# Patient Record
Sex: Male | Born: 1935
Health system: Southern US, Community
[De-identification: ages and names within clinical notes are randomized; demographics above are authoritative.]

## PROBLEM LIST (undated history)

## (undated) DIAGNOSIS — Z950 Presence of cardiac pacemaker: Secondary | ICD-10-CM

## (undated) DIAGNOSIS — N281 Cyst of kidney, acquired: Secondary | ICD-10-CM

## (undated) DIAGNOSIS — N401 Enlarged prostate with lower urinary tract symptoms: Secondary | ICD-10-CM

## (undated) DIAGNOSIS — I2510 Atherosclerotic heart disease of native coronary artery without angina pectoris: Secondary | ICD-10-CM

## (undated) DIAGNOSIS — H9190 Unspecified hearing loss, unspecified ear: Secondary | ICD-10-CM

## (undated) DIAGNOSIS — E119 Type 2 diabetes mellitus without complications: Secondary | ICD-10-CM

## (undated) DIAGNOSIS — R197 Diarrhea, unspecified: Secondary | ICD-10-CM

## (undated) DIAGNOSIS — N179 Acute kidney failure, unspecified: Secondary | ICD-10-CM

## (undated) DIAGNOSIS — K5792 Diverticulitis of intestine, part unspecified, without perforation or abscess without bleeding: Secondary | ICD-10-CM

## (undated) DIAGNOSIS — K219 Gastro-esophageal reflux disease without esophagitis: Secondary | ICD-10-CM

## (undated) DIAGNOSIS — R011 Cardiac murmur, unspecified: Secondary | ICD-10-CM

## (undated) DIAGNOSIS — M1990 Unspecified osteoarthritis, unspecified site: Secondary | ICD-10-CM

## (undated) DIAGNOSIS — D649 Anemia, unspecified: Secondary | ICD-10-CM

## (undated) DIAGNOSIS — E7800 Pure hypercholesterolemia, unspecified: Secondary | ICD-10-CM

## (undated) DIAGNOSIS — M65332 Trigger finger, left middle finger: Secondary | ICD-10-CM

## (undated) DIAGNOSIS — E669 Obesity, unspecified: Secondary | ICD-10-CM

## (undated) DIAGNOSIS — M79603 Pain in arm, unspecified: Secondary | ICD-10-CM

## (undated) DIAGNOSIS — Z973 Presence of spectacles and contact lenses: Secondary | ICD-10-CM

## (undated) DIAGNOSIS — N184 Chronic kidney disease, stage 4 (severe): Secondary | ICD-10-CM

## (undated) DIAGNOSIS — K3532 Acute appendicitis with perforation and localized peritonitis, without abscess: Secondary | ICD-10-CM

## (undated) DIAGNOSIS — I517 Cardiomegaly: Secondary | ICD-10-CM

## (undated) DIAGNOSIS — E1142 Type 2 diabetes mellitus with diabetic polyneuropathy: Secondary | ICD-10-CM

## (undated) DIAGNOSIS — Z95810 Presence of automatic (implantable) cardiac defibrillator: Secondary | ICD-10-CM

## (undated) DIAGNOSIS — M109 Gout, unspecified: Secondary | ICD-10-CM

## (undated) DIAGNOSIS — I442 Atrioventricular block, complete: Secondary | ICD-10-CM

## (undated) DIAGNOSIS — I219 Acute myocardial infarction, unspecified: Secondary | ICD-10-CM

## (undated) DIAGNOSIS — G4730 Sleep apnea, unspecified: Secondary | ICD-10-CM

## (undated) DIAGNOSIS — R040 Epistaxis: Secondary | ICD-10-CM

## (undated) DIAGNOSIS — Z974 Presence of external hearing-aid: Secondary | ICD-10-CM

## (undated) DIAGNOSIS — G459 Transient cerebral ischemic attack, unspecified: Secondary | ICD-10-CM

## (undated) DIAGNOSIS — F32A Depression, unspecified: Secondary | ICD-10-CM

## (undated) DIAGNOSIS — F329 Major depressive disorder, single episode, unspecified: Secondary | ICD-10-CM

## (undated) DIAGNOSIS — J449 Chronic obstructive pulmonary disease, unspecified: Secondary | ICD-10-CM

## (undated) DIAGNOSIS — I255 Ischemic cardiomyopathy: Secondary | ICD-10-CM

## (undated) DIAGNOSIS — Z8719 Personal history of other diseases of the digestive system: Secondary | ICD-10-CM

## (undated) DIAGNOSIS — R0609 Other forms of dyspnea: Secondary | ICD-10-CM

## (undated) DIAGNOSIS — K8020 Calculus of gallbladder without cholecystitis without obstruction: Secondary | ICD-10-CM

## (undated) DIAGNOSIS — G510 Bell's palsy: Secondary | ICD-10-CM

## (undated) DIAGNOSIS — F419 Anxiety disorder, unspecified: Secondary | ICD-10-CM

## (undated) DIAGNOSIS — I5022 Chronic systolic (congestive) heart failure: Secondary | ICD-10-CM

## (undated) DIAGNOSIS — K08109 Complete loss of teeth, unspecified cause, unspecified class: Secondary | ICD-10-CM

## (undated) DIAGNOSIS — I209 Angina pectoris, unspecified: Secondary | ICD-10-CM

## (undated) DIAGNOSIS — R351 Nocturia: Secondary | ICD-10-CM

## (undated) DIAGNOSIS — G629 Polyneuropathy, unspecified: Secondary | ICD-10-CM

## (undated) DIAGNOSIS — Z8619 Personal history of other infectious and parasitic diseases: Secondary | ICD-10-CM

## (undated) DIAGNOSIS — I10 Essential (primary) hypertension: Secondary | ICD-10-CM

## (undated) HISTORY — DX: Major depressive disorder, single episode, unspecified: F32.9

## (undated) HISTORY — DX: Obesity, unspecified: E66.9

## (undated) HISTORY — DX: Sleep apnea, unspecified: G47.30

## (undated) HISTORY — DX: Presence of cardiac pacemaker: Z95.0

## (undated) HISTORY — PX: UPPER GI ENDOSCOPY: SHX6162

## (undated) HISTORY — DX: Essential (primary) hypertension: I10

## (undated) HISTORY — PX: APPENDECTOMY: SHX54

## (undated) HISTORY — DX: Chronic obstructive pulmonary disease, unspecified: J44.9

## (undated) HISTORY — DX: Atrioventricular block, complete: I44.2

## (undated) HISTORY — PX: DENTAL SURGERY: SHX609

## (undated) HISTORY — PX: TONSILLECTOMY: SUR1361

## (undated) HISTORY — DX: Pure hypercholesterolemia, unspecified: E78.00

## (undated) HISTORY — DX: Bell's palsy: G51.0

## (undated) HISTORY — PX: CATARACT EXTRACTION W/ INTRAOCULAR LENS  IMPLANT, BILATERAL: SHX1307

## (undated) HISTORY — PX: COLONOSCOPY: SHX174

## (undated) HISTORY — DX: Atherosclerotic heart disease of native coronary artery without angina pectoris: I25.10

## (undated) HISTORY — DX: Depression, unspecified: F32.A

---

## 1975-07-14 HISTORY — PX: HIATAL HERNIA REPAIR: SHX195

## 1993-07-13 HISTORY — PX: CARDIAC CATHETERIZATION: SHX172

## 2005-10-15 ENCOUNTER — Ambulatory Visit (HOSPITAL_COMMUNITY): Admission: RE | Admit: 2005-10-15 | Discharge: 2005-10-15 | Payer: Self-pay | Admitting: Family Medicine

## 2008-07-13 HISTORY — PX: INSERT / REPLACE / REMOVE PACEMAKER: SUR710

## 2008-07-23 ENCOUNTER — Inpatient Hospital Stay (HOSPITAL_COMMUNITY): Admission: EM | Admit: 2008-07-23 | Discharge: 2008-07-25 | Payer: Self-pay | Admitting: Emergency Medicine

## 2008-09-17 ENCOUNTER — Ambulatory Visit (HOSPITAL_COMMUNITY): Admission: RE | Admit: 2008-09-17 | Discharge: 2008-09-17 | Payer: Self-pay | Admitting: Cardiology

## 2008-09-17 ENCOUNTER — Encounter: Admission: RE | Admit: 2008-09-17 | Discharge: 2008-09-17 | Payer: Self-pay | Admitting: Cardiology

## 2008-09-24 ENCOUNTER — Ambulatory Visit: Admission: RE | Admit: 2008-09-24 | Discharge: 2008-09-24 | Payer: Self-pay | Admitting: Cardiology

## 2008-09-24 LAB — PULMONARY FUNCTION TEST

## 2010-04-05 ENCOUNTER — Encounter: Payer: Self-pay | Admitting: Internal Medicine

## 2010-04-06 ENCOUNTER — Ambulatory Visit: Payer: Self-pay | Admitting: Family Medicine

## 2010-04-06 ENCOUNTER — Inpatient Hospital Stay (HOSPITAL_COMMUNITY): Admission: EM | Admit: 2010-04-06 | Discharge: 2010-04-08 | Payer: Self-pay | Admitting: Emergency Medicine

## 2010-04-06 ENCOUNTER — Ambulatory Visit: Payer: Self-pay | Admitting: Cardiovascular Disease

## 2010-04-07 ENCOUNTER — Ambulatory Visit: Payer: Self-pay | Admitting: Vascular Surgery

## 2010-04-07 ENCOUNTER — Encounter: Payer: Self-pay | Admitting: Family Medicine

## 2010-05-06 ENCOUNTER — Encounter (INDEPENDENT_AMBULATORY_CARE_PROVIDER_SITE_OTHER): Payer: Self-pay | Admitting: *Deleted

## 2010-05-15 ENCOUNTER — Telehealth (INDEPENDENT_AMBULATORY_CARE_PROVIDER_SITE_OTHER): Payer: Self-pay | Admitting: *Deleted

## 2010-05-26 ENCOUNTER — Encounter
Admission: RE | Admit: 2010-05-26 | Discharge: 2010-05-26 | Payer: Self-pay | Source: Home / Self Care | Attending: Internal Medicine | Admitting: Internal Medicine

## 2010-06-02 ENCOUNTER — Encounter (INDEPENDENT_AMBULATORY_CARE_PROVIDER_SITE_OTHER): Payer: Self-pay | Admitting: *Deleted

## 2010-06-06 ENCOUNTER — Telehealth (INDEPENDENT_AMBULATORY_CARE_PROVIDER_SITE_OTHER): Payer: Self-pay | Admitting: *Deleted

## 2010-07-23 ENCOUNTER — Encounter (INDEPENDENT_AMBULATORY_CARE_PROVIDER_SITE_OTHER): Payer: Self-pay | Admitting: *Deleted

## 2010-07-29 ENCOUNTER — Encounter: Payer: Self-pay | Admitting: Internal Medicine

## 2010-08-03 ENCOUNTER — Encounter: Payer: Self-pay | Admitting: Family Medicine

## 2010-08-03 ENCOUNTER — Encounter: Payer: Self-pay | Admitting: Internal Medicine

## 2010-08-03 ENCOUNTER — Encounter: Payer: Self-pay | Admitting: Cardiology

## 2010-08-12 NOTE — Progress Notes (Signed)
  Phone Note Call from Patient   Caller: Patient Summary of Call: last message wrong pt, he does not live in [place], this pt doesn't want to leave dr [name] lela expalined that's not the case that it's only for the device clinic, still didn't understand so she transferred him to dr jordan's office so they could explain, and hoepfully pt or dr Doug Sou office will call to schedule Initial call taken by: Lorenda Hatchet,  May 15, 2010 3:20 PM

## 2010-08-12 NOTE — Letter (Signed)
Summary: Appointment - Missed  [facility], [place]  [address] [place]   [place], [place] [address]   Phone: [phone]  Fax: [phone]     May 06, 2010 MRN: [id number]   CALAN OTANEZ [address] [place], [place]  [address]   Dear Mr. KUREK,  [facility] records indicate you missed your appointment on 04-21-10  with Dr.  Lovena Le.                                    It is very important that we reach you to reschedule this appointment. We look forward to participating in your health care needs. Please contact us at the number listed above at your earliest convenience to reschedule this appointment.     Sincerely,    Public relations account executive

## 2010-08-12 NOTE — Progress Notes (Signed)
  Phone Note Call from Patient   Caller: Patient Summary of Call: PT CALLED IN RESPONSE TO THE LETTER RE MISSED APPT FOR [facility], HE WANTS TO CALL HIS INSURANCE CO AND FIND OUT EXACTLY HOW MUCH HE WILL OWE, THEN CALL TO SCHEDULE Initial call taken by: Lorenda Hatchet,  June 06, 2010 2:58 PM

## 2010-08-12 NOTE — Miscellaneous (Signed)
Summary: Device preload  Clinical Lists Changes  Observations: Added new observation of PPM INDICATN: CHB (04/05/2010 13:25) Added new observation of MAGNET RTE: BOL 85 ERI 65 (04/05/2010 13:25) Added new observation of PPMLEADSTAT2: active (04/05/2010 13:25) Added new observation of PPMLEADSER2: [id number] (04/05/2010 13:25) Added new observation of PPMLEADMOD2: 5076  (04/05/2010 13:25) Added new observation of PPMLEADDOI2: 07/24/2008  (04/05/2010 13:25) Added new observation of PPMLEADLOC2: RV  (04/05/2010 13:25) Added new observation of PPMLEADSTAT1: active  (04/05/2010 13:25) Added new observation of PPMLEADSER1: [id number]  (04/05/2010 13:25) Added new observation of PPMLEADMOD1: 5076  (04/05/2010 13:25) Added new observation of PPMLEADDOI1: 07/24/2008  (04/05/2010 13:25) Added new observation of PPMLEADLOC1: RA  (04/05/2010 13:25) Added new observation of PPM IMP MD: Sinda Du  (04/05/2010 13:25) Added new observation of PPM DOI: 07/24/2008  (04/05/2010 13:25) Added new observation of PPM SERL#: [id number]  (04/05/2010 13:25) Added new observation of PPM MODL#: [id number]  (04/05/2010 13:25) Added new observation of PACEMAKERMFG: Medtronic  (04/05/2010 13:25) Added new observation of PPM REFER MD: Geanie Cooley  (04/05/2010 13:25) Added new observation of PACEMAKER MD: Virl Axe, MD  (04/05/2010 13:25)      PPM Specifications Following MD:  Virl Axe, MD     Referring MD:  Geanie Cooley PPM Vendor:  Medtronic     PPM Model Number:  [id number]     PPM Serial Number:  [id number] PPM DOI:  07/24/2008     PPM Implanting MD:  Sinda Du  Lead 1    Location: RA     DOI: 07/24/2008     Model #: [id number]     Serial #: [id number]     Status: active Lead 2    Location: RV     DOI: 07/24/2008     Model #: [id number]     Serial #: [id number]     Status: active  Magnet Response Rate:  BOL 85 ERI 65  Indications:  CHB

## 2010-08-12 NOTE — Letter (Signed)
Summary: Appointment - Missed  [facility], [place]  [address] [place]   [place], [place] [address]   Phone: [phone]  Fax: [phone]     June 02, 2010 MRN: [id number]   Rodney Farley [address] [place], [place]  [address]   Dear Mr. LALLO,  [facility] records indicate you missed your appointment on 04-21-10  with the [facility]. It is very important that we reach you to reschedule this appointment. We look forward to participating in your health care needs, as we are treating Dr. Doug Sou device patients here at our office now, they will not be doing them at his office any longer. Please contact us at the number listed above at your earliest convenience to reschedule this appointment.     Sincerely,    Public relations account executive

## 2010-08-12 NOTE — Progress Notes (Signed)
  Phone Note Call from Patient   Caller: Patient Summary of Call: pt called back re missed appt on 10-10 for device clinic appt, pt has moved to [facility] Initial call taken by: Lorenda Hatchet,  May 15, 2010 3:11 PM

## 2010-08-14 NOTE — Letter (Signed)
Summary: Device-Delinquent Check  [facility], [place] [address] [place]   [place], [place] [address]   Phone: [phone]  Fax: [phone]     July 23, 2010 MRN: [id number]   JAIVEN SANDUSKY [address] [place], [place]  [address]   Dear Mr. PIZARRO,  According to our records, you have not had your implanted device checked in the recommended period of time.  We are unable to determine appropriate device function without checking your device on a regular basis.  Please call our office to schedule an appointment, with Dr Caryl Comes,  as soon as possible.  If you are having your device checked by another physician, please call us so that we may update our records.  Thank you,  Gasper Sells, EMT  July 23, 2010 4:27 PM  [facility]  certified

## 2010-08-14 NOTE — Cardiovascular Report (Signed)
Summary: Certified Letter Signed - Patient (No Appt. No Response)  Certified Letter Signed - Patient (No Appt. No Response)   Imported By: Ranell Patrick 08/08/2010 12:19:35  _____________________________________________________________________  External Attachment:    Type:   Image     Comment:   External Document

## 2010-09-25 LAB — COMPREHENSIVE METABOLIC PANEL
AST: 29 U/L (ref 0–37)
Creatinine, Ser: 1.35 mg/dL (ref 0.4–1.5)
Sodium: 134 mEq/L — ABNORMAL LOW (ref 135–145)
Total Bilirubin: 0.5 mg/dL (ref 0.3–1.2)

## 2010-09-25 LAB — GLUCOSE, CAPILLARY
Glucose-Capillary: 192 mg/dL — ABNORMAL HIGH (ref 70–99)
Glucose-Capillary: 257 mg/dL — ABNORMAL HIGH (ref 70–99)
Glucose-Capillary: 326 mg/dL — ABNORMAL HIGH (ref 70–99)
Glucose-Capillary: 414 mg/dL — ABNORMAL HIGH (ref 70–99)

## 2010-09-25 LAB — DIFFERENTIAL
Basophils Relative: 1 % (ref 0–1)
Eosinophils Absolute: 0.2 10*3/uL (ref 0.0–0.7)
Eosinophils Relative: 2 % (ref 0–5)
Lymphocytes Relative: 33 % (ref 12–46)
Lymphs Abs: 2.3 10*3/uL (ref 0.7–4.0)
Monocytes Relative: 8 % (ref 3–12)
Neutrophils Relative %: 56 % (ref 43–77)

## 2010-09-25 LAB — URINALYSIS, ROUTINE W REFLEX MICROSCOPIC
Glucose, UA: >1000 mg/dL — AB
Leukocytes, UA: NEGATIVE
Protein, ur: 100 mg/dL — AB
Specific Gravity, Urine: 1.025 (ref 1.005–1.030)
Urobilinogen, UA: 0.2 mg/dL (ref 0.0–1.0)

## 2010-09-25 LAB — APTT: aPTT: 28 seconds (ref 24–37)

## 2010-09-25 LAB — BASIC METABOLIC PANEL
CO2: 24 mEq/L (ref 19–32)
Calcium: 9.5 mg/dL (ref 8.4–10.5)
GFR calc Af Amer: 59 mL/min — ABNORMAL LOW (ref >60)
Potassium: 4.9 mEq/L (ref 3.5–5.1)

## 2010-09-25 LAB — LIPID PANEL
HDL: 44 mg/dL (ref >39)
Total CHOL/HDL Ratio: 5.2 RATIO
Triglycerides: 452 mg/dL — ABNORMAL HIGH (ref <150)
VLDL: UNDETERMINED mg/dL (ref 0–40)

## 2010-09-25 LAB — CBC
HCT: 39.8 % (ref 39.0–52.0)
MCH: 31.3 pg (ref 26.0–34.0)
MCHC: 34.7 g/dL (ref 30.0–36.0)
MCV: 89.4 fL (ref 78.0–100.0)
MCV: 90.2 fL (ref 78.0–100.0)
Platelets: 206 10*3/uL (ref 150–400)
Platelets: 215 10*3/uL (ref 150–400)
RBC: 4.41 MIL/uL (ref 4.22–5.81)
RDW: 12.8 % (ref 11.5–15.5)

## 2010-09-25 LAB — CARDIAC PANEL(CRET KIN+CKTOT+MB+TROPI)
CK, MB: 3.1 ng/mL (ref 0.3–4.0)
Total CK: 125 U/L (ref 7–232)

## 2010-09-25 LAB — CK TOTAL AND CKMB (NOT AT ARMC)
Relative Index: 3.2 — ABNORMAL HIGH (ref 0.0–2.5)
Total CK: 130 U/L (ref 7–232)

## 2010-09-25 LAB — HEMOGLOBIN A1C: Hgb A1c MFr Bld: 12.5 % — ABNORMAL HIGH (ref <5.7)

## 2010-09-25 LAB — URINE MICROSCOPIC-ADD ON

## 2010-10-27 LAB — COMPREHENSIVE METABOLIC PANEL
ALT: 60 U/L — ABNORMAL HIGH (ref 0–53)
Alkaline Phosphatase: 40 U/L (ref 39–117)
BUN: 33 mg/dL — ABNORMAL HIGH (ref 6–23)
CO2: 25 mEq/L (ref 19–32)
Chloride: 105 mEq/L (ref 96–112)
GFR calc non Af Amer: 38 mL/min — ABNORMAL LOW (ref >60)
Glucose, Bld: 305 mg/dL — ABNORMAL HIGH (ref 70–99)
Potassium: 5.1 mEq/L (ref 3.5–5.1)
Total Bilirubin: 0.8 mg/dL (ref 0.3–1.2)
Total Protein: 6.3 g/dL (ref 6.0–8.3)

## 2010-10-27 LAB — BASIC METABOLIC PANEL
CO2: 23 mEq/L (ref 19–32)
CO2: 27 mEq/L (ref 19–32)
Chloride: 102 mEq/L (ref 96–112)
Chloride: 104 mEq/L (ref 96–112)
Creatinine, Ser: 1.94 mg/dL — ABNORMAL HIGH (ref 0.4–1.5)
GFR calc Af Amer: 41 mL/min — ABNORMAL LOW (ref >60)
GFR calc Af Amer: 57 mL/min — ABNORMAL LOW (ref >60)
Potassium: 3.8 mEq/L (ref 3.5–5.1)
Sodium: 135 mEq/L (ref 135–145)
Sodium: 136 mEq/L (ref 135–145)

## 2010-10-27 LAB — HEMOGLOBIN A1C
Hgb A1c MFr Bld: 7.3 % — ABNORMAL HIGH (ref 4.6–6.1)
Mean Plasma Glucose: 163 mg/dL

## 2010-10-27 LAB — BASIC METABOLIC PANEL WITH GFR
BUN: 33 mg/dL — ABNORMAL HIGH (ref 6–23)
Calcium: 9.3 mg/dL (ref 8.4–10.5)
GFR calc non Af Amer: 34 mL/min — ABNORMAL LOW (ref >60)
Glucose, Bld: 388 mg/dL — ABNORMAL HIGH (ref 70–99)
Potassium: 5 meq/L (ref 3.5–5.1)

## 2010-10-27 LAB — COMPREHENSIVE METABOLIC PANEL WITH GFR
AST: 53 U/L — ABNORMAL HIGH (ref 0–37)
Albumin: 3.7 g/dL (ref 3.5–5.2)
Calcium: 9.5 mg/dL (ref 8.4–10.5)
Creatinine, Ser: 1.78 mg/dL — ABNORMAL HIGH (ref 0.4–1.5)
GFR calc Af Amer: 45 mL/min — ABNORMAL LOW (ref >60)
Sodium: 137 meq/L (ref 135–145)

## 2010-10-27 LAB — POCT I-STAT, CHEM 8
BUN: 37 mg/dL — ABNORMAL HIGH (ref 6–23)
Calcium, Ion: 1.24 mmol/L (ref 1.12–1.32)
Chloride: 104 meq/L (ref 96–112)
Creatinine, Ser: 1.9 mg/dL — ABNORMAL HIGH (ref 0.4–1.5)
Glucose, Bld: 374 mg/dL — ABNORMAL HIGH (ref 70–99)
HCT: 42 % (ref 39.0–52.0)
Hemoglobin: 14.3 g/dL (ref 13.0–17.0)
Potassium: 5.1 meq/L (ref 3.5–5.1)
Sodium: 136 meq/L (ref 135–145)
TCO2: 22 mmol/L (ref 0–100)

## 2010-10-27 LAB — DIFFERENTIAL
Basophils Absolute: 0.1 10*3/uL (ref 0.0–0.1)
Basophils Relative: 1 % (ref 0–1)
Eosinophils Absolute: 0.5 K/uL (ref 0.0–0.7)
Eosinophils Relative: 6 % — ABNORMAL HIGH (ref 0–5)
Lymphocytes Relative: 39 % (ref 12–46)
Lymphs Abs: 3.5 K/uL (ref 0.7–4.0)
Monocytes Absolute: 0.7 10*3/uL (ref 0.1–1.0)
Monocytes Relative: 8 % (ref 3–12)
Neutro Abs: 4.1 10*3/uL (ref 1.7–7.7)
Neutrophils Relative %: 47 % (ref 43–77)

## 2010-10-27 LAB — CBC
HCT: 40.7 % (ref 39.0–52.0)
Hemoglobin: 13.6 g/dL (ref 13.0–17.0)
MCHC: 33.3 g/dL (ref 30.0–36.0)
MCV: 95.9 fL (ref 78.0–100.0)
Platelets: 190 K/uL (ref 150–400)
RBC: 4.25 MIL/uL (ref 4.22–5.81)
RDW: 13.5 % (ref 11.5–15.5)
WBC: 8.8 10*3/uL (ref 4.0–10.5)

## 2010-10-27 LAB — GLUCOSE, CAPILLARY
Glucose-Capillary: 117 mg/dL — ABNORMAL HIGH (ref 70–99)
Glucose-Capillary: 129 mg/dL — ABNORMAL HIGH (ref 70–99)
Glucose-Capillary: 137 mg/dL — ABNORMAL HIGH (ref 70–99)
Glucose-Capillary: 153 mg/dL — ABNORMAL HIGH (ref 70–99)
Glucose-Capillary: 154 mg/dL — ABNORMAL HIGH (ref 70–99)
Glucose-Capillary: 162 mg/dL — ABNORMAL HIGH (ref 70–99)
Glucose-Capillary: 208 mg/dL — ABNORMAL HIGH (ref 70–99)
Glucose-Capillary: 285 mg/dL — ABNORMAL HIGH (ref 70–99)

## 2010-10-27 LAB — TSH: TSH: 1.364 u[IU]/mL (ref 0.350–4.500)

## 2010-10-27 LAB — CK TOTAL AND CKMB (NOT AT ARMC)
CK, MB: 3.7 ng/mL (ref 0.3–4.0)
Relative Index: 2.8 — ABNORMAL HIGH (ref 0.0–2.5)
Total CK: 130 U/L (ref 7–232)

## 2010-10-27 LAB — MAGNESIUM: Magnesium: 1.7 mg/dL (ref 1.5–2.5)

## 2010-10-27 LAB — PROTIME-INR
INR: 1 (ref 0.00–1.49)
Prothrombin Time: 13.4 seconds (ref 11.6–15.2)

## 2010-10-27 LAB — TROPONIN I: Troponin I: 0.02 ng/mL (ref 0.00–0.06)

## 2010-10-27 LAB — LIPID PANEL
Triglycerides: 435 mg/dL — ABNORMAL HIGH (ref <150)
VLDL: UNDETERMINED mg/dL (ref 0–40)

## 2010-10-27 LAB — CARDIAC PANEL(CRET KIN+CKTOT+MB+TROPI)
CK, MB: 6.3 ng/mL — ABNORMAL HIGH (ref 0.3–4.0)
Total CK: 182 U/L (ref 7–232)
Troponin I: 0.01 ng/mL (ref 0.00–0.06)

## 2010-10-27 LAB — APTT: aPTT: 27 seconds (ref 24–37)

## 2010-11-13 ENCOUNTER — Encounter: Payer: Self-pay | Admitting: *Deleted

## 2010-11-25 NOTE — Cardiovascular Report (Signed)
NAME:  Rodney Farley, SWENSON NO.:  [id number]   MEDICAL RECORD NO.:  [id number]          PATIENT TYPE:  INP   LOCATION:  2807                         FACILITY:  [facility]   PHYSICIAN:  Ercil M. [name], M.D.  DATE OF BIRTH:  March 14, 1932   DATE OF PROCEDURE:  07/23/2008  DATE OF DISCHARGE:                            CARDIAC CATHETERIZATION   INDICATIONS FOR PROCEDURE:  A 77 year old white male presented with  syncope and complete heart block with a rate of 20 beats per minute.  Access is via the right femoral vein using the standard Seldinger  technique equipment, 6-French venous sheath, and 5-French balloon-tipped  temporary pacemaker wire.   MEDICATIONS:  Local anesthesia with 1% Xylocaine.   PROCEDURE NOTE:  After the patient was sterilely prepped and draped, his  right groin was anesthetized with lidocaine.  We introduced a femoral  venous sheath.  Under fluoroscopic guidance, the temporary pacing wire  was directed into the right ventricular apex.  This demonstrated good  capture with down to 2 mA.  The patient was left with a rate of 70 beats  per minute and output of 6 mA on his temporary pacemaker.  He tolerated  procedure well without complications.   INTERPRETATION:  Successful insertion of temporary transvenous  pacemaker.           ______________________________  Alize M. [name], M.D.     PMJ/MEDQ  D:  07/23/2008  T:  07/24/2008  Job:  [id number]

## 2010-11-25 NOTE — H&P (Signed)
NAME:  Rodney Farley, Rodney Farley NO.:  [id number]   MEDICAL RECORD NO.:  [id number]          PATIENT TYPE:  INP   LOCATION:  2908                         FACILITY:  [facility]   PHYSICIAN:  Linden M. [name], M.D.  DATE OF BIRTH:  [birth year]   DATE OF ADMISSION:  07/23/2008  DATE OF DISCHARGE:                              HISTORY & PHYSICAL   HISTORY OF PRESENT ILLNESS:  Rodney Farley is a 77 year old white male  who has a known history of coronary disease.  He apparently suffered two  myocardial infarctions in 1995 and was treated with angioplasty of one  vessel.  His last cardiac evaluation and followup was in May 2006.  He  does have a history of left bundle-branch block.  Approximately 10 days  ago, he had an episode where he felt like he was going to black count.  He felt extremely weak.  He just went to bed and his wife noted that he  was confused and disoriented.  He did not seek medical attention at that  time.  Today, the patient developed recurrent episodes of syncope.  The  patient's wife reports he blacked out several times.  She states he  turned blue gray on several occasions.  In between, he was very weak and  was down on his hands and knees, retching with nausea and vomiting.  With this retching, he repeatedly hit his head on a door that was  directly in front of him.  He denied any chest pain or shortness of  breath.  On arrival in the emergency department, the patient was found  to be in complete heart block with a rate of 20 beats per minute.  He  was given IV epinephrine and atropine without any benefit.  He is  mentally awake and alert.   PAST MEDICAL HISTORY:  1. Status post myocardial infarction in 1995, treated with      angioplasty.  He had an adenosine Cardiolite study in May 2006      which showed anterior septal and apical infarct without evidence of      ischemia.  His ejection fraction was 43%.  2. Hypertension.  3. Hypercholesterolemia.  4.  History of left bundle-branch block.  5. History of hiatal hernia repair.  6. History of tonsillitis.   ALLERGIES:  He has been intolerant of SIMVASTATIN and ZETIA.  He is  currently on pravastatin and tolerating this well.   His current medications include;  1. Pravastatin 80 mg per day.  2. Atenolol 100 mg daily.  3. Terazosin 2 mg daily.  4. Lisinopril 40 mg per day.  5. Metformin 850 mg b.i.d.   SOCIAL HISTORY:  The patient quit smoking in December 2008.  He had a 50-  pack-year prior to that.  He drinks occasional alcohol.  He is married  and has one son.   FAMILY HISTORY:  Father died with leukemia.  Mother died with stroke.  Two brothers died with myocardial infarction.  One sister died with  stroke.   REVIEW OF SYSTEMS:  The patient denies any headache or visual changes.  He  is currently without nausea or vomiting.  Denies any change in bowel  or bladder habits recently.  Denies any claudication, edema, orthopnea,  or PND.  He has had no chest pain.  All other systems are reviewed and  are negative.   PHYSICAL EXAMINATION:  GENERAL:  Elderly white male who appears anxious.  VITAL SIGNS:  His blood pressure is 96/50, His pulse is 20, respirations  are 20.  He is afebrile.  HEENT:  He has a small laceration in mid forehead.  His nose is bruised.  His oropharynx is edentulous and clear.  NECK:  Supple without JVD, adenopathy, thyromegaly, or bruits.  LUNGS:  Clear to auscultation and percussion.  CARDIAC:  A very slow pulse.  There are no audible murmur or rub.  ABDOMEN:  Soft and nontender without mass or bruits.  EXTREMITIES:  Cool.  Pulses are 2+ and very slow, but easily palpable.  NEUROLOGIC:  He is alert and oriented x4.  He has no focal findings.  His cranial nerves are intact.   LABORATORY DATA:  ECG shows normal sinus rhythm with a third-degree AV  block and a slow ventricular response with right bundle-branch block  pattern.   IMPRESSION:  1. Third-degree  atrioventricular block with syncope.  2. Coronary artery disease with remote anterior septal myocardial      infarction.  3. History of left bundle-branch block.  4. Diabetes mellitus type 2.  5. Hypertension.  6. Hypercholesterolemia.   PLAN:  We will proceed with emergent placement of transvenous pacemaker.  The patient is not responding to medications or to his old pacer.  We  will admit for rule out myocardial infarction.  The patient will need  permanent pacemaker implant when stable.  We will place him on sliding  scale insulin.  We will continue his aspirin and continue his  antihypertensive therapy and pravastatin with the exception of his  atenolol.  Once stabilize, we will reassess his LV function with  echocardiogram.           ______________________________  [name], M.D.     PMJ/MEDQ  D:  07/23/2008  T:  07/24/2008  Job:  [id number]   cc:   Linton Ham. [name], M.D.

## 2010-11-25 NOTE — Discharge Summary (Signed)
NAME:  Rodney Farley, Rodney Farley NO.:  [id number]   MEDICAL RECORD NO.:  [id number]          PATIENT TYPE:  INP   LOCATION:  2908                         FACILITY:  [facility]   PHYSICIAN:  Stanislaus M. [name], M.D.  DATE OF BIRTH:  Dec 12, 1931   DATE OF ADMISSION:  07/23/2008  DATE OF DISCHARGE:  07/25/2008                               DISCHARGE SUMMARY   HISTORY OF PRESENT ILLNESS:  Mr. Bencosme is a 77 year old white male  with known history of coronary artery disease.  He had acute myocardial  infarction in 1995, was treated with angioplasty.  His last Cardiolite  study in May 2006 showed anterior septal and apical infarct with an  ejection fraction of 43%.  He has a history of hypertension,  hypercholesterolemia, and left bundle-branch block.  He also has  diabetes mellitus type 2.  The patient 10 days prior to admission had an  episode where he felt extremely weak and like he was going to pass out.  His wife noted that he was confused and disoriented.  He did not seek  medical attention at that time.  On the day of admission, the patient  developed recurrent syncopal episodes associated with nausea, vomiting,  retching, extreme weakness, and the patient during some of these  retching episodes hit his head against the door in front of him.  He  denied any chest pain or shortness of breath.  When he presented to the  emergency department, he was found to be in complete heart block with a  ventricular escape rhythm at a rate of 20 beats per minute.  He was  hypotensive.  He did not respond to atropine or IV epinephrine.   For details of his past medical history, social history, family history,  and physical exam, please see admission history and physical.   LABORATORY DATA:  Initial chest x-ray showed cardiomegaly with no acute  findings.  His white count was 8800, hemoglobin 13.6, hematocrit 40.7,  platelets 190,000.  Sodium is 136, potassium 5.0, chloride 104, CO2 of  23, BUN  33, creatinine 1.94, and glucose of 388.  His coags were normal.  AST was elevated at 53, ALT of 60.  Alkaline phosphatase, total bili,  and albumin were normal.  Serial cardiac enzymes were negative for  myocardial infarction.  Total cholesterol is 240, triglycerides 435, HDL  was 37, could not calculate LDL due to the elevated triglycerides,  calcium levels normal at 9.3, magnesium 1.7.  A1c was 7.3%.  TSH was  1.364.  ECG showed normal sinus rhythm with complete heart block and a  slow ventricular escape rhythm with a right bundle-branch block pattern.   HOSPITAL COURSE:  The patient was taken emergently to the cardiac  catheterization laboratory where he had a temporary pacemaker inserted  via the right femoral vein.  This resulted in stabilization of his blood  pressure.  His symptoms resolved.  He had improvement in his renal  indices with improved perfusion with a BUN of 30 and creatinine of 1.46.  His glycemic control improved with sliding scale insulin.  His metformin  was held  during his hospital stay.  His atenolol was held initially.  Subsequently, he exhibited no symptoms with headache, visual changes, or  any other symptoms of significant head confusion.  He did have an  abrasion on his forehead.  There was no evidence of spinal injury.  The  patient had no neck pain.  On July 24, 2008, the patient underwent  placement of a DDDR pacemaker by Dr. Angelena Sole.  This was a Medtronic  Adapter L pacemaker, serial number [id number].  The patient tolerated  the procedure very well.  He had no complications at his pacer site.  He  was treated with antibiotics for 24 hours.  His followup chest x-ray the  next morning showed good pacemaker location with no pneumothorax and  lungs were clear.  The patient was subsequently resumed on his prior  medications.  It was recommended that he have followup with his primary  physician in a couple of weeks to reassess his lipid control and   diabetes control.  He was discharged home in stable condition on July 25, 2008.   DISCHARGE DIAGNOSES:  1. Complete heart block, symptomatic with no recurrent syncope.  2. Coronary disease with remote anterior septal myocardial infarction.  3. Diabetes mellitus, type 2.  4. Hypertension.  5. Dyslipidemia.  6. History of left bundle-branch block.   DISCHARGE MEDICATIONS:  1. Pravastatin 80 mg per day.  2. Atenolol 100 mg daily.  3. Terazosin 2 mg daily.  4. Lisinopril 40 mg per day.  5. Metformin 850 mg b.i.d.  6. Aspirin 81 mg per day.  7. He was given Percocet 5/325 mg every 6 hours p.r.n. pain.   The patient was given routine post pacemaker instructions.  He is not to  drive for 1 week.  He was given instructions on his arm activity and  wound care.  His discharge status is improved.           ______________________________  Jaret M. [name], M.D.     PMJ/MEDQ  D:  07/25/2008  T:  07/25/2008  Job:  [id number]   cc:   Linton Ham. [name], M.D.

## 2011-01-22 ENCOUNTER — Encounter: Payer: Self-pay | Admitting: *Deleted

## 2011-02-11 ENCOUNTER — Ambulatory Visit: Payer: Self-pay | Admitting: Cardiology

## 2011-04-15 ENCOUNTER — Encounter (INDEPENDENT_AMBULATORY_CARE_PROVIDER_SITE_OTHER): Payer: PRIVATE HEALTH INSURANCE | Admitting: *Deleted

## 2011-04-15 ENCOUNTER — Other Ambulatory Visit: Payer: Self-pay | Admitting: Internal Medicine

## 2011-04-15 ENCOUNTER — Encounter: Payer: Self-pay | Admitting: Internal Medicine

## 2011-04-15 DIAGNOSIS — I442 Atrioventricular block, complete: Secondary | ICD-10-CM

## 2011-04-15 LAB — PACEMAKER DEVICE OBSERVATION
AL AMPLITUDE: 4 mv
ATRIAL PACING PM: 95
BAMS-0001: 175 {beats}/min
BATTERY VOLTAGE: 2.8 V
RV LEAD IMPEDENCE PM: 620 Ohm
RV LEAD THRESHOLD: 0.75 V
VENTRICULAR PACING PM: 99

## 2011-09-18 ENCOUNTER — Encounter: Payer: Self-pay | Admitting: Internal Medicine

## 2011-10-25 ENCOUNTER — Other Ambulatory Visit: Payer: Self-pay | Admitting: Internal Medicine

## 2011-11-03 ENCOUNTER — Ambulatory Visit (INDEPENDENT_AMBULATORY_CARE_PROVIDER_SITE_OTHER): Payer: PRIVATE HEALTH INSURANCE | Admitting: Internal Medicine

## 2011-11-03 ENCOUNTER — Encounter: Payer: Self-pay | Admitting: Internal Medicine

## 2011-11-03 VITALS — BP 162/83 | HR 66 | Ht 67.0 in | Wt 229.4 lb

## 2011-11-03 DIAGNOSIS — I442 Atrioventricular block, complete: Secondary | ICD-10-CM

## 2011-11-03 DIAGNOSIS — I2589 Other forms of chronic ischemic heart disease: Secondary | ICD-10-CM

## 2011-11-03 DIAGNOSIS — E669 Obesity, unspecified: Secondary | ICD-10-CM | POA: Insufficient documentation

## 2011-11-03 DIAGNOSIS — Z950 Presence of cardiac pacemaker: Secondary | ICD-10-CM

## 2011-11-03 DIAGNOSIS — G4730 Sleep apnea, unspecified: Secondary | ICD-10-CM

## 2011-11-03 DIAGNOSIS — I255 Ischemic cardiomyopathy: Secondary | ICD-10-CM

## 2011-11-03 LAB — PACEMAKER DEVICE OBSERVATION
AL AMPLITUDE: 2 mv
AL THRESHOLD: 1.125 V
BAMS-0001: 175 {beats}/min
RV LEAD AMPLITUDE: 11.2 mv
RV LEAD IMPEDENCE PM: 603 Ohm
RV LEAD THRESHOLD: 0.75 V

## 2011-11-03 MED ORDER — LOSARTAN POTASSIUM-HCTZ 100-25 MG PO TABS: 1.0000 | ORAL_TABLET | Freq: Every day | ORAL | Status: DC

## 2011-11-03 NOTE — Assessment & Plan Note (Signed)
Concerned aobut DOE but no angina will arrange f/u with Dr Shirlee More and defer to him assessment of coronaary anatomy

## 2011-11-03 NOTE — Assessment & Plan Note (Signed)
Reviewed the importance of weight loss and relation to DM and sleep apnea  Also will arrange for a sleep study

## 2011-11-03 NOTE — Progress Notes (Signed)
  HPI  Rodney Farley is a 78 y.o. male Is seen to establish pacemaker followup. He has a history of complete heart block and underwent pacemaker implantation in 2010.  He also has a history of ischemic heart disease with prior MI. Myoview 2002 showed ejection fraction of 49%. There is evidence of a prior MI but no ischemia.  He also has a history of hypertension and hyperlipidemia.  He denies chest pain and peripheral edema. He does snore. His wife says that he becomes apneic. He has modest exercise intolerance with DOE  His blood pressures in the 150 range or so.   Past Medical History  Diagnosis Date  . COPD (chronic obstructive pulmonary disease)     Severe  . Hypercholesterolemia   . Depression   . Renal insufficiency   . Coronary artery disease   . Hypertension   . Atrioventricular block, complete     Past Surgical History  Procedure Date  . Pacemaker insertion     Complete heart block status post DDD with good function  . Tonsillectomy   . Insert / replace / remove pacemaker     Current Outpatient Prescriptions  Medication Sig Dispense Refill  . aspirin 81 MG tablet Take 81 mg by mouth daily.      . ATENOLOL PO Take by mouth daily.      . fish oil-omega-3 fatty acids 1000 MG capsule Take 1 g by mouth daily.      . insulin aspart (NOVOLOG) 100 UNIT/ML injection Inject 20 Units into the skin daily.      [name] LORazepam (ATIVAN PO) Take 1 mg by mouth as needed.      [name] losartan (COZAAR) 100 MG tablet TAKE 1 TABLET DAILY  30 tablet  1  . metFORMIN (GLUCOPHAGE) 1000 MG tablet Take 1,000 mg by mouth 2 (two) times daily with a meal.      . Multiple Vitamin (MULTIVITAMIN) capsule Take 1 capsule by mouth daily.      . sertraline (ZOLOFT) 25 MG tablet Take 25 mg by mouth daily.      . simvastatin (ZOCOR) 40 MG tablet Take 40 mg by mouth every evening.        No Known Allergies  Review of Systems negative except from HPI and PMH  Physical Exam BP 162/83  Pulse 66  Ht  5\' 7"  (1.702 m)  Wt 229 lb 6.4 oz (104.055 kg)  BMI 35.93 kg/m2 Well developed and well nourished in no acute distress HENT normal E scleral and icterus clear Neck Supple JVP flat; carotids brisk and full Clear to ausculation Regular rate and rhythm, no murmurs gallops or rub Soft with active bowel sounds No clubbing cyanosis none Edema Alert and oriented, grossly normal motor and sensory function Skin Warm and Dry  ECG demonstrates AV pacing at 70

## 2011-11-03 NOTE — Assessment & Plan Note (Signed)
[name] device was interrogated.  The information was reviewed. No changes were made in the programming.    

## 2011-11-03 NOTE — Patient Instructions (Signed)
Your physician has recommended you make the following change in your medication:  1) Stop Cozaar. 2) Start Hyzaar 100/25 mg once daily  Your physician has recommended that you have a sleep study. This test records several body functions during sleep, including: brain activity, eye movement, oxygen and carbon dioxide blood levels, heart rate and rhythm, breathing rate and rhythm, the flow of air through your mouth and nose, snoring, body muscle movements, and chest and belly movement.  Your physician recommends that you schedule a follow-up appointment in: 3 months with Dr. [name].  Remote monitoring is used to monitor your Pacemaker of ICD from home. This monitoring reduces the number of office visits required to check your device to one time per year. It allows [place] to keep an eye on the functioning of your device to ensure it is working properly. You are scheduled for a device check from home on 02/04/12. You may send your transmission at any time that day. If you have a wireless device, the transmission will be sent automatically. After your physician reviews your transmission, you will receive a postcard with your next transmission date.  Your physician wants you to follow-up in: 1 year with Dr. Caryl Comes. You will receive a reminder letter in the mail two months in advance. If you don't receive a letter, please call our office to schedule the follow-up appointment.

## 2011-11-03 NOTE — Assessment & Plan Note (Signed)
Stable post pacing with sinus node dysfuntion and primarily atrial pacing

## 2011-11-04 ENCOUNTER — Other Ambulatory Visit: Payer: Self-pay | Admitting: Internal Medicine

## 2011-11-18 ENCOUNTER — Ambulatory Visit (HOSPITAL_BASED_OUTPATIENT_CLINIC_OR_DEPARTMENT_OTHER): Payer: PRIVATE HEALTH INSURANCE | Attending: Internal Medicine | Admitting: Radiology

## 2011-11-18 VITALS — Ht 67.0 in | Wt 228.0 lb

## 2011-11-18 DIAGNOSIS — R259 Unspecified abnormal involuntary movements: Secondary | ICD-10-CM | POA: Insufficient documentation

## 2011-11-18 DIAGNOSIS — G4733 Obstructive sleep apnea (adult) (pediatric): Secondary | ICD-10-CM

## 2011-11-18 DIAGNOSIS — G4730 Sleep apnea, unspecified: Secondary | ICD-10-CM

## 2011-12-07 DIAGNOSIS — R259 Unspecified abnormal involuntary movements: Secondary | ICD-10-CM

## 2011-12-07 DIAGNOSIS — G4733 Obstructive sleep apnea (adult) (pediatric): Secondary | ICD-10-CM

## 2011-12-07 NOTE — Procedures (Signed)
NAME:  Rodney Farley, CASTNER NO.:  [id number]  MEDICAL RECORD NO.:  [id number]          PATIENT TYPE:  OUT  LOCATION:  SLEEP CENTER                 FACILITY:  [facility]  PHYSICIAN:  Kathee Delton, MD,FCCPDATE OF BIRTH:  09[birth year]16[birth year]1933  DATE OF STUDY:  11/18/2011                           NOCTURNAL POLYSOMNOGRAM  REFERRING PHYSICIAN:  Deboraha Sprang, MD, [facility]  REFERRING PHYSICIAN:  Deboraha Sprang, MD, [facility]  INDICATION FOR STUDY:  Hypersomnia with sleep apnea.  EPWORTH SLEEPINESS SCORE:  13.  SLEEP ARCHITECTURE:  The patient had a total sleep time of 293 minutes with no slow-wave sleep or REM noted.  Sleep onset latency was normal at 5 minutes and sleep efficiency was 89% during the diagnostic portion of the study and 65% during the titration portion of the study.  RESPIRATORY DATA:  The patient underwent a split night protocol where he was found to have 58 obstructive events in the first 127 minutes of sleep.  This gave him an apnea/hypopnea index of 28 events per hour. The events occurred primarily in the supine position and there was loud snoring noted throughout.  By protocol, the patient was fitted with a medium ResMed Quattro fullface mask, and CPAP titration was initiated. At a final pressure of 11 cm of water, the patient appeared to have good control of his events, however, at the very end of the study he turned supine and began to have significant breakthrough events.  OXYGEN DATA:  There was O2 desaturation as low as 85% with the patient's obstructive events.  CARDIAC DATA:  No clinically significant arrhythmias were noted, although the patient did have rare PVCs.  MOVEMENT/PARASOMNIA:  The patient was found to have 176 leg jerks with 6 per hour, resulting in arousal or awakening.  There were no abnormal behaviors noted.  IMPRESSION/RECOMMENDATION: 1. Split night study reveals moderate obstructive sleep apnea/hypopnea     syndrome with an AHI of 28  events per hour and O2 desaturation as     low as 85% during the diagnostic portion of the study.  The patient     was then fitted with a medium ResMed Quattro fullface mask, and     titrated to a final pressure of 11 cm of water, however, there was     significant breakthrough at the very end of the study when the     patient turned supine.  He will obviously need pressure     optimization as an outpatient.  He should also be encouraged to     work aggressively on weight loss. 2. Rare premature ventricular contraction noted, but no clinically     significant arrhythmias were noted. 3. Large numbers of periodic limb movements with significant sleep     disruption.  It is unclear how much of this is     related to the patient's sleep-disordered breathing, or whether he     may also have a primary movement disorder of sleep.  Clinical     correlation is suggested.     Kathee Delton, MD,FCCP Diplomate, [place] Board of Sleep Medicine    [facility]/MEDQ  D:  12/07/2011 13:57:43  T:  12/07/2011 20:12:58  Job:  [id number] 

## 2011-12-15 ENCOUNTER — Telehealth: Payer: Self-pay | Admitting: *Deleted

## 2011-12-15 NOTE — Telephone Encounter (Signed)
Due:  Wed Dec 09, 2011 12:00 AM      Rodney Farley     MRN:  [id number]  DOB:  31-Jan-1932     Pt Home:  [phone]                 Message     Could you please arrange for pt to see Dr Denver Faster ----- Message ----- From: Kathee Delton, MD Sent: 12/09/2011 10:02 AM To: Deboraha Sprang, MD  [name], sorry for the delay on this pt's sleep study. It must have gotten lost in the shuffle somewhere and just popped into my box the end of last week. Had AHI 28/hr, and titrated on cpap to 11cm with breakthru events noted at end of study. Let me know if I can assist. Thanks.                The patient has an appointment with Dr. Gwenette Greet on 01/05/12.

## 2011-12-16 ENCOUNTER — Encounter: Payer: Self-pay | Admitting: *Deleted

## 2011-12-16 NOTE — Telephone Encounter (Signed)
Opened in error

## 2011-12-31 ENCOUNTER — Encounter (HOSPITAL_BASED_OUTPATIENT_CLINIC_OR_DEPARTMENT_OTHER): Payer: Self-pay | Admitting: *Deleted

## 2011-12-31 ENCOUNTER — Emergency Department (HOSPITAL_BASED_OUTPATIENT_CLINIC_OR_DEPARTMENT_OTHER)
Admission: EM | Admit: 2011-12-31 | Discharge: 2011-12-31 | Disposition: A | Discharge status: Home / Self Care | Payer: PRIVATE HEALTH INSURANCE | Attending: Emergency Medicine | Admitting: Emergency Medicine

## 2011-12-31 ENCOUNTER — Telehealth: Payer: Self-pay

## 2011-12-31 DIAGNOSIS — I10 Essential (primary) hypertension: Secondary | ICD-10-CM | POA: Insufficient documentation

## 2011-12-31 DIAGNOSIS — E119 Type 2 diabetes mellitus without complications: Secondary | ICD-10-CM | POA: Insufficient documentation

## 2011-12-31 DIAGNOSIS — E7800 Pure hypercholesterolemia, unspecified: Secondary | ICD-10-CM | POA: Insufficient documentation

## 2011-12-31 DIAGNOSIS — Z950 Presence of cardiac pacemaker: Secondary | ICD-10-CM | POA: Insufficient documentation

## 2011-12-31 DIAGNOSIS — J4489 Other specified chronic obstructive pulmonary disease: Secondary | ICD-10-CM | POA: Insufficient documentation

## 2011-12-31 DIAGNOSIS — E669 Obesity, unspecified: Secondary | ICD-10-CM | POA: Insufficient documentation

## 2011-12-31 DIAGNOSIS — Z794 Long term (current) use of insulin: Secondary | ICD-10-CM | POA: Insufficient documentation

## 2011-12-31 DIAGNOSIS — G4730 Sleep apnea, unspecified: Secondary | ICD-10-CM | POA: Insufficient documentation

## 2011-12-31 DIAGNOSIS — R739 Hyperglycemia, unspecified: Secondary | ICD-10-CM

## 2011-12-31 DIAGNOSIS — F329 Major depressive disorder, single episode, unspecified: Secondary | ICD-10-CM | POA: Insufficient documentation

## 2011-12-31 DIAGNOSIS — J449 Chronic obstructive pulmonary disease, unspecified: Secondary | ICD-10-CM | POA: Insufficient documentation

## 2011-12-31 DIAGNOSIS — I2510 Atherosclerotic heart disease of native coronary artery without angina pectoris: Secondary | ICD-10-CM | POA: Insufficient documentation

## 2011-12-31 DIAGNOSIS — Z87891 Personal history of nicotine dependence: Secondary | ICD-10-CM | POA: Insufficient documentation

## 2011-12-31 DIAGNOSIS — N289 Disorder of kidney and ureter, unspecified: Secondary | ICD-10-CM | POA: Insufficient documentation

## 2011-12-31 DIAGNOSIS — F3289 Other specified depressive episodes: Secondary | ICD-10-CM | POA: Insufficient documentation

## 2011-12-31 LAB — DIFFERENTIAL
Basophils Absolute: 0 10*3/uL (ref 0.0–0.1)
Eosinophils Relative: 4 % (ref 0–5)
Lymphocytes Relative: 31 % (ref 12–46)
Monocytes Absolute: 0.9 10*3/uL (ref 0.1–1.0)
Monocytes Relative: 11 % (ref 3–12)

## 2011-12-31 LAB — COMPREHENSIVE METABOLIC PANEL
AST: 23 U/L (ref 0–37)
BUN: 37 mg/dL — ABNORMAL HIGH (ref 6–23)
CO2: 25 mEq/L (ref 19–32)
Calcium: 9.7 mg/dL (ref 8.4–10.5)
Creatinine, Ser: 1.9 mg/dL — ABNORMAL HIGH (ref 0.50–1.35)
GFR calc Af Amer: 38 mL/min — ABNORMAL LOW (ref >90)
GFR calc non Af Amer: 32 mL/min — ABNORMAL LOW (ref >90)

## 2011-12-31 LAB — URINALYSIS, ROUTINE W REFLEX MICROSCOPIC
Hgb urine dipstick: NEGATIVE
Leukocytes, UA: NEGATIVE
Nitrite: NEGATIVE
Specific Gravity, Urine: 1.015 (ref 1.005–1.030)
Urobilinogen, UA: 0.2 mg/dL (ref 0.0–1.0)

## 2011-12-31 LAB — CBC
HCT: 39 % (ref 39.0–52.0)
MCHC: 35.6 g/dL (ref 30.0–36.0)
MCV: 88.8 fL (ref 78.0–100.0)
RDW: 12.9 % (ref 11.5–15.5)

## 2011-12-31 NOTE — Telephone Encounter (Signed)
I spoke to patients wife again, he is refusing to go to be evaluated. She was advised to call 911 for an ambulance, she will also call a neighbor to help her.

## 2011-12-31 NOTE — Telephone Encounter (Signed)
WIFE STATES PT IS SLEEPING A LOT AND DOES NOT THINK HE IS USING HIS INSULIN PROPERLY, HE IS NOT ACTING RIGHT AT ALL AND IGNORING ALL DUTIES OR SLEEPING    ADVISED WIFE TO TAKE PT TO ER BEING WE CLOSE SOON STATES SHE CANNOT DO THAT DUE TO HER HAVING SURGERY RECENTLY AND HE WILL NOT COMPLY    PLEASE CALL WIFE

## 2011-12-31 NOTE — Discharge Instructions (Signed)
Diabetes, Frequently Asked Questions WHAT IS DIABETES? Most of the food we eat is turned into glucose (sugar). Our bodies use it for energy. The pancreas makes a hormone called insulin. It helps glucose get into the cells of our bodies. When you have diabetes, your body either does not make enough insulin or cannot use its own insulin as well as it should. This causes sugars to build up in your blood. WHAT ARE THE SYMPTOMS OF DIABETES?  Frequent urination.   Excessive thirst.   Unexplained weight loss.   Extreme hunger.   Blurred vision.   Tingling or numbness in hands or feet.   Feeling very tired much of the time.   Dry, itchy skin.   Sores that are slow to heal.   Yeast infections.  WHAT ARE THE TYPES OF DIABETES? Type 1 Diabetes   About 10% of affected people have this type.   Usually occurs before the age of 86.   Usually occurs in thin to normal weight people.  Type 2 Diabetes  About 90% of affected people have this type.   Usually occurs after the age of 74.   Usually occurs in overweight people.   More likely to have:   A family history of diabetes.   A history of diabetes during pregnancy (gestational diabetes).   High blood pressure.   High cholesterol and triglycerides.  Gestational Diabetes  Occurs in about 4% of pregnancies.   Usually goes away after the baby is born.   More likely to occur in women with:   Family history of diabetes.   Previous gestational diabetes.   Obese.   Over 46 years old.  WHAT IS PRE-DIABETES? Pre-diabetes means your blood glucose is higher than normal, but lower than the diabetes range. It also means you are at risk of getting type 2 diabetes and heart disease. If you are told you have pre-diabetes, have your blood glucose checked again in 1 to 2 years. WHAT IS THE TREATMENT FOR DIABETES? Treatment is aimed at keeping blood glucose near normal levels at all times. Learning how to manage this yourself is  important in treating diabetes. Depending on the type of diabetes you have, your treatment will include one or more of the following:  Monitoring your blood glucose.   Meal planning.   Exercise.   Oral medicine (pills) or insulin.  CAN DIABETES BE PREVENTED? With type 1 diabetes, prevention is more difficult, because the triggers that cause it are not yet known. With type 2 diabetes, prevention is more likely, with lifestyle changes:  Maintain a healthy weight.   Eat healthy.   Exercise.  IS THERE A CURE FOR DIABETES? No, there is no cure for diabetes. There is a lot of research going on that is looking for a cure, and progress is being made. Diabetes can be treated and controlled. People with diabetes can manage their diabetes and lead normal, active lives. SHOULD I BE TESTED FOR DIABETES? If you are at least 76 years old, you should be tested for diabetes. You should be tested again every 3 years. If you are 31 or older and overweight, you may want to get tested more often. If you are younger than 79, overweight, and have one or more of the following risk factors, you should be tested:  Family history of diabetes.   Inactive lifestyle.   High blood pressure.  WHAT ARE SOME OTHER SOURCES FOR INFORMATION ON DIABETES? The following organizations may help in your search for  more information on diabetes: National Diabetes Education Program (NDEP) Internet: BloggerBowl.es American Diabetes Association Internet: http://www.diabetes.org  Juvenile Diabetes Foundation International Internet: AffordableSalon.es Document Released: 07/02/2003 Document Revised: 06/18/2011 Document Reviewed: 04/26/2009 [facility] Patient Information 2012 [place].Hyperglycemia Hyperglycemia occurs when the glucose (sugar) in your blood is too high. Hyperglycemia can happen for many reasons, but it most often happens to people who do not know they have diabetes or are not managing  their diabetes properly.  CAUSES  Whether you have diabetes or not, there are other causes of hyperglycemia. Hyperglycemia can occur when you have diabetes, but it can also occur in other situations that you might not be as aware of, such as: Diabetes  If you have diabetes and are having problems controlling your blood glucose, hyperglycemia could occur because of some of the following reasons:   Not following your meal plan.   Not taking your diabetes medications or not taking it properly.   Exercising less or doing less activity than you normally do.   Being sick.  Pre-diabetes  This cannot be ignored. Before people develop Type 2 diabetes, they almost always have "pre-diabetes." This is when your blood glucose levels are higher than normal, but not yet high enough to be diagnosed as diabetes. Research has shown that some long-term damage to the body, especially the heart and circulatory system, may already be occurring during pre-diabetes. If you take action to manage your blood glucose when you have pre-diabetes, you may delay or prevent Type 2 diabetes from developing.  Stress  If you have diabetes, you may be "diet" controlled or on oral medications or insulin to control your diabetes. However, you may find that your blood glucose is higher than usual in the hospital whether you have diabetes or not. This is often referred to as "stress hyperglycemia." Stress can elevate your blood glucose. This happens because of hormones put out by the body during times of stress. If stress has been the cause of your high blood glucose, it can be followed regularly by your caregiver. That way he/she can make sure your hyperglycemia does not continue to get worse or progress to diabetes.  Steroids  Steroids are medications that act on the infection fighting system (immune system) to block inflammation or infection. One side effect can be a rise in blood glucose. Most people can produce enough extra  insulin to allow for this rise, but for those who cannot, steroids make blood glucose levels go even higher. It is not unusual for steroid treatments to "uncover" diabetes that is developing. It is not always possible to determine if the hyperglycemia will go away after the steroids are stopped. A special blood test called an A1c is sometimes done to determine if your blood glucose was elevated before the steroids were started.  SYMPTOMS  Thirsty.   Frequent urination.   Dry mouth.   Blurred vision.   Tired or fatigue.   Weakness.   Sleepy.   Tingling in feet or leg.  DIAGNOSIS  Diagnosis is made by monitoring blood glucose in one or all of the following ways:  A1c test. This is a chemical found in your blood.   Fingerstick blood glucose monitoring.   Laboratory results.  TREATMENT  First, knowing the cause of the hyperglycemia is important before the hyperglycemia can be treated. Treatment may include, but is not be limited to:  Education.   Change or adjustment in medications.   Change or adjustment in meal plan.   Treatment for  an illness, infection, etc.   More frequent blood glucose monitoring.   Change in exercise plan.   Decreasing or stopping steroids.   Lifestyle changes.  HOME CARE INSTRUCTIONS   Test your blood glucose as directed.   Exercise regularly. Your caregiver will give you instructions about exercise. Pre-diabetes or diabetes which comes on with stress is helped by exercising.   Eat wholesome, balanced meals. Eat often and at regular, fixed times. Your caregiver or nutritionist will give you a meal plan to guide your sugar intake.   Being at an ideal weight is important. If needed, losing as little as 10 to 15 pounds may help improve blood glucose levels.  SEEK MEDICAL CARE IF:   You have questions about medicine, activity, or diet.   You continue to have symptoms (problems such as increased thirst, urination, or weight gain).  SEEK  IMMEDIATE MEDICAL CARE IF:   You are vomiting or have diarrhea.   Your breath smells fruity.   You are breathing faster or slower.   You are very sleepy or incoherent.   You have numbness, tingling, or pain in your feet or hands.   You have chest pain.   Your symptoms get worse even though you have been following your caregiver's orders.   If you have any other questions or concerns.  Document Released: 12/23/2000 Document Revised: 06/18/2011 Document Reviewed: 02/18/2009 [facility] Patient Information 2012 [place].

## 2011-12-31 NOTE — Telephone Encounter (Signed)
I spoke to patients wife and she indicates she does not want him to have to go to ER due to financial reasons, I parked her call and spoke to [place], I need to know what his glucose level is. Wife indicated it was 60, then came down to 220. If it is 400 he needs to go to ER, if it is 200 and he is not confused he can come in here in the am, if he is still confused at all he needs to go to ER, I left mssg for his wife to call me back Naveya Ellerman

## 2011-12-31 NOTE — ED Notes (Signed)
Lethargic over several months with numbness in his left arm. States he took his blood sugar and it was 409. He has cut his own Insulin down due to high cost of medication.

## 2011-12-31 NOTE — ED Provider Notes (Addendum)
History     CSN: [id number]  Arrival date & time 12/31/11  2043   First MD Initiated Contact with Patient 12/31/11 2055      Chief Complaint  Patient presents with  . Hyperglycemia    (Consider location/radiation/quality/duration/timing/severity/associated sxs/prior treatment) Patient is a 78 y.o. male presenting with weakness. The history is provided by the patient.  Weakness Primary symptoms do not include headaches, syncope, loss of consciousness, visual change, fever, nausea or vomiting. Primary symptoms comment: generalized fatigue Episode onset: worsening over the last few months. The symptoms are worsening. The neurological symptoms are diffuse. Context: has been using less insulin than he should and not checking blood sugars to save money.  Additional symptoms do not include weakness, leg pain, loss of balance or taste disturbance. Associated symptoms comments: fatigue. Medical issues also include diabetes and hypertension.    Past Medical History  Diagnosis Date  . COPD (chronic obstructive pulmonary disease)     Severe  . Hypercholesterolemia   . Depression   . Renal insufficiency   . Coronary artery disease   . Hypertension   . Atrioventricular block, complete   . Pacemaker     medtronic  . Obesity   . Sleep apnea   . Diabetes mellitus     Past Surgical History  Procedure Date  . Pacemaker insertion     Complete heart block status post DDD with good function  . Tonsillectomy   . Insert / replace / remove pacemaker     No family history on file.  History  Substance Use Topics  . Smoking status: Former Research scientist (life sciences)  . Smokeless tobacco: Not on file  . Alcohol Use: Yes     [place].      Review of Systems  Constitutional: Positive for fatigue. Negative for fever.  Respiratory: Positive for shortness of breath. Negative for cough.   Cardiovascular: Negative for chest pain, leg swelling and syncope.  Gastrointestinal: Negative for nausea, vomiting, abdominal  pain and diarrhea.  Genitourinary: Positive for urgency and frequency. Negative for dysuria and flank pain.  Neurological: Negative for loss of consciousness, weakness, headaches and loss of balance.  All other systems reviewed and are negative.    Allergies  Review of patient's allergies indicates no known allergies.  Home Medications   Current Outpatient Rx  Name Route Sig Dispense Refill  . ASPIRIN 81 MG PO TABS Oral Take 81 mg by mouth daily.    . ATENOLOL-CHLORTHALIDONE 100-25 MG PO TABS Oral Take 1 tablet by mouth daily.    [name] GLUCAGON EMERGENCY IJ Injection Inject 1 Syringe as directed once.    [name] LORATADINE 10 MG PO TABS Oral Take 10 mg by mouth daily.    [name] LOSARTAN POTASSIUM 100 MG PO TABS Oral Take 100 mg by mouth daily.    [name] METFORMIN HCL 1000 MG PO TABS Oral Take 1,000 mg by mouth 2 (two) times daily with a meal.    . TRIPLE ANTIBIOTIC [phone] EX OINT Topical Apply 1 application topically 2 (two) times daily as needed. For irritation    . OMEPRAZOLE 40 MG PO CPDR Oral Take 40 mg by mouth every evening.    [name] RANITIDINE HCL 300 MG PO TABS Oral Take 300 mg by mouth daily.    . SERTRALINE HCL 25 MG PO TABS Oral Take 25 mg by mouth daily.    [name] SIMVASTATIN 40 MG PO TABS Oral Take 40 mg by mouth every evening.    [name] TERAZOSIN HCL 1 MG  PO CAPS Oral Take 1 mg by mouth every evening.    . INSULIN ASPART 100 UNIT/ML [place] SOLN Subcutaneous Inject 20 Units into the skin daily.      BP 172/73  Pulse 67  Temp 98.1 F (36.7 C) (Oral)  Resp 22  SpO2 98%  Physical Exam  Nursing note and vitals reviewed. Constitutional: He is oriented to person, place, and time. He appears well-developed and well-nourished. No distress.  HENT:  Head: Normocephalic and atraumatic.  Mouth/Throat: Oropharynx is clear and moist.  Eyes: Conjunctivae and EOM are normal. Pupils are equal, round, and reactive to light.  Neck: Normal range of motion. Neck supple.  Cardiovascular: Normal rate, regular rhythm  and intact distal pulses.   No murmur heard. Pulmonary/Chest: Effort normal and breath sounds normal. No respiratory distress. He has no wheezes. He has no rales.  Abdominal: Soft. He exhibits no distension. There is no tenderness. There is no rebound and no guarding.  Musculoskeletal: Normal range of motion. He exhibits no edema and no tenderness.  Neurological: He is alert and oriented to person, place, and time.  Skin: Skin is warm and dry. No rash noted. No erythema.  Psychiatric: He has a normal mood and affect. His behavior is normal.    ED Course  Procedures (including critical care time)  Labs Reviewed  GLUCOSE, CAPILLARY - Abnormal; Notable for the following:    Glucose-Capillary 228 (*)     All other components within normal limits  COMPREHENSIVE METABOLIC PANEL - Abnormal; Notable for the following:    Glucose, Bld 237 (*)     BUN 37 (*)     Creatinine, Ser 1.90 (*)     Total Bilirubin 0.2 (*)     GFR calc non Af Amer 32 (*)     GFR calc Af Amer 38 (*)     All other components within normal limits  URINALYSIS, ROUTINE W REFLEX MICROSCOPIC - Abnormal; Notable for the following:    Glucose, UA 100 (*)     All other components within normal limits  CBC  DIFFERENTIAL   No results found.   1. Hyperglycemia       MDM   Recent states that he is felt fatigued and very tired for the last several months. He also states that he's been using less insulin and stopped checking his blood sugar because he was trying to save cost. He denies any pain, fever or cough. He states he's always short of breath due to COPD but that is unchanged. He recently also did a sleep study but has not had the results back. Today before coming patient's blood sugar was over 400 he took 12 units of insulin before coming in on arrival here his blood sugar was 288. Patient has no focal findings on exam and is awake and alert and answering questions and giving medical history appropriately. Feel most  likely his symptoms are due to a combination of sleep apnea and hyperglycemia. However due to his history of renal insufficiency we'll check a CBC and CMP and UA to evaluate for underlying causes such as renal failure, UTI or electrolyte abnormalities causing his symptoms.  10:21 PM Creatinine stable at 1.9. He ranges between 1.5 x 1.9 and the rest of labs are within normal limits. Discussed with patient starting back on his normal insulin regime and following up with Dr. [name] tomorrow.      Blanchie Dessert, MD 12/31/11 [id number]  Blanchie Dessert, MD 12/31/11 2225

## 2011-12-31 NOTE — Telephone Encounter (Signed)
I spoke to patients wife again, she is concerned about his hearing and his cognitive level, I advised her to take him to the Emergency Room tonight. Rodney Farley

## 2011-12-31 NOTE — ED Notes (Signed)
accu check 229.

## 2011-12-31 NOTE — ED Notes (Signed)
MD at bedside. 

## 2012-01-05 ENCOUNTER — Institutional Professional Consult (permissible substitution): Payer: PRIVATE HEALTH INSURANCE | Admitting: Pulmonary Disease

## 2012-01-21 ENCOUNTER — Ambulatory Visit: Payer: PRIVATE HEALTH INSURANCE | Admitting: Cardiology

## 2012-02-01 ENCOUNTER — Other Ambulatory Visit: Payer: Self-pay | Admitting: Physician Assistant

## 2012-02-04 ENCOUNTER — Encounter: Payer: PRIVATE HEALTH INSURANCE | Admitting: *Deleted

## 2012-02-09 ENCOUNTER — Encounter: Payer: Self-pay | Admitting: *Deleted

## 2012-02-15 ENCOUNTER — Telehealth: Payer: Self-pay | Admitting: Cardiology

## 2012-02-15 NOTE — Telephone Encounter (Signed)
New msg Pt got letter about missing transmission and he wants to talk to you about this. Please call

## 2012-02-15 NOTE — Telephone Encounter (Signed)
LMOM in regards to transmission/kwb

## 2012-02-18 ENCOUNTER — Ambulatory Visit (INDEPENDENT_AMBULATORY_CARE_PROVIDER_SITE_OTHER): Payer: PRIVATE HEALTH INSURANCE | Admitting: Cardiology

## 2012-02-18 ENCOUNTER — Encounter: Payer: Self-pay | Admitting: Cardiology

## 2012-02-18 VITALS — Ht 67.0 in | Wt 232.4 lb

## 2012-02-18 DIAGNOSIS — Z950 Presence of cardiac pacemaker: Secondary | ICD-10-CM

## 2012-02-18 DIAGNOSIS — I442 Atrioventricular block, complete: Secondary | ICD-10-CM

## 2012-02-18 LAB — PACEMAKER DEVICE OBSERVATION
AL AMPLITUDE: 2.8 mv
AL IMPEDENCE PM: 483 Ohm
AL THRESHOLD: 1 V
RV LEAD AMPLITUDE: 15.68 mv
RV LEAD IMPEDENCE PM: 617 Ohm
RV LEAD THRESHOLD: 1 V

## 2012-02-18 NOTE — Progress Notes (Signed)
Device check only. See PaceArt report.

## 2012-02-24 NOTE — Telephone Encounter (Signed)
LMOM for pt to return call. Calls restricted from work #. Used Cellphone to return call.

## 2012-02-29 ENCOUNTER — Other Ambulatory Visit: Payer: Self-pay | Admitting: Physician Assistant

## 2012-03-09 ENCOUNTER — Encounter: Payer: Self-pay | Admitting: Internal Medicine

## 2012-03-13 ENCOUNTER — Other Ambulatory Visit: Payer: Self-pay | Admitting: Family Medicine

## 2012-03-24 ENCOUNTER — Other Ambulatory Visit: Payer: Self-pay | Admitting: Family Medicine

## 2012-04-16 ENCOUNTER — Other Ambulatory Visit: Payer: Self-pay | Admitting: Family Medicine

## 2012-04-17 ENCOUNTER — Other Ambulatory Visit: Payer: Self-pay | Admitting: Family Medicine

## 2012-04-18 ENCOUNTER — Other Ambulatory Visit: Payer: Self-pay | Admitting: Radiology

## 2012-04-18 NOTE — Telephone Encounter (Signed)
Chart already pulled to nurses station with faxed request

## 2012-04-20 ENCOUNTER — Encounter: Payer: Self-pay | Admitting: Cardiology

## 2012-04-25 ENCOUNTER — Other Ambulatory Visit: Payer: Self-pay | Admitting: Family Medicine

## 2012-04-26 ENCOUNTER — Other Ambulatory Visit: Payer: Self-pay | Admitting: Family Medicine

## 2012-04-28 NOTE — Telephone Encounter (Signed)
Patients chart is at the nurses station in the pa pool pile.  [facility] [id number]

## 2012-04-28 NOTE — Telephone Encounter (Signed)
Please pull paper chart.  

## 2012-05-24 ENCOUNTER — Other Ambulatory Visit: Payer: Self-pay | Admitting: Physician Assistant

## 2012-05-25 ENCOUNTER — Other Ambulatory Visit: Payer: Self-pay | Admitting: Physician Assistant

## 2012-05-25 NOTE — Telephone Encounter (Signed)
Chart pulled to PA pool at nurses station [phone]

## 2012-05-26 ENCOUNTER — Other Ambulatory Visit: Payer: Self-pay | Admitting: *Deleted

## 2012-06-02 ENCOUNTER — Other Ambulatory Visit: Payer: Self-pay | Admitting: Physician Assistant

## 2012-06-10 ENCOUNTER — Other Ambulatory Visit: Payer: Self-pay | Admitting: Family Medicine

## 2012-06-21 ENCOUNTER — Other Ambulatory Visit: Payer: Self-pay | Admitting: Physician Assistant

## 2012-06-27 ENCOUNTER — Other Ambulatory Visit: Payer: Self-pay | Admitting: Physician Assistant

## 2012-06-27 NOTE — Telephone Encounter (Signed)
Needs office visit. Final notice.

## 2012-06-30 ENCOUNTER — Other Ambulatory Visit: Payer: Self-pay | Admitting: Family Medicine

## 2012-07-01 NOTE — Telephone Encounter (Signed)
Please pull paper chart.  

## 2012-07-01 NOTE — Telephone Encounter (Signed)
Chart pulled at nurses station pa pool [phone]

## 2012-07-05 NOTE — Telephone Encounter (Signed)
Chart pulled to [place] pool [id number]

## 2012-07-05 NOTE — Telephone Encounter (Signed)
Needs office visit for further refills

## 2012-07-20 ENCOUNTER — Other Ambulatory Visit: Payer: Self-pay | Admitting: Physician Assistant

## 2012-07-20 NOTE — Telephone Encounter (Signed)
Chart pulled to PA pool at nurses station [phone]

## 2012-07-26 ENCOUNTER — Other Ambulatory Visit: Payer: Self-pay | Admitting: Physician Assistant

## 2012-08-05 ENCOUNTER — Ambulatory Visit (INDEPENDENT_AMBULATORY_CARE_PROVIDER_SITE_OTHER): Payer: PRIVATE HEALTH INSURANCE | Admitting: Internal Medicine

## 2012-08-05 VITALS — BP 112/71 | HR 99 | Temp 98.1°F | Resp 16 | Ht 68.0 in | Wt 234.2 lb

## 2012-08-05 DIAGNOSIS — L309 Dermatitis, unspecified: Secondary | ICD-10-CM

## 2012-08-05 DIAGNOSIS — E1165 Type 2 diabetes mellitus with hyperglycemia: Secondary | ICD-10-CM | POA: Insufficient documentation

## 2012-08-05 DIAGNOSIS — F329 Major depressive disorder, single episode, unspecified: Secondary | ICD-10-CM

## 2012-08-05 DIAGNOSIS — R739 Hyperglycemia, unspecified: Secondary | ICD-10-CM

## 2012-08-05 DIAGNOSIS — N289 Disorder of kidney and ureter, unspecified: Secondary | ICD-10-CM

## 2012-08-05 DIAGNOSIS — F331 Major depressive disorder, recurrent, moderate: Secondary | ICD-10-CM | POA: Insufficient documentation

## 2012-08-05 DIAGNOSIS — E1122 Type 2 diabetes mellitus with diabetic chronic kidney disease: Secondary | ICD-10-CM | POA: Insufficient documentation

## 2012-08-05 DIAGNOSIS — E785 Hyperlipidemia, unspecified: Secondary | ICD-10-CM

## 2012-08-05 DIAGNOSIS — F3342 Major depressive disorder, recurrent, in full remission: Secondary | ICD-10-CM | POA: Insufficient documentation

## 2012-08-05 DIAGNOSIS — R7309 Other abnormal glucose: Secondary | ICD-10-CM

## 2012-08-05 DIAGNOSIS — IMO0002 Reserved for concepts with insufficient information to code with codable children: Secondary | ICD-10-CM | POA: Insufficient documentation

## 2012-08-05 DIAGNOSIS — K219 Gastro-esophageal reflux disease without esophagitis: Secondary | ICD-10-CM

## 2012-08-05 DIAGNOSIS — E119 Type 2 diabetes mellitus without complications: Secondary | ICD-10-CM | POA: Insufficient documentation

## 2012-08-05 DIAGNOSIS — G4730 Sleep apnea, unspecified: Secondary | ICD-10-CM | POA: Insufficient documentation

## 2012-08-05 DIAGNOSIS — E669 Obesity, unspecified: Secondary | ICD-10-CM

## 2012-08-05 DIAGNOSIS — N400 Enlarged prostate without lower urinary tract symptoms: Secondary | ICD-10-CM

## 2012-08-05 DIAGNOSIS — I10 Essential (primary) hypertension: Secondary | ICD-10-CM | POA: Insufficient documentation

## 2012-08-05 DIAGNOSIS — F32A Depression, unspecified: Secondary | ICD-10-CM | POA: Insufficient documentation

## 2012-08-05 LAB — POCT CBC
Hemoglobin: 14.2 g/dL (ref 14.1–18.1)
MID (cbc): 0.6 (ref 0–0.9)
MPV: 9.8 fL (ref 0–99.8)
POC Granulocyte: 5.3 (ref 2–6.9)
POC MID %: 7.4 %M (ref 0–12)
Platelet Count, POC: 264 10*3/uL (ref 142–424)
RBC: 4.72 M/uL (ref 4.69–6.13)

## 2012-08-05 LAB — POCT GLYCOSYLATED HEMOGLOBIN (HGB A1C): Hemoglobin A1C: 9.1

## 2012-08-05 MED ORDER — RANITIDINE HCL 300 MG PO TABS: 150.0000 mg | ORAL_TABLET | Freq: Two times a day (BID) | ORAL | Status: DC

## 2012-08-05 MED ORDER — SIMVASTATIN 40 MG PO TABS: 40.0000 mg | ORAL_TABLET | Freq: Every day | ORAL | Status: DC

## 2012-08-05 MED ORDER — LOSARTAN POTASSIUM 100 MG PO TABS: 100.0000 mg | ORAL_TABLET | Freq: Every day | ORAL | Status: DC

## 2012-08-05 MED ORDER — ATENOLOL-CHLORTHALIDONE 100-25 MG PO TABS: 1.0000 | ORAL_TABLET | Freq: Every day | ORAL | Status: DC

## 2012-08-05 MED ORDER — TERAZOSIN HCL 1 MG PO CAPS: 1.0000 mg | ORAL_CAPSULE | Freq: Every day | ORAL | Status: DC

## 2012-08-05 MED ORDER — FLUOCINONIDE-E 0.05 % EX CREA: TOPICAL_CREAM | Freq: Two times a day (BID) | CUTANEOUS | Status: DC

## 2012-08-05 MED ORDER — METFORMIN HCL 1000 MG PO TABS: 1000.0000 mg | ORAL_TABLET | Freq: Two times a day (BID) | ORAL | Status: DC

## 2012-08-05 MED ORDER — OMEPRAZOLE 40 MG PO CPDR: 40.0000 mg | DELAYED_RELEASE_CAPSULE | Freq: Every day | ORAL | Status: DC

## 2012-08-05 MED ORDER — INSULIN NPH (HUMAN) (ISOPHANE) 100 UNIT/ML ~~LOC~~ SUSP: 25.0000 [IU] | Freq: Every day | SUBCUTANEOUS | Status: DC

## 2012-08-05 NOTE — Progress Notes (Signed)
Subjective:    Patient ID: Rodney Farley, male    DOB: 12/31/1931, 79 y.o.   MRN: [id number]  HPI here for followup for 1st time in 2013 despite emergency room visit and summer 2013 hyperglycemia when he was asked to followup with [place] the next day. Patient Active Problem List  Diagnosis  . Electromechanical dissociation (EMD) with heart block  . Ischemic cardiomyopathy  . Obesity-he has gained 5 pounds   . Pacemaker-cardiac problems have been stable since he got his pacemaker 2010  . Depression-Dr. love has evaluated his complaints of memory loss over the past year and feels that depression is a significant feature and may be the total cause. He has recently increased his Zoloft to 50 mg and feels better.   . Sleep apnea-sleep study by Dr. Gwenette Greet last spring was very positive and include restless leg movements   . Diabetes type 2, uncontrolled  . HTN (hypertension)-home blood pressures have been good      Patient is in today to refill medications. Patient just returned from [place] on vacation. Blood sugar has been on the higher side. Pt brought in readings from home admitting they are not taken at a consistent time. He is not sure why his sugar is elevated. He denies any big changes to his diet. He continues with Metformin and NovulinN 20u daily. Eats only 2 meals around 10:30 and 6 PM. He admits that he is not paying attention to achieving great control. Outside blood sugars range from 180-280 in the morning fasting overpassed 3 weeks although was better than that in December.  Review of Systems No complaints of fatigue or night sweats No chest pain or palpitations No dyspnea on exertion No edema No GI complaints No GU complaints No memory loss/no neurological complaints Continues with intermittent foot eczema which does respond to treatment    Objective:   Physical Exam BP 112/71  Pulse 99  Temp 98.1 F (36.7 C) (Oral)  Resp 16  Ht 5\' 8"  (1.727 m)  Wt 234 lb 3.2 oz (106.232  kg)  BMI 35.61 kg/m2  SpO2 96% HEENT clear Heart regular without murmur/no carotid bruits  Lungs clear Extremities with trace edema/good peripheral pulses/patch of eczema on the dorsum of the left foot Neurological intact/sensation intact lower extremities       Assessment & Plan:   1. Hyperglycemia  POCT CBC, POCT glycosylated hemoglobin (Hb A1C), Comprehensive metabolic panel, Lipid panel  2. Depression    3. Sleep apnea    4. Obesity    5. Diabetes type 2, uncontrolled  metFORMIN (GLUCOPHAGE) 1000 MG tablet, simvastatin (ZOCOR) 40 MG tablet, insulin NPH (NOVOLIN N) 100 UNIT/ML injection  6. HTN (hypertension)  losartan (COZAAR) 100 MG tablet, atenolol-chlorthalidone (TENORETIC) 100-25 MG per tablet  7. Eczema  fluocinonide-emollient (LIDEX-E) 0.05 % cream  8. GERD (gastroesophageal reflux disease)  ranitidine (ZANTAC) 300 MG tablet, omeprazole (PRILOSEC) 40 MG capsule  9. BPH (benign prostatic hyperplasia)  terazosin (HYTRIN) 1 MG capsule  10. Hyperlipidemia  simvastatin (ZOCOR) 40 MG tablet  11. Mild renal insufficiency Results for orders placed in visit on 08/05/12  POCT CBC      Component Value Range   WBC 8.7  4.6 - 10.2 K/uL   Lymph, poc 2.8  0.6 - 3.4   POC LYMPH PERCENT 31.7  10 - 50 %L   MID (cbc) 0.6  0 - 0.9   POC MID % 7.4  0 - 12 %M   POC Granulocyte 5.3  2 -  6.9   Granulocyte percent 60.9  37 - 80 %G   RBC 4.72  4.69 - 6.13 M/uL   Hemoglobin 14.2  14.1 - 18.1 g/dL   HCT, POC 44.4  43.5 - 53.7 %   MCV 94.0  80 - 97 fL   MCH, POC 30.1  27 - 31.2 pg   MCHC 32.0  31.8 - 35.4 g/dL   RDW, POC 13.6     Platelet Count, POC 264  142 - 424 K/uL   MPV 9.8  0 - 99.8 fL  POCT GLYCOSYLATED HEMOGLOBIN (HGB A1C)      Component Value Range   Hemoglobin A1C 9.1    COMPREHENSIVE METABOLIC PANEL      Component Value Range   Sodium 137  135 - 145 mEq/L   Potassium 5.1  3.5 - 5.3 mEq/L   Chloride 104  96 - 112 mEq/L   CO2 25  19 - 32 mEq/L   Glucose, Bld 241 (*) 70 - 99  mg/dL   BUN 48 (*) 6 - 23 mg/dL   Creat 1.75 (*) 0.50 - 1.35 mg/dL   Total Bilirubin 0.4  0.3 - 1.2 mg/dL   Alkaline Phosphatase 62  39 - 117 U/L   AST 17  0 - 37 U/L   ALT 23  0 - 53 U/L   Total Protein 7.0  6.0 - 8.3 g/dL   Albumin 4.4  3.5 - 5.2 g/dL   Calcium 9.6  8.4 - 10.5 mg/dL  LIPID PANEL      Component Value Range   Cholesterol 220 (*) 0 - 200 mg/dL   Triglycerides 430 (*) <150 mg/dL   HDL 37 (*) >39 mg/dL   Total CHOL/HDL Ratio 5.9     VLDL NOT CALC  0 - 40 mg/dL   LDL Cholesterol       Meds ordered this encounter  Medications  . losartan (COZAAR) 100 MG tablet    Sig: Take 1 tablet (100 mg total) by mouth daily.    Dispense:  90 tablet    Refill:  3  . terazosin (HYTRIN) 1 MG capsule    Sig: Take 1 capsule (1 mg total) by mouth at bedtime.    Dispense:  90 capsule    Refill:  3  . atenolol-chlorthalidone (TENORETIC) 100-25 MG per tablet    Sig: Take 1 tablet by mouth daily.    Dispense:  90 tablet    Refill:  3    Needs ov  . metFORMIN (GLUCOPHAGE) 1000 MG tablet    Sig: Take 1 tablet (1,000 mg total) by mouth 2 (two) times daily with a meal.    Dispense:  180 tablet    Refill:  0    Needs office visit. Final notice.  . ranitidine (ZANTAC) 300 MG tablet    Sig: Take 0.5 tablets (150 mg total) by mouth 2 (two) times daily.    Dispense:  180 tablet    Refill:  3    Needs ov  . simvastatin (ZOCOR) 40 MG tablet    Sig: Take 1 tablet (40 mg total) by mouth at bedtime.    Dispense:  90 tablet    Refill:  3  . insulin NPH (NOVOLIN N) 100 UNIT/ML injection    Sig: Inject 25 Units into the skin daily before breakfast.    Dispense:  10 mL    Refill:  11    Needs office visit. Final notice.  [name] omeprazole (PRILOSEC) 40 MG  capsule    Sig: Take 1 capsule (40 mg total) by mouth daily.    Dispense:  90 capsule    Refill:  3  . fluocinonide-emollient (LIDEX-E) 0.05 % cream    Sig: Apply topically 2 (two) times daily. To lesion on foot    Dispense:  30 g     Refill:  2   It is time to for him to try seriously to control his diabetes/lose weight/improve his lipid profile

## 2012-08-06 LAB — COMPREHENSIVE METABOLIC PANEL
ALT: 23 U/L (ref 0–53)
AST: 17 U/L (ref 0–37)
Albumin: 4.4 g/dL (ref 3.5–5.2)
Alkaline Phosphatase: 62 U/L (ref 39–117)
BUN: 48 mg/dL — ABNORMAL HIGH (ref 6–23)
Calcium: 9.6 mg/dL (ref 8.4–10.5)
Chloride: 104 mEq/L (ref 96–112)
Creat: 1.75 mg/dL — ABNORMAL HIGH (ref 0.50–1.35)
Potassium: 5.1 mEq/L (ref 3.5–5.3)

## 2012-08-06 LAB — LIPID PANEL
HDL: 37 mg/dL — ABNORMAL LOW (ref >39)
Total CHOL/HDL Ratio: 5.9 Ratio

## 2012-08-07 ENCOUNTER — Encounter: Payer: Self-pay | Admitting: Internal Medicine

## 2012-08-07 DIAGNOSIS — K219 Gastro-esophageal reflux disease without esophagitis: Secondary | ICD-10-CM | POA: Insufficient documentation

## 2012-08-07 DIAGNOSIS — N289 Disorder of kidney and ureter, unspecified: Secondary | ICD-10-CM | POA: Insufficient documentation

## 2012-08-21 ENCOUNTER — Other Ambulatory Visit: Payer: Self-pay | Admitting: Physician Assistant

## 2012-09-01 ENCOUNTER — Other Ambulatory Visit: Payer: Self-pay | Admitting: Internal Medicine

## 2012-09-12 ENCOUNTER — Other Ambulatory Visit: Payer: Self-pay | Admitting: Internal Medicine

## 2012-09-14 ENCOUNTER — Encounter: Payer: Self-pay | Admitting: Cardiology

## 2012-09-14 ENCOUNTER — Telehealth: Payer: Self-pay | Admitting: Internal Medicine

## 2012-09-14 NOTE — Telephone Encounter (Signed)
Message copied by [name] on Wed Sep 14, 2012 10:49 AM ------      Message from: [name]      Created: Tue Sep 13, 2012 10:24 PM       call ------

## 2012-09-26 ENCOUNTER — Telehealth: Payer: Self-pay

## 2012-09-26 NOTE — Telephone Encounter (Signed)
Request from pharmacy for Lantus Rx to replace higher priced Novolin. LMOM for pt to call to clarify what is wanted.

## 2012-09-28 ENCOUNTER — Encounter: Payer: Self-pay | Admitting: *Deleted

## 2012-09-29 ENCOUNTER — Other Ambulatory Visit: Payer: Self-pay | Admitting: Internal Medicine

## 2012-09-29 NOTE — Telephone Encounter (Signed)
Lantus cheaper than novolin, please advise on dosage and send to [facility].

## 2012-09-30 MED ORDER — INSULIN GLARGINE 100 UNIT/ML ~~LOC~~ SOLN: 20.0000 [IU] | Freq: Every day | SUBCUTANEOUS | Status: DC

## 2012-09-30 NOTE — Telephone Encounter (Signed)
Since we are changing from NPH to Lantus we can change convert unit-to-unit and continue to give once daily. Please have patient pay close attention to his blood sugars over the next several days. Medication sent in. Have patient stop the NPH.

## 2012-09-30 NOTE — Telephone Encounter (Signed)
Patients number disconnected have advised pharmacy.

## 2012-10-03 ENCOUNTER — Other Ambulatory Visit: Payer: Self-pay

## 2012-10-03 MED ORDER — INSULIN PEN NEEDLE 31G X 5 MM MISC: Status: DC

## 2012-11-26 ENCOUNTER — Other Ambulatory Visit: Payer: Self-pay | Admitting: Physician Assistant

## 2012-12-02 ENCOUNTER — Other Ambulatory Visit: Payer: Self-pay | Admitting: Physician Assistant

## 2013-02-01 ENCOUNTER — Encounter: Payer: Self-pay | Admitting: *Deleted

## 2013-02-08 ENCOUNTER — Other Ambulatory Visit: Payer: Self-pay | Admitting: Internal Medicine

## 2013-04-17 ENCOUNTER — Other Ambulatory Visit: Payer: Self-pay | Admitting: Physician Assistant

## 2013-04-18 ENCOUNTER — Ambulatory Visit (INDEPENDENT_AMBULATORY_CARE_PROVIDER_SITE_OTHER): Payer: No Typology Code available for payment source | Admitting: Internal Medicine

## 2013-04-18 ENCOUNTER — Encounter: Payer: Self-pay | Admitting: Internal Medicine

## 2013-04-18 VITALS — BP 138/82 | HR 84 | Temp 98.3°F | Resp 20 | Ht 67.0 in | Wt 227.4 lb

## 2013-04-18 DIAGNOSIS — E1140 Type 2 diabetes mellitus with diabetic neuropathy, unspecified: Secondary | ICD-10-CM

## 2013-04-18 DIAGNOSIS — E1165 Type 2 diabetes mellitus with hyperglycemia: Secondary | ICD-10-CM

## 2013-04-18 DIAGNOSIS — F329 Major depressive disorder, single episode, unspecified: Secondary | ICD-10-CM

## 2013-04-18 DIAGNOSIS — E1149 Type 2 diabetes mellitus with other diabetic neurological complication: Secondary | ICD-10-CM

## 2013-04-18 MED ORDER — METFORMIN HCL 1000 MG PO TABS: 1000.0000 mg | ORAL_TABLET | Freq: Two times a day (BID) | ORAL | Status: DC

## 2013-04-18 MED ORDER — SERTRALINE HCL 100 MG PO TABS: 100.0000 mg | ORAL_TABLET | Freq: Every day | ORAL | Status: DC

## 2013-04-18 NOTE — Progress Notes (Addendum)
Subjective:    Patient ID: Rodney Farley, male    DOB: March 07, 1932, 79 y.o.   MRN: [id number]  HPIsent over by optometrist today because bs record= all over 300 and eyes too variable to adjust Pt currently has uncontrolled diabetes. After appointment with Optometrists, they referred pt to go to physician to figure out the causing factor to his swollen eye ball. Pt could not find test strips to measure his blood sugar until last 2 weeks $$$.. Pt is currently using a Lantus pen with 20 units. Pt takes 20 units twice a day and currently has 1 & 1/2 pen left. Pt cannot afford to continue with Lantus due to its price. Yesterday pt ate chicken noodle soup and a plum and sugar was very high, wonders why cause no other food.  Pt is also complaining of a subjective numbing sensation at the bottom of both of his toes.6 months. No lesions.  Pt's prescription for sertraline is currently expired(DR Love). Pt states his physician increased his dosage from 50 mg to 100 mg and believes it is working well for him. $+stress-[name] lost her job. Savings eroding rapidly.   Past Medical History  Diagnosis Date  . COPD (chronic obstructive pulmonary disease)     Severe  . Hypercholesterolemia   . Depression   . Renal insufficiency   . Coronary artery disease   . Hypertension   . Atrioventricular block, complete   . Pacemaker     medtronic  . Obesity   . Sleep apnea   . Diabetes mellitus    No Known Allergies  Current Outpatient Prescriptions on File Prior to Visit  Medication Sig Dispense Refill  . aspirin 81 MG tablet Take 81 mg by mouth daily.      [name] atenolol-chlorthalidone (TENORETIC) 100-25 MG per tablet Take 1 tablet by mouth daily.  90 tablet  3  . fluocinonide-emollient (LIDEX-E) 0.05 % cream Apply topically 2 (two) times daily. To lesion on foot  30 g  2  . Glucagon, rDNA, (GLUCAGON EMERGENCY IJ) Inject 1 Syringe as directed once.      . insulin glargine (LANTUS SOLOSTAR) 100 UNIT/ML injection  Inject 0.2 mLs (20 Units total) into the skin at bedtime.  5 pen  11  . Insulin Pen Needle (B-D UF III MINI PEN NEEDLES) 31G X 5 MM MISC Use to test blood sugar 3 times daily. Dx code: 250.02  100 each  11  . losartan (COZAAR) 100 MG tablet TAKE 1 TABLET DAILY  30 tablet  3  . losartan (COZAAR) 100 MG tablet Take 1 tablet (100 mg total) by mouth daily.  90 tablet  3  . metFORMIN (GLUCOPHAGE) 1000 MG tablet TAKE 1 TABLET (1,000 MG TOTAL) BY MOUTH 2 (TWO) TIMES DAILY WITH A MEAL.---- NEEDS OFFICE VISIT  60 tablet  0  . metFORMIN (GLUCOPHAGE) 1000 MG tablet Take 1 tablet (1,000 mg total) by mouth 2 (two) times daily with a meal. PATIENT NEEDS OFFICE VISIT FOR ADDITIONAL REFILLS - 2nd NOTICE  30 tablet  0  . omeprazole (PRILOSEC) 40 MG capsule Take 1 capsule (40 mg total) by mouth daily.  90 capsule  3  . ranitidine (ZANTAC) 300 MG tablet Take 0.5 tablets (150 mg total) by mouth 2 (two) times daily.  180 tablet  3  . simvastatin (ZOCOR) 40 MG tablet Take 1 tablet (40 mg total) by mouth at bedtime.  90 tablet  3  . terazosin (HYTRIN) 1 MG capsule Take 1 capsule (1 mg  total) by mouth at bedtime.  90 capsule  3  . loratadine (CLARITIN) 10 MG tablet Take 10 mg by mouth daily.      [name] neomycin-bacitracin-polymyxin (NEOSPORIN) [phone] ointment Apply 1 application topically 2 (two) times daily as needed. For irritation               Review of Systems  Constitutional: Negative for activity change, appetite change, fatigue and unexpected weight change.  Eyes: Positive for visual disturbance.  Musculoskeletal: Positive for gait problem (Sometimes unsteady). Negative for joint swelling.  Neurological: Positive for numbness (at the bottom of feet).  Psychiatric/Behavioral:       Very stressed by finances, wife lost job.    BP 138/82  Pulse 84  Temp(Src) 98.3 F (36.8 C) (Oral)  Resp 20  Ht 5\' 7"  (1.702 m)  Wt 227 lb 6.4 oz (103.148 kg)  BMI 35.61 kg/m2  SpO2 96%  Objective:   Physical Exam   Constitutional: He is oriented to person, place, and time. He appears well-developed. No distress.  Eyes: Conjunctivae and EOM are normal. Pupils are equal, round, and reactive to light.  Cardiovascular: Normal rate and regular rhythm.   Pulmonary/Chest: Effort normal.  Musculoskeletal: Normal range of motion. He exhibits no edema.  Normal ROM of ankle and feet.  Neurological: He is alert and oriented to person, place, and time. He displays normal reflexes. No cranial nerve deficit.  lost of sensation to light touch on the bottom of both feet.  Skin: Skin is warm and dry. No rash noted.  Psychiatric: He has a normal mood and affect. His behavior is normal. Judgment and thought content normal.      Assessment & Plan:  Diabetes type 2, uncontrolled  Depression  Diabetic neuropathy, type II diabetes mellitus---new onset    Meds ordered this encounter  Medications  . sertraline (ZOLOFT) 100 MG tablet    Sig: Take 1 tablet (100 mg total) by mouth daily.    Dispense:  90 tablet    Refill:  3  to start to control glucose more closely and to try to lose weight. If cannot continue lantus, he will call. May call for other meds as needed Discussed indigent cloinic but he's not ready

## 2013-04-25 ENCOUNTER — Encounter: Payer: Self-pay | Admitting: Internal Medicine

## 2013-04-25 ENCOUNTER — Telehealth: Payer: Self-pay | Admitting: Internal Medicine

## 2013-04-25 NOTE — Telephone Encounter (Signed)
04-25-13 sent pt past due letter last check device was 02-2012, klein was 10-2011, needs klein or brooke/mt

## 2013-05-12 ENCOUNTER — Other Ambulatory Visit: Payer: Self-pay | Admitting: Internal Medicine

## 2013-05-12 NOTE — Progress Notes (Signed)
lantus too expensive Will try novolin 70/30  1 vial 28$ at [facility]  He will need novolin bid Start with 25 units before bkfast and 25 units before supper Check BS before giving insulin and don't give any if below 130 Continue to record BS levels and send [place] results again in 7-10 days at this dose  He'll need syringes and needle?? Disp novolin 70/30 vial=73ml---he'll need 2 vials with 11 refills called in where he chooses

## 2013-05-15 ENCOUNTER — Telehealth: Payer: Self-pay | Admitting: Radiology

## 2013-05-15 MED ORDER — INSULIN NPH ISOPHANE & REGULAR (70-30) 100 UNIT/ML ~~LOC~~ SUSP: 25.0000 [IU] | Freq: Two times a day (BID) | SUBCUTANEOUS | Status: DC

## 2013-05-15 MED ORDER — NEEDLES & SYRINGES MISC: Status: DC

## 2013-05-15 NOTE — Progress Notes (Signed)
lantus too expensive Will try novolin 70/30  1 vial 28$ at [facility]  He will need novolin bid Start with 25 units before bkfast and 25 units before supper Check BS before giving insulin and don't give any if below 130 Continue to record BS levels and send [place] results again in 7-10 days at this dose  He'll need syringes and needle?? Disp novolin 70/30 vial=21ml---he'll need 2 vials with 11 refills called in where he chooses

## 2013-05-15 NOTE — Telephone Encounter (Signed)
Advised him of this, he will drop off the blood glucose readings for me next week

## 2013-05-15 NOTE — Telephone Encounter (Signed)
lantus too expensive Will try novolin 70/30  1 vial 28$ at [facility]  He will need novolin bid Start with 25 units before bkfast and 25 units before supper Check BS before giving insulin and don't give any if below 130 Continue to record BS levels and send [place] results again in 7-10 days at this dose  He'll need syringes and needle?? Disp novolin 70/30 vial=49ml---he'll need 2 vials with 11 refills called in where he chooses

## 2013-05-15 NOTE — Telephone Encounter (Signed)
Sent in the Novolin, left message for him to call me back so I can advise.

## 2013-06-05 ENCOUNTER — Other Ambulatory Visit: Payer: Self-pay | Admitting: Internal Medicine

## 2013-06-06 NOTE — Telephone Encounter (Signed)
Dr. [name],  Please advise.

## 2013-08-16 ENCOUNTER — Telehealth: Payer: Self-pay

## 2013-08-16 DIAGNOSIS — I10 Essential (primary) hypertension: Secondary | ICD-10-CM

## 2013-08-16 MED ORDER — ATENOLOL-CHLORTHALIDONE 100-25 MG PO TABS: 1.0000 | ORAL_TABLET | Freq: Every day | ORAL | Status: DC

## 2013-08-16 MED ORDER — LOSARTAN POTASSIUM 100 MG PO TABS
ORAL_TABLET | ORAL | Status: DC
Start: 2013-08-16 — End: 2013-08-28

## 2013-08-16 NOTE — Telephone Encounter (Signed)
Pharm sent req for new Rxs for atenolol/chlorathal, and losartin. Pt is due for f/up on HTN and on DM. Will give 1 mos RF.

## 2013-08-19 ENCOUNTER — Other Ambulatory Visit: Payer: Self-pay | Admitting: Internal Medicine

## 2013-08-25 ENCOUNTER — Other Ambulatory Visit: Payer: Self-pay | Admitting: Internal Medicine

## 2013-08-27 ENCOUNTER — Ambulatory Visit (INDEPENDENT_AMBULATORY_CARE_PROVIDER_SITE_OTHER): Payer: 59 | Admitting: Internal Medicine

## 2013-08-27 VITALS — BP 154/82 | HR 81 | Temp 98.1°F | Resp 18 | Ht 67.0 in | Wt 234.0 lb

## 2013-08-27 DIAGNOSIS — K219 Gastro-esophageal reflux disease without esophagitis: Secondary | ICD-10-CM

## 2013-08-27 DIAGNOSIS — IMO0001 Reserved for inherently not codable concepts without codable children: Secondary | ICD-10-CM

## 2013-08-27 DIAGNOSIS — E1165 Type 2 diabetes mellitus with hyperglycemia: Secondary | ICD-10-CM

## 2013-08-27 DIAGNOSIS — N400 Enlarged prostate without lower urinary tract symptoms: Secondary | ICD-10-CM

## 2013-08-27 DIAGNOSIS — I255 Ischemic cardiomyopathy: Secondary | ICD-10-CM

## 2013-08-27 DIAGNOSIS — I10 Essential (primary) hypertension: Secondary | ICD-10-CM

## 2013-08-27 DIAGNOSIS — F32A Depression, unspecified: Secondary | ICD-10-CM

## 2013-08-27 DIAGNOSIS — Z23 Encounter for immunization: Secondary | ICD-10-CM

## 2013-08-27 DIAGNOSIS — F329 Major depressive disorder, single episode, unspecified: Secondary | ICD-10-CM

## 2013-08-27 DIAGNOSIS — IMO0002 Reserved for concepts with insufficient information to code with codable children: Secondary | ICD-10-CM

## 2013-08-27 DIAGNOSIS — E669 Obesity, unspecified: Secondary | ICD-10-CM

## 2013-08-27 DIAGNOSIS — E785 Hyperlipidemia, unspecified: Secondary | ICD-10-CM

## 2013-08-27 DIAGNOSIS — N289 Disorder of kidney and ureter, unspecified: Secondary | ICD-10-CM

## 2013-08-27 LAB — LIPID PANEL
CHOLESTEROL: 240 mg/dL — AB (ref 0–200)
HDL: 38 mg/dL — AB (ref >39)
Total CHOL/HDL Ratio: 6.3 Ratio
Triglycerides: 549 mg/dL — ABNORMAL HIGH (ref <150)

## 2013-08-27 LAB — COMPREHENSIVE METABOLIC PANEL
ALBUMIN: 4 g/dL (ref 3.5–5.2)
ALK PHOS: 56 U/L (ref 39–117)
ALT: 25 U/L (ref 0–53)
AST: 17 U/L (ref 0–37)
BUN: 40 mg/dL — AB (ref 6–23)
CALCIUM: 9.3 mg/dL (ref 8.4–10.5)
CHLORIDE: 102 meq/L (ref 96–112)
CO2: 22 meq/L (ref 19–32)
Creat: 1.58 mg/dL — ABNORMAL HIGH (ref 0.50–1.35)
GLUCOSE: 355 mg/dL — AB (ref 70–99)
POTASSIUM: 5 meq/L (ref 3.5–5.3)
Sodium: 134 mEq/L — ABNORMAL LOW (ref 135–145)
Total Bilirubin: 0.3 mg/dL (ref 0.2–1.2)
Total Protein: 6.4 g/dL (ref 6.0–8.3)

## 2013-08-27 LAB — POCT CBC
GRANULOCYTE PERCENT: 64.3 % (ref 37–80)
HEMATOCRIT: 44.9 % (ref 43.5–53.7)
Hemoglobin: 14.1 g/dL (ref 14.1–18.1)
LYMPH, POC: 2.5 (ref 0.6–3.4)
MCH, POC: 29.8 pg (ref 27–31.2)
MCHC: 31.4 g/dL — AB (ref 31.8–35.4)
MCV: 95 fL (ref 80–97)
MID (cbc): 0.6 (ref 0–0.9)
MPV: 9.7 fL (ref 0–99.8)
POC Granulocyte: 5.6 (ref 2–6.9)
POC LYMPH %: 28.8 % (ref 10–50)
POC MID %: 6.9 %M (ref 0–12)
Platelet Count, POC: 223 10*3/uL (ref 142–424)
RBC: 4.73 M/uL (ref 4.69–6.13)
RDW, POC: 14.9 %
WBC: 8.7 10*3/uL (ref 4.6–10.2)

## 2013-08-27 LAB — AMYLASE: Amylase: 48 U/L (ref 0–105)

## 2013-08-27 LAB — POCT GLYCOSYLATED HEMOGLOBIN (HGB A1C): HEMOGLOBIN A1C: 7

## 2013-08-27 NOTE — Progress Notes (Signed)
Subjective:    Patient ID: Rodney Farley, male    DOB: 1932/02/19, 80 y.o.   MRN: [id number]  HPI Chief Complaint  Patient presents with  . Diabetes    follow up   This chart was scribed for Rodney Lin, MD by Thea Alken, ED Scribe. This patient was seen in room 3 and the patient's care was started at 9:33 AM.  HPI Comments: Rodney Farley is a 80 y.o. male who presents to the [facility] for a follow up for DM and medication. Pt blood levels are in good condition. Pt is requesting to change his medication back to 55 mg of simvastatin.  Pt is concerned about the condition of his pancreas and asking if it is necessary to keep taking metformin.   Pt reports that he has less sensation on the bottom of his feet and had tingling at times.  He brings an extensive record of his blood sugars and is showing better control than at any time in recent history   Pt has not received a flu shot this year.    Patient Active Problem List   Diagnosis Date Noted  . GERD (gastroesophageal reflux disease) 08/07/2012  . Renal insufficiency 08/07/2012  . Depression---- He states that Sertraline medication has been fine  08/05/2012  . Sleep apnea 08/05/2012  . Diabetes type 2, uncontrolled 08/05/2012  . HTN (hypertension) 08/05/2012  . Ischemic cardiomyopathy 11/03/2011  . Electromechanical dissociation (EMD) with heart block----remains asymptomatic    . Obesity   . Pacemaker    He is excited that his wife has been employed by [employer] and he is male able to have insurance No Known Allergies Prior to Admission medications   Medication Sig Start Date End Date Taking? Authorizing Provider  aspirin 81 MG tablet Take 81 mg by mouth daily.   Yes Historical Provider, MD  atenolol-chlorthalidone (TENORETIC) 100-25 MG per tablet Take 1 tablet by mouth daily. PATIENT NEEDS OFFICE VISIT FOR ADDITIONAL REFILLS 08/16/13  Yes [name], MD  fluocinonide-emollient (LIDEX-E) 0.05 %  cream Apply topically 2 (two) times daily. To lesion on foot 08/05/12  Yes [name], MD  Glucagon, rDNA, (GLUCAGON EMERGENCY IJ) Inject 1 Syringe as directed once.   Yes Historical Provider, MD  Insulin Pen Needle (B-D UF III MINI PEN NEEDLES) 31G X 5 MM MISC Use to test blood sugar 3 times daily. Dx code: 250.02 10/03/12  Yes [name], MD  loratadine (CLARITIN) 10 MG tablet Take 10 mg by mouth daily.   Yes Historical Provider, MD  losartan (COZAAR) 100 MG tablet Take 1 tablet (100 mg total) by mouth daily. 08/05/12  Yes [name], MD  losartan (COZAAR) 100 MG tablet TAKE 1 TABLET DAILY. PATIENT NEEDS OFFICE VISIT FOR ADDITIONAL REFILLS 08/16/13  Yes [name], MD  metFORMIN (GLUCOPHAGE) 1000 MG tablet Take 1 tablet (1,000 mg total) by mouth 2 (two) times daily with a meal. 04/18/13  Yes [name], MD  Needles & Syringes MISC Insulin syringes and needles for injection of insulin bid 05/15/13  Yes [name], MD  neomycin-bacitracin-polymyxin (NEOSPORIN) [phone] ointment Apply 1 application topically 2 (two) times daily as needed. For irritation   Yes Historical Provider, MD  NOVOLIN 70/30 (70-30) 100 UNIT/ML injection INJECT 25 UNITS INTO THE SKIN 2 (TWO) TIMES DAILY WITH A MEAL. 06/05/13  Yes [name], MD  omeprazole (PRILOSEC) 40 MG capsule Take 1 capsule (40 mg total) by  mouth daily. 08/05/12  Yes [name], MD  ranitidine (ZANTAC) 300 MG tablet Take 0.5 tablets (150 mg total) by mouth 2 (two) times daily. 08/05/12  Yes [name], MD  sertraline (ZOLOFT) 100 MG tablet Take 1 tablet (100 mg total) by mouth daily. 04/18/13  Yes [name], MD  simvastatin (ZOCOR) 40 MG tablet Take 1 tablet (40 mg total) by mouth at bedtime. 08/05/12  Yes [name], MD  terazosin (HYTRIN) 1 MG capsule Take 1 capsule (1 mg total) by mouth at bedtime. 08/05/12  Yes [name], MD     Review of Systems No fever chills  or night sweats Mild weight loss intended No vision changes-has been able to see ophthalmologist and received new glasses Reflexes not active while he is on medication No chest pain or palpitations No shortness of breath although he is sedentary and has poor physical capacity No diarrhea or constipation     Objective:   Physical Exam  Nursing note and vitals reviewed. Constitutional: He is oriented to person, place, and time. He appears well-developed and well-nourished. No distress.  HENT:  Head: Normocephalic and atraumatic.  Mouth/Throat: Oropharynx is clear and moist.  Eyes: Conjunctivae and EOM are normal. Pupils are equal, round, and reactive to light.  Neck: Neck supple. No thyromegaly present.  Cardiovascular: Normal rate, regular rhythm, normal heart sounds and intact distal pulses.   No murmur heard. Pulmonary/Chest: Effort normal and breath sounds normal. No respiratory distress.  Musculoskeletal: Normal range of motion. He exhibits no edema and no tenderness.  Lymphadenopathy:    He has no cervical adenopathy.  Neurological: He is alert and oriented to person, place, and time.  Monofilament testing reveals loss under the big toe with sensation remaining over the heel and lateral plantar surface of the foot  Skin: Skin is warm and dry.  Psychiatric: He has a normal mood and affect. His behavior is normal.   Filed Vitals:   08/27/13 1006  BP: 154/82  Pulse: 81  Temp: 98.1 F (36.7 C)  Resp: 18    Results for orders placed in visit on 08/27/13  COMPREHENSIVE METABOLIC PANEL      Result Value Ref Range   Sodium 134 (*) 135 - 145 mEq/L   Potassium 5.0  3.5 - 5.3 mEq/L   Chloride 102  96 - 112 mEq/L   CO2 22  19 - 32 mEq/L   Glucose, Bld 355 (*) 70 - 99 mg/dL   BUN 40 (*) 6 - 23 mg/dL   Creat 1.58 (*) 0.50 - 1.35 mg/dL   Total Bilirubin 0.3  0.2 - 1.2 mg/dL   Alkaline Phosphatase 56  39 - 117 U/L   AST 17  0 - 37 U/L   ALT 25  0 - 53 U/L   Total Protein 6.4   6.0 - 8.3 g/dL   Albumin 4.0  3.5 - 5.2 g/dL   Calcium 9.3  8.4 - 10.5 mg/dL  LIPID PANEL      Result Value Ref Range   Cholesterol 240 (*) 0 - 200 mg/dL   Triglycerides 549 (*) <150 mg/dL   HDL 38 (*) >39 mg/dL   Total CHOL/HDL Ratio 6.3     VLDL NOT CALC  0 - 40 mg/dL   LDL Cholesterol NOT CALC  0 - 99 mg/dL  AMYLASE      Result Value Ref Range   Amylase 48  0 - 105 U/L  POCT CBC  Result Value Ref Range   WBC 8.7  4.6 - 10.2 K/uL   Lymph, poc 2.5  0.6 - 3.4   POC LYMPH PERCENT 28.8  10 - 50 %L   MID (cbc) 0.6  0 - 0.9   POC MID % 6.9  0 - 12 %M   POC Granulocyte 5.6  2 - 6.9   Granulocyte percent 64.3  37 - 80 %G   RBC 4.73  4.69 - 6.13 M/uL   Hemoglobin 14.1  14.1 - 18.1 g/dL   HCT, POC 44.9  43.5 - 53.7 %   MCV 95.0  80 - 97 fL   MCH, POC 29.8  27 - 31.2 pg   MCHC 31.4 (*) 31.8 - 35.4 g/dL   RDW, POC 14.9     Platelet Count, POC 223  142 - 424 K/uL   MPV 9.7  0 - 99.8 fL  POCT GLYCOSYLATED HEMOGLOBIN (HGB A1C)      Result Value Ref Range   Hemoglobin A1C 7.0     based on his home blood sugars this value seems unlikely    Assessment & Plan:  I have completed the patient encounter in its entirety as documented by the scribe, with editing by me where necessary. Robert P. [name], M.D. Diabetes type 2, uncontrolled--- with early neuropathy  Improving/he wishes to change to Lantus now that he is insured/we'll continue metformin for now  Add b12 Other and unspecified hyperlipidemia -will need medication restart with lipitor 40(was on zocor 40)  Unspecified essential hypertension -continue home blood pressures to see if additional meds will be needed  Flu vaccine need - Plan: Flu Vaccine QUAD 36+ mos IM  Depression--continue Zoloft  GERD (gastroesophageal reflux disease)-continue omeprazole  Ischemic cardiomyopathy with third degree block and pacemaker-to follow up with cardiology  Obesity--continue efforts at weight loss  Renal insufficiency--we will  decrease metformin as Lantus improves control  Sleep issues-? The need to perform sleep studies  Meds ordered this encounter  Medications  . losartan (COZAAR) 100 MG tablet    Sig: Take 1 tablet (100 mg total) by mouth daily.    Dispense:  90 tablet    Refill:  3  . terazosin (HYTRIN) 1 MG capsule    Sig: Take 1 capsule (1 mg total) by mouth at bedtime.    Dispense:  90 capsule    Refill:  3  . atenolol-chlorthalidone (TENORETIC) 100-25 MG per tablet    Sig: Take 1 tablet by mouth daily.    Dispense:  90 tablet    Refill:  3  . metFORMIN (GLUCOPHAGE) 1000 MG tablet    Sig: Take 1 tablet (1,000 mg total) by mouth 2 (two) times daily with a meal.    Dispense:  180 tablet    Refill:  3    May dispense as 3 month supply if desired  . omeprazole (PRILOSEC) 40 MG capsule    Sig: TAKE 1 CAPSULE (40 MG TOTAL) BY MOUTH DAILY.    Dispense:  90 capsule    Refill:  3  . sertraline (ZOLOFT) 100 MG tablet    Sig: Take 1 tablet (100 mg total) by mouth daily.    Dispense:  90 tablet    Refill:  3  . Insulin Glargine (LANTUS SOLOSTAR) 100 UNIT/ML Solostar Pen    Sig: Inject 55 Units into the skin daily.    Dispense:  5 pen    Refill:  PRN  . atorvastatin (LIPITOR) 40 MG tablet  Sig: Take 1 tablet (40 mg total) by mouth daily.    Dispense:  90 tablet    Refill:  3   We will advance Lantus as indicated depending on his glucose checks

## 2013-08-28 MED ORDER — OMEPRAZOLE 40 MG PO CPDR: DELAYED_RELEASE_CAPSULE | ORAL | Status: DC

## 2013-08-28 MED ORDER — METFORMIN HCL 1000 MG PO TABS: 1000.0000 mg | ORAL_TABLET | Freq: Two times a day (BID) | ORAL | Status: DC

## 2013-08-28 MED ORDER — LOSARTAN POTASSIUM 100 MG PO TABS: 100.0000 mg | ORAL_TABLET | Freq: Every day | ORAL | Status: DC

## 2013-08-28 MED ORDER — ATORVASTATIN CALCIUM 40 MG PO TABS: 40.0000 mg | ORAL_TABLET | Freq: Every day | ORAL | Status: DC

## 2013-08-28 MED ORDER — INSULIN GLARGINE 100 UNIT/ML SOLOSTAR PEN: 55.0000 [IU] | PEN_INJECTOR | Freq: Every day | SUBCUTANEOUS | Status: DC

## 2013-08-28 MED ORDER — SERTRALINE HCL 100 MG PO TABS
100.0000 mg | ORAL_TABLET | Freq: Every day | ORAL | Status: DC
Start: 2013-08-28 — End: 2014-12-11

## 2013-08-28 MED ORDER — ATENOLOL-CHLORTHALIDONE 100-25 MG PO TABS: 1.0000 | ORAL_TABLET | Freq: Every day | ORAL | Status: DC

## 2013-08-28 MED ORDER — TERAZOSIN HCL 1 MG PO CAPS: 1.0000 mg | ORAL_CAPSULE | Freq: Every day | ORAL | Status: DC

## 2013-08-30 ENCOUNTER — Telehealth: Payer: Self-pay | Admitting: Internal Medicine

## 2013-08-30 ENCOUNTER — Encounter: Payer: Self-pay | Admitting: Internal Medicine

## 2013-08-30 ENCOUNTER — Encounter: Payer: Self-pay | Admitting: Cardiology

## 2013-08-30 ENCOUNTER — Ambulatory Visit (INDEPENDENT_AMBULATORY_CARE_PROVIDER_SITE_OTHER): Payer: 59 | Admitting: Cardiology

## 2013-08-30 VITALS — BP 142/79 | HR 93 | Ht 67.0 in | Wt 230.1 lb

## 2013-08-30 DIAGNOSIS — I2510 Atherosclerotic heart disease of native coronary artery without angina pectoris: Secondary | ICD-10-CM

## 2013-08-30 DIAGNOSIS — I442 Atrioventricular block, complete: Secondary | ICD-10-CM

## 2013-08-30 DIAGNOSIS — Z950 Presence of cardiac pacemaker: Secondary | ICD-10-CM

## 2013-08-30 NOTE — Patient Instructions (Signed)
Your physician wants you to follow-up in: 6 months with Dr. Klein. You will receive a reminder letter in the mail two months in advance. If you don't receive a letter, please call our office to schedule the follow-up appointment.  Your physician recommends that you continue on your current medications as directed. Please refer to the Current Medication list given to you today.  

## 2013-08-30 NOTE — Telephone Encounter (Signed)
Called and spoke w/ patients wife informed her to have the patient call back and schedule a 3 mo fu in may  w/[place]

## 2013-08-31 NOTE — Progress Notes (Signed)
ELECTROPHYSIOLOGY OFFICE NOTE   Patient ID: Rodney Farley MRN: [id number], DOB/AGE: 07/22/1931   Date of Visit: 08/30/2013  Primary Physician: [name], MD Primary Cardiologist: [name], MD Primary EP: Caryl Comes, MD Reason for Visit: EP/device follow-up  History of Present Illness  Rodney Farley is a 80 y.o. male with complete heart block s/p PPM implant in 2010, CAD, ischemic CM, EF 35-40% by echo in 2011, EF 49% by adenosine Myovew in 2010, OSA, COPD and DM who presents today for routine electrophysiology followup. He was last seen by Dr. Caryl Comes in April 2013.   Since last being seen in our clinic, he reports he is doing well and has no complaints. He has chronic DOE but states this is not worsening from baseline. He denies chest pain. He denies palpitations, dizziness, near syncope or syncope. He denies LE swelling, orthopnea, PND or recent weight gain. He is compliant and tolerating medications without difficulty.  Past Medical History Past Medical History  Diagnosis Date  . COPD (chronic obstructive pulmonary disease)     Severe  . Hypercholesterolemia   . Depression   . Renal insufficiency   . Coronary artery disease   . Hypertension   . Atrioventricular block, complete   . Pacemaker     medtronic  . Obesity   . Sleep apnea   . Diabetes mellitus     Past Surgical History Past Surgical History  Procedure Laterality Date  . Pacemaker insertion      Complete heart block status post DDD with good function  . Tonsillectomy    . Insert / replace / remove pacemaker    . Bladder removal      Allergies/Intolerances No Known Allergies  Current Home Medications Current Outpatient Prescriptions  Medication Sig Dispense Refill  . aspirin 81 MG tablet Take 81 mg by mouth daily.      [name] atenolol-chlorthalidone (TENORETIC) 100-25 MG per tablet Take 1 tablet by mouth daily.  90 tablet  3  . insulin NPH-regular Human (NOVOLIN 70/30) (70-30) 100 UNIT/ML injection Inject 25 Units  into the skin 2 (two) times daily with a meal.      . Insulin Pen Needle (B-D UF III MINI PEN NEEDLES) 31G X 5 MM MISC Use to test blood sugar 3 times daily. Dx code: 250.02  100 each  11  . losartan (COZAAR) 100 MG tablet Take 1 tablet (100 mg total) by mouth daily.  90 tablet  3  . metFORMIN (GLUCOPHAGE) 1000 MG tablet Take 1 tablet (1,000 mg total) by mouth 2 (two) times daily with a meal.  180 tablet  3  . Needles & Syringes MISC Insulin syringes and needles for injection of insulin bid  100 each  2  . omeprazole (PRILOSEC) 40 MG capsule TAKE 1 CAPSULE (40 MG TOTAL) BY MOUTH DAILY.  90 capsule  3  . ranitidine (ZANTAC) 300 MG tablet Take 300 mg by mouth at bedtime.      . sertraline (ZOLOFT) 100 MG tablet Take 1 tablet (100 mg total) by mouth daily.  90 tablet  3  . simvastatin (ZOCOR) 40 MG tablet Take 40 mg by mouth daily.      [name] terazosin (HYTRIN) 1 MG capsule Take 1 capsule (1 mg total) by mouth at bedtime.  90 capsule  3   No current facility-administered medications for this visit.    Social History History   Social History  . Marital Status: Married    Spouse Name: N/A    Number of  Children: N/A  . Years of Education: N/A   Occupational History  . Not on file.   Social History Main Topics  . Smoking status: Former Research scientist (life sciences)  . Smokeless tobacco: Not on file  . Alcohol Use: Yes     Comment: Occa.  . Drug Use: No  . Sexual Activity: Not on file   Other Topics Concern  . Not on file   Social History Narrative  . No narrative on file     Review of Systems General: No chills, fever, night sweats or weight changes Cardiovascular: No chest pain, dyspnea on exertion, edema, orthopnea, palpitations, paroxysmal nocturnal dyspnea Dermatological: No rash, lesions or masses Respiratory: No cough, dyspnea Urologic: No hematuria, dysuria Abdominal: No nausea, vomiting, diarrhea, bright red blood per rectum, melena, or hematemesis Neurologic: No visual changes, weakness,  changes in mental status All other systems reviewed and are otherwise negative except as noted above.  Physical Exam Vitals: Blood pressure 142/79, pulse 93, height 5\' 7"  (1.702 m), weight 230 lb 1.9 oz (104.382 kg).  General: Well developed, well appearing 80 y.o. male in no acute distress. HEENT: Normocephalic, atraumatic. EOMs intact. Sclera nonicteric. Oropharynx clear.  Neck: Supple. No JVD. Lungs: Respirations regular and unlabored, CTA bilaterally. No wheezes, rales or rhonchi. Heart: RRR. S1, S2 present. No murmurs, rub, S3 or S4. Abdomen: Soft, non-distended.  Extremities: No clubbing, cyanosis or edema. PT/Radials 2+ and equal bilaterally. Psych: Normal affect. Neuro: Alert and oriented X 3. Moves all extremities spontaneously. Skin: PPM implant site intact and well healed.   Diagnostics Device interrogation today - Normal device function. Thresholds, sensing, impedances consistent with previous measurements. Device programmed to maximize longevity. 2 mode switch episodes, both less than 1 minute in duration, no EGMs. No high ventricular rates noted. Device programmed at appropriate safety margins. Histogram distribution appropriate for patient activity level. Device programmed to optimize intrinsic conduction. Estimated longevity 8.5 years.   Assessment and Plan  1. Complete heart block s/p PPM implant Normal device function  No programming changes made Continue routine remote PPM follow-up every 3 months Return for follow-up with Dr. Caryl Comes in one year  2. CAD No anginal symptoms Continue medical therapy and routine follow-up with Dr. [name]  Signed, Marnell Mcdaniel, PA-C 08/31/2013, 11:20 AM

## 2013-09-05 ENCOUNTER — Encounter: Payer: Self-pay | Admitting: Internal Medicine

## 2013-09-06 LAB — MDC_IDC_ENUM_SESS_TYPE_INCLINIC
Battery Impedance: 277 Ohm
Brady Statistic AP VP Percent: 27 %
Brady Statistic AP VS Percent: 70 %
Brady Statistic AS VS Percent: 2 %
Date Time Interrogation Session: 20150218214500
Lead Channel Impedance Value: 575 Ohm
Lead Channel Pacing Threshold Amplitude: 1 V
Lead Channel Pacing Threshold Pulse Width: 0.4 ms
Lead Channel Pacing Threshold Pulse Width: 0.4 ms
Lead Channel Setting Pacing Amplitude: 2.5 V
Lead Channel Setting Pacing Pulse Width: 0.4 ms
Lead Channel Setting Sensing Sensitivity: 2.8 mV
MDC IDC MSMT BATTERY REMAINING LONGEVITY: 104 mo
MDC IDC MSMT BATTERY VOLTAGE: 2.79 V
MDC IDC MSMT LEADCHNL RA IMPEDANCE VALUE: 458 Ohm
MDC IDC MSMT LEADCHNL RA SENSING INTR AMPL: 4 mV
MDC IDC MSMT LEADCHNL RV PACING THRESHOLD AMPLITUDE: 0.75 V
MDC IDC SET LEADCHNL RA PACING AMPLITUDE: 2 V
MDC IDC STAT BRADY AS VP PERCENT: 1 %

## 2013-09-27 ENCOUNTER — Other Ambulatory Visit: Payer: Self-pay | Admitting: Internal Medicine

## 2014-01-06 ENCOUNTER — Other Ambulatory Visit: Payer: Self-pay | Admitting: Internal Medicine

## 2014-01-06 DIAGNOSIS — E1165 Type 2 diabetes mellitus with hyperglycemia: Secondary | ICD-10-CM

## 2014-01-06 DIAGNOSIS — N289 Disorder of kidney and ureter, unspecified: Secondary | ICD-10-CM

## 2014-01-06 DIAGNOSIS — IMO0002 Reserved for concepts with insufficient information to code with codable children: Secondary | ICD-10-CM

## 2014-01-06 DIAGNOSIS — I10 Essential (primary) hypertension: Secondary | ICD-10-CM

## 2014-01-06 NOTE — Progress Notes (Signed)
He provided home monitoring Blood pressure ranged max of 165/95 and a minimum of 100/65 - are equally distributed with 50%  above the recommended range Blood sugars am and pm are not yet acceptable range in the 110-250 in the morning and 120-250 in the evening Assuming that he is on 55 units of Lantus as prescribed February at his last visit, he should increase this by 5 units nightly Please call to verify so I can rewrite his prescription  We will wait to increase his blood pressure medication-he should continue to work on this by weight loss

## 2014-01-07 NOTE — Progress Notes (Signed)
Left message on machine to call back  

## 2014-01-08 ENCOUNTER — Telehealth: Payer: Self-pay | Admitting: Family Medicine

## 2014-01-08 NOTE — Telephone Encounter (Signed)
LMOM for pt to cb °

## 2014-01-09 MED ORDER — INSULIN GLARGINE 100 UNIT/ML SOLOSTAR PEN: 60.0000 [IU] | PEN_INJECTOR | Freq: Every day | SUBCUTANEOUS | Status: DC

## 2014-01-09 NOTE — Addendum Note (Signed)
Addended by: [name] on: 01/09/2014 10:39 AM   Modules accepted: Orders

## 2014-01-09 NOTE — Progress Notes (Signed)
Spoke to Raylene (pt wife on [employer])  Advised to increase Lantus to 60 units nightly. She states he will start 60 units.  Please send in new script for this increase.

## 2014-01-09 NOTE — Progress Notes (Signed)
Meds ordered this encounter  Medications  . Insulin Glargine (LANTUS SOLOSTAR) 100 UNIT/ML Solostar Pen    Sig: Inject 60 Units into the skin daily at 10 pm.    Dispense:  5 pen    Refill:  PRN   Send sugars in 2-3 weeks

## 2014-03-06 ENCOUNTER — Ambulatory Visit (INDEPENDENT_AMBULATORY_CARE_PROVIDER_SITE_OTHER): Payer: 59 | Admitting: Internal Medicine

## 2014-03-06 DIAGNOSIS — Z950 Presence of cardiac pacemaker: Secondary | ICD-10-CM

## 2014-03-06 DIAGNOSIS — I442 Atrioventricular block, complete: Secondary | ICD-10-CM

## 2014-03-07 ENCOUNTER — Encounter: Payer: Self-pay | Admitting: Internal Medicine

## 2014-03-13 NOTE — Progress Notes (Signed)
No sow

## 2014-04-26 ENCOUNTER — Other Ambulatory Visit: Payer: Self-pay | Admitting: Internal Medicine

## 2014-04-30 ENCOUNTER — Encounter: Payer: Self-pay | Admitting: Internal Medicine

## 2014-06-16 ENCOUNTER — Encounter: Payer: 59 | Attending: Internal Medicine

## 2014-06-16 VITALS — Ht 67.0 in | Wt 233.3 lb

## 2014-06-16 DIAGNOSIS — E1165 Type 2 diabetes mellitus with hyperglycemia: Secondary | ICD-10-CM | POA: Insufficient documentation

## 2014-06-16 DIAGNOSIS — Z794 Long term (current) use of insulin: Secondary | ICD-10-CM | POA: Diagnosis not present

## 2014-06-16 DIAGNOSIS — Z713 Dietary counseling and surveillance: Secondary | ICD-10-CM | POA: Diagnosis not present

## 2014-06-16 DIAGNOSIS — IMO0002 Reserved for concepts with insufficient information to code with codable children: Secondary | ICD-10-CM

## 2014-06-18 NOTE — Progress Notes (Signed)
Patient was seen on 06/16/14 for the complete diabetes self-management series at the [facility]. This is a part of the [facility] [employer].  Handouts given during class include:  Living Well with Diabetes book  Carb Counting and Meal Planning book  Meal Plan Card  Carbohydrate guide  Meal planning worksheet  Low Sodium Flavoring Tips  The diabetes portion plate  Low Carbohydrate Snack Suggestions  A1c to eAG Conversion Chart  Diabetes Medications  Stress Management  Diabetes Recommended Care Schedule  Diabetes Success Plan  Core Class Satisfaction Survey  The following learning objectives were met by the patient during this course:  Describe diabetes  State some common risk factors for diabetes  Defines the role of glucose and insulin  Identifies type of diabetes and pathophysiology  Describe the relationship between diabetes and cardiovascular risk  State the members of the Healthcare Team  States the rationale for glucose monitoring  State when to test glucose  State their individual Target Range  State the importance of logging glucose readings  Describe how to interpret glucose readings  Identifies A1C target  Explain the correlation between A1c and eAG values  State symptoms and treatment of high blood glucose  State symptoms and treatment of low blood glucose  Explain proper technique for glucose testing  Identifies proper sharps disposal  Describe the role of different macronutrients on glucose  Explain how carbohydrates affect blood glucose  State what foods contain the most carbohydrates  Demonstrate carbohydrate counting  Demonstrate how to read Nutrition Facts food label  Describe effects of various fats on heart health  Describe the importance of good nutrition for health and healthy eating strategies  Describe techniques for managing your shopping, cooking and meal planning  List  strategies to follow meal plan when dining out  Describe the effects of alcohol on glucose and how to use it safely . State the amount of activity recommended for healthy living . Describe activities suitable for individual needs . Identify ways to regularly incorporate activity into daily life . Identify barriers to activity and ways to over come these barriers  Identify diabetes medications being personally used and their primary action for lowering glucose and possible side effects . Describe role of stress on blood glucose and develop strategies to address psychosocial issues . Identify diabetes complications and ways to prevent them  Explain how to manage diabetes during illness . Evaluate success in meeting personal goal . Establish 2-3 goals that they will plan to diligently work on until they return for the  57-monthfollow-up visit  Goals:   I will try to count my carb choices at most meals and snacks  I will be active 60 minutes or more 2 times a week  I will take my diabetes medications as scheduled  I will test my glucose at least 4 times a day, 7 days a week  I will look at patterns in my record book at least 4 days a month  To help manage stress I will walk at least 2 times a week  Your patient has identified these potential barriers to change:  Motivation Finances  Your patient has identified their diabetes self-care support plan as  Family Support   Plan: Follow up with [facility]

## 2014-07-31 ENCOUNTER — Ambulatory Visit (INDEPENDENT_AMBULATORY_CARE_PROVIDER_SITE_OTHER): Payer: 59 | Admitting: *Deleted

## 2014-07-31 DIAGNOSIS — Z950 Presence of cardiac pacemaker: Secondary | ICD-10-CM | POA: Diagnosis not present

## 2014-07-31 DIAGNOSIS — I442 Atrioventricular block, complete: Secondary | ICD-10-CM

## 2014-07-31 LAB — MDC_IDC_ENUM_SESS_TYPE_INCLINIC
Battery Impedance: 422 Ohm
Battery Remaining Longevity: 82 mo
Battery Voltage: 2.79 V
Brady Statistic AP VP Percent: 93 %
Brady Statistic AS VP Percent: 7 %
Date Time Interrogation Session: 20160119172652
Lead Channel Impedance Value: 593 Ohm
Lead Channel Pacing Threshold Amplitude: 0.75 V
Lead Channel Pacing Threshold Amplitude: 1 V
Lead Channel Setting Pacing Amplitude: 2 V
Lead Channel Setting Pacing Amplitude: 2.5 V
Lead Channel Setting Pacing Pulse Width: 0.4 ms
Lead Channel Setting Sensing Sensitivity: 2.8 mV
MDC IDC MSMT LEADCHNL RA IMPEDANCE VALUE: 450 Ohm
MDC IDC MSMT LEADCHNL RA PACING THRESHOLD PULSEWIDTH: 0.4 ms
MDC IDC MSMT LEADCHNL RA SENSING INTR AMPL: 2.8 mV
MDC IDC MSMT LEADCHNL RV PACING THRESHOLD PULSEWIDTH: 0.4 ms
MDC IDC STAT BRADY AP VS PERCENT: 0 %
MDC IDC STAT BRADY AS VS PERCENT: 0 %

## 2014-07-31 NOTE — Patient Instructions (Signed)
You are overdue to see Dr. [name].    Please return to see Dr. Caryl Comes in three months.

## 2014-07-31 NOTE — Progress Notes (Signed)
Pacemaker check in clinic. Normal device function. Thresholds, sensing, impedances consistent with previous measurements. Device programmed to maximize longevity. 1 mode switch--- <0.1%. No high ventricular rates noted. Device programmed at appropriate safety margins. Histogram distribution shows secondary peak---changed activity threshold from med/low to med/high. Device programmed to optimize intrinsic conduction. Estimated longevity 73yrs. ROV w/ Dr. Caryl Comes 11/06/14.

## 2014-08-13 ENCOUNTER — Encounter: Payer: Self-pay | Admitting: Internal Medicine

## 2014-09-11 ENCOUNTER — Telehealth: Payer: Self-pay

## 2014-09-11 MED ORDER — GLUCOSE BLOOD VI STRP: ORAL_STRIP | Status: DC

## 2014-09-11 NOTE — Telephone Encounter (Signed)
PATIENT'S HUSBAND (RAYLENE) CALLED TO LET DR. [name] KNOW THAT HER HUSBAND NEEDS TO HAVE GENERIC TRUE RESULTS BLOOD TEST STRIPS CALLED INTO THEIR PHARMACY. HE GOT 10 FREE SAMPLES IN HIS BOX AND HE HAS AN APPT. TO SEE DR. DOOLITTLE ON WED., BUT HE WILL RUN OUT BEFORE THEN. BEST PHONE [phone] (RAYLENE [name])  PHARMACY CHOICE IS  OUTPATIENT PHARMACY.  [place]

## 2014-09-11 NOTE — Telephone Encounter (Signed)
Rx sent/Pts wife notified 

## 2014-09-13 ENCOUNTER — Ambulatory Visit (INDEPENDENT_AMBULATORY_CARE_PROVIDER_SITE_OTHER): Payer: 59 | Admitting: Cardiology

## 2014-09-13 ENCOUNTER — Encounter: Payer: Self-pay | Admitting: Cardiology

## 2014-09-13 VITALS — BP 160/78 | HR 72 | Ht 67.0 in | Wt 230.9 lb

## 2014-09-13 DIAGNOSIS — I25708 Atherosclerosis of coronary artery bypass graft(s), unspecified, with other forms of angina pectoris: Secondary | ICD-10-CM | POA: Insufficient documentation

## 2014-09-13 DIAGNOSIS — N289 Disorder of kidney and ureter, unspecified: Secondary | ICD-10-CM

## 2014-09-13 DIAGNOSIS — E1165 Type 2 diabetes mellitus with hyperglycemia: Secondary | ICD-10-CM

## 2014-09-13 DIAGNOSIS — G4730 Sleep apnea, unspecified: Secondary | ICD-10-CM

## 2014-09-13 DIAGNOSIS — I25119 Atherosclerotic heart disease of native coronary artery with unspecified angina pectoris: Secondary | ICD-10-CM

## 2014-09-13 DIAGNOSIS — R0609 Other forms of dyspnea: Secondary | ICD-10-CM

## 2014-09-13 DIAGNOSIS — I255 Ischemic cardiomyopathy: Secondary | ICD-10-CM

## 2014-09-13 DIAGNOSIS — I2510 Atherosclerotic heart disease of native coronary artery without angina pectoris: Secondary | ICD-10-CM | POA: Insufficient documentation

## 2014-09-13 DIAGNOSIS — IMO0002 Reserved for concepts with insufficient information to code with codable children: Secondary | ICD-10-CM

## 2014-09-13 DIAGNOSIS — I10 Essential (primary) hypertension: Secondary | ICD-10-CM

## 2014-09-13 DIAGNOSIS — Z950 Presence of cardiac pacemaker: Secondary | ICD-10-CM

## 2014-09-13 NOTE — Patient Instructions (Signed)
We will schedule you for an Echocardiogram and pulmonary function testing. I will see you following these studies.

## 2014-09-13 NOTE — Progress Notes (Signed)
[name] Date of Birth: 17-Apr-1932 Medical Record [id number]  History of Present Illness: Mr. Haxton is seen to reestablish cardiac care. He is a pleasant 81 yo WM with extensive cardiac history. He is s/p MI x 2 in 1994 and 1995 while in [place]. He may have had angioplasty at that time but really cannot recall. He had an Adenosine Myoview in 2010 that showed an anteroseptal and apical scar without ischemia. EF 49%. Echo at that time showed septal and apical akinesis with EF 40-45%. He had complete heart block in 2010 and had a pacemaker placed at that time. He was lost to follow up subsequently.  On presentation today he complains of sever SOB with any activity. It is worse in the cold weather. He has to stop several times walking up his driveway. He feels his chest pounding and has some chest discomfort but no true pain. These symptoms have progressed over the past year and are very limiting for him. No cough, PND, orthopnea, or edema. Sometimes running a humidifier with menthol helps. No wheezing. He does have a history of poorly controlled DM, CKD stage 3, HTN, and hyperlipidemia. He has a remote history of tobacco use and has a history of COPD and moderate sleep apnea.     Medication List       This list is accurate as of: 09/13/14  5:24 PM.  Always use your most recent med list.               aspirin 81 MG tablet  Take 81 mg by mouth daily.     atenolol-chlorthalidone 100-25 MG per tablet  Commonly known as:  TENORETIC  Take 1 tablet by mouth daily.     atorvastatin 40 MG tablet  Commonly known as:  LIPITOR  Take 40 mg by mouth daily.     Insulin Glargine 100 UNIT/ML Solostar Pen  Commonly known as:  LANTUS SOLOSTAR  Inject 60 Units into the skin daily at 10 pm.     Insulin Pen Needle 31G X 5 MM Misc  Commonly known as:  B-D UF III MINI PEN NEEDLES  Use to test blood sugar 3 times daily. Dx code: 250.02     losartan 100 MG tablet  Commonly known as:  COZAAR    Take 1 tablet (100 mg total) by mouth daily.     metFORMIN 1000 MG tablet  Commonly known as:  GLUCOPHAGE  Take 1 tablet (1,000 mg total) by mouth 2 (two) times daily with a meal.     Needles & Syringes Misc  Insulin syringes and needles for injection of insulin bid     omeprazole 40 MG capsule  Commonly known as:  PRILOSEC  TAKE 1 CAPSULE (40 MG TOTAL) BY MOUTH DAILY.     ranitidine 300 MG tablet  Commonly known as:  ZANTAC  TAKE 0.5 TABLETS (150 MG TOTAL) BY MOUTH 2 (TWO) TIMES DAILY.     sertraline 100 MG tablet  Commonly known as:  ZOLOFT  Take 1 tablet (100 mg total) by mouth daily.     vitamin B-12 1000 MCG tablet  Commonly known as:  CYANOCOBALAMIN  Take 1,000 mcg by mouth daily.        No Known Allergies  Past Medical History  Diagnosis Date  . COPD (chronic obstructive pulmonary disease)     Severe  . Hypercholesterolemia   . Depression   . Renal insufficiency   . Coronary artery disease   . Hypertension   .  Atrioventricular block, complete   . Pacemaker     medtronic  . Obesity   . Sleep apnea   . Diabetes mellitus   . Old MI (myocardial infarction)   . Dyspnea on exertion 09/13/2014    Past Surgical History  Procedure Laterality Date  . Pacemaker insertion      Complete heart block status post DDD with good function  . Tonsillectomy    . Insert / replace / remove pacemaker    . Bladder removal    . Cholecystectomy      History   Social History  . Marital Status: Married    Spouse Name: N/A  . Number of Children: 1  . Years of Education: N/A   Social History Main Topics  . Smoking status: Former Research scientist (life sciences)  . Smokeless tobacco: Not on file  . Alcohol Use: Yes     Comment: Occa.  . Drug Use: No  . Sexual Activity: Not on file   Other Topics Concern  . None   Social History Narrative    History reviewed. No pertinent family history.  Review of Systems: The review of systems is positive for difficulty with urination at times.  sometimes with straining his legs go weak. He does have peripheral neuropathy related to DM.  All other systems were reviewed and are negative.  Physical Exam: BP 160/78 mmHg  Pulse 72  Ht 5\' 7"  (1.702 m)  Wt 230 lb 14.4 oz (104.736 kg)  BMI 36.16 kg/m2 Filed Weights   09/13/14 1431  Weight: 230 lb 14.4 oz (104.736 kg)  GENERAL:  Well appearing, obese WM in NAD.  HEENT:  PERRL, EOMI, sclera are clear. Oropharynx is clear. NECK:  No jugular venous distention, carotid upstroke brisk and symmetric, no bruits, no thyromegaly or adenopathy LUNGS:  Clear to auscultation bilaterally CHEST:  Unremarkable HEART:  RRR,  PMI not displaced or sustained,S1 and S2 within normal limits, no S3, no S4: no clicks, no rubs, no murmurs ABD:  Soft, nontender. BS +, no masses or bruits. No hepatomegaly, no splenomegaly. Old midline surgical incision.  EXT:  2 + pulses throughout, no edema, no cyanosis no clubbing SKIN:  Warm and dry.  No rashes NEURO:  Alert and oriented x 3. Cranial nerves II through XII intact. PSYCH:  Cognitively intact    LABORATORY DATA: Ecg today shows AV sequential pacing. Rate 72. I have personally reviewed and interpreted this study..  Assessment / Plan: 1. Severe dyspnea on exertion. This may be related to COPD or coronary ischemia. No clinical evidence of CHF by EF was mildly reduced in the past. He does have a history of moderate sleep apnea. Will arrange for an Echo and PFTs. I am not sure that stress testing will be very revealing at this point. I have requested his old records to review. If above studies are unrevealing we may consider a right and left heart cath with coronary angiography. He is interested in more aggressive evaluation if we can potentially find something that is treatable to make his quality of life better.   2. Complete heart block. S/p DDD pacer.  3. CAD with remote MI x 2.   4. Ischemic cardiomyopathy with history of mild LV dysfunction  5. DM with  peripheral neuropathy.  6. CKD stage 3  7. HTN   8. Hyperlpidemia.

## 2014-09-19 ENCOUNTER — Encounter: Payer: Self-pay | Admitting: Internal Medicine

## 2014-09-19 ENCOUNTER — Other Ambulatory Visit: Payer: Self-pay | Admitting: Internal Medicine

## 2014-09-19 ENCOUNTER — Encounter: Payer: Self-pay | Admitting: Cardiology

## 2014-09-19 ENCOUNTER — Ambulatory Visit (INDEPENDENT_AMBULATORY_CARE_PROVIDER_SITE_OTHER): Payer: 59 | Admitting: Internal Medicine

## 2014-09-19 VITALS — BP 186/83 | HR 68 | Temp 97.9°F | Resp 16 | Ht 67.5 in | Wt 230.0 lb

## 2014-09-19 DIAGNOSIS — R350 Frequency of micturition: Secondary | ICD-10-CM

## 2014-09-19 DIAGNOSIS — N400 Enlarged prostate without lower urinary tract symptoms: Secondary | ICD-10-CM | POA: Diagnosis not present

## 2014-09-19 DIAGNOSIS — R39198 Other difficulties with micturition: Secondary | ICD-10-CM

## 2014-09-19 DIAGNOSIS — F329 Major depressive disorder, single episode, unspecified: Secondary | ICD-10-CM

## 2014-09-19 DIAGNOSIS — E669 Obesity, unspecified: Secondary | ICD-10-CM

## 2014-09-19 DIAGNOSIS — N289 Disorder of kidney and ureter, unspecified: Secondary | ICD-10-CM

## 2014-09-19 DIAGNOSIS — E1165 Type 2 diabetes mellitus with hyperglycemia: Secondary | ICD-10-CM

## 2014-09-19 DIAGNOSIS — R3919 Other difficulties with micturition: Secondary | ICD-10-CM

## 2014-09-19 DIAGNOSIS — Z1211 Encounter for screening for malignant neoplasm of colon: Secondary | ICD-10-CM

## 2014-09-19 DIAGNOSIS — Z23 Encounter for immunization: Secondary | ICD-10-CM | POA: Diagnosis not present

## 2014-09-19 DIAGNOSIS — I10 Essential (primary) hypertension: Secondary | ICD-10-CM

## 2014-09-19 DIAGNOSIS — R0609 Other forms of dyspnea: Secondary | ICD-10-CM | POA: Diagnosis not present

## 2014-09-19 DIAGNOSIS — G4730 Sleep apnea, unspecified: Secondary | ICD-10-CM

## 2014-09-19 DIAGNOSIS — I255 Ischemic cardiomyopathy: Secondary | ICD-10-CM | POA: Diagnosis not present

## 2014-09-19 DIAGNOSIS — IMO0002 Reserved for concepts with insufficient information to code with codable children: Secondary | ICD-10-CM

## 2014-09-19 DIAGNOSIS — M25562 Pain in left knee: Secondary | ICD-10-CM

## 2014-09-19 DIAGNOSIS — F32A Depression, unspecified: Secondary | ICD-10-CM

## 2014-09-19 LAB — CBC WITH DIFFERENTIAL/PLATELET
Basophils Absolute: 0 10*3/uL (ref 0.0–0.1)
Basophils Relative: 0 % (ref 0–1)
Eosinophils Absolute: 0.2 10*3/uL (ref 0.0–0.7)
Eosinophils Relative: 3 % (ref 0–5)
HCT: 40.8 % (ref 39.0–52.0)
HEMOGLOBIN: 14 g/dL (ref 13.0–17.0)
Lymphocytes Relative: 28 % (ref 12–46)
Lymphs Abs: 2.2 10*3/uL (ref 0.7–4.0)
MCH: 30.5 pg (ref 26.0–34.0)
MCHC: 34.3 g/dL (ref 30.0–36.0)
MCV: 88.9 fL (ref 78.0–100.0)
MPV: 10.5 fL (ref 8.6–12.4)
Monocytes Absolute: 0.6 10*3/uL (ref 0.1–1.0)
Monocytes Relative: 8 % (ref 3–12)
Neutro Abs: 4.8 10*3/uL (ref 1.7–7.7)
Neutrophils Relative %: 61 % (ref 43–77)
Platelets: 241 10*3/uL (ref 150–400)
RBC: 4.59 MIL/uL (ref 4.22–5.81)
RDW: 14.4 % (ref 11.5–15.5)
WBC: 7.8 10*3/uL (ref 4.0–10.5)

## 2014-09-19 LAB — COMPREHENSIVE METABOLIC PANEL
ALBUMIN: 4 g/dL (ref 3.5–5.2)
ALT: 21 U/L (ref 0–53)
AST: 16 U/L (ref 0–37)
Alkaline Phosphatase: 53 U/L (ref 39–117)
BILIRUBIN TOTAL: 0.5 mg/dL (ref 0.2–1.2)
BUN: 48 mg/dL — AB (ref 6–23)
CO2: 21 mEq/L (ref 19–32)
Calcium: 9.6 mg/dL (ref 8.4–10.5)
Chloride: 104 mEq/L (ref 96–112)
Creat: 1.77 mg/dL — ABNORMAL HIGH (ref 0.50–1.35)
Glucose, Bld: 129 mg/dL — ABNORMAL HIGH (ref 70–99)
Potassium: 4.7 mEq/L (ref 3.5–5.3)
Sodium: 138 mEq/L (ref 135–145)
Total Protein: 6.6 g/dL (ref 6.0–8.3)

## 2014-09-19 LAB — LIPID PANEL
CHOL/HDL RATIO: 5.2 ratio
CHOLESTEROL: 215 mg/dL — AB (ref 0–200)
HDL: 41 mg/dL (ref >=40)
LDL Cholesterol: 125 mg/dL — ABNORMAL HIGH (ref 0–99)
Triglycerides: 247 mg/dL — ABNORMAL HIGH (ref <150)
VLDL: 49 mg/dL — AB (ref 0–40)

## 2014-09-19 LAB — POCT UA - MICROSCOPIC ONLY
Bacteria, U Microscopic: NEGATIVE
Casts, Ur, LPF, POC: NEGATIVE
Crystals, Ur, HPF, POC: NEGATIVE
Epithelial cells, urine per micros: NEGATIVE
MUCUS UA: NEGATIVE
RBC, urine, microscopic: NEGATIVE
WBC, Ur, HPF, POC: NEGATIVE
YEAST UA: NEGATIVE

## 2014-09-19 LAB — POCT URINALYSIS DIPSTICK
GLUCOSE UA: NEGATIVE
KETONES UA: NEGATIVE
LEUKOCYTES UA: NEGATIVE
Nitrite, UA: NEGATIVE
Protein, UA: NEGATIVE
Spec Grav, UA: 1.02
Urobilinogen, UA: 0.2
pH, UA: 6.5

## 2014-09-19 LAB — POCT GLYCOSYLATED HEMOGLOBIN (HGB A1C): Hemoglobin A1C: 7.2

## 2014-09-19 LAB — TSH: TSH: 1.202 u[IU]/mL (ref 0.350–4.500)

## 2014-09-19 MED ORDER — TAMSULOSIN HCL 0.4 MG PO CAPS: 0.4000 mg | ORAL_CAPSULE | Freq: Every day | ORAL | Status: DC

## 2014-09-19 MED ORDER — ZOSTER VACCINE LIVE 19400 UNT/0.65ML ~~LOC~~ SOLR: 0.6500 mL | Freq: Once | SUBCUTANEOUS | Status: DC

## 2014-09-19 NOTE — Patient Instructions (Signed)
Pneumococcal Conjugate Vaccine: What You Need to Know Your doctor recommends that you, or your child, get a dose of PCV13 today. 1. Why get vaccinated? Pneumococcal conjugate vaccine (called PCV13 or Prevnar 13) is recommended to protect infants and toddlers, and some older children and adults with certain health conditions, from pneumococcal disease. Pneumococcal disease is caused by infection with Streptococcus pneumoniae bacteria. These bacteria can spread from person to person through close contact. Pneumococcal disease can lead to severe health problems, including pneumonia, blood infections, and meningitis. Meningitis is an infection of the covering of the brain. Pneumococcal meningitis is fairly rare (less than 1 case per 100,000 people each year), but it leads to other health problems, including deafness and brain damage. In children, it is fatal in about 1 case out of 10. Children younger than two are at higher risk for serious disease than older children. People with certain medical conditions, people over age 66, and cigarette smokers are also at higher risk. Before vaccine, pneumococcal infections caused many problems each year in the [place] in children younger than 5, including:  more than 700 cases of meningitis,  13,000 blood infections,  about 5 million ear infections, and  about 200 deaths. About 4,000 adults still die each year because of pneumococcal infections. Pneumococcal infections can be hard to treat because some strains are resistant to antibiotics. This makes prevention through vaccination even more important. 2. PCV13 vaccine There are more than 90 types of pneumococcal bacteria. PCV13 protects against 13 of them. These 13 strains cause most severe infections in children and about half of infections in adults.  PCV13 is routinely given to children at 2, 4, 6, and 2-73 months of age. Children in this age range are at greatest risk for serious diseases caused  by pneumococcal infection. PCV13 vaccine may also be recommended for some older children or adults. Your doctor can give you details. A second type of pneumococcal vaccine, called PPSV23, may also be given to some children and adults, including anyone over age 9. There is a separate Vaccine Information Statement for this vaccine. 3. Precautions  Anyone who has ever had a life-threatening allergic reaction to a dose of this vaccine, to an earlier pneumococcal vaccine called PCV7 (or Prevnar), or to any vaccine containing diphtheria toxoid (for example, DTaP), should not get PCV13. Anyone with a severe allergy to any component of PCV13 should not get the vaccine. Tell your doctor if the person being vaccinated has any severe allergies. If the person scheduled for vaccination is sick, your doctor might decide to reschedule the shot on another day. Your doctor can give you more information about any of these precautions. 4. What are the risks of PCV13 vaccine?  With any medicine, including vaccines, there is a chance of side effects. These are usually mild and go away on their own, but serious reactions are also possible. Reported problems associated with PCV13 vary by dose and age, but generally:  About half of children became drowsy after the shot, had a temporary loss of appetite, or had redness or tenderness where the shot was given.  About 1 out of 3 had swelling where the shot was given.  About 1 out of 3 had a mild fever, and about 1 in 20 had a higher fever (over 102.18F).  Up to about 8 out of 10 became fussy or irritable. Adults receiving the vaccine have reported redness, pain, and swelling where the shot was given. Mild fever, fatigue, headache, chills, or  muscle pain have also been reported. Life-threatening allergic reactions from any vaccine are very rare. 5. What if there is a serious reaction? What should I look for?  Look for anything that concerns you, such as signs of a  severe allergic reaction, very high fever, or behavior changes. Signs of a severe allergic reaction can include hives, swelling of the face and throat, difficulty breathing, a fast heartbeat, dizziness, and weakness. These would start a few minutes to a few hours after the vaccination. What should I do?  If you think it is a severe allergic reaction or other emergency that can't wait, call 9-1-1 or get the person to the nearest hospital. Otherwise, call your doctor.  Afterward, the reaction should be reported to the Vaccine Adverse Event Reporting System (VAERS). Your doctor might file this report, or you can do it yourself through the VAERS web site at www.vaers.SamedayNews.es, or by calling 716-044-1562. VAERS is only for reporting reactions. They do not give medical advice. 6. The National Vaccine Injury Compensation Program The [employer] Vaccine Injury Compensation Program (VICP) is a federal program that was created to compensate people who may have been injured by certain vaccines. Persons who believe they may have been injured by a vaccine can learn about the program and about filing a claim by calling (212)498-2018 or visiting the [place] website at GoldCloset.com.ee. 7. How can I learn more?  Ask your doctor.  Call your local or state health department.  Contact the Centers for Disease Control and Prevention (CDC):  Call 856-479-8491 (1-800-CDC-INFO) or  Visit CDC's website at http://hunter.com/ CDC PCV13 Vaccine VIS (Interim) (09/09/11) Document Released: 04/26/2006 Document Revised: 11/13/2013 Document Reviewed: 08/18/2013 [facility] Patient Information 2015 [place], [place]. This information is not intended to replace advice given to you by your health care provider. Make sure you discuss any questions you have with your health care provider.  Tdap Vaccine (Tetanus, Diphtheria, Pertussis): What You Need to Know 1. Why get vaccinated? Tetanus, diphtheria and pertussis  can be very serious diseases, even for adolescents and adults. Tdap vaccine can protect [place] from these diseases. TETANUS (Lockjaw) causes painful muscle tightening and stiffness, usually all over the body.  It can lead to tightening of muscles in the head and neck so you can't open your mouth, swallow, or sometimes even breathe. Tetanus kills about 1 out of 5 people who are infected. DIPHTHERIA can cause a thick coating to form in the back of the throat.  It can lead to breathing problems, paralysis, heart failure, and death. PERTUSSIS (Whooping Cough) causes severe coughing spells, which can cause difficulty breathing, vomiting and disturbed sleep.  It can also lead to weight loss, incontinence, and rib fractures. Up to 2 in 100 adolescents and 5 in 100 adults with pertussis are hospitalized or have complications, which could include pneumonia or death. These diseases are caused by bacteria. Diphtheria and pertussis are spread from person to person through coughing or sneezing. Tetanus enters the body through cuts, scratches, or wounds. Before vaccines, the [place] saw as many as 200,000 cases a year of diphtheria and pertussis, and hundreds of cases of tetanus. Since vaccination began, tetanus and diphtheria have dropped by about 99% and pertussis by about 80%. 2. Tdap vaccine Tdap vaccine can protect adolescents and adults from tetanus, diphtheria, and pertussis. One dose of Tdap is routinely given at age 37 or 50. People who did not get Tdap at that age should get it as soon as possible. Tdap is especially important  for health care professionals and anyone having close contact with a baby younger than 12 months. Pregnant women should get a dose of Tdap during every pregnancy, to protect the newborn from pertussis. Infants are most at risk for severe, life-threatening complications from pertussis. A similar vaccine, called Td, protects from tetanus and diphtheria, but not pertussis. A Td  booster should be given every 10 years. Tdap may be given as one of these boosters if you have not already gotten a dose. Tdap may also be given after a severe cut or burn to prevent tetanus infection. Your doctor can give you more information. Tdap may safely be given at the same time as other vaccines. 3. Some people should not get this vaccine  If you ever had a life-threatening allergic reaction after a dose of any tetanus, diphtheria, or pertussis containing vaccine, OR if you have a severe allergy to any part of this vaccine, you should not get Tdap. Tell your doctor if you have any severe allergies.  If you had a coma, or long or multiple seizures within 7 days after a childhood dose of DTP or DTaP, you should not get Tdap, unless a cause other than the vaccine was found. You can still get Td.  Talk to your doctor if you:  have epilepsy or another nervous system problem,  had severe pain or swelling after any vaccine containing diphtheria, tetanus or pertussis,  ever had Guillain-Barr Syndrome (GBS),  aren't feeling well on the day the shot is scheduled. 4. Risks of a vaccine reaction With any medicine, including vaccines, there is a chance of side effects. These are usually mild and go away on their own, but serious reactions are also possible. Brief fainting spells can follow a vaccination, leading to injuries from falling. Sitting or lying down for about 15 minutes can help prevent these. Tell your doctor if you feel dizzy or light-headed, or have vision changes or ringing in the ears. Mild problems following Tdap (Did not interfere with activities)  Pain where the shot was given (about 3 in 4 adolescents or 2 in 3 adults)  Redness or swelling where the shot was given (about 1 person in 5)  Mild fever of at least 100.25F (up to about 1 in 25 adolescents or 1 in 100 adults)  Headache (about 3 or 4 people in 10)  Tiredness (about 1 person in 3 or 4)  Nausea, vomiting,  diarrhea, stomach ache (up to 1 in 4 adolescents or 1 in 10 adults)  Chills, body aches, sore joints, rash, swollen glands (uncommon) Moderate problems following Tdap (Interfered with activities, but did not require medical attention)  Pain where the shot was given (about 1 in 5 adolescents or 1 in 100 adults)  Redness or swelling where the shot was given (up to about 1 in 16 adolescents or 1 in 25 adults)  Fever over 102F (about 1 in 100 adolescents or 1 in 250 adults)  Headache (about 3 in 20 adolescents or 1 in 10 adults)  Nausea, vomiting, diarrhea, stomach ache (up to 1 or 3 people in 100)  Swelling of the entire arm where the shot was given (up to about 3 in 100). Severe problems following Tdap (Unable to perform usual activities; required medical attention)  Swelling, severe pain, bleeding and redness in the arm where the shot was given (rare). A severe allergic reaction could occur after any vaccine (estimated less than 1 in a million doses). 5. What if there is a  serious reaction? What should I look for?  Look for anything that concerns you, such as signs of a severe allergic reaction, very high fever, or behavior changes. Signs of a severe allergic reaction can include hives, swelling of the face and throat, difficulty breathing, a fast heartbeat, dizziness, and weakness. These would start a few minutes to a few hours after the vaccination. What should I do?  If you think it is a severe allergic reaction or other emergency that can't wait, call 9-1-1 or get the person to the nearest hospital. Otherwise, call your doctor.  Afterward, the reaction should be reported to the "Vaccine Adverse Event Reporting System" (VAERS). Your doctor might file this report, or you can do it yourself through the VAERS web site at www.vaers.SamedayNews.es, or by calling 6160945760. VAERS is only for reporting reactions. They do not give medical advice.  6. The National Vaccine Injury Compensation  Program The [employer] Vaccine Injury Compensation Program (VICP) is a federal program that was created to compensate people who may have been injured by certain vaccines. Persons who believe they may have been injured by a vaccine can learn about the program and about filing a claim by calling 3472664633 or visiting the [place] website at GoldCloset.com.ee. 7. How can I learn more?  Ask your doctor.  Call your local or state health department.  Contact the Centers for Disease Control and Prevention (CDC):  Call 947-720-1453 or visit CDC's website at http://hunter.com/. CDC Tdap Vaccine VIS (11/19/11) Document Released: 12/29/2011 Document Revised: 11/13/2013 Document Reviewed: 10/11/2013 [employer] Patient Information 2015 [place], [place]. This information is not intended to replace advice given to you by your health care provider. Make sure you discuss any questions you have with your health care provider.  visInfluenza Vaccine (Flu Vaccine, Inactivated or Recombinant) 2014-2015: What You Need to Know 1. Why get vaccinated? Influenza ("flu") is a contagious disease that spreads around the [place] every winter, usually between October and May. Flu is caused by influenza viruses, and is spread mainly by coughing, sneezing, and close contact. Anyone can get flu, but the risk of getting flu is highest among children. Symptoms come on suddenly and may last several days. They can include:  fever/chills  sore throat  muscle aches  fatigue  cough  headache  runny or stuffy nose Flu can make some people much sicker than others. These people include young children, people 21 and older, pregnant women, and people with certain health conditions-such as heart, lung or kidney disease, nervous system disorders, or a weakened immune system. Flu vaccination is especially important for these people, and anyone in close contact with them. Flu can also lead to pneumonia, and  make existing medical conditions worse. It can cause diarrhea and seizures in children. Each year thousands of people in the [place] die from flu, and many more are hospitalized. Flu vaccine is the best protection against flu and its complications. Flu vaccine also helps prevent spreading flu from person to person. 2. Inactivated and recombinant flu vaccines You are getting an injectable flu vaccine, which is either an "inactivated" or "recombinant" vaccine. These vaccines do not contain any live influenza virus. They are given by injection with a needle, and often called the "flu shot."  A different live, attenuated (weakened) influenza vaccine is sprayed into the nostrils. This vaccine is described in a separate Vaccine Information Statement. Flu vaccination is recommended every year. Some children 6 months through 47 years of age might need two doses during one  year. Flu viruses are always changing. Each year's flu vaccine is made to protect against 3 or 4 viruses that are likely to cause disease that year. Flu vaccine cannot prevent all cases of flu, but it is the best defense against the disease.  It takes about 2 weeks for protection to develop after the vaccination, and protection lasts several months to a year. Some illnesses that are not caused by influenza virus are often mistaken for flu. Flu vaccine will not prevent these illnesses. It can only prevent influenza. Some inactivated flu vaccine contains a very small amount of a mercury-based preservative called thimerosal. Studies have shown that thimerosal in vaccines is not harmful, but flu vaccines that do not contain a preservative are available. 3. Some people should not get this vaccine Tell the person who gives you the vaccine:  If you have any severe, life-threatening allergies. If you ever had a life-threatening allergic reaction after a dose of flu vaccine, or have a severe allergy to any part of this vaccine, including (for  example) an allergy to gelatin, antibiotics, or eggs, you may be advised not to get vaccinated. Most, but not all, types of flu vaccine contain a small amount of egg protein.  If you ever had Guillain-Barr Syndrome (a severe paralyzing illness, also called GBS). Some people with a history of GBS should not get this vaccine. This should be discussed with your doctor.  If you are not feeling well. It is usually okay to get flu vaccine when you have a mild illness, but you might be advised to wait until you feel better. You should come back when you are better. 4. Risks of a vaccine reaction With a vaccine, like any medicine, there is a chance of side effects. These are usually mild and go away on their own. Problems that could happen after any vaccine:  Brief fainting spells can happen after any medical procedure, including vaccination. Sitting or lying down for about 15 minutes can help prevent fainting, and injuries caused by a fall. Tell your doctor if you feel dizzy, or have vision changes or ringing in the ears.  Severe shoulder pain and reduced range of motion in the arm where a shot was given can happen, very rarely, after a vaccination.  Severe allergic reactions from a vaccine are very rare, estimated at less than 1 in a million doses. If one were to occur, it would usually be within a few minutes to a few hours after the vaccination. Mild problems following inactivated flu vaccine:  soreness, redness, or swelling where the shot was given  hoarseness  sore, red or itchy eyes  cough  fever  aches  headache  itching  fatigue If these problems occur, they usually begin soon after the shot and last 1 or 2 days. Moderate problems following inactivated flu vaccine:  Young children who get inactivated flu vaccine and pneumococcal vaccine (PCV13) at the same time may be at increased risk for seizures caused by fever. Ask your doctor for more information. Tell your doctor if a  child who is getting flu vaccine has ever had a seizure. Inactivated flu vaccine does not contain live flu virus, so you cannot get the flu from this vaccine. As with any medicine, there is a very remote chance of a vaccine causing a serious injury or death. The safety of vaccines is always being monitored. For more information, visit: http://www.aguilar.org/ 5. What if there is a serious reaction? What should I look for?  Look for anything that concerns you, such as signs of a severe allergic reaction, very high fever, or behavior changes. Signs of a severe allergic reaction can include hives, swelling of the face and throat, difficulty breathing, a fast heartbeat, dizziness, and weakness. These would start a few minutes to a few hours after the vaccination. What should I do?  If you think it is a severe allergic reaction or other emergency that can't wait, call 9-1-1 and get the person to the nearest hospital. Otherwise, call your doctor.  Afterward, the reaction should be reported to the Vaccine Adverse Event Reporting System (VAERS). Your doctor should file this report, or you can do it yourself through the VAERS web site at www.vaers.SamedayNews.es, or by calling 817-368-9949. VAERS does not give medical advice. 6. The National Vaccine Injury Compensation Program The [employer] Vaccine Injury Compensation Program (VICP) is a federal program that was created to compensate people who may have been injured by certain vaccines. Persons who believe they may have been injured by a vaccine can learn about the program and about filing a claim by calling 386-815-1399 or visiting the [place] website at GoldCloset.com.ee. There is a time limit to file a claim for compensation. 7. How can I learn more?  Ask your health care provider.  Call your local or state health department.  Contact the Centers for Disease Control and Prevention (CDC):  Call 251-015-1652 (1-800-CDC-INFO)  or  Visit CDC's website at https://gibson.com/ CDC Vaccine Information Statement (Interim) Inactivated Influenza Vaccine (02/28/2013) Document Released: 04/23/2006 Document Revised: 11/13/2013 Document Reviewed: 06/16/2013 [facility] Patient Information 2015 [place]. This information is not intended to replace advice given to you by your health care provider. Make sure you discuss any questions you have with your health care provider.

## 2014-09-19 NOTE — Progress Notes (Signed)
Subjective:    Patient ID: Rodney Farley, male    DOB: March 26, 1932, 81 y.o.   MRN: [id number]  HPI Patient presents for DM f/u, with a new knee pain, and difficulty urinating in the early morning.   Patient Active Problem List   Diagnosis Date Noted  . Dyspnea on exertion 09/13/2014  . CAD (coronary artery disease), native coronary artery 09/13/2014  . GERD (gastroesophageal reflux disease) 08/07/2012  . Renal insufficiency 08/07/2012  . Depression 08/05/2012  . Sleep apnea 08/05/2012  . Diabetes type 2, uncontrolled 08/05/2012  . HTN (hypertension) 08/05/2012  . Ischemic cardiomyopathy 11/03/2011  . Electromechanical dissociation (EMD) with heart block   . Obesity   . Pacemaker    with his multiple problems follow-up has been a problem partially due to his depression. He has recently become interested once again in his health after intervention at home with a care coordinator and now the nutritionist.  States that he has been seeing his nutritionist and following her diet plan for him. Home glucose average is 132 and is checking daily. Has not been able to be as active and no longer walking dog because has dyspnea on exertion with walking to end of driveway. DOE is being evaluated by cardiologist and has Echo and PFT schedule within the next week because of his dyspnea on exertion which seems out of portion to his deconditioning.   Has had left knee pain for 2 weeks that usually occurs when standing up. Pain is 7/10 sharp pain that has radiated to thigh and calf. Has baseline tingling and numbness in feet, but is not feeling that in legs. Denies edema or joint swelling. No trauma or falls.   Has noticed gradual change in urination. Has incomplete emptying and generally returns to the bathroom up to 5x in the morning, has to push hard to get started, stream starts strong, but ends in dribbles. Denies hematuria or dysuria. Last prostate exam in the 1980s was normal. Has never seen  urology.  Health Maintenance: Colonoscopy referral made. Shingles vaccine ordered. Eye exam not UTD. Tdap, Pneumonia, and Flu vaccine given today.  NKDA.   Review of Systems  Constitutional: Positive for activity change. Negative for fever and appetite change.  HENT: Negative.   Respiratory: Positive for shortness of breath (with exertion). Negative for cough and wheezing.   Cardiovascular: Negative for chest pain, palpitations and leg swelling.  Gastrointestinal: Positive for diarrhea (baseline daily since metformin initiation). Negative for nausea, vomiting, abdominal pain, constipation and blood in stool.  Genitourinary: Positive for urgency, frequency, decreased urine volume and difficulty urinating. Negative for dysuria, hematuria, flank pain and enuresis.  Musculoskeletal: Positive for arthralgias. Negative for myalgias, back pain, joint swelling and gait problem.  Neurological: Positive for weakness and numbness. Negative for dizziness and headaches.       Objective:   Physical Exam  Constitutional: He is oriented to person, place, and time. He appears well-developed and well-nourished. No distress.  Blood pressure 186/83, pulse 68, temperature 97.9 F (36.6 C), resp. rate 16, height 5' 7.5" (1.715 m), weight 230 lb (104.327 kg), SpO2 96 %.  HENT:  Head: Normocephalic and atraumatic.  Right Ear: External ear normal.  Left Ear: External ear normal.  Nose: Nose normal.  Mouth/Throat: Oropharynx is clear and moist. No oropharyngeal exudate.  Eyes: Conjunctivae are normal. Pupils are equal, round, and reactive to light. Right eye exhibits no discharge. Left eye exhibits no discharge. No scleral icterus.  Neck: Normal range of  motion. Neck supple. No thyromegaly present.  Cardiovascular: Normal rate, regular rhythm, normal heart sounds and intact distal pulses.  Exam reveals no gallop and no friction rub.   No murmur heard. Pulmonary/Chest: Effort normal. No respiratory distress.  He has no wheezes. He has no rales.  Abdominal: Soft. Bowel sounds are normal.  Limited to body habitus  Musculoskeletal: He exhibits no edema or tenderness.  The left knee is not swollen. There is tenderness in the popliteal fossa without mass. Ligaments are stable to stress or. Patella baja ballots freely drawer is stable. There are no masses in the calf or posterior thigh.  Lymphadenopathy:    He has no cervical adenopathy.  Neurological: He is alert and oriented to person, place, and time. Coordination normal.  Skin: Skin is warm and dry. No rash noted. He is not diaphoretic. No erythema. No pallor.   Diabetic Foot Exam - Simple   Simple Foot Form  Diabetic Foot exam was performed with the following findings:  Yes 09/19/2014  1:43 PM  Visual Inspection  Sensation Testing  Pulse Check  Comments     Results for orders placed or performed in visit on 09/19/14  POCT urinalysis dipstick  Result Value Ref Range   Color, UA lt. yellow    Clarity, UA clear    Glucose, UA neg    Bilirubin, UA meg    Ketones, UA neg    Spec Grav, UA 1.020    Blood, UA trace-intace    pH, UA 6.5    Protein, UA neg    Urobilinogen, UA 0.2    Nitrite, UA neg    Leukocytes, UA Negative   POCT UA - Microscopic Only  Result Value Ref Range   WBC, Ur, HPF, POC neg    RBC, urine, microscopic neg    Bacteria, U Microscopic neg    Mucus, UA neg    Epithelial cells, urine per micros neg    Crystals, Ur, HPF, POC neg    Casts, Ur, LPF, POC neg    Yeast, UA neg    Wt Readings from Last 3 Encounters:  09/19/14 230 lb (104.327 kg)  09/13/14 230 lb 14.4 oz (104.736 kg)  06/18/14 233 lb 4.8 oz (105.824 kg)      Assessment & Plan:  1. Need for zoster vaccination - zoster vaccine live, PF, (ZOSTAVAX) 96295 UNT/0.65ML injection; Inject 19,400 Units into the skin once.  Dispense: 1 each; Refill: 0  2. Screening for colon cancer - Ambulatory referral to Gastroenterology  3. Obesity Following with  nutritionist.  4. Depression 5. Sleep apnea 6. Diabetes type 2, uncontrolled 7. Renal insufficiency Continue medication as prescribed. - POCT glycosylated hemoglobin (Hb A1C) - CBC with Differential/Platelet - Comprehensive metabolic panel - Lipid panel - TSH - Microalbumin, urine  8. Essential hypertension Elevated in office. Will take medication when gets home. Continue medication as prescribed.  9. Dyspnea on exertion 10. Ischemic cardiomyopathy Follow-up pending. Upcoming echo and PFTs.  11. Urinary frequency 12. BPH (benign prostatic hyperplasia) 13. Decreased urine stream - POCT urinalysis dipstick - POCT UA - Microscopic Only - PSA - tamsulosin (FLOMAX) 0.4 MG CAPS capsule; Take 1 capsule (0.4 mg total) by mouth daily.  Dispense: 30 capsule; Refill: 3  14. Need for influenza vaccination - Flu Vaccine QUAD 36+ mos IM  15. Need for prophylactic vaccination with combined diphtheria-tetanus-pertussis (DTP) vaccine - Tdap vaccine greater than or equal to 7yo IM  16. Need for prophylactic vaccination against Streptococcus pneumoniae (  pneumococcus) - Pneumococcal conjugate vaccine 13-valent IM  17. Knee pain, acute, left - AMB referral to orthopedics     Alveta Heimlich PA-C  [facility] 09/19/2014 3:24 PM I have participated in the care of this patient with the Advanced Practice Provider, taken my own history from the patient, performed on patient exam, and agree with Diagnosis and Plan as documented. Robert P. [name], M.D.

## 2014-09-20 ENCOUNTER — Ambulatory Visit (HOSPITAL_COMMUNITY)
Admission: RE | Admit: 2014-09-20 | Discharge: 2014-09-20 | Disposition: A | Discharge status: Home / Self Care | Payer: 59 | Source: Ambulatory Visit | Attending: Cardiology | Admitting: Cardiology

## 2014-09-20 ENCOUNTER — Encounter: Payer: Self-pay | Admitting: Internal Medicine

## 2014-09-20 ENCOUNTER — Other Ambulatory Visit: Payer: Self-pay | Admitting: Internal Medicine

## 2014-09-20 DIAGNOSIS — N289 Disorder of kidney and ureter, unspecified: Secondary | ICD-10-CM | POA: Diagnosis not present

## 2014-09-20 DIAGNOSIS — R0609 Other forms of dyspnea: Secondary | ICD-10-CM | POA: Diagnosis not present

## 2014-09-20 DIAGNOSIS — I255 Ischemic cardiomyopathy: Secondary | ICD-10-CM

## 2014-09-20 DIAGNOSIS — I10 Essential (primary) hypertension: Secondary | ICD-10-CM | POA: Insufficient documentation

## 2014-09-20 DIAGNOSIS — IMO0002 Reserved for concepts with insufficient information to code with codable children: Secondary | ICD-10-CM

## 2014-09-20 DIAGNOSIS — Z950 Presence of cardiac pacemaker: Secondary | ICD-10-CM | POA: Diagnosis not present

## 2014-09-20 DIAGNOSIS — I25119 Atherosclerotic heart disease of native coronary artery with unspecified angina pectoris: Secondary | ICD-10-CM | POA: Diagnosis present

## 2014-09-20 DIAGNOSIS — G4730 Sleep apnea, unspecified: Secondary | ICD-10-CM | POA: Diagnosis not present

## 2014-09-20 DIAGNOSIS — E1165 Type 2 diabetes mellitus with hyperglycemia: Secondary | ICD-10-CM | POA: Diagnosis not present

## 2014-09-20 LAB — PSA: PSA: 0.55 ng/mL (ref <=4.00)

## 2014-09-20 LAB — MICROALBUMIN, URINE: MICROALB UR: 68.3 mg/dL — AB (ref <2.0)

## 2014-09-20 NOTE — Progress Notes (Signed)
2D Echo Performed 09/20/2014    Marygrace Drought, RCS

## 2014-09-25 ENCOUNTER — Ambulatory Visit (HOSPITAL_COMMUNITY)
Admission: RE | Admit: 2014-09-25 | Discharge: 2014-09-25 | Disposition: A | Discharge status: Home / Self Care | Payer: 59 | Source: Ambulatory Visit | Attending: Cardiology | Admitting: Cardiology

## 2014-09-25 DIAGNOSIS — IMO0002 Reserved for concepts with insufficient information to code with codable children: Secondary | ICD-10-CM

## 2014-09-25 DIAGNOSIS — I25119 Atherosclerotic heart disease of native coronary artery with unspecified angina pectoris: Secondary | ICD-10-CM | POA: Diagnosis not present

## 2014-09-25 DIAGNOSIS — I10 Essential (primary) hypertension: Secondary | ICD-10-CM | POA: Diagnosis not present

## 2014-09-25 DIAGNOSIS — R0609 Other forms of dyspnea: Secondary | ICD-10-CM

## 2014-09-25 DIAGNOSIS — E1165 Type 2 diabetes mellitus with hyperglycemia: Secondary | ICD-10-CM | POA: Diagnosis not present

## 2014-09-25 DIAGNOSIS — Z950 Presence of cardiac pacemaker: Secondary | ICD-10-CM

## 2014-09-25 DIAGNOSIS — G4730 Sleep apnea, unspecified: Secondary | ICD-10-CM | POA: Diagnosis not present

## 2014-09-25 DIAGNOSIS — I255 Ischemic cardiomyopathy: Secondary | ICD-10-CM

## 2014-09-25 DIAGNOSIS — N289 Disorder of kidney and ureter, unspecified: Secondary | ICD-10-CM

## 2014-09-25 LAB — PULMONARY FUNCTION TEST
DL/VA % pred: 107 %
DL/VA: 4.74 ml/min/mmHg/L
DLCO unc % pred: 86 %
DLCO unc: 24.56 ml/min/mmHg
FEF 25-75 POST: 2.87 L/s
FEF 25-75 Pre: 1.83 L/sec
FEF2575-%Change-Post: 57 %
FEF2575-%PRED-POST: 171 %
FEF2575-%Pred-Pre: 109 %
FEV1-%CHANGE-POST: 10 %
FEV1-%PRED-POST: 97 %
FEV1-%Pred-Pre: 88 %
FEV1-POST: 2.41 L
FEV1-Pre: 2.18 L
FEV1FVC-%CHANGE-POST: 2 %
FEV1FVC-%Pred-Pre: 109 %
FEV6-%Change-Post: 8 %
FEV6-%Pred-Post: 92 %
FEV6-%Pred-Pre: 85 %
FEV6-POST: 3.02 L
FEV6-Pre: 2.78 L
FEV6FVC-%Change-Post: 0 %
FEV6FVC-%PRED-PRE: 106 %
FEV6FVC-%Pred-Post: 107 %
FVC-%CHANGE-POST: 7 %
FVC-%PRED-PRE: 79 %
FVC-%Pred-Post: 86 %
FVC-Post: 3.02 L
FVC-Pre: 2.8 L
PRE FEV1/FVC RATIO: 78 %
PRE FEV6/FVC RATIO: 99 %
Post FEV1/FVC ratio: 80 %
Post FEV6/FVC ratio: 100 %
RV % pred: 114 %
RV: 2.89 L
TLC % pred: 91 %
TLC: 5.88 L

## 2014-09-25 MED ORDER — ALBUTEROL SULFATE (2.5 MG/3ML) 0.083% IN NEBU
2.5000 mg | INHALATION_SOLUTION | Freq: Once | RESPIRATORY_TRACT | Status: AC
  Administered 2014-09-25: 2.5 mg via RESPIRATORY_TRACT

## 2014-09-28 ENCOUNTER — Telehealth: Payer: Self-pay

## 2014-09-28 ENCOUNTER — Ambulatory Visit (INDEPENDENT_AMBULATORY_CARE_PROVIDER_SITE_OTHER): Payer: 59 | Admitting: Cardiology

## 2014-09-28 ENCOUNTER — Encounter: Payer: Self-pay | Admitting: Cardiology

## 2014-09-28 VITALS — BP 140/86 | HR 82 | Ht 67.0 in | Wt 233.6 lb

## 2014-09-28 DIAGNOSIS — I255 Ischemic cardiomyopathy: Secondary | ICD-10-CM

## 2014-09-28 DIAGNOSIS — I25119 Atherosclerotic heart disease of native coronary artery with unspecified angina pectoris: Secondary | ICD-10-CM

## 2014-09-28 DIAGNOSIS — E1165 Type 2 diabetes mellitus with hyperglycemia: Secondary | ICD-10-CM

## 2014-09-28 DIAGNOSIS — IMO0002 Reserved for concepts with insufficient information to code with codable children: Secondary | ICD-10-CM

## 2014-09-28 DIAGNOSIS — N289 Disorder of kidney and ureter, unspecified: Secondary | ICD-10-CM

## 2014-09-28 DIAGNOSIS — I10 Essential (primary) hypertension: Secondary | ICD-10-CM

## 2014-09-28 NOTE — Telephone Encounter (Signed)
Spoke to patient right and left cardiac cath scheduled 10/17/14 at 8:30 am.To be admitted 10/16/14 for hydration.Spoke to [facility] in bed management hospital will call patient with a time to come in on 10/16/14.Patient to have pre cath lab work and cxr on 10/16/14.

## 2014-09-28 NOTE — Progress Notes (Signed)
Rodney Farley Date of Birth: 08-23-1931 Medical Record [id number]  History of Present Illness: Rodney Farley is seen for follow up of dyspnea. He is a pleasant 81 yo WM with extensive cardiac history. He is s/p MI x 2 in 1994 and 1995 while in [place]. He may have had angioplasty at that time but really cannot recall. He had an Adenosine Myoview in 2010 that showed an anteroseptal and apical scar without ischemia. EF 49%. Echo at that time showed septal and apical akinesis with EF 40-45%. He had complete heart block in 2010 and had a pacemaker placed at that time. He was lost to follow up until recently when he presented with increased dyspnea on exertion.    He has to stop several times walking up his driveway. He feels his chest pounding and has some chest discomfort but no true pain. These symptoms have progressed over the past year and are very limiting for him. No cough, PND, orthopnea, or edema. Sometimes running a humidifier with menthol helps. No wheezing. He does have a history of poorly controlled DM, CKD stage 3, HTN, and hyperlipidemia. He has a remote history of tobacco use and has a history of COPD and moderate sleep apnea.  Evaluation with Echo noted below. EF down to 35-40%. PFTs looked remarkably good.     Medication List       This list is accurate as of: 09/28/14 10:20 AM.  Always use your most recent med list.               aspirin 81 MG tablet  Take 81 mg by mouth daily.     atenolol-chlorthalidone 100-25 MG per tablet  Commonly known as:  TENORETIC  TAKE 1 TABLET BY MOUTH ONCE DAILY     atorvastatin 40 MG tablet  Commonly known as:  LIPITOR  Take 40 mg by mouth daily.     Insulin Glargine 100 UNIT/ML Solostar Pen  Commonly known as:  LANTUS SOLOSTAR  Inject 60 Units into the skin daily at 10 pm.     Insulin Pen Needle 31G X 5 MM Misc  Commonly known as:  B-D UF III MINI PEN NEEDLES  Use to test blood sugar 3 times daily. Dx code: 250.02     losartan  100 MG tablet  Commonly known as:  COZAAR  TAKE 1 TABLET BY MOUTH ONCE DAILY     metFORMIN 1000 MG tablet  Commonly known as:  GLUCOPHAGE  Take 1 tablet (1,000 mg total) by mouth 2 (two) times daily with a meal.     omeprazole 40 MG capsule  Commonly known as:  PRILOSEC  TAKE 1 CAPSULE (40 MG TOTAL) BY MOUTH DAILY.     ranitidine 300 MG tablet  Commonly known as:  ZANTAC  TAKE 0.5 TABLETS (150 MG TOTAL) BY MOUTH 2 (TWO) TIMES DAILY.     sertraline 100 MG tablet  Commonly known as:  ZOLOFT  Take 1 tablet (100 mg total) by mouth daily.     tamsulosin 0.4 MG Caps capsule  Commonly known as:  FLOMAX  Take 1 capsule (0.4 mg total) by mouth daily.     terazosin 1 MG capsule  Commonly known as:  HYTRIN  Take 1 mg by mouth at bedtime.     vitamin B-12 1000 MCG tablet  Commonly known as:  CYANOCOBALAMIN  Take 1,000 mcg by mouth daily.     zoster vaccine live (PF) 19400 UNT/0.65ML injection  Commonly known as:  ZOSTAVAX  Inject 19,400 Units into the skin once.        No Known Allergies  Past Medical History  Diagnosis Date  . COPD (chronic obstructive pulmonary disease)     Severe  . Hypercholesterolemia   . Depression   . Renal insufficiency   . Coronary artery disease   . Hypertension   . Atrioventricular block, complete   . Pacemaker     medtronic  . Obesity   . Sleep apnea   . Diabetes mellitus   . Old MI (myocardial infarction)   . Dyspnea on exertion 09/13/2014    Past Surgical History  Procedure Laterality Date  . Pacemaker insertion      Complete heart block status post DDD with good function  . Tonsillectomy    . Insert / replace / remove pacemaker    . Bladder removal    . Cholecystectomy      History   Social History  . Marital Status: Married    Spouse Name: N/A  . Number of Children: 1  . Years of Education: N/A   Social History Main Topics  . Smoking status: Former Research scientist (life sciences)  . Smokeless tobacco: Not on file  . Alcohol Use: Yes      Comment: Occa.  . Drug Use: No  . Sexual Activity: Not on file   Other Topics Concern  . None   Social History Narrative    History reviewed. No pertinent family history.  Review of Systems: The review of systems is positive for difficulty with urination at times. sometimes with straining his legs go weak. He does have peripheral neuropathy related to DM.  He has recent left leg pain and is scheduled for orthopedic evaluation with X rays and MRI. All other systems were reviewed and are negative.  Physical Exam: BP 140/86 mmHg  Pulse 82  Ht 5\' 7"  (1.702 m)  Wt 233 lb 9.6 oz (105.96 kg)  BMI 36.58 kg/m2 Filed Weights   09/28/14 0801  Weight: 233 lb 9.6 oz (105.96 kg)  GENERAL:  Well appearing, obese WM in NAD.  HEENT:  PERRL, EOMI, sclera are clear. Oropharynx is clear. NECK:  No jugular venous distention, carotid upstroke brisk and symmetric, no bruits, no thyromegaly or adenopathy LUNGS:  Clear to auscultation bilaterally CHEST:  Unremarkable HEART:  RRR,  PMI not displaced or sustained,S1 and S2 within normal limits, no S3, no S4: no clicks, no rubs, no murmurs ABD:  Soft, nontender. BS +, no masses or bruits. No hepatomegaly, no splenomegaly. Old midline surgical incision.  EXT:  2 + pulses throughout, no edema, no cyanosis no clubbing SKIN:  Warm and dry.  No rashes NEURO:  Alert and oriented x 3. Cranial nerves II through XII intact. PSYCH:  Cognitively intact    LABORATORY DATA: Transthoracic Echocardiography  Patient:  Rodney Farley, Rodney Farley MR #:    [id number] Study Date: 09/20/2014 Gender:   M Age:    30 Height:   170.2 cm Weight:   104.3 kg BSA:    2.26 m^2 Pt. Status: Room:  ATTENDING  Issak [name], M.D. ORDERING   Landy [name], M.D. REFERRING  Keng [name], M.D. REFERRING  [name] P SONOGRAPHER Marygrace Drought, RCS PERFORMING  [facility],  Outpatient  cc:  ------------------------------------------------------------------- LV EF: 35% -  40%  ------------------------------------------------------------------- Indications:   414.01 Coronary atherosclerosis - native artery.  ------------------------------------------------------------------- History:  PMH: Renal Insufficiency, Sleep Apnea, Coronary artery disease. Risk factors: Hypertension. Diabetes mellitus.  ------------------------------------------------------------------- Study Conclusions  - Left  ventricle: The cavity size was mildly dilated. Wall thickness was normal. Systolic function was moderately reduced. The estimated ejection fraction was in the range of 35% to 40%. Mid anteroseptal, apical septal and true apex akinesis. Mid inferoseptal and apical lateral hypokinesis. Doppler parameters are consistent with abnormal left ventricular relaxation (grade 1 diastolic dysfunction). - Aortic valve: There was no stenosis. - Mitral valve: Mildly calcified annulus. There was mild regurgitation. - Left atrium: The atrium was mildly dilated. - Right ventricle: The cavity size was normal. Pacer wire or catheter noted in right ventricle. Systolic function was normal. - Tricuspid valve: Peak RV-RA gradient (S): 21 mm Hg. - Pulmonary arteries: PA peak pressure: 24 mm Hg (S). - Inferior vena cava: The vessel was normal in size. The respirophasic diameter changes were in the normal range (= 50%), consistent with normal central venous pressure.  Impressions:  - Mildly dilated LV with moderate systolic dysfunction, EF 123456. Wall motion abnormalities as noted above. Normal RV size and systolic function. Mild MR.  Transthoracic echocardiography. M-mode, complete 2D, spectral Doppler, and color Doppler. Birthdate: Patient birthdate: 27-May-1932. Age: Patient is 81 yr old. Sex: Gender: male. BMI: 36 kg/m^2. Blood pressure:    141/76 Patient status: Outpatient. Study date: Study date: 09/20/2014. Study time: 11:14 AM. Location: Echo laboratory.  -------------------------------------------------------------------  ------------------------------------------------------------------- Left ventricle: The cavity size was mildly dilated. Wall thickness was normal. Systolic function was moderately reduced. The estimated ejection fraction was in the range of 35% to 40%. Mid anteroseptal, apical septal and true apex akinesis. Mid inferoseptal and apical lateral hypokinesis. Doppler parameters are consistent with abnormal left ventricular relaxation (grade 1 diastolic dysfunction).  ------------------------------------------------------------------- Aortic valve:  Trileaflet; mildly calcified leaflets. Doppler: There was no stenosis.  There was no regurgitation.  ------------------------------------------------------------------- Aorta: Aortic root: The aortic root was normal in size. Ascending aorta: The ascending aorta was normal in size.  ------------------------------------------------------------------- Mitral valve:  Mildly calcified annulus. Doppler:  There was no evidence for stenosis.  There was mild regurgitation.  Peak gradient (D): 2 mm Hg.  ------------------------------------------------------------------- Left atrium: The atrium was mildly dilated.  ------------------------------------------------------------------- Right ventricle: The cavity size was normal. Pacer wire or catheter noted in right ventricle. Systolic function was normal.  ------------------------------------------------------------------- Pulmonic valve:  Structurally normal valve.  Cusp separation was normal. Doppler: Transvalvular velocity was within the normal range. There was no regurgitation.  ------------------------------------------------------------------- Right atrium: The atrium was normal in  size.  ------------------------------------------------------------------- Pericardium: There was no pericardial effusion.  ------------------------------------------------------------------- Systemic veins: Inferior vena cava: The vessel was normal in size. The respirophasic diameter changes were in the normal range (= 50%), consistent with normal central venous pressure. Diameter: 16 mm.  ------------------------------------------------------------------- Measurements  IVC                     Value    Reference ID                     16  mm   ---------  Left ventricle               Value    Reference LV ID, ED, PLAX chordal       (L)   42.4 mm   43 - 52 LV fx shortening, PLAX chordal   (L)   16  %   >=29 LV PW thickness, ED             11.6 mm   --------- IVS/LV PW ratio, ED  0.91     <=1.3 Stroke volume, 2D              78  ml   --------- Stroke volume/bsa, 2D            34  ml/m^2 --------- LV ejection fraction, 1-p A4C        37  %   --------- LV e&', lateral               7.68 cm/s  --------- LV E/e&', lateral              9.32     --------- LV e&', medial                5.48 cm/s  --------- LV E/e&', medial               13.07    --------- LV e&', average               6.58 cm/s  --------- LV E/e&', average              10.88    ---------  Ventricular septum             Value    Reference IVS thickness, ED              10.5 mm   ---------  LVOT                    Value    Reference LVOT ID, S                 21  mm   --------- LVOT area                  3.46 cm^2  --------- LVOT  peak velocity, S            97  cm/s  --------- LVOT mean velocity, S            67.2 cm/s  --------- LVOT VTI, S                 22.6 cm   --------- LVOT peak gradient, S            4   mm Hg ---------  Aorta                    Value    Reference Aortic root ID, ED             35  mm   ---------  Left atrium                 Value    Reference LA ID, A-P, ES               42  mm   --------- LA ID/bsa, A-P               1.86 cm/m^2 <=2.2 LA volume, S                88  ml   --------- LA volume/bsa, S              38.9 ml/m^2 --------- LA volume, ES, 1-p A4C           86  ml   --------- LA volume/bsa, ES, 1-p A4C         38  ml/m^2 --------- LA volume, ES, 1-p A2C  85  ml   --------- LA volume/bsa, ES, 1-p A2C         37.6 ml/m^2 ---------  Mitral valve                Value    Reference Mitral E-wave peak velocity         71.6 cm/s  --------- Mitral A-wave peak velocity         109  cm/s  --------- Mitral deceleration time      (H)   282  ms   150 - 230 Mitral peak gradient, D           2   mm Hg --------- Mitral E/A ratio, peak           0.66     ---------  Pulmonary arteries             Value    Reference PA pressure, S, DP             24  mm Hg <=30  Tricuspid valve               Value    Reference Tricuspid regurg peak velocity       227  cm/s  --------- Tricuspid peak RV-RA gradient        21  mm Hg --------- Tricuspid maximal regurg velocity,     227  cm/s  --------- PISA  Systemic veins               Value     Reference Estimated CVP                3   mm Hg ---------  Right ventricle               Value    Reference RV s&', lateral, S              13.9 cm/s  ---------  Legend: (L) and (H) mark values outside specified reference range.  ------------------------------------------------------------------- Prepared and Electronically Authenticated by  Loralie Champagne, M.D. 2016-03-10T17:46:32  Assessment / Plan: 1. Severe dyspnea on exertion. This may be related to  coronary ischemia. No clinical evidence of CHF but EF is now moderate to severely reduced. He does have a history of moderate sleep apnea. PFTs looked good.  I have recommended a right and left heart cath to evaluate whether this is more ischemia or CHF related. With CKD will need overnight hydration and holding ARB and HCTZ for cath. If he needs PCI this may need to be staged depending on complexity. The procedure and risks were reviewed including but not limited to death, myocardial infarction, stroke, arrythmias, bleeding, transfusion, emergency surgery, dye allergy, or renal dysfunction. The patient voices understanding and is agreeable to proceed..  2. Complete heart block. S/p DDD pacer.  3. CAD with remote MI x 2.   4. Ischemic cardiomyopathy with history of mild LV dysfunction  5. DM with peripheral neuropathy.  6. CKD stage 3  7. HTN   8. Hyperlpidemia.

## 2014-10-02 ENCOUNTER — Other Ambulatory Visit: Payer: Self-pay | Admitting: Internal Medicine

## 2014-10-08 ENCOUNTER — Other Ambulatory Visit: Payer: Self-pay | Admitting: Family Medicine

## 2014-10-11 ENCOUNTER — Ambulatory Visit: Payer: 59

## 2014-10-16 ENCOUNTER — Other Ambulatory Visit: Payer: Self-pay | Admitting: Internal Medicine

## 2014-10-16 ENCOUNTER — Encounter (HOSPITAL_COMMUNITY): Payer: Self-pay | Admitting: General Practice

## 2014-10-16 ENCOUNTER — Observation Stay (HOSPITAL_COMMUNITY)
Admission: AD | Admit: 2014-10-16 | Discharge: 2014-10-17 | Disposition: A | Discharge status: Home / Self Care | Payer: 59 | Source: Ambulatory Visit | Attending: Cardiology | Admitting: Cardiology

## 2014-10-16 DIAGNOSIS — I129 Hypertensive chronic kidney disease with stage 1 through stage 4 chronic kidney disease, or unspecified chronic kidney disease: Principal | ICD-10-CM | POA: Insufficient documentation

## 2014-10-16 DIAGNOSIS — Z950 Presence of cardiac pacemaker: Secondary | ICD-10-CM | POA: Insufficient documentation

## 2014-10-16 DIAGNOSIS — G4730 Sleep apnea, unspecified: Secondary | ICD-10-CM | POA: Diagnosis not present

## 2014-10-16 DIAGNOSIS — E119 Type 2 diabetes mellitus without complications: Secondary | ICD-10-CM | POA: Diagnosis not present

## 2014-10-16 DIAGNOSIS — I200 Unstable angina: Secondary | ICD-10-CM | POA: Diagnosis not present

## 2014-10-16 DIAGNOSIS — F329 Major depressive disorder, single episode, unspecified: Secondary | ICD-10-CM | POA: Insufficient documentation

## 2014-10-16 DIAGNOSIS — I252 Old myocardial infarction: Secondary | ICD-10-CM | POA: Insufficient documentation

## 2014-10-16 DIAGNOSIS — I25708 Atherosclerosis of coronary artery bypass graft(s), unspecified, with other forms of angina pectoris: Secondary | ICD-10-CM | POA: Diagnosis present

## 2014-10-16 DIAGNOSIS — I255 Ischemic cardiomyopathy: Secondary | ICD-10-CM | POA: Diagnosis present

## 2014-10-16 DIAGNOSIS — E780 Pure hypercholesterolemia: Secondary | ICD-10-CM | POA: Diagnosis not present

## 2014-10-16 DIAGNOSIS — I25119 Atherosclerotic heart disease of native coronary artery with unspecified angina pectoris: Secondary | ICD-10-CM | POA: Diagnosis present

## 2014-10-16 DIAGNOSIS — I442 Atrioventricular block, complete: Secondary | ICD-10-CM | POA: Insufficient documentation

## 2014-10-16 DIAGNOSIS — J449 Chronic obstructive pulmonary disease, unspecified: Secondary | ICD-10-CM | POA: Insufficient documentation

## 2014-10-16 DIAGNOSIS — I209 Angina pectoris, unspecified: Secondary | ICD-10-CM | POA: Diagnosis present

## 2014-10-16 DIAGNOSIS — N184 Chronic kidney disease, stage 4 (severe): Secondary | ICD-10-CM

## 2014-10-16 DIAGNOSIS — K219 Gastro-esophageal reflux disease without esophagitis: Secondary | ICD-10-CM | POA: Insufficient documentation

## 2014-10-16 DIAGNOSIS — I2510 Atherosclerotic heart disease of native coronary artery without angina pectoris: Secondary | ICD-10-CM | POA: Insufficient documentation

## 2014-10-16 DIAGNOSIS — Z87891 Personal history of nicotine dependence: Secondary | ICD-10-CM | POA: Insufficient documentation

## 2014-10-16 DIAGNOSIS — N189 Chronic kidney disease, unspecified: Secondary | ICD-10-CM | POA: Diagnosis not present

## 2014-10-16 DIAGNOSIS — E669 Obesity, unspecified: Secondary | ICD-10-CM | POA: Insufficient documentation

## 2014-10-16 DIAGNOSIS — R0609 Other forms of dyspnea: Secondary | ICD-10-CM | POA: Diagnosis not present

## 2014-10-16 DIAGNOSIS — R0600 Dyspnea, unspecified: Secondary | ICD-10-CM | POA: Diagnosis present

## 2014-10-16 DIAGNOSIS — Z9981 Dependence on supplemental oxygen: Secondary | ICD-10-CM | POA: Insufficient documentation

## 2014-10-16 DIAGNOSIS — E1122 Type 2 diabetes mellitus with diabetic chronic kidney disease: Secondary | ICD-10-CM | POA: Diagnosis present

## 2014-10-16 DIAGNOSIS — N179 Acute kidney failure, unspecified: Secondary | ICD-10-CM | POA: Diagnosis not present

## 2014-10-16 DIAGNOSIS — Z992 Dependence on renal dialysis: Secondary | ICD-10-CM | POA: Diagnosis present

## 2014-10-16 HISTORY — DX: Angina pectoris, unspecified: I20.9

## 2014-10-16 HISTORY — DX: Polyneuropathy, unspecified: G62.9

## 2014-10-16 HISTORY — DX: Gastro-esophageal reflux disease without esophagitis: K21.9

## 2014-10-16 HISTORY — DX: Transient cerebral ischemic attack, unspecified: G45.9

## 2014-10-16 LAB — COMPREHENSIVE METABOLIC PANEL
ALBUMIN: 3.8 g/dL (ref 3.5–5.2)
ALK PHOS: 54 U/L (ref 39–117)
ALT: 29 U/L (ref 0–53)
AST: 27 U/L (ref 0–37)
Anion gap: 12 (ref 5–15)
BUN: 47 mg/dL — ABNORMAL HIGH (ref 6–23)
CO2: 21 mmol/L (ref 19–32)
Calcium: 9 mg/dL (ref 8.4–10.5)
Chloride: 104 mmol/L (ref 96–112)
Creatinine, Ser: 2.44 mg/dL — ABNORMAL HIGH (ref 0.50–1.35)
GFR calc Af Amer: 27 mL/min — ABNORMAL LOW (ref >90)
GFR calc non Af Amer: 23 mL/min — ABNORMAL LOW (ref >90)
Glucose, Bld: 122 mg/dL — ABNORMAL HIGH (ref 70–99)
POTASSIUM: 5.2 mmol/L — AB (ref 3.5–5.1)
Sodium: 137 mmol/L (ref 135–145)
TOTAL PROTEIN: 6.4 g/dL (ref 6.0–8.3)
Total Bilirubin: 0.8 mg/dL (ref 0.3–1.2)

## 2014-10-16 LAB — CBC WITH DIFFERENTIAL/PLATELET
BASOS ABS: 0 10*3/uL (ref 0.0–0.1)
BASOS PCT: 0 % (ref 0–1)
Eosinophils Absolute: 0.3 10*3/uL (ref 0.0–0.7)
Eosinophils Relative: 4 % (ref 0–5)
HCT: 38.2 % — ABNORMAL LOW (ref 39.0–52.0)
Hemoglobin: 12.9 g/dL — ABNORMAL LOW (ref 13.0–17.0)
LYMPHS PCT: 26 % (ref 12–46)
Lymphs Abs: 2 10*3/uL (ref 0.7–4.0)
MCH: 30.8 pg (ref 26.0–34.0)
MCHC: 33.8 g/dL (ref 30.0–36.0)
MCV: 91.2 fL (ref 78.0–100.0)
Monocytes Absolute: 0.7 10*3/uL (ref 0.1–1.0)
Monocytes Relative: 9 % (ref 3–12)
NEUTROS ABS: 4.7 10*3/uL (ref 1.7–7.7)
NEUTROS PCT: 61 % (ref 43–77)
PLATELETS: 193 10*3/uL (ref 150–400)
RBC: 4.19 MIL/uL — ABNORMAL LOW (ref 4.22–5.81)
RDW: 14.1 % (ref 11.5–15.5)
WBC: 7.7 10*3/uL (ref 4.0–10.5)

## 2014-10-16 LAB — GLUCOSE, CAPILLARY
Glucose-Capillary: 133 mg/dL — ABNORMAL HIGH (ref 70–99)
Glucose-Capillary: 109 mg/dL — ABNORMAL HIGH (ref 70–99)
Glucose-Capillary: 117 mg/dL — ABNORMAL HIGH (ref 70–99)

## 2014-10-16 LAB — APTT: APTT: 31 s (ref 24–37)

## 2014-10-16 LAB — PROTIME-INR
INR: 0.94 (ref 0.00–1.49)
PROTHROMBIN TIME: 12.7 s (ref 11.6–15.2)

## 2014-10-16 MED ORDER — TERAZOSIN HCL 1 MG PO CAPS
1.0000 mg | ORAL_CAPSULE | Freq: Every day | ORAL | Status: DC
  Administered 2014-10-16: 1 mg via ORAL
  Filled 2014-10-16 (×2): qty 1

## 2014-10-16 MED ORDER — INSULIN ASPART 100 UNIT/ML ~~LOC~~ SOLN: 0.0000 [IU] | Freq: Three times a day (TID) | SUBCUTANEOUS | Status: DC

## 2014-10-16 MED ORDER — ACETAMINOPHEN 325 MG PO TABS: 650.0000 mg | ORAL_TABLET | ORAL | Status: DC | PRN

## 2014-10-16 MED ORDER — ATENOLOL 25 MG PO TABS: 25.0000 mg | ORAL_TABLET | Freq: Every day | ORAL | Status: DC

## 2014-10-16 MED ORDER — ASPIRIN EC 81 MG PO TBEC
81.0000 mg | DELAYED_RELEASE_TABLET | Freq: Every day | ORAL | Status: DC
  Administered 2014-10-17: 81 mg via ORAL
  Filled 2014-10-16: qty 1

## 2014-10-16 MED ORDER — TERAZOSIN HCL 1 MG PO CAPS
1.0000 mg | ORAL_CAPSULE | Freq: Every day | ORAL | Status: DC
  Filled 2014-10-16: qty 1

## 2014-10-16 MED ORDER — TAMSULOSIN HCL 0.4 MG PO CAPS
0.4000 mg | ORAL_CAPSULE | Freq: Every day | ORAL | Status: DC
  Administered 2014-10-16: 0.4 mg via ORAL
  Filled 2014-10-16: qty 1

## 2014-10-16 MED ORDER — ATORVASTATIN CALCIUM 40 MG PO TABS: 40.0000 mg | ORAL_TABLET | Freq: Every day | ORAL | Status: DC

## 2014-10-16 MED ORDER — ONDANSETRON HCL 4 MG/2ML IJ SOLN: 4.0000 mg | Freq: Four times a day (QID) | INTRAMUSCULAR | Status: DC | PRN

## 2014-10-16 MED ORDER — TAMSULOSIN HCL 0.4 MG PO CAPS: 0.4000 mg | ORAL_CAPSULE | Freq: Every day | ORAL | Status: DC

## 2014-10-16 MED ORDER — NITROGLYCERIN 0.4 MG SL SUBL: 0.4000 mg | SUBLINGUAL_TABLET | SUBLINGUAL | Status: DC | PRN

## 2014-10-16 MED ORDER — ATORVASTATIN CALCIUM 40 MG PO TABS
40.0000 mg | ORAL_TABLET | Freq: Every day | ORAL | Status: DC
  Administered 2014-10-16: 40 mg via ORAL
  Filled 2014-10-16: qty 1

## 2014-10-16 MED ORDER — FAMOTIDINE 20 MG PO TABS
20.0000 mg | ORAL_TABLET | Freq: Every day | ORAL | Status: DC
  Filled 2014-10-16: qty 1

## 2014-10-16 MED ORDER — INSULIN GLARGINE 100 UNIT/ML ~~LOC~~ SOLN
60.0000 [IU] | Freq: Every day | SUBCUTANEOUS | Status: DC
  Administered 2014-10-16: 60 [IU] via SUBCUTANEOUS
  Filled 2014-10-16 (×2): qty 0.6

## 2014-10-16 MED ORDER — PANTOPRAZOLE SODIUM 40 MG PO TBEC: 40.0000 mg | DELAYED_RELEASE_TABLET | Freq: Every day | ORAL | Status: DC

## 2014-10-16 MED ORDER — SODIUM CHLORIDE 0.9 % IV SOLN
INTRAVENOUS | Status: DC
  Administered 2014-10-16 – 2014-10-17 (×2): via INTRAVENOUS

## 2014-10-16 MED ORDER — ALPRAZOLAM 0.25 MG PO TABS
0.2500 mg | ORAL_TABLET | Freq: Three times a day (TID) | ORAL | Status: DC | PRN
  Administered 2014-10-16 (×2): 0.25 mg via ORAL
  Filled 2014-10-16 (×2): qty 1

## 2014-10-16 MED ORDER — HEPARIN SODIUM (PORCINE) 5000 UNIT/ML IJ SOLN
5000.0000 [IU] | Freq: Three times a day (TID) | INTRAMUSCULAR | Status: DC
  Administered 2014-10-16 – 2014-10-17 (×3): 5000 [IU] via SUBCUTANEOUS
  Filled 2014-10-16 (×3): qty 1

## 2014-10-16 MED ORDER — SERTRALINE HCL 100 MG PO TABS
100.0000 mg | ORAL_TABLET | Freq: Every day | ORAL | Status: DC
  Administered 2014-10-17: 100 mg via ORAL
  Filled 2014-10-16: qty 1

## 2014-10-16 MED ORDER — PANTOPRAZOLE SODIUM 40 MG PO TBEC
40.0000 mg | DELAYED_RELEASE_TABLET | Freq: Every day | ORAL | Status: DC
  Administered 2014-10-16: 40 mg via ORAL
  Filled 2014-10-16: qty 1

## 2014-10-16 NOTE — Consult Note (Signed)
Rodney Farley is a pleasant 81 year old gentleman we were asked to see for acute rise in creatinine prior to a elective scheduled heart catheterization. Creatinine today was 2.72m/dl.  This compares with a creatinine of 1.746mdl on 09/19/14, and 1.90 on 12/31/11.  He reports chronic soft stools but no significant change or vomiting.  He takes a diuretic and ARB.  He has urgency, frequency and nocturia improved with flomax.  He has a longstanding history of hypertension usually poorly controlled. He also has a history of diabetes that has been suboptimally controlled.  There is no NSAID exposure. Urine microalbumin was positive, but recent UA neg for protein. He is [place] 2 [place] and had a pacemaker placed at that time. He  recently presented with increased dyspnea on exertion progressive over a long period of time..HMarland Kitchen has to stop several times walking up his driveway putting his trash away. He feels some chest discomfort like a headache. These symptoms have progressed over the past year. He has a remote history of tobacco use and has a history of COPD and moderate sleep apnea. Evaluation with recent  Echo EF 35-40%. PFTs done recently were wnl.  Past Medical History  Diagnosis Date  . COPD (chronic obstructive pulmonary disease)     Severe  . Hypercholesterolemia   . Depression   . Renal insufficiency   . Coronary artery disease   . Hypertension   . Atrioventricular block, complete   . Pacemaker     medtronic  . Obesity   . Sleep apnea   . Diabetes mellitus   . Old MI (myocardial infarction)   . Dyspnea on exertion 09/13/2014  . Anginal pain   . TIA (transient ischemic attack)     HISTORY OF   . Neuropathy     IN LOWER EXTREMITIES  . GERD (gastroesophageal reflux disease)    Past Surgical History  Procedure Laterality Date  . Pacemaker insertion      Complete heart block status post DDD with good function  .  Tonsillectomy    . Insert / replace / remove pacemaker    . Cholecystectomy    . Tonsillectomy     Social History:  reports that he quit smoking about 7 years ago. He has never used smokeless tobacco. He reports that he drinks alcohol. He reports that he does not use illicit drugs. Allergies:  Allergies  Allergen Reactions  . Bee Venom Anaphylaxis  . Zocor [Simvastatin] Nausea Only and Other (See Comments)    Headache with brand name only.  Can take the generic.   Medications:  Prior to Admission:  Prescriptions prior to admission  Medication Sig Dispense Refill Last Dose  . aspirin EC 81 MG tablet Take 81 mg by mouth daily.   10/16/2014 at Unknown time  . atenolol-chlorthalidone (TENORETIC) 100-25 MG per tablet TAKE 1 TABLET BY MOUTH ONCE DAILY 90 tablet 1 10/16/2014 at Unknown time  . atorvastatin (LIPITOR) 40 MG tablet Take 40 mg by mouth daily at 6 PM.    10/15/2014 at Unknown time  . Insulin Glargine (LANTUS SOLOSTAR) 100 UNIT/ML Solostar Pen Inject 60 Units into the skin daily at 10 pm. 5 pen PRN 10/15/2014 at Unknown time  . losartan (COZAAR) 100 MG tablet TAKE 1 TABLET BY MOUTH ONCE DAILY 90 tablet 1 10/16/2014 at Unknown time  . metFORMIN (GLUCOPHAGE) 1000 MG tablet Take 1 tablet (1,000 mg total) by  mouth 2 (two) times daily with a meal. 180 tablet 3 10/16/2014 at Unknown time  . Naphazoline-Pheniramine (EYE ALLERGY RELIEF OP) Apply 1 applicator to eye 4 (four) times daily.   10/15/2014 at Unknown time  . omeprazole (PRILOSEC) 40 MG capsule TAKE 1 CAPSULE BY MOUTH ONCE DAILY 90 capsule 0 10/15/2014 at Unknown time  . pramoxine-mineral oil-zinc (TUCKS) 1-12.5 % rectal ointment Place 1 application rectally every 2 (two) hours as needed for itching, irritation or hemorrhoids.   Past Week at Unknown time  . ranitidine (ZANTAC) 300 MG tablet TAKE 0.5 TABLETS (150 MG TOTAL) BY MOUTH 2 (TWO) TIMES DAILY. 90 tablet 2 10/16/2014 at Unknown time  . sertraline (ZOLOFT) 100 MG tablet Take 1 tablet (100 mg  total) by mouth daily. 90 tablet 3 10/16/2014 at Unknown time  . tamsulosin (FLOMAX) 0.4 MG CAPS capsule Take 1 capsule (0.4 mg total) by mouth daily. (Patient taking differently: Take 0.4 mg by mouth daily after supper. ) 30 capsule 3 10/15/2014 at Unknown time  . vitamin B-12 (CYANOCOBALAMIN) 1000 MCG tablet Take 1,000 mcg by mouth daily.   10/16/2014 at Unknown time  . Insulin Pen Needle (B-D UF III MINI PEN NEEDLES) 31G X 5 MM MISC Use to test blood sugar 3 times daily. Dx code: 250.02 100 each 11 Taking  . ranitidine (ZANTAC) 300 MG tablet TAKE 1/2 TABLET BY MOUTH 2 TIMES A DAY (Patient not taking: Reported on 10/16/2014) 90 tablet 0 Not Taking at Unknown time  . zoster vaccine live, PF, (ZOSTAVAX) 78588 UNT/0.65ML injection Inject 19,400 Units into the skin once. 1 each 0 Taking   Scheduled: . aspirin EC  81 mg Oral Daily  . atenolol  25 mg Oral Daily  . atorvastatin  40 mg Oral Daily  . famotidine  20 mg Oral Daily  . heparin  5,000 Units Subcutaneous 3 times per day  . insulin aspart  0-15 Units Subcutaneous TID WC  . insulin glargine  60 Units Subcutaneous Q2200  . pantoprazole  40 mg Oral Daily  . sertraline  100 mg Oral Daily  . tamsulosin  0.4 mg Oral Daily  . terazosin  1 mg Oral Q2000   ROS: as per HPI, occ leg cramps, no leg swelling  Blood pressure 137/66, pulse 67, temperature 98.2 F (36.8 C), temperature source Oral, resp. rate 18, height '5\' 7"'  (1.702 m), weight 102.558 kg (226 lb 1.6 oz), SpO2 96 %.  General appearance: alert, cooperative and british accent Head: Normocephalic, without obvious abnormality, atraumatic Eyes: negative Ears: normal TM's and external ear canals both ears Nose: Nares normal. Septum midline. Mucosa normal. No drainage or sinus tenderness. Throat: lips, mucosa, and tongue normal; teeth and gums normal Resp: clear to auscultation bilaterally Chest wall: no tenderness Cardio: regular rate and rhythm, S1, S2 normal, no murmur, click, rub or  gallop GI: soft, non-tender; bowel sounds normal; no masses,  no organomegaly Extremities: extremities normal, atraumatic, no cyanosis or edema Skin: Skin color, texture, turgor normal. No rashes or lesions Neurologic: appropriate  Results for orders placed or performed during the hospital encounter of 10/16/14 (from the past 48 hour(s))  Glucose, capillary     Status: Abnormal   Collection Time: 10/16/14 12:00 PM  Result Value Ref Range   Glucose-Capillary 133 (H) 70 - 99 mg/dL  Comprehensive metabolic panel     Status: Abnormal   Collection Time: 10/16/14  1:55 PM  Result Value Ref Range   Sodium 137 135 - 145 mmol/L  Potassium 5.2 (H) 3.5 - 5.1 mmol/L    Comment: HEMOLYZED SPECIMEN, RESULTS MAY BE AFFECTED   Chloride 104 96 - 112 mmol/L   CO2 21 19 - 32 mmol/L   Glucose, Bld 122 (H) 70 - 99 mg/dL   BUN 47 (H) 6 - 23 mg/dL   Creatinine, Ser 2.44 (H) 0.50 - 1.35 mg/dL   Calcium 9.0 8.4 - 10.5 mg/dL   Total Protein 6.4 6.0 - 8.3 g/dL   Albumin 3.8 3.5 - 5.2 g/dL   AST 27 0 - 37 U/L   ALT 29 0 - 53 U/L   Alkaline Phosphatase 54 39 - 117 U/L   Total Bilirubin 0.8 0.3 - 1.2 mg/dL   GFR calc non Af Amer 23 (L) >90 mL/min   GFR calc Af Amer 27 (L) >90 mL/min    Comment: (NOTE) The eGFR has been calculated using the CKD EPI equation. This calculation has not been validated in all clinical situations. eGFR's persistently <90 mL/min signify possible Chronic Kidney Disease.    Anion gap 12 5 - 15  Protime-INR     Status: None   Collection Time: 10/16/14  1:55 PM  Result Value Ref Range   Prothrombin Time 12.7 11.6 - 15.2 seconds   INR 0.94 0.00 - 1.49  APTT     Status: None   Collection Time: 10/16/14  1:55 PM  Result Value Ref Range   aPTT 31 24 - 37 seconds  CBC WITH DIFFERENTIAL     Status: Abnormal   Collection Time: 10/16/14  1:55 PM  Result Value Ref Range   WBC 7.7 4.0 - 10.5 K/uL   RBC 4.19 (L) 4.22 - 5.81 MIL/uL   Hemoglobin 12.9 (L) 13.0 - 17.0 g/dL   HCT 38.2  (L) 39.0 - 52.0 %   MCV 91.2 78.0 - 100.0 fL   MCH 30.8 26.0 - 34.0 pg   MCHC 33.8 30.0 - 36.0 g/dL   RDW 14.1 11.5 - 15.5 %   Platelets 193 150 - 400 K/uL   Neutrophils Relative % 61 43 - 77 %   Neutro Abs 4.7 1.7 - 7.7 K/uL   Lymphocytes Relative 26 12 - 46 %   Lymphs Abs 2.0 0.7 - 4.0 K/uL   Monocytes Relative 9 3 - 12 %   Monocytes Absolute 0.7 0.1 - 1.0 K/uL   Eosinophils Relative 4 0 - 5 %   Eosinophils Absolute 0.3 0.0 - 0.7 K/uL   Basophils Relative 0 0 - 1 %   Basophils Absolute 0.0 0.0 - 0.1 K/uL  Glucose, capillary     Status: Abnormal   Collection Time: 10/16/14  5:37 PM  Result Value Ref Range   Glucose-Capillary 109 (H) 70 - 99 mg/dL  Glucose, capillary     Status: Abnormal   Collection Time: 10/16/14  9:37 PM  Result Value Ref Range   Glucose-Capillary 117 (H) 70 - 99 mg/dL   Comment 1 Notify RN    No results found.  Assessment:  1 AKI, of uncertain cause ( hemodynamic, ARB, diuretic, BOO) 2 CKD stage 3, prob nephrosclerosis  3 Dyspnea, CAD cause to be ruled out Plan: 1 Hold on heart cath until AKI resolves 2 UA, renal ultrasound, SPEP 3 Agree with holding ARB and diuretic for now 4 Would not do coronary angio until AKI resolves.  Joseeduardo Brix C 10/16/2014, 10:00 PM

## 2014-10-16 NOTE — H&P (Signed)
[name] Date of Birth: 1931-12-07 Medical Record [id number]  History of Present Illness: Rodney Farley is seen for follow up of dyspnea. He is a pleasant 81 yo WM with extensive cardiac history. He is s/p MI x 2 in 1994 and 1995 while in [place]. He may have had angioplasty at that time but really cannot recall. He had an Adenosine Myoview in 2010 that showed an anteroseptal and apical scar without ischemia. EF 49%. Echo at that time showed septal and apical akinesis with EF 40-45%. He had complete heart block in 2010 and had a pacemaker placed at that time. He was lost to follow up until recently when he presented with increased dyspnea on exertion.  He has to stop several times walking up his driveway. He feels his chest pounding and has some chest discomfort but no true pain. These symptoms have progressed over the past year and are very limiting for him. No cough, PND, orthopnea, or edema. Sometimes running a humidifier with menthol helps. No wheezing. He does have a history of poorly controlled DM, CKD stage 3, HTN, and hyperlipidemia. He has a remote history of tobacco use and has a history of COPD and moderate sleep apnea.  Evaluation with Echo noted below. EF down to 35-40%. PFTs looked remarkably good.     Medication List       This list is accurate as of: 09/28/14 10:20 AM. Always use your most recent med list.              aspirin 81 MG tablet  Take 81 mg by mouth daily.     atenolol-chlorthalidone 100-25 MG per tablet  Commonly known as: TENORETIC  TAKE 1 TABLET BY MOUTH ONCE DAILY     atorvastatin 40 MG tablet  Commonly known as: LIPITOR  Take 40 mg by mouth daily.     Insulin Glargine 100 UNIT/ML Solostar Pen  Commonly known as: LANTUS SOLOSTAR  Inject 60 Units into the skin daily at 10 pm.     Insulin Pen Needle 31G X 5 MM Misc  Commonly known as: B-D UF III MINI PEN NEEDLES  Use to test blood sugar 3 times  daily. Dx code: 250.02     losartan 100 MG tablet  Commonly known as: COZAAR  TAKE 1 TABLET BY MOUTH ONCE DAILY     metFORMIN 1000 MG tablet  Commonly known as: GLUCOPHAGE  Take 1 tablet (1,000 mg total) by mouth 2 (two) times daily with a meal.     omeprazole 40 MG capsule  Commonly known as: PRILOSEC  TAKE 1 CAPSULE (40 MG TOTAL) BY MOUTH DAILY.     ranitidine 300 MG tablet  Commonly known as: ZANTAC  TAKE 0.5 TABLETS (150 MG TOTAL) BY MOUTH 2 (TWO) TIMES DAILY.     sertraline 100 MG tablet  Commonly known as: ZOLOFT  Take 1 tablet (100 mg total) by mouth daily.     tamsulosin 0.4 MG Caps capsule  Commonly known as: FLOMAX  Take 1 capsule (0.4 mg total) by mouth daily.     terazosin 1 MG capsule  Commonly known as: HYTRIN  Take 1 mg by mouth at bedtime.     vitamin B-12 1000 MCG tablet  Commonly known as: CYANOCOBALAMIN  Take 1,000 mcg by mouth daily.     zoster vaccine live (PF) 19400 UNT/0.65ML injection  Commonly known as: ZOSTAVAX  Inject 19,400 Units into the skin once.        No Known  Allergies  Past Medical History  Diagnosis Date  . COPD (chronic obstructive pulmonary disease)     Severe  . Hypercholesterolemia   . Depression   . Renal insufficiency   . Coronary artery disease   . Hypertension   . Atrioventricular block, complete   . Pacemaker     medtronic  . Obesity   . Sleep apnea   . Diabetes mellitus   . Old MI (myocardial infarction)   . Dyspnea on exertion 09/13/2014    Past Surgical History  Procedure Laterality Date  . Pacemaker insertion      Complete heart block status post DDD with good function  . Tonsillectomy    . Insert / replace / remove pacemaker    . Bladder removal    . Cholecystectomy      History   Social History  . Marital Status: Married    Spouse Name: N/A  .  Number of Children: 1  . Years of Education: N/A   Social History Main Topics  . Smoking status: Former Research scientist (life sciences)  . Smokeless tobacco: Not on file  . Alcohol Use: Yes     Comment: Occa.  . Drug Use: No  . Sexual Activity: Not on file   Other Topics Concern  . None   Social History Narrative    History reviewed. No pertinent family history.  Review of Systems: The review of systems is positive for difficulty with urination at times. sometimes with straining his legs go weak. He does have peripheral neuropathy related to DM. He has recent left leg pain and is scheduled for orthopedic evaluation with X rays and MRI. All other systems were reviewed and are negative.  Physical Exam: BP 140/86 mmHg  Pulse 82  Ht 5\' 7"  (1.702 m)  Wt 233 lb 9.6 oz (105.96 kg)  BMI 36.58 kg/m2 Filed Weights   09/28/14 0801  Weight: 233 lb 9.6 oz (105.96 kg)  GENERAL: Well appearing, obese WM in NAD.  HEENT: PERRL, EOMI, sclera are clear. Oropharynx is clear. NECK: No jugular venous distention, carotid upstroke brisk and symmetric, no bruits, no thyromegaly or adenopathy LUNGS: Clear to auscultation bilaterally CHEST: Unremarkable HEART: RRR, PMI not displaced or sustained,S1 and S2 within normal limits, no S3, no S4: no clicks, no rubs, no murmurs ABD: Soft, nontender. BS +, no masses or bruits. No hepatomegaly, no splenomegaly. Old midline surgical incision.  EXT: 2 + pulses throughout, no edema, no cyanosis no clubbing SKIN: Warm and dry. No rashes NEURO: Alert and oriented x 3. Cranial nerves II through XII intact. PSYCH: Cognitively intact    LABORATORY DATA: Transthoracic Echocardiography  Patient:  Rodney Farley, Rodney Farley MR #:    [id number] Study Date: 09/20/2014 Gender:   M Age:    40 Height:   170.2 cm Weight:   104.3 kg BSA:    2.26 m^2 Pt. Status: Room:  ATTENDING  Gatlyn Farley, M.D. ORDERING   Jadien  Farley, M.D. REFERRING  Barrett Farley, M.D. REFERRING  [name] P SONOGRAPHER Marygrace Drought, RCS PERFORMING  [facility], Outpatient  cc:  ------------------------------------------------------------------- LV EF: 35% -  40%  ------------------------------------------------------------------- Indications:   414.01 Coronary atherosclerosis - native artery.  ------------------------------------------------------------------- History:  PMH: Renal Insufficiency, Sleep Apnea, Coronary artery disease. Risk factors: Hypertension. Diabetes mellitus.  ------------------------------------------------------------------- Study Conclusions  - Left ventricle: The cavity size was mildly dilated. Wall thickness was normal. Systolic function was moderately reduced. The estimated ejection fraction was in the range of 35% to 40%. Mid anteroseptal,  apical septal and true apex akinesis. Mid inferoseptal and apical lateral hypokinesis. Doppler parameters are consistent with abnormal left ventricular relaxation (grade 1 diastolic dysfunction). - Aortic valve: There was no stenosis. - Mitral valve: Mildly calcified annulus. There was mild regurgitation. - Left atrium: The atrium was mildly dilated. - Right ventricle: The cavity size was normal. Pacer wire or catheter noted in right ventricle. Systolic function was normal. - Tricuspid valve: Peak RV-RA gradient (S): 21 mm Hg. - Pulmonary arteries: PA peak pressure: 24 mm Hg (S). - Inferior vena cava: The vessel was normal in size. The respirophasic diameter changes were in the normal range (= 50%), consistent with normal central venous pressure.  Impressions:  - Mildly dilated LV with moderate systolic dysfunction, EF 123456. Wall motion abnormalities as noted above. Normal RV size and systolic function. Mild MR.  Transthoracic echocardiography. M-mode, complete 2D, spectral Doppler, and color  Doppler. Birthdate: Patient birthdate: [birth year]. Age: Patient is 81 yr old. Sex: Gender: male. BMI: 36 kg/m^2. Blood pressure:   141/76 Patient status: Outpatient. Study date: Study date: 09/20/2014. Study time: 11:14 AM. Location: Echo laboratory.  -------------------------------------------------------------------  ------------------------------------------------------------------- Left ventricle: The cavity size was mildly dilated. Wall thickness was normal. Systolic function was moderately reduced. The estimated ejection fraction was in the range of 35% to 40%. Mid anteroseptal, apical septal and true apex akinesis. Mid inferoseptal and apical lateral hypokinesis. Doppler parameters are consistent with abnormal left ventricular relaxation (grade 1 diastolic dysfunction).  ------------------------------------------------------------------- Aortic valve:  Trileaflet; mildly calcified leaflets. Doppler: There was no stenosis.  There was no regurgitation.  ------------------------------------------------------------------- Aorta: Aortic root: The aortic root was normal in size. Ascending aorta: The ascending aorta was normal in size.  ------------------------------------------------------------------- Mitral valve:  Mildly calcified annulus. Doppler:  There was no evidence for stenosis.  There was mild regurgitation.  Peak gradient (D): 2 mm Hg.  ------------------------------------------------------------------- Left atrium: The atrium was mildly dilated.  ------------------------------------------------------------------- Right ventricle: The cavity size was normal. Pacer wire or catheter noted in right ventricle. Systolic function was normal.  ------------------------------------------------------------------- Pulmonic valve:  Structurally normal valve.  Cusp separation was normal. Doppler: Transvalvular velocity was within the  normal range. There was no regurgitation.  ------------------------------------------------------------------- Right atrium: The atrium was normal in size.  ------------------------------------------------------------------- Pericardium: There was no pericardial effusion.  ------------------------------------------------------------------- Systemic veins: Inferior vena cava: The vessel was normal in size. The respirophasic diameter changes were in the normal range (= 50%), consistent with normal central venous pressure. Diameter: 16 mm.  ------------------------------------------------------------------- Measurements  IVC                     Value    Reference ID                     16  mm   ---------  Left ventricle               Value    Reference LV ID, ED, PLAX chordal       (L)   42.4 mm   43 - 52 LV fx shortening, PLAX chordal   (L)   16  %   >=29 LV PW thickness, ED             11.6 mm   --------- IVS/LV PW ratio, ED             0.91     <=1.3 Stroke volume, 2D  78  ml   --------- Stroke volume/bsa, 2D            34  ml/m^2 --------- LV ejection fraction, 1-p A4C        37  %   --------- LV e&', lateral               7.68 cm/s  --------- LV E/e&', lateral              9.32     --------- LV e&', medial                5.48 cm/s  --------- LV E/e&', medial               13.07    --------- LV e&', average               6.58 cm/s  --------- LV E/e&', average              10.88    ---------  Ventricular septum             Value    Reference IVS thickness, ED              10.5 mm   ---------  LVOT                    Value     Reference LVOT ID, S                 21  mm   --------- LVOT area                  3.46 cm^2  --------- LVOT peak velocity, S            97  cm/s  --------- LVOT mean velocity, S            67.2 cm/s  --------- LVOT VTI, S                 22.6 cm   --------- LVOT peak gradient, S            4   mm Hg ---------  Aorta                    Value    Reference Aortic root ID, ED             35  mm   ---------  Left atrium                 Value    Reference LA ID, A-P, ES               42  mm   --------- LA ID/bsa, A-P               1.86 cm/m^2 <=2.2 LA volume, S                88  ml   --------- LA volume/bsa, S              38.9 ml/m^2 --------- LA volume, ES, 1-p A4C           86  ml   --------- LA volume/bsa, ES, 1-p A4C         38  ml/m^2 --------- LA volume, ES, 1-p A2C           85  ml   --------- LA volume/bsa, ES, 1-p A2C  37.6 ml/m^2 ---------  Mitral valve                Value    Reference Mitral E-wave peak velocity         71.6 cm/s  --------- Mitral A-wave peak velocity         109  cm/s  --------- Mitral deceleration time      (H)   282  ms   150 - 230 Mitral peak gradient, D           2   mm Hg --------- Mitral E/A ratio, peak           0.66     ---------  Pulmonary arteries             Value    Reference PA pressure, S, DP             24  mm Hg <=30  Tricuspid valve               Value    Reference Tricuspid regurg peak velocity       227  cm/s  --------- Tricuspid peak RV-RA gradient        21  mm Hg  --------- Tricuspid maximal regurg velocity,     227  cm/s  --------- PISA  Systemic veins               Value    Reference Estimated CVP                3   mm Hg ---------  Right ventricle               Value    Reference RV s&', lateral, S              13.9 cm/s  ---------  Legend: (L) and (H) mark values outside specified reference range.  ------------------------------------------------------------------- Prepared and Electronically Authenticated by  Loralie Champagne, M.D. 2016-03-10T17:46:32  Assessment / Plan: 1. Severe dyspnea on exertion. This may be related to coronary ischemia. No clinical evidence of CHF but EF is now moderate to severely reduced. He does have a history of moderate sleep apnea. PFTs looked good. I have recommended a right and left heart cath to evaluate whether this is more ischemia or CHF related. With CKD will need overnight hydration and holding ARB and HCTZ for cath. If he needs PCI this may need to be staged depending on complexity. The procedure and risks were reviewed including but not limited to death, myocardial infarction, stroke, arrythmias, bleeding, transfusion, emergency surgery, dye allergy, or renal dysfunction. The patient voices understanding and is agreeable to proceed..  2. Complete heart block. S/p DDD pacer.  3. CAD with remote MI x 2.   4. Ischemic cardiomyopathy with history of mild LV dysfunction  5. DM with peripheral neuropathy.  6. CKD stage 3. Creatinine was 1.58 one year ago. 1.77 3 weeks ago. Now 2.44. Recent microalbumin 68. Suspect this is related to his diabetes. Will ask nephrology to assess today. Will proceed with hydration as above and recheck BMET in am. May need to cancel contrast procedure depending on Renal input.   7. HTN   8. Hyperlpidemia.      Rodney [name] MD, [facility]  10/16/2014 5:21 PM

## 2014-10-17 ENCOUNTER — Observation Stay (HOSPITAL_COMMUNITY): Payer: 59

## 2014-10-17 DIAGNOSIS — I25119 Atherosclerotic heart disease of native coronary artery with unspecified angina pectoris: Secondary | ICD-10-CM

## 2014-10-17 DIAGNOSIS — R0609 Other forms of dyspnea: Secondary | ICD-10-CM | POA: Diagnosis not present

## 2014-10-17 DIAGNOSIS — N184 Chronic kidney disease, stage 4 (severe): Secondary | ICD-10-CM

## 2014-10-17 DIAGNOSIS — I255 Ischemic cardiomyopathy: Secondary | ICD-10-CM | POA: Diagnosis not present

## 2014-10-17 DIAGNOSIS — N179 Acute kidney failure, unspecified: Secondary | ICD-10-CM

## 2014-10-17 DIAGNOSIS — I129 Hypertensive chronic kidney disease with stage 1 through stage 4 chronic kidney disease, or unspecified chronic kidney disease: Secondary | ICD-10-CM | POA: Diagnosis not present

## 2014-10-17 DIAGNOSIS — Z950 Presence of cardiac pacemaker: Secondary | ICD-10-CM

## 2014-10-17 LAB — BASIC METABOLIC PANEL
ANION GAP: 10 (ref 5–15)
BUN: 41 mg/dL — ABNORMAL HIGH (ref 6–23)
CHLORIDE: 107 mmol/L (ref 96–112)
CO2: 23 mmol/L (ref 19–32)
CREATININE: 2.1 mg/dL — AB (ref 0.50–1.35)
Calcium: 8.8 mg/dL (ref 8.4–10.5)
GFR calc non Af Amer: 28 mL/min — ABNORMAL LOW (ref >90)
GFR, EST AFRICAN AMERICAN: 33 mL/min — AB (ref >90)
Glucose, Bld: 88 mg/dL (ref 70–99)
Potassium: 4.1 mmol/L (ref 3.5–5.1)
SODIUM: 140 mmol/L (ref 135–145)

## 2014-10-17 LAB — GLUCOSE, CAPILLARY
Glucose-Capillary: 75 mg/dL (ref 70–99)
Glucose-Capillary: 115 mg/dL — ABNORMAL HIGH (ref 70–99)

## 2014-10-17 MED ORDER — CARVEDILOL 12.5 MG PO TABS: 12.5000 mg | ORAL_TABLET | Freq: Two times a day (BID) | ORAL | Status: DC

## 2014-10-17 MED ORDER — CARVEDILOL 12.5 MG PO TABS
12.5000 mg | ORAL_TABLET | Freq: Two times a day (BID) | ORAL | Status: DC
  Administered 2014-10-17: 12.5 mg via ORAL
  Filled 2014-10-17: qty 1

## 2014-10-17 MED ORDER — NITROGLYCERIN 0.4 MG SL SUBL: 0.4000 mg | SUBLINGUAL_TABLET | SUBLINGUAL | Status: DC | PRN

## 2014-10-17 NOTE — Discharge Summary (Signed)
Discharge Summary   Patient ID: Rodney Farley,  MRN: [id number], DOB/AGE: August 18, 1931 81 y.o.  Admit date: 10/16/2014 Discharge date: 10/17/2014  Primary Care Provider: [name] Primary Cardiologist: P. [name], MD  Nephrology Consultant: A. Florene Glen, MD  Discharge Diagnoses Principal Problem:   Unstable angina  **Admitted for hydration prior to cath - cancelled 2/2 elevated creatinine. Active Problems:   Dyspnea on exertion   CAD (coronary artery disease), native coronary artery   Acute renal failure superimposed on stage 4 chronic kidney disease   Ischemic cardiomyopathy   Diabetes type 2, uncontrolled   Pacemaker   Allergies Allergies  Allergen Reactions  . Bee Venom Anaphylaxis  . Zocor [Simvastatin] Nausea Only and Other (See Comments)    Headache with brand name only.  Can take the generic.    Procedures  Renal ultrasound 10/17/2014  IMPRESSION: Normal size kidneys without hydronephrosis.   Bilateral renal cysts. 3.2 cm mass over the upper pole right kidney likely a complicated/ hemorrhagic cyst although indeterminate. If renal function permits, pre and post-contrast CT or MRI would be helpful for further characterization. Otherwise, follow-up ultrasound in 3 months is recommended. _____________   History of Present Illness  81 year old male with a prior history of coronary artery disease status post myocardial infarction in [place] in 1994 and again in 1995. He also has a history of complete heart block and is status post permanent pacemaker placement. Over the past year, he has been experiencing progressive dyspnea on exertion and chest discomfort. Recent echocardiogram showed an EF of 35-40%, which was a new finding. He was seen by Dr. [name] and decision was made to pursue diagnostic catheterization. As he has known stage III chronic kidney disease, a plan was made to have him admitted a day before catheterization for hydration.  Hospital  Course  Patient presented to [facility] on 10/16/2014 for precatheterization hydration. Labs were performed and revealed a BUN of 47 with a creatinine of 2.44. This was up from 1.77 on March 9. ARB, atenolol, diuretic, and metformin therapy were held. Given new rise in creatinine, nephrology was consulted and recommended cancellation of planned catheterization until renal function stabilizes. Urinalysis and SPEP were ordered. He also underwent renal ultrasound which showed normal size kidneys without hydronephrosis. He was also incidentally noted to have bilateral renal cysts and a 3.2 cm mass over the upper pole of the right kidney with recommendation for pre-and postcontrast CT or MRI if renal function permitted. If not, follow-up ultrasound in 3 months. Results of and discussed with patient. In the setting of acute kidney injury, he is not currently candidate for contrast. He will require follow-up imaging in 3 months. He will be discharged home today in good condition with plan for cardiology and nephrology follow-up within the next few weeks.  Discharge Vitals Blood pressure 143/68, pulse 64, temperature 98 F (36.7 C), temperature source Oral, resp. rate 18, height 5\' 7"  (1.702 m), weight 225 lb 4.8 oz (102.195 kg), SpO2 94 %.  Filed Weights   10/16/14 1213 10/17/14 0541  Weight: 226 lb 1.6 oz (102.558 kg) 225 lb 4.8 oz (102.195 kg)    Labs  CBC  Recent Labs  10/16/14 1355  WBC 7.7  NEUTROABS 4.7  HGB 12.9*  HCT 38.2*  MCV 91.2  PLT 0000000   Basic Metabolic Panel  Recent Labs  10/16/14 1355 10/17/14 0340  NA 137 140  K 5.2* 4.1  CL 104 107  CO2 21 23  GLUCOSE 122* 88  BUN 47* 41*  CREATININE 2.44* 2.10*  CALCIUM 9.0 8.8   Liver Function Tests  Recent Labs  10/16/14 1355  AST 27  ALT 29  ALKPHOS 54  BILITOT 0.8  PROT 6.4  ALBUMIN 3.8   Disposition  Pt is being discharged home today in good condition.  Follow-up Plans & Appointments  Follow-up Information     Follow up with Jaegar [name], MD.   Specialty:  Cardiology   Why:  2-3 wks - we will arrange and contact you.   Contact information:   [place] [id number] [phone]       Follow up with Estanislado Emms, MD.   Specialty:  Nephrology   Why:  Office will contact you for follow-up.   Contact information:   [place] [id number] [phone]       Discharge Medications    Medication List    STOP taking these medications        atenolol-chlorthalidone 100-25 MG per tablet  Commonly known as:  TENORETIC     losartan 100 MG tablet  Commonly known as:  COZAAR     metFORMIN 1000 MG tablet  Commonly known as:  GLUCOPHAGE      TAKE these medications        aspirin EC 81 MG tablet  Take 81 mg by mouth daily.     atorvastatin 40 MG tablet  Commonly known as:  LIPITOR  Take 40 mg by mouth daily at 6 PM.     carvedilol 12.5 MG tablet  Commonly known as:  COREG  Take 1 tablet (12.5 mg total) by mouth 2 (two) times daily with a meal.     EYE ALLERGY RELIEF OP  Apply 1 applicator to eye 4 (four) times daily.     Insulin Glargine 100 UNIT/ML Solostar Pen  Commonly known as:  LANTUS SOLOSTAR  Inject 60 Units into the skin daily at 10 pm.     Insulin Pen Needle 31G X 5 MM Misc  Commonly known as:  B-D UF III MINI PEN NEEDLES  Use to test blood sugar 3 times daily. Dx code: 250.02     nitroGLYCERIN 0.4 MG SL tablet  Commonly known as:  NITROSTAT  Place 1 tablet (0.4 mg total) under the tongue every 5 (five) minutes x 3 doses as needed for chest pain.     omeprazole 40 MG capsule  Commonly known as:  PRILOSEC  TAKE 1 CAPSULE BY MOUTH ONCE DAILY     pramoxine-mineral oil-zinc 1-12.5 % rectal ointment  Commonly known as:  TUCKS  Place 1 application rectally every 2 (two) hours as needed for itching, irritation or hemorrhoids.     ranitidine 300 MG tablet  Commonly known as:  ZANTAC  TAKE 0.5  TABLETS (150 MG TOTAL) BY MOUTH 2 (TWO) TIMES DAILY.     sertraline 100 MG tablet  Commonly known as:  ZOLOFT  Take 1 tablet (100 mg total) by mouth daily.     tamsulosin 0.4 MG Caps capsule  Commonly known as:  FLOMAX  Take 1 capsule (0.4 mg total) by mouth daily.     terazosin 1 MG capsule  Commonly known as:  HYTRIN  TAKE 1 CAPSULE BY MOUTH DAILY AT BEDTIME     vitamin B-12 1000 MCG tablet  Commonly known  as:  CYANOCOBALAMIN  Take 1,000 mcg by mouth daily.       Outstanding Labs/Studies  SPEP  Duration of Discharge Encounter   Greater than 30 minutes including physician time.  Signed, Murray Hodgkins NP 10/17/2014, 8:58 AM   Patient seen and examined and history reviewed. Agree with above findings and plan. See earlier rounding note.   Thurston [name], [place] 10/17/2014 1:01 PM

## 2014-10-17 NOTE — Discharge Instructions (Signed)
***  PLEASE REMEMBER TO BRING ALL OF YOUR MEDICATIONS TO EACH OF YOUR FOLLOW-UP OFFICE VISITS.  

## 2014-10-17 NOTE — Progress Notes (Signed)
TELEMETRY: Reviewed telemetry pt in AV paced rhythm: Filed Vitals:   10/16/14 1213 10/16/14 1958 10/17/14 0541  BP: 154/67 137/66 143/68  Pulse: 64 67 64  Temp: 97.7 F (36.5 C) 98.2 F (36.8 C) 98 F (36.7 C)  TempSrc: Oral Oral Oral  Resp: 18 18 18   Height: 5\' 7"  (1.702 m)    Weight: 226 lb 1.6 oz (102.558 kg)  225 lb 4.8 oz (102.195 kg)  SpO2:  96% 94%    Intake/Output Summary (Last 24 hours) at 10/17/14 0729 Last data filed at 10/17/14 0543  Gross per 24 hour  Intake   1187 ml  Output   1458 ml  Net   -271 ml   Filed Weights   10/16/14 1213 10/17/14 0541  Weight: 226 lb 1.6 oz (102.558 kg) 225 lb 4.8 oz (102.195 kg)    Subjective Feels well. No chest pain or dyspnea.  [name] aspirin EC  81 mg Oral Daily  . atenolol  25 mg Oral Daily  . atorvastatin  40 mg Oral Daily  . famotidine  20 mg Oral Daily  . heparin  5,000 Units Subcutaneous 3 times per day  . insulin aspart  0-15 Units Subcutaneous TID WC  . insulin glargine  60 Units Subcutaneous Q2200  . pantoprazole  40 mg Oral Daily  . sertraline  100 mg Oral Daily  . tamsulosin  0.4 mg Oral Daily  . terazosin  1 mg Oral Q2000   . sodium chloride 100 mL/hr at 10/17/14 0554    LABS: Basic Metabolic Panel:  Recent Labs  10/16/14 1355 10/17/14 0340  NA 137 140  K 5.2* 4.1  CL 104 107  CO2 21 23  GLUCOSE 122* 88  BUN 47* 41*  CREATININE 2.44* 2.10*  CALCIUM 9.0 8.8   Liver Function Tests:  Recent Labs  10/16/14 1355  AST 27  ALT 29  ALKPHOS 54  BILITOT 0.8  PROT 6.4  ALBUMIN 3.8   No results for input(s): LIPASE, AMYLASE in the last 72 hours. CBC:  Recent Labs  10/16/14 1355  WBC 7.7  NEUTROABS 4.7  HGB 12.9*  HCT 38.2*  MCV 91.2  PLT 193   Cardiac Enzymes: No results for input(s): CKTOTAL, CKMB, CKMBINDEX, TROPONINI in the last 72 hours. BNP: No results for input(s): PROBNP in the last 72 hours. D-Dimer: No results for input(s): DDIMER in the last 72 hours. Hemoglobin A1C: No  results for input(s): HGBA1C in the last 72 hours. Fasting Lipid Panel: No results for input(s): CHOL, HDL, LDLCALC, TRIG, CHOLHDL, LDLDIRECT in the last 72 hours. Thyroid Function Tests: No results for input(s): TSH, T4TOTAL, T3FREE, THYROIDAB in the last 72 hours.  Invalid input(s): FREET3   Radiology/Studies:  No results found.  PHYSICAL EXAM General: Well developed, obese, in no acute distress. Head: Normocephalic, atraumatic, sclera non-icteric, oropharynx is clear Neck: Negative for carotid bruits. JVD not elevated. No adenopathy Lungs: Clear bilaterally to auscultation without wheezes, rales, or rhonchi. Breathing is unlabored. Heart: RRR S1 S2 without murmurs, rubs, or gallops.  Abdomen: Soft, non-tender, non-distended with normoactive bowel sounds. No hepatomegaly. No rebound/guarding. No obvious abdominal masses. Msk:  Strength and tone appears normal for age. Extremities: No clubbing, cyanosis or edema.  Distal pedal pulses are 2+ and equal bilaterally. Neuro: Alert and oriented X 3. Moves all extremities spontaneously. Psych:  Responds to questions appropriately with a normal affect.  ASSESSMENT AND PLAN: 1. Acute on chronic renal failure stage 3-4. Appreciate renal input. Creatinine  improved today post hydration but still not at baseline. Work up with UA, SPEP, and Renal ultrasound. Continue to hold diuretics, ARB. Would like to avoid contrast studies for now until AKI resolved and renal function is at baseline. Since his cardiac status is not unstable will plan on discharge today with follow up as an outpatient. [facility] can be rescheduled at a later date. 2. Progressive dyspnea on exertion of uncertain cause.  3. CAD with remote MI x 2 4. CHB s/p pacemaker with dual chamber pacing. 5. Ischemic cardiomyopathy with EF 35-40%. Need to hold diuretics and ARB due to AKI. Will switch atenolol to carvedilol. 6. 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 HTN 8. HL. On  statin.   Present on Admission:  . Unstable angina . CAD (coronary artery disease), native coronary artery . Dyspnea on exertion . Diabetes type 2, uncontrolled . Pacemaker . Ischemic cardiomyopathy . Acute renal failure superimposed on stage 4 chronic kidney disease  Signed, Rodney Farley, [place] 10/17/2014 7:29 AM

## 2014-10-18 ENCOUNTER — Other Ambulatory Visit: Payer: Self-pay

## 2014-10-18 ENCOUNTER — Ambulatory Visit: Payer: 59

## 2014-10-18 LAB — PROTEIN ELECTROPHORESIS, SERUM
A/G Ratio: 1.3 (ref 0.7–2.0)
ALPHA-1-GLOBULIN: 0.2 g/dL (ref 0.1–0.4)
Albumin ELP: 3.3 g/dL (ref 3.2–5.6)
Alpha-2-Globulin: 0.9 g/dL (ref 0.4–1.2)
BETA GLOBULIN: 0.8 g/dL (ref 0.6–1.3)
GAMMA GLOBULIN: 0.7 g/dL (ref 0.5–1.6)
Globulin, Total: 2.6 g/dL (ref 2.0–4.5)
Total Protein ELP: 5.9 g/dL — ABNORMAL LOW (ref 6.0–8.5)

## 2014-10-18 SURGERY — LEFT AND RIGHT HEART CATHETERIZATION WITH CORONARY ANGIOGRAM
Anesthesia: LOCAL

## 2014-10-18 NOTE — Patient Outreach (Signed)
[facility]) Care Management  10/18/2014  JSEAN STOCKHAM 01-04-1932 [id number]  Transition of care call to check on members status after hospitalization for acute renal failure. Member states he is doing well and his wife is taking care of him. Completed transition of care form Plan to call member in one week for follow up. Parish Garter RN, [facility] Care Management [phone]

## 2014-10-25 ENCOUNTER — Other Ambulatory Visit: Payer: Self-pay

## 2014-10-25 NOTE — Patient Outreach (Signed)
[facility]) Care Management  10/25/2014  Rodney Farley [birth year] [id number] Phone call to follow up from [facility]) Care Management  10/25/2014  Rodney Farley 1931/09/14 [id number]  Phone call to check on members status from  hospitalization for acute renal failure. Member states that he is to go to have labs drawn next week and he will see Dr.Powell on 11/05/14.  States that his blood sugars had been good until he ate a soup with pasta and his sugar went up to over 300.  States he also ate some top ramen last night that made his sugar go up to 190 this AM Instructed to avoid pastas and top ramen since it is high in CHO and sodium.  Instructed to keep appts for labs and with Dr.Powell and [place] to call member in one week for follow up. Shraga Garter RN, [facility] Care Management [phone]

## 2014-11-02 ENCOUNTER — Other Ambulatory Visit: Payer: Self-pay

## 2014-11-02 NOTE — Patient Outreach (Signed)
[facility]) Care Management  11/02/2014  LENA SANTILLI Nov 04, 1931 [id number]  Phone call to follow up with member.  No answer and message left. Andray Garter RN, [facility]  [facility] Care Management [phone]

## 2014-11-06 ENCOUNTER — Encounter: Payer: Self-pay | Admitting: Internal Medicine

## 2014-11-06 ENCOUNTER — Ambulatory Visit (INDEPENDENT_AMBULATORY_CARE_PROVIDER_SITE_OTHER): Payer: 59 | Admitting: Internal Medicine

## 2014-11-06 ENCOUNTER — Ambulatory Visit: Payer: 59 | Admitting: Cardiology

## 2014-11-06 VITALS — BP 132/66 | HR 63 | Ht 67.0 in | Wt 227.4 lb

## 2014-11-06 DIAGNOSIS — I442 Atrioventricular block, complete: Secondary | ICD-10-CM | POA: Diagnosis not present

## 2014-11-06 DIAGNOSIS — Z45018 Encounter for adjustment and management of other part of cardiac pacemaker: Secondary | ICD-10-CM | POA: Diagnosis not present

## 2014-11-06 LAB — MDC_IDC_ENUM_SESS_TYPE_INCLINIC
Battery Impedance: 447 Ohm
Brady Statistic AS VP Percent: 12.5 %
Brady Statistic AS VS Percent: 0.1 % — CL
Lead Channel Impedance Value: 438 Ohm
Lead Channel Impedance Value: 566 Ohm
Lead Channel Pacing Threshold Amplitude: 0.75 V
Lead Channel Sensing Intrinsic Amplitude: 2 mV
Lead Channel Setting Pacing Amplitude: 2 V
Lead Channel Setting Pacing Pulse Width: 0.4 ms
Lead Channel Setting Sensing Sensitivity: 2.8 mV
MDC IDC MSMT BATTERY VOLTAGE: 2.79 V
MDC IDC MSMT LEADCHNL RA PACING THRESHOLD PULSEWIDTH: 0.4 ms
MDC IDC MSMT LEADCHNL RV PACING THRESHOLD AMPLITUDE: 0.75 V
MDC IDC MSMT LEADCHNL RV PACING THRESHOLD PULSEWIDTH: 0.4 ms
MDC IDC SET LEADCHNL RV PACING AMPLITUDE: 2.5 V
MDC IDC STAT BRADY AP VP PERCENT: 87.2 %
MDC IDC STAT BRADY AP VS PERCENT: 0.1 %

## 2014-11-06 NOTE — Patient Instructions (Signed)
Medication Instructions:  Your physician recommends that you continue on your current medications as directed. Please refer to the Current Medication list given to you today.  Labwork: None  Testing/Procedures: None  Follow-Up: Your physician recommends that you schedule a follow-up appointment in: 3 months with device clinic  Your physician wants you to follow-up in: 1 year with Dr. Caryl Comes.  You will receive a reminder letter in the mail two months in advance. If you don't receive a letter, please call our office to schedule the follow-up appointment.  Any Other Special Instructions Will Be Listed Below (If Applicable).

## 2014-11-06 NOTE — Progress Notes (Signed)
Patient Care Team: [name], MD as PCP - General (Family Medicine) Dimitri Ped, RN as Registered Nurse   HPI  Rodney Farley is a 81 y.o. male Seen in follow-up for a pacemaker implanted in 2010 elsewhere. We saw him to establish in 2013. He has a history of complete heart block.     He has history of ischemic heart disease with prior MI. Myoview 2002 demonstrated ejection fraction of 49% with prior MI but no ischemia. Echocardiogram 3/16 ejection fraction was 35-40% He was seen in March because of increasing dyspnea on exertion; catheterization was anticipated but the radial by worsening renal insufficiency.  Past Medical History  Diagnosis Date  . COPD (chronic obstructive pulmonary disease)     Severe  . Hypercholesterolemia   . Depression   . Renal insufficiency   . Coronary artery disease   . Hypertension   . Atrioventricular block, complete   . Pacemaker     medtronic  . Obesity   . Sleep apnea   . Diabetes mellitus   . Old MI (myocardial infarction)   . Dyspnea on exertion 09/13/2014  . Anginal pain   . TIA (transient ischemic attack)     HISTORY OF   . Neuropathy     IN LOWER EXTREMITIES  . GERD (gastroesophageal reflux disease)     Past Surgical History  Procedure Laterality Date  . Pacemaker insertion      Complete heart block status post DDD with good function  . Tonsillectomy    . Insert / replace / remove pacemaker    . Cholecystectomy    . Tonsillectomy      Current Outpatient Prescriptions  Medication Sig Dispense Refill  . aspirin EC 81 MG tablet Take 81 mg by mouth daily.    [name] atorvastatin (LIPITOR) 40 MG tablet Take 40 mg by mouth daily at 6 PM.     . carvedilol (COREG) 12.5 MG tablet Take 1 tablet (12.5 mg total) by mouth 2 (two) times daily with a meal. 60 tablet 6  . Insulin Glargine (LANTUS SOLOSTAR) 100 UNIT/ML Solostar Pen Inject 60 Units into the skin daily at 10 pm. 5 pen PRN  . Insulin Pen Needle (B-D UF  III MINI PEN NEEDLES) 31G X 5 MM MISC Use to test blood sugar 3 times daily. Dx code: 250.02 100 each 11  . nitroGLYCERIN (NITROSTAT) 0.4 MG SL tablet Place 1 tablet (0.4 mg total) under the tongue every 5 (five) minutes x 3 doses as needed for chest pain. 25 tablet 3  . omeprazole (PRILOSEC) 40 MG capsule TAKE 1 CAPSULE BY MOUTH ONCE DAILY 90 capsule 0  . ranitidine (ZANTAC) 300 MG tablet TAKE 0.5 TABLETS (150 MG TOTAL) BY MOUTH 2 (TWO) TIMES DAILY. 90 tablet 2  . sertraline (ZOLOFT) 100 MG tablet Take 1 tablet (100 mg total) by mouth daily. 90 tablet 3  . tamsulosin (FLOMAX) 0.4 MG CAPS capsule Take 1 capsule (0.4 mg total) by mouth daily. (Patient taking differently: Take 0.4 mg by mouth daily after supper. ) 30 capsule 3  . terazosin (HYTRIN) 1 MG capsule TAKE 1 CAPSULE BY MOUTH DAILY AT BEDTIME 90 capsule 1  . vitamin B-12 (CYANOCOBALAMIN) 1000 MCG tablet Take 1,000 mcg by mouth daily.     No current facility-administered medications for this visit.    Allergies  Allergen Reactions  . Bee Venom Anaphylaxis  . Zocor [Simvastatin] Nausea Only and Other (See Comments)  Headache with brand name only.  Can take the generic.    Review of Systems negative except from HPI and PMH  Physical Exam BP 132/66 mmHg  Pulse 63  Ht 5\' 7"  (1.702 m)  Wt 227 lb 6.4 oz (103.148 kg)  BMI 35.61 kg/m2 Well developed and well nourished in no acute distress HENT normal E scleral and icterus clear Neck Supple JVP flat; carotids brisk and full Clear to ausculation  Regular rate and rhythm, no murmurs gallops or rub Soft with active bowel sounds No clubbing cyanosis  Edema Alert and oriented, grossly normal motor and sensory function Skin Warm and Dry     Assessment and  Plan Complete heart block  Ischemic heart disease with prior MI  Pacemaker  Medtronic [name] device was interrogated.  The information was reviewed. No changes were made in the programming.      Hyperlipidemia  Renal insufficiency  Depression  We spent a long time discussing the physiology of dyspnea on exertion related to ischemic heart disease and RV apical pacing and the potential for dyssynchrony. At the end of this discussion he was in tears. He told me about the time after his heart attack that he was almost agoraphobic and unable to undertake the cares of life. He notes that he currently is staying inside 3-5 days a week. Previously has been treated with sertraline. Xanax that was given to him at the time of his catheterization was very mollifying  I called and spoke with his primary care physician Dr. [name]. He will arrange for repeat office evaluation to see if there is staying secondary done to address the anxiety/depression. I also raised with the patient the possibility that, not withstanding his age, that counseling might be of benefit.  At this juncture there is nothing to be done regarding his pacemaker. In the event that his ejection fraction falls, she might be a candidate for CRT upgrade.   We spent more than 50% of our >45 min visit in face to face counseling regarding the above

## 2014-11-12 ENCOUNTER — Ambulatory Visit: Payer: 59 | Admitting: Physician Assistant

## 2014-11-12 ENCOUNTER — Encounter: Payer: Self-pay | Admitting: Internal Medicine

## 2014-11-13 ENCOUNTER — Ambulatory Visit: Payer: Self-pay

## 2014-11-15 ENCOUNTER — Other Ambulatory Visit: Payer: Self-pay

## 2014-11-15 NOTE — Patient Outreach (Signed)
[place] [facility]) Care Management  11/15/2014  LOKI PRYDE 1932-06-05 [id number]  Phone call to follow up from members missed appointment on 11/13/14 and to  schedule appointment.  No answer and message left.  Plan to send appointment no show letter.  Deontre Garter RN, [facility] Care Management Coordinator-Link to [place] Management [phone]

## 2014-11-16 ENCOUNTER — Ambulatory Visit (INDEPENDENT_AMBULATORY_CARE_PROVIDER_SITE_OTHER): Payer: 59 | Admitting: Cardiology

## 2014-11-16 ENCOUNTER — Encounter: Payer: Self-pay | Admitting: Cardiology

## 2014-11-16 VITALS — BP 162/86 | HR 87 | Ht 67.0 in | Wt 231.6 lb

## 2014-11-16 DIAGNOSIS — I442 Atrioventricular block, complete: Secondary | ICD-10-CM

## 2014-11-16 DIAGNOSIS — N179 Acute kidney failure, unspecified: Secondary | ICD-10-CM | POA: Diagnosis not present

## 2014-11-16 DIAGNOSIS — I255 Ischemic cardiomyopathy: Secondary | ICD-10-CM

## 2014-11-16 DIAGNOSIS — I25119 Atherosclerotic heart disease of native coronary artery with unspecified angina pectoris: Secondary | ICD-10-CM

## 2014-11-16 DIAGNOSIS — N184 Chronic kidney disease, stage 4 (severe): Secondary | ICD-10-CM

## 2014-11-16 DIAGNOSIS — I10 Essential (primary) hypertension: Secondary | ICD-10-CM

## 2014-11-16 DIAGNOSIS — Z950 Presence of cardiac pacemaker: Secondary | ICD-10-CM

## 2014-11-16 NOTE — Patient Instructions (Signed)
Continue your current therapy  We will proceed with your cardiac evaluation when cleared by the Renal docs.

## 2014-11-16 NOTE — Progress Notes (Signed)
Rodney Farley Date of Birth: [birth year] Medical Record [id number]  History of Present Illness: Rodney Farley is seen for follow up of CAD. He is a pleasant 80 yo WM with extensive cardiac history. He is s/p MI x 2 in 1994 and 1995 while in [place]. He may have had angioplasty at that time but really cannot recall. He had an Adenosine Myoview in 2010 that showed an anteroseptal and apical scar without ischemia. EF 49%. Echo at that time showed septal and apical akinesis with EF 40-45%. He had complete heart block in 2010 and had a pacemaker placed at that time. He was lost to follow up until recently when he presented with increased dyspnea on exertion.     These symptoms have progressed over the past year and are very limiting for him. No cough, PND, orthopnea, or edema. Sometimes running a humidifier with menthol helps. No wheezing. He does have a history of poorly controlled DM, CKD stage 3, HTN, and hyperlipidemia. He has a remote history of tobacco use and has a history of COPD and moderate sleep apnea.  Evaluation with Echo noted below. EF down to 35-40%. PFTs looked remarkably good.   He was scheduled for a right and left heart cath recently and admitted for overnight hydration. Creatinine had increased from 1.77 to 2.44 and procedure was cancelled. Seen by Dr. Florene Glen with Nephrology. Atenolol HCT and losartan were stopped. So was metformin. On follow up today symptoms are the same. He did awake Tuesday with sudden gasping for breath and sweating. After nebulizer with menthol symptoms improved and it hasn't happened again. He is scheduled to see Renal in one week. Saw Dr. Caryl Comes last week and pacemaker function was OK. He discussed possible resynchronization therapy.    Medication List       This list is accurate as of: 11/16/14  2:09 PM.  Always use your most recent med list.               aspirin EC 81 MG tablet  Take 81 mg by mouth daily.     atorvastatin 40 MG tablet  Commonly  known as:  LIPITOR  Take 40 mg by mouth daily at 6 PM.     carvedilol 12.5 MG tablet  Commonly known as:  COREG  Take 1 tablet (12.5 mg total) by mouth 2 (two) times daily with a meal.     Insulin Glargine 100 UNIT/ML Solostar Pen  Commonly known as:  LANTUS SOLOSTAR  Inject 60 Units into the skin daily at 10 pm.     Insulin Pen Needle 31G X 5 MM Misc  Commonly known as:  B-D UF III MINI PEN NEEDLES  Use to test blood sugar 3 times daily. Dx code: 250.02     nitroGLYCERIN 0.4 MG SL tablet  Commonly known as:  NITROSTAT  Place 1 tablet (0.4 mg total) under the tongue every 5 (five) minutes x 3 doses as needed for chest pain.     omeprazole 40 MG capsule  Commonly known as:  PRILOSEC  TAKE 1 CAPSULE BY MOUTH ONCE DAILY     ranitidine 300 MG tablet  Commonly known as:  ZANTAC  TAKE 0.5 TABLETS (150 MG TOTAL) BY MOUTH 2 (TWO) TIMES DAILY.     sertraline 100 MG tablet  Commonly known as:  ZOLOFT  Take 1 tablet (100 mg total) by mouth daily.     tamsulosin 0.4 MG Caps capsule  Commonly known as:  FLOMAX  Take  1 capsule (0.4 mg total) by mouth daily.     terazosin 1 MG capsule  Commonly known as:  HYTRIN  TAKE 1 CAPSULE BY MOUTH DAILY AT BEDTIME     vitamin B-12 1000 MCG tablet  Commonly known as:  CYANOCOBALAMIN  Take 1,000 mcg by mouth daily.        Allergies  Allergen Reactions  . Bee Venom Anaphylaxis  . Zocor [Simvastatin] Nausea Only and Other (See Comments)    Headache with brand name only.  Can take the generic.    Past Medical History  Diagnosis Date  . COPD (chronic obstructive pulmonary disease)     Severe  . Hypercholesterolemia   . Depression   . Renal insufficiency   . Coronary artery disease   . Hypertension   . Atrioventricular block, complete   . Pacemaker     medtronic  . Obesity   . Sleep apnea   . Diabetes mellitus   . Old MI (myocardial infarction)   . Dyspnea on exertion 09/13/2014  . Anginal pain   . TIA (transient ischemic attack)      HISTORY OF   . Neuropathy     IN LOWER EXTREMITIES  . GERD (gastroesophageal reflux disease)     Past Surgical History  Procedure Laterality Date  . Pacemaker insertion      Complete heart block status post DDD with good function  . Tonsillectomy    . Insert / replace / remove pacemaker    . Cholecystectomy    . Tonsillectomy      History   Social History  . Marital Status: Married    Spouse Name: N/A  . Number of Children: 1  . Years of Education: N/A   Social History Main Topics  . Smoking status: Former Smoker    Quit date: 07/18/2007  . Smokeless tobacco: Never Used  . Alcohol Use: Yes     Comment: Occa.  . Drug Use: No  . Sexual Activity: Not on file   Other Topics Concern  . None   Social History Narrative    Family History  Problem Relation Age of Onset  . Stroke Mother   . Leukemia Father   . Stroke Sister   . Heart attack Brother     Review of Systems: The review of systems is positive for difficulty with urination - this has improved recently. All other systems were reviewed and are negative.  Physical Exam: BP 162/86 mmHg  Pulse 87  Ht 5\' 7"  (1.702 m)  Wt 231 lb 9.6 oz (105.053 kg)  BMI 36.27 kg/m2 Filed Weights   11/16/14 1322  Weight: 231 lb 9.6 oz (105.053 kg)  GENERAL:  Well appearing, obese WM in NAD.  HEENT:  PERRL, EOMI, sclera are clear. Oropharynx is clear. NECK:  No jugular venous distention, carotid upstroke brisk and symmetric, no bruits, no thyromegaly or adenopathy LUNGS:  Clear to auscultation bilaterally CHEST:  Unremarkable HEART:  RRR,  PMI not displaced or sustained,S1 and S2 within normal limits, no S3, no S4: no clicks, no rubs, no murmurs ABD:  Soft, nontender. BS +, no masses or bruits. No hepatomegaly, no splenomegaly. Old midline surgical incision.  EXT:  2 + pulses throughout, no edema, no cyanosis no clubbing SKIN:  Warm and dry.  No rashes NEURO:  Alert and oriented x 3. Cranial nerves II through XII  intact. PSYCH:  Cognitively intact    LABORATORY DATA: Transthoracic Echocardiography  Patient:  Rodney Farley, Rodney Farley MR #:  [id number] Study Date: 09/20/2014 Gender:   M Age:    84 Height:   170.2 cm Weight:   104.3 kg BSA:    2.26 m^2 Pt. Status: Room:  ATTENDING  Jamear [name], M.D. ORDERING   Dewey [name], M.D. REFERRING  Jaiquan [name], M.D. REFERRING  [name] P SONOGRAPHER Marygrace Drought, RCS PERFORMING  [facility]  cc:  ------------------------------------------------------------------- LV EF: 35% -  40%  ------------------------------------------------------------------- Indications:   414.01 Coronary atherosclerosis - native artery.  ------------------------------------------------------------------- History:  PMH: Renal Insufficiency, Sleep Apnea, Coronary artery disease. Risk factors: Hypertension. Diabetes mellitus.  ------------------------------------------------------------------- Study Conclusions  - Left ventricle: The cavity size was mildly dilated. Wall thickness was normal. Systolic function was moderately reduced. The estimated ejection fraction was in the range of 35% to 40%. Mid anteroseptal, apical septal and true apex akinesis. Mid inferoseptal and apical lateral hypokinesis. Doppler parameters are consistent with abnormal left ventricular relaxation (grade 1 diastolic dysfunction). - Aortic valve: There was no stenosis. - Mitral valve: Mildly calcified annulus. There was mild regurgitation. - Left atrium: The atrium was mildly dilated. - Right ventricle: The cavity size was normal. Pacer wire or catheter noted in right ventricle. Systolic function was normal. - Tricuspid valve: Peak RV-RA gradient (S): 21 mm Hg. - Pulmonary arteries: PA peak pressure: 24 mm Hg (S). - Inferior vena cava: The vessel was normal in size. The respirophasic diameter changes were in the  normal range (= 50%), consistent with normal central venous pressure.  Impressions:  - Mildly dilated LV with moderate systolic dysfunction, EF 123456. Wall motion abnormalities as noted above. Normal RV size and systolic function. Mild MR.  Transthoracic echocardiography. M-mode, complete 2D, spectral Doppler, and color Doppler. Birthdate: Patient birthdate: May 14, 1932. Age: Patient is 81 yr old. Sex: Gender: male. BMI: 36 kg/m^2. Blood pressure:   141/76 Patient status: Outpatient. Study date: Study date: 09/20/2014. Study time: 11:14 AM. Location: Echo laboratory.  -------------------------------------------------------------------  ------------------------------------------------------------------- Left ventricle: The cavity size was mildly dilated. Wall thickness was normal. Systolic function was moderately reduced. The estimated ejection fraction was in the range of 35% to 40%. Mid anteroseptal, apical septal and true apex akinesis. Mid inferoseptal and apical lateral hypokinesis. Doppler parameters are consistent with abnormal left ventricular relaxation (grade 1 diastolic dysfunction).  ------------------------------------------------------------------- Aortic valve:  Trileaflet; mildly calcified leaflets. Doppler: There was no stenosis.  There was no regurgitation.  ------------------------------------------------------------------- Aorta: Aortic root: The aortic root was normal in size. Ascending aorta: The ascending aorta was normal in size.  ------------------------------------------------------------------- Mitral valve:  Mildly calcified annulus. Doppler:  There was no evidence for stenosis.  There was mild regurgitation.  Peak gradient (D): 2 mm Hg.  ------------------------------------------------------------------- Left atrium: The atrium was mildly  dilated.  ------------------------------------------------------------------- Right ventricle: The cavity size was normal. Pacer wire or catheter noted in right ventricle. Systolic function was normal.  ------------------------------------------------------------------- Pulmonic valve:  Structurally normal valve.  Cusp separation was normal. Doppler: Transvalvular velocity was within the normal range. There was no regurgitation.  ------------------------------------------------------------------- Right atrium: The atrium was normal in size.  ------------------------------------------------------------------- Pericardium: There was no pericardial effusion.  ------------------------------------------------------------------- Systemic veins: Inferior vena cava: The vessel was normal in size. The respirophasic diameter changes were in the normal range (= 50%), consistent with normal central venous pressure. Diameter: 16 mm.  ------------------------------------------------------------------- Measurements  IVC                     Value    Reference ID  16  mm   ---------  Left ventricle               Value    Reference LV ID, ED, PLAX chordal       (L)   42.4 mm   43 - 52 LV fx shortening, PLAX chordal   (L)   16  %   >=29 LV PW thickness, ED             11.6 mm   --------- IVS/LV PW ratio, ED             0.91     <=1.3 Stroke volume, 2D              78  ml   --------- Stroke volume/bsa, 2D            34  ml/m^2 --------- LV ejection fraction, 1-p A4C        37  %   --------- LV e&', lateral               7.68 cm/s  --------- LV E/e&', lateral              9.32     --------- LV e&', medial                5.48 cm/s  --------- LV  E/e&', medial               13.07    --------- LV e&', average               6.58 cm/s  --------- LV E/e&', average              10.88    ---------  Ventricular septum             Value    Reference IVS thickness, ED              10.5 mm   ---------  LVOT                    Value    Reference LVOT ID, S                 21  mm   --------- LVOT area                  3.46 cm^2  --------- LVOT peak velocity, S            97  cm/s  --------- LVOT mean velocity, S            67.2 cm/s  --------- LVOT VTI, S                 22.6 cm   --------- LVOT peak gradient, S            4   mm Hg ---------  Aorta                    Value    Reference Aortic root ID, ED             35  mm   ---------  Left atrium                 Value    Reference LA ID, A-P, ES               42  mm   --------- LA ID/bsa, A-P  1.86 cm/m^2 <=2.2 LA volume, S                88  ml   --------- LA volume/bsa, S              38.9 ml/m^2 --------- LA volume, ES, 1-p A4C           86  ml   --------- LA volume/bsa, ES, 1-p A4C         38  ml/m^2 --------- LA volume, ES, 1-p A2C           85  ml   --------- LA volume/bsa, ES, 1-p A2C         37.6 ml/m^2 ---------  Mitral valve                Value    Reference Mitral E-wave peak velocity         71.6 cm/s  --------- Mitral A-wave peak velocity         109  cm/s  --------- Mitral deceleration time      (H)   282  ms   150 - 230 Mitral peak gradient, D           2   mm Hg --------- Mitral  E/A ratio, peak           0.66     ---------  Pulmonary arteries             Value    Reference PA pressure, S, DP             24  mm Hg <=30  Tricuspid valve               Value    Reference Tricuspid regurg peak velocity       227  cm/s  --------- Tricuspid peak RV-RA gradient        21  mm Hg --------- Tricuspid maximal regurg velocity,     227  cm/s  --------- PISA  Systemic veins               Value    Reference Estimated CVP                3   mm Hg ---------  Right ventricle               Value    Reference RV s&', lateral, S              13.9 cm/s  ---------  Legend: (L) and (H) mark values outside specified reference range.  ------------------------------------------------------------------- Prepared and Electronically Authenticated by  Loralie Champagne, M.D. 2016-03-10T17:46:32  Assessment / Plan: 1. Severe dyspnea on exertion. This may be related to  coronary ischemia. No clinical evidence of CHF but EF is now moderate to severely reduced. He does have a history of moderate sleep apnea. PFTs looked good.  I would still like to pursue  a right and left heart cath to evaluate whether this is more ischemia or CHF related. Need to await evaluation and clearance from Nephrology. With CKD would need overnight hydration prior to  cath. If he needs PCI this may need to be staged depending on complexity.   2. Complete heart block. S/p DDD pacer.  3. CAD with remote MI x 2.   4. Ischemic cardiomyopathy with history of mild LV dysfunction  5. DM with peripheral neuropathy.  6. CKD stage 3-4. Follow up lab work with Renal next week.  7. HTN   8. Hyperlpidemia.      Rodney Farley Date of Birth: [birth year] Medical Record [id number]  History of Present Illness: Rodney Farley is seen for follow  up of dyspnea. He is a pleasant 81 yo WM with extensive cardiac history. He is s/p MI x 2 in 1994 and 1995 while in [place]. He may have had angioplasty at that time but really cannot recall. He had an Adenosine Myoview in 2010 that showed an anteroseptal and apical scar without ischemia. EF 49%. Echo at that time showed septal and apical akinesis with EF 40-45%. He had complete heart block in 2010 and had a pacemaker placed at that time. He was lost to follow up until recently when he presented with increased dyspnea on exertion.    He has to stop several times walking up his driveway. He feels his chest pounding and has some chest discomfort but no true pain. These symptoms have progressed over the past year and are very limiting for him. No cough, PND, orthopnea, or edema. Sometimes running a humidifier with menthol helps. No wheezing. He does have a history of poorly controlled DM, CKD stage 3, HTN, and hyperlipidemia. He has a remote history of tobacco use and has a history of COPD and moderate sleep apnea.  Evaluation with Echo noted below. EF down to 35-40%. PFTs looked remarkably good.     Medication List       This list is accurate as of: 11/16/14  2:09 PM.  Always use your most recent med list.               aspirin EC 81 MG tablet  Take 81 mg by mouth daily.     atorvastatin 40 MG tablet  Commonly known as:  LIPITOR  Take 40 mg by mouth daily at 6 PM.     carvedilol 12.5 MG tablet  Commonly known as:  COREG  Take 1 tablet (12.5 mg total) by mouth 2 (two) times daily with a meal.     Insulin Glargine 100 UNIT/ML Solostar Pen  Commonly known as:  LANTUS SOLOSTAR  Inject 60 Units into the skin daily at 10 pm.     Insulin Pen Needle 31G X 5 MM Misc  Commonly known as:  B-D UF III MINI PEN NEEDLES  Use to test blood sugar 3 times daily. Dx code: 250.02     nitroGLYCERIN 0.4 MG SL tablet  Commonly known as:  NITROSTAT  Place 1 tablet (0.4 mg total) under the tongue every 5  (five) minutes x 3 doses as needed for chest pain.     omeprazole 40 MG capsule  Commonly known as:  PRILOSEC  TAKE 1 CAPSULE BY MOUTH ONCE DAILY     ranitidine 300 MG tablet  Commonly known as:  ZANTAC  TAKE 0.5 TABLETS (150 MG TOTAL) BY MOUTH 2 (TWO) TIMES DAILY.     sertraline 100 MG tablet  Commonly known as:  ZOLOFT  Take 1 tablet (100 mg total) by mouth daily.     tamsulosin 0.4 MG Caps capsule  Commonly known as:  FLOMAX  Take 1 capsule (0.4 mg total) by mouth daily.     terazosin 1 MG capsule  Commonly known as:  HYTRIN  TAKE 1 CAPSULE BY MOUTH DAILY AT BEDTIME     vitamin B-12 1000 MCG tablet  Commonly known as:  CYANOCOBALAMIN  Take 1,000 mcg by mouth daily.        Allergies  Allergen  Reactions  . Bee Venom Anaphylaxis  . Zocor [Simvastatin] Nausea Only and Other (See Comments)    Headache with brand name only.  Can take the generic.    Past Medical History  Diagnosis Date  . COPD (chronic obstructive pulmonary disease)     Severe  . Hypercholesterolemia   . Depression   . Renal insufficiency   . Coronary artery disease   . Hypertension   . Atrioventricular block, complete   . Pacemaker     medtronic  . Obesity   . Sleep apnea   . Diabetes mellitus   . Old MI (myocardial infarction)   . Dyspnea on exertion 09/13/2014  . Anginal pain   . TIA (transient ischemic attack)     HISTORY OF   . Neuropathy     IN LOWER EXTREMITIES  . GERD (gastroesophageal reflux disease)     Past Surgical History  Procedure Laterality Date  . Pacemaker insertion      Complete heart block status post DDD with good function  . Tonsillectomy    . Insert / replace / remove pacemaker    . Cholecystectomy    . Tonsillectomy      History   Social History  . Marital Status: Married    Spouse Name: N/A  . Number of Children: 1  . Years of Education: N/A   Social History Main Topics  . Smoking status: Former Smoker    Quit date: 07/18/2007  . Smokeless tobacco:  Never Used  . Alcohol Use: Yes     Comment: Occa.  . Drug Use: No  . Sexual Activity: Not on file   Other Topics Concern  . None   Social History Narrative    Family History  Problem Relation Age of Onset  . Stroke Mother   . Leukemia Father   . Stroke Sister   . Heart attack Brother     Review of Systems: The review of systems is positive for difficulty with urination at times. sometimes with straining his legs go weak. He does have peripheral neuropathy related to DM.  He has recent left leg pain and is scheduled for orthopedic evaluation with X rays and MRI. All other systems were reviewed and are negative.  Physical Exam: BP 162/86 mmHg  Pulse 87  Ht 5\' 7"  (1.702 m)  Wt 231 lb 9.6 oz (105.053 kg)  BMI 36.27 kg/m2 Filed Weights   11/16/14 1322  Weight: 231 lb 9.6 oz (105.053 kg)  GENERAL:  Well appearing, obese WM in NAD.  HEENT:  PERRL, EOMI, sclera are clear. Oropharynx is clear. NECK:  No jugular venous distention, carotid upstroke brisk and symmetric, no bruits, no thyromegaly or adenopathy LUNGS:  Clear to auscultation bilaterally CHEST:  Unremarkable HEART:  RRR,  PMI not displaced or sustained,S1 and S2 within normal limits, no S3, no S4: no clicks, no rubs, no murmurs ABD:  Soft, nontender. BS +, no masses or bruits. No hepatomegaly, no splenomegaly. Old midline surgical incision.  EXT:  2 + pulses throughout, no edema, no cyanosis no clubbing SKIN:  Warm and dry.  No rashes NEURO:  Alert and oriented x 3. Cranial nerves II through XII intact. PSYCH:  Cognitively intact    LABORATORY DATA: Transthoracic Echocardiography  Patient:  Rodney Farley, Rodney Farley MR #:    [id number] Study Date: 09/20/2014 Gender:   M Age:    58 Height:   170.2 cm Weight:   104.3 kg BSA:    2.26 m^2 Pt.  Status: Room:  ATTENDING  Jadden [name], M.D. ORDERING   Aundrea [name], M.D. REFERRING  Rondo [name], M.D. REFERRING  [name]  P SONOGRAPHER Marygrace Drought, RCS PERFORMING  [facility]  cc:  ------------------------------------------------------------------- LV EF: 35% -  40%  ------------------------------------------------------------------- Indications:   414.01 Coronary atherosclerosis - native artery.  ------------------------------------------------------------------- History:  PMH: Renal Insufficiency, Sleep Apnea, Coronary artery disease. Risk factors: Hypertension. Diabetes mellitus.  ------------------------------------------------------------------- Study Conclusions  - Left ventricle: The cavity size was mildly dilated. Wall thickness was normal. Systolic function was moderately reduced. The estimated ejection fraction was in the range of 35% to 40%. Mid anteroseptal, apical septal and true apex akinesis. Mid inferoseptal and apical lateral hypokinesis. Doppler parameters are consistent with abnormal left ventricular relaxation (grade 1 diastolic dysfunction). - Aortic valve: There was no stenosis. - Mitral valve: Mildly calcified annulus. There was mild regurgitation. - Left atrium: The atrium was mildly dilated. - Right ventricle: The cavity size was normal. Pacer wire or catheter noted in right ventricle. Systolic function was normal. - Tricuspid valve: Peak RV-RA gradient (S): 21 mm Hg. - Pulmonary arteries: PA peak pressure: 24 mm Hg (S). - Inferior vena cava: The vessel was normal in size. The respirophasic diameter changes were in the normal range (= 50%), consistent with normal central venous pressure.  Impressions:  - Mildly dilated LV with moderate systolic dysfunction, EF 123456. Wall motion abnormalities as noted above. Normal RV size and systolic function. Mild MR.  Transthoracic echocardiography. M-mode, complete 2D, spectral Doppler, and color Doppler. Birthdate: Patient birthdate: 03-09-1932. Age: Patient is 81 yr old.  Sex: Gender: male. BMI: 36 kg/m^2. Blood pressure:   141/76 Patient status: Outpatient. Study date: Study date: 09/20/2014. Study time: 11:14 AM. Location: Echo laboratory.  -------------------------------------------------------------------  ------------------------------------------------------------------- Left ventricle: The cavity size was mildly dilated. Wall thickness was normal. Systolic function was moderately reduced. The estimated ejection fraction was in the range of 35% to 40%. Mid anteroseptal, apical septal and true apex akinesis. Mid inferoseptal and apical lateral hypokinesis. Doppler parameters are consistent with abnormal left ventricular relaxation (grade 1 diastolic dysfunction).  ------------------------------------------------------------------- Aortic valve:  Trileaflet; mildly calcified leaflets. Doppler: There was no stenosis.  There was no regurgitation.  ------------------------------------------------------------------- Aorta: Aortic root: The aortic root was normal in size. Ascending aorta: The ascending aorta was normal in size.  ------------------------------------------------------------------- Mitral valve:  Mildly calcified annulus. Doppler:  There was no evidence for stenosis.  There was mild regurgitation.  Peak gradient (D): 2 mm Hg.  ------------------------------------------------------------------- Left atrium: The atrium was mildly dilated.  ------------------------------------------------------------------- Right ventricle: The cavity size was normal. Pacer wire or catheter noted in right ventricle. Systolic function was normal.  ------------------------------------------------------------------- Pulmonic valve:  Structurally normal valve.  Cusp separation was normal. Doppler: Transvalvular velocity was within the normal range. There was no  regurgitation.  ------------------------------------------------------------------- Right atrium: The atrium was normal in size.  ------------------------------------------------------------------- Pericardium: There was no pericardial effusion.  ------------------------------------------------------------------- Systemic veins: Inferior vena cava: The vessel was normal in size. The respirophasic diameter changes were in the normal range (= 50%), consistent with normal central venous pressure. Diameter: 16 mm.  ------------------------------------------------------------------- Measurements  IVC                     Value    Reference ID                     16  mm   ---------  Left ventricle  Value    Reference LV ID, ED, PLAX chordal       (L)   42.4 mm   43 - 52 LV fx shortening, PLAX chordal   (L)   16  %   >=29 LV PW thickness, ED             11.6 mm   --------- IVS/LV PW ratio, ED             0.91     <=1.3 Stroke volume, 2D              78  ml   --------- Stroke volume/bsa, 2D            34  ml/m^2 --------- LV ejection fraction, 1-p A4C        37  %   --------- LV e&', lateral               7.68 cm/s  --------- LV E/e&', lateral              9.32     --------- LV e&', medial                5.48 cm/s  --------- LV E/e&', medial               13.07    --------- LV e&', average               6.58 cm/s  --------- LV E/e&', average              10.88    ---------  Ventricular septum             Value    Reference IVS thickness, ED              10.5 mm   ---------  LVOT                    Value    Reference LVOT ID, S                  21  mm   --------- LVOT area                  3.46 cm^2  --------- LVOT peak velocity, S            97  cm/s  --------- LVOT mean velocity, S            67.2 cm/s  --------- LVOT VTI, S                 22.6 cm   --------- LVOT peak gradient, S            4   mm Hg ---------  Aorta                    Value    Reference Aortic root ID, ED             35  mm   ---------  Left atrium                 Value    Reference LA ID, A-P, ES               42  mm   --------- LA ID/bsa, A-P               1.86 cm/m^2 <=2.2 LA volume, S  88  ml   --------- LA volume/bsa, S              38.9 ml/m^2 --------- LA volume, ES, 1-p A4C           86  ml   --------- LA volume/bsa, ES, 1-p A4C         38  ml/m^2 --------- LA volume, ES, 1-p A2C           85  ml   --------- LA volume/bsa, ES, 1-p A2C         37.6 ml/m^2 ---------  Mitral valve                Value    Reference Mitral E-wave peak velocity         71.6 cm/s  --------- Mitral A-wave peak velocity         109  cm/s  --------- Mitral deceleration time      (H)   282  ms   150 - 230 Mitral peak gradient, D           2   mm Hg --------- Mitral E/A ratio, peak           0.66     ---------  Pulmonary arteries             Value    Reference PA pressure, S, DP             24  mm Hg <=30  Tricuspid valve               Value    Reference Tricuspid regurg peak velocity       227  cm/s  --------- Tricuspid peak RV-RA gradient        21  mm Hg --------- Tricuspid maximal regurg  velocity,     227  cm/s  --------- PISA  Systemic veins               Value    Reference Estimated CVP                3   mm Hg ---------  Right ventricle               Value    Reference RV s&', lateral, S              13.9 cm/s  ---------  Legend: (L) and (H) mark values outside specified reference range.  ------------------------------------------------------------------- Prepared and Electronically Authenticated by  Loralie Champagne, M.D. 2016-03-10T17:46:32  Assessment / Plan: 1. Severe dyspnea on exertion. This may be related to  coronary ischemia. No clinical evidence of CHF but EF is now moderate to severely reduced. He does have a history of moderate sleep apnea. PFTs looked good.  I have recommended a right and left heart cath to evaluate whether this is more ischemia or CHF related. With CKD will need overnight hydration and holding ARB and HCTZ for cath. If he needs PCI this may need to be staged depending on complexity. The procedure and risks were reviewed including but not limited to death, myocardial infarction, stroke, arrythmias, bleeding, transfusion, emergency surgery, dye allergy, or renal dysfunction. The patient voices understanding and is agreeable to proceed..  2. Complete heart block. S/p DDD pacer.  3. CAD with remote MI x 2.   4. Ischemic cardiomyopathy with history of mild LV dysfunction  5. DM with peripheral neuropathy.  6. CKD stage 3  7. HTN  8. Hyperlpidemia.

## 2014-11-23 ENCOUNTER — Telehealth: Payer: Self-pay | Admitting: Cardiology

## 2014-11-23 NOTE — Telephone Encounter (Signed)
Received records from [facility] for appointment with Dr [name] on 12/25/14.  Records given to [facility] (medical records) for Dr Doug Sou schedule on 12/25/14. lp

## 2014-11-26 ENCOUNTER — Other Ambulatory Visit: Payer: Self-pay | Admitting: Internal Medicine

## 2014-12-03 ENCOUNTER — Telehealth: Payer: Self-pay

## 2014-12-03 NOTE — Telephone Encounter (Signed)
Patient called no answer.Left message on personal voice mail to call me back.Dr.Jordan received office note from Dr.Powell.Advised ok to schedule a cardiac cath.

## 2014-12-03 NOTE — Telephone Encounter (Signed)
Received call from patient.Dr.Jordan received office note from Dr.Powell.Ok to schedule cath.Cardiac cath scheduled Fri 12/07/14.Dr.Jordan advised to admit the day before for hydration.Hospital will call 12/06/14 to be admitted.Will have pre cath lab work on admission.

## 2014-12-05 ENCOUNTER — Telehealth: Payer: Self-pay

## 2014-12-05 ENCOUNTER — Telehealth: Payer: Self-pay | Admitting: Cardiology

## 2014-12-05 NOTE — Telephone Encounter (Signed)
[place]

## 2014-12-05 NOTE — Telephone Encounter (Signed)
[facility] lab called, patient had presented for [employer]. He has preadmission tomorrow (5/26) for cath scheduled on 5/27 - I don't see orders standing for labwork in office - informed caller of this. Note per note and patient report that the patient was cleared by nephrology for procedure.  I checked Dr. [name] and Cheryls' baskets - do not see any encounters or notes for recommended labwork.  Informed them that typically labwork will be ordered/handled on admission date by extender staff. Understanding verbalized by patient.

## 2014-12-05 NOTE — Telephone Encounter (Signed)
SPOKE WITH [facility] AFTER TALKING  WITH CHARMAINE SHE EXPLAINED PROCEDURE DID NOT NEED T HAVE PRECERT PATIENT HAS [facility]

## 2014-12-06 ENCOUNTER — Ambulatory Visit (HOSPITAL_COMMUNITY): Admission: RE | Admit: 2014-12-06 | Payer: 59 | Source: Ambulatory Visit | Admitting: Cardiology

## 2014-12-07 ENCOUNTER — Encounter (HOSPITAL_COMMUNITY): Admission: RE | Payer: Self-pay | Source: Ambulatory Visit

## 2014-12-07 SURGERY — LEFT HEART CATH AND CORONARY ANGIOGRAPHY

## 2014-12-11 ENCOUNTER — Other Ambulatory Visit: Payer: Self-pay | Admitting: Internal Medicine

## 2014-12-21 ENCOUNTER — Ambulatory Visit: Payer: 59 | Admitting: Cardiology

## 2014-12-25 ENCOUNTER — Ambulatory Visit: Payer: 59 | Admitting: Cardiology

## 2015-01-02 ENCOUNTER — Encounter: Payer: Self-pay | Admitting: Internal Medicine

## 2015-01-02 ENCOUNTER — Ambulatory Visit (INDEPENDENT_AMBULATORY_CARE_PROVIDER_SITE_OTHER): Payer: 59 | Admitting: Internal Medicine

## 2015-01-02 DIAGNOSIS — R0609 Other forms of dyspnea: Secondary | ICD-10-CM | POA: Diagnosis not present

## 2015-01-02 DIAGNOSIS — F329 Major depressive disorder, single episode, unspecified: Secondary | ICD-10-CM | POA: Diagnosis not present

## 2015-01-02 DIAGNOSIS — E1165 Type 2 diabetes mellitus with hyperglycemia: Secondary | ICD-10-CM | POA: Diagnosis not present

## 2015-01-02 DIAGNOSIS — F32A Depression, unspecified: Secondary | ICD-10-CM

## 2015-01-02 DIAGNOSIS — IMO0002 Reserved for concepts with insufficient information to code with codable children: Secondary | ICD-10-CM

## 2015-01-02 DIAGNOSIS — N289 Disorder of kidney and ureter, unspecified: Secondary | ICD-10-CM

## 2015-01-02 DIAGNOSIS — E669 Obesity, unspecified: Secondary | ICD-10-CM

## 2015-01-02 LAB — BASIC METABOLIC PANEL
BUN: 25 mg/dL — ABNORMAL HIGH (ref 6–23)
CHLORIDE: 101 meq/L (ref 96–112)
CO2: 25 mEq/L (ref 19–32)
Calcium: 9.1 mg/dL (ref 8.4–10.5)
Creat: 1.79 mg/dL — ABNORMAL HIGH (ref 0.50–1.35)
Glucose, Bld: 247 mg/dL — ABNORMAL HIGH (ref 70–99)
POTASSIUM: 4.7 meq/L (ref 3.5–5.3)
Sodium: 136 mEq/L (ref 135–145)

## 2015-01-02 LAB — HEMOGLOBIN A1C
Hgb A1c MFr Bld: 7.5 % — ABNORMAL HIGH (ref <5.7)
Mean Plasma Glucose: 169 mg/dL — ABNORMAL HIGH (ref <117)

## 2015-01-02 MED ORDER — SERTRALINE HCL 100 MG PO TABS: 150.0000 mg | ORAL_TABLET | Freq: Every day | ORAL | Status: DC

## 2015-01-02 NOTE — Patient Instructions (Addendum)
For someone to talk to about your depression- [facility] [phone] Jackelyn [id number] [phone]  Try over the counter artificial tears (information below) If you continue to have dry, itchy eyes, you can try over the counter antihistamine eye drops like Opcon, Naphcon or generic equivalents.   Artificial Tears eye solution What is this medicine? ARTIFICIAL TEARS (ahr tuh FISH uhl teerz) eye solution soothes irritation and discomfort caused by dry eyes. This medicine may be used for other purposes; ask your health care provider or pharmacist if you have questions. COMMON BRAND NAME(S): Akwa Tears, Akwa Tears Renewed, Artificial Tears, Bion Tears, Blink Tears, Clear eyes, Clear eyes Outdoor Dry Eye Protection, FreshKote, GenTeal Mild, GenTeal Moderate, GenTeal PF, Gonak, Goniosoft, Hypo Tears, Isopto Tears, LiquiTears, Lubricating Plus, Moisture Eyes, Moisture Eyes Preservative Free, Natural Balance Tears, Nature's Tears, Puralube Tears, Refresh, Refresh Celluvisc, Refresh Contacts Comfort, Refresh Endura, Refresh Optive, Refresh Optive Sensitive, Refresh Plus, Refresh Tears, Retaine CMC, Systane Balance, Teargen, Tears Naturale Forte, Tears Naturale II, Tears Renewed, TheraTears, Visine Advanced, Visine Pure Tears, Visine Tears, Visine Tired Eye Relief, Viva What should I tell my health care provider before I take this medicine? -change in vision -eye infection or trauma -wear contact lenses -an unusual or allergic reaction to artificial tears, other medicines, foods, dyes, or preservatives -pregnant or trying to get pregnant -breast-feeding How should I use this medicine? This medicine is only for use in the eye. Do not take by mouth. Follow the directions on the label. Wash hands before and after use. Tilt the head back slightly and pull down the lower eyelid with your index finger to form a pouch. Try not to touch the tip of the dropper to your eye, fingertips, or any other surface.  Squeeze the prescribed number of drops (usually one or two drops) into the pouch. Close the eye gently for a few moments to allow the drops to be in contact with the eye. Use your medicine at regular intervals. Do not use your medicine more often than directed. Talk to your pediatrician regarding the use of this medicine in children. While this medicine may be used in children as young as 6 years for selected conditions, precautions do apply. Overdosage: If you think you have taken too much of this medicine contact a poison control center or emergency room at once. NOTE: This medicine is only for you. Do not share this medicine with others. What if I miss a dose? If you miss a dose, use it as soon as you can. If it is almost time for your next dose, use only that dose. Do not use double or extra doses. What may interact with this medicine? Interactions are not expected. If you are using other eye drops with this medicine, separate the application of the different eye drops by roughly 5 minutes. This ensures that the eye drops do not interfere with each other. If you are using both eye drops and an eye ointment, use the eye drops 10 minutes before the eye ointment so that the eye ointment does not interfere with the action of the drops. This list may not describe all possible interactions. Give your health care provider a list of all the medicines, herbs, non-prescription drugs, or dietary supplements you use. Also tell them if you smoke, drink alcohol, or use illegal drugs. Some items may interact with your medicine. What should I watch for while using this medicine? If you experience eye pain, changes in vision, continued redness or irritation of the  eye, or if your eye condition gets worse or lasts longer than 72 hours, discontinue use and consult your health care professional. To avoid contamination of this product, do not touch the tip of the container to any surface. Do not share this medicine with  others. If the product changes color or becomes cloudy, do not use. If you wear contact lenses, you should remove them before putting the drops in your eyes. Wait at least 15 minutes after putting the drops in your eyes before putting your contact lenses back in. What side effects may I notice from receiving this medicine? Side effects that you should report to your doctor or health care professional as soon as possible: -allergic reactions like skin rash, itching or hives, swelling of the face, lips, or tongue -change in vision -eye irritation or redness that gets worse or lasts more than 72 hours -eye pain Side effects that usually do not require medical attention (report to your doctor or health care professional if they continue or are bothersome): -temporary stinging or blurred vision when applying the eye drops This list may not describe all possible side effects. Call your doctor for medical advice about side effects. You may report side effects to FDA at 1-800-FDA-1088. Where should I keep my medicine? Keep out of the reach of children. Store at room temperature between 15 and 30 degrees C (59 and 86 degrees F). Do not freeze. Throw away any unused medicine after the expiration date. Once the product is opened, most experts recommend discarding the product after 30 days. NOTE: This sheet is a summary. It may not cover all possible information. If you have questions about this medicine, talk to your doctor, pharmacist, or health care provider.  2015, Elsevier/Gold Standard. (2007-12-30 14:24:03)

## 2015-01-02 NOTE — Progress Notes (Signed)
Subjective:    Patient ID: Rodney Farley, male    DOB: Dec 08, 1931, 81 y.o.   MRN: [id number]  HPI This is a very pleasant 81 yo male who is accompanied by his wife. The patient presents today for follow up CAD, DM2, depression.  He reports feeling a little better in the last month since starting carvedilol.  Blood sugars are running 170-240s. Feels more agitated when his blood sugar is elevated. He takes Lantus 60 units qpm.  He does not sleep well. He has difficulty falling asleep and staying asleep. He has some neuropathy in his feet which awakens him at night. His wife works nights and he has gotten on a nocturnal schedule.   Has used sl NTGx 4 since discharged from hospital in last 2 months. He had chest pain with radiation into left arm x 2. Relieved with two nitroglycerin each episode.   PHQ-9 score 19. He finds it difficult to find pleasure in things and has little to look forward to. He has some intermittent anxiety related to his chronic illnesses. He is interested in talking to someone regarding his depression/anxiety.   Past Medical History  Diagnosis Date  . COPD (chronic obstructive pulmonary disease)     Severe  . Hypercholesterolemia   . Depression   . Renal insufficiency   . Coronary artery disease   . Hypertension   . Atrioventricular block, complete   . Pacemaker     medtronic  . Obesity   . Sleep apnea   . Diabetes mellitus   . Old MI (myocardial infarction)   . Dyspnea on exertion 09/13/2014  . Anginal pain   . TIA (transient ischemic attack)     HISTORY OF   . Neuropathy     IN LOWER EXTREMITIES  . GERD (gastroesophageal reflux disease)    Past Surgical History  Procedure Laterality Date  . Pacemaker insertion      Complete heart block status post DDD with good function  . Tonsillectomy    . Insert / replace / remove pacemaker    . Cholecystectomy    . Tonsillectomy     Family History  Problem Relation Age of Onset  . Stroke Mother   .  Leukemia Father   . Stroke Sister   . Heart attack Brother    History  Substance Use Topics  . Smoking status: Former Smoker    Quit date: 07/18/2007  . Smokeless tobacco: Never Used  . Alcohol Use: Yes     Comment: Occa.    Review of Systems Swelling in feet for about 2 months, chest pain x 2, occasional wheezing at night, SOB with exertion.     Objective:   Physical Exam  Constitutional: He is oriented to person, place, and time. He appears well-developed and well-nourished. No distress.  Obese.  HENT:  Head: Normocephalic and atraumatic.  Right Ear: External ear normal.  Left Ear: External ear normal.  Mouth/Throat: Oropharynx is clear and moist.  Eyes: Conjunctivae are normal.  Neck: Normal range of motion. Neck supple.  Cardiovascular: Normal rate, regular rhythm, normal heart sounds and intact distal pulses.  Exam reveals no gallop and no friction rub.   No murmur heard. Pulmonary/Chest: Effort normal and breath sounds normal.  Musculoskeletal: Normal range of motion. Edema: 1+ pretibial edema.  Lymphadenopathy:    He has no cervical adenopathy.  Neurological: He is alert and oriented to person, place, and time.  Skin: Skin is warm and dry. He is not  diaphoretic.  Psychiatric: He has a normal mood and affect. His behavior is normal. Judgment and thought content normal.  Vitals reviewed.  BP 108/63 mmHg  Pulse 68  Temp(Src) 98.5 F (36.9 C) (Oral)  Resp 16  Ht 5\' 7"  (1.702 m)  Wt 225 lb 12.8 oz (102.422 kg)  BMI 35.36 kg/m2  SpO2 95%     Assessment & Plan:  Patient interviewed and examined by Dr. [name] 1. Depression - will try increased dose of zoloft and encouraged patient to seek counseling. Provided two resources for patient. - sertraline (ZOLOFT) 100 MG tablet; Take 1.5 tablets (150 mg total) by mouth daily.  Dispense: 135 tablet; Refill: 1 - Senior resources for activities?  2. Diabetes type 2, uncontrolled - Hemoglobin A1c - Will base treatment  on results  3. Renal insufficiency - Basic metabolic panel - continued follow up with renal  4. Dyspnea on exertion - Appears stable - Basic Metabolic Panel  5. Other important problems include hyperlipidemia/obstructive sleep apnea/coronary artery disease with pacer/COPD   - follow up in 3 months  [name], FNP-BC  [facility] [place] [facility]  01/02/2015 9:51 PM I have participated in the care of this patient with the Advanced Practice Provider and agree with Diagnosis and Plan as documented. Robert P. [name], M.D.  adden 6/24 labs Results for orders placed or performed in visit on [id number]  Basic metabolic panel  Result Value Ref Range   Sodium 136 135 - 145 mEq/L   Potassium 4.7 3.5 - 5.3 mEq/L   Chloride 101 96 - 112 mEq/L   CO2 25 19 - 32 mEq/L   Glucose, Bld 247 (H) 70 - 99 mg/dL   BUN 25 (H) 6 - 23 mg/dL   Creat 1.79 (H) 0.50 - 1.35 mg/dL   Calcium 9.1 8.4 - 10.5 mg/dL  Hemoglobin A1c  Result Value Ref Range   Hgb A1c MFr Bld 7.5 (H) <5.7 %   Mean Plasma Glucose 169 (H) <117 mg/dL

## 2015-01-09 ENCOUNTER — Encounter: Payer: Self-pay | Admitting: Internal Medicine

## 2015-01-29 ENCOUNTER — Other Ambulatory Visit: Payer: Self-pay | Admitting: Physician Assistant

## 2015-02-05 ENCOUNTER — Ambulatory Visit (INDEPENDENT_AMBULATORY_CARE_PROVIDER_SITE_OTHER): Payer: 59 | Admitting: *Deleted

## 2015-02-05 DIAGNOSIS — Z950 Presence of cardiac pacemaker: Secondary | ICD-10-CM

## 2015-02-05 DIAGNOSIS — I442 Atrioventricular block, complete: Secondary | ICD-10-CM | POA: Diagnosis not present

## 2015-02-06 ENCOUNTER — Other Ambulatory Visit: Payer: Self-pay

## 2015-02-06 NOTE — Telephone Encounter (Signed)
Debbie, you saw pt last month for a check up but don't see this med recently discussed. Can we RF?

## 2015-02-08 MED ORDER — OMEPRAZOLE 40 MG PO CPDR: 40.0000 mg | DELAYED_RELEASE_CAPSULE | Freq: Every day | ORAL | Status: DC

## 2015-02-09 ENCOUNTER — Encounter: Payer: Self-pay | Admitting: Internal Medicine

## 2015-02-19 LAB — CUP PACEART REMOTE DEVICE CHECK
Battery Voltage: 2.79 V
Brady Statistic AP VP Percent: 70 %
Brady Statistic AS VS Percent: 0 %
Lead Channel Pacing Threshold Amplitude: 0.625 V
Lead Channel Pacing Threshold Amplitude: 1.125 V
Lead Channel Pacing Threshold Pulse Width: 0.4 ms
Lead Channel Sensing Intrinsic Amplitude: 2.8 mV
Lead Channel Setting Pacing Amplitude: 2.25 V
Lead Channel Setting Pacing Amplitude: 2.5 V
Lead Channel Setting Pacing Pulse Width: 0.4 ms
Lead Channel Setting Sensing Sensitivity: 2.8 mV
MDC IDC MSMT BATTERY IMPEDANCE: 545 Ohm
MDC IDC MSMT BATTERY REMAINING LONGEVITY: 74 mo
MDC IDC MSMT LEADCHNL RA IMPEDANCE VALUE: 444 Ohm
MDC IDC MSMT LEADCHNL RV IMPEDANCE VALUE: 564 Ohm
MDC IDC MSMT LEADCHNL RV PACING THRESHOLD PULSEWIDTH: 0.4 ms
MDC IDC SESS DTM: 20160726125447
MDC IDC STAT BRADY AP VS PERCENT: 0 %
MDC IDC STAT BRADY AS VP PERCENT: 30 %

## 2015-02-27 ENCOUNTER — Ambulatory Visit (INDEPENDENT_AMBULATORY_CARE_PROVIDER_SITE_OTHER): Payer: 59 | Admitting: Emergency Medicine

## 2015-02-27 VITALS — BP 132/68 | HR 71 | Temp 97.1°F | Resp 18 | Ht 68.0 in | Wt 235.0 lb

## 2015-02-27 DIAGNOSIS — E1142 Type 2 diabetes mellitus with diabetic polyneuropathy: Secondary | ICD-10-CM

## 2015-02-27 DIAGNOSIS — R609 Edema, unspecified: Secondary | ICD-10-CM | POA: Diagnosis not present

## 2015-02-27 LAB — POCT URINALYSIS DIPSTICK
Bilirubin, UA: NEGATIVE
GLUCOSE UA: NEGATIVE
KETONES UA: NEGATIVE
Leukocytes, UA: NEGATIVE
Nitrite, UA: NEGATIVE
Spec Grav, UA: 1.025
Urobilinogen, UA: 0.2
pH, UA: 5

## 2015-02-27 LAB — COMPREHENSIVE METABOLIC PANEL
ALK PHOS: 60 U/L (ref 40–115)
ALT: 17 U/L (ref 9–46)
AST: 15 U/L (ref 10–35)
Albumin: 3.7 g/dL (ref 3.6–5.1)
BILIRUBIN TOTAL: 0.4 mg/dL (ref 0.2–1.2)
BUN: 36 mg/dL — AB (ref 7–25)
CO2: 27 mmol/L (ref 20–31)
CREATININE: 1.82 mg/dL — AB (ref 0.70–1.11)
Calcium: 9.6 mg/dL (ref 8.6–10.3)
Chloride: 104 mmol/L (ref 98–110)
GLUCOSE: 172 mg/dL — AB (ref 65–99)
Potassium: 4.9 mmol/L (ref 3.5–5.3)
SODIUM: 140 mmol/L (ref 135–146)
TOTAL PROTEIN: 6.4 g/dL (ref 6.1–8.1)

## 2015-02-27 LAB — POCT UA - MICROSCOPIC ONLY
CRYSTALS, UR, HPF, POC: NEGATIVE
Casts, Ur, LPF, POC: NEGATIVE
Mucus, UA: NEGATIVE
Yeast, UA: NEGATIVE

## 2015-02-27 LAB — CBC
HCT: 39.7 % (ref 39.0–52.0)
Hemoglobin: 13.8 g/dL (ref 13.0–17.0)
MCH: 30.7 pg (ref 26.0–34.0)
MCHC: 34.8 g/dL (ref 30.0–36.0)
MCV: 88.2 fL (ref 78.0–100.0)
MPV: 10.3 fL (ref 8.6–12.4)
Platelets: 253 10*3/uL (ref 150–400)
RBC: 4.5 MIL/uL (ref 4.22–5.81)
RDW: 15 % (ref 11.5–15.5)
WBC: 9.5 10*3/uL (ref 4.0–10.5)

## 2015-02-27 LAB — GLUCOSE, POCT (MANUAL RESULT ENTRY): POC GLUCOSE: 167 mg/dL — AB (ref 70–99)

## 2015-02-27 LAB — POCT GLYCOSYLATED HEMOGLOBIN (HGB A1C): HEMOGLOBIN A1C: 7.8

## 2015-02-27 MED ORDER — FUROSEMIDE 40 MG PO TABS: 40.0000 mg | ORAL_TABLET | Freq: Every day | ORAL | Status: DC

## 2015-02-27 NOTE — Progress Notes (Signed)
Subjective:  Patient ID: Rodney Farley, male    DOB: 1932-02-13  Age: 81 y.o. MRN: [id number]  CC: Edema; Foot Pain; and red sores on both feet   HPI Rodney Farley presents   With a number of chronic medical problems.  Since his June office visit with Dr. [name] is developed marked peripheral edema of the lower extremities to the knee. He now is complaining of pain and itching of his feet because of the swelling presumably. Although he has diabetes and its and is noted in the past to have some neuropathy. He has no history of injury or overuse. He denies any chest pain tightness heaviness pressure. He is not complaining of any symptoms referable to his history of depression.  In fact he is rather jovial and animated. He denies any orthopnea or paroxysmal nocturnal dyspnea does have some nocturia. He has a history of renal insufficiency did seem to be resolving over several episodes of lab work.  History Natrell has a past medical history of COPD (chronic obstructive pulmonary disease); Hypercholesterolemia; Depression; Renal insufficiency; Coronary artery disease; Hypertension; Atrioventricular block, complete; Pacemaker; Obesity; Sleep apnea; Diabetes mellitus; Old MI (myocardial infarction); Dyspnea on exertion (09/13/2014); Anginal pain; TIA (transient ischemic attack); Neuropathy; and GERD (gastroesophageal reflux disease).   He has past surgical history that includes Pacemaker insertion; Tonsillectomy; Insert / replace / remove pacemaker; Cholecystectomy; and Tonsillectomy.   His  family history includes Heart attack in his brother; Leukemia in his father; Stroke in his mother and sister.  He   reports that he quit smoking about 7 years ago. He has never used smokeless tobacco. He reports that he drinks alcohol. He reports that he does not use illicit drugs.  Outpatient Prescriptions Prior to Visit  Medication Sig Dispense Refill  . aspirin EC 81 MG tablet Take 81 mg by mouth  daily.    [name] atorvastatin (LIPITOR) 40 MG tablet TAKE 1 TABLET BY MOUTH ONCE DAILY 90 tablet 0  . carvedilol (COREG) 12.5 MG tablet Take 1 tablet (12.5 mg total) by mouth 2 (two) times daily with a meal. 60 tablet 6  . Insulin Glargine (LANTUS SOLOSTAR) 100 UNIT/ML Solostar Pen Inject 60 Units into the skin daily at 10 pm. 5 pen PRN  . Insulin Pen Needle (B-D UF III MINI PEN NEEDLES) 31G X 5 MM MISC Use to test blood sugar 3 times daily. Dx code: 250.02 100 each 11  . nitroGLYCERIN (NITROSTAT) 0.4 MG SL tablet Place 1 tablet (0.4 mg total) under the tongue every 5 (five) minutes x 3 doses as needed for chest pain. 25 tablet 3  . omeprazole (PRILOSEC) 40 MG capsule Take 1 capsule (40 mg total) by mouth daily. 90 capsule 1  . sertraline (ZOLOFT) 100 MG tablet Take 1.5 tablets (150 mg total) by mouth daily. 135 tablet 1  . tamsulosin (FLOMAX) 0.4 MG CAPS capsule TAKE 1 CAPSULE BY MOUTH DAILY. 30 capsule PRN  . terazosin (HYTRIN) 1 MG capsule TAKE 1 CAPSULE BY MOUTH DAILY AT BEDTIME 90 capsule 1  . vitamin B-12 (CYANOCOBALAMIN) 1000 MCG tablet Take 1,000 mcg by mouth daily.    . ranitidine (ZANTAC) 300 MG tablet TAKE 0.5 TABLETS (150 MG TOTAL) BY MOUTH 2 (TWO) TIMES DAILY. (Patient not taking: Reported on 02/27/2015) 90 tablet 2  . tetrahydrozoline-zinc (VISINE-AC) 0.05-0.25 % ophthalmic solution Place 2 drops into both eyes as needed (dry eyes).     No facility-administered medications prior to visit.    Social History  Social History  . Marital Status: Married    Spouse Name: N/A  . Number of Children: 1  . Years of Education: N/A   Social History Main Topics  . Smoking status: Former Smoker    Quit date: 07/18/2007  . Smokeless tobacco: Never Used  . Alcohol Use: Yes     Comment: Occa.  . Drug Use: No  . Sexual Activity: Not Asked   Other Topics Concern  . None   Social History Narrative     Review of Systems  Constitutional: Negative for fever, chills and appetite change.    HENT: Negative for congestion, ear pain, postnasal drip, sinus pressure and sore throat.   Eyes: Negative for pain and redness.  Respiratory: Positive for shortness of breath. Negative for cough and wheezing.   Cardiovascular: Positive for leg swelling.  Gastrointestinal: Negative for nausea, vomiting, abdominal pain, diarrhea, constipation and blood in stool.  Endocrine: Negative for polyuria.  Genitourinary: Negative for dysuria, urgency, frequency and flank pain.  Musculoskeletal: Negative for gait problem.  Skin: Negative for rash.  Neurological: Positive for numbness. Negative for weakness and headaches.  Psychiatric/Behavioral: Negative for confusion and decreased concentration. The patient is not nervous/anxious.     Objective:  BP 132/68 mmHg  Pulse 71  Temp(Src) 97.1 F (36.2 C) (Oral)  Resp 18  Ht 5\' 8"  (1.727 m)  Wt 235 lb (106.595 kg)  BMI 35.74 kg/m2  SpO2 97%  Physical Exam  Constitutional: He is oriented to person, place, and time. He appears well-developed and well-nourished. No distress.  HENT:  Head: Normocephalic and atraumatic.  Right Ear: External ear normal.  Left Ear: External ear normal.  Nose: Nose normal.  Eyes: Conjunctivae and EOM are normal. Pupils are equal, round, and reactive to light. No scleral icterus.  Neck: Normal range of motion. Neck supple. No tracheal deviation present.  Cardiovascular: Normal rate, regular rhythm and normal heart sounds.    +3 edema to the knee. Bilaterally  Pulmonary/Chest: Effort normal. No respiratory distress. He has no wheezes. He has no rales.  Abdominal: He exhibits no mass. There is no tenderness. There is no rebound and no guarding.  Musculoskeletal: He exhibits no edema.       Right shoulder: He exhibits swelling.  Lymphadenopathy:    He has no cervical adenopathy.  Neurological: He is alert and oriented to person, place, and time. Coordination normal.  Skin: Skin is warm and dry. No rash noted.   Psychiatric: He has a normal mood and affect. His behavior is normal.      Assessment & Plan:   Nakhi was seen today for edema, foot pain and red sores on both feet.  Diagnoses and all orders for this visit:  Edema -     Comprehensive metabolic panel -     CBC -     POCT glycosylated hemoglobin (Hb A1C) -     POCT glucose (manual entry) -     EKG 12-Lead -     Brain natriuretic peptide -     POCT UA - Microscopic Only -     POCT urinalysis dipstick  Diabetic polyneuropathy associated with type 2 diabetes mellitus -     Comprehensive metabolic panel -     CBC -     POCT glycosylated hemoglobin (Hb A1C) -     POCT glucose (manual entry)  Other orders -     furosemide (LASIX) 40 MG tablet; Take 1 tablet (40 mg total) by mouth daily.  I am having Mr. Encarnacion start on furosemide. I am also having him maintain his Insulin Pen Needle, ranitidine, Insulin Glargine, vitamin B-12, terazosin, aspirin EC, carvedilol, nitroGLYCERIN, atorvastatin, tetrahydrozoline-zinc, sertraline, tamsulosin, and omeprazole.  Meds ordered this encounter  Medications  . furosemide (LASIX) 40 MG tablet    Sig: Take 1 tablet (40 mg total) by mouth daily.    Dispense:  30 tablet    Refill:  3     he'll follow-up with me in one week  Appropriate red flag conditions were discussed with the patient as well as actions that should be taken.  Patient expressed his understanding.  Follow-up: Return in about 1 week (around 03/06/2015).  Roselee Culver, MD   Results for orders placed or performed in visit on 02/27/15  POCT glycosylated hemoglobin (Hb A1C)  Result Value Ref Range   Hemoglobin A1C 7.8   POCT glucose (manual entry)  Result Value Ref Range   POC Glucose 167 (A) 70 - 99 mg/dl  POCT UA - Microscopic Only  Result Value Ref Range   WBC, Ur, HPF, POC 0-2    RBC, urine, microscopic 0-1    Bacteria, U Microscopic trace    Mucus, UA neg    Epithelial cells, urine per micros 0-1     Crystals, Ur, HPF, POC neg    Casts, Ur, LPF, POC neg    Yeast, UA neg   POCT urinalysis dipstick  Result Value Ref Range   Color, UA yellow    Clarity, UA clear    Glucose, UA neg    Bilirubin, UA neg    Ketones, UA neg    Spec Grav, UA 1.025    Blood, UA trace-lysed    pH, UA 5.0    Protein, UA >=300    Urobilinogen, UA 0.2    Nitrite, UA neg    Leukocytes, UA Negative Negative

## 2015-02-27 NOTE — Patient Instructions (Signed)

## 2015-02-28 LAB — BRAIN NATRIURETIC PEPTIDE: Brain Natriuretic Peptide: 82.5 pg/mL (ref 0.0–100.0)

## 2015-03-04 ENCOUNTER — Encounter: Payer: Self-pay | Admitting: Cardiology

## 2015-03-04 ENCOUNTER — Ambulatory Visit (INDEPENDENT_AMBULATORY_CARE_PROVIDER_SITE_OTHER): Payer: 59 | Admitting: Cardiology

## 2015-03-04 VITALS — BP 150/80 | HR 72 | Ht 67.0 in | Wt 233.0 lb

## 2015-03-04 DIAGNOSIS — N186 End stage renal disease: Secondary | ICD-10-CM | POA: Insufficient documentation

## 2015-03-04 DIAGNOSIS — I442 Atrioventricular block, complete: Secondary | ICD-10-CM

## 2015-03-04 DIAGNOSIS — I10 Essential (primary) hypertension: Secondary | ICD-10-CM

## 2015-03-04 DIAGNOSIS — N184 Chronic kidney disease, stage 4 (severe): Secondary | ICD-10-CM

## 2015-03-04 DIAGNOSIS — I25119 Atherosclerotic heart disease of native coronary artery with unspecified angina pectoris: Secondary | ICD-10-CM | POA: Diagnosis not present

## 2015-03-04 NOTE — Patient Instructions (Signed)
Continue your current therapy   I will see you in 3 months. 

## 2015-03-04 NOTE — Progress Notes (Signed)
Rodney Farley Date of Birth: [birth year] Medical Record [id number]  History of Present Illness: Rodney Farley is seen for follow up of dyspnea. He is a pleasant 81 yo WM with extensive cardiac history. He is s/p MI x 2 in 1994 and 1995 while in [place]. He may have had angioplasty at that time but really cannot recall. He had an Adenosine Myoview in 2010 that showed an anteroseptal and apical scar without ischemia. EF 49%. Echo at that time showed septal and apical akinesis with EF 40-45%. He had complete heart block in 2010 and had a pacemaker placed at that time.  More recently he presented this spring with symptoms of worsening dyspnea. He has to stop several times walking up his driveway. He feels his chest pounding and has some chest discomfort but no true pain. These symptoms have progressed over the past year and are very limiting for him. No cough, PND, orthopnea, or edema. Sometimes running a humidifier with menthol helps. No wheezing. He does have a history of poorly controlled DM, CKD stage 4, HTN, and hyperlipidemia. He has a remote history of tobacco use and has a history of COPD and moderate sleep apnea.  Evaluation with Echo noted below. EF down to 35-40%. PFTs looked remarkably good. We initially planned to do a right and left heart cath and he was admitted for overnight hydration. His creatinine however increased to 2.44 and his procedure was cancelled. He was evaluated by renal. Creatinine has since returned to baseline of 1.8. Seen last week by Urgent care and complained of swelling in his feet. Lasix 40 mg daily added with improvement.     Medication List       This list is accurate as of: 03/04/15 11:58 AM.  Always use your most recent med list.               aspirin EC 81 MG tablet  Take 81 mg by mouth daily.     atorvastatin 40 MG tablet  Commonly known as:  LIPITOR  TAKE 1 TABLET BY MOUTH ONCE DAILY     carvedilol 12.5 MG tablet  Commonly known as:  COREG    Take 1 tablet (12.5 mg total) by mouth 2 (two) times daily with a meal.     furosemide 40 MG tablet  Commonly known as:  LASIX  Take 1 tablet (40 mg total) by mouth daily.     Insulin Glargine 100 UNIT/ML Solostar Pen  Commonly known as:  LANTUS SOLOSTAR  Inject 60 Units into the skin daily at 10 pm.     Insulin Pen Needle 31G X 5 MM Misc  Commonly known as:  B-D UF III MINI PEN NEEDLES  Use to test blood sugar 3 times daily. Dx code: 250.02     nitroGLYCERIN 0.4 MG SL tablet  Commonly known as:  NITROSTAT  Place 1 tablet (0.4 mg total) under the tongue every 5 (five) minutes x 3 doses as needed for chest pain.     omeprazole 40 MG capsule  Commonly known as:  PRILOSEC  Take 1 capsule (40 mg total) by mouth daily.     ranitidine 300 MG tablet  Commonly known as:  ZANTAC  TAKE 0.5 TABLETS (150 MG TOTAL) BY MOUTH 2 (TWO) TIMES DAILY.     sertraline 100 MG tablet  Commonly known as:  ZOLOFT  Take 1.5 tablets (150 mg total) by mouth daily.     tamsulosin 0.4 MG Caps capsule  Commonly known  as:  FLOMAX  TAKE 1 CAPSULE BY MOUTH DAILY.     terazosin 1 MG capsule  Commonly known as:  HYTRIN  TAKE 1 CAPSULE BY MOUTH DAILY AT BEDTIME     tetrahydrozoline-zinc 0.05-0.25 % ophthalmic solution  Commonly known as:  VISINE-AC  Place 2 drops into both eyes as needed (dry eyes).     vitamin B-12 1000 MCG tablet  Commonly known as:  CYANOCOBALAMIN  Take 1,000 mcg by mouth daily.        Allergies  Allergen Reactions  . Bee Venom Anaphylaxis  . Zocor [Simvastatin] Nausea Only and Other (See Comments)    Headache with brand name only.  Can take the generic.    Past Medical History  Diagnosis Date  . COPD (chronic obstructive pulmonary disease)     Severe  . Hypercholesterolemia   . Depression   . Renal insufficiency   . Coronary artery disease   . Hypertension   . Atrioventricular block, complete   . Pacemaker     medtronic  . Obesity   . Sleep apnea   . Diabetes  mellitus   . Old MI (myocardial infarction)   . Dyspnea on exertion 09/13/2014  . Anginal pain   . TIA (transient ischemic attack)     HISTORY OF   . Neuropathy     IN LOWER EXTREMITIES  . GERD (gastroesophageal reflux disease)     Past Surgical History  Procedure Laterality Date  . Pacemaker insertion      Complete heart block status post DDD with good function  . Tonsillectomy    . Insert / replace / remove pacemaker    . Cholecystectomy    . Tonsillectomy      Social History   Social History  . Marital Status: Married    Spouse Name: N/A  . Number of Children: 1  . Years of Education: N/A   Social History Main Topics  . Smoking status: Former Smoker    Quit date: 07/18/2007  . Smokeless tobacco: Never Used  . Alcohol Use: Yes     Comment: Occa.  . Drug Use: No  . Sexual Activity: Not Asked   Other Topics Concern  . None   Social History Narrative    Family History  Problem Relation Age of Onset  . Stroke Mother   . Leukemia Father   . Stroke Sister   . Heart attack Brother     Review of Systems: The review of systems is positive for peripheral neuropathy related to DM.  All other systems were reviewed and are negative.  Physical Exam: BP 150/80 mmHg  Pulse 72  Ht 5\' 7"  (1.702 m)  Wt 105.688 kg (233 lb)  BMI 36.48 kg/m2 Filed Weights   03/04/15 1127  Weight: 105.688 kg (233 lb)  GENERAL:  Well appearing, obese WM in NAD.  HEENT:  PERRL, EOMI, sclera are clear. Oropharynx is clear. NECK:  No jugular venous distention, carotid upstroke brisk and symmetric, no bruits, no thyromegaly or adenopathy LUNGS:  Clear to auscultation bilaterally CHEST:  Unremarkable HEART:  RRR,  PMI not displaced or sustained,S1 and S2 within normal limits, no S3, no S4: no clicks, no rubs, no murmurs ABD:  Soft, nontender. BS +, no masses or bruits. No hepatomegaly, no splenomegaly. Old midline surgical incision.  EXT:  2 + pulses throughout, 1+ edema, no cyanosis no  clubbing SKIN:  Warm and dry.  No rashes NEURO:  Alert and oriented x 3. Cranial nerves  II through XII intact. PSYCH:  Cognitively intact    LABORATORY DATA: Transthoracic Echocardiography  Patient:  Rodney Farley, Rodney Farley MR #:    [id number] Study Date: 09/20/2014 Gender:   M Age:    59 Height:   170.2 cm Weight:   104.3 kg BSA:    2.26 m^2 Pt. Status: Room:  ATTENDING  Doyle [name], M.D. ORDERING   Jo [name], M.D. REFERRING  Daschel [name], M.D. REFERRING  [name] P SONOGRAPHER Marygrace Drought, RCS PERFORMING  [facility], Outpatient  cc:  ------------------------------------------------------------------- LV EF: 35% -  40%  ------------------------------------------------------------------- Indications:   414.01 Coronary atherosclerosis - native artery.  ------------------------------------------------------------------- History:  PMH: Renal Insufficiency, Sleep Apnea, Coronary artery disease. Risk factors: Hypertension. Diabetes mellitus.  ------------------------------------------------------------------- Study Conclusions  - Left ventricle: The cavity size was mildly dilated. Wall thickness was normal. Systolic function was moderately reduced. The estimated ejection fraction was in the range of 35% to 40%. Mid anteroseptal, apical septal and true apex akinesis. Mid inferoseptal and apical lateral hypokinesis. Doppler parameters are consistent with abnormal left ventricular relaxation (grade 1 diastolic dysfunction). - Aortic valve: There was no stenosis. - Mitral valve: Mildly calcified annulus. There was mild regurgitation. - Left atrium: The atrium was mildly dilated. - Right ventricle: The cavity size was normal. Pacer wire or catheter noted in right ventricle. Systolic function was normal. - Tricuspid valve: Peak RV-RA gradient (S): 21 mm Hg. - Pulmonary arteries: PA peak pressure: 24 mm Hg  (S). - Inferior vena cava: The vessel was normal in size. The respirophasic diameter changes were in the normal range (= 50%), consistent with normal central venous pressure.  Impressions:  - Mildly dilated LV with moderate systolic dysfunction, EF [id number]. Wall motion abnormalities as noted above. Normal RV size and systolic function. Mild MR.  Transthoracic echocardiography. M-mode, complete 2D, spectral Doppler, and color Doppler. Birthdate: Patient birthdate: 12/09/1931. Age: Patient is 81 yr old. Sex: Gender: male. BMI: 36 kg/m^2. Blood pressure:   141/76 Patient status: Outpatient. Study date: Study date: 09/20/2014. Study time: 11:14 AM. Location: Echo laboratory.  -------------------------------------------------------------------  ------------------------------------------------------------------- Left ventricle: The cavity size was mildly dilated. Wall thickness was normal. Systolic function was moderately reduced. The estimated ejection fraction was in the range of 35% to 40%. Mid anteroseptal, apical septal and true apex akinesis. Mid inferoseptal and apical lateral hypokinesis. Doppler parameters are consistent with abnormal left ventricular relaxation (grade 1 diastolic dysfunction).  ------------------------------------------------------------------- Aortic valve:  Trileaflet; mildly calcified leaflets. Doppler: There was no stenosis.  There was no regurgitation.  ------------------------------------------------------------------- Aorta: Aortic root: The aortic root was normal in size. Ascending aorta: The ascending aorta was normal in size.  ------------------------------------------------------------------- Mitral valve:  Mildly calcified annulus. Doppler:  There was no evidence for stenosis.  There was mild regurgitation.  Peak gradient (D): 2 mm Hg.  ------------------------------------------------------------------- Left  atrium: The atrium was mildly dilated.  ------------------------------------------------------------------- Right ventricle: The cavity size was normal. Pacer wire or catheter noted in right ventricle. Systolic function was normal.  ------------------------------------------------------------------- Pulmonic valve:  Structurally normal valve.  Cusp separation was normal. Doppler: Transvalvular velocity was within the normal range. There was no regurgitation.  ------------------------------------------------------------------- Right atrium: The atrium was normal in size.  ------------------------------------------------------------------- Pericardium: There was no pericardial effusion.  ------------------------------------------------------------------- Systemic veins: Inferior vena cava: The vessel was normal in size. The respirophasic diameter changes were in the normal range (= 50%), consistent with normal central venous pressure. Diameter: 16 mm.  ------------------------------------------------------------------- Measurements  IVC  Value    Reference ID                     16  mm   ---------  Left ventricle               Value    Reference LV ID, ED, PLAX chordal       (L)   42.4 mm   43 - 52 LV fx shortening, PLAX chordal   (L)   16  %   >=29 LV PW thickness, ED             11.6 mm   --------- IVS/LV PW ratio, ED             0.91     <=1.3 Stroke volume, 2D              78  ml   --------- Stroke volume/bsa, 2D            34  ml/m^2 --------- LV ejection fraction, 1-p A4C        37  %   --------- LV e&', lateral               7.68 cm/s  --------- LV E/e&', lateral              9.32     --------- LV e&', medial                 5.48 cm/s  --------- LV E/e&', medial               13.07    --------- LV e&', average               6.58 cm/s  --------- LV E/e&', average              10.88    ---------  Ventricular septum             Value    Reference IVS thickness, ED              10.5 mm   ---------  LVOT                    Value    Reference LVOT ID, S                 21  mm   --------- LVOT area                  3.46 cm^2  --------- LVOT peak velocity, S            97  cm/s  --------- LVOT mean velocity, S            67.2 cm/s  --------- LVOT VTI, S                 22.6 cm   --------- LVOT peak gradient, S            4   mm Hg ---------  Aorta                    Value    Reference Aortic root ID, ED             35  mm   ---------  Left atrium                 Value    Reference LA ID, A-P, ES  42  mm   --------- LA ID/bsa, A-P               1.86 cm/m^2 <=2.2 LA volume, S                88  ml   --------- LA volume/bsa, S              38.9 ml/m^2 --------- LA volume, ES, 1-p A4C           86  ml   --------- LA volume/bsa, ES, 1-p A4C         38  ml/m^2 --------- LA volume, ES, 1-p A2C           85  ml   --------- LA volume/bsa, ES, 1-p A2C         37.6 ml/m^2 ---------  Mitral valve                Value    Reference Mitral E-wave peak velocity         71.6 cm/s  --------- Mitral A-wave peak velocity         109  cm/s  --------- Mitral deceleration time      (H)   282  ms   150 - 230 Mitral peak gradient, D           2    mm Hg --------- Mitral E/A ratio, peak           0.66     ---------  Pulmonary arteries             Value    Reference PA pressure, S, DP             24  mm Hg <=30  Tricuspid valve               Value    Reference Tricuspid regurg peak velocity       227  cm/s  --------- Tricuspid peak RV-RA gradient        21  mm Hg --------- Tricuspid maximal regurg velocity,     227  cm/s  --------- PISA  Systemic veins               Value    Reference Estimated CVP                3   mm Hg ---------  Right ventricle               Value    Reference RV s&', lateral, S              13.9 cm/s  ---------  Legend: (L) and (H) mark values outside specified reference range.  ------------------------------------------------------------------- Prepared and Electronically Authenticated by  Loralie Champagne, M.D. 2016-03-10T17:46:32  Lab Results  Component Value Date   WBC 9.5 02/27/2015   HGB 13.8 02/27/2015   HCT 39.7 02/27/2015   PLT 253 02/27/2015   GLUCOSE 172* 02/27/2015   CHOL 215* 09/19/2014   TRIG 247* 09/19/2014   HDL 41 09/19/2014   LDLCALC 125* 09/19/2014   ALT 17 02/27/2015   AST 15 02/27/2015   NA 140 02/27/2015   K 4.9 02/27/2015   CL 104 02/27/2015   CREATININE 1.82* 02/27/2015   BUN 36* 02/27/2015   CO2 27 02/27/2015   TSH 1.202 09/19/2014   PSA 0.55 09/19/2014   INR 0.94 10/16/2014   HGBA1C 7.8 02/27/2015   MICROALBUR 68.3* 09/19/2014    Assessment /  Plan: 1. Dyspnea on exertion. This may be related to  coronary ischemia.  EF is now moderate to severely reduced. He does have a history of moderate sleep apnea. PFTs looked good.  I still feel that right and left heart cath is indicated to evaluate whether this is more ischemia or CHF related. With CKD he would need overnight hydration and  holding ARB and diuretics for cath. If he needs PCI this may need to be staged depending on complexity.  At this point he has some pressing family issues to attend to. His older sister needs his assistance in [place]. He is planning on travelling there soon. I think his situation is stable and we can await his cardiac evaluation until the family issues have been dealt with. I will plan on follow up in 3 months.  2. Complete heart block. S/p DDD pacer. Depending on Cardiac cath results may consider upgrade to CRT.   3. CAD with remote MI x 2.   4. Ischemic cardiomyopathy with history of mild LV dysfunction  5. DM with peripheral neuropathy.  6. CKD stage 3  7. HTN   8. Hyperlpidemia.

## 2015-03-07 ENCOUNTER — Ambulatory Visit (INDEPENDENT_AMBULATORY_CARE_PROVIDER_SITE_OTHER): Payer: 59 | Admitting: Emergency Medicine

## 2015-03-07 VITALS — BP 116/62 | HR 80 | Temp 98.6°F | Resp 18 | Ht 65.75 in | Wt 233.6 lb

## 2015-03-07 DIAGNOSIS — E1142 Type 2 diabetes mellitus with diabetic polyneuropathy: Secondary | ICD-10-CM | POA: Diagnosis not present

## 2015-03-07 DIAGNOSIS — R609 Edema, unspecified: Secondary | ICD-10-CM

## 2015-03-07 NOTE — Progress Notes (Signed)
Subjective:  Patient ID: Rodney Farley, male    DOB: [birth year]  Age: 81 y.o. MRN: [id number]  CC: Follow-up   HPI Rodney Farley presents  for repeat evaluation of his leg swelling. He was seen last on 02/27/2015 with peripheral edema. He had 3+ pitting edema to knees. He was placed on Lasix 40 and has improved but still rather swollen. He has exertional shortness of breath and is unable to walk distances even on level without stopping for breath. This interferes with his ability to go shopping.[name] Has no chest pain tightness heaviness or pressure and is followed by his cardiologist Dr. [name]  History Rodney Farley has a past medical history of COPD (chronic obstructive pulmonary disease); Hypercholesterolemia; Depression; Renal insufficiency; Coronary artery disease; Hypertension; Atrioventricular block, complete; Pacemaker; Obesity; Sleep apnea; Diabetes mellitus; Old MI (myocardial infarction); Dyspnea on exertion (09/13/2014); Anginal pain; TIA (transient ischemic attack); Neuropathy; and GERD (gastroesophageal reflux disease).   He has past surgical history that includes Pacemaker insertion; Tonsillectomy; Insert / replace / remove pacemaker; Cholecystectomy; and Tonsillectomy.   His  family history includes Heart attack in his brother; Leukemia in his father; Stroke in his mother and sister.  He   reports that he quit smoking about 7 years ago. He has never used smokeless tobacco. He reports that he drinks alcohol. He reports that he does not use illicit drugs.  Outpatient Prescriptions Prior to Visit  Medication Sig Dispense Refill  . aspirin EC 81 MG tablet Take 81 mg by mouth daily.    [name] atorvastatin (LIPITOR) 40 MG tablet TAKE 1 TABLET BY MOUTH ONCE DAILY 90 tablet 0  . carvedilol (COREG) 12.5 MG tablet Take 1 tablet (12.5 mg total) by mouth 2 (two) times daily with a meal. 60 tablet 6  . furosemide (LASIX) 40 MG tablet Take 1 tablet (40 mg total) by mouth daily. 30 tablet 3  .  Insulin Glargine (LANTUS SOLOSTAR) 100 UNIT/ML Solostar Pen Inject 60 Units into the skin daily at 10 pm. 5 pen PRN  . Insulin Pen Needle (B-D UF III MINI PEN NEEDLES) 31G X 5 MM MISC Use to test blood sugar 3 times daily. Dx code: 250.02 100 each 11  . nitroGLYCERIN (NITROSTAT) 0.4 MG SL tablet Place 1 tablet (0.4 mg total) under the tongue every 5 (five) minutes x 3 doses as needed for chest pain. 25 tablet 3  . omeprazole (PRILOSEC) 40 MG capsule Take 1 capsule (40 mg total) by mouth daily. 90 capsule 1  . ranitidine (ZANTAC) 300 MG tablet TAKE 0.5 TABLETS (150 MG TOTAL) BY MOUTH 2 (TWO) TIMES DAILY. 90 tablet 2  . sertraline (ZOLOFT) 100 MG tablet Take 1.5 tablets (150 mg total) by mouth daily. 135 tablet 1  . tamsulosin (FLOMAX) 0.4 MG CAPS capsule TAKE 1 CAPSULE BY MOUTH DAILY. 30 capsule PRN  . terazosin (HYTRIN) 1 MG capsule TAKE 1 CAPSULE BY MOUTH DAILY AT BEDTIME 90 capsule 1  . tetrahydrozoline-zinc (VISINE-AC) 0.05-0.25 % ophthalmic solution Place 2 drops into both eyes as needed (dry eyes).    . vitamin B-12 (CYANOCOBALAMIN) 1000 MCG tablet Take 1,000 mcg by mouth daily.     No facility-administered medications prior to visit.    Social History   Social History  . Marital Status: Married    Spouse Name: N/A  . Number of Children: 1  . Years of Education: N/A   Social History Main Topics  . Smoking status: Former Smoker    Quit date: 07/18/2007  .  Smokeless tobacco: Never Used  . Alcohol Use: Yes     Comment: Occa.  . Drug Use: No  . Sexual Activity: Not Asked   Other Topics Concern  . None   Social History Narrative     Review of Systems  Constitutional: Negative for fever, chills and appetite change.  HENT: Negative for congestion, ear pain, postnasal drip, sinus pressure and sore throat.   Eyes: Negative for pain and redness.  Respiratory: Positive for shortness of breath. Negative for cough and wheezing.   Cardiovascular: Positive for leg swelling.    Gastrointestinal: Negative for nausea, vomiting, abdominal pain, diarrhea, constipation and blood in stool.  Endocrine: Negative for polyuria.  Genitourinary: Negative for dysuria, urgency, frequency and flank pain.  Musculoskeletal: Negative for gait problem.  Skin: Negative for rash.  Neurological: Negative for weakness and headaches.  Psychiatric/Behavioral: Negative for confusion and decreased concentration. The patient is not nervous/anxious.     Objective:  BP 116/62 mmHg  Pulse 80  Temp(Src) 98.6 F (37 C) (Oral)  Resp 18  Ht 5' 5.75" (1.67 m)  Wt 233 lb 9.6 oz (105.96 kg)  BMI 37.99 kg/m2  SpO2 97%  Physical Exam  Constitutional: He is oriented to person, place, and time. He appears well-developed and well-nourished. No distress.  HENT:  Head: Normocephalic and atraumatic.  Right Ear: External ear normal.  Left Ear: External ear normal.  Nose: Nose normal.  Eyes: Conjunctivae and EOM are normal. Pupils are equal, round, and reactive to light. No scleral icterus.  Neck: Normal range of motion. Neck supple. No tracheal deviation present.  Cardiovascular: Normal rate, regular rhythm and normal heart sounds.   Pulmonary/Chest: Effort normal. No respiratory distress. He has no wheezes. He has no rales.  Abdominal: He exhibits no mass. There is no tenderness. There is no rebound and no guarding.  Musculoskeletal: He exhibits edema.  Lymphadenopathy:    He has no cervical adenopathy.  Neurological: He is alert and oriented to person, place, and time. Coordination normal.  Skin: Skin is warm and dry. No rash noted.  Psychiatric: He has a normal mood and affect. His behavior is normal.   He has 1+ edema to the mid calf.   Assessment & Plan:   Rodney Farley was seen today for follow-up.  Diagnoses and all orders for this visit:  Edema  Diabetic polyneuropathy associated with type 2 diabetes mellitus   I am having Rodney Farley maintain his Insulin Pen Needle, ranitidine,  Insulin Glargine, vitamin B-12, terazosin, aspirin EC, carvedilol, nitroGLYCERIN, atorvastatin, tetrahydrozoline-zinc, sertraline, tamsulosin, omeprazole, and furosemide.  No orders of the defined types were placed in this encounter.   I discussed with the patient doesn't need for weight himself daily and monitor his fluid intake his wife works to congestive heart failure clinic and seems to be on top of that. He is not cooperating. I suggested he increase his Lasix to 60 mg Monday Wednesday and Friday and follow-up in a month weight himself daily.  Appropriate red flag conditions were discussed with the patient as well as actions that should be taken.  Patient expressed his understanding.  Follow-up: Return in about 1 month (around 04/07/2015).  Roselee Culver, MD

## 2015-03-07 NOTE — Patient Instructions (Signed)
Weigh yourself daily in AM and record  Increase lasix to 1 1/2 M-W-F   Edema Edema is an abnormal buildup of fluids in your bodytissues. Edema is somewhatdependent on gravity to pull the fluid to the lowest place in your body. That makes the condition more common in the legs and thighs (lower extremities). Painless swelling of the feet and ankles is common and becomes more likely as you get older. It is also common in looser tissues, like around your eyes.  When the affected area is squeezed, the fluid may move out of that spot and leave a dent for a few moments. This dent is called pitting.  CAUSES  There are many possible causes of edema. Eating too much salt and being on your feet or sitting for a long time can cause edema in your legs and ankles. Hot weather may make edema worse. Common medical causes of edema include:  Heart failure.  Liver disease.  Kidney disease.  Weak blood vessels in your legs.  Cancer.  An injury.  Pregnancy.  Some medications.  Obesity. SYMPTOMS  Edema is usually painless.Your skin may look swollen or shiny.  DIAGNOSIS  Your health care provider may be able to diagnose edema by asking about your medical history and doing a physical exam. You may need to have tests such as X-rays, an electrocardiogram, or blood tests to check for medical conditions that may cause edema.  TREATMENT  Edema treatment depends on the cause. If you have heart, liver, or kidney disease, you need the treatment appropriate for these conditions. General treatment may include:  Elevation of the affected body part above the level of your heart.  Compression of the affected body part. Pressure from elastic bandages or support stockings squeezes the tissues and forces fluid back into the blood vessels. This keeps fluid from entering the tissues.  Restriction of fluid and salt intake.  Use of a water pill (diuretic). These medications are appropriate only for some types of  edema. They pull fluid out of your body and make you urinate more often. This gets rid of fluid and reduces swelling, but diuretics can have side effects. Only use diuretics as directed by your health care provider. HOME CARE INSTRUCTIONS   Keep the affected body part above the level of your heart when you are lying down.   Do not sit still or stand for prolonged periods.   Do not put anything directly under your knees when lying down.  Do not wear constricting clothing or garters on your upper legs.   Exercise your legs to work the fluid back into your blood vessels. This may help the swelling go down.   Wear elastic bandages or support stockings to reduce ankle swelling as directed by your health care provider.   Eat a low-salt diet to reduce fluid if your health care provider recommends it.   Only take medicines as directed by your health care provider. SEEK MEDICAL CARE IF:   Your edema is not responding to treatment.  You have heart, liver, or kidney disease and notice symptoms of edema.  You have edema in your legs that does not improve after elevating them.   You have sudden and unexplained weight gain. SEEK IMMEDIATE MEDICAL CARE IF:   You develop shortness of breath or chest pain.   You cannot breathe when you lie down.  You develop pain, redness, or warmth in the swollen areas.   You have heart, liver, or kidney disease and suddenly  get edema.  You have a fever and your symptoms suddenly get worse. MAKE SURE YOU:   Understand these instructions.  Will watch your condition.  Will get help right away if you are not doing well or get worse. Document Released: 06/29/2005 Document Revised: 11/13/2013 Document Reviewed: 04/21/2013 [facility] Patient Information 2015 [place], [place]. This information is not intended to replace advice given to you by your health care provider. Make sure you discuss any questions you have with your health care provider.

## 2015-03-14 ENCOUNTER — Encounter: Payer: Self-pay | Admitting: Cardiology

## 2015-03-20 ENCOUNTER — Encounter: Payer: Self-pay | Admitting: Cardiology

## 2015-03-25 ENCOUNTER — Telehealth: Payer: Self-pay | Admitting: Cardiology

## 2015-03-25 DIAGNOSIS — I25119 Atherosclerotic heart disease of native coronary artery with unspecified angina pectoris: Secondary | ICD-10-CM

## 2015-03-25 DIAGNOSIS — I255 Ischemic cardiomyopathy: Secondary | ICD-10-CM

## 2015-03-25 DIAGNOSIS — I200 Unstable angina: Secondary | ICD-10-CM

## 2015-03-25 NOTE — Telephone Encounter (Signed)
Returned call to patient.[facility] admitting office will call you Thursday 03/28/15 to be admitted for hydration.Rt and lf cardiac cath scheduled Fri 03/29/15 at 7:30 am.Advised to have pre cath lab work tomorrow 03/26/15.Follow up cath appointment scheduled with Truitt Merle NP 04/17/15 at 9:00 am at [facility] office.

## 2015-03-25 NOTE — Telephone Encounter (Signed)
Pt called in wanting to speak with Malachy Mood about a procedure he will be having . Please f/u with him  Thanks

## 2015-03-25 NOTE — Telephone Encounter (Signed)
Per discussion pt had questions about hospital admission date/time & preprocedure labs.

## 2015-03-26 ENCOUNTER — Encounter: Payer: Self-pay | Admitting: Internal Medicine

## 2015-03-26 LAB — PROTIME-INR
INR: 1.07 (ref <1.50)
PROTHROMBIN TIME: 14 s (ref 11.6–15.2)

## 2015-03-26 LAB — BASIC METABOLIC PANEL
BUN: 38 mg/dL — ABNORMAL HIGH (ref 7–25)
CO2: 26 mmol/L (ref 20–31)
Calcium: 9.2 mg/dL (ref 8.6–10.3)
Chloride: 102 mmol/L (ref 98–110)
Creat: 2.15 mg/dL — ABNORMAL HIGH (ref 0.70–1.11)
GLUCOSE: 121 mg/dL — AB (ref 65–99)
POTASSIUM: 4.4 mmol/L (ref 3.5–5.3)
Sodium: 141 mmol/L (ref 135–146)

## 2015-03-27 LAB — CBC WITH DIFFERENTIAL/PLATELET
BASOS ABS: 0 10*3/uL (ref 0.0–0.1)
Basophils Relative: 0 % (ref 0–1)
Eosinophils Absolute: 0.3 10*3/uL (ref 0.0–0.7)
Eosinophils Relative: 3 % (ref 0–5)
HCT: 39.4 % (ref 39.0–52.0)
HEMOGLOBIN: 13.2 g/dL (ref 13.0–17.0)
LYMPHS PCT: 29 % (ref 12–46)
Lymphs Abs: 2.8 10*3/uL (ref 0.7–4.0)
MCH: 30.1 pg (ref 26.0–34.0)
MCHC: 33.5 g/dL (ref 30.0–36.0)
MCV: 90 fL (ref 78.0–100.0)
MPV: 9.9 fL (ref 8.6–12.4)
Monocytes Absolute: 0.8 10*3/uL (ref 0.1–1.0)
Monocytes Relative: 8 % (ref 3–12)
NEUTROS ABS: 5.8 10*3/uL (ref 1.7–7.7)
Neutrophils Relative %: 60 % (ref 43–77)
Platelets: 229 10*3/uL (ref 150–400)
RBC: 4.38 MIL/uL (ref 4.22–5.81)
RDW: 14.8 % (ref 11.5–15.5)
WBC: 9.6 10*3/uL (ref 4.0–10.5)

## 2015-03-28 ENCOUNTER — Encounter (HOSPITAL_COMMUNITY): Payer: Self-pay | Admitting: Cardiology

## 2015-03-28 ENCOUNTER — Other Ambulatory Visit: Payer: Self-pay | Admitting: Internal Medicine

## 2015-03-28 ENCOUNTER — Observation Stay (HOSPITAL_COMMUNITY)
Admission: AD | Admit: 2015-03-28 | Discharge: 2015-03-29 | Disposition: A | Discharge status: Home / Self Care | Payer: 59 | Source: Ambulatory Visit | Attending: Cardiovascular Disease | Admitting: Cardiovascular Disease

## 2015-03-28 ENCOUNTER — Inpatient Hospital Stay (HOSPITAL_COMMUNITY): Payer: 59

## 2015-03-28 DIAGNOSIS — Z794 Long term (current) use of insulin: Secondary | ICD-10-CM | POA: Insufficient documentation

## 2015-03-28 DIAGNOSIS — Z950 Presence of cardiac pacemaker: Secondary | ICD-10-CM | POA: Diagnosis not present

## 2015-03-28 DIAGNOSIS — I2582 Chronic total occlusion of coronary artery: Secondary | ICD-10-CM | POA: Diagnosis not present

## 2015-03-28 DIAGNOSIS — N186 End stage renal disease: Secondary | ICD-10-CM | POA: Diagnosis present

## 2015-03-28 DIAGNOSIS — J449 Chronic obstructive pulmonary disease, unspecified: Secondary | ICD-10-CM | POA: Diagnosis not present

## 2015-03-28 DIAGNOSIS — I2584 Coronary atherosclerosis due to calcified coronary lesion: Secondary | ICD-10-CM | POA: Diagnosis not present

## 2015-03-28 DIAGNOSIS — F329 Major depressive disorder, single episode, unspecified: Secondary | ICD-10-CM | POA: Insufficient documentation

## 2015-03-28 DIAGNOSIS — Z6837 Body mass index (BMI) 37.0-37.9, adult: Secondary | ICD-10-CM | POA: Insufficient documentation

## 2015-03-28 DIAGNOSIS — E119 Type 2 diabetes mellitus without complications: Secondary | ICD-10-CM | POA: Diagnosis present

## 2015-03-28 DIAGNOSIS — K219 Gastro-esophageal reflux disease without esophagitis: Secondary | ICD-10-CM | POA: Insufficient documentation

## 2015-03-28 DIAGNOSIS — I10 Essential (primary) hypertension: Secondary | ICD-10-CM | POA: Diagnosis present

## 2015-03-28 DIAGNOSIS — E1165 Type 2 diabetes mellitus with hyperglycemia: Secondary | ICD-10-CM | POA: Diagnosis not present

## 2015-03-28 DIAGNOSIS — Z7982 Long term (current) use of aspirin: Secondary | ICD-10-CM | POA: Insufficient documentation

## 2015-03-28 DIAGNOSIS — I129 Hypertensive chronic kidney disease with stage 1 through stage 4 chronic kidney disease, or unspecified chronic kidney disease: Secondary | ICD-10-CM | POA: Insufficient documentation

## 2015-03-28 DIAGNOSIS — I272 Other secondary pulmonary hypertension: Secondary | ICD-10-CM | POA: Insufficient documentation

## 2015-03-28 DIAGNOSIS — G4730 Sleep apnea, unspecified: Secondary | ICD-10-CM | POA: Diagnosis not present

## 2015-03-28 DIAGNOSIS — R600 Localized edema: Secondary | ICD-10-CM | POA: Insufficient documentation

## 2015-03-28 DIAGNOSIS — E669 Obesity, unspecified: Secondary | ICD-10-CM | POA: Diagnosis not present

## 2015-03-28 DIAGNOSIS — N183 Chronic kidney disease, stage 3 (moderate): Secondary | ICD-10-CM

## 2015-03-28 DIAGNOSIS — E1159 Type 2 diabetes mellitus with other circulatory complications: Secondary | ICD-10-CM | POA: Diagnosis present

## 2015-03-28 DIAGNOSIS — Z79899 Other long term (current) drug therapy: Secondary | ICD-10-CM | POA: Diagnosis not present

## 2015-03-28 DIAGNOSIS — Z87891 Personal history of nicotine dependence: Secondary | ICD-10-CM | POA: Diagnosis not present

## 2015-03-28 DIAGNOSIS — I25119 Atherosclerotic heart disease of native coronary artery with unspecified angina pectoris: Secondary | ICD-10-CM | POA: Diagnosis present

## 2015-03-28 DIAGNOSIS — I442 Atrioventricular block, complete: Secondary | ICD-10-CM | POA: Diagnosis not present

## 2015-03-28 DIAGNOSIS — E1122 Type 2 diabetes mellitus with diabetic chronic kidney disease: Secondary | ICD-10-CM | POA: Diagnosis not present

## 2015-03-28 DIAGNOSIS — I5022 Chronic systolic (congestive) heart failure: Secondary | ICD-10-CM | POA: Diagnosis not present

## 2015-03-28 DIAGNOSIS — I252 Old myocardial infarction: Secondary | ICD-10-CM | POA: Diagnosis not present

## 2015-03-28 DIAGNOSIS — I25708 Atherosclerosis of coronary artery bypass graft(s), unspecified, with other forms of angina pectoris: Secondary | ICD-10-CM | POA: Diagnosis present

## 2015-03-28 DIAGNOSIS — I25110 Atherosclerotic heart disease of native coronary artery with unstable angina pectoris: Secondary | ICD-10-CM | POA: Diagnosis not present

## 2015-03-28 DIAGNOSIS — I2510 Atherosclerotic heart disease of native coronary artery without angina pectoris: Principal | ICD-10-CM | POA: Insufficient documentation

## 2015-03-28 DIAGNOSIS — E780 Pure hypercholesterolemia: Secondary | ICD-10-CM | POA: Insufficient documentation

## 2015-03-28 DIAGNOSIS — N184 Chronic kidney disease, stage 4 (severe): Secondary | ICD-10-CM | POA: Diagnosis not present

## 2015-03-28 DIAGNOSIS — R0609 Other forms of dyspnea: Secondary | ICD-10-CM

## 2015-03-28 DIAGNOSIS — Z8673 Personal history of transient ischemic attack (TIA), and cerebral infarction without residual deficits: Secondary | ICD-10-CM | POA: Diagnosis not present

## 2015-03-28 DIAGNOSIS — I255 Ischemic cardiomyopathy: Secondary | ICD-10-CM | POA: Insufficient documentation

## 2015-03-28 DIAGNOSIS — I152 Hypertension secondary to endocrine disorders: Secondary | ICD-10-CM | POA: Diagnosis present

## 2015-03-28 DIAGNOSIS — R0600 Dyspnea, unspecified: Secondary | ICD-10-CM | POA: Diagnosis present

## 2015-03-28 HISTORY — DX: Other forms of dyspnea: R06.09

## 2015-03-28 HISTORY — DX: Dyspnea, unspecified: R06.00

## 2015-03-28 LAB — BASIC METABOLIC PANEL
ANION GAP: 8 (ref 5–15)
BUN: 35 mg/dL — ABNORMAL HIGH (ref 6–20)
CHLORIDE: 101 mmol/L (ref 101–111)
CO2: 27 mmol/L (ref 22–32)
Calcium: 8.9 mg/dL (ref 8.9–10.3)
Creatinine, Ser: 2.14 mg/dL — ABNORMAL HIGH (ref 0.61–1.24)
GFR calc Af Amer: 32 mL/min — ABNORMAL LOW (ref >60)
GFR, EST NON AFRICAN AMERICAN: 27 mL/min — AB (ref >60)
Glucose, Bld: 185 mg/dL — ABNORMAL HIGH (ref 65–99)
POTASSIUM: 4.3 mmol/L (ref 3.5–5.1)
SODIUM: 136 mmol/L (ref 135–145)

## 2015-03-28 LAB — CBC
HCT: 38.4 % — ABNORMAL LOW (ref 39.0–52.0)
HEMOGLOBIN: 13.1 g/dL (ref 13.0–17.0)
MCH: 30.3 pg (ref 26.0–34.0)
MCHC: 34.1 g/dL (ref 30.0–36.0)
MCV: 88.7 fL (ref 78.0–100.0)
PLATELETS: 207 10*3/uL (ref 150–400)
RBC: 4.33 MIL/uL (ref 4.22–5.81)
RDW: 13.7 % (ref 11.5–15.5)
WBC: 8.4 10*3/uL (ref 4.0–10.5)

## 2015-03-28 LAB — GLUCOSE, CAPILLARY
GLUCOSE-CAPILLARY: 132 mg/dL — AB (ref 65–99)
Glucose-Capillary: 153 mg/dL — ABNORMAL HIGH (ref 65–99)
Glucose-Capillary: 157 mg/dL — ABNORMAL HIGH (ref 65–99)

## 2015-03-28 LAB — TROPONIN I

## 2015-03-28 LAB — HEPATIC FUNCTION PANEL
ALBUMIN: 3.5 g/dL (ref 3.5–5.0)
ALK PHOS: 59 U/L (ref 38–126)
ALT: 20 U/L (ref 17–63)
AST: 20 U/L (ref 15–41)
Bilirubin, Direct: <0.1 mg/dL — ABNORMAL LOW (ref 0.1–0.5)
TOTAL PROTEIN: 6.4 g/dL — AB (ref 6.5–8.1)
Total Bilirubin: 0.5 mg/dL (ref 0.3–1.2)

## 2015-03-28 LAB — TSH: TSH: 1.606 u[IU]/mL (ref 0.350–4.500)

## 2015-03-28 LAB — MAGNESIUM: Magnesium: 1.6 mg/dL — ABNORMAL LOW (ref 1.7–2.4)

## 2015-03-28 MED ORDER — SODIUM CHLORIDE 0.9 % WEIGHT BASED INFUSION: 3.0000 mL/kg/h | INTRAVENOUS | Status: DC

## 2015-03-28 MED ORDER — TERAZOSIN HCL 1 MG PO CAPS
1.0000 mg | ORAL_CAPSULE | Freq: Every day | ORAL | Status: DC
  Administered 2015-03-28: 1 mg via ORAL
  Filled 2015-03-28 (×3): qty 1

## 2015-03-28 MED ORDER — ONDANSETRON HCL 4 MG/2ML IJ SOLN: 4.0000 mg | Freq: Four times a day (QID) | INTRAMUSCULAR | Status: DC | PRN

## 2015-03-28 MED ORDER — SODIUM CHLORIDE 0.9 % IV SOLN: 250.0000 mL | INTRAVENOUS | Status: DC | PRN

## 2015-03-28 MED ORDER — SODIUM CHLORIDE 0.9 % IJ SOLN
3.0000 mL | Freq: Two times a day (BID) | INTRAMUSCULAR | Status: DC
  Administered 2015-03-28: 3 mL via INTRAVENOUS

## 2015-03-28 MED ORDER — SODIUM CHLORIDE 0.9 % WEIGHT BASED INFUSION: 1.0000 mL/kg/h | INTRAVENOUS | Status: DC

## 2015-03-28 MED ORDER — ACETAMINOPHEN 650 MG RE SUPP: 650.0000 mg | Freq: Four times a day (QID) | RECTAL | Status: DC | PRN

## 2015-03-28 MED ORDER — ASPIRIN EC 81 MG PO TBEC: 81.0000 mg | DELAYED_RELEASE_TABLET | Freq: Every day | ORAL | Status: DC

## 2015-03-28 MED ORDER — PANTOPRAZOLE SODIUM 40 MG PO TBEC
40.0000 mg | DELAYED_RELEASE_TABLET | Freq: Every day | ORAL | Status: DC
Start: 2015-03-28 — End: 2015-03-29
  Administered 2015-03-28 – 2015-03-29 (×2): 40 mg via ORAL
  Filled 2015-03-28 (×2): qty 1

## 2015-03-28 MED ORDER — NITROGLYCERIN 0.4 MG SL SUBL: 0.4000 mg | SUBLINGUAL_TABLET | SUBLINGUAL | Status: DC | PRN

## 2015-03-28 MED ORDER — ONDANSETRON HCL 4 MG PO TABS: 4.0000 mg | ORAL_TABLET | Freq: Four times a day (QID) | ORAL | Status: DC | PRN

## 2015-03-28 MED ORDER — INSULIN GLARGINE 100 UNITS/ML SOLOSTAR PEN
40.0000 [IU] | PEN_INJECTOR | Freq: Every day | SUBCUTANEOUS | Status: DC
  Filled 2015-03-28: qty 3

## 2015-03-28 MED ORDER — INSULIN ASPART 100 UNIT/ML ~~LOC~~ SOLN: 0.0000 [IU] | Freq: Every day | SUBCUTANEOUS | Status: DC

## 2015-03-28 MED ORDER — ATORVASTATIN CALCIUM 40 MG PO TABS
40.0000 mg | ORAL_TABLET | Freq: Every day | ORAL | Status: DC
  Administered 2015-03-28 – 2015-03-29 (×2): 40 mg via ORAL
  Filled 2015-03-28 (×2): qty 1

## 2015-03-28 MED ORDER — ALPRAZOLAM 0.25 MG PO TABS
0.2500 mg | ORAL_TABLET | Freq: Three times a day (TID) | ORAL | Status: DC | PRN
  Administered 2015-03-28: 0.25 mg via ORAL
  Filled 2015-03-28: qty 1

## 2015-03-28 MED ORDER — HEPARIN SODIUM (PORCINE) 5000 UNIT/ML IJ SOLN
5000.0000 [IU] | Freq: Three times a day (TID) | INTRAMUSCULAR | Status: DC
  Administered 2015-03-28 – 2015-03-29 (×3): 5000 [IU] via SUBCUTANEOUS
  Filled 2015-03-28 (×3): qty 1

## 2015-03-28 MED ORDER — SODIUM CHLORIDE 0.9 % IV SOLN
INTRAVENOUS | Status: AC
  Administered 2015-03-28: 16:00:00 via INTRAVENOUS

## 2015-03-28 MED ORDER — MAGNESIUM OXIDE 400 (241.3 MG) MG PO TABS
400.0000 mg | ORAL_TABLET | Freq: Every day | ORAL | Status: DC
  Administered 2015-03-28 – 2015-03-29 (×2): 400 mg via ORAL
  Filled 2015-03-28 (×2): qty 1

## 2015-03-28 MED ORDER — SENNOSIDES-DOCUSATE SODIUM 8.6-50 MG PO TABS: 1.0000 | ORAL_TABLET | Freq: Every evening | ORAL | Status: DC | PRN

## 2015-03-28 MED ORDER — ZOLPIDEM TARTRATE 5 MG PO TABS: 5.0000 mg | ORAL_TABLET | Freq: Every evening | ORAL | Status: DC | PRN

## 2015-03-28 MED ORDER — SERTRALINE HCL 50 MG PO TABS
150.0000 mg | ORAL_TABLET | Freq: Every day | ORAL | Status: DC
  Administered 2015-03-29: 150 mg via ORAL
  Filled 2015-03-28 (×2): qty 1

## 2015-03-28 MED ORDER — ASPIRIN 81 MG PO CHEW
81.0000 mg | CHEWABLE_TABLET | ORAL | Status: AC
  Administered 2015-03-29: 81 mg via ORAL
  Filled 2015-03-28: qty 1

## 2015-03-28 MED ORDER — VITAMIN B-12 1000 MCG PO TABS
1000.0000 ug | ORAL_TABLET | Freq: Every day | ORAL | Status: DC
  Administered 2015-03-29: 1000 ug via ORAL
  Filled 2015-03-28: qty 1

## 2015-03-28 MED ORDER — INSULIN ASPART 100 UNIT/ML ~~LOC~~ SOLN
0.0000 [IU] | Freq: Three times a day (TID) | SUBCUTANEOUS | Status: DC
  Administered 2015-03-28: 2 [IU] via SUBCUTANEOUS

## 2015-03-28 MED ORDER — SODIUM CHLORIDE 0.9 % IJ SOLN: 3.0000 mL | INTRAMUSCULAR | Status: DC | PRN

## 2015-03-28 MED ORDER — CARVEDILOL 12.5 MG PO TABS
12.5000 mg | ORAL_TABLET | Freq: Two times a day (BID) | ORAL | Status: DC
  Administered 2015-03-28: 12.5 mg via ORAL
  Filled 2015-03-28 (×2): qty 1

## 2015-03-28 MED ORDER — INSULIN GLARGINE 100 UNIT/ML ~~LOC~~ SOLN
40.0000 [IU] | Freq: Every day | SUBCUTANEOUS | Status: DC
  Administered 2015-03-28: 40 [IU] via SUBCUTANEOUS
  Filled 2015-03-28 (×2): qty 0.4

## 2015-03-28 MED ORDER — TAMSULOSIN HCL 0.4 MG PO CAPS
0.4000 mg | ORAL_CAPSULE | Freq: Every day | ORAL | Status: DC
  Administered 2015-03-28 – 2015-03-29 (×2): 0.4 mg via ORAL
  Filled 2015-03-28 (×2): qty 1

## 2015-03-28 MED ORDER — ACETAMINOPHEN 325 MG PO TABS: 650.0000 mg | ORAL_TABLET | Freq: Four times a day (QID) | ORAL | Status: DC | PRN

## 2015-03-28 NOTE — H&P (Addendum)
Rodney Farley is an 81 y.o. male.    Primary Cardiologist:Dr. [name]  PCP: [name], MD  Chief Complaint: pt with dyspnea, with reduced EF, plan fro Rt and Lt heart cath but needs hydration.  HPI:  81 yo WM with extensive cardiac history. He is s/p MI x 2 in 1994 and 1995 while in [place]. He may have had angioplasty at that time but he really cannot recall. He had an Adenosine Myoview in 2010 that showed an anteroseptal and apical scar without ischemia. EF 49%. Echo at that time showed septal and apical akinesis with EF 40-45%. He had complete heart block in 2010 and had a pacemaker placed at that time.   More recently he presented this spring with symptoms of worsening dyspnea. He has to stop several times walking up his driveway. He feels his chest pounding and has some chest discomfort but no true pain. These symptoms have progressed over the past year and are very limiting for him. No cough, PND, orthopnea, or edema. Sometimes running a humidifier with menthol helps. No wheezing. He does have a history of poorly controlled DM, CKD stage 4, HTN, and hyperlipidemia. He has a remote history of tobacco use and has a history of COPD and moderate sleep apnea.   Evaluation with Echo noted EF down to 35-40%. PFTs looked remarkably good. We initially planned to do a right and left heart cath and he was admitted for overnight hydration. His creatinine however increased to 2.44 and his procedure was cancelled. He was evaluated by renal. Creatinine has since returned to baseline of 1.8. Seen last month by Urgent care and complained of swelling in his feet. Lasix 40 mg daily added with improvement.  He then saw Dr. [name] and plans were made for elective Rt and Lt heart cath with admit for hydration.  Today he has continued DOE, lower ext edema and he was awakened from sleep Tuesday with chest pain that lasted about 15 min.  Could not get to the NTG to take.   Since April he  has used maybe 5 NTG.  He did not go to [place], he wanted to get heart taken care of first.     ECHO 09/20/14: Study Conclusions  - Left ventricle: The cavity size was mildly dilated. Wall thickness was normal. Systolic function was moderately reduced. The estimated ejection fraction was in the range of 35% to 40%. Mid anteroseptal, apical septal and true apex akinesis. Mid inferoseptal and apical lateral hypokinesis. Doppler parameters are consistent with abnormal left ventricular relaxation (grade 1 diastolic dysfunction). - Aortic valve: There was no stenosis. - Mitral valve: Mildly calcified annulus. There was mild regurgitation. - Left atrium: The atrium was mildly dilated. - Right ventricle: The cavity size was normal. Pacer wire or catheter noted in right ventricle. Systolic function was normal. - Tricuspid valve: Peak RV-RA gradient (S): 21 mm Hg. - Pulmonary arteries: PA peak pressure: 24 mm Hg (S). - Inferior vena cava: The vessel was normal in size. The respirophasic diameter changes were in the normal range (= 50%), consistent with normal central venous pressure.  Impressions:  - Mildly dilated LV with moderate systolic dysfunction, EF [id number]. Wall motion abnormalities as noted above. Normal RV size and systolic function. Mild MR.   Past Medical History  Diagnosis Date  . COPD (chronic obstructive pulmonary disease)     Severe  . Hypercholesterolemia   . Depression   . Renal insufficiency   .  Coronary artery disease   . Hypertension   . Atrioventricular block, complete   . Pacemaker     medtronic  . Obesity   . Sleep apnea   . Diabetes mellitus   . Old MI (myocardial infarction)   . Dyspnea on exertion 09/13/2014  . Anginal pain   . TIA (transient ischemic attack)     HISTORY OF   . Neuropathy     IN LOWER EXTREMITIES  . GERD (gastroesophageal reflux disease)   . DOE (dyspnea on exertion) 03/28/2015    Past Surgical History    Procedure Laterality Date  . Pacemaker insertion      Complete heart block status post DDD with good function  . Tonsillectomy    . Insert / replace / remove pacemaker    . Cholecystectomy    . Tonsillectomy      Family History  Problem Relation Age of Onset  . Stroke Mother   . Leukemia Father   . Stroke Sister   . Heart attack Brother    Social History:  reports that he quit smoking about 7 years ago. He has never used smokeless tobacco. He reports that he drinks alcohol. He reports that he does not use illicit drugs.  Allergies:  Allergies  Allergen Reactions  . Bee Venom Anaphylaxis  . Zocor [Simvastatin] Nausea Only and Other (See Comments)    Headache with brand name only.  Can take the generic.    Medications Prior to Admission  Medication Sig Dispense Refill  . aspirin EC 81 MG tablet Take 81 mg by mouth daily.    [name] atorvastatin (LIPITOR) 40 MG tablet TAKE 1 TABLET BY MOUTH ONCE DAILY 90 tablet 0  . carvedilol (COREG) 12.5 MG tablet Take 1 tablet (12.5 mg total) by mouth 2 (two) times daily with a meal. 60 tablet 6  . furosemide (LASIX) 40 MG tablet Take 1 tablet (40 mg total) by mouth daily. (Patient taking differently: Take 40 mg by mouth daily. Sun tues thurs, sat is 40mg . All other days 60mg ) 30 tablet 3  . Insulin Glargine (LANTUS SOLOSTAR) 100 UNIT/ML Solostar Pen Inject 60 Units into the skin daily at 10 pm. 5 pen PRN  . nitroGLYCERIN (NITROSTAT) 0.4 MG SL tablet Place 1 tablet (0.4 mg total) under the tongue every 5 (five) minutes x 3 doses as needed for chest pain. 25 tablet 3  . omeprazole (PRILOSEC) 40 MG capsule Take 1 capsule (40 mg total) by mouth daily. 90 capsule 1  . ranitidine (ZANTAC) 300 MG tablet TAKE 0.5 TABLETS (150 MG TOTAL) BY MOUTH 2 (TWO) TIMES DAILY. 90 tablet 2  . sertraline (ZOLOFT) 100 MG tablet Take 1.5 tablets (150 mg total) by mouth daily. 135 tablet 1  . tamsulosin (FLOMAX) 0.4 MG CAPS capsule TAKE 1 CAPSULE BY MOUTH DAILY. 30 capsule  PRN  . terazosin (HYTRIN) 1 MG capsule TAKE 1 CAPSULE BY MOUTH DAILY AT BEDTIME 90 capsule 1  . vitamin B-12 (CYANOCOBALAMIN) 1000 MCG tablet Take 1,000 mcg by mouth daily.    . Insulin Pen Needle (B-D UF III MINI PEN NEEDLES) 31G X 5 MM MISC Use to test blood sugar 3 times daily. Dx code: 250.02 100 each 11  . tetrahydrozoline-zinc (VISINE-AC) 0.05-0.25 % ophthalmic solution Place 2 drops into both eyes as needed (dry eyes).      Results for orders placed or performed during the hospital encounter of 03/28/15 (from the past 48 hour(s))  Glucose, capillary     Status:  Abnormal   Collection Time: 03/28/15 12:28 PM  Result Value Ref Range   Glucose-Capillary 153 (H) 65 - 99 mg/dL   No results found.  ROS: General:no colds or fevers, + increase weight changes Skin:no rashes or ulcers HEENT:no blurred vision, no congestion CV:see HPI PUL:see HPI GI:no diarrhea constipation or melena, no indigestion GU:no hematuria, no dysuria MS:no joint pain, no claudication Neuro:no syncope, occ lightheadedness when going from stooping to standing Endo:+ diabetes, no thyroid disease   Blood pressure 177/95, pulse 82, temperature 98.2 F (36.8 C), temperature source Oral, resp. rate 18, height 5\' 5"  (1.651 m), weight 232 lb 4.8 oz (105.371 kg), SpO2 99 %. PE: General:Pleasant affect, NAD Skin:Warm and dry, brisk capillary refill HEENT:normocephalic, sclera clear, mucus membranes moist Neck:supple, mild JVD, no bruits  Heart:S1S2 RRR without murmur, gallup, rub or click Lungs:clear without rales, rhonchi, or wheezes [id number], soft, non tender, + BS, do not palpate liver spleen or masses Ext:1-2+ lower ext edema Lt > Rt, 2+ pedal pulses, 2+ radial pulses Neuro:alert and oriented X 3, MAE, follows commands, + facial symmetry    Assessment/Plan Principal Problem:   DOE (dyspnea on exertion)- lungs clear possible angina with decrease in EF. Hope to cath in am. Active Problems:   CKD 3-4  Will  check stat BMP if Cr lower than Tuesday then we could proceed with cath but if climbing may need renal consult first.    Ischemic cardiomyopathy EF 35 -40   Pacemaker- pacing   Sleep apnea   Diabetes type 2, uncontrolled on inslin add SSI   HTN (hypertension)- elevated today   CAD (coronary artery disease), native coronary artery with hx PCI in [place] or so.    [place] Nurse Practitioner Certified [place] Pager [phone] or after 5pm or weekends call [phone] 03/28/2015, 1:44 PM   I have personally seen and examined this patient with Cecilie Kicks, NP. I agree with the assessment and plan as outlined above. He is being admitted today upon recommendations by Dr. [name] for hydration tonight prior to right and left heart catheterization tomorrow. He has had recent chest pain and dyspnea concerning for unstable angina. Cardiac catheterization is indicated. He has CKD stage 3. Baseline creatinine is 1.7-1.8. Creatinine 2.1 three days ago. He had a similar admission April 2016 for hydration and given elevated creatinine, Nephrology was called and cath was cancelled. He does not appear grossly volume overloaded today. He does have LE edema x 6-8 weeks, slightly improved per pt but no JVD or crackles on exam. We will check BMET today, hydrate gently for the next 18 hours and then repeat BMET in the am. If stable, will proceed with cath tomorrow. Will check LE venous dopplers given L>R LE edema to exclude DVT. Pt is agreeable with plan. Further decision in regards to planning for cath in am. NPO at midnight.   Jennfer Gassen 03/28/2015 2:35 PM

## 2015-03-29 ENCOUNTER — Other Ambulatory Visit: Payer: Self-pay | Admitting: Cardiology

## 2015-03-29 ENCOUNTER — Encounter (HOSPITAL_COMMUNITY)
Admission: AD | Disposition: A | Discharge status: Home / Self Care | Payer: Self-pay | Source: Ambulatory Visit | Attending: Cardiovascular Disease

## 2015-03-29 ENCOUNTER — Encounter (HOSPITAL_COMMUNITY): Payer: Self-pay | Admitting: Cardiology

## 2015-03-29 ENCOUNTER — Observation Stay (HOSPITAL_BASED_OUTPATIENT_CLINIC_OR_DEPARTMENT_OTHER): Payer: 59

## 2015-03-29 DIAGNOSIS — Z5181 Encounter for therapeutic drug level monitoring: Secondary | ICD-10-CM

## 2015-03-29 DIAGNOSIS — I270 Primary pulmonary hypertension: Secondary | ICD-10-CM

## 2015-03-29 DIAGNOSIS — I2510 Atherosclerotic heart disease of native coronary artery without angina pectoris: Secondary | ICD-10-CM | POA: Diagnosis not present

## 2015-03-29 DIAGNOSIS — I25118 Atherosclerotic heart disease of native coronary artery with other forms of angina pectoris: Secondary | ICD-10-CM

## 2015-03-29 DIAGNOSIS — N183 Chronic kidney disease, stage 3 (moderate): Secondary | ICD-10-CM

## 2015-03-29 DIAGNOSIS — R0609 Other forms of dyspnea: Secondary | ICD-10-CM

## 2015-03-29 DIAGNOSIS — R600 Localized edema: Secondary | ICD-10-CM | POA: Diagnosis present

## 2015-03-29 DIAGNOSIS — R609 Edema, unspecified: Secondary | ICD-10-CM

## 2015-03-29 HISTORY — PX: CARDIAC CATHETERIZATION: SHX172

## 2015-03-29 LAB — CBC
HEMATOCRIT: 38.8 % — AB (ref 39.0–52.0)
HEMOGLOBIN: 13.7 g/dL (ref 13.0–17.0)
MCH: 31.4 pg (ref 26.0–34.0)
MCHC: 35.3 g/dL (ref 30.0–36.0)
MCV: 88.8 fL (ref 78.0–100.0)
Platelets: 206 10*3/uL (ref 150–400)
RBC: 4.37 MIL/uL (ref 4.22–5.81)
RDW: 13.6 % (ref 11.5–15.5)
WBC: 7.7 10*3/uL (ref 4.0–10.5)

## 2015-03-29 LAB — BASIC METABOLIC PANEL
ANION GAP: 6 (ref 5–15)
BUN: 31 mg/dL — ABNORMAL HIGH (ref 6–20)
CO2: 28 mmol/L (ref 22–32)
Calcium: 8.9 mg/dL (ref 8.9–10.3)
Chloride: 105 mmol/L (ref 101–111)
Creatinine, Ser: 1.95 mg/dL — ABNORMAL HIGH (ref 0.61–1.24)
GFR, EST AFRICAN AMERICAN: 35 mL/min — AB (ref >60)
GFR, EST NON AFRICAN AMERICAN: 31 mL/min — AB (ref >60)
Glucose, Bld: 107 mg/dL — ABNORMAL HIGH (ref 65–99)
POTASSIUM: 4 mmol/L (ref 3.5–5.1)
SODIUM: 139 mmol/L (ref 135–145)

## 2015-03-29 LAB — POCT I-STAT 3, VENOUS BLOOD GAS (G3P V)
Bicarbonate: 26.2 mEq/L — ABNORMAL HIGH (ref 20.0–24.0)
O2 SAT: 92 %
PCO2 VEN: 48.3 mmHg (ref 45.0–50.0)
PO2 VEN: 68 mmHg — AB (ref 30.0–45.0)
TCO2: 28 mmol/L (ref 0–100)
pH, Ven: 7.343 — ABNORMAL HIGH (ref 7.250–7.300)

## 2015-03-29 LAB — GLUCOSE, CAPILLARY
GLUCOSE-CAPILLARY: 87 mg/dL (ref 65–99)
GLUCOSE-CAPILLARY: 84 mg/dL (ref 65–99)
GLUCOSE-CAPILLARY: 92 mg/dL (ref 65–99)

## 2015-03-29 LAB — PROTIME-INR
INR: 1.07 (ref 0.00–1.49)
Prothrombin Time: 14.1 seconds (ref 11.6–15.2)

## 2015-03-29 LAB — HEMOGLOBIN A1C
HEMOGLOBIN A1C: 8.6 % — AB (ref 4.8–5.6)
MEAN PLASMA GLUCOSE: 200 mg/dL

## 2015-03-29 LAB — TROPONIN I: Troponin I: <0.03 ng/mL (ref <0.031)

## 2015-03-29 SURGERY — RIGHT/LEFT HEART CATH AND CORONARY ANGIOGRAPHY
Anesthesia: LOCAL

## 2015-03-29 MED ORDER — HEPARIN SODIUM (PORCINE) 1000 UNIT/ML IJ SOLN
INTRAMUSCULAR | Status: AC
  Filled 2015-03-29: qty 1

## 2015-03-29 MED ORDER — MIDAZOLAM HCL 2 MG/2ML IJ SOLN
INTRAMUSCULAR | Status: DC | PRN
  Administered 2015-03-29: 1 mg via INTRAVENOUS

## 2015-03-29 MED ORDER — FENTANYL CITRATE (PF) 100 MCG/2ML IJ SOLN
INTRAMUSCULAR | Status: DC | PRN
  Administered 2015-03-29: 25 ug via INTRAVENOUS

## 2015-03-29 MED ORDER — SODIUM CHLORIDE 0.9 % IJ SOLN: 3.0000 mL | INTRAMUSCULAR | Status: DC | PRN

## 2015-03-29 MED ORDER — NITROGLYCERIN 1 MG/10 ML FOR IR/CATH LAB
INTRA_ARTERIAL | Status: AC
  Filled 2015-03-29: qty 10

## 2015-03-29 MED ORDER — HEPARIN SODIUM (PORCINE) 1000 UNIT/ML IJ SOLN
INTRAMUSCULAR | Status: DC | PRN
  Administered 2015-03-29: 5000 [IU] via INTRAVENOUS

## 2015-03-29 MED ORDER — FENTANYL CITRATE (PF) 100 MCG/2ML IJ SOLN
INTRAMUSCULAR | Status: AC
  Filled 2015-03-29: qty 4

## 2015-03-29 MED ORDER — SODIUM CHLORIDE 0.9 % IJ SOLN: 3.0000 mL | Freq: Two times a day (BID) | INTRAMUSCULAR | Status: DC

## 2015-03-29 MED ORDER — CLOPIDOGREL BISULFATE 75 MG PO TABS
75.0000 mg | ORAL_TABLET | Freq: Every day | ORAL | Status: DC
  Administered 2015-03-29: 75 mg via ORAL
  Filled 2015-03-29: qty 1

## 2015-03-29 MED ORDER — HEPARIN (PORCINE) IN NACL 2-0.9 UNIT/ML-% IJ SOLN
INTRAMUSCULAR | Status: AC
  Filled 2015-03-29: qty 1000

## 2015-03-29 MED ORDER — IOHEXOL 350 MG/ML SOLN
INTRAVENOUS | Status: DC | PRN
  Administered 2015-03-29: 100 mL via INTRACARDIAC

## 2015-03-29 MED ORDER — CLOPIDOGREL BISULFATE 75 MG PO TABS: 75.0000 mg | ORAL_TABLET | Freq: Every day | ORAL | Status: DC

## 2015-03-29 MED ORDER — SODIUM CHLORIDE 0.9 % IV SOLN: 250.0000 mL | INTRAVENOUS | Status: DC | PRN

## 2015-03-29 MED ORDER — ISOSORBIDE MONONITRATE ER 30 MG PO TB24: 30.0000 mg | ORAL_TABLET | Freq: Every day | ORAL | Status: DC

## 2015-03-29 MED ORDER — MIDAZOLAM HCL 2 MG/2ML IJ SOLN
INTRAMUSCULAR | Status: AC
  Filled 2015-03-29: qty 4

## 2015-03-29 MED ORDER — VERAPAMIL HCL 2.5 MG/ML IV SOLN
INTRAVENOUS | Status: AC
  Filled 2015-03-29: qty 2

## 2015-03-29 MED ORDER — PANTOPRAZOLE SODIUM 40 MG PO TBEC: 40.0000 mg | DELAYED_RELEASE_TABLET | Freq: Every day | ORAL | Status: DC

## 2015-03-29 MED ORDER — VERAPAMIL HCL 2.5 MG/ML IV SOLN
INTRAVENOUS | Status: DC | PRN
  Administered 2015-03-29: 10 mL via INTRA_ARTERIAL

## 2015-03-29 MED ORDER — SODIUM CHLORIDE 0.9 % WEIGHT BASED INFUSION: 1.0000 mL/kg/h | INTRAVENOUS | Status: AC

## 2015-03-29 MED ORDER — LIDOCAINE HCL (PF) 1 % IJ SOLN
INTRAMUSCULAR | Status: AC
  Filled 2015-03-29: qty 30

## 2015-03-29 MED ORDER — LIDOCAINE HCL (PF) 1 % IJ SOLN
INTRAMUSCULAR | Status: DC | PRN
  Administered 2015-03-29: 09:00:00

## 2015-03-29 SURGICAL SUPPLY — 14 items
CATH BALLN WEDGE 5F 110CM (CATHETERS) ×2 IMPLANT
CATH INFINITI 5 FR JL3.5 (CATHETERS) ×2 IMPLANT
CATH INFINITI 5FR ANG PIGTAIL (CATHETERS) ×2 IMPLANT
CATH INFINITI JR4 5F (CATHETERS) ×2 IMPLANT
CATH SITESEER 5F NTR (CATHETERS) ×2 IMPLANT
DEVICE RAD COMP TR BAND LRG (VASCULAR PRODUCTS) ×2 IMPLANT
GLIDESHEATH SLEND SS 6F .021 (SHEATH) ×2 IMPLANT
KIT HEART LEFT (KITS) ×2 IMPLANT
PACK CARDIAC CATHETERIZATION (CUSTOM PROCEDURE TRAY) ×2 IMPLANT
SHEATH FAST CATH BRACH 5F 5CM (SHEATH) ×2 IMPLANT
TRANSDUCER W/STOPCOCK (MISCELLANEOUS) ×2 IMPLANT
TUBING CIL FLEX 10 FLL-RA (TUBING) ×2 IMPLANT
WIRE HI TORQ VERSACORE-J 145CM (WIRE) ×2 IMPLANT
WIRE SAFE-T 1.5MM-J .035X260CM (WIRE) ×2 IMPLANT

## 2015-03-29 NOTE — Discharge Summary (Signed)
Physician Discharge Summary       Patient ID: Rodney Farley MRN: [id number] DOB/AGE: 1931/07/19 81 y.o.  Admit date: 03/28/2015 Discharge date: 03/29/2015 Primary Cardiologist:Dr. [name]   Discharge Diagnoses:  Principal Problem:   DOE (dyspnea on exertion), due to significant CAD Active Problems:   CAD (coronary artery disease), native coronary artery, in all vessels, significant   Ischemic cardiomyopathy   Pacemaker   Sleep apnea   Diabetes type 2, uncontrolled   HTN (hypertension)   Bilateral edema of lower extremity   Discharged Condition: good  Procedures: 03/29/15 cardiac cath by Dr. [name]  Hospital Course: 81 yo WM with extensive cardiac history. He is s/p MI x 2 in 1994 and 1995 while in [place]. He may have had angioplasty at that time but he really cannot recall. He had an Adenosine Myoview in 2010 that showed an anteroseptal and apical scar without ischemia. EF 49%. Echo at that time showed septal and apical akinesis with EF 40-45%. He had complete heart block in 2010 and had a pacemaker placed at that time.   More recently he presented this spring with symptoms of worsening dyspnea. He has to stop several times walking up his driveway. He feels his chest pounding and has some chest discomfort but no true pain. These symptoms have progressed over the past year and are very limiting for him. No cough, PND, orthopnea, or edema. Sometimes running a humidifier with menthol helps. No wheezing. He does have a history of poorly controlled DM, CKD stage 4, HTN, and hyperlipidemia. He has a remote history of tobacco use and has a history of COPD and moderate sleep apnea.   Evaluation with Echo noted EF down to 35-40%. PFTs looked remarkably good. We initially planned to do a right and left heart cath and he was admitted for overnight hydration. His creatinine however increased to 2.44 and his procedure was cancelled. He was evaluated by renal. Creatinine has since returned to  baseline of 1.8. Seen last month by Urgent care and complained of swelling in his feet. Lasix 40 mg daily added with improvement. He then saw Dr. [name] and plans were made for elective Rt and Lt heart cath with admit for hydration.  Pt admitted yesterday, + lower ext edema but no acute CHF, his lasix was held and he was gently hydrated overnight with IV at 50/hr, then rec'd fluids just before cath.  His Cr. Went from 2.15 to 1.95 with the fluids.  He underwent cardiac cath and found to have significant disease.  Cath: 1. Severe 3 vessel obstructive CAD.   -90% mid LAD stenosis at the takeoff of the second diagonal  -90% small first diagonal- this branch is too small for revascularization  -90% discrete mid second diagonal- this is a large branch  -90% third OM- large vessel  -70% distal LCx prior to PDA  - RV marginal occlusion off a nondominant RCA  Pt and Dr. [name] discussed PCIs vs. CABG.  Per Dr. [name] "The lesions in the LAD, second diagonal, and large OM are amenable to PCI and would achieve as good revascularization. After discussion concerning risk and benefits he would prefer to be treated with PCI."   It was decided once hydration was completed today he would be discharged and check BMET next week. He was seen and found stable by Dr. [name].   He will be admitted 04/08/15 for overnight hydration and PCI on the 27th. He will resume lasix today and begin Plavix.  Consults: None  Significant Diagnostic Studies:  BMP Latest Ref Rng 03/29/2015 03/28/2015 03/25/2015  Glucose 65 - 99 mg/dL 107(H) 185(H) 121(H)  BUN 6 - 20 mg/dL 31(H) 35(H) 38(H)  Creatinine 0.61 - 1.24 mg/dL 1.95(H) 2.14(H) 2.15(H)  Sodium 135 - 145 mmol/L 139 136 141  Potassium 3.5 - 5.1 mmol/L 4.0 4.3 4.4  Chloride 101 - 111 mmol/L 105 101 102  CO2 22 - 32 mmol/L 28 27 26   Calcium 8.9 - 10.3 mg/dL 8.9 8.9 9.2   CBC Latest Ref Rng 03/29/2015 03/28/2015 03/25/2015  WBC 4.0 - 10.5 K/uL 7.7 8.4 9.6    Hemoglobin 13.0 - 17.0 g/dL 13.7 13.1 13.2  Hematocrit 39.0 - 52.0 % 38.8(L) 38.4(L) 39.4  Platelets 150 - 400 K/uL 206 207 229     hgBA1C 8.6 TSH 1.6 Troponin < 0.03   PORTABLE CHEST - 1 VIEW COMPARISON: 04/06/2010 FINDINGS: Left chest wall pacer device is noted with lead in the right atrial appendage and right ventricle. There is moderate cardiac enlargement. There is no pleural effusion or edema. No airspace consolidation. IMPRESSION: 1. No acute cardiopulmonary abnormalities. 2. Cardiac enlargement   Venous doppler negative for DVT.  Cardiac Cath:  Acute Mrg lesion, 100% stenosed.  Ost 1st Diag to 1st Diag lesion, 90% stenosed.  2nd Diag lesion, 90% stenosed.  Mid LAD lesion, 90% stenosed.  3rd Mrg lesion, 90% stenosed.  Dist Cx lesion, 70% stenosed.  1. Severe 3 vessel obstructive CAD.   -90% mid LAD stenosis at the takeoff of the second diagonal  -90% small first diagonal- this branch is too small for revascularization  -90% discrete mid second diagonal- this is a large branch  -90% third OM- large vessel  -70% distal LCx prior to PDA  - RV marginal occlusion off a nondominant RCA 2. Mild pulmonary HTN with mildly elevated LV filling pressures.  Plan: The mid LAD, second diagonal, and third OM branches appear suitable for PCI +/- distal LCx. The other alternative would be CABG. Will discuss with patient and decide approach. Will hydrate post procedure and plan on DC later today. If PCI planned will need to stage due to CKD.    Discharge Exam: Blood pressure 127/82, pulse 57, temperature 97.9 F (36.6 C), temperature source Oral, resp. rate 9, height 5\' 5"  (1.651 m), weight 226 lb (102.513 kg), SpO2 95 %. General:Pleasant affect, NAD Skin:Warm and dry, brisk capillary refill HEENT:normocephalic, sclera clear, mucus membranes moist Heart:S1S2 RRR without murmur, gallup, rub or click Lungs:clear without rales, rhonchi, or  wheezes [id number], non tender, + BS, do not palpate liver spleen or masses [id number] lower ext edema, 2+ pedal pulses, 2+ radial pulses Neuro:alert and oriented X 3, MAE, follows commands, + facial symmetry  Disposition: 01-Home or Self Care     Medication List    STOP taking these medications        omeprazole 40 MG capsule  Commonly known as:  PRILOSEC  Replaced by:  pantoprazole 40 MG tablet      TAKE these medications        aspirin EC 81 MG tablet  Take 81 mg by mouth daily.     atorvastatin 40 MG tablet  Commonly known as:  LIPITOR  TAKE 1 TABLET BY MOUTH ONCE DAILY     carvedilol 12.5 MG tablet  Commonly known as:  COREG  Take 1 tablet (12.5 mg total) by mouth 2 (two) times daily with a meal.     clopidogrel 75 MG tablet  Commonly known as:  PLAVIX  Take 1 tablet (75 mg total) by mouth daily with breakfast.     furosemide 40 MG tablet  Commonly known as:  LASIX  Take 1 tablet (40 mg total) by mouth daily.     Insulin Glargine 100 UNIT/ML Solostar Pen  Commonly known as:  LANTUS SOLOSTAR  Inject 60 Units into the skin daily at 10 pm.     Insulin Pen Needle 31G X 5 MM Misc  Commonly known as:  B-D UF III MINI PEN NEEDLES  Use to test blood sugar 3 times daily. Dx code: 250.02     nitroGLYCERIN 0.4 MG SL tablet  Commonly known as:  NITROSTAT  Place 1 tablet (0.4 mg total) under the tongue every 5 (five) minutes x 3 doses as needed for chest pain.     pantoprazole 40 MG tablet  Commonly known as:  PROTONIX  Take 1 tablet (40 mg total) by mouth daily.     ranitidine 300 MG tablet  Commonly known as:  ZANTAC  TAKE 0.5 TABLETS (150 MG TOTAL) BY MOUTH 2 (TWO) TIMES DAILY.     sertraline 100 MG tablet  Commonly known as:  ZOLOFT  Take 1.5 tablets (150 mg total) by mouth daily.     tamsulosin 0.4 MG Caps capsule  Commonly known as:  FLOMAX  TAKE 1 CAPSULE BY MOUTH DAILY.     terazosin 1 MG capsule  Commonly known as:  HYTRIN  TAKE 1 CAPSULE BY MOUTH  DAILY AT BEDTIME     tetrahydrozoline-zinc 0.05-0.25 % ophthalmic solution  Commonly known as:  VISINE-AC  Place 2 drops into both eyes as needed (dry eyes).     vitamin B-12 1000 MCG tablet  Commonly known as:  CYANOCOBALAMIN  Take 1,000 mcg by mouth daily.       Follow-up Information    Follow up with Truitt Merle, NP On 04/17/2015.   Specialties:  Nurse Practitioner, Interventional Cardiology, Cardiology, Radiology   Why:  at 9:00AM   Contact information:   [place] [place] [place] [address] [phone]        Discharge Instructions: Call [place] at [phone] if any bleeding, swelling or drainage at cath site.  May shower, no tub baths for 48 hours for groin sticks. No lifting over 5 pounds for 3 days.  No Driving for 3 days.    Heart Healthy diabetic diet  We changed the prilosec to protonix because of interaction with plavix.   We added plavix to prepare you for stent.  You will come to the hospital on the 26th of Sept for IV fluids, like you did this time.  The admitting dept will call you that day.   You will need lab work done next week on Monday at [place] office.  They should have the order.     Signed: Isaiah Serge Nurse Practitioner-Certified [facility]: [facility] 03/29/2015, 3:09 PM  Time spent on discharge :  > 30 minutes.

## 2015-03-29 NOTE — Progress Notes (Signed)
VASCULAR LAB PRELIMINARY  PRELIMINARY  PRELIMINARY  PRELIMINARY  Bilateral lower extremity venous duplex completed.    Preliminary report:  Bilateral:  No evidence of DVT, superficial thrombosis, or Baker's Cyst.   SLAUGHTER, [place], RVS 03/29/2015, 4:25 PM

## 2015-03-29 NOTE — Progress Notes (Signed)
UR Completed Brenda Graves-Bigelow, RN,BSN [phone]  

## 2015-03-29 NOTE — Interval H&P Note (Signed)
History and Physical Interval Note:  03/29/2015 7:41 AM  Rodney Farley  has presented today for surgery, with the diagnosis of cp/shortness of breath  The various methods of treatment have been discussed with the patient and family. After consideration of risks, benefits and other options for treatment, the patient has consented to  Procedure(s): Right/Left Heart Cath and Coronary Angiography (N/A) as a surgical intervention .  [name] history has been reviewed, patient examined, no change in status, stable for surgery.  I have reviewed the patient's chart and labs.  Questions were answered to the patient's satisfaction.   Cath Lab Visit (complete for each Cath Lab visit)  Clinical Evaluation Leading to the Procedure:   ACS: No.  Non-ACS:    Anginal Classification: CCS III  Anti-ischemic medical therapy: Maximal Therapy (2 or more classes of medications)  Non-Invasive Test Results: Intermediate-risk stress test findings: cardiac mortality 1-3%/year  Prior CABG: No previous CABG        [facility] 03/29/2015 7:41 AM

## 2015-03-29 NOTE — Progress Notes (Signed)
Discussed cardiac cath findings with patient. Discussed possible treatment options including continued medical therapy, PCI, or CABG. Discussed that with DM and multivessel disease there is data to suggest that CABG may be the best option. He is 81 yo and has CKD stage 4 which may increase his surgical risk. The lesions in the LAD, second diagonal, and large OM are amenable to PCI and would achieve as good revascularization. After discussion concerning risk and benefits he would prefer to be treated with PCI.  Will complete hydration today. Repeat BMET early next week. Plan to return on September 26 for overnight hydration and PCI on the 27th. He should resume lasix today. Will start Plavix 75 mg daily.  Isidoro [name] MD, [facility]  03/29/2015 11:23 AM

## 2015-03-29 NOTE — Discharge Instructions (Signed)
Call [place] at [phone] if any bleeding, swelling or drainage at cath site.  May shower, no tub baths for 48 hours for groin sticks. No lifting over 5 pounds for 3 days.  No Driving for 3 days.    Heart Healthy diabetic diet  We changed the prilosec to protonix because of interaction with plavix.   We added plavix to prepare you for stent.  You will come to the hospital on the 26th of Sept for IV fluids, like you did this time.  The admitting dept will call you that day.   You will need lab work done next week on Monday at [place] office building- [facility] [address].  They should have the order.

## 2015-03-29 NOTE — Progress Notes (Signed)
Site area:Right brachial a 6 french venous sheath was removed  Site Prior to Removal:  Level 0  Pressure Applied For 15 MINUTES    Minutes Beginning at 0905am  Manual:   Yes.    Patient Status During Pull:  stable  Post Pull Groin Site:  Level 0  Post Pull Instructions Given:  Yes.    Post Pull Pulses Present:  Yes.    Dressing Applied:  Yes.    Comments:  VS remain stable during sheath pull.

## 2015-03-31 ENCOUNTER — Encounter: Payer: Self-pay | Admitting: Internal Medicine

## 2015-04-01 ENCOUNTER — Telehealth: Payer: Self-pay | Admitting: Cardiology

## 2015-04-01 ENCOUNTER — Other Ambulatory Visit: Payer: Self-pay | Admitting: Internal Medicine

## 2015-04-01 NOTE — Telephone Encounter (Signed)
Close encounter Error

## 2015-04-02 LAB — BASIC METABOLIC PANEL
BUN: 44 mg/dL — ABNORMAL HIGH (ref 7–25)
CHLORIDE: 105 mmol/L (ref 98–110)
CO2: 27 mmol/L (ref 20–31)
Calcium: 8.9 mg/dL (ref 8.6–10.3)
Creat: 2.46 mg/dL — ABNORMAL HIGH (ref 0.70–1.11)
GLUCOSE: 137 mg/dL — AB (ref 65–99)
POTASSIUM: 4.8 mmol/L (ref 3.5–5.3)
SODIUM: 142 mmol/L (ref 135–146)

## 2015-04-02 LAB — POCT I-STAT 3, VENOUS BLOOD GAS (G3P V)
BICARBONATE: 27.2 meq/L — AB (ref 20.0–24.0)
O2 SAT: 60 %
TCO2: 29 mmol/L (ref 0–100)
pCO2, Ven: 52.9 mmHg — ABNORMAL HIGH (ref 45.0–50.0)
pH, Ven: 7.319 — ABNORMAL HIGH (ref 7.250–7.300)
pO2, Ven: 34 mmHg (ref 30.0–45.0)

## 2015-04-03 ENCOUNTER — Ambulatory Visit: Payer: 59 | Admitting: Internal Medicine

## 2015-04-03 NOTE — Telephone Encounter (Signed)
Spoke to patient he stated for the past 2 days he is having pain in left lower abdomen and pain across lower back.Pain worse when he moves.Cecilie Kicks NP advised he needs to see PCP.

## 2015-04-04 ENCOUNTER — Ambulatory Visit (INDEPENDENT_AMBULATORY_CARE_PROVIDER_SITE_OTHER): Payer: 59 | Admitting: Family Medicine

## 2015-04-04 ENCOUNTER — Ambulatory Visit (INDEPENDENT_AMBULATORY_CARE_PROVIDER_SITE_OTHER): Payer: 59

## 2015-04-04 VITALS — BP 124/62 | HR 83 | Temp 98.2°F | Resp 18 | Ht 65.0 in | Wt 231.0 lb

## 2015-04-04 DIAGNOSIS — N189 Chronic kidney disease, unspecified: Secondary | ICD-10-CM | POA: Diagnosis not present

## 2015-04-04 DIAGNOSIS — N179 Acute kidney failure, unspecified: Secondary | ICD-10-CM | POA: Diagnosis not present

## 2015-04-04 DIAGNOSIS — H6123 Impacted cerumen, bilateral: Secondary | ICD-10-CM

## 2015-04-04 DIAGNOSIS — Z794 Long term (current) use of insulin: Secondary | ICD-10-CM

## 2015-04-04 DIAGNOSIS — N289 Disorder of kidney and ureter, unspecified: Principal | ICD-10-CM

## 2015-04-04 DIAGNOSIS — R1032 Left lower quadrant pain: Secondary | ICD-10-CM | POA: Diagnosis not present

## 2015-04-04 DIAGNOSIS — E119 Type 2 diabetes mellitus without complications: Secondary | ICD-10-CM

## 2015-04-04 DIAGNOSIS — H6120 Impacted cerumen, unspecified ear: Secondary | ICD-10-CM

## 2015-04-04 DIAGNOSIS — IMO0001 Reserved for inherently not codable concepts without codable children: Secondary | ICD-10-CM

## 2015-04-04 LAB — POC MICROSCOPIC URINALYSIS (UMFC): Mucus: ABSENT

## 2015-04-04 LAB — POCT URINALYSIS DIP (MANUAL ENTRY)
Bilirubin, UA: NEGATIVE
GLUCOSE UA: NEGATIVE
Ketones, POC UA: NEGATIVE
Leukocytes, UA: NEGATIVE
NITRITE UA: NEGATIVE
PH UA: 5
SPEC GRAV UA: 1.02
UROBILINOGEN UA: 0.2

## 2015-04-04 LAB — POCT CBC
Granulocyte percent: 60.7 %G (ref 37–80)
HEMATOCRIT: 39.9 % — AB (ref 43.5–53.7)
HEMOGLOBIN: 12.7 g/dL — AB (ref 14.1–18.1)
LYMPH, POC: 2.7 (ref 0.6–3.4)
MCH: 28.6 pg (ref 27–31.2)
MCHC: 31.9 g/dL (ref 31.8–35.4)
MCV: 89.7 fL (ref 80–97)
MID (cbc): 0.7 (ref 0–0.9)
MPV: 7.7 fL (ref 0–99.8)
POC GRANULOCYTE: 5.3 (ref 2–6.9)
POC LYMPH PERCENT: 31.4 %L (ref 10–50)
POC MID %: 7.9 %M (ref 0–12)
Platelet Count, POC: 235 10*3/uL (ref 142–424)
RBC: 4.45 M/uL — AB (ref 4.69–6.13)
RDW, POC: 15 %
WBC: 8.7 10*3/uL (ref 4.6–10.2)

## 2015-04-04 LAB — BASIC METABOLIC PANEL
BUN: 35 mg/dL — ABNORMAL HIGH (ref 7–25)
CHLORIDE: 102 mmol/L (ref 98–110)
CO2: 23 mmol/L (ref 20–31)
CREATININE: 2.26 mg/dL — AB (ref 0.70–1.11)
Calcium: 9.2 mg/dL (ref 8.6–10.3)
Glucose, Bld: 151 mg/dL — ABNORMAL HIGH (ref 65–99)
Potassium: 4.3 mmol/L (ref 3.5–5.3)
Sodium: 137 mmol/L (ref 135–146)

## 2015-04-04 LAB — GLUCOSE, POCT (MANUAL RESULT ENTRY): POC GLUCOSE: 141 mg/dL — AB (ref 70–99)

## 2015-04-04 MED ORDER — AMOXICILLIN-POT CLAVULANATE 875-125 MG PO TABS: 1.0000 | ORAL_TABLET | Freq: Two times a day (BID) | ORAL | Status: DC

## 2015-04-04 NOTE — Patient Instructions (Signed)
Your infection fighting cells were in the normal range today. As your abdominal pain has improved today from yesterday, no new medications needed today. However if your pain is persisting into tomorrow morning, can start Augmentin for possible diverticulitis. As we discussed, usually this is diagnosed with a CAT scan, but with your recent change in kidney function, would want to avoid use of scans with contrast for now if at all possible. Follow-up with me on Saturday for repeat hemoglobin and repeat exam. If your abdominal pain is worsening, or new or worsening symptoms with the abdominal pain, go to the emergency room as we discussed.  We should have the results of your kidney function test later today, and we'll call you about those results. If it has worsened further from your test a few days ago, would recommend emergency room evaluation. We will let you know these results and the plan.   Return to the clinic or go to the nearest emergency room if any of your symptoms worsen or new symptoms occur.  Abdominal Pain Many things can cause abdominal pain. Usually, abdominal pain is not caused by a disease and will improve without treatment. It can often be observed and treated at home. Your health care provider will do a physical exam and possibly order blood tests and X-rays to help determine the seriousness of your pain. However, in many cases, more time must pass before a clear cause of the pain can be found. Before that point, your health care provider may not know if you need more testing or further treatment. HOME CARE INSTRUCTIONS  Monitor your abdominal pain for any changes. The following actions may help to alleviate any discomfort you are experiencing:  Only take over-the-counter or prescription medicines as directed by your health care provider.  Do not take laxatives unless directed to do so by your health care provider.  Try a clear liquid diet (broth, tea, or water) as directed by your  health care provider. Slowly move to a bland diet as tolerated. SEEK MEDICAL CARE IF:  You have unexplained abdominal pain.  You have abdominal pain associated with nausea or diarrhea.  You have pain when you urinate or have a bowel movement.  You experience abdominal pain that wakes you in the night.  You have abdominal pain that is worsened or improved by eating food.  You have abdominal pain that is worsened with eating fatty foods.  You have a fever. SEEK IMMEDIATE MEDICAL CARE IF:   Your pain does not go away within 2 hours.  You keep throwing up (vomiting).  Your pain is felt only in portions of the abdomen, such as the right side or the left lower portion of the abdomen.  You pass bloody or black tarry stools. MAKE SURE YOU:  Understand these instructions.   Will watch your condition.   Will get help right away if you are not doing well or get worse.  Document Released: 04/08/2005 Document Revised: 07/04/2013 Document Reviewed: 03/08/2013 [facility] Patient Information 2015 [place], [place]. This information is not intended to replace advice given to you by your health care provider. Make sure you discuss any questions you have with your health care provider.

## 2015-04-04 NOTE — Progress Notes (Addendum)
Subjective:  This chart was scribed for Corliss Parish, MD by Leandra Kern, Medical Scribe. This patient was seen in Room 9 and the patient's care was started at 11:33 AM.   Patient ID: Rodney Farley, male    DOB: 25-Jul-1931, 81 y.o.   MRN: [id number]  Chief Complaint  Patient presents with  . Follow-up    Hospital/ 9-15 and 9-16  . Abdominal Pain    LLQ onset 4 days    HPI HPI Comments: Rodney Farley is a 81 y.o. male who presents to [facility] for a hospital follow up. He was recently discharged 09/15- 09/16 from the hospital due to dyspnea in exertion. History of CAD, and severe three vessel obstructive coronary dieses. Planning on admission 09/26 for overnight hydration and PCI for his coronary disease. He was resumed on his Lasix and started on Plavix.   Abdominal pain: Phone note for yesterday were reviewed where he was having a left lower abdominal pain and was recommended to follow up with [place]. Pt has a history of DM for which he takes Lattice. His blood sugar has been controlled in 80-90's last week. Hemoglobin A1C was 8.6 one week ago. Today pt reports that his LLQ abdominal pain started about 3 days ago. He reports that yesterday that pain was the most severe. He indicates that movement and pushing on the area does exacerbate the pain. He also notes symptoms of back pain however he believes that it is not related to his abdominal pain, as well as diarrhea however he indicates that this is not different from normal. He notes having 2 episodes of diarrhea per day. Pt reports no previous injuries to the area, or any previous similar experiences. Pt denies symptoms of fever, diarrhea, hematochezia, nausea, vomiting, dysuria, hematuria, chest pain.  Pt notes that his last colonoscopy was years ago.   CKD: He has history of chronic kidney disease, GERD, DM, and other medical problems. Most recent creatinine was 2.46 which was up from 11.95 three days prior.  Pt was having worsening swelling his lower leg, continue on the same dose of Lasix, and keep leg elevated..   Pt reports two other concerns today: He is requesting a refill for zploft ; and he indicates that he noticed ear wax on his finger while cleaning his ears today, he would like the area to be evaluated for obstruction.   Pt is from [place], and [place].    Patient Active Problem List   Diagnosis Date Noted  . Bilateral edema of lower extremity 03/29/2015  . DOE (dyspnea on exertion), due to significant CAD 03/28/2015  . CKD (chronic kidney disease) stage 4, GFR 15-29 ml/min 03/04/2015  . Unstable angina 10/16/2014  . Acute renal failure superimposed on stage 4 chronic kidney disease 10/16/2014  . Dyspnea on exertion 09/13/2014  . CAD (coronary artery disease), native coronary artery, in all vessels, significant 09/13/2014  . GERD (gastroesophageal reflux disease) 08/07/2012  . Renal insufficiency 08/07/2012  . Depression 08/05/2012  . Sleep apnea 08/05/2012  . Diabetes type 2, uncontrolled 08/05/2012  . HTN (hypertension) 08/05/2012  . Ischemic cardiomyopathy 11/03/2011  . Atrioventricular block, complete   . Obesity   . Pacemaker    Past Medical History  Diagnosis Date  . COPD (chronic obstructive pulmonary disease)     Severe  . Hypercholesterolemia   . Depression   . Renal insufficiency   . Coronary artery disease   .  Hypertension   . Atrioventricular block, complete   . Pacemaker     medtronic  . Obesity   . Sleep apnea   . Diabetes mellitus   . Old MI (myocardial infarction)   . Dyspnea on exertion 09/13/2014  . Anginal pain   . TIA (transient ischemic attack)     HISTORY OF   . Neuropathy     IN LOWER EXTREMITIES  . GERD (gastroesophageal reflux disease)   . DOE (dyspnea on exertion) 03/28/2015   Past Surgical History  Procedure Laterality Date  . Pacemaker insertion      Complete heart block status post DDD with good function   . Tonsillectomy    . Insert / replace / remove pacemaker    . Cholecystectomy    . Tonsillectomy    . Cardiac catheterization N/A 03/29/2015    Procedure: Right/Left Heart Cath and Coronary Angiography;  Surgeon: Keiji M [name], MD;  Location: [facility];  Service: Cardiovascular;  Laterality: N/A;   Allergies  Allergen Reactions  . Bee Venom Anaphylaxis  . Zocor [Simvastatin] Nausea Only and Other (See Comments)    Headache with brand name only.  Can take the generic.   Prior to Admission medications   Medication Sig Start Date End Date Taking? Authorizing Provider  aspirin EC 81 MG tablet Take 81 mg by mouth daily.   Yes Historical Provider, MD  atorvastatin (LIPITOR) 40 MG tablet Take 1 tablet (40 mg total) by mouth daily. PATIENT NEEDS OFFICE VISIT FOR ADDITIONAL REFILLS 03/29/15  Yes [name], MD  atorvastatin (LIPITOR) 40 MG tablet TAKE 1 TABLET BY MOUTH ONCE DAILY.  "OV NEEDED FOR REFILLS" 04/02/15  Yes Chelle Jeffery, PA-C  carvedilol (COREG) 12.5 MG tablet Take 1 tablet (12.5 mg total) by mouth 2 (two) times daily with a meal. 10/17/14  Yes Rogelia Mire, NP  clopidogrel (PLAVIX) 75 MG tablet Take 1 tablet (75 mg total) by mouth daily with breakfast. 03/29/15  Yes Isaiah Serge, NP  furosemide (LASIX) 40 MG tablet Take 1 tablet (40 mg total) by mouth daily. Patient taking differently: Take 40 mg by mouth daily. Sun tues thurs, sat is 40mg . All other days 60mg  02/27/15  Yes Roselee Culver, MD  Insulin Glargine (LANTUS SOLOSTAR) 100 UNIT/ML Solostar Pen Inject 60 Units into the skin daily at 10 pm. 01/09/14  Yes [name], MD  Insulin Pen Needle (B-D UF III MINI PEN NEEDLES) 31G X 5 MM MISC Use to test blood sugar 3 times daily. Dx code: 250.02 10/03/12  Yes [name], MD  isosorbide mononitrate (IMDUR) 30 MG 24 hr tablet Take 1 tablet (30 mg total) by mouth daily. 03/29/15  Yes Isaiah Serge, NP  nitroGLYCERIN (NITROSTAT) 0.4 MG SL tablet Place  1 tablet (0.4 mg total) under the tongue every 5 (five) minutes x 3 doses as needed for chest pain. 10/17/14  Yes Rogelia Mire, NP  pantoprazole (PROTONIX) 40 MG tablet Take 1 tablet (40 mg total) by mouth daily. 03/29/15  Yes Isaiah Serge, NP  sertraline (ZOLOFT) 100 MG tablet Take 1.5 tablets (150 mg total) by mouth daily. 01/02/15  Yes Elby Beck, FNP  tamsulosin (FLOMAX) 0.4 MG CAPS capsule TAKE 1 CAPSULE BY MOUTH DAILY. 01/30/15  Yes Tishira R Brewington, PA-C  terazosin (HYTRIN) 1 MG capsule TAKE 1 CAPSULE BY MOUTH DAILY AT BEDTIME 10/16/14  Yes [name], MD  tetrahydrozoline-zinc (VISINE-AC) 0.05-0.25 % ophthalmic solution Place  2 drops into both eyes as needed (dry eyes).   Yes Historical Provider, MD  vitamin B-12 (CYANOCOBALAMIN) 1000 MCG tablet Take 1,000 mcg by mouth daily.   Yes Historical Provider, MD  ranitidine (ZANTAC) 300 MG tablet TAKE 0.5 TABLETS (150 MG TOTAL) BY MOUTH 2 (TWO) TIMES DAILY. Patient not taking: Reported on 04/04/2015 09/27/13   [name], MD   Social History   Social History  . Marital Status: Married    Spouse Name: N/A  . Number of Children: 1  . Years of Education: N/A   Occupational History  . Not on file.   Social History Main Topics  . Smoking status: Former Smoker    Quit date: 07/18/2007  . Smokeless tobacco: Never Used  . Alcohol Use: Yes     Comment: Occa.  . Drug Use: No  . Sexual Activity: Not on file   Other Topics Concern  . Not on file   Social History Narrative    Review of Systems  Constitutional: Negative for fever.  HENT: Positive for ear discharge.   Cardiovascular: Negative for chest pain.  Gastrointestinal: Positive for abdominal pain and diarrhea. Negative for nausea, vomiting and blood in stool.  Genitourinary: Negative for dysuria and hematuria.  Musculoskeletal: Positive for back pain and neck pain.      Objective:   Physical Exam  Constitutional: He is oriented to person, place, and  time. He appears well-developed and well-nourished. No distress.  HENT:  Head: Normocephalic and atraumatic.  Cerumen  on bilateral canals, right greater than left, however no obstruction.    Eyes: EOM are normal. Pupils are equal, round, and reactive to light.  Neck: Neck supple.  Cardiovascular: Normal rate and regular rhythm.   Pulmonary/Chest: Effort normal and breath sounds normal. No respiratory distress. He has no wheezes. He has no rales.  Abdominal: There is no CVA tenderness and negative Murphy's sign.  Hyperactive bowel sounds, but no high pitched bowel sounds.  Pain into the LLQ as I push into the RLQ, otherwise RUQ is non tender. LLQ tenderness with slight guarding.   Musculoskeletal: He exhibits edema (1+ pedal edema ).  Neurological: He is alert and oriented to person, place, and time. No cranial nerve deficit.  Skin: Skin is warm and dry.  Psychiatric: He has a normal mood and affect. His behavior is normal.  Nursing note and vitals reviewed.  Filed Vitals:   04/04/15 1014  BP: 124/62  Pulse: 83  Temp: 98.2 F (36.8 C)  TempSrc: Oral  Resp: 18  Height: 5\' 5"  (1.651 m)  Weight: 231 lb (104.781 kg)  SpO2: 97%    Lavage performed for ears by medical assistant with relief of symptoms. No complications  Results for orders placed or performed in visit on 04/04/15  POCT CBC  Result Value Ref Range   WBC 8.7 4.6 - 10.2 K/uL   Lymph, poc 2.7 0.6 - 3.4   POC LYMPH PERCENT 31.4 10 - 50 %L   MID (cbc) 0.7 0 - 0.9   POC MID % 7.9 0 - 12 %M   POC Granulocyte 5.3 2 - 6.9   Granulocyte percent 60.7 37 - 80 %G   RBC 4.45 (A) 4.69 - 6.13 M/uL   Hemoglobin 12.7 (A) 14.1 - 18.1 g/dL   HCT, POC 39.9 (A) 43.5 - 53.7 %   MCV 89.7 80 - 97 fL   MCH, POC 28.6 27 - 31.2 pg   MCHC 31.9 31.8 - 35.4 g/dL  RDW, POC 15.0 %   Platelet Count, POC 235 142 - 424 K/uL   MPV 7.7 0 - 99.8 fL  POCT glucose (manual entry)  Result Value Ref Range   POC Glucose 141 (A) 70 - 99 mg/dl    POCT urinalysis dipstick  Result Value Ref Range   Color, UA yellow yellow   Clarity, UA clear clear   Glucose, UA negative negative   Bilirubin, UA negative negative   Ketones, POC UA negative negative   Spec Grav, UA 1.020    Blood, UA trace-lysed (A) negative   pH, UA 5.0    Protein Ur, POC >=300 (A) negative   Urobilinogen, UA 0.2    Nitrite, UA Negative Negative   Leukocytes, UA Negative Negative  POCT Microscopic Urinalysis ([facility])  Result Value Ref Range   WBC,UR,HPF,POC Few (A) None WBC/hpf   RBC,UR,HPF,POC Few (A) None RBC/hpf   Bacteria None None   Mucus Absent Absent   Epithelial Cells, UR Per Microscopy Moderate (A) None cells/hpf   labs above noted. Hemoglobin decreased from 13.7 approximate 6 days ago to 12.7 today.   [facility] reading (PRIMARY) by  Dr. Carlota Raspberry: acute abdominal series: nonspecific bowel gas findings, 1 possible air fluid level vs gastric bubble LUQ.      Assessment & Plan:  By signing my name below, I, Rawaa Al Rifaie, attest that this documentation has been prepared under the direction and in the presence of Corliss Parish, MD.  Leandra Kern, Medical Scribe. 04/04/2015.  11:33 AM.  Rodney Farley is a 81 y.o. male Acute on chronic renal insufficiency - Plan: Basic metabolic panel, POCT urinalysis dipstick, POCT Microscopic Urinalysis ([facility])  -repeat BMP stat - results reviewed - improved/stable. Avoiding contrast study for now.   LLQ abdominal pain - Plan: POCT CBC, POCT urinalysis dipstick, POCT Microscopic Urinalysis ([facility]), DG Abd Acute W/Chest, amoxicillin-clavulanate (AUGMENTIN) 875-125 MG per tablet  - possible early diverticulitis. Discussed CT scanning, but with renal disease deferred at this time. Will treat presumptively with Augmentin, recheck in 48 hrs - sooner or to ER if worse.   Cerumen impaction, unspecified laterality  -lavage in office with resolution of symptoms.   Insulin dependent diabetes mellitus - Plan: POCT glucose  (manual entry)  -glucose elevated, but unlikely cause of abdominal symptoms.    Meds ordered this encounter  Medications  . amoxicillin-clavulanate (AUGMENTIN) 875-125 MG per tablet    Sig: Take 1 tablet by mouth 2 (two) times daily.    Dispense:  20 tablet    Refill:  0   Patient Instructions  Your infection fighting cells were in the normal range today. As your abdominal pain has improved today from yesterday, no new medications needed today. However if your pain is persisting into tomorrow morning, can start Augmentin for possible diverticulitis. As we discussed, usually this is diagnosed with a CAT scan, but with your recent change in kidney function, would want to avoid use of scans with contrast for now if at all possible. Follow-up with me on Saturday for repeat hemoglobin and repeat exam. If your abdominal pain is worsening, or new or worsening symptoms with the abdominal pain, go to the emergency room as we discussed.  We should have the results of your kidney function test later today, and we'll call you about those results. If it has worsened further from your test a few days ago, would recommend emergency room evaluation. We will let you know these results and the plan.  Return to the clinic or go to the nearest emergency room if any of your symptoms worsen or new symptoms occur.  Abdominal Pain Many things can cause abdominal pain. Usually, abdominal pain is not caused by a disease and will improve without treatment. It can often be observed and treated at home. Your health care provider will do a physical exam and possibly order blood tests and X-rays to help determine the seriousness of your pain. However, in many cases, more time must pass before a clear cause of the pain can be found. Before that point, your health care provider may not know if you need more testing or further treatment. HOME CARE INSTRUCTIONS  Monitor your abdominal pain for any changes. The following actions  may help to alleviate any discomfort you are experiencing:  Only take over-the-counter or prescription medicines as directed by your health care provider.  Do not take laxatives unless directed to do so by your health care provider.  Try a clear liquid diet (broth, tea, or water) as directed by your health care provider. Slowly move to a bland diet as tolerated. SEEK MEDICAL CARE IF:  You have unexplained abdominal pain.  You have abdominal pain associated with nausea or diarrhea.  You have pain when you urinate or have a bowel movement.  You experience abdominal pain that wakes you in the night.  You have abdominal pain that is worsened or improved by eating food.  You have abdominal pain that is worsened with eating fatty foods.  You have a fever. SEEK IMMEDIATE MEDICAL CARE IF:   Your pain does not go away within 2 hours.  You keep throwing up (vomiting).  Your pain is felt only in portions of the abdomen, such as the right side or the left lower portion of the abdomen.  You pass bloody or black tarry stools. MAKE SURE YOU:  Understand these instructions.   Will watch your condition.   Will get help right away if you are not doing well or get worse.  Document Released: 04/08/2005 Document Revised: 07/04/2013 Document Reviewed: 03/08/2013 [facility] Patient Information 2015 [place], [place]. This information is not intended to replace advice given to you by your health care provider. Make sure you discuss any questions you have with your health care provider.         I personally performed the services described in this documentation, which was scribed in my presence. The recorded information has been reviewed and considered, and addended by me as needed.

## 2015-04-08 ENCOUNTER — Encounter (HOSPITAL_COMMUNITY): Payer: Self-pay | Admitting: Physician Assistant

## 2015-04-08 ENCOUNTER — Observation Stay (HOSPITAL_COMMUNITY)
Admission: RE | Admit: 2015-04-08 | Discharge: 2015-04-10 | Disposition: A | Discharge status: Home / Self Care | Payer: 59 | Source: Ambulatory Visit | Attending: Cardiology | Admitting: Cardiology

## 2015-04-08 DIAGNOSIS — Z79899 Other long term (current) drug therapy: Secondary | ICD-10-CM | POA: Insufficient documentation

## 2015-04-08 DIAGNOSIS — Z8673 Personal history of transient ischemic attack (TIA), and cerebral infarction without residual deficits: Secondary | ICD-10-CM | POA: Insufficient documentation

## 2015-04-08 DIAGNOSIS — K5792 Diverticulitis of intestine, part unspecified, without perforation or abscess without bleeding: Secondary | ICD-10-CM | POA: Diagnosis not present

## 2015-04-08 DIAGNOSIS — E1159 Type 2 diabetes mellitus with other circulatory complications: Secondary | ICD-10-CM | POA: Diagnosis present

## 2015-04-08 DIAGNOSIS — I2510 Atherosclerotic heart disease of native coronary artery without angina pectoris: Secondary | ICD-10-CM | POA: Diagnosis present

## 2015-04-08 DIAGNOSIS — J449 Chronic obstructive pulmonary disease, unspecified: Secondary | ICD-10-CM | POA: Diagnosis not present

## 2015-04-08 DIAGNOSIS — N186 End stage renal disease: Secondary | ICD-10-CM | POA: Diagnosis present

## 2015-04-08 DIAGNOSIS — I25708 Atherosclerosis of coronary artery bypass graft(s), unspecified, with other forms of angina pectoris: Secondary | ICD-10-CM | POA: Diagnosis present

## 2015-04-08 DIAGNOSIS — R600 Localized edema: Secondary | ICD-10-CM | POA: Diagnosis present

## 2015-04-08 DIAGNOSIS — F329 Major depressive disorder, single episode, unspecified: Secondary | ICD-10-CM | POA: Insufficient documentation

## 2015-04-08 DIAGNOSIS — I442 Atrioventricular block, complete: Secondary | ICD-10-CM | POA: Diagnosis not present

## 2015-04-08 DIAGNOSIS — Z23 Encounter for immunization: Secondary | ICD-10-CM | POA: Diagnosis not present

## 2015-04-08 DIAGNOSIS — N184 Chronic kidney disease, stage 4 (severe): Secondary | ICD-10-CM | POA: Insufficient documentation

## 2015-04-08 DIAGNOSIS — I25119 Atherosclerotic heart disease of native coronary artery with unspecified angina pectoris: Secondary | ICD-10-CM | POA: Diagnosis present

## 2015-04-08 DIAGNOSIS — I129 Hypertensive chronic kidney disease with stage 1 through stage 4 chronic kidney disease, or unspecified chronic kidney disease: Secondary | ICD-10-CM | POA: Diagnosis not present

## 2015-04-08 DIAGNOSIS — I252 Old myocardial infarction: Secondary | ICD-10-CM | POA: Diagnosis not present

## 2015-04-08 DIAGNOSIS — Z7902 Long term (current) use of antithrombotics/antiplatelets: Secondary | ICD-10-CM | POA: Insufficient documentation

## 2015-04-08 DIAGNOSIS — I25110 Atherosclerotic heart disease of native coronary artery with unstable angina pectoris: Secondary | ICD-10-CM | POA: Diagnosis not present

## 2015-04-08 DIAGNOSIS — E669 Obesity, unspecified: Secondary | ICD-10-CM | POA: Diagnosis not present

## 2015-04-08 DIAGNOSIS — K219 Gastro-esophageal reflux disease without esophagitis: Secondary | ICD-10-CM | POA: Diagnosis not present

## 2015-04-08 DIAGNOSIS — Z794 Long term (current) use of insulin: Secondary | ICD-10-CM | POA: Insufficient documentation

## 2015-04-08 DIAGNOSIS — Z950 Presence of cardiac pacemaker: Secondary | ICD-10-CM | POA: Diagnosis not present

## 2015-04-08 DIAGNOSIS — E1122 Type 2 diabetes mellitus with diabetic chronic kidney disease: Secondary | ICD-10-CM | POA: Insufficient documentation

## 2015-04-08 DIAGNOSIS — Z87891 Personal history of nicotine dependence: Secondary | ICD-10-CM | POA: Diagnosis not present

## 2015-04-08 DIAGNOSIS — G4730 Sleep apnea, unspecified: Secondary | ICD-10-CM | POA: Insufficient documentation

## 2015-04-08 DIAGNOSIS — I10 Essential (primary) hypertension: Secondary | ICD-10-CM | POA: Diagnosis present

## 2015-04-08 DIAGNOSIS — I209 Angina pectoris, unspecified: Secondary | ICD-10-CM | POA: Diagnosis present

## 2015-04-08 DIAGNOSIS — Z6837 Body mass index (BMI) 37.0-37.9, adult: Secondary | ICD-10-CM | POA: Diagnosis not present

## 2015-04-08 DIAGNOSIS — I5022 Chronic systolic (congestive) heart failure: Secondary | ICD-10-CM

## 2015-04-08 DIAGNOSIS — Z7982 Long term (current) use of aspirin: Secondary | ICD-10-CM | POA: Diagnosis not present

## 2015-04-08 DIAGNOSIS — I2584 Coronary atherosclerosis due to calcified coronary lesion: Secondary | ICD-10-CM | POA: Diagnosis not present

## 2015-04-08 DIAGNOSIS — E780 Pure hypercholesterolemia: Secondary | ICD-10-CM | POA: Diagnosis not present

## 2015-04-08 DIAGNOSIS — I25118 Atherosclerotic heart disease of native coronary artery with other forms of angina pectoris: Secondary | ICD-10-CM | POA: Diagnosis not present

## 2015-04-08 DIAGNOSIS — Z955 Presence of coronary angioplasty implant and graft: Secondary | ICD-10-CM

## 2015-04-08 DIAGNOSIS — R0609 Other forms of dyspnea: Secondary | ICD-10-CM | POA: Diagnosis present

## 2015-04-08 DIAGNOSIS — E1142 Type 2 diabetes mellitus with diabetic polyneuropathy: Secondary | ICD-10-CM | POA: Diagnosis not present

## 2015-04-08 DIAGNOSIS — I255 Ischemic cardiomyopathy: Secondary | ICD-10-CM | POA: Diagnosis not present

## 2015-04-08 HISTORY — DX: Type 2 diabetes mellitus with diabetic polyneuropathy: E11.42

## 2015-04-08 HISTORY — DX: Type 2 diabetes mellitus without complications: E11.9

## 2015-04-08 HISTORY — DX: Chronic kidney disease, stage 4 (severe): N18.4

## 2015-04-08 HISTORY — DX: Acute myocardial infarction, unspecified: I21.9

## 2015-04-08 HISTORY — DX: Chronic systolic (congestive) heart failure: I50.22

## 2015-04-08 HISTORY — DX: Anxiety disorder, unspecified: F41.9

## 2015-04-08 LAB — CBC
HEMATOCRIT: 37.1 % — AB (ref 39.0–52.0)
HEMOGLOBIN: 12.9 g/dL — AB (ref 13.0–17.0)
MCH: 31.5 pg (ref 26.0–34.0)
MCHC: 34.8 g/dL (ref 30.0–36.0)
MCV: 90.5 fL (ref 78.0–100.0)
Platelets: 224 10*3/uL (ref 150–400)
RBC: 4.1 MIL/uL — ABNORMAL LOW (ref 4.22–5.81)
RDW: 13.8 % (ref 11.5–15.5)
WBC: 8.9 10*3/uL (ref 4.0–10.5)

## 2015-04-08 LAB — COMPREHENSIVE METABOLIC PANEL
ALT: 22 U/L (ref 17–63)
ANION GAP: 6 (ref 5–15)
AST: 22 U/L (ref 15–41)
Albumin: 3.4 g/dL — ABNORMAL LOW (ref 3.5–5.0)
Alkaline Phosphatase: 62 U/L (ref 38–126)
BUN: 33 mg/dL — ABNORMAL HIGH (ref 6–20)
CHLORIDE: 106 mmol/L (ref 101–111)
CO2: 28 mmol/L (ref 22–32)
CREATININE: 2.26 mg/dL — AB (ref 0.61–1.24)
Calcium: 9.4 mg/dL (ref 8.9–10.3)
GFR, EST AFRICAN AMERICAN: 30 mL/min — AB (ref >60)
GFR, EST NON AFRICAN AMERICAN: 26 mL/min — AB (ref >60)
Glucose, Bld: 142 mg/dL — ABNORMAL HIGH (ref 65–99)
POTASSIUM: 4.8 mmol/L (ref 3.5–5.1)
SODIUM: 140 mmol/L (ref 135–145)
Total Bilirubin: 0.2 mg/dL — ABNORMAL LOW (ref 0.3–1.2)
Total Protein: 6.5 g/dL (ref 6.5–8.1)

## 2015-04-08 LAB — PROTIME-INR
INR: 1.03 (ref 0.00–1.49)
PROTHROMBIN TIME: 13.7 s (ref 11.6–15.2)

## 2015-04-08 LAB — GLUCOSE, CAPILLARY: Glucose-Capillary: 146 mg/dL — ABNORMAL HIGH (ref 65–99)

## 2015-04-08 MED ORDER — SODIUM CHLORIDE 0.9 % IJ SOLN
3.0000 mL | Freq: Two times a day (BID) | INTRAMUSCULAR | Status: DC
  Administered 2015-04-08 – 2015-04-10 (×2): 3 mL via INTRAVENOUS

## 2015-04-08 MED ORDER — INFLUENZA VAC SPLIT QUAD 0.5 ML IM SUSY
0.5000 mL | PREFILLED_SYRINGE | INTRAMUSCULAR | Status: AC
  Administered 2015-04-10: 0.5 mL via INTRAMUSCULAR
  Filled 2015-04-08 (×2): qty 0.5

## 2015-04-08 MED ORDER — ISOSORBIDE MONONITRATE ER 30 MG PO TB24
30.0000 mg | ORAL_TABLET | Freq: Every day | ORAL | Status: DC
  Administered 2015-04-09 – 2015-04-10 (×2): 30 mg via ORAL
  Filled 2015-04-08 (×2): qty 1

## 2015-04-08 MED ORDER — AMOXICILLIN-POT CLAVULANATE 875-125 MG PO TABS
1.0000 | ORAL_TABLET | Freq: Two times a day (BID) | ORAL | Status: DC
  Administered 2015-04-08: 1 via ORAL
  Filled 2015-04-08 (×2): qty 1

## 2015-04-08 MED ORDER — ASPIRIN EC 81 MG PO TBEC
81.0000 mg | DELAYED_RELEASE_TABLET | Freq: Every day | ORAL | Status: DC
  Administered 2015-04-10: 81 mg via ORAL
  Filled 2015-04-08 (×2): qty 1

## 2015-04-08 MED ORDER — SERTRALINE HCL 50 MG PO TABS
150.0000 mg | ORAL_TABLET | Freq: Every day | ORAL | Status: DC
Start: 2015-04-09 — End: 2015-04-10
  Administered 2015-04-10: 10:00:00 150 mg via ORAL
  Filled 2015-04-08: qty 1

## 2015-04-08 MED ORDER — CLOPIDOGREL BISULFATE 75 MG PO TABS
75.0000 mg | ORAL_TABLET | Freq: Every day | ORAL | Status: DC
  Administered 2015-04-09 – 2015-04-10 (×2): 75 mg via ORAL
  Filled 2015-04-08 (×2): qty 1

## 2015-04-08 MED ORDER — ONDANSETRON HCL 4 MG/2ML IJ SOLN: 4.0000 mg | Freq: Four times a day (QID) | INTRAMUSCULAR | Status: DC | PRN

## 2015-04-08 MED ORDER — NAPHAZOLINE HCL 0.1 % OP SOLN
2.0000 [drp] | Freq: Four times a day (QID) | OPHTHALMIC | Status: DC | PRN
  Filled 2015-04-08: qty 15

## 2015-04-08 MED ORDER — INSULIN ASPART 100 UNIT/ML ~~LOC~~ SOLN: 0.0000 [IU] | Freq: Three times a day (TID) | SUBCUTANEOUS | Status: DC

## 2015-04-08 MED ORDER — TERAZOSIN HCL 1 MG PO CAPS
1.0000 mg | ORAL_CAPSULE | Freq: Every day | ORAL | Status: DC
  Administered 2015-04-08 – 2015-04-09 (×2): 1 mg via ORAL
  Filled 2015-04-08 (×3): qty 1

## 2015-04-08 MED ORDER — SODIUM CHLORIDE 0.9 % IV SOLN: 250.0000 mL | INTRAVENOUS | Status: DC | PRN

## 2015-04-08 MED ORDER — CARVEDILOL 12.5 MG PO TABS
12.5000 mg | ORAL_TABLET | Freq: Two times a day (BID) | ORAL | Status: DC
  Administered 2015-04-08 – 2015-04-10 (×4): 12.5 mg via ORAL
  Filled 2015-04-08 (×3): qty 1

## 2015-04-08 MED ORDER — VITAMIN B-12 1000 MCG PO TABS
1000.0000 ug | ORAL_TABLET | Freq: Every day | ORAL | Status: DC
  Administered 2015-04-10: 1000 ug via ORAL
  Filled 2015-04-08: qty 1

## 2015-04-08 MED ORDER — PANTOPRAZOLE SODIUM 40 MG PO TBEC
40.0000 mg | DELAYED_RELEASE_TABLET | Freq: Every day | ORAL | Status: DC
  Administered 2015-04-09 – 2015-04-10 (×2): 40 mg via ORAL
  Filled 2015-04-08 (×2): qty 1

## 2015-04-08 MED ORDER — ATORVASTATIN CALCIUM 40 MG PO TABS
40.0000 mg | ORAL_TABLET | Freq: Every day | ORAL | Status: DC
  Administered 2015-04-09 – 2015-04-10 (×2): 40 mg via ORAL
  Filled 2015-04-08 (×2): qty 1

## 2015-04-08 MED ORDER — SODIUM CHLORIDE 0.9 % IJ SOLN: 3.0000 mL | INTRAMUSCULAR | Status: DC | PRN

## 2015-04-08 MED ORDER — TAMSULOSIN HCL 0.4 MG PO CAPS
0.4000 mg | ORAL_CAPSULE | Freq: Every day | ORAL | Status: DC
  Administered 2015-04-09: 0.4 mg via ORAL
  Filled 2015-04-08: qty 1

## 2015-04-08 MED ORDER — ASPIRIN 81 MG PO CHEW
81.0000 mg | CHEWABLE_TABLET | ORAL | Status: AC
  Administered 2015-04-09: 81 mg via ORAL
  Filled 2015-04-08: qty 1

## 2015-04-08 MED ORDER — INSULIN GLARGINE 100 UNIT/ML ~~LOC~~ SOLN
60.0000 [IU] | Freq: Every day | SUBCUTANEOUS | Status: DC
  Administered 2015-04-08: 30 [IU] via SUBCUTANEOUS
  Administered 2015-04-09: 60 [IU] via SUBCUTANEOUS
  Filled 2015-04-08 (×3): qty 0.6

## 2015-04-08 MED ORDER — NITROGLYCERIN 0.4 MG SL SUBL: 0.4000 mg | SUBLINGUAL_TABLET | SUBLINGUAL | Status: DC | PRN

## 2015-04-08 MED ORDER — INSULIN GLARGINE 100 UNIT/ML SOLOSTAR PEN: 60.0000 [IU] | PEN_INJECTOR | Freq: Every day | SUBCUTANEOUS | Status: DC

## 2015-04-08 MED ORDER — SODIUM CHLORIDE 0.9 % WEIGHT BASED INFUSION
1.0000 mL/kg/h | INTRAVENOUS | Status: DC
Start: 2015-04-08 — End: 2015-04-08

## 2015-04-08 MED ORDER — ACETAMINOPHEN 325 MG PO TABS: 650.0000 mg | ORAL_TABLET | ORAL | Status: DC | PRN

## 2015-04-08 MED ORDER — SODIUM CHLORIDE 0.9 % IV SOLN
INTRAVENOUS | Status: DC
  Administered 2015-04-08: 21:00:00 via INTRAVENOUS

## 2015-04-08 NOTE — Progress Notes (Addendum)
Pt is admitted to 2W05. Admission vital sign is stable, AOX4. Edema present in both leg R+2 L+3. Lung sound clear on ausculation. Dr [name] was paged and stated he is aware of the admission and his PA will put some order in shortly.

## 2015-04-08 NOTE — H&P (Signed)
History and Physical  Patient ID: Rodney Farley MRN: [id number], DOB: 01-12-1932 Date of Encounter: 04/08/2015, 5:19 PM Primary Physician: [name], MD Primary Cardiologist: Dr. [name]  Chief Complaint: chest pain Reason for Admission: planned PCI  HPI: Rodney Farley is an 81 y/o M with history of CAD (s/p MI in 1994/1995 while in [place] s/p questionable PCI, recent progression of disease), CHB 2010 s/p pacemaker, CKD stage IV, DM, HTN, HLD, remote tobacco abuse, COPD, moderate sleep apnea, chronic systolic CHF who returns for planned PCI. He recently had progression of DOE, chest discomfort and chest pounding, limiting his level of exertion. 2D Echo 09/2014 showed EF 35-40% with +WMA, grade 1 DD, mild MR. PFTs looked remarkably good. Dr. [name] planned to do a [facility] and admitted him for overnight hydration but Cr increased to 2.44 and procedure was cancelled. He was then evaluated by renal with return in Cr to 1.8. He was then seen by urgent care and complained of swelling in his feet thus Lasix 40mg  daily was added with improvement. He was then admitted for a second attempt on 03/28/15 and Cr was 2.14 (stable compared to prior outpatient 2.15). LE duplex negative for DVT. Cath showed severe 3V CAD - the mid LAD, second diagonal, and third OM branches appeared suitable for PCI +/- distal LCx. The other option would be CABG. After discussion concerning risk and benefits he would prefer to be treated with PCI. He was hydrated post-cath and started on Plavix 75mg  daily. F/u BMET on 9/19 showed Cr of 2.46 thus Lasix was held x 2 days then resumed at 40mg  daily. F/u level showed BUN/Cr 35/2.26. He is here today for direct admission for IV hydration in prep for planned staged PCI tomorrow at 0830.  The patient comes in today feeling well overall. He states he is happy to proceed with PCI so he can become a "breather, not a puffer." No CP today. He was seen last week for LLQ abdominal pain and  diagnosed with diverticulitis. He was started on Augmentin. No BRBPR, melena, hematemesis, nausea, vomiting. Abd pain has essentially resolved. Appetite good. Note: during recent cath there was significant tortuosity of the right innominate artery which made catheter manipulation difficult.  Past Medical History  Diagnosis Date  . COPD (chronic obstructive pulmonary disease)     Severe  . Hypercholesterolemia   . Depression   . Coronary artery disease     a. s/p MI in 1994/1995 while in [place] s/p questionable PCI. 03/2015: progression of disease, for staged PCI.  [name] Hypertension   . Atrioventricular block, complete     a. 2010 s/p pacemaker.  . Pacemaker     medtronic  . Obesity   . Sleep apnea   . Diabetes mellitus   . Old MI (myocardial infarction)   . TIA (transient ischemic attack)     HISTORY OF   . Neuropathy     IN LOWER EXTREMITIES  . GERD (gastroesophageal reflux disease)   . DOE (dyspnea on exertion) 03/28/2015  . CKD (chronic kidney disease), stage IV   . Chronic systolic CHF (congestive heart failure)     a. LVEF 35-40% by echo 09/2014.     Surgical History:  Past Surgical History  Procedure Laterality Date  . Pacemaker insertion      Complete heart block status post DDD with good function  . Tonsillectomy    . Insert / replace / remove pacemaker    . Cholecystectomy    .  Tonsillectomy    . Cardiac catheterization N/A 03/29/2015    Procedure: Right/Left Heart Cath and Coronary Angiography;  Surgeon: Worthington M [name], MD;  Location: [facility];  Service: Cardiovascular;  Laterality: N/A;     Home Meds: Prior to Admission medications   Medication Sig Start Date End Date Taking? Authorizing Provider  amoxicillin-clavulanate (AUGMENTIN) 875-125 MG per tablet Take 1 tablet by mouth 2 (two) times daily. 04/04/15   Wendie Agreste, MD  aspirin EC 81 MG tablet Take 81 mg by mouth daily.    Historical Provider, MD  atorvastatin (LIPITOR) 40 MG tablet Take 1  tablet (40 mg total) by mouth daily. PATIENT NEEDS OFFICE VISIT FOR ADDITIONAL REFILLS 03/29/15   [name], MD  atorvastatin (LIPITOR) 40 MG tablet TAKE 1 TABLET BY MOUTH ONCE DAILY.  "OV NEEDED FOR REFILLS" 04/02/15   Harrison Mons, PA-C  carvedilol (COREG) 12.5 MG tablet Take 1 tablet (12.5 mg total) by mouth 2 (two) times daily with a meal. 10/17/14   Rogelia Mire, NP  clopidogrel (PLAVIX) 75 MG tablet Take 1 tablet (75 mg total) by mouth daily with breakfast. 03/29/15   Isaiah Serge, NP  furosemide (LASIX) 40 MG tablet Take 1 tablet (40 mg total) by mouth daily. Patient taking differently: Take 40 mg by mouth daily. Sun tues thurs, sat is 40mg . All other days 60mg  02/27/15   Roselee Culver, MD  Insulin Glargine (LANTUS SOLOSTAR) 100 UNIT/ML Solostar Pen Inject 60 Units into the skin daily at 10 pm. 01/09/14   [name], MD  Insulin Pen Needle (B-D UF III MINI PEN NEEDLES) 31G X 5 MM MISC Use to test blood sugar 3 times daily. Dx code: 250.02 10/03/12   [name], MD  isosorbide mononitrate (IMDUR) 30 MG 24 hr tablet Take 1 tablet (30 mg total) by mouth daily. 03/29/15   Isaiah Serge, NP  nitroGLYCERIN (NITROSTAT) 0.4 MG SL tablet Place 1 tablet (0.4 mg total) under the tongue every 5 (five) minutes x 3 doses as needed for chest pain. 10/17/14   Rogelia Mire, NP  pantoprazole (PROTONIX) 40 MG tablet Take 1 tablet (40 mg total) by mouth daily. 03/29/15   Isaiah Serge, NP  ranitidine (ZANTAC) 300 MG tablet TAKE 0.5 TABLETS (150 MG TOTAL) BY MOUTH 2 (TWO) TIMES DAILY. Patient not taking: Reported on 04/04/2015 09/27/13   [name], MD  sertraline (ZOLOFT) 100 MG tablet Take 1.5 tablets (150 mg total) by mouth daily. 01/02/15   Elby Beck, FNP  tamsulosin (FLOMAX) 0.4 MG CAPS capsule TAKE 1 CAPSULE BY MOUTH DAILY. 01/30/15   Tishira R Brewington, PA-C  terazosin (HYTRIN) 1 MG capsule TAKE 1 CAPSULE BY MOUTH DAILY AT BEDTIME 10/16/14   [name], MD  tetrahydrozoline-zinc (VISINE-AC) 0.05-0.25 % ophthalmic solution Place 2 drops into both eyes as needed (dry eyes).    Historical Provider, MD  vitamin B-12 (CYANOCOBALAMIN) 1000 MCG tablet Take 1,000 mcg by mouth daily.    Historical Provider, MD    Allergies:  Allergies  Allergen Reactions  . Bee Venom Anaphylaxis  . Zocor [Simvastatin] Nausea Only and Other (See Comments)    Headache with brand name only.  Can take the generic.    Social History   Social History  . Marital Status: Married    Spouse Name: N/A  . Number of Children: 1  . Years of Education: N/A   Occupational History  . Not  on file.   Social History Main Topics  . Smoking status: Former Smoker    Quit date: 07/18/2007  . Smokeless tobacco: Never Used  . Alcohol Use: Yes     Comment: Occa.  . Drug Use: No  . Sexual Activity: Not on file   Other Topics Concern  . Not on file   Social History Narrative     Family History  Problem Relation Age of Onset  . Stroke Mother   . Leukemia Father   . Stroke Sister   . Heart attack Brother     Review of Systems:Has chronically loose stools, no change. All other systems reviewed and are otherwise negative except as noted above.  Labs:   Lab Results  Component Value Date   WBC 8.7 04/04/2015   HGB 12.7* 04/04/2015   HCT 39.9* 04/04/2015   MCV 89.7 04/04/2015   PLT 206 03/29/2015     Recent Labs Lab 04/04/15 1215  NA 137  K 4.3  CL 102  CO2 23  BUN 35*  CREATININE 2.26*  CALCIUM 9.2  GLUCOSE 151*   Lab Results  Component Value Date   CHOL 215* 09/19/2014   HDL 41 09/19/2014   LDLCALC 125* 09/19/2014   TRIG 247* 09/19/2014   No results found for: DDIMER  Radiology/Studies:  Portable Chest 1 View  April 16, 2015   CLINICAL DATA:  Dyspnea on exertion.  EXAM: PORTABLE CHEST - 1 VIEW  COMPARISON:  04/06/2010  FINDINGS: Left chest wall pacer device is noted with lead in the right atrial appendage and right ventricle. There is  moderate cardiac enlargement. There is no pleural effusion or edema. No airspace consolidation.  IMPRESSION: 1. No acute cardiopulmonary abnormalities. 2. Cardiac enlargement   Electronically Signed   By: Kerby Moors M.D.   On: 04/16/2015 15:08   Dg Abd Acute W/chest  04/04/2015   CLINICAL DATA:  Patient with left lower quadrant pain and diarrhea for 4 days.  EXAM: DG ABDOMEN ACUTE W/ 1V CHEST  COMPARISON:  Chest radiograph 04-16-15  FINDINGS: Multi lead pacer apparatus overlies the left hemi thorax, leads are stable in position. Stable enlarged cardiac and mediastinal contours with tortuosity of the thoracic aorta. No large pulmonary consolidation. No pleural effusion or pneumothorax. Apical pleural parenchymal thickening. Gas is demonstrated within nondilated loops of large and small bowel throughout the abdomen.  IMPRESSION: No acute cardiopulmonary process.  Nonobstructed bowel gas pattern.  Mild cardiomegaly.   Electronically Signed   By: Lovey Newcomer M.D.   On: 04/04/2015 15:22   Wt Readings from Last 3 Encounters:  04/08/15 232 lb 9.4 oz (105.5 kg)  04/04/15 231 lb (104.781 kg)  03/29/15 226 lb (102.513 kg)    EKG: April 16, 2015: AV paced 63bpm  Physical Exam Blood pressure 119/58, pulse 64, temperature 98 F (36.7 C), temperature source Oral, resp. rate 20, height 5\' 6"  (1.676 m), weight 232 lb 9.4 oz (105.5 kg), SpO2 97 %. General: Well developed, well nourished WM, in no acute distress. Head: Normocephalic, atraumatic, sclera non-icteric, no xanthomas, nares are without discharge.  Neck:  JVD not elevated. Lungs: Clear bilaterally to auscultation without wheezes, rales, or rhonchi. Breathing is unlabored. Heart: RRR with S1 S2. No murmurs, rubs, or gallops appreciated. Abdomen: Soft, non-tender, non-distended with normoactive bowel sounds. No hepatomegaly. No rebound/guarding. No obvious abdominal masses. Msk:  Strength and tone appear normal for age. Extremities: No clubbing or  cyanosis. Trace BLE edema.  Distal pedal pulses are 2+ and equal bilaterally. Neuro:  Alert and oriented X 3. No focal deficit. No facial asymmetry. Moves all extremities spontaneously. Psych:  Responds to questions appropriately with a normal affect.    ASSESSMENT AND PLAN:   1. CAD with recent progression of disease - plan staged PCI tomorrow as previously planned with risks/benefits/alternatives already having been discussed. Check labs, hydrate gently, and minimize contrast dye exposure. Note during prior cath "There was significant tortuosity of the right innominate artery which made catheter manipulation difficult." Per d/w Dr. Burt Knack will prep left wrist and right groin.  2. CKD stage IV - f/u BMET this evening and in AM. Hold Lasix. No other nephrotoxic agents on board to hold.  3. HTN - controlled.  4. HLD - continue statin.  5. Recent diverticulitis - now asymptomatic on abx. No bleeding noted by patient. Continue Augmentin.  6. Chronic systolic CHF - patient reports edema is at recent baseline. Hold Lasix and follow volume closely as above.  7. Diabetes mellitus - will continue Lantus but will order 1/2 dose insulin tonight on post-cath order set.  DVT ppx: Hold off on heparin SQ given cath in AM, but will need to consider initiation in renal function delays cath. Place SCDs for now.  Raechel Ache PA-C 04/08/2015, 5:19 PM Pager: [phone]  Patient seen, examined. Available data reviewed. Agree with findings, assessment, and plan as outlined by Melina Copa, PA-C. Exam reveals alert, oriented male in NAD. Lungs CTA, heart RRR without murmur, abd obese, nontender, ext with 1+ pedal edema. Recent notes and labs reviewed. Pt for multivessel PCI per Dr [name] tomorrow. He has had recent dx of possible diverticulitis but is clinically improved and had no leukocytosis or fever. Should be ok to proceed with PCI tomorrow. Will check baseline labs, hydrate overnight with 0.9% NS @ 75  cc/hr, and repeat a BMET tomorrow am. Prep left wrist and right groin as he had right subclavian/innominate tortuosity noted at diagnostic procedure.   I have reviewed the risks, indications, and alternatives to PCI (angioplasty and stenting) with the patient and his son who is at the bedside. Risks include but are not limited to bleeding, infection, vascular injury, stroke, myocardial infection, arrhythmia, kidney injury, radiation-related injury in the case of prolonged fluoroscopy use, emergency cardiac surgery, and death. The patient understands the risks of serious complication is low ([id number]).   Sherren Mocha, M.D. 04/08/2015 6:21 PM

## 2015-04-08 NOTE — Progress Notes (Signed)
Preliminarily made patient observation class just in case for some reason he goes home tomorrow. If he does stay and get PCI as planned can upgrade class to inpatient because he'll meet 2 midnight rule.  Dayna Dunn PA-C

## 2015-04-09 ENCOUNTER — Encounter (HOSPITAL_COMMUNITY)
Admission: RE | Disposition: A | Discharge status: Home / Self Care | Payer: Self-pay | Source: Ambulatory Visit | Attending: Cardiology

## 2015-04-09 ENCOUNTER — Encounter (HOSPITAL_COMMUNITY): Payer: Self-pay | Admitting: Cardiology

## 2015-04-09 ENCOUNTER — Other Ambulatory Visit: Payer: Self-pay

## 2015-04-09 DIAGNOSIS — I25110 Atherosclerotic heart disease of native coronary artery with unstable angina pectoris: Secondary | ICD-10-CM | POA: Diagnosis not present

## 2015-04-09 DIAGNOSIS — I25118 Atherosclerotic heart disease of native coronary artery with other forms of angina pectoris: Secondary | ICD-10-CM | POA: Diagnosis not present

## 2015-04-09 HISTORY — PX: CARDIAC CATHETERIZATION: SHX172

## 2015-04-09 LAB — CBC
HCT: 35.2 % — ABNORMAL LOW (ref 39.0–52.0)
Hemoglobin: 11.9 g/dL — ABNORMAL LOW (ref 13.0–17.0)
MCH: 30.8 pg (ref 26.0–34.0)
MCHC: 33.8 g/dL (ref 30.0–36.0)
MCV: 91.2 fL (ref 78.0–100.0)
PLATELETS: 217 10*3/uL (ref 150–400)
RBC: 3.86 MIL/uL — AB (ref 4.22–5.81)
RDW: 14.2 % (ref 11.5–15.5)
WBC: 8.6 10*3/uL (ref 4.0–10.5)

## 2015-04-09 LAB — GLUCOSE, CAPILLARY
GLUCOSE-CAPILLARY: 99 mg/dL (ref 65–99)
GLUCOSE-CAPILLARY: 123 mg/dL — AB (ref 65–99)
Glucose-Capillary: 78 mg/dL (ref 65–99)
Glucose-Capillary: 83 mg/dL (ref 65–99)

## 2015-04-09 LAB — BASIC METABOLIC PANEL
Anion gap: 6 (ref 5–15)
BUN: 31 mg/dL — AB (ref 6–20)
CO2: 29 mmol/L (ref 22–32)
Calcium: 8.8 mg/dL — ABNORMAL LOW (ref 8.9–10.3)
Chloride: 105 mmol/L (ref 101–111)
Creatinine, Ser: 2.03 mg/dL — ABNORMAL HIGH (ref 0.61–1.24)
GFR, EST AFRICAN AMERICAN: 34 mL/min — AB (ref >60)
GFR, EST NON AFRICAN AMERICAN: 29 mL/min — AB (ref >60)
Glucose, Bld: 90 mg/dL (ref 65–99)
POTASSIUM: 4.3 mmol/L (ref 3.5–5.1)
SODIUM: 140 mmol/L (ref 135–145)

## 2015-04-09 LAB — POCT ACTIVATED CLOTTING TIME: ACTIVATED CLOTTING TIME: 423 s

## 2015-04-09 SURGERY — CORONARY STENT INTERVENTION
Anesthesia: LOCAL

## 2015-04-09 MED ORDER — HYDRALAZINE HCL 20 MG/ML IJ SOLN: 10.0000 mg | Freq: Once | INTRAMUSCULAR | Status: DC

## 2015-04-09 MED ORDER — ANGIOPLASTY BOOK
Freq: Once | Status: AC
  Administered 2015-04-09: 22:00:00
  Filled 2015-04-09: qty 1

## 2015-04-09 MED ORDER — HEPARIN (PORCINE) IN NACL 2-0.9 UNIT/ML-% IJ SOLN
INTRAMUSCULAR | Status: DC | PRN
  Administered 2015-04-09: 10:00:00

## 2015-04-09 MED ORDER — OXYCODONE-ACETAMINOPHEN 5-325 MG PO TABS: 2.0000 | ORAL_TABLET | Freq: Four times a day (QID) | ORAL | Status: DC | PRN

## 2015-04-09 MED ORDER — SODIUM CHLORIDE 0.9 % IV SOLN
INTRAVENOUS | Status: DC | PRN
  Administered 2015-04-09: 75 mL/h via INTRAVENOUS

## 2015-04-09 MED ORDER — SODIUM CHLORIDE 0.9 % IJ SOLN: 3.0000 mL | INTRAMUSCULAR | Status: DC | PRN

## 2015-04-09 MED ORDER — FENTANYL CITRATE (PF) 100 MCG/2ML IJ SOLN
INTRAMUSCULAR | Status: DC | PRN
  Administered 2015-04-09: 25 ug via INTRAVENOUS

## 2015-04-09 MED ORDER — AMOXICILLIN-POT CLAVULANATE 875-125 MG PO TABS
1.0000 | ORAL_TABLET | Freq: Two times a day (BID) | ORAL | Status: DC
  Administered 2015-04-09 – 2015-04-10 (×3): 1 via ORAL
  Filled 2015-04-09 (×4): qty 1

## 2015-04-09 MED ORDER — BIVALIRUDIN BOLUS VIA INFUSION - CUPID
INTRAVENOUS | Status: DC | PRN
  Administered 2015-04-09: 78.3 mg via INTRAVENOUS

## 2015-04-09 MED ORDER — MIDAZOLAM HCL 2 MG/2ML IJ SOLN
INTRAMUSCULAR | Status: DC | PRN
  Administered 2015-04-09: 2 mg via INTRAVENOUS

## 2015-04-09 MED ORDER — FENTANYL CITRATE (PF) 100 MCG/2ML IJ SOLN
INTRAMUSCULAR | Status: AC
Start: 2015-04-09 — End: 2015-04-09
  Filled 2015-04-09: qty 4

## 2015-04-09 MED ORDER — SODIUM CHLORIDE 0.9 % IV SOLN
250.0000 mg | INTRAVENOUS | Status: DC | PRN
  Administered 2015-04-09 (×2): 1.75 mg/kg/h via INTRAVENOUS

## 2015-04-09 MED ORDER — LIDOCAINE HCL (PF) 1 % IJ SOLN
INTRAMUSCULAR | Status: AC
  Filled 2015-04-09: qty 30

## 2015-04-09 MED ORDER — NITROGLYCERIN 1 MG/10 ML FOR IR/CATH LAB
INTRA_ARTERIAL | Status: AC
  Filled 2015-04-09: qty 10

## 2015-04-09 MED ORDER — SODIUM CHLORIDE 0.9 % IJ SOLN: 3.0000 mL | Freq: Two times a day (BID) | INTRAMUSCULAR | Status: DC

## 2015-04-09 MED ORDER — MIDAZOLAM HCL 2 MG/2ML IJ SOLN
INTRAMUSCULAR | Status: AC
  Filled 2015-04-09: qty 4

## 2015-04-09 MED ORDER — BIVALIRUDIN 250 MG IV SOLR
INTRAVENOUS | Status: AC
Start: 2015-04-09 — End: 2015-04-09
  Filled 2015-04-09: qty 250

## 2015-04-09 MED ORDER — SODIUM CHLORIDE 0.9 % WEIGHT BASED INFUSION: 1.0000 mL/kg/h | INTRAVENOUS | Status: AC

## 2015-04-09 MED ORDER — IOHEXOL 350 MG/ML SOLN
INTRAVENOUS | Status: DC | PRN
  Administered 2015-04-09: 85 mL via INTRA_ARTERIAL

## 2015-04-09 MED ORDER — HEPARIN (PORCINE) IN NACL 2-0.9 UNIT/ML-% IJ SOLN
INTRAMUSCULAR | Status: AC
  Filled 2015-04-09: qty 1500

## 2015-04-09 MED ORDER — LIVING WELL WITH DIABETES BOOK
Freq: Once | Status: AC
  Administered 2015-04-09: 22:00:00
  Filled 2015-04-09: qty 1

## 2015-04-09 MED ORDER — BIVALIRUDIN 250 MG IV SOLR
INTRAVENOUS | Status: AC
  Filled 2015-04-09: qty 250

## 2015-04-09 MED ORDER — SODIUM CHLORIDE 0.9 % IV SOLN: 250.0000 mL | INTRAVENOUS | Status: DC | PRN

## 2015-04-09 SURGICAL SUPPLY — 20 items
BALLN EMERGE MR 2.25X12 (BALLOONS) ×2
BALLN EUPHORA RX 2.25X12 (BALLOONS) ×2
BALLN ~~LOC~~ EMERGE MR 3.0X12 (BALLOONS) ×2
BALLN ~~LOC~~ EMERGE MR 3.0X15 (BALLOONS) ×2
BALLOON EMERGE MR 2.25X12 (BALLOONS) ×1 IMPLANT
BALLOON EUPHORA RX 2.25X12 (BALLOONS) ×1 IMPLANT
BALLOON ~~LOC~~ EMERGE MR 3.0X12 (BALLOONS) ×1 IMPLANT
BALLOON ~~LOC~~ EMERGE MR 3.0X15 (BALLOONS) ×1 IMPLANT
CATH VISTA GUIDE 6FR XBLAD4 (CATHETERS) ×2 IMPLANT
KIT ENCORE 26 ADVANTAGE (KITS) ×2 IMPLANT
KIT HEART LEFT (KITS) ×2 IMPLANT
PACK CARDIAC CATHETERIZATION (CUSTOM PROCEDURE TRAY) ×2 IMPLANT
SHEATH PINNACLE 6F 10CM (SHEATH) ×2 IMPLANT
STENT PROMUS PREM MR 2.5X12 (Permanent Stent) ×2 IMPLANT
STENT PROMUS PREM MR 2.5X20 (Permanent Stent) ×2 IMPLANT
STENT PROMUS PREM MR 2.75X16 (Permanent Stent) ×2 IMPLANT
TRANSDUCER W/STOPCOCK (MISCELLANEOUS) ×2 IMPLANT
TUBING CIL FLEX 10 FLL-RA (TUBING) ×2 IMPLANT
WIRE ASAHI PROWATER 180CM (WIRE) ×6 IMPLANT
WIRE EMERALD 3MM-J .035X150CM (WIRE) ×2 IMPLANT

## 2015-04-09 NOTE — Progress Notes (Signed)
Site area: right groin  Site Prior to Removal:  Level 0  Pressure Applied For 20 MINUTES    Minutes Beginning at 1440  Manual:   Yes.    Patient Status During Pull:  stable  Post Pull Groin Site:  Level 0  Post Pull Instructions Given:  Yes.    Post Pull Pulses Present:  Yes.    Dressing Applied:  Yes.    Comments:

## 2015-04-09 NOTE — Progress Notes (Signed)
Patient sent to cath lab. Vitals stable. No cp at present.   Rene Gonsoulin, Mervin Kung RN

## 2015-04-09 NOTE — Interval H&P Note (Signed)
History and Physical Interval Note:  04/09/2015 8:39 AM  Rodney Farley  has presented today for surgery, with the diagnosis of CAD  The various methods of treatment have been discussed with the patient and family. After consideration of risks, benefits and other options for treatment, the patient has consented to  Procedure(s): Coronary Stent Intervention (N/A) as a surgical intervention .  [name] history has been reviewed, patient examined, no change in status, stable for surgery.  I have reviewed the patient's chart and labs.  Questions were answered to the patient's satisfaction.   Cath Lab Visit (complete for each Cath Lab visit)  Clinical Evaluation Leading to the Procedure:   ACS: No.  Non-ACS:    Anginal Classification: CCS III  Anti-ischemic medical therapy: Maximal Therapy (2 or more classes of medications)  Non-Invasive Test Results: Intermediate-risk stress test findings: cardiac mortality 1-3%/year  Prior CABG: No previous CABG        [facility] 04/09/2015 8:39 AM

## 2015-04-10 ENCOUNTER — Ambulatory Visit: Payer: Self-pay | Admitting: Internal Medicine

## 2015-04-10 ENCOUNTER — Other Ambulatory Visit: Payer: Self-pay | Admitting: Physician Assistant

## 2015-04-10 DIAGNOSIS — I10 Essential (primary) hypertension: Secondary | ICD-10-CM

## 2015-04-10 DIAGNOSIS — N184 Chronic kidney disease, stage 4 (severe): Secondary | ICD-10-CM

## 2015-04-10 DIAGNOSIS — I5022 Chronic systolic (congestive) heart failure: Secondary | ICD-10-CM

## 2015-04-10 DIAGNOSIS — I2510 Atherosclerotic heart disease of native coronary artery without angina pectoris: Secondary | ICD-10-CM

## 2015-04-10 DIAGNOSIS — Z9861 Coronary angioplasty status: Secondary | ICD-10-CM

## 2015-04-10 DIAGNOSIS — I25110 Atherosclerotic heart disease of native coronary artery with unstable angina pectoris: Secondary | ICD-10-CM | POA: Diagnosis not present

## 2015-04-10 DIAGNOSIS — R0609 Other forms of dyspnea: Secondary | ICD-10-CM | POA: Diagnosis not present

## 2015-04-10 DIAGNOSIS — I255 Ischemic cardiomyopathy: Secondary | ICD-10-CM

## 2015-04-10 DIAGNOSIS — I200 Unstable angina: Secondary | ICD-10-CM

## 2015-04-10 DIAGNOSIS — I25118 Atherosclerotic heart disease of native coronary artery with other forms of angina pectoris: Secondary | ICD-10-CM | POA: Diagnosis not present

## 2015-04-10 LAB — BASIC METABOLIC PANEL
ANION GAP: 7 (ref 5–15)
BUN: 28 mg/dL — ABNORMAL HIGH (ref 6–20)
CALCIUM: 8.7 mg/dL — AB (ref 8.9–10.3)
CO2: 27 mmol/L (ref 22–32)
Chloride: 105 mmol/L (ref 101–111)
Creatinine, Ser: 1.96 mg/dL — ABNORMAL HIGH (ref 0.61–1.24)
GFR, EST AFRICAN AMERICAN: 35 mL/min — AB (ref >60)
GFR, EST NON AFRICAN AMERICAN: 30 mL/min — AB (ref >60)
Glucose, Bld: 82 mg/dL (ref 65–99)
Potassium: 4 mmol/L (ref 3.5–5.1)
SODIUM: 139 mmol/L (ref 135–145)

## 2015-04-10 LAB — CBC
HCT: 35.9 % — ABNORMAL LOW (ref 39.0–52.0)
HEMOGLOBIN: 11.8 g/dL — AB (ref 13.0–17.0)
MCH: 29.9 pg (ref 26.0–34.0)
MCHC: 32.9 g/dL (ref 30.0–36.0)
MCV: 90.9 fL (ref 78.0–100.0)
PLATELETS: 201 10*3/uL (ref 150–400)
RBC: 3.95 MIL/uL — AB (ref 4.22–5.81)
RDW: 14.1 % (ref 11.5–15.5)
WBC: 8.4 10*3/uL (ref 4.0–10.5)

## 2015-04-10 LAB — GLUCOSE, CAPILLARY
GLUCOSE-CAPILLARY: 72 mg/dL (ref 65–99)
Glucose-Capillary: 112 mg/dL — ABNORMAL HIGH (ref 65–99)

## 2015-04-10 MED ORDER — HYDRALAZINE HCL 25 MG PO TABS: 25.0000 mg | ORAL_TABLET | Freq: Two times a day (BID) | ORAL | Status: DC

## 2015-04-10 MED ORDER — HYDRALAZINE HCL 25 MG PO TABS
25.0000 mg | ORAL_TABLET | Freq: Two times a day (BID) | ORAL | Status: DC
  Administered 2015-04-10: 11:00:00 25 mg via ORAL
  Filled 2015-04-10: qty 1

## 2015-04-10 NOTE — Progress Notes (Signed)
Subjective: The patient reports feeling amazingly better. His exercise tolerance has already improved immensely.  Objective: Vital signs in last 24 hours: Temp:  [97.7 F (36.5 C)-98.5 F (36.9 C)] 98.5 F (36.9 C) (09/28 0745) Pulse Rate:  [58-70] 69 (09/28 0927) Resp:  [12-23] 18 (09/28 0745) BP: (110-178)/(44-82) 178/64 mmHg (09/28 0927) SpO2:  [0 %-100 %] 97 % (09/28 0745) Weight:  [231 lb 7.7 oz (105 kg)] 231 lb 7.7 oz (105 kg) (09/28 0419) Last BM Date: 04/10/15  Intake/Output from previous day: 09/27 0701 - 09/28 0700 In: 480 [P.O.:480] Out: 1400 [Urine:1400] Intake/Output this shift: Total I/O In: 240 [P.O.:240] Out: -   Medications Scheduled Meds: . amoxicillin-clavulanate  1 tablet Oral BID  . aspirin EC  81 mg Oral Daily  . atorvastatin  40 mg Oral Daily  . carvedilol  12.5 mg Oral BID WC  . clopidogrel  75 mg Oral Q breakfast  . hydrALAZINE  10 mg Intravenous Once  . Influenza vac split quadrivalent PF  0.5 mL Intramuscular Tomorrow-1000  . insulin aspart  0-9 Units Subcutaneous TID WC  . insulin glargine  60 Units Subcutaneous QHS  . isosorbide mononitrate  30 mg Oral Daily  . pantoprazole  40 mg Oral Daily  . sertraline  150 mg Oral Daily  . sodium chloride  3 mL Intravenous Q12H  . tamsulosin  0.4 mg Oral Daily  . terazosin  1 mg Oral QHS  . vitamin B-12  1,000 mcg Oral Daily   Continuous Infusions:  PRN Meds:.sodium chloride, acetaminophen, naphazoline, nitroGLYCERIN, ondansetron (ZOFRAN) IV, oxyCODONE-acetaminophen, sodium chloride  PE: General appearance: alert, cooperative and no distress Lungs: clear to auscultation bilaterally Heart: regular rate and rhythm, S1, S2 normal, no murmur, click, rub or gallop Extremities: No LEE.  1+ upper extremity edema. Pulses: 2+ and symmetric Skin: Warm and dry.  Smal hematoma and area of ecchymosis in the right groin.  nontender.  Neurologic: Grossly normal  Lab Results:   Recent Labs   04/08/15 1920 04/09/15 0407 04/10/15 0407  WBC 8.9 8.6 8.4  HGB 12.9* 11.9* 11.8*  HCT 37.1* 35.2* 35.9*  PLT 224 217 201   BMET  Recent Labs  04/08/15 1920 04/09/15 0407 04/10/15 0407  NA 140 140 139  K 4.8 4.3 4.0  CL 106 105 105  CO2 28 29 27   GLUCOSE 142* 90 82  BUN 33* 31* 28*  CREATININE 2.26* 2.03* 1.96*  CALCIUM 9.4 8.8* 8.7*   PT/INR  Recent Labs  04/08/15 1920  LABPROT 13.7  INR 1.03    Assessment/Plan  Active Problems:   Ischemic cardiomyopathy   HTN (hypertension)   CAD (coronary artery disease), native coronary artery, in all vessels, significant   Unstable angina   CKD (chronic kidney disease) stage 4, GFR 15-29 ml/min   DOE (dyspnea on exertion), due to significant CAD   Bilateral edema of lower extremity   Chronic systolic CHF (congestive heart failure)   Diverticulitis    Patient presented for planned PCI.  He underwent successful stenting to the mid LAD second diagonal and OM 3 with drug-eluting stents.  His EDP was elevated. We'll need to reassess LV function in 3 months. There is no improvement in ejection fraction will need to consider upgrading her pacemaker to a CRT/ICD device.  He is on aspirin and Plavix along with Lipitor 40 mg Imdur 30 mg.  Serum creatinine stable.  Neck fluids -0.9 L/-0.5 L without getting any diuretic.  We'll continue home  dosing of Lasix.  Ejection fraction 35-40% by echo in March 2016.  Given chronic kidney disease will add by mouth hydralazine for afterload better blood pressure control.  The patient ambulated 1000 feet without difficulties with cardiac rehabilitation.  He reports feeling amazingly better.  He has not walked as far in a very very long time.Burnis Medin discharge home this morning. Phase II cardiac rehabilitation recommended.   LOS: 2 days    HAGER, BRYAN PA-C 04/10/2015 9:31 AM  I have seen, examined and evaluated the patient this AM along with MR. Hager, PA-C.  After reviewing all the available  data and chart,  I agree with His findings, examination as well as impression recommendations.  Admitted for planned PCI via femoral approach --pre-hydration overnight for CKD IV Feels much better after multivessel PCI - only has distal Cx 70% lesion - planned Med Rx. Will need OP Cr check after d/c.   On Coreg, Satin ASA/Plavix Added low dose Hydralazine for HTN & reduced EF -- already on Nitrate.    Feels well, groin has mild ecchymoses,but stable.  OK for d/c today.  Leonie Man, M.D., M.S. Interventional Cardiologist   Pager # [phone]

## 2015-04-10 NOTE — Discharge Summary (Addendum)
Physician Discharge Summary     Cardiologist:  [name] Patient ID: Rodney Farley MRN: [id number] DOB/AGE: 1932/07/09 81 y.o.  Admit date: 04/08/2015 Discharge date: 04/10/2015  Admission Diagnoses:  Unstable angina  Discharge Diagnoses:  Active Problems:   Ischemic cardiomyopathy   HTN (hypertension)   CAD (coronary artery disease), native coronary artery, in all vessels, significant   Unstable angina   CKD (chronic kidney disease) stage 4, GFR 15-29 ml/min   DOE (dyspnea on exertion), due to significant CAD   Bilateral edema of lower extremity   Chronic systolic CHF (congestive heart failure)   Diverticulitis   Discharged Condition: stable  Hospital Course:  Rodney Farley is an 81 y/o M with history of CAD (s/p MI in 1994/1995 while in [place] s/p questionable PCI, recent progression of disease), CHB 2010 s/p pacemaker, CKD stage IV, DM, HTN, HLD, remote tobacco abuse, COPD, moderate sleep apnea, chronic systolic CHF who returns for planned PCI. He recently had progression of DOE, chest discomfort and chest pounding, limiting his level of exertion. 2D Echo 09/2014 showed EF 35-40% with +WMA, grade 1 DD, mild MR. PFTs looked remarkably good. Dr. [name] planned to do a [facility] and admitted him for overnight hydration but Cr increased to 2.44 and procedure was cancelled. He was then evaluated by renal with return in Cr to 1.8. He was then seen by urgent care and complained of swelling in his feet thus Lasix 40mg  daily was added with improvement. He was then admitted for a second attempt on 03/28/15 and Cr was 2.14 (stable compared to prior outpatient 2.15). LE duplex negative for DVT. Cath showed severe 3V CAD - the mid LAD, second diagonal, and third OM branches appeared suitable for PCI +/- distal LCx. The other option would be CABG. After discussion concerning risk and benefits he would prefer to be treated with PCI. He was hydrated post-cath and started on Plavix 75mg  daily. F/u BMET on  9/19 showed Cr of 2.46 thus Lasix was held x 2 days then resumed at 40mg  daily. F/u level showed BUN/Cr 35/2.26. He is here today for direct admission for IV hydration in prep for planned staged PCI tomorrow at 0830.  The patient comes in today feeling well overall. He states he is happy to proceed with PCI so he can become a "breather, not a puffer." No CP today. He was seen last week for LLQ abdominal pain and diagnosed with diverticulitis. He was started on Augmentin. No BRBPR, melena, hematemesis, nausea, vomiting. Abd pain has essentially resolved. Appetite good. Note: during recent cath there was significant tortuosity of the right innominate artery which made catheter manipulation difficult.  Patient underwent left heart catheterization revealing Mid LAD lesion, 90% stenosed, 2nd Diag lesion, 90% stenosed, 3rd Mrg lesion, 90% stenosed.  He received drug-eluting stents to each lesion.  Distal 70% circumflex lesion will be treated medically.He was started on aspirin and Plavix. Continue Lipitor 40 Imdur 30 mg Coreg. Serum creatinine stable post cath. We'll recheck this on Friday.   The patient ambulated 1000 feet without difficulties with cardiac rehabilitation. Phase II cardiac rehabilitation recommended.  He reports feeling amazingly better. He has not walked this far in a very, very long time.  Patient had mild ecchymosis and a small hematoma in his right groin cath site. This was nontender.   We added hydralazine twice daily for afterload reduction and better blood pressure control. This will likely need to be titrated at his office follow-up.  The patient was seen by  Dr. Ellyn Hack who felt he was stable for DC home.  Recheck 2-D echocardiogram in 3 months.   Consults: cardiac rehabilitation  Significant Diagnostic Studies:   Conclusion     Acute Mrg lesion, 100% stenosed.  Ost 1st Diag to 1st Diag lesion, 90% stenosed.  Dist Cx lesion, 70% stenosed.  2nd Diag lesion, 90% stenosed.  There is a 0% residual stenosis post intervention.  A drug-eluting stent was placed.  Mid LAD lesion, 90% stenosed. There is a 0% residual stenosis post intervention.  A drug-eluting stent was placed.  3rd Mrg lesion, 90% stenosed. There is a 0% residual stenosis post intervention.  A drug-eluting stent was placed.  1. Successful stenting of the mid LAD, second diagonal, and OM3 with DES.  Plan: observe overnight with hydration. Assess renal function in AM. Plan to resume lasix at DC due to elevated EDP. DAPT for one year. Anticipate DC in am if no complications. Will need to reassess LV function in 3 months. If EF remains low consider upgrading pacemaker to a CRT/ICD device.     Coronary Diagrams    Diagnostic Diagram           Post-Intervention Diagram          Treatments: See above  Discharge Exam: Blood pressure 110/55, pulse 69, temperature 98.5 F (36.9 C), temperature source Oral, resp. rate 18, height 5\' 6"  (1.676 m), weight 231 lb 7.7 oz (105 kg), SpO2 97 %.   Disposition: 01-Home or Self Care      Discharge Instructions    Amb Referral to Cardiac Rehabilitation    Complete by:  As directed   Congestive Heart Failure: If diagnosis is Heart Failure, patient MUST meet each of the CMS criteria: 1. Left Ventricular Ejection Fraction </= 35% 2. NYHA class II-IV symptoms despite being on optimal heart failure therapy for at least 6 weeks. 3. Stable = have not had a recent (<6 weeks) or planned (<6 months) major cardiovascular hospitalization or procedure  Program Details: - Physician supervised classes - 1-3 classes per week over a 12-18 week period, generally for a total of 36 sessions  Physician Certification: I certify that the above Cardiac Rehabilitation treatment is medically necessary and is medically approved by me for treatment of this patient. The patient is willing and cooperative, able to ambulate and medically stable to participate in  exercise rehabilitation. The participant's progress and Individualized Treatment Plan will be reviewed by the Medical Director, Cardiac Rehab staff and as indicated by the Referring/Ordering Physician.  Diagnosis:  PCI     Diet - low sodium heart healthy    Complete by:  As directed      Discharge instructions    Complete by:  As directed   No lifting more than a half gallon of milk or driving for three days.     Increase activity slowly    Complete by:  As directed             Medication List    TAKE these medications        amoxicillin-clavulanate 875-125 MG tablet  Commonly known as:  AUGMENTIN  Take 1 tablet by mouth 2 (two) times daily.     aspirin EC 81 MG tablet  Take 81 mg by mouth daily.     atorvastatin 40 MG tablet  Commonly known as:  LIPITOR  Take 1 tablet (40 mg total) by mouth daily. PATIENT NEEDS OFFICE VISIT FOR ADDITIONAL REFILLS     carvedilol 12.5  MG tablet  Commonly known as:  COREG  Take 1 tablet (12.5 mg total) by mouth 2 (two) times daily with a meal.     clopidogrel 75 MG tablet  Commonly known as:  PLAVIX  Take 1 tablet (75 mg total) by mouth daily with breakfast.     furosemide 40 MG tablet  Commonly known as:  LASIX  Take 1 tablet (40 mg total) by mouth daily.     hydrALAZINE 25 MG tablet  Commonly known as:  APRESOLINE  Take 1 tablet (25 mg total) by mouth 2 (two) times daily.     Insulin Glargine 100 UNIT/ML Solostar Pen  Commonly known as:  LANTUS SOLOSTAR  Inject 60 Units into the skin daily at 10 pm.     Insulin Pen Needle 31G X 5 MM Misc  Commonly known as:  B-D UF III MINI PEN NEEDLES  Use to test blood sugar 3 times daily. Dx code: 250.02     isosorbide mononitrate 30 MG 24 hr tablet  Commonly known as:  IMDUR  Take 1 tablet (30 mg total) by mouth daily.     nitroGLYCERIN 0.4 MG SL tablet  Commonly known as:  NITROSTAT  Place 1 tablet (0.4 mg total) under the tongue every 5 (five) minutes x 3 doses as needed for chest  pain.     pantoprazole 40 MG tablet  Commonly known as:  PROTONIX  Take 1 tablet (40 mg total) by mouth daily.     sertraline 100 MG tablet  Commonly known as:  ZOLOFT  Take 1.5 tablets (150 mg total) by mouth daily.     tamsulosin 0.4 MG Caps capsule  Commonly known as:  FLOMAX  TAKE 1 CAPSULE BY MOUTH DAILY.     terazosin 1 MG capsule  Commonly known as:  HYTRIN  TAKE 1 CAPSULE BY MOUTH DAILY AT BEDTIME     tetrahydrozoline-zinc 0.05-0.25 % ophthalmic solution  Commonly known as:  VISINE-AC  Place 2 drops into both eyes as needed (dry eyes).     vitamin B-12 1000 MCG tablet  Commonly known as:  CYANOCOBALAMIN  Take 1,000 mcg by mouth daily.       Follow-up Information    Follow up with Truitt Merle, NP On 04/17/2015.   Specialties:  Nurse Practitioner, Interventional Cardiology, Cardiology, Radiology   Why:  9:00 AM   Contact information:   [place]. [address] [place] [address] [phone]       Follow up with Labs On 04/12/2015.   Why:  Go anytime to the [address] office on 9/30 to have your kidney function checked.    Contact information:   [employer]. [address] [place] [address] [phone]      Signed: Tarri Fuller, PAC 04/10/2015, 11:19 AM  I saw and evaluated the patient this morning prior to discharge. The discharge summary. Admitted for catheterization given symptoms of progressive angina. Have multivessel disease treated with multisite PCI. Feels better today thanhe has a long time. I reviewed discharge summary and agree with the note above.   Leonie Man, M.D., M.S. Interventional Cardiologist   Pager # [phone]  ADDENDUM: this patient was admitted for pre-cath hydration in the setting of CKD IV. He is not a candidate for ACE/ARB because these medications are contraindicated in the setting of advanced renal disease (CKD IV) for risk of acute kidney injury on chronic renal disease.  Sherren Mocha 04/30/2015 11:31 AM

## 2015-04-10 NOTE — Progress Notes (Signed)
CARDIAC REHAB PHASE I   PRE:  Rate/Rhythm: 70 paced  BP:  Sitting: 150/55        SaO2: 95 RA  MODE:  Ambulation: 1000 ft   POST:  Rate/Rhythm: 94 paced  BP:  Sitting: 178/64         SaO2: 95 RA  Pt ambulated 1000 ft on RA, handheld assist, steady gait, tolerated well.  Pt c/o of mild DOE, denies cp, dizziness, declined rest stop. Pt states "I didn't think it would be possible for me to walk that much ever again. I can't wait to be able to walk to the mailbox" Pt very pleased with walk. Completed PCI/stent education with pt wife at bedside.  Reviewed risk factors, anti-platelet therapy, stent card, activity restrictions, ntg, exercise, heart healthy diet, carb counting, portion control, and phase 2 cardiac rehab. Pt verbalized understanding. Pt agrees to phase 2 cardiac rehab. Will send referral to [facility].  Pt to recliner after walk, call bell within reach.  [phone]   Lenna Sciara, RN, BSN 04/10/2015 9:15 AM

## 2015-04-11 ENCOUNTER — Other Ambulatory Visit: Payer: Self-pay

## 2015-04-11 DIAGNOSIS — F32A Depression, unspecified: Secondary | ICD-10-CM

## 2015-04-11 DIAGNOSIS — F329 Major depressive disorder, single episode, unspecified: Secondary | ICD-10-CM

## 2015-04-11 MED ORDER — SERTRALINE HCL 100 MG PO TABS: 150.0000 mg | ORAL_TABLET | Freq: Every day | ORAL | Status: DC

## 2015-04-12 ENCOUNTER — Telehealth: Payer: Self-pay | Admitting: Cardiology

## 2015-04-12 NOTE — Telephone Encounter (Signed)
D/C phone call .appt on 04/17/15 at [place] w/ [employer] @ [address] .[name]  Thanks

## 2015-04-12 NOTE — Telephone Encounter (Signed)
Patient contacted regarding discharge from [facility] on 04-10-15  Patient understands to follow up with provider lori gerhardt np on 04-17-15 at 9:45am at [address] Patient understands discharge instructions? yes Patient understands medications and regiment? yes Patient understands to bring all medications to this visit? yes

## 2015-04-17 ENCOUNTER — Encounter: Payer: Self-pay | Admitting: Nurse Practitioner

## 2015-04-17 ENCOUNTER — Ambulatory Visit (INDEPENDENT_AMBULATORY_CARE_PROVIDER_SITE_OTHER): Payer: 59 | Admitting: Nurse Practitioner

## 2015-04-17 VITALS — BP 110/60 | HR 75 | Resp 18 | Ht 67.0 in | Wt 230.0 lb

## 2015-04-17 DIAGNOSIS — Z955 Presence of coronary angioplasty implant and graft: Secondary | ICD-10-CM | POA: Diagnosis not present

## 2015-04-17 LAB — CBC
HCT: 36.4 % — ABNORMAL LOW (ref 39.0–52.0)
Hemoglobin: 12.5 g/dL — ABNORMAL LOW (ref 13.0–17.0)
MCH: 30.9 pg (ref 26.0–34.0)
MCHC: 34.3 g/dL (ref 30.0–36.0)
MCV: 89.9 fL (ref 78.0–100.0)
MPV: 9.6 fL (ref 8.6–12.4)
Platelets: 255 10*3/uL (ref 150–400)
RBC: 4.05 MIL/uL — ABNORMAL LOW (ref 4.22–5.81)
RDW: 14.4 % (ref 11.5–15.5)
WBC: 9 10*3/uL (ref 4.0–10.5)

## 2015-04-17 LAB — BASIC METABOLIC PANEL
BUN: 35 mg/dL — ABNORMAL HIGH (ref 7–25)
CO2: 27 mmol/L (ref 20–31)
Calcium: 9 mg/dL (ref 8.6–10.3)
Chloride: 106 mmol/L (ref 98–110)
Creat: 2.22 mg/dL — ABNORMAL HIGH (ref 0.70–1.11)
Glucose, Bld: 128 mg/dL — ABNORMAL HIGH (ref 65–99)
Potassium: 4.5 mmol/L (ref 3.5–5.3)
Sodium: 137 mmol/L (ref 135–146)

## 2015-04-17 MED ORDER — FUROSEMIDE 40 MG PO TABS: 40.0000 mg | ORAL_TABLET | Freq: Every day | ORAL | Status: DC

## 2015-04-17 MED ORDER — HYDRALAZINE HCL 25 MG PO TABS: 37.5000 mg | ORAL_TABLET | Freq: Two times a day (BID) | ORAL | Status: DC

## 2015-04-17 NOTE — Patient Instructions (Addendum)
We will be checking the following labs today - BMET and CBC   Medication Instructions:    Continue with your current medicines. But I   Am increasing the Hydralazine to 37.5 mg twice a day - this is a pill and a half twice a day    Testing/Procedures To Be Arranged:  N/A  Follow-Up:   See Dr. [name] as planned in November.     Other Special Instructions:   N/A  Call the [place] office at [phone] if you have any questions, problems or concerns.

## 2015-04-17 NOTE — Progress Notes (Signed)
CARDIOLOGY OFFICE NOTE  Date:  04/17/2015    Rodney Farley Date of Birth: September 15, 1931 Medical Record [id number]  PCP:  [name], MD  Cardiologist:  [name]    Chief Complaint  Patient presents with  . Post cardiac cath/PCI    Seen for Dr. [name]    History of Present Illness: Rodney Farley is a 81 y.o. male who presents today for a post cath/PCI visit. Seen for Dr. [name].   He has a history of CAD (s/p MI in 1994/1995 while in [place] s/p questionable PCI, recent progression of disease), CHB 2010 s/p pacemaker, CKD stage IV, DM, HTN, HLD, remote tobacco abuse, COPD, moderate sleep apnea, and chronic systolic CHF.    He recently had progression of DOE, chest discomfort and chest pounding, limiting his level of exertion. 2D Echo 09/2014 showed EF 35-40% with +WMA, grade 1 DD, mild MR. PFTs looked remarkably good. Dr. [name] planned to do a [facility] and admitted him for overnight hydration but Cr increased to 2.44 and procedure was cancelled. He was then evaluated by renal with return in Cr to 1.8. He was then seen by urgent care and complained of swelling in his feet thus Lasix 40mg  daily was added with improvement. He was then admitted for a second attempt on 03/28/15 and Cr was 2.14 (stable compared to prior outpatient 2.15). LE duplex negative for DVT. Cath showed severe 3V CAD - the mid LAD, second diagonal, and third OM branches appeared suitable for PCI +/- distal LCx. The other option would be CABG. After discussion concerning risk and benefits he would prefer to be treated with PCI. He was hydrated post-cath and started on Plavix 75mg  daily. F/u BMET on 9/19 showed Cr of 2.46 thus Lasix was held x 2 days then resumed at 40mg  daily. F/u level showed BUN/Cr 35/2.26. Was treated with antibiotics for diverticulitis during this time.   Patient underwent left heart catheterization revealing Mid LAD lesion, 90% stenosed, 2nd Diag lesion, 90% stenosed, 3rd Mrg lesion, 90%  stenosed. He received drug-eluting stents to each lesion. Distal 70% circumflex lesion will be treated medically.He was started on aspirin and Plavix. Hydralazine was added for afterload reduction and better blood pressure control. This will likely need to be titrated at his office follow-up. Plan to recheck 2-D echocardiogram in 3 months.  Comes back today. Here alone. He is "in a state of euphoria". He is very happy with how he feels. No chest pain. Breathing is fine. No "longer puffing". To start rehab in about 2 weeks. Walking at home with no issue. BP was up initially at home but has now come down nicely. Not dizzy or lightheaded. No groin issue. Belly feels fine - finishing antibiotic today. Mild swelling in his feet. Overall, he has no complaint.   Past Medical History  Diagnosis Date  . COPD (chronic obstructive pulmonary disease) (HCC)     Severe  . Hypercholesterolemia   . Depression   . Coronary artery disease     a. s/p MI in 1994/1995 while in [place] s/p questionable PCI. 03/2015: progression of disease, for staged PCI.  [name] Hypertension   . Atrioventricular block, complete ([place])     a. 2010 s/p pacemaker.  . Pacemaker     medtronic  . Obesity   . TIA (transient ischemic attack) X 3  . Neuropathy (HCC)     IN LOWER EXTREMITIES  . GERD (gastroesophageal reflux disease)   . DOE (dyspnea on exertion)  03/28/2015  . CKD (chronic kidney disease), stage IV ([place])   . Type II diabetes mellitus ([place])   . Diabetic peripheral neuropathy ([place])   . Chronic systolic CHF (congestive heart failure) ([place])     a. LVEF 35-40% by echo 09/2014.  . MI (myocardial infarction) ([place]) 1994; 1995  . Anginal pain ([place])   . Sleep apnea     "sleeps w/humidifyer when he panics and gets short of breath" (04/08/2015)  . Anxiety     Past Surgical History  Procedure Laterality Date  . Tonsillectomy    . Cholecystectomy open  ~ 1977  . Insert / replace / remove pacemaker  07/2008    Complete heart block  status post DDD with good function  . Hiatal hernia repair  1977  . Cardiac catheterization N/A 03/29/2015    Procedure: Right/Left Heart Cath and Coronary Angiography;  Surgeon: Doc M [name], MD;  Location: [place] [facility];  Service: Cardiovascular;  Laterality: N/A;  . Cardiac catheterization  1995    "after my MI; put me on heart RX after cath"  . Cardiac catheterization N/A 04/09/2015    Procedure: Coronary Stent Intervention;  Surgeon: Miranda M [name], MD;  Location: [place] [facility];  Service: Cardiovascular;  Laterality: N/A;     Medications: Current Outpatient Prescriptions  Medication Sig Dispense Refill  . aspirin EC 81 MG tablet Take 81 mg by mouth daily.    [name] atorvastatin (LIPITOR) 40 MG tablet Take 1 tablet (40 mg total) by mouth daily. PATIENT NEEDS OFFICE VISIT FOR ADDITIONAL REFILLS 30 tablet 0  . carvedilol (COREG) 12.5 MG tablet Take 1 tablet (12.5 mg total) by mouth 2 (two) times daily with a meal. 60 tablet 6  . clopidogrel (PLAVIX) 75 MG tablet Take 1 tablet (75 mg total) by mouth daily with breakfast. 30 tablet 6  . hydrALAZINE (APRESOLINE) 25 MG tablet Take 1.5 tablets (37.5 mg total) by mouth 2 (two) times daily. 90 tablet 11  . Insulin Glargine (LANTUS SOLOSTAR) 100 UNIT/ML Solostar Pen Inject 60 Units into the skin daily at 10 pm. 5 pen PRN  . Insulin Pen Needle (B-D UF III MINI PEN NEEDLES) 31G X 5 MM MISC Use to test blood sugar 3 times daily. Dx code: 250.02 100 each 11  . isosorbide mononitrate (IMDUR) 30 MG 24 hr tablet Take 1 tablet (30 mg total) by mouth daily. 30 tablet 6  . nitroGLYCERIN (NITROSTAT) 0.4 MG SL tablet Place 1 tablet (0.4 mg total) under the tongue every 5 (five) minutes x 3 doses as needed for chest pain. 25 tablet 3  . pantoprazole (PROTONIX) 40 MG tablet Take 1 tablet (40 mg total) by mouth daily. 30 tablet 6  . sertraline (ZOLOFT) 100 MG tablet Take 1.5 tablets (150 mg total) by mouth daily. 135 tablet 1  . tamsulosin (FLOMAX) 0.4 MG  CAPS capsule TAKE 1 CAPSULE BY MOUTH DAILY. 30 capsule PRN  . terazosin (HYTRIN) 1 MG capsule TAKE 1 CAPSULE BY MOUTH DAILY AT BEDTIME 90 capsule 1  . tetrahydrozoline-zinc (VISINE-AC) 0.05-0.25 % ophthalmic solution Place 2 drops into both eyes as needed (dry eyes).    . vitamin B-12 (CYANOCOBALAMIN) 1000 MCG tablet Take 1,000 mcg by mouth daily.    . furosemide (LASIX) 40 MG tablet Take 1 tablet (40 mg total) by mouth daily. 90 tablet 3   No current facility-administered medications for this visit.    Allergies: Allergies  Allergen Reactions  . Bee Venom Anaphylaxis  .  Zocor [Simvastatin] Nausea Only and Other (See Comments)    Headache with brand name only.  Can take the generic.    Social History: The patient  reports that he quit smoking about 7 years ago. His smoking use included Cigarettes. He has a 81 pack-year smoking history. He has never used smokeless tobacco. He reports that he drinks alcohol. He reports that he does not use illicit drugs.   Family History: [name] family history includes Heart attack in his brother; Leukemia in his father; Stroke in his mother and sister.   Review of Systems: Please see the history of present illness.   Otherwise, the review of systems is positive for none.   All other systems are reviewed and negative.   Physical Exam: VS:  BP 110/60 mmHg  Pulse 75  Resp 18  Ht 5\' 7"  (1.702 m)  Wt 230 lb (104.327 kg)  BMI 36.01 kg/m2  SpO2 97% .  BMI Body mass index is 36.01 kg/(m^2).  Wt Readings from Last 3 Encounters:  04/17/15 230 lb (104.327 kg)  04/10/15 231 lb 7.7 oz (105 kg)  04/04/15 231 lb (104.781 kg)    General: Pleasant. Well developed, well nourished and in no acute distress.  HEENT: Normal. Neck: Supple, no JVD, carotid bruits, or masses noted.  Cardiac: Regular rate and rhythm. No murmurs, rubs, or gallops. No edema.  Respiratory:  Lungs are clear to auscultation bilaterally with normal work of breathing.  GI: Soft  and nontender.  MS: No deformity or atrophy. Gait and ROM intact. Skin: Warm and dry. Color is normal.  Neuro:  Strength and sensation are intact and no gross focal deficits noted.  Psych: Alert, appropriate and with normal affect.   LABORATORY DATA:  EKG:  EKG is not ordered today.  Lab Results  Component Value Date   WBC 8.4 04/10/2015   HGB 11.8* 04/10/2015   HCT 35.9* 04/10/2015   PLT 201 04/10/2015   GLUCOSE 82 04/10/2015   CHOL 215* 09/19/2014   TRIG 247* 09/19/2014   HDL 41 09/19/2014   LDLCALC 125* 09/19/2014   ALT 22 04/08/2015   AST 22 04/08/2015   NA 139 04/10/2015   K 4.0 04/10/2015   CL 105 04/10/2015   CREATININE 1.96* 04/10/2015   BUN 28* 04/10/2015   CO2 27 04/10/2015   TSH 1.606 03/28/2015   PSA 0.55 09/19/2014   INR 1.03 04/08/2015   HGBA1C 8.6* 03/28/2015   MICROALBUR 68.3* 09/19/2014    BNP (last 3 results) No results for input(s): BNP in the last 8760 hours.  ProBNP (last 3 results) No results for input(s): PROBNP in the last 8760 hours.   Other Studies Reviewed Today:  Echo Study Conclusions from 09/2014  - Left ventricle: The cavity size was mildly dilated. Wall thickness was normal. Systolic function was moderately reduced. The estimated ejection fraction was in the range of 35% to 40%. Mid anteroseptal, apical septal and true apex akinesis. Mid inferoseptal and apical lateral hypokinesis. Doppler parameters are consistent with abnormal left ventricular relaxation (grade 1 diastolic dysfunction). - Aortic valve: There was no stenosis. - Mitral valve: Mildly calcified annulus. There was mild regurgitation. - Left atrium: The atrium was mildly dilated. - Right ventricle: The cavity size was normal. Pacer wire or catheter noted in right ventricle. Systolic function was normal. - Tricuspid valve: Peak RV-RA gradient (S): 21 mm Hg. - Pulmonary arteries: PA peak pressure: 24 mm Hg (S). - Inferior vena cava: The vessel was  normal  in size. The respirophasic diameter changes were in the normal range (= 50%), consistent with normal central venous pressure.  Impressions:  - Mildly dilated LV with moderate systolic dysfunction, EF [id number]. Wall motion abnormalities as noted above. Normal RV size and systolic function. Mild MR.  Cardiac Cath/PCI Conclusion from 03/2015     Acute Mrg lesion, 100% stenosed.  Ost 1st Diag to 1st Diag lesion, 90% stenosed.  Dist Cx lesion, 70% stenosed.  2nd Diag lesion, 90% stenosed. There is a 0% residual stenosis post intervention.  A drug-eluting stent was placed.  Mid LAD lesion, 90% stenosed. There is a 0% residual stenosis post intervention.  A drug-eluting stent was placed.  3rd Mrg lesion, 90% stenosed. There is a 0% residual stenosis post intervention.  A drug-eluting stent was placed.  1. Successful stenting of the mid LAD, second diagonal, and OM3 with DES.  Plan: observe overnight with hydration. Assess renal function in AM. Plan to resume lasix at DC due to elevated EDP. DAPT for one year. Anticipate DC in am if no complications. Will need to reassess LV function in 3 months. If EF remains low consider upgrading pacemaker to a CRT/ICD device.      Assessment/Plan: 1. Chest pain/dyspnea in setting of known CAD - s/p  left heart catheterization revealing Mid LAD lesion, 90% stenosed, 2nd Diag lesion, 90% stenosed, 3rd Mrg lesion, 90% stenosed. He received drug-eluting stents to each lesion. Distal 70% circumflex lesion will be treated medically. He is on DAPT with Plavix and aspirin - which I am assuming will be long term. Recheck his labs today. See back as planned.   2. ICM - increasing his Hydralazine today. Needs repeat echo in 3 months and may need to consider CRT therapy  3. Chronic systolic HF - compensated - rechecking his labs today. He has a good understanding about weight daily and restricting salt.   4. HTN - BP by me is 120/70. Will  try to titrate his Hydralazine up a little. He will continue to monitor at home.   5. HLD - on statin therapy  Current medicines are reviewed with the patient today.  The patient does not have concerns regarding medicines other than what has been noted above.  The following changes have been made:  See above.  Labs/ tests ordered today include:    Orders Placed This Encounter  Procedures  . Basic metabolic panel  . CBC     Disposition:   FU with Dr. [name] as planned in November.   Patient is agreeable to this plan and will call if any problems develop in the interim.   Signed: Burtis Junes, RN, ANP-C 04/17/2015 9:27 AM  [facility] [address] [place], [place]  [address] Phone: [phone] Fax: [phone]

## 2015-04-19 ENCOUNTER — Ambulatory Visit (INDEPENDENT_AMBULATORY_CARE_PROVIDER_SITE_OTHER): Payer: 59 | Admitting: Internal Medicine

## 2015-04-19 ENCOUNTER — Encounter: Payer: Self-pay | Admitting: Internal Medicine

## 2015-04-19 VITALS — BP 128/66 | HR 72 | Temp 99.0°F | Resp 16 | Ht 67.0 in | Wt 230.0 lb

## 2015-04-19 DIAGNOSIS — I25110 Atherosclerotic heart disease of native coronary artery with unstable angina pectoris: Secondary | ICD-10-CM | POA: Diagnosis not present

## 2015-04-19 DIAGNOSIS — Z794 Long term (current) use of insulin: Secondary | ICD-10-CM

## 2015-04-19 DIAGNOSIS — I10 Essential (primary) hypertension: Secondary | ICD-10-CM | POA: Diagnosis not present

## 2015-04-19 DIAGNOSIS — E1165 Type 2 diabetes mellitus with hyperglycemia: Secondary | ICD-10-CM

## 2015-04-19 DIAGNOSIS — E1121 Type 2 diabetes mellitus with diabetic nephropathy: Secondary | ICD-10-CM

## 2015-04-19 DIAGNOSIS — F329 Major depressive disorder, single episode, unspecified: Secondary | ICD-10-CM | POA: Diagnosis not present

## 2015-04-19 DIAGNOSIS — F32A Depression, unspecified: Secondary | ICD-10-CM

## 2015-04-19 DIAGNOSIS — IMO0002 Reserved for concepts with insufficient information to code with codable children: Secondary | ICD-10-CM

## 2015-04-19 DIAGNOSIS — E669 Obesity, unspecified: Secondary | ICD-10-CM

## 2015-04-19 DIAGNOSIS — I5022 Chronic systolic (congestive) heart failure: Secondary | ICD-10-CM | POA: Diagnosis not present

## 2015-04-19 MED ORDER — ATORVASTATIN CALCIUM 40 MG PO TABS: 40.0000 mg | ORAL_TABLET | Freq: Every day | ORAL | Status: DC

## 2015-04-19 NOTE — Progress Notes (Signed)
Subjective:    Patient ID: Rodney Farley, male    DOB: Nov 20, 1931, 81 y.o.   MRN: [id number]  HPIf/u  Patient Active Problem List   Diagnosis Date Noted  . Chronic systolic CHF (congestive heart failure) ([place]) 04/08/2015  . Bilateral edema of lower extremity---better tho still some sw L foot/ankle w/out pain or red 03/29/2015  . DOE (dyspnea on exertion), due to significant CAD---he walked the last 100 yards to his home on way home from the hospital for the first time in years  03/28/2015  . CKD (chronic kidney disease) stage 4, GFR 15-29 ml/min (HCC) 03/04/2015  . Unstable angina (HCC)===resolved by PCI 10/16/2014  . CAD (coronary artery disease), native coronary artery, in all vessels, significant MI 94/[address] block/pacemaker 2010 Cath severe 3 vessel dz 9/16 so stents 9/26/16x3 Mid LAD lesion, 90% stenosed, 2nd Diag lesion, 90% stenosed, 3rd Mrg lesion, 90% stenosed. He received drug-eluting stents to each lesion. Distal 70% circumflex lesion will be treated medically.He was started on aspirin and Plavix. Hydralazine was added for afterload reduction and better blood pressure control. 09/13/2014  . Depression-better fam to live for//finances the big worry and he "dwells" feeling he should have prepared better--[employer]--money in pounds--family has to care for him PHQ 19 12/2014 08/05/2012  . Sleep apnea 08/05/2012  . Diabetes type 2, uncontrolled (HCC)---A1c see labs good enough for now Home am bs 170 highest <100 lowest 08/05/2012  . HTN (hypertension)--110/60 at card 10/5 Home bp vary but controlled 1/3time--hydral incr 04/17/15 08/05/2012  . Obesity--is aware and is trying     Current outpatient prescriptions:  .  aspirin EC 81 MG tablet, Take 81 mg by mouth daily., Disp: , Rfl:  .  atorvastatin (LIPITOR) 40 MG tablet, Take 1 tablet (40 mg total) by mouth daily. PATIENT NEEDS OFFICE VISIT FOR ADDITIONAL REFILLS, Disp: 30 tablet, Rfl: 0 .  carvedilol (COREG) 12.5 MG  tablet, Take 1 tablet (12.5 mg total) by mouth 2 (two) times daily with a meal., Disp: 60 tablet, Rfl: 6 .  clopidogrel (PLAVIX) 75 MG tablet, Take 1 tablet (75 mg total) by mouth daily with breakfast., Disp: 30 tablet, Rfl: 6 .  furosemide (LASIX) 40 MG tablet, Take 1 tablet (40 mg total) by mouth daily., Disp: 90 tablet, Rfl: 3 .  hydrALAZINE (APRESOLINE) 25 MG tablet, Take 1.5 tablets (37.5 mg total) by mouth 2 (two) times daily., Disp: 90 tablet, Rfl: 11 .  Insulin Glargine (LANTUS SOLOSTAR) 100 UNIT/ML Solostar Pen, Inject 60 Units into the skin daily at 10 pm., Disp: 5 pen, Rfl: PRN .  Insulin Pen Needle (B-D UF III MINI PEN NEEDLES) 31G X 5 MM MISC, Use to test blood sugar 3 times daily. Dx code: 30.02, Disp: 100 each, Rfl: 11 .  isosorbide mononitrate (IMDUR) 30 MG 24 hr tablet, Take 1 tablet (30 mg total) by mouth daily., Disp: 30 tablet, Rfl: 6 .  nitroGLYCERIN (NITROSTAT) 0.4 MG SL tablet, Place 1 tablet (0.4 mg total) under the tongue every 5 (five) minutes x 3 doses as needed for chest pain., Disp: 25 tablet, Rfl: 3 .  OVER THE COUNTER MEDICATION, OTC Bausch-Lomb eye drops taking, Disp: , Rfl:  .  pantoprazole (PROTONIX) 40 MG tablet, Take 1 tablet (40 mg total) by mouth daily., Disp: 30 tablet, Rfl: 6 .  sertraline (ZOLOFT) 100 MG tablet, Take 1.5 tablets (150 mg total) by mouth daily., Disp: 135 tablet, Rfl: 1 .  tamsulosin (FLOMAX) 0.4 MG CAPS capsule,  TAKE 1 CAPSULE BY MOUTH DAILY., Disp: 30 capsule, Rfl: PRN .  terazosin (HYTRIN) 1 MG capsule, TAKE 1 CAPSULE BY MOUTH DAILY AT BEDTIME, Disp: 90 capsule, Rfl: 1 .  vitamin B-12 (CYANOCOBALAMIN) 1000 MCG tablet, Take 1,000 mcg by mouth daily., Disp: , Rfl:   Quit smok 2009 zosta rx given 3/16 paper Episode divertic in hospital recently  Review of Systems No fever chills or night sweats No weight loss or weight gain No congestion or cough Eating well without constipation or diarrhea No urinary symptoms Edema which was bilateral  and extensive in August 2016 has resolved exception of mild swelling of the left foot at the end of the day. There is no pain or redness.   he is addressing OSA See PFTs 3/16 Objective:   Physical Exam BP 128/66 mmHg  Pulse 72  Temp(Src) 99 F (37.2 C) (Oral)  Resp 16  Ht 5\' 7"  (1.702 m)  Wt 230 lb (104.327 kg)  BMI 36.01 kg/m2  SpO2 98% Wt Readings from Last 3 Encounters:  04/19/15 230 lb (104.327 kg)  04/17/15 230 lb (104.327 kg)  04/10/15 231 lb 7.7 oz (105 kg)   230 pounds March 2016 HEENT-clear Ht regular! No rales L foot with pitting over dorsum but not ankle-good perip pulses //R leg no edema sens intac(see d foot exam) CN 2-12 PHQ 11 has fallen from 19 3 mos ago!!!!!!      Assessment & Plan:  Uncontrolled type 2 diabetes mellitus with diabetic nephropathy, with long-term current use of insulin ([place]) - Plan: HM Diabetes Foot Exam -A1c 8.6 prior to PCI/7.5 June 16--- this is good enough at this point with his morning blood sugars in a good enough range for now -expect improvement from activity and weight loss over the next 6 months -eye exam next year -Focus on avoiding any renal insult -Atorvastatin 40 refill  Essential hypertension -Coreg and hydralazine  Depression -Improving/continue Zoloft/continue the new search for meaning after surgery  Obesity  Coronary artery disease involving native coronary artery of native heart with unstable angina pectoris ([place]) -Wonderful result status post PCI 3  Chronic systolic CHF (congestive heart failure) ([place]) -Echo in 3 months  Follow-up here in December for labs and symptom recheck Cardiology follow-up in November

## 2015-04-24 ENCOUNTER — Encounter: Payer: Self-pay | Admitting: Cardiology

## 2015-04-30 ENCOUNTER — Encounter: Payer: Self-pay | Admitting: Internal Medicine

## 2015-04-30 DIAGNOSIS — E1122 Type 2 diabetes mellitus with diabetic chronic kidney disease: Secondary | ICD-10-CM

## 2015-04-30 DIAGNOSIS — Z794 Long term (current) use of insulin: Principal | ICD-10-CM

## 2015-04-30 DIAGNOSIS — E1165 Type 2 diabetes mellitus with hyperglycemia: Principal | ICD-10-CM

## 2015-04-30 DIAGNOSIS — IMO0002 Reserved for concepts with insufficient information to code with codable children: Secondary | ICD-10-CM

## 2015-04-30 DIAGNOSIS — N183 Chronic kidney disease, stage 3 (moderate): Principal | ICD-10-CM

## 2015-05-01 NOTE — Telephone Encounter (Signed)
Ref opth

## 2015-05-02 ENCOUNTER — Telehealth: Payer: Self-pay | Admitting: Cardiology

## 2015-05-02 NOTE — Telephone Encounter (Signed)
Received [employer] FMLA forms for Dr [name].  Forms were laid on my desk.  Forms for Dr [name] to complete and sign.  No Auth/pmt to [employer].  Sent to [employer] via Courier on 05/02/15 for letter/packet to be mailed out.  lp

## 2015-05-03 ENCOUNTER — Telehealth: Payer: Self-pay | Admitting: Cardiology

## 2015-05-03 NOTE — Telephone Encounter (Signed)
RN called cardiac rehab spoke to Thayer Headings - order is there.  patient will be called in the next couple days,  RN spoke to wife - information given. Wife states cardiac rehab has contacted patient today.

## 2015-05-03 NOTE — Telephone Encounter (Signed)
New message      Pt want doctor to know he has not heard from cardiac rehab.  It has been 3 weeks.  Please call and let him know if he should still go.  OK to talk to patient's wife.

## 2015-05-07 ENCOUNTER — Encounter (HOSPITAL_COMMUNITY): Payer: Self-pay | Admitting: *Deleted

## 2015-05-07 ENCOUNTER — Ambulatory Visit (INDEPENDENT_AMBULATORY_CARE_PROVIDER_SITE_OTHER): Payer: 59 | Admitting: *Deleted

## 2015-05-07 ENCOUNTER — Observation Stay (HOSPITAL_COMMUNITY)
Admission: EM | Admit: 2015-05-07 | Discharge: 2015-05-11 | Disposition: A | Discharge status: Home Health | Payer: 59 | Attending: Family Medicine | Admitting: Family Medicine

## 2015-05-07 ENCOUNTER — Telehealth: Payer: Self-pay | Admitting: Cardiology

## 2015-05-07 ENCOUNTER — Ambulatory Visit (INDEPENDENT_AMBULATORY_CARE_PROVIDER_SITE_OTHER): Payer: 59 | Admitting: Internal Medicine

## 2015-05-07 VITALS — BP 158/94 | HR 90 | Temp 98.9°F | Resp 17 | Ht 65.5 in | Wt 221.0 lb

## 2015-05-07 DIAGNOSIS — Z7982 Long term (current) use of aspirin: Secondary | ICD-10-CM | POA: Insufficient documentation

## 2015-05-07 DIAGNOSIS — M79641 Pain in right hand: Secondary | ICD-10-CM

## 2015-05-07 DIAGNOSIS — N400 Enlarged prostate without lower urinary tract symptoms: Secondary | ICD-10-CM | POA: Diagnosis not present

## 2015-05-07 DIAGNOSIS — M10042 Idiopathic gout, left hand: Principal | ICD-10-CM | POA: Insufficient documentation

## 2015-05-07 DIAGNOSIS — F329 Major depressive disorder, single episode, unspecified: Secondary | ICD-10-CM | POA: Insufficient documentation

## 2015-05-07 DIAGNOSIS — I252 Old myocardial infarction: Secondary | ICD-10-CM | POA: Diagnosis not present

## 2015-05-07 DIAGNOSIS — Z79899 Other long term (current) drug therapy: Secondary | ICD-10-CM | POA: Diagnosis not present

## 2015-05-07 DIAGNOSIS — I5022 Chronic systolic (congestive) heart failure: Secondary | ICD-10-CM | POA: Insufficient documentation

## 2015-05-07 DIAGNOSIS — Z9861 Coronary angioplasty status: Secondary | ICD-10-CM | POA: Diagnosis not present

## 2015-05-07 DIAGNOSIS — I25708 Atherosclerosis of coronary artery bypass graft(s), unspecified, with other forms of angina pectoris: Secondary | ICD-10-CM | POA: Diagnosis present

## 2015-05-07 DIAGNOSIS — I200 Unstable angina: Secondary | ICD-10-CM

## 2015-05-07 DIAGNOSIS — Z992 Dependence on renal dialysis: Secondary | ICD-10-CM | POA: Diagnosis present

## 2015-05-07 DIAGNOSIS — J449 Chronic obstructive pulmonary disease, unspecified: Secondary | ICD-10-CM | POA: Insufficient documentation

## 2015-05-07 DIAGNOSIS — M79602 Pain in left arm: Secondary | ICD-10-CM

## 2015-05-07 DIAGNOSIS — N179 Acute kidney failure, unspecified: Secondary | ICD-10-CM | POA: Insufficient documentation

## 2015-05-07 DIAGNOSIS — I25119 Atherosclerotic heart disease of native coronary artery with unspecified angina pectoris: Secondary | ICD-10-CM | POA: Diagnosis present

## 2015-05-07 DIAGNOSIS — E119 Type 2 diabetes mellitus without complications: Secondary | ICD-10-CM | POA: Diagnosis present

## 2015-05-07 DIAGNOSIS — K219 Gastro-esophageal reflux disease without esophagitis: Secondary | ICD-10-CM | POA: Insufficient documentation

## 2015-05-07 DIAGNOSIS — E669 Obesity, unspecified: Secondary | ICD-10-CM | POA: Diagnosis present

## 2015-05-07 DIAGNOSIS — R609 Edema, unspecified: Secondary | ICD-10-CM

## 2015-05-07 DIAGNOSIS — Z794 Long term (current) use of insulin: Secondary | ICD-10-CM | POA: Diagnosis not present

## 2015-05-07 DIAGNOSIS — E1122 Type 2 diabetes mellitus with diabetic chronic kidney disease: Secondary | ICD-10-CM | POA: Diagnosis not present

## 2015-05-07 DIAGNOSIS — I2510 Atherosclerotic heart disease of native coronary artery without angina pectoris: Secondary | ICD-10-CM | POA: Insufficient documentation

## 2015-05-07 DIAGNOSIS — E1159 Type 2 diabetes mellitus with other circulatory complications: Secondary | ICD-10-CM | POA: Diagnosis present

## 2015-05-07 DIAGNOSIS — N184 Chronic kidney disease, stage 4 (severe): Secondary | ICD-10-CM | POA: Diagnosis not present

## 2015-05-07 DIAGNOSIS — I152 Hypertension secondary to endocrine disorders: Secondary | ICD-10-CM | POA: Diagnosis present

## 2015-05-07 DIAGNOSIS — Z950 Presence of cardiac pacemaker: Secondary | ICD-10-CM | POA: Diagnosis not present

## 2015-05-07 DIAGNOSIS — I10 Essential (primary) hypertension: Secondary | ICD-10-CM | POA: Diagnosis present

## 2015-05-07 DIAGNOSIS — Z87891 Personal history of nicotine dependence: Secondary | ICD-10-CM | POA: Insufficient documentation

## 2015-05-07 DIAGNOSIS — M7989 Other specified soft tissue disorders: Secondary | ICD-10-CM

## 2015-05-07 DIAGNOSIS — R531 Weakness: Secondary | ICD-10-CM | POA: Diagnosis not present

## 2015-05-07 DIAGNOSIS — I129 Hypertensive chronic kidney disease with stage 1 through stage 4 chronic kidney disease, or unspecified chronic kidney disease: Secondary | ICD-10-CM | POA: Diagnosis not present

## 2015-05-07 DIAGNOSIS — I442 Atrioventricular block, complete: Secondary | ICD-10-CM | POA: Diagnosis not present

## 2015-05-07 DIAGNOSIS — Z8673 Personal history of transient ischemic attack (TIA), and cerebral infarction without residual deficits: Secondary | ICD-10-CM | POA: Diagnosis not present

## 2015-05-07 DIAGNOSIS — E1142 Type 2 diabetes mellitus with diabetic polyneuropathy: Secondary | ICD-10-CM | POA: Insufficient documentation

## 2015-05-07 DIAGNOSIS — R200 Anesthesia of skin: Secondary | ICD-10-CM

## 2015-05-07 DIAGNOSIS — N186 End stage renal disease: Secondary | ICD-10-CM | POA: Diagnosis present

## 2015-05-07 DIAGNOSIS — Z7902 Long term (current) use of antithrombotics/antiplatelets: Secondary | ICD-10-CM | POA: Insufficient documentation

## 2015-05-07 DIAGNOSIS — R262 Difficulty in walking, not elsewhere classified: Secondary | ICD-10-CM | POA: Diagnosis not present

## 2015-05-07 DIAGNOSIS — M79642 Pain in left hand: Secondary | ICD-10-CM | POA: Diagnosis present

## 2015-05-07 DIAGNOSIS — E1165 Type 2 diabetes mellitus with hyperglycemia: Secondary | ICD-10-CM | POA: Diagnosis not present

## 2015-05-07 HISTORY — DX: Pain in arm, unspecified: M79.603

## 2015-05-07 LAB — CBC WITH DIFFERENTIAL/PLATELET
Basophils Absolute: 0 10*3/uL (ref 0.0–0.1)
Basophils Relative: 0 %
EOS PCT: 8 %
Eosinophils Absolute: 0.9 10*3/uL — ABNORMAL HIGH (ref 0.0–0.7)
HEMATOCRIT: 38.7 % — AB (ref 39.0–52.0)
HEMOGLOBIN: 13 g/dL (ref 13.0–17.0)
LYMPHS PCT: 12 %
Lymphs Abs: 1.5 10*3/uL (ref 0.7–4.0)
MCH: 30.8 pg (ref 26.0–34.0)
MCHC: 33.6 g/dL (ref 30.0–36.0)
MCV: 91.7 fL (ref 78.0–100.0)
MONO ABS: 1 10*3/uL (ref 0.1–1.0)
Monocytes Relative: 8 %
Neutro Abs: 8.8 10*3/uL — ABNORMAL HIGH (ref 1.7–7.7)
Neutrophils Relative %: 72 %
Platelets: 195 10*3/uL (ref 150–400)
RBC: 4.22 MIL/uL (ref 4.22–5.81)
RDW: 14 % (ref 11.5–15.5)
WBC: 12.2 10*3/uL — AB (ref 4.0–10.5)

## 2015-05-07 LAB — COMPREHENSIVE METABOLIC PANEL
ALBUMIN: 3.7 g/dL (ref 3.5–5.0)
ALK PHOS: 102 U/L (ref 38–126)
ALT: 29 U/L (ref 17–63)
ANION GAP: 10 (ref 5–15)
AST: 25 U/L (ref 15–41)
BILIRUBIN TOTAL: 0.7 mg/dL (ref 0.3–1.2)
BUN: 34 mg/dL — AB (ref 6–20)
CO2: 24 mmol/L (ref 22–32)
Calcium: 9.5 mg/dL (ref 8.9–10.3)
Chloride: 103 mmol/L (ref 101–111)
Creatinine, Ser: 2.29 mg/dL — ABNORMAL HIGH (ref 0.61–1.24)
GFR calc non Af Amer: 25 mL/min — ABNORMAL LOW (ref >60)
GFR, EST AFRICAN AMERICAN: 29 mL/min — AB (ref >60)
Glucose, Bld: 100 mg/dL — ABNORMAL HIGH (ref 65–99)
Potassium: 4.5 mmol/L (ref 3.5–5.1)
Sodium: 137 mmol/L (ref 135–145)
TOTAL PROTEIN: 6.8 g/dL (ref 6.5–8.1)

## 2015-05-07 NOTE — Telephone Encounter (Signed)
Pt said he was told by [employer] to notify Dr Ellyn Hack that he was filing for intermittent FMLA.

## 2015-05-07 NOTE — Telephone Encounter (Signed)
Returned call to patient's wife no answer.Left message on personal voice mail spoke to Emerson in medical records and she did receive FMLA paperwork.She advised you will need to come to office to pay a fee to [facility] for $25 and sign a release before form can be completed.

## 2015-05-07 NOTE — Telephone Encounter (Signed)
Returned call to patient's wife no answer.LMTC. 

## 2015-05-07 NOTE — ED Notes (Signed)
Patientt presents he went to see his MD earlier and wanted him sent here for evaluation.  Unable to grip with left hand and arm is difficult to move.  His MD is questioning DVT

## 2015-05-07 NOTE — Progress Notes (Signed)
Remote pacemaker transmission.   

## 2015-05-07 NOTE — Progress Notes (Addendum)
Subjective:    Patient ID: Rodney Farley, male    DOB: [birth year], 81 y.o.   MRN: [id number] This chart was scribed for Rodney Lin, MD by Zola Button, Medical Scribe. This patient was seen in Room 7 and the patient's care was started at 8:01 PM.   HPI HPI Comments: KIERNAN Farley is a 81 y.o. male with a history of CAD (with stents), atrioventricular block, ischemic cardiomyopathy, hypertension, CKD, DM, unstable angina, and chronic systolic CHF who presents to the [facility] complaining of sudden onset, sharp, worsening left arm pain radiating to his hand that started yesterday, but worsened as the day went on today. The pain is rated a 7-8/10 in severity. Patient reports having associated loss of appetite and numbness in his left arm. He has tried heat for the pain and has also been laying in bed for most of today. He has not taken any OTC medications for the pain. The pain is worse with palpation and movement. He has difficulty performing tasks such as putting on the gown due to the pain. He also reports having right-sided neck pain. He can't pick up things.  Patient denies fever, SOB, nausea, leg pain and difficulty ambulating. He also denies injury. No history of gout per patient. His blood sugar was 149 this morning. He notes his hydralazine dose was increased about 1.5 weeks ago to tid and Bp still has not been controlled..  Stents x3 per Dr [name] 9/27--had been doing very well until yesterday. Review of Systems  Constitutional: Negative for fever and chills.  Respiratory: Negative for shortness of breath.   Cardiovascular: Negative for chest pain, palpitations and leg swelling.  Gastrointestinal: Negative for abdominal pain.  Musculoskeletal:       Pain in post cerv area with ROM---no radiation to arm   Neurological: Negative for speech difficulty, light-headedness and headaches.  Psychiatric/Behavioral: Negative for confusion.       Objective:   Physical Exam  Constitutional: He is oriented to person, place, and time. He appears well-developed.  He is obviously acutely uncomfortable trying to hold his left arm very still  Eyes:  PERRLA with EOMs conjugate  Neck:  Range of motion of the neck produces pain in the right trapezius area without radiation toward the left  Cardiovascular:  Heart regular rate 75 without murmur  no carotid bruits  Pulmonary/Chest: Effort normal.  Lungs clear to auscultation  Abdominal:  Abdomen nontender  Musculoskeletal:  There is no swelling of the left arm compared to the right but he is exquisitely tender to any pressure or movement of his thumb and distal forearm. Pulses are good. Sensation is diminished and he is unable to hold anything with finger thumb opposition. The elbow and shoulder appear Normal to my exam. No erythema. No joint swelling in the hand. There is no triggering of the thumb  Neurological: He is alert and oriented to person, place, and time. No cranial nerve deficit.  Skin: Skin is warm and dry.  Nursing note and vitals reviewed. BP 158/94 mmHg   Pulse 90   Temp(Src) 98.9 F (37.2 C) (Oral)   Resp 17   Ht 5' 5.5" (1.664 m)   Wt 221 lb (100.245 kg)   BMI 36.20 kg/m2   SpO2 97%      Assessment & Plan:  Problem #1 severe left arm pain, progressively worse over 24 hours with no sign of musculoskeletal etiology or infectious etiology and needing further evaluation to rule  out deep vein thrombosis(or another etiology) following his recent cardiac procedures. He has sensory loss, some motor dysfunction, and severe pain. He will be sent to the emergency room for further evaluation. His wife of 27 years ( a Furniture conservator/restorer) is with him and prefers taking him rather than using EMS   I have completed the patient encounter in its entirety as documented by the scribe, with editing by me where necessary. Rodney Farley Pastor, M.D.  By signing my name below, I, Zola Button, attest that this  documentation has been prepared under the direction and in the presence of Rodney Lin, MD.  Electronically Signed: Zola Button, Medical Scribe. 05/07/2015. 8:01 PM.

## 2015-05-07 NOTE — ED Notes (Signed)
Rodney Farley [id number] would like to be notified of patient status. Admission/discharge

## 2015-05-07 NOTE — Telephone Encounter (Signed)
Patient didn't need nurse , she needed Med Records .[name] Closed encounter

## 2015-05-08 ENCOUNTER — Observation Stay (HOSPITAL_BASED_OUTPATIENT_CLINIC_OR_DEPARTMENT_OTHER): Payer: 59

## 2015-05-08 ENCOUNTER — Observation Stay (HOSPITAL_COMMUNITY): Payer: 59

## 2015-05-08 ENCOUNTER — Inpatient Hospital Stay (HOSPITAL_COMMUNITY): Payer: 59

## 2015-05-08 ENCOUNTER — Encounter (HOSPITAL_COMMUNITY): Payer: Self-pay | Admitting: Family Medicine

## 2015-05-08 ENCOUNTER — Emergency Department (HOSPITAL_COMMUNITY): Payer: 59

## 2015-05-08 DIAGNOSIS — N184 Chronic kidney disease, stage 4 (severe): Secondary | ICD-10-CM

## 2015-05-08 DIAGNOSIS — I25110 Atherosclerotic heart disease of native coronary artery with unstable angina pectoris: Secondary | ICD-10-CM

## 2015-05-08 DIAGNOSIS — Z794 Long term (current) use of insulin: Secondary | ICD-10-CM

## 2015-05-08 DIAGNOSIS — G5692 Unspecified mononeuropathy of left upper limb: Secondary | ICD-10-CM

## 2015-05-08 DIAGNOSIS — E1122 Type 2 diabetes mellitus with diabetic chronic kidney disease: Secondary | ICD-10-CM

## 2015-05-08 DIAGNOSIS — M79603 Pain in arm, unspecified: Secondary | ICD-10-CM

## 2015-05-08 DIAGNOSIS — R2231 Localized swelling, mass and lump, right upper limb: Secondary | ICD-10-CM | POA: Diagnosis not present

## 2015-05-08 DIAGNOSIS — I5022 Chronic systolic (congestive) heart failure: Secondary | ICD-10-CM | POA: Diagnosis not present

## 2015-05-08 DIAGNOSIS — I442 Atrioventricular block, complete: Secondary | ICD-10-CM | POA: Diagnosis not present

## 2015-05-08 DIAGNOSIS — E1165 Type 2 diabetes mellitus with hyperglycemia: Secondary | ICD-10-CM

## 2015-05-08 DIAGNOSIS — M7989 Other specified soft tissue disorders: Secondary | ICD-10-CM

## 2015-05-08 DIAGNOSIS — M79642 Pain in left hand: Secondary | ICD-10-CM | POA: Diagnosis present

## 2015-05-08 DIAGNOSIS — E669 Obesity, unspecified: Secondary | ICD-10-CM

## 2015-05-08 DIAGNOSIS — M79602 Pain in left arm: Secondary | ICD-10-CM | POA: Diagnosis present

## 2015-05-08 DIAGNOSIS — Z950 Presence of cardiac pacemaker: Secondary | ICD-10-CM

## 2015-05-08 DIAGNOSIS — N183 Chronic kidney disease, stage 3 (moderate): Secondary | ICD-10-CM

## 2015-05-08 DIAGNOSIS — I10 Essential (primary) hypertension: Secondary | ICD-10-CM

## 2015-05-08 HISTORY — DX: Pain in arm, unspecified: M79.603

## 2015-05-08 LAB — CK: Total CK: 95 U/L (ref 49–397)

## 2015-05-08 LAB — GLUCOSE, CAPILLARY
GLUCOSE-CAPILLARY: 93 mg/dL (ref 65–99)
GLUCOSE-CAPILLARY: 141 mg/dL — AB (ref 65–99)
GLUCOSE-CAPILLARY: 145 mg/dL — AB (ref 65–99)
Glucose-Capillary: 117 mg/dL — ABNORMAL HIGH (ref 65–99)

## 2015-05-08 LAB — APTT: APTT: 29 s (ref 24–37)

## 2015-05-08 LAB — PROTIME-INR
INR: 1.09 (ref 0.00–1.49)
PROTHROMBIN TIME: 14.3 s (ref 11.6–15.2)

## 2015-05-08 LAB — SEDIMENTATION RATE: SED RATE: 40 mm/h — AB (ref 0–16)

## 2015-05-08 LAB — URIC ACID: Uric Acid, Serum: 7.4 mg/dL (ref 4.4–7.6)

## 2015-05-08 MED ORDER — MORPHINE SULFATE (PF) 4 MG/ML IV SOLN
4.0000 mg | Freq: Once | INTRAVENOUS | Status: AC
  Administered 2015-05-08: 4 mg via INTRAVENOUS
  Filled 2015-05-08: qty 1

## 2015-05-08 MED ORDER — ATORVASTATIN CALCIUM 40 MG PO TABS
40.0000 mg | ORAL_TABLET | Freq: Every day | ORAL | Status: DC
  Administered 2015-05-08 – 2015-05-11 (×4): 40 mg via ORAL
  Filled 2015-05-08 (×4): qty 1

## 2015-05-08 MED ORDER — ISOSORBIDE MONONITRATE ER 30 MG PO TB24
30.0000 mg | ORAL_TABLET | Freq: Every day | ORAL | Status: DC
  Administered 2015-05-08 – 2015-05-11 (×4): 30 mg via ORAL
  Filled 2015-05-08 (×4): qty 1

## 2015-05-08 MED ORDER — ASPIRIN EC 81 MG PO TBEC
81.0000 mg | DELAYED_RELEASE_TABLET | Freq: Every day | ORAL | Status: DC
  Administered 2015-05-08 – 2015-05-11 (×4): 81 mg via ORAL
  Filled 2015-05-08 (×4): qty 1

## 2015-05-08 MED ORDER — TERAZOSIN HCL 1 MG PO CAPS
1.0000 mg | ORAL_CAPSULE | Freq: Every day | ORAL | Status: DC
  Administered 2015-05-08 – 2015-05-10 (×3): 1 mg via ORAL
  Filled 2015-05-08 (×4): qty 1

## 2015-05-08 MED ORDER — FUROSEMIDE 40 MG PO TABS
40.0000 mg | ORAL_TABLET | Freq: Every day | ORAL | Status: DC
  Administered 2015-05-08 – 2015-05-09 (×2): 40 mg via ORAL
  Filled 2015-05-08 (×2): qty 1

## 2015-05-08 MED ORDER — HEPARIN SODIUM (PORCINE) 5000 UNIT/ML IJ SOLN
4000.0000 [IU] | Freq: Once | INTRAMUSCULAR | Status: AC
  Administered 2015-05-08: 4000 [IU] via INTRAVENOUS

## 2015-05-08 MED ORDER — CARVEDILOL 12.5 MG PO TABS
12.5000 mg | ORAL_TABLET | Freq: Two times a day (BID) | ORAL | Status: DC
  Administered 2015-05-08 – 2015-05-11 (×8): 12.5 mg via ORAL
  Filled 2015-05-08 (×8): qty 1

## 2015-05-08 MED ORDER — PANTOPRAZOLE SODIUM 40 MG PO TBEC
40.0000 mg | DELAYED_RELEASE_TABLET | Freq: Every day | ORAL | Status: DC
  Administered 2015-05-08 – 2015-05-11 (×4): 40 mg via ORAL
  Filled 2015-05-08 (×4): qty 1

## 2015-05-08 MED ORDER — HEPARIN (PORCINE) IN NACL 100-0.45 UNIT/ML-% IJ SOLN
1000.0000 [IU]/h | INTRAMUSCULAR | Status: DC
  Administered 2015-05-08: 1000 [IU]/h via INTRAVENOUS
  Filled 2015-05-08: qty 250

## 2015-05-08 MED ORDER — HYDRALAZINE HCL 25 MG PO TABS
37.5000 mg | ORAL_TABLET | Freq: Two times a day (BID) | ORAL | Status: DC
  Administered 2015-05-08 – 2015-05-11 (×7): 37.5 mg via ORAL
  Filled 2015-05-08 (×7): qty 2

## 2015-05-08 MED ORDER — TAMSULOSIN HCL 0.4 MG PO CAPS
0.4000 mg | ORAL_CAPSULE | Freq: Every day | ORAL | Status: DC
  Administered 2015-05-08 – 2015-05-11 (×4): 0.4 mg via ORAL
  Filled 2015-05-08 (×4): qty 1

## 2015-05-08 MED ORDER — INSULIN GLARGINE 100 UNIT/ML ~~LOC~~ SOLN
60.0000 [IU] | Freq: Every day | SUBCUTANEOUS | Status: DC
  Administered 2015-05-08 – 2015-05-10 (×3): 60 [IU] via SUBCUTANEOUS
  Filled 2015-05-08 (×4): qty 0.6

## 2015-05-08 MED ORDER — SERTRALINE HCL 50 MG PO TABS
150.0000 mg | ORAL_TABLET | Freq: Every day | ORAL | Status: DC
  Administered 2015-05-08 – 2015-05-11 (×4): 150 mg via ORAL
  Filled 2015-05-08 (×8): qty 1

## 2015-05-08 MED ORDER — OXYCODONE-ACETAMINOPHEN 5-325 MG PO TABS
1.0000 | ORAL_TABLET | Freq: Four times a day (QID) | ORAL | Status: DC | PRN
  Administered 2015-05-08 (×2): 2 via ORAL
  Filled 2015-05-08 (×2): qty 2

## 2015-05-08 MED ORDER — CLOPIDOGREL BISULFATE 75 MG PO TABS
75.0000 mg | ORAL_TABLET | Freq: Every day | ORAL | Status: DC
  Administered 2015-05-08 – 2015-05-11 (×4): 75 mg via ORAL
  Filled 2015-05-08 (×4): qty 1

## 2015-05-08 MED ORDER — SODIUM CHLORIDE 0.9 % IV BOLUS (SEPSIS)
500.0000 mL | Freq: Once | INTRAVENOUS | Status: AC
  Administered 2015-05-08: 500 mL via INTRAVENOUS

## 2015-05-08 MED ORDER — OXYCODONE-ACETAMINOPHEN 5-325 MG PO TABS
2.0000 | ORAL_TABLET | Freq: Once | ORAL | Status: AC
  Administered 2015-05-08: 2 via ORAL
  Filled 2015-05-08: qty 2

## 2015-05-08 NOTE — ED Notes (Signed)
Original admission order placed as med surg

## 2015-05-08 NOTE — Progress Notes (Addendum)
Patient admitted after midnight, please see H&P.  Patient now c/o b/l right and left pain.  No inciting incidents.  Duplex negative.  Heparin d/c'd Pain at base of thumb No b/l pain Will try CT cervical  percocet for pain  Eulogio Bear DO

## 2015-05-08 NOTE — ED Provider Notes (Signed)
CSN: [id number]     Arrival date & time 05/07/15  2113 History  By signing my name below, I, Meriel Pica, attest that this documentation has been prepared under the direction and in the presence of Julianne Rice, MD. Electronically Signed: Meriel Pica, ED Scribe. 05/08/2015. 12:49 AM.   Chief Complaint  Patient presents with  . Arm Pain   The history is provided by the patient. No language interpreter was used.   HPI Comments: BAUDILIO GAVRILOV is a 81 y.o. male, with a significant PMhx, who presents to the Emergency Department complaining of sudden onset, constant, progressively worsening left forearm pain onset 1 day ago with pain more predominantly in left hand. Pt has associated decreased grip strength and ROM in left hand and arm. He also associates more recent onset of pallor in left hand and possible mild swelling to his left hand. He was advised to present to the ED by his PCP for concern of DVT. His pain is worse with palpation or ROM of left arm. Pt is right-hand dominant. He endorses night sweats at baseline but denies any worsening or new diaphoresis. Also has left foot edema at baseline. He denies fevers or chills, abdominal pain, or worsening BLE edema.   Past Medical History  Diagnosis Date  . COPD (chronic obstructive pulmonary disease) (HCC)     Severe  . Hypercholesterolemia   . Depression   . Coronary artery disease     a. s/p MI in 1994/1995 while in [place] s/p questionable PCI. 03/2015: progression of disease, for staged PCI.  [name] Hypertension   . Atrioventricular block, complete ([place])     a. 2010 s/p pacemaker.  . Pacemaker     medtronic  . Obesity   . TIA (transient ischemic attack) X 3  . Neuropathy (HCC)     IN LOWER EXTREMITIES  . GERD (gastroesophageal reflux disease)   . DOE (dyspnea on exertion) 03/28/2015  . CKD (chronic kidney disease), stage IV ([place])   . Type II diabetes mellitus ([place])   . Diabetic peripheral neuropathy ([place])   . Chronic  systolic CHF (congestive heart failure) ([place])     a. LVEF 35-40% by echo 09/2014.  . MI (myocardial infarction) ([place]) 1994; 1995  . Anginal pain ([place])   . Sleep apnea     "sleeps w/humidifyer when he panics and gets short of breath" (04/08/2015)  . Anxiety    Past Surgical History  Procedure Laterality Date  . Tonsillectomy    . Cholecystectomy open  ~ 1977  . Insert / replace / remove pacemaker  07/2008    Complete heart block status post DDD with good function  . Hiatal hernia repair  1977  . Cardiac catheterization N/A 03/29/2015    Procedure: Right/Left Heart Cath and Coronary Angiography;  Surgeon: Maisen M [name], MD;  Location: [facility];  Service: Cardiovascular;  Laterality: N/A;  . Cardiac catheterization  1995    "after my MI; put me on heart RX after cath"  . Cardiac catheterization N/A 04/09/2015    Procedure: Coronary Stent Intervention;  Surgeon: Alekai M [name], MD;  Location: [facility];  Service: Cardiovascular;  Laterality: N/A;   Family History  Problem Relation Age of Onset  . Stroke Mother   . Leukemia Father   . Stroke Sister   . Heart attack Brother    Social History  Substance Use Topics  . Smoking status: Former Smoker -- 1.50 packs/day for 54 years  Types: Cigarettes    Quit date: 07/18/2007  . Smokeless tobacco: Never Used  . Alcohol Use: Yes     Comment: 04/08/2015 "probably 3 drinks/month"    Review of Systems  Constitutional: Negative for fever, chills and diaphoresis.  Respiratory: Negative for shortness of breath.   Cardiovascular: Negative for chest pain, palpitations and leg swelling.  Gastrointestinal: Negative for nausea, vomiting and abdominal pain.  Musculoskeletal: Positive for myalgias and arthralgias.  Skin: Positive for color change and pallor. Negative for rash and wound.  Neurological: Positive for weakness. Negative for dizziness, light-headedness, numbness and headaches.  All other systems reviewed and are  negative.  Allergies  Bee venom and Zocor  Home Medications   Prior to Admission medications   Medication Sig Start Date End Date Taking? Authorizing Provider  aspirin EC 81 MG tablet Take 81 mg by mouth daily.   Yes Historical Provider, MD  atorvastatin (LIPITOR) 40 MG tablet Take 1 tablet (40 mg total) by mouth daily. 04/19/15  Yes [name], MD  carvedilol (COREG) 12.5 MG tablet Take 1 tablet (12.5 mg total) by mouth 2 (two) times daily with a meal. 10/17/14  Yes Rogelia Mire, NP  clopidogrel (PLAVIX) 75 MG tablet Take 1 tablet (75 mg total) by mouth daily with breakfast. 03/29/15  Yes Isaiah Serge, NP  furosemide (LASIX) 40 MG tablet Take 1 tablet (40 mg total) by mouth daily. 04/17/15  Yes Burtis Junes, NP  hydrALAZINE (APRESOLINE) 25 MG tablet Take 1.5 tablets (37.5 mg total) by mouth 2 (two) times daily. Patient taking differently: Take 37.5 mg by mouth 3 (three) times daily.  04/17/15  Yes Burtis Junes, NP  Insulin Glargine (LANTUS SOLOSTAR) 100 UNIT/ML Solostar Pen Inject 60 Units into the skin daily at 10 pm. 01/09/14  Yes [name], MD  isosorbide mononitrate (IMDUR) 30 MG 24 hr tablet Take 1 tablet (30 mg total) by mouth daily. 03/29/15  Yes Isaiah Serge, NP  OVER THE COUNTER MEDICATION Place 1 drop into both eyes 3 (three) times daily. OTC B[place]ch-Lomb eye bath   Yes Historical Provider, MD  pantoprazole (PROTONIX) 40 MG tablet Take 1 tablet (40 mg total) by mouth daily. 03/29/15  Yes Isaiah Serge, NP  sertraline (ZOLOFT) 100 MG tablet Take 1.5 tablets (150 mg total) by mouth daily. 04/11/15  Yes [name], MD  tamsulosin (FLOMAX) 0.4 MG CAPS capsule TAKE 1 CAPSULE BY MOUTH DAILY. 01/30/15  Yes Tishira R Brewington, PA-C  terazosin (HYTRIN) 1 MG capsule TAKE 1 CAPSULE BY MOUTH DAILY AT BEDTIME 10/16/14  Yes [name], MD  vitamin B-12 (CYANOCOBALAMIN) 1000 MCG tablet Take 1,000 mcg by mouth daily.   Yes Historical Provider, MD  Insulin Pen  Needle (B-D UF III MINI PEN NEEDLES) 31G X 5 MM MISC Use to test blood sugar 3 times daily. Dx code: 250.02 Patient not taking: Reported on 05/08/2015 10/03/12   [name], MD  nitroGLYCERIN (NITROSTAT) 0.4 MG SL tablet Place 1 tablet (0.4 mg total) under the tongue every 5 (five) minutes x 3 doses as needed for chest pain. 10/17/14   Rogelia Mire, NP   BP 162/65 mmHg  Pulse 66  Temp(Src) 99.6 F (37.6 C) (Oral)  Resp 18  Ht 5\' 7"  (1.702 m)  Wt 221 lb (100.245 kg)  BMI 34.61 kg/m2  SpO2 96% Physical Exam  Constitutional: He is oriented to person, place, and time. He appears well-developed and well-nourished. No distress.  HENT:  Head: Normocephalic and atraumatic.  Mouth/Throat: Oropharynx is clear and moist. No oropharyngeal exudate.  Eyes: EOM are normal. Pupils are equal, round, and reactive to light.  Neck: Normal range of motion. Neck supple.  Cardiovascular: Normal rate and regular rhythm.   Pulmonary/Chest: Effort normal and breath sounds normal. No respiratory distress. He has no wheezes. He has no rales. He exhibits no tenderness.  Abdominal: Soft. Bowel sounds are normal. He exhibits no distension and no mass. There is no tenderness. There is no rebound and no guarding.  Musculoskeletal: He exhibits tenderness. He exhibits no edema.  Patient left upper extremity is diffusely swollen and pale compared to the right. 2+ radial and ulnar pulses on the left. Patient with tenderness to palpation over the thenar and hypothenar eminence with contraction of the left hand. Patient also has tenderness to palpation over the forearm. Good cap refill. Arm is warm and equal compared to right  Neurological: He is alert and oriented to person, place, and time.  Skin: Skin is warm and dry. No rash noted. No erythema.  Psychiatric: He has a normal mood and affect. His behavior is normal.  Nursing note and vitals reviewed.   ED Course  Procedures  DIAGNOSTIC STUDIES: Oxygen  Saturation is 96% on RA, normal by my interpretation.    COORDINATION OF CARE: 12:48 AM Discussed treatment plan with pt at bedside and pt agreed to plan.   Labs Review Labs Reviewed  CBC WITH DIFFERENTIAL/PLATELET - Abnormal; Notable for the following:    WBC 12.2 (*)    HCT 38.7 (*)    Neutro Abs 8.8 (*)    Eosinophils Absolute 0.9 (*)    All other components within normal limits  COMPREHENSIVE METABOLIC PANEL - Abnormal; Notable for the following:    Glucose, Bld 100 (*)    BUN 34 (*)    Creatinine, Ser 2.29 (*)    GFR calc non Af Amer 25 (*)    GFR calc Af Amer 29 (*)    All other components within normal limits  SEDIMENTATION RATE - Abnormal; Notable for the following:    Sed Rate 40 (*)    All other components within normal limits  GLUCOSE, CAPILLARY - Abnormal; Notable for the following:    Glucose-Capillary 141 (*)    All other components within normal limits  GLUCOSE, CAPILLARY - Abnormal; Notable for the following:    Glucose-Capillary 145 (*)    All other components within normal limits  GLUCOSE, CAPILLARY - Abnormal; Notable for the following:    Glucose-Capillary 117 (*)    All other components within normal limits  PROTIME-INR  APTT  URIC ACID  CK  GLUCOSE, CAPILLARY    Imaging Review Dg Chest 2 View  05/08/2015  CLINICAL DATA:  81 year old male with left arm swelling and pain. EXAM: CHEST  2 VIEW COMPARISON:  Chest x-ray a 04/04/2015. FINDINGS: Lung volumes are normal. No consolidative airspace disease. No pleural effusions. Cephalization of the pulmonary vasculature, without frank pulmonary edema. Heart size is mildly enlarged. Upper mediastinal contours are within normal limits. Left-sided pacemaker device in position with lead tips projecting over the expected location of the right atrium and right ventricular apex. IMPRESSION: 1. Cardiomegaly with pulmonary venous congestion, but no frank pulmonary edema. Electronically Signed   By: Vinnie Langton M.D.    On: 05/08/2015 02:05   Ct Head Wo Contrast  05/08/2015  CLINICAL DATA:  Acute onset of left arm weakness and swelling. Initial encounter. EXAM:  CT HEAD WITHOUT CONTRAST TECHNIQUE: Contiguous axial images were obtained from the base of the skull through the vertex without intravenous contrast. COMPARISON:  CT of the head performed 04/07/2010 FINDINGS: There is no evidence of acute infarction, mass lesion, or intra- or extra-axial hemorrhage on CT. Prominence of the ventricles and sulci reflects mild cortical volume loss. Scattered periventricular and subcortical white matter change likely reflects small vessel ischemic microangiopathy. The brainstem and fourth ventricle are within normal limits. The basal ganglia are unremarkable in appearance. The cerebral hemispheres demonstrate grossly normal gray-white differentiation. No mass effect or midline shift is seen. There is no evidence of fracture; visualized osseous structures are unremarkable in appearance. The orbits are within normal limits. The paranasal sinuses and mastoid air cells are well-aerated. No significant soft tissue abnormalities are seen. IMPRESSION: 1. No acute intracranial pathology seen on CT. 2. Mild cortical volume loss and scattered small vessel ischemic microangiopathy. Electronically Signed   By: Garald Balding M.D.   On: 05/08/2015 04:33   Ct Cervical Spine Wo Contrast  05/08/2015  CLINICAL DATA:  Bilateral upper extremity pain and radicular type symptoms. No known trauma EXAM: CT CERVICAL SPINE WITHOUT CONTRAST TECHNIQUE: Multidetector CT imaging of the cervical spine was performed without intravenous contrast. Multiplanar CT image reconstructions were also generated. COMPARISON:  None. FINDINGS: There is no fracture or spondylolisthesis. Prevertebral soft tissues and predental space regions are normal. There is calcification in the anterior ligament at C2-3, C5-6, C6-7, and C7-T1. There is mild disc space narrowing C4-5, C6-7, and  C7-T1. At C2-3, there is slight facet hypertrophy bilaterally. No nerve root edema or effacement. No disc extrusion or stenosis. At C3-4, there is mild facet hypertrophy bilaterally. No nerve root edema or effacement. No disc extrusion or stenosis. There is slight central disc bulging. At C4-5, there is mild facet osteoarthritic change bilaterally. There is slight exit foraminal narrowing due to bony hypertrophy on the right at this level. No nerve root edema or effacement. No disc extrusion or stenosis. At C5-6, there is moderate facet hypertrophy bilaterally. No nerve root edema or effacement. No disc extrusion or stenosis. At C6-7, there is mild facet hypertrophy bilaterally. There is mild central disc bulging. No nerve root edema or effacement. No disc extrusion or stenosis. At C7-T1, there is mild facet hypertrophy bilaterally. No nerve root edema or effacement. No disc extrusion or stenosis. There are foci of calcification in both carotid arteries. IMPRESSION: Multilevel osteoarthritic change. No nerve root edema or effacement. No disc extrusion or stenosis. No fracture or spondylolisthesis. Foci of calcification in each carotid artery noted. Electronically Signed   By: Lowella Grip III M.D.   On: 05/08/2015 18:33   I have personally reviewed and evaluated these images and lab results as part of my medical decision-making.   EKG Interpretation   Date/Time:  Wednesday May 08 2015 03:06:17 EDT Ventricular Rate:  81 PR Interval:  213 QRS Duration: 194 QT Interval:  471 QTC Calculation: 547 R Axis:   -61 Text Interpretation:  Sinus rhythm Borderline prolonged PR interval Left  bundle branch block Confirmed by Lita Mains  MD, Keanthony Poole ([id number]) on  05/08/2015 3:14:29 AM      MDM   Final diagnoses:  Swelling  Left arm pain    I personally performed the services described in this documentation, which was scribed in my presence. The recorded information has been reviewed and is accurate.    0101: Discussed with Dr. Oneida Alar. Recommended CT venogram. 0105: Discussed with radiology. Unable  to get CT due to renal insufficiency. 0110: Dr. Oneida Alar is aware of this. Advises to start heparin and get a chest x-ray. He'll see the patient in the emergency department.  Evaluated by Dr. Oneida Alar in the emergency department. Thinks likely neurologic issue. CT head ordered. Discussed with Dr. Aram Beecham who will direct the patient in the morning. Also spoke with Dr. Loleta Books of hospitalist service who will admit patient to [facility] bed. Vascular ultrasound ordered for tomorrow morning.  Julianne Rice, MD 05/08/15 2329

## 2015-05-08 NOTE — ED Notes (Signed)
Pt back from XR.  MD at bedside.

## 2015-05-08 NOTE — Consult Note (Signed)
Duplex shows no evidence of DVT Can d/c heparin No vascular surgical issues Will sign off  Ruta Hinds, MD [facility]: [phone] Pager: [phone]

## 2015-05-08 NOTE — H&P (Signed)
History and Physical  Patient Name: Rodney Farley     QMV:[id number]    DOB: [birth year]    DOA: 05/07/2015 Referring physician: Julianne Rice, MD PCP: [name], MD      Chief Complaint: hand pain  HPI: Rodney Farley is a 81 y.o. male with a past medical history significant for CHB with pacer, CAD s/p PCI x3 in Sept, hx TIA, IDDM c/b neuropathy and nephropathy, chronic systolic CHF (last EF 28-41% in 2016), and COPD who presents with left hand pain.  The patient was in his usual state of health until Monday when he had slow onset of left hand pain over the course of the afternoon. This pain is located in the left first MCP joint and dorsum of the left hand, it is excruciating with light touch, and is preventing him from sleeping. He thinks he notes slight swelling in the left wrist and weakness. Today the patient went to his PCP who is suspicious of left upper extremity DVT and sent the patient to the ER.  In the ED, there is no swelling or redness of the left upper extremity. Duplex ultrasound was unavailable, basilar surgery was consult and who evaluated the patient and recommended empiric initiation of heparin infusion and duplex ultrasound when available. A CT venogram was unavailable due to patient renal function. Neurology were also consulted given the pain out of proportion to physical exam. TRH were asked to admit for observation and pain control and duplex ultrasound when available.     Review of Systems:  Pt complains of left hand pain, swelling, left hand weakness. Pt denies any skin redness, other focal weakness, slurred speech, passing out, skin rashes, history of gout, history of similar pain, new medicines, injury.  All other systems negative except as just noted or noted in the history of present illness.  Allergies  Allergen Reactions  . Bee Venom Anaphylaxis  . Zocor [Simvastatin] Nausea Only and Other (See Comments)    Headache with brand name only.   Can take the generic.    Prior to Admission medications   Medication Sig Start Date End Date Taking? Authorizing Provider  aspirin EC 81 MG tablet Take 81 mg by mouth daily.    Historical Provider, MD  atorvastatin (LIPITOR) 40 MG tablet Take 1 tablet (40 mg total) by mouth daily. 04/19/15   [name], MD  carvedilol (COREG) 12.5 MG tablet Take 1 tablet (12.5 mg total) by mouth 2 (two) times daily with a meal. 10/17/14   Rogelia Mire, NP  clopidogrel (PLAVIX) 75 MG tablet Take 1 tablet (75 mg total) by mouth daily with breakfast. 03/29/15   Isaiah Serge, NP  furosemide (LASIX) 40 MG tablet Take 1 tablet (40 mg total) by mouth daily. 04/17/15   Burtis Junes, NP  hydrALAZINE (APRESOLINE) 25 MG tablet Take 1.5 tablets (37.5 mg total) by mouth 2 (two) times daily. Patient taking differently: Take 37.5 mg by mouth 3 (three) times daily.  04/17/15   Burtis Junes, NP  Insulin Glargine (LANTUS SOLOSTAR) 100 UNIT/ML Solostar Pen Inject 60 Units into the skin daily at 10 pm. 01/09/14   [name], MD  Insulin Pen Needle (B-D UF III MINI PEN NEEDLES) 31G X 5 MM MISC Use to test blood sugar 3 times daily. Dx code: 250.02 10/03/12   [name], MD  isosorbide mononitrate (IMDUR) 30 MG 24 hr tablet Take 1 tablet (30 mg total) by mouth daily. 03/29/15  Isaiah Serge, NP  nitroGLYCERIN (NITROSTAT) 0.4 MG SL tablet Place 1 tablet (0.4 mg total) under the tongue every 5 (five) minutes x 3 doses as needed for chest pain. 10/17/14   Rogelia Mire, NP  OVER THE COUNTER MEDICATION OTC [place] eye drops taking    Historical Provider, MD  pantoprazole (PROTONIX) 40 MG tablet Take 1 tablet (40 mg total) by mouth daily. 03/29/15   Isaiah Serge, NP  sertraline (ZOLOFT) 100 MG tablet Take 1.5 tablets (150 mg total) by mouth daily. 04/11/15   [name], MD  tamsulosin (FLOMAX) 0.4 MG CAPS capsule TAKE 1 CAPSULE BY MOUTH DAILY. 01/30/15   Tishira R Brewington, PA-C  terazosin  (HYTRIN) 1 MG capsule TAKE 1 CAPSULE BY MOUTH DAILY AT BEDTIME 10/16/14   [name], MD  vitamin B-12 (CYANOCOBALAMIN) 1000 MCG tablet Take 1,000 mcg by mouth daily.    Historical Provider, MD    Past Medical History  Diagnosis Date  . COPD (chronic obstructive pulmonary disease) (HCC)     Severe  . Hypercholesterolemia   . Depression   . Coronary artery disease     a. s/p MI in 1994/1995 while in [place] s/p questionable PCI. 03/2015: progression of disease, for staged PCI.  [name] Hypertension   . Atrioventricular block, complete ([place])     a. 2010 s/p pacemaker.  . Pacemaker     medtronic  . Obesity   . TIA (transient ischemic attack) X 3  . Neuropathy (HCC)     IN LOWER EXTREMITIES  . GERD (gastroesophageal reflux disease)   . DOE (dyspnea on exertion) 03/28/2015  . CKD (chronic kidney disease), stage IV ([place])   . Type II diabetes mellitus ([place])   . Diabetic peripheral neuropathy ([place])   . Chronic systolic CHF (congestive heart failure) ([place])     a. LVEF 35-40% by echo 09/2014.  . MI (myocardial infarction) ([place]) 1994; 1995  . Anginal pain ([place])   . Sleep apnea     "sleeps w/humidifyer when he panics and gets short of breath" (04/08/2015)  . Anxiety     Past Surgical History  Procedure Laterality Date  . Tonsillectomy    . Cholecystectomy open  ~ 1977  . Insert / replace / remove pacemaker  07/2008    Complete heart block status post DDD with good function  . Hiatal hernia repair  1977  . Cardiac catheterization N/A 03/29/2015    Procedure: Right/Left Heart Cath and Coronary Angiography;  Surgeon: Adiel M [name], MD;  Location: [facility];  Service: Cardiovascular;  Laterality: N/A;  . Cardiac catheterization  1995    "after my MI; put me on heart RX after cath"  . Cardiac catheterization N/A 04/09/2015    Procedure: Coronary Stent Intervention;  Surgeon: Zayvion M [name], MD;  Location: [facility];  Service: Cardiovascular;  Laterality: N/A;    Family  history: family history includes Heart attack in his brother; Leukemia in his father; Stroke in his mother and sister.  Social History: Patient lives with his wife. He is retired. He is a former smoker.       Physical Exam: BP 146/60 mmHg  Pulse 73  Temp(Src) 99.6 F (37.6 C) (Oral)  Resp 23  Ht 5' 7" (1.702 m)  Wt 100.245 kg (221 lb)  BMI 34.61 kg/m2  SpO2 95% General appearance: Older obese adult male, alert and in mild distress from pain.   Eyes: Anicteric, conjunctiva pink, lids and  lashes normal.     ENT: No nasal deformity, discharge, or epistaxis.  OP moist without lesions.   Skin: Warm and dry.  There is mild redness over the left MCP, but no swelling. Cardiac: RRR, nl S1-S2, no murmurs appreciated.  Capillary refill is brisk.  The radial pulses are 2+ and symmetric. Respiratory: Normal respiratory rate and rhythm.  CTAB without rales or wheezes. Abdomen: Abdomen soft without rigidity.  No TTP. No ascites, distension.   MSK: No deformities or effusions.  The left arm is not swollen.  There is no joint swelling in the left hand. Neuro: Muscle testing in the left hand is limited by pain.  The patient does seem to have some ability to resist me with finger flexion.  There is severe pain to movement of the wrist or to light touch.  Sensorium intact and responding to questions, attention normal.  Speech is fluent.  Moves all extremities equally and with normal coordination.    Psych: Behavior appropriate.  Affect anxious.  No evidence of aural or visual hallucinations or delusions.       Labs on Admission:  The metabolic panel shows stable creatinine at 2.29 mg/dL from baseline 2.0 mg/dL. The transaminases and bilirubin are normal. The WBC is elevated to 12.2 K per UL and the hemoglobin and platelet count are normal. INR is normal.    Radiological Exams on Admission: Personally reviewed: Dg Chest 2 View  05/08/2015  CLINICAL DATA:  81 year old male with left arm swelling  and pain. EXAM: CHEST  2 VIEW COMPARISON:  Chest x-ray a 04/04/2015. FINDINGS: Lung volumes are normal. No consolidative airspace disease. No pleural effusions. Cephalization of the pulmonary vasculature, without frank pulmonary edema. Heart size is mildly enlarged. Upper mediastinal contours are within normal limits. Left-sided pacemaker device in position with lead tips projecting over the expected location of the right atrium and right ventricular apex. IMPRESSION: 1. Cardiomegaly with pulmonary venous congestion, but no frank pulmonary edema. Electronically Signed   By: Vinnie Langton M.D.   On: 05/08/2015 02:05      Assessment/Plan  1. Left hand pain:  This is new.  The differential includes complication regional pain syndrome, carpal tunnel syndrome, gout, mononeuritis/vasculitis, and myopathy.   -Uric acid -ESR -CK -Consult to neurology, appreciate recommendations -Consult to vascular surgery, appreciate recommendations -Continue empiric heparin until duplex ultrasound was performed this morning, if negative start heparin.  2. CAD:  Clinically stable. -Continue home statin, beta blocker, hydralazine, Imdur, terazosin and aspirin  3. Chronic systolic CHF:  Clinically stable. -Continue home furosemide, beta blocker, hydralazine, Imdur  4. IDDM:  Continue home glargine  5. BPH:  Stable.  -Continue Flomax  6. Depression:  Stable.  -Continue sertraline  7. GERD:  Stable.  -Continue PPI     DVT PPx: Heparin Diet: Cardiac, carb modified Consultants: Vascular surgery and neurology Code Status: Full  Medical decision making: What exists of the patient's previous chart was reviewed in depth and the case was discussed with Dr. Lita Mains. Patient seen 3:52 AM on 05/08/2015.  Disposition Plan:  Duplex US this morning and then continue or discontinue heparin based on results.  Consult to Neurology.  Follow up uric acid, ESR and CK.  If all normal and pain  controlled/improving with oral pain medicines, can be discharged in next 24-48 hours.      Edwin Dada [facility] Pager [phone]

## 2015-05-08 NOTE — Progress Notes (Signed)
*  PRELIMINARY RESULTS* Vascular Ultrasound Left upper extremity venous duplex has been completed.  Preliminary findings: no evidence of DVT or SVT.   Landry Mellow, RDMS, RVT  05/08/2015, 8:54 AM

## 2015-05-08 NOTE — ED Notes (Signed)
Vascular surgeon in room

## 2015-05-08 NOTE — Evaluation (Signed)
Physical Therapy Evaluation Patient Details Name: Rodney Farley MRN: [id number] DOB: 01-15-1932 Today's Date: 05/08/2015   History of Present Illness  Rodney Farley is a 81 y.o. male, with a significant PMhx, who presents to the Emergency Department complaining of sudden onset, constant, progressively worsening left forearm pain onset 1 day ago with pain more predominantly in left hand. Pt has associated decreased grip strength and ROM in left hand and arm. He also associates more recent onset of pallor in left hand and possible mild swelling to his left hand. He was advised to present to the ED by his PCP for concern of DVT. His pain is worse with palpation or ROM of left arm. Pt is right-hand dominant. He endorses night sweats at baseline but denies any worsening or new diaphoresis. Also has left foot edema at baseline. PMH: CAD, MI, stents placed 04/16/15  Clinical Impression  Pt admitted with above diagnosis. Pt currently with functional limitations due to the deficits listed below (see PT Problem List). Pt having difficulty with bed mobility and transfers due to the inability to use UE's, requiring min A. During ambulation, pt had sudden shaking and then buckling of left knee and chair brought for pt to sit. This is new as well, not associated with pain. Pt will benefit from skilled PT to increase their independence and safety with mobility to allow discharge to the venue listed below.       Follow Up Recommendations Home health PT    Equipment Recommendations  Other (comment) (tbd)    Recommendations for Other Services OT consult     Precautions / Restrictions Precautions Precautions: Fall Restrictions Weight Bearing Restrictions: No      Mobility  Bed Mobility Overal bed mobility: Needs Assistance Bed Mobility: Supine to Sit     Supine to sit: Mod assist     General bed mobility comments: pt able to roll to left with momentum to get into SL and then mod A to elevate  trunk because pt could not use UE's due to pain  Transfers Overall transfer level: Needs assistance   Transfers: Sit to/from Stand Sit to Stand: Min assist         General transfer comment: min A for power up without use of pt's hands, min A also to steady as well as control descent into sitting  Ambulation/Gait Ambulation/Gait assistance: Min assist Ambulation Distance (Feet): 150 Feet Assistive device: 1 person hand held assist Gait Pattern/deviations: Step-through pattern Gait velocity: decreased   General Gait Details: slight posterior lean with ambulation with high guard arms. Pt ambulating well until 150' when left knee suddenly began to buckle. Pt reports that this is new to him today (happened a bit earlier with RN to bathroom as well). Chair brought to pt and pt pushed back to room  Stairs            Wheelchair Mobility    Modified Rankin (Stroke Patients Only) Modified Rankin (Stroke Patients Only) Pre-Morbid Rankin Score: Moderate disability Modified Rankin: Moderately severe disability     Balance Overall balance assessment: Needs assistance Sitting-balance support: No upper extremity supported Sitting balance-Leahy Scale: Good   Postural control: Posterior lean Standing balance support: No upper extremity supported Standing balance-Leahy Scale: Fair                               Pertinent Vitals/Pain Pain Assessment: Faces Faces Pain Scale: Hurts even more Pain  Location: bilateral hands L>R Pain Descriptors / Indicators: Aching Pain Intervention(s): Limited activity within patient's tolerance;Monitored during session         HR 82 bpm with ambulation  Home Living Family/patient expects to be discharged to:: Private residence Living Arrangements: Spouse/significant other Available Help at Discharge: Family;Available PRN/intermittently Type of Home: House Home Access: Stairs to enter Entrance Stairs-Rails: Right Entrance  Stairs-Number of Steps: 4 Home Layout: One level Home Equipment: None;Cane - single point Additional Comments: pt's wife is NT on 3E. Pt has steep driveway that was a problem for him before he had stents placed.     Prior Function Level of Independence: Independent         Comments: pt was independent but not very active though he has been more active since stent placement. Kept cane in car     Hand Dominance   Dominant Hand: Right    Extremity/Trunk Assessment   Upper Extremity Assessment: RUE deficits/detail;LUE deficits/detail RUE Deficits / Details: no pain until 11am this morning, not pain from hand to wrist with inability to approximate thumb to any finger. Painful to light touch     LUE Deficits / Details: worse than right, pain hand to elbow as well as neck, decreased ROM fingers compared to right   Lower Extremity Assessment: RLE deficits/detail;LLE deficits/detail RLE Deficits / Details: strength WFL but sensory deficits from peripheral neuropathy LLE Deficits / Details: same as RLE  Cervical / Trunk Assessment: Kyphotic  Communication   Communication: No difficulties  Cognition Arousal/Alertness: Awake/alert Behavior During Therapy: WFL for tasks assessed/performed Overall Cognitive Status: No family/caregiver present to determine baseline cognitive functioning       Memory: Decreased short-term memory (repeated self during session, may be baseline?)              General Comments General comments (skin integrity, edema, etc.): pt concerned about pain and ROM/ strength issues after the gains he had made after cardiac stents    Exercises        Assessment/Plan    PT Assessment Patient needs continued PT services  PT Diagnosis Difficulty walking;Generalized weakness;Acute pain   PT Problem List Decreased strength;Decreased activity tolerance;Decreased balance;Decreased mobility;Decreased coordination;Decreased cognition;Decreased knowledge of  precautions;Pain;Impaired sensation  PT Treatment Interventions Gait training;Stair training;Functional mobility training;Therapeutic activities;Therapeutic exercise;Balance training;Neuromuscular re-education;Patient/family education;Cognitive remediation   PT Goals (Current goals can be found in the Care Plan section) Acute Rehab PT Goals Patient Stated Goal: figue out what's going on PT Goal Formulation: With patient Time For Goal Achievement: 05/22/15 Potential to Achieve Goals: Good    Frequency Min 3X/week   Barriers to discharge Decreased caregiver support alone at times    Co-evaluation               End of Session Equipment Utilized During Treatment: Gait belt Activity Tolerance: Patient tolerated treatment well Patient left: in chair;with chair alarm set;with call bell/phone within reach Nurse Communication: Mobility status    Functional Assessment Tool Used: clinical judgement Functional Limitation: Mobility: Walking and moving around Mobility: Walking and Moving Around Current Status [phone]): At least 20 percent but less than 40 percent impaired, limited or restricted Mobility: Walking and Moving Around Goal Status [phone]): At least 1 percent but less than 20 percent impaired, limited or restricted    Time: 1532-1600 PT Time Calculation (min) (ACUTE ONLY): 28 min   Charges:   PT Evaluation $Initial PT Evaluation Tier I: 1 Procedure PT Treatments $Gait Training: 8-22 mins   PT G Codes:  PT G-Codes **NOT FOR INPATIENT CLASS** Functional Assessment Tool Used: clinical judgement Functional Limitation: Mobility: Walking and moving around Mobility: Walking and Moving Around Current Status [phone]): At least 20 percent but less than 40 percent impaired, limited or restricted Mobility: Walking and Moving Around Goal Status [phone]): At least 1 percent but less than 20 percent impaired, limited or restricted   Leighton Roach, PT  Acute Rehab Services   [phone]  Leighton Roach 05/08/2015, 4:16 PM

## 2015-05-08 NOTE — Progress Notes (Signed)
ANTICOAGULATION CONSULT NOTE - Initial Consult  Pharmacy Consult for Heparin  Indication: Rule out DVT  Allergies  Allergen Reactions  . Bee Venom Anaphylaxis  . Zocor [Simvastatin] Nausea Only and Other (See Comments)    Headache with brand name only.  Can take the generic.    Patient Measurements: Height: 5\' 7"  (170.2 cm) Weight: 221 lb (100.245 kg) IBW/kg (Calculated) : 66.1  Vital Signs: Temp: 99.6 F (37.6 C) (10/25 2119) Temp Source: Oral (10/25 2119) BP: 154/51 mmHg (10/26 0100) Pulse Rate: 65 (10/26 0100)  Labs:  Recent Labs  05/07/15 2225  HGB 13.0  HCT 38.7*  PLT 195  CREATININE 2.29*    Estimated Creatinine Clearance: 28.5 mL/min (by C-G formula based on Cr of 2.29).   Medical History: Past Medical History  Diagnosis Date  . COPD (chronic obstructive pulmonary disease) (HCC)     Severe  . Hypercholesterolemia   . Depression   . Coronary artery disease     a. s/p MI in 1994/1995 while in [place] s/p questionable PCI. 03/2015: progression of disease, for staged PCI.  [name] Hypertension   . Atrioventricular block, complete ([place])     a. 2010 s/p pacemaker.  . Pacemaker     medtronic  . Obesity   . TIA (transient ischemic attack) X 3  . Neuropathy (HCC)     IN LOWER EXTREMITIES  . GERD (gastroesophageal reflux disease)   . DOE (dyspnea on exertion) 03/28/2015  . CKD (chronic kidney disease), stage IV ([place])   . Type II diabetes mellitus ([place])   . Diabetic peripheral neuropathy ([place])   . Chronic systolic CHF (congestive heart failure) ([place])     a. LVEF 35-40% by echo 09/2014.  . MI (myocardial infarction) ([place]) 1994; 1995  . Anginal pain ([place])   . Sleep apnea     "sleeps w/humidifyer when he panics and gets short of breath" (04/08/2015)  . Anxiety     Assessment: Heparin for rule out DVT, CBC good, noted renal dysfunction, PTA meds reviewed, other labs as above.   Goal of Therapy:  Heparin level 0.3-0.7 units/ml Monitor platelets by  anticoagulation protocol: Yes   Plan:  -Heparin 4000 units BOLUS -Start heparin drip at 1000 units/hr -1000 HL -Daily CBC/HL -Monitor for bleeding -F/U VTE work-up  Narda Bonds 05/08/2015,1:23 AM

## 2015-05-08 NOTE — Consult Note (Signed)
[facility] [place] Consult note Requesting: Lita Mains MD  Reason for consult: rule out left arm phlegmasia History of Present Illness:  Patient is a 81 y.o. year old male who presents for evaluation of left arm weakness.  He does not really describe loss of sensation.  Pt had fairly sudden onset of left arm weakness which started about 48 hr ago.  He has had some mild swelling.  He has pain in the left wrist and right neck.  He has has multiple admissions recently for cardiac issues including 3 recent stents and a right heart cath.  He has a left side pacemaker (2010) that has been in chronically.  He has had IV catheters in the left arm recently.  He has progressed over the last 24 hours with weakening of the left hand and arm.  He denies prior history of stroke.  He does have a history of TIAs listed in his past medical history.  He denies trauma.  Other medical problems include COPD, renal dysfunction, CAD, diabetes all of which are stable.  Past Medical History  Diagnosis Date  . COPD (chronic obstructive pulmonary disease) (HCC)     Severe  . Hypercholesterolemia   . Depression   . Coronary artery disease     a. s/p MI in 1994/1995 while in [place] s/p questionable PCI. 03/2015: progression of disease, for staged PCI.  [name] Hypertension   . Atrioventricular block, complete ([place])     a. 2010 s/p pacemaker.  . Pacemaker     medtronic  . Obesity   . TIA (transient ischemic attack) X 3  . Neuropathy (HCC)     IN LOWER EXTREMITIES  . GERD (gastroesophageal reflux disease)   . DOE (dyspnea on exertion) 03/28/2015  . CKD (chronic kidney disease), stage IV ([place])   . Type II diabetes mellitus ([place])   . Diabetic peripheral neuropathy ([place])   . Chronic systolic CHF (congestive heart failure) ([place])     a. LVEF 35-40% by echo 09/2014.  . MI (myocardial infarction) ([place]) 1994; 1995  . Anginal pain ([place])   . Sleep apnea     "sleeps w/humidifyer when he panics and gets  short of breath" (04/08/2015)  . Anxiety     Past Surgical History  Procedure Laterality Date  . Tonsillectomy    . Cholecystectomy open  ~ 1977  . Insert / replace / remove pacemaker  07/2008    Complete heart block status post DDD with good function  . Hiatal hernia repair  1977  . Cardiac catheterization N/A 03/29/2015    Procedure: Right/Left Heart Cath and Coronary Angiography;  Surgeon: Mattias M [name], MD;  Location: [facility];  Service: Cardiovascular;  Laterality: N/A;  . Cardiac catheterization  1995    "after my MI; put me on heart RX after cath"  . Cardiac catheterization N/A 04/09/2015    Procedure: Coronary Stent Intervention;  Surgeon: Owin M [name], MD;  Location: [facility];  Service: Cardiovascular;  Laterality: N/A;    Social History Social History  Substance Use Topics  . Smoking status: Former Smoker -- 1.50 packs/day for 54 years    Types: Cigarettes    Quit date: 07/18/2007  . Smokeless tobacco: Never Used  . Alcohol Use: Yes     Comment: 04/08/2015 "probably 3 drinks/month"    Family History Family History  Problem Relation Age of Onset  . Stroke Mother   . Leukemia Father   . Stroke  Sister   . Heart attack Brother     Allergies  Allergies  Allergen Reactions  . Bee Venom Anaphylaxis  . Zocor [Simvastatin] Nausea Only and Other (See Comments)    Headache with brand name only.  Can take the generic.     Current Facility-Administered Medications  Medication Dose Route Frequency Provider Last Rate Last Dose  . heparin ADULT infusion 100 units/mL (25000 units/250 mL)  1,000 Units/hr Intravenous Continuous Julianne Rice, MD 10 mL/hr at 05/08/15 0134 1,000 Units/hr at 05/08/15 0134  . sodium chloride 0.9 % bolus 500 mL  500 mL Intravenous Once Julianne Rice, MD 500 mL/hr at 05/08/15 0129 500 mL at 05/08/15 0129   Current Outpatient Prescriptions  Medication Sig Dispense Refill  . aspirin EC 81 MG tablet Take 81 mg by mouth  daily.    [name] atorvastatin (LIPITOR) 40 MG tablet Take 1 tablet (40 mg total) by mouth daily. 90 tablet 3  . carvedilol (COREG) 12.5 MG tablet Take 1 tablet (12.5 mg total) by mouth 2 (two) times daily with a meal. 60 tablet 6  . clopidogrel (PLAVIX) 75 MG tablet Take 1 tablet (75 mg total) by mouth daily with breakfast. 30 tablet 6  . furosemide (LASIX) 40 MG tablet Take 1 tablet (40 mg total) by mouth daily. 90 tablet 3  . hydrALAZINE (APRESOLINE) 25 MG tablet Take 1.5 tablets (37.5 mg total) by mouth 2 (two) times daily. (Patient taking differently: Take 37.5 mg by mouth 3 (three) times daily. ) 90 tablet 11  . Insulin Glargine (LANTUS SOLOSTAR) 100 UNIT/ML Solostar Pen Inject 60 Units into the skin daily at 10 pm. 5 pen PRN  . Insulin Pen Needle (B-D UF III MINI PEN NEEDLES) 31G X 5 MM MISC Use to test blood sugar 3 times daily. Dx code: 250.02 100 each 11  . isosorbide mononitrate (IMDUR) 30 MG 24 hr tablet Take 1 tablet (30 mg total) by mouth daily. 30 tablet 6  . nitroGLYCERIN (NITROSTAT) 0.4 MG SL tablet Place 1 tablet (0.4 mg total) under the tongue every 5 (five) minutes x 3 doses as needed for chest pain. 25 tablet 3  . OVER THE COUNTER MEDICATION OTC [place] eye drops taking    . pantoprazole (PROTONIX) 40 MG tablet Take 1 tablet (40 mg total) by mouth daily. 30 tablet 6  . sertraline (ZOLOFT) 100 MG tablet Take 1.5 tablets (150 mg total) by mouth daily. 135 tablet 1  . tamsulosin (FLOMAX) 0.4 MG CAPS capsule TAKE 1 CAPSULE BY MOUTH DAILY. 30 capsule PRN  . terazosin (HYTRIN) 1 MG capsule TAKE 1 CAPSULE BY MOUTH DAILY AT BEDTIME 90 capsule 1  . vitamin B-12 (CYANOCOBALAMIN) 1000 MCG tablet Take 1,000 mcg by mouth daily.      ROS:   General:  No weight loss, Fever, chills  HEENT: No recent headaches, no nasal bleeding, no visual changes, no sore throat  Neurologic: No dizziness, blackouts, seizures. No recent symptoms of stroke or mini- stroke. No recent episodes of slurred  speech, or temporary blindness.  Cardiac: No recent episodes of chest pain/pressure, no shortness of breath at rest.  No shortness of breath with exertion but improved since recent stent.   Vascular: No history of rest pain in feet.  No history of claudication.  No history of non-healing ulcer, No history of DVT   Pulmonary:  no productive cough, no hemoptysis,  No asthma or wheezing  Musculoskeletal:  [ ]  Arthritis, [ ]  Low back pain,  [ ]   Joint pain  Hematologic:No history of hypercoagulable state.  No history of easy bleeding.  No history of anemia  Gastrointestinal: No hematochezia or melena,  No gastroesophageal reflux, no trouble swallowing  Urinary: [x ] chronic Kidney disease, [ ]  on HD - [ ]  MWF or [ ]  TTHS, [ ]  Burning with urination, [ ]  Frequent urination, [ ]  Difficulty urinating;   Skin: No rashes  Psychological: + history of anxiety,  No history of depression   Physical Examination  Filed Vitals:   05/08/15 0032 05/08/15 0045 05/08/15 0100 05/08/15 0115  BP:  157/57 154/51 146/60  Pulse: 70 65 65 73  Temp:      TempSrc:      Resp: 20 24 17 23   Height:      Weight:      SpO2: 96% 96% 96% 95%    Body mass index is 34.61 kg/(m^2).  General:  Alert and oriented, no acute distress HEENT: Normal Neck: No  JVD Pulmonary: Clear to auscultation bilaterally, left side pacemaker non tender, a few chest wall vein collaterals around the pacer Cardiac: Regular Rate and Rhythm  Abdomen: Soft, non-tender, non-distended, no mass Skin: No rash color of left hand similar to right Extremity Pulses:  2+ radial, brachial, femoral, dorsalis pedis, pulses bilaterally Musculoskeletal: No deformity, trace edema bilateral lower extremities and left hand arm slightly more swollen compared to right color of both upper extremities is similar, no bluish discoloration of left arm, forearm compartments left side slightly more firm than right Neurologic: Upper and lower extremity motor 5/5  right side.  Left upper extremity 4/5 arm flexion and extension with some pain, 3/5 left hand intrinsics, 3/5 left wrist extension, pain on palpation radial aspect of left wrist  DATA:    CBC    Component Value Date/Time   WBC 12.2* 05/07/2015 2225   WBC 8.7 04/04/2015 1228   RBC 4.22 05/07/2015 2225   RBC 4.45* 04/04/2015 1228   HGB 13.0 05/07/2015 2225   HGB 12.7* 04/04/2015 1228   HCT 38.7* 05/07/2015 2225   HCT 39.9* 04/04/2015 1228   PLT 195 05/07/2015 2225   MCV 91.7 05/07/2015 2225   MCV 89.7 04/04/2015 1228   MCH 30.8 05/07/2015 2225   MCH 28.6 04/04/2015 1228   MCHC 33.6 05/07/2015 2225   MCHC 31.9 04/04/2015 1228   RDW 14.0 05/07/2015 2225   LYMPHSABS 1.5 05/07/2015 2225   MONOABS 1.0 05/07/2015 2225   EOSABS 0.9* 05/07/2015 2225   BASOSABS 0.0 05/07/2015 2225    BMET    Component Value Date/Time   NA 137 05/07/2015 2225   K 4.5 05/07/2015 2225   CL 103 05/07/2015 2225   CO2 24 05/07/2015 2225   GLUCOSE 100* 05/07/2015 2225   BUN 34* 05/07/2015 2225   CREATININE 2.29* 05/07/2015 2225   CREATININE 2.22* 04/17/2015 0935   CALCIUM 9.5 05/07/2015 2225   GFRNONAA 25* 05/07/2015 2225   GFRAA 29* 05/07/2015 2225    ASSESSMENT:  Left arm swelling with primarily left wrist pain and loss of motor function with radial median and ulnar nerve weakness in hand and arm.  Although he certainly could have a DVT in his left arm he does not have phlegmasia and I do not believe this is the cause of all of his neuro deficits.  No evidence of arterial occlusive disease with easily palpable pulses and he has normal perfusion to the left arm.   PLAN:  1. Continue heparin for now.  DVT US  left arm when vascular lab opens this morning              2. Head CT/ MRI to rule out stroke              3. Neurology consult  Ruta Hinds, MD [facility] Office: [phone] Pager: [phone]

## 2015-05-08 NOTE — Consult Note (Addendum)
NEURO HOSPITALIST CONSULT NOTE   Referring physician: ED Reason for Consult: new onset left forearm pain-paresthesias-weakness with associated swelling and discoloration of the same extremity.Rodney Farley  HPI:                                                                                                                                          Rodney Farley is an 81 y.o. male, right handed, with a past medical history significant for HTN, HLD, DM type II, obesity, CAD s/p stent deployment, MI, chronic congestive heart failure, s/p pacemaker placement due to complete AV block, CKD, OSA, TIA, and depression, comes in for evaluation of the aforementioned symptoms. Patient indicated that 2 days ago he started feeling " a weird sensation that something was there in my right hand" and subsequently developed very acute onset of a severe, sharp, intermittent pain that starts around the left elbow and travels to the antero-lateral aspect of the left forearm and then goes to his hand. Additionally, he describes prominent, off and on tingling-pins and needles and some numbness in the same distribution.  He indicated that any type of movements of the left arm creates a lot of pain and for that reason he decided to stay in bed the entire day. Although he is also experiencing milder neck pain, he thinks that this neck pain occurs independently of the left forearm-hand pain. Of note, denies shoulder or axillary pain. No recent injury to the left UE or neck. No fever, infection, or recent vaccinations. Denies HA, vertigo, double vision, slurred speech, language or vision impairment. Mr [name] said that he initially thought he was having some swelling and discoloration of the left forearm but now is not noticing such changes. He was evaluated by vascular surgery who documented presence of pulses in the left UE and recommended starting IV heparin and [place] left UE.  CT head was personally reviewed and  showed no acute abnormality. Pain control with morphine x 2 doses without dramatic or long lasting improvement.   Past Medical History  Diagnosis Date  . COPD (chronic obstructive pulmonary disease) (HCC)     Severe  . Hypercholesterolemia   . Depression   . Coronary artery disease     a. s/p MI in 1994/1995 while in [place] s/p questionable PCI. 03/2015: progression of disease, for staged PCI.  Rodney Farley Hypertension   . Atrioventricular block, complete ([place])     a. 2010 s/p pacemaker.  . Pacemaker     medtronic  . Obesity   . TIA (transient ischemic attack) X 3  . Neuropathy (HCC)     IN LOWER EXTREMITIES  . GERD (gastroesophageal reflux disease)   . DOE (dyspnea on exertion) 03/28/2015  . CKD (chronic kidney disease), stage IV ([place])   . Type  II diabetes mellitus ([place])   . Diabetic peripheral neuropathy ([place])   . Chronic systolic CHF (congestive heart failure) ([place])     a. LVEF 35-40% by echo 09/2014.  . MI (myocardial infarction) ([place]) 1994; 1995  . Anginal pain ([place])   . Sleep apnea     "sleeps w/humidifyer when he panics and gets short of breath" (04/08/2015)  . Anxiety     Past Surgical History  Procedure Laterality Date  . Tonsillectomy    . Cholecystectomy open  ~ 1977  . Insert / replace / remove pacemaker  07/2008    Complete heart block status post DDD with good function  . Hiatal hernia repair  1977  . Cardiac catheterization N/A 03/29/2015    Procedure: Right/Left Heart Cath and Coronary Angiography;  Surgeon: Shermar M [name], MD;  Location: [facility];  Service: Cardiovascular;  Laterality: N/A;  . Cardiac catheterization  1995    "after my MI; put me on heart RX after cath"  . Cardiac catheterization N/A 04/09/2015    Procedure: Coronary Stent Intervention;  Surgeon: Denman M [name], MD;  Location: [facility];  Service: Cardiovascular;  Laterality: N/A;    Family History  Problem Relation Age of Onset  . Stroke Mother   . Leukemia Father   . Stroke  Sister   . Heart attack Brother     Family History: no MS, epilepsy, or brain tumor   Social History:  reports that he quit smoking about 7 years ago. His smoking use included Cigarettes. He has a 81 pack-year smoking history. He has never used smokeless tobacco. He reports that he drinks alcohol. He reports that he does not use illicit drugs.  Allergies  Allergen Reactions  . Bee Venom Anaphylaxis  . Zocor [Simvastatin] Nausea Only and Other (See Comments)    Headache with brand name only.  Can take the generic.    MEDICATIONS:                                                                                                                     Scheduled: . aspirin EC  81 mg Oral Daily  . atorvastatin  40 mg Oral Daily  . carvedilol  12.5 mg Oral BID WC  . clopidogrel  75 mg Oral Q breakfast  . furosemide  40 mg Oral Daily  . hydrALAZINE  37.5 mg Oral BID  . insulin glargine  60 Units Subcutaneous Q2200  . isosorbide mononitrate  30 mg Oral Daily  . oxyCODONE-acetaminophen  2 tablet Oral Once  . pantoprazole  40 mg Oral Daily  . sertraline  150 mg Oral Daily  . tamsulosin  0.4 mg Oral Daily  . terazosin  1 mg Oral QHS     ROS:  History obtained from chart review and the patient  General ROS: negative for - chills, fatigue, fever, night sweats, weight gain or weight loss Psychological ROS: negative for - behavioral disorder, hallucinations, memory difficulties, mood swings or suicidal ideation Ophthalmic ROS: negative for - blurry vision, double vision, eye pain or loss of vision ENT ROS: negative for - epistaxis, nasal discharge, oral lesions, sore throat, tinnitus or vertigo Allergy and Immunology ROS: negative for - hives or itchy/watery eyes Hematological and Lymphatic ROS: negative for - bleeding problems, bruising or swollen lymph  nodes Endocrine ROS: negative for - galactorrhea, hair pattern changes, polydipsia/polyuria or temperature intolerance Respiratory ROS: negative for - cough, hemoptysis, shortness of breath or wheezing Cardiovascular ROS: negative for - chest pain, dyspnea on exertion, edema or irregular heartbeat Gastrointestinal ROS: negative for - abdominal pain, diarrhea, hematemesis, nausea/vomiting or stool incontinence Genito-Urinary ROS: negative for - dysuria, hematuria, incontinence or urinary frequency/urgency Musculoskeletal ROS: negative for - joint swelling Neurological ROS: as noted in HPI Dermatological ROS: negative for rash and skin lesion changes  Physical exam:  Constitutional: well developed, pleasant male in some distress due to pain. Blood pressure 158/69, pulse 77, temperature 99.6 F (37.6 C), temperature source Oral, resp. rate 21, height '5\' 7"'  (1.702 m), weight 100.245 kg (221 lb), SpO2 95 %. Eyes: no jaundice or exophthalmos.  Head: normocephalic. Neck: supple, no bruits, no JVD. Cardiac: no murmurs. Lungs: clear. Abdomen: soft, no tender, no mass. Extremities: no edema, clubbing, or cyanosis. Radial and brachial pulses are 2+ and symmetric Skin: no rash   Neurologic Examination:                                                                                                      General: NAD Mental Status: Alert, oriented, thought content appropriate.  Speech fluent without evidence of aphasia.  Able to follow 3 step commands without difficulty. Cranial Nerves: II: Discs flat bilaterally; Visual fields grossly normal, pupils equal, round, reactive to light and accommodation III,IV, VI: ptosis not present, extra-ocular motions intact bilaterally V,VII: smile symmetric, facial light touch sensation normal bilaterally VIII: hearing normal bilaterally IX,X: uvula rises symmetrically XI: bilateral shoulder shrug XII: midline tongue extension without atrophy or  fasciculations Motor: 5/5 right UE and lower extremities bilaterally. Severe pain that is triggered by any movements as well as light touch (allodynia) precludes muscle testing in the left UE proximally and distally. Tone and bulk:normal tone throughout; no atrophy noted Sensory: can not test in the left forearm and hand due to prominent allodynia. Deep Tendon Reflexes:  1+ all over Plantars: Right: downgoing   Left: downgoing Cerebellar: normal finger-to-nose,  normal heel-to-shin test on the right. No tested on the left due to pain. Gait:  No tested due to multiple leads.    Lab Results  Component Value Date/Time   CHOL 215* 09/19/2014 02:48 PM    Results for orders placed or performed during the hospital encounter of 05/07/15 (from the past 48 hour(s))  CBC with Differential     Status: Abnormal   Collection Time: 05/07/15 10:25 PM  Result Value Ref Range   WBC 12.2 (H) 4.0 - 10.5 K/uL   RBC 4.22 4.22 - 5.81 MIL/uL   Hemoglobin 13.0 13.0 - 17.0 g/dL   HCT 38.7 (L) 39.0 - 52.0 %   MCV 91.7 78.0 - 100.0 fL   MCH 30.8 26.0 - 34.0 pg   MCHC 33.6 30.0 - 36.0 g/dL   RDW 14.0 11.5 - 15.5 %   Platelets 195 150 - 400 K/uL   Neutrophils Relative % 72 %   Neutro Abs 8.8 (H) 1.7 - 7.7 K/uL   Lymphocytes Relative 12 %   Lymphs Abs 1.5 0.7 - 4.0 K/uL   Monocytes Relative 8 %   Monocytes Absolute 1.0 0.1 - 1.0 K/uL   Eosinophils Relative 8 %   Eosinophils Absolute 0.9 (H) 0.0 - 0.7 K/uL   Basophils Relative 0 %   Basophils Absolute 0.0 0.0 - 0.1 K/uL  Comprehensive metabolic panel     Status: Abnormal   Collection Time: 05/07/15 10:25 PM  Result Value Ref Range   Sodium 137 135 - 145 mmol/L   Potassium 4.5 3.5 - 5.1 mmol/L   Chloride 103 101 - 111 mmol/L   CO2 24 22 - 32 mmol/L   Glucose, Bld 100 (H) 65 - 99 mg/dL   BUN 34 (H) 6 - 20 mg/dL   Creatinine, Ser 2.29 (H) 0.61 - 1.24 mg/dL   Calcium 9.5 8.9 - 10.3 mg/dL   Total Protein 6.8 6.5 - 8.1 g/dL   Albumin 3.7 3.5 - 5.0  g/dL   AST 25 15 - 41 U/L   ALT 29 17 - 63 U/L   Alkaline Phosphatase 102 38 - 126 U/L   Total Bilirubin 0.7 0.3 - 1.2 mg/dL   GFR calc non Af Amer 25 (L) >60 mL/min   GFR calc Af Amer 29 (L) >60 mL/min    Comment: (NOTE) The eGFR has been calculated using the CKD EPI equation. This calculation has not been validated in all clinical situations. eGFR's persistently <60 mL/min signify possible Chronic Kidney Disease.    Anion gap 10 5 - 15  Protime-INR     Status: None   Collection Time: 05/08/15  1:28 AM  Result Value Ref Range   Prothrombin Time 14.3 11.6 - 15.2 seconds   INR 1.09 0.00 - 1.49  APTT     Status: None   Collection Time: 05/08/15  1:28 AM  Result Value Ref Range   aPTT 29 24 - 37 seconds    Dg Chest 2 View  05/08/2015  CLINICAL DATA:  81 year old male with left arm swelling and pain. EXAM: CHEST  2 VIEW COMPARISON:  Chest x-ray a 04/04/2015. FINDINGS: Lung volumes are normal. No consolidative airspace disease. No pleural effusions. Cephalization of the pulmonary vasculature, without frank pulmonary edema. Heart size is mildly enlarged. Upper mediastinal contours are within normal limits. Left-sided pacemaker device in position with lead tips projecting over the expected location of the right atrium and right ventricular apex. IMPRESSION: 1. Cardiomegaly with pulmonary venous congestion, but no frank pulmonary edema. Electronically Signed   By: Vinnie Langton M.D.   On: 05/08/2015 02:05   Ct Head Wo Contrast  05/08/2015  CLINICAL DATA:  Acute onset of left arm weakness and swelling. Initial encounter. EXAM: CT HEAD WITHOUT CONTRAST TECHNIQUE: Contiguous axial images were obtained from the base of the skull through the vertex without intravenous contrast. COMPARISON:  CT of the head performed 04/07/2010 FINDINGS: There is no evidence of  acute infarction, mass lesion, or intra- or extra-axial hemorrhage on CT. Prominence of the ventricles and sulci reflects mild cortical  volume loss. Scattered periventricular and subcortical white matter change likely reflects small vessel ischemic microangiopathy. The brainstem and fourth ventricle are within normal limits. The basal ganglia are unremarkable in appearance. The cerebral hemispheres demonstrate grossly normal gray-white differentiation. No mass effect or midline shift is seen. There is no evidence of fracture; visualized osseous structures are unremarkable in appearance. The orbits are within normal limits. The paranasal sinuses and mastoid air cells are well-aerated. No significant soft tissue abnormalities are seen. IMPRESSION: 1. No acute intracranial pathology seen on CT. 2. Mild cortical volume loss and scattered small vessel ischemic microangiopathy. Electronically Signed   By: Garald Balding M.D.   On: 05/08/2015 04:33   Assessment/Plan: 81 y/o with multiple medical problems, admitted to [facility] due to new onset 2 days ago of acute, severe left forearm pain associated with paresthesias-weakness and initial report of associated swelling and discoloration of the same extremity (no present on exam). Severe pain is significantly worsened by any movements as well as light touch (allodynia) which precludes muscle testing in the left UE proximally and distally. Pulses left UE are 2+. Overall pattern of pain seems to be neuropathic ( pain distribution, presence of allodynia), and it is so severe at this moment that compromises the neuro-exam. However, an acute entrapment neuropathy should be entertained ( can not make the OK sign, positive Froment's sign but unsure if pain is causing the weakness, acute interosseus nerve syndrome unlikely due to presence of significant paresthesias). Left brachial plexitis manifesting as a compressive neuropathy unlikely. Unfortunately, he can not have MRI brachial plexus and forearm due to pacemaker and EMG/NCS is not available in the hospital. Heparin as per vascular surgery while awaiting [place]  results. Trial of gabapentin. Will follow up.    Dorian Pod, MD 05/08/2015, 5:32 AM  [facility]

## 2015-05-09 ENCOUNTER — Encounter: Payer: Self-pay | Admitting: *Deleted

## 2015-05-09 ENCOUNTER — Encounter (HOSPITAL_COMMUNITY): Payer: Self-pay | Admitting: General Practice

## 2015-05-09 DIAGNOSIS — M79642 Pain in left hand: Secondary | ICD-10-CM | POA: Diagnosis not present

## 2015-05-09 DIAGNOSIS — M79641 Pain in right hand: Secondary | ICD-10-CM | POA: Diagnosis not present

## 2015-05-09 DIAGNOSIS — N179 Acute kidney failure, unspecified: Secondary | ICD-10-CM | POA: Diagnosis not present

## 2015-05-09 DIAGNOSIS — I10 Essential (primary) hypertension: Secondary | ICD-10-CM | POA: Diagnosis not present

## 2015-05-09 DIAGNOSIS — E1122 Type 2 diabetes mellitus with diabetic chronic kidney disease: Secondary | ICD-10-CM | POA: Diagnosis not present

## 2015-05-09 DIAGNOSIS — N184 Chronic kidney disease, stage 4 (severe): Secondary | ICD-10-CM | POA: Diagnosis not present

## 2015-05-09 LAB — GLUCOSE, CAPILLARY
GLUCOSE-CAPILLARY: 74 mg/dL (ref 65–99)
Glucose-Capillary: 84 mg/dL (ref 65–99)
Glucose-Capillary: 86 mg/dL (ref 65–99)
Glucose-Capillary: 76 mg/dL (ref 65–99)

## 2015-05-09 MED ORDER — ACETAMINOPHEN 325 MG PO TABS
650.0000 mg | ORAL_TABLET | Freq: Four times a day (QID) | ORAL | Status: DC | PRN
  Administered 2015-05-09: 650 mg via ORAL
  Filled 2015-05-09: qty 2

## 2015-05-09 MED ORDER — NAPHAZOLINE HCL 0.1 % OP SOLN
1.0000 [drp] | Freq: Four times a day (QID) | OPHTHALMIC | Status: DC | PRN
  Filled 2015-05-09: qty 15

## 2015-05-09 MED ORDER — PREGABALIN 75 MG PO CAPS
75.0000 mg | ORAL_CAPSULE | Freq: Two times a day (BID) | ORAL | Status: DC
  Administered 2015-05-09 (×2): 75 mg via ORAL
  Filled 2015-05-09 (×2): qty 1

## 2015-05-09 MED ORDER — NAPHAZOLINE-PHENIRAMINE 0.025-0.3 % OP SOLN
1.0000 [drp] | Freq: Four times a day (QID) | OPHTHALMIC | Status: DC | PRN
  Filled 2015-05-09: qty 5

## 2015-05-09 NOTE — Progress Notes (Signed)
Subjective: Patient reports that he continues to have pain in the LUE.  It is most severe in the left thumb where he rates the pain at a 7/10 and a 3-4/10 otherwise throughout.  On yesterday the pain began to involve the RUE.  Again the thumb is most involved but the entire arm hurts.  It is not as severe as the RUE.  Narcotics have not been useful for this pain.  No involvement of face or legs.    Objective: Current vital signs: BP 138/53 mmHg  Pulse 91  Temp(Src) 99.1 F (37.3 C) (Oral)  Resp 19  Ht 5\' 7"  (1.702 m)  Wt 100.245 kg (221 lb)  BMI 34.61 kg/m2  SpO2 93% Vital signs in last 24 hours: Temp:  [98.9 F (37.2 C)-99.1 F (37.3 C)] 99.1 F (37.3 C) (10/27 0447) Pulse Rate:  [59-91] 91 (10/27 0447) Resp:  [18-19] 19 (10/27 0447) BP: (138-158)/(50-53) 138/53 mmHg (10/27 0447) SpO2:  [93 %-95 %] 93 % (10/27 0447)  Intake/Output from previous day: 10/26 0701 - 10/27 0700 In: 480 [P.O.:480] Out: 1 [Urine:1] Intake/Output this shift: Total I/O In: 370 [P.O.:120; IV Piggyback:250] Out: -  Nutritional status: Diet heart healthy/carb modified Room service appropriate?: Yes; Fluid consistency:: Thin  Neurologic Exam: Mental Status: Alert, oriented, thought content appropriate. Speech fluent without evidence of aphasia. Able to follow 3 step commands without difficulty. Cranial Nerves: II: Discs flat bilaterally; Visual fields grossly normal, pupils equal, round, reactive to light and accommodation III,IV, VI: ptosis not present, extra-ocular motions intact bilaterally V,VII: smile symmetric, facial light touch sensation normal bilaterally VIII: hearing normal bilaterally IX,X: uvula rises symmetrically XI: bilateral shoulder shrug XII: midline tongue extension without atrophy or fasciculations Motor: Very weak grips bilaterally.  Able to lift both arms above his head but gives no resistance due to pain.     Lab Results: Basic Metabolic Panel:  Recent Labs Lab  05/07/15 2225  NA 137  K 4.5  CL 103  CO2 24  GLUCOSE 100*  BUN 34*  CREATININE 2.29*  CALCIUM 9.5    Liver Function Tests:  Recent Labs Lab 05/07/15 2225  AST 25  ALT 29  ALKPHOS 102  BILITOT 0.7  PROT 6.8  ALBUMIN 3.7   No results for input(s): LIPASE, AMYLASE in the last 168 hours. No results for input(s): AMMONIA in the last 168 hours.  CBC:  Recent Labs Lab 05/07/15 2225  WBC 12.2*  NEUTROABS 8.8*  HGB 13.0  HCT 38.7*  MCV 91.7  PLT 195    Cardiac Enzymes:  Recent Labs Lab 05/08/15 0546  CKTOTAL 95    Lipid Panel: No results for input(s): CHOL, TRIG, HDL, CHOLHDL, VLDL, LDLCALC in the last 168 hours.  CBG:  Recent Labs Lab 05/08/15 0613 05/08/15 1156 05/08/15 1606 05/08/15 2306 05/09/15 0644  GLUCAP 93 141* 145* 117* 84    Microbiology: No results found for this or any previous visit.  Coagulation Studies:  Recent Labs  05/08/15 0128  LABPROT 14.3  INR 1.09    Imaging: Dg Chest 2 View  05/08/2015  CLINICAL DATA:  81 year old male with left arm swelling and pain. EXAM: CHEST  2 VIEW COMPARISON:  Chest x-ray a 04/04/2015. FINDINGS: Lung volumes are normal. No consolidative airspace disease. No pleural effusions. Cephalization of the pulmonary vasculature, without frank pulmonary edema. Heart size is mildly enlarged. Upper mediastinal contours are within normal limits. Left-sided pacemaker device in position with lead tips projecting over the expected location of the  right atrium and right ventricular apex. IMPRESSION: 1. Cardiomegaly with pulmonary venous congestion, but no frank pulmonary edema. Electronically Signed   By: Vinnie Langton M.D.   On: 05/08/2015 02:05   Ct Head Wo Contrast  05/08/2015  CLINICAL DATA:  Acute onset of left arm weakness and swelling. Initial encounter. EXAM: CT HEAD WITHOUT CONTRAST TECHNIQUE: Contiguous axial images were obtained from the base of the skull through the vertex without intravenous  contrast. COMPARISON:  CT of the head performed 04/07/2010 FINDINGS: There is no evidence of acute infarction, mass lesion, or intra- or extra-axial hemorrhage on CT. Prominence of the ventricles and sulci reflects mild cortical volume loss. Scattered periventricular and subcortical white matter change likely reflects small vessel ischemic microangiopathy. The brainstem and fourth ventricle are within normal limits. The basal ganglia are unremarkable in appearance. The cerebral hemispheres demonstrate grossly normal gray-white differentiation. No mass effect or midline shift is seen. There is no evidence of fracture; visualized osseous structures are unremarkable in appearance. The orbits are within normal limits. The paranasal sinuses and mastoid air cells are well-aerated. No significant soft tissue abnormalities are seen. IMPRESSION: 1. No acute intracranial pathology seen on CT. 2. Mild cortical volume loss and scattered small vessel ischemic microangiopathy. Electronically Signed   By: Garald Balding M.D.   On: 05/08/2015 04:33   Ct Cervical Spine Wo Contrast  05/08/2015  CLINICAL DATA:  Bilateral upper extremity pain and radicular type symptoms. No known trauma EXAM: CT CERVICAL SPINE WITHOUT CONTRAST TECHNIQUE: Multidetector CT imaging of the cervical spine was performed without intravenous contrast. Multiplanar CT image reconstructions were also generated. COMPARISON:  None. FINDINGS: There is no fracture or spondylolisthesis. Prevertebral soft tissues and predental space regions are normal. There is calcification in the anterior ligament at C2-3, C5-6, C6-7, and C7-T1. There is mild disc space narrowing C4-5, C6-7, and C7-T1. At C2-3, there is slight facet hypertrophy bilaterally. No nerve root edema or effacement. No disc extrusion or stenosis. At C3-4, there is mild facet hypertrophy bilaterally. No nerve root edema or effacement. No disc extrusion or stenosis. There is slight central disc bulging. At  C4-5, there is mild facet osteoarthritic change bilaterally. There is slight exit foraminal narrowing due to bony hypertrophy on the right at this level. No nerve root edema or effacement. No disc extrusion or stenosis. At C5-6, there is moderate facet hypertrophy bilaterally. No nerve root edema or effacement. No disc extrusion or stenosis. At C6-7, there is mild facet hypertrophy bilaterally. There is mild central disc bulging. No nerve root edema or effacement. No disc extrusion or stenosis. At C7-T1, there is mild facet hypertrophy bilaterally. No nerve root edema or effacement. No disc extrusion or stenosis. There are foci of calcification in both carotid arteries. IMPRESSION: Multilevel osteoarthritic change. No nerve root edema or effacement. No disc extrusion or stenosis. No fracture or spondylolisthesis. Foci of calcification in each carotid artery noted. Electronically Signed   By: Lowella Grip III M.D.   On: 05/08/2015 18:33    Medications:  I have reviewed the patient's current medications. Scheduled: . aspirin EC  81 mg Oral Daily  . atorvastatin  40 mg Oral Daily  . carvedilol  12.5 mg Oral BID WC  . clopidogrel  75 mg Oral Q breakfast  . furosemide  40 mg Oral Daily  . hydrALAZINE  37.5 mg Oral BID  . insulin glargine  60 Units Subcutaneous Q2200  . isosorbide mononitrate  30 mg Oral Daily  . pantoprazole  40 mg Oral Daily  . sertraline  150 mg Oral Daily  . tamsulosin  0.4 mg Oral Daily  . terazosin  1 mg Oral QHS    Assessment/Plan: Patient continues to have pain now that has travelled from the left to the right.  Now reports neck pain as well.  CT of the cervical spine personally reviewed and shows some diffuse osteoarthritic changes.  No significant nerve root compression noted to explain pain.  MRI unable to be performed.    Recommendations: 1.  Lyrica 75mg  BID for pain control.  Do not expect narcotics to be useful with this type of pain. 2.  Since patient unable to  have MRI will attempt to schedule for a NCV/EMG (schedueld for Tuesday Nov.1 with Dr. Merlene Laughter in [place].  [facility] unable to perform NCV's at this time due to technical issues)  before considering myelogram if pain can be better controlled.      LOS: 1 day   Alexis Goodell, MD [facility] [phone] 05/09/2015  8:41 AM

## 2015-05-09 NOTE — Consult Note (Signed)
   [facility] Inpatient Consult   05/09/2015  Rodney Farley 10/01/1931 [id number]   Mr. Fleurimond has been active with [facility] program for [employer] employees/dependents with [employer]. Came to bedside to speak with him about [facility] visiting him for home visits. Explained that these services will not interfere or replace services provided by home health. Not sure what disposition plan will be at this time. He endorses he continues to have weakness. He lives with his wife at home. Reports he had issues with ambulation prior to admission however.  Consents signed for [facility] services again. Spoke with inpatient RNCM after bedside visit. Will request for patient to be assigned to [place].   Marthenia Rolling, MSN-Ed, RN,BSN [facility] Liaison [phone]

## 2015-05-09 NOTE — Evaluation (Signed)
Occupational Therapy Evaluation Patient Details Name: SKAI FARAR MRN: [id number] DOB: [birth year] Today's Date: 05/09/2015    History of Present Illness DAM BRUEGGEMAN is a 81 y.o. male, with a significant PMhx, who presents to the Emergency Department complaining of sudden onset, constant, progressively worsening left forearm pain onset 1 day ago with pain more predominantly in left hand. Pt has associated decreased grip strength and ROM in left hand and arm. He also associates more recent onset of pallor in left hand and possible mild swelling to his left hand. He was advised to present to the ED by his PCP for concern of DVT. His pain is worse with palpation or ROM of left arm. Pt is right-hand dominant. He endorses night sweats at baseline but denies any worsening or new diaphoresis. Also has left foot edema at baseline. PMH: CAD, MI, stents placed 04/16/15   Clinical Impression   Pt reports he required assist with LB dressing but was otherwise independent with ADLs PTA. Currently pt is max assist overall for ADLs. Pts pain and fatigue limiting participation in therapy this morning, B UE currently in the same amount of pain. Pt with decreased ROM and strength in B UE, significant pain with active and passive movement. Pt provided with built up utensil to aide with self-feeding; pt verbalized and demonstrated understanding. Pt plan to d/c home with intermittent supervision from his wife. Pt would benefit from continued skilled OT to maximize independence and safety with ADLs and functional mobility required for safe d/c home.      Follow Up Recommendations  Home health OT;Supervision - Intermittent    Equipment Recommendations  Other (comment) (TBD)    Recommendations for Other Services       Precautions / Restrictions Precautions Precautions: Fall Restrictions Weight Bearing Restrictions: No      Mobility Bed Mobility               General bed mobility comments:  Not tested at this time.  Transfers                 General transfer comment: Not tested at this time.    Balance                                            ADL Overall ADL's : Needs assistance/impaired Eating/Feeding: Maximal assistance;Bed level                                     General ADL Comments: No family present during OT eval. Pt declined OOB activities at this time due to pain and fatigue from not getting a lot of sleep last night. Educated pt on AE for increased independence with LB ADLs; pt receptive to use of AE. Provided pt with built up handle for utensils to aide in self feeding.       Vision     Perception     Praxis      Pertinent Vitals/Pain Pain Assessment: 0-10 Pain Score: 7  Pain Location: B hands/UE. R and L side equal in pain at this time Pain Descriptors / Indicators: Aching;Shooting;Sore Pain Intervention(s): Limited activity within patient's tolerance;Monitored during session     Hand Dominance Right   Extremity/Trunk Assessment Upper Extremity Assessment Upper Extremity Assessment: RUE deficits/detail;LUE deficits/detail RUE Deficits /  Details: Decreased finger, wrist, elbow AROM. Increased pain with finger flexion (3/4). Shoulder ROM WFL- pain and numbness in L UE with shoulder flexion  RUE Sensation:  (hypersensitivity in R hand) RUE Coordination: decreased fine motor;decreased gross motor LUE Deficits / Details: Decreased finger, wrist, elbow AROM. Increased pain with finger flexion (3/4), unable to achieve full AROM extension but PROM WFL. Shoulder ROM WFL- pain in R UE with shoulder flexion  LUE Sensation:  (hypersensitivity in L hand) LUE Coordination: decreased fine motor;decreased gross motor   Lower Extremity Assessment Lower Extremity Assessment: Defer to PT evaluation   Cervical / Trunk Assessment Cervical / Trunk Assessment: Kyphotic   Communication Communication Communication: No  difficulties   Cognition Arousal/Alertness: Awake/alert Behavior During Therapy: WFL for tasks assessed/performed Overall Cognitive Status: Within Functional Limits for tasks assessed                     General Comments       Exercises       Shoulder Instructions      Home Living Family/patient expects to be discharged to:: Private residence Living Arrangements: Spouse/significant other Available Help at Discharge: Family;Available PRN/intermittently Type of Home: House Home Access: Stairs to enter [employer] of Steps: 4 Entrance Stairs-Rails: Right Home Layout: One level     Bathroom Shower/Tub: Teacher, early years/pre: Standard     Home Equipment: Grab bars - tub/shower          Prior Functioning/Environment Level of Independence: Needs assistance    ADL's / Homemaking Assistance Needed: Pt reports he was having difficulty managing LB dressing PTA and required assist        OT Diagnosis: Generalized weakness;Acute pain   OT Problem List: Decreased strength;Decreased range of motion;Decreased activity tolerance;Decreased coordination;Decreased safety awareness;Decreased knowledge of use of DME or AE;Impaired sensation;Impaired UE functional use;Pain   OT Treatment/Interventions: Self-care/ADL training;Therapeutic exercise;DME and/or AE instruction;Patient/family education    OT Goals(Current goals can be found in the care plan section) Acute Rehab OT Goals Patient Stated Goal: figue out what's going on OT Goal Formulation: With patient Time For Goal Achievement: 05/23/15 Potential to Achieve Goals: Good ADL Goals Pt Will Perform Eating: with modified independence;with adaptive utensils;sitting Pt Will Perform Grooming: with adaptive equipment;standing;with min guard assist Pt Will Perform Upper Body Dressing: with min assist;sitting Pt Will Perform Lower Body Dressing: with min guard assist;with adaptive equipment;sit to/from  stand Pt Will Transfer to Toilet: with min guard assist;ambulating;bedside commode (BSC over toilet) Pt Will Perform Toileting - Clothing Manipulation and hygiene: with min guard assist;sit to/from stand Pt/caregiver will Perform Home Exercise Program: Increased ROM;Both right and left upper extremity;Increased strength;Independently  OT Frequency: Min 3X/week   Barriers to D/C: Decreased caregiver support (pt alone at times)          Co-evaluation              End of Session    Activity Tolerance: Patient limited by pain;Patient limited by fatigue Patient left: in bed;with call bell/phone within reach   Time: 0836-0856 OT Time Calculation (min): 20 min Charges:  OT General Charges $OT Visit: 1 Procedure OT Evaluation $Initial OT Evaluation Tier I: 1 Procedure G-Codes: OT G-codes **NOT FOR INPATIENT CLASS** Functional Assessment Tool Used: Clinical judgement Functional Limitation: Self care Self Care Current Status [id number]): At least 60 percent but less than 80 percent impaired, limited or restricted Self Care Goal Status [id number]): At least 40 percent but less than 60 percent  impaired, limited or restricted   Binnie Kand M.S., OTR/L Pager: [phone]  05/09/2015, 9:16 AM

## 2015-05-09 NOTE — Progress Notes (Signed)
PROGRESS NOTE  Rodney Farley [id number] DOB: 09/02/1931 DOA: 05/07/2015 PCP: [name], MD  Assessment/Plan: Left hand pain:  This is new.  -Uric acid ok -ESR 40 -CK ok -neurology: NCV/EMG- schedueld for Tuesday Nov.1 with Dr. Merlene Laughter in [place] started 10/27 -dvt negative  CAD:  Clinically stable. -Continue home statin, beta blocker, hydralazine, Imdur, terazosin and aspirin  Chronic systolic CHF:  Clinically stable. -Continue home furosemide, beta blocker, hydralazine, Imdur  IDDM:  Continue home insulin  BPH:  Stable.  -Continue Flomax  Depression:  Stable.  -Continue sertraline   Code Status: full Family Communication:  Disposition Plan: home health   Consultants:    Procedures:    HPI/Subjective: Still with pain  Objective: Filed Vitals:   05/09/15 1300  BP: 118/45  Pulse: 61  Temp: 98.4 F (36.9 C)  Resp: 16    Intake/Output Summary (Last 24 hours) at 05/09/15 1348 Last data filed at 05/09/15 1300  Gross per 24 hour  Intake   1090 ml  Output      1 ml  Net   1089 ml   Filed Weights   05/07/15 2143  Weight: 100.245 kg (221 lb)    Exam:   General:  Awake, NAD  Cardiovascular: rrr  Respiratory: clear  Abdomen: +BS, soft  Musculoskeletal: pulses intact  Data Reviewed: Basic Metabolic Panel:  Recent Labs Lab 05/07/15 2225  NA 137  K 4.5  CL 103  CO2 24  GLUCOSE 100*  BUN 34*  CREATININE 2.29*  CALCIUM 9.5   Liver Function Tests:  Recent Labs Lab 05/07/15 2225  AST 25  ALT 29  ALKPHOS 102  BILITOT 0.7  PROT 6.8  ALBUMIN 3.7   No results for input(s): LIPASE, AMYLASE in the last 168 hours. No results for input(s): AMMONIA in the last 168 hours. CBC:  Recent Labs Lab 05/07/15 2225  WBC 12.2*  NEUTROABS 8.8*  HGB 13.0  HCT 38.7*  MCV 91.7  PLT 195   Cardiac Enzymes:  Recent Labs Lab 05/08/15 0546  CKTOTAL 95   BNP (last 3 results) No results for  input(s): BNP in the last 8760 hours.  ProBNP (last 3 results) No results for input(s): PROBNP in the last 8760 hours.  CBG:  Recent Labs Lab 05/08/15 1156 05/08/15 1606 05/08/15 2306 05/09/15 0644 05/09/15 1105  GLUCAP 141* 145* 117* 84 86    No results found for this or any previous visit (from the past 240 hour(s)).   Studies: Dg Chest 2 View  05/08/2015  CLINICAL DATA:  81 year old male with left arm swelling and pain. EXAM: CHEST  2 VIEW COMPARISON:  Chest x-ray a 04/04/2015. FINDINGS: Lung volumes are normal. No consolidative airspace disease. No pleural effusions. Cephalization of the pulmonary vasculature, without frank pulmonary edema. Heart size is mildly enlarged. Upper mediastinal contours are within normal limits. Left-sided pacemaker device in position with lead tips projecting over the expected location of the right atrium and right ventricular apex. IMPRESSION: 1. Cardiomegaly with pulmonary venous congestion, but no frank pulmonary edema. Electronically Signed   By: Vinnie Langton M.D.   On: 05/08/2015 02:05   Ct Head Wo Contrast  05/08/2015  CLINICAL DATA:  Acute onset of left arm weakness and swelling. Initial encounter. EXAM: CT HEAD WITHOUT CONTRAST TECHNIQUE: Contiguous axial images were obtained from the base of the skull through the vertex without intravenous contrast. COMPARISON:  CT of the head performed 04/07/2010 FINDINGS: There is no evidence of acute infarction, mass  lesion, or intra- or extra-axial hemorrhage on CT. Prominence of the ventricles and sulci reflects mild cortical volume loss. Scattered periventricular and subcortical white matter change likely reflects small vessel ischemic microangiopathy. The brainstem and fourth ventricle are within normal limits. The basal ganglia are unremarkable in appearance. The cerebral hemispheres demonstrate grossly normal gray-white differentiation. No mass effect or midline shift is seen. There is no evidence of  fracture; visualized osseous structures are unremarkable in appearance. The orbits are within normal limits. The paranasal sinuses and mastoid air cells are well-aerated. No significant soft tissue abnormalities are seen. IMPRESSION: 1. No acute intracranial pathology seen on CT. 2. Mild cortical volume loss and scattered small vessel ischemic microangiopathy. Electronically Signed   By: Garald Balding M.D.   On: 05/08/2015 04:33   Ct Cervical Spine Wo Contrast  05/08/2015  CLINICAL DATA:  Bilateral upper extremity pain and radicular type symptoms. No known trauma EXAM: CT CERVICAL SPINE WITHOUT CONTRAST TECHNIQUE: Multidetector CT imaging of the cervical spine was performed without intravenous contrast. Multiplanar CT image reconstructions were also generated. COMPARISON:  None. FINDINGS: There is no fracture or spondylolisthesis. Prevertebral soft tissues and predental space regions are normal. There is calcification in the anterior ligament at C2-3, C5-6, C6-7, and C7-T1. There is mild disc space narrowing C4-5, C6-7, and C7-T1. At C2-3, there is slight facet hypertrophy bilaterally. No nerve root edema or effacement. No disc extrusion or stenosis. At C3-4, there is mild facet hypertrophy bilaterally. No nerve root edema or effacement. No disc extrusion or stenosis. There is slight central disc bulging. At C4-5, there is mild facet osteoarthritic change bilaterally. There is slight exit foraminal narrowing due to bony hypertrophy on the right at this level. No nerve root edema or effacement. No disc extrusion or stenosis. At C5-6, there is moderate facet hypertrophy bilaterally. No nerve root edema or effacement. No disc extrusion or stenosis. At C6-7, there is mild facet hypertrophy bilaterally. There is mild central disc bulging. No nerve root edema or effacement. No disc extrusion or stenosis. At C7-T1, there is mild facet hypertrophy bilaterally. No nerve root edema or effacement. No disc extrusion or  stenosis. There are foci of calcification in both carotid arteries. IMPRESSION: Multilevel osteoarthritic change. No nerve root edema or effacement. No disc extrusion or stenosis. No fracture or spondylolisthesis. Foci of calcification in each carotid artery noted. Electronically Signed   By: Lowella Grip III M.D.   On: 05/08/2015 18:33    Scheduled Meds: . aspirin EC  81 mg Oral Daily  . atorvastatin  40 mg Oral Daily  . carvedilol  12.5 mg Oral BID WC  . clopidogrel  75 mg Oral Q breakfast  . furosemide  40 mg Oral Daily  . hydrALAZINE  37.5 mg Oral BID  . insulin glargine  60 Units Subcutaneous Q2200  . isosorbide mononitrate  30 mg Oral Daily  . pantoprazole  40 mg Oral Daily  . pregabalin  75 mg Oral BID  . sertraline  150 mg Oral Daily  . tamsulosin  0.4 mg Oral Daily  . terazosin  1 mg Oral QHS   Continuous Infusions:  Antibiotics Given (last 72 hours)    None      Principal Problem:   Left hand pain Active Problems:   Atrioventricular block, complete (HCC)   Obesity   Pacemaker   Diabetes type 2, uncontrolled (HCC)   HTN (hypertension)   CAD (coronary artery disease), native coronary artery, in all vessels, significant  CKD (chronic kidney disease) stage 4, GFR 15-29 ml/min (HCC)   Chronic systolic CHF (congestive heart failure) (HCC)   Left arm pain    Time spent: 25 min    Yoana Staib  [facility] Pager [phone]. If 7PM-7AM, please contact night-coverage at [email or web address], password [facility] 05/09/2015, 1:48 PM  LOS: 1 day

## 2015-05-09 NOTE — Patient Outreach (Signed)
[facility]) Care Management  05/09/2015  Rodney Farley 12/08/1931 [id number]   Referred to Raina Mina RN for Community Case Management.

## 2015-05-09 NOTE — Progress Notes (Signed)
Physical Therapy Treatment Patient Details Name: Rodney Farley MRN: [id number] DOB: [birth year] Today's Date: 05/09/2015    History of Present Illness AMAD Farley is a 81 y.o. male, with a significant PMhx, who presents to the Emergency Department complaining of sudden onset, constant, progressively worsening left forearm pain onset 1 day ago with pain more predominantly in left hand. Pt has associated decreased grip strength and ROM in left hand and arm. He also associates more recent onset of pallor in left hand and possible mild swelling to his left hand. He was advised to present to the ED by his PCP for concern of DVT. His pain is worse with palpation or ROM of left arm. Pt is right-hand dominant. He endorses night sweats at baseline but denies any worsening or new diaphoresis. Also has left foot edema at baseline. PMH: CAD, MI, stents placed 04/16/15    PT Comments    Pt had just walked from bathroom to recliner by himself. Encouraged pt to use call bell to ask for assistance for walking as he is a high fall risk. He reported increased pain today and was only able to tolerate recliner to bed transfer with min A. Mod assist of 2 for sit to supine. May need ST-SNF and WC if mobility remains limited.    Follow Up Recommendations  Home health PT vs. ST-SNF.      Equipment Recommendations  Other (comment) (tbd) ? WC   Recommendations for Other Services OT consult     Precautions / Restrictions Precautions Precautions: Fall Restrictions Weight Bearing Restrictions: No    Mobility  Bed Mobility Overal bed mobility: Needs Assistance Bed Mobility: Sit to Supine     Supine to sit: Mod assist;+2 for physical assistance     General bed mobility comments: +2 to control descent of trunk and to bring BLEs up into bed  Transfers Overall transfer level: Needs assistance Equipment used: Rolling walker (2 wheeled) Transfers: Sit to/from [facility] Sit to  Stand: Min assist Stand pivot transfers: Min assist       General transfer comment: min A for power up without use of pt's hands, min A also to steady as well as control descent into sitting  Ambulation/Gait Ambulation/Gait assistance: Min assist Ambulation Distance (Feet): 2 Feet Assistive device: 1 person hand held assist Gait Pattern/deviations: Step-to pattern Gait velocity: decreased   General Gait Details: pt reported he's not able to tolerate walking to day due to increased pain, min HHA to take a few steps from recliner to bed   Stairs            Wheelchair Mobility    Modified Rankin (Stroke Patients Only) Modified Rankin (Stroke Patients Only) Pre-Morbid Rankin Score: Moderate disability Modified Rankin: Moderately severe disability     Balance Overall balance assessment: Needs assistance   Sitting balance-Leahy Scale: Good       Standing balance-Leahy Scale: Poor                      Cognition Arousal/Alertness: Awake/alert Behavior During Therapy: WFL for tasks assessed/performed Overall Cognitive Status: Within Functional Limits for tasks assessed                      Exercises      General Comments        Pertinent Vitals/Pain Pain Assessment: 0-10 Pain Score: 7  Pain Location: B hands Pain Descriptors / Indicators: Aching Pain Intervention(s): Limited activity within  patient's tolerance;Monitored during session    Home Living Family/patient expects to be discharged to:: Private residence Living Arrangements: Spouse/significant other Available Help at Discharge: Family;Available PRN/intermittently Type of Home: House Home Access: Stairs to enter Entrance Stairs-Rails: Right Home Layout: One level Home Equipment: Grab bars - tub/shower      Prior Function Level of Independence: Needs assistance    ADL's / Homemaking Assistance Needed: Pt reports he was having difficulty managing LB dressing PTA and required  assist     PT Goals (current goals can now be found in the care plan section) Acute Rehab PT Goals Patient Stated Goal: figue out what's going on PT Goal Formulation: With patient Time For Goal Achievement: 05/22/15 Potential to Achieve Goals: Good Progress towards PT goals: Not progressing toward goals - comment (pain limiting progress)    Frequency  Min 3X/week    PT Plan Current plan remains appropriate    Co-evaluation             End of Session Equipment Utilized During Treatment: Gait belt Activity Tolerance: Patient limited by pain Patient left: with call bell/phone within reach;in bed;with bed alarm set     Time: 1144-1159 PT Time Calculation (min) (ACUTE ONLY): 15 min  Charges:  $Therapeutic Activity: 8-22 mins                    G Codes:      Philomena Doheny 05/09/2015, 12:05 PM [phone]

## 2015-05-10 DIAGNOSIS — N179 Acute kidney failure, unspecified: Secondary | ICD-10-CM | POA: Diagnosis not present

## 2015-05-10 DIAGNOSIS — M79642 Pain in left hand: Secondary | ICD-10-CM | POA: Diagnosis not present

## 2015-05-10 DIAGNOSIS — N184 Chronic kidney disease, stage 4 (severe): Secondary | ICD-10-CM | POA: Diagnosis not present

## 2015-05-10 DIAGNOSIS — E1122 Type 2 diabetes mellitus with diabetic chronic kidney disease: Secondary | ICD-10-CM | POA: Diagnosis not present

## 2015-05-10 LAB — GLUCOSE, CAPILLARY
GLUCOSE-CAPILLARY: 73 mg/dL (ref 65–99)
GLUCOSE-CAPILLARY: 187 mg/dL — AB (ref 65–99)
Glucose-Capillary: 133 mg/dL — ABNORMAL HIGH (ref 65–99)
Glucose-Capillary: 253 mg/dL — ABNORMAL HIGH (ref 65–99)

## 2015-05-10 LAB — URINE MICROSCOPIC-ADD ON

## 2015-05-10 LAB — URINALYSIS, ROUTINE W REFLEX MICROSCOPIC
BILIRUBIN URINE: NEGATIVE
GLUCOSE, UA: NEGATIVE mg/dL
HGB URINE DIPSTICK: NEGATIVE
Ketones, ur: NEGATIVE mg/dL
Nitrite: NEGATIVE
PH: 5 (ref 5.0–8.0)
Protein, ur: 100 mg/dL — AB
SPECIFIC GRAVITY, URINE: 1.016 (ref 1.005–1.030)
UROBILINOGEN UA: 0.2 mg/dL (ref 0.0–1.0)

## 2015-05-10 LAB — BASIC METABOLIC PANEL
ANION GAP: 13 (ref 5–15)
BUN: 60 mg/dL — ABNORMAL HIGH (ref 6–20)
CALCIUM: 9.3 mg/dL (ref 8.9–10.3)
CO2: 20 mmol/L — AB (ref 22–32)
CREATININE: 3.63 mg/dL — AB (ref 0.61–1.24)
Chloride: 102 mmol/L (ref 101–111)
GFR calc Af Amer: 17 mL/min — ABNORMAL LOW (ref >60)
GFR calc non Af Amer: 14 mL/min — ABNORMAL LOW (ref >60)
GLUCOSE: 81 mg/dL (ref 65–99)
Potassium: 4.6 mmol/L (ref 3.5–5.1)
Sodium: 135 mmol/L (ref 135–145)

## 2015-05-10 LAB — CBC
HEMATOCRIT: 32.4 % — AB (ref 39.0–52.0)
Hemoglobin: 10.7 g/dL — ABNORMAL LOW (ref 13.0–17.0)
MCH: 30.1 pg (ref 26.0–34.0)
MCHC: 33 g/dL (ref 30.0–36.0)
MCV: 91.3 fL (ref 78.0–100.0)
Platelets: 187 10*3/uL (ref 150–400)
RBC: 3.55 MIL/uL — ABNORMAL LOW (ref 4.22–5.81)
RDW: 14.1 % (ref 11.5–15.5)
WBC: 15 10*3/uL — ABNORMAL HIGH (ref 4.0–10.5)

## 2015-05-10 LAB — SEDIMENTATION RATE: Sed Rate: 97 mm/hr — ABNORMAL HIGH (ref 0–16)

## 2015-05-10 LAB — HEMOGLOBIN A1C
Hgb A1c MFr Bld: 7.4 % — ABNORMAL HIGH (ref 4.8–5.6)
Mean Plasma Glucose: 166 mg/dL

## 2015-05-10 LAB — URIC ACID: Uric Acid, Serum: 9.3 mg/dL — ABNORMAL HIGH (ref 4.4–7.6)

## 2015-05-10 MED ORDER — METHYLPREDNISOLONE SODIUM SUCC 125 MG IJ SOLR
125.0000 mg | Freq: Two times a day (BID) | INTRAMUSCULAR | Status: DC
  Administered 2015-05-10 (×2): 125 mg via INTRAVENOUS
  Filled 2015-05-10 (×3): qty 2

## 2015-05-10 MED ORDER — SODIUM CHLORIDE 0.9 % IV SOLN
INTRAVENOUS | Status: DC
  Administered 2015-05-10 (×2): via INTRAVENOUS

## 2015-05-10 MED ORDER — CAPSAICIN 0.025 % EX CREA
TOPICAL_CREAM | Freq: Two times a day (BID) | CUTANEOUS | Status: DC
  Administered 2015-05-10 (×2): via TOPICAL
  Filled 2015-05-10: qty 56.6

## 2015-05-10 MED ORDER — ALBUTEROL SULFATE (2.5 MG/3ML) 0.083% IN NEBU
2.5000 mg | INHALATION_SOLUTION | RESPIRATORY_TRACT | Status: DC | PRN
  Administered 2015-05-11: 2.5 mg via RESPIRATORY_TRACT
  Filled 2015-05-10: qty 3

## 2015-05-10 MED ORDER — INSULIN ASPART 100 UNIT/ML ~~LOC~~ SOLN
0.0000 [IU] | Freq: Every day | SUBCUTANEOUS | Status: DC
  Administered 2015-05-10: 3 [IU] via SUBCUTANEOUS

## 2015-05-10 MED ORDER — INSULIN ASPART 100 UNIT/ML ~~LOC~~ SOLN
0.0000 [IU] | Freq: Three times a day (TID) | SUBCUTANEOUS | Status: DC
  Administered 2015-05-10 – 2015-05-11 (×2): 3 [IU] via SUBCUTANEOUS
  Administered 2015-05-11: 5 [IU] via SUBCUTANEOUS
  Administered 2015-05-11: 3 [IU] via SUBCUTANEOUS

## 2015-05-10 NOTE — Progress Notes (Signed)
Subjective: Patient reports that he is worse than yesterday.  Today the pain is involving the RLE as well.  It continues in both upper extremities and has worsened in the shoulders.  He also reports hallucinations and some confusion.  Unclear if this may be related to medications.      Objective: Current vital signs: BP 149/66 mmHg  Pulse 66  Temp(Src) 98.1 F (36.7 C) (Oral)  Resp 18  Ht _0  (1.702 m)  Wt 100.245 kg (221 lb)  BMI 34.61 kg/m2  SpO2 92% Vital signs in last 24 hours: Temp:  [98.1 F (36.7 C)-98.4 F (36.9 C)] 98.1 F (36.7 C) (10/28 0610) Pulse Rate:  [59-66] 66 (10/28 0610) Resp:  [16-18] 18 (10/28 0610) BP: (118-149)/(45-66) 149/66 mmHg (10/28 0610) SpO2:  [92 %-95 %] 92 % (10/28 0610)  Intake/Output from previous day: 10/27 0701 - 10/28 0700 In: 850 [P.O.:600; IV Piggyback:250] Out: 351 [Urine:351] Intake/Output this shift:   Nutritional status: Diet heart healthy/carb modified Room service appropriate?: Yes; Fluid consistency:: Thin  Neurologic Exam: Mental Status: Alert, oriented, thought content appropriate. Speech fluent without evidence of aphasia. Able to follow 3 step commands without difficulty. Cranial Nerves: II: Discs flat bilaterally; Visual fields grossly normal, pupils equal, round, reactive to light and accommodation III,IV, VI: ptosis not present, extra-ocular motions intact bilaterally V,VII: smile symmetric, facial light touch sensation normal bilaterally VIII: hearing normal bilaterally IX,X: uvula rises symmetrically XI: bilateral shoulder shrug XII: midline tongue extension without atrophy or fasciculations Motor: Very weak grips bilaterally. Able to lift both arms above his head but gives no resistance due to pain. Able to lift both lower extremities but reports radiating pain with minimal movement.     Lab Results: Basic Metabolic Panel:  Recent Labs Lab 05/07/15 2225 05/10/15 0504  NA 137 135  K 4.5 4.6  CL 103 102   CO2 24 20*  GLUCOSE 100* 81  BUN 34* 60*  CREATININE 2.29* 3.63*  CALCIUM 9.5 9.3    Liver Function Tests:  Recent Labs Lab 05/07/15 2225  AST 25  ALT 29  ALKPHOS 102  BILITOT 0.7  PROT 6.8  ALBUMIN 3.7   No results for input(s): LIPASE, AMYLASE in the last 168 hours. No results for input(s): AMMONIA in the last 168 hours.  CBC:  Recent Labs Lab 05/07/15 2225 05/10/15 0504  WBC 12.2* 15.0*  NEUTROABS 8.8*  --   HGB 13.0 10.7*  HCT 38.7* 32.4*  MCV 91.7 91.3  PLT 195 187    Cardiac Enzymes:  Recent Labs Lab 05/08/15 0546  CKTOTAL 95    Lipid Panel: No results for input(s): CHOL, TRIG, HDL, CHOLHDL, VLDL, LDLCALC in the last 168 hours.  CBG:  Recent Labs Lab 05/09/15 0644 05/09/15 1105 05/09/15 1611 05/09/15 2128 05/10/15 0609  GLUCAP 84 86 74 76 73    Microbiology: No results found for this or any previous visit.  Coagulation Studies:  Recent Labs  05/08/15 0128  LABPROT 14.3  INR 1.09    Imaging: Ct Cervical Spine Wo Contrast  05/08/2015  CLINICAL DATA:  Bilateral upper extremity pain and radicular type symptoms. No known trauma EXAM: CT CERVICAL SPINE WITHOUT CONTRAST TECHNIQUE: Multidetector CT imaging of the cervical spine was performed without intravenous contrast. Multiplanar CT image reconstructions were also generated. COMPARISON:  None. FINDINGS: There is no fracture or spondylolisthesis. Prevertebral soft tissues and predental space regions are normal. There is calcification in the anterior ligament at C2-3, C5-6, C6-7, and C7-T1. There  is mild disc space narrowing C4-5, C6-7, and C7-T1. At C2-3, there is slight facet hypertrophy bilaterally. No nerve root edema or effacement. No disc extrusion or stenosis. At C3-4, there is mild facet hypertrophy bilaterally. No nerve root edema or effacement. No disc extrusion or stenosis. There is slight central disc bulging. At C4-5, there is mild facet osteoarthritic change bilaterally. There  is slight exit foraminal narrowing due to bony hypertrophy on the right at this level. No nerve root edema or effacement. No disc extrusion or stenosis. At C5-6, there is moderate facet hypertrophy bilaterally. No nerve root edema or effacement. No disc extrusion or stenosis. At C6-7, there is mild facet hypertrophy bilaterally. There is mild central disc bulging. No nerve root edema or effacement. No disc extrusion or stenosis. At C7-T1, there is mild facet hypertrophy bilaterally. No nerve root edema or effacement. No disc extrusion or stenosis. There are foci of calcification in both carotid arteries. IMPRESSION: Multilevel osteoarthritic change. No nerve root edema or effacement. No disc extrusion or stenosis. No fracture or spondylolisthesis. Foci of calcification in each carotid artery noted. Electronically Signed   By: Lowella Grip III M.D.   On: 05/08/2015 18:33    Medications:  I have reviewed the patient's current medications. Scheduled: . aspirin EC  81 mg Oral Daily  . atorvastatin  40 mg Oral Daily  . carvedilol  12.5 mg Oral BID WC  . clopidogrel  75 mg Oral Q breakfast  . hydrALAZINE  37.5 mg Oral BID  . insulin glargine  60 Units Subcutaneous Q2200  . isosorbide mononitrate  30 mg Oral Daily  . pantoprazole  40 mg Oral Daily  . pregabalin  75 mg Oral BID  . sertraline  150 mg Oral Daily  . tamsulosin  0.4 mg Oral Daily  . terazosin  1 mg Oral QHS    Assessment/Plan: Patient worsening.  Limited on further imaging studies due to pacer and worsening renal function.  Concerned about spinal cord with progression of symptoms.  Uric acid high normal.  ESR elevated.    Recommendations: 1.  Repeat ESR and UA 2.  Solumedrol 146m IV q 12hours for 48 hours.  Insulin coverage will be required.   3.  D/C Lyrica.  May be causing hallucinations 4.  Capsacin cream to upper extremity most painful areas.    Case discussed with Dr. VEliseo Squires   LOS: 2 days   LAlexis Goodell MD [facility] [phone]/28/2016  8:44 AM

## 2015-05-10 NOTE — Progress Notes (Signed)
Physical Therapy Treatment Patient Details Name: Rodney Farley MRN: [id number] DOB: 1932-03-13 Today's Date: 05/10/2015    History of Present Illness Rodney Farley is a 81 y.o. male, with a significant PMhx, who presents to the Emergency Department complaining of sudden onset, constant, progressively worsening left forearm pain onset 1 day ago with pain more predominantly in left hand. Pt has associated decreased grip strength and ROM in left hand and arm. He also associates more recent onset of pallor in left hand and possible mild swelling to his left hand. He was advised to present to the ED by his PCP for concern of DVT. His pain is worse with palpation or ROM of left arm. Pt is right-hand dominant. He endorses night sweats at baseline but denies any worsening or new diaphoresis. Also has left foot edema at baseline. PMH: CAD, MI, stents placed 04/16/15    PT Comments    Pt with significant decline in functional mobility. On eval, pt ambulated in hallway HHA. He is currently +2 max assist for bed mobilty and transfers. He demo decline in overall strength, c/o increased pain BUE/LE, and decline in activity tolerance, exhibiting DOE with transfers. Follow up recommendations updated to SNF. Pt is scheduled to undergo further testing. PT to modify POC as needed.  Follow Up Recommendations  SNF;Supervision/Assistance - 24 hour     Equipment Recommendations       Recommendations for Other Services       Precautions / Restrictions Precautions Precautions: Fall Restrictions Weight Bearing Restrictions: No    Mobility  Bed Mobility           Sit to supine: +2 for physical assistance;Max assist      Transfers   Equipment used: None   Sit to Stand: +2 physical assistance;Max assist Stand pivot transfers: +2 physical assistance;Max assist       General transfer comment: verbal cues throughout for sequencing  Ambulation/Gait             General Gait Details:  unable   Stairs            Wheelchair Mobility    Modified Rankin (Stroke Patients Only) Modified Rankin (Stroke Patients Only) Pre-Morbid Rankin Score: Moderate disability Modified Rankin: Moderately severe disability     Balance                                    Cognition Arousal/Alertness: Awake/alert Behavior During Therapy: WFL for tasks assessed/performed Overall Cognitive Status: Within Functional Limits for tasks assessed       Memory: Decreased short-term memory              Exercises      General Comments        Pertinent Vitals/Pain Pain Score: 7  Pain Location: "all over" Pain Descriptors / Indicators: Aching Pain Intervention(s): Limited activity within patient's tolerance;Monitored during session    Home Living                      Prior Function            PT Goals (current goals can now be found in the care plan section) Acute Rehab PT Goals Patient Stated Goal: figue out what's going on PT Goal Formulation: With patient Time For Goal Achievement: 05/22/15 Potential to Achieve Goals: Good Progress towards PT goals: Not progressing toward goals - comment (decline in strength  and med status)    Frequency  Min 3X/week    PT Plan Discharge plan needs to be updated    Co-evaluation             End of Session Equipment Utilized During Treatment: Gait belt Activity Tolerance: Patient limited by fatigue;Patient limited by pain Patient left: in bed;with call bell/phone within reach;with family/visitor present     Time: [id number] PT Time Calculation (min) (ACUTE ONLY): 24 min  Charges:  $Therapeutic Activity: 23-37 mins                    G Codes:  Functional Assessment Tool Used: clinical judgement Functional Limitation: Mobility: Walking and moving around Mobility: Walking and Moving Around Current Status [id number]): At least 80 percent but less than 100 percent impaired, limited or  restricted Mobility: Walking and Moving Around Goal Status [id number]): At least 1 percent but less than 20 percent impaired, limited or restricted   Lorriane Shire 05/10/2015, 11:47 AM

## 2015-05-10 NOTE — Progress Notes (Addendum)
PROGRESS NOTE  Rodney Farley [id number] DOB: 01/03/1932 DOA: 05/07/2015 PCP: [name], MD  patient was in his usual state of health until Monday when he had slow onset of left hand pain over the course of the afternoon. This pain is located in the left first MCP joint and dorsum of the left hand, it is excruciating with light touch, and is preventing him from sleeping. He thinks he notes slight swelling in the left wrist and weakness. The patient went to his PCP who is suspicious of left upper extremity DVT and sent the patient to the ER.  DVT was ruled out.  Condition continued to worsen and moved into all 4 extremities Unable to have MRI due to pacer but investigating if it could be done via Dr. Caryl Comes  ADDENDUM: Patient has a pacemaker that is not compatible with out MRI machine so will be unable to have MRI per radiology  Assessment/Plan: Left hand pain:  This is new and worsening- moved from left hand to right hand then legs -Uric acid ok- recheck  -ESR 40-recheck -CK ok -neurology: NCV/EMG- schedueld for Tuesday Nov.1 with Dr. Merlene Laughter in [place] -steroids added 10/28 -lyrica started 10/27 but d/c'd as patient says he was hallucinating -dvt work up negative - need MRI-- asked Dr. Caryl Comes to see if we could as patient has pacemaker- he is looking into this  CAD:  Clinically stable. -Continue home statin, beta blocker, hydralazine, Imdur, terazosin and aspirin  AKI on CKD Hold lasix IVF  Chronic systolic CHF: EF 28-41 3244 Clinically stable. - beta blocker, hydralazine, Imdur  IDDM:  Continue home insulin -SSI  BPH:  Stable.  -Continue Flomax  Depression:  Stable.  -Continue sertraline   Code Status: full Family Communication: son at bedside Disposition Plan: SNF   Consultants:    Procedures:    HPI/Subjective: Still with pain  Objective: Filed Vitals:   05/10/15 0610  BP: 149/66  Pulse: 66  Temp: 98.1 F (36.7 C)    Resp: 18    Intake/Output Summary (Last 24 hours) at 05/10/15 1311 Last data filed at 05/10/15 1100  Gross per 24 hour  Intake    240 ml  Output    750 ml  Net   -510 ml   Filed Weights   05/07/15 2143  Weight: 100.245 kg (221 lb)    Exam:   General:  Awake, NAD  Cardiovascular: rrr  Respiratory: clear  Abdomen: +BS, soft  Musculoskeletal: pulses intact  Data Reviewed: Basic Metabolic Panel:  Recent Labs Lab 05/07/15 2225 05/10/15 0504  NA 137 135  K 4.5 4.6  CL 103 102  CO2 24 20*  GLUCOSE 100* 81  BUN 34* 60*  CREATININE 2.29* 3.63*  CALCIUM 9.5 9.3   Liver Function Tests:  Recent Labs Lab 05/07/15 2225  AST 25  ALT 29  ALKPHOS 102  BILITOT 0.7  PROT 6.8  ALBUMIN 3.7   No results for input(s): LIPASE, AMYLASE in the last 168 hours. No results for input(s): AMMONIA in the last 168 hours. CBC:  Recent Labs Lab 05/07/15 2225 05/10/15 0504  WBC 12.2* 15.0*  NEUTROABS 8.8*  --   HGB 13.0 10.7*  HCT 38.7* 32.4*  MCV 91.7 91.3  PLT 195 187   Cardiac Enzymes:  Recent Labs Lab 05/08/15 0546  CKTOTAL 95   BNP (last 3 results) No results for input(s): BNP in the last 8760 hours.  ProBNP (last 3 results) No results for input(s): PROBNP in the  last 8760 hours.  CBG:  Recent Labs Lab 05/09/15 1105 05/09/15 1611 05/09/15 2128 05/10/15 0609 05/10/15 1118  GLUCAP 86 74 76 73 133*    No results found for this or any previous visit (from the past 240 hour(s)).   Studies: Ct Cervical Spine Wo Contrast  05/08/2015  CLINICAL DATA:  Bilateral upper extremity pain and radicular type symptoms. No known trauma EXAM: CT CERVICAL SPINE WITHOUT CONTRAST TECHNIQUE: Multidetector CT imaging of the cervical spine was performed without intravenous contrast. Multiplanar CT image reconstructions were also generated. COMPARISON:  None. FINDINGS: There is no fracture or spondylolisthesis. Prevertebral soft tissues and predental space regions are  normal. There is calcification in the anterior ligament at C2-3, C5-6, C6-7, and C7-T1. There is mild disc space narrowing C4-5, C6-7, and C7-T1. At C2-3, there is slight facet hypertrophy bilaterally. No nerve root edema or effacement. No disc extrusion or stenosis. At C3-4, there is mild facet hypertrophy bilaterally. No nerve root edema or effacement. No disc extrusion or stenosis. There is slight central disc bulging. At C4-5, there is mild facet osteoarthritic change bilaterally. There is slight exit foraminal narrowing due to bony hypertrophy on the right at this level. No nerve root edema or effacement. No disc extrusion or stenosis. At C5-6, there is moderate facet hypertrophy bilaterally. No nerve root edema or effacement. No disc extrusion or stenosis. At C6-7, there is mild facet hypertrophy bilaterally. There is mild central disc bulging. No nerve root edema or effacement. No disc extrusion or stenosis. At C7-T1, there is mild facet hypertrophy bilaterally. No nerve root edema or effacement. No disc extrusion or stenosis. There are foci of calcification in both carotid arteries. IMPRESSION: Multilevel osteoarthritic change. No nerve root edema or effacement. No disc extrusion or stenosis. No fracture or spondylolisthesis. Foci of calcification in each carotid artery noted. Electronically Signed   By: Lowella Grip III M.D.   On: 05/08/2015 18:33    Scheduled Meds: . aspirin EC  81 mg Oral Daily  . atorvastatin  40 mg Oral Daily  . capsaicin   Topical BID  . carvedilol  12.5 mg Oral BID WC  . clopidogrel  75 mg Oral Q breakfast  . hydrALAZINE  37.5 mg Oral BID  . insulin glargine  60 Units Subcutaneous Q2200  . isosorbide mononitrate  30 mg Oral Daily  . methylPREDNISolone (SOLU-MEDROL) injection  125 mg Intravenous Q12H  . pantoprazole  40 mg Oral Daily  . sertraline  150 mg Oral Daily  . tamsulosin  0.4 mg Oral Daily  . terazosin  1 mg Oral QHS   Continuous Infusions: . sodium  chloride 75 mL/hr at 05/10/15 0815   Antibiotics Given (last 72 hours)    None      Principal Problem:   Left hand pain Active Problems:   Atrioventricular block, complete (HCC)   Obesity   Pacemaker   Diabetes type 2, uncontrolled (HCC)   HTN (hypertension)   CAD (coronary artery disease), native coronary artery, in all vessels, significant   CKD (chronic kidney disease) stage 4, GFR 15-29 ml/min (HCC)   Chronic systolic CHF (congestive heart failure) (HCC)   Left arm pain    Time spent: 25 min    Bryannah Boston  [facility] Pager [phone]. If 7PM-7AM, please contact night-coverage at [email or web address], password [facility] 05/10/2015, 1:11 PM  LOS: 2 days

## 2015-05-11 DIAGNOSIS — M79642 Pain in left hand: Secondary | ICD-10-CM

## 2015-05-11 LAB — CBC
HEMATOCRIT: 30.3 % — AB (ref 39.0–52.0)
Hemoglobin: 10.3 g/dL — ABNORMAL LOW (ref 13.0–17.0)
MCH: 30.8 pg (ref 26.0–34.0)
MCHC: 34 g/dL (ref 30.0–36.0)
MCV: 90.7 fL (ref 78.0–100.0)
Platelets: 195 10*3/uL (ref 150–400)
RBC: 3.34 MIL/uL — AB (ref 4.22–5.81)
RDW: 14 % (ref 11.5–15.5)
WBC: 17.5 10*3/uL — AB (ref 4.0–10.5)

## 2015-05-11 LAB — GLUCOSE, CAPILLARY
GLUCOSE-CAPILLARY: 176 mg/dL — AB (ref 65–99)
Glucose-Capillary: 199 mg/dL — ABNORMAL HIGH (ref 65–99)
Glucose-Capillary: 218 mg/dL — ABNORMAL HIGH (ref 65–99)

## 2015-05-11 LAB — BASIC METABOLIC PANEL
ANION GAP: 15 (ref 5–15)
BUN: 89 mg/dL — ABNORMAL HIGH (ref 6–20)
CALCIUM: 8.7 mg/dL — AB (ref 8.9–10.3)
CO2: 19 mmol/L — AB (ref 22–32)
Chloride: 98 mmol/L — ABNORMAL LOW (ref 101–111)
Creatinine, Ser: 3.72 mg/dL — ABNORMAL HIGH (ref 0.61–1.24)
GFR, EST AFRICAN AMERICAN: 16 mL/min — AB (ref >60)
GFR, EST NON AFRICAN AMERICAN: 14 mL/min — AB (ref >60)
GLUCOSE: 215 mg/dL — AB (ref 65–99)
POTASSIUM: 4.4 mmol/L (ref 3.5–5.1)
Sodium: 132 mmol/L — ABNORMAL LOW (ref 135–145)

## 2015-05-11 LAB — B. BURGDORFI ANTIBODIES

## 2015-05-11 MED ORDER — COLCHICINE 0.6 MG PO TABS
0.6000 mg | ORAL_TABLET | Freq: Once | ORAL | Status: AC
  Administered 2015-05-11: 0.6 mg via ORAL
  Filled 2015-05-11: qty 1

## 2015-05-11 MED ORDER — PREDNISONE 5 MG PO TABS: 5.0000 mg | ORAL_TABLET | Freq: Every day | ORAL | Status: DC

## 2015-05-11 MED ORDER — COLCHICINE 0.6 MG PO TABS: 0.6000 mg | ORAL_TABLET | Freq: Every day | ORAL | Status: DC

## 2015-05-11 MED ORDER — METHYLPREDNISOLONE SODIUM SUCC 125 MG IJ SOLR
60.0000 mg | Freq: Two times a day (BID) | INTRAMUSCULAR | Status: DC
  Administered 2015-05-11: 60 mg via INTRAVENOUS

## 2015-05-11 MED ORDER — COLCHICINE 0.6 MG PO TABS: 0.3000 mg | ORAL_TABLET | Freq: Every day | ORAL | Status: DC

## 2015-05-11 NOTE — Progress Notes (Signed)
Patient opted for Home Health after meeting with Case Manager.  Home Health set up and rolling walker/3-n-1 delivered to patient room.  Patient and wife received discharge instructions and prescriptions.  Denied any questions.  Assisted to vehicle via wheelchair by RN and NT.  Wife to transport patient home via personal vehicle.

## 2015-05-11 NOTE — Clinical Social Work Note (Addendum)
CSW received consult for SNF placement.  CSW met with patient and his wife to discuss SNF placement option, patient and his wife refused to consider going to a SNF.  Patient's wife is concerned that her insurance would not pay for it, because he is not a [place] Citizen.  CSW explained to patient and his wife that insurance should be able to pay for it, but patient still would not agree to go.  Patient and his wife would like to discuss home health options, CSW contacted weekend case manager, who will follow up with patient to discuss home health options. CSW to sign off please reconsult if other social work needs arise.  Rodney Farley. [place], MSW, [place] 05/11/2015 4:38 PM

## 2015-05-11 NOTE — Discharge Summary (Signed)
Physician Discharge Summary  Rodney Farley [id number] DOB: 05-18-1932 DOA: 05/07/2015  PCP: [name], MD  Admit date: 05/07/2015 Discharge date: 05/11/2015  Time spent: > 35 minutes  Recommendations for Outpatient Follow-up:  1. Monitor S creatinine 2. Monitor blood sugars 3. Recently diagnosed with gout  Discharge Diagnoses:  Principal Problem:   Left hand pain Active Problems:   Atrioventricular block, complete (HCC)   Obesity   Pacemaker   Diabetes type 2, uncontrolled ([place])   HTN (hypertension)   CAD (coronary artery disease), native coronary artery, in all vessels, significant   CKD (chronic kidney disease) stage 4, GFR 15-29 ml/min (HCC)   Chronic systolic CHF (congestive heart failure) (HCC)   Left arm pain   AKI (acute kidney injury) ([place])   Discharge Condition: stable  Diet recommendation: heart healthy/carb modified diet  Filed Weights   05/07/15 2143  Weight: 100.245 kg (221 lb)    History of present illness:  81 y/o that presented with gout flair.  Hospital Course:  Gout flair - initially concern was whether or not patient had neurological problem as such neurology consulted. On day of discharge neurology recommended the following: Recommendations: 1. D/C Capsacin 2. Decrease Solumedrol to 60mg  BID. Internist to take over appropriate care for gout, Dr. Wendee Beavers contacted.  3. Cancel MRI and NCV  Will discharge a short course of prednisone for the next 4 days to complete a five-day treatment course. We'll place on colchicine 0.3 mg by mouth daily.  Procedures:  None  Consultations:  Neurology  Discharge Exam: Filed Vitals:   05/11/15 1420  BP: 133/61  Pulse: 60  Temp: 98.4 F (36.9 C)  Resp: 18    General: Patient in no acute distress, alert and awake Cardiovascular: Regular rate and rhythm, no rubs Respiratory: No increased work of breathing, no wheezes  Discharge Instructions   Discharge Instructions    AMB  Referral to [place] [facility]    Complete by:  As directed   Please assign UMR member to [facility] RNCM for home visits. Currently in hospital. Consents signed. Was active with [facility] prior. Please call with questions. Marthenia Rolling, [place], [facility] [id number]  Reason for consult:  Please assign UMR patient to [place] for home visits.  Expected date of contact:  1-3 days (reserved for hospital discharges)     Call MD for:  redness, tenderness, or signs of infection (pain, swelling, redness, odor or green/yellow discharge around incision site)    Complete by:  As directed      Call MD for:  severe uncontrolled pain    Complete by:  As directed      Call MD for:  temperature >100.4    Complete by:  As directed      Diet - low sodium heart healthy    Complete by:  As directed      Discharge instructions    Complete by:  As directed   Please follow up with your primary care physician in 1 week for continued monitoring and recommendations of your gout.     Increase activity slowly    Complete by:  As directed           Current Discharge Medication List    START taking these medications   Details  colchicine 0.6 MG tablet Take 0.5 tablets (0.3 mg total) by mouth daily. Qty: 30 tablet, Refills: 0    predniSONE (DELTASONE) 5 MG tablet Take 1 tablet (5  mg total) by mouth daily with breakfast. Qty: 4 tablet, Refills: 0      CONTINUE these medications which have NOT CHANGED   Details  aspirin EC 81 MG tablet Take 81 mg by mouth daily.    atorvastatin (LIPITOR) 40 MG tablet Take 1 tablet (40 mg total) by mouth daily. Qty: 90 tablet, Refills: 3    carvedilol (COREG) 12.5 MG tablet Take 1 tablet (12.5 mg total) by mouth 2 (two) times daily with a meal. Qty: 60 tablet, Refills: 6    clopidogrel (PLAVIX) 75 MG tablet Take 1 tablet (75 mg total) by mouth daily with breakfast. Qty: 30 tablet, Refills: 6    furosemide (LASIX) 40 MG tablet  Take 1 tablet (40 mg total) by mouth daily. Qty: 90 tablet, Refills: 3    hydrALAZINE (APRESOLINE) 25 MG tablet Take 1.5 tablets (37.5 mg total) by mouth 2 (two) times daily. Qty: 90 tablet, Refills: 11    Insulin Glargine (LANTUS SOLOSTAR) 100 UNIT/ML Solostar Pen Inject 60 Units into the skin daily at 10 pm. Qty: 5 pen, Refills: PRN    isosorbide mononitrate (IMDUR) 30 MG 24 hr tablet Take 1 tablet (30 mg total) by mouth daily. Qty: 30 tablet, Refills: 6    OVER THE COUNTER MEDICATION Place 1 drop into both eyes 3 (three) times daily. OTC [place]ch[place] eye bath    pantoprazole (PROTONIX) 40 MG tablet Take 1 tablet (40 mg total) by mouth daily. Qty: 30 tablet, Refills: 6    sertraline (ZOLOFT) 100 MG tablet Take 1.5 tablets (150 mg total) by mouth daily. Qty: 135 tablet, Refills: 1   Associated Diagnoses: Depression    tamsulosin (FLOMAX) 0.4 MG CAPS capsule TAKE 1 CAPSULE BY MOUTH DAILY. Qty: 30 capsule, Refills: PRN    terazosin (HYTRIN) 1 MG capsule TAKE 1 CAPSULE BY MOUTH DAILY AT BEDTIME Qty: 90 capsule, Refills: 1    vitamin B-12 (CYANOCOBALAMIN) 1000 MCG tablet Take 1,000 mcg by mouth daily.    Insulin Pen Needle (B-D UF III MINI PEN NEEDLES) 31G X 5 MM MISC Use to test blood sugar 3 times daily. Dx code: 38.02 Qty: 100 each, Refills: 11    nitroGLYCERIN (NITROSTAT) 0.4 MG SL tablet Place 1 tablet (0.4 mg total) under the tongue every 5 (five) minutes x 3 doses as needed for chest pain. Qty: 25 tablet, Refills: 3       Allergies  Allergen Reactions  . Bee Venom Anaphylaxis  . Zocor [Simvastatin] Nausea Only and Other (See Comments)    Headache with brand name only.  Can take the generic.   Follow-up Information    Follow up with Phillips Odor, MD On 05/14/2015.   Specialty:  Neurology   Why:  Appt is at 2:45 PM but arrive 30  minutes early and bring insurance cards.    Contact information:   [address] [place] [address] [phone]         The results of significant diagnostics from this hospitalization (including imaging, microbiology, ancillary and laboratory) are listed below for reference.    Significant Diagnostic Studies: Dg Chest 2 View  05/08/2015  CLINICAL DATA:  81 year old male with left arm swelling and pain. EXAM: CHEST  2 VIEW COMPARISON:  Chest x-ray a 04/04/2015. FINDINGS: Lung volumes are normal. No consolidative airspace disease. No pleural effusions. Cephalization of the pulmonary vasculature, without frank pulmonary edema. Heart size is mildly enlarged. Upper mediastinal contours are within normal limits. Left-sided pacemaker device in position with lead  tips projecting over the expected location of the right atrium and right ventricular apex. IMPRESSION: 1. Cardiomegaly with pulmonary venous congestion, but no frank pulmonary edema. Electronically Signed   By: Vinnie Langton M.D.   On: 05/08/2015 02:05   Ct Head Wo Contrast  05/08/2015  CLINICAL DATA:  Acute onset of left arm weakness and swelling. Initial encounter. EXAM: CT HEAD WITHOUT CONTRAST TECHNIQUE: Contiguous axial images were obtained from the base of the skull through the vertex without intravenous contrast. COMPARISON:  CT of the head performed 04/07/2010 FINDINGS: There is no evidence of acute infarction, mass lesion, or intra- or extra-axial hemorrhage on CT. Prominence of the ventricles and sulci reflects mild cortical volume loss. Scattered periventricular and subcortical white matter change likely reflects small vessel ischemic microangiopathy. The brainstem and fourth ventricle are within normal limits. The basal ganglia are unremarkable in appearance. The cerebral hemispheres demonstrate grossly normal gray-white differentiation. No mass effect or midline shift is seen. There is no evidence of fracture; visualized osseous structures are unremarkable in appearance. The orbits are within normal limits. The paranasal sinuses and mastoid air  cells are well-aerated. No significant soft tissue abnormalities are seen. IMPRESSION: 1. No acute intracranial pathology seen on CT. 2. Mild cortical volume loss and scattered small vessel ischemic microangiopathy. Electronically Signed   By: Garald Balding M.D.   On: 05/08/2015 04:33   Ct Cervical Spine Wo Contrast  05/08/2015  CLINICAL DATA:  Bilateral upper extremity pain and radicular type symptoms. No known trauma EXAM: CT CERVICAL SPINE WITHOUT CONTRAST TECHNIQUE: Multidetector CT imaging of the cervical spine was performed without intravenous contrast. Multiplanar CT image reconstructions were also generated. COMPARISON:  None. FINDINGS: There is no fracture or spondylolisthesis. Prevertebral soft tissues and predental space regions are normal. There is calcification in the anterior ligament at C2-3, C5-6, C6-7, and C7-T1. There is mild disc space narrowing C4-5, C6-7, and C7-T1. At C2-3, there is slight facet hypertrophy bilaterally. No nerve root edema or effacement. No disc extrusion or stenosis. At C3-4, there is mild facet hypertrophy bilaterally. No nerve root edema or effacement. No disc extrusion or stenosis. There is slight central disc bulging. At C4-5, there is mild facet osteoarthritic change bilaterally. There is slight exit foraminal narrowing due to bony hypertrophy on the right at this level. No nerve root edema or effacement. No disc extrusion or stenosis. At C5-6, there is moderate facet hypertrophy bilaterally. No nerve root edema or effacement. No disc extrusion or stenosis. At C6-7, there is mild facet hypertrophy bilaterally. There is mild central disc bulging. No nerve root edema or effacement. No disc extrusion or stenosis. At C7-T1, there is mild facet hypertrophy bilaterally. No nerve root edema or effacement. No disc extrusion or stenosis. There are foci of calcification in both carotid arteries. IMPRESSION: Multilevel osteoarthritic change. No nerve root edema or effacement.  No disc extrusion or stenosis. No fracture or spondylolisthesis. Foci of calcification in each carotid artery noted. Electronically Signed   By: Lowella Grip III M.D.   On: 05/08/2015 18:33    Microbiology: No results found for this or any previous visit (from the past 240 hour(s)).   Labs: Basic Metabolic Panel:  Recent Labs Lab 05/07/15 2225 05/10/15 0504 05/11/15 0609  NA 137 135 132*  K 4.5 4.6 4.4  CL 103 102 98*  CO2 24 20* 19*  GLUCOSE 100* 81 215*  BUN 34* 60* 89*  CREATININE 2.29* 3.63* 3.72*  CALCIUM 9.5 9.3 8.7*   Liver  Function Tests:  Recent Labs Lab 05/07/15 2225  AST 25  ALT 29  ALKPHOS 102  BILITOT 0.7  PROT 6.8  ALBUMIN 3.7   No results for input(s): LIPASE, AMYLASE in the last 168 hours. No results for input(s): AMMONIA in the last 168 hours. CBC:  Recent Labs Lab 05/07/15 2225 05/10/15 0504 05/11/15 0609  WBC 12.2* 15.0* 17.5*  NEUTROABS 8.8*  --   --   HGB 13.0 10.7* 10.3*  HCT 38.7* 32.4* 30.3*  MCV 91.7 91.3 90.7  PLT 195 187 195   Cardiac Enzymes:  Recent Labs Lab 05/08/15 0546  CKTOTAL 95   BNP: BNP (last 3 results) No results for input(s): BNP in the last 8760 hours.  ProBNP (last 3 results) No results for input(s): PROBNP in the last 8760 hours.  CBG:  Recent Labs Lab 05/10/15 1118 05/10/15 1628 05/10/15 2145 05/11/15 0709 05/11/15 1136  GLUCAP 133* 187* 253* 199* 218*    Signed:  Velvet Bathe  [facility] 05/11/2015, 2:22 PM

## 2015-05-11 NOTE — Progress Notes (Signed)
Patient's wife had concerns about the patient's mobility while home. Patient with no concerns.  Dr. Wendee Beavers made aware, patient's wife then stated she would be agreeable to patient going home.  Patient is unsteady while ambulating, required 1-2 assist. Social worker spoke with patient to ensure they had all needs met.  Patient and patient's wife would like some assistance while home, but unsure if insurance will cover because he is not a [place] citizen.  Social work to follow up with case management about their options.

## 2015-05-11 NOTE — Progress Notes (Signed)
Subjective: Patient reports that he is even better this morning.  Eating breakfast unassisted.  Does report that he had some hallucinations, particularly last night but during the afternoon on yesterday as well    Objective: Current vital signs: BP 145/64 mmHg  Pulse 63  Temp(Src) 97.7 F (36.5 C) (Oral)  Resp 18  Ht 5\' 7"  (1.702 m)  Wt 100.245 kg (221 lb)  BMI 34.61 kg/m2  SpO2 95% Vital signs in last 24 hours: Temp:  [97.7 F (36.5 C)-99.8 F (37.7 C)] 97.7 F (36.5 C) (10/29 [id number]) Pulse Rate:  [63-71] 63 (10/29 0712) Resp:  [18] 18 (10/29 0712) BP: (130-145)/(51-79) 145/64 mmHg (10/29 0712) SpO2:  [93 %-95 %] 95 % (10/29 0712)  Intake/Output from previous day: 10/28 0701 - 10/29 0700 In: 240 [P.O.:240] Out: 1100 [Urine:1100] Intake/Output this shift:   Nutritional status: Diet heart healthy/carb modified Room service appropriate?: Yes; Fluid consistency:: Thin  Neurologic Exam: Mental Status: Alert, oriented, thought content appropriate. Speech fluent without evidence of aphasia. Able to follow 3 step commands without difficulty. Cranial Nerves: II: Discs flat bilaterally; Visual fields grossly normal, pupils equal, round, reactive to light and accommodation III,IV, VI: ptosis not present, extra-ocular motions intact bilaterally V,VII: smile symmetric, facial light touch sensation normal bilaterally VIII: hearing normal bilaterally IX,X: uvula rises symmetrically XI: bilateral shoulder shrug XII: midline tongue extension without atrophy or fasciculations Motor: Hand grip improved. Lifts both arms easily above his head.  No restriction in movement noted.  Full range noted in RLE as well.  Tremor noted of the upper extremities.    Lab Results: Basic Metabolic Panel:  Recent Labs Lab 05/07/15 2225 05/10/15 0504 05/11/15 0609  NA 137 135 132*  K 4.5 4.6 4.4  CL 103 102 98*  CO2 24 20* 19*  GLUCOSE 100* 81 215*  BUN 34* 60* 89*  CREATININE 2.29* 3.63* 3.72*   CALCIUM 9.5 9.3 8.7*    Liver Function Tests:  Recent Labs Lab 05/07/15 2225  AST 25  ALT 29  ALKPHOS 102  BILITOT 0.7  PROT 6.8  ALBUMIN 3.7   No results for input(s): LIPASE, AMYLASE in the last 168 hours. No results for input(s): AMMONIA in the last 168 hours.  CBC:  Recent Labs Lab 05/07/15 2225 05/10/15 0504 05/11/15 0609  WBC 12.2* 15.0* 17.5*  NEUTROABS 8.8*  --   --   HGB 13.0 10.7* 10.3*  HCT 38.7* 32.4* 30.3*  MCV 91.7 91.3 90.7  PLT 195 187 195    Cardiac Enzymes:  Recent Labs Lab 05/08/15 0546  CKTOTAL 95    Lipid Panel: No results for input(s): CHOL, TRIG, HDL, CHOLHDL, VLDL, LDLCALC in the last 168 hours.  CBG:  Recent Labs Lab 05/10/15 0609 05/10/15 1118 05/10/15 1628 05/10/15 2145 05/11/15 0709  GLUCAP 73 133* 187* 253* 199*    Microbiology: No results found for this or any previous visit.  Coagulation Studies: No results for input(s): LABPROT, INR in the last 72 hours.  Imaging: No results found.  Medications:  I have reviewed the patient's current medications. Scheduled: . aspirin EC  81 mg Oral Daily  . atorvastatin  40 mg Oral Daily  . capsaicin   Topical BID  . carvedilol  12.5 mg Oral BID WC  . clopidogrel  75 mg Oral Q breakfast  . hydrALAZINE  37.5 mg Oral BID  . insulin aspart  0-15 Units Subcutaneous TID WC  . insulin aspart  0-5 Units Subcutaneous QHS  . insulin glargine  60 Units Subcutaneous Q2200  . isosorbide mononitrate  30 mg Oral Daily  . methylPREDNISolone (SOLU-MEDROL) injection  125 mg Intravenous Q12H  . pantoprazole  40 mg Oral Daily  . sertraline  150 mg Oral Daily  . tamsulosin  0.4 mg Oral Daily  . terazosin  1 mg Oral QHS    Assessment/Plan: Patient much improved.  Suspect gout the etiology of patient's symptoms.  Hallucinations may be partly sundowning and partly related to high dose steroids.  Will start taper today.   MRI of cervical spine and NCV/EMG no longer indicated.     Recommendations: 1.  D/C Capsacin 2.  Decrease Solumedrol to 60mg  BID.  Internist to take over appropriate care for gout, Dr. Wendee Beavers contacted.   3.  Cancel MRI and NCV   LOS: 3 days   Alexis Goodell, MD [facility] [phone] 05/11/2015  9:18 AM

## 2015-05-12 NOTE — Care Management Note (Signed)
Case Management Note  Patient Details  Name: Rodney Farley MRN: [id number] Date of Birth: [birth year]  Subjective/Objective:                   Left hand pain Action/Plan:  Discharge planning Expected Discharge Date:  05/11/15               Expected Discharge Plan:  [place]  In-House Referral:     Discharge planning Services  CM Consult  Post Acute Care Choice:    Choice offered to:  Patient  DME Arranged:  3-N-1, Walker rolling DME Agency:     HH Arranged:  PT, OT HH Agency:  [place] [facility]  Status of Service:  Completed, signed off  Medicare Important Message Given:    Date Medicare IM Given:    Medicare IM give by:    Date Additional Medicare IM Given:    Additional Medicare Important Message give by:     If discussed at [place] of Stay Meetings, dates discussed:    Additional Comments: Cm met with pt and wife in room to offer choice of home health agency.  Pt chooses [facility] to render HHPT/OT.  Pt has rolling walker and 3n1 in room to take home.  Cm called [facility] REP, Tiffany with referral.  No other CM needs were communicated. Dellie Catholic, RN 05/12/2015, 10:59 AM

## 2015-05-13 ENCOUNTER — Encounter (HOSPITAL_COMMUNITY): Payer: Self-pay | Admitting: Emergency Medicine

## 2015-05-13 ENCOUNTER — Emergency Department (HOSPITAL_COMMUNITY)
Admission: EM | Admit: 2015-05-13 | Discharge: 2015-05-14 | Disposition: A | Discharge status: Home / Self Care | Payer: 59 | Attending: Emergency Medicine | Admitting: Emergency Medicine

## 2015-05-13 ENCOUNTER — Other Ambulatory Visit: Payer: Self-pay | Admitting: *Deleted

## 2015-05-13 ENCOUNTER — Encounter: Payer: Self-pay | Admitting: *Deleted

## 2015-05-13 DIAGNOSIS — I252 Old myocardial infarction: Secondary | ICD-10-CM | POA: Diagnosis not present

## 2015-05-13 DIAGNOSIS — Z7902 Long term (current) use of antithrombotics/antiplatelets: Secondary | ICD-10-CM | POA: Insufficient documentation

## 2015-05-13 DIAGNOSIS — Z950 Presence of cardiac pacemaker: Secondary | ICD-10-CM | POA: Diagnosis not present

## 2015-05-13 DIAGNOSIS — I442 Atrioventricular block, complete: Secondary | ICD-10-CM | POA: Diagnosis not present

## 2015-05-13 DIAGNOSIS — K219 Gastro-esophageal reflux disease without esophagitis: Secondary | ICD-10-CM | POA: Insufficient documentation

## 2015-05-13 DIAGNOSIS — Z794 Long term (current) use of insulin: Secondary | ICD-10-CM | POA: Insufficient documentation

## 2015-05-13 DIAGNOSIS — Z7952 Long term (current) use of systemic steroids: Secondary | ICD-10-CM | POA: Diagnosis not present

## 2015-05-13 DIAGNOSIS — E7800 Pure hypercholesterolemia, unspecified: Secondary | ICD-10-CM | POA: Diagnosis not present

## 2015-05-13 DIAGNOSIS — I129 Hypertensive chronic kidney disease with stage 1 through stage 4 chronic kidney disease, or unspecified chronic kidney disease: Secondary | ICD-10-CM | POA: Diagnosis not present

## 2015-05-13 DIAGNOSIS — E669 Obesity, unspecified: Secondary | ICD-10-CM | POA: Diagnosis not present

## 2015-05-13 DIAGNOSIS — Z7982 Long term (current) use of aspirin: Secondary | ICD-10-CM | POA: Insufficient documentation

## 2015-05-13 DIAGNOSIS — F419 Anxiety disorder, unspecified: Secondary | ICD-10-CM | POA: Insufficient documentation

## 2015-05-13 DIAGNOSIS — Z87891 Personal history of nicotine dependence: Secondary | ICD-10-CM | POA: Diagnosis not present

## 2015-05-13 DIAGNOSIS — M79604 Pain in right leg: Secondary | ICD-10-CM | POA: Diagnosis present

## 2015-05-13 DIAGNOSIS — I25119 Atherosclerotic heart disease of native coronary artery with unspecified angina pectoris: Secondary | ICD-10-CM | POA: Diagnosis not present

## 2015-05-13 DIAGNOSIS — F329 Major depressive disorder, single episode, unspecified: Secondary | ICD-10-CM | POA: Insufficient documentation

## 2015-05-13 DIAGNOSIS — R52 Pain, unspecified: Secondary | ICD-10-CM | POA: Insufficient documentation

## 2015-05-13 DIAGNOSIS — N184 Chronic kidney disease, stage 4 (severe): Secondary | ICD-10-CM | POA: Insufficient documentation

## 2015-05-13 DIAGNOSIS — J449 Chronic obstructive pulmonary disease, unspecified: Secondary | ICD-10-CM | POA: Diagnosis not present

## 2015-05-13 DIAGNOSIS — E1140 Type 2 diabetes mellitus with diabetic neuropathy, unspecified: Secondary | ICD-10-CM | POA: Insufficient documentation

## 2015-05-13 DIAGNOSIS — I5022 Chronic systolic (congestive) heart failure: Secondary | ICD-10-CM | POA: Insufficient documentation

## 2015-05-13 DIAGNOSIS — I2510 Atherosclerotic heart disease of native coronary artery without angina pectoris: Secondary | ICD-10-CM | POA: Diagnosis not present

## 2015-05-13 MED ORDER — ONDANSETRON HCL 4 MG/2ML IJ SOLN
4.0000 mg | Freq: Once | INTRAMUSCULAR | Status: AC
  Administered 2015-05-14: 4 mg via INTRAVENOUS
  Filled 2015-05-13: qty 2

## 2015-05-13 MED ORDER — FENTANYL CITRATE (PF) 100 MCG/2ML IJ SOLN
50.0000 ug | Freq: Once | INTRAMUSCULAR | Status: AC
  Administered 2015-05-14: 50 ug via INTRAVENOUS
  Filled 2015-05-13: qty 2

## 2015-05-13 NOTE — Patient Outreach (Addendum)
[facility]) Care Management  05/13/2015  Rodney Farley [birth year] [id number]  Initial Transition of care contact  RN spoke with pt's spouse today as RN informed pt was sleeping. RN introduced the [facility] services and available programs to assist pt with his ongoing recovery and States pt is doing well with no acute issues at this time and has [facility] involved with DME services.  Primary caregiver was able to provide information concerning the pt's recent discharge.  RN also inquired if pt has an appointment with his primary doctor scheduled since his recent discharge from the hospital. States she does not have a follow up appointment with the primary provider (Dr. [name]). RN stress the importance of following up with the primary provider and offered to arrange an appointment. Spouse indicated she would contact Dr. Sonia Baller and arrange the post hospital visit this week. RN informed the caregiver to inform the provider's office that pt recently discharged from the hospital for an sooner appointment concerning any changes needed with the pt's medications or ongoing treatment plan (spouse verbalized an understanding). Reports pt will follow up with the neurologist 11/1 and pt has been informed he needs a cardiovascular consult. RN inquired on pt's HF and knowledge concerning the HF zones. Caregiver not familiar with the HF zones but states pt has been weighing daily and reports today's weight at 228 lbs and yesterday at 230 lbs. RN offered community home visit to education pt and caregiver further on prevention measures concerning pt's ongoing medical issues and how to improve pt's management of care with closer monitoring and education on the his HF however caregiver not receptive to home visits at this time but receptive to ongoing telephonic calls of inquire during this transition. RN stress the importance of closely monitoring his HF with daily weights every morning after  emptying his bladder and review the HF zones accordingly. Denies any swelling to pt's arms, hands or legs but states pt has a history of swelling to his feet (reports "a little" in the amount) and his providers are aware. RN request to follow up with an appointment for transition of care outreach call (caregiver receptive) as RN will schedule this appointment. RN will continue to extend community home visits and educate accordingly based upon pt's and caregiver lack of knowledge based upon pt's HF.  Briefly discussed pt's diabetes with no issus mentioned at this time.   Raina Mina, RN Care Management Coordinator [place] Main Office [phone]    Raina Mina, RN Care Management Coordinator [place] Main Office [phone]

## 2015-05-13 NOTE — ED Notes (Signed)
Pt reports pain to bilat leg "tissues" medially and "feels like a band in my belly" pointing to his lower abdomen.  Also pain to right neck/jaw area.  Generalized pain all over.

## 2015-05-13 NOTE — Progress Notes (Signed)
This encounter was created in error - please disregard.

## 2015-05-13 NOTE — ED Provider Notes (Signed)
TIME SEEN: 11:37 PM  CHIEF COMPLAINT: Leg pain  HPI:  HPI Comments: Rodney Farley is a 81 y.o. male, with a h/o complete AV block s/p pacemaker, and a PMhx of COPD, CAD s/p stents (last Sept 2016 - Mid LAD lesion, 90% stenosed, 2nd Diag lesion, 90% stenosed, 3rd Mrg lesion, 90% stenosed. He received drug-eluting stents to each lesion.) TIA, DM, HLD, CKD stage IV, and chronic systolic HF who presents to the Emergency Department complaining of waxing and waning, severe, shooting, diffuse pain onset spontaneously today that is worse with movement. This pain is predominantly in his left groin and anterior, left upper leg. But also reports that he was currently have pain in his right leg, both arms and in his mouth. Also having intermittent pain in his lower back. No neck pain. Better with staying still. Patient recently admitted to the hospital at the end of October for similar symptoms. Had swelling in his upper extremities at that time. Had a negative venous Doppler of his left arm. Pt was recently diagnosed with gout.  He was discharged 2 days ago with diagnosis of gout and prescribed a prednisone course for the following 4 days and placed on colchicine 0.3 mg by mouth daily.   He also c/o CP onset this morning that he describes as a pressure and was unrelieved by a NTG tablet he took 13 hours ago. He associates mild SOB. H/o catheterization inserted in right groin over 1 month ago.   Per wife, the pt fell 2 days ago on his left elbow onto concrete while walking out of their house in the dark. Pt denies any pain from this fall or LOC. States he was hallucinating after being on steroids and states he thought he was fighting off Bears which led to his fall. Wife denies that they return to the hospital for evaluation for his injuries.  Also denies nausea, vomiting, worsening diarrhea but notes diarrhea at baseline, or fever.    Wife reports he has been unable to ambulate because of pain. She states he  has been in the bed for the past several days.  PCP: [name], MD; Cardiologist: Floy [name], MD  ROS: See HPI Constitutional: no fever  Eyes: no drainage  ENT: no runny nose   Cardiovascular:  chest pain  Resp: SOB  GI: no vomiting GU: no dysuria Integumentary: no rash  Allergy: no hives  Musculoskeletal: no leg swelling  Neurological: no slurred speech ROS otherwise negative  PAST MEDICAL HISTORY/PAST SURGICAL HISTORY:  Past Medical History  Diagnosis Date  . COPD (chronic obstructive pulmonary disease) (HCC)     Severe  . Hypercholesterolemia   . Depression   . Coronary artery disease     a. s/p MI in 1994/1995 while in [place] s/p questionable PCI. 03/2015: progression of disease, for staged PCI.  [name] Hypertension   . Atrioventricular block, complete ([place])     a. 2010 s/p pacemaker.  . Pacemaker     medtronic  . Obesity   . TIA (transient ischemic attack) X 3  . Neuropathy (HCC)     IN LOWER EXTREMITIES  . GERD (gastroesophageal reflux disease)   . DOE (dyspnea on exertion) 03/28/2015  . CKD (chronic kidney disease), stage IV ([place])   . Type II diabetes mellitus ([place])   . Diabetic peripheral neuropathy ([place])   . Chronic systolic CHF (congestive heart failure) ([place])     a. LVEF 35-40% by echo 09/2014.  . MI (myocardial infarction) ([place]) 1994;  1995  . Anginal pain ([place])   . Sleep apnea     "sleeps w/humidifyer when he panics and gets short of breath" (04/08/2015)  . Anxiety   . Arm pain 05/08/2015    LEFT ARM    MEDICATIONS:  Prior to Admission medications   Medication Sig Start Date End Date Taking? Authorizing Provider  aspirin EC 81 MG tablet Take 81 mg by mouth daily.    Historical Provider, MD  atorvastatin (LIPITOR) 40 MG tablet Take 1 tablet (40 mg total) by mouth daily. 04/19/15   [name], MD  carvedilol (COREG) 12.5 MG tablet Take 1 tablet (12.5 mg total) by mouth 2 (two) times daily with a meal. 10/17/14   Rogelia Mire, NP   clopidogrel (PLAVIX) 75 MG tablet Take 1 tablet (75 mg total) by mouth daily with breakfast. 03/29/15   Isaiah Serge, NP  colchicine 0.6 MG tablet Take 0.5 tablets (0.3 mg total) by mouth daily. 05/12/15   Velvet Bathe, MD  furosemide (LASIX) 40 MG tablet Take 1 tablet (40 mg total) by mouth daily. 04/17/15   Burtis Junes, NP  hydrALAZINE (APRESOLINE) 25 MG tablet Take 1.5 tablets (37.5 mg total) by mouth 2 (two) times daily. Patient taking differently: Take 37.5 mg by mouth 3 (three) times daily.  04/17/15   Burtis Junes, NP  Insulin Glargine (LANTUS SOLOSTAR) 100 UNIT/ML Solostar Pen Inject 60 Units into the skin daily at 10 pm. 01/09/14   [name], MD  Insulin Pen Needle (B-D UF III MINI PEN NEEDLES) 31G X 5 MM MISC Use to test blood sugar 3 times daily. Dx code: 250.02 Patient not taking: Reported on 05/08/2015 10/03/12   [name], MD  isosorbide mononitrate (IMDUR) 30 MG 24 hr tablet Take 1 tablet (30 mg total) by mouth daily. 03/29/15   Isaiah Serge, NP  nitroGLYCERIN (NITROSTAT) 0.4 MG SL tablet Place 1 tablet (0.4 mg total) under the tongue every 5 (five) minutes x 3 doses as needed for chest pain. 10/17/14   Rogelia Mire, NP  OVER THE COUNTER MEDICATION Place 1 drop into both eyes 3 (three) times daily. OTC [place] eye bath    Historical Provider, MD  pantoprazole (PROTONIX) 40 MG tablet Take 1 tablet (40 mg total) by mouth daily. 03/29/15   Isaiah Serge, NP  predniSONE (DELTASONE) 5 MG tablet Take 1 tablet (5 mg total) by mouth daily with breakfast. 05/12/15   Velvet Bathe, MD  sertraline (ZOLOFT) 100 MG tablet Take 1.5 tablets (150 mg total) by mouth daily. 04/11/15   [name], MD  tamsulosin (FLOMAX) 0.4 MG CAPS capsule TAKE 1 CAPSULE BY MOUTH DAILY. 01/30/15   Tishira R Brewington, PA-C  terazosin (HYTRIN) 1 MG capsule TAKE 1 CAPSULE BY MOUTH DAILY AT BEDTIME 10/16/14   [name], MD  vitamin B-12 (CYANOCOBALAMIN) 1000 MCG tablet Take  1,000 mcg by mouth daily.    Historical Provider, MD    ALLERGIES:  Allergies  Allergen Reactions  . Bee Venom Anaphylaxis  . Zocor [Simvastatin] Nausea Only and Other (See Comments)    Headache with brand name only.  Can take the generic.    SOCIAL HISTORY:  Social History  Substance Use Topics  . Smoking status: Former Smoker -- 1.50 packs/day for 54 years    Types: Cigarettes    Quit date: 07/18/2007  . Smokeless tobacco: Never Used  . Alcohol Use: Yes     Comment: 04/08/2015 "  probably 3 drinks/month"    FAMILY HISTORY: Family History  Problem Relation Age of Onset  . Stroke Mother   . Leukemia Father   . Stroke Sister   . Heart attack Brother     EXAM: BP 164/67 mmHg  Pulse 65  Temp(Src) 98.3 F (36.8 C) (Oral)  Resp 20  Ht 5\' 7"  (1.702 m)  Wt 227 lb (102.967 kg)  BMI 35.55 kg/m2  SpO2 95% CONSTITUTIONAL: Alert and oriented and responds appropriately to questions. Elderly. Appears uncomfortable, mostly with movement, but unable to reproduce pain with palpation to any part of his body but patient screams in pain when you try to set him up right HEAD: Normocephalic EYES: Conjunctivae clear, PERRL ENT: normal nose; no rhinorrhea; significantly dry mucous membranes; pharynx without lesions noted NECK: Supple, no meningismus, no LAD  CARD: RRR; S1 and S2 appreciated; no murmurs, no clicks, no rubs, no gallops RESP: Normal chest excursion without splinting or tachypnea; breath sounds clear and equal bilaterally; no wheezes, no rhonchi, no rales, no hypoxia or respiratory distress ABD/GI: Normal bowel sounds; non-distended; soft, non-tender, no rebound, no guarding GU: GU:  Normal external genitalia, circumcised male, normal penile shaft, no blood or discharge at the urethral meatus, no testicular masses or tenderness on exam, no scrotal masses or swelling, no hernias appreciated, 2+ femoral pulses bilaterally; no perineal erythema, warmth, subcutaneous air or crepitus;  no high riding testicle, normal bilateral cremasteric reflex BACK:  The back appears normal and is non-tender to palpation, there is no CVA tenderness; no midline spinal tenderness without step-off or deformity EXT: Normal ROM in all joints; non-tender to palpation; no edema; normal capillary refill; no cyanosis; no joint effusions, no erythema or warmth ; no calf tenderness or swelling; 2+ radial, DP, and femoral pulses bilaterally; no ecchymosis or swelling to either groin area or erythema, warmth, induration or fluctuance     SKIN: Normal color for age and race; warm NEURO: Moves all extremities equally, sensation to touch intact diffusely, cranial nerves 2-12 intact  PSYCH: [name] mood and manner are appropriate. Grooming and personal hygiene are appropriate.  MEDICAL DECISION MAKING: Patient here with diffuse body pain. Pain is worse with movement. He has no fever or any infectious signs on exam. No sign of cellulitis or septic arthritis. No signs of gout currently. His extremities are warm and well-perfused. He has normal sensation and is able to move his extremities although it is painful. He was recently admitted for the same and thought to have gout but most of his pain at that time was in his left arm. He has been on steroids without relief. He returns today because of uncontrolled pain and unable to get out of bed and walk.  Wife does report that he had a fall 2 days ago. He did hit his head, left elbow, left hip. We'll obtain x-rays of his hip, chest, lumbar spine. We'll also obtain CT of his head and cervical spine. He states he does not feel he needs an x-ray of his left elbow.  He states whenever he had pain prior to this fall.  No sign of any neurologic deficit on exam. His extremities are all warm and well-perfused. No history of any autoimmune disease.  Also complaining of chest pain shortness of breath. Recently underwent cardiac catheterization and had 3 drug-eluting stents  placed. EKG shows a paced rhythm. We'll obtain cardiac labs, chest x-ray.  We'll give IV fentanyl for pain and closely monitor patient.  ED  PROGRESS: Patient has had 2 negative troponins and a stable chest x-ray (mild vascular congestion).  Dr. Sallyanne Kuster with cardiology has seen the patient and agrees that this is not cardiac. He is concerned given the amount of pain the patient is an thinks he may need a medical admission for pain control and further workup.  Patient was just admitted for a workup for similar pain. Was seen by neurology. Was discharged on prednisone and colchicine for possible gout.  Patients lumbar spine x-ray, CT head and cervical spine show no acute injury. Possible avulsion fracture of the greater trochanter on hip x-ray. We'll obtain CT for further evaluation given he is having pain with sitting upright and movement of this hip.   4:15 AM  Pt's CT of the left hip shows no acute injury. Patient did have some improvement after IV fentanyl. Given dose of IV morphine and attempted to ambulate patient.  He cries out in pain when he is standing up but has been able to ambulate but does so with a shuffling gait. States that he has a walker at home that he can use. I have offered him admission for further workup for his diffuse pain and for pain control several times but he adamantly refuses. He is oriented 3 and has the capacity to make this decision. I recommend close follow-up with his outpatient provider which he agrees to. Discussed strict return precautions with patient. We'll discharge with prescription for Percocet to take as needed for pain. He verbalizes understanding and is comfortable with this plan.     Date: 05/14/2015 00:49 AM  Rate: 70  Rhythm: Atrial sensed ventricular paced rhythm  QRS Axis: normal  Intervals: Wide complex given this is a ventricular paced rhythm  ST/T Wave abnormalities: normal  Conduction Disutrbances: none  Narrative Interpretation: Atrial  sensed ventricular paced rhythm       I personally performed the services described in this documentation, which was scribed in my presence. The recorded information has been reviewed and is accurate.    [place], DO 05/14/15 0425

## 2015-05-14 ENCOUNTER — Emergency Department (HOSPITAL_COMMUNITY): Payer: 59

## 2015-05-14 ENCOUNTER — Encounter (HOSPITAL_COMMUNITY): Payer: Self-pay

## 2015-05-14 ENCOUNTER — Telehealth: Payer: Self-pay

## 2015-05-14 DIAGNOSIS — I442 Atrioventricular block, complete: Secondary | ICD-10-CM

## 2015-05-14 DIAGNOSIS — N184 Chronic kidney disease, stage 4 (severe): Secondary | ICD-10-CM | POA: Diagnosis not present

## 2015-05-14 DIAGNOSIS — I2510 Atherosclerotic heart disease of native coronary artery without angina pectoris: Secondary | ICD-10-CM | POA: Diagnosis not present

## 2015-05-14 DIAGNOSIS — Z950 Presence of cardiac pacemaker: Secondary | ICD-10-CM | POA: Diagnosis not present

## 2015-05-14 DIAGNOSIS — I5022 Chronic systolic (congestive) heart failure: Secondary | ICD-10-CM

## 2015-05-14 LAB — COMPREHENSIVE METABOLIC PANEL
ALT: 83 U/L — ABNORMAL HIGH (ref 17–63)
ANION GAP: 9 (ref 5–15)
AST: 61 U/L — AB (ref 15–41)
Albumin: 2.9 g/dL — ABNORMAL LOW (ref 3.5–5.0)
Alkaline Phosphatase: 99 U/L (ref 38–126)
BILIRUBIN TOTAL: 0.4 mg/dL (ref 0.3–1.2)
BUN: 101 mg/dL — ABNORMAL HIGH (ref 6–20)
CHLORIDE: 111 mmol/L (ref 101–111)
CO2: 20 mmol/L — ABNORMAL LOW (ref 22–32)
Calcium: 8.9 mg/dL (ref 8.9–10.3)
Creatinine, Ser: 2.64 mg/dL — ABNORMAL HIGH (ref 0.61–1.24)
GFR calc Af Amer: 25 mL/min — ABNORMAL LOW (ref >60)
GFR, EST NON AFRICAN AMERICAN: 21 mL/min — AB (ref >60)
Glucose, Bld: 121 mg/dL — ABNORMAL HIGH (ref 65–99)
POTASSIUM: 4.9 mmol/L (ref 3.5–5.1)
Sodium: 140 mmol/L (ref 135–145)
TOTAL PROTEIN: 5.9 g/dL — AB (ref 6.5–8.1)

## 2015-05-14 LAB — CBC WITH DIFFERENTIAL/PLATELET
BASOS ABS: 0 10*3/uL (ref 0.0–0.1)
BASOS PCT: 0 %
EOS PCT: 2 %
Eosinophils Absolute: 0.2 10*3/uL (ref 0.0–0.7)
HEMATOCRIT: 33.6 % — AB (ref 39.0–52.0)
Hemoglobin: 11.3 g/dL — ABNORMAL LOW (ref 13.0–17.0)
Lymphocytes Relative: 7 %
Lymphs Abs: 0.9 10*3/uL (ref 0.7–4.0)
MCH: 30.7 pg (ref 26.0–34.0)
MCHC: 33.6 g/dL (ref 30.0–36.0)
MCV: 91.3 fL (ref 78.0–100.0)
MONO ABS: 1.1 10*3/uL — AB (ref 0.1–1.0)
MONOS PCT: 9 %
NEUTROS ABS: 10.5 10*3/uL — AB (ref 1.7–7.7)
Neutrophils Relative %: 82 %
PLATELETS: 253 10*3/uL (ref 150–400)
RBC: 3.68 MIL/uL — ABNORMAL LOW (ref 4.22–5.81)
RDW: 14.2 % (ref 11.5–15.5)
WBC: 12.6 10*3/uL — ABNORMAL HIGH (ref 4.0–10.5)

## 2015-05-14 LAB — I-STAT TROPONIN, ED
TROPONIN I, POC: 0.05 ng/mL (ref 0.00–0.08)
TROPONIN I, POC: 0.07 ng/mL (ref 0.00–0.08)

## 2015-05-14 LAB — URINE MICROSCOPIC-ADD ON

## 2015-05-14 LAB — CUP PACEART REMOTE DEVICE CHECK
Battery Impedance: 595 Ohm
Battery Voltage: 2.78 V
Brady Statistic AP VP Percent: 69 %
Brady Statistic AP VS Percent: 0 %
Brady Statistic AS VP Percent: 31 %
Implantable Lead Implant Date: 20100112
Implantable Lead Location: 753859
Implantable Lead Model: 5076
Implantable Lead Model: 5076
Lead Channel Impedance Value: 438 Ohm
Lead Channel Pacing Threshold Amplitude: 1.125 V
Lead Channel Pacing Threshold Pulse Width: 0.4 ms
Lead Channel Sensing Intrinsic Amplitude: 1.4 mV
Lead Channel Setting Pacing Amplitude: 2.5 V
Lead Channel Setting Sensing Sensitivity: 2.8 mV
MDC IDC LEAD IMPLANT DT: 20100112
MDC IDC LEAD LOCATION: 753860
MDC IDC MSMT BATTERY REMAINING LONGEVITY: 71 mo
MDC IDC MSMT LEADCHNL RV IMPEDANCE VALUE: 556 Ohm
MDC IDC MSMT LEADCHNL RV PACING THRESHOLD AMPLITUDE: 0.625 V
MDC IDC MSMT LEADCHNL RV PACING THRESHOLD PULSEWIDTH: 0.4 ms
MDC IDC SESS DTM: 20161025125542
MDC IDC SET LEADCHNL RA PACING AMPLITUDE: 2.25 V
MDC IDC SET LEADCHNL RV PACING PULSEWIDTH: 0.4 ms
MDC IDC STAT BRADY AS VS PERCENT: 0 %

## 2015-05-14 LAB — URINALYSIS, ROUTINE W REFLEX MICROSCOPIC
BILIRUBIN URINE: NEGATIVE
Glucose, UA: NEGATIVE mg/dL
HGB URINE DIPSTICK: NEGATIVE
KETONES UR: NEGATIVE mg/dL
Leukocytes, UA: NEGATIVE
NITRITE: NEGATIVE
PROTEIN: 100 mg/dL — AB
Specific Gravity, Urine: 1.014 (ref 1.005–1.030)
UROBILINOGEN UA: 0.2 mg/dL (ref 0.0–1.0)
pH: 5 (ref 5.0–8.0)

## 2015-05-14 MED ORDER — DOCUSATE SODIUM 100 MG PO CAPS: 100.0000 mg | ORAL_CAPSULE | Freq: Two times a day (BID) | ORAL | Status: DC

## 2015-05-14 MED ORDER — OXYCODONE-ACETAMINOPHEN 5-325 MG PO TABS: 1.0000 | ORAL_TABLET | Freq: Four times a day (QID) | ORAL | Status: DC | PRN

## 2015-05-14 MED ORDER — MORPHINE SULFATE (PF) 4 MG/ML IV SOLN
4.0000 mg | Freq: Once | INTRAVENOUS | Status: AC
  Administered 2015-05-14: 4 mg via INTRAVENOUS
  Filled 2015-05-14: qty 1

## 2015-05-14 NOTE — Discharge Instructions (Signed)
°  I recommend close follow-up with your primary care doctor for further evaluation for your diffuse pain. If you begin feeling worse, fevers, chest pain or shortness of breath, vomiting that does not stop, blood in your stool, numbness or weakness on one side of your body, any other symptoms concerning to you, please return to the emergency department immediately. Please use your walker at all times when ambulating.  Pain Without a Known Cause WHAT IS PAIN WITHOUT A KNOWN CAUSE? Pain can occur in any part of the body and can range from mild to severe. Sometimes no cause can be found for why you are having pain. Some types of pain that can occur without a known cause include:   Headache.  Back pain.  Abdominal pain.  Neck pain. HOW IS PAIN WITHOUT A KNOWN CAUSE DIAGNOSED?  Your health care provider will try to find the cause of your pain. This may include:  Physical exam.  Medical history.  Blood tests.  Urine tests.  X-rays. If no cause is found, your health care provider may diagnose you with pain without a known cause.  IS THERE TREATMENT FOR PAIN WITHOUT A CAUSE?  Treatment depends on the kind of pain you have. Your health care provider may prescribe medicines to help relieve your pain.  WHAT CAN I DO AT HOME FOR MY PAIN?   Take medicines only as directed by your health care provider.  Stop any activities that cause pain. During periods of severe pain, bed rest may help.  Try to reduce your stress with activities such as yoga or meditation. Talk to your health care provider for other stress-reducing activity recommendations.  Exercise regularly, if approved by your health care provider.  Eat a healthy diet that includes fruits and vegetables. This may improve pain. Talk to your health care provider if you have any questions about your diet. WHAT IF MY PAIN DOES NOT GET BETTER?  If you have a painful condition and no reason can be found for the pain or the pain gets worse,  it is important to follow up with your health care provider. It may be necessary to repeat tests and look further for a possible cause.    This information is not intended to replace advice given to you by your health care provider. Make sure you discuss any questions you have with your health care provider.   Document Released: 03/24/2001 Document Revised: 07/20/2014 Document Reviewed: 11/14/2013 Elsevier Interactive Patient Education [employer].

## 2015-05-14 NOTE — ED Notes (Signed)
Admitting at bedside 

## 2015-05-14 NOTE — ED Notes (Signed)
Pts wife to pick pt up.

## 2015-05-14 NOTE — Telephone Encounter (Signed)
Patient spouse Rodney Farley is calling in regards to previous message. She would like for [place] to know that the patient was recently discharged for pain that was spreading rapidly from his leg to his hands. He was also diagnosed with severe gout. [name] states that patient is immobile and has been taking lyrica which has been causing hallucinations.

## 2015-05-14 NOTE — ED Notes (Signed)
Transported back to CT scan .

## 2015-05-14 NOTE — Telephone Encounter (Signed)
Rodney Farley from [facility] is calling to request a verbal for order for home health nursing for confusion and medication change.  Please call! [phone]

## 2015-05-14 NOTE — Consult Note (Signed)
Reason for Consult: Generalized pain, including chest pain  Requesting Physician: Ward  Cardiologist: [name]  HPI: This is a 81 y.o. male with a past medical history significant for long-standing coronary artery disease and recent multivessel percutaneous revascularization as well as complete heart block status post pacemaker, chronic systolic heart failure, history of transient ischemic attack, diabetes mellitus, hyperlipidemia, advanced chronic kidney disease (stage IV) presents to the emergency room with complaints of severe and widespread pain. On arrival he complained of some chest discomfort, but he also has neck pain, bilateral leg pain, flank pain, back pain. The slightest movement causes excruciating discomfort and he feels disabled. Point-of-care troponin is in normal range. ECG is nondiagnostic due to ventricular pacing.  Just 5 days ago he was admitted to the hospital with left forearm pain, diagnosed with gout and treated with prednisone and colchicine. He developed visual hallucinations while on steroid therapy and fell in his driveway while battling an army of 3 bears, several raccoons and a couple of small horses that were trying to rob him of his possessions. Note severely elevated erythrocyte sedimentation rate at almost 100.  On September 28 he underwent multivessel coronary revascularization with placement of drug-eluting stents to the mid LAD, second diagonal artery and third oblique marginal artery, with residual distal dominant left circumflex 70% stenosis left for medical therapy. Left ventricular ejection fraction was estimated by echo to be 35-40 percent earlier this year. Following his revascularization procedure he felt dramatically better with complete resolution of his complaints of exertional dyspnea. There is a plan for repeat echocardiography roughly 3 months following that procedure.  His renal function precludes the use of Ace inhibitors/angiotensin  receptor blockers and he is receiving hydralazine/nitrates.  His pacemaker is a Medtronic Adapta dual-chamber device implanted in 2010 and followed by Dr. Lovena Le. Last check performed in July showed normal device function with 70% atrial pacing and 100% ventricular pacing. No significant episodes of high ventricular rate of mode switch were recorded. Estimated generates a longevity was 6 years.  PMHx:  Past Medical History  Diagnosis Date  . COPD (chronic obstructive pulmonary disease) (HCC)     Severe  . Hypercholesterolemia   . Depression   . Coronary artery disease     a. s/p MI in 1994/1995 while in [place] s/p questionable PCI. 03/2015: progression of disease, for staged PCI.  [name] Hypertension   . Atrioventricular block, complete ([place])     a. 2010 s/p pacemaker.  . Pacemaker     medtronic  . Obesity   . TIA (transient ischemic attack) X 3  . Neuropathy (HCC)     IN LOWER EXTREMITIES  . GERD (gastroesophageal reflux disease)   . DOE (dyspnea on exertion) 03/28/2015  . CKD (chronic kidney disease), stage IV ([place])   . Type II diabetes mellitus ([place])   . Diabetic peripheral neuropathy ([place])   . Chronic systolic CHF (congestive heart failure) ([place])     a. LVEF 35-40% by echo 09/2014.  . MI (myocardial infarction) ([place]) 1994; 1995  . Anginal pain ([place])   . Sleep apnea     "sleeps w/humidifyer when he panics and gets short of breath" (04/08/2015)  . Anxiety   . Arm pain 05/08/2015    LEFT ARM   Past Surgical History  Procedure Laterality Date  . Tonsillectomy    . Cholecystectomy open  ~ 1977  . Insert / replace / remove pacemaker  07/2008    Complete heart block status post  DDD with good function  . Hiatal hernia repair  1977  . Cardiac catheterization N/A 03/29/2015    Procedure: Right/Left Heart Cath and Coronary Angiography;  Surgeon: Talik M [name], MD;  Location: [facility];  Service: Cardiovascular;  Laterality: N/A;  . Cardiac catheterization  1995    "after my  MI; put me on heart RX after cath"  . Cardiac catheterization N/A 04/09/2015    Procedure: Coronary Stent Intervention;  Surgeon: Tanveer M [name], MD;  Location: [facility];  Service: Cardiovascular;  Laterality: N/A;    FAMHx: Family History  Problem Relation Age of Onset  . Stroke Mother   . Leukemia Father   . Stroke Sister   . Heart attack Brother     SOCHx:  reports that he quit smoking about 7 years ago. His smoking use included Cigarettes. He has a 81 pack-year smoking history. He has never used smokeless tobacco. He reports that he drinks alcohol. He reports that he does not use illicit drugs.  ALLERGIES: Allergies  Allergen Reactions  . Bee Venom Anaphylaxis  . Zocor [Simvastatin] Nausea Only and Other (See Comments)    Headache with brand name only.  Can take the generic.    ROS: Pertinent items noted in HPI and remainder of comprehensive ROS otherwise negative.  HOME MEDICATIONS: No current facility-administered medications on file prior to encounter.   Current Outpatient Prescriptions on File Prior to Encounter  Medication Sig Dispense Refill  . aspirin EC 81 MG tablet Take 81 mg by mouth daily.    [name] atorvastatin (LIPITOR) 40 MG tablet Take 1 tablet (40 mg total) by mouth daily. 90 tablet 3  . carvedilol (COREG) 12.5 MG tablet Take 1 tablet (12.5 mg total) by mouth 2 (two) times daily with a meal. 60 tablet 6  . clopidogrel (PLAVIX) 75 MG tablet Take 1 tablet (75 mg total) by mouth daily with breakfast. 30 tablet 6  . colchicine 0.6 MG tablet Take 0.5 tablets (0.3 mg total) by mouth daily. 30 tablet 0  . furosemide (LASIX) 40 MG tablet Take 1 tablet (40 mg total) by mouth daily. 90 tablet 3  . hydrALAZINE (APRESOLINE) 25 MG tablet Take 1.5 tablets (37.5 mg total) by mouth 2 (two) times daily. 90 tablet 11  . Insulin Glargine (LANTUS SOLOSTAR) 100 UNIT/ML Solostar Pen Inject 60 Units into the skin daily at 10 pm. 5 pen PRN  . Insulin Pen Needle (B-D UF III  MINI PEN NEEDLES) 31G X 5 MM MISC Use to test blood sugar 3 times daily. Dx code: 250.02 100 each 11  . isosorbide mononitrate (IMDUR) 30 MG 24 hr tablet Take 1 tablet (30 mg total) by mouth daily. 30 tablet 6  . nitroGLYCERIN (NITROSTAT) 0.4 MG SL tablet Place 1 tablet (0.4 mg total) under the tongue every 5 (five) minutes x 3 doses as needed for chest pain. 25 tablet 3  . OVER THE COUNTER MEDICATION Place 1 drop into both eyes 3 (three) times daily. OTC Bausch-Lomb eye bath    . pantoprazole (PROTONIX) 40 MG tablet Take 1 tablet (40 mg total) by mouth daily. 30 tablet 6  . predniSONE (DELTASONE) 5 MG tablet Take 1 tablet (5 mg total) by mouth daily with breakfast. 4 tablet 0  . sertraline (ZOLOFT) 100 MG tablet Take 1.5 tablets (150 mg total) by mouth daily. 135 tablet 1  . tamsulosin (FLOMAX) 0.4 MG CAPS capsule TAKE 1 CAPSULE BY MOUTH DAILY. 30 capsule PRN  .  terazosin (HYTRIN) 1 MG capsule TAKE 1 CAPSULE BY MOUTH DAILY AT BEDTIME 90 capsule 1  . vitamin B-12 (CYANOCOBALAMIN) 1000 MCG tablet Take 1,000 mcg by mouth daily.      VITALS: Blood pressure 139/58, pulse 65, temperature 98.3 F (36.8 C), temperature source Oral, resp. rate 20, height 5' 7" (1.702 m), weight 227 lb (102.967 kg), SpO2 97 %.  PHYSICAL EXAM:  General: Alert, oriented x3, no distress. However trying to adjust his position in bed or turn leads to excruciating discomfort. Head: no evidence of trauma, PERRL, EOMI, no exophtalmos or lid lag, no myxedema, no xanthelasma; normal ears, nose and oropharynx Neck: Normal jugular venous pulsations and no hepatojugular reflux; brisk carotid pulses without delay and no carotid bruits Chest: clear to auscultation, no signs of consolidation by percussion or palpation, normal fremitus, symmetrical and full respiratory excursions Cardiovascular: normal position and quality of the apical impulse, regular rhythm, normal first heart sound and paradoxically split second heart sound, no rubs  or gallops, no murmur Abdomen: no tenderness or distention, no masses by palpation, no abnormal pulsatility or arterial bruits, normal bowel sounds, no hepatosplenomegaly Extremities: no clubbing, cyanosis;  no edema; 2+ radial, ulnar and brachial pulses bilaterally; 2+ right femoral, posterior tibial and dorsalis pedis pulses; 2+ left femoral, posterior tibial and dorsalis pedis pulses; no subclavian or femoral bruits Neurological: grossly nonfocal   LABS  CBC  Recent Labs  05/11/15 0609 05/14/15 0037  WBC 17.5* 12.6*  NEUTROABS  --  10.5*  HGB 10.3* 11.3*  HCT 30.3* 33.6*  MCV 90.7 91.3  PLT 195 194   Basic Metabolic Panel  Recent Labs  05/11/15 0609 05/14/15 0037  NA 132* 140  K 4.4 4.9  CL 98* 111  CO2 19* 20*  GLUCOSE 215* 121*  BUN 89* 101*  CREATININE 3.72* 2.64*  CALCIUM 8.7* 8.9   Liver Function Tests  Recent Labs  05/14/15 0037  AST 61*  ALT 83*  ALKPHOS 99  BILITOT 0.4  PROT 5.9*  ALBUMIN 2.9*   No results for input(s): LIPASE, AMYLASE in the last 72 hours. Cardiac Enzymes No results for input(s): CKTOTAL, CKMB, CKMBINDEX, TROPONINI in the last 72 hours. BNP Invalid input(s): POCBNP D-Dimer No results for input(s): DDIMER in the last 72 hours. Hemoglobin A1C No results for input(s): HGBA1C in the last 72 hours. Fasting Lipid Panel No results for input(s): CHOL, HDL, LDLCALC, TRIG, CHOLHDL, LDLDIRECT in the last 72 hours. Thyroid Function Tests No results for input(s): TSH, T4TOTAL, T3FREE, THYROIDAB in the last 72 hours.  Invalid input(s): FREET3    IMAGING: Dg Chest 2 View  05/14/2015  CLINICAL DATA:  Recent fall EXAM: CHEST - 2 VIEW COMPARISON:  05/08/2015 FINDINGS: Cardiac shadow is again enlarged. Pacing device is again seen and stable. Diffuse interstitial changes are again noted bilaterally. Portion of this is related to vascular congestion. No focal infiltrate or sizable effusion is noted. Degenerative changes of thoracic spine are  seen. IMPRESSION: Mild vascular congestion with interstitial changes stable from the prior exam. Electronically Signed   By: Inez Catalina M.D.   On: 05/14/2015 00:50   Dg Lumbar Spine Complete  05/14/2015  CLINICAL DATA:  Recent fall with low back pain, initial encounter EXAM: LUMBAR SPINE - COMPLETE 4+ VIEW COMPARISON:  None. FINDINGS: Five lumbar type vertebral bodies are well visualized. Vertebral body height is well maintained. No pars defects are noted. Mild degenerative anterolisthesis of L4 on L5 is seen. Diffuse aortic calcifications are noted. Calcification  is noted in the right upper quadrant likely related to cholelithiasis. IMPRESSION: Degenerative change. Cholelithiasis. Electronically Signed   By: Inez Catalina M.D.   On: 05/14/2015 00:49   Ct Head Wo Contrast  05/14/2015  CLINICAL DATA:  Fall 2 days ago with neck pain and headaches EXAM: CT HEAD WITHOUT CONTRAST CT CERVICAL SPINE WITHOUT CONTRAST TECHNIQUE: Multidetector CT imaging of the head and cervical spine was performed following the standard protocol without intravenous contrast. Multiplanar CT image reconstructions of the cervical spine were also generated. COMPARISON:  05/08/2015 FINDINGS: CT HEAD FINDINGS The bony calvarium is intact. Mild atrophic changes and chronic white matter ischemic changes are seen. No findings to suggest acute hemorrhage, acute infarction or space-occupying mass lesion are noted. CT CERVICAL SPINE FINDINGS Seven cervical segments are well visualized. Vertebral body height is well maintained. Osteophytic changes are noted from C2 to the T1. No acute fracture or acute facet abnormality is noted. No soft tissue abnormality is seen. IMPRESSION: CT of the head: Chronic atrophic and ischemic changes without acute abnormality. CT of cervical spine: Degenerative changes without acute abnormality. Electronically Signed   By: Inez Catalina M.D.   On: 05/14/2015 01:15   Ct Cervical Spine Wo Contrast  05/14/2015   CLINICAL DATA:  Fall 2 days ago with neck pain and headaches EXAM: CT HEAD WITHOUT CONTRAST CT CERVICAL SPINE WITHOUT CONTRAST TECHNIQUE: Multidetector CT imaging of the head and cervical spine was performed following the standard protocol without intravenous contrast. Multiplanar CT image reconstructions of the cervical spine were also generated. COMPARISON:  05/08/2015 FINDINGS: CT HEAD FINDINGS The bony calvarium is intact. Mild atrophic changes and chronic white matter ischemic changes are seen. No findings to suggest acute hemorrhage, acute infarction or space-occupying mass lesion are noted. CT CERVICAL SPINE FINDINGS Seven cervical segments are well visualized. Vertebral body height is well maintained. Osteophytic changes are noted from C2 to the T1. No acute fracture or acute facet abnormality is noted. No soft tissue abnormality is seen. IMPRESSION: CT of the head: Chronic atrophic and ischemic changes without acute abnormality. CT of cervical spine: Degenerative changes without acute abnormality. Electronically Signed   By: Inez Catalina M.D.   On: 05/14/2015 01:15   Ct Hip Left Wo Contrast  05/14/2015  CLINICAL DATA:  Left hip pain.  Fell 2 days ago. EXAM: CT OF THE LEFT HIP WITHOUT CONTRAST TECHNIQUE: Multidetector CT imaging of the left hip was performed according to the standard protocol. Multiplanar CT image reconstructions were also generated. COMPARISON:  Radiographs 05/14/2015 FINDINGS: The left hip is intact. There is no fracture. Left pubic rami are intact. Pubic symphysis and left sacroiliac joint are intact where seen. No acute soft tissue abnormality is evident. IMPRESSION: Negative for acute fracture. Electronically Signed   By: Andreas Newport M.D.   On: 05/14/2015 02:32   Dg Hip Unilat With Pelvis 1v Left  05/14/2015  CLINICAL DATA:  Recent fall with left hip pain, initial encounter EXAM: DG HIP (WITH OR WITHOUT PELVIS) 1V*L* COMPARISON:  None. FINDINGS: There is lucency noted in the  greater trochanter which may be related to an avulsion the greater trochanter. Pelvic ring is otherwise intact. Degenerative changes of the lumbar spine are seen. No soft tissue abnormality is noted. IMPRESSION: Mild irregularity in the superior aspect of the greater trochanter. An undisplaced avulsion may be present. Electronically Signed   By: Inez Catalina M.D.   On: 05/14/2015 00:52   Post-Intervention Diagram  ECG: Atrial sensed ventricular paced  TELEMETRY:  atrial sensed ventricular paced  IMPRESSION: 1. Generalized body pain may represent polymyalgia rheumatica. This would explain the severely elevated ESR. It would also explain the immediate and excellent response that he had to initial treatment with steroids. However, repeat treatment with steroids and is to be undertaken cautiously due to the psychiatric side effects that he experienced. 2. Recent multivessel coronary stenting without current evidence for acute coronary syndrome. I doubt that his current generalized pain has anything to do with coronary disease. 3. Chronic systolic heart failure without evidence of acute exacerbation. He does not appear hypervolemic. Functional status cannot be assessed due to his multiple musculoskeletal complaints 4. Stage IV chronic kidney disease. Creatinine today is significantly improved from hospital discharge 2 days ago. 5. Complete heart block/pacemaker dependent with normally functioning dual-chamber pacemaker  RECOMMENDATION: 1.  No plan for further cardiac workup at this time. Further outpatient evaluation as outlined by Dr. [name]. 2. Consider further workup and treating for possible polymyalgia rheumatica 3. He appears severely deconditioned and with the additional complaints of pain, does not appear to be able to perform self-care  Time Spent Directly with Patient: 45 minutes  Sanda Klein, MD, [facility] [phone] office [phone]  pager   05/14/2015, 3:54 AM

## 2015-05-14 NOTE — ED Notes (Addendum)
Pt ambulated in hall with this RN. When attempting to get the pt up and out of bed the pt gasped and moaned in pain. While walking the pt was shuffling and often had to take standing breaks to rest. Pt stated that "every step is a 4 out of 10 pain". Pt states Upon returning the pt to bed the pt gasped and moaned in pain. Admitting provider aware, Dr. Leonides Schanz aware.

## 2015-05-14 NOTE — ED Notes (Signed)
Patient transported to X-ray 

## 2015-05-15 ENCOUNTER — Telehealth: Payer: Self-pay | Admitting: Cardiology

## 2015-05-15 ENCOUNTER — Encounter: Payer: Self-pay | Admitting: Cardiology

## 2015-05-15 ENCOUNTER — Other Ambulatory Visit: Payer: Self-pay

## 2015-05-15 ENCOUNTER — Telehealth: Payer: Self-pay

## 2015-05-15 NOTE — Telephone Encounter (Signed)
Received FMLA Forms ([employer]) for Dr [name] to review, complete and sign.  Forms given to Nigel Bridgeman, RN for Dr [name] to complete and sign.  lp

## 2015-05-15 NOTE — Telephone Encounter (Signed)
PATIENT'S SPOUSE CALLING TO SAY THAT HIS BLOOD SUGAR IS 164 AND THAT IS VERY HIGH FOR HIM, HE USUALLY RUNS UNDER 100,PLEASE ADVISE   BEST PHONE FOR PT IS [phone]   [facility]

## 2015-05-16 NOTE — Telephone Encounter (Signed)
Should he RTC? Any suggestions.

## 2015-05-16 NOTE — Telephone Encounter (Signed)
Spoke with patient's wife and states that glucose is now normal. Saw that pt taking prednisone for gout and wife confirms. Likely the cause of spike is glucose.

## 2015-05-20 ENCOUNTER — Other Ambulatory Visit: Payer: Self-pay | Admitting: *Deleted

## 2015-05-20 NOTE — Patient Outreach (Signed)
[facility]) Care Management  05/20/2015  Rodney Farley April 25, 1932 [id number]   Transition of care (2nd Outreach call)  RN spoke with primary caregiver who indicates pt has made great process as indicated by pt's spouse with PT from [place]. States she is a Marine scientist who works in the CHF area and aware of how pt needs to be on fluid restrictions. RN discussed the HF zones and the importance of weight everyday and documenting all weights for providers to view. Caregiver states pt weight daily but she was only able to recall today's weight at 217 lbs and no denies pt in any distress or with symptoms at this time. States she has not been able to schedule a follow up appointment with the primary doctor but will contact them tomorrow. RN offered to contact her provider to assist with scheduled the post-op hospital visit however caregiver decline RN's assistance and states she would contact the provider"s office tomorrow and schedule the appointment.  No home visits however receptive to ongoing follow up calls for transition of care. Will follow up next week.   Raina Mina, RN Care Management Coordinator [place] [facility] Main Office [phone]

## 2015-05-21 ENCOUNTER — Telehealth: Payer: Self-pay | Admitting: Cardiology

## 2015-05-21 NOTE — Telephone Encounter (Signed)
Received signed FMLA- [employer] forms back from Dr [name].  Notified patient forms ready for pick up. lp

## 2015-05-22 ENCOUNTER — Other Ambulatory Visit: Payer: Self-pay | Admitting: *Deleted

## 2015-05-22 NOTE — Patient Outreach (Signed)
[name]) Care Management  05/22/2015  Rodney Farley [birth year] [id number]   Transition of care follow up call.  RN spoke with pt concerning [facility] and purpose of today's call. Pt requested RN speak with his spouse (Rodney Farley). RN spoke with pt's spouse and inquired on his ongoing recovery and offered home visist is needed to address any medical issues if needed however at this time pt continues to recovery well with no issues mentioned. States she needs to make an appointment with pt's primary Dr. [name]. RN offered to assist with arranging this appointment however caregiver indicated she would call and make the appointment this week. No additional inquired or request at this time as RN informed pt and caregiver that this RN would continue to follow up over the next two weeks with ongoing transition of care inquired (both receptive).  Raina Mina, RN Care Management Coordinator [place] [facility] Main Office [phone]

## 2015-05-22 NOTE — Patient Outreach (Signed)
[facility]) Care Management  05/21/2015  GEROGE CRISMAN 11-May-1932 [id number]   RN received a call back from pt's spouse indicating she works 12 hours shift. Therefore it may be difficult to catch up with her concerning her spouse. RN will follow once again to response to the call that has been placed with the primary caregiver of this pt.   Raina Mina, RN Care Management Coordinator [place] [facility] Main Office [phone]

## 2015-05-23 ENCOUNTER — Ambulatory Visit (HOSPITAL_COMMUNITY): Payer: 59

## 2015-05-27 ENCOUNTER — Ambulatory Visit (HOSPITAL_COMMUNITY): Payer: 59

## 2015-05-28 ENCOUNTER — Other Ambulatory Visit: Payer: Self-pay | Admitting: *Deleted

## 2015-05-28 NOTE — Patient Outreach (Signed)
[facility]) Care Management  05/28/2015  Rodney Farley January 14, 1932 [id number]  Transition of Care (thrid outreach)  RN attempted to reach pt however unsuccessful. RN left an approval HIPAA message requesting a call back. Will inquire further at that time.   Raina Mina, RN Care Management Coordinator [place] [facility] Main Office [phone]

## 2015-05-29 ENCOUNTER — Ambulatory Visit (HOSPITAL_COMMUNITY): Payer: 59

## 2015-05-29 ENCOUNTER — Other Ambulatory Visit: Payer: Self-pay | Admitting: Internal Medicine

## 2015-05-30 ENCOUNTER — Telehealth: Payer: Self-pay

## 2015-05-30 NOTE — Telephone Encounter (Signed)
Dr. [name], pharm wanted me to let you know that we are prescribing both Flomax and Hytrin for this pt. Wants to know if he should be on both or one vs the other [phone]

## 2015-05-31 ENCOUNTER — Other Ambulatory Visit: Payer: Self-pay | Admitting: *Deleted

## 2015-05-31 ENCOUNTER — Ambulatory Visit (HOSPITAL_COMMUNITY): Payer: 59

## 2015-05-31 ENCOUNTER — Encounter: Payer: Self-pay | Admitting: Internal Medicine

## 2015-05-31 ENCOUNTER — Ambulatory Visit (INDEPENDENT_AMBULATORY_CARE_PROVIDER_SITE_OTHER): Payer: 59 | Admitting: Internal Medicine

## 2015-05-31 VITALS — BP 116/69 | HR 76 | Temp 98.4°F | Resp 16 | Ht 67.0 in | Wt 217.0 lb

## 2015-05-31 DIAGNOSIS — E1165 Type 2 diabetes mellitus with hyperglycemia: Secondary | ICD-10-CM

## 2015-05-31 DIAGNOSIS — E1122 Type 2 diabetes mellitus with diabetic chronic kidney disease: Secondary | ICD-10-CM | POA: Diagnosis not present

## 2015-05-31 DIAGNOSIS — Z794 Long term (current) use of insulin: Secondary | ICD-10-CM | POA: Diagnosis not present

## 2015-05-31 DIAGNOSIS — E669 Obesity, unspecified: Secondary | ICD-10-CM | POA: Diagnosis not present

## 2015-05-31 DIAGNOSIS — I10 Essential (primary) hypertension: Secondary | ICD-10-CM | POA: Diagnosis not present

## 2015-05-31 DIAGNOSIS — Z95820 Peripheral vascular angioplasty status with implants and grafts: Secondary | ICD-10-CM

## 2015-05-31 DIAGNOSIS — N183 Chronic kidney disease, stage 3 (moderate): Secondary | ICD-10-CM

## 2015-05-31 DIAGNOSIS — N289 Disorder of kidney and ureter, unspecified: Secondary | ICD-10-CM

## 2015-05-31 DIAGNOSIS — Z959 Presence of cardiac and vascular implant and graft, unspecified: Secondary | ICD-10-CM | POA: Diagnosis not present

## 2015-05-31 DIAGNOSIS — IMO0002 Reserved for concepts with insufficient information to code with codable children: Secondary | ICD-10-CM

## 2015-05-31 DIAGNOSIS — I255 Ischemic cardiomyopathy: Secondary | ICD-10-CM | POA: Diagnosis not present

## 2015-05-31 NOTE — Progress Notes (Signed)
   Subjective:    Patient ID: Rodney Farley, male    DOB: December 11, 1931, 81 y.o.   MRN: [id number]  HPIf/u recent hosp His main concern is needing ref to cardiac Rehab as he feels so much better and is eager to get started--they say he needs ref from PCP or card even tho this was all sched at discharge He feels great today-vastly impr since stent  Card dx- -ischemic cardiomyopathy complete heart block status post pacemaker -Coronary artery disease status post stent, recent with significant improvement -Unstable angina resolved after stent -Dyspnea on exertion resolved after stent -Peripheral edema improved after stent -Systolic congestive heart pain, chronic on Coreg  He is also here for follow-up blood pressure and diabetes control. His early morning sugars are somewhat erratic but are mainly below 140. He continues with 60 un of Lantus hs. HomeBP borderline  His recent hospitalization was due to a psychotic episode with a fall all thought secondary to steroids given for his previously acute presentation with joint pain. This was his first episode of gout. See last OV here 10/25 for acute LUE pain thought to be vascular given recent probems. Admitted. Mystery for etio til uric 9.3--solumed plus oral pred resolved but had halluc with pred  Last labs 11/1 cr 2.6, alb 2.9, AST,ALT sl incr. Hgb 11.3, ESR 97 10/28 Last echo 3/16=Impressions: - Mildly dilated LV with moderate systolic dysfunction, EF 61-53%. Wall motion abnormalities as noted above. Normal RV size and systolic function. Mild MR.  A1C 7.4 10/25  Review of Systems  Constitutional: Negative for activity change, appetite change, fatigue and unexpected weight change.  HENT: Negative for trouble swallowing.   Eyes: Negative for visual disturbance.  Respiratory: Negative for shortness of breath and wheezing.   Cardiovascular: Negative for chest pain, palpitations and leg swelling.  Gastrointestinal: Negative for abdominal  pain.       Objective:   Physical Exam BP 116/69 mmHg  Pulse 76  Temp(Src) 98.4 F (36.9 C)  Resp 16  Ht $R'5\' 7"'vE$  (1.702 m)  Wt 217 lb (98.431 kg)  BMI 33.98 kg/m2 Wt Readings from Last 3 Encounters:  05/31/15 217 lb (98.431 kg)  05/13/15 227 lb (102.967 kg)  05/07/15 221 lb (100.245 kg)   HEENT-cl No nose, T-meg Ht reg w/out M, G Lungs clear extr -no edema//sens intact pl surf both feet No pain ankle or upper extr Mood good!!       Assessment & Plan:  Renal insufficiency-stable post hosp x2. Saw renal in hosp  Obesity-Losing!!!!  Ischemic cardiomyopathy with Essential hypertension,CHF, CHB s/p pacemaker and newly placed stent after unstable angina-needs cardiac rehab  Uncontrolled type 2 diabetes mellitus with stage 3 chronic kidney disease, with long-term current use of insulin (HCC)--stable--needs home am Glu with dose adj with 3-4 d over 140  HTN-borderline control/to f/u Dr [name] next week. ? Follow w curr meds and continue wt loss --on hydral and coreg

## 2015-05-31 NOTE — Patient Outreach (Signed)
[facility]) Care Management  05/31/2015  LANZ MOR 04-26-1932 [id number]   Return call from transition of care call earlier this week. RN spoke with pt's caregiver (spouse) who indicates pt is actually at his primary doctor's office for his post-op hospital visit today. Spouse states pt is doing well with no major issues. Reports pt does not appear to have any swelling but unable to report any weights. RN strongly encouraged primary caregiver to instruct pt to weight daily and document all weights for his providers to view if needed. Offered to follow up on Monday concerning pt's progress and requested caregiver inform pt to weight and present her current weights (spouse understandings). No other inquires or request at this time.   Raina Mina, RN Care Management Coordinator [place] [facility] Main Office [phone]

## 2015-06-03 ENCOUNTER — Ambulatory Visit (HOSPITAL_COMMUNITY): Payer: 59

## 2015-06-03 ENCOUNTER — Telehealth: Payer: Self-pay | Admitting: Cardiology

## 2015-06-03 NOTE — Telephone Encounter (Signed)
Received records from [facility] for appointment on 06/05/15 with Dr [name].  Records given to [facility] (medical records) for Dr Doug Sou schedule on 05/2315. lp

## 2015-06-04 ENCOUNTER — Other Ambulatory Visit: Payer: Self-pay | Admitting: *Deleted

## 2015-06-04 ENCOUNTER — Encounter: Payer: Self-pay | Admitting: *Deleted

## 2015-06-04 NOTE — Patient Outreach (Addendum)
[place] [facility]) Care Management  06/03/2015  Rodney Farley Sep 07, 1931 [id number]   Transition of care (4th week)  Rn spoke with pt today concerning his ongoing recovery. Pt states he is doing "much better" with good reports when visiting he doctors and when obtaining his labs. All reports were "good" as pt denies any swelling, no SOB and today's weight reported 214 lbs and yesterday 216 again with no encountered symptoms.  Pt verified attendance to all medical appointments and understands the importance of weighing daily in monitoring his HF. RN discussed the HF zones and educated pt on the importance of remain in the GREEN zone to prevent distressful and acute episodes of distress. RN extended the offer once again for community home visits as pt opt to decline at this time indicating he has been ordered to attend the out patient rehabilitation cardiac program starting on Wednesday. Pt very excited and looking for to improving his overall health. RN verified pt understandings the risk involved if he does not continue to monitor his HF daily with low sodium dietary intake and recognizing symptoms earlier with interventions to avoid hospitalizations. RN verified pt has RN contact number if pt changes his mind concerning community home visits. RN also extended the offer if pt feels ongoing the [facility] services would be beneficial  in the future. RN has informed pt that today would be the last day as the follow up assessment over the past 4 weeks since his discharge. Pt verbalized an understanding and very apprepicative for the weekly follow up calls of inquire. Case will be closed with all goals met.  Raina Mina, RN Care Management Coordinator [place] [facility] Main Office [phone]

## 2015-06-05 ENCOUNTER — Ambulatory Visit (HOSPITAL_COMMUNITY): Payer: 59

## 2015-06-05 ENCOUNTER — Encounter: Payer: Self-pay | Admitting: Cardiology

## 2015-06-05 ENCOUNTER — Ambulatory Visit (INDEPENDENT_AMBULATORY_CARE_PROVIDER_SITE_OTHER): Payer: 59 | Admitting: Cardiology

## 2015-06-05 VITALS — BP 140/70 | HR 84 | Ht 67.0 in | Wt 220.1 lb

## 2015-06-05 DIAGNOSIS — I5022 Chronic systolic (congestive) heart failure: Secondary | ICD-10-CM | POA: Diagnosis not present

## 2015-06-05 DIAGNOSIS — I2510 Atherosclerotic heart disease of native coronary artery without angina pectoris: Secondary | ICD-10-CM

## 2015-06-05 DIAGNOSIS — N184 Chronic kidney disease, stage 4 (severe): Secondary | ICD-10-CM

## 2015-06-05 DIAGNOSIS — I442 Atrioventricular block, complete: Secondary | ICD-10-CM

## 2015-06-05 NOTE — Progress Notes (Signed)
CARDIOLOGY OFFICE NOTE  Date:  06/05/2015    Rodney Farley Date of Birth: 1932/06/28 Medical Record [id number]  PCP:  [name], MD  Cardiologist:  [name]    Chief Complaint  Patient presents with  . Follow-up    no chest pain, no shortness of breath, occassional edema,  no pain in legs, no cramping in legs, no lightheadedness, no dizziness    History of Present Illness: Rodney Farley is a 81 y.o. male who is seen for follow up CAD and CHF.   He has a history of CAD (s/p MI in 1994/1995 while in [place] s/p questionable PCI, recent progression of disease), CHB 2010 s/p pacemaker, CKD stage IV, DM, HTN, HLD, remote tobacco abuse, COPD, moderate sleep apnea, and chronic systolic CHF.    He recently had progression of DOE, chest discomfort and chest pounding, limiting his level of exertion. 2D Echo 09/2014 showed EF 35-40% with +WMA, grade 1 DD, mild MR. PFTs looked remarkably good. Dr. [name] planned to do a [facility] and admitted him for overnight hydration but Cr increased to 2.44 and procedure was cancelled. He was then evaluated by renal with return in Cr to 1.8. He was then seen by urgent care and complained of swelling in his feet thus Lasix 40mg  daily was added with improvement. He was then admitted for a second attempt on 03/28/15 and Cr was 2.14 (stable compared to prior outpatient 2.15). LE duplex negative for DVT. Cath showed severe 3V CAD - the mid LAD, second diagonal, and third OM branches appeared suitable for PCI +/- distal LCx. The other option would be CABG. After discussion concerning risk and benefits he prefered to be treated with PCI. He was hydrated post-cath and started on Plavix 75mg  daily. F/u BMET on 9/19 showed Cr of 2.46 thus Lasix was held x 2 days then resumed at 40mg  daily. F/u level showed BUN/Cr 35/2.26.    Patient underwent Multivessel PCI including stenting of the mid LAD, second diagonal, and third OM with DES on 04/09/15.  Distal 70%  circumflex lesion will be treated medically.He was started on aspirin and Plavix. Hydralazine was added for afterload reduction and better blood pressure control.   Since his procedure he has had a marked improvement in his symptoms that he feels is miraculous. No chest pain. Breathing is much better. Able to walk in the neighborhood now. He did have an acute gout attack and had a reaction to steroids with hallucinations and confusion. Gout symptoms have resolved. Renal function has been stable.   Past Medical History  Diagnosis Date  . COPD (chronic obstructive pulmonary disease) (HCC)     Severe  . Hypercholesterolemia   . Depression   . Coronary artery disease     a. s/p MI in 1994/1995 while in [place] s/p questionable PCI. 03/2015: progression of disease, for staged PCI.  [name] Hypertension   . Atrioventricular block, complete ([place])     a. 2010 s/p pacemaker.  . Pacemaker     medtronic  . Obesity   . TIA (transient ischemic attack) X 3  . Neuropathy (HCC)     IN LOWER EXTREMITIES  . GERD (gastroesophageal reflux disease)   . DOE (dyspnea on exertion) 03/28/2015  . CKD (chronic kidney disease), stage IV ([place])   . Type II diabetes mellitus ([place])   . Diabetic peripheral neuropathy ([place])   . Chronic systolic CHF (congestive heart failure) ([place])     a. LVEF 35-40% by  echo 09/2014.  . MI (myocardial infarction) ([place]) 1994; 1995  . Anginal pain ([place])   . Sleep apnea     "sleeps w/humidifyer when he panics and gets short of breath" (04/08/2015)  . Anxiety   . Arm pain 05/08/2015    LEFT ARM    Past Surgical History  Procedure Laterality Date  . Tonsillectomy    . Cholecystectomy open  ~ 1977  . Insert / replace / remove pacemaker  07/2008    Complete heart block status post DDD with good function  . Hiatal hernia repair  1977  . Cardiac catheterization N/A 03/29/2015    Procedure: Right/Left Heart Cath and Coronary Angiography;  Surgeon: Jerrin M [name], MD;  Location: [place] [facility];  Service: Cardiovascular;  Laterality: N/A;  . Cardiac catheterization  1995    "after my MI; put me on heart RX after cath"  . Cardiac catheterization N/A 04/09/2015    Procedure: Coronary Stent Intervention;  Surgeon: Calem M [name], MD;  Location: [place] [facility];  Service: Cardiovascular;  Laterality: N/A;     Medications: Current Outpatient Prescriptions  Medication Sig Dispense Refill  . aspirin EC 81 MG tablet Take 81 mg by mouth daily.    [name] atorvastatin (LIPITOR) 40 MG tablet Take 1 tablet (40 mg total) by mouth daily. 90 tablet 3  . carvedilol (COREG) 12.5 MG tablet Take 1 tablet (12.5 mg total) by mouth 2 (two) times daily with a meal. 60 tablet 6  . clopidogrel (PLAVIX) 75 MG tablet Take 1 tablet (75 mg total) by mouth daily with breakfast. 30 tablet 6  . colchicine 0.6 MG tablet Take 0.5 tablets (0.3 mg total) by mouth daily. 30 tablet 0  . furosemide (LASIX) 40 MG tablet Take 1 tablet (40 mg total) by mouth daily. 90 tablet 3  . hydrALAZINE (APRESOLINE) 25 MG tablet Take 1.5 tablets (37.5 mg total) by mouth 2 (two) times daily. 90 tablet 11  . Insulin Glargine (LANTUS SOLOSTAR) 100 UNIT/ML Solostar Pen Inject 60 Units into the skin daily at 10 pm. 5 pen PRN  . Insulin Pen Needle (B-D UF III MINI PEN NEEDLES) 31G X 5 MM MISC Use to test blood sugar 3 times daily. Dx code: 250.02 100 each 11  . isosorbide mononitrate (IMDUR) 30 MG 24 hr tablet Take 1 tablet (30 mg total) by mouth daily. 30 tablet 6  . nitroGLYCERIN (NITROSTAT) 0.4 MG SL tablet Place 1 tablet (0.4 mg total) under the tongue every 5 (five) minutes x 3 doses as needed for chest pain. 25 tablet 3  . OVER THE COUNTER MEDICATION Place 1 drop into both eyes 3 (three) times daily. OTC B[place]ch[place]omb eye bath    . pantoprazole (PROTONIX) 40 MG tablet Take 1 tablet (40 mg total) by mouth daily. 30 tablet 6  . sertraline (ZOLOFT) 100 MG tablet Take 1.5 tablets (150 mg total) by mouth daily. 135 tablet 1  . vitamin B-12  (CYANOCOBALAMIN) 1000 MCG tablet Take 1,000 mcg by mouth daily.     No current facility-administered medications for this visit.    Allergies: Allergies  Allergen Reactions  . Bee Venom Anaphylaxis  . Prednisone Other (See Comments)    hallucinations  . Zocor [Simvastatin] Nausea Only and Other (See Comments)    Headache with brand name only.  Can take the generic.    Social History: The patient  reports that he quit smoking about 7 years ago. His smoking use included Cigarettes. He has  a 81 pack-year smoking history. He has never used smokeless tobacco. He reports that he drinks alcohol. He reports that he does not use illicit drugs.   Family History: [name] family history includes Heart attack in his brother; Leukemia in his father; Stroke in his mother and sister.   Review of Systems: Please see the history of present illness.   Otherwise, the review of systems is positive for none.   All other systems are reviewed and negative.   Physical Exam: VS:  BP 140/70 mmHg  Pulse 84  Ht 5\' 7"  (1.702 m)  Wt 99.848 kg (220 lb 2 oz)  BMI 34.47 kg/m2 .  BMI Body mass index is 34.47 kg/(m^2).  Wt Readings from Last 3 Encounters:  06/05/15 99.848 kg (220 lb 2 oz)  05/31/15 98.431 kg (217 lb)  05/13/15 102.967 kg (227 lb)    General: Pleasant. Well developed, well nourished and in no acute distress.  HEENT: Normal. Neck: Supple, no JVD, carotid bruits, or masses noted.  Cardiac: Regular rate and rhythm. No murmurs, rubs, or gallops. No edema.  Respiratory:  Lungs are clear to auscultation bilaterally with normal work of breathing.  GI: Soft and nontender.  MS: No deformity or atrophy. Gait and ROM intact. Skin: Warm and dry. Color is normal.  Neuro:  Strength and sensation are intact and no gross focal deficits noted.  Psych: Alert, appropriate and with normal affect.   LABORATORY DATA:  EKG:  EKG is not ordered today.  Lab Results  Component Value Date   WBC 12.6*  05/14/2015   HGB 11.3* 05/14/2015   HCT 33.6* 05/14/2015   PLT 253 05/14/2015   GLUCOSE 121* 05/14/2015   CHOL 215* 09/19/2014   TRIG 247* 09/19/2014   HDL 41 09/19/2014   LDLCALC 125* 09/19/2014   ALT 83* 05/14/2015   AST 61* 05/14/2015   NA 140 05/14/2015   K 4.9 05/14/2015   CL 111 05/14/2015   CREATININE 2.64* 05/14/2015   BUN 101* 05/14/2015   CO2 20* 05/14/2015   TSH 1.606 03/28/2015   PSA 0.55 09/19/2014   INR 1.09 05/08/2015   HGBA1C 7.4* 05/07/2015   MICROALBUR 68.3* 09/19/2014    BNP (last 3 results) No results for input(s): BNP in the last 8760 hours.  ProBNP (last 3 results) No results for input(s): PROBNP in the last 8760 hours.   Other Studies Reviewed Today:  Echo Study Conclusions from 09/2014  - Left ventricle: The cavity size was mildly dilated. Wall thickness was normal. Systolic function was moderately reduced. The estimated ejection fraction was in the range of 35% to 40%. Mid anteroseptal, apical septal and true apex akinesis. Mid inferoseptal and apical lateral hypokinesis. Doppler parameters are consistent with abnormal left ventricular relaxation (grade 1 diastolic dysfunction). - Aortic valve: There was no stenosis. - Mitral valve: Mildly calcified annulus. There was mild regurgitation. - Left atrium: The atrium was mildly dilated. - Right ventricle: The cavity size was normal. Pacer wire or catheter noted in right ventricle. Systolic function was normal. - Tricuspid valve: Peak RV-RA gradient (S): 21 mm Hg. - Pulmonary arteries: PA peak pressure: 24 mm Hg (S). - Inferior vena cava: The vessel was normal in size. The respirophasic diameter changes were in the normal range (= 50%), consistent with normal central venous pressure.  Impressions:  - Mildly dilated LV with moderate systolic dysfunction, EF [id number]. Wall motion abnormalities as noted above. Normal RV size and systolic function. Mild MR.  Cardiac  Cath/PCI  Conclusion from 03/2015     Acute Mrg lesion, 100% stenosed.  Ost 1st Diag to 1st Diag lesion, 90% stenosed.  Dist Cx lesion, 70% stenosed.  2nd Diag lesion, 90% stenosed. There is a 0% residual stenosis post intervention.  A drug-eluting stent was placed.  Mid LAD lesion, 90% stenosed. There is a 0% residual stenosis post intervention.  A drug-eluting stent was placed.  3rd Mrg lesion, 90% stenosed. There is a 0% residual stenosis post intervention.  A drug-eluting stent was placed.  1. Successful stenting of the mid LAD, second diagonal, and OM3 with DES.  Plan: observe overnight with hydration. Assess renal function in AM. Plan to resume lasix at DC due to elevated EDP. DAPT for one year. Anticipate DC in am if no complications. Will need to reassess LV function in 3 months. If EF remains low consider upgrading pacemaker to a CRT/ICD device.      Assessment/Plan: 1. CAD - s/p  left heart catheterization revealing Mid LAD lesion, 90% stenosed, 2nd Diag lesion, 90% stenosed, 3rd Mrg lesion, 90% stenosed. He received drug-eluting stents to each lesion. Distal 70% circumflex lesion will be treated medically. Marked symptomatic improvement. He is on DAPT with Plavix and aspirin - will plan for  long term. He is OK to resume cardiac Rehab.   2. ICM - will plan repeat Echo prior to next visit in 3 months.   3. Chronic systolic HF - compensated -  He has a good understanding about weight daily and restricting salt.   4. HTN - controlled.   5. HLD - on statin therapy  Current medicines are reviewed with the patient today.  The patient does not have concerns regarding medicines other than what has been noted above.  The following changes have been made:  See above.  Labs/ tests ordered today include:    Orders Placed This Encounter  Procedures  . ECHOCARDIOGRAM COMPLETE     Disposition:   FU with Dr. [name] 3 months with Echo   Patient is agreeable to this  plan and will call if any problems develop in the interim.   Signed: Raykwon [name] MD, [facility]    06/05/2015 5:57 PM

## 2015-06-05 NOTE — Patient Instructions (Signed)
Resume Cardiac Rehab   Continue your current therapy  I will see you in 3 months.

## 2015-06-05 NOTE — Telephone Encounter (Signed)
Rodney Farley from [facility] is calling because she hasn't heard anything.

## 2015-06-10 ENCOUNTER — Ambulatory Visit (HOSPITAL_COMMUNITY): Payer: 59

## 2015-06-10 ENCOUNTER — Encounter: Payer: Self-pay | Admitting: Internal Medicine

## 2015-06-12 ENCOUNTER — Ambulatory Visit (HOSPITAL_COMMUNITY): Payer: 59

## 2015-06-13 ENCOUNTER — Telehealth: Payer: Self-pay

## 2015-06-13 NOTE — Telephone Encounter (Signed)
Received fax from [facility] saying that pt reports his colchicine dosage has increased to 1/2 tab TID, and they would like a Rx for this from Dr [name]. Dr [name], I don't see that we have Rxd this for him at all yet, but you did discuss it at last [place]. It looks like the original prescriber, Dr Pamelia Hoit, is maybe a hospitalist and meds need to be managed by PCP ongoing. Pended Rx as they req'd, but don't see any other documentation of increased dose?

## 2015-06-13 NOTE — Telephone Encounter (Signed)
Okay to refill as documented He was put on this dose at discharge You can call in with 3 months of refills

## 2015-06-14 ENCOUNTER — Other Ambulatory Visit (HOSPITAL_COMMUNITY): Payer: 59 | Admitting: Cardiology

## 2015-06-14 ENCOUNTER — Ambulatory Visit (HOSPITAL_COMMUNITY): Payer: 59

## 2015-06-14 ENCOUNTER — Other Ambulatory Visit: Payer: Self-pay

## 2015-06-14 ENCOUNTER — Ambulatory Visit (HOSPITAL_COMMUNITY): Payer: 59 | Attending: Cardiovascular Disease

## 2015-06-14 DIAGNOSIS — N184 Chronic kidney disease, stage 4 (severe): Secondary | ICD-10-CM | POA: Diagnosis not present

## 2015-06-14 DIAGNOSIS — Z87891 Personal history of nicotine dependence: Secondary | ICD-10-CM | POA: Insufficient documentation

## 2015-06-14 DIAGNOSIS — I10 Essential (primary) hypertension: Secondary | ICD-10-CM | POA: Insufficient documentation

## 2015-06-14 DIAGNOSIS — E119 Type 2 diabetes mellitus without complications: Secondary | ICD-10-CM | POA: Insufficient documentation

## 2015-06-14 DIAGNOSIS — E785 Hyperlipidemia, unspecified: Secondary | ICD-10-CM | POA: Diagnosis not present

## 2015-06-14 DIAGNOSIS — I517 Cardiomegaly: Secondary | ICD-10-CM | POA: Diagnosis not present

## 2015-06-14 DIAGNOSIS — I5022 Chronic systolic (congestive) heart failure: Secondary | ICD-10-CM | POA: Diagnosis not present

## 2015-06-14 DIAGNOSIS — I2510 Atherosclerotic heart disease of native coronary artery without angina pectoris: Secondary | ICD-10-CM | POA: Diagnosis present

## 2015-06-14 DIAGNOSIS — R29898 Other symptoms and signs involving the musculoskeletal system: Secondary | ICD-10-CM | POA: Insufficient documentation

## 2015-06-14 DIAGNOSIS — I442 Atrioventricular block, complete: Secondary | ICD-10-CM | POA: Diagnosis not present

## 2015-06-14 MED ORDER — PERFLUTREN LIPID MICROSPHERE
1.0000 mL | Freq: Once | INTRAVENOUS | Status: AC
  Administered 2015-06-14: 2 mL via INTRAVENOUS

## 2015-06-17 ENCOUNTER — Telehealth: Payer: Self-pay | Admitting: Cardiology

## 2015-06-17 ENCOUNTER — Ambulatory Visit (HOSPITAL_COMMUNITY): Payer: 59

## 2015-06-17 NOTE — Telephone Encounter (Signed)
New Message  Pt calling about inc in [place]- change from hospital. Please call back and discuss.

## 2015-06-17 NOTE — Telephone Encounter (Signed)
No answer. LMTCB

## 2015-06-18 MED ORDER — COLCHICINE 0.6 MG PO TABS: 0.3000 mg | ORAL_TABLET | Freq: Three times a day (TID) | ORAL | Status: DC

## 2015-06-19 ENCOUNTER — Ambulatory Visit (HOSPITAL_COMMUNITY): Payer: 59

## 2015-06-20 ENCOUNTER — Encounter (HOSPITAL_COMMUNITY)
Admission: RE | Admit: 2015-06-20 | Discharge: 2015-06-20 | Disposition: A | Discharge status: Home / Self Care | Payer: 59 | Source: Ambulatory Visit | Attending: Cardiology | Admitting: Cardiology

## 2015-06-20 DIAGNOSIS — Z959 Presence of cardiac and vascular implant and graft, unspecified: Secondary | ICD-10-CM | POA: Insufficient documentation

## 2015-06-20 NOTE — Telephone Encounter (Signed)
Returned call to patient.He stated colchicine refill already be taken care of.

## 2015-06-20 NOTE — Progress Notes (Signed)
Cardiac Rehab Medication Review by a Pharmacist  Does the patient  feel that his/her medications are working for him/her?  yes  Has the patient been experiencing any side effects to the medications prescribed?  no  Does the patient measure his/her own blood pressure or blood glucose at home?  yes   Does the patient have any problems obtaining medications due to transportation or finances?   no  Understanding of regimen: good Understanding of indications: good Potential of compliance: fair    Pharmacist comments:  Rodney Farley is a pleasant 81 yo M presenting with an up-to-date medication list. He reports fair compliance with his medications stating that he misses 1-2 doses per week. We did review in detail the indications for each of his medications and the importance of consistent use. He does not report experiencing any specific side effects to any of the medications he is currently taking.   Ruta Hinds. Velva Harman, PharmD, BCPS, CPP Clinical Pharmacist Pager: [phone] Phone: [phone] 06/20/2015 9:14 AM

## 2015-06-21 ENCOUNTER — Ambulatory Visit (HOSPITAL_COMMUNITY): Payer: 59

## 2015-06-24 ENCOUNTER — Ambulatory Visit (HOSPITAL_COMMUNITY): Payer: 59

## 2015-06-24 ENCOUNTER — Other Ambulatory Visit: Payer: Self-pay | Admitting: Internal Medicine

## 2015-06-24 ENCOUNTER — Encounter (HOSPITAL_COMMUNITY)
Admission: RE | Admit: 2015-06-24 | Discharge: 2015-06-24 | Disposition: A | Discharge status: Home / Self Care | Payer: 59 | Source: Ambulatory Visit | Attending: Cardiology | Admitting: Cardiology

## 2015-06-24 DIAGNOSIS — Z959 Presence of cardiac and vascular implant and graft, unspecified: Secondary | ICD-10-CM | POA: Diagnosis present

## 2015-06-24 LAB — GLUCOSE, CAPILLARY
GLUCOSE-CAPILLARY: 104 mg/dL — AB (ref 65–99)
GLUCOSE-CAPILLARY: 121 mg/dL — AB (ref 65–99)

## 2015-06-24 NOTE — Progress Notes (Signed)
Pt started exercise today at the 8:15 Phase II cardiac rehab program.  Pt tolerated light exercise without difficulty. VSS, telemetry- SR with wide QRS/paced beats.  This is present on the most recent 12 lead ekg - 05/14/15/ Pt. Asymptomatic with some shortness of breath while walking during the walk test.  Pt readily admits that he walks his dog for exercise but is not walking at the pace he walked today..  Medication list reconciled.  Pt verbalized compliance with medications and denies barriers to compliance. Pt wife is a CNA here at [facility] and she helps him with his medication. PSYCHOSOCIAL ASSESSMENT:  PHQ-1. Pt express some depression but more anxiety about his financial security.  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  Pt has adapted to budget constraints that he did not have in previous years. At times he is bothered by this but realizes at the age of 89 he does not have any other options or source of income.  For anxiety pt admits that right when he had his cardiac event he stayed mostly to himself and this tendency led to having thoughts in his mind that he would have a reoccurrence. Pt takes Zoloft and feels this is working well for him. This has gotten better the more he interacts with others.  Pt is hopeful that participating in a cardiac rehab program will continue to help.  Pt exhibits hopeful outlook with supportive family. Will do periodic mental well being checks with pt.   Pt does not have any hobbies but feels he would like to do more with wife and is encouraged that he will have the energy to do so.   Pt cardiac rehab  Goal for short and long term goal  is  To continue well being with walking further .  Pt encouraged to participate in dedicated home exercise on the days he is not in rehab to increase ability to achieve these goals.   Pt long term goal is to improve stamina.  Will monitor pt progress reports on  activities he is doing at home. Pt encouraged to attend nutrition and education classes. Pt oriented to exercise equipment and routine.  Understanding verbalized. Cherre Huger, BSN

## 2015-06-26 ENCOUNTER — Ambulatory Visit (INDEPENDENT_AMBULATORY_CARE_PROVIDER_SITE_OTHER): Payer: 59 | Admitting: Internal Medicine

## 2015-06-26 ENCOUNTER — Encounter (HOSPITAL_COMMUNITY)
Admission: RE | Admit: 2015-06-26 | Discharge: 2015-06-26 | Disposition: A | Discharge status: Home / Self Care | Payer: 59 | Source: Ambulatory Visit | Attending: Cardiology | Admitting: Cardiology

## 2015-06-26 ENCOUNTER — Ambulatory Visit (HOSPITAL_COMMUNITY): Payer: 59

## 2015-06-26 ENCOUNTER — Encounter: Payer: Self-pay | Admitting: Internal Medicine

## 2015-06-26 VITALS — BP 104/58 | HR 71 | Temp 97.8°F | Resp 18 | Ht 67.5 in | Wt 221.0 lb

## 2015-06-26 DIAGNOSIS — N183 Chronic kidney disease, stage 3 (moderate): Secondary | ICD-10-CM | POA: Diagnosis not present

## 2015-06-26 DIAGNOSIS — E1122 Type 2 diabetes mellitus with diabetic chronic kidney disease: Secondary | ICD-10-CM

## 2015-06-26 DIAGNOSIS — E1165 Type 2 diabetes mellitus with hyperglycemia: Secondary | ICD-10-CM | POA: Diagnosis not present

## 2015-06-26 DIAGNOSIS — M109 Gout, unspecified: Secondary | ICD-10-CM | POA: Insufficient documentation

## 2015-06-26 DIAGNOSIS — E785 Hyperlipidemia, unspecified: Secondary | ICD-10-CM | POA: Diagnosis not present

## 2015-06-26 DIAGNOSIS — Z794 Long term (current) use of insulin: Secondary | ICD-10-CM

## 2015-06-26 DIAGNOSIS — R7989 Other specified abnormal findings of blood chemistry: Secondary | ICD-10-CM | POA: Diagnosis not present

## 2015-06-26 LAB — COMPREHENSIVE METABOLIC PANEL
ALBUMIN: 3.8 g/dL (ref 3.6–5.1)
ALK PHOS: 68 U/L (ref 40–115)
ALT: 16 U/L (ref 9–46)
AST: 18 U/L (ref 10–35)
BUN: 37 mg/dL — AB (ref 7–25)
CALCIUM: 9 mg/dL (ref 8.6–10.3)
CO2: 21 mmol/L (ref 20–31)
Chloride: 106 mmol/L (ref 98–110)
Creat: 2.18 mg/dL — ABNORMAL HIGH (ref 0.70–1.11)
Glucose, Bld: 100 mg/dL — ABNORMAL HIGH (ref 65–99)
POTASSIUM: 4.6 mmol/L (ref 3.5–5.3)
Sodium: 139 mmol/L (ref 135–146)
TOTAL PROTEIN: 6.5 g/dL (ref 6.1–8.1)
Total Bilirubin: 0.4 mg/dL (ref 0.2–1.2)

## 2015-06-26 LAB — POCT GLYCOSYLATED HEMOGLOBIN (HGB A1C): HEMOGLOBIN A1C: 6.9

## 2015-06-26 LAB — LIPID PANEL
CHOL/HDL RATIO: 4 ratio (ref <=5.0)
Cholesterol: 171 mg/dL (ref 125–200)
HDL: 43 mg/dL (ref >=40)
LDL Cholesterol: 65 mg/dL (ref <130)
TRIGLYCERIDES: 317 mg/dL — AB (ref <150)
VLDL: 63 mg/dL — AB (ref <30)

## 2015-06-26 LAB — CBC WITH DIFFERENTIAL/PLATELET
BASOS ABS: 0 10*3/uL (ref 0.0–0.1)
Basophils Relative: 0 % (ref 0–1)
Eosinophils Absolute: 0.4 10*3/uL (ref 0.0–0.7)
Eosinophils Relative: 4 % (ref 0–5)
HEMATOCRIT: 33.3 % — AB (ref 39.0–52.0)
HEMOGLOBIN: 11.6 g/dL — AB (ref 13.0–17.0)
LYMPHS ABS: 2.7 10*3/uL (ref 0.7–4.0)
LYMPHS PCT: 29 % (ref 12–46)
MCH: 30.4 pg (ref 26.0–34.0)
MCHC: 34.8 g/dL (ref 30.0–36.0)
MCV: 87.2 fL (ref 78.0–100.0)
MPV: 9.3 fL (ref 8.6–12.4)
Monocytes Absolute: 0.7 10*3/uL (ref 0.1–1.0)
Monocytes Relative: 8 % (ref 3–12)
NEUTROS PCT: 59 % (ref 43–77)
Neutro Abs: 5.4 10*3/uL (ref 1.7–7.7)
Platelets: 212 10*3/uL (ref 150–400)
RBC: 3.82 MIL/uL — ABNORMAL LOW (ref 4.22–5.81)
RDW: 14.7 % (ref 11.5–15.5)
WBC: 9.2 10*3/uL (ref 4.0–10.5)

## 2015-06-26 LAB — URIC ACID: Uric Acid, Serum: 6.9 mg/dL (ref 4.0–7.8)

## 2015-06-26 NOTE — Progress Notes (Signed)
Pt arrived to 8:15 class.  Pt reported that he was having diarrhea stools.  Questioned when his last stool was.  He stated 30 minutes ago. Pt remarked that his stool was projectile and he and his wife had to clean the entire bathroom.  Pt advised that he would not be able to exercise today.  Reviewed sick policy with pt.  Pt verbalized understanding that he must be symptom free for 48 hours before he may return to exercise.  Pt notified his wife who is working day shift today here at the hospital.  Maurice Small RN, BSN

## 2015-06-26 NOTE — Progress Notes (Signed)
Subjective:    Patient ID: Rodney Farley, male    DOB: Dec 17, 1931, 81 y.o.   MRN: [id number]  HPIfu Patient Active Problem List   Diagnosis Date Noted  . Diabetes type 2, uncontrolled (HCC)---adjusting insulin over last 6-8 weeks---60u lantus now 132 avg bs 08/05/2012  . AKI (acute kidney injury) (HCC)---s/p--see hosp   . Chronic systolic CHF (congestive heart failure) ([place]) 04/08/2015  . CKD (chronic kidney disease) stage 4, GFR 15-29 ml/min (HCC) 03/04/2015  . CAD (coronary artery disease), native coronary artery, in all vessels, significant 09/13/2014  . Depression--better 08/05/2012  . Sleep apnea---doing well 08/05/2012  . HTN (hypertension) 08/05/2012  . Obesity   . Pacemaker     -  Gout on colchicine---stable  Current outpatient prescriptions:  .  aspirin EC 81 MG tablet, Take 81 mg by mouth daily., Disp: , Rfl:  .  atorvastatin (LIPITOR) 40 MG tablet, Take 1 tablet (40 mg total) by mouth daily., Disp: 90 tablet, Rfl: 3 .  carvedilol (COREG) 12.5 MG tablet, Take 1 tablet (12.5 mg total) by mouth 2 (two) times daily with a meal., Disp: 60 tablet, Rfl: 6 .  clopidogrel (PLAVIX) 75 MG tablet, Take 1 tablet (75 mg total) by mouth daily with breakfast., Disp: 30 tablet, Rfl: 6 .  colchicine 0.6 MG tablet, Take 0.5 tablets (0.3 mg total) by mouth 3 (three) times daily., Disp: 90 tablet, Rfl: 3 .  furosemide (LASIX) 40 MG tablet, Take 1 tablet (40 mg total) by mouth daily., Disp: 90 tablet, Rfl: 3 .  hydrALAZINE (APRESOLINE) 25 MG tablet, Take 1.5 tablets (37.5 mg total) by mouth 2 (two) times daily., Disp: 90 tablet, Rfl: 11 .  Insulin Pen Needle (B-D UF III MINI PEN NEEDLES) 31G X 5 MM MISC, Use to test blood sugar 3 times daily. Dx code: 34.02, Disp: 100 each, Rfl: 11 .  isosorbide mononitrate (IMDUR) 30 MG 24 hr tablet, Take 1 tablet (30 mg total) by mouth daily., Disp: 30 tablet, Rfl: 6 .  LANTUS SOLOSTAR 100 UNIT/ML Solostar Pen, INJECT 60 UNITS UNDER THE SKIN ONCE DAILY,  Disp: 15 mL, Rfl: 1 .  nitroGLYCERIN (NITROSTAT) 0.4 MG SL tablet, Place 1 tablet (0.4 mg total) under the tongue every 5 (five) minutes x 3 doses as needed for chest pain., Disp: 25 tablet, Rfl: 3 .  OVER THE COUNTER MEDICATION, Place 1 drop into both eyes 3 (three) times daily. OTC Bausch-Lomb eye bath, Disp: , Rfl:  .  pantoprazole (PROTONIX) 40 MG tablet, Take 1 tablet (40 mg total) by mouth daily., Disp: 30 tablet, Rfl: 6 .  sertraline (ZOLOFT) 100 MG tablet, Take 1.5 tablets (150 mg total) by mouth daily., Disp: 135 tablet, Rfl: 1 .  vitamin B-12 (CYANOCOBALAMIN) 1000 MCG tablet, Take 1,000 mcg by mouth daily., Disp: , Rfl:   Upbeat about cardiac rehab--one session so far  Review of Systems  Constitutional: Negative for fever, appetite change and unexpected weight change.  Eyes: Negative for visual disturbance.  Respiratory: Negative for shortness of breath and wheezing.   Cardiovascular: Negative for chest pain, palpitations and leg swelling.  Gastrointestinal: Negative for abdominal pain.  Genitourinary: Negative for difficulty urinating.  Neurological: Negative for dizziness, weakness and headaches.       Objective:   Physical Exam  Constitutional: He is oriented to person, place, and time. He appears well-developed and well-nourished. No distress.  HENT:  Head: Normocephalic and atraumatic.  Eyes: Pupils are equal, round, and reactive to light.  Neck: Normal range of motion.  Cardiovascular: Normal rate and regular rhythm.   Pulmonary/Chest: Effort normal. No respiratory distress.  Musculoskeletal: Normal range of motion.  Neurological: He is alert and oriented to person, place, and time.  Skin: Skin is warm and dry.  Psychiatric: He has a normal mood and affect. His behavior is normal.  Nursing note and vitals reviewed.  BP 104/58 mmHg  Pulse 71  Temp(Src) 97.8 F (36.6 C) (Oral)  Resp 18  Ht 5' 7.5" (1.715 m)  Wt 221 lb (100.245 kg)  BMI 34.08 kg/m2  SpO2  98% Wt Readings from Last 3 Encounters:  06/26/15 221 lb (100.245 kg)  06/20/15 221 lb 12.5 oz (100.6 kg)  06/05/15 220 lb 2 oz (99.848 kg)       Assessment & Plan:  Uncontrolled type 2 diabetes mellitus with stage 3 chronic kidney disease, with long-term current use of insulin ([place]) - Plan: POCT glycosylated hemoglobin (Hb A1C)  Abnormal LFTs (see last Labs)- Plan: Comprehensive metabolic panel  Hyperlipidemia - Plan: Lipid panel  Gout without tophus, unspecified cause, unspecified chronicity, unspecified site - Plan: Uric Acid now on colchicine   addendum Results for orders placed or performed in visit on 06/26/15  CBC with Differential/Platelet  Result Value Ref Range   WBC 9.2 4.0 - 10.5 K/uL   RBC 3.82 (L) 4.22 - 5.81 MIL/uL   Hemoglobin 11.6 (L) 13.0 - 17.0 g/dL   HCT 33.3 (L) 39.0 - 52.0 %   MCV 87.2 78.0 - 100.0 fL   MCH 30.4 26.0 - 34.0 pg   MCHC 34.8 30.0 - 36.0 g/dL   RDW 14.7 11.5 - 15.5 %   Platelets 212 150 - 400 K/uL   MPV 9.3 8.6 - 12.4 fL   Neutrophils Relative % 59 43 - 77 %   Neutro Abs 5.4 1.7 - 7.7 K/uL   Lymphocytes Relative 29 12 - 46 %   Lymphs Abs 2.7 0.7 - 4.0 K/uL   Monocytes Relative 8 3 - 12 %   Monocytes Absolute 0.7 0.1 - 1.0 K/uL   Eosinophils Relative 4 0 - 5 %   Eosinophils Absolute 0.4 0.0 - 0.7 K/uL   Basophils Relative 0 0 - 1 %   Basophils Absolute 0.0 0.0 - 0.1 K/uL   Smear Review Criteria for review not met   Comprehensive metabolic panel  Result Value Ref Range   Sodium 139 135 - 146 mmol/L   Potassium 4.6 3.5 - 5.3 mmol/L   Chloride 106 98 - 110 mmol/L   CO2 21 20 - 31 mmol/L   Glucose, Bld 100 (H) 65 - 99 mg/dL   BUN 37 (H) 7 - 25 mg/dL   Creat 2.18 (H) 0.70 - 1.11 mg/dL   Total Bilirubin 0.4 0.2 - 1.2 mg/dL   Alkaline Phosphatase 68 40 - 115 U/L   AST 18 10 - 35 U/L   ALT 16 9 - 46 U/L   Total Protein 6.5 6.1 - 8.1 g/dL   Albumin 3.8 3.6 - 5.1 g/dL   Calcium 9.0 8.6 - 10.3 mg/dL  Lipid panel  Result Value Ref  Range   Cholesterol 171 125 - 200 mg/dL   Triglycerides 317 (H) <150 mg/dL   HDL 43 >=40 mg/dL   Total CHOL/HDL Ratio 4.0 <=5.0 Ratio   VLDL 63 (H) <30 mg/dL   LDL Cholesterol 65 <130 mg/dL  Uric Acid  Result Value Ref Range   Uric Acid, Serum 6.9 4.0 - 7.8  mg/dL  POCT glycosylated hemoglobin (Hb A1C)  Result Value Ref Range   Hemoglobin A1C 6.9    Great results at this point --abn lft resolved

## 2015-06-27 NOTE — Progress Notes (Signed)
I have cancelled this patient appointment in Jan. per Dr. Ninfa Meeker request.

## 2015-06-28 ENCOUNTER — Encounter (HOSPITAL_COMMUNITY): Payer: 59

## 2015-06-28 ENCOUNTER — Ambulatory Visit (HOSPITAL_COMMUNITY): Payer: 59

## 2015-07-01 ENCOUNTER — Ambulatory Visit (HOSPITAL_COMMUNITY): Payer: 59

## 2015-07-01 ENCOUNTER — Encounter (HOSPITAL_COMMUNITY)
Admission: RE | Admit: 2015-07-01 | Discharge: 2015-07-01 | Disposition: A | Discharge status: Home / Self Care | Payer: 59 | Source: Ambulatory Visit | Attending: Cardiology | Admitting: Cardiology

## 2015-07-01 DIAGNOSIS — Z959 Presence of cardiac and vascular implant and graft, unspecified: Secondary | ICD-10-CM | POA: Diagnosis not present

## 2015-07-01 LAB — GLUCOSE, CAPILLARY
GLUCOSE-CAPILLARY: 186 mg/dL — AB (ref 65–99)
Glucose-Capillary: 131 mg/dL — ABNORMAL HIGH (ref 65–99)

## 2015-07-01 NOTE — Progress Notes (Signed)
QUALITY OF LIFE SCORE REVIEW  Pt completed Quality of Life survey as a participant in Cardiac Rehab. Scores 21.0 or below are considered low. Pt score very low in several areas Overall 20 and Health and Function 15.   Patient quality of life slightly altered by physical constraints which limits ability to perform as prior to recent cardiac illness. Pt is not currently having chest pain however has lower back pain that limits abilities at times.   Pt mostly expresses financial worries and concerns related to significant loss from stock market crashes, most recently [employer].  Pt is having difficulty adjusting to new financial status.  Pt reports this causes him daily worry and concern with feelings of hopelessness.  Pt denies suicidal thoughts mostly due to faith based beliefs and lack of courage to harm himself.  Pt states he has previously been treated by psychiatrist Dr. Erling Cruz who is now retired.  Pt accepting of offer to schedule appt with new psychiatrist.  Fortunately, pt does have supportive family and he enjoys spending quality time with children and adult grandchildren.   Offered emotional support and reassurance.  Will continue to monitor and intervene as necessary.

## 2015-07-02 NOTE — Progress Notes (Signed)
I have cancelled the appointment in January but left the follow up appointment in March.  Maudie Mercury

## 2015-07-03 ENCOUNTER — Ambulatory Visit (HOSPITAL_COMMUNITY): Payer: 59

## 2015-07-03 ENCOUNTER — Encounter (HOSPITAL_COMMUNITY)
Admission: RE | Admit: 2015-07-03 | Discharge: 2015-07-03 | Disposition: A | Discharge status: Home / Self Care | Payer: 59 | Source: Ambulatory Visit | Attending: Cardiology | Admitting: Cardiology

## 2015-07-03 DIAGNOSIS — Z959 Presence of cardiac and vascular implant and graft, unspecified: Secondary | ICD-10-CM | POA: Diagnosis not present

## 2015-07-03 LAB — GLUCOSE, CAPILLARY: Glucose-Capillary: 168 mg/dL — ABNORMAL HIGH (ref 65–99)

## 2015-07-05 ENCOUNTER — Telehealth: Payer: Self-pay | Admitting: Cardiology

## 2015-07-05 ENCOUNTER — Encounter (HOSPITAL_COMMUNITY)
Admission: RE | Admit: 2015-07-05 | Discharge: 2015-07-05 | Disposition: A | Discharge status: Home / Self Care | Payer: 59 | Source: Ambulatory Visit | Attending: Cardiology | Admitting: Cardiology

## 2015-07-05 ENCOUNTER — Ambulatory Visit (HOSPITAL_COMMUNITY): Payer: 59

## 2015-07-05 DIAGNOSIS — Z959 Presence of cardiac and vascular implant and graft, unspecified: Secondary | ICD-10-CM | POA: Diagnosis not present

## 2015-07-05 LAB — GLUCOSE, CAPILLARY: Glucose-Capillary: 220 mg/dL — ABNORMAL HIGH (ref 65–99)

## 2015-07-05 NOTE — Progress Notes (Signed)
Reviewed home exercise guidelines with patient including endpoints, temperature precautions, target heart rate and rate of perceived exertion. Pt plans to walk 15 minutes twice daily as his mode of home exercise. Pt voices understanding of instructions given. Sol Passer, MS, ACSM CCEP

## 2015-07-05 NOTE — Progress Notes (Signed)
Pt c/o dizziness today at cardiac rehab, associated with mild headache. Pt reports these symptoms are not uncommon for him. At first symptoms relieved with rest, however returned after pt participated in seated exercises.  orthostat VS obtained:  BP:  121/47 HR 82 lying, 93/53 HR 80 sitting, 93/48 HR 79 standing.  Pt given gatorade.  Recheck BP:  98/62 sitting, 90/56 standing. Pt reports relief of symptoms.  Also noted pt weight up 1.5 kg today at cardiac rehab. Pt reports dyspnea same as usual. No edema. Lungs clear.  Pt denies increased sodium intake or missed medications.  PC to Dr. Doug Sou nurse, Ivin Booty to advise. Rehab report faxed for review. Ivin Booty will discuss with MD and further advise pt.  Pt instructed to continue daily home weights, continue low sodium diet and current medications until advised by office.  Pt verbalized understanding.  Pt denies dizziness or headache upon leaving cardiac rehab.

## 2015-07-05 NOTE — Telephone Encounter (Signed)
Information reviewed with Dr Claiborne Billings Per Dr Mariana Arn lasix tomorrow 07/06/15 ,then starting 07/07/15 decrease 20 mg daily ( 1/2 tablet of furosemide)  RN called patient's home- spoke to wife instruction given to wife. She verbalized understanding.

## 2015-07-05 NOTE — Telephone Encounter (Signed)
Spoke to [employer] patient became dizzy after recovery in rehab  Orthostatic blood pressure taken 120/47 sitting--- standing 93/53 gatorade given 98/60 sitting -----standing 90/50 Patient has taken his morning medications. Per Arville Go , weight is up about 4lbs (1.1kg) Will contact patient after deferring to D.O.D

## 2015-07-08 ENCOUNTER — Ambulatory Visit (INDEPENDENT_AMBULATORY_CARE_PROVIDER_SITE_OTHER): Payer: 59 | Admitting: Family Medicine

## 2015-07-08 ENCOUNTER — Telehealth: Payer: Self-pay

## 2015-07-08 VITALS — BP 120/70 | HR 73 | Temp 97.9°F | Resp 18 | Ht 68.0 in | Wt 226.6 lb

## 2015-07-08 DIAGNOSIS — R600 Localized edema: Secondary | ICD-10-CM

## 2015-07-08 DIAGNOSIS — I10 Essential (primary) hypertension: Secondary | ICD-10-CM

## 2015-07-08 DIAGNOSIS — R0602 Shortness of breath: Secondary | ICD-10-CM

## 2015-07-08 DIAGNOSIS — I5020 Unspecified systolic (congestive) heart failure: Secondary | ICD-10-CM

## 2015-07-08 LAB — BASIC METABOLIC PANEL
BUN: 48 mg/dL — ABNORMAL HIGH (ref 7–25)
CHLORIDE: 105 mmol/L (ref 98–110)
CO2: 29 mmol/L (ref 20–31)
Calcium: 9.3 mg/dL (ref 8.6–10.3)
Creat: 1.78 mg/dL — ABNORMAL HIGH (ref 0.70–1.11)
Glucose, Bld: 138 mg/dL — ABNORMAL HIGH (ref 65–99)
POTASSIUM: 5.2 mmol/L (ref 3.5–5.3)
SODIUM: 140 mmol/L (ref 135–146)

## 2015-07-08 MED ORDER — FUROSEMIDE 20 MG PO TABS
ORAL_TABLET | ORAL | Status: DC
Start: 2015-07-08 — End: 2015-09-24

## 2015-07-08 NOTE — Progress Notes (Signed)
[facility] [address], [place] [address] [phone]  Date:  07/08/2015   Name:  Rodney Farley   DOB:  Jul 31, 1931   MRN:  [id number]  PCP:  [name], MD    Chief Complaint: Hypertension   History of Present Illness:  Rodney Farley is a 80 y.o. very pleasant male patient who presents with the following:  Here today with concern about his BP.  He has noted it running higher than usual over the last 2-3 weeks at home.  Today it was the highest it has been and he got a reading of approx 190/115 so he was worried and came in.   He received 3 stents in 03/2015 and has been in cardiac rehab following this procedure.  He was at cardiac rehab on 12/23 and had an episode of feeling lightheaded; he was noted to have orthostatic hypotension.   Staff called Dr. [name] who had his decrease his lasix to 20 mg a day.  He has not had any further lightheadedness since then.  However he now notes that his BLE edema is more pronounced and his legs have been weeping a bit No current HA but he noted that his head "was throbbing" on the 23rd.   No CP or SOB.  No PND  He does have DM and uses the solostar pen His weight has been gradually increasing over the last 3 weeks- part of this may be holiday weight gain but he is also concerned about CHF exacerbation Most recent EF on 12/2 was 25-30%    BP Readings from Last 3 Encounters:  07/08/15 156/70  06/26/15 104/58  06/20/15 124/60     Patient Active Problem List   Diagnosis Date Noted  . Gout 06/26/2015  . AKI (acute kidney injury) ([place])   . Left arm pain 05/08/2015  . Left hand pain 05/08/2015  . Chronic systolic CHF (congestive heart failure) ([place]) 04/08/2015  . Diverticulitis 04/08/2015  . Bilateral edema of lower extremity 03/29/2015  . DOE (dyspnea on exertion), due to significant CAD 03/28/2015  . CKD (chronic kidney disease) stage 4, GFR 15-29 ml/min (HCC) 03/04/2015  . Unstable angina  ([place]) 10/16/2014  . Acute renal failure superimposed on stage 4 chronic kidney disease ([place]) 10/16/2014  . Dyspnea on exertion 09/13/2014  . CAD (coronary artery disease), native coronary artery, in all vessels, significant 09/13/2014  . GERD (gastroesophageal reflux disease) 08/07/2012  . Renal insufficiency 08/07/2012  . Depression 08/05/2012  . Sleep apnea 08/05/2012  . Diabetes type 2, uncontrolled ([place]) 08/05/2012  . HTN (hypertension) 08/05/2012  . Ischemic cardiomyopathy 11/03/2011  . Atrioventricular block, complete ([place])   . Obesity   . Pacemaker     Past Medical History  Diagnosis Date  . COPD (chronic obstructive pulmonary disease) (HCC)     Severe  . Hypercholesterolemia   . Depression   . Coronary artery disease     a. s/p MI in 1994/1995 while in [place] s/p questionable PCI. 03/2015: progression of disease, for staged PCI.  [name] Hypertension   . Atrioventricular block, complete ([place])     a. 2010 s/p pacemaker.  . Pacemaker     medtronic  . Obesity   . TIA (transient ischemic attack) X 3  . Neuropathy (HCC)     IN LOWER EXTREMITIES  . GERD (gastroesophageal reflux disease)   . DOE (dyspnea on exertion) 03/28/2015  . CKD (chronic kidney disease), stage IV ([place])   .  Type II diabetes mellitus ([place])   . Diabetic peripheral neuropathy ([place])   . Chronic systolic CHF (congestive heart failure) ([place])     a. LVEF 35-40% by echo 09/2014.  . MI (myocardial infarction) ([place]) 1994; 1995  . Anginal pain ([place])   . Sleep apnea     "sleeps w/humidifyer when he panics and gets short of breath" (04/08/2015)  . Anxiety   . Arm pain 05/08/2015    LEFT ARM    Past Surgical History  Procedure Laterality Date  . Tonsillectomy    . Cholecystectomy open  ~ 1977  . Insert / replace / remove pacemaker  07/2008    Complete heart block status post DDD with good function  . Hiatal hernia repair  1977  . Cardiac catheterization N/A 03/29/2015    Procedure: Right/Left Heart Cath and  Coronary Angiography;  Surgeon: Adisa M [name], MD;  Location: [facility];  Service: Cardiovascular;  Laterality: N/A;  . Cardiac catheterization  1995    "after my MI; put me on heart RX after cath"  . Cardiac catheterization N/A 04/09/2015    Procedure: Coronary Stent Intervention;  Surgeon: Matthe M [name], MD;  Location: [facility];  Service: Cardiovascular;  Laterality: N/A;    Social History  Substance Use Topics  . Smoking status: Former Smoker -- 1.50 packs/day for 54 years    Types: Cigarettes    Quit date: 07/18/2007  . Smokeless tobacco: Never Used  . Alcohol Use: Yes     Comment: 04/08/2015 "probably 3 drinks/month"    Family History  Problem Relation Age of Onset  . Stroke Mother   . Leukemia Father   . Stroke Sister   . Heart attack Brother     Allergies  Allergen Reactions  . Bee Venom Anaphylaxis  . Prednisone Other (See Comments)    hallucinations  . Zocor [Simvastatin] Nausea Only and Other (See Comments)    Headache with brand name only.  Can take the generic.    Medication list has been reviewed and updated.  Current Outpatient Prescriptions on File Prior to Visit  Medication Sig Dispense Refill  . aspirin EC 81 MG tablet Take 81 mg by mouth daily.    [name] atorvastatin (LIPITOR) 40 MG tablet Take 1 tablet (40 mg total) by mouth daily. 90 tablet 3  . carvedilol (COREG) 12.5 MG tablet Take 1 tablet (12.5 mg total) by mouth 2 (two) times daily with a meal. 60 tablet 6  . clopidogrel (PLAVIX) 75 MG tablet Take 1 tablet (75 mg total) by mouth daily with breakfast. 30 tablet 6  . colchicine 0.6 MG tablet Take 0.5 tablets (0.3 mg total) by mouth 3 (three) times daily. 90 tablet 3  . furosemide (LASIX) 40 MG tablet Take 1 tablet (40 mg total) by mouth daily. 90 tablet 3  . hydrALAZINE (APRESOLINE) 25 MG tablet Take 1.5 tablets (37.5 mg total) by mouth 2 (two) times daily. 90 tablet 11  . Insulin Pen Needle (B-D UF III MINI PEN NEEDLES) 31G X 5 MM  MISC Use to test blood sugar 3 times daily. Dx code: 250.02 100 each 11  . isosorbide mononitrate (IMDUR) 30 MG 24 hr tablet Take 1 tablet (30 mg total) by mouth daily. 30 tablet 6  . LANTUS SOLOSTAR 100 UNIT/ML Solostar Pen INJECT 60 UNITS UNDER THE SKIN ONCE DAILY 15 mL 1  . nitroGLYCERIN (NITROSTAT) 0.4 MG SL tablet Place 1 tablet (0.4 mg total) under the tongue every  5 (five) minutes x 3 doses as needed for chest pain. 25 tablet 3  . OVER THE COUNTER MEDICATION Place 1 drop into both eyes 3 (three) times daily. OTC Bausch-Lomb eye bath    . pantoprazole (PROTONIX) 40 MG tablet Take 1 tablet (40 mg total) by mouth daily. 30 tablet 6  . sertraline (ZOLOFT) 100 MG tablet Take 1.5 tablets (150 mg total) by mouth daily. 135 tablet 1  . vitamin B-12 (CYANOCOBALAMIN) 1000 MCG tablet Take 1,000 mcg by mouth daily.     No current facility-administered medications on file prior to visit.    Review of Systems:  As per HPI- otherwise negative.   Physical Examination: Filed Vitals:   07/08/15 1437  BP: 156/70  Pulse: 73  Temp: 97.9 F (36.6 C)  Resp: 18   Filed Vitals:   07/08/15 1437  Height: 5\' 8"  (1.727 m)  Weight: 226 lb 9.6 oz (102.785 kg)   Body mass index is 34.46 kg/(m^2). Ideal Body Weight: Weight in (lb) to have BMI = 25: 164.1  GEN: WDWN, NAD, Non-toxic, A & O x 3, looks well, obese.  Here today whit his wife HEENT: Atraumatic, Normocephalic. Neck supple. No masses, No LAD. Ears and Nose: No external deformity. CV: RRR, No M/G/R. No JVD. No thrill. No extra heart sounds. PULM: CTA B, no wheezes, crackles, rhonchi. No retractions. No resp. distress. No accessory muscle use. ABD: S, NT, ND EXTR: No c/c.  1-2+ pitting edema in both legs to the mid tibia NEURO Normal gait.  PSYCH: Normally interactive. Conversant. Not depressed or anxious appearing.  Calm demeanor.   Wt Readings from Last 3 Encounters:  07/08/15 226 lb 9.6 oz (102.785 kg)  06/26/15 221 lb (100.245 kg)   06/20/15 221 lb 12.5 oz (100.6 kg)    Assessment and Plan: Essential hypertension  Edema of lower extremity, unspecified laterality - Plan: Basic metabolic panel, furosemide (LASIX) 20 MG tablet  SOB (shortness of breath)  Systolic congestive heart failure, unspecified congestive heart failure chronicity ([place]) - Plan: Brain natriuretic peptide, furosemide (LASIX) 20 MG tablet  Here today with concern of increased home BP readings, LE edema and weight gain.  He is not in any distress but suspect he is in some degree of fluid overload.  However he also recently suffered from orthostatic hypotension.  Will increase his lasix slightly to 30 mg and check BMP/ BNP.  Will follow-up with his primary cardiologist when labs come back  Signed Lamar Blinks, MD

## 2015-07-08 NOTE — Patient Instructions (Signed)
I think that your lower leg edema is due to your heart failure and got worse because you needed to decrease your lasix I will check labs to make sure that your heart failure is not out of hand  For the time being add 10 mg of lasix to the 20 mg that you are currently taking for a total of 30 mg.  When I get your labs in I will check with Dr. [name] You will take 1/2 or the 40 mg lasix AND 1/2 of the 20 mg lasix for a total of 30 mg

## 2015-07-09 ENCOUNTER — Other Ambulatory Visit: Payer: Self-pay | Admitting: Nurse Practitioner

## 2015-07-09 LAB — BRAIN NATRIURETIC PEPTIDE: Brain Natriuretic Peptide: 143.5 pg/mL — ABNORMAL HIGH (ref 0.0–100.0)

## 2015-07-10 ENCOUNTER — Ambulatory Visit (HOSPITAL_COMMUNITY): Payer: 59

## 2015-07-10 ENCOUNTER — Encounter (HOSPITAL_COMMUNITY)
Admission: RE | Admit: 2015-07-10 | Discharge: 2015-07-10 | Disposition: A | Discharge status: Home / Self Care | Payer: 59 | Source: Ambulatory Visit | Attending: Cardiology | Admitting: Cardiology

## 2015-07-10 DIAGNOSIS — Z959 Presence of cardiac and vascular implant and graft, unspecified: Secondary | ICD-10-CM | POA: Diagnosis not present

## 2015-07-10 LAB — GLUCOSE, CAPILLARY: GLUCOSE-CAPILLARY: 216 mg/dL — AB (ref 65–99)

## 2015-07-10 NOTE — Telephone Encounter (Signed)
Patient calls back after hours at 104.  The swelling has gone down in both legs.  He is taking 30 mg of the lasix per day.  He states that he has not had much lightheadedness.  No headache.

## 2015-07-10 NOTE — Progress Notes (Signed)
Pt reported to rehab staff that he was seen by MD for elevated blood pressure readings at home.  Pt seen by Dr. Lorelei Pont, Dr. [name] was unavailable.  Pt reported unusually high readings unobserved here in rehab.  Pt with normal reading at MD office.  Pt with normal readings today.  Weight stable although pt complains of some swelling to left foot.  Encouraged pt to keep feet and legs elevated when sitting in chair at home.  Pt asked to please bring in home bp cuff to compare to manual reading for accuracy.  Pt verbalized understanding. Cherre Huger, BSN

## 2015-07-11 NOTE — Telephone Encounter (Signed)
Called pt back.

## 2015-07-12 ENCOUNTER — Encounter (HOSPITAL_COMMUNITY): Payer: 59

## 2015-07-12 ENCOUNTER — Ambulatory Visit (HOSPITAL_COMMUNITY): Payer: 59

## 2015-07-12 NOTE — Telephone Encounter (Signed)
Called and reached them on 12/30.  Spoke with his wife as he was still asleep.  She was able to access some recent datepoints that he had stored in their computer  12/26: 135/58, wt 229 12/27: 162/80  wt 223 12/28: 140/63, wt  222 12/29   155/90  wt 223  Wt Readings from Last 3 Encounters:  07/08/15 226 lb 9.6 oz (102.785 kg)  06/26/15 221 lb (100.245 kg)  06/20/15 221 lb 12.5 oz (100.6 kg)   It sounds as though he is doing well but no appt with Dr. [name] until mid feb.  Made an appt for him on Jan 5th with PA at [facility]. They will alert the pt

## 2015-07-15 ENCOUNTER — Ambulatory Visit (HOSPITAL_COMMUNITY): Payer: 59

## 2015-07-15 MED FILL — PANTOPRAZOLE SOD DR 40 MG T: 40 | 90 days supply | Qty: 90 | Fill #1

## 2015-07-17 ENCOUNTER — Encounter (HOSPITAL_COMMUNITY)
Admission: RE | Admit: 2015-07-17 | Discharge: 2015-07-17 | Disposition: A | Discharge status: Home / Self Care | Payer: 59 | Source: Ambulatory Visit | Attending: Cardiology | Admitting: Cardiology

## 2015-07-17 ENCOUNTER — Ambulatory Visit (HOSPITAL_COMMUNITY): Payer: 59

## 2015-07-17 DIAGNOSIS — Z959 Presence of cardiac and vascular implant and graft, unspecified: Secondary | ICD-10-CM | POA: Insufficient documentation

## 2015-07-17 LAB — GLUCOSE, CAPILLARY: Glucose-Capillary: 122 mg/dL — ABNORMAL HIGH (ref 65–99)

## 2015-07-17 NOTE — Progress Notes (Signed)
appt scheduled with counselor Trenton Gammon 07/31/2015 @ 10:00am.  Pt given written and verbal instructions for appt.  Understanding verbalized.  Pt is eager for this appt.

## 2015-07-17 NOTE — Progress Notes (Signed)
Pt arrived at cardiac rehab with home blood pressure cuff.  Manual BP:  124/60. Pt BP cuff:  153/62. This recording was taken with BP cuff inflation set to 200.  Decreased inflation to 170.  Recheck:  141/70.  Pt reports it has been a long time since he changed batteries in his cuff.  Pt instructed to insert new batteries and will recheck machine.  Pt also instructed on proper sitting position when checking home BP.  Pt verbalized understanding.

## 2015-07-18 ENCOUNTER — Encounter: Payer: Self-pay | Admitting: Physician Assistant

## 2015-07-18 ENCOUNTER — Ambulatory Visit (INDEPENDENT_AMBULATORY_CARE_PROVIDER_SITE_OTHER): Payer: 59 | Admitting: Physician Assistant

## 2015-07-18 VITALS — BP 138/72 | HR 88 | Ht 67.0 in | Wt 226.1 lb

## 2015-07-18 DIAGNOSIS — Z79899 Other long term (current) drug therapy: Secondary | ICD-10-CM

## 2015-07-18 DIAGNOSIS — I5042 Chronic combined systolic (congestive) and diastolic (congestive) heart failure: Secondary | ICD-10-CM | POA: Diagnosis not present

## 2015-07-18 DIAGNOSIS — I2510 Atherosclerotic heart disease of native coronary artery without angina pectoris: Secondary | ICD-10-CM

## 2015-07-18 LAB — BASIC METABOLIC PANEL
BUN: 47 mg/dL — AB (ref 7–25)
CHLORIDE: 107 mmol/L (ref 98–110)
CO2: 24 mmol/L (ref 20–31)
CREATININE: 2.06 mg/dL — AB (ref 0.70–1.11)
Calcium: 9 mg/dL (ref 8.6–10.3)
GLUCOSE: 104 mg/dL — AB (ref 65–99)
POTASSIUM: 4.3 mmol/L (ref 3.5–5.3)
Sodium: 138 mmol/L (ref 135–146)

## 2015-07-18 NOTE — Progress Notes (Signed)
Cardiology Office Note   Date:  07/18/2015   ID:  Rodney Farley, DOB October 04, 1931, MRN [id number]  PCP:  [name], MD  Cardiologist:  Dr [name]  [name], PA-C   Chief Complaint  Patient presents with  . Follow-up    no chest pain, no shortness of breath, has edema, occassional pain in legs, no cramping in legs, has lightheadedness and dizziness    History of Present Illness: Rodney Farley is a 82 y.o. male with a history of DM, CKD IV, S-CHF w/ EF 35% 09/2014, TIA, MDT PPM for CHB, HTN, HL, COPD, OSA, DES mid LAD, second diagonal, and third OM 04/09/2015, med rx for CFX 70%  Rodney Farley presents for evaluation.  He has been doing cardiac rehabilitation consistently. His blood pressures at home have been elevated, but when he took his cuff in to cardiac rehabilitation to check it, his cuff was not accurate. He is working on this. He is weighing himself daily and his weight is trending down gradually. He was congratulated on this.  When he had gout, he really struggled with it and was very ill. The gout has improved. He is still taking colchicine but has conflicting instructions on the bottle versus the printout. He was advised to talk to Dr. [name] about this.  He has not had labs checked in a couple of weeks. He has occasional pain in his legs, but it is joint related and it is improving. He is not having leg cramps. He is successfully increasing his activity at cardiac rehabilitation and generally doing well with this. He had some shortness of breath and back pain the other day. He has chronic back issues but was told that his posture was poor and his symptoms improved when he used a walker which forced him to stand upright. He is going to work with someone on this. He has not had chest pain.   Past Medical History  Diagnosis Date  . COPD (chronic obstructive pulmonary disease) (HCC)     Severe  . Hypercholesterolemia   . Depression   . Coronary  artery disease     a. s/p MI in 1994/1995 while in [place] s/p questionable PCI. 03/2015: progression of disease, for staged PCI.  [name] Hypertension   . Atrioventricular block, complete ([place])     a. 2010 s/p pacemaker.  . Pacemaker     medtronic  . Obesity   . TIA (transient ischemic attack) X 3  . Neuropathy (HCC)     IN LOWER EXTREMITIES  . GERD (gastroesophageal reflux disease)   . DOE (dyspnea on exertion) 03/28/2015  . CKD (chronic kidney disease), stage IV ([place])   . Type II diabetes mellitus ([place])   . Diabetic peripheral neuropathy ([place])   . Chronic systolic CHF (congestive heart failure) ([place])     a. LVEF 35-40% by echo 09/2014.  . MI (myocardial infarction) ([place]) 1994; 1995  . Anginal pain ([place])   . Sleep apnea     "sleeps w/humidifyer when he panics and gets short of breath" (04/08/2015)  . Anxiety   . Arm pain 05/08/2015    LEFT ARM    Past Surgical History  Procedure Laterality Date  . Tonsillectomy    . Cholecystectomy open  ~ 1977  . Insert / replace / remove pacemaker  07/2008    Complete heart block status post DDD with good function  . Hiatal hernia repair  1977  . Cardiac catheterization N/A 03/29/2015  Procedure: Right/Left Heart Cath and Coronary Angiography;  Surgeon: [name], MD;  Location: [facility];  Service: Cardiovascular;  Laterality: N/A;  . Cardiac catheterization  1995    "after my MI; put me on heart RX after cath"  . Cardiac catheterization N/A 04/09/2015    Procedure: Coronary Stent Intervention;  Surgeon: [name], MD;  Location: [facility];  Service: Cardiovascular;  Laterality: N/A;    Current Outpatient Prescriptions  Medication Sig Dispense Refill  . aspirin EC 81 MG tablet Take 81 mg by mouth daily.    [name] atorvastatin (LIPITOR) 40 MG tablet Take 1 tablet (40 mg total) by mouth daily. 90 tablet 3  . carvedilol (COREG) 12.5 MG tablet Take 1 tablet (12.5 mg total) by mouth 2 (two) times daily with a meal. 60 tablet  11  . clopidogrel (PLAVIX) 75 MG tablet Take 1 tablet (75 mg total) by mouth daily with breakfast. 30 tablet 6  . colchicine 0.6 MG tablet Take 0.5 tablets (0.3 mg total) by mouth 3 (three) times daily. 90 tablet 3  . furosemide (LASIX) 20 MG tablet Take a 1/2 tablet daily to equal a total of 30 mg or as directed 30 tablet 1  . furosemide (LASIX) 40 MG tablet Take 1 tablet (40 mg total) by mouth daily. 90 tablet 3  . hydrALAZINE (APRESOLINE) 25 MG tablet Take 1.5 tablets (37.5 mg total) by mouth 2 (two) times daily. 90 tablet 11  . Insulin Pen Needle (B-D UF III MINI PEN NEEDLES) 31G X 5 MM MISC Use to test blood sugar 3 times daily. Dx code: 250.02 100 each 11  . isosorbide mononitrate (IMDUR) 30 MG 24 hr tablet Take 1 tablet (30 mg total) by mouth daily. 30 tablet 6  . LANTUS SOLOSTAR 100 UNIT/ML Solostar Pen INJECT 60 UNITS UNDER THE SKIN ONCE DAILY 15 mL 1  . nitroGLYCERIN (NITROSTAT) 0.4 MG SL tablet Place 1 tablet (0.4 mg total) under the tongue every 5 (five) minutes x 3 doses as needed for chest pain. 25 tablet 3  . OVER THE COUNTER MEDICATION Place 1 drop into both eyes 3 (three) times daily. OTC [place] eye bath    . pantoprazole (PROTONIX) 40 MG tablet Take 1 tablet (40 mg total) by mouth daily. 30 tablet 6  . sertraline (ZOLOFT) 100 MG tablet Take 1.5 tablets (150 mg total) by mouth daily. 135 tablet 1  . vitamin B-12 (CYANOCOBALAMIN) 1000 MCG tablet Take 1,000 mcg by mouth daily.     No current facility-administered medications for this visit.    Allergies:   Bee venom; Prednisone; and Zocor    Social History:  The patient  reports that he quit smoking about 8 years ago. His smoking use included Cigarettes. He has a 81 pack-year smoking history. He has never used smokeless tobacco. He reports that he drinks alcohol. He reports that he does not use illicit drugs.   Family History:  [name] family history includes Heart attack in his brother; Leukemia in his father; Stroke  in his mother and sister.    ROS:  Please see the history of present illness. All other systems are reviewed and negative.    PHYSICAL EXAM: VS:  BP 138/72 mmHg  Pulse 88  Ht 5\' 7"  (1.702 m)  Wt 226 lb 1 oz (102.541 kg)  BMI 35.40 kg/m2 , BMI Body mass index is 35.4 kg/(m^2). GEN: Well nourished, well developed, male in no acute distress HEENT: normal  for age  Neck: no JVD, minimal hepatojugular reflux, no carotid bruit, no masses Cardiac: RRR; no murmur, no rubs, or gallops Respiratory:  clear to auscultation bilaterally, normal work of breathing GI: soft, nontender, nondistended, + BS MS: no deformity or atrophy; trace pedal edema; distal pulses are 2+ in all 4 extremities  Skin: warm and dry, no rash Neuro:  Strength and sensation are intact Psych: euthymic mood, full affect   EKG:  EKG is not ordered today.   Recent Labs: 03/28/2015: Magnesium 1.6*; TSH 1.606 06/26/2015: ALT 16; Hemoglobin 11.6*; Platelets 212 07/08/2015: BUN 48*; Creat 1.78*; Potassium 5.2; Sodium 140    Lipid Panel    Component Value Date/Time   CHOL 171 06/26/2015 1103   TRIG 317* 06/26/2015 1103   HDL 43 06/26/2015 1103   CHOLHDL 4.0 06/26/2015 1103   VLDL 63* 06/26/2015 1103   LDLCALC 65 06/26/2015 1103     Wt Readings from Last 3 Encounters:  07/18/15 226 lb 1 oz (102.541 kg)  07/08/15 226 lb 9.6 oz (102.785 kg)  06/26/15 221 lb (100.245 kg)     Other studies Reviewed: Additional studies/ records that were reviewed today include: Office Notes, rehabilitation records and other data.  ASSESSMENT AND PLAN:  1.  CAD: He is having no acute ischemic symptoms he is on good medical therapy with aspirin, beta blocker, Plavix, Imdur, and statin. Continue cardiac rehabilitation.  2. Chronic combined diastolic and systolic CHF: His weight is trending down. He is compliant with his Lasix and feels that he is doing well with a low sodium diet. He is on good therapy with Lasix and the above  medications plus hydralazine. Recent echo showed an EF of 25-30 percent with grade 1 diastolic dysfunction. Continue current therapy. He will get a BMET today as it has been a while since he had one and follow-up with Dr. [name].  3. Gout: He has conflicting instructions on his colchicine. He is encouraged to call Dr. [name] about this and states he will do so.   Current medicines are reviewed at length with the patient today.  The patient has concerns regarding medicines. These concerns are regarding his colchicine and he is encouraged to contact primary care.  The following changes have been made:  no change  Labs/ tests ordered today include:   Orders Placed This Encounter  Procedures  . Basic metabolic panel     Disposition:   FU with Dr. [name]  Signed, Lenoard Aden  07/18/2015 11:13 AM    [facility] [address], [place], [place]  [address] Phone: [phone]; Fax: [phone]

## 2015-07-18 NOTE — Patient Instructions (Signed)
Your physician recommends that you return for lab work [id number]  Your physician recommends that you schedule a follow-up appointment in: 3 months with Dr [name]

## 2015-07-19 ENCOUNTER — Ambulatory Visit (HOSPITAL_COMMUNITY): Payer: 59

## 2015-07-19 ENCOUNTER — Encounter (HOSPITAL_COMMUNITY)
Admission: RE | Admit: 2015-07-19 | Discharge: 2015-07-19 | Disposition: A | Discharge status: Home / Self Care | Payer: 59 | Source: Ambulatory Visit | Attending: Cardiology | Admitting: Cardiology

## 2015-07-19 DIAGNOSIS — Z959 Presence of cardiac and vascular implant and graft, unspecified: Secondary | ICD-10-CM | POA: Diagnosis not present

## 2015-07-22 ENCOUNTER — Ambulatory Visit (HOSPITAL_COMMUNITY): Payer: 59

## 2015-07-22 ENCOUNTER — Encounter (HOSPITAL_COMMUNITY): Payer: 59

## 2015-07-23 MED FILL — LANTUS SOLOSTAR 100 UNITS/M: 100 | 25 days supply | Qty: 15 | Fill #1

## 2015-07-24 ENCOUNTER — Ambulatory Visit: Payer: 59 | Admitting: Internal Medicine

## 2015-07-24 ENCOUNTER — Telehealth: Payer: Self-pay

## 2015-07-24 ENCOUNTER — Encounter (HOSPITAL_COMMUNITY)
Admission: RE | Admit: 2015-07-24 | Discharge: 2015-07-24 | Disposition: A | Discharge status: Home / Self Care | Payer: 59 | Source: Ambulatory Visit | Attending: Cardiology | Admitting: Cardiology

## 2015-07-24 ENCOUNTER — Ambulatory Visit (HOSPITAL_COMMUNITY): Payer: 59

## 2015-07-24 DIAGNOSIS — Z959 Presence of cardiac and vascular implant and graft, unspecified: Secondary | ICD-10-CM | POA: Diagnosis not present

## 2015-07-24 LAB — GLUCOSE, CAPILLARY: Glucose-Capillary: 177 mg/dL — ABNORMAL HIGH (ref 65–99)

## 2015-07-24 NOTE — Telephone Encounter (Signed)
Returned call to patient no answer.LMTC. 

## 2015-07-24 NOTE — Progress Notes (Signed)
Pt arrived at cardiac rehab today c/o headache, staggering gait walking into cardiac rehab and generalized malaise.  Pt reports he had difficulty sleeping last night which resulted in fatigue this morning.  Headache relieved with sitting.  BP:  152/82. T-98.2, O2sat-98% ra, EKG- 63 av paced. 1+ Bilateral lower extremity edema to mid calf.  Equal grip strength and bilateral lower extremity strength. Weight up 1.8 kg in 1 week.   Pt denies chest pain,dyspea or orthopnea. Pt able to ambulate track in cardiac rehab without symptoms, however jittery upon standing.  PC to Dr. [name] office to advise of pt symptoms.  Pt wife informed of pt symptoms.  Pt instructed to followup today with Dr. [name] for further evaluation of symptoms. Pt verbalized understanding.  Cardiology made aware of pt weight gain.

## 2015-07-24 NOTE — Telephone Encounter (Signed)
I would have him take an extra 20 mg lasix (total 60 mg) daily for 3 days then return to 40 mg daily.  Danel [name] MD, [facility]

## 2015-07-24 NOTE — Telephone Encounter (Signed)
Received a call from [facility] with Cardiac Rehab.She was calling to report patient has gained 4 lbs since last week.Stated he has 1+ edema in both lower legs.No sob.Stated he was not feeling good this morning c/o headache.Stated he did not exercise.B/P 152/82 pulse 67.Message sent to [place] for advice.

## 2015-07-25 ENCOUNTER — Other Ambulatory Visit: Payer: Self-pay | Admitting: Internal Medicine

## 2015-07-25 NOTE — Telephone Encounter (Signed)
Returned call to patient no answer.LMTC. 

## 2015-07-26 ENCOUNTER — Encounter (HOSPITAL_COMMUNITY)
Admission: RE | Admit: 2015-07-26 | Discharge: 2015-07-26 | Disposition: A | Discharge status: Home / Self Care | Payer: 59 | Source: Ambulatory Visit | Attending: Cardiology | Admitting: Cardiology

## 2015-07-26 ENCOUNTER — Ambulatory Visit (HOSPITAL_COMMUNITY): Payer: 59

## 2015-07-26 DIAGNOSIS — Z959 Presence of cardiac and vascular implant and graft, unspecified: Secondary | ICD-10-CM | POA: Diagnosis not present

## 2015-07-26 LAB — GLUCOSE, CAPILLARY: Glucose-Capillary: 126 mg/dL — ABNORMAL HIGH (ref 65–99)

## 2015-07-29 ENCOUNTER — Ambulatory Visit (HOSPITAL_COMMUNITY): Payer: 59

## 2015-07-29 ENCOUNTER — Encounter (HOSPITAL_COMMUNITY)
Admission: RE | Admit: 2015-07-29 | Discharge: 2015-07-29 | Disposition: A | Discharge status: Home / Self Care | Payer: 59 | Source: Ambulatory Visit | Attending: Cardiology | Admitting: Cardiology

## 2015-07-29 DIAGNOSIS — Z959 Presence of cardiac and vascular implant and graft, unspecified: Secondary | ICD-10-CM | POA: Diagnosis not present

## 2015-07-29 LAB — GLUCOSE, CAPILLARY
GLUCOSE-CAPILLARY: 102 mg/dL — AB (ref 65–99)
Glucose-Capillary: 94 mg/dL (ref 65–99)

## 2015-07-29 MED FILL — SERTRALINE HCL 100 MG TAB: 100 | 90 days supply | Qty: 135 | Fill #1

## 2015-07-29 NOTE — Progress Notes (Signed)
  30 day Psychosocial followup assessment  Patient psychosocial assessment reveals no barriers to cardiac rehab participation.  Psychosocial areas that are affecting patient's rehab experience include concerns about his health  and emotional issues as evidenced by stress and anxiety.    Patient does continue to exhibit positive coping skills to deal with psychosocial concerns.  Patient does feel making progress towards cardiac rehab goals.  Patient reports health and activity level no change in the past 30 days as evidenced by patient's reports of continued fatigue and back pain.  Pt has not followed up with PCP as directed. Pt missed scheduled appt due to inclement weather.    Patient wife states she has seen subtle change in patient mood and behavior.  Pt often seems fatigue with early morning exercise class however pt declined offer to come to later class time. Pt does ask appropriate questions at cardiac rehab about his health and is reassured.  Pt feels well to day and is looking forward to a busy day running errands.   Patient reports feeling positive about current and projected progress toward cardiac rehab goals.  Patient's rate of progress towards goals is good.  Plan of action to help patient continue to work towards rehab goals include continued participation in cardiac rehab and encouragement to participate in home exercise.  Will continue to monitor and evaluate progress toward psychosocial goals. Pt has upcoming counselor appt 07/21/15.   Goals in progress:  improved management of stress and anxiety.   Help pt work towards meaningful activities to increase quality of life.  Andi Hence, RN, BSN Cardiac Pulmonary Rehab

## 2015-07-29 NOTE — Progress Notes (Signed)
Rodney Farley 82 y.o. male Nutrition Note Spoke with pt. Nutrition Plan and Nutrition Survey goals reviewed with pt. Pt is following Step 1  of the Therapeutic Lifestyle Changes diet. Age-appropriate nutrition recommendations discussed. Pt is diabetic. Last A1c indicates blood glucose well-controlled. This Probation officer went over Diabetes Education test results. Pt checks CBG's 2 times a day. Fasting CBG's reportedly 60-84 mg/dL, "which is low for me." Pt concerned about hypoglycemia. Hypoglycemia and better CBG control with exercise discussed. Pt wants to lose wt. Wt loss tips briefly reviewed. Pt with dx of CHF. Per discussion, pt does not use canned/convenience foods often. Pt rarely adds salt to food. Pt expressed understanding of the information reviewed. Pt aware of nutrition education classes offered and plans on attending nutrition classes.  Lab Results  Component Value Date   HGBA1C 6.9 06/26/2015   Nutrition Diagnosis ? Food-and nutrition-related knowledge deficit related to lack of exposure to information as related to diagnosis of: ? CVD ? DM ? Obesity related to excessive energy intake as evidenced by a BMI of 34.2  Nutrition RX/ Estimated Daily Nutrition Needs for: wt loss 1500-2000 Kcal, 40-55 gm fat, 10-13 gm sat fat, 1.5-2.0 gm trans-fat, <1500 mg sodium, 175-250 gm CHO   Nutrition Intervention ? Pt's individual nutrition plan reviewed with pt. ? Benefits of adopting Therapeutic Lifestyle Changes discussed when Medficts reviewed. ? Pt to attend the Portion Distortion class - met; 07/17/15 ? Pt to attend the Diabetes Q & A class - met; 07/26/15 ? Pt to attend the   ? Nutrition I class                  ? Nutrition II class     ? Diabetes Blitz class ? Handout for 5-day, 1500 kcal menu ideas ? Continue client-centered nutrition education by RD, as part of interdisciplinary care. Goal(s) ? Pt to identify and limit food sources of saturated fat, trans fat, and cholesterol ? Pt to  identify food quantities necessary to achieve: ? wt loss to a goal wt of 6-24 lb (2.7-10.9 kg) at graduation from cardiac rehab.  ? Use pre-meal and post-meal CBG's and A1c to determine whether adjustments in food/meal planning will be beneficial or if any meds need to be combined with nutrition therapy. Monitor and Evaluate progress toward nutrition goal with team. Nutrition Risk: Change to Moderate Rodney Farley, M.Ed, RD, LDN, CDE 07/29/2015 9:23 AM

## 2015-07-30 NOTE — Telephone Encounter (Signed)
Returned call to patient Dr.Jordan advised to take a extra 20 mg lasix daily for the next 3 days then return to normal dose.Advised to call back and let me know if this helps edema.

## 2015-07-31 ENCOUNTER — Ambulatory Visit (HOSPITAL_COMMUNITY): Payer: 59

## 2015-07-31 ENCOUNTER — Encounter (HOSPITAL_COMMUNITY)
Admission: RE | Admit: 2015-07-31 | Discharge: 2015-07-31 | Disposition: A | Discharge status: Home / Self Care | Payer: 59 | Source: Ambulatory Visit | Attending: Cardiology | Admitting: Cardiology

## 2015-07-31 DIAGNOSIS — Z959 Presence of cardiac and vascular implant and graft, unspecified: Secondary | ICD-10-CM | POA: Diagnosis not present

## 2015-08-02 ENCOUNTER — Telehealth (HOSPITAL_COMMUNITY): Payer: Self-pay | Admitting: Internal Medicine

## 2015-08-02 ENCOUNTER — Ambulatory Visit (HOSPITAL_COMMUNITY): Payer: 59

## 2015-08-02 ENCOUNTER — Encounter (HOSPITAL_COMMUNITY): Payer: 59

## 2015-08-03 ENCOUNTER — Encounter: Payer: Self-pay | Admitting: Cardiology

## 2015-08-05 ENCOUNTER — Encounter (HOSPITAL_COMMUNITY)
Admission: RE | Admit: 2015-08-05 | Discharge: 2015-08-05 | Disposition: A | Discharge status: Home / Self Care | Payer: 59 | Source: Ambulatory Visit | Attending: Cardiology | Admitting: Cardiology

## 2015-08-05 ENCOUNTER — Ambulatory Visit (HOSPITAL_COMMUNITY): Payer: 59

## 2015-08-05 DIAGNOSIS — Z959 Presence of cardiac and vascular implant and graft, unspecified: Secondary | ICD-10-CM | POA: Diagnosis not present

## 2015-08-05 LAB — GLUCOSE, CAPILLARY
Glucose-Capillary: 181 mg/dL — ABNORMAL HIGH (ref 65–99)
Glucose-Capillary: 148 mg/dL — ABNORMAL HIGH (ref 65–99)

## 2015-08-05 MED ORDER — FUROSEMIDE 40 MG PO TABS: ORAL_TABLET | ORAL | Status: DC

## 2015-08-05 MED FILL — FUROSEMIDE 40 MG TABLET: 40 | 30 days supply | Qty: 60 | Fill #0

## 2015-08-05 NOTE — Progress Notes (Signed)
Pt with car issues and will need to use his wife's car. Pt wife works night shift and would not return home in time for pt to arrive to the 8:15am class on time.  Pt will switch to 945 am class. Cherre Huger, BSN

## 2015-08-05 NOTE — Telephone Encounter (Signed)
Returned call to patient Dr.Jordan received email swelling no better.Dr.Jordan advised take lasix 40 mg twice a day.Advised to call back in 3 days if no better.

## 2015-08-05 NOTE — Addendum Note (Signed)
Addended by: Golden Hurter D on: 08/05/2015 10:38 AM   Modules accepted: Orders, Medications

## 2015-08-06 ENCOUNTER — Encounter: Payer: 59 | Admitting: *Deleted

## 2015-08-06 ENCOUNTER — Telehealth: Payer: Self-pay | Admitting: Cardiology

## 2015-08-06 NOTE — Telephone Encounter (Signed)
LMOVM reminding pt to send remote transmission.   

## 2015-08-07 ENCOUNTER — Encounter (HOSPITAL_COMMUNITY)
Admission: RE | Admit: 2015-08-07 | Discharge: 2015-08-07 | Disposition: A | Discharge status: Home / Self Care | Payer: 59 | Source: Ambulatory Visit | Attending: Cardiology | Admitting: Cardiology

## 2015-08-07 ENCOUNTER — Encounter (HOSPITAL_COMMUNITY): Payer: 59

## 2015-08-07 ENCOUNTER — Ambulatory Visit (HOSPITAL_COMMUNITY): Payer: 59

## 2015-08-07 ENCOUNTER — Encounter: Payer: Self-pay | Admitting: Cardiology

## 2015-08-07 DIAGNOSIS — Z959 Presence of cardiac and vascular implant and graft, unspecified: Secondary | ICD-10-CM | POA: Diagnosis not present

## 2015-08-07 MED FILL — FUROSEMIDE 20 MG TABLET: 20 | 30 days supply | Qty: 45 | Fill #1

## 2015-08-07 NOTE — Progress Notes (Signed)
Patient reported that he had some diarrhea this morning and will not exercise today

## 2015-08-09 ENCOUNTER — Encounter (HOSPITAL_COMMUNITY): Payer: 59

## 2015-08-09 ENCOUNTER — Telehealth: Payer: Self-pay | Admitting: Cardiology

## 2015-08-09 ENCOUNTER — Ambulatory Visit (HOSPITAL_COMMUNITY): Payer: 59

## 2015-08-09 NOTE — Telephone Encounter (Signed)
Pt advised OK to resume previous dose and to call if he has issues w/ swelling return. Pt amenable to this plan and voiced understanding.

## 2015-08-09 NOTE — Telephone Encounter (Signed)
New message     Pt c/o medication issue:  1. Name of Medication: furosemide 2. How are you currently taking this medication (dosage and times per day)?  3. Are you having a reaction (difficulty breathing--STAT)? no  4. What is your medication issue? Pt has been taking double medication for 3 days.  He is down to 219lbs and the swelling in his feet is gone.  Should he continue to double the medication or go back to regular dosage?

## 2015-08-12 ENCOUNTER — Encounter (HOSPITAL_COMMUNITY): Payer: 59

## 2015-08-12 ENCOUNTER — Ambulatory Visit (HOSPITAL_COMMUNITY): Payer: 59

## 2015-08-12 ENCOUNTER — Encounter (HOSPITAL_COMMUNITY)
Admission: RE | Admit: 2015-08-12 | Discharge: 2015-08-12 | Disposition: A | Discharge status: Home / Self Care | Payer: 59 | Source: Ambulatory Visit | Attending: Cardiology | Admitting: Cardiology

## 2015-08-12 DIAGNOSIS — Z959 Presence of cardiac and vascular implant and graft, unspecified: Secondary | ICD-10-CM | POA: Diagnosis not present

## 2015-08-12 LAB — GLUCOSE, CAPILLARY
GLUCOSE-CAPILLARY: 161 mg/dL — AB (ref 65–99)
GLUCOSE-CAPILLARY: 140 mg/dL — AB (ref 65–99)

## 2015-08-12 MED FILL — TRUE METRIX GLUCOSE TEST ST: 30 days supply | Qty: 100 | Fill #3

## 2015-08-12 MED FILL — hydrALAZINE HCL 25 MG TABS: 25 | 30 days supply | Qty: 90 | Fill #3

## 2015-08-14 ENCOUNTER — Encounter (HOSPITAL_COMMUNITY): Payer: 59

## 2015-08-14 ENCOUNTER — Ambulatory Visit (HOSPITAL_COMMUNITY): Payer: 59

## 2015-08-14 ENCOUNTER — Encounter (HOSPITAL_COMMUNITY): Admission: RE | Admit: 2015-08-14 | Payer: 59 | Source: Ambulatory Visit | Attending: Cardiology | Admitting: Cardiology

## 2015-08-14 ENCOUNTER — Telehealth (HOSPITAL_COMMUNITY): Payer: Self-pay | Admitting: *Deleted

## 2015-08-16 ENCOUNTER — Encounter (HOSPITAL_COMMUNITY): Payer: 59

## 2015-08-16 ENCOUNTER — Ambulatory Visit (HOSPITAL_COMMUNITY): Payer: 59

## 2015-08-19 ENCOUNTER — Encounter (HOSPITAL_COMMUNITY): Payer: 59

## 2015-08-19 ENCOUNTER — Telehealth (HOSPITAL_COMMUNITY): Payer: Self-pay | Admitting: Internal Medicine

## 2015-08-19 ENCOUNTER — Ambulatory Visit (HOSPITAL_COMMUNITY): Payer: 59

## 2015-08-19 MED FILL — LANTUS SOLOSTAR 100 UNITS/M: 100 | 75 days supply | Qty: 45 | Fill #0

## 2015-08-21 ENCOUNTER — Encounter (HOSPITAL_COMMUNITY): Payer: 59

## 2015-08-21 ENCOUNTER — Ambulatory Visit (HOSPITAL_COMMUNITY): Payer: 59

## 2015-08-21 ENCOUNTER — Encounter (HOSPITAL_COMMUNITY)
Admission: RE | Admit: 2015-08-21 | Discharge: 2015-08-21 | Disposition: A | Discharge status: Home / Self Care | Payer: 59 | Source: Ambulatory Visit | Attending: Cardiology | Admitting: Cardiology

## 2015-08-21 DIAGNOSIS — Z959 Presence of cardiac and vascular implant and graft, unspecified: Secondary | ICD-10-CM | POA: Insufficient documentation

## 2015-08-21 LAB — HM DIABETES EYE EXAM

## 2015-08-21 LAB — GLUCOSE, CAPILLARY
GLUCOSE-CAPILLARY: 123 mg/dL — AB (ref 65–99)
Glucose-Capillary: 154 mg/dL — ABNORMAL HIGH (ref 65–99)

## 2015-08-23 ENCOUNTER — Encounter (HOSPITAL_COMMUNITY): Payer: 59

## 2015-08-23 ENCOUNTER — Encounter (HOSPITAL_COMMUNITY)
Admission: RE | Admit: 2015-08-23 | Discharge: 2015-08-23 | Disposition: A | Discharge status: Home / Self Care | Payer: 59 | Source: Ambulatory Visit | Attending: Cardiology | Admitting: Cardiology

## 2015-08-23 ENCOUNTER — Ambulatory Visit (HOSPITAL_COMMUNITY): Payer: 59

## 2015-08-23 DIAGNOSIS — Z959 Presence of cardiac and vascular implant and graft, unspecified: Secondary | ICD-10-CM | POA: Diagnosis not present

## 2015-08-23 LAB — GLUCOSE, CAPILLARY: Glucose-Capillary: 104 mg/dL — ABNORMAL HIGH (ref 65–99)

## 2015-08-23 MED FILL — BACITRACIN 500 UNIT/GM OPHT: 500 | 7 days supply | Qty: 4 | Fill #0

## 2015-08-23 MED FILL — ISOSORBIDE MN ER 30 MG TAB: 30 | 30 days supply | Qty: 30 | Fill #4

## 2015-08-23 NOTE — Progress Notes (Signed)
CBG 104 this morning. Rodney Farley ate yogurt and a protein drink but did not eat any toast as he usually does. Patient given a banana. [name] will not exercise due to CBG being less than 110. [name] plans to return to exercise on Monday.

## 2015-08-26 ENCOUNTER — Encounter (HOSPITAL_COMMUNITY): Payer: 59

## 2015-08-26 ENCOUNTER — Ambulatory Visit (HOSPITAL_COMMUNITY): Payer: 59

## 2015-08-26 ENCOUNTER — Encounter (HOSPITAL_COMMUNITY)
Admission: RE | Admit: 2015-08-26 | Discharge: 2015-08-26 | Disposition: A | Discharge status: Home / Self Care | Payer: 59 | Source: Ambulatory Visit | Attending: Cardiology | Admitting: Cardiology

## 2015-08-26 DIAGNOSIS — Z959 Presence of cardiac and vascular implant and graft, unspecified: Secondary | ICD-10-CM | POA: Diagnosis not present

## 2015-08-26 LAB — GLUCOSE, CAPILLARY
GLUCOSE-CAPILLARY: 99 mg/dL (ref 65–99)
GLUCOSE-CAPILLARY: 110 mg/dL — AB (ref 65–99)
Glucose-Capillary: 111 mg/dL — ABNORMAL HIGH (ref 65–99)

## 2015-08-26 NOTE — Progress Notes (Signed)
Nutrition Note  Spoke with pt. Pt concerned about CBG's being too low for exercise and having to have a pre-exercise snack for the past 2-3 weeks. Pt states he has an appointment with Dr. [name] in 2 weeks. Pt interested in trying a decreased dose of Lantus to see if pre-exercise CBG's improve. Pt is going to try taking 55 units of Lantus at night. Will monitor CBG's with pt and team. Continue client-centered nutrition education by RD as part of interdisciplinary care.  Monitor and evaluate progress toward nutrition goal with team.  Derek Mound, M.Ed, RD, LDN, CDE 08/26/2015 11:17 AM

## 2015-08-27 ENCOUNTER — Encounter: Payer: Self-pay | Admitting: Internal Medicine

## 2015-08-27 ENCOUNTER — Encounter: Payer: Self-pay | Admitting: Family Medicine

## 2015-08-27 ENCOUNTER — Encounter: Payer: Self-pay | Admitting: Cardiology

## 2015-08-27 ENCOUNTER — Ambulatory Visit (INDEPENDENT_AMBULATORY_CARE_PROVIDER_SITE_OTHER): Payer: 59 | Admitting: Internal Medicine

## 2015-08-27 ENCOUNTER — Ambulatory Visit (INDEPENDENT_AMBULATORY_CARE_PROVIDER_SITE_OTHER): Payer: 59 | Admitting: Cardiology

## 2015-08-27 VITALS — BP 112/74 | HR 89 | Ht 67.0 in | Wt 224.8 lb

## 2015-08-27 VITALS — BP 158/90 | HR 84 | Ht 67.0 in | Wt 224.8 lb

## 2015-08-27 DIAGNOSIS — I442 Atrioventricular block, complete: Secondary | ICD-10-CM

## 2015-08-27 DIAGNOSIS — Z950 Presence of cardiac pacemaker: Secondary | ICD-10-CM | POA: Diagnosis not present

## 2015-08-27 DIAGNOSIS — I2510 Atherosclerotic heart disease of native coronary artery without angina pectoris: Secondary | ICD-10-CM | POA: Diagnosis not present

## 2015-08-27 DIAGNOSIS — I10 Essential (primary) hypertension: Secondary | ICD-10-CM | POA: Diagnosis not present

## 2015-08-27 DIAGNOSIS — I5022 Chronic systolic (congestive) heart failure: Secondary | ICD-10-CM

## 2015-08-27 DIAGNOSIS — Z45018 Encounter for adjustment and management of other part of cardiac pacemaker: Secondary | ICD-10-CM

## 2015-08-27 DIAGNOSIS — N184 Chronic kidney disease, stage 4 (severe): Secondary | ICD-10-CM

## 2015-08-27 LAB — CUP PACEART INCLINIC DEVICE CHECK
Battery Remaining Longevity: 61 mo
Brady Statistic AP VS Percent: 0 %
Brady Statistic AS VP Percent: 30 %
Date Time Interrogation Session: 20170214153057
Implantable Lead Implant Date: 20100112
Implantable Lead Location: 753859
Implantable Lead Model: 5076
Lead Channel Impedance Value: 439 Ohm
Lead Channel Impedance Value: 559 Ohm
Lead Channel Pacing Threshold Amplitude: 0.625 V
Lead Channel Pacing Threshold Amplitude: 0.75 V
Lead Channel Pacing Threshold Amplitude: 1.25 V
Lead Channel Pacing Threshold Pulse Width: 0.4 ms
Lead Channel Setting Pacing Amplitude: 2.5 V
Lead Channel Setting Sensing Sensitivity: 2.8 mV
MDC IDC LEAD IMPLANT DT: 20100112
MDC IDC LEAD LOCATION: 753860
MDC IDC MSMT BATTERY IMPEDANCE: 645 Ohm
MDC IDC MSMT BATTERY VOLTAGE: 2.78 V
MDC IDC MSMT LEADCHNL RA PACING THRESHOLD AMPLITUDE: 1.375 V
MDC IDC MSMT LEADCHNL RA PACING THRESHOLD PULSEWIDTH: 0.4 ms
MDC IDC MSMT LEADCHNL RA PACING THRESHOLD PULSEWIDTH: 0.4 ms
MDC IDC MSMT LEADCHNL RA SENSING INTR AMPL: 2 mV
MDC IDC MSMT LEADCHNL RV PACING THRESHOLD PULSEWIDTH: 0.4 ms
MDC IDC SET LEADCHNL RA PACING AMPLITUDE: 2.75 V
MDC IDC SET LEADCHNL RV PACING PULSEWIDTH: 0.4 ms
MDC IDC STAT BRADY AP VP PERCENT: 70 %
MDC IDC STAT BRADY AS VS PERCENT: 0 %

## 2015-08-27 NOTE — Progress Notes (Signed)
Patient Care Team: [name], MD as PCP - General (Family Medicine) Dimitri Ped, RN as [facility]   HPI  Rodney Farley is a 82 y.o. male Seen in follow-up for a pacemaker implanted in 2010 elsewhere. We saw him to establish in 2013. He has a history of complete heart block.     He has history of ischemic heart disease with prior MI. Myoview 2002 demonstrated ejection fraction of 49% with prior MI but no ischemia. Echocardiogram 3/16 ejection fraction was 35-40%  He underwent catheterization again 9/16 and multivessel PCI. Repeat echocardiogram 12/16 demonstrated persistent LV dysfunction with an EF of 25-30%.  He is referred for consideration of device generator revision.   he is feeling better although he still quite limited to less than 100 yards. He does not have nocturnal dyspnea. He has some peripheral edema.  Review of old data demonstrates our initial assessment of LV function (09/2008)  is recorded follows his pacemaker implantation which was accomplished January 2010.      Past Medical History  Diagnosis Date  . COPD (chronic obstructive pulmonary disease) (HCC)     Severe  . Hypercholesterolemia   . Depression   . Coronary artery disease     a. s/p MI in 1994/1995 while in [place] s/p questionable PCI. 03/2015: progression of disease, for staged PCI.  [name] Hypertension   . Atrioventricular block, complete ([place])     a. 2010 s/p pacemaker.  . Pacemaker     medtronic  . Obesity   . TIA (transient ischemic attack) X 3  . Neuropathy (HCC)     IN LOWER EXTREMITIES  . GERD (gastroesophageal reflux disease)   . DOE (dyspnea on exertion) 03/28/2015  . CKD (chronic kidney disease), stage IV ([place])   . Type II diabetes mellitus ([place])   . Diabetic peripheral neuropathy ([place])   . Chronic systolic CHF (congestive heart failure) ([place])     a. LVEF 35-40% by echo 09/2014.  . MI (myocardial infarction) ([place]) 1994; 1995  .  Anginal pain ([place])   . Sleep apnea     "sleeps w/humidifyer when he panics and gets short of breath" (04/08/2015)  . Anxiety   . Arm pain 05/08/2015    LEFT ARM    Past Surgical History  Procedure Laterality Date  . Tonsillectomy    . Cholecystectomy open  ~ 1977  . Insert / replace / remove pacemaker  07/2008    Complete heart block status post DDD with good function  . Hiatal hernia repair  1977  . Cardiac catheterization N/A 03/29/2015    Procedure: Right/Left Heart Cath and Coronary Angiography;  Surgeon: Farren M [name], MD;  Location: [facility];  Service: Cardiovascular;  Laterality: N/A;  . Cardiac catheterization  1995    "after my MI; put me on heart RX after cath"  . Cardiac catheterization N/A 04/09/2015    Procedure: Coronary Stent Intervention;  Surgeon: Hyatt M [name], MD;  Location: [facility];  Service: Cardiovascular;  Laterality: N/A;    Current Outpatient Prescriptions  Medication Sig Dispense Refill  . aspirin EC 81 MG tablet Take 81 mg by mouth daily.    [name] atorvastatin (LIPITOR) 40 MG tablet Take 1 tablet (40 mg total) by mouth daily. 90 tablet 3  . carvedilol (COREG) 12.5 MG tablet Take 1 tablet (12.5 mg total) by mouth 2 (two) times daily with a meal. 60 tablet 11  .  clopidogrel (PLAVIX) 75 MG tablet Take 1 tablet (75 mg total) by mouth daily with breakfast. 30 tablet 6  . colchicine 0.6 MG tablet Take 0.5 tablets (0.3 mg total) by mouth 3 (three) times daily. 90 tablet 3  . furosemide (LASIX) 20 MG tablet Take a 1/2 tablet daily to equal a total of 30 mg or as directed (Patient taking differently: 20 mg. Take 1 and 1/2 tablets 30mg  daily) 30 tablet 1  . hydrALAZINE (APRESOLINE) 25 MG tablet Take 1.5 tablets (37.5 mg total) by mouth 2 (two) times daily. 90 tablet 11  . Insulin Pen Needle (B-D UF III MINI PEN NEEDLES) 31G X 5 MM MISC Use to test blood sugar 3 times daily. Dx code: 250.02 100 each 11  . isosorbide mononitrate (IMDUR) 30 MG 24 hr  tablet Take 1 tablet (30 mg total) by mouth daily. 30 tablet 6  . LANTUS SOLOSTAR 100 UNIT/ML Solostar Pen INJECT 60 UNITS UNDER THE SKIN ONCE DAILY 60 mL 0  . nitroGLYCERIN (NITROSTAT) 0.4 MG SL tablet Place 1 tablet (0.4 mg total) under the tongue every 5 (five) minutes x 3 doses as needed for chest pain. 25 tablet 3  . OVER THE COUNTER MEDICATION Place 1 drop into both eyes 3 (three) times daily. OTC Bausch-Lomb eye bath    . pantoprazole (PROTONIX) 40 MG tablet Take 1 tablet (40 mg total) by mouth daily. 30 tablet 6  . sertraline (ZOLOFT) 100 MG tablet Take 1.5 tablets (150 mg total) by mouth daily. 135 tablet 1  . vitamin B-12 (CYANOCOBALAMIN) 1000 MCG tablet Take 1,000 mcg by mouth daily.     No current facility-administered medications for this visit.    Allergies  Allergen Reactions  . Bee Venom Anaphylaxis  . Prednisone Other (See Comments)    hallucinations  . Zocor [Simvastatin] Nausea Only and Other (See Comments)    Headache with brand name only.  Can take the generic.    Review of Systems negative except from HPI and PMH  Physical Exam BP 112/74 mmHg  Pulse 89  Ht 5\' 7"  (1.702 m)  Wt 224 lb 12.8 oz (101.969 kg)  BMI 35.20 kg/m2 Well developed and well nourished in no acute distress HENT normal E scleral and icterus clear Neck Supple JVP flat; carotids brisk and full Clear to ausculation  Regular rate and rhythm, no murmurs gallops or rub Soft with active bowel sounds No clubbing cyanosis  Edema Alert and oriented, grossly normal motor and sensory function Skin Warm and Dry     Assessment and  Plan Complete heart block  Ischemic heart disease with prior MI  Pacemaker  Medtronic [name] device was interrogated.  The information was reviewed. No changes were made in the programming.     Hyperlipidemia  Renal insufficiency  Depression   He is much improved. His renal function is stable. Given his age and renal insufficiency, upgrade for only  ICD I don't think would be appropriate.  However, it is reasonable to consider CRT upgrade. Looking back, we do not have data related to his LV function prior to pacemaker insertion. However, within 2 months the LV function was 40-45%. It is unlikely that the LV function. That much 2 months of pacing; hence, I think it is likely that he had a preceding cardiomyopathy. This then much lower likelihood of improvement in LV systolic function with CRT upgrade. However, there is still some significant likelihood probably in the 40-50% range whereby LV function could improve and  functional status improved with resynchronization. He would like to pursue that. In this vein, and tendon, it becomes less unreasonable to advance an icd lead at the time of the procedure. he will consider this with his family.  We have reviewed the benefits and risks of generator replacement.  These include but are not limited to lead fracture and infection.  The patient understands, agrees and is willing to proceed.     More than 50% of 45 min was spent in counseling related to the above

## 2015-08-27 NOTE — Progress Notes (Signed)
CARDIOLOGY OFFICE NOTE  Date:  08/27/2015    Rodney Farley Date of Birth: [birth year] Medical Record [id number]  PCP:  [name], MD  Cardiologist:  [name]    Chief Complaint  Patient presents with  . Follow-up    echo//pt states no Sx.    History of Present Illness: Rodney Farley is a 82 y.o. male who is seen for follow up CAD and CHF.   He has a history of CAD (s/p MI in 1994/1995 while in [place] s/p questionable PCI, , CHB 2010 s/p pacemaker, CKD stage IV, DM, HTN, HLD, remote tobacco abuse, COPD, moderate sleep apnea, and chronic systolic CHF.    Last year he had progression of DOE, chest discomfort and chest pounding, limiting his level of exertion. 2D Echo 09/2014 showed EF 35-40% with +WMA, grade 1 DD, mild MR. PFTs looked remarkably good. Dr. [name] planned to do a [facility] and admitted him for overnight hydration but Cr increased to 2.44 and procedure was cancelled. He was then evaluated by renal with return in Cr to 1.8.  He was then admitted for a second attempt at cardiac cath on 03/28/15 and Cr was 2.14 (stable compared to prior outpatient 2.15). LE duplex negative for DVT. Cath showed severe 3V CAD - the mid LAD, second diagonal, and third OM branches appeared suitable for PCI +/- distal LCx. The other option would be CABG. After discussion concerning risk and benefits hewas treated with PCI.   Patient underwent Multivessel PCI including stenting of the mid LAD, second diagonal, and third OM with DES on 04/09/15.  Distal 70% circumflex lesion will be treated medically.He was started on aspirin and Plavix. Hydralazine was added for afterload reduction and better blood pressure control.   Since his procedure he has had a marked improvement in his symptoms of dyspnea.  No chest pain. Breathing is much better. Able to walk in the neighborhood now and is actively participating in Cardiac Rehab.  He does still note his energy level is decreased. He did have some  LE swelling that improved with increase in lasix to 30 mg daily. Weight is down 2 lbs. BP at Cardiac Rehab has been well controlled.   Past Medical History  Diagnosis Date  . COPD (chronic obstructive pulmonary disease) (HCC)     Severe  . Hypercholesterolemia   . Depression   . Coronary artery disease     a. s/p MI in 1994/1995 while in [place] s/p questionable PCI. 03/2015: progression of disease, for staged PCI.  [name] Hypertension   . Atrioventricular block, complete ([place])     a. 2010 s/p pacemaker.  . Pacemaker     medtronic  . Obesity   . TIA (transient ischemic attack) X 3  . Neuropathy (HCC)     IN LOWER EXTREMITIES  . GERD (gastroesophageal reflux disease)   . DOE (dyspnea on exertion) 03/28/2015  . CKD (chronic kidney disease), stage IV ([place])   . Type II diabetes mellitus ([place])   . Diabetic peripheral neuropathy ([place])   . Chronic systolic CHF (congestive heart failure) ([place])     a. LVEF 35-40% by echo 09/2014.  . MI (myocardial infarction) ([place]) 1994; 1995  . Anginal pain ([place])   . Sleep apnea     "sleeps w/humidifyer when he panics and gets short of breath" (04/08/2015)  . Anxiety   . Arm pain 05/08/2015    LEFT ARM    Past Surgical History  Procedure Laterality Date  .  Tonsillectomy    . Cholecystectomy open  ~ 1977  . Insert / replace / remove pacemaker  07/2008    Complete heart block status post DDD with good function  . Hiatal hernia repair  1977  . Cardiac catheterization N/A 03/29/2015    Procedure: Right/Left Heart Cath and Coronary Angiography;  Surgeon: Drevion M [name], MD;  Location: [facility];  Service: Cardiovascular;  Laterality: N/A;  . Cardiac catheterization  1995    "after my MI; put me on heart RX after cath"  . Cardiac catheterization N/A 04/09/2015    Procedure: Coronary Stent Intervention;  Surgeon: Houston M [name], MD;  Location: [facility];  Service: Cardiovascular;  Laterality: N/A;     Medications: Current Outpatient  Prescriptions  Medication Sig Dispense Refill  . aspirin EC 81 MG tablet Take 81 mg by mouth daily.    [name] atorvastatin (LIPITOR) 40 MG tablet Take 1 tablet (40 mg total) by mouth daily. 90 tablet 3  . carvedilol (COREG) 12.5 MG tablet Take 1 tablet (12.5 mg total) by mouth 2 (two) times daily with a meal. 60 tablet 11  . clopidogrel (PLAVIX) 75 MG tablet Take 1 tablet (75 mg total) by mouth daily with breakfast. 30 tablet 6  . colchicine 0.6 MG tablet Take 0.5 tablets (0.3 mg total) by mouth 3 (three) times daily. 90 tablet 3  . furosemide (LASIX) 20 MG tablet Take a 1/2 tablet daily to equal a total of 30 mg or as directed 30 tablet 1  . hydrALAZINE (APRESOLINE) 25 MG tablet Take 1.5 tablets (37.5 mg total) by mouth 2 (two) times daily. 90 tablet 11  . Insulin Pen Needle (B-D UF III MINI PEN NEEDLES) 31G X 5 MM MISC Use to test blood sugar 3 times daily. Dx code: 250.02 100 each 11  . isosorbide mononitrate (IMDUR) 30 MG 24 hr tablet Take 1 tablet (30 mg total) by mouth daily. 30 tablet 6  . LANTUS SOLOSTAR 100 UNIT/ML Solostar Pen INJECT 60 UNITS UNDER THE SKIN ONCE DAILY 60 mL 0  . nitroGLYCERIN (NITROSTAT) 0.4 MG SL tablet Place 1 tablet (0.4 mg total) under the tongue every 5 (five) minutes x 3 doses as needed for chest pain. 25 tablet 3  . OVER THE COUNTER MEDICATION Place 1 drop into both eyes 3 (three) times daily. OTC Bausch-Lomb eye bath    . pantoprazole (PROTONIX) 40 MG tablet Take 1 tablet (40 mg total) by mouth daily. 30 tablet 6  . sertraline (ZOLOFT) 100 MG tablet Take 1.5 tablets (150 mg total) by mouth daily. 135 tablet 1  . vitamin B-12 (CYANOCOBALAMIN) 1000 MCG tablet Take 1,000 mcg by mouth daily.     No current facility-administered medications for this visit.    Allergies: Allergies  Allergen Reactions  . Bee Venom Anaphylaxis  . Prednisone Other (See Comments)    hallucinations  . Zocor [Simvastatin] Nausea Only and Other (See Comments)    Headache with brand name  only.  Can take the generic.    Social History: The patient  reports that he quit smoking about 8 years ago. His smoking use included Cigarettes. He has a 81 pack-year smoking history. He has never used smokeless tobacco. He reports that he drinks alcohol. He reports that he does not use illicit drugs.   Family History: [name] family history includes Heart attack in his brother; Leukemia in his father; Stroke in his mother and sister.   Review of Systems: Please  see the history of present illness.   Otherwise, the review of systems is positive for none.   All other systems are reviewed and negative.   Physical Exam: VS:  BP 158/90 mmHg  Pulse 84  Ht 5\' 7"  (1.702 m)  Wt 101.969 kg (224 lb 12.8 oz)  BMI 35.20 kg/m2 .  BMI Body mass index is 35.2 kg/(m^2).  Wt Readings from Last 3 Encounters:  08/27/15 101.969 kg (224 lb 12.8 oz)  07/18/15 102.541 kg (226 lb 1 oz)  07/08/15 102.785 kg (226 lb 9.6 oz)    General: Pleasant. Well developed, well nourished and in no acute distress.  HEENT: Normal. Neck: Supple, no JVD, carotid bruits, or masses noted.  Cardiac: Regular rate and rhythm. No murmurs, rubs, or gallops. Tr edema.  Respiratory:  Lungs are clear to auscultation bilaterally with normal work of breathing.  GI: Soft and nontender.  MS: No deformity or atrophy. Gait and ROM intact. Skin: Warm and dry. Color is normal.  Neuro:  Strength and sensation are intact and no gross focal deficits noted.  Psych: Alert, appropriate and with normal affect.   LABORATORY DATA:  EKG:  EKG is not ordered today.  Lab Results  Component Value Date   WBC 9.2 06/26/2015   HGB 11.6* 06/26/2015   HCT 33.3* 06/26/2015   PLT 212 06/26/2015   GLUCOSE 104* 07/18/2015   CHOL 171 06/26/2015   TRIG 317* 06/26/2015   HDL 43 06/26/2015   LDLCALC 65 06/26/2015   ALT 16 06/26/2015   AST 18 06/26/2015   NA 138 07/18/2015   K 4.3 07/18/2015   CL 107 07/18/2015   CREATININE 2.06*  07/18/2015   BUN 47* 07/18/2015   CO2 24 07/18/2015   TSH 1.606 03/28/2015   PSA 0.55 09/19/2014   INR 1.09 05/08/2015   HGBA1C 6.9 06/26/2015   MICROALBUR 68.3* 09/19/2014    BNP (last 3 results) No results for input(s): BNP in the last 8760 hours.  ProBNP (last 3 results) No results for input(s): PROBNP in the last 8760 hours.   Other Studies Reviewed Today:  Echo Study Conclusions from 06/14/15:  Study Conclusions  - Left ventricle: The cavity size was normal. Wall thickness was increased in a pattern of mild LVH. There was focal basal hypertrophy. Systolic function was severely reduced. The estimated ejection fraction was in the range of 25% to 30%. Akinesis of the mid-apicalanteroseptal and apical myocardium. Doppler parameters are consistent with abnormal left ventricular relaxation (grade 1 diastolic dysfunction). - Left atrium: The atrium was mildly dilated. - Right atrium: The atrium was mildly dilated.   Cardiac Cath/PCI Conclusion from 03/2015     Acute Mrg lesion, 100% stenosed.  Ost 1st Diag to 1st Diag lesion, 90% stenosed.  Dist Cx lesion, 70% stenosed.  2nd Diag lesion, 90% stenosed. There is a 0% residual stenosis post intervention.  A drug-eluting stent was placed.  Mid LAD lesion, 90% stenosed. There is a 0% residual stenosis post intervention.  A drug-eluting stent was placed.  3rd Mrg lesion, 90% stenosed. There is a 0% residual stenosis post intervention.  A drug-eluting stent was placed.  1. Successful stenting of the mid LAD, second diagonal, and OM3 with DES.  Plan: observe overnight with hydration. Assess renal function in AM. Plan to resume lasix at DC due to elevated EDP. DAPT for one year. Anticipate DC in am if no complications. Will need to reassess LV function in 3 months. If EF remains low consider upgrading  pacemaker to a CRT/ICD device.      Assessment/Plan: 1. CAD - s/p  left heart catheterization revealing  Mid LAD lesion, 90% stenosed, 2nd Diag lesion, 90% stenosed, 3rd Mrg lesion, 90% stenosed. He received drug-eluting stents to each lesion. Distal 70% circumflex lesion will be treated medically. Significant symptomatic improvement. He is on DAPT with Plavix and aspirin - will plan for  long term. He is to continue cardiac Rehab.   2. ICM  3. Chronic systolic HF - EF persistently low despite revascularization 25-30%. He is on optimal therapy with Coreg, diuretics, hydralazine, and nitrates. Not a candidate for ACEi/ARB due to CKD. Full discussion today concerning potential upgrade of pacemaker to biV device and also defibrillator. He is very anxious to have this done. States he has a lot to live for and is very appreciative of all our efforts at improving his health.   4. HTN - controlled at Rehab.   5. HLD - on statin therapy  Current medicines are reviewed with the patient today.  The patient does not have concerns regarding medicines other than what has been noted above.  The following changes have been made:  See above.  Labs/ tests ordered today include:    No orders of the defined types were placed in this encounter.     Disposition:   FU with Dr. [name] 3 months. Will schedule follow up with Dr. Caryl Comes to consider upgrade of pacemaker to a BiV-ICD.   Patient is agreeable to this plan and will call if any problems develop in the interim.   Signed: Handsome [name] MD, [facility]    08/27/2015 10:03 AM

## 2015-08-27 NOTE — Patient Instructions (Signed)
Medication Instructions: - Your physician recommends that you continue on your current medications as directed. Please refer to the Current Medication list given to you today.  Labwork: - none  Procedures/Testing: - Dr. Caryl Comes has recommended that we schedule you for an upgrade of your device (CRT/ Bi-Ventricular device). - Please call Dr. Olin Pia nurse, Nira Conn, when you are ready to schedule this- [phone].  Potential dates: Friday 09/20/15 Monday 09/23/15 Wednesday 10/02/15 Wednesday 10/09/15  Follow-Up: - pending the date of your procedure  Any Additional Special Instructions Will Be Listed Below (If Applicable).     If you need a refill on your cardiac medications before your next appointment, please call your pharmacy.

## 2015-08-27 NOTE — Patient Instructions (Signed)
We will schedule you an appointment to see Dr. Caryl Comes for consideration of upgrading your pacemaker to a defibrillator/ biventricular pacemaker.  Continue your current therapy and Rehab.  I will see you in 3 months.

## 2015-08-28 ENCOUNTER — Encounter (HOSPITAL_COMMUNITY): Payer: 59

## 2015-08-28 ENCOUNTER — Ambulatory Visit (HOSPITAL_COMMUNITY): Payer: 59

## 2015-08-29 ENCOUNTER — Telehealth: Payer: Self-pay | Admitting: Internal Medicine

## 2015-08-29 DIAGNOSIS — I255 Ischemic cardiomyopathy: Secondary | ICD-10-CM

## 2015-08-29 MED FILL — TAMSULOSIN HCL 0.4 MG CAP: 0.4 | 90 days supply | Qty: 90 | Fill #2

## 2015-08-29 NOTE — Telephone Encounter (Signed)
I spoke with the patient and advised him I could schedule him for March 13. I inquired if he could go 1st case. He will check with his family and call me back to let me know today or on Monday.

## 2015-08-29 NOTE — Telephone Encounter (Signed)
Patient called and wants to schedule his procedure.   Pt would like 3/13  if still avaible.   Please give him a call back.

## 2015-08-29 NOTE — Telephone Encounter (Signed)
Patient is scheduled for 09/23/15 at 9:30 am. I will forward a copy of his results to him via MyChart next week.

## 2015-08-30 ENCOUNTER — Encounter (HOSPITAL_COMMUNITY): Payer: 59

## 2015-08-30 ENCOUNTER — Ambulatory Visit (HOSPITAL_COMMUNITY): Payer: 59

## 2015-08-30 ENCOUNTER — Encounter (HOSPITAL_COMMUNITY)
Admission: RE | Admit: 2015-08-30 | Discharge: 2015-08-30 | Disposition: A | Discharge status: Home / Self Care | Payer: 59 | Source: Ambulatory Visit | Attending: Cardiology | Admitting: Cardiology

## 2015-08-30 DIAGNOSIS — Z959 Presence of cardiac and vascular implant and graft, unspecified: Secondary | ICD-10-CM | POA: Diagnosis not present

## 2015-08-30 LAB — GLUCOSE, CAPILLARY
GLUCOSE-CAPILLARY: 130 mg/dL — AB (ref 65–99)
Glucose-Capillary: 117 mg/dL — ABNORMAL HIGH (ref 65–99)

## 2015-09-02 ENCOUNTER — Encounter (HOSPITAL_COMMUNITY)
Admission: RE | Admit: 2015-09-02 | Discharge: 2015-09-02 | Disposition: A | Discharge status: Home / Self Care | Payer: 59 | Source: Ambulatory Visit | Attending: Cardiology | Admitting: Cardiology

## 2015-09-02 ENCOUNTER — Encounter (HOSPITAL_COMMUNITY): Payer: 59

## 2015-09-02 ENCOUNTER — Ambulatory Visit: Payer: 59 | Admitting: Cardiology

## 2015-09-02 DIAGNOSIS — Z959 Presence of cardiac and vascular implant and graft, unspecified: Secondary | ICD-10-CM | POA: Diagnosis not present

## 2015-09-02 LAB — GLUCOSE, CAPILLARY
GLUCOSE-CAPILLARY: 128 mg/dL — AB (ref 65–99)
GLUCOSE-CAPILLARY: 97 mg/dL (ref 65–99)

## 2015-09-04 ENCOUNTER — Encounter (HOSPITAL_COMMUNITY)
Admission: RE | Admit: 2015-09-04 | Discharge: 2015-09-04 | Disposition: A | Discharge status: Home / Self Care | Payer: 59 | Source: Ambulatory Visit | Attending: Cardiology | Admitting: Cardiology

## 2015-09-04 ENCOUNTER — Encounter (HOSPITAL_COMMUNITY): Payer: 59

## 2015-09-04 DIAGNOSIS — Z959 Presence of cardiac and vascular implant and graft, unspecified: Secondary | ICD-10-CM | POA: Diagnosis not present

## 2015-09-04 LAB — GLUCOSE, CAPILLARY
Glucose-Capillary: 136 mg/dL — ABNORMAL HIGH (ref 65–99)
Glucose-Capillary: 104 mg/dL — ABNORMAL HIGH (ref 65–99)

## 2015-09-05 ENCOUNTER — Ambulatory Visit: Payer: 59

## 2015-09-05 MED FILL — CLOPIDOGREL 75 MG TABLET: 75 | 30 days supply | Qty: 30 | Fill #4

## 2015-09-06 ENCOUNTER — Telehealth (HOSPITAL_COMMUNITY): Payer: Self-pay | Admitting: Internal Medicine

## 2015-09-06 ENCOUNTER — Encounter (HOSPITAL_COMMUNITY): Payer: 59

## 2015-09-09 ENCOUNTER — Encounter (HOSPITAL_COMMUNITY): Payer: 59

## 2015-09-09 ENCOUNTER — Encounter (HOSPITAL_COMMUNITY)
Admission: RE | Admit: 2015-09-09 | Discharge: 2015-09-09 | Disposition: A | Discharge status: Home / Self Care | Payer: 59 | Source: Ambulatory Visit | Attending: Cardiology | Admitting: Cardiology

## 2015-09-09 DIAGNOSIS — Z959 Presence of cardiac and vascular implant and graft, unspecified: Secondary | ICD-10-CM | POA: Diagnosis not present

## 2015-09-09 LAB — GLUCOSE, CAPILLARY
GLUCOSE-CAPILLARY: 138 mg/dL — AB (ref 65–99)
Glucose-Capillary: 149 mg/dL — ABNORMAL HIGH (ref 65–99)

## 2015-09-09 MED FILL — BACITRACIN 500 UNIT/GM OPHT: 500 | 7 days supply | Qty: 4 | Fill #1

## 2015-09-10 ENCOUNTER — Encounter: Payer: Self-pay | Admitting: Internal Medicine

## 2015-09-11 ENCOUNTER — Other Ambulatory Visit: Payer: Self-pay | Admitting: *Deleted

## 2015-09-11 ENCOUNTER — Encounter (HOSPITAL_COMMUNITY): Payer: 59

## 2015-09-11 ENCOUNTER — Encounter: Payer: Self-pay | Admitting: *Deleted

## 2015-09-11 DIAGNOSIS — I255 Ischemic cardiomyopathy: Secondary | ICD-10-CM

## 2015-09-11 DIAGNOSIS — Z01812 Encounter for preprocedural laboratory examination: Secondary | ICD-10-CM

## 2015-09-13 ENCOUNTER — Encounter (HOSPITAL_COMMUNITY): Payer: 59

## 2015-09-16 ENCOUNTER — Encounter (HOSPITAL_COMMUNITY)
Admission: RE | Admit: 2015-09-16 | Discharge: 2015-09-16 | Disposition: A | Discharge status: Home / Self Care | Payer: 59 | Source: Ambulatory Visit | Attending: Cardiology | Admitting: Cardiology

## 2015-09-16 ENCOUNTER — Telehealth: Payer: Self-pay | Admitting: Cardiology

## 2015-09-16 ENCOUNTER — Other Ambulatory Visit: Payer: 59

## 2015-09-16 ENCOUNTER — Other Ambulatory Visit: Payer: Self-pay | Admitting: Internal Medicine

## 2015-09-16 ENCOUNTER — Encounter (HOSPITAL_COMMUNITY): Payer: 59

## 2015-09-16 DIAGNOSIS — Z959 Presence of cardiac and vascular implant and graft, unspecified: Secondary | ICD-10-CM | POA: Insufficient documentation

## 2015-09-16 DIAGNOSIS — I429 Cardiomyopathy, unspecified: Secondary | ICD-10-CM

## 2015-09-16 LAB — GLUCOSE, CAPILLARY
GLUCOSE-CAPILLARY: 133 mg/dL — AB (ref 65–99)
Glucose-Capillary: 127 mg/dL — ABNORMAL HIGH (ref 65–99)

## 2015-09-16 NOTE — Telephone Encounter (Signed)
Received a call from [facility] [place].Stated patient became light headed,dizzy while exercising.Orthostatic B/P supine 134/70 pulse 72 sitting 115/60 pulse 64,standing 115/70 pulse 64.Dr.Jordan out of office.I will speak to DOD Dr.Kelly.

## 2015-09-16 NOTE — Telephone Encounter (Signed)
[name] is calling because Mr. Rodney Farley was lightheaded

## 2015-09-16 NOTE — Telephone Encounter (Signed)
Spoke to DOD Dr.Kelly he advised to hold lasix.Patient stated he has been having occasional dizziness and light headed for the last couple of weeks.Stated he feels better at present.Advised I will speak to Dr.Jordan 09/17/15 and call you back.

## 2015-09-16 NOTE — Progress Notes (Addendum)
Patient reported feeling lightheaded on the nustep at cardiac rehab. Blood pressure 94/47. Patient given Gatorade and was noted to stagger when getting off of the nustep. Blood pressure 125/68 sitting. Standing blood pressure 125/60. Patient denied feeling lightheaded after changing from a sitting to a standing position.Mr Mruk's telemetry rhythm is AV Paced.    Patient ambulated in the hallway with assistance times two halfway around the track. Initial blood pressure was 101/59 sitting. Standing blood pressure 133/68. Patient walked the second half of the gym with assistance times 2. Repeat sitting blood pressure 97/59. Patient given more Gatorade to drink. Orthostatic blood pressures checked in the treatment room Lying blood pressure 134/70, sitting blood pressure 115/66. Standing blood pressure 115/70. Matei reported feeling lightheaded although there was no change in sitting to standing position  With blood pressure. Elly Modena, Dr Doug Sou nurse called and notified of Mr Medico's symptoms and vital signs. Malachy Mood said she would talk to Dr Claiborne Billings about the patient's current medications and contact him at home with any changes. Mr Cade's daughter in law came to pick Mr Hargreaves up from cardiac rehab. I asked the patient not to drive home. Exit standing blood pressure 107/66 standing. No complaints or symptoms upon discharge from cardiac rehab. Will fax exercise flow sheets to Dr. Doug Sou  office for review  With today's vital signs and ECG tracings.

## 2015-09-17 NOTE — Telephone Encounter (Signed)
Returned call to patient.Spoke to [place] he advised to continue lasix.Advised to continue cardiac rehab and call back if needed.

## 2015-09-18 ENCOUNTER — Encounter (HOSPITAL_COMMUNITY): Payer: 59

## 2015-09-18 ENCOUNTER — Telehealth: Payer: Self-pay | Admitting: Cardiology

## 2015-09-18 ENCOUNTER — Other Ambulatory Visit: Payer: Self-pay | Admitting: *Deleted

## 2015-09-18 DIAGNOSIS — I255 Ischemic cardiomyopathy: Secondary | ICD-10-CM

## 2015-09-18 NOTE — Telephone Encounter (Signed)
Pt went to [facility] to have bloodwork drawn. Orders in system for [facility] were reordered and released  - [facility] rep aware.

## 2015-09-19 LAB — CBC WITH DIFFERENTIAL/PLATELET
BASOS ABS: 0 10*3/uL (ref 0.0–0.2)
Basos: 1 %
EOS (ABSOLUTE): 0.3 10*3/uL (ref 0.0–0.4)
Eos: 4 %
HEMOGLOBIN: 13.5 g/dL (ref 12.6–17.7)
Hematocrit: 39.1 % (ref 37.5–51.0)
LYMPHS ABS: 1.7 10*3/uL (ref 0.7–3.1)
Lymphs: 22 %
MCH: 30.5 pg (ref 26.6–33.0)
MCHC: 34.5 g/dL (ref 31.5–35.7)
MCV: 88 fL (ref 79–97)
MONOS ABS: 0.8 10*3/uL (ref 0.1–0.9)
Monocytes: 10 %
NEUTROS PCT: 63 %
Neutrophils Absolute: 5 10*3/uL (ref 1.4–7.0)
PLATELETS: 207 10*3/uL (ref 150–379)
RBC: 4.43 x10E6/uL (ref 4.14–5.80)
RDW: 15.7 % — ABNORMAL HIGH (ref 12.3–15.4)
WBC: 7.8 10*3/uL (ref 3.4–10.8)

## 2015-09-19 LAB — PROTIME-INR
INR: 0.9 (ref 0.8–1.2)
PROTHROMBIN TIME: 9.9 s (ref 9.1–12.0)

## 2015-09-19 LAB — BASIC METABOLIC PANEL
BUN / CREAT RATIO: 21 (ref 10–22)
BUN: 45 mg/dL — AB (ref 8–27)
CALCIUM: 9.2 mg/dL (ref 8.6–10.2)
CHLORIDE: 104 mmol/L (ref 96–106)
CO2: 28 mmol/L (ref 18–29)
CREATININE: 2.13 mg/dL — AB (ref 0.76–1.27)
GFR calc Af Amer: 33 mL/min/{1.73_m2} — ABNORMAL LOW (ref >59)
GFR calc non Af Amer: 28 mL/min/{1.73_m2} — ABNORMAL LOW (ref >59)
GLUCOSE: 120 mg/dL — AB (ref 65–99)
Potassium: 4.8 mmol/L (ref 3.5–5.2)
Sodium: 139 mmol/L (ref 134–144)

## 2015-09-20 ENCOUNTER — Encounter (HOSPITAL_COMMUNITY): Payer: 59

## 2015-09-20 ENCOUNTER — Telehealth: Payer: Self-pay | Admitting: Internal Medicine

## 2015-09-20 MED FILL — ISOSORBIDE MN ER 30 MG TAB: 30 | 30 days supply | Qty: 30 | Fill #5

## 2015-09-20 NOTE — Telephone Encounter (Signed)
Pt says his creat is up,he wants to know if they will still do implant on Monday?

## 2015-09-20 NOTE — Telephone Encounter (Signed)
Calling stating he has seen that his creat is elevated and wanted to know if he should still have the BIV ICD Insertion on Mon 3/13.  Showed Creat 2.13 to Sandria Senter and advised that he should be fine to have procedure.  He was advised that he should have procedure and they could give him some extra fluid if necessary.  He will be there as scheduled.

## 2015-09-22 MED ORDER — DEXTROSE 5 % IV SOLN
3.0000 g | INTRAVENOUS | Status: DC
  Filled 2015-09-22: qty 3000

## 2015-09-22 MED ORDER — CEFAZOLIN SODIUM-DEXTROSE 2-3 GM-% IV SOLR: 2.0000 g | INTRAVENOUS | Status: DC

## 2015-09-23 ENCOUNTER — Ambulatory Visit (HOSPITAL_COMMUNITY)
Admission: RE | Admit: 2015-09-23 | Discharge: 2015-09-24 | Disposition: A | Discharge status: Home / Self Care | Payer: 59 | Source: Ambulatory Visit | Attending: Internal Medicine | Admitting: Internal Medicine

## 2015-09-23 ENCOUNTER — Encounter (HOSPITAL_COMMUNITY)
Admission: RE | Disposition: A | Discharge status: Home / Self Care | Payer: Self-pay | Source: Ambulatory Visit | Attending: Internal Medicine

## 2015-09-23 ENCOUNTER — Encounter (HOSPITAL_COMMUNITY): Payer: 59

## 2015-09-23 DIAGNOSIS — Z7982 Long term (current) use of aspirin: Secondary | ICD-10-CM | POA: Diagnosis not present

## 2015-09-23 DIAGNOSIS — I429 Cardiomyopathy, unspecified: Secondary | ICD-10-CM | POA: Diagnosis not present

## 2015-09-23 DIAGNOSIS — J449 Chronic obstructive pulmonary disease, unspecified: Secondary | ICD-10-CM | POA: Diagnosis not present

## 2015-09-23 DIAGNOSIS — F329 Major depressive disorder, single episode, unspecified: Secondary | ICD-10-CM | POA: Diagnosis not present

## 2015-09-23 DIAGNOSIS — I5022 Chronic systolic (congestive) heart failure: Secondary | ICD-10-CM | POA: Diagnosis not present

## 2015-09-23 DIAGNOSIS — I5020 Unspecified systolic (congestive) heart failure: Secondary | ICD-10-CM

## 2015-09-23 DIAGNOSIS — R600 Localized edema: Secondary | ICD-10-CM

## 2015-09-23 DIAGNOSIS — Z79899 Other long term (current) drug therapy: Secondary | ICD-10-CM | POA: Insufficient documentation

## 2015-09-23 DIAGNOSIS — Z794 Long term (current) use of insulin: Secondary | ICD-10-CM | POA: Insufficient documentation

## 2015-09-23 DIAGNOSIS — N184 Chronic kidney disease, stage 4 (severe): Secondary | ICD-10-CM | POA: Insufficient documentation

## 2015-09-23 DIAGNOSIS — I259 Chronic ischemic heart disease, unspecified: Secondary | ICD-10-CM | POA: Insufficient documentation

## 2015-09-23 DIAGNOSIS — E1142 Type 2 diabetes mellitus with diabetic polyneuropathy: Secondary | ICD-10-CM | POA: Insufficient documentation

## 2015-09-23 DIAGNOSIS — Z8673 Personal history of transient ischemic attack (TIA), and cerebral infarction without residual deficits: Secondary | ICD-10-CM | POA: Diagnosis not present

## 2015-09-23 DIAGNOSIS — I252 Old myocardial infarction: Secondary | ICD-10-CM | POA: Insufficient documentation

## 2015-09-23 DIAGNOSIS — I130 Hypertensive heart and chronic kidney disease with heart failure and stage 1 through stage 4 chronic kidney disease, or unspecified chronic kidney disease: Secondary | ICD-10-CM | POA: Insufficient documentation

## 2015-09-23 DIAGNOSIS — Z7902 Long term (current) use of antithrombotics/antiplatelets: Secondary | ICD-10-CM | POA: Diagnosis not present

## 2015-09-23 DIAGNOSIS — I509 Heart failure, unspecified: Secondary | ICD-10-CM

## 2015-09-23 DIAGNOSIS — I442 Atrioventricular block, complete: Secondary | ICD-10-CM | POA: Diagnosis not present

## 2015-09-23 DIAGNOSIS — E785 Hyperlipidemia, unspecified: Secondary | ICD-10-CM | POA: Insufficient documentation

## 2015-09-23 DIAGNOSIS — Z959 Presence of cardiac and vascular implant and graft, unspecified: Secondary | ICD-10-CM

## 2015-09-23 HISTORY — PX: EP IMPLANTABLE DEVICE: SHX172B

## 2015-09-23 LAB — BASIC METABOLIC PANEL
ANION GAP: 10 (ref 5–15)
BUN: 46 mg/dL — ABNORMAL HIGH (ref 6–20)
CALCIUM: 9.4 mg/dL (ref 8.9–10.3)
CO2: 26 mmol/L (ref 22–32)
CREATININE: 2.19 mg/dL — AB (ref 0.61–1.24)
Chloride: 105 mmol/L (ref 101–111)
GFR calc Af Amer: 31 mL/min — ABNORMAL LOW (ref >60)
GFR, EST NON AFRICAN AMERICAN: 27 mL/min — AB (ref >60)
GLUCOSE: 130 mg/dL — AB (ref 65–99)
Potassium: 4.3 mmol/L (ref 3.5–5.1)
Sodium: 141 mmol/L (ref 135–145)

## 2015-09-23 LAB — SURGICAL PCR SCREEN
MRSA, PCR: NEGATIVE
STAPHYLOCOCCUS AUREUS: NEGATIVE

## 2015-09-23 LAB — GLUCOSE, CAPILLARY
GLUCOSE-CAPILLARY: 223 mg/dL — AB (ref 65–99)
GLUCOSE-CAPILLARY: 175 mg/dL — AB (ref 65–99)
Glucose-Capillary: 118 mg/dL — ABNORMAL HIGH (ref 65–99)
Glucose-Capillary: 177 mg/dL — ABNORMAL HIGH (ref 65–99)

## 2015-09-23 SURGERY — BIV ICD INSERTION CRT-D

## 2015-09-23 MED ORDER — LIDOCAINE HCL (PF) 1 % IJ SOLN
INTRAMUSCULAR | Status: AC
  Filled 2015-09-23: qty 60

## 2015-09-23 MED ORDER — PANTOPRAZOLE SODIUM 40 MG PO TBEC
40.0000 mg | DELAYED_RELEASE_TABLET | Freq: Every day | ORAL | Status: DC
  Administered 2015-09-23 – 2015-09-24 (×2): 40 mg via ORAL
  Filled 2015-09-23 (×2): qty 1

## 2015-09-23 MED ORDER — FUROSEMIDE 20 MG PO TABS
30.0000 mg | ORAL_TABLET | Freq: Every day | ORAL | Status: DC
  Administered 2015-09-23 – 2015-09-24 (×3): 30 mg via ORAL
  Filled 2015-09-23: qty 1.5
  Filled 2015-09-23: qty 2

## 2015-09-23 MED ORDER — MIDAZOLAM HCL 5 MG/5ML IJ SOLN
INTRAMUSCULAR | Status: DC | PRN
  Administered 2015-09-23 (×3): 1 mg via INTRAVENOUS

## 2015-09-23 MED ORDER — ISOSORBIDE MONONITRATE ER 30 MG PO TB24
30.0000 mg | ORAL_TABLET | Freq: Every day | ORAL | Status: DC
  Administered 2015-09-23 – 2015-09-24 (×3): 30 mg via ORAL
  Filled 2015-09-23 (×2): qty 1

## 2015-09-23 MED ORDER — CEFAZOLIN SODIUM-DEXTROSE 2-3 GM-% IV SOLR
INTRAVENOUS | Status: AC
  Filled 2015-09-23: qty 50

## 2015-09-23 MED ORDER — NITROGLYCERIN 0.4 MG SL SUBL: 0.4000 mg | SUBLINGUAL_TABLET | SUBLINGUAL | Status: DC | PRN

## 2015-09-23 MED ORDER — ACETAMINOPHEN 325 MG PO TABS: 325.0000 mg | ORAL_TABLET | ORAL | Status: DC | PRN

## 2015-09-23 MED ORDER — CARVEDILOL 12.5 MG PO TABS
12.5000 mg | ORAL_TABLET | Freq: Two times a day (BID) | ORAL | Status: DC
  Administered 2015-09-23 – 2015-09-24 (×2): 12.5 mg via ORAL
  Filled 2015-09-23 (×2): qty 1

## 2015-09-23 MED ORDER — SODIUM CHLORIDE 0.9 % IV SOLN
INTRAVENOUS | Status: DC
  Administered 2015-09-23: 08:00:00 via INTRAVENOUS

## 2015-09-23 MED ORDER — VITAMIN B-12 1000 MCG PO TABS
1000.0000 ug | ORAL_TABLET | Freq: Every day | ORAL | Status: DC
  Administered 2015-09-23 – 2015-09-24 (×2): 1000 ug via ORAL
  Filled 2015-09-23 (×2): qty 1

## 2015-09-23 MED ORDER — HEPARIN (PORCINE) IN NACL 2-0.9 UNIT/ML-% IJ SOLN
INTRAMUSCULAR | Status: AC
  Filled 2015-09-23: qty 500

## 2015-09-23 MED ORDER — DIAZEPAM 5 MG PO TABS
10.0000 mg | ORAL_TABLET | Freq: Once | ORAL | Status: AC
  Administered 2015-09-23: 10 mg via ORAL

## 2015-09-23 MED ORDER — SODIUM CHLORIDE 0.9 % IV SOLN: INTRAVENOUS | Status: AC

## 2015-09-23 MED ORDER — SODIUM CHLORIDE 0.9 % IR SOLN
Status: AC
  Filled 2015-09-23: qty 2

## 2015-09-23 MED ORDER — SERTRALINE HCL 50 MG PO TABS
150.0000 mg | ORAL_TABLET | Freq: Every day | ORAL | Status: DC
  Administered 2015-09-23 – 2015-09-24 (×2): 150 mg via ORAL
  Filled 2015-09-23 (×2): qty 1

## 2015-09-23 MED ORDER — INSULIN GLARGINE 100 UNIT/ML ~~LOC~~ SOLN
60.0000 [IU] | Freq: Every day | SUBCUTANEOUS | Status: DC
  Administered 2015-09-23: 60 [IU] via SUBCUTANEOUS
  Filled 2015-09-23 (×2): qty 0.6

## 2015-09-23 MED ORDER — COLCHICINE 0.6 MG PO TABS
0.3000 mg | ORAL_TABLET | Freq: Three times a day (TID) | ORAL | Status: DC
  Administered 2015-09-23 – 2015-09-24 (×4): 0.3 mg via ORAL
  Filled 2015-09-23: qty 0.5
  Filled 2015-09-23: qty 1
  Filled 2015-09-23: qty 0.5
  Filled 2015-09-23: qty 1

## 2015-09-23 MED ORDER — MUPIROCIN 2 % EX OINT
TOPICAL_OINTMENT | CUTANEOUS | Status: AC
  Administered 2015-09-23: 1 via TOPICAL
  Filled 2015-09-23: qty 22

## 2015-09-23 MED ORDER — HYDRALAZINE HCL 25 MG PO TABS
37.5000 mg | ORAL_TABLET | Freq: Two times a day (BID) | ORAL | Status: DC
  Administered 2015-09-23 – 2015-09-24 (×4): 37.5 mg via ORAL
  Filled 2015-09-23 (×2): qty 2
  Filled 2015-09-23: qty 1.5
  Filled 2015-09-23: qty 2
  Filled 2015-09-23: qty 1.5

## 2015-09-23 MED ORDER — ATORVASTATIN CALCIUM 40 MG PO TABS
40.0000 mg | ORAL_TABLET | Freq: Every day | ORAL | Status: DC
  Administered 2015-09-23 – 2015-09-24 (×2): 40 mg via ORAL
  Filled 2015-09-23 (×2): qty 1

## 2015-09-23 MED ORDER — FENTANYL CITRATE (PF) 100 MCG/2ML IJ SOLN
INTRAMUSCULAR | Status: DC | PRN
  Administered 2015-09-23 (×3): 25 ug via INTRAVENOUS

## 2015-09-23 MED ORDER — CEFAZOLIN SODIUM 1-5 GM-% IV SOLN
1.0000 g | Freq: Four times a day (QID) | INTRAVENOUS | Status: AC
  Administered 2015-09-23 – 2015-09-24 (×3): 1 g via INTRAVENOUS
  Filled 2015-09-23 (×3): qty 50

## 2015-09-23 MED ORDER — DIAZEPAM 5 MG PO TABS
ORAL_TABLET | ORAL | Status: AC
  Administered 2015-09-23: 10 mg via ORAL
  Filled 2015-09-23: qty 2

## 2015-09-23 MED ORDER — CLOPIDOGREL BISULFATE 75 MG PO TABS
75.0000 mg | ORAL_TABLET | Freq: Every day | ORAL | Status: DC
  Administered 2015-09-24: 75 mg via ORAL
  Filled 2015-09-23: qty 1

## 2015-09-23 MED ORDER — FENTANYL CITRATE (PF) 100 MCG/2ML IJ SOLN
INTRAMUSCULAR | Status: AC
  Filled 2015-09-23: qty 2

## 2015-09-23 MED ORDER — GENTAMICIN SULFATE 40 MG/ML IJ SOLN: 80.0000 mg | INTRAMUSCULAR | Status: DC

## 2015-09-23 MED ORDER — MUPIROCIN 2 % EX OINT
1.0000 "application " | TOPICAL_OINTMENT | Freq: Once | CUTANEOUS | Status: AC
  Administered 2015-09-23: 1 via TOPICAL
  Filled 2015-09-23: qty 22

## 2015-09-23 MED ORDER — CEFAZOLIN SODIUM-DEXTROSE 2-3 GM-% IV SOLR
INTRAVENOUS | Status: DC | PRN
  Administered 2015-09-23: 2 g via INTRAVENOUS

## 2015-09-23 MED ORDER — LIDOCAINE HCL (PF) 1 % IJ SOLN
INTRAMUSCULAR | Status: DC | PRN
  Administered 2015-09-23: 35 mL via INTRADERMAL

## 2015-09-23 MED ORDER — ASPIRIN EC 81 MG PO TBEC
81.0000 mg | DELAYED_RELEASE_TABLET | Freq: Every day | ORAL | Status: DC
  Administered 2015-09-23 – 2015-09-24 (×2): 81 mg via ORAL
  Filled 2015-09-23 (×2): qty 1

## 2015-09-23 MED ORDER — IOHEXOL 350 MG/ML SOLN
INTRAVENOUS | Status: DC | PRN
  Administered 2015-09-23: 7 mL via INTRAVENOUS

## 2015-09-23 MED ORDER — ONDANSETRON HCL 4 MG/2ML IJ SOLN: 4.0000 mg | Freq: Four times a day (QID) | INTRAMUSCULAR | Status: DC | PRN

## 2015-09-23 MED ORDER — MIDAZOLAM HCL 5 MG/5ML IJ SOLN
INTRAMUSCULAR | Status: AC
  Filled 2015-09-23: qty 5

## 2015-09-23 SURGICAL SUPPLY — 20 items
BALLN ATTAIN 80 (BALLOONS) ×2
BALLN ATTAIN 80CM 6215 (BALLOONS) ×1
BALLOON ATTAIN 80 (BALLOONS) ×1 IMPLANT
CABLE SURGICAL S-101-97-12 (CABLE) ×3 IMPLANT
CATH ATTAIN SELECT 6238TEL (CATHETERS) ×3 IMPLANT
CATH CPS DIRECT 135 DS2C020 (CATHETERS) ×3 IMPLANT
ICD VIVA QUAD XT CRT-D DTBA1QQ (ICD Generator) ×3 IMPLANT
KIT ESSENTIALS PG (KITS) ×3 IMPLANT
LEAD ATTAIN PERFORMA S 4598-88 (Lead) ×3 IMPLANT
LEAD SPRINT QUAT SEC 6935M-62 (Lead) ×3 IMPLANT
PAD DEFIB LIFELINK (PAD) ×3 IMPLANT
SET INTRODUCER MICROPUNCT 5F (INTRODUCER) ×3 IMPLANT
SHEATH CLASSIC 9.5F (SHEATH) ×3 IMPLANT
SHEATH CLASSIC 9F (SHEATH) ×3 IMPLANT
SHEATH CLASSIC 9F 25CM (SHEATH) ×3 IMPLANT
SHIELD RADPAD SCOOP 12X17 (MISCELLANEOUS) ×3 IMPLANT
SLITTER UNIVERSAL DS2A003 (MISCELLANEOUS) ×3 IMPLANT
TRAY PACEMAKER INSERTION (PACKS) ×3 IMPLANT
WIRE ACUITY WHISPER EDS 4648 (WIRE) ×3 IMPLANT
WIRE HI TORQ VERSACORE-J 145CM (WIRE) ×3 IMPLANT

## 2015-09-23 NOTE — Progress Notes (Signed)
Family in to see. Patient sleeping. Waiting on tele bed assignment.

## 2015-09-23 NOTE — Progress Notes (Signed)
Eating turkey sandwich 

## 2015-09-23 NOTE — H&P (View-Only) (Signed)
Patient Care Team: [name], MD as PCP - General (Family Medicine) Dimitri Ped, RN as [place] [facility]   HPI  Rodney Farley is a 82 y.o. male Seen in follow-up for a pacemaker implanted in 2010 elsewhere. We saw him to establish in 2013. He has a history of complete heart block.     He has history of ischemic heart disease with prior MI. Myoview 2002 demonstrated ejection fraction of 49% with prior MI but no ischemia. Echocardiogram 3/16 ejection fraction was 35-40%  He underwent catheterization again 9/16 and multivessel PCI. Repeat echocardiogram 12/16 demonstrated persistent LV dysfunction with an EF of 25-30%.  He is referred for consideration of device generator revision.   he is feeling better although he still quite limited to less than 100 yards. He does not have nocturnal dyspnea. He has some peripheral edema.  Review of old data demonstrates our initial assessment of LV function (09/2008)  is recorded follows his pacemaker implantation which was accomplished January 2010.      Past Medical History  Diagnosis Date  . COPD (chronic obstructive pulmonary disease) (HCC)     Severe  . Hypercholesterolemia   . Depression   . Coronary artery disease     a. s/p MI in 1994/1995 while in [place] s/p questionable PCI. 03/2015: progression of disease, for staged PCI.  [name] Hypertension   . Atrioventricular block, complete ([place])     a. 2010 s/p pacemaker.  . Pacemaker     medtronic  . Obesity   . TIA (transient ischemic attack) X 3  . Neuropathy (HCC)     IN LOWER EXTREMITIES  . GERD (gastroesophageal reflux disease)   . DOE (dyspnea on exertion) 03/28/2015  . CKD (chronic kidney disease), stage IV ([place])   . Type II diabetes mellitus ([place])   . Diabetic peripheral neuropathy ([place])   . Chronic systolic CHF (congestive heart failure) ([place])     a. LVEF 35-40% by echo 09/2014.  . MI (myocardial infarction) ([place]) 1994; 1995  .  Anginal pain ([place])   . Sleep apnea     "sleeps w/humidifyer when he panics and gets short of breath" (04/08/2015)  . Anxiety   . Arm pain 05/08/2015    LEFT ARM    Past Surgical History  Procedure Laterality Date  . Tonsillectomy    . Cholecystectomy open  ~ 1977  . Insert / replace / remove pacemaker  07/2008    Complete heart block status post DDD with good function  . Hiatal hernia repair  1977  . Cardiac catheterization N/A 03/29/2015    Procedure: Right/Left Heart Cath and Coronary Angiography;  Surgeon: Quang M [name], MD;  Location: [facility];  Service: Cardiovascular;  Laterality: N/A;  . Cardiac catheterization  1995    "after my MI; put me on heart RX after cath"  . Cardiac catheterization N/A 04/09/2015    Procedure: Coronary Stent Intervention;  Surgeon: Dayron M [name], MD;  Location: [facility];  Service: Cardiovascular;  Laterality: N/A;    Current Outpatient Prescriptions  Medication Sig Dispense Refill  . aspirin EC 81 MG tablet Take 81 mg by mouth daily.    [name] atorvastatin (LIPITOR) 40 MG tablet Take 1 tablet (40 mg total) by mouth daily. 90 tablet 3  . carvedilol (COREG) 12.5 MG tablet Take 1 tablet (12.5 mg total) by mouth 2 (two) times daily with a meal. 60 tablet 11  .  clopidogrel (PLAVIX) 75 MG tablet Take 1 tablet (75 mg total) by mouth daily with breakfast. 30 tablet 6  . colchicine 0.6 MG tablet Take 0.5 tablets (0.3 mg total) by mouth 3 (three) times daily. 90 tablet 3  . furosemide (LASIX) 20 MG tablet Take a 1/2 tablet daily to equal a total of 30 mg or as directed (Patient taking differently: 20 mg. Take 1 and 1/2 tablets 30mg  daily) 30 tablet 1  . hydrALAZINE (APRESOLINE) 25 MG tablet Take 1.5 tablets (37.5 mg total) by mouth 2 (two) times daily. 90 tablet 11  . Insulin Pen Needle (B-D UF III MINI PEN NEEDLES) 31G X 5 MM MISC Use to test blood sugar 3 times daily. Dx code: 250.02 100 each 11  . isosorbide mononitrate (IMDUR) 30 MG 24 hr  tablet Take 1 tablet (30 mg total) by mouth daily. 30 tablet 6  . LANTUS SOLOSTAR 100 UNIT/ML Solostar Pen INJECT 60 UNITS UNDER THE SKIN ONCE DAILY 60 mL 0  . nitroGLYCERIN (NITROSTAT) 0.4 MG SL tablet Place 1 tablet (0.4 mg total) under the tongue every 5 (five) minutes x 3 doses as needed for chest pain. 25 tablet 3  . OVER THE COUNTER MEDICATION Place 1 drop into both eyes 3 (three) times daily. OTC Bausch-Lomb eye bath    . pantoprazole (PROTONIX) 40 MG tablet Take 1 tablet (40 mg total) by mouth daily. 30 tablet 6  . sertraline (ZOLOFT) 100 MG tablet Take 1.5 tablets (150 mg total) by mouth daily. 135 tablet 1  . vitamin B-12 (CYANOCOBALAMIN) 1000 MCG tablet Take 1,000 mcg by mouth daily.     No current facility-administered medications for this visit.    Allergies  Allergen Reactions  . Bee Venom Anaphylaxis  . Prednisone Other (See Comments)    hallucinations  . Zocor [Simvastatin] Nausea Only and Other (See Comments)    Headache with brand name only.  Can take the generic.    Review of Systems negative except from HPI and PMH  Physical Exam BP 112/74 mmHg  Pulse 89  Ht 5\' 7"  (1.702 m)  Wt 224 lb 12.8 oz (101.969 kg)  BMI 35.20 kg/m2 Well developed and well nourished in no acute distress HENT normal E scleral and icterus clear Neck Supple JVP flat; carotids brisk and full Clear to ausculation  Regular rate and rhythm, no murmurs gallops or rub Soft with active bowel sounds No clubbing cyanosis  Edema Alert and oriented, grossly normal motor and sensory function Skin Warm and Dry     Assessment and  Plan Complete heart block  Ischemic heart disease with prior MI  Pacemaker  Medtronic [name] device was interrogated.  The information was reviewed. No changes were made in the programming.     Hyperlipidemia  Renal insufficiency  Depression   He is much improved. His renal function is stable. Given his age and renal insufficiency, upgrade for only  ICD I don't think would be appropriate.  However, it is reasonable to consider CRT upgrade. Looking back, we do not have data related to his LV function prior to pacemaker insertion. However, within 2 months the LV function was 40-45%. It is unlikely that the LV function. That much 2 months of pacing; hence, I think it is likely that he had a preceding cardiomyopathy. This then much lower likelihood of improvement in LV systolic function with CRT upgrade. However, there is still some significant likelihood probably in the 40-50% range whereby LV function could improve and  functional status improved with resynchronization. He would like to pursue that. In this vein, and tendon, it becomes less unreasonable to advance an icd lead at the time of the procedure. he will consider this with his family.  We have reviewed the benefits and risks of generator replacement.  These include but are not limited to lead fracture and infection.  The patient understands, agrees and is willing to proceed.     More than 50% of 45 min was spent in counseling related to the above

## 2015-09-23 NOTE — Interval H&P Note (Signed)
ICD Criteria  Current LVEF:25%. Within 12 months prior to implant: Yes   Heart failure history: Yes, Class III  Cardiomyopathy history: Yes, Ischemic Cardiomyopathy.  Atrial Fibrillation/Atrial Flutter: No.  Ventricular tachycardia history: No.  Cardiac arrest history: No.  History of syndromes with risk of sudden death: No.  Previous ICD: No.  Current ICD indication: Primary  PPM indication: Yes. Pacing type: Ventricular. Greater than 40% RV pacing requirement anticipated. Indication: Complete Heart Block   Class I or II Bradycardia indication present: Yes  Beta Blocker therapy for 3 or more months: Yes, prescribed.   Ace Inhibitor/ARB therapy for 3 or more months: No, medical reason.  History and Physical Interval Note:  09/23/2015 9:05 AM  Rodney Farley  has presented today for surgery, with the diagnosis of acm  The various methods of treatment have been discussed with the patient and family. After consideration of risks, benefits and other options for treatment, the patient has consented to  Procedure(s): BiV ICD Insertion CRT-D (N/A) as a surgical intervention .  [name] history has been reviewed, patient examined, no change in status, stable for surgery.  I have reviewed the patient's chart and labs.  Questions were answered to the patient's satisfaction.     Virl Axe

## 2015-09-24 ENCOUNTER — Ambulatory Visit (HOSPITAL_COMMUNITY): Payer: 59

## 2015-09-24 ENCOUNTER — Encounter (HOSPITAL_COMMUNITY): Payer: Self-pay | Admitting: Internal Medicine

## 2015-09-24 DIAGNOSIS — I252 Old myocardial infarction: Secondary | ICD-10-CM | POA: Diagnosis not present

## 2015-09-24 DIAGNOSIS — I442 Atrioventricular block, complete: Secondary | ICD-10-CM | POA: Diagnosis not present

## 2015-09-24 DIAGNOSIS — I5022 Chronic systolic (congestive) heart failure: Secondary | ICD-10-CM | POA: Diagnosis not present

## 2015-09-24 DIAGNOSIS — I255 Ischemic cardiomyopathy: Secondary | ICD-10-CM | POA: Diagnosis not present

## 2015-09-24 DIAGNOSIS — I130 Hypertensive heart and chronic kidney disease with heart failure and stage 1 through stage 4 chronic kidney disease, or unspecified chronic kidney disease: Secondary | ICD-10-CM | POA: Diagnosis not present

## 2015-09-24 DIAGNOSIS — I259 Chronic ischemic heart disease, unspecified: Secondary | ICD-10-CM | POA: Diagnosis not present

## 2015-09-24 DIAGNOSIS — Z8673 Personal history of transient ischemic attack (TIA), and cerebral infarction without residual deficits: Secondary | ICD-10-CM | POA: Diagnosis not present

## 2015-09-24 DIAGNOSIS — E1142 Type 2 diabetes mellitus with diabetic polyneuropathy: Secondary | ICD-10-CM | POA: Diagnosis not present

## 2015-09-24 DIAGNOSIS — Z950 Presence of cardiac pacemaker: Secondary | ICD-10-CM | POA: Diagnosis not present

## 2015-09-24 DIAGNOSIS — J449 Chronic obstructive pulmonary disease, unspecified: Secondary | ICD-10-CM | POA: Diagnosis not present

## 2015-09-24 DIAGNOSIS — N184 Chronic kidney disease, stage 4 (severe): Secondary | ICD-10-CM | POA: Diagnosis not present

## 2015-09-24 LAB — GLUCOSE, CAPILLARY: GLUCOSE-CAPILLARY: 134 mg/dL — AB (ref 65–99)

## 2015-09-24 MED ORDER — FUROSEMIDE 20 MG PO TABS: 30.0000 mg | ORAL_TABLET | Freq: Every day | ORAL | Status: DC

## 2015-09-24 MED FILL — Gentamicin Sulfate Inj 40 MG/ML: INTRAMUSCULAR | Qty: 2 | Status: AC

## 2015-09-24 MED FILL — Heparin Sodium (Porcine) 2 Unit/ML in Sodium Chloride 0.9%: INTRAMUSCULAR | Qty: 500 | Status: AC

## 2015-09-24 MED FILL — Sodium Chloride Irrigation Soln 0.9%: Qty: 500 | Status: AC

## 2015-09-24 MED FILL — FUROSEMIDE 20 MG TABLET: 20 | 30 days supply | Qty: 45 | Fill #0

## 2015-09-24 NOTE — Discharge Summary (Signed)
ELECTROPHYSIOLOGY PROCEDURE DISCHARGE SUMMARY    Patient ID: Rodney Farley,  MRN: [id number], DOB/AGE: Jun 28, 1932 82 y.o.  Admit date: 09/23/2015 Discharge date: 09/24/2015  Primary Care Physician: [name], MD Primary Cardiologist/Electrophysiologist: Dr. Caryl Comes  Primary Discharge Diagnosis:  1. CHB 2. ICM  Secondary Discharge Diagnosis:  1. CAD     Multivessel PCI Sep 2016     On ASA/Plavix 2. HTN 3. COPD 4. HLD 4.CRI   Allergies  Allergen Reactions  . Bee Venom Anaphylaxis  . Prednisone Other (See Comments)    hallucinations  . Zocor [Simvastatin] Nausea Only and Other (See Comments)    Headache with brand name only.  Can take the generic.     Procedures This Admission:  1. Explant PPM generator 2.  Implantation of a CRT ICD on 09/23/15 by Dr Caryl Comes.  The patient received a Medtronic Viva XRT device, serial [id number] a  Medtronic [id number] high-voltage defibrillator lead, serial [id number], and a  Medtronic 4598 lead, serial [id number] LV lead.  DFT's were deferred at time of implant.  There were no immediate post procedure complications. 3.  CXR on  demonstrated no pneumothorax status post device implantation.  Good lead separation   Brief HPI: Rodney Farley is a 82 y.o. male was referred to electrophysiology in the outpatient setting for consideration of upgrade of his PPM to CRT- ICD implantation.  Past medical history includes CHB, CAD/ICM, HTN, HLD, COPD.  The patient has persistent LV dysfunction despite guideline directed therapy.  Risks, benefits, and alternatives to PPM explant and ICD implantation were reviewed with the patient who wished to proceed.   Hospital Course:  The patient was admitted and underwent implantation of a explant of PPM generator and implant/upgrade to CRT-ICD with details as outlined above. He was monitored on telemetry overnight which demonstrated P-synchronous/ AV  pacing    The   device was interrogated and found  to be functioning normally.  CXR was obtained and demonstrated no pneumothorax status post device implantation.  Wound care, arm mobility, and restrictions were reviewed with the patient.  The patient was examined by Dr. Caryl Comes and considered stable for discharge to home.   Pt feels better already   [name] discharge medications include an ACE-I (none with CRI) and beta blocker (carvedilol).   Physical Exam: Filed Vitals:   09/23/15 1730 09/23/15 1744 09/23/15 2110 09/24/15 0157  BP: 136/64 146/77 130/60 162/73  Pulse: 69 70 70 70  Temp:   98 F (36.7 C) 97.7 F (36.5 C)  TempSrc:   Oral Oral  Resp:   16 19  Height:      Weight:      SpO2:   97% 95%     GEN- The patient is well appearing, alert and oriented x 3 today.   HEENT: normocephalic, atraumatic; sclera clear, conjunctiva pink; hearing intact; oropharynx clear Lungs- Clear to ausculation bilaterally, normal work of breathing.  No wheezes, rales, rhonchi Heart- Regular rate and rhythm, no murmurs, rubs or gallops, PMI not laterally displaced GI- soft, non-tender, non-distended, bowel sounds present Extremities- no clubbing, cyanosis, or edema; DP/PT/radial pulses 2+ bilaterally MS- no significant deformity or atrophy Skin- warm and dry, no rash or lesion, left  * chest with  Hematoma  Psych- euthymic mood, full affect Neuro- no gross defecits  Labs:   Lab Results  Component Value Date   WBC 7.8 09/18/2015   HGB 11.6* 06/26/2015   HCT 39.1 09/18/2015   MCV 88  09/18/2015   PLT 207 09/18/2015    Recent Labs Lab 09/23/15 0826  NA 141  K 4.3  CL 105  CO2 26  BUN 46*  CREATININE 2.19*  CALCIUM 9.4  GLUCOSE 130*    Discharge Medications:    Medication List    ASK your doctor about these medications        aspirin EC 81 MG tablet  Take 81 mg by mouth daily.     atorvastatin 40 MG tablet  Commonly known as:  LIPITOR  Take 1 tablet (40 mg total) by mouth daily.     AVENOVA 0.01 % Soln  Apply 1  application topically 2 (two) times daily as needed.     bacitracin ophthalmic ointment  Place 1 application into both eyes at bedtime.     carvedilol 12.5 MG tablet  Commonly known as:  COREG  Take 1 tablet (12.5 mg total) by mouth 2 (two) times daily with a meal.     clopidogrel 75 MG tablet  Commonly known as:  PLAVIX  Take 1 tablet (75 mg total) by mouth daily with breakfast.     colchicine 0.6 MG tablet  Take 0.5 tablets (0.3 mg total) by mouth 3 (three) times daily.     furosemide 20 MG tablet  Commonly known as:  LASIX  Take a 1/2 tablet daily to equal a total of 30 mg or as directed     hydrALAZINE 25 MG tablet  Commonly known as:  APRESOLINE  Take 1.5 tablets (37.5 mg total) by mouth 2 (two) times daily.     Insulin Pen Needle 31G X 5 MM Misc  Commonly known as:  B-D UF III MINI PEN NEEDLES  Use to test blood sugar 3 times daily. Dx code: 250.02     isosorbide mononitrate 30 MG 24 hr tablet  Commonly known as:  IMDUR  Take 1 tablet (30 mg total) by mouth daily.     LANTUS SOLOSTAR 100 UNIT/ML Solostar Pen  Generic drug:  Insulin Glargine  INJECT 60 UNITS UNDER THE SKIN ONCE DAILY     nitroGLYCERIN 0.4 MG SL tablet  Commonly known as:  NITROSTAT  Place 1 tablet (0.4 mg total) under the tongue every 5 (five) minutes x 3 doses as needed for chest pain.     OVER THE COUNTER MEDICATION  Place 1 drop into both eyes 3 (three) times daily. OTC Bausch-Lomb eye bath     pantoprazole 40 MG tablet  Commonly known as:  PROTONIX  Take 1 tablet (40 mg total) by mouth daily.     sertraline 100 MG tablet  Commonly known as:  ZOLOFT  Take 1.5 tablets (150 mg total) by mouth daily.     vitamin B-12 1000 MCG tablet  Commonly known as:  CYANOCOBALAMIN  Take 1,000 mcg by mouth daily.        Disposition: Home      Follow-up Information    Follow up with [facility] On 10/03/2015.   Specialty:  Cardiology   Why:  9:30AM wound check   Contact  information:   [address] [place] [address] [phone]      Follow up with Virl Axe, MD On 12/24/2015.   Specialty:  Cardiology   Why:  2:00PM   Contact information:   [address] [address] [phone]       Duration of Discharge Encounter: Greater than 30 minutes including  physician time.  Signed, Tommye Standard, PA-C 09/24/2015 7:08 AM  Wound hematoma Will place elastoplast dressing and hold plavix  Will see again Friday   Discharge to home; need bmet at wound check  F/u PJ 6 weeks   F/u SK `12 weeks Echo 10 weeks

## 2015-09-24 NOTE — Op Note (Signed)
NAME:  Rodney Farley, Rodney Farley NO.:  [id number]  MEDICAL RECORD NO.:  [id number]  LOCATION:  [id number]                        FACILITY:  [facility]  PHYSICIAN:  Deboraha Sprang, MD, FACCDATE OF BIRTH:  1931-12-15  DATE OF PROCEDURE:  09/23/2015 DATE OF DISCHARGE:                              OPERATIVE REPORT   PREOPERATIVE DIAGNOSES: 1. Complete heart block. 2. 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  POSTOPERATIVE DIAGNOSES: 1. Complete heart block. 2. 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  PROCEDURES: 1. Pacemaker explantation. 2. Dual-chamber defibrillator implantation with left ventricular lead     placement.  DESCRIPTION OF PROCEDURE:  Following obtaining informed consent, the patient was brought to Electrophysiology Laboratory and placed on the fluoroscopic table in supine position.  After routine prep and drape, lidocaine was infiltrated in his subclavicular region at an angle distinct from the pacemaker incision.  We carried the incision down to layer of the prepectoral fascia and at this point, we obtained access to the left subclavian vein.  It turned out that there was narrowing at the innominate SVC junction.  We were able to pass a Wholey wire past this and then we put a long 9-French sheath in and deployed a Medtronic 6935 high-voltage defibrillator lead, serial [id number] in the right ventricular apex with a bipolar paced R-wave was 6.5 with a pace impedance of 598.  The threshold that was tested briefly was less than 3.0 V at 0.5 milliseconds.  Current of injury was brisk.  This lead was secured to the prepectoral fascia.  We then using the other wire, placed the St. Jude 135 sheath into the right atrium and with relatively easy access to the coronary sinus, took a 14 mL 50% contrast venogram, demonstrating a posterolateral branch as the only lateral vein.  We targeted this initially  without and then with one of the Medtronic triplet right angle access catheters.  We were able to place a wire up onto the lateral well and deployed the lead up onto the junction of the mid and distal third, having come up from below. During sheath removal, there was some retraction of the lead of about 1 to 1.5 cm.  The lead deployed was a Medtronic 4598 lead, serial [id number].  In its location, prior to sheath removal, the amplitude was 5 with a pace impedance of 772 with threshold less than 3 V at 0.5 milliseconds. Following the removal of the sheath, the previously implanted pacemaker was explanted.  The pocket was opened.  The device was freed up.  The atrial lead was removed and the 3 leads were then connected to Medtronic Viva XRT device, serial [id number].  Through the device, bipolar P- wave was 1.9 with a pace impedance of 399, a threshold of 1.25 at 0.4. There was no intrinsic ventricular rhythm.  The R-wave impedance was 475 with a threshold of 1 V at 0.4 and the LV impedance was 722 with a 1:2 configuration threshold of 1 V at 0.4 milliseconds.  It was noted with BiV pacing that there was significant shortening of the QRS duration.  A QLV was not measured.  The high-voltage impedance was 76 ohms.  With these acceptable parameters recorded, the ventricular lead having been capped, the leads in the pulse generator were placed into expanded pocket which had been copiously irrigated with antibiotic containing saline solution.  The leads in the pulse generator were secured to the prepectoral fascia and the wound was then closed in 2 layers in normal fashion.  The wound was washed, dried, and a benzoin, Steri-Strip dressing were applied.  Needle counts, sponge counts, and instrument counts were correct at the end of the procedure according to the staff, and the patient tolerated the procedure without apparent complication. I should note that we used CPAP.     Deboraha Sprang, MD, [facility]     SCK/MEDQ  D:  09/23/2015  T:  09/24/2015  Job:  [id number]

## 2015-09-24 NOTE — Progress Notes (Signed)
Pressure was applied to ICD site/hematoma, patient tolerated this well.  Pressure dressing was applied as per Dr. Olin Pia instruction.  Wound check is scheduled for Friday 09/27/15, 10:00AM  Tommye Standard, PA-C

## 2015-09-24 NOTE — Discharge Instructions (Signed)
° ° °  Supplemental Discharge Instructions for  Pacemaker/Defibrillator Patients  Activity No heavy lifting or vigorous activity with your left/right arm for 6 to 8 weeks.  Do not raise your left/right arm above your head for one week.  Gradually raise your affected arm as drawn below.             09/28/15                     09/29/15                    09/30/15                   10/01/15 __  NO DRIVING for   1 week  ; you may begin driving on   [id number]  .  WOUND CARE - Keep the wound area clean and dry.  Do not get this area wet for until cleared to do so at your wound check. No showers until cleared. - Do not remove the bandage  DO  NOT apply any creams, oils, or ointments to the wound area. - If you notice any drainage or discharge from the wound, any swelling or bruising at the site, or you develop a fever > 101? F after you are discharged home, call the office at once.  Special Instructions - You are still able to use cellular telephones; use the ear opposite the side where you have your pacemaker/defibrillator.  Avoid carrying your cellular phone near your device. - When traveling through airports, show security personnel your identification card to avoid being screened in the metal detectors.  Ask the security personnel to use the hand wand. - Avoid arc welding equipment, MRI testing (magnetic resonance imaging), TENS units (transcutaneous nerve stimulators).  Call the office for questions about other devices. - Avoid electrical appliances that are in poor condition or are not properly grounded. - Microwave ovens are safe to be near or to operate.  Additional information for defibrillator patients should your device go off: - If your device goes off ONCE and you feel fine afterward, notify the device clinic nurses. - If your device goes off ONCE and you do not feel well afterward, call 911. - If your device goes off TWICE, call 911. - If your device goes off THREE times in one day, call  911.  DO NOT DRIVE YOURSELF OR A FAMILY MEMBER WITH A DEFIBRILLATOR TO THE HOSPITAL--CALL 911.

## 2015-09-25 ENCOUNTER — Encounter (HOSPITAL_COMMUNITY): Payer: Self-pay | Admitting: Physician Assistant

## 2015-09-25 ENCOUNTER — Encounter (HOSPITAL_COMMUNITY): Payer: 59

## 2015-09-25 ENCOUNTER — Ambulatory Visit (INDEPENDENT_AMBULATORY_CARE_PROVIDER_SITE_OTHER): Payer: 59 | Admitting: Internal Medicine

## 2015-09-25 VITALS — BP 102/64 | HR 70 | Temp 98.3°F | Resp 16 | Ht 67.0 in | Wt 220.0 lb

## 2015-09-25 DIAGNOSIS — E1121 Type 2 diabetes mellitus with diabetic nephropathy: Secondary | ICD-10-CM | POA: Diagnosis not present

## 2015-09-25 DIAGNOSIS — I10 Essential (primary) hypertension: Secondary | ICD-10-CM

## 2015-09-25 DIAGNOSIS — E1142 Type 2 diabetes mellitus with diabetic polyneuropathy: Secondary | ICD-10-CM | POA: Diagnosis not present

## 2015-09-25 DIAGNOSIS — I9589 Other hypotension: Secondary | ICD-10-CM

## 2015-09-25 DIAGNOSIS — I5022 Chronic systolic (congestive) heart failure: Secondary | ICD-10-CM | POA: Diagnosis not present

## 2015-09-25 DIAGNOSIS — Z794 Long term (current) use of insulin: Secondary | ICD-10-CM

## 2015-09-25 NOTE — Patient Instructions (Addendum)
Change to 55units lantus Decrease hydralazine from 1.5 tabs twice a day to 1 tab twice a day

## 2015-09-26 NOTE — Progress Notes (Signed)
   Subjective:    Patient ID: Rodney Farley, male    DOB: April 20, 1932, 82 y.o.   MRN: [id number]  HPIf/u 72mo since last ov for DM check Last A1C below 7 Since then he has continued weight loss Wt Readings from Last 3 Encounters:  09/25/15 220 lb (99.791 kg)  09/24/15 218 lb 9.6 oz (99.156 kg)  08/27/15 224 lb 12.8 oz (101.969 kg)  was 233 3/16  See new pacemaker 3/13 In waiting room today he got dizzy and weak--BP was 81/38 P-73 Was given crackers and juice with some improvement of Symptoms and blood pressure increased to 102/64 Note that heat regular dose of Lantus 60 units at 10 PM and had only had a protein shake today before being seen in the early afternoon. In recent weeks he has had blood sugars below 100 during his early morning cardiac rehabilitation and has had to have crackers and juice  Note medications that may influence his blood pressure include: Coreg 12.5 mg twice a day Lasix 30 mg daily Hydralazine 37.5 mg twice a day Imdur 30 daily Although not on the list he is also taking Flomax .4 at bedtime which he credits with greatly helping sleep by preventing excessive nocturia  See labs  3/8--cr stable at 2.13  Review of Systems No new symptoms  No peripheral edema  No dyspnea on exertion as this symptom is improving with rehabilitation     Objective:   Physical Exam Repeat blood pressure by me in the exam room is 125/70 He is comfortable without symptoms Heart regular without murmur No carotid bruits abd benign No peripheral edema Alert and oriented without any confusion  Neurological intact  Able to walk without dizziness     Assessment & Plan:  #1 diabetes needing reduction in medication -Decrease Lantus to 55 units and follow morning blood sugars -If continues to be low at cardiac rehabilitation decrease to 50 units -His hemoglobin A1c could easily be accepted at 8 or below  #2 hypertension #3 left ventricular dysfunction-see echo 12/16 -His  wrist blood pressure monitor as compared to our regular arm cuff is found to be inaccurate -Given today's blood pressures I asked him to reduce hydralazine to a single tablet twice a day and continue to have daily blood pressures at cardiac rehabilitation to see further reduction is in order  We will repeat a hemoglobin A1c in about 2 months or this can be done at his lab follow-up with cardiology

## 2015-09-27 ENCOUNTER — Ambulatory Visit (INDEPENDENT_AMBULATORY_CARE_PROVIDER_SITE_OTHER): Payer: 59 | Admitting: *Deleted

## 2015-09-27 ENCOUNTER — Other Ambulatory Visit: Payer: 59

## 2015-09-27 ENCOUNTER — Other Ambulatory Visit: Payer: Self-pay | Admitting: Internal Medicine

## 2015-09-27 ENCOUNTER — Encounter (HOSPITAL_COMMUNITY): Payer: 59

## 2015-09-27 DIAGNOSIS — I442 Atrioventricular block, complete: Secondary | ICD-10-CM

## 2015-09-27 DIAGNOSIS — I255 Ischemic cardiomyopathy: Secondary | ICD-10-CM

## 2015-09-27 DIAGNOSIS — I2589 Other forms of chronic ischemic heart disease: Secondary | ICD-10-CM | POA: Diagnosis not present

## 2015-09-27 LAB — BASIC METABOLIC PANEL
BUN: 43 mg/dL — AB (ref 7–25)
CHLORIDE: 103 mmol/L (ref 98–110)
CO2: 25 mmol/L (ref 20–31)
Calcium: 9.4 mg/dL (ref 8.6–10.3)
Creat: 2.46 mg/dL — ABNORMAL HIGH (ref 0.70–1.11)
GLUCOSE: 105 mg/dL — AB (ref 65–99)
POTASSIUM: 4.7 mmol/L (ref 3.5–5.3)
SODIUM: 141 mmol/L (ref 135–146)

## 2015-09-27 MED FILL — TRUEplus LANCETS 30G MISC: 30 days supply | Qty: 100 | Fill #1

## 2015-09-27 MED FILL — hydrALAZINE HCL 25 MG TABS: 25 | 30 days supply | Qty: 90 | Fill #4

## 2015-09-27 NOTE — Progress Notes (Signed)
Patient presents to office for a wound check s/p CRT-D implant on 3/13. Pressure dressing removed. Dr.Klein evaluated wound. Device edges could be felt per Dr.Klein. Yellow/purple ecchymosis remains. Dr.Klein recommended that patient f/u w/ 10 day wound check as scheduled.  BMP ordered today.

## 2015-09-30 ENCOUNTER — Encounter: Payer: Self-pay | Admitting: Internal Medicine

## 2015-09-30 ENCOUNTER — Encounter (HOSPITAL_COMMUNITY): Payer: 59

## 2015-09-30 ENCOUNTER — Encounter: Payer: Self-pay | Admitting: Cardiology

## 2015-10-01 ENCOUNTER — Other Ambulatory Visit: Payer: Self-pay | Admitting: *Deleted

## 2015-10-01 DIAGNOSIS — N289 Disorder of kidney and ureter, unspecified: Secondary | ICD-10-CM

## 2015-10-02 ENCOUNTER — Other Ambulatory Visit: Payer: 59

## 2015-10-02 ENCOUNTER — Encounter (HOSPITAL_COMMUNITY): Payer: 59

## 2015-10-03 ENCOUNTER — Telehealth (HOSPITAL_COMMUNITY): Payer: Self-pay | Admitting: *Deleted

## 2015-10-03 ENCOUNTER — Encounter: Payer: Self-pay | Admitting: Internal Medicine

## 2015-10-03 ENCOUNTER — Ambulatory Visit (INDEPENDENT_AMBULATORY_CARE_PROVIDER_SITE_OTHER): Payer: 59 | Admitting: *Deleted

## 2015-10-03 DIAGNOSIS — I5022 Chronic systolic (congestive) heart failure: Secondary | ICD-10-CM

## 2015-10-03 DIAGNOSIS — I255 Ischemic cardiomyopathy: Secondary | ICD-10-CM | POA: Diagnosis not present

## 2015-10-03 DIAGNOSIS — Z95810 Presence of automatic (implantable) cardiac defibrillator: Secondary | ICD-10-CM | POA: Diagnosis not present

## 2015-10-03 DIAGNOSIS — I442 Atrioventricular block, complete: Secondary | ICD-10-CM

## 2015-10-03 LAB — CUP PACEART INCLINIC DEVICE CHECK
Brady Statistic AP VS Percent: 0.11 %
Brady Statistic RA Percent Paced: 89 %
Date Time Interrogation Session: 20170323113719
HIGH POWER IMPEDANCE MEASURED VALUE: 56 Ohm
Implantable Lead Implant Date: 20100112
Implantable Lead Implant Date: 20170313
Implantable Lead Location: 753858
Implantable Lead Location: 753859
Lead Channel Impedance Value: 399 Ohm
Lead Channel Impedance Value: 532 Ohm
Lead Channel Impedance Value: 608 Ohm
Lead Channel Impedance Value: 703 Ohm
Lead Channel Impedance Value: 722 Ohm
Lead Channel Impedance Value: 760 Ohm
Lead Channel Pacing Threshold Amplitude: 1.25 V
Lead Channel Pacing Threshold Pulse Width: 0.4 ms
Lead Channel Pacing Threshold Pulse Width: 0.4 ms
Lead Channel Pacing Threshold Pulse Width: 0.4 ms
Lead Channel Sensing Intrinsic Amplitude: 1.625 mV
Lead Channel Sensing Intrinsic Amplitude: 1.625 mV
Lead Channel Setting Pacing Amplitude: 2.5 V
Lead Channel Setting Pacing Amplitude: 3.5 V
Lead Channel Setting Pacing Pulse Width: 0.4 ms
Lead Channel Setting Sensing Sensitivity: 0.3 mV
MDC IDC LEAD IMPLANT DT: 20170313
MDC IDC LEAD LOCATION: 753860
MDC IDC LEAD MODEL: 4598
MDC IDC MSMT BATTERY REMAINING LONGEVITY: 72 mo
MDC IDC MSMT BATTERY VOLTAGE: 3.07 V
MDC IDC MSMT LEADCHNL LV IMPEDANCE VALUE: 361 Ohm
MDC IDC MSMT LEADCHNL LV IMPEDANCE VALUE: 361 Ohm
MDC IDC MSMT LEADCHNL LV IMPEDANCE VALUE: 399 Ohm
MDC IDC MSMT LEADCHNL LV IMPEDANCE VALUE: 456 Ohm
MDC IDC MSMT LEADCHNL LV IMPEDANCE VALUE: 589 Ohm
MDC IDC MSMT LEADCHNL LV PACING THRESHOLD AMPLITUDE: 1.75 V
MDC IDC MSMT LEADCHNL RA IMPEDANCE VALUE: 399 Ohm
MDC IDC MSMT LEADCHNL RV IMPEDANCE VALUE: 304 Ohm
MDC IDC MSMT LEADCHNL RV PACING THRESHOLD AMPLITUDE: 1 V
MDC IDC SET LEADCHNL LV PACING PULSEWIDTH: 0.4 ms
MDC IDC SET LEADCHNL RV PACING AMPLITUDE: 3.5 V
MDC IDC STAT BRADY AP VP PERCENT: 88.89 %
MDC IDC STAT BRADY AS VP PERCENT: 10.96 %
MDC IDC STAT BRADY AS VS PERCENT: 0.04 %
MDC IDC STAT BRADY RV PERCENT PACED: 99.73 %

## 2015-10-03 MED ORDER — HYDRALAZINE HCL 25 MG PO TABS: 25.0000 mg | ORAL_TABLET | Freq: Two times a day (BID) | ORAL | Status: DC

## 2015-10-03 NOTE — Progress Notes (Signed)
Wound check appointment. Dermabond removed. Wound without redness. Edema and slight bruising present, pocket firm to touch, GT checked. Will continue to monitor at 2wk visit. Pt aware to call with signs and symptoms of infection. Incision edges approximated, wound well healed. Normal device function. Thresholds, sensing, and impedances consistent with implant measurements. Device programmed at 3.5V for extra safety margin until 3 month visit. Histogram distribution appropriate for patient and level of activity. No mode switches or ventricular arrhythmias noted. Patient educated about wound care, arm mobility, lifting restrictions, shock plan. ROV in 3 months with implanting physician.

## 2015-10-04 ENCOUNTER — Other Ambulatory Visit (INDEPENDENT_AMBULATORY_CARE_PROVIDER_SITE_OTHER): Payer: 59

## 2015-10-04 ENCOUNTER — Telehealth: Payer: Self-pay | Admitting: *Deleted

## 2015-10-04 ENCOUNTER — Encounter (HOSPITAL_COMMUNITY): Payer: 59

## 2015-10-04 DIAGNOSIS — N289 Disorder of kidney and ureter, unspecified: Secondary | ICD-10-CM

## 2015-10-04 LAB — BASIC METABOLIC PANEL
BUN: 42 mg/dL — AB (ref 7–25)
CALCIUM: 9.2 mg/dL (ref 8.6–10.3)
CO2: 27 mmol/L (ref 20–31)
CREATININE: 1.94 mg/dL — AB (ref 0.70–1.11)
Chloride: 103 mmol/L (ref 98–110)
GLUCOSE: 96 mg/dL (ref 65–99)
POTASSIUM: 5.1 mmol/L (ref 3.5–5.3)
Sodium: 141 mmol/L (ref 135–146)

## 2015-10-04 NOTE — Telephone Encounter (Signed)
Reviewed previous lab results from 09/27/15.  Patient was to have a BMET drawn this week.  Called patient to advised that he should have a BMET and he is agreeable to a lab appointment today at 12:00pm.  Advised that these results will help Dr. Caryl Comes determine if he should resume his furosemide.  Patient verbalizes understanding and denies additional questions at this time.

## 2015-10-07 ENCOUNTER — Encounter (HOSPITAL_COMMUNITY): Payer: 59

## 2015-10-07 MED FILL — UNIFINE PENTIPS 31GX3/16: 31G X 5 MM | 30 days supply | Qty: 100 | Fill #0

## 2015-10-09 ENCOUNTER — Encounter (HOSPITAL_COMMUNITY): Payer: 59

## 2015-10-10 MED FILL — CLOPIDOGREL 75 MG TABLET: 75 | 30 days supply | Qty: 30 | Fill #5

## 2015-10-11 ENCOUNTER — Encounter (HOSPITAL_COMMUNITY): Payer: 59

## 2015-10-14 ENCOUNTER — Encounter (HOSPITAL_COMMUNITY): Admission: RE | Admit: 2015-10-14 | Payer: 59 | Source: Ambulatory Visit

## 2015-10-14 ENCOUNTER — Other Ambulatory Visit: Payer: Self-pay

## 2015-10-14 VITALS — BP 104/60 | HR 75 | Resp 16 | Ht 67.0 in | Wt 225.6 lb

## 2015-10-14 DIAGNOSIS — Z794 Long term (current) use of insulin: Principal | ICD-10-CM

## 2015-10-14 DIAGNOSIS — E1165 Type 2 diabetes mellitus with hyperglycemia: Principal | ICD-10-CM

## 2015-10-14 DIAGNOSIS — E1122 Type 2 diabetes mellitus with diabetic chronic kidney disease: Secondary | ICD-10-CM

## 2015-10-14 DIAGNOSIS — IMO0002 Reserved for concepts with insufficient information to code with codable children: Secondary | ICD-10-CM

## 2015-10-14 DIAGNOSIS — N183 Chronic kidney disease, stage 3 (moderate): Principal | ICD-10-CM

## 2015-10-14 MED FILL — COLCHICINE 0.6 MG TABLET: 0.6 | 60 days supply | Qty: 90 | Fill #1

## 2015-10-14 NOTE — Patient Instructions (Addendum)
1. Plan to eat 45-60 GM (3-4) servings of carbohydrate a meal and 15 GM for snacks. Plan to have protein with meals and snacks 2. Plan to check blood sugar 1-2 times a day either fasting or 1 -2hrs after a meal.  Goals of 80-130 fasting and less than 180 after meals 3. Plan to exercise at cardiac rehab 3 days a week  4. Plan to use cane when going out 5. Plan to pick up blood pressure monitor at the outpatient pharmacy  6. Plan to make appointment to see Dr. [name] in May 7. Plan to complete EMMI programs by 12/12/15 8. Plan to return to [facility] on 12/17/15 at 1:30PM

## 2015-10-14 NOTE — Patient Outreach (Signed)
[facility]) Care Management   10/14/2015  Rodney Farley 1932-03-31 [id number]  Rodney Farley is an 82 y.o. male.   Member seen for follow up office visit for [facility] program for self management of Type 2 diabetes  Subjective: Member states that he is recovering from having his pacemake/defibrillator inserted last month.  States he was to restart cardiac rehab today but he canceled today's visit as he had a few sniffles.  States he is checking his blood sugars once or twice a day which range from 88-178.  States he is weighting daily and his weight is ranges from 219-222 most days.  States his blood pressure goes up and down.  States he has a wrist monitor that he is using to check his blood pressures  Objective:   Review of Systems  Musculoskeletal: Positive for falls.    Physical Exam  Neurological: Coordination abnormal.  Unsteady gait  Skin:      Today's Vitals   10/14/15 1305 10/14/15 1317  BP: 104/60   Pulse: 75   Resp: 16   Height: 1.702 m (5\' 7" )   Weight: 225 lb 9.6 oz (102.331 kg)   SpO2: 95%   PainSc: 2  2    Encounter Medications:   Outpatient Encounter Prescriptions as of 10/14/2015  Medication Sig Note  . aspirin EC 81 MG tablet Take 81 mg by mouth daily.   [name] atorvastatin (LIPITOR) 40 MG tablet Take 1 tablet (40 mg total) by mouth daily.   . bacitracin ophthalmic ointment Place 1 application into both eyes at bedtime. Apply to eyelids 09/18/2015: Received from: External Pharmacy Received Sig:   . carvedilol (COREG) 12.5 MG tablet Take 1 tablet (12.5 mg total) by mouth 2 (two) times daily with a meal.   . clopidogrel (PLAVIX) 75 MG tablet Take 1 tablet (75 mg total) by mouth daily with breakfast.   . colchicine 0.6 MG tablet Take 0.5 tablets (0.3 mg total) by mouth 3 (three) times daily.   . Eyelid Cleansers (AVENOVA) 0.01 % SOLN Apply 1 application topically 2 (two) times daily as needed.   . hydrALAZINE (APRESOLINE) 25 MG tablet  Take 1 tablet (25 mg total) by mouth 2 (two) times daily.   . isosorbide mononitrate (IMDUR) 30 MG 24 hr tablet Take 1 tablet (30 mg total) by mouth daily.   [name] LANTUS SOLOSTAR 100 UNIT/ML Solostar Pen INJECT 60 UNITS UNDER THE SKIN ONCE DAILY   . nitroGLYCERIN (NITROSTAT) 0.4 MG SL tablet Place 1 tablet (0.4 mg total) under the tongue every 5 (five) minutes x 3 doses as needed for chest pain.   [name] OVER THE COUNTER MEDICATION Place 1 drop into both eyes 3 (three) times daily. OTC Bausch-Lomb eye bath   . pantoprazole (PROTONIX) 40 MG tablet Take 1 tablet (40 mg total) by mouth daily.   . sertraline (ZOLOFT) 100 MG tablet Take 1.5 tablets (150 mg total) by mouth daily.   . tamsulosin (FLOMAX) 0.4 MG CAPS capsule Take 0.4 mg by mouth daily after supper.   [name] UNIFINE PENTIPS 31G X 5 MM MISC USE AS DIRECTED   . vitamin B-12 (CYANOCOBALAMIN) 1000 MCG tablet Take 1,000 mcg by mouth daily.   . furosemide (LASIX) 20 MG tablet Take 1.5 tablets (30 mg total) by mouth daily. Take 1 and 1/2 tablets 30mg  daily (Patient not taking: Reported on 10/14/2015)    No facility-administered encounter medications on file as of 10/14/2015.    Functional Status:  In your present state of health, do you have any difficulty performing the following activities: 10/14/2015 09/24/2015  Hearing? Tempie Donning  Vision? Y Y  Difficulty concentrating or making decisions? N N  Walking or climbing stairs? Y N  Dressing or bathing? N N  Doing errands, shopping? N N    Fall/Depression Screening:    PHQ 2/9 Scores 10/14/2015 09/25/2015 07/08/2015 06/26/2015 06/24/2015 05/31/2015 04/19/2015  PHQ - 2 Score 1 0 0 0 1 4 4   PHQ- 9 Score - - - - - 11 11    Assessment:    Member seen for follow up office visit for Link to Wellness program for self management of Type 2 diabetes.  Member is meeting diabetes self management goal of hemoglobin A1C of 7.5% or below with last result of 6.9%.  Member reports trying to follow a low sodium diabetes diet and he  is drinking lower CHO nutritional supplements.  Member is adherent with daily weights and verbalizes when to call MD for increased weights. Incision from pacemaker/ICD insertion healing without s/sx of infection.   Member is to pick up new blood pressure monitor from the Outpatient pharmacy today.  Member is to restart cardiac rehab and will be exercising 3 times a week.  Member with unsteady gait and he is not using his cane.  Plan:   Plan to eat 45-60 GM (3-4) servings of carbohydrate a meal and 15 GM for snacks. Plan to have protein with meals and snacks Plan to check blood sugar 1-2 times a day either fasting or 1 -2hrs after a meal.  Goals of 80-130 fasting and less than 180 after meals Plan to exercise at cardiac rehab 3 days a week Plan to use cane when going out Plan to pick up blood pressure monitor at the outpatient pharmacy  Plan to make appointment to see Dr. [name] in May Plan to complete EMMI programs by 12/12/15 Plan to return to [facility] on 12/17/15 at 1:30PM  [facility] CM Care Plan Problem One        Most Recent Value   Care Plan Problem One  Potential for elevated blood sugars related to dx of Type 2 DM   Role Documenting the Problem One  Care Management Coordinator   Care Plan for Problem One  Active   [facility] Long Term Goal (31-90 days)  Member will maintain hemoglobin A1C at or below 7.5 for the next 90 days   [facility] Long Term Goal Start Date  10/14/15   Interventions for Problem One Long Term Goal  Reinforced CHO counting and portion control, Instructed to try to have protein with meals and snacks, Given voucher to get new B/P monitor at the [facility],  Instructed on s/sx of hypoglycemia and actions to take, Given handout on hypoglycemia    [facility] CM Care Plan Problem Two        Most Recent Value   Care Plan Problem Two  Knowledge deficit related to impaired mobility and at risk for falls   Role Documenting the Problem Two  Care Management [place] for Problem Two  Active   Interventions for Problem Two Long Term Goal   Instructed to start using his cane when he goes out and when he feels unsteady, Instructed to get up slowly and take his time before moving, Discussed getting a emergency alert system to use when he is alone at home   [facility] Long Term Goal (31-90) days  Member  will verbalize and demonstrate understanding of safety precautions for the next 90 days   [facility] Long Term Goal Start Date  10/14/15    Esmeralda Garter RN, [facility] Care Management Coordinator-Link to [place] Management [phone]

## 2015-10-16 ENCOUNTER — Encounter (HOSPITAL_COMMUNITY): Payer: 59

## 2015-10-17 ENCOUNTER — Ambulatory Visit (INDEPENDENT_AMBULATORY_CARE_PROVIDER_SITE_OTHER): Payer: 59 | Admitting: *Deleted

## 2015-10-17 DIAGNOSIS — Z95810 Presence of automatic (implantable) cardiac defibrillator: Secondary | ICD-10-CM

## 2015-10-17 NOTE — Progress Notes (Signed)
Patient presents to device clinic today for defib pocket re-check. Edema still noted, although improved. Pocket softer upon palpation, no redness, drainage, fever or chills. Patient instructed to call if symptoms develop. ROV with GT 6/13.

## 2015-10-18 ENCOUNTER — Encounter (HOSPITAL_COMMUNITY): Payer: 59

## 2015-10-21 ENCOUNTER — Encounter (HOSPITAL_COMMUNITY): Payer: 59

## 2015-10-22 ENCOUNTER — Telehealth (HOSPITAL_COMMUNITY): Payer: Self-pay | Admitting: *Deleted

## 2015-10-23 ENCOUNTER — Encounter (HOSPITAL_COMMUNITY): Payer: 59

## 2015-10-25 ENCOUNTER — Encounter (HOSPITAL_COMMUNITY): Payer: 59

## 2015-10-27 ENCOUNTER — Encounter: Payer: Self-pay | Admitting: Cardiology

## 2015-10-28 MED FILL — SERTRALINE HCL 100 MG TAB: 100 | 90 days supply | Qty: 135 | Fill #0

## 2015-10-28 MED FILL — ISOSORBIDE MN ER 30 MG TAB: 30 | 30 days supply | Qty: 30 | Fill #6

## 2015-10-28 MED FILL — CARVEDILOL 12.5 MG TABLET: 12.5 | 90 days supply | Qty: 180 | Fill #1

## 2015-10-28 MED FILL — PANTOPRAZOLE SOD DR 40 MG T: 40 | 30 days supply | Qty: 30 | Fill #2

## 2015-10-30 ENCOUNTER — Telehealth: Payer: Self-pay

## 2015-10-30 NOTE — Telephone Encounter (Signed)
Pts wife was calling in regards to his Cpap machine. She has not received or heard anything else about it.  Please advise  [phone]

## 2015-11-01 ENCOUNTER — Other Ambulatory Visit: Payer: Self-pay

## 2015-11-01 DIAGNOSIS — I255 Ischemic cardiomyopathy: Secondary | ICD-10-CM

## 2015-11-01 DIAGNOSIS — I5022 Chronic systolic (congestive) heart failure: Secondary | ICD-10-CM

## 2015-11-01 DIAGNOSIS — Z950 Presence of cardiac pacemaker: Secondary | ICD-10-CM

## 2015-11-01 NOTE — Telephone Encounter (Signed)
Spoke to patient Dr.Jordan advised ok to have echo 3 month after pacemaker.Scheduler will call back to schedule echo in June.

## 2015-11-04 ENCOUNTER — Telehealth: Payer: Self-pay | Admitting: Cardiology

## 2015-11-04 ENCOUNTER — Telehealth: Payer: Self-pay | Admitting: Internal Medicine

## 2015-11-04 DIAGNOSIS — I442 Atrioventricular block, complete: Secondary | ICD-10-CM

## 2015-11-04 DIAGNOSIS — I5022 Chronic systolic (congestive) heart failure: Secondary | ICD-10-CM

## 2015-11-04 NOTE — Telephone Encounter (Signed)
Pt's wife states Dr Caryl Comes mentioned after device insertion in March 2017 that pt may benefit from CPAP/BiPAP.  Pt's wife states 3-4 years ago pt attempted to undergo sleep study but was unable to satisfactorily complete sleep study. Pt's wife states she discussed CPAP/BiPAP with pt's regular doctor, they suggested she contact Dr Caryl Comes for follow up since he is the one that mentioned it.  Pt's wife advised I will forward to Dr Caryl Comes for review

## 2015-11-04 NOTE — Telephone Encounter (Signed)
New Message  Pt wife calling- stated that a BPAP was discussed- pt wife following up. Please call back and discuss.

## 2015-11-04 NOTE — Telephone Encounter (Signed)
Called the patient and left a voicemail to call back and schedule his echo in June.

## 2015-11-04 NOTE — Telephone Encounter (Signed)
I spoke with patient's wife with pt's verbal permission

## 2015-11-04 NOTE — Telephone Encounter (Signed)
SPoke with pt's wife, advised Dr. Caryl Comes may have to order cpap. She agreed and will call him to discuss.

## 2015-11-05 ENCOUNTER — Other Ambulatory Visit: Payer: Self-pay | Admitting: Internal Medicine

## 2015-11-05 ENCOUNTER — Telehealth: Payer: Self-pay | Admitting: Cardiology

## 2015-11-05 ENCOUNTER — Encounter: Payer: Self-pay | Admitting: Internal Medicine

## 2015-11-05 ENCOUNTER — Other Ambulatory Visit: Payer: Self-pay | Admitting: Nurse Practitioner

## 2015-11-05 DIAGNOSIS — H18411 Arcus senilis, right eye: Secondary | ICD-10-CM | POA: Diagnosis not present

## 2015-11-05 DIAGNOSIS — H2511 Age-related nuclear cataract, right eye: Secondary | ICD-10-CM | POA: Diagnosis not present

## 2015-11-05 DIAGNOSIS — H2512 Age-related nuclear cataract, left eye: Secondary | ICD-10-CM | POA: Diagnosis not present

## 2015-11-05 DIAGNOSIS — H02839 Dermatochalasis of unspecified eye, unspecified eyelid: Secondary | ICD-10-CM | POA: Diagnosis not present

## 2015-11-05 DIAGNOSIS — H18412 Arcus senilis, left eye: Secondary | ICD-10-CM | POA: Diagnosis not present

## 2015-11-05 MED FILL — CLOPIDOGREL 75 MG TABLET: 75 | 30 days supply | Qty: 30 | Fill #6

## 2015-11-05 MED FILL — NITROGLYCERIN 0.4 MG TAB SL: 0.4 | 30 days supply | Qty: 25 | Fill #0

## 2015-11-05 MED FILL — BESIVANCE 0.6% SUSP: 0.6 | 30 days supply | Qty: 5 | Fill #0

## 2015-11-05 MED FILL — DUREZOL 0.05% EYE DROPS: 0.05 | 30 days supply | Qty: 5 | Fill #0

## 2015-11-05 NOTE — Telephone Encounter (Signed)
We should go ahead and order the sleep study.; If he does not think he can do it wraps we can refer him to pulmonary/ sleep

## 2015-11-05 NOTE — Telephone Encounter (Signed)
Called pt in regards to the e mail that he sent. Pt wife is going to have pt call back later.

## 2015-11-05 NOTE — Telephone Encounter (Signed)
Pt's wife advised Dr Caryl Comes will order sleep study, pt's wife states pt is willing to try sleep study again.  Pt's wife states pt will be out of the country from April 28-May 12,2017 and would like to have sleep study when he returns.  Per [place]--pt's wife advised Romelle Starcher will schedule sleep study, pt should receive packet from [facility] with instructions.

## 2015-11-07 NOTE — Telephone Encounter (Signed)
Dr.Jordan cleared patient for upcoming cataract surgery.Clearance form faxed to [facility] at fax # [phone]

## 2015-11-08 ENCOUNTER — Other Ambulatory Visit: Payer: Self-pay | Admitting: Internal Medicine

## 2015-11-08 NOTE — Telephone Encounter (Signed)
PATIENT'S WIFE (RAYLENE) STATES HER HUSBAND NEEDS A REFILL ON HIS INSULIN PENS TODAY. SHE SAID HE IS GOING OUT OF THE COUNTRY AND HE WILL RUN OUT. SHE SAID HE NEEDS AT LEAST 2 PENS. SHE WOULD LIKE DR. DOOLITTLE TO KNOW AS SOON AS POSSIBLE. SHE SAID THE PHARMACY HAS ALSO FAXED OVER A REQUEST. BEST PHONE [phone] (HOME) PHARMACY CHOICE IS  OUT PATIENT PHARMACY.  [place]

## 2015-11-08 NOTE — Telephone Encounter (Signed)
Ok to change

## 2015-11-08 NOTE — Telephone Encounter (Signed)
Spoke w/ pt and informed him that he doesn't have to take his home monitor w/ him on his trip. Pt verbalized understanding.

## 2015-11-10 NOTE — Telephone Encounter (Signed)
This request should be handled immediately rather than waitnig for a response as insulin is for the most part available--just the mechanisms for admin are difficult because of the insurers So say yes to whatever he needs

## 2015-11-11 MED FILL — hydrALAZINE HCL 25 MG TABS: 25 | 30 days supply | Qty: 90 | Fill #5

## 2015-11-11 MED FILL — LANTUS SOLOSTAR 100 UNITS/M: 100 | 75 days supply | Qty: 45 | Fill #0 | Status: TO

## 2015-11-11 MED FILL — ATORVASTATIN 40 MG TABLET: 40 | 90 days supply | Qty: 90 | Fill #2

## 2015-11-11 NOTE — Telephone Encounter (Signed)
Called and [facility] for pt to notify that a RF was sent in.

## 2015-11-12 ENCOUNTER — Telehealth: Payer: Self-pay | Admitting: Internal Medicine

## 2015-11-12 MED FILL — PROLENSA 0.07% EYE DROPS: 0.07 | 60 days supply | Qty: 3 | Fill #0

## 2015-11-12 NOTE — Telephone Encounter (Signed)
New Message:   Pt need a new C Pap,please order for him.

## 2015-11-12 NOTE — Telephone Encounter (Signed)
Will forward to [facility] for review.

## 2015-11-15 NOTE — Telephone Encounter (Signed)
Left message for patient/spouse to call me back

## 2015-11-18 DIAGNOSIS — H25012 Cortical age-related cataract, left eye: Secondary | ICD-10-CM | POA: Diagnosis not present

## 2015-11-18 DIAGNOSIS — H25812 Combined forms of age-related cataract, left eye: Secondary | ICD-10-CM | POA: Diagnosis not present

## 2015-11-18 DIAGNOSIS — H2512 Age-related nuclear cataract, left eye: Secondary | ICD-10-CM | POA: Diagnosis not present

## 2015-11-18 NOTE — Telephone Encounter (Signed)
Patient and spouse are both aware that getting a CPAP will be based on the results that come back from the sleep study.

## 2015-11-19 DIAGNOSIS — H2511 Age-related nuclear cataract, right eye: Secondary | ICD-10-CM | POA: Diagnosis not present

## 2015-11-26 ENCOUNTER — Encounter: Payer: Self-pay | Admitting: Cardiology

## 2015-11-28 ENCOUNTER — Ambulatory Visit (INDEPENDENT_AMBULATORY_CARE_PROVIDER_SITE_OTHER): Payer: 59 | Admitting: Cardiology

## 2015-11-28 ENCOUNTER — Encounter: Payer: Self-pay | Admitting: Cardiology

## 2015-11-28 VITALS — BP 132/68 | HR 65 | Ht 68.0 in | Wt 225.0 lb

## 2015-11-28 DIAGNOSIS — I442 Atrioventricular block, complete: Secondary | ICD-10-CM | POA: Diagnosis not present

## 2015-11-28 DIAGNOSIS — I2510 Atherosclerotic heart disease of native coronary artery without angina pectoris: Secondary | ICD-10-CM

## 2015-11-28 DIAGNOSIS — N184 Chronic kidney disease, stage 4 (severe): Secondary | ICD-10-CM

## 2015-11-28 DIAGNOSIS — I5022 Chronic systolic (congestive) heart failure: Secondary | ICD-10-CM | POA: Diagnosis not present

## 2015-11-28 MED ORDER — PANTOPRAZOLE SODIUM 40 MG PO TBEC: 40.0000 mg | DELAYED_RELEASE_TABLET | Freq: Every day | ORAL | Status: DC

## 2015-11-28 MED ORDER — ISOSORBIDE MONONITRATE ER 30 MG PO TB24: 30.0000 mg | ORAL_TABLET | Freq: Every day | ORAL | Status: DC

## 2015-11-28 MED FILL — PANTOPRAZOLE SOD DR 40 MG T: 40 | 30 days supply | Qty: 30 | Fill #0

## 2015-11-28 MED FILL — ISOSORBIDE MN ER 30 MG TAB: 30 | 30 days supply | Qty: 30 | Fill #0

## 2015-11-28 NOTE — Progress Notes (Signed)
CARDIOLOGY OFFICE NOTE  Date:  11/28/2015    Rodney Farley Date of Birth: 1932/07/11 Medical Record [id number]  PCP:  [name], MD  Cardiologist:  [name]    Chief Complaint  Patient presents with  . Congestive Heart Failure    History of Present Illness: Rodney Farley is a 82 y.o. male who is seen for follow up CAD and CHF.   He has a history of CAD (s/p MI in 1994/1995 while in [place] s/p questionable PCI, , CHB 2010 s/p pacemaker, CKD stage IV, DM, HTN, HLD, remote tobacco abuse, COPD, moderate sleep apnea, and chronic systolic CHF.    Last year he had progression of DOE, chest discomfort and chest pounding, with severe limitation on exertion. 2D Echo 09/2014 showed EF 35-40% with +WMA, grade 1 DD, mild MR. PFTs looked remarkably good. He underwent cardiac cath on 03/28/15 and Cr was 2.14 (stable compared to prior outpatient 2.15). LE duplex negative for DVT. Cath showed severe 3V CAD - the mid LAD, second diagonal, and third OM branches appeared suitable for PCI +/- distal LCx. The other option would be CABG. After discussion concerning risk and benefits hewas treated with PCI.   Patient underwent Multivessel PCI including stenting of the mid LAD, second diagonal, and third OM with DES on 04/09/15.  Distal 70% circumflex lesion will be treated medically.He was started on aspirin and Plavix. Hydralazine was added for afterload reduction and better blood pressure control.   After his procedure he  had a marked improvement in his symptoms of dyspnea.  No chest pain. Breathing is much better. He had repeat Echo that still showed depressed EF 25-30% despite revascularization. He underwent upgrade of PPM to a BiV ICD device by Dr. Caryl Comes on 09/23/15. He had no complications. He continues to do very well. Mild swelling in his ankles. Weight is stable. No DOE or chest pain. Had cataract surgery 2 weeks ago with improved vision.   Past Medical History  Diagnosis Date  .  COPD (chronic obstructive pulmonary disease) (HCC)     Severe  . Hypercholesterolemia   . Depression   . Coronary artery disease     a. s/p MI in 1994/1995 while in [place] s/p questionable PCI. 03/2015: progression of disease, for staged PCI.  [name] Hypertension   . Atrioventricular block, complete ([place])     a. 2010 s/p pacemaker.  . Pacemaker     medtronic>>> MDT ICD 09/23/15  . Obesity   . TIA (transient ischemic attack) X 3  . Neuropathy (HCC)     IN LOWER EXTREMITIES  . GERD (gastroesophageal reflux disease)   . DOE (dyspnea on exertion) 03/28/2015  . CKD (chronic kidney disease), stage IV ([place])   . Type II diabetes mellitus ([place])   . Diabetic peripheral neuropathy ([place])   . Chronic systolic CHF (congestive heart failure) ([place])     a. LVEF 35-40% by echo 09/2014.  . MI (myocardial infarction) ([place]) 1994; 1995  . Anginal pain ([place])   . Sleep apnea     "sleeps w/humidifyer when he panics and gets short of breath" (04/08/2015)  . Anxiety   . Arm pain 05/08/2015    LEFT ARM    Past Surgical History  Procedure Laterality Date  . Tonsillectomy    . Cholecystectomy open  ~ 1977  . Insert / replace / remove pacemaker  07/2008    Complete heart block status post DDD with good function  . Hiatal hernia  repair  [id number]  . Cardiac catheterization N/A 03/29/2015    Procedure: Right/Left Heart Cath and Coronary Angiography;  Surgeon: Kiah M [name], MD;  Location: [facility];  Service: Cardiovascular;  Laterality: N/A;  . Cardiac catheterization  1995    "after my MI; put me on heart RX after cath"  . Cardiac catheterization N/A 04/09/2015    Procedure: Coronary Stent Intervention;  Surgeon: Alvon M [name], MD;  Location: [facility];  Service: Cardiovascular;  Laterality: N/A;  . Ep implantable device N/A 09/23/2015    MDT CRT-D, Dr. Caryl Comes     Medications: Current Outpatient Prescriptions  Medication Sig Dispense Refill  . aspirin EC 81 MG tablet Take 81 mg by mouth daily.     [name] atorvastatin (LIPITOR) 40 MG tablet Take 1 tablet (40 mg total) by mouth daily. 90 tablet 3  . bacitracin ophthalmic ointment Place 1 application into both eyes at bedtime. Apply to eyelids  3  . carvedilol (COREG) 12.5 MG tablet Take 1 tablet (12.5 mg total) by mouth 2 (two) times daily with a meal. 60 tablet 11  . clopidogrel (PLAVIX) 75 MG tablet Take 1 tablet (75 mg total) by mouth daily with breakfast. 30 tablet 6  . colchicine 0.6 MG tablet Take 0.5 tablets (0.3 mg total) by mouth 3 (three) times daily. 90 tablet 3  . Eyelid Cleansers (AVENOVA) 0.01 % SOLN Apply 1 application topically 2 (two) times daily as needed.    . furosemide (LASIX) 20 MG tablet Take 1.5 tablets (30 mg total) by mouth daily. Take 1 and 1/2 tablets 30mg  daily 45 tablet 1  . hydrALAZINE (APRESOLINE) 25 MG tablet Take 1 tablet (25 mg total) by mouth 2 (two) times daily. 90 tablet 11  . isosorbide mononitrate (IMDUR) 30 MG 24 hr tablet Take 1 tablet (30 mg total) by mouth daily. 30 tablet 6  . LANTUS SOLOSTAR 100 UNIT/ML Solostar Pen INJECT 60 UNITS UNDER THE SKIN ONCE DAILY 45 mL 2  . NITROSTAT 0.4 MG SL tablet PLACE 1 TABLET (0.4 MG TOTAL) UNDER THE TONGUE EVERY 5 (FIVE) MINUTES X 3 DOSES AS NEEDED FOR CHEST PAIN. 25 tablet 3  . OVER THE COUNTER MEDICATION Place 1 drop into both eyes 3 (three) times daily. OTC Bausch-Lomb eye bath    . pantoprazole (PROTONIX) 40 MG tablet Take 1 tablet (40 mg total) by mouth daily. 30 tablet 6  . sertraline (ZOLOFT) 100 MG tablet Take 1.5 tablets (150 mg total) by mouth daily. 135 tablet 1  . tamsulosin (FLOMAX) 0.4 MG CAPS capsule Take 0.4 mg by mouth daily after supper.    [name] UNIFINE PENTIPS 31G X 5 MM MISC USE AS DIRECTED 100 each 0  . vitamin B-12 (CYANOCOBALAMIN) 1000 MCG tablet Take 1,000 mcg by mouth daily.     No current facility-administered medications for this visit.    Allergies: Allergies  Allergen Reactions  . Bee Venom Anaphylaxis  . Lyrica [Pregabalin] Other  (See Comments)    hallucinations  . Prednisone Other (See Comments)    hallucinations  . Zocor [Simvastatin] Nausea Only and Other (See Comments)    Headache with brand name only.  Can take the generic.    Social History: The patient  reports that he quit smoking about 8 years ago. His smoking use included Cigarettes. He has a 81 pack-year smoking history. He has never used smokeless tobacco. He reports that he drinks alcohol. He reports that he does not use illicit drugs.  Family History: [name] family history includes Heart attack in his brother; Leukemia in his father; Stroke in his mother and sister.   Review of Systems: Please see the history of present illness.   Otherwise, the review of systems is positive for none.   All other systems are reviewed and negative.   Physical Exam: VS:  BP 132/68 mmHg  Pulse 65  Ht 5\' 8"  (1.727 m)  Wt 102.059 kg (225 lb)  BMI 34.22 kg/m2 .  BMI Body mass index is 34.22 kg/(m^2).  Wt Readings from Last 3 Encounters:  11/28/15 102.059 kg (225 lb)  10/14/15 102.331 kg (225 lb 9.6 oz)  09/25/15 99.791 kg (220 lb)    General: Pleasant. Well developed, well nourished and in no acute distress.  HEENT: Normal. Neck: Supple, no JVD, carotid bruits, or masses noted.  Cardiac: Regular rate and rhythm. No murmurs, rubs, or gallops. 1+ edema.  Respiratory:  Lungs are clear to auscultation bilaterally with normal work of breathing.  GI: Soft and nontender.  MS: No deformity or atrophy. Gait and ROM intact. Skin: Warm and dry. Color is normal.  Neuro:  Strength and sensation are intact and no gross focal deficits noted.  Psych: Alert, appropriate and with normal affect.   LABORATORY DATA:  EKG:  EKG is not ordered today.  Lab Results  Component Value Date   WBC 7.8 09/18/2015   HGB 11.6* 06/26/2015   HCT 39.1 09/18/2015   PLT 207 09/18/2015   GLUCOSE 96 10/04/2015   CHOL 171 06/26/2015   TRIG 317* 06/26/2015   HDL 43 06/26/2015    LDLCALC 65 06/26/2015   ALT 16 06/26/2015   AST 18 06/26/2015   NA 141 10/04/2015   K 5.1 10/04/2015   CL 103 10/04/2015   CREATININE 1.94* 10/04/2015   BUN 42* 10/04/2015   CO2 27 10/04/2015   TSH 1.606 03/28/2015   PSA 0.55 09/19/2014   INR 0.9 09/18/2015   HGBA1C 6.9 06/26/2015   MICROALBUR 68.3* 09/19/2014    BNP (last 3 results) No results for input(s): BNP in the last 8760 hours.  ProBNP (last 3 results) No results for input(s): PROBNP in the last 8760 hours.   Other Studies Reviewed Today:  Echo Study Conclusions from 06/14/15:  Study Conclusions  - Left ventricle: The cavity size was normal. Wall thickness was  increased in a pattern of mild LVH. There was focal basal  hypertrophy. Systolic function was severely reduced. The  estimated ejection fraction was in the range of 25% to 30%.  Akinesis of the mid-apicalanteroseptal and apical myocardium.  Doppler parameters are consistent with abnormal left ventricular  relaxation (grade 1 diastolic dysfunction). - Left atrium: The atrium was mildly dilated. - Right atrium: The atrium was mildly dilated.  Assessment/Plan: 1. CAD - s/p  left heart catheterization revealing Mid LAD lesion, 90% stenosed, 2nd Diag lesion, 90% stenosed, 3rd Mrg lesion, 90% stenosed. He received drug-eluting stents to each lesion. Distal 70% circumflex lesion will be treated medically. Significant symptomatic improvement. He is on DAPT with Plavix and aspirin - will plan for  long term. He is to continue lifestyle modification.  2. ICM EF 25-30%. S/p BiV ICD. Plan to repeat Echo in June. Continue current therapy.  3. Chronic systolic HF - EF persistently low despite revascularization 25-30%. He is on optimal therapy with Coreg, diuretics, hydralazine, and nitrates. Not a candidate for ACEi/ARB or Entresto due to CKD.   4. HTN - controlled  5. HLD -  on statin therapy  Current medicines are reviewed with the patient today.  The  patient does not have concerns regarding medicines other than what has been noted above.  The following changes have been made:  See above.  Labs/ tests ordered today include:    No orders of the defined types were placed in this encounter.     Disposition:   FU with Dr. [name] 4 months.   Patient is agreeable to this plan and will call if any problems develop in the interim.   Signed: Tyrick [name] MD, [facility]    11/28/2015 11:48 AM

## 2015-11-28 NOTE — Patient Instructions (Signed)
Continue your current therapy  I will see you in 4 months  

## 2015-11-28 NOTE — Addendum Note (Signed)
Addended by: Claude Manges on: 11/28/2015 02:53 PM   Modules accepted: Orders

## 2015-12-02 MED FILL — TRUE METRIX GLUCOSE TEST ST: 30 days supply | Qty: 100 | Fill #4

## 2015-12-02 MED FILL — TAMSULOSIN HCL 0.4 MG CAP: 0.4 | 90 days supply | Qty: 90 | Fill #3

## 2015-12-05 MED FILL — FUROSEMIDE 40 MG TABLET: 40 | 30 days supply | Qty: 60 | Fill #1

## 2015-12-17 ENCOUNTER — Ambulatory Visit: Payer: 59

## 2015-12-24 ENCOUNTER — Encounter: Payer: 59 | Admitting: Internal Medicine

## 2015-12-24 NOTE — Progress Notes (Signed)
Patient Care Team: [name], MD as PCP - General (Family Medicine) Dimitri Ped, RN as [facility]   HPI  Rodney Farley is a 82 y.o. male Seen in follow-up for a pacemaker implanted in 2010 elsewhere. We saw him to establish in 2013. He has a history of complete heart block.     He has history of ischemic heart disease with prior MI. Myoview 2002 demonstrated ejection fraction of 49% with prior MI but no ischemia. Echocardiogram 3/16 ejection fraction was 35-40%   He underwent catheterization again 9/16 and multivessel PCI. Repeat echocardiogram 12/16 demonstrated persistent LV dysfunction with an EF of 25-30%.   He underwent CRT-D revision 3/17 which according to interval from Dr. [name]  resulted in much improved symptoms       Past Medical History  Diagnosis Date  . COPD (chronic obstructive pulmonary disease) (HCC)     Severe  . Hypercholesterolemia   . Depression   . Coronary artery disease     a. s/p MI in 1994/1995 while in [place] s/p questionable PCI. 03/2015: progression of disease, for staged PCI.  [name] Hypertension   . Atrioventricular block, complete ([place])     a. 2010 s/p pacemaker.  . Pacemaker     medtronic>>> MDT ICD 09/23/15  . Obesity   . TIA (transient ischemic attack) X 3  . Neuropathy (HCC)     IN LOWER EXTREMITIES  . GERD (gastroesophageal reflux disease)   . DOE (dyspnea on exertion) 03/28/2015  . CKD (chronic kidney disease), stage IV ([place])   . Type II diabetes mellitus ([place])   . Diabetic peripheral neuropathy ([place])   . Chronic systolic CHF (congestive heart failure) ([place])     a. LVEF 35-40% by echo 09/2014.  . MI (myocardial infarction) ([place]) 1994; 1995  . Anginal pain ([place])   . Sleep apnea     "sleeps w/humidifyer when he panics and gets short of breath" (04/08/2015)  . Anxiety   . Arm pain 05/08/2015    LEFT ARM    Past Surgical History  Procedure Laterality Date  . Tonsillectomy      . Cholecystectomy open  ~ 1977  . Insert / replace / remove pacemaker  07/2008    Complete heart block status post DDD with good function  . Hiatal hernia repair  1977  . Cardiac catheterization N/A 03/29/2015    Procedure: Right/Left Heart Cath and Coronary Angiography;  Surgeon: Cochise M [name], MD;  Location: [facility];  Service: Cardiovascular;  Laterality: N/A;  . Cardiac catheterization  1995    "after my MI; put me on heart RX after cath"  . Cardiac catheterization N/A 04/09/2015    Procedure: Coronary Stent Intervention;  Surgeon: Aston M [name], MD;  Location: [facility];  Service: Cardiovascular;  Laterality: N/A;  . Ep implantable device N/A 09/23/2015    MDT CRT-D, Dr. Caryl Comes    Current Outpatient Prescriptions  Medication Sig Dispense Refill  . aspirin EC 81 MG tablet Take 81 mg by mouth daily.    [name] atorvastatin (LIPITOR) 40 MG tablet Take 1 tablet (40 mg total) by mouth daily. 90 tablet 3  . bacitracin ophthalmic ointment Place 1 application into both eyes at bedtime. Apply to eyelids  3  . carvedilol (COREG) 12.5 MG tablet Take 1 tablet (12.5 mg total) by mouth 2 (two) times daily with a meal. 60 tablet 11  . clopidogrel (PLAVIX) 75  MG tablet Take 1 tablet (75 mg total) by mouth daily with breakfast. 30 tablet 6  . colchicine 0.6 MG tablet Take 0.5 tablets (0.3 mg total) by mouth 3 (three) times daily. 90 tablet 3  . Eyelid Cleansers (AVENOVA) 0.01 % SOLN Apply 1 application topically 2 (two) times daily as needed.    . furosemide (LASIX) 20 MG tablet Take 1.5 tablets (30 mg total) by mouth daily. Take 1 and 1/2 tablets 30mg  daily 45 tablet 1  . hydrALAZINE (APRESOLINE) 25 MG tablet Take 1 tablet (25 mg total) by mouth 2 (two) times daily. 90 tablet 11  . isosorbide mononitrate (IMDUR) 30 MG 24 hr tablet Take 1 tablet (30 mg total) by mouth daily. 30 tablet 6  . LANTUS SOLOSTAR 100 UNIT/ML Solostar Pen INJECT 60 UNITS UNDER THE SKIN ONCE DAILY 45 mL 2  .  NITROSTAT 0.4 MG SL tablet PLACE 1 TABLET (0.4 MG TOTAL) UNDER THE TONGUE EVERY 5 (FIVE) MINUTES X 3 DOSES AS NEEDED FOR CHEST PAIN. 25 tablet 3  . OVER THE COUNTER MEDICATION Place 1 drop into both eyes 3 (three) times daily. OTC B[place]ch[place]omb eye bath    . pantoprazole (PROTONIX) 40 MG tablet Take 1 tablet (40 mg total) by mouth daily. 30 tablet 6  . sertraline (ZOLOFT) 100 MG tablet Take 1.5 tablets (150 mg total) by mouth daily. 135 tablet 1  . tamsulosin (FLOMAX) 0.4 MG CAPS capsule Take 0.4 mg by mouth daily after supper.    [name] UNIFINE PENTIPS 31G X 5 MM MISC USE AS DIRECTED 100 each 0  . vitamin B-12 (CYANOCOBALAMIN) 1000 MCG tablet Take 1,000 mcg by mouth daily.     No current facility-administered medications for this visit.    Allergies  Allergen Reactions  . Bee Venom Anaphylaxis  . Lyrica [Pregabalin] Other (See Comments)    hallucinations  . Prednisone Other (See Comments)    hallucinations  . Zocor [Simvastatin] Nausea Only and Other (See Comments)    Headache with brand name only.  Can take the generic.    Review of Systems negative except from HPI and PMH  Physical Exam There were no vitals taken for this visit. Well developed and well nourished in no acute distress HENT normal E scleral and icterus clear Neck Supple JVP flat; carotids brisk and full Clear to ausculation  Regular rate and rhythm, no murmurs gallops or rub Soft with active bowel sounds No clubbing cyanosis  Edema Alert and oriented, grossly normal motor and sensory function Skin Warm and Dry     Assessment and  Plan Complete heart block  Ischemic heart disease with prior MI  Pacemaker  Medtronic [name] device was interrogated.  The information was reviewed. No changes were made in the programming.     Hyperlipidemia  Renal insufficiency  Depression   He is much improved. His renal function is stable. Given his age and renal insufficiency, upgrade for only ICD I don't think  would be appropriate.  However, it is reasonable to consider CRT upgrade. Looking back, we do not have data related to his LV function prior to pacemaker insertion. However, within 2 months the LV function was 40-45%. It is unlikely that the LV function. That much 2 months of pacing; hence, I think it is likely that he had a preceding cardiomyopathy. This then much lower likelihood of improvement in LV systolic function with CRT upgrade. However, there is still some significant likelihood probably in the 40-50% range whereby LV function  could improve and functional status improved with resynchronization. He would like to pursue that. In this vein, and tendon, it becomes less unreasonable to advance an icd lead at the time of the procedure. he will consider this with his family.  We have reviewed the benefits and risks of generator replacement.  These include but are not limited to lead fracture and infection.  The patient understands, agrees and is willing to proceed.     More than 50% of 45 min was spent in counseling related to the above

## 2015-12-25 ENCOUNTER — Other Ambulatory Visit: Payer: Self-pay | Admitting: Cardiology

## 2015-12-25 ENCOUNTER — Telehealth: Payer: Self-pay

## 2015-12-25 ENCOUNTER — Encounter: Payer: Self-pay | Admitting: Internal Medicine

## 2015-12-25 MED FILL — PANTOPRAZOLE SOD DR 40 MG T: 40 | 30 days supply | Qty: 30 | Fill #1

## 2015-12-25 MED FILL — ISOSORBIDE MN ER 30 MG TAB: 30 | 30 days supply | Qty: 30 | Fill #1

## 2015-12-25 NOTE — Telephone Encounter (Addendum)
Pt is very depressed over the death of his sister on 2016-01-06 and he would like to know if he can double up on his sertatline 100 mg which he takes 1 per day now   Best number [phone]

## 2015-12-26 MED FILL — CLOPIDOGREL 75 MG TABLET: 75 | 30 days supply | Qty: 30 | Fill #0

## 2015-12-26 NOTE — Telephone Encounter (Signed)
Rx request sent to pharmacy.  

## 2015-12-27 NOTE — Telephone Encounter (Signed)
My note shows taking 150 daily 1.5x 100 tho he may have decreased this. He may increase his dose by 50mg  whether at 100 or at 150 but not go up more than 50 at a time and not over 200mg  max Ok to call in change of dosing with 36months of meds

## 2015-12-30 MED FILL — hydrALAZINE HCL 25 MG TABS: 25 | 30 days supply | Qty: 90 | Fill #6

## 2016-01-01 ENCOUNTER — Encounter (HOSPITAL_BASED_OUTPATIENT_CLINIC_OR_DEPARTMENT_OTHER): Payer: 59

## 2016-01-02 ENCOUNTER — Other Ambulatory Visit: Payer: Self-pay

## 2016-01-02 NOTE — Telephone Encounter (Signed)
Pt's wife advised.

## 2016-01-03 NOTE — Patient Outreach (Signed)
[facility]) Care Management  01/03/2016  Rodney Farley [birth year] [id number]   Member did not show for scheduled Link to Wellness appointment.  Missed appointment letter sent. Corbin Garter RN, [facility] Care Management Coordinator-Link to [place] Management [phone]

## 2016-01-06 ENCOUNTER — Ambulatory Visit (HOSPITAL_COMMUNITY): Payer: BLUE CROSS/BLUE SHIELD

## 2016-01-10 ENCOUNTER — Encounter (HOSPITAL_COMMUNITY): Payer: Self-pay | Admitting: *Deleted

## 2016-01-10 ENCOUNTER — Encounter: Payer: Self-pay | Admitting: Emergency Medicine

## 2016-01-10 ENCOUNTER — Emergency Department (HOSPITAL_COMMUNITY)
Admission: EM | Admit: 2016-01-10 | Discharge: 2016-01-10 | Disposition: A | Discharge status: Home / Self Care | Payer: BLUE CROSS/BLUE SHIELD | Attending: Emergency Medicine | Admitting: Emergency Medicine

## 2016-01-10 ENCOUNTER — Ambulatory Visit (INDEPENDENT_AMBULATORY_CARE_PROVIDER_SITE_OTHER): Payer: BLUE CROSS/BLUE SHIELD | Admitting: Physician Assistant

## 2016-01-10 ENCOUNTER — Telehealth: Payer: Self-pay

## 2016-01-10 VITALS — BP 130/62 | HR 88 | Temp 98.6°F | Resp 16 | Ht 67.5 in | Wt 217.0 lb

## 2016-01-10 DIAGNOSIS — I2510 Atherosclerotic heart disease of native coronary artery without angina pectoris: Secondary | ICD-10-CM | POA: Diagnosis not present

## 2016-01-10 DIAGNOSIS — Z794 Long term (current) use of insulin: Secondary | ICD-10-CM | POA: Insufficient documentation

## 2016-01-10 DIAGNOSIS — Z79899 Other long term (current) drug therapy: Secondary | ICD-10-CM | POA: Insufficient documentation

## 2016-01-10 DIAGNOSIS — N184 Chronic kidney disease, stage 4 (severe): Secondary | ICD-10-CM | POA: Diagnosis not present

## 2016-01-10 DIAGNOSIS — B028 Zoster with other complications: Secondary | ICD-10-CM

## 2016-01-10 DIAGNOSIS — J449 Chronic obstructive pulmonary disease, unspecified: Secondary | ICD-10-CM | POA: Diagnosis not present

## 2016-01-10 DIAGNOSIS — I252 Old myocardial infarction: Secondary | ICD-10-CM | POA: Insufficient documentation

## 2016-01-10 DIAGNOSIS — Z950 Presence of cardiac pacemaker: Secondary | ICD-10-CM | POA: Insufficient documentation

## 2016-01-10 DIAGNOSIS — B029 Zoster without complications: Secondary | ICD-10-CM | POA: Insufficient documentation

## 2016-01-10 DIAGNOSIS — Z8673 Personal history of transient ischemic attack (TIA), and cerebral infarction without residual deficits: Secondary | ICD-10-CM | POA: Insufficient documentation

## 2016-01-10 DIAGNOSIS — G510 Bell's palsy: Secondary | ICD-10-CM | POA: Diagnosis not present

## 2016-01-10 DIAGNOSIS — H169 Unspecified keratitis: Secondary | ICD-10-CM | POA: Insufficient documentation

## 2016-01-10 DIAGNOSIS — E1122 Type 2 diabetes mellitus with diabetic chronic kidney disease: Secondary | ICD-10-CM | POA: Diagnosis not present

## 2016-01-10 DIAGNOSIS — I130 Hypertensive heart and chronic kidney disease with heart failure and stage 1 through stage 4 chronic kidney disease, or unspecified chronic kidney disease: Secondary | ICD-10-CM | POA: Insufficient documentation

## 2016-01-10 DIAGNOSIS — I5022 Chronic systolic (congestive) heart failure: Secondary | ICD-10-CM | POA: Insufficient documentation

## 2016-01-10 DIAGNOSIS — H5710 Ocular pain, unspecified eye: Secondary | ICD-10-CM | POA: Diagnosis present

## 2016-01-10 DIAGNOSIS — E1142 Type 2 diabetes mellitus with diabetic polyneuropathy: Secondary | ICD-10-CM | POA: Diagnosis not present

## 2016-01-10 DIAGNOSIS — Z7982 Long term (current) use of aspirin: Secondary | ICD-10-CM | POA: Insufficient documentation

## 2016-01-10 DIAGNOSIS — Z87891 Personal history of nicotine dependence: Secondary | ICD-10-CM | POA: Insufficient documentation

## 2016-01-10 LAB — CBC
HEMATOCRIT: 37.6 % — AB (ref 39.0–52.0)
HEMOGLOBIN: 12.7 g/dL — AB (ref 13.0–17.0)
MCH: 30 pg (ref 26.0–34.0)
MCHC: 33.8 g/dL (ref 30.0–36.0)
MCV: 88.9 fL (ref 78.0–100.0)
Platelets: 205 10*3/uL (ref 150–400)
RBC: 4.23 MIL/uL (ref 4.22–5.81)
RDW: 14.9 % (ref 11.5–15.5)
WBC: 9.3 10*3/uL (ref 4.0–10.5)

## 2016-01-10 LAB — COMPREHENSIVE METABOLIC PANEL
ALK PHOS: 70 U/L (ref 38–126)
ALT: 18 U/L (ref 17–63)
ANION GAP: 10 (ref 5–15)
AST: 17 U/L (ref 15–41)
Albumin: 3.6 g/dL (ref 3.5–5.0)
BILIRUBIN TOTAL: 0.6 mg/dL (ref 0.3–1.2)
BUN: 46 mg/dL — ABNORMAL HIGH (ref 6–20)
CALCIUM: 9.5 mg/dL (ref 8.9–10.3)
CO2: 22 mmol/L (ref 22–32)
Chloride: 106 mmol/L (ref 101–111)
Creatinine, Ser: 2.74 mg/dL — ABNORMAL HIGH (ref 0.61–1.24)
GFR, EST AFRICAN AMERICAN: 23 mL/min — AB (ref >60)
GFR, EST NON AFRICAN AMERICAN: 20 mL/min — AB (ref >60)
GLUCOSE: 94 mg/dL (ref 65–99)
Potassium: 4.1 mmol/L (ref 3.5–5.1)
Sodium: 138 mmol/L (ref 135–145)
Total Protein: 6.3 g/dL — ABNORMAL LOW (ref 6.5–8.1)

## 2016-01-10 LAB — PROTIME-INR
INR: 1.05 (ref 0.00–1.49)
Prothrombin Time: 13.9 seconds (ref 11.6–15.2)

## 2016-01-10 LAB — DIFFERENTIAL
Basophils Absolute: 0 10*3/uL (ref 0.0–0.1)
Basophils Relative: 0 %
EOS PCT: 3 %
Eosinophils Absolute: 0.3 10*3/uL (ref 0.0–0.7)
LYMPHS ABS: 2.4 10*3/uL (ref 0.7–4.0)
LYMPHS PCT: 26 %
MONO ABS: 0.7 10*3/uL (ref 0.1–1.0)
MONOS PCT: 7 %
Neutro Abs: 5.9 10*3/uL (ref 1.7–7.7)
Neutrophils Relative %: 64 %

## 2016-01-10 LAB — I-STAT TROPONIN, ED: Troponin i, poc: 0 ng/mL (ref 0.00–0.08)

## 2016-01-10 LAB — CBG MONITORING, ED: GLUCOSE-CAPILLARY: 98 mg/dL (ref 65–99)

## 2016-01-10 MED ORDER — LIDOCAINE VISCOUS 2 % MT SOLN: 15.0000 mL | OROMUCOSAL | Status: DC | PRN

## 2016-01-10 MED ORDER — HYPROMELLOSE (GONIOSCOPIC) 2.5 % OP SOLN: 1.0000 [drp] | Freq: Four times a day (QID) | OPHTHALMIC | Status: DC | PRN

## 2016-01-10 MED ORDER — PREDNISONE 10 MG PO TABS: 60.0000 mg | ORAL_TABLET | Freq: Every day | ORAL | Status: DC

## 2016-01-10 MED ORDER — HYDROCODONE-ACETAMINOPHEN 5-325 MG PO TABS: 1.0000 | ORAL_TABLET | Freq: Four times a day (QID) | ORAL | Status: DC | PRN

## 2016-01-10 MED ORDER — HYDROMORPHONE HCL 1 MG/ML IJ SOLN
1.0000 mg | Freq: Once | INTRAMUSCULAR | Status: AC
  Administered 2016-01-10: 1 mg via INTRAVENOUS
  Filled 2016-01-10: qty 1

## 2016-01-10 MED ORDER — VALACYCLOVIR HCL 1 G PO TABS: 1000.0000 mg | ORAL_TABLET | Freq: Two times a day (BID) | ORAL | Status: DC

## 2016-01-10 MED ORDER — ARTIFICIAL TEARS OP OINT
1.0000 "application " | TOPICAL_OINTMENT | Freq: Once | OPHTHALMIC | Status: AC
  Administered 2016-01-10: 1 via OPHTHALMIC
  Filled 2016-01-10: qty 3.5

## 2016-01-10 MED FILL — COLCRYS 0.6 MG TABLET: 0.6 | 60 days supply | Qty: 90 | Fill #2 | Status: TO

## 2016-01-10 MED FILL — LIDOCAINE 2% VISCOUS SOLN: 2 | 7 days supply | Qty: 100 | Fill #0

## 2016-01-10 MED FILL — valACYclovir HCL 1 GM TABS: 1 | 10 days supply | Qty: 20 | Fill #0

## 2016-01-10 NOTE — Discharge Instructions (Signed)
Please take the medicine as prescribed. See a neurologist in 7-10 days.   Neuropathic Pain Neuropathic pain is pain caused by damage to the nerves that are responsible for certain sensations in your body (sensory nerves). The pain can be caused by damage to:   The sensory nerves that send signals to your spinal cord and brain (peripheral nervous system).  The sensory nerves in your brain or spinal cord (central nervous system). Neuropathic pain can make you more sensitive to pain. What would be a minor sensation for most people may feel very painful if you have neuropathic pain. This is usually a long-term condition that can be difficult to treat. The type of pain can differ from person to person. It may start suddenly (acute), or it may develop slowly and last for a long time (chronic). Neuropathic pain may come and go as damaged nerves heal or may stay at the same level for years. It often causes emotional distress, loss of sleep, and a lower quality of life. CAUSES  The most common cause of damage to a sensory nerve is diabetes. Many other diseases and conditions can also cause neuropathic pain. Causes of neuropathic pain can be classified as:  Toxic. Many drugs and chemicals can cause toxic damage. The most common cause of toxic neuropathic pain is damage from drug treatment for cancer (chemotherapy).  Metabolic. This type of pain can happen when a disease causes imbalances that damage nerves. Diabetes is the most common of these diseases. Vitamin B deficiency caused by long-term alcohol abuse is another common cause.  Traumatic. Any injury that cuts, crushes, or stretches a nerve can cause damage and pain. A common example is feeling pain after losing an arm or leg (phantom limb pain).  Compression-related. If a sensory nerve gets trapped or compressed for a long period of time, the blood supply to the nerve can be cut off.  Vascular. Many blood vessel diseases can cause neuropathic pain  by decreasing blood supply and oxygen to nerves.  Autoimmune. This type of pain results from diseases in which the body's defense system mistakenly attacks sensory nerves. Examples of autoimmune diseases that can cause neuropathic pain include lupus and multiple sclerosis.  Infectious. Many types of viral infections can damage sensory nerves and cause pain. Shingles infection is a common cause of this type of pain.  Inherited. Neuropathic pain can be a symptom of many diseases that are passed down through families (genetic). SIGNS AND SYMPTOMS  The main symptom is pain. Neuropathic pain is often described as:  Burning.  Shock-like.  Stinging.  Hot or cold.  Itching. DIAGNOSIS  No single test can diagnose neuropathic pain. Your health care provider will do a physical exam and ask you about your pain. You may use a pain scale to describe how bad your pain is. You may also have tests to see if you have a high sensitivity to pain and to help find the cause and location of any sensory nerve damage. These tests may include:  Imaging studies, such as:  X-rays.  CT scan.  MRI.  Nerve conduction studies to test how well nerve signals travel through your sensory nerves (electrodiagnostic testing).  Stimulating your sensory nerves through electrodes on your skin and measuring the response in your spinal cord and brain (somatosensory evoked potentials). TREATMENT  Treatment for neuropathic pain may change over time. You may need to try different treatment options or a combination of treatments. Some options include:  Over-the-counter pain relievers.  Prescription  medicines. Some medicines used to treat other conditions may also help neuropathic pain. These include medicines to:  Control seizures (anticonvulsants).  Relieve depression (antidepressants).  Prescription-strength pain relievers (narcotics). These are usually used when other pain relievers do not help.  Transcutaneous  nerve stimulation (TENS). This uses electrical currents to block painful nerve signals. The treatment is painless.  Topical and local anesthetics. These are medicines that numb the nerves. They can be injected as a nerve block or applied to the skin.  Alternative treatments, such as:  Acupuncture.  Meditation.  Massage.  Physical therapy.  Pain management programs.  Counseling. HOME CARE INSTRUCTIONS  Learn as much as you can about your condition.  Take medicines only as directed by your health care provider.  Work closely with all your health care providers to find what works best for you.  Have a good support system at home.  Consider joining a chronic pain support group. SEEK MEDICAL CARE IF:  Your pain treatments are not helping.  You are having side effects from your medicines.  You are struggling with fatigue, mood changes, depression, or anxiety.   This information is not intended to replace advice given to you by your health care provider. Make sure you discuss any questions you have with your health care provider.   Document Released: 03/26/2004 Document Revised: 07/20/2014 Document Reviewed: 12/07/2013 Elsevier Interactive Patient Education 2016 [place] palsy is a condition in which the muscles on one side of the face become paralyzed. This often causes one side of the face to droop. It is a common condition and most people recover completely. RISK FACTORS Risk factors for Bell palsy include:  Pregnancy.  Diabetes.  An infection by a virus, such as infections that cause cold sores. CAUSES  Bell palsy is caused by damage to or inflammation of a nerve in your face. It is unclear why this happens, but an infection by a virus may lead to it. Most of the time the reason it happens is unknown. SIGNS AND SYMPTOMS  Symptoms can range from mild to severe and can take place over a number of hours. Symptoms may include:  Being unable  to:  Raise one or both eyebrows.  Close one or both eyes.  Feel parts of your face (facial numbness).  Drooping of the eyelid and corner of the mouth.  Weakness in the face.  Paralysis of half your face.  Loss of taste.  Sensitivity to loud noises.  Difficulty chewing.  Tearing up of the affected eye.  Dryness in the affected eye.  Drooling.  Pain behind one ear. DIAGNOSIS  Diagnosis of Bell palsy may include:  A medical history and physical exam.  An MRI.  A CT scan.  Electromyography (EMG). This is a test that checks how your nerves are working. TREATMENT  Treatment may include antiviral medicine to help shorten the length of the condition. Sometimes treatment is not needed and the symptoms go away on their own. HOME CARE INSTRUCTIONS   Take medicines only as directed by your health care provider.  Do facial massages and exercises as directed by your health care provider.  If your eye is affected:  Use moisturizing eye drops to prevent drying of your eye as directed by your health care provider.  Protect your eye as directed by your health care provider. SEEK MEDICAL CARE IF:  Your symptoms do not get better or get worse.  You are drooling.  Your eye is red,  irritated, or hurts. SEEK IMMEDIATE MEDICAL CARE IF:   Another part of your body feels weak or numb.  You have difficulty swallowing.  You have a fever along with symptoms of Bell palsy.  You develop neck pain. MAKE SURE YOU:   Understand these instructions.  Will watch your condition.  Will get help right away if you are not doing well or get worse.   This information is not intended to replace advice given to you by your health care provider. Make sure you discuss any questions you have with your health care provider.   Document Released: 06/29/2005 Document Revised: 03/20/2015 Document Reviewed: 10/06/2013 Elsevier Interactive Patient Education [employer].

## 2016-01-10 NOTE — Progress Notes (Unsigned)
   Subjective:    Patient ID: Rodney Farley, male    DOB: 02-Oct-1931, 82 y.o.   MRN: [id number]  HPI    Review of Systems     Objective:   Physical Exam        Assessment & Plan:    Pt returned to the clinic post vist 6/30 with complaints of upper left eye tingling sensation and tenderness after leaving visit today Visual disturbance in left eye with Hx Shinlgles left palette Pt assessed by Philis Fendt with orders to send to Dr. Shanon Rosser office Pt given 1230pm appointment today @ [facility], [address] Dr. Shanon Rosser

## 2016-01-10 NOTE — Progress Notes (Signed)
01/10/2016 8:50 AM   DOB: 03-22-1932 / MRN: [id number]  SUBJECTIVE:  Rodney Farley is a 82 y.o. male presenting for pain about the left palate.  This started 6 days ago and was getting worse up to yesterday, and today he reports feeling somewhat better but felt he should be checked.  He has not had the shingles shot.    Immunization History  Administered Date(s) Administered  . Influenza,inj,Quad PF,36+ Mos 08/27/2013, 09/19/2014, 04/10/2015  . Pneumococcal Conjugate-13 09/19/2014  . Tdap 09/19/2014     He is allergic to bee venom; lyrica; prednisone; and zocor.   He  has a past medical history of COPD (chronic obstructive pulmonary disease) ([place]); Hypercholesterolemia; Depression; Coronary artery disease; Hypertension; Atrioventricular block, complete ([place]); Pacemaker; Obesity; TIA (transient ischemic attack) (X 3); Neuropathy (HCC); GERD (gastroesophageal reflux disease); DOE (dyspnea on exertion) (03/28/2015); CKD (chronic kidney disease), stage IV ([place]); Type II diabetes mellitus ([place]); Diabetic peripheral neuropathy ([place]); Chronic systolic CHF (congestive heart failure) ([place]); MI (myocardial infarction) ([place]) (1994; 1995); Anginal pain ([place]); Sleep apnea; Anxiety; and Arm pain (05/08/2015).    He  reports that he quit smoking about 8 years ago. His smoking use included Cigarettes. He has a 81 pack-year smoking history. He has never used smokeless tobacco. He reports that he drinks alcohol. He reports that he does not use illicit drugs. He  reports that he does not engage in sexual activity. The patient  has past surgical history that includes Tonsillectomy; Cholecystectomy open (~ 1977); Insert / replace / remove pacemaker (07/2008); Hiatal hernia repair (1977); Cardiac catheterization (N/A, 03/29/2015); Cardiac catheterization (1995); Cardiac catheterization (N/A, 04/09/2015); and Cardiac catheterization (N/A, 09/23/2015).  His family history includes Heart attack in his brother; Leukemia  in his father; Stroke in his mother and sister.  Review of Systems  Constitutional: Negative for fever and chills.  Eyes: Negative for blurred vision.  Respiratory: Negative for cough.   Cardiovascular: Negative for chest pain.  Musculoskeletal: Negative for myalgias.  Skin: Positive for itching and rash.  Neurological: Negative for dizziness and headaches.  Psychiatric/Behavioral: Negative for depression.    Problem list and medications reviewed and updated by myself where necessary, and exist elsewhere in the encounter.   OBJECTIVE:  BP 130/62 mmHg  Pulse 88  Temp(Src) 98.6 F (37 C) (Oral)  Resp 16  Ht 5' 7.5" (1.715 m)  Wt 217 lb (98.431 kg)  BMI 33.47 kg/m2  SpO2 94%  Physical Exam  Constitutional: He is oriented to person, place, and time. He appears well-developed. He does not appear ill.  HENT:  Head: Normocephalic and atraumatic.  Nose: Nose normal.  Mouth/Throat: Uvula is midline, oropharynx is clear and moist and mucous membranes are normal.    Eyes: Conjunctivae and EOM are normal. Pupils are equal, round, and reactive to light.  Cardiovascular: Normal rate.   Pulmonary/Chest: Effort normal.  Abdominal: He exhibits no distension.  Musculoskeletal: Normal range of motion.  Neurological: He is alert and oriented to person, place, and time. No cranial nerve deficit. Coordination normal.  Skin: Skin is warm and dry. He is not diaphoretic.  Psychiatric: He has a normal mood and affect.  Nursing note and vitals reviewed.   No results found for this or any previous visit (from the past 72 hour(s)).  No results found.  ASSESSMENT AND PLAN  Shanga was seen today for oral pain.  Diagnoses and all orders for this visit:  Herpes zoster infection of oral mucosa: His airway is not compromised.  This infection is greater than 44 days old now. Will provide Valtrex and numbing agent.   -     valACYclovir (VALTREX) 1000 MG tablet; Take 1 tablet (1,000 mg total) by  mouth 2 (two) times daily. -     lidocaine (XYLOCAINE) 2 % solution; Use as directed 15 mLs in the mouth or throat as needed for mouth pain. Do not swallow.   The patient was advised to call or return to clinic if he does not see an improvement in symptoms or to seek the care of the closest emergency department if he worsens with the above plan.   Philis Fendt, MHS, PA-C [facility] 01/10/2016 8:50 AM

## 2016-01-10 NOTE — Patient Instructions (Addendum)
Please fill both medications and use as instructed.  If not better in 72-96 hours then please contact me.  Please go to your pharmacy and get your shingles shot in 2 weeks, we have called in this order for you.    IF you received an x-ray today, you will receive an invoice from [facility]. Please contact [facility] at [phone] with questions or concerns regarding your invoice.   IF you received labwork today, you will receive an invoice from [employer]. Please contact [facility] at [phone] with questions or concerns regarding your invoice.   Our billing staff will not be able to assist you with questions regarding bills from these companies.  You will be contacted with the lab results as soon as they are available. The fastest way to get your results is to activate your My Chart account. Instructions are located on the last page of this paperwork. If you have not heard from [place] regarding the results in 2 weeks, please contact this office.

## 2016-01-10 NOTE — ED Provider Notes (Signed)
CSN: [id number]     Arrival date & time 01/10/16  1512 History   First MD Initiated Contact with Patient 01/10/16 1557     Chief Complaint  Patient presents with  . Eye Pain     (Consider location/radiation/quality/duration/timing/severity/associated sxs/prior Treatment) HPI Comments: Pt comes in with cc of eye pain and L sided facial droop. Pt has hx of TIA, IDDM, CAD, COPD, Pacemaker. He reports that he started having pain in his mouth L side and eye about 10 days ago. He saw his PCP today, and was diagnosed with herpes zoster infection. He went to see Opthalmologist, and he was informed that there was no herpes infection, and that patient might have Bell's palsy or a stroke, and was sent to the ER for further evaluation. Pt has a visible droop on the L side, and he reports that when he shaved this morning at 6:30 he had no droop -the drop was made obvious at the [facility] clinic. No associated numbness, tingling, weakness.  Pt has burning type pain in the mouth and also in the eye.   ROS 10 Systems reviewed and are negative for acute change except as noted in the HPI.      Patient is a 82 y.o. male presenting with eye pain. The history is provided by the patient and medical records.  Eye Pain    Past Medical History  Diagnosis Date  . COPD (chronic obstructive pulmonary disease) (HCC)     Severe  . Hypercholesterolemia   . Depression   . Coronary artery disease     a. s/p MI in 1994/1995 while in [place] s/p questionable PCI. 03/2015: progression of disease, for staged PCI.  [name] Hypertension   . Atrioventricular block, complete ([place])     a. 2010 s/p pacemaker.  . Pacemaker     medtronic>>> MDT ICD 09/23/15  . Obesity   . TIA (transient ischemic attack) X 3  . Neuropathy (HCC)     IN LOWER EXTREMITIES  . GERD (gastroesophageal reflux disease)   . DOE (dyspnea on exertion) 03/28/2015  . CKD (chronic kidney disease), stage IV ([place])   . Type II diabetes mellitus ([place])   .  Diabetic peripheral neuropathy ([place])   . Chronic systolic CHF (congestive heart failure) ([place])     a. LVEF 35-40% by echo 09/2014.  . MI (myocardial infarction) ([place]) 1994; 1995  . Anginal pain ([place])   . Sleep apnea     "sleeps w/humidifyer when he panics and gets short of breath" (04/08/2015)  . Anxiety   . Arm pain 05/08/2015    LEFT ARM   Past Surgical History  Procedure Laterality Date  . Tonsillectomy    . Cholecystectomy open  ~ 1977  . Insert / replace / remove pacemaker  07/2008    Complete heart block status post DDD with good function  . Hiatal hernia repair  1977  . Cardiac catheterization N/A 03/29/2015    Procedure: Right/Left Heart Cath and Coronary Angiography;  Surgeon: Myking M [name], MD;  Location: [facility];  Service: Cardiovascular;  Laterality: N/A;  . Cardiac catheterization  1995    "after my MI; put me on heart RX after cath"  . Cardiac catheterization N/A 04/09/2015    Procedure: Coronary Stent Intervention;  Surgeon: Dontavis M [name], MD;  Location: [facility];  Service: Cardiovascular;  Laterality: N/A;  . Ep implantable device N/A 09/23/2015    MDT CRT-D, Dr. Caryl Comes   Family History  Problem Relation Age of Onset  . Stroke Mother   . Leukemia Father   . Stroke Sister   . Heart attack Brother    Social History  Substance Use Topics  . Smoking status: Former Smoker -- 1.50 packs/day for 54 years    Types: Cigarettes    Quit date: 07/18/2007  . Smokeless tobacco: Never Used  . Alcohol Use: Yes     Comment: 04/08/2015 "probably 3 drinks/month"    Review of Systems  Eyes: Positive for pain.      Allergies  Bee venom; Lyrica; Prednisone; and Zocor  Home Medications   Prior to Admission medications   Medication Sig Start Date End Date Taking? Authorizing Provider  aspirin EC 81 MG tablet Take 81 mg by mouth daily.    Historical Provider, MD  atorvastatin (LIPITOR) 40 MG tablet Take 1 tablet (40 mg total) by mouth daily. 04/19/15    [name], MD  bacitracin ophthalmic ointment Place 1 application into both eyes at bedtime. Apply to eyelids 08/23/15   Historical Provider, MD  carvedilol (COREG) 12.5 MG tablet Take 1 tablet (12.5 mg total) by mouth 2 (two) times daily with a meal. 07/09/15   Eutimio M [name], MD  clopidogrel (PLAVIX) 75 MG tablet TAKE 1 TABLET BY MOUTH DAILY WITH BREAKFAST 12/26/15   Tupac M [name], MD  colchicine 0.6 MG tablet Take 0.5 tablets (0.3 mg total) by mouth 3 (three) times daily. 06/18/15   [name], MD  Eyelid Cleansers (AVENOVA) 0.01 % SOLN Apply 1 application topically 2 (two) times daily as needed.    Historical Provider, MD  furosemide (LASIX) 20 MG tablet Take 1.5 tablets (30 mg total) by mouth daily. Take 1 and 1/2 tablets 30mg  daily 09/24/15   Baldwin Jamaica, PA-C  hydrALAZINE (APRESOLINE) 25 MG tablet Take 1 tablet (25 mg total) by mouth 2 (two) times daily. 10/03/15   Juma M [name], MD  HYDROcodone-acetaminophen (NORCO/VICODIN) 5-325 MG tablet Take 1 tablet by mouth every 6 (six) hours as needed. 01/10/16   Varney Biles, MD  hydroxypropyl methylcellulose / hypromellose (ISOPTO TEARS / GONIOVISC) 2.5 % ophthalmic solution Place 1 drop into the left eye 4 (four) times daily as needed for dry eyes. 01/10/16   Varney Biles, MD  isosorbide mononitrate (IMDUR) 30 MG 24 hr tablet Take 1 tablet (30 mg total) by mouth daily. 11/28/15   Efton M [name], MD  LANTUS SOLOSTAR 100 UNIT/ML Solostar Pen INJECT 60 UNITS UNDER THE SKIN ONCE DAILY 11/09/15   [name], MD  lidocaine (XYLOCAINE) 2 % solution Use as directed 15 mLs in the mouth or throat as needed for mouth pain. Do not swallow. 01/10/16   Tereasa Coop, PA-C  NITROSTAT 0.4 MG SL tablet PLACE 1 TABLET (0.4 MG TOTAL) UNDER THE TONGUE EVERY 5 (FIVE) MINUTES X 3 DOSES AS NEEDED FOR CHEST PAIN. 11/05/15   Deboraha Sprang, MD  OVER THE COUNTER MEDICATION Place 1 drop into both eyes 3 (three) times daily. OTC Bausch-Lomb eye bath     Historical Provider, MD  pantoprazole (PROTONIX) 40 MG tablet Take 1 tablet (40 mg total) by mouth daily. 11/28/15   Joanathan M [name], MD  predniSONE (DELTASONE) 10 MG tablet Take 6 tablets (60 mg total) by mouth daily. 01/10/16   Varney Biles, MD  sertraline (ZOLOFT) 100 MG tablet Take 1.5 tablets (150 mg total) by mouth daily. 04/11/15   [name], MD  tamsulosin (FLOMAX) 0.4 MG CAPS  capsule Take 0.4 mg by mouth daily after supper.    Historical Provider, MD  UNIFINE PENTIPS 31G X 5 MM MISC USE AS DIRECTED 09/28/15   [name], MD  valACYclovir (VALTREX) 1000 MG tablet Take 1 tablet (1,000 mg total) by mouth 2 (two) times daily. 01/10/16   Tereasa Coop, PA-C  vitamin B-12 (CYANOCOBALAMIN) 1000 MCG tablet Take 1,000 mcg by mouth daily.    Historical Provider, MD   BP 107/70 mmHg  Pulse 73  Temp(Src) 98.8 F (37.1 C) (Oral)  Resp 19  Ht 5\' 7"  (1.702 m)  Wt 219 lb 12.8 oz (99.701 kg)  BMI 34.42 kg/m2  SpO2 97% Physical Exam  Constitutional: He is oriented to person, place, and time. He appears well-developed.  HENT:  Head: Atraumatic.  L lateral tongue and palate - pt has generalized  Pustules.   Eyes: Conjunctivae are normal.  Neck: Neck supple.  Cardiovascular: Normal rate.   Pulmonary/Chest: Effort normal.  Abdominal: Soft. He exhibits no distension. There is no tenderness.  Neurological: He is alert and oriented to person, place, and time.  Pt has a facial droop on the L side, with central sparing. His eye is drooping a lttle Cerebellar exam is normal (finger to nose) Sensory exam normal for bilateral upper and lower extremities - and patient is able to discriminate between sharp and dull. Motor exam is 4+/5    Skin: Skin is warm.  Nursing note and vitals reviewed.   ED Course  Procedures (including critical care time) Labs Review Labs Reviewed  CBC - Abnormal; Notable for the following:    Hemoglobin 12.7 (*)    HCT 37.6 (*)    All other components  within normal limits  PROTIME-INR  DIFFERENTIAL  COMPREHENSIVE METABOLIC PANEL  URINALYSIS, ROUTINE W REFLEX MICROSCOPIC (NOT AT [facility])  I-STAT TROPOININ, ED  CBG MONITORING, ED    Imaging Review No results found. I have personally reviewed and evaluated these images and lab results as part of my medical decision-making.   EKG Interpretation None      MDM   Final diagnoses:  Bell's palsy  Keratitis  Herpes zoster    Pt comes in with c/o eye pain, mouth pain and L sided droop.  The pain is x 10 days. Likely is zoster. PCP has prescribed meds.  Pt saw Optho for eye. Zoster in the eye ruled out. L sided droop seen, pt sent to the ER. The facial droop appears to be CN 7 palsy, and likely Bell's palsy. Question is, is he having zoster and Bell's at the same time, or he has zoster which is causing facial palsy. Stroke considered less likely. We called Neurology - and they agree that this is not a central process, and recommends Valtrex (which pcp has already prescribed), Prednisone (which we will add - pt confirms that there is no side effects of hallucination). Add pain meds and artifical tears.  Neuro f/u in 1 week.   Varney Biles, MD 01/10/16 1735

## 2016-01-10 NOTE — ED Notes (Signed)
The pt is c/o pain in his lt eye since 0900am  He has a history of tias.  He saw an eye doctor that told him he possibly had bells palsy  Dr Kathrynn Humble at the bedside

## 2016-01-10 NOTE — ED Notes (Signed)
The pt has been diagnosed with  Shingles in his mouith he has had this pain for 2 weeks

## 2016-01-10 NOTE — Telephone Encounter (Signed)
RX for zoster vac called into [facility] per Philis Fendt PA-C.

## 2016-01-10 NOTE — ED Notes (Signed)
CBG 98  

## 2016-01-10 NOTE — ED Notes (Signed)
Neurologist at the bedside 

## 2016-01-17 ENCOUNTER — Telehealth: Payer: Self-pay | Admitting: Cardiology

## 2016-01-17 ENCOUNTER — Encounter: Payer: Self-pay | Admitting: Internal Medicine

## 2016-01-17 ENCOUNTER — Ambulatory Visit (INDEPENDENT_AMBULATORY_CARE_PROVIDER_SITE_OTHER): Payer: BLUE CROSS/BLUE SHIELD | Admitting: *Deleted

## 2016-01-17 DIAGNOSIS — Z95810 Presence of automatic (implantable) cardiac defibrillator: Secondary | ICD-10-CM

## 2016-01-17 LAB — CUP PACEART INCLINIC DEVICE CHECK
Battery Voltage: 2.98 V
Brady Statistic AP VS Percent: 0.02 %
Brady Statistic RA Percent Paced: 92.15 %
Brady Statistic RV Percent Paced: 99.8 %
HighPow Impedance: 72 Ohm
Implantable Lead Implant Date: 20170313
Implantable Lead Implant Date: 20170313
Implantable Lead Location: 753858
Implantable Lead Location: 753859
Implantable Lead Model: 4598
Implantable Lead Model: 5076
Lead Channel Impedance Value: 1083 Ohm
Lead Channel Impedance Value: 399 Ohm
Lead Channel Impedance Value: 418 Ohm
Lead Channel Impedance Value: 418 Ohm
Lead Channel Impedance Value: 589 Ohm
Lead Channel Impedance Value: 608 Ohm
Lead Channel Impedance Value: 874 Ohm
Lead Channel Impedance Value: 950 Ohm
Lead Channel Pacing Threshold Amplitude: 0.625 V
Lead Channel Pacing Threshold Pulse Width: 0.4 ms
Lead Channel Sensing Intrinsic Amplitude: 2 mV
Lead Channel Sensing Intrinsic Amplitude: 8 mV
Lead Channel Setting Pacing Amplitude: 2.5 V
Lead Channel Setting Pacing Amplitude: 3 V
MDC IDC LEAD IMPLANT DT: 20100112
MDC IDC LEAD LOCATION: 753860
MDC IDC MSMT BATTERY REMAINING LONGEVITY: 70 mo
MDC IDC MSMT LEADCHNL LV IMPEDANCE VALUE: 513 Ohm
MDC IDC MSMT LEADCHNL LV IMPEDANCE VALUE: 589 Ohm
MDC IDC MSMT LEADCHNL LV IMPEDANCE VALUE: 931 Ohm
MDC IDC MSMT LEADCHNL LV IMPEDANCE VALUE: 988 Ohm
MDC IDC MSMT LEADCHNL LV PACING THRESHOLD AMPLITUDE: 1.25 V
MDC IDC MSMT LEADCHNL LV PACING THRESHOLD PULSEWIDTH: 0.4 ms
MDC IDC MSMT LEADCHNL RA PACING THRESHOLD AMPLITUDE: 1.375 V
MDC IDC MSMT LEADCHNL RA PACING THRESHOLD PULSEWIDTH: 0.4 ms
MDC IDC MSMT LEADCHNL RA SENSING INTR AMPL: 2 mV
MDC IDC MSMT LEADCHNL RV IMPEDANCE VALUE: 513 Ohm
MDC IDC SESS DTM: 20170707103204
MDC IDC SET LEADCHNL LV PACING PULSEWIDTH: 0.4 ms
MDC IDC SET LEADCHNL RA PACING AMPLITUDE: 3 V
MDC IDC SET LEADCHNL RV PACING PULSEWIDTH: 0.4 ms
MDC IDC SET LEADCHNL RV SENSING SENSITIVITY: 0.3 mV
MDC IDC STAT BRADY AP VP PERCENT: 92.13 %
MDC IDC STAT BRADY AS VP PERCENT: 7.84 %
MDC IDC STAT BRADY AS VS PERCENT: 0.01 %

## 2016-01-17 NOTE — Telephone Encounter (Signed)
Pt wife called in and stated that pt device alarm tone sounded. Pt wife stated that the device did not shock. Pt blood pressure is 192/110 in right arm 173/111 in the left arm. Pt stated that feels fine, no pain, no rapid heart beat. Instructed pt wife to send a manual transmission w/ pt home monitor. Pt had shingles last week, he then developed herpe's virus and bell's palsy. Pt wife stated that his facing is dropping on one side. Remote transmission received. Call forwarded to RN device tech.

## 2016-01-17 NOTE — Telephone Encounter (Signed)
Reviewed transmission. LV lead impedance alert. LV pacing impedance 988ohms- impedance trend is stable. I have offered for the patient to come in today for reprogramming alert off and increasing impedance upper limit. She is worried about his elevated BP readings (listed in previous note) but reports that he has not taken his medications this morning (including hydralazine and coreg). I have advised her to have Rodney Farley eat some breakfast and take his medications and re-check BP about an hour after taking his medications. She is agreeable. She will bring him to the office around 10am for ICD reprogramming.

## 2016-01-17 NOTE — Progress Notes (Signed)
ICD check in clinic due to alert tone heard this morning. Remote shows LV pacing impedance alert. LV impedance 988ohms today. Impedance trend is stable over time. Max LV pacing impedance increased from 1000 to 1500ohms. Follow up is scheduled with SK 04/03/16 at 2:15pm.

## 2016-01-22 MED FILL — CLOPIDOGREL 75 MG TABLET: 75 | 30 days supply | Qty: 30 | Fill #1

## 2016-01-22 MED FILL — ISOSORBIDE MN ER 30 MG TAB: 30 | 30 days supply | Qty: 30 | Fill #2

## 2016-01-22 MED FILL — PANTOPRAZOLE SOD DR 40 MG T: 40 | 30 days supply | Qty: 30 | Fill #2

## 2016-01-23 ENCOUNTER — Ambulatory Visit (HOSPITAL_COMMUNITY): Payer: BLUE CROSS/BLUE SHIELD | Attending: Internal Medicine

## 2016-01-23 ENCOUNTER — Other Ambulatory Visit: Payer: Self-pay

## 2016-01-23 DIAGNOSIS — I5022 Chronic systolic (congestive) heart failure: Secondary | ICD-10-CM | POA: Diagnosis not present

## 2016-01-23 DIAGNOSIS — E785 Hyperlipidemia, unspecified: Secondary | ICD-10-CM | POA: Insufficient documentation

## 2016-01-23 DIAGNOSIS — Z950 Presence of cardiac pacemaker: Secondary | ICD-10-CM | POA: Diagnosis not present

## 2016-01-23 DIAGNOSIS — I130 Hypertensive heart and chronic kidney disease with heart failure and stage 1 through stage 4 chronic kidney disease, or unspecified chronic kidney disease: Secondary | ICD-10-CM | POA: Diagnosis not present

## 2016-01-23 DIAGNOSIS — J449 Chronic obstructive pulmonary disease, unspecified: Secondary | ICD-10-CM | POA: Diagnosis not present

## 2016-01-23 DIAGNOSIS — E1122 Type 2 diabetes mellitus with diabetic chronic kidney disease: Secondary | ICD-10-CM | POA: Insufficient documentation

## 2016-01-23 DIAGNOSIS — I509 Heart failure, unspecified: Secondary | ICD-10-CM | POA: Insufficient documentation

## 2016-01-23 DIAGNOSIS — N189 Chronic kidney disease, unspecified: Secondary | ICD-10-CM | POA: Diagnosis not present

## 2016-01-23 DIAGNOSIS — Z8249 Family history of ischemic heart disease and other diseases of the circulatory system: Secondary | ICD-10-CM | POA: Insufficient documentation

## 2016-01-23 DIAGNOSIS — G4733 Obstructive sleep apnea (adult) (pediatric): Secondary | ICD-10-CM | POA: Insufficient documentation

## 2016-01-23 DIAGNOSIS — Z6834 Body mass index (BMI) 34.0-34.9, adult: Secondary | ICD-10-CM | POA: Insufficient documentation

## 2016-01-23 DIAGNOSIS — E669 Obesity, unspecified: Secondary | ICD-10-CM | POA: Diagnosis not present

## 2016-01-23 DIAGNOSIS — I2510 Atherosclerotic heart disease of native coronary artery without angina pectoris: Secondary | ICD-10-CM | POA: Diagnosis not present

## 2016-01-23 DIAGNOSIS — Z87891 Personal history of nicotine dependence: Secondary | ICD-10-CM | POA: Diagnosis not present

## 2016-01-23 DIAGNOSIS — I255 Ischemic cardiomyopathy: Secondary | ICD-10-CM | POA: Insufficient documentation

## 2016-01-28 MED FILL — ERYTHROMYCIN EYE OINTMENT: 5 | 10 days supply | Qty: 4 | Fill #0

## 2016-01-29 MED FILL — FUROSEMIDE 40 MG TABLET: 40 | 30 days supply | Qty: 60 | Fill #2

## 2016-02-03 MED FILL — SERTRALINE HCL 100 MG TAB: 100 | 90 days supply | Qty: 135 | Fill #1

## 2016-02-03 MED FILL — CARVEDILOL 12.5 MG TABLET: 12.5 | 30 days supply | Qty: 60 | Fill #2

## 2016-02-05 MED FILL — ERYTHROMYCIN EYE OINTMENT: 5 | 10 days supply | Qty: 4 | Fill #1

## 2016-02-12 MED FILL — ATORVASTATIN 40 MG TABLET: 40 | 90 days supply | Qty: 90 | Fill #3 | Status: TO

## 2016-02-19 MED FILL — hydrALAZINE HCL 25 MG TABS: 25 | 30 days supply | Qty: 90 | Fill #7

## 2016-02-19 MED FILL — LANTUS SOLOSTAR 100 UNITS/M: 100 | 75 days supply | Qty: 45 | Fill #1 | Status: TO

## 2016-02-24 MED FILL — CLOPIDOGREL 75 MG TABLET: 75 | 30 days supply | Qty: 30 | Fill #2

## 2016-02-25 MED FILL — PANTOPRAZOLE SOD DR 40 MG T: 40 | 30 days supply | Qty: 30 | Fill #3

## 2016-02-27 MED FILL — ISOSORBIDE MN ER 30 MG TAB: 30 | 30 days supply | Qty: 30 | Fill #3

## 2016-02-28 MED FILL — ERYTHROMYCIN EYE OINTMENT: 5 | 10 days supply | Qty: 4 | Fill #2

## 2016-03-03 ENCOUNTER — Other Ambulatory Visit: Payer: Self-pay

## 2016-03-03 NOTE — Patient Outreach (Signed)
[facility]) Care Management   03/03/2016  CEM COLUCCI December 21, 1931 [id number]  Rodney Farley is an 82 y.o. male.   Member seen for follow up office visit for Link to Wellness program for self management of Type 2 diabetes  Subjective: Wife states that she is no longer working for [employer] and they have not had the [employer] since June.  States that member has insurance from [place] care.  Member states he is trying to recover from an eye infection from the shingles and cataract surgery in his lt eye.  States his blood sugars have been good and his reading was 112 this AM  Objective:   ROS  Physical Exam Today's Vitals   03/03/16 1458 03/03/16 1527  BP: 130/72   Pulse: 72   Resp: 16   SpO2: 94%   Weight: 222 lb 12.8 oz (101.1 kg)   Height: 1.702 m (5\' 7" )   PainSc: 8  8    Encounter Medications:   Outpatient Encounter Prescriptions as of 03/03/2016  Medication Sig Note  . aspirin EC 81 MG tablet Take 81 mg by mouth daily.   [name] atorvastatin (LIPITOR) 40 MG tablet Take 1 tablet (40 mg total) by mouth daily.   . bacitracin ophthalmic ointment Place 1 application into both eyes at bedtime. Apply to eyelids 09/18/2015: Received from: External Pharmacy Received Sig:   . carvedilol (COREG) 12.5 MG tablet Take 1 tablet (12.5 mg total) by mouth 2 (two) times daily with a meal.   . clopidogrel (PLAVIX) 75 MG tablet TAKE 1 TABLET BY MOUTH DAILY WITH BREAKFAST   . colchicine 0.6 MG tablet Take 0.5 tablets (0.3 mg total) by mouth 3 (three) times daily.   . Eyelid Cleansers (AVENOVA) 0.01 % SOLN Apply 1 application topically 2 (two) times daily as needed.   . furosemide (LASIX) 20 MG tablet Take 1.5 tablets (30 mg total) by mouth daily. Take 1 and 1/2 tablets 30mg  daily   . hydrALAZINE (APRESOLINE) 25 MG tablet Take 1 tablet (25 mg total) by mouth 2 (two) times daily.   [name] HYDROcodone-acetaminophen (NORCO/VICODIN) 5-325 MG tablet Take 1 tablet by mouth every 6 (six) hours as  needed.   . hydroxypropyl methylcellulose / hypromellose (ISOPTO TEARS / GONIOVISC) 2.5 % ophthalmic solution Place 1 drop into the left eye 4 (four) times daily as needed for dry eyes.   . isosorbide mononitrate (IMDUR) 30 MG 24 hr tablet Take 1 tablet (30 mg total) by mouth daily.   [name] LANTUS SOLOSTAR 100 UNIT/ML Solostar Pen INJECT 60 UNITS UNDER THE SKIN ONCE DAILY   . lidocaine (XYLOCAINE) 2 % solution Use as directed 15 mLs in the mouth or throat as needed for mouth pain. Do not swallow.   [name] NITROSTAT 0.4 MG SL tablet PLACE 1 TABLET (0.4 MG TOTAL) UNDER THE TONGUE EVERY 5 (FIVE) MINUTES X 3 DOSES AS NEEDED FOR CHEST PAIN.   [name] OVER THE COUNTER MEDICATION Place 1 drop into both eyes 3 (three) times daily. OTC Bausch-Lomb eye bath   . pantoprazole (PROTONIX) 40 MG tablet Take 1 tablet (40 mg total) by mouth daily.   . predniSONE (DELTASONE) 10 MG tablet Take 6 tablets (60 mg total) by mouth daily.   . sertraline (ZOLOFT) 100 MG tablet Take 1.5 tablets (150 mg total) by mouth daily.   . tamsulosin (FLOMAX) 0.4 MG CAPS capsule Take 0.4 mg by mouth daily after supper.   [name] UNIFINE PENTIPS 31G X 5 MM MISC  USE AS DIRECTED   . valACYclovir (VALTREX) 1000 MG tablet Take 1 tablet (1,000 mg total) by mouth 2 (two) times daily.   . vitamin B-12 (CYANOCOBALAMIN) 1000 MCG tablet Take 1,000 mcg by mouth daily.    No facility-administered encounter medications on file as of 03/03/2016.     Functional Status:   In your present state of health, do you have any difficulty performing the following activities: 10/14/2015 09/24/2015  Hearing? Tempie Donning  Vision? Y Y  Difficulty concentrating or making decisions? N N  Walking or climbing stairs? Y N  Dressing or bathing? N N  Doing errands, shopping? N N  Some recent data might be hidden    Fall/Depression Screening:    PHQ 2/9 Scores 01/10/2016 10/14/2015 09/25/2015 07/08/2015 06/26/2015 06/24/2015 05/31/2015  PHQ - 2 Score 0 1 0 0 0 1 4  PHQ- 9 Score - - - - - - 11     Assessment:  Member is no longer covered by [employer] and is not eligible for the Link to [employer].  Member now has coverage from the open marketplace.    Plan:  Plan to contact primary provider to schedule follow up visit Plan to close case as member is no longer eligible for services Jerimey Garter RN, [facility] Care Management Coordinator-Link to [place] [facility] [phone]

## 2016-03-04 MED FILL — FUROSEMIDE 40 MG TABLET: 40 | 30 days supply | Qty: 60 | Fill #3

## 2016-03-09 ENCOUNTER — Other Ambulatory Visit: Payer: Self-pay

## 2016-03-09 MED ORDER — TAMSULOSIN HCL 0.4 MG PO CAPS: 0.4000 mg | ORAL_CAPSULE | Freq: Every day | ORAL | 0 refills | Status: DC

## 2016-03-09 MED FILL — CARVEDILOL 12.5 MG TABLET: 12.5 | 30 days supply | Qty: 60 | Fill #3

## 2016-03-09 MED FILL — TAMSULOSIN HCL 0.4 MG CAP: 0.4 | 30 days supply | Qty: 30 | Fill #0

## 2016-03-12 ENCOUNTER — Telehealth: Payer: Self-pay

## 2016-03-12 NOTE — Telephone Encounter (Signed)
Patient's wife Raylene called stating she need to be her husband's caregiver. He is not able to care for his self anymore. Please contact wife at [phone].

## 2016-03-13 NOTE — Telephone Encounter (Signed)
Left message for pt to call back  °

## 2016-03-14 ENCOUNTER — Telehealth: Payer: Self-pay | Admitting: Family Medicine

## 2016-03-14 NOTE — Telephone Encounter (Signed)
Pt calling to speak with tamara will call back Tuesday to speak with her

## 2016-03-17 NOTE — Telephone Encounter (Signed)
Attempted to answer message, but a lot of static noted.

## 2016-03-18 NOTE — Telephone Encounter (Signed)
Rodney Farley called back and stated that pt is unable to complete ADLs for himself and is stumbling a lot. He can no longer take care of himself. Wife is 82 yo and is trying to retire and collect Soc Sec early in order to take care of him. She needs something documented from MD stating that she needs to become caregiver. I advised pt that she will need to bring pt in for OV to est care w/new provider in order to do this and may need to fill out a form for SS. Transferred wife to operator to sch appt.

## 2016-03-18 NOTE — Telephone Encounter (Signed)
Duplicate message. 

## 2016-03-26 ENCOUNTER — Encounter: Payer: Self-pay | Admitting: Family Medicine

## 2016-03-26 ENCOUNTER — Ambulatory Visit (INDEPENDENT_AMBULATORY_CARE_PROVIDER_SITE_OTHER): Payer: PPO | Admitting: Family Medicine

## 2016-03-26 VITALS — BP 150/88 | HR 78 | Temp 98.2°F | Resp 18 | Ht 67.0 in | Wt 222.0 lb

## 2016-03-26 DIAGNOSIS — Z23 Encounter for immunization: Secondary | ICD-10-CM

## 2016-03-26 DIAGNOSIS — R29810 Facial weakness: Secondary | ICD-10-CM

## 2016-03-26 DIAGNOSIS — F329 Major depressive disorder, single episode, unspecified: Secondary | ICD-10-CM

## 2016-03-26 DIAGNOSIS — F32A Depression, unspecified: Secondary | ICD-10-CM

## 2016-03-26 DIAGNOSIS — E1122 Type 2 diabetes mellitus with diabetic chronic kidney disease: Secondary | ICD-10-CM

## 2016-03-26 DIAGNOSIS — Z9181 History of falling: Secondary | ICD-10-CM

## 2016-03-26 DIAGNOSIS — Z794 Long term (current) use of insulin: Secondary | ICD-10-CM

## 2016-03-26 DIAGNOSIS — R2681 Unsteadiness on feet: Secondary | ICD-10-CM

## 2016-03-26 LAB — GLUCOSE, POCT (MANUAL RESULT ENTRY): POC Glucose: 98 mg/dl (ref 70–99)

## 2016-03-26 LAB — POCT GLYCOSYLATED HEMOGLOBIN (HGB A1C): HEMOGLOBIN A1C: 6.8

## 2016-03-26 NOTE — Progress Notes (Signed)
This encounter was created in error - please disregard.

## 2016-03-26 NOTE — Patient Instructions (Addendum)
I will refer you to neurology, but ultimately may need have a speech therapy evaluation for swallowing, as well as home health safety evaluation. Bells should not cause the difficulty with speech, so CT head and neurology evaluation ordered.   I ordered a CT scan ito make sure there has not been a recent stroke, but face weakness may be still due to Bell's palsy. Continue follow-up with ophthalmology for your eye pain, and if Dr. Katy Fitch has any recommendations for me in treating this pain, please have him call me or I can talk to him as well. Recheck with me in 1 week to discuss further workup of these issues and next step. Return to the clinic or go to the nearest emergency room if any of your symptoms worsen or new symptoms occur.    IF you received an x-ray today, you will receive an invoice from [facility]. Please contact [facility] at [phone] with questions or concerns regarding your invoice.   IF you received labwork today, you will receive an invoice from [employer]. Please contact [facility] at [phone] with questions or concerns regarding your invoice.   Our billing staff will not be able to assist you with questions regarding bills from these companies.  You will be contacted with the lab results as soon as they are available. The fastest way to get your results is to activate your My Chart account. Instructions are located on the last page of this paperwork. If you have not heard from [place] regarding the results in 2 weeks, please contact this office.

## 2016-03-26 NOTE — Progress Notes (Signed)
Subjective:  By signing my name below, I, [name], attest that this documentation has been prepared under the direction and in the presence of Merri Ray, MD. Electronically Signed: [name], [place]. 03/26/2016 , 2:05 PM .  Patient was seen in Room 2 .   Patient ID: Rodney Farley, male    DOB: 01-30-1932, 82 y.o.   MRN: [id number] Chief Complaint  Patient presents with  . Establish Care    pt wants to discuss ADL  . Flu Vaccine  . Positive PHQ   HPI Rodney Farley is a 82 y.o. male Previous patient of Dr. [name]. Here to establish care. H/o multiple medication problems including CAD, complete heart block (followed by cardiologist, Dr. Caryl Comes), type 2 diabetes insulin dependent, chronic kidney disease, and CHF. Telephone note from Aug 31st, he was having troubles with ADL's stumbling more. His wife was considering retiring early to take care of him. History of depression as well. Telephone note from January 13, 2023 noted death of sister on Dec 25, 2022 which increased his depression, so increased his zoloft to 200mg  max. His last office visit with Dr. [name] was March this year; was having some hypotensive symptoms at that time. His last A1c was 6.9 in Dec 2016. He had TIA in the past. His last CT head scan in Nov 2016, showed chronic atrophic ischemic changes, no acute abnormalities.   Patient was seen by Philis Fendt, PA-C on June 30th, diagnosed him for shingles. He was given valtrex. He was seen by Dr. Katy Fitch later that day who manages his eye symptoms. He was also seen in the ED later that day (June 30th), notes no zoster in eye, looked to be Bell's palsy - less likely stroke, neurology was consulted, but did not think it was central process; wanted to follow up with neurologist in a week. He's had some trouble forming words and stuttering since then as well.   His wife has filed for early retirement and filing to be his home care person. He's had developed shingles over his  left eye into Bell's palsy about 6 weeks ago. He hasn't been able to chew well and eating mostly soft foods. He denies seeing neurologist. He denies having speech therapy done. He has constant pain in his left eye and weakness in his left jaw. He's planning to see Dr. Katy Fitch in 4 days. His wife previously worked as a Pharmacologist in [place] for [employer].   Functional Status Survey: Is the patient deaf or have difficulty hearing?: Yes Does the patient have difficulty seeing, even when wearing glasses/contacts?: Yes (l eye pain and decreased vision, suspected Bells) Does the patient have difficulty concentrating, remembering, or making decisions?: Yes Does the patient have difficulty walking or climbing stairs?: Yes (stumbling with walkling for 2 years. ) Does the patient have difficulty dressing or bathing?: Yes (requires assistance of wife to bathe properly and dressing. ) Does the patient have difficulty doing errands alone such as visiting a doctor's office or shopping?: Yes  He also requires assistance in the bathroom occasionally.   He's rolled out of his bed more often. In 1 instance 3 weeks ago, he rolled out of bed and bumped his left shoulder, as well as bruising his left knee.   He's been declining motivation and stability over the past 2 years due to multiple losses. He's been taking zoloft 100mg  bid, but he's still feeling rough. He denies meeting a counselor or a therapist about his depression. He  denies SI, HI or self-injury. Over the course of 14 months, he's lost a close friend, an uncle, a sister, and a brother-in-law.   DM He's been followed by Dr. [name] for his diabetes. He checks his sugars but hasn't recorded them. He denies any sugar readings below 60, and nothing above 300. He takes 60 units of lantus a day.   Patient Active Problem List   Diagnosis Date Noted  . Congestive heart failure, NYHA class III ([place]) 09/23/2015  . Gout 06/26/2015  . AKI (acute  kidney injury) ([place])   . Left arm pain 05/08/2015  . Left hand pain 05/08/2015  . Chronic systolic CHF (congestive heart failure) ([place]) 04/08/2015  . Diverticulitis 04/08/2015  . Bilateral edema of lower extremity 03/29/2015  . DOE (dyspnea on exertion), due to significant CAD 03/28/2015  . CKD (chronic kidney disease) stage 4, GFR 15-29 ml/min (HCC) 03/04/2015  . Unstable angina ([place]) 10/16/2014  . Acute renal failure superimposed on stage 4 chronic kidney disease ([place]) 10/16/2014  . Dyspnea on exertion 09/13/2014  . CAD (coronary artery disease), native coronary artery, in all vessels, significant 09/13/2014  . GERD (gastroesophageal reflux disease) 08/07/2012  . Renal insufficiency 08/07/2012  . Depression 08/05/2012  . Sleep apnea 08/05/2012  . Diabetes type 2, uncontrolled ([place]) 08/05/2012  . HTN (hypertension) 08/05/2012  . Ischemic cardiomyopathy 11/03/2011  . Atrioventricular block, complete ([place])   . Obesity   . Pacemaker    Past Medical History:  Diagnosis Date  . Anginal pain ([place])   . Anxiety   . Arm pain 05/08/2015   LEFT ARM  . Atrioventricular block, complete ([place])    a. 2010 s/p pacemaker.  . Chronic systolic CHF (congestive heart failure) ([place])    a. LVEF 35-40% by echo 09/2014.  [name] CKD (chronic kidney disease), stage IV ([place])   . COPD (chronic obstructive pulmonary disease) (H[facility])    Severe  . Coronary artery disease    a. s/p MI in 1994/1995 while in [place] s/p questionable PCI. 03/2015: progression of disease, for staged PCI.  [name] Depression   . Diabetic peripheral neuropathy ([place])   . DOE (dyspnea on exertion) 03/28/2015  . GERD (gastroesophageal reflux disease)   . Hypercholesterolemia   . Hypertension   . MI (myocardial infarction) ([place]) 1994; 1995  . Neuropathy (HCC)    IN LOWER EXTREMITIES  . Obesity   . Pacemaker    medtronic>>> MDT ICD 09/23/15  . Sleep apnea    "sleeps w/humidifyer when he panics and gets short of breath" (04/08/2015)  . TIA  (transient ischemic attack) X 3  . Type II diabetes mellitus ([place])    Past Surgical History:  Procedure Laterality Date  . CARDIAC CATHETERIZATION N/A 03/29/2015   Procedure: Right/Left Heart Cath and Coronary Angiography;  Surgeon: Naod M [name], MD;  Location: [facility];  Service: Cardiovascular;  Laterality: N/A;  . [place]   "after my MI; put me on heart RX after cath"  . CARDIAC CATHETERIZATION N/A 04/09/2015   Procedure: Coronary Stent Intervention;  Surgeon: Jermie M [name], MD;  Location: [facility];  Service: Cardiovascular;  Laterality: N/A;  . CHOLECYSTECTOMY OPEN  ~ 1977  . EP IMPLANTABLE DEVICE N/A 09/23/2015   MDT CRT-D, Dr. Caryl Comes  . HIATAL HERNIA REPAIR  1977  . INSERT / REPLACE / REMOVE PACEMAKER  07/2008   Complete heart block status post DDD with good function  . TONSILLECTOMY     Allergies  Allergen Reactions  . Bee Venom Anaphylaxis  . Lyrica [Pregabalin] Other (See Comments)    hallucinations  . Prednisone Other (See Comments)    hallucinations  . Zocor [Simvastatin] Nausea Only and Other (See Comments)    Headache with brand name only.  Can take the generic.   Prior to Admission medications   Medication Sig Start Date End Date Taking? Authorizing Provider  aspirin EC 81 MG tablet Take 81 mg by mouth daily.    Historical Provider, MD  atorvastatin (LIPITOR) 40 MG tablet Take 1 tablet (40 mg total) by mouth daily. 04/19/15   [name], MD  bacitracin ophthalmic ointment Place 1 application into both eyes at bedtime. Apply to eyelids 08/23/15   Historical Provider, MD  carvedilol (COREG) 12.5 MG tablet Take 1 tablet (12.5 mg total) by mouth 2 (two) times daily with a meal. 07/09/15   Hanif M [name], MD  clopidogrel (PLAVIX) 75 MG tablet TAKE 1 TABLET BY MOUTH DAILY WITH BREAKFAST 12/26/15   Steven M [name], MD  colchicine 0.6 MG tablet Take 0.5 tablets (0.3 mg total) by mouth 3 (three) times daily. 06/18/15   [name], MD  Eyelid Cleansers (AVENOVA) 0.01 % SOLN Apply 1 application topically 2 (two) times daily as needed.    Historical Provider, MD  furosemide (LASIX) 20 MG tablet Take 1.5 tablets (30 mg total) by mouth daily. Take 1 and 1/2 tablets 30mg  daily 09/24/15   Baldwin Jamaica, PA-C  hydrALAZINE (APRESOLINE) 25 MG tablet Take 1 tablet (25 mg total) by mouth 2 (two) times daily. 10/03/15   Bryann M [name], MD  HYDROcodone-acetaminophen (NORCO/VICODIN) 5-325 MG tablet Take 1 tablet by mouth every 6 (six) hours as needed. 01/10/16   Varney Biles, MD  hydroxypropyl methylcellulose / hypromellose (ISOPTO TEARS / GONIOVISC) 2.5 % ophthalmic solution Place 1 drop into the left eye 4 (four) times daily as needed for dry eyes. 01/10/16   Varney Biles, MD  isosorbide mononitrate (IMDUR) 30 MG 24 hr tablet Take 1 tablet (30 mg total) by mouth daily. 11/28/15   Elam M [name], MD  LANTUS SOLOSTAR 100 UNIT/ML Solostar Pen INJECT 60 UNITS UNDER THE SKIN ONCE DAILY 11/09/15   [name], MD  lidocaine (XYLOCAINE) 2 % solution Use as directed 15 mLs in the mouth or throat as needed for mouth pain. Do not swallow. 01/10/16   Tereasa Coop, PA-C  NITROSTAT 0.4 MG SL tablet PLACE 1 TABLET (0.4 MG TOTAL) UNDER THE TONGUE EVERY 5 (FIVE) MINUTES X 3 DOSES AS NEEDED FOR CHEST PAIN. 11/05/15   Deboraha Sprang, MD  OVER THE COUNTER MEDICATION Place 1 drop into both eyes 3 (three) times daily. OTC [place] eye bath    Historical Provider, MD  pantoprazole (PROTONIX) 40 MG tablet Take 1 tablet (40 mg total) by mouth daily. 11/28/15   Kylan M [name], MD  predniSONE (DELTASONE) 10 MG tablet Take 6 tablets (60 mg total) by mouth daily. 01/10/16   Varney Biles, MD  sertraline (ZOLOFT) 100 MG tablet Take 1.5 tablets (150 mg total) by mouth daily. 04/11/15   [name], MD  tamsulosin (FLOMAX) 0.4 MG CAPS capsule Take 1 capsule (0.4 mg total) by mouth daily after supper. 03/09/16   Jaynee Eagles, PA-C  UNIFINE PENTIPS  31G X 5 MM MISC USE AS DIRECTED 09/28/15   [name], MD  valACYclovir (VALTREX) 1000 MG tablet Take 1 tablet (1,000 mg total) by mouth 2 (two) times  daily. 01/10/16   Tereasa Coop, PA-C  vitamin B-12 (CYANOCOBALAMIN) 1000 MCG tablet Take 1,000 mcg by mouth daily.    Historical Provider, MD   Social History   Social History  . Marital status: Married    Spouse name: N/A  . Number of children: 1  . Years of education: N/A   Occupational History  . Not on file.   Social History Main Topics  . Smoking status: Former Smoker    Packs/day: 1.50    Years: 54.00    Types: Cigarettes    Quit date: 07/18/2007  . Smokeless tobacco: Never Used  . Alcohol use Yes     Comment: 04/08/2015 "probably 3 drinks/month"  . Drug use: No  . Sexual activity: No   Other Topics Concern  . Not on file   Social History Narrative  . No narrative on file   Review of Systems  Eyes: Positive for pain.  Musculoskeletal: Positive for arthralgias.  Skin: Negative for rash and wound.  Neurological: Positive for tremors, facial asymmetry, speech difficulty and weakness.  Psychiatric/Behavioral: Positive for confusion. Negative for dysphoric mood, self-injury and suicidal ideas. The patient is nervous/anxious.        Objective:   Physical Exam  Constitutional: He is oriented to person, place, and time. He appears well-developed and well-nourished. No distress.  HENT:  Head: Normocephalic and atraumatic.  Left Ear: Ear canal normal.  Patch over left eye, which is not removed for exam, face without rash  Eyes: EOM are normal. Pupils are equal, round, and reactive to light.  Neck: Neck supple.  Cardiovascular: Normal rate.   Pulmonary/Chest: Effort normal and breath sounds normal. No respiratory distress.  Musculoskeletal: Normal range of motion.  Neurological: He is alert and oriented to person, place, and time.  Minimal facial movement on left with smile, air leakage when trying to puff cheeks  on left side, minimal left eye brow raise, able to move tongue equally; able to stand on his own with some unsteadiness upon standing, unsteady requiring assistance at one point, short step wide based gait  Skin: Skin is warm and dry.  Psychiatric: He has a normal mood and affect. His behavior is normal.  Nursing note and vitals reviewed.   Vitals:   03/26/16 1259 03/26/16 1313  BP: (!) 186/86 (!) 150/88  Pulse: 78   Resp: 18   Temp: 98.2 F (36.8 C)   SpO2: 95%   Weight: 222 lb (100.7 kg)   Height: 5\' 7"  (1.702 m)    Results for orders placed or performed in visit on 03/26/16  POCT glucose (manual entry)  Result Value Ref Range   POC Glucose 98 70 - 99 mg/dl  POCT glycosylated hemoglobin (Hb A1C)  Result Value Ref Range   Hemoglobin A1C 6.8        Assessment & Plan:   Rodney Farley is a 82 y.o. male Need for influenza vaccination - Plan: Flu Vaccine QUAD 36+ mos IM given  Facial droop - Plan: Ambulatory referral to Neurology, CT Head Wo Contrast Facial weakness - Plan: Ambulatory referral to Neurology, CT Head Wo Contrast Unsteadiness - Plan: Ambulatory referral to Neurology, CT Head Wo Contrast History of fall - Plan: Ambulatory referral to Neurology, CT Head Wo Contrast  -by hx appears to have been progressive difficulty with unsteadiness, but now with L facial droop/weakness. Thought to have been due to Bells palsy, but with some difficulty with speech at times, will check CT head and  eval with neurology.   - may need home health safety eval and speech therapy eval for swallowing study, but this may be able to be ordered at neuro.  I can also order these if necessary. Follow up in 1 week.   Type 2 diabetes mellitus with chronic kidney disease, with long-term current use of insulin, unspecified CKD stage ([place]) - Plan: POCT glucose (manual entry), POCT glycosylated hemoglobin (Hb A1C)  - appears controlled and denies hypoglycemic sx's. No change in regimen for now.    Depression - Plan: Ambulatory referral to Psychiatry  - cont zoloft same dose for now, refer to psych for further eval.   May also benefit form counseling   Letter provided for spouse out of work as significant ADL difficulty at home. Can continue to discuss home needs at next visit to see if other resources available.   No orders of the defined types were placed in this encounter.  Patient Instructions   I will refer you to neurology, but ultimately may need have a speech therapy evaluation for swallowing, as well as home health safety evaluation. Bells should not cause the difficulty with speech, so CT head and neurology evaluation ordered.   I ordered a CT scan ito make sure there has not been a recent stroke, but face weakness may be still due to Bell's palsy. Continue follow-up with ophthalmology for your eye pain, and if Dr. Katy Fitch has any recommendations for me in treating this pain, please have him call me or I can talk to him as well. Recheck with me in 1 week to discuss further workup of these issues and next step. Return to the clinic or go to the nearest emergency room if any of your symptoms worsen or new symptoms occur.    IF you received an x-ray today, you will receive an invoice from [facility]. Please contact [facility] at [phone] with questions or concerns regarding your invoice.   IF you received labwork today, you will receive an invoice from [employer]. Please contact [facility] at [phone] with questions or concerns regarding your invoice.   Our billing staff will not be able to assist you with questions regarding bills from these companies.  You will be contacted with the lab results as soon as they are available. The fastest way to get your results is to activate your My Chart account. Instructions are located on the last page of this paperwork. If you have not heard from [place] regarding the results in 2 weeks,  please contact this office.        I personally performed the services described in this documentation, which was scribed in my presence. The recorded information has been reviewed and considered, and addended by me as needed.   Signed,   Merri Ray, MD [facility].  03/28/16 12:27 PM

## 2016-03-30 ENCOUNTER — Encounter: Payer: Self-pay | Admitting: Neurology

## 2016-03-30 ENCOUNTER — Ambulatory Visit (INDEPENDENT_AMBULATORY_CARE_PROVIDER_SITE_OTHER): Payer: BLUE CROSS/BLUE SHIELD | Admitting: Neurology

## 2016-03-30 ENCOUNTER — Telehealth: Payer: Self-pay

## 2016-03-30 VITALS — BP 142/62 | HR 76 | Resp 20 | Ht 67.0 in | Wt 223.0 lb

## 2016-03-30 DIAGNOSIS — H5712 Ocular pain, left eye: Secondary | ICD-10-CM

## 2016-03-30 DIAGNOSIS — G510 Bell's palsy: Secondary | ICD-10-CM | POA: Diagnosis not present

## 2016-03-30 NOTE — Telephone Encounter (Signed)
Dr. Katy Fitch is calling to see if Dr. Nyoka Cowden agrees if CT scan would be a good idea. He states it's nothing urgent but would like to discuss it.  [phone]

## 2016-03-30 NOTE — Progress Notes (Signed)
Subjective:    Patient ID: Rodney Farley is a 82 y.o. male.  HPI     Rodney Age, MD, PhD [facility] [address] [place], [place] [address]  Dear Dr. Carlota Farley,   I saw your patient, Rodney Farley, upon your kind request in my neurologic clinic today for initial consultation of his left sided facial weakness, history of Bell's palsy. The patient is accompanied by his wife today, who joined [place] later. 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 He has had pain in the L eye and blisters ("hundreds of them") in his mouth on the L, not in the face. He had a dilated eye exam today and has a 2 week FU, consideration of suturing the eyelids on the L for protection.  Currently he is patching the L eye, puts ointment in it at night. He has throbbing pain in the L eyeball, not like grittiness in the eye, no foreign body sensation, more of a pulsating pain. Mouth pain has resolved.  He feels very socially impaired with the ongoing L facial droop, has had some speech difficulty, d/t droopy L mouth. He avoids going out, has not been to his church even, spends more time in bed. He has been on Zoloft. He used to see Dr. Erling Farley in the past for TIA and was started on sertraline by him he states.   Of note, he went to the emergency room on 01/10/2016 with new onset left facial weakness. He was suspected to have shingles and also saw his ophthalmologist who advised him that he did not have shingles.   He says he had a sleep study in the past, reports, he has no sleep apnea.   Of note, he had a head CT without contrast the cervical spine CT without contrast on  05/14/2015 after a fall and I reviewed the results:   IMPRESSION: CT of the head: Chronic atrophic and ischemic changes without acute abnormality.   CT of cervical spine: Degenerative changes without acute abnormality.  He also had a head CT without contrast the cervical spine CT without contrast on 05/08/2015 secondary to acute bilateral arm pain and I reviewed the results:   IMPRESSION: 1. No acute intracranial pathology seen on CT. 2. Mild cortical volume loss and scattered small vessel ischemic microangiopathy. I reviewed your office note from 03/26/2016. You ordered a Brooke wo contrast, which is currently pending. He cannot have an MRI because of his pacemaker. He cannot safely get contrast because of kidney impairment. He has had neuropathy for years. His wife reports that he has had more stumbling. Of note he uses a cane for safety at times, sometimes holds onto his wife. She recently retired.  His Past Medical History Is Significant For: Past Medical History:  Diagnosis Date  . Anginal pain ([place])   . Anxiety   . Arm pain 05/08/2015   LEFT ARM  . Atrioventricular block, complete ([place])    a. 2010 s/p pacemaker.  . Chronic systolic CHF (congestive heart failure) ([place])    a. LVEF 35-40% by echo 09/2014.  [name] CKD (chronic kidney disease), stage IV ([place])   . COPD (chronic obstructive pulmonary disease) ([facility])    Severe  .  Coronary artery disease    a. s/p MI in 1994/1995 while in [place] s/p questionable PCI. 03/2015: progression of disease, for staged PCI.  [name] Depression   . Diabetic peripheral neuropathy ([place])   . DOE (dyspnea on exertion) 03/28/2015  . GERD (gastroesophageal reflux disease)   . Hypercholesterolemia   . Hypertension   . MI (myocardial infarction) ([place]) 1994; 1995  . Neuropathy ([facility])    IN LOWER EXTREMITIES  . Obesity   . Pacemaker    medtronic>>> MDT ICD 09/23/15  . Sleep apnea    "sleeps w/humidifyer when he panics and gets short of breath" (04/08/2015)  .  TIA (transient ischemic attack) X 3  . Type II diabetes mellitus ([place])     His Past Surgical History Is Significant For: Past Surgical History:  Procedure Laterality Date  . CARDIAC CATHETERIZATION N/A 03/29/2015   Procedure: Right/Left Heart Cath and Coronary Angiography;  Surgeon: Toryn M [name], MD;  Location: [facility];  Service: Cardiovascular;  Laterality: N/A;  . [place]   "after my MI; put me on heart RX after cath"  . CARDIAC CATHETERIZATION N/A 04/09/2015   Procedure: Coronary Stent Intervention;  Surgeon: Harveer M [name], MD;  Location: [facility];  Service: Cardiovascular;  Laterality: N/A;  . CHOLECYSTECTOMY OPEN  ~ 1977  . EP IMPLANTABLE DEVICE N/A 09/23/2015   MDT CRT-D, Dr. Caryl Comes  . HIATAL HERNIA REPAIR  1977  . INSERT / REPLACE / REMOVE PACEMAKER  07/2008   Complete heart block status post DDD with good function  . TONSILLECTOMY      His Family History Is Significant For: Family History  Problem Relation Farley of Onset  . Stroke Mother   . Leukemia Father   . Stroke Sister   . Heart attack Brother     His Social History Is Significant For: Social History   Social History  . Marital status: Married    Spouse name: N/A  . Number of children: 1  . Years of education: college   Occupational History  . Retired    Social History Farley Topics  . Smoking status: Former Smoker    Packs/day: 1.50    Years: 54.00    Types: Cigarettes    Quit date: 07/18/2007  . Smokeless tobacco: Never Used  . Alcohol use No     Comment: 04/08/2015 "probably 3 drinks/month"  . Drug use: No  . Sexual activity: No   Other Topics Concern  . None   Social History Narrative   Patient drinks 1 pot of coffee a day     His Allergies Are:  Allergies  Allergen Reactions  . Bee Venom Anaphylaxis  . Lyrica [Pregabalin] Other (See Comments)    hallucinations  . Prednisone Other (See Comments)    hallucinations  . Zocor [Simvastatin] Nausea  Only and Other (See Comments)    Headache with brand name only.  Can take the generic.  :   His Current Medications Are:  Outpatient Encounter Prescriptions as of 03/30/2016  Medication Sig  . aspirin EC 81 MG tablet Take 81 mg by mouth daily.  [name] atorvastatin (LIPITOR) 40 MG tablet Take 1 tablet (40 mg total) by mouth daily.  . bacitracin ophthalmic ointment Place 1 application into both eyes at bedtime. Apply to eyelids  . carvedilol (COREG) 12.5 MG tablet Take 1 tablet (12.5 mg total) by mouth 2 (two) times daily with a meal.  . clopidogrel (PLAVIX) 75 MG tablet TAKE  1 TABLET BY MOUTH DAILY WITH BREAKFAST  . colchicine 0.6 MG tablet Take 0.5 tablets (0.3 mg total) by mouth 3 (three) times daily.  . Eyelid Cleansers (AVENOVA) 0.01 % SOLN Apply 1 application topically 2 (two) times daily as needed.  . furosemide (LASIX) 20 MG tablet Take 1.5 tablets (30 mg total) by mouth daily. Take 1 and 1/2 tablets 30mg  daily  . hydrALAZINE (APRESOLINE) 25 MG tablet Take 1 tablet (25 mg total) by mouth 2 (two) times daily.  . hydroxypropyl methylcellulose / hypromellose (ISOPTO TEARS / GONIOVISC) 2.5 % ophthalmic solution Place 1 drop into the left eye 4 (four) times daily as needed for dry eyes.  . isosorbide mononitrate (IMDUR) 30 MG 24 hr tablet Take 1 tablet (30 mg total) by mouth daily.  [name] LANTUS SOLOSTAR 100 UNIT/ML Solostar Pen INJECT 60 UNITS UNDER THE SKIN ONCE DAILY  . NITROSTAT 0.4 MG SL tablet PLACE 1 TABLET (0.4 MG TOTAL) UNDER THE TONGUE EVERY 5 (FIVE) MINUTES X 3 DOSES AS NEEDED FOR CHEST PAIN.  [name] OVER THE COUNTER MEDICATION Place 1 drop into both eyes 3 (three) times daily. OTC Bausch-Lomb eye bath  . pantoprazole (PROTONIX) 40 MG tablet Take 1 tablet (40 mg total) by mouth daily.  . sertraline (ZOLOFT) 100 MG tablet Take 1.5 tablets (150 mg total) by mouth daily.  . tamsulosin (FLOMAX) 0.4 MG CAPS capsule Take 1 capsule (0.4 mg total) by mouth daily after supper.  [name] UNIFINE PENTIPS 31G X 5  MM MISC USE AS DIRECTED  . vitamin B-12 (CYANOCOBALAMIN) 1000 MCG tablet Take 1,000 mcg by mouth daily.   No facility-administered encounter medications on file as of 03/30/2016.   :   Review of Systems:  Out of a complete 14 point review of systems, all are reviewed and negative with the exception of these symptoms as listed below:  Review of Systems  Neurological:       Patient had shingles at his mouth recently. Diagnosed with Bell's Palsy. Taken to ED but states no MRI was done. PCP recommended Neurologist assessment to rule out stroke.   Patient reports that he stumbles.     Objective:  Neurologic Exam  Physical Exam Physical Examination:   Vitals:   03/30/16 1520  BP: (!) 142/62  Pulse: 76  Resp: 20   General Examination: The patient is a very pleasant 82 y.o. male in no acute distress. He appears well-developed and well-nourished and adequately groomed.   HEENT: Normocephalic, atraumatic, pupils are Dilated and poorly reactive, of note, he had a recent dilated eye exam today with his ophthalmologist. Funduscopic exam is unremarkable, he had cataract surgery on the left.he is somewhat photophobic secondary to dilated pupils. Wearing a patch on the left which she briefly was able to take off. He has obvious Bell's phenomenon upon attempted eye closure, is unable to close his eye on the left completely. Extraocular tracking is good without limitation to gaze excursion or nystagmus noted. Normal smooth pursuit is noted. Hearing is Impaired bilaterally. Tympanic membranes are difficult to visualize because of ample cerumen bilaterally. Face is  asymmetriwith obvious left lower motor neuron facial weakness noted, he is not reporting any pain in the left face and there are no blisters in the mouth. He has full dentures. Speech is not dysarthric but sometimes difficult to understand because of droop of his left angle of mouth. There is no lip, neck/head, jaw or voice tremor. Neck is  supple with full range of passive and active  motion. Tongue protrudes centrally and palate elevates symmetrically.  Chest: Clear to auscultation without wheezing, rhonchi or crackles noted.  Heart: S1+S2+0, regular and normal without murmurs, rubs or gallops noted.   Abdomen: Soft, non-tender and non-distended with normal bowel sounds appreciated on auscultation.  Extremities: There is trace pitting edema in the distal lower extremities bilaterally. Pedal pulses are intact.  Skin: Warm and dry with some bruising across his forearms noted.   Musculoskeletal: exam reveals no obvious joint deformities, tenderness or joint swelling or erythema.   Neurologically:  Mental status: The patient is awake, alert and oriented in all 4 spheres. His immediate and remote memory, attention, language skills and fund of knowledge are appropriate. There is no evidence of aphasia, agnosia, apraxia or anomia. Speech is clear with normal prosody and enunciation. Thought process is linear. Mood is normal and affect is normal.  Cranial nerves II - XII are as described above under HEENT exam. In addition: shoulder shrug is normal with equal shoulder height noted. Motor exam: Normal bulk, strength and tone is noted. There is no drift, tremor or rebound. Romberg not testable as he cannot safely stand narrow based. Reflexes are 1+ throughout, with the exception of absent ankle jerks. Fine motor skills are mildly impaired throughout. Sensory exam is decreased to all modalities in the distal lower extremities, intact in the upper extremities. He stands up with difficulty and walks slightly wide-based and insecurely.     Assessment and Plan:  Assessment and Plan:  In summary, Rodney Farley is a very pleasant 82 y.o.-year old male with an underlying complex medical history of hypertension, hyperlipidemia, neuropathy, chronic kidney disease, diabetes, heart disease with status post MI in 1990s, COPD, complete AV block with  status post pacemaker, CKD, history of TIA, who has L Bell's palsy and no significant improvement of his left facial weakness since symptom onset in June 2017. Unfortunately, he has significant eye related problems including a pulsating pain in his left eye, had an eye exam today with dilation and has been wearing an eye patch on the left side and uses an ointment at night for protection. He is in close follow-up with Dr. Katy Fitch for this. He has no other focal neurologic abnormality, classic signs of Bell's palsy on the left, and also an exam in keeping with peripheral neuropathy which in conjunction with loss of binocular vision will pose him at risk for falls. He is advised to use his cane for safety, or hold onto his wife as he has. He has a head CT without contrast pending. I agree with his. He cannot have an MRI and he cannot have contrast because of impaired kidney function at baseline. At this juncture, I suggested as needed follow-up, I will be happy to review his head CT results when available. I answered all their questions today and the patient and his wife were in agreement. Unfortunately, I am not sure how to explain his eye pain. This does not indicate corneal abrasion or problem as his symptoms and description is not in keeping with complications of his cornea.  Thank you very much for allowing me to participate in the care of this nice patient. If I can be of any further assistance to you please do not hesitate to call me at [phone].  Sincerely,   Rodney Age, MD, PhD

## 2016-03-30 NOTE — Patient Instructions (Addendum)
We will await your head CT results.  We will have you follow with your eye doctor.  I will likely see you back as needed.  Use your cane for safety, your neuropathy and having one eye patched puts you at fall risk.

## 2016-03-30 NOTE — Telephone Encounter (Signed)
Patient had Bells [place] - wife says eye doctor wants [place] to give him pain medication b/c he can't close his eye.   Saw Dr. Carlota Raspberry on 14th Sept .   [facility]

## 2016-03-31 MED FILL — CLOPIDOGREL 75 MG TABLET: 75 | 30 days supply | Qty: 30 | Fill #3

## 2016-03-31 MED FILL — PANTOPRAZOLE SOD DR 40 MG T: 40 | 30 days supply | Qty: 30 | Fill #4

## 2016-03-31 NOTE — Telephone Encounter (Signed)
Please check into CT head order. This was placed last week.

## 2016-03-31 NOTE — Telephone Encounter (Signed)
Dr Katy Fitch wants to stitch his eye closed.  Mild pain reliever while his is going through surgery this Saturday.  Dr. Nicki Reaper to do the surgery.

## 2016-04-01 NOTE — Telephone Encounter (Signed)
Informed Dr. Katy Fitch that the order has been placed for CT head

## 2016-04-01 NOTE — Telephone Encounter (Signed)
I thinks that will be fine. Can we check into status of head CT?

## 2016-04-02 ENCOUNTER — Telehealth: Payer: Self-pay

## 2016-04-02 ENCOUNTER — Encounter: Payer: Self-pay | Admitting: Family Medicine

## 2016-04-02 ENCOUNTER — Ambulatory Visit (INDEPENDENT_AMBULATORY_CARE_PROVIDER_SITE_OTHER): Payer: BLUE CROSS/BLUE SHIELD | Admitting: Family Medicine

## 2016-04-02 VITALS — BP 140/82 | HR 92 | Temp 98.0°F | Resp 18 | Ht 67.0 in | Wt 223.0 lb

## 2016-04-02 DIAGNOSIS — R29810 Facial weakness: Secondary | ICD-10-CM | POA: Diagnosis not present

## 2016-04-02 DIAGNOSIS — H5712 Ocular pain, left eye: Secondary | ICD-10-CM | POA: Diagnosis not present

## 2016-04-02 DIAGNOSIS — R2681 Unsteadiness on feet: Secondary | ICD-10-CM | POA: Diagnosis not present

## 2016-04-02 DIAGNOSIS — G510 Bell's palsy: Secondary | ICD-10-CM | POA: Diagnosis not present

## 2016-04-02 MED ORDER — HYDROCODONE-ACETAMINOPHEN 5-325 MG PO TABS: 0.5000 | ORAL_TABLET | Freq: Four times a day (QID) | ORAL | 0 refills | Status: DC | PRN

## 2016-04-02 MED FILL — ISOSORBIDE MN ER 30 MG TAB: 30 | 30 days supply | Qty: 30 | Fill #4

## 2016-04-02 MED FILL — HYDROCODON-APAP 5-325: 5-325 | 3 days supply | Qty: 10 | Fill #0

## 2016-04-02 NOTE — Telephone Encounter (Signed)
Left a VM for patient regarding CT appointment. Also wanted to make sure that the 12:10 appointment time work for him. Appointment is at [facility] on  [address].

## 2016-04-02 NOTE — Progress Notes (Signed)
Subjective:  By signing my name below, I, Rodney Farley, attest that this documentation has been prepared under the direction and in the presence of Rodney Farley. Electronically Signed: Moises Farley, Rodney Farley. 04/02/2016 , 1:48 PM .  Patient was seen in Room 3 .   Patient ID: Rodney Farley, male    DOB: [birth year], 82 y.o.   MRN: [id number] Chief Complaint  Patient presents with   Follow-up    LEFT EYE   HPI Rodney Farley is a 82 y.o. male Here for follow up; see last visit. He had persistent left eye pain since suspected Bell's Palsy in June. He is under the Dr. Katy Farley, who is considering stitching the left eye close for protection. He has history of peripheral neuropathy and multiple other chronic problems including diabetes, chronic kidney disease, AV block with pacemaker and history of TIA. He was evaluated 3 days ago by neurology, discussed using cane for safety to decrease fall risk, and CT head is pending with his history of speech difficulty and recent facial weakness. This, however, was thought due to Bell's Palsy. He was a new patient to me last visit, previously followed by Dr. [name].   He was seen by his neurologist, Dr. Rexene Farley, 3 days ago, who still thinks it's Bell's Palsy. She will also review his head CT when available. He denies pain in his left eye in the last 24 hours, first time since June 30th. He will see Dr. Katy Farley in 2 days to stitch the corner of his left eye. He plans to see Dr. Caryl Farley tomorrow to check on the pacemaker. He still has some trouble with swallowing foods. Dr. Katy Farley informs he is able to take pain medication for the procedure in 2 days. He denies known side effects with pain medication.   Patient Active Problem List   Diagnosis Date Noted   Congestive heart failure, NYHA class III ([place]) 09/23/2015   Gout 06/26/2015   AKI (acute kidney injury) ([place])    Left arm pain 05/08/2015   Left hand pain 81/19/1478   Chronic systolic CHF  (congestive heart failure) ([place]) 04/08/2015   Diverticulitis 04/08/2015   Bilateral edema of lower extremity 03/29/2015   DOE (dyspnea on exertion), due to significant CAD 03/28/2015   CKD (chronic kidney disease) stage 4, GFR 15-29 ml/min (HCC) 03/04/2015   Unstable angina ([place]) 10/16/2014   Acute renal failure superimposed on stage 4 chronic kidney disease ([place]) 10/16/2014   Dyspnea on exertion 09/13/2014   CAD (coronary artery disease), native coronary artery, in all vessels, significant 09/13/2014   GERD (gastroesophageal reflux disease) 08/07/2012   Renal insufficiency 08/07/2012   Depression 08/05/2012   Sleep apnea 08/05/2012   Diabetes type 2, uncontrolled ([place]) 08/05/2012   HTN (hypertension) 08/05/2012   Ischemic cardiomyopathy 11/03/2011   Atrioventricular block, complete ([place])    Obesity    Pacemaker    Past Medical History:  Diagnosis Date   Anginal pain ([place])    Anxiety    Arm pain 05/08/2015   LEFT ARM   Atrioventricular block, complete ([place])    a. 2010 s/p pacemaker.   Chronic systolic CHF (congestive heart failure) ([place])    a. LVEF 35-40% by echo 09/2014.   CKD (chronic kidney disease), stage IV (HCC)    COPD (chronic obstructive pulmonary disease) (HCC)    Severe   Coronary artery disease    a. s/p MI in 1994/1995 while in [place] s/p questionable PCI. 03/2015: progression of disease, for staged  PCI.   Depression    Diabetic peripheral neuropathy (HCC)    DOE (dyspnea on exertion) 03/28/2015   GERD (gastroesophageal reflux disease)    Hypercholesterolemia    Hypertension    MI (myocardial infarction) ([place]) 1994; 1995   Neuropathy ([place])    IN LOWER EXTREMITIES   Obesity    Pacemaker    medtronic>>> MDT ICD 09/23/15   Sleep apnea    "sleeps w/humidifyer when he panics and gets short of breath" (04/08/2015)   TIA (transient ischemic attack) X 3   Type II diabetes mellitus ([place])    Past Surgical History:  Procedure  Laterality Date   CARDIAC CATHETERIZATION N/A 03/29/2015   Procedure: Right/Left Heart Cath and Coronary Angiography;  Surgeon: Goro M [name], Farley;  Location: [facility];  Service: Cardiovascular;  Laterality: N/A;   [place]   "after my MI; put me on heart RX after cath"   CARDIAC CATHETERIZATION N/A 04/09/2015   Procedure: Coronary Stent Intervention;  Surgeon: Alekai M [name], Farley;  Location: [facility];  Service: Cardiovascular;  Laterality: N/A;   CHOLECYSTECTOMY OPEN  ~ [place] [facility] N/A 09/23/2015   MDT CRT-D, Dr. Caryl Farley   HIATAL HERNIA REPAIR  1977   INSERT / REPLACE / REMOVE PACEMAKER  07/2008   Complete heart block status post DDD with good function   TONSILLECTOMY     Allergies  Allergen Reactions   Bee Venom Anaphylaxis   Lyrica [Pregabalin] Other (See Comments)    hallucinations   Prednisone Other (See Comments)    hallucinations   Zocor [Simvastatin] Nausea Only and Other (See Comments)    Headache with brand name only.  Can take the generic.   Prior to Admission medications   Medication Sig Start Date End Date Taking? Authorizing Provider  aspirin EC 81 MG tablet Take 81 mg by mouth daily.   Yes Historical Provider, Farley  atorvastatin (LIPITOR) 40 MG tablet Take 1 tablet (40 mg total) by mouth daily. 04/19/15  Yes [name], Farley  bacitracin ophthalmic ointment Place 1 application into both eyes at bedtime. Apply to eyelids 08/23/15  Yes Historical Provider, Farley  carvedilol (COREG) 12.5 MG tablet Take 1 tablet (12.5 mg total) by mouth 2 (two) times daily with a meal. 07/09/15  Yes Joseangel M [name], Farley  clopidogrel (PLAVIX) 75 MG tablet TAKE 1 TABLET BY MOUTH DAILY WITH BREAKFAST 12/26/15  Yes Kobi M [name], Farley  colchicine 0.6 MG tablet Take 0.5 tablets (0.3 mg total) by mouth 3 (three) times daily. 06/18/15  Yes [name], Farley  furosemide (LASIX) 20 MG tablet Take 1.5 tablets (30 mg total) by mouth daily.  Take 1 and 1/2 tablets 30mg  daily 09/24/15  Yes Renee Dyane Dustman, PA-C  hydrALAZINE (APRESOLINE) 25 MG tablet Take 1 tablet (25 mg total) by mouth 2 (two) times daily. 10/03/15  Yes Shirley M [name], Farley  hydroxypropyl methylcellulose / hypromellose (ISOPTO TEARS / GONIOVISC) 2.5 % ophthalmic solution Place 1 drop into the left eye 4 (four) times daily as needed for dry eyes. 01/10/16  Yes Varney Biles, Farley  isosorbide mononitrate (IMDUR) 30 MG 24 hr tablet Take 1 tablet (30 mg total) by mouth daily. 11/28/15  Yes Inioluwa M [name], Farley  LANTUS SOLOSTAR 100 UNIT/ML Solostar Pen INJECT 60 UNITS UNDER THE SKIN ONCE DAILY 11/09/15  Yes [name], Farley  NITROSTAT 0.4 MG SL tablet PLACE 1 TABLET (0.4 MG TOTAL) UNDER  THE TONGUE EVERY 5 (FIVE) MINUTES X 3 DOSES AS NEEDED FOR CHEST PAIN. 11/05/15  Yes Deboraha Sprang, Farley  OVER THE COUNTER MEDICATION Place 1 drop into both eyes 3 (three) times daily. OTC Bausch-Lomb eye bath   Yes Historical Provider, Farley  pantoprazole (PROTONIX) 40 MG tablet Take 1 tablet (40 mg total) by mouth daily. 11/28/15  Yes Ravi M [name], Farley  sertraline (ZOLOFT) 100 MG tablet Take 1.5 tablets (150 mg total) by mouth daily. 04/11/15  Yes [name], Farley  tamsulosin (FLOMAX) 0.4 MG CAPS capsule Take 1 capsule (0.4 mg total) by mouth daily after supper. 03/09/16  Yes Jaynee Eagles, PA-C  UNIFINE PENTIPS 31G X 5 MM MISC USE AS DIRECTED 09/28/15  Yes [name], Farley  vitamin B-12 (CYANOCOBALAMIN) 1000 MCG tablet Take 1,000 mcg by mouth daily.   Yes Historical Provider, Farley  Eyelid Cleansers (AVENOVA) 0.01 % SOLN Apply 1 application topically 2 (two) times daily as needed.    Historical Provider, Farley   Social History   Social History   Marital status: Married    Spouse name: N/A   Number of children: 1   Years of education: college   Occupational History   Retired    Social History Main Topics   Smoking status: Former Smoker    Packs/day: 1.50    Years: 54.00    Types:  Cigarettes    Quit date: 07/18/2007   Smokeless tobacco: Never Used   Alcohol use No     Comment: 04/08/2015 "probably 3 drinks/month"   Drug use: No   Sexual activity: No   Other Topics Concern   Not on file   Social History Narrative   Patient drinks 1 pot of coffee a day    Review of Systems  Constitutional: Negative for chills, fatigue and fever.  HENT: Positive for trouble swallowing.   Eyes: Negative for pain.  Neurological: Positive for tremors, facial asymmetry and speech difficulty. Negative for seizures, syncope, weakness, numbness and headaches.       Objective:   Physical Exam  Constitutional: He is oriented to person, place, and time. He appears well-developed and well-nourished. No distress.  HENT:  Head: Normocephalic and atraumatic.  Eyes: EOM are normal. Pupils are equal, round, and reactive to light.  Neck: Neck supple.  Cardiovascular: Normal rate.   Pulmonary/Chest: Effort normal. No respiratory distress.  Musculoskeletal: Normal range of motion.  Neurological: He is alert and oriented to person, place, and time.  Decreased blink in left as well as decreased smile in left, equal palate elevation and equal movement of tongue, equal upper and lower extremities strength  Skin: Skin is warm and dry.  Psychiatric: He has a normal mood and affect. His behavior is normal.  Nursing note and vitals reviewed.   Vitals:   04/02/16 1258  BP: 140/82  Pulse: 92  Resp: 18  Temp: 98 F (36.7 C)  TempSrc: Oral  SpO2: 95%  Weight: 223 lb (101.2 kg)  Height: 5\' 7"  (1.702 m)      Assessment & Plan:   Rodney Farley is a 82 y.o. male Left eye pain  Bell's palsy  Facial weakness  Unsteadiness  Bell's palsy versus shingles/Ramsay Hunt, with suspected Bell's diagnosis in June. Recent neurology evaluation also suspicious of Bell's palsy.  - CT head pending - will have this done tomorrow. Based on CT head results, will discuss with neurology next step,  as may still need speech therapy/swallowing evaluation.   -  Fall precautions were discussed including using cane or other assistive device as recommended by neurology due to his peripheral neuropathy and now monocular vision.  -Planning on corner stitch for eye to assist with closing with his ophthalmologist this Saturday. Denies any eye pain in the past 24 hours. Did agree to write short-term hydrocodone if needed for surgery on Saturday, but side effects and additional fall precautions were discussed.  Meds ordered this encounter  Medications   HYDROcodone-acetaminophen (NORCO/VICODIN) 5-325 MG tablet    Sig: Take 0.5-1 tablets by mouth every 6 (six) hours as needed for moderate pain.    Dispense:  10 tablet    Refill:  0   Patient Instructions    I will schedule your head CT tomorrow afternoon. Continue follow-up as planned with Dr. Katy Farley on Saturday. I did write for a few hydrocodone if needed for pain. Start with one half pill at a time up to 1 pill every 6 hours as needed. Be very careful with this medicine as it can make you sedated and increase your risk of falling. Depending on your CT head results, I will discuss further plans with neurology including possible speech therapy evaluation for a swallowing study.  Return to the clinic or go to the nearest emergency room if any of your symptoms worsen or new symptoms occur.   IF you received an x-Farley today, you will receive an invoice from [facility]. Please contact [facility] at [phone] with questions or concerns regarding your invoice.   IF you received labwork today, you will receive an invoice from [employer]. Please contact [facility] at [phone] with questions or concerns regarding your invoice.   Our billing staff will not be able to assist you with questions regarding bills from these companies.  You will be contacted with the lab results as soon as they are available. The  fastest way to get your results is to activate your My Chart account. Instructions are located on the last page of this paperwork. If you have not heard from [place] regarding the results in 2 weeks, please contact this office.       I personally performed the services described in this documentation, which was scribed in my presence. The recorded information has been reviewed and considered, and addended by me as needed.   Signed,   Rodney Farley [facility].  04/02/16 7:49 PM

## 2016-04-02 NOTE — Patient Instructions (Addendum)
  I will schedule your head CT tomorrow afternoon. Continue follow-up as planned with Dr. Katy Fitch on Saturday. I did write for a few hydrocodone if needed for pain. Start with one half pill at a time up to 1 pill every 6 hours as needed. Be very careful with this medicine as it can make you sedated and increase your risk of falling. Depending on your CT head results, I will discuss further plans with neurology including possible speech therapy evaluation for a swallowing study.  Return to the clinic or go to the nearest emergency room if any of your symptoms worsen or new symptoms occur.   IF you received an x-ray today, you will receive an invoice from [facility]. Please contact [facility] at [phone] with questions or concerns regarding your invoice.   IF you received labwork today, you will receive an invoice from [employer]. Please contact [facility] at [phone] with questions or concerns regarding your invoice.   Our billing staff will not be able to assist you with questions regarding bills from these companies.  You will be contacted with the lab results as soon as they are available. The fastest way to get your results is to activate your My Chart account. Instructions are located on the last page of this paperwork. If you have not heard from [place] regarding the results in 2 weeks, please contact this office.

## 2016-04-03 ENCOUNTER — Encounter: Payer: Self-pay | Admitting: Internal Medicine

## 2016-04-03 ENCOUNTER — Ambulatory Visit (INDEPENDENT_AMBULATORY_CARE_PROVIDER_SITE_OTHER): Payer: BLUE CROSS/BLUE SHIELD | Admitting: Internal Medicine

## 2016-04-03 ENCOUNTER — Ambulatory Visit
Admission: RE | Admit: 2016-04-03 | Discharge: 2016-04-03 | Disposition: A | Discharge status: Home / Self Care | Payer: BLUE CROSS/BLUE SHIELD | Source: Ambulatory Visit | Attending: Family Medicine | Admitting: Family Medicine

## 2016-04-03 VITALS — BP 118/60 | HR 79 | Ht 67.0 in | Wt 223.8 lb

## 2016-04-03 DIAGNOSIS — I442 Atrioventricular block, complete: Secondary | ICD-10-CM | POA: Diagnosis not present

## 2016-04-03 DIAGNOSIS — R2681 Unsteadiness on feet: Secondary | ICD-10-CM

## 2016-04-03 DIAGNOSIS — I5022 Chronic systolic (congestive) heart failure: Secondary | ICD-10-CM

## 2016-04-03 DIAGNOSIS — Z9181 History of falling: Secondary | ICD-10-CM

## 2016-04-03 DIAGNOSIS — Z95810 Presence of automatic (implantable) cardiac defibrillator: Secondary | ICD-10-CM

## 2016-04-03 DIAGNOSIS — R29810 Facial weakness: Secondary | ICD-10-CM

## 2016-04-03 NOTE — Progress Notes (Signed)
detected Rodney Farley     Patient Care Team: Wendie Agreste, MD as PCP - General (Family Medicine)   HPI  Rodney Farley is a 82 y.o. male Seen in follow-up for a pacemaker implanted in 2010 elsewhere. We saw him to establish in 2013. He has a history of complete heart block. He underwent CRT-D upgrade 3/17.  He is much improved from her breathing point review. Unfortunately he met Rodney Farley gave him a palsy in June.    He has history of ischemic heart disease with prior MI.   Myoview 2002 demonstrated ejection fraction of 49% with prior MI but no ischemia. Echocardiogram 3/16 ejection fraction was 35-40% LHC  9/16 and multivessel PCI.  Echocardiogram 12/16 demonstrated persistent LV dysfunction with an EF of 25-30%.  Echo 7/17  normalization of LV function with CRT       Past Medical History:  Diagnosis Date  . Anginal pain ([place])   . Anxiety   . Arm pain 05/08/2015   LEFT ARM  . Atrioventricular block, complete (Rodney Farley)    a. 2010 s/p pacemaker.  . Chronic systolic CHF (congestive heart failure) (Rodney Farley)    a. LVEF 35-40% by echo 09/2014.  [name] CKD (chronic kidney disease), stage IV (Rodney Farley)   . COPD (chronic obstructive pulmonary disease) (HCC)    Severe  . Coronary artery disease    a. s/p MI in 1994/1995 while in [place] s/p questionable PCI. 03/2015: progression of disease, for staged PCI.  [name] Depression   . Diabetic peripheral neuropathy (Rodney Farley)   . DOE (dyspnea on exertion) 03/28/2015  . GERD (gastroesophageal reflux disease)   . Hypercholesterolemia   . Hypertension   . MI (myocardial infarction) (Rodney Farley) 1994; 1995  . Neuropathy (HCC)    IN LOWER EXTREMITIES  . Obesity   . Pacemaker    medtronic>>> MDT ICD 09/23/15  . Sleep apnea    "sleeps w/humidifyer when he panics and gets short of breath" (04/08/2015)  . TIA (transient ischemic attack) X 3  . Type II diabetes mellitus (Rodney Farley)     Past Surgical History:  Procedure Laterality Date  . CARDIAC CATHETERIZATION  N/A 03/29/2015   Procedure: Right/Left Heart Cath and Coronary Angiography;  Surgeon: Kaedan M [name], MD;  Location: [facility];  Service: Cardiovascular;  Laterality: N/A;  . [place]   "after my MI; put me on heart RX after cath"  . CARDIAC CATHETERIZATION N/A 04/09/2015   Procedure: Coronary Stent Intervention;  Surgeon: Kiowa M [name], MD;  Location: [facility];  Service: Cardiovascular;  Laterality: N/A;  . CHOLECYSTECTOMY OPEN  ~ 1977  . EP IMPLANTABLE DEVICE N/A 09/23/2015   MDT CRT-D, Dr. Caryl Comes  . HIATAL HERNIA REPAIR  1977  . INSERT / REPLACE / REMOVE PACEMAKER  07/2008   Complete heart block status post DDD with good function  . TONSILLECTOMY      Current Outpatient Prescriptions  Medication Sig Dispense Refill  . aspirin EC 81 MG tablet Take 81 mg by mouth daily.    [name] atorvastatin (LIPITOR) 40 MG tablet Take 1 tablet (40 mg total) by mouth daily. 90 tablet 3  . bacitracin ophthalmic ointment Place 1 application into both eyes at bedtime. Apply to eyelids  3  . carvedilol (COREG) 12.5 MG tablet Take 1 tablet (12.5 mg total) by mouth 2 (two) times daily with a meal. 60 tablet 11  . clopidogrel (PLAVIX) 75 MG tablet TAKE 1 TABLET BY MOUTH  DAILY WITH BREAKFAST 30 tablet 3  . colchicine 0.6 MG tablet Take 0.5 tablets (0.3 mg total) by mouth 3 (three) times daily. 90 tablet 3  . Eyelid Cleansers (AVENOVA) 0.01 % SOLN Apply 1 application topically 2 (two) times daily as needed.    . furosemide (LASIX) 20 MG tablet Take 1.5 tablets (30 mg total) by mouth daily. Take 1 and 1/2 tablets 63m daily 45 tablet 1  . hydrALAZINE (APRESOLINE) 25 MG tablet Take 1 tablet (25 mg total) by mouth 2 (two) times daily. 90 tablet 11  . HYDROcodone-acetaminophen (NORCO/VICODIN) 5-325 MG tablet Take 0.5-1 tablets by mouth every 6 (six) hours as needed for moderate pain. 10 tablet 0  . hydroxypropyl methylcellulose / hypromellose (ISOPTO TEARS / GONIOVISC) 2.5 % ophthalmic  solution Place 1 drop into the left eye 4 (four) times daily as needed for dry eyes. 15 mL 12  . isosorbide mononitrate (IMDUR) 30 MG 24 hr tablet Take 1 tablet (30 mg total) by mouth daily. 30 tablet 6  . LANTUS SOLOSTAR 100 UNIT/ML Solostar Pen INJECT 60 UNITS UNDER THE SKIN ONCE DAILY 45 mL 2  . NITROSTAT 0.4 MG SL tablet PLACE 1 TABLET (0.4 MG TOTAL) UNDER THE TONGUE EVERY 5 (FIVE) MINUTES X 3 DOSES AS NEEDED FOR CHEST PAIN. 25 tablet 3  . OVER THE COUNTER MEDICATION Place 1 drop into both eyes 3 (three) times daily. OTC Bausch-Lomb eye bath    . pantoprazole (PROTONIX) 40 MG tablet Take 1 tablet (40 mg total) by mouth daily. 30 tablet 6  . sertraline (ZOLOFT) 100 MG tablet Take 1.5 tablets (150 mg total) by mouth daily. 135 tablet 1  . tamsulosin (FLOMAX) 0.4 MG CAPS capsule Take 1 capsule (0.4 mg total) by mouth daily after supper. 30 capsule 0  . UNIFINE PENTIPS 31G X 5 MM MISC USE AS DIRECTED 100 each 0  . vitamin B-12 (CYANOCOBALAMIN) 1000 MCG tablet Take 1,000 mcg by mouth daily.     No current facility-administered medications for this visit.     Allergies  Allergen Reactions  . Bee Venom Anaphylaxis  . Lyrica [Pregabalin] Other (See Comments)    hallucinations  . Prednisone Other (See Comments)    hallucinations  . Zocor [Simvastatin] Nausea Only and Other (See Comments)    Headache with brand name only.  Can take the generic.    Review of Systems negative except from HPI and PMH  Physical Exam BP 118/60   Pulse 79   Ht 5' 7" (1.702 m)   Wt 223 lb 12.8 oz (101.5 kg)   SpO2 95%   BMI 35.05 kg/m  Well developed and well nourished in no acute distress HENT normal E scleral and icterus clear Neck Supple JVP flat; carotids brisk and full Clear to ausculation  Regular rate and rhythm, no murmurs gallops or rub Soft with active bowel sounds No clubbing cyanosis  Edema Alert and oriented, grossly normal motor and sensory function Skin Warm and Dry  ECG  AV pacing   QRS upright V1/neg 1   Assessment and  Plan Complete heart block  Ischemic heart disease with prior MI  ICD-CRT  Medtronic [name] device was interrogated.  The information was reviewed. No changes were made in the programming.     CHF chronic diastolic  Euvolemic continue current meds  Without symptoms of ischemia  Much improved post CRT

## 2016-04-03 NOTE — Patient Instructions (Signed)
Medication Instructions: - Your physician recommends that you continue on your current medications as directed. Please refer to the Current Medication list given to you today.  Labwork: - none ordered  Procedures/Testing: - none ordered  Follow-Up: - Remote monitoring is used to monitor your Pacemaker of ICD from home. This monitoring reduces the number of office visits required to check your device to one time per year. It allows [place] to keep an eye on the functioning of your device to ensure it is working properly. You are scheduled for a device check from home on 07/07/16. You may send your transmission at any time that day. If you have a wireless device, the transmission will be sent automatically. After your physician reviews your transmission, you will receive a postcard with your next transmission date.  - Your physician wants you to follow-up in: March 2018 with Chanetta Marshall, NP for Dr. Caryl Comes. You will receive a reminder letter in the mail two months in advance. If you don't receive a letter, please call our office to schedule the follow-up appointment.  Any Additional Special Instructions Will Be Listed Below (If Applicable).     If you need a refill on your cardiac medications before your next appointment, please call your pharmacy.

## 2016-04-06 ENCOUNTER — Other Ambulatory Visit: Payer: Self-pay | Admitting: Urgent Care

## 2016-04-07 ENCOUNTER — Telehealth: Payer: Self-pay | Admitting: Emergency Medicine

## 2016-04-07 NOTE — Telephone Encounter (Signed)
-----   Message from Wendie Agreste, MD sent at 04/06/2016 11:05 AM EDT ----- Call patient. CT scan of his head did not indicate any acute stroke or other concerning findings. Chronic small vessel disease, but no significant changes since 2016. We'll also forward a report to his neurologist to decide if further workup needed or next step in reported difficulty in swallowing. Let me know if there are any questions.

## 2016-04-09 ENCOUNTER — Other Ambulatory Visit: Payer: Self-pay

## 2016-04-09 ENCOUNTER — Other Ambulatory Visit: Payer: Self-pay | Admitting: Neurology

## 2016-04-09 DIAGNOSIS — G510 Bell's palsy: Secondary | ICD-10-CM

## 2016-04-09 DIAGNOSIS — R1310 Dysphagia, unspecified: Secondary | ICD-10-CM

## 2016-04-09 LAB — CUP PACEART INCLINIC DEVICE CHECK
Date Time Interrogation Session: 20170928113819
Implantable Lead Implant Date: 20100112
Implantable Lead Implant Date: 20170313
Implantable Lead Location: 753858
Implantable Lead Location: 753859
Implantable Lead Model: 4598
Lead Channel Pacing Threshold Pulse Width: 0.4 ms
Lead Channel Sensing Intrinsic Amplitude: 1.9 mV
MDC IDC LEAD IMPLANT DT: 20170313
MDC IDC LEAD LOCATION: 753860
MDC IDC MSMT LEADCHNL RA PACING THRESHOLD AMPLITUDE: 1.25 V
MDC IDC MSMT LEADCHNL RA PACING THRESHOLD PULSEWIDTH: 0.4 ms
MDC IDC MSMT LEADCHNL RV PACING THRESHOLD AMPLITUDE: 0.75 V

## 2016-04-09 MED FILL — TAMSULOSIN HCL 0.4 MG CAP: 0.4 | 30 days supply | Qty: 30 | Fill #0 | Status: TO

## 2016-04-09 NOTE — Progress Notes (Signed)
Will order ST eval and treatment.

## 2016-04-09 NOTE — Telephone Encounter (Signed)
Electronic request for refill on Flomax. Discussed with Dr. Zannie Cove.  OK to refill

## 2016-04-13 MED FILL — hydrALAZINE HCL 25 MG TABS: 25 | 30 days supply | Qty: 90 | Fill #8 | Status: TO

## 2016-04-13 MED FILL — FUROSEMIDE 40 MG TABLET: 40 | 30 days supply | Qty: 60 | Fill #4 | Status: TO

## 2016-04-16 ENCOUNTER — Telehealth: Payer: Self-pay

## 2016-04-16 DIAGNOSIS — F32A Depression, unspecified: Secondary | ICD-10-CM

## 2016-04-16 DIAGNOSIS — F329 Major depressive disorder, single episode, unspecified: Secondary | ICD-10-CM

## 2016-04-16 MED FILL — CARVEDILOL 12.5 MG TABLET: 12.5 | 30 days supply | Qty: 60 | Fill #4

## 2016-04-16 NOTE — Telephone Encounter (Signed)
PATIENT'S WIFE (RAYLENE) WOULD LIKE DR. GREENE TO KNOW THAT THEIR BCBS HAS BEEN CANCELLED. HE NEEDS REFILLS ON HIS MEDICINE. THE COLCHICINE 0.6 MG IS $680.00 WHICH THEY CAN NOT AFFORD. SHE KNOWS THIS IS A ANTI-DEPRESSANT AND HE CAN NOT JUST STOP TAKING IT. THEY ARE NOT SURE WHAT TO DO. HE ALSO NEEDS REFILLS ON CARVEDILOL 12.5 MG AND SERTRALINE 100 MG. THESE ARE NOT TOO EXPENSIVE. BEST PHONE [phone] (WIFE'S NAME IS Cristal Generous) PHARMACY CHOICE IS [facility]. [place]

## 2016-04-17 ENCOUNTER — Telehealth: Payer: Self-pay

## 2016-04-17 NOTE — Telephone Encounter (Signed)
Dr Carlota Raspberry  Patient no longer has insurance coverage  Wants to change medicine to Allopurinol   [facility]  [phone]

## 2016-04-19 MED ORDER — SERTRALINE HCL 100 MG PO TABS: 150.0000 mg | ORAL_TABLET | Freq: Every day | ORAL | 1 refills | Status: DC

## 2016-04-19 MED ORDER — CARVEDILOL 12.5 MG PO TABS: 12.5000 mg | ORAL_TABLET | Freq: Two times a day (BID) | ORAL | 11 refills | Status: DC

## 2016-04-19 NOTE — Telephone Encounter (Signed)
The colchicine is not an antidepressant, but is instead use for gout. He may only need to take that with acute gout flares. Another option would be starting allopurinol once per day that would help lessen chance of gout attack. When is the last time he had a gout attack?  I will send in refills of his carvedilol and sertraline.

## 2016-04-19 NOTE — Progress Notes (Signed)
CARDIOLOGY OFFICE NOTE  Date:  04/20/2016    Rodney Farley Date of Birth: 1931-10-06 Medical Record #[id number]  PCP:  Wendie Agreste, MD  Cardiologist:  Thoren [name]  MD  Chief Complaint  Patient presents with  . Congestive Heart Failure  . Coronary Artery Disease    History of Present Illness: Rodney Farley is a 82 y.o. male who is seen for follow up CAD and CHF.   He has a history of CAD (s/p MI in 1994/1995 while in [place] s/p questionable PCI, , CHB 2010 s/p pacemaker, CKD stage IV, DM, HTN, HLD, remote tobacco abuse, COPD, moderate sleep apnea, and chronic systolic CHF.    Last year he had progression of DOE, chest discomfort and chest pounding with severe limitation on exertion. 2D Echo 09/2014 showed EF 35-40% with +WMA, grade 1 DD, mild MR. PFTs looked remarkably good. He underwent cardiac cath on 03/28/15 and Cr was 2.14 . LE duplex negative for DVT. Cath showed severe 3V CAD - the mid LAD, second diagonal, and third OM branches appeared suitable for PCI +/- distal LCx. The other option would be CABG. After discussion concerning risk and benefits hewas treated with PCI.   Patient underwent Multivessel PCI including stenting of the mid LAD, second diagonal, and third OM with DES on 04/09/15.  Distal 70% circumflex lesion  treated medically.He was started on aspirin and Plavix. Hydralazine was added for afterload reduction and better blood pressure control.   After his procedure he  had a marked improvement in his symptoms of dyspnea.  No chest pain. Breathing was much better. He had repeat Echo that still showed depressed EF 25-30% despite revascularization. He underwent upgrade of PPM to a BiV ICD device by Dr. Caryl Comes on 09/23/15. Follow up Echo in July 2017 showed normalization of EF.   ICD check on 04/09/16 showed no arrythmia and normal heart failure diagnostics. BiV paced.  On follow up today he continues to do very well. Weight is stable. No DOE or chest pain.  No edema or orthopnea. No palpitations. He does have some depression. He had a shingles outbreak involving his left eye and had recent surgery on he eyelids. Overall he is feeling well.  Past Medical History:  Diagnosis Date  . Anginal pain ([place])   . Anxiety   . Arm pain 05/08/2015   LEFT ARM  . Atrioventricular block, complete ([place])    a. 2010 s/p pacemaker.  . Chronic systolic CHF (congestive heart failure) ([place])    a. LVEF 35-40% by echo 09/2014.  [name] CKD (chronic kidney disease), stage IV ([place])   . COPD (chronic obstructive pulmonary disease) ([facility])    Severe  . Coronary artery disease    a. s/p MI in 1994/1995 while in [place] s/p questionable PCI. 03/2015: progression of disease, for staged PCI.  [name] Depression   . Diabetic peripheral neuropathy ([place])   . DOE (dyspnea on exertion) 03/28/2015  . GERD (gastroesophageal reflux disease)   . Hypercholesterolemia   . Hypertension   . MI (myocardial infarction) 1994; 1995  . Neuropathy ([facility])    IN LOWER EXTREMITIES  . Obesity   . Pacemaker    medtronic>>> MDT ICD 09/23/15  . Sleep apnea    "sleeps w/humidifyer when he panics and gets short of breath" (04/08/2015)  . TIA (transient ischemic attack) X 3  . Type II diabetes mellitus ([place])     Past Surgical History:  Procedure Laterality Date  . CARDIAC CATHETERIZATION  N/A 03/29/2015   Procedure: Right/Left Heart Cath and Coronary Angiography;  Surgeon: Takahiro M [name], MD;  Location: [facility];  Service: Cardiovascular;  Laterality: N/A;  . [place]   "after my MI; put me on heart RX after cath"  . CARDIAC CATHETERIZATION N/A 04/09/2015   Procedure: Coronary Stent Intervention;  Surgeon: Nassim M [name], MD;  Location: [facility];  Service: Cardiovascular;  Laterality: N/A;  . CHOLECYSTECTOMY OPEN  ~ 1977  . EP IMPLANTABLE DEVICE N/A 09/23/2015   MDT CRT-D, Dr. Caryl Comes  . HIATAL HERNIA REPAIR  1977  . INSERT / REPLACE / REMOVE PACEMAKER  07/2008    Complete heart block status post DDD with good function  . TONSILLECTOMY       Medications: Current Outpatient Prescriptions  Medication Sig Dispense Refill  . aspirin EC 81 MG tablet Take 81 mg by mouth daily.    [name] atorvastatin (LIPITOR) 40 MG tablet Take 1 tablet (40 mg total) by mouth daily. 90 tablet 3  . bacitracin ophthalmic ointment Place 1 application into both eyes at bedtime. Apply to eyelids  3  . carvedilol (COREG) 12.5 MG tablet Take 1 tablet (12.5 mg total) by mouth 2 (two) times daily with a meal. 60 tablet 11  . clopidogrel (PLAVIX) 75 MG tablet TAKE 1 TABLET BY MOUTH DAILY WITH BREAKFAST 30 tablet 3  . colchicine 0.6 MG tablet Take 0.5 tablets (0.3 mg total) by mouth 3 (three) times daily. 90 tablet 3  . Eyelid Cleansers (AVENOVA) 0.01 % SOLN Apply 1 application topically 2 (two) times daily as needed.    . furosemide (LASIX) 20 MG tablet Take 1.5 tablets (30 mg total) by mouth daily. Take 1 and 1/2 tablets 30mg  daily 45 tablet 1  . hydrALAZINE (APRESOLINE) 25 MG tablet Take 1 tablet (25 mg total) by mouth 2 (two) times daily. 90 tablet 11  . HYDROcodone-acetaminophen (NORCO/VICODIN) 5-325 MG tablet Take 0.5-1 tablets by mouth every 6 (six) hours as needed for moderate pain. 10 tablet 0  . hydroxypropyl methylcellulose / hypromellose (ISOPTO TEARS / GONIOVISC) 2.5 % ophthalmic solution Place 1 drop into the left eye 4 (four) times daily as needed for dry eyes. 15 mL 12  . isosorbide mononitrate (IMDUR) 30 MG 24 hr tablet Take 1 tablet (30 mg total) by mouth daily. 30 tablet 6  . LANTUS SOLOSTAR 100 UNIT/ML Solostar Pen INJECT 60 UNITS UNDER THE SKIN ONCE DAILY 45 mL 2  . NITROSTAT 0.4 MG SL tablet PLACE 1 TABLET (0.4 MG TOTAL) UNDER THE TONGUE EVERY 5 (FIVE) MINUTES X 3 DOSES AS NEEDED FOR CHEST PAIN. 25 tablet 3  . OVER THE COUNTER MEDICATION Place 1 drop into both eyes 3 (three) times daily. OTC [place] eye bath    . pantoprazole (PROTONIX) 40 MG tablet Take 1 tablet (40  mg total) by mouth daily. 30 tablet 6  . sertraline (ZOLOFT) 100 MG tablet Take 1.5 tablets (150 mg total) by mouth daily. 135 tablet 1  . tamsulosin (FLOMAX) 0.4 MG CAPS capsule TAKE 1 CAPSULE BY MOUTH DAILY AFTER SUPPER 30 capsule 11  . UNIFINE PENTIPS 31G X 5 MM MISC USE AS DIRECTED 100 each 0  . vitamin B-12 (CYANOCOBALAMIN) 1000 MCG tablet Take 1,000 mcg by mouth daily.     No current facility-administered medications for this visit.     Allergies: Allergies  Allergen Reactions  . Bee Venom Anaphylaxis  . Lyrica [Pregabalin] Other (See Comments)  hallucinations  . Prednisone Other (See Comments)    hallucinations  . Zocor [Simvastatin] Nausea Only and Other (See Comments)    Headache with brand name only.  Can take the generic.    Social History: The patient  reports that he quit smoking about 8 years ago. His smoking use included Cigarettes. He has a 81.00 pack-year smoking history. He has never used smokeless tobacco. He reports that he does not drink alcohol or use drugs.   Family History: [name] family history includes Heart attack in his brother; Leukemia in his father; Stroke in his mother and sister.   Review of Systems: Please see the history of present illness.   Otherwise, the review of systems is positive for none.   All other systems are reviewed and negative.   Physical Exam: VS:  BP 129/61   Pulse 94   Ht 5\' 7"  (1.702 m)   Wt 223 lb 6.4 oz (101.3 kg)   SpO2 94%   BMI 34.99 kg/m  .  BMI Body mass index is 34.99 kg/m.  Wt Readings from Last 3 Encounters:  04/20/16 223 lb 6.4 oz (101.3 kg)  04/03/16 223 lb 12.8 oz (101.5 kg)  04/02/16 223 lb (101.2 kg)    General: Pleasant. Well developed, well nourished and in no acute distress.   HEENT: Normal.  Neck: Supple, no JVD, carotid bruits, or masses noted.  Cardiac: Regular rate and rhythm. No murmurs, rubs, or gallops. No edema.  Respiratory:  Lungs are clear to auscultation bilaterally with  normal work of breathing.  GI: Soft and nontender.  MS: No deformity or atrophy. Gait and ROM intact.  Skin: Warm and dry. Color is normal.  Neuro:  Strength and sensation are intact and no gross focal deficits noted.  Psych: Alert, appropriate and with normal affect.   LABORATORY DATA:  EKG:  EKG is not ordered today.  Lab Results  Component Value Date   WBC 9.3 01/10/2016   HGB 12.7 (L) 01/10/2016   HCT 37.6 (L) 01/10/2016   PLT 205 01/10/2016   GLUCOSE 94 01/10/2016   CHOL 171 06/26/2015   TRIG 317 (H) 06/26/2015   HDL 43 06/26/2015   LDLCALC 65 06/26/2015   ALT 18 01/10/2016   AST 17 01/10/2016   NA 138 01/10/2016   K 4.1 01/10/2016   CL 106 01/10/2016   CREATININE 2.74 (H) 01/10/2016   BUN 46 (H) 01/10/2016   CO2 22 01/10/2016   TSH 1.606 03/28/2015   PSA 0.55 09/19/2014   INR 1.05 01/10/2016   HGBA1C 6.8 03/26/2016   MICROALBUR 68.3 (H) 09/19/2014    BNP (last 3 results)  Recent Labs  07/08/15 1536  BNP 143.5*    ProBNP (last 3 results) No results for input(s): PROBNP in the last 8760 hours.   Other Studies Reviewed Today:  Echo Study Conclusions from 01/23/16:  Study Conclusions  - Left ventricle: The cavity size was normal. Wall thickness was   increased in a pattern of mild LVH. Systolic function was normal.   The estimated ejection fraction was in the range of 60% to 65%.   Doppler parameters are consistent with abnormal left ventricular   relaxation (grade 1 diastolic dysfunction).  Assessment/Plan: 1. CAD - s/p  left heart catheterization revealing Mid LAD lesion, 90% stenosed, 2nd Diag lesion, 90% stenosed, 3rd Mrg lesion, 90% stenosed. He received drug-eluting stents to each lesion in September 2016. Distal 70% circumflex lesion will be treated medically. Significant symptomatic improvement. He  is on DAPT with Plavix and aspirin - will plan for  long term. He is to continue lifestyle modification.  2. ICM EF 25-30%. S/p BiV ICD. Echo  in July showed normalization of EF with excellent response.   3. Chronic systolic HF -  He is on optimal therapy with Coreg, diuretics, hydralazine, and nitrates. Not a candidate for ACEi/ARB or Entresto due to CKD.   4. HTN - controlled  5. HLD - on statin therapy with good LDL control.   Current medicines are reviewed with the patient today.  The patient does not have concerns regarding medicines other than what has been noted above.  The following changes have been made:  See above. Recommend he take colchicine on a prn basis instead of daily.   Labs/ tests ordered today include:    No orders of the defined types were placed in this encounter.    Disposition:   FU with Dr. [name] 6 months.   Patient is agreeable to this plan and will call if any problems develop in the interim.   Signed: Alvey [name] MD, [facility]    04/20/2016 8:43 PM

## 2016-04-20 ENCOUNTER — Encounter: Payer: Self-pay | Admitting: Cardiology

## 2016-04-20 ENCOUNTER — Ambulatory Visit (INDEPENDENT_AMBULATORY_CARE_PROVIDER_SITE_OTHER): Payer: Medicare Other | Admitting: Cardiology

## 2016-04-20 VITALS — BP 129/61 | HR 94 | Ht 67.0 in | Wt 223.4 lb

## 2016-04-20 DIAGNOSIS — I255 Ischemic cardiomyopathy: Secondary | ICD-10-CM

## 2016-04-20 DIAGNOSIS — I442 Atrioventricular block, complete: Secondary | ICD-10-CM

## 2016-04-20 DIAGNOSIS — I5022 Chronic systolic (congestive) heart failure: Secondary | ICD-10-CM | POA: Diagnosis not present

## 2016-04-20 DIAGNOSIS — I2510 Atherosclerotic heart disease of native coronary artery without angina pectoris: Secondary | ICD-10-CM

## 2016-04-20 NOTE — Patient Instructions (Signed)
Continue your current therapy   Just take colchicine as needed for gout  I will see you in 6 months.

## 2016-04-21 NOTE — Telephone Encounter (Signed)
Called pharm to see if they have tried running Rx for colchicine under generic, brand Colcrys and brand Mitigare. Usually one of the three will be covered. The pharm stated that it looks like something has changed w/pt's ins since they filled some Rxs on 10/2 and they do not have current ins to run it through. Called pt and advised to get in touch with pharm to verify current ins drug coverage and call if have any further questions after this.

## 2016-04-21 NOTE — Telephone Encounter (Signed)
What medication does he want to change to Allopurinol?

## 2016-04-21 NOTE — Telephone Encounter (Signed)
Please advise 

## 2016-04-21 NOTE — Telephone Encounter (Signed)
I am fine with watching off of allopurinol. If gout flares return, can discuss other management options.

## 2016-04-21 NOTE — Telephone Encounter (Signed)
Spoke to wife who stated that pt hasn't had a flare in a long time, is watching his diet, not drinking alcohol or sodas. Pt would like to just try not taking either medication unless he starts getting flares again. Dr Carlota Raspberry, [facility]. Let me know if this plan is not OK with you and I can call pt back.

## 2016-04-22 MED FILL — SERTRALINE HCL 100 MG TAB: 100 | 90 days supply | Qty: 135 | Fill #0 | Status: TO

## 2016-04-22 NOTE — Telephone Encounter (Signed)
Wife advised of Dr Mancel Bale response. Will monitor and call/see [place] if flare.

## 2016-04-27 ENCOUNTER — Ambulatory Visit (INDEPENDENT_AMBULATORY_CARE_PROVIDER_SITE_OTHER): Payer: Medicare Other | Admitting: Family Medicine

## 2016-04-27 ENCOUNTER — Encounter: Payer: Self-pay | Admitting: Family Medicine

## 2016-04-27 VITALS — BP 138/76 | HR 72 | Temp 98.3°F | Ht 67.5 in | Wt 221.6 lb

## 2016-04-27 DIAGNOSIS — Z961 Presence of intraocular lens: Secondary | ICD-10-CM | POA: Diagnosis not present

## 2016-04-27 DIAGNOSIS — I255 Ischemic cardiomyopathy: Secondary | ICD-10-CM | POA: Diagnosis not present

## 2016-04-27 DIAGNOSIS — R351 Nocturia: Secondary | ICD-10-CM

## 2016-04-27 DIAGNOSIS — L97521 Non-pressure chronic ulcer of other part of left foot limited to breakdown of skin: Secondary | ICD-10-CM | POA: Diagnosis not present

## 2016-04-27 DIAGNOSIS — E11621 Type 2 diabetes mellitus with foot ulcer: Secondary | ICD-10-CM | POA: Diagnosis not present

## 2016-04-27 DIAGNOSIS — Z794 Long term (current) use of insulin: Secondary | ICD-10-CM

## 2016-04-27 DIAGNOSIS — J449 Chronic obstructive pulmonary disease, unspecified: Secondary | ICD-10-CM | POA: Insufficient documentation

## 2016-04-27 DIAGNOSIS — H2511 Age-related nuclear cataract, right eye: Secondary | ICD-10-CM | POA: Diagnosis not present

## 2016-04-27 DIAGNOSIS — Z23 Encounter for immunization: Secondary | ICD-10-CM | POA: Diagnosis not present

## 2016-04-27 DIAGNOSIS — H02104 Unspecified ectropion of left upper eyelid: Secondary | ICD-10-CM | POA: Diagnosis not present

## 2016-04-27 DIAGNOSIS — E1142 Type 2 diabetes mellitus with diabetic polyneuropathy: Secondary | ICD-10-CM | POA: Insufficient documentation

## 2016-04-27 DIAGNOSIS — N183 Chronic kidney disease, stage 3 (moderate): Secondary | ICD-10-CM

## 2016-04-27 DIAGNOSIS — IMO0002 Reserved for concepts with insufficient information to code with codable children: Secondary | ICD-10-CM

## 2016-04-27 DIAGNOSIS — I442 Atrioventricular block, complete: Secondary | ICD-10-CM | POA: Diagnosis not present

## 2016-04-27 DIAGNOSIS — E1122 Type 2 diabetes mellitus with diabetic chronic kidney disease: Secondary | ICD-10-CM

## 2016-04-27 DIAGNOSIS — I5022 Chronic systolic (congestive) heart failure: Secondary | ICD-10-CM

## 2016-04-27 DIAGNOSIS — I25119 Atherosclerotic heart disease of native coronary artery with unspecified angina pectoris: Secondary | ICD-10-CM | POA: Diagnosis not present

## 2016-04-27 DIAGNOSIS — E1165 Type 2 diabetes mellitus with hyperglycemia: Secondary | ICD-10-CM

## 2016-04-27 DIAGNOSIS — N401 Enlarged prostate with lower urinary tract symptoms: Secondary | ICD-10-CM | POA: Insufficient documentation

## 2016-04-27 DIAGNOSIS — H16212 Exposure keratoconjunctivitis, left eye: Secondary | ICD-10-CM | POA: Diagnosis not present

## 2016-04-27 DIAGNOSIS — N184 Chronic kidney disease, stage 4 (severe): Secondary | ICD-10-CM | POA: Diagnosis not present

## 2016-04-27 DIAGNOSIS — G510 Bell's palsy: Secondary | ICD-10-CM | POA: Insufficient documentation

## 2016-04-27 NOTE — Assessment & Plan Note (Signed)
S: well controlled. On lantus 60 units, other options limited by kidney function Exercise and diet- some poor food choices and infrequent exercise- has a reason for avoiding biking (scared to get on bike), walking (scared to fall) but is thinking about doing some walking with his walker Lab Results  Component Value Date   HGBA1C 6.8 03/26/2016   HGBA1C 6.9 06/26/2015   HGBA1C 7.4 (H) 05/07/2015   A/P: continue lantus 60 units alone- advised 3 month follow up for a1c recheck

## 2016-04-27 NOTE — Assessment & Plan Note (Addendum)
S: Follows with cardiology [place]. Stent (DES) September 2016 xx 2- mild LAD, 2nd diagonal lesion. 70% circumflex treated medically has been the plan. He is on Dual antiplatelet- aspirin and plavix. He is also on statin for lipid control and BP meds to control HTN. Angina controlled by imdur A/P: continue current medicatoins as well as cardiology follow up.

## 2016-04-27 NOTE — Progress Notes (Signed)
Pre visit review using our clinic review tool, if applicable. No additional management support is needed unless otherwise documented below in the visit note. 

## 2016-04-27 NOTE — Assessment & Plan Note (Signed)
S: compliant with lasix 30mg  total daily. No increased edema, SOB. Stable SOB even despite improvement in EF A/P: continue current meds

## 2016-04-27 NOTE — Assessment & Plan Note (Signed)
S: Patients GFR has remained in 15-29 area largely over last year. Limited options for diabetes care as a result. Not on ace-I but is to see [facility].  A/P: hesitant to add ace-I at such level of depression without nephrology inpuut- continue BP, lipid, DM control and follow up [facility] notes.

## 2016-04-27 NOTE — Progress Notes (Addendum)
Phone: [phone]  Subjective:  Patient presents today to establish care. Chief complaint-noted.   See problem oriented charting  The following were reviewed and entered/updated in epic: Past Medical History:  Diagnosis Date  . Anginal pain ([place])   . Anxiety   . Arm pain 05/08/2015   LEFT ARM  . Atrioventricular block, complete ([place])    a. 2010 s/p pacemaker.  . Bell's palsy    left side. after shingles episode  . Chronic systolic CHF (congestive heart failure) ([place])    a. LVEF 35-40% by echo 09/2014.  [name] CKD (chronic kidney disease), stage IV ([place])   . COPD (chronic obstructive pulmonary disease) (HCC)    Severe  . Coronary artery disease    a. s/p MI in 1994/1995 while in [place] s/p questionable PCI. 03/2015: progression of disease, for staged PCI.  [name] Depression   . Diabetic peripheral neuropathy ([place])   . DOE (dyspnea on exertion) 03/28/2015  . GERD (gastroesophageal reflux disease)   . Hypercholesterolemia   . Hypertension   . MI (myocardial infarction) 1994; 1995  . Neuropathy (HCC)    IN LOWER EXTREMITIES  . Obesity   . Pacemaker    medtronic>>> MDT ICD 09/23/15  . Sleep apnea    "sleeps w/humidifyer when he panics and gets short of breath" (04/08/2015)  . TIA (transient ischemic attack) X 3  . Type II diabetes mellitus [facility])    Patient Active Problem List   Diagnosis Date Noted  . COPD (chronic obstructive pulmonary disease) ([place])     Priority: High  . Chronic systolic CHF (congestive heart failure) ([place]) 04/08/2015    Priority: High  . DOE (dyspnea on exertion), due to significant CAD 03/28/2015    Priority: High  . CKD (chronic kidney disease) stage 4, GFR 15-29 ml/min (HCC) 03/04/2015    Priority: High  . Coronary artery disease involving native coronary artery of native heart with angina pectoris ([place]) 09/13/2014    Priority: High  . Diabetes type 2, uncontrolled ([place]) 08/05/2012    Priority: High  . Ischemic cardiomyopathy 11/03/2011    Priority:  High  . Atrioventricular block, complete (HCC)     Priority: High  . Pacemaker     Priority: High  . BPH associated with nocturia 04/27/2016    Priority: Medium  . Diabetic peripheral neuropathy (HCC)     Priority: Medium  . Gout 06/26/2015    Priority: Medium  . Depression 08/05/2012    Priority: Medium  . Sleep apnea 08/05/2012    Priority: Medium  . HTN (hypertension) 08/05/2012    Priority: Medium  . Diverticulitis 04/08/2015    Priority: Low  . GERD (gastroesophageal reflux disease) 08/07/2012    Priority: Low  . Obesity     Priority: Low  . Bell's palsy    Past Surgical History:  Procedure Laterality Date  . CARDIAC CATHETERIZATION N/A 03/29/2015   Procedure: Right/Left Heart Cath and Coronary Angiography;  Surgeon: Evertte M [name], MD;  Location: [facility];  Service: Cardiovascular;  Laterality: N/A;  . [place]   "after my MI; put me on heart RX after cath"  . CARDIAC CATHETERIZATION N/A 04/09/2015   Procedure: Coronary Stent Intervention;  Surgeon: Ric M [name], MD;  Location: [facility];  Service: Cardiovascular;  Laterality: N/A;  . CHOLECYSTECTOMY OPEN  ~ 1977  . EP IMPLANTABLE DEVICE N/A 09/23/2015   MDT CRT-D, Dr. Caryl Comes  . HIATAL HERNIA REPAIR  1977  . INSERT /  REPLACE / REMOVE PACEMAKER  07/2008   Complete heart block status post DDD with good function  . TONSILLECTOMY      Family History  Problem Relation Age of Onset  . Stroke Mother   . Leukemia Father   . Stroke Sister   . Heart attack Brother     Medications- reviewed and updated Current Outpatient Prescriptions  Medication Sig Dispense Refill  . aspirin EC 81 MG tablet Take 81 mg by mouth daily.    [name] atorvastatin (LIPITOR) 40 MG tablet Take 1 tablet (40 mg total) by mouth daily. 90 tablet 3  . bacitracin ophthalmic ointment Place 1 application into both eyes at bedtime. Apply to eyelids  3  . carvedilol (COREG) 12.5 MG tablet Take 1 tablet (12.5 mg total)  by mouth 2 (two) times daily with a meal. 60 tablet 11  . clopidogrel (PLAVIX) 75 MG tablet TAKE 1 TABLET BY MOUTH DAILY WITH BREAKFAST 30 tablet 3  . colchicine 0.6 MG tablet Take 0.5 tablets (0.3 mg total) by mouth 3 (three) times daily. 90 tablet 3  . Eyelid Cleansers (AVENOVA) 0.01 % SOLN Apply 1 application topically 2 (two) times daily as needed.    . furosemide (LASIX) 20 MG tablet Take 1.5 tablets (30 mg total) by mouth daily. Take 1 and 1/2 tablets 30mg  daily 45 tablet 1  . hydrALAZINE (APRESOLINE) 25 MG tablet Take 1 tablet (25 mg total) by mouth 2 (two) times daily. 90 tablet 11  . HYDROcodone-acetaminophen (NORCO/VICODIN) 5-325 MG tablet Take 0.5-1 tablets by mouth every 6 (six) hours as needed for moderate pain. 10 tablet 0  . hydroxypropyl methylcellulose / hypromellose (ISOPTO TEARS / GONIOVISC) 2.5 % ophthalmic solution Place 1 drop into the left eye 4 (four) times daily as needed for dry eyes. 15 mL 12  . isosorbide mononitrate (IMDUR) 30 MG 24 hr tablet Take 1 tablet (30 mg total) by mouth daily. 30 tablet 6  . LANTUS SOLOSTAR 100 UNIT/ML Solostar Pen INJECT 60 UNITS UNDER THE SKIN ONCE DAILY 45 mL 2  . NITROSTAT 0.4 MG SL tablet PLACE 1 TABLET (0.4 MG TOTAL) UNDER THE TONGUE EVERY 5 (FIVE) MINUTES X 3 DOSES AS NEEDED FOR CHEST PAIN. 25 tablet 3  . OVER THE COUNTER MEDICATION Place 1 drop into both eyes 3 (three) times daily. OTC Bausch-Lomb eye bath    . pantoprazole (PROTONIX) 40 MG tablet Take 1 tablet (40 mg total) by mouth daily. 30 tablet 6  . sertraline (ZOLOFT) 100 MG tablet Take 1.5 tablets (150 mg total) by mouth daily. 135 tablet 1  . tamsulosin (FLOMAX) 0.4 MG CAPS capsule TAKE 1 CAPSULE BY MOUTH DAILY AFTER SUPPER 30 capsule 11  . UNIFINE PENTIPS 31G X 5 MM MISC USE AS DIRECTED 100 each 0  . vitamin B-12 (CYANOCOBALAMIN) 1000 MCG tablet Take 1,000 mcg by mouth daily.     No current facility-administered medications for this visit.     Allergies-reviewed and  updated Allergies  Allergen Reactions  . Bee Venom Anaphylaxis  . Lyrica [Pregabalin] Other (See Comments)    hallucinations  . Prednisone Other (See Comments)    hallucinations  . Zocor [Simvastatin] Nausea Only and Other (See Comments)    Headache with brand name only.  Can take the generic.    Social History   Social History  . Marital status: Married    Spouse name: N/A  . Number of children: 1  . Years of education: college   Occupational History  .  Retired    Social History Main Topics  . Smoking status: Former Smoker    Packs/day: 1.50    Years: 54.00    Types: Cigarettes    Quit date: 07/18/2007  . Smokeless tobacco: Never Used  . Alcohol use No     Comment: 04/08/2015 "probably 3 drinks/month"  . Drug use: No  . Sexual activity: No   Other Topics Concern  . None   Social History Narrative   Married 1994 (together since 1989) 1 son, 1 stepson. 3 grandkids, 6 great grandkids      Retired from [employer]. Had 2 years of collge.       Faith: [employer]    ROS--Full ROS was completed Review of Systems  Constitutional: Negative for chills and fever.  HENT: Positive for hearing loss.   Eyes: Positive for blurred vision (after bells palsy and dry eyes) and photophobia (after bells palsy and dry eyes).  Respiratory: Positive for shortness of breath. Negative for cough.   Cardiovascular: Negative for chest pain and palpitations.  Gastrointestinal: Negative for heartburn, nausea and vomiting.  Genitourinary: Positive for frequency and urgency. Negative for dysuria.  Musculoskeletal: Negative for myalgias and neck pain.  Skin: Positive for rash (on feet). Negative for itching.  Neurological: Negative for dizziness, tingling and headaches.  Endo/Heme/Allergies: Negative for polydipsia. Does not bruise/bleed easily.  Psychiatric/Behavioral: Negative for substance abuse and suicidal ideas.   Objective: BP 138/76 (BP Location: Left Arm, Patient Position: Sitting,  Cuff Size: Large)   Pulse 72   Temp 98.3 F (36.8 C) (Oral)   Ht 5' 7.5" (1.715 m)   Wt 221 lb 9.6 oz (100.5 kg)   SpO2 97%   BMI 34.20 kg/m  Gen: NAD, resting comfortably HEENT: Mucous membranes are moist. Oropharynx normal. TM normal. Eyes: sclera and lids normal, PERRLA Neck: no thyromegaly, no cervical lymphadenopathy CV: RRR no murmurs rubs or gallops Lungs: CTAB no crackles, wheeze, rhonchi Abdomen: soft/nontender/nondistended/normal bowel sounds. No rebound or guarding.  Ext: no edema, on left 3rd toe there is a small 3 mm ulceration on top of toe at DIP joint with no exposed bone, fat or underlying tissue- superficial Skin: warm, dry Neuro: 5/5 strength in upper and lower extremities, normal gait, normal reflexes  Assessment/Plan:  Coronary artery disease involving native coronary artery of native heart with angina pectoris ([place]) S: Follows with cardiology [facility]. Stent ([facility]) September 2016 xx 2- mild LAD, 2nd diagonal lesion. 70% circumflex treated medically has been the plan. He is on Dual antiplatelet- aspirin and plavix. He is also on statin for lipid control and BP meds to control HTN. Angina controlled by imdur A/P: continue current medicatoins as well as cardiology follow up.   Atrioventricular block, complete (HCC) S: complete AV block status post pacemaker A/P: paced rhythm- doing well. Continue cardiology follow up.    CKD (chronic kidney disease) stage 4, GFR 15-29 ml/min (HCC) S: Patients GFR has remained in 15-29 area largely over last year. Limited options for diabetes care as a result. Not on ace-I but is to see [facility].  A/P: hesitant to add ace-I at such level of depression without nephrology inpuut- continue BP, lipid, DM control and follow up [facility] notes.   Diabetes type 2, uncontrolled ([place]) S: well controlled. On lantus 60 units, other options limited by kidney function Exercise and diet- some poor food choices and infrequent exercise-  has a reason for avoiding biking (scared to get on bike), walking (scared to fall) but  is thinking about doing some walking with his walker Lab Results  Component Value Date   HGBA1C 6.8 03/26/2016   HGBA1C 6.9 06/26/2015   HGBA1C 7.4 (H) 05/07/2015   A/P: continue lantus 60 units alone- advised 3 month follow up for a1c recheck   Chronic systolic CHF (congestive heart failure) ([place]) S: compliant with lasix 30mg  total daily. No increased edema, SOB. Stable SOB even despite improvement in EF A/P: continue current meds   Podiatry - has some white scaling on bilateral feet as well as ulceration of left 3rd toe- discussed referral to podiatry and agrees   Return in about 3 months (around 07/28/2016).   Orders Placed This Encounter  Procedures  . Pneumococcal polysaccharide vaccine 23-valent greater than or equal to 2yo subcutaneous/IM  . Ambulatory referral to Podiatry    Referral Priority:   Routine    Referral Type:   Consultation    Referral Reason:   Specialty Services Required    Requested Specialty:   Podiatry    Number of Visits Requested:   1   Return precautions advised.  Garret Reddish, MD

## 2016-04-27 NOTE — Assessment & Plan Note (Signed)
S: complete AV block status post pacemaker A/P: paced rhythm- doing well. Continue cardiology follow up.

## 2016-04-27 NOTE — Patient Instructions (Addendum)
Final pneumonia shot- pneumovax 23 today  For ulcer on foot- We will call you within a week about your referral to podiatry. If you do not hear within 2 weeks, give [place] a call.  They can also advise you on the scaling on your skin  Make sure to follow through with kidney doctors and have them forward me bloodwork

## 2016-04-30 MED FILL — PANTOPRAZOLE SOD DR 40 MG T: 40 | 30 days supply | Qty: 30 | Fill #5 | Status: TO

## 2016-04-30 MED FILL — ISOSORBIDE MN ER 30 MG TAB: 30 | 30 days supply | Qty: 30 | Fill #5 | Status: TO

## 2016-05-01 ENCOUNTER — Other Ambulatory Visit: Payer: Self-pay

## 2016-05-01 MED ORDER — CLOPIDOGREL BISULFATE 75 MG PO TABS: 75.0000 mg | ORAL_TABLET | Freq: Every day | ORAL | 3 refills | Status: DC

## 2016-05-04 MED FILL — CLOPIDOGREL 75 MG TABLET: 75 | 30 days supply | Qty: 30 | Fill #0 | Status: TO

## 2016-05-05 ENCOUNTER — Ambulatory Visit (INDEPENDENT_AMBULATORY_CARE_PROVIDER_SITE_OTHER): Payer: Medicare Other | Admitting: Podiatry

## 2016-05-05 ENCOUNTER — Encounter: Payer: Self-pay | Admitting: Podiatry

## 2016-05-05 VITALS — BP 125/66 | HR 74 | Resp 16 | Ht 67.0 in | Wt 218.0 lb

## 2016-05-05 DIAGNOSIS — B351 Tinea unguium: Secondary | ICD-10-CM | POA: Diagnosis not present

## 2016-05-05 DIAGNOSIS — I255 Ischemic cardiomyopathy: Secondary | ICD-10-CM

## 2016-05-05 DIAGNOSIS — E1140 Type 2 diabetes mellitus with diabetic neuropathy, unspecified: Secondary | ICD-10-CM | POA: Diagnosis not present

## 2016-05-05 DIAGNOSIS — M79676 Pain in unspecified toe(s): Secondary | ICD-10-CM | POA: Diagnosis not present

## 2016-05-05 NOTE — Progress Notes (Signed)
   Subjective:    Patient ID: Rodney Farley, male    DOB: 02/03/1935, 82 y.o.   MRN: [id number]  HPI this patient presents the office with chief complaint of painful big toenails on both feet. He states he injured the big toenail on the left foot in the hospital and the nail avulsed.  He says his nails and his big toes are painful as he walks and wears his shoes. He also relates having a scaly area above hair. His right ankle and a scab on the third toe, left foot. He presents the office today for an evaluation and treatment of his feet    Review of Systems  All other systems reviewed and are negative.      Objective:   Physical Exam GENERAL APPEARANCE: Alert, conversant. Appropriately groomed. No acute distress.  VASCULAR: Pedal pulses are  palpable at  [facility] and PT bilateral.  Capillary refill time is immediate to all digits,  Normal temperature gradient.   NEUROLOGIC: sensation is normal to 5.07 monofilament at 5/5 sites bilateral.  Light touch is intact bilateral, Muscle strength normal.  MUSCULOSKELETAL: acceptable muscle strength, tone and stability bilateral.  Intrinsic muscluature intact bilateral.  Rectus appearance of foot and digits noted bilateral.   DERMATOLOGIC: skin color, texture, and turgor are within normal limits.  No preulcerative lesions or ulcers  are seen, no interdigital maceration noted.  No open lesions present. . No drainage noted. Peeling skin lesion above medial malleolus right ankle.  Crust noted third toe left foot dorsally with no redness or swelling noted.  NAILS  Thick disfigured discolored hallux nails  B/L         Assessment & Plan:  Onychomycosis  Hallux  B/L   IE  Debridement of nails  B/L  RTC 3 months   Gardiner Barefoot DPM

## 2016-05-08 ENCOUNTER — Ambulatory Visit: Payer: BLUE CROSS/BLUE SHIELD

## 2016-06-01 DIAGNOSIS — H16212 Exposure keratoconjunctivitis, left eye: Secondary | ICD-10-CM | POA: Diagnosis not present

## 2016-06-01 DIAGNOSIS — H2511 Age-related nuclear cataract, right eye: Secondary | ICD-10-CM | POA: Diagnosis not present

## 2016-06-01 DIAGNOSIS — H02104 Unspecified ectropion of left upper eyelid: Secondary | ICD-10-CM | POA: Diagnosis not present

## 2016-06-01 DIAGNOSIS — Z961 Presence of intraocular lens: Secondary | ICD-10-CM | POA: Diagnosis not present

## 2016-06-02 ENCOUNTER — Other Ambulatory Visit: Payer: Self-pay

## 2016-06-02 ENCOUNTER — Telehealth: Payer: Self-pay | Admitting: Family Medicine

## 2016-06-02 ENCOUNTER — Encounter: Payer: Self-pay | Admitting: Family Medicine

## 2016-06-02 ENCOUNTER — Ambulatory Visit (INDEPENDENT_AMBULATORY_CARE_PROVIDER_SITE_OTHER): Payer: PPO | Admitting: Family Medicine

## 2016-06-02 VITALS — BP 102/64 | HR 80 | Temp 98.4°F | Ht 67.5 in | Wt 221.8 lb

## 2016-06-02 DIAGNOSIS — M65331 Trigger finger, right middle finger: Secondary | ICD-10-CM

## 2016-06-02 MED ORDER — ATORVASTATIN CALCIUM 40 MG PO TABS: 40.0000 mg | ORAL_TABLET | Freq: Every day | ORAL | 3 refills | Status: DC

## 2016-06-02 NOTE — Patient Instructions (Signed)
We will call you within a week about your referral to sports medicine for trigger finger. If you do not hear within 2 weeks, give [place] a call.   Try splint at night- since you think the day may be too tough

## 2016-06-02 NOTE — Telephone Encounter (Signed)
Pharmacy called for pt to request a refill of atorvastatin (LIPITOR) 40 MG tablet  Dr [name] had last filled and they had been sending to the wrong pharmacy. They gave pt an emergency supply this past weekend.  [facility]

## 2016-06-02 NOTE — Progress Notes (Signed)
Subjective:  Rodney Farley is a 82 y.o. year old very pleasant male patient who presents for/with See problem oriented charting ROS- no numbness/tingling in hands. Does have pain at base of 3rd finger bilaterally. No rash over hand.see any ROS included in HPI as well.   Past Medical History-  Patient Active Problem List   Diagnosis Date Noted  . COPD (chronic obstructive pulmonary disease) ([place])     Priority: High  . Bell's palsy     Priority: High  . Chronic systolic CHF (congestive heart failure) ([place]) 04/08/2015    Priority: High  . DOE (dyspnea on exertion), due to significant CAD 03/28/2015    Priority: High  . CKD (chronic kidney disease) stage 4, GFR 15-29 ml/min (HCC) 03/04/2015    Priority: High  . Coronary artery disease involving native coronary artery of native heart with angina pectoris ([place]) 09/13/2014    Priority: High  . Diabetes type 2, uncontrolled ([place]) 08/05/2012    Priority: High  . Ischemic cardiomyopathy 11/03/2011    Priority: High  . Atrioventricular block, complete (HCC)     Priority: High  . Pacemaker     Priority: High  . BPH associated with nocturia 04/27/2016    Priority: Medium  . Diabetic peripheral neuropathy (HCC)     Priority: Medium  . Gout 06/26/2015    Priority: Medium  . Depression 08/05/2012    Priority: Medium  . Sleep apnea 08/05/2012    Priority: Medium  . HTN (hypertension) 08/05/2012    Priority: Medium  . Diverticulitis 04/08/2015    Priority: Low  . GERD (gastroesophageal reflux disease) 08/07/2012    Priority: Low  . Obesity     Priority: Low    Medications- reviewed and updated Current Outpatient Prescriptions  Medication Sig Dispense Refill  . aspirin EC 81 MG tablet Take 81 mg by mouth daily.    [name] atorvastatin (LIPITOR) 40 MG tablet Take 1 tablet (40 mg total) by mouth daily. 90 tablet 3  . bacitracin ophthalmic ointment Place 1 application into both eyes at bedtime. Apply to eyelids  3  . carvedilol  (COREG) 12.5 MG tablet Take 1 tablet (12.5 mg total) by mouth 2 (two) times daily with a meal. 60 tablet 11  . clopidogrel (PLAVIX) 75 MG tablet Take 1 tablet (75 mg total) by mouth daily with breakfast. 30 tablet 3  . Eyelid Cleansers (AVENOVA) 0.01 % SOLN Apply 1 application topically 2 (two) times daily as needed.    . furosemide (LASIX) 20 MG tablet Take 1.5 tablets (30 mg total) by mouth daily. Take 1 and 1/2 tablets 30mg  daily 45 tablet 1  . hydrALAZINE (APRESOLINE) 25 MG tablet Take 1 tablet (25 mg total) by mouth 2 (two) times daily. 90 tablet 11  . HYDROcodone-acetaminophen (NORCO/VICODIN) 5-325 MG tablet Take 0.5-1 tablets by mouth every 6 (six) hours as needed for moderate pain. 10 tablet 0  . hydroxypropyl methylcellulose / hypromellose (ISOPTO TEARS / GONIOVISC) 2.5 % ophthalmic solution Place 1 drop into the left eye 4 (four) times daily as needed for dry eyes. 15 mL 12  . isosorbide mononitrate (IMDUR) 30 MG 24 hr tablet Take 1 tablet (30 mg total) by mouth daily. 30 tablet 6  . LANTUS SOLOSTAR 100 UNIT/ML Solostar Pen INJECT 60 UNITS UNDER THE SKIN ONCE DAILY 45 mL 2  . NITROSTAT 0.4 MG SL tablet PLACE 1 TABLET (0.4 MG TOTAL) UNDER THE TONGUE EVERY 5 (FIVE) MINUTES X 3 DOSES AS NEEDED FOR CHEST  PAIN. 25 tablet 3  . OVER THE COUNTER MEDICATION Place 1 drop into both eyes 3 (three) times daily. OTC Bausch-Lomb eye bath    . pantoprazole (PROTONIX) 40 MG tablet Take 1 tablet (40 mg total) by mouth daily. 30 tablet 6  . sertraline (ZOLOFT) 100 MG tablet Take 1.5 tablets (150 mg total) by mouth daily. 135 tablet 1  . tamsulosin (FLOMAX) 0.4 MG CAPS capsule TAKE 1 CAPSULE BY MOUTH DAILY AFTER SUPPER 30 capsule 11  . UNIFINE PENTIPS 31G X 5 MM MISC USE AS DIRECTED 100 each 0  . vitamin B-12 (CYANOCOBALAMIN) 1000 MCG tablet Take 1,000 mcg by mouth daily.     No current facility-administered medications for this visit.     Objective: BP 102/64 (BP Location: Left Arm, Patient Position:  Sitting, Cuff Size: Large)   Pulse 80   Temp 98.4 F (36.9 C) (Oral)   Ht 5' 7.5" (1.715 m)   Wt 221 lb 12.8 oz (100.6 kg)   SpO2 96%   BMI 34.23 kg/m  Gen: NAD, resting comfortably, appears uncomfortable when moving fingers MSK: patient with palpable nodule around base of 3rd MCP on each hand. palpable click as flexes fingers in and fingers become "stuck". No bruising on hand.  Skin: warm, dry, no rash on hands Neuro: intact distal sensation  Assessment/Plan:   Trigger finger- left and right 3rd finger S:Bilateral 3rd finger with pain- feels a clicking and finger gets stuck when closing hand and then pops open when opens hand. 2 weeks progressive moderate pain. Thought it would be best to continue to open and close hand but each time seemed to get mildly worse. Has tried tylenol-helps some. Has to avoid nsaids with his CAD history.  A/P: We set patient up with a splint for his hand but only wants to use at night. Cannot do nsaids. Considered prednisone but suspect more targeted therapy advisable. Refer to sports medicine Dr. [name]- patient wants to wait to be seen next week after the holiday. Discussed consideration is for injection and may be ultrasound guided. A1c has been controlled with last one at 6.8 in September so think steroid therapy would be reasonable.   Orders Placed This Encounter  Procedures  . Ambulatory referral to Orthopedics    Referral Priority:   Routine    Referral Type:   Consultation    Number of Visits Requested:   1   Return precautions advised.  Garret Reddish, MD

## 2016-06-02 NOTE — Telephone Encounter (Signed)
Prescription for Lipitor sent to [facility] as requested

## 2016-06-02 NOTE — Progress Notes (Signed)
Pre visit review using our clinic review tool, if applicable. No additional management support is needed unless otherwise documented below in the visit note. 

## 2016-06-10 ENCOUNTER — Ambulatory Visit (INDEPENDENT_AMBULATORY_CARE_PROVIDER_SITE_OTHER): Payer: PPO | Admitting: Sports Medicine

## 2016-06-10 ENCOUNTER — Encounter (INDEPENDENT_AMBULATORY_CARE_PROVIDER_SITE_OTHER): Payer: Self-pay | Admitting: Sports Medicine

## 2016-06-10 ENCOUNTER — Encounter: Payer: Self-pay | Admitting: Family Medicine

## 2016-06-10 VITALS — BP 157/76 | HR 77 | Temp 97.6°F | Ht 67.0 in | Wt 220.0 lb

## 2016-06-10 DIAGNOSIS — M65332 Trigger finger, left middle finger: Secondary | ICD-10-CM

## 2016-06-10 DIAGNOSIS — E1165 Type 2 diabetes mellitus with hyperglycemia: Secondary | ICD-10-CM | POA: Diagnosis not present

## 2016-06-10 DIAGNOSIS — M65331 Trigger finger, right middle finger: Secondary | ICD-10-CM | POA: Diagnosis not present

## 2016-06-10 DIAGNOSIS — N183 Chronic kidney disease, stage 3 (moderate): Secondary | ICD-10-CM | POA: Diagnosis not present

## 2016-06-10 DIAGNOSIS — Z7901 Long term (current) use of anticoagulants: Secondary | ICD-10-CM | POA: Diagnosis not present

## 2016-06-10 DIAGNOSIS — E1122 Type 2 diabetes mellitus with diabetic chronic kidney disease: Secondary | ICD-10-CM

## 2016-06-10 DIAGNOSIS — M6530 Trigger finger, unspecified finger: Secondary | ICD-10-CM | POA: Insufficient documentation

## 2016-06-10 DIAGNOSIS — Z794 Long term (current) use of insulin: Secondary | ICD-10-CM

## 2016-06-10 DIAGNOSIS — IMO0002 Reserved for concepts with insufficient information to code with codable children: Secondary | ICD-10-CM

## 2016-06-10 MED ORDER — LIDOCAINE HCL 1 % IJ SOLN
0.5000 mL | INTRAMUSCULAR | Status: AC | PRN
  Administered 2016-06-10: .5 mL

## 2016-06-10 MED ORDER — METHYLPREDNISOLONE ACETATE 40 MG/ML IJ SUSP
20.0000 mg | INTRAMUSCULAR | Status: AC | PRN
  Administered 2016-06-10: 20 mg

## 2016-06-10 MED ORDER — BUPIVACAINE HCL 0.5 % IJ SOLN
0.5000 mL | INTRAMUSCULAR | Status: AC | PRN
  Administered 2016-06-10: .5 mL

## 2016-06-10 NOTE — Patient Instructions (Signed)
I am transferring practices as of January 1st  to  Primary Care & Sports Medicine at [facility] [place].  This is a great opportunity & I am saddened to be leaving [facility] however & excited for new opportunities. I will continue to be seeing patients at [facility] through the end of December. I am happy to see you at the new location but also am confident that you are in great hands with the excellent providers here at [facility].  We are not currently scheduling patients at the new location at this time but if you look on [facility] website a contact information should be available there closer to January. Additionally [email or web address] will have information when it becomes available.    The telephone number will be [phone]  - Nobody will be answering this phone number until closer to January. 

## 2016-06-10 NOTE — Progress Notes (Signed)
Rodney Farley - 82 y.o. male MRN [id number]  Date of birth: 20-Oct-1931  Office Visit Note: Visit Date: 06/10/2016 PCP: Garret Reddish, MD Referred by: [name], MD  Subjective: Chief Complaint  Patient presents with  . Right Middle Finger - Pain    Trigger finger  . Left Middle Finger - Pain    Trigger finger   HPI: Patient reports significant pain & discomfort & inability to open & close his hands especially first thing in the morning. This is been present for several weeks. Referred here by Dr. Rushie Chestnut for consideration of ultrasound guided injection for likely symptomatic trigger finger. Patient is on chronic anticoagulation but is otherwise being managed quite well from his chronic medical conditions & reports this is one of the biggest limitations he has currently in his life. Reports having pain so severe this morning when trying to open his hand that he had to force fully extend his finger. This does tend to initially cause severe pain but does resolve quickly. He denies any new or different symptoms with hand other than this. No prior significant injuries to the hand. His right hand is worse than the left hand. ROS: Denies any fevers chills recent weight gain or weight loss.. Otherwise per HPI.   Clinical History: No specialty comments available.  He reports that he quit smoking about 8 years ago. His smoking use included Cigarettes. He has a 81.00 pack-year smoking history. He has never used smokeless tobacco.   Recent Labs  06/26/15 1103 06/26/15 1137 03/26/16 1423  HGBA1C  --  6.9 6.8  LABURIC 6.9  --   --     Assessment & Plan: Visit Diagnoses:    ICD-9-CM ICD-10-CM   1. Trigger middle finger of right hand 727.03 M65.331 Hand/Upper Extremity Injection/Arthrocentesis  2. Trigger middle finger of left hand 727.03 M65.332 Hand/Upper Extremity Injection/Arthrocentesis  3. Current use of long term anticoagulation - Plavix V58.61 Z79.01 Hand/Upper  Extremity Injection/Arthrocentesis     Hand/Upper Extremity Injection/Arthrocentesis  4. Uncontrolled type 2 diabetes mellitus with stage 3 chronic kidney disease, with long-term current use of insulin (HCC) 250.52 E11.22    585.3 E11.65    V58.67 N18.3     Z79.4     Plan: Bilateral ultrasound-guided trigger finger injections performed as below. Patient had almost immediate relief of his pain with improved range of motion of both fingers. Triggering was still present following the injection as expected but did improve. He had marked stenosing of the right third flexor compartment at the A1 pulley which had good improvement in dilation with injection. Left had narrowing but did have improved function & motion compared to the right.  We did discuss the possibility of slightly increasing his blood sugars due to the steroid however feel it is appropriate with the control he currently has to proceed. Follow-up: Return if symptoms worsen or fail to improve.  Meds: No orders of the defined types were placed in this encounter. Right ultrasound-guided trigger finger injection Performed by: Teresa Coombs D Authorized by: Teresa Coombs D   Condition: trigger finger   Location:  Long finger Needle Size:  25 G Ultrasound Guidance: Yes   Medications:  0.5 mL bupivacaine 0.5 %; 0.5 mL lidocaine 1 %; 20 mg methylPREDNISolone acetate 40 MG/ML Comments: [name] clinical condition is marked by substantial pain and/or significant functional disability. Other conservative therapy has not provided relief, is contraindicated, or not appropriate. There is a reasonable likelihood that injection will significantly  improve the patient's pain and/or functional impairment.  After discussing the risks, benefits and expected outcomes of the injection and all questions were reviewed and answered, the patient wished to undergo the above named procedure.  Verbal consent was obtained. The target sight was prepped with  alcohol scrub. Local anesthesia was obtained with ethyl chloride.  Under real-time ultrasound guidance, Injection of the target structure was performed using the above needle and medications under sterile technique. Band-Aid was applied. The patient tolerated this procedure well with no immediate complications. Post injection instructions were provided.    Left ultrasound-guided trigger finger injection Performed by: Teresa Coombs D Authorized by: Teresa Coombs D   Indications:  Therapeutic Condition: trigger finger   Location:  Long finger Needle Size:  25 G Ultrasound Guidance: Yes   Medications:  0.5 mL lidocaine 1 %; 0.5 mL bupivacaine 0.5 %; 20 mg methylPREDNISolone acetate 40 MG/ML Comments: [name] clinical condition is marked by substantial pain and/or significant functional disability. Other conservative therapy has not provided relief, is contraindicated, or not appropriate. There is a reasonable likelihood that injection will significantly improve the patient's pain and/or functional impairment.  After discussing the risks, benefits and expected outcomes of the injection and all questions were reviewed and answered, the patient wished to undergo the above named procedure.  Verbal consent was obtained. The target sight was prepped with alcohol scrub. Local anesthesia was obtained with ethyl chloride.  Under real-time ultrasound guidance, Injection of the target structure was performed using the above needle and medications under sterile technique. Band-Aid was applied. The patient tolerated this procedure well with no immediate complications. Post injection instructions were provided.       Objective:  VS:  HT:5\' 7"  (170.2 cm)   WT:220 lb (99.8 kg)  BMI:34.5    BP:(!) 157/76  HR:77bpm  TEMP:97.6 F (36.4 C)(Oral)  RESP:  Physical Exam: Adult male. In no acute distress. Alert and appropriate. Bilateral hands are overall well aligned. He has moderate degree of bossing of  the MCPs IP's & PIP joints. He has marked triggering of the third finger bilaterally with marked pain over the A1 pulley. Sensation is hand is intact. Radial pulses are normal. Good capillary refill. No overlying skin changes. Imaging: No results found.  Past Medical/Family/Surgical/Social History: Medications & Allergies reviewed per EMR Patient Active Problem List   Diagnosis Date Noted  . Current use of long term anticoagulation - Plavix 06/10/2016  . Trigger finger 06/10/2016  . BPH associated with nocturia 04/27/2016  . Diabetic peripheral neuropathy ([place])   . COPD (chronic obstructive pulmonary disease) ([place])   . Bell's palsy   . Gout 06/26/2015  . Chronic systolic CHF (congestive heart failure) ([place]) 04/08/2015  . Diverticulitis 04/08/2015  . DOE (dyspnea on exertion), due to significant CAD 03/28/2015  . CKD (chronic kidney disease) stage 4, GFR 15-29 ml/min (HCC) 03/04/2015  . Coronary artery disease involving native coronary artery of native heart with angina pectoris ([place]) 09/13/2014  . GERD (gastroesophageal reflux disease) 08/07/2012  . Depression 08/05/2012  . Sleep apnea 08/05/2012  . Diabetes type 2, uncontrolled ([place]) 08/05/2012  . HTN (hypertension) 08/05/2012  . Ischemic cardiomyopathy 11/03/2011  . Atrioventricular block, complete ([place])   . Obesity   . Pacemaker    Past Medical History:  Diagnosis Date  . Anginal pain ([place])   . Anxiety   . Arm pain 05/08/2015   LEFT ARM  . Atrioventricular block, complete ([place])    a. 2010 s/p pacemaker.  [name]  Bell's palsy    left side. after shingles episode  . Chronic systolic CHF (congestive heart failure) ([place])    a. LVEF 35-40% by echo 09/2014.  [name] CKD (chronic kidney disease), stage IV ([place])   . COPD (chronic obstructive pulmonary disease) (HCC)    Severe  . Coronary artery disease    a. s/p MI in 1994/1995 while in [place] s/p questionable PCI. 03/2015: progression of disease, for staged PCI.  [name] Depression   .  Diabetic peripheral neuropathy ([place])   . DOE (dyspnea on exertion) 03/28/2015  . GERD (gastroesophageal reflux disease)   . Hypercholesterolemia   . Hypertension   . MI (myocardial infarction) 1994; 1995  . Neuropathy (HCC)    IN LOWER EXTREMITIES  . Obesity   . Pacemaker    medtronic>>> MDT ICD 09/23/15  . Sleep apnea    "sleeps w/humidifyer when he panics and gets short of breath" (04/08/2015)  . TIA (transient ischemic attack) X 3  . Type II diabetes mellitus (HCC)    Family History  Problem Relation Age of Onset  . Stroke Mother   . Leukemia Father   . Stroke Sister   . Heart attack Brother    Past Surgical History:  Procedure Laterality Date  . CARDIAC CATHETERIZATION N/A 03/29/2015   Procedure: Right/Left Heart Cath and Coronary Angiography;  Surgeon: Kamaree M [name], MD;  Location: [facility];  Service: Cardiovascular;  Laterality: N/A;  . [place]   "after my MI; put me on heart RX after cath"  . CARDIAC CATHETERIZATION N/A 04/09/2015   Procedure: Coronary Stent Intervention;  Surgeon: Germany M [name], MD;  Location: [facility];  Service: Cardiovascular;  Laterality: N/A;  . CHOLECYSTECTOMY OPEN  ~ 1977  . EP IMPLANTABLE DEVICE N/A 09/23/2015   MDT CRT-D, Dr. Caryl Comes  . HIATAL HERNIA REPAIR  1977  . INSERT / REPLACE / REMOVE PACEMAKER  07/2008   Complete heart block status post DDD with good function  . TONSILLECTOMY     Social History   Occupational History  . Retired    Social History Main Topics  . Smoking status: Former Smoker    Packs/day: 1.50    Years: 54.00    Types: Cigarettes    Quit date: 07/18/2007  . Smokeless tobacco: Never Used  . Alcohol use No     Comment: 04/08/2015 "probably 3 drinks/month"  . Drug use: No  . Sexual activity: No

## 2016-06-10 NOTE — Progress Notes (Signed)
Patient presents today with triggering bilateral middle fingers. He states this has been developing over the past 3 weeks. Patient states both hands are about the same, at times one is worse than the other. Patient is right hand dominant. He has pain and is at times effecting ADL's.

## 2016-06-14 ENCOUNTER — Encounter (INDEPENDENT_AMBULATORY_CARE_PROVIDER_SITE_OTHER): Payer: Self-pay | Admitting: Sports Medicine

## 2016-06-17 ENCOUNTER — Telehealth (INDEPENDENT_AMBULATORY_CARE_PROVIDER_SITE_OTHER): Payer: Self-pay

## 2016-06-17 NOTE — Telephone Encounter (Signed)
Message was received from my chart from patient.  Talked with patient concerning trigger finger worsening.  Patient has appointment scheduled for 06/24/16.

## 2016-06-23 DIAGNOSIS — Z961 Presence of intraocular lens: Secondary | ICD-10-CM | POA: Diagnosis not present

## 2016-06-23 DIAGNOSIS — H02234 Paralytic lagophthalmos left upper eyelid: Secondary | ICD-10-CM | POA: Diagnosis not present

## 2016-06-23 DIAGNOSIS — Z888 Allergy status to other drugs, medicaments and biological substances status: Secondary | ICD-10-CM | POA: Diagnosis not present

## 2016-06-23 DIAGNOSIS — Z9842 Cataract extraction status, left eye: Secondary | ICD-10-CM | POA: Diagnosis not present

## 2016-06-23 DIAGNOSIS — G510 Bell's palsy: Secondary | ICD-10-CM | POA: Diagnosis not present

## 2016-06-23 DIAGNOSIS — H02105 Unspecified ectropion of left lower eyelid: Secondary | ICD-10-CM | POA: Diagnosis not present

## 2016-06-23 DIAGNOSIS — H2511 Age-related nuclear cataract, right eye: Secondary | ICD-10-CM | POA: Diagnosis not present

## 2016-06-23 DIAGNOSIS — Z87891 Personal history of nicotine dependence: Secondary | ICD-10-CM | POA: Diagnosis not present

## 2016-06-23 DIAGNOSIS — H169 Unspecified keratitis: Secondary | ICD-10-CM | POA: Diagnosis not present

## 2016-06-24 ENCOUNTER — Encounter (INDEPENDENT_AMBULATORY_CARE_PROVIDER_SITE_OTHER): Payer: Self-pay | Admitting: Sports Medicine

## 2016-06-24 ENCOUNTER — Ambulatory Visit (INDEPENDENT_AMBULATORY_CARE_PROVIDER_SITE_OTHER): Payer: PPO | Admitting: Sports Medicine

## 2016-06-24 VITALS — BP 113/68 | HR 86 | Ht 67.0 in | Wt 220.0 lb

## 2016-06-24 DIAGNOSIS — M65331 Trigger finger, right middle finger: Secondary | ICD-10-CM | POA: Diagnosis not present

## 2016-06-24 DIAGNOSIS — M65332 Trigger finger, left middle finger: Secondary | ICD-10-CM | POA: Diagnosis not present

## 2016-06-24 DIAGNOSIS — Z7901 Long term (current) use of anticoagulants: Secondary | ICD-10-CM

## 2016-06-24 NOTE — Patient Instructions (Signed)
I am transferring practices as of January 1st  to [facility] at [place].  This is a great opportunity & I am saddened to be leaving [facility] however & excited for new opportunities. I will continue to be seeing patients at [facility] through the end of December. I am happy to see you at the new location but also am confident that you are in great hands with the excellent providers here at [facility].  We are not currently scheduling patients at the new location at this time but if you look on [facility] website a contact information should be available there closer to January. Additionally [email or web address] will have information when it becomes available.    The telephone number will be [phone]  - Nobody will be answering this phone number until closer to January. 

## 2016-06-29 NOTE — Progress Notes (Signed)
Rodney Farley - 82 y.o. male MRN [id number]  Date of birth: 07-26-1931  Office Visit Note: Visit Date: 06/24/2016 PCP: Garret Reddish, MD Referred by: [name], MD  Subjective: Chief Complaint  Patient presents with  . Right Hand - Follow-up  . Left Hand - Follow-up  . Follow-up    Patient states injection helped for 2 days.  States pain has come back in both hands. Slightly in the left hand, but right hand is worse.   HPI: Patient is here for recheck of bilateral trigger finger.  Overall he is happy with 40% improvement in symptoms although would like further improvement if possible.  He reports having only minimal pain first thing in the morning but does continue to have to manually open his hand.  He does not want to discuss splinting as he does not think he will be compliant with this.  Otherwise overall doing well with no specific concerns today. ROS: Otherwise per HPI.   Clinical History: No specialty comments available.  He reports that he quit smoking about 8 years ago. His smoking use included Cigarettes. He has a 81.00 pack-year smoking history. He has never used smokeless tobacco.   Recent Labs  03/26/16 1423  HGBA1C 6.8    Assessment & Plan: Visit Diagnoses: No diagnosis found.  Plan: He has made 40% improvement subjectively and he does have significant less triggering on exam today.  We discussed the options for repeat injections at 6 weeks he would like to plan to pursue this.  We'll plan to follow-up in my new location and have given him instructions for scheduling.  We will also try to reach out to him and discuss the if he has not heard anything before the end of the year to call us on his own.  Can consider over-the-counter Aspercreme versus NSAID but he would like to try the over-the-counter option first. Follow-up: No Follow-up on file.  As above Meds: No orders of the defined types were placed in this encounter.  Procedures: No notes on file     Objective:  VS:  HT:5\' 7"  (170.2 cm)   WT:220 lb (99.8 kg)  BMI:34.5    BP:113/68  HR:86bpm  TEMP: ( )  RESP:  Physical Exam:   Bilateral hands with slight flexure contractures of all digits but minimal.  Very large hands but minimal osteoarthritic [place].  He does have a small amount of residual pain over the A1 pulley bilaterally of his third finger.  He does have a small degree of triggering with flexion and extension of the fingers  Imaging: No results found.  Past Medical/Family/Surgical/Social History: Medications & Allergies reviewed per EMR Patient Active Problem List   Diagnosis Date Noted  . Current use of long term anticoagulation - Plavix 06/10/2016  . Trigger finger 06/10/2016  . BPH associated with nocturia 04/27/2016  . Diabetic peripheral neuropathy ([place])   . COPD (chronic obstructive pulmonary disease) ([place])   . Bell's palsy   . Gout 06/26/2015  . Chronic systolic CHF (congestive heart failure) ([place]) 04/08/2015  . Diverticulitis 04/08/2015  . DOE (dyspnea on exertion), due to significant CAD 03/28/2015  . CKD (chronic kidney disease) stage 4, GFR 15-29 ml/min (HCC) 03/04/2015  . Coronary artery disease involving native coronary artery of native heart with angina pectoris ([place]) 09/13/2014  . GERD (gastroesophageal reflux disease) 08/07/2012  . Depression 08/05/2012  . Sleep apnea 08/05/2012  . Diabetes type 2, uncontrolled ([place]) 08/05/2012  . HTN (hypertension) 08/05/2012  .  Ischemic cardiomyopathy 11/03/2011  . Atrioventricular block, complete ([place])   . Obesity   . Pacemaker    Past Medical History:  Diagnosis Date  . Anginal pain ([place])   . Anxiety   . Arm pain 05/08/2015   LEFT ARM  . Atrioventricular block, complete ([place])    a. 2010 s/p pacemaker.  . Bell's palsy    left side. after shingles episode  . Chronic systolic CHF (congestive heart failure) ([place])    a. LVEF 35-40% by echo 09/2014.  [name] CKD (chronic kidney disease), stage IV ([place])   . COPD  (chronic obstructive pulmonary disease) (HCC)    Severe  . Coronary artery disease    a. s/p MI in 1994/1995 while in [place] s/p questionable PCI. 03/2015: progression of disease, for staged PCI.  [name] Depression   . Diabetic peripheral neuropathy ([place])   . DOE (dyspnea on exertion) 03/28/2015  . GERD (gastroesophageal reflux disease)   . Hypercholesterolemia   . Hypertension   . MI (myocardial infarction) 1994; 1995  . Neuropathy (HCC)    IN LOWER EXTREMITIES  . Obesity   . Pacemaker    medtronic>>> MDT ICD 09/23/15  . Sleep apnea    "sleeps w/humidifyer when he panics and gets short of breath" (04/08/2015)  . TIA (transient ischemic attack) X 3  . Type II diabetes mellitus (HCC)    Family History  Problem Relation Age of Onset  . Stroke Mother   . Leukemia Father   . Stroke Sister   . Heart attack Brother    Past Surgical History:  Procedure Laterality Date  . CARDIAC CATHETERIZATION N/A 03/29/2015   Procedure: Right/Left Heart Cath and Coronary Angiography;  Surgeon: Bryse M [name], MD;  Location: [facility];  Service: Cardiovascular;  Laterality: N/A;  . [place]   "after my MI; put me on heart RX after cath"  . CARDIAC CATHETERIZATION N/A 04/09/2015   Procedure: Coronary Stent Intervention;  Surgeon: Abhiram M [name], MD;  Location: [facility];  Service: Cardiovascular;  Laterality: N/A;  . CHOLECYSTECTOMY OPEN  ~ 1977  . EP IMPLANTABLE DEVICE N/A 09/23/2015   MDT CRT-D, Dr. Caryl Comes  . HIATAL HERNIA REPAIR  1977  . INSERT / REPLACE / REMOVE PACEMAKER  07/2008   Complete heart block status post DDD with good function  . TONSILLECTOMY     Social History   Occupational History  . Retired    Social History Main Topics  . Smoking status: Former Smoker    Packs/day: 1.50    Years: 54.00    Types: Cigarettes    Quit date: 07/18/2007  . Smokeless tobacco: Never Used  . Alcohol use No     Comment: 04/08/2015 "probably 3 drinks/month"  . Drug  use: No  . Sexual activity: No

## 2016-07-07 ENCOUNTER — Ambulatory Visit (INDEPENDENT_AMBULATORY_CARE_PROVIDER_SITE_OTHER): Payer: PPO | Admitting: *Deleted

## 2016-07-07 ENCOUNTER — Telehealth: Payer: Self-pay | Admitting: Cardiology

## 2016-07-07 DIAGNOSIS — I442 Atrioventricular block, complete: Secondary | ICD-10-CM | POA: Diagnosis not present

## 2016-07-07 NOTE — Telephone Encounter (Signed)
New Message:     Please call,having problem transmitting.

## 2016-07-07 NOTE — Telephone Encounter (Signed)
LMOVM reminding pt to send remote transmission.   

## 2016-07-07 NOTE — Telephone Encounter (Signed)
Spoke w/ pt wife and was able to trouble shoot monitor. Pt is sending transmission at this time.

## 2016-07-08 NOTE — Progress Notes (Signed)
Remote ICD transmission.   

## 2016-07-09 LAB — CUP PACEART REMOTE DEVICE CHECK
Battery Remaining Longevity: 70 mo
Battery Voltage: 2.98 V
Brady Statistic AS VS Percent: 0 %
Date Time Interrogation Session: 20171226200611
HIGH POWER IMPEDANCE MEASURED VALUE: 69 Ohm
Implantable Lead Implant Date: 20170313
Implantable Lead Location: 753859
Implantable Lead Location: 753860
Implantable Lead Model: 4598
Lead Channel Impedance Value: 361 Ohm
Lead Channel Impedance Value: 475 Ohm
Lead Channel Impedance Value: 532 Ohm
Lead Channel Impedance Value: 608 Ohm
Lead Channel Impedance Value: 874 Ohm
Lead Channel Impedance Value: 893 Ohm
Lead Channel Impedance Value: 893 Ohm
Lead Channel Pacing Threshold Amplitude: 1.25 V
Lead Channel Pacing Threshold Pulse Width: 0.4 ms
Lead Channel Sensing Intrinsic Amplitude: 1.625 mV
Lead Channel Sensing Intrinsic Amplitude: 13.5 mV
Lead Channel Sensing Intrinsic Amplitude: 13.5 mV
Lead Channel Setting Pacing Amplitude: 2.5 V
Lead Channel Setting Pacing Amplitude: 3 V
Lead Channel Setting Pacing Pulse Width: 0.4 ms
Lead Channel Setting Pacing Pulse Width: 0.4 ms
MDC IDC LEAD IMPLANT DT: 20100112
MDC IDC LEAD IMPLANT DT: 20170313
MDC IDC LEAD LOCATION: 753858
MDC IDC MSMT LEADCHNL LV IMPEDANCE VALUE: 475 Ohm
MDC IDC MSMT LEADCHNL LV IMPEDANCE VALUE: 513 Ohm
MDC IDC MSMT LEADCHNL LV IMPEDANCE VALUE: 532 Ohm
MDC IDC MSMT LEADCHNL LV IMPEDANCE VALUE: 893 Ohm
MDC IDC MSMT LEADCHNL LV IMPEDANCE VALUE: 950 Ohm
MDC IDC MSMT LEADCHNL RA IMPEDANCE VALUE: 418 Ohm
MDC IDC MSMT LEADCHNL RA PACING THRESHOLD AMPLITUDE: 1.25 V
MDC IDC MSMT LEADCHNL RA PACING THRESHOLD PULSEWIDTH: 0.4 ms
MDC IDC MSMT LEADCHNL RA SENSING INTR AMPL: 1.625 mV
MDC IDC MSMT LEADCHNL RV PACING THRESHOLD AMPLITUDE: 0.625 V
MDC IDC MSMT LEADCHNL RV PACING THRESHOLD PULSEWIDTH: 0.4 ms
MDC IDC PG IMPLANT DT: 20170313
MDC IDC SET LEADCHNL RA PACING AMPLITUDE: 2.5 V
MDC IDC SET LEADCHNL RV SENSING SENSITIVITY: 0.3 mV
MDC IDC STAT BRADY AP VP PERCENT: 97.43 %
MDC IDC STAT BRADY AP VS PERCENT: 0.01 %
MDC IDC STAT BRADY AS VP PERCENT: 2.56 %
MDC IDC STAT BRADY RA PERCENT PACED: 97.4 %
MDC IDC STAT BRADY RV PERCENT PACED: 99.93 %

## 2016-07-10 ENCOUNTER — Encounter: Payer: Self-pay | Admitting: Cardiology

## 2016-07-12 ENCOUNTER — Other Ambulatory Visit: Payer: Self-pay | Admitting: Cardiology

## 2016-07-12 DIAGNOSIS — I5022 Chronic systolic (congestive) heart failure: Secondary | ICD-10-CM

## 2016-07-14 NOTE — Telephone Encounter (Signed)
Rx request sent to pharmacy.  

## 2016-07-19 ENCOUNTER — Other Ambulatory Visit: Payer: Self-pay | Admitting: Family Medicine

## 2016-07-19 DIAGNOSIS — F329 Major depressive disorder, single episode, unspecified: Secondary | ICD-10-CM

## 2016-07-19 DIAGNOSIS — F32A Depression, unspecified: Secondary | ICD-10-CM

## 2016-07-20 NOTE — Telephone Encounter (Signed)
SS refill req Zoloft [place] refilled last time Pts PCP-Dr Hunter Please advise

## 2016-07-22 NOTE — Telephone Encounter (Signed)
If he is now followed by Dr. Yong Channel, he should be the one to refill medication in the future. I do not mind sending in temporary refill , but should be from Dr. Yong Channel next time.

## 2016-08-10 ENCOUNTER — Other Ambulatory Visit: Payer: Self-pay | Admitting: Cardiology

## 2016-08-11 ENCOUNTER — Ambulatory Visit: Payer: Medicare Other | Admitting: Podiatry

## 2016-09-01 DIAGNOSIS — H02104 Unspecified ectropion of left upper eyelid: Secondary | ICD-10-CM | POA: Diagnosis not present

## 2016-09-01 DIAGNOSIS — Z961 Presence of intraocular lens: Secondary | ICD-10-CM | POA: Diagnosis not present

## 2016-09-01 DIAGNOSIS — H16212 Exposure keratoconjunctivitis, left eye: Secondary | ICD-10-CM | POA: Diagnosis not present

## 2016-09-01 DIAGNOSIS — H2511 Age-related nuclear cataract, right eye: Secondary | ICD-10-CM | POA: Diagnosis not present

## 2016-09-07 ENCOUNTER — Other Ambulatory Visit: Payer: Self-pay | Admitting: Family Medicine

## 2016-09-07 MED ORDER — INSULIN GLARGINE 100 UNIT/ML SOLOSTAR PEN: PEN_INJECTOR | SUBCUTANEOUS | 2 refills | Status: DC

## 2016-09-21 ENCOUNTER — Other Ambulatory Visit: Payer: Self-pay

## 2016-09-21 ENCOUNTER — Telehealth: Payer: Self-pay | Admitting: Family Medicine

## 2016-09-21 ENCOUNTER — Other Ambulatory Visit: Payer: Self-pay | Admitting: Cardiology

## 2016-09-21 DIAGNOSIS — I5022 Chronic systolic (congestive) heart failure: Secondary | ICD-10-CM

## 2016-09-21 MED ORDER — FUROSEMIDE 40 MG PO TABS: 40.0000 mg | ORAL_TABLET | Freq: Two times a day (BID) | ORAL | 1 refills | Status: DC

## 2016-09-21 NOTE — Telephone Encounter (Signed)
Prescription refill sent to pharmacy after confirming it is the 40mg  dose.

## 2016-09-21 NOTE — Telephone Encounter (Signed)
Pt request refill furosemide (LASIX) 20 MG tablet  Pt is out of this med.  [place], [place]

## 2016-10-02 DIAGNOSIS — M109 Gout, unspecified: Secondary | ICD-10-CM | POA: Diagnosis not present

## 2016-10-02 DIAGNOSIS — Z888 Allergy status to other drugs, medicaments and biological substances status: Secondary | ICD-10-CM | POA: Diagnosis not present

## 2016-10-02 DIAGNOSIS — K219 Gastro-esophageal reflux disease without esophagitis: Secondary | ICD-10-CM | POA: Diagnosis not present

## 2016-10-02 DIAGNOSIS — N184 Chronic kidney disease, stage 4 (severe): Secondary | ICD-10-CM | POA: Diagnosis not present

## 2016-10-02 DIAGNOSIS — I5022 Chronic systolic (congestive) heart failure: Secondary | ICD-10-CM | POA: Diagnosis not present

## 2016-10-02 DIAGNOSIS — I255 Ischemic cardiomyopathy: Secondary | ICD-10-CM | POA: Diagnosis not present

## 2016-10-02 DIAGNOSIS — G4733 Obstructive sleep apnea (adult) (pediatric): Secondary | ICD-10-CM | POA: Diagnosis not present

## 2016-10-02 DIAGNOSIS — Z7982 Long term (current) use of aspirin: Secondary | ICD-10-CM | POA: Diagnosis not present

## 2016-10-02 DIAGNOSIS — E1165 Type 2 diabetes mellitus with hyperglycemia: Secondary | ICD-10-CM | POA: Diagnosis not present

## 2016-10-02 DIAGNOSIS — E1122 Type 2 diabetes mellitus with diabetic chronic kidney disease: Secondary | ICD-10-CM | POA: Diagnosis not present

## 2016-10-02 DIAGNOSIS — I252 Old myocardial infarction: Secondary | ICD-10-CM | POA: Diagnosis not present

## 2016-10-02 DIAGNOSIS — G510 Bell's palsy: Secondary | ICD-10-CM | POA: Diagnosis not present

## 2016-10-02 DIAGNOSIS — Z87891 Personal history of nicotine dependence: Secondary | ICD-10-CM | POA: Diagnosis not present

## 2016-10-02 DIAGNOSIS — J449 Chronic obstructive pulmonary disease, unspecified: Secondary | ICD-10-CM | POA: Diagnosis not present

## 2016-10-02 DIAGNOSIS — F329 Major depressive disorder, single episode, unspecified: Secondary | ICD-10-CM | POA: Diagnosis not present

## 2016-10-02 DIAGNOSIS — Z794 Long term (current) use of insulin: Secondary | ICD-10-CM | POA: Diagnosis not present

## 2016-10-02 DIAGNOSIS — H169 Unspecified keratitis: Secondary | ICD-10-CM | POA: Diagnosis not present

## 2016-10-02 DIAGNOSIS — I442 Atrioventricular block, complete: Secondary | ICD-10-CM | POA: Diagnosis not present

## 2016-10-02 DIAGNOSIS — H02105 Unspecified ectropion of left lower eyelid: Secondary | ICD-10-CM | POA: Diagnosis not present

## 2016-10-02 DIAGNOSIS — Z7901 Long term (current) use of anticoagulants: Secondary | ICD-10-CM | POA: Diagnosis not present

## 2016-10-02 DIAGNOSIS — H02235 Paralytic lagophthalmos left lower eyelid: Secondary | ICD-10-CM | POA: Diagnosis not present

## 2016-10-02 DIAGNOSIS — E1169 Type 2 diabetes mellitus with other specified complication: Secondary | ICD-10-CM | POA: Diagnosis not present

## 2016-10-02 DIAGNOSIS — Z6835 Body mass index (BMI) 35.0-35.9, adult: Secondary | ICD-10-CM | POA: Diagnosis not present

## 2016-10-02 DIAGNOSIS — E669 Obesity, unspecified: Secondary | ICD-10-CM | POA: Diagnosis not present

## 2016-10-16 DIAGNOSIS — H02101 Unspecified ectropion of right upper eyelid: Secondary | ICD-10-CM | POA: Diagnosis not present

## 2016-10-16 DIAGNOSIS — G510 Bell's palsy: Secondary | ICD-10-CM | POA: Diagnosis not present

## 2016-10-16 DIAGNOSIS — H02125 Mechanical ectropion of left lower eyelid: Secondary | ICD-10-CM | POA: Diagnosis not present

## 2016-10-16 DIAGNOSIS — H02234 Paralytic lagophthalmos left upper eyelid: Secondary | ICD-10-CM | POA: Diagnosis not present

## 2016-10-22 DIAGNOSIS — Z9842 Cataract extraction status, left eye: Secondary | ICD-10-CM | POA: Diagnosis not present

## 2016-10-22 DIAGNOSIS — J449 Chronic obstructive pulmonary disease, unspecified: Secondary | ICD-10-CM | POA: Diagnosis not present

## 2016-10-22 DIAGNOSIS — Z7982 Long term (current) use of aspirin: Secondary | ICD-10-CM | POA: Diagnosis not present

## 2016-10-22 DIAGNOSIS — I110 Hypertensive heart disease with heart failure: Secondary | ICD-10-CM | POA: Diagnosis not present

## 2016-10-22 DIAGNOSIS — Z9889 Other specified postprocedural states: Secondary | ICD-10-CM | POA: Diagnosis not present

## 2016-10-22 DIAGNOSIS — I509 Heart failure, unspecified: Secondary | ICD-10-CM | POA: Diagnosis not present

## 2016-10-22 DIAGNOSIS — Z87891 Personal history of nicotine dependence: Secondary | ICD-10-CM | POA: Diagnosis not present

## 2016-10-22 DIAGNOSIS — Z794 Long term (current) use of insulin: Secondary | ICD-10-CM | POA: Diagnosis not present

## 2016-10-22 DIAGNOSIS — I2510 Atherosclerotic heart disease of native coronary artery without angina pectoris: Secondary | ICD-10-CM | POA: Diagnosis not present

## 2016-10-22 DIAGNOSIS — Z48810 Encounter for surgical aftercare following surgery on the sense organs: Secondary | ICD-10-CM | POA: Diagnosis not present

## 2016-10-22 DIAGNOSIS — E119 Type 2 diabetes mellitus without complications: Secondary | ICD-10-CM | POA: Diagnosis not present

## 2016-10-22 DIAGNOSIS — Z888 Allergy status to other drugs, medicaments and biological substances status: Secondary | ICD-10-CM | POA: Diagnosis not present

## 2016-10-22 DIAGNOSIS — Z4802 Encounter for removal of sutures: Secondary | ICD-10-CM | POA: Diagnosis not present

## 2016-10-22 DIAGNOSIS — Z950 Presence of cardiac pacemaker: Secondary | ICD-10-CM | POA: Diagnosis not present

## 2016-10-22 DIAGNOSIS — Z79899 Other long term (current) drug therapy: Secondary | ICD-10-CM | POA: Diagnosis not present

## 2016-11-04 ENCOUNTER — Other Ambulatory Visit: Payer: Self-pay | Admitting: Cardiology

## 2016-11-19 IMAGING — CT CT CERVICAL SPINE W/O CM
3 of 4 series · 12 of 33 positions shown, 14 images · non-contrast
Comparison: None.

CLINICAL DATA: Bilateral upper extremity pain and radicular type
symptoms. No known trauma

EXAM:
CT CERVICAL SPINE WITHOUT CONTRAST
TECHNIQUE: Multidetector CT imaging of the cervical spine was performed without
intravenous contrast. Multiplanar CT image reconstructions were also
generated.

[Series 4: c_spine 2.0 st · axial · 0.32mm/px · z∈[-238,-102]mm · 4 of 102 slices shown, 5 images]
[im 17/102  soft-tissue]
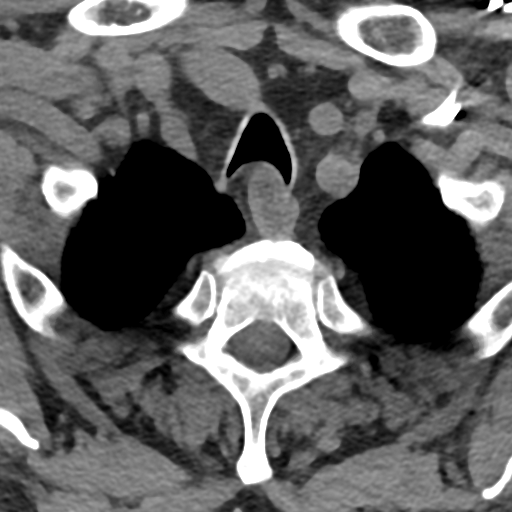
[im 17/102  bone]
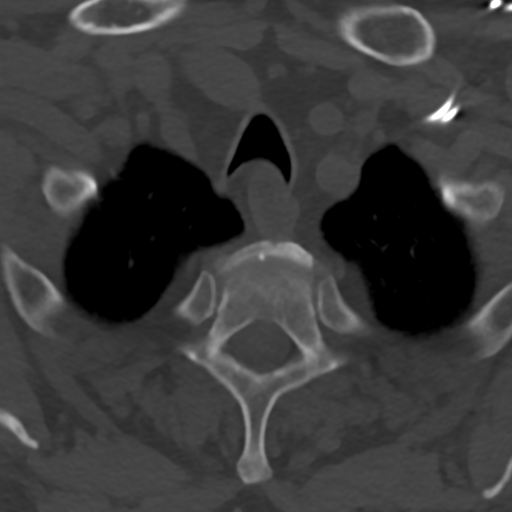
[im 34/102  bone]
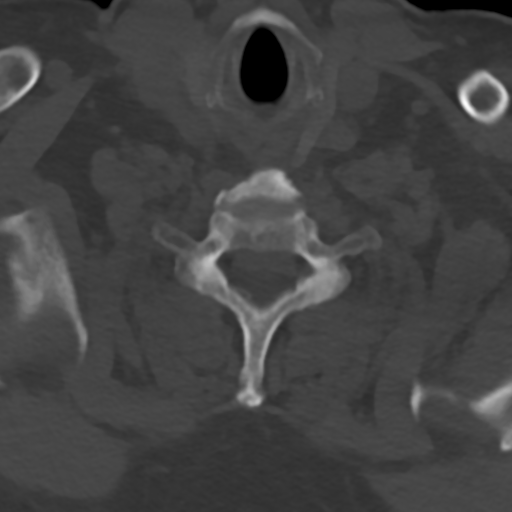
[im 68/102  bone]
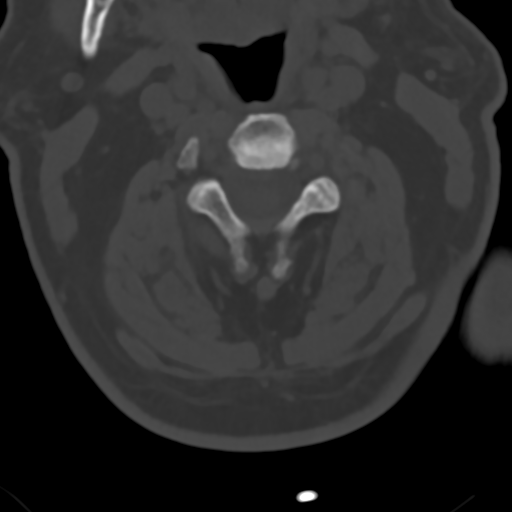
[im 85/102  bone]
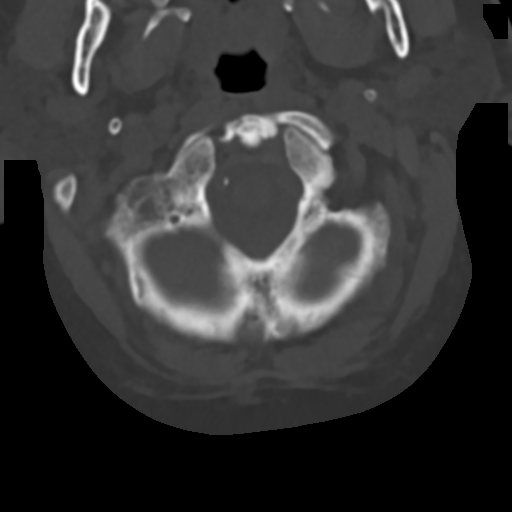

[Series 6: c_spine 2.0 sag bone · sagittal · 0.30mm/px · 5 of 57 slices shown, 6 images]
[im 19/57  bone]
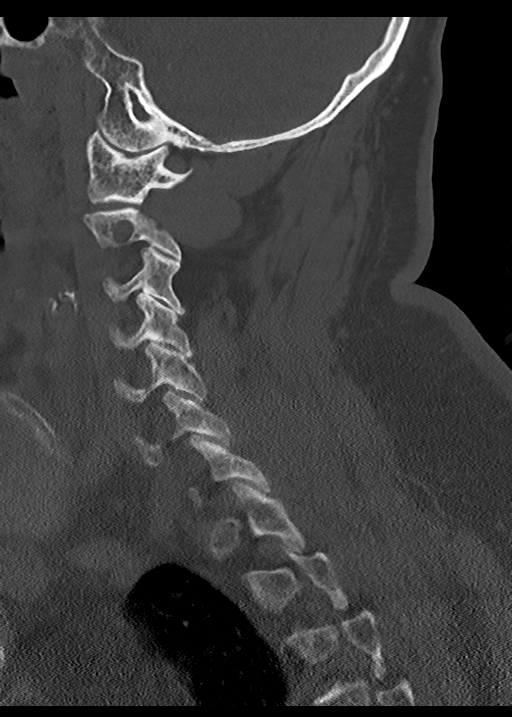
[im 24/57  bone]
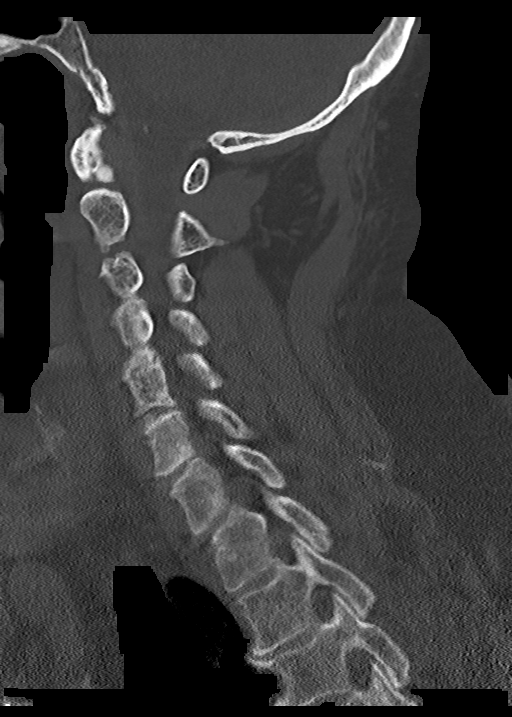
[im 29/57  soft-tissue]
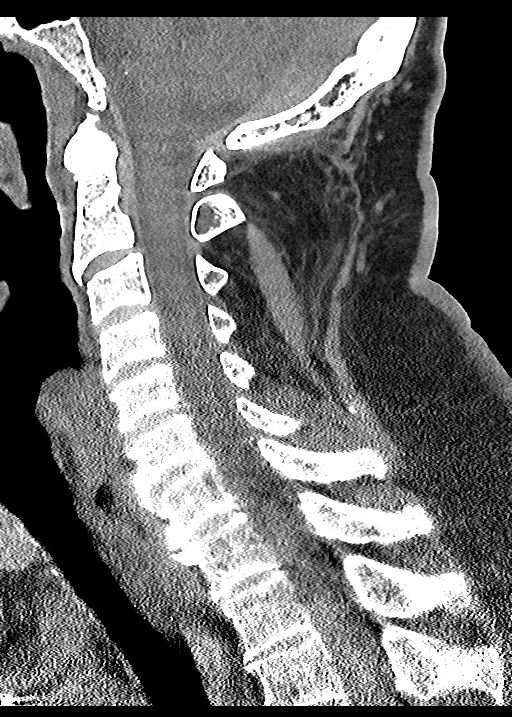
[im 29/57  bone]
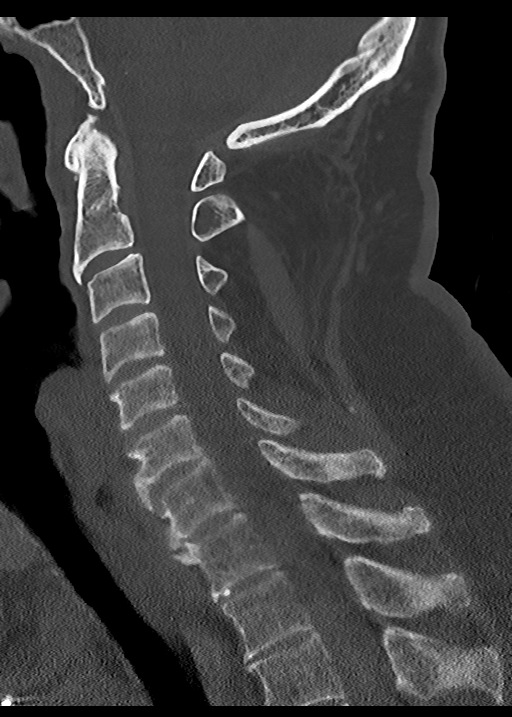
[im 33/57  bone]
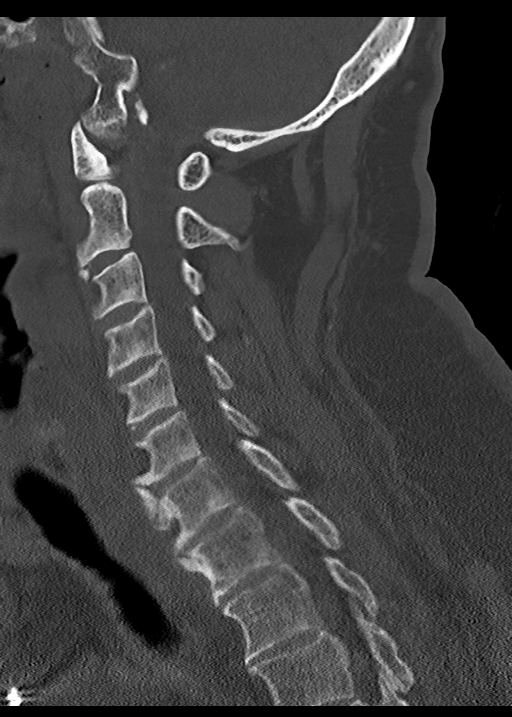
[im 38/57  bone]
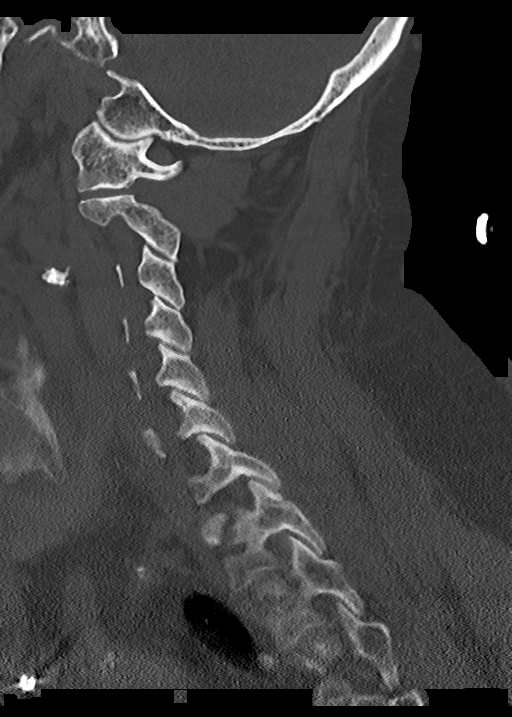

[Series 7: c_spine 2.0 cor bone · coronal · 0.30mm/px · 3 of 53 slices shown]
[im 11/53  bone]
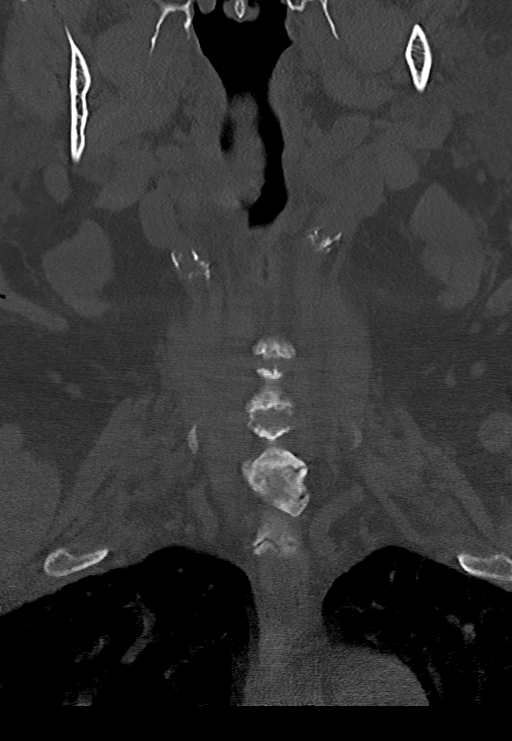
[im 21/53  bone]
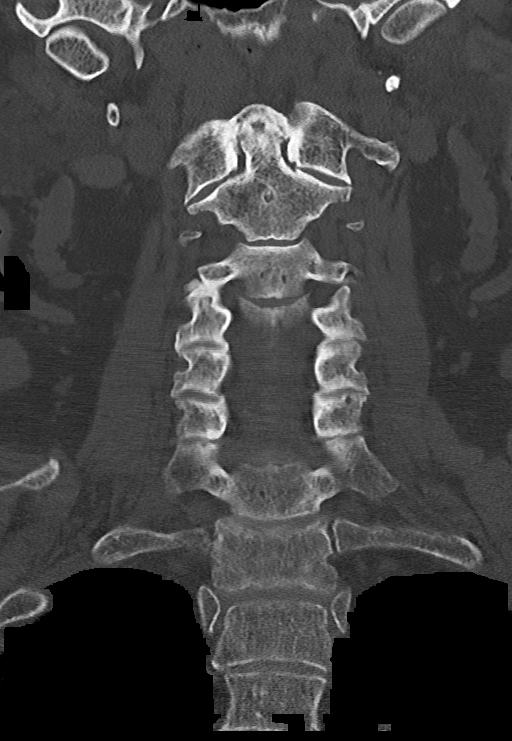
[im 32/53  bone]
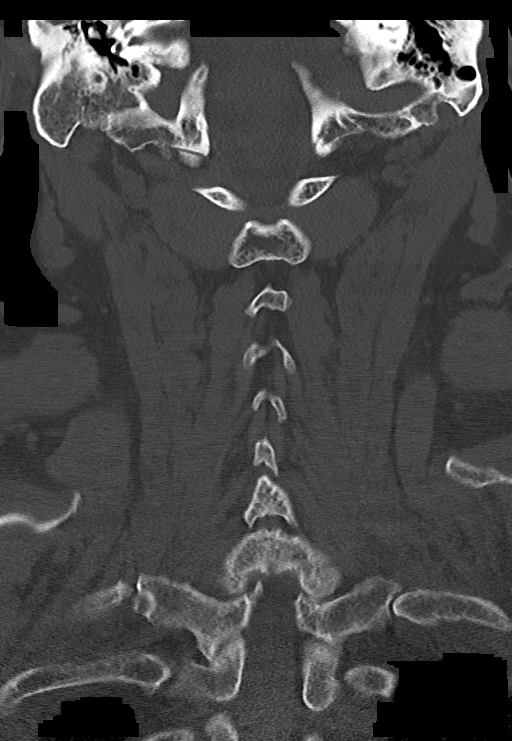

[12 of 33 positions shown; findings below may reference images not displayed]

FINDINGS: There is no fracture or spondylolisthesis. Prevertebral soft tissues
and predental space regions are normal. There is calcification in
the anterior ligament at C2-3, C5-6, C6-7, and C7-T1. There is mild
disc space narrowing C4-5, C6-7, and C7-T1.

At C2-3, there is slight facet hypertrophy bilaterally. No nerve
root edema or effacement. No disc extrusion or stenosis.

At C3-4, there is mild facet hypertrophy bilaterally. No nerve root
edema or effacement. No disc extrusion or stenosis. There is slight
central disc bulging.

At C4-5, there is mild facet osteoarthritic change bilaterally.
There is slight exit foraminal narrowing due to bony hypertrophy on
the right at this level. No nerve root edema or effacement. No disc
extrusion or stenosis.

At C5-6, there is moderate facet hypertrophy bilaterally. No nerve
root edema or effacement. No disc extrusion or stenosis.

At C6-7, there is mild facet hypertrophy bilaterally. There is mild
central disc bulging. No nerve root edema or effacement. No disc
extrusion or stenosis.

At C7-T1, there is mild facet hypertrophy bilaterally. No nerve root
edema or effacement. No disc extrusion or stenosis.

There are foci of calcification in both carotid arteries.
IMPRESSION: Multilevel osteoarthritic change. No nerve root edema or effacement.
No disc extrusion or stenosis. No fracture or spondylolisthesis.
Foci of calcification in each carotid artery noted.

## 2016-12-01 DIAGNOSIS — H16212 Exposure keratoconjunctivitis, left eye: Secondary | ICD-10-CM | POA: Diagnosis not present

## 2016-12-01 DIAGNOSIS — Z961 Presence of intraocular lens: Secondary | ICD-10-CM | POA: Diagnosis not present

## 2016-12-01 DIAGNOSIS — H35372 Puckering of macula, left eye: Secondary | ICD-10-CM | POA: Diagnosis not present

## 2016-12-01 DIAGNOSIS — H2511 Age-related nuclear cataract, right eye: Secondary | ICD-10-CM | POA: Diagnosis not present

## 2016-12-01 DIAGNOSIS — H02104 Unspecified ectropion of left upper eyelid: Secondary | ICD-10-CM | POA: Diagnosis not present

## 2016-12-01 DIAGNOSIS — H04123 Dry eye syndrome of bilateral lacrimal glands: Secondary | ICD-10-CM | POA: Diagnosis not present

## 2016-12-06 NOTE — Progress Notes (Signed)
Electrophysiology Office Note Date: 12/08/2016  ID:  Rodney, Farley 1931/10/31, MRN [id number]  PCP: [name], MD Primary Cardiologist: [name] Electrophysiologist: Caryl Comes  CC: Routine ICD follow-up  Rodney Farley is a 83 y.o. male seen today for Dr Caryl Comes.  He presents today for routine electrophysiology followup.  Since last being seen in our clinic, the patient reports doing very well. He denies chest pain, palpitations, dyspnea, PND, orthopnea, nausea, vomiting, dizziness, syncope, edema, weight gain, or early satiety.  He has not had ICD shocks.   Device History: MDT dual chamber PPM implanted 2010; upgrade to CRTD implanted 2017 for congestive heart failure History of appropriate therapy: No History of AAD therapy: No   Past Medical History:  Diagnosis Date  . Anginal pain ([place])   . Anxiety   . Arm pain 05/08/2015   LEFT ARM  . Atrioventricular block, complete ([place])    a. 2010 s/p pacemaker.  . Bell's palsy    left side. after shingles episode  . Chronic systolic CHF (congestive heart failure) ([place])    a. LVEF 35-40% by echo 09/2014.  [name] CKD (chronic kidney disease), stage IV ([place])   . COPD (chronic obstructive pulmonary disease) (HCC)    Severe  . Coronary artery disease    a. s/p MI in 1994/1995 while in [place] s/p questionable PCI. 03/2015: progression of disease, for staged PCI.  [name] Depression   . Diabetic peripheral neuropathy ([place])   . DOE (dyspnea on exertion) 03/28/2015  . GERD (gastroesophageal reflux disease)   . Hypercholesterolemia   . Hypertension   . MI (myocardial infarction) ([place]) 1994; 1995  . Neuropathy    IN LOWER EXTREMITIES  . Obesity   . Pacemaker    medtronic>>> MDT ICD 09/23/15  . Sleep apnea    "sleeps w/humidifyer when he panics and gets short of breath" (04/08/2015)  . TIA (transient ischemic attack) X 3  . Type II diabetes mellitus ([place])    Past Surgical History:  Procedure Laterality Date  . CARDIAC  CATHETERIZATION N/A 03/29/2015   Procedure: Right/Left Heart Cath and Coronary Angiography;  Surgeon: Mishael M [name], MD;  Location: [facility];  Service: Cardiovascular;  Laterality: N/A;  . [place]   "after my MI; put me on heart RX after cath"  . CARDIAC CATHETERIZATION N/A 04/09/2015   Procedure: Coronary Stent Intervention;  Surgeon: Caileb M [name], MD;  Location: [facility];  Service: Cardiovascular;  Laterality: N/A;  . CHOLECYSTECTOMY OPEN  ~ 1977  . EP IMPLANTABLE DEVICE N/A 09/23/2015   MDT CRT-D, Dr. Caryl Comes  . HIATAL HERNIA REPAIR  1977  . INSERT / REPLACE / REMOVE PACEMAKER  07/2008   Complete heart block status post DDD with good function  . TONSILLECTOMY      Current Outpatient Prescriptions  Medication Sig Dispense Refill  . aspirin EC 81 MG tablet Take 81 mg by mouth daily.    [name] atorvastatin (LIPITOR) 40 MG tablet Take 1 tablet (40 mg total) by mouth daily. 90 tablet 3  . carvedilol (COREG) 12.5 MG tablet Take 1 tablet (12.5 mg total) by mouth 2 (two) times daily with a meal. 60 tablet 11  . clopidogrel (PLAVIX) 75 MG tablet TAKE ONE TABLET BY MOUTH ONCE DAILY WITH BREAKFAST 30 tablet 2  . furosemide (LASIX) 40 MG tablet Take 1 tablet (40 mg total) by mouth 2 (two) times daily. 180 tablet 1  . hydrALAZINE (APRESOLINE) 25 MG  tablet TAKE ONE & ONE-HALF TABLETS BY MOUTH TWICE DAILY 90 tablet 1  . Insulin Glargine (LANTUS SOLOSTAR) 100 UNIT/ML Solostar Pen INJECT 60 UNITS UNDER THE SKIN ONCE DAILY 45 mL 2  . isosorbide mononitrate (IMDUR) 30 MG 24 hr tablet TAKE ONE TABLET BY MOUTH ONCE DAILY 30 tablet 1  . NITROSTAT 0.4 MG SL tablet PLACE 1 TABLET (0.4 MG TOTAL) UNDER THE TONGUE EVERY 5 (FIVE) MINUTES X 3 DOSES AS NEEDED FOR CHEST PAIN. 25 tablet 3  . OVER THE COUNTER MEDICATION Place 1 drop into both eyes 3 (three) times daily. OTC Bausch-Lomb eye bath    . pantoprazole (PROTONIX) 40 MG tablet TAKE ONE TABLET BY MOUTH ONCE DAILY 30 tablet 1    . sertraline (ZOLOFT) 100 MG tablet TAKE ONE AND ONE-HALF TABLETS BY MOUTH ONCE DAILY 135 tablet 0  . tamsulosin (FLOMAX) 0.4 MG CAPS capsule TAKE 1 CAPSULE BY MOUTH DAILY AFTER SUPPER 30 capsule 11  . UNIFINE PENTIPS 31G X 5 MM MISC USE AS DIRECTED 100 each 0  . vitamin B-12 (CYANOCOBALAMIN) 1000 MCG tablet Take 1,000 mcg by mouth daily.     No current facility-administered medications for this visit.     Allergies:   Bee venom; Lyrica [pregabalin]; Prednisone; and Zocor [simvastatin]   Social History: Social History   Social History  . Marital status: Married    Spouse name: N/A  . Number of children: 1  . Years of education: college   Occupational History  . Retired    Social History Main Topics  . Smoking status: Former Smoker    Packs/day: 1.50    Years: 54.00    Types: Cigarettes    Quit date: 07/18/2007  . Smokeless tobacco: Never Used  . Alcohol use No     Comment: 04/08/2015 "probably 3 drinks/month"  . Drug use: No  . Sexual activity: No   Other Topics Concern  . Not on file   Social History Narrative   Married 1994 (together since 1989) 1 son, 1 stepson. 3 grandkids, 6 great grandkids      Retired from [employer]. Had 2 years of collge.       Faith: Mormon    Family History: Family History  Problem Relation Age of Onset  . Stroke Mother   . Leukemia Father   . Stroke Sister   . Heart attack Brother     Review of Systems: All other systems reviewed and are otherwise negative except as noted above.   Physical Exam: VS:  BP 134/68   Pulse 76   Ht 5\' 7"  (1.702 m)   Wt 238 lb (108 kg)   SpO2 97%   BMI 37.28 kg/m  , BMI Body mass index is 37.28 kg/m.  GEN- The patient is well appearing, alert and oriented x 3 today.   HEENT: normocephalic, atraumatic; sclera clear, conjunctiva pink; hearing intact; oropharynx clear; neck supple, no JVP Lymph- no cervical lymphadenopathy Lungs- Clear to ausculation bilaterally, normal work of breathing.   No wheezes, rales, rhonchi Heart- Regular rate and rhythm, no murmurs, rubs or gallops, PMI not laterally displaced GI- soft, non-tender, non-distended, bowel sounds present, no hepatosplenomegaly Extremities- no clubbing, cyanosis, or edema; DP/PT/radial pulses 2+ bilaterally MS- no significant deformity or atrophy Skin- warm and dry, no rash or lesion; ICD pocket well healed Psych- euthymic mood, full affect Neuro- strength and sensation are intact  ICD interrogation- reviewed in detail today,  See PACEART report  EKG:  EKG is not  ordered today.  Recent Labs: 01/10/2016: ALT 18; BUN 46; Creatinine, Ser 2.74; Hemoglobin 12.7; Platelets 205; Potassium 4.1; Sodium 138   Wt Readings from Last 3 Encounters:  12/08/16 238 lb (108 kg)  06/24/16 220 lb (99.8 kg)  06/10/16 220 lb (99.8 kg)     Other studies Reviewed: Additional studies/ records that were reviewed today include: Dr Caryl Comes and Dr Doug Sou office notes  Assessment and Plan:  1.  Chronic systolic dysfunction euvolemic today Stable on an appropriate medical regimen Normal ICD function See Pace Art report No changes today EF normalized post CRT implant  2.  CAD No recent ischemic symptoms Continue current therapy  3.  HTN Stable No change required today  4.  Complete heart block Normal device function Pt is pacer dependent today    Current medicines are reviewed at length with the patient today.   The patient does not have concerns regarding his medicines.  The following changes were made today:  none  Labs/ tests ordered today include: none No orders of the defined types were placed in this encounter.    Disposition:   Follow up with [facility], Dr [name] as scheduled, Dr Caryl Comes 1 year     Signed, Chanetta Marshall, NP 12/08/2016 12:02 PM  [facility] [address] [place] [address] [phone] (office) [phone] (fax)

## 2016-12-08 ENCOUNTER — Ambulatory Visit (INDEPENDENT_AMBULATORY_CARE_PROVIDER_SITE_OTHER): Payer: PPO | Admitting: Nurse Practitioner

## 2016-12-08 ENCOUNTER — Encounter: Payer: Self-pay | Admitting: Nurse Practitioner

## 2016-12-08 VITALS — BP 134/68 | HR 76 | Ht 67.0 in | Wt 238.0 lb

## 2016-12-08 DIAGNOSIS — I442 Atrioventricular block, complete: Secondary | ICD-10-CM

## 2016-12-08 DIAGNOSIS — I5022 Chronic systolic (congestive) heart failure: Secondary | ICD-10-CM | POA: Diagnosis not present

## 2016-12-08 DIAGNOSIS — I2510 Atherosclerotic heart disease of native coronary artery without angina pectoris: Secondary | ICD-10-CM | POA: Diagnosis not present

## 2016-12-08 DIAGNOSIS — I10 Essential (primary) hypertension: Secondary | ICD-10-CM | POA: Diagnosis not present

## 2016-12-08 LAB — CUP PACEART INCLINIC DEVICE CHECK
Implantable Lead Implant Date: 20100112
Implantable Lead Location: 753858
Implantable Lead Location: 753859
MDC IDC LEAD IMPLANT DT: 20170313
MDC IDC LEAD IMPLANT DT: 20170313
MDC IDC LEAD LOCATION: 753860
MDC IDC PG IMPLANT DT: 20170313
MDC IDC SESS DTM: 20180529120446

## 2016-12-08 NOTE — Patient Instructions (Signed)
Medication Instructions:    Your physician recommends that you continue on your current medications as directed. Please refer to the Current Medication list given to you today.  - If you need a refill on your cardiac medications before your next appointment, please call your pharmacy.   Labwork:  None ordered  Testing/Procedures:  None ordered  Follow-Up: Remote monitoring is used to monitor your Pacemaker of ICD from home. This monitoring reduces the number of office visits required to check your device to one time per year. It allows [place] to keep an eye on the functioning of your device to ensure it is working properly. You are scheduled for a device check from home on 03/09/2017. You may send your transmission at any time that day. If you have a wireless device, the transmission will be sent automatically. After your physician reviews your transmission, you will receive a postcard with your next transmission date.   Your physician wants you to follow-up in: 1 year with Dr. Caryl Comes.  You will receive a reminder letter in the mail two months in advance. If you don't receive a letter, please call our office to schedule the follow-up appointment.  Thank you for choosing [facility]!!

## 2016-12-09 ENCOUNTER — Encounter: Payer: PPO | Admitting: Nurse Practitioner

## 2016-12-10 ENCOUNTER — Other Ambulatory Visit: Payer: Self-pay | Admitting: Family Medicine

## 2016-12-10 ENCOUNTER — Other Ambulatory Visit: Payer: Self-pay | Admitting: Cardiology

## 2016-12-10 DIAGNOSIS — I5022 Chronic systolic (congestive) heart failure: Secondary | ICD-10-CM

## 2016-12-30 DIAGNOSIS — H2511 Age-related nuclear cataract, right eye: Secondary | ICD-10-CM | POA: Diagnosis not present

## 2016-12-30 DIAGNOSIS — H25811 Combined forms of age-related cataract, right eye: Secondary | ICD-10-CM | POA: Diagnosis not present

## 2017-01-08 ENCOUNTER — Other Ambulatory Visit: Payer: Self-pay | Admitting: Cardiology

## 2017-01-08 ENCOUNTER — Other Ambulatory Visit: Payer: Self-pay | Admitting: Family Medicine

## 2017-01-15 ENCOUNTER — Encounter: Payer: Self-pay | Admitting: Internal Medicine

## 2017-01-24 ENCOUNTER — Encounter: Payer: Self-pay | Admitting: Sports Medicine

## 2017-01-27 ENCOUNTER — Ambulatory Visit: Payer: Self-pay

## 2017-01-27 ENCOUNTER — Encounter: Payer: Self-pay | Admitting: Sports Medicine

## 2017-01-27 ENCOUNTER — Ambulatory Visit (INDEPENDENT_AMBULATORY_CARE_PROVIDER_SITE_OTHER): Payer: PPO | Admitting: Sports Medicine

## 2017-01-27 VITALS — BP 144/82 | HR 79 | Ht 67.0 in | Wt 241.6 lb

## 2017-01-27 DIAGNOSIS — M65331 Trigger finger, right middle finger: Secondary | ICD-10-CM

## 2017-01-27 DIAGNOSIS — IMO0002 Reserved for concepts with insufficient information to code with codable children: Secondary | ICD-10-CM

## 2017-01-27 DIAGNOSIS — I25119 Atherosclerotic heart disease of native coronary artery with unspecified angina pectoris: Secondary | ICD-10-CM

## 2017-01-27 DIAGNOSIS — E1165 Type 2 diabetes mellitus with hyperglycemia: Secondary | ICD-10-CM | POA: Diagnosis not present

## 2017-01-27 DIAGNOSIS — E1122 Type 2 diabetes mellitus with diabetic chronic kidney disease: Secondary | ICD-10-CM

## 2017-01-27 DIAGNOSIS — N183 Chronic kidney disease, stage 3 (moderate): Secondary | ICD-10-CM

## 2017-01-27 DIAGNOSIS — M65332 Trigger finger, left middle finger: Secondary | ICD-10-CM

## 2017-01-27 DIAGNOSIS — I10 Essential (primary) hypertension: Secondary | ICD-10-CM | POA: Diagnosis not present

## 2017-01-27 DIAGNOSIS — Z794 Long term (current) use of insulin: Secondary | ICD-10-CM | POA: Diagnosis not present

## 2017-01-27 NOTE — Progress Notes (Signed)
OFFICE VISIT NOTE Rodney Farley, Rodney at [facility]  Rodney Farley - 83 y.o. male MRN [id number]  Date of birth: [birth year]  Visit Date: 01/27/2017  PCP: Rodney Olp, MD   Referred by: Rodney Olp, MD  Rodney Farley, CMA acting as scribe for Dr. Paulla Farley.  SUBJECTIVE:   Chief Complaint  Patient presents with  . Follow-up    trigger finger   HPI: As below and per problem based documentation when appropriate.  Pt presents today in follow-up of trigger finger, middle fingers of right and left hands. He was a prior patient of Dr. Nicolasa Farley at [facility]. He did receive steroid injection 06/10/2016.   Pt reports trigger finger of the middle fingers, right and left hands. Sx started around the 3rd day after his last injection. He was buddy taping the finger but then he developed some irritation so he now only wraps the middle finger. The right finger doesn't have much pain but there is a lot of pain in the left middle finger. Pain is worse when bending the finger back, rated 10/10. When just resting the finger it is about 4/10.   Pt denies fever, chills, night sweats.     Review of Systems  Constitutional: Negative for chills and fever.  Respiratory: Negative for shortness of breath and wheezing.   Cardiovascular: Positive for leg swelling. Negative for chest pain and palpitations.  Musculoskeletal: Positive for joint pain. Negative for falls.  Neurological: Positive for tingling (neuropathy). Negative for dizziness and headaches.  Endo/Heme/Allergies: Does not bruise/bleed easily.    Otherwise per HPI.  HISTORY & PERTINENT PRIOR DATA:  No specialty comments available. He reports that he quit smoking about 9 years ago. His smoking use included Cigarettes. He has a 81.00 pack-year smoking history. He has never used smokeless tobacco.   Recent Labs  03/26/16 1423  HGBA1C 6.8   Medications &  Allergies reviewed per EMR Patient Active Problem List   Diagnosis Date Noted  . Current use of long term anticoagulation - Plavix 06/10/2016  . Trigger finger 06/10/2016  . BPH associated with nocturia 04/27/2016  . Diabetic peripheral neuropathy ([place])   . COPD (chronic obstructive pulmonary disease) ([place])   . Bell's palsy   . Gout 06/26/2015  . Chronic systolic CHF (congestive heart failure) ([place]) 04/08/2015  . Diverticulitis 04/08/2015  . DOE (dyspnea on exertion), due to significant CAD 03/28/2015  . CKD (chronic kidney disease) stage 4, GFR 15-29 ml/min (HCC) 03/04/2015  . Coronary artery disease involving native coronary artery of native heart with angina pectoris ([place]) 09/13/2014  . GERD (gastroesophageal reflux disease) 08/07/2012  . Depression 08/05/2012  . Sleep apnea 08/05/2012  . Uncontrolled type 2 diabetes mellitus with stage 3 chronic kidney disease, with long-term current use of insulin ([place]) 08/05/2012  . HTN (hypertension) 08/05/2012  . Ischemic cardiomyopathy 11/03/2011  . Atrioventricular block, complete ([place])   . Obesity   . Pacemaker    Past Medical History:  Diagnosis Date  . Anginal pain ([place])   . Anxiety   . Arm pain 05/08/2015   LEFT ARM  . Atrioventricular block, complete ([place])    a. 2010 s/p pacemaker.  . Bell's palsy    left side. after shingles episode  . Chronic systolic CHF (congestive heart failure) ([place])    a. LVEF 35-40% by echo 09/2014.  [name] CKD (chronic kidney disease), stage IV ([place])   . COPD (chronic obstructive  pulmonary disease) (HCC)    Severe  . Coronary artery disease    a. s/p MI in 1994/1995 while in [place] s/p questionable PCI. 03/2015: progression of disease, for staged PCI.  [name] Depression   . Diabetic peripheral neuropathy ([place])   . DOE (dyspnea on exertion) 03/28/2015  . GERD (gastroesophageal reflux disease)   . Hypercholesterolemia   . Hypertension   . MI (myocardial infarction) ([place]) 1994; 1995  . Neuropathy    IN LOWER  EXTREMITIES  . Obesity   . Pacemaker    medtronic>>> MDT ICD 09/23/15  . Sleep apnea    "sleeps w/humidifyer when he panics and gets short of breath" (04/08/2015)  . TIA (transient ischemic attack) X 3  . Type II diabetes mellitus (HCC)    Family History  Problem Relation Age of Onset  . Stroke Mother   . Leukemia Father   . Stroke Sister   . Heart attack Brother    Past Surgical History:  Procedure Laterality Date  . CARDIAC CATHETERIZATION N/A 03/29/2015   Procedure: Right/Left Heart Cath and Coronary Angiography;  Surgeon: Dreyden M [name], MD;  Location: [facility];  Service: Cardiovascular;  Laterality: N/A;  . [place]   "after my MI; put me on heart RX after cath"  . CARDIAC CATHETERIZATION N/A 04/09/2015   Procedure: Coronary Stent Intervention;  Surgeon: Eli M [name], MD;  Location: [facility];  Service: Cardiovascular;  Laterality: N/A;  . CHOLECYSTECTOMY OPEN  ~ 1977  . EP IMPLANTABLE DEVICE N/A 09/23/2015   MDT CRT-D, Dr. Caryl Comes  . HIATAL HERNIA REPAIR  1977  . INSERT / REPLACE / REMOVE PACEMAKER  07/2008   Complete heart block status post DDD with good function  . TONSILLECTOMY     Social History   Occupational History  . Retired    Social History Main Topics  . Smoking status: Former Smoker    Packs/day: 1.50    Years: 54.00    Types: Cigarettes    Quit date: 07/18/2007  . Smokeless tobacco: Never Used  . Alcohol use No     Comment: 04/08/2015 "probably 3 drinks/month"  . Drug use: No  . Sexual activity: No    OBJECTIVE:  VS:  HT:5\' 7"  (170.2 cm)   WT:241 lb 9.6 oz (109.6 kg)  BMI:37.9    BP:(!) 144/82  HR:79bpm  TEMP: ( )  RESP:96 % EXAM: Findings:  WDWN, NAD, Non-toxic appearing Alert & appropriately interactive Not depressed or anxious appearing No increased work of breathing. Pupils are equal. EOM intact without nystagmus No clubbing or cyanosis of the extremities appreciated No significant  rashes/lesions/ulcerations overlying the examined area. Radial pulses 1+/4.  No significant generalized UE edema. Sensation intact to light touch in upper extremities.  Bilateral hands: Overall well aligned. No significant deformity Marked triggering of the Right middle finger that is non-painful Marked pain and inability to flex the left middle finger.  Resolves s/p injection Good capillary refill No significant bossing of joints but large hands at baseline.  Pain is present over the A1 pulley of the left 3rd finger.     No results found. ASSESSMENT & PLAN:   Problem List Items Addressed This Visit    Uncontrolled type 2 diabetes mellitus with stage 3 chronic kidney disease, with long-term current use of insulin ([place])    Needs to avoid NSAIDS Continue topical Icy Hot PRN      HTN (hypertension)   Coronary artery disease involving  native coronary artery of native heart with angina pectoris (HCC)   Trigger finger - Primary    Patient has significant triggering of bilateral middle fingers with pain related symptoms only on the left.  He has marked thickening on ultrasound.  Ultrasound-guided injection performed today and will consider one additional injection in 6 weeks if any lack of improvement following this would consider surgical intervention.  Right hand if any development of pain will consider injection.  We also gave him a finger splint today to be worn at night to prevent hyperflexion of the middle finger while sleeping.  PROCEDURE NOTE -  ULTRASOUND GUIDEDINJECTION: Left Middle Finger Flexor Tendon Sheath Injection Images were obtained and interpreted by myself, Teresa Coombs, DO  Images have been saved and stored to PACS system. Images obtained on: GE S7 Ultrasound machine  ULTRASOUND FINDINGS:  Marked thickening and dilation of the flexor tendon with engorgement and hypertrophic changes of the A1 pulley.  DESCRIPTION OF PROCEDURE:  [name] clinical condition is  marked by substantial pain and/or significant functional disability. Other conservative therapy has not provided relief, is contraindicated, or not appropriate. There is a reasonable likelihood that injection will significantly improve the patient's pain and/or functional impairment. After discussing the risks, benefits and expected outcomes of the injection and all questions were reviewed and answered, the patient wished to undergo the above named procedure. Verbal consent was obtained. The ultrasound was used to identify the target structure and adjacent neurovascular structures. The skin was then prepped in sterile fashion and the target structure was injected under direct visualization using sterile technique as below: PREP: Alcohol, Ethel Chloride APPROACH: direct inplane, single injection, 25g 1.5" needle INJECTATE: 1cc 1% lidocaine, 1cc 40mg  DepoMedrol ASPIRATE: N/A DRESSING: Band-Aid & Alumafoam Splint  Post procedural instructions including recommending icing and warning signs for infection were reviewed. This procedure was well tolerated and there were no complications.   IMPRESSION: Succesful US Guided Injection         Relevant Orders   [place] LIMITED JOINT SPACE STRUCTURES UP LEFT(NO LINKED CHARGES)      Follow-up: Return in about 6 weeks (around 03/10/2017).   CMA/ATC served as Education administrator during this visit. History, Physical, and Plan performed by medical provider. Documentation and orders reviewed and attested to.      Teresa Coombs, [facility] Physician

## 2017-01-27 NOTE — Assessment & Plan Note (Addendum)
Needs to avoid NSAIDS Continue topical Icy Hot PRN

## 2017-01-27 NOTE — Patient Instructions (Signed)

## 2017-01-27 NOTE — Assessment & Plan Note (Signed)
Patient has significant triggering of bilateral middle fingers with pain related symptoms only on the left.  He has marked thickening on ultrasound.  Ultrasound-guided injection performed today and will consider one additional injection in 6 weeks if any lack of improvement following this would consider surgical intervention.  Right hand if any development of pain will consider injection.  We also gave him a finger splint today to be worn at night to prevent hyperflexion of the middle finger while sleeping.  PROCEDURE NOTE -  ULTRASOUND GUIDEDINJECTION: Left Middle Finger Flexor Tendon Sheath Injection Images were obtained and interpreted by myself, Teresa Coombs, DO  Images have been saved and stored to PACS system. Images obtained on: GE S7 Ultrasound machine  ULTRASOUND FINDINGS:  Marked thickening and dilation of the flexor tendon with engorgement and hypertrophic changes of the A1 pulley.  DESCRIPTION OF PROCEDURE:  [name] clinical condition is marked by substantial pain and/or significant functional disability. Other conservative therapy has not provided relief, is contraindicated, or not appropriate. There is a reasonable likelihood that injection will significantly improve the patient's pain and/or functional impairment. After discussing the risks, benefits and expected outcomes of the injection and all questions were reviewed and answered, the patient wished to undergo the above named procedure. Verbal consent was obtained. The ultrasound was used to identify the target structure and adjacent neurovascular structures. The skin was then prepped in sterile fashion and the target structure was injected under direct visualization using sterile technique as below: PREP: Alcohol, Ethel Chloride APPROACH: direct inplane, single injection, 25g 1.5" needle INJECTATE: 1cc 1% lidocaine, 1cc 40mg  DepoMedrol ASPIRATE: N/A DRESSING: Band-Aid & Alumafoam Splint  Post procedural instructions  including recommending icing and warning signs for infection were reviewed. This procedure was well tolerated and there were no complications.   IMPRESSION: Succesful US Guided Injection

## 2017-01-28 ENCOUNTER — Encounter: Payer: Self-pay | Admitting: Family Medicine

## 2017-01-28 ENCOUNTER — Other Ambulatory Visit: Payer: Self-pay

## 2017-01-28 ENCOUNTER — Ambulatory Visit (INDEPENDENT_AMBULATORY_CARE_PROVIDER_SITE_OTHER): Payer: PPO | Admitting: Family Medicine

## 2017-01-28 ENCOUNTER — Telehealth: Payer: Self-pay | Admitting: Family Medicine

## 2017-01-28 VITALS — BP 130/70 | HR 77 | Temp 98.5°F | Ht 67.0 in | Wt 239.2 lb

## 2017-01-28 DIAGNOSIS — Z794 Long term (current) use of insulin: Secondary | ICD-10-CM

## 2017-01-28 DIAGNOSIS — E1122 Type 2 diabetes mellitus with diabetic chronic kidney disease: Secondary | ICD-10-CM

## 2017-01-28 DIAGNOSIS — I5022 Chronic systolic (congestive) heart failure: Secondary | ICD-10-CM

## 2017-01-28 DIAGNOSIS — N183 Chronic kidney disease, stage 3 (moderate): Secondary | ICD-10-CM | POA: Diagnosis not present

## 2017-01-28 DIAGNOSIS — R609 Edema, unspecified: Secondary | ICD-10-CM

## 2017-01-28 DIAGNOSIS — E1165 Type 2 diabetes mellitus with hyperglycemia: Secondary | ICD-10-CM

## 2017-01-28 DIAGNOSIS — N184 Chronic kidney disease, stage 4 (severe): Secondary | ICD-10-CM

## 2017-01-28 DIAGNOSIS — IMO0002 Reserved for concepts with insufficient information to code with codable children: Secondary | ICD-10-CM

## 2017-01-28 LAB — CBC
HCT: 35.5 % — ABNORMAL LOW (ref 39.0–52.0)
HEMOGLOBIN: 12 g/dL — AB (ref 13.0–17.0)
MCHC: 33.8 g/dL (ref 30.0–36.0)
MCV: 92.3 fl (ref 78.0–100.0)
PLATELETS: 204 10*3/uL (ref 150.0–400.0)
RBC: 3.85 Mil/uL — ABNORMAL LOW (ref 4.22–5.81)
RDW: 14.3 % (ref 11.5–15.5)
WBC: 9.1 10*3/uL (ref 4.0–10.5)

## 2017-01-28 LAB — COMPREHENSIVE METABOLIC PANEL
ALT: 15 U/L (ref 0–53)
AST: 13 U/L (ref 0–37)
Albumin: 3.9 g/dL (ref 3.5–5.2)
Alkaline Phosphatase: 61 U/L (ref 39–117)
BUN: 38 mg/dL — ABNORMAL HIGH (ref 6–23)
CALCIUM: 9.2 mg/dL (ref 8.4–10.5)
CO2: 30 meq/L (ref 19–32)
CREATININE: 2.76 mg/dL — AB (ref 0.40–1.50)
Chloride: 102 mEq/L (ref 96–112)
GFR: 23.5 mL/min — AB (ref >60.00)
GLUCOSE: 205 mg/dL — AB (ref 70–99)
Potassium: 5 mEq/L (ref 3.5–5.1)
Sodium: 137 mEq/L (ref 135–145)
TOTAL PROTEIN: 6 g/dL (ref 6.0–8.3)
Total Bilirubin: 0.5 mg/dL (ref 0.2–1.2)

## 2017-01-28 LAB — HEMOGLOBIN A1C: HEMOGLOBIN A1C: 8.2 % — AB (ref 4.6–6.5)

## 2017-01-28 LAB — TSH: TSH: 1.41 u[IU]/mL (ref 0.35–4.50)

## 2017-01-28 MED ORDER — INSULIN PEN NEEDLE 31G X 5 MM MISC: 0 refills | Status: DC

## 2017-01-28 MED ORDER — GLUCOSE BLOOD VI STRP: ORAL_STRIP | 4 refills | Status: DC

## 2017-01-28 NOTE — Assessment & Plan Note (Signed)
S: Patient with history of systolic heart failure due to ischemic cardiomyopathy on baseline Lasix reported to me previously as 30 mg which would be an odd dose- later clarified to be 40mg  BID. He also has chronic kidney disease IV and follows with [facility].  Over last 3 weeks, weight has gone up despite compliance with lasix. Prior no edema but has developed significant edema in this time frame.  A/P: CHF exacerbation likely- will do as AVS notes below. Also update labs to look for other causes of edema. With ckd IV not planning on adding potassium at present. Needs to watch salt in diet.

## 2017-01-28 NOTE — Patient Instructions (Signed)
Increase to 2 lasix each morning and 1 in afternoon (80mg  then 40mg ).   Follow up next Wednesday at 11 30- schedule before you go. iF weight increases despite these changes need to see [place] sooner  Please stop by lab before you go

## 2017-01-28 NOTE — Assessment & Plan Note (Signed)
S: well controlled. On last check last year but did not follow upin 3 months as instructed. Has some pain from neuropathy Lab Results  Component Value Date   HGBA1C 6.8 03/26/2016   HGBA1C 6.9 06/26/2015   HGBA1C 7.4 (H) 05/07/2015   A/P: update a1c today, asks about meds for neuropathy but discussed would not want new start before labs and with changes in lasix planned.

## 2017-01-28 NOTE — Progress Notes (Signed)
Subjective:  Rodney Farley is a 83 y.o. year old very pleasant male patient who presents for/with See problem oriented charting ROS- worsening shortness of breaht, worsening edema, increased weight   Past Medical History-  Patient Active Problem List   Diagnosis Date Noted  . COPD (chronic obstructive pulmonary disease) ([place])     Priority: High  . Bell's palsy     Priority: High  . Chronic systolic CHF (congestive heart failure) ([place]) 04/08/2015    Priority: High  . DOE (dyspnea on exertion), due to significant CAD 03/28/2015    Priority: High  . CKD (chronic kidney disease) stage 4, GFR 15-29 ml/min (HCC) 03/04/2015    Priority: High  . Coronary artery disease involving native coronary artery of native heart with angina pectoris ([place]) 09/13/2014    Priority: High  . Uncontrolled type 2 diabetes mellitus with stage 3 chronic kidney disease, with long-term current use of insulin ([place]) 08/05/2012    Priority: High  . Ischemic cardiomyopathy 11/03/2011    Priority: High  . Atrioventricular block, complete (HCC)     Priority: High  . Pacemaker     Priority: High  . BPH associated with nocturia 04/27/2016    Priority: Medium  . Diabetic peripheral neuropathy (HCC)     Priority: Medium  . Gout 06/26/2015    Priority: Medium  . Depression 08/05/2012    Priority: Medium  . Sleep apnea 08/05/2012    Priority: Medium  . HTN (hypertension) 08/05/2012    Priority: Medium  . Diverticulitis 04/08/2015    Priority: Low  . GERD (gastroesophageal reflux disease) 08/07/2012    Priority: Low  . Obesity     Priority: Low  . Current use of long term anticoagulation - Plavix 06/10/2016  . Trigger finger 06/10/2016    Medications- reviewed and updated Current Outpatient Prescriptions  Medication Sig Dispense Refill  . aspirin EC 81 MG tablet Take 81 mg by mouth daily.    [name] atorvastatin (LIPITOR) 40 MG tablet Take 1 tablet (40 mg total) by mouth daily. 90 tablet 3  . carvedilol  (COREG) 12.5 MG tablet Take 1 tablet (12.5 mg total) by mouth 2 (two) times daily with a meal. 60 tablet 11  . clopidogrel (PLAVIX) 75 MG tablet TAKE 1 TABLET BY MOUTH ONCE DAILY WITH BREAKFAST 30 tablet 2  . clopidogrel (PLAVIX) 75 MG tablet TAKE 1 TABLET BY MOUTH ONCE DAILY WITH BREAKFAST 90 tablet 0  . furosemide (LASIX) 40 MG tablet Take 1 tablet (40 mg total) by mouth 2 (two) times daily. 180 tablet 1  . hydrALAZINE (APRESOLINE) 25 MG tablet TAKE 1 & 1/2 (ONE & ONE-HALF) TABLETS BY MOUTH TWICE DAILY 90 tablet 1  . Insulin Glargine (LANTUS SOLOSTAR) 100 UNIT/ML Solostar Pen INJECT 60 UNITS UNDER THE SKIN ONCE DAILY 45 mL 2  . isosorbide mononitrate (IMDUR) 30 MG 24 hr tablet TAKE ONE TABLET BY MOUTH ONCE DAILY 30 tablet 0  . NITROSTAT 0.4 MG SL tablet PLACE 1 TABLET (0.4 MG TOTAL) UNDER THE TONGUE EVERY 5 (FIVE) MINUTES X 3 DOSES AS NEEDED FOR CHEST PAIN. 25 tablet 3  . OVER THE COUNTER MEDICATION Place 1 drop into both eyes 3 (three) times daily. OTC Bausch-Lomb eye bath    . pantoprazole (PROTONIX) 40 MG tablet Take 1 tablet (40 mg total) by mouth daily. Need appointment for further refills 30 tablet 0  . sertraline (ZOLOFT) 100 MG tablet TAKE ONE AND ONE-HALF TABLETS BY MOUTH ONCE DAILY 135 tablet 0  .  tamsulosin (FLOMAX) 0.4 MG CAPS capsule TAKE 1 CAPSULE BY MOUTH DAILY AFTER SUPPER 30 capsule 11  . vitamin B-12 (CYANOCOBALAMIN) 1000 MCG tablet Take 1,000 mcg by mouth daily.    [name] glucose blood test strip Use as instructed 100 each 4  . Insulin Pen Needle (UNIFINE PENTIPS) 31G X 5 MM MISC USE AS DIRECTED 100 each 0   No current facility-administered medications for this visit.     Objective: BP 130/70 (BP Location: Left Arm, Patient Position: Sitting, Cuff Size: Large)   Pulse 77   Temp 98.5 F (36.9 C) (Oral)   Ht 5\' 7"  (1.702 m)   Wt 239 lb 3.2 oz (108.5 kg)   SpO2 95%   BMI 37.46 kg/m  Gen: NAD at rest, fatigued when walking down the hall CV: RRR no murmurs rubs or  gallops Lungs: CTAB no crackles, wheeze, rhonchi Abdomen: soft/nontender/nondistended/normal bowel sounds. obese Ext: 2+ edema Skin: warm, dry  Assessment/Plan:  Patient Instructions  Increase to 2 lasix each morning and 1 in afternoon (80mg  then 40mg ).   Follow up next Wednesday at 11 30- schedule before you go. iF weight increases despite these changes need to see [place] sooner  Please stop by lab before you go    Uncontrolled type 2 diabetes mellitus with stage 3 chronic kidney disease, with long-term current use of insulin ([place]) S: well controlled. On last check last year but did not follow upin 3 months as instructed. Has some pain from neuropathy Lab Results  Component Value Date   HGBA1C 6.8 03/26/2016   HGBA1C 6.9 06/26/2015   HGBA1C 7.4 (H) 05/07/2015   A/P: update a1c today, asks about meds for neuropathy but discussed would not want new start before labs and with changes in lasix planned.   Chronic systolic CHF (congestive heart failure) (HCC) S: Patient with history of systolic heart failure due to ischemic cardiomyopathy on baseline Lasix reported to me previously as 30 mg which would be an odd dose- later clarified to be 40mg  BID. He also has chronic kidney disease IV and follows with [facility].  Over last 3 weeks, weight has gone up despite compliance with lasix. Prior no edema but has developed significant edema in this time frame.  A/P: CHF exacerbation likely- will do as AVS notes below. Also update labs to look for other causes of edema. With ckd IV not planning on adding potassium at present. Needs to watch salt in diet.   CKD (chronic kidney disease) stage 4, GFR 15-29 ml/min (HCC) Update kidney function with worsened edema  1 week  Orders Placed This Encounter  Procedures  . CBC    [place]  . Comprehensive metabolic panel    [place]  . TSH    [place]  . Hemoglobin A1c    [place]   Return precautions advised.  Garret Reddish, MD

## 2017-01-28 NOTE — Telephone Encounter (Signed)
Pt need new Rx for furosemide take 3 times a day (120 MG total)   Pharm:  [facility] at [facility].    Going forward pt would like to have all of his prescription to go to [place] [facility] on [address].

## 2017-01-28 NOTE — Assessment & Plan Note (Signed)
Update kidney function with worsened edema

## 2017-02-02 ENCOUNTER — Encounter: Payer: Self-pay | Admitting: Internal Medicine

## 2017-02-02 ENCOUNTER — Other Ambulatory Visit: Payer: Self-pay

## 2017-02-02 MED ORDER — FUROSEMIDE 40 MG PO TABS: ORAL_TABLET | ORAL | 1 refills | Status: DC

## 2017-02-02 NOTE — Telephone Encounter (Signed)
Prescription sent to pharmacy as requested.

## 2017-02-03 ENCOUNTER — Encounter: Payer: Self-pay | Admitting: Family Medicine

## 2017-02-03 ENCOUNTER — Other Ambulatory Visit: Payer: Self-pay | Admitting: Family Medicine

## 2017-02-03 ENCOUNTER — Ambulatory Visit (INDEPENDENT_AMBULATORY_CARE_PROVIDER_SITE_OTHER): Payer: PPO | Admitting: Family Medicine

## 2017-02-03 ENCOUNTER — Other Ambulatory Visit: Payer: Self-pay

## 2017-02-03 ENCOUNTER — Other Ambulatory Visit: Payer: Self-pay | Admitting: Cardiology

## 2017-02-03 VITALS — BP 140/84 | HR 82 | Temp 98.2°F | Ht 67.0 in | Wt 235.8 lb

## 2017-02-03 DIAGNOSIS — E1165 Type 2 diabetes mellitus with hyperglycemia: Secondary | ICD-10-CM

## 2017-02-03 DIAGNOSIS — I10 Essential (primary) hypertension: Secondary | ICD-10-CM | POA: Diagnosis not present

## 2017-02-03 DIAGNOSIS — I5022 Chronic systolic (congestive) heart failure: Secondary | ICD-10-CM

## 2017-02-03 DIAGNOSIS — N183 Chronic kidney disease, stage 3 (moderate): Secondary | ICD-10-CM

## 2017-02-03 DIAGNOSIS — E1122 Type 2 diabetes mellitus with diabetic chronic kidney disease: Secondary | ICD-10-CM | POA: Diagnosis not present

## 2017-02-03 DIAGNOSIS — F329 Major depressive disorder, single episode, unspecified: Secondary | ICD-10-CM

## 2017-02-03 DIAGNOSIS — F32A Depression, unspecified: Secondary | ICD-10-CM

## 2017-02-03 DIAGNOSIS — Z794 Long term (current) use of insulin: Secondary | ICD-10-CM | POA: Diagnosis not present

## 2017-02-03 DIAGNOSIS — IMO0002 Reserved for concepts with insufficient information to code with codable children: Secondary | ICD-10-CM

## 2017-02-03 LAB — BASIC METABOLIC PANEL
BUN: 42 mg/dL — ABNORMAL HIGH (ref 6–23)
CHLORIDE: 103 meq/L (ref 96–112)
CO2: 30 mEq/L (ref 19–32)
Calcium: 9.4 mg/dL (ref 8.4–10.5)
Creatinine, Ser: 2.91 mg/dL — ABNORMAL HIGH (ref 0.40–1.50)
GFR: 22.11 mL/min — AB (ref >60.00)
Glucose, Bld: 127 mg/dL — ABNORMAL HIGH (ref 70–99)
POTASSIUM: 4.6 meq/L (ref 3.5–5.1)
SODIUM: 140 meq/L (ref 135–145)

## 2017-02-03 MED ORDER — GLUCOSE BLOOD VI STRP: ORAL_STRIP | 11 refills | Status: DC

## 2017-02-03 NOTE — Assessment & Plan Note (Signed)
S: poorly controlled. On long acting insulin 60 units daily He admits to eating chocolate bars, ice creams, milk shake on regular basis and poor dietary choices. Since a1c has started to eat healthier for example fresh fruit instead of chocolate Lab Results  Component Value Date   HGBA1C 8.2 (H) 01/28/2017   HGBA1C 6.8 03/26/2016   HGBA1C 6.9 06/26/2015   A/P: we discussed adding short acting insulin with meals but he would prefer to tighten up diet. This mornign fasting BG was 108 after being around 150 in prior days. Post meals several 200s or even 1 300 so will have to keep considering mealtime. Next visit need to make plan for 05/01/17 or later visit for follow up

## 2017-02-03 NOTE — Assessment & Plan Note (Signed)
S: patient has been compliant for 1 week with 2 lasix in AM and 1 in PM up from 1 tablet BID. He is now Able to get shoes on and has less edema. Weight has gone down 4 lbs! DOE not improved A/P: improved control- continue current regimen for 1 more week then reduce to 2 tablets in Am, 1 in PM alternating with 1 tablet BID for another week then see me back in office. Update bmet today for potassium and to make sure no significant worsening of kidney function considering CKD IV

## 2017-02-03 NOTE — Patient Instructions (Signed)
For 1 more week continue lasix 2 in the morning and 1 in the afternoon  Then after that week:  Take 2 lasix in AM on Monday and 1 in the afternoon Next day 1 lasix in Am and 1 in the afternoon Continue to alternate like this until we see you back  Check on potassium before you leave  See me in about 2 weeks- schedule before you leave

## 2017-02-03 NOTE — Progress Notes (Signed)
Subjective:  Rodney Farley is a 83 y.o. year old very pleasant male patient who presents for/with See problem oriented charting ROS- still with shortness of breath going to mailbox- not improved or worsened. No chest pain. Edema slightly improved. Weight down.   Past Medical History-  Patient Active Problem List   Diagnosis Date Noted  . COPD (chronic obstructive pulmonary disease) ([place])     Priority: High  . Chronic systolic CHF (congestive heart failure) ([place]) 04/08/2015    Priority: High  . DOE (dyspnea on exertion), due to significant CAD 03/28/2015    Priority: High  . CKD (chronic kidney disease) stage 4, GFR 15-29 ml/min (HCC) 03/04/2015    Priority: High  . Coronary artery disease involving native coronary artery of native heart with angina pectoris ([place]) 09/13/2014    Priority: High  . Uncontrolled type 2 diabetes mellitus with stage 3 chronic kidney disease, with long-term current use of insulin ([place]) 08/05/2012    Priority: High  . Ischemic cardiomyopathy 11/03/2011    Priority: High  . Atrioventricular block, complete (HCC)     Priority: High  . Pacemaker     Priority: High  . BPH associated with nocturia 04/27/2016    Priority: Medium  . Diabetic peripheral neuropathy (HCC)     Priority: Medium  . Bell's palsy     Priority: Medium  . Gout 06/26/2015    Priority: Medium  . Depression 08/05/2012    Priority: Medium  . Sleep apnea 08/05/2012    Priority: Medium  . HTN (hypertension) 08/05/2012    Priority: Medium  . Diverticulitis 04/08/2015    Priority: Low  . GERD (gastroesophageal reflux disease) 08/07/2012    Priority: Low  . Obesity     Priority: Low  . Current use of long term anticoagulation - Plavix 06/10/2016  . Trigger finger 06/10/2016    Medications- reviewed and updated Current Outpatient Prescriptions  Medication Sig Dispense Refill  . aspirin EC 81 MG tablet Take 81 mg by mouth daily.    [name] atorvastatin (LIPITOR) 40 MG tablet Take 1  tablet (40 mg total) by mouth daily. 90 tablet 3  . carvedilol (COREG) 12.5 MG tablet Take 1 tablet (12.5 mg total) by mouth 2 (two) times daily with a meal. 60 tablet 11  . clopidogrel (PLAVIX) 75 MG tablet TAKE 1 TABLET BY MOUTH ONCE DAILY WITH BREAKFAST 30 tablet 2  . furosemide (LASIX) 40 MG tablet 2 lasix each morning and 1 in afternoon (80mg  then 40mg ). 270 tablet 1  . hydrALAZINE (APRESOLINE) 25 MG tablet TAKE 1 & 1/2 (ONE & ONE-HALF) TABLETS BY MOUTH TWICE DAILY 90 tablet 1  . Insulin Glargine (LANTUS SOLOSTAR) 100 UNIT/ML Solostar Pen INJECT 60 UNITS UNDER THE SKIN ONCE DAILY 45 mL 2  . Insulin Pen Needle (UNIFINE PENTIPS) 31G X 5 MM MISC USE AS DIRECTED 100 each 0  . isosorbide mononitrate (IMDUR) 30 MG 24 hr tablet TAKE 1 TABLET BY MOUTH ONCE DAILY 30 tablet 0  . NITROSTAT 0.4 MG SL tablet PLACE 1 TABLET (0.4 MG TOTAL) UNDER THE TONGUE EVERY 5 (FIVE) MINUTES X 3 DOSES AS NEEDED FOR CHEST PAIN. 25 tablet 3  . OVER THE COUNTER MEDICATION Place 1 drop into both eyes 3 (three) times daily. OTC Bausch-Lomb eye bath    . pantoprazole (PROTONIX) 40 MG tablet TAKE 1 TABLET BY MOUTH ONCE DAILY 30 tablet 0  . sertraline (ZOLOFT) 100 MG tablet TAKE ONE AND ONE-HALF TABLETS BY MOUTH ONCE DAILY  135 tablet 0  . tamsulosin (FLOMAX) 0.4 MG CAPS capsule TAKE 1 CAPSULE BY MOUTH DAILY AFTER SUPPER 30 capsule 11  . vitamin B-12 (CYANOCOBALAMIN) 1000 MCG tablet Take 1,000 mcg by mouth daily.    [name] glucose blood test strip Use to test blood sugar 4 times a day and PRN 100 each 11   No current facility-administered medications for this visit.     Objective: BP 140/84 (BP Location: Right Arm, Patient Position: Sitting, Cuff Size: Large)   Pulse 82   Temp 98.2 F (36.8 C) (Oral)   Ht 5\' 7"  (1.702 m)   Wt 235 lb 12.8 oz (107 kg)   SpO2 95%   BMI 36.93 kg/m  Gen: NAD, resting comfortably CV: RRR no murmurs rubs or gallops Lungs: CTAB no crackles, wheeze, rhonchi Ext: 2+ edema but slightly  improved Skin: warm, dry  Assessment/Plan:  HTN (hypertension) S: controlled slightly poorly on Coreg 12.5mg  BID, lasix 40mg  BID (actually 80mg  Am and 40 PM right now), hydralazine 25mg  BID, imdur 30mg  XL BP Readings from Last 3 Encounters:  02/03/17 140/84  01/28/17 130/70  01/27/17 (!) 144/82  A/P: We discussed blood pressure goal of <140/90. Continue current meds:  Suspect as we continue to improve fluid status that BP will improve   Chronic systolic CHF (congestive heart failure) ([place]) S: patient has been compliant for 1 week with 2 lasix in AM and 1 in PM up from 1 tablet BID. He is now Able to get shoes on and has less edema. Weight has gone down 4 lbs! DOE not improved A/P: improved control- continue current regimen for 1 more week then reduce to 2 tablets in Am, 1 in PM alternating with 1 tablet BID for another week then see me back in office. Update bmet today for potassium and to make sure no significant worsening of kidney function considering CKD IV  Uncontrolled type 2 diabetes mellitus with stage 3 chronic kidney disease, with long-term current use of insulin ([place]) S: poorly controlled. On long acting insulin 60 units daily He admits to eating chocolate bars, ice creams, milk shake on regular basis and poor dietary choices. Since a1c has started to eat healthier for example fresh fruit instead of chocolate Lab Results  Component Value Date   HGBA1C 8.2 (H) 01/28/2017   HGBA1C 6.8 03/26/2016   HGBA1C 6.9 06/26/2015   A/P: we discussed adding short acting insulin with meals but he would prefer to tighten up diet. This mornign fasting BG was 108 after being around 150 in prior days. Post meals several 200s or even 1 300 so will have to keep considering mealtime. Next visit need to make plan for 05/01/17 or later visit for follow up  2 weeks  Orders Placed This Encounter  Procedures  . Basic metabolic panel    [place]   Return precautions advised.  Garret Reddish,  MD

## 2017-02-03 NOTE — Assessment & Plan Note (Signed)
S: controlled slightly poorly on Coreg 12.5mg  BID, lasix 40mg  BID (actually 80mg  Am and 40 PM right now), hydralazine 25mg  BID, imdur 30mg  XL BP Readings from Last 3 Encounters:  02/03/17 140/84  01/28/17 130/70  01/27/17 (!) 144/82  A/P: We discussed blood pressure goal of <140/90. Continue current meds:  Suspect as we continue to improve fluid status that BP will improve

## 2017-02-04 ENCOUNTER — Encounter: Payer: Self-pay | Admitting: Family Medicine

## 2017-02-04 ENCOUNTER — Ambulatory Visit: Payer: PPO | Admitting: Family Medicine

## 2017-02-04 ENCOUNTER — Telehealth: Payer: Self-pay | Admitting: Family Medicine

## 2017-02-04 MED ORDER — FREESTYLE SYSTEM KIT: PACK | 1 refills | Status: DC

## 2017-02-04 MED ORDER — GLUCOSE BLOOD VI STRP: ORAL_STRIP | 12 refills | Status: DC

## 2017-02-04 NOTE — Telephone Encounter (Signed)
Sertraline originally prescribed by Dr. Corliss Parish. Pt now seeing Dr. Garret Reddish at [facility] Forwarded refill request to Dr. Yong Channel

## 2017-02-04 NOTE — Telephone Encounter (Signed)
Roselyn Reef pts wife returned your call

## 2017-02-04 NOTE — Telephone Encounter (Signed)
Yes thanks, may fill 

## 2017-02-14 ENCOUNTER — Encounter: Payer: Self-pay | Admitting: Internal Medicine

## 2017-02-18 ENCOUNTER — Ambulatory Visit (INDEPENDENT_AMBULATORY_CARE_PROVIDER_SITE_OTHER): Payer: PPO | Admitting: Family Medicine

## 2017-02-18 ENCOUNTER — Encounter: Payer: Self-pay | Admitting: Family Medicine

## 2017-02-18 DIAGNOSIS — I5022 Chronic systolic (congestive) heart failure: Secondary | ICD-10-CM | POA: Diagnosis not present

## 2017-02-18 DIAGNOSIS — E1165 Type 2 diabetes mellitus with hyperglycemia: Secondary | ICD-10-CM | POA: Diagnosis not present

## 2017-02-18 DIAGNOSIS — N183 Chronic kidney disease, stage 3 (moderate): Secondary | ICD-10-CM | POA: Diagnosis not present

## 2017-02-18 DIAGNOSIS — E1122 Type 2 diabetes mellitus with diabetic chronic kidney disease: Secondary | ICD-10-CM

## 2017-02-18 DIAGNOSIS — IMO0002 Reserved for concepts with insufficient information to code with codable children: Secondary | ICD-10-CM

## 2017-02-18 DIAGNOSIS — Z630 Problems in relationship with spouse or partner: Secondary | ICD-10-CM | POA: Diagnosis not present

## 2017-02-18 DIAGNOSIS — Z794 Long term (current) use of insulin: Secondary | ICD-10-CM

## 2017-02-18 DIAGNOSIS — I10 Essential (primary) hypertension: Secondary | ICD-10-CM | POA: Diagnosis not present

## 2017-02-18 NOTE — Assessment & Plan Note (Signed)
S: controlled mildly poorly on  coreg 12.5mg  BID, lasix 40mg  BID (80mg  AM, 40 PM had been short term), hydralazine 25mg  BID, imdur 30mg  XL.  BP Readings from Last 3 Encounters:  02/18/17 (!) 142/78  02/03/17 140/84  01/28/17 130/70  A/P: We discussed blood pressure goal of <140/90. Continue current meds:  Would go back up on lasix to 80mg  in AM, 40 mg in PM as suspect fluid overload contributing to hypertension

## 2017-02-18 NOTE — Patient Instructions (Signed)
Lets go back to 2 lasix in the morning and 1 in the afternoon  See me in about 2-3 weeks

## 2017-02-18 NOTE — Assessment & Plan Note (Signed)
S: weight is up 2 lbs (last time down 4 lbs)- we had been on 2 lasix in AM and 1 in PM up from 1 tablet BID (40mg ). Last week we went back to BID 40 mg only Wt Readings from Last 3 Encounters:  02/18/17 237 lb 3.2 oz (107.6 kg)  02/03/17 235 lb 12.8 oz (107 kg)  01/28/17 239 lb 3.2 oz (108.5 kg)  A/P: weight trending up- will go back to 80mg  in AM and 40mg  in PM lasix- suspect BP up due to fluid overload as well. Needs to follow up with nephrology- he walked out today (frustrated waiting- but really more frustrated over wife leaving for [place])

## 2017-02-18 NOTE — Assessment & Plan Note (Signed)
S: last visit reviewed a1c of 8.2- he wanted to tighten up diet instead of adding short acting insulin. On long actiing insulin 60 units a day- eating chocolate bars, ice creams, milk shakes on regular basis though - has cut this out for most part.  A/P: suspect improved already- did not check CBGs with stress of wife leaving him. Will plan on 2-3 week follow up, sooner if needed

## 2017-02-18 NOTE — Progress Notes (Signed)
Subjective:  Rodney Farley is a 83 y.o. year old very pleasant male patient who presents for/with See problem oriented charting ROS- edema stable or slightly worse, no chest pain, DOE stable from last visit, admits to stress. No SI.    Past Medical History-  Patient Active Problem List   Diagnosis Date Noted  . COPD (chronic obstructive pulmonary disease) ([place])     Priority: High  . Chronic systolic CHF (congestive heart failure) ([place]) 04/08/2015    Priority: High  . DOE (dyspnea on exertion), due to significant CAD 03/28/2015    Priority: High  . CKD (chronic kidney disease) stage 4, GFR 15-29 ml/min (HCC) 03/04/2015    Priority: High  . Coronary artery disease involving native coronary artery of native heart with angina pectoris ([place]) 09/13/2014    Priority: High  . Uncontrolled type 2 diabetes mellitus with stage 3 chronic kidney disease, with long-term current use of insulin ([place]) 08/05/2012    Priority: High  . Ischemic cardiomyopathy 11/03/2011    Priority: High  . Atrioventricular block, complete (HCC)     Priority: High  . Pacemaker     Priority: High  . BPH associated with nocturia 04/27/2016    Priority: Medium  . Diabetic peripheral neuropathy (HCC)     Priority: Medium  . Bell's palsy     Priority: Medium  . Gout 06/26/2015    Priority: Medium  . Depression 08/05/2012    Priority: Medium  . Sleep apnea 08/05/2012    Priority: Medium  . HTN (hypertension) 08/05/2012    Priority: Medium  . Diverticulitis 04/08/2015    Priority: Low  . GERD (gastroesophageal reflux disease) 08/07/2012    Priority: Low  . Obesity     Priority: Low  . Marital stress 02/18/2017  . Current use of long term anticoagulation - Plavix 06/10/2016  . Trigger finger 06/10/2016    Medications- reviewed and updated Current Outpatient Prescriptions  Medication Sig Dispense Refill  . aspirin EC 81 MG tablet Take 81 mg by mouth daily.    [name] atorvastatin (LIPITOR) 40 MG tablet  Take 1 tablet (40 mg total) by mouth daily. 90 tablet 3  . Blood Glucose Monitoring Suppl (FREESTYLE LITE) DEVI     . carvedilol (COREG) 12.5 MG tablet Take 1 tablet (12.5 mg total) by mouth 2 (two) times daily with a meal. 60 tablet 11  . clopidogrel (PLAVIX) 75 MG tablet TAKE 1 TABLET BY MOUTH ONCE DAILY WITH BREAKFAST 30 tablet 2  . furosemide (LASIX) 40 MG tablet 2 lasix each morning and 1 in afternoon (36m then 458m. 270 tablet 1  . glucose blood (FREESTYLE TEST STRIPS) test strip Use as instructed 100 each 12  . glucose monitoring kit (FREESTYLE) monitoring kit USE TO MONITOR BLOOD GLUCOSE AS DIRECTED 1 each 1  . hydrALAZINE (APRESOLINE) 25 MG tablet TAKE 1 & 1/2 (ONE & ONE-HALF) TABLETS BY MOUTH TWICE DAILY 90 tablet 1  . Insulin Glargine (LANTUS SOLOSTAR) 100 UNIT/ML Solostar Pen INJECT 60 UNITS UNDER THE SKIN ONCE DAILY 45 mL 2  . Insulin Pen Needle (UNIFINE PENTIPS) 31G X 5 MM MISC USE AS DIRECTED 100 each 0  . isosorbide mononitrate (IMDUR) 30 MG 24 hr tablet TAKE 1 TABLET BY MOUTH ONCE DAILY 30 tablet 0  . NITROSTAT 0.4 MG SL tablet PLACE 1 TABLET (0.4 MG TOTAL) UNDER THE TONGUE EVERY 5 (FIVE) MINUTES X 3 DOSES AS NEEDED FOR CHEST PAIN. 25 tablet 3  . OVER THE COUNTER MEDICATION  Place 1 drop into both eyes 3 (three) times daily. OTC Bausch-Lomb eye bath    . pantoprazole (PROTONIX) 40 MG tablet TAKE 1 TABLET BY MOUTH ONCE DAILY 30 tablet 0  . sertraline (ZOLOFT) 100 MG tablet TAKE ONE AND ONE-HALF TABLETS BY MOUTH ONCE DAILY 135 tablet 1  . tamsulosin (FLOMAX) 0.4 MG CAPS capsule TAKE 1 CAPSULE BY MOUTH DAILY AFTER SUPPER 30 capsule 11  . vitamin B-12 (CYANOCOBALAMIN) 1000 MCG tablet Take 1,000 mcg by mouth daily.     No current facility-administered medications for this visit.     Objective: BP (!) 142/78   Pulse 78   Temp 98.5 F (36.9 C) (Oral)   Ht '5\' 7"'  (1.702 m)   Wt 237 lb 3.2 oz (107.6 kg)   SpO2 94%   BMI 37.15 kg/m  Gen: NAD, resting comfortably CV: RRR no  murmurs rubs or gallops Lungs: CTAB no crackles, wheeze, rhonchi Abdomen: soft/nontender/nondistended/normal bowel sounds. No rebound or guarding.  Ext: stable 2+ edema Skin: warm, dry  Assessment/Plan:  HTN (hypertension) S: controlled mildly poorly on  coreg 12.70m BID, lasix 428mBID (8048mM, 40 PM had been short term), hydralazine 65m43mD, imdur 30mg90m  BP Readings from Last 3 Encounters:  02/18/17 (!) 142/78  02/03/17 140/84  01/28/17 130/70  A/P: We discussed blood pressure goal of <140/90. Continue current meds:  Would go back up on lasix to 80mg 74mM, 40 mg in PM as suspect fluid overload contributing to hypertension  Chronic systolic CHF (congestive heart failure) (HCC) S: weight is up 2 lbs (last time down 4 lbs)- we had been on 2 lasix in AM and 1 in PM up from 1 tablet BID (40mg).103mt week we went back to BID 40 mg only Wt Readings from Last 3 Encounters:  02/18/17 237 lb 3.2 oz (107.6 kg)  02/03/17 235 lb 12.8 oz (107 kg)  01/28/17 239 lb 3.2 oz (108.5 kg)  A/P: weight trending up- will go back to 80mg in79mand 40mg in 57masix- suspect BP up due to fluid overload as well. Needs to follow up with nephrology- he walked out today (frustrated waiting- but really more frustrated over wife leaving for [place])  Uncontrolled type 2 diabetes mellitus with stage 3 chronic kidney disease, with long-term current use of insulin (HCC) S: [place] visit reviewed a1c of 8.2- he wanted to tighten up diet instead of adding short acting insulin. On long actiing insulin 60 units a day- eating chocolate bars, ice creams, milk shakes on regular basis though - has cut this out for most part.  A/P: suspect improved already- did not check CBGs with stress of wife leaving him. Will plan on 2-3 week follow up, sooner if needed   Marital stress Wife left him to go back to [place] both want to live where their grandkids and kids are. Refer to behavioral health given major life  stressor. Will keep me updated- would also consider antidepressant given severity of stress. No SI/HI.   Return in about 3 weeks (around 03/11/2017).  Meds ordered this encounter  Medications  . Blood Glucose Monitoring Suppl (FREESTYLE LITE) DEVI    Return precautions advised.  [place]iley [place] HGarret Reddish

## 2017-02-18 NOTE — Assessment & Plan Note (Signed)
Wife left him to go back to [place] 02/2017- they both want to live where their grandkids and kids are. Refer to behavioral health given major life stressor. Will keep me updated- would also consider antidepressant given severity of stress. No SI/HI.

## 2017-03-04 ENCOUNTER — Encounter: Payer: Self-pay | Admitting: Family Medicine

## 2017-03-04 ENCOUNTER — Ambulatory Visit: Payer: PPO | Admitting: Family Medicine

## 2017-03-05 ENCOUNTER — Other Ambulatory Visit: Payer: Self-pay | Admitting: Cardiology

## 2017-03-08 NOTE — Telephone Encounter (Signed)
REFILL 

## 2017-03-09 ENCOUNTER — Ambulatory Visit (INDEPENDENT_AMBULATORY_CARE_PROVIDER_SITE_OTHER): Payer: PPO | Admitting: *Deleted

## 2017-03-09 DIAGNOSIS — I5022 Chronic systolic (congestive) heart failure: Secondary | ICD-10-CM

## 2017-03-10 ENCOUNTER — Ambulatory Visit: Payer: Self-pay

## 2017-03-10 ENCOUNTER — Encounter: Payer: Self-pay | Admitting: Sports Medicine

## 2017-03-10 ENCOUNTER — Ambulatory Visit (INDEPENDENT_AMBULATORY_CARE_PROVIDER_SITE_OTHER): Payer: PPO | Admitting: Sports Medicine

## 2017-03-10 VITALS — BP 130/80 | HR 75 | Ht 67.0 in | Wt 235.6 lb

## 2017-03-10 DIAGNOSIS — M65332 Trigger finger, left middle finger: Secondary | ICD-10-CM | POA: Diagnosis not present

## 2017-03-10 DIAGNOSIS — M65331 Trigger finger, right middle finger: Secondary | ICD-10-CM | POA: Diagnosis not present

## 2017-03-10 DIAGNOSIS — Z630 Problems in relationship with spouse or partner: Secondary | ICD-10-CM | POA: Diagnosis not present

## 2017-03-10 DIAGNOSIS — M65339 Trigger finger, unspecified middle finger: Secondary | ICD-10-CM

## 2017-03-10 LAB — CUP PACEART REMOTE DEVICE CHECK
Battery Remaining Longevity: 51 mo
Battery Voltage: 2.96 V
Brady Statistic RA Percent Paced: 96.81 %
HighPow Impedance: 69 Ohm
Implantable Lead Implant Date: 20100112
Implantable Lead Implant Date: 20170313
Implantable Lead Location: 753858
Implantable Lead Location: 753859
Implantable Lead Model: 4598
Implantable Pulse Generator Implant Date: 20170313
Lead Channel Impedance Value: 399 Ohm
Lead Channel Impedance Value: 456 Ohm
Lead Channel Impedance Value: 475 Ohm
Lead Channel Impedance Value: 475 Ohm
Lead Channel Impedance Value: 475 Ohm
Lead Channel Impedance Value: 551 Ohm
Lead Channel Impedance Value: 836 Ohm
Lead Channel Pacing Threshold Amplitude: 1.375 V
Lead Channel Pacing Threshold Pulse Width: 0.4 ms
Lead Channel Pacing Threshold Pulse Width: 0.4 ms
Lead Channel Pacing Threshold Pulse Width: 0.4 ms
Lead Channel Sensing Intrinsic Amplitude: 1.75 mV
Lead Channel Sensing Intrinsic Amplitude: 1.75 mV
Lead Channel Sensing Intrinsic Amplitude: 13.5 mV
Lead Channel Setting Pacing Amplitude: 3 V
Lead Channel Setting Pacing Amplitude: 3 V
Lead Channel Setting Pacing Pulse Width: 0.4 ms
MDC IDC LEAD IMPLANT DT: 20170313
MDC IDC LEAD LOCATION: 753860
MDC IDC MSMT LEADCHNL LV IMPEDANCE VALUE: 475 Ohm
MDC IDC MSMT LEADCHNL LV IMPEDANCE VALUE: 817 Ohm
MDC IDC MSMT LEADCHNL LV IMPEDANCE VALUE: 817 Ohm
MDC IDC MSMT LEADCHNL LV IMPEDANCE VALUE: 836 Ohm
MDC IDC MSMT LEADCHNL LV IMPEDANCE VALUE: 836 Ohm
MDC IDC MSMT LEADCHNL RA PACING THRESHOLD AMPLITUDE: 1.5 V
MDC IDC MSMT LEADCHNL RV IMPEDANCE VALUE: 361 Ohm
MDC IDC MSMT LEADCHNL RV PACING THRESHOLD AMPLITUDE: 0.5 V
MDC IDC MSMT LEADCHNL RV SENSING INTR AMPL: 13.5 mV
MDC IDC SESS DTM: 20180828185000
MDC IDC SET LEADCHNL LV PACING PULSEWIDTH: 0.4 ms
MDC IDC SET LEADCHNL RV PACING AMPLITUDE: 2.5 V
MDC IDC SET LEADCHNL RV SENSING SENSITIVITY: 0.3 mV
MDC IDC STAT BRADY AP VP PERCENT: 96.88 %
MDC IDC STAT BRADY AP VS PERCENT: 0.02 %
MDC IDC STAT BRADY AS VP PERCENT: 3.1 %
MDC IDC STAT BRADY AS VS PERCENT: 0 %
MDC IDC STAT BRADY RV PERCENT PACED: 99.86 %

## 2017-03-10 NOTE — Progress Notes (Signed)
Remote transmission 

## 2017-03-10 NOTE — Assessment & Plan Note (Addendum)
Unfortunately the patient's wife did leave several weeks ago to return to [place].  He is having a significantly difficult time and is interested in referral to psychology as well as considering the addition of a SSRI.  Did tell him that I would reach out to Dr. Yong Channel regarding potentially starting medication as well as ensuring that the referral to Dr. Ansel Bong recommendation has been provided.  Otherwise Kandis Ban in our building I am confident she would be happy to see him.  He denies any SI or HI.

## 2017-03-10 NOTE — Assessment & Plan Note (Addendum)
Bilateral trigger fingers injected today under ultrasound.  If any lack of improvement with either of these he will need surgical consultation for open release.  We will plan to check in 4 weeks to ensure clinical improvement.

## 2017-03-10 NOTE — Patient Instructions (Signed)

## 2017-03-10 NOTE — Progress Notes (Signed)
OFFICE VISIT NOTE Rod[facility] Farley. [facility] Farley, [place] [facility]  Demonte Farley - 83 y.o. male MRN [id number]  Date of birth: [birth year]  Visit Date: 03/10/2017  PCP: [name], MD   Referred by: [name], MD  Burlene Arnt, CMA acting as scribe for Dr. [name].  SUBJECTIVE:   Chief Complaint  Patient presents with  . Follow-up    trigger middle finger   HPI: As below and per problem based documentation when appropriate.  [name] is an established patient presenting today in follow-up of trigger middle finger of LT and RT hand. He was last seen 01/27/2017 and had injection on middle finger LT hand. Pain is currently in both middle finders. He got some relief after injection. At times both middle fingers will lock up and are very pain. No swelling.   He reports significant worsening in his mood and affect due to his wife leaving for [place] to be with her children.  He is quite upset with this obviously but denies any SI or HI.    Review of Systems  Constitutional: Negative for chills and fever.  Respiratory: Positive for shortness of breath.   Cardiovascular: Positive for leg swelling. Negative for chest pain and palpitations.  Neurological: Positive for tingling (in feet). Negative for dizziness and headaches.  Endo/Heme/Allergies: Bruises/bleeds easily.    Otherwise per HPI.  HISTORY & PERTINENT PRIOR DATA:  No specialty comments available. He reports that he quit smoking about 9 years ago. His smoking use included Cigarettes. He has a 81.00 pack-year smoking history. He has never used smokeless tobacco.   Recent Labs  03/26/16 1423 01/28/17 1328  HGBA1C 6.8 8.2*   Medications & Allergies reviewed per EMR Patient Active Problem List   Diagnosis Date Noted  . Marital stress 02/18/2017  . Current use of long term anticoagulation - Plavix 06/10/2016  . Trigger finger 06/10/2016  . BPH  associated with nocturia 04/27/2016  . Diabetic peripheral neuropathy ([place])   . COPD (chronic obstructive pulmonary disease) ([place])   . Bell's palsy   . Gout 06/26/2015  . Chronic systolic CHF (congestive heart failure) ([place]) 04/08/2015  . Diverticulitis 04/08/2015  . DOE (dyspnea on exertion), due to significant CAD 03/28/2015  . CKD (chronic kidney disease) stage 4, GFR 15-29 ml/min (HCC) 03/04/2015  . Coronary artery disease involving native coronary artery of native heart with angina pectoris ([place]) 09/13/2014  . GERD (gastroesophageal reflux disease) 08/07/2012  . Depression 08/05/2012  . Sleep apnea 08/05/2012  . Uncontrolled type 2 diabetes mellitus with stage 3 chronic kidney disease, with long-term current use of insulin ([place]) 08/05/2012  . HTN (hypertension) 08/05/2012  . Ischemic cardiomyopathy 11/03/2011  . Atrioventricular block, complete ([place])   . Obesity   . Pacemaker    Past Medical History:  Diagnosis Date  . Anginal pain ([place])   . Anxiety   . Arm pain 05/08/2015   LEFT ARM  . Atrioventricular block, complete ([place])    a. 2010 s/p pacemaker.  . Bell's palsy    left side. after shingles episode  . Chronic systolic CHF (congestive heart failure) ([place])    a. LVEF 35-40% by echo 09/2014.  [name] CKD (chronic kidney disease), stage IV ([place])   . COPD (chronic obstructive pulmonary disease) (HCC)    Severe  . Coronary artery disease    a. s/p MI in 1994/1995 while in [place] s/p questionable PCI. 03/2015: progression of disease,  for staged PCI.  [name] Depression   . Diabetic peripheral neuropathy ([place])   . DOE (dyspnea on exertion) 03/28/2015  . GERD (gastroesophageal reflux disease)   . Hypercholesterolemia   . Hypertension   . MI (myocardial infarction) ([place]) 1994; 1995  . Neuropathy    IN LOWER EXTREMITIES  . Obesity   . Pacemaker    medtronic>>> MDT ICD 09/23/15  . Sleep apnea    "sleeps w/humidifyer when he panics and gets short of breath" (04/08/2015)  . TIA  (transient ischemic attack) X 3  . Type II diabetes mellitus (HCC)    Family History  Problem Relation Age of Onset  . Stroke Mother   . Leukemia Father   . Stroke Sister   . Heart attack Brother    Past Surgical History:  Procedure Laterality Date  . CARDIAC CATHETERIZATION N/A 03/29/2015   Procedure: Right/Left Heart Cath and Coronary Angiography;  Surgeon: Xavius M [name], MD;  Location: [facility];  Service: Cardiovascular;  Laterality: N/A;  . [place]   "after my MI; put me on heart RX after cath"  . CARDIAC CATHETERIZATION N/A 04/09/2015   Procedure: Coronary Stent Intervention;  Surgeon: Maui M [name], MD;  Location: [facility];  Service: Cardiovascular;  Laterality: N/A;  . CHOLECYSTECTOMY OPEN  ~ 1977  . EP IMPLANTABLE DEVICE N/A 09/23/2015   MDT CRT-D, Dr. Caryl Comes  . HIATAL HERNIA REPAIR  1977  . INSERT / REPLACE / REMOVE PACEMAKER  07/2008   Complete heart block status post DDD with good function  . TONSILLECTOMY     Social History   Occupational History  . Retired    Social History Main Topics  . Smoking status: Former Smoker    Packs/day: 1.50    Years: 54.00    Types: Cigarettes    Quit date: 07/18/2007  . Smokeless tobacco: Never Used  . Alcohol use No     Comment: 04/08/2015 "probably 3 drinks/month"  . Drug use: No  . Sexual activity: No    OBJECTIVE:  VS:  HT:5\' 7"  (170.2 cm)   WT:235 lb 9.6 oz (106.9 kg)  BMI:36.89    BP:130/80  HR:75bpm  TEMP: ( )  RESP:96 % EXAM: No additional findings.    US Guided Needle Placement(no Linked Charges)  Result Date: 03/10/2017 [name], DO     03/16/2017 12:50 PM PROCEDURE NOTE -  ULTRASOUND GUIDEDINJECTION: Bilateral Middle Finger Trigger Finger Injections Images were obtained and interpreted by myself, Teresa Coombs, DO Images have been saved and stored to PACS system. Images obtained on: GE S7 Ultrasound machine  ULTRASOUND FINDINGS: Marked thickening of the common  flexor tendon of the third fingers bilaterally with hypoechoic change over the A1 pulley. DESCRIPTION OF PROCEDURE: [name] clinical condition is marked by substantial pain and/or significant functional disability. Other conservative therapy has not provided relief, is contraindicated, or not appropriate. There is a reasonable likelihood that injection will significantly improve the patient's pain and/or functional impairment. After discussing the risks, benefits and expected outcomes of the injection and all questions were reviewed and answered, the patient wished to undergo the above named procedure. Verbal consent was obtained. The ultrasound was used to identify the target structure and adjacent neurovascular structures. The skin was then prepped in sterile fashion and the target structure was injected under direct visualization using sterile technique as below: LEFT HAND: PREP: Alcohol, Ethel Chloride APPROACH: direct inplane, single injection, 25g 1.5" needle INJECTATE: 1cc  1% lidocaine, 1 cc 40mg  DepoMedrol ASPIRATE: N/A DRESSING: Band-Aid RIGHT HAND: PREP: Alcohol, Ethel Chloride APPROACH: direct inplane, single injection, 25g 1.5" needle INJECTATE: 1cc 1% lidocaine, 1 cc 40mg  DepoMedrol ASPIRATE: N/A DRESSING: Band-Aid Post procedural instructions including recommending icing and warning signs for infection were reviewed. This procedure was well tolerated and there were no complications.  IMPRESSION: Succesful US Guided Injections    ASSESSMENT & PLAN:     ICD-10-CM   1. Trigger middle finger of left hand M65.332 US GUIDED NEEDLE PLACEMENT(NO LINKED CHARGES)  2. Marital stress Z63.0   3. Trigger middle finger of right hand M65.331   ================================================================= Trigger finger Bilateral trigger fingers injected today under ultrasound.  If any lack of improvement with either of these he will need surgical consultation for open release.  We will plan to  check in 4 weeks to ensure clinical improvement.  Marital stress Unfortunately the patient's wife did leave several weeks ago to return to [place].  He is having a significantly difficult time and is interested in referral to psychology as well as considering the addition of a SSRI.  Did tell him that I would reach out to Dr. Yong Channel regarding potentially starting medication as well as ensuring that the referral to Dr. Ansel Bong recommendation has been provided.  Otherwise Kandis Ban in our building I am confident she would be happy to see him.  He denies any SI or HI. =================================================================  Follow-up: Return in about 4 weeks (around 04/07/2017).   CMA/ATC served as Education administrator during this visit. History, Physical, and Plan performed by medical provider. Documentation and orders reviewed and attested to.      Teresa Coombs, [facility] Physician

## 2017-03-16 NOTE — Progress Notes (Signed)
Spoke with Roselyn Reef- she encouraged follow up visit with our office as well as behavioral health to clarify

## 2017-03-16 NOTE — Procedures (Signed)
PROCEDURE NOTE -  ULTRASOUND GUIDEDINJECTION: Bilateral Middle Finger Trigger Finger Injections Images were obtained and interpreted by myself, Teresa Coombs, DO  Images have been saved and stored to PACS system. Images obtained on: GE S7 Ultrasound machine  ULTRASOUND FINDINGS:  Marked thickening of the common flexor tendon of the third fingers bilaterally with hypoechoic change over the A1 pulley.  DESCRIPTION OF PROCEDURE:  [name] clinical condition is marked by substantial pain and/or significant functional disability. Other conservative therapy has not provided relief, is contraindicated, or not appropriate. There is a reasonable likelihood that injection will significantly improve the patient's pain and/or functional impairment. After discussing the risks, benefits and expected outcomes of the injection and all questions were reviewed and answered, the patient wished to undergo the above named procedure. Verbal consent was obtained. The ultrasound was used to identify the target structure and adjacent neurovascular structures. The skin was then prepped in sterile fashion and the target structure was injected under direct visualization using sterile technique as below:  LEFT HAND: PREP: Alcohol, Ethel Chloride APPROACH: direct inplane, single injection, 25g 1.5" needle INJECTATE: 1cc 1% lidocaine, 1 cc 40mg  DepoMedrol ASPIRATE: N/A DRESSING: Band-Aid  RIGHT HAND: PREP: Alcohol, Ethel Chloride APPROACH: direct inplane, single injection, 25g 1.5" needle INJECTATE: 1cc 1% lidocaine, 1 cc 40mg  DepoMedrol ASPIRATE: N/A DRESSING: Band-Aid  Post procedural instructions including recommending icing and warning signs for infection were reviewed. This procedure was well tolerated and there were no complications.   IMPRESSION: Succesful US Guided Injections

## 2017-03-17 ENCOUNTER — Other Ambulatory Visit: Payer: Self-pay | Admitting: Cardiology

## 2017-03-17 DIAGNOSIS — I5022 Chronic systolic (congestive) heart failure: Secondary | ICD-10-CM

## 2017-03-18 ENCOUNTER — Encounter: Payer: Self-pay | Admitting: Family Medicine

## 2017-03-19 ENCOUNTER — Encounter: Payer: Self-pay | Admitting: Cardiology

## 2017-03-19 ENCOUNTER — Telehealth: Payer: Self-pay

## 2017-03-19 NOTE — Telephone Encounter (Signed)
Called and left a detailed voicemail message advising patient to seek care in the ER. Dr. Yong Channel is out of the office today and I discussed patient symptoms with Dr. Volanda Napoleon. She feels that because pain is changing that he will need lab work and/or imaging. I am also informing patient via My Chart.

## 2017-03-19 NOTE — Progress Notes (Signed)
Called patient today leaving a voicemail and sent a My Chart message

## 2017-03-24 ENCOUNTER — Telehealth: Payer: Self-pay

## 2017-03-24 NOTE — Telephone Encounter (Signed)
Called patient and spoke to him. He states that his abdominal pain has gotten better. He states that his BM have gotten normal again after being mustard yellow. He has returned to normal brown. He states his daughter encouraged that he be seen in the ED but he refused. I asked him to schedule an appointment with Dr. Yong Channel to check in. He states his son is coming in [place] from [place] and he has an eye doctor appointment tomorrow. He plans to call tomorrow as he wishes to add his son to his DPR.

## 2017-03-25 ENCOUNTER — Ambulatory Visit (INDEPENDENT_AMBULATORY_CARE_PROVIDER_SITE_OTHER): Payer: PPO | Admitting: Family Medicine

## 2017-03-25 ENCOUNTER — Encounter: Payer: Self-pay | Admitting: Family Medicine

## 2017-03-25 DIAGNOSIS — R1011 Right upper quadrant pain: Secondary | ICD-10-CM | POA: Diagnosis not present

## 2017-03-25 DIAGNOSIS — F32A Depression, unspecified: Secondary | ICD-10-CM

## 2017-03-25 DIAGNOSIS — F329 Major depressive disorder, single episode, unspecified: Secondary | ICD-10-CM

## 2017-03-25 LAB — CBC WITH DIFFERENTIAL/PLATELET
BASOS ABS: 0.1 10*3/uL (ref 0.0–0.1)
Basophils Relative: 0.3 % (ref 0.0–3.0)
EOS PCT: 2.1 % (ref 0.0–5.0)
Eosinophils Absolute: 0.4 10*3/uL (ref 0.0–0.7)
HCT: 35.7 % — ABNORMAL LOW (ref 39.0–52.0)
HEMOGLOBIN: 11.5 g/dL — AB (ref 13.0–17.0)
Lymphocytes Relative: 9 % — ABNORMAL LOW (ref 12.0–46.0)
Lymphs Abs: 1.6 10*3/uL (ref 0.7–4.0)
MCHC: 32.3 g/dL (ref 30.0–36.0)
MCV: 92.7 fl (ref 78.0–100.0)
MONOS PCT: 7.6 % (ref 3.0–12.0)
Monocytes Absolute: 1.3 10*3/uL — ABNORMAL HIGH (ref 0.1–1.0)
Neutro Abs: 14 10*3/uL — ABNORMAL HIGH (ref 1.4–7.7)
Neutrophils Relative %: 81 % — ABNORMAL HIGH (ref 43.0–77.0)
Platelets: 301 10*3/uL (ref 150.0–400.0)
RBC: 3.85 Mil/uL — AB (ref 4.22–5.81)
RDW: 14 % (ref 11.5–15.5)
WBC: 17.3 10*3/uL — AB (ref 4.0–10.5)

## 2017-03-25 LAB — LIPASE: LIPASE: 54 U/L (ref 11.0–59.0)

## 2017-03-25 LAB — URINALYSIS, ROUTINE W REFLEX MICROSCOPIC
BILIRUBIN URINE: NEGATIVE
Hgb urine dipstick: NEGATIVE
KETONES UR: NEGATIVE
LEUKOCYTES UA: NEGATIVE
NITRITE: NEGATIVE
PH: 5.5 (ref 5.0–8.0)
RBC / HPF: NONE SEEN (ref 0–?)
SPECIFIC GRAVITY, URINE: 1.025 (ref 1.000–1.030)
TOTAL PROTEIN, URINE-UPE24: 30 — AB
Urine Glucose: NEGATIVE
Urobilinogen, UA: 0.2 (ref 0.0–1.0)
WBC, UA: NONE SEEN (ref 0–?)

## 2017-03-25 LAB — COMPREHENSIVE METABOLIC PANEL
ALBUMIN: 3.7 g/dL (ref 3.5–5.2)
ALK PHOS: 115 U/L (ref 39–117)
ALT: 37 U/L (ref 0–53)
AST: 33 U/L (ref 0–37)
BILIRUBIN TOTAL: 0.3 mg/dL (ref 0.2–1.2)
BUN: 77 mg/dL — AB (ref 6–23)
CO2: 26 mEq/L (ref 19–32)
Calcium: 9.1 mg/dL (ref 8.4–10.5)
Chloride: 99 mEq/L (ref 96–112)
Creatinine, Ser: 3.6 mg/dL — ABNORMAL HIGH (ref 0.40–1.50)
GFR: 17.29 mL/min — AB (ref >60.00)
Glucose, Bld: 151 mg/dL — ABNORMAL HIGH (ref 70–99)
POTASSIUM: 4.8 meq/L (ref 3.5–5.1)
SODIUM: 136 meq/L (ref 135–145)
TOTAL PROTEIN: 6.4 g/dL (ref 6.0–8.3)

## 2017-03-25 MED ORDER — TAMSULOSIN HCL 0.4 MG PO CAPS: ORAL_CAPSULE | ORAL | 3 refills | Status: DC

## 2017-03-25 MED ORDER — CARVEDILOL 12.5 MG PO TABS: 12.5000 mg | ORAL_TABLET | Freq: Two times a day (BID) | ORAL | 3 refills | Status: DC

## 2017-03-25 MED ORDER — NITROGLYCERIN 0.4 MG SL SUBL
0.4000 mg | SUBLINGUAL_TABLET | SUBLINGUAL | 3 refills | Status: DC | PRN
Start: 2017-03-25 — End: 2018-06-21

## 2017-03-25 MED ORDER — SERTRALINE HCL 100 MG PO TABS: 150.0000 mg | ORAL_TABLET | Freq: Every day | ORAL | 1 refills | Status: DC

## 2017-03-25 MED ORDER — ATORVASTATIN CALCIUM 40 MG PO TABS: 40.0000 mg | ORAL_TABLET | Freq: Every day | ORAL | 3 refills | Status: DC

## 2017-03-25 MED ORDER — BUPROPION HCL ER (XL) 150 MG PO TB24: 150.0000 mg | ORAL_TABLET | Freq: Every day | ORAL | 3 refills | Status: DC

## 2017-03-25 MED ORDER — CLOPIDOGREL BISULFATE 75 MG PO TABS: ORAL_TABLET | ORAL | 3 refills | Status: DC

## 2017-03-25 NOTE — Patient Instructions (Addendum)
Change of plans 1. Continue zoloft 150mg  (help set up a pill box for these) 2. Start wellbutrin 150mg  XL daily. Start tomorrow morning  Call behavioral health  Please stop by lab before you go  We will call you within a week or two about your referral for CT scan. If you do not hear within 3 weeks, give [place] a call.   Reduce lasix to 1 tablet twice a day- schedule follow up with me next week

## 2017-03-25 NOTE — Telephone Encounter (Signed)
Thanks

## 2017-03-25 NOTE — Progress Notes (Signed)
Subjective:  Rodney Farley is a 83 y.o. year old very pleasant male patient who presents for/with See problem oriented charting ROS- RUQ pain, some nausea. No fever. Admits to depressed mood, anhedonia. Some thoughts of self harm but fleeting and able to contract for safety.    Past Medical History-  Patient Active Problem List   Diagnosis Date Noted  . COPD (chronic obstructive pulmonary disease) ([place])     Priority: High  . Chronic systolic CHF (congestive heart failure) ([place]) 04/08/2015    Priority: High  . DOE (dyspnea on exertion), due to significant CAD 03/28/2015    Priority: High  . CKD (chronic kidney disease) stage 4, GFR 15-29 ml/min (HCC) 03/04/2015    Priority: High  . Coronary artery disease involving native coronary artery of native heart with angina pectoris ([place]) 09/13/2014    Priority: High  . Depression 08/05/2012    Priority: High  . Uncontrolled type 2 diabetes mellitus with stage 3 chronic kidney disease, with long-term current use of insulin ([place]) 08/05/2012    Priority: High  . Ischemic cardiomyopathy 11/03/2011    Priority: High  . Atrioventricular block, complete (HCC)     Priority: High  . Pacemaker     Priority: High  . BPH associated with nocturia 04/27/2016    Priority: Medium  . Diabetic peripheral neuropathy (HCC)     Priority: Medium  . Bell's palsy     Priority: Medium  . Gout 06/26/2015    Priority: Medium  . Sleep apnea 08/05/2012    Priority: Medium  . HTN (hypertension) 08/05/2012    Priority: Medium  . Diverticulitis 04/08/2015    Priority: Low  . GERD (gastroesophageal reflux disease) 08/07/2012    Priority: Low  . Obesity     Priority: Low  . Marital stress 02/18/2017  . Current use of long term anticoagulation - Plavix 06/10/2016  . Trigger finger 06/10/2016    Medications- reviewed and updated Current Outpatient Prescriptions  Medication Sig Dispense Refill  . aspirin EC 81 MG tablet Take 81 mg by mouth daily.     [name] atorvastatin (LIPITOR) 40 MG tablet Take 1 tablet (40 mg total) by mouth daily. 90 tablet 3  . Blood Glucose Monitoring Suppl (FREESTYLE LITE) DEVI     . carvedilol (COREG) 12.5 MG tablet Take 1 tablet (12.5 mg total) by mouth 2 (two) times daily with a meal. 60 tablet 11  . clopidogrel (PLAVIX) 75 MG tablet TAKE 1 TABLET BY MOUTH ONCE DAILY WITH BREAKFAST 30 tablet 2  . furosemide (LASIX) 40 MG tablet 2 lasix each morning and 1 in afternoon ('80mg'$  then '40mg'$ ). 270 tablet 1  . glucose blood (FREESTYLE TEST STRIPS) test strip Use as instructed 100 each 12  . glucose monitoring kit (FREESTYLE) monitoring kit USE TO MONITOR BLOOD GLUCOSE AS DIRECTED 1 each 1  . hydrALAZINE (APRESOLINE) 25 MG tablet TAKE ONE AND ONE-HALF BY MOUTH TWICE DAILY 90 tablet 1  . Insulin Glargine (LANTUS SOLOSTAR) 100 UNIT/ML Solostar Pen INJECT 60 UNITS UNDER THE SKIN ONCE DAILY 45 mL 2  . Insulin Pen Needle (UNIFINE PENTIPS) 31G X 5 MM MISC USE AS DIRECTED 100 each 0  . isosorbide mononitrate (IMDUR) 30 MG 24 hr tablet TAKE 1 TABLET BY MOUTH ONCE DAILY 90 tablet 3  . NITROSTAT 0.4 MG SL tablet PLACE 1 TABLET (0.4 MG TOTAL) UNDER THE TONGUE EVERY 5 (FIVE) MINUTES X 3 DOSES AS NEEDED FOR CHEST PAIN. 25 tablet 3  . OVER THE  COUNTER MEDICATION Place 1 drop into both eyes 3 (three) times daily. OTC Bausch-Lomb eye bath    . pantoprazole (PROTONIX) 40 MG tablet TAKE 1 TABLET BY MOUTH ONCE DAILY 90 tablet 3  . sertraline (ZOLOFT) 100 MG tablet TAKE ONE AND ONE-HALF TABLETS BY MOUTH ONCE DAILY 135 tablet 1  . tamsulosin (FLOMAX) 0.4 MG CAPS capsule TAKE 1 CAPSULE BY MOUTH DAILY AFTER SUPPER 30 capsule 11  . vitamin B-12 (CYANOCOBALAMIN) 1000 MCG tablet Take 1,000 mcg by mouth daily.     No current facility-administered medications for this visit.     Objective: BP 132/64 (BP Location: Left Arm, Patient Position: Sitting, Cuff Size: Large)   Pulse 70   Temp 98.3 F (36.8 C) (Oral)   Ht '5\' 7"'$  (1.702 m)   Wt 221 lb 12.8 oz  (100.6 kg)   SpO2 96%   BMI 34.74 kg/m  Gen: NAD, resting comfortably CV: RRR no murmurs rubs or gallops Lungs: CTAB no crackles, wheeze, rhonchi Abdomen: soft/very tender in RUQ but with mild diffuse tenderness/nondistended but obese/normal bowel sounds. No rebound or guarding.  Ext: no edema Skin: warm, dry  Assessment/Plan:  RUQ abdominal pain S: starting labor day weekend had a pain in his right side RUQ area and now moving towards umbilicus. Along with tthis has had mustard color liquid bowel movements. Last few days BM have moved back to brown stool - firmer but still soft. Needs to go every 4-5 hours. First started with nausea. Intermittent nausea. Pain worsened by physical movement. Seems to be cyclical. Son suggested cutting coffee and drinking more water. He has avoided coffee. Not worsened by eating. No treatments tried even tylenol or heating pad.  wobbly and disoriented at times last few days. Sleeping tremendous amount of the time nad keeping odd hours- that's pretty normal for him. Hard time creating a routine.  A/P: Labs today. Ordered CT with contrast but suspect outside protocol for contrast given his CKD. Pain RUQ- but no gallbladder. We discussed could be atypical presentation of appendicitis.   appendicitis On 03/26/17 I was called by radiology and patient has likely ruptured appendix with abscess. Reached patient and son for ED transport. WBC also came back elevated today over 17k.   Depression S:  since 6 weeks prior to wife departing into early august with severe depressed mood, anhedonia. Fleeting thoughts of self harm. Son states bigger issue is lack of self care-  Not eating, not exercising, not being hopeful.   Son lives 5 minutes away- his wife is close and can check in on him. Wife has bringing food over. For example today- warm milk with splenda only  Blood sugar was 160 on our check today.  Looking into sitter to come in to be with him in the daytime. Family  thinking about making a schedule.   Takes 1 pill of zoloft '100mg'$  some days and '200mg'$  other days.   A/P: phq9 of 25- one for SI. Able to contract for safet- would call son. Considered Change to lexapro '20mg'$  but not advisable at his creatinine level. Will continue zoloft but make consistently '150mg'$ . Also, add wellbutrin 150 mg XL (no seizure hx) once healed from acute abdominal illness. Depression predated abdominal pain- still suspect underlying severe depression but likely worsened symptoms from acute illness.   Also discussed importance of psychology/counseling visit. Will need both for improvement likely.   Future Appointments Date Time Provider [place]  03/30/2017 10:30 AM Brand Males, MD [facility] None  04/07/2017  10:20 AM [name], DO [facility] None  06/08/2017 2:40 PM [facility] DEVICE REMOTES [facility]   Orders Placed This Encounter  Procedures  . CT ABDOMEN PELVIS WO CONTRAST    SS. Hilda Blades [phone] xt [phone]/ HTA/ Diabetic on insulin/ will need labs  [phone] Call Report DR Yong Channel RT Upper Quad pain started 9/1. Nausea. Pain made worse by movement. PT has Elevated CRE ok to change to WO     Standing Status:   Future    Number of Occurrences:   1    Standing Expiration Date:   06/24/2018    Order Specific Question:   Preferred imaging location?    Answer:   [address]    Order Specific Question:   Radiology Contrast Protocol - do NOT remove file path    Answer:   [email or web address]  . CBC with Differential/Platelet    Standing Status:   Future    Number of Occurrences:   1    Standing Expiration Date:   03/25/2018  . Comprehensive metabolic panel    [facility]    Standing Status:   Future    Number of Occurrences:   1    Standing Expiration Date:   03/25/2018  . Lipase    Standing Status:   Future    Number of Occurrences:   1    Standing Expiration Date:   03/25/2018  . Urinalysis    Standing  Status:   Future    Number of Occurrences:   1    Standing Expiration Date:   03/25/2018  . Urinalysis, Routine w reflex microscopic    Meds ordered this encounter  Medications  . clopidogrel (PLAVIX) 75 MG tablet    Sig: TAKE 1 TABLET BY MOUTH ONCE DAILY WITH BREAKFAST    Dispense:  90 tablet    Refill:  3  . nitroGLYCERIN (NITROSTAT) 0.4 MG SL tablet    Sig: Place 1 tablet (0.4 mg total) under the tongue every 5 (five) minutes as needed for chest pain. 2 doses before calling for emergent help    Dispense:  25 tablet    Refill:  3  . tamsulosin (FLOMAX) 0.4 MG CAPS capsule    Sig: TAKE 1 CAPSULE BY MOUTH DAILY AFTER SUPPER    Dispense:  90 capsule    Refill:  3  . carvedilol (COREG) 12.5 MG tablet    Sig: Take 1 tablet (12.5 mg total) by mouth 2 (two) times daily with a meal.    Dispense:  180 tablet    Refill:  3  . atorvastatin (LIPITOR) 40 MG tablet    Sig: Take 1 tablet (40 mg total) by mouth daily.    Dispense:  90 tablet    Refill:  3  . buPROPion (WELLBUTRIN XL) 150 MG 24 hr tablet    Sig: Take 1 tablet (150 mg total) by mouth daily.    Dispense:  30 tablet    Refill:  3  . sertraline (ZOLOFT) 100 MG tablet    Sig: Take 1.5 tablets (150 mg total) by mouth daily.    Dispense:  135 tablet    Refill:  1   The duration of face-to-face time during this visit was greater than 40 minutes. Greater than 50% of this time was spent in counseling, explanation of diagnosis, planning of further management, and/or coordination of care including counseling about depression but also discussing concerns of abdominal pain/potential causes/future workup.     Return precautions  advised.  Garret Reddish, MD

## 2017-03-26 ENCOUNTER — Encounter (HOSPITAL_COMMUNITY): Payer: Self-pay

## 2017-03-26 ENCOUNTER — Inpatient Hospital Stay (HOSPITAL_COMMUNITY)
Admission: EM | Admit: 2017-03-26 | Discharge: 2017-04-06 | DRG: 329 | Disposition: A | Discharge status: Skilled Nursing Facility | Payer: PPO | Attending: Pulmonary Disease | Admitting: Pulmonary Disease

## 2017-03-26 ENCOUNTER — Ambulatory Visit (INDEPENDENT_AMBULATORY_CARE_PROVIDER_SITE_OTHER)
Admission: RE | Admit: 2017-03-26 | Discharge: 2017-03-26 | Disposition: A | Discharge status: Home / Self Care | Payer: PPO | Source: Ambulatory Visit | Attending: Family Medicine | Admitting: Family Medicine

## 2017-03-26 DIAGNOSIS — J449 Chronic obstructive pulmonary disease, unspecified: Secondary | ICD-10-CM | POA: Diagnosis present

## 2017-03-26 DIAGNOSIS — K439 Ventral hernia without obstruction or gangrene: Secondary | ICD-10-CM | POA: Diagnosis not present

## 2017-03-26 DIAGNOSIS — I2510 Atherosclerotic heart disease of native coronary artery without angina pectoris: Secondary | ICD-10-CM | POA: Diagnosis not present

## 2017-03-26 DIAGNOSIS — K219 Gastro-esophageal reflux disease without esophagitis: Secondary | ICD-10-CM | POA: Diagnosis present

## 2017-03-26 DIAGNOSIS — K353 Acute appendicitis with localized peritonitis: Secondary | ICD-10-CM | POA: Diagnosis not present

## 2017-03-26 DIAGNOSIS — G4733 Obstructive sleep apnea (adult) (pediatric): Secondary | ICD-10-CM | POA: Diagnosis present

## 2017-03-26 DIAGNOSIS — R1011 Right upper quadrant pain: Secondary | ICD-10-CM | POA: Diagnosis not present

## 2017-03-26 DIAGNOSIS — Z7982 Long term (current) use of aspirin: Secondary | ICD-10-CM

## 2017-03-26 DIAGNOSIS — K651 Peritoneal abscess: Secondary | ICD-10-CM

## 2017-03-26 DIAGNOSIS — Z8249 Family history of ischemic heart disease and other diseases of the circulatory system: Secondary | ICD-10-CM

## 2017-03-26 DIAGNOSIS — N179 Acute kidney failure, unspecified: Secondary | ICD-10-CM | POA: Diagnosis not present

## 2017-03-26 DIAGNOSIS — Z7902 Long term (current) use of antithrombotics/antiplatelets: Secondary | ICD-10-CM

## 2017-03-26 DIAGNOSIS — Z950 Presence of cardiac pacemaker: Secondary | ICD-10-CM

## 2017-03-26 DIAGNOSIS — Z6833 Body mass index (BMI) 33.0-33.9, adult: Secondary | ICD-10-CM

## 2017-03-26 DIAGNOSIS — E875 Hyperkalemia: Secondary | ICD-10-CM | POA: Diagnosis present

## 2017-03-26 DIAGNOSIS — I252 Old myocardial infarction: Secondary | ICD-10-CM | POA: Diagnosis not present

## 2017-03-26 DIAGNOSIS — R2689 Other abnormalities of gait and mobility: Secondary | ICD-10-CM | POA: Diagnosis not present

## 2017-03-26 DIAGNOSIS — E870 Hyperosmolality and hypernatremia: Secondary | ICD-10-CM | POA: Diagnosis present

## 2017-03-26 DIAGNOSIS — K3580 Unspecified acute appendicitis: Secondary | ICD-10-CM | POA: Diagnosis not present

## 2017-03-26 DIAGNOSIS — Z888 Allergy status to other drugs, medicaments and biological substances status: Secondary | ICD-10-CM | POA: Diagnosis not present

## 2017-03-26 DIAGNOSIS — R100 Acute abdomen: Secondary | ICD-10-CM | POA: Diagnosis not present

## 2017-03-26 DIAGNOSIS — R34 Anuria and oliguria: Secondary | ICD-10-CM

## 2017-03-26 DIAGNOSIS — N281 Cyst of kidney, acquired: Secondary | ICD-10-CM | POA: Diagnosis not present

## 2017-03-26 DIAGNOSIS — R278 Other lack of coordination: Secondary | ICD-10-CM | POA: Diagnosis not present

## 2017-03-26 DIAGNOSIS — J9811 Atelectasis: Secondary | ICD-10-CM | POA: Diagnosis not present

## 2017-03-26 DIAGNOSIS — E669 Obesity, unspecified: Secondary | ICD-10-CM | POA: Diagnosis not present

## 2017-03-26 DIAGNOSIS — N170 Acute kidney failure with tubular necrosis: Secondary | ICD-10-CM | POA: Diagnosis not present

## 2017-03-26 DIAGNOSIS — Z91030 Bee allergy status: Secondary | ICD-10-CM

## 2017-03-26 DIAGNOSIS — E1122 Type 2 diabetes mellitus with diabetic chronic kidney disease: Secondary | ICD-10-CM | POA: Diagnosis present

## 2017-03-26 DIAGNOSIS — K3533 Acute appendicitis with perforation and localized peritonitis, with abscess: Secondary | ICD-10-CM

## 2017-03-26 DIAGNOSIS — I509 Heart failure, unspecified: Secondary | ICD-10-CM | POA: Diagnosis not present

## 2017-03-26 DIAGNOSIS — I255 Ischemic cardiomyopathy: Secondary | ICD-10-CM | POA: Diagnosis present

## 2017-03-26 DIAGNOSIS — K352 Acute appendicitis with generalized peritonitis: Secondary | ICD-10-CM | POA: Diagnosis not present

## 2017-03-26 DIAGNOSIS — I110 Hypertensive heart disease with heart failure: Secondary | ICD-10-CM | POA: Diagnosis not present

## 2017-03-26 DIAGNOSIS — M6281 Muscle weakness (generalized): Secondary | ICD-10-CM | POA: Diagnosis not present

## 2017-03-26 DIAGNOSIS — I959 Hypotension, unspecified: Secondary | ICD-10-CM | POA: Diagnosis not present

## 2017-03-26 DIAGNOSIS — K3532 Acute appendicitis with perforation and localized peritonitis, without abscess: Secondary | ICD-10-CM | POA: Diagnosis present

## 2017-03-26 DIAGNOSIS — I5042 Chronic combined systolic (congestive) and diastolic (congestive) heart failure: Secondary | ICD-10-CM | POA: Diagnosis present

## 2017-03-26 DIAGNOSIS — I25119 Atherosclerotic heart disease of native coronary artery with unspecified angina pectoris: Secondary | ICD-10-CM | POA: Diagnosis not present

## 2017-03-26 DIAGNOSIS — I130 Hypertensive heart and chronic kidney disease with heart failure and stage 1 through stage 4 chronic kidney disease, or unspecified chronic kidney disease: Secondary | ICD-10-CM | POA: Diagnosis not present

## 2017-03-26 DIAGNOSIS — N184 Chronic kidney disease, stage 4 (severe): Secondary | ICD-10-CM | POA: Diagnosis not present

## 2017-03-26 DIAGNOSIS — G4730 Sleep apnea, unspecified: Secondary | ICD-10-CM | POA: Diagnosis not present

## 2017-03-26 DIAGNOSIS — D649 Anemia, unspecified: Secondary | ICD-10-CM | POA: Diagnosis present

## 2017-03-26 DIAGNOSIS — K65 Peritonitis: Secondary | ICD-10-CM | POA: Diagnosis not present

## 2017-03-26 DIAGNOSIS — R1312 Dysphagia, oropharyngeal phase: Secondary | ICD-10-CM | POA: Diagnosis not present

## 2017-03-26 DIAGNOSIS — K36 Other appendicitis: Secondary | ICD-10-CM | POA: Diagnosis not present

## 2017-03-26 DIAGNOSIS — Z79899 Other long term (current) drug therapy: Secondary | ICD-10-CM

## 2017-03-26 DIAGNOSIS — Z794 Long term (current) use of insulin: Secondary | ICD-10-CM | POA: Diagnosis not present

## 2017-03-26 DIAGNOSIS — Z87891 Personal history of nicotine dependence: Secondary | ICD-10-CM | POA: Diagnosis not present

## 2017-03-26 DIAGNOSIS — E872 Acidosis: Secondary | ICD-10-CM | POA: Diagnosis present

## 2017-03-26 DIAGNOSIS — I5022 Chronic systolic (congestive) heart failure: Secondary | ICD-10-CM | POA: Diagnosis not present

## 2017-03-26 DIAGNOSIS — I10 Essential (primary) hypertension: Secondary | ICD-10-CM | POA: Diagnosis not present

## 2017-03-26 DIAGNOSIS — F329 Major depressive disorder, single episode, unspecified: Secondary | ICD-10-CM | POA: Diagnosis not present

## 2017-03-26 DIAGNOSIS — H5462 Unqualified visual loss, left eye, normal vision right eye: Secondary | ICD-10-CM | POA: Diagnosis present

## 2017-03-26 DIAGNOSIS — Z955 Presence of coronary angioplasty implant and graft: Secondary | ICD-10-CM

## 2017-03-26 DIAGNOSIS — E1142 Type 2 diabetes mellitus with diabetic polyneuropathy: Secondary | ICD-10-CM | POA: Diagnosis present

## 2017-03-26 DIAGNOSIS — R9431 Abnormal electrocardiogram [ECG] [EKG]: Secondary | ICD-10-CM | POA: Diagnosis not present

## 2017-03-26 DIAGNOSIS — Z8673 Personal history of transient ischemic attack (TIA), and cerebral infarction without residual deficits: Secondary | ICD-10-CM

## 2017-03-26 DIAGNOSIS — E785 Hyperlipidemia, unspecified: Secondary | ICD-10-CM | POA: Diagnosis present

## 2017-03-26 DIAGNOSIS — N400 Enlarged prostate without lower urinary tract symptoms: Secondary | ICD-10-CM | POA: Diagnosis not present

## 2017-03-26 LAB — URINALYSIS, ROUTINE W REFLEX MICROSCOPIC
Bilirubin Urine: NEGATIVE
GLUCOSE, UA: NEGATIVE mg/dL
Hgb urine dipstick: NEGATIVE
Ketones, ur: NEGATIVE mg/dL
Leukocytes, UA: NEGATIVE
NITRITE: NEGATIVE
PH: 5 (ref 5.0–8.0)
Protein, ur: 100 mg/dL — AB
Specific Gravity, Urine: 1.013 (ref 1.005–1.030)

## 2017-03-26 LAB — COMPREHENSIVE METABOLIC PANEL
ALBUMIN: 3.4 g/dL — AB (ref 3.5–5.0)
ALK PHOS: 114 U/L (ref 38–126)
ALT: 34 U/L (ref 17–63)
ANION GAP: 13 (ref 5–15)
AST: 22 U/L (ref 15–41)
BILIRUBIN TOTAL: 0.7 mg/dL (ref 0.3–1.2)
BUN: 79 mg/dL — AB (ref 6–20)
CALCIUM: 9.2 mg/dL (ref 8.9–10.3)
CO2: 22 mmol/L (ref 22–32)
CREATININE: 3.77 mg/dL — AB (ref 0.61–1.24)
Chloride: 100 mmol/L — ABNORMAL LOW (ref 101–111)
GFR calc Af Amer: 16 mL/min — ABNORMAL LOW (ref >60)
GFR calc non Af Amer: 14 mL/min — ABNORMAL LOW (ref >60)
GLUCOSE: 143 mg/dL — AB (ref 65–99)
Potassium: 4.8 mmol/L (ref 3.5–5.1)
Sodium: 135 mmol/L (ref 135–145)
TOTAL PROTEIN: 7.2 g/dL (ref 6.5–8.1)

## 2017-03-26 LAB — CBC
HCT: 35.8 % — ABNORMAL LOW (ref 39.0–52.0)
Hemoglobin: 12 g/dL — ABNORMAL LOW (ref 13.0–17.0)
MCH: 30.3 pg (ref 26.0–34.0)
MCHC: 33.5 g/dL (ref 30.0–36.0)
MCV: 90.4 fL (ref 78.0–100.0)
PLATELETS: 306 10*3/uL (ref 150–400)
RBC: 3.96 MIL/uL — ABNORMAL LOW (ref 4.22–5.81)
RDW: 13.6 % (ref 11.5–15.5)
WBC: 13.2 10*3/uL — ABNORMAL HIGH (ref 4.0–10.5)

## 2017-03-26 LAB — I-STAT CG4 LACTIC ACID, ED: Lactic Acid, Venous: 0.92 mmol/L (ref 0.5–1.9)

## 2017-03-26 LAB — LIPASE, BLOOD: Lipase: 32 U/L (ref 11–51)

## 2017-03-26 LAB — GLUCOSE, CAPILLARY: Glucose-Capillary: 130 mg/dL — ABNORMAL HIGH (ref 65–99)

## 2017-03-26 MED ORDER — FENTANYL CITRATE (PF) 100 MCG/2ML IJ SOLN
50.0000 ug | Freq: Once | INTRAMUSCULAR | Status: AC
  Administered 2017-03-26: 50 ug via INTRAVENOUS
  Filled 2017-03-26: qty 2

## 2017-03-26 MED ORDER — PROCHLORPERAZINE EDISYLATE 5 MG/ML IJ SOLN
2.5000 mg | Freq: Four times a day (QID) | INTRAMUSCULAR | Status: DC | PRN
  Administered 2017-04-01: 2.5 mg via INTRAVENOUS
  Filled 2017-03-26: qty 0.5
  Filled 2017-03-26: qty 2
  Filled 2017-03-26: qty 0.5

## 2017-03-26 MED ORDER — PANTOPRAZOLE SODIUM 40 MG PO TBEC
40.0000 mg | DELAYED_RELEASE_TABLET | Freq: Every day | ORAL | Status: DC
  Administered 2017-03-27: 40 mg via ORAL
  Filled 2017-03-26: qty 1

## 2017-03-26 MED ORDER — ISOSORBIDE MONONITRATE ER 30 MG PO TB24
30.0000 mg | ORAL_TABLET | Freq: Every day | ORAL | Status: DC
  Administered 2017-03-27: 30 mg via ORAL
  Filled 2017-03-26: qty 1

## 2017-03-26 MED ORDER — CARVEDILOL 12.5 MG PO TABS
12.5000 mg | ORAL_TABLET | Freq: Two times a day (BID) | ORAL | Status: DC
  Administered 2017-03-27: 12.5 mg via ORAL
  Filled 2017-03-26 (×2): qty 1

## 2017-03-26 MED ORDER — FENTANYL CITRATE (PF) 100 MCG/2ML IJ SOLN
INTRAMUSCULAR | Status: AC
  Administered 2017-03-26: 50 ug
  Filled 2017-03-26: qty 2

## 2017-03-26 MED ORDER — ONDANSETRON HCL 4 MG/2ML IJ SOLN
INTRAMUSCULAR | Status: AC
  Administered 2017-03-26: 4 mg
  Filled 2017-03-26: qty 2

## 2017-03-26 MED ORDER — INSULIN GLARGINE 100 UNIT/ML SOLOSTAR PEN: 10.0000 [IU] | PEN_INJECTOR | Freq: Every day | SUBCUTANEOUS | Status: DC

## 2017-03-26 MED ORDER — POLYVINYL ALCOHOL 1.4 % OP SOLN
1.0000 [drp] | Freq: Three times a day (TID) | OPHTHALMIC | Status: DC
  Administered 2017-03-26 – 2017-04-06 (×27): 1 [drp] via OPHTHALMIC
  Filled 2017-03-26 (×3): qty 15

## 2017-03-26 MED ORDER — INSULIN ASPART 100 UNIT/ML ~~LOC~~ SOLN
0.0000 [IU] | SUBCUTANEOUS | Status: DC
  Administered 2017-03-26 – 2017-03-31 (×4): 1 [IU] via SUBCUTANEOUS
  Administered 2017-03-31: 3 [IU] via SUBCUTANEOUS
  Administered 2017-03-31: 1 [IU] via SUBCUTANEOUS
  Administered 2017-03-31: 2 [IU] via SUBCUTANEOUS
  Administered 2017-03-31: 1 [IU] via SUBCUTANEOUS
  Administered 2017-03-31 – 2017-04-01 (×6): 2 [IU] via SUBCUTANEOUS

## 2017-03-26 MED ORDER — SODIUM CHLORIDE 0.9 % IV BOLUS (SEPSIS)
500.0000 mL | Freq: Once | INTRAVENOUS | Status: AC
  Administered 2017-03-26: 500 mL via INTRAVENOUS

## 2017-03-26 MED ORDER — HYDROMORPHONE HCL 1 MG/ML IJ SOLN
0.5000 mg | INTRAMUSCULAR | Status: DC | PRN
  Administered 2017-03-26 (×2): 0.5 mg via INTRAVENOUS
  Filled 2017-03-26: qty 0.5
  Filled 2017-03-26: qty 1

## 2017-03-26 MED ORDER — PROCHLORPERAZINE EDISYLATE 5 MG/ML IJ SOLN
2.5000 mg | Freq: Once | INTRAMUSCULAR | Status: AC
  Administered 2017-03-26: 2.5 mg via INTRAVENOUS
  Filled 2017-03-26: qty 2

## 2017-03-26 MED ORDER — POLYETHYLENE GLYCOL 400 0.25 % OP GEL: 1.0000 [drp] | Freq: Every day | OPHTHALMIC | Status: DC

## 2017-03-26 MED ORDER — ORAL CARE MOUTH RINSE
15.0000 mL | Freq: Two times a day (BID) | OROMUCOSAL | Status: DC
  Administered 2017-03-27 – 2017-04-02 (×10): 15 mL via OROMUCOSAL

## 2017-03-26 MED ORDER — SODIUM CHLORIDE 0.9 % IV SOLN
INTRAVENOUS | Status: DC
  Administered 2017-03-26: 19:00:00 via INTRAVENOUS

## 2017-03-26 MED ORDER — SERTRALINE HCL 50 MG PO TABS
150.0000 mg | ORAL_TABLET | Freq: Every day | ORAL | Status: DC
  Administered 2017-03-28 – 2017-04-05 (×9): 150 mg via ORAL
  Filled 2017-03-26 (×9): qty 1

## 2017-03-26 MED ORDER — TAMSULOSIN HCL 0.4 MG PO CAPS
0.4000 mg | ORAL_CAPSULE | Freq: Every day | ORAL | Status: DC
  Administered 2017-03-29 – 2017-04-05 (×8): 0.4 mg via ORAL
  Filled 2017-03-26 (×8): qty 1

## 2017-03-26 MED ORDER — INSULIN GLARGINE 100 UNIT/ML ~~LOC~~ SOLN
10.0000 [IU] | Freq: Every day | SUBCUTANEOUS | Status: DC
  Administered 2017-03-26 – 2017-03-28 (×2): 10 [IU] via SUBCUTANEOUS
  Filled 2017-03-26 (×4): qty 0.1

## 2017-03-26 MED ORDER — PIPERACILLIN-TAZOBACTAM 3.375 G IVPB 30 MIN
3.3750 g | Freq: Once | INTRAVENOUS | Status: AC
  Administered 2017-03-26: 3.375 g via INTRAVENOUS
  Filled 2017-03-26: qty 50

## 2017-03-26 MED ORDER — POLYETHYLENE GLYCOL 400 0.25 % OP SOLN: 1.0000 [drp] | Freq: Three times a day (TID) | OPHTHALMIC | Status: DC

## 2017-03-26 MED ORDER — ASPIRIN EC 81 MG PO TBEC
81.0000 mg | DELAYED_RELEASE_TABLET | Freq: Every day | ORAL | Status: DC
  Filled 2017-03-26: qty 1

## 2017-03-26 MED ORDER — HYDROMORPHONE HCL 1 MG/ML IJ SOLN
0.5000 mg | INTRAMUSCULAR | Status: DC | PRN
  Administered 2017-03-26 – 2017-03-27 (×2): 0.5 mg via INTRAVENOUS
  Filled 2017-03-26 (×2): qty 0.5

## 2017-03-26 MED ORDER — PIPERACILLIN-TAZOBACTAM IN DEX 2-0.25 GM/50ML IV SOLN
2.2500 g | Freq: Three times a day (TID) | INTRAVENOUS | Status: DC
  Administered 2017-03-26 – 2017-03-27 (×2): 2.25 g via INTRAVENOUS
  Filled 2017-03-26 (×3): qty 50

## 2017-03-26 MED ORDER — ATORVASTATIN CALCIUM 40 MG PO TABS
40.0000 mg | ORAL_TABLET | Freq: Every day | ORAL | Status: DC
  Administered 2017-03-28 – 2017-04-05 (×9): 40 mg via ORAL
  Filled 2017-03-26 (×9): qty 1

## 2017-03-26 NOTE — Assessment & Plan Note (Addendum)
S:  since 6 weeks prior to wife departing into early august with severe depressed mood, anhedonia. Fleeting thoughts of self harm. Son states bigger issue is lack of self care-  Not eating, not exercising, not being hopeful.   Son lives 5 minutes away- his wife is close and can check in on him. Wife has bringing food over. For example today- warm milk with splenda only  Blood sugar was 160 on our check today.  Looking into sitter to come in to be with him in the daytime. Family thinking about making a schedule.   Takes 1 pill of zoloft 100mg  some days and 200mg  other days.   A/P: phq9 of 25- one for SI. Able to contract for safet- would call son. Considered Change to lexapro 20mg  but not advisable at his creatinine level. Will continue zoloft but make consistently 150mg . Also, add wellbutrin 150 mg XL (no seizure hx) once healed from acute abdominal illness. Depression predated abdominal pain- still suspect underlying severe depression but likely worsened symptoms from acute illness.   Also discussed importance of psychology/counseling visit. Will need both for improvement likely.

## 2017-03-26 NOTE — ED Triage Notes (Signed)
Pt presents to the ed with a confirmed ruptured appendix after experiencing abdominal pain x 14 days and going to his doctor this am. He had a ct scan done showing his appendix is ruptured

## 2017-03-26 NOTE — Consult Note (Signed)
[facility] Surgery Consult Note  Rueben Farley 10-24-1931  [id number].    Requesting MD: Tegeler Chief Complaint/Reason for Consult: Ruptured appendicitis  HPI:  Rodney Farley is an 83 yo male who presented to [facility] earlier today with 2 weeks of abdominal pain. Son at bedside assisted with history taking. Patient states that he began having mild intermittent RLQ pain 2 weeks ago associated with diarrhea. This week his pain began gradually getting worse and was associated with nausea and vomiting. He was seen by his PCP who ordered labs and a CT scan; he was found to have a leukocytosis, AKI, and on CT scan an 8.3x5.6cm possible abscess secondary to perforated appendicitis. He was advised to come to the ED. States that now his pain is in the RLQ, constant, and severe. Worse with movement and palpation. He has had nothing to eat today.  PMH significant for DM, HTN, HLD, GERD, CAD, COPD, CKD-3, S/p pacemaker placemen, CHF (ECHO 01/2006 EF 60-65%) Abdominal surgical history: hiatal hernia repair, open cholecystectomy Anticoagulants: plavix Lives at home alone. Ambulates with a cane.  ROS: Review of Systems  Constitutional: Positive for chills. Negative for fever.  HENT: Positive for hearing loss.   Eyes: Negative.   Respiratory: Negative.   Cardiovascular: Negative.   Gastrointestinal: Positive for abdominal pain, diarrhea, nausea and vomiting. Negative for constipation and heartburn.  Genitourinary: Negative.   Musculoskeletal: Negative.   Skin: Negative.   Neurological: Negative.   All systems reviewed and otherwise negative except for as above  Family History  Problem Relation Age of Onset  . Stroke Mother   . Leukemia Father   . Stroke Sister   . Heart attack Brother     Past Medical History:  Diagnosis Date  . Anginal pain ([place])   . Anxiety   . Arm pain 05/08/2015   LEFT ARM  . Atrioventricular block, complete ([place])    a. 2010 s/p pacemaker.  .  Bell's palsy    left side. after shingles episode  . Chronic systolic CHF (congestive heart failure) ([place])    a. LVEF 35-40% by echo 09/2014.  [name] CKD (chronic kidney disease), stage IV ([place])   . COPD (chronic obstructive pulmonary disease) ([facility])    Severe  . Coronary artery disease    a. s/p MI in 1994/1995 while in [place] s/p questionable PCI. 03/2015: progression of disease, for staged PCI.  [name] Depression   . Diabetic peripheral neuropathy ([name])   . DOE (dyspnea on exertion) 03/28/2015  . GERD (gastroesophageal reflux disease)   . Hypercholesterolemia   . Hypertension   . MI (myocardial infarction) ([place]) 1994; 1995  . Neuropathy    IN LOWER EXTREMITIES  . Obesity   . Pacemaker    medtronic>>> MDT ICD 09/23/15  . Sleep apnea    "sleeps w/humidifyer when he panics and gets short of breath" (04/08/2015)  . TIA (transient ischemic attack) X 3  . Type II diabetes mellitus ([place])     Past Surgical History:  Procedure Laterality Date  . CARDIAC CATHETERIZATION N/A 03/29/2015   Procedure: Right/Left Heart Cath and Coronary Angiography;  Surgeon: Dvontae M [name], MD;  Location: [facility];  Service: Cardiovascular;  Laterality: N/A;  . [place]   "after my MI; put me on heart RX after cath"  . CARDIAC CATHETERIZATION N/A 04/09/2015   Procedure: Coronary Stent Intervention;  Surgeon: Crimson M [name], MD;  Location: [facility];  Service: Cardiovascular;  Laterality: N/A;  .  CHOLECYSTECTOMY OPEN  ~ 1977  . EP IMPLANTABLE DEVICE N/A 09/23/2015   MDT CRT-D, Dr. Caryl Comes  . HIATAL HERNIA REPAIR  1977  . INSERT / REPLACE / REMOVE PACEMAKER  07/2008   Complete heart block status post DDD with good function  . TONSILLECTOMY      Social History:  reports that he quit smoking about 9 years ago. His smoking use included Cigarettes. He has a 81.00 pack-year smoking history. He has never used smokeless tobacco. He reports that he does not drink alcohol or use  drugs.  Allergies:  Allergies  Allergen Reactions  . Bee Venom Anaphylaxis  . Lyrica [Pregabalin] Other (See Comments)    hallucinations  . Prednisone Other (See Comments)    hallucinations  . Zocor [Simvastatin] Nausea Only and Other (See Comments)    Headache with brand name only.  Can take the generic.     (Not in a hospital admission)  Prior to Admission medications   Medication Sig Start Date End Date Taking? Authorizing Provider  aspirin EC 81 MG tablet Take 81 mg by mouth daily.   Yes [provider]  atorvastatin (LIPITOR) 40 MG tablet Take 1 tablet (40 mg total) by mouth daily. Patient taking differently: Take 40 mg by mouth at bedtime.  03/25/17  Yes [name], MD  carvedilol (COREG) 12.5 MG tablet Take 1 tablet (12.5 mg total) by mouth 2 (two) times daily with a meal. 03/25/17  Yes [name], MD  clopidogrel (PLAVIX) 75 MG tablet TAKE 1 TABLET BY MOUTH ONCE DAILY WITH BREAKFAST Patient taking differently: Take 75 mg by mouth daily with breakfast.  03/25/17  Yes [name], MD  furosemide (LASIX) 40 MG tablet 2 lasix each morning and 1 in afternoon (59m then 413m. Patient taking differently: Take 40-80 mg by mouth See admin instructions. Take 2 tablets (80 mg) by mouth every morning and 1 tablet (40 mg) in the afternoon 02/02/17  Yes HuMarin OlpMD  hydrALAZINE (APRESOLINE) 25 MG tablet TAKE ONE AND ONE-HALF BY MOUTH TWICE DAILY Patient taking differently: TAKE ONE AND ONE-HALF TABLETS (37.5 MG) BY MOUTH TWICE DAILY 03/18/17  Yes JoMartiniquePeter M, MD  Insulin Glargine (LANTUS SOLOSTAR) 100 UNIT/ML Solostar Pen INJECT 60 UNITS UNDER THE SKIN ONCE DAILY Patient taking differently: Inject 60 Units into the skin daily at 10 pm.  09/07/16  Yes HuMarin OlpMD  isosorbide mononitrate (IMDUR) 30 MG 24 hr tablet TAKE 1 TABLET BY MOUTH ONCE DAILY Patient taking differently: TAKE 1 TABLET (30 MG) BY MOUTH ONCE DAILY 03/08/17  Yes JoMartiniquePeter M,  MD  nitroGLYCERIN (NITROSTAT) 0.4 MG SL tablet Place 1 tablet (0.4 mg total) under the tongue every 5 (five) minutes as needed for chest pain. 2 doses before calling for emergent help 03/25/17  Yes HuMarin OlpMD  pantoprazole (PROTONIX) 40 MG tablet TAKE 1 TABLET BY MOUTH ONCE DAILY Patient taking differently: TAKE 1 TABLET (40 MG) BY MOUTH ONCE DAILY 03/08/17  Yes JoMartiniquePeter M, MD  Polyethylene Glycol 400 (BLINK TEARS) 0.25 % GEL Place 1 drop into both eyes at bedtime.   Yes [provider]  Polyethylene Glycol 400 (BLINK TEARS) 0.25 % SOLN Place 1 drop into both eyes 3 (three) times daily.   Yes [provider]  sertraline (ZOLOFT) 100 MG tablet Take 1.5 tablets (150 mg total) by mouth daily. 03/25/17  Yes HuMarin OlpMD  tamsulosin (FLOMAX) 0.4 MG CAPS capsule TAKE  1 CAPSULE BY MOUTH DAILY AFTER SUPPER Patient taking differently: Take 0.4 mg by mouth daily after supper.  03/25/17  Yes [name], MD  vitamin B-12 (CYANOCOBALAMIN) 1000 MCG tablet Take 1,000 mcg by mouth daily.   Yes [provider]  Blood Glucose Monitoring Suppl (FREESTYLE LITE) DEVI  02/04/17   [provider]  buPROPion (WELLBUTRIN XL) 150 MG 24 hr tablet Take 1 tablet (150 mg total) by mouth daily. 03/25/17   [name], MD  glucose blood (FREESTYLE TEST STRIPS) test strip Use as instructed 02/04/17   [name], MD  glucose monitoring kit (FREESTYLE) monitoring kit USE TO MONITOR BLOOD GLUCOSE AS DIRECTED 02/04/17   [name], MD  Insulin Pen Needle (UNIFINE PENTIPS) 31G X 5 MM MISC USE AS DIRECTED 01/28/17   [name], MD    Blood pressure 128/66, pulse 78, temperature 98.1 F (36.7 C), temperature source Oral, resp. rate 18, height 5' 7" (1.702 m), weight 221 lb (100.2 kg), SpO2 95 %. Physical Exam: General: pleasant, WD/WN white male who is laying in bed in NAD but appears very uncomfortable HEENT: head is normocephalic, atraumatic.   Sclera are noninjected.  Pupils equal and round.  Ears and nose without any masses or lesions.  Mouth is pink and moist. Edentate.  Heart: regular, rate, and rhythm.  No obvious murmurs, gallops, or rubs noted.  Palpable pedal pulses bilaterally Lungs: CTAB, no wheezes, rhonchi, or rales noted.  Respiratory effort nonlabored Abd: well healed midline incision with soft reducible hernia at proximal aspect, soft, mild distension, +BS, no masses or organomegaly. +TTP RLQ with no rebound or guarding MS: all 4 extremities are symmetrical with no cyanosis, clubbing, or edema. Skin: warm and dry with no masses, lesions, or rashes Psych: A&Ox3 with an appropriate affect. Neuro: cranial nerves grossly intact, extremity CSM intact bilaterally, normal speech  Results for orders placed or performed during the hospital encounter of 03/26/17 (from the past 48 hour(s))  CBC     Status: Abnormal   Collection Time: 03/26/17  3:12 PM  Result Value Ref Range   WBC 13.2 (H) 4.0 - 10.5 K/uL   RBC 3.96 (L) 4.22 - 5.81 MIL/uL   Hemoglobin 12.0 (L) 13.0 - 17.0 g/dL   HCT 35.8 (L) 39.0 - 52.0 %   MCV 90.4 78.0 - 100.0 fL   MCH 30.3 26.0 - 34.0 pg   MCHC 33.5 30.0 - 36.0 g/dL   RDW 13.6 11.5 - 15.5 %   Platelets 306 150 - 400 K/uL  I-Stat CG4 Lactic Acid, ED     Status: None   Collection Time: 03/26/17  3:31 PM  Result Value Ref Range   Lactic Acid, Venous 0.92 0.5 - 1.9 mmol/L   Ct Abdomen Pelvis Wo Contrast  Result Date: 03/26/2017 CLINICAL DATA:  Acute right-sided abdominal pain. EXAM: CT ABDOMEN AND PELVIS WITHOUT CONTRAST TECHNIQUE: Multidetector CT imaging of the abdomen and pelvis was performed following the standard protocol without IV contrast. COMPARISON:  None. FINDINGS: Lower chest: No acute abnormality. Hepatobiliary: No focal liver abnormality is seen. Status post cholecystectomy. No biliary dilatation. Pancreas: Unremarkable. No pancreatic ductal dilatation or surrounding inflammatory changes.  Spleen: Normal in size without focal abnormality. Adrenals/Urinary Tract: Adrenal glands appear normal. Bilateral renal cysts are noted, some of which are hyperdense in appearance. No hydronephrosis or renal obstruction is noted. No renal or ureteral calculi are noted. Urinary bladder is unremarkable. Stomach/Bowel: The stomach appears normal. There is no  evidence of bowel obstruction. There is a large rounded abnormality seen adjacent to the inferior portion of the cecum that measures 8.3 x 5.6 cm. Mild surrounding inflammation is noted. This is concerning for possible abscess or phlegmon status post ruptured appendicitis. There appears to be an enlarged and inflamed appendix in this area, although it is difficult to be certain given the degree of inflammation in the area. Significant wall thickening of the terminal ileum is noted which may be secondary to the inflammation. Vascular/Lymphatic: Aortic atherosclerosis. No enlarged abdominal or pelvic lymph nodes. Reproductive: Mild prostatic enlargement is noted. Other: No abdominal wall hernia or abnormality. No abdominopelvic ascites. Musculoskeletal: No acute or significant osseous findings. IMPRESSION: 8.3 x 5.6 cm rounded complex abnormality seen adjacent to inferior portion of this cecum with mild surrounding inflammation. This is concerning for possible abscess or phlegmon related to ruptured appendicitis. Possible enlarged and inflamed appendix may be seen in this area, although it is difficult to be certain given the degree of inflammation present. Significant wall thickening of the terminal ileum is noted most likely secondary to adjacent inflammation. Critical Value/emergent results were called by telephone at the time of interpretation on 03/26/2017 at 12:40 pm to Dr. Garret Reddish , who verbally acknowledged these results. Aortic atherosclerosis. Mild prostatic enlargement. Multiple exophytic cysts are seen involving both kidneys, several which appear to  be hyperdense and therefore most likely complex cysts. Ultrasound is recommended to rule out potential masses. Electronically Signed   By: Marijo Conception, M.D.   On: 03/26/2017 12:41      Assessment/Plan HTN HLD GERD DM COPD CKD-3 CHF (ECHO 01/2006 EF 60-65%) CAD - on plavix (last dose 9/14) S/p pacemaker placemen Depression  Ruptured appendicitis with abscess - abdominal surgical history: hiatal hernia repair, open cholecystectomy - 2 weeks of intermittent followed by gradually worsening RLQ pain with nausea, vomiting, and diarrhea - CT scan shows an 8.3x5.6cm possible abscess secondary to perforated appendicitis - WBC 13.2, afebrile  Plan - Admit to medicine for multiple medical problem. He has been started on IV zosyn. Will consult IR for possible perc drain of abdominal abscess. Keep NPO, IVF, pain control. Please hold plavix.  Wellington Hampshire, [facility] Surgery 03/26/2017, 3:57 PM Pager: [phone] Consults: [phone] Mon-Fri 7:00 am-4:30 pm Sat-Sun 7:00 am-11:30 am

## 2017-03-26 NOTE — Progress Notes (Signed)
Pharmacy Antibiotic Note  Rodney Farley is a 83 y.o. male admitted on 03/26/2017 with possible abscess secondary to perforated appendicitis.  Pharmacy has been consulted for Zosyn dosing.  Pt received Zosyn 3.375gm IV x 1 in ED at 1640.  Pt has PMH of CKD with baseline SCr ~ 2.7-2.9.  SCr currently 3.7 and CrCl <20.  Plan: Zosyn 2.25gm IV q8h Follow-up culture data, clinical progress, and renal function.  Height: 5\' 7"  (170.2 cm) Weight: 221 lb (100.2 kg) IBW/kg (Calculated) : 66.1  Temp (24hrs), Avg:98.6 F (37 C), Min:98.1 F (36.7 C), Max:99.1 F (37.3 C)   Recent Labs Lab 03/25/17 1309 03/26/17 1512 03/26/17 1531  WBC 17.3* 13.2*  --   CREATININE 3.60* 3.77*  --   LATICACIDVEN  --   --  0.92    Estimated Creatinine Clearance: 16.7 mL/min (A) (by C-G formula based on SCr of 3.77 mg/dL (H)).    Allergies  Allergen Reactions  . Bee Venom Anaphylaxis  . Lyrica [Pregabalin] Other (See Comments)    hallucinations  . Prednisone Other (See Comments)    hallucinations  . Zocor [Simvastatin] Nausea Only and Other (See Comments)    Headache with brand name only.  Can take the generic.    Antimicrobials this admission: Zosyn 9/14 >>  Dose adjustments this admission:   Microbiology results:   Thank you for allowing pharmacy to be a part of this patient's care.  [employer], Pharm.D., BCPS Clinical Pharmacist 03/26/2017 7:13 PM

## 2017-03-26 NOTE — ED Notes (Signed)
Patients next of kin is [name] and Lucca Ballo. Paul's # is [phone] and Linda's # [phone]

## 2017-03-26 NOTE — Progress Notes (Signed)
Patient admitted from ED, A&O x3, VSS. Tele applied and CCMD notified. Patient denies any needs at this time.

## 2017-03-26 NOTE — H&P (Signed)
[facility] Admission History and Physical Service Pager: [phone]  Patient name: Rodney Farley Medical record number: [id number] Date of birth: March 03, 1932 Age: 83 y.o. Gender: male  Primary Care Provider: [name], MD Consultants: General Surgery, IR Code Status: Full  Chief Complaint: RLQ pain and nausea  Assessment and Plan: Rodney Farley is a 83 y.o. male presenting with RLQ abdominal pain found to have ruptured appendicitis with abscess. PMH is significant for COPD, CKD4, CAD with pacemaker and defibrillator, DM Type II, HTN, HFpEF, MDD and BPH.  RLQ abdominal pain with ruptured appendicitis:  CT of the abdomen and pelvis: 8.3 x 5.6 cm rounded complex abnormality seen adjacent to inferior portion of this cecum with mild surrounding inflammation concerning for possible abscess or phlegmon related to ruptured appendicitis. Possible enlarged and inflamed appendix may be seen in this area, although it is difficult to be certain given the degree of inflammation present. Significant wall thickening of the terminal ileum is noted most likely secondary to adjacent inflammation. On admission patient afebrile with HR 78, BP 128/66 and 95% on RA. LA was 0.92. WBC 13.2 from 17 yesterday. - Admit to stepdown, attending Dr. Mingo Amber  - Vitals and monitoring per unit - Blood cultures pending - UA negative - Zosyn given in ED (9/14-) - Surgery has seen patient - IR consulted for placement of perc drain - CBC, BMP in AM - Pain control with IV dilaudid .5 mg q4 prn severe pain - IV Compazine for nausea 2.34m q6 prn - NPO now, will need diet after placement of drain - Patient received 500 mL bolus in ED, on NS 75 cc/hr for 12 hours  AKI with CKD, stage 4: Cr on admission 3.77 (baseline ~2.7 01/2017). AKI likely prerenal due to dehyration given that patient appeared dry on exam and has not been able to intake much due to nausea.  - Holding Lasix -  Patient received 500 mL bolus in ED, on NS 75 cc/hr for 12 hours - Trend Cr, BMP in AM - follow urine output  HFpEF: Echo 01/2016 showed Normal LV function with EF of 60% s/p ICD and pacemaker up from 25-30% 06/2015. Clinically dry on exam with decreased PO intake since feeling ill. Home meds: Coreg 12.552mBID, Lasix 4042mID, Imdur 30 daily - Daily weights and I/O's - Monitor  - holding home lasix today until he is rehydrated. Reevaluate in the AM. - continue Coreg, Imdur  CAD with stents: Patient on Plavix, ASA 81 mg at home  - Plavix being held per surgery recs for placement of perc drain. - Continue home ASA 81 mg  DM Type II: patient on 60 U of Lantus at home nightly. Last A1c reported at 8.2 on 01/28/2017. Also reports neuropathy. - Will start on 10 U of Lantus nightly, given patient NPO - Titrate Lantus as needed - On sensitive SSI with CBG q4  HTN: Controlled. Normotensive since admission. - continue home carvedilol 12.5 mg BID, Imdur 30 mg daily - Holding home hydralazine and Lasix for now - monitor  COPD: Patient does not require oxygen at home. Was 95% on RA when admitted, but after admission became tachypneic and was placed on 2L [place] in ED. Likely due to pain and nausea. Improved after pain meds and compazine. - Wean off [place] as tolerated, no significant desaturation noted - Not currently on any daily meds - Monitor  MDD: Patient's wife recently left him 1 month ago. Being treated with  Zoloft. PCP informed yesterday that he may switch him to Wellbutrin - Continue Zoloft home dose for now  H/o complete AV block s/p pacemaker: Stable.  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 - CPAP at night  BPH: Stable. - Continue home flomax  HLD: Stable - Continue home atorvastatin  GERD: Stable. - Continue home  Protonix  FEN/GI: NPO Prophylaxis: Heparin  Disposition: Admit to step down  History of Present Illness:  Rodney Farley is a 83 y.o. male presenting with RLQ abdominal pain found to have ruptured appendicitis. PMH is significant for COPD, CKD4, CAD with pacemaker and defibrillator, DM Type II, HTN, HFpEF, MDD and BPH. Patient's son and daughter reporting that starting labor day weekend the patient had a pain in his RUQ area that began moving towards his umbilicus. First started with nausea, which became more intermittent, and then worsened these past few days. Reports that the pain seemed to be cyclical. Patient seen by his primary care doctor yesterday who ordered a CBC and CT of his abdomen. Lab testing showed a leukocytosis of 17 and elevated creatinine of 3.6. He called the patient to come in to the ED today.  On admission patient afebrile with HR 78, BP 128/66 and 95% on RA. LA was 0.92. WBC 13.2 from 17 yesterday. Blood cultures were obtained and patient received 1 500 mL bolus of NS along with one dose of Zosyn. Surgery was consulted and deemed him not a surgical candidate, but consulted IR for percutaneous drain placement.   Review Of Systems: Per HPI with the following additions:   Review of Systems  Constitutional: Positive for chills. Negative for fever.  Respiratory: Negative for shortness of breath.   Cardiovascular: Negative for chest pain.  Gastrointestinal: Positive for abdominal pain, diarrhea and nausea. Negative for vomiting.  Neurological: Negative for headaches.  Psychiatric/Behavioral: Positive for depression.    Patient Active Problem List   Diagnosis Date Noted  . Marital stress 02/18/2017  . Current use of long term anticoagulation - Plavix 06/10/2016  . Trigger finger 06/10/2016  . BPH associated with nocturia 04/27/2016  . Diabetic peripheral neuropathy ([place])   . COPD (chronic obstructive pulmonary disease) ([place])   . Bell's palsy   . Gout 06/26/2015   . Chronic systolic CHF (congestive heart failure) ([place]) 04/08/2015  . Diverticulitis 04/08/2015  . DOE (dyspnea on exertion), due to significant CAD 03/28/2015  . CKD (chronic kidney disease) stage 4, GFR 15-29 ml/min (HCC) 03/04/2015  . Coronary artery disease involving native coronary artery of native heart with angina pectoris ([place]) 09/13/2014  . GERD (gastroesophageal reflux disease) 08/07/2012  . Depression 08/05/2012  . Sleep apnea 08/05/2012  . Uncontrolled type 2 diabetes mellitus with stage 3 chronic kidney disease, with long-term current use of insulin ([place]) 08/05/2012  . HTN (hypertension) 08/05/2012  . Ischemic cardiomyopathy 11/03/2011  . Atrioventricular block, complete ([place])   . Obesity   . Pacemaker     Past Medical History: Past Medical History:  Diagnosis Date  . Anginal pain ([place])   . Anxiety   . Arm pain 05/08/2015   LEFT ARM  . Atrioventricular block, complete ([place])    a. 2010 s/p pacemaker.  . Bell's palsy    left side. after shingles episode  . Chronic systolic CHF (congestive heart failure) ([place])  a. LVEF 35-40% by echo 09/2014.  [name] CKD (chronic kidney disease), stage IV ([place])   . COPD (chronic obstructive pulmonary disease) (HCC)    Severe  . Coronary artery disease    a. s/p MI in 1994/1995 while in [place] s/p questionable PCI. 03/2015: progression of disease, for staged PCI.  [name] Depression   . Diabetic peripheral neuropathy ([place])   . DOE (dyspnea on exertion) 03/28/2015  . GERD (gastroesophageal reflux disease)   . Hypercholesterolemia   . Hypertension   . MI (myocardial infarction) ([place]) 1994; 1995  . Neuropathy    IN LOWER EXTREMITIES  . Obesity   . Pacemaker    medtronic>>> MDT ICD 09/23/15  . Sleep apnea    "sleeps w/humidifyer when he panics and gets short of breath" (04/08/2015)  . TIA (transient ischemic attack) X 3  . Type II diabetes mellitus ([place])     Past Surgical History: Past Surgical History:  Procedure Laterality Date  .  CARDIAC CATHETERIZATION N/A 03/29/2015   Procedure: Right/Left Heart Cath and Coronary Angiography;  Surgeon: Jeyren M [name], MD;  Location: [facility];  Service: Cardiovascular;  Laterality: N/A;  . [place]   "after my MI; put me on heart RX after cath"  . CARDIAC CATHETERIZATION N/A 04/09/2015   Procedure: Coronary Stent Intervention;  Surgeon: Xerxes M [name], MD;  Location: [facility];  Service: Cardiovascular;  Laterality: N/A;  . CHOLECYSTECTOMY OPEN  ~ 1977  . EP IMPLANTABLE DEVICE N/A 09/23/2015   MDT CRT-D, Dr. Caryl Comes  . HIATAL HERNIA REPAIR  1977  . INSERT / REPLACE / REMOVE PACEMAKER  07/2008   Complete heart block status post DDD with good function  . TONSILLECTOMY      Social History: Social History  Substance Use Topics  . Smoking status: Former Smoker    Packs/day: 1.50    Years: 54.00    Types: Cigarettes    Quit date: 07/18/2007  . Smokeless tobacco: Never Used  . Alcohol use No     Comment: 04/08/2015 "probably 3 drinks/month"   Additional social history: Denies alcohol use Please also refer to relevant sections of EMR.  Family History: Family History  Problem Relation Age of Onset  . Stroke Mother   . Leukemia Father   . Stroke Sister   . Heart attack Brother     Allergies and Medications: Allergies  Allergen Reactions  . Bee Venom Anaphylaxis  . Lyrica [Pregabalin] Other (See Comments)    hallucinations  . Prednisone Other (See Comments)    hallucinations  . Zocor [Simvastatin] Nausea Only and Other (See Comments)    Headache with brand name only.  Can take the generic.   No current facility-administered medications on file prior to encounter.    Current Outpatient Prescriptions on File Prior to Encounter  Medication Sig Dispense Refill  . aspirin EC 81 MG tablet Take 81 mg by mouth daily.    [name] atorvastatin (LIPITOR) 40 MG tablet Take 1 tablet (40 mg total) by mouth daily. (Patient taking differently: Take 40 mg  by mouth at bedtime. ) 90 tablet 3  . carvedilol (COREG) 12.5 MG tablet Take 1 tablet (12.5 mg total) by mouth 2 (two) times daily with a meal. 180 tablet 3  . clopidogrel (PLAVIX) 75 MG tablet TAKE 1 TABLET BY MOUTH ONCE DAILY WITH BREAKFAST (Patient taking differently: Take 75 mg by mouth daily with breakfast. ) 90 tablet 3  . furosemide (LASIX) 40 MG  tablet 2 lasix each morning and 1 in afternoon (50m then 475m. (Patient taking differently: Take 40-80 mg by mouth See admin instructions. Take 2 tablets (80 mg) by mouth every morning and 1 tablet (40 mg) in the afternoon) 270 tablet 1  . hydrALAZINE (APRESOLINE) 25 MG tablet TAKE ONE AND ONE-HALF BY MOUTH TWICE DAILY (Patient taking differently: TAKE ONE AND ONE-HALF TABLETS (37.5 MG) BY MOUTH TWICE DAILY) 90 tablet 1  . Insulin Glargine (LANTUS SOLOSTAR) 100 UNIT/ML Solostar Pen INJECT 60 UNITS UNDER THE SKIN ONCE DAILY (Patient taking differently: Inject 60 Units into the skin daily at 10 pm. ) 45 mL 2  . isosorbide mononitrate (IMDUR) 30 MG 24 hr tablet TAKE 1 TABLET BY MOUTH ONCE DAILY (Patient taking differently: TAKE 1 TABLET (30 MG) BY MOUTH ONCE DAILY) 90 tablet 3  . nitroGLYCERIN (NITROSTAT) 0.4 MG SL tablet Place 1 tablet (0.4 mg total) under the tongue every 5 (five) minutes as needed for chest pain. 2 doses before calling for emergent help 25 tablet 3  . pantoprazole (PROTONIX) 40 MG tablet TAKE 1 TABLET BY MOUTH ONCE DAILY (Patient taking differently: TAKE 1 TABLET (40 MG) BY MOUTH ONCE DAILY) 90 tablet 3  . sertraline (ZOLOFT) 100 MG tablet Take 1.5 tablets (150 mg total) by mouth daily. 135 tablet 1  . tamsulosin (FLOMAX) 0.4 MG CAPS capsule TAKE 1 CAPSULE BY MOUTH DAILY AFTER SUPPER (Patient taking differently: Take 0.4 mg by mouth daily after supper. ) 90 capsule 3  . vitamin B-12 (CYANOCOBALAMIN) 1000 MCG tablet Take 1,000 mcg by mouth daily.    . Blood Glucose Monitoring Suppl (FREESTYLE LITE) DEVI     . buPROPion (WELLBUTRIN XL)  150 MG 24 hr tablet Take 1 tablet (150 mg total) by mouth daily. 30 tablet 3  . glucose blood (FREESTYLE TEST STRIPS) test strip Use as instructed 100 each 12  . glucose monitoring kit (FREESTYLE) monitoring kit USE TO MONITOR BLOOD GLUCOSE AS DIRECTED 1 each 1  . Insulin Pen Needle (UNIFINE PENTIPS) 31G X 5 MM MISC USE AS DIRECTED 100 each 0    Objective: BP 128/66 (BP Location: Left Arm)   Pulse 78   Temp 98.1 F (36.7 C) (Oral)   Resp 18   Ht '5\' 7"'  (1.702 m)   Wt 221 lb (100.2 kg)   SpO2 95%   BMI 34.61 kg/m  Exam: General: NAD, pleasant, in fetal position on bed Eyes: PERRL, no conjunctival pallor or injection, left eye unable to open  ENTM: Moist mucous membranes, no pharyngeal erythema or exudate Neck: Supple, no LAD Cardiovascular: RRR, no m/r/g, no LE edema Respiratory: CTA BL, increased work of breathing Gastrointestinal: soft, tender, nondistended, normoactive BS, epigastric hernia noted, reducible MSK: moves 4 extremities equally Derm: no rashes appreciated Neuro: CN II-XII grossly intact Psych: AOx3, appropriate affect  Labs and Imaging: CBC BMET   Recent Labs Lab 03/26/17 1512  WBC 13.2*  HGB 12.0*  HCT 35.8*  PLT 306    Recent Labs Lab 03/25/17 1309  NA 136  K 4.8  CL 99  CO2 26  BUN 77*  CREATININE 3.60*  GLUCOSE 151*  CALCIUM 9.1     Lactic Acid: 0.92 Lipase 32 Ct Abdomen Pelvis Wo Contrast  Result Date: 03/26/2017 CLINICAL DATA:  Acute right-sided abdominal pain. EXAM: CT ABDOMEN AND PELVIS WITHOUT CONTRAST TECHNIQUE: Multidetector CT imaging of the abdomen and pelvis was performed following the standard protocol without IV contrast. COMPARISON:  None. FINDINGS: Lower chest: No  acute abnormality. Hepatobiliary: No focal liver abnormality is seen. Status post cholecystectomy. No biliary dilatation. Pancreas: Unremarkable. No pancreatic ductal dilatation or surrounding inflammatory changes. Spleen: Normal in size without focal abnormality.  Adrenals/Urinary Tract: Adrenal glands appear normal. Bilateral renal cysts are noted, some of which are hyperdense in appearance. No hydronephrosis or renal obstruction is noted. No renal or ureteral calculi are noted. Urinary bladder is unremarkable. Stomach/Bowel: The stomach appears normal. There is no evidence of bowel obstruction. There is a large rounded abnormality seen adjacent to the inferior portion of the cecum that measures 8.3 x 5.6 cm. Mild surrounding inflammation is noted. This is concerning for possible abscess or phlegmon status post ruptured appendicitis. There appears to be an enlarged and inflamed appendix in this area, although it is difficult to be certain given the degree of inflammation in the area. Significant wall thickening of the terminal ileum is noted which may be secondary to the inflammation. Vascular/Lymphatic: Aortic atherosclerosis. No enlarged abdominal or pelvic lymph nodes. Reproductive: Mild prostatic enlargement is noted. Other: No abdominal wall hernia or abnormality. No abdominopelvic ascites. Musculoskeletal: No acute or significant osseous findings. IMPRESSION: 8.3 x 5.6 cm rounded complex abnormality seen adjacent to inferior portion of this cecum with mild surrounding inflammation. This is concerning for possible abscess or phlegmon related to ruptured appendicitis. Possible enlarged and inflamed appendix may be seen in this area, although it is difficult to be certain given the degree of inflammation present. Significant wall thickening of the terminal ileum is noted most likely secondary to adjacent inflammation. Critical Value/emergent results were called by telephone at the time of interpretation on 03/26/2017 at 12:40 pm to Dr. Garret Reddish , who verbally acknowledged these results. Aortic atherosclerosis. Mild prostatic enlargement. Multiple exophytic cysts are seen involving both kidneys, several which appear to be hyperdense and therefore most likely complex  cysts. Ultrasound is recommended to rule out potential masses. Electronically Signed   By: Marijo Conception, M.D.   On: 03/26/2017 12:41    Shirley, [name], DO 03/26/2017, 4:14 PM PGY-1, [place] [facility] pager: [phone], text pages welcome  UPPER LEVEL ADDENDUM  I have read the above note and made revisions highlighted in blue.  Smiley Houseman, MD PGY-2 [facility] Pager [phone]

## 2017-03-26 NOTE — ED Provider Notes (Signed)
[facility] DEPT Provider Note   CSN: [id number] Arrival date & time: 03/26/17  1437     History   Chief Complaint Chief Complaint  Patient presents with  . confirmed ruptured appendix    HPI Rodney Farley is a 83 y.o. male.   The history is provided by the patient, the spouse and medical records.  Abdominal Pain   This is a new problem. The current episode started more than 1 week ago. The problem occurs constantly. The problem has not changed since onset.The pain is associated with an unknown factor. The pain is located in the RUQ and RLQ. The quality of the pain is cramping. The pain is at a severity of 8/10. The pain is severe. Associated symptoms include fever (subj), diarrhea and nausea. Pertinent negatives include vomiting, constipation, dysuria and headaches. The symptoms are aggravated by palpation. Nothing relieves the symptoms. Past workup includes CT scan (with appy seen on CT).    Past Medical History:  Diagnosis Date  . Anginal pain ([place])   . Anxiety   . Arm pain 05/08/2015   LEFT ARM  . Atrioventricular block, complete ([place])    a. 2010 s/p pacemaker.  . Bell's palsy    left side. after shingles episode  . Chronic systolic CHF (congestive heart failure) ([place])    a. LVEF 35-40% by echo 09/2014.  [name] CKD (chronic kidney disease), stage IV ([place])   . COPD (chronic obstructive pulmonary disease) (H[facility])    Severe  . Coronary artery disease    a. s/p MI in 1994/1995 while in [place] s/p questionable PCI. 03/2015: progression of disease, for staged PCI.  [name] Depression   . Diabetic peripheral neuropathy ([place])   . DOE (dyspnea on exertion) 03/28/2015  . GERD (gastroesophageal reflux disease)   . Hypercholesterolemia   . Hypertension   . MI (myocardial infarction) ([place]) 1994; 1995  . Neuropathy    IN LOWER EXTREMITIES  . Obesity   . Pacemaker    medtronic>>> MDT ICD 09/23/15  . Sleep apnea    "sleeps w/humidifyer when he panics and gets short of breath"  (04/08/2015)  . TIA (transient ischemic attack) X 3  . Type II diabetes mellitus [facility])     Patient Active Problem List   Diagnosis Date Noted  . Marital stress 02/18/2017  . Current use of long term anticoagulation - Plavix 06/10/2016  . Trigger finger 06/10/2016  . BPH associated with nocturia 04/27/2016  . Diabetic peripheral neuropathy ([place])   . COPD (chronic obstructive pulmonary disease) ([place])   . Bell's palsy   . Gout 06/26/2015  . Chronic systolic CHF (congestive heart failure) ([place]) 04/08/2015  . Diverticulitis 04/08/2015  . DOE (dyspnea on exertion), due to significant CAD 03/28/2015  . CKD (chronic kidney disease) stage 4, GFR 15-29 ml/min (HCC) 03/04/2015  . Coronary artery disease involving native coronary artery of native heart with angina pectoris ([place]) 09/13/2014  . GERD (gastroesophageal reflux disease) 08/07/2012  . Depression 08/05/2012  . Sleep apnea 08/05/2012  . Uncontrolled type 2 diabetes mellitus with stage 3 chronic kidney disease, with long-term current use of insulin ([place]) 08/05/2012  . HTN (hypertension) 08/05/2012  . Ischemic cardiomyopathy 11/03/2011  . Atrioventricular block, complete ([place])   . Obesity   . Pacemaker     Past Surgical History:  Procedure Laterality Date  . CARDIAC CATHETERIZATION N/A 03/29/2015   Procedure: Right/Left Heart Cath and Coronary Angiography;  Surgeon: Derald M [name], MD;  Location: [facility] CV LAB;  Service: Cardiovascular;  Laterality: N/A;  . [place]   "after my MI; put me on heart RX after cath"  . CARDIAC CATHETERIZATION N/A 04/09/2015   Procedure: Coronary Stent Intervention;  Surgeon: Harjit M [name], MD;  Location: [facility];  Service: Cardiovascular;  Laterality: N/A;  . CHOLECYSTECTOMY OPEN  ~ 1977  . EP IMPLANTABLE DEVICE N/A 09/23/2015   MDT CRT-D, Dr. Caryl Comes  . HIATAL HERNIA REPAIR  1977  . INSERT / REPLACE / REMOVE PACEMAKER  07/2008   Complete heart block status post DDD  with good function  . TONSILLECTOMY         Home Medications    Prior to Admission medications   Medication Sig Start Date End Date Taking? Authorizing Provider  aspirin EC 81 MG tablet Take 81 mg by mouth daily.    [provider]  atorvastatin (LIPITOR) 40 MG tablet Take 1 tablet (40 mg total) by mouth daily. 03/25/17   [name], MD  Blood Glucose Monitoring Suppl (FREESTYLE LITE) DEVI  02/04/17   [provider]  buPROPion (WELLBUTRIN XL) 150 MG 24 hr tablet Take 1 tablet (150 mg total) by mouth daily. 03/25/17   [name], MD  carvedilol (COREG) 12.5 MG tablet Take 1 tablet (12.5 mg total) by mouth 2 (two) times daily with a meal. 03/25/17   [name], MD  clopidogrel (PLAVIX) 75 MG tablet TAKE 1 TABLET BY MOUTH ONCE DAILY WITH BREAKFAST 03/25/17   [name], MD  furosemide (LASIX) 40 MG tablet 2 lasix each morning and 1 in afternoon (66m then 4104m. 02/02/17   HuMarin OlpMD  glucose blood (FREESTYLE TEST STRIPS) test strip Use as instructed 02/04/17   HuMarin OlpMD  glucose monitoring kit (FREESTYLE) monitoring kit USE TO MONITOR BLOOD GLUCOSE AS DIRECTED 02/04/17   HuMarin OlpMD  hydrALAZINE (APRESOLINE) 25 MG tablet TAKE ONE AND ONE-HALF BY MOUTH TWICE DAILY 03/18/17   JoMartiniquePeter M, MD  Insulin Glargine (LANTUS SOLOSTAR) 100 UNIT/ML Solostar Pen INJECT 60 UNITS UNDER THE SKIN ONCE DAILY 09/07/16   HuMarin OlpMD  Insulin Pen Needle (UNIFINE PENTIPS) 31G X 5 MM MISC USE AS DIRECTED 01/28/17   HuMarin OlpMD  isosorbide mononitrate (IMDUR) 30 MG 24 hr tablet TAKE 1 TABLET BY MOUTH ONCE DAILY 03/08/17   JoMartiniquePeter M, MD  nitroGLYCERIN (NITROSTAT) 0.4 MG SL tablet Place 1 tablet (0.4 mg total) under the tongue every 5 (five) minutes as needed for chest pain. 2 doses before calling for emergent help 03/25/17   HuMarin OlpMD  OVER THE COUNTER MEDICATION Place 1 drop into both eyes 3 (three) times  daily. OTC [place] eye bath    [provider]  pantoprazole (PROTONIX) 40 MG tablet TAKE 1 TABLET BY MOUTH ONCE DAILY 03/08/17   JoMartiniquePeter M, MD  sertraline (ZOLOFT) 100 MG tablet Take 1.5 tablets (150 mg total) by mouth daily. 03/25/17   HuMarin OlpMD  tamsulosin (FLOMAX) 0.4 MG CAPS capsule TAKE 1 CAPSULE BY MOUTH DAILY AFTER SUPPER 03/25/17   HuMarin OlpMD  vitamin B-12 (CYANOCOBALAMIN) 1000 MCG tablet Take 1,000 mcg by mouth daily.    [provider]    Family History Family History  Problem Relation Age of Onset  . Stroke Mother   . Leukemia Father   . Stroke Sister   . Heart attack Brother  Social History Social History  Substance Use Topics  . Smoking status: Former Smoker    Packs/day: 1.50    Years: 54.00    Types: Cigarettes    Quit date: 07/18/2007  . Smokeless tobacco: Never Used  . Alcohol use No     Comment: 04/08/2015 "probably 3 drinks/month"     Allergies   Bee venom; Lyrica [pregabalin]; Prednisone; and Zocor [simvastatin]   Review of Systems Review of Systems  Constitutional: Positive for chills and fever (subj). Negative for diaphoresis and fatigue.  HENT: Negative for congestion.   Respiratory: Negative for chest tightness, shortness of breath and stridor.   Cardiovascular: Negative for chest pain.  Gastrointestinal: Positive for abdominal pain, diarrhea and nausea. Negative for abdominal distention, constipation and vomiting.  Genitourinary: Negative for dysuria and flank pain.  Musculoskeletal: Negative for back pain, neck pain and neck stiffness.  Skin: Negative for rash and wound.  Neurological: Negative for headaches.  Psychiatric/Behavioral: Negative for agitation.  All other systems reviewed and are negative.    Physical Exam Updated Vital Signs BP 128/66 (BP Location: Left Arm)   Pulse 78   Temp 98.1 F (36.7 C) (Oral)   Resp 18   Ht _0  (1.702 m)   Wt 100.2 kg (221 lb)   SpO2 95%   BMI  34.61 kg/m   Physical Exam  Constitutional: He is oriented to person, place, and time. He appears well-developed and well-nourished. No distress.  HENT:  Head: Normocephalic.  Mouth/Throat: Oropharynx is clear and moist. No oropharyngeal exudate.  Eyes: Pupils are equal, round, and reactive to light. Conjunctivae and EOM are normal.  Neck: Normal range of motion.  Cardiovascular: Normal rate and intact distal pulses.   No murmur heard. Pulmonary/Chest: Effort normal. No stridor. No respiratory distress. He has no rales. He exhibits no tenderness.  Abdominal: Normal appearance and bowel sounds are normal. There is tenderness in the right upper quadrant and right lower quadrant. There is no rigidity, no rebound and no guarding.    Musculoskeletal: He exhibits no tenderness.  Neurological: He is alert and oriented to person, place, and time. No sensory deficit. He exhibits normal muscle tone.  Skin: Skin is warm. Capillary refill takes less than 2 seconds. He is not diaphoretic. No erythema.  Psychiatric: He has a normal mood and affect.  Nursing note and vitals reviewed.    ED Treatments / Results  Labs (all labs ordered are listed, but only abnormal results are displayed) Labs Reviewed  COMPREHENSIVE METABOLIC PANEL - Abnormal; Notable for the following:       Result Value   Chloride 100 (*)    Glucose, Bld 143 (*)    BUN 79 (*)    Creatinine, Ser 3.77 (*)    Albumin 3.4 (*)    GFR calc non Af Amer 14 (*)    GFR calc Af Amer 16 (*)    All other components within normal limits  CBC - Abnormal; Notable for the following:    WBC 13.2 (*)    RBC 3.96 (*)    Hemoglobin 12.0 (*)    HCT 35.8 (*)    All other components within normal limits  URINALYSIS, ROUTINE W REFLEX MICROSCOPIC - Abnormal; Notable for the following:    Protein, ur 100 (*)    Bacteria, UA RARE (*)    Squamous Epithelial / LPF 0-5 (*)    All other components within normal limits  GLUCOSE, CAPILLARY -  Abnormal; Notable for the following:  Glucose-Capillary 130 (*)    All other components within normal limits  GLUCOSE, CAPILLARY - Abnormal; Notable for the following:    Glucose-Capillary 100 (*)    All other components within normal limits  CULTURE, BLOOD (ROUTINE X 2)  CULTURE, BLOOD (ROUTINE X 2)  LIPASE, BLOOD  CBC  BASIC METABOLIC PANEL  I-STAT CG4 LACTIC ACID, ED  I-STAT CG4 LACTIC ACID, ED    EKG  EKG Interpretation  Date/Time:  Friday March 26 2017 17:23:34 EDT Ventricular Rate:  83 PR Interval:    QRS Duration: 152 QT Interval:  442 QTC Calculation: 520 R Axis:   -94 Text Interpretation:  A-V dual-paced rhythm with some inhibition No further analysis attempted due to paced rhythm wandering baseline Paced rhythm with T wave upright in lead V2 compared to prior.  No STEMI Confirmed by Antony Blackbird [phone]) on 03/27/2017 12:33:12 AM       Radiology Ct Abdomen Pelvis Wo Contrast  Result Date: 03/26/2017 CLINICAL DATA:  Acute right-sided abdominal pain. EXAM: CT ABDOMEN AND PELVIS WITHOUT CONTRAST TECHNIQUE: Multidetector CT imaging of the abdomen and pelvis was performed following the standard protocol without IV contrast. COMPARISON:  None. FINDINGS: Lower chest: No acute abnormality. Hepatobiliary: No focal liver abnormality is seen. Status post cholecystectomy. No biliary dilatation. Pancreas: Unremarkable. No pancreatic ductal dilatation or surrounding inflammatory changes. Spleen: Normal in size without focal abnormality. Adrenals/Urinary Tract: Adrenal glands appear normal. Bilateral renal cysts are noted, some of which are hyperdense in appearance. No hydronephrosis or renal obstruction is noted. No renal or ureteral calculi are noted. Urinary bladder is unremarkable. Stomach/Bowel: The stomach appears normal. There is no evidence of bowel obstruction. There is a large rounded abnormality seen adjacent to the inferior portion of the cecum that measures 8.3 x 5.6  cm. Mild surrounding inflammation is noted. This is concerning for possible abscess or phlegmon status post ruptured appendicitis. There appears to be an enlarged and inflamed appendix in this area, although it is difficult to be certain given the degree of inflammation in the area. Significant wall thickening of the terminal ileum is noted which may be secondary to the inflammation. Vascular/Lymphatic: Aortic atherosclerosis. No enlarged abdominal or pelvic lymph nodes. Reproductive: Mild prostatic enlargement is noted. Other: No abdominal wall hernia or abnormality. No abdominopelvic ascites. Musculoskeletal: No acute or significant osseous findings. IMPRESSION: 8.3 x 5.6 cm rounded complex abnormality seen adjacent to inferior portion of this cecum with mild surrounding inflammation. This is concerning for possible abscess or phlegmon related to ruptured appendicitis. Possible enlarged and inflamed appendix may be seen in this area, although it is difficult to be certain given the degree of inflammation present. Significant wall thickening of the terminal ileum is noted most likely secondary to adjacent inflammation. Critical Value/emergent results were called by telephone at the time of interpretation on 03/26/2017 at 12:40 pm to Dr. Garret Reddish , who verbally acknowledged these results. Aortic atherosclerosis. Mild prostatic enlargement. Multiple exophytic cysts are seen involving both kidneys, several which appear to be hyperdense and therefore most likely complex cysts. Ultrasound is recommended to rule out potential masses. Electronically Signed   By: Marijo Conception, M.D.   On: 03/26/2017 12:41    Procedures Procedures (including critical care time)  CRITICAL CARE Performed by: Gwenyth Allegra Tegeler Total critical care time: 35 minutes Critical care time was exclusive of separately billable procedures and treating other patients. Ruptured appendicitis with intraabdominal abscess.  Critical care  was necessary to treat or prevent imminent  or life-threatening deterioration. Critical care was time spent personally by me on the following activities: development of treatment plan with patient and/or surrogate as well as nursing, discussions with consultants, evaluation of patient's response to treatment, examination of patient, obtaining history from patient or surrogate, ordering and performing treatments and interventions, ordering and review of laboratory studies, ordering and review of radiographic studies, pulse oximetry and re-evaluation of patient's condition.   Medications Ordered in ED Medications  atorvastatin (LIPITOR) tablet 40 mg (0 mg Oral Hold 03/26/17 2232)  carvedilol (COREG) tablet 12.5 mg (not administered)  sertraline (ZOLOFT) tablet 150 mg (150 mg Oral Not Given 03/26/17 2200)  tamsulosin (FLOMAX) capsule 0.4 mg (0 mg Oral Hold 03/26/17 1900)  isosorbide mononitrate (IMDUR) 24 hr tablet 30 mg (not administered)  pantoprazole (PROTONIX) EC tablet 40 mg (not administered)  aspirin EC tablet 81 mg (not administered)  insulin aspart (novoLOG) injection 0-9 Units (1 Units Subcutaneous Given 03/26/17 2055)  prochlorperazine (COMPAZINE) injection 2.5 mg (not administered)  0.9 %  sodium chloride infusion ( Intravenous New Bag/Given 03/26/17 1900)  polyvinyl alcohol (LIQUIFILM TEARS) 1.4 % ophthalmic solution 1 drop (1 drop Both Eyes Given 03/26/17 2232)  insulin glargine (LANTUS) injection 10 Units (10 Units Subcutaneous Given 03/26/17 2232)  piperacillin-tazobactam (ZOSYN) IVPB 2.25 g (0 g Intravenous Stopped 03/26/17 2301)  HYDROmorphone (DILAUDID) injection 0.5 mg (0.5 mg Intravenous Given 03/26/17 2323)  MEDLINE mouth rinse (not administered)  ondansetron (ZOFRAN) 4 MG/2ML injection (4 mg  Given 03/26/17 1511)  fentaNYL (SUBLIMAZE) 100 MCG/2ML injection (50 mcg  Given 03/26/17 1511)  sodium chloride 0.9 % bolus 500 mL (0 mLs Intravenous Stopped 03/26/17 1723)  fentaNYL (SUBLIMAZE)  injection 50 mcg (50 mcg Intravenous Given 03/26/17 1611)  piperacillin-tazobactam (ZOSYN) IVPB 3.375 g (0 g Intravenous Stopped 03/26/17 1811)  prochlorperazine (COMPAZINE) injection 2.5 mg (2.5 mg Intravenous Given 03/26/17 1817)     Initial Impression / Assessment and Plan / ED Course  I have reviewed the triage vital signs and the nursing notes.  Pertinent labs & imaging results that were available during my care of the patient were reviewed by me and considered in my medical decision making (see chart for details).     Rodney Farley is a 83 y.o. male with a past medical history significant for CAD with PCI, CHF with pacemaker, diabetes, and COPD who presents from his PCP for concern of ruptured appendicitis. Patient says that for the last 2 weeks he has had intermittent abdominal pain primarily on the right side. He says the pain goes up to an 8 out of 10 severity with associated nausea but no vomiting. He reports intermittent subjective fevers and chills. He reports diarrhea but denies any constipation. He is still passing flatus normally. He denies recent traumatic injuries. He reports that his last oral intake was yesterday. Patient went disease PCP yesterday for abdominal pain and had lab testing and a CT scan ordered. Lab testing showed a leukocytosis of 17 and elevated creatinine of 3.6. Patient had a CT scan performed and the PCP was called today showing concern for  A possible abscess/phlegmon related to a ruptured appendicitis.  Patient was sent to the emergency department for further evaluation and management.  On exam, patient is tenderness in the right abdomen. No left abdominal tenderness. Lungs clear. Chest nontender. No significant leg swelling or pain.  Patient will have repeat blood testing will also be started on fluids, kept NPO, and will have antibiotics initiated.  4:02 PM General surgery  will see the patient. They're requesting an internal medicine team be called  for admission due to comorbidities and they suspect they will not perform surgery today but will likely place a drain.    Final Clinical Impressions(s) / ED Diagnoses   Final diagnoses:  Ruptured appendicitis     Clinical Impression: 1. Ruptured appendicitis   2. Intra-abdominal abscess [facility])     Disposition: Admit to Hospitalist service with surgery following    Tegeler, Gwenyth Allegra, MD 03/27/17 [phone]

## 2017-03-26 NOTE — ED Notes (Signed)
Blood cultures drawn prior to antibiotics

## 2017-03-27 ENCOUNTER — Inpatient Hospital Stay (HOSPITAL_COMMUNITY): Payer: PPO | Admitting: Certified Registered Nurse Anesthetist

## 2017-03-27 ENCOUNTER — Inpatient Hospital Stay (HOSPITAL_COMMUNITY): Payer: PPO

## 2017-03-27 ENCOUNTER — Encounter (HOSPITAL_COMMUNITY)
Admission: EM | Disposition: A | Discharge status: Skilled Nursing Facility | Payer: Self-pay | Source: Home / Self Care | Attending: Pulmonary Disease

## 2017-03-27 DIAGNOSIS — N179 Acute kidney failure, unspecified: Secondary | ICD-10-CM

## 2017-03-27 DIAGNOSIS — K651 Peritoneal abscess: Secondary | ICD-10-CM

## 2017-03-27 DIAGNOSIS — N184 Chronic kidney disease, stage 4 (severe): Secondary | ICD-10-CM

## 2017-03-27 HISTORY — DX: Acute kidney failure, unspecified: N17.9

## 2017-03-27 HISTORY — PX: LAPAROTOMY: SHX154

## 2017-03-27 HISTORY — PX: ILEOCECETOMY: SHX5857

## 2017-03-27 LAB — GLUCOSE, CAPILLARY
GLUCOSE-CAPILLARY: 100 mg/dL — AB (ref 65–99)
GLUCOSE-CAPILLARY: 105 mg/dL — AB (ref 65–99)
GLUCOSE-CAPILLARY: 99 mg/dL (ref 65–99)
Glucose-Capillary: 115 mg/dL — ABNORMAL HIGH (ref 65–99)

## 2017-03-27 LAB — CBC
HCT: 33.5 % — ABNORMAL LOW (ref 39.0–52.0)
HCT: 26.1 % — ABNORMAL LOW (ref 39.0–52.0)
HEMATOCRIT: 33.9 % — AB (ref 39.0–52.0)
HEMOGLOBIN: 11.1 g/dL — AB (ref 13.0–17.0)
Hemoglobin: 10.8 g/dL — ABNORMAL LOW (ref 13.0–17.0)
Hemoglobin: 8.5 g/dL — ABNORMAL LOW (ref 13.0–17.0)
MCH: 30.3 pg (ref 26.0–34.0)
MCH: 29.8 pg (ref 26.0–34.0)
MCH: 30.4 pg (ref 26.0–34.0)
MCHC: 32.7 g/dL (ref 30.0–36.0)
MCHC: 32.2 g/dL (ref 30.0–36.0)
MCHC: 32.6 g/dL (ref 30.0–36.0)
MCV: 92.6 fL (ref 78.0–100.0)
MCV: 92.3 fL (ref 78.0–100.0)
MCV: 93.2 fL (ref 78.0–100.0)
PLATELETS: 240 10*3/uL (ref 150–400)
Platelets: 255 10*3/uL (ref 150–400)
Platelets: 206 10*3/uL (ref 150–400)
RBC: 3.66 MIL/uL — AB (ref 4.22–5.81)
RBC: 3.63 MIL/uL — AB (ref 4.22–5.81)
RBC: 2.8 MIL/uL — ABNORMAL LOW (ref 4.22–5.81)
RDW: 14.3 % (ref 11.5–15.5)
RDW: 14.1 % (ref 11.5–15.5)
RDW: 14.5 % (ref 11.5–15.5)
WBC: 22.5 10*3/uL — AB (ref 4.0–10.5)
WBC: 23.8 10*3/uL — AB (ref 4.0–10.5)
WBC: 14.8 10*3/uL — ABNORMAL HIGH (ref 4.0–10.5)

## 2017-03-27 LAB — BASIC METABOLIC PANEL
ANION GAP: 12 (ref 5–15)
Anion gap: 14 (ref 5–15)
BUN: 91 mg/dL — ABNORMAL HIGH (ref 6–20)
BUN: 97 mg/dL — ABNORMAL HIGH (ref 6–20)
CALCIUM: 8.6 mg/dL — AB (ref 8.9–10.3)
CHLORIDE: 105 mmol/L (ref 101–111)
CO2: 20 mmol/L — AB (ref 22–32)
CO2: 19 mmol/L — AB (ref 22–32)
CREATININE: 5.75 mg/dL — AB (ref 0.61–1.24)
Calcium: 8.7 mg/dL — ABNORMAL LOW (ref 8.9–10.3)
Chloride: 105 mmol/L (ref 101–111)
Creatinine, Ser: 5.29 mg/dL — ABNORMAL HIGH (ref 0.61–1.24)
GFR calc non Af Amer: 9 mL/min — ABNORMAL LOW (ref >60)
GFR, EST AFRICAN AMERICAN: 10 mL/min — AB (ref >60)
GFR, EST AFRICAN AMERICAN: 9 mL/min — AB (ref >60)
GFR, EST NON AFRICAN AMERICAN: 8 mL/min — AB (ref >60)
Glucose, Bld: 99 mg/dL (ref 65–99)
Glucose, Bld: 97 mg/dL (ref 65–99)
Potassium: 5.7 mmol/L — ABNORMAL HIGH (ref 3.5–5.1)
Potassium: 5.4 mmol/L — ABNORMAL HIGH (ref 3.5–5.1)
SODIUM: 137 mmol/L (ref 135–145)
SODIUM: 138 mmol/L (ref 135–145)

## 2017-03-27 LAB — COMPREHENSIVE METABOLIC PANEL
ALBUMIN: 2 g/dL — AB (ref 3.5–5.0)
ALT: 33 U/L (ref 17–63)
ANION GAP: 9 (ref 5–15)
AST: 59 U/L — ABNORMAL HIGH (ref 15–41)
Alkaline Phosphatase: 52 U/L (ref 38–126)
BILIRUBIN TOTAL: 0.7 mg/dL (ref 0.3–1.2)
BUN: 82 mg/dL — ABNORMAL HIGH (ref 6–20)
CHLORIDE: 119 mmol/L — AB (ref 101–111)
CO2: 15 mmol/L — AB (ref 22–32)
Calcium: 6.2 mg/dL — CL (ref 8.9–10.3)
Creatinine, Ser: 4.49 mg/dL — ABNORMAL HIGH (ref 0.61–1.24)
GFR calc Af Amer: 13 mL/min — ABNORMAL LOW (ref >60)
GFR calc non Af Amer: 11 mL/min — ABNORMAL LOW (ref >60)
Glucose, Bld: 89 mg/dL (ref 65–99)
Potassium: 4.2 mmol/L (ref 3.5–5.1)
SODIUM: 143 mmol/L (ref 135–145)
Total Protein: 4.4 g/dL — ABNORMAL LOW (ref 6.5–8.1)

## 2017-03-27 LAB — SODIUM, URINE, RANDOM

## 2017-03-27 LAB — LACTIC ACID, PLASMA: LACTIC ACID, VENOUS: 1.7 mmol/L (ref 0.5–1.9)

## 2017-03-27 SURGERY — LAPAROTOMY, EXPLORATORY
Anesthesia: General | Site: Abdomen | Laterality: Right

## 2017-03-27 MED ORDER — ALBUMIN HUMAN 5 % IV SOLN
INTRAVENOUS | Status: DC | PRN
  Administered 2017-03-27: 20:00:00 via INTRAVENOUS

## 2017-03-27 MED ORDER — EPHEDRINE SULFATE 50 MG/ML IJ SOLN
INTRAMUSCULAR | Status: DC | PRN
  Administered 2017-03-27: 10 mg via INTRAVENOUS
  Administered 2017-03-27 (×2): 5 mg via INTRAVENOUS

## 2017-03-27 MED ORDER — CALCIUM GLUCONATE 10 % IV SOLN
1.0000 g | Freq: Once | INTRAVENOUS | Status: AC
  Administered 2017-03-27: 1 g via INTRAVENOUS
  Filled 2017-03-27: qty 10

## 2017-03-27 MED ORDER — ROCURONIUM BROMIDE 100 MG/10ML IV SOLN
INTRAVENOUS | Status: DC | PRN
  Administered 2017-03-27: 20 mg via INTRAVENOUS
  Administered 2017-03-27: 60 mg via INTRAVENOUS

## 2017-03-27 MED ORDER — SODIUM CHLORIDE 0.9 % IV SOLN
INTRAVENOUS | Status: DC | PRN
  Administered 2017-03-27: 17:00:00 via INTRAVENOUS

## 2017-03-27 MED ORDER — NEOSTIGMINE METHYLSULFATE 10 MG/10ML IV SOLN
INTRAVENOUS | Status: DC | PRN
  Administered 2017-03-27: 3 mg via INTRAVENOUS

## 2017-03-27 MED ORDER — PROPOFOL 10 MG/ML IV BOLUS
INTRAVENOUS | Status: AC
Start: 2017-03-27 — End: 2017-03-27
  Filled 2017-03-27: qty 20

## 2017-03-27 MED ORDER — PANTOPRAZOLE SODIUM 40 MG IV SOLR
40.0000 mg | INTRAVENOUS | Status: DC
Start: 2017-03-27 — End: 2017-03-31
  Administered 2017-03-27 – 2017-03-30 (×4): 40 mg via INTRAVENOUS
  Filled 2017-03-27 (×4): qty 40

## 2017-03-27 MED ORDER — GLYCOPYRROLATE 0.2 MG/ML IJ SOLN
INTRAMUSCULAR | Status: DC | PRN
  Administered 2017-03-27: 0.4 mg via INTRAVENOUS

## 2017-03-27 MED ORDER — SODIUM CHLORIDE 0.9 % IV SOLN
INTRAVENOUS | Status: DC | PRN
  Administered 2017-03-27 (×3): via INTRAVENOUS

## 2017-03-27 MED ORDER — 0.9 % SODIUM CHLORIDE (POUR BTL) OPTIME
TOPICAL | Status: DC | PRN
  Administered 2017-03-27 (×5): 1000 mL
  Administered 2017-03-27: 4000 mL
  Administered 2017-03-27: 1000 mL

## 2017-03-27 MED ORDER — EPHEDRINE 5 MG/ML INJ
INTRAVENOUS | Status: AC
  Filled 2017-03-27: qty 10

## 2017-03-27 MED ORDER — ONDANSETRON HCL 4 MG/2ML IJ SOLN
4.0000 mg | Freq: Three times a day (TID) | INTRAMUSCULAR | Status: DC | PRN
  Administered 2017-03-28: 4 mg via INTRAVENOUS
  Filled 2017-03-27: qty 2

## 2017-03-27 MED ORDER — SODIUM CHLORIDE 0.9 % IV SOLN
1.0000 g | INTRAVENOUS | Status: DC
  Administered 2017-03-27 – 2017-03-29 (×3): 1 g via INTRAVENOUS
  Filled 2017-03-27 (×5): qty 1

## 2017-03-27 MED ORDER — HEPARIN SODIUM (PORCINE) 5000 UNIT/ML IJ SOLN: 5000.0000 [IU] | Freq: Three times a day (TID) | INTRAMUSCULAR | Status: DC

## 2017-03-27 MED ORDER — FENTANYL CITRATE (PF) 100 MCG/2ML IJ SOLN
INTRAMUSCULAR | Status: DC | PRN
  Administered 2017-03-27: 100 ug via INTRAVENOUS
  Administered 2017-03-27: 50 ug via INTRAVENOUS
  Administered 2017-03-27: 100 ug via INTRAVENOUS

## 2017-03-27 MED ORDER — ETOMIDATE 2 MG/ML IV SOLN
INTRAVENOUS | Status: AC
  Filled 2017-03-27: qty 10

## 2017-03-27 MED ORDER — PROMETHAZINE HCL 25 MG/ML IJ SOLN: 6.2500 mg | INTRAMUSCULAR | Status: DC | PRN

## 2017-03-27 MED ORDER — PHENYLEPHRINE 40 MCG/ML (10ML) SYRINGE FOR IV PUSH (FOR BLOOD PRESSURE SUPPORT)
PREFILLED_SYRINGE | INTRAVENOUS | Status: AC
  Filled 2017-03-27: qty 10

## 2017-03-27 MED ORDER — NEOSTIGMINE METHYLSULFATE 5 MG/5ML IV SOSY
PREFILLED_SYRINGE | INTRAVENOUS | Status: AC
  Filled 2017-03-27: qty 5

## 2017-03-27 MED ORDER — LIDOCAINE HCL (CARDIAC) 20 MG/ML IV SOLN
INTRAVENOUS | Status: DC | PRN
  Administered 2017-03-27: 20 mg via INTRAVENOUS

## 2017-03-27 MED ORDER — MIDAZOLAM HCL 2 MG/2ML IJ SOLN: 0.5000 mg | Freq: Once | INTRAMUSCULAR | Status: DC | PRN

## 2017-03-27 MED ORDER — MEPERIDINE HCL 25 MG/ML IJ SOLN: 6.2500 mg | INTRAMUSCULAR | Status: DC | PRN

## 2017-03-27 MED ORDER — FENTANYL CITRATE (PF) 100 MCG/2ML IJ SOLN
INTRAMUSCULAR | Status: AC
  Filled 2017-03-27: qty 2

## 2017-03-27 MED ORDER — METHOCARBAMOL 1000 MG/10ML IJ SOLN
500.0000 mg | Freq: Three times a day (TID) | INTRAMUSCULAR | Status: DC | PRN
  Filled 2017-03-27: qty 5

## 2017-03-27 MED ORDER — ONDANSETRON HCL 4 MG/2ML IJ SOLN
INTRAMUSCULAR | Status: AC
  Administered 2017-03-27: 4 mg
  Filled 2017-03-27: qty 2

## 2017-03-27 MED ORDER — PHENYLEPHRINE HCL 10 MG/ML IJ SOLN
INTRAVENOUS | Status: DC | PRN
  Administered 2017-03-27: 50 ug/min via INTRAVENOUS

## 2017-03-27 MED ORDER — ETOMIDATE 2 MG/ML IV SOLN
INTRAVENOUS | Status: DC | PRN
  Administered 2017-03-27: 18 mg via INTRAVENOUS

## 2017-03-27 MED ORDER — SODIUM CHLORIDE 0.9 % IV BOLUS (SEPSIS)
1000.0000 mL | Freq: Once | INTRAVENOUS | Status: AC
  Administered 2017-03-27: 1000 mL via INTRAVENOUS

## 2017-03-27 MED ORDER — FENTANYL CITRATE (PF) 100 MCG/2ML IJ SOLN
25.0000 ug | INTRAMUSCULAR | Status: DC | PRN
  Administered 2017-03-27: 50 ug via INTRAVENOUS

## 2017-03-27 MED ORDER — ROCURONIUM BROMIDE 10 MG/ML (PF) SYRINGE
PREFILLED_SYRINGE | INTRAVENOUS | Status: AC
  Filled 2017-03-27: qty 5

## 2017-03-27 MED ORDER — PHENYLEPHRINE HCL 10 MG/ML IJ SOLN
INTRAMUSCULAR | Status: DC | PRN
  Administered 2017-03-27 (×2): 80 ug via INTRAVENOUS

## 2017-03-27 MED ORDER — FENTANYL CITRATE (PF) 100 MCG/2ML IJ SOLN
25.0000 ug | INTRAMUSCULAR | Status: DC | PRN
  Administered 2017-03-27 – 2017-03-28 (×4): 50 ug via INTRAVENOUS
  Filled 2017-03-27 (×4): qty 2

## 2017-03-27 MED ORDER — FENTANYL CITRATE (PF) 250 MCG/5ML IJ SOLN
INTRAMUSCULAR | Status: AC
  Filled 2017-03-27: qty 5

## 2017-03-27 MED ORDER — HYDROMORPHONE HCL 1 MG/ML IJ SOLN
1.0000 mg | INTRAMUSCULAR | Status: DC | PRN
  Administered 2017-03-27: 1 mg via INTRAVENOUS
  Filled 2017-03-27: qty 1

## 2017-03-27 SURGICAL SUPPLY — 46 items
BLADE CLIPPER SURG (BLADE) ×5 IMPLANT
BNDG GAUZE ELAST 4 BULKY (GAUZE/BANDAGES/DRESSINGS) ×5 IMPLANT
CHLORAPREP W/TINT 26ML (MISCELLANEOUS) ×5 IMPLANT
COVER SURGICAL LIGHT HANDLE (MISCELLANEOUS) ×5 IMPLANT
DRAPE LAPAROSCOPIC ABDOMINAL (DRAPES) ×5 IMPLANT
DRAPE WARM FLUID 44X44 (DRAPE) ×5 IMPLANT
ELECT BLADE 4.0 EZ CLEAN MEGAD (MISCELLANEOUS) ×5
ELECT BLADE 6.5 EXT (BLADE) ×5 IMPLANT
ELECT CAUTERY BLADE 6.4 (BLADE) ×5 IMPLANT
ELECT REM PT RETURN 9FT ADLT (ELECTROSURGICAL) ×5
ELECTRODE BLDE 4.0 EZ CLN MEGD (MISCELLANEOUS) ×3 IMPLANT
ELECTRODE REM PT RTRN 9FT ADLT (ELECTROSURGICAL) ×3 IMPLANT
GAUZE SPONGE 4X4 12PLY STRL (GAUZE/BANDAGES/DRESSINGS) ×5 IMPLANT
GLOVE BIO SURGEON STRL SZ7 (GLOVE) ×5 IMPLANT
GLOVE BIO SURGEON STRL SZ7.5 (GLOVE) ×10 IMPLANT
GLOVE BIOGEL PI IND STRL 7.5 (GLOVE) ×3 IMPLANT
GLOVE BIOGEL PI IND STRL 8 (GLOVE) ×9 IMPLANT
GLOVE BIOGEL PI INDICATOR 7.5 (GLOVE) ×2
GLOVE BIOGEL PI INDICATOR 8 (GLOVE) ×6
GLOVE INDICATOR 6.5 STRL GRN (GLOVE) ×5 IMPLANT
GOWN STRL REUS W/ TWL LRG LVL3 (GOWN DISPOSABLE) ×6 IMPLANT
GOWN STRL REUS W/ TWL XL LVL3 (GOWN DISPOSABLE) ×3 IMPLANT
GOWN STRL REUS W/TWL LRG LVL3 (GOWN DISPOSABLE) ×4
GOWN STRL REUS W/TWL XL LVL3 (GOWN DISPOSABLE) ×2
KIT BASIN OR (CUSTOM PROCEDURE TRAY) ×5 IMPLANT
KIT ROOM TURNOVER OR (KITS) ×5 IMPLANT
LIGASURE IMPACT 36 18CM CVD LR (INSTRUMENTS) ×5 IMPLANT
NS IRRIG 1000ML POUR BTL (IV SOLUTION) ×50 IMPLANT
PACK GENERAL/GYN (CUSTOM PROCEDURE TRAY) ×5 IMPLANT
PAD ABD 8X10 STRL (GAUZE/BANDAGES/DRESSINGS) ×10 IMPLANT
PAD ARMBOARD 7.5X6 YLW CONV (MISCELLANEOUS) ×5 IMPLANT
RELOAD PROXIMATE 75MM BLUE (ENDOMECHANICALS) ×10 IMPLANT
SPECIMEN JAR LARGE (MISCELLANEOUS) ×5 IMPLANT
STAPLER GUN LINEAR PROX 60 (STAPLE) ×5 IMPLANT
STAPLER PROXIMATE 75MM BLUE (STAPLE) ×5 IMPLANT
STAPLER VISISTAT 35W (STAPLE) ×5 IMPLANT
SUCTION POOLE TIP (SUCTIONS) ×5 IMPLANT
SUT PDS AB 1 TP1 96 (SUTURE) ×10 IMPLANT
SUT SILK 2 0 SH CR/8 (SUTURE) ×10 IMPLANT
SUT SILK 2 0 TIES 10X30 (SUTURE) ×5 IMPLANT
SUT SILK 3 0 SH CR/8 (SUTURE) ×10 IMPLANT
SUT SILK 3 0 TIES 10X30 (SUTURE) ×5 IMPLANT
TAPE CLOTH SURG 6X10 WHT LF (GAUZE/BANDAGES/DRESSINGS) ×5 IMPLANT
TOWEL OR 17X26 10 PK STRL BLUE (TOWEL DISPOSABLE) ×5 IMPLANT
TRAY FOLEY W/METER SILVER 16FR (SET/KITS/TRAYS/PACK) ×5 IMPLANT
YANKAUER SUCT BULB TIP NO VENT (SUCTIONS) ×5 IMPLANT

## 2017-03-27 NOTE — Op Note (Addendum)
Rodney Farley [id number]   PRE-OPERATIVE DIAGNOSIS:  Peritonitis, perforated appendicitis  POST-OPERATIVE DIAGNOSIS:  same  Procedure(s): EXPLORATORY LAPAROTOMY Ileocecectomy with ileocolic anastomosis  SURGEON:  Sharon Mt. Dema Severin, MD  ASSISTANT: Stark Klein, MD  ANESTHESIA: General endotracheal  EBL:   50cc  DRAINS: none   SPECIMEN:  Terminal ileum, cecum, appendix  COUNTS:  Sponge, needle and instrument counts were reported correct x2 at conclusion of the operation  DISPOSITION:  PACU in guarded but stable condition  FINDINGS: Purulent fluid upon peritoneal entry - cultures sent. Fat containing ventral hernia ~4x4cm which was included in midline incision but ultimately closed primarily as part of the fascial closure. Appendix and cecum completely stuck to retroperitoneum in the right lower quadrant. Lateral wall of cecum and base of appendix appeared diseased and necrotic which necessitated an ileocecectomy. Patient was hemodynamically normal throughout the case. Ascending colon and distal ileum were normal appearing and healthy.  INDICATIONS:  6M admitted with likely perforated appendicitis based on his CT scan. He had RLQ pain for approximately 2 weeks. His WBC was 13 on admission and rose to 22 the following day. That morning his pain was stable and he was not a candidate for an IR drain as there was no discrete abscess, just phlegmonous changes. This afternoon, he developed significant worsening of his abdominal pain. On exam, he was now diffusely tender with rebound and guarding which is a significant change from this morning. Additionally, his WBC is 22 and his renal function had worsened. I believe he has failed an attempt at nonoperative management and as such have recommended proceeding to the operating room  The anatomy & physiology of the digestive tract was discussed. The pathophysiology of appendicitis was discussed. Natural history and a non-operative  approach was additionally discussed which he had failed. I feel the risks of no intervention may lead to serious problems that outweigh the operative risks; therefore, I recommended appendectomy to remove the probable pathology to the patient. I explained laparoscopic techniques with possible need for an open approach. Additionally, I discussed the possible need for a drain placement should this be indicated.  The procedure, material risks (including but not limited to bleeding; infection; scarring; abscess; leak; need for ostomy; hernia; injury to other organs such as bowel, blood vessels, nerves; the need for additional procedures; heart attack; stroke; death), benefits and alternatives to surgery were discussed at length. [name] questions were answered to their satisfaction and the patient elected to proceed with surgery.  I noted a good likelihood this will help address the problem.  Possibility that this will not correct all abdominal symptoms was also explained.  Goals of post-operative recovery were discussed as well.  DESCRIPTION:   The patient was identified & brought into the operating room. SCDs were in place and functioning. General endotracheal anesthesia was administered. Preoperative antibiotics were given. The patient was positioned with arms out. A foley catheter was inserted under sterile conditions. The abdomen was prepped and draped in the standard sterile fashion. A surgical timeout confirmed our plan.  A midline laparotomy was made and the peritoneal cavity entered bluntly. Cultures of the purulent effluent were sent. A Bookwalter retractor was placed. The terminal ileum and cecum were identified in the RLQ. An appendix epiploicae from the sigmoid was stuck to the cecum and was dissected free. The cecum and ascending colon were gently mobilized bluntly off the retroperitoneum - extensive chronic inflammation was encountered with a fibrotic retroperitoneum. Care was taken to avoid  injury  to the duodenum and ureter. The colon was mobililzed cephalad, taking down the hepatic flexure so enough length could be obtained to medialize the colon for subsequent anastomosis. The terminal ileal mesentery was mobilized as well. The terminal ileum was divided with a 37mm GIA blue load. The colon was divided at the level of the proximal ascending colon using a 42mm GIA blue load. The mesentery including the distal branches of the ileocolic pedicle was divided with a Ligasure. The distal ileum and ascending colon were arranged for anastomosis. Stay sutures of 3-0 Silk were utilized. The corner of each respective staple line was cut to create the enterotomy and colotomy. A side-to-side, functional end-to-end ileocolic anastomosis was created using a 89mm GIA blue load. This was viable and hemostatic following. The specimen was passed off for submission to pathology. A TA 60 blue stapler was then used to close the common enterotomy. Additional 3-0 Silk sutures were placed in the crotch of the staple line to take tension off. The mesenteric defect was intentionally left open as it was wide. The entire abdomen was irrigated with copious amounts of warm saline - approximately 6L until the effluent ran clear. Hemostasis was noted to be achieved - taking time to inspect the ligated mesentery, colon and retroperitoneum. Staple line was noted to be intact with no bleeding. All gowns and gloves were changed and the area re-draped for closing. The midline fascia was closed with running #1 looped PDS. The prior ventral hernia was encountered and easily closed primarily. Given the purulence encountered in the peritoneal cavity, the skin was left open and a moist kerlex was placed in the wound bed. The wound was covered with ABD pads and secured with tape. The patient was then extubated and transferred to a stretcher for transport to recover in satisfactory condition. I updated the family on the patient's condition and  their questions were answered. They expressed understanding and appreciation.

## 2017-03-27 NOTE — Progress Notes (Addendum)
Off unit to ultrasound.

## 2017-03-27 NOTE — Consult Note (Signed)
Renal Service Consult Note [facility]  Rodney Farley 03/27/2017 [place] Requesting Physician:  Dr. Shawna Orleans  Reason for Consult:  Acute / CRF HPI: The patient is a 83 y.o. year-old with hx of DM2, TIA, PPM, MI, HTN, neuorpathy, COPD, CKD stage IV, chron systolic CHF who presented to ED on 9/14 with abd pain x 14 days.  CT showed ruptured appendix and pt sent to ED.  Seen by gen surg who recommended IV abx and drain per IR.  IR saw patient and said the fluid collection was a phlegmon and not drainable.  Creat 3.5 on admit now up to 5.  Pt hx known CKD stage IV.  Asked to see for renal failure.  Patient says he sees Dr Florene Glen at [facility], but last appt was in summer 2017, says he should have prob had f/u appts but due to personal problems he could have "missed one or two" (his wife left him in Aug '18).  He denies any SOB, CP.  Having intermittent sharp abd pains and diarrhea every 3-4 hrs.       ROS  denies CP  no joint pain   no HA  no blurry vision  no rash   Past Medical History  Past Medical History:  Diagnosis Date  . Anginal pain ([place])   . Anxiety   . Arm pain 05/08/2015   LEFT ARM  . Atrioventricular block, complete ([place])    a. 2010 s/p pacemaker.  . Bell's palsy    left side. after shingles episode  . Chronic systolic CHF (congestive heart failure) ([place])    a. LVEF 35-40% by echo 09/2014.  [name] CKD (chronic kidney disease), stage IV ([place])   . COPD (chronic obstructive pulmonary disease) (H[facility])    Severe  . Coronary artery disease    a. s/p MI in 1994/1995 while in [place] s/p questionable PCI. 03/2015: progression of disease, for staged PCI.  [name] Depression   . Diabetic peripheral neuropathy ([place])   . DOE (dyspnea on exertion) 03/28/2015  . GERD (gastroesophageal reflux disease)   . Hypercholesterolemia   . Hypertension   . MI (myocardial infarction) ([place]) 1994; 1995  . Neuropathy    IN LOWER EXTREMITIES  . Obesity   . Pacemaker    medtronic>>>  MDT ICD 09/23/15  . Sleep apnea    "sleeps w/humidifyer when he panics and gets short of breath" (04/08/2015)  . TIA (transient ischemic attack) X 3  . Type II diabetes mellitus ([place])    Past Surgical History  Past Surgical History:  Procedure Laterality Date  . CARDIAC CATHETERIZATION N/A 03/29/2015   Procedure: Right/Left Heart Cath and Coronary Angiography;  Surgeon: Jourdain M [name], MD;  Location: [place] [facility];  Service: Cardiovascular;  Laterality: N/A;  . [place]   "after my MI; put me on heart RX after cath"  . CARDIAC CATHETERIZATION N/A 04/09/2015   Procedure: Coronary Stent Intervention;  Surgeon: Avondre M [name], MD;  Location: [place] [facility];  Service: Cardiovascular;  Laterality: N/A;  . CHOLECYSTECTOMY OPEN  ~ 1977  . EP IMPLANTABLE DEVICE N/A 09/23/2015   MDT CRT-D, Dr. Caryl Comes  . HIATAL HERNIA REPAIR  1977  . INSERT / REPLACE / REMOVE PACEMAKER  07/2008   Complete heart block status post DDD with good function  . TONSILLECTOMY     Family History  Family History  Problem Relation Age of Onset  . Stroke Mother   . Leukemia Father   .  Stroke Sister   . Heart attack Brother    Social History  reports that he quit smoking about 9 years ago. His smoking use included Cigarettes. He has a 81.00 pack-year smoking history. He has never used smokeless tobacco. He reports that he does not drink alcohol or use drugs. Allergies  Allergies  Allergen Reactions  . Bee Venom Anaphylaxis  . Lyrica [Pregabalin] Other (See Comments)    hallucinations  . Prednisone Other (See Comments)    hallucinations  . Zocor [Simvastatin] Nausea Only and Other (See Comments)    Headache with brand name only.  Can take the generic.   Home medications Prior to Admission medications   Medication Sig Start Date End Date Taking? Authorizing Provider  aspirin EC 81 MG tablet Take 81 mg by mouth daily.   Yes [provider]  atorvastatin (LIPITOR) 40 MG tablet Take  1 tablet (40 mg total) by mouth daily. Patient taking differently: Take 40 mg by mouth at bedtime.  03/25/17  Yes [name], MD  carvedilol (COREG) 12.5 MG tablet Take 1 tablet (12.5 mg total) by mouth 2 (two) times daily with a meal. 03/25/17  Yes [name], MD  clopidogrel (PLAVIX) 75 MG tablet TAKE 1 TABLET BY MOUTH ONCE DAILY WITH BREAKFAST Patient taking differently: Take 75 mg by mouth daily with breakfast.  03/25/17  Yes [name], MD  furosemide (LASIX) 40 MG tablet 2 lasix each morning and 1 in afternoon (86m then 451m. Patient taking differently: Take 40-80 mg by mouth See admin instructions. Take 2 tablets (80 mg) by mouth every morning and 1 tablet (40 mg) in the afternoon 02/02/17  Yes HuMarin OlpMD  hydrALAZINE (APRESOLINE) 25 MG tablet TAKE ONE AND ONE-HALF BY MOUTH TWICE DAILY Patient taking differently: TAKE ONE AND ONE-HALF TABLETS (37.5 MG) BY MOUTH TWICE DAILY 03/18/17  Yes JoMartiniquePeter M, MD  Insulin Glargine (LANTUS SOLOSTAR) 100 UNIT/ML Solostar Pen INJECT 60 UNITS UNDER THE SKIN ONCE DAILY Patient taking differently: Inject 60 Units into the skin daily at 10 pm.  09/07/16  Yes HuMarin OlpMD  isosorbide mononitrate (IMDUR) 30 MG 24 hr tablet TAKE 1 TABLET BY MOUTH ONCE DAILY Patient taking differently: TAKE 1 TABLET (30 MG) BY MOUTH ONCE DAILY 03/08/17  Yes JoMartiniquePeter M, MD  nitroGLYCERIN (NITROSTAT) 0.4 MG SL tablet Place 1 tablet (0.4 mg total) under the tongue every 5 (five) minutes as needed for chest pain. 2 doses before calling for emergent help 03/25/17  Yes HuMarin OlpMD  pantoprazole (PROTONIX) 40 MG tablet TAKE 1 TABLET BY MOUTH ONCE DAILY Patient taking differently: TAKE 1 TABLET (40 MG) BY MOUTH ONCE DAILY 03/08/17  Yes JoMartiniquePeter M, MD  Polyethylene Glycol 400 (BLINK TEARS) 0.25 % GEL Place 1 drop into both eyes at bedtime.   Yes [provider]  Polyethylene Glycol 400 (BLINK TEARS) 0.25 % SOLN Place 1 drop  into both eyes 3 (three) times daily.   Yes [provider]  sertraline (ZOLOFT) 100 MG tablet Take 1.5 tablets (150 mg total) by mouth daily. 03/25/17  Yes HuMarin OlpMD  tamsulosin (FLOMAX) 0.4 MG CAPS capsule TAKE 1 CAPSULE BY MOUTH DAILY AFTER SUPPER Patient taking differently: Take 0.4 mg by mouth daily after supper.  03/25/17  Yes HuMarin OlpMD  vitamin B-12 (CYANOCOBALAMIN) 1000 MCG tablet Take 1,000 mcg by mouth daily.   Yes [provider]  Blood Glucose Monitoring Suppl (  FREESTYLE LITE) DEVI  02/04/17   [provider]  buPROPion (WELLBUTRIN XL) 150 MG 24 hr tablet Take 1 tablet (150 mg total) by mouth daily. 03/25/17   [name], MD  glucose blood (FREESTYLE TEST STRIPS) test strip Use as instructed 02/04/17   [name], MD  glucose monitoring kit (FREESTYLE) monitoring kit USE TO MONITOR BLOOD GLUCOSE AS DIRECTED 02/04/17   [name], MD  Insulin Pen Needle (UNIFINE PENTIPS) 31G X 5 MM MISC USE AS DIRECTED 01/28/17   [name], MD   Liver Function Tests  Recent Labs Lab 03/25/17 1309 03/26/17 1512  AST 33 22  ALT 37 34  ALKPHOS 115 114  BILITOT 0.3 0.7  PROT 6.4 7.2  ALBUMIN 3.7 3.4*    Recent Labs Lab 03/25/17 1309 03/26/17 1512  LIPASE 54.0 32   CBC  Recent Labs Lab 03/25/17 1309 03/26/17 1512 03/27/17 0705  WBC 17.3* 13.2* 22.5*  NEUTROABS 14.0*  --   --   HGB 11.5* 12.0* 11.1*  HCT 35.7* 35.8* 33.9*  MCV 92.7 90.4 92.6  PLT 301.0 306 161   Basic Metabolic Panel  Recent Labs Lab 03/25/17 1309 03/26/17 1512 03/27/17 0705  NA 136 135 137  K 4.8 4.8 5.7*  CL 99 100* 105  CO2 26 22 20*  GLUCOSE 151* 143* 99  BUN 77* 79* 91*  CREATININE 3.60* 3.77* 5.29*  CALCIUM 9.1 9.2 8.7*   Iron/TIBC/Ferritin/ %Sat No results found for: IRON, TIBC, FERRITIN, IRONPCTSAT  Vitals:   03/27/17 0402 03/27/17 0500 03/27/17 0800 03/27/17 1100  BP: 113/62   (!) 108/54  Pulse: 72   70  Resp: (!)  23     Temp: 98.5 F (36.9 C)  98.3 F (36.8 C)   TempSrc: Oral  Oral   SpO2: 93%     Weight:  95.6 kg (210 lb 12.2 oz)    Height:       Exam Gen awake, pleasant, lying flat No rash, cyanosis or gangrene Sclera anicteric, throat clear  No jvd or bruits Chest clear bilat to bases RRR soft sem , no RG Abd soft distended, mod tender throughout, dec'd BS GU normal male MS no joint effusions or deformity Ext no LE or UE edema / no wounds or ulcers Neuro is alert, Ox 3 , nf   UA > 100 prot, o/w negative CT abd/ pelv > 8.3 x 5.6 cm rounded complex abnormality seen adjacent to inferior portion of this cecum with mild surrounding inflammation. This is concerning for possible abscess or phlegmon related to ruptured appendicitis. Possible enlarged and inflamed appendix may be seen in this area, although it is difficult to be certain given the degree of inflammation present. Significant wall thickening of the terminal ileum is noted most likely secondary to adjacent inflammation..... bilat renal cysts, no hydro/ renal obstruction seen, no calculi, bladder wnl... Lungs clear, no edema   Na 137  K 5.7  CO2 20  BUN 91  Cr 5.29   CA 8.7   WBC 22  Hb 11  plt 255 Alb 3.4 yest   eGFR today 10.     Date  Creat  eGFR 2010- 2015 1.5- 1.9 2016  1.8-  2.5 2017  1.9- 2.9 23- 09 Feb 2017 2.91  Sept 2018 3.7 > 5.29 10- 16     Impression: 1.  Acute on CRF - BP's on low side, may be dry as well. No vol excess on exam and CT  chest shows dry lungs.  Will increase IVF's and stop coreg and imdur due to soft BP's, hopefully renal perfusion will improve. Get baseline CXR.  Place foley if not already in.  Has known CKD stage IV as per chart above.   2.  Ruptured appendix - per primary team, on IV abx 3.  Ischemic CM/ hx CAD sp mult stents/ EF 35-40% last echo 4.  DM on insulin 5.  Volume - room for volume   Plan - will follow  Kelly Splinter MD [place] pager [phone]    03/27/2017, 1:03 PM

## 2017-03-27 NOTE — Progress Notes (Signed)
CBG's done by patient care technician (CNA) Meghan. Asked to download machine, after inquiry of not being documented, stated that docking machine broken. Last blood sugar obtained before transport to [facility] was 90 mg/dl, reported to Melvenia Beam, RN-short stay.

## 2017-03-27 NOTE — Progress Notes (Signed)
Patient ID: Rodney Farley, male   DOB: Nov 27, 1931, 83 y.o.   MRN: [id number]  [facility] Surgery Progress Note     Subjective: CC- abdominal pain Patient states that he continues to have significant lower abdominal pain. Pain medication not helping. No n/v. IR unable to place drain, appears to be phlegmon on CT. WBC up to 22.5, afebrile.  He has not urinated since admission. Creatinine up 5.29 from 3.77.   Objective: Vital signs in last 24 hours: Temp:  [98.1 F (36.7 C)-99.1 F (37.3 C)] 98.5 F (36.9 C) (09/15 0402) Pulse Rate:  [70-89] 72 (09/15 0402) Resp:  [18-39] 23 (09/15 0402) BP: (96-155)/(50-72) 113/62 (09/15 0402) SpO2:  [93 %-96 %] 93 % (09/15 0402) Weight:  [210 lb 12.2 oz (95.6 kg)-221 lb (100.2 kg)] 210 lb 12.2 oz (95.6 kg) (09/15 0500)    Intake/Output from previous day: 09/14 0701 - 09/15 0700 In: 1000 [I.V.:900; IV Piggyback:100] Out: -  Intake/Output this shift: No intake/output data recorded.  PE: Gen:  Alert, NAD, pleasant HEENT: EOM's intact, pupils equal and round. Left eyelid lag. Card:  RRR, no M/G/R heard Pulm:  CTAB, no W/R/R, effort normal Abd: well healed midline incision with soft reducible hernia at proximal aspect, soft, mild distension, +BS, no masses or organomegaly. +TTP RLQ and LLQ with no rebound or guarding Ext:  No erythema, edema, or tenderness BUE/BLE  Psych: A&Ox3  Skin: no rashes noted, warm and dry  Lab Results:   Recent Labs  03/26/17 1512 03/27/17 0705  WBC 13.2* 22.5*  HGB 12.0* 11.1*  HCT 35.8* 33.9*  PLT 306 255   BMET  Recent Labs  03/26/17 1512 03/27/17 0705  NA 135 137  K 4.8 5.7*  CL 100* 105  CO2 22 20*  GLUCOSE 143* 99  BUN 79* 91*  CREATININE 3.77* 5.29*  CALCIUM 9.2 8.7*   PT/INR No results for input(s): LABPROT, INR in the last 72 hours. CMP     Component Value Date/Time   NA 137 03/27/2017 0705   NA 139 09/18/2015 1546   K 5.7 (H) 03/27/2017 0705   CL 105 03/27/2017 0705    CO2 20 (L) 03/27/2017 0705   GLUCOSE 99 03/27/2017 0705   BUN 91 (H) 03/27/2017 0705   BUN 45 (H) 09/18/2015 1546   CREATININE 5.29 (H) 03/27/2017 0705   CREATININE 1.94 (H) 10/04/2015 1159   CALCIUM 8.7 (L) 03/27/2017 0705   PROT 7.2 03/26/2017 1512   ALBUMIN 3.4 (L) 03/26/2017 1512   AST 22 03/26/2017 1512   ALT 34 03/26/2017 1512   ALKPHOS 114 03/26/2017 1512   BILITOT 0.7 03/26/2017 1512   GFRNONAA 9 (L) 03/27/2017 0705   GFRAA 10 (L) 03/27/2017 0705   Lipase     Component Value Date/Time   LIPASE 32 03/26/2017 1512       Studies/Results: Ct Abdomen Pelvis Wo Contrast  Result Date: 03/26/2017 CLINICAL DATA:  Acute right-sided abdominal pain. EXAM: CT ABDOMEN AND PELVIS WITHOUT CONTRAST TECHNIQUE: Multidetector CT imaging of the abdomen and pelvis was performed following the standard protocol without IV contrast. COMPARISON:  None. FINDINGS: Lower chest: No acute abnormality. Hepatobiliary: No focal liver abnormality is seen. Status post cholecystectomy. No biliary dilatation. Pancreas: Unremarkable. No pancreatic ductal dilatation or surrounding inflammatory changes. Spleen: Normal in size without focal abnormality. Adrenals/Urinary Tract: Adrenal glands appear normal. Bilateral renal cysts are noted, some of which are hyperdense in appearance. No hydronephrosis or renal obstruction is noted. No renal or ureteral  calculi are noted. Urinary bladder is unremarkable. Stomach/Bowel: The stomach appears normal. There is no evidence of bowel obstruction. There is a large rounded abnormality seen adjacent to the inferior portion of the cecum that measures 8.3 x 5.6 cm. Mild surrounding inflammation is noted. This is concerning for possible abscess or phlegmon status post ruptured appendicitis. There appears to be an enlarged and inflamed appendix in this area, although it is difficult to be certain given the degree of inflammation in the area. Significant wall thickening of the terminal  ileum is noted which may be secondary to the inflammation. Vascular/Lymphatic: Aortic atherosclerosis. No enlarged abdominal or pelvic lymph nodes. Reproductive: Mild prostatic enlargement is noted. Other: No abdominal wall hernia or abnormality. No abdominopelvic ascites. Musculoskeletal: No acute or significant osseous findings. IMPRESSION: 8.3 x 5.6 cm rounded complex abnormality seen adjacent to inferior portion of this cecum with mild surrounding inflammation. This is concerning for possible abscess or phlegmon related to ruptured appendicitis. Possible enlarged and inflamed appendix may be seen in this area, although it is difficult to be certain given the degree of inflammation present. Significant wall thickening of the terminal ileum is noted most likely secondary to adjacent inflammation. Critical Value/emergent results were called by telephone at the time of interpretation on 03/26/2017 at 12:40 pm to Dr. Garret Reddish , who verbally acknowledged these results. Aortic atherosclerosis. Mild prostatic enlargement. Multiple exophytic cysts are seen involving both kidneys, several which appear to be hyperdense and therefore most likely complex cysts. Ultrasound is recommended to rule out potential masses. Electronically Signed   By: Marijo Conception, M.D.   On: 03/26/2017 12:41    Anti-infectives: Anti-infectives    Start     Dose/Rate Route Frequency Ordered Stop   03/26/17 2200  piperacillin-tazobactam (ZOSYN) IVPB 2.25 g     2.25 g 100 mL/hr over 30 Minutes Intravenous Every 8 hours 03/26/17 1914     03/26/17 1600  piperacillin-tazobactam (ZOSYN) IVPB 3.375 g     3.375 g 100 mL/hr over 30 Minutes Intravenous  Once 03/26/17 1555 03/26/17 1811       Assessment/Plan HTN HLD GERD DM COPD OSA CHF (ECHO 01/2006 EF 60-65%) CAD with stents- holding plavix (last dose 9/14) BPH H/o complete AV block s/p pacemaker placement Depression Hyperkalemia - K 5.7 CKD-4 (Cr baseline ~2.7)/AKI - Cr  up 5.29 from 3.77. Oliguric. Increase IVF. Check renal u/s.  Ruptured appendicitis with abscess - abdominal surgical history: hiatal hernia repair, open cholecystectomy - 2 weeks of intermittent followed by gradually worsening RLQ pain with nausea, vomiting, and diarrhea - CT scan shows an 8.3x5.6cm possible abscess secondary to perforated appendicitis - IR unable to drain, appears to be phlegmon - WBC up 22.5, afebrile  ID - zosyn 9/14>>day#2. Blood cultures pending. VTE - heparin FEN - IVF, NPO Foley - none Follow up - TBD  Plan - Continue IV zosyn and follow blood cultures. Patient oliguric with worsening AKI, increase IVF to 161mL/hr and check renal u/s. Strict I&O's. Increase dilaudid to 1mg  and add robaxin.   LOS: 1 day    Wellington Hampshire , [facility] Surgery 03/27/2017, 9:00 AM Pager: [phone] Consults: [phone] Mon-Fri 7:00 am-4:30 pm Sat-Sun 7:00 am-11:30 am

## 2017-03-27 NOTE — Progress Notes (Signed)
Family Medicine Teaching Service Daily Progress Note Intern Pager: [phone]  Patient name: Rodney Farley Medical record number: [id number] Date of birth: 24-Apr-1932 Age: 83 y.o. Gender: male  Primary Care Provider: [name], MD Consultants: General Surgery, IR Code Status: Full  Pt Overview and Major Events to Date:  9/14 Admitted for ruptured appendicitis  Assessment and Plan: Rodney Farley is a 83 y.o. male presenting with RLQ abdominal pain found to have ruptured appendicitis with abscess. PMH is significant for COPD, CKD4, CAD with pacemaker and defibrillator, DM Type II, HTN, HFpEF, MDD and BPH.  Ruptured appendicitis:CT abd/pelvis 8.3 x 5.6 cm rounded complex abnormality adjacent to inferior cecum with mild surrounding inflammation concerning for possible abscess or phlegmon related to ruptured appendicitis. Afebrile overnight but worsening WBC 13.2 > 22.5 this morning with abdominal distension  concerning for  - Not surgical candidate per general surgery, recommended IR drainage and IV ABX - Per IR appears to be phlegmon and unable to drain, recommended IV ABX - Blood cultures pending - Broaden from zosyn (9/14-9/15) to meropenem (9/15-) given worsening WBC and possible developing septic shock with oligouria - IV dilaudid 0.5 mg q3 prn severe pain - IV Compazine 2.5mg  q6 prn nausea - recheck lactic acid this am  AKI with CKD stage 4, worsening: Cr 3.77>5.29 (baseline ~2.7), initially thought to be prerenal given nausea and poor po intake. Now oliguric. - Holding Lasix, avoid nephrotoxic medications - MIVF  - Trend BMP - follow urine output  - renal ultrasound - consult nephro  Hyperkalemia. K 5.7 today - recheck BMP this pm  HFpEF: Echo 01/2016  EF of 60% s/p ICD and pacemaker up from 25-30% 06/2015. Home meds: Coreg 12.5mg  BID, Lasix 40mg  BID, Imdur 30 daily - Daily weights and I/O's - holding home lasix d/t AKI - continue Coreg, Imdur  CAD  with stents: Patient on Plavix, ASA 81 mg at home  - Holding plavix and ASA  DM Type II: patient on 60 U of Lantus at home nightly. Last A1c reported at 8.2 on 01/28/2017. Also reports neuropathy. - 10 U of Lantus nightly, titrate as needed - sensitive SSI - Monitor CBG q4  HTN: Controlled. - continue home carvedilol 12.5 mg BID, Imdur 30 mg daily - Holding home hydralazine and Lasix for now - monitor  COPD: Not on oxygen at home. Was 95% on RA when admitted, but after admission became tachypneic and was placed on 3L [place] in ED. Likely due to pain and nausea. Improved after pain meds and compazine. No wheezing on exam - Wean off [place] as tolerated, no significant desaturation noted - Not currently on any daily meds - Monitor  MDD: Patient's wife recently left him 1 month ago. On zoloft at home, informed by PCP that may be switched to wellbutrin as outpt - Continue Zoloft home dose for now  H/o complete AV block s/p pacemaker: Stable.  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 - CPAP at night  BPH: Stable. - Continue home flomax  HLD: Stable - Continue home atorvastatin  GERD: Stable. - Continue home Protonix  FEN/GI: MIVF NS, NPO Prophylaxis: SCDs  Disposition: pending medical management  Subjective:  Continues to have abdominal pain and nausea today. Also feels some soreness in his buttocks from laying in hospital bed but  unable to adjust position d/t abdominal pain with any movement. No fever/chills or diaphoresis.   Objective: Temp:  [98.1 F (36.7 C)-99.1 F (37.3 C)] 98.5 F (36.9 C) (09/15 0402) Pulse Rate:  [70-89] 72 (09/15 0402) Resp:  [18-39] 23 (09/15 0402) BP: (96-155)/(50-72) 113/62 (09/15 0402) SpO2:  [93 %-96 %] 93 % (09/15 0402) Weight:  [210 lb 12.2 oz (95.6 kg)-221 lb (100.2 kg)] 210  lb 12.2 oz (95.6 kg) (09/15 0500) Physical Exam: General: NAD, pleasant, laying in bed comfortably Cardiovascular: RRR, no murmurs. Respiratory: CTAB, normal work of breathing with nasal cannula in place Abdomen: TTP diffusely with some distention, soft, + guarding. No rebound. + bowel sounds Extremities: no LE edema  Laboratory:  Recent Labs Lab 03/25/17 1309 03/26/17 1512  WBC 17.3* 13.2*  HGB 11.5* 12.0*  HCT 35.7* 35.8*  PLT 301.0 306    Recent Labs Lab 03/25/17 1309 03/26/17 1512  NA 136 135  K 4.8 4.8  CL 99 100*  CO2 26 22  BUN 77* 79*  CREATININE 3.60* 3.77*  CALCIUM 9.1 9.2  PROT 6.4 7.2  BILITOT 0.3 0.7  ALKPHOS 115 114  ALT 37 34  AST 33 22  GLUCOSE 151* 143*     Imaging/Diagnostic Tests: Ct Abdomen Pelvis Wo Contrast  Result Date: 03/26/2017 CLINICAL DATA:  Acute right-sided abdominal pain. EXAM: CT ABDOMEN AND PELVIS WITHOUT CONTRAST TECHNIQUE: Multidetector CT imaging of the abdomen and pelvis was performed following the standard protocol without IV contrast. COMPARISON:  None. FINDINGS: Lower chest: No acute abnormality. Hepatobiliary: No focal liver abnormality is seen. Status post cholecystectomy. No biliary dilatation. Pancreas: Unremarkable. No pancreatic ductal dilatation or surrounding inflammatory changes. Spleen: Normal in size without focal abnormality. Adrenals/Urinary Tract: Adrenal glands appear normal. Bilateral renal cysts are noted, some of which are hyperdense in appearance. No hydronephrosis or renal obstruction is noted. No renal or ureteral calculi are noted. Urinary bladder is unremarkable. Stomach/Bowel: The stomach appears normal. There is no evidence of bowel obstruction. There is a large rounded abnormality seen adjacent to the inferior portion of the cecum that measures 8.3 x 5.6 cm. Mild surrounding inflammation is noted. This is concerning for possible abscess or phlegmon status post ruptured appendicitis. There appears to be an  enlarged and inflamed appendix in this area, although it is difficult to be certain given the degree of inflammation in the area. Significant wall thickening of the terminal ileum is noted which may be secondary to the inflammation. Vascular/Lymphatic: Aortic atherosclerosis. No enlarged abdominal or pelvic lymph nodes. Reproductive: Mild prostatic enlargement is noted. Other: No abdominal wall hernia or abnormality. No abdominopelvic ascites. Musculoskeletal: No acute or significant osseous findings. IMPRESSION: 8.3 x 5.6 cm rounded complex abnormality seen adjacent to inferior portion of this cecum with mild surrounding inflammation. This is concerning for possible abscess or phlegmon related to ruptured appendicitis. Possible enlarged and inflamed appendix may be seen in this area, although it is difficult to be certain given the degree of inflammation present. Significant wall thickening of the terminal ileum is noted most likely secondary to adjacent inflammation. Critical Value/emergent results were called by telephone at the time of interpretation on 03/26/2017 at 12:40 pm to Dr. Garret Reddish , who verbally acknowledged these results. Aortic atherosclerosis. Mild prostatic enlargement. Multiple exophytic cysts are seen involving both kidneys, several which appear to be hyperdense and therefore most likely complex cysts. Ultrasound is recommended to rule out potential masses. Electronically Signed   By: Marijo Conception, M.D.  On: 03/26/2017 12:41    Bufford Lope, DO 03/27/2017, 7:56 AM PGY-2, [place] Intern pager: [phone], text pages welcome

## 2017-03-27 NOTE — Anesthesia Procedure Notes (Signed)
Arterial Line Insertion Start/End9/15/2018 5:55 PM, 03/27/2017 6:57 PM Performed by: Inda Coke, CRNA  Patient location: OR. Preanesthetic checklist: patient identified and monitors and equipment checked Left, radial was placed Catheter size: 20 G Hand hygiene performed  and maximum sterile barriers used   Attempts: 1 Procedure performed without using ultrasound guided technique. Following insertion, Biopatch and dressing applied. Post procedure assessment: normal  Patient tolerated the procedure well with no immediate complications.

## 2017-03-27 NOTE — Progress Notes (Signed)
Patient with no void for my shift. Asked Patient if he felt the need to urinate and patient states no. Bladder scanned patient for 239 ml. Will continue to monitor.

## 2017-03-27 NOTE — Progress Notes (Signed)
..   Name: Rodney Farley MRN: [id number] DOB: [birth year]    ADMISSION DATE:  03/26/2017 CONSULTATION DATE:  Apr 26, 2017  REFERRING MD : Mingo Amber MD- [facility]. White, MD  CHIEF COMPLAINT:  2 week history of abdominal pain in RLQ   BRIEF PATIENT DESCRIPTION: 83 y.o. male presenting with RLQ abdominal pain found to have ruptured appendix with abscess. POD 0 s/p ex lap, appendectomy midline fascia closed and skin left open with ABD pads secured by tape.  SIGNIFICANT EVENTS  S/p ex lap with appendectomy and wash out  STUDIES:  04-26-2017: CT Abd/Pelvis April 26, 2017 renal US Apr 26, 2017 CXR   HISTORY OF PRESENT ILLNESS:  83 yr old male with PMHx  DM, HTN, HLD, GERD, CAD, COPD, CKD-3, S/p pacemaker placemen, CHF (ECHO 01/2006 EF 60-65%) presented on 03/25/17 [facility] with 2 week h/o  abdominal pain which was gradually worsening and associated with diarrhea. CT scan showed an 8.3x6.5cm possible abscess secondary to perforated appendicitis. He was evaluated started on IV Abx and initially considered for possible percutaneous drainage of abscess. IR was unable to drain as it appeared to be a phlegmon. Pt re-evaluated by surgery and plan made for  Exploratory laparotomy, appendectomy, possible ileocecectomy, possible right hemicolectomy, possible ostomy; all other indicated procedures. Pt was intubated for procedure required Neosynephrine briefly during procedure. Pt now extubated and off of pressors.  PAST MEDICAL HISTORY :   has a past medical history of Anginal pain ([place]); Anxiety; Arm pain (05/08/2015); Atrioventricular block, complete ([place]); Bell's palsy; Chronic systolic CHF (congestive heart failure) (HCC); CKD (chronic kidney disease), stage IV (HCC); COPD (chronic obstructive pulmonary disease) ([place]); Coronary artery disease; Depression; Diabetic peripheral neuropathy (HCC); DOE (dyspnea on exertion) (04/27/2015); GERD (gastroesophageal reflux disease);  Hypercholesterolemia; Hypertension; MI (myocardial infarction) ([place]) (1994; 1995); Neuropathy; Obesity; Pacemaker; Sleep apnea; TIA (transient ischemic attack) (X 3); and Type II diabetes mellitus ([place]).  has a past surgical history that includes Tonsillectomy; Cholecystectomy open (~ 1977); Insert / replace / remove pacemaker (07/2008); Hiatal hernia repair (1977); Cardiac catheterization (N/A, 03/29/2015); Cardiac catheterization (1995); Cardiac catheterization (N/A, 04/09/2015); and Cardiac catheterization (N/A, 09/23/2015). Prior to Admission medications   Medication Sig Start Date End Date Taking? Authorizing Provider  aspirin EC 81 MG tablet Take 81 mg by mouth daily.   Yes [provider]  atorvastatin (LIPITOR) 40 MG tablet Take 1 tablet (40 mg total) by mouth daily. Patient taking differently: Take 40 mg by mouth at bedtime.  03/25/17  Yes [name], MD  carvedilol (COREG) 12.5 MG tablet Take 1 tablet (12.5 mg total) by mouth 2 (two) times daily with a meal. 03/25/17  Yes [name], MD  clopidogrel (PLAVIX) 75 MG tablet TAKE 1 TABLET BY MOUTH ONCE DAILY WITH BREAKFAST Patient taking differently: Take 75 mg by mouth daily with breakfast.  03/25/17  Yes [name], MD  furosemide (LASIX) 40 MG tablet 2 lasix each morning and 1 in afternoon (37m then 474m. Patient taking differently: Take 40-80 mg by mouth See admin instructions. Take 2 tablets (80 mg) by mouth every morning and 1 tablet (40 mg) in the afternoon 02/02/17  Yes HuMarin OlpMD  hydrALAZINE (APRESOLINE) 25 MG tablet TAKE ONE AND ONE-HALF BY MOUTH TWICE DAILY Patient taking differently: TAKE ONE AND ONE-HALF TABLETS (37.5 MG)  BY MOUTH TWICE DAILY 03/18/17  Yes [name], Dedrick M, MD  Insulin Glargine (LANTUS SOLOSTAR) 100 UNIT/ML Solostar Pen INJECT 60 UNITS UNDER THE SKIN ONCE DAILY Patient taking differently: Inject 60 Units into the skin daily at 10 pm.  09/07/16  Yes [name], MD  isosorbide  mononitrate (IMDUR) 30 MG 24 hr tablet TAKE 1 TABLET BY MOUTH ONCE DAILY Patient taking differently: TAKE 1 TABLET (30 MG) BY MOUTH ONCE DAILY 03/08/17  Yes [name], Najeh M, MD  nitroGLYCERIN (NITROSTAT) 0.4 MG SL tablet Place 1 tablet (0.4 mg total) under the tongue every 5 (five) minutes as needed for chest pain. 2 doses before calling for emergent help 03/25/17  Yes [name], MD  pantoprazole (PROTONIX) 40 MG tablet TAKE 1 TABLET BY MOUTH ONCE DAILY Patient taking differently: TAKE 1 TABLET (40 MG) BY MOUTH ONCE DAILY 03/08/17  Yes [name], Burdett M, MD  Polyethylene Glycol 400 (BLINK TEARS) 0.25 % GEL Place 1 drop into both eyes at bedtime.   Yes [provider]  Polyethylene Glycol 400 (BLINK TEARS) 0.25 % SOLN Place 1 drop into both eyes 3 (three) times daily.   Yes [provider]  sertraline (ZOLOFT) 100 MG tablet Take 1.5 tablets (150 mg total) by mouth daily. 03/25/17  Yes [name], MD  tamsulosin (FLOMAX) 0.4 MG CAPS capsule TAKE 1 CAPSULE BY MOUTH DAILY AFTER SUPPER Patient taking differently: Take 0.4 mg by mouth daily after supper.  03/25/17  Yes [name], MD  vitamin B-12 (CYANOCOBALAMIN) 1000 MCG tablet Take 1,000 mcg by mouth daily.   Yes [provider]  Blood Glucose Monitoring Suppl (FREESTYLE LITE) DEVI  02/04/17   [provider]  buPROPion (WELLBUTRIN XL) 150 MG 24 hr tablet Take 1 tablet (150 mg total) by mouth daily. 03/25/17   [name], MD  glucose blood (FREESTYLE TEST STRIPS) test strip Use as instructed 02/04/17   [name], MD  glucose monitoring kit (FREESTYLE) monitoring kit USE TO MONITOR BLOOD GLUCOSE AS DIRECTED 02/04/17   [name], MD  Insulin Pen Needle (UNIFINE PENTIPS) 31G X 5 MM MISC USE AS DIRECTED 01/28/17   [name], MD   Allergies  Allergen Reactions  . Bee Venom Anaphylaxis  . Lyrica [Pregabalin] Other (See Comments)    hallucinations  . Prednisone Other (See  Comments)    hallucinations  . Zocor [Simvastatin] Nausea Only and Other (See Comments)    Headache with brand name only.  Can take the generic.    FAMILY HISTORY:  family history includes Heart attack in his brother; Leukemia in his father; Stroke in his mother and sister. SOCIAL HISTORY:  reports that he quit smoking about 9 years ago. His smoking use included Cigarettes. He has a 81.00 pack-year smoking history. He has never used smokeless tobacco. He reports that he does not drink alcohol or use drugs.  REVIEW OF SYSTEMS:   Constitutional: Negative for fever, chills, weight loss, malaise/fatigue and diaphoresis.  HENT: Negative for hearing loss, ear pain, nosebleeds, congestion, sore throat, neck pain, tinnitus and ear discharge.   Eyes: Negative for blurred vision, double vision, photophobia, pain, discharge and redness.  Respiratory: Negative for cough, hemoptysis, sputum production, shortness of breath, wheezing and stridor.   Cardiovascular: Negative for chest pain, palpitations, orthopnea, claudication, leg swelling and PND.  Gastrointestinal: Negative for heartburn, nausea, vomiting, abdominal pain, diarrhea, constipation, blood in stool and melena.  Genitourinary: Negative for dysuria, urgency, frequency, hematuria  and flank pain.  Musculoskeletal: Negative for myalgias, back pain, joint pain and falls.  Skin: Negative for itching and rash.  Neurological: Negative for dizziness, tingling, tremors, sensory change, speech change, focal weakness, seizures, loss of consciousness, weakness and headaches.  Endo/Heme/Allergies: Negative for environmental allergies and polydipsia. Does not bruise/bleed easily.  SUBJECTIVE:   VITAL SIGNS: Temp:  [97.3 F (36.3 C)-98.7 F (37.1 C)] 97.8 F (36.6 C) (09/15 2215) Pulse Rate:  [69-83] 71 (09/15 2230) Resp:  [18-33] 25 (09/15 2230) BP: (96-120)/(43-76) 107/51 (09/15 2230) SpO2:  [92 %-97 %] 97 % (09/15 2230) Arterial Line BP:  (101-116)/(36-41) 110/41 (09/15 2230) Weight:  [95.6 kg (210 lb 12.2 oz)-101.4 kg (223 lb 8.7 oz)] 101.4 kg (223 lb 8.7 oz) (09/15 2215)  PHYSICAL EXAMINATION: General:  Somnolent, not in any acute distress Neuro: able to follow commands no appreciable focal deficits HEENT:  Normocephalic atraumatic face mask with supplemental oxygen  Cardiovascular:  S1 and S2 appreciated no rubs no gallops Lungs:  Decreased breath sounds at bases Abdomen:  Post op midline incision covered with ABD pads, dressing clean dry and intact.    Recent Labs Lab 03/27/17 0705 03/27/17 1531 03/27/17 2120  NA 137 138 143  K 5.7* 5.4* 4.2  CL 105 105 119*  CO2 20* 19* 15*  BUN 91* 97* 82*  CREATININE 5.29* 5.75* 4.49*  GLUCOSE 99 97 89    Recent Labs Lab 03/27/17 0705 03/27/17 1531 03/27/17 2120  HGB 11.1* 10.8* 8.5*  HCT 33.9* 33.5* 26.1*  WBC 22.5* 23.8* 14.8*  PLT 255 240 206   Ct Abdomen Pelvis Wo Contrast  Result Date: 03/26/2017 CLINICAL DATA:  Acute right-sided abdominal pain. EXAM: CT ABDOMEN AND PELVIS WITHOUT CONTRAST TECHNIQUE: Multidetector CT imaging of the abdomen and pelvis was performed following the standard protocol without IV contrast. COMPARISON:  None. FINDINGS: Lower chest: No acute abnormality. Hepatobiliary: No focal liver abnormality is seen. Status post cholecystectomy. No biliary dilatation. Pancreas: Unremarkable. No pancreatic ductal dilatation or surrounding inflammatory changes. Spleen: Normal in size without focal abnormality. Adrenals/Urinary Tract: Adrenal glands appear normal. Bilateral renal cysts are noted, some of which are hyperdense in appearance. No hydronephrosis or renal obstruction is noted. No renal or ureteral calculi are noted. Urinary bladder is unremarkable. Stomach/Bowel: The stomach appears normal. There is no evidence of bowel obstruction. There is a large rounded abnormality seen adjacent to the inferior portion of the cecum that measures 8.3 x 5.6 cm.  Mild surrounding inflammation is noted. This is concerning for possible abscess or phlegmon status post ruptured appendicitis. There appears to be an enlarged and inflamed appendix in this area, although it is difficult to be certain given the degree of inflammation in the area. Significant wall thickening of the terminal ileum is noted which may be secondary to the inflammation. Vascular/Lymphatic: Aortic atherosclerosis. No enlarged abdominal or pelvic lymph nodes. Reproductive: Mild prostatic enlargement is noted. Other: No abdominal wall hernia or abnormality. No abdominopelvic ascites. Musculoskeletal: No acute or significant osseous findings. IMPRESSION: 8.3 x 5.6 cm rounded complex abnormality seen adjacent to inferior portion of this cecum with mild surrounding inflammation. This is concerning for possible abscess or phlegmon related to ruptured appendicitis. Possible enlarged and inflamed appendix may be seen in this area, although it is difficult to be certain given the degree of inflammation present. Significant wall thickening of the terminal ileum is noted most likely secondary to adjacent inflammation. Critical Value/emergent results were called by telephone at the time of interpretation on  03/26/2017 at 12:40 pm to Dr. Garret Reddish , who verbally acknowledged these results. Aortic atherosclerosis. Mild prostatic enlargement. Multiple exophytic cysts are seen involving both kidneys, several which appear to be hyperdense and therefore most likely complex cysts. Ultrasound is recommended to rule out potential masses. Electronically Signed   By: Marijo Conception, M.D.   On: 03/26/2017 12:41   US Renal  Result Date: 03/27/2017 CLINICAL DATA:  83 year old male with acute kidney injury and oliguria. EXAM: RENAL / URINARY TRACT ULTRASOUND COMPLETE COMPARISON:  03/26/2017 CT and 10/17/2014 renal ultrasound FINDINGS: Right Kidney: Length: 11.2 cm. Cortical thinning and increased echogenicity noted with  multiple renal cysts. No definite solid mass. No hydronephrosis. Left Kidney: Length: 10.3 cm. Cortical thinning and increased echogenicity noted with multiple renal cysts. No definite solid mass. No hydronephrosis Bladder: Appears normal for degree of bladder distention. IMPRESSION: 1. No evidence of hydronephrosis 2. Bilateral renal cortical thinning and increased echogenicity compatible with medical renal disease. 3. Bilateral renal cysts. Electronically Signed   By: Margarette Canada M.D.   On: 03/27/2017 14:22   Dg Chest Port 1 View  Result Date: 03/27/2017 CLINICAL DATA:  Acute kidney injury. EXAM: PORTABLE CHEST 1 VIEW COMPARISON:  3/14/ 17 FINDINGS: Left chest wall AICD is noted with leads in the right atrial appendage and right ventricle. Moderate cardiac enlargement. Left pleural effusion and mild diffuse edema identified. IMPRESSION: Cardiac enlargement and mild CHF. Electronically Signed   By: Kerby Moors M.D.   On: 03/27/2017 21:51   Findings in OR  Purulent fluid upon peritoneal entry - cultures sent. Fat containing ventral hernia ~4x4cm which was included in midline incision but ultimately closed primarily as part of the fascial closure. Appendix and cecum completely stuck to retroperitoneum in the right lower quadrant. Lateral wall of cecum and base of appendix appeared diseased and necrotic which necessitated an ileocecectomy. Ascending colon and distal ileum were normal appearing and healthy.   ASSESSMENT / PLAN:  NEURO: Post sedation Somnolent but protecting airway  follows commands No focal deficits appreciated Pain management with Fentanyl IV 25-50 Dilaudid discontinued  CARDIAC: Required Neosynephrine during case Currently not on vasopressors Hemodynamically stable Has an  AICD  H/o CAD and CHF noted  PULMONARY: Extubated after OR and prior to arriving to ICU Currently on face mask with supplemental Oxygen  Wean as tolerated  ID: F/u cultures from OR Blood  cultures on 9/14 were negative Given appearance of abdomen in OR Continue Merrem IV LA 1.7  Endocrine: H/o IDDM Sliding scale coverage Blood glucose goals per ICU Glycemic control parameters  GI: NPO until cleared by Surgery Continues on Protonix IV for prophylaxis  Heme: hgb drop 10.8 to 8.5  Given Cardiac history  Hgb<8  Threshold for transfusion of PRBCs .Marland KitchenA POS Currently no signs of active bleeding DVT PPx-> SCDs  RENAL July 2018 Cr 2.76-> 9/13 3.6 peaked at 5.75 Lab Results  Component Value Date   CREATININE 4.49 (H) 03/27/2017   CREATININE 5.75 (H) 03/27/2017   CREATININE 5.29 (H) 03/27/2017  Acute on Chronic Kidney Injury Non anion gap metabolic acidosis  Strict Is and Os Potassium trending down 5.4->4.2 No EKG changes Lactic acid 1.7.  F/u CMP     .[name]    Code Status Orders        Start     Ordered   03/26/17 1852  Full code  Continuous     03/26/17 1851    Code Status History    Date Active  Date Inactive Code Status Order ID Comments User Context   05/08/2015  5:01 AM 05/11/2015  9:03 PM Full Code [id number]  Edwin Dada, MD Inpatient   04/09/2015 10:28 AM 04/10/2015  3:19 PM Full Code [id number]  [name], Keny M, MD Inpatient   04/08/2015  6:21 PM 04/09/2015 10:28 AM Full Code [id number]  Charlie Pitter, PA-C Inpatient   03/29/2015  9:23 AM 03/29/2015  7:49 PM Full Code [id number]  [name], Gabryel M, MD Inpatient   03/28/2015  2:28 PM 03/29/2015  9:23 AM Full Code [id number]  Isaiah Serge, NP Inpatient   10/16/2014 12:12 PM 10/17/2014  5:31 PM Full Code [id number]  Rogelia Mire, NP Inpatient     DISPOSITION: Admit to ICU  I have discussed the plan of care with the medical team (including consultants, NP/PA, RN)   Critical Care Time: 38 mins  Signed Dr Seward Carol Pulmonary Critical Care Locums Pulmonary and [place] Pager: [phone]  03/27/2017, 10:51 PM

## 2017-03-27 NOTE — Progress Notes (Signed)
Pharmacy Antibiotic Note  Rodney Farley is a 83 y.o. male admitted on 03/26/2017 with possible abscess secondary to perforated appendicitis.  Pharmacy has been consulted for meropenem dosing.  Pt received Zosyn 3.375gm IV x 1 and continued on 2.25 g every 8 hours.  Pt has PMH of CKD with baseline SCr ~ 2.7-2.9.  SCr increased from 3.7 to 5.29, with CrCl ~11. No urine output documented. WBC increased from 13.2 to 22.5. Talked with family medicine and increased concern with elevating WBC and clinical picture.   Plan: Meropenem 1000 mg every 24 hours Follow-up culture data, clinical progress, and renal function.  Height: 5\' 7"  (170.2 cm) Weight: 210 lb 12.2 oz (95.6 kg) IBW/kg (Calculated) : 66.1  Temp (24hrs), Avg:98.5 F (36.9 C), Min:98.1 F (36.7 C), Max:99.1 F (37.3 C)   Recent Labs Lab 03/25/17 1309 03/26/17 1512 03/26/17 1531 03/27/17 0705 03/27/17 1010  WBC 17.3* 13.2*  --  22.5*  --   CREATININE 3.60* 3.77*  --  5.29*  --   LATICACIDVEN  --   --  0.92  --  1.7    Estimated Creatinine Clearance: 11.7 mL/min (A) (by C-G formula based on SCr of 5.29 mg/dL (H)).    Allergies  Allergen Reactions  . Bee Venom Anaphylaxis  . Lyrica [Pregabalin] Other (See Comments)    hallucinations  . Prednisone Other (See Comments)    hallucinations  . Zocor [Simvastatin] Nausea Only and Other (See Comments)    Headache with brand name only.  Can take the generic.    Antimicrobials this admission: Zosyn 9/14 >>9/15 Meropenem 9/15>>  Microbiology results: Blood cx x2 9/14 >>  Thank you for allowing pharmacy to be a part of this patient's care.  Doylene Canard, PharmD Clinical Pharmacist  Phone: [phone] 03/27/2017 11:53 AM

## 2017-03-27 NOTE — Anesthesia Procedure Notes (Signed)
Procedure Name: Intubation Date/Time: 03/27/2017 5:50 PM Performed by: Clearnce Sorrel Pre-anesthesia Checklist: Patient identified, Emergency Drugs available, Suction available, Patient being monitored and Timeout performed Patient Re-evaluated:Patient Re-evaluated prior to induction Oxygen Delivery Method: Circle system utilized Preoxygenation: Pre-oxygenation with 100% oxygen Induction Type: IV induction and Cricoid Pressure applied Ventilation: Mask ventilation without difficulty and Oral airway inserted - appropriate to patient size Laryngoscope Size: Mac and 3 Grade View: Grade I Tube type: Subglottic suction tube Tube size: 7.5 mm Number of attempts: 1 Airway Equipment and Method: Stylet Placement Confirmation: ETT inserted through vocal cords under direct vision,  positive ETCO2 and breath sounds checked- equal and bilateral Secured at: 23 cm Tube secured with: Tape Dental Injury: Teeth and Oropharynx as per pre-operative assessment

## 2017-03-27 NOTE — Progress Notes (Addendum)
[facility] Progress Note Patient Name: Rodney Farley DOB: 09/12/1931 MRN: [id number]   Date of Service  03/27/2017  HPI/Events of Note  p-lap, perforated appy AKI with hyperkalemia -improved  eICU Interventions  Hydrate Change to fent , dc dilaudid Hypocalcemia -repleted      Intervention Category Evaluation Type: New Patient Evaluation  Shontelle Muska V. 03/27/2017, 10:27 PM

## 2017-03-27 NOTE — Anesthesia Preprocedure Evaluation (Signed)
Anesthesia Evaluation  Patient identified by MRN, date of birth, ID band Patient awake    Reviewed: Allergy & Precautions, NPO status , Patient's Chart, lab work & pertinent test results, reviewed documented beta blocker date and time   History of Anesthesia Complications Negative for: history of anesthetic complications  Airway Mallampati: II       Dental  (+) Dental Advisory Given   Pulmonary COPD, former smoker,    breath sounds clear to auscultation       Cardiovascular hypertension, Pt. on medications and Pt. on home beta blockers (-) angina+ CAD, + Past MI, + Cardiac Stents (3 stents) and + DOE  + pacemaker (for AV block) + Cardiac Defibrillator (has never shocked pt)  Rhythm:Regular Rate:Normal  '17 ECHO: EF 60-65%, valves ok   Neuro/Psych Anxiety Depression tremor TIA   GI/Hepatic Neg liver ROS, GERD  Medicated and Controlled,peritonitis   Endo/Other  diabetes (glu 99), Insulin DependentMorbid obesity  Renal/GU Renal InsufficiencyRenal disease (creat 5.75, K+5.4)     Musculoskeletal   Abdominal (+) + obese,   Peds  Hematology  (+) Blood dyscrasia (Hb 10.8), ,   Anesthesia Other Findings   Reproductive/Obstetrics                             Anesthesia Physical Anesthesia Plan  ASA: IV and emergent  Anesthesia Plan: General   Post-op Pain Management:    Induction: Intravenous  PONV Risk Score and Plan: 3 and Ondansetron, Midazolam and Treatment may vary due to age or medical condition  Airway Management Planned: Oral ETT  Additional Equipment: Arterial line  Intra-op Plan:   Post-operative Plan: Possible Post-op intubation/ventilation  Informed Consent: I have reviewed the patients History and Physical, chart, labs and discussed the procedure including the risks, benefits and alternatives for the proposed anesthesia with the patient or authorized representative who has  indicated his/her understanding and acceptance.   Dental advisory given  Plan Discussed with: CRNA and Surgeon  Anesthesia Plan Comments: (Plan routine monitors, A line, GETA with post op ventilated)        Anesthesia Quick Evaluation

## 2017-03-27 NOTE — Anesthesia Procedure Notes (Deleted)
Procedure Name: Intubation Date/Time: 03/27/2017 5:50 PM Performed by: Claris Che Pre-anesthesia Checklist: Patient identified, Emergency Drugs available, Suction available, Patient being monitored and Timeout performed Patient Re-evaluated:Patient Re-evaluated prior to induction Oxygen Delivery Method: Circle system utilized Preoxygenation: Pre-oxygenation with 100% oxygen Induction Type: IV induction Ventilation: Mask ventilation without difficulty Laryngoscope Size: Mac and 3 Grade View: Grade II Tube type: Oral Tube size: 7.5 mm Number of attempts: 1 Airway Equipment and Method: Stylet Placement Confirmation: ETT inserted through vocal cords under direct vision,  positive ETCO2 and breath sounds checked- equal and bilateral Secured at: 24 cm Tube secured with: Tape Dental Injury: Teeth and Oropharynx as per pre-operative assessment

## 2017-03-27 NOTE — Progress Notes (Signed)
Notified MD of Patient having PO medications scheduled but being NPO status. MD states hold PO meds for now.

## 2017-03-27 NOTE — Progress Notes (Signed)
  Request seen for CT drain for ruptured appendix.  Dr. Corrie Mckusick reviewed CT. Appears to be a phlegmon, unable to drain.  Recommend IV antibiotics.  Farzana Koci S Oscar Hank PA-C 03/27/2017 8:32 AM

## 2017-03-27 NOTE — Progress Notes (Signed)
Patient leaving unit via bed to OR. Consent has been signed along with Blood consent. Lab called to say it will be a while for blood as patient needs to be checked for a special antibody. CHG bath given before transport. Patient has NPO since Thursday 9/13. Patients family accompanying to OR waiting room.

## 2017-03-27 NOTE — Progress Notes (Signed)
Patient will go to ICU after surgery.

## 2017-03-27 NOTE — Transfer of Care (Signed)
Immediate Anesthesia Transfer of Care Note  Patient: Rodney Farley  Procedure(s) Performed: Procedure(s): EXPLORATORY LAPAROTOMY (N/A) APPENDECTOMY (N/A) POSSIBLE ILEOCECETOMY (N/A) POSSIBLE LAPAROSCOPIC RIGHT HEMI COLECTOMY (Right) POSSIBLE OSTOMY (N/A)  Patient Location: PACU  Anesthesia Type:General  Level of Consciousness: sedated, drowsy, patient cooperative and responds to stimulation  Airway & Oxygen Therapy: Patient Spontanous Breathing and Patient connected to face mask oxygen  Post-op Assessment: Report given to RN, Post -op Vital signs reviewed and stable and Patient moving all extremities X 4  Post vital signs: Reviewed and stable  Last Vitals:  Vitals:   03/27/17 1309 03/27/17 2043  BP: 120/74   Pulse:  83  Resp: 18   Temp:  (!) 36.3 C  SpO2: 92%     Last Pain:  Vitals:   03/27/17 1137  TempSrc:   PainSc: 0-No pain      Patients Stated Pain Goal: 2 (22/63/33 5456)  Complications: No apparent anesthesia complications

## 2017-03-27 NOTE — Progress Notes (Signed)
D;AICD metronic interrogated @ 2130, result came back by Fax, send to [place] via tube system

## 2017-03-27 NOTE — Progress Notes (Signed)
Family Umer Harig [phone]

## 2017-03-27 NOTE — Anesthesia Postprocedure Evaluation (Signed)
Anesthesia Post Note  Patient: Rodney Farley  Procedure(s) Performed: Procedure(s) (LRB): EXPLORATORY LAPAROTOMY (N/A) ILEOCECECTOMY (N/A)     Patient location during evaluation: PACU Anesthesia Type: General Level of consciousness: sedated and patient cooperative Pain management: pain level controlled Vital Signs Assessment: post-procedure vital signs reviewed and stable Respiratory status: spontaneous breathing, nonlabored ventilation, respiratory function stable and patient connected to face mask oxygen Cardiovascular status: blood pressure returned to baseline and stable Postop Assessment: no apparent nausea or vomiting Anesthetic complications: no    Last Vitals:  Vitals:   03/27/17 2154 03/27/17 2215  BP: (!) 106/56 (!) 111/46  Pulse: 72 75  Resp: (!) 25 (!) 32  Temp: (!) 36.4 C 36.6 C  SpO2: 95% 95%    Last Pain:  Vitals:   03/27/17 2154  TempSrc:   PainSc: Asleep                 Miyako Oelke,E. Cassia Fein

## 2017-03-28 ENCOUNTER — Encounter (HOSPITAL_COMMUNITY): Payer: Self-pay | Admitting: Surgery

## 2017-03-28 DIAGNOSIS — R34 Anuria and oliguria: Secondary | ICD-10-CM

## 2017-03-28 DIAGNOSIS — K352 Acute appendicitis with generalized peritonitis: Secondary | ICD-10-CM

## 2017-03-28 LAB — BASIC METABOLIC PANEL
ANION GAP: 10 (ref 5–15)
BUN: 105 mg/dL — ABNORMAL HIGH (ref 6–20)
CALCIUM: 8.1 mg/dL — AB (ref 8.9–10.3)
CO2: 18 mmol/L — AB (ref 22–32)
Chloride: 113 mmol/L — ABNORMAL HIGH (ref 101–111)
Creatinine, Ser: 5.43 mg/dL — ABNORMAL HIGH (ref 0.61–1.24)
GFR calc non Af Amer: 9 mL/min — ABNORMAL LOW (ref >60)
GFR, EST AFRICAN AMERICAN: 10 mL/min — AB (ref >60)
Glucose, Bld: 102 mg/dL — ABNORMAL HIGH (ref 65–99)
Potassium: 5.2 mmol/L — ABNORMAL HIGH (ref 3.5–5.1)
Sodium: 141 mmol/L (ref 135–145)

## 2017-03-28 LAB — GLUCOSE, CAPILLARY
GLUCOSE-CAPILLARY: 90 mg/dL (ref 65–99)
GLUCOSE-CAPILLARY: 103 mg/dL — AB (ref 65–99)
GLUCOSE-CAPILLARY: 86 mg/dL (ref 65–99)
GLUCOSE-CAPILLARY: 90 mg/dL (ref 65–99)
Glucose-Capillary: 104 mg/dL — ABNORMAL HIGH (ref 65–99)
Glucose-Capillary: 107 mg/dL — ABNORMAL HIGH (ref 65–99)
Glucose-Capillary: 109 mg/dL — ABNORMAL HIGH (ref 65–99)
Glucose-Capillary: 112 mg/dL — ABNORMAL HIGH (ref 65–99)

## 2017-03-28 LAB — MRSA PCR SCREENING: MRSA BY PCR: NEGATIVE

## 2017-03-28 LAB — CBC
HEMATOCRIT: 30.6 % — AB (ref 39.0–52.0)
HEMOGLOBIN: 9.9 g/dL — AB (ref 13.0–17.0)
MCH: 30.2 pg (ref 26.0–34.0)
MCHC: 32.4 g/dL (ref 30.0–36.0)
MCV: 93.3 fL (ref 78.0–100.0)
Platelets: 234 10*3/uL (ref 150–400)
RBC: 3.28 MIL/uL — ABNORMAL LOW (ref 4.22–5.81)
RDW: 14.7 % (ref 11.5–15.5)
WBC: 17.2 10*3/uL — AB (ref 4.0–10.5)

## 2017-03-28 MED ORDER — FENTANYL 40 MCG/ML IV SOLN: INTRAVENOUS | Status: DC

## 2017-03-28 MED ORDER — ONDANSETRON HCL 4 MG/2ML IJ SOLN
4.0000 mg | Freq: Four times a day (QID) | INTRAMUSCULAR | Status: DC | PRN
  Administered 2017-03-28: 4 mg via INTRAVENOUS
  Filled 2017-03-28: qty 2

## 2017-03-28 MED ORDER — FENTANYL CITRATE (PF) 100 MCG/2ML IJ SOLN
50.0000 ug | Freq: Once | INTRAMUSCULAR | Status: AC
  Administered 2017-03-28: 50 ug via INTRAVENOUS

## 2017-03-28 MED ORDER — DIPHENHYDRAMINE HCL 50 MG/ML IJ SOLN: 12.5000 mg | Freq: Four times a day (QID) | INTRAMUSCULAR | Status: DC | PRN

## 2017-03-28 MED ORDER — DIPHENHYDRAMINE HCL 12.5 MG/5ML PO ELIX: 12.5000 mg | ORAL_SOLUTION | Freq: Four times a day (QID) | ORAL | Status: DC | PRN

## 2017-03-28 MED ORDER — HEPARIN SODIUM (PORCINE) 5000 UNIT/ML IJ SOLN
5000.0000 [IU] | Freq: Three times a day (TID) | INTRAMUSCULAR | Status: DC
  Administered 2017-03-28 – 2017-04-06 (×28): 5000 [IU] via SUBCUTANEOUS
  Filled 2017-03-28 (×28): qty 1

## 2017-03-28 MED ORDER — SODIUM CHLORIDE 0.9% FLUSH: 9.0000 mL | INTRAVENOUS | Status: DC | PRN

## 2017-03-28 MED ORDER — NALOXONE HCL 0.4 MG/ML IJ SOLN: 0.4000 mg | INTRAMUSCULAR | Status: DC | PRN

## 2017-03-28 MED ORDER — ACETAMINOPHEN 325 MG PO TABS
650.0000 mg | ORAL_TABLET | Freq: Four times a day (QID) | ORAL | Status: DC
  Administered 2017-03-29 – 2017-04-06 (×33): 650 mg via ORAL
  Filled 2017-03-28 (×34): qty 2

## 2017-03-28 MED ORDER — FENTANYL CITRATE (PF) 100 MCG/2ML IJ SOLN
INTRAMUSCULAR | Status: AC
  Filled 2017-03-28: qty 2

## 2017-03-28 MED ORDER — ONDANSETRON HCL 4 MG/2ML IJ SOLN: 4.0000 mg | Freq: Four times a day (QID) | INTRAMUSCULAR | Status: DC | PRN

## 2017-03-28 MED ORDER — FENTANYL 40 MCG/ML IV SOLN
INTRAVENOUS | Status: DC
  Administered 2017-03-28: 100 ug via INTRAVENOUS
  Administered 2017-03-28: 170 ug via INTRAVENOUS
  Administered 2017-03-28: 1000 ug via INTRAVENOUS
  Administered 2017-03-29: 30 ug via INTRAVENOUS
  Administered 2017-03-29: 20 ug via INTRAVENOUS
  Administered 2017-03-29: 30 ug via INTRAVENOUS
  Filled 2017-03-28: qty 25

## 2017-03-28 NOTE — Progress Notes (Signed)
..   Name: Rodney Farley MRN: [id number] DOB: 04/13/1932    ADMISSION DATE:  03/26/2017 CONSULTATION DATE:  03/27/17  REFERRING MD : Mingo Amber MD- [facility]. White, MD  CHIEF COMPLAINT:  2 week history of abdominal pain in RLQ   BRIEF PATIENT DESCRIPTION: 83 y.o. male presenting with RLQ abdominal pain found to have ruptured appendix with abscess. S/P ex lap, appendectomy midline fascia closed and skin left open with ABD pads secured by tape.  Briefly required neosynephrine for OR.  Extubated and returned to ICU / off pressors.    SUBJECTIVE: PT is tearful, states "this is the most excruciating pain".  He is begging for water. Denies SOB.  States the O2 mask is bothering him.    VITAL SIGNS: Temp:  [97.3 F (36.3 C)-98.4 F (36.9 C)] 98.4 F (36.9 C) (09/16 0800) Pulse Rate:  [69-83] 80 (09/16 0800) Resp:  [18-33] 24 (09/16 0800) BP: (100-120)/(37-76) 107/46 (09/16 0800) SpO2:  [92 %-98 %] 97 % (09/16 0800) Arterial Line BP: (101-130)/(36-51) 117/49 (09/16 0800) Weight:  [223 lb 8.7 oz (101.4 kg)] 223 lb 8.7 oz (101.4 kg) (09/15 2215)  PHYSICAL EXAMINATION: General: well developed elderly male in NAD, lying in bed  HEENT: MM pink/dry, no jvd PSY: tearful  Neuro:  AAOx4, speech clear, MAE CV: s1s2 rrr, no m/r/g PULM: even/non-labored, lungs bilaterally diminished L>R, no crackles  GI: midline abdominal dressing c/d/i, tender to palpation  Extremities: warm/dry, no edema  Skin: no rashes or lesions   Recent Labs Lab 03/27/17 1531 03/27/17 2120 03/28/17 0413  NA 138 143 141  K 5.4* 4.2 5.2*  CL 105 119* 113*  CO2 19* 15* 18*  BUN 97* 82* 105*  CREATININE 5.75* 4.49* 5.43*  GLUCOSE 97 89 102*    Recent Labs Lab 03/27/17 1531 03/27/17 2120 03/28/17 0413  HGB 10.8* 8.5* 9.9*  HCT 33.5* 26.1* 30.6*  WBC 23.8* 14.8* 17.2*  PLT 240 206 234   Ct Abdomen Pelvis Wo Contrast  Result Date: 03/26/2017 CLINICAL  DATA:  Acute right-sided abdominal pain. EXAM: CT ABDOMEN AND PELVIS WITHOUT CONTRAST TECHNIQUE: Multidetector CT imaging of the abdomen and pelvis was performed following the standard protocol without IV contrast. COMPARISON:  None. FINDINGS: Lower chest: No acute abnormality. Hepatobiliary: No focal liver abnormality is seen. Status post cholecystectomy. No biliary dilatation. Pancreas: Unremarkable. No pancreatic ductal dilatation or surrounding inflammatory changes. Spleen: Normal in size without focal abnormality. Adrenals/Urinary Tract: Adrenal glands appear normal. Bilateral renal cysts are noted, some of which are hyperdense in appearance. No hydronephrosis or renal obstruction is noted. No renal or ureteral calculi are noted. Urinary bladder is unremarkable. Stomach/Bowel: The stomach appears normal. There is no evidence of bowel obstruction. There is a large rounded abnormality seen adjacent to the inferior portion of the cecum that measures 8.3 x 5.6 cm. Mild surrounding inflammation is noted. This is concerning for possible abscess or phlegmon status post ruptured appendicitis. There appears to be an enlarged and inflamed appendix in this area, although it is difficult to be certain given the degree of inflammation in the area. Significant wall thickening of the terminal ileum is noted which may be secondary to the inflammation. Vascular/Lymphatic: Aortic atherosclerosis. No enlarged abdominal or pelvic lymph nodes. Reproductive: Mild prostatic enlargement is noted. Other: No  abdominal wall hernia or abnormality. No abdominopelvic ascites. Musculoskeletal: No acute or significant osseous findings. IMPRESSION: 8.3 x 5.6 cm rounded complex abnormality seen adjacent to inferior portion of this cecum with mild surrounding inflammation. This is concerning for possible abscess or phlegmon related to ruptured appendicitis. Possible enlarged and inflamed appendix may be seen in this area, although it is  difficult to be certain given the degree of inflammation present. Significant wall thickening of the terminal ileum is noted most likely secondary to adjacent inflammation. Critical Value/emergent results were called by telephone at the time of interpretation on 03/26/2017 at 12:40 pm to Dr. Garret Reddish , who verbally acknowledged these results. Aortic atherosclerosis. Mild prostatic enlargement. Multiple exophytic cysts are seen involving both kidneys, several which appear to be hyperdense and therefore most likely complex cysts. Ultrasound is recommended to rule out potential masses. Electronically Signed   By: Marijo Conception, M.D.   On: 03/26/2017 12:41   US Renal  Result Date: 03/27/2017 CLINICAL DATA:  83 year old male with acute kidney injury and oliguria. EXAM: RENAL / URINARY TRACT ULTRASOUND COMPLETE COMPARISON:  03/26/2017 CT and 10/17/2014 renal ultrasound FINDINGS: Right Kidney: Length: 11.2 cm. Cortical thinning and increased echogenicity noted with multiple renal cysts. No definite solid mass. No hydronephrosis. Left Kidney: Length: 10.3 cm. Cortical thinning and increased echogenicity noted with multiple renal cysts. No definite solid mass. No hydronephrosis Bladder: Appears normal for degree of bladder distention. IMPRESSION: 1. No evidence of hydronephrosis 2. Bilateral renal cortical thinning and increased echogenicity compatible with medical renal disease. 3. Bilateral renal cysts. Electronically Signed   By: Margarette Canada M.D.   On: 03/27/2017 14:22   Dg Chest Port 1 View  Result Date: 03/27/2017 CLINICAL DATA:  Acute kidney injury. EXAM: PORTABLE CHEST 1 VIEW COMPARISON:  3/14/ 17 FINDINGS: Left chest wall AICD is noted with leads in the right atrial appendage and right ventricle. Moderate cardiac enlargement. Left pleural effusion and mild diffuse edema identified. IMPRESSION: Cardiac enlargement and mild CHF. Electronically Signed   By: Kerby Moors M.D.   On: 03/27/2017 21:51    OR Findings: Purulent fluid upon peritoneal entry - cultures sent. Fat containing ventral hernia ~4x4cm which was included in midline incision but ultimately closed primarily as part of the fascial closure. Appendix and cecum completely stuck to retroperitoneum in the right lower quadrant. Lateral wall of cecum and base of appendix appeared diseased and necrotic which necessitated an ileocecectomy. Ascending colon and distal ileum were normal appearing and healthy.  SIGNIFICANT EVENTS  9/14  Admit with abd pain 9/15  s/p ex lap with appendectomy and wash out  STUDIES:  CT ABD/Pelvis 9/14 >> 8.3 x 5.6 cm rounded complex abnormality seen adjacent to inferior portion of cecum with mild surrounding inflammation, enlarged and inflamed appendix, significant wall thickening of the terminal ileum, multiple exophytic cysts in both kidneys Renal US 9/14 >> no evidence of hydronephrosis, bilateral renal cysts  CULTURES: BCx2 9/14 >>  ABD Fluid (OR) 9/15 >>   ANTIBIOTICS: Meropenem 9/15 >>   ASSESSMENT / PLAN:  NEURO: A: Post-operative Pain At Risk Delirium   P: Change to reduced dose fentanyl PCA for pain control  PT efforts as above  Promote sleep wake cycle   CARDIAC: A: Hypotension - resolved  Hx AICD, CAD, CHF, HLD P:  SDU/ICU monitoring  NS @ 100 ml/hr  Monitor for EKG changes  Continue lipitor once able to take PO's   PULMONARY: A: At Risk Atelectasis  P:  Wean O2 for sats > 92% Pulmonary hygiene - IS, mobilize Follow intermittent CXR   ID: A: Perforated Appendix with Peritonitis  P:  ABX as above  Follow cultures   Endocrine: A: IDDM  P: Continue SSI  Lantus 10 untis QD > monitor for hypoglycemia given NPO status  GI: A: Post-Op Ex-Lap with Appendectomy & Wash Out  P: NPO until seen by CCS  Heme: A: Anemia  P: Trend CBC  Goal Hgb >8 given cardiac hx  Monitor for bleeding  SCD's for DVT prophylaxis   RENAL A:  Acute on Chronic Kidney Injury  - July 2018 Cr 2.76-> 9/13 3.6 peaked at 5.75 Non anion gap metabolic acidosis P: Trend BMP / urinary output Replace electrolytes as indicated Avoid nephrotoxic agents, ensure adequate renal perfusion IVF as above     Noe Gens, NP-C [facility] Pgr: [phone] or if no answer [phone] 03/28/2017, 10:22 AM

## 2017-03-28 NOTE — Progress Notes (Signed)
[facility] Progress Note  Subjective: went to OR this am, ileocecectomy, no ostomy, cleaned out; fascia closed and skin left open.  Pt up in chair already.  No SOB, no CP.    Vitals:   03/28/17 1600 03/28/17 1625 03/28/17 1700 03/28/17 1800  BP: (!) 130/57  (!) 131/59 (!) 124/56  Pulse: 86  86 74  Resp: (!) 24  (!) 36 (!) 21  Temp:  98.3 F (36.8 C)    TempSrc:  Oral    SpO2: 95%  94% 96%  Weight:      Height:        Inpatient medications: . acetaminophen  650 mg Oral Q6H  . atorvastatin  40 mg Oral QHS  . fentaNYL   Intravenous Q4H  . heparin subcutaneous  5,000 Units Subcutaneous Q8H  . insulin aspart  0-9 Units Subcutaneous Q4H  . insulin glargine  10 Units Subcutaneous Daily  . mouth rinse  15 mL Mouth Rinse BID  . pantoprazole (PROTONIX) IV  40 mg Intravenous Q24H  . polyvinyl alcohol  1 drop Both Eyes TID  . sertraline  150 mg Oral QHS  . tamsulosin  0.4 mg Oral QPC supper   . sodium chloride 100 mL/hr at 03/28/17 1800  . meropenem (MERREM) IV Stopped (03/28/17 1234)  . methocarbamol (ROBAXIN)  IV     diphenhydrAMINE **OR** diphenhydrAMINE, methocarbamol (ROBAXIN)  IV, naloxone **AND** sodium chloride flush, ondansetron (ZOFRAN) IV, prochlorperazine  Exam: Gen awake, up in chair No jvd or bruits Chest clear bilat to bases RRR soft sem , no RG Abd dressing in place, not removed, dec'd BS GU normal male MS no joint effusions or deformity Ext trace LE edema Neuro is alert, Ox 3 , nf   UA > 100 prot, o/w negative   Date                Creat               eGFR 2010- 2015      1.5- 1.9 2016                1.8-  2.5 2017                1.9- 2.9            23- 09 Feb 2017          2.91        Impression: 1.  Acute on CRF - creat remains low 5's, not uremic, no indication HD yet 2.  Ruptured appendix - sp OR today 3.  Ischemic CM/ hx CAD sp mult stents/ EF 35-40% last echo 4.  DM on insulin 5.  Volume - will stop IVF's, hx CHF 6.   PPM   Plan - recheck labs in am   Kelly Splinter MD [place] pager [phone]   03/28/2017, 6:41 PM    Recent Labs Lab 03/27/17 1531 03/27/17 2120 03/28/17 0413  NA 138 143 141  K 5.4* 4.2 5.2*  CL 105 119* 113*  CO2 19* 15* 18*  GLUCOSE 97 89 102*  BUN 97* 82* 105*  CREATININE 5.75* 4.49* 5.43*  CALCIUM 8.6* 6.2* 8.1*    Recent Labs Lab 03/25/17 1309 03/26/17 1512 03/27/17 2120  AST 33 22 59*  ALT 37 34 33  ALKPHOS 115 114 52  BILITOT 0.3 0.7 0.7  PROT 6.4 7.2 4.4*  ALBUMIN 3.7 3.4* 2.0*    Recent Labs Lab 03/25/17  1309  03/27/17 1531 03/27/17 2120 03/28/17 0413  WBC 17.3*  < > 23.8* 14.8* 17.2*  NEUTROABS 14.0*  --   --   --   --   HGB 11.5*  < > 10.8* 8.5* 9.9*  HCT 35.7*  < > 33.5* 26.1* 30.6*  MCV 92.7  < > 92.3 93.2 93.3  PLT 301.0  < > 240 206 234  < > = values in this interval not displayed. Iron/TIBC/Ferritin/ %Sat No results found for: IRON, TIBC, FERRITIN, IRONPCTSAT

## 2017-03-28 NOTE — Progress Notes (Signed)
Patient ID: Rodney Farley, male   DOB: [birth year], 83 y.o.   MRN: [id number]   Acute Care Surgery Service Progress Note:    Chief Complaint/Subjective: C/o abd pain and some nausea.  Only able to do 250 on IS!!  Objective: Vital signs in last 24 hours: Temp:  [97.3 F (36.3 C)-98.4 F (36.9 C)] 98.4 F (36.9 C) (09/16 0800) Pulse Rate:  [69-83] 81 (09/16 0900) Resp:  [18-33] 23 (09/16 0900) BP: (100-120)/(37-76) 103/48 (09/16 0900) SpO2:  [92 %-98 %] 97 % (09/16 0900) Arterial Line BP: (101-136)/(36-55) 136/55 (09/16 0900) Weight:  [101.4 kg (223 lb 8.7 oz)] 101.4 kg (223 lb 8.7 oz) (09/15 2215) Last BM Date: 03/26/17  Intake/Output from previous day: 09/15 0701 - 09/16 0700 In: 4000 [I.V.:3650; IV Piggyback:350] Out: 1010 [Urine:960; Blood:50] Intake/Output this shift: Total I/O In: 250 [I.V.:250] Out: 80 [Urine:80]  Lungs: cta, nonlabored but not taking deep breaths, no resp distress  Cardiovascular: reg  Abd: soft, obese, mild distension; dressing intact  Extremities: no edema, +SCDs  Neuro: alert, nonfocal  Lab Results: CBC   Recent Labs  03/27/17 2120 03/28/17 0413  WBC 14.8* 17.2*  HGB 8.5* 9.9*  HCT 26.1* 30.6*  PLT 206 234   BMET  Recent Labs  03/27/17 2120 03/28/17 0413  NA 143 141  K 4.2 5.2*  CL 119* 113*  CO2 15* 18*  GLUCOSE 89 102*  BUN 82* 105*  CREATININE 4.49* 5.43*  CALCIUM 6.2* 8.1*   LFT Hepatic Function Latest Ref Rng & Units 03/27/2017 03/26/2017 03/25/2017  Total Protein 6.5 - 8.1 g/dL 4.4(L) 7.2 6.4  Albumin 3.5 - 5.0 g/dL 2.0(L) 3.4(L) 3.7  AST 15 - 41 U/L 59(H) 22 33  ALT 17 - 63 U/L 33 34 37  Alk Phosphatase 38 - 126 U/L 52 114 115  Total Bilirubin 0.3 - 1.2 mg/dL 0.7 0.7 0.3  Bilirubin, Direct 0.1 - 0.5 mg/dL - - -   PT/INR No results for input(s): LABPROT, INR in the last 72 hours. ABG No results for input(s): PHART, HCO3 in the last 72 hours.  Invalid input(s): PCO2,  PO2  Studies/Results:  Anti-infectives: Anti-infectives    Start     Dose/Rate Route Frequency Ordered Stop   03/27/17 1230  meropenem (MERREM) 1 g in sodium chloride 0.9 % 100 mL IVPB     1 g 200 mL/hr over 30 Minutes Intravenous Every 24 hours 03/27/17 1153     03/26/17 2200  piperacillin-tazobactam (ZOSYN) IVPB 2.25 g  Status:  Discontinued     2.25 g 100 mL/hr over 30 Minutes Intravenous Every 8 hours 03/26/17 1914 03/27/17 1153   03/26/17 1600  piperacillin-tazobactam (ZOSYN) IVPB 3.375 g     3.375 g 100 mL/hr over 30 Minutes Intravenous  Once 03/26/17 1555 03/26/17 1811      Medications: Scheduled Meds: . atorvastatin  40 mg Oral QHS  . heparin subcutaneous  5,000 Units Subcutaneous Q8H  . insulin aspart  0-9 Units Subcutaneous Q4H  . insulin glargine  10 Units Subcutaneous Daily  . mouth rinse  15 mL Mouth Rinse BID  . pantoprazole (PROTONIX) IV  40 mg Intravenous Q24H  . polyvinyl alcohol  1 drop Both Eyes TID  . sertraline  150 mg Oral QHS  . tamsulosin  0.4 mg Oral QPC supper   Continuous Infusions: . sodium chloride 125 mL/hr at 03/28/17 0900  . meropenem (MERREM) IV    . methocarbamol (ROBAXIN)  IV     PRN  Meds:.fentaNYL (SUBLIMAZE) injection, methocarbamol (ROBAXIN)  IV, ondansetron (ZOFRAN) IV, prochlorperazine  Assessment/Plan: Patient Active Problem List   Diagnosis Date Noted  . Oliguria   . Intra-abdominal abscess ([place])   . Ruptured appendicitis 03/26/2017  . Marital stress 02/18/2017  . Current use of long term anticoagulation - Plavix 06/10/2016  . Trigger finger 06/10/2016  . BPH associated with nocturia 04/27/2016  . Diabetic peripheral neuropathy ([place])   . COPD (chronic obstructive pulmonary disease) ([place])   . Bell's palsy   . Gout 06/26/2015  . AKI (acute kidney injury) ([place])   . Chronic systolic CHF (congestive heart failure) ([place]) 04/08/2015  . Diverticulitis 04/08/2015  . DOE (dyspnea on exertion), due to significant CAD 03/28/2015  .  Chronic kidney disease (CKD), stage IV (severe) ([place]) 03/04/2015  . Coronary artery disease involving native coronary artery of native heart with angina pectoris ([place]) 09/13/2014  . GERD (gastroesophageal reflux disease) 08/07/2012  . Depression 08/05/2012  . Sleep apnea 08/05/2012  . Uncontrolled type 2 diabetes mellitus with stage 3 chronic kidney disease, with long-term current use of insulin ([place]) 08/05/2012  . HTN (hypertension) 08/05/2012  . Ischemic cardiomyopathy 11/03/2011  . Atrioventricular block, complete ([place])   . Obesity   . Pacemaker    s/p Procedure(s): EXPLORATORY LAPAROTOMY ILEOCECECTOMY 03/27/2017 Dr Dema Severin  Can have sips of liquids Needs aggressive pulm toilet Will put on scheduled tylenol to aid wit pain control Start chemical vte prophylaxis Start dressing change Pt eval Cont IV abx for intra-abd contamination Can dc A line from our perspective  Disposition:  LOS: 2 days    Leighton Ruff. Redmond Pulling, MD, FACS General, Bariatric, & Minimally Invasive Surgery [phone] [facility]

## 2017-03-28 NOTE — Progress Notes (Signed)
FPTS Social Note  Patient transferred to ICU post op. Appreciate excellent care by CCM. Family Medicine Teaching Service will follow along peripherally and assume care when patient stable for transfer to floor.  Bufford Lope, DO 03/28/2017, 7:18 AM PGY-2, [place] Service pager [phone]

## 2017-03-29 ENCOUNTER — Telehealth: Payer: Self-pay | Admitting: Cardiology

## 2017-03-29 ENCOUNTER — Inpatient Hospital Stay (HOSPITAL_COMMUNITY): Payer: PPO

## 2017-03-29 DIAGNOSIS — I959 Hypotension, unspecified: Secondary | ICD-10-CM

## 2017-03-29 LAB — COMPREHENSIVE METABOLIC PANEL
ALT: 34 U/L (ref 17–63)
ANION GAP: 11 (ref 5–15)
AST: 57 U/L — AB (ref 15–41)
Albumin: 2.3 g/dL — ABNORMAL LOW (ref 3.5–5.0)
Alkaline Phosphatase: 78 U/L (ref 38–126)
BUN: 114 mg/dL — ABNORMAL HIGH (ref 6–20)
CHLORIDE: 116 mmol/L — AB (ref 101–111)
CO2: 16 mmol/L — AB (ref 22–32)
Calcium: 8.5 mg/dL — ABNORMAL LOW (ref 8.9–10.3)
Creatinine, Ser: 5.17 mg/dL — ABNORMAL HIGH (ref 0.61–1.24)
GFR calc non Af Amer: 9 mL/min — ABNORMAL LOW (ref >60)
GFR, EST AFRICAN AMERICAN: 11 mL/min — AB (ref >60)
Glucose, Bld: 122 mg/dL — ABNORMAL HIGH (ref 65–99)
POTASSIUM: 4.9 mmol/L (ref 3.5–5.1)
Sodium: 143 mmol/L (ref 135–145)
Total Bilirubin: 0.5 mg/dL (ref 0.3–1.2)
Total Protein: 5.5 g/dL — ABNORMAL LOW (ref 6.5–8.1)

## 2017-03-29 LAB — GLUCOSE, CAPILLARY
GLUCOSE-CAPILLARY: 107 mg/dL — AB (ref 65–99)
GLUCOSE-CAPILLARY: 109 mg/dL — AB (ref 65–99)
GLUCOSE-CAPILLARY: 107 mg/dL — AB (ref 65–99)
Glucose-Capillary: 114 mg/dL — ABNORMAL HIGH (ref 65–99)
Glucose-Capillary: 108 mg/dL — ABNORMAL HIGH (ref 65–99)
Glucose-Capillary: 97 mg/dL (ref 65–99)

## 2017-03-29 LAB — CBC
HCT: 31.6 % — ABNORMAL LOW (ref 39.0–52.0)
Hemoglobin: 10.3 g/dL — ABNORMAL LOW (ref 13.0–17.0)
MCH: 30 pg (ref 26.0–34.0)
MCHC: 32.6 g/dL (ref 30.0–36.0)
MCV: 92.1 fL (ref 78.0–100.0)
PLATELETS: 261 10*3/uL (ref 150–400)
RBC: 3.43 MIL/uL — ABNORMAL LOW (ref 4.22–5.81)
RDW: 15.1 % (ref 11.5–15.5)
WBC: 19.5 10*3/uL — AB (ref 4.0–10.5)

## 2017-03-29 MED ORDER — METOPROLOL TARTRATE 5 MG/5ML IV SOLN
5.0000 mg | Freq: Four times a day (QID) | INTRAVENOUS | Status: DC
  Administered 2017-03-29 – 2017-04-01 (×12): 5 mg via INTRAVENOUS
  Filled 2017-03-29 (×12): qty 5

## 2017-03-29 MED ORDER — NALOXONE HCL 0.4 MG/ML IJ SOLN: 0.4000 mg | INTRAMUSCULAR | Status: DC | PRN

## 2017-03-29 MED ORDER — SODIUM CHLORIDE 0.9% FLUSH: 9.0000 mL | INTRAVENOUS | Status: DC | PRN

## 2017-03-29 MED ORDER — ONDANSETRON HCL 4 MG/2ML IJ SOLN: 4.0000 mg | Freq: Four times a day (QID) | INTRAMUSCULAR | Status: DC | PRN

## 2017-03-29 MED ORDER — DIPHENHYDRAMINE HCL 50 MG/ML IJ SOLN: 12.5000 mg | Freq: Four times a day (QID) | INTRAMUSCULAR | Status: DC | PRN

## 2017-03-29 MED ORDER — FENTANYL 40 MCG/ML IV SOLN
INTRAVENOUS | Status: DC
  Administered 2017-03-29: 0 ug via INTRAVENOUS
  Administered 2017-03-29: 50 ug via INTRAVENOUS
  Administered 2017-03-29: 40 ug via INTRAVENOUS
  Administered 2017-03-30: 30 ug via INTRAVENOUS
  Administered 2017-03-30: 0 ug via INTRAVENOUS
  Administered 2017-03-30: 10 ug via INTRAVENOUS
  Administered 2017-03-30: 30 ug via INTRAVENOUS
  Administered 2017-03-31: 0 ug via INTRAVENOUS
  Filled 2017-03-29: qty 25

## 2017-03-29 MED ORDER — DIPHENHYDRAMINE HCL 12.5 MG/5ML PO ELIX: 12.5000 mg | ORAL_SOLUTION | Freq: Four times a day (QID) | ORAL | Status: DC | PRN

## 2017-03-29 MED ORDER — FUROSEMIDE 10 MG/ML IJ SOLN
60.0000 mg | Freq: Three times a day (TID) | INTRAMUSCULAR | Status: AC
  Administered 2017-03-29 (×2): 60 mg via INTRAVENOUS
  Filled 2017-03-29 (×2): qty 6

## 2017-03-29 NOTE — Evaluation (Signed)
Physical Therapy Evaluation Patient Details Name: Rodney Farley MRN: [id number] DOB: 1931-07-17 Today's Date: 03/29/2017   History of Present Illness  83 y.o. male presenting with RLQ abdominal pain found to have ruptured appendix with abscess. S/P ex lap, appendectomy midline fascia closed and skin left open with ABD pads secured by tape.  Briefly required neosynephrine for OR.  Extubated and returned to ICU / off pressors.   Clinical Impression  Patient is s/p above surgery resulting in functional limitations due to the deficits listed below (see PT Problem List). Pt is extremely limited by pain and fear of pain with movement as such he moves very slowly putting him at increased risk of falls. Pt currently, maxA for bed mobility and transfers. Patient will benefit from skilled PT to increase their independence and safety with mobility to allow discharge to the venue listed below.       Follow Up Recommendations SNF    Equipment Recommendations  Other (comment) (to be determined at next venue)    Recommendations for Other Services OT consult     Precautions / Restrictions Precautions Precautions: None Restrictions Weight Bearing Restrictions: No      Mobility  Bed Mobility Overal bed mobility: Needs Assistance Bed Mobility: Supine to Sit     Supine to sit: Max assist     General bed mobility comments: maxA for managment of LE against gravity into bed  Transfers Overall transfer level: Needs assistance Equipment used: None Transfers: Sit to/from [employer] Sit to Stand: Mod assist   Squat pivot transfers: Max assist     General transfer comment: modA for powerup in sit>stand and then became too fearful to transfer from recliner to bed, pt required increased time for powerup due to apprehension of onset of pain, pt sat down and given vc for squat pivot transfer from recliner to bed, modA for powerup and maxA for pivot   Ambulation/Gait              General Gait Details: not able   Stairs            Wheelchair Mobility    Modified Rankin (Stroke Patients Only)       Balance Overall balance assessment: Needs assistance Sitting-balance support: No upper extremity supported;Feet supported Sitting balance-Leahy Scale: Fair     Standing balance support: Bilateral upper extremity supported Standing balance-Leahy Scale: Poor Standing balance comment: requires minA for steadying                              Pertinent Vitals/Pain Pain Assessment: 0-10 Pain Score: 9  Pain Location: abdomenal incision site Pain Descriptors / Indicators: Burning;Constant;Throbbing Pain Intervention(s): Limited activity within patient's tolerance;Monitored during session;Repositioned    Home Living Family/patient expects to be discharged to:: Private residence Living Arrangements: Alone Available Help at Discharge: Family;Available 24 hours/day Type of Home: House Home Access: Stairs to enter Entrance Stairs-Rails: Can reach both Entrance Stairs-Number of Steps: 5 Home Layout: One level Home Equipment: Walker - 2 wheels;Bedside commode;Grab bars - toilet;Grab bars - tub/shower      Prior Function Level of Independence: Independent         Comments: used walker for community ambulation, driver, independent in ADLs and IADLs      Hand Dominance   Dominant Hand: Right    Extremity/Trunk Assessment   Upper Extremity Assessment Upper Extremity Assessment: Overall WFL for tasks assessed    Lower Extremity Assessment  Lower Extremity Assessment: Generalized weakness       Communication   Communication: No difficulties  Cognition Arousal/Alertness: Awake/alert Behavior During Therapy: Anxious Overall Cognitive Status: Within Functional Limits for tasks assessed                                 General Comments: anxious for any movement that causes increased pain       General Comments  General comments (skin integrity, edema, etc.): prior to activity BP 150/74, SaO2 on 4L nasal cannula 100%O2, HR 84bpm, max HR with activity 110bpm, after activity BP 153/77, HR 83bpm, SaO2 on 4L 100% O2        Assessment/Plan    PT Assessment Patient needs continued PT services  PT Problem List Decreased activity tolerance;Decreased mobility;Pain;Decreased knowledge of use of DME       PT Treatment Interventions DME instruction;Gait training;Stair training;Functional mobility training;Therapeutic activities;Therapeutic exercise;Balance training;Patient/family education    PT Goals (Current goals can be found in the Care Plan section)  Acute Rehab PT Goals Patient Stated Goal: be in less pain PT Goal Formulation: With patient Time For Goal Achievement: 04/12/17 Potential to Achieve Goals: Fair    Frequency Min 3X/week   Barriers to discharge Decreased caregiver support         AM-PAC PT "6 Clicks" Daily Activity  Outcome Measure Difficulty turning over in bed (including adjusting bedclothes, sheets and blankets)?: Unable Difficulty moving from lying on back to sitting on the side of the bed? : Unable Difficulty sitting down on and standing up from a chair with arms (e.g., wheelchair, bedside commode, etc,.)?: Unable Help needed moving to and from a bed to chair (including a wheelchair)?: A Lot Help needed walking in hospital room?: Total Help needed climbing 3-5 steps with a railing? : Total 6 Click Score: 7    End of Session Equipment Utilized During Treatment: Gait belt;Oxygen Activity Tolerance: Patient limited by pain Patient left: in bed;with call bell/phone within reach;with nursing/sitter in room Nurse Communication: Mobility status PT Visit Diagnosis: Unsteadiness on feet (R26.81);Other abnormalities of gait and mobility (R26.89);Muscle weakness (generalized) (M62.81);Difficulty in walking, not elsewhere classified (R26.2);Pain Pain - part of body:  (abdomen)     Time: 3009-2330 PT Time Calculation (min) (ACUTE ONLY): 50 min   Charges:   PT Evaluation $PT Eval Moderate Complexity: 1 Mod PT Treatments $Therapeutic Activity: 23-37 mins   PT G Codes:        Ein Rijo B. Migdalia Dk PT, DPT Acute Rehabilitation  [phone] Pager [phone]    [place] 03/29/2017, 6:26 PM

## 2017-03-29 NOTE — Progress Notes (Addendum)
FPTS Interim Progress Note  S:Patient reports that he is feeling slightly better, but is still having abdominal discomfort.   O: BP (!) 162/78   Pulse 73   Temp 97.7 F (36.5 C) (Oral)   Resp (!) 26   Ht 5\' 7"  (1.702 m)   Wt 233 lb 4 oz (105.8 kg)   SpO2 96%   BMI 36.53 kg/m   General: NAD, pleasant Card: Irregular rhythm, regular rate, no murmurs  A/P: Appreciate CCM, we will resume care when patient is out of ICU.  Nikitta Sobiech, [name], DO 03/29/2017, 8:04 AM PGY-1, [place] Medicine Service pager [phone]

## 2017-03-29 NOTE — Progress Notes (Signed)
[facility] Progress Note  Subjective: good UOP 1.3 L yest, creat down slightly , high 5's still.  No c/o's.    Vitals:   03/29/17 0800 03/29/17 0848 03/29/17 0900 03/29/17 1000  BP: (!) 159/63  (!) 140/58 (!) 150/68  Pulse: 76  74 72  Resp: 19  20 (!) 25  Temp:  98.4 F (36.9 C)    TempSrc:  Oral    SpO2: 97%  97% 97%  Weight:      Height:        Inpatient medications: . acetaminophen  650 mg Oral Q6H  . atorvastatin  40 mg Oral QHS  . fentaNYL   Intravenous Q4H  . heparin subcutaneous  5,000 Units Subcutaneous Q8H  . insulin aspart  0-9 Units Subcutaneous Q4H  . insulin glargine  10 Units Subcutaneous Daily  . mouth rinse  15 mL Mouth Rinse BID  . pantoprazole (PROTONIX) IV  40 mg Intravenous Q24H  . polyvinyl alcohol  1 drop Both Eyes TID  . sertraline  150 mg Oral QHS  . tamsulosin  0.4 mg Oral QPC supper   . meropenem (MERREM) IV Stopped (03/28/17 1234)  . methocarbamol (ROBAXIN)  IV     diphenhydrAMINE **OR** diphenhydrAMINE, methocarbamol (ROBAXIN)  IV, naloxone **AND** sodium chloride flush, ondansetron (ZOFRAN) IV, prochlorperazine  Exam: Gen awake, up in chair No jvd or bruits Chest clear bilat to bases RRR soft sem , no RG Abd dressing in place, not removed, dec'd BS GU normal male w foley in place MS no joint effusions or deformity Ext some doughy dependent hip edema Neuro is alert, Ox 3 , nf   UA > 100 prot, o/w negative   Date                Creat               eGFR 2010- 2015      1.5- 1.9 2016                1.8-  2.5 2017                1.9- 2.9            23- 09 Feb 2017          2.91        Impression: 1.  Acute on CRF - baseline creat 3.0. Here creat remains low 5's, not uremic, no indication HD, making urine 2.  Ruptured appendix - sp OR 9/16 3.  Ischemic CM/ hx CAD sp mult stents/ EF 35-40% last echo  - see #5 4.  DM on insulin 5.  Volume - stopped IVF's, f/u CXR yest improved, only vasc congestion. Wt's up, +LE  edema.  Will give IV lasix x 1 today 6.  HTN - BP's up, home meds on hold (coreg/ hydral). Will start IV MTP scheduled    Plan - IV lasix x 2, supportive care, IV MTP q 6 hrs until eating   Vinson Moselle MD [facility] pager [phone]   03/29/2017, 10:54 AM    Recent Labs Lab 03/27/17 2120 03/28/17 0413 03/29/17 0239  NA 143 141 143  K 4.2 5.2* 4.9  CL 119* 113* 116*  CO2 15* 18* 16*  GLUCOSE 89 102* 122*  BUN 82* 105* 114*  CREATININE 4.49* 5.43* 5.17*  CALCIUM 6.2* 8.1* 8.5*    Recent Labs Lab 03/26/17 1512 03/27/17 2120 03/29/17 0239  AST 22 59* 57*  ALT 34 33 34  ALKPHOS 114 52 78  BILITOT 0.7 0.7 0.5  PROT 7.2 4.4* 5.5*  ALBUMIN 3.4* 2.0* 2.3*    Recent Labs Lab 03/25/17 1309  03/27/17 2120 03/28/17 0413 03/29/17 0239  WBC 17.3*  < > 14.8* 17.2* 19.5*  NEUTROABS 14.0*  --   --   --   --   HGB 11.5*  < > 8.5* 9.9* 10.3*  HCT 35.7*  < > 26.1* 30.6* 31.6*  MCV 92.7  < > 93.2 93.3 92.1  PLT 301.0  < > 206 234 261  < > = values in this interval not displayed. Iron/TIBC/Ferritin/ %Sat No results found for: IRON, TIBC, FERRITIN, IRONPCTSAT

## 2017-03-29 NOTE — Progress Notes (Signed)
2 Days Post-Op   Subjective/Chief Complaint: Has not passed much gas, RN just changed dressing, working on IS   Objective: Vital signs in last 24 hours: Temp:  [97.7 F (36.5 C)-98.7 F (37.1 C)] 98.4 F (36.9 C) (09/17 0848) Pulse Rate:  [70-90] 73 (09/17 0700) Resp:  [19-36] 26 (09/17 0700) BP: (81-162)/(44-78) 162/78 (09/17 0700) SpO2:  [91 %-98 %] 96 % (09/17 0700) Arterial Line BP: (128-147)/(48-55) 136/55 (09/16 1400) Weight:  [105.8 kg (233 lb 4 oz)] 105.8 kg (233 lb 4 oz) (09/17 0500) Last BM Date: 03/26/17  Intake/Output from previous day: 09/16 0701 - 09/17 0700 In: 1287.1 [I.V.:1187.1; IV Piggyback:100] Out: 1525 [Urine:1525] Intake/Output this shift: No intake/output data recorded.  GI: soft, a few BS, dressing just changed  Lab Results:   Recent Labs  03/28/17 0413 03/29/17 0239  WBC 17.2* 19.5*  HGB 9.9* 10.3*  HCT 30.6* 31.6*  PLT 234 261   BMET  Recent Labs  03/28/17 0413 03/29/17 0239  NA 141 143  K 5.2* 4.9  CL 113* 116*  CO2 18* 16*  GLUCOSE 102* 122*  BUN 105* 114*  CREATININE 5.43* 5.17*  CALCIUM 8.1* 8.5*   PT/INR No results for input(s): LABPROT, INR in the last 72 hours. ABG No results for input(s): PHART, HCO3 in the last 72 hours.  Invalid input(s): PCO2, PO2  Studies/Results: US Renal  Result Date: 03/27/2017 CLINICAL DATA:  83 year old male with acute kidney injury and oliguria. EXAM: RENAL / URINARY TRACT ULTRASOUND COMPLETE COMPARISON:  03/26/2017 CT and 10/17/2014 renal ultrasound FINDINGS: Right Kidney: Length: 11.2 cm. Cortical thinning and increased echogenicity noted with multiple renal cysts. No definite solid mass. No hydronephrosis. Left Kidney: Length: 10.3 cm. Cortical thinning and increased echogenicity noted with multiple renal cysts. No definite solid mass. No hydronephrosis Bladder: Appears normal for degree of bladder distention. IMPRESSION: 1. No evidence of hydronephrosis 2. Bilateral renal cortical  thinning and increased echogenicity compatible with medical renal disease. 3. Bilateral renal cysts. Electronically Signed   By: Margarette Canada M.D.   On: 03/27/2017 14:22   Dg Chest Port 1 View  Result Date: 03/29/2017 CLINICAL DATA:  Appendicitis with peritonitis EXAM: PORTABLE CHEST 1 VIEW COMPARISON:  03/27/2017 FINDINGS: Left AICD remains in place, unchanged. Cardiomegaly with vascular congestion. Low lung volumes with bibasilar atelectasis. Possible slight interstitial edema with likely improvement since prior study. No significant visible layering effusions. IMPRESSION: Improving interstitial prominence, likely improving interstitial edema. Bibasilar atelectasis. Electronically Signed   By: Rolm Baptise M.D.   On: 03/29/2017 07:47   Dg Chest Port 1 View  Result Date: 03/27/2017 CLINICAL DATA:  Acute kidney injury. EXAM: PORTABLE CHEST 1 VIEW COMPARISON:  3/14/ 17 FINDINGS: Left chest wall AICD is noted with leads in the right atrial appendage and right ventricle. Moderate cardiac enlargement. Left pleural effusion and mild diffuse edema identified. IMPRESSION: Cardiac enlargement and mild CHF. Electronically Signed   By: Kerby Moors M.D.   On: 03/27/2017 21:51    Anti-infectives: Anti-infectives    Start     Dose/Rate Route Frequency Ordered Stop   03/27/17 1230  meropenem (MERREM) 1 g in sodium chloride 0.9 % 100 mL IVPB     1 g 200 mL/hr over 30 Minutes Intravenous Every 24 hours 03/27/17 1153     03/26/17 2200  piperacillin-tazobactam (ZOSYN) IVPB 2.25 g  Status:  Discontinued     2.25 g 100 mL/hr over 30 Minutes Intravenous Every 8 hours 03/26/17 1914 03/27/17 1153  03/26/17 1600  piperacillin-tazobactam (ZOSYN) IVPB 3.375 g     3.375 g 100 mL/hr over 30 Minutes Intravenous  Once 03/26/17 1555 03/26/17 1811      Assessment/Plan: S/P ileocecectomy 9/15 Dr. Corrin Parker of clears, await further bowel function ID - Merrem for peritonitis, WBC trending up but no fever.  Follow. Pulm toilet - doing 500cc on IS for me VTE - SQ hep  LOS: 3 days    Trinady Milewski E 03/29/2017

## 2017-03-29 NOTE — Progress Notes (Signed)
Pharmacy Antibiotic Note  Rodney Farley is a 83 y.o. male  With abscess secondary to perforated appendicitis.  Pharmacy has been consulted for meropenem dosing (Day 3 meropenem). -WBC= 19.5, afebrile, SCr= 5.17 and CrCl ~ 10-15, UOP 0.6 -abscess cultures with GNR   Plan: Change Meropenem to 1000 mg every 12hours Follow-up culture data, clinical progress, and renal function.  Height: 5\' 7"  (170.2 cm) Weight: 233 lb 4 oz (105.8 kg) IBW/kg (Calculated) : 66.1  Temp (24hrs), Avg:98.3 F (36.8 C), Min:97.7 F (36.5 C), Max:98.7 F (37.1 C)   Recent Labs Lab 03/26/17 1531 03/27/17 0705 03/27/17 1010 03/27/17 1531 03/27/17 2120 03/28/17 0413 03/29/17 0239  WBC  --  22.5*  --  23.8* 14.8* 17.2* 19.5*  CREATININE  --  5.29*  --  5.75* 4.49* 5.43* 5.17*  LATICACIDVEN 0.92  --  1.7  --   --   --   --     Estimated Creatinine Clearance: 12.6 mL/min (A) (by C-G formula based on SCr of 5.17 mg/dL (H)).    Allergies  Allergen Reactions  . Bee Venom Anaphylaxis  . Lyrica [Pregabalin] Other (See Comments)    hallucinations  . Prednisone Other (See Comments)    hallucinations  . Zocor [Simvastatin] Nausea Only and Other (See Comments)    Headache with brand name only.  Can take the generic.    Antimicrobials this admission: Zosyn 9/14 >>9/15 Meropenem 9/15>>  Microbiology results: Blood cx x2 9/14 >> ngtd 9/15 abscess- GNR  Thank you for allowing pharmacy to be a part of this patient's care.  Hildred Laser, Pharm D 03/29/2017 8:18 AM

## 2017-03-29 NOTE — Telephone Encounter (Signed)
Patient son called and wanted to know if patient home monitor is working. I informed patient son that patient home monitor has not updated since 03-10-2017. Patient verbalized understanding and said as soon as patient is out of hospital he will get monitor working again.

## 2017-03-29 NOTE — Progress Notes (Signed)
..   Name: Rodney Farley MRN: [id number] DOB: 12-20-1931    ADMISSION DATE:  03/26/2017 CONSULTATION DATE:  03/27/17  REFERRING MD : Mingo Amber MD- [facility]. White, MD  CHIEF COMPLAINT:  2 week history of abdominal pain in RLQ   BRIEF PATIENT DESCRIPTION: 83 y.o. male presenting with RLQ abdominal pain found to have ruptured appendix with abscess. S/P ex lap, appendectomy midline fascia closed and skin left open with ABD pads secured by tape.  Briefly required neosynephrine for OR.  Extubated and returned to ICU / off pressors.     SUBJECTIVE:  Pt reports improved pain control.  Nervous about having to get out of bed in anticipation of pain.    VITAL SIGNS: Temp:  [97.7 F (36.5 C)-98.5 F (36.9 C)] 98.5 F (36.9 C) (09/17 1210) Pulse Rate:  [70-90] 72 (09/17 1000) Resp:  [19-36] 25 (09/17 1000) BP: (81-162)/(44-78) 150/68 (09/17 1000) SpO2:  [91 %-98 %] 97 % (09/17 1000) Arterial Line BP: (136-147)/(54-55) 136/55 (09/16 1400) Weight:  [233 lb 4 oz (105.8 kg)] 233 lb 4 oz (105.8 kg) (09/17 0500)  PHYSICAL EXAMINATION: General:  Elderly male in NAD, sitting up in bed HEENT: MM pink/moist, no jvd PSY: calm/appropriate Neuro: AAOx4, speech clear, MAE CV: s1s2 rrr, no m/r/g PULM: even/non-labored, lungs bilaterally clear anterior, diminished lower lateral  GI: midline abd dressing, tender to palpation / movement Extremities: warm/dry, no edema  Skin: no rashes or lesions   Recent Labs Lab 03/27/17 2120 03/28/17 0413 03/29/17 0239  NA 143 141 143  K 4.2 5.2* 4.9  CL 119* 113* 116*  CO2 15* 18* 16*  BUN 82* 105* 114*  CREATININE 4.49* 5.43* 5.17*  GLUCOSE 89 102* 122*    Recent Labs Lab 03/27/17 2120 03/28/17 0413 03/29/17 0239  HGB 8.5* 9.9* 10.3*  HCT 26.1* 30.6* 31.6*  WBC 14.8* 17.2* 19.5*  PLT 206 234 261   US Renal  Result Date: 03/27/2017 CLINICAL DATA:  83 year old male with acute kidney  injury and oliguria. EXAM: RENAL / URINARY TRACT ULTRASOUND COMPLETE COMPARISON:  03/26/2017 CT and 10/17/2014 renal ultrasound FINDINGS: Right Kidney: Length: 11.2 cm. Cortical thinning and increased echogenicity noted with multiple renal cysts. No definite solid mass. No hydronephrosis. Left Kidney: Length: 10.3 cm. Cortical thinning and increased echogenicity noted with multiple renal cysts. No definite solid mass. No hydronephrosis Bladder: Appears normal for degree of bladder distention. IMPRESSION: 1. No evidence of hydronephrosis 2. Bilateral renal cortical thinning and increased echogenicity compatible with medical renal disease. 3. Bilateral renal cysts. Electronically Signed   By: Margarette Canada M.D.   On: 03/27/2017 14:22   Dg Chest Port 1 View  Result Date: 03/29/2017 CLINICAL DATA:  Appendicitis with peritonitis EXAM: PORTABLE CHEST 1 VIEW COMPARISON:  03/27/2017 FINDINGS: Left AICD remains in place, unchanged. Cardiomegaly with vascular congestion. Low lung volumes with bibasilar atelectasis. Possible slight interstitial edema with likely improvement since prior study. No significant visible layering effusions. IMPRESSION: Improving interstitial prominence, likely improving interstitial edema. Bibasilar atelectasis. Electronically Signed   By: Rolm Baptise M.D.   On: 03/29/2017 07:47   Dg Chest Port 1 View  Result Date: 03/27/2017 CLINICAL DATA:  Acute kidney injury. EXAM: PORTABLE CHEST 1 VIEW COMPARISON:  3/14/ 17 FINDINGS: Left chest wall AICD is noted with  leads in the right atrial appendage and right ventricle. Moderate cardiac enlargement. Left pleural effusion and mild diffuse edema identified. IMPRESSION: Cardiac enlargement and mild CHF. Electronically Signed   By: Kerby Moors M.D.   On: 03/27/2017 21:51   OR Findings: Purulent fluid upon peritoneal entry - cultures sent. Fat containing ventral hernia ~4x4cm which was included in midline incision but ultimately closed primarily as  part of the fascial closure. Appendix and cecum completely stuck to retroperitoneum in the right lower quadrant. Lateral wall of cecum and base of appendix appeared diseased and necrotic which necessitated an ileocecectomy. Ascending colon and distal ileum were normal appearing and healthy.  SIGNIFICANT EVENTS  9/14  Admit with abd pain 9/15  s/p ex lap with appendectomy and wash out 9/16  PCA initiated for pain control  9/17  Hemodynamically stable, transferred to med-surg  STUDIES:  CT ABD/Pelvis 9/14 >> 8.3 x 5.6 cm rounded complex abnormality seen adjacent to inferior portion of cecum with mild surrounding inflammation, enlarged and inflamed appendix, significant wall thickening of the terminal ileum, multiple exophytic cysts in both kidneys Renal US 9/14 >> no evidence of hydronephrosis, bilateral renal cysts  CULTURES: BCx2 9/14 >>  ABD Fluid (OR) 9/15 >>   ANTIBIOTICS: Meropenem 9/15 >>   ASSESSMENT / PLAN:  NEURO: A: Post-operative Pain At Risk Delirium - elderly, post op + narcotics P: Continue reduced dose fentanyl PCA Mobilize / PT efforts  Promote sleep / wake cycle  CARDIAC: A: Hypotension - resolved  Hx AICD, ICM, CAD, CHF, HLD P:  Transfer to telemetry  KVO IVF's  Resume lipitor once able to take PO's Lasix per Nephrology, 60 mg IV Q8 x2 doses   PULMONARY: A: At Risk Atelectasis  P:  Pulmonary hygiene- IS, mobilize Follow intermittent CXR  Wean O2 for sats > 92%  ID: A: Perforated Appendix with Peritonitis  P:  Continue abx as above, D3/x Await cultures to mature  Endocrine: A: IDDM  P: SSI  Lantus 10 units QD > monitor for hypoglycemia given NPO status  GI: A: Post-Op Ex-Lap with Appendectomy & Wash Out  P: NPO x ice chips, sips with meds Defer diet to CCS  ABX as above   Heme: A: Anemia  P: Trend CBC SCD's for DVT prophylaxis  Consider initiation of heparin SQ if ok with surgery 9/18 Monitor for bleeding   RENAL A:    Acute on Chronic Kidney Injury - July 2018 Cr 2.76-> 9/13 3.6 peaked at 5.75 Non anion gap metabolic acidosis P: Trend BMP / urinary output Replace electrolytes as indicated Avoid nephrotoxic agents, ensure adequate renal perfusion IVF as above Nephrology following   Global - transfer to medical telemetry and to FPTS as of 9/18.  Discussed with resident.    Noe Gens, NP-C [facility] Pgr: [phone] or if no answer [phone] 03/29/2017, 12:39 PM  Attending Addendum: I personally examined this patient and agree with plan as detailed above. Patient with perforated appendicitis with abscess, s/p ex-lap. Extubated 9/15 to now on 4L O2 today. Pain well controlled on PCA. Still NPO as no BM or gas yet. Consult PT/OT and encourage mobility. Abscess culture growing GNR's; continue Meropenem. Afebrile but has a leukocytosis. Continue to monitor. If worsens may consider empiric antifungal coverage. AKI on CKD (creatinine 5.17 today from 5.43 yesterday, from baseline of 2-2.7) with good UOP. AKI likely due to ATN in setting of shock intraoperatively. Anemia but Hgb is stable. Continue to monitor. Patient stable  for transfer out of ICU today.   Vernie Murders, MD Pulmonary & Critical Care Medicine

## 2017-03-30 ENCOUNTER — Ambulatory Visit: Payer: PPO | Admitting: Internal Medicine

## 2017-03-30 ENCOUNTER — Inpatient Hospital Stay (HOSPITAL_COMMUNITY): Payer: PPO

## 2017-03-30 DIAGNOSIS — R34 Anuria and oliguria: Secondary | ICD-10-CM

## 2017-03-30 DIAGNOSIS — K3533 Acute appendicitis with perforation and localized peritonitis, with abscess: Secondary | ICD-10-CM

## 2017-03-30 LAB — GLUCOSE, CAPILLARY
GLUCOSE-CAPILLARY: 108 mg/dL — AB (ref 65–99)
GLUCOSE-CAPILLARY: 131 mg/dL — AB (ref 65–99)
Glucose-Capillary: 108 mg/dL — ABNORMAL HIGH (ref 65–99)
Glucose-Capillary: 104 mg/dL — ABNORMAL HIGH (ref 65–99)
Glucose-Capillary: 120 mg/dL — ABNORMAL HIGH (ref 65–99)
Glucose-Capillary: 141 mg/dL — ABNORMAL HIGH (ref 65–99)
Glucose-Capillary: 149 mg/dL — ABNORMAL HIGH (ref 65–99)

## 2017-03-30 LAB — CBC
HCT: 32.1 % — ABNORMAL LOW (ref 39.0–52.0)
Hemoglobin: 10.7 g/dL — ABNORMAL LOW (ref 13.0–17.0)
MCH: 30.3 pg (ref 26.0–34.0)
MCHC: 33.3 g/dL (ref 30.0–36.0)
MCV: 90.9 fL (ref 78.0–100.0)
PLATELETS: 282 10*3/uL (ref 150–400)
RBC: 3.53 MIL/uL — ABNORMAL LOW (ref 4.22–5.81)
RDW: 15.2 % (ref 11.5–15.5)
WBC: 18.3 10*3/uL — ABNORMAL HIGH (ref 4.0–10.5)

## 2017-03-30 LAB — BASIC METABOLIC PANEL
Anion gap: 9 (ref 5–15)
BUN: 117 mg/dL — AB (ref 6–20)
CO2: 21 mmol/L — ABNORMAL LOW (ref 22–32)
CREATININE: 4.09 mg/dL — AB (ref 0.61–1.24)
Calcium: 8.8 mg/dL — ABNORMAL LOW (ref 8.9–10.3)
Chloride: 116 mmol/L — ABNORMAL HIGH (ref 101–111)
GFR calc Af Amer: 14 mL/min — ABNORMAL LOW (ref >60)
GFR, EST NON AFRICAN AMERICAN: 12 mL/min — AB (ref >60)
Glucose, Bld: 114 mg/dL — ABNORMAL HIGH (ref 65–99)
POTASSIUM: 4.4 mmol/L (ref 3.5–5.1)
SODIUM: 146 mmol/L — AB (ref 135–145)

## 2017-03-30 LAB — MAGNESIUM: Magnesium: 2.3 mg/dL (ref 1.7–2.4)

## 2017-03-30 LAB — PHOSPHORUS: PHOSPHORUS: 4.8 mg/dL — AB (ref 2.5–4.6)

## 2017-03-30 MED ORDER — DEXTROSE 5 % IV SOLN
INTRAVENOUS | Status: DC
  Administered 2017-03-30 – 2017-03-31 (×2): via INTRAVENOUS

## 2017-03-30 MED ORDER — DEXTROSE-NACL 5-0.45 % IV SOLN
INTRAVENOUS | Status: DC
  Filled 2017-03-30: qty 1000

## 2017-03-30 MED ORDER — SODIUM CHLORIDE 0.9 % IV SOLN
1.0000 g | Freq: Two times a day (BID) | INTRAVENOUS | Status: DC
  Administered 2017-03-30 – 2017-03-31 (×3): 1 g via INTRAVENOUS
  Filled 2017-03-30 (×4): qty 1

## 2017-03-30 NOTE — Discharge Summary (Signed)
[facility] Discharge Summary  Patient name: Rodney Farley Medical record number: [id number] Date of birth: [birth year] Age: 83 y.o. Gender: male Date of Admission: 03/26/2017  Date of Discharge: 04/05/17 Admitting Physician: Alveda Reasons, MD  Primary Care Provider: [name], MD Consultants: General Surgery, GI, Nephrology  Indication for Hospitalization: RLQ abdominal pain found to have ruptured appendicitis with abscess  Discharge Diagnoses/Problem List:  Ruptured appendicitis s/p ileocecectomy AKI with CKD stage IV HFpEF HLD HTN T2DM MDD BPH  Disposition: SNF  Discharge Condition: stable  Discharge Exam:  General: NAD, pleasant Cardiovascular: RRR, normal S1, S2, no murmurs Respiratory: CTAB, no wheezes in frontal lung fields Abdomen: soft, moderate distention, mild tenderness to palpation in LLQ, midline surgical wound with wound vac in place Extremities: trace lower extremity edema Neuro: A&O, appropriate affect  Brief Hospital Course:  Rodney Farley is a 83 y.o. male who presented with RLQ abdominal pain found to have ruptured appendicitis with abscess. PMH is significant for COPD, CKD4, CAD with pacemaker and defibrillator, DM Type II, HTN, HFpEF, MDD and BPH. Surgery consulted and performed exploratory laparotomy with ileocecectomy on 03/27/2017. Patient extubated after surgery and returned to ICU. On 9/16PCA initiated for pain control. On 9/17 He was found to be hemodynamically stable, and was transferred to med-surg out of ICU. Patient received zosyn on admission and for 2 days (9/14-9/15). He was then narrowed to Meropenem (9/15-9/19). After culture from surgery returned, patient was narrowed to Unasyn and completed 10 day abx course with Unasyn (9/19-9/23). WBC was stable at 17.3 on day of discharge. His diet was slowly increased to soft diet and his pain medications weaned. Discharged on soft diet.   Issues for  Follow Up:  1. Patient has been on clopidogrel for over two years. He should not be restarted on clopidogrel.  2. Ruptured appendicitis s/p ileocecectomy: Follow up with wound care and general surgery. WBC stable at 17, and patient afebrile. Continue scheduled Tylenol and Oxy IR 5 mg q8 prn, patient not requiring much Oxy IR, wean off as able. Encourage ambulation. 3. HTN: Patient started on Lisinopril 2.5 mg for HTN and protective kidney properties. Continue to monitor kidney function with his history of CKD. Will continue Coreg 6.25 mg BID, home dosage is 12.65m BID, will need close follow up and can increase as tolerated. Will discontinue home hydralazine and imdur.  4. T2DM: On home Lantus of 60 U, only receiving SSI while admitted. Will need to follow up for management of diabetes. Discharged with sensitive sliding scale. 5. MDD: Per family, patient to be changed from Zoloft to Wellbutrin, will need follow up with primary care to do this.  6. HFpEF: Weight 214lb on day of discharge. Holding Lasix while admitted. Monitor fluid status and resume home dose as appropriate.  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 Continue with liquifilm tears TID. Patient will need follow up with ophthalmologist as outpatient 8. OSA: Patient reports he does not use his CPAP at home. May need outpatient follow up and new sleep study.   Significant Procedures:  Exploratory Laparotomy: Ileocecectomy with ileocolic anastomosis  Significant Labs and Imaging:   Recent Labs Lab 04/03/17 0742 04/04/17 0458 04/05/17 0226  WBC 17.0* 17.4* 17.3*  HGB 10.4* 9.7* 9.2*  HCT 32.0* 30.0* 29.1*  PLT 320 331 346    Recent Labs Lab 03/30/17 0508  04/01/17 0425 04/02/17 [id number] 0742 04/04/17 0458 04/05/17 0226  NA 146*  < >  146* 142 140 141 141  K 4.4  < > 3.9 3.6 3.5 3.4* 3.9  CL 116*  < > 115* 113* 112* 113* 114*  CO2 21*  < > 22 21* 22 19* 20*  GLUCOSE 114*  < > 195* 186* 143* 184* 144*  BUN  117*  < > 91* 79* 79* 76* 69*  CREATININE 4.09*  < > 2.83* 2.74* 2.82* 2.90* 2.77*  CALCIUM 8.8*  < > 8.4* 8.4* 8.3* 8.1* 8.3*  MG 2.3  --   --   --   --   --   --   PHOS 4.8*  --   --   --   --   --   --   < > = values in this interval not displayed. Ct Abdomen Pelvis Wo Contrast  Result Date: 03/26/2017 CLINICAL DATA:  Acute right-sided abdominal pain. EXAM: CT ABDOMEN AND PELVIS WITHOUT CONTRAST TECHNIQUE: Multidetector CT imaging of the abdomen and pelvis was performed following the standard protocol without IV contrast. COMPARISON:  None. FINDINGS: Lower chest: No acute abnormality. Hepatobiliary: No focal liver abnormality is seen. Status post cholecystectomy. No biliary dilatation. Pancreas: Unremarkable. No pancreatic ductal dilatation or surrounding inflammatory changes. Spleen: Normal in size without focal abnormality. Adrenals/Urinary Tract: Adrenal glands appear normal. Bilateral renal cysts are noted, some of which are hyperdense in appearance. No hydronephrosis or renal obstruction is noted. No renal or ureteral calculi are noted. Urinary bladder is unremarkable. Stomach/Bowel: The stomach appears normal. There is no evidence of bowel obstruction. There is a large rounded abnormality seen adjacent to the inferior portion of the cecum that measures 8.3 x 5.6 cm. Mild surrounding inflammation is noted. This is concerning for possible abscess or phlegmon status post ruptured appendicitis. There appears to be an enlarged and inflamed appendix in this area, although it is difficult to be certain given the degree of inflammation in the area. Significant wall thickening of the terminal ileum is noted which may be secondary to the inflammation. Vascular/Lymphatic: Aortic atherosclerosis. No enlarged abdominal or pelvic lymph nodes. Reproductive: Mild prostatic enlargement is noted. Other: No abdominal wall hernia or abnormality. No abdominopelvic ascites. Musculoskeletal: No acute or significant  osseous findings. IMPRESSION: 8.3 x 5.6 cm rounded complex abnormality seen adjacent to inferior portion of this cecum with mild surrounding inflammation. This is concerning for possible abscess or phlegmon related to ruptured appendicitis. Possible enlarged and inflamed appendix may be seen in this area, although it is difficult to be certain given the degree of inflammation present. Significant wall thickening of the terminal ileum is noted most likely secondary to adjacent inflammation. Critical Value/emergent results were called by telephone at the time of interpretation on 03/26/2017 at 12:40 pm to Dr. Garret Reddish , who verbally acknowledged these results. Aortic atherosclerosis. Mild prostatic enlargement. Multiple exophytic cysts are seen involving both kidneys, several which appear to be hyperdense and therefore most likely complex cysts. Ultrasound is recommended to rule out potential masses. Electronically Signed   By: Marijo Conception, M.D.   On: 03/26/2017 12:41   US Renal  Result Date: 03/27/2017 CLINICAL DATA:  83 year old male with acute kidney injury and oliguria. EXAM: RENAL / URINARY TRACT ULTRASOUND COMPLETE COMPARISON:  03/26/2017 CT and 10/17/2014 renal ultrasound FINDINGS: Right Kidney: Length: 11.2 cm. Cortical thinning and increased echogenicity noted with multiple renal cysts. No definite solid mass. No hydronephrosis. Left Kidney: Length: 10.3 cm. Cortical thinning and increased echogenicity noted with multiple renal cysts. No definite solid mass. No  hydronephrosis Bladder: Appears normal for degree of bladder distention. IMPRESSION: 1. No evidence of hydronephrosis 2. Bilateral renal cortical thinning and increased echogenicity compatible with medical renal disease. 3. Bilateral renal cysts. Electronically Signed   By: Margarette Canada M.D.   On: 03/27/2017 14:22   Dg Chest Port 1 View  Result Date: 03/27/2017 CLINICAL DATA:  Acute kidney injury. EXAM: PORTABLE CHEST 1 VIEW COMPARISON:   3/14/ 17 FINDINGS: Left chest wall AICD is noted with leads in the right atrial appendage and right ventricle. Moderate cardiac enlargement. Left pleural effusion and mild diffuse edema identified. IMPRESSION: Cardiac enlargement and mild CHF. Electronically Signed   By: Kerby Moors M.D.   On: 03/27/2017 21:51   Results/Tests Pending at Time of Discharge:   Discharge Medications:  Allergies as of 04/05/2017      Reactions   Bee Venom Anaphylaxis   Lyrica [pregabalin] Other (See Comments)   hallucinations   Prednisone Other (See Comments)   hallucinations   Zocor [simvastatin] Nausea Only, Other (See Comments)   Headache with brand name only.  Can take the generic.      Medication List    STOP taking these medications   buPROPion 150 MG 24 hr tablet Commonly known as:  WELLBUTRIN XL   clopidogrel 75 MG tablet Commonly known as:  PLAVIX   furosemide 40 MG tablet Commonly known as:  LASIX   hydrALAZINE 25 MG tablet Commonly known as:  APRESOLINE   Insulin Glargine 100 UNIT/ML Solostar Pen Commonly known as:  LANTUS SOLOSTAR   isosorbide mononitrate 30 MG 24 hr tablet Commonly known as:  IMDUR     TAKE these medications   acetaminophen 325 MG tablet Commonly known as:  TYLENOL Take 2 tablets (650 mg total) by mouth every 6 (six) hours.   aspirin EC 81 MG tablet Take 81 mg by mouth daily.   atorvastatin 40 MG tablet Commonly known as:  LIPITOR Take 1 tablet (40 mg total) by mouth daily. What changed:  when to take this   BLINK TEARS 0.25 % Soln Generic drug:  Polyethylene Glycol 400 Place 1 drop into both eyes 3 (three) times daily.   BLINK TEARS 0.25 % Gel Generic drug:  Polyethylene Glycol 400 Place 1 drop into both eyes at bedtime.   carvedilol 6.25 MG tablet Commonly known as:  COREG Take 1 tablet (6.25 mg total) by mouth 2 (two) times daily with a meal. What changed:  medication strength  how much to take   glucose blood test strip Commonly  known as:  FREESTYLE TEST STRIPS Use as instructed   glucose monitoring kit monitoring kit USE TO MONITOR BLOOD GLUCOSE AS DIRECTED   FREESTYLE LITE Devi   insulin aspart 100 UNIT/ML injection Commonly known as:  novoLOG Inject 0-5 Units into the skin at bedtime.   insulin aspart 100 UNIT/ML injection Commonly known as:  novoLOG Inject 0-9 Units into the skin 3 (three) times daily with meals.   Insulin Pen Needle 31G X 5 MM Misc Commonly known as:  UNIFINE PENTIPS USE AS DIRECTED   lisinopril 2.5 MG tablet Commonly known as:  PRINIVIL,ZESTRIL Take 1 tablet (2.5 mg total) by mouth daily.   nitroGLYCERIN 0.4 MG SL tablet Commonly known as:  NITROSTAT Place 1 tablet (0.4 mg total) under the tongue every 5 (five) minutes as needed for chest pain. 2 doses before calling for emergent help   oxyCODONE 5 MG immediate release tablet Commonly known as:  Oxy IR/ROXICODONE Take 1  tablet (5 mg total) by mouth every 6 (six) hours as needed for severe pain.   pantoprazole 40 MG tablet Commonly known as:  PROTONIX TAKE 1 TABLET BY MOUTH ONCE DAILY What changed:  See the new instructions.   polyethylene glycol packet Commonly known as:  MIRALAX / GLYCOLAX Take 17 g by mouth daily.   sertraline 100 MG tablet Commonly known as:  ZOLOFT Take 1.5 tablets (150 mg total) by mouth daily.   tamsulosin 0.4 MG Caps capsule Commonly known as:  FLOMAX TAKE 1 CAPSULE BY MOUTH DAILY AFTER SUPPER What changed:  how much to take  how to take this  when to take this  additional instructions   vitamin B-12 1000 MCG tablet Commonly known as:  CYANOCOBALAMIN Take 1,000 mcg by mouth daily.            Discharge Care Instructions        Start     Ordered   04/06/17 0000  lisinopril (PRINIVIL,ZESTRIL) 2.5 MG tablet  Daily     04/05/17 1237   04/06/17 0000  polyethylene glycol (MIRALAX / GLYCOLAX) packet  Daily     04/05/17 1237   04/05/17 0000  acetaminophen (TYLENOL) 325 MG tablet   Every 6 hours     04/05/17 1237   04/05/17 0000  carvedilol (COREG) 6.25 MG tablet  2 times daily with meals     04/05/17 1237   04/05/17 0000  insulin aspart (NOVOLOG) 100 UNIT/ML injection  Daily at bedtime     04/05/17 1237   04/05/17 0000  insulin aspart (NOVOLOG) 100 UNIT/ML injection  3 times daily with meals     04/05/17 1237   04/05/17 0000  oxyCODONE (OXY IR/ROXICODONE) 5 MG immediate release tablet  Every 6 hours PRN     04/05/17 1237      Discharge Instructions: Please refer to Patient Instructions section of EMR for full details.  Patient was counseled important signs and symptoms that should prompt return to medical care, changes in medications, dietary instructions, activity restrictions, and follow up appointments.   Follow-Up Appointments: Contact information for after-discharge care    Destination    [facility] .   Specialty:  [place] information: [place] [phone]              Hansini Clodfelter, [name], [place] 04/05/2017, 12:39 PM PGY-1, [place]

## 2017-03-30 NOTE — Progress Notes (Signed)
Physical Therapy Treatment Patient Details Name: Rodney Farley MRN: [id number] DOB: 1932/06/22 Today's Date: 03/30/2017    History of Present Illness 83 y.o. male presenting with RLQ abdominal pain found to have ruptured appendix with abscess. S/P ex lap, appendectomy midline fascia closed and skin left open with ABD pads secured by tape.  Briefly required neosynephrine for OR.  Extubated and returned to ICU / off pressors.     PT Comments    Practiced sit to stand and SPT with stedy for helping pt increase confidence and decreased anxiety with being up. It went very well and we discussed progressing to use of RW and ambulation. VSS on 4L O2 PT will continue to follow.    Follow Up Recommendations  SNF     Equipment Recommendations  Other (comment) (TBD)    Recommendations for Other Services OT consult     Precautions / Restrictions Precautions Precautions: Fall Restrictions Weight Bearing Restrictions: No    Mobility  Bed Mobility Overal bed mobility: Needs Assistance Bed Mobility: Supine to Sit     Supine to sit: Mod assist;+2 for physical assistance     General bed mobility comments: mod A for LE's off bed and elevation of trunk from behind by +2  Transfers Overall transfer level: Needs assistance Equipment used: Ambulation equipment used Transfers: Sit to/from Stand Sit to Stand: Min assist;+2 safety/equipment         General transfer comment: used stedy today because ot had been so anxious with transfer yesterday. With stable grab bar, pt was able to stand with min A, performed 3x.   Ambulation/Gait                 Stairs            Wheelchair Mobility    Modified Rankin (Stroke Patients Only)       Balance Overall balance assessment: Needs assistance Sitting-balance support: No upper extremity supported;Feet supported Sitting balance-Leahy Scale: Fair     Standing balance support: Bilateral upper extremity  supported Standing balance-Leahy Scale: Poor Standing balance comment: able to maintain standing in stedy x5 mins to work on posture as well as trying to get phlegm out of chest.                             Cognition Arousal/Alertness: Awake/alert Behavior During Therapy: Anxious Overall Cognitive Status: Within Functional Limits for tasks assessed                                        Exercises General Exercises - Lower Extremity Ankle Circles/Pumps: AROM;Both;10 reps;Seated Long Arc Quad: AROM;Both;10 reps;Seated Hip Flexion/Marching: AROM;Both;10 reps;Seated    General Comments General comments (skin integrity, edema, etc.): VSS on 4L O2      Pertinent Vitals/Pain Pain Assessment: Faces Faces Pain Scale: Hurts even more Pain Location: abdominal incision site Pain Descriptors / Indicators: Burning;Restless;Stabbing Pain Intervention(s): Limited activity within patient's tolerance;Monitored during session;PCA encouraged    Home Living                      Prior Function            PT Goals (current goals can now be found in the care plan section) Acute Rehab PT Goals Patient Stated Goal: be in less pain PT Goal Formulation: With patient Time  For Goal Achievement: 04/12/17 Potential to Achieve Goals: Fair Progress towards PT goals: Progressing toward goals    Frequency    Min 3X/week      PT Plan Current plan remains appropriate    Co-evaluation              AM-PAC PT "6 Clicks" Daily Activity  Outcome Measure  Difficulty turning over in bed (including adjusting bedclothes, sheets and blankets)?: Unable Difficulty moving from lying on back to sitting on the side of the bed? : Unable Difficulty sitting down on and standing up from a chair with arms (e.g., wheelchair, bedside commode, etc,.)?: Unable Help needed moving to and from a bed to chair (including a wheelchair)?: A Lot Help needed walking in hospital  room?: Total Help needed climbing 3-5 steps with a railing? : Total 6 Click Score: 7    End of Session Equipment Utilized During Treatment: Gait belt;Oxygen Activity Tolerance: Patient tolerated treatment well Patient left: in chair;with call bell/phone within reach;with family/visitor present Nurse Communication: Mobility status PT Visit Diagnosis: Unsteadiness on feet (R26.81);Other abnormalities of gait and mobility (R26.89);Muscle weakness (generalized) (M62.81);Difficulty in walking, not elsewhere classified (R26.2);Pain Pain - part of body:  (abdomen)     Time: 7124-5809 PT Time Calculation (min) (ACUTE ONLY): 26 min  Charges:  $Therapeutic Activity: 23-37 mins                    G Codes:       [place]  [place] 03/30/2017, 1:25 PM

## 2017-03-30 NOTE — Progress Notes (Signed)
Family Medicine Teaching Service Daily Progress Note Intern Pager: [phone]  Patient name: Rodney Farley Medical record number: [id number] Date of birth: 1932-04-11 Age: 83 y.o. Gender: male  Primary Care Provider: [name], MD Consultants: Nephrology Code Status: FULL  Pt Overview and Major Events to Date:  9/15  s/p ex lap with appendectomy and wash out 9/16  PCA initiated for pain control  9/17  Hemodynamically stable, transferred to med-surg  Assessment and Plan:  Ruptured appendicitis post ileocecectomy: stable. -Surgery has been consulted.  -Follow their recs for wound care and advancing diet. -Continue Meropenem day .  -Remains nothing by mouth for now. - Zofran PRN - Protonix 40mg  IV  - Continue Tylenol and Fentanyl for pain control  AK I on CAD stage IV: Improving, still above baseline of 2.74 -Creatinine this am was 4.09 -Nephrology has been consulted for further input.  HFpEF: - Continuing to hold Lasix for now.  - Start maintenance fluids for him 1/2 NS @100mL /hr - Restarted Metoprolol IV  HLD: - Restarted Atorvastatin 40mg   HTN: Blood pressure had remained elevated for past 24-48 hours - Consider adding in BP med for better control.  T2DM: Blood glucose has been well controlled without Lantus. - SSI - Continue to hold Lantus  FEN/GI: NPO PPx: Heparin  Disposition: watcher, continue to watch inpatient  Subjective:  Rodney Farley is an 83y/o male with PMH of . Today he is not oriented to time or place but can answer questions appropriately. He states he is having trouble speaking and states he is having continual abdominal pain. He reports no other complaints and is pleasant.   He denies cough, chest pain, syncope, near syncope, or dizziness.  Objective: Temp:  [97.8 F (36.6 C)-98.4 F (36.9 C)] 98.4 F (36.9 C) (09/18 1335) Pulse Rate:  [68-75] 70 (09/18 1335) Resp:  [14-28] 28 (09/18 1335) BP: (88-183)/(35-85) 141/62  (09/18 1335) SpO2:  [92 %-100 %] 97 % (09/18 1335) Weight:  [232 lb (105.2 kg)] 232 lb (105.2 kg) (09/18 0140) Physical Exam: General: Alert, not oriented to place or time Cardiovascular: RRR, normal S1, S2, no murmurs Respiratory: CTAB, no wheezes in frontal lung fields Abdomen: soft, mild distention, RUQ and RLQ extremely TTP, midline abdominal surgical wound is bandaged Extremities: no cyanosis, edema  Laboratory:  Recent Labs Lab 03/28/17 0413 03/29/17 0239 03/30/17 0508  WBC 17.2* 19.5* 18.3*  HGB 9.9* 10.3* 10.7*  HCT 30.6* 31.6* 32.1*  PLT 234 261 282    Recent Labs Lab 03/26/17 1512  03/27/17 2120 03/28/17 0413 03/29/17 0239 03/30/17 0508  NA 135  < > 143 141 143 146*  K 4.8  < > 4.2 5.2* 4.9 4.4  CL 100*  < > 119* 113* 116* 116*  CO2 22  < > 15* 18* 16* 21*  BUN 79*  < > 82* 105* 114* 117*  CREATININE 3.77*  < > 4.49* 5.43* 5.17* 4.09*  CALCIUM 9.2  < > 6.2* 8.1* 8.5* 8.8*  PROT 7.2  --  4.4*  --  5.5*  --   BILITOT 0.7  --  0.7  --  0.5  --   ALKPHOS 114  --  52  --  78  --   ALT 34  --  33  --  34  --   AST 22  --  59*  --  57*  --   GLUCOSE 143*  < > 89 102* 122* 114*  < > = values in this  interval not displayed.  9/14 - BCx neg x 2 9/15 - Abd fluid -   Imaging/Diagnostic Tests: CT ABD/Pelvis 9/14 >> 8.3 x 5.6 cm rounded complex abnormality seen adjacent to inferior portion of cecum with mild surrounding inflammation, enlarged and inflamed appendix, significant wall thickening of the terminal ileum, multiple exophytic cysts in both kidneys Renal US 9/14 >> no evidence of hydronephrosis, bilateral renal cysts  9/15 - OR Findings: Purulent fluid upon peritoneal entry - cultures sent. Fat containing ventral hernia ~4x4cm which was included in midline incision but ultimately closed primarily as part of the fascial closure. Appendix and cecum completely stuck to retroperitoneum in the right lower quadrant. Lateral wall of cecum and base of appendix appeared  diseased and necrotic which necessitated an ileocecectomy. Ascending colon and distal ileum were normal appearing and healthy.  Nuala Alpha, DO 03/30/2017, 3:14 PM PGY-1, [place] Intern pager: [phone], text pages welcome

## 2017-03-30 NOTE — Progress Notes (Signed)
[facility] Progress Note  Subjective: creat down to 4.0, good UOP 3.6 later w/ IV lasix x 2   Vitals:   03/30/17 0458 03/30/17 0753 03/30/17 1044 03/30/17 1209  BP: (!) 157/80  (!) 162/66   Pulse: 75  69   Resp: _0 Temp: 98.2 F (36.8 C)     TempSrc: Oral  Oral   SpO2: 93% 93% 96% 93%  Weight:      Height:        Inpatient medications: . acetaminophen  650 mg Oral Q6H  . atorvastatin  40 mg Oral QHS  . fentaNYL   Intravenous Q4H  . heparin subcutaneous  5,000 Units Subcutaneous Q8H  . insulin aspart  0-9 Units Subcutaneous Q4H  . mouth rinse  15 mL Mouth Rinse BID  . metoprolol tartrate  5 mg Intravenous Q6H  . pantoprazole (PROTONIX) IV  40 mg Intravenous Q24H  . polyvinyl alcohol  1 drop Both Eyes TID  . sertraline  150 mg Oral QHS  . tamsulosin  0.4 mg Oral QPC supper   . dextrose 60 mL/hr at 03/30/17 1209  . meropenem (MERREM) IV 1 g (03/30/17 1206)  . methocarbamol (ROBAXIN)  IV     diphenhydrAMINE **OR** diphenhydrAMINE, methocarbamol (ROBAXIN)  IV, naloxone **AND** sodium chloride flush, ondansetron (ZOFRAN) IV, prochlorperazine  Exam: Gen awake, up in chair No jvd or bruits Chest clear bilat to bases RRR soft sem , no RG Abd dressing in place, not removed, dec'd BS GU normal male w foley in place MS no joint effusions or deformity Ext some doughy dependent hip edema Neuro is alert, Ox 3 , nf   UA > 100 prot, o/w negative   Date                Creat               eGFR 2010- 2015      1.5- 1.9 2016                1.8-  2.5 2017                1.9- 2.9            23- 09 Feb 2017          2.91        Impression: 1.  Acute on CRF - baseline creat 3.0. Creat down today to 4, improving. 2.  Ruptured appendix - sp OR 9/16 3.  Ischemic CM/ hx CAD sp mult stents/ EF 35-40% last echo  - slight vol excess, better after IV lasix yest, will hold lasix today, cont to monitor 4.  DM on insulin 5.  HTN - home meds on hold , on IV  MTP scheduled for now    Plan - check creat am  Kelly Splinter MD [place] pager [phone]   03/30/2017, 12:44 PM    Recent Labs Lab 03/28/17 0413 03/29/17 0239 03/30/17 0508  NA 141 143 146*  K 5.2* 4.9 4.4  CL 113* 116* 116*  CO2 18* 16* 21*  GLUCOSE 102* 122* 114*  BUN 105* 114* 117*  CREATININE 5.43* 5.17* 4.09*  CALCIUM 8.1* 8.5* 8.8*  PHOS  --   --  4.8*    Recent Labs Lab 03/26/17 1512 03/27/17 2120 03/29/17 0239  AST 22 59* 57*  ALT 34 33 34  ALKPHOS 114 52 78  BILITOT 0.7 0.7 0.5  PROT 7.2  4.4* 5.5*  ALBUMIN 3.4* 2.0* 2.3*    Recent Labs Lab 03/25/17 1309  03/28/17 0413 03/29/17 0239 03/30/17 0508  WBC 17.3*  < > 17.2* 19.5* 18.3*  NEUTROABS 14.0*  --   --   --   --   HGB 11.5*  < > 9.9* 10.3* 10.7*  HCT 35.7*  < > 30.6* 31.6* 32.1*  MCV 92.7  < > 93.3 92.1 90.9  PLT 301.0  < > 234 261 282  < > = values in this interval not displayed. Iron/TIBC/Ferritin/ %Sat No results found for: IRON, TIBC, FERRITIN, IRONPCTSAT

## 2017-03-30 NOTE — Care Management Important Message (Signed)
Important Message  Patient Details  Name: Khalee Mazo MRN: [id number] Date of Birth: 03-31-1932   Medicare Important Message Given:  Yes    Crewe Heathman 03/30/2017, 11:21 AM

## 2017-03-30 NOTE — Consult Note (Addendum)
[place] Nurse wound consult note Reason for Consult:Surgical team following for assessment and plan of care; consult requested for application of Vac to abd. Wound type: Full thickness post-op wound to midline abd Measurement: 22X5X5cm Wound bed: moist red inner wound with yellow adipose tissue interspersed throughout Drainage (amount, consistency, odor) small amt yellow drainage, no odor Periwound: intact skin surrounding Dressing procedure/placement/frequency: Applied one piece black sponge to 133mm cont suction.  Pt tolerated with minimal amt discomfort after PCA used for pain.  Plan for bedsdie nurse to change on FRI, then begin q M/W/F next week. Please re-consult if further assistance is needed.  Thank-you,  Julien Girt MSN, [place], [place], [place], [place]

## 2017-03-30 NOTE — Clinical Social Work Note (Signed)
Clinical Social Work Assessment  Patient Details  Name: Rodney Farley MRN: [id number] Date of Birth: [birth year]  Date of referral:  03/30/17               Reason for consult:  Facility Placement                Permission sought to share information with:    Permission granted to share information::     Name::        Agency::     Relationship::     Contact Information:     Housing/Transportation Living arrangements for the past 2 months:  Single Family Home Source of Information:  Patient Patient Interpreter Needed:  None Criminal Activity/Legal Involvement Pertinent to Current Situation/Hospitalization:  No - Comment as needed Significant Relationships:  Adult Children Lives with:  Self Do you feel safe going back to the place where you live?    Need for family participation in patient care:     Care giving concerns:  NO family or friends present at bedside.   Social Worker assessment / plan:  CSW spoke with pt at bedside to complete initial assessment. Pt lives home alone. Pt is unfamiliar with SNF in [place]. Pt is from [place] and would like CSW to send referral to [place] facilities. CSW will provide b/o once available.  Employment status:  Retired Nurse, adult PT Recommendations:  [place] / Referral to community resources:  [place]  Patient/Family's Response to care:  Pt verbalized understanding of CSW role and expressed appreciation for support. Pt denies any concern regarding pt care at this time.   Patient/Family's Understanding of and Emotional Response to Diagnosis, Current Treatment, and Prognosis:  Pt understanding and realistic regarding physical limitations. Pt understands the need for SNF placement at d/c. Pt agreeable to SNF placement at d/c, at this time. Pt's responses emotionally appropriate during conversation with CSW. Pt denies any concern regarding treatment plan at  this time. CSW will continue to provide support and facilitate d/c needs.   Emotional Assessment Appearance:  Appears stated age Attitude/Demeanor/Rapport:   (Patient was appropriate.) Affect (typically observed):  Guarded, Blunt, Flat Orientation:  Oriented to Self, Oriented to Place, Oriented to  Time, Oriented to Situation Alcohol / Substance use:  Not Applicable Psych involvement (Current and /or in the community):  No (Comment)  Discharge Needs  Concerns to be addressed:  No discharge needs identified Readmission within the last 30 days:  No Current discharge risk:  Dependent with Mobility Barriers to Discharge:  Continued Medical Work up   [employer], LCSW 03/30/2017, 3:44 PM

## 2017-03-30 NOTE — Progress Notes (Signed)
Patient ID: Rodney Farley, male   DOB: 1931-09-29, 83 y.o.   MRN: [id number]  [facility] Surgery Progress Note  3 Days Post-Op  Subjective: CC- pain Patient resting in bed. Per RN he is refusing to move or get OOB. He currently denies abdominal pain, nausea, or vomiting. Tolerating sips/chips. He has not passed any flatus or had a BM yet. Worked with PT yesterday, recommending SNF.  Objective: Vital signs in last 24 hours: Temp:  [97.8 F (36.6 C)-98.5 F (36.9 C)] 98.2 F (36.8 C) (09/18 0458) Pulse Rate:  [68-75] 75 (09/18 0458) Resp:  [14-30] 18 (09/18 0753) BP: (88-183)/(35-159) 157/80 (09/18 0458) SpO2:  [92 %-100 %] 93 % (09/18 0753) Weight:  [232 lb (105.2 kg)] 232 lb (105.2 kg) (09/18 0140) Last BM Date: 03/26/17  Intake/Output from previous day: 09/17 0701 - 09/18 0700 In: 600 [P.O.:500; IV Piggyback:100] Out: 3665 [Urine:3665] Intake/Output this shift: No intake/output data recorded.  PE: Gen:  Alert, NAD HEENT: EOM's intact, pupils equal and round Card:  RRR, no M/G/R heard Pulm:  CTAB, no W/R/R, effort normal. Pulling 500 on IS Abd: Soft, NT, mild distension, +BS, midline incision with healthy pink tissue/no surrounding cellulitis or drainage Psych: A&Ox3  Skin: no rashes noted, warm and dry  Lab Results:   Recent Labs  03/29/17 0239 03/30/17 0508  WBC 19.5* 18.3*  HGB 10.3* 10.7*  HCT 31.6* 32.1*  PLT 261 282   BMET  Recent Labs  03/29/17 0239 03/30/17 0508  NA 143 146*  K 4.9 4.4  CL 116* 116*  CO2 16* 21*  GLUCOSE 122* 114*  BUN 114* 117*  CREATININE 5.17* 4.09*  CALCIUM 8.5* 8.8*   PT/INR No results for input(s): LABPROT, INR in the last 72 hours. CMP     Component Value Date/Time   NA 146 (H) 03/30/2017 0508   NA 139 09/18/2015 1546   K 4.4 03/30/2017 0508   CL 116 (H) 03/30/2017 0508   CO2 21 (L) 03/30/2017 0508   GLUCOSE 114 (H) 03/30/2017 0508   BUN 117 (H) 03/30/2017 0508   BUN 45 (H) 09/18/2015 1546   CREATININE 4.09 (H) 03/30/2017 0508   CREATININE 1.94 (H) 10/04/2015 1159   CALCIUM 8.8 (L) 03/30/2017 0508   PROT 5.5 (L) 03/29/2017 0239   ALBUMIN 2.3 (L) 03/29/2017 0239   AST 57 (H) 03/29/2017 0239   ALT 34 03/29/2017 0239   ALKPHOS 78 03/29/2017 0239   BILITOT 0.5 03/29/2017 0239   GFRNONAA 12 (L) 03/30/2017 0508   GFRAA 14 (L) 03/30/2017 0508   Lipase     Component Value Date/Time   LIPASE 32 03/26/2017 1512       Studies/Results: Dg Chest Port 1 View  Result Date: 03/30/2017 CLINICAL DATA:  Appendicitis with peritonitis EXAM: PORTABLE CHEST 1 VIEW COMPARISON:  03/29/2017 FINDINGS: Cardiac enlargement with AICD.  Negative for edema Mild bibasilar atelectasis unchanged.  No effusion. IMPRESSION: Mild bibasilar atelectasis unchanged. Electronically Signed   By: Franchot Gallo M.D.   On: 03/30/2017 07:48   Dg Chest Port 1 View  Result Date: 03/29/2017 CLINICAL DATA:  Appendicitis with peritonitis EXAM: PORTABLE CHEST 1 VIEW COMPARISON:  03/27/2017 FINDINGS: Left AICD remains in place, unchanged. Cardiomegaly with vascular congestion. Low lung volumes with bibasilar atelectasis. Possible slight interstitial edema with likely improvement since prior study. No significant visible layering effusions. IMPRESSION: Improving interstitial prominence, likely improving interstitial edema. Bibasilar atelectasis. Electronically Signed   By: Rolm Baptise M.D.   On: 03/29/2017  07:47    Anti-infectives: Anti-infectives    Start     Dose/Rate Route Frequency Ordered Stop   03/27/17 1230  meropenem (MERREM) 1 g in sodium chloride 0.9 % 100 mL IVPB     1 g 200 mL/hr over 30 Minutes Intravenous Every 24 hours 03/27/17 1153     03/26/17 2200  piperacillin-tazobactam (ZOSYN) IVPB 2.25 g  Status:  Discontinued     2.25 g 100 mL/hr over 30 Minutes Intravenous Every 8 hours 03/26/17 1914 03/27/17 1153   03/26/17 1600  piperacillin-tazobactam (ZOSYN) IVPB 3.375 g     3.375 g 100 mL/hr over 30  Minutes Intravenous  Once 03/26/17 1555 03/26/17 1811       Assessment/Plan AICD ICM HTN CAD s/p multi stents (E 35-40%) CHF HLD IDDM Acute on chronic kidney injury - Cr trending down 4.09. Lasix per nephrology Depression   S/p EXPLORATORY LAPAROTOMY, Ileocecectomy with ileocolic anastomosis 3/87 Dr. Dema Severin - POD 3 - WBC trending down 18.3, afebrile - no flatus or BM  ID - merrem 9/15>>day#4, zosyn 9/14>>9/15 FEN - IVF, NPO except sips/chips VTE - SCDs, heparin Foley - in place for strict I&O's Follow up - Dr. Dema Severin  Plan - Continue only sips/chips until bowel function returns. Encouraged patient get OOB more today and use IS. Continue PT, consult OT. Will also consult WOC for possible wound vac. Continue IV antibiotics. Labs in AM. Appreciate CCM and nephrology assistance with this patient.   LOS: 4 days    Wellington Hampshire , [facility] Surgery 03/30/2017, 8:25 AM Pager: [phone] Consults: [phone] Mon-Fri 7:00 am-4:30 pm Sat-Sun 7:00 am-11:30 am

## 2017-03-30 NOTE — Care Management Note (Signed)
Case Management Note  Patient Details  Name: Rodney Farley MRN: [id number] Date of Birth: 1931-10-09  Subjective/Objective:                    Action/Plan:  SW aware VAC placed Expected Discharge Date:  03/30/17               Expected Discharge Plan:  [place]  In-House Referral:  Clinical Social Work  Discharge planning Services     Post Acute Care Choice:    Choice offered to:     DME Arranged:    DME Agency:     HH Arranged:    [place] Agency:     Status of Service:  In process, will continue to follow  If discussed at Long Length of Stay Meetings, dates discussed:    Additional Comments:  Marilu Favre, RN 03/30/2017, 11:25 AM

## 2017-03-30 NOTE — Clinical Social Work Placement (Signed)
   CLINICAL SOCIAL WORK PLACEMENT  NOTE  Date:  03/30/2017  Patient Details  Name: Rodney Farley MRN: [id number] Date of Birth: 1931/11/23  Clinical Social Work is seeking post-discharge placement for this patient at the [place] level of care (*CSW will initial, date and re-position this form in  chart as items are completed):      Patient/family provided with [place] Work Department's list of facilities offering this level of care within the geographic area requested by the patient (or if unable, by the patient's family).  Yes   Patient/family informed of their freedom to choose among providers that offer the needed level of care, that participate in Medicare, Medicaid or managed care program needed by the patient, have an available bed and are willing to accept the patient.      Patient/family informed of [facility]'s ownership interest in [facility] and [facility], as well as of the fact that they are under no obligation to receive care at these facilities.  PASRR submitted to EDS on       PASRR number received on 03/30/17     Existing PASRR number confirmed on       FL2 transmitted to all facilities in geographic area requested by pt/family on 03/30/17     FL2 transmitted to all facilities within larger geographic area on       Patient informed that his/her managed care company has contracts with or will negotiate with certain facilities, including the following:        Yes   Patient/family informed of bed offers received.  Patient chooses bed at       Physician recommends and patient chooses bed at      Patient to be transferred to   on  .  Patient to be transferred to facility by       Patient family notified on   of transfer.  Name of family member notified:        PHYSICIAN       Additional Comment:    _______________________________________________ Eileen Stanford, LCSW 03/30/2017, 3:46 PM

## 2017-03-30 NOTE — NC FL2 (Signed)
[place] LEVEL OF CARE SCREENING TOOL     IDENTIFICATION  Patient Name: Rodney Farley Birthdate: November 02, 1931 Sex: male Admission Date (Current Location): 03/26/2017  [facility] and [place] Number:  Herbalist and Address:  [facility] [place] [address], [place], [place] [address]      Provider Number: [id number]  Attending Physician Name and Address:  Marshell Garfinkel, MD  Relative Name and Phone Number:       Current Level of Care: Hospital Recommended Level of Care: [place] Prior Approval Number:    Date Approved/Denied:   PASRR Number: [id number]  Discharge Plan: SNF    Current Diagnoses: Patient Active Problem List   Diagnosis Date Noted  . Oliguria   . Intra-abdominal abscess ([place])   . Ruptured appendicitis 03/26/2017  . Marital stress 02/18/2017  . Current use of long term anticoagulation - Plavix 06/10/2016  . Trigger finger 06/10/2016  . BPH associated with nocturia 04/27/2016  . Diabetic peripheral neuropathy ([place])   . COPD (chronic obstructive pulmonary disease) ([place])   . Bell's palsy   . Gout 06/26/2015  . AKI (acute kidney injury) ([place])   . Chronic systolic CHF (congestive heart failure) ([place]) 04/08/2015  . Diverticulitis 04/08/2015  . DOE (dyspnea on exertion), due to significant CAD 03/28/2015  . Chronic kidney disease (CKD), stage IV (severe) ([place]) 03/04/2015  . Coronary artery disease involving native coronary artery of native heart with angina pectoris ([place]) 09/13/2014  . GERD (gastroesophageal reflux disease) 08/07/2012  . Depression 08/05/2012  . Sleep apnea 08/05/2012  . Uncontrolled type 2 diabetes mellitus with stage 3 chronic kidney disease, with long-term current use of insulin ([place]) 08/05/2012  . HTN (hypertension) 08/05/2012  . Ischemic cardiomyopathy 11/03/2011  . Atrioventricular block, complete ([place])   . Obesity   . Pacemaker     Orientation RESPIRATION BLADDER  Height & Weight     Time, Situation, Place, Self  O2 (Nasal Cannula; 4L) Incontinent ([place] Cath: Placed 9/15) Weight: 232 lb (105.2 kg) Height:  5\' 7"  (170.2 cm)  BEHAVIORAL SYMPTOMS/MOOD NEUROLOGICAL BOWEL NUTRITION STATUS      Incontinent  (Please see d/c summary)  AMBULATORY STATUS COMMUNICATION OF NEEDS Skin   Extensive Assist Verbally- difficult to understand Surgical wounds, Wound Vac (Closed incision abdomen, ABD dressing, change twice a day. Laceration left elbow, foam dressing, change PRN. Wound vac to abdomen, 192mmHG, continous.)                       Personal Care Assistance Level of Assistance  Bathing, Feeding, Dressing Bathing Assistance: Maximum assistance Feeding assistance: Limited assistance Dressing Assistance: Maximum assistance     Functional Limitations Info  Sight, Hearing, Speech Sight Info: Adequate Hearing Info: Adequate Speech Info: Adequate    SPECIAL CARE FACTORS FREQUENCY  PT (By licensed PT), OT (By licensed OT)     PT Frequency: 3x week OT Frequency: 3x week            Contractures Contractures Info: Not present    Additional Factors Info  Code Status, Allergies Code Status Info: Full Code Allergies Info: Bee Venom, Lyrica Pregabalin, Prednisone, Zocor Simvastatin           Current Medications (03/30/2017):  This is the current hospital active medication list Current Facility-Administered Medications  Medication Dose Route Frequency Provider Last Rate Last Dose  . acetaminophen (TYLENOL) tablet 650 mg  650 mg Oral Q6H Greer Pickerel, MD  650 mg at 03/30/17 0711  . atorvastatin (LIPITOR) tablet 40 mg  40 mg Oral QHS Shirley, [name], DO   40 mg at 03/29/17 2141  . diphenhydrAMINE (BENADRYL) injection 12.5 mg  12.5 mg Intravenous Q6H PRN Ollis, Brandi L, NP       Or  . diphenhydrAMINE (BENADRYL) 12.5 MG/5ML elixir 12.5 mg  12.5 mg Oral Q6H PRN Ollis, Brandi L, NP      . fentaNYL 40 mcg/mL PCA injection   Intravenous Q4H Ollis,  Brandi L, NP      . heparin injection 5,000 Units  5,000 Units Subcutaneous Q8H Greer Pickerel, MD   5,000 Units at 03/30/17 [phone]  . insulin aspart (novoLOG) injection 0-9 Units  0-9 Units Subcutaneous Q4H Shirley, [name], DO   1 Units at 03/26/17 2055  . MEDLINE mouth rinse  15 mL Mouth Rinse BID Alveda Reasons, MD   15 mL at 03/29/17 2142  . meropenem (MERREM) 1 g in sodium chloride 0.9 % 100 mL IVPB  1 g Intravenous Q12H Skeet Simmer, RPH      . methocarbamol (ROBAXIN) 500 mg in dextrose 5 % 50 mL IVPB  500 mg Intravenous Q8H PRN Meuth, Brooke A, PA-C      . metoprolol tartrate (LOPRESSOR) injection 5 mg  5 mg Intravenous Q6H Roney Jaffe, MD   5 mg at 03/30/17 0707  . naloxone [facility]) injection 0.4 mg  0.4 mg Intravenous PRN Ollis, Brandi L, NP       And  . sodium chloride flush (NS) 0.9 % injection 9 mL  9 mL Intravenous PRN Ollis, Brandi L, NP      . ondansetron (ZOFRAN) injection 4 mg  4 mg Intravenous Q6H PRN Ollis, Brandi L, NP      . pantoprazole (PROTONIX) injection 40 mg  40 mg Intravenous Q24H Kara Mead V, MD   40 mg at 03/29/17 2358  . polyvinyl alcohol (LIQUIFILM TEARS) 1.4 % ophthalmic solution 1 drop  1 drop Both Eyes TID Shirley, [name], DO   1 drop at 03/29/17 2141  . prochlorperazine (COMPAZINE) injection 2.5 mg  2.5 mg Intravenous Q6H PRN Shirley, [name], DO      . sertraline (ZOLOFT) tablet 150 mg  150 mg Oral QHS Enid Derry, [name], DO   150 mg at 03/29/17 2141  . tamsulosin (FLOMAX) capsule 0.4 mg  0.4 mg Oral QPC supper Shirley, [name], DO   0.4 mg at 03/29/17 1856     Discharge Medications: Please see discharge summary for a list of discharge medications.  Relevant Imaging Results:  Relevant Lab Results:   Additional Information SSN: [id number]  Eileen Stanford, LCSW

## 2017-03-31 LAB — TYPE AND SCREEN
ABO/RH(D): A POS
Antibody Screen: POSITIVE
DAT, IgG: NEGATIVE
PT AG TYPE: NEGATIVE
UNIT DIVISION: 0
Unit division: 0

## 2017-03-31 LAB — CULTURE, BLOOD (ROUTINE X 2)
CULTURE: NO GROWTH
Culture: NO GROWTH
SPECIAL REQUESTS: ADEQUATE
Special Requests: ADEQUATE

## 2017-03-31 LAB — BPAM RBC
BLOOD PRODUCT EXPIRATION DATE: 201809232359
BLOOD PRODUCT EXPIRATION DATE: 201809252359
UNIT TYPE AND RH: 6200
Unit Type and Rh: 6200

## 2017-03-31 LAB — GLUCOSE, CAPILLARY
GLUCOSE-CAPILLARY: 155 mg/dL — AB (ref 65–99)
GLUCOSE-CAPILLARY: 184 mg/dL — AB (ref 65–99)
Glucose-Capillary: 149 mg/dL — ABNORMAL HIGH (ref 65–99)
Glucose-Capillary: 161 mg/dL — ABNORMAL HIGH (ref 65–99)
Glucose-Capillary: 164 mg/dL — ABNORMAL HIGH (ref 65–99)

## 2017-03-31 LAB — BASIC METABOLIC PANEL
Anion gap: 9 (ref 5–15)
BUN: 103 mg/dL — AB (ref 6–20)
CO2: 21 mmol/L — AB (ref 22–32)
CREATININE: 3.48 mg/dL — AB (ref 0.61–1.24)
Calcium: 8.6 mg/dL — ABNORMAL LOW (ref 8.9–10.3)
Chloride: 118 mmol/L — ABNORMAL HIGH (ref 101–111)
GFR calc Af Amer: 17 mL/min — ABNORMAL LOW (ref >60)
GFR calc non Af Amer: 15 mL/min — ABNORMAL LOW (ref >60)
Glucose, Bld: 170 mg/dL — ABNORMAL HIGH (ref 65–99)
POTASSIUM: 4.2 mmol/L (ref 3.5–5.1)
SODIUM: 148 mmol/L — AB (ref 135–145)

## 2017-03-31 LAB — CBC
HCT: 32.5 % — ABNORMAL LOW (ref 39.0–52.0)
HEMOGLOBIN: 10.7 g/dL — AB (ref 13.0–17.0)
MCH: 30.1 pg (ref 26.0–34.0)
MCHC: 32.9 g/dL (ref 30.0–36.0)
MCV: 91.5 fL (ref 78.0–100.0)
Platelets: 287 10*3/uL (ref 150–400)
RBC: 3.55 MIL/uL — AB (ref 4.22–5.81)
RDW: 14.7 % (ref 11.5–15.5)
WBC: 19.4 10*3/uL — ABNORMAL HIGH (ref 4.0–10.5)

## 2017-03-31 MED ORDER — PANTOPRAZOLE SODIUM 40 MG PO TBEC
40.0000 mg | DELAYED_RELEASE_TABLET | Freq: Every day | ORAL | Status: DC
  Administered 2017-03-31 – 2017-04-05 (×6): 40 mg via ORAL
  Filled 2017-03-31 (×6): qty 1

## 2017-03-31 MED ORDER — FENTANYL CITRATE (PF) 100 MCG/2ML IJ SOLN
25.0000 ug | INTRAMUSCULAR | Status: DC | PRN
  Administered 2017-03-31 – 2017-04-01 (×4): 25 ug via INTRAVENOUS
  Filled 2017-03-31 (×4): qty 2

## 2017-03-31 MED ORDER — SODIUM CHLORIDE 0.9 % IV SOLN
3.0000 g | Freq: Two times a day (BID) | INTRAVENOUS | Status: DC
  Administered 2017-03-31 – 2017-04-04 (×7): 3 g via INTRAVENOUS
  Filled 2017-03-31 (×9): qty 3

## 2017-03-31 MED ORDER — DEXTROSE-NACL 5-0.45 % IV SOLN
INTRAVENOUS | Status: DC
  Administered 2017-03-31: 08:00:00 via INTRAVENOUS

## 2017-03-31 MED ORDER — DEXTROSE 5 % IV SOLN
INTRAVENOUS | Status: DC
  Administered 2017-03-31 – 2017-04-01 (×4): via INTRAVENOUS

## 2017-03-31 NOTE — Progress Notes (Signed)
[facility] Progress Note  4 Days Post-Op  Subjective: CC:  Denies abdominal pain. Reports SOB at rest, this is baseline for him. Eating ice chips. Denies fever, chills, nausea, vomiting. Denies flatus or BM. Mobilizing with therapies.   WBC 19. Afebrile.  Objective: Vital signs in last 24 hours: Temp:  [98.3 F (36.8 C)-98.6 F (37 C)] 98.3 F (36.8 C) (09/19 0516) Pulse Rate:  [69-70] 69 (09/19 0516) Resp:  [17-28] 25 (09/19 0803) BP: (141-172)/(62-69) 172/69 (09/19 0516) SpO2:  [93 %-97 %] 95 % (09/19 0803) Weight:  [106.1 kg (233 lb 14.5 oz)] 106.1 kg (233 lb 14.5 oz) (09/19 0500) Last BM Date: 03/26/17  Intake/Output from previous day: 09/18 0701 - 09/19 0700 In: 1279 [I.V.:1079; IV Piggyback:200] Out: 3550 [Urine:3550] Intake/Output this shift: No intake/output data recorded.  PE: Gen:  Alert, NAD HEENT: EOM's intact, pupils equal and round Card:  RRR, no M/G/R heard Pulm:  CTAB, no W/R/R, effort normal. Pulling 500 on IS Abd: Soft, NT, mild distension, +BS, midline incision with VAC in place Psych: A&Ox3  Skin: no rashes noted, warm and dry  Lab Results:   Recent Labs  03/30/17 0508 03/31/17 0518  WBC 18.3* 19.4*  HGB 10.7* 10.7*  HCT 32.1* 32.5*  PLT 282 287   BMET  Recent Labs  03/30/17 0508 03/31/17 0518  NA 146* 148*  K 4.4 4.2  CL 116* 118*  CO2 21* 21*  GLUCOSE 114* 170*  BUN 117* 103*  CREATININE 4.09* 3.48*  CALCIUM 8.8* 8.6*   PT/INR No results for input(s): LABPROT, INR in the last 72 hours. CMP     Component Value Date/Time   NA 148 (H) 03/31/2017 0518   NA 139 09/18/2015 1546   K 4.2 03/31/2017 0518   CL 118 (H) 03/31/2017 0518   CO2 21 (L) 03/31/2017 0518   GLUCOSE 170 (H) 03/31/2017 0518   BUN 103 (H) 03/31/2017 0518   BUN 45 (H) 09/18/2015 1546   CREATININE 3.48 (H) 03/31/2017 0518   CREATININE 1.94 (H) 10/04/2015 1159   CALCIUM 8.6 (L) 03/31/2017 0518   PROT 5.5 (L) 03/29/2017 0239   ALBUMIN 2.3 (L)  03/29/2017 0239   AST 57 (H) 03/29/2017 0239   ALT 34 03/29/2017 0239   ALKPHOS 78 03/29/2017 0239   BILITOT 0.5 03/29/2017 0239   GFRNONAA 15 (L) 03/31/2017 0518   GFRAA 17 (L) 03/31/2017 0518   Lipase     Component Value Date/Time   LIPASE 32 03/26/2017 1512       Studies/Results: Dg Chest Port 1 View  Result Date: 03/30/2017 CLINICAL DATA:  Appendicitis with peritonitis EXAM: PORTABLE CHEST 1 VIEW COMPARISON:  03/29/2017 FINDINGS: Cardiac enlargement with AICD.  Negative for edema Mild bibasilar atelectasis unchanged.  No effusion. IMPRESSION: Mild bibasilar atelectasis unchanged. Electronically Signed   By: Franchot Gallo M.D.   On: 03/30/2017 07:48    Anti-infectives: Anti-infectives    Start     Dose/Rate Route Frequency Ordered Stop   03/30/17 1200  meropenem (MERREM) 1 g in sodium chloride 0.9 % 100 mL IVPB     1 g 200 mL/hr over 30 Minutes Intravenous Every 12 hours 03/30/17 1127     03/27/17 1230  meropenem (MERREM) 1 g in sodium chloride 0.9 % 100 mL IVPB  Status:  Discontinued     1 g 200 mL/hr over 30 Minutes Intravenous Every 24 hours 03/27/17 1153 03/30/17 1127   03/26/17 2200  piperacillin-tazobactam (ZOSYN) IVPB 2.25 g  Status:  Discontinued     2.25 g 100 mL/hr over 30 Minutes Intravenous Every 8 hours 03/26/17 1914 03/27/17 1153   03/26/17 1600  piperacillin-tazobactam (ZOSYN) IVPB 3.375 g     3.375 g 100 mL/hr over 30 Minutes Intravenous  Once 03/26/17 1555 03/26/17 1811       Assessment/Plan AICD ICM HTN CAD s/p multi stents (E 35-40%) CHF HLD IDDM Acute on chronic kidney injury - Cr trending down 4.09. Lasix per nephrology Depression  S/p EXPLORATORY LAPAROTOMY, Ileocecectomy with ileocolic anastomosis 4/16 Dr. Dema Severin - POD #4 - WBC increased to 19.4 today, afebrile - no flatus or BM - pain: scheduled tylenol, IV robaxin PRN, Fentanyl PCA - OOB/mobilize!  - Incentive spirometry q 1h!   ID - merrem 9/15>>day#5, zosyn 9/14>>9/15 FEN -  IVF, NPO except sips/chips VTE - SCDs, heparin Foley - in place for strict I&O's Follow up - Dr. Dema Severin  Plan - expected post-op ileus. Continue ice chips and await further bowel function. Encourage OOB and mobilization. Follow WBC - will likely require repeat CT Abd in ~48 h.   LOS: 5 days    Jill Alexanders , [facility] Surgery 03/31/2017, 9:46 AM Pager: [phone] Consults: [phone] Mon-Fri 7:00 am-4:30 pm Sat-Sun 7:00 am-11:30 am

## 2017-03-31 NOTE — Progress Notes (Signed)
[facility] Progress Note  Subjective: creat down 3.5   Vitals:   03/31/17 0450 03/31/17 0500 03/31/17 0516 03/31/17 0803  BP:   (!) 172/69   Pulse:   69   Resp: 20  18 (!) 25  Temp:   98.3 F (36.8 C)   TempSrc:   Oral   SpO2: 93%  96% 95%  Weight:  106.1 kg (233 lb 14.5 oz)    Height:        Inpatient medications: . acetaminophen  650 mg Oral Q6H  . atorvastatin  40 mg Oral QHS  . heparin subcutaneous  5,000 Units Subcutaneous Q8H  . insulin aspart  0-9 Units Subcutaneous Q4H  . mouth rinse  15 mL Mouth Rinse BID  . metoprolol tartrate  5 mg Intravenous Q6H  . pantoprazole  40 mg Oral QHS  . polyvinyl alcohol  1 drop Both Eyes TID  . sertraline  150 mg Oral QHS  . tamsulosin  0.4 mg Oral QPC supper   . dextrose 100 mL/hr at 03/31/17 1047  . meropenem (MERREM) IV 1 g (03/31/17 1251)   fentaNYL (SUBLIMAZE) injection, prochlorperazine  Exam: Gen awake, up in chair No jvd or bruits Chest clear bilat to bases RRR soft sem , no RG Abd dressing in place, not removed, dec'd BS GU normal male w foley in place MS no joint effusions or deformity Ext some doughy dependent hip edema Neuro is alert, Ox 3 , nf   UA > 100 prot, o/w negative  Home meds: -coreg 12.5 bid/ hydralazine 37.5 mg bid/ imdur 30 qd/ lasix 40 am and 20 pm -asa/ statin/ plavix/ lantus / NTG/ PPI/ zoloft/ flomax/ vitamins/ wellbutrin XL   Date                Creat               eGFR 2010- 2015      1.5- 1.9 2016                1.8-  2.5 2017                1.9- 2.9            23- 09 Feb 2017          2.91        Impression: 1.  Acute on CRF - baseline creat 3.0. Creat down today to 3.5, good UOP, vol status stable.  No further suggestions, will sign off.  2.  Ruptured appendix - sp OR 9/16 3.  Ischemic CM/ hx CAD sp mult stents/ EF 35-40% last echo  - slight vol excess, better after IV lasix yest, will hold lasix today, cont to monitor 4.  DM on insulin 5.  HTN - on IV MTP  scheduled for now, when eating can restart home bp meds (hydral, coreg, +/- imdur) 6.  Hypernatremia - Na 148 , added D5W at 100/ hr for correction.  Can dc D5W when serum Na better.    Plan - as above  Kelly Splinter MD [facility] pager [phone]   03/31/2017, 12:52 PM    Recent Labs Lab 03/29/17 0239 03/30/17 0508 03/31/17 0518  NA 143 146* 148*  K 4.9 4.4 4.2  CL 116* 116* 118*  CO2 16* 21* 21*  GLUCOSE 122* 114* 170*  BUN 114* 117* 103*  CREATININE 5.17* 4.09* 3.48*  CALCIUM 8.5* 8.8* 8.6*  PHOS  --  4.8*  --  Recent Labs Lab 03/26/17 1512 03/27/17 2120 03/29/17 0239  AST 22 59* 57*  ALT 34 33 34  ALKPHOS 114 52 78  BILITOT 0.7 0.7 0.5  PROT 7.2 4.4* 5.5*  ALBUMIN 3.4* 2.0* 2.3*    Recent Labs Lab 03/25/17 1309  03/29/17 0239 03/30/17 0508 03/31/17 0518  WBC 17.3*  < > 19.5* 18.3* 19.4*  NEUTROABS 14.0*  --   --   --   --   HGB 11.5*  < > 10.3* 10.7* 10.7*  HCT 35.7*  < > 31.6* 32.1* 32.5*  MCV 92.7  < > 92.1 90.9 91.5  PLT 301.0  < > 261 282 287  < > = values in this interval not displayed. Iron/TIBC/Ferritin/ %Sat No results found for: IRON, TIBC, FERRITIN, IRONPCTSAT

## 2017-03-31 NOTE — Progress Notes (Signed)
Family Medicine Teaching Service Daily Progress Note Intern Pager: [phone]  Patient name: Rodney Farley Medical record number: [id number] Date of birth: [birth year] Age: 83 y.o. Gender: male  Primary Care Provider: [name], MD Consultants: Nephrology Code Status: FULL  Pt Overview and Major Events to Date:  9/15  s/p ex lap with appendectomy and wash out 9/16  PCA initiated for pain control  9/17  Hemodynamically stable, transferred to med-surg  Assessment and Plan:  Ruptured appendicitis s/p ileocecectomy: stable. Reports no bm or flatus.  - Follow surgery recs for wound care and advancing diet. - Continue Meropenem (9/15-) suggested length of treatment is 7 days with last dose on 09/21, received Zosyn (9/14-9/15) - Remains NPO - Incentive spirometry q 1h - Zofran PRN discontinued due to history of prolonged QTC, has prn compazine - Protonix 40mg  IV- will switch to oral - Continue scheduled Tylenol - d/c Fentanyl PCA for pain control- had 70 mcg yesterday per MAR> fentanyl 25 mcg injections q2 prn - PT to see to help with mobilization - WBC at 18.3> 19.4 on 09/19 - Repeat CT Abd on 09/21  AKI on CAD stage IV: Improving, still above baseline of 2.74 - Creatinine 3.48 on 09/19 - Appreciate nephrology recs - Holding Lasix while NPO on maintenance fluids  HFpEF: Not fluid overloaded on admission, has total output since admission of 1.5 L. Weight on admission was 221 lb > 210> 233 on 09/19. Could be due to inaccurate weights. Given total output decreased since admission.  - Continuing to hold Lasix for now.  - On maintenance fluids of D5W @ 100 mL/hr- monitor fluid status - On Metoprolol 5mg  IV q6  Hypernatremia: Na today at 148. - Changed continuous IVF from D5NS at 60 mL/hr to D5W at 100 mL/hr - Monitor  HTN: Blood pressure has remained elevated for past 24-48 hours with last BP of 172/69 - Consider adding in BP med for better control.  T2DM: Blood  glucose has been well controlled without Lantus. - SSI - Continue to hold Lantus while NPO  MDD: Patient's wife recently left him 1 month ago. Being treated with Zoloft. Likely switch to Wellbutrin at discharge - Continue Zoloft home dose for now  H/o complete AV block s/p pacemaker: Stable.  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 - Reordered CPAP at night  BPH: Stable. - Continue home flomax  HLD: Stable.  - Restarted home Atorvastatin 40mg   GERD: Stable. - Continue home Protonix- changed to PO  FEN/GI: NPO, except sips with pills and ice chips PPx: Heparin  Disposition: continue to watch, inpatient  Subjective:  Patient upset this am regarding not getting out of bed. He thinks he has been in bed for 8 days and has not been allowed to move. Patient is comforted and ensured he will be moved more today, however it is reported that he is getting into the chair. He is also upset that we have not shown him his lab work and progress. I will print off labs and go over them with him today. He is requesting ice chips, but says he is feeling better today.   Objective: Temp:  [98.3 F (36.8 C)-98.6 F (37 C)] 98.3 F (36.8 C) (09/19 0516) Pulse Rate:  [69-70] 69 (09/19 0516) Resp:  [17-28] 18 (09/19 0516) BP: (141-172)/(62-69) 172/69 (09/19 0516) SpO2:  [93 %-97 %] 96 % (09/19 0516) Weight:  [233 lb 14.5 oz (  106.1 kg)] 233 lb 14.5 oz (106.1 kg) (09/19 0500) Physical Exam: General: NAD, appears sleepy, upset Cardiovascular: RRR, normal S1, S2, no murmurs Respiratory: CTAB, no wheezes in frontal lung fields Abdomen: soft, mild distention, RUQ and RLQ extremely TTP, midline abdominal surgical wound is bandaged Extremities: no cyanosis or edema Neuro: A&O x3, appropriate affect, appears confused  Laboratory:  Recent Labs Lab 03/29/17 0239 03/30/17 0508 03/31/17 0518  WBC 19.5* 18.3* 19.4*  HGB 10.3* 10.7*  10.7*  HCT 31.6* 32.1* 32.5*  PLT 261 282 287    Recent Labs Lab 03/26/17 1512  03/27/17 2120  03/29/17 0239 03/30/17 0508 03/31/17 0518  NA 135  < > 143  < > 143 146* 148*  K 4.8  < > 4.2  < > 4.9 4.4 4.2  CL 100*  < > 119*  < > 116* 116* 118*  CO2 22  < > 15*  < > 16* 21* 21*  BUN 79*  < > 82*  < > 114* 117* 103*  CREATININE 3.77*  < > 4.49*  < > 5.17* 4.09* 3.48*  CALCIUM 9.2  < > 6.2*  < > 8.5* 8.8* 8.6*  PROT 7.2  --  4.4*  --  5.5*  --   --   BILITOT 0.7  --  0.7  --  0.5  --   --   ALKPHOS 114  --  52  --  78  --   --   ALT 34  --  33  --  34  --   --   AST 22  --  59*  --  57*  --   --   GLUCOSE 143*  < > 89  < > 122* 114* 170*  < > = values in this interval not displayed.  9/14 - BCx neg x 2  Imaging/Diagnostic Tests: CT ABD/Pelvis 9/14 >> 8.3 x 5.6 cm rounded complex abnormality seen adjacent to inferior portion of cecum with mild surrounding inflammation, enlarged and inflamed appendix, significant wall thickening of the terminal ileum, multiple exophytic cysts in both kidneys Renal US 9/14 >> no evidence of hydronephrosis, bilateral renal cysts  9/15 - OR Findings: Purulent fluid upon peritoneal entry - cultures sent. Fat containing ventral hernia ~4x4cm which was included in midline incision but ultimately closed primarily as part of the fascial closure. Appendix and cecum completely stuck to retroperitoneum in the right lower quadrant. Lateral wall of cecum and base of appendix appeared diseased and necrotic which necessitated an ileocecectomy. Ascending colon and distal ileum were normal appearing and healthy.  Seydou Hearns, [name], DO 03/31/2017, 7:11 AM PGY-1, [place] Intern pager: [phone], text pages welcome

## 2017-03-31 NOTE — Progress Notes (Signed)
Witnessed Derald Macleod, RN, wast 10cc of pca fentanyl in the sink.

## 2017-03-31 NOTE — Progress Notes (Signed)
Pt states that he does not wear CPAP or oxygen at home during hours of sleep.  Pt declined CPAP use.  Pt resting comfortably with no distress.  RN made aware no CPAP in room.

## 2017-03-31 NOTE — Progress Notes (Signed)
Provided bed offers to patient  CSW will continue to follow  Jorge Ny, [place] Social Worker [phone]

## 2017-03-31 NOTE — Progress Notes (Signed)
Wasted 10 mls of Fentanyl IV to sink with Barrett Henle Rn

## 2017-04-01 LAB — CBC
HEMATOCRIT: 32.1 % — AB (ref 39.0–52.0)
HEMOGLOBIN: 10.3 g/dL — AB (ref 13.0–17.0)
MCH: 29.3 pg (ref 26.0–34.0)
MCHC: 32.1 g/dL (ref 30.0–36.0)
MCV: 91.5 fL (ref 78.0–100.0)
PLATELETS: 283 10*3/uL (ref 150–400)
RBC: 3.51 MIL/uL — AB (ref 4.22–5.81)
RDW: 14.8 % (ref 11.5–15.5)
WBC: 19.4 10*3/uL — AB (ref 4.0–10.5)

## 2017-04-01 LAB — BASIC METABOLIC PANEL
ANION GAP: 9 (ref 5–15)
BUN: 91 mg/dL — ABNORMAL HIGH (ref 6–20)
CHLORIDE: 115 mmol/L — AB (ref 101–111)
CO2: 22 mmol/L (ref 22–32)
CREATININE: 2.83 mg/dL — AB (ref 0.61–1.24)
Calcium: 8.4 mg/dL — ABNORMAL LOW (ref 8.9–10.3)
GFR calc non Af Amer: 19 mL/min — ABNORMAL LOW (ref >60)
GFR, EST AFRICAN AMERICAN: 22 mL/min — AB (ref >60)
Glucose, Bld: 195 mg/dL — ABNORMAL HIGH (ref 65–99)
POTASSIUM: 3.9 mmol/L (ref 3.5–5.1)
Sodium: 146 mmol/L — ABNORMAL HIGH (ref 135–145)

## 2017-04-01 LAB — GLUCOSE, CAPILLARY
GLUCOSE-CAPILLARY: 166 mg/dL — AB (ref 65–99)
GLUCOSE-CAPILLARY: 190 mg/dL — AB (ref 65–99)
GLUCOSE-CAPILLARY: 229 mg/dL — AB (ref 65–99)
Glucose-Capillary: 179 mg/dL — ABNORMAL HIGH (ref 65–99)
Glucose-Capillary: 182 mg/dL — ABNORMAL HIGH (ref 65–99)
Glucose-Capillary: 166 mg/dL — ABNORMAL HIGH (ref 65–99)

## 2017-04-01 MED ORDER — FENTANYL CITRATE (PF) 100 MCG/2ML IJ SOLN
12.5000 ug | INTRAMUSCULAR | Status: DC | PRN
  Administered 2017-04-01 – 2017-04-02 (×4): 12.5 ug via INTRAVENOUS
  Filled 2017-04-01 (×4): qty 2

## 2017-04-01 MED ORDER — INSULIN ASPART 100 UNIT/ML ~~LOC~~ SOLN
0.0000 [IU] | Freq: Three times a day (TID) | SUBCUTANEOUS | Status: DC
  Administered 2017-04-01: 3 [IU] via SUBCUTANEOUS
  Administered 2017-04-02: 2 [IU] via SUBCUTANEOUS
  Administered 2017-04-02: 3 [IU] via SUBCUTANEOUS
  Administered 2017-04-02: 2 [IU] via SUBCUTANEOUS
  Administered 2017-04-03: 1 [IU] via SUBCUTANEOUS
  Administered 2017-04-03 – 2017-04-05 (×7): 2 [IU] via SUBCUTANEOUS
  Administered 2017-04-05: 1 [IU] via SUBCUTANEOUS
  Administered 2017-04-06 (×2): 2 [IU] via SUBCUTANEOUS

## 2017-04-01 MED ORDER — CARVEDILOL 6.25 MG PO TABS
6.2500 mg | ORAL_TABLET | Freq: Two times a day (BID) | ORAL | Status: DC
  Administered 2017-04-01 – 2017-04-06 (×10): 6.25 mg via ORAL
  Filled 2017-04-01 (×10): qty 1

## 2017-04-01 MED ORDER — INSULIN ASPART 100 UNIT/ML ~~LOC~~ SOLN: 0.0000 [IU] | Freq: Every day | SUBCUTANEOUS | Status: DC

## 2017-04-01 NOTE — Progress Notes (Signed)
Pt complained of nausea, PRN compazine given. Vital signs checked. Will monitor pt.

## 2017-04-01 NOTE — Consult Note (Signed)
[facility] CM Primary Care Navigator  04/01/2017  Rodney Farley [birth year] [id number]   Met with patient at the bedside to identify possible discharge needs. Patient reports having "severe abdominal pain"  thathadled to this admission/ surgery.  Patient endorses Dr. Garret Reddish with [facility] [place] as hisprimary care provider.   Patient shared using [facility] [address] without difficulty.   Patient states managinghisown medications at home with use of "pill box" system filled weekly.  Patient reports that he was driving up until this admission. His son [name]), daughter in-law [name]) or granddaughters [name] and [name]) will be able to provide transportationto his doctors'appointments after discharge.  Patient lives at home alone. Son and his family live close by per patient. He is unsure who will provide assistance with care at home after discharge since son and daughter in-law work.    Patient agreed to have a [facility] list of personal care services to use as a resource for future needs, with the understanding that it has to be paid out of the pocket.  Anticipated discharge plan is skilled nursing facility (SNF-still in process) for rehabilitation per therapy recommendation.  Patient voiced understanding to call primary care provider's officewhen hereturns backhome,for a post discharge follow-up within a week or sooner if needs arise.Patient letter (with PCP's contact number) was provided as areminder.   Discussed with patient regarding [facility] CM services available for health management at home and he expressed understanding to seekreferral from primary care provider to [facility] care management ifdeemed necessary and appropriatefor services in the future-once discharged back to home.  [facility] care management information was provided for future needs that may arise.  For questions, please  contact:  Dannielle Huh, BSN, RN- [facility] Primary Care Navigator  Telephone: [phone] [place]

## 2017-04-01 NOTE — Progress Notes (Signed)
Physical Therapy Treatment Patient Details Name: Rodney Farley MRN: [id number] DOB: [birth year] Today's Date: 04/01/2017    History of Present Illness 83 y.o. male presenting with RLQ abdominal pain found to have ruptured appendix with abscess. S/P ex lap, appendectomy midline fascia closed and skin left open with ABD pads secured by tape.  Briefly required neosynephrine for OR.  Extubated and returned to ICU / off pressors.     PT Comments    Patient able to participate in multiple transfers this session with +2 A for lifting for sit to stand and +1 for pivotal steps with walker.  Continues to require encouragement to increased participation with activities and for [place].  Patient remains appropriate for SNF level therapies at d/c.  PT to follow acutely.    Follow Up Recommendations  SNF     Equipment Recommendations  Other (comment) (TBA)    Recommendations for Other Services       Precautions / Restrictions Precautions Precautions: Fall Precaution Comments: abdominal wound vac Restrictions Weight Bearing Restrictions: No    Mobility  Bed Mobility Overal bed mobility: Needs Assistance Bed Mobility: Rolling;Sidelying to Sit Rolling: Min assist;+2 for physical assistance Sidelying to sit: Mod assist;+2 for physical assistance       General bed mobility comments: VCs for sequencing  Transfers Overall transfer level: Needs assistance Equipment used: Rolling walker (2 wheeled) Transfers: Sit to/from [facility] Sit to Stand: Mod assist;+2 physical assistance Stand pivot transfers: Mod assist;+2 safety/equipment Squat pivot transfers: Mod assist;+2 safety/equipment     General transfer comment: patient needing lifting help at least initially coming up to stand with max cues and encouragement; stand pivot to [facility] and to chair with mod A of 1 + second for safety with lines and chair placement.  Ambulation/Gait             General Gait Details:  unable   Stairs            Wheelchair Mobility    Modified Rankin (Stroke Patients Only)       Balance Overall balance assessment: Needs assistance Sitting-balance support: No upper extremity supported;Feet supported Sitting balance-Leahy Scale: Fair Sitting balance - Comments: seated without UE support EOB, but demonstrates limited ability to accept challenges   Standing balance support: Bilateral upper extremity supported Standing balance-Leahy Scale: Poor Standing balance comment: reliant on Bil UE support and support of therapist in standing                            Cognition Arousal/Alertness: Awake/alert Behavior During Therapy: Anxious Overall Cognitive Status: Within Functional Limits for tasks assessed                                        Exercises      General Comments        Pertinent Vitals/Pain Pain Assessment: Faces Faces Pain Scale: Hurts little more Pain Location: abdominal incision site Pain Descriptors / Indicators: Sore Pain Intervention(s): Limited activity within patient's tolerance;Monitored during session;Repositioned;RN gave pain meds during session    Home Living Family/patient expects to be discharged to:: Skilled nursing facility                    Prior Function            PT Goals (current goals can now be found  in the care plan section) Acute Rehab PT Goals Patient Stated Goal: to go back to bed (after he had just gotten up) Progress towards PT goals: Progressing toward goals    Frequency    Min 3X/week      PT Plan Current plan remains appropriate    Co-evaluation PT/OT/SLP Co-Evaluation/Treatment: Yes Reason for Co-Treatment: For patient/therapist safety;To address functional/ADL transfers PT goals addressed during session: Mobility/safety with mobility;Balance;Proper use of DME OT goals addressed during session: Strengthening/ROM;ADL's and self-care      AM-PAC PT "6  Clicks" Daily Activity  Outcome Measure    Difficulty moving from lying on back to sitting on the side of the bed? : Unable Difficulty sitting down on and standing up from a chair with arms (e.g., wheelchair, bedside commode, etc,.)?: Unable Help needed moving to and from a bed to chair (including a wheelchair)?: Total Help needed walking in hospital room?: A Lot Help needed climbing 3-5 steps with a railing? : Total 6 Click Score: 6    End of Session Equipment Utilized During Treatment: Gait belt Activity Tolerance: Patient tolerated treatment well Patient left: in chair;with call bell/phone within reach;with chair alarm set   PT Visit Diagnosis: Unsteadiness on feet (R26.81);Other abnormalities of gait and mobility (R26.89);Muscle weakness (generalized) (M62.81);Difficulty in walking, not elsewhere classified (R26.2)     Time: 7622-6333 PT Time Calculation (min) (ACUTE ONLY): 51 min  Charges:  $Therapeutic Activity: 8-22 mins                    G CodesMagda Farley, [place] [phone] 04/01/2017    Rodney Farley 04/01/2017, 2:14 PM

## 2017-04-01 NOTE — Progress Notes (Signed)
Patient refused CPAP tonight. There Isn't a machine in the room at this time Patient resting and doesn't wear at home.

## 2017-04-01 NOTE — Progress Notes (Signed)
Family Medicine Teaching Service Daily Progress Note Intern Pager: [phone]  Patient name: Rodney Farley Medical record number: [id number] Date of birth: [birth year] Age: 83 y.o. Gender: male  Primary Care Provider: [name], MD Consultants: Nephrology Code Status: FULL  Pt Overview and Major Events to Date:  9/15  s/p ex lap with appendectomy and wash out 9/16  PCA initiated for pain control  9/17  Hemodynamically stable, transferred to med-surg  Assessment and Plan:  Ruptured appendicitis s/p ileocecectomy: stable. Reports no bm and possible small amount of flatus. Patient had BM after I saw him. - Appreciate surgery recs for wound care and advancing diet. - Narrowed to Unasyn (9/19-) due to E. Coli from culture; stopped Meropenem (9/15-19),received Zosyn (9/14-9/15) - Diet advanced to clear liquid diet per surgery after 2 bm's - Incentive spirometry q 1h - Protonix 40mg  po - Continue scheduled Tylenol and decreased to fentanyl 12.5 mcg injections q2 prn for pain - PT/OT following - WBC at 18.3> 19.4 on 09/20 - Repeat CT Abd possibly on 09/21  AKI on CAD stage IV: Improving, still above baseline of 2.74 - Creatinine 2.83 on 09/20 - Appreciate nephrology recs - Holding Lasix with new CLD with maintenance fluids  HFpEF: Not fluid overloaded on admission, has total output since admission of 1.7 L. Weight on admission was 221 lb > 210> 231 on 09/20. Could be due to inaccurate weights. Given total output decreased since admission.  - Continuing to hold Lasix for now.  - On maintenance fluids of D5W @ 100 mL/hr- monitor fluid status - On Metoprolol 5mg  IV q6- discontinue and will start decreased home coreg dosage  Hypernatremia: Improving. Na today at 146. - On D5W at 100 mL/hr, now with CLD- will decrease to 26mL/hr - Monitor  HTN: Blood pressure has remained minimally elevated for past 24-48 hours with last BP of 146/71 - On Metoprolol 5mg  IV q6- discontinue  and will start decreased home coreg dosage at 6.25 BID  T2DM: Blood glucose has been well controlled without Lantus. - SSI- received 13 U total yesterday - Continue to hold Lantus to see how much patient intakes on clear liquid diet  MDD: Patient's wife recently left him 1 month ago. Being treated with Zoloft. Likely switch to Wellbutrin at discharge - Continue Zoloft home dose for now  H/o complete AV block s/p pacemaker: Stable.  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 - Reordered CPAP at night  BPH: Stable. - Continue home flomax  HLD: Stable.  - Restarted home Atorvastatin 40mg   GERD: Stable. - Continue home Protonix- changed to PO  FEN/GI: Clear liquid diet PPx: Heparin  Disposition: continue to watch, inpatient  Subjective:  Patient feels much better today. Reports he just wants to drink something other than water or ice chips. He is having less pain. He reports some flatus, but not enough for him to be sure if it was flatus.   Objective: Temp:  [98 F (36.7 C)-98.4 F (36.9 C)] 98 F (36.7 C) (09/20 0500) Pulse Rate:  [70-85] 72 (09/20 0500) Resp:  [18-23] 18 (09/20 0500) BP: (146-165)/(60-71) 146/71 (09/20 0500) SpO2:  [91 %-96 %] 94 % (09/20 0500) Weight:  [231 lb 3.2 oz (104.9 kg)] 231 lb 3.2 oz (104.9 kg) (09/20 0500) Physical Exam: General: NAD, pleasant Cardiovascular: RRR, normal S1, S2, no murmurs Respiratory: CTAB, no wheezes in frontal lung fields Abdomen: soft, mild distention, RUQ and  RLQ less TTP, midline abdominal surgical wound with wound vac Extremities: no cyanosis or edema Neuro: A&O x3, appropriate affect, much less confused  Laboratory:  Recent Labs Lab 03/30/17 0508 03/31/17 0518 04/01/17 0425  WBC 18.3* 19.4* 19.4*  HGB 10.7* 10.7* 10.3*  HCT 32.1* 32.5* 32.1*  PLT 282 287 283    Recent Labs Lab 03/26/17 1512  03/27/17 2120  03/29/17 0239 03/30/17 0508  03/31/17 0518 04/01/17 0425  NA 135  < > 143  < > 143 146* 148* 146*  K 4.8  < > 4.2  < > 4.9 4.4 4.2 3.9  CL 100*  < > 119*  < > 116* 116* 118* 115*  CO2 22  < > 15*  < > 16* 21* 21* 22  BUN 79*  < > 82*  < > 114* 117* 103* 91*  CREATININE 3.77*  < > 4.49*  < > 5.17* 4.09* 3.48* 2.83*  CALCIUM 9.2  < > 6.2*  < > 8.5* 8.8* 8.6* 8.4*  PROT 7.2  --  4.4*  --  5.5*  --   --   --   BILITOT 0.7  --  0.7  --  0.5  --   --   --   ALKPHOS 114  --  52  --  78  --   --   --   ALT 34  --  33  --  34  --   --   --   AST 22  --  59*  --  57*  --   --   --   GLUCOSE 143*  < > 89  < > 122* 114* 170* 195*  < > = values in this interval not displayed.  9/14 - BCx neg x 2  Imaging/Diagnostic Tests: CT ABD/Pelvis 9/14 >> 8.3 x 5.6 cm rounded complex abnormality seen adjacent to inferior portion of cecum with mild surrounding inflammation, enlarged and inflamed appendix, significant wall thickening of the terminal ileum, multiple exophytic cysts in both kidneys Renal US 9/14 >> no evidence of hydronephrosis, bilateral renal cysts  9/15 - OR Findings: Purulent fluid upon peritoneal entry - cultures sent. Fat containing ventral hernia ~4x4cm which was included in midline incision but ultimately closed primarily as part of the fascial closure. Appendix and cecum completely stuck to retroperitoneum in the right lower quadrant. Lateral wall of cecum and base of appendix appeared diseased and necrotic which necessitated an ileocecectomy. Ascending colon and distal ileum were normal appearing and healthy.  Shyna Duignan, [name], DO 04/01/2017, 11:01 AM PGY-1, [place] Intern pager: [phone], text pages welcome

## 2017-04-01 NOTE — Evaluation (Signed)
Occupational Therapy Evaluation Patient Details Name: Rodney Farley MRN: [id number] DOB: [birth year] Today's Date: 04/01/2017    History of Present Illness 83 y.o. male presenting with RLQ abdominal pain found to have ruptured appendix with abscess. S/P ex lap, appendectomy midline fascia closed and skin left open with ABD pads secured by tape.  Briefly required neosynephrine for OR.  Extubated and returned to ICU / off pressors.    Clinical Impression   This 83 yo male admitted with above presents to acute OT with increased pain, abdominal wound vac,  and decreased mobility both affecting his ability to do his basic ADLs with him being Independent pta. He will benefit from acute OT with follow up OT at SNF to get back to PLOF and return home.    Follow Up Recommendations  SNF;Supervision/Assistance - 24 hour    Equipment Recommendations  Other (comment) (TBD at next venue)       Precautions / Restrictions Precautions Precautions: Fall Precaution Comments: abdominal wound vac Restrictions Weight Bearing Restrictions: No      Mobility Bed Mobility Overal bed mobility: Needs Assistance Bed Mobility: Rolling;Sidelying to Sit Rolling: Min assist;+2 for physical assistance Sidelying to sit: Mod assist;+2 for physical assistance       General bed mobility comments: VCs for sequencing  Transfers Overall transfer level: Needs assistance Equipment used: Rolling walker (2 wheeled) Transfers: Sit to/from [facility] Sit to Stand: Mod assist;+2 physical assistance   Squat pivot transfers: Mod assist;+2 safety/equipment     General transfer comment: pt able to stand and pivot with RW, VC's for safe hand placement. Sit<>stand x 4 with the first 3 (mod A +2) and last one (min A +2)    Balance Overall balance assessment: Needs assistance Sitting-balance support: No upper extremity supported;Feet supported Sitting balance-Leahy Scale: Fair     Standing  balance support: Bilateral upper extremity supported Standing balance-Leahy Scale: Poor Standing balance comment: reliant on Bil UE support and support of therapist in standing                           ADL either performed or assessed with clinical judgement   ADL Overall ADL's : Needs assistance/impaired Eating/Feeding: Set up Eating/Feeding Details (indicate cue type and reason): supported sitting and increased time Grooming: Minimal assistance Grooming Details (indicate cue type and reason): supported sitting and increased time Upper Body Bathing: Minimal assistance Upper Body Bathing Details (indicate cue type and reason): supported sitting and increased time Lower Body Bathing: Total assistance Lower Body Bathing Details (indicate cue type and reason): Mod A +2 sit<>stand Upper Body Dressing : Moderate assistance Upper Body Dressing Details (indicate cue type and reason): supported sitting and increased time Lower Body Dressing: Total assistance Lower Body Dressing Details (indicate cue type and reason): Mod A sit<>stand Toilet Transfer: Moderate assistance;+2 for safety/equipment;BSC;RW   Toileting- Clothing Manipulation and Hygiene: Total assistance Toileting - Clothing Manipulation Details (indicate cue type and reason): Mod A +2 sit<>stand             Vision Patient Visual Report: No change from baseline Additional Comments: Pt with tendency to keep eyes closed            Pertinent Vitals/Pain Pain Assessment: Faces Faces Pain Scale: Hurts little more Pain Location: abdominal incision site Pain Descriptors / Indicators: Sore Pain Intervention(s): Limited activity within patient's tolerance;Monitored during session;Repositioned;Patient requesting pain meds-RN notified;RN gave pain meds during session  Hand Dominance  right   Extremity/Trunk Assessment Upper Extremity Assessment Upper Extremity Assessment: Generalized weakness            Communication  no issues   Cognition Arousal/Alertness: Awake/alert Behavior During Therapy: Anxious Overall Cognitive Status: Within Functional Limits for tasks assessed                                                Home Living Family/patient expects to be discharged to:: Skilled nursing facility                                                 OT Problem List: Decreased strength;Impaired balance (sitting and/or standing);Pain;Obesity      OT Treatment/Interventions: Self-care/ADL training;Therapeutic activities;Patient/family education;DME and/or AE instruction;Balance training    OT Goals(Current goals can be found in the care plan section) Acute Rehab OT Goals Patient Stated Goal: to go back to bed (after he had just gotten up) OT Goal Formulation: With patient Time For Goal Achievement: 04/08/17 Potential to Achieve Goals: Good  OT Frequency: Min 2X/week   Barriers to D/C: Decreased caregiver support          Co-evaluation PT/OT/SLP Co-Evaluation/Treatment: Yes Reason for Co-Treatment: For patient/therapist safety;To address functional/ADL transfers   OT goals addressed during session: Strengthening/ROM;ADL's and self-care      AM-PAC PT "6 Clicks" Daily Activity     Outcome Measure Help from another person eating meals?: A Little Help from another person taking care of personal grooming?: A Little Help from another person toileting, which includes using toliet, bedpan, or urinal?: A Lot Help from another person bathing (including washing, rinsing, drying)?: A Lot Help from another person to put on and taking off regular upper body clothing?: A Little Help from another person to put on and taking off regular lower body clothing?: Total 6 Click Score: 14   End of Session Equipment Utilized During Treatment: Gait belt;Rolling walker Nurse Communication: Mobility status (2 loose stools,  chair alarm under him and attached to  monitor, but not plug into call bell system)  Activity Tolerance: Patient tolerated treatment well Patient left: in chair;with call bell/phone within reach;with chair alarm set  OT Visit Diagnosis: Unsteadiness on feet (R26.81);Muscle weakness (generalized) (M62.81);Pain Pain - part of body:  (abdomen)                Time: 1607-3710 OT Time Calculation (min): 51 min Charges:  OT General Charges $OT Visit: 1 Visit OT Evaluation $OT Eval Moderate Complexity: 1 Mod OT Treatments $Self Care/Home Management : 8-22 mins [place], OTR/L [phone] 04/01/2017

## 2017-04-01 NOTE — Progress Notes (Signed)
Subjective No acute events. Doing well. In good spirits. BMx2. Denies n/v.  Objective: Vital signs in last 24 hours: Temp:  [98 F (36.7 C)-98.4 F (36.9 C)] 98 F (36.7 C) (09/20 0500) Pulse Rate:  [70-85] 72 (09/20 0500) Resp:  [18-23] 18 (09/20 0500) BP: (146-165)/(60-71) 146/71 (09/20 0500) SpO2:  [91 %-96 %] 94 % (09/20 0500) Weight:  [104.9 kg (231 lb 3.2 oz)] 104.9 kg (231 lb 3.2 oz) (09/20 0500) Last BM Date: 03/26/17  Intake/Output from previous day: 09/19 0701 - 09/20 0700 In: 2241.7 [P.O.:120; I.V.:1921.7; IV Piggyback:200] Out: 2400 [Urine:2400] Intake/Output this shift: Total I/O In: 220 [P.O.:220] Out: 200 [Urine:200]  Gen: NAD, comfortable CV: RRR Pulm: Normal work of breathing Abd: Obese, soft, NT. Wound VAC in place Ext: SCDs in place  Lab Results: CBC   Recent Labs  03/31/17 0518 04/01/17 0425  WBC 19.4* 19.4*  HGB 10.7* 10.3*  HCT 32.5* 32.1*  PLT 287 283   BMET  Recent Labs  03/31/17 0518 04/01/17 0425  NA 148* 146*  K 4.2 3.9  CL 118* 115*  CO2 21* 22  GLUCOSE 170* 195*  BUN 103* 91*  CREATININE 3.48* 2.83*  CALCIUM 8.6* 8.4*   PT/INR No results for input(s): LABPROT, INR in the last 72 hours. ABG No results for input(s): PHART, HCO3 in the last 72 hours.  Invalid input(s): PCO2, PO2  Studies/Results:  Anti-infectives: Anti-infectives    Start     Dose/Rate Route Frequency Ordered Stop   04/01/17 0000  Ampicillin-Sulbactam (UNASYN) 3 g in sodium chloride 0.9 % 100 mL IVPB     3 g 200 mL/hr over 30 Minutes Intravenous Every 12 hours 03/31/17 1443     03/30/17 1200  meropenem (MERREM) 1 g in sodium chloride 0.9 % 100 mL IVPB  Status:  Discontinued     1 g 200 mL/hr over 30 Minutes Intravenous Every 12 hours 03/30/17 1127 03/31/17 1443   03/27/17 1230  meropenem (MERREM) 1 g in sodium chloride 0.9 % 100 mL IVPB  Status:  Discontinued     1 g 200 mL/hr over 30 Minutes Intravenous Every 24 hours 03/27/17 1153 03/30/17 1127    03/26/17 2200  piperacillin-tazobactam (ZOSYN) IVPB 2.25 g  Status:  Discontinued     2.25 g 100 mL/hr over 30 Minutes Intravenous Every 8 hours 03/26/17 1914 03/27/17 1153   03/26/17 1600  piperacillin-tazobactam (ZOSYN) IVPB 3.375 g     3.375 g 100 mL/hr over 30 Minutes Intravenous  Once 03/26/17 1555 03/26/17 1811       Assessment/Plan: Patient Active Problem List   Diagnosis Date Noted  . Appendicitis with peritonitis   . Oliguria   . Intra-abdominal abscess ([place])   . Ruptured appendicitis 03/26/2017  . Marital stress 02/18/2017  . Current use of long term anticoagulation - Plavix 06/10/2016  . Trigger finger 06/10/2016  . BPH associated with nocturia 04/27/2016  . Diabetic peripheral neuropathy ([place])   . COPD (chronic obstructive pulmonary disease) ([place])   . Bell's palsy   . Gout 06/26/2015  . AKI (acute kidney injury) ([place])   . Chronic systolic CHF (congestive heart failure) ([place]) 04/08/2015  . Diverticulitis 04/08/2015  . DOE (dyspnea on exertion), due to significant CAD 03/28/2015  . Chronic kidney disease (CKD), stage IV (severe) ([place]) 03/04/2015  . Coronary artery disease involving native coronary artery of native heart with angina pectoris ([place]) 09/13/2014  . GERD (gastroesophageal reflux disease) 08/07/2012  . Depression 08/05/2012  . Sleep apnea  08/05/2012  . Uncontrolled type 2 diabetes mellitus with stage 3 chronic kidney disease, with long-term current use of insulin ([place]) 08/05/2012  . HTN (hypertension) 08/05/2012  . Ischemic cardiomyopathy 11/03/2011  . Atrioventricular block, complete ([place])   . Obesity   . Pacemaker    Assessment/Plan AICD ICM HTN CAD s/p multi stents (E 35-40%) CHF HLD IDDM Acute on chronic kidney injury - Cr trending down 4.09. Lasix per nephrology Depression  S/p EXPLORATORY LAPAROTOMY, Ileocecectomy with ileocolic anastomosis 3/88 Dr. Dema Severin - POD #5 - WBC stable at 19.4 today, afebrile - +BMs - pain: scheduled  tylenol, IV robaxin PRN, Fentanyl PCA - OOB/mobilize!  PT/OT - Incentive spirometry q 1h!   ID - merrem 9/15>>day#6 for peritonitis, zosyn 9/14>>9/15 FEN - IVF, CLD VTE - SCDs, heparin Foley - in place for strict I&O's Follow up - Dr. Dema Severin  Plan - expected post-op ileus, appears to be resolving -CLD today -Encourage OOB and mobilization - PT/OT -Daily labs   LOS: 6 days   Ileana Roup, [facility]

## 2017-04-02 LAB — CBC
HEMATOCRIT: 34 % — AB (ref 39.0–52.0)
HEMOGLOBIN: 11.1 g/dL — AB (ref 13.0–17.0)
MCH: 29.8 pg (ref 26.0–34.0)
MCHC: 32.6 g/dL (ref 30.0–36.0)
MCV: 91.2 fL (ref 78.0–100.0)
Platelets: 321 10*3/uL (ref 150–400)
RBC: 3.73 MIL/uL — ABNORMAL LOW (ref 4.22–5.81)
RDW: 14.8 % (ref 11.5–15.5)
WBC: 17.9 10*3/uL — AB (ref 4.0–10.5)

## 2017-04-02 LAB — GLUCOSE, CAPILLARY
GLUCOSE-CAPILLARY: 187 mg/dL — AB (ref 65–99)
GLUCOSE-CAPILLARY: 216 mg/dL — AB (ref 65–99)
GLUCOSE-CAPILLARY: 160 mg/dL — AB (ref 65–99)
Glucose-Capillary: 177 mg/dL — ABNORMAL HIGH (ref 65–99)

## 2017-04-02 LAB — BASIC METABOLIC PANEL
ANION GAP: 8 (ref 5–15)
BUN: 79 mg/dL — ABNORMAL HIGH (ref 6–20)
CALCIUM: 8.4 mg/dL — AB (ref 8.9–10.3)
CO2: 21 mmol/L — ABNORMAL LOW (ref 22–32)
Chloride: 113 mmol/L — ABNORMAL HIGH (ref 101–111)
Creatinine, Ser: 2.74 mg/dL — ABNORMAL HIGH (ref 0.61–1.24)
GFR calc Af Amer: 23 mL/min — ABNORMAL LOW (ref >60)
GFR, EST NON AFRICAN AMERICAN: 20 mL/min — AB (ref >60)
Glucose, Bld: 186 mg/dL — ABNORMAL HIGH (ref 65–99)
POTASSIUM: 3.6 mmol/L (ref 3.5–5.1)
SODIUM: 142 mmol/L (ref 135–145)

## 2017-04-02 LAB — AEROBIC/ANAEROBIC CULTURE (SURGICAL/DEEP WOUND)

## 2017-04-02 LAB — AEROBIC/ANAEROBIC CULTURE W GRAM STAIN (SURGICAL/DEEP WOUND)

## 2017-04-02 MED ORDER — LISINOPRIL 5 MG PO TABS
2.5000 mg | ORAL_TABLET | Freq: Every day | ORAL | Status: DC
  Administered 2017-04-02: 2.5 mg via ORAL
  Administered 2017-04-03: 10:00:00 via ORAL
  Administered 2017-04-04 – 2017-04-06 (×3): 2.5 mg via ORAL
  Filled 2017-04-02 (×5): qty 1

## 2017-04-02 MED ORDER — OXYCODONE HCL 5 MG PO TABS
5.0000 mg | ORAL_TABLET | ORAL | Status: DC | PRN
  Administered 2017-04-05 (×2): 5 mg via ORAL
  Filled 2017-04-02 (×4): qty 1

## 2017-04-02 MED ORDER — FENTANYL CITRATE (PF) 100 MCG/2ML IJ SOLN
12.5000 ug | INTRAMUSCULAR | Status: DC | PRN
  Administered 2017-04-02: 12.5 ug via INTRAVENOUS
  Filled 2017-04-02: qty 2

## 2017-04-02 NOTE — Progress Notes (Signed)
Family Medicine Teaching Service Daily Progress Note Intern Pager: [phone]  Patient name: Rodney Farley Medical record number: [id number] Date of birth: 12-12-1931 Age: 83 y.o. Gender: male  Primary Care Provider: [name], MD Consultants: Nephrology Code Status: FULL  Pt Overview and Major Events to Date:  9/15  s/p ex lap with appendectomy and wash out 9/16  PCA initiated for pain control  9/17  Hemodynamically stable, transferred to med-surg  Assessment and Plan:  Ruptured appendicitis s/p ileocecectomy: stable.  - Appreciate surgery recs  - Day 7 of antibiotic treatment, on Unasyn (9/19-) due to E. Coli growth from culture, will continue for 10 day treatment with last dose on 09/23 - stopped Meropenem (9/15-19), received Zosyn (9/14-9/15) - Diet advanced to full liquid diet today - Incentive spirometry q 1h - Protonix 40mg  po - Continue scheduled Tylenol and discontinued fentanyl for pain, started Oxy IR 5 mg q4 prn. - PT/OT recommending SNF - WBC at 18.3> 17.9 on 09/21  AKI on CAD stage IV: Resolved. baseline of 2.74 - Creatinine 2.74 on 09/21 - Starting on Lisinopril 2.5 mg while inpatient to monitor if patient will benefit from kidney protective aspects of an Ace-I- will monitor BMP - Holding Lasix   HFpEF: Does not appear fluid overloaded on exam has total output since admission of .8 L. Weight on admission was 221 lb > 210> 226 on 09/21. Could be due to inaccurate weights. Given total output decreased since admission.  - Continue to hold Lasix for now - Can stop his maintenance fluids of D5W @ 75 mL/hr due to CLD with good intake. 0.9L of intake yesterday - On Coreg 6.25 BID  HTN: Blood pressure has remained minimally elevated for past 24-48 hours with last BP of 158/70 - On coreg 6.25 BID - Starting on Lisinopril 2.5 mg while inpatient to monitor if patient will benefit from kidney protective aspects of an Ace-I, as well as try to wean from the  home hydralazine   T2DM: Blood glucose has been well controlled without Lantus. - SSI- received 7 U total yesterday - Continue to hold Lantus  - Monitor with diet advanced to full liquids  MDD: Patient's wife recently left him 1 month ago. Being treated with Zoloft. Likely switch to Wellbutrin at discharge - Continue Zoloft home dose for now  H/o complete AV block s/p pacemaker: Stable.  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 - CPAP at night- pt refused and reports hie does not wear his at home  BPH: Stable. - Continue home flomax  HLD: Stable.  - Continue home Atorvastatin 40mg   GERD: Stable. - Continue home Protonix- changed to PO  FEN/GI: Clear liquid diet PPx: Heparin  Disposition: inpatient, to SNF when medically stable  Subjective:  Patient is feeling off today. He says his pain is better but he just doesn't feel quite right. He is very happy with his advanced diet. Reports 9 bm's yesterday. He says he wishes he could just be well much faster.   Objective: Temp:  [98 F (36.7 C)-98.6 F (37 C)] 98.6 F (37 C) (09/21 0500) Pulse Rate:  [71-76] 76 (09/21 0500) Resp:  [18-20] 20 (09/20 2230) BP: (145-158)/(70-75) 158/70 (09/21 0500) SpO2:  [95 %-96 %] 96 % (09/21 0500) Weight:  [226 lb (102.5 kg)] 226 lb (102.5 kg) (09/21 0500) Physical Exam: General:  NAD, pleasant Cardiovascular: RRR, normal S1, S2, no murmurs Respiratory: CTAB, no wheezes in frontal lung fields Abdomen: soft, moderate distention, RUQ nontender and RLQ less TTP, midline abdominal surgical wound with wound vac Extremities: no cyanosis or edema Neuro: A&O x3, appropriate affect  Laboratory:  Recent Labs Lab 03/31/17 0518 04/01/17 0425 04/02/17 0643  WBC 19.4* 19.4* 17.9*  HGB 10.7* 10.3* 11.1*  HCT 32.5* 32.1* 34.0*  PLT 287 283 321    Recent  Labs Lab 03/26/17 1512  03/27/17 2120  03/29/17 0239  03/31/17 0518 04/01/17 0425 04/02/17 0643  NA 135  < > 143  < > 143  < > 148* 146* 142  K 4.8  < > 4.2  < > 4.9  < > 4.2 3.9 3.6  CL 100*  < > 119*  < > 116*  < > 118* 115* 113*  CO2 22  < > 15*  < > 16*  < > 21* 22 21*  BUN 79*  < > 82*  < > 114*  < > 103* 91* 79*  CREATININE 3.77*  < > 4.49*  < > 5.17*  < > 3.48* 2.83* 2.74*  CALCIUM 9.2  < > 6.2*  < > 8.5*  < > 8.6* 8.4* 8.4*  PROT 7.2  --  4.4*  --  5.5*  --   --   --   --   BILITOT 0.7  --  0.7  --  0.5  --   --   --   --   ALKPHOS 114  --  52  --  78  --   --   --   --   ALT 34  --  33  --  34  --   --   --   --   AST 22  --  59*  --  57*  --   --   --   --   GLUCOSE 143*  < > 89  < > 122*  < > 170* 195* 186*  < > = values in this interval not displayed.  Imaging/Diagnostic Tests: CT ABD/Pelvis 9/14 >> 8.3 x 5.6 cm rounded complex abnormality seen adjacent to inferior portion of cecum with mild surrounding inflammation, enlarged and inflamed appendix, significant wall thickening of the terminal ileum, multiple exophytic cysts in both kidneys Renal US 9/14 >> no evidence of hydronephrosis, bilateral renal cysts  9/15 - OR Findings: Purulent fluid upon peritoneal entry - cultures sent. Fat containing ventral hernia ~4x4cm which was included in midline incision but ultimately closed primarily as part of the fascial closure. Appendix and cecum completely stuck to retroperitoneum in the right lower quadrant. Lateral wall of cecum and base of appendix appeared diseased and necrotic which necessitated an ileocecectomy. Ascending colon and distal ileum were normal appearing and healthy.  Fronnie Urton, [name], DO 04/02/2017, 8:26 AM PGY-1, [place] Intern pager: [phone], text pages welcome

## 2017-04-02 NOTE — Progress Notes (Signed)
Inpatient Diabetes Program Recommendations  AACE/ADA: New Consensus Statement on Inpatient Glycemic Control (2015)  Target Ranges:  Prepandial:   less than 140 mg/dL      Peak postprandial:   less than 180 mg/dL (1-2 hours)      Critically ill patients:  140 - 180 mg/dL   Lab Results  Component Value Date   GLUCAP 187 (H) 04/02/2017   HGBA1C 8.2 (H) 01/28/2017    Review of Glycemic ControlResults for ARNALDO, HEFFRON (MRN [id number]) as of 04/02/2017 09:16  Ref. Range 04/01/2017 07:53 04/01/2017 12:21 04/01/2017 16:05 04/01/2017 22:17 04/02/2017 07:43  Glucose-Capillary Latest Ref Range: 65 - 99 mg/dL 182 (H) 190 (H) 229 (H) 166 (H) 187 (H)   Diabetes history: Type 2 diabetes Outpatient Diabetes medications: Lantus 60 units q HS  Current orders for Inpatient glycemic control:  Novolog sensitive tid with meals  Inpatient Diabetes Program Recommendations:   May consider adding Lantus 12 units daily to meet basal insulin needs.  Note that this is considerably less than home dose therefore will likely need titration based on fasting CBG's.   Thanks,   Adah Perl, RN, BC-ADM Inpatient Diabetes Coordinator Pager [phone] (8a-5p)

## 2017-04-02 NOTE — Clinical Social Work Note (Signed)
CSW spoke with pt's son via telephone. Pt's son has the list of bed offers and will be visiting them over the weekend and will call weekend CSW with choice. Per PA pt will not d/c before Monday. CSW continuing to work on SNF placement and is awaiting facility choice and d/c date before starting insurance auth.   [place], [place]

## 2017-04-02 NOTE — Progress Notes (Signed)
[facility] Progress Note  6 Days Post-Op  Subjective: CC:  Currently undergoing VAC change, son at bedside. Reports some abdominal discomfort last night, now resolved. Tolerating full liquids and having bowel movements. Having flatus but not much. Participating with therapies and up to chair more. Overall feels better.  Objective: Vital signs in last 24 hours: Temp:  [98 F (36.7 C)-98.6 F (37 C)] 98.6 F (37 C) (09/21 0500) Pulse Rate:  [71-76] 76 (09/21 0500) Resp:  [18-20] 20 (09/20 2230) BP: (145-158)/(70-75) 158/70 (09/21 0500) SpO2:  [95 %-96 %] 96 % (09/21 0500) Weight:  [102.5 kg (226 lb)] 102.5 kg (226 lb) (09/21 0500) Last BM Date: 04/02/17  Intake/Output from previous day: 09/20 0701 - 09/21 0700 In: 2876.3 [P.O.:895; I.V.:1881.3; IV Piggyback:100] Out: 2000 [Urine:2000] Intake/Output this shift: No intake/output data recorded.  PE: Gen:  Alert, NAD, pleasant Card:  Regular rate and rhythm Pulm:  Normal effort Abd: Soft, appropriate incisional tenderness, non-distended, incision healing appropriately with no appreciable purulence or tunneling  Skin: warm and dry, no rashes  Psych: A&Ox3   Lab Results:   Recent Labs  04/01/17 0425 04/02/17 0643  WBC 19.4* 17.9*  HGB 10.3* 11.1*  HCT 32.1* 34.0*  PLT 283 321   BMET  Recent Labs  04/01/17 0425 04/02/17 0643  NA 146* 142  K 3.9 3.6  CL 115* 113*  CO2 22 21*  GLUCOSE 195* 186*  BUN 91* 79*  CREATININE 2.83* 2.74*  CALCIUM 8.4* 8.4*   PT/INR No results for input(s): LABPROT, INR in the last 72 hours. CMP     Component Value Date/Time   NA 142 04/02/2017 0643   NA 139 09/18/2015 1546   K 3.6 04/02/2017 0643   CL 113 (H) 04/02/2017 0643   CO2 21 (L) 04/02/2017 0643   GLUCOSE 186 (H) 04/02/2017 0643   BUN 79 (H) 04/02/2017 0643   BUN 45 (H) 09/18/2015 1546   CREATININE 2.74 (H) 04/02/2017 0643   CREATININE 1.94 (H) 10/04/2015 1159   CALCIUM 8.4 (L) 04/02/2017 0643   PROT  5.5 (L) 03/29/2017 0239   ALBUMIN 2.3 (L) 03/29/2017 0239   AST 57 (H) 03/29/2017 0239   ALT 34 03/29/2017 0239   ALKPHOS 78 03/29/2017 0239   BILITOT 0.5 03/29/2017 0239   GFRNONAA 20 (L) 04/02/2017 0643   GFRAA 23 (L) 04/02/2017 0643   Lipase     Component Value Date/Time   LIPASE 32 03/26/2017 1512       Studies/Results: No results found.  Anti-infectives: Anti-infectives    Start     Dose/Rate Route Frequency Ordered Stop   04/01/17 0000  Ampicillin-Sulbactam (UNASYN) 3 g in sodium chloride 0.9 % 100 mL IVPB     3 g 200 mL/hr over 30 Minutes Intravenous Every 12 hours 03/31/17 1443     03/30/17 1200  meropenem (MERREM) 1 g in sodium chloride 0.9 % 100 mL IVPB  Status:  Discontinued     1 g 200 mL/hr over 30 Minutes Intravenous Every 12 hours 03/30/17 1127 03/31/17 1443   03/27/17 1230  meropenem (MERREM) 1 g in sodium chloride 0.9 % 100 mL IVPB  Status:  Discontinued     1 g 200 mL/hr over 30 Minutes Intravenous Every 24 hours 03/27/17 1153 03/30/17 1127   03/26/17 2200  piperacillin-tazobactam (ZOSYN) IVPB 2.25 g  Status:  Discontinued     2.25 g 100 mL/hr over 30 Minutes Intravenous Every 8 hours 03/26/17 1914 03/27/17 1153  03/26/17 1600  piperacillin-tazobactam (ZOSYN) IVPB 3.375 g     3.375 g 100 mL/hr over 30 Minutes Intravenous  Once 03/26/17 1555 03/26/17 1811     Assessment/Plan AICD ICM HTN CAD s/p multi stents (E 35-40%) CHF HLD IDDM Acute on chronic kidney injury - Cr trending down 4.09. Lasix per nephrology Depression  S/p EXPLORATORY LAPAROTOMY, Ileocecectomy with ileocolic anastomosis 3/83 Dr. Dema Severin - POD #6 - WBC decreased to 17 from19.4 today, afebrile - +BMs - pain: scheduled tylenol, PRN oxycodone - OOB/mobilize!  PT/OT - Incentive spirometry q 1h!   ID - merrem 9/15>>day#7 for peritonitis, zosyn 9/14>>9/15, unasyn 9/20 >>  FEN - IVF, CLD VTE - SCDs, heparin Foley - placed for strict I&O's Follow up - Dr. Dema Severin  Plan - ileus  continues to resolve, advance to SOFT diet -Encourage OOB and mobilization - PT/OT -Daily labs - OK to d/c foley from surgical standpoint.     LOS: 7 days    Jill Alexanders , [facility] 04/02/2017, 10:53 AM Pager: [phone] Consults: [phone] Mon-Fri 7:00 am-4:30 pm Sat-Sun 7:00 am-11:30 am

## 2017-04-02 NOTE — Progress Notes (Signed)
Patient refused CPAP for the night  

## 2017-04-03 DIAGNOSIS — K353 Acute appendicitis with localized peritonitis: Principal | ICD-10-CM

## 2017-04-03 LAB — CBC
HEMATOCRIT: 32 % — AB (ref 39.0–52.0)
HEMOGLOBIN: 10.4 g/dL — AB (ref 13.0–17.0)
MCH: 29.4 pg (ref 26.0–34.0)
MCHC: 32.5 g/dL (ref 30.0–36.0)
MCV: 90.4 fL (ref 78.0–100.0)
PLATELETS: 320 10*3/uL (ref 150–400)
RBC: 3.54 MIL/uL — ABNORMAL LOW (ref 4.22–5.81)
RDW: 14.5 % (ref 11.5–15.5)
WBC: 17 10*3/uL — AB (ref 4.0–10.5)

## 2017-04-03 LAB — BASIC METABOLIC PANEL
ANION GAP: 6 (ref 5–15)
BUN: 79 mg/dL — ABNORMAL HIGH (ref 6–20)
CHLORIDE: 112 mmol/L — AB (ref 101–111)
CO2: 22 mmol/L (ref 22–32)
Calcium: 8.3 mg/dL — ABNORMAL LOW (ref 8.9–10.3)
Creatinine, Ser: 2.82 mg/dL — ABNORMAL HIGH (ref 0.61–1.24)
GFR calc Af Amer: 22 mL/min — ABNORMAL LOW (ref >60)
GFR, EST NON AFRICAN AMERICAN: 19 mL/min — AB (ref >60)
GLUCOSE: 143 mg/dL — AB (ref 65–99)
Potassium: 3.5 mmol/L (ref 3.5–5.1)
SODIUM: 140 mmol/L (ref 135–145)

## 2017-04-03 LAB — GLUCOSE, CAPILLARY
GLUCOSE-CAPILLARY: 162 mg/dL — AB (ref 65–99)
GLUCOSE-CAPILLARY: 177 mg/dL — AB (ref 65–99)
Glucose-Capillary: 138 mg/dL — ABNORMAL HIGH (ref 65–99)
Glucose-Capillary: 175 mg/dL — ABNORMAL HIGH (ref 65–99)

## 2017-04-03 MED ORDER — POLYETHYLENE GLYCOL 3350 17 G PO PACK
17.0000 g | PACK | Freq: Every day | ORAL | Status: DC
  Administered 2017-04-03 – 2017-04-05 (×3): 17 g via ORAL
  Filled 2017-04-03 (×4): qty 1

## 2017-04-03 MED ORDER — HYDRALAZINE HCL 25 MG PO TABS: 37.5000 mg | ORAL_TABLET | Freq: Two times a day (BID) | ORAL | Status: DC

## 2017-04-03 NOTE — Progress Notes (Signed)
Pt. Refused cpap. 

## 2017-04-03 NOTE — Progress Notes (Signed)
General Surgery [facility]  Assessment & Plan: POD#7 - S/p EXPLORATORY LAPAROTOMY, Ileocecectomy with ileocolic anastomosis 2/95 Dr. Dema Severin  - WBC decreased to 17,  afebrile  - pain: scheduled tylenol, PRN oxycodone  - OOB/mobilize! PT/OT consult  - Incentive spirometry q 1h!   Plan  advanced to SOFT diet  Encourage OOB and mobilization - PT/OT  Patient requesting rehab consultation / evaluation - will ask Case manager and SW to be involved        Earnstine Regal, MD, [facility]       Office: [phone]    Chief Complaint: Complicated appendicitis  Subjective: Patient in bed, comfortable.  Some pain with BM's.  Tolerating diet.  Objective: Vital signs in last 24 hours: Temp:  [97.6 F (36.4 C)-98.8 F (37.1 C)] 97.7 F (36.5 C) (09/22 0450) Pulse Rate:  [68-72] 68 (09/22 0450) Resp:  [18] 18 (09/22 0450) BP: (123-173)/(54-64) 173/63 (09/22 0450) SpO2:  [96 %-98 %] 98 % (09/22 0450) Weight:  [101.1 kg (222 lb 12.8 oz)] 101.1 kg (222 lb 12.8 oz) (09/22 0500) Last BM Date: 04/03/17  Intake/Output from previous day: 09/21 0701 - 09/22 0700 In: 2042 [P.O.:1742; IV Piggyback:300] Out: 1410 [Urine:1350; Emesis/NG output:60] Intake/Output this shift: Total I/O In: 222 [P.O.:222] Out: -   Physical Exam: HEENT - sclerae clear, mucous membranes moist Neck - soft Abdomen - soft, obese; VAC dressing intact in midline wound; BS present Ext - no edema, non-tender Neuro - alert & oriented, no focal deficits  Lab Results:   Recent Labs  04/02/17 0643 04/03/17 0742  WBC 17.9* 17.0*  HGB 11.1* 10.4*  HCT 34.0* 32.0*  PLT 321 320   BMET  Recent Labs  04/02/17 0643 04/03/17 0742  NA 142 140  K 3.6 3.5  CL 113* 112*  CO2 21* 22  GLUCOSE 186* 143*  BUN 79* 79*  CREATININE 2.74* 2.82*  CALCIUM 8.4* 8.3*   PT/INR No results for input(s): LABPROT, INR in the last 72 hours. Comprehensive Metabolic  Panel:    Component Value Date/Time   NA 140 04/03/2017 0742   NA 142 04/02/2017 0643   NA 139 09/18/2015 1546   K 3.5 04/03/2017 0742   K 3.6 04/02/2017 0643   CL 112 (H) 04/03/2017 0742   CL 113 (H) 04/02/2017 0643   CO2 22 04/03/2017 0742   CO2 21 (L) 04/02/2017 0643   BUN 79 (H) 04/03/2017 0742   BUN 79 (H) 04/02/2017 0643   BUN 45 (H) 09/18/2015 1546   CREATININE 2.82 (H) 04/03/2017 0742   CREATININE 2.74 (H) 04/02/2017 0643   CREATININE 1.94 (H) 10/04/2015 1159   CREATININE 2.46 (H) 09/27/2015 1057   GLUCOSE 143 (H) 04/03/2017 0742   GLUCOSE 186 (H) 04/02/2017 0643   CALCIUM 8.3 (L) 04/03/2017 0742   CALCIUM 8.4 (L) 04/02/2017 0643   AST 57 (H) 03/29/2017 0239   AST 59 (H) 03/27/2017 2120   ALT 34 03/29/2017 0239   ALT 33 03/27/2017 2120   ALKPHOS 78 03/29/2017 0239   ALKPHOS 52 03/27/2017 2120   BILITOT 0.5 03/29/2017 0239   BILITOT 0.7 03/27/2017 2120   PROT 5.5 (L) 03/29/2017 0239   PROT 4.4 (L) 03/27/2017 2120   ALBUMIN 2.3 (L) 03/29/2017 0239   ALBUMIN 2.0 (L) 03/27/2017 2120    Studies/Results: No results found.    Rodney Farley M 04/03/2017  Patient ID: Rodney Farley, male  DOB: 1931-11-23, 83 y.o.   MRN: [id number]

## 2017-04-03 NOTE — Progress Notes (Signed)
Family Medicine Teaching Service Daily Progress Note Intern Pager: [phone]  Patient name: Rodney Farley Medical record number: [id number] Date of birth: 06-Jul-1932 Age: 83 y.o. Gender: male  Primary Care Provider: [name], MD Consultants: Nephrology Code Status: FULL  Pt Overview and Major Events to Date:  9/15  s/p ex lap with appendectomy and wash out 9/16  PCA initiated for pain control  9/17  Hemodynamically stable, transferred to med-surg  Assessment and Plan:  Ruptured appendicitis s/p ileocecectomy: POD #7. Pain improving per patient. WBCs slowly trending down.  - Surgery following, appreciate recs - Continue Unasyn x 10 days, end date 9/23 - Advanced to soft diet - Incentive spirometry q 1h - Protonix 40mg  po - Continue scheduled Tylenol and Oxy IR 5 mg q4 prn. - PT/OT recommending SNF  AKI on CKD stage IV: Resolved. Cr bumped slightly from 2.74 yesterday to 2.82 today. - Lisinopril 2.5mg  started 9/21, will need to monitor Cr closely - Trend BMPs - Holding Lasix  HFpEF: Does not appear fluid overloaded this morning. Weight 222lb this morning, from 226lb yesterday (weights have ranged from 220-240 at recent outpatient visits). UOP 1.4L in the last 24 hours. - Continue to hold Lasix for now, can restart as needed - On Coreg 6.25 BID  HTN: BP elevated this morning to 173/63, have mostly been in the 671I-458K systolic. - Continue Coreg 6.25 BID - Lisinopril 2.5 mg daily started 9/21 - Will monitor BPs closely, may need to restart home Hydralazine 25mg  1.5 tablets bid  T2DM: CBGs ranging from 138-216 in the last 24 hours. Has received 8 units per sliding scale. On Lantus 60 units at home. - Continue to hold Lantus, add back as needed - Continue sensitive SSI  MDD: Patient's wife recently left him 1 month ago. Being treated with Zoloft. Likely switch to Wellbutrin at discharge - Continue Zoloft home dose for now  H/o complete AV block s/p  pacemaker: Stable.  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 - Refusing CPAP at night.  BPH: Stable. - Continue home flomax  HLD: Stable.  - Continue home Lipitor 40mg   GERD: Stable. - Continue home Protonix  FEN/GI: Soft diet, add Miralax daily given straining with BMs. PPx: Heparin  Disposition: Pending improvement in pain, possibly in the next 1-2 days.  Subjective:  Patient states he is having pain with bowel movements. Having to strain. Did have one BM this morning and feels like he has to have another one. States that his abdominal pain is improving.  Objective: Temp:  [97.6 F (36.4 C)-98.8 F (37.1 C)] 97.7 F (36.5 C) (09/22 0450) Pulse Rate:  [68-72] 68 (09/22 0450) Resp:  [18] 18 (09/22 0450) BP: (123-173)/(54-64) 173/63 (09/22 0450) SpO2:  [96 %-98 %] 98 % (09/22 0450) Weight:  [222 lb 12.8 oz (101.1 kg)] 222 lb 12.8 oz (101.1 kg) (09/22 0500) Physical Exam: General: NAD, pleasant Cardiovascular: RRR, normal S1, S2, no murmurs Respiratory: CTAB, no wheezes in frontal lung fields Abdomen: soft, moderate distention, no tenderness to palpation, midline surgical wound with wound vac in place Extremities: no lower extremity edema Neuro: A&O x3, appropriate affect  Laboratory:  Recent Labs Lab 03/31/17 0518 04/01/17 0425 04/02/17 0643  WBC 19.4* 19.4* 17.9*  HGB 10.7* 10.3* 11.1*  HCT 32.5* 32.1* 34.0*  PLT 287 283 321    Recent Labs Lab 03/27/17 2120  03/29/17 0239  03/31/17 0518 04/01/17 0425 04/02/17 0643  NA 143  < > 143  < > 148* 146* 142  K 4.2  < > 4.9  < > 4.2 3.9 3.6  CL 119*  < > 116*  < > 118* 115* 113*  CO2 15*  < > 16*  < > 21* 22 21*  BUN 82*  < > 114*  < > 103* 91* 79*  CREATININE 4.49*  < > 5.17*  < > 3.48* 2.83* 2.74*  CALCIUM 6.2*  < > 8.5*  < > 8.6* 8.4* 8.4*  PROT 4.4*  --  5.5*  --   --   --   --   BILITOT 0.7  --   0.5  --   --   --   --   ALKPHOS 52  --  78  --   --   --   --   ALT 33  --  34  --   --   --   --   AST 59*  --  57*  --   --   --   --   GLUCOSE 89  < > 122*  < > 170* 195* 186*  < > = values in this interval not displayed.  Imaging/Diagnostic Tests: CT ABD/Pelvis 9/14 >> 8.3 x 5.6 cm rounded complex abnormality seen adjacent to inferior portion of cecum with mild surrounding inflammation, enlarged and inflamed appendix, significant wall thickening of the terminal ileum, multiple exophytic cysts in both kidneys Renal US 9/14 >> no evidence of hydronephrosis, bilateral renal cysts  9/15 - OR Findings: Purulent fluid upon peritoneal entry - cultures sent. Fat containing ventral hernia ~4x4cm which was included in midline incision but ultimately closed primarily as part of the fascial closure. Appendix and cecum completely stuck to retroperitoneum in the right lower quadrant. Lateral wall of cecum and base of appendix appeared diseased and necrotic which necessitated an ileocecectomy. Ascending colon and distal ileum were normal appearing and healthy.  Cutberto Winfree, Pete Pelt, MD 04/03/2017, 8:19 AM PGY-3, [place] Intern pager: [phone], text pages welcome

## 2017-04-04 LAB — CBC
HCT: 30 % — ABNORMAL LOW (ref 39.0–52.0)
HEMOGLOBIN: 9.7 g/dL — AB (ref 13.0–17.0)
MCH: 29.2 pg (ref 26.0–34.0)
MCHC: 32.3 g/dL (ref 30.0–36.0)
MCV: 90.4 fL (ref 78.0–100.0)
PLATELETS: 331 10*3/uL (ref 150–400)
RBC: 3.32 MIL/uL — AB (ref 4.22–5.81)
RDW: 14.5 % (ref 11.5–15.5)
WBC: 17.4 10*3/uL — AB (ref 4.0–10.5)

## 2017-04-04 LAB — BASIC METABOLIC PANEL
ANION GAP: 9 (ref 5–15)
BUN: 76 mg/dL — ABNORMAL HIGH (ref 6–20)
CALCIUM: 8.1 mg/dL — AB (ref 8.9–10.3)
CO2: 19 mmol/L — AB (ref 22–32)
CREATININE: 2.9 mg/dL — AB (ref 0.61–1.24)
Chloride: 113 mmol/L — ABNORMAL HIGH (ref 101–111)
GFR calc non Af Amer: 19 mL/min — ABNORMAL LOW (ref >60)
GFR, EST AFRICAN AMERICAN: 22 mL/min — AB (ref >60)
Glucose, Bld: 184 mg/dL — ABNORMAL HIGH (ref 65–99)
Potassium: 3.4 mmol/L — ABNORMAL LOW (ref 3.5–5.1)
SODIUM: 141 mmol/L (ref 135–145)

## 2017-04-04 LAB — GLUCOSE, CAPILLARY
GLUCOSE-CAPILLARY: 166 mg/dL — AB (ref 65–99)
GLUCOSE-CAPILLARY: 159 mg/dL — AB (ref 65–99)
GLUCOSE-CAPILLARY: 159 mg/dL — AB (ref 65–99)
GLUCOSE-CAPILLARY: 153 mg/dL — AB (ref 65–99)
GLUCOSE-CAPILLARY: 141 mg/dL — AB (ref 65–99)

## 2017-04-04 MED ORDER — POTASSIUM CHLORIDE CRYS ER 10 MEQ PO TBCR
30.0000 meq | EXTENDED_RELEASE_TABLET | Freq: Once | ORAL | Status: AC
  Administered 2017-04-04: 30 meq via ORAL
  Filled 2017-04-04: qty 1

## 2017-04-04 MED ORDER — SODIUM CHLORIDE 0.9 % IV SOLN
3.0000 g | Freq: Two times a day (BID) | INTRAVENOUS | Status: AC
  Administered 2017-04-04: 3 g via INTRAVENOUS
  Filled 2017-04-04: qty 3

## 2017-04-04 NOTE — Progress Notes (Signed)
Family Medicine Teaching Service Daily Progress Note Intern Pager: [phone]  Patient name: Rodney Farley Medical record number: [id number] Date of birth: Feb 24, 1932 Age: 83 y.o. Gender: male  Primary Care Provider: [name], MD Consultants: Nephrology Code Status: FULL  Pt Overview and Major Events to Date:  9/15  s/p ex lap with appendectomy and wash out 9/16  PCA initiated for pain control  9/17  Hemodynamically stable, transferred to med-surg  Assessment and Plan:  Ruptured appendicitis s/p ileocecectomy: POD #7. Pain improving per patient. WBCs slowly trending down.  - Surgery following, appreciate recs - Continue Unasyn x 10 days, end date 9/23- last dose today - Advanced to soft diet - Incentive spirometry q 1h - Protonix 40mg  po - Continue scheduled Tylenol and Oxy IR 5 mg q4 prn. - PT/OT recommending SNF - Will attempt voiding trial today - WBC 17.4 on 09/23  AKI on CKD stage IV: Resolved. Cr bumped slightly from 2.74 yesterday to 2.90 today. - Lisinopril 2.5mg  started 9/21, will need to monitor Cr closely - Trend BMPs - Holding Lasix  HFpEF: Does not appear fluid overloaded this morning. Weight 222lb this morning, from 226lb yesterday (weights have ranged from 220-240 at recent outpatient visits). UOP 1.4L in the last 24 hours. - Continue to hold Lasix for now, can restart as needed - On Coreg 6.25 BID  HTN: BP mildly elevated this morning to 147/51. - Continue Coreg 6.25 BID - Lisinopril 2.5 mg daily started 9/21 - Will monitor BPs closely, may need to restart home dosage of coreg 12.5 BID if heart rate ok  T2DM: CBGs ranging from 143-184 in the last 24 hours. Has received 5 units per sliding scale. On Lantus 60 units at home. - Continue to hold Lantus, add back as needed - Continue sensitive SSI  MDD: Patient's wife recently left him 1 month ago. Being treated with Zoloft. Likely switch to Wellbutrin at discharge - Continue Zoloft home dose  for now  H/o complete AV block s/p pacemaker: Stable.  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 - Refusing CPAP at night.  BPH: Stable. - Continue home flomax  HLD: Stable.  - Continue home Lipitor 40mg   GERD: Stable. - Continue home Protonix  FEN/GI: Soft diet, Miralax daily given straining with BMs. PPx: Heparin  Disposition: Pending improvement in pain, SNF  Subjective:  Patient states he is having pain with bowel movements. Worried about the dressing change. Did have one BM this morning and feels like he has to have another one. States that his abdominal pain is improving.  Objective: Temp:  [97.5 F (36.4 C)-98.2 F (36.8 C)] 98 F (36.7 C) (09/23 0458) Pulse Rate:  [69-72] 69 (09/23 0458) Resp:  [17] 17 (09/23 0458) BP: (130-174)/(51-72) 147/51 (09/23 0458) SpO2:  [95 %-97 %] 96 % (09/23 0458) Physical Exam: General: NAD, pleasant Cardiovascular: RRR, normal S1, S2, no murmurs Respiratory: CTAB, no wheezes in frontal lung fields Abdomen: soft, moderate distention, no tenderness to palpation, midline surgical wound with wound vac in place Extremities: trace lower extremity edema Neuro: A&O x3, appropriate affect  Laboratory:  Recent Labs Lab 04/02/17 0643 04/03/17 0742 04/04/17 0458  WBC 17.9* 17.0* 17.4*  HGB 11.1* 10.4* 9.7*  HCT 34.0* 32.0* 30.0*  PLT 321 320 331    Recent Labs Lab 03/29/17 0239  04/02/17 0643 04/03/17 0742 04/04/17 0458  NA 143  < >  142 140 141  K 4.9  < > 3.6 3.5 3.4*  CL 116*  < > 113* 112* 113*  CO2 16*  < > 21* 22 19*  BUN 114*  < > 79* 79* 76*  CREATININE 5.17*  < > 2.74* 2.82* 2.90*  CALCIUM 8.5*  < > 8.4* 8.3* 8.1*  PROT 5.5*  --   --   --   --   BILITOT 0.5  --   --   --   --   ALKPHOS 78  --   --   --   --   ALT 34  --   --   --   --   AST 57*  --   --   --   --   GLUCOSE 122*  < > 186* 143* 184*  < > =  values in this interval not displayed.  Imaging/Diagnostic Tests: CT ABD/Pelvis 9/14 >> 8.3 x 5.6 cm rounded complex abnormality seen adjacent to inferior portion of cecum with mild surrounding inflammation, enlarged and inflamed appendix, significant wall thickening of the terminal ileum, multiple exophytic cysts in both kidneys Renal US 9/14 >> no evidence of hydronephrosis, bilateral renal cysts  9/15 - OR Findings: Purulent fluid upon peritoneal entry - cultures sent. Fat containing ventral hernia ~4x4cm which was included in midline incision but ultimately closed primarily as part of the fascial closure. Appendix and cecum completely stuck to retroperitoneum in the right lower quadrant. Lateral wall of cecum and base of appendix appeared diseased and necrotic which necessitated an ileocecectomy. Ascending colon and distal ileum were normal appearing and healthy.  Daire Okimoto, [name], DO 04/04/2017, 12:39 PM PGY-1, [place] Intern pager: [phone], text pages welcome

## 2017-04-04 NOTE — Progress Notes (Signed)
Pt refusing CPAP at this time. I told him to let RN know if he changes his mind
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
## 2017-04-04 NOTE — Progress Notes (Signed)
CSW spoke with family regarding discharge plans to SNF. At this time family is requesting [facility] once stable for discharge. [facility] has given patient a bed offer and is able to provide patient with bed once stale for discharge.   Kingsley Spittle, [facility] Emergency Room Clinical Social Worker [phone]

## 2017-04-04 NOTE — Progress Notes (Signed)
Notified patient's son [name] of patient's fall. Patient's son acknowledged and thanked for informing him.

## 2017-04-04 NOTE — Progress Notes (Signed)
General Surgery [facility]  Assessment & Plan: POD#8 - s/p EXPLORATORY LAPAROTOMY, Ileocecectomy with ileocolic anastomosis 6/29 Dr. Dema Severin             - WBC stable at 17,  afebrile             - pain: scheduled tylenol, PRN oxycodone             - OOB/mobilize -PT/OT consult             - Incentive spirometry, pulmonary toilet  Plan             Soft diet  VAC dressing change on Monday 9/24             Encourage OOB and mobilization - PT/OT             Patient requesting rehab consultation / evaluation - case manager and SW to assess / assist         Earnstine Regal, MD, [facility]       Office: [phone]    Chief Complaint: Complex appendicitis, status post ileocecectomy  Subjective: Patient in bed, eating regular breakfast - limited intake.  No complaints.  Objective: Vital signs in last 24 hours: Temp:  [97.5 F (36.4 C)-98.2 F (36.8 C)] 98 F (36.7 C) (09/23 0458) Pulse Rate:  [69-72] 69 (09/23 0458) Resp:  [17] 17 (09/23 0458) BP: (130-174)/(51-72) 147/51 (09/23 0458) SpO2:  [95 %-97 %] 96 % (09/23 0458) Last BM Date: 04/03/17  Intake/Output from previous day: 09/22 0701 - 09/23 0700 In: 782 [P.O.:582; IV Piggyback:200] Out: 1027 [Urine:1625; Stool:2] Intake/Output this shift: No intake/output data recorded.  Physical Exam: HEENT - sclerae clear, mucous membranes moist Neck - soft Chest - clear bilaterally Cor - RRR Abdomen - soft, obese, mild distension; VAC intact in midline wound; non-tender Ext - no edema, non-tender Neuro - alert & oriented, no focal deficits  Lab Results:   Recent Labs  04/03/17 0742 04/04/17 0458  WBC 17.0* 17.4*  HGB 10.4* 9.7*  HCT 32.0* 30.0*  PLT 320 331   BMET  Recent Labs  04/03/17 0742 04/04/17 0458  NA 140 141  K 3.5 3.4*  CL 112* 113*  CO2 22 19*  GLUCOSE 143* 184*  BUN 79* 76*  CREATININE 2.82* 2.90*  CALCIUM 8.3* 8.1*   PT/INR No  results for input(s): LABPROT, INR in the last 72 hours. Comprehensive Metabolic Panel:    Component Value Date/Time   NA 141 04/04/2017 0458   NA 140 04/03/2017 0742   NA 139 09/18/2015 1546   K 3.4 (L) 04/04/2017 0458   K 3.5 04/03/2017 0742   CL 113 (H) 04/04/2017 0458   CL 112 (H) 04/03/2017 0742   CO2 19 (L) 04/04/2017 0458   CO2 22 04/03/2017 0742   BUN 76 (H) 04/04/2017 0458   BUN 79 (H) 04/03/2017 0742   BUN 45 (H) 09/18/2015 1546   CREATININE 2.90 (H) 04/04/2017 0458   CREATININE 2.82 (H) 04/03/2017 0742   CREATININE 1.94 (H) 10/04/2015 1159   CREATININE 2.46 (H) 09/27/2015 1057   GLUCOSE 184 (H) 04/04/2017 0458   GLUCOSE 143 (H) 04/03/2017 0742   CALCIUM 8.1 (L) 04/04/2017 0458   CALCIUM 8.3 (L) 04/03/2017 0742   AST 57 (H) 03/29/2017 0239   AST 59 (H) 03/27/2017 2120   ALT 34 03/29/2017 0239   ALT 33 03/27/2017 2120   ALKPHOS  78 03/29/2017 0239   ALKPHOS 52 03/27/2017 2120   BILITOT 0.5 03/29/2017 0239   BILITOT 0.7 03/27/2017 2120   PROT 5.5 (L) 03/29/2017 0239   PROT 4.4 (L) 03/27/2017 2120   ALBUMIN 2.3 (L) 03/29/2017 0239   ALBUMIN 2.0 (L) 03/27/2017 2120    Studies/Results: No results found.    Amed Datta M 04/04/2017  Patient ID: Rodney Farley, male   DOB: 1931/12/26, 83 y.o.   MRN: [id number]
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
## 2017-04-05 LAB — CBC
HEMATOCRIT: 29.1 % — AB (ref 39.0–52.0)
HEMOGLOBIN: 9.2 g/dL — AB (ref 13.0–17.0)
MCH: 28.7 pg (ref 26.0–34.0)
MCHC: 31.6 g/dL (ref 30.0–36.0)
MCV: 90.7 fL (ref 78.0–100.0)
Platelets: 346 10*3/uL (ref 150–400)
RBC: 3.21 MIL/uL — ABNORMAL LOW (ref 4.22–5.81)
RDW: 14.7 % (ref 11.5–15.5)
WBC: 17.3 10*3/uL — ABNORMAL HIGH (ref 4.0–10.5)

## 2017-04-05 LAB — BASIC METABOLIC PANEL
ANION GAP: 7 (ref 5–15)
BUN: 69 mg/dL — AB (ref 6–20)
CHLORIDE: 114 mmol/L — AB (ref 101–111)
CO2: 20 mmol/L — AB (ref 22–32)
Calcium: 8.3 mg/dL — ABNORMAL LOW (ref 8.9–10.3)
Creatinine, Ser: 2.77 mg/dL — ABNORMAL HIGH (ref 0.61–1.24)
GFR calc Af Amer: 23 mL/min — ABNORMAL LOW (ref >60)
GFR calc non Af Amer: 20 mL/min — ABNORMAL LOW (ref >60)
GLUCOSE: 144 mg/dL — AB (ref 65–99)
Potassium: 3.9 mmol/L (ref 3.5–5.1)
Sodium: 141 mmol/L (ref 135–145)

## 2017-04-05 LAB — GLUCOSE, CAPILLARY
GLUCOSE-CAPILLARY: 188 mg/dL — AB (ref 65–99)
GLUCOSE-CAPILLARY: 180 mg/dL — AB (ref 65–99)
Glucose-Capillary: 131 mg/dL — ABNORMAL HIGH (ref 65–99)
Glucose-Capillary: 169 mg/dL — ABNORMAL HIGH (ref 65–99)

## 2017-04-05 MED ORDER — CARVEDILOL 6.25 MG PO TABS: 6.2500 mg | ORAL_TABLET | Freq: Two times a day (BID) | ORAL | 0 refills | Status: DC

## 2017-04-05 MED ORDER — INSULIN ASPART 100 UNIT/ML ~~LOC~~ SOLN: 0.0000 [IU] | Freq: Three times a day (TID) | SUBCUTANEOUS | 11 refills | Status: DC

## 2017-04-05 MED ORDER — LISINOPRIL 2.5 MG PO TABS: 2.5000 mg | ORAL_TABLET | Freq: Every day | ORAL | 0 refills | Status: DC

## 2017-04-05 MED ORDER — POLYETHYLENE GLYCOL 3350 17 G PO PACK
17.0000 g | PACK | Freq: Every day | ORAL | 0 refills | Status: DC
Start: 2017-04-06 — End: 2019-02-01

## 2017-04-05 MED ORDER — OXYCODONE HCL 5 MG PO TABS: 5.0000 mg | ORAL_TABLET | Freq: Four times a day (QID) | ORAL | 0 refills | Status: DC | PRN

## 2017-04-05 MED ORDER — INSULIN ASPART 100 UNIT/ML ~~LOC~~ SOLN: 0.0000 [IU] | Freq: Every day | SUBCUTANEOUS | 11 refills | Status: DC

## 2017-04-05 MED ORDER — ACETAMINOPHEN 325 MG PO TABS: 650.0000 mg | ORAL_TABLET | Freq: Four times a day (QID) | ORAL | 0 refills | Status: DC

## 2017-04-05 NOTE — Progress Notes (Signed)
Physical Therapy Treatment Patient Details Name: Rodney Farley MRN: [id number] DOB: 08/09/1931 Today's Date: 04/05/2017    History of Present Illness 83 y.o. male presenting with RLQ abdominal pain found to have ruptured appendix with abscess. S/P ex lap, appendectomy midline fascia closed and skin left open with ABD pads secured by tape.  Briefly required neosynephrine for OR.  Extubated and returned to ICU / off pressors.     PT Comments    Patient progressing with mobility able to ambulate some in hallway with chair following for safety due to pt fatigues.  Feel continued skilled PT in the acute setting indicated to progress mobility, strength, balance and endurance.  Continues to be appropriate for SNF level rehab at d/c.  Follow Up Recommendations  SNF     Equipment Recommendations  Other (comment) (TBA at next venue)    Recommendations for Other Services       Precautions / Restrictions Precautions Precautions: Fall Precaution Comments: abdominal wound vac    Mobility  Bed Mobility Overal bed mobility: Needs Assistance Bed Mobility: Rolling;Sidelying to Sit Rolling: Mod assist Sidelying to sit: Mod assist       General bed mobility comments: assist to push up after legs off bed  Transfers Overall transfer level: Needs assistance Equipment used: Rolling walker (2 wheeled) Transfers: Sit to/from [facility] Sit to Stand: Mod assist;+2 physical assistance Stand pivot transfers: Mod assist       General transfer comment: lifting help from EOB, cues for hand placement  Ambulation/Gait Ambulation/Gait assistance: Min assist;Mod assist;+2 safety/equipment Ambulation Distance (Feet): 50 Feet Assistive device: Rolling walker (2 wheeled) Gait Pattern/deviations: Step-through pattern;Step-to pattern;Trunk flexed;Wide base of support     General Gait Details: patient reminding himself to stand erect and keep eyes forward, difficulty with turns  due to walker too far anterior. Cues and assist for safety; second helper for chair close   Stairs            Wheelchair Mobility    Modified Rankin (Stroke Patients Only)       Balance Overall balance assessment: Needs assistance         Standing balance support: Bilateral upper extremity supported Standing balance-Leahy Scale: Poor Standing balance comment: reliant on Bil UE support and support of therapist in standing                            Cognition Arousal/Alertness: Awake/alert Behavior During Therapy: WFL for tasks assessed/performed Overall Cognitive Status: Within Functional Limits for tasks assessed                                        Exercises      General Comments General comments (skin integrity, edema, etc.): incontinent of urine on BSC due to condom cath came off      Pertinent Vitals/Pain Faces Pain Scale: Hurts whole lot Pain Location: abdominal incision site Pain Descriptors / Indicators: Operative site guarding;Tightness Pain Intervention(s): Monitored during session;Repositioned;Limited activity within patient's tolerance    Home Living                      Prior Function            PT Goals (current goals can now be found in the care plan section) Progress towards PT goals: Progressing toward goals  Frequency    Min 3X/week      PT Plan Current plan remains appropriate    Co-evaluation              AM-PAC PT "6 Clicks" Daily Activity  Outcome Measure  Difficulty turning over in bed (including adjusting bedclothes, sheets and blankets)?: Unable Difficulty moving from lying on back to sitting on the side of the bed? : Unable Difficulty sitting down on and standing up from a chair with arms (e.g., wheelchair, bedside commode, etc,.)?: Unable Help needed moving to and from a bed to chair (including a wheelchair)?: A Lot Help needed walking in hospital room?: A Little Help  needed climbing 3-5 steps with a railing? : A Lot 6 Click Score: 10    End of Session Equipment Utilized During Treatment: Gait belt Activity Tolerance: Patient tolerated treatment well Patient left: in chair;with call bell/phone within reach;with family/visitor present;with chair alarm set   PT Visit Diagnosis: Unsteadiness on feet (R26.81);Other abnormalities of gait and mobility (R26.89);Muscle weakness (generalized) (M62.81);Difficulty in walking, not elsewhere classified (R26.2)     Time: 1165-7903 PT Time Calculation (min) (ACUTE ONLY): 29 min  Charges:  $Gait Training: 8-22 mins $Therapeutic Activity: 8-22 mins                    G CodesMagda Kiel, [place] [phone] 04/05/2017    Reginia Naas 04/05/2017, 3:45 PM

## 2017-04-05 NOTE — Progress Notes (Signed)
CSW awaiting [employer] authorization for patient SNF admission. [employer] requesting updated PT notes, as patient not seen by PT since 9/20. [employer] also requesting clarification on patient's wound care and pain management. CSW to provide additional information when updated PT note in. CSW informed [facility] of pending auth. CSW to follow and support with discharge to SNF.  Estanislado Emms, [place]

## 2017-04-05 NOTE — Clinical Social Work Note (Signed)
[facility] can take pt today. CSW initiated insurance auth. Resident paged.   [place], [place]

## 2017-04-05 NOTE — Progress Notes (Signed)
Patient's son was concerned about WBC remaining elevated. I called Dr. Dema Severin regarding the families concern and he said to let the patient stay overnight to do a repeat CBC in the morning. Dr. Enid Derry called, discharge removed for today and is now pending for tomorrow.   Social worker has confirmed all insurance authorization requirements have been approved. Patient should be able to transfer to SNF once discharged.

## 2017-04-05 NOTE — Progress Notes (Signed)
Family Medicine Teaching Service Daily Progress Note Intern Pager: [phone]  Patient name: Rodney Farley Medical record number: [id number] Date of birth: November 02, 1931 Age: 83 y.o. Gender: male  Primary Care Provider: [name], MD Consultants: Nephrology Code Status: FULL  Pt Overview and Major Events to Date:  9/15  s/p ex lap with appendectomy and wash out 9/16  PCA initiated for pain control  9/17  Hemodynamically stable, transferred to med-surg  Assessment and Plan:  Ruptured appendicitis s/p ileocecectomy: POD #7. Pain improving per patient. WBCs slowly trending down.  - Surgery following, appreciate recs - Completed 10 day abx course with Unasyn  - Tolerating soft diet - Incentive spirometry q 1h - Protonix 40mg  po - Continue scheduled Tylenol and Oxy IR 5 mg q4 prn. - PT/OT recommending SNF - Foley out - WBC 17.3 on 09/24  AKI on CKD stage IV: Resolved. Cr bumped slightly from 2.74 yesterday to 2.77 today. - Lisinopril 2.5mg  started 9/21, will need to monitor Cr closely - Trend BMPs - Holding Lasix  HFpEF: Does not appear fluid overloaded this morning. Weight 214lb this morning, from 222lb yesterday (weights have ranged from 220-240 at recent outpatient visits). UOP 1.3L in the last 24 hours. - Continue to hold Lasix for now, can restart as needed - On Coreg 6.25 BID  HTN: BP mildly elevated this morning to 157/63. - Continue Coreg 6.25 BID - Lisinopril 2.5 mg daily started 9/21 - Will monitor BPs closely, may need to restart home dosage of coreg 12.5 BID if heart rate ok  T2DM: CBGs ranging from 143-175 in the last 24 hours. Has received 6 units per sliding scale. On Lantus 60 units at home. - Continue to hold Lantus, add back as needed - Continue sensitive SSI  MDD: Patient's wife recently left him 1 month ago. Being treated with Zoloft. Likely switch to Wellbutrin at discharge - Continue Zoloft home dose for now  H/o complete AV block s/p  pacemaker: Stable.  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 - Refusing CPAP at night.  BPH: Stable. - Continue home flomax  HLD: Stable.  - Continue home Lipitor 40mg   GERD: Stable. - Continue home Protonix  FEN/GI: Soft diet, Miralax daily given straining with BMs. PPx: Heparin  Disposition: Medically stable for SNF  Subjective:  Patient says he is drowsy this morning. Some abdominal pain on palpation in LLQ.   Objective: Temp:  [97.9 F (36.6 C)-99 F (37.2 C)] 98.8 F (37.1 C) (09/24 0513) Pulse Rate:  [69-82] 72 (09/24 0513) Resp:  [18-20] 18 (09/24 0513) BP: (154-189)/(63-68) 157/63 (09/24 0513) SpO2:  [95 %-99 %] 96 % (09/24 0513) Weight:  [214 lb 3.2 oz (97.2 kg)] 214 lb 3.2 oz (97.2 kg) (09/24 0500) Physical Exam: General: NAD, pleasant Cardiovascular: RRR, normal S1, S2, no murmurs Respiratory: CTAB, no wheezes in frontal lung fields Abdomen: soft, moderate distention, mild tenderness to palpation in LLQ, midline surgical wound with wound vac in place Extremities: trace lower extremity edema Neuro: A&O, appropriate affect  Laboratory:  Recent Labs Lab 04/03/17 0742 04/04/17 0458 04/05/17 0226  WBC 17.0* 17.4* 17.3*  HGB 10.4* 9.7* 9.2*  HCT 32.0* 30.0* 29.1*  PLT 320 331 346    Recent Labs Lab 04/03/17 0742 04/04/17 0458 04/05/17 0226  NA 140 141 141  K 3.5 3.4* 3.9  CL 112* 113* 114*  CO2 22 19* 20*  BUN 79* 76* 69*  CREATININE 2.82* 2.90* 2.77*  CALCIUM 8.3* 8.1* 8.3*  GLUCOSE 143* 184* 144*    Imaging/Diagnostic Tests: CT ABD/Pelvis 9/14 >> 8.3 x 5.6 cm rounded complex abnormality seen adjacent to inferior portion of cecum with mild surrounding inflammation, enlarged and inflamed appendix, significant wall thickening of the terminal ileum, multiple exophytic cysts in both kidneys Renal US 9/14 >> no evidence of hydronephrosis,  bilateral renal cysts  9/15 - OR Findings: Purulent fluid upon peritoneal entry - cultures sent. Fat containing ventral hernia ~4x4cm which was included in midline incision but ultimately closed primarily as part of the fascial closure. Appendix and cecum completely stuck to retroperitoneum in the right lower quadrant. Lateral wall of cecum and base of appendix appeared diseased and necrotic which necessitated an ileocecectomy. Ascending colon and distal ileum were normal appearing and healthy.  Schylar Allard, [name], DO 04/05/2017, 6:59 AM PGY-1, [facility] Intern pager: [phone], text pages welcome

## 2017-04-05 NOTE — Progress Notes (Signed)
Subjective No acute events. Having BMs, tolerating full liquids without n/v. Denies complaints  Objective: Vital signs in last 24 hours: Temp:  [97.9 F (36.6 C)-99 F (37.2 C)] 98.8 F (37.1 C) (09/24 0513) Pulse Rate:  [69-82] 72 (09/24 0513) Resp:  [18-20] 18 (09/24 0513) BP: (154-189)/(63-68) 157/63 (09/24 0513) SpO2:  [95 %-99 %] 96 % (09/24 0513) Weight:  [97.2 kg (214 lb 3.2 oz)] 97.2 kg (214 lb 3.2 oz) (09/24 0500) Last BM Date: 04/04/17  Intake/Output from previous day: 09/23 0701 - 09/24 0700 In: 20 [P.O.:20] Out: 1300 [Urine:1300] Intake/Output this shift: No intake/output data recorded.  Gen: NAD, comfortable CV: RRR Pulm: Normal work of breathing Abd: obese, soft, NT. Incision with VAC in place Ext: SCDs in place  Lab Results: CBC   Recent Labs  04/04/17 0458 04/05/17 0226  WBC 17.4* 17.3*  HGB 9.7* 9.2*  HCT 30.0* 29.1*  PLT 331 346   BMET  Recent Labs  04/04/17 0458 04/05/17 0226  NA 141 141  K 3.4* 3.9  CL 113* 114*  CO2 19* 20*  GLUCOSE 184* 144*  BUN 76* 69*  CREATININE 2.90* 2.77*  CALCIUM 8.1* 8.3*   PT/INR No results for input(s): LABPROT, INR in the last 72 hours. ABG No results for input(s): PHART, HCO3 in the last 72 hours.  Invalid input(s): PCO2, PO2  Studies/Results:  Anti-infectives: Anti-infectives    Start     Dose/Rate Route Frequency Ordered Stop   04/04/17 1200  Ampicillin-Sulbactam (UNASYN) 3 g in sodium chloride 0.9 % 100 mL IVPB     3 g 200 mL/hr over 30 Minutes Intravenous Every 12 hours 04/04/17 1116 04/04/17 1311   04/01/17 0000  Ampicillin-Sulbactam (UNASYN) 3 g in sodium chloride 0.9 % 100 mL IVPB  Status:  Discontinued     3 g 200 mL/hr over 30 Minutes Intravenous Every 12 hours 03/31/17 1443 04/04/17 1116   03/30/17 1200  meropenem (MERREM) 1 g in sodium chloride 0.9 % 100 mL IVPB  Status:  Discontinued     1 g 200 mL/hr over 30 Minutes Intravenous Every 12 hours 03/30/17 1127 03/31/17 1443   03/27/17 1230  meropenem (MERREM) 1 g in sodium chloride 0.9 % 100 mL IVPB  Status:  Discontinued     1 g 200 mL/hr over 30 Minutes Intravenous Every 24 hours 03/27/17 1153 03/30/17 1127   03/26/17 2200  piperacillin-tazobactam (ZOSYN) IVPB 2.25 g  Status:  Discontinued     2.25 g 100 mL/hr over 30 Minutes Intravenous Every 8 hours 03/26/17 1914 03/27/17 1153   03/26/17 1600  piperacillin-tazobactam (ZOSYN) IVPB 3.375 g     3.375 g 100 mL/hr over 30 Minutes Intravenous  Once 03/26/17 1555 03/26/17 1811       Assessment/Plan: Patient Active Problem List   Diagnosis Date Noted  . Appendicitis with peritonitis   . Oliguria   . Intra-abdominal abscess ([place])   . Ruptured appendicitis 03/26/2017  . Marital stress 02/18/2017  . Current use of long term anticoagulation - Plavix 06/10/2016  . Trigger finger 06/10/2016  . BPH associated with nocturia 04/27/2016  . Diabetic peripheral neuropathy ([place])   . COPD (chronic obstructive pulmonary disease) ([place])   . Bell's palsy   . Gout 06/26/2015  . AKI (acute kidney injury) ([place])   . Chronic systolic CHF (congestive heart failure) ([place]) 04/08/2015  . Diverticulitis 04/08/2015  . DOE (dyspnea on exertion), due to significant CAD 03/28/2015  . Chronic kidney disease (CKD), stage IV (severe) (  [place]) 03/04/2015  . Coronary artery disease involving native coronary artery of native heart with angina pectoris ([place]) 09/13/2014  . GERD (gastroesophageal reflux disease) 08/07/2012  . Depression 08/05/2012  . Sleep apnea 08/05/2012  . Uncontrolled type 2 diabetes mellitus with stage 3 chronic kidney disease, with long-term current use of insulin ([place]) 08/05/2012  . HTN (hypertension) 08/05/2012  . Ischemic cardiomyopathy 11/03/2011  . Atrioventricular block, complete ([place])   . Obesity   . Pacemaker    POD#9 - s/p EXPLORATORY LAPAROTOMY, Ileocecectomy with ileocolic anastomosis 4/82 Dr. Dema Severin - WBC stable at 17,afebrile -  pain: scheduled tylenol, PRN oxycodone - OOB/mobilize -PT/OT - Incentive spirometry, pulmonary toilet   Plan Soft diet             VAC dressing change today Encourage OOB and mobilization - PT/OT SNF bed at [facility] pending   LOS: 10 days   Ileana Roup, MD [facility]

## 2017-04-05 NOTE — Progress Notes (Signed)
CSW received patient's [place] authorization for SNF, auth [id number] for 7 days from today. CSW left voicemial for [facility] SNF with auth info. CSW informed by nursing that patient would not discharge today. CSW updated SNF and patient's son, Jaycion Treml. CSW to support with discharge to SNF when ready.  Estanislado Emms, [place]

## 2017-04-06 DIAGNOSIS — E1122 Type 2 diabetes mellitus with diabetic chronic kidney disease: Secondary | ICD-10-CM | POA: Diagnosis not present

## 2017-04-06 DIAGNOSIS — K36 Other appendicitis: Secondary | ICD-10-CM | POA: Diagnosis not present

## 2017-04-06 DIAGNOSIS — R2689 Other abnormalities of gait and mobility: Secondary | ICD-10-CM | POA: Diagnosis not present

## 2017-04-06 DIAGNOSIS — I10 Essential (primary) hypertension: Secondary | ICD-10-CM | POA: Diagnosis not present

## 2017-04-06 DIAGNOSIS — I2510 Atherosclerotic heart disease of native coronary artery without angina pectoris: Secondary | ICD-10-CM | POA: Diagnosis not present

## 2017-04-06 DIAGNOSIS — G4730 Sleep apnea, unspecified: Secondary | ICD-10-CM | POA: Diagnosis not present

## 2017-04-06 DIAGNOSIS — F329 Major depressive disorder, single episode, unspecified: Secondary | ICD-10-CM | POA: Diagnosis not present

## 2017-04-06 DIAGNOSIS — R1312 Dysphagia, oropharyngeal phase: Secondary | ICD-10-CM | POA: Diagnosis not present

## 2017-04-06 DIAGNOSIS — J449 Chronic obstructive pulmonary disease, unspecified: Secondary | ICD-10-CM | POA: Diagnosis not present

## 2017-04-06 DIAGNOSIS — M6281 Muscle weakness (generalized): Secondary | ICD-10-CM | POA: Diagnosis not present

## 2017-04-06 DIAGNOSIS — N184 Chronic kidney disease, stage 4 (severe): Secondary | ICD-10-CM | POA: Diagnosis not present

## 2017-04-06 DIAGNOSIS — N179 Acute kidney failure, unspecified: Secondary | ICD-10-CM | POA: Diagnosis not present

## 2017-04-06 DIAGNOSIS — K352 Acute appendicitis with generalized peritonitis: Secondary | ICD-10-CM | POA: Diagnosis not present

## 2017-04-06 DIAGNOSIS — N400 Enlarged prostate without lower urinary tract symptoms: Secondary | ICD-10-CM | POA: Diagnosis not present

## 2017-04-06 DIAGNOSIS — K651 Peritoneal abscess: Secondary | ICD-10-CM | POA: Diagnosis not present

## 2017-04-06 DIAGNOSIS — R278 Other lack of coordination: Secondary | ICD-10-CM | POA: Diagnosis not present

## 2017-04-06 DIAGNOSIS — K3580 Unspecified acute appendicitis: Secondary | ICD-10-CM | POA: Diagnosis not present

## 2017-04-06 DIAGNOSIS — R100 Acute abdomen: Secondary | ICD-10-CM | POA: Diagnosis not present

## 2017-04-06 DIAGNOSIS — I5022 Chronic systolic (congestive) heart failure: Secondary | ICD-10-CM | POA: Diagnosis not present

## 2017-04-06 DIAGNOSIS — E785 Hyperlipidemia, unspecified: Secondary | ICD-10-CM | POA: Diagnosis not present

## 2017-04-06 DIAGNOSIS — K65 Peritonitis: Secondary | ICD-10-CM | POA: Diagnosis not present

## 2017-04-06 DIAGNOSIS — E1140 Type 2 diabetes mellitus with diabetic neuropathy, unspecified: Secondary | ICD-10-CM | POA: Diagnosis not present

## 2017-04-06 LAB — BASIC METABOLIC PANEL
ANION GAP: 5 (ref 5–15)
BUN: 63 mg/dL — AB (ref 6–20)
CHLORIDE: 115 mmol/L — AB (ref 101–111)
CO2: 21 mmol/L — ABNORMAL LOW (ref 22–32)
Calcium: 8.5 mg/dL — ABNORMAL LOW (ref 8.9–10.3)
Creatinine, Ser: 2.56 mg/dL — ABNORMAL HIGH (ref 0.61–1.24)
GFR calc non Af Amer: 22 mL/min — ABNORMAL LOW (ref >60)
GFR, EST AFRICAN AMERICAN: 25 mL/min — AB (ref >60)
Glucose, Bld: 186 mg/dL — ABNORMAL HIGH (ref 65–99)
POTASSIUM: 4.2 mmol/L (ref 3.5–5.1)
SODIUM: 141 mmol/L (ref 135–145)

## 2017-04-06 LAB — GLUCOSE, CAPILLARY
GLUCOSE-CAPILLARY: 172 mg/dL — AB (ref 65–99)
GLUCOSE-CAPILLARY: 174 mg/dL — AB (ref 65–99)

## 2017-04-06 LAB — CBC
HEMATOCRIT: 29 % — AB (ref 39.0–52.0)
Hemoglobin: 9.2 g/dL — ABNORMAL LOW (ref 13.0–17.0)
MCH: 29.2 pg (ref 26.0–34.0)
MCHC: 31.7 g/dL (ref 30.0–36.0)
MCV: 92.1 fL (ref 78.0–100.0)
Platelets: 357 10*3/uL (ref 150–400)
RBC: 3.15 MIL/uL — ABNORMAL LOW (ref 4.22–5.81)
RDW: 14.9 % (ref 11.5–15.5)
WBC: 16 10*3/uL — AB (ref 4.0–10.5)

## 2017-04-06 NOTE — Progress Notes (Signed)
Family Medicine Teaching Service Daily Progress Note Intern Pager: [phone]  Patient name: Rodney Farley Medical record number: [id number] Date of birth: 10-Feb-1932 Age: 83 y.o. Gender: male  Primary Care Provider: [name], MD Consultants: Nephrology Code Status: FULL  Pt Overview and Major Events to Date:  9/15  s/p ex lap with appendectomy and wash out 9/16  PCA initiated for pain control  9/17  Hemodynamically stable, transferred to med-surg  Assessment and Plan:  Ruptured appendicitis s/p ileocecectomy: POD #10. Pain is absnet. WBCs continue to trend down.  - Surgery following, appreciate recs- ready for discharge - Completed 10 day abx course with Unasyn  - Tolerating soft diet - Incentive spirometry q 1h - Protonix 40mg  po - Continue scheduled Tylenol and not requiring Oxy IR 5 mg q4 prn. - PT/OT to SNF - WBC 16.0 on 09/25  AKI on CKD stage IV: Resolved. Cr bumped slightly from 2.74 yesterday to 2.56 today. - Lisinopril 2.5mg  started 9/21, will need to monitor Cr closely- can consider increasing as outpatient, Cr improving more than baseline - Trend BMPs - Holding Lasix  HFpEF: Does not appear fluid overloaded this morning. Weight 214lb this morning, from 222lb yesterday (weights have ranged from 220-240 at recent outpatient visits).  - Continue to hold Lasix for now, can restart as needed - On Coreg 6.25 BID  HTN: BP mildly elevated this morning to 157/63. - Continue Coreg 6.25 BID - Lisinopril 2.5 mg daily started 9/21 - Will monitor BPs closely, may need to restart home dosage of coreg 12.5 BID if heart rate ok  T2DM: CBGs ranging from 143-175 in the last 24 hours. Has received 5 units per sliding scale. On Lantus 60 units at home. - Continue to hold Lantus, add back as needed- likely increased demand due to underlying infection - Continue sensitive SSI  MDD: Patient's wife recently left him 1 month ago. Being treated with Zoloft. Likely switch  to Wellbutrin at discharge - Continue Zoloft home dose for now  H/o complete AV block s/p pacemaker: Stable.  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 - Refusing CPAP at night.  BPH: Stable. - Continue home flomax  HLD: Stable.  - Continue home Lipitor 40mg   GERD: Stable. - Continue home Protonix  FEN/GI: Soft diet. PPx: Heparin  Disposition: Medically stable for SNF  Subjective:  Patient sitting in chair with breakfast, had recent BM, feeling well and ready for SNF.  Objective: Temp:  [98.5 F (36.9 C)-98.7 F (37.1 C)] 98.7 F (37.1 C) (09/25 0700) Pulse Rate:  [70-76] 74 (09/25 0904) Resp:  [16-18] 18 (09/25 0700) BP: (131-148)/(51-62) 142/62 (09/25 0904) SpO2:  [96 %-97 %] 97 % (09/25 0700) Physical Exam: General: NAD, pleasant Cardiovascular: RRR, normal S1, S2, no murmurs Respiratory: CTAB, normal work of breathing Abdomen: soft, moderate distention, no tenderness to palpation, midline surgical wound with wound vac in place Extremities: trace lower extremity edema Neuro: A&O, appropriate affect  Laboratory:  Recent Labs Lab 04/04/17 0458 04/05/17 0226 04/06/17 0445  WBC 17.4* 17.3* 16.0*  HGB 9.7* 9.2* 9.2*  HCT 30.0* 29.1* 29.0*  PLT 331 346 357    Recent Labs Lab 04/03/17 0742 04/04/17 0458 04/05/17 0226  NA 140 141 141  K 3.5 3.4* 3.9  CL 112* 113* 114*  CO2 22 19* 20*  BUN 79* 76* 69*  CREATININE 2.82* 2.90* 2.77*  CALCIUM 8.3*  8.1* 8.3*  GLUCOSE 143* 184* 144*    Imaging/Diagnostic Tests: CT ABD/Pelvis 9/14 >> 8.3 x 5.6 cm rounded complex abnormality seen adjacent to inferior portion of cecum with mild surrounding inflammation, enlarged and inflamed appendix, significant wall thickening of the terminal ileum, multiple exophytic cysts in both kidneys Renal US 9/14 >> no evidence of hydronephrosis, bilateral renal cysts  9/15 -  OR Findings: Purulent fluid upon peritoneal entry - cultures sent. Fat containing ventral hernia ~4x4cm which was included in midline incision but ultimately closed primarily as part of the fascial closure. Appendix and cecum completely stuck to retroperitoneum in the right lower quadrant. Lateral wall of cecum and base of appendix appeared diseased and necrotic which necessitated an ileocecectomy. Ascending colon and distal ileum were normal appearing and healthy.  Cicley Ganesh, [name], DO 04/06/2017, 9:47 AM PGY-1, [place] Intern pager: [phone], text pages welcome

## 2017-04-06 NOTE — Discharge Instructions (Signed)
You were admitted with a ruptured appendix and had an exploratory laparotomy which resulted in an ileocecectomy with ileocolic anastomosis preformed. You are being discharged to a skilled nursing facility to have rehabilitation after a big surgery. You will need follow up with your primary care doctor in one week, as well as follow up with the general surgeon.     Wound care:    The negative pressure dressing should be changed by nursing staff every Monday Wednesday and Friday     If the nursing staff at [facility] cannot do this, and the dressing changes may be changed to saline and fine-mesh gauze, wet to dry, twice a day     [facility], [place] [phone]  OPEN ABDOMINAL SURGERY: POST OP INSTRUCTIONS  Always review your discharge instruction sheet given to you by the facility where your surgery was performed.  IF YOU HAVE DISABILITY OR FAMILY LEAVE FORMS, YOU MUST BRING THEM TO THE OFFICE FOR PROCESSING.  PLEASE DO NOT GIVE THEM TO YOUR DOCTOR.  1. A prescription for pain medication may be given to you upon discharge.  Take your pain medication as prescribed, if needed.  If narcotic pain medicine is not needed, then you may take acetaminophen (Tylenol) or ibuprofen (Advil) as needed. 2. Take your usually prescribed medications unless otherwise directed. 3. If you need a refill on your pain medication, please contact your pharmacy. They will contact our office to request authorization.  Prescriptions will not be filled after 5pm or on week-ends. 4. You should follow a light diet the first few days after arrival home, such as soup and crackers, pudding, etc.unless your doctor has advised otherwise. A high-fiber, low fat diet can be resumed as tolerated.   Be sure to include lots of fluids daily. Most patients will experience some swelling and bruising on the chest and neck area.  Ice packs will help.  Swelling and bruising can take several days to resolve 5. Most  patients will experience some swelling and bruising in the area of the incision. Ice pack will help. Swelling and bruising can take several days to resolve..  6. It is common to experience some constipation if taking pain medication after surgery.  Increasing fluid intake and taking a stool softener will usually help or prevent this problem from occurring.  A mild laxative (Milk of Magnesia or Miralax) should be taken according to package directions if there are no bowel movements after 48 hours. 7.  You may have steri-strips (small skin tapes) in place directly over the incision.  These strips should be left on the skin for 7-10 days.  If your surgeon used skin glue on the incision, you may shower in 24 hours.  The glue will flake off over the next 2-3 weeks.  Any sutures or staples will be removed at the office during your follow-up visit. You may find that a light gauze bandage over your incision may keep your staples from being rubbed or pulled. You may shower and replace the bandage daily. 8. ACTIVITIES:  You may resume regular (light) daily activities beginning the next day--such as daily self-care, walking, climbing stairs--gradually increasing activities as tolerated.  You may have sexual intercourse when it is comfortable.  Refrain from any heavy lifting or straining until approved by your doctor. a. You may drive when you no longer are taking prescription pain medication, you can comfortably wear a seatbelt, and you can safely maneuver your car and apply brakes  b. Return to Work: ___________________________________ 44. You should see your doctor in the office for a follow-up appointment approximately two weeks after your surgery.  Make sure that you call for this appointment within a day or two after you arrive home to insure a convenient appointment time. OTHER INSTRUCTIONS:   _____________________________________________________________ _____________________________________________________________  WHEN TO CALL YOUR DOCTOR: 1. Fever over 101.0 2. Inability to urinate 3. Nausea and/or vomiting 4. Extreme swelling or bruising 5. Continued bleeding from incision. 6. Increased pain, redness, or drainage from the incision. 7. Difficulty swallowing or breathing 8. Muscle cramping or spasms. 9. Numbness or tingling in hands or feet or around lips.  The clinic staff is available to answer your questions during regular business hours.  Please dont hesitate to call and ask to speak to one of the nurses if you have concerns.  For further questions, please visit [email or web address]

## 2017-04-06 NOTE — Clinical Social Work Note (Addendum)
Clinical Social Worker facilitated patient discharge including contacting patient family and facility to confirm patient discharge plans.  Clinical information faxed to facility and family agreeable with plan.  CSW arranged ambulance transport via [facility] to [facility] .  RN to call [phone] for report prior to discharge.  Clinical Social Worker will sign off for now as social work intervention is no longer needed. Please consult [place] again if new need arises.  [place], [place]

## 2017-04-06 NOTE — Discharge Summary (Signed)
[facility] Discharge Summary  Patient name: Rodney Farley Medical record number: [id number] Date of birth: 1932-06-29 Age: 83 y.o. Gender: male Date of Admission: 03/26/2017  Date of Discharge: 04/06/17 Admitting Physician: Alveda Reasons, MD  Primary Care Provider: [name], MD Consultants: General Surgery, GI, Nephrology  Indication for Hospitalization: RLQ abdominal pain found to have ruptured appendicitis with abscess  Discharge Diagnoses/Problem List:  Ruptured appendicitis s/pileocecectomy AKI with CKD stage IV HFpEF HLD HTN T2DM MDD BPH  Disposition: SNF  Discharge Condition: stable  Discharge Exam:  General: NAD, pleasant Cardiovascular: RRR, normal S1, S2, no murmurs Respiratory: CTAB, normal work of breathing Abdomen: soft, moderate distention, notenderness to palpation, midline surgical wound with wound vac in place Extremities: trace lower extremity edema Neuro: A&O, appropriate affect  Brief Hospital Course:  Rodney Shorten Wilkinsonis a 83 y.o.malewho presented with RLQ abdominal pain found to have ruptured appendicitis with abscess. PMH is significant for COPD, CKD4, CAD with pacemaker and defibrillator, DM Type II, HTN, HFpEF, MDD and BPH. Surgery consulted and performedexploratory laparotomy with ileocecectomy on 03/27/2017. Patient extubated after surgery and returned to ICU. On 9/16PCA initiated for pain control. On 9/17 He was found to be hemodynamically stable, and was transferred to med-surg out of ICU. Patient received zosyn on admission and for 2 days (9/14-9/15). He was then narrowed to Meropenem (9/15-9/19). After culture from surgery returned, patient was narrowed to Unasyn and completed 10 day abx course with Unasyn (9/19-9/23). WBC was stable at 16.0on day of discharge. His diet was slowly increased to soft diet and his pain medications weaned. Discharged on soft diet.   This is discharge  summary from day of discharge. Previous summary written, but patient asked to stay to continue to monitor WBC as well as abdominal pain.   Issues for Follow Up:  1. Patient has been on clopidogrel for over two years. He should not be restarted on clopidogrel.  2. Ruptured appendicitis s/pileocecectomy: VAC dressing change Monday, Wednesday, Friday. WBC stable at 16, and patient afebrile. Continue scheduled Tylenol. Encourage OOB and mobilization with PT/OT. Follow-up with surgery, Dr. Annye English. 3. HTN: Patient started on Lisinopril 2.5 mg for HTN and protective kidney properties. Continue to monitor kidney function with his history of CKD. Will continue Coreg 6.25 mg BID, home dosage is 12.39m BID, will need close follow up and can increase as tolerated. Will discontinue home hydralazine and imdur.  4. T2DM: On home Lantus of 60 U, only receiving SSI while admitted. Will need to follow up for management of diabetes. Discharged with sensitive sliding scale. 5. MDD: Per family, patient to be changed from Zoloft to Wellbutrin, will need follow up with primary care to do this.  6. HFpEF: Weight 214lb on day of discharge. Holding Lasix while admitted. Monitor fluid status and resume home dose as appropriate.  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 Continue with liquifilm tears TID. Patient will need follow up with ophthalmologist as outpatient 8. OSA: Patient reports he does not use his CPAP at home. May need outpatient follow up and new sleep study.   Significant Procedures:  Exploratory Laparotomy: Ileocecectomy with ileocolic anastomosis  Significant Labs and Imaging:   Recent Labs Lab 04/04/17 0458 04/05/17 0226 04/06/17 0445  WBC 17.4* 17.3* 16.0*  HGB 9.7* 9.2* 9.2*  HCT 30.0* 29.1* 29.0*  PLT 331 346 357    Recent Labs Lab 04/02/17 0734109/22/18 [id number]/23/18 0458 04/05/17 0226 04/06/17 00240  NA 142 140 141 141 141  K 3.6 3.5 3.4* 3.9 4.2  CL 113* 112* 113*  114* 115*  CO2 21* 22 19* 20* 21*  GLUCOSE 186* 143* 184* 144* 186*  BUN 79* 79* 76* 69* 63*  CREATININE 2.74* 2.82* 2.90* 2.77* 2.56*  CALCIUM 8.4* 8.3* 8.1* 8.3* 8.5*   Ct Abdomen Pelvis Wo Contrast  Result Date: 03/26/2017 CLINICAL DATA:  Acute right-sided abdominal pain. EXAM: CT ABDOMEN AND PELVIS WITHOUT CONTRAST TECHNIQUE: Multidetector CT imaging of the abdomen and pelvis was performed following the standard protocol without IV contrast. COMPARISON:  None. FINDINGS: Lower chest: No acute abnormality. Hepatobiliary: No focal liver abnormality is seen. Status post cholecystectomy. No biliary dilatation. Pancreas: Unremarkable. No pancreatic ductal dilatation or surrounding inflammatory changes. Spleen: Normal in size without focal abnormality. Adrenals/Urinary Tract: Adrenal glands appear normal. Bilateral renal cysts are noted, some of which are hyperdense in appearance. No hydronephrosis or renal obstruction is noted. No renal or ureteral calculi are noted. Urinary bladder is unremarkable. Stomach/Bowel: The stomach appears normal. There is no evidence of bowel obstruction. There is a large rounded abnormality seen adjacent to the inferior portion of the cecum that measures 8.3 x 5.6 cm. Mild surrounding inflammation is noted. This is concerning for possible abscess or phlegmon status post ruptured appendicitis. There appears to be an enlarged and inflamed appendix in this area, although it is difficult to be certain given the degree of inflammation in the area. Significant wall thickening of the terminal ileum is noted which may be secondary to the inflammation. Vascular/Lymphatic: Aortic atherosclerosis. No enlarged abdominal or pelvic lymph nodes. Reproductive: Mild prostatic enlargement is noted. Other: No abdominal wall hernia or abnormality. No abdominopelvic ascites. Musculoskeletal: No acute or significant osseous findings. IMPRESSION: 8.3 x 5.6 cm rounded complex abnormality seen adjacent  to inferior portion of this cecum with mild surrounding inflammation. This is concerning for possible abscess or phlegmon related to ruptured appendicitis. Possible enlarged and inflamed appendix may be seen in this area, although it is difficult to be certain given the degree of inflammation present. Significant wall thickening of the terminal ileum is noted most likely secondary to adjacent inflammation. Critical Value/emergent results were called by telephone at the time of interpretation on 03/26/2017 at 12:40 pm to Dr. STEPHEN HUNTER , who verbally acknowledged these results. Aortic atherosclerosis. Mild prostatic enlargement. Multiple exophytic cysts are seen involving both kidneys, several which appear to be hyperdense and therefore most likely complex cysts. Ultrasound is recommended to rule out potential masses. Electronically Signed   By: James  Green Jr, M.D.   On: 03/26/2017 12:41   Us Renal  Result Date: 03/27/2017 CLINICAL DATA:  85-year-old male with acute kidney injury and oliguria. EXAM: RENAL / URINARY TRACT ULTRASOUND COMPLETE COMPARISON:  03/26/2017 CT and 10/17/2014 renal ultrasound FINDINGS: Right Kidney: Length: 11.2 cm. Cortical thinning and increased echogenicity noted with multiple renal cysts. No definite solid mass. No hydronephrosis. Left Kidney: Length: 10.3 cm. Cortical thinning and increased echogenicity noted with multiple renal cysts. No definite solid mass. No hydronephrosis Bladder: Appears normal for degree of bladder distention. IMPRESSION: 1. No evidence of hydronephrosis 2. Bilateral renal cortical thinning and increased echogenicity compatible with medical renal disease. 3. Bilateral renal cysts. Electronically Signed   By: Jeffrey  Hu M.D.   On: 03/27/2017 14:22   Dg Chest Port 1 View  Result Date: 03/27/2017 CLINICAL DATA:  Acute kidney injury. EXAM: PORTABLE CHEST 1 VIEW COMPARISON:  3/14/ 17 FINDINGS: Left chest wall AICD   is noted with leads in the right atrial  appendage and right ventricle. Moderate cardiac enlargement. Left pleural effusion and mild diffuse edema identified. IMPRESSION: Cardiac enlargement and mild CHF. Electronically Signed   By: Taylor  Stroud M.D.   On: 03/27/2017 21:51   Results/Tests Pending at Time of Discharge:   Discharge Medications:  Allergies as of 04/06/2017      Reactions   Bee Venom Anaphylaxis   Lyrica [pregabalin] Other (See Comments)   hallucinations   Prednisone Other (See Comments)   hallucinations   Zocor [simvastatin] Nausea Only, Other (See Comments)   Headache with brand name only.  Can take the generic.      Medication List    STOP taking these medications   buPROPion 150 MG 24 hr tablet Commonly known as:  WELLBUTRIN XL   clopidogrel 75 MG tablet Commonly known as:  PLAVIX   furosemide 40 MG tablet Commonly known as:  LASIX   hydrALAZINE 25 MG tablet Commonly known as:  APRESOLINE   Insulin Glargine 100 UNIT/ML Solostar Pen Commonly known as:  LANTUS SOLOSTAR   isosorbide mononitrate 30 MG 24 hr tablet Commonly known as:  IMDUR     TAKE these medications   acetaminophen 325 MG tablet Commonly known as:  TYLENOL Take 2 tablets (650 mg total) by mouth every 6 (six) hours.   aspirin EC 81 MG tablet Take 81 mg by mouth daily.   atorvastatin 40 MG tablet Commonly known as:  LIPITOR Take 1 tablet (40 mg total) by mouth daily. What changed:  when to take this   BLINK TEARS 0.25 % Soln Generic drug:  Polyethylene Glycol 400 Place 1 drop into both eyes 3 (three) times daily.   BLINK TEARS 0.25 % Gel Generic drug:  Polyethylene Glycol 400 Place 1 drop into both eyes at bedtime.   carvedilol 6.25 MG tablet Commonly known as:  COREG Take 1 tablet (6.25 mg total) by mouth 2 (two) times daily with a meal. What changed:  medication strength  how much to take   glucose blood test strip Commonly known as:  FREESTYLE TEST STRIPS Use as instructed   glucose monitoring kit  monitoring kit USE TO MONITOR BLOOD GLUCOSE AS DIRECTED   FREESTYLE LITE Devi   insulin aspart 100 UNIT/ML injection Commonly known as:  novoLOG Inject 0-5 Units into the skin at bedtime.   insulin aspart 100 UNIT/ML injection Commonly known as:  novoLOG Inject 0-9 Units into the skin 3 (three) times daily with meals.   Insulin Pen Needle 31G X 5 MM Misc Commonly known as:  UNIFINE PENTIPS USE AS DIRECTED   lisinopril 2.5 MG tablet Commonly known as:  PRINIVIL,ZESTRIL Take 1 tablet (2.5 mg total) by mouth daily.   nitroGLYCERIN 0.4 MG SL tablet Commonly known as:  NITROSTAT Place 1 tablet (0.4 mg total) under the tongue every 5 (five) minutes as needed for chest pain. 2 doses before calling for emergent help   oxyCODONE 5 MG immediate release tablet Commonly known as:  Oxy IR/ROXICODONE Take 1 tablet (5 mg total) by mouth every 6 (six) hours as needed for severe pain.   pantoprazole 40 MG tablet Commonly known as:  PROTONIX TAKE 1 TABLET BY MOUTH ONCE DAILY What changed:  See the new instructions.   polyethylene glycol packet Commonly known as:  MIRALAX / GLYCOLAX Take 17 g by mouth daily.   sertraline 100 MG tablet Commonly known as:  ZOLOFT Take 1.5 tablets (150 mg total) by mouth   daily.   tamsulosin 0.4 MG Caps capsule Commonly known as:  FLOMAX TAKE 1 CAPSULE BY MOUTH DAILY AFTER SUPPER What changed:  how much to take  how to take this  when to take this  additional instructions   vitamin B-12 1000 MCG tablet Commonly known as:  CYANOCOBALAMIN Take 1,000 mcg by mouth daily.            Discharge Care Instructions        Start     Ordered   04/06/17 0000  lisinopril (PRINIVIL,ZESTRIL) 2.5 MG tablet  Daily     04/05/17 1237   04/06/17 0000  polyethylene glycol (MIRALAX / GLYCOLAX) packet  Daily     04/05/17 1237   04/05/17 0000  acetaminophen (TYLENOL) 325 MG tablet  Every 6 hours     04/05/17 1237   04/05/17 0000  carvedilol (COREG) 6.25 MG  tablet  2 times daily with meals     04/05/17 1237   04/05/17 0000  insulin aspart (NOVOLOG) 100 UNIT/ML injection  Daily at bedtime     04/05/17 1237   04/05/17 0000  insulin aspart (NOVOLOG) 100 UNIT/ML injection  3 times daily with meals     04/05/17 1237   04/05/17 0000  oxyCODONE (OXY IR/ROXICODONE) 5 MG immediate release tablet  Every 6 hours PRN     04/05/17 1237      Discharge Instructions: Please refer to Patient Instructions section of EMR for full details.  Patient was counseled important signs and symptoms that should prompt return to medical care, changes in medications, dietary instructions, activity restrictions, and follow up appointments.   Follow-Up Appointments:  Contact information for follow-up providers    White, Christopher M, MD. Schedule an appointment as soon as possible for a visit in 2 week(s).   Specialty:  General Surgery Contact information: [address] [place] [address] [phone]            Contact information for after-discharge care    Destination    [facility] Follow up.   Specialty:  Skilled Nursing Facility Contact information: [address] [place] [address] [phone]                  Shirley, Jordan, DO 04/06/2017, 10:28 AM PGY-1, [facility] 

## 2017-04-06 NOTE — Discharge Planning (Signed)
Patient IV removed.  RN assessment and VS revealed stability for Discharge to facility.  Informed of suggested FU appts and appts made (10/9, arrive at 0900 for 0930 appt Dr. Dema Severin [address] Meds to be given at facility -No signed scripts needed at this time. [place] to be capped at discharge and facility to resume upon arrival.  Report called to [facility] s/w Edison Simon, RN.  EMS called to transport, going to room 3209 and will arrive about 1500.

## 2017-04-06 NOTE — Progress Notes (Signed)
10 Days Post-Op  Subjective: Stable and alert.denies pain, nausea.  Having bowel movements.  Tolerating soft diet.  No complaints. No respiratory problems.  WBC this morning 16,000.  Down from 17.3.  Hemoglobin 9.2 and stable.  Objective: Vital signs in last 24 hours: Temp:  [98.5 F (36.9 C)-98.7 F (37.1 C)] 98.5 F (36.9 C) (09/24 2155) Pulse Rate:  [70-76] 76 (09/24 2155) Resp:  [16] 16 (09/24 2155) BP: (131-148)/(51-60) 148/60 (09/24 2155) SpO2:  [96 %-97 %] 96 % (09/24 2155) Last BM Date: 04/04/17  Intake/Output from previous day: 09/24 0701 - 09/25 0700 In: 360 [P.O.:360] Out: -  Intake/Output this shift: Total I/O In: 360 [P.O.:360] Out: -     EXAM: Gen: NAD, comfortable CV: RRR. No ectopy. Pulm: Normal work of breathing. Lear to auscultation bilaterally Abd: obese, soft, NT. Incision with VAC in place. nondistended. Ext: SCDs in place   Lab Results:  Results for orders placed or performed during the hospital encounter of 03/26/17 (from the past 24 hour(s))  Glucose, capillary     Status: Abnormal   Collection Time: 04/05/17  7:37 AM  Result Value Ref Range   Glucose-Capillary 131 (H) 65 - 99 mg/dL  Glucose, capillary     Status: Abnormal   Collection Time: 04/05/17 11:52 AM  Result Value Ref Range   Glucose-Capillary 169 (H) 65 - 99 mg/dL  Glucose, capillary     Status: Abnormal   Collection Time: 04/05/17  4:21 PM  Result Value Ref Range   Glucose-Capillary 188 (H) 65 - 99 mg/dL  Glucose, capillary     Status: Abnormal   Collection Time: 04/05/17  9:51 PM  Result Value Ref Range   Glucose-Capillary 180 (H) 65 - 99 mg/dL  CBC     Status: Abnormal   Collection Time: 04/06/17  4:45 AM  Result Value Ref Range   WBC 16.0 (H) 4.0 - 10.5 K/uL   RBC 3.15 (L) 4.22 - 5.81 MIL/uL   Hemoglobin 9.2 (L) 13.0 - 17.0 g/dL   HCT 29.0 (L) 39.0 - 52.0 %   MCV 92.1 78.0 - 100.0 fL   MCH 29.2 26.0 - 34.0 pg   MCHC 31.7 30.0 - 36.0 g/dL   RDW 14.9 11.5 - 15.5 %    Platelets 357 150 - 400 K/uL     Studies/Results: No results found.  [name] acetaminophen  650 mg Oral Q6H  . atorvastatin  40 mg Oral QHS  . carvedilol  6.25 mg Oral BID WC  . heparin subcutaneous  5,000 Units Subcutaneous Q8H  . insulin aspart  0-5 Units Subcutaneous QHS  . insulin aspart  0-9 Units Subcutaneous TID WC  . lisinopril  2.5 mg Oral Daily  . pantoprazole  40 mg Oral QHS  . polyethylene glycol  17 g Oral Daily  . polyvinyl alcohol  1 drop Both Eyes TID  . sertraline  150 mg Oral QHS  . tamsulosin  0.4 mg Oral QPC supper     Assessment/Plan: s/p Procedure(s): EXPLORATORY LAPAROTOMY ILEOCECECTOMY   Patient Active Problem List   Diagnosis Date Noted  . Appendicitis with peritonitis   . Oliguria   . Intra-abdominal abscess ([place])   . Ruptured appendicitis 03/26/2017  . Marital stress 02/18/2017  . Current use of long term anticoagulation - Plavix 06/10/2016  . Trigger finger 06/10/2016  . BPH associated with nocturia 04/27/2016  . Diabetic peripheral neuropathy ([place])   . COPD (chronic obstructive pulmonary disease) ([place])   . Bell's palsy   .  Gout 06/26/2015  . AKI (acute kidney injury) ([place])   . Chronic systolic CHF (congestive heart failure) ([place]) 04/08/2015  . Diverticulitis 04/08/2015  . DOE (dyspnea on exertion), due to significant CAD 03/28/2015  . Chronic kidney disease (CKD), stage IV (severe) ([place]) 03/04/2015  . Coronary artery disease involving native coronary artery of native heart with angina pectoris ([place]) 09/13/2014  . GERD (gastroesophageal reflux disease) 08/07/2012  . Depression 08/05/2012  . Sleep apnea 08/05/2012  . Uncontrolled type 2 diabetes mellitus with stage 3 chronic kidney disease, with long-term current use of insulin ([place]) 08/05/2012  . HTN (hypertension) 08/05/2012  . Ischemic cardiomyopathy 11/03/2011  . Atrioventricular block, complete ([place])   . Obesity   . Pacemaker    POD#10- s/p EXPLORATORY LAPAROTOMY,  Ileocecectomy with ileocolic anastomosis 4/16 Dr. Dema Severin - WBC stable at16,afebrile, no tachycardia.  The significance of his leukocytosis is unclear.  This may be a self-limited process.  I am not inclined to do a sepsis workup. Defer to primary admitting service.  No indication for antibiotics - pain: scheduled tylenol, PRN oxycodone - OOB/mobilize -PT/OT - Incentive spirometry, pulmonary toilet   Plan Softdiet VAC dressing change Monday Wednesday Friday (wound care listed in DC instructions) Encourage OOB and mobilization - PT/OT fr om a surgical standpoint, he meets discharge criteria and may be discharged to [facility] when bed available and medical service approves.                 Follow-up in our office with Dr. Annye English loaded onto discharge summary (2 weeks)  @PROBHOSP @  LOS: 11 days    Daiya Tamer M 04/06/2017  . .prob

## 2017-04-07 ENCOUNTER — Ambulatory Visit: Payer: PPO | Admitting: Sports Medicine

## 2017-04-07 DIAGNOSIS — E1122 Type 2 diabetes mellitus with diabetic chronic kidney disease: Secondary | ICD-10-CM | POA: Diagnosis not present

## 2017-04-07 DIAGNOSIS — I5022 Chronic systolic (congestive) heart failure: Secondary | ICD-10-CM | POA: Diagnosis not present

## 2017-04-07 DIAGNOSIS — E785 Hyperlipidemia, unspecified: Secondary | ICD-10-CM | POA: Diagnosis not present

## 2017-04-07 DIAGNOSIS — I10 Essential (primary) hypertension: Secondary | ICD-10-CM | POA: Diagnosis not present

## 2017-04-07 DIAGNOSIS — K352 Acute appendicitis with generalized peritonitis: Secondary | ICD-10-CM | POA: Diagnosis not present

## 2017-04-07 DIAGNOSIS — N400 Enlarged prostate without lower urinary tract symptoms: Secondary | ICD-10-CM | POA: Diagnosis not present

## 2017-04-12 DIAGNOSIS — G4730 Sleep apnea, unspecified: Secondary | ICD-10-CM | POA: Diagnosis not present

## 2017-04-12 DIAGNOSIS — F329 Major depressive disorder, single episode, unspecified: Secondary | ICD-10-CM | POA: Diagnosis not present

## 2017-04-12 DIAGNOSIS — I5022 Chronic systolic (congestive) heart failure: Secondary | ICD-10-CM | POA: Diagnosis not present

## 2017-04-12 DIAGNOSIS — J449 Chronic obstructive pulmonary disease, unspecified: Secondary | ICD-10-CM | POA: Diagnosis not present

## 2017-04-12 DIAGNOSIS — R1312 Dysphagia, oropharyngeal phase: Secondary | ICD-10-CM | POA: Diagnosis not present

## 2017-04-12 DIAGNOSIS — M6281 Muscle weakness (generalized): Secondary | ICD-10-CM | POA: Diagnosis not present

## 2017-04-12 DIAGNOSIS — N184 Chronic kidney disease, stage 4 (severe): Secondary | ICD-10-CM | POA: Diagnosis not present

## 2017-04-12 DIAGNOSIS — I2510 Atherosclerotic heart disease of native coronary artery without angina pectoris: Secondary | ICD-10-CM | POA: Diagnosis not present

## 2017-04-12 DIAGNOSIS — R278 Other lack of coordination: Secondary | ICD-10-CM | POA: Diagnosis not present

## 2017-04-12 DIAGNOSIS — E1122 Type 2 diabetes mellitus with diabetic chronic kidney disease: Secondary | ICD-10-CM | POA: Diagnosis not present

## 2017-04-12 DIAGNOSIS — K651 Peritoneal abscess: Secondary | ICD-10-CM | POA: Diagnosis not present

## 2017-04-12 DIAGNOSIS — R2689 Other abnormalities of gait and mobility: Secondary | ICD-10-CM | POA: Diagnosis not present

## 2017-04-12 DIAGNOSIS — I10 Essential (primary) hypertension: Secondary | ICD-10-CM | POA: Diagnosis not present

## 2017-04-14 DIAGNOSIS — N184 Chronic kidney disease, stage 4 (severe): Secondary | ICD-10-CM | POA: Diagnosis not present

## 2017-04-14 DIAGNOSIS — E1122 Type 2 diabetes mellitus with diabetic chronic kidney disease: Secondary | ICD-10-CM | POA: Diagnosis not present

## 2017-04-14 DIAGNOSIS — K352 Acute appendicitis with generalized peritonitis: Secondary | ICD-10-CM | POA: Diagnosis not present

## 2017-04-14 DIAGNOSIS — I5022 Chronic systolic (congestive) heart failure: Secondary | ICD-10-CM | POA: Diagnosis not present

## 2017-04-21 DIAGNOSIS — K352 Acute appendicitis with generalized peritonitis: Secondary | ICD-10-CM | POA: Diagnosis not present

## 2017-04-21 DIAGNOSIS — I5022 Chronic systolic (congestive) heart failure: Secondary | ICD-10-CM | POA: Diagnosis not present

## 2017-04-21 DIAGNOSIS — N184 Chronic kidney disease, stage 4 (severe): Secondary | ICD-10-CM | POA: Diagnosis not present

## 2017-04-21 DIAGNOSIS — E1140 Type 2 diabetes mellitus with diabetic neuropathy, unspecified: Secondary | ICD-10-CM | POA: Diagnosis not present

## 2017-04-28 DIAGNOSIS — I10 Essential (primary) hypertension: Secondary | ICD-10-CM | POA: Diagnosis not present

## 2017-04-28 DIAGNOSIS — K352 Acute appendicitis with generalized peritonitis: Secondary | ICD-10-CM | POA: Diagnosis not present

## 2017-04-28 DIAGNOSIS — E1122 Type 2 diabetes mellitus with diabetic chronic kidney disease: Secondary | ICD-10-CM | POA: Diagnosis not present

## 2017-04-28 DIAGNOSIS — I5022 Chronic systolic (congestive) heart failure: Secondary | ICD-10-CM | POA: Diagnosis not present

## 2017-04-30 ENCOUNTER — Other Ambulatory Visit: Payer: Self-pay | Admitting: *Deleted

## 2017-04-30 DIAGNOSIS — J441 Chronic obstructive pulmonary disease with (acute) exacerbation: Secondary | ICD-10-CM

## 2017-04-30 NOTE — Patient Outreach (Signed)
[facility]) Care Management  04/30/2017  Rodney Farley [birth year] [id number]   Met with patient at bedside.  Patient has lots of questions about Home care and wound vac care. SW is out of facility for a meeting today. Patient reports that he had emergency surgery for ruptured appendix.  He is unsure how long he will have wound vac, but feels fortunate to be alive and to not have an ostomy.  He reports he has support from his son and daughter-in-law.  He reports his wife recently left him and he has been very saddened by this and depressed, teary when discussing.   Patient hopeful he will have full recovery from the surgery.  He does have other health issues: HF, DM, COPD, Bell's Palsy, HOH  RNCM reviewed [facility] care management services.  Patient very interested and wants to sign up for services.  He does want home visits as he has a hard time hearing on phone due to [facility] and complications from [place] Consent obtained at bedside  Plan: Refer To [place] for Transition of care Left message for SW at facility to discuss home care with patient. Royetta Crochet. Laymond Purser, RN, BSN, [place] [phone]) Business Cell  [phone]) Toll Free Office

## 2017-05-03 ENCOUNTER — Other Ambulatory Visit: Payer: Self-pay | Admitting: *Deleted

## 2017-05-03 NOTE — Patient Outreach (Signed)
[facility]) Care Management  05/03/2017  Chevy Sweigert [birth year] [id number]   RN attempted outreach call that was unsuccessful. RN unable to leave a message however will continue follow up calls accordingly and scheduled the next all tomorrow.  Raina Mina, RN Care Management Coordinator [place] Office [phone]

## 2017-05-04 ENCOUNTER — Other Ambulatory Visit: Payer: Self-pay | Admitting: *Deleted

## 2017-05-04 NOTE — Patient Outreach (Signed)
[facility]) Care Management  05/04/2017  Rodney Farley [birth year] [id number]   RN 2nd outreach attempt to reach pt however remains unsuccessful. Will reschedule another call from tomorrow for transition of care needs.  Raina Mina, RN Care Management Coordinator [place] Office [phone]

## 2017-05-05 ENCOUNTER — Other Ambulatory Visit: Payer: Self-pay | Admitting: *Deleted

## 2017-05-05 ENCOUNTER — Encounter: Payer: Self-pay | Admitting: *Deleted

## 2017-05-05 NOTE — Patient Outreach (Signed)
[facility]) Care Management  05/05/2017  Justice Milliron [birth year] [phone]   RN 3rd outreach attempt to reach pt however remains unsuccessful with an invalid number. Will mail outreach letter and await a response for possible [facility] services.  Raina Mina, RN Care Management Coordinator [place] Office [phone]

## 2017-05-06 DIAGNOSIS — E785 Hyperlipidemia, unspecified: Secondary | ICD-10-CM | POA: Diagnosis not present

## 2017-05-06 DIAGNOSIS — E1122 Type 2 diabetes mellitus with diabetic chronic kidney disease: Secondary | ICD-10-CM | POA: Diagnosis not present

## 2017-05-06 DIAGNOSIS — N184 Chronic kidney disease, stage 4 (severe): Secondary | ICD-10-CM | POA: Diagnosis not present

## 2017-05-06 DIAGNOSIS — G510 Bell's palsy: Secondary | ICD-10-CM | POA: Diagnosis not present

## 2017-05-06 DIAGNOSIS — Z48815 Encounter for surgical aftercare following surgery on the digestive system: Secondary | ICD-10-CM | POA: Diagnosis not present

## 2017-05-06 DIAGNOSIS — Z9181 History of falling: Secondary | ICD-10-CM | POA: Diagnosis not present

## 2017-05-06 DIAGNOSIS — G4730 Sleep apnea, unspecified: Secondary | ICD-10-CM | POA: Diagnosis not present

## 2017-05-06 DIAGNOSIS — R32 Unspecified urinary incontinence: Secondary | ICD-10-CM | POA: Diagnosis not present

## 2017-05-06 DIAGNOSIS — I2510 Atherosclerotic heart disease of native coronary artery without angina pectoris: Secondary | ICD-10-CM | POA: Diagnosis not present

## 2017-05-06 DIAGNOSIS — Z950 Presence of cardiac pacemaker: Secondary | ICD-10-CM | POA: Diagnosis not present

## 2017-05-06 DIAGNOSIS — I129 Hypertensive chronic kidney disease with stage 1 through stage 4 chronic kidney disease, or unspecified chronic kidney disease: Secondary | ICD-10-CM | POA: Diagnosis not present

## 2017-05-06 DIAGNOSIS — J449 Chronic obstructive pulmonary disease, unspecified: Secondary | ICD-10-CM | POA: Diagnosis not present

## 2017-05-06 DIAGNOSIS — N400 Enlarged prostate without lower urinary tract symptoms: Secondary | ICD-10-CM | POA: Diagnosis not present

## 2017-05-10 ENCOUNTER — Telehealth: Payer: Self-pay | Admitting: Family Medicine

## 2017-05-10 NOTE — Telephone Encounter (Signed)
Patient's son calling with concerns about father- 49) Stated orders from [place] agency should be coming in- asking for them to be signed and returned so he can start the process for skilled care in home 2) Needed hospital follow-up following surgery/hospitalization. Made appointment for 05/11/17, will call if any issues with appointment. Was worried about fathers weight loss, and medications.  Patient's son given reassurance that we will get provider paperwork once we receive.

## 2017-05-10 NOTE — Telephone Encounter (Signed)
I have not seen paperwork yet.  Happy to sign once it is available

## 2017-05-11 ENCOUNTER — Telehealth: Payer: Self-pay | Admitting: Family Medicine

## 2017-05-11 ENCOUNTER — Ambulatory Visit (INDEPENDENT_AMBULATORY_CARE_PROVIDER_SITE_OTHER): Payer: PPO | Admitting: Family Medicine

## 2017-05-11 ENCOUNTER — Encounter: Payer: Self-pay | Admitting: Family Medicine

## 2017-05-11 VITALS — BP 98/54 | HR 72 | Temp 98.3°F | Ht 67.0 in | Wt 209.8 lb

## 2017-05-11 DIAGNOSIS — Z794 Long term (current) use of insulin: Secondary | ICD-10-CM | POA: Diagnosis not present

## 2017-05-11 DIAGNOSIS — F3341 Major depressive disorder, recurrent, in partial remission: Secondary | ICD-10-CM

## 2017-05-11 DIAGNOSIS — N183 Chronic kidney disease, stage 3 (moderate): Secondary | ICD-10-CM

## 2017-05-11 DIAGNOSIS — Z23 Encounter for immunization: Secondary | ICD-10-CM | POA: Diagnosis not present

## 2017-05-11 DIAGNOSIS — I25119 Atherosclerotic heart disease of native coronary artery with unspecified angina pectoris: Secondary | ICD-10-CM | POA: Diagnosis not present

## 2017-05-11 DIAGNOSIS — IMO0002 Reserved for concepts with insufficient information to code with codable children: Secondary | ICD-10-CM

## 2017-05-11 DIAGNOSIS — E1122 Type 2 diabetes mellitus with diabetic chronic kidney disease: Secondary | ICD-10-CM

## 2017-05-11 DIAGNOSIS — E1165 Type 2 diabetes mellitus with hyperglycemia: Secondary | ICD-10-CM | POA: Diagnosis not present

## 2017-05-11 DIAGNOSIS — I5022 Chronic systolic (congestive) heart failure: Secondary | ICD-10-CM | POA: Diagnosis not present

## 2017-05-11 DIAGNOSIS — K3532 Acute appendicitis with perforation and localized peritonitis, without abscess: Secondary | ICD-10-CM | POA: Diagnosis not present

## 2017-05-11 DIAGNOSIS — R197 Diarrhea, unspecified: Secondary | ICD-10-CM

## 2017-05-11 DIAGNOSIS — N184 Chronic kidney disease, stage 4 (severe): Secondary | ICD-10-CM | POA: Diagnosis not present

## 2017-05-11 DIAGNOSIS — I10 Essential (primary) hypertension: Secondary | ICD-10-CM

## 2017-05-11 MED ORDER — TRIAMCINOLONE ACETONIDE 0.1 % EX CREA: 1.0000 "application " | TOPICAL_CREAM | Freq: Two times a day (BID) | CUTANEOUS | 0 refills | Status: DC

## 2017-05-11 NOTE — Progress Notes (Signed)
Subjective:  Rodney Farley is a 83 y.o. year old very pleasant male patient who presents for/with See problem oriented charting ROS-admits to pruritus of bilateral legs.  Has dry legs.  No chest pain or shortness of breath or increased swelling in his legs.  Had weight loss in relation to illness and surgery.  Past Medical History-  Patient Active Problem List   Diagnosis Date Noted  . Ruptured appendicitis 03/26/2017    Priority: High  . COPD (chronic obstructive pulmonary disease) ([place])     Priority: High  . Chronic systolic CHF (congestive heart failure) ([place]) 04/08/2015    Priority: High  . DOE (dyspnea on exertion), due to significant CAD 03/28/2015    Priority: High  . Chronic kidney disease (CKD), stage IV (severe) ([place]) 03/04/2015    Priority: High  . Coronary artery disease involving native coronary artery of native heart with angina pectoris ([place]) 09/13/2014    Priority: High  . Depression 08/05/2012    Priority: High  . Uncontrolled type 2 diabetes mellitus with stage 3 chronic kidney disease, with long-term current use of insulin ([place]) 08/05/2012    Priority: High  . Ischemic cardiomyopathy 11/03/2011    Priority: High  . Atrioventricular block, complete (HCC)     Priority: High  . Pacemaker     Priority: High  . BPH associated with nocturia 04/27/2016    Priority: Medium  . Diabetic peripheral neuropathy (HCC)     Priority: Medium  . Bell's palsy     Priority: Medium  . Gout 06/26/2015    Priority: Medium  . Sleep apnea 08/05/2012    Priority: Medium  . HTN (hypertension) 08/05/2012    Priority: Medium  . Diverticulitis 04/08/2015    Priority: Low  . GERD (gastroesophageal reflux disease) 08/07/2012    Priority: Low  . Obesity     Priority: Low  . Diarrhea 05/11/2017  . Marital stress 02/18/2017  . Trigger finger 06/10/2016    Medications- reviewed and updated Current Outpatient Prescriptions  Medication Sig Dispense Refill  . acetaminophen  (TYLENOL) 325 MG tablet Take 2 tablets (650 mg total) by mouth every 6 (six) hours. 120 tablet 0  . aspirin EC 81 MG tablet Take 81 mg by mouth daily.    [name] atorvastatin (LIPITOR) 40 MG tablet Take 1 tablet (40 mg total) by mouth daily. (Patient taking differently: Take 40 mg by mouth at bedtime. ) 90 tablet 3  . Blood Glucose Monitoring Suppl (FREESTYLE LITE) DEVI     . carvedilol (COREG) 6.25 MG tablet Take 1 tablet (6.25 mg total) by mouth 2 (two) times daily with a meal. 60 tablet 0  . gabapentin (NEURONTIN) 100 MG capsule Take 100 mg by mouth at bedtime.    [name] glucose blood (FREESTYLE TEST STRIPS) test strip Use as instructed 100 each 12  . glucose monitoring kit (FREESTYLE) monitoring kit USE TO MONITOR BLOOD GLUCOSE AS DIRECTED 1 each 1  . insulin aspart (NOVOLOG) 100 UNIT/ML injection Inject 0-5 Units into the skin at bedtime. 10 mL 11  . insulin aspart (NOVOLOG) 100 UNIT/ML injection Inject 0-9 Units into the skin 3 (three) times daily with meals. 10 mL 11  . Insulin Pen Needle (UNIFINE PENTIPS) 31G X 5 MM MISC USE AS DIRECTED 100 each 0  . lisinopril (PRINIVIL,ZESTRIL) 2.5 MG tablet Take 1 tablet (2.5 mg total) by mouth daily. 30 tablet 0  . nitroGLYCERIN (NITROSTAT) 0.4 MG SL tablet Place 1 tablet (0.4 mg total) under the  tongue every 5 (five) minutes as needed for chest pain. 2 doses before calling for emergent help 25 tablet 3  . pantoprazole (PROTONIX) 40 MG tablet TAKE 1 TABLET BY MOUTH ONCE DAILY (Patient taking differently: TAKE 1 TABLET (40 MG) BY MOUTH ONCE DAILY) 90 tablet 3  . polyethylene glycol (MIRALAX / GLYCOLAX) packet Take 17 g by mouth daily. 14 each 0  . Polyethylene Glycol 400 (BLINK TEARS) 0.25 % SOLN Place 1 drop into both eyes 3 (three) times daily.    . sertraline (ZOLOFT) 100 MG tablet Take 1.5 tablets (150 mg total) by mouth daily. 135 tablet 1  . tamsulosin (FLOMAX) 0.4 MG CAPS capsule TAKE 1 CAPSULE BY MOUTH DAILY AFTER SUPPER (Patient taking differently: Take 0.4  mg by mouth daily after supper. ) 90 capsule 3  . vitamin B-12 (CYANOCOBALAMIN) 1000 MCG tablet Take 1,000 mcg by mouth daily.       Objective: BP (!) 98/54 (BP Location: Left Arm, Patient Position: Sitting, Cuff Size: Large)   Pulse 72   Temp 98.3 F (36.8 C) (Oral)   Ht 5' 7" (1.702 m)   Wt 209 lb 12.8 oz (95.2 kg)   SpO2 97%   BMI 32.86 kg/m  Gen: NAD, resting comfortably, well-kept CV: RRR no murmurs rubs or gallops Lungs: CTAB no crackles, wheeze, rhonchi Abdomen: soft/nontender outside of surgical wound./nondistended/normal bowel sounds. No rebound or guarding.  Midline surgical wound healing well -near umbilicus has some tissue that is lagging in closing completely but has good pink granulation tissue Ext: trace edema Skin: warm, dry  Assessment/Plan:  Patient presents for hospital follow-up after being found to have ruptured appendicitis with abscess and requiring surgery as well as an 11-day hospitalization.  Exploratory laparotomy with ileocecectomy was completed on 03/27/2017.  Patient received IV antibiotics during hospitalization starting with Zosyn then changed to meropenem, later changed to [facility] she completed 10-day course.  He did have elevated white blood count at discharge at 16,000.  He tolerated slowly increasing his diet and was weaned off of medications.  He was treated with a wound VAC while in the hospital as well as during stay at [facility] skilled nursing facility.  Luckily this was able to be taken off before he was discharged home.  He is to follow-up with surgery Dr. Annye English.  He had several changes in the hospital including his Plavix being stopped due to concern it has been over 2 years since bare-metal stent-not clear Dr. [name] was made aware of this.  For his blood pressure he was continued on Coreg and had lisinopril added at low-dose.  His home hydralazine and Imdur were stopped.  Diabetes was managed with sliding scale insulin in  addition to home Lantus 60 units.  Patient tells me depression actually improved after surgery.  He had tremendous weight loss associated with surgery and illness.  He did not appear to require Lasix when discharged.  Despite history of diastolic heart failure.  He tells me he has tremendous support at home to church and he is also undergoing physical therapy.  He is thankful to be back home and his son lives just a few doors down.  Daughter-in-law with him today.  Depression S: Patient states he has had very minimal anhedonia or feelings of depressed mood.  He has been more fatigued since his surgeries as expected and has been sleeping more.  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  Patient has improved drastically since his  surgery but he did have depression prior to the surgery.  We will continue Zoloft 150 mg for at least 6 months then consider reduction to 100 mg if he continues to do well.  PHQ9 will likely be more helpful at next visit.   HTN (hypertension) S: Has had significant weight loss due to illness.  Controlled on Coreg 6.21m BID, lisinopril 2.53mbut overcontrolled. Lisinopril started by teaching service while hospitalized for potential renal protective effects.  Stopped during hospitalization- lasix 4037mID, hydralazine 43m69mD, imdur 30mg38mBP Readings from Last 3 Encounters:  05/11/17 (!) 98/54  04/06/17 (!) 144/56  03/25/17 132/64  A/P: We discussed blood pressure goal of <140/90.  We will stop the lisinopril as I do not think his blood pressure can tolerate this.  Continue Coreg   Uncontrolled type 2 diabetes mellitus with stage 3 chronic kidney disease, with long-term current use of insulin (HCC) S: sounds like improving controlled. On Lantus 60 units.  He denies low blood sugars despite the significant weight loss Lab Results  Component Value Date   HGBA1C 8.2 (H) 01/28/2017   HGBA1C 6.8 03/26/2016   HGBA1C 6.9  06/26/2015    A/P: 1 month follow-up and will repeat A1c at that time   Chronic kidney disease (CKD), stage IV (severe) (HCC) [place] some acute kidney injury around the time of hospitalization.  Appears GFR improved before discharge.  Update bmet today  Coronary artery disease involving native coronary artery of native heart with angina pectoris (HCC)[facility] Dr. JordaMartiniqueent was to be continued on dual antiplatelet therapy with aspirin and Plavix long-term.  It appears the teaching service stopped his Plavix due to bleeding risk surrounding surgery with ruptured appendix.  They also remarked that this should be permanently discontinued.  I would only want  remain off of this with Dr. JordaDoug Souoval-we will recheck through epic  Chronic systolic CHF (congestive heart failure) (HCC) Lasix 40mg 2m-> off after hospitalization and weight loss.  We discussed home monitoring of weights, edema, symptoms such as shortness of breath  Diarrhea He tells me he has had months of loose stools.  Often times going several times a day.  Last night he got up four times for example.  We will get stool cultures and C. difficile though with this duration do not strongly suspect bacterial cause.  Consider probiotics.  Dry skin/pruritus-use Eucerin cream.  Also instructed on use of triamcinolone to 7 days maximum  Future Appointments Date Time Provider [place] 2:40 PM [facility] DEVICE REMOTES [facility]  09/06/2017 9:45 AM [facility]in [facility] None   Return in about 4 weeks (around 06/08/2017) for follow up- or sooner if needed.  Orders Placed This Encounter  Procedures  . Stool culture    Standing Status:   Future    Standing Expiration Date:   07/11/2017  . Clostridium Difficile by PCR    Standing Status:   Future    Standing Expiration Date:   07/11/2017    Order Specific Question:   Is your patient experiencing loose or watery stools (3 or more in 24  hours)?    Answer:   Yes    Order Specific Question:   Has the patient received laxatives in the last 24 hours?    Answer:   No    Order Specific Question:   Has a negative Cdiff test resulted in the last 7 days?    Answer:   No  . Flu  vaccine HIGH DOSE PF  . CBC with Differential/Platelet  . Comprehensive metabolic panel    [place]    Meds ordered this encounter  Medications  . gabapentin (NEURONTIN) 100 MG capsule    Sig: Take 100 mg by mouth at bedtime.  . triamcinolone cream (KENALOG) 0.1 %    Sig: Apply 1 application topically 2 (two) times daily. For 7-10 days maximum    Dispense:  80 g    Refill:  0    Return precautions advised.  Garret Reddish, MD

## 2017-05-11 NOTE — Telephone Encounter (Signed)
Called and provided verbal orders as requested 

## 2017-05-11 NOTE — Assessment & Plan Note (Signed)
Had some acute kidney injury around the time of hospitalization.  Appears GFR improved before discharge.  Update bmet today

## 2017-05-11 NOTE — Telephone Encounter (Signed)
Patient is in office now seeing Dr. Yong Channel. I provided verbal orders for physical therapist. Family is aware

## 2017-05-11 NOTE — Assessment & Plan Note (Signed)
S: Patient states he has had very minimal anhedonia or feelings of depressed mood.  He has been more fatigued since his surgeries as expected and has been sleeping more.  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  Patient has improved drastically since his surgery but he did have depression prior to the surgery.  We will continue Zoloft 150 mg for at least 6 months then consider reduction to 100 mg if he continues to do well.  PHQ9 will likely be more helpful at next visit.

## 2017-05-11 NOTE — Assessment & Plan Note (Signed)
S: sounds like improving controlled. On Lantus 60 units.  He denies low blood sugars despite the significant weight loss Lab Results  Component Value Date   HGBA1C 8.2 (H) 01/28/2017   HGBA1C 6.8 03/26/2016   HGBA1C 6.9 06/26/2015    A/P: 1 month follow-up and will repeat A1c at that time

## 2017-05-11 NOTE — Assessment & Plan Note (Signed)
He tells me he has had months of loose stools.  Often times going several times a day.  Last night he got up four times for example.  We will get stool cultures and C. difficile though with this duration do not strongly suspect bacterial cause.  Consider probiotics.

## 2017-05-11 NOTE — Assessment & Plan Note (Signed)
S: Has had significant weight loss due to illness.  Controlled on Coreg 6.25mg  BID, lisinopril 2.5mg  but overcontrolled. Lisinopril started by teaching service while hospitalized for potential renal protective effects.  Stopped during hospitalization- lasix 40mg  BID, hydralazine 25mg  BID, imdur 30mg  XL BP Readings from Last 3 Encounters:  05/11/17 (!) 98/54  04/06/17 (!) 144/56  03/25/17 132/64  A/P: We discussed blood pressure goal of <140/90.  We will stop the lisinopril as I do not think his blood pressure can tolerate this.  Continue Coreg

## 2017-05-11 NOTE — Patient Instructions (Addendum)
Stop lisinopril (to help increase blood pressure)  Use eucerin cream or what you have at home at least twice a day. If need additional cream for itch an use triamcinolone I sent in for you up to 7 days twice a day- have to have at least a week break after that  Thrilled you are alive!   Watch weight daily - if increases 3 lbs in a day or 5 lbs in 3 days please see [place]  Please stop by lab before you go

## 2017-05-11 NOTE — Assessment & Plan Note (Signed)
Lasix 40mg  BID--> off after hospitalization and weight loss.  We discussed home monitoring of weights, edema, symptoms such as shortness of breath

## 2017-05-11 NOTE — Assessment & Plan Note (Signed)
Per Dr. [name] patient was to be continued on dual antiplatelet therapy with aspirin and Plavix long-term.  It appears the teaching service stopped his Plavix due to bleeding risk surrounding surgery with ruptured appendix.  They also remarked that this should be permanently discontinued.  I would only want  remain off of this with Dr. Doug Sou approval-we will recheck through epic

## 2017-05-11 NOTE — Telephone Encounter (Signed)
Needs home physical therapy for 3 times a week for 1 week, and then 2 times a week for 3 weeks.

## 2017-05-12 ENCOUNTER — Telehealth: Payer: Self-pay

## 2017-05-12 LAB — COMPREHENSIVE METABOLIC PANEL
ALT: 17 U/L (ref 0–53)
AST: 18 U/L (ref 0–37)
Albumin: 3.5 g/dL (ref 3.5–5.2)
Alkaline Phosphatase: 110 U/L (ref 39–117)
BILIRUBIN TOTAL: 0.3 mg/dL (ref 0.2–1.2)
BUN: 43 mg/dL — ABNORMAL HIGH (ref 6–23)
CALCIUM: 8.6 mg/dL (ref 8.4–10.5)
CO2: 17 meq/L — AB (ref 19–32)
Chloride: 116 mEq/L — ABNORMAL HIGH (ref 96–112)
Creatinine, Ser: 2.44 mg/dL — ABNORMAL HIGH (ref 0.40–1.50)
GFR: 27.08 mL/min — AB (ref >60.00)
Glucose, Bld: 98 mg/dL (ref 70–99)
POTASSIUM: 4.7 meq/L (ref 3.5–5.1)
Sodium: 141 mEq/L (ref 135–145)
Total Protein: 6.9 g/dL (ref 6.0–8.3)

## 2017-05-12 LAB — CBC WITH DIFFERENTIAL/PLATELET
BASOS ABS: 0.1 10*3/uL (ref 0.0–0.1)
BASOS PCT: 1.2 % (ref 0.0–3.0)
EOS PCT: 5.6 % — AB (ref 0.0–5.0)
Eosinophils Absolute: 0.5 10*3/uL (ref 0.0–0.7)
HEMATOCRIT: 30.4 % — AB (ref 39.0–52.0)
Hemoglobin: 9.9 g/dL — ABNORMAL LOW (ref 13.0–17.0)
LYMPHS ABS: 3.1 10*3/uL (ref 0.7–4.0)
LYMPHS PCT: 33.6 % (ref 12.0–46.0)
MCHC: 32.5 g/dL (ref 30.0–36.0)
MCV: 92.7 fl (ref 78.0–100.0)
MONOS PCT: 9.5 % (ref 3.0–12.0)
Monocytes Absolute: 0.9 10*3/uL (ref 0.1–1.0)
NEUTROS ABS: 4.6 10*3/uL (ref 1.4–7.7)
NEUTROS PCT: 50.1 % (ref 43.0–77.0)
PLATELETS: 275 10*3/uL (ref 150.0–400.0)
RBC: 3.28 Mil/uL — ABNORMAL LOW (ref 4.22–5.81)
RDW: 16.9 % — AB (ref 11.5–15.5)
WBC: 9.1 10*3/uL (ref 4.0–10.5)

## 2017-05-12 NOTE — Telephone Encounter (Signed)
Called patient his phone out of order.Son [name] was called no answer left message to call me back. I need to schedule father appointment with Dr.Jordan.

## 2017-05-13 ENCOUNTER — Other Ambulatory Visit: Payer: PPO

## 2017-05-13 DIAGNOSIS — R197 Diarrhea, unspecified: Secondary | ICD-10-CM | POA: Diagnosis not present

## 2017-05-14 LAB — CLOSTRIDIUM DIFFICILE BY PCR: CDIFFPCR: NOT DETECTED

## 2017-05-17 LAB — STOOL CULTURE
MICRO NUMBER: 81227964
MICRO NUMBER: 81227965
MICRO NUMBER:: 81227966
SHIGA RESULT: NOT DETECTED
SPECIMEN QUALITY: ADEQUATE
SPECIMEN QUALITY: ADEQUATE
SPECIMEN QUALITY:: ADEQUATE

## 2017-05-17 NOTE — Telephone Encounter (Signed)
PCP is taking care of patient.

## 2017-05-19 ENCOUNTER — Telehealth: Payer: Self-pay | Admitting: Family Medicine

## 2017-05-19 NOTE — Telephone Encounter (Signed)
Mertha Finders PT assistanc. Patient fell yesterday.  Feel free to call if needed.  -LL

## 2017-05-20 NOTE — Telephone Encounter (Signed)
Any injury? Hit head? hows he doing?

## 2017-05-20 NOTE — Telephone Encounter (Signed)
I agree with walker

## 2017-05-20 NOTE — Telephone Encounter (Signed)
Called and confirmed that there was no injuries noted. PT did recommend that he use the 2 wheel walker versus the cane as they don't feel he is steady enough with just the cane.

## 2017-05-21 ENCOUNTER — Encounter: Payer: Self-pay | Admitting: Family Medicine

## 2017-05-24 ENCOUNTER — Other Ambulatory Visit: Payer: Self-pay

## 2017-05-24 MED ORDER — GABAPENTIN 100 MG PO CAPS: 100.0000 mg | ORAL_CAPSULE | Freq: Every day | ORAL | 1 refills | Status: DC

## 2017-05-24 NOTE — Telephone Encounter (Signed)
Patient cancelled come health for today to run errands with his son. Next home health visit on Thursday 11/15.  Ty,  -LL

## 2017-05-25 NOTE — Telephone Encounter (Signed)
noted 

## 2017-05-26 ENCOUNTER — Encounter: Payer: Self-pay | Admitting: *Deleted

## 2017-05-26 ENCOUNTER — Other Ambulatory Visit: Payer: Self-pay | Admitting: *Deleted

## 2017-05-26 NOTE — Patient Outreach (Signed)
[facility]) Care Management  05/26/2017  Rodney Farley Oct 31, 1931 [id number]    No response to outreach letter and calls to this pt. Case will be closed and primary notified.  Raina Mina, RN Care Management Coordinator [place] Office [phone]

## 2017-05-28 ENCOUNTER — Telehealth: Payer: Self-pay | Admitting: Family Medicine

## 2017-05-28 NOTE — Telephone Encounter (Signed)
Please advise 

## 2017-05-28 NOTE — Telephone Encounter (Signed)
#   not working. Called emergency contact- got daughter in law. They see him daily- she will have him start lisinopril 2.5mg - I would like them to give me an update by Wednesday morning so we can decide if further changes need to be made before holiday weekend next week

## 2017-05-28 NOTE — Telephone Encounter (Signed)
[facility] home [facility] called in reference to patient's BP having been consistently elevated. [facility] stated the average BP for patient is 170/80 for the past week. [facility] will be faxing over info on this as well. [facility] stated they are looking to have BP medications adjusted. Please advise.

## 2017-05-31 ENCOUNTER — Other Ambulatory Visit: Payer: Self-pay | Admitting: *Deleted

## 2017-05-31 NOTE — Patient Outreach (Signed)
[facility]) Care Management  05/31/2017  Rodney Farley 1931/12/25 [id number]    RN Received a message that pt contacted the nurse's hotline. Information provided indicated pt's BS dropped to 59. Note this RN attempted to contact this pt on several outreach calls however unsuccessful even with an Economist. Note number noted in system incorrectly. RN inquired further after identifiers confirmed and introduced the [facility] services and purpose for today's call. Pt very appreciative and indicated his BS on the second finger stick was up to 101 on the day of the call. Pt states he is doing much better not and has no needs to address. RN inquired further concerning the [facility] program and services and the purpose for case management services. RN offered further engagement however pt opt to decline all of [facility] services at this time indicating he is doing "just fine". Pt aware he can outreach the [facility] in the future if needed and the call ended. Note a letter has recently be sent to pt's PCP due to the unsuccessful call earlier this month. Pt is aware when he contacts the [facility] nurse's hotline this RN will received the notice and reach out once again to inquired on the issues. Case will remain closed and CMA will be notified once again.  Raina Mina, RN Care Management Coordinator [place] Office [phone]

## 2017-06-02 ENCOUNTER — Encounter: Payer: Self-pay | Admitting: Physician Assistant

## 2017-06-02 ENCOUNTER — Ambulatory Visit: Payer: PPO | Admitting: Physician Assistant

## 2017-06-02 VITALS — BP 174/73 | HR 76 | Ht 67.0 in | Wt 212.0 lb

## 2017-06-02 DIAGNOSIS — E119 Type 2 diabetes mellitus without complications: Secondary | ICD-10-CM | POA: Diagnosis not present

## 2017-06-02 DIAGNOSIS — I2510 Atherosclerotic heart disease of native coronary artery without angina pectoris: Secondary | ICD-10-CM

## 2017-06-02 DIAGNOSIS — Z950 Presence of cardiac pacemaker: Secondary | ICD-10-CM

## 2017-06-02 DIAGNOSIS — Z794 Long term (current) use of insulin: Secondary | ICD-10-CM | POA: Diagnosis not present

## 2017-06-02 DIAGNOSIS — I10 Essential (primary) hypertension: Secondary | ICD-10-CM | POA: Diagnosis not present

## 2017-06-02 DIAGNOSIS — N184 Chronic kidney disease, stage 4 (severe): Secondary | ICD-10-CM

## 2017-06-02 DIAGNOSIS — E785 Hyperlipidemia, unspecified: Secondary | ICD-10-CM

## 2017-06-02 MED ORDER — FUROSEMIDE 40 MG PO TABS: 40.0000 mg | ORAL_TABLET | ORAL | 3 refills | Status: DC | PRN

## 2017-06-02 NOTE — Patient Instructions (Addendum)
Medication Instructions:  START Lasix take 40 mg tablet as needed for swelling   Labwork: At your next appointment with your pcp to have a fasting Lipid  Testing/Procedures: None    Follow-Up: Your physician recommends that you schedule a follow-up appointment in: 3-6 months with Dr [name]  Your physician wants you to follow-up in: Dr Caryl Comes in 5-6 months. You will receive a reminder letter in the mail two months in advance. If you don't receive a letter, please call our office to schedule the follow-up appointment.  Any Other Special Instructions Will Be Listed Below (If Applicable). Elevate your legs at night and when your watching tv  If you need a refill on your cardiac medications before your next appointment, please call your pharmacy.

## 2017-06-02 NOTE — Progress Notes (Signed)
Cardiology Office Note    Date:  06/04/2017   ID:  Korver, Graybeal 01-19-1932, MRN [id number]  PCP:  [name], MD  Cardiologist:  Dr. [name] Electrophysiologist: Dr. Caryl Comes  Chief Complaint  Patient presents with  . Follow-up    seen for Dr. [name]  . Shortness of Breath  . Edema    Feet.    History of Present Illness:  Rodney Farley is a 83 y.o. male with PMH of CAD (s/p MI in 1994/1995 while in [place] s/p questionable PCI), CHB 2010 s/p PPM, CKD stage IV, HTN, HLD, DM, remote tobacco abuse, COPD, moderate sleep apnea and chronic systolic HF. 2-D echocardiogram obtained in March 2016 showed EF 35-40% with wall motion abnormality, mild MR, grade 1 DD. PFT was normal. He underwent cardiac catheterization on 03/28/2015 which showed severe three-vessel disease, possible treatment including both PCI and CABG, after discussing with the patient, he eventually opted for PCI. He underwent multivessel PCI including stenting of mid LAD, second diagonal, third obtuse marginal branch with drug-eluting stent on 04/09/2015. There was a 70% distal left circumflex lesion that was treated medically. Despite PCI, follow-up echocardiogram continued to show EF depressed at 25-30%. He underwent upgrade of PPM to BiV ICD by Dr. Caryl Comes on 09/23/2015. Follow-up echocardiogram in July 2017 showed normalization of EF. He was last seen by Dr. [name] on 04/20/2016, he was doing well at the time. He did have shingles outbreak that involved his left eye.  He had a prolonged hospitalization from 9/14-9/25/2018 when he presented to the hospital with right lower quadrant abdominal pain and found to have a ruptured appendicitis with abscess. Gen. Surgery was consulted and performed exploratory laparotomy with ileocecectomy on 03/27/2017. He received 10 days of antibiotics afterward. During the hospitalization, he also had acute on chronic renal insufficiency with peak creatinine of 5.75. However recent  lab his creatinine o baseline of 2.44.  Patient has been recovering well after the recent hospitalization. He was seen by his PCP recently who noted his blood pressure was low at 98/54, blood pressure medication and a diuretic was removed, however during today's visit, his blood pressure is elevated at 174/73. He says his primary care provider has restarted some the blood pressure medications. He does appears to have at least 1+ pitting edema in bilateral lower extremity, but he says that's his norm, I will add a as needed dose of Lasix. He does have some shortness of breath at baseline, however this has not changed recently. He denies any chest pain, orthopnea or PND.   Past Medical History:  Diagnosis Date  . Anginal pain ([place])   . Anxiety   . Arm pain 05/08/2015   LEFT ARM  . Atrioventricular block, complete ([place])    a. 2010 s/p pacemaker.  . Bell's palsy    left side. after shingles episode  . Chronic systolic CHF (congestive heart failure) ([place])    a. LVEF 35-40% by echo 09/2014.  [name] CKD (chronic kidney disease), stage IV ([place])   . COPD (chronic obstructive pulmonary disease) ([facility])    Severe  . Coronary artery disease    a. s/p MI in 1994/1995 while in [place] s/p questionable PCI. 03/2015: progression of disease, for staged PCI.  [name] Depression   . Diabetic peripheral neuropathy ([place])   . DOE (dyspnea on exertion) 03/28/2015  . GERD (gastroesophageal reflux disease)   . Hypercholesterolemia   . Hypertension   . MI (myocardial infarction) ([place]) 1994; 1995  .  Neuropathy    IN LOWER EXTREMITIES  . Obesity   . Pacemaker    medtronic>>> MDT ICD 09/23/15  . Sleep apnea    "sleeps w/humidifyer when he panics and gets short of breath" (04/08/2015)  . TIA (transient ischemic attack) X 3  . Type II diabetes mellitus ([place])     Past Surgical History:  Procedure Laterality Date  . CARDIAC CATHETERIZATION N/A 03/29/2015   Procedure: Right/Left Heart Cath and Coronary Angiography;  Surgeon:  Jerrin M [name], MD;  Location: [facility];  Service: Cardiovascular;  Laterality: N/A;  . [place]   "after my MI; put me on heart RX after cath"  . CARDIAC CATHETERIZATION N/A 04/09/2015   Procedure: Coronary Stent Intervention;  Surgeon: Berkley M [name], MD;  Location: [facility];  Service: Cardiovascular;  Laterality: N/A;  . CHOLECYSTECTOMY OPEN  ~ 1977  . EP IMPLANTABLE DEVICE N/A 09/23/2015   MDT CRT-D, Dr. Caryl Comes  . HIATAL HERNIA REPAIR  1977  . ILEOCECETOMY N/A 03/27/2017   Procedure: ILEOCECECTOMY;  Surgeon: Ileana Roup, MD;  Location: [name];  Service: General;  Laterality: N/A;  . INSERT / REPLACE / REMOVE PACEMAKER  07/2008   Complete heart block status post DDD with good function  . LAPAROTOMY N/A 03/27/2017   Procedure: EXPLORATORY LAPAROTOMY;  Surgeon: Ileana Roup, MD;  Location: [facility];  Service: General;  Laterality: N/A;  . TONSILLECTOMY      Current Medications: Outpatient Medications Prior to Visit  Medication Sig Dispense Refill  . aspirin EC 81 MG tablet Take 81 mg by mouth daily.    [name] atorvastatin (LIPITOR) 40 MG tablet Take 1 tablet (40 mg total) by mouth daily. (Patient taking differently: Take 40 mg by mouth at bedtime. ) 90 tablet 3  . Blood Glucose Monitoring Suppl (FREESTYLE LITE) DEVI     . carvedilol (COREG) 6.25 MG tablet Take 1 tablet (6.25 mg total) by mouth 2 (two) times daily with a meal. 60 tablet 0  . gabapentin (NEURONTIN) 100 MG capsule Take 1 capsule (100 mg total) at bedtime by mouth. 90 capsule 1  . glucose blood (FREESTYLE TEST STRIPS) test strip Use as instructed 100 each 12  . glucose monitoring kit (FREESTYLE) monitoring kit USE TO MONITOR BLOOD GLUCOSE AS DIRECTED 1 each 1  . insulin aspart (NOVOLOG) 100 UNIT/ML injection Inject 0-5 Units into the skin at bedtime. 10 mL 11  . insulin aspart (NOVOLOG) 100 UNIT/ML injection Inject 0-9 Units into the skin 3 (three) times daily with meals. 10 mL 11    . Insulin Pen Needle (UNIFINE PENTIPS) 31G X 5 MM MISC USE AS DIRECTED 100 each 0  . nitroGLYCERIN (NITROSTAT) 0.4 MG SL tablet Place 1 tablet (0.4 mg total) under the tongue every 5 (five) minutes as needed for chest pain. 2 doses before calling for emergent help 25 tablet 3  . pantoprazole (PROTONIX) 40 MG tablet TAKE 1 TABLET BY MOUTH ONCE DAILY (Patient taking differently: TAKE 1 TABLET (40 MG) BY MOUTH ONCE DAILY) 90 tablet 3  . polyethylene glycol (MIRALAX / GLYCOLAX) packet Take 17 g by mouth daily. 14 each 0  . Polyethylene Glycol 400 (BLINK TEARS) 0.25 % SOLN Place 1 drop into both eyes 3 (three) times daily.    . sertraline (ZOLOFT) 100 MG tablet Take 1.5 tablets (150 mg total) by mouth daily. 135 tablet 1  . tamsulosin (FLOMAX) 0.4 MG CAPS capsule TAKE 1 CAPSULE BY MOUTH DAILY  AFTER SUPPER (Patient taking differently: Take 0.4 mg by mouth daily after supper. ) 90 capsule 3  . triamcinolone cream (KENALOG) 0.1 % Apply 1 application topically 2 (two) times daily. For 7-10 days maximum 80 g 0  . vitamin B-12 (CYANOCOBALAMIN) 1000 MCG tablet Take 1,000 mcg by mouth daily.    [name] acetaminophen (TYLENOL) 325 MG tablet Take 2 tablets (650 mg total) by mouth every 6 (six) hours. 120 tablet 0   No facility-administered medications prior to visit.      Allergies:   Bee venom; Lyrica [pregabalin]; Prednisone; and Zocor [simvastatin]   Social History   Socioeconomic History  . Marital status: Married    Spouse name: None  . Number of children: 1  . Years of education: college  . Highest education level: None  Social Needs  . Financial resource strain: None  . Food insecurity - worry: None  . Food insecurity - inability: None  . Transportation needs - medical: None  . Transportation needs - non-medical: None  Occupational History  . Occupation: Retired  Tobacco Use  . Smoking status: Former Smoker    Packs/day: 1.50    Years: 54.00    Pack years: 81.00    Types: Cigarettes    Last  attempt to quit: 07/18/2007    Years since quitting: 9.8  . Smokeless tobacco: Never Used  Substance and Sexual Activity  . Alcohol use: No    Comment: 04/08/2015 "probably 3 drinks/month"  . Drug use: No  . Sexual activity: No  Other Topics Concern  . None  Social History Narrative   Married 1994 (together since 1989) 1 son, 1 stepson. 3 grandkids, 6 great grandkids      Retired from [employer]. Had 2 years of collge.       Faith: Mormon     Family History:  [name] family history includes Heart attack in his brother; Leukemia in his father; Stroke in his mother and sister.   ROS:   Please see the history of present illness.    ROS All other systems reviewed and are negative.   PHYSICAL EXAM:   VS:  BP (!) 174/73   Pulse 76   Ht _0  (1.702 m)   Wt 212 lb (96.2 kg)   BMI 33.20 kg/m    GEN: Well nourished, well developed, in no acute distress  HEENT: normal  Neck: no JVD, carotid bruits, or masses Cardiac: RRR; no murmurs, rubs, or gallops,no edema  Respiratory:  clear to auscultation bilaterally, normal work of breathing GI: soft, nontender, nondistended, + BS MS: no deformity or atrophy  Skin: warm and dry, no rash Neuro:  Alert and Oriented x 3, Strength and sensation are intact Psych: euthymic mood, full affect  Wt Readings from Last 3 Encounters:  06/02/17 212 lb (96.2 kg)  05/11/17 209 lb 12.8 oz (95.2 kg)  04/05/17 214 lb 3.2 oz (97.2 kg)      Studies/Labs Reviewed:   EKG:  EKG is not ordered today.   Recent Labs: 01/28/2017: TSH 1.41 03/30/2017: Magnesium 2.3 05/11/2017: ALT 17; BUN 43; Creatinine, Ser 2.44; Hemoglobin 9.9; Platelets 275.0; Potassium 4.7; Sodium 141   Lipid Panel    Component Value Date/Time   CHOL 171 06/26/2015 1103   TRIG 317 (H) 06/26/2015 1103   HDL 43 06/26/2015 1103   CHOLHDL 4.0 06/26/2015 1103   VLDL 63 (H) 06/26/2015 1103   LDLCALC 65 06/26/2015 1103    Additional studies/ records that were reviewed  today  include:    Cath 04/09/2015 Conclusion    Acute Mrg lesion, 100% stenosed.  Ost 1st Diag to 1st Diag lesion, 90% stenosed.  Dist Cx lesion, 70% stenosed.  2nd Diag lesion, 90% stenosed. There is a 0% residual stenosis post intervention.  A drug-eluting stent was placed.  Mid LAD lesion, 90% stenosed. There is a 0% residual stenosis post intervention.  A drug-eluting stent was placed.  3rd Mrg lesion, 90% stenosed. There is a 0% residual stenosis post intervention.  A drug-eluting stent was placed.   1. Successful stenting of the mid LAD, second diagonal, and OM3 with DES.  Plan: observe overnight with hydration. Assess renal function in AM. Plan to resume lasix at DC due to elevated EDP. DAPT for one year. Anticipate DC in am if no complications. Will need to reassess LV function in 3 months. If EF remains low consider upgrading pacemaker to a CRT/ICD device.        Echo 01/23/2016 LV EF: 60% -   65%  Study Conclusions  - Left ventricle: The cavity size was normal. Wall thickness was   increased in a pattern of mild LVH. Systolic function was normal.   The estimated ejection fraction was in the range of 60% to 65%.   Doppler parameters are consistent with abnormal left ventricular   relaxation (grade 1 diastolic dysfunction).      ASSESSMENT:    1. Coronary artery disease involving native coronary artery of native heart without angina pectoris   2. Pacemaker   3. CKD (chronic kidney disease), stage IV ([place])   4. Essential hypertension   5. Hyperlipidemia, unspecified hyperlipidemia type   6. Controlled type 2 diabetes mellitus without complication, with long-term current use of insulin (H[facility])      PLAN:  In order of problems listed above:  1. CAD: Denies any significant chest discomfort or shortness breath.  2. CHB s/p PPM: Followed by Dr. Caryl Comes.  3. CKD stage IV: Recently had acute on chronic renal insufficiency during admission for ruptured  appendicitis. This has improved   4. Hypertension: Blood pressure elevated, previously had low blood pressure, however after holding the blood pressure medication his blood pressure rebounded. His primary care physician is restarting some his previous blood pressure medication. Will defer management of low pressure to his primary care provider.  5. Hyperlipidemia: Continue Lipitor 40 mg daily.  6. DM 2: On insulin managed by primary care provider    Medication Adjustments/Labs and Tests Ordered: Current medicines are reviewed at length with the patient today.  Concerns regarding medicines are outlined above.  Medication changes, Labs and Tests ordered today are listed in the Patient Instructions below. Patient Instructions  Medication Instructions:  START Lasix take 40 mg tablet as needed for swelling   Labwork: At your next appointment with your pcp to have a fasting Lipid  Testing/Procedures: None    Follow-Up: Your physician recommends that you schedule a follow-up appointment in: 3-6 months with Dr [name]  Your physician wants you to follow-up in: Dr Caryl Comes in 5-6 months. You will receive a reminder letter in the mail two months in advance. If you don't receive a letter, please call our office to schedule the follow-up appointment.  Any Other Special Instructions Will Be Listed Below (If Applicable). Elevate your legs at night and when your watching tv  If you need a refill on your cardiac medications before your next appointment, please call your pharmacy.     Signed, Almyra Deforest,  PA  06/04/2017 3:14 PM    [facility] [address], [place], [place]  [address] Phone: [phone]; Fax: [phone]

## 2017-06-02 NOTE — Telephone Encounter (Signed)
Sounds reasonable- with office closed may not see readings until monday

## 2017-06-02 NOTE — Telephone Encounter (Signed)
Left VM requesting return call back.

## 2017-06-02 NOTE — Telephone Encounter (Signed)
Patient returned call and states that his BP readings have been much better but he cannot recall the exact numbers (Believes they are around 150/80). I asked him to write them down as he is checking them and to sent a Mychart messaged to Dr Yong Channel on Friday with those readings. Pt stated understanding.

## 2017-06-04 ENCOUNTER — Encounter: Payer: Self-pay | Admitting: Physician Assistant

## 2017-06-07 ENCOUNTER — Telehealth: Payer: Self-pay | Admitting: Family Medicine

## 2017-06-07 NOTE — Telephone Encounter (Signed)
[facility] calling to get verbal orders for patient for PT 2x a week for 4 weeks. Please call and advise.

## 2017-06-08 ENCOUNTER — Encounter: Payer: PPO | Admitting: *Deleted

## 2017-06-08 ENCOUNTER — Telehealth: Payer: Self-pay | Admitting: Cardiology

## 2017-06-08 NOTE — Telephone Encounter (Signed)
Called and provided verbal order as requested 

## 2017-06-08 NOTE — Telephone Encounter (Addendum)
Attempted to confirm remote transmission with pt. No answer and was unable to leave a message.   

## 2017-06-09 ENCOUNTER — Telehealth: Payer: Self-pay | Admitting: Family Medicine

## 2017-06-09 ENCOUNTER — Telehealth: Payer: Self-pay

## 2017-06-09 MED ORDER — CARVEDILOL 6.25 MG PO TABS: 6.2500 mg | ORAL_TABLET | Freq: Two times a day (BID) | ORAL | 2 refills | Status: DC

## 2017-06-09 NOTE — Telephone Encounter (Signed)
Have him come see [place] if recurrent falls or symptoms from fall like headache

## 2017-06-09 NOTE — Telephone Encounter (Unsigned)
Copied from [place] [phone]. Topic: General - Other >> Jun 09, 2017 10:50 AM Neva Seat wrote: [facility] [phone] called to report to Dr. Yong Channel pt had a fall on the Nov. 22.  He wasn't hurt.

## 2017-06-09 NOTE — Telephone Encounter (Signed)
Pt called and notified that medication was sent to [facility] as requested. No further concerns voiced at this time.

## 2017-06-09 NOTE — Telephone Encounter (Signed)
Copied from [place] [phone]. Topic: Quick Communication - Rx Refill/Question >> Jun 09, 2017  3:33 PM Scherrie Gerlach wrote: Has the patient contacted their pharmacy? no Colletta Maryland with [facility] called to request refill for pt of carvedilol (COREG) 6.25 MG tablet   [place], [place] [phone] (Phone) [phone] (Fax)   A

## 2017-06-09 NOTE — Telephone Encounter (Signed)
Copied from [place]. Topic: General - Other >> Jun 09, 2017  1:44 PM Lennox Solders wrote: Reason for CRM:  Colletta Maryland lpn from [facility] is calling pt miss his skill nursing visit for wound care on 06-07-17.

## 2017-06-10 ENCOUNTER — Encounter: Payer: Self-pay | Admitting: Cardiology

## 2017-06-11 NOTE — Telephone Encounter (Signed)
FYI, see message. 

## 2017-06-11 NOTE — Telephone Encounter (Signed)
Left message on voicemail to call office.  

## 2017-06-13 ENCOUNTER — Encounter: Payer: Self-pay | Admitting: Family Medicine

## 2017-06-14 ENCOUNTER — Telehealth: Payer: Self-pay

## 2017-06-14 ENCOUNTER — Other Ambulatory Visit: Payer: Self-pay

## 2017-06-14 MED ORDER — CARVEDILOL 6.25 MG PO TABS: 6.2500 mg | ORAL_TABLET | Freq: Two times a day (BID) | ORAL | 5 refills | Status: DC

## 2017-06-14 MED ORDER — ATORVASTATIN CALCIUM 40 MG PO TABS: 40.0000 mg | ORAL_TABLET | Freq: Every day | ORAL | 3 refills | Status: DC

## 2017-06-14 NOTE — Telephone Encounter (Signed)
Copied from [place] [phone]. Topic: Quick Communication - Office Called Patient >> Jun 11, 2017  3:35 PM Marian Sorrow, LPN wrote: [facility] and left message for Lysbeth Galas to call office regarding patient. Please give message from Dr. Yong Channel.  >> Jun 14, 2017  1:13 PM Tye Maryland wrote: Jackelyn Poling from [facility] wanted to report another fall yesterday @ 6:30pm, he was sitting in office chair fell asleep and fell out banged right side of forehead, was able to get himself off the floor using the technique shown by his PTA, currently being educated on fall prevention and awareness

## 2017-06-14 NOTE — Telephone Encounter (Signed)
Thanks for update. Amber if he develops symptoms such as headache, blurry vision, etc from this or other new symptoms please have him be seen asap

## 2017-06-15 ENCOUNTER — Telehealth: Payer: Self-pay | Admitting: Family Medicine

## 2017-06-15 NOTE — Telephone Encounter (Signed)
Noted.Will do

## 2017-06-15 NOTE — Telephone Encounter (Signed)
Copied from [place]. Topic: Quick Communication - See Telephone Encounter >> Jun 15, 2017 10:14 AM Synthia Innocent wrote: CRM for notification. See Telephone encounter for:  Would like to speak to nurse regarding plan for BP 06/15/17.

## 2017-06-15 NOTE — Telephone Encounter (Signed)
I returned a call to Sonia Baller the nurse with [facility].  She is the one that monitors him via telehealth.  He has been running higher than normal BPs:  Today it's 163/71.  Yesterday it was 176/70.   It has been trending 170s/70's.   He has also gained 7 lbs in 10 days.  He does have an extensive cardiac history including heart failure. I routed a high priority note to Dr. Ansel Bong nurse pool regarding the above.

## 2017-06-16 NOTE — Telephone Encounter (Signed)
Patient needs to be seen ASAP with me or another provider within [facility]- concern for fluid overload. Just getting message as of 1 PM today. Please lets expedite him being seen- at absolute latest needs to see me tomorrow but if he can be worked in somewhere else that is also reasonable.

## 2017-06-17 NOTE — Telephone Encounter (Signed)
If [facility] is sending high priority messages, would greatly appreciate if they could call patient's back= particularly on our nurse's half day. If not- Amber needs to be covering boxes for high priority [facility] messages when Roselyn Reef is out.

## 2017-06-17 NOTE — Telephone Encounter (Signed)
Called and spoke to daughter in law [name] who is bringing him in tomorrow as I scheduled him for a follow up 06/18/17 @ 3:00pm. She did state they had trouble getting [facility] to fill one of his medications he takes twice a day. They were only able to get it a couple of days ago but since they have she feels his BP has been better and he has lost a couple of pounds.

## 2017-06-18 ENCOUNTER — Encounter: Payer: Self-pay | Admitting: Family Medicine

## 2017-06-18 ENCOUNTER — Ambulatory Visit (INDEPENDENT_AMBULATORY_CARE_PROVIDER_SITE_OTHER): Payer: PPO | Admitting: Family Medicine

## 2017-06-18 VITALS — BP 134/68 | HR 71 | Temp 98.3°F | Ht 67.0 in | Wt 213.2 lb

## 2017-06-18 DIAGNOSIS — E1122 Type 2 diabetes mellitus with diabetic chronic kidney disease: Secondary | ICD-10-CM | POA: Diagnosis not present

## 2017-06-18 DIAGNOSIS — Z794 Long term (current) use of insulin: Secondary | ICD-10-CM | POA: Diagnosis not present

## 2017-06-18 DIAGNOSIS — E1165 Type 2 diabetes mellitus with hyperglycemia: Secondary | ICD-10-CM

## 2017-06-18 DIAGNOSIS — IMO0002 Reserved for concepts with insufficient information to code with codable children: Secondary | ICD-10-CM

## 2017-06-18 DIAGNOSIS — N183 Chronic kidney disease, stage 3 (moderate): Secondary | ICD-10-CM | POA: Diagnosis not present

## 2017-06-18 DIAGNOSIS — I10 Essential (primary) hypertension: Secondary | ICD-10-CM

## 2017-06-18 DIAGNOSIS — I5022 Chronic systolic (congestive) heart failure: Secondary | ICD-10-CM

## 2017-06-18 MED ORDER — LISINOPRIL 2.5 MG PO TABS: 2.5000 mg | ORAL_TABLET | Freq: Every day | ORAL | 3 refills | Status: DC

## 2017-06-18 MED ORDER — GLUCOSE BLOOD VI STRP: ORAL_STRIP | 3 refills | Status: DC

## 2017-06-18 NOTE — Patient Instructions (Addendum)
Please stop by lab before you go  Blood pressure looks much better. Continue coreg 6.25mg  twice a day, lisinopril 2.5mg  once a day.   Start lasix 1 tablet every other day  Let me know immediately if you have 3 lbs weight gain in 1 day or 5 lbs over 3-5 days.   Lets follow up in 4-6 weeks. Go ahead and schedule

## 2017-06-18 NOTE — Progress Notes (Signed)
Subjective:  Rodney Farley is a 83 y.o. year old very pleasant male patient who presents for/with See problem oriented charting ROS- denies shortness of breath after taking lasix today, edema slightly improved from yesterday. No chest pain. Blood pressures improved at home   Past Medical History-  Patient Active Problem List   Diagnosis Date Noted  . Ruptured appendicitis 03/26/2017    Priority: High  . COPD (chronic obstructive pulmonary disease) ([place])     Priority: High  . Chronic systolic CHF (congestive heart failure) ([place]) 04/08/2015    Priority: High  . DOE (dyspnea on exertion), due to significant CAD 03/28/2015    Priority: High  . Chronic kidney disease (CKD), stage IV (severe) ([place]) 03/04/2015    Priority: High  . Coronary artery disease involving native coronary artery of native heart with angina pectoris ([place]) 09/13/2014    Priority: High  . Depression 08/05/2012    Priority: High  . Uncontrolled type 2 diabetes mellitus with stage 3 chronic kidney disease, with long-term current use of insulin ([place]) 08/05/2012    Priority: High  . Ischemic cardiomyopathy 11/03/2011    Priority: High  . Atrioventricular block, complete (HCC)     Priority: High  . Pacemaker     Priority: High  . BPH associated with nocturia 04/27/2016    Priority: Medium  . Diabetic peripheral neuropathy (HCC)     Priority: Medium  . Bell's palsy     Priority: Medium  . Gout 06/26/2015    Priority: Medium  . Sleep apnea 08/05/2012    Priority: Medium  . HTN (hypertension) 08/05/2012    Priority: Medium  . Diverticulitis 04/08/2015    Priority: Low  . GERD (gastroesophageal reflux disease) 08/07/2012    Priority: Low  . Obesity     Priority: Low  . Diarrhea 05/11/2017  . Marital stress 02/18/2017  . Trigger finger 06/10/2016    Medications- reviewed and updated Current Outpatient Medications  Medication Sig Dispense Refill  . aspirin EC 81 MG tablet Take 81 mg by mouth daily.     [name] atorvastatin (LIPITOR) 40 MG tablet Take 1 tablet (40 mg total) by mouth daily. 90 tablet 3  . Blood Glucose Monitoring Suppl (FREESTYLE LITE) DEVI     . carvedilol (COREG) 6.25 MG tablet Take 1 tablet (6.25 mg total) by mouth 2 (two) times daily with a meal. 60 tablet 5  . furosemide (LASIX) 40 MG tablet Take 1 tablet (40 mg total) by mouth as needed (take as needed for swelling). 30 tablet 3  . gabapentin (NEURONTIN) 100 MG capsule Take 1 capsule (100 mg total) at bedtime by mouth. 90 capsule 1  . glucose blood (FREESTYLE TEST STRIPS) test strip Use to check blood sugar daily 100 each 3  . glucose monitoring kit (FREESTYLE) monitoring kit USE TO MONITOR BLOOD GLUCOSE AS DIRECTED 1 each 1  . insulin aspart (NOVOLOG) 100 UNIT/ML injection Inject 0-5 Units into the skin at bedtime. 10 mL 11  . insulin aspart (NOVOLOG) 100 UNIT/ML injection Inject 0-9 Units into the skin 3 (three) times daily with meals. 10 mL 11  . Insulin Pen Needle (UNIFINE PENTIPS) 31G X 5 MM MISC USE AS DIRECTED 100 each 0  . lisinopril (PRINIVIL,ZESTRIL) 2.5 MG tablet Take 2.5 mg by mouth daily.    . nitroGLYCERIN (NITROSTAT) 0.4 MG SL tablet Place 1 tablet (0.4 mg total) under the tongue every 5 (five) minutes as needed for chest pain. 2 doses before calling for emergent  help 25 tablet 3  . pantoprazole (PROTONIX) 40 MG tablet TAKE 1 TABLET BY MOUTH ONCE DAILY (Patient taking differently: TAKE 1 TABLET (40 MG) BY MOUTH ONCE DAILY) 90 tablet 3  . polyethylene glycol (MIRALAX / GLYCOLAX) packet Take 17 g by mouth daily. 14 each 0  . Polyethylene Glycol 400 (BLINK TEARS) 0.25 % SOLN Place 1 drop into both eyes 3 (three) times daily.    . sertraline (ZOLOFT) 100 MG tablet Take 1.5 tablets (150 mg total) by mouth daily. 135 tablet 1  . tamsulosin (FLOMAX) 0.4 MG CAPS capsule TAKE 1 CAPSULE BY MOUTH DAILY AFTER SUPPER (Patient taking differently: Take 0.4 mg by mouth daily after supper. ) 90 capsule 3  . triamcinolone cream  (KENALOG) 0.1 % Apply 1 application topically 2 (two) times daily. For 7-10 days maximum 80 g 0  . vitamin B-12 (CYANOCOBALAMIN) 1000 MCG tablet Take 1,000 mcg by mouth daily.     Objective: BP 134/68 (BP Location: Left Arm, Patient Position: Sitting, Cuff Size: Large)   Pulse 71   Temp 98.3 F (36.8 C) (Oral)   Ht '5\' 7"'  (1.702 m)   Wt 213 lb 3.2 oz (96.7 kg)   SpO2 100%   BMI 33.39 kg/m  Gen: NAD, resting comfortably CV: RRR no murmurs rubs or gallops Lungs: CTAB no crackles, wheeze, rhonchi Abdomen: soft/nontender/nondistended/normal bowel sounds. No rebound or guarding.  Ext: at least 1+ edema Skin: warm, dry  Assessment/Plan:  HTN (hypertension) S: controlled on coreg 6.39m BID, lisinopril 2.561mas well as 1x lasix dose this AM. He had failed to fill the rx for 4 days of carvedilol and BP started running up thus prompting follow up here per home health  Off lasix 4052mID, hydralazine 25 BID, imdur. Cardiology restarted prn but he states he is not really sure when to take it though did take it this morning BP Readings from Last 3 Encounters:  06/18/17 134/68  06/02/17 (!) 174/73  05/11/17 (!) 98/54  A/P: We discussed blood pressure goal of <140/90. Continue current meds:  But change lasix instead of prn to every other day to see if we can help moderate fluid status. Agrees to 6 week follow up- get bmet at that time  Chronic systolic CHF (congestive heart failure) (HCCBennettsvilleiscussed precautions of when he needs to seek care including 3 lbs up in a day or 5 lbs in 3-5 days, increasing edema or new shortness of breath. Today weight is up 4 lbs from last visit but htat is after a lasix this AM.   We will change lasix to every other day as he is struggling with the concept of prn dose. If he meets criteria above- he will contact office immediately or at least is instructed to do so  Uncontrolled type 2 diabetes mellitus with stage 3 chronic kidney disease, with long-term current use  of insulin (HCCMelody Hillpdated a1c shows drastically improved control Lab Results  Component Value Date   HGBA1C 5.6 06/18/2017  continue lasix 60 units daily  Will have Jamie reach out to patient- med list has novolog listed TID and at bedtime but patient had previously told me lantus 60 units daily- will need to clarify and have her update med list   Future Appointments  Date Time Provider DepLaporte/31/2019  2:45 PM HunMarin OlpD [facility]  09/06/2017 11:00 AM JorMartiniqueeter M, MD [facility]  Procedures  . Basic metabolic panel  .  Hemoglobin A1c    Meds ordered this encounter  Medications  . glucose blood (FREESTYLE TEST STRIPS) test strip    Sig: Use to check blood sugar daily    Dispense:  100 each    Refill:  3    DX: E11.9  . lisinopril (PRINIVIL,ZESTRIL) 2.5 MG tablet    Sig: Take 1 tablet (2.5 mg total) by mouth daily.    Dispense:  90 tablet    Refill:  3  . carvedilol (COREG) 6.25 MG tablet    Sig: Take 1 tablet (6.25 mg total) by mouth 2 (two) times daily with a meal.    Dispense:  180 tablet    Refill:  3    Return precautions advised.  Garret Reddish, MD

## 2017-06-19 LAB — BASIC METABOLIC PANEL
BUN/Creatinine Ratio: 16 (calc) (ref 6–22)
BUN: 39 mg/dL — ABNORMAL HIGH (ref 7–25)
CALCIUM: 8.9 mg/dL (ref 8.6–10.3)
CHLORIDE: 109 mmol/L (ref 98–110)
CO2: 21 mmol/L (ref 20–32)
Creat: 2.45 mg/dL — ABNORMAL HIGH (ref 0.70–1.11)
GLUCOSE: 97 mg/dL (ref 65–99)
Potassium: 4.9 mmol/L (ref 3.5–5.3)
SODIUM: 139 mmol/L (ref 135–146)

## 2017-06-19 LAB — HEMOGLOBIN A1C
EAG (MMOL/L): 6.3 (calc)
HEMOGLOBIN A1C: 5.6 %{Hb} (ref <5.7)
MEAN PLASMA GLUCOSE: 114 (calc)

## 2017-06-19 MED ORDER — CARVEDILOL 6.25 MG PO TABS: 6.2500 mg | ORAL_TABLET | Freq: Two times a day (BID) | ORAL | 3 refills | Status: DC

## 2017-06-19 NOTE — Assessment & Plan Note (Signed)
Updated a1c shows drastically improved control Lab Results  Component Value Date   HGBA1C 5.6 06/18/2017  continue lasix 60 units daily  Will have Jamie reach out to patient- med list has novolog listed TID and at bedtime but patient had previously told me lantus 60 units daily- will need to clarify and have her update med list

## 2017-06-19 NOTE — Assessment & Plan Note (Signed)
S: controlled on coreg 6.25mg  BID, lisinopril 2.5mg  as well as 1x lasix dose this AM. He had failed to fill the rx for 4 days of carvedilol and BP started running up thus prompting follow up here per home health  Off lasix 40mg  BID, hydralazine 25 BID, imdur. Cardiology restarted prn but he states he is not really sure when to take it though did take it this morning BP Readings from Last 3 Encounters:  06/18/17 134/68  06/02/17 (!) 174/73  05/11/17 (!) 98/54  A/P: We discussed blood pressure goal of <140/90. Continue current meds:  But change lasix instead of prn to every other day to see if we can help moderate fluid status. Agrees to 6 week follow up- get bmet at that time

## 2017-06-19 NOTE — Assessment & Plan Note (Signed)
Discussed precautions of when he needs to seek care including 3 lbs up in a day or 5 lbs in 3-5 days, increasing edema or new shortness of breath. Today weight is up 4 lbs from last visit but htat is after a lasix this AM.   We will change lasix to every other day as he is struggling with the concept of prn dose. If he meets criteria above- he will contact office immediately or at least is instructed to do so

## 2017-06-21 ENCOUNTER — Ambulatory Visit (INDEPENDENT_AMBULATORY_CARE_PROVIDER_SITE_OTHER): Payer: PPO | Admitting: *Deleted

## 2017-06-21 DIAGNOSIS — I255 Ischemic cardiomyopathy: Secondary | ICD-10-CM | POA: Diagnosis not present

## 2017-06-22 NOTE — Progress Notes (Signed)
Remote ICD transmission.   

## 2017-06-23 ENCOUNTER — Encounter: Payer: Self-pay | Admitting: Family Medicine

## 2017-06-23 ENCOUNTER — Telehealth: Payer: Self-pay | Admitting: Family Medicine

## 2017-06-23 NOTE — Telephone Encounter (Signed)
Copied from [place]. Topic: Quick Communication - See Telephone Encounter >> Jun 23, 2017  1:41 PM Burnis Medin, NT wrote: CRM for notification. See Telephone encounter for: Lysbeth Galas is calling in from [facility] to report a missed visit for OT and PT. Pt road was not cleared.  06/23/17.

## 2017-06-24 NOTE — Telephone Encounter (Signed)
noted 

## 2017-06-25 ENCOUNTER — Encounter: Payer: Self-pay | Admitting: Cardiology

## 2017-06-28 ENCOUNTER — Telehealth: Payer: Self-pay

## 2017-06-28 NOTE — Telephone Encounter (Signed)
Spoke with patient's son, who was able to obtain the information regarding patient's insulin.  Patient is taking Lantus 60 units once daily at 10 pm.  He is testing his blood sugar every morning when when waking up (before eating) and does not check his sugar again throughout the day.  If he is to stay on that dose of Lantus, he will need a refill because he only has 5 days left.  Please call son to advise.

## 2017-06-30 NOTE — Telephone Encounter (Signed)
Yes thanks, may refill that dose. Make sure to remove medicines he is not on- including short acting insulin if listed since doesn't appear he is on that

## 2017-06-30 NOTE — Telephone Encounter (Signed)
Please advise 

## 2017-07-01 ENCOUNTER — Other Ambulatory Visit: Payer: Self-pay

## 2017-07-01 ENCOUNTER — Other Ambulatory Visit: Payer: Self-pay | Admitting: Family Medicine

## 2017-07-01 MED ORDER — INSULIN GLARGINE 100 UNIT/ML SOLOSTAR PEN: 60.0000 [IU] | PEN_INJECTOR | Freq: Every day | SUBCUTANEOUS | 99 refills | Status: DC

## 2017-07-01 NOTE — Telephone Encounter (Signed)
Lantus was sent to the pharmacy. I spoke with patient's son who is aware. I also scheduled a follow up appointment for patient in January

## 2017-07-02 LAB — CUP PACEART REMOTE DEVICE CHECK
Battery Remaining Longevity: 46 mo
Brady Statistic AP VS Percent: 0.6 %
Brady Statistic AS VP Percent: 32.21 %
Brady Statistic AS VS Percent: 2.85 %
Date Time Interrogation Session: 20181208144334
HighPow Impedance: 61 Ohm
Implantable Lead Implant Date: 20100112
Implantable Lead Implant Date: 20170313
Implantable Lead Location: 753858
Implantable Lead Location: 753860
Implantable Pulse Generator Implant Date: 20170313
Lead Channel Impedance Value: 342 Ohm
Lead Channel Impedance Value: 361 Ohm
Lead Channel Impedance Value: 399 Ohm
Lead Channel Impedance Value: 456 Ohm
Lead Channel Impedance Value: 456 Ohm
Lead Channel Impedance Value: 646 Ohm
Lead Channel Impedance Value: 665 Ohm
Lead Channel Impedance Value: 703 Ohm
Lead Channel Impedance Value: 722 Ohm
Lead Channel Pacing Threshold Amplitude: 1 V
Lead Channel Pacing Threshold Amplitude: 1.375 V
Lead Channel Pacing Threshold Pulse Width: 0.4 ms
Lead Channel Pacing Threshold Pulse Width: 0.4 ms
Lead Channel Sensing Intrinsic Amplitude: 1.375 mV
Lead Channel Setting Pacing Amplitude: 3 V
Lead Channel Setting Pacing Pulse Width: 0.4 ms
Lead Channel Setting Pacing Pulse Width: 0.4 ms
Lead Channel Setting Sensing Sensitivity: 0.3 mV
MDC IDC LEAD IMPLANT DT: 20170313
MDC IDC LEAD LOCATION: 753859
MDC IDC MSMT BATTERY VOLTAGE: 2.96 V
MDC IDC MSMT LEADCHNL LV IMPEDANCE VALUE: 361 Ohm
MDC IDC MSMT LEADCHNL LV IMPEDANCE VALUE: 456 Ohm
MDC IDC MSMT LEADCHNL LV IMPEDANCE VALUE: 608 Ohm
MDC IDC MSMT LEADCHNL RA IMPEDANCE VALUE: 361 Ohm
MDC IDC MSMT LEADCHNL RA SENSING INTR AMPL: 1.375 mV
MDC IDC MSMT LEADCHNL RV PACING THRESHOLD AMPLITUDE: 0.625 V
MDC IDC MSMT LEADCHNL RV PACING THRESHOLD PULSEWIDTH: 0.4 ms
MDC IDC MSMT LEADCHNL RV SENSING INTR AMPL: 13.5 mV
MDC IDC MSMT LEADCHNL RV SENSING INTR AMPL: 13.5 mV
MDC IDC SET LEADCHNL LV PACING AMPLITUDE: 2.5 V
MDC IDC SET LEADCHNL RV PACING AMPLITUDE: 2.5 V
MDC IDC STAT BRADY AP VP PERCENT: 64.34 %
MDC IDC STAT BRADY RA PERCENT PACED: 63.31 %
MDC IDC STAT BRADY RV PERCENT PACED: 95.18 %

## 2017-07-20 ENCOUNTER — Encounter: Payer: Self-pay | Admitting: Family Medicine

## 2017-07-20 ENCOUNTER — Ambulatory Visit (INDEPENDENT_AMBULATORY_CARE_PROVIDER_SITE_OTHER): Payer: PPO | Admitting: Family Medicine

## 2017-07-20 VITALS — BP 136/80 | HR 72 | Temp 98.2°F | Ht 67.0 in | Wt 216.6 lb

## 2017-07-20 DIAGNOSIS — Z794 Long term (current) use of insulin: Secondary | ICD-10-CM

## 2017-07-20 DIAGNOSIS — R1011 Right upper quadrant pain: Secondary | ICD-10-CM

## 2017-07-20 DIAGNOSIS — E1122 Type 2 diabetes mellitus with diabetic chronic kidney disease: Secondary | ICD-10-CM

## 2017-07-20 DIAGNOSIS — E1165 Type 2 diabetes mellitus with hyperglycemia: Secondary | ICD-10-CM

## 2017-07-20 DIAGNOSIS — N183 Chronic kidney disease, stage 3 (moderate): Secondary | ICD-10-CM | POA: Diagnosis not present

## 2017-07-20 DIAGNOSIS — IMO0002 Reserved for concepts with insufficient information to code with codable children: Secondary | ICD-10-CM

## 2017-07-20 NOTE — Progress Notes (Signed)
Subjective:  Rodney Farley is a 85 y.o. year old very pleasant male patient who presents for/with See problem oriented charting ROS- no fever, chills, nausea or vomiting. Does have RUQ pain with deep breaths   Past Medical History-  Patient Active Problem List   Diagnosis Date Noted  . Ruptured appendicitis 03/26/2017    Priority: High  . COPD (chronic obstructive pulmonary disease) ([place])     Priority: High  . Chronic systolic CHF (congestive heart failure) ([place]) 04/08/2015    Priority: High  . DOE (dyspnea on exertion), due to significant CAD 03/28/2015    Priority: High  . Chronic kidney disease (CKD), stage IV (severe) ([place]) 03/04/2015    Priority: High  . Coronary artery disease involving native coronary artery of native heart with angina pectoris ([place]) 09/13/2014    Priority: High  . Depression 08/05/2012    Priority: High  . Uncontrolled type 2 diabetes mellitus with stage 3 chronic kidney disease, with long-term current use of insulin ([place]) 08/05/2012    Priority: High  . Ischemic cardiomyopathy 11/03/2011    Priority: High  . Atrioventricular block, complete (HCC)     Priority: High  . Pacemaker     Priority: High  . BPH associated with nocturia 04/27/2016    Priority: Medium  . Diabetic peripheral neuropathy (HCC)     Priority: Medium  . Bell's palsy     Priority: Medium  . Gout 06/26/2015    Priority: Medium  . Sleep apnea 08/05/2012    Priority: Medium  . HTN (hypertension) 08/05/2012    Priority: Medium  . Diverticulitis 04/08/2015    Priority: Low  . GERD (gastroesophageal reflux disease) 08/07/2012    Priority: Low  . Obesity     Priority: Low  . Diarrhea 05/11/2017  . Marital stress 02/18/2017  . Trigger finger 06/10/2016    Medications- reviewed and updated Current Outpatient Medications  Medication Sig Dispense Refill  . aspirin EC 81 MG tablet Take 81 mg by mouth daily.    [name] atorvastatin (LIPITOR) 40 MG tablet Take 1 tablet (40 mg  total) by mouth daily. 90 tablet 3  . Blood Glucose Monitoring Suppl (FREESTYLE LITE) DEVI     . carvedilol (COREG) 6.25 MG tablet Take 1 tablet (6.25 mg total) by mouth 2 (two) times daily with a meal. 180 tablet 3  . gabapentin (NEURONTIN) 100 MG capsule Take 1 capsule (100 mg total) at bedtime by mouth. 90 capsule 1  . glucose blood (FREESTYLE TEST STRIPS) test strip Use to check blood sugar daily 100 each 3  . glucose monitoring kit (FREESTYLE) monitoring kit USE TO MONITOR BLOOD GLUCOSE AS DIRECTED 1 each 1  . Insulin Glargine (LANTUS SOLOSTAR) 100 UNIT/ML Solostar Pen Inject 60 Units into the skin daily at 10 pm. 5 pen PRN  . Insulin Pen Needle (UNIFINE PENTIPS) 31G X 5 MM MISC USE AS DIRECTED 100 each 0  . lisinopril (PRINIVIL,ZESTRIL) 2.5 MG tablet Take 1 tablet (2.5 mg total) by mouth daily. 90 tablet 3  . nitroGLYCERIN (NITROSTAT) 0.4 MG SL tablet Place 1 tablet (0.4 mg total) under the tongue every 5 (five) minutes as needed for chest pain. 2 doses before calling for emergent help 25 tablet 3  . pantoprazole (PROTONIX) 40 MG tablet TAKE 1 TABLET BY MOUTH ONCE DAILY (Patient taking differently: TAKE 1 TABLET (40 MG) BY MOUTH ONCE DAILY) 90 tablet 3  . polyethylene glycol (MIRALAX / GLYCOLAX) packet Take 17 g by mouth daily. [place]  each 0  . sertraline (ZOLOFT) 100 MG tablet Take 1.5 tablets (150 mg total) by mouth daily. 135 tablet 1  . tamsulosin (FLOMAX) 0.4 MG CAPS capsule TAKE 1 CAPSULE BY MOUTH DAILY AFTER SUPPER (Patient taking differently: Take 0.4 mg by mouth daily after supper. ) 90 capsule 3  . triamcinolone cream (KENALOG) 0.1 % Apply 1 application topically 2 (two) times daily. For 7-10 days maximum 80 g 0  . vitamin B-12 (CYANOCOBALAMIN) 1000 MCG tablet Take 1,000 mcg by mouth daily.    . furosemide (LASIX) 40 MG tablet Take 1 tablet (40 mg total) by mouth as needed (take as needed for swelling). 30 tablet 3  . Polyethylene Glycol 400 (BLINK TEARS) 0.25 % SOLN Place 1 drop into  both eyes 3 (three) times daily.     Objective: BP 136/80 (BP Location: Left Arm, Patient Position: Sitting, Cuff Size: Large)   Pulse 72   Temp 98.2 F (36.8 C) (Oral)   Ht _0  (1.702 m)   Wt 216 lb 9.6 oz (98.2 kg)   SpO2 99%   BMI 33.92 kg/m  Gen: NAD, resting comfortably CV: RRR no murmurs rubs or gallops Lungs: CTAB no crackles, wheeze, rhonchi Abdomen: soft/nontender/nondistended/normal bowel sounds. No rebound or guarding.  I cannot reproduce his abdominal pain with my exam but he reports pain in RUQ with any breath laying on his left side and with each deep breath on pulmonary exam.  Ext: no edema Skin: warm, dry, no rash over abdomen- large midline scar noted  Assessment/Plan:  Right upper quadrant pain - Plan: CT Abdomen Pelvis Wo Contrast, CBC with Differential/Platelet, Comprehensive metabolic panel S: for the last month patient has had abdominal pain in RUQ when he lays on his left side and breaths. THis has intensified in last week and now can have up to moderate level pain. History severe appendicitis and peritonitis recently which could have led to his death and had open surgery ad even ileocecectomy. Patient with pain with very deep breath with sitting up as well.  A/P:I am unsure what is causing his pain (pleuritic in RUQ but cannot reproduce on exam other than with getting patient to breath- do reproduce pain with having patient lie on his left side.. With his recent severe appendicitis and major surgery- I believe updating ct abd/pelvis is the right move. Avoid contrast with GFR under 30 on last check. I advised him to call his surgeon to update him in case he had a different opinion. With pleuritic nature- would consider x-ray at next visit later this month if no cause found on CT.   Uncontrolled type 2 diabetes mellitus with stage 3 chronic kidney disease, with long-term current use of insulin ([name]) S:  over controlled. On lantus 60 units. Has had several morning  sugars in 60s and really scared him when had 56 earlier this week.  Lab Results  Component Value Date   HGBA1C 5.6 06/18/2017   HGBA1C 8.2 (H) 01/28/2017   HGBA1C 6.8 03/26/2016   A/P: from avs "Reduce insulin to 55 units. Call me for any blood sugars under 80. "   Future Appointments  Date Time Provider [place]  08/12/2017  2:45 PM [name], MD [facility]  09/06/2017 11:00 AM [name], Haiden M, MD [facility]  09/20/2017  1:40 PM [facility] DEVICE REMOTES [facility]   Orders Placed This Encounter  Procedures  . CT Abdomen Pelvis Wo Contrast    Standing Status:   Future  Standing Expiration Date:   10/19/2018    Order Specific Question:   Preferred imaging location?    Answer:   [place]    Order Specific Question:   Radiology Contrast Protocol - do NOT remove file path    Answer:   [email or web address]    Order Specific Question:   Reason for Exam additional comments    Answer:   patient with severe appendicitis late 2018. several months later now having pleuritic pain in RUQ each time he takes a deep breath or when taking any breath when laying on left side.  [name] CBC with Differential/Platelet  . Comprehensive metabolic panel    [place]   Return precautions advised.  Garret Reddish, MD

## 2017-07-20 NOTE — Patient Instructions (Addendum)
Reduce insulin to 55 units. Call me for any blood sugars under 80.   Please stop by lab before you go  We will call you within a few days about Ct scan. Please let me know if you have fever or worsening of yours ymptoms.

## 2017-07-20 NOTE — Assessment & Plan Note (Signed)
S:  over controlled. On lantus 60 units. Has had several morning sugars in 60s and really scared him when had 56 earlier this week.  Lab Results  Component Value Date   HGBA1C 5.6 06/18/2017   HGBA1C 8.2 (H) 01/28/2017   HGBA1C 6.8 03/26/2016   A/P: from avs "Reduce insulin to 55 units. Call me for any blood sugars under 80. "

## 2017-07-21 LAB — CBC WITH DIFFERENTIAL/PLATELET
BASOS ABS: 0.1 10*3/uL (ref 0.0–0.1)
Basophils Relative: 1.3 % (ref 0.0–3.0)
EOS ABS: 0.4 10*3/uL (ref 0.0–0.7)
Eosinophils Relative: 3.7 % (ref 0.0–5.0)
HCT: 33 % — ABNORMAL LOW (ref 39.0–52.0)
Hemoglobin: 10.7 g/dL — ABNORMAL LOW (ref 13.0–17.0)
LYMPHS ABS: 2.3 10*3/uL (ref 0.7–4.0)
LYMPHS PCT: 20.7 % (ref 12.0–46.0)
MCHC: 32.4 g/dL (ref 30.0–36.0)
MCV: 92.5 fl (ref 78.0–100.0)
Monocytes Absolute: 0.7 10*3/uL (ref 0.1–1.0)
Monocytes Relative: 6.3 % (ref 3.0–12.0)
NEUTROS ABS: 7.5 10*3/uL (ref 1.4–7.7)
NEUTROS PCT: 68 % (ref 43.0–77.0)
PLATELETS: 259 10*3/uL (ref 150.0–400.0)
RBC: 3.57 Mil/uL — AB (ref 4.22–5.81)
RDW: 16.3 % — ABNORMAL HIGH (ref 11.5–15.5)
WBC: 11 10*3/uL — ABNORMAL HIGH (ref 4.0–10.5)

## 2017-07-21 LAB — COMPREHENSIVE METABOLIC PANEL
ALT: 16 U/L (ref 0–53)
AST: 13 U/L (ref 0–37)
Albumin: 3.9 g/dL (ref 3.5–5.2)
Alkaline Phosphatase: 82 U/L (ref 39–117)
BILIRUBIN TOTAL: 0.3 mg/dL (ref 0.2–1.2)
BUN: 50 mg/dL — AB (ref 6–23)
CO2: 22 meq/L (ref 19–32)
CREATININE: 2.14 mg/dL — AB (ref 0.40–1.50)
Calcium: 8.8 mg/dL (ref 8.4–10.5)
Chloride: 112 mEq/L (ref 96–112)
GFR: 31.49 mL/min — ABNORMAL LOW (ref >60.00)
GLUCOSE: 101 mg/dL — AB (ref 70–99)
Potassium: 5.3 mEq/L — ABNORMAL HIGH (ref 3.5–5.1)
Sodium: 142 mEq/L (ref 135–145)
TOTAL PROTEIN: 6.4 g/dL (ref 6.0–8.3)

## 2017-07-23 ENCOUNTER — Ambulatory Visit (INDEPENDENT_AMBULATORY_CARE_PROVIDER_SITE_OTHER)
Admission: RE | Admit: 2017-07-23 | Discharge: 2017-07-23 | Disposition: A | Discharge status: Home / Self Care | Payer: PPO | Source: Ambulatory Visit | Attending: Family Medicine | Admitting: Family Medicine

## 2017-07-23 ENCOUNTER — Other Ambulatory Visit: Payer: Self-pay

## 2017-07-23 DIAGNOSIS — E875 Hyperkalemia: Secondary | ICD-10-CM

## 2017-07-23 DIAGNOSIS — K8020 Calculus of gallbladder without cholecystitis without obstruction: Secondary | ICD-10-CM | POA: Diagnosis not present

## 2017-07-23 DIAGNOSIS — R1011 Right upper quadrant pain: Secondary | ICD-10-CM

## 2017-07-23 DIAGNOSIS — Z8719 Personal history of other diseases of the digestive system: Secondary | ICD-10-CM

## 2017-07-23 DIAGNOSIS — N281 Cyst of kidney, acquired: Secondary | ICD-10-CM

## 2017-07-23 HISTORY — DX: Cyst of kidney, acquired: N28.1

## 2017-07-23 HISTORY — DX: Personal history of other diseases of the digestive system: Z87.19

## 2017-07-24 ENCOUNTER — Encounter: Payer: Self-pay | Admitting: Family Medicine

## 2017-07-27 ENCOUNTER — Other Ambulatory Visit (INDEPENDENT_AMBULATORY_CARE_PROVIDER_SITE_OTHER): Payer: PPO

## 2017-07-27 ENCOUNTER — Telehealth: Payer: Self-pay | Admitting: Family Medicine

## 2017-07-27 DIAGNOSIS — E875 Hyperkalemia: Secondary | ICD-10-CM

## 2017-07-27 NOTE — Telephone Encounter (Signed)
Copied from [place]. Topic: Inquiry >> Jul 27, 2017  4:01 PM Oliver Pila B wrote: Reason for CRM: pt's son called to speak w/ jamie the nurse of Dr. Yong Channel; pt had results of ct scan and Dr. Yong Channel suggested for the pt to contact Dr. Dema Severin, thru the pt's mychart pt has gotten confused on what Dr to see for abdominal pain and needs to be advised on which Dr to see, contact pt to advise

## 2017-07-28 ENCOUNTER — Encounter: Payer: Self-pay | Admitting: Family Medicine

## 2017-07-28 LAB — BASIC METABOLIC PANEL
BUN: 44 mg/dL — ABNORMAL HIGH (ref 6–23)
CO2: 23 mEq/L (ref 19–32)
Calcium: 8.8 mg/dL (ref 8.4–10.5)
Chloride: 109 mEq/L (ref 96–112)
Creatinine, Ser: 2.47 mg/dL — ABNORMAL HIGH (ref 0.40–1.50)
GFR: 26.68 mL/min — AB (ref >60.00)
Glucose, Bld: 93 mg/dL (ref 70–99)
POTASSIUM: 5.1 meq/L (ref 3.5–5.1)
SODIUM: 140 meq/L (ref 135–145)

## 2017-07-29 ENCOUNTER — Other Ambulatory Visit: Payer: Self-pay

## 2017-07-29 ENCOUNTER — Encounter: Payer: Self-pay | Admitting: Family Medicine

## 2017-07-29 NOTE — Telephone Encounter (Signed)
Copied from [place] [phone]. Topic: General - Other >> Jul 29, 2017  3:55 PM Yvette Rack wrote: Reason for CRM: patient daughter in law Geral Coker calling stating that Dr Yong Channel told her to call him and give number of pt sugar this morning it was 47 when he woke up but its better now since he had ate some fruit this morning with a reading of 121

## 2017-07-30 ENCOUNTER — Encounter: Payer: Self-pay | Admitting: Family Medicine

## 2017-07-30 NOTE — Telephone Encounter (Signed)
Spoke to pt's son [name], told him pt is suppose to contact Dr. Dema Severin regarding abdominal pain. [name] verbalized understanding and said pt has an appt next week with Dr. Dema Severin. Told him okay.

## 2017-07-31 ENCOUNTER — Encounter: Payer: Self-pay | Admitting: Family Medicine

## 2017-08-05 ENCOUNTER — Encounter: Payer: Self-pay | Admitting: Family Medicine

## 2017-08-05 DIAGNOSIS — R1011 Right upper quadrant pain: Secondary | ICD-10-CM | POA: Diagnosis not present

## 2017-08-06 ENCOUNTER — Encounter: Payer: Self-pay | Admitting: Family Medicine

## 2017-08-09 DIAGNOSIS — E113293 Type 2 diabetes mellitus with mild nonproliferative diabetic retinopathy without macular edema, bilateral: Secondary | ICD-10-CM | POA: Diagnosis not present

## 2017-08-09 DIAGNOSIS — H16212 Exposure keratoconjunctivitis, left eye: Secondary | ICD-10-CM | POA: Diagnosis not present

## 2017-08-09 DIAGNOSIS — Z961 Presence of intraocular lens: Secondary | ICD-10-CM | POA: Diagnosis not present

## 2017-08-09 DIAGNOSIS — H2511 Age-related nuclear cataract, right eye: Secondary | ICD-10-CM | POA: Diagnosis not present

## 2017-08-09 DIAGNOSIS — H04123 Dry eye syndrome of bilateral lacrimal glands: Secondary | ICD-10-CM | POA: Diagnosis not present

## 2017-08-09 DIAGNOSIS — H02104 Unspecified ectropion of left upper eyelid: Secondary | ICD-10-CM | POA: Diagnosis not present

## 2017-08-09 DIAGNOSIS — H35372 Puckering of macula, left eye: Secondary | ICD-10-CM | POA: Diagnosis not present

## 2017-08-10 ENCOUNTER — Other Ambulatory Visit: Payer: Self-pay | Admitting: Surgery

## 2017-08-10 DIAGNOSIS — R1011 Right upper quadrant pain: Secondary | ICD-10-CM

## 2017-08-11 ENCOUNTER — Encounter: Payer: Self-pay | Admitting: Family Medicine

## 2017-08-12 ENCOUNTER — Encounter: Payer: Self-pay | Admitting: Family Medicine

## 2017-08-12 ENCOUNTER — Ambulatory Visit (INDEPENDENT_AMBULATORY_CARE_PROVIDER_SITE_OTHER): Payer: PPO | Admitting: *Deleted

## 2017-08-12 ENCOUNTER — Telehealth: Payer: Self-pay

## 2017-08-12 ENCOUNTER — Ambulatory Visit (INDEPENDENT_AMBULATORY_CARE_PROVIDER_SITE_OTHER): Payer: PPO | Admitting: Family Medicine

## 2017-08-12 ENCOUNTER — Encounter: Payer: Self-pay | Admitting: *Deleted

## 2017-08-12 VITALS — BP 138/72 | HR 79 | Temp 98.1°F | Ht 67.0 in | Wt 218.6 lb

## 2017-08-12 VITALS — BP 138/72 | Ht 67.0 in | Wt 218.0 lb

## 2017-08-12 DIAGNOSIS — Z794 Long term (current) use of insulin: Secondary | ICD-10-CM

## 2017-08-12 DIAGNOSIS — M79672 Pain in left foot: Secondary | ICD-10-CM | POA: Diagnosis not present

## 2017-08-12 DIAGNOSIS — N184 Chronic kidney disease, stage 4 (severe): Secondary | ICD-10-CM | POA: Diagnosis not present

## 2017-08-12 DIAGNOSIS — Z0000 Encounter for general adult medical examination without abnormal findings: Secondary | ICD-10-CM

## 2017-08-12 DIAGNOSIS — J449 Chronic obstructive pulmonary disease, unspecified: Secondary | ICD-10-CM | POA: Diagnosis not present

## 2017-08-12 DIAGNOSIS — M79671 Pain in right foot: Secondary | ICD-10-CM

## 2017-08-12 DIAGNOSIS — E1122 Type 2 diabetes mellitus with diabetic chronic kidney disease: Secondary | ICD-10-CM | POA: Diagnosis not present

## 2017-08-12 DIAGNOSIS — R1011 Right upper quadrant pain: Secondary | ICD-10-CM | POA: Diagnosis not present

## 2017-08-12 DIAGNOSIS — F325 Major depressive disorder, single episode, in full remission: Secondary | ICD-10-CM | POA: Diagnosis not present

## 2017-08-12 DIAGNOSIS — I5022 Chronic systolic (congestive) heart failure: Secondary | ICD-10-CM

## 2017-08-12 DIAGNOSIS — I442 Atrioventricular block, complete: Secondary | ICD-10-CM | POA: Diagnosis not present

## 2017-08-12 MED ORDER — ALBUTEROL SULFATE HFA 108 (90 BASE) MCG/ACT IN AERS: 2.0000 | INHALATION_SPRAY | Freq: Four times a day (QID) | RESPIRATORY_TRACT | 2 refills | Status: DC | PRN

## 2017-08-12 NOTE — Assessment & Plan Note (Signed)
Doing well with stable edema  And minimal weight gain on lasix 40mg  every other day- continue current meds and has cardiology follow up next month

## 2017-08-12 NOTE — Patient Instructions (Addendum)
Rodney Farley , Thank you for taking time to come for your Medicare Wellness Visit. I appreciate your ongoing commitment to your health goals. Please review the following plan we discussed and let me know if I can assist you in the future.   Will make apt with Dr. [name] for your ongoing foot pain  Advanced Directive; Reviewed advanced directive and agreed to receipt of information and discussion.  Focused face to face x  20 minutes discussing HCPOA and Living will and reviewed all the questions in the [place] forms. The patient voices understanding of HCPOA; LW reviewed and information provided on each question. Educated on how to revoke this HCPOA or LW at any time.   Also  discussed life prolonging measures (given a few examples) and where he could choose to initiate or not;  the ability to given the HCPOA power to change his living will or not if he cannot speak for himself; as well as finalizing the will by 2 unrelated witnesses and notary.  Will call for questions and given information on [facility] pastoral department for further assistance.    Shingrix is a vaccine for the prevention of Shingles in Adults 50 and older.  If you are on Medicare, you can request a prescription from your doctor to be filled at a pharmacy.  Please check with your benefits regarding applicable copays or out of pocket expenses.  The Shingrix is given in 2 vaccines approx 8 weeks apart. You must receive the 2nd dose prior to 6 months from receipt of the first.   Let Dr. Yong Channel do a skin check  No issues verbalized but did educate  These are the goals we discussed: Goals    . Exercise 150 min/wk Moderate Activity     Will join the silver sneaker program  Does balance exercises at home        This is a list of the screening recommended for you and due dates:  Health Maintenance  Topic Date Due  . Complete foot exam   04/18/2016  . Hemoglobin A1C  12/17/2017  . Eye exam for diabetics  08/10/2018  .  Tetanus Vaccine  09/18/2024  . Flu Shot  Completed  . Pneumonia vaccines  Completed     Health Maintenance, Male A healthy lifestyle and preventive care is important for your health and wellness. Ask your health care provider about what schedule of regular examinations is right for you. What should I know about weight and diet? Eat a Healthy Diet  Eat plenty of vegetables, fruits, whole grains, low-fat dairy products, and lean protein.  Do not eat a lot of foods high in solid fats, added sugars, or salt.  Maintain a Healthy Weight Regular exercise can help you achieve or maintain a healthy weight. You should:  Do at least 150 minutes of exercise each week. The exercise should increase your heart rate and make you sweat (moderate-intensity exercise).  Do strength-training exercises at least twice a week.  Watch Your Levels of Cholesterol and Blood Lipids  Have your blood tested for lipids and cholesterol every 5 years starting at 82 years of age. If you are at high risk for heart disease, you should start having your blood tested when you are 82 years old. You may need to have your cholesterol levels checked more often if: ? Your lipid or cholesterol levels are high. ? You are older than 82 years of age. ? You are at high risk for heart disease.  What should I know about cancer screening? Many types of cancers can be detected early and may often be prevented. Lung Cancer  You should be screened every year for lung cancer if: ? You are a current smoker who has smoked for at least 30 years. ? You are a former smoker who has quit within the past 15 years.  Talk to your health care provider about your screening options, when you should start screening, and how often you should be screened.  Colorectal Cancer  Routine colorectal cancer screening usually begins at 82 years of age and should be repeated every 5-10 years until you are 82 years old. You may need to be screened more  often if early forms of precancerous polyps or small growths are found. Your health care provider may recommend screening at an earlier age if you have risk factors for colon cancer.  Your health care provider may recommend using home test kits to check for hidden blood in the stool.  A small camera at the end of a tube can be used to examine your colon (sigmoidoscopy or colonoscopy). This checks for the earliest forms of colorectal cancer.  Prostate and Testicular Cancer  Depending on your age and overall health, your health care provider may do certain tests to screen for prostate and testicular cancer.  Talk to your health care provider about any symptoms or concerns you have about testicular or prostate cancer.  Skin Cancer  Check your skin from head to toe regularly.  Tell your health care provider about any new moles or changes in moles, especially if: ? There is a change in a mole's size, shape, or color. ? You have a mole that is larger than a pencil eraser.  Always use sunscreen. Apply sunscreen liberally and repeat throughout the day.  Protect yourself by wearing long sleeves, pants, a wide-brimmed hat, and sunglasses when outside.  What should I know about heart disease, diabetes, and high blood pressure?  If you are 74-24 years of age, have your blood pressure checked every 3-5 years. If you are 59 years of age or older, have your blood pressure checked every year. You should have your blood pressure measured twice-once when you are at a hospital or clinic, and once when you are not at a hospital or clinic. Record the average of the two measurements. To check your blood pressure when you are not at a hospital or clinic, you can use: ? An automated blood pressure machine at a pharmacy. ? A home blood pressure monitor.  Talk to your health care provider about your target blood pressure.  If you are between 63-34 years old, ask your health care provider if you should take  aspirin to prevent heart disease.  Have regular diabetes screenings by checking your fasting blood sugar level. ? If you are at a normal weight and have a low risk for diabetes, have this test once every three years after the age of 23. ? If you are overweight and have a high risk for diabetes, consider being tested at a younger age or more often.  A one-time screening for abdominal aortic aneurysm (AAA) by ultrasound is recommended for men aged 61-75 years who are current or former smokers. What should I know about preventing infection? Hepatitis B If you have a higher risk for hepatitis B, you should be screened for this virus. Talk with your health care provider to find out if you are at risk for hepatitis B infection. Hepatitis  C Blood testing is recommended for:  Everyone born from [birth year] through 1963.  Anyone with known risk factors for hepatitis C.  Sexually Transmitted Diseases (STDs)  You should be screened each year for STDs including gonorrhea and chlamydia if: ? You are sexually active and are younger than 82 years of age. ? You are older than 82 years of age and your health care provider tells you that you are at risk for this type of infection. ? Your sexual activity has changed since you were last screened and you are at an increased risk for chlamydia or gonorrhea. Ask your health care provider if you are at risk.  Talk with your health care provider about whether you are at high risk of being infected with HIV. Your health care provider may recommend a prescription medicine to help prevent HIV infection.  What else can I do?  Schedule regular health, dental, and eye exams.  Stay current with your vaccines (immunizations).  Do not use any tobacco products, such as cigarettes, chewing tobacco, and e-cigarettes. If you need help quitting, ask your health care provider.  Limit alcohol intake to no more than 2 drinks per day. One drink equals 12 ounces of beer, 5 ounces of  wine, or 1 ounces of hard liquor.  Do not use street drugs.  Do not share needles.  Ask your health care provider for help if you need support or information about quitting drugs.  Tell your health care provider if you often feel depressed.  Tell your health care provider if you have ever been abused or do not feel safe at home. This information is not intended to replace advice given to you by your health care provider. Make sure you discuss any questions you have with your health care provider. Document Released: 12/26/2007 Document Revised: 02/26/2016 Document Reviewed: 04/02/2015 Elsevier Interactive Patient Education  2018 Candor in the Home Falls can cause injuries and can affect people from all age groups. There are many simple things that you can do to make your home safe and to help prevent falls. What can I do on the outside of my home?  Regularly repair the edges of walkways and driveways and fix any cracks.  Remove high doorway thresholds.  Trim any shrubbery on the main path into your home.  Use bright outdoor lighting.  Clear walkways of debris and clutter, including tools and rocks.  Regularly check that handrails are securely fastened and in good repair. Both sides of any steps should have handrails.  Install guardrails along the edges of any raised decks or porches.  Have leaves, snow, and ice cleared regularly.  Use sand or salt on walkways during winter months.  In the garage, clean up any spills right away, including grease or oil spills. What can I do in the bathroom?  Use night lights.  Install grab bars by the toilet and in the tub and shower. Do not use towel bars as grab bars.  Use non-skid mats or decals on the floor of the tub or shower.  If you need to sit down while you are in the shower, use a plastic, non-slip stool.  Keep the floor dry. Immediately clean up any water that spills on the floor.  Remove soap  buildup in the tub or shower on a regular basis.  Attach bath mats securely with double-sided non-slip rug tape.  Remove throw rugs and other tripping hazards from the floor. What can I do  in the bedroom?  Use night lights.  Make sure that a bedside light is easy to reach.  Do not use oversized bedding that drapes onto the floor.  Have a firm chair that has side arms to use for getting dressed.  Remove throw rugs and other tripping hazards from the floor. What can I do in the kitchen?  Clean up any spills right away.  Avoid walking on wet floors.  Place frequently used items in easy-to-reach places.  If you need to reach for something above you, use a sturdy step stool that has a grab bar.  Keep electrical cables out of the way.  Do not use floor polish or wax that makes floors slippery. If you have to use wax, make sure that it is non-skid floor wax.  Remove throw rugs and other tripping hazards from the floor. What can I do in the stairways?  Do not leave any items on the stairs.  Make sure that there are handrails on both sides of the stairs. Fix handrails that are broken or loose. Make sure that handrails are as long as the stairways.  Check any carpeting to make sure that it is firmly attached to the stairs. Fix any carpet that is loose or worn.  Avoid having throw rugs at the top or bottom of stairways, or secure the rugs with carpet tape to prevent them from moving.  Make sure that you have a light switch at the top of the stairs and the bottom of the stairs. If you do not have them, have them installed. What are some other fall prevention tips?  Wear closed-toe shoes that fit well and support your feet. Wear shoes that have rubber soles or low heels.  When you use a stepladder, make sure that it is completely opened and that the sides are firmly locked. Have someone hold the ladder while you are using it. Do not climb a closed stepladder.  Add color or contrast  paint or tape to grab bars and handrails in your home. Place contrasting color strips on the first and last steps.  Use mobility aids as needed, such as canes, walkers, scooters, and crutches.  Turn on lights if it is dark. Replace any light bulbs that burn out.  Set up furniture so that there are clear paths. Keep the furniture in the same spot.  Fix any uneven floor surfaces.  Choose a carpet design that does not hide the edge of steps of a stairway.  Be aware of any and all pets.  Review your medicines with your healthcare provider. Some medicines can cause dizziness or changes in blood pressure, which increase your risk of falling. Talk with your health care provider about other ways that you can decrease your risk of falls. This may include working with a physical therapist or trainer to improve your strength, balance, and endurance. This information is not intended to replace advice given to you by your health care provider. Make sure you discuss any questions you have with your health care provider. Document Released: 06/19/2002 Document Revised: 11/26/2015 Document Reviewed: 08/03/2014 Elsevier Interactive Patient Education  [employer].

## 2017-08-12 NOTE — Progress Notes (Signed)
Subjective:  Tallen Schnorr is a 84 y.o. year old very pleasant male patient who presents for/with See problem oriented charting ROS- baseline SOB. Baseline edema. No chest pain. No ulceration on foot.    Past Medical History-  Patient Active Problem List   Diagnosis Date Noted  . Ruptured appendicitis 03/26/2017    Priority: High  . Marital stress 02/18/2017    Priority: High  . COPD (chronic obstructive pulmonary disease) ([place])     Priority: High  . Chronic systolic CHF (congestive heart failure) ([place]) 04/08/2015    Priority: High  . DOE (dyspnea on exertion), due to significant CAD 03/28/2015    Priority: High  . Chronic kidney disease (CKD), stage IV (severe) ([place]) 03/04/2015    Priority: High  . Coronary artery disease involving native coronary artery of native heart with angina pectoris ([place]) 09/13/2014    Priority: High  . Major depression in full remission ([place]) 08/05/2012    Priority: High  . Controlled type 2 diabetes mellitus with stage 4 chronic kidney disease ([place]) 08/05/2012    Priority: High  . Ischemic cardiomyopathy 11/03/2011    Priority: High  . Atrioventricular block, complete (HCC)     Priority: High  . Pacemaker     Priority: High  . BPH associated with nocturia 04/27/2016    Priority: Medium  . Diabetic peripheral neuropathy (HCC)     Priority: Medium  . Bell's palsy     Priority: Medium  . Gout 06/26/2015    Priority: Medium  . Sleep apnea 08/05/2012    Priority: Medium  . HTN (hypertension) 08/05/2012    Priority: Medium  . Diverticulitis 04/08/2015    Priority: Low  . GERD (gastroesophageal reflux disease) 08/07/2012    Priority: Low  . Obesity     Priority: Low  . Diarrhea 05/11/2017  . Trigger finger 06/10/2016    Medications- reviewed and updated Current Outpatient Medications  Medication Sig Dispense Refill  . aspirin EC 81 MG tablet Take 81 mg by mouth daily.    [name] atorvastatin (LIPITOR) 40 MG tablet Take 1 tablet (40 mg  total) by mouth daily. 90 tablet 3  . Blood Glucose Monitoring Suppl (FREESTYLE LITE) DEVI     . carvedilol (COREG) 6.25 MG tablet Take 1 tablet (6.25 mg total) by mouth 2 (two) times daily with a meal. 180 tablet 3  . gabapentin (NEURONTIN) 100 MG capsule Take 1 capsule (100 mg total) at bedtime by mouth. 90 capsule 1  . glucose blood (FREESTYLE TEST STRIPS) test strip Use to check blood sugar daily 100 each 3  . glucose monitoring kit (FREESTYLE) monitoring kit USE TO MONITOR BLOOD GLUCOSE AS DIRECTED 1 each 1  . Insulin Glargine (LANTUS SOLOSTAR) 100 UNIT/ML Solostar Pen Inject 60 Units into the skin daily at 10 pm. (Patient taking differently: Inject 40 Units into the skin daily at 10 pm. ) 5 pen PRN  . Insulin Pen Needle (UNIFINE PENTIPS) 31G X 5 MM MISC USE AS DIRECTED 100 each 0  . lisinopril (PRINIVIL,ZESTRIL) 2.5 MG tablet Take 1 tablet (2.5 mg total) by mouth daily. 90 tablet 3  . nitroGLYCERIN (NITROSTAT) 0.4 MG SL tablet Place 1 tablet (0.4 mg total) under the tongue every 5 (five) minutes as needed for chest pain. 2 doses before calling for emergent help 25 tablet 3  . pantoprazole (PROTONIX) 40 MG tablet TAKE 1 TABLET BY MOUTH ONCE DAILY (Patient taking differently: TAKE 1 TABLET (40 MG) BY MOUTH ONCE DAILY) 90  tablet 3  . polyethylene glycol (MIRALAX / GLYCOLAX) packet Take 17 g by mouth daily. 14 each 0  . Polyethylene Glycol 400 (BLINK TEARS) 0.25 % SOLN Place 1 drop into both eyes 3 (three) times daily.    . sertraline (ZOLOFT) 100 MG tablet Take 1.5 tablets (150 mg total) by mouth daily. 135 tablet 1  . tamsulosin (FLOMAX) 0.4 MG CAPS capsule TAKE 1 CAPSULE BY MOUTH DAILY AFTER SUPPER (Patient taking differently: Take 0.4 mg by mouth daily after supper. ) 90 capsule 3  . triamcinolone cream (KENALOG) 0.1 % Apply 1 application topically 2 (two) times daily. For 7-10 days maximum 80 g 0  . vitamin B-12 (CYANOCOBALAMIN) 1000 MCG tablet Take 1,000 mcg by mouth daily.    . furosemide  (LASIX) 40 MG tablet Take 1 tablet (40 mg total) by mouth as needed (take as needed for swelling). 30 tablet 3   No current facility-administered medications for this visit.     Objective: BP 138/72 (BP Location: Left Arm, Patient Position: Sitting, Cuff Size: Large)   Pulse 79   Temp 98.1 F (36.7 C) (Oral)   Ht '5\' 7"'  (1.702 m)   Wt 218 lb 9.6 oz (99.2 kg)   SpO2 97%   BMI 34.24 kg/m  Gen: NAD, resting comfortably CV: RRR no murmurs rubs or gallops Lungs: CTAB no crackles, wheeze, rhonchi but prolonged expiratory phase Ext: 1+ pitting edema bilaterally Skin: warm, dry Neuro: walks with cane today Left foot: no ulceratoin  Ct Abdomen Pelvis Wo Contrast Result Date: 07/24/2017 CLINICAL DATA:  Right upper quadrant pain. EXAM: CT ABDOMEN AND PELVIS WITHOUT CONTRAST TECHNIQUE: Multidetector CT imaging of the abdomen and pelvis was performed following the standard protocol without IV contrast. COMPARISON:  03/26/2017 FINDINGS: Lower chest: Heart is mildly enlarged. Pacer wires noted. No acute abnormality. No effusions. Hepatobiliary: 3.3 cm gallstone within a contracted gallbladder. No focal hepatic abnormality. Pancreas: No focal abnormality or ductal dilatation. Scattered calcifications compatible with chronic pancreatitis. Spleen: No focal abnormality.  Normal size. Adrenals/Urinary Tract: Bilateral low and high-density lesions throughout the kidneys, likely a combination of simple and hemorrhagic cysts. No hydronephrosis. No change. Adrenal glands and urinary bladder unremarkable. Stomach/Bowel: Moderate stool burden in the colon. Scattered sigmoid diverticula. There appears to be contrast filling an appendix. No current evidence for appendicitis or inflammatory process in the right lower quadrant. Previously seen pericecal complex fluid collection no longer visualized. No evidence of obstruction. Vascular/Lymphatic: Diffuse aortic and iliac calcifications. No aneurysm or adenopathy.  Reproductive: Prostate enlargement. Other: No free fluid or free air. Musculoskeletal: No acute bony abnormality. IMPRESSION: There with appears to be an appendix filling the contrast on today's study. There is no pericecal inflammation. Previously seen inflammatory process around the appendix in the right lower quadrant has resolved. Cholelithiasis. Bilateral renal simple and hemorrhagic cysts. Aortoiliac atherosclerosis. Scattered calcifications in the pancreas compatible with chronic pancreatitis. No acute findings in the abdomen or pelvis. Electronically Signed   By: Rolm Baptise M.D.   On: 07/24/2017 11:58     Assessment/Plan:  Other notes 1. Remove from list of problems- diabetic foot ulcer- none noted  Right upper quadrant pain S: saw his surgeon Dr. Dema Severin- states there is no appendix. There is a gallbladder still and has gallstone- plan is surgery if ultrasound confirms the finding (see report above)  A/P: Patient relieved to have an answer to pain- he is sad this requires surgical intervention potentially but he does want to feel better. Will need  cardiac clearance with Dr. [name] per Dr. Dema Severin- patient already has visit scheduled for this  Controlled type 2 diabetes mellitus with stage 4 chronic kidney disease ([place]) S:  controlled. On 40 units of insulin- this morning blood sugar was 127 but yesterday wa s70- had sent message to go down to 35 units but he hasnt seen it yet Lab Results  Component Value Date   HGBA1C 5.6 06/18/2017   HGBA1C 8.2 (H) 01/28/2017   HGBA1C 6.8 03/26/2016   A/P: go down on lantus to 35 units. Follow up 2-3 months. Planning on starting silver sneakers soon.   Chronic kidney disease (CKD), stage IV (severe) ([place]) Follows with [facility]- last GFR stable in 20s.   COPD (chronic obstructive pulmonary disease) (HCC) S: Severe per patient report from prior physicians- yet he is on no inhalers. Does have SOB with exertion if pushes himself- he knows his  stopping point though and wise enough to not push past this A/P: I did encourage patient to at least have albuterol on hand if needed for shortness of breath or wheeze- he agrees  Major depression in full remission ([place]) Patient's symptoms very well controlled on zoloft 157m. PHQ9 of 1 today with no SI with no anhedonia or depressed mood  Atrioventricular block, complete ([place] Pacemaker in place. Has cardiology follow up next month.   Chronic systolic CHF (congestive heart failure) ([place] Doing well with stable edema  And minimal weight gain on lasix 472mevery other day- continue current meds and has cardiology follow up next month   Future Appointments  Date Time Provider [place]/12/2017  8:30 AM [facility] [place] [facility]  09/06/2017 11:00 AM JoMartiniquePeter M, MD [facility]  1:40 PM [facility] DEVICE REMOTES [facility]  advised 2-3 month follow up with me  Meds ordered this encounter  Medications  . albuterol (PROVENTIL HFA;VENTOLIN HFA) 108 (90 Base) MCG/ACT inhaler    Sig: Inhale 2 puffs into the lungs every 6 (six) hours as needed for wheezing or shortness of breath.    Dispense:  1 Inhaler    Refill:  2    Return precautions advised.  StGarret ReddishMD

## 2017-08-12 NOTE — Progress Notes (Signed)
Subjective:   Rodney Farley is a 84 y.o. male who presents for Medicare Annual/Subsequent preventive examination.  Reviewed good dtr in law and son; lives 2 blocks away  [place] children;  3 grands  Free standing home with a couple of steps to the door ADL he is independent IADL dtr in law;  Monitors his bills  Limited driving to day time and no bad weather   Diet Breakfast is small Oatmeal; toast ,mild Lunch; grilled cheese on [employer]; largest meal of the day  Exercise;  Son has just had a knee replacement Will go to silver sneaker  He just had appendix burst and they were at [facility]  Home huge amounts of PT  Now independent  Left side is weaker than his right side   Balance is the problem; uses the walker at church Facing another surgery with pain in RUQ - stone in gallbladder   CT completed without evidence of aneurysm 07/24/2017 Smoking hx and did not discuss LDCT  Can call and fup    There are no preventive care reminders to display for this patient.  Cardiac Risk Factors include: advanced age (>73mn, >>59women);diabetes mellitus;family history of premature cardiovascular disease;dyslipidemia;male gender;hypertension;microalbuminuriatobacco      Objective:    Vitals: BP 138/72   Ht _0  (1.702 m)   Wt 218 lb (98.9 kg)   BMI 34.14 kg/m   Body mass index is 34.14 kg/m.  Advanced Directives 03/30/2017 03/26/2017 03/26/2017 01/10/2016 10/14/2015 09/24/2015 09/24/2015  Does Patient Have a Medical Advance Directive? _1  No No  Would patient like information on creating a medical advance directive? No - Patient declined No - Patient declined - No - patient declined information Yes - EScientist, clinical (histocompatibility and immunogenetics)given - -   AScientist, physiological Reviewed advanced directive and agreed to receipt of information and discussion.  Focused face to face x  20 minutes discussing HCPOA and Living will and reviewed all the questions in the [place]. The patient  voices understanding of HCPOA; LW reviewed and information provided on each question. Educated on how to revoke this HCPOA or LW at any time.   Also  discussed life prolonging measures (given a few examples) and where he could choose to initiate or not;  the ability to given the HCPOA power to change his living will or not if he cannot speak for himself; as well as finalizing the will by 2 unrelated witnesses and notary.  Will call for questions and [facility]. Information given to the patient and dtr in law as requested. The patient states he is ready to complete soon.        Tobacco Social History   Tobacco Use  Smoking Status Former Smoker  . Packs/day: 1.50  . Years: 54.00  . Pack years: 81.00  . Types: Cigarettes  . Last attempt to quit: 07/18/2007  . Years since quitting: 10.0  Smokeless Tobacco Never Used  Tobacco Comment   did not discuss LdCT but being evaluated for chole currently     Counseling given: Yes Comment: did not discuss LdCT but being evaluated for chole currently   Clinical Intake:    Past Medical History:  Diagnosis Date  . Anginal pain ([place]   . Anxiety   . Arm pain 05/08/2015   LEFT ARM  . Atrioventricular block, complete ([place]    a. 2010 s/p pacemaker.  . Bell's palsy  left side. after shingles episode  . Chronic systolic CHF (congestive heart failure) ([place])    a. LVEF 35-40% by echo 09/2014.  [name] CKD (chronic kidney disease), stage IV ([place])   . COPD (chronic obstructive pulmonary disease) ([facility])    Severe  . Coronary artery disease    a. s/p MI in 1994/1995 while in [place] s/p questionable PCI. 03/2015: progression of disease, for staged PCI.  [name] Depression   . Diabetic peripheral neuropathy ([place])   . DOE (dyspnea on exertion) 03/28/2015  . GERD (gastroesophageal reflux disease)   . Hypercholesterolemia   . Hypertension   . MI (myocardial infarction) ([place]) 1994; 1995  .  Neuropathy    IN LOWER EXTREMITIES  . Obesity   . Pacemaker    medtronic>>> MDT ICD 09/23/15  . Sleep apnea    "sleeps w/humidifyer when he panics and gets short of breath" (04/08/2015)  . TIA (transient ischemic attack) X 3  . Type II diabetes mellitus ([place])    Past Surgical History:  Procedure Laterality Date  . CARDIAC CATHETERIZATION N/A 03/29/2015   Procedure: Right/Left Heart Cath and Coronary Angiography;  Surgeon: Jerred M [name], MD;  Location: [place];  Service: Cardiovascular;  Laterality: N/A;  . [place]   "after my MI; put me on heart RX after cath"  . CARDIAC CATHETERIZATION N/A 04/09/2015   Procedure: Coronary Stent Intervention;  Surgeon: Ryen M [name], MD;  Location: [facility];  Service: Cardiovascular;  Laterality: N/A;  . CHOLECYSTECTOMY OPEN  ~ 1977  . EP IMPLANTABLE DEVICE N/A 09/23/2015   MDT CRT-D, Dr. Caryl Comes  . HIATAL HERNIA REPAIR  1977  . ILEOCECETOMY N/A 03/27/2017   Procedure: ILEOCECECTOMY;  Surgeon: Ileana Roup, MD;  Location: [facility];  Service: General;  Laterality: N/A;  . INSERT / REPLACE / REMOVE PACEMAKER  07/2008   Complete heart block status post DDD with good function  . LAPAROTOMY N/A 03/27/2017   Procedure: EXPLORATORY LAPAROTOMY;  Surgeon: Ileana Roup, MD;  Location: [facility];  Service: General;  Laterality: N/A;  . TONSILLECTOMY     Family History  Problem Relation Age of Onset  . Stroke Mother   . Leukemia Father   . Stroke Sister   . Heart attack Brother    Social History   Socioeconomic History  . Marital status: Married    Spouse name: Not on file  . Number of children: 1  . Years of education: college  . Highest education level: Not on file  Social Needs  . Financial resource strain: Not on file  . Food insecurity - worry: Not on file  . Food insecurity - inability: Not on file  . Transportation needs - medical: Not on file  . Transportation needs - non-medical: Not on file    Occupational History  . Occupation: Retired  Tobacco Use  . Smoking status: Former Smoker    Packs/day: 1.50    Years: 54.00    Pack years: 81.00    Types: Cigarettes    Last attempt to quit: 07/18/2007    Years since quitting: 10.0  . Smokeless tobacco: Never Used  . Tobacco comment: did not discuss LdCT but being evaluated for chole currently  Substance and Sexual Activity  . Alcohol use: No    Comment: 04/08/2015 "probably 3 drinks/month"  . Drug use: No  . Sexual activity: No  Other Topics Concern  . Not on file  Social History Narrative  Married 1994 (together since 1989) 1 son, 1 stepson. 3 grandkids, 6 great grandkids      Retired from [employer]. Had 2 years of collge.       Faith: Mormon    Outpatient Encounter Medications as of 08/12/2017  Medication Sig  . albuterol (PROVENTIL HFA;VENTOLIN HFA) 108 (90 Base) MCG/ACT inhaler Inhale 2 puffs into the lungs every 6 (six) hours as needed for wheezing or shortness of breath.  [name] aspirin EC 81 MG tablet Take 81 mg by mouth daily.  [name] atorvastatin (LIPITOR) 40 MG tablet Take 1 tablet (40 mg total) by mouth daily.  . Blood Glucose Monitoring Suppl (FREESTYLE LITE) DEVI   . carvedilol (COREG) 6.25 MG tablet Take 1 tablet (6.25 mg total) by mouth 2 (two) times daily with a meal.  . gabapentin (NEURONTIN) 100 MG capsule Take 1 capsule (100 mg total) at bedtime by mouth.  [name] glucose blood (FREESTYLE TEST STRIPS) test strip Use to check blood sugar daily  . glucose monitoring kit (FREESTYLE) monitoring kit USE TO MONITOR BLOOD GLUCOSE AS DIRECTED  . Insulin Glargine (LANTUS SOLOSTAR) 100 UNIT/ML Solostar Pen Inject 60 Units into the skin daily at 10 pm. (Patient taking differently: Inject 40 Units into the skin daily at 10 pm. )  . Insulin Pen Needle (UNIFINE PENTIPS) 31G X 5 MM MISC USE AS DIRECTED  . lisinopril (PRINIVIL,ZESTRIL) 2.5 MG tablet Take 1 tablet (2.5 mg total) by mouth daily.  . nitroGLYCERIN (NITROSTAT) 0.4 MG SL  tablet Place 1 tablet (0.4 mg total) under the tongue every 5 (five) minutes as needed for chest pain. 2 doses before calling for emergent help  . pantoprazole (PROTONIX) 40 MG tablet TAKE 1 TABLET BY MOUTH ONCE DAILY (Patient taking differently: TAKE 1 TABLET (40 MG) BY MOUTH ONCE DAILY)  . polyethylene glycol (MIRALAX / GLYCOLAX) packet Take 17 g by mouth daily.  . Polyethylene Glycol 400 (BLINK TEARS) 0.25 % SOLN Place 1 drop into both eyes 3 (three) times daily.  . sertraline (ZOLOFT) 100 MG tablet Take 1.5 tablets (150 mg total) by mouth daily.  . tamsulosin (FLOMAX) 0.4 MG CAPS capsule TAKE 1 CAPSULE BY MOUTH DAILY AFTER SUPPER (Patient taking differently: Take 0.4 mg by mouth daily after supper. )  . triamcinolone cream (KENALOG) 0.1 % Apply 1 application topically 2 (two) times daily. For 7-10 days maximum  . vitamin B-12 (CYANOCOBALAMIN) 1000 MCG tablet Take 1,000 mcg by mouth daily.  . furosemide (LASIX) 40 MG tablet Take 1 tablet (40 mg total) by mouth as needed (take as needed for swelling).   No facility-administered encounter medications on file as of 08/12/2017.     Activities of Daily Living In your present state of health, do you have any difficulty performing the following activities: 08/12/2017 03/26/2017  Hearing? N Y  Vision? N N  Difficulty concentrating or making decisions? N N  Walking or climbing stairs? Y Y  Dressing or bathing? N Y  Doing errands, shopping? N Y  Conservation officer, nature and eating ? N -  Using the Toilet? N -  In the past six months, have you accidently leaked urine? N -  Do you have problems with loss of bowel control? Y -  Comment diarrhea -  Managing your Medications? N -  Managing your Finances? Y -  Comment but oversees his fianances -  Some recent data might be hidden    Patient Care Team: [name], MD as PCP - General (Family Medicine)  Assessment:   This is a routine wellness examination for [place].  Exercise Activities and Dietary  recommendations Current Exercise Habits: Home exercise routine, Type of exercise: walking;stretching, Time (Minutes): 45, Frequency (Times/Week): 2, Weekly Exercise (Minutes/Week): 90, Intensity: Moderate  Goals    . Exercise 150 min/wk Moderate Activity     Will join the silver sneaker program  Does balance exercises at home        Fall Risk Fall Risk  08/12/2017 05/11/2017 04/02/2016 03/26/2016 01/10/2016  Falls in the past year? Yes No No Yes No  Number falls in past yr: 2 or more - - 2 or more -  Injury with Fall? No - - Yes -  Risk for fall due to : - - - - -  Follow up Education provided - - - -  Comment he has had PT due to surgical injury - - - -    Timed Get Up and Go Performed: approx 10 second from the chair. States he is walking much better since his PT and still exercises every day.   Depression Screen PHQ 2/9 Scores 08/12/2017 03/25/2017 04/02/2016 03/26/2016  PHQ - 2 Score 0 6 0 6  PHQ- 9 Score - 25 - 21    Cognitive Function MMSE - Mini Mental State Exam 08/12/2017  Not completed: (No Data)     Ad8 score reviewed for issues:  Issues making decisions:  Less interest in hobbies / activities:  Repeats questions, stories (family complaining):  Trouble using ordinary gadgets (microwave, computer, phone):  Forgets the month or year:   Mismanaging finances:   Remembering appts:  Daily problems with thinking and/or memory: Ad8 score is=0        Immunization History  Administered Date(s) Administered  . Influenza, High Dose Seasonal PF 05/11/2017  . Influenza,inj,Quad PF,6+ Mos 08/27/2013, 09/19/2014, 04/10/2015, 03/26/2016  . Pneumococcal Conjugate-13 09/19/2014  . Pneumococcal Polysaccharide-23 04/27/2016  . Tdap 09/19/2014     Screening Tests Health Maintenance  Topic Date Due  . HEMOGLOBIN A1C  12/17/2017  . OPHTHALMOLOGY EXAM  08/10/2018  . FOOT EXAM  08/12/2018  . TETANUS/TDAP  09/18/2024  . INFLUENZA VACCINE  Completed  . PNA vac Low  Risk Adult  Completed      Plan:      PCP Notes   Health Maintenance Educated regarding shingrix / hx of shingles;  Educated regarding advanced Directives which he has not completed.  Plans to join silver sneakers with his son  Get up and go is good; PT post recent surgery was successful in increasing his independence   Smoking hx noted but did not have an opportunity to discuss LDCT CT of abd/pelvis noted no aneurysm  Reviewed AD with Dtr. He agreed to complete this soon   Eye apt with Dr. Katy Fitch x 2 days ago Report to be sent to Dr. Yong Channel    Abnormal Screens  BMI but weight is stable and get up and go is good; Uses cane or walker    Referrals  Foot exam completed and c/o of pain below 2nd and 3rd toe which has been ongoing. States it is very painful when stepping on a rock etc. Agreed to have this evaluated and Dr. Yong Channel agreed to referral to Dr. [name] to evaluate  The patient will call and make an apt.  Patient concerns; Foot pain - referral in process fup on possible cholecystectomy surgery in process   Nurse Concerns; As noted   Next PCP apt Was seen today  I have personally reviewed and noted the following in the patient's chart:   . Medical and social history . Use of alcohol, tobacco or illicit drugs  . Current medications and supplements . Functional ability and status . Nutritional status . Physical activity . Advanced directives . List of other physicians . Hospitalizations, surgeries, and ER visits in previous 12 months . Vitals . Screenings to include cognitive, depression, and falls . Referrals and appointments  In addition, I have reviewed and discussed with patient certain preventive protocols, quality metrics, and best practice recommendations. A written personalized care plan for preventive services as well as general preventive health recommendations were provided to patient.     Wynetta Fines, RN  08/12/2017

## 2017-08-12 NOTE — Assessment & Plan Note (Signed)
Follows with [facility]- last GFR stable in 20s.

## 2017-08-12 NOTE — Assessment & Plan Note (Addendum)
S:  controlled. On 40 units of insulin- this morning blood sugar was 127 but yesterday wa s70- had sent message to go down to 35 units but he hasnt seen it yet Lab Results  Component Value Date   HGBA1C 5.6 06/18/2017   HGBA1C 8.2 (H) 01/28/2017   HGBA1C 6.8 03/26/2016   A/P: go down on lantus to 35 units. Follow up 2-3 months. Planning on starting silver sneakers soon.

## 2017-08-12 NOTE — Assessment & Plan Note (Signed)
Patient's symptoms very well controlled on zoloft 150mg . PHQ9 of 1 today with no SI with no anhedonia or depressed mood

## 2017-08-12 NOTE — Assessment & Plan Note (Signed)
S: Severe per patient report from prior physicians- yet he is on no inhalers. Does have SOB with exertion if pushes himself- he knows his stopping point though and wise enough to not push past this A/P: I did encourage patient to at least have albuterol on hand if needed for shortness of breath or wheeze- he agrees

## 2017-08-12 NOTE — Assessment & Plan Note (Signed)
Pacemaker in place. Has cardiology follow up next month.

## 2017-08-12 NOTE — Telephone Encounter (Signed)
AWV completed. Did not fup regarding LDCT but will check with Dr. Yong Channel if he feels this is necessary in lieu of his other imaging.  AAA neg on recent CT of abd and pelvis  Please advise, would be happy to call him and send out information sh

## 2017-08-12 NOTE — Telephone Encounter (Signed)
He is aged out of LDCT scan indications 55-80 and I believe medicare may only cover until 28.   Rodney Farley

## 2017-08-12 NOTE — Patient Instructions (Addendum)
go down on lantus to 35 units. Follow up 2-3 months  Thrilled Dr. Dema Severin is taking great care of you once again.   Would you be willing to see Manuela Schwartz, our wellness nurse today?  See me back in 2-3 months

## 2017-08-12 NOTE — Telephone Encounter (Signed)
Rodney Farley, please advise if you are able to close this encounter.  Thanks!

## 2017-08-13 NOTE — Progress Notes (Signed)
I have reviewed and agree with note, evaluation, plan. Thanks for referral to Dr. [name].   Garret Reddish, MD

## 2017-08-18 ENCOUNTER — Ambulatory Visit
Admission: RE | Admit: 2017-08-18 | Discharge: 2017-08-18 | Disposition: A | Discharge status: Home / Self Care | Payer: PPO | Source: Ambulatory Visit | Attending: Surgery | Admitting: Surgery

## 2017-08-18 DIAGNOSIS — R1011 Right upper quadrant pain: Secondary | ICD-10-CM

## 2017-08-18 DIAGNOSIS — K8020 Calculus of gallbladder without cholecystitis without obstruction: Secondary | ICD-10-CM | POA: Diagnosis not present

## 2017-08-25 ENCOUNTER — Ambulatory Visit (INDEPENDENT_AMBULATORY_CARE_PROVIDER_SITE_OTHER): Payer: PPO | Admitting: Sports Medicine

## 2017-08-25 ENCOUNTER — Encounter: Payer: Self-pay | Admitting: Sports Medicine

## 2017-08-25 VITALS — BP 148/84 | HR 70 | Ht 67.0 in | Wt 214.0 lb

## 2017-08-25 DIAGNOSIS — M79671 Pain in right foot: Secondary | ICD-10-CM

## 2017-08-25 DIAGNOSIS — M79672 Pain in left foot: Secondary | ICD-10-CM

## 2017-08-25 NOTE — Progress Notes (Signed)
  [name]. Rodney Farley, [place] [facility]  Rodney Farley - 84 y.o. male MRN [id number]  Date of birth: [birth year]  Visit Date: 08/25/2017  PCP: [name], MD   Referred by: [name], MD   Scribe for today's visit: Josepha Pigg, CMA     SUBJECTIVE:  Rodney Farley is here for bilateral foot pain .  Referred by: Garret Reddish, MD His bilateral foot pain symptoms INITIALLY: Began a few years ago and MOI is unknown. Pt is diabetic.  Described as severe sharp stabbing and throbbing pain. , nonradiating Worsened with walking barefoot, lying supine There are no alleviating factors.  Additional associated symptoms include: worse in 2nd and 3rd toes. The bottom of his feet are very tender to palpation. He feels a constant numbness in both feet. He has pain when lying in bed at night, the pain seems random and he is hesitant to move his feet.     At this time symptoms are worsening compared to onset. He has been taking Gabapentin with minimal relief, numbness has improved slightly.    ROS Reports night time disturbances. Reports fevers, chills, or night sweats. Denies unexplained weight loss. Denies personal history of cancer. Denies changes in bowel or bladder habits. Denies recent unreported falls. Denies new or worsening dyspnea or wheezing. Reports headaches or dizziness.  Reports numbness, tingling or weakness  In the extremities.  Denies dizziness or presyncopal episodes Reports lower extremity edema     HISTORY & PERTINENT PRIOR DATA:  Prior History reviewed and updated per electronic medical record.  Significant/pertinent history, findings, studies include:  reports that he quit smoking about 10 years ago. His smoking use included cigarettes. He has a 81.00 pack-year smoking history. He has never used smokeless tobacco.  No specialty comments available. No problems  updated.  OBJECTIVE:  VS:  HT:5\' 7"  (170.2 cm)   WT:214 lb (97.1 kg)  BMI:33.51    BP:(Abnormal) 148/84  HR:70bpm  TEMP: ( )  RESP:98 %   PHYSICAL EXAM: Constitutional: WDWN, Non-toxic appearing. Psychiatric: Alert & appropriately interactive.  Not depressed or anxious appearing. Respiratory: No increased work of breathing.  Trachea Midline Eyes: Pupils are equal.  EOM intact without nystagmus.  No scleral icterus  Vascular Exam: warm to touch no edema  lower extremity neuro exam: unremarkable  MSK Exam: Pulses are slightly diminished with poor hair distribution.  He has marked high cavus foot with no significant foot breakdown.  He does have a generalized dysesthesia with palpation but sensation is intact light touch throughout the entire toes.  No open wounds.  ASSESSMENT & PLAN:   1. Bilateral foot pain     PLAN: Recommend continuing with cushioning.  Can consider lower extremity vascular testing.  If any lack of improvement will recommend vascular ultrasound.  Otherwise continue with conditioning and ambulation per cardiac recommendations.  Follow-up: Return in about 3 weeks (around 09/15/2017).      Please see additional documentation for Objective, Assessment and Plan sections. Pertinent additional documentation may be included in corresponding procedure notes, imaging studies, problem based documentation and patient instructions. Please see these sections of the encounter for additional information regarding this visit.  CMA/ATC served as Education administrator during this visit. History, Physical, and Plan performed by medical provider. Documentation and orders reviewed and attested to.      [name], [facility] Physician

## 2017-08-25 NOTE — Patient Instructions (Signed)
3

## 2017-08-27 ENCOUNTER — Encounter: Payer: Self-pay | Admitting: Family Medicine

## 2017-08-27 DIAGNOSIS — E119 Type 2 diabetes mellitus without complications: Secondary | ICD-10-CM | POA: Diagnosis not present

## 2017-08-27 DIAGNOSIS — R1011 Right upper quadrant pain: Secondary | ICD-10-CM | POA: Diagnosis not present

## 2017-09-01 ENCOUNTER — Telehealth: Payer: Self-pay

## 2017-09-01 NOTE — Telephone Encounter (Signed)
   [facility] Pre-operative Risk Assessment    Request for surgical clearance:  1. What type of surgery is being performed? Gallbladder Removal   2. When is this surgery scheduled? TBD   3. What type of clearance is required (medical clearance vs. Pharmacy clearance to hold med vs. Both)? Medical Clearance   4. Are there any medications that need to be held prior to surgery and how long? We need instructions from you as to how the patient should hold medication preoperatively.  5. Practice name and name of physician performing surgery? [facility] , Dr. Nadeen Landau   6. What is your office phone and fax number? Phone: [phone] Fax: [phone]  7. Anesthesia type (None, local, MAC, general) ? General    Mendel Ryder 09/01/2017, 2:56 PM  _________________________________________________________________   (provider comments below)

## 2017-09-01 NOTE — Telephone Encounter (Signed)
   Primary Cardiologist: Fredrick [name], MD  Chart reviewed as part of pre-operative protocol coverage. Given past medical history and time since last visit, based on ACC/AHA guidelines, Rodney Farley would be at acceptable risk for the planned procedure without further cardiovascular testing. He is to be seen by Dr. [name] on 09/03/2017 for routine follow up appointment. He will confirm with the patient that he is okay to proceed with cholecystectomy.   I will route this recommendation to the requesting party via Epic fax function and remove from pre-op pool.  Please call with questions.  Jory Sims DNP, ANP, AACC   09/01/2017, 4:34 PM

## 2017-09-02 NOTE — Progress Notes (Signed)
CARDIOLOGY OFFICE NOTE  Date:  09/06/2017    Rodney Farley Date of Birth: 04-Jul-1932 Medical Record #[id number]  PCP:  [name], MD  Cardiologist:  Ifeoluwa [name]  MD  No chief complaint on file.   History of Present Illness: Rodney Farley is a 84 y.o. male who is seen for follow up CAD and CHF.   He has a history of CAD (s/p MI in 1994/1995 while in [place] s/p questionable PCI, , CHB 2010 s/p pacemaker, CKD stage IV, DM, HTN, HLD, remote tobacco abuse, COPD, moderate sleep apnea, and chronic systolic CHF.    In 2016 he had progression of DOE, chest discomfort and chest pounding with severe limitation on exertion. 2D Echo 09/2014 showed EF 35-40% with +WMA, grade 1 DD, mild MR. PFTs looked remarkably good. He underwent cardiac cath on 03/28/15 and Cr was 2.14 . LE duplex negative for DVT. Cath showed severe 3V CAD - the mid LAD, second diagonal, and third OM branches appeared suitable for PCI +/- distal LCx. The other option would be CABG. After discussion concerning risk and benefits hewas treated with PCI.   Patient underwent Multivessel PCI including stenting of the mid LAD, second diagonal, and third OM with DES on 04/09/15.  Distal 70% circumflex lesion  treated medically.He was started on aspirin and Plavix. Hydralazine was added for afterload reduction and better blood pressure control.   After his procedure he  had a marked improvement in his symptoms of dyspnea.  No chest pain. Breathing was much better. He had repeat Echo that still showed depressed EF 25-30% despite revascularization. He underwent upgrade of PPM to a BiV ICD device by Dr. Caryl Comes on 09/23/15. Follow up Echo in July 2017 showed normalization of EF.   ICD check in December 2018 showed no arrythmia and normal heart failure diagnostics. BiV paced.  He had a prolonged hospitalization from 9/14-9/25/2018 when he presented to the hospital with right lower quadrant abdominal pain and found to have  a ruptured appendicitis with abscess. Gen. Surgery was consulted and performed exploratory laparotomy with ileocecectomy on 03/27/2017. He received 10 days of antibiotics afterward. During the hospitalization, he also had acute on chronic renal insufficiency with peak creatinine of 5.75. However recent lab his creatinine o baseline of 2.44. Followed by nephrology with reported stable renal function with GFR in 20s. He is now being considered for cholecystectomy. He developed RUQ abdominal pain. CT in January showed large gallstones with a contracted gallbladder. This was confirmed on [place].   Today he is doing very well from a cardiac standpoint. He denies any chest pain, palpitations, dyspnea. He has some mild swelling in his legs at times. Uses lasix PRN. Since his surgery he has lost weight from 240 down to 216. Insulin requirements have decreased.   Past Medical History:  Diagnosis Date  . Anginal pain ([place])   . Anxiety   . Arm pain 05/08/2015   LEFT ARM  . Atrioventricular block, complete ([place])    a. 2010 s/p pacemaker.  . Bell's palsy    left side. after shingles episode  . Chronic systolic CHF (congestive heart failure) ([place])    a. LVEF 35-40% by echo 09/2014.  [name] CKD (chronic kidney disease), stage IV ([place])   . COPD (chronic obstructive pulmonary disease) ([facility])    Severe  . Coronary artery disease    a. s/p MI in 1994/1995 while in [place] s/p questionable PCI. 03/2015: progression of disease, for staged PCI.  [name]  Depression   . Diabetic peripheral neuropathy ([place])   . DOE (dyspnea on exertion) 03/28/2015  . GERD (gastroesophageal reflux disease)   . Hypercholesterolemia   . Hypertension   . MI (myocardial infarction) ([place]) 1994; 1995  . Neuropathy    IN LOWER EXTREMITIES  . Obesity   . Pacemaker    medtronic>>> MDT ICD 09/23/15  . Sleep apnea    "sleeps w/humidifyer when he panics and gets short of breath" (04/08/2015)  . TIA (transient ischemic attack) X 3  . Type II diabetes  mellitus ([place])     Past Surgical History:  Procedure Laterality Date  . CARDIAC CATHETERIZATION N/A 03/29/2015   Procedure: Right/Left Heart Cath and Coronary Angiography;  Surgeon: Anthonio M [name], MD;  Location: [facility];  Service: Cardiovascular;  Laterality: N/A;  . [place]   "after my MI; put me on heart RX after cath"  . CARDIAC CATHETERIZATION N/A 04/09/2015   Procedure: Coronary Stent Intervention;  Surgeon: Corneilus M [name], MD;  Location: [facility];  Service: Cardiovascular;  Laterality: N/A;  . CHOLECYSTECTOMY OPEN  ~ 1977  . EP IMPLANTABLE DEVICE N/A 09/23/2015   MDT CRT-D, Dr. Caryl Comes  . HIATAL HERNIA REPAIR  1977  . ILEOCECETOMY N/A 03/27/2017   Procedure: ILEOCECECTOMY;  Surgeon: Ileana Roup, MD;  Location: [facility];  Service: General;  Laterality: N/A;  . INSERT / REPLACE / REMOVE PACEMAKER  07/2008   Complete heart block status post DDD with good function  . LAPAROTOMY N/A 03/27/2017   Procedure: EXPLORATORY LAPAROTOMY;  Surgeon: Ileana Roup, MD;  Location: [facility];  Service: General;  Laterality: N/A;  . TONSILLECTOMY       Medications: Current Outpatient Medications  Medication Sig Dispense Refill  . albuterol (PROVENTIL HFA;VENTOLIN HFA) 108 (90 Base) MCG/ACT inhaler Inhale 2 puffs into the lungs every 6 (six) hours as needed for wheezing or shortness of breath. 1 Inhaler 2  . aspirin EC 81 MG tablet Take 81 mg by mouth daily.    [name] atorvastatin (LIPITOR) 40 MG tablet Take 1 tablet (40 mg total) by mouth daily. 90 tablet 3  . Blood Glucose Monitoring Suppl (FREESTYLE LITE) DEVI     . carvedilol (COREG) 6.25 MG tablet Take 1 tablet (6.25 mg total) by mouth 2 (two) times daily with a meal. 180 tablet 3  . furosemide (LASIX) 40 MG tablet Take 1 tablet (40 mg total) by mouth as needed (take as needed for swelling). 30 tablet 3  . gabapentin (NEURONTIN) 100 MG capsule Take 1 capsule (100 mg total) at bedtime by mouth. 90  capsule 1  . glucose blood (FREESTYLE TEST STRIPS) test strip Use to check blood sugar daily 100 each 3  . glucose monitoring kit (FREESTYLE) monitoring kit USE TO MONITOR BLOOD GLUCOSE AS DIRECTED 1 each 1  . Insulin Glargine (LANTUS SOLOSTAR) 100 UNIT/ML Solostar Pen Inject 60 Units into the skin daily at 10 pm. (Patient taking differently: Inject 40 Units into the skin daily at 10 pm. ) 5 pen PRN  . Insulin Pen Needle (UNIFINE PENTIPS) 31G X 5 MM MISC USE AS DIRECTED 100 each 0  . lisinopril (PRINIVIL,ZESTRIL) 2.5 MG tablet Take 1 tablet (2.5 mg total) by mouth daily. 90 tablet 3  . nitroGLYCERIN (NITROSTAT) 0.4 MG SL tablet Place 1 tablet (0.4 mg total) under the tongue every 5 (five) minutes as needed for chest pain. 2 doses before calling for emergent help 25 tablet 3  .  pantoprazole (PROTONIX) 40 MG tablet TAKE 1 TABLET BY MOUTH ONCE DAILY (Patient taking differently: TAKE 1 TABLET (40 MG) BY MOUTH ONCE DAILY) 90 tablet 3  . polyethylene glycol (MIRALAX / GLYCOLAX) packet Take 17 g by mouth daily. 14 each 0  . Polyethylene Glycol 400 (BLINK TEARS) 0.25 % SOLN Place 1 drop into both eyes 3 (three) times daily.    . sertraline (ZOLOFT) 100 MG tablet Take 1.5 tablets (150 mg total) by mouth daily. 135 tablet 1  . tamsulosin (FLOMAX) 0.4 MG CAPS capsule TAKE 1 CAPSULE BY MOUTH DAILY AFTER SUPPER (Patient taking differently: Take 0.4 mg by mouth daily after supper. ) 90 capsule 3  . triamcinolone cream (KENALOG) 0.1 % Apply 1 application topically 2 (two) times daily. For 7-10 days maximum 80 g 0  . vitamin B-12 (CYANOCOBALAMIN) 1000 MCG tablet Take 1,000 mcg by mouth daily.     No current facility-administered medications for this visit.     Allergies: Allergies  Allergen Reactions  . Bee Venom Anaphylaxis  . Lyrica [Pregabalin] Other (See Comments)    hallucinations  . Prednisone Other (See Comments)    hallucinations  . Zocor [Simvastatin] Nausea Only and Other (See Comments)     Headache with brand name only.  Can take the generic.    Social History: The patient  reports that he quit smoking about 10 years ago. His smoking use included cigarettes. He has a 81.00 pack-year smoking history. he has never used smokeless tobacco. He reports that he does not drink alcohol or use drugs.   Family History: [name] family history includes Heart attack in his brother; Leukemia in his father; Stroke in his mother and sister.   Review of Systems: Please see the history of present illness.   Otherwise, the review of systems is positive for none.   All other systems are reviewed and negative.   Physical Exam: VS:  BP (!) 144/76 (BP Location: Right Arm, Cuff Size: Normal)   Pulse 77   Ht '5\' 7"'$  (1.702 m)   Wt 216 lb (98 kg)   BMI 33.83 kg/m  .  BMI Body mass index is 33.83 kg/m.  Wt Readings from Last 3 Encounters:  09/06/17 216 lb (98 kg)  08/25/17 214 lb (97.1 kg)  08/12/17 218 lb 9.6 oz (99.2 kg)    GENERAL:  Well appearing WM in NAD HEENT:  PERRL, EOMI, sclera are clear. Oropharynx is clear. NECK:  No jugular venous distention, carotid upstroke brisk and symmetric, no bruits, no thyromegaly or adenopathy LUNGS:  Clear to auscultation bilaterally CHEST:  Unremarkable HEART:  RRR,  PMI not displaced or sustained,S1 and S2 within normal limits, no S3, no S4: no clicks, no rubs, no murmurs ABD:  Soft, nontender. BS +, no masses or bruits. Midline lower abdominal surgical wound has healed well.  EXT:  2 + pulses throughout, no edema, no cyanosis no clubbing SKIN:  Warm and dry.  No rashes NEURO:  Alert and oriented x 3. Cranial nerves II through XII intact. PSYCH:  Cognitively intact     LABORATORY DATA:  EKG:  EKG is not ordered today.  Lab Results  Component Value Date   WBC 11.0 (H) 07/20/2017   HGB 10.7 (L) 07/20/2017   HCT 33.0 (L) 07/20/2017   PLT 259.0 07/20/2017   GLUCOSE 93 07/27/2017   CHOL 171 06/26/2015   TRIG 317 (H) 06/26/2015   HDL  43 06/26/2015   LDLCALC 65 06/26/2015   ALT  16 07/20/2017   AST 13 07/20/2017   NA 140 07/27/2017   K 5.1 07/27/2017   CL 109 07/27/2017   CREATININE 2.47 (H) 07/27/2017   BUN 44 (H) 07/27/2017   CO2 23 07/27/2017   TSH 1.41 01/28/2017   PSA 0.55 09/19/2014   INR 1.05 01/10/2016   HGBA1C 5.6 06/18/2017   MICROALBUR 68.3 (H) 09/19/2014    BNP (last 3 results) No results for input(s): BNP in the last 8760 hours.  ProBNP (last 3 results) No results for input(s): PROBNP in the last 8760 hours.   Other Studies Reviewed Today:  Echo Study Conclusions from 01/23/16:  Study Conclusions  - Left ventricle: The cavity size was normal. Wall thickness was   increased in a pattern of mild LVH. Systolic function was normal.   The estimated ejection fraction was in the range of 60% to 65%.   Doppler parameters are consistent with abnormal left ventricular   relaxation (grade 1 diastolic dysfunction).  Assessment/Plan: 1. CAD - s/p  left heart catheterization revealing Mid LAD lesion, 90% stenosed, 2nd Diag lesion, 90% stenosed, 3rd Mrg lesion, 90% stenosed. He received drug-eluting stents to each lesion in September 2016. Distal 70% circumflex lesion is treated medically. He is asymptomatic.  He is on  aspirin - will plan for  long term. He is to continue lifestyle modification.  2. ICM EF 25-30%. S/p BiV ICD. Normal device function in December 2018. Echo in July 2017 showed normalization of EF with excellent response.   3. Chronic systolic HF -  He is on optimal therapy with Coreg, diuretics (prn), hydralazine, and nitrates. Not a candidate for ACEi/ARB or Entresto due to CKD.   4. HTN - controlled  5. HLD - on statin therapy with good LDL control.   6. Symptomatic gallstones. From a cardiac perspective he  Is an acceptable candidate for planned cholecystectomy.   Current medicines are reviewed with the patient today.  The patient does not have concerns regarding medicines other  than what has been noted above.   Disposition:   FU with Dr. [name] 6 months.   Patient is agreeable to this plan and will call if any problems develop in the interim.   Signed: Kollen [name] MD, [facility]    09/06/2017 11:18 AM

## 2017-09-06 ENCOUNTER — Ambulatory Visit: Payer: PPO | Admitting: Family Medicine

## 2017-09-06 ENCOUNTER — Encounter: Payer: Self-pay | Admitting: Family Medicine

## 2017-09-06 ENCOUNTER — Ambulatory Visit: Payer: PPO | Admitting: Cardiology

## 2017-09-06 ENCOUNTER — Encounter: Payer: Self-pay | Admitting: Cardiology

## 2017-09-06 VITALS — BP 144/76 | HR 77 | Ht 67.0 in | Wt 216.0 lb

## 2017-09-06 DIAGNOSIS — N184 Chronic kidney disease, stage 4 (severe): Secondary | ICD-10-CM | POA: Diagnosis not present

## 2017-09-06 DIAGNOSIS — I2510 Atherosclerotic heart disease of native coronary artery without angina pectoris: Secondary | ICD-10-CM

## 2017-09-06 DIAGNOSIS — I442 Atrioventricular block, complete: Secondary | ICD-10-CM

## 2017-09-06 DIAGNOSIS — I5022 Chronic systolic (congestive) heart failure: Secondary | ICD-10-CM | POA: Diagnosis not present

## 2017-09-06 DIAGNOSIS — Z950 Presence of cardiac pacemaker: Secondary | ICD-10-CM | POA: Diagnosis not present

## 2017-09-06 NOTE — Patient Instructions (Addendum)
Continue your current therapy  I will see you in 6 months.   

## 2017-09-08 ENCOUNTER — Ambulatory Visit (HOSPITAL_COMMUNITY)
Admission: RE | Admit: 2017-09-08 | Discharge: 2017-09-08 | Disposition: A | Discharge status: Home / Self Care | Payer: PPO | Source: Ambulatory Visit | Attending: Sports Medicine | Admitting: Sports Medicine

## 2017-09-08 DIAGNOSIS — M79671 Pain in right foot: Secondary | ICD-10-CM

## 2017-09-08 DIAGNOSIS — M79672 Pain in left foot: Secondary | ICD-10-CM

## 2017-09-08 DIAGNOSIS — R9439 Abnormal result of other cardiovascular function study: Secondary | ICD-10-CM | POA: Insufficient documentation

## 2017-09-10 NOTE — Progress Notes (Signed)
Notified [employer] Rep. , Dannial Monarch about needing to be here for patient's surgery on 10/13/2017 and she is aware and will be here.

## 2017-09-11 ENCOUNTER — Encounter: Payer: Self-pay | Admitting: Family Medicine

## 2017-09-13 ENCOUNTER — Ambulatory Visit: Payer: Self-pay | Admitting: Surgery

## 2017-09-13 NOTE — H&P (Signed)
CC: Intermittent RUQ pain HPI: Mr. Rodney Farley is a very pleasant 84 year old gentleman with history of hypertension, hyperlipidemia, diabetes (insulin-dependent), CHF, CAD who underwent ex-lap/ileocecectomy with ileocolic anastomosis 9/37/16 for perforated appendicitis with purulent peritonitis.  He had a prolonged recovery from this and was ultimately discharged to a skilled rehabilitation facility.  He had an open wound in the midline from surgery that was managed with a wound VAC and ultimately closed.  Over the last couple of months he has developed intermittent crampy right upper quadrant pain that sits just beneath his right rib cage.  He was seen in the emergency room for this 07/2017.  Laboratory evaluation is pertinent for a Rodney Farley of 13 and normal LFTs.  Underwent a CT scan of the abdomen and pelvis without contrast which demonstrated postoperative changes consistent with an ileocolic anastomosis as well as gallstones and scattered calcifications in the pancreas compatible with chronic pancreatitis.  He was seen back in the office and stated he had had a prior cholecystectomy and was unsure why his CT scan showed a gallbladder with stones and it.  He has some question whether or not there is a neck supple with his imaging but given his prior surgical history and ileocolic anastomosis which was present on his CAT scan I felt it was likely his CT scan.  To be sure, we obtain an ultrasound of the right upper quadrant 08/18/17 which demonstrated gallstones orders and which is 3.7 cm as well as some discomfort over the gallbladder.  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 Today, he reports still having intermittent crampy right upper quadrant pain.  He denies fevers/chills.  He denies nausea/vomiting and is tolerating a diet.  He reports having normal bowel movements.  PMH: HTN, DM, HLD, CHF, CKD, CAD s/p cardiac stents PSH: Open hiatal hernia repair many years ago;  exlap/ileocecectomy for perforated appendicitis 03/2017 FHx: Denies FHx of malignancy Social: Denies use of tobacco/EtOH/drugs ROS: A comprehensive 10 system review of systems was completed with the patient and pertinent findings as noted above.  The patient is a 84 year old male.   Allergies (Rodney Farley, CMA; 08/27/2017 2:56 PM) Lyrica *ANTICONVULSANTS*   PredniSONE *CORTICOSTEROIDS*   Zocor *ANTIHYPERLIPIDEMICS*   Allergies Reconciled    Medication History (Rodney Farley, CMA; 08/27/2017 2:56 PM) Carvedilol  (12.5MG  Tablet, Oral) Active. Lantus SoloStar  (100UNIT/ML Soln Pen-inj, Subcutaneous) Active. Tamsulosin HCl  (0.4MG  Capsule, Oral) Active. Aspirin  (81MG  Tablet, Oral) Active. Lisinopril  (2.5MG  Tablet, Oral) Active. Pantoprazole Sodium  (40MG  Tablet DR, Oral) Active. MiraLax  (Oral) Active. Atorvastatin Calcium  (40MG  Tablet, Oral) Active. Sertraline HCl  (100MG  Tablet, Oral) Active. Tamsulosin HCl  (0.4MG  Capsule, Oral) Active. Carvedilol  (6.25MG  Tablet, Oral) Active. Acetaminophen  (325MG  Tablet, Oral) Active. OxyCODONE HCl  (5MG  Tablet, Oral) Active. Nitrostat  (0.4MG  Tab Sublingual, Sublingual) Active. Medications Reconciled     Review of Systems Rodney Gave M. Saliha Salts MD; 08/27/2017 3:31 PM) General Not Present- Appetite Loss, Chills, Fatigue, Fever, Night Sweats, Weight Gain and Weight Loss. Skin Not Present- Change in Wart/Mole, Dryness, Hives, Jaundice, New Lesions, Non-Healing Wounds, Rash and Ulcer. HEENT Not Present- Earache, Hearing Loss, Hoarseness, Nose Bleed, Oral Ulcers, Ringing in the Ears, Seasonal Allergies, Sinus Pain, Sore Throat, Visual Disturbances, Wears glasses/contact lenses and Yellow Eyes. Respiratory Not Present- Bloody sputum, Chronic Cough, Difficulty Breathing, Snoring and Wheezing. Breast Not Present- Breast Mass, Breast Pain, Nipple Discharge and Skin Changes. Cardiovascular Not Present- Chest Pain, Difficulty Breathing Lying  Down, Leg Cramps, Palpitations,  Rapid Heart Rate, Shortness of Breath and Swelling of Extremities. Gastrointestinal Present- Abdominal Pain. Not Present- Bloating, Bloody Stool, Change in Bowel Habits, Chronic diarrhea, Constipation, Difficulty Swallowing, Excessive gas, Gets full quickly at meals, Hemorrhoids, Indigestion, Nausea, Rectal Pain and Vomiting. Male Genitourinary Not Present- Blood in Urine, Change in Urinary Stream, Frequency, Impotence, Nocturia, Painful Urination, Urgency and Urine Leakage. Neurological Not Present- Decreased Memory, Fainting, Headaches, Numbness, Seizures, Tingling, Tremor, Trouble walking and Weakness. Psychiatric Not Present- Anxiety, Bipolar, Change in Sleep Pattern, Depression, Fearful and Frequent crying. Endocrine Not Present- Cold Intolerance, Excessive Hunger, Hair Changes, Heat Intolerance and New Diabetes. Hematology Not Present- Blood Thinners, Easy Bruising, Excessive bleeding, Gland problems, HIV and Persistent Infections.  Vitals (Rodney Farley CMA; 08/27/2017 2:57 PM) 08/27/2017 2:56 PM Weight: 216.5 lb   Height: 67 in  Body Surface Area: 2.09 m   Body Mass Index: 33.91 kg/m   Temp.: 97.8 F    Pulse: 88 (Regular)    P.OX: 96% (Room air) BP: 154/92 (Sitting, Left Arm, Standard)       Physical Exam Rodney Gave M. Sokhna Christoph MD; 08/27/2017 3:31 PM) The physical exam findings are as follows: Note: Constitutional: No acute distress; conversant; no deformities Eyes: Moist conjunctiva; no lid lag; anicteric sclerae; pupils equal round and reactive to light Neck: Trachea midline; no palpable thyromegaly Lungs: Normal respiratory effort; no tactile fremitus CV: Regular rate and rhythm; no palpable thrill; no pitting edema GI: Abdomen - midline wound well healed; abd soft, nontender, nondistended; no palpable hepatosplenomegaly; negative Murphy's sign MSK: Normal gait; no clubbing/cyanosis Psychiatric: Appropriate affect; alert and oriented  3 Lymphatic: No palpable cervical or axillary lymphadenopathy    Assessment & Plan Rodney Gave M. Maleke Feria MD; 08/27/2017 3:35 PM) RIGHT UPPER QUADRANT PAIN (R10.11) Impression: Mr. Sawaya is a very pleasant 25yoM with hx of HTN, HLD, DM, CKD, CAD s/p stents with probable symptomatic cholelithiasis -Will proceed with obtaining cardiac clearance from Rodney Farley -The anatomy and physiology of the hepatobiliary tract was discussed at length with the patient. The pathophysiology of gallstones and gallbladder pathology was discussed at length with associated pictures and illustrations. -The planned procedure, material risks (including, but not limited to, pain, bleeding, infection, scarring, need for blood transfusion, damage to surrounding structures- blood vessels/nerves/viscus/organs, damage to bile duct, bile leak, need for additional procedures, hernia, pancreatitis, pneumonia, heart attack, stroke, death) benefits and alternatives to surgery were discussed at length. I noted a good probability that the procedure would help improve his symptoms. He and his daughter in [place] questions answered to their satisfaction, they voiced understanding and they elected to proceed with surgery. -Our pre-printed handout on cholecystectomy was additionally provided  Signed electronically by Rodney Roup, MD (08/27/2017 3:36 PM)

## 2017-09-15 ENCOUNTER — Encounter: Payer: Self-pay | Admitting: Sports Medicine

## 2017-09-15 ENCOUNTER — Ambulatory Visit (INDEPENDENT_AMBULATORY_CARE_PROVIDER_SITE_OTHER): Payer: PPO | Admitting: Sports Medicine

## 2017-09-15 VITALS — BP 146/80 | HR 75 | Ht 67.0 in | Wt 221.8 lb

## 2017-09-15 DIAGNOSIS — M65332 Trigger finger, left middle finger: Secondary | ICD-10-CM

## 2017-09-15 DIAGNOSIS — E1122 Type 2 diabetes mellitus with diabetic chronic kidney disease: Secondary | ICD-10-CM

## 2017-09-15 DIAGNOSIS — N184 Chronic kidney disease, stage 4 (severe): Secondary | ICD-10-CM | POA: Diagnosis not present

## 2017-09-15 DIAGNOSIS — Z794 Long term (current) use of insulin: Secondary | ICD-10-CM | POA: Diagnosis not present

## 2017-09-15 DIAGNOSIS — R1011 Right upper quadrant pain: Secondary | ICD-10-CM | POA: Diagnosis not present

## 2017-09-15 DIAGNOSIS — I10 Essential (primary) hypertension: Secondary | ICD-10-CM

## 2017-09-15 DIAGNOSIS — E1142 Type 2 diabetes mellitus with diabetic polyneuropathy: Secondary | ICD-10-CM

## 2017-09-15 DIAGNOSIS — M79672 Pain in left foot: Secondary | ICD-10-CM

## 2017-09-15 DIAGNOSIS — I5022 Chronic systolic (congestive) heart failure: Secondary | ICD-10-CM | POA: Diagnosis not present

## 2017-09-15 DIAGNOSIS — M79671 Pain in right foot: Secondary | ICD-10-CM

## 2017-09-15 NOTE — Progress Notes (Addendum)
Rodney Farley. Rodney Farley, [name] at [facility] [phone]  Rodney Farley - 84 y.o. male MRN [id number]  Date of birth: [birth year]  Visit Date: 09/15/2017  PCP: Rodney Olp, MD   Referred by: Rodney Olp, MD   Scribe for today's visit: Rodney Farley, CMA     SUBJECTIVE:  Rodney Farley is here for Follow-up (bilateral foot pain)  08/25/17: His bilateral foot pain symptoms INITIALLY: Began a few years ago and MOI is unknown. Pt is diabetic.  Described as severe sharp stabbing and throbbing pain. , nonradiating Worsened with walking barefoot, lying supine There are no alleviating factors.  Additional associated symptoms include: worse in 2nd and 3rd toes. The bottom of his feet are very tender to palpation. He feels a constant numbness in both feet. He has pain when lying in bed at night, the pain seems random and he is hesitant to move his feet.  At this time symptoms are worsening compared to onset. He has been taking Gabapentin with minimal relief, numbness has improved slightly.   09/15/17: Compared to the last office visit, his previously described symptoms are improving, he feels that he is walking better.  Current symptoms are mild & are nonradiating He has been taking Gabapentin nightly. He has been wearing pads in his shoes, he has found these to be uncomfortable.   He reports 3 nosebleeds over the past 2 weeks varying in length from 1.5 hours-5 hours. This is new for him.   Pt scheduled for cholecystectomy 10/13/17.    ROS Reports night time disturbances. Reports fevers, chills, or night sweats - reports recent cold. Denies unexplained weight loss. Denies personal history of cancer. Reports changes in bowel or bladder habits, BMs have been loose and yellow and have a bad smell. Denies recent unreported falls but does feel unsteady when turning L or R. Denies new or worsening dyspnea or wheezing. Reports  headaches or dizziness.  Reports numbness, tingling or weakness  In the extremities.  Denies dizziness or presyncopal episodes Reports lower extremity edema    HISTORY & PERTINENT PRIOR DATA:  Prior History reviewed and updated per electronic medical record.  Significant history, findings, studies and interim changes include:  reports that he quit smoking about 10 years ago. His smoking use included cigarettes. He has a 81.00 pack-year smoking history. he has never used smokeless tobacco. Recent Labs    01/28/17 1328 06/18/17 1555  HGBA1C 8.2* 5.6   No specialty comments available. No problems updated.  OBJECTIVE:  VS:  HT:5\' 7"  (170.2 cm)   WT:221 lb 12.8 oz (100.6 kg)  BMI:34.73    BP:(!) 146/80  HR:75bpm  TEMP: ( )  RESP:97 %   PHYSICAL EXAM: Constitutional: WDWN, Non-toxic appearing. Psychiatric: Alert & appropriately interactive.  Not depressed or anxious appearing. Respiratory: No increased work of breathing.  Trachea Midline Eyes: Pupils are equal.  EOM intact without nystagmus.  No scleral icterus  NEUROVASCULAR exam: Bilateral lower extremities have 2+ pitting edema today.  He has very faintly palpable DP and PT pulses bilaterally.  Capillary refill is 4-5 seconds in his toes bilaterally.  He has only mild discomfort today with palpation of his feet and ankles.  He no focal bony tenderness.  Left hand has pain with palpation of the A1 pulley over the third finger.  He has a center brace and does report locking and clicking with this but at this time is only mild and is  comfortable with wearing the brace.  ASSESSMENT & PLAN:   1. Controlled type 2 diabetes mellitus with stage 4 chronic kidney disease, with long-term current use of insulin ([place])   2. Diabetic peripheral neuropathy ([place])   3. Trigger middle finger of left hand   4. Essential hypertension   5. Bilateral foot pain   6. Chronic kidney disease (CKD), stage IV (severe) ([place])   7. Chronic systolic CHF  (congestive heart failure) ([place])   8. Right upper quadrant abdominal pain    ++++++++++++++++++++++++++++++++++++++++++++ Orders & Meds: No orders of the defined types were placed in this encounter.  No orders of the defined types were placed in this encounter.   ++++++++++++++++++++++++++++++++++++++++++++ PLAN: Regarding his lower extremity pain discomfort and difficulty with ambulation this is actually significantly improved on its own since seeing him last.  He does have some slight abnormality on his ABIs is more consistent with microvascular disease which is not surprising is diabetic.  Interestingly on his ABIs he had markedly elevated systolic pressures however at this time during the office visit and during his recent cardiology visit they have been much improved.  I do question if he is having labile blood pressures at home that is contributing to his malaise and generalized discomfort.  He is slightly volume overloaded today and this may be a contributing factor but given his blood pressure is fine I do not think that any further medication titrations are indicated.  I did recommend that he check his blood pressure on a regular basis at home 3-4 times per day and let myself know what they are since Dr. Yong Channel is out of the office.  If persistently elevated or markedly labile can consider an ambulatory blood pressure monitor this can be deferred until after his cholecystectomy that is coming up.  I am happy to see him back at any time for his persistent trigger finger and discussed that continued injections for trigger finger can potentially cause tendon rupture and he is aware of this but would like to avoid surgery at this time at all costs.  He will plan to call us if any lack of improvement and lessening and will be sending in blood pressures in the next several weeks for review.   Follow-up: Return if symptoms worsen or fail to improve.   Pertinent documentation may be included in  additional procedure notes, imaging studies, problem based documentation and patient instructions. Please see these sections of the encounter for additional information regarding this visit. CMA/ATC served as Education administrator during this visit. History, Physical, and Plan performed by medical provider. Documentation and orders reviewed and attested to.      [name], [facility] Physician

## 2017-09-15 NOTE — Progress Notes (Signed)
Non interventional findings. Marked elevation in systolic BPs.  Only mildly abnormal TBI consistent with microvascular disease

## 2017-09-20 ENCOUNTER — Ambulatory Visit (INDEPENDENT_AMBULATORY_CARE_PROVIDER_SITE_OTHER): Payer: PPO | Admitting: *Deleted

## 2017-09-20 DIAGNOSIS — I255 Ischemic cardiomyopathy: Secondary | ICD-10-CM

## 2017-09-20 NOTE — Progress Notes (Signed)
Remote ICD transmission.   

## 2017-09-22 ENCOUNTER — Encounter: Payer: Self-pay | Admitting: Cardiology

## 2017-09-23 LAB — CUP PACEART REMOTE DEVICE CHECK
Battery Remaining Longevity: 41 mo
Battery Voltage: 2.96 V
Brady Statistic AP VP Percent: 85.01 %
Brady Statistic RA Percent Paced: 83.81 %
Brady Statistic RV Percent Paced: 98.81 %
HIGH POWER IMPEDANCE MEASURED VALUE: 61 Ohm
Implantable Lead Implant Date: 20170313
Implantable Lead Location: 753859
Implantable Lead Model: 4598
Implantable Lead Model: 5076
Lead Channel Impedance Value: 342 Ohm
Lead Channel Impedance Value: 342 Ohm
Lead Channel Impedance Value: 399 Ohm
Lead Channel Impedance Value: 551 Ohm
Lead Channel Impedance Value: 589 Ohm
Lead Channel Impedance Value: 646 Ohm
Lead Channel Pacing Threshold Amplitude: 0.625 V
Lead Channel Pacing Threshold Pulse Width: 0.4 ms
Lead Channel Sensing Intrinsic Amplitude: 1.375 mV
Lead Channel Sensing Intrinsic Amplitude: 13.5 mV
Lead Channel Sensing Intrinsic Amplitude: 13.5 mV
Lead Channel Setting Pacing Amplitude: 2.5 V
Lead Channel Setting Pacing Amplitude: 2.5 V
Lead Channel Setting Pacing Amplitude: 2.75 V
Lead Channel Setting Pacing Pulse Width: 0.4 ms
MDC IDC LEAD IMPLANT DT: 20100112
MDC IDC LEAD IMPLANT DT: 20170313
MDC IDC LEAD LOCATION: 753858
MDC IDC LEAD LOCATION: 753860
MDC IDC MSMT LEADCHNL LV IMPEDANCE VALUE: 342 Ohm
MDC IDC MSMT LEADCHNL LV IMPEDANCE VALUE: 342 Ohm
MDC IDC MSMT LEADCHNL LV IMPEDANCE VALUE: 399 Ohm
MDC IDC MSMT LEADCHNL LV IMPEDANCE VALUE: 608 Ohm
MDC IDC MSMT LEADCHNL LV IMPEDANCE VALUE: 608 Ohm
MDC IDC MSMT LEADCHNL LV PACING THRESHOLD AMPLITUDE: 1.25 V
MDC IDC MSMT LEADCHNL LV PACING THRESHOLD PULSEWIDTH: 0.4 ms
MDC IDC MSMT LEADCHNL RA IMPEDANCE VALUE: 361 Ohm
MDC IDC MSMT LEADCHNL RA PACING THRESHOLD AMPLITUDE: 1.25 V
MDC IDC MSMT LEADCHNL RA SENSING INTR AMPL: 1.375 mV
MDC IDC MSMT LEADCHNL RV IMPEDANCE VALUE: 418 Ohm
MDC IDC MSMT LEADCHNL RV PACING THRESHOLD PULSEWIDTH: 0.4 ms
MDC IDC PG IMPLANT DT: 20170313
MDC IDC SESS DTM: 20190311083823
MDC IDC SET LEADCHNL RV PACING PULSEWIDTH: 0.4 ms
MDC IDC SET LEADCHNL RV SENSING SENSITIVITY: 0.3 mV
MDC IDC STAT BRADY AP VS PERCENT: 0.25 %
MDC IDC STAT BRADY AS VP PERCENT: 14.48 %
MDC IDC STAT BRADY AS VS PERCENT: 0.25 %

## 2017-09-26 ENCOUNTER — Encounter: Payer: Self-pay | Admitting: Family Medicine

## 2017-10-01 ENCOUNTER — Encounter (HOSPITAL_COMMUNITY): Payer: Self-pay

## 2017-10-01 NOTE — Pre-Procedure Instructions (Signed)
The following are in epic: Cardiac clearance Dr. [name] 09/06/2017 Pacer remote check 09/20/2017 Last office visit note Dr. [name] 09/15/2017 EKG 03/26/2017 [place] abd/pelvis 08/18/2017 Vascular US 09/08/2017

## 2017-10-01 NOTE — Patient Instructions (Addendum)
Your procedure is scheduled on: Wednesday, October 13, 2017   Surgery Time:  12:00PM-4:30PM   Report to [place]  Entrance    Report to admitting at 10:00 AM   Call this number if you have problems the morning of surgery [phone]   Do not eat food or drink liquids :After Midnight.   Do NOT smoke after Midnight   Take these medicines the morning of surgery with A SIP OF WATER:Carvedilol, Pantoprazole, Sertraline   Take half (1/2) Lantus insulin the night before surgery   Bring Asthma Inhaler day of surgery   DO NOT TAKE ANY DIABETIC MEDICATIONS DAY OF YOUR SURGERY                               You may not have any metal on your body including hair pins, jewelry, and body piercings             Do not wear make-up, lotions, powders, perfumes/cologne, or deodorant             Do not wear nail polish.  Do not shave  48 hours prior to surgery.              Men may shave face and neck.   Do not bring valuables to the hospital. [place].   Contacts, dentures or bridgework may not be worn into surgery.   Leave suitcase in the car. After surgery it may be brought to your room.   Special Instructions: Bring a copy of your healthcare power of attorney and living will documents         the day of surgery if you haven't scanned them in before.              Please read over the following fact sheets you were given:  [facility] - Preparing for Surgery Before surgery, you can play an important role.  Because skin is not sterile, your skin needs to be as free of germs as possible.  You can reduce the number of germs on your skin by washing with CHG (chlorahexidine gluconate) soap before surgery.  CHG is an antiseptic cleaner which kills germs and bonds with the skin to continue killing germs even after washing. Please DO NOT use if you have an allergy to CHG or antibacterial soaps.  If your skin becomes  reddened/irritated stop using the CHG and inform your nurse when you arrive at Short Stay. Do not shave (including legs and underarms) for at least 48 hours prior to the first CHG shower.  You may shave your face/neck.  Please follow these instructions carefully:  1.  Shower with CHG Soap the night before surgery and the  morning of surgery.  2.  If you choose to wash your hair, wash your hair first as usual with your normal  shampoo.  3.  After you shampoo, rinse your hair and body thoroughly to remove the shampoo.                             4.  Use CHG as you would any other liquid soap.  You can apply chg directly to the skin and wash.  Gently with a scrungie or clean washcloth.  5.  Apply the  CHG Soap to your body ONLY FROM THE NECK DOWN.   Do   not use on face/ open                           Wound or open sores. Avoid contact with eyes, ears mouth and   genitals (private parts).                       Wash face,  Genitals (private parts) with your normal soap.             6.  Wash thoroughly, paying special attention to the area where your    surgery  will be performed.  7.  Thoroughly rinse your body with warm water from the neck down.  8.  DO NOT shower/wash with your normal soap after using and rinsing off the CHG Soap.                9.  Pat yourself dry with a clean towel.            10.  Wear clean pajamas.            11.  Place clean sheets on your bed the night of your first shower and do not  sleep with pets. Day of Surgery : Do not apply any lotions/deodorants the morning of surgery.  Please wear clean clothes to the hospital/surgery center.  FAILURE TO FOLLOW THESE INSTRUCTIONS MAY RESULT IN THE CANCELLATION OF YOUR SURGERY  PATIENT SIGNATURE_________________________________  NURSE SIGNATURE__________________________________  ________________________________________________________________________

## 2017-10-06 DIAGNOSIS — R040 Epistaxis: Secondary | ICD-10-CM

## 2017-10-06 HISTORY — DX: Epistaxis: R04.0

## 2017-10-07 ENCOUNTER — Encounter (HOSPITAL_COMMUNITY): Payer: Self-pay

## 2017-10-07 ENCOUNTER — Ambulatory Visit (HOSPITAL_COMMUNITY)
Admission: RE | Admit: 2017-10-07 | Discharge: 2017-10-07 | Disposition: A | Discharge status: Home / Self Care | Payer: PPO | Source: Ambulatory Visit | Attending: Anesthesiology | Admitting: Anesthesiology

## 2017-10-07 ENCOUNTER — Encounter (HOSPITAL_COMMUNITY)
Admission: RE | Admit: 2017-10-07 | Discharge: 2017-10-07 | Disposition: A | Discharge status: Home / Self Care | Payer: PPO | Source: Ambulatory Visit | Attending: Surgery | Admitting: Surgery

## 2017-10-07 ENCOUNTER — Other Ambulatory Visit: Payer: Self-pay

## 2017-10-07 DIAGNOSIS — K8020 Calculus of gallbladder without cholecystitis without obstruction: Secondary | ICD-10-CM | POA: Diagnosis not present

## 2017-10-07 DIAGNOSIS — Z7982 Long term (current) use of aspirin: Secondary | ICD-10-CM | POA: Insufficient documentation

## 2017-10-07 DIAGNOSIS — K219 Gastro-esophageal reflux disease without esophagitis: Secondary | ICD-10-CM | POA: Diagnosis not present

## 2017-10-07 DIAGNOSIS — I130 Hypertensive heart and chronic kidney disease with heart failure and stage 1 through stage 4 chronic kidney disease, or unspecified chronic kidney disease: Secondary | ICD-10-CM | POA: Insufficient documentation

## 2017-10-07 DIAGNOSIS — E1122 Type 2 diabetes mellitus with diabetic chronic kidney disease: Secondary | ICD-10-CM | POA: Insufficient documentation

## 2017-10-07 DIAGNOSIS — G4730 Sleep apnea, unspecified: Secondary | ICD-10-CM | POA: Diagnosis not present

## 2017-10-07 DIAGNOSIS — I2510 Atherosclerotic heart disease of native coronary artery without angina pectoris: Secondary | ICD-10-CM | POA: Insufficient documentation

## 2017-10-07 DIAGNOSIS — I5022 Chronic systolic (congestive) heart failure: Secondary | ICD-10-CM | POA: Diagnosis not present

## 2017-10-07 DIAGNOSIS — Z794 Long term (current) use of insulin: Secondary | ICD-10-CM | POA: Diagnosis not present

## 2017-10-07 DIAGNOSIS — Z01818 Encounter for other preprocedural examination: Secondary | ICD-10-CM

## 2017-10-07 DIAGNOSIS — Z79899 Other long term (current) drug therapy: Secondary | ICD-10-CM | POA: Diagnosis not present

## 2017-10-07 DIAGNOSIS — N184 Chronic kidney disease, stage 4 (severe): Secondary | ICD-10-CM | POA: Insufficient documentation

## 2017-10-07 DIAGNOSIS — E1142 Type 2 diabetes mellitus with diabetic polyneuropathy: Secondary | ICD-10-CM | POA: Diagnosis not present

## 2017-10-07 DIAGNOSIS — I255 Ischemic cardiomyopathy: Secondary | ICD-10-CM | POA: Diagnosis not present

## 2017-10-07 DIAGNOSIS — J449 Chronic obstructive pulmonary disease, unspecified: Secondary | ICD-10-CM | POA: Insufficient documentation

## 2017-10-07 DIAGNOSIS — Z950 Presence of cardiac pacemaker: Secondary | ICD-10-CM | POA: Diagnosis not present

## 2017-10-07 HISTORY — DX: Cyst of kidney, acquired: N28.1

## 2017-10-07 HISTORY — DX: Personal history of other infectious and parasitic diseases: Z86.19

## 2017-10-07 HISTORY — DX: Diarrhea, unspecified: R19.7

## 2017-10-07 HISTORY — DX: Trigger finger, left middle finger: M65.332

## 2017-10-07 HISTORY — DX: Personal history of other diseases of the digestive system: Z87.19

## 2017-10-07 HISTORY — DX: Benign prostatic hyperplasia with lower urinary tract symptoms: N40.1

## 2017-10-07 HISTORY — DX: Acute kidney failure, unspecified: N17.9

## 2017-10-07 HISTORY — DX: Ischemic cardiomyopathy: I25.5

## 2017-10-07 HISTORY — DX: Calculus of gallbladder without cholecystitis without obstruction: K80.20

## 2017-10-07 HISTORY — DX: Acute appendicitis with perforation, localized peritonitis, and gangrene, without abscess: K35.32

## 2017-10-07 HISTORY — DX: Nocturia: R35.1

## 2017-10-07 HISTORY — DX: Diverticulitis of intestine, part unspecified, without perforation or abscess without bleeding: K57.92

## 2017-10-07 HISTORY — DX: Gout, unspecified: M10.9

## 2017-10-07 HISTORY — DX: Unspecified hearing loss, unspecified ear: H91.90

## 2017-10-07 HISTORY — DX: Cardiomegaly: I51.7

## 2017-10-07 HISTORY — DX: Epistaxis: R04.0

## 2017-10-07 HISTORY — DX: Presence of automatic (implantable) cardiac defibrillator: Z95.810

## 2017-10-07 HISTORY — DX: Anemia, unspecified: D64.9

## 2017-10-07 LAB — CBC
HCT: 33.4 % — ABNORMAL LOW (ref 39.0–52.0)
Hemoglobin: 10.7 g/dL — ABNORMAL LOW (ref 13.0–17.0)
MCH: 29.6 pg (ref 26.0–34.0)
MCHC: 32 g/dL (ref 30.0–36.0)
MCV: 92.3 fL (ref 78.0–100.0)
PLATELETS: 230 10*3/uL (ref 150–400)
RBC: 3.62 MIL/uL — AB (ref 4.22–5.81)
RDW: 16.1 % — ABNORMAL HIGH (ref 11.5–15.5)
WBC: 8.4 10*3/uL (ref 4.0–10.5)

## 2017-10-07 LAB — BASIC METABOLIC PANEL
Anion gap: 9 (ref 5–15)
BUN: 38 mg/dL — AB (ref 6–20)
CO2: 23 mmol/L (ref 22–32)
Calcium: 8.8 mg/dL — ABNORMAL LOW (ref 8.9–10.3)
Chloride: 110 mmol/L (ref 101–111)
Creatinine, Ser: 2.72 mg/dL — ABNORMAL HIGH (ref 0.61–1.24)
GFR, EST AFRICAN AMERICAN: 23 mL/min — AB (ref >60)
GFR, EST NON AFRICAN AMERICAN: 20 mL/min — AB (ref >60)
Glucose, Bld: 110 mg/dL — ABNORMAL HIGH (ref 65–99)
POTASSIUM: 5.2 mmol/L — AB (ref 3.5–5.1)
SODIUM: 142 mmol/L (ref 135–145)

## 2017-10-07 LAB — GLUCOSE, CAPILLARY: Glucose-Capillary: 104 mg/dL — ABNORMAL HIGH (ref 65–99)

## 2017-10-07 LAB — HEMOGLOBIN A1C
HEMOGLOBIN A1C: 5.6 % (ref 4.8–5.6)
Mean Plasma Glucose: 114.02 mg/dL

## 2017-10-07 NOTE — Pre-Procedure Instructions (Signed)
Dr. Kalman Shan made aware of  CXR and BMP results 10/07/2017.  No new orders received at this time.

## 2017-10-07 NOTE — Pre-Procedure Instructions (Signed)
CBC, BMP, CXR results 10/07/2017 faxed to Dr. Dema Severin via epic.

## 2017-10-13 ENCOUNTER — Other Ambulatory Visit: Payer: Self-pay

## 2017-10-13 ENCOUNTER — Encounter (HOSPITAL_COMMUNITY)
Admission: RE | Disposition: A | Discharge status: Home / Self Care | Payer: Self-pay | Source: Ambulatory Visit | Attending: Surgery

## 2017-10-13 ENCOUNTER — Inpatient Hospital Stay (HOSPITAL_COMMUNITY)
Admission: RE | Admit: 2017-10-13 | Discharge: 2017-10-17 | DRG: 418 | Disposition: A | Discharge status: Home / Self Care | Payer: PPO | Source: Ambulatory Visit | Attending: Internal Medicine | Admitting: Internal Medicine

## 2017-10-13 ENCOUNTER — Encounter (HOSPITAL_COMMUNITY): Payer: Self-pay | Admitting: *Deleted

## 2017-10-13 ENCOUNTER — Ambulatory Visit (HOSPITAL_COMMUNITY): Payer: PPO | Admitting: Anesthesiology

## 2017-10-13 DIAGNOSIS — E785 Hyperlipidemia, unspecified: Secondary | ICD-10-CM | POA: Diagnosis not present

## 2017-10-13 DIAGNOSIS — E1142 Type 2 diabetes mellitus with diabetic polyneuropathy: Secondary | ICD-10-CM | POA: Diagnosis present

## 2017-10-13 DIAGNOSIS — R040 Epistaxis: Secondary | ICD-10-CM | POA: Diagnosis not present

## 2017-10-13 DIAGNOSIS — K8010 Calculus of gallbladder with chronic cholecystitis without obstruction: Secondary | ICD-10-CM | POA: Diagnosis not present

## 2017-10-13 DIAGNOSIS — M25512 Pain in left shoulder: Secondary | ICD-10-CM | POA: Diagnosis not present

## 2017-10-13 DIAGNOSIS — E1159 Type 2 diabetes mellitus with other circulatory complications: Secondary | ICD-10-CM | POA: Diagnosis present

## 2017-10-13 DIAGNOSIS — I10 Essential (primary) hypertension: Secondary | ICD-10-CM | POA: Diagnosis not present

## 2017-10-13 DIAGNOSIS — Z823 Family history of stroke: Secondary | ICD-10-CM

## 2017-10-13 DIAGNOSIS — J449 Chronic obstructive pulmonary disease, unspecified: Secondary | ICD-10-CM | POA: Diagnosis present

## 2017-10-13 DIAGNOSIS — E11649 Type 2 diabetes mellitus with hypoglycemia without coma: Secondary | ICD-10-CM | POA: Diagnosis not present

## 2017-10-13 DIAGNOSIS — Z955 Presence of coronary angioplasty implant and graft: Secondary | ICD-10-CM

## 2017-10-13 DIAGNOSIS — R079 Chest pain, unspecified: Secondary | ICD-10-CM | POA: Diagnosis not present

## 2017-10-13 DIAGNOSIS — K5792 Diverticulitis of intestine, part unspecified, without perforation or abscess without bleeding: Secondary | ICD-10-CM | POA: Diagnosis present

## 2017-10-13 DIAGNOSIS — K8020 Calculus of gallbladder without cholecystitis without obstruction: Principal | ICD-10-CM | POA: Diagnosis present

## 2017-10-13 DIAGNOSIS — K219 Gastro-esophageal reflux disease without esophagitis: Secondary | ICD-10-CM | POA: Diagnosis present

## 2017-10-13 DIAGNOSIS — Z8249 Family history of ischemic heart disease and other diseases of the circulatory system: Secondary | ICD-10-CM

## 2017-10-13 DIAGNOSIS — I25119 Atherosclerotic heart disease of native coronary artery with unspecified angina pectoris: Secondary | ICD-10-CM | POA: Diagnosis present

## 2017-10-13 DIAGNOSIS — I5042 Chronic combined systolic (congestive) and diastolic (congestive) heart failure: Secondary | ICD-10-CM | POA: Diagnosis present

## 2017-10-13 DIAGNOSIS — I252 Old myocardial infarction: Secondary | ICD-10-CM

## 2017-10-13 DIAGNOSIS — I255 Ischemic cardiomyopathy: Secondary | ICD-10-CM | POA: Diagnosis not present

## 2017-10-13 DIAGNOSIS — F329 Major depressive disorder, single episode, unspecified: Secondary | ICD-10-CM | POA: Diagnosis present

## 2017-10-13 DIAGNOSIS — D631 Anemia in chronic kidney disease: Secondary | ICD-10-CM | POA: Diagnosis present

## 2017-10-13 DIAGNOSIS — I25708 Atherosclerosis of coronary artery bypass graft(s), unspecified, with other forms of angina pectoris: Secondary | ICD-10-CM | POA: Diagnosis present

## 2017-10-13 DIAGNOSIS — Z7982 Long term (current) use of aspirin: Secondary | ICD-10-CM

## 2017-10-13 DIAGNOSIS — R7989 Other specified abnormal findings of blood chemistry: Secondary | ICD-10-CM | POA: Diagnosis present

## 2017-10-13 DIAGNOSIS — E1122 Type 2 diabetes mellitus with diabetic chronic kidney disease: Secondary | ICD-10-CM | POA: Diagnosis not present

## 2017-10-13 DIAGNOSIS — Z87891 Personal history of nicotine dependence: Secondary | ICD-10-CM

## 2017-10-13 DIAGNOSIS — Z806 Family history of leukemia: Secondary | ICD-10-CM

## 2017-10-13 DIAGNOSIS — G4730 Sleep apnea, unspecified: Secondary | ICD-10-CM | POA: Diagnosis present

## 2017-10-13 DIAGNOSIS — Z9049 Acquired absence of other specified parts of digestive tract: Secondary | ICD-10-CM

## 2017-10-13 DIAGNOSIS — I5022 Chronic systolic (congestive) heart failure: Secondary | ICD-10-CM | POA: Diagnosis present

## 2017-10-13 DIAGNOSIS — I25110 Atherosclerotic heart disease of native coronary artery with unstable angina pectoris: Secondary | ICD-10-CM | POA: Diagnosis not present

## 2017-10-13 DIAGNOSIS — I130 Hypertensive heart and chronic kidney disease with heart failure and stage 1 through stage 4 chronic kidney disease, or unspecified chronic kidney disease: Secondary | ICD-10-CM | POA: Diagnosis not present

## 2017-10-13 DIAGNOSIS — I25118 Atherosclerotic heart disease of native coronary artery with other forms of angina pectoris: Secondary | ICD-10-CM | POA: Diagnosis not present

## 2017-10-13 DIAGNOSIS — Z91030 Bee allergy status: Secondary | ICD-10-CM

## 2017-10-13 DIAGNOSIS — R748 Abnormal levels of other serum enzymes: Secondary | ICD-10-CM | POA: Diagnosis not present

## 2017-10-13 DIAGNOSIS — N186 End stage renal disease: Secondary | ICD-10-CM | POA: Diagnosis present

## 2017-10-13 DIAGNOSIS — N401 Enlarged prostate with lower urinary tract symptoms: Secondary | ICD-10-CM | POA: Diagnosis not present

## 2017-10-13 DIAGNOSIS — E669 Obesity, unspecified: Secondary | ICD-10-CM | POA: Diagnosis present

## 2017-10-13 DIAGNOSIS — Z992 Dependence on renal dialysis: Secondary | ICD-10-CM | POA: Diagnosis present

## 2017-10-13 DIAGNOSIS — Z95810 Presence of automatic (implantable) cardiac defibrillator: Secondary | ICD-10-CM

## 2017-10-13 DIAGNOSIS — Z6833 Body mass index (BMI) 33.0-33.9, adult: Secondary | ICD-10-CM

## 2017-10-13 DIAGNOSIS — K660 Peritoneal adhesions (postprocedural) (postinfection): Secondary | ICD-10-CM | POA: Diagnosis present

## 2017-10-13 DIAGNOSIS — Z888 Allergy status to other drugs, medicaments and biological substances status: Secondary | ICD-10-CM

## 2017-10-13 DIAGNOSIS — I442 Atrioventricular block, complete: Secondary | ICD-10-CM | POA: Diagnosis not present

## 2017-10-13 DIAGNOSIS — E7800 Pure hypercholesterolemia, unspecified: Secondary | ICD-10-CM | POA: Diagnosis present

## 2017-10-13 DIAGNOSIS — Z794 Long term (current) use of insulin: Secondary | ICD-10-CM

## 2017-10-13 DIAGNOSIS — H9190 Unspecified hearing loss, unspecified ear: Secondary | ICD-10-CM | POA: Diagnosis present

## 2017-10-13 DIAGNOSIS — I214 Non-ST elevation (NSTEMI) myocardial infarction: Secondary | ICD-10-CM | POA: Diagnosis not present

## 2017-10-13 DIAGNOSIS — M542 Cervicalgia: Secondary | ICD-10-CM | POA: Diagnosis not present

## 2017-10-13 DIAGNOSIS — F419 Anxiety disorder, unspecified: Secondary | ICD-10-CM | POA: Diagnosis present

## 2017-10-13 DIAGNOSIS — M109 Gout, unspecified: Secondary | ICD-10-CM | POA: Diagnosis not present

## 2017-10-13 DIAGNOSIS — I209 Angina pectoris, unspecified: Secondary | ICD-10-CM | POA: Diagnosis present

## 2017-10-13 DIAGNOSIS — I4891 Unspecified atrial fibrillation: Secondary | ICD-10-CM | POA: Diagnosis present

## 2017-10-13 DIAGNOSIS — I248 Other forms of acute ischemic heart disease: Secondary | ICD-10-CM | POA: Diagnosis not present

## 2017-10-13 DIAGNOSIS — N184 Chronic kidney disease, stage 4 (severe): Secondary | ICD-10-CM

## 2017-10-13 DIAGNOSIS — I340 Nonrheumatic mitral (valve) insufficiency: Secondary | ICD-10-CM | POA: Diagnosis not present

## 2017-10-13 DIAGNOSIS — K861 Other chronic pancreatitis: Secondary | ICD-10-CM | POA: Diagnosis present

## 2017-10-13 DIAGNOSIS — R351 Nocturia: Secondary | ICD-10-CM | POA: Diagnosis not present

## 2017-10-13 DIAGNOSIS — Z8673 Personal history of transient ischemic attack (TIA), and cerebral infarction without residual deficits: Secondary | ICD-10-CM

## 2017-10-13 DIAGNOSIS — I2510 Atherosclerotic heart disease of native coronary artery without angina pectoris: Secondary | ICD-10-CM | POA: Diagnosis present

## 2017-10-13 DIAGNOSIS — R778 Other specified abnormalities of plasma proteins: Secondary | ICD-10-CM | POA: Diagnosis present

## 2017-10-13 DIAGNOSIS — E119 Type 2 diabetes mellitus without complications: Secondary | ICD-10-CM | POA: Diagnosis present

## 2017-10-13 HISTORY — PX: CHOLECYSTECTOMY: SHX55

## 2017-10-13 LAB — GLUCOSE, CAPILLARY
GLUCOSE-CAPILLARY: 148 mg/dL — AB (ref 65–99)
GLUCOSE-CAPILLARY: 95 mg/dL (ref 65–99)
Glucose-Capillary: 97 mg/dL (ref 65–99)

## 2017-10-13 LAB — CBC
HCT: 30.1 % — ABNORMAL LOW (ref 39.0–52.0)
HEMOGLOBIN: 9.9 g/dL — AB (ref 13.0–17.0)
MCH: 29.9 pg (ref 26.0–34.0)
MCHC: 32.9 g/dL (ref 30.0–36.0)
MCV: 90.9 fL (ref 78.0–100.0)
Platelets: 190 10*3/uL (ref 150–400)
RBC: 3.31 MIL/uL — ABNORMAL LOW (ref 4.22–5.81)
RDW: 16.4 % — AB (ref 11.5–15.5)
WBC: 11.6 10*3/uL — ABNORMAL HIGH (ref 4.0–10.5)

## 2017-10-13 LAB — CREATININE, SERUM
Creatinine, Ser: 2.58 mg/dL — ABNORMAL HIGH (ref 0.61–1.24)
GFR calc non Af Amer: 21 mL/min — ABNORMAL LOW (ref >60)
GFR, EST AFRICAN AMERICAN: 25 mL/min — AB (ref >60)

## 2017-10-13 LAB — PROTIME-INR
INR: 0.97
Prothrombin Time: 12.8 seconds (ref 11.4–15.2)

## 2017-10-13 LAB — MRSA PCR SCREENING: MRSA by PCR: NEGATIVE

## 2017-10-13 LAB — APTT: APTT: 31 s (ref 24–36)

## 2017-10-13 LAB — TROPONIN I: TROPONIN I: 0.22 ng/mL — AB (ref <0.03)

## 2017-10-13 SURGERY — LAPAROSCOPIC CHOLECYSTECTOMY
Anesthesia: General | Site: Abdomen

## 2017-10-13 MED ORDER — SIMETHICONE 80 MG PO CHEW: 40.0000 mg | CHEWABLE_TABLET | Freq: Four times a day (QID) | ORAL | Status: DC | PRN

## 2017-10-13 MED ORDER — HEPARIN SODIUM (PORCINE) 5000 UNIT/ML IJ SOLN
5000.0000 [IU] | Freq: Once | INTRAMUSCULAR | Status: AC
  Administered 2017-10-13: 5000 [IU] via SUBCUTANEOUS
  Filled 2017-10-13: qty 1

## 2017-10-13 MED ORDER — 0.9 % SODIUM CHLORIDE (POUR BTL) OPTIME
TOPICAL | Status: DC | PRN
  Administered 2017-10-13: 1000 mL

## 2017-10-13 MED ORDER — EPHEDRINE SULFATE-NACL 50-0.9 MG/10ML-% IV SOSY
PREFILLED_SYRINGE | INTRAVENOUS | Status: DC | PRN
  Administered 2017-10-13 (×2): 10 mg via INTRAVENOUS

## 2017-10-13 MED ORDER — ONDANSETRON 4 MG PO TBDP: 4.0000 mg | ORAL_TABLET | Freq: Four times a day (QID) | ORAL | Status: DC | PRN

## 2017-10-13 MED ORDER — PHENYLEPHRINE 40 MCG/ML (10ML) SYRINGE FOR IV PUSH (FOR BLOOD PRESSURE SUPPORT)
PREFILLED_SYRINGE | INTRAVENOUS | Status: AC
  Filled 2017-10-13: qty 10

## 2017-10-13 MED ORDER — IOPAMIDOL (ISOVUE-300) INJECTION 61%
INTRAVENOUS | Status: AC
  Filled 2017-10-13: qty 50

## 2017-10-13 MED ORDER — PROMETHAZINE HCL 25 MG/ML IJ SOLN: 6.2500 mg | INTRAMUSCULAR | Status: DC | PRN

## 2017-10-13 MED ORDER — LIDOCAINE 2% (20 MG/ML) 5 ML SYRINGE
INTRAMUSCULAR | Status: DC | PRN
  Administered 2017-10-13: 100 mg via INTRAVENOUS

## 2017-10-13 MED ORDER — FENTANYL CITRATE (PF) 100 MCG/2ML IJ SOLN
INTRAMUSCULAR | Status: AC
  Filled 2017-10-13: qty 2

## 2017-10-13 MED ORDER — HEPARIN SODIUM (PORCINE) 5000 UNIT/ML IJ SOLN
5000.0000 [IU] | Freq: Three times a day (TID) | INTRAMUSCULAR | Status: DC
  Administered 2017-10-14 – 2017-10-15 (×5): 5000 [IU] via SUBCUTANEOUS
  Filled 2017-10-13 (×5): qty 1

## 2017-10-13 MED ORDER — GABAPENTIN 100 MG PO CAPS
100.0000 mg | ORAL_CAPSULE | Freq: Every day | ORAL | Status: DC
  Administered 2017-10-13 – 2017-10-16 (×4): 100 mg via ORAL
  Filled 2017-10-13 (×4): qty 1

## 2017-10-13 MED ORDER — TRAMADOL HCL 50 MG PO TABS
50.0000 mg | ORAL_TABLET | Freq: Four times a day (QID) | ORAL | Status: DC | PRN
Start: 2017-10-13 — End: 2017-10-17
  Administered 2017-10-13 – 2017-10-15 (×3): 50 mg via ORAL
  Filled 2017-10-13 (×4): qty 1

## 2017-10-13 MED ORDER — ROCURONIUM BROMIDE 10 MG/ML (PF) SYRINGE
PREFILLED_SYRINGE | INTRAVENOUS | Status: DC | PRN
  Administered 2017-10-13 (×3): 10 mg via INTRAVENOUS
  Administered 2017-10-13: 50 mg via INTRAVENOUS

## 2017-10-13 MED ORDER — CHLORHEXIDINE GLUCONATE CLOTH 2 % EX PADS: 6.0000 | MEDICATED_PAD | Freq: Once | CUTANEOUS | Status: DC

## 2017-10-13 MED ORDER — FENTANYL CITRATE (PF) 100 MCG/2ML IJ SOLN
INTRAMUSCULAR | Status: DC | PRN
  Administered 2017-10-13 (×2): 50 ug via INTRAVENOUS

## 2017-10-13 MED ORDER — HYDROMORPHONE HCL 1 MG/ML IJ SOLN
0.5000 mg | INTRAMUSCULAR | Status: DC | PRN
  Administered 2017-10-13 – 2017-10-16 (×6): 0.5 mg via INTRAVENOUS
  Filled 2017-10-13 (×3): qty 1
  Filled 2017-10-13: qty 0.5
  Filled 2017-10-13: qty 1
  Filled 2017-10-13 (×2): qty 0.5

## 2017-10-13 MED ORDER — ALBUTEROL SULFATE HFA 108 (90 BASE) MCG/ACT IN AERS: 2.0000 | INHALATION_SPRAY | Freq: Four times a day (QID) | RESPIRATORY_TRACT | Status: DC | PRN

## 2017-10-13 MED ORDER — SERTRALINE HCL 50 MG PO TABS
150.0000 mg | ORAL_TABLET | Freq: Every day | ORAL | Status: DC
  Administered 2017-10-14 – 2017-10-17 (×4): 150 mg via ORAL
  Filled 2017-10-13 (×4): qty 1

## 2017-10-13 MED ORDER — NITROGLYCERIN 0.4 MG SL SUBL
0.4000 mg | SUBLINGUAL_TABLET | SUBLINGUAL | Status: DC | PRN
  Administered 2017-10-15 (×2): 0.4 mg via SUBLINGUAL
  Filled 2017-10-13 (×3): qty 1

## 2017-10-13 MED ORDER — SUGAMMADEX SODIUM 200 MG/2ML IV SOLN
INTRAVENOUS | Status: AC
  Filled 2017-10-13: qty 2

## 2017-10-13 MED ORDER — POLYETHYLENE GLYCOL 3350 17 G PO PACK
17.0000 g | PACK | Freq: Every day | ORAL | Status: DC
  Administered 2017-10-14 – 2017-10-17 (×4): 17 g via ORAL
  Filled 2017-10-13 (×5): qty 1

## 2017-10-13 MED ORDER — ROCURONIUM BROMIDE 10 MG/ML (PF) SYRINGE
PREFILLED_SYRINGE | INTRAVENOUS | Status: AC
  Filled 2017-10-13: qty 5

## 2017-10-13 MED ORDER — CARVEDILOL 6.25 MG PO TABS
6.2500 mg | ORAL_TABLET | Freq: Two times a day (BID) | ORAL | Status: DC
  Administered 2017-10-13 – 2017-10-17 (×8): 6.25 mg via ORAL
  Filled 2017-10-13 (×10): qty 1

## 2017-10-13 MED ORDER — ATORVASTATIN CALCIUM 40 MG PO TABS
40.0000 mg | ORAL_TABLET | Freq: Every day | ORAL | Status: DC
  Administered 2017-10-13 – 2017-10-16 (×4): 40 mg via ORAL
  Filled 2017-10-13 (×4): qty 1

## 2017-10-13 MED ORDER — LIDOCAINE 2% (20 MG/ML) 5 ML SYRINGE
INTRAMUSCULAR | Status: AC
  Filled 2017-10-13: qty 5

## 2017-10-13 MED ORDER — CEFAZOLIN SODIUM-DEXTROSE 2-4 GM/100ML-% IV SOLN
2.0000 g | INTRAVENOUS | Status: AC
  Administered 2017-10-13: 2 g via INTRAVENOUS
  Filled 2017-10-13: qty 100

## 2017-10-13 MED ORDER — GABAPENTIN 300 MG PO CAPS
300.0000 mg | ORAL_CAPSULE | ORAL | Status: AC
  Administered 2017-10-13: 300 mg via ORAL
  Filled 2017-10-13: qty 1

## 2017-10-13 MED ORDER — HYDRALAZINE HCL 20 MG/ML IJ SOLN
10.0000 mg | INTRAMUSCULAR | Status: DC | PRN
  Administered 2017-10-13 (×2): 10 mg via INTRAVENOUS
  Filled 2017-10-13 (×2): qty 1

## 2017-10-13 MED ORDER — LACTATED RINGERS IV SOLN
INTRAVENOUS | Status: DC
  Administered 2017-10-13 – 2017-10-15 (×3): via INTRAVENOUS

## 2017-10-13 MED ORDER — ETOMIDATE 2 MG/ML IV SOLN
INTRAVENOUS | Status: DC | PRN
  Administered 2017-10-13: 12 mg via INTRAVENOUS

## 2017-10-13 MED ORDER — ACETAMINOPHEN 500 MG PO TABS
1000.0000 mg | ORAL_TABLET | ORAL | Status: AC
  Administered 2017-10-13: 1000 mg via ORAL
  Filled 2017-10-13: qty 2

## 2017-10-13 MED ORDER — TAMSULOSIN HCL 0.4 MG PO CAPS
0.4000 mg | ORAL_CAPSULE | Freq: Every day | ORAL | Status: DC
  Administered 2017-10-13 – 2017-10-16 (×4): 0.4 mg via ORAL
  Filled 2017-10-13 (×4): qty 1

## 2017-10-13 MED ORDER — INSULIN ASPART 100 UNIT/ML ~~LOC~~ SOLN: 0.0000 [IU] | Freq: Every day | SUBCUTANEOUS | Status: DC

## 2017-10-13 MED ORDER — LACTATED RINGERS IR SOLN
Status: DC | PRN
  Administered 2017-10-13: 1000 mL

## 2017-10-13 MED ORDER — ONDANSETRON HCL 4 MG/2ML IJ SOLN
INTRAMUSCULAR | Status: DC | PRN
  Administered 2017-10-13: 4 mg via INTRAVENOUS

## 2017-10-13 MED ORDER — BUPIVACAINE HCL (PF) 0.25 % IJ SOLN
INTRAMUSCULAR | Status: DC | PRN
  Administered 2017-10-13: 22 mL

## 2017-10-13 MED ORDER — SUGAMMADEX SODIUM 200 MG/2ML IV SOLN
INTRAVENOUS | Status: DC | PRN
  Administered 2017-10-13: 200 mg via INTRAVENOUS

## 2017-10-13 MED ORDER — FENTANYL CITRATE (PF) 100 MCG/2ML IJ SOLN
INTRAMUSCULAR | Status: AC
  Administered 2017-10-13: 100 ug via INTRAVENOUS
  Filled 2017-10-13: qty 2

## 2017-10-13 MED ORDER — PROPOFOL 10 MG/ML IV BOLUS
INTRAVENOUS | Status: AC
  Filled 2017-10-13: qty 20

## 2017-10-13 MED ORDER — ASPIRIN EC 81 MG PO TBEC
81.0000 mg | DELAYED_RELEASE_TABLET | Freq: Every day | ORAL | Status: DC
  Administered 2017-10-14 – 2017-10-17 (×4): 81 mg via ORAL
  Filled 2017-10-13 (×4): qty 1

## 2017-10-13 MED ORDER — PROPOFOL 10 MG/ML IV BOLUS
INTRAVENOUS | Status: DC | PRN
  Administered 2017-10-13: 20 mg via INTRAVENOUS
  Administered 2017-10-13: 30 mg via INTRAVENOUS

## 2017-10-13 MED ORDER — ONDANSETRON HCL 4 MG/2ML IJ SOLN
4.0000 mg | Freq: Four times a day (QID) | INTRAMUSCULAR | Status: DC | PRN
  Administered 2017-10-13: 4 mg via INTRAVENOUS
  Filled 2017-10-13: qty 2

## 2017-10-13 MED ORDER — ONDANSETRON HCL 4 MG/2ML IJ SOLN
INTRAMUSCULAR | Status: AC
  Filled 2017-10-13: qty 2

## 2017-10-13 MED ORDER — INSULIN ASPART 100 UNIT/ML ~~LOC~~ SOLN
0.0000 [IU] | Freq: Three times a day (TID) | SUBCUTANEOUS | Status: DC
  Administered 2017-10-14 – 2017-10-17 (×4): 1 [IU] via SUBCUTANEOUS

## 2017-10-13 MED ORDER — LACTATED RINGERS IV SOLN
INTRAVENOUS | Status: DC
  Administered 2017-10-13 (×2): via INTRAVENOUS

## 2017-10-13 MED ORDER — LISINOPRIL 5 MG PO TABS
2.5000 mg | ORAL_TABLET | Freq: Every day | ORAL | Status: DC
  Administered 2017-10-14 – 2017-10-15 (×2): 2.5 mg via ORAL
  Filled 2017-10-13 (×2): qty 1

## 2017-10-13 MED ORDER — BUPIVACAINE HCL (PF) 0.25 % IJ SOLN
INTRAMUSCULAR | Status: AC
  Filled 2017-10-13: qty 30

## 2017-10-13 MED ORDER — FENTANYL CITRATE (PF) 100 MCG/2ML IJ SOLN: 25.0000 ug | INTRAMUSCULAR | Status: DC | PRN

## 2017-10-13 MED ORDER — FENTANYL CITRATE (PF) 100 MCG/2ML IJ SOLN
50.0000 ug | INTRAMUSCULAR | Status: DC
  Administered 2017-10-13: 100 ug via INTRAVENOUS

## 2017-10-13 MED ORDER — PHENYLEPHRINE 40 MCG/ML (10ML) SYRINGE FOR IV PUSH (FOR BLOOD PRESSURE SUPPORT)
PREFILLED_SYRINGE | INTRAVENOUS | Status: DC | PRN
Start: 2017-10-13 — End: 2017-10-13
  Administered 2017-10-13 (×5): 80 ug via INTRAVENOUS

## 2017-10-13 MED ORDER — ACETAMINOPHEN 500 MG PO TABS
1000.0000 mg | ORAL_TABLET | Freq: Four times a day (QID) | ORAL | Status: DC
  Administered 2017-10-13 – 2017-10-17 (×12): 1000 mg via ORAL
  Filled 2017-10-13 (×14): qty 2

## 2017-10-13 MED ORDER — ALBUTEROL SULFATE (2.5 MG/3ML) 0.083% IN NEBU: 2.5000 mg | INHALATION_SOLUTION | Freq: Four times a day (QID) | RESPIRATORY_TRACT | Status: DC | PRN

## 2017-10-13 MED ORDER — ALBUMIN HUMAN 5 % IV SOLN
INTRAVENOUS | Status: DC | PRN
  Administered 2017-10-13: 13:00:00 via INTRAVENOUS

## 2017-10-13 MED ORDER — PANTOPRAZOLE SODIUM 40 MG PO TBEC
40.0000 mg | DELAYED_RELEASE_TABLET | Freq: Every day | ORAL | Status: DC
  Administered 2017-10-14 – 2017-10-17 (×4): 40 mg via ORAL
  Filled 2017-10-13 (×4): qty 1

## 2017-10-13 MED ORDER — SODIUM CHLORIDE 0.9 % IV SOLN
INTRAVENOUS | Status: DC
  Administered 2017-10-13: 11:00:00 via INTRAVENOUS

## 2017-10-13 SURGICAL SUPPLY — 40 items
APPLIER CLIP 5 13 M/L LIGAMAX5 (MISCELLANEOUS) ×3
APPLIER CLIP ROT 10 11.4 M/L (STAPLE) ×3
CABLE HIGH FREQUENCY MONO STRZ (ELECTRODE) ×3 IMPLANT
CHLORAPREP W/TINT 26ML (MISCELLANEOUS) ×3 IMPLANT
CLIP APPLIE 5 13 M/L LIGAMAX5 (MISCELLANEOUS) ×1 IMPLANT
CLIP APPLIE ROT 10 11.4 M/L (STAPLE) ×1 IMPLANT
COVER MAYO STAND STRL (DRAPES) IMPLANT
COVER SURGICAL LIGHT HANDLE (MISCELLANEOUS) ×3 IMPLANT
DECANTER SPIKE VIAL GLASS SM (MISCELLANEOUS) IMPLANT
DERMABOND ADVANCED (GAUZE/BANDAGES/DRESSINGS) ×2
DERMABOND ADVANCED .7 DNX12 (GAUZE/BANDAGES/DRESSINGS) ×1 IMPLANT
DISSECTOR BLUNT TIP ENDO 5MM (MISCELLANEOUS) IMPLANT
DRAPE C-ARM 42X120 X-RAY (DRAPES) ×3 IMPLANT
ELECT PENCIL ROCKER SW 15FT (MISCELLANEOUS) ×3 IMPLANT
ELECT REM PT RETURN 15FT ADLT (MISCELLANEOUS) ×3 IMPLANT
GLOVE BIO SURGEON STRL SZ7.5 (GLOVE) ×3 IMPLANT
GLOVE INDICATOR 8.0 STRL GRN (GLOVE) ×3 IMPLANT
GOWN STRL REUS W/TWL XL LVL3 (GOWN DISPOSABLE) ×6 IMPLANT
GRASPER SUT TROCAR 14GX15 (MISCELLANEOUS) IMPLANT
HEMOSTAT SNOW SURGICEL 2X4 (HEMOSTASIS) IMPLANT
KIT BASIN OR (CUSTOM PROCEDURE TRAY) ×3 IMPLANT
NEEDLE INSUFFLATION 14GA 120MM (NEEDLE) IMPLANT
POUCH RETRIEVAL ECOSAC 10 (ENDOMECHANICALS) ×1 IMPLANT
POUCH RETRIEVAL ECOSAC 10MM (ENDOMECHANICALS) ×2
POUCH SPECIMEN RETRIEVAL 10MM (ENDOMECHANICALS) ×3 IMPLANT
SCISSORS LAP 5X35 DISP (ENDOMECHANICALS) ×3 IMPLANT
SET CHOLANGIOGRAPH MIX (MISCELLANEOUS) ×3 IMPLANT
SET IRRIG TUBING LAPAROSCOPIC (IRRIGATION / IRRIGATOR) ×3 IMPLANT
SLEEVE ADV FIXATION 5X100MM (TROCAR) ×9 IMPLANT
SUT MNCRL AB 4-0 PS2 18 (SUTURE) ×3 IMPLANT
SUT PDS AB 1 CT1 27 (SUTURE) ×6 IMPLANT
SYR 10ML ECCENTRIC (SYRINGE) ×3 IMPLANT
TOWEL OR 17X26 10 PK STRL BLUE (TOWEL DISPOSABLE) ×3 IMPLANT
TOWEL OR NON WOVEN STRL DISP B (DISPOSABLE) IMPLANT
TRAY LAPAROSCOPIC (CUSTOM PROCEDURE TRAY) ×3 IMPLANT
TROCAR ADV FIXATION 12X100MM (TROCAR) IMPLANT
TROCAR ADV FIXATION 5X100MM (TROCAR) ×3 IMPLANT
TROCAR XCEL BLUNT TIP 100MML (ENDOMECHANICALS) ×3 IMPLANT
TROCAR XCEL NON-BLD 11X100MML (ENDOMECHANICALS) IMPLANT
TUBING INSUF HEATED (TUBING) ×3 IMPLANT

## 2017-10-13 NOTE — Transfer of Care (Signed)
Immediate Anesthesia Transfer of Care Note  Patient: Rodney Farley  Procedure(s) Performed: LAPAROSCOPIC CHOLECYSTECTOMY WITH LYSIS OF ADHESIONS (N/A Abdomen)  Patient Location: PACU  Anesthesia Type:General  Level of Consciousness: awake, alert  and oriented  Airway & Oxygen Therapy: Patient Spontanous Breathing and Patient connected to face mask oxygen  Post-op Assessment: Report given to RN and Post -op Vital signs reviewed and stable  Post vital signs: Reviewed and stable  Last Vitals:  Vitals Value Taken Time  BP 130/61 10/13/2017  3:04 PM  Temp    Pulse 71 10/13/2017  3:06 PM  Resp 20 10/13/2017  3:06 PM  SpO2 98 % 10/13/2017  3:06 PM  Vitals shown include unvalidated device data.  Last Pain:  Vitals:   10/13/17 1034  TempSrc: Oral      Patients Stated Pain Goal: 4 (37/29/02 1115)  Complications: No apparent anesthesia complications

## 2017-10-13 NOTE — Anesthesia Procedure Notes (Signed)
Procedure Name: Intubation Date/Time: 10/13/2017 12:08 PM Performed by: Drelyn Pistilli D, CRNA Pre-anesthesia Checklist: Patient identified, Emergency Drugs available, Suction available and Patient being monitored Patient Re-evaluated:Patient Re-evaluated prior to induction Oxygen Delivery Method: Circle system utilized Preoxygenation: Pre-oxygenation with 100% oxygen Induction Type: IV induction Ventilation: Mask ventilation without difficulty Laryngoscope Size: Mac and 4 Grade View: Grade I Tube type: Oral Tube size: 7.5 mm Number of attempts: 1 Airway Equipment and Method: Stylet Placement Confirmation: ETT inserted through vocal cords under direct vision,  positive ETCO2 and breath sounds checked- equal and bilateral Secured at: 22 cm Tube secured with: Tape Dental Injury: Teeth and Oropharynx as per pre-operative assessment

## 2017-10-13 NOTE — H&P (Signed)
H&P Update  The H&P dated 09/13/17 was reviewed by myself with the patient. He reports no interval change in his health or medications.  Vitals:   10/13/17 1125 10/13/17 1126 10/13/17 1127 10/13/17 1128  BP:      Pulse: 77 74 72 71  Resp: 11 14 12 14   Temp:      TempSrc:      SpO2: 100% 100% 100% 100%  Weight:      Height:       Gen: NAD, comfortable CV: RRR Abd: Soft, NT/ND  A/P  Rodney Farley is a very pleasant 21yoM with hx of HTN, HLD, DM, CKD, CAD s/p stents with probable symptomatic cholelithiasis -Will proceed today with laparoscopic vs open cholecystectomy -The anatomy and physiology of the hepatobiliary tract was discussed at length with the patient. The pathophysiology of gallstones and gallbladder pathology was discussed at length with associated pictures and illustrations. -The planned procedure, material risks (including, but not limited to, pain, bleeding, infection, scarring, need for blood transfusion, damage to surrounding structures- blood vessels/nerves/viscus/organs, damage to bile duct, bile leak, need for additional procedures, hernia, pancreatitis, pneumonia, heart attack, stroke, death) benefits and alternatives to surgery were discussed at length. I noted a good probability that the procedure would help improve his symptoms. He and his daughter in [place] questions answered to their satisfaction, they voiced understanding and they elected to proceed with surgery.  Sharon Mt. Dema Severin, M.D. General and Colorectal Surgery [facility]

## 2017-10-13 NOTE — Anesthesia Preprocedure Evaluation (Addendum)
Anesthesia Evaluation  Patient identified by MRN, date of birth, ID band Patient awake    Reviewed: Allergy & Precautions, NPO status , Patient's Chart, lab work & pertinent test results  Airway Mallampati: II  TM Distance: >3 FB Neck ROM: Full    Dental no notable dental hx.    Pulmonary neg pulmonary ROS, former smoker,    Pulmonary exam normal breath sounds clear to auscultation       Cardiovascular hypertension, + CAD, + Past MI, + Cardiac Stents and +CHF  Normal cardiovascular exam+ dysrhythmias + Cardiac Defibrillator  Rhythm:Regular Rate:Normal  '17 ECHO: EF 60-65%, valves ok   Neuro/Psych TIAnegative psych ROS   GI/Hepatic negative GI ROS, Neg liver ROS,   Endo/Other  diabetes, Insulin Dependent  Renal/GU negative Renal ROS  negative genitourinary   Musculoskeletal negative musculoskeletal ROS (+)   Abdominal   Peds negative pediatric ROS (+)  Hematology negative hematology ROS (+)   Anesthesia Other Findings   Reproductive/Obstetrics negative OB ROS                            Anesthesia Physical Anesthesia Plan  ASA: IV  Anesthesia Plan: General   Post-op Pain Management:    Induction: Intravenous  PONV Risk Score and Plan: 2 and Ondansetron and Treatment may vary due to age or medical condition  Airway Management Planned: Oral ETT  Additional Equipment: Arterial line  Intra-op Plan:   Post-operative Plan: Extubation in OR  Informed Consent: I have reviewed the patients History and Physical, chart, labs and discussed the procedure including the risks, benefits and alternatives for the proposed anesthesia with the patient or authorized representative who has indicated his/her understanding and acceptance.   Dental advisory given  Plan Discussed with: CRNA and Surgeon  Anesthesia Plan Comments:         Anesthesia Quick Evaluation

## 2017-10-13 NOTE — Progress Notes (Signed)
Assisted Dr. Kalman Shan with arterial line insertion. Side rails up, monitors on throughout procedure. See vital signs in flow sheet. Tolerated Procedure well.

## 2017-10-13 NOTE — Anesthesia Procedure Notes (Signed)
Arterial Line Insertion Start/End4/09/2017 11:36 AM, 10/13/2017 11:45 AM Performed by: Myrtie Soman, MD  Patient location: Pre-op. Preanesthetic checklist: patient identified, IV checked, site marked, risks and benefits discussed, surgical consent, monitors and equipment checked, pre-op evaluation, timeout performed and anesthesia consent Lidocaine 1% used for infiltration Right, radial was placed Catheter size: 20 Fr Hand hygiene performed  and maximum sterile barriers used   Attempts: 1 Procedure performed without using ultrasound guided technique. Following insertion, dressing applied and Biopatch. Post procedure assessment: normal and unchanged

## 2017-10-13 NOTE — OR Nursing (Signed)
IN AND OUT CATH AT THE END OF PROCEDURE FOR 150ML

## 2017-10-13 NOTE — Progress Notes (Addendum)
CRITICAL VALUE ALERT- Troponin 1/3  Critical Value:  Troponin 0.22  Date & Time Notied:  10/13/17 @ 2330  Provider Notified, no orders at this time __________________________________________________   CRITICAL VALUE ALERT----- Troponin 2/3  Critical Value:  Troponin 1.38  Date & Time Notied:  10/13/17 @ 0630  Provider Notified

## 2017-10-13 NOTE — Anesthesia Postprocedure Evaluation (Signed)
Anesthesia Post Note  Patient: Rodney Farley  Procedure(s) Performed: LAPAROSCOPIC CHOLECYSTECTOMY WITH LYSIS OF ADHESIONS (N/A Abdomen)     Patient location during evaluation: PACU Anesthesia Type: General Level of consciousness: awake and alert Pain management: pain level controlled Vital Signs Assessment: post-procedure vital signs reviewed and stable Respiratory status: spontaneous breathing, nonlabored ventilation, respiratory function stable and patient connected to nasal cannula oxygen Cardiovascular status: blood pressure returned to baseline and stable Postop Assessment: no apparent nausea or vomiting Anesthetic complications: no    Last Vitals:  Vitals:   10/13/17 1545 10/13/17 1600  BP: (!) 172/73 (!) 179/75  Pulse: 70 77  Resp: 17 10  Temp: 36.4 C 36.5 C  SpO2: 96% 97%    Last Pain:  Vitals:   10/13/17 1600  TempSrc: Oral  PainSc:                  Jesstin Studstill S

## 2017-10-13 NOTE — Op Note (Signed)
10/13/2017 3:01 PM  PATIENT: Rodney Farley  84 y.o. male  Patient Care Team: [name], MD as PCP - General (Family Medicine) [name], Arath M, MD as PCP - Cardiology (Cardiology)  PRE-OPERATIVE DIAGNOSIS: SYMPTOMATIC CHOLELITHIASIS  POST-OPERATIVE DIAGNOSIS: SYMPTOMATIC CHOLELITHIASIS  PROCEDURE:  1. Laparoscopic cholecystectomy 2. Laparoscopic lysis of adhesions x 90 minutes  SURGEON: Sharon Mt. Dema Severin, MD  ASSISTANT: Michael Boston, MD - An MD assistant was necessary for complex exposure and adhesiolysis due to multiple prior abdominal operations.   ANESTHESIA: General endotracheal  EBL: Total I/O In: 1250 [I.V.:1000; IV Piggyback:250] Out: 200 [Urine:150; Blood:50]  DRAINS: None  SPECIMEN: Gallbladder  COUNTS: Sponge, needle and instrument counts were reported correct x2 at the conclusion of the operation  DISPOSITION: PACU in satisfactory condition  FINDINGS: Dense adhesions between omentum, bowel and abdominal wall in the midline and right upper quadrant which had to be taken down to facilitate exposure and safe dissection. The gallbladder was chronically inflamed in appearance. A critical view of safety was obtained prior to ligating or dividing any structures.  INDICATION:  [name] is a very pleasant 84 year old gentleman with history of hypertension, hyperlipidemia, diabetes (insulin-dependent), CHF, CAD who underwent ex-lap/ileocecectomy with ileocolic anastomosis 11/04/93 for perforated appendicitis with purulent peritonitis. He had a prolonged recovery from this and was ultimately discharged to a skilled rehabilitation facility. He had an open wound in the midline from surgery that was managed with a wound VAC and ultimately closed. Over the last couple of months he has developed intermittent crampy right upper quadrant pain that sits just beneath his right rib cage. He was seen in the emergency room for this 07/2017. Laboratory evaluation is  pertinent for a Irene Mitcham count of 11 and normal LFTs. Underwent a CT scan of the abdomen and pelvis without contrast which demonstrated postoperative changes consistent with an ileocolic anastomosis as well as gallstones and scattered calcifications in the pancreas compatible with chronic pancreatitis. He was seen back in the office and stated he had had a prior cholecystectomy and was unsure why his CT scan showed a gallbladder with stones and it. He has some question whether or not there is a neck supple with his imaging but given his prior surgical history and ileocolic anastomosis which was present on his CAT scan I felt it was likely his CT scan. To be sure, we obtain an ultrasound of the right upper quadrant 08/18/17 which demonstrated gallstones orders and which is 3.7 cm as well as some discomfort over the gallbladder. 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  We discussed operative approaches to gallbladder disease. Given his near daily symptoms and discomfort as well as risk of subsequent gallbladder complications he opted to proceed with surgery.  The anatomy and physiology of the hepatobiliary tract was discussed at length with the patient. The pathophysiology of gallstones and gallbladder pathology was discussed at length with associated pictures and illustrations.  The planned procedure (including technical aspects and laparoscopic as well as open approaches), material risks (including, but not limited to, pain, bleeding, infection, scarring, need for blood transfusion, damage to surrounding structures- blood vessels/nerves/viscus/organs, damage to bile duct, bile leak, need for additional procedures, hernia, pancreatitis, pneumonia, heart attack, stroke, death) benefits and alternatives to surgery were discussed at length. He and his daughter in [place] questions answered to their satisfaction, they voiced understanding and they elected to proceed with  surgery.  DESCRIPTION:   The patient was identified & brought into the operating room. The patient  was positioned supine on the OR table. SCDs were in place and active during the entire case. The patient underwent general endotracheal anesthesia. The abdomen was prepped and draped in the standard sterile fashion and antibiotics were administered. A surgical timeout was performed and confirmed our plan.   A stab incision was made in the left upper quadrant and a Veress needle was inserted into the peritoneal cavity and location confirmed with aspiration and saline drop test. The abdomen was then insufflated with CO2 to 25mmHg. An optiview trocar was then placed in the left upper quadrant under direct visualization. Inspection revealed no evidence of trocar site complications. An additional 64mm trocar was placed in the left mid abdomen under direct visualization. A substantial adhesion burden was encountered in the midline. These adhesions were tediously taken down using sharp dissection. Ultimately additional 25mm trocars were placed along the right upper abdomen. The stomach and omentum as well as left edge of liver were also stuck to the abdominal wall and gently taken down sharply. A loop of bowel was also encountered at this level and taken down sharply. This was then inspected following the adhesiolysis and no violation to the serosa was noted. Ultimately, the gallbladder was visualized in the right upper quadrant.  The patient was positioned in reverse Trendelenburg with the left side down. The liver and gallbladder were inspected. The gallbladder fundus was grasped and elevated cephalad. An additional grasper was then placed on the infundibulum of the gallbladder and the infundibulum was retracted laterally. Gentle blunt dissection was then employed with a IT consultant working down into [employer]. The peritoneum on both sides of the gallbladder was opened with hook cautery. The cystic duct  was identified, carefully circumferentially dissected. The cystic artery was then carefully circumferentially dissected. The space between the cystic artery and hepatocystic plate was developed such that the liver could be seen through a window medial to the cystic artery. The triangle of Calot was cleared of all fibrofatty tissue. At this point, a critical view of safety was achieved and the only structures visualized was the skeletonized cystic duct laterally, the skeletonized cystic artery and the liver through the window medial to the artery. The midline 57mm trocar was exchanged for a 4mm trocar.  The cystic duct and artery were clipped with 2 clips on the "stay" side and 1 clip on the specimen side. The cystic duct and artery were then divided. The gallbladder was then freed from its remaining attachments to the liver using electrocautery and placed into an Ecosac. Hemostasis was achieved and then re-verified. The rest of the abdomen was inspected no injury nor bleeding elsewhere was identified. The RUQ was irrigated with normal saline. The clips and gallbladder fossa were reinspected and noted to be in place and hemostatic.  The gallbladder and bag was then removed from the midline port site and passed off as specimen. The midline extraction site was closed with 2 running #1 PDS sutures. The ports were removed under direct visualization and noted to be hemostatic. The skin of all incision sites was approximated with 4-0 monocryl subcuticular suture and dermabond applied. The patient was then extubated and transferred to a stretcher for transport to PACU in satisfactory condition.

## 2017-10-13 NOTE — Progress Notes (Signed)
Attempted several times to interrogate AICD with portable monitor; Dr. Kalman Shan notified and OK to NOT interrogate AICD

## 2017-10-13 NOTE — Consult Note (Signed)
[facility] Medical Consultation  Nadeem Romanoski WGN:[id number] DOB: [birth year] DOA: 10/13/2017 PCP: [name], MD   Requesting physician: Nadeen Landau Date of consultation: 10/13/2017 Reason for consultation: management of multiple chronic medical conditions  Impression/Recommendations  CAD, with left neck and shoulder pain which are likely referred pain from his recent surgery -Check EKG:  No ST segment changes, paced rhythm -Continue telemetry -Cycle troponins -Resume aspirin in a.m. -Continue statin and beta-blocker - resume prn NTG  Hypertension, blood pressure is elevated, likely secondary to pain -Treat pain -Continue carvedilol and ACE inhibitor -Agree with as needed hydralazine  Hyperlipidemia, will, continue statin   Chronic combined systolic and diastolic heart failure (with EF as low as 25% although his most recent echocardiogram in 2017 demonstrated preserved EF), s/p AICD -Reassess volume status in a.m., may need to resume Lasix tomorrow -Continue ACE inhibitor and beta-blocker  COPD, stable -  Continue prn albuterol  diabetes mellitus type 2,   - A1c 5.6 on 10/07/2017  - hold lantus - start only low dose SSI  chronic kidney disease stage IV, creatinine at baseline of 2.5 -Minimize nephrotoxins, renally dose medications  Symptomatic gallstones s/p lap cholecystectomy on 4/3 -  Management per general surgery  Epistaxis -  Recommend outpatient f/u with ENT  I will followup again tomorrow. Please contact me if I can be of assistance in the meanwhile. Thank you for this consultation.  Chief Complaint: gallstone  HPI:  The patient is an 84 year old male with history of coronary artery disease, hypertension, hyperlipidemia, diabetes mellitus type 2, chronic kidney disease stage IV, chronic systolic heart failure (with EF as low as 25% although his most recent echocardiogram in 2017 demonstrated preserved EF), COPD who presented for  elective laparoscopic cholecystectomy on 4/3 secondary to symptomatic gallstones.  He will underwent laparoscopic cholecystectomy on 4/3 however surgery was prolonged due to his numerous adhesions and he was under general anesthesia for about 2 hours.  Postoperatively, his blood pressures were elevated and he was admitted to the stepdown unit for close observation overnight.  We were consulted for management of his chronic medical conditions.  CAD: Patient denies recent chest pressure or pains.  After waking up from surgery however he is having some left neck and left shoulder pain.  He denies associated shortness of breath but does have difficulty taking a deep breath and has some mild nausea.  Hypertension, blood pressures are usually 140s over 80s, currently elevated above his baseline in the setting of pain.  Diabetes mellitus type 2, his blood sugars have been 70-130 as an outpatient.  His insulin requirements have drastically decreased since last fall.  He was previously on 60 units of Lantus and he is down to 25 units of Lantus at night.  The night prior to his operation he took 12 units of Lantus.  Chronic combined systolic and diastolic heart failure with increased lower extremity edema over the last week.  He has been increasing the frequency of his Lasix from every other day to daily for the last 4-5 days.  COPD, breathing has been stable  He incidentally reported that he has been having epistaxis and has had about 4 nosebleeds over the last 2 months.  They have soaked his pillows and he has had to get rid of 2 pillows.  They take a most 30 minutes to stop.  Bleeding is always from the right nare.  Last epistaxis was this morning.  Review of Systems:  Feels bloated and nauseated after  surgery.  Pain in upper abdomen and left shoulder/left neck  Complete 12 point review of systems reviewed with patient and negative except as mentioned above.    Past Medical History:  Diagnosis Date  .  AICD (automatic cardioverter/defibrillator) present   . AKI (acute kidney injury) ([place]) 03/27/2017  . Anemia   . Anginal pain ([place])   . Anxiety   . Arm pain 05/08/2015   LEFT ARM  . Atrioventricular block, complete ([place])    a. 2010 s/p pacemaker.  . Bell's palsy    left side. after shingles episode  . Bilateral renal cysts 07/23/2017   Simple and hemorrhagic noted on CT ab/pelvis   . BPH associated with nocturia   . Cardiomegaly   . Chronic systolic CHF (congestive heart failure) ([place])    a. LVEF 35-40% by echo 09/2014.  [name] CKD (chronic kidney disease), stage IV ([place])   . COPD (chronic obstructive pulmonary disease) ([facility])    Severe  . Coronary artery disease    a. s/p MI in 1994/1995 while in [place] s/p questionable PCI. 03/2015: progression of disease, for staged PCI.  [name] Depression   . Diabetic peripheral neuropathy ([place])   . Diarrhea   . Diverticulitis   . DOE (dyspnea on exertion) 03/28/2015  . Gallstones   . GERD (gastroesophageal reflux disease)   . Gout   . Hard of hearing   . History of chronic pancreatitis 07/23/2017   noted on CT abd/pelvis  . History of shingles   . Hypercholesterolemia   . Hypertension   . Ischemic cardiomyopathy   . MI (myocardial infarction) ([place]) 1994; 1995  . Neuropathy    IN LOWER EXTREMITIES  . Nosebleed 10/06/2017   for 2 months most recent 10/06/2017  . Obesity   . Pacemaker    medtronic>>> MDT ICD 09/23/15  . Ruptured appendicitis   . Sleep apnea    "sleeps w/humidifyer when he panics and gets [place] of breath" (04/08/2015)  . TIA (transient ischemic attack) X 3  . Trigger middle finger of left hand   . Type II diabetes mellitus ([place])    Past Surgical History:  Procedure Laterality Date  . CARDIAC CATHETERIZATION N/A 03/29/2015   Procedure: Right/Left Heart Cath and Coronary Angiography;  Surgeon: Arvo M [name], MD;  Location: [facility];  Service: Cardiovascular;  Laterality: N/A;  . [place]   "after  my MI; put me on heart RX after cath"  . CARDIAC CATHETERIZATION N/A 04/09/2015   Procedure: Coronary Stent Intervention;  Surgeon: Domique M [name], MD;  Location: [facility];  Service: Cardiovascular;  Laterality: N/A;  . COLONOSCOPY    . DENTAL SURGERY    . EP IMPLANTABLE DEVICE N/A 09/23/2015   MDT CRT-D, Dr. Caryl Comes  . HIATAL HERNIA REPAIR  1977  . ILEOCECETOMY N/A 03/27/2017   Procedure: ILEOCECECTOMY;  Surgeon: Ileana Roup, MD;  Location: [facility];  Service: General;  Laterality: N/A;  . INSERT / REPLACE / REMOVE PACEMAKER  07/2008   Complete heart block status post DDD with good function  . LAPAROTOMY N/A 03/27/2017   Procedure: EXPLORATORY LAPAROTOMY;  Surgeon: Ileana Roup, MD;  Location: [facility];  Service: General;  Laterality: N/A;  . TONSILLECTOMY    . UPPER GI ENDOSCOPY     Social History:  reports that he quit smoking about 10 years ago. His smoking use included cigarettes. He has a 81.00 pack-year smoking history. He has never used smokeless tobacco.  He reports that he drinks alcohol. He reports that he does not use drugs.  Allergies  Allergen Reactions  . Bee Venom Anaphylaxis  . Lyrica [Pregabalin] Other (See Comments)    hallucinations  . Prednisone Other (See Comments)    hallucinations  . Zocor [Simvastatin] Nausea Only and Other (See Comments)    Headache with brand name only.  Can take the generic.   Family History  Problem Relation Age of Onset  . Stroke Mother   . Leukemia Father   . Stroke Sister   . Heart attack Brother     Prior to Admission medications   Medication Sig Start Date End Date Taking? Authorizing Provider  albuterol (PROVENTIL HFA;VENTOLIN HFA) 108 (90 Base) MCG/ACT inhaler Inhale 2 puffs into the lungs every 6 (six) hours as needed for wheezing or shortness of breath. 08/12/17  Yes [name], MD  aspirin EC 81 MG tablet Take 81 mg by mouth daily.   Yes [provider]  atorvastatin (LIPITOR) 40 MG tablet  Take 1 tablet (40 mg total) by mouth daily. 06/14/17  Yes [name], MD  carvedilol (COREG) 6.25 MG tablet Take 1 tablet (6.25 mg total) by mouth 2 (two) times daily with a meal. 06/19/17  Yes [name], MD  furosemide (LASIX) 40 MG tablet Take 1 tablet (40 mg total) by mouth as needed (take as needed for swelling). 06/02/17 10/13/17 Yes Almyra Deforest, PA  gabapentin (NEURONTIN) 100 MG capsule Take 1 capsule (100 mg total) at bedtime by mouth. 05/24/17  Yes [name], MD  Insulin Glargine (LANTUS SOLOSTAR) 100 UNIT/ML Solostar Pen Inject 60 Units into the skin daily at 10 pm. Patient taking differently: Inject 40 Units into the skin daily at 10 pm.  07/01/17  Yes [name], MD  lisinopril (PRINIVIL,ZESTRIL) 2.5 MG tablet Take 1 tablet (2.5 mg total) by mouth daily. 06/18/17  Yes [name], MD  pantoprazole (PROTONIX) 40 MG tablet TAKE 1 TABLET BY MOUTH ONCE DAILY Patient taking differently: TAKE 1 TABLET (40 MG) BY MOUTH ONCE DAILY 03/08/17  Yes [name], Meziah M, MD  Polyethylene Glycol 400 (BLINK TEARS) 0.25 % SOLN Place 1 drop into both eyes 3 (three) times daily.   Yes [provider]  sertraline (ZOLOFT) 100 MG tablet Take 1.5 tablets (150 mg total) by mouth daily. 03/25/17  Yes [name], MD  tamsulosin (FLOMAX) 0.4 MG CAPS capsule TAKE 1 CAPSULE BY MOUTH DAILY AFTER SUPPER Patient taking differently: Take 0.4 mg by mouth daily after supper.  03/25/17  Yes [name], MD  triamcinolone cream (KENALOG) 0.1 % Apply 1 application topically 2 (two) times daily. For 7-10 days maximum 05/11/17  Yes [name], MD  vitamin B-12 (CYANOCOBALAMIN) 1000 MCG tablet Take 1,000 mcg by mouth daily.   Yes [provider]  Blood Glucose Monitoring Suppl (FREESTYLE LITE) DEVI  02/04/17   [provider]  glucose blood (FREESTYLE TEST STRIPS) test strip Use to check blood sugar daily 06/18/17   [name], MD  glucose monitoring kit  (FREESTYLE) monitoring kit USE TO MONITOR BLOOD GLUCOSE AS DIRECTED 02/04/17   [name], MD  Insulin Pen Needle (UNIFINE PENTIPS) 31G X 5 MM MISC USE AS DIRECTED 01/28/17   [name], MD  nitroGLYCERIN (NITROSTAT) 0.4 MG SL tablet Place 1 tablet (0.4 mg total) under the tongue every 5 (five) minutes as needed for chest pain. 2 doses before calling for emergent  help 03/25/17   [name], MD  polyethylene glycol [facility] Floria Raveling) packet Take 17 g by mouth daily. Patient not taking: Reported on 10/13/2017 04/06/17   Shirley, [name], DO   Physical Exam: Blood pressure (!) 179/75, pulse 77, temperature 97.7 F (36.5 C), temperature source Oral, resp. rate 18, height _0  (1.702 m), weight 97.5 kg (215 lb), SpO2 97 %. Vitals:   10/13/17 1545 10/13/17 1600 10/13/17 1700 10/13/17 1800  BP: (!) 172/73 (!) 179/75    Pulse: 70 77    Resp: _1 Temp: 97.6 F (36.4 C) 97.7 F (36.5 C)    TempSrc:  Oral    SpO2: 96% 97% 97% 97%  Weight:      Height:  _2  (1.702 m)       General:  Adult male, no acute distress.    Eyes:  PERRL, anicteric, non-injected.  ENT:  Nares clear.  OP clear, non-erythematous without plaques or exudates.  MMM.  Neck:  Supple without TM or JVD.    Lymph:  No cervical, supraclavicular, or submandibular LAD.  Cardiovascular:  RRR, normal S1, S2, without m/r/g.  2+ pulses, warm extremities  Respiratory:  CTA bilaterally without increased WOB.  Abdomen:  Hypoactive but present BS.  Soft, nondistended, TTP diffusely but he will only in the right upper quadrant and epigastric area without rebound or guarding.  Lap port sites with edges well approximated.  Skin:  No rashes or focal lesions.  Musculoskeletal:  Normal bulk and tone.  1+ pitting bilateral LE edema.  Psychiatric:  A & O x 4.  Appropriate affect.  Neurologic:  CN 3-12 intact.  5/5 strength.  Sensation intact.  Labs on Admission:  Basic Metabolic Panel: Recent Labs  Lab  10/07/17 0952 10/13/17 1658  NA 142  --   K 5.2*  --   CL 110  --   CO2 23  --   GLUCOSE 110*  --   BUN 38*  --   CREATININE 2.72* 2.58*  CALCIUM 8.8*  --    Liver Function Tests: No results for input(s): AST, ALT, ALKPHOS, BILITOT, PROT, ALBUMIN in the last 168 hours. No results for input(s): LIPASE, AMYLASE in the last 168 hours. No results for input(s): AMMONIA in the last 168 hours. CBC: Recent Labs  Lab 10/07/17 0952 10/13/17 1658  WBC 8.4 11.6*  HGB 10.7* 9.9*  HCT 33.4* 30.1*  MCV 92.3 90.9  PLT 230 190   Cardiac Enzymes: No results for input(s): CKTOTAL, CKMB, CKMBINDEX, TROPONINI in the last 168 hours. BNP: Invalid input(s): POCBNP CBG: Recent Labs  Lab 10/07/17 1010 10/13/17 1032 10/13/17 1537  GLUCAP 104* 97 148*    Radiological Exams on Admission: No results found.  EKG: Independently reviewed. Paced rhythm, no ischemic changes  Time spent: 75 min  [facility] Pager [phone]  If 7PM-7AM, please contact night-coverage [email or web address] Password [facility] 10/13/2017, 6:55 PM

## 2017-10-14 ENCOUNTER — Encounter (HOSPITAL_COMMUNITY): Payer: Self-pay | Admitting: Surgery

## 2017-10-14 DIAGNOSIS — I248 Other forms of acute ischemic heart disease: Secondary | ICD-10-CM

## 2017-10-14 DIAGNOSIS — M25512 Pain in left shoulder: Secondary | ICD-10-CM

## 2017-10-14 LAB — GLUCOSE, CAPILLARY
Glucose-Capillary: 95 mg/dL (ref 65–99)
Glucose-Capillary: 132 mg/dL — ABNORMAL HIGH (ref 65–99)
Glucose-Capillary: 101 mg/dL — ABNORMAL HIGH (ref 65–99)
Glucose-Capillary: 120 mg/dL — ABNORMAL HIGH (ref 65–99)

## 2017-10-14 LAB — BASIC METABOLIC PANEL
Anion gap: 6 (ref 5–15)
BUN: 41 mg/dL — ABNORMAL HIGH (ref 6–20)
CALCIUM: 8 mg/dL — AB (ref 8.9–10.3)
CO2: 20 mmol/L — ABNORMAL LOW (ref 22–32)
Chloride: 117 mmol/L — ABNORMAL HIGH (ref 101–111)
Creatinine, Ser: 2.79 mg/dL — ABNORMAL HIGH (ref 0.61–1.24)
GFR calc Af Amer: 23 mL/min — ABNORMAL LOW (ref >60)
GFR calc non Af Amer: 20 mL/min — ABNORMAL LOW (ref >60)
Glucose, Bld: 107 mg/dL — ABNORMAL HIGH (ref 65–99)
Potassium: 4.7 mmol/L (ref 3.5–5.1)
SODIUM: 143 mmol/L (ref 135–145)

## 2017-10-14 LAB — TROPONIN I
TROPONIN I: 1.38 ng/mL — AB (ref <0.03)
TROPONIN I: 1.93 ng/mL — AB (ref <0.03)
TROPONIN I: 2.14 ng/mL — AB (ref <0.03)
Troponin I: 1.96 ng/mL (ref <0.03)

## 2017-10-14 LAB — CBC
HEMATOCRIT: 29.3 % — AB (ref 39.0–52.0)
Hemoglobin: 9.6 g/dL — ABNORMAL LOW (ref 13.0–17.0)
MCH: 30.2 pg (ref 26.0–34.0)
MCHC: 32.8 g/dL (ref 30.0–36.0)
MCV: 92.1 fL (ref 78.0–100.0)
PLATELETS: 173 10*3/uL (ref 150–400)
RBC: 3.18 MIL/uL — ABNORMAL LOW (ref 4.22–5.81)
RDW: 16.7 % — AB (ref 11.5–15.5)
WBC: 9.2 10*3/uL (ref 4.0–10.5)

## 2017-10-14 LAB — PHOSPHORUS: PHOSPHORUS: 5 mg/dL — AB (ref 2.5–4.6)

## 2017-10-14 LAB — MAGNESIUM: MAGNESIUM: 1.4 mg/dL — AB (ref 1.7–2.4)

## 2017-10-14 MED ORDER — MAGNESIUM SULFATE 2 GM/50ML IV SOLN
2.0000 g | Freq: Once | INTRAVENOUS | Status: AC
  Administered 2017-10-14: 2 g via INTRAVENOUS
  Filled 2017-10-14: qty 50

## 2017-10-14 MED ORDER — TRIAMCINOLONE ACETONIDE 0.1 % EX CREA
1.0000 "application " | TOPICAL_CREAM | Freq: Two times a day (BID) | CUTANEOUS | Status: DC
  Administered 2017-10-14 – 2017-10-15 (×2): 1 via TOPICAL
  Filled 2017-10-14: qty 15

## 2017-10-14 MED ORDER — FUROSEMIDE 40 MG PO TABS: 40.0000 mg | ORAL_TABLET | Freq: Every day | ORAL | Status: DC | PRN

## 2017-10-14 NOTE — Progress Notes (Signed)
Received patient from ICU. Settled in to room and oriented. No complaints at present. Condition stable.Eulas Post, RN

## 2017-10-14 NOTE — Progress Notes (Addendum)
PROGRESS NOTE    Rodney Farley  JFH:[id number] DOB: 1932/01/22 DOA: 10/13/2017 PCP: [name], MD   Brief Narrative: Patient is a 84 year old male with past medical history of CAD, hypertension, hyperlipidemia, diabetes type 2, CKD stage IV, CHF status post ICD, COPD who underwent laparoscopic cholecystectomy on 4/3 for symptomatic gallstones.  His surgery was prolonged due to numerous adhesions and he was under general anesthesia for 2 hours.  Postoperatively, his blood pressure was elevated and he was admitted to stepdown for close observation .Medicine was consulted for management of chronic medical conditions.  This morning his troponin was elevated.  Cardiology consulted.  Assessment & Plan:   Principal Problem:   Coronary artery disease involving native coronary artery of native heart with angina pectoris ([place]) Active Problems:   Controlled type 2 diabetes mellitus with stage 4 chronic kidney disease (HCC)   HTN (hypertension)   Chronic kidney disease (CKD), stage IV (severe) (HCC)   Chronic systolic CHF (congestive heart failure) (HCC)   COPD (chronic obstructive pulmonary disease) (HCC)   S/P cholecystectomy   Symptomatic cholelithiasis   Epistaxis   Left shoulder pain  Elevated troponin: Most likely secondary to supply demand ischemia.  He has history of coronary artery disease.  Cardiology following.  No plan for stress test or cath for now.  Continue medical management.  Denies any chest pain.  EKG did not show any acute ischemic changes.  Cycle troponins.Will check echocardiogram. He was complaining of neck and left shoulder pain yesterday but the pain has resolved now. I think patient can be transferred to telemetry.  History of coronary disease: Follows with cardiology as an outpatient.  History of MI, status post stents x3 in 2016. continue aspirin, statin and beta-blocker.  Hypertension: Currently blood pressure stable.  Continue his home medication.  He  is on carvedilol and ACE inhibitor.  Continue as needed hydralazine.  Hyperlipidemia: Continue statin.  History of systolic  heart failure:Prior ejection fraction of 25% but last echocardiogram in 2017 shows ejection fraction of 60% post biventricular upgrade on pacemaker also with ICD. Resume Lasix.  Continue ACE inhibitors and beta-blockers.  COPD:Currently stable.  Lungs are clear to auscultation.  Continue as needed beta agonist.  Diabetes type 2: Hemoglobin A1c was 5.6 on 10/07/17.  Continue sliding scale insulin.  Chronic kidney disease stage IV: Baseline creatinine around 2.5. Kidney function on baseline.   Avoid nephrotoxins.  Symptomatic gallstones: Status post laparoscopic cholecystectomy on 10/13/17 .Management as per general surgery.  Epistaxis: Resolved.  Follow-up  as an outpatient with ENT.   DVT prophylaxis: SCD Code Status: Full Family Communication: None present at the bedside today  Procedures:S/P laparoscopic cholecystectomy  Antimicrobials: None  Subjective: Patient seen and examined the bedside this morning.  He was comfortable.  Was sitting in the chair.  Denies any chest pain.  No new complaints.  Objective: Vitals:   10/14/17 0900 10/14/17 1000 10/14/17 1100 10/14/17 1200  BP:    (!) 115/41  Pulse:      Resp:      Temp:    (!) 97.4 F (36.3 C)  TempSrc:    Oral  SpO2: 95% 95% 98% (!) 89%  Weight:      Height:        Intake/Output Summary (Last 24 hours) at 10/14/2017 1407 Last data filed at 10/14/2017 1200 Gross per 24 hour  Intake 2870 ml  Output 950 ml  Net 1920 ml   Filed Weights   10/13/17 1034  Weight: 97.5 kg (215 lb)    Examination:  General exam: Appears calm and comfortable ,Not in distress,obese, elderly gentleman HEENT:PERRL,Oral mucosa moist, Ear/Nose normal on gross exam Respiratory system: Bilateral equal air entry, normal vesicular breath sounds, no wheezes or crackles  Cardiovascular system: S1 & S2 heard, RRR. No JVD,  murmurs, rubs, gallops or clicks. No pedal edema.ICD Gastrointestinal system: Abdomen is nondistended, soft and nontender. No organomegaly or masses felt. Normal bowel sounds heard.Clean surgical scars Central nervous system: Alert and oriented. No focal neurological deficits. Extremities: No edema, no clubbing ,no cyanosis, distal peripheral pulses palpable. Skin: No rashes, lesions or ulcers,no icterus ,no pallor MSK: Normal muscle bulk,tone ,power Psychiatry: Judgement and insight appear normal. Mood & affect appropriate.     Data Reviewed: I have personally reviewed following labs and imaging studies  CBC: Recent Labs  Lab 10/13/17 1658 10/14/17 0513  WBC 11.6* 9.2  HGB 9.9* 9.6*  HCT 30.1* 29.3*  MCV 90.9 92.1  PLT 190 601   Basic Metabolic Panel: Recent Labs  Lab 10/13/17 1658 10/14/17 0513  NA  --  143  K  --  4.7  CL  --  117*  CO2  --  20*  GLUCOSE  --  107*  BUN  --  41*  CREATININE 2.58* 2.79*  CALCIUM  --  8.0*  MG  --  1.4*  PHOS  --  5.0*   GFR: Estimated Creatinine Clearance: 22.3 mL/min (A) (by C-G formula based on SCr of 2.79 mg/dL (H)). Liver Function Tests: No results for input(s): AST, ALT, ALKPHOS, BILITOT, PROT, ALBUMIN in the last 168 hours. No results for input(s): LIPASE, AMYLASE in the last 168 hours. No results for input(s): AMMONIA in the last 168 hours. Coagulation Profile: Recent Labs  Lab 10/13/17 1054  INR 0.97   Cardiac Enzymes: Recent Labs  Lab 10/13/17 2200 10/14/17 0513 10/14/17 1035  TROPONINI 0.22* 1.38* 1.93*   BNP (last 3 results) No results for input(s): PROBNP in the last 8760 hours. HbA1C: No results for input(s): HGBA1C in the last 72 hours. CBG: Recent Labs  Lab 10/13/17 1032 10/13/17 1537 10/13/17 2218 10/14/17 0749 10/14/17 1116  GLUCAP 97 148* 95 95 132*   Lipid Profile: No results for input(s): CHOL, HDL, LDLCALC, TRIG, CHOLHDL, LDLDIRECT in the last 72 hours. Thyroid Function Tests: No  results for input(s): TSH, T4TOTAL, FREET4, T3FREE, THYROIDAB in the last 72 hours. Anemia Panel: No results for input(s): VITAMINB12, FOLATE, FERRITIN, TIBC, IRON, RETICCTPCT in the last 72 hours. Sepsis Labs: No results for input(s): PROCALCITON, LATICACIDVEN in the last 168 hours.  Recent Results (from the past 240 hour(s))  MRSA PCR Screening     Status: None   Collection Time: 10/13/17  4:58 PM  Result Value Ref Range Status   MRSA by PCR NEGATIVE NEGATIVE Final    Comment:        The GeneXpert MRSA Assay (FDA approved for NASAL specimens only), is one component of a comprehensive MRSA colonization surveillance program. It is not intended to diagnose MRSA infection nor to guide or monitor treatment for MRSA infections. Performed at [facility], [place] [address] [place], [place] [address]          Radiology Studies: No results found.      Scheduled Meds: . acetaminophen  1,000 mg Oral Q6H  . aspirin EC  81 mg Oral Daily  . atorvastatin  40 mg Oral q1800  . carvedilol  6.25 mg Oral BID  WC  . gabapentin  100 mg Oral QHS  . heparin injection (subcutaneous)  5,000 Units Subcutaneous Q8H  . insulin aspart  0-5 Units Subcutaneous QHS  . insulin aspart  0-9 Units Subcutaneous TID WC  . lisinopril  2.5 mg Oral Daily  . pantoprazole  40 mg Oral Daily  . polyethylene glycol  17 g Oral Daily  . sertraline  150 mg Oral Daily  . tamsulosin  0.4 mg Oral QPC supper   Continuous Infusions: . lactated ringers 100 mL/hr at 10/14/17 1200  . magnesium sulfate 1 - 4 g bolus IVPB       LOS: 1 day    Time spent:25 mins. More than 50% of that time was spent in counseling and/or coordination of care.  Please Note: This patient record was dictated using Editor, commissioning. Chart creation errors have been sought, but may not always have been located. Such creation errors do not reflect on the Standard of Medical Care.     Shelly Coss, MD [facility] Pager [phone]  If 7PM-7AM, please contact night-coverage [email or web address] Password [email or web address] 10/14/2017, 2:07 PM

## 2017-10-14 NOTE — Progress Notes (Signed)
Patient requesting hearing aids, no mention of them in belongings assessment. Asked patient if his son may have them. Pt unsure. Son called and brought them to bedside. Patient now wearing and is aware of belonging policy.

## 2017-10-14 NOTE — Progress Notes (Signed)
Cardiology aware of patient's last troponin increase to 1.93 from 1.38. RN to redraw at 1400. Patient started on ASA.

## 2017-10-14 NOTE — Consult Note (Addendum)
Cardiology Consultation:   Patient ID: Rodney Farley; [id number]; 1932-06-30   Admit date: 10/13/2017 Date of Consult: 10/14/2017  Primary Care Provider: [name], MD Primary Cardiologist: Oluwasemilore [name], MD  Primary Electrophysiologist:  Dr. Caryl Comes   Patient Profile:   Rodney Farley is a 84 y.o. male with a hx of CAD with MI in [place] 1994/1995, stents in 2016 X 3, CHB with PPM, upgraded to BiV, ICD MDT 2017, 2010, CKD-IV, DM, HTN, HLD, COPD, moderate sleep apnea and chronic systolic HF who is being seen today for the evaluation of elevated troponin after chole at the request of Dr. Dema Severin.  History of Present Illness:   Rodney Farley has a hx of CAD with MI in [place] 1994/1995, CHB with PPM, 2010, CKD-IV, DM, HTN, HLD, COPD, moderate sleep apnea and chronic systolic HF.   In 2016 he had PCI/DES  of mLAD, 2nd diag, and 3 OM.  Despite this EF remained 25-30%.  His PPM upgraded to BiVICD MDT 2017 by Dr. Caryl Comes.  July 2017 he had normalization of EF, and 06/2017 Device check with no arrhythmia and BIV pacing.     Hx of ruptured appendicitis with abscess resulting in ileocecectomy in 2018.  Also AKI at that time pk Cr to 5.75, but recent labs Cr 2.44.  Followed by nephrology.    On last visit with Dr. [name] pt needed cholecystectomy for large gallstones and contracted gallbladder on CT and U/S.  He was doing well and acceptable candidate for chole.     Pt underwent lap chole with lysis of adhesions 10/13/17.  Surgery lasted 2 hours due to lysis of multiple adhesions.   Pt was seen by [facility] post op to manage multiple chronic medical conditions.  He did complain of Lt neck and shoulder pain.   EKG was without acute changes, but troponin were drawn and found to be elevated.   His BP was elevated due to pain post op.  Prn hydralazine added.   It was also noted his lower ext edema had increased week prior to surgery and he did increase his lasix.   Also reported epistaxis, 4 times  over 2 months.  He has chronic chest discomfort when climbing steep hill back to his home from mailbox along with SOB, this is chronic.     EKG with AV biV pacing.  I personally reviewed.  Troponin 0.22; 1.38; 1.93  Na 143, K+ 4.7, BUN 41, Cr 2.79 (baseline 2.44)  Mg+ 1.4   Hgb 9.6 WBC 9.2, Plts 173  Pre-op cxr with cardiomegaly and no acute process.   Currently pain free, excerpt some abd pain.   BP has been down to 97 systolic.  Afebrile,  Feels well.  We discussed elevated troponin -will check Echo.  No SOB now, is healing well.   Past Medical History:  Diagnosis Date  . AICD (automatic cardioverter/defibrillator) present   . AKI (acute kidney injury) ([place]) 03/27/2017  . Anemia   . Anginal pain ([place])   . Anxiety   . Arm pain 05/08/2015   LEFT ARM  . Atrioventricular block, complete ([place])    a. 2010 s/p pacemaker.  . Bell's palsy    left side. after shingles episode  . Bilateral renal cysts 07/23/2017   Simple and hemorrhagic noted on CT ab/pelvis   . BPH associated with nocturia   . Cardiomegaly   . Chronic systolic CHF (congestive heart failure) ([place])    a. LVEF 35-40% by echo 09/2014.  [name]  CKD (chronic kidney disease), stage IV ([place])   . COPD (chronic obstructive pulmonary disease) ([facility])    Severe  . Coronary artery disease    a. s/p MI in 1994/1995 while in [place] s/p questionable PCI. 03/2015: progression of disease, for staged PCI.  [name] Depression   . Diabetic peripheral neuropathy ([place])   . Diarrhea   . Diverticulitis   . DOE (dyspnea on exertion) 03/28/2015  . Gallstones   . GERD (gastroesophageal reflux disease)   . Gout   . Hard of hearing   . History of chronic pancreatitis 07/23/2017   noted on CT abd/pelvis  . History of shingles   . Hypercholesterolemia   . Hypertension   . Ischemic cardiomyopathy   . MI (myocardial infarction) ([place]) 1994; 1995  . Neuropathy    IN LOWER EXTREMITIES  . Nosebleed 10/06/2017   for 2 months most recent 10/06/2017  .  Obesity   . Pacemaker    medtronic>>> MDT ICD 09/23/15  . Ruptured appendicitis   . Sleep apnea    "sleeps w/humidifyer when he panics and gets short of breath" (04/08/2015)  . TIA (transient ischemic attack) X 3  . Trigger middle finger of left hand   . Type II diabetes mellitus ([place])     Past Surgical History:  Procedure Laterality Date  . CARDIAC CATHETERIZATION N/A 03/29/2015   Procedure: Right/Left Heart Cath and Coronary Angiography;  Surgeon: Adan M [name], MD;  Location: [facility];  Service: Cardiovascular;  Laterality: N/A;  . [place]   "after my MI; put me on heart RX after cath"  . CARDIAC CATHETERIZATION N/A 04/09/2015   Procedure: Coronary Stent Intervention;  Surgeon: Carlen M [name], MD;  Location: [facility];  Service: Cardiovascular;  Laterality: N/A;  . CHOLECYSTECTOMY N/A 10/13/2017   Procedure: LAPAROSCOPIC CHOLECYSTECTOMY WITH LYSIS OF ADHESIONS;  Surgeon: Ileana Roup, MD;  Location: [facility];  Service: General;  Laterality: N/A;  . COLONOSCOPY    . DENTAL SURGERY    . EP IMPLANTABLE DEVICE N/A 09/23/2015   MDT CRT-D, Dr. Caryl Comes  . HIATAL HERNIA REPAIR  1977  . ILEOCECETOMY N/A 03/27/2017   Procedure: ILEOCECECTOMY;  Surgeon: Ileana Roup, MD;  Location: [facility];  Service: General;  Laterality: N/A;  . INSERT / REPLACE / REMOVE PACEMAKER  07/2008   Complete heart block status post DDD with good function  . LAPAROTOMY N/A 03/27/2017   Procedure: EXPLORATORY LAPAROTOMY;  Surgeon: Ileana Roup, MD;  Location: [facility];  Service: General;  Laterality: N/A;  . TONSILLECTOMY    . UPPER GI ENDOSCOPY       Home Medications:  Prior to Admission medications   Medication Sig Start Date End Date Taking? Authorizing Provider  albuterol (PROVENTIL HFA;VENTOLIN HFA) 108 (90 Base) MCG/ACT inhaler Inhale 2 puffs into the lungs every 6 (six) hours as needed for wheezing or shortness of breath. 08/12/17  Yes [name], MD    aspirin EC 81 MG tablet Take 81 mg by mouth daily.   Yes [provider]  atorvastatin (LIPITOR) 40 MG tablet Take 1 tablet (40 mg total) by mouth daily. 06/14/17  Yes [name], MD  carvedilol (COREG) 6.25 MG tablet Take 1 tablet (6.25 mg total) by mouth 2 (two) times daily with a meal. 06/19/17  Yes [name], MD  furosemide (LASIX) 40 MG tablet Take 1 tablet (40 mg total) by mouth as needed (take as needed for  swelling). 06/02/17 10/13/17 Yes Almyra Deforest, PA  gabapentin (NEURONTIN) 100 MG capsule Take 1 capsule (100 mg total) at bedtime by mouth. 05/24/17  Yes [name], MD  Insulin Glargine (LANTUS SOLOSTAR) 100 UNIT/ML Solostar Pen Inject 60 Units into the skin daily at 10 pm. Patient taking differently: Inject 40 Units into the skin daily at 10 pm.  07/01/17  Yes [name], MD  lisinopril (PRINIVIL,ZESTRIL) 2.5 MG tablet Take 1 tablet (2.5 mg total) by mouth daily. 06/18/17  Yes [name], MD  pantoprazole (PROTONIX) 40 MG tablet TAKE 1 TABLET BY MOUTH ONCE DAILY Patient taking differently: TAKE 1 TABLET (40 MG) BY MOUTH ONCE DAILY 03/08/17  Yes Farley, Faisal M, MD  Polyethylene Glycol 400 (BLINK TEARS) 0.25 % SOLN Place 1 drop into both eyes 3 (three) times daily.   Yes [provider]  sertraline (ZOLOFT) 100 MG tablet Take 1.5 tablets (150 mg total) by mouth daily. 03/25/17  Yes [name], MD  tamsulosin (FLOMAX) 0.4 MG CAPS capsule TAKE 1 CAPSULE BY MOUTH DAILY AFTER SUPPER Patient taking differently: Take 0.4 mg by mouth daily after supper.  03/25/17  Yes [name], MD  triamcinolone cream (KENALOG) 0.1 % Apply 1 application topically 2 (two) times daily. For 7-10 days maximum 05/11/17  Yes [name], MD  vitamin B-12 (CYANOCOBALAMIN) 1000 MCG tablet Take 1,000 mcg by mouth daily.   Yes [provider]  Blood Glucose Monitoring Suppl (FREESTYLE LITE) DEVI  02/04/17   [provider]  glucose blood  (FREESTYLE TEST STRIPS) test strip Use to check blood sugar daily 06/18/17   [name], MD  glucose monitoring kit (FREESTYLE) monitoring kit USE TO MONITOR BLOOD GLUCOSE AS DIRECTED 02/04/17   [name], MD  Insulin Pen Needle (UNIFINE PENTIPS) 31G X 5 MM MISC USE AS DIRECTED 01/28/17   [name], MD  nitroGLYCERIN (NITROSTAT) 0.4 MG SL tablet Place 1 tablet (0.4 mg total) under the tongue every 5 (five) minutes as needed for chest pain. 2 doses before calling for emergent help 03/25/17   [name], MD  polyethylene glycol [facility] / Floria Raveling) packet Take 17 g by mouth daily. Patient not taking: Reported on 10/13/2017 04/06/17   Shirley, [name], DO    Inpatient Medications: Scheduled Meds: . acetaminophen  1,000 mg Oral Q6H  . aspirin EC  81 mg Oral Daily  . atorvastatin  40 mg Oral q1800  . carvedilol  6.25 mg Oral BID WC  . gabapentin  100 mg Oral QHS  . heparin injection (subcutaneous)  5,000 Units Subcutaneous Q8H  . insulin aspart  0-5 Units Subcutaneous QHS  . insulin aspart  0-9 Units Subcutaneous TID WC  . lisinopril  2.5 mg Oral Daily  . pantoprazole  40 mg Oral Daily  . polyethylene glycol  17 g Oral Daily  . sertraline  150 mg Oral Daily  . tamsulosin  0.4 mg Oral QPC supper   Continuous Infusions: . lactated ringers 100 mL/hr at 10/14/17 0800   PRN Meds: albuterol, hydrALAZINE, HYDROmorphone (DILAUDID) injection, nitroGLYCERIN, ondansetron **OR** ondansetron (ZOFRAN) IV, simethicone, traMADol  Allergies:    Allergies  Allergen Reactions  . Bee Venom Anaphylaxis  . Lyrica [Pregabalin] Other (See Comments)    hallucinations  . Prednisone Other (See Comments)    hallucinations  . Zocor [Simvastatin] Nausea Only and Other (See Comments)    Headache with brand name only.  Can take the generic.  Social History:   Social History   Socioeconomic History  . Marital status: Married    Spouse name: Not on file  . Number of children: 1  .  Years of education: college  . Highest education level: Not on file  Occupational History  . Occupation: Retired  Scientific laboratory technician  . Financial resource strain: Not on file  . Food insecurity:    Worry: Not on file    Inability: Not on file  . Transportation needs:    Medical: Not on file    Non-medical: Not on file  Tobacco Use  . Smoking status: Former Smoker    Packs/day: 1.50    Years: 54.00    Pack years: 81.00    Types: Cigarettes    Last attempt to quit: 07/18/2007    Years since quitting: 10.2  . Smokeless tobacco: Never Used  . Tobacco comment: did not discuss LdCT but being evaluated for chole currently  Substance and Sexual Activity  . Alcohol use: Yes    Comment: 04/08/2015 "probably 3 drinks/month"  . Drug use: No  . Sexual activity: Never  Lifestyle  . Physical activity:    Days per week: Not on file    Minutes per session: Not on file  . Stress: Not on file  Relationships  . Social connections:    Talks on phone: Not on file    Gets together: Not on file    Attends religious service: Not on file    Active member of club or organization: Not on file    Attends meetings of clubs or organizations: Not on file    Relationship status: Not on file  . Intimate partner violence:    Fear of current or ex partner: Not on file    Emotionally abused: Not on file    Physically abused: Not on file    Forced sexual activity: Not on file  Other Topics Concern  . Not on file  Social History Narrative   Married 1994 (together since 1989) 1 son, 1 stepson. 3 grandkids, 6 great grandkids      Retired from [employer]. Had 2 years of collge.       Faith: Mormon    Family History:    Family History  Problem Relation Age of Onset  . Stroke Mother   . Leukemia Father   . Stroke Sister   . Heart attack Brother      ROS:  Please see the history of present illness.  General:no colds or fevers, no weight changes Skin:no rashes or ulcers HEENT:no blurred vision, no  congestion CV:see HPI PUL:see HPI GI:no diarrhea constipation or melena, no indigestion GU:no hematuria, no dysuria MS:no joint pain, no claudication, increased lower ext edema and had increased lasix to daily.  Neuro:no syncope, no lightheadedness Endo:+ diabetes, no thyroid disease  All other ROS reviewed and negative.     Physical Exam/Data:   Vitals:   10/14/17 0700 10/14/17 0800 10/14/17 0900 10/14/17 1000  BP:  (!) 126/55    Pulse:      Resp: 17 17    Temp:  98.4 F (36.9 C)    TempSrc:  Oral    SpO2: 95% 96% 95% 95%  Weight:      Height:        Intake/Output Summary (Last 24 hours) at 10/14/2017 1129 Last data filed at 10/14/2017 0800 Gross per 24 hour  Intake 3720 ml  Output 950 ml  Net 2770 ml   Filed  Weights   10/13/17 1034  Weight: 215 lb (97.5 kg)   Body mass index is 33.67 kg/m.  General:  Well nourished, well developed, in no acute distress HEENT: normal wears bil hearing aids Lymph: no adenopathy Neck: no JVD Endocrine:  No thryomegaly Vascular: No carotid bruits; 2+ pedal pulses bil   Cardiac:  normal S1, S2; RRR; no murmur gallup rub or click  Lungs:  Bilateral breath sounds to auscultation bilaterally, no wheezing, rhonchi few rales  Abd: soft, nontender, no hepatomegaly , incisions without erythema  Ext: no edema Musculoskeletal:  No deformities, BUE and BLE strength normal and equal Skin: warm and dry  Neuro:  CNs 2-12 intact, no focal abnormalities noted Psych:  Normal affect   Telemetry:  Telemetry was personally reviewed and demonstrates:  AV, BiV pacing  Relevant CV Studies: ABIs 09/08/17  Final Interpretation: Right: Resting right ankle-brachial index is within normal range. No evidence of significant right lower extremity arterial disease. The right toe-brachial index is normal. Left: Resting left ankle-brachial index is within normal range. No evidence of significant left lower extremity arterial disease. The left toe-brachial index is  abnormal.  Echo 01/23/16 Study Conclusions  - Left ventricle: The cavity size was normal. Wall thickness was   increased in a pattern of mild LVH. Systolic function was normal.   The estimated ejection fraction was in the range of 60% to 65%.   Doppler parameters are consistent with abnormal left ventricular   relaxation (grade 1 diastolic dysfunction).   Laboratory Data:  Chemistry Recent Labs  Lab 10/13/17 1658 10/14/17 0513  NA  --  143  K  --  4.7  CL  --  117*  CO2  --  20*  GLUCOSE  --  107*  BUN  --  41*  CREATININE 2.58* 2.79*  CALCIUM  --  8.0*  GFRNONAA 21* 20*  GFRAA 25* 23*  ANIONGAP  --  6    No results for input(s): PROT, ALBUMIN, AST, ALT, ALKPHOS, BILITOT in the last 168 hours. Hematology Recent Labs  Lab 10/13/17 1658 10/14/17 0513  WBC 11.6* 9.2  RBC 3.31* 3.18*  HGB 9.9* 9.6*  HCT 30.1* 29.3*  MCV 90.9 92.1  MCH 29.9 30.2  MCHC 32.9 32.8  RDW 16.4* 16.7*  PLT 190 173   Cardiac Enzymes Recent Labs  Lab 10/13/17 2200 10/14/17 0513 10/14/17 1035  TROPONINI 0.22* 1.38* 1.93*   No results for input(s): TROPIPOC in the last 168 hours.  BNPNo results for input(s): BNP, PROBNP in the last 168 hours.  DDimer No results for input(s): DDIMER in the last 168 hours.  Radiology/Studies:  No results found.  Assessment and Plan:   1. Elevated troponin post op with neck and shoulder pain.  Also with HTN at that time.  Known CAD with last cath 2016 with 3 stents placed.  Troponin now 1.93 on ASA -will check Echo.  Demand ischemia - no heparin IV .  Dr. Sallyanne Kuster to see, no further neck or shoulder pain.  Asa alone  On BB and ACE  ? Transfer to tele vs another night stepdown?   2.         Chronic systolic HF with prior EF 25% but last echo 2017 with EF 60% this was post BiV upgrade on PPM  Also with ICD.  MDT.  Has been having some increase of lower ext edema prior to surgery.   Few rales this am-  Would resume po lasix   3.  POD #1 lap chole.   Doing well.   4.        HTN elevated post op but now controlled to borderline.  5.        HLD on statin on lipitor 40 mg    For questions or updates, please contact Rodney Farley Please consult [email or web address] for contact info under Cardiology/STEMI.   Signed, Rodney Kicks, NP  10/14/2017 11:29 AM  I have seen and examined the patient along with Rodney Kicks, NP .  I have reviewed the chart, notes and new data.  I agree with PA/NP's note.  Key new complaints: Currently asymptomatic.  He has difficulty remembering his exact pattern of angina with previous infarction, but believes it was distinct from yesterday's left shoulder and left neck discomfort.  He said the symptoms were somewhat pleuritic and suggestive of phrenic nerve referred to diaphragmatic discomfort following laparoscopy. Key examination changes: Lying fully flat in bed without any discomfort.  No signs of hypervolemia, normal cardiovascular exam. Key new findings / data: Nondiagnostic ECG due to ventricular pacing.  Mildly elevated cardiac troponin.  Moderately elevated creatinine consistent with chronic kidney disease stage IV, only slightly above his baseline.[name]  PLAN: Suspect Mr. Doyon had demand myocardial injury perioperatively.  I doubt that he had a true atherothrombotic acute coronary syndrome.   I do not think heparin is indicated, nor is cardiac catheterization.  The risks of cardiac catheterization, including contrast nephrotoxicity and progression to kidney failure, outweigh the potential benefits in my opinion. We will check an echocardiogram.  If there are new extensive regional wall motion abnormalities or a major drop in left ventricular systolic function, may consider elective cardiac catheterization once he is over his surgery and we have optimize renal parameters.  Otherwise, we will continue medical management for chronic CAD and CHF.  Rodney Klein, MD, [place] [phone] 10/14/2017, 1:07  PM

## 2017-10-14 NOTE — Progress Notes (Signed)
Subjective Denies any complaints this morning. Had some left shoulder pain yesterday. Denies n/v. Denies CP/SOB. Tolerating liquids.  Objective: Vital signs in last 24 hours: Temp:  [97.4 F (36.3 C)-98.8 F (37.1 C)] 98.7 F (37.1 C) (04/04 0310) Pulse Rate:  [69-77] 77 (04/03 1600) Resp:  [10-23] 19 (04/04 0400) BP: (117-187)/(46-84) 136/48 (04/04 0400) SpO2:  [94 %-100 %] 96 % (04/04 0400) Arterial Line BP: (112-191)/(37-68) 134/42 (04/04 0400) Weight:  [97.5 kg (215 lb)] 97.5 kg (215 lb) (04/03 1034)    Intake/Output from previous day: 04/03 0701 - 04/04 0700 In: 3420 [I.V.:3070; IV Piggyback:350] Out: 950 [Urine:900; Blood:50] Intake/Output this shift: No intake/output data recorded.  Gen: NAD, comfortable CV: RRR Pulm: Normal work of breathing  Abd: Soft, NT/ND; incisions c/d/i without erythema Ext: SCDs in place  Lab Results: CBC  Recent Labs    10/13/17 1658 10/14/17 0513  WBC 11.6* 9.2  HGB 9.9* 9.6*  HCT 30.1* 29.3*  PLT 190 173   BMET Recent Labs    10/13/17 1658 10/14/17 0513  NA  --  143  K  --  4.7  CL  --  117*  CO2  --  20*  GLUCOSE  --  107*  BUN  --  41*  CREATININE 2.58* 2.79*  CALCIUM  --  8.0*   PT/INR Recent Labs    10/13/17 1054  LABPROT 12.8  INR 0.97   ABG No results for input(s): PHART, HCO3 in the last 72 hours.  Invalid input(s): PCO2, PO2  Studies/Results:  Anti-infectives: Anti-infectives (From admission, onward)   Start     Dose/Rate Route Frequency Ordered Stop   10/13/17 1030  ceFAZolin (ANCEF) IVPB 2g/100 mL premix     2 g 200 mL/hr over 30 Minutes Intravenous On call to O.R. 10/13/17 1020 10/13/17 1209       Assessment/Plan: Patient Active Problem List   Diagnosis Date Noted  . S/P cholecystectomy 10/13/2017  . Symptomatic cholelithiasis 10/13/2017  . Epistaxis 10/13/2017  . Left shoulder pain 10/13/2017  . Diarrhea 05/11/2017  . Ruptured appendicitis 03/26/2017  . Marital stress 02/18/2017  .  Trigger finger 06/10/2016  . BPH associated with nocturia 04/27/2016  . Diabetic peripheral neuropathy ([place])   . COPD (chronic obstructive pulmonary disease) ([place])   . Bell's palsy   . Gout 06/26/2015  . Chronic systolic CHF (congestive heart failure) ([place]) 04/08/2015  . Diverticulitis 04/08/2015  . DOE (dyspnea on exertion), due to significant CAD 03/28/2015  . Chronic kidney disease (CKD), stage IV (severe) ([place]) 03/04/2015  . Coronary artery disease involving native coronary artery of native heart with angina pectoris ([place]) 09/13/2014  . GERD (gastroesophageal reflux disease) 08/07/2012  . Major depression in full remission ([place]) 08/05/2012  . Sleep apnea 08/05/2012  . Controlled type 2 diabetes mellitus with stage 4 chronic kidney disease ([place]) 08/05/2012  . HTN (hypertension) 08/05/2012  . Ischemic cardiomyopathy 11/03/2011  . Atrioventricular block, complete ([place])   . Obesity   . Pacemaker    s/p Procedure(s): LAPAROSCOPIC CHOLECYSTECTOMY WITH LYSIS OF ADHESIONS 10/13/2017  -Advance to full liquids per pt preference - doesn't want to try solid foods just yet -Ambulate with assistance -Advance diet as tolerated -Medicine following for assistance in comorbidities; they are trending his troponins. He denies chest pain nor sob since OR. -PPx: SCDs, SQH -Dispo: Will defer monitoring location to medicine - ok for telemetry from my standpoint. Discharge pending toleration of diet and clearance for discharge by medicine.   LOS: 1  day   Sharon Mt. Dema Severin, M.D. General and Colorectal Surgery [facility]

## 2017-10-14 NOTE — Progress Notes (Signed)
Patient trialed off of O2 and had normal saturations until he fell asleep, 2L [place] replaced for duration of his nap.

## 2017-10-14 NOTE — Plan of Care (Signed)
  Problem: Education: Goal: Knowledge of General Education information will improve Outcome: Progressing   Problem: Health Behavior/Discharge Planning: Goal: Ability to manage health-related needs will improve Outcome: Progressing   Problem: Clinical Measurements: Goal: Ability to maintain clinical measurements within normal limits will improve Outcome: Progressing Goal: Will remain free from infection Outcome: Progressing Goal: Diagnostic test results will improve Outcome: Progressing Goal: Respiratory complications will improve Outcome: Progressing Goal: Cardiovascular complication will be avoided Outcome: Progressing   Problem: Activity: Goal: Risk for activity intolerance will decrease Outcome: Progressing   Problem: Nutrition: Goal: Adequate nutrition will be maintained Outcome: Progressing   Problem: Coping: Goal: Level of anxiety will decrease Outcome: Progressing   Problem: Elimination: Goal: Will not experience complications related to bowel motility Outcome: Progressing   Problem: Pain Managment: Goal: General experience of comfort will improve Outcome: Progressing   Problem: Safety: Goal: Ability to remain free from injury will improve Outcome: Progressing   Problem: Skin Integrity: Goal: Risk for impaired skin integrity will decrease Outcome: Progressing

## 2017-10-15 ENCOUNTER — Inpatient Hospital Stay (HOSPITAL_COMMUNITY): Payer: PPO

## 2017-10-15 DIAGNOSIS — I214 Non-ST elevation (NSTEMI) myocardial infarction: Secondary | ICD-10-CM | POA: Insufficient documentation

## 2017-10-15 DIAGNOSIS — R748 Abnormal levels of other serum enzymes: Secondary | ICD-10-CM

## 2017-10-15 DIAGNOSIS — R778 Other specified abnormalities of plasma proteins: Secondary | ICD-10-CM | POA: Diagnosis present

## 2017-10-15 DIAGNOSIS — R7989 Other specified abnormal findings of blood chemistry: Secondary | ICD-10-CM | POA: Diagnosis present

## 2017-10-15 DIAGNOSIS — I340 Nonrheumatic mitral (valve) insufficiency: Secondary | ICD-10-CM

## 2017-10-15 LAB — TROPONIN I
Troponin I: 1.16 ng/mL (ref <0.03)
Troponin I: 1.07 ng/mL (ref <0.03)

## 2017-10-15 LAB — BASIC METABOLIC PANEL
Anion gap: 7 (ref 5–15)
BUN: 39 mg/dL — AB (ref 6–20)
CALCIUM: 8.4 mg/dL — AB (ref 8.9–10.3)
CO2: 22 mmol/L (ref 22–32)
CREATININE: 2.96 mg/dL — AB (ref 0.61–1.24)
Chloride: 113 mmol/L — ABNORMAL HIGH (ref 101–111)
GFR calc Af Amer: 21 mL/min — ABNORMAL LOW (ref >60)
GFR, EST NON AFRICAN AMERICAN: 18 mL/min — AB (ref >60)
GLUCOSE: 136 mg/dL — AB (ref 65–99)
POTASSIUM: 4.8 mmol/L (ref 3.5–5.1)
Sodium: 142 mmol/L (ref 135–145)

## 2017-10-15 LAB — ECHOCARDIOGRAM COMPLETE
HEIGHTINCHES: 67 in
WEIGHTICAEL: 3440 [oz_av]

## 2017-10-15 LAB — GLUCOSE, CAPILLARY
GLUCOSE-CAPILLARY: 148 mg/dL — AB (ref 65–99)
Glucose-Capillary: 139 mg/dL — ABNORMAL HIGH (ref 65–99)
Glucose-Capillary: 120 mg/dL — ABNORMAL HIGH (ref 65–99)
Glucose-Capillary: 108 mg/dL — ABNORMAL HIGH (ref 65–99)

## 2017-10-15 LAB — HEPARIN LEVEL (UNFRACTIONATED): Heparin Unfractionated: 0.17 IU/mL — ABNORMAL LOW (ref 0.30–0.70)

## 2017-10-15 LAB — MAGNESIUM: Magnesium: 1.8 mg/dL (ref 1.7–2.4)

## 2017-10-15 MED ORDER — HEPARIN (PORCINE) IN NACL 100-0.45 UNIT/ML-% IJ SOLN
1500.0000 [IU]/h | INTRAMUSCULAR | Status: DC
  Filled 2017-10-15: qty 250

## 2017-10-15 MED ORDER — ISOSORBIDE MONONITRATE ER 30 MG PO TB24
30.0000 mg | ORAL_TABLET | Freq: Every day | ORAL | Status: DC
  Administered 2017-10-15 – 2017-10-17 (×3): 30 mg via ORAL
  Filled 2017-10-15 (×3): qty 1

## 2017-10-15 MED ORDER — PERFLUTREN LIPID MICROSPHERE
1.0000 mL | INTRAVENOUS | Status: AC | PRN
  Administered 2017-10-15: 4.5 mL via INTRAVENOUS
  Filled 2017-10-15: qty 10

## 2017-10-15 MED ORDER — TRAMADOL HCL 50 MG PO TABS: 50.0000 mg | ORAL_TABLET | Freq: Four times a day (QID) | ORAL | 0 refills | Status: AC | PRN

## 2017-10-15 MED ORDER — HEPARIN (PORCINE) IN NACL 100-0.45 UNIT/ML-% IJ SOLN
1200.0000 [IU]/h | INTRAMUSCULAR | Status: DC
  Administered 2017-10-15: 1200 [IU]/h via INTRAVENOUS
  Filled 2017-10-15: qty 250

## 2017-10-15 MED ORDER — LISINOPRIL 5 MG PO TABS
2.5000 mg | ORAL_TABLET | Freq: Every day | ORAL | Status: DC
  Administered 2017-10-16 – 2017-10-17 (×2): 2.5 mg via ORAL
  Filled 2017-10-15 (×2): qty 1

## 2017-10-15 MED ORDER — FUROSEMIDE 40 MG PO TABS
40.0000 mg | ORAL_TABLET | Freq: Every day | ORAL | Status: DC
  Administered 2017-10-15 – 2017-10-17 (×3): 40 mg via ORAL
  Filled 2017-10-15 (×3): qty 1

## 2017-10-15 NOTE — Discharge Instructions (Addendum)
POST OP INSTRUCTIONS  1. DIET: As tolerated.  2. Take your usually prescribed home medications unless otherwise directed.  3. PAIN CONTROL: a. Pain is best controlled by a usual combination of three different methods TOGETHER: i. Ice/Heat ii. Over the counter pain medication iii. Prescription pain medication b. Most patients will experience some swelling and bruising around the surgical site.  Ice packs or heating pads (30-60 minutes up to 6 times a day) will help. Some people prefer to use ice alone, heat alone, alternating between ice & heat.  Experiment to what works for you.  Swelling and bruising can take several weeks to resolve.   c. It is helpful to take an over-the-counter pain medication regularly for the first few weeks: i. Acetaminophen (Tylenol) - you may take 650mg  every 6 hours as needed. You can take this with motrin as they act differently on the body. If you are taking a narcotic pain medication that has acetaminophen in it, do not take over the counter tylenol at the same time. d. A  prescription for pain medication should be given to you upon discharge.  Take your pain medication as prescribed if your pain is not adequatly controlled with the over-the-counter pain reliefs mentioned above.  4. Avoid getting constipated.  Between the surgery and the pain medications, it is common to experience some constipation.  Increasing fluid intake and taking a fiber supplement (such as Metamucil, Citrucel, FiberCon, MiraLax, etc) 1-2 times a day regularly will usually help prevent this problem from occurring.  A mild laxative (prune juice, Milk of Magnesia, MiraLax, etc) should be taken according to package directions if there are no bowel movements after 48 hours.    5. Dressing: Your incisions are covered in Dermabond which is like sterile superglue for the skin. This will come off on it's own in a couple weeks. It is waterproof and you may bathe normally starting the day after your  surgery in a shower. Avoid baths/pools/lakes/oceans until your wounds have fully healed.  6. ACTIVITIES as tolerated:   a. Avoid heavy lifting (>10lbs or 1 gallon of milk) for the next 6 weeks. b. You may resume regular (light) daily activities beginning the next day--such as daily self-care, walking, climbing stairs--gradually increasing activities as tolerated.  If you can walk 30 minutes without difficulty, it is safe to try more intense activity such as jogging, treadmill, bicycling, low-impact aerobics.  c. DO NOT PUSH THROUGH PAIN.  Let pain be your guide: If it hurts to do something, don't do it. d. Rodney Farley may drive when you are no longer taking prescription pain medication, you can comfortably wear a seatbelt, and you can safely maneuver your car and apply brakes. e. Rodney Farley may have sexual intercourse when it is comfortable.   7. FOLLOW UP in our office a. Please call [facility] at [phone] to set up an appointment to see your surgeon in the office for a follow-up appointment approximately 2 weeks after your surgery. b. Make sure that you call for this appointment the day you arrive home to insure a convenient appointment time.  9. If you have disability or family leave forms that need to be completed, you may have them completed by your primary care physician's office; for return to work instructions, please ask our office staff and they will be happy to assist you in obtaining this documentation   When to call us [phone]: 1. Poor pain control 2. Reactions / problems with new medications (rash/itching, etc)  3. Fever over 101.5 F (38.5 C) 4. Inability to urinate 5. Nausea/vomiting 6. Worsening swelling or bruising 7. Continued bleeding from incision. 8. Increased pain, redness, or drainage from the incision  The clinic staff is available to answer your questions during regular business hours (8:30am-5pm).  Please dont hesitate to call and ask to speak to one of our nurses for  clinical concerns.   A surgeon from [facility] is always on call at the hospitals   If you have a medical emergency, go to the nearest emergency room or call 911.  [facility], [place] [address], [place], [place], [place]  [address] MAIN: [phone] FAX: [phone] [email or web address]

## 2017-10-15 NOTE — Progress Notes (Signed)
[place] for Heparin Indication: chest pain/ACS  Allergies  Allergen Reactions  . Bee Venom Anaphylaxis  . Lyrica [Pregabalin] Other (See Comments)    hallucinations  . Prednisone Other (See Comments)    hallucinations  . Zocor [Simvastatin] Nausea Only and Other (See Comments)    Headache with brand name only.  Can take the generic.   Patient Measurements: Height: 5\' 7"  (170.2 cm) Weight: 215 lb (97.5 kg) IBW/kg (Calculated) : 66.1 Heparin Dosing Weight: 87.1 kg  Vital Signs: Temp: 98.2 F (36.8 C) (04/05 2131) Temp Source: Oral (04/05 2131) BP: 120/45 (04/05 1237) Pulse Rate: 71 (04/05 2131)  Labs: Recent Labs    10/13/17 1054 10/13/17 1658  10/14/17 0513  10/14/17 1934 10/15/17 0624 10/15/17 1020 10/15/17 1522 10/15/17 2259  HGB  --  9.9*  --  9.6*  --   --   --   --   --   --   HCT  --  30.1*  --  29.3*  --   --   --   --   --   --   PLT  --  190  --  173  --   --   --   --   --   --   APTT 31  --   --   --   --   --   --   --   --   --   LABPROT 12.8  --   --   --   --   --   --   --   --   --   INR 0.97  --   --   --   --   --   --   --   --   --   HEPARINUNFRC  --   --   --   --   --   --   --   --   --  0.17*  CREATININE  --  2.58*  --  2.79*  --   --  2.96*  --   --   --   TROPONINI  --   --    < > 1.38*   < > 1.96*  --  1.16* 1.07*  --    < > = values in this interval not displayed.   Estimated Creatinine Clearance: 21 mL/min (A) (by C-G formula based on SCr of 2.96 mg/dL (H)).  Medications:  Scheduled:  . acetaminophen  1,000 mg Oral Q6H  . aspirin EC  81 mg Oral Daily  . atorvastatin  40 mg Oral q1800  . carvedilol  6.25 mg Oral BID WC  . furosemide  40 mg Oral Daily  . gabapentin  100 mg Oral QHS  . insulin aspart  0-5 Units Subcutaneous QHS  . insulin aspart  0-9 Units Subcutaneous TID WC  . isosorbide mononitrate  30 mg Oral Daily  . [START ON 10/16/2017] lisinopril  2.5 mg Oral Daily  . pantoprazole   40 mg Oral Daily  . polyethylene glycol  17 g Oral Daily  . sertraline  150 mg Oral Daily  . tamsulosin  0.4 mg Oral QPC supper  . triamcinolone cream  1 application Topical BID   Infusions:  . heparin    . lactated ringers Stopped (10/15/17 1024)   Assessment: 77 yoM s/p Lap cholecystectomy with LOA 4/3.  Progressing well post-op till c/o chest pain this am, with radiation to L  arm, elevated Troponins.  Begin Heparin infusion, Pharmacy to dose.   Discontinued SQ Heparin, last dose 1330 today  SCr elevated & increasing, 2.96 this am, CBC yesterday low, stable Plt wnl Today, 4/5 2259 HL=0.17 below goal, no bleeding issues. RN did stop heparin infusion for ~ 20 mins at 2000.  Goal of Therapy:  Heparin level 0.3-0.7 units/ml Monitor platelets by anticoagulation protocol: Yes   Plan:   Increase heparin drip to 1500 units/hr  Recheck HL in 8 hours  Daily CBC while on Heparin, daily Hep level when at steady state    Dorrene German 10/15/2017, 11:29 PM

## 2017-10-15 NOTE — Progress Notes (Signed)
Notified by lab that pt began to c/o sudden onset mid chest pain. Pt describes pain as "pressure" and radiating to his left shoulder/arm. Pt anxious and verbalizing that he feels as if he is going to faint. Nitro given x2 and EKG obtained and placed in chart. MD on call, Tamala Julian, updated on pt status. Pt verbalizes relief of pain after administration of nitro. Pt comfortable and in no acute distress at this time. Will continue to monitor.

## 2017-10-15 NOTE — Progress Notes (Addendum)
Progress Note  Patient Name: Rodney Farley Date of Encounter: 10/15/2017  Primary Cardiologist: Kingstyn [name], MD   Subjective   "I feel wonderful now"  I would like to go home.  If question of cath he would like Dr. [name] notified.    Inpatient Medications    Scheduled Meds: . acetaminophen  1,000 mg Oral Q6H  . aspirin EC  81 mg Oral Daily  . atorvastatin  40 mg Oral q1800  . carvedilol  6.25 mg Oral BID WC  . gabapentin  100 mg Oral QHS  . heparin injection (subcutaneous)  5,000 Units Subcutaneous Q8H  . insulin aspart  0-5 Units Subcutaneous QHS  . insulin aspart  0-9 Units Subcutaneous TID WC  . lisinopril  2.5 mg Oral Daily  . pantoprazole  40 mg Oral Daily  . polyethylene glycol  17 g Oral Daily  . sertraline  150 mg Oral Daily  . tamsulosin  0.4 mg Oral QPC supper  . triamcinolone cream  1 application Topical BID   Continuous Infusions: . lactated ringers 100 mL/hr at 10/15/17 0558   PRN Meds: albuterol, furosemide, hydrALAZINE, HYDROmorphone (DILAUDID) injection, nitroGLYCERIN, ondansetron **OR** ondansetron (ZOFRAN) IV, simethicone, traMADol   Vital Signs    Vitals:   10/14/17 2156 10/15/17 0424 10/15/17 0644 10/15/17 0821  BP: (!) 124/48 (!) 158/65 123/62 129/60  Pulse: 76 72 72 70  Resp: 18 20 20    Temp: 98.1 F (36.7 C) 98.3 F (36.8 C)    TempSrc: Oral Oral    SpO2: 94% 96% 94%   Weight:      Height:        Intake/Output Summary (Last 24 hours) at 10/15/2017 0914 Last data filed at 10/15/2017 0600 Gross per 24 hour  Intake 1650 ml  Output 800 ml  Net 850 ml   Filed Weights   10/13/17 1034  Weight: 215 lb (97.5 kg)    Telemetry    Av and BiV pacing - Personally Reviewed  ECG    AV BiV pacing.  - Personally Reviewed  Physical Exam   GEN: No acute distress.   Neck: No JVD Cardiac: RRR, no murmurs, rubs, or gallops.  Respiratory: Clear to few rales in bases to auscultation bilaterally. GI: Soft, nontender, non-distended    MS: No edema; No deformity. Neuro:  Nonfocal  Psych: Normal affect   Labs    Chemistry Recent Labs  Lab 10/13/17 1658 10/14/17 0513 10/15/17 0624  NA  --  143 142  K  --  4.7 4.8  CL  --  117* 113*  CO2  --  20* 22  GLUCOSE  --  107* 136*  BUN  --  41* 39*  CREATININE 2.58* 2.79* 2.96*  CALCIUM  --  8.0* 8.4*  GFRNONAA 21* 20* 18*  GFRAA 25* 23* 21*  ANIONGAP  --  6 7     Hematology Recent Labs  Lab 10/13/17 1658 10/14/17 0513  WBC 11.6* 9.2  RBC 3.31* 3.18*  HGB 9.9* 9.6*  HCT 30.1* 29.3*  MCV 90.9 92.1  MCH 29.9 30.2  MCHC 32.9 32.8  RDW 16.4* 16.7*  PLT 190 173    Cardiac Enzymes Recent Labs  Lab 10/14/17 0513 10/14/17 1035 10/14/17 1426 10/14/17 1934  TROPONINI 1.38* 1.93* 2.14* 1.96*   No results for input(s): TROPIPOC in the last 168 hours.   BNPNo results for input(s): BNP, PROBNP in the last 168 hours.   DDimer No results for input(s): DDIMER in the  last 168 hours.   Radiology    No results found.  Cardiac Studies   Echo pending, I called Echo lab to have done earlier than later  Patient Profile     84 y.o. male with a hx of CAD with MI in [place] 1994/1995, stents in 2016 X 3, CHB with PPM, upgraded to BiV, ICD MDT 2017, 2010, CKD-IV, DM, HTN, HLD, COPD, moderate sleep apnea and chronic systolic HF now with demand ischemia and elevated troponin post lap chole.  Recurrent chest pain this AM    Assessment & Plan    Demand ischemia with pk troponin at 2.14 with HTN, anesthesia. Echo pending  Unstable angina - recurrent chest pain this AM relief with NTG, EKG with AV pacing recheck troponin--may need cath is on BB and will add Imdur.  Will need to keep in hospital for now.  Pt on last cath had distal LCX at 70% this may be area causing angina.    Chronic systolic HF with prior EF 25% but last echo 2017 with EF 60% this was post BiV upgrade on PPM  Also with ICD.  MDT--echo pending.  Few fine rales, able to lie flat but will add lasix  40 mg daily at least for today due to  +3860 ml   HTN stable  HLD on statin continue   POD #2  Lap chole  CKD- 4    2.96 up from 2.79 and 2.58 adding lasix po today this is chronic      For questions or updates, please contact [facility] Please consult [email or web address] for contact info under Cardiology/STEMI.      Signed, Cecilie Kicks, NP  10/15/2017, 9:14 AM    I have seen and examined the patient along with Cecilie Kicks, NP.  I have reviewed the chart, notes and new data.  I agree with NP's note.  Key new complaints: He had an episode of angina highly reminiscent of his previous episodes of angina pectoris resolved promptly with sublingual nitroglycerin.  The episode happened at rest and was not associated with arrhythmia or any triggering event.  Denies dyspnea. Key examination changes: No clinical evidence of heart failure, no significant arrhythmia Key new findings / data: Cardiac troponin with declining trend, creatinine worsened now up to 2.96.  PLAN: Clinical symptoms of angina pectoris make it more likely that he actually does have an unstable coronary syndrome/ulcerated plaque, therefore at risk for further infarction.  We will start long-acting nitrates and intravenous heparin.  Awaiting echocardiogram images.  If he has a substantial new wall motion abnormality he should go directly to cardiac catheterization.  Unfortunately this will be associated with substantial risk for contrast nephrotoxicity and worsening renal function, therefore should not be undertaken lightly. If his echo does not show new wall motion abnormalities, I do believe he should undergo further risk stratification with a Lexiscan Myoview.  We can do this tomorrow morning. Although he is eager to go home, he understands the risk of potential reinfarction.  Although he is worried about his kidney function, he understands that we may be compelled to perform coronary angiography.  He also appreciates that we  will only pursue contrast based procedures after further discussion with him.  Sanda Klein, MD, [place] [phone] 10/15/2017, 2:13 PM

## 2017-10-15 NOTE — Progress Notes (Signed)
Echocardiogram 2D Echocardiogram with definity has been performed.  Darlina Sicilian M 10/15/2017, 11:46 AM

## 2017-10-15 NOTE — Progress Notes (Signed)
Subjective Denies any complaints this at this time. Had some chest pain and tightness that radiated to left shoulder early this morning. Cardiology involved and monitoring. Tolerating liquids - denies n/v.  Objective: Vital signs in last 24 hours: Temp:  [97.4 F (36.3 C)-98.3 F (36.8 C)] 98.3 F (36.8 C) (04/05 0424) Pulse Rate:  [70-76] 70 (04/05 0821) Resp:  [18-20] 20 (04/05 0644) BP: (114-164)/(41-83) 164/83 (04/05 1017) SpO2:  [89 %-98 %] 94 % (04/05 0644) Arterial Line BP: (75-157)/(51-72) 125/72 (04/04 1600) Last BM Date: 10/14/17  Intake/Output from previous day: 04/04 0701 - 04/05 0700 In: 1950 [I.V.:1900; IV Piggyback:50] Out: 800 [Urine:800] Intake/Output this shift: Total I/O In: 240 [P.O.:240] Out: 200 [Urine:200]  Gen: NAD, comfortable CV: RRR Pulm: Normal work of breathing  Abd: Soft, NT/ND; incisions c/d/i without erythema Ext: SCDs in place  Lab Results: CBC  Recent Labs    10/13/17 1658 10/14/17 0513  WBC 11.6* 9.2  HGB 9.9* 9.6*  HCT 30.1* 29.3*  PLT 190 173   BMET Recent Labs    10/14/17 0513 10/15/17 0624  NA 143 142  K 4.7 4.8  CL 117* 113*  CO2 20* 22  GLUCOSE 107* 136*  BUN 41* 39*  CREATININE 2.79* 2.96*  CALCIUM 8.0* 8.4*   PT/INR Recent Labs    10/13/17 1054  LABPROT 12.8  INR 0.97    Anti-infectives: Anti-infectives (From admission, onward)   Start     Dose/Rate Route Frequency Ordered Stop   10/13/17 1030  ceFAZolin (ANCEF) IVPB 2g/100 mL premix     2 g 200 mL/hr over 30 Minutes Intravenous On call to O.R. 10/13/17 1020 10/13/17 1209       Assessment/Plan: Patient Active Problem List   Diagnosis Date Noted  . S/P cholecystectomy 10/13/2017  . Symptomatic cholelithiasis 10/13/2017  . Epistaxis 10/13/2017  . Left shoulder pain 10/13/2017  . Diarrhea 05/11/2017  . Ruptured appendicitis 03/26/2017  . Marital stress 02/18/2017  . Trigger finger 06/10/2016  . BPH associated with nocturia 04/27/2016  .  Diabetic peripheral neuropathy ([place])   . COPD (chronic obstructive pulmonary disease) ([place])   . Bell's palsy   . Gout 06/26/2015  . Chronic systolic CHF (congestive heart failure) ([place]) 04/08/2015  . Diverticulitis 04/08/2015  . DOE (dyspnea on exertion), due to significant CAD 03/28/2015  . Chronic kidney disease (CKD), stage IV (severe) ([place]) 03/04/2015  . Coronary artery disease involving native coronary artery of native heart with angina pectoris ([place]) 09/13/2014  . GERD (gastroesophageal reflux disease) 08/07/2012  . 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 Sleep apnea 08/05/2012  . Controlled type 2 diabetes mellitus with stage 4 chronic kidney disease ([place]) 08/05/2012  . HTN (hypertension) 08/05/2012  . Ischemic cardiomyopathy 11/03/2011  . Atrioventricular block, complete ([place])   . Obesity   . Pacemaker    s/p Procedure(s): LAPAROSCOPIC CHOLECYSTECTOMY WITH LYSIS OF ADHESIONS 10/13/2017  -Heart healthy diet as tolerated -Ambulate with assistance -Medicine & cardiology following for assistance in comorbidities; they are trending his troponins. -Cr bumped in setting of CKD up to 2.96 today; repeat BMP tomorrow -PPx: SCDs, SQH -Dispo: Telemetry. Monitor today. Will remain in house until cleared for discharge by medicine and cardiology   LOS: 2 days   Sharon Mt. Dema Severin, M.D. General and Colorectal Surgery [facility]

## 2017-10-15 NOTE — Progress Notes (Signed)
PROGRESS NOTE    Rodney Farley  QZR:[id number] DOB: 08-Aug-1931 DOA: 10/13/2017 PCP: [name], MD   Brief Narrative: Patient is a 84 year old male with past medical history of CAD, hypertension, hyperlipidemia, diabetes type 2, CKD stage IV, CHF status post ICD, COPD who underwent laparoscopic cholecystectomy on 4/3 for symptomatic gallstones.  His surgery was prolonged due to numerous adhesions and he was under general anesthesia for 2 hours.  Postoperatively, his blood pressure was elevated and he was admitted to stepdown for close observation .Medicine was consulted for management of left sided chest pain and chronic medical conditions.  Patient's troponin found to be elevated.  Cardiology consulted.  Assessment & Plan:   Principal Problem:   NSTEMI (non-ST elevated myocardial infarction) ([place]) Active Problems:   Controlled type 2 diabetes mellitus with stage 4 chronic kidney disease (HCC)   HTN (hypertension)   Coronary artery disease involving native coronary artery of native heart with angina pectoris (HCC)   Chronic kidney disease (CKD), stage IV (severe) (HCC)   Chronic systolic CHF (congestive heart failure) (HCC)   COPD (chronic obstructive pulmonary disease) (HCC)   S/P cholecystectomy   Symptomatic cholelithiasis   Epistaxis   Left shoulder pain    Elevated troponin/unstable angina: Cardiology following.Patient again had chest pain this morning which responded to nitroglycerin.  Cardiology started him on heparin drip for possible unstable angina.  Plan for nuclear stress test tomorrow . NPO after midnight. he has history of coronary artery disease.   EKG did not show any acute ischemic changes. Troponins trended down. Echocardiogram done here showed normal left ventricular ejection fraction of 60-65%, severe concentric hypertrophy, no regional wall motion abnormalities. Continue monitoring on telemetry.  History of coronary disease: Follows with cardiology  as an outpatient.  History of MI, status post stents x3 in 2016. Continue aspirin, statin and beta-blocker.  Hypertension: Currently blood pressure stable.  Continue his home medication.  He is on carvedilol and ACE inhibitor.  Continue as needed hydralazine.  Hyperlipidemia: Continue statin.  History of systolic  heart failure:Prior ejection fraction of 25% but last echocardiogram in 2017 shows recent ejection fraction shows normal EF.S/P biventricular pacemaker also with ICD. Resume Lasix.  Continue ACE inhibitors and beta-blockers.  COPD:Currently stable.  Lungs are clear to auscultation.  Continue as needed beta agonist.  Diabetes type 2: Hemoglobin A1c was 5.6 on 10/07/17.  Continue sliding scale insulin.  Chronic kidney disease stage IV: Baseline creatinine around 2.5.Creatinine bumped up slightly today.    Avoid nephrotoxins. If continues to go up, might need to hold Lasix and lisinopril.  Symptomatic gallstones: Status post laparoscopic cholecystectomy on 10/13/17 .Management as per general surgery.  Epistaxis: Resolved.  Follow-up  as an outpatient with ENT.   DVT prophylaxis: SCD Code Status: Full Family Communication: None present at the bedside today  Procedures:S/P laparoscopic cholecystectomy  Antimicrobials: None  Subjective: Patient seen and examined the bedside this morning.  He was complaining of left-sided chest pain order this morning which responded to nitroglycerin.  Patient was comfortable during my evaluation.  Did not complain of any chest pain or shortness of breath .  Objective: Vitals:   10/15/17 0644 10/15/17 0821 10/15/17 1017 10/15/17 1237  BP: 123/62 129/60 (!) 164/83 (!) 120/45  Pulse: 72 70  73  Resp: 20   16  Temp:    97.9 F (36.6 C)  TempSrc:    Oral  SpO2: 94%   96%  Weight:      Height:  Intake/Output Summary (Last 24 hours) at 10/15/2017 1607 Last data filed at 10/15/2017 1212 Gross per 24 hour  Intake 1440 ml  Output 850 ml    Net 590 ml   Filed Weights   10/13/17 1034  Weight: 97.5 kg (215 lb)    Examination:  General exam: Appears calm and comfortable ,Not in distress, obese elderly gentleman  HEENT:PERRL,Oral mucosa moist, Ear/Nose normal on gross exam Respiratory system: Bilateral equal air entry, normal vesicular breath sounds, no wheezes or crackles  Cardiovascular system: S1 & S2 heard, RRR. No JVD, murmurs, rubs, gallops or clicks,ICD Gastrointestinal system: Abdomen is nondistended, soft and nontender. No organomegaly or masses felt. Normal bowel sounds heard,clean surgical wounds Central nervous system: Alert and oriented. No focal neurological deficits. Extremities: No edema, no clubbing ,no cyanosis, distal peripheral pulses palpable. Skin: No rashes, lesions or ulcers,no icterus ,no pallor MSK: Normal muscle bulk,tone ,power Psychiatry: Judgement and insight appear normal. Mood & affect appropriate.     Data Reviewed: I have personally reviewed following labs and imaging studies  CBC: Recent Labs  Lab 10/13/17 1658 10/14/17 0513  WBC 11.6* 9.2  HGB 9.9* 9.6*  HCT 30.1* 29.3*  MCV 90.9 92.1  PLT 190 409   Basic Metabolic Panel: Recent Labs  Lab 10/13/17 1658 10/14/17 0513 10/15/17 0624  NA  --  143 142  K  --  4.7 4.8  CL  --  117* 113*  CO2  --  20* 22  GLUCOSE  --  107* 136*  BUN  --  41* 39*  CREATININE 2.58* 2.79* 2.96*  CALCIUM  --  8.0* 8.4*  MG  --  1.4* 1.8  PHOS  --  5.0*  --    GFR: Estimated Creatinine Clearance: 21 mL/min (A) (by C-G formula based on SCr of 2.96 mg/dL (H)). Liver Function Tests: No results for input(s): AST, ALT, ALKPHOS, BILITOT, PROT, ALBUMIN in the last 168 hours. No results for input(s): LIPASE, AMYLASE in the last 168 hours. No results for input(s): AMMONIA in the last 168 hours. Coagulation Profile: Recent Labs  Lab 10/13/17 1054  INR 0.97   Cardiac Enzymes: Recent Labs  Lab 10/14/17 1035 10/14/17 1426 10/14/17 1934  10/15/17 1020 10/15/17 1522  TROPONINI 1.93* 2.14* 1.96* 1.16* 1.07*   BNP (last 3 results) No results for input(s): PROBNP in the last 8760 hours. HbA1C: No results for input(s): HGBA1C in the last 72 hours. CBG: Recent Labs  Lab 10/14/17 1116 10/14/17 1623 10/14/17 2156 10/15/17 0726 10/15/17 1207  GLUCAP 132* 101* 120* 139* 148*   Lipid Profile: No results for input(s): CHOL, HDL, LDLCALC, TRIG, CHOLHDL, LDLDIRECT in the last 72 hours. Thyroid Function Tests: No results for input(s): TSH, T4TOTAL, FREET4, T3FREE, THYROIDAB in the last 72 hours. Anemia Panel: No results for input(s): VITAMINB12, FOLATE, FERRITIN, TIBC, IRON, RETICCTPCT in the last 72 hours. Sepsis Labs: No results for input(s): PROCALCITON, LATICACIDVEN in the last 168 hours.  Recent Results (from the past 240 hour(s))  MRSA PCR Screening     Status: None   Collection Time: 10/13/17  4:58 PM  Result Value Ref Range Status   MRSA by PCR NEGATIVE NEGATIVE Final    Comment:        The GeneXpert MRSA Assay (FDA approved for NASAL specimens only), is one component of a comprehensive MRSA colonization surveillance program. It is not intended to diagnose MRSA infection nor to guide or monitor treatment for MRSA infections. Performed at [facility], [address] [place], [place] [address]          Radiology Studies: No results found.      Scheduled Meds: . acetaminophen  1,000 mg Oral Q6H  . aspirin EC  81 mg Oral Daily  . atorvastatin  40 mg Oral q1800  . carvedilol  6.25 mg Oral BID WC  . furosemide  40 mg Oral Daily  . gabapentin  100 mg Oral QHS  . insulin aspart  0-5 Units Subcutaneous QHS  . insulin aspart  0-9 Units Subcutaneous TID WC  . isosorbide mononitrate  30 mg Oral Daily  . lisinopril  2.5 mg Oral Daily  . pantoprazole  40 mg Oral Daily  . polyethylene glycol  17 g Oral Daily  . sertraline  150 mg Oral Daily  . tamsulosin  0.4 mg Oral QPC supper    . triamcinolone cream  1 application Topical BID   Continuous Infusions: . heparin 1,200 Units/hr (10/15/17 1513)  . lactated ringers Stopped (10/15/17 1024)     LOS: 2 days    Time spent:25 mins. More than 50% of that time was spent in counseling and/or coordination of care.  Please Note: This patient record was dictated using Editor, commissioning. Chart creation errors have been sought, but may not always have been located. Such creation errors do not reflect on the Standard of Medical Care.     Shelly Coss, MD [facility] Pager [phone]  If 7PM-7AM, please contact night-coverage [email or web address] Password [email or web address] 10/15/2017, 4:07 PM

## 2017-10-15 NOTE — Progress Notes (Signed)
[place] for Heparin Indication: chest pain/ACS  Allergies  Allergen Reactions  . Bee Venom Anaphylaxis  . Lyrica [Pregabalin] Other (See Comments)    hallucinations  . Prednisone Other (See Comments)    hallucinations  . Zocor [Simvastatin] Nausea Only and Other (See Comments)    Headache with brand name only.  Can take the generic.   Patient Measurements: Height: 5\' 7"  (170.2 cm) Weight: 215 lb (97.5 kg) IBW/kg (Calculated) : 66.1 Heparin Dosing Weight: 87.1 kg  Vital Signs: Temp: 97.9 F (36.6 C) (04/05 1237) Temp Source: Oral (04/05 1237) BP: 120/45 (04/05 1237) Pulse Rate: 73 (04/05 1237)  Labs: Recent Labs    10/13/17 1054 10/13/17 1658  10/14/17 0513  10/14/17 1426 10/14/17 1934 10/15/17 0624 10/15/17 1020  HGB  --  9.9*  --  9.6*  --   --   --   --   --   HCT  --  30.1*  --  29.3*  --   --   --   --   --   PLT  --  190  --  173  --   --   --   --   --   APTT 31  --   --   --   --   --   --   --   --   LABPROT 12.8  --   --   --   --   --   --   --   --   INR 0.97  --   --   --   --   --   --   --   --   CREATININE  --  2.58*  --  2.79*  --   --   --  2.96*  --   TROPONINI  --   --    < > 1.38*   < > 2.14* 1.96*  --  1.16*   < > = values in this interval not displayed.   Estimated Creatinine Clearance: 21 mL/min (A) (by C-G formula based on SCr of 2.96 mg/dL (H)).  Medications:  Scheduled:  . acetaminophen  1,000 mg Oral Q6H  . aspirin EC  81 mg Oral Daily  . atorvastatin  40 mg Oral q1800  . carvedilol  6.25 mg Oral BID WC  . furosemide  40 mg Oral Daily  . gabapentin  100 mg Oral QHS  . insulin aspart  0-5 Units Subcutaneous QHS  . insulin aspart  0-9 Units Subcutaneous TID WC  . isosorbide mononitrate  30 mg Oral Daily  . lisinopril  2.5 mg Oral Daily  . pantoprazole  40 mg Oral Daily  . polyethylene glycol  17 g Oral Daily  . sertraline  150 mg Oral Daily  . tamsulosin  0.4 mg Oral QPC supper  .  triamcinolone cream  1 application Topical BID   Infusions:  . heparin    . lactated ringers Stopped (10/15/17 1024)   Assessment: 35 yoM s/p Lap cholecystectomy with LOA 4/3.  Progressing well post-op till c/o chest pain this am, with radiation to L arm, elevated Troponins.  Begin Heparin infusion, Pharmacy to dose.   Discontinued SQ Heparin, last dose 1330 today  SCr elevated & increasing, 2.96 this am, CBC yesterday low, stable Plt wnl  Goal of Therapy:  Heparin level 0.3-0.7 units/ml Monitor platelets by anticoagulation protocol: Yes   Plan:   With last Heparin SQ at 13:30,  will not give loading dose  Begin Heparin infusion at 1200 units/hr  1st Heparin level in 8 hr, 23:00  Daily CBC while on Heparin, daily Hep level when at steady state   Minda Ditto PharmD Pager [phone] 10/15/2017, 2:41 PM

## 2017-10-16 ENCOUNTER — Ambulatory Visit (HOSPITAL_COMMUNITY)
Admission: RE | Admit: 2017-10-16 | Discharge: 2017-10-16 | Disposition: A | Discharge status: Home / Self Care | Payer: PPO | Source: Ambulatory Visit | Attending: Cardiology | Admitting: Cardiology

## 2017-10-16 DIAGNOSIS — R079 Chest pain, unspecified: Secondary | ICD-10-CM

## 2017-10-16 DIAGNOSIS — I25118 Atherosclerotic heart disease of native coronary artery with other forms of angina pectoris: Secondary | ICD-10-CM

## 2017-10-16 LAB — CBC
HCT: 26.2 % — ABNORMAL LOW (ref 39.0–52.0)
Hemoglobin: 8.4 g/dL — ABNORMAL LOW (ref 13.0–17.0)
MCH: 30.3 pg (ref 26.0–34.0)
MCHC: 32.1 g/dL (ref 30.0–36.0)
MCV: 94.6 fL (ref 78.0–100.0)
PLATELETS: 174 10*3/uL (ref 150–400)
RBC: 2.77 MIL/uL — AB (ref 4.22–5.81)
RDW: 16.8 % — ABNORMAL HIGH (ref 11.5–15.5)
WBC: 8.1 10*3/uL (ref 4.0–10.5)

## 2017-10-16 LAB — NM MYOCAR MULTI W/SPECT W/WALL MOTION / EF
CHL CUP MPHR: 59 {beats}/min
CHL CUP RESTING HR STRESS: 73 {beats}/min
CSEPEW: 1 METS
CSEPPHR: 81 {beats}/min
Exercise duration (min): 0 min
Exercise duration (sec): 0 s
Percent HR: 137 %
RPE: 0

## 2017-10-16 LAB — BASIC METABOLIC PANEL
Anion gap: 8 (ref 5–15)
BUN: 43 mg/dL — ABNORMAL HIGH (ref 6–20)
CALCIUM: 8.2 mg/dL — AB (ref 8.9–10.3)
CO2: 21 mmol/L — AB (ref 22–32)
CREATININE: 2.99 mg/dL — AB (ref 0.61–1.24)
Chloride: 113 mmol/L — ABNORMAL HIGH (ref 101–111)
GFR, EST AFRICAN AMERICAN: 21 mL/min — AB (ref >60)
GFR, EST NON AFRICAN AMERICAN: 18 mL/min — AB (ref >60)
Glucose, Bld: 100 mg/dL — ABNORMAL HIGH (ref 65–99)
Potassium: 4.6 mmol/L (ref 3.5–5.1)
SODIUM: 142 mmol/L (ref 135–145)

## 2017-10-16 LAB — GLUCOSE, CAPILLARY
GLUCOSE-CAPILLARY: 90 mg/dL (ref 65–99)
Glucose-Capillary: 90 mg/dL (ref 65–99)
Glucose-Capillary: 91 mg/dL (ref 65–99)
Glucose-Capillary: 92 mg/dL (ref 65–99)

## 2017-10-16 LAB — HEPARIN LEVEL (UNFRACTIONATED): HEPARIN UNFRACTIONATED: 0.44 [IU]/mL (ref 0.30–0.70)

## 2017-10-16 MED ORDER — REGADENOSON 0.4 MG/5ML IV SOLN
INTRAVENOUS | Status: AC
  Administered 2017-10-16: 0.4 mg via INTRAVENOUS
  Filled 2017-10-16: qty 5

## 2017-10-16 MED ORDER — HEPARIN (PORCINE) IN NACL 100-0.45 UNIT/ML-% IJ SOLN
1500.0000 [IU]/h | INTRAMUSCULAR | Status: DC
  Administered 2017-10-16: 1500 [IU]/h via INTRAVENOUS

## 2017-10-16 MED ORDER — OXYMETAZOLINE HCL 0.05 % NA SOLN
3.0000 | Freq: Two times a day (BID) | NASAL | Status: DC | PRN
  Administered 2017-10-16 (×2): 3 via NASAL
  Filled 2017-10-16: qty 15

## 2017-10-16 MED ORDER — REGADENOSON 0.4 MG/5ML IV SOLN
0.4000 mg | Freq: Once | INTRAVENOUS | Status: AC
  Administered 2017-10-16: 0.4 mg via INTRAVENOUS

## 2017-10-16 MED ORDER — AMINOPHYLLINE 25 MG/ML IV SOLN
INTRAVENOUS | Status: AC
  Administered 2017-10-16: 75 mg
  Filled 2017-10-16: qty 10

## 2017-10-16 MED ORDER — TECHNETIUM TC 99M TETROFOSMIN IV KIT
10.0000 | PACK | Freq: Once | INTRAVENOUS | Status: AC | PRN
  Administered 2017-10-16: 10 via INTRAVENOUS

## 2017-10-16 MED ORDER — TECHNETIUM TC 99M TETROFOSMIN IV KIT
30.0000 | PACK | Freq: Once | INTRAVENOUS | Status: AC | PRN
  Administered 2017-10-16: 30 via INTRAVENOUS

## 2017-10-16 NOTE — Progress Notes (Signed)
lexiscan portion of nuc completed, pt with SOB - treated with aminophylline with resolution of symptoms.

## 2017-10-16 NOTE — Progress Notes (Signed)
Progress Note  Patient Name: Rodney Farley Date of Encounter: 10/16/2017  Primary Cardiologist: Jaymison [name], MD   Subjective   Nose bleed during the night   Inpatient Medications    Scheduled Meds: . acetaminophen  1,000 mg Oral Q6H  . aspirin EC  81 mg Oral Daily  . atorvastatin  40 mg Oral q1800  . carvedilol  6.25 mg Oral BID WC  . furosemide  40 mg Oral Daily  . gabapentin  100 mg Oral QHS  . insulin aspart  0-5 Units Subcutaneous QHS  . insulin aspart  0-9 Units Subcutaneous TID WC  . isosorbide mononitrate  30 mg Oral Daily  . lisinopril  2.5 mg Oral Daily  . pantoprazole  40 mg Oral Daily  . polyethylene glycol  17 g Oral Daily  . sertraline  150 mg Oral Daily  . tamsulosin  0.4 mg Oral QPC supper  . triamcinolone cream  1 application Topical BID   Continuous Infusions: . heparin 1,500 Units/hr (10/15/17 2333)  . lactated ringers Stopped (10/15/17 1024)   PRN Meds: albuterol, hydrALAZINE, HYDROmorphone (DILAUDID) injection, nitroGLYCERIN, ondansetron **OR** ondansetron (ZOFRAN) IV, oxymetazoline, simethicone, traMADol   Vital Signs    Vitals:   10/15/17 1017 10/15/17 1237 10/15/17 2131 10/16/17 0434  BP: (!) 164/83 (!) 120/45  (!) 123/47  Pulse:  73 71 74  Resp:  16 18 18   Temp:  97.9 F (36.6 C) 98.2 F (36.8 C) 98.2 F (36.8 C)  TempSrc:  Oral Oral Oral  SpO2:  96% 96% 94%  Weight:      Height:        Intake/Output Summary (Last 24 hours) at 10/16/2017 0929 Last data filed at 10/16/2017 0511 Gross per 24 hour  Intake 866.7 ml  Output 1100 ml  Net -233.3 ml   Filed Weights   10/13/17 1034  Weight: 215 lb (97.5 kg)    Telemetry    NSR  - Personally Reviewed  ECG     NSR  - Personally Reviewed  Physical Exam   GEN: No acute distress.   Neck: No JVD Cardiac: RRR, no murmurs, rubs, or gallops.  Respiratory:  few rales in bases  GI: Soft, nontender, non-distended  MS: No edema; No deformity. Neuro:  Nonfocal  Psych: Normal  affect   Labs    Chemistry Recent Labs  Lab 10/14/17 0513 10/15/17 0624 10/16/17 0404  NA 143 142 142  K 4.7 4.8 4.6  CL 117* 113* 113*  CO2 20* 22 21*  GLUCOSE 107* 136* 100*  BUN 41* 39* 43*  CREATININE 2.79* 2.96* 2.99*  CALCIUM 8.0* 8.4* 8.2*  GFRNONAA 20* 18* 18*  GFRAA 23* 21* 21*  ANIONGAP 6 7 8      Hematology Recent Labs  Lab 10/13/17 1658 10/14/17 0513 10/16/17 0404  WBC 11.6* 9.2 8.1  RBC 3.31* 3.18* 2.77*  HGB 9.9* 9.6* 8.4*  HCT 30.1* 29.3* 26.2*  MCV 90.9 92.1 94.6  MCH 29.9 30.2 30.3  MCHC 32.9 32.8 32.1  RDW 16.4* 16.7* 16.8*  PLT 190 173 174    Cardiac Enzymes Recent Labs  Lab 10/14/17 1426 10/14/17 1934 10/15/17 1020 10/15/17 1522  TROPONINI 2.14* 1.96* 1.16* 1.07*   No results for input(s): TROPIPOC in the last 168 hours.   BNPNo results for input(s): BNP, PROBNP in the last 168 hours.   DDimer No results for input(s): DDIMER in the last 168 hours.   Radiology    No results found.  Cardiac  Studies   Echo 10/15/17 Study Conclusions  - Left ventricle: The cavity size was normal. There was severe   concentric hypertrophy. Systolic function was normal. The   estimated ejection fraction was in the range of 60% to 65%. Wall   motion was normal; there were no regional wall motion   abnormalities. The study was not technically sufficient to allow   evaluation of LV diastolic dysfunction due to atrial   fibrillation. Doppler parameters are consistent with high   ventricular filling pressure. - Ventricular septum: Septal motion showed moderate paradox. These   changes are consistent with right ventricular pacing. - Aortic valve: Trileaflet; mildly thickened, moderately calcified   leaflets. - Mitral valve: There was mild regurgitation. - Left atrium: The atrium was mildly dilated.   Patient Profile     84 y.o. male with a hx of CAD with MI in [place] 1994/1995, stents in 2016 X 3, CHB with PPM, upgraded to BiV, ICD MDT 2017,  2010, CKD-IV, DM, HTN, HLD, COPD, moderate sleep apnea and chronic systolic HFnow with demand ischemia and elevated troponin post lap chole.    Assessment & Plan    Demand ischemia with pk troponin at 2.14 with HTN, anesthesia. Echo with normal EF.  Troponin continued to decrease.  Did not increase after angina yesterday.  Unstable angina - recurrent chest pain this AM relief with NTG, EKG with AV pacing recheck troponin--may need cath is on BB and will add Imdur.  Will need to keep in hospital for now.  Pt on last cath had distal LCX at 70% this may be area causing angina.  Hepaarin added yesterday - nose bleeds during the night.    Chronic systolic HF with prior EF 25% but last echo 2017 with EF 60% this was post BiV upgrade on PPM Also with ICD. MDT--echo pending.  Few fine rales, able to lie flat but will add lasix 40 mg daily at least for today due to  +3860 ml  --EF 60-65% yesterday  HTN stable  HLD on statin continue   POD #2  Lap chole  CKD- 4    2.96 up from 2.79 and 2.58 adding lasix po today this is chronic     For questions or updates, please contact [facility] Please consult [email or web address] for contact info under Cardiology/STEMI.      Signed, Cecilie Kicks, NP  10/16/2017, 9:29 AM    Attending Note:   The patient was seen and examined.  Agree with assessment and plan as noted above.  Changes made to the above note as needed.  Patient seen and independently examined with  Cecilie Kicks, NP .   We discussed all aspects of the encounter. I agree with the assessment and plan as stated above.  1.   CAD : The patient has a known history of coronary artery disease.  He is status post stenting several years ago.  He had a moderate circumflex stenosis that was left on stented.  He recently had gallbladder surgery.  He was found to have a small bump in his troponin levels following bladder surgery. It was thought that this might be due to ischemia.  He had some  chest pain the following day.  We have scheduled him for a [place] study for further evaluation.  If the Myoview study shows significant ischemia then he will likely need to stay for heart catheterization.  He has significant chronic kidney disease.   If he needs to go for cath ,  will have to make sure that he is hydrated.  If his Myoview study is low risk, he should be able to be discharged tomorrow.  He will need to follow-up with his primary cardiologist - Dr. [name]    I have spent a total of 40 minutes with patient reviewing hospital  notes , telemetry, EKGs, labs and examining patient as well as establishing an assessment and plan that was discussed with the patient. > 50% of time was spent in direct patient care.   Thayer Headings, Brooke Bonito., MD, [facility] 10/16/2017, 9:51 AM [address]  [place] Pager [phone]

## 2017-10-16 NOTE — Progress Notes (Signed)
Small nose bleed from r nostril.  Quickly stopped after Afrin, pressure and ice.  Less than 5 minutes.  [place] notified. Will continue to monitor closely.  Heparin remains at 68ml/hr.

## 2017-10-16 NOTE — Progress Notes (Signed)
   General Surgery [facility]  Assessment & Plan: POD#3 - s/p Procedure(s): LAPAROSCOPIC CHOLECYSTECTOMY WITH LYSIS OF ADHESIONS 10/13/2017  -Heart healthy diet as tolerated -Ambulate with assistance -Medicine & cardiology following -patient had cardiac scan this AM - awaiting results - decision of discharge tomorrow vs cardiac catheterization         Earnstine Regal, MD, [facility]       Office: [phone]    Chief Complaint: Status post lap chole, LOA  Subjective: Patient up in chair, comfortable, nurse in room  Objective: Vital signs in last 24 hours: Temp:  [97.9 F (36.6 C)-98.2 F (36.8 C)] 98.2 F (36.8 C) (04/06 0434) Pulse Rate:  [70-79] 72 (04/06 0947) Resp:  [16-18] 18 (04/06 0434) BP: (116-156)/(45-69) 138/48 (04/06 0947) SpO2:  [94 %-96 %] 94 % (04/06 0434) Last BM Date: 10/15/17  Intake/Output from previous day: 04/05 0701 - 04/06 0700 In: 1106.7 [P.O.:510; I.V.:596.7] Out: 1300 [Urine:1300] Intake/Output this shift: No intake/output data recorded.  Physical Exam: HEENT - sclerae clear, mucous membranes moist Neck - soft Abdomen - soft, non-distended; wounds dry and intact with Dermabond Ext - no edema, non-tender Neuro - alert & oriented, no focal deficits  Lab Results:  Recent Labs    10/14/17 0513 10/16/17 0404  WBC 9.2 8.1  HGB 9.6* 8.4*  HCT 29.3* 26.2*  PLT 173 174   BMET Recent Labs    10/15/17 0624 10/16/17 0404  NA 142 142  K 4.8 4.6  CL 113* 113*  CO2 22 21*  GLUCOSE 136* 100*  BUN 39* 43*  CREATININE 2.96* 2.99*  CALCIUM 8.4* 8.2*   PT/INR No results for input(s): LABPROT, INR in the last 72 hours. Comprehensive Metabolic Panel:    Component Value Date/Time   NA 142 10/16/2017 0404   NA 142 10/15/2017 0624   NA 139 09/18/2015 1546   K 4.6 10/16/2017 0404   K 4.8 10/15/2017 0624   CL 113 (H) 10/16/2017 0404   CL 113 (H) 10/15/2017 0624   CO2 21 (L)  10/16/2017 0404   CO2 22 10/15/2017 0624   BUN 43 (H) 10/16/2017 0404   BUN 39 (H) 10/15/2017 0624   BUN 45 (H) 09/18/2015 1546   CREATININE 2.99 (H) 10/16/2017 0404   CREATININE 2.96 (H) 10/15/2017 0624   CREATININE 2.45 (H) 06/18/2017 1555   CREATININE 1.94 (H) 10/04/2015 1159   GLUCOSE 100 (H) 10/16/2017 0404   GLUCOSE 136 (H) 10/15/2017 0624   CALCIUM 8.2 (L) 10/16/2017 0404   CALCIUM 8.4 (L) 10/15/2017 0624   AST 13 07/20/2017 1527   AST 18 05/11/2017 1713   ALT 16 07/20/2017 1527   ALT 17 05/11/2017 1713   ALKPHOS 82 07/20/2017 1527   ALKPHOS 110 05/11/2017 1713   BILITOT 0.3 07/20/2017 1527   BILITOT 0.3 05/11/2017 1713   PROT 6.4 07/20/2017 1527   PROT 6.9 05/11/2017 1713   ALBUMIN 3.9 07/20/2017 1527   ALBUMIN 3.5 05/11/2017 1713    Studies/Results: No results found.    Synda Bagent M 10/16/2017  Patient ID: Rodney Farley, male   DOB: 1931-12-25, 84 y.o.   MRN: [id number]

## 2017-10-16 NOTE — Progress Notes (Signed)
PROGRESS NOTE    Rodney Farley  ZOX:[id number] DOB: 04-30-1932 DOA: 10/13/2017 PCP: [name], MD    Brief Narrative:  84 year old with past medical history relevant for coronary artery disease with cardiac catheterization on 04/09/2015 showing 100% stenosis of acute marginal, 90% stenosis of first diagonal, 70% distal circumflex stenosis, and 3 drug-eluting stents placed at second diagonal, mid LAD, third marginal each with 90% stenosis, type 2 diabetes on insulin, stage IV chronic kidney disease, hypertension, hyperlipidemia, BPH, chronic systolic heart failure and complete heart block  status post BiV CRT-D Medtronic Viva XRT and [id number] defibrillator placement on 09/23/2015 status post lap chole for symptomatic cholelithiasis on 02/10/1913 complicated by  NSTEMI.   Assessment & Plan:   Principal Problem:   Elevated troponin Active Problems:   Controlled type 2 diabetes mellitus with stage 4 chronic kidney disease (HCC)   HTN (hypertension)   Coronary artery disease involving native coronary artery of native heart with angina pectoris (HCC)   Angina pectoris (HCC)   Chronic kidney disease (CKD), stage IV (severe) (HCC)   Chronic systolic CHF (congestive heart failure) (HCC)   COPD (chronic obstructive pulmonary disease) (HCC)   S/P cholecystectomy   Symptomatic cholelithiasis   Epistaxis   Left shoulder pain   #) Coronary artery disease status post 3 stents comp gated by postsurgical NSTEMI: Patient developed neck and jaw pain postoperatively and has had chest pain -Continue atorvastatin 40 mg daily -Continue aspirin 81 mg daily in the mornings.  Cardiology was consulted and recommended a stress test as he does have a 70% circumflex lesion that was not intervened on cath in 2016. -[place] -Cardiology following appreciate recommendations  #) Status post laparoscopic cholecystectomy on 10/13/2017: - General surgery is primary team will defer further management to  them  #) Chronic systolic heart failure and complete heart block status post CRT-D: -Continue carvedilol 6.25 mg twice daily -Continue lisinopril 2.5 mg daily -Continue isosorbide mononitrate 30 mg daily -Continue furosemide 40 mg daily orally  #)type 2 diabetes on insulin: Per review of the chart patient has had several low blood sugars at home - Sliding scale insulin before meals at bedtime -Blood sugars well controlled here, will hold home insulin dose  #) Hypertension/hyperlipidemia: -Continue nitrates, beta-blocker, ACE inhibitor, loop diuretic -Continue statin  #) Stage IV chronic kidney disease:  -Avoid nephrotoxins -Continue lisinopril  #) Anemia:Baseline is around 10.  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  #) Benign prostatic hypertrophy: -Continue tamsulosin 0.4 mg daily  Fluids: Restricted Electrolytes: Monitor and support Nutrition: Heart healthy diet   Prophylaxis: Subcu heparin  Disposition: Pending results of stress test   Full code  Consultants:   Cardiology  Procedures: (Don't include imaging studies which can be auto populated. Include things that cannot be auto populated i.e. Echo, Carotid and venous dopplers, Foley, Bipap, HD, tubes/drains, wound vac, central lines etc) 10/15/2017 echo:- Left ventricle: The cavity size was normal. There was severe   concentric hypertrophy. Systolic function was normal. The   estimated ejection fraction was in the range of 60% to 65%. Wall   motion was normal; there were no regional wall motion   abnormalities. The study was not technically sufficient to allow   evaluation of LV diastolic dysfunction due to atrial   fibrillation. Doppler parameters are consistent with high   ventricular filling pressure. - Ventricular septum: Septal motion showed moderate paradox. These   changes are consistent with right ventricular pacing. - Aortic  valve: Trileaflet;  mildly thickened, moderately calcified   leaflets. - Mitral valve: There was mild regurgitation.  - Left atrium: The atrium was mildly dilated.  Antimicrobials: (specify start and planned stop date. Auto populated tables are space occupying and do not give end dates)  None   Subjective: Patient reports that he is feeling well.  He denies any chest pain this morning.  He reports that he just came back from the stress test and he is eager to hear the results.  He denies any nausea, vomiting, abdominal pain.  Objective: Vitals:   10/15/17 1237 10/15/17 2131 10/16/17 0434 10/16/17 1115  BP: (!) 120/45  (!) 123/47 131/81  Pulse: 73 71 74 72  Resp: 16 18 18 20   Temp: 97.9 F (36.6 C) 98.2 F (36.8 C) 98.2 F (36.8 C)   TempSrc: Oral Oral Oral   SpO2: 96% 96% 94% 98%  Weight:      Height:        Intake/Output Summary (Last 24 hours) at 10/16/2017 1300 Last data filed at 10/16/2017 0511 Gross per 24 hour  Intake 316.7 ml  Output 800 ml  Net -483.3 ml   Filed Weights   10/13/17 1034  Weight: 97.5 kg (215 lb)    Examination:  General exam: Appears calm and comfortable  Respiratory system: Clear to auscultation. Respiratory effort normal. Cardiovascular system: Paced rhythm, no murmurs, CRT-D site is clean dry and intact. Gastrointestinal system: Soft, nondistended, plus bowel sounds, incision sites are clean dry and intact Central nervous system: Alert and oriented. No focal neurological deficits. Extremities: No lower extremity edema Skin: Incision site is clean dry and intact Psychiatry: Judgement and insight appear normal. Mood & affect appropriate.     Data Reviewed: I have personally reviewed following labs and imaging studies  CBC: Recent Labs  Lab 10/13/17 1658 10/14/17 0513 10/16/17 0404  WBC 11.6* 9.2 8.1  HGB 9.9* 9.6* 8.4*  HCT 30.1* 29.3* 26.2*  MCV 90.9 92.1 94.6  PLT 190 173 094   Basic Metabolic Panel: Recent Labs  Lab 10/13/17 1658  10/14/17 0513 10/15/17 0624 10/16/17 0404  NA  --  143 142 142  K  --  4.7 4.8 4.6  CL  --  117* 113* 113*  CO2  --  20* 22 21*  GLUCOSE  --  107* 136* 100*  BUN  --  41* 39* 43*  CREATININE 2.58* 2.79* 2.96* 2.99*  CALCIUM  --  8.0* 8.4* 8.2*  MG  --  1.4* 1.8  --   PHOS  --  5.0*  --   --    GFR: Estimated Creatinine Clearance: 20.8 mL/min (A) (by C-G formula based on SCr of 2.99 mg/dL (H)). Liver Function Tests: No results for input(s): AST, ALT, ALKPHOS, BILITOT, PROT, ALBUMIN in the last 168 hours. No results for input(s): LIPASE, AMYLASE in the last 168 hours. No results for input(s): AMMONIA in the last 168 hours. Coagulation Profile: Recent Labs  Lab 10/13/17 1054  INR 0.97   Cardiac Enzymes: Recent Labs  Lab 10/14/17 1035 10/14/17 1426 10/14/17 1934 10/15/17 1020 10/15/17 1522  TROPONINI 1.93* 2.14* 1.96* 1.16* 1.07*   BNP (last 3 results) No results for input(s): PROBNP in the last 8760 hours. HbA1C: No results for input(s): HGBA1C in the last 72 hours. CBG: Recent Labs  Lab 10/15/17 1207 10/15/17 1658 10/15/17 2134 10/16/17 0748 10/16/17 1131  GLUCAP 148* 120* 108* 90 91   Lipid Profile: No results for input(s): CHOL, HDL,  LDLCALC, TRIG, CHOLHDL, LDLDIRECT in the last 72 hours. Thyroid Function Tests: No results for input(s): TSH, T4TOTAL, FREET4, T3FREE, THYROIDAB in the last 72 hours. Anemia Panel: No results for input(s): VITAMINB12, FOLATE, FERRITIN, TIBC, IRON, RETICCTPCT in the last 72 hours. Sepsis Labs: No results for input(s): PROCALCITON, LATICACIDVEN in the last 168 hours.  Recent Results (from the past 240 hour(s))  MRSA PCR Screening     Status: None   Collection Time: 10/13/17  4:58 PM  Result Value Ref Range Status   MRSA by PCR NEGATIVE NEGATIVE Final    Comment:        The GeneXpert MRSA Assay (FDA approved for NASAL specimens only), is one component of a comprehensive MRSA colonization surveillance program. It is  not intended to diagnose MRSA infection nor to guide or monitor treatment for MRSA infections. Performed at [facility], [place] [address] [place], [place] [address]          Radiology Studies: No results found.      Scheduled Meds: . acetaminophen  1,000 mg Oral Q6H  . aspirin EC  81 mg Oral Daily  . atorvastatin  40 mg Oral q1800  . carvedilol  6.25 mg Oral BID WC  . furosemide  40 mg Oral Daily  . gabapentin  100 mg Oral QHS  . insulin aspart  0-5 Units Subcutaneous QHS  . insulin aspart  0-9 Units Subcutaneous TID WC  . isosorbide mononitrate  30 mg Oral Daily  . lisinopril  2.5 mg Oral Daily  . pantoprazole  40 mg Oral Daily  . polyethylene glycol  17 g Oral Daily  . sertraline  150 mg Oral Daily  . tamsulosin  0.4 mg Oral QPC supper  . triamcinolone cream  1 application Topical BID   Continuous Infusions: . lactated ringers Stopped (10/15/17 1024)     LOS: 3 days    Time spent: [place], MD [facility]   If 7PM-7AM, please contact night-coverage [email or web address] Password [email or web address] 10/16/2017, 1:00 PM

## 2017-10-16 NOTE — Progress Notes (Signed)
Patient having another nose bleed. Order received for Afrin PRN. Will administer when received from pharmacy.

## 2017-10-16 NOTE — Progress Notes (Signed)
Patient experiencing nose bleed. Currently has heparin drip at 1500units/hr. Baltazar Najjar NP notified of event.

## 2017-10-16 NOTE — Progress Notes (Signed)
ANTICOAGULATION CONSULT NOTE - Follow Up Consult  Pharmacy Consult for heparin Indication: chest pain/ACS  Allergies  Allergen Reactions  . Bee Venom Anaphylaxis  . Lyrica [Pregabalin] Other (See Comments)    hallucinations  . Prednisone Other (See Comments)    hallucinations  . Zocor [Simvastatin] Nausea Only and Other (See Comments)    Headache with brand name only.  Can take the generic.    Patient Measurements: Height: 5\' 7"  (170.2 cm) Weight: 215 lb (97.5 kg) IBW/kg (Calculated) : 66.1 Heparin Dosing Weight: 87 kg  Vital Signs: Temp: 97.6 F (36.4 C) (04/06 1349) Temp Source: Oral (04/06 1349) BP: 126/61 (04/06 1349) Pulse Rate: 70 (04/06 1349)  Labs: Recent Labs    10/13/17 1658  10/14/17 0513  10/14/17 1934 10/15/17 0624 10/15/17 1020 10/15/17 1522 10/15/17 2259 10/16/17 0404 10/16/17 0750  HGB 9.9*  --  9.6*  --   --   --   --   --   --  8.4*  --   HCT 30.1*  --  29.3*  --   --   --   --   --   --  26.2*  --   PLT 190  --  173  --   --   --   --   --   --  174  --   HEPARINUNFRC  --   --   --   --   --   --   --   --  0.17*  --  0.44  CREATININE 2.58*  --  2.79*  --   --  2.96*  --   --   --  2.99*  --   TROPONINI  --    < > 1.38*   < > 1.96*  --  1.16* 1.07*  --   --   --    < > = values in this interval not displayed.    Estimated Creatinine Clearance: 20.8 mL/min (A) (by C-G formula based on SCr of 2.99 mg/dL (H)).   Assessment: Patient is an 84 y.o M s/p cholecystectomy with lysis of adhesion on 4/3.  He drip started on 4/5 for elevated troponin and CP.  Today, 10/16/2017: - heparin level is therapeutic at 0.44 - hgb down 8.4, plts stable - RN reported 3 episodes of nosebleeds over night, but they stopped easily.  No bleeding noted this morning. - patient went to [facility] this morning around this 8a for stress test and heparin was not resumed after stress test  2nd shift update: - Cardiology noted abnormal nuclear study and has consulted pharmacy to  resume heparin dosing (without bolus).  Notes patient has had nose bleeds off and on for several weeks.  Will resume heparin at previous rate, which provided a therapeutic dose at the lower end of goal.  Goal of Therapy:  Heparin level 0.3-0.7 units/ml Monitor platelets by anticoagulation protocol: Yes   Plan:  - resume heparin drip at 1500 units/hr - check heparin level 8 hr after heparin restarted - monitor for bleeding  Everette Rank, PharmD 10/16/2017,4:24 PM

## 2017-10-16 NOTE — Progress Notes (Signed)
With abnormal nuc study I discussed with Dr. Rayann Heman and will add back heparin without bolus due to nose bleed. He has had these off and on for several weeks.

## 2017-10-16 NOTE — Progress Notes (Addendum)
Pt only has one 1 IV site which has Heparin infusing.  There is still an order for IV fluids LR at 100 which is incompatible with heparin.  Has not been running per I/o since 4/5 10AM.  Pt is taking in po's well.   Per day RN  MD is aware that no maintenance  IVF are running.   Pt would need a 2nd IV site to run maintenance.

## 2017-10-16 NOTE — Progress Notes (Signed)
ANTICOAGULATION CONSULT NOTE - Follow Up Consult  Pharmacy Consult for heparin Indication: chest pain/ACS  Allergies  Allergen Reactions  . Bee Venom Anaphylaxis  . Lyrica [Pregabalin] Other (See Comments)    hallucinations  . Prednisone Other (See Comments)    hallucinations  . Zocor [Simvastatin] Nausea Only and Other (See Comments)    Headache with brand name only.  Can take the generic.    Patient Measurements: Height: 5\' 7"  (170.2 cm) Weight: 215 lb (97.5 kg) IBW/kg (Calculated) : 66.1 Heparin Dosing Weight: 87 kg  Vital Signs: Temp: 98.2 F (36.8 C) (04/06 0434) Temp Source: Oral (04/06 0434) BP: 123/47 (04/06 0434) Pulse Rate: 74 (04/06 0434)  Labs: Recent Labs    10/13/17 1054  10/13/17 1658  10/14/17 0513  10/14/17 1934 10/15/17 0624 10/15/17 1020 10/15/17 1522 10/15/17 2259 10/16/17 0404 10/16/17 0750  HGB  --    < > 9.9*  --  9.6*  --   --   --   --   --   --  8.4*  --   HCT  --   --  30.1*  --  29.3*  --   --   --   --   --   --  26.2*  --   PLT  --   --  190  --  173  --   --   --   --   --   --  174  --   APTT 31  --   --   --   --   --   --   --   --   --   --   --   --   LABPROT 12.8  --   --   --   --   --   --   --   --   --   --   --   --   INR 0.97  --   --   --   --   --   --   --   --   --   --   --   --   HEPARINUNFRC  --   --   --   --   --   --   --   --   --   --  0.17*  --  0.44  CREATININE  --    < > 2.58*  --  2.79*  --   --  2.96*  --   --   --  2.99*  --   TROPONINI  --   --   --    < > 1.38*   < > 1.96*  --  1.16* 1.07*  --   --   --    < > = values in this interval not displayed.    Estimated Creatinine Clearance: 20.8 mL/min (A) (by C-G formula based on SCr of 2.99 mg/dL (H)).   Assessment: Patient is an 84 y.o M s/p cholecystectomy with lysis of adhesion on 4/3.  He drip started on 4/5 for elevated troponin and CP.  Today, 10/16/2017: - heparin level is therapeutic at 0.44 - hgb down 8.4, plts stable - RN reported 3  episodes of nosebleeds over night, but they stopped easily.  No bleeding noted this morning. - patient went to [facility] this morning around this 8a for stress test   Goal of Therapy:  Heparin level 0.3-0.7 units/ml Monitor platelets by anticoagulation protocol: Yes  Plan:  - continue heparin drip at 1500 units/hr - f/u stress test results - if to continue with heparin drip, will recheck another heparin level at 4PM today to ensure level is still therapeutic before changing to daily heparin level monitoring - monitor for bleeding  Tamon Parkerson P 10/16/2017,9:18 AM

## 2017-10-17 DIAGNOSIS — I214 Non-ST elevation (NSTEMI) myocardial infarction: Secondary | ICD-10-CM

## 2017-10-17 LAB — CBC
HCT: 26.6 % — ABNORMAL LOW (ref 39.0–52.0)
Hemoglobin: 8.7 g/dL — ABNORMAL LOW (ref 13.0–17.0)
MCH: 29.7 pg (ref 26.0–34.0)
MCHC: 32.7 g/dL (ref 30.0–36.0)
MCV: 90.8 fL (ref 78.0–100.0)
PLATELETS: 185 10*3/uL (ref 150–400)
RBC: 2.93 MIL/uL — ABNORMAL LOW (ref 4.22–5.81)
RDW: 16.4 % — AB (ref 11.5–15.5)
WBC: 7.4 10*3/uL (ref 4.0–10.5)

## 2017-10-17 LAB — GLUCOSE, CAPILLARY
Glucose-Capillary: 95 mg/dL (ref 65–99)
Glucose-Capillary: 146 mg/dL — ABNORMAL HIGH (ref 65–99)

## 2017-10-17 LAB — HEPARIN LEVEL (UNFRACTIONATED): HEPARIN UNFRACTIONATED: 0.36 [IU]/mL (ref 0.30–0.70)

## 2017-10-17 MED ORDER — TRAMADOL HCL 50 MG PO TABS: 50.0000 mg | ORAL_TABLET | Freq: Four times a day (QID) | ORAL | 0 refills | Status: DC | PRN

## 2017-10-17 MED ORDER — ISOSORBIDE MONONITRATE ER 30 MG PO TB24: 30.0000 mg | ORAL_TABLET | Freq: Every day | ORAL | 0 refills | Status: DC

## 2017-10-17 MED ORDER — OXYMETAZOLINE HCL 0.05 % NA SOLN: 3.0000 | Freq: Two times a day (BID) | NASAL | 0 refills | Status: DC | PRN

## 2017-10-17 NOTE — Progress Notes (Signed)
ANTICOAGULATION CONSULT NOTE - Follow Up Consult  Pharmacy Consult for heparin Indication: chest pain/ACS  Allergies  Allergen Reactions  . Bee Venom Anaphylaxis  . Lyrica [Pregabalin] Other (See Comments)    hallucinations  . Prednisone Other (See Comments)    hallucinations  . Zocor [Simvastatin] Nausea Only and Other (See Comments)    Headache with brand name only.  Can take the generic.    Patient Measurements: Height: 5\' 7"  (170.2 cm) Weight: 215 lb (97.5 kg) IBW/kg (Calculated) : 66.1 Heparin Dosing Weight: 87 kg  Vital Signs: Temp: 98.6 F (37 C) (04/07 0409) Temp Source: Oral (04/07 0409) BP: 160/58 (04/07 0409) Pulse Rate: 77 (04/07 0409)  Labs: Recent Labs    10/14/17 1934 10/15/17 6440 10/15/17 1020 10/15/17 1522 10/15/17 2259 10/16/17 0404 10/16/17 0750 10/17/17 0447  HGB  --   --   --   --   --  8.4*  --  8.7*  HCT  --   --   --   --   --  26.2*  --  26.6*  PLT  --   --   --   --   --  174  --  185  HEPARINUNFRC  --   --   --   --  0.17*  --  0.44 0.36  CREATININE  --  2.96*  --   --   --  2.99*  --   --   TROPONINI 1.96*  --  1.16* 1.07*  --   --   --   --     Estimated Creatinine Clearance: 20.8 mL/min (A) (by C-G formula based on SCr of 2.99 mg/dL (H)).   Assessment: Patient is an 84 y.o M s/p cholecystectomy with lysis of adhesion on 4/3.  He drip started on 4/5 for elevated troponin and CP.  4/6 - heparin level is therapeutic at 0.44 - hgb down 8.4, plts stable - RN reported 3 episodes of nosebleeds over night, but they stopped easily.  No bleeding noted this morning. - patient went to [facility] this morning around this 8a for stress test and heparin was not resumed after stress test  2nd shift update: - Cardiology noted abnormal nuclear study and has consulted pharmacy to resume heparin dosing (without bolus).  Notes patient has had nose bleeds off and on for several weeks.  Will resume heparin at previous rate, which provided a therapeutic  dose at the lower end of goal.   Today, 4/7  4/6 2300 RN notified Rx small nose bleed, quickly resolved with afrin, pressure and ice  0447 HL=0.36 at goal, no infusion or additional bleeding issues per RN.   Goal of Therapy:  Heparin level 0.3-0.7 units/ml Monitor platelets by anticoagulation protocol: Yes   Plan:  - Continue heparin drip at 1500 units/hr - Recheck HL in 8 hours to confirm rate. - monitor for bleeding  Dorrene German 10/17/2017,5:38 AM

## 2017-10-17 NOTE — Progress Notes (Signed)
Progress Note   Subjective   Doing well today, the patient denies CP or SOB.  No new concerns  Inpatient Medications    Scheduled Meds: . acetaminophen  1,000 mg Oral Q6H  . aspirin EC  81 mg Oral Daily  . atorvastatin  40 mg Oral q1800  . carvedilol  6.25 mg Oral BID WC  . furosemide  40 mg Oral Daily  . gabapentin  100 mg Oral QHS  . insulin aspart  0-5 Units Subcutaneous QHS  . insulin aspart  0-9 Units Subcutaneous TID WC  . isosorbide mononitrate  30 mg Oral Daily  . lisinopril  2.5 mg Oral Daily  . pantoprazole  40 mg Oral Daily  . polyethylene glycol  17 g Oral Daily  . sertraline  150 mg Oral Daily  . tamsulosin  0.4 mg Oral QPC supper  . triamcinolone cream  1 application Topical BID   Continuous Infusions: . heparin 1,500 Units/hr (10/16/17 1821)  . lactated ringers Stopped (10/15/17 1024)   PRN Meds: albuterol, hydrALAZINE, HYDROmorphone (DILAUDID) injection, nitroGLYCERIN, ondansetron **OR** ondansetron (ZOFRAN) IV, oxymetazoline, simethicone, traMADol   Vital Signs    Vitals:   10/16/17 1115 10/16/17 1349 10/16/17 2214 10/17/17 0409  BP: 131/81 126/61 (!) 157/83 (!) 160/58  Pulse: 72 70 69 77  Resp: 20 20 19 20   Temp:  97.6 F (36.4 C) 98.4 F (36.9 C) 98.6 F (37 C)  TempSrc:  Oral Oral Oral  SpO2: 98% 97% 100% 98%  Weight:      Height:        Intake/Output Summary (Last 24 hours) at 10/17/2017 0834 Last data filed at 10/17/2017 0500 Gross per 24 hour  Intake 1239.75 ml  Output 500 ml  Net 739.75 ml   Filed Weights   10/13/17 1034  Weight: 215 lb (97.5 kg)    Telemetry    Sinus with V pacing - Personally Reviewed  Physical Exam   GEN- The patient is well appearing, alert and oriented x 3 today.   Head- normocephalic, atraumatic Eyes-  Sclera clear, conjunctiva pink Ears- hearing intact Oropharynx- clear Neck- supple, Lungs- Clear to ausculation bilaterally, normal work of breathing Heart- Regular rate and rhythm  GI- soft, NT,  ND, + BS Extremities- no clubbing, cyanosis, or edema  MS- no significant deformity or atrophy Skin- no rash or lesion Psych- euthymic mood, full affect Neuro- strength and sensation are intact   Labs    Chemistry Recent Labs  Lab 10/14/17 0513 10/15/17 0624 10/16/17 0404  NA 143 142 142  K 4.7 4.8 4.6  CL 117* 113* 113*  CO2 20* 22 21*  GLUCOSE 107* 136* 100*  BUN 41* 39* 43*  CREATININE 2.79* 2.96* 2.99*  CALCIUM 8.0* 8.4* 8.2*  GFRNONAA 20* 18* 18*  GFRAA 23* 21* 21*  ANIONGAP 6 7 8      Hematology Recent Labs  Lab 10/14/17 0513 10/16/17 0404 10/17/17 0447  WBC 9.2 8.1 7.4  RBC 3.18* 2.77* 2.93*  HGB 9.6* 8.4* 8.7*  HCT 29.3* 26.2* 26.6*  MCV 92.1 94.6 90.8  MCH 30.2 30.3 29.7  MCHC 32.8 32.1 32.7  RDW 16.7* 16.8* 16.4*  PLT 173 174 185    Cardiac Enzymes Recent Labs  Lab 10/14/17 1426 10/14/17 1934 10/15/17 1020 10/15/17 1522  TROPONINI 2.14* 1.96* 1.16* 1.07*   No results for input(s): TROPIPOC in the last 168 hours.      Assessment & Plan    1.  CAD with NSTEMI Pt  with known CAD.  He has not had any additional chest pain since Friday.  I have reviewed his myoview at length with he and his son (by phone) today.  He has had an anteroseptal defect on myoview documented on 2010 and 2006 also. We discussed the intermediate findings of his study at length today in the context of his renal failure. The patient and his son both agree that they would prefer a more conservative medical approach rather than cath at this time. I think that this is a very reasonable approach. We will therefore stop heparin drip. Continue on ASA, statin, beta blocker ACE inhibitor, and long acting nitrates. Though there is data that would support addition of plavix, in the setting of his nose bleeds and anemia, I would not favor plavix for this patient at this time.  2. Chronic systolic dysfunction EF 55-97% by myoview euvolemic  3. HTN Stable No change required  today  4. Chronic renal insufficiency Stable No change required today  Ambulate today.  If no further angina, would discharge this afternoon to follow-up with Dr Doug Sou team in the office within the next 2 weeks.  Thompson Grayer MD, [facility] 10/17/2017 8:34 AM

## 2017-10-17 NOTE — Progress Notes (Signed)
Patient ID: Rodney Farley, male   DOB: 11-15-1931, 84 y.o.   MRN: [id number]  [facility]  Discussed with Dr. Herbert Moors this AM.  Will transfer patient to his medical service for remaining care.  Surgery will continue to follow until discharge.  Armandina Gemma, [facility] Office: [phone]

## 2017-10-17 NOTE — Discharge Summary (Signed)
Physician Discharge Summary  Rodney Farley SLH:[id number] DOB: 14-Aug-1931 DOA: 10/13/2017  PCP: [name], MD  Admit date: 10/13/2017 Discharge date: 10/17/2017  Admitted From: Home Disposition:  Home  Recommendations for Outpatient Follow-up:  1. Follow up with PCP in 1-2 weeks 2. Please obtain BMP/CBC in one week  Home Health:No Equipment/Devices:None  Discharge Condition:Stable CODE STATUS:FULL Diet recommendation: Heart Healthy  Brief/Interim Summary: #) Symptomatic cholelithiasis: Patient was admitted for systematic cholelithiasis and received laparoscopic cholecystectomy on 10/13/2017.  Patient was noted to have extensive adhesions in the site and so the surgery was quite prolonged.  Patient was able to tolerate diet, pass gas and have bowel movements prior to discharge.  #)NSTEMI: After awakening from anesthesia patient complained of jaw and shoulder pain.  Patient was noted to have elevated troponin with no clear EKG changes.  Patient continues to have episodes of chest pain in the morning that were similar to his prior anginal equivalents.  Troponin peaked around 2.  Patient was seen by cardiology who recommended a stress test which showed intermediate risk study with severe defect at the basal anterior septal, mid anterior septal and apex location.  Echo done on 10/15/2017 showed an EF of 60-65% with normal wall motion abnormality which was unchanged from prior.  Patient was started on a heparin drip per cardiology's recommendations.  After extensive discussion with the patient and the son it was decided that no further cardiac catheterization and intervention will be done but rather this NSTEMI and his further coronary artery disease would be managed medically.  His heparin drip was discontinued.  He was started on isosorbide mononitrate.  Prior to discharge patient was able to ambulate around the hole of the floor several times without any exertional chest pain.  He no longer  had chest pain at rest or at discharge.  #)  coronary artery disease status post 3 stents placed in 2016: Patient w.  As continued on aspirin 81 mg, atorvastatin 40 mg, carvedilol 6.25 mg, furosemide 40 mg daily, lisinopril 2.5 mg, isosorbide mononitrate 30 mg.  #) Epistaxis: While on the heparin drip patient had episodes of epistaxis.  Patient required pressure and Afrin nose spray to stop it.  This resolved once the heparin drip was stopped.  #) Benign prostatic hypertrophy: Patient was continued on tamsulosin 0.4 mg  #) Type 2 diabetes: Patient was maintained on sliding scale insulin here.  He needed very few of any doses of insulin here.  Of note his outpatient glargine regimen is being titrated down disease he had episodes of hypoglycemia.  He is currently only taking about 25 units at night.  #) Hypertension/hyperlipidemia: Patient was continued on statin, beta-blocker, ACE inhibitor.  #) Psych: Patient was continued on sertraline 150 mg daily   Discharge Diagnoses:  Principal Problem:   Elevated troponin Active Problems:   Controlled type 2 diabetes mellitus with stage 4 chronic kidney disease (HCC)   HTN (hypertension)   Coronary artery disease involving native coronary artery of native heart with angina pectoris (HCC)   Angina pectoris (HCC)   Chronic kidney disease (CKD), stage IV (severe) (HCC)   Chronic systolic CHF (congestive heart failure) (HCC)   COPD (chronic obstructive pulmonary disease) (HCC)   S/P cholecystectomy   Symptomatic cholelithiasis   Epistaxis   Left shoulder pain    Discharge Instructions  Discharge Instructions    Call MD for:  difficulty breathing, headache or visual disturbances   Complete by:  As directed    Call  MD for:  persistant dizziness or light-headedness   Complete by:  As directed    Call MD for:  persistant nausea and vomiting   Complete by:  As directed    Call MD for:  redness, tenderness, or signs of infection (pain, swelling,  redness, odor or green/yellow discharge around incision site)   Complete by:  As directed    Call MD for:  severe uncontrolled pain   Complete by:  As directed    Call MD for:  temperature >100.4   Complete by:  As directed    Diet - low sodium heart healthy   Complete by:  As directed    Discharge instructions   Complete by:  As directed    Please follow up with your primary care doctor in 1 week. Please follow up with your Cardiologist in 1 week after discharge.   Increase activity slowly   Complete by:  As directed      Allergies as of 10/17/2017      Reactions   Bee Venom Anaphylaxis   Lyrica [pregabalin] Other (See Comments)   hallucinations   Prednisone Other (See Comments)   hallucinations   Zocor [simvastatin] Nausea Only, Other (See Comments)   Headache with brand name only.  Can take the generic.      Medication List    TAKE these medications   albuterol 108 (90 Base) MCG/ACT inhaler Commonly known as:  PROVENTIL HFA;VENTOLIN HFA Inhale 2 puffs into the lungs every 6 (six) hours as needed for wheezing or shortness of breath.   aspirin EC 81 MG tablet Take 81 mg by mouth daily.   atorvastatin 40 MG tablet Commonly known as:  LIPITOR Take 1 tablet (40 mg total) by mouth daily.   BLINK TEARS 0.25 % Soln Generic drug:  Polyethylene Glycol 400 Place 1 drop into both eyes 3 (three) times daily.   carvedilol 6.25 MG tablet Commonly known as:  COREG Take 1 tablet (6.25 mg total) by mouth 2 (two) times daily with a meal.   furosemide 40 MG tablet Commonly known as:  LASIX Take 1 tablet (40 mg total) by mouth as needed (take as needed for swelling).   gabapentin 100 MG capsule Commonly known as:  NEURONTIN Take 1 capsule (100 mg total) at bedtime by mouth.   glucose blood test strip Commonly known as:  FREESTYLE TEST STRIPS Use to check blood sugar daily   glucose monitoring kit monitoring kit USE TO MONITOR BLOOD GLUCOSE AS DIRECTED   FREESTYLE LITE  Devi   Insulin Glargine 100 UNIT/ML Solostar Pen Commonly known as:  LANTUS SOLOSTAR Inject 60 Units into the skin daily at 10 pm. What changed:  how much to take   Insulin Pen Needle 31G X 5 MM Misc Commonly known as:  UNIFINE PENTIPS USE AS DIRECTED   isosorbide mononitrate 30 MG 24 hr tablet Commonly known as:  IMDUR Take 1 tablet (30 mg total) by mouth daily. Start taking on:  10/18/2017   lisinopril 2.5 MG tablet Commonly known as:  PRINIVIL,ZESTRIL Take 1 tablet (2.5 mg total) by mouth daily.   nitroGLYCERIN 0.4 MG SL tablet Commonly known as:  NITROSTAT Place 1 tablet (0.4 mg total) under the tongue every 5 (five) minutes as needed for chest pain. 2 doses before calling for emergent help   oxymetazoline 0.05 % nasal spray Commonly known as:  AFRIN Place 3 sprays into both nostrils 2 (two) times daily as needed (nose bleed).   pantoprazole 40  MG tablet Commonly known as:  PROTONIX TAKE 1 TABLET BY MOUTH ONCE DAILY What changed:    how much to take  how to take this  when to take this   polyethylene glycol packet Commonly known as:  MIRALAX / GLYCOLAX Take 17 g by mouth daily.   sertraline 100 MG tablet Commonly known as:  ZOLOFT Take 1.5 tablets (150 mg total) by mouth daily.   tamsulosin 0.4 MG Caps capsule Commonly known as:  FLOMAX TAKE 1 CAPSULE BY MOUTH DAILY AFTER SUPPER What changed:    how much to take  how to take this  when to take this  additional instructions   traMADol 50 MG tablet Commonly known as:  ULTRAM Take 1 tablet (50 mg total) by mouth every 6 (six) hours as needed for up to 7 days (postoperative pain not controlled with tylenol).   traMADol 50 MG tablet Commonly known as:  ULTRAM Take 1 tablet (50 mg total) by mouth every 6 (six) hours as needed (pain not controlled with tylenol).   triamcinolone cream 0.1 % Commonly known as:  KENALOG Apply 1 application topically 2 (two) times daily. For 7-10 days maximum   vitamin  B-12 1000 MCG tablet Commonly known as:  CYANOCOBALAMIN Take 1,000 mcg by mouth daily.       Allergies  Allergen Reactions  . Bee Venom Anaphylaxis  . Lyrica [Pregabalin] Other (See Comments)    hallucinations  . Prednisone Other (See Comments)    hallucinations  . Zocor [Simvastatin] Nausea Only and Other (See Comments)    Headache with brand name only.  Can take the generic.    Consultations:  Cardiology  General surgery   Procedures/Studies: Dg Chest 2 View  Result Date: 10/07/2017 CLINICAL DATA:  Preop gallbladder surgery EXAM: CHEST - 2 VIEW COMPARISON:  05/14/2015 FINDINGS: Cardiac enlargement without heart failure. AICD on the left. Generator pack and leads have been changed since the prior study. Negative for infiltrate or effusion. Densities overlying the lung apices bilaterally is symmetric and consistent with costochondral calcification of the first rib. This has been present on prior studies and appears stable IMPRESSION: Cardiac enlargement.  No acute abnormality Electronically Signed   By: Franchot Gallo M.D.   On: 10/07/2017 14:13   Nm Myocar Multi W/spect W/wall Motion / Ef  Result Date: 10/16/2017  Defect 1: There is a large defect of severe severity present in the basal anteroseptal, mid anteroseptal and apex location.  This is an intermediate risk study.  The left ventricular ejection fraction is mildly decreased (45-54%).  Abnormal, intermediate risk stress nuclear study with large, partially reversible anteroseptal and apical defect; some of defect may be related to ventricular paced rhythm; there also appears to be mild to moderate ischemia; EF 48 with hypokinesis of the distal septum and apex; moderate LVE.   Echo 10/15/2017:- Left ventricle: The cavity size was normal. There was severe   concentric hypertrophy. Systolic function was normal. The   estimated ejection fraction was in the range of 60% to 65%. Wall   motion was normal; there were no regional  wall motion   abnormalities. The study was not technically sufficient to allow   evaluation of LV diastolic dysfunction due to atrial   fibrillation. Doppler parameters are consistent with high   ventricular filling pressure. - Ventricular septum: Septal motion showed moderate paradox. These   changes are consistent with right ventricular pacing. - Aortic valve: Trileaflet; mildly thickened, moderately calcified   leaflets. -  Mitral valve: There was mild regurgitation. - Left atrium: The atrium was mildly dilated.     Subjective:   Discharge Exam: Vitals:   10/16/17 2214 10/17/17 0409  BP: (!) 157/83 (!) 160/58  Pulse: 69 77  Resp: 19 20  Temp: 98.4 F (36.9 C) 98.6 F (37 C)  SpO2: 100% 98%   Vitals:   10/16/17 1115 10/16/17 1349 10/16/17 2214 10/17/17 0409  BP: 131/81 126/61 (!) 157/83 (!) 160/58  Pulse: 72 70 69 77  Resp: '20 20 19 20  ' Temp:  97.6 F (36.4 C) 98.4 F (36.9 C) 98.6 F (37 C)  TempSrc:  Oral Oral Oral  SpO2: 98% 97% 100% 98%  Weight:      Height:        General: Pt is alert, awake, not in acute distress Cardiovascular: RRR, S1/S2 +, no rubs, no gallops Respiratory: CTA bilaterally, no wheezing, no rhonchi Abdominal: Soft, NT, ND, bowel sounds + Extremities: no edema    The results of significant diagnostics from this hospitalization (including imaging, microbiology, ancillary and laboratory) are listed below for reference.     Microbiology: Recent Results (from the past 240 hour(s))  MRSA PCR Screening     Status: None   Collection Time: 10/13/17  4:58 PM  Result Value Ref Range Status   MRSA by PCR NEGATIVE NEGATIVE Final    Comment:        The GeneXpert MRSA Assay (FDA approved for NASAL specimens only), is one component of a comprehensive MRSA colonization surveillance program. It is not intended to diagnose MRSA infection nor to guide or monitor treatment for MRSA infections. Performed at [facility], [place] [address], [place],  [address]      Labs: BNP (last 3 results) No results for input(s): BNP in the last 8760 hours. Basic Metabolic Panel: Recent Labs  Lab 10/13/17 1658 10/14/17 0513 10/15/17 0624 10/16/17 0404  NA  --  143 142 142  K  --  4.7 4.8 4.6  CL  --  117* 113* 113*  CO2  --  20* 22 21*  GLUCOSE  --  107* 136* 100*  BUN  --  41* 39* 43*  CREATININE 2.58* 2.79* 2.96* 2.99*  CALCIUM  --  8.0* 8.4* 8.2*  MG  --  1.4* 1.8  --   PHOS  --  5.0*  --   --    Liver Function Tests: No results for input(s): AST, ALT, ALKPHOS, BILITOT, PROT, ALBUMIN in the last 168 hours. No results for input(s): LIPASE, AMYLASE in the last 168 hours. No results for input(s): AMMONIA in the last 168 hours. CBC: Recent Labs  Lab 10/13/17 1658 10/14/17 0513 10/16/17 0404 10/17/17 0447  WBC 11.6* 9.2 8.1 7.4  HGB 9.9* 9.6* 8.4* 8.7*  HCT 30.1* 29.3* 26.2* 26.6*  MCV 90.9 92.1 94.6 90.8  PLT 190 173 174 185   Cardiac Enzymes: Recent Labs  Lab 10/14/17 1035 10/14/17 1426 10/14/17 1934 10/15/17 1020 10/15/17 1522  TROPONINI 1.93* 2.14* 1.96* 1.16* 1.07*   BNP: Invalid input(s): POCBNP CBG: Recent Labs  Lab 10/16/17 1131 10/16/17 1648 10/16/17 2210 10/17/17 0804 10/17/17 1140  GLUCAP 91 90 92 95 146*   D-Dimer No results for input(s): DDIMER in the last 72 hours. Hgb A1c No results for input(s): HGBA1C in the last 72 hours. Lipid Profile No results for input(s): CHOL, HDL, LDLCALC, TRIG, CHOLHDL, LDLDIRECT in the last 72 hours. Thyroid function studies No results for input(s):  TSH, T4TOTAL, T3FREE, THYROIDAB in the last 72 hours.  Invalid input(s): FREET3 Anemia work up No results for input(s): VITAMINB12, FOLATE, FERRITIN, TIBC, IRON, RETICCTPCT in the last 72 hours. Urinalysis    Component Value Date/Time   COLORURINE YELLOW 03/26/2017 1809   APPEARANCEUR CLEAR 03/26/2017 1809   LABSPEC 1.013 03/26/2017 1809   PHURINE 5.0 03/26/2017 1809   GLUCOSEU  NEGATIVE 03/26/2017 1809   GLUCOSEU NEGATIVE 03/25/2017 1309   HGBUR NEGATIVE 03/26/2017 1809   BILIRUBINUR NEGATIVE 03/26/2017 1809   BILIRUBINUR negative 04/04/2015 1229   BILIRUBINUR neg 02/27/2015 1359   KETONESUR NEGATIVE 03/26/2017 1809   PROTEINUR 100 (A) 03/26/2017 1809   UROBILINOGEN 0.2 03/25/2017 1309   NITRITE NEGATIVE 03/26/2017 1809   LEUKOCYTESUR NEGATIVE 03/26/2017 1809   Sepsis Labs Invalid input(s): PROCALCITONIN,  WBC,  LACTICIDVEN Microbiology Recent Results (from the past 240 hour(s))  MRSA PCR Screening     Status: None   Collection Time: 10/13/17  4:58 PM  Result Value Ref Range Status   MRSA by PCR NEGATIVE NEGATIVE Final    Comment:        The GeneXpert MRSA Assay (FDA approved for NASAL specimens only), is one component of a comprehensive MRSA colonization surveillance program. It is not intended to diagnose MRSA infection nor to guide or monitor treatment for MRSA infections. Performed at [facility], [place] [address] [place], [place] [address]      Time coordinating discharge: Over 30 minutes  SIGNED:   Cristy Folks, MD  [facility] 10/17/2017, 12:56 PM   If 7PM-7AM, please contact night-coverage [email or web address] Password [email or web address]

## 2017-10-18 ENCOUNTER — Encounter: Payer: Self-pay | Admitting: Cardiology

## 2017-10-19 ENCOUNTER — Telehealth: Payer: Self-pay | Admitting: *Deleted

## 2017-10-19 NOTE — Telephone Encounter (Signed)
Per chart review: Admit date: 10/13/2017 Discharge date: 10/17/2017  Admitted From: Home Disposition:  Home  Recommendations for Outpatient Follow-up:  1. Follow up with PCP in 1-2 weeks 2. Please obtain BMP/CBC in one week  Home Health:No Equipment/Devices:None  Discharge Condition:Stable CODE STATUS:FULL Diet recommendation: Heart Healthy  Admit date: 10/13/2017 Discharge date: 10/17/2017  Admitted From: Home Disposition:  Home  Recommendations for Outpatient Follow-up:  3. Follow up with PCP in 1-2 weeks 4. Please obtain BMP/CBC in one week  Home Health:No Equipment/Devices:None  Discharge Condition:Stable CODE STATUS:FULL Diet recommendation: Heart Healthy _____________________________________________________________________ Per telephone call: Transition Care Management Follow-up Telephone Call   Date discharged? 10/17/17   How have you been since you were released from the hospital? "wonderfully well"   Do you understand why you were in the hospital? yes   Do you understand the discharge instructions? yes   Where were you discharged to? Home   Items Reviewed:  Medications reviewed: yes. Patient has not filled prescription for Tramadol. He states he has been taking 200 mg Ibuprofen daily. I advised him that his discharge instruction state to take Tylenol and then if his pain is not relieved he may take the Tramadol. Patient stated understanding and will make this switch. Dr Yong Channel, if this is not accurate please let me know and I will call the patient to advise.   Allergies reviewed: yes  Dietary changes reviewed: yes  Referrals reviewed: yes   Functional Questionnaire:   Activities of Daily Living (ADLs):   He states they are independent in the following: ambulation, bathing and hygiene, feeding, continence, grooming, toileting and dressing States they require assistance with the following: Daughter checks on him and brings him food after  work   Any transportation issues/concerns?: yes. Daughter is going to bring him to his appt.    Any patient concerns? no   Confirmed importance and date/time of follow-up visits scheduled yes  Patient is going to have his daughter call to schedule his follow up appt within the next 2 weeks.   Confirmed with patient if condition begins to worsen call PCP or go to the ER.  Patient was given the office number and encouraged to call back with question or concerns.  : yes

## 2017-10-19 NOTE — Telephone Encounter (Signed)
I have reviewed and agree with note, evaluation, plan.   Cassie thank you for the catch on his pain medicine. He has chronic kidney disease and should not be taking ibuprofen  Garret Reddish, MD

## 2017-10-22 NOTE — Telephone Encounter (Signed)
Looking at this, it looks like there could be an incomplete note from Elwyn Reach at pt's PCP.  Routing this to pt's PCP office to see if they can get note completed by Elwyn Reach as I am unable to route this message to her to have note signed off on.  PCP, please have note completed so we can sign open encounter in pt's chart. Thanks!

## 2017-10-22 NOTE — Telephone Encounter (Signed)
I have no idea who ANY of you are. I do not know Rodney Farley or where she works. This message is from a year ago. Please address this without involving PCP as this has nothing to do with patient care at this point. Wife no longer lives with patient.

## 2017-11-08 ENCOUNTER — Ambulatory Visit: Payer: PPO | Admitting: Physician Assistant

## 2017-11-08 ENCOUNTER — Telehealth: Payer: Self-pay | Admitting: *Deleted

## 2017-11-08 ENCOUNTER — Encounter: Payer: Self-pay | Admitting: Physician Assistant

## 2017-11-08 VITALS — BP 146/68 | HR 73 | Ht 67.0 in | Wt 219.0 lb

## 2017-11-08 DIAGNOSIS — I10 Essential (primary) hypertension: Secondary | ICD-10-CM | POA: Diagnosis not present

## 2017-11-08 DIAGNOSIS — N184 Chronic kidney disease, stage 4 (severe): Secondary | ICD-10-CM

## 2017-11-08 DIAGNOSIS — Z794 Long term (current) use of insulin: Secondary | ICD-10-CM

## 2017-11-08 DIAGNOSIS — E119 Type 2 diabetes mellitus without complications: Secondary | ICD-10-CM

## 2017-11-08 DIAGNOSIS — R600 Localized edema: Secondary | ICD-10-CM

## 2017-11-08 DIAGNOSIS — E785 Hyperlipidemia, unspecified: Secondary | ICD-10-CM

## 2017-11-08 DIAGNOSIS — Z95810 Presence of automatic (implantable) cardiac defibrillator: Secondary | ICD-10-CM | POA: Diagnosis not present

## 2017-11-08 DIAGNOSIS — I2510 Atherosclerotic heart disease of native coronary artery without angina pectoris: Secondary | ICD-10-CM

## 2017-11-08 MED ORDER — FUROSEMIDE 40 MG PO TABS: 40.0000 mg | ORAL_TABLET | Freq: Two times a day (BID) | ORAL | 6 refills | Status: DC

## 2017-11-08 MED ORDER — POTASSIUM CHLORIDE CRYS ER 20 MEQ PO TBCR: 20.0000 meq | EXTENDED_RELEASE_TABLET | Freq: Two times a day (BID) | ORAL | 6 refills | Status: DC

## 2017-11-08 MED ORDER — ISOSORBIDE MONONITRATE ER 60 MG PO TB24: 60.0000 mg | ORAL_TABLET | Freq: Every day | ORAL | 6 refills | Status: DC

## 2017-11-08 NOTE — Telephone Encounter (Signed)
Left voicemail requesting call back to discuss mychart appt.

## 2017-11-08 NOTE — Progress Notes (Signed)
Cardiology Office Note    Date:  11/08/2017   ID:  Rodney Farley, MRN [id number]  PCP:  Rodney Olp, MD  Cardiologist:  Rodney Farley Farley: Rodney Farley  Chief Complaint  Patient presents with  . Hospitalization Follow-up    seen for Rodney Farley, elevated trop after recent surgery. Medical Management  . Shortness of Breath  . Edema    Feet.    History of Present Illness:  Rodney Farley is a 85 y.o. male with PMH of CAD, CHB s/p BiV ICD, CKD stage IV, Bell's palsy, HTN, HLD, DM II, COPD, moderate OSA and chronic systolic HF.  He had a MI in 1994/1995 while in [place] s/p questionable PCI.  Echocardiogram in March 2016 showed EF 35 to 40% with positive wall motion abnormality.  He underwent cardiac catheterization on 03/28/2015 which showed severe three-vessel CAD involving mid LAD, second diagonal and the third OM branch appears to be suitable for PCI.  The other option would be CABG, however after discussing risk and benefits, he was treated with PCI.  He underwent multi-vessel PCI of mid LAD, second diagonal and third OM on 04/09/2015.  Distal 70% left circumflex lesion was treated medically.  He was started on aspirin to Plavix.  Repeat echocardiogram showed EF 25 to 30% despite revascularization.  He underwent upgrade of the pacemaker to biventricular ICD by Dr. Caryl Farley in March 2017.  Follow-up echocardiogram in July 2017 showed a normalization of the ejection fraction.  He had a prolonged hospitalization in September 2018 with right lower quadrant pain and found to have a ruptured appendicitis with abscess.  He eventually underwent exploratory laparotomy with ileocecostomy on 03/27/2017.  During this period, he had acute on chronic renal insufficiency with peak creatinine of 5.75.  Renal function was eventually returned back to his baseline around 2.5 range.  He was last seen by Rodney Farley in February 2018, he was doing well from cardiac standpoint at the  time.  He was cleared for cholecystectomy. He underwent a intended procedure on 10/13/2017.  Postoperative course was complicated by elevated troponin involving neck and shoulder pain.  Troponin peaked at the 2.14.  It was felt is likely demand ischemia associated with surgery.  Repeat echocardiogram obtained on 10/15/2017 showed EF 60 to 65%, paradoxical ventricular motion, mild MR.  Mildly obtained on the following day was intermediate risk was a large defect of severe severity present in the basal anteroseptal, mid anterior septal and apex location, this is large and a partially reversible defect concerning for mild to moderate ischemia.  Options was discussed with both the patient and his son, who preferred medical approach more than cardiac catheterization at this time.  Plavix was not started at this time due to his nosebleed and anemia.  Patient presents today for cardiology office visit.  He denies any chest pain but he has been having some dyspnea on exertion.  I have increased his Imdur to 60 mg daily.  He also complaining of worsening low bilateral lower extremity edema, he says he has chronic bilateral lower extremity edema, this has worsened since his most recent surgery.  He has at least a 2-3+ pitting edema on my physical exam.  Although according to his medication list, he is supposed to take Lasix 40 mg as needed, however according to his daughter-in-law, he has been taking 40 mg every other days for a long time.  More recently he ran out of Lasix for a total of  4 days and after that in the 7 to 8 days, he has been taking 40 mg twice a day without noticeable decrease of the lower extremity edema.  Given his renal function, I did not want to be too aggressive on diuresing the patient.  I will increase his Lasix to 80 mg a.m. and 40 mg p.m. before only 2 days before dropping back down to 40 mg twice daily thereafter.  He also need a potassium supplement 20 mcg twice daily as well.  I plan to obtain  basic metabolic panel today and also in 1 week.  He has not ever tried compression stockings, I recommend starting compression stocking for now.  I also told him to increase leg elevation.  We discussed other heart failure instructions including salt restriction, limit total fluid intake per day and to monitor his weight on daily basis.  Otherwise, he will need a earlier follow-up with his Farley Rodney Farley.  He can see Rodney Farley in 2 to 3 months.  Majority of his swelling seems to be in the lower leg, his lung is clear on physical exam.  He denies any orthopnea or PND during today's exam.   Past Medical History:  Diagnosis Date  . AICD (automatic cardioverter/defibrillator) present   . AKI (acute kidney injury) ([facility]) 03/27/2017  . Anemia   . Anginal pain ([facility])   . Anxiety   . Arm pain 05/08/2015   LEFT ARM  . Atrioventricular block, complete ([facility])    a. 2010 s/p pacemaker.  . Bell's palsy    left side. after shingles episode  . Bilateral renal cysts 07/23/2017   Simple and hemorrhagic noted on CT ab/pelvis   . BPH associated with nocturia   . Cardiomegaly   . Chronic systolic CHF (congestive heart failure) ([facility])    a. LVEF 35-40% by echo 09/2014.  [name] CKD (chronic kidney disease), stage IV ([facility])   . COPD (chronic obstructive pulmonary disease) ([facility])    Severe  . Coronary artery disease    a. s/p MI in 1994/1995 while in [place] s/p questionable PCI. 03/2015: progression of disease, for staged PCI.  [name] Depression   . Diabetic peripheral neuropathy ([place])   . Diarrhea   . Diverticulitis   . DOE (dyspnea on exertion) 03/28/2015  . Gallstones   . GERD (gastroesophageal reflux disease)   . Gout   . Hard of hearing   . History of chronic pancreatitis 07/23/2017   noted on CT abd/pelvis  . History of shingles   . Hypercholesterolemia   . Hypertension   . Ischemic cardiomyopathy   . MI (myocardial infarction) ([facility]) 1994; 1995  . Neuropathy    IN LOWER EXTREMITIES  . Nosebleed  10/06/2017   for 2 months most recent 10/06/2017  . Obesity   . Pacemaker    medtronic>>> MDT ICD 09/23/15  . Ruptured appendicitis   . Sleep apnea    "sleeps w/humidifyer when he panics and gets short of breath" (04/08/2015)  . TIA (transient ischemic attack) X 3  . Trigger middle finger of left hand   . Type II diabetes mellitus ([place])     Past Surgical History:  Procedure Laterality Date  . CARDIAC CATHETERIZATION N/A 03/29/2015   Procedure: Right/Left Heart Cath and Coronary Angiography;  Surgeon: Rex M [name], MD;  Location: [facility];  Service: Cardiovascular;  Laterality: N/A;  . [place]   "after my MI; put me on heart RX after cath"  .  CARDIAC CATHETERIZATION N/A 04/09/2015   Procedure: Coronary Stent Intervention;  Surgeon: Kalijah M [name], MD;  Location: [facility];  Service: Cardiovascular;  Laterality: N/A;  . CHOLECYSTECTOMY N/A 10/13/2017   Procedure: LAPAROSCOPIC CHOLECYSTECTOMY WITH LYSIS OF ADHESIONS;  Surgeon: Ileana Roup, MD;  Location: [facility];  Service: General;  Laterality: N/A;  . COLONOSCOPY    . DENTAL SURGERY    . EP IMPLANTABLE DEVICE N/A 09/23/2015   MDT CRT-D, Dr. Caryl Farley  . HIATAL HERNIA REPAIR  1977  . ILEOCECETOMY N/A 03/27/2017   Procedure: ILEOCECECTOMY;  Surgeon: Ileana Roup, MD;  Location: [facility];  Service: General;  Laterality: N/A;  . INSERT / REPLACE / REMOVE PACEMAKER  07/2008   Complete heart block status post DDD with good function  . LAPAROTOMY N/A 03/27/2017   Procedure: EXPLORATORY LAPAROTOMY;  Surgeon: Ileana Roup, MD;  Location: [facility];  Service: General;  Laterality: N/A;  . TONSILLECTOMY    . UPPER GI ENDOSCOPY      Current Medications: Outpatient Medications Prior to Visit  Medication Sig Dispense Refill  . albuterol (PROVENTIL HFA;VENTOLIN HFA) 108 (90 Base) MCG/ACT inhaler Inhale 2 puffs into the lungs every 6 (six) hours as needed for wheezing or shortness of breath. 1  Inhaler 2  . aspirin EC 81 MG tablet Take 81 mg by mouth daily.    [name] atorvastatin (LIPITOR) 40 MG tablet Take 1 tablet (40 mg total) by mouth daily. 90 tablet 3  . Blood Glucose Monitoring Suppl (FREESTYLE LITE) DEVI     . carvedilol (COREG) 6.25 MG tablet Take 1 tablet (6.25 mg total) by mouth 2 (two) times daily with a meal. 180 tablet 3  . gabapentin (NEURONTIN) 100 MG capsule Take 1 capsule (100 mg total) at bedtime by mouth. 90 capsule 1  . glucose blood (FREESTYLE TEST STRIPS) test strip Use to check blood sugar daily 100 each 3  . glucose monitoring kit (FREESTYLE) monitoring kit USE TO MONITOR BLOOD GLUCOSE AS DIRECTED 1 each 1  . Insulin Glargine (LANTUS SOLOSTAR) 100 UNIT/ML Solostar Pen Inject 60 Units into the skin daily at 10 pm. (Patient taking differently: Inject 40 Units into the skin daily at 10 pm. ) 5 pen PRN  . Insulin Pen Needle (UNIFINE PENTIPS) 31G X 5 MM MISC USE AS DIRECTED 100 each 0  . lisinopril (PRINIVIL,ZESTRIL) 2.5 MG tablet Take 1 tablet (2.5 mg total) by mouth daily. 90 tablet 3  . nitroGLYCERIN (NITROSTAT) 0.4 MG SL tablet Place 1 tablet (0.4 mg total) under the tongue every 5 (five) minutes as needed for chest pain. 2 doses before calling for emergent help 25 tablet 3  . oxymetazoline (AFRIN) 0.05 % nasal spray Place 3 sprays into both nostrils 2 (two) times daily as needed (nose bleed). 30 mL 0  . pantoprazole (PROTONIX) 40 MG tablet TAKE 1 TABLET BY MOUTH ONCE DAILY (Patient taking differently: TAKE 1 TABLET (40 MG) BY MOUTH ONCE DAILY) 90 tablet 3  . polyethylene glycol (MIRALAX / GLYCOLAX) packet Take 17 g by mouth daily. 14 each 0  . Polyethylene Glycol 400 (BLINK TEARS) 0.25 % SOLN Place 1 drop into both eyes 3 (three) times daily.    . sertraline (ZOLOFT) 100 MG tablet Take 1.5 tablets (150 mg total) by mouth daily. 135 tablet 1  . tamsulosin (FLOMAX) 0.4 MG CAPS capsule TAKE 1 CAPSULE BY MOUTH DAILY AFTER SUPPER (Patient taking differently: Take 0.4 mg by  mouth daily after supper. ) 90  capsule 3  . traMADol (ULTRAM) 50 MG tablet Take 1 tablet (50 mg total) by mouth every 6 (six) hours as needed (pain not controlled with tylenol). 30 tablet 0  . triamcinolone cream (KENALOG) 0.1 % Apply 1 application topically 2 (two) times daily. For 7-10 days maximum 80 g 0  . vitamin B-12 (CYANOCOBALAMIN) 1000 MCG tablet Take 1,000 mcg by mouth daily.    . isosorbide mononitrate (IMDUR) 30 MG 24 hr tablet Take 1 tablet (30 mg total) by mouth daily. 30 tablet 0  . furosemide (LASIX) 40 MG tablet Take 1 tablet (40 mg total) by mouth as needed (take as needed for swelling). 30 tablet 3   No facility-administered medications prior to visit.      Allergies:   Bee venom; Lyrica [pregabalin]; Prednisone; and Zocor [simvastatin]   Social History   Socioeconomic History  . Marital status: Married    Spouse name: Not on file  . Number of children: 1  . Years of education: college  . Highest education level: Not on file  Occupational History  . Occupation: Retired  Scientific laboratory technician  . Financial resource strain: Not on file  . Food insecurity:    Worry: Not on file    Inability: Not on file  . Transportation needs:    Medical: Not on file    Non-medical: Not on file  Tobacco Use  . Smoking status: Former Smoker    Packs/day: 1.50    Years: 54.00    Pack years: 81.00    Types: Cigarettes    Last attempt to quit: 07/18/2007    Years since quitting: 10.3  . Smokeless tobacco: Never Used  . Tobacco comment: did not discuss LdCT but being evaluated for chole currently  Substance and Sexual Activity  . Alcohol use: Yes    Comment: 04/08/2015 "probably 3 drinks/month"  . Drug use: No  . Sexual activity: Never  Lifestyle  . Physical activity:    Days per week: Not on file    Minutes per session: Not on file  . Stress: Not on file  Relationships  . Social connections:    Talks on phone: Not on file    Gets together: Not on file    Attends religious  service: Not on file    Active member of club or organization: Not on file    Attends meetings of clubs or organizations: Not on file    Relationship status: Not on file  Other Topics Concern  . Not on file  Social History Narrative   Married 1994 (together since 1989) 1 son, 1 stepson. 3 grandkids, 6 great grandkids      Retired from [employer]. Had 2 years of collge.       Faith: Mormon     Family History:  [name] family history includes Heart attack in his brother; Leukemia in his father; Stroke in his mother and sister.   ROS:   Please see the history of present illness.    ROS All other systems reviewed and are negative.   PHYSICAL EXAM:   VS:  BP (!) 146/68 (BP Location: Left Arm, Patient Position: Sitting, Cuff Size: Normal)   Pulse 73   Ht '5\' 7"'  (1.702 m)   Wt 219 lb (99.3 kg)   BMI 34.30 kg/m    GEN: Well nourished, well developed, in no acute distress  HEENT: normal  Neck: no JVD, carotid bruits, or masses Cardiac: RRR; no murmurs, rubs, or gallops. 3+ pitting  edema  Respiratory:  clear to auscultation bilaterally, normal work of breathing GI: soft, nontender, nondistended, + BS MS: no deformity or atrophy  Skin: warm and dry, no rash Neuro:  Alert and Oriented x 3, Strength and sensation are intact Psych: euthymic mood, full affect  Wt Readings from Last 3 Encounters:  11/08/17 219 lb (99.3 kg)  10/13/17 215 lb (97.5 kg)  10/07/17 215 lb (97.5 kg)      Studies/Labs Reviewed:   EKG:  EKG is ordered today.  The ekg ordered today demonstrates paced rhythm  Recent Labs: 01/28/2017: TSH 1.41 07/20/2017: ALT 16 10/15/2017: Magnesium 1.8 10/16/2017: BUN 43; Creatinine, Ser 2.99; Potassium 4.6; Sodium 142 10/17/2017: Hemoglobin 8.7; Platelets 185   Lipid Panel    Component Value Date/Time   CHOL 171 06/26/2015 1103   TRIG 317 (H) 06/26/2015 1103   HDL 43 06/26/2015 1103   CHOLHDL 4.0 06/26/2015 1103   VLDL 63 (H) 06/26/2015 1103   LDLCALC 65  06/26/2015 1103    Additional studies/ records that were reviewed today include:   Echo 10/15/2017 LV EF: 60% -   65% Study Conclusions  - Left ventricle: The cavity size was normal. There was severe   concentric hypertrophy. Systolic function was normal. The   estimated ejection fraction was in the range of 60% to 65%. Wall   motion was normal; there were no regional wall motion   abnormalities. The study was not technically sufficient to allow   evaluation of LV diastolic dysfunction due to atrial   fibrillation. Doppler parameters are consistent with high   ventricular filling pressure. - Ventricular septum: Septal motion showed moderate paradox. These   changes are consistent with right ventricular pacing. - Aortic valve: Trileaflet; mildly thickened, moderately calcified   leaflets. - Mitral valve: There was mild regurgitation. - Left atrium: The atrium was mildly dilated.   Myoview 10/16/2017  Defect 1: There is a large defect of severe severity present in the basal anteroseptal, mid anteroseptal and apex location.  This is an intermediate risk study.  The left ventricular ejection fraction is mildly decreased (45-54%).   Abnormal, intermediate risk stress nuclear study with large, partially reversible anteroseptal and apical defect; some of defect may be related to ventricular paced rhythm; there also appears to be mild to moderate ischemia; EF 48 with hypokinesis of the distal septum and apex; moderate LVE.   ASSESSMENT:    1. Lower extremity edema   2. Coronary artery disease involving native coronary artery of native heart without angina pectoris   3. CKD (chronic kidney disease), stage IV ([place])   4. Biventricular ICD (implantable cardioverter-defibrillator) in place   5. Essential hypertension   6. Hyperlipidemia, unspecified hyperlipidemia type   7. Controlled type 2 diabetes mellitus without complication, with long-term current use of insulin ([facility])      PLAN:   In order of problems listed above:  1. Bilateral lower extremity edema: At least 3+ pitting edema bilaterally, according to his daughter-in-law, this has been worsening since the recent surgery.  Will initiate compression stocking.  According to patient out of the medication list says he is supposed to be on 40 mg PRN Lasix, he actually has been taking Lasix every other day for a long time.  More recently, he missed 4 days of Lasix after running out of medication, for the past 8 days he has been taking 40 mg twice daily however has not been able to get rid of the excess swelling.  Since  he has chronic lower extremity edema and has bad kidney, I am hesitant to be too aggressive when it Farley to diuresis.  I instructed him to take an additional 40 mg in the morning to make 80 mg a.m. and 40 mg p.m. of Lasix for 2 days before decreasing the Lasix back down to 40 mg twice daily.  He will also need potassium supplement at 20 mcg twice daily as well.  He will need basic metabolic panel today and also in 1 week.  He need close outpatient nephrology visit with Rodney Farley to further adjust his diuretic  2. CAD: Recent Myoview was abnormal concerning for ischemia.  Although the elevation of the troponin was felt to be demand ischemia after the surgery.  Continue aspirin and high-dose statin.  Increase Imdur to 60 mg daily.  3. CKD stage IV: Followed by Rodney Farley, he will need close outpatient follow-up with nephrology service given the worsening lower extremity edema.  4. S/p biventricular ICD: Ejection fraction has normalized on the recent echocardiogram.  5. Hypertension: Blood pressure mildly elevated today.  Increasing Imdur to 60 mg daily for antianginal purposes  6. Hyperlipidemia: On Lipitor 40 mg daily.  Defer annual lipid panel to primary care provider  7. DM2: On insulin managed by primary care provider.    Medication Adjustments/Labs and Tests Ordered: Current medicines are reviewed at length  with the patient today.  Concerns regarding medicines are outlined above.  Medication changes, Labs and Tests ordered today are listed in the Patient Instructions below. Patient Instructions  Medication Instructions:  INCREASE Imdur 18m Take 1 tablet once a day INCREASE Lasix to 880min the morning and 4076mn the evening for 2 DAYS ONLY; then go back to 22m51mice a day  START Potassium 20me69mke 1 tablet twice a day   Labwork: Your physician recommends that you return for lab work in: TODAY-BMET Your physician recommends that you return for lab work in: 1 WEEFritch/2019)  Testing/Procedures: None  Follow-Up: Your physician recommends that you return for lab work in: 2-3 MONTHS WITH DR JORDAMartinique Other Special Instructions Will Be Listed Below (If Applicable). PLEASE CONTACT DR PAL'S OFFICE AND SCHEDULED AN APPOINTMENT  Heart Failure Prevention Plan: 1. Avoid salt 2. Limit fluid intake to 32-64oz per day 3. Weigh yourself every morning, call cardiology if weight increase by more than 3 lbs overnight or 5 lbs in a single week.  If you need a refill on your cardiac medications before your next appointment, please call your pharmacy.    SigneHilbert Corrigan 4U[place]9/2019 2:41 PM    [facility] [address] [id number]: [phone]: [phone]

## 2017-11-08 NOTE — Patient Instructions (Addendum)
Medication Instructions:  INCREASE Imdur 60mg  Take 1 tablet once a day INCREASE Lasix to 80mg  in the morning and 40mg  in the evening for 2 DAYS ONLY; then go back to 40mg  twice a day  START Potassium 14meq take 1 tablet twice a day   Labwork: Your physician recommends that you return for lab work in: TODAY-BMET Your physician recommends that you return for lab work in: [place] (11/15/2017)  Testing/Procedures: None  Follow-Up: Your physician recommends that you return for lab work in: 2-3 MONTHS WITH DR [name]  Any Other Special Instructions Will Be Listed Below (If Applicable). PLEASE CONTACT DR PAL'S OFFICE AND SCHEDULED AN APPOINTMENT  Heart Failure Prevention Plan: 1. Avoid salt 2. Limit fluid intake to 32-64oz per day 3. Weigh yourself every morning, call cardiology if weight increase by more than 3 lbs overnight or 5 lbs in a single week.  If you need a refill on your cardiac medications before your next appointment, please call your pharmacy.

## 2017-11-09 ENCOUNTER — Ambulatory Visit: Payer: Self-pay | Admitting: *Deleted

## 2017-11-09 ENCOUNTER — Encounter: Payer: Self-pay | Admitting: Family Medicine

## 2017-11-09 ENCOUNTER — Ambulatory Visit (INDEPENDENT_AMBULATORY_CARE_PROVIDER_SITE_OTHER): Payer: PPO | Admitting: Family Medicine

## 2017-11-09 VITALS — BP 132/78 | HR 78 | Temp 98.5°F | Ht 67.0 in | Wt 216.6 lb

## 2017-11-09 DIAGNOSIS — N184 Chronic kidney disease, stage 4 (severe): Secondary | ICD-10-CM | POA: Diagnosis not present

## 2017-11-09 DIAGNOSIS — R1011 Right upper quadrant pain: Secondary | ICD-10-CM

## 2017-11-09 DIAGNOSIS — I5022 Chronic systolic (congestive) heart failure: Secondary | ICD-10-CM | POA: Diagnosis not present

## 2017-11-09 DIAGNOSIS — Z794 Long term (current) use of insulin: Secondary | ICD-10-CM | POA: Diagnosis not present

## 2017-11-09 DIAGNOSIS — I25119 Atherosclerotic heart disease of native coronary artery with unspecified angina pectoris: Secondary | ICD-10-CM | POA: Diagnosis not present

## 2017-11-09 DIAGNOSIS — E1122 Type 2 diabetes mellitus with diabetic chronic kidney disease: Secondary | ICD-10-CM | POA: Diagnosis not present

## 2017-11-09 LAB — BASIC METABOLIC PANEL
BUN/Creatinine Ratio: 15 (ref 10–24)
BUN: 45 mg/dL — ABNORMAL HIGH (ref 8–27)
CALCIUM: 8.6 mg/dL (ref 8.6–10.2)
CHLORIDE: 112 mmol/L — AB (ref 96–106)
CO2: 18 mmol/L — ABNORMAL LOW (ref 20–29)
Creatinine, Ser: 2.93 mg/dL — ABNORMAL HIGH (ref 0.76–1.27)
GFR, EST AFRICAN AMERICAN: 22 mL/min/{1.73_m2} — AB (ref >59)
GFR, EST NON AFRICAN AMERICAN: 19 mL/min/{1.73_m2} — AB (ref >59)
Glucose: 89 mg/dL (ref 65–99)
POTASSIUM: 4.8 mmol/L (ref 3.5–5.2)
SODIUM: 144 mmol/L (ref 134–144)

## 2017-11-09 NOTE — Assessment & Plan Note (Signed)
Patient seems to be at a bit of a Crossroads.  He is worried about the prospect of dialysis.  He is also worried about the strain on his kidneys from the increased Lasix.  On the other hand he really enjoys certain foods such as bacon-in fact he had made himself a plate of bacon this morning when he realized that he should not eat it because of the high level of salt.  He wants a referral to the nutritionist to discuss complex interplay renal diet, low-salt diet, carbohydrate moderation diet needed for diabetes.  I have reached out to Inda Coke for her opinion about patient seeing her versus possible diabetes education versus other options.  Also at this Crossroads- discussed with patient recent decline in his health possibility this could continue.  He tells me he would not be interested in dialysis if it came to that.  I did reassure him that his GFR has honestly been somewhat stable over the last few years though it has had some fluctuations.  He sees Dr. Florene Glen next week and can get his opinion as well.  With all that being said-I offered a referral to palliative care to help maximize following through with his wishes as well as quality of life.  He and his daughter-in-law agreed to this.  We discussed the difference between palliative care and hospice.

## 2017-11-09 NOTE — Telephone Encounter (Signed)
Patient had made appointment for abdominal pain for next Monday. The office would like the patient triaged for the pain- attempted to speak with patient- but he is reluctant to speak with me because of his hearing problem. He does states he has sharp pain with deep breathing. This has been going on for 3 weeks. He has not mentioned this to any providers.  He was seen by cardiology for his swelling and has had an increase in his lasix- that appointment was yesterday.  He asked me to call [name] to see if she could make arrangements to bring him sooner than the Monday appointment. Per Linda:During surgery- gallbladder laparoscopic it took- 1hour 40 minutes to get through scar tissue so the surgery ran long.Patient had a small heart attack after surgery which kept him in hospital. The  Kidney function is  so bad they can't do surgery yet- stints are not an option. Slow release nitroglycerin is being used at this point. They are very reserved at this time about treatment and options. [name] feels that some of the abdominal pain could be from the surgical site - Dr Dema Severin is the surgeon. She states the patient seems to be very down at this time- he has recently lost a close friend as well. She will bring him today.

## 2017-11-09 NOTE — Progress Notes (Signed)
Subjective:  Rodney Farley is a 84 y.o. year old very pleasant male patient who presents for/with See problem oriented charting ROS-complains of right upper abdominal pain with deep breaths.  Edema slightly improved.  Denies true hypoglycemia.  Denies chest pain  Past Medical History-  Patient Active Problem List   Diagnosis Date Noted  . Ruptured appendicitis 03/26/2017    Priority: High  . Marital stress 02/18/2017    Priority: High  . COPD (chronic obstructive pulmonary disease) ([place])     Priority: High  . Chronic systolic CHF (congestive heart failure) ([place]) 04/08/2015    Priority: High  . DOE (dyspnea on exertion), due to significant CAD 03/28/2015    Priority: High  . Chronic kidney disease (CKD), stage IV (severe) ([place]) 03/04/2015    Priority: High  . Coronary artery disease involving native coronary artery of native heart with angina pectoris ([place]) 09/13/2014    Priority: High  . 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 Controlled type 2 diabetes mellitus with stage 4 chronic kidney disease ([place]) 08/05/2012    Priority: High  . Ischemic cardiomyopathy 11/03/2011    Priority: High  . Atrioventricular block, complete (HCC)     Priority: High  . Pacemaker     Priority: High  . BPH associated with nocturia 04/27/2016    Priority: Medium  . Diabetic peripheral neuropathy (HCC)     Priority: Medium  . Bell's palsy     Priority: Medium  . Gout 06/26/2015    Priority: Medium  . Sleep apnea 08/05/2012    Priority: Medium  . HTN (hypertension) 08/05/2012    Priority: Medium  . Diverticulitis 04/08/2015    Priority: Low  . GERD (gastroesophageal reflux disease) 08/07/2012    Priority: Low  . Obesity     Priority: Low  . Elevated troponin 10/15/2017  . S/P cholecystectomy 10/13/2017  . Symptomatic cholelithiasis 10/13/2017  . Epistaxis 10/13/2017  . Left shoulder pain 10/13/2017  . Diarrhea 05/11/2017  . Trigger finger  06/10/2016  . Angina pectoris ([place]) 10/16/2014    Medications- reviewed and updated Current Outpatient Medications  Medication Sig Dispense Refill  . albuterol (PROVENTIL HFA;VENTOLIN HFA) 108 (90 Base) MCG/ACT inhaler Inhale 2 puffs into the lungs every 6 (six) hours as needed for wheezing or shortness of breath. 1 Inhaler 2  . aspirin EC 81 MG tablet Take 81 mg by mouth daily.    [name] atorvastatin (LIPITOR) 40 MG tablet Take 1 tablet (40 mg total) by mouth daily. 90 tablet 3  . Blood Glucose Monitoring Suppl (FREESTYLE LITE) DEVI     . carvedilol (COREG) 6.25 MG tablet Take 1 tablet (6.25 mg total) by mouth 2 (two) times daily with a meal. 180 tablet 3  . furosemide (LASIX) 40 MG tablet Take 1 tablet (40 mg total) by mouth 2 (two) times daily. 60 tablet 6  . gabapentin (NEURONTIN) 100 MG capsule Take 1 capsule (100 mg total) at bedtime by mouth. 90 capsule 1  . glucose blood (FREESTYLE TEST STRIPS) test strip Use to check blood sugar daily 100 each 3  . glucose monitoring kit (FREESTYLE) monitoring kit USE TO MONITOR BLOOD GLUCOSE AS DIRECTED 1 each 1  . Insulin Glargine (LANTUS SOLOSTAR) 100 UNIT/ML Solostar Pen Inject 60 Units into the skin daily at 10 pm. (Patient taking differently: Inject 40 Units into the skin daily at 10 pm. ) 5 pen PRN  . Insulin Pen Needle (UNIFINE PENTIPS) 31G  X 5 MM MISC USE AS DIRECTED 100 each 0  . isosorbide mononitrate (IMDUR) 60 MG 24 hr tablet Take 1 tablet (60 mg total) by mouth daily. 30 tablet 6  . lisinopril (PRINIVIL,ZESTRIL) 2.5 MG tablet Take 1 tablet (2.5 mg total) by mouth daily. 90 tablet 3  . nitroGLYCERIN (NITROSTAT) 0.4 MG SL tablet Place 1 tablet (0.4 mg total) under the tongue every 5 (five) minutes as needed for chest pain. 2 doses before calling for emergent help 25 tablet 3  . oxymetazoline (AFRIN) 0.05 % nasal spray Place 3 sprays into both nostrils 2 (two) times daily as needed (nose bleed). 30 mL 0  . pantoprazole (PROTONIX) 40 MG tablet  TAKE 1 TABLET BY MOUTH ONCE DAILY (Patient taking differently: TAKE 1 TABLET (40 MG) BY MOUTH ONCE DAILY) 90 tablet 3  . polyethylene glycol (MIRALAX / GLYCOLAX) packet Take 17 g by mouth daily. 14 each 0  . Polyethylene Glycol 400 (BLINK TEARS) 0.25 % SOLN Place 1 drop into both eyes 3 (three) times daily.    . potassium chloride SA (KLOR-CON M20) 20 MEQ tablet Take 1 tablet (20 mEq total) by mouth 2 (two) times daily. 60 tablet 6  . sertraline (ZOLOFT) 100 MG tablet Take 1.5 tablets (150 mg total) by mouth daily. 135 tablet 1  . tamsulosin (FLOMAX) 0.4 MG CAPS capsule TAKE 1 CAPSULE BY MOUTH DAILY AFTER SUPPER (Patient taking differently: Take 0.4 mg by mouth daily after supper. ) 90 capsule 3  . traMADol (ULTRAM) 50 MG tablet Take 1 tablet (50 mg total) by mouth every 6 (six) hours as needed (pain not controlled with tylenol). 30 tablet 0  . triamcinolone cream (KENALOG) 0.1 % Apply 1 application topically 2 (two) times daily. For 7-10 days maximum 80 g 0  . vitamin B-12 (CYANOCOBALAMIN) 1000 MCG tablet Take 1,000 mcg by mouth daily.     No current facility-administered medications for this visit.     Objective: BP 132/78 (BP Location: Left Arm, Patient Position: Sitting, Cuff Size: Normal)   Pulse 78   Temp 98.5 F (36.9 C) (Oral)   Ht '5\' 7"'  (1.702 m)   Wt 216 lb 9.6 oz (98.2 kg)   SpO2 95%   BMI 33.92 kg/m  Gen: NAD, slightly disheveled compared to baseline CV: RRR no murmurs rubs or gallops Lungs: CTAB no crackles, wheeze, rhonchi Abdomen: soft/nontender/nondistended/normal bowel sounds.  Obese Ext: 2-3+ edema Skin: warm, dry Neuro: Walks with cane, normal speech  Assessment/Plan:  Right upper abdominal pain S:  sharp pain with deep breathing reported to [facility] this AM- reported for 3 weeks- basically since his surgery. He had prolonged laparascopic gallbladder surgery-  Prolonged surgery due to scar tissue for symptomatic cholelithiasis.  Had just seen cardiology yesterday but  declined to mention at that time.  Pain is up to a 2 out of 10 but has been stable over the last 1.5 weeks.  This pain only happens with deep breaths.  He denies pain at rest.  He states right it after the surgery his pain was only 1 out of 10. A/P: This is a somewhat minor pain after a rather significant surgery.  Patient has follow-up with his surgeon on the eighth of the May.  I told patient and his daughter-in-law that we should continue to monitor only.  They will follow-up if he has new or worsening symptoms.  Chronic systolic CHF (congestive heart failure) (HCC) Lasix was increased yesterday due to increased edema.  He reports  mild improvement today in edema.  Continue with current cardiology instructions.   Chronic kidney disease (CKD), stage IV (severe) ([place])  Patient seems to be at a bit of a Crossroads.  He is worried about the prospect of dialysis.  He is also worried about the strain on his kidneys from the increased Lasix.  On the other hand he really enjoys certain foods such as bacon-in fact he had made himself a plate of bacon this morning when he realized that he should not eat it because of the high level of salt.  He wants a referral to the nutritionist to discuss complex interplay renal diet, low-salt diet, carbohydrate moderation diet needed for diabetes.  I have reached out to Inda Coke for her opinion about patient seeing her versus possible diabetes education versus other options.  Also at this Crossroads- discussed with patient recent decline in his health possibility this could continue.  He tells me he would not be interested in dialysis if it came to that.  I did reassure him that his GFR has honestly been somewhat stable over the last few years though it has had some fluctuations.  He sees Dr. Florene Glen next week and can get his opinion as well.  With all that being said-I offered a referral to palliative care to help maximize following through with his wishes as well as  quality of life.  He and his daughter-in-law agreed to this.  We discussed the difference between palliative care and hospice.  Coronary artery disease involving native coronary artery of native heart with angina pectoris ([place]) Had elevated troponin from surgery without EKG change= NSTEMI. Troponin peaked around 2. Was placed on heparin drip- medical management instead of cath ultimately decided upon with poor kidney function. Started on imdur.  He has been chest pain-free at home.  I think the decision to not proceed with catheterization at this time is reasonable- also interested in palliative care's input.  Controlled type 2 diabetes mellitus with stage 4 chronic kidney disease ([place]) Home numbers have got as low as 76.  He has remained on 25 units of Lantus.  He will send me an update on his home numbers overall but states usually around 100.  Luckily they checked an A1c in the hospital Lab Results  Component Value Date   HGBA1C 5.6 10/07/2017  I instructed him to reduce his Lantus to 20 units.  We discussed the importance of avoiding low blood sugars   Future Appointments  Date Time Provider [place]  11/15/2017 11:00 AM [name], MD [facility]  12/20/2017 12:10 PM [facility] DEVICE REMOTES [facility]  03/11/2018  3:00 PM [name], Marilyn M, MD [facility]   Advised 3 to 15-monthfollow-up at the latest.  Lab/Order associations: RUQ pain  Chronic systolic CHF (congestive heart failure) ([place] - Plan: Amb Referral to Palliative Care  Chronic kidney disease (CKD), stage IV (severe) ([facility] - Plan: Amb Referral to Palliative Care  Coronary artery disease involving native coronary artery of native heart with angina pectoris ([place] - Plan: Amb Referral to Palliative Care  Controlled type 2 diabetes mellitus with stage 4 chronic kidney disease, with long-term current use of insulin ([place]  Time Stamp The duration of face-to-face time during this visit was  greater than 40 minutes (5 PM to '5 4 3 ' PM). Greater than 50% of this time was spent in counseling, explanation of diagnosis, planning of further management, and/or coordination of care including discussion of balance between the heart  and kidneys, discussion of palliative care, discussion of heart catheterization potential.     Return precautions advised.  Garret Reddish, MD

## 2017-11-09 NOTE — Telephone Encounter (Signed)
Appointment on 6th was not cancelled.

## 2017-11-09 NOTE — Patient Instructions (Signed)
Reduce lantus to 20 units   We will reach out to you after I hear back from [place] our wonderful registered dietician and PA  We will place a referral to palliative care. I would want you to do this on a day when family is available. Call [place]

## 2017-11-09 NOTE — Addendum Note (Signed)
Addended by: [name] on: 11/09/2017 04:47 PM   Modules accepted: Orders

## 2017-11-09 NOTE — Assessment & Plan Note (Signed)
Lasix was increased yesterday due to increased edema.  He reports mild improvement today in edema.  Continue with current cardiology instructions.

## 2017-11-09 NOTE — Assessment & Plan Note (Signed)
Home numbers have got as low as 76.  He has remained on 25 units of Lantus.  He will send me an update on his home numbers overall but states usually around 100.  Luckily they checked an A1c in the hospital Lab Results  Component Value Date   HGBA1C 5.6 10/07/2017  I instructed him to reduce his Lantus to 20 units.  We discussed the importance of avoiding low blood sugars

## 2017-11-09 NOTE — Assessment & Plan Note (Signed)
Had elevated troponin from surgery without EKG change= NSTEMI. Troponin peaked around 2. Was placed on heparin drip- medical management instead of cath ultimately decided upon with poor kidney function. Started on imdur.  He has been chest pain-free at home.  I think the decision to not proceed with catheterization at this time is reasonable- also interested in palliative care's input.

## 2017-11-09 NOTE — Telephone Encounter (Signed)
Should be cancelled

## 2017-11-10 NOTE — Telephone Encounter (Signed)
Appointment has been cancelled.

## 2017-11-15 ENCOUNTER — Ambulatory Visit: Payer: PPO | Admitting: Family Medicine

## 2017-11-15 DIAGNOSIS — R600 Localized edema: Secondary | ICD-10-CM | POA: Diagnosis not present

## 2017-11-16 ENCOUNTER — Other Ambulatory Visit: Payer: Self-pay

## 2017-11-16 DIAGNOSIS — E1122 Type 2 diabetes mellitus with diabetic chronic kidney disease: Secondary | ICD-10-CM

## 2017-11-16 DIAGNOSIS — I5022 Chronic systolic (congestive) heart failure: Secondary | ICD-10-CM

## 2017-11-16 DIAGNOSIS — I10 Essential (primary) hypertension: Secondary | ICD-10-CM

## 2017-11-16 DIAGNOSIS — N184 Chronic kidney disease, stage 4 (severe): Secondary | ICD-10-CM

## 2017-11-16 LAB — BASIC METABOLIC PANEL
BUN/Creatinine Ratio: 18 (ref 10–24)
BUN: 58 mg/dL — AB (ref 8–27)
CALCIUM: 8.9 mg/dL (ref 8.6–10.2)
CO2: 17 mmol/L — AB (ref 20–29)
CREATININE: 3.3 mg/dL — AB (ref 0.76–1.27)
Chloride: 109 mmol/L — ABNORMAL HIGH (ref 96–106)
GFR calc Af Amer: 19 mL/min/{1.73_m2} — ABNORMAL LOW (ref >59)
GFR, EST NON AFRICAN AMERICAN: 16 mL/min/{1.73_m2} — AB (ref >59)
Glucose: 108 mg/dL — ABNORMAL HIGH (ref 65–99)
Potassium: 5.7 mmol/L — ABNORMAL HIGH (ref 3.5–5.2)
Sodium: 142 mmol/L (ref 134–144)

## 2017-11-16 NOTE — Progress Notes (Signed)
Patient is scheduled to see Inda Coke, PA this Thursday, Nov 18, 2017

## 2017-11-16 NOTE — Progress Notes (Signed)
Referral placed for diabetes nutrition management.

## 2017-11-16 NOTE — Progress Notes (Signed)
bmet  

## 2017-11-18 ENCOUNTER — Ambulatory Visit: Payer: PPO | Admitting: Physician Assistant

## 2017-11-18 ENCOUNTER — Other Ambulatory Visit: Payer: Self-pay | Admitting: Family Medicine

## 2017-11-23 DIAGNOSIS — I5022 Chronic systolic (congestive) heart failure: Secondary | ICD-10-CM | POA: Diagnosis not present

## 2017-11-23 DIAGNOSIS — I10 Essential (primary) hypertension: Secondary | ICD-10-CM | POA: Diagnosis not present

## 2017-11-23 LAB — BASIC METABOLIC PANEL
BUN / CREAT RATIO: 16 (ref 10–24)
BUN: 52 mg/dL — ABNORMAL HIGH (ref 8–27)
CO2: 19 mmol/L — ABNORMAL LOW (ref 20–29)
Calcium: 8.7 mg/dL (ref 8.6–10.2)
Chloride: 111 mmol/L — ABNORMAL HIGH (ref 96–106)
Creatinine, Ser: 3.17 mg/dL — ABNORMAL HIGH (ref 0.76–1.27)
GFR calc non Af Amer: 17 mL/min/{1.73_m2} — ABNORMAL LOW (ref >59)
GFR, EST AFRICAN AMERICAN: 20 mL/min/{1.73_m2} — AB (ref >59)
Glucose: 138 mg/dL — ABNORMAL HIGH (ref 65–99)
POTASSIUM: 5.2 mmol/L (ref 3.5–5.2)
SODIUM: 142 mmol/L (ref 134–144)

## 2017-11-25 ENCOUNTER — Encounter: Payer: Self-pay | Admitting: Physician Assistant

## 2017-11-25 ENCOUNTER — Ambulatory Visit (INDEPENDENT_AMBULATORY_CARE_PROVIDER_SITE_OTHER): Payer: PPO | Admitting: Physician Assistant

## 2017-11-25 VITALS — BP 140/70 | HR 74 | Temp 98.0°F | Ht 67.0 in | Wt 210.2 lb

## 2017-11-25 DIAGNOSIS — N184 Chronic kidney disease, stage 4 (severe): Secondary | ICD-10-CM | POA: Diagnosis not present

## 2017-11-25 DIAGNOSIS — I5022 Chronic systolic (congestive) heart failure: Secondary | ICD-10-CM

## 2017-11-25 DIAGNOSIS — Z713 Dietary counseling and surveillance: Secondary | ICD-10-CM

## 2017-11-25 DIAGNOSIS — E1122 Type 2 diabetes mellitus with diabetic chronic kidney disease: Secondary | ICD-10-CM | POA: Diagnosis not present

## 2017-11-25 NOTE — Progress Notes (Signed)
Rodney Farley is a 84 y.o. male here for Nutrition Consult  I acted as a Education administrator for [employer], PA-C Anselmo Pickler, LPN  History of Present Illness:   Chief Complaint  Patient presents with  . Nutrition Counseling    HPI  Patient is here to discuss Nutrition and Diet.  Has chronic kidney disease. Currently stage VI. Sees a nephrologist (Dr. Florene Glen) -- seeing him next month.  Lab Results  Component Value Date   HGBA1C 5.6 10/07/2017   Dietary recall: Wakes up at 6am-8am Breakfast: 6oz milk with medications; scrambled eggs on toast; no salt; SKIPS; milk Lunch: SKIPS OR reuben sandwich; snack of cheese and tomato; fruit (oranges and grapefruit) Dinner: all sorts of foods --> top ramen with vegetable (no seasoning), fish, ribs, frozen raspberries Snacks: cheese Beverages: drinks 3-4 glasses of milk daily  "I have a gluttony of milk"  Weight: Wt Readings from Last 3 Encounters:  11/25/17 210 lb 4 oz (95.4 kg)  11/09/17 216 lb 9.6 oz (98.2 kg)  11/08/17 219 lb (99.3 kg)   Support system: Drives self Support system (physicians and son/DIL)  Lab Results  Component Value Date   CALCIUM 8.7 11/23/2017   PHOS 5.0 (H) 10/14/2017   Lab Results  Component Value Date   K 5.2 11/23/2017      Past Medical History:  Diagnosis Date  . AICD (automatic cardioverter/defibrillator) present   . AKI (acute kidney injury) ([place]) 03/27/2017  . Anemia   . Anginal pain ([place])   . Anxiety   . Arm pain 05/08/2015   LEFT ARM  . Atrioventricular block, complete ([place])    a. 2010 s/p pacemaker.  . Bell's palsy    left side. after shingles episode  . Bilateral renal cysts 07/23/2017   Simple and hemorrhagic noted on CT ab/pelvis   . BPH associated with nocturia   . Cardiomegaly   . Chronic systolic CHF (congestive heart failure) ([place])    a. LVEF 35-40% by echo 09/2014.  [name] CKD (chronic kidney disease), stage IV ([place])   . COPD (chronic obstructive pulmonary disease)  (HCC)    Severe  . Coronary artery disease    a. s/p MI in 1994/1995 while in [place] s/p questionable PCI. 03/2015: progression of disease, for staged PCI.  [name] Depression   . Diabetic peripheral neuropathy ([place])   . Diarrhea   . Diverticulitis   . DOE (dyspnea on exertion) 03/28/2015  . Gallstones   . GERD (gastroesophageal reflux disease)   . Gout   . Hard of hearing   . History of chronic pancreatitis 07/23/2017   noted on CT abd/pelvis  . History of shingles   . Hypercholesterolemia   . Hypertension   . Ischemic cardiomyopathy   . MI (myocardial infarction) ([place]) 1994; 1995  . Neuropathy    IN LOWER EXTREMITIES  . Nosebleed 10/06/2017   for 2 months most recent 10/06/2017  . Obesity   . Pacemaker    medtronic>>> MDT ICD 09/23/15  . Ruptured appendicitis   . Sleep apnea    "sleeps w/humidifyer when he panics and gets short of breath" (04/08/2015)  . TIA (transient ischemic attack) X 3  . Trigger middle finger of left hand   . Type II diabetes mellitus ([place])      Social History   Socioeconomic History  . Marital status: Married    Spouse name: Not on file  . Number of children: 1  . Years of education: college  . Highest  education level: Not on file  Occupational History  . Occupation: Retired  Scientific laboratory technician  . Financial resource strain: Not on file  . Food insecurity:    Worry: Not on file    Inability: Not on file  . Transportation needs:    Medical: Not on file    Non-medical: Not on file  Tobacco Use  . Smoking status: Former Smoker    Packs/day: 1.50    Years: 54.00    Pack years: 81.00    Types: Cigarettes    Last attempt to quit: 07/18/2007    Years since quitting: 10.3  . Smokeless tobacco: Never Used  . Tobacco comment: did not discuss LdCT but being evaluated for chole currently  Substance and Sexual Activity  . Alcohol use: Yes    Comment: 04/08/2015 "probably 3 drinks/month"  . Drug use: No  . Sexual activity: Never  Lifestyle  . Physical  activity:    Days per week: Not on file    Minutes per session: Not on file  . Stress: Not on file  Relationships  . Social connections:    Talks on phone: Not on file    Gets together: Not on file    Attends religious service: Not on file    Active member of club or organization: Not on file    Attends meetings of clubs or organizations: Not on file    Relationship status: Not on file  . Intimate partner violence:    Fear of current or ex partner: Not on file    Emotionally abused: Not on file    Physically abused: Not on file    Forced sexual activity: Not on file  Other Topics Concern  . Not on file  Social History Narrative   Married 1994 (together since 1989) 1 son, 1 stepson. 3 grandkids, 6 great grandkids      Retired from [employer]. Had 2 years of collge.       Faith: Mormon    Past Surgical History:  Procedure Laterality Date  . CARDIAC CATHETERIZATION N/A 03/29/2015   Procedure: Right/Left Heart Cath and Coronary Angiography;  Surgeon: Tadao M [name], MD;  Location: [facility];  Service: Cardiovascular;  Laterality: N/A;  . [place]   "after my MI; put me on heart RX after cath"  . CARDIAC CATHETERIZATION N/A 04/09/2015   Procedure: Coronary Stent Intervention;  Surgeon: Damarko M [name], MD;  Location: [facility];  Service: Cardiovascular;  Laterality: N/A;  . CHOLECYSTECTOMY N/A 10/13/2017   Procedure: LAPAROSCOPIC CHOLECYSTECTOMY WITH LYSIS OF ADHESIONS;  Surgeon: Ileana Roup, MD;  Location: [facility];  Service: General;  Laterality: N/A;  . COLONOSCOPY    . DENTAL SURGERY    . EP IMPLANTABLE DEVICE N/A 09/23/2015   MDT CRT-D, Dr. Caryl Comes  . HIATAL HERNIA REPAIR  1977  . ILEOCECETOMY N/A 03/27/2017   Procedure: ILEOCECECTOMY;  Surgeon: Ileana Roup, MD;  Location: [facility];  Service: General;  Laterality: N/A;  . INSERT / REPLACE / REMOVE PACEMAKER  07/2008   Complete heart block status post DDD with good function  .  LAPAROTOMY N/A 03/27/2017   Procedure: EXPLORATORY LAPAROTOMY;  Surgeon: Ileana Roup, MD;  Location: [facility];  Service: General;  Laterality: N/A;  . TONSILLECTOMY    . UPPER GI ENDOSCOPY      Family History  Problem Relation Age of Onset  . Stroke Mother   . Leukemia Father   .  Stroke Sister   . Heart attack Brother     Allergies  Allergen Reactions  . Bee Venom Anaphylaxis  . Lyrica [Pregabalin] Other (See Comments)    hallucinations  . Prednisone Other (See Comments)    hallucinations  . Zocor [Simvastatin] Nausea Only and Other (See Comments)    Headache with brand name only.  Can take the generic.    Current Medications:   Current Outpatient Medications:  .  albuterol (PROVENTIL HFA;VENTOLIN HFA) 108 (90 Base) MCG/ACT inhaler, Inhale 2 puffs into the lungs every 6 (six) hours as needed for wheezing or shortness of breath., Disp: 1 Inhaler, Rfl: 2 .  aspirin EC 81 MG tablet, Take 81 mg by mouth daily., Disp: , Rfl:  .  atorvastatin (LIPITOR) 40 MG tablet, Take 1 tablet (40 mg total) by mouth daily., Disp: 90 tablet, Rfl: 3 .  Blood Glucose Monitoring Suppl (FREESTYLE LITE) DEVI, , Disp: , Rfl:  .  carvedilol (COREG) 6.25 MG tablet, Take 1 tablet (6.25 mg total) by mouth 2 (two) times daily with a meal., Disp: 180 tablet, Rfl: 3 .  furosemide (LASIX) 40 MG tablet, Take 1 tablet (40 mg total) by mouth 2 (two) times daily., Disp: 60 tablet, Rfl: 6 .  gabapentin (NEURONTIN) 100 MG capsule, Take 1 capsule (100 mg total) at bedtime by mouth., Disp: 90 capsule, Rfl: 1 .  glucose blood (FREESTYLE TEST STRIPS) test strip, Use to check blood sugar daily, Disp: 100 each, Rfl: 3 .  glucose monitoring kit (FREESTYLE) monitoring kit, USE TO MONITOR BLOOD GLUCOSE AS DIRECTED, Disp: 1 each, Rfl: 1 .  Insulin Glargine (LANTUS SOLOSTAR) 100 UNIT/ML Solostar Pen, Inject 60 Units into the skin daily at 10 pm. (Patient taking differently: Inject 40 Units into the skin daily at 10 pm. ),  Disp: 5 pen, Rfl: PRN .  Insulin Pen Needle (UNIFINE PENTIPS) 31G X 5 MM MISC, USE AS DIRECTED, Disp: 100 each, Rfl: 0 .  isosorbide mononitrate (IMDUR) 60 MG 24 hr tablet, Take 1 tablet (60 mg total) by mouth daily., Disp: 30 tablet, Rfl: 6 .  nitroGLYCERIN (NITROSTAT) 0.4 MG SL tablet, Place 1 tablet (0.4 mg total) under the tongue every 5 (five) minutes as needed for chest pain. 2 doses before calling for emergent help, Disp: 25 tablet, Rfl: 3 .  oxymetazoline (AFRIN) 0.05 % nasal spray, Place 3 sprays into both nostrils 2 (two) times daily as needed (nose bleed)., Disp: 30 mL, Rfl: 0 .  pantoprazole (PROTONIX) 40 MG tablet, TAKE 1 TABLET BY MOUTH ONCE DAILY (Patient taking differently: TAKE 1 TABLET (40 MG) BY MOUTH ONCE DAILY), Disp: 90 tablet, Rfl: 3 .  polyethylene glycol (MIRALAX / GLYCOLAX) packet, Take 17 g by mouth daily., Disp: 14 each, Rfl: 0 .  Polyethylene Glycol 400 (BLINK TEARS) 0.25 % SOLN, Place 1 drop into both eyes 3 (three) times daily., Disp: , Rfl:  .  sertraline (ZOLOFT) 100 MG tablet, Take 1.5 tablets (150 mg total) by mouth daily., Disp: 135 tablet, Rfl: 1 .  tamsulosin (FLOMAX) 0.4 MG CAPS capsule, TAKE 1 CAPSULE BY MOUTH DAILY AFTER SUPPER (Patient taking differently: Take 0.4 mg by mouth daily after supper. ), Disp: 90 capsule, Rfl: 3 .  traMADol (ULTRAM) 50 MG tablet, Take 1 tablet (50 mg total) by mouth every 6 (six) hours as needed (pain not controlled with tylenol)., Disp: 30 tablet, Rfl: 0 .  triamcinolone cream (KENALOG) 0.1 %, APPLY CREAM EXTERNALLY TWICE DAILY FOR 7 TO 10 DAYS  MAXMIUM, Disp: 80 g, Rfl: 0 .  vitamin B-12 (CYANOCOBALAMIN) 1000 MCG tablet, Take 1,000 mcg by mouth daily., Disp: , Rfl:    Review of Systems:   ROS  Negative unless otherwise specified per HPI.  Vitals:   Vitals:   11/25/17 1006  BP: 140/70  Pulse: 74  Temp: 98 F (36.7 C)  TempSrc: Oral  SpO2: 98%  Weight: 210 lb 4 oz (95.4 kg)  Height: '5\' 7"'  (1.702 m)     Body mass  index is 32.93 kg/m.  Physical Exam:   Physical Exam  Constitutional: He is oriented to person, place, and time. He appears well-developed.  HENT:  Head: Normocephalic.  Eyes: Pupils are equal, round, and reactive to light.  Neck: Normal range of motion.  Pulmonary/Chest: Effort normal.  Musculoskeletal: Normal range of motion.  Neurological: He is alert and oriented to person, place, and time.  Skin: Skin is warm and dry.  Psychiatric: He has a normal mood and affect. His behavior is normal. Judgment and thought content normal.  Nursing note and vitals reviewed.   Assessment and Plan:    Rodney Farley was seen today for nutrition counseling.  Diagnoses and all orders for this visit:  Controlled type 2 diabetes mellitus with stage 4 chronic kidney disease, unspecified whether long term insulin use (HCC)  Chronic systolic CHF (congestive heart failure) ([place])  Encounter for nutritional counseling   Reviewed several recommendations for patient today.  Reviewed most recent lab work, potassium is on the upper end of normal and phosphorus is elevated.  Focus today on choosing low phosphorus and low potassium foods.  Encouraged low-sodium diet as well.   Specific recommendations for him include decreasing phosphorus intake, specifically milk, as he is currently drinking around 32 ounces of milk daily as well as several portions of cheese throughout the day.  We also discussed switching out his high potassium fruits to lower potassium fruits and I provided him with specific examples of this.  We also discussed importance of not skipping meals as he is currently skipping both breakfast and lunch on most days of the week even though he is diabetic.  I asked him to review his salt substitutes to make sure that none of them contain potassium.  Patient verbalized understanding was appreciative of information.  He does have a follow-up appointment scheduled with an outpatient registered dietitian in  approximately 1 to 2 weeks.  I advised that he keep this appointment.   I spent 25 minutes with this patient, greater than 50% was face-to-face time counseling regarding the above diagnoses.  . Reviewed expectations re: course of current medical issues. . Discussed self-management of symptoms. . Outlined signs and symptoms indicating need for more acute intervention. . Patient verbalized understanding and all questions were answered. . See orders for this visit as documented in the electronic medical record. . Patient received an After-Visit Summary.  CMA or LPN served as scribe during this visit. History, Physical, and Plan performed by medical provider. Documentation and orders reviewed and attested to.  Inda Coke, PA-C

## 2017-11-25 NOTE — Patient Instructions (Addendum)
Decrease milk to no more than 6-8 oz (1 cup) total in 1 day. Limit cheese.  Switch out grapefruits and oranges for fruits with less amounts of potassium (look at the pyramid page for more examples).  Do not go more than 3-4 waking hours without eating anything to keep your blood sugar regulated.  Do not use any salt substitutes that contain potassium!!!!

## 2017-11-29 ENCOUNTER — Other Ambulatory Visit: Payer: Self-pay | Admitting: Family Medicine

## 2017-11-29 ENCOUNTER — Encounter: Payer: Self-pay | Admitting: Family Medicine

## 2017-12-08 ENCOUNTER — Encounter: Payer: PPO | Attending: Family Medicine | Admitting: Skilled Nursing Facility1

## 2017-12-08 ENCOUNTER — Encounter: Payer: Self-pay | Admitting: Skilled Nursing Facility1

## 2017-12-08 DIAGNOSIS — N184 Chronic kidney disease, stage 4 (severe): Secondary | ICD-10-CM | POA: Diagnosis not present

## 2017-12-08 DIAGNOSIS — I5022 Chronic systolic (congestive) heart failure: Secondary | ICD-10-CM | POA: Diagnosis not present

## 2017-12-08 DIAGNOSIS — E1122 Type 2 diabetes mellitus with diabetic chronic kidney disease: Secondary | ICD-10-CM | POA: Diagnosis not present

## 2017-12-08 DIAGNOSIS — Z713 Dietary counseling and surveillance: Secondary | ICD-10-CM | POA: Insufficient documentation

## 2017-12-08 NOTE — Progress Notes (Signed)
  Assessment:  Primary concerns today: kidney disease.  Pt states his appendix burst in September.  Pt states he was 246 pounds and is now 210 pounds.  Pt was shaky upon getting up. Pt's hearing aid fell out and he put it back in.  Pt states he has a fluid restriction of 32 ounces.  Pt states he stays thirsty all of the time.  Pt states he struggles with not snacking and making changes to his diet.  Pt was Very upset (sad) the whole appointment stating he does not want to be a burden on his son and states his wife left him in August. Pt states he thinks he has dementia. Pt states he cooks his own meals.  Pt states his dog is 96 years old.  Pt states he does not add salt to his food.  Pt has had low blood sugars ending in a fall with injury.  Pt states he is forgetful of taking his medicine stating his daughter in law is going to help him think of a way to know he has taken his medication.  Pts son and daughter in law help take care of him. MEDICATIONS: See List   DIETARY INTAKE:  24-hr recall:  B ( 6-10AM): weighs himself and takes medication and takes blood sugar: drinking milk with medication mostly skipped breakfast sometimes oatmeal Snk ( AM):  L ( PM): toast with butter Snk ( PM): tomatoes with salad dressing  D ( PM): fish or red meat or hamburger and cole slaw or ramen noodles and fresh vegetables  Snk ( PM): medication with milk Beverages: orange juice or lemonade with crystal light with a lot of ice  Usual physical activity: ADL's  Estimated energy needs: 1800 calories 200 g carbohydrates 135 g protein 50 g fat  Progress Towards Goal(s):  In progress.   Nutritional Diagnosis:  NB-1.1 Food and nutrition-related knowledge deficit As related to newly diagnosed stage 4 kidney disease.  As evidenced by referral.    Intervention:  Nutrition counseling. Goals: -Try frozen grapes or ice cubes for thirst -Have only 1 serving of tomato a day -Eat 3 meals a day and 2  snacks -Look into joining a social group at the [facility] Teaching Method Utilized:  Visual Auditory Hands on  Barriers to learning/adherence to lifestyle change: despondent   Demonstrated degree of understanding via:  Teach Back   Monitoring/Evaluation:  Dietary intake, exercise,and body weight prn.

## 2017-12-20 ENCOUNTER — Encounter: Payer: PPO | Admitting: *Deleted

## 2017-12-20 ENCOUNTER — Telehealth: Payer: Self-pay

## 2017-12-20 NOTE — Telephone Encounter (Signed)
Patient called said he was having trouble sending a transmission. The error code 5704 was on the monitor. I gave pt tech support number so [employer] can help him trouble shoot his monitor.

## 2017-12-20 NOTE — Telephone Encounter (Signed)
I spoke with patient and [employer] is sending him a new monitor in 7 to 10 days.

## 2017-12-22 ENCOUNTER — Encounter: Payer: Self-pay | Admitting: Cardiology

## 2017-12-27 DIAGNOSIS — K3532 Acute appendicitis with perforation and localized peritonitis, without abscess: Secondary | ICD-10-CM | POA: Diagnosis not present

## 2017-12-27 DIAGNOSIS — N184 Chronic kidney disease, stage 4 (severe): Secondary | ICD-10-CM | POA: Diagnosis not present

## 2017-12-27 DIAGNOSIS — K8020 Calculus of gallbladder without cholecystitis without obstruction: Secondary | ICD-10-CM | POA: Diagnosis not present

## 2017-12-27 DIAGNOSIS — R600 Localized edema: Secondary | ICD-10-CM | POA: Diagnosis not present

## 2017-12-27 DIAGNOSIS — I129 Hypertensive chronic kidney disease with stage 1 through stage 4 chronic kidney disease, or unspecified chronic kidney disease: Secondary | ICD-10-CM | POA: Diagnosis not present

## 2017-12-27 DIAGNOSIS — N179 Acute kidney failure, unspecified: Secondary | ICD-10-CM | POA: Diagnosis not present

## 2017-12-27 LAB — HEPATIC FUNCTION PANEL
ALT: 13 (ref 10–40)
AST: 11 — AB (ref 14–40)
Alkaline Phosphatase: 83 (ref 25–125)
Bilirubin, Total: 0.2

## 2017-12-27 LAB — CBC AND DIFFERENTIAL
HEMATOCRIT: 30 — AB (ref 41–53)
HEMOGLOBIN: 9.9 — AB (ref 13.5–17.5)
Neutrophils Absolute: 6
PLATELETS: 181 (ref 150–399)
WBC: 9.3

## 2017-12-27 LAB — BASIC METABOLIC PANEL
BUN: 32 — AB (ref 4–21)
Creatinine: 3.2 — AB (ref 0.6–1.3)
Glucose: 94
Potassium: 5.1 (ref 3.4–5.3)
Sodium: 139 (ref 137–147)

## 2017-12-31 ENCOUNTER — Telehealth: Payer: Self-pay | Admitting: Cardiology

## 2017-12-31 NOTE — Telephone Encounter (Signed)
Received records from [facility] on 12/31/17, Appt 03/11/18 @ 3:00PM.NV

## 2018-01-05 ENCOUNTER — Encounter: Payer: Self-pay | Admitting: Family Medicine

## 2018-01-12 ENCOUNTER — Encounter: Payer: Self-pay | Admitting: Sports Medicine

## 2018-01-12 ENCOUNTER — Ambulatory Visit (INDEPENDENT_AMBULATORY_CARE_PROVIDER_SITE_OTHER): Payer: PPO | Admitting: Sports Medicine

## 2018-01-12 VITALS — BP 130/70 | HR 70 | Ht 67.0 in | Wt 221.2 lb

## 2018-01-12 DIAGNOSIS — N184 Chronic kidney disease, stage 4 (severe): Secondary | ICD-10-CM

## 2018-01-12 DIAGNOSIS — I5022 Chronic systolic (congestive) heart failure: Secondary | ICD-10-CM | POA: Diagnosis not present

## 2018-01-12 DIAGNOSIS — M65332 Trigger finger, left middle finger: Secondary | ICD-10-CM

## 2018-01-12 DIAGNOSIS — M79645 Pain in left finger(s): Secondary | ICD-10-CM | POA: Diagnosis not present

## 2018-01-12 DIAGNOSIS — M65331 Trigger finger, right middle finger: Secondary | ICD-10-CM

## 2018-01-12 DIAGNOSIS — E1122 Type 2 diabetes mellitus with diabetic chronic kidney disease: Secondary | ICD-10-CM | POA: Diagnosis not present

## 2018-01-12 NOTE — Progress Notes (Signed)
Rodney Farley, Rodney Farley  Rodney Farley - 84 y.o. male MRN [id number]  Date of birth: [birth year]  Visit Date: 01/12/2018  PCP: [name], MD   Referred by: [name], MD  Scribe(s) for today's visit: Wendy Poet, LAT, ATC  SUBJECTIVE:  Rodney Farley is here for Follow-up (B trigger fingers) .    01/12/2018: Compared to the last office visit, his previously described B middle finger pain symptoms are worsening.  Pt states that he is having pain, locking and loss of dexterity. Current symptoms are severe at times when his middle fingers catch & are nonradiating He has been taking Gabapentin.  He's also had several injections into his middle fingers and doesn't feel that they have been particularly helpful.    REVIEW OF SYSTEMS: Denies night time disturbances. Denies fevers, chills, or night sweats. Denies unexplained weight loss. Denies personal history of cancer. Denies changes in bowel or bladder habits. Reports recent unreported falls.  [address] last Tuesday when he got up to go to the bathroom in the morning. Reports new or worsening dyspnea or wheezing. Reports headaches or dizziness.   Yes to dizziness. Reports numbness, tingling or weakness  In the extremities - in B feet Reports dizziness or presyncopal episodes Reports lower extremity edema    HISTORY & PERTINENT PRIOR DATA:  Prior History reviewed and updated per electronic medical record.  Significant/pertinent history, findings, studies include:  reports that he quit smoking about 10 years ago. His smoking use included cigarettes. He has a 81.00 pack-year smoking history. He has never used smokeless tobacco. Recent Labs    01/28/17 1328 06/18/17 1555 10/07/17 0952  HGBA1C 8.2* 5.6 5.6   No specialty comments available. No problems updated.  OBJECTIVE:  VS:  HT:5\' 7"  (170.2 cm)   WT:221 lb 3.2 oz (100.3  kg)  BMI:34.64    BP:130/70  HR:70bpm  TEMP: ( )  RESP:95 %   PHYSICAL EXAM: Constitutional: WDWN, Non-toxic appearing. Psychiatric: Alert & appropriately interactive.  Not depressed or anxious appearing. Respiratory: No increased work of breathing.  Trachea Midline Eyes: Pupils are equal.  EOM intact without nystagmus.  No scleral icterus  Vascular Exam: warm to touch no edema  upper extremity neuro exam: unremarkable  MSK Exam: Left hand has a slight flexor contracture of the third finger.  He has significant pain with passive extension.  Marked pain over the A1 pulley.  There is no appreciable triggering this is because he is unable to fully flex and extend the finger. Right hand has triggering and pain over the A1 pulley but this is mild compared to the left.   ASSESSMENT & PLAN:   1. Trigger middle finger of left hand   2. Trigger middle finger of right hand   3. Pain of left middle finger   4. Controlled type 2 diabetes mellitus with stage 4 chronic kidney disease, unspecified whether long term insulin use ([place])   5. Chronic kidney disease (CKD), stage IV (severe) ([place])   6. Chronic systolic CHF (congestive heart failure) (HCC)     PLAN: Patient is failed serial injections of the left A1 flexor tendon sheath injection last one was over a year ago.  He reports only 2 to 3 days of improvement with each of these injections.  This time surgical intervention given the flexor contracture is recommended.  He does have a fairly significant history of cardiac disease as  well as kidney disease and will likely need to undergo this procedure with local anesthetic.  He is not interested in anything that would constitute general anesthesia.  He does report having significant limitations in his day-to-day function and may end up requiring some assistive devices for toileting given the difficulty he is currently having with his left hand.  He will need to discuss this preoperatively and may  require occupational therapy evaluation preoperatively for use of assistive devices.  Will defer to Ortho hand for their recommendations going forward..  Follow-up: Return if symptoms worsen or fail to improve, for as needed for ongoing issues.      Please see additional documentation for Objective, Assessment and Plan sections. Pertinent additional documentation may be included in corresponding procedure notes, imaging studies, problem based documentation and patient instructions. Please see these sections of the encounter for additional information regarding this visit.  CMA/ATC served as Education administrator during this visit. History, Physical, and Plan performed by medical provider. Documentation and orders reviewed and attested to.      [name], [facility] Physician

## 2018-01-26 ENCOUNTER — Encounter: Payer: Self-pay | Admitting: Sports Medicine

## 2018-02-01 ENCOUNTER — Encounter: Payer: Self-pay | Admitting: Family Medicine

## 2018-02-02 ENCOUNTER — Other Ambulatory Visit: Payer: Self-pay

## 2018-02-02 MED ORDER — GABAPENTIN 100 MG PO CAPS: 200.0000 mg | ORAL_CAPSULE | Freq: Every day | ORAL | 1 refills | Status: DC

## 2018-02-03 ENCOUNTER — Encounter: Payer: Self-pay | Admitting: Cardiology

## 2018-02-03 DIAGNOSIS — K8020 Calculus of gallbladder without cholecystitis without obstruction: Secondary | ICD-10-CM | POA: Diagnosis not present

## 2018-02-03 DIAGNOSIS — I129 Hypertensive chronic kidney disease with stage 1 through stage 4 chronic kidney disease, or unspecified chronic kidney disease: Secondary | ICD-10-CM | POA: Diagnosis not present

## 2018-02-03 DIAGNOSIS — K3532 Acute appendicitis with perforation and localized peritonitis, without abscess: Secondary | ICD-10-CM | POA: Diagnosis not present

## 2018-02-03 DIAGNOSIS — R600 Localized edema: Secondary | ICD-10-CM | POA: Diagnosis not present

## 2018-02-03 DIAGNOSIS — N184 Chronic kidney disease, stage 4 (severe): Secondary | ICD-10-CM | POA: Diagnosis not present

## 2018-02-03 DIAGNOSIS — N2581 Secondary hyperparathyroidism of renal origin: Secondary | ICD-10-CM | POA: Diagnosis not present

## 2018-02-03 DIAGNOSIS — N179 Acute kidney failure, unspecified: Secondary | ICD-10-CM | POA: Diagnosis not present

## 2018-02-03 LAB — CBC AND DIFFERENTIAL
HCT: 30 — AB (ref 41–53)
Hemoglobin: 10 — AB (ref 13.5–17.5)
PLATELETS: 227 (ref 150–399)
WBC: 8.5

## 2018-02-03 LAB — BASIC METABOLIC PANEL
BUN: 38 — AB (ref 4–21)
CREATININE: 4.1 — AB (ref 0.6–1.3)
Glucose: 141
Potassium: 5.5 — AB (ref 3.4–5.3)
SODIUM: 141 (ref 137–147)

## 2018-02-06 ENCOUNTER — Encounter: Payer: Self-pay | Admitting: Family Medicine

## 2018-02-07 DIAGNOSIS — H35372 Puckering of macula, left eye: Secondary | ICD-10-CM | POA: Diagnosis not present

## 2018-02-07 DIAGNOSIS — H16212 Exposure keratoconjunctivitis, left eye: Secondary | ICD-10-CM | POA: Diagnosis not present

## 2018-02-07 DIAGNOSIS — H02104 Unspecified ectropion of left upper eyelid: Secondary | ICD-10-CM | POA: Diagnosis not present

## 2018-02-07 DIAGNOSIS — H2511 Age-related nuclear cataract, right eye: Secondary | ICD-10-CM | POA: Diagnosis not present

## 2018-02-07 DIAGNOSIS — Z961 Presence of intraocular lens: Secondary | ICD-10-CM | POA: Diagnosis not present

## 2018-02-07 DIAGNOSIS — H04123 Dry eye syndrome of bilateral lacrimal glands: Secondary | ICD-10-CM | POA: Diagnosis not present

## 2018-02-07 DIAGNOSIS — E113293 Type 2 diabetes mellitus with mild nonproliferative diabetic retinopathy without macular edema, bilateral: Secondary | ICD-10-CM | POA: Diagnosis not present

## 2018-02-07 LAB — HM DIABETES EYE EXAM

## 2018-02-08 ENCOUNTER — Encounter: Payer: Self-pay | Admitting: Family Medicine

## 2018-02-10 DIAGNOSIS — M65332 Trigger finger, left middle finger: Secondary | ICD-10-CM | POA: Diagnosis not present

## 2018-02-10 DIAGNOSIS — M65331 Trigger finger, right middle finger: Secondary | ICD-10-CM | POA: Diagnosis not present

## 2018-02-16 ENCOUNTER — Encounter: Payer: Self-pay | Admitting: Physician Assistant

## 2018-02-17 ENCOUNTER — Telehealth: Payer: Self-pay | Admitting: *Deleted

## 2018-02-17 NOTE — Telephone Encounter (Signed)
   [facility] Pre-operative Risk Assessment    Request for surgical clearance:  1. What type of surgery is being performed? Release  A-1 Pulley Left Long Finger  2. When is this surgery scheduled? Pending Clearance  3. What type of clearance is required (medical clearance vs. Pharmacy clearance to hold med vs. Both)? Both  4. Are there any medications that need to be held prior to surgery and how long? Pending Provider Advisory   5. Practice name and name of physician performing surgery? [facility], Dr. Charlotte Crumb  6. What is your office phone number [phone]   7.   What is your office fax number [phone]  ATTN: Cyndee Brightly , RN  8.   Anesthesia type (None, local, MAC, general) ? Local   Keianna Signer 02/17/2018, 5:42 PM  _________________________________________________________________   (provider comments below)

## 2018-02-18 ENCOUNTER — Encounter: Payer: Self-pay | Admitting: Family Medicine

## 2018-02-18 NOTE — Telephone Encounter (Signed)
Spoke with requesting office I have notified them of pt's upcoming appointment with cardiology.

## 2018-02-18 NOTE — Telephone Encounter (Signed)
   Primary Cardiologist:Mehtaab [name], MD  Chart reviewed as part of pre-operative protocol coverage. Even though this is a very low risk procedure, since the patient is already scheduled to see Dr. [name] later this month, will have Dr. [name] address Preop clearance.  Pre-op covering staff: - Pt is already scheduled to See Dr. [name] on 8/30.  - Please contact requesting surgeon's office via preferred method (i.e, phone, fax) to inform them of need for appointment prior to surgery. -Please add note on schedule for perop clearance.   Daune Perch, NP  02/18/2018, 9:41 AM

## 2018-03-06 NOTE — Progress Notes (Signed)
CARDIOLOGY OFFICE NOTE  Date:  03/06/2018    Rodney Farley Date of Birth: [birth year] Medical Record #[id number]  PCP:  [name], MD  Cardiologist:  Raymound [name]  MD  No chief complaint on file.   History of Present Illness: Rodney Farley is a 84 y.o. male who is seen for follow up CAD and CHF. He also needs pre op clearance for hand surgery.  He has a history of CAD (s/p MI in 1994/1995 while in [place] s/p questionable PCI, , CHB 2010 s/p pacemaker, CKD stage IV, DM, HTN, HLD, remote tobacco abuse, COPD, moderate sleep apnea, and chronic systolic CHF.    In 2016 he had progression of DOE, chest discomfort and chest pounding with severe limitation on exertion. 2D Echo 09/2014 showed EF 35-40% with +WMA, grade 1 DD, mild MR. PFTs looked remarkably good. He underwent cardiac cath on 03/28/15 and Cr was 2.14 . LE duplex negative for DVT. Cath showed severe 3V CAD - the mid LAD, second diagonal, and third OM branches appeared suitable for PCI +/- distal LCx. The other option would be CABG. After discussion concerning risk and benefits hewas treated with PCI.   Patient underwent Multivessel PCI including stenting of the mid LAD, second diagonal, and third OM with DES on 04/09/15.  Distal 70% circumflex lesion  treated medically.He was started on aspirin and Plavix. Hydralazine was added for afterload reduction and better blood pressure control.   After his procedure he  had a marked improvement in his symptoms of dyspnea.  No chest pain. Breathing was much better. He had repeat Echo that still showed depressed EF 25-30% despite revascularization. He underwent upgrade of PPM to a BiV ICD device by Dr. Caryl Comes on 09/23/15. Follow up Echo in July 2017 showed normalization of EF.     He had a prolonged hospitalization from 9/14-9/25/2018 when he presented to the hospital with right lower quadrant abdominal pain and found to have a ruptured appendicitis with abscess. Gen.  Surgery was consulted and performed exploratory laparotomy with ileocecectomy on 03/27/2017. During the hospitalization, he also had acute on chronic renal insufficiency with peak creatinine of 5.75. However subsequent creatinine came down to 2.44. Followed by nephrology with reported stable renal function with GFR in 20s.    He underwent cholecystectomy on 10/13/2017.  Postoperative course was complicated by elevated troponin involving neck and shoulder pain.  Troponin peaked at the 2.14.  It was felt is likely demand ischemia associated with surgery.  Repeat echocardiogram obtained on 10/15/2017 showed EF 60 to 65%, paradoxical ventricular motion, mild MR.  Myoview obtained on the following day was intermediate risk was a large defect of severe severity present in the basal anteroseptal, mid anterior septal and apex location, this is large and a partially reversible defect concerning for mild to moderate ischemia.  Options was discussed with both the patient and his son, who preferred medical approach more than cardiac catheterization at this time.  Plavix was not started at this time due to his nosebleed and anemia. He was seen in follow up later in April with increased LE edema. Had run out of lasix for several days. Was started back on lasix 40 mg bid with compression hose and elevation of feet. Close follow up with [facility] recommended. Last creatinine there in July was 4.05. Seen by Dr. Florene Glen today and creatinine was down to 3.3.   On follow up today he is doing well. Denies any dyspnea, chest pain or  increased edema. According to his caretaker his swelling is much better than before. He is wearing support hose. He is planning on having minor surgery by Dr. Burney Gauze for trigger finger under local anesthesia.   Past Medical History:  Diagnosis Date  . AICD (automatic cardioverter/defibrillator) present   . AKI (acute kidney injury) ([place]) 03/27/2017  . Anemia   . Anginal pain ([place])   . Anxiety   . Arm  pain 05/08/2015   LEFT ARM  . Atrioventricular block, complete ([place])    a. 2010 s/p pacemaker.  . Bell's palsy    left side. after shingles episode  . Bilateral renal cysts 07/23/2017   Simple and hemorrhagic noted on CT ab/pelvis   . BPH associated with nocturia   . Cardiomegaly   . Chronic systolic CHF (congestive heart failure) ([place])    a. LVEF 35-40% by echo 09/2014.  [name] CKD (chronic kidney disease), stage IV ([place])   . COPD (chronic obstructive pulmonary disease) ([facility])    Severe  . Coronary artery disease    a. s/p MI in 1994/1995 while in [place] s/p questionable PCI. 03/2015: progression of disease, for staged PCI.  [name] Depression   . Diabetic peripheral neuropathy ([place])   . Diarrhea   . Diverticulitis   . DOE (dyspnea on exertion) 03/28/2015  . Gallstones   . GERD (gastroesophageal reflux disease)   . Gout   . Hard of hearing   . History of chronic pancreatitis 07/23/2017   noted on CT abd/pelvis  . History of shingles   . Hypercholesterolemia   . Hypertension   . Ischemic cardiomyopathy   . MI (myocardial infarction) ([place]) 1994; 1995  . Neuropathy    IN LOWER EXTREMITIES  . Nosebleed 10/06/2017   for 2 months most recent 10/06/2017  . Obesity   . Pacemaker    medtronic>>> MDT ICD 09/23/15  . Ruptured appendicitis   . Sleep apnea    "sleeps w/humidifyer when he panics and gets short of breath" (04/08/2015)  . TIA (transient ischemic attack) X 3  . Trigger middle finger of left hand   . Type II diabetes mellitus ([place])     Past Surgical History:  Procedure Laterality Date  . CARDIAC CATHETERIZATION N/A 03/29/2015   Procedure: Right/Left Heart Cath and Coronary Angiography;  Surgeon: Claude M [name], MD;  Location: [facility];  Service: Cardiovascular;  Laterality: N/A;  . [place]   "after my MI; put me on heart RX after cath"  . CARDIAC CATHETERIZATION N/A 04/09/2015   Procedure: Coronary Stent Intervention;  Surgeon: Alando M [name], MD;   Location: [facility];  Service: Cardiovascular;  Laterality: N/A;  . CHOLECYSTECTOMY N/A 10/13/2017   Procedure: LAPAROSCOPIC CHOLECYSTECTOMY WITH LYSIS OF ADHESIONS;  Surgeon: Ileana Roup, MD;  Location: [facility];  Service: General;  Laterality: N/A;  . COLONOSCOPY    . DENTAL SURGERY    . EP IMPLANTABLE DEVICE N/A 09/23/2015   MDT CRT-D, Dr. Caryl Comes  . HIATAL HERNIA REPAIR  1977  . ILEOCECETOMY N/A 03/27/2017   Procedure: ILEOCECECTOMY;  Surgeon: Ileana Roup, MD;  Location: [facility];  Service: General;  Laterality: N/A;  . INSERT / REPLACE / REMOVE PACEMAKER  07/2008   Complete heart block status post DDD with good function  . LAPAROTOMY N/A 03/27/2017   Procedure: EXPLORATORY LAPAROTOMY;  Surgeon: Ileana Roup, MD;  Location: [facility];  Service: General;  Laterality: N/A;  . TONSILLECTOMY    .  UPPER GI ENDOSCOPY       Medications: Current Outpatient Medications  Medication Sig Dispense Refill  . albuterol (PROVENTIL HFA;VENTOLIN HFA) 108 (90 Base) MCG/ACT inhaler Inhale 2 puffs into the lungs every 6 (six) hours as needed for wheezing or shortness of breath. 1 Inhaler 2  . aspirin EC 81 MG tablet Take 81 mg by mouth daily.    [name] atorvastatin (LIPITOR) 40 MG tablet Take 1 tablet (40 mg total) by mouth daily. 90 tablet 3  . Blood Glucose Monitoring Suppl (FREESTYLE LITE) DEVI     . carvedilol (COREG) 6.25 MG tablet Take 1 tablet (6.25 mg total) by mouth 2 (two) times daily with a meal. 180 tablet 3  . furosemide (LASIX) 40 MG tablet Take 1 tablet (40 mg total) by mouth 2 (two) times daily. 60 tablet 6  . gabapentin (NEURONTIN) 100 MG capsule Take 2 capsules (200 mg total) by mouth at bedtime. 180 capsule 1  . glucose blood (FREESTYLE TEST STRIPS) test strip Use to check blood sugar daily 100 each 3  . glucose monitoring kit (FREESTYLE) monitoring kit USE TO MONITOR BLOOD GLUCOSE AS DIRECTED 1 each 1  . Insulin Glargine (LANTUS SOLOSTAR) 100 UNIT/ML Solostar Pen Inject  60 Units into the skin daily at 10 pm. (Patient taking differently: Inject 40 Units into the skin daily at 10 pm. ) 5 pen PRN  . Insulin Pen Needle (UNIFINE PENTIPS) 31G X 5 MM MISC USE AS DIRECTED 100 each 0  . isosorbide mononitrate (IMDUR) 60 MG 24 hr tablet Take 1 tablet (60 mg total) by mouth daily. 30 tablet 6  . nitroGLYCERIN (NITROSTAT) 0.4 MG SL tablet Place 1 tablet (0.4 mg total) under the tongue every 5 (five) minutes as needed for chest pain. 2 doses before calling for emergent help 25 tablet 3  . oxymetazoline (AFRIN) 0.05 % nasal spray Place 3 sprays into both nostrils 2 (two) times daily as needed (nose bleed). 30 mL 0  . pantoprazole (PROTONIX) 40 MG tablet TAKE 1 TABLET BY MOUTH ONCE DAILY (Patient taking differently: TAKE 1 TABLET (40 MG) BY MOUTH ONCE DAILY) 90 tablet 3  . polyethylene glycol (MIRALAX / GLYCOLAX) packet Take 17 g by mouth daily. 14 each 0  . Polyethylene Glycol 400 (BLINK TEARS) 0.25 % SOLN Place 1 drop into both eyes 3 (three) times daily.    . sertraline (ZOLOFT) 100 MG tablet Take 1.5 tablets (150 mg total) by mouth daily. 135 tablet 1  . tamsulosin (FLOMAX) 0.4 MG CAPS capsule TAKE 1 CAPSULE BY MOUTH DAILY AFTER SUPPER (Patient taking differently: Take 0.4 mg by mouth daily after supper. ) 90 capsule 3  . traMADol (ULTRAM) 50 MG tablet Take 1 tablet (50 mg total) by mouth every 6 (six) hours as needed (pain not controlled with tylenol). 30 tablet 0  . triamcinolone cream (KENALOG) 0.1 % APPLY CREAM EXTERNALLY TWICE DAILY FOR 7 TO 10 DAYS MAXMIUM 80 g 0  . vitamin B-12 (CYANOCOBALAMIN) 1000 MCG tablet Take 1,000 mcg by mouth daily.     No current facility-administered medications for this visit.     Allergies: Allergies  Allergen Reactions  . Bee Venom Anaphylaxis  . Lyrica [Pregabalin] Other (See Comments)    hallucinations  . Prednisone Other (See Comments)    hallucinations  . Zocor [Simvastatin] Nausea Only and Other (See Comments)    Headache  with brand name only.  Can take the generic.    Social History: The patient  reports that  he quit smoking about 10 years ago. His smoking use included cigarettes. He has a 81.00 pack-year smoking history. He has never used smokeless tobacco. He reports that he drinks alcohol. He reports that he does not use drugs.   Family History: [name] family history includes Heart attack in his brother; Leukemia in his father; Stroke in his mother and sister.   Review of Systems: Please see the history of present illness.   Otherwise, the review of systems is positive for none.   All other systems are reviewed and negative.   Physical Exam: VS:  There were no vitals taken for this visit. [name]  BMI There is no height or weight on file to calculate BMI.  Wt Readings from Last 3 Encounters:  01/12/18 221 lb 3.2 oz (100.3 kg)  11/25/17 210 lb 4 oz (95.4 kg)  11/09/17 216 lb 9.6 oz (98.2 kg)    GENERAL:  Well appearing elderly WM in NAD HEENT:  PERRL, EOMI, sclera are clear. Oropharynx is clear. NECK:  No jugular venous distention, carotid upstroke brisk and symmetric, no bruits, no thyromegaly or adenopathy LUNGS:  Clear to auscultation bilaterally CHEST:  Unremarkable HEART:  RRR,  PMI not displaced or sustained,S1 and S2 within normal limits, no S3, no S4: no clicks, no rubs, no murmurs ABD:  Soft, nontender. BS +, no masses or bruits. Midline lower abdominal surgical wound has healed well.  EXT:  2 + pulses throughout, tr edema, no cyanosis no clubbing SKIN:  Warm and dry.  No rashes NEURO:  Alert and oriented x 3. Cranial nerves II through XII intact. PSYCH:  Cognitively intact     LABORATORY DATA:  EKG:  EKG is not ordered today.  Lab Results  Component Value Date   WBC 8.5 02/03/2018   HGB 10.0 (A) 02/03/2018   HCT 30 (A) 02/03/2018   PLT 227 02/03/2018   GLUCOSE 138 (H) 11/23/2017   CHOL 171 06/26/2015   TRIG 317 (H) 06/26/2015   HDL 43 06/26/2015   LDLCALC 65 06/26/2015     ALT 13 12/27/2017   AST 11 (A) 12/27/2017   NA 141 02/03/2018   K 5.5 (A) 02/03/2018   CL 111 (H) 11/23/2017   CREATININE 4.1 (A) 02/03/2018   BUN 38 (A) 02/03/2018   CO2 19 (L) 11/23/2017   TSH 1.41 01/28/2017   PSA 0.55 09/19/2014   INR 0.97 10/13/2017   HGBA1C 5.6 10/07/2017   MICROALBUR 68.3 (H) 09/19/2014    BNP (last 3 results) No results for input(s): BNP in the last 8760 hours.  ProBNP (last 3 results) No results for input(s): PROBNP in the last 8760 hours.   Other Studies Reviewed Today:  Echo Study Conclusions from 01/23/16:  Study Conclusions  - Left ventricle: The cavity size was normal. Wall thickness was   increased in a pattern of mild LVH. Systolic function was normal.   The estimated ejection fraction was in the range of 60% to 65%.   Doppler parameters are consistent with abnormal left ventricular   relaxation (grade 1 diastolic dysfunction).  Echo 10/15/17: Study Conclusions  - Left ventricle: The cavity size was normal. There was severe   concentric hypertrophy. Systolic function was normal. The   estimated ejection fraction was in the range of 60% to 65%. Wall   motion was normal; there were no regional wall motion   abnormalities. The study was not technically sufficient to allow   evaluation of LV diastolic dysfunction due to atrial  fibrillation. Doppler parameters are consistent with high   ventricular filling pressure. - Ventricular septum: Septal motion showed moderate paradox. These   changes are consistent with right ventricular pacing. - Aortic valve: Trileaflet; mildly thickened, moderately calcified   leaflets. - Mitral valve: There was mild regurgitation. - Left atrium: The atrium was mildly dilated.  Assessment/Plan: 1. CAD - s/p  Cardiac catheterization in 2016 revealing Mid LAD lesion, 90% stenosed, 2nd Diag lesion, 90% stenosed, 3rd Mrg lesion, 90% stenosed. He received drug-eluting stents to each lesion in September 2016.  Distal 70% circumflex lesion is treated medically. He is asymptomatic.  He did have mild troponin elevation post cholecystectomy - medical management recommended. He is not having any current dyspnea or chest pain. Continue ASA, Imdur, Coreg and statin  2. ICM EF 25-30%. S/p BiV ICD. Normal device function in December 2018. Echo in July 2017 and April 2019  showed normalization of EF. Due for ICD check but he needs son to be present to work the machinery.  3. Chronic systolic HF -  He is on optimal therapy with Coreg, diuretics,  and nitrates. Not a candidate for ACEi/ARB  due to CKD.   4. HTN - controlled  5. HLD - on statin therapy with good LDL control.   6. S/p cholecystectomy  7. CKD stage IV. Followed by Nephrology  8. Trigger finger. OK to proceed with local anesthesia.   Current medicines are reviewed with the patient today.  The patient does not have concerns regarding medicines other than what has been noted above.   Disposition:   FU with Dr. [name] 3 months.   Patient is agreeable to this plan and will call if any problems develop in the interim.   Signed: Ayaz [name] MD, [facility]    03/06/2018 10:20 AM

## 2018-03-07 DIAGNOSIS — N184 Chronic kidney disease, stage 4 (severe): Secondary | ICD-10-CM | POA: Diagnosis not present

## 2018-03-11 ENCOUNTER — Encounter: Payer: Self-pay | Admitting: Cardiology

## 2018-03-11 ENCOUNTER — Ambulatory Visit: Payer: PPO | Admitting: Cardiology

## 2018-03-11 VITALS — BP 123/54 | HR 83 | Wt 213.4 lb

## 2018-03-11 DIAGNOSIS — I10 Essential (primary) hypertension: Secondary | ICD-10-CM | POA: Diagnosis not present

## 2018-03-11 DIAGNOSIS — I5022 Chronic systolic (congestive) heart failure: Secondary | ICD-10-CM

## 2018-03-11 DIAGNOSIS — I2510 Atherosclerotic heart disease of native coronary artery without angina pectoris: Secondary | ICD-10-CM

## 2018-03-11 DIAGNOSIS — K8020 Calculus of gallbladder without cholecystitis without obstruction: Secondary | ICD-10-CM | POA: Diagnosis not present

## 2018-03-11 DIAGNOSIS — I129 Hypertensive chronic kidney disease with stage 1 through stage 4 chronic kidney disease, or unspecified chronic kidney disease: Secondary | ICD-10-CM | POA: Diagnosis not present

## 2018-03-11 DIAGNOSIS — N184 Chronic kidney disease, stage 4 (severe): Secondary | ICD-10-CM | POA: Diagnosis not present

## 2018-03-11 DIAGNOSIS — N179 Acute kidney failure, unspecified: Secondary | ICD-10-CM | POA: Diagnosis not present

## 2018-03-11 DIAGNOSIS — Z95810 Presence of automatic (implantable) cardiac defibrillator: Secondary | ICD-10-CM | POA: Diagnosis not present

## 2018-03-11 DIAGNOSIS — N2581 Secondary hyperparathyroidism of renal origin: Secondary | ICD-10-CM | POA: Diagnosis not present

## 2018-03-11 DIAGNOSIS — R600 Localized edema: Secondary | ICD-10-CM | POA: Diagnosis not present

## 2018-03-11 DIAGNOSIS — E875 Hyperkalemia: Secondary | ICD-10-CM | POA: Diagnosis not present

## 2018-03-11 DIAGNOSIS — K3532 Acute appendicitis with perforation and localized peritonitis, without abscess: Secondary | ICD-10-CM | POA: Diagnosis not present

## 2018-03-11 DIAGNOSIS — Z23 Encounter for immunization: Secondary | ICD-10-CM | POA: Diagnosis not present

## 2018-03-11 LAB — BASIC METABOLIC PANEL
BUN: 62 — AB (ref 4–21)
CREATININE: 3.5 — AB (ref 0.6–1.3)
Potassium: 6.2 — AB (ref 3.4–5.3)
SODIUM: 139 (ref 137–147)

## 2018-03-11 LAB — CBC AND DIFFERENTIAL
HCT: 31 — AB (ref 41–53)
Hemoglobin: 10.3 — AB (ref 13.5–17.5)
Platelets: 219 (ref 150–399)
WBC: 10

## 2018-03-11 NOTE — Patient Instructions (Addendum)
Continue your current therapy  You may proceed with hand surgery.  I will follow you 3 months

## 2018-03-15 DIAGNOSIS — E875 Hyperkalemia: Secondary | ICD-10-CM | POA: Diagnosis not present

## 2018-03-18 ENCOUNTER — Ambulatory Visit (INDEPENDENT_AMBULATORY_CARE_PROVIDER_SITE_OTHER): Payer: PPO | Admitting: *Deleted

## 2018-03-18 ENCOUNTER — Telehealth: Payer: Self-pay | Admitting: Cardiology

## 2018-03-18 DIAGNOSIS — I442 Atrioventricular block, complete: Secondary | ICD-10-CM

## 2018-03-18 NOTE — Telephone Encounter (Signed)
Received records from [facility] on 03/17/18, Appt 06/08/18 @ 4:20PM. NV

## 2018-03-21 ENCOUNTER — Encounter: Payer: Self-pay | Admitting: Cardiology

## 2018-03-21 ENCOUNTER — Encounter: Payer: Self-pay | Admitting: Family Medicine

## 2018-03-21 NOTE — Progress Notes (Signed)
Remote ICD transmission.   

## 2018-03-23 ENCOUNTER — Encounter: Payer: Self-pay | Admitting: Family Medicine

## 2018-03-23 NOTE — Telephone Encounter (Signed)
I have called patient he has had same pain that started post op. He has had evaluated by other providers but does not think he has talked to you about it. His pain is never over a 5/10. Can be dull to sharp in side under ribs. She has most pain when he is on his left side in bed or had to take a very deep breath every now ant then. He does not have any new symptoms and they are not bad just on going. I have offered to get him in before his oct app with you but he declined. Informed that if he has any new symptoms or changes in the current symptoms that he should call office or go to ED.   Is their any further instructions/informtion for patient?

## 2018-04-12 LAB — CUP PACEART REMOTE DEVICE CHECK
Brady Statistic AP VP Percent: 85.81 %
Brady Statistic AP VS Percent: 0.02 %
Brady Statistic AS VP Percent: 14.16 %
Brady Statistic AS VS Percent: 0.02 %
Brady Statistic RA Percent Paced: 85.24 %
Date Time Interrogation Session: 20190906165230
HighPow Impedance: 66 Ohm
Implantable Lead Implant Date: 20170313
Implantable Lead Location: 753860
Implantable Lead Model: 5076
Implantable Pulse Generator Implant Date: 20170313
Lead Channel Impedance Value: 361 Ohm
Lead Channel Impedance Value: 361 Ohm
Lead Channel Impedance Value: 399 Ohm
Lead Channel Impedance Value: 418 Ohm
Lead Channel Impedance Value: 475 Ohm
Lead Channel Impedance Value: 513 Ohm
Lead Channel Impedance Value: 646 Ohm
Lead Channel Impedance Value: 665 Ohm
Lead Channel Impedance Value: 722 Ohm
Lead Channel Impedance Value: 760 Ohm
Lead Channel Pacing Threshold Amplitude: 1 V
Lead Channel Pacing Threshold Amplitude: 1.375 V
Lead Channel Pacing Threshold Pulse Width: 0.4 ms
Lead Channel Pacing Threshold Pulse Width: 0.4 ms
Lead Channel Pacing Threshold Pulse Width: 0.4 ms
Lead Channel Sensing Intrinsic Amplitude: 13.5 mV
Lead Channel Setting Pacing Amplitude: 2.5 V
Lead Channel Setting Pacing Amplitude: 3.5 V
Lead Channel Setting Pacing Pulse Width: 0.4 ms
Lead Channel Setting Sensing Sensitivity: 0.3 mV
MDC IDC LEAD IMPLANT DT: 20100112
MDC IDC LEAD IMPLANT DT: 20170313
MDC IDC LEAD LOCATION: 753858
MDC IDC LEAD LOCATION: 753859
MDC IDC MSMT BATTERY REMAINING LONGEVITY: 26 mo
MDC IDC MSMT BATTERY VOLTAGE: 2.94 V
MDC IDC MSMT LEADCHNL LV IMPEDANCE VALUE: 418 Ohm
MDC IDC MSMT LEADCHNL LV IMPEDANCE VALUE: 418 Ohm
MDC IDC MSMT LEADCHNL LV IMPEDANCE VALUE: 722 Ohm
MDC IDC MSMT LEADCHNL RA SENSING INTR AMPL: 1.625 mV
MDC IDC MSMT LEADCHNL RA SENSING INTR AMPL: 1.625 mV
MDC IDC MSMT LEADCHNL RV PACING THRESHOLD AMPLITUDE: 0.5 V
MDC IDC MSMT LEADCHNL RV SENSING INTR AMPL: 13.5 mV
MDC IDC SET LEADCHNL LV PACING AMPLITUDE: 2.5 V
MDC IDC SET LEADCHNL RV PACING PULSEWIDTH: 0.4 ms
MDC IDC STAT BRADY RV PERCENT PACED: 99.88 %

## 2018-04-13 ENCOUNTER — Ambulatory Visit: Payer: PPO | Admitting: Physician Assistant

## 2018-04-13 ENCOUNTER — Encounter: Payer: Self-pay | Admitting: Family Medicine

## 2018-04-13 ENCOUNTER — Ambulatory Visit: Payer: PPO | Admitting: Family Medicine

## 2018-04-13 VITALS — BP 130/80 | HR 72 | Temp 98.3°F | Ht 67.0 in | Wt 207.8 lb

## 2018-04-13 DIAGNOSIS — F331 Major depressive disorder, recurrent, moderate: Secondary | ICD-10-CM

## 2018-04-13 DIAGNOSIS — Z794 Long term (current) use of insulin: Secondary | ICD-10-CM

## 2018-04-13 DIAGNOSIS — I5022 Chronic systolic (congestive) heart failure: Secondary | ICD-10-CM

## 2018-04-13 DIAGNOSIS — N184 Chronic kidney disease, stage 4 (severe): Secondary | ICD-10-CM

## 2018-04-13 DIAGNOSIS — R413 Other amnesia: Secondary | ICD-10-CM | POA: Diagnosis not present

## 2018-04-13 DIAGNOSIS — E1122 Type 2 diabetes mellitus with diabetic chronic kidney disease: Secondary | ICD-10-CM | POA: Diagnosis not present

## 2018-04-13 DIAGNOSIS — E785 Hyperlipidemia, unspecified: Secondary | ICD-10-CM | POA: Diagnosis not present

## 2018-04-13 DIAGNOSIS — E1169 Type 2 diabetes mellitus with other specified complication: Secondary | ICD-10-CM | POA: Insufficient documentation

## 2018-04-13 LAB — POCT GLYCOSYLATED HEMOGLOBIN (HGB A1C): Hemoglobin A1C: 5.3 % (ref 4.0–5.6)

## 2018-04-13 LAB — COMPREHENSIVE METABOLIC PANEL
ALT: 16 U/L (ref 0–53)
AST: 14 U/L (ref 0–37)
Albumin: 3.8 g/dL (ref 3.5–5.2)
Alkaline Phosphatase: 76 U/L (ref 39–117)
BUN: 51 mg/dL — AB (ref 6–23)
CO2: 21 mEq/L (ref 19–32)
CREATININE: 3.86 mg/dL — AB (ref 0.40–1.50)
Calcium: 8.6 mg/dL (ref 8.4–10.5)
Chloride: 111 mEq/L (ref 96–112)
GFR: 15.91 mL/min — ABNORMAL LOW (ref >60.00)
GLUCOSE: 109 mg/dL — AB (ref 70–99)
Potassium: 4.7 mEq/L (ref 3.5–5.1)
SODIUM: 142 meq/L (ref 135–145)
Total Bilirubin: 0.3 mg/dL (ref 0.2–1.2)
Total Protein: 6.2 g/dL (ref 6.0–8.3)

## 2018-04-13 LAB — TSH: TSH: 1.24 u[IU]/mL (ref 0.35–4.50)

## 2018-04-13 LAB — VITAMIN B12: Vitamin B-12: 483 pg/mL (ref 211–911)

## 2018-04-13 LAB — CBC
HCT: 30.9 % — ABNORMAL LOW (ref 39.0–52.0)
Hemoglobin: 10.6 g/dL — ABNORMAL LOW (ref 13.0–17.0)
MCHC: 34.4 g/dL (ref 30.0–36.0)
MCV: 92.4 fl (ref 78.0–100.0)
Platelets: 192 10*3/uL (ref 150.0–400.0)
RBC: 3.34 Mil/uL — ABNORMAL LOW (ref 4.22–5.81)
RDW: 15 % (ref 11.5–15.5)
WBC: 8.2 10*3/uL (ref 4.0–10.5)

## 2018-04-13 LAB — LIPID PANEL
Cholesterol: 167 mg/dL (ref 0–200)
HDL: 33 mg/dL — ABNORMAL LOW (ref >39.00)
NONHDL: 134.26
Total CHOL/HDL Ratio: 5
Triglycerides: 316 mg/dL — ABNORMAL HIGH (ref 0.0–149.0)
VLDL: 63.2 mg/dL — ABNORMAL HIGH (ref 0.0–40.0)

## 2018-04-13 LAB — LDL CHOLESTEROL, DIRECT: Direct LDL: 60 mg/dL

## 2018-04-13 LAB — MICROALBUMIN / CREATININE URINE RATIO
CREATININE, U: 78.8 mg/dL
MICROALB UR: 180.8 mg/dL — AB (ref 0.0–1.9)
Microalb Creat Ratio: 229.4 mg/g — ABNORMAL HIGH (ref 0.0–30.0)

## 2018-04-13 NOTE — Assessment & Plan Note (Signed)
S: Patient remains on his Lasix- states taking 80 mg once a day.  No increased edema.  Weight is actually down 9 pounds from last visit here but has improved diet A/P:   Continue current rx

## 2018-04-13 NOTE — Assessment & Plan Note (Signed)
S: hopefully controlled on atorvastatin 40mg   A/P: has been some time since lipid panel- will update today

## 2018-04-13 NOTE — Assessment & Plan Note (Signed)
S: controlled on Lantus now at 20units CBGs- 110-125 in AMs. Denies lows. Down 9 lbs from eating better Lab Results  Component Value Date   HGBA1C 5.3 04/13/2018   HGBA1C 5.6 10/07/2017   HGBA1C 5.6 06/18/2017   A/P: reduce lantus to 18 units with how good a1c is at 5.3

## 2018-04-13 NOTE — Assessment & Plan Note (Signed)
S: Known CKD stage IV.  Patient has been referred to [facility] for education and balance and CKD 4, diabetes diets.  He has been following with Dr. 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  We had referred him to palliative care due to desire to eat what he wants but also desire to live for a long time  Recently started on calcitriol by Dr. Florene Glen A/P:  Continue current rx- update bmet

## 2018-04-13 NOTE — Progress Notes (Deleted)
Rodney Farley is a 84 y.o. male here for Nutrition Consult.  I acted as a Education administrator for [employer], PA-C [employer], LPN  History of Present Illness:   No chief complaint on file.   HPI  Patient is here to discuss Nutrition and Diet  Dietary recall: Wakes up at *** Breakfast *** Lunch *** Dinner *** Snacks *** Beverages  ***  Weight: Wt Readings from Last 3 Encounters:  03/11/18 213 lb 6.4 oz (96.8 kg)  01/12/18 221 lb 3.2 oz (100.3 kg)  11/25/17 210 lb 4 oz (95.4 kg)    Exercise: ***  Support system: ***  Sleep: ***  Goals: 1- *** 2- *** 3- ***  Estimated daily energy needs: Calories: *** kcal Protein: *** g Fluid: *** ml  Past Medical History:  Diagnosis Date  . AICD (automatic cardioverter/defibrillator) present   . AKI (acute kidney injury) ([place]) 03/27/2017  . Anemia   . Anginal pain ([place])   . Anxiety   . Arm pain 05/08/2015   LEFT ARM  . Atrioventricular block, complete ([place])    a. 2010 s/p pacemaker.  . Bell's palsy    left side. after shingles episode  . Bilateral renal cysts 07/23/2017   Simple and hemorrhagic noted on CT ab/pelvis   . BPH associated with nocturia   . Cardiomegaly   . Chronic systolic CHF (congestive heart failure) ([place])    a. LVEF 35-40% by echo 09/2014.  [name] CKD (chronic kidney disease), stage IV ([place])   . COPD (chronic obstructive pulmonary disease) (HCC)    Severe  . Coronary artery disease    a. s/p MI in 1994/1995 while in [place] s/p questionable PCI. 03/2015: progression of disease, for staged PCI.  [name] Depression   . Diabetic peripheral neuropathy ([place])   . Diarrhea   . Diverticulitis   . DOE (dyspnea on exertion) 03/28/2015  . Gallstones   . GERD (gastroesophageal reflux disease)   . Gout   . Hard of hearing   . History of chronic pancreatitis 07/23/2017   noted on CT abd/pelvis  . History of shingles   . Hypercholesterolemia   . Hypertension   . Ischemic cardiomyopathy   . MI (myocardial  infarction) ([place]) 1994; 1995  . Neuropathy    IN LOWER EXTREMITIES  . Nosebleed 10/06/2017   for 2 months most recent 10/06/2017  . Obesity   . Pacemaker    medtronic>>> MDT ICD 09/23/15  . Ruptured appendicitis   . Sleep apnea    "sleeps w/humidifyer when he panics and gets short of breath" (04/08/2015)  . TIA (transient ischemic attack) X 3  . Trigger middle finger of left hand   . Type II diabetes mellitus ([place])      Social History   Socioeconomic History  . Marital status: Married    Spouse name: Not on file  . Number of children: 1  . Years of education: college  . Highest education level: Not on file  Occupational History  . Occupation: Retired  Scientific laboratory technician  . Financial resource strain: Not on file  . Food insecurity:    Worry: Not on file    Inability: Not on file  . Transportation needs:    Medical: Not on file    Non-medical: Not on file  Tobacco Use  . Smoking status: Former Smoker    Packs/day: 1.50    Years: 54.00    Pack years: 81.00    Types: Cigarettes    Last attempt to  quit: 07/18/2007    Years since quitting: 10.7  . Smokeless tobacco: Never Used  . Tobacco comment: did not discuss LdCT but being evaluated for chole currently  Substance and Sexual Activity  . Alcohol use: Yes    Comment: 04/08/2015 "probably 3 drinks/month"  . Drug use: No  . Sexual activity: Never  Lifestyle  . Physical activity:    Days per week: Not on file    Minutes per session: Not on file  . Stress: Not on file  Relationships  . Social connections:    Talks on phone: Not on file    Gets together: Not on file    Attends religious service: Not on file    Active member of club or organization: Not on file    Attends meetings of clubs or organizations: Not on file    Relationship status: Not on file  . Intimate partner violence:    Fear of current or ex partner: Not on file    Emotionally abused: Not on file    Physically abused: Not on file    Forced sexual activity:  Not on file  Other Topics Concern  . Not on file  Social History Narrative   Married 1994 (together since 1989) 1 son, 1 stepson. 3 grandkids, 6 great grandkids      Retired from [employer]. Had 2 years of collge.       Faith: Mormon    Past Surgical History:  Procedure Laterality Date  . CARDIAC CATHETERIZATION N/A 03/29/2015   Procedure: Right/Left Heart Cath and Coronary Angiography;  Surgeon: Saben M [name], MD;  Location: [facility];  Service: Cardiovascular;  Laterality: N/A;  . [place]   "after my MI; put me on heart RX after cath"  . CARDIAC CATHETERIZATION N/A 04/09/2015   Procedure: Coronary Stent Intervention;  Surgeon: Mamie M [name], MD;  Location: [facility];  Service: Cardiovascular;  Laterality: N/A;  . CHOLECYSTECTOMY N/A 10/13/2017   Procedure: LAPAROSCOPIC CHOLECYSTECTOMY WITH LYSIS OF ADHESIONS;  Surgeon: Ileana Roup, MD;  Location: [facility];  Service: General;  Laterality: N/A;  . COLONOSCOPY    . DENTAL SURGERY    . EP IMPLANTABLE DEVICE N/A 09/23/2015   MDT CRT-D, Dr. Caryl Comes  . HIATAL HERNIA REPAIR  1977  . ILEOCECETOMY N/A 03/27/2017   Procedure: ILEOCECECTOMY;  Surgeon: Ileana Roup, MD;  Location: [facility];  Service: General;  Laterality: N/A;  . INSERT / REPLACE / REMOVE PACEMAKER  07/2008   Complete heart block status post DDD with good function  . LAPAROTOMY N/A 03/27/2017   Procedure: EXPLORATORY LAPAROTOMY;  Surgeon: Ileana Roup, MD;  Location: [facility];  Service: General;  Laterality: N/A;  . TONSILLECTOMY    . UPPER GI ENDOSCOPY      Family History  Problem Relation Age of Onset  . Stroke Mother   . Leukemia Father   . Stroke Sister   . Heart attack Brother     Allergies  Allergen Reactions  . Bee Venom Anaphylaxis  . Lyrica [Pregabalin] Other (See Comments)    hallucinations  . Prednisone Other (See Comments)    hallucinations  . Zocor [Simvastatin] Nausea Only and Other (See  Comments)    Headache with brand name only.  Can take the generic.    Current Medications:   Current Outpatient Medications:  .  albuterol (PROVENTIL HFA;VENTOLIN HFA) 108 (90 Base) MCG/ACT inhaler, Inhale 2 puffs into the lungs every 6 (  six) hours as needed for wheezing or shortness of breath., Disp: 1 Inhaler, Rfl: 2 .  aspirin EC 81 MG tablet, Take 81 mg by mouth daily., Disp: , Rfl:  .  atorvastatin (LIPITOR) 40 MG tablet, Take 1 tablet (40 mg total) by mouth daily., Disp: 90 tablet, Rfl: 3 .  Blood Glucose Monitoring Suppl (FREESTYLE LITE) DEVI, , Disp: , Rfl:  .  carvedilol (COREG) 6.25 MG tablet, Take 1 tablet (6.25 mg total) by mouth 2 (two) times daily with a meal., Disp: 180 tablet, Rfl: 3 .  furosemide (LASIX) 40 MG tablet, Take 1 tablet (40 mg total) by mouth 2 (two) times daily., Disp: 60 tablet, Rfl: 6 .  gabapentin (NEURONTIN) 100 MG capsule, Take 2 capsules (200 mg total) by mouth at bedtime., Disp: 180 capsule, Rfl: 1 .  glucose blood (FREESTYLE TEST STRIPS) test strip, Use to check blood sugar daily, Disp: 100 each, Rfl: 3 .  glucose monitoring kit (FREESTYLE) monitoring kit, USE TO MONITOR BLOOD GLUCOSE AS DIRECTED, Disp: 1 each, Rfl: 1 .  Insulin Glargine (LANTUS SOLOSTAR) 100 UNIT/ML Solostar Pen, Inject 60 Units into the skin daily at 10 pm. (Patient taking differently: Inject 40 Units into the skin daily at 10 pm. ), Disp: 5 pen, Rfl: PRN .  Insulin Glargine (LANTUS SOLOSTAR) 100 UNIT/ML Solostar Pen, INJECT 20 UNITS UNDER THE SKIN ONCE DAILY, Disp: , Rfl:  .  Insulin Pen Needle (UNIFINE PENTIPS) 31G X 5 MM MISC, USE AS DIRECTED, Disp: 100 each, Rfl: 0 .  isosorbide mononitrate (IMDUR) 60 MG 24 hr tablet, Take 1 tablet (60 mg total) by mouth daily., Disp: 30 tablet, Rfl: 6 .  nitroGLYCERIN (NITROSTAT) 0.4 MG SL tablet, Place 1 tablet (0.4 mg total) under the tongue every 5 (five) minutes as needed for chest pain. 2 doses before calling for emergent help, Disp: 25 tablet,  Rfl: 3 .  oxymetazoline (AFRIN) 0.05 % nasal spray, Place 3 sprays into both nostrils 2 (two) times daily as needed (nose bleed)., Disp: 30 mL, Rfl: 0 .  polyethylene glycol (MIRALAX / GLYCOLAX) packet, Take 17 g by mouth daily., Disp: 14 each, Rfl: 0 .  Polyethylene Glycol 400 (BLINK TEARS) 0.25 % SOLN, Place 1 drop into both eyes 3 (three) times daily., Disp: , Rfl:  .  sertraline (ZOLOFT) 100 MG tablet, Take 1.5 tablets (150 mg total) by mouth daily., Disp: 135 tablet, Rfl: 1 .  tamsulosin (FLOMAX) 0.4 MG CAPS capsule, TAKE 1 CAPSULE BY MOUTH DAILY AFTER SUPPER (Patient taking differently: Take 0.4 mg by mouth daily after supper. ), Disp: 90 capsule, Rfl: 3 .  triamcinolone cream (KENALOG) 0.1 %, APPLY CREAM EXTERNALLY TWICE DAILY FOR 7 TO 10 DAYS MAXMIUM, Disp: 80 g, Rfl: 0 .  vitamin B-12 (CYANOCOBALAMIN) 1000 MCG tablet, Take 1,000 mcg by mouth daily., Disp: , Rfl:    Review of Systems:   ROS  Vitals:   There were no vitals filed for this visit.   There is no height or weight on file to calculate BMI.  Physical Exam:   Physical Exam  Assessment and Plan:    There are no diagnoses linked to this encounter.  . Reviewed expectations re: course of current medical issues. . Discussed self-management of symptoms. . Outlined signs and symptoms indicating need for more acute intervention. . Patient verbalized understanding and all questions were answered. . See orders for this visit as documented in the electronic medical record. . Patient received an After-Visit Summary.  CMA  or LPN served as Education administrator during this visit. History, Physical, and Plan performed by medical provider. Documentation and orders reviewed and attested to.  Inda Coke, PA-C

## 2018-04-13 NOTE — Patient Instructions (Addendum)
Team- Need full phq9. Also want you to help him sign up for another wellness visit in 2020- so his MMSE can be followed due to memory issues.   reduce lantus to 18 units with how good a1c is at 5.3  Please stop by lab before you go

## 2018-04-13 NOTE — Assessment & Plan Note (Signed)
S: poorly controlled on zoloft 150mg . Patient states he did not realize he was feeling as down as he is until he reflected on current state. He feels proud that he had improved on his diabetes and blood pressure- he thinks he can do the same with this- he is interested in counselign Depression screen PHQ 2/9 04/13/2018  Decreased Interest 3  Down, Depressed, Hopeless 2  PHQ - 2 Score 5  Altered sleeping 3  Tired, decreased energy 3  Change in appetite 2  Feeling bad or failure about yourself  2  Trouble concentrating 2  Moving slowly or fidgety/restless 0  Suicidal thoughts 0  PHQ-9 Score 17  Difficult doing work/chores Somewhat difficult  Some recent data might be hidden  A/P: continue zoloft 150mg  daily. Add in CBT with [facility]. Recheck 6 weeks to see how he is doing and repeat phq9. No SI- he is to seek care immediately if any thoughts of self harm.

## 2018-04-13 NOTE — Progress Notes (Addendum)
Subjective:  Rodney Farley is a 84 y.o. year old very pleasant male patient who presents for/with See problem oriented charting ROS- No chest pain or shortness of breath. No headache or blurry vision.  Some memory issues at times   Past Medical History-  Patient Active Problem List   Diagnosis Date Noted  . Ruptured appendicitis 03/26/2017    Priority: High  . Marital stress 02/18/2017    Priority: High  . COPD (chronic obstructive pulmonary disease) ([place])     Priority: High  . Chronic systolic CHF (congestive heart failure) ([place]) 04/08/2015    Priority: High  . DOE (dyspnea on exertion), due to significant CAD 03/28/2015    Priority: High  . Chronic kidney disease (CKD), stage IV (severe) ([place]) 03/04/2015    Priority: High  . Coronary artery disease involving native coronary artery of native heart with angina pectoris ([place]) 09/13/2014    Priority: High  . Depression, major, recurrent, moderate ([place]) 08/05/2012    Priority: High  . Controlled type 2 diabetes mellitus with stage 4 chronic kidney disease ([place]) 08/05/2012    Priority: High  . Ischemic cardiomyopathy 11/03/2011    Priority: High  . Atrioventricular block, complete (HCC)     Priority: High  . Pacemaker     Priority: High  . Hyperlipidemia 04/13/2018    Priority: Medium  . BPH associated with nocturia 04/27/2016    Priority: Medium  . Diabetic peripheral neuropathy (HCC)     Priority: Medium  . Bell's palsy     Priority: Medium  . Gout 06/26/2015    Priority: Medium  . Sleep apnea 08/05/2012    Priority: Medium  . HTN (hypertension) 08/05/2012    Priority: Medium  . Diverticulitis 04/08/2015    Priority: Low  . GERD (gastroesophageal reflux disease) 08/07/2012    Priority: Low  . Obesity     Priority: Low  . Elevated troponin 10/15/2017  . S/P cholecystectomy 10/13/2017  . Symptomatic cholelithiasis 10/13/2017  . Epistaxis 10/13/2017  . Left shoulder pain 10/13/2017  . Diarrhea 05/11/2017  .  Trigger finger 06/10/2016  . Angina pectoris ([place]) 10/16/2014  . Depression 08/05/2012    Medications- reviewed and updated Current Outpatient Medications  Medication Sig Dispense Refill  . albuterol (PROVENTIL HFA;VENTOLIN HFA) 108 (90 Base) MCG/ACT inhaler Inhale 2 puffs into the lungs every 6 (six) hours as needed for wheezing or shortness of breath. 1 Inhaler 2  . aspirin EC 81 MG tablet Take 81 mg by mouth daily.    [name] atorvastatin (LIPITOR) 40 MG tablet Take 1 tablet (40 mg total) by mouth daily. 90 tablet 3  . Blood Glucose Monitoring Suppl (FREESTYLE LITE) DEVI     . calcitRIOL (ROCALTROL) 0.25 MCG capsule Take 1 capsule by mouth daily.    . carvedilol (COREG) 6.25 MG tablet Take 1 tablet (6.25 mg total) by mouth 2 (two) times daily with a meal. 180 tablet 3  . furosemide (LASIX) 40 MG tablet Take 1 tablet (40 mg total) by mouth 2 (two) times daily. 60 tablet 6  . gabapentin (NEURONTIN) 100 MG capsule Take 2 capsules (200 mg total) by mouth at bedtime. 180 capsule 1  . glucose blood (FREESTYLE TEST STRIPS) test strip Use to check blood sugar daily 100 each 3  . glucose monitoring kit (FREESTYLE) monitoring kit USE TO MONITOR BLOOD GLUCOSE AS DIRECTED 1 each 1  . Insulin Glargine (LANTUS SOLOSTAR) 100 UNIT/ML Solostar Pen Inject 60 Units into the skin daily at 10  pm. (Patient taking differently: Inject 20 Units into the skin daily at 10 pm. ) 5 pen PRN  . Insulin Glargine (LANTUS SOLOSTAR) 100 UNIT/ML Solostar Pen INJECT 20 UNITS UNDER THE SKIN ONCE DAILY    . Insulin Pen Needle (UNIFINE PENTIPS) 31G X 5 MM MISC USE AS DIRECTED 100 each 0  . isosorbide mononitrate (IMDUR) 60 MG 24 hr tablet Take 1 tablet (60 mg total) by mouth daily. 30 tablet 6  . nitroGLYCERIN (NITROSTAT) 0.4 MG SL tablet Place 1 tablet (0.4 mg total) under the tongue every 5 (five) minutes as needed for chest pain. 2 doses before calling for emergent help 25 tablet 3  . oxymetazoline (AFRIN) 0.05 % nasal spray Place 3  sprays into both nostrils 2 (two) times daily as needed (nose bleed). 30 mL 0  . polyethylene glycol (MIRALAX / GLYCOLAX) packet Take 17 g by mouth daily. 14 each 0  . Polyethylene Glycol 400 (BLINK TEARS) 0.25 % SOLN Place 1 drop into both eyes 3 (three) times daily.    . sertraline (ZOLOFT) 100 MG tablet Take 1.5 tablets (150 mg total) by mouth daily. 135 tablet 1  . tamsulosin (FLOMAX) 0.4 MG CAPS capsule TAKE 1 CAPSULE BY MOUTH DAILY AFTER SUPPER (Patient taking differently: Take 0.4 mg by mouth daily after supper. ) 90 capsule 3  . triamcinolone cream (KENALOG) 0.1 % APPLY CREAM EXTERNALLY TWICE DAILY FOR 7 TO 10 DAYS MAXMIUM 80 g 0  . vitamin B-12 (CYANOCOBALAMIN) 1000 MCG tablet Take 1,000 mcg by mouth daily.     No current facility-administered medications for this visit.     Objective: BP 130/80 (BP Location: Left Arm, Patient Position: Sitting, Cuff Size: Normal)   Pulse 72   Temp 98.3 F (36.8 C) (Oral)   Ht _0  (1.702 m)   Wt 207 lb 12.8 oz (94.3 kg)   SpO2 98%   BMI 32.55 kg/m  Gen: NAD, resting comfortably CV: RRR no murmurs rubs or gallops Lungs: CTAB no crackles, wheeze, rhonchi Abdomen: soft/nontender/nondistended/normal bowel sounds. No rebound or guarding.  Ext: no edema Skin: warm, dry  Assessment/Plan:  Other notes: 1.memory loss- having some forget fullness- opening fridge instead of dishwasher, found himself with one shoe going to mailbox, couldn't get radio to work- didn't turn volume up. Will add rpr, hiv, b12 to labs. Advised awv with MMSE next january 2. Seeing Dr. Burney Gauze for trigger fingers  RUQ pain S: Patient with continued issues with pain in right upper abdomen.  This pain is been going on since the time of his surgery.  Right after the surgery the pain was 1 out of 10.  More recently it is remained at 0-1 out of 10 mainly at night lying on left side- worse with yawning- up to 5/10. Doesn't wake him up from pain A/P: may be scar tissue related-  he feels pain is improving and wants to hold off on seeing his surgeon again  Controlled type 2 diabetes mellitus with stage 4 chronic kidney disease ([place]) S: controlled on Lantus now at 20units CBGs- 110-125 in AMs. Denies lows. Down 9 lbs from eating better Lab Results  Component Value Date   HGBA1C 5.3 04/13/2018   HGBA1C 5.6 10/07/2017   HGBA1C 5.6 06/18/2017   A/P: reduce lantus to 18 units with how good a1c is at 5.3  Chronic systolic CHF (congestive heart failure) ([place]) S: Patient remains on his Lasix- states taking 80 mg once a day.  No increased edema.  Weight is actually down 9 pounds from last visit here but has improved diet A/P:   Continue current rx  Chronic kidney disease (CKD), stage IV (severe) (HCC) S: Known CKD stage IV.  Patient has been referred to Inda Coke for education and balance and CKD 4, diabetes diets.  He has been following with Dr. 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  We had referred him to palliative care due to desire to eat what he wants but also desire to live for a long time  Recently started on calcitriol by Dr. Florene Glen A/P:  Continue current rx- update bmet  Hyperlipidemia S: hopefully controlled on atorvastatin 76m  A/P: has been some time since lipid panel- will update today  Depression, major, recurrent, moderate ([place] S: poorly controlled on zoloft 1529m Patient states he did not realize he was feeling as down as he is until he reflected on current state. He feels proud that he had improved on his diabetes and blood pressure- he thinks he can do the same with this- he is interested in counselign Depression screen PHQ 2/9 04/13/2018  Decreased Interest 3  Down, Depressed, Hopeless 2  PHQ - 2 Score 5  Altered sleeping 3  Tired, decreased energy 3  Change in appetite 2  Feeling bad or failure about yourself  2  Trouble concentrating 2  Moving slowly or fidgety/restless 0   Suicidal thoughts 0  PHQ-9 Score 17  Difficult doing work/chores Somewhat difficult  Some recent data might be hidden  A/P: continue zoloft 1509maily. Add in CBT with [facility]. Recheck 6 weeks to see how he is doing and repeat phq9. No SI- he is to seek care immediately if any thoughts of self harm.     Future Appointments  Date Time Provider DepHartford0/10/2017  1:00 PM WorInda CokeA [facility]/28/2019 12:00 PM KleDeboraha SprangD [facility]  06/01/2018 11:30 AM HunMarin OlpD [facility]  06/08/2018  4:20 PM JorMartiniqueeter M, MD [facility]2/03/2018  7:50 AM [facility] DEVICE REMOTES [facility]   Return in about 4 months (around 08/14/2018) for follow up- or sooner if needed.  Lab/Order associations: Controlled type 2 diabetes mellitus with stage 4 chronic kidney disease, with long-term current use of insulin (HCCFrank Plan: POCT glycosylated hemoglobin (Hb A1C), CBC, Comprehensive metabolic panel, Lipid panel, Microalbumin / creatinine urine ratio, CANCELED: Hemoglobin A1c  Memory loss - Plan: Vitamin B12, RPR, HIV Antibody (routine testing w rflx), TSH  Chronic systolic CHF (congestive heart failure) (HCC)  Chronic kidney disease (CKD), stage IV (severe) (HCC)  Hyperlipidemia, unspecified hyperlipidemia type  Depression, major, recurrent, moderate (HCCHoytsvilleReturn precautions advised.  SteGarret ReddishD

## 2018-04-14 ENCOUNTER — Encounter: Payer: Self-pay | Admitting: Family Medicine

## 2018-04-14 LAB — RPR: RPR Ser Ql: NONREACTIVE

## 2018-04-14 LAB — HIV ANTIBODY (ROUTINE TESTING W REFLEX): HIV: NONREACTIVE

## 2018-04-15 ENCOUNTER — Ambulatory Visit (INDEPENDENT_AMBULATORY_CARE_PROVIDER_SITE_OTHER): Payer: PPO | Admitting: Physician Assistant

## 2018-04-15 ENCOUNTER — Encounter: Payer: Self-pay | Admitting: Physician Assistant

## 2018-04-15 VITALS — BP 120/70 | HR 72 | Temp 98.1°F | Ht 67.0 in | Wt 206.0 lb

## 2018-04-15 DIAGNOSIS — Z713 Dietary counseling and surveillance: Secondary | ICD-10-CM | POA: Diagnosis not present

## 2018-04-15 DIAGNOSIS — N184 Chronic kidney disease, stage 4 (severe): Secondary | ICD-10-CM | POA: Diagnosis not present

## 2018-04-15 NOTE — Patient Instructions (Addendum)
It was great to see you!  A few recommendations from today's visit: 1. Find a substitute for orange powder drink. 2. Choose a different fruit other than oranges 3. Ask the pharmacist if you can eat grapefruit with your medications. 4. Limit tomatoes and tomato-containing products.  Please follow-up with me after you have your next follow-up with the kidney doctors.

## 2018-04-15 NOTE — Progress Notes (Signed)
Rodney Farley is a 84 y.o. male here for a Nutrition Consult.  I acted as a Education administrator for [employer], PA-C Anselmo Pickler, LPN  History of Present Illness:   Chief Complaint  Patient presents with  . Nutrition Counseling    HPI  Patient is here to discuss Diet and Nutrition  Has significantly cut down on his milk consumption since he has last saw me.  Dietary recall: Wakes up at 6-8am Breakfast -- 1 boiled egg with some of the yolks removed and slice of toast (white); orange juice from powder Lunch -- cheese and tomato sandwich; grapefruit and orange segments; orange juice from powder Dinner -- top ramen with vegetable (no seasoning) with low-sodium chicken stock and green veggies and carrots OR fish OR liver Snacks -- orange and grapefruit and tomatoes and grapes Beverages  Water and orange juice from powder  Weight: Wt Readings from Last 3 Encounters:  04/15/18 206 lb (93.4 kg)  04/13/18 207 lb 12.8 oz (94.3 kg)  03/11/18 213 lb 6.4 oz (96.8 kg)   Exercise: Limited  Support system: Physicians and son/DIL  Goals: 1- lower blood sugars 2- improve kidney function 3- enjoy foods  Estimated daily energy needs: Calories: 1600 - 1750 kcal Protein: 55-65 g Fluid: per renal MD  Past Medical History:  Diagnosis Date  . AICD (automatic cardioverter/defibrillator) present   . AKI (acute kidney injury) ([place]) 03/27/2017  . Anemia   . Anginal pain ([place])   . Anxiety   . Arm pain 05/08/2015   LEFT ARM  . Atrioventricular block, complete ([place])    a. 2010 s/p pacemaker.  . Bell's palsy    left side. after shingles episode  . Bilateral renal cysts 07/23/2017   Simple and hemorrhagic noted on CT ab/pelvis   . BPH associated with nocturia   . Cardiomegaly   . Chronic systolic CHF (congestive heart failure) ([place])    a. LVEF 35-40% by echo 09/2014.  [name] CKD (chronic kidney disease), stage IV ([place])   . COPD (chronic obstructive pulmonary disease) ([facility])    Severe   . Coronary artery disease    a. s/p MI in 1994/1995 while in [place] s/p questionable PCI. 03/2015: progression of disease, for staged PCI.  [name] Depression   . Diabetic peripheral neuropathy ([place])   . Diarrhea   . Diverticulitis   . DOE (dyspnea on exertion) 03/28/2015  . Gallstones   . GERD (gastroesophageal reflux disease)   . Gout   . Hard of hearing   . History of chronic pancreatitis 07/23/2017   noted on CT abd/pelvis  . History of shingles   . Hypercholesterolemia   . Hypertension   . Ischemic cardiomyopathy   . MI (myocardial infarction) ([place]) 1994; 1995  . Neuropathy    IN LOWER EXTREMITIES  . Nosebleed 10/06/2017   for 2 months most recent 10/06/2017  . Obesity   . Pacemaker    medtronic>>> MDT ICD 09/23/15  . Ruptured appendicitis   . Sleep apnea    "sleeps w/humidifyer when he panics and gets short of breath" (04/08/2015)  . TIA (transient ischemic attack) X 3  . Trigger middle finger of left hand   . Type II diabetes mellitus ([place])      Social History   Socioeconomic History  . Marital status: Married    Spouse name: Not on file  . Number of children: 1  . Years of education: college  . Highest education level: Not on file  Occupational History  .  Occupation: Retired  Scientific laboratory technician  . Financial resource strain: Not on file  . Food insecurity:    Worry: Not on file    Inability: Not on file  . Transportation needs:    Medical: Not on file    Non-medical: Not on file  Tobacco Use  . Smoking status: Former Smoker    Packs/day: 1.50    Years: 54.00    Pack years: 81.00    Types: Cigarettes    Last attempt to quit: 07/18/2007    Years since quitting: 10.7  . Smokeless tobacco: Never Used  . Tobacco comment: did not discuss LdCT but being evaluated for chole currently  Substance and Sexual Activity  . Alcohol use: Yes    Comment: 04/08/2015 "probably 3 drinks/month"  . Drug use: No  . Sexual activity: Never  Lifestyle  . Physical activity:    Days per  week: Not on file    Minutes per session: Not on file  . Stress: Not on file  Relationships  . Social connections:    Talks on phone: Not on file    Gets together: Not on file    Attends religious service: Not on file    Active member of club or organization: Not on file    Attends meetings of clubs or organizations: Not on file    Relationship status: Not on file  . Intimate partner violence:    Fear of current or ex partner: Not on file    Emotionally abused: Not on file    Physically abused: Not on file    Forced sexual activity: Not on file  Other Topics Concern  . Not on file  Social History Narrative   Married 1994 (together since 1989) 1 son, 1 stepson. 3 grandkids, 6 great grandkids      Retired from [employer]. Had 2 years of collge.       Faith: Mormon    Past Surgical History:  Procedure Laterality Date  . CARDIAC CATHETERIZATION N/A 03/29/2015   Procedure: Right/Left Heart Cath and Coronary Angiography;  Surgeon: Aarush M [name], MD;  Location: [facility];  Service: Cardiovascular;  Laterality: N/A;  . [place]   "after my MI; put me on heart RX after cath"  . CARDIAC CATHETERIZATION N/A 04/09/2015   Procedure: Coronary Stent Intervention;  Surgeon: Makhi M [name], MD;  Location: [facility];  Service: Cardiovascular;  Laterality: N/A;  . CHOLECYSTECTOMY N/A 10/13/2017   Procedure: LAPAROSCOPIC CHOLECYSTECTOMY WITH LYSIS OF ADHESIONS;  Surgeon: Ileana Roup, MD;  [facility];  Service: General;  Laterality: N/A;  . COLONOSCOPY    . DENTAL SURGERY    . EP IMPLANTABLE DEVICE N/A 09/23/2015   MDT CRT-D, Dr. Caryl Comes  . HIATAL HERNIA REPAIR  1977  . ILEOCECETOMY N/A 03/27/2017   Procedure: ILEOCECECTOMY;  Surgeon: Ileana Roup, MD;  Location: [facility];  Service: General;  Laterality: N/A;  . INSERT / REPLACE / REMOVE PACEMAKER  07/2008   Complete heart block status post DDD with good function  . LAPAROTOMY N/A  03/27/2017   Procedure: EXPLORATORY LAPAROTOMY;  Surgeon: Ileana Roup, MD;  Location: [facility];  Service: General;  Laterality: N/A;  . TONSILLECTOMY    . UPPER GI ENDOSCOPY      Family History  Problem Relation Age of Onset  . Stroke Mother   . Leukemia Father   . Stroke Sister   . Heart attack Brother  Allergies  Allergen Reactions  . Bee Venom Anaphylaxis  . Lyrica [Pregabalin] Other (See Comments)    hallucinations  . Prednisone Other (See Comments)    hallucinations  . Zocor [Simvastatin] Nausea Only and Other (See Comments)    Headache with brand name only.  Can take the generic.    Current Medications:   Current Outpatient Medications:  .  albuterol (PROVENTIL HFA;VENTOLIN HFA) 108 (90 Base) MCG/ACT inhaler, Inhale 2 puffs into the lungs every 6 (six) hours as needed for wheezing or shortness of breath., Disp: 1 Inhaler, Rfl: 2 .  aspirin EC 81 MG tablet, Take 81 mg by mouth daily., Disp: , Rfl:  .  atorvastatin (LIPITOR) 40 MG tablet, Take 1 tablet (40 mg total) by mouth daily., Disp: 90 tablet, Rfl: 3 .  Blood Glucose Monitoring Suppl (FREESTYLE LITE) DEVI, , Disp: , Rfl:  .  calcitRIOL (ROCALTROL) 0.25 MCG capsule, Take 1 capsule by mouth daily., Disp: , Rfl:  .  carvedilol (COREG) 6.25 MG tablet, Take 1 tablet (6.25 mg total) by mouth 2 (two) times daily with a meal., Disp: 180 tablet, Rfl: 3 .  furosemide (LASIX) 40 MG tablet, Take 1 tablet (40 mg total) by mouth 2 (two) times daily., Disp: 60 tablet, Rfl: 6 .  gabapentin (NEURONTIN) 100 MG capsule, Take 2 capsules (200 mg total) by mouth at bedtime., Disp: 180 capsule, Rfl: 1 .  glucose blood (FREESTYLE TEST STRIPS) test strip, Use to check blood sugar daily, Disp: 100 each, Rfl: 3 .  glucose monitoring kit (FREESTYLE) monitoring kit, USE TO MONITOR BLOOD GLUCOSE AS DIRECTED, Disp: 1 each, Rfl: 1 .  Insulin Glargine (LANTUS SOLOSTAR) 100 UNIT/ML Solostar Pen, Inject 60 Units into the skin daily at 10 pm.  (Patient taking differently: Inject 18 Units into the skin daily at 10 pm. ), Disp: 5 pen, Rfl: PRN .  Insulin Pen Needle (UNIFINE PENTIPS) 31G X 5 MM MISC, USE AS DIRECTED, Disp: 100 each, Rfl: 0 .  isosorbide mononitrate (IMDUR) 60 MG 24 hr tablet, Take 1 tablet (60 mg total) by mouth daily., Disp: 30 tablet, Rfl: 6 .  nitroGLYCERIN (NITROSTAT) 0.4 MG SL tablet, Place 1 tablet (0.4 mg total) under the tongue every 5 (five) minutes as needed for chest pain. 2 doses before calling for emergent help, Disp: 25 tablet, Rfl: 3 .  oxymetazoline (AFRIN) 0.05 % nasal spray, Place 3 sprays into both nostrils 2 (two) times daily as needed (nose bleed)., Disp: 30 mL, Rfl: 0 .  polyethylene glycol (MIRALAX / GLYCOLAX) packet, Take 17 g by mouth daily., Disp: 14 each, Rfl: 0 .  Polyethylene Glycol 400 (BLINK TEARS) 0.25 % SOLN, Place 1 drop into both eyes 3 (three) times daily., Disp: , Rfl:  .  sertraline (ZOLOFT) 100 MG tablet, Take 1.5 tablets (150 mg total) by mouth daily., Disp: 135 tablet, Rfl: 1 .  tamsulosin (FLOMAX) 0.4 MG CAPS capsule, TAKE 1 CAPSULE BY MOUTH DAILY AFTER SUPPER (Patient taking differently: Take 0.4 mg by mouth daily after supper. ), Disp: 90 capsule, Rfl: 3 .  triamcinolone cream (KENALOG) 0.1 %, APPLY CREAM EXTERNALLY TWICE DAILY FOR 7 TO 10 DAYS MAXMIUM, Disp: 80 g, Rfl: 0 .  vitamin B-12 (CYANOCOBALAMIN) 1000 MCG tablet, Take 1,000 mcg by mouth daily., Disp: , Rfl:  .  Insulin Glargine (LANTUS SOLOSTAR) 100 UNIT/ML Solostar Pen, INJECT 20 UNITS UNDER THE SKIN ONCE DAILY, Disp: , Rfl:    Review of Systems:   ROS  Negative unless  otherwise specified per HPI.  Vitals:   Vitals:   04/15/18 1305  BP: 120/70  Pulse: 72  Temp: 98.1 F (36.7 C)  TempSrc: Oral  SpO2: 97%  Weight: 206 lb (93.4 kg)  Height: 5' 7" (1.702 m)     Body mass index is 32.26 kg/m.  Physical Exam:   Physical Exam  Constitutional: He is oriented to person, place, and time. He appears well-developed  and well-nourished.  HENT:  Head: Normocephalic.  Eyes: Pupils are equal, round, and reactive to light. Conjunctivae and EOM are normal.  Neck: Normal range of motion.  Pulmonary/Chest: Effort normal.  Musculoskeletal: Normal range of motion.  Neurological: He is alert and oriented to person, place, and time.  Skin: Skin is warm and dry.  Psychiatric: He has a normal mood and affect. His behavior is normal. Judgment and thought content normal.  Nursing note and vitals reviewed.   Assessment and Plan:    Rodney Farley was seen today for nutrition counseling.  Diagnoses and all orders for this visit:  Encounter for nutritional counseling  Chronic kidney disease (CKD), stage IV (severe) ([place])   Discussed specific, individualized recommendations regarding nutrition including decreasing sugary beverages, limiting portions, balancing out meals, and eating regularly throughout the day.   Provided specific recommendations for patient and put in AVS. Congratulated on improving HgbA1c. Follow-up with [place] after seeing renal.   . Reviewed expectations re: course of current medical issues. . Discussed self-management of symptoms. . Outlined signs and symptoms indicating need for more acute intervention. . Patient verbalized understanding and all questions were answered. . See orders for this visit as documented in the electronic medical record. . Patient received an After-Visit Summary.  CMA or LPN served as scribe during this visit. History, Physical, and Plan performed by medical provider. Documentation and orders reviewed and attested to.  Inda Coke, PA-C

## 2018-04-18 DIAGNOSIS — M65332 Trigger finger, left middle finger: Secondary | ICD-10-CM | POA: Diagnosis not present

## 2018-04-25 ENCOUNTER — Other Ambulatory Visit: Payer: Self-pay | Admitting: Family Medicine

## 2018-04-25 DIAGNOSIS — F32A Depression, unspecified: Secondary | ICD-10-CM

## 2018-04-25 DIAGNOSIS — F329 Major depressive disorder, single episode, unspecified: Secondary | ICD-10-CM

## 2018-05-02 ENCOUNTER — Other Ambulatory Visit: Payer: Self-pay | Admitting: Family Medicine

## 2018-05-04 ENCOUNTER — Telehealth: Payer: Self-pay

## 2018-05-04 NOTE — Telephone Encounter (Signed)
VM left for patient to introduce Palliative Care and to schedule visit with NP. Call back information provided

## 2018-05-05 ENCOUNTER — Telehealth: Payer: Self-pay

## 2018-05-05 NOTE — Telephone Encounter (Signed)
Phone call placed to patient. Phone call answered but then call disconnected

## 2018-05-05 NOTE — Telephone Encounter (Signed)
VM left for son

## 2018-05-06 ENCOUNTER — Telehealth: Payer: Self-pay

## 2018-05-06 NOTE — Telephone Encounter (Signed)
Received return call from patient's daughter in law to schedule visit with Palliative Care. Visit scheduled for 05/10/18 @ 10 am

## 2018-05-09 ENCOUNTER — Encounter: Payer: Self-pay | Admitting: Internal Medicine

## 2018-05-09 ENCOUNTER — Ambulatory Visit (INDEPENDENT_AMBULATORY_CARE_PROVIDER_SITE_OTHER): Payer: PPO | Admitting: Internal Medicine

## 2018-05-09 VITALS — BP 154/80 | HR 72 | Ht 67.0 in | Wt 204.4 lb

## 2018-05-09 DIAGNOSIS — Z95810 Presence of automatic (implantable) cardiac defibrillator: Secondary | ICD-10-CM | POA: Diagnosis not present

## 2018-05-09 DIAGNOSIS — I5022 Chronic systolic (congestive) heart failure: Secondary | ICD-10-CM | POA: Diagnosis not present

## 2018-05-09 DIAGNOSIS — I255 Ischemic cardiomyopathy: Secondary | ICD-10-CM

## 2018-05-09 DIAGNOSIS — I442 Atrioventricular block, complete: Secondary | ICD-10-CM | POA: Diagnosis not present

## 2018-05-09 NOTE — Patient Instructions (Signed)
Medication Instructions:  Your physician recommends that you continue on your current medications as directed. Please refer to the Current Medication list given to you today.  Labwork: None ordered.  Testing/Procedures: None ordered.  Follow-Up: Your physician recommends that you schedule a follow-up appointment in:   One Year with Dr Caryl Comes  Remote monitoring is used to monitor your ICD from home. This monitoring reduces the number of office visits required to check your device to one time per year. It allows [place] to keep an eye on the functioning of your device to ensure it is working properly. You are scheduled for a device check from home on 06/20/2018. You may send your transmission at any time that day. If you have a wireless device, the transmission will be sent automatically. After your physician reviews your transmission, you will receive a postcard with your next transmission date.    Any Other Special Instructions Will Be Listed Below (If Applicable).     If you need a refill on your cardiac medications before your next appointment, please call your pharmacy.

## 2018-05-09 NOTE — Progress Notes (Signed)
Patient Care Team: [name], MD as PCP - General (Family Medicine) [name], Preet M, MD as PCP - Cardiology (Cardiology)   HPI  Rodney Farley is a 84 y.o. male Seen in follow-up for a pacemaker implanted in 2010 elsewhere. We saw him to establish in 2013. He has a history of complete heart block. He underwent CRT-D upgrade 3/17.  He has history of ischemic heart disease with prior MI.   Myoview 2002 demonstrated ejection fraction of 49% with prior MI but no ischemia.    DATE TEST EF   3/19 Echo  35-40%   12/16 Echo  25-30%   7/17 Echo   65 %   4/19 Echo   60-65 %   4/19 Myoview 45-50% Scar   Denies chest pain.  No edema but wears compression hose 24/7.  No nocturnal dyspnea or orthopnea.  Some shortness of breath with exertion typically when walking up his driveway.Linus Mako with a stick.  No medication questions.  Neuropathy treated with gabapentin much improved       Past Medical History:  Diagnosis Date  . AICD (automatic cardioverter/defibrillator) present   . AKI (acute kidney injury) ([place]) 03/27/2017  . Anemia   . Anginal pain ([place])   . Anxiety   . Arm pain 05/08/2015   LEFT ARM  . Atrioventricular block, complete ([place])    a. 2010 s/p pacemaker.  . Bell's palsy    left side. after shingles episode  . Bilateral renal cysts 07/23/2017   Simple and hemorrhagic noted on CT ab/pelvis   . BPH associated with nocturia   . Cardiomegaly   . Chronic systolic CHF (congestive heart failure) ([place])    a. LVEF 35-40% by echo 09/2014.  [name] CKD (chronic kidney disease), stage IV ([place])   . COPD (chronic obstructive pulmonary disease) ([facility])    Severe  . Coronary artery disease    a. s/p MI in 1994/1995 while in [place] s/p questionable PCI. 03/2015: progression of disease, for staged PCI.  [name] Depression   . Diabetic peripheral neuropathy ([place])   . Diarrhea   . Diverticulitis   . DOE (dyspnea on exertion) 03/28/2015  . Gallstones   . GERD  (gastroesophageal reflux disease)   . Gout   . Hard of hearing   . History of chronic pancreatitis 07/23/2017   noted on CT abd/pelvis  . History of shingles   . Hypercholesterolemia   . Hypertension   . Ischemic cardiomyopathy   . MI (myocardial infarction) ([place]) 1994; 1995  . Neuropathy    IN LOWER EXTREMITIES  . Nosebleed 10/06/2017   for 2 months most recent 10/06/2017  . Obesity   . Pacemaker    medtronic>>> MDT ICD 09/23/15  . Ruptured appendicitis   . Sleep apnea    "sleeps w/humidifyer when he panics and gets short of breath" (04/08/2015)  . TIA (transient ischemic attack) X 3  . Trigger middle finger of left hand   . Type II diabetes mellitus ([place])     Past Surgical History:  Procedure Laterality Date  . CARDIAC CATHETERIZATION N/A 03/29/2015   Procedure: Right/Left Heart Cath and Coronary Angiography;  Surgeon: Contrell M [name], MD;  Location: [facility];  Service: Cardiovascular;  Laterality: N/A;  . [place]   "after my MI; put me on heart RX after cath"  . CARDIAC CATHETERIZATION N/A 04/09/2015   Procedure: Coronary Stent Intervention;  Surgeon: Joeph M [name], MD;  Location: [facility];  Service: Cardiovascular;  Laterality: N/A;  . CHOLECYSTECTOMY N/A 10/13/2017   Procedure: LAPAROSCOPIC CHOLECYSTECTOMY WITH LYSIS OF ADHESIONS;  Surgeon: Ileana Roup, MD;  [facility];  Service: General;  Laterality: N/A;  . COLONOSCOPY    . DENTAL SURGERY    . EP IMPLANTABLE DEVICE N/A 09/23/2015   MDT CRT-D, Dr. Caryl Comes  . HIATAL HERNIA REPAIR  1977  . ILEOCECETOMY N/A 03/27/2017   Procedure: ILEOCECECTOMY;  Surgeon: Ileana Roup, MD;  Location: [facility];  Service: General;  Laterality: N/A;  . INSERT / REPLACE / REMOVE PACEMAKER  07/2008   Complete heart block status post DDD with good function  . LAPAROTOMY N/A 03/27/2017   Procedure: EXPLORATORY LAPAROTOMY;  Surgeon: Ileana Roup, MD;  Location: [facility];  Service: General;   Laterality: N/A;  . TONSILLECTOMY    . UPPER GI ENDOSCOPY      Current Outpatient Medications  Medication Sig Dispense Refill  . albuterol (PROVENTIL HFA;VENTOLIN HFA) 108 (90 Base) MCG/ACT inhaler Inhale 2 puffs into the lungs every 6 (six) hours as needed for wheezing or shortness of breath. 1 Inhaler 2  . aspirin EC 81 MG tablet Take 81 mg by mouth daily.    [name] atorvastatin (LIPITOR) 40 MG tablet Take 1 tablet (40 mg total) by mouth daily. 90 tablet 3  . calcitRIOL (ROCALTROL) 0.25 MCG capsule Take 1 capsule by mouth daily.    . carvedilol (COREG) 6.25 MG tablet Take 1 tablet (6.25 mg total) by mouth 2 (two) times daily with a meal. 180 tablet 3  . furosemide (LASIX) 40 MG tablet Take 1 tablet (40 mg total) by mouth 2 (two) times daily. 60 tablet 6  . gabapentin (NEURONTIN) 100 MG capsule Take 2 capsules (200 mg total) by mouth at bedtime. 180 capsule 1  . glucose blood (FREESTYLE TEST STRIPS) test strip Use to check blood sugar daily 100 each 3  . glucose monitoring kit (FREESTYLE) monitoring kit USE TO MONITOR BLOOD GLUCOSE AS DIRECTED 1 each 1  . Insulin Glargine (LANTUS SOLOSTAR) 100 UNIT/ML Solostar Pen Inject 18 Units into the skin daily.    . Insulin Pen Needle (UNIFINE PENTIPS) 31G X 5 MM MISC USE AS DIRECTED 100 each 0  . isosorbide mononitrate (IMDUR) 60 MG 24 hr tablet Take 1 tablet (60 mg total) by mouth daily. 30 tablet 6  . nitroGLYCERIN (NITROSTAT) 0.4 MG SL tablet Place 1 tablet (0.4 mg total) under the tongue every 5 (five) minutes as needed for chest pain. 2 doses before calling for emergent help 25 tablet 3  . oxymetazoline (AFRIN) 0.05 % nasal spray Place 3 sprays into both nostrils 2 (two) times daily as needed (nose bleed). 30 mL 0  . polyethylene glycol (MIRALAX / GLYCOLAX) packet Take 17 g by mouth daily. 14 each 0  . Polyethylene Glycol 400 (BLINK TEARS) 0.25 % SOLN Place 1 drop into both eyes 3 (three) times daily.    . sertraline (ZOLOFT) 100 MG tablet Take 1.5  tablets (150 mg total) by mouth daily. 135 tablet 1  . tamsulosin (FLOMAX) 0.4 MG CAPS capsule TAKE 1 CAPSULE BY MOUTH ONCE DAILY AFTER SUPPER 90 capsule 3  . triamcinolone cream (KENALOG) 0.1 % APPLY CREAM EXTERNALLY TWICE DAILY FOR 7 TO 10 DAYS MAXMIUM 80 g 0  . vitamin B-12 (CYANOCOBALAMIN) 1000 MCG tablet Take 1,000 mcg by mouth daily.     No current facility-administered medications for this visit.  Allergies  Allergen Reactions  . Bee Venom Anaphylaxis  . Lyrica [Pregabalin] Other (See Comments)    hallucinations  . Prednisone Other (See Comments)    hallucinations  . Zocor [Simvastatin] Nausea Only and Other (See Comments)    Headache with brand name only.  Can take the generic.    Review of Systems negative except from HPI and PMH  Physical Exam BP (!) 154/80   Pulse 72   Ht '5\' 7"'  (1.702 m)   Wt 204 lb 6.4 oz (92.7 kg)   SpO2 97%   BMI 32.01 kg/m  Well developed and nourished in no acute distress HENT L bells palsy Neck supple with JVP-flat Clear Device pocket well healed; without hematoma or erythema.  There is no tethering  Regular rate and rhythm, no murmurs or gallops Abd-soft with active BS No Clubbing cyanosis edema Skin-warm and dry A & Oriented  Grossly normal sensory and motor function   ECG   AV pacing with upright QRS V1 and neg lead 1    Assessment and  Plan Complete heart block  Ischemic heart disease with prior MI  ICD-CRT  [name] device was interrogated.  The information was reviewed. No changes were made in the programming.     CHF chronic diastolic  Renal insufficiency class IV   Without symptoms of ischemia  Euvolemic continue current meds

## 2018-05-10 ENCOUNTER — Other Ambulatory Visit: Payer: PPO | Admitting: Nurse Practitioner

## 2018-05-10 DIAGNOSIS — F419 Anxiety disorder, unspecified: Secondary | ICD-10-CM

## 2018-05-10 DIAGNOSIS — R419 Unspecified symptoms and signs involving cognitive functions and awareness: Secondary | ICD-10-CM | POA: Diagnosis not present

## 2018-05-10 DIAGNOSIS — E1140 Type 2 diabetes mellitus with diabetic neuropathy, unspecified: Secondary | ICD-10-CM

## 2018-05-10 DIAGNOSIS — R339 Retention of urine, unspecified: Secondary | ICD-10-CM | POA: Diagnosis not present

## 2018-05-10 DIAGNOSIS — H5712 Ocular pain, left eye: Secondary | ICD-10-CM

## 2018-05-10 DIAGNOSIS — Z794 Long term (current) use of insulin: Secondary | ICD-10-CM | POA: Diagnosis not present

## 2018-05-10 DIAGNOSIS — F339 Major depressive disorder, recurrent, unspecified: Secondary | ICD-10-CM

## 2018-05-10 DIAGNOSIS — Z515 Encounter for palliative care: Secondary | ICD-10-CM

## 2018-05-10 NOTE — Progress Notes (Signed)
PALLIATIVE CARE CONSULT VISIT   PATIENT NAME: Rodney Farley DOB: Aug 09, 1931 MRN: [id number]  PRIMARY CARE PROVIDER:   [name], MD  REFERRING PROVIDER:  [name], MD [place], [place] [address]  RESPONSIBLE PARTY:  Mindi Curling (son) Lacretia Nicks [phone]  [place] [phone]  Patient Care team Virl Axe- (cardiology) Dr. Wallis Mart (opthalmalogy) Dr. Justin Mend (nephrology)  HISTORY OF PRESENT ILLNESS:  Rodney Farley is a 84 y.o. year old male with multiple medical problems including ICM, CHB 2/2 AICD,HTN,HLD, stage IV CKD, T2DM with diabetic retinopathy and neuropathy,. Palliative Care was asked to help address goals of care.  ROS: wears glasses's; hard of hearing;Feels like something is scraching his left eye, itching and rash of lower ext. And entire body; depressed;doesn't get out of house for several days; little motivation to get out; not gone to church for 3weeks;still drives; sleeps well ; "can sleep all day"; financial difficulties; lost approx 46 lbs during bout of appendicitis but now appetite is good ; occasion sudden nosebleed; no chest pain or shortness of breath; pain in both feet;   ASSESSMENT/PLAN/RECOMMENDATIONS   Ischemic heart dx//MI CAD s/p PCI's CHB s/p AICD HTN HLD TIA -Followed by Dr. Caryl Comes (cards) -asa 62m -isosorbide 642mqd -PRN NTG -lipitor 4020mcoreg 6.63m40mD -lasix 40mg91m  -continue current regimen; no s/s of ischemia noted.  T2DM Peripheral neuropathy Diabetic retinopathy Bilateral trigger fingers  -last A1c shows good control -continue current regimen -continue gabapentin for neuropathy (consider increasing dose as tolerated)  Stage IV CKD  Uremic puritis -most recent BUN 51;sCR 3.86 est GFR 15.91 -continue gabapentin shown to help ;with uremic puritis -nephrology consult -if tolerated consider increasing gabapentin to 300mg/56m-most recent Phos. Level slightly elevated at  5.5/?phosphate binder given worsening of renal function  Bell's palsy following shingle's with left pytosis -patient reports feeling like "something is in his left eye scratchy " -use eye lubricants as needed -consider restasis    -Depression -Anxiety -Constipation -BPH -patient reports anhendonia and hypersomnia -encouraged patient to call LCSW for therapy -zoloft 150mg d38m -miralax -flomax 0.4mg -pa34mnt to call and make appointment with therapist  ACP Patient is full code at this time;spoke briefly about end of life issues -will set up date for discussion with patient, son and DIL after the holidays   I spent 60 minutes providing this consultation,  from 10:30 to 11:30. More than 50% of the time in this consultation was spent coordinating communication.     CODE STATUS:  Full code  PPS: 80% HOSPICE ELIGIBILITY/DIAGNOSIS: TBD  PAST MEDICAL HISTORY:  Past Medical History:  Diagnosis Date  . AICD (automatic cardioverter/defibrillator) present   . AKI (acute kidney injury) (HCC) 09/[place]2018  . Anemia   . Anginal pain (HCC)   .[place]   . Arm pain 05/08/2015   LEFT ARM  . Atrioventricular block, complete (HCC)    [place]2010 s/p pacemaker.  . Bell's palsy    left side. after shingles episode  . Bilateral renal cysts 07/23/2017   Simple and hemorrhagic noted on CT ab/pelvis   . BPH associated with nocturia   . Cardiomegaly   . Chronic systolic CHF (congestive heart failure) (HCC)    [place]40[place] echo [place]2014.  . CKD (c[place]land Kitchenronic kidney disease), stage IV (HCC)   .[place] (chronic obstructive pulmonary disease) (HCC)    Severe  . Coronary artery disease    a. [place] 1994[place]  s/p questionable PCI. 03/2015: progression of disease, for staged PCI.  [name] Depression   . Diabetic peripheral neuropathy ([place])   . Diarrhea   . Diverticulitis   . DOE (dyspnea on exertion) 03/28/2015  . Gallstones   . GERD (gastroesophageal reflux disease)   . Gout   . Hard of  hearing   . History of chronic pancreatitis 07/23/2017   noted on CT abd/pelvis  . History of shingles   . Hypercholesterolemia   . Hypertension   . Ischemic cardiomyopathy   . MI (myocardial infarction) ([place]) 1994; 1995  . Neuropathy    IN LOWER EXTREMITIES  . Nosebleed 10/06/2017   for 2 months most recent 10/06/2017  . Obesity   . Pacemaker    medtronic>>> MDT ICD 09/23/15  . Ruptured appendicitis   . Sleep apnea    "sleeps w/humidifyer when he panics and gets short of breath" (04/08/2015)  . TIA (transient ischemic attack) X 3  . Trigger middle finger of left hand   . Type II diabetes mellitus ([place])     SOCIAL HX:  Social History   Tobacco Use  . Smoking status: Former Smoker    Packs/day: 1.50    Years: 54.00    Pack years: 81.00    Types: Cigarettes    Last attempt to quit: 07/18/2007    Years since quitting: 10.8  . Smokeless tobacco: Never Used  . Tobacco comment: did not discuss LdCT but being evaluated for chole currently  Substance Use Topics  . Alcohol use: Yes    Comment: 04/08/2015 "probably 3 drinks/month"  separated from wife since august of 2018: here from [place] since 2006; here on [place].  ALLERGIES:  Allergies  Allergen Reactions  . Bee Venom Anaphylaxis  . Lyrica [Pregabalin] Other (See Comments)    hallucinations  . Prednisone Other (See Comments)    hallucinations  . Zocor [Simvastatin] Nausea Only and Other (See Comments)    Headache with brand name only.  Can take the generic.     PERTINENT MEDICATIONS:  Outpatient Encounter Medications as of 05/10/2018  Medication Sig  . albuterol (PROVENTIL HFA;VENTOLIN HFA) 108 (90 Base) MCG/ACT inhaler Inhale 2 puffs into the lungs every 6 (six) hours as needed for wheezing or shortness of breath.  [name] aspirin EC 81 MG tablet Take 81 mg by mouth daily.  [name] atorvastatin (LIPITOR) 40 MG tablet Take 1 tablet (40 mg total) by mouth daily.  . calcitRIOL (ROCALTROL) 0.25 MCG capsule Take 1 capsule by mouth daily.   . carvedilol (COREG) 6.25 MG tablet Take 1 tablet (6.25 mg total) by mouth 2 (two) times daily with a meal.  . furosemide (LASIX) 40 MG tablet Take 1 tablet (40 mg total) by mouth 2 (two) times daily.  [name] gabapentin (NEURONTIN) 100 MG capsule Take 2 capsules (200 mg total) by mouth at bedtime.  [name] glucose blood (FREESTYLE TEST STRIPS) test strip Use to check blood sugar daily  . glucose monitoring kit (FREESTYLE) monitoring kit USE TO MONITOR BLOOD GLUCOSE AS DIRECTED  . Insulin Glargine (LANTUS SOLOSTAR) 100 UNIT/ML Solostar Pen Inject 18 Units into the skin daily.  . Insulin Pen Needle (UNIFINE PENTIPS) 31G X 5 MM MISC USE AS DIRECTED  . isosorbide mononitrate (IMDUR) 60 MG 24 hr tablet Take 1 tablet (60 mg total) by mouth daily.  . nitroGLYCERIN (NITROSTAT) 0.4 MG SL tablet Place 1 tablet (0.4 mg total) under the tongue every 5 (five) minutes as needed for chest pain. 2 doses before  calling for emergent help  . oxymetazoline (AFRIN) 0.05 % nasal spray Place 3 sprays into both nostrils 2 (two) times daily as needed (nose bleed).  . polyethylene glycol (MIRALAX / GLYCOLAX) packet Take 17 g by mouth daily.  . Polyethylene Glycol 400 (BLINK TEARS) 0.25 % SOLN Place 1 drop into both eyes 3 (three) times daily.  . sertraline (ZOLOFT) 100 MG tablet Take 1.5 tablets (150 mg total) by mouth daily.  . tamsulosin (FLOMAX) 0.4 MG CAPS capsule TAKE 1 CAPSULE BY MOUTH ONCE DAILY AFTER SUPPER  . triamcinolone cream (KENALOG) 0.1 % APPLY CREAM EXTERNALLY TWICE DAILY FOR 7 TO 10 DAYS MAXMIUM  . vitamin B-12 (CYANOCOBALAMIN) 1000 MCG tablet Take 1,000 mcg by mouth daily.   No facility-administered encounter medications on file as of 05/10/2018.     PHYSICAL EXAM:   General: NAD, well-developed, well-nourished Cardiovascular: regular rate and rhythm Pulmonary: clear ant fields Abdomen: soft, nontender, + bowel sounds GU: no suprapubic tenderness Extremities: 1+ bilateral LE edema, no joint  deformities Skin: red prickly rash of both lower ext. Neurological: mild left ptosis; nonfocal  Stephanie G [name], NP

## 2018-05-13 DIAGNOSIS — M25642 Stiffness of left hand, not elsewhere classified: Secondary | ICD-10-CM | POA: Diagnosis not present

## 2018-05-13 DIAGNOSIS — M65332 Trigger finger, left middle finger: Secondary | ICD-10-CM | POA: Diagnosis not present

## 2018-05-13 DIAGNOSIS — M79645 Pain in left finger(s): Secondary | ICD-10-CM | POA: Diagnosis not present

## 2018-05-18 LAB — CUP PACEART INCLINIC DEVICE CHECK
Battery Remaining Longevity: 23 mo
Battery Voltage: 2.92 V
Brady Statistic AS VS Percent: 0.01 %
Brady Statistic RA Percent Paced: 87.28 %
Date Time Interrogation Session: 20191028170017
HIGH POWER IMPEDANCE MEASURED VALUE: 66 Ohm
Implantable Lead Implant Date: 20170313
Implantable Lead Implant Date: 20170313
Implantable Lead Location: 753859
Implantable Lead Location: 753860
Implantable Lead Model: 4598
Implantable Lead Model: 5076
Lead Channel Impedance Value: 342 Ohm
Lead Channel Impedance Value: 418 Ohm
Lead Channel Impedance Value: 456 Ohm
Lead Channel Impedance Value: 551 Ohm
Lead Channel Impedance Value: 665 Ohm
Lead Channel Impedance Value: 703 Ohm
Lead Channel Impedance Value: 760 Ohm
Lead Channel Impedance Value: 779 Ohm
Lead Channel Pacing Threshold Amplitude: 1.25 V
Lead Channel Pacing Threshold Pulse Width: 0.4 ms
Lead Channel Sensing Intrinsic Amplitude: 1.875 mV
Lead Channel Sensing Intrinsic Amplitude: 13.5 mV
Lead Channel Sensing Intrinsic Amplitude: 13.5 mV
Lead Channel Setting Pacing Amplitude: 2.5 V
Lead Channel Setting Pacing Amplitude: 2.75 V
Lead Channel Setting Pacing Amplitude: 3.25 V
Lead Channel Setting Pacing Pulse Width: 0.4 ms
Lead Channel Setting Pacing Pulse Width: 0.4 ms
MDC IDC LEAD IMPLANT DT: 20100112
MDC IDC LEAD LOCATION: 753858
MDC IDC MSMT LEADCHNL LV IMPEDANCE VALUE: 399 Ohm
MDC IDC MSMT LEADCHNL LV IMPEDANCE VALUE: 399 Ohm
MDC IDC MSMT LEADCHNL LV IMPEDANCE VALUE: 456 Ohm
MDC IDC MSMT LEADCHNL LV IMPEDANCE VALUE: 760 Ohm
MDC IDC MSMT LEADCHNL RA IMPEDANCE VALUE: 361 Ohm
MDC IDC MSMT LEADCHNL RA PACING THRESHOLD AMPLITUDE: 1.25 V
MDC IDC MSMT LEADCHNL RA PACING THRESHOLD PULSEWIDTH: 0.4 ms
MDC IDC MSMT LEADCHNL RA SENSING INTR AMPL: 2.25 mV
MDC IDC MSMT LEADCHNL RV PACING THRESHOLD AMPLITUDE: 0.5 V
MDC IDC MSMT LEADCHNL RV PACING THRESHOLD PULSEWIDTH: 0.4 ms
MDC IDC PG IMPLANT DT: 20170313
MDC IDC SET LEADCHNL RV SENSING SENSITIVITY: 0.3 mV
MDC IDC STAT BRADY AP VP PERCENT: 87.78 %
MDC IDC STAT BRADY AP VS PERCENT: 0.02 %
MDC IDC STAT BRADY AS VP PERCENT: 12.19 %
MDC IDC STAT BRADY RV PERCENT PACED: 99.87 %

## 2018-05-23 DIAGNOSIS — M25642 Stiffness of left hand, not elsewhere classified: Secondary | ICD-10-CM | POA: Diagnosis not present

## 2018-05-23 DIAGNOSIS — M65332 Trigger finger, left middle finger: Secondary | ICD-10-CM | POA: Diagnosis not present

## 2018-05-23 DIAGNOSIS — M79645 Pain in left finger(s): Secondary | ICD-10-CM | POA: Diagnosis not present

## 2018-06-01 ENCOUNTER — Ambulatory Visit (INDEPENDENT_AMBULATORY_CARE_PROVIDER_SITE_OTHER): Payer: PPO | Admitting: Family Medicine

## 2018-06-01 ENCOUNTER — Encounter: Payer: Self-pay | Admitting: Family Medicine

## 2018-06-01 VITALS — BP 132/80 | HR 81 | Temp 98.6°F | Ht 67.0 in | Wt 200.2 lb

## 2018-06-01 DIAGNOSIS — I10 Essential (primary) hypertension: Secondary | ICD-10-CM | POA: Diagnosis not present

## 2018-06-01 DIAGNOSIS — Z794 Long term (current) use of insulin: Secondary | ICD-10-CM

## 2018-06-01 DIAGNOSIS — Z6831 Body mass index (BMI) 31.0-31.9, adult: Secondary | ICD-10-CM | POA: Diagnosis not present

## 2018-06-01 DIAGNOSIS — E1122 Type 2 diabetes mellitus with diabetic chronic kidney disease: Secondary | ICD-10-CM | POA: Diagnosis not present

## 2018-06-01 DIAGNOSIS — I5022 Chronic systolic (congestive) heart failure: Secondary | ICD-10-CM | POA: Diagnosis not present

## 2018-06-01 DIAGNOSIS — N184 Chronic kidney disease, stage 4 (severe): Secondary | ICD-10-CM

## 2018-06-01 NOTE — Patient Instructions (Addendum)
we opted to trial 40 mg twice a day every other day and days in between just using once a day lasix. If his weight goes up more than 3 lbs on home scales can restart the full lasix dose twice a day or if he has increased shortness of breath or increased swelling. If symptoms worsen despite going back to twice a day everyday dosing- see [place] back immediately   Would continue to encourage you to consider counseling- I think it wil help you  Please schedule a follow up sometime in January and we can update your a1c and consider updating other labs.

## 2018-06-01 NOTE — Assessment & Plan Note (Signed)
S: swelling has reasonably controlled. Weight down another 4 lbs in last month. He is worried about the potential kidney issues with using high dose lasix Wt Readings from Last 3 Encounters:  06/01/18 200 lb 3.2 oz (90.8 kg)  05/09/18 204 lb 6.4 oz (92.7 kg)  04/15/18 206 lb (93.4 kg)  A/P: controlled- we opted to trial 40 mg twice a day every other day and days in between just using once a day lasix. If his weight goes up more than 3 lbs on home scales can restart the full lasix dose twice a day

## 2018-06-01 NOTE — Progress Notes (Signed)
Subjective:  Rodney Farley is a 84 y.o. year old very pleasant male patient who presents for/with See problem oriented charting ROS-denies low blood sugar, states has lost weight, states edema has improved.  No chest pain  reported  Past Medical History-  Patient Active Problem List   Diagnosis Date Noted  . Ruptured appendicitis 03/26/2017    Priority: High  . Marital stress 02/18/2017    Priority: High  . COPD (chronic obstructive pulmonary disease) ([place])     Priority: High  . Chronic systolic CHF (congestive heart failure) ([place]) 04/08/2015    Priority: High  . DOE (dyspnea on exertion), due to significant CAD 03/28/2015    Priority: High  . Chronic kidney disease (CKD), stage IV (severe) ([place]) 03/04/2015    Priority: High  . Coronary artery disease involving native coronary artery of native heart with angina pectoris ([place]) 09/13/2014    Priority: High  . Depression, major, recurrent, moderate ([place]) 08/05/2012    Priority: High  . Controlled type 2 diabetes mellitus with stage 4 chronic kidney disease ([place]) 08/05/2012    Priority: High  . Ischemic cardiomyopathy 11/03/2011    Priority: High  . Atrioventricular block, complete (HCC)     Priority: High  . Pacemaker     Priority: High  . Hyperlipidemia 04/13/2018    Priority: Medium  . BPH associated with nocturia 04/27/2016    Priority: Medium  . Diabetic peripheral neuropathy (HCC)     Priority: Medium  . Bell's palsy     Priority: Medium  . Gout 06/26/2015    Priority: Medium  . Sleep apnea 08/05/2012    Priority: Medium  . HTN (hypertension) 08/05/2012    Priority: Medium  . Diverticulitis 04/08/2015    Priority: Low  . GERD (gastroesophageal reflux disease) 08/07/2012    Priority: Low  . Obesity     Priority: Low  . Elevated troponin 10/15/2017  . S/P cholecystectomy 10/13/2017  . Symptomatic cholelithiasis 10/13/2017  . Epistaxis 10/13/2017  . Left shoulder pain 10/13/2017  . Diarrhea 05/11/2017   . Trigger finger 06/10/2016  . Angina pectoris ([place]) 10/16/2014  . Depression 08/05/2012    Medications- reviewed and updated Current Outpatient Medications  Medication Sig Dispense Refill  . albuterol (PROVENTIL HFA;VENTOLIN HFA) 108 (90 Base) MCG/ACT inhaler Inhale 2 puffs into the lungs every 6 (six) hours as needed for wheezing or shortness of breath. 1 Inhaler 2  . aspirin EC 81 MG tablet Take 81 mg by mouth daily.    [name] atorvastatin (LIPITOR) 40 MG tablet Take 1 tablet (40 mg total) by mouth daily. 90 tablet 3  . calcitRIOL (ROCALTROL) 0.25 MCG capsule Take 1 capsule by mouth daily.    . carvedilol (COREG) 6.25 MG tablet Take 1 tablet (6.25 mg total) by mouth 2 (two) times daily with a meal. 180 tablet 3  . furosemide (LASIX) 40 MG tablet Take 1 tablet (40 mg total) by mouth 2 (two) times daily. 60 tablet 6  . gabapentin (NEURONTIN) 100 MG capsule Take 2 capsules (200 mg total) by mouth at bedtime. 180 capsule 1  . glucose blood (FREESTYLE TEST STRIPS) test strip Use to check blood sugar daily 100 each 3  . glucose monitoring kit (FREESTYLE) monitoring kit USE TO MONITOR BLOOD GLUCOSE AS DIRECTED 1 each 1  . Insulin Glargine (LANTUS SOLOSTAR) 100 UNIT/ML Solostar Pen Inject 18 Units into the skin daily.    . Insulin Pen Needle (UNIFINE PENTIPS) 31G X 5 MM MISC USE AS  DIRECTED 100 each 0  . isosorbide mononitrate (IMDUR) 60 MG 24 hr tablet Take 1 tablet (60 mg total) by mouth daily. 30 tablet 6  . nitroGLYCERIN (NITROSTAT) 0.4 MG SL tablet Place 1 tablet (0.4 mg total) under the tongue every 5 (five) minutes as needed for chest pain. 2 doses before calling for emergent help 25 tablet 3  . oxymetazoline (AFRIN) 0.05 % nasal spray Place 3 sprays into both nostrils 2 (two) times daily as needed (nose bleed). 30 mL 0  . polyethylene glycol (MIRALAX / GLYCOLAX) packet Take 17 g by mouth daily. 14 each 0  . Polyethylene Glycol 400 (BLINK TEARS) 0.25 % SOLN Place 1 drop into both eyes 3 (three)  times daily.    . sertraline (ZOLOFT) 100 MG tablet Take 1.5 tablets (150 mg total) by mouth daily. 135 tablet 1  . tamsulosin (FLOMAX) 0.4 MG CAPS capsule TAKE 1 CAPSULE BY MOUTH ONCE DAILY AFTER SUPPER 90 capsule 3  . triamcinolone cream (KENALOG) 0.1 % APPLY CREAM EXTERNALLY TWICE DAILY FOR 7 TO 10 DAYS MAXMIUM 80 g 0  . vitamin B-12 (CYANOCOBALAMIN) 1000 MCG tablet Take 1,000 mcg by mouth daily.     No current facility-administered medications for this visit.     Objective: BP 132/80 (BP Location: Left Arm, Patient Position: Sitting, Cuff Size: Large)   Pulse 81   Temp 98.6 F (37 C) (Oral)   Ht '5\' 7"'  (1.702 m)   Wt 200 lb 3.2 oz (90.8 kg)   SpO2 95%   BMI 31.36 kg/m  Gen: NAD, resting comfortably CV: RRR no murmurs rubs or gallops Lungs: CTAB no crackles, wheeze, rhonchi Abdomen: soft/nontender in area of reported pain in right upper quadrant/nondistended/normal bowel sounds. No rebound or guarding.  Ext: Trace edema under compression stockings. Skin: warm, dry Neuro: Speech normal, moves all extremities, walks with cane  Assessment/Plan:  Other notes: 1.  Stable RUQ pain 5/10 with yawn but 1/10 most of time- worse with lying in bed on left side 2. Visit with palliative care NP with [facility] and [facility] [place]  Chronic systolic CHF (congestive heart failure) ([place]) S: swelling has reasonably controlled. Weight down another 4 lbs in last month. He is worried about the potential kidney issues with using high dose lasix Wt Readings from Last 3 Encounters:  06/01/18 200 lb 3.2 oz (90.8 kg)  05/09/18 204 lb 6.4 oz (92.7 kg)  04/15/18 206 lb (93.4 kg)  A/P: controlled- we opted to trial 40 mg twice a day every other day and days in between just using once a day lasix. If his weight goes up more than 3 lbs on home scales can restart the full lasix dose twice a day  HTN (hypertension) S: controlled on carvedilol 6.25 mg twice daily, Lasix 40 mg twice daily, Imdur 60  mg BP Readings from Last 3 Encounters:  06/01/18 132/80  05/09/18 (!) 154/80  04/15/18 120/70  A/P: We discussed blood pressure goal of <140/90. Continue current meds: But may decrease Lasix as per CHF section  Controlled type 2 diabetes mellitus with stage 4 chronic kidney disease ([place]) S: well controlled on lantus 18 units CBGs- 122 this morning, no lows recently Lab Results  Component Value Date   HGBA1C 5.3 04/13/2018   HGBA1C 5.6 10/07/2017   HGBA1C 5.6 06/18/2017   A/P: Patient will let me know if he has any lows- we are hopeful at next visit to be able to reduce Lantus further.  Appears stable  Future Appointments  Date Time Provider [place]  06/08/2018  4:20 PM [name], Kaiser M, MD [facility]  06/20/2018  7:50 AM [facility] DEVICE REMOTES [facility]  08/03/2018 11:00 AM [name], MD [facility]   Encouraged 2 months follow-up for repeat hemoglobin A1c- consider updating other labs-we will see if we got labs from chronic kidney  Lab/Order associations: Chronic systolic CHF (congestive heart failure) ([place])  Essential hypertension  Controlled type 2 diabetes mellitus with stage 4 chronic kidney disease, with long-term current use of insulin ([place])  Return precautions advised.  Garret Reddish, MD

## 2018-06-01 NOTE — Assessment & Plan Note (Signed)
S: controlled on carvedilol 6.25 mg twice daily, Lasix 40 mg twice daily, Imdur 60 mg BP Readings from Last 3 Encounters:  06/01/18 132/80  05/09/18 (!) 154/80  04/15/18 120/70  A/P: We discussed blood pressure goal of <140/90. Continue current meds: But may decrease Lasix as per CHF section

## 2018-06-01 NOTE — Assessment & Plan Note (Signed)
S: well controlled on lantus 18 units CBGs- 122 this morning, no lows recently Lab Results  Component Value Date   HGBA1C 5.3 04/13/2018   HGBA1C 5.6 10/07/2017   HGBA1C 5.6 06/18/2017   A/P: Patient will let me know if he has any lows- we are hopeful at next visit to be able to reduce Lantus further.  Appears stable

## 2018-06-03 NOTE — Progress Notes (Signed)
CARDIOLOGY OFFICE NOTE  Date:  06/08/2018    Rodney Farley Date of Birth: Jul 31, 1931 Medical Record #[id number]  PCP:  [name], MD  Cardiologist:  Tomasz [name]  MD  Chief Complaint  Patient presents with  . Coronary Artery Disease  . Congestive Heart Failure    History of Present Illness: Rodney Farley is a 84 y.o. male who is seen for follow up CAD and CHF.   He has a history of CAD (s/p MI in 1994/1995 while in [place] s/p questionable PCI, , CHB 2010 s/p pacemaker, CKD stage IV, DM, HTN, HLD, remote tobacco abuse, COPD, moderate sleep apnea, and chronic systolic CHF.    In 2016 he had progression of DOE, chest discomfort and chest pounding with severe limitation on exertion. 2D Echo 09/2014 showed EF 35-40% with +WMA, grade 1 DD, mild MR. PFTs looked remarkably good. He underwent cardiac cath on 03/28/15 and Cr was 2.14 . LE duplex negative for DVT. Cath showed severe 3V CAD - the mid LAD, second diagonal, and third OM branches appeared suitable for PCI +/- distal LCx. The other option would be CABG. After discussion concerning risk and benefits hewas treated with PCI.   Patient underwent Multivessel PCI including stenting of the mid LAD, second diagonal, and third OM with DES on 04/09/15.  Distal 70% circumflex lesion  treated medically.He was started on aspirin and Plavix. Hydralazine was added for afterload reduction and better blood pressure control.   After his procedure he  had a marked improvement in his symptoms of dyspnea.  No chest pain. Breathing was much better. He had repeat Echo that still showed depressed EF 25-30% despite revascularization. He underwent upgrade of PPM to a BiV ICD device by Dr. Caryl Comes on 09/23/15. Follow up Echo in July 2017 showed normalization of EF.     He had a prolonged hospitalization from 9/14-9/25/2018 when he presented to the hospital with right lower quadrant abdominal pain and found to have a ruptured appendicitis  with abscess. Gen. Surgery was consulted and performed exploratory laparotomy with ileocecectomy on 03/27/2017. During the hospitalization, he also had acute on chronic renal insufficiency with peak creatinine of 5.75. However subsequent creatinine came down to 2.44. Followed by nephrology with reported stable renal function with GFR in 20s.    He underwent cholecystectomy on 10/13/2017.  Postoperative course was complicated by elevated troponin involving neck and shoulder pain.  Troponin peaked at the 2.14.  It was felt is likely demand ischemia associated with surgery.  Repeat echocardiogram obtained on 10/15/2017 showed EF 60 to 65%, paradoxical ventricular motion, mild MR.  Myoview obtained on the following day was intermediate risk was a large defect of severe severity present in the basal anteroseptal, mid anterior septal and apex location, this is large and a partially reversible defect concerning for mild to moderate ischemia.  Options was discussed with both the patient and his son, who preferred medical approach more than cardiac catheterization at this time.  Plavix was not started at this time due to his nosebleed and anemia. He was seen in follow up later in April with increased LE edema. Had run out of lasix for several days. Was started back on lasix 40 mg bid with compression hose and elevation of feet. Close follow up with Renal recommended. Last creatinine there in July was 4.05.  ICD check on May 09, 2018 showed 100% BiV pacing. No significant arrhythmias.   On follow up today he is doing  well. Denies any dyspnea, chest pain or increased edema.  He is wearing support hose. His lasix dose was recently reduce to 80 mg in the morning and 40 mg in the evening. He did have surgery for a trigger finger. He has lost 11 lbs and his insulin dose has been reduced.   Past Medical History:  Diagnosis Date  . AICD (automatic cardioverter/defibrillator) present   . AKI (acute kidney injury) ([place])  03/27/2017  . Anemia   . Anginal pain ([place])   . Anxiety   . Arm pain 05/08/2015   LEFT ARM  . Atrioventricular block, complete ([place])    a. 2010 s/p pacemaker.  . Bell's palsy    left side. after shingles episode  . Bilateral renal cysts 07/23/2017   Simple and hemorrhagic noted on CT ab/pelvis   . BPH associated with nocturia   . Cardiomegaly   . Chronic systolic CHF (congestive heart failure) ([place])    a. LVEF 35-40% by echo 09/2014.  [name] CKD (chronic kidney disease), stage IV ([place])   . COPD (chronic obstructive pulmonary disease) ([facility])    Severe  . Coronary artery disease    a. s/p MI in 1994/1995 while in [place] s/p questionable PCI. 03/2015: progression of disease, for staged PCI.  [name] Depression   . Diabetic peripheral neuropathy ([place])   . Diarrhea   . Diverticulitis   . DOE (dyspnea on exertion) 03/28/2015  . Gallstones   . GERD (gastroesophageal reflux disease)   . Gout   . Hard of hearing   . History of chronic pancreatitis 07/23/2017   noted on CT abd/pelvis  . History of shingles   . Hypercholesterolemia   . Hypertension   . Ischemic cardiomyopathy   . MI (myocardial infarction) ([place]) 1994; 1995  . Neuropathy    IN LOWER EXTREMITIES  . Nosebleed 10/06/2017   for 2 months most recent 10/06/2017  . Obesity   . Pacemaker    medtronic>>> MDT ICD 09/23/15  . Ruptured appendicitis   . Sleep apnea    "sleeps w/humidifyer when he panics and gets short of breath" (04/08/2015)  . TIA (transient ischemic attack) X 3  . Trigger middle finger of left hand   . Type II diabetes mellitus ([place])     Past Surgical History:  Procedure Laterality Date  . CARDIAC CATHETERIZATION N/A 03/29/2015   Procedure: Right/Left Heart Cath and Coronary Angiography;  Surgeon: Geremiah M [name], MD;  Location: [facility];  Service: Cardiovascular;  Laterality: N/A;  . [employer]   "after my MI; put me on heart RX after cath"  . CARDIAC CATHETERIZATION N/A 04/09/2015    Procedure: Coronary Stent Intervention;  Surgeon: Karel M [name], MD;  Location: [facility];  Service: Cardiovascular;  Laterality: N/A;  . CHOLECYSTECTOMY N/A 10/13/2017   Procedure: LAPAROSCOPIC CHOLECYSTECTOMY WITH LYSIS OF ADHESIONS;  Surgeon: Ileana Roup, MD;  Location: [facility];  Service: General;  Laterality: N/A;  . COLONOSCOPY    . DENTAL SURGERY    . EP IMPLANTABLE DEVICE N/A 09/23/2015   MDT CRT-D, Dr. Caryl Comes  . HIATAL HERNIA REPAIR  1977  . ILEOCECETOMY N/A 03/27/2017   Procedure: ILEOCECECTOMY;  Surgeon: Ileana Roup, MD;  Location: [facility];  Service: General;  Laterality: N/A;  . INSERT / REPLACE / REMOVE PACEMAKER  07/2008   Complete heart block status post DDD with good function  . LAPAROTOMY N/A 03/27/2017   Procedure: EXPLORATORY LAPAROTOMY;  Surgeon: Dema Severin,  Sharon Mt, MD;  Location: [facility];  Service: General;  Laterality: N/A;  . TONSILLECTOMY    . UPPER GI ENDOSCOPY       Medications: Current Outpatient Medications  Medication Sig Dispense Refill  . albuterol (PROVENTIL HFA;VENTOLIN HFA) 108 (90 Base) MCG/ACT inhaler Inhale 2 puffs into the lungs every 6 (six) hours as needed for wheezing or shortness of breath. 1 Inhaler 2  . aspirin EC 81 MG tablet Take 81 mg by mouth daily.    [name] atorvastatin (LIPITOR) 40 MG tablet Take 1 tablet (40 mg total) by mouth daily. 90 tablet 3  . calcitRIOL (ROCALTROL) 0.25 MCG capsule Take 1 capsule by mouth daily.    . carvedilol (COREG) 6.25 MG tablet Take 1 tablet (6.25 mg total) by mouth 2 (two) times daily with a meal. 180 tablet 3  . furosemide (LASIX) 40 MG tablet Take 1 tablet (40 mg total) by mouth 2 (two) times daily. 60 tablet 6  . gabapentin (NEURONTIN) 100 MG capsule Take 2 capsules (200 mg total) by mouth at bedtime. 180 capsule 1  . glucose blood (FREESTYLE TEST STRIPS) test strip Use to check blood sugar daily 100 each 3  . glucose monitoring kit (FREESTYLE) monitoring kit USE TO MONITOR BLOOD GLUCOSE AS  DIRECTED 1 each 1  . Insulin Glargine (LANTUS SOLOSTAR) 100 UNIT/ML Solostar Pen Inject 18 Units into the skin daily.    . Insulin Pen Needle (UNIFINE PENTIPS) 31G X 5 MM MISC USE AS DIRECTED 100 each 0  . isosorbide mononitrate (IMDUR) 60 MG 24 hr tablet Take 1 tablet (60 mg total) by mouth daily. 30 tablet 6  . nitroGLYCERIN (NITROSTAT) 0.4 MG SL tablet Place 1 tablet (0.4 mg total) under the tongue every 5 (five) minutes as needed for chest pain. 2 doses before calling for emergent help 25 tablet 3  . oxymetazoline (AFRIN) 0.05 % nasal spray Place 3 sprays into both nostrils 2 (two) times daily as needed (nose bleed). 30 mL 0  . polyethylene glycol (MIRALAX / GLYCOLAX) packet Take 17 g by mouth daily. 14 each 0  . Polyethylene Glycol 400 (BLINK TEARS) 0.25 % SOLN Place 1 drop into both eyes 3 (three) times daily.    . sertraline (ZOLOFT) 100 MG tablet Take 1.5 tablets (150 mg total) by mouth daily. 135 tablet 1  . tamsulosin (FLOMAX) 0.4 MG CAPS capsule TAKE 1 CAPSULE BY MOUTH ONCE DAILY AFTER SUPPER 90 capsule 3  . triamcinolone cream (KENALOG) 0.1 % APPLY CREAM EXTERNALLY TWICE DAILY FOR 7 TO 10 DAYS MAXMIUM 80 g 0  . vitamin B-12 (CYANOCOBALAMIN) 1000 MCG tablet Take 1,000 mcg by mouth daily.     No current facility-administered medications for this visit.     Allergies: Allergies  Allergen Reactions  . Bee Venom Anaphylaxis  . Lyrica [Pregabalin] Other (See Comments)    hallucinations  . Prednisone Other (See Comments)    hallucinations  . Zocor [Simvastatin] Nausea Only and Other (See Comments)    Headache with brand name only.  Can take the generic.    Social History: The patient  reports that he quit smoking about 10 years ago. His smoking use included cigarettes. He has a 81.00 pack-year smoking history. He has never used smokeless tobacco. He reports that he drinks alcohol. He reports that he does not use drugs.   Family History: [name] family history includes Heart  attack in his brother; Leukemia in his father; Stroke in his mother and sister.  Review of Systems: Please see the history of present illness.   Otherwise, the review of systems is positive for none.   All other systems are reviewed and negative.   Physical Exam: VS:  BP (!) 152/78   Pulse 77   Ht 5' 7" (1.702 m)   Wt 202 lb (91.6 kg)   SpO2 99%   BMI 31.64 kg/m  .  BMI Body mass index is 31.64 kg/m.  Wt Readings from Last 3 Encounters:  06/08/18 202 lb (91.6 kg)  06/01/18 200 lb 3.2 oz (90.8 kg)  05/09/18 204 lb 6.4 oz (92.7 kg)    GENERAL:  Well overweight elderly WM in NAD HEENT:  PERRL, EOMI, sclera are clear. Oropharynx is clear. NECK:  No jugular venous distention, carotid upstroke brisk and symmetric, no bruits, no thyromegaly or adenopathy LUNGS:  Clear to auscultation bilaterally CHEST:  Unremarkable HEART:  RRR,  PMI not displaced or sustained,S1 and S2 within normal limits, no S3, no S4: no clicks, no rubs, no murmurs ABD:  Soft, nontender. BS +, no masses or bruits. Midline lower abdominal surgical wound has healed well.  EXT:  2 + pulses throughout, no  edema, no cyanosis no clubbing SKIN:  Warm and dry.  No rashes NEURO:  Alert and oriented x 3. Cranial nerves II through XII intact. PSYCH:  Cognitively intact     LABORATORY DATA:  EKG:  EKG is not ordered today.  Lab Results  Component Value Date   WBC 8.2 04/13/2018   HGB 10.6 (L) 04/13/2018   HCT 30.9 (L) 04/13/2018   PLT 192.0 04/13/2018   GLUCOSE 109 (H) 04/13/2018   CHOL 167 04/13/2018   TRIG 316.0 (H) 04/13/2018   HDL 33.00 (L) 04/13/2018   LDLDIRECT 60.0 04/13/2018   LDLCALC 65 06/26/2015   ALT 16 04/13/2018   AST 14 04/13/2018   NA 142 04/13/2018   K 4.7 04/13/2018   CL 111 04/13/2018   CREATININE 3.86 (H) 04/13/2018   BUN 51 (H) 04/13/2018   CO2 21 04/13/2018   TSH 1.24 04/13/2018   PSA 0.55 09/19/2014   INR 0.97 10/13/2017   HGBA1C 5.3 04/13/2018   MICROALBUR 180.8 (H)  04/13/2018    BNP (last 3 results) No results for input(s): BNP in the last 8760 hours.  ProBNP (last 3 results) No results for input(s): PROBNP in the last 8760 hours.   Other Studies Reviewed Today:  Labs dated 03/11/18: glucose 119, BUN 62, creatinine 3.52. Potassium 6.2. Hgb 10.3.   Echo Study Conclusions from 01/23/16:  Study Conclusions  - Left ventricle: The cavity size was normal. Wall thickness was   increased in a pattern of mild LVH. Systolic function was normal.   The estimated ejection fraction was in the range of 60% to 65%.   Doppler parameters are consistent with abnormal left ventricular   relaxation (grade 1 diastolic dysfunction).  Echo 10/15/17: Study Conclusions  - Left ventricle: The cavity size was normal. There was severe   concentric hypertrophy. Systolic function was normal. The   estimated ejection fraction was in the range of 60% to 65%. Wall   motion was normal; there were no regional wall motion   abnormalities. The study was not technically sufficient to allow   evaluation of LV diastolic dysfunction due to atrial   fibrillation. Doppler parameters are consistent with high   ventricular filling pressure. - Ventricular septum: Septal motion showed moderate paradox. These   changes are consistent with right ventricular pacing. -   Aortic valve: Trileaflet; mildly thickened, moderately calcified   leaflets. - Mitral valve: There was mild regurgitation. - Left atrium: The atrium was mildly dilated.   Myoview 10/16/17: Defect 1: There is a large defect of severe severity present in the basal anteroseptal, mid anteroseptal and apex location.  This is an intermediate risk study.  The left ventricular ejection fraction is mildly decreased (45-54%).   Abnormal, intermediate risk stress nuclear study with large, partially reversible anteroseptal and apical defect; some of defect may be related to ventricular paced rhythm; there also appears to be mild to  moderate ischemia; EF 48 with hypokinesis of the distal septum and apex; moderate LVE.   Assessment/Plan: 1. CAD - s/p  Cardiac catheterization in 2016 revealing Mid LAD lesion, 90% stenosed, 2nd Diag lesion, 90% stenosed, 3rd Mrg lesion, 90% stenosed. He received drug-eluting stents to each lesion in September 2016. Distal 70% circumflex lesion is treated medically. He remains asymptomatic.  He did have mild troponin elevation post cholecystectomy - felt to be more demand ischemia.  Continue ASA, Imdur, Coreg and statin  2. ICM EF 25-30%. S/p BiV ICD. Normal device function in October 2018.  Echo in July 2017 and April 2019  showed normalization of EF. ICD followed by Dr. Caryl Comes  3. Chronic systolic HF -  He is on optimal therapy with Coreg, diuretics,  and nitrates. Not a candidate for ACEi/ARB  due to CKD. Appears well compensated and recent reduction in lasix dose.   4. HTN - controlled  5. HLD - on statin therapy with good LDL control.   6. S/p cholecystectomy  7. CKD stage IV. Followed by Nephrology    Signed: Valery [name] MD, [facility]    06/08/2018 4:25 PM

## 2018-06-08 ENCOUNTER — Ambulatory Visit: Payer: PPO | Admitting: Cardiology

## 2018-06-08 ENCOUNTER — Encounter: Payer: Self-pay | Admitting: Cardiology

## 2018-06-08 VITALS — BP 152/78 | HR 77 | Ht 67.0 in | Wt 202.0 lb

## 2018-06-08 DIAGNOSIS — I2510 Atherosclerotic heart disease of native coronary artery without angina pectoris: Secondary | ICD-10-CM | POA: Diagnosis not present

## 2018-06-08 DIAGNOSIS — I5022 Chronic systolic (congestive) heart failure: Secondary | ICD-10-CM

## 2018-06-08 DIAGNOSIS — Z95810 Presence of automatic (implantable) cardiac defibrillator: Secondary | ICD-10-CM

## 2018-06-08 DIAGNOSIS — E785 Hyperlipidemia, unspecified: Secondary | ICD-10-CM

## 2018-06-08 DIAGNOSIS — N184 Chronic kidney disease, stage 4 (severe): Secondary | ICD-10-CM | POA: Diagnosis not present

## 2018-06-14 DIAGNOSIS — N179 Acute kidney failure, unspecified: Secondary | ICD-10-CM | POA: Diagnosis not present

## 2018-06-14 DIAGNOSIS — N2581 Secondary hyperparathyroidism of renal origin: Secondary | ICD-10-CM | POA: Diagnosis not present

## 2018-06-14 DIAGNOSIS — K3532 Acute appendicitis with perforation and localized peritonitis, without abscess: Secondary | ICD-10-CM | POA: Diagnosis not present

## 2018-06-14 DIAGNOSIS — R600 Localized edema: Secondary | ICD-10-CM | POA: Diagnosis not present

## 2018-06-14 DIAGNOSIS — K8020 Calculus of gallbladder without cholecystitis without obstruction: Secondary | ICD-10-CM | POA: Diagnosis not present

## 2018-06-14 DIAGNOSIS — E875 Hyperkalemia: Secondary | ICD-10-CM | POA: Diagnosis not present

## 2018-06-14 DIAGNOSIS — I129 Hypertensive chronic kidney disease with stage 1 through stage 4 chronic kidney disease, or unspecified chronic kidney disease: Secondary | ICD-10-CM | POA: Diagnosis not present

## 2018-06-14 DIAGNOSIS — N184 Chronic kidney disease, stage 4 (severe): Secondary | ICD-10-CM | POA: Diagnosis not present

## 2018-06-14 LAB — BASIC METABOLIC PANEL
BUN: 51 — AB (ref 4–21)
CREATININE: 3.4 — AB (ref 0.6–1.3)
Glucose: 93
Potassium: 5.3 (ref 3.4–5.3)
SODIUM: 141 (ref 137–147)

## 2018-06-14 LAB — CBC AND DIFFERENTIAL
HCT: 29 — AB (ref 41–53)
Hemoglobin: 9.7 — AB (ref 13.5–17.5)
NEUTROS ABS: 6
PLATELETS: 220 (ref 150–399)
WBC: 9.4

## 2018-06-20 ENCOUNTER — Ambulatory Visit (INDEPENDENT_AMBULATORY_CARE_PROVIDER_SITE_OTHER): Payer: PPO

## 2018-06-20 DIAGNOSIS — I255 Ischemic cardiomyopathy: Secondary | ICD-10-CM

## 2018-06-20 DIAGNOSIS — I442 Atrioventricular block, complete: Secondary | ICD-10-CM | POA: Diagnosis not present

## 2018-06-21 ENCOUNTER — Other Ambulatory Visit: Payer: Self-pay

## 2018-06-21 MED ORDER — NITROGLYCERIN 0.4 MG SL SUBL: 0.4000 mg | SUBLINGUAL_TABLET | SUBLINGUAL | 3 refills | Status: DC | PRN

## 2018-06-22 NOTE — Progress Notes (Signed)
Remote ICD transmission.   

## 2018-06-23 ENCOUNTER — Encounter: Payer: Self-pay | Admitting: Family Medicine

## 2018-07-08 ENCOUNTER — Other Ambulatory Visit: Payer: Self-pay | Admitting: Family Medicine

## 2018-07-13 ENCOUNTER — Other Ambulatory Visit: Payer: Self-pay | Admitting: Physician Assistant

## 2018-08-03 ENCOUNTER — Ambulatory Visit (INDEPENDENT_AMBULATORY_CARE_PROVIDER_SITE_OTHER): Payer: PPO | Admitting: Family Medicine

## 2018-08-03 ENCOUNTER — Encounter: Payer: Self-pay | Admitting: Family Medicine

## 2018-08-03 VITALS — BP 136/84 | HR 80 | Temp 98.1°F | Ht 67.0 in | Wt 204.6 lb

## 2018-08-03 DIAGNOSIS — Z6832 Body mass index (BMI) 32.0-32.9, adult: Secondary | ICD-10-CM | POA: Diagnosis not present

## 2018-08-03 DIAGNOSIS — N2581 Secondary hyperparathyroidism of renal origin: Secondary | ICD-10-CM | POA: Diagnosis not present

## 2018-08-03 DIAGNOSIS — I5022 Chronic systolic (congestive) heart failure: Secondary | ICD-10-CM | POA: Diagnosis not present

## 2018-08-03 DIAGNOSIS — Z794 Long term (current) use of insulin: Secondary | ICD-10-CM

## 2018-08-03 DIAGNOSIS — I442 Atrioventricular block, complete: Secondary | ICD-10-CM | POA: Diagnosis not present

## 2018-08-03 DIAGNOSIS — J449 Chronic obstructive pulmonary disease, unspecified: Secondary | ICD-10-CM

## 2018-08-03 DIAGNOSIS — E1122 Type 2 diabetes mellitus with diabetic chronic kidney disease: Secondary | ICD-10-CM

## 2018-08-03 DIAGNOSIS — K3532 Acute appendicitis with perforation and localized peritonitis, without abscess: Secondary | ICD-10-CM | POA: Diagnosis not present

## 2018-08-03 DIAGNOSIS — F331 Major depressive disorder, recurrent, moderate: Secondary | ICD-10-CM

## 2018-08-03 DIAGNOSIS — E785 Hyperlipidemia, unspecified: Secondary | ICD-10-CM | POA: Diagnosis not present

## 2018-08-03 DIAGNOSIS — K8020 Calculus of gallbladder without cholecystitis without obstruction: Secondary | ICD-10-CM | POA: Diagnosis not present

## 2018-08-03 DIAGNOSIS — N184 Chronic kidney disease, stage 4 (severe): Secondary | ICD-10-CM | POA: Diagnosis not present

## 2018-08-03 DIAGNOSIS — E875 Hyperkalemia: Secondary | ICD-10-CM | POA: Diagnosis not present

## 2018-08-03 DIAGNOSIS — N179 Acute kidney failure, unspecified: Secondary | ICD-10-CM | POA: Diagnosis not present

## 2018-08-03 DIAGNOSIS — R600 Localized edema: Secondary | ICD-10-CM | POA: Diagnosis not present

## 2018-08-03 DIAGNOSIS — I129 Hypertensive chronic kidney disease with stage 1 through stage 4 chronic kidney disease, or unspecified chronic kidney disease: Secondary | ICD-10-CM | POA: Diagnosis not present

## 2018-08-03 LAB — BASIC METABOLIC PANEL
BUN: 52 — AB (ref 4–21)
Creatinine: 3.2 — AB (ref 0.6–1.3)
Glucose: 88
POTASSIUM: 5.1 (ref 3.4–5.3)
SODIUM: 139 (ref 137–147)

## 2018-08-03 LAB — POCT GLYCOSYLATED HEMOGLOBIN (HGB A1C): HEMOGLOBIN A1C: 5.3 % (ref 4.0–5.6)

## 2018-08-03 NOTE — Progress Notes (Addendum)
Subjective:  Rodney Farley is a 85 y.o. year old very pleasant male patient who presents for/with See problem oriented charting ROS-no increased swelling.  No low blood sugars.  No chest pain reported.  Complains of depressed mood and anhedonia.  No suicidal ideation.  Past Medical History-  Patient Active Problem List   Diagnosis Date Noted  . Ruptured appendicitis 03/26/2017    Priority: High  . Marital stress 02/18/2017    Priority: High  . COPD (chronic obstructive pulmonary disease) ([place])     Priority: High  . Chronic systolic CHF (congestive heart failure) ([place]) 04/08/2015    Priority: High  . DOE (dyspnea on exertion), due to significant CAD 03/28/2015    Priority: High  . Chronic kidney disease (CKD), stage IV (severe) ([place]) 03/04/2015    Priority: High  . Coronary artery disease involving native coronary artery of native heart with angina pectoris ([place]) 09/13/2014    Priority: High  . Depression, major, recurrent, moderate ([place]) 08/05/2012    Priority: High  . Controlled type 2 diabetes mellitus with stage 4 chronic kidney disease ([place]) 08/05/2012    Priority: High  . Ischemic cardiomyopathy 11/03/2011    Priority: High  . Atrioventricular block, complete (H[facility])     Priority: High  . Pacemaker     Priority: High  . Hyperlipidemia 04/13/2018    Priority: Medium  . BPH associated with nocturia 04/27/2016    Priority: Medium  . Diabetic peripheral neuropathy ([facility])     Priority: Medium  . Bell's palsy     Priority: Medium  . Gout 06/26/2015    Priority: Medium  . Sleep apnea 08/05/2012    Priority: Medium  . HTN (hypertension) 08/05/2012    Priority: Medium  . S/P cholecystectomy 10/13/2017    Priority: Low  . Trigger finger 06/10/2016    Priority: Low  . Diverticulitis 04/08/2015    Priority: Low  . GERD (gastroesophageal reflux disease) 08/07/2012    Priority: Low  . Obesity     Priority: Low  . Diarrhea 05/11/2017    Medications- reviewed and  updated Current Outpatient Medications  Medication Sig Dispense Refill  . albuterol (PROVENTIL HFA;VENTOLIN HFA) 108 (90 Base) MCG/ACT inhaler Inhale 2 puffs into the lungs every 6 (six) hours as needed for wheezing or shortness of breath. 1 Inhaler 2  . aspirin EC 81 MG tablet Take 81 mg by mouth daily.    [name] atorvastatin (LIPITOR) 40 MG tablet Take 1 tablet (40 mg total) by mouth daily. 90 tablet 3  . calcitRIOL (ROCALTROL) 0.25 MCG capsule Take 1 capsule by mouth daily.    . carvedilol (COREG) 6.25 MG tablet Take 1 tablet (6.25 mg total) by mouth 2 (two) times daily with a meal. 180 tablet 3  . gabapentin (NEURONTIN) 100 MG capsule TAKE 2 CAPSULES BY MOUTH AT BEDTIME 180 capsule 1  . glucose blood (FREESTYLE TEST STRIPS) test strip Use to check blood sugar daily 100 each 3  . glucose monitoring kit (FREESTYLE) monitoring kit USE TO MONITOR BLOOD GLUCOSE AS DIRECTED 1 each 1  . Insulin Glargine (LANTUS SOLOSTAR) 100 UNIT/ML Solostar Pen Inject 18 Units into the skin daily.    . Insulin Pen Needle (UNIFINE PENTIPS) 31G X 5 MM MISC USE AS DIRECTED 100 each 0  . isosorbide mononitrate (IMDUR) 60 MG 24 hr tablet TAKE 1 TABLET BY MOUTH ONCE DAILY 90 tablet 2  . nitroGLYCERIN (NITROSTAT) 0.4 MG SL tablet Place 1 tablet (0.4 mg total) under  the tongue every 5 (five) minutes as needed for chest pain. 2 doses before calling for emergent help 25 tablet 3  . polyethylene glycol (MIRALAX / GLYCOLAX) packet Take 17 g by mouth daily. 14 each 0  . Polyethylene Glycol 400 (BLINK TEARS) 0.25 % SOLN Place 1 drop into both eyes 3 (three) times daily.    . sertraline (ZOLOFT) 100 MG tablet Take 1.5 tablets (150 mg total) by mouth daily. 135 tablet 1  . tamsulosin (FLOMAX) 0.4 MG CAPS capsule TAKE 1 CAPSULE BY MOUTH ONCE DAILY AFTER SUPPER 90 capsule 3  . triamcinolone cream (KENALOG) 0.1 % APPLY CREAM EXTERNALLY TWICE DAILY FOR 7 TO 10 DAYS MAXMIUM 80 g 0  . vitamin B-12 (CYANOCOBALAMIN) 1000 MCG tablet Take 1,000  mcg by mouth daily.    . furosemide (LASIX) 40 MG tablet Take 1 tablet (40 mg total) by mouth 2 (two) times daily. 60 tablet 6   No current facility-administered medications for this visit.     Objective: BP 136/84   Pulse 80   Temp 98.1 F (36.7 C) (Oral)   Ht '5\' 7"'$  (1.702 m)   Wt 204 lb 9.6 oz (92.8 kg) Comment: Without coat  SpO2 97%   BMI 32.04 kg/m  Gen: NAD, resting comfortably CV: RRR no murmurs rubs or gallops Lungs: CTAB no crackles, wheeze, rhonchi Ext: trace edema Skin: warm, dry Neuro: Speech normal, walks with cane  Assessment/Plan:  Other notes: 1.Stable 5 out of 10 right upper quadrant pain with yawning but 1 out of 10 most the time-worse when laying on opposite side for some reason 2. Some indigestion recently. tums helps.  With his kidney disease-could also trial Pepcid 10 to 20 mg every 48 hours 3. Trigger finger seems to be worse after surgery- binding left 2nd and 3rd finger together. Declines follow up with orthopedics-patient is concerned he would not be kind as he is very and pleased with his result after surgery- would see Dr. [name] back-in fact requests to see him again to see if there is anything else that can be done 4.BMI monitoring- elevated BMI noted: Body mass index is 32.04 kg/m. Encouraged need for healthy eating, regular activity-I do not want him to focus on weight loss given age and comorbidities 5.  Known COPD-stable with sparing albuterol-mainly when he gets really cold 6.  Complete heart block- pacemaker in place which is controlling this   Chronic systolic heart failure Chronic kidney disease stage IV s: Swelling remains reasonably controlled.  Unfortunately weight is up from last visit- actually only 2.6 lbs on repeat.  He is on 40 mg Lasix every other day and 40 mg Lasix once daily in between.  Creatinine seems to be doing well with current dose Wt Readings from Last 3 Encounters:  08/03/18 204 lb 9.6 oz (92.8 kg)  06/08/18 202 lb  (91.6 kg)  06/01/18 200 lb 3.2 oz (90.8 kg)   A/P:  Stable. Continue current medications.  Patient has done well with slightly less Lasix-appears largely stable-continue current medications.  Has follow-up with nephrology this afternoon  Hypertension S: controlled on carvedilol 6.25 mg twice daily, Lasix 40 mg twice daily alternating with daily, Imdur 60 mg  He had Afrin on his list- this should be avoided given hypertension BP Readings from Last 3 Encounters:  08/03/18 136/84  06/08/18 (!) 152/78  06/01/18 132/80  A/P: We discussed blood pressure goal of <140/90. Continue current meds   Hyperlipidemia  s:  controlled on atorvastatin 40  mg with last LDL at 65 A/P: Stable-continue current medications.  LDL excellent at last check in October 2019.  Recheck yearly  Controlled type 2 diabetes mellitus with stage 4 chronic kidney disease (HCC) S:  controlled on Lantus 18 units last visit.  Continues to do an excellent job with improved dietary intake-down from 60 units of Lantus at peak Lab Results  Component Value Date   HGBA1C 5.3 04/13/2018   HGBA1C 5.6 10/07/2017   HGBA1C 5.6 06/18/2017   A/P: Well controlled- decrease Lantus to 15 units.  Follow-up in 3 to 4 months  Depression, major, recurrent, moderate (HCC) S: Poorly controlled today on Zoloft 150 mg.  Patient never called for counseling-still has the pressure on his bed Depression screen [facility] 2/9 08/03/2018  Decreased Interest 2  Down, Depressed, Hopeless 1  PHQ - 2 Score 3  Altered sleeping 3  Tired, decreased energy 2  Change in appetite 1  Feeling bad or failure about yourself  3  Trouble concentrating 3  Moving slowly or fidgety/restless 2  Suicidal thoughts 0  PHQ-9 Score 17  Difficult doing work/chores Somewhat difficult  Some recent data might be hidden  A/P: Remains poorly controlled-continue Zoloft 150 mg-patient prefers not to increase dose of medication.  He does agree to call for counseling this  afternoon  Future Appointments  Date Time Provider [place]  08/11/2018  9:40 AM [name], DO [facility]  09/19/2018 12:50 PM [facility] DEVICE REMOTES [facility]  11/04/2018  9:40 AM Yong Channel [name], MD [facility]   Lab/Order associations: Controlled type 2 diabetes mellitus with stage 4 chronic kidney disease, with long-term current use of insulin ([place]) - Plan: POCT glycosylated hemoglobin (Hb K3T)  Chronic systolic CHF (congestive heart failure) (HCC)  Chronic kidney disease (CKD), stage IV (severe) (HCC)  Hyperlipidemia, unspecified hyperlipidemia type  Depression, major, recurrent, moderate ([place])  Return precautions advised.  Garret Reddish, MD

## 2018-08-03 NOTE — Assessment & Plan Note (Signed)
S: Poorly controlled today on Zoloft 150 mg.  Patient never called for counseling-still has the pressure on his bed Depression screen [facility] 2/9 08/03/2018  Decreased Interest 2  Down, Depressed, Hopeless 1  PHQ - 2 Score 3  Altered sleeping 3  Tired, decreased energy 2  Change in appetite 1  Feeling bad or failure about yourself  3  Trouble concentrating 3  Moving slowly or fidgety/restless 2  Suicidal thoughts 0  PHQ-9 Score 17  Difficult doing work/chores Somewhat difficult  Some recent data might be hidden  A/P: Remains poorly controlled-continue Zoloft 150 mg-patient prefers not to increase dose of medication.  He does agree to call for counseling this afternoon

## 2018-08-03 NOTE — Assessment & Plan Note (Signed)
S:  controlled on Lantus 18 units last visit.  Continues to do an excellent job with improved dietary intake-down from 60 units of Lantus at peak Lab Results  Component Value Date   HGBA1C 5.3 04/13/2018   HGBA1C 5.6 10/07/2017   HGBA1C 5.6 06/18/2017   A/P: Well controlled- decrease Lantus to 15 units.  Follow-up in 3 to 4 months

## 2018-08-03 NOTE — Patient Instructions (Addendum)
Please call [facility] this afternoon. Let us know if any worsening symptoms of depression  Decrease insulin to 15 units daily  Please schedule a visit with our excellent sports medicine physician Dr. [name] before you leave at the check out desk so he can further evaluate your trigger finger  See [place] back in 3 months and 1 day so we can recheck on your diabetes

## 2018-08-04 LAB — CUP PACEART REMOTE DEVICE CHECK
Battery Remaining Longevity: 21 mo
Battery Voltage: 2.94 V
Brady Statistic AP VS Percent: 0.04 %
Brady Statistic AS VS Percent: 0.02 %
Brady Statistic RA Percent Paced: 91.87 %
Date Time Interrogation Session: 20191209062403
HighPow Impedance: 68 Ohm
Implantable Lead Implant Date: 20100112
Implantable Lead Implant Date: 20170313
Implantable Lead Location: 753858
Implantable Lead Location: 753859
Implantable Lead Model: 4598
Implantable Pulse Generator Implant Date: 20170313
Lead Channel Impedance Value: 399 Ohm
Lead Channel Impedance Value: 475 Ohm
Lead Channel Impedance Value: 513 Ohm
Lead Channel Impedance Value: 646 Ohm
Lead Channel Impedance Value: 703 Ohm
Lead Channel Impedance Value: 703 Ohm
Lead Channel Pacing Threshold Amplitude: 0.5 V
Lead Channel Pacing Threshold Amplitude: 1.25 V
Lead Channel Pacing Threshold Pulse Width: 0.4 ms
Lead Channel Sensing Intrinsic Amplitude: 2.125 mV
Lead Channel Sensing Intrinsic Amplitude: 2.125 mV
Lead Channel Setting Pacing Amplitude: 3 V
Lead Channel Setting Pacing Pulse Width: 0.4 ms
MDC IDC LEAD IMPLANT DT: 20170313
MDC IDC LEAD LOCATION: 753860
MDC IDC MSMT LEADCHNL LV IMPEDANCE VALUE: 342 Ohm
MDC IDC MSMT LEADCHNL LV IMPEDANCE VALUE: 399 Ohm
MDC IDC MSMT LEADCHNL LV IMPEDANCE VALUE: 418 Ohm
MDC IDC MSMT LEADCHNL LV IMPEDANCE VALUE: 608 Ohm
MDC IDC MSMT LEADCHNL LV IMPEDANCE VALUE: 665 Ohm
MDC IDC MSMT LEADCHNL LV PACING THRESHOLD PULSEWIDTH: 0.4 ms
MDC IDC MSMT LEADCHNL RA IMPEDANCE VALUE: 399 Ohm
MDC IDC MSMT LEADCHNL RA PACING THRESHOLD AMPLITUDE: 1.5 V
MDC IDC MSMT LEADCHNL RV IMPEDANCE VALUE: 399 Ohm
MDC IDC MSMT LEADCHNL RV PACING THRESHOLD PULSEWIDTH: 0.4 ms
MDC IDC MSMT LEADCHNL RV SENSING INTR AMPL: 13.5 mV
MDC IDC MSMT LEADCHNL RV SENSING INTR AMPL: 13.5 mV
MDC IDC SET LEADCHNL LV PACING AMPLITUDE: 2.75 V
MDC IDC SET LEADCHNL LV PACING PULSEWIDTH: 0.4 ms
MDC IDC SET LEADCHNL RV PACING AMPLITUDE: 2.5 V
MDC IDC SET LEADCHNL RV SENSING SENSITIVITY: 0.3 mV
MDC IDC STAT BRADY AP VP PERCENT: 92.02 %
MDC IDC STAT BRADY AS VP PERCENT: 7.93 %
MDC IDC STAT BRADY RV PERCENT PACED: 99.72 %

## 2018-08-08 ENCOUNTER — Other Ambulatory Visit: Payer: Self-pay

## 2018-08-08 ENCOUNTER — Encounter: Payer: Self-pay | Admitting: Family Medicine

## 2018-08-08 MED ORDER — INSULIN GLARGINE 100 UNIT/ML SOLOSTAR PEN: 15.0000 [IU] | PEN_INJECTOR | Freq: Every day | SUBCUTANEOUS | 2 refills | Status: DC

## 2018-08-11 ENCOUNTER — Ambulatory Visit: Payer: Self-pay

## 2018-08-11 ENCOUNTER — Ambulatory Visit (INDEPENDENT_AMBULATORY_CARE_PROVIDER_SITE_OTHER): Payer: PPO | Admitting: Sports Medicine

## 2018-08-11 ENCOUNTER — Encounter: Payer: Self-pay | Admitting: Sports Medicine

## 2018-08-11 VITALS — BP 136/68 | HR 72 | Ht 67.0 in | Wt 204.2 lb

## 2018-08-11 DIAGNOSIS — M65332 Trigger finger, left middle finger: Secondary | ICD-10-CM

## 2018-08-11 NOTE — Patient Instructions (Signed)

## 2018-08-11 NOTE — Progress Notes (Signed)
Rodney Farley. Rodney Farley, Rodney at [place]  Fleetwood Farley - 85 y.o. male MRN [id number]  Date of birth: 1932-07-12  Visit Date: August 11, 2018  PCP: [name], MD   Referred by: [name], MD  SUBJECTIVE:  Chief Complaint  Patient presents with  . Follow-up    B trigger finger.  R trigger finger release w/ Weingold - 04/18/18.  Gabapentin 100 mg.    HPI: Patient is here for reevaluation of bilateral trigger finger.  He has been seen by Dr. Burney Gauze and underwent a left ring finger trigger finger release and [place].  Unfortunately he has been left with a chronic flexor contracture due to the duration of how long he has had these issues for.  He is displeased with the outcome of this and would not like to return to orthopedics because of his displeasure with the outcome but is overall happy with the office and has no significant concerns about the experience he is just frustrated by his underlying chronic issues.  He is interested if an injection but could potentially provide some benefit in this role and we spent extensive time discussing these options.  He has buddy taping his index finger and is middle finger together at night and this does seem to help with some of the pain that he is having.  He is content to leave this alone and does not wish for any further surgical intervention.  He does have significant amount of stress still and is seeing Dr. Edrick Oh with [facility] and is likely going to be needing dialysis at some point in the future.  This does cause him significant distress and he is reached out to Mirna Mires for establishing counseling which should be beneficial.  He does report that the happiness and Phineas Semen that he has today does seem to be in front that he is quite lonely and typically spends a week at a time by himself without any other day-to-day interaction.  He does recognize that  dialysis may be beneficial for this as well.   REVIEW OF SYSTEMS: Per HPI   HISTORY:  Prior history reviewed and updated per electronic medical record.  Social History   Occupational History  . Occupation: Retired  Tobacco Use  . Smoking status: Former Smoker    Packs/day: 1.50    Years: 54.00    Pack years: 81.00    Types: Cigarettes    Last attempt to quit: 07/18/2007    Years since quitting: 11.0  . Smokeless tobacco: Never Used  . Tobacco comment: did not discuss LdCT but being evaluated for chole currently  Substance and Sexual Activity  . Alcohol use: Yes    Comment: 04/08/2015 "probably 3 drinks/month"  . Drug use: No  . Sexual activity: Never   Social History   Social History Narrative   Married 1994 (together since 1989) 1 son, 1 stepson. 3 grandkids, 6 great grandkids      Retired from [employer]. Had 2 years of collge.       Faith: [employer]    DATA OBTAINED & REVIEWED:  Recent Labs    10/07/17 0952 10/14/17 0513 10/15/17 0624  11/15/17 1410 11/23/17 1141 12/27/17 04/13/18 0909 04/13/18 0926 08/03/18 1137  HGBA1C 5.6  --   --   --   --   --   --  5.3  --  5.3  CALCIUM 8.8* 8.0* 8.4*   < >  8.9 8.7  --   --  8.6  --   MG  --  1.4* 1.8  --   --   --   --   --   --   --   AST  --   --   --   --   --   --  11*  --  14  --   ALT  --   --   --   --   --   --  13  --  16  --   TSH  --   --   --   --   --   --   --   --  1.24  --    < > = values in this interval not displayed.    No problems updated. No specialty comments available.   OBJECTIVE:  VS:  HT:5\' 7"  (170.2 cm)   WT:204 lb 3.2 oz (92.6 kg)  BMI:31.97    BP:136/68  HR:72bpm  TEMP: ( )  RESP:98 %   PHYSICAL EXAM: Adult male.  Hard of hearing.  His hearing aid stopped working today.  He is quite happy appearing which is significantly improved compared to the past.  Unfortunately he does have a significant of hypertrophic scarring over the third A1 pulley of the left hand.  He has a  chronic flexor contracture of the right hand.  MSK ultrasound does show significant thickening of the common flexor tendon.  There is underlying tendon sheath inflammation with hypoechoic change.  ASSESSMENT   1. Trigger middle finger of left hand     PROCEDURES:  US Guided Injection per procedure note      PLAN:  Pertinent additional documentation may be included in corresponding procedure notes, imaging studies, problem based documentation and patient instructions.  No problem-specific Assessment & Plan notes found for this encounter.   Ultimately he does have chronic trigger finger of bilateral middle fingers and has ultimately failed trigger finger release on the left.  He is not interested in any further surgical intervention or returning to occupational therapy.  Ultimately we discussed repeating an injection today to see if this can provide at least some short-term benefit and may help with some of the scar tissue remodeling.  He does understand that repeat cortisone injections are not ideal.  >50% of this 25 minute visit spent in direct patient counseling and/or coordination of care.  Discussion was focused on education regarding the in discussing the pathoetiology and anticipated clinical course of the above condition.  Continue with buddy taping and showed him how to actually tape his middle and ring fingers together as this may provide additional splinting factor for him.  Activity modifications and the importance of avoiding exacerbating activities (limiting pain to no more than a 4 / 10 during or following activity) recommended and discussed.  Discussed red flag symptoms that warrant earlier emergent evaluation and patient voices understanding.   No orders of the defined types were placed in this encounter.  Lab Orders  No laboratory test(s) ordered today    Imaging Orders     [place] MSK POCT ULTRASOUND Referral Orders  No referral(s) requested today    Return if  symptoms worsen or fail to improve.          [name], [facility] Physician

## 2018-08-11 NOTE — Procedures (Signed)
PROCEDURE NOTE:  Ultrasound Guided: Injection: Left Third finger, middle finger, trigger finger, Tendon Sheath Images were obtained and interpreted by myself, Teresa Coombs, DO  Images have been saved and stored to PACS system. Images obtained on: GE S7 Ultrasound machine    ULTRASOUND FINDINGS:  Marked thickening of the A1 pulley with overlying change.  DESCRIPTION OF PROCEDURE:  [name] clinical condition is marked by substantial pain and/or significant functional disability. Other conservative therapy has not provided relief, is contraindicated, or not appropriate. There is a reasonable likelihood that injection will significantly improve the patient's pain and/or functional impairment.   After discussing the risks, benefits and expected outcomes of the injection and all questions were reviewed and answered, the patient wished to undergo the above named procedure.  Verbal consent was obtained.  The ultrasound was used to identify the target structure and adjacent neurovascular structures. The skin was then prepped in sterile fashion and the target structure was injected under direct visualization using sterile technique as below:  Single injection performed as below: PREP: Alcohol, Ethel Chloride and 1 cc 1% lidocaine on Insulin Needle APPROACH:direct, single injection, 25g 1.5 in. INJECTATE: 0.5 cc 1% lidocaine, 0.5 cc 0.5% Marcaine and 0.5 cc 40mg /mL DepoMedrol ASPIRATE: None DRESSING: Band-Aid  Post procedural instructions including recommending icing and warning signs for infection were reviewed.    This procedure was well tolerated and there were no complications.   IMPRESSION: Succesful Ultrasound Guided: Injection

## 2018-08-22 DIAGNOSIS — Z961 Presence of intraocular lens: Secondary | ICD-10-CM | POA: Diagnosis not present

## 2018-08-22 DIAGNOSIS — H04123 Dry eye syndrome of bilateral lacrimal glands: Secondary | ICD-10-CM | POA: Diagnosis not present

## 2018-08-22 DIAGNOSIS — H35372 Puckering of macula, left eye: Secondary | ICD-10-CM | POA: Diagnosis not present

## 2018-08-22 DIAGNOSIS — H16212 Exposure keratoconjunctivitis, left eye: Secondary | ICD-10-CM | POA: Diagnosis not present

## 2018-08-22 LAB — HM DIABETES EYE EXAM

## 2018-08-22 NOTE — Discharge Instructions (Signed)
Epoetin Alfa injection °What is this medicine? °EPOETIN ALFA (e POE e tin AL fa) helps your body make more red blood cells. This medicine is used to treat anemia caused by chronic kidney disease, cancer chemotherapy, or HIV-therapy. It may also be used before surgery if you have anemia. °This medicine may be used for other purposes; ask your health care provider or pharmacist if you have questions. °COMMON BRAND NAME(S): Epogen, Procrit, Retacrit °What should I tell my health care provider before I take this medicine? °They need to know if you have any of these conditions: °-cancer °-heart disease °-high blood pressure °-history of blood clots °-history of stroke °-low levels of folate, iron, or vitamin B12 in the blood °-seizures °-an unusual or allergic reaction to erythropoietin, albumin, benzyl alcohol, hamster proteins, other medicines, foods, dyes, or preservatives °-pregnant or trying to get pregnant °-breast-feeding °How should I use this medicine? °This medicine is for injection into a vein or under the skin. It is usually given by a health care professional in a hospital or clinic setting. °If you get this medicine at home, you will be taught how to prepare and give this medicine. Use exactly as directed. Take your medicine at regular intervals. Do not take your medicine more often than directed. °It is important that you put your used needles and syringes in a special sharps container. Do not put them in a trash can. If you do not have a sharps container, call your pharmacist or healthcare provider to get one. °A special MedGuide will be given to you by the pharmacist with each prescription and refill. Be sure to read this information carefully each time. °Talk to your pediatrician regarding the use of this medicine in children. While this drug may be prescribed for selected conditions, precautions do apply. °Overdosage: If you think you have taken too much of this medicine contact a poison control center  or emergency room at once. °NOTE: This medicine is only for you. Do not share this medicine with others. °What if I miss a dose? °If you miss a dose, take it as soon as you can. If it is almost time for your next dose, take only that dose. Do not take double or extra doses. °What may interact with this medicine? °Interactions have not been studied. °This list may not describe all possible interactions. Give your health care provider a list of all the medicines, herbs, non-prescription drugs, or dietary supplements you use. Also tell them if you smoke, drink alcohol, or use illegal drugs. Some items may interact with your medicine. °What should I watch for while using this medicine? °Your condition will be monitored carefully while you are receiving this medicine. °You may need blood work done while you are taking this medicine. °This medicine may cause a decrease in vitamin B6. You should make sure that you get enough vitamin B6 while you are taking this medicine. Discuss the foods you eat and the vitamins you take with your health care professional. °What side effects may I notice from receiving this medicine? °Side effects that you should report to your doctor or health care professional as soon as possible: °-allergic reactions like skin rash, itching or hives, swelling of the face, lips, or tongue °-seizures °-signs and symptoms of a blood clot such as breathing problems; changes in vision; chest pain; severe, sudden headache; pain, swelling, warmth in the leg; trouble speaking; sudden numbness or weakness of the face, arm or leg °-signs and symptoms of a stroke   like changes in vision; confusion; trouble speaking or understanding; severe headaches; sudden numbness or weakness of the face, arm or leg; trouble walking; dizziness; loss of balance or coordination °Side effects that usually do not require medical attention (report to your doctor or health care professional if they continue or are  bothersome): °-chills °-cough °-dizziness °-fever °-headaches °-joint pain °-muscle cramps °-muscle pain °-nausea, vomiting °-pain, redness, or irritation at site where injected °This list may not describe all possible side effects. Call your doctor for medical advice about side effects. You may report side effects to FDA at 1-800-FDA-1088. °Where should I keep my medicine? °Keep out of the reach of children. °Store in a refrigerator between 2 and 8 degrees C (36 and 46 degrees F). Do not freeze or shake. Throw away any unused portion if using a single-dose vial. Multi-dose vials can be kept in the refrigerator for up to 21 days after the initial dose. Throw away unused medicine. °NOTE: This sheet is a summary. It may not cover all possible information. If you have questions about this medicine, talk to your doctor, pharmacist, or health care provider. °© 2019 Elsevier/Gold Standard (2017-02-05 08:35:19) ° °

## 2018-08-23 ENCOUNTER — Ambulatory Visit (HOSPITAL_COMMUNITY)
Admission: RE | Admit: 2018-08-23 | Discharge: 2018-08-23 | Disposition: A | Discharge status: Home / Self Care | Payer: PPO | Source: Ambulatory Visit | Attending: Nephrology | Admitting: Nephrology

## 2018-08-23 VITALS — BP 151/74 | HR 71 | Resp 18

## 2018-08-23 DIAGNOSIS — N184 Chronic kidney disease, stage 4 (severe): Secondary | ICD-10-CM | POA: Diagnosis not present

## 2018-08-23 LAB — POCT HEMOGLOBIN-HEMACUE: Hemoglobin: 9.8 g/dL — ABNORMAL LOW (ref 13.0–17.0)

## 2018-08-23 MED ORDER — EPOETIN ALFA 10000 UNIT/ML IJ SOLN
10000.0000 [IU] | INTRAMUSCULAR | Status: DC
  Administered 2018-08-23: 10000 [IU] via SUBCUTANEOUS

## 2018-08-23 MED ORDER — EPOETIN ALFA 10000 UNIT/ML IJ SOLN
INTRAMUSCULAR | Status: AC
  Administered 2018-08-23: 10000 [IU] via SUBCUTANEOUS
  Filled 2018-08-23: qty 1

## 2018-08-25 ENCOUNTER — Encounter: Payer: Self-pay | Admitting: Family Medicine

## 2018-08-30 ENCOUNTER — Telehealth: Payer: Self-pay

## 2018-08-30 ENCOUNTER — Encounter (HOSPITAL_COMMUNITY)
Admission: RE | Admit: 2018-08-30 | Discharge: 2018-08-30 | Disposition: A | Discharge status: Home / Self Care | Payer: PPO | Source: Ambulatory Visit | Attending: Nephrology | Admitting: Nephrology

## 2018-08-30 VITALS — BP 161/59 | HR 70 | Temp 98.1°F | Resp 20

## 2018-08-30 DIAGNOSIS — N184 Chronic kidney disease, stage 4 (severe): Secondary | ICD-10-CM | POA: Insufficient documentation

## 2018-08-30 MED ORDER — EPOETIN ALFA 10000 UNIT/ML IJ SOLN
INTRAMUSCULAR | Status: AC
  Administered 2018-08-30: 10000 [IU] via SUBCUTANEOUS
  Filled 2018-08-30: qty 1

## 2018-08-30 MED ORDER — EPOETIN ALFA 10000 UNIT/ML IJ SOLN
10000.0000 [IU] | INTRAMUSCULAR | Status: DC
  Administered 2018-08-30: 10000 [IU] via SUBCUTANEOUS

## 2018-08-30 NOTE — Telephone Encounter (Signed)
Pt stopped by the office today to drop off parking Placard. Form is ready for pick up. Pt notified via vm and mychart message.

## 2018-08-30 NOTE — Telephone Encounter (Signed)
Entered in error

## 2018-08-31 LAB — POCT HEMOGLOBIN-HEMACUE: Hemoglobin: 9.7 g/dL — ABNORMAL LOW (ref 13.0–17.0)

## 2018-09-06 ENCOUNTER — Ambulatory Visit (HOSPITAL_COMMUNITY)
Admission: RE | Admit: 2018-09-06 | Discharge: 2018-09-06 | Disposition: A | Discharge status: Home / Self Care | Payer: PPO | Source: Ambulatory Visit | Attending: Nephrology | Admitting: Nephrology

## 2018-09-06 VITALS — BP 155/81 | HR 73 | Resp 20

## 2018-09-06 DIAGNOSIS — N184 Chronic kidney disease, stage 4 (severe): Secondary | ICD-10-CM | POA: Diagnosis not present

## 2018-09-06 LAB — POCT HEMOGLOBIN-HEMACUE: Hemoglobin: 10.1 g/dL — ABNORMAL LOW (ref 13.0–17.0)

## 2018-09-06 MED ORDER — EPOETIN ALFA 10000 UNIT/ML IJ SOLN
INTRAMUSCULAR | Status: AC
  Administered 2018-09-06: 10000 [IU]
  Filled 2018-09-06: qty 1

## 2018-09-06 MED ORDER — EPOETIN ALFA 10000 UNIT/ML IJ SOLN: 10000.0000 [IU] | INTRAMUSCULAR | Status: DC

## 2018-09-13 ENCOUNTER — Ambulatory Visit (INDEPENDENT_AMBULATORY_CARE_PROVIDER_SITE_OTHER): Payer: PPO | Admitting: Family Medicine

## 2018-09-13 ENCOUNTER — Encounter: Payer: Self-pay | Admitting: Family Medicine

## 2018-09-13 ENCOUNTER — Encounter (HOSPITAL_COMMUNITY): Payer: PPO

## 2018-09-13 VITALS — BP 138/68 | HR 71 | Temp 98.1°F | Ht 67.0 in | Wt 216.0 lb

## 2018-09-13 DIAGNOSIS — R509 Fever, unspecified: Secondary | ICD-10-CM | POA: Diagnosis not present

## 2018-09-13 DIAGNOSIS — Z794 Long term (current) use of insulin: Secondary | ICD-10-CM | POA: Diagnosis not present

## 2018-09-13 DIAGNOSIS — I10 Essential (primary) hypertension: Secondary | ICD-10-CM

## 2018-09-13 DIAGNOSIS — E1122 Type 2 diabetes mellitus with diabetic chronic kidney disease: Secondary | ICD-10-CM

## 2018-09-13 DIAGNOSIS — I5022 Chronic systolic (congestive) heart failure: Secondary | ICD-10-CM | POA: Diagnosis not present

## 2018-09-13 DIAGNOSIS — N184 Chronic kidney disease, stage 4 (severe): Secondary | ICD-10-CM

## 2018-09-13 DIAGNOSIS — J441 Chronic obstructive pulmonary disease with (acute) exacerbation: Secondary | ICD-10-CM | POA: Diagnosis not present

## 2018-09-13 LAB — POCT INFLUENZA A/B
INFLUENZA B, POC: NEGATIVE
Influenza A, POC: NEGATIVE

## 2018-09-13 MED ORDER — LANSOPRAZOLE 15 MG PO CPDR: 15.0000 mg | DELAYED_RELEASE_CAPSULE | Freq: Every day | ORAL | 3 refills | Status: DC

## 2018-09-13 MED ORDER — DOXYCYCLINE HYCLATE 100 MG PO TABS: 100.0000 mg | ORAL_TABLET | Freq: Two times a day (BID) | ORAL | 0 refills | Status: AC

## 2018-09-13 NOTE — Patient Instructions (Addendum)
Lets treat this COPD flare up with doxycycline to treat any bacterial component. I think prednisone would help but with hallucination history lets start with antibiotic alone and if not doing better in 2 days try a low dose prednisone- go to hospital if symptoms worsen

## 2018-09-13 NOTE — Progress Notes (Signed)
Phone [phone]   Subjective:  Rodney Farley is a 85 y.o. year old very pleasant male patient who presents for/with See problem oriented charting ROS- has had fever. No chest pain. Shortness of breath and wheezing noted- albuterol seems helpful. Edema slightly increased on exam though patient hadn't noted   Past Medical History-  Patient Active Problem List   Diagnosis Date Noted  . Ruptured appendicitis 03/26/2017    Priority: High  . Marital stress 02/18/2017    Priority: High  . COPD (chronic obstructive pulmonary disease) ([place])     Priority: High  . Chronic systolic CHF (congestive heart failure) ([place]) 04/08/2015    Priority: High  . DOE (dyspnea on exertion), due to significant CAD 03/28/2015    Priority: High  . Chronic kidney disease (CKD), stage IV (severe) ([place]) 03/04/2015    Priority: High  . Coronary artery disease involving native coronary artery of native heart with angina pectoris ([place]) 09/13/2014    Priority: High  . Depression, major, recurrent, moderate ([place]) 08/05/2012    Priority: High  . Controlled type 2 diabetes mellitus with stage 4 chronic kidney disease ([place]) 08/05/2012    Priority: High  . Ischemic cardiomyopathy 11/03/2011    Priority: High  . Atrioventricular block, complete (HCC)     Priority: High  . Pacemaker     Priority: High  . Hyperlipidemia 04/13/2018    Priority: Medium  . BPH associated with nocturia 04/27/2016    Priority: Medium  . Diabetic peripheral neuropathy (HCC)     Priority: Medium  . Bell's palsy     Priority: Medium  . Gout 06/26/2015    Priority: Medium  . Sleep apnea 08/05/2012    Priority: Medium  . HTN (hypertension) 08/05/2012    Priority: Medium  . S/P cholecystectomy 10/13/2017    Priority: Low  . Trigger finger 06/10/2016    Priority: Low  . Diverticulitis 04/08/2015    Priority: Low  . GERD (gastroesophageal reflux disease) 08/07/2012    Priority: Low  . Obesity     Priority: Low  .  Diarrhea 05/11/2017    Medications- reviewed and updated Current Outpatient Medications  Medication Sig Dispense Refill  . albuterol (PROVENTIL HFA;VENTOLIN HFA) 108 (90 Base) MCG/ACT inhaler Inhale 2 puffs into the lungs every 6 (six) hours as needed for wheezing or shortness of breath. 1 Inhaler 2  . aspirin EC 81 MG tablet Take 81 mg by mouth daily.    [name] atorvastatin (LIPITOR) 40 MG tablet Take 1 tablet (40 mg total) by mouth daily. 90 tablet 3  . calcitRIOL (ROCALTROL) 0.25 MCG capsule Take 1 capsule by mouth daily.    . carvedilol (COREG) 6.25 MG tablet Take 1 tablet (6.25 mg total) by mouth 2 (two) times daily with a meal. 180 tablet 3  . Epoetin Alfa (PROCRIT IJ) Inject as directed. Through Dr. Justin Mend    . gabapentin (NEURONTIN) 100 MG capsule TAKE 2 CAPSULES BY MOUTH AT BEDTIME 180 capsule 1  . glucose blood (FREESTYLE TEST STRIPS) test strip Use to check blood sugar daily 100 each 3  . glucose monitoring kit (FREESTYLE) monitoring kit USE TO MONITOR BLOOD GLUCOSE AS DIRECTED 1 each 1  . Insulin Glargine (LANTUS SOLOSTAR) 100 UNIT/ML Solostar Pen Inject 15 Units into the skin daily. 15 mL 2  . Insulin Pen Needle (UNIFINE PENTIPS) 31G X 5 MM MISC USE AS DIRECTED 100 each 0  . isosorbide mononitrate (IMDUR) 60 MG 24 hr tablet TAKE 1 TABLET BY  MOUTH ONCE DAILY 90 tablet 2  . lansoprazole (PREVACID) 15 MG capsule Take 1 capsule (15 mg total) by mouth daily at 12 noon. 90 capsule 3  . nitroGLYCERIN (NITROSTAT) 0.4 MG SL tablet Place 1 tablet (0.4 mg total) under the tongue every 5 (five) minutes as needed for chest pain. 2 doses before calling for emergent help 25 tablet 3  . polyethylene glycol (MIRALAX / GLYCOLAX) packet Take 17 g by mouth daily. 14 each 0  . Polyethylene Glycol 400 (BLINK TEARS) 0.25 % SOLN Place 1 drop into both eyes 3 (three) times daily.    . sertraline (ZOLOFT) 100 MG tablet Take 1.5 tablets (150 mg total) by mouth daily. 135 tablet 1  . tamsulosin (FLOMAX) 0.4 MG CAPS  capsule TAKE 1 CAPSULE BY MOUTH ONCE DAILY AFTER SUPPER 90 capsule 3  . triamcinolone cream (KENALOG) 0.1 % APPLY CREAM EXTERNALLY TWICE DAILY FOR 7 TO 10 DAYS MAXMIUM 80 g 0  . vitamin B-12 (CYANOCOBALAMIN) 1000 MCG tablet Take 1,000 mcg by mouth daily.    [name] doxycycline (VIBRA-TABS) 100 MG tablet Take 1 tablet (100 mg total) by mouth 2 (two) times daily for 7 days. 14 tablet 0  . furosemide (LASIX) 40 MG tablet Take 1 tablet (40 mg total) by mouth 2 (two) times daily. 60 tablet 6   No current facility-administered medications for this visit.      Objective:  BP 138/68   Pulse 71   Temp 98.1 F (36.7 C) (Oral) Comment: with medication  Ht '5\' 7"'  (1.702 m)   Wt 216 lb (98 kg)   SpO2 94%   BMI 33.83 kg/m  Gen: NAD, resting comfortably, wearing mask CV: RRR no murmurs rubs or gallops Lungs: mild diffuse intermittent wheeze. no crackles, rhonchi Abdomen: soft/nontender/nondistended Ext: 1+ edema Skin: warm, dry    Assessment and Plan   COPD exacerbation S: Symptoms Started thurs night/friday morning Started with head cold with sore throat and nasal congestion Has had a lot of chest congestion A lot of phlegm- whitish discoloration Some baseline smokers cough Got him a cough medicine also with acetaminophen Yesterday felt worst Threw up once last night- no blood or bile in emesis No new diarrhea- reports always having some issues Breathing more shallow than usual/quicker Wheezing more than normal Temperature at home this morning up to 100.7, got a new thermometer and it was 97.8 (but had tylenol)  Did get back from [facility] on Tuesday- was in [place] airport but no known sick contacts- particularly no confirmed covid-19 contacts  A/P: Appears to have started as possible URI/sinusitis - now with increased sputum production/chest congestion/wheeze concerning for COPD exacerbation. Flu test negative.  - did not meet criteria per [place] guidelines for COVID-19  testing though with travel to [facility] state and lower respiratory symptoms I do have some concern - he has hallucinations on prednisone in past (though may have been higher dose) - will try doxycycline alone and if not improving perhaps low dose prednisone 10 mg - we worked patient in today late in the day- our lab was not available at time of visit- with weight gain and edema would strongly consider bmp/bnp if not improving as well- fluid overload could contribute to respiratory symptoms as well - discussed going to hospital if symptoms worsen - continue current dose of lasix for now  Last a1c was excellent- suspect we can use prednisone if needed.   Initial BP was elevated in regards to hypertension- improved on repeat-  continue current medicine  No problem-specific Assessment & Plan notes found for this encounter.   Future Appointments  Date Time Provider [place]  09/19/2018 12:50 PM [facility] DEVICE REMOTES [facility]  09/20/2018  2:45 PM [facility] INJECTION ROOM [facility] None  11/04/2018  9:40 AM Algie Cales, [name], MD [facility]   No follow-ups on file.  Lab/Order associations: Fever, unspecified fever cause - Plan: POC Influenza A/B  Meds ordered this encounter  Medications  . lansoprazole (PREVACID) 15 MG capsule    Sig: Take 1 capsule (15 mg total) by mouth daily at 12 noon.    Dispense:  90 capsule    Refill:  3  . doxycycline (VIBRA-TABS) 100 MG tablet    Sig: Take 1 tablet (100 mg total) by mouth 2 (two) times daily for 7 days.    Dispense:  14 tablet    Refill:  0    Return precautions advised.  Garret Reddish, MD

## 2018-09-16 ENCOUNTER — Encounter: Payer: Self-pay | Admitting: Family Medicine

## 2018-09-19 ENCOUNTER — Ambulatory Visit (INDEPENDENT_AMBULATORY_CARE_PROVIDER_SITE_OTHER): Payer: PPO | Admitting: *Deleted

## 2018-09-19 DIAGNOSIS — I442 Atrioventricular block, complete: Secondary | ICD-10-CM | POA: Diagnosis not present

## 2018-09-20 ENCOUNTER — Other Ambulatory Visit: Payer: Self-pay | Admitting: Family Medicine

## 2018-09-20 ENCOUNTER — Other Ambulatory Visit: Payer: Self-pay

## 2018-09-20 ENCOUNTER — Ambulatory Visit (HOSPITAL_COMMUNITY)
Admission: RE | Admit: 2018-09-20 | Discharge: 2018-09-20 | Disposition: A | Discharge status: Home / Self Care | Payer: PPO | Source: Ambulatory Visit | Attending: Nephrology | Admitting: Nephrology

## 2018-09-20 VITALS — BP 146/71 | HR 80 | Temp 98.1°F | Resp 20

## 2018-09-20 DIAGNOSIS — N184 Chronic kidney disease, stage 4 (severe): Secondary | ICD-10-CM | POA: Insufficient documentation

## 2018-09-20 LAB — CUP PACEART REMOTE DEVICE CHECK
Battery Remaining Longevity: 19 mo
Battery Voltage: 2.92 V
Brady Statistic AP VP Percent: 84.49 %
Brady Statistic AP VS Percent: 0.07 %
Brady Statistic AS VP Percent: 15.35 %
Brady Statistic AS VS Percent: 0.09 %
Brady Statistic RA Percent Paced: 83.86 %
Brady Statistic RV Percent Paced: 99.54 %
Date Time Interrogation Session: 20200309112305
HighPow Impedance: 68 Ohm
Implantable Lead Implant Date: 20100112
Implantable Lead Implant Date: 20170313
Implantable Lead Implant Date: 20170313
Implantable Lead Location: 753858
Implantable Lead Location: 753859
Implantable Lead Location: 753860
Implantable Lead Model: 4598
Implantable Lead Model: 5076
Implantable Pulse Generator Implant Date: 20170313
Lead Channel Impedance Value: 342 Ohm
Lead Channel Impedance Value: 342 Ohm
Lead Channel Impedance Value: 361 Ohm
Lead Channel Impedance Value: 399 Ohm
Lead Channel Impedance Value: 399 Ohm
Lead Channel Impedance Value: 418 Ohm
Lead Channel Impedance Value: 456 Ohm
Lead Channel Impedance Value: 456 Ohm
Lead Channel Impedance Value: 646 Ohm
Lead Channel Impedance Value: 665 Ohm
Lead Channel Impedance Value: 665 Ohm
Lead Channel Impedance Value: 703 Ohm
Lead Channel Impedance Value: 703 Ohm
Lead Channel Pacing Threshold Amplitude: 0.5 V
Lead Channel Pacing Threshold Amplitude: 1.125 V
Lead Channel Pacing Threshold Amplitude: 1.375 V
Lead Channel Pacing Threshold Pulse Width: 0.4 ms
Lead Channel Pacing Threshold Pulse Width: 0.4 ms
Lead Channel Pacing Threshold Pulse Width: 0.4 ms
Lead Channel Sensing Intrinsic Amplitude: 1.375 mV
Lead Channel Sensing Intrinsic Amplitude: 1.375 mV
Lead Channel Sensing Intrinsic Amplitude: 13.5 mV
Lead Channel Sensing Intrinsic Amplitude: 13.5 mV
Lead Channel Setting Pacing Amplitude: 2.5 V
Lead Channel Setting Pacing Amplitude: 2.75 V
Lead Channel Setting Pacing Amplitude: 2.75 V
Lead Channel Setting Pacing Pulse Width: 0.4 ms
Lead Channel Setting Pacing Pulse Width: 0.4 ms
Lead Channel Setting Sensing Sensitivity: 0.3 mV

## 2018-09-20 LAB — IRON AND TIBC
Iron: 62 ug/dL (ref 45–182)
Saturation Ratios: 20 % (ref 17.9–39.5)
TIBC: 311 ug/dL (ref 250–450)
UIBC: 249 ug/dL

## 2018-09-20 LAB — FERRITIN: Ferritin: 51 ng/mL (ref 24–336)

## 2018-09-20 LAB — POCT HEMOGLOBIN-HEMACUE: Hemoglobin: 10 g/dL — ABNORMAL LOW (ref 13.0–17.0)

## 2018-09-20 MED ORDER — EPOETIN ALFA 10000 UNIT/ML IJ SOLN
INTRAMUSCULAR | Status: AC
  Administered 2018-09-20: 10000 [IU] via SUBCUTANEOUS
  Filled 2018-09-20: qty 1

## 2018-09-20 MED ORDER — EPOETIN ALFA 10000 UNIT/ML IJ SOLN
10000.0000 [IU] | INTRAMUSCULAR | Status: DC
  Administered 2018-09-20: 10000 [IU] via SUBCUTANEOUS

## 2018-09-20 NOTE — Discharge Instructions (Signed)
Epoetin Alfa injection °What is this medicine? °EPOETIN ALFA (e POE e tin AL fa) helps your body make more red blood cells. This medicine is used to treat anemia caused by chronic kidney disease, cancer chemotherapy, or HIV-therapy. It may also be used before surgery if you have anemia. °This medicine may be used for other purposes; ask your health care provider or pharmacist if you have questions. °COMMON BRAND NAME(S): Epogen, Procrit, Retacrit °What should I tell my health care provider before I take this medicine? °They need to know if you have any of these conditions: °-cancer °-heart disease °-high blood pressure °-history of blood clots °-history of stroke °-low levels of folate, iron, or vitamin B12 in the blood °-seizures °-an unusual or allergic reaction to erythropoietin, albumin, benzyl alcohol, hamster proteins, other medicines, foods, dyes, or preservatives °-pregnant or trying to get pregnant °-breast-feeding °How should I use this medicine? °This medicine is for injection into a vein or under the skin. It is usually given by a health care professional in a hospital or clinic setting. °If you get this medicine at home, you will be taught how to prepare and give this medicine. Use exactly as directed. Take your medicine at regular intervals. Do not take your medicine more often than directed. °It is important that you put your used needles and syringes in a special sharps container. Do not put them in a trash can. If you do not have a sharps container, call your pharmacist or healthcare provider to get one. °A special MedGuide will be given to you by the pharmacist with each prescription and refill. Be sure to read this information carefully each time. °Talk to your pediatrician regarding the use of this medicine in children. While this drug may be prescribed for selected conditions, precautions do apply. °Overdosage: If you think you have taken too much of this medicine contact a poison control center  or emergency room at once. °NOTE: This medicine is only for you. Do not share this medicine with others. °What if I miss a dose? °If you miss a dose, take it as soon as you can. If it is almost time for your next dose, take only that dose. Do not take double or extra doses. °What may interact with this medicine? °Interactions have not been studied. °This list may not describe all possible interactions. Give your health care provider a list of all the medicines, herbs, non-prescription drugs, or dietary supplements you use. Also tell them if you smoke, drink alcohol, or use illegal drugs. Some items may interact with your medicine. °What should I watch for while using this medicine? °Your condition will be monitored carefully while you are receiving this medicine. °You may need blood work done while you are taking this medicine. °This medicine may cause a decrease in vitamin B6. You should make sure that you get enough vitamin B6 while you are taking this medicine. Discuss the foods you eat and the vitamins you take with your health care professional. °What side effects may I notice from receiving this medicine? °Side effects that you should report to your doctor or health care professional as soon as possible: °-allergic reactions like skin rash, itching or hives, swelling of the face, lips, or tongue °-seizures °-signs and symptoms of a blood clot such as breathing problems; changes in vision; chest pain; severe, sudden headache; pain, swelling, warmth in the leg; trouble speaking; sudden numbness or weakness of the face, arm or leg °-signs and symptoms of a stroke   like changes in vision; confusion; trouble speaking or understanding; severe headaches; sudden numbness or weakness of the face, arm or leg; trouble walking; dizziness; loss of balance or coordination °Side effects that usually do not require medical attention (report to your doctor or health care professional if they continue or are  bothersome): °-chills °-cough °-dizziness °-fever °-headaches °-joint pain °-muscle cramps °-muscle pain °-nausea, vomiting °-pain, redness, or irritation at site where injected °This list may not describe all possible side effects. Call your doctor for medical advice about side effects. You may report side effects to FDA at 1-800-FDA-1088. °Where should I keep my medicine? °Keep out of the reach of children. °Store in a refrigerator between 2 and 8 degrees C (36 and 46 degrees F). Do not freeze or shake. Throw away any unused portion if using a single-dose vial. Multi-dose vials can be kept in the refrigerator for up to 21 days after the initial dose. Throw away unused medicine. °NOTE: This sheet is a summary. It may not cover all possible information. If you have questions about this medicine, talk to your doctor, pharmacist, or health care provider. °© 2019 Elsevier/Gold Standard (2017-02-05 08:35:19) ° °

## 2018-09-27 ENCOUNTER — Ambulatory Visit (HOSPITAL_COMMUNITY)
Admission: RE | Admit: 2018-09-27 | Discharge: 2018-09-27 | Disposition: A | Discharge status: Home / Self Care | Payer: PPO | Source: Ambulatory Visit | Attending: Nephrology | Admitting: Nephrology

## 2018-09-27 ENCOUNTER — Other Ambulatory Visit: Payer: Self-pay

## 2018-09-27 ENCOUNTER — Encounter (HOSPITAL_COMMUNITY): Payer: PPO

## 2018-09-27 VITALS — BP 171/78 | HR 82 | Temp 97.7°F | Resp 20

## 2018-09-27 DIAGNOSIS — R6889 Other general symptoms and signs: Secondary | ICD-10-CM

## 2018-09-27 DIAGNOSIS — N184 Chronic kidney disease, stage 4 (severe): Secondary | ICD-10-CM | POA: Diagnosis not present

## 2018-09-27 MED ORDER — EPOETIN ALFA 10000 UNIT/ML IJ SOLN
10000.0000 [IU] | INTRAMUSCULAR | Status: DC
  Administered 2018-09-27: 10000 [IU] via SUBCUTANEOUS

## 2018-09-27 MED ORDER — EPOETIN ALFA 10000 UNIT/ML IJ SOLN
INTRAMUSCULAR | Status: AC
  Administered 2018-09-27: 10000 [IU] via SUBCUTANEOUS
  Filled 2018-09-27: qty 1

## 2018-09-27 NOTE — Progress Notes (Signed)
Remote ICD transmission.   

## 2018-09-27 NOTE — Progress Notes (Signed)
Pt arrived at medical day for injection with a productive cough of white sputum and some shortness of breath. Afebrile. No other symptoms. Pt states that he traveled from [place] within the past couple of weeks and had no symptoms prior to that. We immediately masked the patient and ourselves. [facility] center contacted and order given to have pt go be screened for Covid 19. Pt states understanding and discharged after his lab and injection to get his screening done.

## 2018-09-28 LAB — POCT HEMOGLOBIN-HEMACUE: Hemoglobin: 10.6 g/dL — ABNORMAL LOW (ref 13.0–17.0)

## 2018-09-30 DIAGNOSIS — N184 Chronic kidney disease, stage 4 (severe): Secondary | ICD-10-CM | POA: Diagnosis not present

## 2018-10-04 ENCOUNTER — Other Ambulatory Visit: Payer: Self-pay

## 2018-10-04 ENCOUNTER — Ambulatory Visit (HOSPITAL_COMMUNITY)
Admission: RE | Admit: 2018-10-04 | Discharge: 2018-10-04 | Disposition: A | Discharge status: Home / Self Care | Payer: PPO | Source: Ambulatory Visit | Attending: Nephrology | Admitting: Nephrology

## 2018-10-04 VITALS — BP 171/70 | HR 69 | Temp 98.0°F | Resp 20

## 2018-10-04 DIAGNOSIS — K3532 Acute appendicitis with perforation and localized peritonitis, without abscess: Secondary | ICD-10-CM | POA: Diagnosis not present

## 2018-10-04 DIAGNOSIS — N184 Chronic kidney disease, stage 4 (severe): Secondary | ICD-10-CM | POA: Diagnosis not present

## 2018-10-04 DIAGNOSIS — R600 Localized edema: Secondary | ICD-10-CM | POA: Diagnosis not present

## 2018-10-04 DIAGNOSIS — E875 Hyperkalemia: Secondary | ICD-10-CM | POA: Diagnosis not present

## 2018-10-04 DIAGNOSIS — K8020 Calculus of gallbladder without cholecystitis without obstruction: Secondary | ICD-10-CM | POA: Diagnosis not present

## 2018-10-04 DIAGNOSIS — I129 Hypertensive chronic kidney disease with stage 1 through stage 4 chronic kidney disease, or unspecified chronic kidney disease: Secondary | ICD-10-CM | POA: Diagnosis not present

## 2018-10-04 DIAGNOSIS — N179 Acute kidney failure, unspecified: Secondary | ICD-10-CM | POA: Diagnosis not present

## 2018-10-04 DIAGNOSIS — N2581 Secondary hyperparathyroidism of renal origin: Secondary | ICD-10-CM | POA: Diagnosis not present

## 2018-10-04 LAB — POCT HEMOGLOBIN-HEMACUE: HEMOGLOBIN: 10.4 g/dL — AB (ref 13.0–17.0)

## 2018-10-04 MED ORDER — EPOETIN ALFA 10000 UNIT/ML IJ SOLN
10000.0000 [IU] | INTRAMUSCULAR | Status: DC
  Administered 2018-10-04: 10000 [IU] via SUBCUTANEOUS

## 2018-10-04 MED ORDER — EPOETIN ALFA 10000 UNIT/ML IJ SOLN
INTRAMUSCULAR | Status: AC
  Filled 2018-10-04: qty 1

## 2018-10-09 LAB — NOVEL CORONAVIRUS, NAA: SARS-CoV-2, NAA: NOT DETECTED

## 2018-10-10 ENCOUNTER — Telehealth: Payer: Self-pay | Admitting: Cardiology

## 2018-10-10 NOTE — Telephone Encounter (Signed)
10/10/18 received medical records on Rodney Farley. [name]  From [facility].Patient has appointment on 12/19/18 with Dr. [name]. Gave paperwork to chart prep. fsw

## 2018-10-11 ENCOUNTER — Ambulatory Visit (HOSPITAL_COMMUNITY)
Admission: RE | Admit: 2018-10-11 | Discharge: 2018-10-11 | Disposition: A | Discharge status: Home / Self Care | Payer: PPO | Source: Ambulatory Visit | Attending: Nephrology | Admitting: Nephrology

## 2018-10-11 ENCOUNTER — Other Ambulatory Visit: Payer: Self-pay

## 2018-10-11 ENCOUNTER — Encounter: Payer: Self-pay | Admitting: Family Medicine

## 2018-10-11 VITALS — BP 175/84 | HR 80 | Temp 98.2°F

## 2018-10-11 DIAGNOSIS — N184 Chronic kidney disease, stage 4 (severe): Secondary | ICD-10-CM | POA: Insufficient documentation

## 2018-10-11 LAB — POCT HEMOGLOBIN-HEMACUE: Hemoglobin: 10.4 g/dL — ABNORMAL LOW (ref 13.0–17.0)

## 2018-10-11 MED ORDER — EPOETIN ALFA 10000 UNIT/ML IJ SOLN
INTRAMUSCULAR | Status: AC
  Administered 2018-10-11: 10000 [IU] via SUBCUTANEOUS
  Filled 2018-10-11: qty 1

## 2018-10-11 MED ORDER — EPOETIN ALFA 10000 UNIT/ML IJ SOLN
10000.0000 [IU] | INTRAMUSCULAR | Status: DC
  Administered 2018-10-11: 10000 [IU] via SUBCUTANEOUS

## 2018-10-17 ENCOUNTER — Other Ambulatory Visit: Payer: Self-pay

## 2018-10-18 ENCOUNTER — Ambulatory Visit (HOSPITAL_COMMUNITY)
Admission: RE | Admit: 2018-10-18 | Discharge: 2018-10-18 | Disposition: A | Discharge status: Home / Self Care | Payer: PPO | Source: Ambulatory Visit | Attending: Nephrology | Admitting: Nephrology

## 2018-10-18 VITALS — BP 136/106 | HR 83 | Temp 97.3°F | Resp 20

## 2018-10-18 DIAGNOSIS — N184 Chronic kidney disease, stage 4 (severe): Secondary | ICD-10-CM | POA: Diagnosis not present

## 2018-10-18 DIAGNOSIS — Z23 Encounter for immunization: Secondary | ICD-10-CM | POA: Diagnosis not present

## 2018-10-18 DIAGNOSIS — Z09 Encounter for follow-up examination after completed treatment for conditions other than malignant neoplasm: Secondary | ICD-10-CM | POA: Diagnosis not present

## 2018-10-18 DIAGNOSIS — N189 Chronic kidney disease, unspecified: Secondary | ICD-10-CM | POA: Diagnosis not present

## 2018-10-18 DIAGNOSIS — Z79899 Other long term (current) drug therapy: Secondary | ICD-10-CM | POA: Diagnosis not present

## 2018-10-18 DIAGNOSIS — M79671 Pain in right foot: Secondary | ICD-10-CM | POA: Diagnosis not present

## 2018-10-18 DIAGNOSIS — M79672 Pain in left foot: Secondary | ICD-10-CM | POA: Diagnosis not present

## 2018-10-18 LAB — IRON AND TIBC
Iron: 43 ug/dL — ABNORMAL LOW (ref 45–182)
Saturation Ratios: 12 % — ABNORMAL LOW (ref 17.9–39.5)
TIBC: 356 ug/dL (ref 250–450)
UIBC: 313 ug/dL

## 2018-10-18 LAB — POCT HEMOGLOBIN-HEMACUE: Hemoglobin: 11.2 g/dL — ABNORMAL LOW (ref 13.0–17.0)

## 2018-10-18 LAB — FERRITIN: Ferritin: 18 ng/mL — ABNORMAL LOW (ref 24–336)

## 2018-10-18 MED ORDER — EPOETIN ALFA 10000 UNIT/ML IJ SOLN
INTRAMUSCULAR | Status: AC
  Administered 2018-10-18: 10000 [IU] via SUBCUTANEOUS
  Filled 2018-10-18: qty 1

## 2018-10-18 MED ORDER — EPOETIN ALFA 10000 UNIT/ML IJ SOLN
10000.0000 [IU] | INTRAMUSCULAR | Status: DC
  Administered 2018-10-18: 13:00:00 10000 [IU] via SUBCUTANEOUS

## 2018-10-24 ENCOUNTER — Other Ambulatory Visit: Payer: Self-pay

## 2018-10-25 ENCOUNTER — Encounter (HOSPITAL_COMMUNITY)
Admission: RE | Admit: 2018-10-25 | Discharge: 2018-10-25 | Disposition: A | Discharge status: Home / Self Care | Payer: PPO | Source: Ambulatory Visit | Attending: Nephrology | Admitting: Nephrology

## 2018-10-25 VITALS — BP 168/79 | HR 75 | Temp 98.2°F

## 2018-10-25 DIAGNOSIS — N184 Chronic kidney disease, stage 4 (severe): Secondary | ICD-10-CM

## 2018-10-25 LAB — POCT HEMOGLOBIN-HEMACUE: Hemoglobin: 10.5 g/dL — ABNORMAL LOW (ref 13.0–17.0)

## 2018-10-25 MED ORDER — EPOETIN ALFA 10000 UNIT/ML IJ SOLN
10000.0000 [IU] | INTRAMUSCULAR | Status: DC
  Administered 2018-10-25: 13:00:00 10000 [IU] via SUBCUTANEOUS

## 2018-10-25 MED ORDER — EPOETIN ALFA 10000 UNIT/ML IJ SOLN
INTRAMUSCULAR | Status: AC
  Administered 2018-10-25: 10000 [IU] via SUBCUTANEOUS
  Filled 2018-10-25: qty 1

## 2018-11-01 ENCOUNTER — Ambulatory Visit (HOSPITAL_COMMUNITY)
Admission: RE | Admit: 2018-11-01 | Discharge: 2018-11-01 | Disposition: A | Discharge status: Home / Self Care | Payer: PPO | Source: Ambulatory Visit | Attending: Nephrology | Admitting: Nephrology

## 2018-11-01 VITALS — BP 152/68 | HR 92 | Temp 97.5°F | Resp 20

## 2018-11-01 DIAGNOSIS — N184 Chronic kidney disease, stage 4 (severe): Secondary | ICD-10-CM | POA: Insufficient documentation

## 2018-11-01 LAB — POCT HEMOGLOBIN-HEMACUE: Hemoglobin: 10.7 g/dL — ABNORMAL LOW (ref 13.0–17.0)

## 2018-11-01 MED ORDER — EPOETIN ALFA 10000 UNIT/ML IJ SOLN
INTRAMUSCULAR | Status: AC
  Administered 2018-11-01: 10000 [IU] via SUBCUTANEOUS
  Filled 2018-11-01: qty 1

## 2018-11-01 MED ORDER — EPOETIN ALFA 10000 UNIT/ML IJ SOLN
10000.0000 [IU] | INTRAMUSCULAR | Status: DC
  Administered 2018-11-01: 10000 [IU] via SUBCUTANEOUS

## 2018-11-04 ENCOUNTER — Ambulatory Visit (INDEPENDENT_AMBULATORY_CARE_PROVIDER_SITE_OTHER): Payer: PPO | Admitting: Family Medicine

## 2018-11-04 ENCOUNTER — Encounter: Payer: Self-pay | Admitting: Family Medicine

## 2018-11-04 DIAGNOSIS — Z794 Long term (current) use of insulin: Secondary | ICD-10-CM

## 2018-11-04 DIAGNOSIS — E1122 Type 2 diabetes mellitus with diabetic chronic kidney disease: Secondary | ICD-10-CM | POA: Diagnosis not present

## 2018-11-04 DIAGNOSIS — I5022 Chronic systolic (congestive) heart failure: Secondary | ICD-10-CM

## 2018-11-04 DIAGNOSIS — I10 Essential (primary) hypertension: Secondary | ICD-10-CM | POA: Diagnosis not present

## 2018-11-04 DIAGNOSIS — N184 Chronic kidney disease, stage 4 (severe): Secondary | ICD-10-CM

## 2018-11-04 DIAGNOSIS — F331 Major depressive disorder, recurrent, moderate: Secondary | ICD-10-CM | POA: Diagnosis not present

## 2018-11-04 NOTE — Assessment & Plan Note (Signed)
S:compliant lasix 40mg  BID - increased by nephrology. Swelling is pretty stable- seems to vary some.  Some recent weight gain but has not been eating the best and quarantine.  Denies worsening shortness of breath  When saw Dr. Justin Mend- kidneys were stable. On year program to prepare for dialysis- potentially have graft placed A/P: CKD stage IV appears stable-close follow-up with Dr. Justin Mend with nephrology.  Systolic heart failure also seems stable- increased dose of Lasix seems to be coming from nephrology

## 2018-11-04 NOTE — Assessment & Plan Note (Signed)
S: Mild poorly controlled on coreg 6.25mg  BID, lasix 40mg  BID now, imdur 60mg  BP Readings from Last 3 Encounters:  11/04/18 140/87  11/01/18 (!) 152/68  10/25/18 (!) 168/79  A/P: Slightly poorly controlled today- with recent adjustment in Lasix by nephrology I think we should monitor alone for now-can reevaluate in 3 months and also hopefully get an in-person measurement

## 2018-11-04 NOTE — Assessment & Plan Note (Signed)
S: well controlled on 15 units of lantus.  CBGs- 110-125 on sugars. No low blood sugars Exercise and diet- reasonably balanced diet- balancing with kidneys and diabetes as well as difficulties in finding some foods Lab Results  Component Value Date   HGBA1C 5.3 08/03/2018   HGBA1C 5.3 04/13/2018   HGBA1C 5.6 10/07/2017   A/P: Stable. Continue current medications.  We opted in light of COVID-19 to delay next A1c to 3 months given excellent sugar control at home and no hypoglycemia

## 2018-11-04 NOTE — Assessment & Plan Note (Signed)
Stable-now on Lasix 40 mg twice daily.  Increase by nephrology I believe

## 2018-11-04 NOTE — Progress Notes (Signed)
Phone [phone]   Subjective:  Virtual visit via Video note. Chief complaint: Chief Complaint  Patient presents with  . Follow-up    66monthand 1 day follow-up-diabetes and other chronic issues   This visit type was conducted due to national recommendations for restrictions regarding the COVID-19 Pandemic (e.g. social distancing).  This format is felt to be most appropriate for this patient at this time balancing risks to patient and risks to population by having him in for in person visit.  No physical exam was performed (except for noted visual exam or audio findings with Telehealth visits).    Our team/I connected with PRandall Hisson 11/04/18 at  9:40 AM EDT by a video enabled telemedicine application ([email or web address]) and verified that I am speaking with the correct person using two identifiers.  Location patient: [facility] Persons participating in the virtual visit:  patient and son PEddie Dibbleswho helps set up the visit  Our team/I discussed the limitations of evaluation and management by telemedicine and the availability of in person appointments. In light of current covid-19 pandemic, patient also understands that we are trying to protect them by minimizing in office contact if at all possible.  The patient expressed consent for telemedicine visit and agreed to proceed. Patient understands insurance will be billed.   ROS-stable shortness of breath.  Stable edema.  Slight weight gain due to poor eating habits due to COVID-19.  No chest pain reported  Past Medical History-  Patient Active Problem List   Diagnosis Date Noted  . Ruptured appendicitis 03/26/2017    Priority: High  . Marital stress 02/18/2017    Priority: High  . COPD (chronic obstructive pulmonary disease) ([place]     Priority: High  . Chronic systolic CHF (congestive heart failure) ([place] 04/08/2015    Priority: High  . DOE (dyspnea on exertion), due to significant CAD 03/28/2015     Priority: High  . Chronic kidney disease (CKD), stage IV (severe) ([place] 03/04/2015    Priority: High  . Coronary artery disease involving native coronary artery of native heart with angina pectoris ([place] 09/13/2014    Priority: High  . Depression, major, recurrent, moderate ([place] 08/05/2012    Priority: High  . Controlled type 2 diabetes mellitus with stage 4 chronic kidney disease ([place] 08/05/2012    Priority: High  . Ischemic cardiomyopathy 11/03/2011    Priority: High  . Atrioventricular block, complete (HCC)     Priority: High  . Pacemaker     Priority: High  . Hyperlipidemia 04/13/2018    Priority: Medium  . BPH associated with nocturia 04/27/2016    Priority: Medium  . Diabetic peripheral neuropathy (HCC)     Priority: Medium  . Bell's palsy     Priority: Medium  . Gout 06/26/2015    Priority: Medium  . Sleep apnea 08/05/2012    Priority: Medium  . HTN (hypertension) 08/05/2012    Priority: Medium  . S/P cholecystectomy 10/13/2017    Priority: Low  . Trigger finger 06/10/2016    Priority: Low  . Diverticulitis 04/08/2015    Priority: Low  . GERD (gastroesophageal reflux disease) 08/07/2012    Priority: Low  . Obesity     Priority: Low  . Diarrhea 05/11/2017    Medications- reviewed and updated Current Outpatient Medications  Medication Sig Dispense Refill  . albuterol (PROVENTIL HFA;VENTOLIN HFA) 108 (90 Base) MCG/ACT inhaler Inhale 2 puffs into the lungs every 6 (six) hours as needed for  wheezing or shortness of breath. 1 Inhaler 2  . aspirin EC 81 MG tablet Take 81 mg by mouth daily.    [name] atorvastatin (LIPITOR) 40 MG tablet Take 1 tablet (40 mg total) by mouth daily. 90 tablet 3  . calcitRIOL (ROCALTROL) 0.25 MCG capsule Take 1 capsule by mouth daily.    . carvedilol (COREG) 12.5 MG tablet Take 12.5 mg by mouth 2 (two) times daily.    [name] Epoetin Alfa (PROCRIT IJ) Inject as directed. Through Dr. Justin Mend    . gabapentin (NEURONTIN) 100 MG capsule TAKE 2  CAPSULES BY MOUTH AT BEDTIME 180 capsule 1  . glucose blood (FREESTYLE TEST STRIPS) test strip Use to check blood sugar daily 100 each 3  . glucose monitoring kit (FREESTYLE) monitoring kit USE TO MONITOR BLOOD GLUCOSE AS DIRECTED 1 each 1  . Insulin Glargine (LANTUS SOLOSTAR) 100 UNIT/ML Solostar Pen Inject 15 Units into the skin daily. 15 mL 2  . Insulin Pen Needle (UNIFINE PENTIPS) 31G X 5 MM MISC USE AS DIRECTED 100 each 0  . isosorbide mononitrate (IMDUR) 60 MG 24 hr tablet TAKE 1 TABLET BY MOUTH ONCE DAILY 90 tablet 2  . lansoprazole (PREVACID) 15 MG capsule Take 1 capsule (15 mg total) by mouth daily at 12 noon. 90 capsule 3  . nitroGLYCERIN (NITROSTAT) 0.4 MG SL tablet Place 1 tablet (0.4 mg total) under the tongue every 5 (five) minutes as needed for chest pain. 2 doses before calling for emergent help 25 tablet 3  . polyethylene glycol (MIRALAX / GLYCOLAX) packet Take 17 g by mouth daily. 14 each 0  . Polyethylene Glycol 400 (BLINK TEARS) 0.25 % SOLN Place 1 drop into both eyes 3 (three) times daily.    . sertraline (ZOLOFT) 100 MG tablet Take 1.5 tablets (150 mg total) by mouth daily. 135 tablet 1  . sodium bicarbonate 650 MG tablet Take 650 mg by mouth 2 (two) times daily.    . tamsulosin (FLOMAX) 0.4 MG CAPS capsule TAKE 1 CAPSULE BY MOUTH ONCE DAILY AFTER SUPPER 90 capsule 3  . triamcinolone cream (KENALOG) 0.1 % APPLY CREAM EXTERNALLY TWICE DAILY FOR 7 TO 10 DAYS MAXMIUM 80 g 0  . vitamin B-12 (CYANOCOBALAMIN) 1000 MCG tablet Take 1,000 mcg by mouth daily.    . furosemide (LASIX) 40 MG tablet Take 1 tablet (40 mg total) by mouth 2 (two) times daily. 60 tablet 6   No current facility-administered medications for this visit.      Objective:  BP 140/87   Pulse 67   Temp 98.1 F (36.7 C) (Oral)   Ht '5\' 7"'  (1.702 m)   Wt 214 lb 9.6 oz (97.3 kg)   BMI 33.61 kg/m  Gen: NAD, resting comfortably Lungs: nonlabored, normal respiratory rate  Skin: appears dry, no obvious rash Hard  of hearing, normal speech    Assessment and Plan   # COPD S:healed up from prior copd exacerbation with prednisone and doxycyclie. That's the time he stopped going out.  Patient is not currently needing controller inhalers-has albuterol available as needed A/P:  Stable. Continue current medications.     # CKD IV and systolic HF S:compliant lasix 34m BID - increased by nephrology. Swelling is pretty stable- seems to vary some.  Some recent weight gain but has not been eating the best and quarantine.  Denies worsening shortness of breath  When saw Dr. WJustin Mend kidneys were stable. On year program to prepare for dialysis- potentially have graft placed A/P: CKD  stage IV appears stable-close follow-up with Dr. Justin Mend with nephrology.  Systolic heart failure also seems stable- increased dose of Lasix seems to be coming from nephrology  #hypertension S: Mild poorly controlled on coreg 6.20m BID, lasix 459mBID now, imdur 6040mP Readings from Last 3 Encounters:  11/04/18 140/87  11/01/18 (!) 152/68  10/25/18 (!) 168/79  A/P: Slightly poorly controlled today- with recent adjustment in Lasix by nephrology I think we should monitor alone for now-can reevaluate in 3 months and also hopefully get an in-person measurement  # Diabetes S: well controlled on 15 units of lantus.  CBGs- 110-125 on sugars. No low blood sugars Exercise and diet- reasonably balanced diet- balancing with kidneys and diabetes as well as difficulties in finding some foods Lab Results  Component Value Date   HGBA1C 5.3 08/03/2018   HGBA1C 5.3 04/13/2018   HGBA1C 5.6 10/07/2017   A/P: Stable. Continue current medications.  We opted in light of COVID-19 to delay next A1c to 3 months given excellent sugar control at home and no hypoglycemia  # Depression S: improved control with zoloft to 150m63moing video conferencing today has opened him up to meeting with friends and family by video. Last time granddaughter came- chatted  through screen door- those have been hard on him but overall he feels he is doing well all things considered Depression screen [facility] 11/04/2018  Decreased Interest 2  Down, Depressed, Hopeless 0  PHQ - 2 Score 2  Altered sleeping 3  Tired, decreased energy 3  Change in appetite 0  Feeling bad or failure about yourself  0  Trouble concentrating 3  Moving slowly or fidgety/restless 0  Suicidal thoughts 0  PHQ-9 Score 11  Difficult doing work/chores Not difficult at all  Some recent data might be hidden  A/P: Mild poor control-some of this is situational with COVID-19-honestly I think patient is doing reasonably well.  They are hoping video conferencing with family more after doing online visit today will help him- patient states being able to do the video visit today encouraged him in his ability to contact other people through online methods  Other notes: 1.church cancelled- has been hard on him. hes going to try video. Family is visiting more.  He is staying at home.  2. RUQ pain still 5/10 with yawning- not worsening- will continue to monitor- may be scar tissue related from surgery. He doesn't mention ti as much.   Advised patient to call for 3-mo27-monthow-up-hopefully in person.  Reports recent labs with nephrology so we thought we could delay doing labs at this time Future Appointments  Date Time Provider DeparGlasgow8/2020  1:00 PM [facility] INJECTION ROOM [facility] None  12/19/2018  8:05 AM [facility] DEVICE REMOTES [facility]  12/19/2018 10:20 AM JordaMartiniqueer M, MD [facility] associations: Chronic systolic CHF (congestive heart failure) (HCC)  Chronic kidney disease (CKD), stage IV (severe) (HCC) [place] hypertension  Controlled type 2 diabetes mellitus with stage 4 chronic kidney disease, with long-term current use of insulin (HCC)  Depression, major, recurrent, moderate (HCC) [place] precautions advised.  StephGarret Reddish

## 2018-11-04 NOTE — Assessment & Plan Note (Signed)
S: improved control with zoloft to 150mg . Doing video conferencing today has opened him up to meeting with friends and family by video. Last time granddaughter came- chatted through screen door- those have been hard on him but overall he feels he is doing well all things considered Depression screen [facility] 2/9 11/04/2018  Decreased Interest 2  Down, Depressed, Hopeless 0  PHQ - 2 Score 2  Altered sleeping 3  Tired, decreased energy 3  Change in appetite 0  Feeling bad or failure about yourself  0  Trouble concentrating 3  Moving slowly or fidgety/restless 0  Suicidal thoughts 0  PHQ-9 Score 11  Difficult doing work/chores Not difficult at all  Some recent data might be hidden  A/P: Mild poor control-some of this is situational with COVID-19-honestly I think patient is doing reasonably well.  They are hoping video conferencing with family more after doing online visit today will help him- patient states being able to do the video visit today encouraged him in his ability to contact other people through online methods

## 2018-11-04 NOTE — Patient Instructions (Addendum)
Health Maintenance Due  Topic Date Due  . FOOT EXAM Future- in person office visit 08/12/2018    Depression screen [facility] 2/9 11/04/2018 08/03/2018 04/13/2018  Decreased Interest 2 2 3   Down, Depressed, Hopeless 0 1 2  PHQ - 2 Score 2 3 5   Altered sleeping 3 3 3   Tired, decreased energy 3 2 3   Change in appetite 0 1 2  Feeling bad or failure about yourself  0 3 2  Trouble concentrating 3 3 2   Moving slowly or fidgety/restless 0 2 0  Suicidal thoughts 0 0 0  PHQ-9 Score 11 17 17   Difficult doing work/chores Not difficult at all Somewhat difficult Somewhat difficult  Some recent data might be hidden

## 2018-11-08 ENCOUNTER — Ambulatory Visit (HOSPITAL_COMMUNITY)
Admission: RE | Admit: 2018-11-08 | Discharge: 2018-11-08 | Disposition: A | Discharge status: Home / Self Care | Payer: PPO | Source: Ambulatory Visit | Attending: Nephrology | Admitting: Nephrology

## 2018-11-08 ENCOUNTER — Other Ambulatory Visit: Payer: Self-pay

## 2018-11-08 VITALS — BP 176/81 | HR 82 | Temp 98.6°F | Resp 20

## 2018-11-08 DIAGNOSIS — N184 Chronic kidney disease, stage 4 (severe): Secondary | ICD-10-CM | POA: Insufficient documentation

## 2018-11-08 MED ORDER — EPOETIN ALFA 10000 UNIT/ML IJ SOLN: 10000.0000 [IU] | INTRAMUSCULAR | Status: DC

## 2018-11-08 MED ORDER — EPOETIN ALFA 10000 UNIT/ML IJ SOLN
INTRAMUSCULAR | Status: AC
  Administered 2018-11-08: 10000 [IU] via SUBCUTANEOUS
  Filled 2018-11-08: qty 1

## 2018-11-09 LAB — POCT HEMOGLOBIN-HEMACUE: Hemoglobin: 10.6 g/dL — ABNORMAL LOW (ref 13.0–17.0)

## 2018-11-12 ENCOUNTER — Other Ambulatory Visit: Payer: Self-pay | Admitting: Family Medicine

## 2018-11-12 DIAGNOSIS — F329 Major depressive disorder, single episode, unspecified: Secondary | ICD-10-CM

## 2018-11-12 DIAGNOSIS — F32A Depression, unspecified: Secondary | ICD-10-CM

## 2018-11-15 ENCOUNTER — Encounter (HOSPITAL_COMMUNITY): Payer: PPO

## 2018-11-22 ENCOUNTER — Encounter (HOSPITAL_COMMUNITY): Payer: PPO

## 2018-11-23 DIAGNOSIS — N189 Chronic kidney disease, unspecified: Secondary | ICD-10-CM | POA: Diagnosis not present

## 2018-11-23 DIAGNOSIS — R600 Localized edema: Secondary | ICD-10-CM | POA: Diagnosis not present

## 2018-11-23 DIAGNOSIS — D631 Anemia in chronic kidney disease: Secondary | ICD-10-CM | POA: Diagnosis not present

## 2018-11-23 DIAGNOSIS — E875 Hyperkalemia: Secondary | ICD-10-CM | POA: Diagnosis not present

## 2018-11-23 DIAGNOSIS — N184 Chronic kidney disease, stage 4 (severe): Secondary | ICD-10-CM | POA: Diagnosis not present

## 2018-11-23 DIAGNOSIS — K3532 Acute appendicitis with perforation and localized peritonitis, without abscess: Secondary | ICD-10-CM | POA: Diagnosis not present

## 2018-11-23 DIAGNOSIS — N179 Acute kidney failure, unspecified: Secondary | ICD-10-CM | POA: Diagnosis not present

## 2018-11-23 DIAGNOSIS — I129 Hypertensive chronic kidney disease with stage 1 through stage 4 chronic kidney disease, or unspecified chronic kidney disease: Secondary | ICD-10-CM | POA: Diagnosis not present

## 2018-11-23 DIAGNOSIS — K8020 Calculus of gallbladder without cholecystitis without obstruction: Secondary | ICD-10-CM | POA: Diagnosis not present

## 2018-11-23 DIAGNOSIS — N2581 Secondary hyperparathyroidism of renal origin: Secondary | ICD-10-CM | POA: Diagnosis not present

## 2018-11-23 MED FILL — SPS 15 GM/60 ML SUSPENSION: 15 | 1 days supply | Qty: 113 | Fill #0

## 2018-11-24 DIAGNOSIS — E875 Hyperkalemia: Secondary | ICD-10-CM | POA: Diagnosis not present

## 2018-11-29 ENCOUNTER — Other Ambulatory Visit: Payer: Self-pay

## 2018-11-29 DIAGNOSIS — N184 Chronic kidney disease, stage 4 (severe): Secondary | ICD-10-CM

## 2018-12-02 ENCOUNTER — Ambulatory Visit (HOSPITAL_COMMUNITY)
Admission: RE | Admit: 2018-12-02 | Discharge: 2018-12-02 | Disposition: A | Discharge status: Home / Self Care | Payer: PPO | Source: Ambulatory Visit | Attending: Vascular Surgery | Admitting: Vascular Surgery

## 2018-12-02 ENCOUNTER — Encounter: Payer: PPO | Admitting: Vascular Surgery

## 2018-12-02 ENCOUNTER — Encounter: Payer: Self-pay | Admitting: Vascular Surgery

## 2018-12-02 ENCOUNTER — Other Ambulatory Visit: Payer: Self-pay

## 2018-12-02 ENCOUNTER — Ambulatory Visit (INDEPENDENT_AMBULATORY_CARE_PROVIDER_SITE_OTHER): Payer: PPO | Admitting: Vascular Surgery

## 2018-12-02 VITALS — BP 164/84 | HR 77 | Temp 98.9°F | Resp 20 | Ht 67.0 in | Wt 213.0 lb

## 2018-12-02 DIAGNOSIS — N184 Chronic kidney disease, stage 4 (severe): Secondary | ICD-10-CM | POA: Diagnosis not present

## 2018-12-02 NOTE — Progress Notes (Signed)
Patient ID: Rodney Farley, male   DOB: [birth year], 85 y.o.   MRN: [id number]  Reason for Consult: New Patient (Initial Visit)   Referred by [name], MD  Subjective:     HPI:  Rodney Farley is a 85 y.o. male right-hand-dominant man with history of AICD placement on the left.  Has never been on dialysis before.  Does not have any upper extremity surgery in the past.  Did have a trigger finger on the left that was released that was all.  Does not take any blood thinners.  He is alone today his son is in the waiting room and is accompanying [place] via speaker phone.  Past Medical History:  Diagnosis Date  . AICD (automatic cardioverter/defibrillator) present   . AKI (acute kidney injury) ([place]) 03/27/2017  . Anemia   . Anginal pain ([place])   . Anxiety   . Arm pain 05/08/2015   LEFT ARM  . Atrioventricular block, complete ([place])    a. 2010 s/p pacemaker.  . Bell's palsy    left side. after shingles episode  . Bilateral renal cysts 07/23/2017   Simple and hemorrhagic noted on CT ab/pelvis   . BPH associated with nocturia   . Cardiomegaly   . Chronic systolic CHF (congestive heart failure) ([place])    a. LVEF 35-40% by echo 09/2014.  [name] CKD (chronic kidney disease), stage IV ([place])   . COPD (chronic obstructive pulmonary disease) (HCC)    Severe  . Coronary artery disease    a. s/p MI in 1994/1995 while in [place] s/p questionable PCI. 03/2015: progression of disease, for staged PCI.  [name] Depression   . Diabetic peripheral neuropathy ([place])   . Diarrhea   . Diverticulitis   . DOE (dyspnea on exertion) 03/28/2015  . Gallstones   . GERD (gastroesophageal reflux disease)   . Gout   . Hard of hearing   . History of chronic pancreatitis 07/23/2017   noted on CT abd/pelvis  . History of shingles   . Hypercholesterolemia   . Hypertension   . Ischemic cardiomyopathy   . MI (myocardial infarction) ([place]) 1994; 1995  . Neuropathy    IN LOWER EXTREMITIES  . Nosebleed  10/06/2017   for 2 months most recent 10/06/2017  . Obesity   . Pacemaker    medtronic>>> MDT ICD 09/23/15  . Ruptured appendicitis   . Sleep apnea    "sleeps w/humidifyer when he panics and gets short of breath" (04/08/2015)  . TIA (transient ischemic attack) X 3  . Trigger middle finger of left hand   . Type II diabetes mellitus (HCC)    Family History  Problem Relation Age of Onset  . Stroke Mother   . Leukemia Father   . Stroke Sister   . Heart attack Brother    Past Surgical History:  Procedure Laterality Date  . CARDIAC CATHETERIZATION N/A 03/29/2015   Procedure: Right/Left Heart Cath and Coronary Angiography;  Surgeon: Kesean M [name], MD;  Location: [place] [facility];  Service: Cardiovascular;  Laterality: N/A;  . [place]   "after my MI; put me on heart RX after cath"  . CARDIAC CATHETERIZATION N/A 04/09/2015   Procedure: Coronary Stent Intervention;  Surgeon: Raegan M [name], MD;  Location: [place] [facility];  Service: Cardiovascular;  Laterality: N/A;  . CHOLECYSTECTOMY N/A 10/13/2017   Procedure: LAPAROSCOPIC CHOLECYSTECTOMY WITH LYSIS OF ADHESIONS;  Surgeon: Ileana Roup, MD;  Location: [facility];  Service: General;  Laterality: N/A;  . COLONOSCOPY    . DENTAL SURGERY    . EP IMPLANTABLE DEVICE N/A 09/23/2015   MDT CRT-D, Dr. Caryl Comes  . HIATAL HERNIA REPAIR  1977  . ILEOCECETOMY N/A 03/27/2017   Procedure: ILEOCECECTOMY;  Surgeon: Ileana Roup, MD;  Location: [place];  Service: General;  Laterality: N/A;  . INSERT / REPLACE / REMOVE PACEMAKER  07/2008   Complete heart block status post DDD with good function  . LAPAROTOMY N/A 03/27/2017   Procedure: EXPLORATORY LAPAROTOMY;  Surgeon: Ileana Roup, MD;  Location: [place];  Service: General;  Laterality: N/A;  . TONSILLECTOMY    . UPPER GI ENDOSCOPY      Short Social History:  Social History   Tobacco Use  . Smoking status: Former Smoker    Packs/day: 1.50    Years: 54.00    Pack  years: 81.00    Types: Cigarettes    Last attempt to quit: 07/18/2007    Years since quitting: 11.3  . Smokeless tobacco: Never Used  . Tobacco comment: did not discuss LdCT but being evaluated for chole currently  Substance Use Topics  . Alcohol use: Yes    Comment: 04/08/2015 "probably 3 drinks/month"    Allergies  Allergen Reactions  . Bee Venom Anaphylaxis  . Lyrica [Pregabalin] Other (See Comments)    hallucinations  . Prednisone Other (See Comments)    hallucinations  . Zocor [Simvastatin] Nausea Only and Other (See Comments)    Headache with brand name only.  Can take the generic.    Current Outpatient Medications  Medication Sig Dispense Refill  . albuterol (PROVENTIL HFA;VENTOLIN HFA) 108 (90 Base) MCG/ACT inhaler Inhale 2 puffs into the lungs every 6 (six) hours as needed for wheezing or shortness of breath. 1 Inhaler 2  . aspirin EC 81 MG tablet Take 81 mg by mouth daily.    [name] atorvastatin (LIPITOR) 40 MG tablet Take 1 tablet (40 mg total) by mouth daily. 90 tablet 3  . calcitRIOL (ROCALTROL) 0.25 MCG capsule Take 1 capsule by mouth daily.    . carvedilol (COREG) 12.5 MG tablet Take 12.5 mg by mouth 2 (two) times daily.    [name] Epoetin Alfa (PROCRIT IJ) Inject as directed. Through Dr. Justin Mend    . gabapentin (NEURONTIN) 100 MG capsule TAKE 2 CAPSULES BY MOUTH AT BEDTIME 180 capsule 1  . glucose blood (FREESTYLE TEST STRIPS) test strip Use to check blood sugar daily 100 each 3  . glucose monitoring kit (FREESTYLE) monitoring kit USE TO MONITOR BLOOD GLUCOSE AS DIRECTED 1 each 1  . Insulin Glargine (LANTUS SOLOSTAR) 100 UNIT/ML Solostar Pen Inject 15 Units into the skin daily. 15 mL 2  . Insulin Pen Needle (UNIFINE PENTIPS) 31G X 5 MM MISC USE AS DIRECTED 100 each 0  . isosorbide mononitrate (IMDUR) 60 MG 24 hr tablet TAKE 1 TABLET BY MOUTH ONCE DAILY 90 tablet 2  . lansoprazole (PREVACID) 15 MG capsule Take 1 capsule (15 mg total) by mouth daily at 12 noon. 90 capsule 3  .  nitroGLYCERIN (NITROSTAT) 0.4 MG SL tablet Place 1 tablet (0.4 mg total) under the tongue every 5 (five) minutes as needed for chest pain. 2 doses before calling for emergent help 25 tablet 3  . polyethylene glycol (MIRALAX / GLYCOLAX) packet Take 17 g by mouth daily. 14 each 0  . Polyethylene Glycol 400 (BLINK TEARS) 0.25 % SOLN Place 1 drop into both eyes 3 (three) times daily.    [name]  sertraline (ZOLOFT) 100 MG tablet TAKE 1 & 1/2 (ONE & ONE-HALF) TABLETS BY MOUTH ONCE DAILY 135 tablet 0  . sodium bicarbonate 650 MG tablet Take 650 mg by mouth 2 (two) times daily.    . SPS 15 GM/60ML suspension     . tamsulosin (FLOMAX) 0.4 MG CAPS capsule TAKE 1 CAPSULE BY MOUTH ONCE DAILY AFTER SUPPER 90 capsule 3  . triamcinolone cream (KENALOG) 0.1 % APPLY CREAM EXTERNALLY TWICE DAILY FOR 7 TO 10 DAYS MAXMIUM 80 g 0  . vitamin B-12 (CYANOCOBALAMIN) 1000 MCG tablet Take 1,000 mcg by mouth daily.    . furosemide (LASIX) 40 MG tablet Take 1 tablet (40 mg total) by mouth 2 (two) times daily. 60 tablet 6   No current facility-administered medications for this visit.     Review of Systems  Constitutional:  Constitutional negative. HENT: HENT negative.  Eyes: Eyes negative.  Respiratory: Respiratory negative.  Cardiovascular: Cardiovascular negative.  GI: Gastrointestinal negative.  Musculoskeletal: Musculoskeletal negative.  Skin: Skin negative.  Neurological: Neurological negative. Hematologic: Hematologic/lymphatic negative.  Psychiatric: Psychiatric negative.        Objective:   Vitals:   12/02/18 1334  BP: (!) 164/84  Pulse: 77  Resp: 20  Temp: 98.9 F (37.2 C)  SpO2: 95%     Physical Exam HENT:     Head: Normocephalic.     Nose: Nose normal.     Mouth/Throat:     Mouth: Mucous membranes are moist.  Eyes:     Pupils: Pupils are equal, round, and reactive to light.  Neck:     Musculoskeletal: Normal range of motion and neck supple.  Cardiovascular:     Rate and Rhythm: Normal  rate.     Pulses:          Radial pulses are 2+ on the right side and 2+ on the left side.  Abdominal:     General: Abdomen is flat.     Palpations: Abdomen is soft.  Musculoskeletal: Normal range of motion.        General: No swelling.  Skin:    General: Skin is warm and dry.     Capillary Refill: Capillary refill takes less than 2 seconds.  Neurological:     General: No focal deficit present.     Mental Status: He is alert.  Psychiatric:        Mood and Affect: Mood normal.        Behavior: Behavior normal.        Thought Content: Thought content normal.        Judgment: Judgment normal.     Data: I have independently interpreted his vein mapping that demonstrate suitable cephalic and basilic veins in his bilateral upper extremities.  Appears that he could have a cephalic fistula at the wrist on the left.  I have also independently interpreted his bilateral upper extremity arterial duplexes which demonstrate triphasic waveforms throughout.  Brachial artery on the right 0.57 cm left 0.61 cm.     Assessment/Plan:     85 year old male who is right-hand dominant but has left-sided AICD.  Appears to have suitable right-sided vein at least at the antecubitum.  I discussed options for dialysis access including catheter, graft, fistula with fistula being preferable.  At this time patient is not sure that he wants to proceed with dialysis at all.  He will discuss with his family as well as with Dr. Justin Mend and we can schedule after that if he wants to proceed.  Certainly if he has further questions for me this could be done via video visit.     Waynetta Sandy MD [facility]

## 2018-12-08 ENCOUNTER — Other Ambulatory Visit: Payer: Self-pay | Admitting: Physician Assistant

## 2018-12-12 ENCOUNTER — Telehealth: Payer: Self-pay | Admitting: Cardiology

## 2018-12-12 NOTE — Telephone Encounter (Signed)
LVM for pt to call and reschedule or be put on wait list as appointment time templates have changed.

## 2018-12-19 ENCOUNTER — Ambulatory Visit (INDEPENDENT_AMBULATORY_CARE_PROVIDER_SITE_OTHER): Payer: PPO | Admitting: *Deleted

## 2018-12-19 ENCOUNTER — Ambulatory Visit: Payer: PPO | Admitting: Cardiology

## 2018-12-19 DIAGNOSIS — I442 Atrioventricular block, complete: Secondary | ICD-10-CM | POA: Diagnosis not present

## 2018-12-19 DIAGNOSIS — I255 Ischemic cardiomyopathy: Secondary | ICD-10-CM

## 2018-12-19 LAB — CUP PACEART REMOTE DEVICE CHECK
Battery Remaining Longevity: 18 mo
Battery Voltage: 2.92 V
Brady Statistic AP VP Percent: 92.37 %
Brady Statistic AP VS Percent: 2.22 %
Brady Statistic AS VP Percent: 5.09 %
Brady Statistic AS VS Percent: 0.32 %
Brady Statistic RA Percent Paced: 93.45 %
Brady Statistic RV Percent Paced: 95.88 %
Date Time Interrogation Session: 20200608073623
HighPow Impedance: 62 Ohm
Implantable Lead Implant Date: 20100112
Implantable Lead Implant Date: 20170313
Implantable Lead Implant Date: 20170313
Implantable Lead Location: 753858
Implantable Lead Location: 753859
Implantable Lead Location: 753860
Implantable Lead Model: 4598
Implantable Lead Model: 5076
Implantable Pulse Generator Implant Date: 20170313
Lead Channel Impedance Value: 342 Ohm
Lead Channel Impedance Value: 342 Ohm
Lead Channel Impedance Value: 361 Ohm
Lead Channel Impedance Value: 361 Ohm
Lead Channel Impedance Value: 399 Ohm
Lead Channel Impedance Value: 399 Ohm
Lead Channel Impedance Value: 418 Ohm
Lead Channel Impedance Value: 456 Ohm
Lead Channel Impedance Value: 608 Ohm
Lead Channel Impedance Value: 608 Ohm
Lead Channel Impedance Value: 665 Ohm
Lead Channel Impedance Value: 665 Ohm
Lead Channel Impedance Value: 665 Ohm
Lead Channel Pacing Threshold Amplitude: 0.25 V
Lead Channel Pacing Threshold Amplitude: 1.375 V
Lead Channel Pacing Threshold Amplitude: 1.375 V
Lead Channel Pacing Threshold Pulse Width: 0.4 ms
Lead Channel Pacing Threshold Pulse Width: 0.4 ms
Lead Channel Pacing Threshold Pulse Width: 0.4 ms
Lead Channel Sensing Intrinsic Amplitude: 0.625 mV
Lead Channel Sensing Intrinsic Amplitude: 0.625 mV
Lead Channel Sensing Intrinsic Amplitude: 16.5 mV
Lead Channel Sensing Intrinsic Amplitude: 16.5 mV
Lead Channel Setting Pacing Amplitude: 2.5 V
Lead Channel Setting Pacing Amplitude: 2.75 V
Lead Channel Setting Pacing Amplitude: 3 V
Lead Channel Setting Pacing Pulse Width: 0.4 ms
Lead Channel Setting Pacing Pulse Width: 0.4 ms
Lead Channel Setting Sensing Sensitivity: 0.3 mV

## 2018-12-28 NOTE — Progress Notes (Signed)
Remote ICD transmission.   

## 2018-12-29 ENCOUNTER — Other Ambulatory Visit: Payer: Self-pay

## 2018-12-29 ENCOUNTER — Encounter (HOSPITAL_COMMUNITY)
Admission: RE | Admit: 2018-12-29 | Discharge: 2018-12-29 | Disposition: A | Discharge status: Home / Self Care | Payer: PPO | Source: Ambulatory Visit | Attending: Nephrology | Admitting: Nephrology

## 2018-12-29 VITALS — BP 144/67 | HR 76 | Temp 97.7°F | Resp 20

## 2018-12-29 DIAGNOSIS — N184 Chronic kidney disease, stage 4 (severe): Secondary | ICD-10-CM

## 2018-12-29 LAB — IRON AND TIBC
Iron: 50 ug/dL (ref 45–182)
Saturation Ratios: 16 % — ABNORMAL LOW (ref 17.9–39.5)
TIBC: 312 ug/dL (ref 250–450)
UIBC: 262 ug/dL

## 2018-12-29 LAB — FERRITIN: Ferritin: 42 ng/mL (ref 24–336)

## 2018-12-29 LAB — POCT HEMOGLOBIN-HEMACUE: Hemoglobin: 9.2 g/dL — ABNORMAL LOW (ref 13.0–17.0)

## 2018-12-29 MED ORDER — EPOETIN ALFA 10000 UNIT/ML IJ SOLN
INTRAMUSCULAR | Status: AC
  Administered 2018-12-29: 10000 [IU]
  Filled 2018-12-29: qty 1

## 2018-12-29 MED ORDER — EPOETIN ALFA 10000 UNIT/ML IJ SOLN: 10000.0000 [IU] | INTRAMUSCULAR | Status: DC

## 2019-01-04 ENCOUNTER — Other Ambulatory Visit: Payer: Self-pay | Admitting: Family Medicine

## 2019-01-04 ENCOUNTER — Other Ambulatory Visit (HOSPITAL_COMMUNITY): Payer: Self-pay | Admitting: *Deleted

## 2019-01-05 ENCOUNTER — Other Ambulatory Visit: Payer: Self-pay

## 2019-01-05 ENCOUNTER — Encounter (HOSPITAL_COMMUNITY): Payer: PPO

## 2019-01-05 ENCOUNTER — Ambulatory Visit (HOSPITAL_COMMUNITY)
Admission: RE | Admit: 2019-01-05 | Discharge: 2019-01-05 | Disposition: A | Discharge status: Home / Self Care | Payer: PPO | Source: Ambulatory Visit | Attending: Nephrology | Admitting: Nephrology

## 2019-01-05 VITALS — BP 143/72 | HR 70 | Temp 97.7°F | Resp 20 | Ht 67.0 in | Wt 209.0 lb

## 2019-01-05 DIAGNOSIS — N179 Acute kidney failure, unspecified: Secondary | ICD-10-CM | POA: Diagnosis not present

## 2019-01-05 DIAGNOSIS — N2581 Secondary hyperparathyroidism of renal origin: Secondary | ICD-10-CM | POA: Diagnosis not present

## 2019-01-05 DIAGNOSIS — K3532 Acute appendicitis with perforation and localized peritonitis, without abscess: Secondary | ICD-10-CM | POA: Diagnosis not present

## 2019-01-05 DIAGNOSIS — N184 Chronic kidney disease, stage 4 (severe): Secondary | ICD-10-CM | POA: Insufficient documentation

## 2019-01-05 DIAGNOSIS — R600 Localized edema: Secondary | ICD-10-CM | POA: Diagnosis not present

## 2019-01-05 DIAGNOSIS — I129 Hypertensive chronic kidney disease with stage 1 through stage 4 chronic kidney disease, or unspecified chronic kidney disease: Secondary | ICD-10-CM | POA: Diagnosis not present

## 2019-01-05 DIAGNOSIS — N189 Chronic kidney disease, unspecified: Secondary | ICD-10-CM | POA: Diagnosis not present

## 2019-01-05 DIAGNOSIS — E875 Hyperkalemia: Secondary | ICD-10-CM | POA: Diagnosis not present

## 2019-01-05 DIAGNOSIS — D631 Anemia in chronic kidney disease: Secondary | ICD-10-CM | POA: Diagnosis not present

## 2019-01-05 DIAGNOSIS — K8020 Calculus of gallbladder without cholecystitis without obstruction: Secondary | ICD-10-CM | POA: Diagnosis not present

## 2019-01-05 LAB — POCT HEMOGLOBIN-HEMACUE: Hemoglobin: 9.4 g/dL — ABNORMAL LOW (ref 13.0–17.0)

## 2019-01-05 MED ORDER — EPOETIN ALFA 10000 UNIT/ML IJ SOLN
10000.0000 [IU] | INTRAMUSCULAR | Status: DC
  Administered 2019-01-05: 10000 [IU] via SUBCUTANEOUS

## 2019-01-05 MED ORDER — EPOETIN ALFA 10000 UNIT/ML IJ SOLN
INTRAMUSCULAR | Status: AC
  Filled 2019-01-05: qty 1

## 2019-01-05 MED ORDER — SODIUM CHLORIDE 0.9 % IV SOLN
510.0000 mg | INTRAVENOUS | Status: DC
  Administered 2019-01-05: 510 mg via INTRAVENOUS
  Filled 2019-01-05: qty 510

## 2019-01-05 NOTE — Discharge Instructions (Signed)

## 2019-01-10 ENCOUNTER — Other Ambulatory Visit: Payer: Self-pay

## 2019-01-10 NOTE — Addendum Note (Signed)
Addended by: York Cerise C on: 01/10/2019 02:26 PM   Modules accepted: Orders

## 2019-01-11 ENCOUNTER — Other Ambulatory Visit: Payer: Self-pay | Admitting: Physician Assistant

## 2019-01-12 ENCOUNTER — Other Ambulatory Visit: Payer: Self-pay

## 2019-01-12 ENCOUNTER — Ambulatory Visit (HOSPITAL_COMMUNITY)
Admission: RE | Admit: 2019-01-12 | Discharge: 2019-01-12 | Disposition: A | Discharge status: Home / Self Care | Payer: PPO | Source: Ambulatory Visit | Attending: Nephrology | Admitting: Nephrology

## 2019-01-12 VITALS — BP 118/69 | HR 68 | Temp 97.5°F | Resp 20 | Ht 67.0 in | Wt 212.0 lb

## 2019-01-12 DIAGNOSIS — N184 Chronic kidney disease, stage 4 (severe): Secondary | ICD-10-CM | POA: Insufficient documentation

## 2019-01-12 MED ORDER — EPOETIN ALFA 10000 UNIT/ML IJ SOLN
10000.0000 [IU] | INTRAMUSCULAR | Status: DC
  Administered 2019-01-12: 10000 [IU] via SUBCUTANEOUS

## 2019-01-12 MED ORDER — EPOETIN ALFA 10000 UNIT/ML IJ SOLN
INTRAMUSCULAR | Status: AC
  Filled 2019-01-12: qty 1

## 2019-01-12 MED ORDER — SODIUM CHLORIDE 0.9 % IV SOLN
510.0000 mg | INTRAVENOUS | Status: DC
  Administered 2019-01-12: 510 mg via INTRAVENOUS
  Filled 2019-01-12: qty 510

## 2019-01-16 LAB — POCT HEMOGLOBIN-HEMACUE: Hemoglobin: 10.2 g/dL — ABNORMAL LOW (ref 13.0–17.0)

## 2019-01-17 DIAGNOSIS — H16212 Exposure keratoconjunctivitis, left eye: Secondary | ICD-10-CM | POA: Diagnosis not present

## 2019-01-17 DIAGNOSIS — H35372 Puckering of macula, left eye: Secondary | ICD-10-CM | POA: Diagnosis not present

## 2019-01-17 DIAGNOSIS — H04123 Dry eye syndrome of bilateral lacrimal glands: Secondary | ICD-10-CM | POA: Diagnosis not present

## 2019-01-17 DIAGNOSIS — Z794 Long term (current) use of insulin: Secondary | ICD-10-CM | POA: Diagnosis not present

## 2019-01-17 DIAGNOSIS — E113393 Type 2 diabetes mellitus with moderate nonproliferative diabetic retinopathy without macular edema, bilateral: Secondary | ICD-10-CM | POA: Diagnosis not present

## 2019-01-17 DIAGNOSIS — Z961 Presence of intraocular lens: Secondary | ICD-10-CM | POA: Diagnosis not present

## 2019-01-17 LAB — HM DIABETES EYE EXAM

## 2019-01-18 ENCOUNTER — Encounter: Payer: Self-pay | Admitting: Family Medicine

## 2019-01-19 ENCOUNTER — Encounter (HOSPITAL_COMMUNITY)
Admission: RE | Admit: 2019-01-19 | Discharge: 2019-01-19 | Disposition: A | Discharge status: Home / Self Care | Payer: PPO | Source: Ambulatory Visit | Attending: Nephrology | Admitting: Nephrology

## 2019-01-19 ENCOUNTER — Other Ambulatory Visit: Payer: Self-pay

## 2019-01-19 VITALS — BP 167/88 | HR 86 | Temp 97.2°F | Resp 20

## 2019-01-19 DIAGNOSIS — N184 Chronic kidney disease, stage 4 (severe): Secondary | ICD-10-CM | POA: Insufficient documentation

## 2019-01-19 LAB — IRON AND TIBC
Iron: 50 ug/dL (ref 45–182)
Saturation Ratios: 19 % (ref 17.9–39.5)
TIBC: 270 ug/dL (ref 250–450)
UIBC: 220 ug/dL

## 2019-01-19 LAB — FERRITIN: Ferritin: 396 ng/mL — ABNORMAL HIGH (ref 24–336)

## 2019-01-19 LAB — POCT HEMOGLOBIN-HEMACUE: Hemoglobin: 10.3 g/dL — ABNORMAL LOW (ref 13.0–17.0)

## 2019-01-19 MED ORDER — EPOETIN ALFA 10000 UNIT/ML IJ SOLN
INTRAMUSCULAR | Status: AC
  Filled 2019-01-19: qty 1

## 2019-01-19 MED ORDER — EPOETIN ALFA 10000 UNIT/ML IJ SOLN
10000.0000 [IU] | INTRAMUSCULAR | Status: DC
  Administered 2019-01-19: 10000 [IU] via SUBCUTANEOUS

## 2019-01-26 ENCOUNTER — Ambulatory Visit (HOSPITAL_COMMUNITY)
Admission: RE | Admit: 2019-01-26 | Discharge: 2019-01-26 | Disposition: A | Discharge status: Home / Self Care | Payer: PPO | Source: Ambulatory Visit | Attending: Nephrology | Admitting: Nephrology

## 2019-01-26 ENCOUNTER — Other Ambulatory Visit: Payer: Self-pay

## 2019-01-26 VITALS — BP 172/85 | HR 83 | Temp 97.1°F | Resp 20

## 2019-01-26 DIAGNOSIS — N184 Chronic kidney disease, stage 4 (severe): Secondary | ICD-10-CM | POA: Insufficient documentation

## 2019-01-26 LAB — POCT HEMOGLOBIN-HEMACUE: Hemoglobin: 11.8 g/dL — ABNORMAL LOW (ref 13.0–17.0)

## 2019-01-26 MED ORDER — EPOETIN ALFA 10000 UNIT/ML IJ SOLN
10000.0000 [IU] | INTRAMUSCULAR | Status: DC
  Administered 2019-01-26: 10000 [IU] via SUBCUTANEOUS

## 2019-01-26 MED ORDER — EPOETIN ALFA 10000 UNIT/ML IJ SOLN
INTRAMUSCULAR | Status: AC
  Filled 2019-01-26: qty 1

## 2019-02-01 ENCOUNTER — Encounter: Payer: Self-pay | Admitting: Family Medicine

## 2019-02-01 ENCOUNTER — Ambulatory Visit (INDEPENDENT_AMBULATORY_CARE_PROVIDER_SITE_OTHER): Payer: PPO | Admitting: Physician Assistant

## 2019-02-01 ENCOUNTER — Encounter: Payer: Self-pay | Admitting: Physician Assistant

## 2019-02-01 VITALS — BP 130/70 | HR 72 | Temp 98.2°F | Ht 67.0 in | Wt 210.5 lb

## 2019-02-01 DIAGNOSIS — L089 Local infection of the skin and subcutaneous tissue, unspecified: Secondary | ICD-10-CM

## 2019-02-01 DIAGNOSIS — L608 Other nail disorders: Secondary | ICD-10-CM

## 2019-02-01 DIAGNOSIS — S90414A Abrasion, right lesser toe(s), initial encounter: Secondary | ICD-10-CM

## 2019-02-01 MED ORDER — DOXYCYCLINE HYCLATE 100 MG PO TABS: 100.0000 mg | ORAL_TABLET | Freq: Two times a day (BID) | ORAL | 0 refills | Status: DC

## 2019-02-01 NOTE — Patient Instructions (Signed)
It was great to see you!  Start oral doxycyline. If any worsening symptoms after starting this or new symptoms (such as fever) we need to know ASAP.  I'm putting in a referral to a podiatrist, someone will be in touch soon.  Take care,  Inda Coke PA-C

## 2019-02-01 NOTE — Progress Notes (Signed)
Rodney Farley is a 85 y.o. male here for a new problem.   History of Present Illness:   Chief Complaint  Patient presents with   cuts on 2nd &3rd toes of Rt foot    HPI   Reports new onset of lesions to 2nd and 3rd toes of R foot. Denies known trauma to this area. He is diabetic and currently undergoing fistula placement for possible HD in future. He denies: pus from lesion, fever, pain, chills, prior sores/lesion on feet. Area has slightly gotten more red with time. Has not tried anything for his symptoms.  He has a thickened toenail on R great toe after he had some trauma to it a month or so ago. He feels like it may fall off soon. Inquiring about podiatrist.  Past Medical History:  Diagnosis Date   AICD (automatic cardioverter/defibrillator) present    AKI (acute kidney injury) ([place]) 03/27/2017   Anemia    Anginal pain ([place])    Anxiety    Arm pain 05/08/2015   LEFT ARM   Atrioventricular block, complete ([place])    a. 2010 s/p pacemaker.   Bell's palsy    left side. after shingles episode   Bilateral renal cysts 07/23/2017   Simple and hemorrhagic noted on CT ab/pelvis    BPH associated with nocturia    Cardiomegaly    Chronic systolic CHF (congestive heart failure) ([place])    a. LVEF 35-40% by echo 09/2014.   CKD (chronic kidney disease), stage IV (HCC)    COPD (chronic obstructive pulmonary disease) (HCC)    Severe   Coronary artery disease    a. s/p MI in 1994/1995 while in [place] s/p questionable PCI. 03/2015: progression of disease, for staged PCI.   Depression    Diabetic peripheral neuropathy (HCC)    Diarrhea    Diverticulitis    DOE (dyspnea on exertion) 03/28/2015   Gallstones    GERD (gastroesophageal reflux disease)    Gout    Hard of hearing    History of chronic pancreatitis 07/23/2017   noted on CT abd/pelvis   History of shingles    Hypercholesterolemia    Hypertension    Ischemic cardiomyopathy    MI  (myocardial infarction) ([place]) 1994; 1995   Neuropathy    IN LOWER EXTREMITIES   Nosebleed 10/06/2017   for 2 months most recent 10/06/2017   Obesity    Pacemaker    medtronic>>> MDT ICD 09/23/15   Ruptured appendicitis    Sleep apnea    "sleeps w/humidifyer when he panics and gets short of breath" (04/08/2015)   TIA (transient ischemic attack) X 3   Trigger middle finger of left hand    Type II diabetes mellitus ([place])      Social History   Socioeconomic History   Marital status: Married    Spouse name: Not on file   Number of children: 1   Years of education: college   Highest education level: Not on file  Occupational History   Occupation: Retired  Scientist, product/process development strain: Not on file   Food insecurity    Worry: Not on file    Inability: Not on Lexicographer needs    Medical: Not on file    Non-medical: Not on file  Tobacco Use   Smoking status: Former Smoker    Packs/day: 1.50    Years: 54.00    Pack years: 81.00    Types: Cigarettes    Quit  date: 07/18/2007    Years since quitting: 11.5   Smokeless tobacco: Never Used   Tobacco comment: did not discuss LdCT but being evaluated for chole currently  Substance and Sexual Activity   Alcohol use: Yes    Comment: 04/08/2015 "probably 3 drinks/month"   Drug use: No   Sexual activity: Never  Lifestyle   Physical activity    Days per week: Not on file    Minutes per session: Not on file   Stress: Not on file  Relationships   Social connections    Talks on phone: Not on file    Gets together: Not on file    Attends religious service: Not on file    Active member of club or organization: Not on file    Attends meetings of clubs or organizations: Not on file    Relationship status: Not on file   Intimate partner violence    Fear of current or ex partner: Not on file    Emotionally abused: Not on file    Physically abused: Not on file    Forced sexual activity: Not  on file  Other Topics Concern   Not on file  Social History Narrative   Married 1994 (together since 1989) 1 son, 1 stepson. 3 grandkids, 6 great grandkids      Retired from [employer]. Had 2 years of collge.       Faith: Mormon    Past Surgical History:  Procedure Laterality Date   CARDIAC CATHETERIZATION N/A 03/29/2015   Procedure: Right/Left Heart Cath and Coronary Angiography;  Surgeon: Irfan M [name], MD;  Location: [facility];  Service: Cardiovascular;  Laterality: N/A;   [place]   "after my MI; put me on heart RX after cath"   CARDIAC CATHETERIZATION N/A 04/09/2015   Procedure: Coronary Stent Intervention;  Surgeon: Jaycion M [name], MD;  Location: [facility];  Service: Cardiovascular;  Laterality: N/A;   CHOLECYSTECTOMY N/A 10/13/2017   Procedure: LAPAROSCOPIC CHOLECYSTECTOMY WITH LYSIS OF ADHESIONS;  Surgeon: Ileana Roup, MD;  Location: [facility];  Service: General;  Laterality: N/A;   COLONOSCOPY     DENTAL SURGERY     EP IMPLANTABLE DEVICE N/A 09/23/2015   MDT CRT-D, Dr. Caryl Comes   HIATAL HERNIA REPAIR  1977   ILEOCECETOMY N/A 03/27/2017   Procedure: ILEOCECECTOMY;  Surgeon: Ileana Roup, MD;  Location: [facility];  Service: General;  Laterality: N/A;   INSERT / REPLACE / REMOVE PACEMAKER  07/2008   Complete heart block status post DDD with good function   LAPAROTOMY N/A 03/27/2017   Procedure: EXPLORATORY LAPAROTOMY;  Surgeon: Ileana Roup, MD;  Location: [facility];  Service: General;  Laterality: N/A;   TONSILLECTOMY     UPPER GI ENDOSCOPY      Family History  Problem Relation Age of Onset   Stroke Mother    Leukemia Father    Stroke Sister    Heart attack Brother     Allergies  Allergen Reactions   Bee Venom Anaphylaxis   Lyrica [Pregabalin] Other (See Comments)    hallucinations   Prednisone Other (See Comments)    hallucinations   Zocor [Simvastatin] Nausea Only and Other (See Comments)      Headache with brand name only.  Can take the generic.    Current Medications:   Current Outpatient Medications:    albuterol (PROVENTIL HFA;VENTOLIN HFA) 108 (90 Base) MCG/ACT inhaler, Inhale 2 puffs into the lungs every  6 (six) hours as needed for wheezing or shortness of breath., Disp: 1 Inhaler, Rfl: 2   aspirin EC 81 MG tablet, Take 81 mg by mouth daily., Disp: , Rfl:    atorvastatin (LIPITOR) 40 MG tablet, Take 1 tablet (40 mg total) by mouth daily. (Patient taking differently: Take 40 mg by mouth at bedtime. ), Disp: 90 tablet, Rfl: 3   calcitRIOL (ROCALTROL) 0.25 MCG capsule, Take 0.25 mcg by mouth daily. , Disp: , Rfl:    Carboxymethylcellul-Glycerin (LUBRICATING EYE DROPS OP), Place 1 drop into both eyes 3 (three) times daily as needed (dry eyes)., Disp: , Rfl:    carvedilol (COREG) 12.5 MG tablet, Take 12.5 mg by mouth 2 (two) times daily., Disp: , Rfl:    Epoetin Alfa (PROCRIT IJ), Inject 1 Dose as directed every Thursday. Through Dr. Justin Mend , Disp: , Rfl:    erythromycin ophthalmic ointment, Place 1 application into the left eye at bedtime., Disp: , Rfl:    furosemide (LASIX) 40 MG tablet, Take 1 tablet by mouth twice daily, Disp: 60 tablet, Rfl: 5   gabapentin (NEURONTIN) 100 MG capsule, TAKE 2 CAPSULES BY MOUTH AT BEDTIME (Patient taking differently: Take 200 mg by mouth at bedtime. ), Disp: 180 capsule, Rfl: 1   glucose blood (FREESTYLE TEST STRIPS) test strip, Use to check blood sugar daily, Disp: 100 each, Rfl: 3   glucose monitoring kit (FREESTYLE) monitoring kit, USE TO MONITOR BLOOD GLUCOSE AS DIRECTED, Disp: 1 each, Rfl: 1   Insulin Glargine (LANTUS SOLOSTAR) 100 UNIT/ML Solostar Pen, Inject 15 Units into the skin daily. (Patient taking differently: Inject 15 Units into the skin at bedtime. ), Disp: 15 mL, Rfl: 2   Insulin Pen Needle (UNIFINE PENTIPS) 31G X 5 MM MISC, USE AS DIRECTED, Disp: 100 each, Rfl: 0   isosorbide mononitrate (IMDUR) 60 MG 24 hr tablet,  TAKE 1 TABLET BY MOUTH ONCE DAILY (Patient taking differently: Take 60 mg by mouth at bedtime. ** DO NOT CRUSH **), Disp: 90 tablet, Rfl: 2   lansoprazole (PREVACID) 15 MG capsule, Take 1 capsule (15 mg total) by mouth daily at 12 noon., Disp: 90 capsule, Rfl: 3   nitroGLYCERIN (NITROSTAT) 0.4 MG SL tablet, Place 1 tablet (0.4 mg total) under the tongue every 5 (five) minutes as needed for chest pain. 2 doses before calling for emergent help, Disp: 25 tablet, Rfl: 3   oxymetazoline (AFRIN) 0.05 % nasal spray, Place 1 spray into both nostrils 2 (two) times daily as needed for congestion., Disp: , Rfl:    sertraline (ZOLOFT) 100 MG tablet, TAKE 1 & 1/2 (ONE & ONE-HALF) TABLETS BY MOUTH ONCE DAILY (Patient taking differently: Take 150 mg by mouth every evening. ), Disp: 135 tablet, Rfl: 0   sodium bicarbonate 650 MG tablet, Take 650 mg by mouth 2 (two) times daily., Disp: , Rfl:    tamsulosin (FLOMAX) 0.4 MG CAPS capsule, TAKE 1 CAPSULE BY MOUTH ONCE DAILY AFTER SUPPER (Patient taking differently: Take 0.4 mg by mouth daily after supper. ), Disp: 90 capsule, Rfl: 3   triamcinolone cream (KENALOG) 0.1 %, APPLY TO CREAM EXTERNALLY AFFECTED AREA TWICE DAILY 7 TO FOR 10 DAYS (Patient taking differently: Apply 1 application topically daily as needed (itching). ), Disp: 80 g, Rfl: 0   vitamin B-12 (CYANOCOBALAMIN) 1000 MCG tablet, Take 1,000 mcg by mouth daily., Disp: , Rfl:    doxycycline (VIBRA-TABS) 100 MG tablet, Take 1 tablet (100 mg total) by mouth 2 (two) times daily., Disp:  20 tablet, Rfl: 0   Review of Systems:   ROS  Negative unless otherwise specified per HPI.  Vitals:   Vitals:   02/01/19 1549  BP: 130/70  Pulse: 72  Temp: 98.2 F (36.8 C)  TempSrc: Oral  SpO2: 96%  Weight: 210 lb 8 oz (95.5 kg)  Height: _0  (1.702 m)     Body mass index is 32.97 kg/m.  Physical Exam:   Physical Exam Vitals signs and nursing note reviewed.  Constitutional:      General: He is not in  acute distress.    Appearance: He is well-developed. He is not ill-appearing or toxic-appearing.  Cardiovascular:     Rate and Rhythm: Normal rate and regular rhythm.     Pulses: Normal pulses.     Heart sounds: Normal heart sounds, S1 normal and S2 normal.     Comments: No LE edema Pulmonary:     Effort: Pulmonary effort is normal.     Breath sounds: Normal breath sounds.  Feet:     Comments: R great toenail yellow and thickened   Distal phalanx of 2nd and 3rd R toes with erythematous abrasions and crusting, no discharge or pain with palpation  Normal sensation to sole of foot and palpable pulses of DP and PT areas Skin:    General: Skin is warm and dry.  Neurological:     Mental Status: He is alert.     GCS: GCS eye subscore is 4. GCS verbal subscore is 5. GCS motor subscore is 6.  Psychiatric:        Speech: Speech normal.        Behavior: Behavior normal. Behavior is cooperative.        Assessment and Plan:   Osamu was seen today for cuts on 2nd &3rd toes of rt foot.  Diagnoses and all orders for this visit:  Infected abrasion of toe of right foot, initial encounter Concern for infection. Start oral doxycycline. Very low threshold for return visit. Worsening precautions advised.  -     Ambulatory referral to Podiatry  Toenail deformity Referral to podiatry. -     Ambulatory referral to Podiatry  Other orders -     doxycycline (VIBRA-TABS) 100 MG tablet; Take 1 tablet (100 mg total) by mouth 2 (two) times daily.     Reviewed expectations re: course of current medical issues.  Discussed self-management of symptoms.  Outlined signs and symptoms indicating need for more acute intervention.  Patient verbalized understanding and all questions were answered.  See orders for this visit as documented in the electronic medical record.  Patient received an After-Visit Summary.    Inda Coke, PA-C

## 2019-02-02 ENCOUNTER — Ambulatory Visit (HOSPITAL_COMMUNITY)
Admission: RE | Admit: 2019-02-02 | Discharge: 2019-02-02 | Disposition: A | Discharge status: Home / Self Care | Payer: PPO | Source: Ambulatory Visit | Attending: Nephrology | Admitting: Nephrology

## 2019-02-02 ENCOUNTER — Other Ambulatory Visit: Payer: Self-pay

## 2019-02-02 ENCOUNTER — Other Ambulatory Visit: Payer: Self-pay | Admitting: Family Medicine

## 2019-02-02 VITALS — BP 170/90 | HR 89 | Temp 97.3°F | Resp 20

## 2019-02-02 DIAGNOSIS — N184 Chronic kidney disease, stage 4 (severe): Secondary | ICD-10-CM | POA: Insufficient documentation

## 2019-02-02 LAB — POCT HEMOGLOBIN-HEMACUE: Hemoglobin: 11.9 g/dL — ABNORMAL LOW (ref 13.0–17.0)

## 2019-02-02 MED ORDER — EPOETIN ALFA 10000 UNIT/ML IJ SOLN
10000.0000 [IU] | INTRAMUSCULAR | Status: DC
  Administered 2019-02-02: 10000 [IU] via SUBCUTANEOUS

## 2019-02-02 MED ORDER — EPOETIN ALFA 10000 UNIT/ML IJ SOLN
INTRAMUSCULAR | Status: AC
  Filled 2019-02-02: qty 1

## 2019-02-03 ENCOUNTER — Other Ambulatory Visit (HOSPITAL_COMMUNITY)
Admission: RE | Admit: 2019-02-03 | Discharge: 2019-02-03 | Disposition: A | Discharge status: Home / Self Care | Payer: PPO | Source: Ambulatory Visit | Attending: Vascular Surgery | Admitting: Vascular Surgery

## 2019-02-03 DIAGNOSIS — Z1159 Encounter for screening for other viral diseases: Secondary | ICD-10-CM | POA: Insufficient documentation

## 2019-02-03 NOTE — Progress Notes (Signed)
Pt came to the [facility] drive-thru testing site, and refused to get tested today. Pt sts the last time he got tested his nose would not stop bleeding, and the test results took to long to come back. Pt was advised to call the doctor's office to cancel his procedure, since he refused to be tested for COVID. Pt sts he is not canceling his procedure, but we could draw blood to test him.

## 2019-02-03 NOTE — Progress Notes (Signed)
Kay from Dr. Claretha Cooper office made aware of the pt refusing to be tested for COVID prior to his procedure on 02/07/2019.

## 2019-02-04 LAB — SARS CORONAVIRUS 2 (TAT 6-24 HRS): SARS Coronavirus 2: NEGATIVE

## 2019-02-06 ENCOUNTER — Encounter (HOSPITAL_COMMUNITY): Payer: Self-pay | Admitting: *Deleted

## 2019-02-06 ENCOUNTER — Other Ambulatory Visit: Payer: Self-pay

## 2019-02-06 NOTE — Progress Notes (Signed)
Pt denies SOB and chest pain. Pt stated that he is under the care of Dr [name] and Dr. Caryl Comes (EP), Cardiology. Medtronic reps and Lindsi, Therapist, sports, emailed per protocol. Peri-op Prescription for ICD initiated; awaiting a response. Pt stated that PCP is Dr. Yong Channel. Pt denies having a chest x ray within the last year. Pt made aware to stop taking vitamins, fish oil and herbal medications. Do not take any NSAIDs ie: Ibuprofen, Advil, Naproxen (Aleve), Motrin, BC and Goody Powder.  Pt made aware to take only 7 units of Lantus at HS. Pt made aware to check BG every 2 hours prior to arrival to hospital on DOS. Pt made aware to treat a BG < 70 with 4 glucose tabs or glucose gel or 4 ounces of apple or cranberry juice, wait 15 minutes after intervention to recheck BG, if BG remains < 70, call Short Stay unit to speak with a nurse.  Pt denies that he and family members tested positive for COVID-19 ( pt tested on 02/03/19; pt reminded to quarantine).  Coronavirus Screening  Pt denies that he and family experienced the following symptoms:  Cough yes/no: No Fever (>100.73F)  yes/no: No Runny nose yes/no: No Sore throat yes/no: No Difficulty breathing/shortness of breath  yes/no: No  Have you or a family member traveled in the last 14 days and where? yes/no: No  Pt reminded that hospital visitation restrictions are in effect and the importance of the restrictions.   Pt verbalized understanding of all pre-op instructions.  PA, Anesthesiology,  asked to review pt history; see note.

## 2019-02-06 NOTE — Anesthesia Preprocedure Evaluation (Addendum)
Anesthesia Evaluation  Patient identified by MRN, date of birth, ID band Patient awake    Reviewed: Allergy & Precautions, NPO status , Patient's Chart, lab work & pertinent test results, reviewed documented beta blocker date and time   History of Anesthesia Complications (+) history of anesthetic complications (cholecystectomy: post op ischemia)  Airway Mallampati: II  TM Distance: >3 FB Neck ROM: Full    Dental  (+) Upper Dentures, Lower Dentures   Pulmonary sleep apnea , COPD,  COPD inhaler, former smoker,    breath sounds clear to auscultation       Cardiovascular hypertension, Pt. on medications and Pt. on home beta blockers + CAD, + Past MI and + Cardiac Stents  + pacemaker (complete AV block: BiV pacer) + Cardiac Defibrillator  Rhythm:Regular Rate:Normal  '19 ECHO: EF 60-65%, mild MR   Neuro/Psych Anxiety Depression TIA   GI/Hepatic Neg liver ROS, GERD  Controlled and Medicated,  Endo/Other  diabetes, Insulin Dependent  Renal/GU Renal disease (no dialysis yet, K+ 4.9)     Musculoskeletal   Abdominal (+) + obese,   Peds  Hematology negative hematology ROS (+)   Anesthesia Other Findings   Reproductive/Obstetrics                           Anesthesia Physical Anesthesia Plan  ASA: IV  Anesthesia Plan: MAC   Post-op Pain Management:    Induction:   PONV Risk Score and Plan: 1 and Treatment may vary due to age or medical condition and Ondansetron  Airway Management Planned: Natural Airway and Simple Face Mask  Additional Equipment:   Intra-op Plan:   Post-operative Plan:   Informed Consent: I have reviewed the patients History and Physical, chart, labs and discussed the procedure including the risks, benefits and alternatives for the proposed anesthesia with the patient or authorized representative who has indicated his/her understanding and acceptance.     Dental advisory  given  Plan Discussed with: CRNA and Surgeon  Anesthesia Plan Comments: (Follows with cardiology for CAD, (s/p DES x 3 2016), CHB (S/p Medtronic BiV ICD), ICM (EF normalized by Echo 2019).   CKD V not yet on HD, followed by Dr. Justin Mend.  TTE 10/15/17: - Left ventricle: The cavity size was normal. There was severe   concentric hypertrophy. Systolic function was normal. The   estimated ejection fraction was in the range of 60% to 65%. Wall   motion was normal; there were no regional wall motion   abnormalities. The study was not technically sufficient to allow   evaluation of LV diastolic dysfunction due to atrial   fibrillation. Doppler parameters are consistent with high   ventricular filling pressure. - Ventricular septum: Septal motion showed moderate paradox. These   changes are consistent with right ventricular pacing. - Aortic valve: Trileaflet; mildly thickened, moderately calcified   leaflets. - Mitral valve: There was mild regurgitation. - Left atrium: The atrium was mildly dilated.)      Anesthesia Quick Evaluation

## 2019-02-07 ENCOUNTER — Encounter (HOSPITAL_COMMUNITY)
Admission: RE | Disposition: A | Discharge status: Home / Self Care | Payer: Self-pay | Source: Home / Self Care | Attending: Vascular Surgery

## 2019-02-07 ENCOUNTER — Ambulatory Visit (HOSPITAL_COMMUNITY): Payer: PPO | Admitting: Physician Assistant

## 2019-02-07 ENCOUNTER — Ambulatory Visit (HOSPITAL_COMMUNITY)
Admission: RE | Admit: 2019-02-07 | Discharge: 2019-02-07 | Disposition: A | Discharge status: Home / Self Care | Payer: PPO | Attending: Vascular Surgery | Admitting: Vascular Surgery

## 2019-02-07 ENCOUNTER — Other Ambulatory Visit: Payer: Self-pay

## 2019-02-07 DIAGNOSIS — Z7982 Long term (current) use of aspirin: Secondary | ICD-10-CM | POA: Insufficient documentation

## 2019-02-07 DIAGNOSIS — I129 Hypertensive chronic kidney disease with stage 1 through stage 4 chronic kidney disease, or unspecified chronic kidney disease: Secondary | ICD-10-CM | POA: Diagnosis not present

## 2019-02-07 DIAGNOSIS — I255 Ischemic cardiomyopathy: Secondary | ICD-10-CM | POA: Insufficient documentation

## 2019-02-07 DIAGNOSIS — K219 Gastro-esophageal reflux disease without esophagitis: Secondary | ICD-10-CM | POA: Diagnosis not present

## 2019-02-07 DIAGNOSIS — F419 Anxiety disorder, unspecified: Secondary | ICD-10-CM | POA: Diagnosis not present

## 2019-02-07 DIAGNOSIS — E1142 Type 2 diabetes mellitus with diabetic polyneuropathy: Secondary | ICD-10-CM | POA: Diagnosis not present

## 2019-02-07 DIAGNOSIS — I130 Hypertensive heart and chronic kidney disease with heart failure and stage 1 through stage 4 chronic kidney disease, or unspecified chronic kidney disease: Secondary | ICD-10-CM | POA: Diagnosis not present

## 2019-02-07 DIAGNOSIS — G4730 Sleep apnea, unspecified: Secondary | ICD-10-CM | POA: Insufficient documentation

## 2019-02-07 DIAGNOSIS — I252 Old myocardial infarction: Secondary | ICD-10-CM | POA: Insufficient documentation

## 2019-02-07 DIAGNOSIS — Z794 Long term (current) use of insulin: Secondary | ICD-10-CM | POA: Insufficient documentation

## 2019-02-07 DIAGNOSIS — M109 Gout, unspecified: Secondary | ICD-10-CM | POA: Insufficient documentation

## 2019-02-07 DIAGNOSIS — E1122 Type 2 diabetes mellitus with diabetic chronic kidney disease: Secondary | ICD-10-CM | POA: Diagnosis not present

## 2019-02-07 DIAGNOSIS — R351 Nocturia: Secondary | ICD-10-CM | POA: Diagnosis not present

## 2019-02-07 DIAGNOSIS — Z8639 Personal history of other endocrine, nutritional and metabolic disease: Secondary | ICD-10-CM | POA: Diagnosis not present

## 2019-02-07 DIAGNOSIS — E7800 Pure hypercholesterolemia, unspecified: Secondary | ICD-10-CM | POA: Diagnosis not present

## 2019-02-07 DIAGNOSIS — I2510 Atherosclerotic heart disease of native coronary artery without angina pectoris: Secondary | ICD-10-CM | POA: Diagnosis not present

## 2019-02-07 DIAGNOSIS — J449 Chronic obstructive pulmonary disease, unspecified: Secondary | ICD-10-CM | POA: Diagnosis not present

## 2019-02-07 DIAGNOSIS — Z6832 Body mass index (BMI) 32.0-32.9, adult: Secondary | ICD-10-CM | POA: Diagnosis not present

## 2019-02-07 DIAGNOSIS — N401 Enlarged prostate with lower urinary tract symptoms: Secondary | ICD-10-CM | POA: Diagnosis not present

## 2019-02-07 DIAGNOSIS — Z8673 Personal history of transient ischemic attack (TIA), and cerebral infarction without residual deficits: Secondary | ICD-10-CM | POA: Diagnosis not present

## 2019-02-07 DIAGNOSIS — N184 Chronic kidney disease, stage 4 (severe): Secondary | ICD-10-CM | POA: Diagnosis not present

## 2019-02-07 DIAGNOSIS — E669 Obesity, unspecified: Secondary | ICD-10-CM | POA: Diagnosis not present

## 2019-02-07 DIAGNOSIS — Z87891 Personal history of nicotine dependence: Secondary | ICD-10-CM | POA: Insufficient documentation

## 2019-02-07 DIAGNOSIS — Z79899 Other long term (current) drug therapy: Secondary | ICD-10-CM | POA: Insufficient documentation

## 2019-02-07 DIAGNOSIS — N185 Chronic kidney disease, stage 5: Secondary | ICD-10-CM | POA: Diagnosis not present

## 2019-02-07 DIAGNOSIS — Z95810 Presence of automatic (implantable) cardiac defibrillator: Secondary | ICD-10-CM | POA: Diagnosis not present

## 2019-02-07 DIAGNOSIS — F418 Other specified anxiety disorders: Secondary | ICD-10-CM | POA: Diagnosis not present

## 2019-02-07 DIAGNOSIS — F329 Major depressive disorder, single episode, unspecified: Secondary | ICD-10-CM | POA: Diagnosis not present

## 2019-02-07 HISTORY — DX: Presence of spectacles and contact lenses: Z97.3

## 2019-02-07 HISTORY — DX: Complete loss of teeth, unspecified cause, unspecified class: K08.109

## 2019-02-07 HISTORY — DX: Cardiac murmur, unspecified: R01.1

## 2019-02-07 HISTORY — DX: Unspecified osteoarthritis, unspecified site: M19.90

## 2019-02-07 HISTORY — DX: Presence of external hearing-aid: Z97.4

## 2019-02-07 HISTORY — PX: AV FISTULA PLACEMENT: SHX1204

## 2019-02-07 LAB — POCT I-STAT 4, (NA,K, GLUC, HGB,HCT)
Glucose, Bld: 86 mg/dL (ref 70–99)
HCT: 34 % — ABNORMAL LOW (ref 39.0–52.0)
Hemoglobin: 11.6 g/dL — ABNORMAL LOW (ref 13.0–17.0)
Potassium: 4.9 mmol/L (ref 3.5–5.1)
Sodium: 140 mmol/L (ref 135–145)

## 2019-02-07 LAB — GLUCOSE, CAPILLARY
Glucose-Capillary: 87 mg/dL (ref 70–99)
Glucose-Capillary: 94 mg/dL (ref 70–99)

## 2019-02-07 SURGERY — ARTERIOVENOUS (AV) FISTULA CREATION
Anesthesia: Monitor Anesthesia Care | Site: Arm Upper | Laterality: Right

## 2019-02-07 MED ORDER — PROPOFOL 500 MG/50ML IV EMUL
INTRAVENOUS | Status: DC | PRN
  Administered 2019-02-07: 50 ug/kg/min via INTRAVENOUS

## 2019-02-07 MED ORDER — EPHEDRINE 5 MG/ML INJ
INTRAVENOUS | Status: AC
  Filled 2019-02-07: qty 10

## 2019-02-07 MED ORDER — PROMETHAZINE HCL 25 MG/ML IJ SOLN: 6.2500 mg | INTRAMUSCULAR | Status: DC | PRN

## 2019-02-07 MED ORDER — LIDOCAINE 2% (20 MG/ML) 5 ML SYRINGE
INTRAMUSCULAR | Status: AC
  Filled 2019-02-07: qty 5

## 2019-02-07 MED ORDER — FENTANYL CITRATE (PF) 100 MCG/2ML IJ SOLN: 25.0000 ug | INTRAMUSCULAR | Status: DC | PRN

## 2019-02-07 MED ORDER — CHLORHEXIDINE GLUCONATE 4 % EX LIQD: 60.0000 mL | Freq: Once | CUTANEOUS | Status: DC

## 2019-02-07 MED ORDER — FENTANYL CITRATE (PF) 250 MCG/5ML IJ SOLN
INTRAMUSCULAR | Status: AC
  Filled 2019-02-07: qty 5

## 2019-02-07 MED ORDER — SUCCINYLCHOLINE CHLORIDE 200 MG/10ML IV SOSY
PREFILLED_SYRINGE | INTRAVENOUS | Status: AC
  Filled 2019-02-07: qty 10

## 2019-02-07 MED ORDER — OXYCODONE-ACETAMINOPHEN 5-325 MG PO TABS: 1.0000 | ORAL_TABLET | ORAL | 0 refills | Status: DC | PRN

## 2019-02-07 MED ORDER — MEPERIDINE HCL 25 MG/ML IJ SOLN: 6.2500 mg | INTRAMUSCULAR | Status: DC | PRN

## 2019-02-07 MED ORDER — LIDOCAINE-EPINEPHRINE (PF) 1 %-1:200000 IJ SOLN
INTRAMUSCULAR | Status: DC | PRN
  Administered 2019-02-07: 10 mL

## 2019-02-07 MED ORDER — MIDAZOLAM HCL 2 MG/2ML IJ SOLN: 0.5000 mg | Freq: Once | INTRAMUSCULAR | Status: DC | PRN

## 2019-02-07 MED ORDER — ONDANSETRON HCL 4 MG/2ML IJ SOLN
INTRAMUSCULAR | Status: AC
  Filled 2019-02-07: qty 2

## 2019-02-07 MED ORDER — SODIUM CHLORIDE 0.9 % IV SOLN
INTRAVENOUS | Status: DC | PRN
  Administered 2019-02-07: 500 mL

## 2019-02-07 MED ORDER — EPHEDRINE SULFATE 50 MG/ML IJ SOLN
INTRAMUSCULAR | Status: DC | PRN
  Administered 2019-02-07: 5 mg via INTRAVENOUS

## 2019-02-07 MED ORDER — LIDOCAINE HCL (PF) 1 % IJ SOLN
INTRAMUSCULAR | Status: AC
  Filled 2019-02-07: qty 30

## 2019-02-07 MED ORDER — SODIUM CHLORIDE 0.9 % IV SOLN
INTRAVENOUS | Status: AC
  Filled 2019-02-07: qty 1.2

## 2019-02-07 MED ORDER — SODIUM CHLORIDE 0.9 % IV SOLN
INTRAVENOUS | Status: DC | PRN
  Administered 2019-02-07: 25 ug/min via INTRAVENOUS

## 2019-02-07 MED ORDER — 0.9 % SODIUM CHLORIDE (POUR BTL) OPTIME
TOPICAL | Status: DC | PRN
  Administered 2019-02-07: 1000 mL

## 2019-02-07 MED ORDER — MIDAZOLAM HCL 5 MG/5ML IJ SOLN
INTRAMUSCULAR | Status: DC | PRN
  Administered 2019-02-07: 1 mg via INTRAVENOUS

## 2019-02-07 MED ORDER — ROCURONIUM BROMIDE 10 MG/ML (PF) SYRINGE
PREFILLED_SYRINGE | INTRAVENOUS | Status: AC
  Filled 2019-02-07: qty 10

## 2019-02-07 MED ORDER — CEFAZOLIN SODIUM-DEXTROSE 2-4 GM/100ML-% IV SOLN
INTRAVENOUS | Status: AC
  Filled 2019-02-07: qty 100

## 2019-02-07 MED ORDER — FENTANYL CITRATE (PF) 100 MCG/2ML IJ SOLN
INTRAMUSCULAR | Status: DC | PRN
  Administered 2019-02-07 (×2): 50 ug via INTRAVENOUS

## 2019-02-07 MED ORDER — PROPOFOL 10 MG/ML IV BOLUS
INTRAVENOUS | Status: AC
  Filled 2019-02-07: qty 20

## 2019-02-07 MED ORDER — LIDOCAINE-EPINEPHRINE (PF) 1 %-1:200000 IJ SOLN
INTRAMUSCULAR | Status: AC
  Filled 2019-02-07: qty 30

## 2019-02-07 MED ORDER — MIDAZOLAM HCL 2 MG/2ML IJ SOLN
INTRAMUSCULAR | Status: AC
  Filled 2019-02-07: qty 2

## 2019-02-07 MED ORDER — LIDOCAINE HCL (CARDIAC) PF 100 MG/5ML IV SOSY
PREFILLED_SYRINGE | INTRAVENOUS | Status: DC | PRN
  Administered 2019-02-07: 40 mg via INTRAVENOUS

## 2019-02-07 MED ORDER — PROPOFOL 10 MG/ML IV BOLUS
INTRAVENOUS | Status: DC | PRN
  Administered 2019-02-07: 20 mg via INTRAVENOUS

## 2019-02-07 MED ORDER — SODIUM CHLORIDE 0.9 % IV SOLN
INTRAVENOUS | Status: DC
  Administered 2019-02-07: 08:00:00 via INTRAVENOUS

## 2019-02-07 MED ORDER — PHENYLEPHRINE 40 MCG/ML (10ML) SYRINGE FOR IV PUSH (FOR BLOOD PRESSURE SUPPORT)
PREFILLED_SYRINGE | INTRAVENOUS | Status: AC
  Filled 2019-02-07: qty 10

## 2019-02-07 MED ORDER — ONDANSETRON HCL 4 MG/2ML IJ SOLN
INTRAMUSCULAR | Status: DC | PRN
  Administered 2019-02-07: 4 mg via INTRAVENOUS

## 2019-02-07 MED ORDER — CEFAZOLIN SODIUM-DEXTROSE 2-4 GM/100ML-% IV SOLN
2.0000 g | INTRAVENOUS | Status: AC
  Administered 2019-02-07: 2 g via INTRAVENOUS

## 2019-02-07 MED ORDER — DEXAMETHASONE SODIUM PHOSPHATE 10 MG/ML IJ SOLN
INTRAMUSCULAR | Status: AC
  Filled 2019-02-07: qty 2

## 2019-02-07 SURGICAL SUPPLY — 35 items
ARMBAND PINK RESTRICT EXTREMIT (MISCELLANEOUS) ×3 IMPLANT
CANISTER SUCT 3000ML PPV (MISCELLANEOUS) ×3 IMPLANT
CLIP VESOCCLUDE MED 6/CT (CLIP) ×3 IMPLANT
CLIP VESOCCLUDE SM WIDE 6/CT (CLIP) ×3 IMPLANT
COVER PROBE W GEL 5X96 (DRAPES) ×2 IMPLANT
COVER WAND RF STERILE (DRAPES) ×3 IMPLANT
DERMABOND ADVANCED (GAUZE/BANDAGES/DRESSINGS) ×2
DERMABOND ADVANCED .7 DNX12 (GAUZE/BANDAGES/DRESSINGS) ×1 IMPLANT
ELECT REM PT RETURN 9FT ADLT (ELECTROSURGICAL) ×3
ELECTRODE REM PT RTRN 9FT ADLT (ELECTROSURGICAL) ×1 IMPLANT
GLOVE BIO SURGEON STRL SZ 6.5 (GLOVE) ×1 IMPLANT
GLOVE BIO SURGEON STRL SZ7.5 (GLOVE) ×3 IMPLANT
GLOVE BIO SURGEONS STRL SZ 6.5 (GLOVE) ×1
GLOVE BIOGEL PI IND STRL 6.5 (GLOVE) IMPLANT
GLOVE BIOGEL PI IND STRL 8.5 (GLOVE) IMPLANT
GLOVE BIOGEL PI INDICATOR 6.5 (GLOVE) ×6
GLOVE BIOGEL PI INDICATOR 8.5 (GLOVE) ×2
GLOVE SS BIOGEL STRL SZ 8 (GLOVE) IMPLANT
GLOVE SUPERSENSE BIOGEL SZ 8 (GLOVE) ×2
GOWN STRL REUS W/ TWL LRG LVL3 (GOWN DISPOSABLE) ×2 IMPLANT
GOWN STRL REUS W/ TWL XL LVL3 (GOWN DISPOSABLE) ×1 IMPLANT
GOWN STRL REUS W/TWL LRG LVL3 (GOWN DISPOSABLE) ×2
GOWN STRL REUS W/TWL XL LVL3 (GOWN DISPOSABLE) ×4
KIT BASIN OR (CUSTOM PROCEDURE TRAY) ×3 IMPLANT
KIT TURNOVER KIT B (KITS) ×3 IMPLANT
NS IRRIG 1000ML POUR BTL (IV SOLUTION) ×3 IMPLANT
PACK CV ACCESS (CUSTOM PROCEDURE TRAY) ×3 IMPLANT
PAD ARMBOARD 7.5X6 YLW CONV (MISCELLANEOUS) ×6 IMPLANT
SUT MNCRL AB 4-0 PS2 18 (SUTURE) ×3 IMPLANT
SUT PROLENE 6 0 BV (SUTURE) ×5 IMPLANT
SUT VIC AB 3-0 SH 27 (SUTURE) ×2
SUT VIC AB 3-0 SH 27X BRD (SUTURE) ×1 IMPLANT
TOWEL GREEN STERILE (TOWEL DISPOSABLE) ×3 IMPLANT
UNDERPAD 30X30 (UNDERPADS AND DIAPERS) ×3 IMPLANT
WATER STERILE IRR 1000ML POUR (IV SOLUTION) ×3 IMPLANT

## 2019-02-07 NOTE — Anesthesia Postprocedure Evaluation (Signed)
Anesthesia Post Note  Patient: Rodney Farley  Procedure(s) Performed: ARTERIOVENOUS (AV) FISTULA CREATION RIGHT UPPER ARM (Right Arm Upper)     Patient location during evaluation: PACU Anesthesia Type: MAC Level of consciousness: awake and alert, oriented and patient cooperative Pain management: pain level controlled Vital Signs Assessment: post-procedure vital signs reviewed and stable Respiratory status: spontaneous breathing, nonlabored ventilation and respiratory function stable Cardiovascular status: blood pressure returned to baseline and stable Postop Assessment: no apparent nausea or vomiting Anesthetic complications: no    Last Vitals:  Vitals:   02/07/19 0920 02/07/19 0921  BP: (!) 157/74 (!) 166/84  Pulse: 69 75  Resp: 13   Temp:  36.7 C  SpO2: 93% 94%    Last Pain:  Vitals:   02/07/19 0921  TempSrc:   PainSc: 0-No pain                 Debby Clyne,E. Pearce Littlefield

## 2019-02-07 NOTE — Op Note (Signed)
° ° °  Patient name: Rodney Farley MRN: [id number] DOB: [birth year] Sex: male  02/07/2019 Pre-operative Diagnosis: Chronic kidney disease Post-operative diagnosis:  Same Surgeon:  Eda Paschal. Donzetta Matters, MD Assistant: Linus Orn, SA Procedure Performed: Right arm brachiocephalic AV fistula creation  Indications: 85 year old male with chronic kidney disease.  He is now in need of dialysis access.  Has suitable basilic and cephalic veins on the right for fistula.  He is right-hand dominant but has left-sided AICD.  Findings: There is a large cephalic vein and a decently sized basilic vein at the antecubitum.  Unfortunately there has been an IV placed in the cephalic vein at the antecubitum right and a trifurcation.  The vein was divided at this point and spatulated.  I did have to free it up for several centimeters to allow to stretch to the brachial artery.  The brachial artery had external diameter 3 mm.  At completion there was a good thrill and cephalic vein palpable radial pulse the wrist.   Procedure:  The patient was identified in the holding area and taken to the operating room was placed supine upon table MAC anesthesia was induced.  He was to the prepped draped in the right upper extremity usual fashion antibiotics were administered and timeout was called.  Ultrasound was used to identify both the cephalic and basilic vein which were suitable.  He had unfortunately had IV placed or blood drawn right at the cephalic vein at the antecubital.  The area was anesthetized 1% lidocaine with epinephrine transverse incision was made.  We dissected down to the vein identify the trifurcation where it had been cannulated.  I marked the vein for orientation.  Divided through the deep fascia protected the basilic vein.  Vesseloops placed around the brachial artery.  I then transected the vein distally and spatulated all 3 areas.  The other veins were tied off distally.  I flushed the vein with heparinized  saline and clamped it.  I clamped the brachial artery distally proximally opened longitudinally flushed heparinized saline both directions.  I then send a large spatulated area to the 4 mm arteriotomy.  Prior to completion as muscular flushing all directions.  Upon completion there was good thrill proximally in the fistula.  We mobilized it for several centimeters dividing a few branches between ties.  I did have to repair one area with 6-0 Prolene suture.  Ultimately I did have enough slack that there was no tension in the fistula creation.  I had a good thrill in the runoff vein palpable radial artery at the wrist and these were confirmed with Doppler.  Satisfied with this we irrigated and obtain hemostasis closed in layers Vicryl Monocryl.  Dermabond placed to level skin.  He was awakened anesthesia having tolerated procedure without any complication.  All counts were correct at completion.  EBL: 20 cc   Bronson Bressman C. Donzetta Matters, MD [facility] Office: [phone] Pager: [phone]

## 2019-02-07 NOTE — Transfer of Care (Signed)
Immediate Anesthesia Transfer of Care Note  Patient: Rodney Farley  Procedure(s) Performed: ARTERIOVENOUS (AV) FISTULA CREATION RIGHT UPPER ARM (Right Arm Upper)  Patient Location: PACU  Anesthesia Type:MAC  Level of Consciousness: awake, alert  and oriented  Airway & Oxygen Therapy: Patient Spontanous Breathing and Patient connected to face mask oxygen  Post-op Assessment: Report given to RN and Post -op Vital signs reviewed and stable  Post vital signs: Reviewed and stable  Last Vitals:  Vitals Value Taken Time  BP 133/69 02/07/19 0842  Temp    Pulse 69 02/07/19 0845  Resp 12 02/07/19 0845  SpO2 96 % 02/07/19 0845  Vitals shown include unvalidated device data.  Last Pain:  Vitals:   02/07/19 0840  TempSrc:   PainSc: (P) Asleep      Patients Stated Pain Goal: 3 (50/53/97 6734)  Complications: No apparent anesthesia complications

## 2019-02-07 NOTE — H&P (Signed)
Subjective:   HPI:  Rodney Farley is a 85 y.o. male right-hand-dominant man with history of AICD placement on the left.  Has never been on dialysis before.  Does not have any upper extremity surgery in the past.  Did have a trigger finger on the left that was released that was all.  Does not take any blood thinners.  He is alone today his son is in the waiting room and is accompanying [place] via speaker phone.  Past Medical History:  Diagnosis Date  . AICD (automatic cardioverter/defibrillator) present   . AKI (acute kidney injury) ([place]) 03/27/2017  . Anemia   . Anginal pain ([place])   . Anxiety   . Arm pain 05/08/2015   LEFT ARM  . Atrioventricular block, complete ([place])    a. 2010 s/p pacemaker.  . Bell's palsy    left side. after shingles episode  . Bilateral renal cysts 07/23/2017   Simple and hemorrhagic noted on CT ab/pelvis   . BPH associated with nocturia   . Cardiomegaly   . Chronic systolic CHF (congestive heart failure) ([place])    a. LVEF 35-40% by echo 09/2014.  [name] CKD (chronic kidney disease), stage IV ([place])   . COPD (chronic obstructive pulmonary disease) (H[facility])    Severe  . Coronary artery disease    a. s/p MI in 1994/1995 while in [place] s/p questionable PCI. 03/2015: progression of disease, for staged PCI.  [name] Depression   . Diabetic peripheral neuropathy ([place])   . Diarrhea   . Diverticulitis   . DOE (dyspnea on exertion) 03/28/2015  . Gallstones   . GERD (gastroesophageal reflux disease)   . Gout   . Hard of hearing   . History of chronic pancreatitis 07/23/2017   noted on CT abd/pelvis  . History of shingles   . Hypercholesterolemia   . Hypertension   . Ischemic cardiomyopathy   . MI (myocardial infarction) ([place]) 1994; 1995  . Neuropathy    IN LOWER EXTREMITIES  . Nosebleed 10/06/2017   for 2 months most recent 10/06/2017  . Obesity   . Pacemaker    medtronic>>> MDT ICD 09/23/15  . Ruptured appendicitis   .  Sleep apnea    "sleeps w/humidifyer when he panics and gets short of breath" (04/08/2015)  . TIA (transient ischemic attack) X 3  . Trigger middle finger of left hand   . Type II diabetes mellitus (HCC)         Family History  Problem Relation Age of Onset  . Stroke Mother   . Leukemia Father   . Stroke Sister   . Heart attack Brother         Past Surgical History:  Procedure Laterality Date  . CARDIAC CATHETERIZATION N/A 03/29/2015   Procedure: Right/Left Heart Cath and Coronary Angiography;  Surgeon: Rayfield M [name], MD;  Location: [facility];  Service: Cardiovascular;  Laterality: N/A;  . [place]   "after my MI; put me on heart RX after cath"  . CARDIAC CATHETERIZATION N/A 04/09/2015   Procedure: Coronary Stent Intervention;  Surgeon: Azzam M [name], MD;  Location: [facility];  Service: Cardiovascular;  Laterality: N/A;  . CHOLECYSTECTOMY N/A 10/13/2017   Procedure: LAPAROSCOPIC CHOLECYSTECTOMY WITH LYSIS OF ADHESIONS;  Surgeon: Ileana Roup, MD;  Location: [facility];  Service: General;  Laterality: N/A;  . COLONOSCOPY    . DENTAL SURGERY    . EP IMPLANTABLE DEVICE N/A 09/23/2015   MDT CRT-D,  Dr. Caryl Comes  . HIATAL HERNIA REPAIR  1977  . ILEOCECETOMY N/A 03/27/2017   Procedure: ILEOCECECTOMY;  Surgeon: Ileana Roup, MD;  Location: [facility];  Service: General;  Laterality: N/A;  . INSERT / REPLACE / REMOVE PACEMAKER  07/2008   Complete heart block status post DDD with good function  . LAPAROTOMY N/A 03/27/2017   Procedure: EXPLORATORY LAPAROTOMY;  Surgeon: Ileana Roup, MD;  Location: [facility];  Service: General;  Laterality: N/A;  . TONSILLECTOMY    . UPPER GI ENDOSCOPY      Short Social History:  Social History        Tobacco Use  . Smoking status: Former Smoker    Packs/day: 1.50    Years: 54.00    Pack years: 81.00    Types: Cigarettes    Last attempt to quit: 07/18/2007    Years  since quitting: 11.3  . Smokeless tobacco: Never Used  . Tobacco comment: did not discuss LdCT but being evaluated for chole currently  Substance Use Topics  . Alcohol use: Yes    Comment: 04/08/2015 "probably 3 drinks/month"    Allergies  Allergen Reactions  . Bee Venom Anaphylaxis  . Lyrica [Pregabalin] Other (See Comments)    hallucinations  . Prednisone Other (See Comments)    hallucinations  . Zocor [Simvastatin] Nausea Only and Other (See Comments)    Headache with brand name only.  Can take the generic.          Current Outpatient Medications  Medication Sig Dispense Refill  . albuterol (PROVENTIL HFA;VENTOLIN HFA) 108 (90 Base) MCG/ACT inhaler Inhale 2 puffs into the lungs every 6 (six) hours as needed for wheezing or shortness of breath. 1 Inhaler 2  . aspirin EC 81 MG tablet Take 81 mg by mouth daily.    [name] atorvastatin (LIPITOR) 40 MG tablet Take 1 tablet (40 mg total) by mouth daily. 90 tablet 3  . calcitRIOL (ROCALTROL) 0.25 MCG capsule Take 1 capsule by mouth daily.    . carvedilol (COREG) 12.5 MG tablet Take 12.5 mg by mouth 2 (two) times daily.    [name] Epoetin Alfa (PROCRIT IJ) Inject as directed. Through Dr. Justin Mend    . gabapentin (NEURONTIN) 100 MG capsule TAKE 2 CAPSULES BY MOUTH AT BEDTIME 180 capsule 1  . glucose blood (FREESTYLE TEST STRIPS) test strip Use to check blood sugar daily 100 each 3  . glucose monitoring kit (FREESTYLE) monitoring kit USE TO MONITOR BLOOD GLUCOSE AS DIRECTED 1 each 1  . Insulin Glargine (LANTUS SOLOSTAR) 100 UNIT/ML Solostar Pen Inject 15 Units into the skin daily. 15 mL 2  . Insulin Pen Needle (UNIFINE PENTIPS) 31G X 5 MM MISC USE AS DIRECTED 100 each 0  . isosorbide mononitrate (IMDUR) 60 MG 24 hr tablet TAKE 1 TABLET BY MOUTH ONCE DAILY 90 tablet 2  . lansoprazole (PREVACID) 15 MG capsule Take 1 capsule (15 mg total) by mouth daily at 12 noon. 90 capsule 3  . nitroGLYCERIN (NITROSTAT) 0.4 MG SL tablet Place 1 tablet  (0.4 mg total) under the tongue every 5 (five) minutes as needed for chest pain. 2 doses before calling for emergent help 25 tablet 3  . polyethylene glycol (MIRALAX / GLYCOLAX) packet Take 17 g by mouth daily. 14 each 0  . Polyethylene Glycol 400 (BLINK TEARS) 0.25 % SOLN Place 1 drop into both eyes 3 (three) times daily.    . sertraline (ZOLOFT) 100 MG tablet TAKE 1 & 1/2 (ONE & ONE-HALF)  TABLETS BY MOUTH ONCE DAILY 135 tablet 0  . sodium bicarbonate 650 MG tablet Take 650 mg by mouth 2 (two) times daily.    . SPS 15 GM/60ML suspension     . tamsulosin (FLOMAX) 0.4 MG CAPS capsule TAKE 1 CAPSULE BY MOUTH ONCE DAILY AFTER SUPPER 90 capsule 3  . triamcinolone cream (KENALOG) 0.1 % APPLY CREAM EXTERNALLY TWICE DAILY FOR 7 TO 10 DAYS MAXMIUM 80 g 0  . vitamin B-12 (CYANOCOBALAMIN) 1000 MCG tablet Take 1,000 mcg by mouth daily.    . furosemide (LASIX) 40 MG tablet Take 1 tablet (40 mg total) by mouth 2 (two) times daily. 60 tablet 6   No current facility-administered medications for this visit.     Review of Systems  Constitutional:  Constitutional negative. HENT: HENT negative.  Eyes: Eyes negative.  Respiratory: Respiratory negative.  Cardiovascular: Cardiovascular negative.  GI: Gastrointestinal negative.  Musculoskeletal: Musculoskeletal negative.  Skin: Skin negative.  Neurological: Neurological negative. Hematologic: Hematologic/lymphatic negative.  Psychiatric: Psychiatric negative.        Objective:      Vitals:   12/02/18 1334  BP: (!) 164/84  Pulse: 77  Resp: 20  Temp: 98.9 F (37.2 C)  SpO2: 95%     Physical Exam HENT:     Head: Normocephalic.     Nose: Nose normal.     Mouth/Throat:     Mouth: Mucous membranes are moist.  Eyes:     Pupils: Pupils are equal, round, and reactive to light.  Neck:     Musculoskeletal: Normal range of motion and neck supple.  Cardiovascular:     Rate and Rhythm: Normal rate.     Pulses:          Radial  pulses are 2+ on the right side and 2+ on the left side.  Abdominal:     General: Abdomen is flat.     Palpations: Abdomen is soft.  Musculoskeletal: Normal range of motion.        General: No swelling.  Skin:    General: Skin is warm and dry.     Capillary Refill: Capillary refill takes less than 2 seconds.  Neurological:     General: No focal deficit present.     Mental Status: He is alert.  Psychiatric:        Mood and Affect: Mood normal.        Behavior: Behavior normal.        Thought Content: Thought content normal.        Judgment: Judgment normal.     Data: I have independently interpreted his vein mapping that demonstrate suitable cephalic and basilic veins in his bilateral upper extremities.  Appears that he could have a cephalic fistula at the wrist on the left.  I have also independently interpreted his bilateral upper extremity arterial duplexes which demonstrate triphasic waveforms throughout.  Brachial artery on the right 0.57 cm left 0.61 cm.     Assessment/Plan:      85 year old male who is right-hand dominant but has left-sided AICD.  Appears to have suitable right-sided vein at least at the antecubitum.  I discussed options for dialysis access including catheter, graft, fistula with fistula being preferable.  plan for right arm avf today in OR.    Waynetta Sandy MD [facility]

## 2019-02-08 ENCOUNTER — Encounter (HOSPITAL_COMMUNITY): Payer: Self-pay | Admitting: Vascular Surgery

## 2019-02-09 ENCOUNTER — Ambulatory Visit (INDEPENDENT_AMBULATORY_CARE_PROVIDER_SITE_OTHER): Payer: PPO | Admitting: Podiatry

## 2019-02-09 ENCOUNTER — Ambulatory Visit (HOSPITAL_COMMUNITY)
Admission: RE | Admit: 2019-02-09 | Discharge: 2019-02-09 | Disposition: A | Discharge status: Home / Self Care | Payer: PPO | Source: Ambulatory Visit | Attending: Nephrology | Admitting: Nephrology

## 2019-02-09 ENCOUNTER — Other Ambulatory Visit: Payer: Self-pay

## 2019-02-09 VITALS — BP 139/68 | HR 71 | Temp 97.9°F | Resp 20

## 2019-02-09 VITALS — Temp 98.5°F

## 2019-02-09 DIAGNOSIS — B351 Tinea unguium: Secondary | ICD-10-CM

## 2019-02-09 DIAGNOSIS — E1122 Type 2 diabetes mellitus with diabetic chronic kidney disease: Secondary | ICD-10-CM | POA: Diagnosis not present

## 2019-02-09 DIAGNOSIS — N184 Chronic kidney disease, stage 4 (severe): Secondary | ICD-10-CM | POA: Insufficient documentation

## 2019-02-09 DIAGNOSIS — S90414A Abrasion, right lesser toe(s), initial encounter: Secondary | ICD-10-CM | POA: Diagnosis not present

## 2019-02-09 DIAGNOSIS — N186 End stage renal disease: Secondary | ICD-10-CM | POA: Diagnosis not present

## 2019-02-09 LAB — POCT HEMOGLOBIN-HEMACUE: Hemoglobin: 11.8 g/dL — ABNORMAL LOW (ref 13.0–17.0)

## 2019-02-09 MED ORDER — EPOETIN ALFA 10000 UNIT/ML IJ SOLN
INTRAMUSCULAR | Status: AC
  Filled 2019-02-09: qty 1

## 2019-02-09 MED ORDER — EPOETIN ALFA 10000 UNIT/ML IJ SOLN
10000.0000 [IU] | INTRAMUSCULAR | Status: DC
  Administered 2019-02-09: 10000 [IU] via SUBCUTANEOUS

## 2019-02-09 MED ORDER — CICLOPIROX 8 % EX SOLN: Freq: Every day | CUTANEOUS | 0 refills | Status: DC

## 2019-02-12 NOTE — Progress Notes (Signed)
Subjective:  Patient ID: Rodney Farley, male    DOB: January 06, 1932,  MRN: [id number]  Chief Complaint  Patient presents with  . Foot Problem    Pt states right 2nd and 3rd digit possibly infected abbrasions. Pt states they were red but have become less red after taking Doxycycline. 2wk duration, no known injury, no drainage.    85 y.o. male presents  for diabetic foot care. Last AMBS was unkown. Denies numbness and tingling in their feet. Denies cramping in legs and thighs.  Review of Systems: Negative except as noted in the HPI. Denies N/V/F/Ch.  Past Medical History:  Diagnosis Date  . AICD (automatic cardioverter/defibrillator) present   . AKI (acute kidney injury) ([place]) 03/27/2017  . Anemia   . Anginal pain ([place])   . Anxiety   . Arm pain 05/08/2015   LEFT ARM  . Arthritis   . Atrioventricular block, complete ([place])    a. 2010 s/p pacemaker.  . Bell's palsy    left side. after shingles episode  . Bilateral renal cysts 07/23/2017   Simple and hemorrhagic noted on CT ab/pelvis   . BPH associated with nocturia   . Cardiomegaly   . Chronic systolic CHF (congestive heart failure) ([place])    EF normalized by Echo 2019  . CKD (chronic kidney disease), stage IV ([place])   . COPD (chronic obstructive pulmonary disease) (HCC)    Severe  . Coronary artery disease    a. s/p MI in 1994/1995 while in [place] s/p questionable PCI. 03/2015: progression of disease, for staged PCI.  [name] Depression   . Diabetic peripheral neuropathy ([place])   . Diarrhea   . Diverticulitis   . DOE (dyspnea on exertion) 03/28/2015  . Full dentures   . Gallstones   . GERD (gastroesophageal reflux disease)   . Gout   . Hard of hearing    B/L  . Heart murmur   . History of chronic pancreatitis 07/23/2017   noted on CT abd/pelvis  . History of shingles   . Hypercholesterolemia   . Hypertension   . Ischemic cardiomyopathy   . MI (myocardial infarction) ([place]) 1994; 1995  . Neuropathy    IN LOWER  EXTREMITIES  . Nosebleed 10/06/2017   for 2 months most recent 10/06/2017  . Obesity   . Pacemaker    medtronic>>> MDT ICD 09/23/15  . Ruptured appendicitis   . Sleep apnea    "sleeps w/humidifyer when he panics and gets short of breath" (04/08/2015)  . TIA (transient ischemic attack) X 3  . Trigger middle finger of left hand   . Type II diabetes mellitus ([place])   . Wears glasses   . Wears hearing aid     Current Outpatient Medications:  .  albuterol (PROVENTIL HFA;VENTOLIN HFA) 108 (90 Base) MCG/ACT inhaler, Inhale 2 puffs into the lungs every 6 (six) hours as needed for wheezing or shortness of breath., Disp: 1 Inhaler, Rfl: 2 .  aspirin EC 81 MG tablet, Take 81 mg by mouth daily., Disp: , Rfl:  .  atorvastatin (LIPITOR) 40 MG tablet, Take 1 tablet (40 mg total) by mouth daily. (Patient taking differently: Take 40 mg by mouth at bedtime. ), Disp: 90 tablet, Rfl: 3 .  calcitRIOL (ROCALTROL) 0.25 MCG capsule, Take 0.25 mcg by mouth daily. , Disp: , Rfl:  .  Carboxymethylcellul-Glycerin (LUBRICATING EYE DROPS OP), Place 1 drop into both eyes 3 (three) times daily as needed (dry eyes)., Disp: , Rfl:  .  carvedilol (COREG)  12.5 MG tablet, Take 12.5 mg by mouth 2 (two) times daily., Disp: , Rfl:  .  doxycycline (VIBRA-TABS) 100 MG tablet, Take 1 tablet (100 mg total) by mouth 2 (two) times daily., Disp: 20 tablet, Rfl: 0 .  Epoetin Alfa (PROCRIT IJ), Inject 1 Dose as directed every Thursday. Through Dr. Justin Mend , Disp: , Rfl:  .  erythromycin ophthalmic ointment, Place 1 application into the left eye at bedtime., Disp: , Rfl:  .  furosemide (LASIX) 40 MG tablet, Take 1 tablet by mouth twice daily, Disp: 60 tablet, Rfl: 5 .  gabapentin (NEURONTIN) 100 MG capsule, TAKE 2 CAPSULES BY MOUTH AT BEDTIME, Disp: 180 capsule, Rfl: 1 .  glucose blood (FREESTYLE TEST STRIPS) test strip, Use to check blood sugar daily, Disp: 100 each, Rfl: 3 .  glucose monitoring kit (FREESTYLE) monitoring kit, USE TO MONITOR  BLOOD GLUCOSE AS DIRECTED, Disp: 1 each, Rfl: 1 .  Insulin Glargine (LANTUS SOLOSTAR) 100 UNIT/ML Solostar Pen, Inject 15 Units into the skin daily. (Patient taking differently: Inject 15 Units into the skin at bedtime. ), Disp: 15 mL, Rfl: 2 .  Insulin Pen Needle (UNIFINE PENTIPS) 31G X 5 MM MISC, USE AS DIRECTED, Disp: 100 each, Rfl: 0 .  isosorbide mononitrate (IMDUR) 60 MG 24 hr tablet, TAKE 1 TABLET BY MOUTH ONCE DAILY (Patient taking differently: Take 60 mg by mouth at bedtime. ** DO NOT CRUSH **), Disp: 90 tablet, Rfl: 2 .  lansoprazole (PREVACID) 15 MG capsule, Take 1 capsule (15 mg total) by mouth daily at 12 noon., Disp: 90 capsule, Rfl: 3 .  nitroGLYCERIN (NITROSTAT) 0.4 MG SL tablet, Place 1 tablet (0.4 mg total) under the tongue every 5 (five) minutes as needed for chest pain. 2 doses before calling for emergent help, Disp: 25 tablet, Rfl: 3 .  oxyCODONE-acetaminophen (PERCOCET) 5-325 MG tablet, Take 1 tablet by mouth every 4 (four) hours as needed for severe pain., Disp: 20 tablet, Rfl: 0 .  oxymetazoline (AFRIN) 0.05 % nasal spray, Place 1 spray into both nostrils 2 (two) times daily as needed for congestion., Disp: , Rfl:  .  sertraline (ZOLOFT) 100 MG tablet, TAKE 1 & 1/2 (ONE & ONE-HALF) TABLETS BY MOUTH ONCE DAILY (Patient taking differently: Take 150 mg by mouth every evening. ), Disp: 135 tablet, Rfl: 0 .  sodium bicarbonate 650 MG tablet, Take 650 mg by mouth 2 (two) times daily., Disp: , Rfl:  .  tamsulosin (FLOMAX) 0.4 MG CAPS capsule, TAKE 1 CAPSULE BY MOUTH ONCE DAILY AFTER SUPPER (Patient taking differently: Take 0.4 mg by mouth daily after supper. ), Disp: 90 capsule, Rfl: 3 .  triamcinolone cream (KENALOG) 0.1 %, APPLY TO CREAM EXTERNALLY AFFECTED AREA TWICE DAILY 7 TO FOR 10 DAYS (Patient taking differently: Apply 1 application topically daily as needed (itching). ), Disp: 80 g, Rfl: 0 .  vitamin B-12 (CYANOCOBALAMIN) 1000 MCG tablet, Take 1,000 mcg by mouth daily., Disp: ,  Rfl:  .  ciclopirox (PENLAC) 8 % solution, Apply topically at bedtime. Apply over nail and surrounding skin. Apply daily over previous coat. Remove weekly with file or polish remover., Disp: 6.6 mL, Rfl: 0  Social History   Tobacco Use  Smoking Status Former Smoker  . Packs/day: 1.50  . Years: 54.00  . Pack years: 81.00  . Types: Cigarettes  . Quit date: 07/18/2007  . Years since quitting: 11.5  Smokeless Tobacco Never Used  Tobacco Comment   did not discuss LdCT but being evaluated for  chole currently    Allergies  Allergen Reactions  . Bee Venom Anaphylaxis  . Lyrica [Pregabalin] Other (See Comments)    hallucinations  . Prednisone Other (See Comments)    hallucinations  . Zocor [Simvastatin] Nausea Only and Other (See Comments)    Headache with brand name only.  Can take the generic.   Objective:   Vitals:   02/09/19 1414  Temp: 98.5 F (36.9 C)   There is no height or weight on file to calculate BMI. Constitutional Well developed. Well nourished.  Vascular Dorsalis pedis pulses present 1+ bilaterally  Posterior tibial pulses present 1+ bilaterally  Pedal hair growth diminished. Capillary refill normal to all digits.  No cyanosis or clubbing noted.  Neurologic Normal speech. Oriented to person, place, and time. Epicritic sensation to light touch grossly present bilaterally. Protective sensation with 5.07 monofilament  present bilaterally.  Dermatologic Nails elongated, thickened, dystrophic. Abrasions to the right second and third toes healing without warmth erythema or signs of infection. No skin lesions.  Orthopedic: Normal joint ROM without pain or crepitus bilaterally. No visible deformities. No bony tenderness.   Assessment:   1. DM type 2 causing ESRD ([place])   2. Onychomycosis   3. Abrasion of toe of right foot, initial encounter    Plan:  Patient was evaluated and treated and all questions answered.  Diabetes with ESRD, Onychomycosis -Educated on  diabetic footcare. Diabetic risk level 1 -Nails x10 debrided sharply and manually with large nail nipper and rotary burr.   Procedure: Nail Debridement Rationale: Patient meets criteria for routine foot care due to ESRD Type of Debridement: manual, sharp debridement. Instrumentation: Nail nipper, rotary burr. Number of Nails: 10   Abrasions right second third toe -Epithelialized without warmth erythema -Continue doxycycline to complete -Follow-up should they worsen  Return in about 3 months (around 05/12/2019) for Diabetic Foot Care.

## 2019-02-14 ENCOUNTER — Other Ambulatory Visit: Payer: Self-pay | Admitting: Physician Assistant

## 2019-02-16 ENCOUNTER — Ambulatory Visit (HOSPITAL_COMMUNITY)
Admission: RE | Admit: 2019-02-16 | Discharge: 2019-02-16 | Disposition: A | Discharge status: Home / Self Care | Payer: PPO | Source: Ambulatory Visit | Attending: Nephrology | Admitting: Nephrology

## 2019-02-16 ENCOUNTER — Other Ambulatory Visit: Payer: Self-pay

## 2019-02-16 VITALS — BP 191/80 | HR 88 | Temp 97.2°F | Resp 20

## 2019-02-16 DIAGNOSIS — K3532 Acute appendicitis with perforation and localized peritonitis, without abscess: Secondary | ICD-10-CM | POA: Diagnosis not present

## 2019-02-16 DIAGNOSIS — N179 Acute kidney failure, unspecified: Secondary | ICD-10-CM | POA: Diagnosis not present

## 2019-02-16 DIAGNOSIS — N189 Chronic kidney disease, unspecified: Secondary | ICD-10-CM | POA: Diagnosis not present

## 2019-02-16 DIAGNOSIS — N184 Chronic kidney disease, stage 4 (severe): Secondary | ICD-10-CM | POA: Insufficient documentation

## 2019-02-16 DIAGNOSIS — I129 Hypertensive chronic kidney disease with stage 1 through stage 4 chronic kidney disease, or unspecified chronic kidney disease: Secondary | ICD-10-CM | POA: Diagnosis not present

## 2019-02-16 DIAGNOSIS — K8020 Calculus of gallbladder without cholecystitis without obstruction: Secondary | ICD-10-CM | POA: Diagnosis not present

## 2019-02-16 DIAGNOSIS — R600 Localized edema: Secondary | ICD-10-CM | POA: Diagnosis not present

## 2019-02-16 DIAGNOSIS — D631 Anemia in chronic kidney disease: Secondary | ICD-10-CM | POA: Diagnosis not present

## 2019-02-16 DIAGNOSIS — N2581 Secondary hyperparathyroidism of renal origin: Secondary | ICD-10-CM | POA: Diagnosis not present

## 2019-02-16 DIAGNOSIS — E875 Hyperkalemia: Secondary | ICD-10-CM | POA: Diagnosis not present

## 2019-02-16 LAB — IRON AND TIBC
Iron: 35 ug/dL — ABNORMAL LOW (ref 45–182)
Saturation Ratios: 16 % — ABNORMAL LOW (ref 17.9–39.5)
TIBC: 224 ug/dL — ABNORMAL LOW (ref 250–450)
UIBC: 189 ug/dL

## 2019-02-16 LAB — POCT HEMOGLOBIN-HEMACUE: Hemoglobin: 12.6 g/dL — ABNORMAL LOW (ref 13.0–17.0)

## 2019-02-16 LAB — FERRITIN: Ferritin: 106 ng/mL (ref 24–336)

## 2019-02-16 MED ORDER — EPOETIN ALFA 10000 UNIT/ML IJ SOLN: 10000.0000 [IU] | INTRAMUSCULAR | Status: DC

## 2019-02-23 ENCOUNTER — Encounter (HOSPITAL_COMMUNITY): Payer: PPO

## 2019-03-02 ENCOUNTER — Other Ambulatory Visit: Payer: Self-pay

## 2019-03-02 ENCOUNTER — Encounter (HOSPITAL_COMMUNITY)
Admission: RE | Admit: 2019-03-02 | Discharge: 2019-03-02 | Disposition: A | Discharge status: Home / Self Care | Payer: PPO | Source: Ambulatory Visit | Attending: Nephrology | Admitting: Nephrology

## 2019-03-02 VITALS — BP 136/51 | HR 73 | Temp 97.2°F | Resp 20

## 2019-03-02 DIAGNOSIS — N184 Chronic kidney disease, stage 4 (severe): Secondary | ICD-10-CM | POA: Insufficient documentation

## 2019-03-02 LAB — POCT HEMOGLOBIN-HEMACUE: Hemoglobin: 11.5 g/dL — ABNORMAL LOW (ref 13.0–17.0)

## 2019-03-02 MED ORDER — EPOETIN ALFA 10000 UNIT/ML IJ SOLN
10000.0000 [IU] | INTRAMUSCULAR | Status: DC
  Administered 2019-03-02: 10000 [IU] via SUBCUTANEOUS

## 2019-03-02 MED ORDER — EPOETIN ALFA 10000 UNIT/ML IJ SOLN
INTRAMUSCULAR | Status: AC
  Filled 2019-03-02: qty 1

## 2019-03-03 ENCOUNTER — Other Ambulatory Visit: Payer: Self-pay | Admitting: Family Medicine

## 2019-03-03 DIAGNOSIS — F32A Depression, unspecified: Secondary | ICD-10-CM

## 2019-03-03 DIAGNOSIS — F329 Major depressive disorder, single episode, unspecified: Secondary | ICD-10-CM

## 2019-03-09 ENCOUNTER — Ambulatory Visit (HOSPITAL_COMMUNITY)
Admission: RE | Admit: 2019-03-09 | Discharge: 2019-03-09 | Disposition: A | Discharge status: Home / Self Care | Payer: PPO | Source: Ambulatory Visit | Attending: Nephrology | Admitting: Nephrology

## 2019-03-09 ENCOUNTER — Other Ambulatory Visit: Payer: Self-pay

## 2019-03-09 VITALS — BP 146/67 | HR 83 | Temp 97.3°F | Resp 20 | Ht 68.0 in | Wt 205.0 lb

## 2019-03-09 DIAGNOSIS — N184 Chronic kidney disease, stage 4 (severe): Secondary | ICD-10-CM | POA: Diagnosis not present

## 2019-03-09 LAB — POCT HEMOGLOBIN-HEMACUE: Hemoglobin: 10.8 g/dL — ABNORMAL LOW (ref 13.0–17.0)

## 2019-03-09 MED ORDER — EPOETIN ALFA 10000 UNIT/ML IJ SOLN
10000.0000 [IU] | INTRAMUSCULAR | Status: DC
  Administered 2019-03-09: 10000 [IU] via SUBCUTANEOUS

## 2019-03-09 MED ORDER — EPOETIN ALFA 10000 UNIT/ML IJ SOLN
INTRAMUSCULAR | Status: AC
  Filled 2019-03-09: qty 1

## 2019-03-15 DIAGNOSIS — I129 Hypertensive chronic kidney disease with stage 1 through stage 4 chronic kidney disease, or unspecified chronic kidney disease: Secondary | ICD-10-CM | POA: Diagnosis not present

## 2019-03-15 DIAGNOSIS — N179 Acute kidney failure, unspecified: Secondary | ICD-10-CM | POA: Diagnosis not present

## 2019-03-15 DIAGNOSIS — D631 Anemia in chronic kidney disease: Secondary | ICD-10-CM | POA: Diagnosis not present

## 2019-03-15 DIAGNOSIS — N184 Chronic kidney disease, stage 4 (severe): Secondary | ICD-10-CM | POA: Diagnosis not present

## 2019-03-15 DIAGNOSIS — N2581 Secondary hyperparathyroidism of renal origin: Secondary | ICD-10-CM | POA: Diagnosis not present

## 2019-03-15 DIAGNOSIS — R600 Localized edema: Secondary | ICD-10-CM | POA: Diagnosis not present

## 2019-03-15 DIAGNOSIS — E875 Hyperkalemia: Secondary | ICD-10-CM | POA: Diagnosis not present

## 2019-03-16 ENCOUNTER — Other Ambulatory Visit: Payer: Self-pay

## 2019-03-16 ENCOUNTER — Encounter (HOSPITAL_COMMUNITY): Payer: PPO

## 2019-03-16 ENCOUNTER — Ambulatory Visit (HOSPITAL_COMMUNITY)
Admission: RE | Admit: 2019-03-16 | Discharge: 2019-03-16 | Disposition: A | Discharge status: Home / Self Care | Payer: PPO | Source: Ambulatory Visit | Attending: Nephrology | Admitting: Nephrology

## 2019-03-16 VITALS — BP 145/46 | HR 72 | Temp 97.4°F | Resp 20

## 2019-03-16 DIAGNOSIS — N184 Chronic kidney disease, stage 4 (severe): Secondary | ICD-10-CM | POA: Insufficient documentation

## 2019-03-16 LAB — IRON AND TIBC
Iron: 23 ug/dL — ABNORMAL LOW (ref 45–182)
Saturation Ratios: 10 % — ABNORMAL LOW (ref 17.9–39.5)
TIBC: 225 ug/dL — ABNORMAL LOW (ref 250–450)
UIBC: 202 ug/dL

## 2019-03-16 LAB — POCT HEMOGLOBIN-HEMACUE: Hemoglobin: 11.6 g/dL — ABNORMAL LOW (ref 13.0–17.0)

## 2019-03-16 LAB — FERRITIN: Ferritin: 116 ng/mL (ref 24–336)

## 2019-03-16 MED ORDER — EPOETIN ALFA 10000 UNIT/ML IJ SOLN
INTRAMUSCULAR | Status: AC
  Filled 2019-03-16: qty 1

## 2019-03-16 MED ORDER — EPOETIN ALFA 10000 UNIT/ML IJ SOLN
10000.0000 [IU] | INTRAMUSCULAR | Status: DC
  Administered 2019-03-16: 09:00:00 10000 [IU] via SUBCUTANEOUS

## 2019-03-21 ENCOUNTER — Ambulatory Visit (INDEPENDENT_AMBULATORY_CARE_PROVIDER_SITE_OTHER): Payer: PPO | Admitting: *Deleted

## 2019-03-21 DIAGNOSIS — I255 Ischemic cardiomyopathy: Secondary | ICD-10-CM

## 2019-03-21 LAB — CUP PACEART REMOTE DEVICE CHECK
Battery Remaining Longevity: 17 mo
Battery Voltage: 2.92 V
Brady Statistic AP VP Percent: 91.29 %
Brady Statistic AP VS Percent: 2.27 %
Brady Statistic AS VP Percent: 5.55 %
Brady Statistic AS VS Percent: 0.89 %
Brady Statistic RA Percent Paced: 91.93 %
Brady Statistic RV Percent Paced: 95.35 %
Date Time Interrogation Session: 20200908052203
HighPow Impedance: 62 Ohm
Implantable Lead Implant Date: 20100112
Implantable Lead Implant Date: 20170313
Implantable Lead Implant Date: 20170313
Implantable Lead Location: 753858
Implantable Lead Location: 753859
Implantable Lead Location: 753860
Implantable Lead Model: 4598
Implantable Lead Model: 5076
Implantable Pulse Generator Implant Date: 20170313
Lead Channel Impedance Value: 342 Ohm
Lead Channel Impedance Value: 361 Ohm
Lead Channel Impedance Value: 361 Ohm
Lead Channel Impedance Value: 399 Ohm
Lead Channel Impedance Value: 418 Ohm
Lead Channel Impedance Value: 418 Ohm
Lead Channel Impedance Value: 418 Ohm
Lead Channel Impedance Value: 513 Ohm
Lead Channel Impedance Value: 608 Ohm
Lead Channel Impedance Value: 646 Ohm
Lead Channel Impedance Value: 665 Ohm
Lead Channel Impedance Value: 665 Ohm
Lead Channel Impedance Value: 703 Ohm
Lead Channel Pacing Threshold Amplitude: 0.625 V
Lead Channel Pacing Threshold Amplitude: 1.375 V
Lead Channel Pacing Threshold Amplitude: 1.375 V
Lead Channel Pacing Threshold Pulse Width: 0.4 ms
Lead Channel Pacing Threshold Pulse Width: 0.4 ms
Lead Channel Pacing Threshold Pulse Width: 0.4 ms
Lead Channel Sensing Intrinsic Amplitude: 0.75 mV
Lead Channel Sensing Intrinsic Amplitude: 0.75 mV
Lead Channel Sensing Intrinsic Amplitude: 31.625 mV
Lead Channel Sensing Intrinsic Amplitude: 31.625 mV
Lead Channel Setting Pacing Amplitude: 2.5 V
Lead Channel Setting Pacing Amplitude: 3 V
Lead Channel Setting Pacing Amplitude: 3.25 V
Lead Channel Setting Pacing Pulse Width: 0.4 ms
Lead Channel Setting Pacing Pulse Width: 0.4 ms
Lead Channel Setting Sensing Sensitivity: 0.3 mV

## 2019-03-22 ENCOUNTER — Other Ambulatory Visit: Payer: Self-pay

## 2019-03-22 DIAGNOSIS — N184 Chronic kidney disease, stage 4 (severe): Secondary | ICD-10-CM

## 2019-03-23 ENCOUNTER — Ambulatory Visit (HOSPITAL_COMMUNITY)
Admission: RE | Admit: 2019-03-23 | Discharge: 2019-03-23 | Disposition: A | Discharge status: Home / Self Care | Payer: PPO | Source: Ambulatory Visit | Attending: Nephrology | Admitting: Nephrology

## 2019-03-23 ENCOUNTER — Other Ambulatory Visit: Payer: Self-pay

## 2019-03-23 VITALS — BP 146/75 | HR 83 | Temp 97.5°F | Resp 20

## 2019-03-23 DIAGNOSIS — N184 Chronic kidney disease, stage 4 (severe): Secondary | ICD-10-CM | POA: Diagnosis not present

## 2019-03-23 MED ORDER — EPOETIN ALFA 10000 UNIT/ML IJ SOLN: 10000.0000 [IU] | INTRAMUSCULAR | Status: DC

## 2019-03-24 ENCOUNTER — Other Ambulatory Visit: Payer: Self-pay

## 2019-03-24 ENCOUNTER — Ambulatory Visit (INDEPENDENT_AMBULATORY_CARE_PROVIDER_SITE_OTHER)
Admission: RE | Admit: 2019-03-24 | Discharge: 2019-03-24 | Disposition: A | Discharge status: Home / Self Care | Payer: PPO | Source: Ambulatory Visit | Attending: Vascular Surgery | Admitting: Vascular Surgery

## 2019-03-24 ENCOUNTER — Ambulatory Visit (INDEPENDENT_AMBULATORY_CARE_PROVIDER_SITE_OTHER): Payer: PPO | Admitting: Physician Assistant

## 2019-03-24 VITALS — BP 121/61 | HR 72 | Temp 98.5°F | Resp 20 | Ht 68.0 in | Wt 202.0 lb

## 2019-03-24 DIAGNOSIS — N184 Chronic kidney disease, stage 4 (severe): Secondary | ICD-10-CM | POA: Diagnosis not present

## 2019-03-24 LAB — POCT HEMOGLOBIN-HEMACUE: Hemoglobin: 12 g/dL — ABNORMAL LOW (ref 13.0–17.0)

## 2019-03-24 NOTE — Progress Notes (Signed)
POST OPERATIVE OFFICE NOTE    CC:  F/u for surgery  HPI:  This is a 85 y.o. male who is s/p Right arm brachiocephalic AV fistula creation.  He denise loss of sensation and  Motor.   He is not on HD at this time.    Allergies  Allergen Reactions  . Bee Venom Anaphylaxis  . Lyrica [Pregabalin] Other (See Comments)    hallucinations  . Prednisone Other (See Comments)    hallucinations  . Zocor [Simvastatin] Nausea Only and Other (See Comments)    Headache with brand name only.  Can take the generic.    Current Outpatient Medications  Medication Sig Dispense Refill  . albuterol (PROVENTIL HFA;VENTOLIN HFA) 108 (90 Base) MCG/ACT inhaler Inhale 2 puffs into the lungs every 6 (six) hours as needed for wheezing or shortness of breath. 1 Inhaler 2  . aspirin EC 81 MG tablet Take 81 mg by mouth daily.    [name] atorvastatin (LIPITOR) 40 MG tablet Take 1 tablet (40 mg total) by mouth daily. (Patient taking differently: Take 40 mg by mouth at bedtime. ) 90 tablet 3  . calcitRIOL (ROCALTROL) 0.25 MCG capsule Take 0.25 mcg by mouth daily.     . Carboxymethylcellul-Glycerin (LUBRICATING EYE DROPS OP) Place 1 drop into both eyes 3 (three) times daily as needed (dry eyes).    . carvedilol (COREG) 12.5 MG tablet Take 12.5 mg by mouth 2 (two) times daily.    . ciclopirox (PENLAC) 8 % solution Apply topically at bedtime. Apply over nail and surrounding skin. Apply daily over previous coat. Remove weekly with file or polish remover. 6.6 mL 0  . doxycycline (VIBRA-TABS) 100 MG tablet Take 1 tablet (100 mg total) by mouth 2 (two) times daily. 20 tablet 0  . Epoetin Alfa (PROCRIT IJ) Inject 1 Dose as directed every Thursday. Through Dr. Justin Mend     . erythromycin ophthalmic ointment Place 1 application into the left eye at bedtime.    . furosemide (LASIX) 40 MG tablet Take 1 tablet by mouth twice daily (Patient taking differently: 80 mg. ) 60 tablet 5  . gabapentin (NEURONTIN) 100 MG capsule TAKE 2 CAPSULES BY  MOUTH AT BEDTIME 180 capsule 1  . glucose blood (FREESTYLE TEST STRIPS) test strip Use to check blood sugar daily 100 each 3  . glucose monitoring kit (FREESTYLE) monitoring kit USE TO MONITOR BLOOD GLUCOSE AS DIRECTED 1 each 1  . Insulin Glargine (LANTUS SOLOSTAR) 100 UNIT/ML Solostar Pen Inject 15 Units into the skin daily. (Patient taking differently: Inject 15 Units into the skin at bedtime. ) 15 mL 2  . Insulin Pen Needle (UNIFINE PENTIPS) 31G X 5 MM MISC USE AS DIRECTED 100 each 0  . isosorbide mononitrate (IMDUR) 60 MG 24 hr tablet TAKE 1 TABLET BY MOUTH ONCE DAILY (Patient taking differently: Take 60 mg by mouth at bedtime. ** DO NOT CRUSH **) 90 tablet 2  . lansoprazole (PREVACID) 15 MG capsule Take 1 capsule (15 mg total) by mouth daily at 12 noon. 90 capsule 3  . nitroGLYCERIN (NITROSTAT) 0.4 MG SL tablet Place 1 tablet (0.4 mg total) under the tongue every 5 (five) minutes as needed for chest pain. 2 doses before calling for emergent help 25 tablet 3  . oxyCODONE-acetaminophen (PERCOCET) 5-325 MG tablet Take 1 tablet by mouth every 4 (four) hours as needed for severe pain. 20 tablet 0  . oxymetazoline (AFRIN) 0.05 % nasal spray Place 1 spray into both nostrils 2 (two) times  daily as needed for congestion.    . sertraline (ZOLOFT) 100 MG tablet TAKE 1 & 1/2 (ONE & ONE-HALF) TABLETS BY MOUTH ONCE DAILY 135 tablet 1  . sodium bicarbonate 650 MG tablet Take 650 mg by mouth 2 (two) times daily.    . tamsulosin (FLOMAX) 0.4 MG CAPS capsule TAKE 1 CAPSULE BY MOUTH ONCE DAILY AFTER SUPPER (Patient taking differently: Take 0.4 mg by mouth daily after supper. ) 90 capsule 3  . triamcinolone cream (KENALOG) 0.1 % APPLY TO CREAM EXTERNALLY AFFECTED AREA TWICE DAILY 7 TO FOR 10 DAYS (Patient taking differently: Apply 1 application topically daily as needed (itching). ) 80 g 0  . vitamin B-12 (CYANOCOBALAMIN) 1000 MCG tablet Take 1,000 mcg by mouth daily.     No current facility-administered  medications for this visit.      ROS:  See HPI  Physical Exam:    Incision:  Well healed Extremities:  Palpable thrill in fistula, palpable radial artery, grip 5/5, and sensation intact.   +-------------+----------+------------+----------+-----------------------------+ OUTFLOW VEIN PSV (cm/s)  Diameter  Depth (cm)          Describe                                       (cm)                                            +-------------+----------+------------+----------+-----------------------------+ Shoulder        204        0.68       1.40       competing branch 0.20     +-------------+----------+------------+----------+-----------------------------+ Prox UA         169        0.68       1.37                                 +-------------+----------+------------+----------+-----------------------------+ Mid to Dst UA   199        0.71       0.36     competing branch 0.46 221                                                            cm/s              +-------------+----------+------------+----------+-----------------------------+ Dist UA         125        0.91       0.36                                 +-------------+----------+------------+----------+-----------------------------+ AC Fossa        240        0.77       0.95                                 +-------------+----------+------------+----------+-----------------------------+   Assessment/Plan:  This is a 85 y.o. male who is s/p: Right BC AV fistula creation  The fistula is easily palpable and maturing well.  The fistula may be used in 6 weeks 05/10/2019 if neccessary.  He will f/u as needed.   Roxy Horseman PA-C [facility] [phone]  Clinic MD:  Donzetta Matters

## 2019-03-30 ENCOUNTER — Encounter (HOSPITAL_COMMUNITY): Payer: PPO

## 2019-04-01 ENCOUNTER — Emergency Department (HOSPITAL_COMMUNITY): Payer: PPO

## 2019-04-01 ENCOUNTER — Encounter (HOSPITAL_COMMUNITY): Payer: Self-pay

## 2019-04-01 ENCOUNTER — Inpatient Hospital Stay (HOSPITAL_COMMUNITY)
Admission: EM | Admit: 2019-04-01 | Discharge: 2019-04-09 | DRG: 291 | Disposition: A | Discharge status: Home Health | Payer: PPO | Attending: Internal Medicine | Admitting: Internal Medicine

## 2019-04-01 ENCOUNTER — Other Ambulatory Visit (HOSPITAL_COMMUNITY): Payer: Self-pay

## 2019-04-01 ENCOUNTER — Other Ambulatory Visit: Payer: Self-pay

## 2019-04-01 DIAGNOSIS — E669 Obesity, unspecified: Secondary | ICD-10-CM | POA: Diagnosis not present

## 2019-04-01 DIAGNOSIS — I132 Hypertensive heart and chronic kidney disease with heart failure and with stage 5 chronic kidney disease, or end stage renal disease: Secondary | ICD-10-CM | POA: Diagnosis not present

## 2019-04-01 DIAGNOSIS — N186 End stage renal disease: Secondary | ICD-10-CM

## 2019-04-01 DIAGNOSIS — E875 Hyperkalemia: Secondary | ICD-10-CM | POA: Diagnosis not present

## 2019-04-01 DIAGNOSIS — K219 Gastro-esophageal reflux disease without esophagitis: Secondary | ICD-10-CM | POA: Diagnosis present

## 2019-04-01 DIAGNOSIS — E1159 Type 2 diabetes mellitus with other circulatory complications: Secondary | ICD-10-CM | POA: Diagnosis present

## 2019-04-01 DIAGNOSIS — D638 Anemia in other chronic diseases classified elsewhere: Secondary | ICD-10-CM | POA: Diagnosis present

## 2019-04-01 DIAGNOSIS — I255 Ischemic cardiomyopathy: Secondary | ICD-10-CM | POA: Diagnosis not present

## 2019-04-01 DIAGNOSIS — Z794 Long term (current) use of insulin: Secondary | ICD-10-CM

## 2019-04-01 DIAGNOSIS — W19XXXA Unspecified fall, initial encounter: Secondary | ICD-10-CM | POA: Diagnosis not present

## 2019-04-01 DIAGNOSIS — Z95810 Presence of automatic (implantable) cardiac defibrillator: Secondary | ICD-10-CM

## 2019-04-01 DIAGNOSIS — Z7982 Long term (current) use of aspirin: Secondary | ICD-10-CM | POA: Diagnosis not present

## 2019-04-01 DIAGNOSIS — Z23 Encounter for immunization: Secondary | ICD-10-CM | POA: Diagnosis not present

## 2019-04-01 DIAGNOSIS — J90 Pleural effusion, not elsewhere classified: Secondary | ICD-10-CM | POA: Diagnosis not present

## 2019-04-01 DIAGNOSIS — D696 Thrombocytopenia, unspecified: Secondary | ICD-10-CM | POA: Diagnosis present

## 2019-04-01 DIAGNOSIS — E785 Hyperlipidemia, unspecified: Secondary | ICD-10-CM | POA: Diagnosis not present

## 2019-04-01 DIAGNOSIS — N184 Chronic kidney disease, stage 4 (severe): Secondary | ICD-10-CM | POA: Diagnosis not present

## 2019-04-01 DIAGNOSIS — R059 Cough, unspecified: Secondary | ICD-10-CM

## 2019-04-01 DIAGNOSIS — E7800 Pure hypercholesterolemia, unspecified: Secondary | ICD-10-CM | POA: Diagnosis present

## 2019-04-01 DIAGNOSIS — Z992 Dependence on renal dialysis: Secondary | ICD-10-CM | POA: Diagnosis not present

## 2019-04-01 DIAGNOSIS — F331 Major depressive disorder, recurrent, moderate: Secondary | ICD-10-CM | POA: Diagnosis present

## 2019-04-01 DIAGNOSIS — Z8673 Personal history of transient ischemic attack (TIA), and cerebral infarction without residual deficits: Secondary | ICD-10-CM

## 2019-04-01 DIAGNOSIS — N401 Enlarged prostate with lower urinary tract symptoms: Secondary | ICD-10-CM | POA: Diagnosis present

## 2019-04-01 DIAGNOSIS — E1142 Type 2 diabetes mellitus with diabetic polyneuropathy: Secondary | ICD-10-CM | POA: Diagnosis present

## 2019-04-01 DIAGNOSIS — I5022 Chronic systolic (congestive) heart failure: Secondary | ICD-10-CM | POA: Diagnosis present

## 2019-04-01 DIAGNOSIS — E1169 Type 2 diabetes mellitus with other specified complication: Secondary | ICD-10-CM | POA: Diagnosis present

## 2019-04-01 DIAGNOSIS — Z20828 Contact with and (suspected) exposure to other viral communicable diseases: Secondary | ICD-10-CM | POA: Diagnosis not present

## 2019-04-01 DIAGNOSIS — R531 Weakness: Secondary | ICD-10-CM | POA: Diagnosis not present

## 2019-04-01 DIAGNOSIS — J449 Chronic obstructive pulmonary disease, unspecified: Secondary | ICD-10-CM | POA: Diagnosis present

## 2019-04-01 DIAGNOSIS — N19 Unspecified kidney failure: Secondary | ICD-10-CM | POA: Diagnosis not present

## 2019-04-01 DIAGNOSIS — E119 Type 2 diabetes mellitus without complications: Secondary | ICD-10-CM | POA: Diagnosis present

## 2019-04-01 DIAGNOSIS — E1122 Type 2 diabetes mellitus with diabetic chronic kidney disease: Secondary | ICD-10-CM | POA: Diagnosis not present

## 2019-04-01 DIAGNOSIS — I4510 Unspecified right bundle-branch block: Secondary | ICD-10-CM | POA: Diagnosis not present

## 2019-04-01 DIAGNOSIS — I2510 Atherosclerotic heart disease of native coronary artery without angina pectoris: Secondary | ICD-10-CM | POA: Diagnosis not present

## 2019-04-01 DIAGNOSIS — I129 Hypertensive chronic kidney disease with stage 1 through stage 4 chronic kidney disease, or unspecified chronic kidney disease: Secondary | ICD-10-CM | POA: Diagnosis not present

## 2019-04-01 DIAGNOSIS — Z87891 Personal history of nicotine dependence: Secondary | ICD-10-CM

## 2019-04-01 DIAGNOSIS — Z79899 Other long term (current) drug therapy: Secondary | ICD-10-CM

## 2019-04-01 DIAGNOSIS — D72829 Elevated white blood cell count, unspecified: Secondary | ICD-10-CM

## 2019-04-01 DIAGNOSIS — I10 Essential (primary) hypertension: Secondary | ICD-10-CM | POA: Diagnosis present

## 2019-04-01 DIAGNOSIS — N390 Urinary tract infection, site not specified: Secondary | ICD-10-CM | POA: Diagnosis present

## 2019-04-01 DIAGNOSIS — R05 Cough: Secondary | ICD-10-CM

## 2019-04-01 DIAGNOSIS — E872 Acidosis: Secondary | ICD-10-CM | POA: Diagnosis present

## 2019-04-01 DIAGNOSIS — I252 Old myocardial infarction: Secondary | ICD-10-CM | POA: Diagnosis not present

## 2019-04-01 DIAGNOSIS — E11649 Type 2 diabetes mellitus with hypoglycemia without coma: Secondary | ICD-10-CM | POA: Diagnosis not present

## 2019-04-01 DIAGNOSIS — Z950 Presence of cardiac pacemaker: Secondary | ICD-10-CM | POA: Diagnosis present

## 2019-04-01 DIAGNOSIS — N250 Renal osteodystrophy: Secondary | ICD-10-CM | POA: Diagnosis present

## 2019-04-01 DIAGNOSIS — F3342 Major depressive disorder, recurrent, in full remission: Secondary | ICD-10-CM | POA: Diagnosis present

## 2019-04-01 DIAGNOSIS — S0990XA Unspecified injury of head, initial encounter: Secondary | ICD-10-CM | POA: Diagnosis not present

## 2019-04-01 DIAGNOSIS — Z03818 Encounter for observation for suspected exposure to other biological agents ruled out: Secondary | ICD-10-CM | POA: Diagnosis not present

## 2019-04-01 DIAGNOSIS — E1129 Type 2 diabetes mellitus with other diabetic kidney complication: Secondary | ICD-10-CM | POA: Diagnosis not present

## 2019-04-01 DIAGNOSIS — B965 Pseudomonas (aeruginosa) (mallei) (pseudomallei) as the cause of diseases classified elsewhere: Secondary | ICD-10-CM | POA: Diagnosis present

## 2019-04-01 DIAGNOSIS — Z4901 Encounter for fitting and adjustment of extracorporeal dialysis catheter: Secondary | ICD-10-CM | POA: Diagnosis not present

## 2019-04-01 DIAGNOSIS — Z955 Presence of coronary angioplasty implant and graft: Secondary | ICD-10-CM

## 2019-04-01 DIAGNOSIS — H9193 Unspecified hearing loss, bilateral: Secondary | ICD-10-CM | POA: Diagnosis present

## 2019-04-01 DIAGNOSIS — R400 Somnolence: Secondary | ICD-10-CM | POA: Diagnosis not present

## 2019-04-01 LAB — CBC WITH DIFFERENTIAL/PLATELET
Abs Immature Granulocytes: 0.04 10*3/uL (ref 0.00–0.07)
Basophils Absolute: 0 10*3/uL (ref 0.0–0.1)
Basophils Relative: 1 %
Eosinophils Absolute: 0.6 10*3/uL — ABNORMAL HIGH (ref 0.0–0.5)
Eosinophils Relative: 7 %
HCT: 37.9 % — ABNORMAL LOW (ref 39.0–52.0)
Hemoglobin: 11.7 g/dL — ABNORMAL LOW (ref 13.0–17.0)
Immature Granulocytes: 1 %
Lymphocytes Relative: 15 %
Lymphs Abs: 1.3 10*3/uL (ref 0.7–4.0)
MCH: 30.4 pg (ref 26.0–34.0)
MCHC: 30.9 g/dL (ref 30.0–36.0)
MCV: 98.4 fL (ref 80.0–100.0)
Monocytes Absolute: 0.7 10*3/uL (ref 0.1–1.0)
Monocytes Relative: 8 %
Neutro Abs: 6 10*3/uL (ref 1.7–7.7)
Neutrophils Relative %: 68 %
Platelets: 173 10*3/uL (ref 150–400)
RBC: 3.85 MIL/uL — ABNORMAL LOW (ref 4.22–5.81)
RDW: 15.8 % — ABNORMAL HIGH (ref 11.5–15.5)
WBC: 8.6 10*3/uL (ref 4.0–10.5)
nRBC: 0 % (ref 0.0–0.2)

## 2019-04-01 LAB — BASIC METABOLIC PANEL
Anion gap: 13 (ref 5–15)
BUN: 100 mg/dL — ABNORMAL HIGH (ref 8–23)
CO2: 12 mmol/L — ABNORMAL LOW (ref 22–32)
Calcium: 7.6 mg/dL — ABNORMAL LOW (ref 8.9–10.3)
Chloride: 116 mmol/L — ABNORMAL HIGH (ref 98–111)
Creatinine, Ser: 7.7 mg/dL — ABNORMAL HIGH (ref 0.61–1.24)
GFR calc Af Amer: 7 mL/min — ABNORMAL LOW (ref >60)
GFR calc non Af Amer: 6 mL/min — ABNORMAL LOW (ref >60)
Glucose, Bld: 100 mg/dL — ABNORMAL HIGH (ref 70–99)
Potassium: 6 mmol/L — ABNORMAL HIGH (ref 3.5–5.1)
Sodium: 141 mmol/L (ref 135–145)

## 2019-04-01 MED ORDER — SODIUM ZIRCONIUM CYCLOSILICATE 10 G PO PACK
10.0000 g | PACK | Freq: Once | ORAL | Status: AC
  Administered 2019-04-01: 10 g via ORAL

## 2019-04-01 MED ORDER — TETANUS-DIPHTH-ACELL PERTUSSIS 5-2.5-18.5 LF-MCG/0.5 IM SUSP
0.5000 mL | Freq: Once | INTRAMUSCULAR | Status: AC
  Administered 2019-04-01: 16:00:00 0.5 mL via INTRAMUSCULAR
  Filled 2019-04-01: qty 0.5

## 2019-04-01 MED ORDER — CHLORHEXIDINE GLUCONATE CLOTH 2 % EX PADS
6.0000 | MEDICATED_PAD | Freq: Every day | CUTANEOUS | Status: DC
  Administered 2019-04-03 – 2019-04-09 (×7): 6 via TOPICAL

## 2019-04-01 MED ORDER — ALUM & MAG HYDROXIDE-SIMETH 200-200-20 MG/5ML PO SUSP
15.0000 mL | Freq: Once | ORAL | Status: DC
  Filled 2019-04-01: qty 30

## 2019-04-01 MED ORDER — SODIUM ZIRCONIUM CYCLOSILICATE 10 G PO PACK
10.0000 g | PACK | Freq: Two times a day (BID) | ORAL | Status: DC
  Filled 2019-04-01: qty 1

## 2019-04-01 MED ORDER — ALUM & MAG HYDROXIDE-SIMETH 200-200-20 MG/5ML PO SUSP
30.0000 mL | Freq: Once | ORAL | Status: AC
  Administered 2019-04-01: 30 mL via ORAL

## 2019-04-01 MED ORDER — SODIUM ZIRCONIUM CYCLOSILICATE 10 G PO PACK
10.0000 g | PACK | Freq: Two times a day (BID) | ORAL | Status: DC
  Administered 2019-04-02 – 2019-04-04 (×5): 10 g via ORAL
  Filled 2019-04-01 (×5): qty 1

## 2019-04-01 NOTE — H&P (Signed)
History and Physical   Rodney Farley OIZ:[id number] DOB: 10-10-1931 DOA: 04/01/2019  Referring MD/NP/PA: Margarita Mail, PA  PCP: [name], MD   Outpatient Specialists: Dr. Justin Mend, nephrology  Patient coming from: Home  Chief Complaint: Fall and weakness  HPI: Rodney Farley is a 85 y.o. male with medical history significant of end-stage renal disease who had recent AV fistula placed on 02/07/2019 with plan for initiation of hemodialysis when it matures.  Also history of diabetes, complete heart block status post AICD, anemia of chronic disease, COPD, diverticulosis, BPH who presented to the ER with generalized weakness and fall.  Patient has been feeling off balance lately.  He has been having problem with his balance usually in the morning.  No vertigo or presyncope.  He was reaching for the telephone in bed and rolled out and fell.  He fell between the nightstand in the bed.  He was unable to get up until he was brought to the ER.  He called 911.  Patient said he hit his head when he was falling.  No other pain and no other issues.  While in the ER he was found to have a potassium of 6.0 and worsening renal function.  Nephrology consulted and plan is to go ahead and initiate hemodialysis.  Patient is fully awake and alert and has no new complaints.  No fever or chills..  ED Course: Temperature is 97.8 blood pressure 172/68 pulse 120 respiratory 21 oxygen sat 86% on room air.  Currently 98% on room air.  Sodium is 141 potassium 6.0 chloride 116 CO2 12 glucose 100 BUN 100 creatinine 7.7 calcium 7.6 with a gap of 13.  His GFR is 6.  White count 8.6 hemoglobin 11.7 platelets 173.  Head CT without contrast showed no acute findings.  COVID-19 screen is currently pending.  Patient has been seen by nephrology and will be admitted to the hospital for hemodialysis  Review of Systems: As per HPI otherwise 10 point review of systems negative.    Past Medical History:  Diagnosis Date   . AICD (automatic cardioverter/defibrillator) present   . AKI (acute kidney injury) ([place]) 03/27/2017  . Anemia   . Anginal pain ([place])   . Anxiety   . Arm pain 05/08/2015   LEFT ARM  . Arthritis   . Atrioventricular block, complete ([place])    a. 2010 s/p pacemaker.  . Bell's palsy    left side. after shingles episode  . Bilateral renal cysts 07/23/2017   Simple and hemorrhagic noted on CT ab/pelvis   . BPH associated with nocturia   . Cardiomegaly   . Chronic systolic CHF (congestive heart failure) ([place])    EF normalized by Echo 2019  . CKD (chronic kidney disease), stage IV ([place])   . COPD (chronic obstructive pulmonary disease) ([facility])    Severe  . Coronary artery disease    a. s/p MI in 1994/1995 while in [place] s/p questionable PCI. 03/2015: progression of disease, for staged PCI.  [name] Depression   . Diabetic peripheral neuropathy ([place])   . Diarrhea   . Diverticulitis   . DOE (dyspnea on exertion) 03/28/2015  . Full dentures   . Gallstones   . GERD (gastroesophageal reflux disease)   . Gout   . Hard of hearing    B/L  . Heart murmur   . History of chronic pancreatitis 07/23/2017   noted on CT abd/pelvis  . History of shingles   . Hypercholesterolemia   . Hypertension   .  Ischemic cardiomyopathy   . MI (myocardial infarction) ([place]) 1994; 1995  . Neuropathy    IN LOWER EXTREMITIES  . Nosebleed 10/06/2017   for 2 months most recent 10/06/2017  . Obesity   . Pacemaker    medtronic>>> MDT ICD 09/23/15  . Ruptured appendicitis   . Sleep apnea    "sleeps w/humidifyer when he panics and gets short of breath" (04/08/2015)  . TIA (transient ischemic attack) X 3  . Trigger middle finger of left hand   . Type II diabetes mellitus ([place])   . Wears glasses   . Wears hearing aid     Past Surgical History:  Procedure Laterality Date  . APPENDECTOMY    . AV FISTULA PLACEMENT Right 02/07/2019   Procedure: ARTERIOVENOUS (AV) FISTULA CREATION RIGHT UPPER ARM;  Surgeon: Waynetta Sandy, MD;  Location: [facility];  Service: Vascular;  Laterality: Right;  . CARDIAC CATHETERIZATION N/A 03/29/2015   Procedure: Right/Left Heart Cath and Coronary Angiography;  Surgeon: Edras M [name], MD;  Location: [facility];  Service: Cardiovascular;  Laterality: N/A;  . [place]   "after my MI; put me on heart RX after cath"  . CARDIAC CATHETERIZATION N/A 04/09/2015   Procedure: Coronary Stent Intervention;  Surgeon: Abrar M [name], MD;  Location: [facility];  Service: Cardiovascular;  Laterality: N/A;  . CATARACT EXTRACTION W/ INTRAOCULAR LENS  IMPLANT, BILATERAL    . CHOLECYSTECTOMY N/A 10/13/2017   Procedure: LAPAROSCOPIC CHOLECYSTECTOMY WITH LYSIS OF ADHESIONS;  Surgeon: Ileana Roup, MD;  Location: [facility];  Service: General;  Laterality: N/A;  . COLONOSCOPY    . DENTAL SURGERY    . EP IMPLANTABLE DEVICE N/A 09/23/2015   MDT CRT-D, Dr. Caryl Comes  . HIATAL HERNIA REPAIR  1977  . ILEOCECETOMY N/A 03/27/2017   Procedure: ILEOCECECTOMY;  Surgeon: Ileana Roup, MD;  Location: [facility];  Service: General;  Laterality: N/A;  . INSERT / REPLACE / REMOVE PACEMAKER  07/2008   Complete heart block status post DDD with good function  . LAPAROTOMY N/A 03/27/2017   Procedure: EXPLORATORY LAPAROTOMY;  Surgeon: Ileana Roup, MD;  Location: [facility];  Service: General;  Laterality: N/A;  . TONSILLECTOMY    . UPPER GI ENDOSCOPY       reports that he quit smoking about 11 years ago. His smoking use included cigarettes. He has a 81.00 pack-year smoking history. He has never used smokeless tobacco. He reports current alcohol use. He reports that he does not use drugs.  Allergies  Allergen Reactions  . Bee Venom Anaphylaxis  . Lyrica [Pregabalin] Other (See Comments)    hallucinations  . Prednisone Other (See Comments)    hallucinations  . Zocor [Simvastatin] Nausea Only and Other (See Comments)    Headache with brand name only.  Can take the  generic.    Family History  Problem Relation Age of Onset  . Stroke Mother   . Leukemia Father   . Stroke Sister   . Heart attack Brother      Prior to Admission medications   Medication Sig Start Date End Date Taking? Authorizing Provider  aspirin EC 81 MG tablet Take 81 mg by mouth daily.   Yes [provider]  atorvastatin (LIPITOR) 40 MG tablet Take 1 tablet (40 mg total) by mouth daily. Patient taking differently: Take 40 mg by mouth at bedtime.  06/14/17  Yes [name], MD  calcitRIOL (ROCALTROL) 0.25 MCG capsule Take  0.25 mcg by mouth daily.  04/12/18  Yes [provider]  Carboxymethylcellul-Glycerin (LUBRICATING EYE DROPS OP) Place 1 drop into both eyes 3 (three) times daily as needed (dry eyes).   Yes [provider]  carvedilol (COREG) 12.5 MG tablet Take 12.5 mg by mouth 2 (two) times daily. 10/14/18  Yes [provider]  Epoetin Alfa (PROCRIT IJ) Inject 1 Dose as directed every Thursday. Through Dr. Justin Mend    Yes [provider]  erythromycin ophthalmic ointment Place 1 application into the left eye at bedtime.   Yes [provider]  furosemide (LASIX) 80 MG tablet Take 80 mg by mouth 2 (two) times daily. 02/16/19  Yes [provider]  Insulin Glargine (LANTUS SOLOSTAR) 100 UNIT/ML Solostar Pen Inject 15 Units into the skin daily. Patient taking differently: Inject 15 Units into the skin at bedtime.  08/08/18  Yes [name], MD  isosorbide mononitrate (IMDUR) 60 MG 24 hr tablet TAKE 1 TABLET BY MOUTH ONCE DAILY Patient taking differently: Take 60 mg by mouth at bedtime. ** DO NOT CRUSH ** 07/14/18  Yes [name], Fontaine M, MD  lansoprazole (PREVACID) 15 MG capsule Take 1 capsule (15 mg total) by mouth daily at 12 noon. 09/13/18  Yes [name], MD  nitroGLYCERIN (NITROSTAT) 0.4 MG SL tablet Place 1 tablet (0.4 mg total) under the tongue every 5 (five) minutes as needed for chest pain. 2 doses before calling  for emergent help 06/21/18  Yes [name], MD  oxymetazoline (AFRIN) 0.05 % nasal spray Place 1 spray into both nostrils 2 (two) times daily as needed for congestion.   Yes [provider]  sertraline (ZOLOFT) 100 MG tablet TAKE 1 & 1/2 (ONE & ONE-HALF) TABLETS BY MOUTH ONCE DAILY Patient taking differently: Take 150 mg by mouth daily.  03/06/19  Yes [name], MD  sodium bicarbonate 650 MG tablet Take 650 mg by mouth 2 (two) times daily. 09/25/18  Yes [provider]  vitamin B-12 (CYANOCOBALAMIN) 1000 MCG tablet Take 1,000 mcg by mouth daily.   Yes [provider]  glucose blood (FREESTYLE TEST STRIPS) test strip Use to check blood sugar daily 06/18/17   [name], MD  glucose monitoring kit (FREESTYLE) monitoring kit USE TO MONITOR BLOOD GLUCOSE AS DIRECTED 02/04/17   [name], MD  Insulin Pen Needle (UNIFINE PENTIPS) 31G X 5 MM MISC USE AS DIRECTED 01/28/17   [name], MD    Physical Exam: Vitals:   04/01/19 1930 04/01/19 2030 04/01/19 2045 04/01/19 2100  BP: (!) 158/67 119/84  (!) 109/98  Pulse: 70 79 67 (!) 128  Resp: (!) 21 19 (!) 21 19  Temp:      TempSrc:      SpO2: 100% 90% 98%       Constitutional: Anxious, fully awake, and tremulous Vitals:   04/01/19 1930 04/01/19 2030 04/01/19 2045 04/01/19 2100  BP: (!) 158/67 119/84  (!) 109/98  Pulse: 70 79 67 (!) 128  Resp: (!) 21 19 (!) 21 19  Temp:      TempSrc:      SpO2: 100% 90% 98%    Eyes: PERRL, lids and conjunctivae normal ENMT: Mucous membranes are dry posterior pharynx clear of any exudate or lesions.Normal dentition.  Neck: normal, supple, no masses, no thyromegaly Respiratory: clear to auscultation bilaterally, no wheezing, mild basal crackles. Normal respiratory effort. No accessory muscle use.  Cardiovascular: Regular rate and rhythm, no murmurs /  rubs / gallops. No extremity edema. 2+ pedal pulses. No carotid bruits.  Abdomen: no tenderness, no  masses palpated. No hepatosplenomegaly. Bowel sounds positive.  Musculoskeletal: no clubbing / cyanosis. No joint deformity upper and lower extremities. Good ROM, no contractures. Normal muscle tone.  Skin: Punctate rashes, lesions, ulcers. No induration Neurologic: CN 2-12 grossly intact. Sensation intact, DTR normal. Strength 5/5 in all 4.  Psychiatric: Normal judgment and insight. Alert and oriented x 3. Normal mood.     Labs on Admission: I have personally reviewed following labs and imaging studies  CBC: Recent Labs  Lab 04/01/19 1322  WBC 8.6  NEUTROABS 6.0  HGB 11.7*  HCT 37.9*  MCV 98.4  PLT 097   Basic Metabolic Panel: Recent Labs  Lab 04/01/19 1322  NA 141  K 6.0*  CL 116*  CO2 12*  GLUCOSE 100*  BUN 100*  CREATININE 7.70*  CALCIUM 7.6*   GFR: Estimated Creatinine Clearance: 7.7 mL/min (A) (by C-G formula based on SCr of 7.7 mg/dL (H)). Liver Function Tests: No results for input(s): AST, ALT, ALKPHOS, BILITOT, PROT, ALBUMIN in the last 168 hours. No results for input(s): LIPASE, AMYLASE in the last 168 hours. No results for input(s): AMMONIA in the last 168 hours. Coagulation Profile: No results for input(s): INR, PROTIME in the last 168 hours. Cardiac Enzymes: No results for input(s): CKTOTAL, CKMB, CKMBINDEX, TROPONINI in the last 168 hours. BNP (last 3 results) No results for input(s): PROBNP in the last 8760 hours. HbA1C: No results for input(s): HGBA1C in the last 72 hours. CBG: No results for input(s): GLUCAP in the last 168 hours. Lipid Profile: No results for input(s): CHOL, HDL, LDLCALC, TRIG, CHOLHDL, LDLDIRECT in the last 72 hours. Thyroid Function Tests: No results for input(s): TSH, T4TOTAL, FREET4, T3FREE, THYROIDAB in the last 72 hours. Anemia Panel: No results for input(s): VITAMINB12, FOLATE, FERRITIN, TIBC, IRON, RETICCTPCT in the last 72 hours. Urine analysis:    Component Value Date/Time   COLORURINE YELLOW 03/26/2017 1809    APPEARANCEUR CLEAR 03/26/2017 1809   LABSPEC 1.013 03/26/2017 1809   PHURINE 5.0 03/26/2017 1809   GLUCOSEU NEGATIVE 03/26/2017 1809   GLUCOSEU NEGATIVE 03/25/2017 1309   HGBUR NEGATIVE 03/26/2017 1809   BILIRUBINUR NEGATIVE 03/26/2017 1809   BILIRUBINUR negative 04/04/2015 1229   BILIRUBINUR neg 02/27/2015 1359   KETONESUR NEGATIVE 03/26/2017 1809   PROTEINUR 100 (A) 03/26/2017 1809   UROBILINOGEN 0.2 03/25/2017 1309   NITRITE NEGATIVE 03/26/2017 1809   LEUKOCYTESUR NEGATIVE 03/26/2017 1809   Sepsis Labs: _0 (procalcitonin:4,lacticidven:4) )No results found for this or any previous visit (from the past 240 hour(s)).   Radiological Exams on Admission: Ct Head Wo Contrast  Result Date: 04/01/2019 CLINICAL DATA:  Rolled out of bed. EXAM: CT HEAD WITHOUT CONTRAST TECHNIQUE: Contiguous axial images were obtained from the base of the skull through the vertex without intravenous contrast. COMPARISON:  CT head dated April 03, 2016. FINDINGS: Brain: No evidence of acute infarction, hemorrhage, hydrocephalus, extra-axial collection or mass lesion/mass effect. Stable atrophy and chronic microvascular ischemic changes. Unchanged remote lacunar infarcts in the left basal ganglia and thalamus. Vascular: Calcified atherosclerosis at the skullbase. No hyperdense vessel. Skull: Normal. Negative for fracture or focal lesion. Sinuses/Orbits: No acute finding. Postsurgical changes to both globes. Other: None. IMPRESSION: 1. No acute intracranial abnormality. Stable atrophy and chronic microvascular ischemic changes. Electronically Signed   By: Titus Dubin M.D.   On: 04/01/2019 14:26    EKG: Independently reviewed.  It shows paced rhythm  with a rate of 70.  Right bundle branch block several conduction abnormalities which are old  Assessment/Plan Principal Problem:   ESRD (end stage renal disease) ([place]) Active Problems:   Ischemic cardiomyopathy   Obesity   Pacemaker   Depression, major,  recurrent, moderate ([facility])   Controlled type 2 diabetes mellitus with stage 4 chronic kidney disease ([facility])   HTN (hypertension)   GERD (gastroesophageal reflux disease)   Chronic kidney disease (CKD), stage IV (severe) ([facility])   Diabetic peripheral neuropathy ([facility])   COPD (chronic obstructive pulmonary disease) ([facility])   Hyperlipidemia   Hyperkalemia, diminished renal excretion     #1 end-stage renal disease: Patient will be admitted.  Nephrology planning to initiate hemodialysis.  He is stable.  Will address other electrolyte abnormalities.  #2 hyperkalemia: Potassium is 6.0.  He has received Kayexalate.  Patient will be dialyzed according to nephrology schedule.  #3 diabetes: Sliding scale insulin with home regimen  #4 status post pacemaker: Patient has paced rhythm and stable.  #5 hypertension: Continue home regimen.  Hold diuretics.  #6 history of COPD: No acute exacerbation.  Continue to monitor  #7 GERD: Continue with PPIs   DVT prophylaxis: Heparin Code Status: Full code Family Communication: No family at bedside Disposition Plan: To be determined Consults called: Dr. Jonnie Finner nephrology Admission status: Inpatient  Severity of Illness: The appropriate patient status for this patient is INPATIENT. Inpatient status is judged to be reasonable and necessary in order to provide the required intensity of service to ensure the patient's safety. [name] presenting symptoms, physical exam findings, and initial radiographic and laboratory data in the context of their chronic comorbidities is felt to place them at high risk for further clinical deterioration. Furthermore, it is not anticipated that the patient will be medically stable for discharge from the hospital within 2 midnights of admission. The following factors support the patient status of inpatient.   " [name] presenting symptoms include weakness and fall. " The worrisome physical exam findings include tremulous.  " The initial radiographic and laboratory data are worrisome because of potassium of 6.1 creatinine 7.7. " The chronic co-morbidities include chronic kidney disease stage V.   * I certify that at the point of admission it is my clinical judgment that the patient will require inpatient hospital care spanning beyond 2 midnights from the point of admission due to high intensity of service, high risk for further deterioration and high frequency of surveillance required.Barbette Merino MD [facility] Pager [phone]  If 7PM-7AM, please contact night-coverage [email or web address] Password [email or web address]  04/01/2019, 9:15 PM

## 2019-04-01 NOTE — ED Notes (Signed)
Per son- wife is not allowed to get any info on the pt

## 2019-04-01 NOTE — ED Notes (Signed)
Upon talking with the pt, pt states that he did hit his head when he fell out of the bed, but no loc. Also states that he woke up not feeling well, was a little unsteady on his feet so went back to bed. No neuro deficits.

## 2019-04-01 NOTE — ED Provider Notes (Signed)
[facility] EMERGENCY DEPARTMENT Provider Note   CSN: [id number] Arrival date & time: 04/01/19  1308     History   Chief Complaint Chief Complaint  Patient presents with   Fall    HPI Cloud Graham is a 85 y.o. male who presents emergency department after mechanical fall.  Patient has end-stage renal disease and is Being followed by Dr. Justin Mend.  He had an AV fistula placed on 02/07/2019.  Patient states that when he woke up this morning and got to the bathroom he felt "a bit wobbly."  He states that frequently when he first wakes up he is a little bit off balance or when he changes position and this is not new.  He denies vertigo, presyncope.  The patient states that today he was reaching for the telephone in bed and rolled out of the bed wedging himself between the nightstand and the bed was unable to get up.  Likely he was able to call the fire department who helped him up.  He did hit his head on the way down.  He denies any upper extremity weakness.  His granddaughter who is at bedside states that he is scheduled to begin dialysis in the near future.  He has been more somnolent than usual but is otherwise alert and oriented.     HPI  Past Medical History:  Diagnosis Date   AICD (automatic cardioverter/defibrillator) present    AKI (acute kidney injury) ([place]) 03/27/2017   Anemia    Anginal pain ([place])    Anxiety    Arm pain 05/08/2015   LEFT ARM   Arthritis    Atrioventricular block, complete ([place])    a. 2010 s/p pacemaker.   Bell's palsy    left side. after shingles episode   Bilateral renal cysts 07/23/2017   Simple and hemorrhagic noted on CT ab/pelvis    BPH associated with nocturia    Cardiomegaly    Chronic systolic CHF (congestive heart failure) ([place])    EF normalized by Echo 2019   CKD (chronic kidney disease), stage IV (HCC)    COPD (chronic obstructive pulmonary disease) ([place])    Severe   Coronary artery disease    a.  s/p MI in 1994/1995 while in [place] s/p questionable PCI. 03/2015: progression of disease, for staged PCI.   Depression    Diabetic peripheral neuropathy (HCC)    Diarrhea    Diverticulitis    DOE (dyspnea on exertion) 03/28/2015   Full dentures    Gallstones    GERD (gastroesophageal reflux disease)    Gout    Hard of hearing    B/L   Heart murmur    History of chronic pancreatitis 07/23/2017   noted on CT abd/pelvis   History of shingles    Hypercholesterolemia    Hypertension    Ischemic cardiomyopathy    MI (myocardial infarction) ([place]) 1994; 1995   Neuropathy    IN LOWER EXTREMITIES   Nosebleed 10/06/2017   for 2 months most recent 10/06/2017   Obesity    Pacemaker    medtronic>>> MDT ICD 09/23/15   Ruptured appendicitis    Sleep apnea    "sleeps w/humidifyer when he panics and gets short of breath" (04/08/2015)   TIA (transient ischemic attack) X 3   Trigger middle finger of left hand    Type II diabetes mellitus ([place])    Wears glasses    Wears hearing aid     Patient Active Problem List  Diagnosis Date Noted   Hyperlipidemia 04/13/2018   S/P cholecystectomy 10/13/2017   Diarrhea 05/11/2017   Ruptured appendicitis 03/26/2017   Marital stress 02/18/2017   Trigger finger 06/10/2016   BPH associated with nocturia 04/27/2016   Diabetic peripheral neuropathy (HCC)    COPD (chronic obstructive pulmonary disease) ([place])    Bell's palsy    Gout 56/43/3295   Chronic systolic CHF (congestive heart failure) ([place]) 04/08/2015   Diverticulitis 04/08/2015   DOE (dyspnea on exertion), due to significant CAD 03/28/2015   Chronic kidney disease (CKD), stage IV (severe) ([place]) 03/04/2015   Coronary artery disease involving native coronary artery of native heart with angina pectoris ([place]) 09/13/2014   GERD (gastroesophageal reflux disease) 08/07/2012   Depression, major, recurrent, moderate ([place]) 08/05/2012   Sleep apnea 08/05/2012     Controlled type 2 diabetes mellitus with stage 4 chronic kidney disease ([place]) 08/05/2012   HTN (hypertension) 08/05/2012   Ischemic cardiomyopathy 11/03/2011   Atrioventricular block, complete ([place])    Obesity    Pacemaker     Past Surgical History:  Procedure Laterality Date   APPENDECTOMY     AV FISTULA PLACEMENT Right 02/07/2019   Procedure: ARTERIOVENOUS (AV) FISTULA CREATION RIGHT UPPER ARM;  Surgeon: Waynetta Sandy, MD;  Location: [place];  Service: Vascular;  Laterality: Right;   CARDIAC CATHETERIZATION N/A 03/29/2015   Procedure: Right/Left Heart Cath and Coronary Angiography;  Surgeon: Achilles M [name], MD;  Location: [place] [facility];  Service: Cardiovascular;  Laterality: N/A;   [place]   "after my MI; put me on heart RX after cath"   CARDIAC CATHETERIZATION N/A 04/09/2015   Procedure: Coronary Stent Intervention;  Surgeon: Gyan M [name], MD;  Location: [place] [facility];  Service: Cardiovascular;  Laterality: N/A;   CATARACT EXTRACTION W/ INTRAOCULAR LENS  IMPLANT, BILATERAL     CHOLECYSTECTOMY N/A 10/13/2017   Procedure: LAPAROSCOPIC CHOLECYSTECTOMY WITH LYSIS OF ADHESIONS;  Surgeon: Ileana Roup, MD;  Location: [facility];  Service: General;  Laterality: N/A;   COLONOSCOPY     DENTAL SURGERY     EP IMPLANTABLE DEVICE N/A 09/23/2015   MDT CRT-D, Dr. Caryl Comes   HIATAL HERNIA REPAIR  1977   ILEOCECETOMY N/A 03/27/2017   Procedure: ILEOCECECTOMY;  Surgeon: Ileana Roup, MD;  Location: [place];  Service: General;  Laterality: N/A;   INSERT / REPLACE / REMOVE PACEMAKER  07/2008   Complete heart block status post DDD with good function   LAPAROTOMY N/A 03/27/2017   Procedure: EXPLORATORY LAPAROTOMY;  Surgeon: Ileana Roup, MD;  Location: [place];  Service: General;  Laterality: N/A;   TONSILLECTOMY     UPPER GI ENDOSCOPY          Home Medications    Prior to Admission medications   Medication Sig Start  Date End Date Taking? Authorizing Provider  albuterol (PROVENTIL HFA;VENTOLIN HFA) 108 (90 Base) MCG/ACT inhaler Inhale 2 puffs into the lungs every 6 (six) hours as needed for wheezing or shortness of breath. 08/12/17   [name], MD  aspirin EC 81 MG tablet Take 81 mg by mouth daily.    [provider]  atorvastatin (LIPITOR) 40 MG tablet Take 1 tablet (40 mg total) by mouth daily. Patient taking differently: Take 40 mg by mouth at bedtime.  06/14/17   [name], MD  calcitRIOL (ROCALTROL) 0.25 MCG capsule Take 0.25 mcg by mouth daily.  04/12/18   [provider]  Carboxymethylcellul-Glycerin ([place]  EYE DROPS OP) Place 1 drop into both eyes 3 (three) times daily as needed (dry eyes).    [provider]  carvedilol (COREG) 12.5 MG tablet Take 12.5 mg by mouth 2 (two) times daily. 10/14/18   [provider]  ciclopirox (PENLAC) 8 % solution Apply topically at bedtime. Apply over nail and surrounding skin. Apply daily over previous coat. Remove weekly with file or polish remover. 02/09/19   Evelina Bucy, DPM  Epoetin Alfa (PROCRIT IJ) Inject 1 Dose as directed every Thursday. Through Dr. Justin Mend     [provider]  erythromycin ophthalmic ointment Place 1 application into the left eye at bedtime.    [provider]  furosemide (LASIX) 40 MG tablet Take 1 tablet by mouth twice daily Patient taking differently: 80 mg.  01/11/19   [name], Kerman M, MD  gabapentin (NEURONTIN) 100 MG capsule TAKE 2 CAPSULES BY MOUTH AT BEDTIME 02/02/19   [name], MD  glucose blood (FREESTYLE TEST STRIPS) test strip Use to check blood sugar daily 06/18/17   [name], MD  glucose monitoring kit (FREESTYLE) monitoring kit USE TO MONITOR BLOOD GLUCOSE AS DIRECTED 02/04/17   [name], MD  Insulin Glargine (LANTUS SOLOSTAR) 100 UNIT/ML Solostar Pen Inject 15 Units into the skin daily. Patient taking differently: Inject 15 Units into  the skin at bedtime.  08/08/18   [name], MD  Insulin Pen Needle (UNIFINE PENTIPS) 31G X 5 MM MISC USE AS DIRECTED 01/28/17   [name], MD  isosorbide mononitrate (IMDUR) 60 MG 24 hr tablet TAKE 1 TABLET BY MOUTH ONCE DAILY Patient taking differently: Take 60 mg by mouth at bedtime. ** DO NOT CRUSH ** 07/14/18   [name], Ziare M, MD  lansoprazole (PREVACID) 15 MG capsule Take 1 capsule (15 mg total) by mouth daily at 12 noon. 09/13/18   [name], MD  nitroGLYCERIN (NITROSTAT) 0.4 MG SL tablet Place 1 tablet (0.4 mg total) under the tongue every 5 (five) minutes as needed for chest pain. 2 doses before calling for emergent help 06/21/18   [name], MD  oxyCODONE-acetaminophen (PERCOCET) 5-325 MG tablet Take 1 tablet by mouth every 4 (four) hours as needed for severe pain. 02/07/19 02/07/20  Waynetta Sandy, MD  oxymetazoline (AFRIN) 0.05 % nasal spray Place 1 spray into both nostrils 2 (two) times daily as needed for congestion.    [provider]  sertraline (ZOLOFT) 100 MG tablet TAKE 1 & 1/2 (ONE & ONE-HALF) TABLETS BY MOUTH ONCE DAILY 03/06/19   [name], MD  sodium bicarbonate 650 MG tablet Take 650 mg by mouth 2 (two) times daily. 09/25/18   [provider]  tamsulosin (FLOMAX) 0.4 MG CAPS capsule TAKE 1 CAPSULE BY MOUTH ONCE DAILY AFTER SUPPER Patient taking differently: Take 0.4 mg by mouth daily after supper.  05/02/18   [name], MD  triamcinolone cream (KENALOG) 0.1 % APPLY TO CREAM EXTERNALLY AFFECTED AREA TWICE DAILY 7 TO FOR 10 DAYS Patient taking differently: Apply 1 application topically daily as needed (itching).  01/04/19   [name], MD  vitamin B-12 (CYANOCOBALAMIN) 1000 MCG tablet Take 1,000 mcg by mouth daily.    [provider]    Family History Family History  Problem Relation Age of Onset   Stroke Mother    Leukemia Father    Stroke Sister    Heart attack Brother     Social  History Social  History   Tobacco Use   Smoking status: Former Smoker    Packs/day: 1.50    Years: 54.00    Pack years: 81.00    Types: Cigarettes    Quit date: 07/18/2007    Years since quitting: 11.7   Smokeless tobacco: Never Used   Tobacco comment: did not discuss LdCT but being evaluated for chole currently  Substance Use Topics   Alcohol use: Yes    Comment: rare   Drug use: No     Allergies   Bee venom, Lyrica [pregabalin], Prednisone, and Zocor [simvastatin]   Review of Systems Review of Systems Ten systems reviewed and are negative for acute change, except as noted in the HPI.   Physical Exam Updated Vital Signs BP (!) 168/73    Pulse 70    Temp 97.8 F (36.6 C) (Oral)    Resp 12    SpO2 97%   Physical Exam Vitals signs and nursing note reviewed.  Constitutional:      General: He is not in acute distress.    Appearance: He is well-developed. He is not diaphoretic.  HENT:     Head: Normocephalic and atraumatic.  Eyes:     General: No scleral icterus.    Conjunctiva/sclera: Conjunctivae normal.  Neck:     Musculoskeletal: Normal range of motion and neck supple.  Cardiovascular:     Rate and Rhythm: Normal rate and regular rhythm.     Heart sounds: Normal heart sounds.     Comments: AV fistula in the right upper extremity with palpable thrill Pulmonary:     Effort: Pulmonary effort is normal. No respiratory distress.     Breath sounds: Normal breath sounds.  Abdominal:     Palpations: Abdomen is soft.     Tenderness: There is no abdominal tenderness.  Musculoskeletal:     Comments: Full range of motion without pain or swelling of the right elbow  Skin:    General: Skin is warm and dry.     Comments: Abrasion to the right elbow  Neurological:     Mental Status: He is alert.  Psychiatric:        Behavior: Behavior normal.      ED Treatments / Results  Labs (all labs ordered are listed, but only abnormal results are displayed) Labs Reviewed    CBC WITH DIFFERENTIAL/PLATELET - Abnormal; Notable for the following components:      Result Value   RBC 3.85 (*)    Hemoglobin 11.7 (*)    HCT 37.9 (*)    RDW 15.8 (*)    Eosinophils Absolute 0.6 (*)    All other components within normal limits  BASIC METABOLIC PANEL - Abnormal; Notable for the following components:   Potassium 6.0 (*)    Chloride 116 (*)    CO2 12 (*)    Glucose, Bld 100 (*)    BUN 100 (*)    Creatinine, Ser 7.70 (*)    Calcium 7.6 (*)    GFR calc non Af Amer 6 (*)    GFR calc Af Amer 7 (*)    All other components within normal limits    EKG EKG Interpretation  Date/Time:  Saturday April 01 2019 13:10:52 EDT Ventricular Rate:  70 PR Interval:  134 QRS Duration: 164 QT Interval:  472 QTC Calculation: 509 R Axis:   -103 Text Interpretation:  Electronic atrial pacemaker Right bundle branch block Inferior infarct , age undetermined Possible Anterolateral infarct , age undetermined Abnormal ECG  Confirmed by Quintella Reichert [phone]) on 04/01/2019 4:00:10 PM   Radiology Ct Head Wo Contrast  Result Date: 04/01/2019 CLINICAL DATA:  Rolled out of bed. EXAM: CT HEAD WITHOUT CONTRAST TECHNIQUE: Contiguous axial images were obtained from the base of the skull through the vertex without intravenous contrast. COMPARISON:  CT head dated April 03, 2016. FINDINGS: Brain: No evidence of acute infarction, hemorrhage, hydrocephalus, extra-axial collection or mass lesion/mass effect. Stable atrophy and chronic microvascular ischemic changes. Unchanged remote lacunar infarcts in the left basal ganglia and thalamus. Vascular: Calcified atherosclerosis at the skullbase. No hyperdense vessel. Skull: Normal. Negative for fracture or focal lesion. Sinuses/Orbits: No acute finding. Postsurgical changes to both globes. Other: None. IMPRESSION: 1. No acute intracranial abnormality. Stable atrophy and chronic microvascular ischemic changes. Electronically Signed   By: Titus Dubin  M.D.   On: 04/01/2019 14:26    Procedures .Critical Care Performed by: Margarita Mail, PA-C Authorized by: Margarita Mail, PA-C   Critical care provider statement:    Critical care time (minutes):  50   Critical care time was exclusive of:  Separately billable procedures and treating other patients   Critical care was necessary to treat or prevent imminent or life-threatening deterioration of the following conditions:  Renal failure   Critical care was time spent personally by me on the following activities:  Discussions with consultants, evaluation of patient's response to treatment, examination of patient, ordering and performing treatments and interventions, ordering and review of laboratory studies, ordering and review of radiographic studies, pulse oximetry, re-evaluation of patient's condition, obtaining history from patient or surrogate and review of old charts   (including critical care time)  Medications Ordered in ED Medications - No data to display   Initial Impression / Assessment and Plan / ED Course  I have reviewed the triage vital signs and the nursing notes.  Pertinent labs & imaging results that were available during my care of the patient were reviewed by me and considered in my medical decision making (see chart for details).  Clinical Course as of Mar 31 1604  Sat Apr 01, 2019  1547 Potassium(!): 6.0 [AH]  9390 Basic metabolic panel(!) [AH]    Clinical Course User Index [AH] Kenton Kingfisher, Halima Fogal, PA-C       ZE:SPQZ, weakness VS: BP (!) 168/59    Pulse 75    Temp 97.8 F (36.6 C) (Oral)    Resp 20    SpO2 98%  RA:[id number] is gathered by patient  and emr, son and granddaughter at bedside. DDX:The differential diagnosis of weakness includes but is not limited to neurologic causes (GBS, myasthenia gravis, CVA, MS, ALS, transverse myelitis, spinal cord injury, CVA, botulism, ) and other causes: ACS, Arrhythmia, syncope, orthostatic hypotension, sepsis, hypoglycemia,  electrolyte disturbance, hypothyroidism, respiratory failure, symptomatic anemia, dehydration, heat injury, polypharmacy, malignancy. Labs: I reviewed the labs which show hyperkalemia, BUN which is markedly elevated at 100, elevated creatinine level, worsening kidney function overall, mild normocytic anemia  Imaging: I personally reviewed the images (CT head) which show(s) no acute abnormalities EKG:  EKG Interpretation  Date/Time:  Saturday April 01 2019 13:10:52 EDT Ventricular Rate:  70 PR Interval:  134 QRS Duration: 164 QT Interval:  472 QTC Calculation: 509 R Axis:   -103 Text Interpretation:  Electronic atrial pacemaker Right bundle branch block Inferior infarct , age undetermined Possible Anterolateral infarct , age undetermined Abnormal ECG Confirmed by Quintella Reichert [phone]) on 04/01/2019 4:00:10 PM      MDM: Patient here after mechanical fall,  no acute injuries.  More somnolent than usual.  I spoke with Dr. Jonnie Finner who was consulted on the patient and discussed the case with his nephrologist Dr. Justin Mend.  Patient will need immediate inpatient admission and dialysis.  He will be given Lokelma 10 mg twice daily.  First dose to be given immediately.  He is stable throughout his visit here in the emergency department. Patient disposition: Admit Patient condition: Stable. The patient appears reasonably stabilized for admission considering the current resources, flow, and capabilities available in the ED at this time, and I doubt any other [facility] requiring further screening and/or treatment in the ED prior to admission.   Final Clinical Impressions(s) / ED Diagnoses   Final diagnoses:  None    ED Discharge Orders    None       Margarita Mail, PA-C 04/02/19 0003    Quintella Reichert, MD 04/04/19 1118

## 2019-04-01 NOTE — Consult Note (Signed)
Renal Service Consult Note [facility]  Rodney Farley 04/01/2019 [name] Requesting Physician:  Rodney Farley, ED [facility]  Reason for Consult:  CKD V patient w/ fall HPI: The patient is a 85 y.o. year-old with hx of DM2, HTN and CKD stage 4/5 f/b Rodney Farley at [facility] presenting to ED w/ fall at home.  In ED labs showed BUN 100 , Cr 7.70 and K 6.0.  Last week at [facility] BUN was low 80's and creat 6.0.  Asked to see for renal failure, need for dialysis.    Patient notes last few weeks sig fatigue, appetite loss. Had N/V for 2-3 days one week ago but now is eating ok.  No SOB, no confusion or jerking.  Is more lethargic per the son.    ROS  denies CP  no joint pain   no HA  no blurry vision  no rash  no diarrhea  no nausea/ vomiting  no dysuria  no difficulty voiding  no change in urine color    Past Medical History  Past Medical History:  Diagnosis Date  . AICD (automatic cardioverter/defibrillator) present   . AKI (acute kidney injury) ([place]) 03/27/2017  . Anemia   . Anginal pain ([place])   . Anxiety   . Arm pain 05/08/2015   LEFT ARM  . Arthritis   . Atrioventricular block, complete ([place])    a. 2010 s/p pacemaker.  . Bell's palsy    left side. after shingles episode  . Bilateral renal cysts 07/23/2017   Simple and hemorrhagic noted on CT ab/pelvis   . BPH associated with nocturia   . Cardiomegaly   . Chronic systolic CHF (congestive heart failure) ([place])    EF normalized by Echo 2019  . CKD (chronic kidney disease), stage IV ([place])   . COPD (chronic obstructive pulmonary disease) ([facility])    Severe  . Coronary artery disease    a. s/p MI in 1994/1995 while in [place] s/p questionable PCI. 03/2015: progression of disease, for staged PCI.  [name] Depression   . Diabetic peripheral neuropathy ([place])   . Diarrhea   . Diverticulitis   . DOE (dyspnea on exertion) 03/28/2015  . Full dentures   . Gallstones   . GERD (gastroesophageal reflux disease)   . Gout   . Hard  of hearing    B/L  . Heart murmur   . History of chronic pancreatitis 07/23/2017   noted on CT abd/pelvis  . History of shingles   . Hypercholesterolemia   . Hypertension   . Ischemic cardiomyopathy   . MI (myocardial infarction) ([place]) 1994; 1995  . Neuropathy    IN LOWER EXTREMITIES  . Nosebleed 10/06/2017   for 2 months most recent 10/06/2017  . Obesity   . Pacemaker    medtronic>>> MDT ICD 09/23/15  . Ruptured appendicitis   . Sleep apnea    "sleeps w/humidifyer when he panics and gets short of breath" (04/08/2015)  . TIA (transient ischemic attack) X 3  . Trigger middle finger of left hand   . Type II diabetes mellitus ([place])   . Wears glasses   . Wears hearing aid    Past Surgical History  Past Surgical History:  Procedure Laterality Date  . APPENDECTOMY    . AV FISTULA PLACEMENT Right 02/07/2019   Procedure: ARTERIOVENOUS (AV) FISTULA CREATION RIGHT UPPER ARM;  Surgeon: Rodney Sandy, MD;  Location: [place];  Service: Vascular;  Laterality: Right;  . CARDIAC CATHETERIZATION N/A  03/29/2015   Procedure: Right/Left Heart Cath and Coronary Angiography;  Surgeon: Rodney M [name], MD;  Location: [place] CV LAB;  Service: Cardiovascular;  Laterality: N/A;  . [place]   "after my MI; put me on heart RX after cath"  . CARDIAC CATHETERIZATION N/A 04/09/2015   Procedure: Coronary Stent Intervention;  Surgeon: Rodney M [name], MD;  Location: [place] CV [facility];  Service: Cardiovascular;  Laterality: N/A;  . CATARACT EXTRACTION W/ INTRAOCULAR LENS  IMPLANT, BILATERAL    . CHOLECYSTECTOMY N/A 10/13/2017   Procedure: LAPAROSCOPIC CHOLECYSTECTOMY WITH LYSIS OF ADHESIONS;  Surgeon: Rodney Roup, MD;  Location: [facility];  Service: General;  Laterality: N/A;  . COLONOSCOPY    . DENTAL SURGERY    . EP IMPLANTABLE DEVICE N/A 09/23/2015   MDT CRT-D, Rodney. Caryl Farley  . HIATAL HERNIA REPAIR  1977  . ILEOCECETOMY N/A 03/27/2017   Procedure: ILEOCECECTOMY;  Surgeon:  Rodney Roup, MD;  Location: [place];  Service: General;  Laterality: N/A;  . INSERT / REPLACE / REMOVE PACEMAKER  07/2008   Complete heart block status post DDD with good function  . LAPAROTOMY N/A 03/27/2017   Procedure: EXPLORATORY LAPAROTOMY;  Surgeon: Rodney Roup, MD;  Location: [place];  Service: General;  Laterality: N/A;  . TONSILLECTOMY    . UPPER GI ENDOSCOPY     Family History  Family History  Problem Relation Age of Onset  . Stroke Mother   . Leukemia Father   . Stroke Sister   . Heart attack Brother    Social History  reports that he quit smoking about 11 years ago. His smoking use included cigarettes. He has a 81.00 pack-year smoking history. He has never used smokeless tobacco. He reports current alcohol use. He reports that he does not use drugs. Allergies  Allergies  Allergen Reactions  . Bee Venom Anaphylaxis  . Lyrica [Pregabalin] Other (See Comments)    hallucinations  . Prednisone Other (See Comments)    hallucinations  . Zocor [Simvastatin] Nausea Only and Other (See Comments)    Headache with brand name only.  Can take the generic.   Home medications Prior to Admission medications   Medication Sig Start Date End Date Taking? Authorizing Provider  albuterol (PROVENTIL HFA;VENTOLIN HFA) 108 (90 Base) MCG/ACT inhaler Inhale 2 puffs into the lungs every 6 (six) hours as needed for wheezing or shortness of breath. 08/12/17   Rodney Olp, MD  aspirin EC 81 MG tablet Take 81 mg by mouth daily.    [provider]  atorvastatin (LIPITOR) 40 MG tablet Take 1 tablet (40 mg total) by mouth daily. Patient taking differently: Take 40 mg by mouth at bedtime.  06/14/17   Rodney Olp, MD  calcitRIOL (ROCALTROL) 0.25 MCG capsule Take 0.25 mcg by mouth daily.  04/12/18   [provider]  Carboxymethylcellul-Glycerin (LUBRICATING EYE DROPS OP) Place 1 drop into both eyes 3 (three) times daily as needed (dry eyes).    [provider]  carvedilol (COREG) 12.5 MG tablet Take 12.5 mg by mouth 2 (two) times daily. 10/14/18   [provider]  ciclopirox (PENLAC) 8 % solution Apply topically at bedtime. Apply over nail and surrounding skin. Apply daily over previous coat. Remove weekly with file or polish remover. 02/09/19   Evelina Bucy, DPM  Epoetin Alfa (PROCRIT IJ) Inject 1 Dose as directed every Thursday. Through Rodney. Justin Farley     [provider]  erythromycin ophthalmic  ointment Place 1 application into the left eye at bedtime.    [provider]  furosemide (LASIX) 40 MG tablet Take 1 tablet by mouth twice daily Patient taking differently: 80 mg.  01/11/19   Farley, Hadi M, MD  gabapentin (NEURONTIN) 100 MG capsule TAKE 2 CAPSULES BY MOUTH AT BEDTIME 02/02/19   Rodney Olp, MD  glucose blood (FREESTYLE TEST STRIPS) test strip Use to check blood sugar daily 06/18/17   Rodney Olp, MD  glucose monitoring kit (FREESTYLE) monitoring kit USE TO MONITOR BLOOD GLUCOSE AS DIRECTED 02/04/17   Rodney Olp, MD  Insulin Glargine (LANTUS SOLOSTAR) 100 UNIT/ML Solostar Pen Inject 15 Units into the skin daily. Patient taking differently: Inject 15 Units into the skin at bedtime.  08/08/18   Rodney Olp, MD  Insulin Pen Needle (UNIFINE PENTIPS) 31G X 5 MM MISC USE AS DIRECTED 01/28/17   Rodney Olp, MD  isosorbide mononitrate (IMDUR) 60 MG 24 hr tablet TAKE 1 TABLET BY MOUTH ONCE DAILY Patient taking differently: Take 60 mg by mouth at bedtime. ** DO NOT CRUSH ** 07/14/18   Farley, Paden M, MD  lansoprazole (PREVACID) 15 MG capsule Take 1 capsule (15 mg total) by mouth daily at 12 noon. 09/13/18   Rodney Olp, MD  nitroGLYCERIN (NITROSTAT) 0.4 MG SL tablet Place 1 tablet (0.4 mg total) under the tongue every 5 (five) minutes as needed for chest pain. 2 doses before calling for emergent help 06/21/18   Rodney Olp, MD  oxyCODONE-acetaminophen (PERCOCET) 5-325 MG tablet Take 1 tablet by  mouth every 4 (four) hours as needed for severe pain. 02/07/19 02/07/20  Rodney Sandy, MD  oxymetazoline (AFRIN) 0.05 % nasal spray Place 1 spray into both nostrils 2 (two) times daily as needed for congestion.    [provider]  sertraline (ZOLOFT) 100 MG tablet TAKE 1 & 1/2 (ONE & ONE-HALF) TABLETS BY MOUTH ONCE DAILY 03/06/19   Rodney Olp, MD  sodium bicarbonate 650 MG tablet Take 650 mg by mouth 2 (two) times daily. 09/25/18   [provider]  tamsulosin (FLOMAX) 0.4 MG CAPS capsule TAKE 1 CAPSULE BY MOUTH ONCE DAILY AFTER SUPPER Patient taking differently: Take 0.4 mg by mouth daily after supper.  05/02/18   Rodney Olp, MD  triamcinolone cream (KENALOG) 0.1 % APPLY TO CREAM EXTERNALLY AFFECTED AREA TWICE DAILY 7 TO FOR 10 DAYS Patient taking differently: Apply 1 application topically daily as needed (itching).  01/04/19   Rodney Olp, MD  vitamin B-12 (CYANOCOBALAMIN) 1000 MCG tablet Take 1,000 mcg by mouth daily.    [provider]   Liver Function Tests No results for input(s): AST, ALT, ALKPHOS, BILITOT, PROT, ALBUMIN in the last 168 hours. No results for input(s): LIPASE, AMYLASE in the last 168 hours. CBC Recent Labs  Lab 04/01/19 1322  WBC 8.6  NEUTROABS 6.0  HGB 11.7*  HCT 37.9*  MCV 98.4  PLT 762   Basic Metabolic Panel Recent Labs  Lab 04/01/19 1322  NA 141  K 6.0*  CL 116*  CO2 12*  GLUCOSE 100*  BUN 100*  CREATININE 7.70*  CALCIUM 7.6*   Iron/TIBC/Ferritin/ %Sat    Component Value Date/Time   IRON 23 (L) 03/16/2019 0920   TIBC 225 (L) 03/16/2019 0920   FERRITIN 116 03/16/2019 0920   IRONPCTSAT 10 (L) 03/16/2019 0920    Vitals:   04/01/19 1745 04/01/19 1800 04/01/19 1845 04/01/19 1900  BP:  131/72  (!) 147/73  Pulse: 70  73 67  Resp: (!) 21 17 (!) 21 17  Temp:      TempSrc:      SpO2: 93%  100% (!) 86%    Exam Gen alert, pleasant, elderly WM No rash, cyanosis or gangrene Sclera anicteric,  throat clear  No jvd or bruits Chest clear bilat, no rales or wheezing RRR no MRG Abd soft ntnd no mass or ascites +bs GU normal male MS no joint effusions or deformity Ext no LE edema, no wounds or ulcers Neuro is alert, Ox 3 , nf RUA AVF+bruit   Home meds:  - carvedilol 12.5 bid/ furosemide 80 mg 1-2 per day  - sertraline 150 qd/ oxycodone- aceta qid prn/ gabapentin 200 hs  - aspirin 81 / isosorbide mononitrate 60 qd/ atorvastatin 40 hs  - insulin glargine 15 hs  - lansoprazole 66m qd/ tamsulosin 0.4 qd  - prn's/ vitamins/ supplement/ eyedrops/ creams      Assessment/ Plan: 1. Advanced CKD - w/ progression and uremic symptoms. AVF won't be ready for another 4- 6 wks. Would recommend admission and initiation of HD.  Will hold lasix as pt not vol overloaded. [facility], plan HD 2-3 sessions here and set up for OP HD.   2. HTN - bp's OK, cont meds 3. DM2 on insulin  4. Volume - looks euvolemic. Hold lasix for now.       RKelly Splinter MD 04/01/2019, 7:38 PM

## 2019-04-01 NOTE — ED Triage Notes (Signed)
Per [facility], pt from home after rolling out of bed trying to answer his phone. Has skin tear to right elbow. No loc. After pt was helped into bed he became lethargic, increased weakness and increased work of breathing. That subsided. VSS. Axox4. Lives alone.

## 2019-04-02 ENCOUNTER — Other Ambulatory Visit: Payer: Self-pay

## 2019-04-02 LAB — GLUCOSE, CAPILLARY: Glucose-Capillary: 106 mg/dL — ABNORMAL HIGH (ref 70–99)

## 2019-04-02 LAB — CBC
HCT: 38 % — ABNORMAL LOW (ref 39.0–52.0)
Hemoglobin: 12.3 g/dL — ABNORMAL LOW (ref 13.0–17.0)
MCH: 31.5 pg (ref 26.0–34.0)
MCHC: 32.4 g/dL (ref 30.0–36.0)
MCV: 97.4 fL (ref 80.0–100.0)
Platelets: 174 10*3/uL (ref 150–400)
RBC: 3.9 MIL/uL — ABNORMAL LOW (ref 4.22–5.81)
RDW: 15.9 % — ABNORMAL HIGH (ref 11.5–15.5)
WBC: 10.9 10*3/uL — ABNORMAL HIGH (ref 4.0–10.5)
nRBC: 0 % (ref 0.0–0.2)

## 2019-04-02 LAB — CREATININE, SERUM
Creatinine, Ser: 8 mg/dL — ABNORMAL HIGH (ref 0.61–1.24)
GFR calc Af Amer: 6 mL/min — ABNORMAL LOW (ref >60)
GFR calc non Af Amer: 6 mL/min — ABNORMAL LOW (ref >60)

## 2019-04-02 LAB — COMPREHENSIVE METABOLIC PANEL
ALT: 14 U/L (ref 0–44)
AST: 12 U/L — ABNORMAL LOW (ref 15–41)
Albumin: 3.3 g/dL — ABNORMAL LOW (ref 3.5–5.0)
Alkaline Phosphatase: 102 U/L (ref 38–126)
Anion gap: 11 (ref 5–15)
BUN: 100 mg/dL — ABNORMAL HIGH (ref 8–23)
CO2: 13 mmol/L — ABNORMAL LOW (ref 22–32)
Calcium: 7.6 mg/dL — ABNORMAL LOW (ref 8.9–10.3)
Chloride: 119 mmol/L — ABNORMAL HIGH (ref 98–111)
Creatinine, Ser: 7.84 mg/dL — ABNORMAL HIGH (ref 0.61–1.24)
GFR calc Af Amer: 7 mL/min — ABNORMAL LOW (ref >60)
GFR calc non Af Amer: 6 mL/min — ABNORMAL LOW (ref >60)
Glucose, Bld: 78 mg/dL (ref 70–99)
Potassium: 5.9 mmol/L — ABNORMAL HIGH (ref 3.5–5.1)
Sodium: 143 mmol/L (ref 135–145)
Total Bilirubin: 0.5 mg/dL (ref 0.3–1.2)
Total Protein: 6.4 g/dL — ABNORMAL LOW (ref 6.5–8.1)

## 2019-04-02 LAB — CBG MONITORING, ED
Glucose-Capillary: 59 mg/dL — ABNORMAL LOW (ref 70–99)
Glucose-Capillary: 79 mg/dL (ref 70–99)

## 2019-04-02 LAB — HEMOGLOBIN A1C
Hgb A1c MFr Bld: 5.7 % — ABNORMAL HIGH (ref 4.8–5.6)
Mean Plasma Glucose: 116.89 mg/dL

## 2019-04-02 LAB — SARS CORONAVIRUS 2 (TAT 6-24 HRS): SARS Coronavirus 2: NEGATIVE

## 2019-04-02 MED ORDER — POLYVINYL ALCOHOL 1.4 % OP SOLN: 1.0000 [drp] | Freq: Three times a day (TID) | OPHTHALMIC | Status: DC | PRN

## 2019-04-02 MED ORDER — SORBITOL 70 % SOLN
30.0000 mL | Status: DC | PRN
  Filled 2019-04-02: qty 30

## 2019-04-02 MED ORDER — HEPARIN SODIUM (PORCINE) 1000 UNIT/ML DIALYSIS
20.0000 [IU]/kg | INTRAMUSCULAR | Status: DC | PRN
  Filled 2019-04-02: qty 2

## 2019-04-02 MED ORDER — ONDANSETRON HCL 4 MG PO TABS: 4.0000 mg | ORAL_TABLET | Freq: Four times a day (QID) | ORAL | Status: DC | PRN

## 2019-04-02 MED ORDER — ZOLPIDEM TARTRATE 5 MG PO TABS
5.0000 mg | ORAL_TABLET | Freq: Every evening | ORAL | Status: DC | PRN
  Administered 2019-04-05 – 2019-04-07 (×3): 5 mg via ORAL
  Filled 2019-04-02 (×3): qty 1

## 2019-04-02 MED ORDER — OXYMETAZOLINE HCL 0.05 % NA SOLN
1.0000 | Freq: Two times a day (BID) | NASAL | Status: DC | PRN
  Filled 2019-04-02: qty 30

## 2019-04-02 MED ORDER — METOCLOPRAMIDE HCL 5 MG/ML IJ SOLN: 10.0000 mg | Freq: Three times a day (TID) | INTRAMUSCULAR | Status: DC | PRN

## 2019-04-02 MED ORDER — NITROGLYCERIN 0.4 MG SL SUBL: 0.4000 mg | SUBLINGUAL_TABLET | SUBLINGUAL | Status: DC | PRN

## 2019-04-02 MED ORDER — ERYTHROMYCIN 5 MG/GM OP OINT
1.0000 "application " | TOPICAL_OINTMENT | Freq: Every day | OPHTHALMIC | Status: DC
  Administered 2019-04-02 – 2019-04-07 (×6): 1 via OPHTHALMIC
  Filled 2019-04-02 (×3): qty 3.5

## 2019-04-02 MED ORDER — HYDROXYZINE HCL 25 MG PO TABS
25.0000 mg | ORAL_TABLET | Freq: Three times a day (TID) | ORAL | Status: DC | PRN
  Filled 2019-04-02: qty 1

## 2019-04-02 MED ORDER — PANTOPRAZOLE SODIUM 40 MG PO TBEC
40.0000 mg | DELAYED_RELEASE_TABLET | Freq: Every day | ORAL | Status: DC
  Administered 2019-04-02 – 2019-04-09 (×8): 40 mg via ORAL
  Filled 2019-04-02 (×9): qty 1

## 2019-04-02 MED ORDER — CAMPHOR-MENTHOL 0.5-0.5 % EX LOTN: 1.0000 "application " | TOPICAL_LOTION | Freq: Three times a day (TID) | CUTANEOUS | Status: DC | PRN

## 2019-04-02 MED ORDER — HEPARIN SODIUM (PORCINE) 5000 UNIT/ML IJ SOLN: 5000.0000 [IU] | Freq: Three times a day (TID) | INTRAMUSCULAR | Status: DC

## 2019-04-02 MED ORDER — DOCUSATE SODIUM 283 MG RE ENEM
1.0000 | ENEMA | RECTAL | Status: DC | PRN
  Filled 2019-04-02: qty 1

## 2019-04-02 MED ORDER — ASPIRIN EC 81 MG PO TBEC
81.0000 mg | DELAYED_RELEASE_TABLET | Freq: Every day | ORAL | Status: DC
  Administered 2019-04-02 – 2019-04-09 (×8): 81 mg via ORAL
  Filled 2019-04-02 (×9): qty 1

## 2019-04-02 MED ORDER — PROCHLORPERAZINE EDISYLATE 10 MG/2ML IJ SOLN: 10.0000 mg | Freq: Four times a day (QID) | INTRAMUSCULAR | Status: DC | PRN

## 2019-04-02 MED ORDER — ISOSORBIDE MONONITRATE ER 30 MG PO TB24: 60.0000 mg | ORAL_TABLET | Freq: Every day | ORAL | Status: DC

## 2019-04-02 MED ORDER — ACETAMINOPHEN 650 MG RE SUPP: 650.0000 mg | Freq: Four times a day (QID) | RECTAL | Status: DC | PRN

## 2019-04-02 MED ORDER — CARVEDILOL 12.5 MG PO TABS
12.5000 mg | ORAL_TABLET | Freq: Two times a day (BID) | ORAL | Status: DC
  Administered 2019-04-02 – 2019-04-09 (×12): 12.5 mg via ORAL
  Filled 2019-04-02 (×14): qty 1

## 2019-04-02 MED ORDER — ISOSORBIDE MONONITRATE ER 30 MG PO TB24
30.0000 mg | ORAL_TABLET | Freq: Every day | ORAL | Status: DC
  Administered 2019-04-02 – 2019-04-03 (×2): 30 mg via ORAL
  Filled 2019-04-02 (×2): qty 1

## 2019-04-02 MED ORDER — ACETAMINOPHEN 325 MG PO TABS: 650.0000 mg | ORAL_TABLET | Freq: Four times a day (QID) | ORAL | Status: DC | PRN

## 2019-04-02 MED ORDER — NEPRO/CARBSTEADY PO LIQD
237.0000 mL | Freq: Three times a day (TID) | ORAL | Status: DC | PRN
  Filled 2019-04-02: qty 237

## 2019-04-02 MED ORDER — CALCITRIOL 0.25 MCG PO CAPS
0.2500 ug | ORAL_CAPSULE | Freq: Every day | ORAL | Status: DC
  Administered 2019-04-02 – 2019-04-09 (×8): 0.25 ug via ORAL
  Filled 2019-04-02 (×9): qty 1

## 2019-04-02 MED ORDER — INSULIN ASPART 100 UNIT/ML ~~LOC~~ SOLN: 0.0000 [IU] | Freq: Every day | SUBCUTANEOUS | Status: DC

## 2019-04-02 MED ORDER — HEPARIN SODIUM (PORCINE) 5000 UNIT/ML IJ SOLN
5000.0000 [IU] | Freq: Three times a day (TID) | INTRAMUSCULAR | Status: AC
  Administered 2019-04-02 (×2): 5000 [IU] via SUBCUTANEOUS
  Filled 2019-04-02 (×2): qty 1

## 2019-04-02 MED ORDER — CALCIUM CARBONATE ANTACID 1250 MG/5ML PO SUSP
500.0000 mg | Freq: Four times a day (QID) | ORAL | Status: DC | PRN
  Filled 2019-04-02: qty 5

## 2019-04-02 MED ORDER — SODIUM BICARBONATE 650 MG PO TABS
650.0000 mg | ORAL_TABLET | Freq: Two times a day (BID) | ORAL | Status: DC
  Administered 2019-04-02 – 2019-04-05 (×7): 650 mg via ORAL
  Filled 2019-04-02 (×8): qty 1

## 2019-04-02 MED ORDER — INSULIN GLARGINE 100 UNIT/ML ~~LOC~~ SOLN
5.0000 [IU] | Freq: Every day | SUBCUTANEOUS | Status: DC
  Administered 2019-04-02: 5 [IU] via SUBCUTANEOUS
  Filled 2019-04-02 (×2): qty 0.05

## 2019-04-02 MED ORDER — ATORVASTATIN CALCIUM 40 MG PO TABS
40.0000 mg | ORAL_TABLET | Freq: Every day | ORAL | Status: DC
  Administered 2019-04-02 – 2019-04-08 (×7): 40 mg via ORAL
  Filled 2019-04-02 (×7): qty 1

## 2019-04-02 MED ORDER — SERTRALINE HCL 50 MG PO TABS
150.0000 mg | ORAL_TABLET | Freq: Every day | ORAL | Status: DC
  Administered 2019-04-02 – 2019-04-09 (×8): 150 mg via ORAL
  Filled 2019-04-02 (×10): qty 1

## 2019-04-02 MED ORDER — INSULIN ASPART 100 UNIT/ML ~~LOC~~ SOLN
0.0000 [IU] | Freq: Three times a day (TID) | SUBCUTANEOUS | Status: DC
  Administered 2019-04-05 – 2019-04-08 (×2): 1 [IU] via SUBCUTANEOUS

## 2019-04-02 MED ORDER — INSULIN GLARGINE 100 UNIT/ML SOLOSTAR PEN: 15.0000 [IU] | PEN_INJECTOR | Freq: Every day | SUBCUTANEOUS | Status: DC

## 2019-04-02 MED ORDER — INSULIN GLARGINE 100 UNIT/ML SOLOSTAR PEN: 5.0000 [IU] | PEN_INJECTOR | Freq: Every day | SUBCUTANEOUS | Status: DC

## 2019-04-02 MED ORDER — ONDANSETRON HCL 4 MG/2ML IJ SOLN: 4.0000 mg | Freq: Four times a day (QID) | INTRAMUSCULAR | Status: DC | PRN

## 2019-04-02 MED ORDER — VITAMIN B-12 1000 MCG PO TABS
1000.0000 ug | ORAL_TABLET | Freq: Every day | ORAL | Status: DC
  Administered 2019-04-02 – 2019-04-09 (×8): 1000 ug via ORAL
  Filled 2019-04-02 (×9): qty 1

## 2019-04-02 NOTE — ED Notes (Signed)
Dr. Jonnie Finner paged regarding patient's urinary retention. Awaiting response.

## 2019-04-02 NOTE — Consult Note (Signed)
Chief Complaint: Patient was seen in consultation today for tunneled HD catheter placement.  Referring Physician(s): Dr. Jonnie Finner  Supervising Physician: Arne Cleveland  Patient Status: [facility] - ED  History of Present Illness: Rodney Farley is a 85 y.o. male with a past medical history significant for anxiety, depression, arthritis, neuropathy, BPH, GERD, anemia, COPD, HTN, HLD, CAD, AV block s/p pacemaker placement, CHF, MI, TIA and ESRD s/p AV fistula placement 02/07/19 not yet on dialysis who presented to [facility] ED on 04/01/19 after a fall at home. He was noted to be more somnolent than usual by his granddaughter in the ED. Initial work up was significant for K+ 6.0, creatinine 7.70, BUN 100. Nephrology was consulted and recommended inpatient admission to begin urgent dialysis.   Patient sleeping upon entry to room, easily arouses to voice cues. He tells me that he has been more tired than usual lately and does not really have an appetite. He also states that he "more wobbly than before" which caused him to fall while reaching for his phone. He is also very frustrated because he has not been able to urinate since arrival to the hospital which is not normal for him. He understands that he will need dialysis and that his AV fistula is not yet matured, so he will need to have a tunneled HD catheter placed in order to receive HD.   Past Medical History:  Diagnosis Date  . AICD (automatic cardioverter/defibrillator) present   . AKI (acute kidney injury) ([place]) 03/27/2017  . Anemia   . Anginal pain ([place])   . Anxiety   . Arm pain 05/08/2015   LEFT ARM  . Arthritis   . Atrioventricular block, complete ([place])    a. 2010 s/p pacemaker.  . Bell's palsy    left side. after shingles episode  . Bilateral renal cysts 07/23/2017   Simple and hemorrhagic noted on CT ab/pelvis   . BPH associated with nocturia   . Cardiomegaly   . Chronic systolic CHF (congestive heart failure) ([place])    EF  normalized by Echo 2019  . CKD (chronic kidney disease), stage IV ([place])   . COPD (chronic obstructive pulmonary disease) (HCC)    Severe  . Coronary artery disease    a. s/p MI in 1994/1995 while in [place] s/p questionable PCI. 03/2015: progression of disease, for staged PCI.  [name] Depression   . Diabetic peripheral neuropathy ([place])   . Diarrhea   . Diverticulitis   . DOE (dyspnea on exertion) 03/28/2015  . Full dentures   . Gallstones   . GERD (gastroesophageal reflux disease)   . Gout   . Hard of hearing    B/L  . Heart murmur   . History of chronic pancreatitis 07/23/2017   noted on CT abd/pelvis  . History of shingles   . Hypercholesterolemia   . Hypertension   . Ischemic cardiomyopathy   . MI (myocardial infarction) ([place]) 1994; 1995  . Neuropathy    IN LOWER EXTREMITIES  . Nosebleed 10/06/2017   for 2 months most recent 10/06/2017  . Obesity   . Pacemaker    medtronic>>> MDT ICD 09/23/15  . Ruptured appendicitis   . Sleep apnea    "sleeps w/humidifyer when he panics and gets short of breath" (04/08/2015)  . TIA (transient ischemic attack) X 3  . Trigger middle finger of left hand   . Type II diabetes mellitus ([place])   . Wears glasses   . Wears hearing aid  Past Surgical History:  Procedure Laterality Date  . APPENDECTOMY    . AV FISTULA PLACEMENT Right 02/07/2019   Procedure: ARTERIOVENOUS (AV) FISTULA CREATION RIGHT UPPER ARM;  Surgeon: Waynetta Sandy, MD;  Location: [place];  Service: Vascular;  Laterality: Right;  . CARDIAC CATHETERIZATION N/A 03/29/2015   Procedure: Right/Left Heart Cath and Coronary Angiography;  Surgeon: Hailey M [name], MD;  Location: [place] [facility];  Service: Cardiovascular;  Laterality: N/A;  . [place]   "after my MI; put me on heart RX after cath"  . CARDIAC CATHETERIZATION N/A 04/09/2015   Procedure: Coronary Stent Intervention;  Surgeon: Jamare M [name], MD;  Location: [place] [facility];  Service:  Cardiovascular;  Laterality: N/A;  . CATARACT EXTRACTION W/ INTRAOCULAR LENS  IMPLANT, BILATERAL    . CHOLECYSTECTOMY N/A 10/13/2017   Procedure: LAPAROSCOPIC CHOLECYSTECTOMY WITH LYSIS OF ADHESIONS;  Surgeon: Ileana Roup, MD;  Location: [facility];  Service: General;  Laterality: N/A;  . COLONOSCOPY    . DENTAL SURGERY    . EP IMPLANTABLE DEVICE N/A 09/23/2015   MDT CRT-D, Dr. Caryl Comes  . HIATAL HERNIA REPAIR  1977  . ILEOCECETOMY N/A 03/27/2017   Procedure: ILEOCECECTOMY;  Surgeon: Ileana Roup, MD;  Location: [place];  Service: General;  Laterality: N/A;  . INSERT / REPLACE / REMOVE PACEMAKER  07/2008   Complete heart block status post DDD with good function  . LAPAROTOMY N/A 03/27/2017   Procedure: EXPLORATORY LAPAROTOMY;  Surgeon: Ileana Roup, MD;  Location: [place];  Service: General;  Laterality: N/A;  . TONSILLECTOMY    . UPPER GI ENDOSCOPY      Allergies: Bee venom, Lyrica [pregabalin], Prednisone, and Zocor [simvastatin]  Medications: Prior to Admission medications   Medication Sig Start Date End Date Taking? Authorizing Provider  aspirin EC 81 MG tablet Take 81 mg by mouth daily.   Yes [provider]  atorvastatin (LIPITOR) 40 MG tablet Take 1 tablet (40 mg total) by mouth daily. Patient taking differently: Take 40 mg by mouth at bedtime.  06/14/17  Yes [name], MD  calcitRIOL (ROCALTROL) 0.25 MCG capsule Take 0.25 mcg by mouth daily.  04/12/18  Yes [provider]  Carboxymethylcellul-Glycerin (LUBRICATING EYE DROPS OP) Place 1 drop into both eyes 3 (three) times daily as needed (dry eyes).   Yes [provider]  carvedilol (COREG) 12.5 MG tablet Take 12.5 mg by mouth 2 (two) times daily. 10/14/18  Yes [provider]  Epoetin Alfa (PROCRIT IJ) Inject 1 Dose as directed every Thursday. Through Dr. Justin Mend    Yes [provider]  erythromycin ophthalmic ointment Place 1 application into the left eye at bedtime.   Yes  [provider]  furosemide (LASIX) 80 MG tablet Take 80 mg by mouth 2 (two) times daily. 02/16/19  Yes [provider]  Insulin Glargine (LANTUS SOLOSTAR) 100 UNIT/ML Solostar Pen Inject 15 Units into the skin daily. Patient taking differently: Inject 15 Units into the skin at bedtime.  08/08/18  Yes [name], MD  isosorbide mononitrate (IMDUR) 60 MG 24 hr tablet TAKE 1 TABLET BY MOUTH ONCE DAILY Patient taking differently: Take 60 mg by mouth at bedtime. ** DO NOT CRUSH ** 07/14/18  Yes [name], Breylin M, MD  lansoprazole (PREVACID) 15 MG capsule Take 1 capsule (15 mg total) by mouth daily at 12 noon. 09/13/18  Yes [name], MD  nitroGLYCERIN (NITROSTAT) 0.4 MG SL tablet Place 1  tablet (0.4 mg total) under the tongue every 5 (five) minutes as needed for chest pain. 2 doses before calling for emergent help 06/21/18  Yes [name], MD  oxymetazoline (AFRIN) 0.05 % nasal spray Place 1 spray into both nostrils 2 (two) times daily as needed for congestion.   Yes [provider]  sertraline (ZOLOFT) 100 MG tablet TAKE 1 & 1/2 (ONE & ONE-HALF) TABLETS BY MOUTH ONCE DAILY Patient taking differently: Take 150 mg by mouth daily.  03/06/19  Yes [name], MD  sodium bicarbonate 650 MG tablet Take 650 mg by mouth 2 (two) times daily. 09/25/18  Yes [provider]  vitamin B-12 (CYANOCOBALAMIN) 1000 MCG tablet Take 1,000 mcg by mouth daily.   Yes [provider]  glucose blood (FREESTYLE TEST STRIPS) test strip Use to check blood sugar daily 06/18/17   [name], MD  glucose monitoring kit (FREESTYLE) monitoring kit USE TO MONITOR BLOOD GLUCOSE AS DIRECTED 02/04/17   [name], MD  Insulin Pen Needle (UNIFINE PENTIPS) 31G X 5 MM MISC USE AS DIRECTED 01/28/17   [name], MD     Family History  Problem Relation Age of Onset  . Stroke Mother   . Leukemia Father   . Stroke Sister   . Heart attack Brother     Social  History   Socioeconomic History  . Marital status: Married    Spouse name: Not on file  . Number of children: 1  . Years of education: college  . Highest education level: Not on file  Occupational History  . Occupation: Retired  Scientific laboratory technician  . Financial resource strain: Not on file  . Food insecurity    Worry: Not on file    Inability: Not on file  . Transportation needs    Medical: Not on file    Non-medical: Not on file  Tobacco Use  . Smoking status: Former Smoker    Packs/day: 1.50    Years: 54.00    Pack years: 81.00    Types: Cigarettes    Quit date: 07/18/2007    Years since quitting: 11.7  . Smokeless tobacco: Never Used  . Tobacco comment: did not discuss LdCT but being evaluated for chole currently  Substance and Sexual Activity  . Alcohol use: Yes    Comment: rare  . Drug use: No  . Sexual activity: Never  Lifestyle  . Physical activity    Days per week: Not on file    Minutes per session: Not on file  . Stress: Not on file  Relationships  . Social Herbalist on phone: Not on file    Gets together: Not on file    Attends religious service: Not on file    Active member of club or organization: Not on file    Attends meetings of clubs or organizations: Not on file    Relationship status: Not on file  Other Topics Concern  . Not on file  Social History Narrative   Married 1994 (together since 1989) 1 son, 1 stepson. 3 grandkids, 6 great grandkids      Retired from [employer]. Had 2 years of collge.       Faith: Mormon     Review of Systems: A 12 point ROS discussed and pertinent positives are indicated in the HPI above.  All other systems are negative.  Review of Systems  Constitutional: Positive for appetite change and fatigue. Negative for chills  and fever.  HENT: Negative for nosebleeds.   Respiratory: Negative for cough and shortness of breath.   Cardiovascular: Negative for chest pain.  Gastrointestinal: Positive for nausea  (sometimes - none currently). Negative for abdominal pain, blood in stool, constipation and vomiting.  Genitourinary: Positive for difficulty urinating.  Musculoskeletal: Negative for back pain.  Skin: Negative for rash.  Neurological: Positive for dizziness (when standing too fast) and light-headedness (when standing too fast). Negative for tremors, seizures and headaches.    Vital Signs: BP (!) 164/64   Pulse 78   Temp 97.8 F (36.6 C) (Oral)   Resp 19   Ht _0  (1.727 m)   Wt 202 lb (91.6 kg)   SpO2 97%   BMI 30.71 kg/m   Physical Exam Vitals signs and nursing note reviewed.  Constitutional:      General: He is not in acute distress. HENT:     Head: Normocephalic.     Mouth/Throat:     Mouth: Mucous membranes are moist.     Pharynx: Oropharynx is clear. No oropharyngeal exudate or posterior oropharyngeal erythema.     Comments: Edentulous Cardiovascular:     Rate and Rhythm: Normal rate and regular rhythm.     Heart sounds: Murmur present.     Comments: (+) RUE AVF with palpable thrill, audible bruit Pulmonary:     Effort: Pulmonary effort is normal.     Breath sounds: Normal breath sounds.  Abdominal:     General: Bowel sounds are normal. There is no distension.     Palpations: Abdomen is soft.     Tenderness: There is no abdominal tenderness.  Skin:    General: Skin is warm.  Neurological:     Mental Status: He is alert and oriented to person, place, and time.  Psychiatric:        Mood and Affect: Mood normal.        Behavior: Behavior normal.        Thought Content: Thought content normal.        Judgment: Judgment normal.      MD Evaluation Airway: WNL Heart: WNL Abdomen: WNL Chest/ Lungs: WNL ASA  Classification: 3 Mallampati/Airway Score: Two   Imaging: Ct Head Wo Contrast  Result Date: 04/01/2019 CLINICAL DATA:  Rolled out of bed. EXAM: CT HEAD WITHOUT CONTRAST TECHNIQUE: Contiguous axial images were obtained from the base of the skull  through the vertex without intravenous contrast. COMPARISON:  CT head dated April 03, 2016. FINDINGS: Brain: No evidence of acute infarction, hemorrhage, hydrocephalus, extra-axial collection or mass lesion/mass effect. Stable atrophy and chronic microvascular ischemic changes. Unchanged remote lacunar infarcts in the left basal ganglia and thalamus. Vascular: Calcified atherosclerosis at the skullbase. No hyperdense vessel. Skull: Normal. Negative for fracture or focal lesion. Sinuses/Orbits: No acute finding. Postsurgical changes to both globes. Other: None. IMPRESSION: 1. No acute intracranial abnormality. Stable atrophy and chronic microvascular ischemic changes. Electronically Signed   By: Titus Dubin M.D.   On: 04/01/2019 14:26   Vas US Duplex Dialysis Access (avf,avg)  Result Date: 03/25/2019 DIALYSIS ACCESS Reason for Exam: Routine follow up. Access Site: Right Upper Extremity. Access Type: Brachial-cephalic AVF placed 3/54/5625. Comparison Study: No prior exam Performing Technologist: Alvia Grove RVT  Examination Guidelines: A complete evaluation includes B-mode imaging, spectral Doppler, color Doppler, and power Doppler as needed of all accessible portions of each vessel. Unilateral testing is considered an integral part of a complete examination. Limited examinations for reoccurring indications may be performed  as noted.  Findings: +--------------------+----------+-----------------+--------+ AVF                 PSV (cm/s)Flow Vol (mL/min)Comments +--------------------+----------+-----------------+--------+ Native artery inflow   351          2674                +--------------------+----------+-----------------+--------+ AVF Anastomosis        899                              +--------------------+----------+-----------------+--------+  +-------------+----------+------------+----------+-----------------------------+ OUTFLOW VEIN PSV (cm/s)  Diameter  Depth (cm)           Describe                                       (cm)                                            +-------------+----------+------------+----------+-----------------------------+ Shoulder        204        0.68       1.40       competing branch 0.20     +-------------+----------+------------+----------+-----------------------------+ Prox UA         169        0.68       1.37                                 +-------------+----------+------------+----------+-----------------------------+ Mid to Dst UA   199        0.71       0.36     competing branch 0.46 221                                                            cm/s              +-------------+----------+------------+----------+-----------------------------+ Dist UA         125        0.91       0.36                                 +-------------+----------+------------+----------+-----------------------------+ AC Fossa        240        0.77       0.95                                 +-------------+----------+------------+----------+-----------------------------+   Summary: Patent right Brachial-cephalic fistula with increased velocity at the anastamosis at the antecubital fossa.  *See table(s) above for measurements and observations.  Diagnosing physician: Servando Snare MD Electronically signed by Servando Snare MD on 03/25/2019 at 12:19:06 AM.    --------------------------------------------------------------------------------   Final     Labs:  CBC: Recent Labs    04/13/18 0926 06/14/18  02/07/19 0640  03/16/19 0924 03/23/19 1015 04/01/19 1322 04/02/19 0407  WBC  8.2 9.4  --   --   --   --   --  8.6 10.9*  HGB 10.6* 9.7*   < > 11.6*   < > 11.6* 12.0* 11.7* 12.3*  HCT 30.9* 29*  --  34.0*  --   --   --  37.9* 38.0*  PLT 192.0 220  --   --   --   --   --  173 174   < > = values in this interval not displayed.    COAGS: No results for input(s): INR, APTT in the last 8760 hours.  BMP: Recent Labs     04/13/18 0926  06/14/18 08/03/18 02/07/19 0640 04/01/19 1322 04/02/19 0407 04/02/19 0413  NA 142   < > 141 139 140 141  --  143  K 4.7  --  5.3 5.1 4.9 6.0*  --  5.9*  CL 111  --   --   --   --  116*  --  119*  CO2 21  --   --   --   --  12*  --  13*  GLUCOSE 109*  --   --   --  86 100*  --  78  BUN 51*  --  51* 52*  --  100*  --  100*  CALCIUM 8.6  --   --   --   --  7.6*  --  7.6*  CREATININE 3.86*   < > 3.4* 3.2*  --  7.70* 8.00* 7.84*  GFRNONAA  --   --   --   --   --  6* 6* 6*  GFRAA  --   --   --   --   --  7* 6* 7*   < > = values in this interval not displayed.    LIVER FUNCTION TESTS: Recent Labs    04/13/18 0926 04/02/19 0413  BILITOT 0.3 0.5  AST 14 12*  ALT 16 14  ALKPHOS 76 102  PROT 6.2 6.4*  ALBUMIN 3.8 3.3*    TUMOR MARKERS: No results for input(s): AFPTM, CEA, CA199, CHROMGRNA in the last 8760 hours.  Assessment and Plan:  85 y/o M with ESRD s/p RUE AVF creation 02/07/19, not yet been used, who presented to [facility] ED yesterday after a fall. Work up showed K+ 6.0, creatinine 7.70, BUN 100. Nephrology was consulted who plans to initiate HD during this admission. A request has been made to IR for tunneled HD catheter placement.  Will plan for tunneled HD catheter placement tomorrow in IR. I have placed orders for patient to be NPO after midnight, hold heparin after this evening's dose, AM labs.  Risks and benefits discussed with the patient including, but not limited to bleeding, infection, vascular injury, pneumothorax which may require chest tube placement, air embolism or even death.  All of the patient's questions were answered, patient is agreeable to proceed.  Consent signed and in chart.  Thank you for this interesting consult.  I greatly enjoyed meeting Rock Sobol and look forward to participating in their care.  A copy of this report was sent to the requesting provider on this date.  Electronically Signed: Joaquim Nam, PA-C  04/02/2019, 11:09 AM   I spent a total of 20 Minutes in face to face in clinical consultation, greater than 50% of which was counseling/coordinating care for tunneled HD catheter placement.

## 2019-04-02 NOTE — Progress Notes (Addendum)
[facility] Progress Note  Subjective: hasn't voided since here. Bladder scan 987 cc, foley ordered  Vitals:   04/02/19 1600 04/02/19 1615 04/02/19 1700 04/02/19 1831  BP:   (!) 134/55 140/66  Pulse: 70 71 70 74  Resp: (!) 22 16 (!) 22 18  Temp:    98.4 F (36.9 C)  TempSrc:    Oral  SpO2: 100% 96% 98% (!) 86%  Weight:      Height:        Inpatient medications: . aspirin EC  81 mg Oral Daily  . atorvastatin  40 mg Oral QHS  . calcitRIOL  0.25 mcg Oral Daily  . carvedilol  12.5 mg Oral BID  . Chlorhexidine Gluconate Cloth  6 each Topical Q0600  . erythromycin  1 application Left Eye QHS  . heparin  5,000 Units Subcutaneous Q8H  . insulin aspart  0-9 Units Subcutaneous TID WC  . Insulin Glargine  5 Units Subcutaneous QHS  . isosorbide mononitrate  30 mg Oral QHS  . pantoprazole  40 mg Oral Daily  . sertraline  150 mg Oral Daily  . sodium bicarbonate  650 mg Oral BID  . sodium zirconium cyclosilicate  10 g Oral BID  . vitamin B-12  1,000 mcg Oral Daily    acetaminophen **OR** acetaminophen, calcium carbonate (dosed in mg elemental calcium), camphor-menthol **AND** hydrOXYzine, docusate sodium, feeding supplement (NEPRO CARB STEADY), [START ON 04/03/2019] heparin, nitroGLYCERIN, ondansetron **OR** ondansetron (ZOFRAN) IV, oxymetazoline, polyvinyl alcohol, sorbitol, zolpidem    Exam: Gen alert, pleasant, elderly WM No rash, cyanosis or gangrene Sclera anicteric, throat clear  No jvd or bruits Chest clear bilat, no rales or wheezing RRR no MRG Abd soft ntnd no mass or ascites +bs GU normal male MS no joint effusions or deformity Ext no LE edema, no wounds or ulcers Neuro is alert, Ox 3 , nf RUA AVF+bruit   Home meds:  - carvedilol 12.5 bid/ furosemide 80 mg 1-2 per day  - sertraline 150 qd/ oxycodone- aceta qid prn/ gabapentin 200 hs  - aspirin 81 / isosorbide mononitrate 60 qd/ atorvastatin 40 hs  - insulin glargine 15 hs  - lansoprazole 15mg  qd/  tamsulosin 0.4 qd  - prn's/ vitamins/ supplement/ eyedrops/ creams      Assessment/ Plan: 1. Advanced CKD - w/ progression and uremic symptoms. AVF won't be ready for another 4- 6 wks. Pt admitted and will plan for initiation of HD.  Will hold lasix.  Will consult IR on Monday for [facility]. When access is in, plan HD 2-3 sessions here and set up for OP HD.  2. HTN - bp's OK, cont meds 3. DM2 on insulin  4. Volume - looks euvolemic. Hold lasix for now. 5. Urine retention - per bladder scan, foley ordered this evening       Rob Tuff Clabo 04/02/2019, 6:42 PM  Iron/TIBC/Ferritin/ %Sat    Component Value Date/Time   IRON 23 (L) 03/16/2019 0920   TIBC 225 (L) 03/16/2019 0920   FERRITIN 116 03/16/2019 0920   IRONPCTSAT 10 (L) 03/16/2019 0920   Recent Labs  Lab 04/02/19 0413  NA 143  K 5.9*  CL 119*  CO2 13*  GLUCOSE 78  BUN 100*  CREATININE 7.84*  CALCIUM 7.6*  ALBUMIN 3.3*   Recent Labs  Lab 04/02/19 0413  AST 12*  ALT 14  ALKPHOS 102  BILITOT 0.5  PROT 6.4*   Recent Labs  Lab 04/02/19 0407  WBC 10.9*  HGB 12.3*  HCT 38.0*  PLT 174

## 2019-04-02 NOTE — ED Notes (Signed)
Pt bladder scanned, per Admitting MD. Highest amount scanned was 987 ml. Brooke - RN informed.

## 2019-04-02 NOTE — ED Notes (Signed)
Pt's lunch arrived 

## 2019-04-02 NOTE — ED Notes (Signed)
Tele ordered bfast 

## 2019-04-02 NOTE — ED Notes (Signed)
Pt's CBG result was 79. Informed Brooke - Therapist, sports.

## 2019-04-02 NOTE — ED Notes (Signed)
Lunch tray ordered 

## 2019-04-02 NOTE — ED Notes (Signed)
Pt's CBG 59 - given 1 apple juice and peaches on bfast tray. Will recheck CBG again shortly

## 2019-04-02 NOTE — ED Notes (Signed)
Verbal order received by Dr. Jonnie Finner to place foley catheter given d/t urinary retention - bladder scan revealed over 980cc urine in bladder.

## 2019-04-02 NOTE — ED Notes (Signed)
Pt's CBG result was 59. Informed Anna - RN.

## 2019-04-02 NOTE — Progress Notes (Signed)
PROGRESS NOTE    Rodney Farley  [id number] DOB: [birth year] DOA: 04/01/2019 PCP: [name], MD  Brief Narrative: Rodney Farley is a 85 y.o. male with medical history significant of end-stage renal disease who had recent AV fistula placed on 02/07/2019 with plan for initiation of hemodialysis when it matures.  Also history of diabetes, complete heart block status post AICD, anemia of chronic disease, COPD, diverticulosis, BPH who presented to the ER with generalized weakness and fall from bed. He was unable to get out of bed, EMS was called, in the ED he was found to have a creatinine greater than 7, BUN of 100 and potassium of 6, nephrology was consulted, plan to place dialysis catheter and start HD   Assessment & Plan:   Progressive CKD 5, now ESRD -Admitted with creatinine of 7.9, potassium of 6, metabolic acidosis, BUN of 100 and symptoms of uremia -Nephrology consulting, IR consulted, plan for HD catheter placement tomorrow followed by dialysis -Has an AV fistula which is not mature yet -Continue sodium bicarbonate, monitor I/O, labs in a.m.  Hyperkalemia -Due to CKD 5 -Continue Lokelma, renal diet, will improve following HD  CAD/ischemic cardiomyopathy, ICD -Has multivessel CAD, treated with multiple stents in 03/2015, followed by Dr. [name] -Continue aspirin, Coreg, Imdur, statin -EF of 25 to 30% with biV ICD  COPD -Stable  Type 2 diabetes mellitus -Will decrease Lantus dose, sliding scale insulin  DVT prophylaxis: Heparin subcutaneous Code Status: Full code, discussed with patient regarding CODE STATUS again, he will discuss further with son Family Communication: No family at bedside Disposition Plan: Home pending initiation of HD  Consultants:   Renal IR   Procedures:   Antimicrobials:    Subjective: -Feels better, no specific complaints, denies any aches or injuries after fall  Objective: Vitals:   04/02/19 1030 04/02/19 1045  04/02/19 1130 04/02/19 1300  BP: (!) 156/62 (!) 153/73 (!) 122/96 (!) 146/54  Pulse: 71 69  72  Resp: 18 (!) 22 17 18   Temp:      TempSrc:      SpO2: 100% 98%  99%  Weight:      Height:       No intake or output data in the 24 hours ending 04/02/19 1307 Filed Weights   04/02/19 0718  Weight: 91.6 kg    Examination:  General exam: Appears calm and comfortable, no distress  respiratory system: Clear to auscultation Cardiovascular system: S1 & S2 heard, RRR  Gastrointestinal system: Abdomen is nondistended, soft and nontender.Normal bowel sounds heard. Central nervous system: Alert and oriented. No focal neurological deficits. Extremities: No edema Skin: Small abrasion right elbow Psychiatry: Judgement and insight appear normal. Mood & affect appropriate.     Data Reviewed:   CBC: Recent Labs  Lab 04/01/19 1322 04/02/19 0407  WBC 8.6 10.9*  NEUTROABS 6.0  --   HGB 11.7* 12.3*  HCT 37.9* 38.0*  MCV 98.4 97.4  PLT 173 [id number]   Basic Metabolic Panel: Recent Labs  Lab 04/01/19 1322 04/02/19 0407 04/02/19 0413  NA 141  --  143  K 6.0*  --  5.9*  CL 116*  --  119*  CO2 12*  --  13*  GLUCOSE 100*  --  78  BUN 100*  --  100*  CREATININE 7.70* 8.00* 7.84*  CALCIUM 7.6*  --  7.6*   GFR: Estimated Creatinine Clearance: 7.6 mL/min (A) (by C-G formula based on SCr of 7.84 mg/dL (H)). Liver Function Tests: Recent  Labs  Lab 04/02/19 0413  AST 12*  ALT 14  ALKPHOS 102  BILITOT 0.5  PROT 6.4*  ALBUMIN 3.3*   No results for input(s): LIPASE, AMYLASE in the last 168 hours. No results for input(s): AMMONIA in the last 168 hours. Coagulation Profile: No results for input(s): INR, PROTIME in the last 168 hours. Cardiac Enzymes: No results for input(s): CKTOTAL, CKMB, CKMBINDEX, TROPONINI in the last 168 hours. BNP (last 3 results) No results for input(s): PROBNP in the last 8760 hours. HbA1C: Recent Labs    04/02/19 0408  HGBA1C 5.7*   CBG: Recent Labs  Lab  04/02/19 1112 04/02/19 1234  GLUCAP 59* 79   Lipid Profile: No results for input(s): CHOL, HDL, LDLCALC, TRIG, CHOLHDL, LDLDIRECT in the last 72 hours. Thyroid Function Tests: No results for input(s): TSH, T4TOTAL, FREET4, T3FREE, THYROIDAB in the last 72 hours. Anemia Panel: No results for input(s): VITAMINB12, FOLATE, FERRITIN, TIBC, IRON, RETICCTPCT in the last 72 hours. Urine analysis:    Component Value Date/Time   COLORURINE YELLOW 03/26/2017 1809   APPEARANCEUR CLEAR 03/26/2017 1809   LABSPEC 1.013 03/26/2017 1809   PHURINE 5.0 03/26/2017 1809   GLUCOSEU NEGATIVE 03/26/2017 1809   GLUCOSEU NEGATIVE 03/25/2017 1309   HGBUR NEGATIVE 03/26/2017 1809   BILIRUBINUR NEGATIVE 03/26/2017 1809   BILIRUBINUR negative 04/04/2015 1229   BILIRUBINUR neg 02/27/2015 1359   KETONESUR NEGATIVE 03/26/2017 1809   PROTEINUR 100 (A) 03/26/2017 1809   UROBILINOGEN 0.2 03/25/2017 1309   NITRITE NEGATIVE 03/26/2017 1809   LEUKOCYTESUR NEGATIVE 03/26/2017 1809   Sepsis Labs: @LABRCNTIP (procalcitonin:4,lacticidven:4)  ) Recent Results (from the past 240 hour(s))  SARS CORONAVIRUS 2 (TAT 6-24 HRS) Nasopharyngeal Nasopharyngeal Swab     Status: None   Collection Time: 04/01/19  8:00 PM   Specimen: Nasopharyngeal Swab  Result Value Ref Range Status   SARS Coronavirus 2 NEGATIVE NEGATIVE Final    Comment: (NOTE) SARS-CoV-2 target nucleic acids are NOT DETECTED. The SARS-CoV-2 RNA is generally detectable in upper and lower respiratory specimens during the acute phase of infection. Negative results do not preclude SARS-CoV-2 infection, do not rule out co-infections with other pathogens, and should not be used as the sole basis for treatment or other patient management decisions. Negative results must be combined with clinical observations, patient history, and epidemiological information. The expected result is Negative. Fact Sheet for Patients: SugarRoll.be  Fact Sheet for Healthcare Providers: https://www.woods-mathews.com/ This test is not yet approved or cleared by the [place] FDA and  has been authorized for detection and/or diagnosis of SARS-CoV-2 by FDA under an Emergency Use Authorization (EUA). This EUA will remain  in effect (meaning this test can be used) for the duration of the COVID-19 declaration under Section 56 4(b)(1) of the Act, 21 U.S.C. section 360bbb-3(b)(1), unless the authorization is terminated or revoked sooner. Performed at [facility], [place] [address], [place], [place] [address]          Radiology Studies: Ct Head Wo Contrast  Result Date: 04/01/2019 CLINICAL DATA:  Rolled out of bed. EXAM: CT HEAD WITHOUT CONTRAST TECHNIQUE: Contiguous axial images were obtained from the base of the skull through the vertex without intravenous contrast. COMPARISON:  CT head dated April 03, 2016. FINDINGS: Brain: No evidence of acute infarction, hemorrhage, hydrocephalus, extra-axial collection or mass lesion/mass effect. Stable atrophy and chronic microvascular ischemic changes. Unchanged remote lacunar infarcts in the left basal ganglia and thalamus. Vascular: Calcified atherosclerosis at the skullbase. No hyperdense vessel. Skull: Normal.  Negative for fracture or focal lesion. Sinuses/Orbits: No acute finding. Postsurgical changes to both globes. Other: None. IMPRESSION: 1. No acute intracranial abnormality. Stable atrophy and chronic microvascular ischemic changes. Electronically Signed   By: Titus Dubin M.D.   On: 04/01/2019 14:26        Scheduled Meds: . aspirin EC  81 mg Oral Daily  . atorvastatin  40 mg Oral QHS  . calcitRIOL  0.25 mcg Oral Daily  . carvedilol  12.5 mg Oral BID  . Chlorhexidine Gluconate Cloth  6 each Topical Q0600  . erythromycin  1 application Left Eye QHS  . heparin  5,000 Units Subcutaneous Q8H  . insulin aspart  0-9 Units Subcutaneous TID WC  . Insulin Glargine   5 Units Subcutaneous QHS  . isosorbide mononitrate  60 mg Oral QHS  . pantoprazole  40 mg Oral Daily  . sertraline  150 mg Oral Daily  . sodium bicarbonate  650 mg Oral BID  . sodium zirconium cyclosilicate  10 g Oral BID  . vitamin B-12  1,000 mcg Oral Daily   Continuous Infusions:   LOS: 1 day    Time spent: 45min    Domenic Polite, MD [facility]  04/02/2019, 1:07 PM

## 2019-04-02 NOTE — ED Notes (Signed)
Dinner tray ordered.

## 2019-04-03 ENCOUNTER — Encounter (HOSPITAL_COMMUNITY): Payer: Self-pay | Admitting: Diagnostic Radiology

## 2019-04-03 ENCOUNTER — Inpatient Hospital Stay (HOSPITAL_COMMUNITY): Payer: PPO

## 2019-04-03 HISTORY — PX: IR US GUIDE VASC ACCESS RIGHT: IMG2390

## 2019-04-03 HISTORY — PX: IR FLUORO GUIDE CV LINE RIGHT: IMG2283

## 2019-04-03 LAB — GLUCOSE, CAPILLARY
Glucose-Capillary: 75 mg/dL (ref 70–99)
Glucose-Capillary: 65 mg/dL — ABNORMAL LOW (ref 70–99)
Glucose-Capillary: 94 mg/dL (ref 70–99)
Glucose-Capillary: 96 mg/dL (ref 70–99)
Glucose-Capillary: 106 mg/dL — ABNORMAL HIGH (ref 70–99)

## 2019-04-03 LAB — CBC
HCT: 35 % — ABNORMAL LOW (ref 39.0–52.0)
Hemoglobin: 11.1 g/dL — ABNORMAL LOW (ref 13.0–17.0)
MCH: 30.6 pg (ref 26.0–34.0)
MCHC: 31.7 g/dL (ref 30.0–36.0)
MCV: 96.4 fL (ref 80.0–100.0)
Platelets: 161 10*3/uL (ref 150–400)
RBC: 3.63 MIL/uL — ABNORMAL LOW (ref 4.22–5.81)
RDW: 15.6 % — ABNORMAL HIGH (ref 11.5–15.5)
WBC: 9.1 10*3/uL (ref 4.0–10.5)
nRBC: 0 % (ref 0.0–0.2)

## 2019-04-03 LAB — BASIC METABOLIC PANEL
Anion gap: 13 (ref 5–15)
BUN: 100 mg/dL — ABNORMAL HIGH (ref 8–23)
CO2: 12 mmol/L — ABNORMAL LOW (ref 22–32)
Calcium: 7.6 mg/dL — ABNORMAL LOW (ref 8.9–10.3)
Chloride: 117 mmol/L — ABNORMAL HIGH (ref 98–111)
Creatinine, Ser: 8.02 mg/dL — ABNORMAL HIGH (ref 0.61–1.24)
GFR calc Af Amer: 6 mL/min — ABNORMAL LOW (ref >60)
GFR calc non Af Amer: 6 mL/min — ABNORMAL LOW (ref >60)
Glucose, Bld: 82 mg/dL (ref 70–99)
Potassium: 4.9 mmol/L (ref 3.5–5.1)
Sodium: 142 mmol/L (ref 135–145)

## 2019-04-03 LAB — PHOSPHORUS: Phosphorus: 9.5 mg/dL — ABNORMAL HIGH (ref 2.5–4.6)

## 2019-04-03 LAB — PROTIME-INR
INR: 1.3 — ABNORMAL HIGH (ref 0.8–1.2)
Prothrombin Time: 16.4 seconds — ABNORMAL HIGH (ref 11.4–15.2)

## 2019-04-03 LAB — HEPATITIS B CORE ANTIBODY, IGM: Hep B C IgM: NEGATIVE

## 2019-04-03 LAB — HEPATITIS B SURFACE ANTIGEN: Hepatitis B Surface Ag: NEGATIVE

## 2019-04-03 LAB — HEPATITIS B SURFACE ANTIBODY,QUALITATIVE: Hep B S Ab: NONREACTIVE

## 2019-04-03 MED ORDER — SODIUM CHLORIDE 0.9 % IV SOLN
INTRAVENOUS | Status: AC | PRN
  Administered 2019-04-03: 10 mL/h via INTRAVENOUS

## 2019-04-03 MED ORDER — FENTANYL CITRATE (PF) 100 MCG/2ML IJ SOLN
INTRAMUSCULAR | Status: AC | PRN
  Administered 2019-04-03: 25 ug via INTRAVENOUS

## 2019-04-03 MED ORDER — FENTANYL CITRATE (PF) 100 MCG/2ML IJ SOLN
INTRAMUSCULAR | Status: AC
  Filled 2019-04-03: qty 2

## 2019-04-03 MED ORDER — CEFAZOLIN SODIUM-DEXTROSE 2-4 GM/100ML-% IV SOLN
2.0000 g | Freq: Once | INTRAVENOUS | Status: AC
  Administered 2019-04-03: 2 g via INTRAVENOUS
  Filled 2019-04-03: qty 100

## 2019-04-03 MED ORDER — LIDOCAINE HCL 1 % IJ SOLN
INTRAMUSCULAR | Status: AC
  Filled 2019-04-03: qty 20

## 2019-04-03 MED ORDER — GELATIN ABSORBABLE 12-7 MM EX MISC
CUTANEOUS | Status: AC
  Filled 2019-04-03: qty 1

## 2019-04-03 MED ORDER — MIDAZOLAM HCL 2 MG/2ML IJ SOLN
INTRAMUSCULAR | Status: AC
  Filled 2019-04-03: qty 2

## 2019-04-03 MED ORDER — LIDOCAINE HCL 1 % IJ SOLN
INTRAMUSCULAR | Status: AC | PRN
  Administered 2019-04-03: 10 mL

## 2019-04-03 MED ORDER — HEPARIN SODIUM (PORCINE) 1000 UNIT/ML IJ SOLN
INTRAMUSCULAR | Status: AC | PRN
  Administered 2019-04-03: 3.8 mL via INTRAVENOUS

## 2019-04-03 MED ORDER — HEPARIN SODIUM (PORCINE) 1000 UNIT/ML IJ SOLN
INTRAMUSCULAR | Status: AC
  Filled 2019-04-03: qty 1

## 2019-04-03 MED ORDER — INFLUENZA VAC A&B SA ADJ QUAD 0.5 ML IM PRSY
0.5000 mL | PREFILLED_SYRINGE | INTRAMUSCULAR | Status: AC
  Administered 2019-04-04: 10:00:00 0.5 mL via INTRAMUSCULAR
  Filled 2019-04-03: qty 0.5

## 2019-04-03 MED ORDER — MIDAZOLAM HCL 2 MG/2ML IJ SOLN
INTRAMUSCULAR | Status: AC | PRN
  Administered 2019-04-03: 1 mg via INTRAVENOUS

## 2019-04-03 NOTE — Progress Notes (Signed)
Pt rechecked CBG is WDL (94), will continue to monitor. [place], RN 04/03/2019 2:39 PM

## 2019-04-03 NOTE — Progress Notes (Signed)
Pt CBG read 65, pt asymptomatic and given a snack. Will recheck in 15 min. [place], RN 04/03/2019 1:10 PM

## 2019-04-03 NOTE — Plan of Care (Signed)
  Problem: Education: Goal: Knowledge of General Education information will improve Description: Including pain rating scale, medication(s)/side effects and non-pharmacologic comfort measures Outcome: Progressing   Problem: Health Behavior/Discharge Planning: Goal: Ability to manage health-related needs will improve Outcome: Progressing   Problem: Activity: Goal: Risk for activity intolerance will decrease Outcome: Progressing   Problem: Nutrition: Goal: Adequate nutrition will be maintained Outcome: Progressing   Problem: Elimination: Goal: Will not experience complications related to bowel motility Outcome: Progressing Goal: Will not experience complications related to urinary retention Outcome: Progressing   Problem: Safety: Goal: Ability to remain free from injury will improve Outcome: Progressing   Problem: Skin Integrity: Goal: Risk for impaired skin integrity will decrease Outcome: Progressing   

## 2019-04-03 NOTE — Progress Notes (Signed)
[facility] Progress Note    Assessment/ Plan:   1. Advanced CKD - w/ progression and uremic symptoms. AVF won't be ready for another 4- 6 wks. Pt admitted and will plan for initiation of HD.  Appreciate VIR placing [facility] 9/21. When access is in, plan HD 2-3 sessions here and set up for OP HD. Would like to give him a day off tomorrow bec he's likely to go very late tonight and then put him on 1st shift wed for HD #2.  CLIP  2. HTN - bp's OK, cont meds 3. DM2 on insulin  4. Volume - looks euvolemic. Hold lasix for now. 5. Urine retention - per bladder scan, foley ordered 9/20  Subjective:   Very tired after TDC. Denies f/c/n/v.   Objective:   BP (!) 137/51 (BP Location: Left Arm)   Pulse 70   Temp 98.6 F (37 C) (Oral)   Resp 17   Ht 5\' 8"  (1.727 m)   Wt 91.6 kg   SpO2 97%   BMI 30.71 kg/m   Intake/Output Summary (Last 24 hours) at 04/03/2019 1649 Last data filed at 04/03/2019 1500 Gross per 24 hour  Intake 500 ml  Output -  Net 500 ml   Weight change:   Physical Exam: Genalert, pleasant, elderly WM No rash, cyanosis or gangrene Sclera anicteric, throat clear  No jvd or bruits Chest clear bilat, no rales or wheezing RRR no MRG Abd soft ntnd no mass or ascites +bs GU normal male MS no joint effusions or deformity Extno LEedema, no wounds or ulcers Neuro is alert, Ox 3 , nf RUA AVF+bruit  Imaging: Ir Fluoro Guide Cv Line Right  Result Date: 04/03/2019 INDICATION: 85 year old with end-stage renal disease. Patient has a maturing right upper extremity fistula and needs a tunneled dialysis catheter for hemodialysis. EXAM: FLUOROSCOPIC AND ULTRASOUND GUIDED PLACEMENT OF A TUNNELED DIALYSIS CATHETER Physician: Stephan Minister. Anselm Pancoast, MD MEDICATIONS: Ancef 2 g; The antibiotic was administered within an appropriate time interval prior to skin puncture. ANESTHESIA/SEDATION: Versed 1.0 mg IV; Fentanyl 25 mcg IV; Moderate Sedation Time:  33 minutes The patient was  continuously monitored during the procedure by the interventional radiology nurse under my direct supervision. FLUOROSCOPY TIME:  Fluoroscopy Time: 3 minutes, 30 seconds, 20 mGy COMPLICATIONS: None immediate. PROCEDURE: Informed consent was obtained for placement of a tunneled dialysis catheter. The patient was placed supine on the interventional table. Ultrasound confirmed a patent right internal jugular vein. Ultrasound image was obtained for documentation. The right side of the neck was prepped and draped in a sterile fashion. The right neck was anesthetized with 1% lidocaine. Maximal barrier sterile technique was utilized including caps, mask, sterile gowns, sterile gloves, sterile drape, hand hygiene and skin antiseptic. A small incision was made with #11 blade scalpel. A 21 gauge needle directed into the right internal jugular vein with ultrasound guidance. A micropuncture dilator set was placed. A 23 cm tip to cuff Palindrome catheter was selected. The skin below the right clavicle was anesthetized and a small incision was made with an #11 blade scalpel. A subcutaneous tunnel was formed to the vein dermatotomy site. The catheter was brought through the tunnel. The vein dermatotomy site was dilated to accommodate a peel-away sheath. The catheter was placed through the peel-away sheath and directed into the central venous structures. The tip of the catheter was placed at the SVC and right atrium junction with fluoroscopy. Fluoroscopic images were obtained for documentation. Both lumens were found to aspirate  and flush well. The proper amount of heparin was flushed in both lumens. The vein dermatotomy site was closed using a single layer of absorbable suture and Dermabond. Gel-Foam was placed in subcutaneous tract. The catheter was secured to the skin using Prolene suture. IMPRESSION: Successful placement of a right jugular tunneled dialysis catheter using ultrasound and fluoroscopic guidance. Electronically  Signed   By: Markus Daft M.D.   On: 04/03/2019 10:29   Ir US Guide Vasc Access Right  Result Date: 04/03/2019 INDICATION: 85 year old with end-stage renal disease. Patient has a maturing right upper extremity fistula and needs a tunneled dialysis catheter for hemodialysis. EXAM: FLUOROSCOPIC AND ULTRASOUND GUIDED PLACEMENT OF A TUNNELED DIALYSIS CATHETER Physician: Stephan Minister. Anselm Pancoast, MD MEDICATIONS: Ancef 2 g; The antibiotic was administered within an appropriate time interval prior to skin puncture. ANESTHESIA/SEDATION: Versed 1.0 mg IV; Fentanyl 25 mcg IV; Moderate Sedation Time:  33 minutes The patient was continuously monitored during the procedure by the interventional radiology nurse under my direct supervision. FLUOROSCOPY TIME:  Fluoroscopy Time: 3 minutes, 30 seconds, 20 mGy COMPLICATIONS: None immediate. PROCEDURE: Informed consent was obtained for placement of a tunneled dialysis catheter. The patient was placed supine on the interventional table. Ultrasound confirmed a patent right internal jugular vein. Ultrasound image was obtained for documentation. The right side of the neck was prepped and draped in a sterile fashion. The right neck was anesthetized with 1% lidocaine. Maximal barrier sterile technique was utilized including caps, mask, sterile gowns, sterile gloves, sterile drape, hand hygiene and skin antiseptic. A small incision was made with #11 blade scalpel. A 21 gauge needle directed into the right internal jugular vein with ultrasound guidance. A micropuncture dilator set was placed. A 23 cm tip to cuff Palindrome catheter was selected. The skin below the right clavicle was anesthetized and a small incision was made with an #11 blade scalpel. A subcutaneous tunnel was formed to the vein dermatotomy site. The catheter was brought through the tunnel. The vein dermatotomy site was dilated to accommodate a peel-away sheath. The catheter was placed through the peel-away sheath and directed into the  central venous structures. The tip of the catheter was placed at the SVC and right atrium junction with fluoroscopy. Fluoroscopic images were obtained for documentation. Both lumens were found to aspirate and flush well. The proper amount of heparin was flushed in both lumens. The vein dermatotomy site was closed using a single layer of absorbable suture and Dermabond. Gel-Foam was placed in subcutaneous tract. The catheter was secured to the skin using Prolene suture. IMPRESSION: Successful placement of a right jugular tunneled dialysis catheter using ultrasound and fluoroscopic guidance. Electronically Signed   By: Markus Daft M.D.   On: 04/03/2019 10:29    Labs: BMET Recent Labs  Lab 04/01/19 1322 04/02/19 0407 04/02/19 0413 04/03/19 0446  NA 141  --  143 142  K 6.0*  --  5.9* 4.9  CL 116*  --  119* 117*  CO2 12*  --  13* 12*  GLUCOSE 100*  --  78 82  BUN 100*  --  100* 100*  CREATININE 7.70* 8.00* 7.84* 8.02*  CALCIUM 7.6*  --  7.6* 7.6*   CBC Recent Labs  Lab 04/01/19 1322 04/02/19 0407 04/03/19 0446  WBC 8.6 10.9* 9.1  NEUTROABS 6.0  --   --   HGB 11.7* 12.3* 11.1*  HCT 37.9* 38.0* 35.0*  MCV 98.4 97.4 96.4  PLT 173 174 161    Medications:    .  aspirin EC  81 mg Oral Daily  . atorvastatin  40 mg Oral QHS  . calcitRIOL  0.25 mcg Oral Daily  . carvedilol  12.5 mg Oral BID  . Chlorhexidine Gluconate Cloth  6 each Topical Q0600  . erythromycin  1 application Left Eye QHS  . fentaNYL      . gelatin adsorbable      . heparin      . [START ON 04/04/2019] influenza vaccine adjuvanted  0.5 mL Intramuscular Tomorrow-1000  . insulin aspart  0-9 Units Subcutaneous TID WC  . isosorbide mononitrate  30 mg Oral QHS  . lidocaine      . midazolam      . pantoprazole  40 mg Oral Daily  . sertraline  150 mg Oral Daily  . sodium bicarbonate  650 mg Oral BID  . sodium zirconium cyclosilicate  10 g Oral BID  . vitamin B-12  1,000 mcg Oral Daily      Otelia Santee, MD 04/03/2019,  4:49 PM

## 2019-04-03 NOTE — Progress Notes (Signed)
PROGRESS NOTE    Rodney Farley  [id number] DOB: 06-Mar-1932 DOA: 04/01/2019 PCP: [name], MD  Brief Narrative: Rodney Farley is a 85 y.o. male with medical history significant of end-stage renal disease who had recent AV fistula placed on 02/07/2019 with plan for initiation of hemodialysis when it matures.  Also history of diabetes, complete heart block status post AICD, anemia of chronic disease, COPD, diverticulosis, BPH who presented to the ER with generalized weakness and fall from bed. He was unable to get out of bed, EMS was called, in the ED he was found to have a creatinine greater than 7, BUN of 100 and potassium of 6, nephrology was consulted, plan to place dialysis catheter and start HD -9/21: HD catheter placed  Assessment & Plan:   Progressive CKD 5, now ESRD -Admitted with creatinine of 7.9, potassium of 6, metabolic acidosis, BUN of 100 and symptoms of uremia -Nephrology consulting, IR consulted,  HD catheter placed, plan for HD today -Has an AV fistula which is not mature yet -Continue sodium bicarbonate -remains stable  Hyperkalemia -Due to CKD 5 -Continue Lokelma, renal diet, improved  CAD/ischemic cardiomyopathy, ICD -Has multivessel CAD, treated with multiple stents in 03/2015, followed by Dr. [name] -Continue aspirin, Coreg, Imdur, statin -EF of 25 to 30% with biV ICD -lasix held  Urinary retention -foley placed in ER 9/20, 800cc urine retrieved   COPD -Stable  Type 2 diabetes mellitus -CBGs low, stop lantus, SSI  DVT prophylaxis: Heparin subcutaneous Code Status: Full code Family Communication: No family at bedside Disposition Plan: Home pending initiation of HD  Consultants:   Renal IR   Procedures:   Antimicrobials:    Subjective: -Feels ok, back after HD catheter placement  Objective: Vitals:   04/03/19 1006 04/03/19 1038 04/03/19 1144 04/03/19 1212  BP: (!) 99/54 (!) 129/40 (!) 118/46 (!) 120/44  Pulse:  70 69  67  Resp: 20 18    Temp:  98.3 F (36.8 C)    TempSrc:  Oral    SpO2: 98% 97%    Weight:      Height:        Intake/Output Summary (Last 24 hours) at 04/03/2019 1425 Last data filed at 04/02/2019 1453 Gross per 24 hour  Intake --  Output 825 ml  Net -825 ml   Filed Weights   04/02/19 0718  Weight: 91.6 kg    Examination:  Gen: Awake, Alert, Oriented X 3, no distress HEENT: R IJ HD catheter Lungs: clear B/L CVS: RRR,No Gallops,Rubs or new Murmurs Abd: soft, Non tender, non distended, BS present Extremities: No edema Skin: no new rashes Skin: Small abrasion right elbow Psychiatry: Judgement and insight appear normal. Mood & affect appropriate.     Data Reviewed:   CBC: Recent Labs  Lab 04/01/19 1322 04/02/19 0407 04/03/19 0446  WBC 8.6 10.9* 9.1  NEUTROABS 6.0  --   --   HGB 11.7* 12.3* 11.1*  HCT 37.9* 38.0* 35.0*  MCV 98.4 97.4 96.4  PLT 173 174 [id number]   Basic Metabolic Panel: Recent Labs  Lab 04/01/19 1322 04/02/19 0407 04/02/19 0413 04/03/19 0446  NA 141  --  143 142  K 6.0*  --  5.9* 4.9  CL 116*  --  119* 117*  CO2 12*  --  13* 12*  GLUCOSE 100*  --  78 82  BUN 100*  --  100* 100*  CREATININE 7.70* 8.00* 7.84* 8.02*  CALCIUM 7.6*  --  7.6* 7.6*  GFR: Estimated Creatinine Clearance: 7.4 mL/min (A) (by C-G formula based on SCr of 8.02 mg/dL (H)). Liver Function Tests: Recent Labs  Lab 04/02/19 0413  AST 12*  ALT 14  ALKPHOS 102  BILITOT 0.5  PROT 6.4*  ALBUMIN 3.3*   No results for input(s): LIPASE, AMYLASE in the last 168 hours. No results for input(s): AMMONIA in the last 168 hours. Coagulation Profile: Recent Labs  Lab 04/03/19 0446  INR 1.3*   Cardiac Enzymes: No results for input(s): CKTOTAL, CKMB, CKMBINDEX, TROPONINI in the last 168 hours. BNP (last 3 results) No results for input(s): PROBNP in the last 8760 hours. HbA1C: Recent Labs    04/02/19 0408  HGBA1C 5.7*   CBG: Recent Labs  Lab 04/02/19 1234  04/02/19 2111 04/03/19 0655 04/03/19 1244 04/03/19 1334  GLUCAP 79 106* 75 65* 94   Lipid Profile: No results for input(s): CHOL, HDL, LDLCALC, TRIG, CHOLHDL, LDLDIRECT in the last 72 hours. Thyroid Function Tests: No results for input(s): TSH, T4TOTAL, FREET4, T3FREE, THYROIDAB in the last 72 hours. Anemia Panel: No results for input(s): VITAMINB12, FOLATE, FERRITIN, TIBC, IRON, RETICCTPCT in the last 72 hours. Urine analysis:    Component Value Date/Time   COLORURINE YELLOW 03/26/2017 1809   APPEARANCEUR CLEAR 03/26/2017 1809   LABSPEC 1.013 03/26/2017 1809   PHURINE 5.0 03/26/2017 1809   GLUCOSEU NEGATIVE 03/26/2017 1809   GLUCOSEU NEGATIVE 03/25/2017 1309   HGBUR NEGATIVE 03/26/2017 1809   BILIRUBINUR NEGATIVE 03/26/2017 1809   BILIRUBINUR negative 04/04/2015 1229   BILIRUBINUR neg 02/27/2015 1359   KETONESUR NEGATIVE 03/26/2017 1809   PROTEINUR 100 (A) 03/26/2017 1809   UROBILINOGEN 0.2 03/25/2017 1309   NITRITE NEGATIVE 03/26/2017 1809   LEUKOCYTESUR NEGATIVE 03/26/2017 1809   Sepsis Labs: @LABRCNTIP (procalcitonin:4,lacticidven:4)  ) Recent Results (from the past 240 hour(s))  SARS CORONAVIRUS 2 (TAT 6-24 HRS) Nasopharyngeal Nasopharyngeal Swab     Status: None   Collection Time: 04/01/19  8:00 PM   Specimen: Nasopharyngeal Swab  Result Value Ref Range Status   SARS Coronavirus 2 NEGATIVE NEGATIVE Final    Comment: (NOTE) SARS-CoV-2 target nucleic acids are NOT DETECTED. The SARS-CoV-2 RNA is generally detectable in upper and lower respiratory specimens during the acute phase of infection. Negative results do not preclude SARS-CoV-2 infection, do not rule out co-infections with other pathogens, and should not be used as the sole basis for treatment or other patient management decisions. Negative results must be combined with clinical observations, patient history, and epidemiological information. The expected result is Negative. Fact Sheet for  Patients: SugarRoll.be Fact Sheet for Healthcare Providers: https://www.woods-mathews.com/ This test is not yet approved or cleared by the [place] FDA and  has been authorized for detection and/or diagnosis of SARS-CoV-2 by FDA under an Emergency Use Authorization (EUA). This EUA will remain  in effect (meaning this test can be used) for the duration of the COVID-19 declaration under Section 56 4(b)(1) of the Act, 21 U.S.C. section 360bbb-3(b)(1), unless the authorization is terminated or revoked sooner. Performed at [facility], [place] [address], [place], [place] [address]          Radiology Studies: Ir Cyndy Freeze Guide Cv Line Right  Result Date: 04/03/2019 INDICATION: 85 year old with end-stage renal disease. Patient has a maturing right upper extremity fistula and needs a tunneled dialysis catheter for hemodialysis. EXAM: FLUOROSCOPIC AND ULTRASOUND GUIDED PLACEMENT OF A TUNNELED DIALYSIS CATHETER Physician: Stephan Minister. Anselm Pancoast, MD MEDICATIONS: Ancef 2 g; The antibiotic was administered within an appropriate time  interval prior to skin puncture. ANESTHESIA/SEDATION: Versed 1.0 mg IV; Fentanyl 25 mcg IV; Moderate Sedation Time:  33 minutes The patient was continuously monitored during the procedure by the interventional radiology nurse under my direct supervision. FLUOROSCOPY TIME:  Fluoroscopy Time: 3 minutes, 30 seconds, 20 mGy COMPLICATIONS: None immediate. PROCEDURE: Informed consent was obtained for placement of a tunneled dialysis catheter. The patient was placed supine on the interventional table. Ultrasound confirmed a patent right internal jugular vein. Ultrasound image was obtained for documentation. The right side of the neck was prepped and draped in a sterile fashion. The right neck was anesthetized with 1% lidocaine. Maximal barrier sterile technique was utilized including caps, mask, sterile gowns, sterile gloves, sterile drape, hand  hygiene and skin antiseptic. A small incision was made with #11 blade scalpel. A 21 gauge needle directed into the right internal jugular vein with ultrasound guidance. A micropuncture dilator set was placed. A 23 cm tip to cuff Palindrome catheter was selected. The skin below the right clavicle was anesthetized and a small incision was made with an #11 blade scalpel. A subcutaneous tunnel was formed to the vein dermatotomy site. The catheter was brought through the tunnel. The vein dermatotomy site was dilated to accommodate a peel-away sheath. The catheter was placed through the peel-away sheath and directed into the central venous structures. The tip of the catheter was placed at the SVC and right atrium junction with fluoroscopy. Fluoroscopic images were obtained for documentation. Both lumens were found to aspirate and flush well. The proper amount of heparin was flushed in both lumens. The vein dermatotomy site was closed using a single layer of absorbable suture and Dermabond. Gel-Foam was placed in subcutaneous tract. The catheter was secured to the skin using Prolene suture. IMPRESSION: Successful placement of a right jugular tunneled dialysis catheter using ultrasound and fluoroscopic guidance. Electronically Signed   By: Markus Daft M.D.   On: 04/03/2019 10:29   Ir US Guide Vasc Access Right  Result Date: 04/03/2019 INDICATION: 85 year old with end-stage renal disease. Patient has a maturing right upper extremity fistula and needs a tunneled dialysis catheter for hemodialysis. EXAM: FLUOROSCOPIC AND ULTRASOUND GUIDED PLACEMENT OF A TUNNELED DIALYSIS CATHETER Physician: Stephan Minister. Anselm Pancoast, MD MEDICATIONS: Ancef 2 g; The antibiotic was administered within an appropriate time interval prior to skin puncture. ANESTHESIA/SEDATION: Versed 1.0 mg IV; Fentanyl 25 mcg IV; Moderate Sedation Time:  33 minutes The patient was continuously monitored during the procedure by the interventional radiology nurse under my  direct supervision. FLUOROSCOPY TIME:  Fluoroscopy Time: 3 minutes, 30 seconds, 20 mGy COMPLICATIONS: None immediate. PROCEDURE: Informed consent was obtained for placement of a tunneled dialysis catheter. The patient was placed supine on the interventional table. Ultrasound confirmed a patent right internal jugular vein. Ultrasound image was obtained for documentation. The right side of the neck was prepped and draped in a sterile fashion. The right neck was anesthetized with 1% lidocaine. Maximal barrier sterile technique was utilized including caps, mask, sterile gowns, sterile gloves, sterile drape, hand hygiene and skin antiseptic. A small incision was made with #11 blade scalpel. A 21 gauge needle directed into the right internal jugular vein with ultrasound guidance. A micropuncture dilator set was placed. A 23 cm tip to cuff Palindrome catheter was selected. The skin below the right clavicle was anesthetized and a small incision was made with an #11 blade scalpel. A subcutaneous tunnel was formed to the vein dermatotomy site. The catheter was brought through the tunnel. The vein dermatotomy  site was dilated to accommodate a peel-away sheath. The catheter was placed through the peel-away sheath and directed into the central venous structures. The tip of the catheter was placed at the SVC and right atrium junction with fluoroscopy. Fluoroscopic images were obtained for documentation. Both lumens were found to aspirate and flush well. The proper amount of heparin was flushed in both lumens. The vein dermatotomy site was closed using a single layer of absorbable suture and Dermabond. Gel-Foam was placed in subcutaneous tract. The catheter was secured to the skin using Prolene suture. IMPRESSION: Successful placement of a right jugular tunneled dialysis catheter using ultrasound and fluoroscopic guidance. Electronically Signed   By: Markus Daft M.D.   On: 04/03/2019 10:29        Scheduled Meds:  aspirin  EC  81 mg Oral Daily   atorvastatin  40 mg Oral QHS   calcitRIOL  0.25 mcg Oral Daily   carvedilol  12.5 mg Oral BID   Chlorhexidine Gluconate Cloth  6 each Topical Q0600   erythromycin  1 application Left Eye QHS   fentaNYL       gelatin adsorbable       heparin       [START ON 04/04/2019] influenza vaccine adjuvanted  0.5 mL Intramuscular Tomorrow-1000   insulin aspart  0-9 Units Subcutaneous TID WC   insulin glargine  5 Units Subcutaneous QHS   isosorbide mononitrate  30 mg Oral QHS   lidocaine       midazolam       pantoprazole  40 mg Oral Daily   sertraline  150 mg Oral Daily   sodium bicarbonate  650 mg Oral BID   sodium zirconium cyclosilicate  10 g Oral BID   vitamin B-12  1,000 mcg Oral Daily   Continuous Infusions:   LOS: 2 days    Time spent: 32min    Domenic Polite, MD [facility]  04/03/2019, 2:25 PM

## 2019-04-03 NOTE — Procedures (Signed)
Interventional Radiology Procedure:   Indications: ESRD and starting hemodialysis  Procedure: Tunneled HD catheter placement  Findings: Right jugular HD catheter.  Tip at SVC/RA junction. 23 cm tip to cuff Palindrome.  Complications: None     EBL: less than 20 ml  Plan: HD catheter is ready to use.    Marguriete Wootan R. Anselm Pancoast, MD  Pager: [phone]

## 2019-04-04 LAB — CBC
HCT: 34.2 % — ABNORMAL LOW (ref 39.0–52.0)
Hemoglobin: 11 g/dL — ABNORMAL LOW (ref 13.0–17.0)
MCH: 30.4 pg (ref 26.0–34.0)
MCHC: 32.2 g/dL (ref 30.0–36.0)
MCV: 94.5 fL (ref 80.0–100.0)
Platelets: 132 10*3/uL — ABNORMAL LOW (ref 150–400)
RBC: 3.62 MIL/uL — ABNORMAL LOW (ref 4.22–5.81)
RDW: 15.3 % (ref 11.5–15.5)
WBC: 9.5 10*3/uL (ref 4.0–10.5)
nRBC: 0 % (ref 0.0–0.2)

## 2019-04-04 LAB — CBC WITH DIFFERENTIAL/PLATELET
Abs Immature Granulocytes: 0.06 10*3/uL (ref 0.00–0.07)
Basophils Absolute: 0 10*3/uL (ref 0.0–0.1)
Basophils Relative: 0 %
Eosinophils Absolute: 0.4 10*3/uL (ref 0.0–0.5)
Eosinophils Relative: 4 %
HCT: 33.3 % — ABNORMAL LOW (ref 39.0–52.0)
Hemoglobin: 10.6 g/dL — ABNORMAL LOW (ref 13.0–17.0)
Immature Granulocytes: 1 %
Lymphocytes Relative: 11 %
Lymphs Abs: 1 10*3/uL (ref 0.7–4.0)
MCH: 31.1 pg (ref 26.0–34.0)
MCHC: 31.8 g/dL (ref 30.0–36.0)
MCV: 97.7 fL (ref 80.0–100.0)
Monocytes Absolute: 0.9 10*3/uL (ref 0.1–1.0)
Monocytes Relative: 10 %
Neutro Abs: 6.7 10*3/uL (ref 1.7–7.7)
Neutrophils Relative %: 74 %
Platelets: 164 10*3/uL (ref 150–400)
RBC: 3.41 MIL/uL — ABNORMAL LOW (ref 4.22–5.81)
RDW: 15.7 % — ABNORMAL HIGH (ref 11.5–15.5)
WBC: 9 10*3/uL (ref 4.0–10.5)
nRBC: 0 % (ref 0.0–0.2)

## 2019-04-04 LAB — GLUCOSE, CAPILLARY
Glucose-Capillary: 79 mg/dL (ref 70–99)
Glucose-Capillary: 96 mg/dL (ref 70–99)
Glucose-Capillary: 109 mg/dL — ABNORMAL HIGH (ref 70–99)
Glucose-Capillary: 114 mg/dL — ABNORMAL HIGH (ref 70–99)

## 2019-04-04 LAB — BASIC METABOLIC PANEL
Anion gap: 12 (ref 5–15)
BUN: 61 mg/dL — ABNORMAL HIGH (ref 8–23)
CO2: 20 mmol/L — ABNORMAL LOW (ref 22–32)
Calcium: 7.3 mg/dL — ABNORMAL LOW (ref 8.9–10.3)
Chloride: 110 mmol/L (ref 98–111)
Creatinine, Ser: 5.69 mg/dL — ABNORMAL HIGH (ref 0.61–1.24)
GFR calc Af Amer: 10 mL/min — ABNORMAL LOW (ref >60)
GFR calc non Af Amer: 8 mL/min — ABNORMAL LOW (ref >60)
Glucose, Bld: 82 mg/dL (ref 70–99)
Potassium: 3.3 mmol/L — ABNORMAL LOW (ref 3.5–5.1)
Sodium: 142 mmol/L (ref 135–145)

## 2019-04-04 LAB — RENAL FUNCTION PANEL
Albumin: 2.7 g/dL — ABNORMAL LOW (ref 3.5–5.0)
Anion gap: 12 (ref 5–15)
BUN: 102 mg/dL — ABNORMAL HIGH (ref 8–23)
CO2: 13 mmol/L — ABNORMAL LOW (ref 22–32)
Calcium: 7.5 mg/dL — ABNORMAL LOW (ref 8.9–10.3)
Chloride: 116 mmol/L — ABNORMAL HIGH (ref 98–111)
Creatinine, Ser: 8.43 mg/dL — ABNORMAL HIGH (ref 0.61–1.24)
GFR calc Af Amer: 6 mL/min — ABNORMAL LOW (ref >60)
GFR calc non Af Amer: 5 mL/min — ABNORMAL LOW (ref >60)
Glucose, Bld: 119 mg/dL — ABNORMAL HIGH (ref 70–99)
Phosphorus: 9.3 mg/dL — ABNORMAL HIGH (ref 2.5–4.6)
Potassium: 4.6 mmol/L (ref 3.5–5.1)
Sodium: 141 mmol/L (ref 135–145)

## 2019-04-04 MED ORDER — HEPARIN SODIUM (PORCINE) 5000 UNIT/ML IJ SOLN
5000.0000 [IU] | Freq: Three times a day (TID) | INTRAMUSCULAR | Status: DC
  Administered 2019-04-04 – 2019-04-07 (×9): 5000 [IU] via SUBCUTANEOUS
  Filled 2019-04-04 (×8): qty 1

## 2019-04-04 MED ORDER — ISOSORBIDE MONONITRATE ER 30 MG PO TB24
15.0000 mg | ORAL_TABLET | Freq: Every day | ORAL | Status: DC
  Administered 2019-04-04 – 2019-04-08 (×5): 15 mg via ORAL
  Filled 2019-04-04 (×5): qty 1

## 2019-04-04 MED ORDER — HEPARIN SODIUM (PORCINE) 1000 UNIT/ML IJ SOLN
INTRAMUSCULAR | Status: AC
  Filled 2019-04-04: qty 4

## 2019-04-04 NOTE — Progress Notes (Signed)
Attempted to call report to 61M. Nurse was busy at this time.

## 2019-04-04 NOTE — Progress Notes (Addendum)
PROGRESS NOTE    Rodney Farley  [id number] DOB: [birth year] DOA: 04/01/2019 PCP: [name], MD  Brief Narrative: Rodney Farley is a 85 y.o. male with medical history significant of end-stage renal disease who had recent AV fistula placed on 02/07/2019 with plan for initiation of hemodialysis when it matures.  Also history of diabetes, complete heart block status post AICD, anemia of chronic disease, COPD, diverticulosis, BPH who presented to the ER with generalized weakness and fall from bed. He was unable to get out of bed, EMS was called, in the ED he was found to have a creatinine greater than 7, BUN of 100 and potassium of 6, nephrology was consulted, -9/21: HD catheter placed, started HD  Assessment & Plan:   Progressive CKD 5, now ESRD -Admitted with creatinine of 7.9, potassium of 6, metabolic acidosis, BUN of 100 and symptoms of uremia -Nephrology consulting, IR consulted,  HD catheter placed, started hemodialysis 9/21 -Has an AV fistula which is not mature yet -Physical therapy evaluation -Needs to be clipped to dialysis center -Upset about duration of dialysis hopefully compliance will not be an issue  Hyperkalemia -Due to CKD 5 -Stop Lokelma, renal diet, improved with HD  CAD/ischemic cardiomyopathy, ICD -Has multivessel CAD, treated with multiple stents in 03/2015, followed by Dr. [name] -Continue aspirin, Coreg, Imdur, statin -EF of 25 to 30% with biV ICD -Lasix discontinued  Urinary retention -foley placed in ER 9/20, 800cc urine retrieved -Attempt to remove Foley catheter today, discussed with RN, since he is ESRD now may not make much urine but need to monitor with bladder scans as needed  COPD -Stable  Type 2 diabetes mellitus -CBGs low, stopped lantus, SSI -Hemoglobin A1c is 5.7 -Will not need insulin at discharge  DVT prophylaxis: Heparin subcutaneous Code Status: Full code Family Communication: No family at bedside, will update  son Disposition Plan: Home pending initiation of HD  Consultants:   Renal IR   Procedures: Tunneled HD catheter 9/21  Antimicrobials:    Subjective: -Feels okay today, appetite is better, upset about duration of dialysis  Objective: Vitals:   04/04/19 0258 04/04/19 0335 04/04/19 0415 04/04/19 0808  BP: 127/66 (!) 120/55 (!) 128/45 (!) 129/53  Pulse: 70 74 68 70  Resp: 18  18 14   Temp: 98 F (36.7 C) 98.5 F (36.9 C) 99.7 F (37.6 C) 99.7 F (37.6 C)  TempSrc: Oral Oral Oral Oral  SpO2: 100% 100% 99% 99%  Weight: 91 kg     Height:        Intake/Output Summary (Last 24 hours) at 04/04/2019 1130 Last data filed at 04/04/2019 1050 Gross per 24 hour  Intake 860 ml  Output 1500 ml  Net -640 ml   Filed Weights   04/02/19 0718 04/04/19 0000 04/04/19 0258  Weight: 91.6 kg 92 kg 91 kg    Examination:  Gen: Awake, Alert, Oriented X 3, elderly chronically ill male sitting up in bed, no distress HEENT: No JVD, right IJ HD catheter Lungs: Decreased breath sounds at bases CVS: RRR,No Gallops,Rubs or new Murmurs Abd: soft, Non tender, non distended, BS present Extremities: No edema Skin: Small abrasion right elbow Psychiatry: Judgement and insight appear normal. Mood & affect appropriate.     Data Reviewed:   CBC: Recent Labs  Lab 04/01/19 1322 04/02/19 0407 04/03/19 0446 04/04/19 0055 04/04/19 0436  WBC 8.6 10.9* 9.1 9.0 9.5  NEUTROABS 6.0  --   --  6.7  --   HGB 11.7*  12.3* 11.1* 10.6* 11.0*  HCT 37.9* 38.0* 35.0* 33.3* 34.2*  MCV 98.4 97.4 96.4 97.7 94.5  PLT 173 174 161 164 [id number]*   Basic Metabolic Panel: Recent Labs  Lab 04/01/19 1322 04/02/19 0407 04/02/19 0413 04/03/19 0446 04/03/19 1810 04/04/19 0056 04/04/19 0436  NA 141  --  143 142  --  141 142  K 6.0*  --  5.9* 4.9  --  4.6 3.3*  CL 116*  --  119* 117*  --  116* 110  CO2 12*  --  13* 12*  --  13* 20*  GLUCOSE 100*  --  78 82  --  119* 82  BUN 100*  --  100* 100*  --  102* 61*    CREATININE 7.70* 8.00* 7.84* 8.02*  --  8.43* 5.69*  CALCIUM 7.6*  --  7.6* 7.6*  --  7.5* 7.3*  PHOS  --   --   --   --  9.5* 9.3*  --    GFR: Estimated Creatinine Clearance: 10.4 mL/min (A) (by C-G formula based on SCr of 5.69 mg/dL (H)). Liver Function Tests: Recent Labs  Lab 04/02/19 0413 04/04/19 0056  AST 12*  --   ALT 14  --   ALKPHOS 102  --   BILITOT 0.5  --   PROT 6.4*  --   ALBUMIN 3.3* 2.7*   No results for input(s): LIPASE, AMYLASE in the last 168 hours. No results for input(s): AMMONIA in the last 168 hours. Coagulation Profile: Recent Labs  Lab 04/03/19 0446  INR 1.3*   Cardiac Enzymes: No results for input(s): CKTOTAL, CKMB, CKMBINDEX, TROPONINI in the last 168 hours. BNP (last 3 results) No results for input(s): PROBNP in the last 8760 hours. HbA1C: Recent Labs    04/02/19 0408  HGBA1C 5.7*   CBG: Recent Labs  Lab 04/03/19 1244 04/03/19 1334 04/03/19 1654 04/03/19 2218 04/04/19 0646  GLUCAP 65* 94 96 106* 79   Lipid Profile: No results for input(s): CHOL, HDL, LDLCALC, TRIG, CHOLHDL, LDLDIRECT in the last 72 hours. Thyroid Function Tests: No results for input(s): TSH, T4TOTAL, FREET4, T3FREE, THYROIDAB in the last 72 hours. Anemia Panel: No results for input(s): VITAMINB12, FOLATE, FERRITIN, TIBC, IRON, RETICCTPCT in the last 72 hours. Urine analysis:    Component Value Date/Time   COLORURINE YELLOW 03/26/2017 1809   APPEARANCEUR CLEAR 03/26/2017 1809   LABSPEC 1.013 03/26/2017 1809   PHURINE 5.0 03/26/2017 1809   GLUCOSEU NEGATIVE 03/26/2017 1809   GLUCOSEU NEGATIVE 03/25/2017 1309   HGBUR NEGATIVE 03/26/2017 1809   BILIRUBINUR NEGATIVE 03/26/2017 1809   BILIRUBINUR negative 04/04/2015 1229   BILIRUBINUR neg 02/27/2015 1359   KETONESUR NEGATIVE 03/26/2017 1809   PROTEINUR 100 (A) 03/26/2017 1809   UROBILINOGEN 0.2 03/25/2017 1309   NITRITE NEGATIVE 03/26/2017 1809   LEUKOCYTESUR NEGATIVE 03/26/2017 1809   Sepsis  Labs: @LABRCNTIP (procalcitonin:4,lacticidven:4)  ) Recent Results (from the past 240 hour(s))  SARS CORONAVIRUS 2 (TAT 6-24 HRS) Nasopharyngeal Nasopharyngeal Swab     Status: None   Collection Time: 04/01/19  8:00 PM   Specimen: Nasopharyngeal Swab  Result Value Ref Range Status   SARS Coronavirus 2 NEGATIVE NEGATIVE Final    Comment: (NOTE) SARS-CoV-2 target nucleic acids are NOT DETECTED. The SARS-CoV-2 RNA is generally detectable in upper and lower respiratory specimens during the acute phase of infection. Negative results do not preclude SARS-CoV-2 infection, do not rule out co-infections with other pathogens, and should not be used as the sole basis for  treatment or other patient management decisions. Negative results must be combined with clinical observations, patient history, and epidemiological information. The expected result is Negative. Fact Sheet for Patients: SugarRoll.be Fact Sheet for Healthcare Providers: https://www.woods-mathews.com/ This test is not yet approved or cleared by the [place] FDA and  has been authorized for detection and/or diagnosis of SARS-CoV-2 by FDA under an Emergency Use Authorization (EUA). This EUA will remain  in effect (meaning this test can be used) for the duration of the COVID-19 declaration under Section 56 4(b)(1) of the Act, 21 U.S.C. section 360bbb-3(b)(1), unless the authorization is terminated or revoked sooner. Performed at [facility], [place] [address], [place], [place] [address]          Radiology Studies: Ir Cyndy Freeze Guide Cv Line Right  Result Date: 04/03/2019 INDICATION: 85 year old with end-stage renal disease. Patient has a maturing right upper extremity fistula and needs a tunneled dialysis catheter for hemodialysis. EXAM: FLUOROSCOPIC AND ULTRASOUND GUIDED PLACEMENT OF A TUNNELED DIALYSIS CATHETER Physician: Stephan Minister. Anselm Pancoast, MD MEDICATIONS: Ancef 2 g; The antibiotic  was administered within an appropriate time interval prior to skin puncture. ANESTHESIA/SEDATION: Versed 1.0 mg IV; Fentanyl 25 mcg IV; Moderate Sedation Time:  33 minutes The patient was continuously monitored during the procedure by the interventional radiology nurse under my direct supervision. FLUOROSCOPY TIME:  Fluoroscopy Time: 3 minutes, 30 seconds, 20 mGy COMPLICATIONS: None immediate. PROCEDURE: Informed consent was obtained for placement of a tunneled dialysis catheter. The patient was placed supine on the interventional table. Ultrasound confirmed a patent right internal jugular vein. Ultrasound image was obtained for documentation. The right side of the neck was prepped and draped in a sterile fashion. The right neck was anesthetized with 1% lidocaine. Maximal barrier sterile technique was utilized including caps, mask, sterile gowns, sterile gloves, sterile drape, hand hygiene and skin antiseptic. A small incision was made with #11 blade scalpel. A 21 gauge needle directed into the right internal jugular vein with ultrasound guidance. A micropuncture dilator set was placed. A 23 cm tip to cuff Palindrome catheter was selected. The skin below the right clavicle was anesthetized and a small incision was made with an #11 blade scalpel. A subcutaneous tunnel was formed to the vein dermatotomy site. The catheter was brought through the tunnel. The vein dermatotomy site was dilated to accommodate a peel-away sheath. The catheter was placed through the peel-away sheath and directed into the central venous structures. The tip of the catheter was placed at the SVC and right atrium junction with fluoroscopy. Fluoroscopic images were obtained for documentation. Both lumens were found to aspirate and flush well. The proper amount of heparin was flushed in both lumens. The vein dermatotomy site was closed using a single layer of absorbable suture and Dermabond. Gel-Foam was placed in subcutaneous tract. The catheter  was secured to the skin using Prolene suture. IMPRESSION: Successful placement of a right jugular tunneled dialysis catheter using ultrasound and fluoroscopic guidance. Electronically Signed   By: Markus Daft M.D.   On: 04/03/2019 10:29   Ir US Guide Vasc Access Right  Result Date: 04/03/2019 INDICATION: 85 year old with end-stage renal disease. Patient has a maturing right upper extremity fistula and needs a tunneled dialysis catheter for hemodialysis. EXAM: FLUOROSCOPIC AND ULTRASOUND GUIDED PLACEMENT OF A TUNNELED DIALYSIS CATHETER Physician: Stephan Minister. Anselm Pancoast, MD MEDICATIONS: Ancef 2 g; The antibiotic was administered within an appropriate time interval prior to skin puncture. ANESTHESIA/SEDATION: Versed 1.0 mg IV; Fentanyl 25 mcg IV; Moderate Sedation  Time:  33 minutes The patient was continuously monitored during the procedure by the interventional radiology nurse under my direct supervision. FLUOROSCOPY TIME:  Fluoroscopy Time: 3 minutes, 30 seconds, 20 mGy COMPLICATIONS: None immediate. PROCEDURE: Informed consent was obtained for placement of a tunneled dialysis catheter. The patient was placed supine on the interventional table. Ultrasound confirmed a patent right internal jugular vein. Ultrasound image was obtained for documentation. The right side of the neck was prepped and draped in a sterile fashion. The right neck was anesthetized with 1% lidocaine. Maximal barrier sterile technique was utilized including caps, mask, sterile gowns, sterile gloves, sterile drape, hand hygiene and skin antiseptic. A small incision was made with #11 blade scalpel. A 21 gauge needle directed into the right internal jugular vein with ultrasound guidance. A micropuncture dilator set was placed. A 23 cm tip to cuff Palindrome catheter was selected. The skin below the right clavicle was anesthetized and a small incision was made with an #11 blade scalpel. A subcutaneous tunnel was formed to the vein dermatotomy site. The  catheter was brought through the tunnel. The vein dermatotomy site was dilated to accommodate a peel-away sheath. The catheter was placed through the peel-away sheath and directed into the central venous structures. The tip of the catheter was placed at the SVC and right atrium junction with fluoroscopy. Fluoroscopic images were obtained for documentation. Both lumens were found to aspirate and flush well. The proper amount of heparin was flushed in both lumens. The vein dermatotomy site was closed using a single layer of absorbable suture and Dermabond. Gel-Foam was placed in subcutaneous tract. The catheter was secured to the skin using Prolene suture. IMPRESSION: Successful placement of a right jugular tunneled dialysis catheter using ultrasound and fluoroscopic guidance. Electronically Signed   By: Markus Daft M.D.   On: 04/03/2019 10:29        Scheduled Meds:  aspirin EC  81 mg Oral Daily   atorvastatin  40 mg Oral QHS   calcitRIOL  0.25 mcg Oral Daily   carvedilol  12.5 mg Oral BID   Chlorhexidine Gluconate Cloth  6 each Topical Q0600   erythromycin  1 application Left Eye QHS   heparin       heparin injection (subcutaneous)  5,000 Units Subcutaneous Q8H   insulin aspart  0-9 Units Subcutaneous TID WC   isosorbide mononitrate  15 mg Oral QHS   pantoprazole  40 mg Oral Daily   sertraline  150 mg Oral Daily   sodium bicarbonate  650 mg Oral BID   vitamin B-12  1,000 mcg Oral Daily   Continuous Infusions:   LOS: 3 days    Time spent: 76min    Domenic Polite, MD [facility]  04/04/2019, 11:30 AM

## 2019-04-04 NOTE — Progress Notes (Signed)
Renal Navigator met with patient at bedside to discuss OP HD referral. Patient states he would like to go to HD for 30 minute sessions and if this is not possible, he is not interested in treatment. He seemed to be joking at first, but it became evident that he was not joking. Renal Navigator explained that the average treatment is 4 hours 3 times per week and that it is imperative that he go to all scheduled appointments. He said this would not work for him and that he will not be successful. He said, "there has to be another option." Navigator mentioned Home Therapy and what these entail, but patient did not want to discuss natural supports or who he lives with. He states that he is not interested in Home Therapy as he would not be able to ensure a sterile environment. He understands that the alternative to HD is death and we had an open conversation about his option to choose this. Renal Navigator explained that we could have Palliative Medicine meet with him and his family if he would like to further discuss option to not start dialysis. He told Renal Navigator that his first HD session was "absolutely appalling." He asked why he could not have 30 minute HD sessions and why his blood could not be cleaned in this amount of time. Renal Navigator suggested having Nephrologist meet with patient again to answer his questions, encouraging informed decision making. Dr. Augustin Coupe met with patient and Navigator to answer patient's questions. Patient seemed satisfied with his questions. Patient states he wants to "take another stab at it (HD)" tomorrow and "I'm going to do it." He did not want Navigator to make OP HD referral until he has another session, however. Renal Navigator encouraged patient to discuss how he is feeling with his son, whom he states he has not talked to about HD.  Navigator agreed to meet with patient again tomorrow to follow up on OP HD referral.   Alphonzo Cruise, LCSW Renal Navigator

## 2019-04-04 NOTE — Progress Notes (Signed)
Patient back on unit 5N11 from HD.

## 2019-04-04 NOTE — Progress Notes (Signed)
Patient to HD 

## 2019-04-04 NOTE — Evaluation (Signed)
Physical Therapy Evaluation Patient Details Name: Rodney Farley MRN: [id number] DOB: [birth year] Today's Date: 04/04/2019   History of Present Illness  Pt is an 85 y/o male admitted secondary to increased weakness and fall. Found to be in ESRD is s/p R IJ catheter placement. Pt started on HD on 9/21. PMH includes ischemic cariomyopathy, s/p pacemaker, DM, HTN, CKD, COPD, CAD, and s/p AV fistula placement.   Clinical Impression  Pt admitted secondary to problem above with deficits below. Pt requiring min guard A for mobility tasks using RW. Did note some memory deficits as pt would lose train of thought easily. Pt also with difficulty dual tasking and had to stop to answer questions while performing gait tasks. Feel pt would benefit from 24/7 assist initially upon d/c to ensure safety at home. Will continue to follow acutely to maximize functional mobility independence and safety.     Follow Up Recommendations Home health PT;Supervision/Assistance - 24 hour    Equipment Recommendations  None recommended by PT    Recommendations for Other Services       Precautions / Restrictions Precautions Precautions: Fall Restrictions Weight Bearing Restrictions: No      Mobility  Bed Mobility Overal bed mobility: Needs Assistance Bed Mobility: Sit to Supine       Sit to supine: Min assist   General bed mobility comments: Min A for LE assist for return to supine.   Transfers Overall transfer level: Needs assistance Equipment used: Rolling walker (2 wheeled) Transfers: Sit to/from Stand Sit to Stand: Min guard         General transfer comment: Min guard for safety. Cues for safe hand placement.   Ambulation/Gait Ambulation/Gait assistance: Min guard Gait Distance (Feet): 150 Feet Assistive device: Rolling walker (2 wheeled) Gait Pattern/deviations: Step-through pattern;Decreased stride length Gait velocity: Decreased   General Gait Details: Slow, mildly unsteady gait. Pt  with difficulty dual tasking. When asked questions, had to stop to answer vs. continuing gait. Pt would also leave RW behind occasionally.   Stairs            Wheelchair Mobility    Modified Rankin (Stroke Patients Only)       Balance Overall balance assessment: Needs assistance Sitting-balance support: Feet supported;No upper extremity supported Sitting balance-Leahy Scale: Fair     Standing balance support: Bilateral upper extremity supported;During functional activity Standing balance-Leahy Scale: Poor Standing balance comment: Reliant on BUE support                              Pertinent Vitals/Pain Pain Assessment: No/denies pain    Home Living Family/patient expects to be discharged to:: Private residence Living Arrangements: Spouse/significant other Available Help at Discharge: Family;Available PRN/intermittently Type of Home: House Home Access: Level entry     Home Layout: One level Home Equipment: Walker - 2 wheels;Cane - single point      Prior Function Level of Independence: Independent with assistive device(s)         Comments: Uses cane for ambulation      Hand Dominance        Extremity/Trunk Assessment   Upper Extremity Assessment Upper Extremity Assessment: Defer to OT evaluation    Lower Extremity Assessment Lower Extremity Assessment: Generalized weakness    Cervical / Trunk Assessment Cervical / Trunk Assessment: Normal  Communication   Communication: HOH  Cognition Arousal/Alertness: Awake/alert Behavior During Therapy: WFL for tasks assessed/performed Overall Cognitive Status: No family/caregiver present  to determine baseline cognitive functioning                                 General Comments: Questionable cognitive deficits. Pt tended to lose his train of thought and had some memory deficits.       General Comments      Exercises     Assessment/Plan    PT Assessment Patient needs  continued PT services  PT Problem List Decreased strength;Decreased balance;Decreased mobility;Decreased cognition;Decreased knowledge of use of DME;Decreased knowledge of precautions       PT Treatment Interventions Gait training;DME instruction;Functional mobility training;Therapeutic activities;Therapeutic exercise;Balance training;Patient/family education;Cognitive remediation    PT Goals (Current goals can be found in the Care Plan section)  Acute Rehab PT Goals Patient Stated Goal: to get stronger PT Goal Formulation: With patient Time For Goal Achievement: 04/18/19 Potential to Achieve Goals: Good    Frequency Min 3X/week   Barriers to discharge        Co-evaluation               AM-PAC PT "6 Clicks" Mobility  Outcome Measure Help needed turning from your back to your side while in a flat bed without using bedrails?: A Little Help needed moving from lying on your back to sitting on the side of a flat bed without using bedrails?: A Little Help needed moving to and from a bed to a chair (including a wheelchair)?: A Little Help needed standing up from a chair using your arms (e.g., wheelchair or bedside chair)?: A Little Help needed to walk in hospital room?: A Little Help needed climbing 3-5 steps with a railing? : A Lot 6 Click Score: 17    End of Session Equipment Utilized During Treatment: Gait belt Activity Tolerance: Patient tolerated treatment well Patient left: in bed;with call bell/phone within reach;with bed alarm set Nurse Communication: Mobility status PT Visit Diagnosis: Unsteadiness on feet (R26.81);Muscle weakness (generalized) (M62.81)    Time: 1346-1410 PT Time Calculation (min) (ACUTE ONLY): 24 min   Charges:   PT Evaluation $PT Eval Moderate Complexity: 1 Mod PT Treatments $Gait Training: 8-22 mins        Leighton Ruff, PT, DPT  Acute Rehabilitation Services  Pager: [phone] Office: [phone]   Rudean Hitt 04/04/2019, 2:24 PM

## 2019-04-04 NOTE — Progress Notes (Signed)
[facility] Progress Note    Assessment/ Plan:   1. Advanced CKD - w/ progression and uremic symptoms. AVF won't be ready for another 4- 6 wks.Pt admitted and will plan forinitiation of HD.   Appreciate VIR placing [facility] 9/21.   HD #1 9/21. I gave him a day off and will perform HD #2 9/23; he didn't do well with HD #1 but wishes to try again before giving up on RRT.  Being CLIP'd as well.  2. HTN - bp's OK, cont meds 3. DM2 on insulin  4. Volume - looks euvolemic. Hold lasix for now. 5. Urine retention - per bladder scan, foley ordered 9/20  Subjective:   Very tired after [facility] and HD didn't go well yest but he tolerated full treatment.. Denies f/c/n/v.   Objective:   BP (!) 130/53 (BP Location: Left Arm)   Pulse 67   Temp 98.6 F (37 C) (Oral)   Resp 17   Ht 5\' 8"  (1.727 m)   Wt 91 kg   SpO2 94%   BMI 30.50 kg/m   Intake/Output Summary (Last 24 hours) at 04/04/2019 1606 Last data filed at 04/04/2019 1050 Gross per 24 hour  Intake 360 ml  Output 1500 ml  Net -1140 ml   Weight change: 0.373 kg  Physical Exam: Genalert, pleasant, elderly WM No rash, cyanosis or gangrene Sclera anicteric, throat clear  No jvd or bruits Chest clear bilat, no rales or wheezing RRR no MRG Abd soft ntnd no mass or ascites +bs GU normal male MS no joint effusions or deformity Extno LEedema, no wounds or ulcers Neuro is alert, Ox 3 , nf RUA AVF+bruit  Imaging: Ir Fluoro Guide Cv Line Right  Result Date: 04/03/2019 INDICATION: 85 year old with end-stage renal disease. Patient has a maturing right upper extremity fistula and needs a tunneled dialysis catheter for hemodialysis. EXAM: FLUOROSCOPIC AND ULTRASOUND GUIDED PLACEMENT OF A TUNNELED DIALYSIS CATHETER Physician: Stephan Minister. Anselm Pancoast, MD MEDICATIONS: Ancef 2 g; The antibiotic was administered within an appropriate time interval prior to skin puncture. ANESTHESIA/SEDATION: Versed 1.0 mg IV; Fentanyl 25 mcg IV; Moderate  Sedation Time:  33 minutes The patient was continuously monitored during the procedure by the interventional radiology nurse under my direct supervision. FLUOROSCOPY TIME:  Fluoroscopy Time: 3 minutes, 30 seconds, 20 mGy COMPLICATIONS: None immediate. PROCEDURE: Informed consent was obtained for placement of a tunneled dialysis catheter. The patient was placed supine on the interventional table. Ultrasound confirmed a patent right internal jugular vein. Ultrasound image was obtained for documentation. The right side of the neck was prepped and draped in a sterile fashion. The right neck was anesthetized with 1% lidocaine. Maximal barrier sterile technique was utilized including caps, mask, sterile gowns, sterile gloves, sterile drape, hand hygiene and skin antiseptic. A small incision was made with #11 blade scalpel. A 21 gauge needle directed into the right internal jugular vein with ultrasound guidance. A micropuncture dilator set was placed. A 23 cm tip to cuff Palindrome catheter was selected. The skin below the right clavicle was anesthetized and a small incision was made with an #11 blade scalpel. A subcutaneous tunnel was formed to the vein dermatotomy site. The catheter was brought through the tunnel. The vein dermatotomy site was dilated to accommodate a peel-away sheath. The catheter was placed through the peel-away sheath and directed into the central venous structures. The tip of the catheter was placed at the SVC and right atrium junction with fluoroscopy. Fluoroscopic images were obtained for documentation.  Both lumens were found to aspirate and flush well. The proper amount of heparin was flushed in both lumens. The vein dermatotomy site was closed using a single layer of absorbable suture and Dermabond. Gel-Foam was placed in subcutaneous tract. The catheter was secured to the skin using Prolene suture. IMPRESSION: Successful placement of a right jugular tunneled dialysis catheter using ultrasound  and fluoroscopic guidance. Electronically Signed   By: Markus Daft M.D.   On: 04/03/2019 10:29   Ir US Guide Vasc Access Right  Result Date: 04/03/2019 INDICATION: 85 year old with end-stage renal disease. Patient has a maturing right upper extremity fistula and needs a tunneled dialysis catheter for hemodialysis. EXAM: FLUOROSCOPIC AND ULTRASOUND GUIDED PLACEMENT OF A TUNNELED DIALYSIS CATHETER Physician: Stephan Minister. Anselm Pancoast, MD MEDICATIONS: Ancef 2 g; The antibiotic was administered within an appropriate time interval prior to skin puncture. ANESTHESIA/SEDATION: Versed 1.0 mg IV; Fentanyl 25 mcg IV; Moderate Sedation Time:  33 minutes The patient was continuously monitored during the procedure by the interventional radiology nurse under my direct supervision. FLUOROSCOPY TIME:  Fluoroscopy Time: 3 minutes, 30 seconds, 20 mGy COMPLICATIONS: None immediate. PROCEDURE: Informed consent was obtained for placement of a tunneled dialysis catheter. The patient was placed supine on the interventional table. Ultrasound confirmed a patent right internal jugular vein. Ultrasound image was obtained for documentation. The right side of the neck was prepped and draped in a sterile fashion. The right neck was anesthetized with 1% lidocaine. Maximal barrier sterile technique was utilized including caps, mask, sterile gowns, sterile gloves, sterile drape, hand hygiene and skin antiseptic. A small incision was made with #11 blade scalpel. A 21 gauge needle directed into the right internal jugular vein with ultrasound guidance. A micropuncture dilator set was placed. A 23 cm tip to cuff Palindrome catheter was selected. The skin below the right clavicle was anesthetized and a small incision was made with an #11 blade scalpel. A subcutaneous tunnel was formed to the vein dermatotomy site. The catheter was brought through the tunnel. The vein dermatotomy site was dilated to accommodate a peel-away sheath. The catheter was placed through  the peel-away sheath and directed into the central venous structures. The tip of the catheter was placed at the SVC and right atrium junction with fluoroscopy. Fluoroscopic images were obtained for documentation. Both lumens were found to aspirate and flush well. The proper amount of heparin was flushed in both lumens. The vein dermatotomy site was closed using a single layer of absorbable suture and Dermabond. Gel-Foam was placed in subcutaneous tract. The catheter was secured to the skin using Prolene suture. IMPRESSION: Successful placement of a right jugular tunneled dialysis catheter using ultrasound and fluoroscopic guidance. Electronically Signed   By: Markus Daft M.D.   On: 04/03/2019 10:29    Labs: BMET Recent Labs  Lab 04/01/19 1322 04/02/19 0407 04/02/19 0413 04/03/19 0446 04/03/19 1810 04/04/19 0056 04/04/19 0436  NA 141  --  143 142  --  141 142  K 6.0*  --  5.9* 4.9  --  4.6 3.3*  CL 116*  --  119* 117*  --  116* 110  CO2 12*  --  13* 12*  --  13* 20*  GLUCOSE 100*  --  78 82  --  119* 82  BUN 100*  --  100* 100*  --  102* 61*  CREATININE 7.70* 8.00* 7.84* 8.02*  --  8.43* 5.69*  CALCIUM 7.6*  --  7.6* 7.6*  --  7.5* 7.3*  PHOS  --   --   --   --  9.5* 9.3*  --    CBC Recent Labs  Lab 04/01/19 1322 04/02/19 0407 04/03/19 0446 04/04/19 0055 04/04/19 0436  WBC 8.6 10.9* 9.1 9.0 9.5  NEUTROABS 6.0  --   --  6.7  --   HGB 11.7* 12.3* 11.1* 10.6* 11.0*  HCT 37.9* 38.0* 35.0* 33.3* 34.2*  MCV 98.4 97.4 96.4 97.7 94.5  PLT 173 174 161 164 132*    Medications:    . aspirin EC  81 mg Oral Daily  . atorvastatin  40 mg Oral QHS  . calcitRIOL  0.25 mcg Oral Daily  . carvedilol  12.5 mg Oral BID  . Chlorhexidine Gluconate Cloth  6 each Topical Q0600  . erythromycin  1 application Left Eye QHS  . heparin injection (subcutaneous)  5,000 Units Subcutaneous Q8H  . insulin aspart  0-9 Units Subcutaneous TID WC  . isosorbide mononitrate  15 mg Oral QHS  . pantoprazole  40  mg Oral Daily  . sertraline  150 mg Oral Daily  . sodium bicarbonate  650 mg Oral BID  . vitamin B-12  1,000 mcg Oral Daily      Otelia Santee, MD 04/04/2019, 4:06 PM

## 2019-04-04 NOTE — Plan of Care (Signed)

## 2019-04-05 LAB — BASIC METABOLIC PANEL
Anion gap: 12 (ref 5–15)
BUN: 76 mg/dL — ABNORMAL HIGH (ref 8–23)
CO2: 19 mmol/L — ABNORMAL LOW (ref 22–32)
Calcium: 7.4 mg/dL — ABNORMAL LOW (ref 8.9–10.3)
Chloride: 112 mmol/L — ABNORMAL HIGH (ref 98–111)
Creatinine, Ser: 7.16 mg/dL — ABNORMAL HIGH (ref 0.61–1.24)
GFR calc Af Amer: 7 mL/min — ABNORMAL LOW (ref >60)
GFR calc non Af Amer: 6 mL/min — ABNORMAL LOW (ref >60)
Glucose, Bld: 96 mg/dL (ref 70–99)
Potassium: 3.2 mmol/L — ABNORMAL LOW (ref 3.5–5.1)
Sodium: 143 mmol/L (ref 135–145)

## 2019-04-05 LAB — CBC
HCT: 32.7 % — ABNORMAL LOW (ref 39.0–52.0)
Hemoglobin: 10.7 g/dL — ABNORMAL LOW (ref 13.0–17.0)
MCH: 30.9 pg (ref 26.0–34.0)
MCHC: 32.7 g/dL (ref 30.0–36.0)
MCV: 94.5 fL (ref 80.0–100.0)
Platelets: 131 10*3/uL — ABNORMAL LOW (ref 150–400)
RBC: 3.46 MIL/uL — ABNORMAL LOW (ref 4.22–5.81)
RDW: 15.2 % (ref 11.5–15.5)
WBC: 9.4 10*3/uL (ref 4.0–10.5)
nRBC: 0 % (ref 0.0–0.2)

## 2019-04-05 LAB — GLUCOSE, CAPILLARY
Glucose-Capillary: 88 mg/dL (ref 70–99)
Glucose-Capillary: 121 mg/dL — ABNORMAL HIGH (ref 70–99)
Glucose-Capillary: 120 mg/dL — ABNORMAL HIGH (ref 70–99)

## 2019-04-05 MED ORDER — HEPARIN SODIUM (PORCINE) 1000 UNIT/ML IJ SOLN
INTRAMUSCULAR | Status: AC
  Filled 2019-04-05: qty 4

## 2019-04-05 MED ORDER — SEVELAMER CARBONATE 800 MG PO TABS
1600.0000 mg | ORAL_TABLET | Freq: Three times a day (TID) | ORAL | Status: DC
  Administered 2019-04-05 – 2019-04-09 (×9): 1600 mg via ORAL
  Filled 2019-04-05 (×12): qty 2

## 2019-04-05 NOTE — Progress Notes (Signed)
PROGRESS NOTE    Rodney Farley  [id number] DOB: 1932/04/27 DOA: 04/01/2019 PCP: [name], MD   Brief Narrative: 85 year old with past medical history significant for end-stage renal disease on hemodialysis, who had recent AV fistula placed on 02/07/2019 with plan for initiation of hemodialysis when its matures.  Patient also has a history of diabetes, complete heart block status post AICD, anemia of chronic disease, COPD, diverticulosis, BPH who presents to the ER with generalized weakness and fall from bed. He was unable to get out of bed, EMS was called, in the ED he was found to have a creatinine greater than 7 BUN of 100 and potassium of 6.  Nephrology was consulted. Patient was started on hemodialysis on 9/21.  He underwent hemodialysis catheter placement.    Assessment & Plan:   Principal Problem:   ESRD (end stage renal disease) ([place]) Active Problems:   Ischemic cardiomyopathy   Obesity   Pacemaker   Depression, major, recurrent, moderate (HCC)   Controlled type 2 diabetes mellitus with stage 4 chronic kidney disease (HCC)   HTN (hypertension)   GERD (gastroesophageal reflux disease)   Chronic kidney disease (CKD), stage IV (severe) (HCC)   Diabetic peripheral neuropathy (HCC)   COPD (chronic obstructive pulmonary disease) (HCC)   Hyperlipidemia   Hyperkalemia, diminished renal excretion   1-Progressive chronic kidney disease stage V now end-stage renal disease new on hemodialysis -Patient present with uremic symptoms, BUN 100 creatinine 7.9. -Nephrology was consulted.  Had hemodialysis placed on 9/21. -He has a AV fistula which is not mature yet. -He is going to give a trial to hemodialysis today.  2-Hyperkalemia: Due to chronic kidney disease.  Receive Lokelma.  Resolved  3-Coronary artery disease/ischemic cardiomyopathy, ICD: Has multiple vessels coronary artery disease treated with multiple stent in 2016, followed by Dr. [name]. Continue with  aspirin, Coreg, Imdur and statin. Ejection fraction 25 to 30% with biventricular ICD. Lasix has been discontinued now that he is on hemodialysis  4-Urine retention: Foley placed in the ER 9/20, 800 cc urine retention Need to monitor with bladder scan Foley was removed.  5-COPD: Stable  Diabetes type 2: Lantus is stopped due to hypoglycemia. Monitor CBG.     Estimated body mass index is 30.5 kg/m as calculated from the following:   Height as of this encounter: 5\' 8"  (1.727 m).   Weight as of this encounter: 91 kg.   DVT prophylaxis: Heparin Code Status: Full code Family Communication: No family at bedside Disposition Plan: For hemodialysis today awaiting hemodialysis comply clip Consultants:   Nephrology  IR  Procedures:   Hemodialysis tunnel catheter 9/21  Antimicrobials:    Subjective: He is feeling well today, denies shortness of breath.  He is more open to hemodialysis.  He is thankful that there is a procedure that will allow him to enjoy his family.  Objective: Vitals:   04/04/19 1751 04/04/19 2217 04/05/19 0417 04/05/19 0423  BP: (!) 149/60 (!) 148/48 (!) 138/58   Pulse: 69 73 70   Resp: 18 18 18    Temp: 98.3 F (36.8 C) 97.8 F (36.6 C) 97.9 F (36.6 C)   TempSrc: Oral Oral Oral   SpO2: 98% 98% 98%   Weight:  91 kg  91 kg  Height:        Intake/Output Summary (Last 24 hours) at 04/05/2019 1501 Last data filed at 04/05/2019 1258 Gross per 24 hour  Intake 840 ml  Output 150 ml  Net 690 ml  Filed Weights   04/04/19 0258 04/04/19 2217 04/05/19 0423  Weight: 91 kg 91 kg 91 kg    Examination:  General exam: Appears calm and comfortable  Respiratory system: Clear to auscultation. Respiratory effort normal. Cardiovascular system: S1 & S2 heard, RRR. No JVD, murmurs, rubs, gallops or clicks. No pedal edema. Gastrointestinal system: Abdomen is nondistended, soft and nontender. No organomegaly or masses felt. Normal bowel sounds heard. Central  nervous system: Alert and oriented. Extremities: Symmetric 5 x 5 power. Skin: No rashes, lesions or ulcers   Data Reviewed: I have personally reviewed following labs and imaging studies  CBC: Recent Labs  Lab 04/01/19 1322 04/02/19 0407 04/03/19 0446 04/04/19 0055 04/04/19 0436 04/05/19 0715  WBC 8.6 10.9* 9.1 9.0 9.5 9.4  NEUTROABS 6.0  --   --  6.7  --   --   HGB 11.7* 12.3* 11.1* 10.6* 11.0* 10.7*  HCT 37.9* 38.0* 35.0* 33.3* 34.2* 32.7*  MCV 98.4 97.4 96.4 97.7 94.5 94.5  PLT 173 174 161 164 132* [id number]*   Basic Metabolic Panel: Recent Labs  Lab 04/02/19 0413 04/03/19 0446 04/03/19 1810 04/04/19 0056 04/04/19 0436 04/05/19 0715  NA 143 142  --  141 142 143  K 5.9* 4.9  --  4.6 3.3* 3.2*  CL 119* 117*  --  116* 110 112*  CO2 13* 12*  --  13* 20* 19*  GLUCOSE 78 82  --  119* 82 96  BUN 100* 100*  --  102* 61* 76*  CREATININE 7.84* 8.02*  --  8.43* 5.69* 7.16*  CALCIUM 7.6* 7.6*  --  7.5* 7.3* 7.4*  PHOS  --   --  9.5* 9.3*  --   --    GFR: Estimated Creatinine Clearance: 8.3 mL/min (A) (by C-G formula based on SCr of 7.16 mg/dL (H)). Liver Function Tests: Recent Labs  Lab 04/02/19 0413 04/04/19 0056  AST 12*  --   ALT 14  --   ALKPHOS 102  --   BILITOT 0.5  --   PROT 6.4*  --   ALBUMIN 3.3* 2.7*   No results for input(s): LIPASE, AMYLASE in the last 168 hours. No results for input(s): AMMONIA in the last 168 hours. Coagulation Profile: Recent Labs  Lab 04/03/19 0446  INR 1.3*   Cardiac Enzymes: No results for input(s): CKTOTAL, CKMB, CKMBINDEX, TROPONINI in the last 168 hours. BNP (last 3 results) No results for input(s): PROBNP in the last 8760 hours. HbA1C: No results for input(s): HGBA1C in the last 72 hours. CBG: Recent Labs  Lab 04/04/19 1153 04/04/19 1750 04/04/19 2217 04/05/19 0720 04/05/19 1109  GLUCAP 96 109* 114* 88 121*   Lipid Profile: No results for input(s): CHOL, HDL, LDLCALC, TRIG, CHOLHDL, LDLDIRECT in the last 72 hours.  Thyroid Function Tests: No results for input(s): TSH, T4TOTAL, FREET4, T3FREE, THYROIDAB in the last 72 hours. Anemia Panel: No results for input(s): VITAMINB12, FOLATE, FERRITIN, TIBC, IRON, RETICCTPCT in the last 72 hours. Sepsis Labs: No results for input(s): PROCALCITON, LATICACIDVEN in the last 168 hours.  Recent Results (from the past 240 hour(s))  SARS CORONAVIRUS 2 (TAT 6-24 HRS) Nasopharyngeal Nasopharyngeal Swab     Status: None   Collection Time: 04/01/19  8:00 PM   Specimen: Nasopharyngeal Swab  Result Value Ref Range Status   SARS Coronavirus 2 NEGATIVE NEGATIVE Final    Comment: (NOTE) SARS-CoV-2 target nucleic acids are NOT DETECTED. The SARS-CoV-2 RNA is generally detectable in upper and lower respiratory specimens during  the acute phase of infection. Negative results do not preclude SARS-CoV-2 infection, do not rule out co-infections with other pathogens, and should not be used as the sole basis for treatment or other patient management decisions. Negative results must be combined with clinical observations, patient history, and epidemiological information. The expected result is Negative. Fact Sheet for Patients: SugarRoll.be Fact Sheet for Healthcare Providers: https://www.woods-mathews.com/ This test is not yet approved or cleared by the [place] FDA and  has been authorized for detection and/or diagnosis of SARS-CoV-2 by FDA under an Emergency Use Authorization (EUA). This EUA will remain  in effect (meaning this test can be used) for the duration of the COVID-19 declaration under Section 56 4(b)(1) of the Act, 21 U.S.C. section 360bbb-3(b)(1), unless the authorization is terminated or revoked sooner. Performed at [facility], [place] [address] [place], [place] [address]          Radiology Studies: No results found.      Scheduled Meds: . aspirin EC  81 mg Oral Daily  . atorvastatin  40 mg  Oral QHS  . calcitRIOL  0.25 mcg Oral Daily  . carvedilol  12.5 mg Oral BID  . Chlorhexidine Gluconate Cloth  6 each Topical Q0600  . erythromycin  1 application Left Eye QHS  . heparin injection (subcutaneous)  5,000 Units Subcutaneous Q8H  . insulin aspart  0-9 Units Subcutaneous TID WC  . isosorbide mononitrate  15 mg Oral QHS  . pantoprazole  40 mg Oral Daily  . sertraline  150 mg Oral Daily  . sevelamer carbonate  1,600 mg Oral TID WC  . vitamin B-12  1,000 mcg Oral Daily   Continuous Infusions:   LOS: 4 days    Time spent: 35 minutes.     Elmarie Shiley, MD [facility] Pager [phone]  If 7PM-7AM, please contact night-coverage [email or web address] Password [facility] 04/05/2019, 3:01 PM

## 2019-04-05 NOTE — Progress Notes (Signed)
[facility] Progress Note    Assessment/ Plan:   1. Advanced CKD - w/ progression and uremic symptoms. AVF won't be ready for another 4- 6 wks.Pt admitted and will plan forinitiation of HD.   Appreciate VIRplacingTDC9/21.   HD #1 9/21. I gave him a day off and will perform HD #2 9/23; he didn't do well with HD #1 but wishes to try again before giving up on RRT.  Being CLIP'd as well.  2. HTN - bp's OK, cont meds 3. Renal osteodystrophy  - started on renvela 1600 TIDM 9/23 - Renal diet 4. Anemia - @ goal 5. DM2 on insulin  6. Volume - looks euvolemic. Hold lasix for now. 7. Urine retention - per bladder scan, foley ordered9/20  Subjective:   In better spirits today; no events overnight. . Denies f/c/n/v.   Objective:   BP (!) 138/58 (BP Location: Left Arm)   Pulse 70   Temp 97.9 F (36.6 C) (Oral)   Resp 18   Ht 5\' 8"  (1.727 m)   Wt 91 kg   SpO2 98%   BMI 30.50 kg/m   Intake/Output Summary (Last 24 hours) at 04/05/2019 0840 Last data filed at 04/05/2019 0600 Gross per 24 hour  Intake 360 ml  Output 0 ml  Net 360 ml   Weight change: -1 kg  Physical Exam: Genalert, pleasant, elderly WM No rash, cyanosis or gangrene Chest clear bilat, no rales or wheezing, RIJ TC RRR no MRG Abd soft ntnd no mass or ascites +bs EXT trace LEedema, no wounds or ulcers Neuro is alert, Ox 3 , nf RUA BCF+bruit  Imaging: Ir Fluoro Guide Cv Line Right  Result Date: 04/03/2019 INDICATION: 85 year old with end-stage renal disease. Patient has a maturing right upper extremity fistula and needs a tunneled dialysis catheter for hemodialysis. EXAM: FLUOROSCOPIC AND ULTRASOUND GUIDED PLACEMENT OF A TUNNELED DIALYSIS CATHETER Physician: Stephan Minister. Anselm Pancoast, MD MEDICATIONS: Ancef 2 g; The antibiotic was administered within an appropriate time interval prior to skin puncture. ANESTHESIA/SEDATION: Versed 1.0 mg IV; Fentanyl 25 mcg IV; Moderate Sedation Time:  33 minutes The  patient was continuously monitored during the procedure by the interventional radiology nurse under my direct supervision. FLUOROSCOPY TIME:  Fluoroscopy Time: 3 minutes, 30 seconds, 20 mGy COMPLICATIONS: None immediate. PROCEDURE: Informed consent was obtained for placement of a tunneled dialysis catheter. The patient was placed supine on the interventional table. Ultrasound confirmed a patent right internal jugular vein. Ultrasound image was obtained for documentation. The right side of the neck was prepped and draped in a sterile fashion. The right neck was anesthetized with 1% lidocaine. Maximal barrier sterile technique was utilized including caps, mask, sterile gowns, sterile gloves, sterile drape, hand hygiene and skin antiseptic. A small incision was made with #11 blade scalpel. A 21 gauge needle directed into the right internal jugular vein with ultrasound guidance. A micropuncture dilator set was placed. A 23 cm tip to cuff Palindrome catheter was selected. The skin below the right clavicle was anesthetized and a small incision was made with an #11 blade scalpel. A subcutaneous tunnel was formed to the vein dermatotomy site. The catheter was brought through the tunnel. The vein dermatotomy site was dilated to accommodate a peel-away sheath. The catheter was placed through the peel-away sheath and directed into the central venous structures. The tip of the catheter was placed at the SVC and right atrium junction with fluoroscopy. Fluoroscopic images were obtained for documentation. Both lumens were found to aspirate and  flush well. The proper amount of heparin was flushed in both lumens. The vein dermatotomy site was closed using a single layer of absorbable suture and Dermabond. Gel-Foam was placed in subcutaneous tract. The catheter was secured to the skin using Prolene suture. IMPRESSION: Successful placement of a right jugular tunneled dialysis catheter using ultrasound and fluoroscopic guidance.  Electronically Signed   By: Markus Daft M.D.   On: 04/03/2019 10:29   Ir US Guide Vasc Access Right  Result Date: 04/03/2019 INDICATION: 85 year old with end-stage renal disease. Patient has a maturing right upper extremity fistula and needs a tunneled dialysis catheter for hemodialysis. EXAM: FLUOROSCOPIC AND ULTRASOUND GUIDED PLACEMENT OF A TUNNELED DIALYSIS CATHETER Physician: Stephan Minister. Anselm Pancoast, MD MEDICATIONS: Ancef 2 g; The antibiotic was administered within an appropriate time interval prior to skin puncture. ANESTHESIA/SEDATION: Versed 1.0 mg IV; Fentanyl 25 mcg IV; Moderate Sedation Time:  33 minutes The patient was continuously monitored during the procedure by the interventional radiology nurse under my direct supervision. FLUOROSCOPY TIME:  Fluoroscopy Time: 3 minutes, 30 seconds, 20 mGy COMPLICATIONS: None immediate. PROCEDURE: Informed consent was obtained for placement of a tunneled dialysis catheter. The patient was placed supine on the interventional table. Ultrasound confirmed a patent right internal jugular vein. Ultrasound image was obtained for documentation. The right side of the neck was prepped and draped in a sterile fashion. The right neck was anesthetized with 1% lidocaine. Maximal barrier sterile technique was utilized including caps, mask, sterile gowns, sterile gloves, sterile drape, hand hygiene and skin antiseptic. A small incision was made with #11 blade scalpel. A 21 gauge needle directed into the right internal jugular vein with ultrasound guidance. A micropuncture dilator set was placed. A 23 cm tip to cuff Palindrome catheter was selected. The skin below the right clavicle was anesthetized and a small incision was made with an #11 blade scalpel. A subcutaneous tunnel was formed to the vein dermatotomy site. The catheter was brought through the tunnel. The vein dermatotomy site was dilated to accommodate a peel-away sheath. The catheter was placed through the peel-away sheath and  directed into the central venous structures. The tip of the catheter was placed at the SVC and right atrium junction with fluoroscopy. Fluoroscopic images were obtained for documentation. Both lumens were found to aspirate and flush well. The proper amount of heparin was flushed in both lumens. The vein dermatotomy site was closed using a single layer of absorbable suture and Dermabond. Gel-Foam was placed in subcutaneous tract. The catheter was secured to the skin using Prolene suture. IMPRESSION: Successful placement of a right jugular tunneled dialysis catheter using ultrasound and fluoroscopic guidance. Electronically Signed   By: Markus Daft M.D.   On: 04/03/2019 10:29    Labs: BMET Recent Labs  Lab 04/01/19 1322 04/02/19 0407 04/02/19 0413 04/03/19 0446 04/03/19 1810 04/04/19 0056 04/04/19 0436  NA 141  --  143 142  --  141 142  K 6.0*  --  5.9* 4.9  --  4.6 3.3*  CL 116*  --  119* 117*  --  116* 110  CO2 12*  --  13* 12*  --  13* 20*  GLUCOSE 100*  --  78 82  --  119* 82  BUN 100*  --  100* 100*  --  102* 61*  CREATININE 7.70* 8.00* 7.84* 8.02*  --  8.43* 5.69*  CALCIUM 7.6*  --  7.6* 7.6*  --  7.5* 7.3*  PHOS  --   --   --   --  9.5* 9.3*  --    CBC Recent Labs  Lab 04/01/19 1322 04/02/19 0407 04/03/19 0446 04/04/19 0055 04/04/19 0436  WBC 8.6 10.9* 9.1 9.0 9.5  NEUTROABS 6.0  --   --  6.7  --   HGB 11.7* 12.3* 11.1* 10.6* 11.0*  HCT 37.9* 38.0* 35.0* 33.3* 34.2*  MCV 98.4 97.4 96.4 97.7 94.5  PLT 173 174 161 164 132*    Medications:    . aspirin EC  81 mg Oral Daily  . atorvastatin  40 mg Oral QHS  . calcitRIOL  0.25 mcg Oral Daily  . carvedilol  12.5 mg Oral BID  . Chlorhexidine Gluconate Cloth  6 each Topical Q0600  . erythromycin  1 application Left Eye QHS  . heparin injection (subcutaneous)  5,000 Units Subcutaneous Q8H  . insulin aspart  0-9 Units Subcutaneous TID WC  . isosorbide mononitrate  15 mg Oral QHS  . pantoprazole  40 mg Oral Daily  .  sertraline  150 mg Oral Daily  . sodium bicarbonate  650 mg Oral BID  . vitamin B-12  1,000 mcg Oral Daily      Otelia Santee, MD 04/05/2019, 8:40 AM

## 2019-04-05 NOTE — Evaluation (Signed)
Occupational Therapy Evaluation Patient Details Name: Rodney Farley MRN: [id number] DOB: January 03, 1932 Today's Date: 04/05/2019    History of Present Illness Pt is an 85 y/o male admitted secondary to increased weakness and fall. Found to be in ESRD is s/p R IJ catheter placement. Pt started on HD on 9/21. PMH includes ischemic cariomyopathy, s/p pacemaker, DM, HTN, CKD, COPD, CAD, and s/p AV fistula placement.    Clinical Impression   Pt with decline in function and safety with ADLs and ADL mobility with impaired strength, balance and cognition (STM)  Pt initially stated that he lives at home alone, but later stated that he is married. Pt reports that he was independent - Mod I with ADLs/selfcare, has a sock aid and reacher for LB ADLs, and used a cane for mobility in the house and RW outside. Pt currently requires mod A with LB ADLs and min guard A with mobility using cane and RW. Pt would benefit from acute OT services to address impairments to maximize level of function and safety  Follow Up Recommendations  Home health OT;Supervision/Assistance - 24 hour    Equipment Recommendations  Tub/shower bench    Recommendations for Other Services       Precautions / Restrictions Precautions Precautions: Fall Restrictions Weight Bearing Restrictions: No      Mobility Bed Mobility Overal bed mobility: Needs Assistance Bed Mobility: Sit to Supine;Supine to Sit     Supine to sit: Min guard Sit to supine: Min guard      Transfers Overall transfer level: Needs assistance Equipment used: Rolling walker (2 wheeled) Transfers: Sit to/from Stand Sit to Stand: Min guard         General transfer comment: Min guard for safety. Cues for safe hand placement.     Balance Overall balance assessment: Needs assistance Sitting-balance support: Feet supported;No upper extremity supported Sitting balance-Leahy Scale: Fair     Standing balance support: Bilateral upper extremity  supported;During functional activity Standing balance-Leahy Scale: Poor                             ADL either performed or assessed with clinical judgement   ADL Overall ADL's : Needs assistance/impaired Eating/Feeding: Independent;Sitting   Grooming: Wash/dry hands;Wash/dry face;Min guard;Standing   Upper Body Bathing: Set up;Sitting   Lower Body Bathing: Moderate assistance   Upper Body Dressing : Set up;Sitting   Lower Body Dressing: Moderate assistance   Toilet Transfer: Min guard;Ambulation;RW;Cueing for safety;Grab bars;Comfort height toilet(cane)   Toileting- Clothing Manipulation and Hygiene: Minimal assistance;Sit to/from stand   Tub/ Shower Transfer: Minimal assistance;Min guard;3 in 1;Ambulation;Grab bars   Functional mobility during ADLs: Min guard       Vision Baseline Vision/History: Wears glasses Patient Visual Report: No change from baseline       Perception     Praxis      Pertinent Vitals/Pain       Hand Dominance Right   Extremity/Trunk Assessment Upper Extremity Assessment Upper Extremity Assessment: Generalized weakness   Lower Extremity Assessment Lower Extremity Assessment: Defer to PT evaluation   Cervical / Trunk Assessment Cervical / Trunk Assessment: Normal   Communication Communication Communication: HOH   Cognition Arousal/Alertness: Awake/alert Behavior During Therapy: WFL for tasks assessed/performed Overall Cognitive Status: No family/caregiver present to determine baseline cognitive functioning  General Comments: Questionable cognitive deficits. Pt tended to lose his train of thought and had some memory deficits.    General Comments       Exercises     Shoulder Instructions      Home Living Family/patient expects to be discharged to:: Private residence Living Arrangements: Spouse/significant other(pt firts stated that he lived at home alone, and then said  he was a wife) Available Help at Discharge: Family;Available PRN/intermittently Type of Home: House Home Access: Level entry     Home Layout: One level     Bathroom Shower/Tub: Teacher, early years/pre: Handicapped height     Home Equipment: Environmental consultant - 2 wheels;Cane - single point;Shower seat;Grab bars - tub/shower;Grab bars - toilet;Adaptive equipment Adaptive Equipment: Reacher;Sock aid        Prior Functioning/Environment Level of Independence: Independent with assistive device(s)        Comments: Uses cane for ambulation         OT Problem List: Decreased strength;Impaired balance (sitting and/or standing);Decreased safety awareness;Decreased activity tolerance;Decreased knowledge of use of DME or AE      OT Treatment/Interventions: Self-care/ADL training;DME and/or AE instruction;Therapeutic activities;Therapeutic exercise;Patient/family education    OT Goals(Current goals can be found in the care plan section) Acute Rehab OT Goals Patient Stated Goal: to get stronger OT Goal Formulation: With patient Time For Goal Achievement: 04/19/19 Potential to Achieve Goals: Good ADL Goals Pt Will Perform Grooming: with set-up;with supervision;standing Pt Will Perform Lower Body Bathing: with min assist;with min guard assist;sit to/from stand Pt Will Perform Lower Body Dressing: with min assist;with min guard assist;sit to/from stand;with adaptive equipment Pt Will Transfer to Toilet: with supervision;ambulating;grab bars Pt Will Perform Toileting - Clothing Manipulation and hygiene: with min guard assist;with supervision;sit to/from stand Pt Will Perform Tub/Shower Transfer: with supervision;shower seat;ambulating;grab bars;rolling walker;3 in 1  OT Frequency: Min 2X/week   Barriers to D/C:    no barriers       Co-evaluation              AM-PAC OT "6 Clicks" Daily Activity     Outcome Measure Help from another person eating meals?: None Help from  another person taking care of personal grooming?: A Little Help from another person toileting, which includes using toliet, bedpan, or urinal?: A Little Help from another person bathing (including washing, rinsing, drying)?: A Lot Help from another person to put on and taking off regular upper body clothing?: A Little Help from another person to put on and taking off regular lower body clothing?: A Lot 6 Click Score: 17   End of Session Equipment Utilized During Treatment: Gait belt;Rolling walker(cane, 3 in 1)  Activity Tolerance: Patient tolerated treatment well Patient left: in bed;with call bell/phone within reach  OT Visit Diagnosis: Unsteadiness on feet (R26.81);Other abnormalities of gait and mobility (R26.89);Muscle weakness (generalized) (M62.81);Other symptoms and signs involving cognitive function                Time: [id number] OT Time Calculation (min): 29 min Charges:  OT General Charges $OT Visit: 1 Visit OT Evaluation $OT Eval Moderate Complexity: 1 Mod OT Treatments $Self Care/Home Management : 8-22 mins    Emmit Alexanders [facility] 04/05/2019, 3:07 PM

## 2019-04-05 NOTE — Plan of Care (Signed)
  Problem: Education: Goal: Knowledge of General Education information will improve Description Including pain rating scale, medication(s)/side effects and non-pharmacologic comfort measures Outcome: Progressing   Problem: Health Behavior/Discharge Planning: Goal: Ability to manage health-related needs will improve Outcome: Progressing   

## 2019-04-06 ENCOUNTER — Encounter: Payer: Self-pay | Admitting: Cardiology

## 2019-04-06 ENCOUNTER — Inpatient Hospital Stay (HOSPITAL_COMMUNITY)
Admission: RE | Admit: 2019-04-06 | Discharge: 2019-04-06 | Disposition: A | Discharge status: Home / Self Care | Payer: PPO | Source: Ambulatory Visit | Attending: Nephrology | Admitting: Nephrology

## 2019-04-06 LAB — CBC
HCT: 34 % — ABNORMAL LOW (ref 39.0–52.0)
Hemoglobin: 10.9 g/dL — ABNORMAL LOW (ref 13.0–17.0)
MCH: 30.4 pg (ref 26.0–34.0)
MCHC: 32.1 g/dL (ref 30.0–36.0)
MCV: 95 fL (ref 80.0–100.0)
Platelets: 88 10*3/uL — ABNORMAL LOW (ref 150–400)
RBC: 3.58 MIL/uL — ABNORMAL LOW (ref 4.22–5.81)
RDW: 14.6 % (ref 11.5–15.5)
WBC: 13 10*3/uL — ABNORMAL HIGH (ref 4.0–10.5)
nRBC: 0 % (ref 0.0–0.2)

## 2019-04-06 LAB — RENAL FUNCTION PANEL
Albumin: 2.7 g/dL — ABNORMAL LOW (ref 3.5–5.0)
Anion gap: 13 (ref 5–15)
BUN: 46 mg/dL — ABNORMAL HIGH (ref 8–23)
CO2: 22 mmol/L (ref 22–32)
Calcium: 7.6 mg/dL — ABNORMAL LOW (ref 8.9–10.3)
Chloride: 106 mmol/L (ref 98–111)
Creatinine, Ser: 5.2 mg/dL — ABNORMAL HIGH (ref 0.61–1.24)
GFR calc Af Amer: 11 mL/min — ABNORMAL LOW (ref >60)
GFR calc non Af Amer: 9 mL/min — ABNORMAL LOW (ref >60)
Glucose, Bld: 114 mg/dL — ABNORMAL HIGH (ref 70–99)
Phosphorus: 3.8 mg/dL (ref 2.5–4.6)
Potassium: 3.2 mmol/L — ABNORMAL LOW (ref 3.5–5.1)
Sodium: 141 mmol/L (ref 135–145)

## 2019-04-06 LAB — GLUCOSE, CAPILLARY
Glucose-Capillary: 113 mg/dL — ABNORMAL HIGH (ref 70–99)
Glucose-Capillary: 106 mg/dL — ABNORMAL HIGH (ref 70–99)
Glucose-Capillary: 103 mg/dL — ABNORMAL HIGH (ref 70–99)
Glucose-Capillary: 118 mg/dL — ABNORMAL HIGH (ref 70–99)

## 2019-04-06 MED ORDER — ONDANSETRON HCL 4 MG/2ML IJ SOLN
4.0000 mg | Freq: Four times a day (QID) | INTRAMUSCULAR | Status: DC | PRN
  Administered 2019-04-06: 4 mg via INTRAVENOUS
  Filled 2019-04-06: qty 2

## 2019-04-06 NOTE — Plan of Care (Signed)
  Problem: Education: Goal: Knowledge of General Education information will improve Description Including pain rating scale, medication(s)/side effects and non-pharmacologic comfort measures Outcome: Progressing   

## 2019-04-06 NOTE — Progress Notes (Signed)
Renal Navigator met with patient to follow up on our initial conversation about referral to OP HD clinic. Patient seems to be in much better spirits than our first conversation and states he is ready to move forward with HD. He understands that the clinic [place] home ([place]) is not currently accepting new referrals. Therefore, he requested to be referred to the clinic where his primary Nephrologist/Dr. Justin Mend is Medical Director. Renal Navigator submitted OP HD treatment referral to [facility] to see if they can accommodate him. Renal Navigator will follow closely.  Alphonzo Cruise, [place] Renal Navigator [phone]

## 2019-04-06 NOTE — Progress Notes (Signed)
Patient accepted for OP HD treatment at [facility] on a TTS schedule with a seat time of 12:50pm. He needs to arrive to his appointments at 12:30pm.  If his first appointment falls on a Saturday, he should report to the clinic on the Friday prior to complete intake paperwork. If his first appointment falls on a Tuesday or Thursday, he can arrive at 12:00pm to complete paperwork prior to his first OP HD treatment. Renal Navigator notified Dr. Lurlean Horns, but has not has an opportunity to notify patient of his acceptance and schedule. Renal Navigator will meet with patient on Friday, 9/25 to discuss.  Rodney Farley, [place] Renal Navigator [phone]

## 2019-04-06 NOTE — Progress Notes (Signed)
PROGRESS NOTE    Rodney Farley  [id number] DOB: Jun 04, 1932 DOA: 04/01/2019 PCP: [name], MD   Brief Narrative: 85 year old with past medical history significant for end-stage renal disease on hemodialysis, who had recent AV fistula placed on 02/07/2019 with plan for initiation of hemodialysis when its matures.  Patient also has a history of diabetes, complete heart block status post AICD, anemia of chronic disease, COPD, diverticulosis, BPH who presents to the ER with generalized weakness and fall from bed. He was unable to get out of bed, EMS was called, in the ED he was found to have a creatinine greater than 7 BUN of 100 and potassium of 6.  Nephrology was consulted. Patient was started on hemodialysis on 9/21.  He underwent hemodialysis catheter placement.    Assessment & Plan:   Principal Problem:   ESRD (end stage renal disease) ([place]) Active Problems:   Ischemic cardiomyopathy   Obesity   Pacemaker   Depression, major, recurrent, moderate (HCC)   Controlled type 2 diabetes mellitus with stage 4 chronic kidney disease (HCC)   HTN (hypertension)   GERD (gastroesophageal reflux disease)   Chronic kidney disease (CKD), stage IV (severe) (HCC)   Diabetic peripheral neuropathy (HCC)   COPD (chronic obstructive pulmonary disease) (HCC)   Hyperlipidemia   Hyperkalemia, diminished renal excretion   1-Progressive chronic kidney disease stage V now end-stage renal disease new on hemodialysis -Patient present with uremic symptoms, BUN 100 creatinine 7.9. -Nephrology was consulted.  Had hemodialysis placed on 9/21. -He has a AV fistula which is not mature yet. -He was able to tolerate hemodialysis yesterday.  He had mild nausea.  He want to move forward in regards outpatient hemodialysis .   2-Hyperkalemia: Due to chronic kidney disease.  Receive Lokelma.  Resolved  3-Coronary artery disease/ischemic cardiomyopathy, ICD: Has multiple vessels coronary artery  disease treated with multiple stent in 2016, followed by Dr. [name]. Continue with aspirin, Coreg, Imdur and statin. Ejection fraction 25 to 30% with biventricular ICD. Lasix has been discontinued now that he is on hemodialysis  4-Urine retention: Foley placed in the ER 9/20, 800 cc urine retention Need to monitor with bladder scan Foley was removed.  5-COPD: Stable  Diabetes type 2: Lantus is stopped due to hypoglycemia. Monitor CBG. Leukocytosis: Monitor.  No fevers.    Estimated body mass index is 32.52 kg/m as calculated from the following:   Height as of this encounter: 5\' 8"  (1.727 m).   Weight as of this encounter: 97 kg.   DVT prophylaxis: Heparin Code Status: Full code Family Communication: No family at bedside Disposition Plan: For hemodialysis today awaiting hemodialysis comply clip Consultants:   Nephrology  IR  Procedures:   Hemodialysis tunnel catheter 9/21  Antimicrobials:    Subjective: He is in better spirits, he denies dyspnea.  He was able to tolerate hemodialysis yesterday he only had mild nausea  Objective: Vitals:   04/06/19 0845 04/06/19 1000 04/06/19 1100 04/06/19 1300  BP: (!) 166/63 (!) 177/75 (!) 175/83 (!) 141/48  Pulse: 69 72 73 70  Resp: 18 19  18   Temp: 98.2 F (36.8 C) 99.7 F (37.6 C)    TempSrc: Oral Oral    SpO2: 95% 96%  96%  Weight:      Height:        Intake/Output Summary (Last 24 hours) at 04/06/2019 1641 Last data filed at 04/06/2019 1300 Gross per 24 hour  Intake 940 ml  Output 1000 ml  Net -60 ml  Filed Weights   04/05/19 1520 04/05/19 1825 04/05/19 2058  Weight: 98 kg 97 kg 97 kg    Examination:  General exam: No acute distress Respiratory system: Clear to auscultation Cardiovascular system: S1, S2 regular rhythm and rate Gastrointestinal system: Bowel sounds present, soft nontender nondistended Central nervous system; alert and oriented Extremities: Symmetric power Skin: Rash   Data Reviewed: I  have personally reviewed following labs and imaging studies  CBC: Recent Labs  Lab 04/01/19 1322  04/03/19 0446 04/04/19 0055 04/04/19 0436 04/05/19 0715 04/06/19 0500  WBC 8.6   < > 9.1 9.0 9.5 9.4 13.0*  NEUTROABS 6.0  --   --  6.7  --   --   --   HGB 11.7*   < > 11.1* 10.6* 11.0* 10.7* 10.9*  HCT 37.9*   < > 35.0* 33.3* 34.2* 32.7* 34.0*  MCV 98.4   < > 96.4 97.7 94.5 94.5 95.0  PLT 173   < > 161 164 132* 131* 88*   < > = values in this interval not displayed.   Basic Metabolic Panel: Recent Labs  Lab 04/03/19 0446 04/03/19 1810 04/04/19 0056 04/04/19 0436 04/05/19 0715 04/06/19 0500  NA 142  --  141 142 143 141  K 4.9  --  4.6 3.3* 3.2* 3.2*  CL 117*  --  116* 110 112* 106  CO2 12*  --  13* 20* 19* 22  GLUCOSE 82  --  119* 82 96 114*  BUN 100*  --  102* 61* 76* 46*  CREATININE 8.02*  --  8.43* 5.69* 7.16* 5.20*  CALCIUM 7.6*  --  7.5* 7.3* 7.4* 7.6*  PHOS  --  9.5* 9.3*  --   --  3.8   GFR: Estimated Creatinine Clearance: 11.7 mL/min (A) (by C-G formula based on SCr of 5.2 mg/dL (H)). Liver Function Tests: Recent Labs  Lab 04/02/19 0413 04/04/19 0056 04/06/19 0500  AST 12*  --   --   ALT 14  --   --   ALKPHOS 102  --   --   BILITOT 0.5  --   --   PROT 6.4*  --   --   ALBUMIN 3.3* 2.7* 2.7*   No results for input(s): LIPASE, AMYLASE in the last 168 hours. No results for input(s): AMMONIA in the last 168 hours. Coagulation Profile: Recent Labs  Lab 04/03/19 0446  INR 1.3*   Cardiac Enzymes: No results for input(s): CKTOTAL, CKMB, CKMBINDEX, TROPONINI in the last 168 hours. BNP (last 3 results) No results for input(s): PROBNP in the last 8760 hours. HbA1C: No results for input(s): HGBA1C in the last 72 hours. CBG: Recent Labs  Lab 04/05/19 1109 04/05/19 2059 04/06/19 0703 04/06/19 1125 04/06/19 1623  GLUCAP 121* 120* 113* 106* 103*   Lipid Profile: No results for input(s): CHOL, HDL, LDLCALC, TRIG, CHOLHDL, LDLDIRECT in the last 72 hours.  Thyroid Function Tests: No results for input(s): TSH, T4TOTAL, FREET4, T3FREE, THYROIDAB in the last 72 hours. Anemia Panel: No results for input(s): VITAMINB12, FOLATE, FERRITIN, TIBC, IRON, RETICCTPCT in the last 72 hours. Sepsis Labs: No results for input(s): PROCALCITON, LATICACIDVEN in the last 168 hours.  Recent Results (from the past 240 hour(s))  SARS CORONAVIRUS 2 (TAT 6-24 HRS) Nasopharyngeal Nasopharyngeal Swab     Status: None   Collection Time: 04/01/19  8:00 PM   Specimen: Nasopharyngeal Swab  Result Value Ref Range Status   SARS Coronavirus 2 NEGATIVE NEGATIVE Final    Comment: (  NOTE) SARS-CoV-2 target nucleic acids are NOT DETECTED. The SARS-CoV-2 RNA is generally detectable in upper and lower respiratory specimens during the acute phase of infection. Negative results do not preclude SARS-CoV-2 infection, do not rule out co-infections with other pathogens, and should not be used as the sole basis for treatment or other patient management decisions. Negative results must be combined with clinical observations, patient history, and epidemiological information. The expected result is Negative. Fact Sheet for Patients: SugarRoll.be Fact Sheet for Healthcare Providers: https://www.woods-mathews.com/ This test is not yet approved or cleared by the [place] FDA and  has been authorized for detection and/or diagnosis of SARS-CoV-2 by FDA under an Emergency Use Authorization (EUA). This EUA will remain  in effect (meaning this test can be used) for the duration of the COVID-19 declaration under Section 56 4(b)(1) of the Act, 21 U.S.C. section 360bbb-3(b)(1), unless the authorization is terminated or revoked sooner. Performed at [facility], [place] [address], [place], [place] [address]          Radiology Studies: No results found.      Scheduled Meds: . aspirin EC  81 mg Oral Daily  . atorvastatin  40 mg  Oral QHS  . calcitRIOL  0.25 mcg Oral Daily  . carvedilol  12.5 mg Oral BID  . Chlorhexidine Gluconate Cloth  6 each Topical Q0600  . erythromycin  1 application Left Eye QHS  . heparin injection (subcutaneous)  5,000 Units Subcutaneous Q8H  . insulin aspart  0-9 Units Subcutaneous TID WC  . isosorbide mononitrate  15 mg Oral QHS  . pantoprazole  40 mg Oral Daily  . sertraline  150 mg Oral Daily  . sevelamer carbonate  1,600 mg Oral TID WC  . vitamin B-12  1,000 mcg Oral Daily   Continuous Infusions:   LOS: 5 days    Time spent: 35 minutes.     Elmarie Shiley, MD [facility] Pager [phone]  If 7PM-7AM, please contact night-coverage [email or web address] Password [facility] 04/06/2019, 4:41 PM

## 2019-04-06 NOTE — Progress Notes (Signed)
Remote ICD transmission.   

## 2019-04-06 NOTE — Progress Notes (Signed)
Physical Therapy Treatment Patient Details Name: Rodney Farley MRN: [id number] DOB: [birth year] Today's Date: 04/06/2019    History of Present Illness Pt is an 85 y/o male admitted secondary to increased weakness and fall. Found to be in ESRD is s/p R IJ catheter placement. Pt started on HD on 9/21. PMH includes ischemic cariomyopathy, s/p pacemaker, DM, HTN, CKD, COPD, CAD, and s/p AV fistula placement.     PT Comments    Patient received in bed, states a walk sounds great. Patient very pleasant. Requires no physical assist for supine to sit. Transfers with min guard/supervision. He ambulated 200 feet with RW and min guard/supervision. Performed toileting, clean up with supervision. Requires cues to remember walker at times. Patient has no overt LOB or difficulty with ambulation, but demonstrates decreased cadence. Patient will benefit from continued PT to improve strength and activity tolerance.         Follow Up Recommendations  Home health PT;Supervision for mobility/OOB     Equipment Recommendations  None recommended by PT    Recommendations for Other Services       Precautions / Restrictions Precautions Precautions: Fall Restrictions Weight Bearing Restrictions: No    Mobility  Bed Mobility Overal bed mobility: Modified Independent Bed Mobility: Supine to Sit     Supine to sit: Supervision     General bed mobility comments: no physical assist to go from supine to sit.  Transfers Overall transfer level: Needs assistance Equipment used: Rolling walker (2 wheeled) Transfers: Sit to/from Stand Sit to Stand: Supervision         General transfer comment: cues to remember to take walker when coming out of bathroom  Ambulation/Gait Ambulation/Gait assistance: Supervision Gait Distance (Feet): 200 Feet Assistive device: Rolling walker (2 wheeled) Gait Pattern/deviations: Decreased stride length;Step-through pattern Gait velocity: Decreased   General Gait  Details: Slow cadence, steady this day. Talking throughout session.   Stairs             Wheelchair Mobility    Modified Rankin (Stroke Patients Only)       Balance Overall balance assessment: Needs assistance Sitting-balance support: Feet supported Sitting balance-Leahy Scale: Good     Standing balance support: Bilateral upper extremity supported Standing balance-Leahy Scale: Good Standing balance comment: able to release walker standing in bathroom for clean up and at sink to wash hands.                            Cognition Arousal/Alertness: Awake/alert Behavior During Therapy: WFL for tasks assessed/performed Overall Cognitive Status: Within Functional Limits for tasks assessed                                        Exercises      General Comments        Pertinent Vitals/Pain Pain Assessment: No/denies pain    Home Living                      Prior Function            PT Goals (current goals can now be found in the care plan section) Acute Rehab PT Goals Patient Stated Goal: to get stronger PT Goal Formulation: With patient Time For Goal Achievement: 04/18/19 Potential to Achieve Goals: Good Progress towards PT goals: Progressing toward goals    Frequency  Min 3X/week      PT Plan Current plan remains appropriate    Co-evaluation              AM-PAC PT "6 Clicks" Mobility   Outcome Measure  Help needed turning from your back to your side while in a flat bed without using bedrails?: None Help needed moving from lying on your back to sitting on the side of a flat bed without using bedrails?: None Help needed moving to and from a bed to a chair (including a wheelchair)?: A Little Help needed standing up from a chair using your arms (e.g., wheelchair or bedside chair)?: A Little Help needed to walk in hospital room?: A Little Help needed climbing 3-5 steps with a railing? : A Little 6 Click  Score: 20    End of Session Equipment Utilized During Treatment: Gait belt Activity Tolerance: Patient tolerated treatment well Patient left: in chair;with chair alarm set;with call bell/phone within reach Nurse Communication: Mobility status PT Visit Diagnosis: Muscle weakness (generalized) (M62.81)     Time: [id number] PT Time Calculation (min) (ACUTE ONLY): 28 min  Charges:  $Gait Training: 23-37 mins                     [employer], PT, [facility] 04/06/19,12:27 PM

## 2019-04-07 ENCOUNTER — Inpatient Hospital Stay (HOSPITAL_COMMUNITY): Payer: PPO

## 2019-04-07 LAB — CBC
HCT: 28.8 % — ABNORMAL LOW (ref 39.0–52.0)
Hemoglobin: 9.6 g/dL — ABNORMAL LOW (ref 13.0–17.0)
MCH: 31.3 pg (ref 26.0–34.0)
MCHC: 33.3 g/dL (ref 30.0–36.0)
MCV: 93.8 fL (ref 80.0–100.0)
Platelets: 93 10*3/uL — ABNORMAL LOW (ref 150–400)
RBC: 3.07 MIL/uL — ABNORMAL LOW (ref 4.22–5.81)
RDW: 14.6 % (ref 11.5–15.5)
WBC: 18.4 10*3/uL — ABNORMAL HIGH (ref 4.0–10.5)
nRBC: 0 % (ref 0.0–0.2)

## 2019-04-07 LAB — URINALYSIS, ROUTINE W REFLEX MICROSCOPIC
Bilirubin Urine: NEGATIVE
Glucose, UA: 50 mg/dL — AB
Ketones, ur: NEGATIVE mg/dL
Nitrite: NEGATIVE
Protein, ur: >=300 mg/dL — AB
Specific Gravity, Urine: 1.015 (ref 1.005–1.030)
WBC, UA: >50 WBC/hpf — ABNORMAL HIGH (ref 0–5)
pH: 5 (ref 5.0–8.0)

## 2019-04-07 LAB — GLUCOSE, CAPILLARY
Glucose-Capillary: 93 mg/dL (ref 70–99)
Glucose-Capillary: 108 mg/dL — ABNORMAL HIGH (ref 70–99)
Glucose-Capillary: 107 mg/dL — ABNORMAL HIGH (ref 70–99)
Glucose-Capillary: 124 mg/dL — ABNORMAL HIGH (ref 70–99)

## 2019-04-07 MED ORDER — SEVELAMER CARBONATE 800 MG PO TABS: 1600.0000 mg | ORAL_TABLET | Freq: Three times a day (TID) | ORAL | 0 refills | Status: AC

## 2019-04-07 MED ORDER — SODIUM CHLORIDE 0.9 % IV SOLN
1.0000 g | INTRAVENOUS | Status: DC
  Administered 2019-04-07: 1 g via INTRAVENOUS
  Filled 2019-04-07: qty 1
  Filled 2019-04-07: qty 10

## 2019-04-07 MED ORDER — ISOSORBIDE MONONITRATE ER 30 MG PO TB24: 15.0000 mg | ORAL_TABLET | Freq: Every day | ORAL | 0 refills | Status: DC

## 2019-04-07 NOTE — Progress Notes (Signed)
[facility] Progress Note    Assessment/ Plan:   1. Advanced CKD - w/ progression and uremic symptoms. AVF won't be ready for another 4- 6 wks.Pt admitted and will plan forinitiation of HD.   Appreciate VIRplacingTDC9/21.  HD #1 9/21. I gave him a day off and will perform HD #2 9/23; he didn't do well with HD #1 but wishes to try again before giving up on RRT.  HD #2 tolerated better 9/23  CLIP'd as well -> has a spot EGB TTS 2nd shift. Will hold on HD today as he should be able to d/c and go to outpt HD Sat.  2. HTN - bp's OK, cont meds 3. Renal osteodystrophy  - started on renvela 1600 TIDM 9/23 - Renal diet 4. Anemia - @ goal 5. DM2 on insulin  6. Volume - looks euvolemic. Hold lasix for now. 7. Urine retention - per bladder scan, foley ordered9/20  Subjective:   In better spirits today; no events overnight. . Denies f/c/n/v.   Objective:   BP (!) 136/59 (BP Location: Left Wrist)   Pulse 68   Temp 97.8 F (36.6 C) (Oral)   Resp 18   Ht 5\' 8"  (1.727 m)   Wt 97.1 kg   SpO2 (!) 89%   BMI 32.55 kg/m   Intake/Output Summary (Last 24 hours) at 04/07/2019 0900 Last data filed at 04/07/2019 0528 Gross per 24 hour  Intake 840 ml  Output 100 ml  Net 740 ml   Weight change: -0.9 kg  Physical Exam: Genalert, pleasant, elderly WM No rash, cyanosis or gangrene Chest clear bilat, no rales or wheezing, RIJ TC RRR no MRG Abd soft ntnd no mass or ascites +bs EXT trace LEedema, no wounds or ulcers Neuro is alert, Ox 3 , nf RUA BCF+bruit  Imaging: No results found.  Labs: BMET Recent Labs  Lab 04/01/19 1322 04/02/19 0407 04/02/19 0413 04/03/19 0446 04/03/19 1810 04/04/19 0056 04/04/19 0436 04/05/19 0715 04/06/19 0500  NA 141  --  143 142  --  141 142 143 141  K 6.0*  --  5.9* 4.9  --  4.6 3.3* 3.2* 3.2*  CL 116*  --  119* 117*  --  116* 110 112* 106  CO2 12*  --  13* 12*  --  13* 20* 19* 22  GLUCOSE 100*  --  78 82  --  119*  82 96 114*  BUN 100*  --  100* 100*  --  102* 61* 76* 46*  CREATININE 7.70* 8.00* 7.84* 8.02*  --  8.43* 5.69* 7.16* 5.20*  CALCIUM 7.6*  --  7.6* 7.6*  --  7.5* 7.3* 7.4* 7.6*  PHOS  --   --   --   --  9.5* 9.3*  --   --  3.8   CBC Recent Labs  Lab 04/01/19 1322  04/04/19 0055 04/04/19 0436 04/05/19 0715 04/06/19 0500 04/07/19 0553  WBC 8.6   < > 9.0 9.5 9.4 13.0* 18.4*  NEUTROABS 6.0  --  6.7  --   --   --   --   HGB 11.7*   < > 10.6* 11.0* 10.7* 10.9* 9.6*  HCT 37.9*   < > 33.3* 34.2* 32.7* 34.0* 28.8*  MCV 98.4   < > 97.7 94.5 94.5 95.0 93.8  PLT 173   < > 164 132* 131* 88* 93*   < > = values in this interval not displayed.    Medications:    .  aspirin EC  81 mg Oral Daily  . atorvastatin  40 mg Oral QHS  . calcitRIOL  0.25 mcg Oral Daily  . carvedilol  12.5 mg Oral BID  . Chlorhexidine Gluconate Cloth  6 each Topical Q0600  . erythromycin  1 application Left Eye QHS  . heparin injection (subcutaneous)  5,000 Units Subcutaneous Q8H  . insulin aspart  0-9 Units Subcutaneous TID WC  . isosorbide mononitrate  15 mg Oral QHS  . pantoprazole  40 mg Oral Daily  . sertraline  150 mg Oral Daily  . sevelamer carbonate  1,600 mg Oral TID WC  . vitamin B-12  1,000 mcg Oral Daily      Rodney Santee, MD 04/07/2019, 9:00 AM

## 2019-04-07 NOTE — Progress Notes (Signed)
Occupational Therapy Treatment Patient Details Name: Rodney Farley MRN: [id number] DOB: 12/01/1931 Today's Date: 04/07/2019    History of present illness Pt is an 85 y/o male admitted secondary to increased weakness and fall. Found to be in ESRD is s/p R IJ catheter placement. Pt started on HD on 9/21. PMH includes ischemic cariomyopathy, s/p pacemaker, DM, HTN, CKD, COPD, CAD, and s/p AV fistula placement.    OT comments  Pt making great progress towards OT goals this session. Session focus on functional mobility and ADL routine at home. Pt MOD I for bed mobility; supervision for sit>stand with RW. Pt requested to ambulate with SPC rather than RW; provided education on benefits of using RW at first when pt returns home to assist with safety. Pt reports when he falls it is often when he is attempting to turn; education on fall prevention and body mechanics during functional mobility. Pt reports he uses his reacher and sock aid at home for LB dressing and will continue to do so. Pt min guard- min a for functional mobility with SPC. Cues for body mechanics during functional mobility. Pt MIN guard for simulated toilet transfer to recliner; good safety awareness when reaching back for surface. See equipment recommendations for updates to needed items- will let OTR know about change in POC. Pt likely to DC home with HHOT this afternoon- will continue to follow acutely for OT needs.    Follow Up Recommendations  Home health OT;Supervision/Assistance - 24 hour    Equipment Recommendations  Tub/shower bench ( pt would rather wait and discuss with daughter-in-law before leaving the hospital with one- can hold off for now)   Recommendations for Other Services      Precautions / Restrictions Precautions Precautions: Fall Restrictions Weight Bearing Restrictions: No       Mobility Bed Mobility Overal bed mobility: Modified Independent             General bed mobility comments: no  physical assist to go from supine to sit.  Transfers Overall transfer level: Needs assistance Equipment used: Rolling walker (2 wheeled);Straight cane(requested to use cane after sit>stand with RW) Transfers: Sit to/from Stand Sit to Stand: Supervision         General transfer comment: supervision for safety; cues to widen BOS when standing    Balance Overall balance assessment: Needs assistance Sitting-balance support: Feet supported Sitting balance-Leahy Scale: Good     Standing balance support: Single extremity supported Standing balance-Leahy Scale: Good Standing balance comment: able to static stand while holding on to cane                           ADL either performed or assessed with clinical judgement   ADL Overall ADL's : Needs assistance/impaired                       Lower Body Dressing Details (indicate cue type and reason): pt states he uses his sock aid at home and is going to start using reacher to don pants Toilet Transfer: Min guard;Ambulation;Cueing for safety(cane) Toilet Transfer Details (indicate cue type and reason): simulated toilet transfer after ambulation; MIN guard for recliner with cane; good safety awareness with transitions         Functional mobility during ADLs: Min guard;Minimal assistance;Cane General ADL Comments: MIN A for functional ambulation during turns; pt with narrow BOS when turning; pt states he often falls when trying to turn at  home; education on widening BOS; pt states he has been using AE at home and reports no concerns with ADLs at home     Vision Baseline Vision/History: Wears glasses Wears Glasses: At all times Patient Visual Report: No change from baseline     Perception     Praxis      Cognition Arousal/Alertness: Awake/alert Behavior During Therapy: WFL for tasks assessed/performed Overall Cognitive Status: Within Functional Limits for tasks assessed                                  General Comments: no cognitive deficts observed this session; able to state PLOF from inital eval        Exercises     Shoulder Instructions       General Comments      Pertinent Vitals/ Pain       Pain Assessment: No/denies pain  Home Living                                          Prior Functioning/Environment              Frequency  Min 2X/week        Progress Toward Goals  OT Goals(current goals can now be found in the care plan section)  Progress towards OT goals: Progressing toward goals  Acute Rehab OT Goals Patient Stated Goal: to get stronger OT Goal Formulation: With patient Time For Goal Achievement: 04/19/19 Potential to Achieve Goals: Good  Plan Discharge plan remains appropriate    Co-evaluation                 AM-PAC OT "6 Clicks" Daily Activity     Outcome Measure   Help from another person eating meals?: None Help from another person taking care of personal grooming?: A Little Help from another person toileting, which includes using toliet, bedpan, or urinal?: A Little Help from another person bathing (including washing, rinsing, drying)?: A Lot Help from another person to put on and taking off regular upper body clothing?: A Little Help from another person to put on and taking off regular lower body clothing?: A Lot 6 Click Score: 17    End of Session Equipment Utilized During Treatment: Gait belt;Rolling walker;Other (comment)(cane)  OT Visit Diagnosis: Unsteadiness on feet (R26.81);Other abnormalities of gait and mobility (R26.89);Muscle weakness (generalized) (M62.81);Other symptoms and signs involving cognitive function   Activity Tolerance Patient tolerated treatment well   Patient Left in chair;with call bell/phone within reach;with chair alarm set   Nurse Communication          Time: [id number] OT Time Calculation (min): 27 min  Charges: OT General Charges $OT Visit: 1 Visit OT  Treatments $Self Care/Home Management : 23-37 mins  [place], [place] [phone] [place] 04/07/2019, 11:17 AM

## 2019-04-07 NOTE — Progress Notes (Signed)
Walked patient 50 feet on room air and o2 sat was 95%.

## 2019-04-07 NOTE — Progress Notes (Signed)
Renal Navigator met with patient at bedside to provide him with written information regarding OP HD schedule at [facility] clinic. Patient appreciative.  Renal Navigator received call from Primary MD stating patient's son has questions about OP HD and would like a call. Renal Navigator call son who states patient needs to receive treatment at [facility] clinic because it is so close to his home. Renal Navigator stated understanding, but that at this time, [facility] is not open to new referrals. He informed Renal Navigator that he had already been contacted by Dr. Jason Nest office a few weeks ago stating that he his father had been accepted for treatment at the [facility] clinic location. Renal Navigator apologized for the confusion and informed son that Navigator will investigate the situation. Renal Navigator contacted the [facility] and was told that patient had been referred to their clinic from the office weeks ago and has been accepted. Renal Navigator had not been notified of this by Admissions. [facility] staff stated that they have been waiting for patient's AVF to mature in order to start treatment for patient. Renal Navigator explained that he is now in the hospital and has a tunneled catheter. [facility] staff member will speak with Clinic Manager regarding patient's OP HD seat schedule now that he is ready to start in the clinic and call Renal Navigator back. Once Navigator receives this call, Navigator will contact son and update patient and medical team.  Alphonzo Cruise, [place] Renal Navigator  [phone]

## 2019-04-07 NOTE — TOC Initial Note (Signed)
Transition of Care [facility]) - Initial/Assessment Note    Patient Details  Name: Rodney Farley MRN: [id number] Date of Birth: 10/08/1931  Transition of Care [facility]) CM/SW Contact:    Bartholomew Crews, RN Phone Number: [phone] 04/07/2019, 4:35 PM  Clinical Narrative:                 Spoke with patient and daughter in law, [name], at the bedside. PTA home alone. Family supportive. Already has walker, wheelchair, and cane. Anticipate transition home tomorrow after hemodialysis. Patient will start outpatient hemodialysis on Monday 12:50 (tentatively) - [name] aware of patient need to be at dialysis on noon on Monday to complete admission paperwork. Discussed home health recommendations for PT and OT - agreeable. Have used [facility] in the past. Referral accepted for outpatient follow up. Family to provide transportation home. Following for transition of care needs.   Expected Discharge Plan: [place] Barriers to Discharge: Continued Medical Work up   Patient Goals and CMS Choice Patient states their goals for this hospitalization and ongoing recovery are:: return home CMS Medicare.gov Compare Post Acute Care list provided to:: Patient Choice offered to / list presented to : Patient, Adult Children  Expected Discharge Plan and Services Expected Discharge Plan: [place]   Discharge Planning Services: CM Consult Post Acute Care Choice: Durable Medical Equipment, Home Health Living arrangements for the past 2 months: Apartment                 DME Arranged: N/A DME Agency: NA       HH Arranged: PT, OT HH Agency: [facility] Date [place]: 04/07/19 Time [place]: 1633 Representative spoke with at [place]: Arna Medici  Prior Living Arrangements/Services Living arrangements for the past 2 months: Apartment Lives with:: Self Patient language and need for interpreter reviewed:: Yes Do you feel safe going back to the place  where you live?: Yes      Need for Family Participation in Patient Care: Yes (Comment) Care giver support system in place?: Yes (comment) Current home services: DME Criminal Activity/Legal Involvement Pertinent to Current Situation/Hospitalization: No - Comment as needed  Activities of Daily Living Home Assistive Devices/Equipment: Built-in shower seat, Cane (specify quad or straight), Grab bars in shower ADL Screening (condition at time of admission) Patient's cognitive ability adequate to safely complete daily activities?: Yes Is the patient deaf or have difficulty hearing?: No Does the patient have difficulty seeing, even when wearing glasses/contacts?: No Does the patient have difficulty concentrating, remembering, or making decisions?: No Patient able to express need for assistance with ADLs?: Yes Does the patient have difficulty dressing or bathing?: No Independently performs ADLs?: Yes (appropriate for developmental age) Does the patient have difficulty walking or climbing stairs?: No Weakness of Legs: None Weakness of Arms/Hands: None  Permission Sought/Granted Permission sought to share information with : Family Supports Permission granted to share information with : Yes, Verbal Permission Granted  Share Information with NAME: [name] and Tedarius Claro     Permission granted to share info w Relationship: son and daughter in law  Permission granted to share info w Contact Information: [phone] and [phone]  Emotional Assessment Appearance:: Appears stated age Attitude/Demeanor/Rapport: Engaged Affect (typically observed): Accepting Orientation: : Oriented to Self, Oriented to Place, Oriented to  Time, Oriented to Situation Alcohol / Substance Use: Not Applicable Psych Involvement: No (comment)  Admission diagnosis:  Hyperkalemia [E87.5] Uremia [N19] ESRD needing dialysis ([place]) [N18.6,  Z99.2] Patient Active Problem List   Diagnosis Date Noted  . ESRD (end stage  renal disease) ([place]) 04/01/2019  . Hyperkalemia, diminished renal excretion 04/01/2019  . Hyperlipidemia 04/13/2018  . S/P cholecystectomy 10/13/2017  . Diarrhea 05/11/2017  . Ruptured appendicitis 03/26/2017  . Marital stress 02/18/2017  . Trigger finger 06/10/2016  . BPH associated with nocturia 04/27/2016  . Diabetic peripheral neuropathy ([place])   . COPD (chronic obstructive pulmonary disease) ([place])   . Bell's palsy   . Gout 06/26/2015  . Chronic systolic CHF (congestive heart failure) ([place]) 04/08/2015  . Diverticulitis 04/08/2015  . DOE (dyspnea on exertion), due to significant CAD 03/28/2015  . Chronic kidney disease (CKD), stage IV (severe) ([place]) 03/04/2015  . Coronary artery disease involving native coronary artery of native heart with angina pectoris ([place]) 09/13/2014  . GERD (gastroesophageal reflux disease) 08/07/2012  . Depression, major, recurrent, moderate ([place]) 08/05/2012  . Sleep apnea 08/05/2012  . Controlled type 2 diabetes mellitus with stage 4 chronic kidney disease ([place]) 08/05/2012  . HTN (hypertension) 08/05/2012  . Ischemic cardiomyopathy 11/03/2011  . Atrioventricular block, complete ([place])   . Obesity   . Pacemaker    PCP:  [name], MD Pharmacy:   [facility], [address] [place] [address] Phone: [phone] Fax: [phone]     Social Determinants of Health (SDOH) Interventions    Readmission Risk Interventions No flowsheet data found.

## 2019-04-07 NOTE — Progress Notes (Signed)
PROGRESS NOTE    Rodney Farley  [id number] DOB: 1931/10/29 DOA: 04/01/2019 PCP: [name], MD   Brief Narrative: 85 year old with past medical history significant for end-stage renal disease on hemodialysis, who had recent AV fistula placed on 02/07/2019 with plan for initiation of hemodialysis when its matures.  Patient also has a history of diabetes, complete heart block status post AICD, anemia of chronic disease, COPD, diverticulosis, BPH who presents to the ER with generalized weakness and fall from bed. He was unable to get out of bed, EMS was called, in the ED he was found to have a creatinine greater than 7 BUN of 100 and potassium of 6.  Nephrology was consulted. Patient was started on hemodialysis on 9/21.  He underwent hemodialysis catheter placement.    Assessment & Plan:   Principal Problem:   ESRD (end stage renal disease) ([place]) Active Problems:   Ischemic cardiomyopathy   Obesity   Pacemaker   Depression, major, recurrent, moderate (HCC)   Controlled type 2 diabetes mellitus with stage 4 chronic kidney disease (HCC)   HTN (hypertension)   GERD (gastroesophageal reflux disease)   Chronic kidney disease (CKD), stage IV (severe) (HCC)   Diabetic peripheral neuropathy (HCC)   COPD (chronic obstructive pulmonary disease) (HCC)   Hyperlipidemia   Hyperkalemia, diminished renal excretion   1-Progressive chronic kidney disease stage V now end-stage renal disease new on hemodialysis -Patient present with uremic symptoms, BUN 100 creatinine 7.9. -Nephrology was consulted.  Had hemodialysis placed on 9/21. -He has a AV fistula which is not mature yet. -he has had 2 treatments. [facility] He will need HD tomorrow prior to discharge.   2-Hyperkalemia: Due to chronic kidney disease.  Receive Lokelma.  Resolved  3-Coronary artery disease/ischemic cardiomyopathy, ICD: Has multiple vessels coronary artery disease treated with  multiple stent in 2016, followed by Dr. [name]. Continue with aspirin, Coreg, Imdur and statin. Ejection fraction 25 to 30% with biventricular ICD. Lasix has been discontinued now that he is on hemodialysis  4-Urine retention: Foley placed in the ER 9/20, 800 cc urine retention Need to monitor with bladder scan Foley was removed.  5-COPD: Stable  Diabetes type 2: Lantus is stopped due to hypoglycemia. Monitor CBG. Leukocytosis: WBC increase to 18. Chest x ray negative for Pneumonia.  Will check blood culture and UA. Repeat labs in am.   Thrombocytopenia; improved. Monitor. Discontinue heparin    Estimated body mass index is 32.55 kg/m as calculated from the following:   Height as of this encounter: 5\' 8"  (1.727 m).   Weight as of this encounter: 97.1 kg.   DVT prophylaxis: Heparin Code Status: Full code Family Communication: No family at bedside Disposition Plan: needs to remain in the hospital for HD tomorrow, HD out patient arrange for Monday. Also need to do work up for leokocytosis.  Consultants:   Nephrology  IR  Procedures:   Hemodialysis tunnel catheter 9/21  Antimicrobials:    Subjective: He denies cough, dysuria, he denies diarrhea. He does have chronic loose stool.   Objective: Vitals:   04/06/19 2137 04/07/19 0528 04/07/19 0851 04/07/19 1200  BP: (!) 168/53 (!) 133/59 (!) 136/59   Pulse: 72 68 68   Resp: 18 19 18    Temp: 98.4 F (36.9 C) 98.4 F (36.9 C) 97.8 F (36.6 C)   TempSrc: Oral  Oral   SpO2: 100% 93% (!) 89% 95%  Weight: 97.1 kg     Height:  Intake/Output Summary (Last 24 hours) at 04/07/2019 1505 Last data filed at 04/07/2019 1100 Gross per 24 hour  Intake 1060 ml  Output 100 ml  Net 960 ml   Filed Weights   04/05/19 1825 04/05/19 2058 04/06/19 2137  Weight: 97 kg 97 kg 97.1 kg    Examination:  General exam: NAD Respiratory system: CTA Cardiovascular system: S 1, S 2 RRR Gastrointestinal system: BS present, soft, nt  Central nervous system; Alert, non focal.  Extremities: symmetric power.  Skin: Rash   Data Reviewed: I have personally reviewed following labs and imaging studies  CBC: Recent Labs  Lab 04/01/19 1322  04/04/19 0055 04/04/19 0436 04/05/19 0715 04/06/19 0500 04/07/19 0553  WBC 8.6   < > 9.0 9.5 9.4 13.0* 18.4*  NEUTROABS 6.0  --  6.7  --   --   --   --   HGB 11.7*   < > 10.6* 11.0* 10.7* 10.9* 9.6*  HCT 37.9*   < > 33.3* 34.2* 32.7* 34.0* 28.8*  MCV 98.4   < > 97.7 94.5 94.5 95.0 93.8  PLT 173   < > 164 132* 131* 88* 93*   < > = values in this interval not displayed.   Basic Metabolic Panel: Recent Labs  Lab 04/03/19 0446 04/03/19 1810 04/04/19 0056 04/04/19 0436 04/05/19 0715 04/06/19 0500  NA 142  --  141 142 143 141  K 4.9  --  4.6 3.3* 3.2* 3.2*  CL 117*  --  116* 110 112* 106  CO2 12*  --  13* 20* 19* 22  GLUCOSE 82  --  119* 82 96 114*  BUN 100*  --  102* 61* 76* 46*  CREATININE 8.02*  --  8.43* 5.69* 7.16* 5.20*  CALCIUM 7.6*  --  7.5* 7.3* 7.4* 7.6*  PHOS  --  9.5* 9.3*  --   --  3.8   GFR: Estimated Creatinine Clearance: 11.7 mL/min (A) (by C-G formula based on SCr of 5.2 mg/dL (H)). Liver Function Tests: Recent Labs  Lab 04/02/19 0413 04/04/19 0056 04/06/19 0500  AST 12*  --   --   ALT 14  --   --   ALKPHOS 102  --   --   BILITOT 0.5  --   --   PROT 6.4*  --   --   ALBUMIN 3.3* 2.7* 2.7*   No results for input(s): LIPASE, AMYLASE in the last 168 hours. No results for input(s): AMMONIA in the last 168 hours. Coagulation Profile: Recent Labs  Lab 04/03/19 0446  INR 1.3*   Cardiac Enzymes: No results for input(s): CKTOTAL, CKMB, CKMBINDEX, TROPONINI in the last 168 hours. BNP (last 3 results) No results for input(s): PROBNP in the last 8760 hours. HbA1C: No results for input(s): HGBA1C in the last 72 hours. CBG: Recent Labs  Lab 04/06/19 1125 04/06/19 1623 04/06/19 2136 04/07/19 0714 04/07/19 1109  GLUCAP 106* 103* 118* 93 108*    Lipid Profile: No results for input(s): CHOL, HDL, LDLCALC, TRIG, CHOLHDL, LDLDIRECT in the last 72 hours. Thyroid Function Tests: No results for input(s): TSH, T4TOTAL, FREET4, T3FREE, THYROIDAB in the last 72 hours. Anemia Panel: No results for input(s): VITAMINB12, FOLATE, FERRITIN, TIBC, IRON, RETICCTPCT in the last 72 hours. Sepsis Labs: No results for input(s): PROCALCITON, LATICACIDVEN in the last 168 hours.  Recent Results (from the past 240 hour(s))  SARS CORONAVIRUS 2 (TAT 6-24 HRS) Nasopharyngeal Nasopharyngeal Swab     Status: None   Collection Time:  04/01/19  8:00 PM   Specimen: Nasopharyngeal Swab  Result Value Ref Range Status   SARS Coronavirus 2 NEGATIVE NEGATIVE Final    Comment: (NOTE) SARS-CoV-2 target nucleic acids are NOT DETECTED. The SARS-CoV-2 RNA is generally detectable in upper and lower respiratory specimens during the acute phase of infection. Negative results do not preclude SARS-CoV-2 infection, do not rule out co-infections with other pathogens, and should not be used as the sole basis for treatment or other patient management decisions. Negative results must be combined with clinical observations, patient history, and epidemiological information. The expected result is Negative. Fact Sheet for Patients: SugarRoll.be Fact Sheet for Healthcare Providers: https://www.woods-mathews.com/ This test is not yet approved or cleared by the [place] FDA and  has been authorized for detection and/or diagnosis of SARS-CoV-2 by FDA under an Emergency Use Authorization (EUA). This EUA will remain  in effect (meaning this test can be used) for the duration of the COVID-19 declaration under Section 56 4(b)(1) of the Act, 21 U.S.C. section 360bbb-3(b)(1), unless the authorization is terminated or revoked sooner. Performed at [facility], [place] [address] [place], [place] [address]          Radiology  Studies: Dg Chest 2 View  Result Date: 04/07/2019 CLINICAL DATA:  Leukocytosis.  No chest complaints. EXAM: CHEST - 2 VIEW COMPARISON:  Chest x-ray dated September 27, 2017. FINDINGS: New tunneled right internal jugular dialysis catheter with tip at the cavoatrial junction. Unchanged left chest wall pacemaker. Stable mild cardiomegaly. Normal pulmonary vascularity. Trace bilateral pleural effusions. No focal consolidation or pneumothorax. No acute osseous abnormality. IMPRESSION: 1. Stable cardiomegaly with new trace bilateral pleural effusions. Electronically Signed   By: Titus Dubin M.D.   On: 04/07/2019 11:07        Scheduled Meds: . aspirin EC  81 mg Oral Daily  . atorvastatin  40 mg Oral QHS  . calcitRIOL  0.25 mcg Oral Daily  . carvedilol  12.5 mg Oral BID  . Chlorhexidine Gluconate Cloth  6 each Topical Q0600  . erythromycin  1 application Left Eye QHS  . heparin injection (subcutaneous)  5,000 Units Subcutaneous Q8H  . insulin aspart  0-9 Units Subcutaneous TID WC  . isosorbide mononitrate  15 mg Oral QHS  . pantoprazole  40 mg Oral Daily  . sertraline  150 mg Oral Daily  . sevelamer carbonate  1,600 mg Oral TID WC  . vitamin B-12  1,000 mcg Oral Daily   Continuous Infusions:   LOS: 6 days    Time spent: 35 minutes.     Elmarie Shiley, MD [facility] Pager [phone]  If 7PM-7AM, please contact night-coverage [email or web address] Password [facility] 04/07/2019, 3:05 PM

## 2019-04-08 LAB — CBC
HCT: 30.3 % — ABNORMAL LOW (ref 39.0–52.0)
Hemoglobin: 10.2 g/dL — ABNORMAL LOW (ref 13.0–17.0)
MCH: 31.2 pg (ref 26.0–34.0)
MCHC: 33.7 g/dL (ref 30.0–36.0)
MCV: 92.7 fL (ref 80.0–100.0)
Platelets: 112 10*3/uL — ABNORMAL LOW (ref 150–400)
RBC: 3.27 MIL/uL — ABNORMAL LOW (ref 4.22–5.81)
RDW: 14.4 % (ref 11.5–15.5)
WBC: 25.1 10*3/uL — ABNORMAL HIGH (ref 4.0–10.5)
nRBC: 0 % (ref 0.0–0.2)

## 2019-04-08 LAB — CBC WITH DIFFERENTIAL/PLATELET
Abs Immature Granulocytes: 0.29 10*3/uL — ABNORMAL HIGH (ref 0.00–0.07)
Basophils Absolute: 0 10*3/uL (ref 0.0–0.1)
Basophils Relative: 0 %
Eosinophils Absolute: 0.5 10*3/uL (ref 0.0–0.5)
Eosinophils Relative: 2 %
HCT: 29.9 % — ABNORMAL LOW (ref 39.0–52.0)
Hemoglobin: 10.2 g/dL — ABNORMAL LOW (ref 13.0–17.0)
Immature Granulocytes: 1 %
Lymphocytes Relative: 6 %
Lymphs Abs: 1.3 10*3/uL (ref 0.7–4.0)
MCH: 31.9 pg (ref 26.0–34.0)
MCHC: 34.1 g/dL (ref 30.0–36.0)
MCV: 93.4 fL (ref 80.0–100.0)
Monocytes Absolute: 1.7 10*3/uL — ABNORMAL HIGH (ref 0.1–1.0)
Monocytes Relative: 8 %
Neutro Abs: 16.6 10*3/uL — ABNORMAL HIGH (ref 1.7–7.7)
Neutrophils Relative %: 83 %
Platelets: 111 10*3/uL — ABNORMAL LOW (ref 150–400)
RBC: 3.2 MIL/uL — ABNORMAL LOW (ref 4.22–5.81)
RDW: 14.6 % (ref 11.5–15.5)
WBC: 20.3 10*3/uL — ABNORMAL HIGH (ref 4.0–10.5)
nRBC: 0 % (ref 0.0–0.2)

## 2019-04-08 LAB — GLUCOSE, CAPILLARY
Glucose-Capillary: 76 mg/dL (ref 70–99)
Glucose-Capillary: 121 mg/dL — ABNORMAL HIGH (ref 70–99)
Glucose-Capillary: 128 mg/dL — ABNORMAL HIGH (ref 70–99)
Glucose-Capillary: 160 mg/dL — ABNORMAL HIGH (ref 70–99)

## 2019-04-08 MED ORDER — SODIUM CHLORIDE 0.9 % IV SOLN
2.0000 g | Freq: Once | INTRAVENOUS | Status: AC
  Administered 2019-04-08: 2 g via INTRAVENOUS
  Filled 2019-04-08: qty 2

## 2019-04-08 MED ORDER — SACCHAROMYCES BOULARDII 250 MG PO CAPS: 250.0000 mg | ORAL_CAPSULE | Freq: Two times a day (BID) | ORAL | 0 refills | Status: AC

## 2019-04-08 MED ORDER — SACCHAROMYCES BOULARDII 250 MG PO CAPS
250.0000 mg | ORAL_CAPSULE | Freq: Two times a day (BID) | ORAL | Status: DC
  Administered 2019-04-08 – 2019-04-09 (×3): 250 mg via ORAL
  Filled 2019-04-08 (×3): qty 1

## 2019-04-08 MED ORDER — CALCITRIOL 0.25 MCG PO CAPS
ORAL_CAPSULE | ORAL | Status: AC
  Administered 2019-04-08: 0.25 ug via ORAL
  Filled 2019-04-08: qty 1

## 2019-04-08 MED ORDER — SODIUM CHLORIDE 0.9 % IV SOLN: 1.0000 g | INTRAVENOUS | Status: DC

## 2019-04-08 MED ORDER — HEPARIN SODIUM (PORCINE) 1000 UNIT/ML IJ SOLN
INTRAMUSCULAR | Status: AC
  Administered 2019-04-08: 10:00:00
  Filled 2019-04-08: qty 4

## 2019-04-08 MED ORDER — SODIUM CHLORIDE 0.9 % IV SOLN
1.0000 g | INTRAVENOUS | Status: DC
  Filled 2019-04-08: qty 1

## 2019-04-08 NOTE — Progress Notes (Signed)
PROGRESS NOTE    Rodney Farley  [id number] DOB: 28-Sep-1931 DOA: 04/01/2019 PCP: [name], MD   Brief Narrative: 85 year old with past medical history significant for end-stage renal disease on hemodialysis, who had recent AV fistula placed on 02/07/2019 with plan for initiation of hemodialysis when its matures.  Patient also has a history of diabetes, complete heart block status post AICD, anemia of chronic disease, COPD, diverticulosis, BPH who presents to the ER with generalized weakness and fall from bed. He was unable to get out of bed, EMS was called, in the ED he was found to have a creatinine greater than 7 BUN of 100 and potassium of 6.  Nephrology was consulted. Patient was started on hemodialysis on 9/21.  He underwent hemodialysis catheter placement.    Assessment & Plan:   Principal Problem:   ESRD (end stage renal disease) ([place]) Active Problems:   Ischemic cardiomyopathy   Obesity   Pacemaker   Depression, major, recurrent, moderate (HCC)   Controlled type 2 diabetes mellitus with stage 4 chronic kidney disease (HCC)   HTN (hypertension)   GERD (gastroesophageal reflux disease)   Chronic kidney disease (CKD), stage IV (severe) (HCC)   Diabetic peripheral neuropathy (HCC)   COPD (chronic obstructive pulmonary disease) (HCC)   Hyperlipidemia   Hyperkalemia, diminished renal excretion   1-Progressive chronic kidney disease stage V now end-stage renal disease new on hemodialysis -Patient present with uremic symptoms, BUN 100 creatinine 7.9. -Nephrology was consulted.  Had hemodialysis placed on 9/21. -He has a AV fistula which is not mature yet. -he has had 2 treatments. [facility]   2-UTI, Leukocytosis: WBC increase to 18. Chest x ray negative for Pneumonia.  Blood culture no growth to date.  UA growing more than 100,000 colonies gram negative rods.  Follow CBC if worse will change antibiotics to cefepime.    3-Coronary artery disease/ischemic cardiomyopathy, ICD: Has multiple vessels coronary artery disease treated with multiple stent in 2016, followed by Dr. [name]. Continue with aspirin, Coreg, Imdur and statin. Ejection fraction 25 to 30% with biventricular ICD. Lasix has been discontinued now that he is on hemodialysis  4-Urine retention: Foley placed in the ER 9/20, 800 cc urine retention Need to monitor with bladder scan Foley was removed.  5-COPD: Stable  Diabetes type 2: Lantus is stopped due to hypoglycemia. Monitor CBG.   Thrombocytopenia; improved. Monitor. Discontinue heparin . Improved.    Hyperkalemia: Due to chronic kidney disease.  Receive Lokelma.  Resolved   Estimated body mass index is 33.96 kg/m as calculated from the following:   Height as of this encounter: 5\' 8"  (1.727 m).   Weight as of this encounter: 101.3 kg.   DVT prophylaxis: Heparin Code Status: Full code Family Communication: Son over phone 9-26 Disposition Plan: needs to remain in the hospital for HD tomorrow, HD out patient arrange for Monday. Also need to do work up for leokocytosis.  Consultants:   Nephrology  IR  Procedures:   Hemodialysis tunnel catheter 9/21  Antimicrobials:    Subjective: He is feeling well, he is disappointed that he will be able to go home today.  His white count has increased.  Objective: Vitals:   04/08/19 0930 04/08/19 0952 04/08/19 1026 04/08/19 1031  BP: (!) 167/69 (!) 184/71 106/90 (!) 146/61  Pulse: 69 70 84 84  Resp: (!) 25 (!) 21 (!) 22   Temp:  99.7 F (37.6 C) 98.8 F (37.1 C)  TempSrc:  Oral Oral   SpO2:  96% 94% 97%  Weight:      Height:        Intake/Output Summary (Last 24 hours) at 04/08/2019 1432 Last data filed at 04/08/2019 1300 Gross per 24 hour  Intake 1000 ml  Output 1600 ml  Net -600 ml   Filed Weights   04/05/19 2058 04/06/19 2137 04/08/19 0645  Weight: 97 kg 97.1 kg 101.3 kg    Examination:  General exam: No acute  distress Respiratory system: Clear to auscultation Cardiovascular system: S1-S2 regular rhythm and rate Gastrointestinal system: Bowel sounds present, soft nontender nondistended Central nervous system; alert nonfocal Extremities: Symmetric power Skin: Rash   Data Reviewed: I have personally reviewed following labs and imaging studies  CBC: Recent Labs  Lab 04/04/19 0055 04/04/19 0436 04/05/19 0715 04/06/19 0500 04/07/19 0553 04/08/19 0433  WBC 9.0 9.5 9.4 13.0* 18.4* 20.3*  NEUTROABS 6.7  --   --   --   --  16.6*  HGB 10.6* 11.0* 10.7* 10.9* 9.6* 10.2*  HCT 33.3* 34.2* 32.7* 34.0* 28.8* 29.9*  MCV 97.7 94.5 94.5 95.0 93.8 93.4  PLT 164 132* 131* 88* 93* [id number]*   Basic Metabolic Panel: Recent Labs  Lab 04/03/19 0446 04/03/19 1810 04/04/19 0056 04/04/19 0436 04/05/19 0715 04/06/19 0500  NA 142  --  141 142 143 141  K 4.9  --  4.6 3.3* 3.2* 3.2*  CL 117*  --  116* 110 112* 106  CO2 12*  --  13* 20* 19* 22  GLUCOSE 82  --  119* 82 96 114*  BUN 100*  --  102* 61* 76* 46*  CREATININE 8.02*  --  8.43* 5.69* 7.16* 5.20*  CALCIUM 7.6*  --  7.5* 7.3* 7.4* 7.6*  PHOS  --  9.5* 9.3*  --   --  3.8   GFR: Estimated Creatinine Clearance: 12 mL/min (A) (by C-G formula based on SCr of 5.2 mg/dL (H)). Liver Function Tests: Recent Labs  Lab 04/02/19 0413 04/04/19 0056 04/06/19 0500  AST 12*  --   --   ALT 14  --   --   ALKPHOS 102  --   --   BILITOT 0.5  --   --   PROT 6.4*  --   --   ALBUMIN 3.3* 2.7* 2.7*   No results for input(s): LIPASE, AMYLASE in the last 168 hours. No results for input(s): AMMONIA in the last 168 hours. Coagulation Profile: Recent Labs  Lab 04/03/19 0446  INR 1.3*   Cardiac Enzymes: No results for input(s): CKTOTAL, CKMB, CKMBINDEX, TROPONINI in the last 168 hours. BNP (last 3 results) No results for input(s): PROBNP in the last 8760 hours. HbA1C: No results for input(s): HGBA1C in the last 72 hours. CBG: Recent Labs  Lab 04/07/19 1109  04/07/19 1616 04/07/19 2058 04/08/19 1020 04/08/19 1128  GLUCAP 108* 107* 124* 76 121*   Lipid Profile: No results for input(s): CHOL, HDL, LDLCALC, TRIG, CHOLHDL, LDLDIRECT in the last 72 hours. Thyroid Function Tests: No results for input(s): TSH, T4TOTAL, FREET4, T3FREE, THYROIDAB in the last 72 hours. Anemia Panel: No results for input(s): VITAMINB12, FOLATE, FERRITIN, TIBC, IRON, RETICCTPCT in the last 72 hours. Sepsis Labs: No results for input(s): PROCALCITON, LATICACIDVEN in the last 168 hours.  Recent Results (from the past 240 hour(s))  SARS CORONAVIRUS 2 (TAT 6-24 HRS) Nasopharyngeal Nasopharyngeal Swab     Status: None   Collection Time: 04/01/19  8:00 PM  Specimen: Nasopharyngeal Swab  Result Value Ref Range Status   SARS Coronavirus 2 NEGATIVE NEGATIVE Final    Comment: (NOTE) SARS-CoV-2 target nucleic acids are NOT DETECTED. The SARS-CoV-2 RNA is generally detectable in upper and lower respiratory specimens during the acute phase of infection. Negative results do not preclude SARS-CoV-2 infection, do not rule out co-infections with other pathogens, and should not be used as the sole basis for treatment or other patient management decisions. Negative results must be combined with clinical observations, patient history, and epidemiological information. The expected result is Negative. Fact Sheet for Patients: SugarRoll.be Fact Sheet for Healthcare Providers: https://www.woods-mathews.com/ This test is not yet approved or cleared by the [place] FDA and  has been authorized for detection and/or diagnosis of SARS-CoV-2 by FDA under an Emergency Use Authorization (EUA). This EUA will remain  in effect (meaning this test can be used) for the duration of the COVID-19 declaration under Section 56 4(b)(1) of the Act, 21 U.S.C. section 360bbb-3(b)(1), unless the authorization is terminated or revoked sooner. Performed at  [facility], [place] [address] [place], [place] [address]   Culture, blood (routine x 2)     Status: None (Preliminary result)   Collection Time: 04/07/19  4:27 PM   Specimen: BLOOD  Result Value Ref Range Status   Specimen Description BLOOD LEFT THUMB  Final   Special Requests   Final    BOTTLES DRAWN AEROBIC AND ANAEROBIC Blood Culture adequate volume   Culture   Final    NO GROWTH < 24 HOURS Performed at [facility], [place] [address] [place], [place] [address]    Report Status PENDING  Incomplete  Culture, blood (routine x 2)     Status: None (Preliminary result)   Collection Time: 04/07/19  4:34 PM   Specimen: BLOOD  Result Value Ref Range Status   Specimen Description BLOOD LEFT ANTECUBITAL  Final   Special Requests   Final    BOTTLES DRAWN AEROBIC AND ANAEROBIC Blood Culture adequate volume   Culture   Final    NO GROWTH < 24 HOURS Performed at [facility], [place] [address], [place], [place] [address]    Report Status PENDING  Incomplete  Urine Culture     Status: Abnormal (Preliminary result)   Collection Time: 04/07/19  7:31 PM   Specimen: Urine, Clean Catch  Result Value Ref Range Status   Specimen Description URINE, CLEAN CATCH  Final   Special Requests   Final    NONE Performed at [facility], [place] [address] [place], [place] [address]    Culture >=100,000 COLONIES/mL GRAM NEGATIVE RODS (A)  Final   Report Status PENDING  Incomplete         Radiology Studies: Dg Chest 2 View  Result Date: 04/07/2019 CLINICAL DATA:  Leukocytosis.  No chest complaints. EXAM: CHEST - 2 VIEW COMPARISON:  Chest x-ray dated September 27, 2017. FINDINGS: New tunneled right internal jugular dialysis catheter with tip at the cavoatrial junction. Unchanged left chest wall pacemaker. Stable mild cardiomegaly. Normal pulmonary vascularity. Trace bilateral pleural effusions. No focal consolidation or pneumothorax. No acute osseous abnormality. IMPRESSION: 1. Stable  cardiomegaly with new trace bilateral pleural effusions. Electronically Signed   By: Titus Dubin M.D.   On: 04/07/2019 11:07        Scheduled Meds: . aspirin EC  81 mg Oral Daily  . atorvastatin  40 mg Oral QHS  . calcitRIOL  0.25 mcg Oral  Daily  . carvedilol  12.5 mg Oral BID  . Chlorhexidine Gluconate Cloth  6 each Topical Q0600  . erythromycin  1 application Left Eye QHS  . insulin aspart  0-9 Units Subcutaneous TID WC  . isosorbide mononitrate  15 mg Oral QHS  . pantoprazole  40 mg Oral Daily  . saccharomyces boulardii  250 mg Oral BID  . sertraline  150 mg Oral Daily  . sevelamer carbonate  1,600 mg Oral TID WC  . vitamin B-12  1,000 mcg Oral Daily   Continuous Infusions: . cefTRIAXone (ROCEPHIN)  IV 1 g (04/07/19 2200)     LOS: 7 days    Time spent: 35 minutes.     Elmarie Shiley, MD [facility] Pager [phone]  If 7PM-7AM, please contact night-coverage [email or web address] Password [facility] 04/08/2019, 2:32 PM

## 2019-04-08 NOTE — Progress Notes (Signed)
Pharmacy Antibiotic Note  Rodney Farley is a 85 y.o. male admitted on 04/01/2019 with initiation of HD. Pharmacy has been consulted for cefepime dosing as pt was found to have GNR in UCx. Last HD session was today.  Plan: Cefepime 2g IV x1 then 1g q24h Follow HD schedule - once normalizes can dose cefepime 2g qHD Follow-up sensitivities   Height: 5\' 8"  (172.7 cm) Weight: 223 lb 5.2 oz (101.3 kg) IBW/kg (Calculated) : 68.4  Temp (24hrs), Avg:98.5 F (36.9 C), Min:97.6 F (36.4 C), Max:99.7 F (37.6 C)  Recent Labs  Lab 04/03/19 0446  04/04/19 0056 04/04/19 0436 04/05/19 0715 04/06/19 0500 04/07/19 0553 04/08/19 0433 04/08/19 1418  WBC 9.1   < >  --  9.5 9.4 13.0* 18.4* 20.3* 25.1*  CREATININE 8.02*  --  8.43* 5.69* 7.16* 5.20*  --   --   --    < > = values in this interval not displayed.    Estimated Creatinine Clearance: 12 mL/min (A) (by C-G formula based on SCr of 5.2 mg/dL (H)).    Allergies  Allergen Reactions  . Bee Venom Anaphylaxis  . Lyrica [Pregabalin] Other (See Comments)    hallucinations  . Prednisone Other (See Comments)    hallucinations  . Zocor [Simvastatin] Nausea Only and Other (See Comments)    Headache with brand name only.  Can take the generic.    Antimicrobials this admission: 9/25 Ceftriaxone x1 9/26 Cefepime >>  Microbiology results: 9/25 BCx: NGTD 9/25 UCx: >100k Pseudomonas  Thank you for allowing pharmacy to be a part of this patient's care.  Arrie Senate, PharmD, BCPS Clinical Pharmacist [phone] Please check AMION for all [place] numbers 04/08/2019

## 2019-04-08 NOTE — Progress Notes (Signed)
Occupational Therapy Treatment Patient Details Name: Rodney Farley MRN: [id number] DOB: [birth year] Today's Date: 04/08/2019    History of present illness Pt is an 85 y/o male admitted secondary to increased weakness and fall. Found to be in ESRD is s/p R IJ catheter placement. Pt started on HD on 9/21. PMH includes ischemic cariomyopathy, s/p pacemaker, DM, HTN, CKD, COPD, CAD, and s/p AV fistula placement.    OT comments  Pt. Was seen for skilled OT to increase I and safety with ADLs and mobility. Pt. Was able to perform LE ADLs at [place] A. Pt. Was Min A with sit to stand from bed and was min guard assist to AMB into bathroom with rw. Pt. Was min guard assist with sit to stand from 3-1. Pt. Required cues for proper hand placement for sit to stand and stand to sit.   Follow Up Recommendations       Equipment Recommendations       Recommendations for Other Services      Precautions / Restrictions Precautions Precautions: Fall       Mobility Bed Mobility         Supine to sit: Supervision        Transfers       Sit to Stand: Min assist(LOW BED AND POST HD. PATIENT STATED HE WAS WEAK. )         General transfer comment: MIN GUARD ASSIST WITH AMB WITH WALKER    Balance                                           ADL either performed or assessed with clinical judgement   ADL       Grooming: Wash/dry hands;Wash/dry face;Min guard;Standing               Lower Body Dressing: Minimal assistance;Sit to/from stand   Toilet Transfer: Minimal assistance;Cueing for safety           Functional mobility during ADLs: Minimal assistance(PATIENT WAS MIN A WITH SIT TO STAND FROM LOW BED. ) General ADL Comments: PATIENT WAS MIN A WITH SIT TO STAND FROM 3-1 COMMODE OVER TOILET. PATIENT WAS ABLE TO AMB TO CHAIR WITH MIN GUARD ASSIST AND WALKER.      Vision       Perception     Praxis      Cognition Arousal/Alertness:  Awake/alert Behavior During Therapy: WFL for tasks assessed/performed Overall Cognitive Status: Within Functional Limits for tasks assessed                                 General Comments: no cognitive deficts observed this session; able to state PLOF from inital eval        Exercises     Shoulder Instructions       General Comments      Pertinent Vitals/ Pain       Pain Assessment: No/denies pain  Home Living                                          Prior Functioning/Environment              Frequency  Progress Toward Goals  OT Goals(current goals can now be found in the care plan section)  Progress towards OT goals: Progressing toward goals     Plan Discharge plan remains appropriate    Co-evaluation                 AM-PAC OT "6 Clicks" Daily Activity     Outcome Measure   Help from another person eating meals?: None Help from another person taking care of personal grooming?: A Little Help from another person toileting, which includes using toliet, bedpan, or urinal?: A Little Help from another person bathing (including washing, rinsing, drying)?: A Little Help from another person to put on and taking off regular upper body clothing?: A Little Help from another person to put on and taking off regular lower body clothing?: A Little 6 Click Score: 19    End of Session Equipment Utilized During Treatment: Rolling walker      Activity Tolerance Patient tolerated treatment well   Patient Left in chair;with call bell/phone within reach;with chair alarm set   Nurse Communication (OK THERAPY)        Time: [id number] OT Time Calculation (min): 24 min  Charges: OT General Charges $OT Visit: 1 Visit OT Treatments $Self Care/Home Management : [id number] mins  6 clicks   Mickie Badders 04/08/2019, 1:51 PM

## 2019-04-08 NOTE — Progress Notes (Signed)
Renal Navigator spoke with Interim Clinic Manager/R. Bell at [facility] who states she will be able to accommodate patient starting on Monday, 04/10/19. She states he had been accepted at the [facility] clinic when he had his AVF placed, but was not to be on the schedule there until mid October when the AVF was ready for use. Now that he needs care now, Monday is the soonest she can get him on the schedule. Renal Navigator stated this would be fine. He needs to arrive at 12:00pm to complete paperwork prior to first treatment. She thinks he will be scheduled for 12:50pm MWF, but will tell him for sure on Monday at paperwork signing. Renal Navigator stated sincere appreciation. Renal Navigator notified [facility] of above plan and to cancel seat at [facility] clinic. Navigator notified Nephrologist, Primary, and bedside RN of plan. Patient will need inpt HD here on 04/08/19. Renal Navigator spoke with patient and his son, who are both understanding and very appreciative.   Alphonzo Cruise, [place] Renal Navigator [phone]

## 2019-04-08 NOTE — Plan of Care (Signed)
  Problem: Activity: Goal: Risk for activity intolerance will decrease Outcome: Progressing   

## 2019-04-08 NOTE — Plan of Care (Signed)
  Problem: Education: Goal: Knowledge of General Education information will improve Description: Including pain rating scale, medication(s)/side effects and non-pharmacologic comfort measures Outcome: Completed/Met   Problem: Health Behavior/Discharge Planning: Goal: Ability to manage health-related needs will improve Outcome: Completed/Met   Problem: Clinical Measurements: Goal: Ability to maintain clinical measurements within normal limits will improve Outcome: Completed/Met Goal: Will remain free from infection Outcome: Completed/Met Goal: Diagnostic test results will improve Outcome: Completed/Met Goal: Respiratory complications will improve Outcome: Completed/Met Goal: Cardiovascular complication will be avoided Outcome: Completed/Met   Problem: Activity: Goal: Risk for activity intolerance will decrease Outcome: Completed/Met   Problem: Nutrition: Goal: Adequate nutrition will be maintained Outcome: Completed/Met   Problem: Coping: Goal: Level of anxiety will decrease Outcome: Completed/Met   Problem: Elimination: Goal: Will not experience complications related to bowel motility Outcome: Completed/Met Goal: Will not experience complications related to urinary retention Outcome: Completed/Met   Problem: Pain Managment: Goal: General experience of comfort will improve Outcome: Completed/Met   Problem: Skin Integrity: Goal: Risk for impaired skin integrity will decrease Outcome: Completed/Met

## 2019-04-08 NOTE — Progress Notes (Addendum)
[facility] Progress Note    Assessment/ Plan:   1. Advanced CKD - w/ progression and uremic symptoms. AVF won't be ready for another 4- 6 wks.Pt admitted and will plan forinitiation of HD.   Appreciate VIRplacingTDC9/21.  HD #1 9/21. I gave him a day off and will perform HD #2 9/23; he didn't do well with HD #1 but wishes to try again before giving up on RRT.  HD #2 tolerated better 9/23  CLIP'd as well -> has a spot MWF @ [facility]     Seen on HD @ 9AM 3K 1.5L net UF goal Tolerating tx w/ no complaints.  Rt BCF placed 02/07/19 by Dr. Donzetta Matters; should be able to start new fistula protocol on 05/10/2019. Arterial limb certainly easy to palpate.   2. HTN - bp's OK, cont meds 3. Renal osteodystrophy  - started on renvela 1600 TIDM 9/23 - Renal diet 4. Anemia - @ goal 5. DM2 on insulin  6. Volume - looks euvolemic. Hold lasix for now. 7. Urine retention - may need foley and urology to follow in 3 weeks.  Subjective:   Feeling better and denies dyspnea/ f/c/n/v/cp   Objective:   BP (!) 178/56   Pulse 74   Temp 98.2 F (36.8 C) (Oral)   Resp (!) 22   Ht 5\' 8"  (1.727 m)   Wt 101.3 kg   SpO2 96%   BMI 33.96 kg/m   Intake/Output Summary (Last 24 hours) at 04/08/2019 [id number] Last data filed at 04/08/2019 0600 Gross per 24 hour  Intake 980 ml  Output 100 ml  Net 880 ml   Weight change: 4.2 kg  Physical Exam: Genalert, pleasant, elderly WM No rash, cyanosis or gangrene Chest clear bilat, no rales or wheezing, RIJ TC RRR no MRG Abd soft ntnd no mass or ascites +bs EXT traceLEedema, no wounds or ulcers Neuro is alert, Ox 3 , nf RUABCF+bruit  Imaging: Dg Chest 2 View  Result Date: 04/07/2019 CLINICAL DATA:  Leukocytosis.  No chest complaints. EXAM: CHEST - 2 VIEW COMPARISON:  Chest x-ray dated September 27, 2017. FINDINGS: New tunneled right internal jugular dialysis catheter with tip at the cavoatrial junction. Unchanged left chest  wall pacemaker. Stable mild cardiomegaly. Normal pulmonary vascularity. Trace bilateral pleural effusions. No focal consolidation or pneumothorax. No acute osseous abnormality. IMPRESSION: 1. Stable cardiomegaly with new trace bilateral pleural effusions. Electronically Signed   By: Titus Dubin M.D.   On: 04/07/2019 11:07    Labs: BMET Recent Labs  Lab 04/01/19 1322 04/02/19 0407 04/02/19 0413 04/03/19 0446 04/03/19 1810 04/04/19 0056 04/04/19 0436 04/05/19 0715 04/06/19 0500  NA 141  --  143 142  --  141 142 143 141  K 6.0*  --  5.9* 4.9  --  4.6 3.3* 3.2* 3.2*  CL 116*  --  119* 117*  --  116* 110 112* 106  CO2 12*  --  13* 12*  --  13* 20* 19* 22  GLUCOSE 100*  --  78 82  --  119* 82 96 114*  BUN 100*  --  100* 100*  --  102* 61* 76* 46*  CREATININE 7.70* 8.00* 7.84* 8.02*  --  8.43* 5.69* 7.16* 5.20*  CALCIUM 7.6*  --  7.6* 7.6*  --  7.5* 7.3* 7.4* 7.6*  PHOS  --   --   --   --  9.5* 9.3*  --   --  3.8   CBC Recent Labs  Lab 04/01/19 1322  04/04/19 0055  04/05/19 0715 04/06/19 0500 04/07/19 0553 04/08/19 0433  WBC 8.6   < > 9.0   < > 9.4 13.0* 18.4* 20.3*  NEUTROABS 6.0  --  6.7  --   --   --   --  16.6*  HGB 11.7*   < > 10.6*   < > 10.7* 10.9* 9.6* 10.2*  HCT 37.9*   < > 33.3*   < > 32.7* 34.0* 28.8* 29.9*  MCV 98.4   < > 97.7   < > 94.5 95.0 93.8 93.4  PLT 173   < > 164   < > 131* 88* 93* 111*   < > = values in this interval not displayed.    Medications:    . aspirin EC  81 mg Oral Daily  . atorvastatin  40 mg Oral QHS  . calcitRIOL  0.25 mcg Oral Daily  . carvedilol  12.5 mg Oral BID  . Chlorhexidine Gluconate Cloth  6 each Topical Q0600  . erythromycin  1 application Left Eye QHS  . insulin aspart  0-9 Units Subcutaneous TID WC  . isosorbide mononitrate  15 mg Oral QHS  . pantoprazole  40 mg Oral Daily  . sertraline  150 mg Oral Daily  . sevelamer carbonate  1,600 mg Oral TID WC  . vitamin B-12  1,000 mcg Oral Daily      Otelia Santee,  MD 04/08/2019, 9:03 AM

## 2019-04-09 ENCOUNTER — Encounter: Payer: Self-pay | Admitting: Family Medicine

## 2019-04-09 LAB — URINE CULTURE: Culture: >=100000 — AB

## 2019-04-09 LAB — CBC
HCT: 29.7 % — ABNORMAL LOW (ref 39.0–52.0)
Hemoglobin: 9.8 g/dL — ABNORMAL LOW (ref 13.0–17.0)
MCH: 30.7 pg (ref 26.0–34.0)
MCHC: 33 g/dL (ref 30.0–36.0)
MCV: 93.1 fL (ref 80.0–100.0)
Platelets: 116 K/uL — ABNORMAL LOW (ref 150–400)
RBC: 3.19 MIL/uL — ABNORMAL LOW (ref 4.22–5.81)
RDW: 14.4 % (ref 11.5–15.5)
WBC: 21.4 K/uL — ABNORMAL HIGH (ref 4.0–10.5)
nRBC: 0 % (ref 0.0–0.2)

## 2019-04-09 LAB — GLUCOSE, CAPILLARY
Glucose-Capillary: 94 mg/dL (ref 70–99)
Glucose-Capillary: 127 mg/dL — ABNORMAL HIGH (ref 70–99)

## 2019-04-09 MED ORDER — CIPROFLOXACIN HCL 250 MG PO TABS: 250.0000 mg | ORAL_TABLET | Freq: Every day | ORAL | 0 refills | Status: AC

## 2019-04-09 MED ORDER — SODIUM CHLORIDE 0.9 % IV SOLN
1.0000 g | INTRAVENOUS | Status: DC
  Administered 2019-04-09: 1 g via INTRAVENOUS
  Filled 2019-04-09: qty 1

## 2019-04-09 NOTE — TOC Transition Note (Signed)
Transition of Care [facility]) - CM/SW Discharge Note   Patient Details  Name: Rodney Farley MRN: [id number] Date of Birth: 12/21/1931  [facility]) CM/SW Contact:  Bartholomew Crews, RN Phone Number: [phone] 04/09/2019, 11:12 AM   Clinical Narrative:    Patient to transition home today. Notified [place] at [facility] with voicemail. HH orders placed 9/25. No further TOC needs identified.  Final next level of care: [place] Barriers to Discharge: No Barriers Identified   Patient Goals and CMS Choice Patient states their goals for this hospitalization and ongoing recovery are:: return home CMS Medicare.gov Compare Post Acute Care list provided to:: Patient Choice offered to / list presented to : Patient, Adult Children  Discharge Placement                       Discharge Plan and Services   Discharge Planning Services: CM Consult Post Acute Care Choice: Durable Medical Equipment, Home Health          DME Arranged: N/A DME Agency: NA       HH Arranged: PT, OT HH Agency: [facility] Date [place] Agency Contacted: 04/07/19 Time [place]: 1633 Representative spoke with at [place]: [place]) Interventions     Readmission Risk Interventions No flowsheet data found.

## 2019-04-09 NOTE — Progress Notes (Signed)
DISCHARGE NOTE HOME Rodney Farley to be discharged Home per MD order. Discussed prescriptions and follow up appointments with the patient. Prescriptions given to patient; medication list explained in detail. Patient verbalized understanding.  Skin clean, dry and intact without evidence of skin break down, no evidence of skin tears noted. IV catheter discontinued intact. Site without signs and symptoms of complications. Dressing and pressure applied. Pt denies pain at the site currently. No complaints noted.  Patient free of lines, drains, and wounds.   An After Visit Summary (AVS) was printed and given to the patient. Patient escorted via wheelchair, and discharged home via private auto.  Arlyss Repress, RN

## 2019-04-09 NOTE — Discharge Summary (Addendum)
Physician Discharge Summary  Saurav Crumble XWR:[id number] DOB: Aug 02, 1931 DOA: 04/01/2019  PCP: [name], MD  Admit date: 04/01/2019 Discharge date: 04/09/2019  Admitted From: Home Disposition:  Home  Recommendations for Outpatient Follow-up:  1. Follow up with PCP in 1-2 weeks 2. Please obtain BMP/CBC in one week 3. Follow up on resolution of UTI 4. Follow up with HD center for Dialysis.  5. Follow up with PCP for further Diabetes management.   Home Health: yes.   Discharge Condition: Stable.  CODE STATUS: Full code Diet recommendation: Heart Healthy   Brief/Interim Summary: 85 year old with past medical history significant for end-stage renal disease on hemodialysis, who had recent AV fistula placed on 02/07/2019 with plan for initiation of hemodialysis when its matures.  Patient also has a history of diabetes, complete heart block status post AICD, anemia of chronic disease, COPD, diverticulosis, BPH who presents to the ER with generalized weakness and fall from bed. He was unable to get out of bed, EMS was called, in the ED he was found to have a creatinine greater than 7 BUN of 100 and potassium of 6.  Nephrology was consulted. Patient was started on hemodialysis on 9/21.  He underwent hemodialysis catheter placement.   1-Progressive chronic kidney disease stage V now end-stage renal disease new on hemodialysis -Patient present with uremic symptoms, BUN 100 creatinine 7.9. -Nephrology was consulted.  Had hemodialysis placed on 9/21. -He has a AV fistula which is not mature yet. -he has had 2 treatments. [facility]  -he has HD schedule for 9/28  2-Pseudomonas UTI;  Patient develops leukocytosis.  Chest x ray negative for infection.  UA with more than 50 WBC. Urine growing pan-sensitive Pseudomonas.  He received 2 days of cefepime. He will be discharge on cipro for 4 days.  WBC trending down, not toxic appearing. OK to discharge  with close follow up of WBC>   3-Coronary artery disease/ischemic cardiomyopathy, ICD: Has multiple vessels coronary artery disease treated with multiple stent in 2016, followed by Dr. [name]. Continue with aspirin, Coreg, Imdur and statin. Ejection fraction 25 to 30% with biventricular ICD. Lasix has been discontinued now that he is on hemodialysis Will defer Nephrology starting ACE out patient when he is more stable with HD.   4-Urine retention: Foley placed in the ER 9/20, 800 cc urine retention Need to monitor with bladder scan Foley was removed.  5-COPD: Stable  Diabetes type 2: Lantus was stop  due to hypoglycemia. Monitor CBG. Patient has only required 2 units of short acting insulin in the last 8 days. CBG 80--160. He will be discharge off Lantus.  Advised to monitor CBG at home, if CBG increases, recommend follow up with PCP for further management (resumption lower dose lantus, 4 units, or short acting insulin PRN). Patient on HD/ ESRD are at higher risk for hypoglycemia from long acting insulin.   Leukocytosis: related to UTI, decreasing.   Thrombocytopenia; improved. Monitor. Discontinue heparin. Suspect related to infection.   Hyperkalemia: Due to chronic kidney disease.  Receive Lokelma.  Resolved    Discharge Diagnoses:  Principal Problem:   ESRD (end stage renal disease) ([place]) Active Problems:   Ischemic cardiomyopathy   Obesity   Pacemaker   Depression, major, recurrent, moderate ([place])   Controlled type 2 diabetes mellitus with stage 4 chronic kidney disease (HCC)   HTN (hypertension)   GERD (gastroesophageal reflux disease)   Chronic kidney disease (CKD), stage IV (severe) ([place])  Diabetic peripheral neuropathy (HCC)   COPD (chronic obstructive pulmonary disease) (HCC)   Hyperlipidemia   Hyperkalemia, diminished renal excretion    Discharge Instructions  Discharge Instructions    Diet - low sodium heart healthy   Complete by: As directed     Increase activity slowly   Complete by: As directed      Allergies as of 04/09/2019      Reactions   Bee Venom Anaphylaxis   Lyrica [pregabalin] Other (See Comments)   hallucinations   Prednisone Other (See Comments)   hallucinations   Zocor [simvastatin] Nausea Only, Other (See Comments)   Headache with brand name only.  Can take the generic.      Medication List    STOP taking these medications   furosemide 80 MG tablet Commonly known as: LASIX   Insulin Glargine 100 UNIT/ML Solostar Pen Commonly known as: Lantus SoloStar   Insulin Pen Needle 31G X 5 MM Misc Commonly known as: Unifine Pentips   sodium bicarbonate 650 MG tablet     TAKE these medications   aspirin EC 81 MG tablet Take 81 mg by mouth daily.   atorvastatin 40 MG tablet Commonly known as: LIPITOR Take 1 tablet (40 mg total) by mouth daily. What changed: when to take this   calcitRIOL 0.25 MCG capsule Commonly known as: ROCALTROL Take 0.25 mcg by mouth daily.   carvedilol 12.5 MG tablet Commonly known as: COREG Take 12.5 mg by mouth 2 (two) times daily.   ciprofloxacin 250 MG tablet Commonly known as: CIPRO Take 1 tablet (250 mg total) by mouth daily for 4 days.   erythromycin ophthalmic ointment Place 1 application into the left eye at bedtime.   glucose blood test strip Commonly known as: FREESTYLE TEST STRIPS Use to check blood sugar daily   glucose monitoring kit monitoring kit USE TO MONITOR BLOOD GLUCOSE AS DIRECTED   isosorbide mononitrate 30 MG 24 hr tablet Commonly known as: IMDUR Take 0.5 tablets (15 mg total) by mouth at bedtime. What changed:   medication strength  how much to take  when to take this   lansoprazole 15 MG capsule Commonly known as: PREVACID Take 1 capsule (15 mg total) by mouth daily at 12 noon.   LUBRICATING EYE DROPS OP Place 1 drop into both eyes 3 (three) times daily as needed (dry eyes).   nitroGLYCERIN 0.4 MG SL tablet Commonly known as:  NITROSTAT Place 1 tablet (0.4 mg total) under the tongue every 5 (five) minutes as needed for chest pain. 2 doses before calling for emergent help   oxymetazoline 0.05 % nasal spray Commonly known as: AFRIN Place 1 spray into both nostrils 2 (two) times daily as needed for congestion.   PROCRIT IJ Inject 1 Dose as directed every Thursday. Through Dr. Ples Specter boulardii 250 MG capsule Commonly known as: FLORASTOR Take 1 capsule (250 mg total) by mouth 2 (two) times daily.   sertraline 100 MG tablet Commonly known as: ZOLOFT TAKE 1 & 1/2 (ONE & ONE-HALF) TABLETS BY MOUTH ONCE DAILY What changed: See the new instructions.   sevelamer carbonate 800 MG tablet Commonly known as: RENVELA Take 2 tablets (1,600 mg total) by mouth 3 (three) times daily with meals.   vitamin B-12 1000 MCG tablet Commonly known as: CYANOCOBALAMIN Take 1,000 mcg by mouth daily.      Follow-up Information    [facility] Follow up.   Specialty: Home Health Services Why: Someone from [facility] will call to schedule visits for therapy! Contact information: [address] [place] [address] [id number]          Allergies  Allergen Reactions  . Bee Venom Anaphylaxis  . Lyrica [Pregabalin] Other (See Comments)    hallucinations  . Prednisone Other (See Comments)    hallucinations  . Zocor [Simvastatin] Nausea Only and Other (See Comments)    Headache with brand name only.  Can take the generic.    Consultations:  Nephrology    Procedures/Studies: Dg Chest 2 View  Result Date: 04/07/2019 CLINICAL DATA:  Leukocytosis.  No chest complaints. EXAM: CHEST - 2 VIEW COMPARISON:  Chest x-ray dated September 27, 2017. FINDINGS: New tunneled right internal jugular dialysis catheter with tip at the cavoatrial junction. Unchanged left chest wall pacemaker. Stable mild cardiomegaly. Normal pulmonary vascularity. Trace bilateral pleural effusions. No focal consolidation or  pneumothorax. No acute osseous abnormality. IMPRESSION: 1. Stable cardiomegaly with new trace bilateral pleural effusions. Electronically Signed   By: Titus Dubin M.D.   On: 04/07/2019 11:07   Ct Head Wo Contrast  Result Date: 04/01/2019 CLINICAL DATA:  Rolled out of bed. EXAM: CT HEAD WITHOUT CONTRAST TECHNIQUE: Contiguous axial images were obtained from the base of the skull through the vertex without intravenous contrast. COMPARISON:  CT head dated April 03, 2016. FINDINGS: Brain: No evidence of acute infarction, hemorrhage, hydrocephalus, extra-axial collection or mass lesion/mass effect. Stable atrophy and chronic microvascular ischemic changes. Unchanged remote lacunar infarcts in the left basal ganglia and thalamus. Vascular: Calcified atherosclerosis at the skullbase. No hyperdense vessel. Skull: Normal. Negative for fracture or focal lesion. Sinuses/Orbits: No acute finding. Postsurgical changes to both globes. Other: None. IMPRESSION: 1. No acute intracranial abnormality. Stable atrophy and chronic microvascular ischemic changes. Electronically Signed   By: Titus Dubin M.D.   On: 04/01/2019 14:26   Ir Fluoro Guide Cv Line Right  Result Date: 04/03/2019 INDICATION: 85 year old with end-stage renal disease. Patient has a maturing right upper extremity fistula and needs a tunneled dialysis catheter for hemodialysis. EXAM: FLUOROSCOPIC AND ULTRASOUND GUIDED PLACEMENT OF A TUNNELED DIALYSIS CATHETER Physician: Stephan Minister. Anselm Pancoast, MD MEDICATIONS: Ancef 2 g; The antibiotic was administered within an appropriate time interval prior to skin puncture. ANESTHESIA/SEDATION: Versed 1.0 mg IV; Fentanyl 25 mcg IV; Moderate Sedation Time:  33 minutes The patient was continuously monitored during the procedure by the interventional radiology nurse under my direct supervision. FLUOROSCOPY TIME:  Fluoroscopy Time: 3 minutes, 30 seconds, 20 mGy COMPLICATIONS: None immediate. PROCEDURE: Informed consent was  obtained for placement of a tunneled dialysis catheter. The patient was placed supine on the interventional table. Ultrasound confirmed a patent right internal jugular vein. Ultrasound image was obtained for documentation. The right side of the neck was prepped and draped in a sterile fashion. The right neck was anesthetized with 1% lidocaine. Maximal barrier sterile technique was utilized including caps, mask, sterile gowns, sterile gloves, sterile drape, hand hygiene and skin antiseptic. A small incision was made with #11 blade scalpel. A 21 gauge needle directed into the right internal jugular vein with ultrasound guidance. A micropuncture dilator set was placed. A 23 cm tip to cuff Palindrome catheter was selected. The skin below the right clavicle was anesthetized and a small incision was made with an #11 blade scalpel. A subcutaneous tunnel was formed to the vein dermatotomy site. The catheter was brought through the tunnel. The vein dermatotomy site was dilated to accommodate a peel-away sheath. The catheter was  placed through the peel-away sheath and directed into the central venous structures. The tip of the catheter was placed at the SVC and right atrium junction with fluoroscopy. Fluoroscopic images were obtained for documentation. Both lumens were found to aspirate and flush well. The proper amount of heparin was flushed in both lumens. The vein dermatotomy site was closed using a single layer of absorbable suture and Dermabond. Gel-Foam was placed in subcutaneous tract. The catheter was secured to the skin using Prolene suture. IMPRESSION: Successful placement of a right jugular tunneled dialysis catheter using ultrasound and fluoroscopic guidance. Electronically Signed   By: Markus Daft M.D.   On: 04/03/2019 10:29   Ir US Guide Vasc Access Right  Result Date: 04/03/2019 INDICATION: 85 year old with end-stage renal disease. Patient has a maturing right upper extremity fistula and needs a tunneled  dialysis catheter for hemodialysis. EXAM: FLUOROSCOPIC AND ULTRASOUND GUIDED PLACEMENT OF A TUNNELED DIALYSIS CATHETER Physician: Stephan Minister. Anselm Pancoast, MD MEDICATIONS: Ancef 2 g; The antibiotic was administered within an appropriate time interval prior to skin puncture. ANESTHESIA/SEDATION: Versed 1.0 mg IV; Fentanyl 25 mcg IV; Moderate Sedation Time:  33 minutes The patient was continuously monitored during the procedure by the interventional radiology nurse under my direct supervision. FLUOROSCOPY TIME:  Fluoroscopy Time: 3 minutes, 30 seconds, 20 mGy COMPLICATIONS: None immediate. PROCEDURE: Informed consent was obtained for placement of a tunneled dialysis catheter. The patient was placed supine on the interventional table. Ultrasound confirmed a patent right internal jugular vein. Ultrasound image was obtained for documentation. The right side of the neck was prepped and draped in a sterile fashion. The right neck was anesthetized with 1% lidocaine. Maximal barrier sterile technique was utilized including caps, mask, sterile gowns, sterile gloves, sterile drape, hand hygiene and skin antiseptic. A small incision was made with #11 blade scalpel. A 21 gauge needle directed into the right internal jugular vein with ultrasound guidance. A micropuncture dilator set was placed. A 23 cm tip to cuff Palindrome catheter was selected. The skin below the right clavicle was anesthetized and a small incision was made with an #11 blade scalpel. A subcutaneous tunnel was formed to the vein dermatotomy site. The catheter was brought through the tunnel. The vein dermatotomy site was dilated to accommodate a peel-away sheath. The catheter was placed through the peel-away sheath and directed into the central venous structures. The tip of the catheter was placed at the SVC and right atrium junction with fluoroscopy. Fluoroscopic images were obtained for documentation. Both lumens were found to aspirate and flush well. The proper amount of  heparin was flushed in both lumens. The vein dermatotomy site was closed using a single layer of absorbable suture and Dermabond. Gel-Foam was placed in subcutaneous tract. The catheter was secured to the skin using Prolene suture. IMPRESSION: Successful placement of a right jugular tunneled dialysis catheter using ultrasound and fluoroscopic guidance. Electronically Signed   By: Markus Daft M.D.   On: 04/03/2019 10:29   Vas US Duplex Dialysis Access (avf,avg)  Result Date: 03/25/2019 DIALYSIS ACCESS Reason for Exam: Routine follow up. Access Site: Right Upper Extremity. Access Type: Brachial-cephalic AVF placed 5/91/6384. Comparison Study: No prior exam Performing Technologist: Alvia Grove RVT  Examination Guidelines: A complete evaluation includes B-mode imaging, spectral Doppler, color Doppler, and power Doppler as needed of all accessible portions of each vessel. Unilateral testing is considered an integral part of a complete examination. Limited examinations for reoccurring indications may be performed as noted.  Findings: +--------------------+----------+-----------------+--------+ AVF  PSV (cm/s)Flow Vol (mL/min)Comments +--------------------+----------+-----------------+--------+ Native artery inflow   351          2674                +--------------------+----------+-----------------+--------+ AVF Anastomosis        899                              +--------------------+----------+-----------------+--------+  +-------------+----------+------------+----------+-----------------------------+ OUTFLOW VEIN PSV (cm/s)  Diameter  Depth (cm)          Describe                                       (cm)                                            +-------------+----------+------------+----------+-----------------------------+ Shoulder        204        0.68       1.40       competing branch 0.20      +-------------+----------+------------+----------+-----------------------------+ Prox UA         169        0.68       1.37                                 +-------------+----------+------------+----------+-----------------------------+ Mid to Dst UA   199        0.71       0.36     competing branch 0.46 221                                                            cm/s              +-------------+----------+------------+----------+-----------------------------+ Dist UA         125        0.91       0.36                                 +-------------+----------+------------+----------+-----------------------------+ AC Fossa        240        0.77       0.95                                 +-------------+----------+------------+----------+-----------------------------+   Summary: Patent right Brachial-cephalic fistula with increased velocity at the anastamosis at the antecubital fossa.  *See table(s) above for measurements and observations.  Diagnosing physician: Servando Snare MD Electronically signed by Servando Snare MD on 03/25/2019 at 12:19:06 AM.    --------------------------------------------------------------------------------   Final      Subjective: Feeling well, no complaints.  Does not appears septic or sick.   Discharge Exam: Vitals:   04/09/19 0412 04/09/19 0850  BP: 135/60 133/62  Pulse: 76 79  Resp: 16 (!) 21  Temp: 98.8 F (37.1 C) 98.1 F (36.7 C)  SpO2: 94% 93%     General: Pt is alert, awake, not in acute distress Cardiovascular: RRR, S1/S2 +, no rubs, no gallops Respiratory: CTA bilaterally, no wheezing, no rhonchi Abdominal: Soft, NT, ND, bowel sounds + Extremities: no edema, no cyanosis    The results of significant diagnostics from this hospitalization (including imaging, microbiology, ancillary and laboratory) are listed below for reference.     Microbiology: Recent Results (from the past 240 hour(s))  SARS CORONAVIRUS 2 (TAT 6-24  HRS) Nasopharyngeal Nasopharyngeal Swab     Status: None   Collection Time: 04/01/19  8:00 PM   Specimen: Nasopharyngeal Swab  Result Value Ref Range Status   SARS Coronavirus 2 NEGATIVE NEGATIVE Final    Comment: (NOTE) SARS-CoV-2 target nucleic acids are NOT DETECTED. The SARS-CoV-2 RNA is generally detectable in upper and lower respiratory specimens during the acute phase of infection. Negative results do not preclude SARS-CoV-2 infection, do not rule out co-infections with other pathogens, and should not be used as the sole basis for treatment or other patient management decisions. Negative results must be combined with clinical observations, patient history, and epidemiological information. The expected result is Negative. Fact Sheet for Patients: SugarRoll.be Fact Sheet for Healthcare Providers: https://www.woods-mathews.com/ This test is not yet approved or cleared by the [place] FDA and  has been authorized for detection and/or diagnosis of SARS-CoV-2 by FDA under an Emergency Use Authorization (EUA). This EUA will remain  in effect (meaning this test can be used) for the duration of the COVID-19 declaration under Section 56 4(b)(1) of the Act, 21 U.S.C. section 360bbb-3(b)(1), unless the authorization is terminated or revoked sooner. Performed at [facility], [place] [address], [place], [place] [address]   Culture, blood (routine x 2)     Status: None (Preliminary result)   Collection Time: 04/07/19  4:27 PM   Specimen: BLOOD  Result Value Ref Range Status   Specimen Description BLOOD LEFT THUMB  Final   Special Requests   Final    BOTTLES DRAWN AEROBIC AND ANAEROBIC Blood Culture adequate volume   Culture   Final    NO GROWTH < 24 HOURS Performed at [facility], [place] [address], [place], [place] [address]    Report Status PENDING  Incomplete  Culture, blood (routine x 2)     Status: None (Preliminary result)    Collection Time: 04/07/19  4:34 PM   Specimen: BLOOD  Result Value Ref Range Status   Specimen Description BLOOD LEFT ANTECUBITAL  Final   Special Requests   Final    BOTTLES DRAWN AEROBIC AND ANAEROBIC Blood Culture adequate volume   Culture   Final    NO GROWTH < 24 HOURS Performed at [facility], [place] [address], [place], [place] [address]    Report Status PENDING  Incomplete  Urine Culture     Status: Abnormal   Collection Time: 04/07/19  7:31 PM   Specimen: Urine, Clean Catch  Result Value Ref Range Status   Specimen Description URINE, CLEAN CATCH  Final   Special Requests   Final    NONE Performed at [facility], [place] [address] [place], [place] [address]    Culture >=100,000 COLONIES/mL PSEUDOMONAS AERUGINOSA (A)  Final   Report Status 04/09/2019 FINAL  Final   Organism ID, Bacteria PSEUDOMONAS AERUGINOSA (A)  Final      Susceptibility   Pseudomonas aeruginosa - MIC*    CEFTAZIDIME 4 SENSITIVE Sensitive  CIPROFLOXACIN <=0.25 SENSITIVE Sensitive     GENTAMICIN 2 SENSITIVE Sensitive     IMIPENEM 2 SENSITIVE Sensitive     PIP/TAZO 8 SENSITIVE Sensitive     CEFEPIME 2 SENSITIVE Sensitive     * >=100,000 COLONIES/mL PSEUDOMONAS AERUGINOSA     Labs: BNP (last 3 results) No results for input(s): BNP in the last 8760 hours. Basic Metabolic Panel: Recent Labs  Lab 04/03/19 0446 04/03/19 1810 04/04/19 0056 04/04/19 0436 04/05/19 0715 04/06/19 0500  NA 142  --  141 142 143 141  K 4.9  --  4.6 3.3* 3.2* 3.2*  CL 117*  --  116* 110 112* 106  CO2 12*  --  13* 20* 19* 22  GLUCOSE 82  --  119* 82 96 114*  BUN 100*  --  102* 61* 76* 46*  CREATININE 8.02*  --  8.43* 5.69* 7.16* 5.20*  CALCIUM 7.6*  --  7.5* 7.3* 7.4* 7.6*  PHOS  --  9.5* 9.3*  --   --  3.8   Liver Function Tests: Recent Labs  Lab 04/04/19 0056 04/06/19 0500  ALBUMIN 2.7* 2.7*   No results for input(s): LIPASE, AMYLASE in the last 168 hours. No results for input(s): AMMONIA in  the last 168 hours. CBC: Recent Labs  Lab 04/04/19 0055  04/06/19 0500 04/07/19 0553 04/08/19 0433 04/08/19 1418 04/09/19 0536  WBC 9.0   < > 13.0* 18.4* 20.3* 25.1* 21.4*  NEUTROABS 6.7  --   --   --  16.6*  --   --   HGB 10.6*   < > 10.9* 9.6* 10.2* 10.2* 9.8*  HCT 33.3*   < > 34.0* 28.8* 29.9* 30.3* 29.7*  MCV 97.7   < > 95.0 93.8 93.4 92.7 93.1  PLT 164   < > 88* 93* 111* 112* 116*   < > = values in this interval not displayed.   Cardiac Enzymes: No results for input(s): CKTOTAL, CKMB, CKMBINDEX, TROPONINI in the last 168 hours. BNP: Invalid input(s): POCBNP CBG: Recent Labs  Lab 04/08/19 1020 04/08/19 1128 04/08/19 1615 04/08/19 2153 04/09/19 0706  GLUCAP 76 121* 128* 160* 94   D-Dimer No results for input(s): DDIMER in the last 72 hours. Hgb A1c No results for input(s): HGBA1C in the last 72 hours. Lipid Profile No results for input(s): CHOL, HDL, LDLCALC, TRIG, CHOLHDL, LDLDIRECT in the last 72 hours. Thyroid function studies No results for input(s): TSH, T4TOTAL, T3FREE, THYROIDAB in the last 72 hours.  Invalid input(s): FREET3 Anemia work up No results for input(s): VITAMINB12, FOLATE, FERRITIN, TIBC, IRON, RETICCTPCT in the last 72 hours. Urinalysis    Component Value Date/Time   COLORURINE YELLOW 04/07/2019 2001   APPEARANCEUR CLOUDY (A) 04/07/2019 2001   LABSPEC 1.015 04/07/2019 2001   PHURINE 5.0 04/07/2019 2001   GLUCOSEU 50 (A) 04/07/2019 2001   GLUCOSEU NEGATIVE 03/25/2017 1309   HGBUR MODERATE (A) 04/07/2019 2001   BILIRUBINUR NEGATIVE 04/07/2019 2001   BILIRUBINUR negative 04/04/2015 1229   BILIRUBINUR neg 02/27/2015 [place] 04/07/2019 2001   PROTEINUR >=300 (A) 04/07/2019 2001   UROBILINOGEN 0.2 03/25/2017 1309   NITRITE NEGATIVE 04/07/2019 2001   LEUKOCYTESUR LARGE (A) 04/07/2019 2001   Sepsis Labs Invalid input(s): PROCALCITONIN,  WBC,  LACTICIDVEN Microbiology Recent Results (from the past 240 hour(s))  SARS  CORONAVIRUS 2 (TAT 6-24 HRS) Nasopharyngeal Nasopharyngeal Swab     Status: None   Collection Time: 04/01/19  8:00 PM   Specimen: Nasopharyngeal Swab  Result Value Ref Range Status   SARS Coronavirus 2 NEGATIVE NEGATIVE Final    Comment: (NOTE) SARS-CoV-2 target nucleic acids are NOT DETECTED. The SARS-CoV-2 RNA is generally detectable in upper and lower respiratory specimens during the acute phase of infection. Negative results do not preclude SARS-CoV-2 infection, do not rule out co-infections with other pathogens, and should not be used as the sole basis for treatment or other patient management decisions. Negative results must be combined with clinical observations, patient history, and epidemiological information. The expected result is Negative. Fact Sheet for Patients: SugarRoll.be Fact Sheet for Healthcare Providers: https://www.woods-mathews.com/ This test is not yet approved or cleared by the [place] FDA and  has been authorized for detection and/or diagnosis of SARS-CoV-2 by FDA under an Emergency Use Authorization (EUA). This EUA will remain  in effect (meaning this test can be used) for the duration of the COVID-19 declaration under Section 56 4(b)(1) of the Act, 21 U.S.C. section 360bbb-3(b)(1), unless the authorization is terminated or revoked sooner. Performed at [facility], [place] [address] [place], [place] [address]   Culture, blood (routine x 2)     Status: None (Preliminary result)   Collection Time: 04/07/19  4:27 PM   Specimen: BLOOD  Result Value Ref Range Status   Specimen Description BLOOD LEFT THUMB  Final   Special Requests   Final    BOTTLES DRAWN AEROBIC AND ANAEROBIC Blood Culture adequate volume   Culture   Final    NO GROWTH < 24 HOURS Performed at [facility], [place] [address], [place], [place] [address]    Report Status PENDING  Incomplete  Culture, blood (routine x 2)     Status:  None (Preliminary result)   Collection Time: 04/07/19  4:34 PM   Specimen: BLOOD  Result Value Ref Range Status   Specimen Description BLOOD LEFT ANTECUBITAL  Final   Special Requests   Final    BOTTLES DRAWN AEROBIC AND ANAEROBIC Blood Culture adequate volume   Culture   Final    NO GROWTH < 24 HOURS Performed at [facility], [place] [address], [place], [place] [address]    Report Status PENDING  Incomplete  Urine Culture     Status: Abnormal   Collection Time: 04/07/19  7:31 PM   Specimen: Urine, Clean Catch  Result Value Ref Range Status   Specimen Description URINE, CLEAN CATCH  Final   Special Requests   Final    NONE Performed at [facility], [place] [address], [place], [place] [address]    Culture >=100,000 COLONIES/mL PSEUDOMONAS AERUGINOSA (A)  Final   Report Status 04/09/2019 FINAL  Final   Organism ID, Bacteria PSEUDOMONAS AERUGINOSA (A)  Final      Susceptibility   Pseudomonas aeruginosa - MIC*    CEFTAZIDIME 4 SENSITIVE Sensitive     CIPROFLOXACIN <=0.25 SENSITIVE Sensitive     GENTAMICIN 2 SENSITIVE Sensitive     IMIPENEM 2 SENSITIVE Sensitive     PIP/TAZO 8 SENSITIVE Sensitive     CEFEPIME 2 SENSITIVE Sensitive     * >=100,000 COLONIES/mL PSEUDOMONAS AERUGINOSA     Time coordinating discharge: 40 minutes  SIGNED:   Elmarie Shiley, MD  [facility]

## 2019-04-09 NOTE — Progress Notes (Signed)
[facility] Progress Note    Assessment/ Plan:   1. Advanced CKD - w/ progression and uremic symptoms. AVF won't be ready for another 4- 6 wks.Pt admitted and will plan forinitiation of HD.   Appreciate VIRplacingTDC9/21.  HD #1 9/21. I gave him a day off and will perform HD #2 9/23; he didn't do well with HD #1 but wishes to try again before giving up on RRT.  HD #2 tolerated better 9/23  CLIP'd as well->has a spot MWF @ [facility] ([facility]).    Last HD Sat (#3) Tolerated tx w/ no complaints.  Rt BCF placed 02/07/19 by Dr. Donzetta Matters; should be able to start new fistula protocol on 05/10/2019. Arterial limb certainly easy to palpate.   2. HTN - bp's OK, cont meds 3. Renal osteodystrophy  - started on renvela 1600 TIDM 9/23 - Renal diet 4. Anemia - @ goal 5. DM2 on insulin  6. Volume - looks euvolemic. Hold lasix for now. 7. Urine retention - -> pseudomonas pansens being treated with cefepime x1 and then outpt cipro..  Subjective:   Feeling better and denies dyspnea/ f/c/n/v/cp Pseudomonas in urine; WBC better   Objective:   BP 133/62 (BP Location: Left Arm)   Pulse 79   Temp 98.1 F (36.7 C) (Oral)   Resp (!) 21   Ht 5\' 8"  (1.727 m)   Wt 95 kg   SpO2 93%   BMI 31.84 kg/m   Intake/Output Summary (Last 24 hours) at 04/09/2019 [id number] Last data filed at 04/09/2019 0800 Gross per 24 hour  Intake 799.87 ml  Output 1500 ml  Net -700.13 ml   Weight change: -6.3 kg  Physical Exam: Genalert, pleasant, elderly WM No rash, cyanosis or gangrene Chest clear bilat, no rales or wheezing, RIJ TC RRR no MRG Abd soft ntnd no mass or ascites +bs EXT traceLEedema, no wounds or ulcers Neuro is alert, Ox 3 , nf RUABCF+bruit  Imaging: Dg Chest 2 View  Result Date: 04/07/2019 CLINICAL DATA:  Leukocytosis.  No chest complaints. EXAM: CHEST - 2 VIEW COMPARISON:  Chest x-ray dated September 27, 2017. FINDINGS: New tunneled right internal jugular  dialysis catheter with tip at the cavoatrial junction. Unchanged left chest wall pacemaker. Stable mild cardiomegaly. Normal pulmonary vascularity. Trace bilateral pleural effusions. No focal consolidation or pneumothorax. No acute osseous abnormality. IMPRESSION: 1. Stable cardiomegaly with new trace bilateral pleural effusions. Electronically Signed   By: Titus Dubin M.D.   On: 04/07/2019 11:07    Labs: BMET Recent Labs  Lab 04/03/19 0446 04/03/19 1810 04/04/19 0056 04/04/19 0436 04/05/19 0715 04/06/19 0500  NA 142  --  141 142 143 141  K 4.9  --  4.6 3.3* 3.2* 3.2*  CL 117*  --  116* 110 112* 106  CO2 12*  --  13* 20* 19* 22  GLUCOSE 82  --  119* 82 96 114*  BUN 100*  --  102* 61* 76* 46*  CREATININE 8.02*  --  8.43* 5.69* 7.16* 5.20*  CALCIUM 7.6*  --  7.5* 7.3* 7.4* 7.6*  PHOS  --  9.5* 9.3*  --   --  3.8   CBC Recent Labs  Lab 04/04/19 0055  04/07/19 0553 04/08/19 0433 04/08/19 1418 04/09/19 0536  WBC 9.0   < > 18.4* 20.3* 25.1* 21.4*  NEUTROABS 6.7  --   --  16.6*  --   --   HGB 10.6*   < > 9.6* 10.2* 10.2* 9.8*  HCT  33.3*   < > 28.8* 29.9* 30.3* 29.7*  MCV 97.7   < > 93.8 93.4 92.7 93.1  PLT 164   < > 93* 111* 112* 116*   < > = values in this interval not displayed.    Medications:    . aspirin EC  81 mg Oral Daily  . atorvastatin  40 mg Oral QHS  . calcitRIOL  0.25 mcg Oral Daily  . carvedilol  12.5 mg Oral BID  . Chlorhexidine Gluconate Cloth  6 each Topical Q0600  . erythromycin  1 application Left Eye QHS  . insulin aspart  0-9 Units Subcutaneous TID WC  . isosorbide mononitrate  15 mg Oral QHS  . pantoprazole  40 mg Oral Daily  . saccharomyces boulardii  250 mg Oral BID  . sertraline  150 mg Oral Daily  . sevelamer carbonate  1,600 mg Oral TID WC  . vitamin B-12  1,000 mcg Oral Daily      Otelia Santee, MD 04/09/2019, 9:39 AM

## 2019-04-09 NOTE — Plan of Care (Signed)
  Problem: Activity: Goal: Risk for activity intolerance will decrease Outcome: Progressing   

## 2019-04-10 DIAGNOSIS — D509 Iron deficiency anemia, unspecified: Secondary | ICD-10-CM | POA: Insufficient documentation

## 2019-04-10 DIAGNOSIS — T829XXA Unspecified complication of cardiac and vascular prosthetic device, implant and graft, initial encounter: Secondary | ICD-10-CM | POA: Insufficient documentation

## 2019-04-10 DIAGNOSIS — Z992 Dependence on renal dialysis: Secondary | ICD-10-CM | POA: Diagnosis not present

## 2019-04-10 DIAGNOSIS — N2581 Secondary hyperparathyroidism of renal origin: Secondary | ICD-10-CM | POA: Diagnosis not present

## 2019-04-10 DIAGNOSIS — D631 Anemia in chronic kidney disease: Secondary | ICD-10-CM | POA: Insufficient documentation

## 2019-04-10 DIAGNOSIS — Z111 Encounter for screening for respiratory tuberculosis: Secondary | ICD-10-CM | POA: Diagnosis not present

## 2019-04-10 DIAGNOSIS — T782XXA Anaphylactic shock, unspecified, initial encounter: Secondary | ICD-10-CM | POA: Insufficient documentation

## 2019-04-10 DIAGNOSIS — N186 End stage renal disease: Secondary | ICD-10-CM | POA: Diagnosis not present

## 2019-04-10 DIAGNOSIS — T8249XA Other complication of vascular dialysis catheter, initial encounter: Secondary | ICD-10-CM | POA: Diagnosis not present

## 2019-04-11 ENCOUNTER — Telehealth: Payer: Self-pay

## 2019-04-11 DIAGNOSIS — Z992 Dependence on renal dialysis: Secondary | ICD-10-CM | POA: Diagnosis not present

## 2019-04-11 DIAGNOSIS — R351 Nocturia: Secondary | ICD-10-CM | POA: Diagnosis not present

## 2019-04-11 DIAGNOSIS — N401 Enlarged prostate with lower urinary tract symptoms: Secondary | ICD-10-CM | POA: Diagnosis not present

## 2019-04-11 DIAGNOSIS — I2510 Atherosclerotic heart disease of native coronary artery without angina pectoris: Secondary | ICD-10-CM | POA: Diagnosis not present

## 2019-04-11 DIAGNOSIS — E1142 Type 2 diabetes mellitus with diabetic polyneuropathy: Secondary | ICD-10-CM | POA: Diagnosis not present

## 2019-04-11 DIAGNOSIS — Z9181 History of falling: Secondary | ICD-10-CM | POA: Diagnosis not present

## 2019-04-11 DIAGNOSIS — K219 Gastro-esophageal reflux disease without esophagitis: Secondary | ICD-10-CM | POA: Diagnosis not present

## 2019-04-11 DIAGNOSIS — M109 Gout, unspecified: Secondary | ICD-10-CM | POA: Diagnosis not present

## 2019-04-11 DIAGNOSIS — Z87891 Personal history of nicotine dependence: Secondary | ICD-10-CM | POA: Diagnosis not present

## 2019-04-11 DIAGNOSIS — F331 Major depressive disorder, recurrent, moderate: Secondary | ICD-10-CM | POA: Diagnosis not present

## 2019-04-11 DIAGNOSIS — Z7982 Long term (current) use of aspirin: Secondary | ICD-10-CM | POA: Diagnosis not present

## 2019-04-11 DIAGNOSIS — J449 Chronic obstructive pulmonary disease, unspecified: Secondary | ICD-10-CM | POA: Diagnosis not present

## 2019-04-11 DIAGNOSIS — G4730 Sleep apnea, unspecified: Secondary | ICD-10-CM | POA: Diagnosis not present

## 2019-04-11 DIAGNOSIS — I255 Ischemic cardiomyopathy: Secondary | ICD-10-CM | POA: Diagnosis not present

## 2019-04-11 DIAGNOSIS — I442 Atrioventricular block, complete: Secondary | ICD-10-CM | POA: Diagnosis not present

## 2019-04-11 DIAGNOSIS — E1122 Type 2 diabetes mellitus with diabetic chronic kidney disease: Secondary | ICD-10-CM | POA: Diagnosis not present

## 2019-04-11 DIAGNOSIS — N186 End stage renal disease: Secondary | ICD-10-CM | POA: Diagnosis not present

## 2019-04-11 DIAGNOSIS — I5022 Chronic systolic (congestive) heart failure: Secondary | ICD-10-CM | POA: Diagnosis not present

## 2019-04-11 DIAGNOSIS — I132 Hypertensive heart and chronic kidney disease with heart failure and with stage 5 chronic kidney disease, or end stage renal disease: Secondary | ICD-10-CM | POA: Diagnosis not present

## 2019-04-11 NOTE — Telephone Encounter (Signed)
Per Chart Review:  Admit date: 04/01/2019 Discharge date: 04/09/2019  Admitted From: Home Disposition:  Home  Recommendations for Outpatient Follow-up:  1. Follow up with PCP in 1-2 weeks 2. Please obtain BMP/CBC in one week 3. Follow up on resolution of UTI 4. Follow up with HD center for Dialysis.  5. Follow up with PCP for further Diabetes management.   Home Health: yes.   Discharge Condition: Stable.  CODE STATUS: Full code Diet recommendation: Heart Healthy   Brief/Interim Summary: 85 year old with past medical history significant for end-stage renal disease on hemodialysis, who had recent AV fistula placed on 02/07/2019 with plan for initiation of hemodialysis when its matures. Patient also has a history of diabetes, complete heart block status post AICD, anemia of chronic disease, COPD, diverticulosis, BPH who presents to the ER with generalized weakness and fall from bed. He was unable to get out of bed, EMS was called, in the ED he was found to have a creatinine greater than 7 BUN of 100 and potassium of 6. Nephrology was consulted. Patient was started on hemodialysis on 9/21. He underwent hemodialysis catheter placement.   1-Progressive chronic kidney disease stage Vnow end-stage renal disease new on hemodialysis -Patient present with uremic symptoms, BUN 100 creatinine 7.9. -Nephrology was consulted. Had hemodialysis placed on 9/21. -He has a AV fistula which is not mature yet. -he has had 2 treatments. [facility]  -he has HD schedule for 9/28  2-Pseudomonas UTI;  Patient develops leukocytosis.  Chest x ray negative for infection.  UA with more than 50 WBC. Urine growing pan-sensitive Pseudomonas.  He received 2 days of cefepime. He will be discharge on cipro for 4 days.  WBC trending down, not toxic appearing. OK to discharge with close follow up of WBC>   3-Coronary artery disease/ischemic cardiomyopathy, ICD: Has  multiple vessels coronary artery disease treated with multiple stent in 2016, followed by Dr. [name]. Continue with aspirin, Coreg, Imdur and statin. Ejection fraction 25 to 30% with biventricular ICD. Lasix has been discontinued now that he is on hemodialysis Will defer Nephrology starting ACE out patient when he is more stable with HD.   4-Urine retention:Foley placed in the ER 9/20, 800 cc urine retention Need to monitor with bladder scan Foley was removed.  5-COPD: Stable  Diabetes type 2: Lantus was stop  due to hypoglycemia. Monitor CBG. Patient has only required 2 units of short acting insulin in the last 8 days. CBG 80--160. He will be discharge off Lantus.  Advised to monitor CBG at home, if CBG increases, recommend follow up with PCP for further management (resumption lower dose lantus, 4 units, or short acting insulin PRN). Patient on HD/ ESRD are at higher risk for hypoglycemia from long acting insulin.   Leukocytosis:related to UTI, decreasing.   Thrombocytopenia; improved. Monitor. Discontinue heparin. Suspect related to infection.   Hyperkalemia: Due to chronic kidney disease. Receive Lokelma. Resolved  ______________________________________________________________________  Per Telephone Call:  Transition Care Management Follow-up Telephone Call   Date discharged?  04/09/2019   How have you been since you were released from the hospital? [facility]   Do you understand why you were in the hospital? yes   Do you understand the discharge instructions? yes   Where were you discharged to? Home   Items Reviewed:  Medications reviewed: yes  Allergies reviewed: yes  Dietary changes reviewed: yes  Referrals reviewed: yes   Functional Questionnaire:   Activities of Daily Living (  ADLs):   He states they are independent in the following: ambulation, bathing and hygiene, feeding, continence, grooming, toileting and dressing States they require assistance  with the following: None   Any transportation issues/concerns?: no   Any patient concerns? no   Confirmed importance and date/time of follow-up visits scheduled yes  Provider Appointment booked with Dr. Yong Channel on 04/13/2019 @ 3:40 pm  Confirmed with patient if condition begins to worsen call PCP or go to the ER.  Patient was given the office number and encouraged to call back with question or concerns.  : yes

## 2019-04-11 NOTE — Telephone Encounter (Signed)
Noted thanks °

## 2019-04-12 DIAGNOSIS — N186 End stage renal disease: Secondary | ICD-10-CM | POA: Diagnosis not present

## 2019-04-12 DIAGNOSIS — E1129 Type 2 diabetes mellitus with other diabetic kidney complication: Secondary | ICD-10-CM | POA: Diagnosis not present

## 2019-04-12 DIAGNOSIS — Z992 Dependence on renal dialysis: Secondary | ICD-10-CM | POA: Diagnosis not present

## 2019-04-12 DIAGNOSIS — E876 Hypokalemia: Secondary | ICD-10-CM | POA: Insufficient documentation

## 2019-04-12 LAB — CULTURE, BLOOD (ROUTINE X 2)
Culture: NO GROWTH
Culture: NO GROWTH
Special Requests: ADEQUATE
Special Requests: ADEQUATE

## 2019-04-12 NOTE — Progress Notes (Addendum)
Phone [phone]   Subjective:  Rodney Farley is a 85 y.o. year old very pleasant male patient who presents for transitional care management and hospital follow up for generalized weakness related to end-stage renal disease with dialysis initiated during hospitalization. Patient was hospitalized from April 01, 2019 to April 09, 2019. A TCM phone call was completed on April 11, 2019. Medical complexity: High- permanently discontinuing Lasix and Lantus at this point.  Complex hospitalization summarized below  Patient is a 85 year old male who presented to the hospital with generalized weakness- he had fallen out of his bed and was unable to get up without support- he has known end-stage renal disease and had a recent AV fistula placed on February 07, 2019 with plan to initiate hemodialysis when that matured.  Patient went to the hospital because he was unable to get out of bed and EMS had to be called.  Creatinine in the emergency room was up to 7.9 BUN over 100 and potassium of 6.  Nephrology was consulted and patient had a hemodialysis catheter placed.  Patient was started on dialysis during hospitalization and had 2 treatments.  He was accepted at the [facility] hemodialysis center.  He is on Monday Wednesday Friday schedule . Taken off calcitriol as of yesterday  Patient also developed leukocytosis while hospitalized.  Chest x-ray was negative for infection.  UA with leukocytes noted.  Urine culture grew pansensitive Pseudomonas-patient was treated with 2 days of cefepime and discharged on ciprofloxacin for 4 more days.  White blood cells trended down after hospitalization with plan for outpatient follow-up.  Patient also has known coronary artery disease and ischemic cardiomyopathy with ICD in place.  Patient with stents in 2016.  Patient was continued on aspirin, Coreg, Imdur, statin.  Lasix was discontinued since patient is now on hemodialysis.  We will have to monitor  blood pressure-apparently blood pressure got low during dialysis yesterday  Patient did experience some urinary retention- had 800 cc urine retention during hospitalization and required Foley catheter- this was removed before discharge-he has had decreased urination since stopping Lasix being on dialysis  Dysuria after d/c- even on antibiotics- not getting worse. Urinating small volumes.  He is unable to produce urine sample today for testing  COPD was stable during hospitalization  Diabetes type 2-Lantus was stopped due to hypoglycemia-patient required minimal insulin only 2 units during 8-day.  Before discharge.  He has been monitoring his blood sugars at home  - rangingfrom 89-134 in Am. He has done some evening sugar checks which range from 151-191. We discussed remaining off insulin for now and targetting an a1c 8.5 or less.  Lab Results  Component Value Date   HGBA1C 5.7 (H) 04/02/2019   HGBA1C 5.3 08/03/2018   HGBA1C 5.3 04/13/2018   Patient also had thrombocytopenia noted-this improved during course of hospitalization-we will update today Lab Results  Component Value Date   WBC 21.4 (H) 04/09/2019   HGB 9.8 (L) 04/09/2019   HCT 29.7 (L) 04/09/2019   MCV 93.1 04/09/2019   PLT 116 (L) 04/09/2019   At home he is working with physical therapy to try to get stronger- he would like to be able to get himself off the floor if needed. Occupational thearpy thought he was in safe environment.   Hospitalist requested BMP/CBC.  We will get CBC/CMP.  We will also check another urine culture. Since dialysis he is not urinating anymore. He is not having bowel movements as regularly. Appetite is low. Does not  feel well overall.   I independently reviewed chest x-ray from 04/07/2019-cardiomegaly noted along with trace bilateral pleural effusions.  No obvious pneumonia was noted.  Presence of pacemaker noted.  Some pulmonary edema noted    See problem oriented charting as well ROS-no fever/chills.   Does have fatigue related dialysis.  Feels discouraged related to having to do dialysis.  Still with some generalized weakness-trying to improve this with physical therapy.  Stable baseline shortness of breath.  No chest pain reported.  Past Medical History-  Patient Active Problem List   Diagnosis Date Noted   Ruptured appendicitis 03/26/2017    Priority: High   Marital stress 02/18/2017    Priority: High   COPD (chronic obstructive pulmonary disease) ([place])     Priority: High   Chronic systolic CHF (congestive heart failure) ([place]) 04/08/2015    Priority: High   DOE (dyspnea on exertion), due to significant CAD 03/28/2015    Priority: High   ESRD on hemodialysis ([place]) 03/04/2015    Priority: High   Coronary artery disease involving native coronary artery of native heart with angina pectoris ([place]) 09/13/2014    Priority: High   Depression, major, recurrent, moderate ([place]) 08/05/2012    Priority: High   Controlled type 2 diabetes mellitus with chronic kidney disease on chronic dialysis ([place]) 08/05/2012    Priority: High   Ischemic cardiomyopathy 11/03/2011    Priority: High   Atrioventricular block, complete ([place])     Priority: High   Pacemaker     Priority: High   Hyperlipidemia associated with type 2 diabetes mellitus ([place]) 04/13/2018    Priority: Medium   BPH associated with nocturia 04/27/2016    Priority: Medium   Diabetic peripheral neuropathy ([place])     Priority: Medium   Bell's palsy     Priority: Medium   Gout 06/26/2015    Priority: Medium   Sleep apnea 08/05/2012    Priority: Medium   Hypertension associated with diabetes ([place]) 08/05/2012    Priority: Medium   S/P cholecystectomy 10/13/2017    Priority: Low   Trigger finger 06/10/2016    Priority: Low   Diverticulitis 04/08/2015    Priority: Low   GERD (gastroesophageal reflux disease) 08/07/2012    Priority: Low   Obesity     Priority: Low   Thrombocytopenia ([place]) 04/13/2019    Hyperkalemia, diminished renal excretion 04/01/2019   Diarrhea 05/11/2017    Medications- reviewed and updated  A medical reconciliation was performed comparing current medicines to hospital discharge medications. Current Outpatient Medications  Medication Sig Dispense Refill   aspirin EC 81 MG tablet Take 81 mg by mouth daily.     atorvastatin (LIPITOR) 40 MG tablet Take 1 tablet (40 mg total) by mouth daily. (Patient taking differently: Take 40 mg by mouth at bedtime. ) 90 tablet 3   Carboxymethylcellul-Glycerin (LUBRICATING EYE DROPS OP) Place 1 drop into both eyes 3 (three) times daily as needed (dry eyes).     carvedilol (COREG) 12.5 MG tablet Take 12.5 mg by mouth 2 (two) times daily.     ciprofloxacin (CIPRO) 250 MG tablet Take 1 tablet (250 mg total) by mouth daily for 4 days. 4 tablet 0   Epoetin Alfa (PROCRIT IJ) Inject 1 Dose as directed every Thursday. Through Dr. Justin Mend      erythromycin ophthalmic ointment Place 1 application into the left eye at bedtime.     glucose blood (FREESTYLE TEST STRIPS) test strip Use to check blood sugar daily 100 each  3   glucose monitoring kit (FREESTYLE) monitoring kit USE TO MONITOR BLOOD GLUCOSE AS DIRECTED 1 each 1   isosorbide mononitrate (IMDUR) 30 MG 24 hr tablet Take 0.5 tablets (15 mg total) by mouth at bedtime. 30 tablet 0   lansoprazole (PREVACID) 15 MG capsule Take 1 capsule (15 mg total) by mouth daily at 12 noon. 90 capsule 3   nitroGLYCERIN (NITROSTAT) 0.4 MG SL tablet Place 1 tablet (0.4 mg total) under the tongue every 5 (five) minutes as needed for chest pain. 2 doses before calling for emergent help 25 tablet 3   oxymetazoline (AFRIN) 0.05 % nasal spray Place 1 spray into both nostrils 2 (two) times daily as needed for congestion.     saccharomyces boulardii (FLORASTOR) 250 MG capsule Take 1 capsule (250 mg total) by mouth 2 (two) times daily. 30 capsule 0   sertraline (ZOLOFT) 100 MG tablet TAKE 1 & 1/2 (ONE &  ONE-HALF) TABLETS BY MOUTH ONCE DAILY (Patient taking differently: Take 150 mg by mouth daily. ) 135 tablet 1   sevelamer carbonate (RENVELA) 800 MG tablet Take 2 tablets (1,600 mg total) by mouth 3 (three) times daily with meals. 180 tablet 0   vitamin B-12 (CYANOCOBALAMIN) 1000 MCG tablet Take 1,000 mcg by mouth daily.     No current facility-administered medications for this visit.    Objective  Objective:  BP 130/60    Pulse 69    Temp 99.8 F (37.7 C)    Ht '5\' 8"'  (1.727 m)    Wt 201 lb 9.6 oz (91.4 kg)    SpO2 94%    BMI 30.65 kg/m  Gen: NAD, resting comfortably CV: RRR no murmurs rubs or gallops Lungs: CTAB no crackles, wheeze, rhonchi Abdomen: soft/nontender/nondistended/normal bowel sounds.  Ext: 1+ pitting edema Skin: warm, dry  Diabetic Foot Exam - Simple   Simple Foot Form Diabetic Foot exam was performed with the following findings: Yes 04/13/2019  3:47 PM  Visual Inspection No deformities, no ulcerations, no other skin breakdown bilaterally: Yes Sensation Testing Intact to touch and monofilament testing bilaterally: Yes Pulse Check Posterior Tibialis and Dorsalis pulse intact bilaterally: Yes Comments       Assessment and Plan:    ESRD on hemodialysis [facility]) Patient initiated dialysis while hospitalized in September 2020.  Currently on Monday Wednesday Friday dialysis at [facility] site.  This has been a difficult transition for him-counseling provided today.  Patient has had decreased urination off Lasix and on hemodialysis- we discussed this transition.  He does have some burning with peeing-he even had this while being treated for urinary tract infection on ciprofloxacin for pansensitive Pseudomonas- offered to recollect urine but obviously with decreased urination he had difficulty providing sample.  He states he successfully completed ciprofloxacin treatment after IV antibiotics in the hospital.  Asked him to follow-up with dialysis to collect urine  if he is able to urinate before sessions in the future-if has worsening symptoms to let us know.  There is also risk he could be having some urinary retention but I suspect if this was significant he would have had worsening symptoms given several days since he has had a Foley catheter.  Did not send for bladder scan at this time.  Leukocytosis during hospitalization may have been related to UTI-update CBC with differential today  Ischemic cardiomyopathy No longer on Lasix on starting hemodialysis-hemodialysis will serve for fluid reduction purposes  ICM EF 25-30%. S/p BiV ICD. Repeat EF in 2017 now 60% EF-stable  on 2019 echocardiogram- normalization with medical therapy  Controlled type 2 diabetes mellitus with chronic kidney disease on chronic dialysis ([place]) Off Lantus after starting hemodialysis.  In the past had been up to 60 units of Lantus decreased over time with nutrition counseling and worsening renal function.  We opted to let blood sugars trend up some- A1c goal 8 or 8.5 or less.  Fasting blood sugars have been below 135.  Postprandial sugars between 150 and 190.  We will repeat A1c at next follow-up after December 21 or later-next visit will likely be prior to that  Chronic systolic CHF (congestive heart failure) (HCC) Stable-Off Lasix after starting hemodialysis-fluid status managed by dialysis  Hypertension associated with diabetes ([place]) Controlled on-Coreg 12.5 mg BID, Imdur 15 mg.  Continue current medication  Hyperlipidemia associated with type 2 diabetes mellitus ([place]) Reasonably controlled on atorvastatin 40 mg-will need lipid panel next set of labs after October 2  Hyperkalemia, diminished renal excretion Had hyperkalemia prior to dialysis-update CMP today-suspect resolved  COPD (chronic obstructive pulmonary disease) (HCC) Stable during hospitalization-no regular inhaler use  Patient needed extended counseling related to recent changes in his body into his lifestyle- he  is a very kind gentleman and try to encourage him as best I can to continue to put 1 foot in front of the other/move forward  Thrombocytopenia-noted during hospitalization-continue to monitor along with leukocytosis  Please note patient with end-stage renal disease-not a candidate for urine microalbumin  Patient also noted to have CAD-appears stable without chest pain-continue to monitor.  Continue current medications  Recommended follow up: 6 to 9-week follow-up Future Appointments  Date Time Provider [place]  05/02/2019 11:40 AM [name], Axavier M, MD [facility]  05/18/2019  1:15 PM Evelina Bucy, DPM [facility]  05/30/2019  2:40 PM [name], MD [facility]  06/20/2019  8:30 AM [facility] DEVICE REMOTES [facility]  09/19/2019  8:30 AM [facility] DEVICE REMOTES [facility]  12/19/2019  8:30 AM [facility] DEVICE REMOTES [facility]  03/19/2020  8:30 AM [facility] DEVICE REMOTES [facility]    Lab/Order associations:   ICD-10-CM   1. ESRD (end stage renal disease) ([place])  N18.6 CBC with Differential/Platelet    Comprehensive metabolic panel  2. Controlled type 2 diabetes mellitus with chronic kidney disease on chronic dialysis, with long-term current use of insulin (HCC)  E11.22    N18.6    Z79.4    Z99.2   3. Chronic systolic CHF (congestive heart failure) (HCC)  I50.22   4. Hyperlipidemia associated with type 2 diabetes mellitus ([place])  E11.69    E78.5   5. Chronic obstructive pulmonary disease, unspecified COPD type ([place])  J44.9   6. Thrombocytopenia ([place])  D69.6   7. Hyperkalemia, diminished renal excretion  E87.5   8. Coronary artery disease involving native coronary artery of native heart with angina pectoris (HCC) Chronic I25.119   9. Ischemic cardiomyopathy  I25.5    Return precautions advised.  Garret Reddish, MD

## 2019-04-12 NOTE — Patient Instructions (Addendum)
With burning with peeing- see if you have to urinate around time of dialysis if they can collect a urine culture- I would have done one today but you were not able to pee  Stay off lantus for now- I do want to know if blood sugar gets above 180 regularly  Stay off lasix as well since you are on dialysis now   I am rooting for you- this is a very tough time of transition- keep putting one foot in front of the other and I hope things get better for you  Lets check in perhaps 6-9 weeks from now  Please stop by lab before you go If you do not have mychart- we will call you about results within 5 business days of [place] receiving them.  If you have mychart- we will send your results within 3 business days of [place] receiving them.  If abnormal or we want to clarify a result, we will call or mychart you to make sure you receive the message.  If you have questions or concerns or don't hear within 5-7 days, please send [place] a message or call us.

## 2019-04-13 ENCOUNTER — Encounter: Payer: Self-pay | Admitting: Family Medicine

## 2019-04-13 ENCOUNTER — Ambulatory Visit (INDEPENDENT_AMBULATORY_CARE_PROVIDER_SITE_OTHER): Payer: PPO | Admitting: Family Medicine

## 2019-04-13 ENCOUNTER — Other Ambulatory Visit: Payer: Self-pay

## 2019-04-13 VITALS — BP 130/60 | HR 69 | Temp 99.8°F | Ht 68.0 in | Wt 201.6 lb

## 2019-04-13 DIAGNOSIS — N186 End stage renal disease: Secondary | ICD-10-CM | POA: Diagnosis not present

## 2019-04-13 DIAGNOSIS — I442 Atrioventricular block, complete: Secondary | ICD-10-CM | POA: Diagnosis not present

## 2019-04-13 DIAGNOSIS — I255 Ischemic cardiomyopathy: Secondary | ICD-10-CM

## 2019-04-13 DIAGNOSIS — Z9181 History of falling: Secondary | ICD-10-CM | POA: Diagnosis not present

## 2019-04-13 DIAGNOSIS — G4730 Sleep apnea, unspecified: Secondary | ICD-10-CM | POA: Diagnosis not present

## 2019-04-13 DIAGNOSIS — E1169 Type 2 diabetes mellitus with other specified complication: Secondary | ICD-10-CM

## 2019-04-13 DIAGNOSIS — E1142 Type 2 diabetes mellitus with diabetic polyneuropathy: Secondary | ICD-10-CM | POA: Diagnosis not present

## 2019-04-13 DIAGNOSIS — J449 Chronic obstructive pulmonary disease, unspecified: Secondary | ICD-10-CM | POA: Diagnosis not present

## 2019-04-13 DIAGNOSIS — Z992 Dependence on renal dialysis: Secondary | ICD-10-CM

## 2019-04-13 DIAGNOSIS — I2510 Atherosclerotic heart disease of native coronary artery without angina pectoris: Secondary | ICD-10-CM | POA: Diagnosis not present

## 2019-04-13 DIAGNOSIS — Z7982 Long term (current) use of aspirin: Secondary | ICD-10-CM | POA: Diagnosis not present

## 2019-04-13 DIAGNOSIS — I25119 Atherosclerotic heart disease of native coronary artery with unspecified angina pectoris: Secondary | ICD-10-CM

## 2019-04-13 DIAGNOSIS — I5022 Chronic systolic (congestive) heart failure: Secondary | ICD-10-CM

## 2019-04-13 DIAGNOSIS — R351 Nocturia: Secondary | ICD-10-CM | POA: Diagnosis not present

## 2019-04-13 DIAGNOSIS — E875 Hyperkalemia: Secondary | ICD-10-CM | POA: Diagnosis not present

## 2019-04-13 DIAGNOSIS — E1122 Type 2 diabetes mellitus with diabetic chronic kidney disease: Secondary | ICD-10-CM

## 2019-04-13 DIAGNOSIS — Z794 Long term (current) use of insulin: Secondary | ICD-10-CM | POA: Diagnosis not present

## 2019-04-13 DIAGNOSIS — E785 Hyperlipidemia, unspecified: Secondary | ICD-10-CM | POA: Diagnosis not present

## 2019-04-13 DIAGNOSIS — N401 Enlarged prostate with lower urinary tract symptoms: Secondary | ICD-10-CM | POA: Diagnosis not present

## 2019-04-13 DIAGNOSIS — D696 Thrombocytopenia, unspecified: Secondary | ICD-10-CM | POA: Insufficient documentation

## 2019-04-13 DIAGNOSIS — K219 Gastro-esophageal reflux disease without esophagitis: Secondary | ICD-10-CM | POA: Diagnosis not present

## 2019-04-13 DIAGNOSIS — F331 Major depressive disorder, recurrent, moderate: Secondary | ICD-10-CM | POA: Diagnosis not present

## 2019-04-13 DIAGNOSIS — Z87891 Personal history of nicotine dependence: Secondary | ICD-10-CM | POA: Diagnosis not present

## 2019-04-13 DIAGNOSIS — I132 Hypertensive heart and chronic kidney disease with heart failure and with stage 5 chronic kidney disease, or end stage renal disease: Secondary | ICD-10-CM | POA: Diagnosis not present

## 2019-04-13 DIAGNOSIS — M109 Gout, unspecified: Secondary | ICD-10-CM | POA: Diagnosis not present

## 2019-04-13 NOTE — Assessment & Plan Note (Signed)
Off Lantus after starting hemodialysis.  In the past had been up to 60 units of Lantus decreased over time with nutrition counseling and worsening renal function.  We opted to let blood sugars trend up some- A1c goal 8 or 8.5 or less.  Fasting blood sugars have been below 135.  Postprandial sugars between 150 and 190.  We will repeat A1c at next follow-up after December 21 or later-next visit will likely be prior to that

## 2019-04-13 NOTE — Addendum Note (Signed)
Addended by: [name] on: 04/13/2019 05:46 PM   Modules accepted: Level of Service

## 2019-04-13 NOTE — Assessment & Plan Note (Signed)
Stable during hospitalization-no regular inhaler use

## 2019-04-13 NOTE — Assessment & Plan Note (Signed)
Reasonably controlled on atorvastatin 40 mg-will need lipid panel next set of labs after October 2

## 2019-04-13 NOTE — Assessment & Plan Note (Signed)
Had hyperkalemia prior to dialysis-update CMP today-suspect resolved

## 2019-04-13 NOTE — Assessment & Plan Note (Addendum)
Patient initiated dialysis while hospitalized in September 2020.  Currently on Monday Wednesday Friday dialysis at [facility] site.  This has been a difficult transition for him-counseling provided today.  Patient has had decreased urination off Lasix and on hemodialysis- we discussed this transition.  He does have some burning with peeing-he even had this while being treated for urinary tract infection on ciprofloxacin for pansensitive Pseudomonas- offered to recollect urine but obviously with decreased urination he had difficulty providing sample.  He states he successfully completed ciprofloxacin treatment after IV antibiotics in the hospital.  Asked him to follow-up with dialysis to collect urine if he is able to urinate before sessions in the future-if has worsening symptoms to let us know.  There is also risk he could be having some urinary retention but I suspect if this was significant he would have had worsening symptoms given several days since he has had a Foley catheter.  Did not send for bladder scan at this time.  Leukocytosis during hospitalization may have been related to UTI-update CBC with differential today

## 2019-04-13 NOTE — Assessment & Plan Note (Signed)
Stable-Off Lasix after starting hemodialysis-fluid status managed by dialysis

## 2019-04-13 NOTE — Assessment & Plan Note (Signed)
No longer on Lasix on starting hemodialysis-hemodialysis will serve for fluid reduction purposes  ICM EF 25-30%. S/p BiV ICD. Repeat EF in 2017 now 60% EF-stable on 2019 echocardiogram- normalization with medical therapy

## 2019-04-13 NOTE — Assessment & Plan Note (Signed)
Controlled on-Coreg 12.5 mg BID, Imdur 15 mg.  Continue current medication

## 2019-04-14 ENCOUNTER — Telehealth: Payer: Self-pay

## 2019-04-14 DIAGNOSIS — D689 Coagulation defect, unspecified: Secondary | ICD-10-CM | POA: Diagnosis not present

## 2019-04-14 DIAGNOSIS — D509 Iron deficiency anemia, unspecified: Secondary | ICD-10-CM | POA: Diagnosis not present

## 2019-04-14 DIAGNOSIS — T8249XA Other complication of vascular dialysis catheter, initial encounter: Secondary | ICD-10-CM | POA: Diagnosis not present

## 2019-04-14 DIAGNOSIS — Z111 Encounter for screening for respiratory tuberculosis: Secondary | ICD-10-CM | POA: Diagnosis not present

## 2019-04-14 DIAGNOSIS — N186 End stage renal disease: Secondary | ICD-10-CM | POA: Diagnosis not present

## 2019-04-14 DIAGNOSIS — D631 Anemia in chronic kidney disease: Secondary | ICD-10-CM | POA: Diagnosis not present

## 2019-04-14 DIAGNOSIS — D688 Other specified coagulation defects: Secondary | ICD-10-CM | POA: Diagnosis not present

## 2019-04-14 DIAGNOSIS — N2581 Secondary hyperparathyroidism of renal origin: Secondary | ICD-10-CM | POA: Diagnosis not present

## 2019-04-14 DIAGNOSIS — E876 Hypokalemia: Secondary | ICD-10-CM | POA: Diagnosis not present

## 2019-04-14 DIAGNOSIS — Z992 Dependence on renal dialysis: Secondary | ICD-10-CM | POA: Diagnosis not present

## 2019-04-14 DIAGNOSIS — Z23 Encounter for immunization: Secondary | ICD-10-CM | POA: Diagnosis not present

## 2019-04-14 DIAGNOSIS — E1129 Type 2 diabetes mellitus with other diabetic kidney complication: Secondary | ICD-10-CM | POA: Diagnosis not present

## 2019-04-14 LAB — COMPREHENSIVE METABOLIC PANEL
ALT: 14 U/L (ref 0–53)
AST: 26 U/L (ref 0–37)
Albumin: 3.3 g/dL — ABNORMAL LOW (ref 3.5–5.2)
Alkaline Phosphatase: 127 U/L — ABNORMAL HIGH (ref 39–117)
BUN: 32 mg/dL — ABNORMAL HIGH (ref 6–23)
CO2: 29 mEq/L (ref 19–32)
Calcium: 8.5 mg/dL (ref 8.4–10.5)
Chloride: 100 mEq/L (ref 96–112)
Creatinine, Ser: 4.66 mg/dL (ref 0.40–1.50)
GFR: 12.02 mL/min — CL (ref >60.00)
Glucose, Bld: 101 mg/dL — ABNORMAL HIGH (ref 70–99)
Potassium: 4.1 mEq/L (ref 3.5–5.1)
Sodium: 140 mEq/L (ref 135–145)
Total Bilirubin: 0.4 mg/dL (ref 0.2–1.2)
Total Protein: 6.1 g/dL (ref 6.0–8.3)

## 2019-04-14 LAB — CBC WITH DIFFERENTIAL/PLATELET
Basophils Absolute: 0.2 10*3/uL — ABNORMAL HIGH (ref 0.0–0.1)
Basophils Relative: 1 % (ref 0.0–3.0)
Eosinophils Absolute: 0.6 10*3/uL (ref 0.0–0.7)
Eosinophils Relative: 3.8 % (ref 0.0–5.0)
HCT: 29.9 % — ABNORMAL LOW (ref 39.0–52.0)
Hemoglobin: 9.9 g/dL — ABNORMAL LOW (ref 13.0–17.0)
Lymphocytes Relative: 9.7 % — ABNORMAL LOW (ref 12.0–46.0)
Lymphs Abs: 1.6 10*3/uL (ref 0.7–4.0)
MCHC: 33 g/dL (ref 30.0–36.0)
MCV: 92.6 fl (ref 78.0–100.0)
Monocytes Absolute: 1.5 10*3/uL — ABNORMAL HIGH (ref 0.1–1.0)
Monocytes Relative: 9.1 % (ref 3.0–12.0)
Neutro Abs: 12.5 10*3/uL — ABNORMAL HIGH (ref 1.4–7.7)
Neutrophils Relative %: 76.4 % (ref 43.0–77.0)
Platelets: 130 10*3/uL — ABNORMAL LOW (ref 150.0–400.0)
RBC: 3.23 Mil/uL — ABNORMAL LOW (ref 4.22–5.81)
RDW: 14.7 % (ref 11.5–15.5)
WBC: 16.4 10*3/uL — ABNORMAL HIGH (ref 4.0–10.5)

## 2019-04-16 ENCOUNTER — Encounter: Payer: Self-pay | Admitting: Family Medicine

## 2019-04-17 ENCOUNTER — Other Ambulatory Visit: Payer: Self-pay

## 2019-04-17 MED ORDER — FREESTYLE SYSTEM KIT: PACK | 1 refills | Status: DC

## 2019-04-18 DIAGNOSIS — N186 End stage renal disease: Secondary | ICD-10-CM | POA: Diagnosis not present

## 2019-04-18 DIAGNOSIS — M109 Gout, unspecified: Secondary | ICD-10-CM | POA: Diagnosis not present

## 2019-04-18 DIAGNOSIS — I2510 Atherosclerotic heart disease of native coronary artery without angina pectoris: Secondary | ICD-10-CM | POA: Diagnosis not present

## 2019-04-18 DIAGNOSIS — E1142 Type 2 diabetes mellitus with diabetic polyneuropathy: Secondary | ICD-10-CM | POA: Diagnosis not present

## 2019-04-18 DIAGNOSIS — I442 Atrioventricular block, complete: Secondary | ICD-10-CM | POA: Diagnosis not present

## 2019-04-18 DIAGNOSIS — Z992 Dependence on renal dialysis: Secondary | ICD-10-CM | POA: Diagnosis not present

## 2019-04-18 DIAGNOSIS — N401 Enlarged prostate with lower urinary tract symptoms: Secondary | ICD-10-CM | POA: Diagnosis not present

## 2019-04-18 DIAGNOSIS — J449 Chronic obstructive pulmonary disease, unspecified: Secondary | ICD-10-CM | POA: Diagnosis not present

## 2019-04-18 DIAGNOSIS — G4730 Sleep apnea, unspecified: Secondary | ICD-10-CM | POA: Diagnosis not present

## 2019-04-18 DIAGNOSIS — I132 Hypertensive heart and chronic kidney disease with heart failure and with stage 5 chronic kidney disease, or end stage renal disease: Secondary | ICD-10-CM | POA: Diagnosis not present

## 2019-04-18 DIAGNOSIS — I5022 Chronic systolic (congestive) heart failure: Secondary | ICD-10-CM | POA: Diagnosis not present

## 2019-04-18 DIAGNOSIS — K219 Gastro-esophageal reflux disease without esophagitis: Secondary | ICD-10-CM | POA: Diagnosis not present

## 2019-04-18 DIAGNOSIS — F331 Major depressive disorder, recurrent, moderate: Secondary | ICD-10-CM | POA: Diagnosis not present

## 2019-04-18 DIAGNOSIS — Z7982 Long term (current) use of aspirin: Secondary | ICD-10-CM | POA: Diagnosis not present

## 2019-04-18 DIAGNOSIS — I255 Ischemic cardiomyopathy: Secondary | ICD-10-CM | POA: Diagnosis not present

## 2019-04-18 DIAGNOSIS — R351 Nocturia: Secondary | ICD-10-CM | POA: Diagnosis not present

## 2019-04-18 DIAGNOSIS — Z87891 Personal history of nicotine dependence: Secondary | ICD-10-CM | POA: Diagnosis not present

## 2019-04-18 DIAGNOSIS — Z9181 History of falling: Secondary | ICD-10-CM | POA: Diagnosis not present

## 2019-04-18 DIAGNOSIS — E1122 Type 2 diabetes mellitus with diabetic chronic kidney disease: Secondary | ICD-10-CM | POA: Diagnosis not present

## 2019-04-19 DIAGNOSIS — I5022 Chronic systolic (congestive) heart failure: Secondary | ICD-10-CM | POA: Diagnosis not present

## 2019-04-19 DIAGNOSIS — Z7982 Long term (current) use of aspirin: Secondary | ICD-10-CM

## 2019-04-19 DIAGNOSIS — N186 End stage renal disease: Secondary | ICD-10-CM | POA: Diagnosis not present

## 2019-04-19 DIAGNOSIS — J449 Chronic obstructive pulmonary disease, unspecified: Secondary | ICD-10-CM

## 2019-04-19 DIAGNOSIS — Z992 Dependence on renal dialysis: Secondary | ICD-10-CM

## 2019-04-19 DIAGNOSIS — E1122 Type 2 diabetes mellitus with diabetic chronic kidney disease: Secondary | ICD-10-CM | POA: Diagnosis not present

## 2019-04-19 DIAGNOSIS — E441 Mild protein-calorie malnutrition: Secondary | ICD-10-CM | POA: Insufficient documentation

## 2019-04-19 DIAGNOSIS — E1142 Type 2 diabetes mellitus with diabetic polyneuropathy: Secondary | ICD-10-CM

## 2019-04-19 DIAGNOSIS — I255 Ischemic cardiomyopathy: Secondary | ICD-10-CM

## 2019-04-19 DIAGNOSIS — G4730 Sleep apnea, unspecified: Secondary | ICD-10-CM

## 2019-04-19 DIAGNOSIS — F331 Major depressive disorder, recurrent, moderate: Secondary | ICD-10-CM | POA: Diagnosis not present

## 2019-04-19 DIAGNOSIS — K219 Gastro-esophageal reflux disease without esophagitis: Secondary | ICD-10-CM

## 2019-04-19 DIAGNOSIS — M109 Gout, unspecified: Secondary | ICD-10-CM | POA: Diagnosis not present

## 2019-04-19 DIAGNOSIS — I442 Atrioventricular block, complete: Secondary | ICD-10-CM | POA: Diagnosis not present

## 2019-04-19 DIAGNOSIS — R351 Nocturia: Secondary | ICD-10-CM

## 2019-04-19 DIAGNOSIS — I2510 Atherosclerotic heart disease of native coronary artery without angina pectoris: Secondary | ICD-10-CM | POA: Diagnosis not present

## 2019-04-19 DIAGNOSIS — Z9181 History of falling: Secondary | ICD-10-CM

## 2019-04-19 DIAGNOSIS — N401 Enlarged prostate with lower urinary tract symptoms: Secondary | ICD-10-CM | POA: Diagnosis not present

## 2019-04-19 DIAGNOSIS — I132 Hypertensive heart and chronic kidney disease with heart failure and with stage 5 chronic kidney disease, or end stage renal disease: Secondary | ICD-10-CM | POA: Diagnosis not present

## 2019-04-19 DIAGNOSIS — Z87891 Personal history of nicotine dependence: Secondary | ICD-10-CM

## 2019-04-21 ENCOUNTER — Telehealth: Payer: Self-pay

## 2019-04-21 NOTE — Telephone Encounter (Signed)
Phone call placed to patient's daughter in law, [name], to check in on patient and to offer to schedule visit with Palliative Care. [name] shared that patient has moved to an apartment building for seniors. He is now living at [facility] in [place]. He has adjusted well and enjoying having people around. He began dialysis about 2 weeks ago and so far he is handling it ok. [name] requested to wait until early November to schedule a follow up visit with Palliative Care.

## 2019-04-25 DIAGNOSIS — Z23 Encounter for immunization: Secondary | ICD-10-CM | POA: Insufficient documentation

## 2019-04-26 NOTE — Progress Notes (Signed)
CARDIOLOGY OFFICE NOTE  Date:  05/02/2019    Rodney Farley Date of Birth: 04-11-1932 Medical Record #[id number]  PCP:  Rodney Olp, MD  Cardiologist:  Rodney [name]  MD  Chief Complaint  Patient presents with  . Coronary Artery Disease    History of Present Illness: Rodney Farley is a 85 y.o. male who is seen for follow up CAD and CHF.   He has a history of CAD (s/p MI in 1994/1995 while in [place] s/p questionable PCI, , CHB 2010 s/p pacemaker, CKD stage IV, DM, HTN, HLD, remote tobacco abuse, COPD, moderate sleep apnea, and chronic systolic CHF.    In 2016 he had progression of DOE, chest discomfort and chest pounding with severe limitation on exertion. 2D Echo 09/2014 showed EF 35-40% with +WMA, grade 1 DD, mild MR. PFTs looked remarkably good. He underwent cardiac cath on 03/28/15 and Cr was 2.14 . LE duplex negative for DVT. Cath showed severe 3V CAD - the mid LAD, second diagonal, and third OM branches appeared suitable for PCI +/- distal LCx. The other option would be CABG. After discussion concerning risk and benefits hewas treated with PCI.   Patient underwent Multivessel PCI including stenting of the mid LAD, second diagonal, and third OM with DES on 04/09/15.  Distal 70% circumflex lesion  treated medically.He was started on aspirin and Plavix. Hydralazine was added for afterload reduction and better blood pressure control.   After his procedure he  had a marked improvement in his symptoms of dyspnea.  No chest pain. Breathing was much better. He had repeat Echo that still showed depressed EF 25-30% despite revascularization. He underwent upgrade of PPM to a BiV ICD device by Dr. Caryl Farley on 09/23/15. Follow up Echo in July 2017 showed normalization of EF.     He had a prolonged hospitalization from 9/14-9/25/2018 when he presented to the hospital with right lower quadrant abdominal pain and found to have a ruptured appendicitis with abscess. Gen. Surgery  was consulted and performed exploratory laparotomy with ileocecectomy on 03/27/2017. During the hospitalization, he also had acute on chronic renal insufficiency with peak creatinine of 5.75. However subsequent creatinine came down to 2.44. Followed by nephrology with reported stable renal function with GFR in 20s.    He underwent cholecystectomy on 10/13/2017.  Postoperative course was complicated by elevated troponin involving neck and shoulder pain.  Troponin peaked at the 2.14.  It was felt is likely demand ischemia associated with surgery.  Repeat echocardiogram obtained on 10/15/2017 showed EF 60 to 65%, paradoxical ventricular motion, mild MR.  Myoview obtained on the following day was intermediate risk was a large defect of severe severity present in the basal anteroseptal, mid anterior septal and apex location, this is large and a partially reversible defect concerning for mild to moderate ischemia.  Options was discussed with both the patient and his son, who preferred medical approach more than cardiac catheterization at this time.  Plavix was not started at this time due to his nosebleed and anemia. He was seen in follow up later in April with increased LE edema. Had run out of lasix for several days. Was started back on lasix 40 mg bid with compression hose and elevation of feet. Close follow up with Renal recommended. Last creatinine there in July was 4.05.  ICD check on March 21, 2019 showed 100% BiV pacing. No significant arrhythmias.   He had an AV fistula placed in July 2020. In September he  presented to the hospital with generalized weakness and fall secondary to uremia. He was started on HD. Also treated for UTI. He has noted a couple of times where his BP dropped with dialysis. Otherwise he is tolerating it well and has come to accept his new lifestyle.    Denies any dyspnea, chest pain or increased edema.   Weight has been stable.   Past Medical History:  Diagnosis Date  . AICD  (automatic cardioverter/defibrillator) present   . AKI (acute kidney injury) ([place]) 03/27/2017  . Anemia   . Anginal pain ([place])   . Anxiety   . Arm pain 05/08/2015   LEFT ARM  . Arthritis   . Atrioventricular block, complete ([place])    a. 2010 s/p pacemaker.  . Bell's palsy    left side. after shingles episode  . Bilateral renal cysts 07/23/2017   Simple and hemorrhagic noted on CT ab/pelvis   . BPH associated with nocturia   . Cardiomegaly   . Chronic systolic CHF (congestive heart failure) ([place])    EF normalized by Echo 2019  . CKD (chronic kidney disease), stage IV ([place])   . COPD (chronic obstructive pulmonary disease) ([facility])    Severe  . Coronary artery disease    a. s/p MI in 1994/1995 while in [place] s/p questionable PCI. 03/2015: progression of disease, for staged PCI.  [name] Depression   . Diabetic peripheral neuropathy ([place])   . Diarrhea   . Diverticulitis   . DOE (dyspnea on exertion) 03/28/2015  . Full dentures   . Gallstones   . GERD (gastroesophageal reflux disease)   . Gout   . Hard of hearing    B/L  . Heart murmur   . History of chronic pancreatitis 07/23/2017   noted on CT abd/pelvis  . History of shingles   . Hypercholesterolemia   . Hypertension   . Ischemic cardiomyopathy   . MI (myocardial infarction) ([place]) 1994; 1995  . Neuropathy    IN LOWER EXTREMITIES  . Nosebleed 10/06/2017   for 2 months most recent 10/06/2017  . Obesity   . Pacemaker    medtronic>>> MDT ICD 09/23/15  . Ruptured appendicitis   . Sleep apnea    "sleeps w/humidifyer when he panics and gets short of breath" (04/08/2015)  . TIA (transient ischemic attack) X 3  . Trigger middle finger of left hand   . Type II diabetes mellitus ([place])   . Wears glasses   . Wears hearing aid     Past Surgical History:  Procedure Laterality Date  . APPENDECTOMY    . AV FISTULA PLACEMENT Right 02/07/2019   Procedure: ARTERIOVENOUS (AV) FISTULA CREATION RIGHT UPPER ARM;  Surgeon: Waynetta Sandy, MD;  Location: [facility];  Service: Vascular;  Laterality: Right;  . CARDIAC CATHETERIZATION N/A 03/29/2015   Procedure: Right/Left Heart Cath and Coronary Angiography;  Surgeon: Jovanie M [name], MD;  Location: [facility];  Service: Cardiovascular;  Laterality: N/A;  . [place]   "after my MI; put me on heart RX after cath"  . CARDIAC CATHETERIZATION N/A 04/09/2015   Procedure: Coronary Stent Intervention;  Surgeon: Malik M [name], MD;  Location: [facility];  Service: Cardiovascular;  Laterality: N/A;  . CATARACT EXTRACTION W/ INTRAOCULAR LENS  IMPLANT, BILATERAL    . CHOLECYSTECTOMY N/A 10/13/2017   Procedure: LAPAROSCOPIC CHOLECYSTECTOMY WITH LYSIS OF ADHESIONS;  Surgeon: Ileana Roup, MD;  Location: [facility];  Service: General;  Laterality: N/A;  .  COLONOSCOPY    . DENTAL SURGERY    . EP IMPLANTABLE DEVICE N/A 09/23/2015   MDT CRT-D, Dr. Caryl Farley  . HIATAL HERNIA REPAIR  1977  . ILEOCECETOMY N/A 03/27/2017   Procedure: ILEOCECECTOMY;  Surgeon: Ileana Roup, MD;  Location: [facility];  Service: General;  Laterality: N/A;  . INSERT / REPLACE / REMOVE PACEMAKER  07/2008   Complete heart block status post DDD with good function  . IR FLUORO GUIDE CV LINE RIGHT  04/03/2019  . IR US GUIDE VASC ACCESS RIGHT  04/03/2019  . LAPAROTOMY N/A 03/27/2017   Procedure: EXPLORATORY LAPAROTOMY;  Surgeon: Ileana Roup, MD;  Location: [facility];  Service: General;  Laterality: N/A;  . TONSILLECTOMY    . UPPER GI ENDOSCOPY       Medications: Current Outpatient Medications  Medication Sig Dispense Refill  . aspirin EC 81 MG tablet Take 81 mg by mouth daily.    [name] atorvastatin (LIPITOR) 40 MG tablet Take 1 tablet (40 mg total) by mouth daily. (Patient taking differently: Take 40 mg by mouth at bedtime. ) 90 tablet 3  . Carboxymethylcellul-Glycerin (LUBRICATING EYE DROPS OP) Place 1 drop into both eyes 3 (three) times daily as needed (dry eyes).    .  carvedilol (COREG) 12.5 MG tablet Take 12.5 mg by mouth 2 (two) times daily.    [name] Epoetin Alfa (PROCRIT IJ) Inject 1 Dose as directed every Thursday. Through Dr. Justin Mend     . erythromycin ophthalmic ointment Place 1 application into the left eye at bedtime.    [name] glucose blood (FREESTYLE TEST STRIPS) test strip Use to check blood sugar daily 100 each 3  . glucose monitoring kit (FREESTYLE) monitoring kit USE TO MONITOR BLOOD GLUCOSE AS DIRECTED 1 each 1  . lansoprazole (PREVACID) 15 MG capsule Take 1 capsule (15 mg total) by mouth daily at 12 noon. 90 capsule 3  . nitroGLYCERIN (NITROSTAT) 0.4 MG SL tablet Place 1 tablet (0.4 mg total) under the tongue every 5 (five) minutes as needed for chest pain. 2 doses before calling for emergent help 25 tablet 3  . oxymetazoline (AFRIN) 0.05 % nasal spray Place 1 spray into both nostrils 2 (two) times daily as needed for congestion.    . saccharomyces boulardii (FLORASTOR) 250 MG capsule Take 1 capsule (250 mg total) by mouth 2 (two) times daily. 30 capsule 0  . sertraline (ZOLOFT) 100 MG tablet TAKE 1 & 1/2 (ONE & ONE-HALF) TABLETS BY MOUTH ONCE DAILY (Patient taking differently: Take 150 mg by mouth daily. ) 135 tablet 1  . sevelamer carbonate (RENVELA) 800 MG tablet Take 2 tablets (1,600 mg total) by mouth 3 (three) times daily with meals. 180 tablet 0  . vitamin B-12 (CYANOCOBALAMIN) 1000 MCG tablet Take 1,000 mcg by mouth daily.     No current facility-administered medications for this visit.     Allergies: Allergies  Allergen Reactions  . Bee Venom Anaphylaxis  . Lyrica [Pregabalin] Other (See Comments)    hallucinations  . Prednisone Other (See Comments)    hallucinations  . Zocor [Simvastatin] Nausea Only and Other (See Comments)    Headache with brand name only.  Can take the generic.    Social History: The patient  reports that he quit smoking about 11 years ago. His smoking use included cigarettes. He has a 81.00 pack-year smoking history.  He has never used smokeless tobacco. He reports current alcohol use. He reports that he does not use drugs.  Family History: [name] family history includes Heart attack in his brother; Leukemia in his father; Stroke in his mother and sister.   Review of Systems: Please see the history of present illness.   Otherwise, the review of systems is positive for none.   All other systems are reviewed and negative.   Physical Exam: VS:  BP (!) 129/54   Pulse 76   Ht '5\' 8"'$  (1.727 m)   Wt 198 lb 6.4 oz (90 kg)   SpO2 98%   BMI 30.17 kg/m  .  BMI Body mass index is 30.17 kg/m.  Wt Readings from Last 3 Encounters:  05/02/19 198 lb 6.4 oz (90 kg)  04/13/19 201 lb 9.6 oz (91.4 kg)  04/09/19 209 lb 7 oz (95 kg)    GENERAL:  Well overweight elderly WM in NAD HEENT:  PERRL, EOMI, sclera are clear. Oropharynx is clear. NECK:  No jugular venous distention, carotid upstroke brisk and symmetric, no bruits, no thyromegaly or adenopathy LUNGS:  Clear to auscultation bilaterally CHEST:  Unremarkable HEART:  RRR,  PMI not displaced or sustained,S1 and S2 within normal limits, no S3, no S4: no clicks, no rubs, no murmurs ABD:  Soft, nontender. BS +, no masses or bruits. Midline lower abdominal surgical wound has healed well.  EXT:  2 + pulses throughout, no  edema, AV fistula in right arm with good thrill. Dialysis catheter in right subclavian. SKIN:  Warm and dry.  No rashes NEURO:  Alert and oriented x 3. Cranial nerves II through XII intact. PSYCH:  Cognitively intact     LABORATORY DATA:  EKG:  EKG is not ordered today.  Lab Results  Component Value Date   WBC 16.4 Repeated and verified X2. (H) 04/13/2019   HGB 9.9 (L) 04/13/2019   HCT 29.9 (L) 04/13/2019   PLT 130.0 Repeated and verified X2. (L) 04/13/2019   GLUCOSE 101 (H) 04/13/2019   CHOL 167 04/13/2018   TRIG 316.0 (H) 04/13/2018   HDL 33.00 (L) 04/13/2018   LDLDIRECT 60.0 04/13/2018   LDLCALC 65 06/26/2015   ALT 14  04/13/2019   AST 26 04/13/2019   NA 140 04/13/2019   K 4.1 04/13/2019   CL 100 04/13/2019   CREATININE 4.66 (HH) 04/13/2019   BUN 32 (H) 04/13/2019   CO2 29 04/13/2019   TSH 1.24 04/13/2018   PSA 0.55 09/19/2014   INR 1.3 (H) 04/03/2019   HGBA1C 5.7 (H) 04/02/2019   MICROALBUR 180.8 (H) 04/13/2018    BNP (last 3 results) No results for input(s): BNP in the last 8760 hours.  ProBNP (last 3 results) No results for input(s): PROBNP in the last 8760 hours.   Other Studies Reviewed Today:  Labs dated 03/11/18: glucose 119, BUN 62, creatinine 3.52. Potassium 6.2. Hgb 10.3.   Echo Study Conclusions from 01/23/16:  Study Conclusions  - Left ventricle: The cavity size was normal. Wall thickness was   increased in a pattern of mild LVH. Systolic function was normal.   The estimated ejection fraction was in the range of 60% to 65%.   Doppler parameters are consistent with abnormal left ventricular   relaxation (grade 1 diastolic dysfunction).  Echo 10/15/17: Study Conclusions  - Left ventricle: The cavity size was normal. There was severe   concentric hypertrophy. Systolic function was normal. The   estimated ejection fraction was in the range of 60% to 65%. Wall   motion was normal; there were no regional wall motion   abnormalities. The  study was not technically sufficient to allow   evaluation of LV diastolic dysfunction due to atrial   fibrillation. Doppler parameters are consistent with high   ventricular filling pressure. - Ventricular septum: Septal motion showed moderate paradox. These   changes are consistent with right ventricular pacing. - Aortic valve: Trileaflet; mildly thickened, moderately calcified   leaflets. - Mitral valve: There was mild regurgitation. - Left atrium: The atrium was mildly dilated.   Myoview 10/16/17: Defect 1: There is a large defect of severe severity present in the basal anteroseptal, mid anteroseptal and apex location.  This is an  intermediate risk study.  The left ventricular ejection fraction is mildly decreased (45-54%).   Abnormal, intermediate risk stress nuclear study with large, partially reversible anteroseptal and apical defect; some of defect may be related to ventricular paced rhythm; there also appears to be mild to moderate ischemia; EF 48 with hypokinesis of the distal septum and apex; moderate LVE.   Assessment/Plan: 1. CAD - s/p  Cardiac catheterization in 2016 revealing Mid LAD lesion, 90% stenosed, 2nd Diag lesion, 90% stenosed, 3rd Mrg lesion, 90% stenosed. He received drug-eluting stents to each lesion in September 2016. Distal 70% circumflex lesion is treated medically. He remains asymptomatic.  He did have mild troponin elevation post cholecystectomy - felt to be more demand ischemia.  Given drop in BP with dialysis I have recommended stopping Imdur.   2. ICM EF 25-30%. S/p BiV ICD. Normal device function in October 2018.  Echo in July 2017 and April 2019  showed normalization of EF. ICD followed by Dr. Caryl Farley  3. Chronic systolic HF -Last Echo showed normal LV function. S/p BiV ICD.  Not a candidate for ACEi/ARB  due to CKD. Volume status maintained by HD.   4. HTN - controlled. If he continues to have hypotension with dialysis may need to reduce Coreg.  5. HLD - on statin therapy with good LDL control.   6. S/p cholecystectomy  7. ESRD now on HD. Followed by Nephrology    Signed: Makai [name] MD, [facility]    05/02/2019 12:00 PM

## 2019-04-27 ENCOUNTER — Telehealth: Payer: Self-pay | Admitting: Family Medicine

## 2019-04-27 DIAGNOSIS — I442 Atrioventricular block, complete: Secondary | ICD-10-CM | POA: Diagnosis not present

## 2019-04-27 DIAGNOSIS — I2510 Atherosclerotic heart disease of native coronary artery without angina pectoris: Secondary | ICD-10-CM | POA: Diagnosis not present

## 2019-04-27 DIAGNOSIS — E1122 Type 2 diabetes mellitus with diabetic chronic kidney disease: Secondary | ICD-10-CM | POA: Diagnosis not present

## 2019-04-27 DIAGNOSIS — M109 Gout, unspecified: Secondary | ICD-10-CM | POA: Diagnosis not present

## 2019-04-27 DIAGNOSIS — I5022 Chronic systolic (congestive) heart failure: Secondary | ICD-10-CM | POA: Diagnosis not present

## 2019-04-27 DIAGNOSIS — E1142 Type 2 diabetes mellitus with diabetic polyneuropathy: Secondary | ICD-10-CM | POA: Diagnosis not present

## 2019-04-27 DIAGNOSIS — Z7982 Long term (current) use of aspirin: Secondary | ICD-10-CM | POA: Diagnosis not present

## 2019-04-27 DIAGNOSIS — I132 Hypertensive heart and chronic kidney disease with heart failure and with stage 5 chronic kidney disease, or end stage renal disease: Secondary | ICD-10-CM | POA: Diagnosis not present

## 2019-04-27 DIAGNOSIS — J449 Chronic obstructive pulmonary disease, unspecified: Secondary | ICD-10-CM | POA: Diagnosis not present

## 2019-04-27 DIAGNOSIS — F331 Major depressive disorder, recurrent, moderate: Secondary | ICD-10-CM | POA: Diagnosis not present

## 2019-04-27 DIAGNOSIS — Z87891 Personal history of nicotine dependence: Secondary | ICD-10-CM | POA: Diagnosis not present

## 2019-04-27 DIAGNOSIS — Z992 Dependence on renal dialysis: Secondary | ICD-10-CM | POA: Diagnosis not present

## 2019-04-27 DIAGNOSIS — I255 Ischemic cardiomyopathy: Secondary | ICD-10-CM | POA: Diagnosis not present

## 2019-04-27 DIAGNOSIS — N401 Enlarged prostate with lower urinary tract symptoms: Secondary | ICD-10-CM | POA: Diagnosis not present

## 2019-04-27 DIAGNOSIS — Z9181 History of falling: Secondary | ICD-10-CM | POA: Diagnosis not present

## 2019-04-27 DIAGNOSIS — R351 Nocturia: Secondary | ICD-10-CM | POA: Diagnosis not present

## 2019-04-27 DIAGNOSIS — K219 Gastro-esophageal reflux disease without esophagitis: Secondary | ICD-10-CM | POA: Diagnosis not present

## 2019-04-27 DIAGNOSIS — G4730 Sleep apnea, unspecified: Secondary | ICD-10-CM | POA: Diagnosis not present

## 2019-04-27 DIAGNOSIS — N186 End stage renal disease: Secondary | ICD-10-CM | POA: Diagnosis not present

## 2019-04-27 NOTE — Telephone Encounter (Signed)
I left a message asking the patient to call and schedule Medicare AWV with [name] ([facility]).  If patient calls back, please schedule Medicare Wellness Visit at next available opening.  [name] (Dee-Dee) °

## 2019-04-29 ENCOUNTER — Encounter: Payer: Self-pay | Admitting: Family Medicine

## 2019-05-01 ENCOUNTER — Other Ambulatory Visit: Payer: Self-pay

## 2019-05-01 MED ORDER — FREESTYLE TEST VI STRP: ORAL_STRIP | 3 refills | Status: AC

## 2019-05-01 MED ORDER — FREESTYLE SYSTEM KIT: PACK | 1 refills | Status: AC

## 2019-05-02 ENCOUNTER — Encounter: Payer: Self-pay | Admitting: Cardiology

## 2019-05-02 ENCOUNTER — Ambulatory Visit (INDEPENDENT_AMBULATORY_CARE_PROVIDER_SITE_OTHER): Payer: PPO | Admitting: Cardiology

## 2019-05-02 ENCOUNTER — Other Ambulatory Visit: Payer: Self-pay

## 2019-05-02 VITALS — BP 129/54 | HR 76 | Ht 68.0 in | Wt 198.4 lb

## 2019-05-02 DIAGNOSIS — I442 Atrioventricular block, complete: Secondary | ICD-10-CM

## 2019-05-02 DIAGNOSIS — Z95810 Presence of automatic (implantable) cardiac defibrillator: Secondary | ICD-10-CM

## 2019-05-02 DIAGNOSIS — I2510 Atherosclerotic heart disease of native coronary artery without angina pectoris: Secondary | ICD-10-CM

## 2019-05-02 DIAGNOSIS — N186 End stage renal disease: Secondary | ICD-10-CM | POA: Diagnosis not present

## 2019-05-02 DIAGNOSIS — I5022 Chronic systolic (congestive) heart failure: Secondary | ICD-10-CM

## 2019-05-02 NOTE — Patient Instructions (Addendum)
Stop taking Imdur- isosorbide  Continue your other therapy  Follow up in one year

## 2019-05-11 DIAGNOSIS — D689 Coagulation defect, unspecified: Secondary | ICD-10-CM | POA: Insufficient documentation

## 2019-05-13 DIAGNOSIS — N186 End stage renal disease: Secondary | ICD-10-CM | POA: Diagnosis not present

## 2019-05-13 DIAGNOSIS — Z992 Dependence on renal dialysis: Secondary | ICD-10-CM | POA: Diagnosis not present

## 2019-05-13 DIAGNOSIS — E1129 Type 2 diabetes mellitus with other diabetic kidney complication: Secondary | ICD-10-CM | POA: Diagnosis not present

## 2019-05-15 DIAGNOSIS — Z23 Encounter for immunization: Secondary | ICD-10-CM | POA: Diagnosis not present

## 2019-05-15 DIAGNOSIS — T8249XA Other complication of vascular dialysis catheter, initial encounter: Secondary | ICD-10-CM | POA: Diagnosis not present

## 2019-05-15 DIAGNOSIS — N2581 Secondary hyperparathyroidism of renal origin: Secondary | ICD-10-CM | POA: Diagnosis not present

## 2019-05-15 DIAGNOSIS — D688 Other specified coagulation defects: Secondary | ICD-10-CM | POA: Diagnosis not present

## 2019-05-15 DIAGNOSIS — D689 Coagulation defect, unspecified: Secondary | ICD-10-CM | POA: Diagnosis not present

## 2019-05-15 DIAGNOSIS — N186 End stage renal disease: Secondary | ICD-10-CM | POA: Diagnosis not present

## 2019-05-15 DIAGNOSIS — D631 Anemia in chronic kidney disease: Secondary | ICD-10-CM | POA: Diagnosis not present

## 2019-05-15 DIAGNOSIS — E876 Hypokalemia: Secondary | ICD-10-CM | POA: Diagnosis not present

## 2019-05-15 DIAGNOSIS — Z992 Dependence on renal dialysis: Secondary | ICD-10-CM | POA: Diagnosis not present

## 2019-05-15 DIAGNOSIS — D509 Iron deficiency anemia, unspecified: Secondary | ICD-10-CM | POA: Diagnosis not present

## 2019-05-15 DIAGNOSIS — E1129 Type 2 diabetes mellitus with other diabetic kidney complication: Secondary | ICD-10-CM | POA: Diagnosis not present

## 2019-05-17 LAB — IRON,TIBC AND FERRITIN PANEL
Iron: 96
TIBC: 276
UIBC: 180

## 2019-05-17 LAB — CBC AND DIFFERENTIAL
HCT: 32 — AB (ref 41–53)
Hemoglobin: 31.2 — AB (ref 13.5–17.5)
Platelets: 129 — AB (ref 150–399)
WBC: 7.9

## 2019-05-17 LAB — CBC: RBC: 3.15 — AB (ref 3.87–5.11)

## 2019-05-17 LAB — BASIC METABOLIC PANEL: Glucose: 103

## 2019-05-18 ENCOUNTER — Ambulatory Visit (INDEPENDENT_AMBULATORY_CARE_PROVIDER_SITE_OTHER): Payer: PPO | Admitting: Podiatry

## 2019-05-18 ENCOUNTER — Other Ambulatory Visit: Payer: Self-pay

## 2019-05-18 DIAGNOSIS — F331 Major depressive disorder, recurrent, moderate: Secondary | ICD-10-CM | POA: Diagnosis not present

## 2019-05-18 DIAGNOSIS — G4730 Sleep apnea, unspecified: Secondary | ICD-10-CM | POA: Diagnosis not present

## 2019-05-18 DIAGNOSIS — I255 Ischemic cardiomyopathy: Secondary | ICD-10-CM | POA: Diagnosis not present

## 2019-05-18 DIAGNOSIS — Z7982 Long term (current) use of aspirin: Secondary | ICD-10-CM | POA: Diagnosis not present

## 2019-05-18 DIAGNOSIS — I5022 Chronic systolic (congestive) heart failure: Secondary | ICD-10-CM | POA: Diagnosis not present

## 2019-05-18 DIAGNOSIS — R351 Nocturia: Secondary | ICD-10-CM | POA: Diagnosis not present

## 2019-05-18 DIAGNOSIS — I132 Hypertensive heart and chronic kidney disease with heart failure and with stage 5 chronic kidney disease, or end stage renal disease: Secondary | ICD-10-CM | POA: Diagnosis not present

## 2019-05-18 DIAGNOSIS — Z87891 Personal history of nicotine dependence: Secondary | ICD-10-CM | POA: Diagnosis not present

## 2019-05-18 DIAGNOSIS — E1142 Type 2 diabetes mellitus with diabetic polyneuropathy: Secondary | ICD-10-CM | POA: Diagnosis not present

## 2019-05-18 DIAGNOSIS — J449 Chronic obstructive pulmonary disease, unspecified: Secondary | ICD-10-CM | POA: Diagnosis not present

## 2019-05-18 DIAGNOSIS — E1122 Type 2 diabetes mellitus with diabetic chronic kidney disease: Secondary | ICD-10-CM

## 2019-05-18 DIAGNOSIS — N186 End stage renal disease: Secondary | ICD-10-CM

## 2019-05-18 DIAGNOSIS — Z9181 History of falling: Secondary | ICD-10-CM | POA: Diagnosis not present

## 2019-05-18 DIAGNOSIS — I442 Atrioventricular block, complete: Secondary | ICD-10-CM | POA: Diagnosis not present

## 2019-05-18 DIAGNOSIS — N401 Enlarged prostate with lower urinary tract symptoms: Secondary | ICD-10-CM | POA: Diagnosis not present

## 2019-05-18 DIAGNOSIS — B351 Tinea unguium: Secondary | ICD-10-CM | POA: Diagnosis not present

## 2019-05-18 DIAGNOSIS — K219 Gastro-esophageal reflux disease without esophagitis: Secondary | ICD-10-CM | POA: Diagnosis not present

## 2019-05-18 DIAGNOSIS — I2510 Atherosclerotic heart disease of native coronary artery without angina pectoris: Secondary | ICD-10-CM | POA: Diagnosis not present

## 2019-05-18 DIAGNOSIS — Z992 Dependence on renal dialysis: Secondary | ICD-10-CM | POA: Diagnosis not present

## 2019-05-18 DIAGNOSIS — M109 Gout, unspecified: Secondary | ICD-10-CM | POA: Diagnosis not present

## 2019-05-18 NOTE — Progress Notes (Signed)
Subjective:  Patient ID: Rodney Farley, male    DOB: December 27, 1931,  MRN: [id number]  Chief Complaint  Patient presents with  . Diabetes Mellitus    Diabetic foot care    85 y.o. male presents  for diabetic foot care. Last AMBS was unkown. Now on HD. Denies numbness and tingling in their feet. Denies cramping in legs and thighs.  Review of Systems: Negative except as noted in the HPI. Denies N/V/F/Ch.  Past Medical History:  Diagnosis Date  . AICD (automatic cardioverter/defibrillator) present   . AKI (acute kidney injury) ([place]) 03/27/2017  . Anemia   . Anginal pain ([place])   . Anxiety   . Arm pain 05/08/2015   LEFT ARM  . Arthritis   . Atrioventricular block, complete ([place])    a. 2010 s/p pacemaker.  . Bell's palsy    left side. after shingles episode  . Bilateral renal cysts 07/23/2017   Simple and hemorrhagic noted on CT ab/pelvis   . BPH associated with nocturia   . Cardiomegaly   . Chronic systolic CHF (congestive heart failure) ([place])    EF normalized by Echo 2019  . CKD (chronic kidney disease), stage IV ([place])   . COPD (chronic obstructive pulmonary disease) (H[facility])    Severe  . Coronary artery disease    a. s/p MI in 1994/1995 while in [place] s/p questionable PCI. 03/2015: progression of disease, for staged PCI.  [name] Depression   . Diabetic peripheral neuropathy ([place])   . Diarrhea   . Diverticulitis   . DOE (dyspnea on exertion) 03/28/2015  . Full dentures   . Gallstones   . GERD (gastroesophageal reflux disease)   . Gout   . Hard of hearing    B/L  . Heart murmur   . History of chronic pancreatitis 07/23/2017   noted on CT abd/pelvis  . History of shingles   . Hypercholesterolemia   . Hypertension   . Ischemic cardiomyopathy   . MI (myocardial infarction) ([place]) 1994; 1995  . Neuropathy    IN LOWER EXTREMITIES  . Nosebleed 10/06/2017   for 2 months most recent 10/06/2017  . Obesity   . Pacemaker    medtronic>>> MDT ICD 09/23/15  . Ruptured  appendicitis   . Sleep apnea    "sleeps w/humidifyer when he panics and gets short of breath" (04/08/2015)  . TIA (transient ischemic attack) X 3  . Trigger middle finger of left hand   . Type II diabetes mellitus ([place])   . Wears glasses   . Wears hearing aid     Current Outpatient Medications:  .  aspirin EC 81 MG tablet, Take 81 mg by mouth daily., Disp: , Rfl:  .  atorvastatin (LIPITOR) 40 MG tablet, Take 1 tablet (40 mg total) by mouth daily. (Patient taking differently: Take 40 mg by mouth at bedtime. ), Disp: 90 tablet, Rfl: 3 .  calcitRIOL (ROCALTROL) 0.25 MCG capsule, Take 0.25 mcg by mouth daily., Disp: , Rfl:  .  Carboxymethylcellul-Glycerin (LUBRICATING EYE DROPS OP), Place 1 drop into both eyes 3 (three) times daily as needed (dry eyes)., Disp: , Rfl:  .  carvedilol (COREG) 12.5 MG tablet, Take 12.5 mg by mouth 2 (two) times daily., Disp: , Rfl:  .  ciclopirox (PENLAC) 8 % solution, , Disp: , Rfl:  .  Epoetin Alfa (PROCRIT IJ), Inject 1 Dose as directed every Thursday. Through Dr. Justin Mend , Disp: , Rfl:  .  erythromycin ophthalmic ointment, Place 1 application into the  left eye at bedtime., Disp: , Rfl:  .  gabapentin (NEURONTIN) 100 MG capsule, Take 200 mg by mouth at bedtime., Disp: , Rfl:  .  glucose blood (FREESTYLE TEST STRIPS) test strip, Use to check blood sugar daily, Disp: 100 each, Rfl: 3 .  glucose monitoring kit (FREESTYLE) monitoring kit, USE TO MONITOR BLOOD GLUCOSE AS DIRECTED, Disp: 1 each, Rfl: 1 .  lansoprazole (PREVACID) 15 MG capsule, Take 1 capsule (15 mg total) by mouth daily at 12 noon., Disp: 90 capsule, Rfl: 3 .  nitroGLYCERIN (NITROSTAT) 0.4 MG SL tablet, Place 1 tablet (0.4 mg total) under the tongue every 5 (five) minutes as needed for chest pain. 2 doses before calling for emergent help, Disp: 25 tablet, Rfl: 3 .  oxymetazoline (AFRIN) 0.05 % nasal spray, Place 1 spray into both nostrils 2 (two) times daily as needed for congestion., Disp: , Rfl:  .   saccharomyces boulardii (FLORASTOR) 250 MG capsule, Take 1 capsule (250 mg total) by mouth 2 (two) times daily., Disp: 30 capsule, Rfl: 0 .  sertraline (ZOLOFT) 100 MG tablet, TAKE 1 & 1/2 (ONE & ONE-HALF) TABLETS BY MOUTH ONCE DAILY (Patient taking differently: Take 150 mg by mouth daily. ), Disp: 135 tablet, Rfl: 1 .  sevelamer carbonate (RENVELA) 800 MG tablet, Take 1,600 mg by mouth 3 (three) times daily., Disp: , Rfl:  .  SPS 15 GM/60ML suspension, , Disp: , Rfl:  .  triamcinolone cream (KENALOG) 0.1 %, , Disp: , Rfl:  .  vitamin B-12 (CYANOCOBALAMIN) 1000 MCG tablet, Take 1,000 mcg by mouth daily., Disp: , Rfl:   Social History   Tobacco Use  Smoking Status Former Smoker  . Packs/day: 1.50  . Years: 54.00  . Pack years: 81.00  . Types: Cigarettes  . Quit date: 07/18/2007  . Years since quitting: 11.8  Smokeless Tobacco Never Used  Tobacco Comment   did not discuss LdCT but being evaluated for chole currently    Allergies  Allergen Reactions  . Bee Venom Anaphylaxis  . Lyrica [Pregabalin] Other (See Comments)    hallucinations  . Prednisone Other (See Comments)    hallucinations  . Zocor [Simvastatin] Nausea Only and Other (See Comments)    Headache with brand name only.  Can take the generic.   Objective:   There were no vitals filed for this visit. There is no height or weight on file to calculate BMI. Constitutional Well developed. Well nourished.  Vascular Dorsalis pedis pulses present 1+ bilaterally  Posterior tibial pulses present 1+ bilaterally  Pedal hair growth diminished. Capillary refill normal to all digits.  No cyanosis or clubbing noted.  Neurologic Normal speech. Oriented to person, place, and time. Epicritic sensation to light touch grossly present bilaterally. Protective sensation with 5.07 monofilament  present bilaterally.  Dermatologic Nails elongated, thickened, dystrophic. Abrasions to the right second and third toes healing without warmth  erythema or signs of infection. No skin lesions.  Orthopedic: Normal joint ROM without pain or crepitus bilaterally. No visible deformities. No bony tenderness.   Assessment:   1. DM type 2 causing ESRD ([place])   2. Onychomycosis    Plan:  Patient was evaluated and treated and all questions answered.  Diabetes with ESRD, Onychomycosis -Educated on diabetic footcare. Diabetic risk level 1 -Nails x10 debrided sharply and manually with large nail nipper and rotary burr.   Procedure: Nail Debridement Rationale: Patient meets criteria for routine foot care due to ESRD Type of Debridement: manual, sharp debridement. Instrumentation:  Nail nipper, rotary burr. Number of Nails: 10  Return in about 3 months (around 08/18/2019) for Diabetic Foot Care.

## 2019-05-25 DIAGNOSIS — I5022 Chronic systolic (congestive) heart failure: Secondary | ICD-10-CM | POA: Diagnosis not present

## 2019-05-25 DIAGNOSIS — F331 Major depressive disorder, recurrent, moderate: Secondary | ICD-10-CM | POA: Diagnosis not present

## 2019-05-25 DIAGNOSIS — Z87891 Personal history of nicotine dependence: Secondary | ICD-10-CM | POA: Diagnosis not present

## 2019-05-25 DIAGNOSIS — K219 Gastro-esophageal reflux disease without esophagitis: Secondary | ICD-10-CM | POA: Diagnosis not present

## 2019-05-25 DIAGNOSIS — Z992 Dependence on renal dialysis: Secondary | ICD-10-CM | POA: Diagnosis not present

## 2019-05-25 DIAGNOSIS — E1142 Type 2 diabetes mellitus with diabetic polyneuropathy: Secondary | ICD-10-CM | POA: Diagnosis not present

## 2019-05-25 DIAGNOSIS — Z7982 Long term (current) use of aspirin: Secondary | ICD-10-CM | POA: Diagnosis not present

## 2019-05-25 DIAGNOSIS — M109 Gout, unspecified: Secondary | ICD-10-CM | POA: Diagnosis not present

## 2019-05-25 DIAGNOSIS — I442 Atrioventricular block, complete: Secondary | ICD-10-CM | POA: Diagnosis not present

## 2019-05-25 DIAGNOSIS — Z9181 History of falling: Secondary | ICD-10-CM | POA: Diagnosis not present

## 2019-05-25 DIAGNOSIS — I132 Hypertensive heart and chronic kidney disease with heart failure and with stage 5 chronic kidney disease, or end stage renal disease: Secondary | ICD-10-CM | POA: Diagnosis not present

## 2019-05-25 DIAGNOSIS — I255 Ischemic cardiomyopathy: Secondary | ICD-10-CM | POA: Diagnosis not present

## 2019-05-25 DIAGNOSIS — G4730 Sleep apnea, unspecified: Secondary | ICD-10-CM | POA: Diagnosis not present

## 2019-05-25 DIAGNOSIS — J449 Chronic obstructive pulmonary disease, unspecified: Secondary | ICD-10-CM | POA: Diagnosis not present

## 2019-05-25 DIAGNOSIS — I2510 Atherosclerotic heart disease of native coronary artery without angina pectoris: Secondary | ICD-10-CM | POA: Diagnosis not present

## 2019-05-25 DIAGNOSIS — E1122 Type 2 diabetes mellitus with diabetic chronic kidney disease: Secondary | ICD-10-CM | POA: Diagnosis not present

## 2019-05-25 DIAGNOSIS — N401 Enlarged prostate with lower urinary tract symptoms: Secondary | ICD-10-CM | POA: Diagnosis not present

## 2019-05-25 DIAGNOSIS — N186 End stage renal disease: Secondary | ICD-10-CM | POA: Diagnosis not present

## 2019-05-25 DIAGNOSIS — R351 Nocturia: Secondary | ICD-10-CM | POA: Diagnosis not present

## 2019-05-30 ENCOUNTER — Ambulatory Visit (INDEPENDENT_AMBULATORY_CARE_PROVIDER_SITE_OTHER): Payer: PPO | Admitting: Family Medicine

## 2019-05-30 ENCOUNTER — Encounter: Payer: Self-pay | Admitting: Family Medicine

## 2019-05-30 ENCOUNTER — Other Ambulatory Visit: Payer: Self-pay

## 2019-05-30 VITALS — BP 132/60 | HR 73 | Temp 98.0°F | Ht 68.0 in | Wt 202.0 lb

## 2019-05-30 DIAGNOSIS — Z794 Long term (current) use of insulin: Secondary | ICD-10-CM

## 2019-05-30 DIAGNOSIS — N186 End stage renal disease: Secondary | ICD-10-CM | POA: Diagnosis not present

## 2019-05-30 DIAGNOSIS — F331 Major depressive disorder, recurrent, moderate: Secondary | ICD-10-CM

## 2019-05-30 DIAGNOSIS — I25119 Atherosclerotic heart disease of native coronary artery with unspecified angina pectoris: Secondary | ICD-10-CM | POA: Diagnosis not present

## 2019-05-30 DIAGNOSIS — E1169 Type 2 diabetes mellitus with other specified complication: Secondary | ICD-10-CM | POA: Diagnosis not present

## 2019-05-30 DIAGNOSIS — I152 Hypertension secondary to endocrine disorders: Secondary | ICD-10-CM

## 2019-05-30 DIAGNOSIS — E1159 Type 2 diabetes mellitus with other circulatory complications: Secondary | ICD-10-CM | POA: Diagnosis not present

## 2019-05-30 DIAGNOSIS — E785 Hyperlipidemia, unspecified: Secondary | ICD-10-CM

## 2019-05-30 DIAGNOSIS — Z992 Dependence on renal dialysis: Secondary | ICD-10-CM | POA: Diagnosis not present

## 2019-05-30 DIAGNOSIS — I10 Essential (primary) hypertension: Secondary | ICD-10-CM | POA: Diagnosis not present

## 2019-05-30 DIAGNOSIS — E1122 Type 2 diabetes mellitus with diabetic chronic kidney disease: Secondary | ICD-10-CM | POA: Diagnosis not present

## 2019-05-30 MED ORDER — MIRTAZAPINE 7.5 MG PO TABS: 3.7500 mg | ORAL_TABLET | Freq: Every day | ORAL | 5 refills | Status: DC

## 2019-05-30 NOTE — Assessment & Plan Note (Signed)
S: compliant with carvedilol 12.5 mg BID- skips AM dose if has dialysis due to low blood pressures BP Readings from Last 3 Encounters:  05/30/19 132/60  05/02/19 (!) 129/54  04/13/19 130/60  A/P: doing well- continue current medicine

## 2019-05-30 NOTE — Patient Instructions (Addendum)
Please see if dialysis can draw a hemoglobin a1c and lipid panel sometime in January 2021 and send me a copy because you will be due for both at that time. Can order under diabetes E11.22.   Please trial low dose mirtazapine/remeron before bed- half tablet only to start and can use full tablet only if needed/half dose not helpful. The hope is this may reduce itch, help with sleep. It can increase appetite and cause weight gain so please stop if you note these issues.   Please avoid daytime naps and try to get 10 hours of sleep each night instead   Recommended follow up: 3 month follow up or sooner if needed

## 2019-05-30 NOTE — Assessment & Plan Note (Signed)
S: reasonable control on sertraline 150mg  daily- especially in light of covid 19 and dialysis  Poor sleep - sleeping through day and not sleeping at night well Depression screen [facility] 2/9 05/30/2019  Decreased Interest 1  Down, Depressed, Hopeless 1  PHQ - 2 Score 2  Altered sleeping 1  Tired, decreased energy 0  Change in appetite 0  Feeling bad or failure about yourself  1  Trouble concentrating 1  Moving slowly or fidgety/restless 0  Suicidal thoughts 0  PHQ-9 Score 5  Difficult doing work/chores Somewhat difficult  Some recent data might be hidden  A/P: doing well- continue current meds- impressed wit control given current obstacles but he would like to have healthier sleep patterns  Sleep cycle is off- from avs "Please trial low dose mirtazapine/remeron before bed. The hope is this may reduce itch, help with sleep. It can increase appetite and cause weight gain so please stop if you note these issues. "

## 2019-05-30 NOTE — Progress Notes (Signed)
Phone [phone] In person visit   Subjective:   Rodney Farley is a 85 y.o. year old very pleasant male patient who presents for/with See problem oriented charting Chief Complaint  Patient presents with  . Follow-up   ROS- mild shortness of breath in cold weather. No chest pain. Stable edema. No fever/chills   Past Medical History-  Patient Active Problem List   Diagnosis Date Noted  . Ruptured appendicitis 03/26/2017    Priority: High  . Marital stress 02/18/2017    Priority: High  . COPD (chronic obstructive pulmonary disease) ([place])     Priority: High  . Chronic systolic CHF (congestive heart failure) ([place]) 04/08/2015    Priority: High  . DOE (dyspnea on exertion), due to significant CAD 03/28/2015    Priority: High  . ESRD on hemodialysis ([place]) 03/04/2015    Priority: High  . Coronary artery disease involving native coronary artery of native heart with angina pectoris ([place]) 09/13/2014    Priority: High  . Depression, major, recurrent, moderate ([place]) 08/05/2012    Priority: High  . Controlled type 2 diabetes mellitus with chronic kidney disease on chronic dialysis ([place]) 08/05/2012    Priority: High  . Ischemic cardiomyopathy 11/03/2011    Priority: High  . Atrioventricular block, complete (HCC)     Priority: High  . Pacemaker     Priority: High  . Hyperlipidemia associated with type 2 diabetes mellitus ([place]) 04/13/2018    Priority: Medium  . BPH associated with nocturia 04/27/2016    Priority: Medium  . Diabetic peripheral neuropathy ([facility])     Priority: Medium  . Bell's palsy     Priority: Medium  . Gout 06/26/2015    Priority: Medium  . Sleep apnea 08/05/2012    Priority: Medium  . Hypertension associated with diabetes ([place]) 08/05/2012    Priority: Medium  . S/P cholecystectomy 10/13/2017    Priority: Low  . Trigger finger 06/10/2016    Priority: Low  . Diverticulitis 04/08/2015    Priority: Low  . GERD (gastroesophageal reflux disease)  08/07/2012    Priority: Low  . Obesity     Priority: Low  . Thrombocytopenia ([place]) 04/13/2019  . Hyperkalemia, diminished renal excretion 04/01/2019  . Diarrhea 05/11/2017    Medications- reviewed and updated Current Outpatient Medications  Medication Sig Dispense Refill  . Carboxymethylcellul-Glycerin (LUBRICATING EYE DROPS OP) Place 1 drop into both eyes 3 (three) times daily as needed (dry eyes).    . carvedilol (COREG) 12.5 MG tablet Take 12.5 mg by mouth 2 (two) times daily.    . ciclopirox (PENLAC) 8 % solution     . erythromycin ophthalmic ointment Place 1 application into the left eye at bedtime.    [name] glucose blood (FREESTYLE TEST STRIPS) test strip Use to check blood sugar daily 100 each 3  . glucose monitoring kit (FREESTYLE) monitoring kit USE TO MONITOR BLOOD GLUCOSE AS DIRECTED 1 each 1  . lansoprazole (PREVACID) 15 MG capsule Take 1 capsule (15 mg total) by mouth daily at 12 noon. 90 capsule 3  . nitroGLYCERIN (NITROSTAT) 0.4 MG SL tablet Place 1 tablet (0.4 mg total) under the tongue every 5 (five) minutes as needed for chest pain. 2 doses before calling for emergent help 25 tablet 3  . oxymetazoline (AFRIN) 0.05 % nasal spray Place 1 spray into both nostrils 2 (two) times daily as needed for congestion.    . saccharomyces boulardii (FLORASTOR) 250 MG capsule Take 1 capsule (250 mg total) by mouth  2 (two) times daily. 30 capsule 0  . sertraline (ZOLOFT) 100 MG tablet TAKE 1 & 1/2 (ONE & ONE-HALF) TABLETS BY MOUTH ONCE DAILY (Patient taking differently: Take 150 mg by mouth daily. ) 135 tablet 1  . sevelamer carbonate (RENVELA) 800 MG tablet Take 1,600 mg by mouth 3 (three) times daily.    . SPS 15 GM/60ML suspension     . triamcinolone cream (KENALOG) 0.1 %     . vitamin B-12 (CYANOCOBALAMIN) 1000 MCG tablet Take 1,000 mcg by mouth daily.    [name] aspirin EC 81 MG tablet Take 81 mg by mouth daily.    [name] atorvastatin (LIPITOR) 40 MG tablet Take 1 tablet (40 mg total) by mouth  daily. (Patient taking differently: Take 40 mg by mouth at bedtime. ) 90 tablet 3  . mirtazapine (REMERON) 7.5 MG tablet Take 0.5-1 tablets (3.75-7.5 mg total) by mouth at bedtime. 30 tablet 5   No current facility-administered medications for this visit.      Objective:  BP 132/60   Pulse 73   Temp 98 F (36.7 C)   Ht 5' 8" (1.727 m)   Wt 202 lb (91.6 kg)   SpO2 99%   BMI 30.71 kg/m  Gen: NAD, resting comfortably CV: RRR no murmurs rubs or gallops Lungs: CTAB no crackles, wheeze, rhonchi Abdomen: soft/nontender/nondistended/normal bowel sounds. Ext: trace edema Skin: warm, dry     Assessment and Plan   # Seeing Lavell Anchors through [facility]. He has enjoyed the visits.   # Diabetes S: diet controlled CBGs- brings me readings for last few monts. Vast majority of #s between 80-180. Occasionally up to 190s or 200s but very rare Exercise and diet- trying to eat a healthy diet- states works hard at it [employer] Value Date   HGBA1C 5.7 (H) 04/02/2019   HGBA1C 5.3 08/03/2018   HGBA1C 5.3 04/13/2018   A/P: well controlled- he will stay off insulin and all diabetes medicines for now and we can repeat a1c next visit-we are going to try to get an A1c report from dialysis in January  #hypertension S: compliant with carvedilol 12.5 mg BID- skips AM dose if has dialysis due to low blood pressures BP Readings from Last 3 Encounters:  05/30/19 132/60  05/02/19 (!) 129/54  04/13/19 130/60  A/P: doing well- continue current medicine  # Depression S: reasonable control on sertraline 110m daily- especially in light of covid 19 and dialysis  Poor sleep - sleeping through day and not sleeping at night well [facility] 05/30/2019  Decreased Interest 1  Down, Depressed, Hopeless 1  PHQ - 2 Score 2  Altered sleeping 1  Tired, decreased energy 0  Change in appetite 0  Feeling bad or failure about yourself  1  Trouble concentrating 1  Moving  slowly or fidgety/restless 0  Suicidal thoughts 0  PHQ-9 Score 5  Difficult doing work/chores Somewhat difficult  Some recent data might be hidden  A/P: doing well- continue current meds- impressed wit control given current obstacles but he would like to have healthier sleep patterns  Sleep cycle is off- from avs "Please trial low dose mirtazapine/remeron before bed. The hope is this may reduce itch, help with sleep. It can increase appetite and cause weight gain so please stop if you note these issues. "  #hyperlipidemia/CAD S: compliant with atorvastatin 447m  Takes aspirin non dialysis days. Mild SOB in cold weather.  Lab Results  Component Value Date  CHOL 167 04/13/2018   HDL 33.00 (L) 04/13/2018   LDLCALC 65 06/26/2015   LDLDIRECT 60.0 04/13/2018   TRIG 316.0 (H) 04/13/2018   CHOLHDL 5 04/13/2018   A/P: We will try to get an updated lipid panel through dialysis in January along with an A1c-can write formal order if needed.  Hopefully lipids controlled  CAD appears stable-no obvious worsening symptoms.  Tends to get some shortness of breath in cold weather.  Recommended follow up: 3 month follow up or sooner if needed Future Appointments  Date Time Provider [place]  06/20/2019  8:30 AM [facility] DEVICE REMOTES [facility]  06/22/2019  2:45 PM Deboraha Sprang, MD [facility]  08/24/2019 11:15 AM Evelina Bucy, DPM [facility]  08/31/2019 11:20 AM [name], MD [facility]  09/19/2019  8:30 AM [facility] DEVICE REMOTES [facility]  12/19/2019  8:30 AM [facility] DEVICE REMOTES [facility]  03/19/2020  8:30 AM [facility] DEVICE REMOTES [facility]    Lab/Order associations:   ICD-10-CM   1. Controlled type 2 diabetes mellitus with chronic kidney disease on chronic dialysis, with long-term current use of insulin (HCC)  E11.22    N18.6    Z79.4    Z99.2   2. Depression, major,  recurrent, moderate ([place])  F33.1   3. Coronary artery disease involving native coronary artery of native heart with angina pectoris ([place])  I25.119   4. Hypertension associated with diabetes ([place])  E11.59    I10   5. Hyperlipidemia associated with type 2 diabetes mellitus ([place])  E11.69    E78.5    Meds ordered this encounter  Medications  . mirtazapine (REMERON) 7.5 MG tablet    Sig: Take 0.5-1 tablets (3.75-7.5 mg total) by mouth at bedtime.    Dispense:  30 tablet    Refill:  5   Return precautions advised.  Garret Reddish, MD

## 2019-05-30 NOTE — Assessment & Plan Note (Signed)
S: diet controlled CBGs- brings me readings for last few monts. Vast majority of #s between 80-180. Occasionally up to 190s or 200s but very rare Exercise and diet- trying to eat a healthy diet- states works hard at it [employer] Value Date   HGBA1C 5.7 (H) 04/02/2019   HGBA1C 5.3 08/03/2018   HGBA1C 5.3 04/13/2018   A/P: well controlled- he will stay off insulin and all diabetes medicines for now and we can repeat a1c next visit-we are going to try to get an A1c report from dialysis in January

## 2019-06-09 LAB — BASIC METABOLIC PANEL
BUN: 72 — AB (ref 4–21)
Chloride: 103 (ref 99–108)
Potassium: 5 (ref 3.4–5.3)

## 2019-06-12 ENCOUNTER — Other Ambulatory Visit: Payer: Self-pay | Admitting: Family Medicine

## 2019-06-12 DIAGNOSIS — E1129 Type 2 diabetes mellitus with other diabetic kidney complication: Secondary | ICD-10-CM | POA: Diagnosis not present

## 2019-06-12 DIAGNOSIS — Z992 Dependence on renal dialysis: Secondary | ICD-10-CM | POA: Diagnosis not present

## 2019-06-12 DIAGNOSIS — N186 End stage renal disease: Secondary | ICD-10-CM | POA: Diagnosis not present

## 2019-06-14 DIAGNOSIS — D509 Iron deficiency anemia, unspecified: Secondary | ICD-10-CM | POA: Diagnosis not present

## 2019-06-14 DIAGNOSIS — D688 Other specified coagulation defects: Secondary | ICD-10-CM | POA: Diagnosis not present

## 2019-06-14 DIAGNOSIS — D689 Coagulation defect, unspecified: Secondary | ICD-10-CM | POA: Diagnosis not present

## 2019-06-14 DIAGNOSIS — T8249XA Other complication of vascular dialysis catheter, initial encounter: Secondary | ICD-10-CM | POA: Diagnosis not present

## 2019-06-14 DIAGNOSIS — N186 End stage renal disease: Secondary | ICD-10-CM | POA: Diagnosis not present

## 2019-06-14 DIAGNOSIS — Z992 Dependence on renal dialysis: Secondary | ICD-10-CM | POA: Diagnosis not present

## 2019-06-14 DIAGNOSIS — D631 Anemia in chronic kidney disease: Secondary | ICD-10-CM | POA: Diagnosis not present

## 2019-06-14 DIAGNOSIS — N2581 Secondary hyperparathyroidism of renal origin: Secondary | ICD-10-CM | POA: Diagnosis not present

## 2019-06-14 DIAGNOSIS — E876 Hypokalemia: Secondary | ICD-10-CM | POA: Diagnosis not present

## 2019-06-20 ENCOUNTER — Ambulatory Visit (INDEPENDENT_AMBULATORY_CARE_PROVIDER_SITE_OTHER): Payer: PPO | Admitting: *Deleted

## 2019-06-20 DIAGNOSIS — I5022 Chronic systolic (congestive) heart failure: Secondary | ICD-10-CM

## 2019-06-20 LAB — CUP PACEART REMOTE DEVICE CHECK
Battery Remaining Longevity: 18 mo
Battery Voltage: 2.92 V
Brady Statistic AP VP Percent: 85.71 %
Brady Statistic AP VS Percent: 0.98 %
Brady Statistic AS VP Percent: 12.37 %
Brady Statistic AS VS Percent: 0.94 %
Brady Statistic RA Percent Paced: 84.83 %
Brady Statistic RV Percent Paced: 96.93 %
Date Time Interrogation Session: 20201208044223
HighPow Impedance: 72 Ohm
Implantable Lead Implant Date: 20100112
Implantable Lead Implant Date: 20170313
Implantable Lead Implant Date: 20170313
Implantable Lead Location: 753858
Implantable Lead Location: 753859
Implantable Lead Location: 753860
Implantable Lead Model: 4598
Implantable Lead Model: 5076
Implantable Pulse Generator Implant Date: 20170313
Lead Channel Impedance Value: 361 Ohm
Lead Channel Impedance Value: 399 Ohm
Lead Channel Impedance Value: 399 Ohm
Lead Channel Impedance Value: 418 Ohm
Lead Channel Impedance Value: 418 Ohm
Lead Channel Impedance Value: 456 Ohm
Lead Channel Impedance Value: 475 Ohm
Lead Channel Impedance Value: 551 Ohm
Lead Channel Impedance Value: 665 Ohm
Lead Channel Impedance Value: 703 Ohm
Lead Channel Impedance Value: 722 Ohm
Lead Channel Impedance Value: 760 Ohm
Lead Channel Impedance Value: 760 Ohm
Lead Channel Pacing Threshold Amplitude: 0.5 V
Lead Channel Pacing Threshold Amplitude: 1.125 V
Lead Channel Pacing Threshold Amplitude: 1.25 V
Lead Channel Pacing Threshold Pulse Width: 0.4 ms
Lead Channel Pacing Threshold Pulse Width: 0.4 ms
Lead Channel Pacing Threshold Pulse Width: 0.4 ms
Lead Channel Sensing Intrinsic Amplitude: 1.375 mV
Lead Channel Sensing Intrinsic Amplitude: 1.375 mV
Lead Channel Sensing Intrinsic Amplitude: 11.625 mV
Lead Channel Sensing Intrinsic Amplitude: 11.625 mV
Lead Channel Setting Pacing Amplitude: 2.5 V
Lead Channel Setting Pacing Amplitude: 2.5 V
Lead Channel Setting Pacing Amplitude: 2.75 V
Lead Channel Setting Pacing Pulse Width: 0.4 ms
Lead Channel Setting Pacing Pulse Width: 0.4 ms
Lead Channel Setting Sensing Sensitivity: 0.3 mV

## 2019-06-22 ENCOUNTER — Ambulatory Visit: Payer: PPO | Admitting: Internal Medicine

## 2019-06-22 ENCOUNTER — Encounter: Payer: Self-pay | Admitting: Internal Medicine

## 2019-06-22 ENCOUNTER — Other Ambulatory Visit: Payer: Self-pay

## 2019-06-22 VITALS — BP 104/52 | HR 71 | Ht 68.0 in | Wt 201.2 lb

## 2019-06-22 DIAGNOSIS — I5022 Chronic systolic (congestive) heart failure: Secondary | ICD-10-CM

## 2019-06-22 DIAGNOSIS — I2510 Atherosclerotic heart disease of native coronary artery without angina pectoris: Secondary | ICD-10-CM | POA: Diagnosis not present

## 2019-06-22 DIAGNOSIS — I442 Atrioventricular block, complete: Secondary | ICD-10-CM | POA: Diagnosis not present

## 2019-06-22 DIAGNOSIS — Z95810 Presence of automatic (implantable) cardiac defibrillator: Secondary | ICD-10-CM | POA: Diagnosis not present

## 2019-06-22 NOTE — Progress Notes (Signed)
Patient Care Team: [name], MD as PCP - General (Family Medicine) [name], Hien M, MD as PCP - Cardiology (Cardiology)   HPI  Rodney Farley is a 85 y.o. male Seen in follow-up for a pacemaker implanted in 2010 elsewhere. We saw him to establish in 2013. He has a history of complete heart block. He underwent CRT-D upgrade 3/17.  He has history of ischemic heart disease with prior MI.   I have not PVCs although the final programming is here aerobic programming 0  DATE TEST EF   3/19 Echo  35-40%   12/16 Echo  25-30%   7/17 Echo   65 %   4/19 Echo   60-65 %   4/19 Myoview 45-50% Scar   No chest pain.  No edema.  Some orthostasis.  Hypotension with dialysis.      Past Medical History:  Diagnosis Date  . AICD (automatic cardioverter/defibrillator) present   . AKI (acute kidney injury) ([place]) 03/27/2017  . Anemia   . Anginal pain ([place])   . Anxiety   . Arm pain 05/08/2015   LEFT ARM  . Arthritis   . Atrioventricular block, complete ([place])    a. 2010 s/p pacemaker.  . Bell's palsy    left side. after shingles episode  . Bilateral renal cysts 07/23/2017   Simple and hemorrhagic noted on CT ab/pelvis   . BPH associated with nocturia   . Cardiomegaly   . Chronic systolic CHF (congestive heart failure) ([place])    EF normalized by Echo 2019  . CKD (chronic kidney disease), stage IV ([place])   . COPD (chronic obstructive pulmonary disease) (HCC)    Severe  . Coronary artery disease    a. s/p MI in 1994/1995 while in [place] s/p questionable PCI. 03/2015: progression of disease, for staged PCI.  [name] Depression   . Diabetic peripheral neuropathy ([place])   . Diarrhea   . Diverticulitis   . DOE (dyspnea on exertion) 03/28/2015  . Full dentures   . Gallstones   . GERD (gastroesophageal reflux disease)   . Gout   . Hard of hearing    B/L  . Heart murmur   . History of chronic pancreatitis 07/23/2017   noted on CT abd/pelvis  . History of shingles   .  Hypercholesterolemia   . Hypertension   . Ischemic cardiomyopathy   . MI (myocardial infarction) ([place]) 1994; 1995  . Neuropathy    IN LOWER EXTREMITIES  . Nosebleed 10/06/2017   for 2 months most recent 10/06/2017  . Obesity   . Pacemaker    medtronic>>> MDT ICD 09/23/15  . Ruptured appendicitis   . Sleep apnea    "sleeps w/humidifyer when he panics and gets short of breath" (04/08/2015)  . TIA (transient ischemic attack) X 3  . Trigger middle finger of left hand   . Type II diabetes mellitus ([place])   . Wears glasses   . Wears hearing aid     Past Surgical History:  Procedure Laterality Date  . APPENDECTOMY    . AV FISTULA PLACEMENT Right 02/07/2019   Procedure: ARTERIOVENOUS (AV) FISTULA CREATION RIGHT UPPER ARM;  Surgeon: Waynetta Sandy, MD;  Location: [facility];  Service: Vascular;  Laterality: Right;  . CARDIAC CATHETERIZATION N/A 03/29/2015   Procedure: Right/Left Heart Cath and Coronary Angiography;  Surgeon: Fern M [name], MD;  Location: [facility];  Service: Cardiovascular;  Laterality: N/A;  . CARDIAC CATHETERIZATION  1995   "  after my MI; put me on heart RX after cath"  . CARDIAC CATHETERIZATION N/A 04/09/2015   Procedure: Coronary Stent Intervention;  Surgeon: Nihar M [name], MD;  Location: [facility];  Service: Cardiovascular;  Laterality: N/A;  . CATARACT EXTRACTION W/ INTRAOCULAR LENS  IMPLANT, BILATERAL    . CHOLECYSTECTOMY N/A 10/13/2017   Procedure: LAPAROSCOPIC CHOLECYSTECTOMY WITH LYSIS OF ADHESIONS;  Surgeon: Ileana Roup, MD;  Location: [facility];  Service: General;  Laterality: N/A;  . COLONOSCOPY    . DENTAL SURGERY    . EP IMPLANTABLE DEVICE N/A 09/23/2015   MDT CRT-D, Dr. Caryl Comes  . HIATAL HERNIA REPAIR  1977  . ILEOCECETOMY N/A 03/27/2017   Procedure: ILEOCECECTOMY;  Surgeon: Ileana Roup, MD;  Location: [facility];  Service: General;  Laterality: N/A;  . INSERT / REPLACE / REMOVE PACEMAKER  07/2008   Complete heart block status  post DDD with good function  . IR FLUORO GUIDE CV LINE RIGHT  04/03/2019  . IR US GUIDE VASC ACCESS RIGHT  04/03/2019  . LAPAROTOMY N/A 03/27/2017   Procedure: EXPLORATORY LAPAROTOMY;  Surgeon: Ileana Roup, MD;  Location: [facility];  Service: General;  Laterality: N/A;  . TONSILLECTOMY    . UPPER GI ENDOSCOPY      Current Outpatient Medications  Medication Sig Dispense Refill  . aspirin EC 81 MG tablet Take 81 mg by mouth daily.    [name] atorvastatin (LIPITOR) 40 MG tablet Take 1 tablet by mouth once daily 90 tablet 0  . Carboxymethylcellul-Glycerin (LUBRICATING EYE DROPS OP) Place 1 drop into both eyes 3 (three) times daily as needed (dry eyes).    . carvedilol (COREG) 12.5 MG tablet Take 12.5 mg by mouth 2 (two) times daily.    . ciclopirox (PENLAC) 8 % solution     . erythromycin ophthalmic ointment Place 1 application into the left eye at bedtime.    [name] glucose blood (FREESTYLE TEST STRIPS) test strip Use to check blood sugar daily 100 each 3  . glucose monitoring kit (FREESTYLE) monitoring kit USE TO MONITOR BLOOD GLUCOSE AS DIRECTED 1 each 1  . lansoprazole (PREVACID) 15 MG capsule Take 1 capsule (15 mg total) by mouth daily at 12 noon. 90 capsule 3  . mirtazapine (REMERON) 7.5 MG tablet Take 0.5-1 tablets (3.75-7.5 mg total) by mouth at bedtime. 30 tablet 5  . nitroGLYCERIN (NITROSTAT) 0.4 MG SL tablet Place 1 tablet (0.4 mg total) under the tongue every 5 (five) minutes as needed for chest pain. 2 doses before calling for emergent help 25 tablet 3  . oxymetazoline (AFRIN) 0.05 % nasal spray Place 1 spray into both nostrils 2 (two) times daily as needed for congestion.    . saccharomyces boulardii (FLORASTOR) 250 MG capsule Take 1 capsule (250 mg total) by mouth 2 (two) times daily. 30 capsule 0  . sertraline (ZOLOFT) 100 MG tablet TAKE 1 & 1/2 (ONE & ONE-HALF) TABLETS BY MOUTH ONCE DAILY (Patient taking differently: Take 150 mg by mouth daily. ) 135 tablet 1  . sevelamer carbonate  (RENVELA) 800 MG tablet Take 1,600 mg by mouth 3 (three) times daily.    . SPS 15 GM/60ML suspension     . triamcinolone cream (KENALOG) 0.1 %     . vitamin B-12 (CYANOCOBALAMIN) 1000 MCG tablet Take 1,000 mcg by mouth daily.     No current facility-administered medications for this visit.    Allergies  Allergen Reactions  . Bee Venom Anaphylaxis  . Lyrica [  Pregabalin] Other (See Comments)    hallucinations  . Prednisone Other (See Comments)    hallucinations  . Zocor [Simvastatin] Nausea Only and Other (See Comments)    Headache with brand name only.  Can take the generic.    Review of Systems negative except from HPI and PMH  Physical Exam BP (!) 104/52   Pulse 71   Ht 5' 8" (1.727 m)   Wt 201 lb 3.2 oz (91.3 kg)   SpO2 98%   BMI 30.59 kg/m  Well developed and nourished in no acute distress HENT normal  Ptosis with bells palsy Neck supple with JVP  Clear Regular rate and rhythm, no murmurs or gallops Abd-soft with active BS No Clubbing cyanosis edema Skin-warm and dry A & Oriented  Grossly normal sensory and motor function  ECG AV pacing upright QRS lead V1 1 --lead I     Assessment and  Plan Complete heart block  Ischemic heart disease with prior MI  ICD-CRT  [name] device was interrogated.  The information was reviewed. No changes were made in the programming.     CHF chronic diastolic  Renal insufficiency ESRD   Without symptoms of ischemia  Dyspnea on exertion may in part be related to exuberant heart rate response; have reprogrammed his rate response from 3--2 and his ADL rate from 95--90 and exertional rate 120--110  Hypotension with dialysis.

## 2019-06-22 NOTE — Patient Instructions (Signed)
Medication Instructions:  Your physician recommends that you continue on your current medications as directed. Please refer to the Current Medication list given to you today.  Labwork: None ordered.  Testing/Procedures: None ordered.  Follow-Up: Your physician recommends that you schedule a follow-up appointment in:   12 months with Dr. Klein  Any Other Special Instructions Will Be Listed Below (If Applicable).     If you need a refill on your cardiac medications before your next appointment, please call your pharmacy.  

## 2019-07-03 ENCOUNTER — Telehealth: Payer: Self-pay | Admitting: Family Medicine

## 2019-07-03 MED ORDER — CARVEDILOL 6.25 MG PO TABS: 6.2500 mg | ORAL_TABLET | Freq: Two times a day (BID) | ORAL | 3 refills | Status: DC

## 2019-07-03 NOTE — Telephone Encounter (Signed)
Two topics from letter from patient   1.  Received a letter from patient stating he has been having lower blood pressure particularly at the end of dialysis sessions.  He has been held for up to 1-1/2 hours after his sessions as a result. -He gives me a copy of home readings which show blood pressure as low as 70s to 90s after dialysis systolic.  Blood pressures otherwise range primarily in the 130s and 140s but can get as low as the 100s and as high as the [id number] systolic -I have reduced his carvedilol to 6.25 mg twice a day and continue to hold on mornings he has dialysis.  I am forwarding this message to Dr. [name] as an [place]   2.Patient is also wondering if he can get vaccination-I do not see a reason why he cannot get the vaccination but we do not have it in supply at present-hopefully we will get more information in the coming weeks about vaccinations for our community members.

## 2019-07-05 NOTE — Telephone Encounter (Signed)
Called and lm on pt vm tcb regarding below information. 

## 2019-07-09 ENCOUNTER — Encounter: Payer: Self-pay | Admitting: Family Medicine

## 2019-07-10 NOTE — Telephone Encounter (Signed)
Pt sent a mychart and I copied and pasted the below information to the patient message in [place].

## 2019-07-13 DIAGNOSIS — Z992 Dependence on renal dialysis: Secondary | ICD-10-CM | POA: Diagnosis not present

## 2019-07-13 DIAGNOSIS — N186 End stage renal disease: Secondary | ICD-10-CM | POA: Diagnosis not present

## 2019-07-13 DIAGNOSIS — E1129 Type 2 diabetes mellitus with other diabetic kidney complication: Secondary | ICD-10-CM | POA: Diagnosis not present

## 2019-07-15 DIAGNOSIS — T8249XA Other complication of vascular dialysis catheter, initial encounter: Secondary | ICD-10-CM | POA: Diagnosis not present

## 2019-07-15 DIAGNOSIS — D509 Iron deficiency anemia, unspecified: Secondary | ICD-10-CM | POA: Diagnosis not present

## 2019-07-15 DIAGNOSIS — Z992 Dependence on renal dialysis: Secondary | ICD-10-CM | POA: Diagnosis not present

## 2019-07-15 DIAGNOSIS — E876 Hypokalemia: Secondary | ICD-10-CM | POA: Diagnosis not present

## 2019-07-15 DIAGNOSIS — D688 Other specified coagulation defects: Secondary | ICD-10-CM | POA: Diagnosis not present

## 2019-07-15 DIAGNOSIS — D689 Coagulation defect, unspecified: Secondary | ICD-10-CM | POA: Diagnosis not present

## 2019-07-15 DIAGNOSIS — N2581 Secondary hyperparathyroidism of renal origin: Secondary | ICD-10-CM | POA: Diagnosis not present

## 2019-07-15 DIAGNOSIS — E1129 Type 2 diabetes mellitus with other diabetic kidney complication: Secondary | ICD-10-CM | POA: Diagnosis not present

## 2019-07-15 DIAGNOSIS — N186 End stage renal disease: Secondary | ICD-10-CM | POA: Diagnosis not present

## 2019-07-18 DIAGNOSIS — Z452 Encounter for adjustment and management of vascular access device: Secondary | ICD-10-CM | POA: Diagnosis not present

## 2019-07-24 ENCOUNTER — Telehealth: Payer: Self-pay | Admitting: Family Medicine

## 2019-07-24 NOTE — Telephone Encounter (Signed)
Please call for app  

## 2019-07-24 NOTE — Telephone Encounter (Signed)
Do you want me to call for app?

## 2019-07-24 NOTE — Telephone Encounter (Signed)
Yes thanks 

## 2019-07-24 NOTE — Telephone Encounter (Signed)
Rodney Farley is calling in saying that Rodney Farley states he hasnt heard back of what to do after seeing them, but she said that she is concerned that he is showing symptoms of depression

## 2019-07-25 NOTE — Progress Notes (Signed)
ICD remote 

## 2019-07-25 NOTE — Telephone Encounter (Signed)
Called and scheduled appt for 1/19

## 2019-07-27 ENCOUNTER — Telehealth: Payer: Self-pay | Admitting: Family Medicine

## 2019-07-27 NOTE — Telephone Encounter (Signed)
I left a message asking the patient to call and schedule Medicare AWV with Loma Sousa ([place]) on 08/01/2019 after seeing Dr. Yong Channel.  Im waiting for a call back to either confirm or decline the appointment. If patient calls back, please update appointment notes. Last AWV 08/12/17 [name] ([place])

## 2019-08-01 ENCOUNTER — Ambulatory Visit (INDEPENDENT_AMBULATORY_CARE_PROVIDER_SITE_OTHER): Payer: PPO

## 2019-08-01 ENCOUNTER — Other Ambulatory Visit: Payer: Self-pay

## 2019-08-01 ENCOUNTER — Encounter: Payer: Self-pay | Admitting: Family Medicine

## 2019-08-01 ENCOUNTER — Ambulatory Visit (INDEPENDENT_AMBULATORY_CARE_PROVIDER_SITE_OTHER): Payer: PPO | Admitting: Family Medicine

## 2019-08-01 VITALS — BP 126/62 | Ht 68.0 in | Wt 203.8 lb

## 2019-08-01 VITALS — BP 128/62 | Ht 68.0 in | Wt 203.7 lb

## 2019-08-01 DIAGNOSIS — I10 Essential (primary) hypertension: Secondary | ICD-10-CM

## 2019-08-01 DIAGNOSIS — F331 Major depressive disorder, recurrent, moderate: Secondary | ICD-10-CM

## 2019-08-01 DIAGNOSIS — N186 End stage renal disease: Secondary | ICD-10-CM

## 2019-08-01 DIAGNOSIS — Z992 Dependence on renal dialysis: Secondary | ICD-10-CM

## 2019-08-01 DIAGNOSIS — E1122 Type 2 diabetes mellitus with diabetic chronic kidney disease: Secondary | ICD-10-CM

## 2019-08-01 DIAGNOSIS — E1159 Type 2 diabetes mellitus with other circulatory complications: Secondary | ICD-10-CM | POA: Diagnosis not present

## 2019-08-01 DIAGNOSIS — Z0000 Encounter for general adult medical examination without abnormal findings: Secondary | ICD-10-CM | POA: Diagnosis not present

## 2019-08-01 NOTE — Progress Notes (Signed)
Phone [phone] In person visit   Subjective:   Rodney Farley is a 86 y.o. year old very pleasant male patient who presents for/with See problem oriented charting Chief Complaint  Patient presents with  . Follow-up    This visit occurred during the SARS-CoV-2 public health emergency.  Safety protocols were in place, including screening questions prior to the visit, additional usage of staff PPE, and extensive cleaning of exam room while observing appropriate contact time as indicated for disinfecting solutions.   Past Medical History-  Patient Active Problem List   Diagnosis Date Noted  . Ruptured appendicitis 03/26/2017    Priority: High  . Marital stress 02/18/2017    Priority: High  . COPD (chronic obstructive pulmonary disease) ([place])     Priority: High  . Chronic systolic CHF (congestive heart failure) ([place]) 04/08/2015    Priority: High  . DOE (dyspnea on exertion), due to significant CAD 03/28/2015    Priority: High  . ESRD on hemodialysis ([place]) 03/04/2015    Priority: High  . Coronary artery disease involving native coronary artery of native heart with angina pectoris ([place]) 09/13/2014    Priority: High  . Depression, major, recurrent, moderate ([place]) 08/05/2012    Priority: High  . Controlled type 2 diabetes mellitus with chronic kidney disease on chronic dialysis ([place]) 08/05/2012    Priority: High  . Ischemic cardiomyopathy 11/03/2011    Priority: High  . Atrioventricular block, complete (HCC)     Priority: High  . Pacemaker     Priority: High  . Hyperlipidemia associated with type 2 diabetes mellitus ([place]) 04/13/2018    Priority: Medium  . BPH associated with nocturia 04/27/2016    Priority: Medium  . Diabetic peripheral neuropathy (HCC)     Priority: Medium  . Bell's palsy     Priority: Medium  . Gout 06/26/2015    Priority: Medium  . Sleep apnea 08/05/2012    Priority: Medium  . Hypertension associated with diabetes ([place]) 08/05/2012   Priority: Medium  . S/P cholecystectomy 10/13/2017    Priority: Low  . Trigger finger 06/10/2016    Priority: Low  . Diverticulitis 04/08/2015    Priority: Low  . GERD (gastroesophageal reflux disease) 08/07/2012    Priority: Low  . Obesity     Priority: Low  . Thrombocytopenia ([place]) 04/13/2019  . Hyperkalemia, diminished renal excretion 04/01/2019  . Diarrhea 05/11/2017    Medications- reviewed and updated Current Outpatient Medications  Medication Sig Dispense Refill  . aspirin EC 81 MG tablet Take 81 mg by mouth daily.    [name] atorvastatin (LIPITOR) 40 MG tablet Take 1 tablet by mouth once daily 90 tablet 0  . Carboxymethylcellul-Glycerin (LUBRICATING EYE DROPS OP) Place 1 drop into both eyes 3 (three) times daily as needed (dry eyes).    . carvedilol (COREG) 6.25 MG tablet Take 1 tablet (6.25 mg total) by mouth 2 (two) times daily with a meal. Hold on mornings you have dialysis. 180 tablet 3  . ciclopirox (PENLAC) 8 % solution     . erythromycin ophthalmic ointment Place 1 application into the left eye at bedtime.    [name] glucose blood (FREESTYLE TEST STRIPS) test strip Use to check blood sugar daily 100 each 3  . glucose monitoring kit (FREESTYLE) monitoring kit USE TO MONITOR BLOOD GLUCOSE AS DIRECTED 1 each 1  . lansoprazole (PREVACID) 15 MG capsule Take 1 capsule (15 mg total) by mouth daily at 12 noon. 90 capsule 3  . mirtazapine (REMERON)  7.5 MG tablet Take 0.5-1 tablets (3.75-7.5 mg total) by mouth at bedtime. 30 tablet 5  . nitroGLYCERIN (NITROSTAT) 0.4 MG SL tablet Place 1 tablet (0.4 mg total) under the tongue every 5 (five) minutes as needed for chest pain. 2 doses before calling for emergent help 25 tablet 3  . oxymetazoline (AFRIN) 0.05 % nasal spray Place 1 spray into both nostrils 2 (two) times daily as needed for congestion.    . saccharomyces boulardii (FLORASTOR) 250 MG capsule Take 1 capsule (250 mg total) by mouth 2 (two) times daily. 30 capsule 0  . sertraline  (ZOLOFT) 100 MG tablet TAKE 1 & 1/2 (ONE & ONE-HALF) TABLETS BY MOUTH ONCE DAILY (Patient taking differently: Take 150 mg by mouth daily. ) 135 tablet 1  . sevelamer carbonate (RENVELA) 800 MG tablet Take 1,600 mg by mouth 3 (three) times daily.    . SPS 15 GM/60ML suspension     . triamcinolone cream (KENALOG) 0.1 %     . vitamin B-12 (CYANOCOBALAMIN) 1000 MCG tablet Take 1,000 mcg by mouth daily.     No current facility-administered medications for this visit.     Objective:  BP 126/62   Ht '5\' 8"'$  (1.727 m)   Wt 203 lb 12.8 oz (92.4 kg)   BMI 30.99 kg/m  Gen: NAD, resting comfortably CV: RRR no murmurs rubs or gallops Lungs: CTAB no crackles, wheeze, rhonchi  Ext: trace edema Skin: warm, dry     Assessment and Plan   # Diabetes S: diet controlled now. Insulin needed in the past.  CBGs- brings me readings for last few monts. Vast majority of #s between 90-170. did have one reading of 58 in last few months- reports was after dialysis and they gave him canned soup and he did better Exercise and diet- trying to eat a healthy diet- states works hard at it.  Lab Results  Component Value Date   HGBA1C 5.7 (H) 04/02/2019  A/P: a1c looks great at 5.8 with report from dialysis- we will continue with diet control- we will abstract this in -done within a week.   #hypertension S: compliant with carvedilol 12.5 mg BID- skips AM dose if has dialysis due to low blood pressures. Patient has had issues with blood pressures dropping after treatment and will need someone to drive him home. Has good amount of variability at home - if gets blood pressure down into 90s vision decreases- states usually at dialysis and they monitor him. Does well on non dialysis days BP Readings from Last 3 Encounters:  08/01/19 128/62  08/01/19 126/62  06/22/19 (!) 104/52   A/P: Stable but getting some lows with dialysis. Continue current medications but from avs "Lets try skipping carvedilol night  before dialysis  in addition to morning of to see if that helps with blood pressure issues on  dialysis days.". Coreg 12.'5mg'$  BID non dialysis days.   # Depression S: reasonable control on sertraline '150mg'$  daily in the past- recently symptoms have worsened. No SI  Reports dire financial problems made worst by dialysis. Medication $46 a month makes it tough for him. Owes cone $3500. Has other extended payments. Medical situation confuses him. $4k in hearing aids that dont help. Wife Rod Holler leaving for [place] over 2 years ago was very costly and now he doesn't have 2 incomes anymore. Lost social security income due to some situation with his wife- he is getting reinstated with lower amount Depression screen [facility] 2/9 08/01/2019 05/30/2019 11/04/2018  Decreased Interest  _0 Down, Depressed, Hopeless 3 1 0  PHQ - 2 Score _1 Altered sleeping _2 Tired, decreased energy 3 0 3  Change in appetite 3 0 0  Feeling bad or failure about yourself  3 1 0  Trouble concentrating _3 Moving slowly or fidgety/restless 0 0 0  Suicidal thoughts 0 0 0  PHQ-9 Score _4 Difficult doing work/chores Very difficult Somewhat difficult Not difficult at all  Some recent data might be hidden   A/P: Poor control- continue sertraline. We will try to add counseling. [facility] referral to see if can help with finances which has been a stressor for him.   Recommended follow up: Return in about 14 weeks (around 11/07/2019). Future Appointments  Date Time Provider [place]  08/24/2019 11:15 AM Evelina Bucy, DPM [facility]  08/31/2019 11:20 AM [name], MD [facility]  09/19/2019  8:30 AM [facility] DEVICE REMOTES [facility]  12/19/2019  8:30 AM [facility] DEVICE REMOTES [facility]  03/19/2020  8:30 AM [facility] DEVICE REMOTES [facility]   Lab/Order associations:   ICD-10-CM   1. Controlled type 2 diabetes mellitus with chronic kidney disease on chronic  dialysis, without long-term current use of insulin (HCC)  E11.22    N18.6    Z99.2   2. Hypertension associated with diabetes ([place])  E11.59    I10   3. Depression, major, recurrent, moderate ([place])  F33.1    Return precautions advised.  Garret Reddish, MD

## 2019-08-01 NOTE — Assessment & Plan Note (Signed)
Stable but getting some lows with dialysis. Continue current medications but from avs "Lets try skipping carvedilol night  before dialysis in addition to morning of to see if that helps with blood pressure issues on  dialysis days.". Coreg 12.5mg  BID non dialysis days.

## 2019-08-01 NOTE — Patient Instructions (Addendum)
Lets try skipping carvedilol night before dialysis in addition to morning of to see if that helps with blood pressure issues on dialysis days.   Please call [phone] to schedule a visit with [facility] -Trey Paula is an excellent counselor who is based out of our clinic  Team also please place [facility] referral for medication assistance due to financial concerns  Recommended follow up: Return in about 14 weeks (around 11/07/2019).

## 2019-08-01 NOTE — Patient Instructions (Addendum)
Rodney Farley , Thank you for taking time to come for your Medicare Wellness Visit. I appreciate your ongoing commitment to your health goals. Please review the following plan we discussed and let me know if I can assist you in the future.   Screening recommendations/referrals: Colorectal Screening: No longer indicated   Vision and Dental Exams: Recommended annual ophthalmology exams for early detection of glaucoma and other disorders of the eye Recommended annual dental exams for proper oral hygiene  Diabetic Exams: Diabetic Eye Exam: recommended yearly; up to date Diabetic Foot Exam: recommended yearly; up to date   Vaccinations: Influenza vaccine: completed 04/04/19 Pneumococcal vaccine: up to date; last 04/27/16 Tdap vaccine: up to date; last 04/01/19  Shingles vaccine: Shingrix completed   Advanced directives: Please bring a copy of your POA (Power of [place]) and/or Living Will to your next appointment.  Goals: Recommend to drink mostly water and follow renal diet.  Incorporate some type of exercise into your daily routine and find some time to get outside daily.   Next appointment: Please schedule your Annual Wellness Visit with your Nurse Health Advisor in one year.  Preventive Care 84 Years and Older, Male Preventive care refers to lifestyle choices and visits with your health care provider that can promote health and wellness. What does preventive care include?  A yearly physical exam. This is also called an annual well check.  Dental exams once or twice a year.  Routine eye exams. Ask your health care provider how often you should have your eyes checked.  Personal lifestyle choices, including:  Daily care of your teeth and gums.  Regular physical activity.  Eating a healthy diet.  Avoiding tobacco and drug use.  Limiting alcohol use.  Practicing safe sex.  Taking low doses of aspirin every day if recommended by your health care provider..  Taking vitamin  and mineral supplements as recommended by your health care provider. What happens during an annual well check? The services and screenings done by your health care provider during your annual well check will depend on your age, overall health, lifestyle risk factors, and family history of disease. Counseling  Your health care provider may ask you questions about your:  Alcohol use.  Tobacco use.  Drug use.  Emotional well-being.  Home and relationship well-being.  Sexual activity.  Eating habits.  History of falls.  Memory and ability to understand (cognition).  Work and work Statistician. Screening  You may have the following tests or measurements:  Height, weight, and BMI.  Blood pressure.  Lipid and cholesterol levels. These may be checked every 5 years, or more frequently if you are over 24 years old.  Skin check.  Lung cancer screening. You may have this screening every year starting at age 53 if you have a 30-pack-year history of smoking and currently smoke or have quit within the past 15 years.  Fecal occult blood test (FOBT) of the stool. You may have this test every year starting at age 58.  Flexible sigmoidoscopy or colonoscopy. You may have a sigmoidoscopy every 5 years or a colonoscopy every 10 years starting at age 72.  Prostate cancer screening. Recommendations will vary depending on your family history and other risks.  Hepatitis C blood test.  Hepatitis B blood test.  Sexually transmitted disease (STD) testing.  Diabetes screening. This is done by checking your blood sugar (glucose) after you have not eaten for a while (fasting). You may have this done every 1-3 years.  Abdominal aortic  aneurysm (AAA) screening. You may need this if you are a current or former smoker.  Osteoporosis. You may be screened starting at age 34 if you are at high risk. Talk with your health care provider about your test results, treatment options, and if necessary, the  need for more tests. Vaccines  Your health care provider may recommend certain vaccines, such as:  Influenza vaccine. This is recommended every year.  Tetanus, diphtheria, and acellular pertussis (Tdap, Td) vaccine. You may need a Td booster every 10 years.  Zoster vaccine. You may need this after age 6.  Pneumococcal 13-valent conjugate (PCV13) vaccine. One dose is recommended after age 64.  Pneumococcal polysaccharide (PPSV23) vaccine. One dose is recommended after age 1. Talk to your health care provider about which screenings and vaccines you need and how often you need them. This information is not intended to replace advice given to you by your health care provider. Make sure you discuss any questions you have with your health care provider. Document Released: 07/26/2015 Document Revised: 03/18/2016 Document Reviewed: 04/30/2015 Elsevier Interactive Patient Education  2017 [place] Prevention in the Home Falls can cause injuries. They can happen to people of all ages. There are many things you can do to make your home safe and to help prevent falls. What can I do on the outside of my home?  Regularly fix the edges of walkways and driveways and fix any cracks.  Remove anything that might make you trip as you walk through a door, such as a raised step or threshold.  Trim any bushes or trees on the path to your home.  Use bright outdoor lighting.  Clear any walking paths of anything that might make someone trip, such as rocks or tools.  Regularly check to see if handrails are loose or broken. Make sure that both sides of any steps have handrails.  Any raised decks and porches should have guardrails on the edges.  Have any leaves, snow, or ice cleared regularly.  Use sand or salt on walking paths during winter.  Clean up any spills in your garage right away. This includes oil or grease spills. What can I do in the bathroom?  Use night lights.  Install grab  bars by the toilet and in the tub and shower. Do not use towel bars as grab bars.  Use non-skid mats or decals in the tub or shower.  If you need to sit down in the shower, use a plastic, non-slip stool.  Keep the floor dry. Clean up any water that spills on the floor as soon as it happens.  Remove soap buildup in the tub or shower regularly.  Attach bath mats securely with double-sided non-slip rug tape.  Do not have throw rugs and other things on the floor that can make you trip. What can I do in the bedroom?  Use night lights.  Make sure that you have a light by your bed that is easy to reach.  Do not use any sheets or blankets that are too big for your bed. They should not hang down onto the floor.  Have a firm chair that has side arms. You can use this for support while you get dressed.  Do not have throw rugs and other things on the floor that can make you trip. What can I do in the kitchen?  Clean up any spills right away.  Avoid walking on wet floors.  Keep items that you use a lot  in easy-to-reach places.  If you need to reach something above you, use a strong step stool that has a grab bar.  Keep electrical cords out of the way.  Do not use floor polish or wax that makes floors slippery. If you must use wax, use non-skid floor wax.  Do not have throw rugs and other things on the floor that can make you trip. What can I do with my stairs?  Do not leave any items on the stairs.  Make sure that there are handrails on both sides of the stairs and use them. Fix handrails that are broken or loose. Make sure that handrails are as long as the stairways.  Check any carpeting to make sure that it is firmly attached to the stairs. Fix any carpet that is loose or worn.  Avoid having throw rugs at the top or bottom of the stairs. If you do have throw rugs, attach them to the floor with carpet tape.  Make sure that you have a light switch at the top of the stairs and the  bottom of the stairs. If you do not have them, ask someone to add them for you. What else can I do to help prevent falls?  Wear shoes that:  Do not have high heels.  Have rubber bottoms.  Are comfortable and fit you well.  Are closed at the toe. Do not wear sandals.  If you use a stepladder:  Make sure that it is fully opened. Do not climb a closed stepladder.  Make sure that both sides of the stepladder are locked into place.  Ask someone to hold it for you, if possible.  Clearly mark and make sure that you can see:  Any grab bars or handrails.  First and last steps.  Where the edge of each step is.  Use tools that help you move around (mobility aids) if they are needed. These include:  Canes.  Walkers.  Scooters.  Crutches.  Turn on the lights when you go into a dark area. Replace any light bulbs as soon as they burn out.  Set up your furniture so you have a clear path. Avoid moving your furniture around.  If any of your floors are uneven, fix them.  If there are any pets around you, be aware of where they are.  Review your medicines with your doctor. Some medicines can make you feel dizzy. This can increase your chance of falling. Ask your doctor what other things that you can do to help prevent falls. This information is not intended to replace advice given to you by your health care provider. Make sure you discuss any questions you have with your health care provider. Document Released: 04/25/2009 Document Revised: 12/05/2015 Document Reviewed: 08/03/2014 Elsevier Interactive Patient Education  2017 [employer].

## 2019-08-01 NOTE — Assessment & Plan Note (Signed)
Poor control- continue sertraline. We will try to add counseling. [facility] referral to see if can help with finances which has been a stressor for him.

## 2019-08-01 NOTE — Progress Notes (Signed)
Subjective:   Rodney Farley is a 86 y.o. male who presents for Medicare Annual/Subsequent preventive examination.  Review of Systems:   Cardiac Risk Factors include: advanced age (>32mn, >>61women);male gender;hypertension;diabetes mellitus;dyslipidemia    Objective:    Vitals: BP 128/62   Ht '5\' 8"'  (1.727 m)   Wt 203 lb 11.3 oz (92.4 kg)   BMI 30.97 kg/m   Body mass index is 30.97 kg/m.  Advanced Directives 08/01/2019 04/02/2019 03/24/2019 12/02/2018 10/14/2017 10/07/2017 03/30/2017  Does Patient Have a Medical Advance Directive? Yes No No Yes Yes Yes No  Type of Advance Directive [place] will [place] will [place] will -  Does patient want to make changes to medical advance directive? No - Patient declined - - - No - Patient declined - -  Copy of [place] Chart? No - copy requested - - - Yes Yes -  Would patient like information on creating a medical advance directive? - No - Patient declined No - Patient declined - - - No - Patient declined    Tobacco Social History   Tobacco Use  Smoking Status Former Smoker  . Packs/day: 1.50  . Years: 54.00  . Pack years: 81.00  . Types: Cigarettes  . Quit date: 07/18/2007  . Years since quitting: 12.0  Smokeless Tobacco Never Used  Tobacco Comment   did not discuss LdCT but being evaluated for chole currently     Counseling given: Not Answered Comment: did not discuss LdCT but being evaluated for chole currently   Clinical Intake:  Pre-visit preparation completed: Yes  Pain : No/denies pain  Diabetes: Yes CBG done?: No Did pt. bring in CBG monitor from home?: No  How often do you need to have someone help you when you read instructions, pamphlets, or other written materials from your doctor or pharmacy?: 3 - Sometimes  Interpreter Needed?: No  Information entered by :: CDenman GeorgeLPN  Past Medical History:  Diagnosis Date  . AICD (automatic cardioverter/defibrillator) present   . AKI (acute kidney injury) ([place] 03/27/2017  . Anemia   . Anginal pain ([facility] [facility]   . Anxiety   . Arm pain 05/08/2015   LEFT ARM  . Arthritis   . Atrioventricular block, complete ([place]    a. 2010 s/p pacemaker.  . Bell's palsy    left side. after shingles episode  . Bilateral renal cysts 07/23/2017   Simple and hemorrhagic noted on CT ab/pelvis   . BPH associated with nocturia   . Cardiomegaly   . Chronic systolic CHF (congestive heart failure) ([facility]    EF normalized by Echo 2019  . CKD (chronic kidney disease), stage IV ([facility]   . COPD (chronic obstructive pulmonary disease) (HCC)    Severe  . Coronary artery disease    a. [place]. 03/2015: progression of disease, for staged PCI.  .Marland KitchenDepression   . Diabetic peripheral neuropathy ([facility]   . Diarrhea   . Diverticulitis   . DOE (dyspnea on exertion) 03/28/2015  . Full dentures   . Gallstones   . GERD (gastroesophageal reflux disease)   . Gout   . Hard of hearing    B/L  . Heart murmur   . History of chronic pancreatitis 07/23/2017   noted on CT abd/pelvis  . History of shingles   . Hypercholesterolemia   .  Hypertension   . Ischemic cardiomyopathy   . MI (myocardial infarction) ([place]) 1994; 1995  . Neuropathy    IN LOWER EXTREMITIES  . Nosebleed 10/06/2017   for 2 months most recent 10/06/2017  . Obesity   . Pacemaker    medtronic>>> MDT ICD 09/23/15  . Ruptured appendicitis   . Sleep apnea    "sleeps w/humidifyer when he panics and gets short of breath" (04/08/2015)  . TIA (transient ischemic attack) X 3  . Trigger middle finger of left hand   . Type II diabetes mellitus ([place])   . Wears glasses   . Wears hearing aid    Past Surgical History:  Procedure Laterality Date  . APPENDECTOMY    . AV FISTULA PLACEMENT Right 02/07/2019   Procedure: ARTERIOVENOUS (AV)  FISTULA CREATION RIGHT UPPER ARM;  Surgeon: Waynetta Sandy, MD;  Location: [facility];  Service: Vascular;  Laterality: Right;  . CARDIAC CATHETERIZATION N/A 03/29/2015   Procedure: Right/Left Heart Cath and Coronary Angiography;  Surgeon: Abbie M [name], MD;  Location: [facility];  Service: Cardiovascular;  Laterality: N/A;  . [place]   "after my MI; put me on heart RX after cath"  . CARDIAC CATHETERIZATION N/A 04/09/2015   Procedure: Coronary Stent Intervention;  Surgeon: Jorje M [name], MD;  Location: [facility];  Service: Cardiovascular;  Laterality: N/A;  . CATARACT EXTRACTION W/ INTRAOCULAR LENS  IMPLANT, BILATERAL    . CHOLECYSTECTOMY N/A 10/13/2017   Procedure: LAPAROSCOPIC CHOLECYSTECTOMY WITH LYSIS OF ADHESIONS;  Surgeon: Ileana Roup, MD;  Location[facility];  Service: General;  Laterality: N/A;  . COLONOSCOPY    . DENTAL SURGERY    . EP IMPLANTABLE DEVICE N/A 09/23/2015   MDT CRT-D, Dr. Caryl Comes  . HIATAL HERNIA REPAIR  1977  . ILEOCECETOMY N/A 03/27/2017   Procedure: ILEOCECECTOMY;  Surgeon: Ileana Roup, MD;  Location: [facility];  Service: General;  Laterality: N/A;  . INSERT / REPLACE / REMOVE PACEMAKER  07/2008   Complete heart block status post DDD with good function  . IR FLUORO GUIDE CV LINE RIGHT  04/03/2019  . IR US GUIDE VASC ACCESS RIGHT  04/03/2019  . LAPAROTOMY N/A 03/27/2017   Procedure: EXPLORATORY LAPAROTOMY;  Surgeon: Ileana Roup, MD;  Location: [facility];  Service: General;  Laterality: N/A;  . TONSILLECTOMY    . UPPER GI ENDOSCOPY     Family History  Problem Relation Age of Onset  . Stroke Mother   . Leukemia Father   . Stroke Sister   . Heart attack Brother    Social History   Socioeconomic History  . Marital status: Legally Separated    Spouse name: Not on file  . Number of children: 1  . Years of education: college  . Highest education level: Not on file  Occupational History  . Occupation:  Retired  Tobacco Use  . Smoking status: Former Smoker    Packs/day: 1.50    Years: 54.00    Pack years: 81.00    Types: Cigarettes    Quit date: 07/18/2007    Years since quitting: 12.0  . Smokeless tobacco: Never Used  . Tobacco comment: did not discuss LdCT but being evaluated for chole currently  Substance and Sexual Activity  . Alcohol use: Yes    Comment: rare  . Drug use: No  . Sexual activity: Never  Other Topics Concern  . Not on file  Social History Narrative   Married  1994 (together since 1989) 1 son, 1 stepson. 3 grandkids, 6 great grandkids      Retired from [employer]. Had 2 years of collge.       Faith: Mormon      Here on [address] since 2004 from [place]; wife recently left and relocated to [facility]    Social Determinants of [employer] Strain:   . Difficulty of Paying Living Expenses: Not on file  Food Insecurity:   . Worried About Charity fundraiser in the Last Year: Not on file  . Ran Out of Food in the Last Year: Not on file  Transportation Needs:   . Lack of Transportation (Medical): Not on file  . Lack of Transportation (Non-Medical): Not on file  Physical Activity:   . Days of Exercise per Week: Not on file  . Minutes of Exercise per Session: Not on file  Stress:   . Feeling of Stress : Not on file  Social Connections:   . Frequency of Communication with Friends and Family: Not on file  . Frequency of Social Gatherings with Friends and Family: Not on file  . Attends Religious Services: Not on file  . Active Member of Clubs or Organizations: Not on file  . Attends Archivist Meetings: Not on file  . Marital Status: Not on file    Outpatient Encounter Medications as of 08/01/2019  Medication Sig  . aspirin EC 81 MG tablet Take 81 mg by mouth daily.  [name] atorvastatin (LIPITOR) 40 MG tablet Take 1 tablet by mouth once daily  . Carboxymethylcellul-Glycerin (LUBRICATING EYE DROPS OP) Place 1 drop into both eyes 3  (three) times daily as needed (dry eyes).  . carvedilol (COREG) 6.25 MG tablet Take 1 tablet (6.25 mg total) by mouth 2 (two) times daily with a meal. Hold on mornings you have dialysis.  . ciclopirox (PENLAC) 8 % solution   . erythromycin ophthalmic ointment Place 1 application into the left eye at bedtime.  [name] glucose blood (FREESTYLE TEST STRIPS) test strip Use to check blood sugar daily  . glucose monitoring kit (FREESTYLE) monitoring kit USE TO MONITOR BLOOD GLUCOSE AS DIRECTED  . lansoprazole (PREVACID) 15 MG capsule Take 1 capsule (15 mg total) by mouth daily at 12 noon.  . mirtazapine (REMERON) 7.5 MG tablet Take 0.5-1 tablets (3.75-7.5 mg total) by mouth at bedtime.  . nitroGLYCERIN (NITROSTAT) 0.4 MG SL tablet Place 1 tablet (0.4 mg total) under the tongue every 5 (five) minutes as needed for chest pain. 2 doses before calling for emergent help  . oxymetazoline (AFRIN) 0.05 % nasal spray Place 1 spray into both nostrils 2 (two) times daily as needed for congestion.  . saccharomyces boulardii (FLORASTOR) 250 MG capsule Take 1 capsule (250 mg total) by mouth 2 (two) times daily.  . sertraline (ZOLOFT) 100 MG tablet TAKE 1 & 1/2 (ONE & ONE-HALF) TABLETS BY MOUTH ONCE DAILY (Patient taking differently: Take 150 mg by mouth daily. )  . sevelamer carbonate (RENVELA) 800 MG tablet Take 1,600 mg by mouth 3 (three) times daily.  . SPS 15 GM/60ML suspension   . triamcinolone cream (KENALOG) 0.1 %   . vitamin B-12 (CYANOCOBALAMIN) 1000 MCG tablet Take 1,000 mcg by mouth daily.   No facility-administered encounter medications on file as of 08/01/2019.    Activities of Daily Living In your present state of health, do you have any difficulty performing the following activities: 08/01/2019 04/02/2019  Hearing? Aggie Moats  Vision? N N  Difficulty concentrating or making decisions? N N  Walking or climbing stairs? N N  Dressing or bathing? N N  Doing errands, shopping? N N  Comment at times related to  hearing -  Preparing Food and eating ? N -  Using the Toilet? N -  In the past six months, have you accidently leaked urine? N -  Do you have problems with loss of bowel control? N -  Managing your Medications? N -  Managing your Finances? Y -  Comment due to problems with seperation -  Housekeeping or managing your Housekeeping? N -  Some recent data might be hidden    Patient Care Team: [name], MD as PCP - General (Family Medicine) [name], Juandiego M, MD as PCP - Cardiology (Cardiology) Evelina Bucy, DPM as Consulting Physician (Podiatry) Katy Fitch, Darlina Guys, MD as Consulting Physician (Ophthalmology) Jamal Maes, MD as Consulting Physician (Nephrology) Deboraha Sprang, MD as Consulting Physician (Cardiology)   Assessment:   This is a routine wellness examination for Rodney Farley.  Exercise Activities and Dietary recommendations Current Exercise Habits: The patient does not participate in regular exercise at present  Goals    . Exercise 150 min/wk Moderate Activity     Will join the silver sneaker program  Does balance exercises at home     . Patient Stated     -Improve hearing aids  -Improve emotional wellness  -Sort out financial affairs        Fall Risk Fall Risk  08/01/2019 05/30/2019 04/13/2019 12/08/2017 08/12/2017  Falls in the past year? 0 0 1 No Yes  Number falls in past yr: 0 0 1 - 2 or more  Injury with Fall? 0 0 0 - No  Risk for fall due to : - - - - -  Follow up Falls evaluation completed;Education provided;Falls prevention discussed - - - Education provided  Comment - - - - he has had PT due to surgical injury   Is the patient's home free of loose throw rugs in walkways, pet beds, electrical cords, etc?   yes      Grab bars in the bathroom? yes      Handrails on the stairs?   yes      Adequate lighting?   yes  Timed Get Up and Go Performed: completed and within normal timeframe; no gait abnormalities noted   Depression Screen PHQ 2/9  Scores 08/01/2019 05/30/2019 11/04/2018 08/03/2018  PHQ - 2 Score '6 2 2 3  ' PHQ- 9 Score '21 5 11 17    ' Cognitive Function- no cognitive concerns at this time  Cognitive Testing  Alert? Yes         Normal Appearance? Yes  Oriented to person? Yes           Place? Yes  Time? Yes  Recall of three objects? Yes  Can perform simple calculations? Yes  Displays appropriate judgment? Yes  Can read the correct time from a watch face? Yes   MMSE - Mini Mental State Exam 08/12/2017  Not completed: (No Data)        Immunization History  Administered Date(s) Administered  . Fluad Quad(high Dose 65+) 04/04/2019  . Hepatitis B, adult 04/26/2019, 05/29/2019, 07/10/2019  . Influenza, High Dose Seasonal PF 05/11/2017  . Influenza,inj,Quad PF,6+ Mos 08/27/2013, 09/19/2014, 04/10/2015, 03/26/2016  . Influenza-Unspecified 03/11/2018  . Pneumococcal Conjugate-13 09/19/2014  . Pneumococcal Polysaccharide-23 04/27/2016  . Tdap 09/19/2014, 04/01/2019  . Zoster Recombinat (Shingrix) 06/05/2018,  09/20/2018    Qualifies for Shingles Vaccine? Shingrix completed   Screening Tests Health Maintenance  Topic Date Due  . HEMOGLOBIN A1C  09/30/2019  . OPHTHALMOLOGY EXAM  01/17/2020  . FOOT EXAM  04/12/2020  . TETANUS/TDAP  03/31/2029  . INFLUENZA VACCINE  Completed  . PNA vac Low Risk Adult  Completed   Cancer Screenings: Lung: Low Dose CT Chest recommended if Age 53-80 years, 30 pack-year currently smoking OR have quit w/in 15years. Patient does not qualify. Colorectal: No longer indicated    Plan:  I have personally reviewed and addressed the Medicare Annual Wellness questionnaire and have noted the following in the patient's chart:  A. Medical and social history B. Use of alcohol, tobacco or illicit drugs  C. Current medications and supplements D. Functional ability and status E.  Nutritional status F.  Physical activity G. Advance directives H. List of other physicians I.  Hospitalizations,  surgeries, and ER visits in previous 12 months J.  [place] such as hearing and vision if needed, cognitive and depression L. Referrals, records requested, and appointments- [facility]   In addition, I have reviewed and discussed with patient certain preventive protocols, quality metrics, and best practice recommendations. A written personalized care plan for preventive services as well as general preventive health recommendations were provided to patient.   Signed,  Denman George, LPN  Nurse Health Advisor   Nurse Notes: Patient with multiple social concerns related to finances and problems with wife relocating.  Referral to care management placed for assistance.  Will research other options to help patient better manage health.

## 2019-08-13 DIAGNOSIS — Z992 Dependence on renal dialysis: Secondary | ICD-10-CM | POA: Diagnosis not present

## 2019-08-13 DIAGNOSIS — E1129 Type 2 diabetes mellitus with other diabetic kidney complication: Secondary | ICD-10-CM | POA: Diagnosis not present

## 2019-08-13 DIAGNOSIS — N186 End stage renal disease: Secondary | ICD-10-CM | POA: Diagnosis not present

## 2019-08-14 DIAGNOSIS — N186 End stage renal disease: Secondary | ICD-10-CM | POA: Diagnosis not present

## 2019-08-14 DIAGNOSIS — D689 Coagulation defect, unspecified: Secondary | ICD-10-CM | POA: Diagnosis not present

## 2019-08-14 DIAGNOSIS — E876 Hypokalemia: Secondary | ICD-10-CM | POA: Diagnosis not present

## 2019-08-14 DIAGNOSIS — N2581 Secondary hyperparathyroidism of renal origin: Secondary | ICD-10-CM | POA: Diagnosis not present

## 2019-08-14 DIAGNOSIS — D509 Iron deficiency anemia, unspecified: Secondary | ICD-10-CM | POA: Diagnosis not present

## 2019-08-14 DIAGNOSIS — Z992 Dependence on renal dialysis: Secondary | ICD-10-CM | POA: Diagnosis not present

## 2019-08-14 DIAGNOSIS — D631 Anemia in chronic kidney disease: Secondary | ICD-10-CM | POA: Diagnosis not present

## 2019-08-22 NOTE — Telephone Encounter (Signed)
Error

## 2019-08-24 ENCOUNTER — Other Ambulatory Visit: Payer: Self-pay

## 2019-08-24 ENCOUNTER — Ambulatory Visit (HOSPITAL_COMMUNITY)
Admission: RE | Admit: 2019-08-24 | Discharge: 2019-08-24 | Disposition: A | Discharge status: Home / Self Care | Payer: PPO | Source: Ambulatory Visit | Attending: Vascular Surgery | Admitting: Vascular Surgery

## 2019-08-24 ENCOUNTER — Ambulatory Visit (INDEPENDENT_AMBULATORY_CARE_PROVIDER_SITE_OTHER): Payer: PPO | Admitting: Physician Assistant

## 2019-08-24 ENCOUNTER — Ambulatory Visit: Payer: PPO | Admitting: Podiatry

## 2019-08-24 VITALS — BP 135/60 | HR 70 | Temp 98.5°F | Resp 20 | Ht 68.0 in | Wt 202.0 lb

## 2019-08-24 DIAGNOSIS — Z992 Dependence on renal dialysis: Secondary | ICD-10-CM

## 2019-08-24 DIAGNOSIS — B351 Tinea unguium: Secondary | ICD-10-CM | POA: Diagnosis not present

## 2019-08-24 DIAGNOSIS — N186 End stage renal disease: Secondary | ICD-10-CM | POA: Diagnosis not present

## 2019-08-24 DIAGNOSIS — E1122 Type 2 diabetes mellitus with diabetic chronic kidney disease: Secondary | ICD-10-CM

## 2019-08-24 DIAGNOSIS — N184 Chronic kidney disease, stage 4 (severe): Secondary | ICD-10-CM | POA: Diagnosis not present

## 2019-08-24 NOTE — Progress Notes (Signed)
Established Dialysis Access   History of Present Illness   Rodney Farley is a 86 y.o. ([birth year]) male who presents for re-evaluation of permanent access.  Right brachiocephalic fistula was created in July 2020 by Dr. Donzetta Matters.  [facility] has been used for hemodialysis on a Monday Wednesday Friday schedule since October.  The patient is complaining of pain and numbness in his right hand mainly when connected to the hemodialysis machine.  He states that his symptoms resolve when he is disconnected.  He denies any nonhealing wounds on his right hand.  He says symptoms are tolerable however is here today to see if there is a problem with his fistula or circulation to his right hand.    [name] PMH, PSH, SH, and FamHx were reviewed and are unchanged from prior visit.  Current Outpatient Medications  Medication Sig Dispense Refill  . aspirin EC 81 MG tablet Take 81 mg by mouth daily.    [name] atorvastatin (LIPITOR) 40 MG tablet Take 1 tablet by mouth once daily 90 tablet 0  . Carboxymethylcellul-Glycerin (LUBRICATING EYE DROPS OP) Place 1 drop into both eyes 3 (three) times daily as needed (dry eyes).    . carvedilol (COREG) 6.25 MG tablet Take 1 tablet (6.25 mg total) by mouth 2 (two) times daily with a meal. Hold on mornings you have dialysis. 180 tablet 3  . ciclopirox (PENLAC) 8 % solution     . erythromycin ophthalmic ointment Place 1 application into the left eye at bedtime.    [name] glucose blood (FREESTYLE TEST STRIPS) test strip Use to check blood sugar daily 100 each 3  . glucose monitoring kit (FREESTYLE) monitoring kit USE TO MONITOR BLOOD GLUCOSE AS DIRECTED 1 each 1  . lansoprazole (PREVACID) 15 MG capsule Take 1 capsule (15 mg total) by mouth daily at 12 noon. 90 capsule 3  . nitroGLYCERIN (NITROSTAT) 0.4 MG SL tablet Place 1 tablet (0.4 mg total) under the tongue every 5 (five) minutes as needed for chest pain. 2 doses before calling for emergent help 25 tablet 3  .  oxymetazoline (AFRIN) 0.05 % nasal spray Place 1 spray into both nostrils 2 (two) times daily as needed for congestion.    . saccharomyces boulardii (FLORASTOR) 250 MG capsule Take 1 capsule (250 mg total) by mouth 2 (two) times daily. 30 capsule 0  . sertraline (ZOLOFT) 100 MG tablet TAKE 1 & 1/2 (ONE & ONE-HALF) TABLETS BY MOUTH ONCE DAILY (Patient taking differently: Take 150 mg by mouth daily. ) 135 tablet 1  . sevelamer carbonate (RENVELA) 800 MG tablet Take 1,600 mg by mouth 3 (three) times daily.    . SPS 15 GM/60ML suspension     . triamcinolone cream (KENALOG) 0.1 %     . vitamin B-12 (CYANOCOBALAMIN) 1000 MCG tablet Take 1,000 mcg by mouth daily.    . mirtazapine (REMERON) 7.5 MG tablet Take 0.5-1 tablets (3.75-7.5 mg total) by mouth at bedtime. (Patient not taking: Reported on 08/24/2019) 30 tablet 5   No current facility-administered medications for this visit.    On ROS today: Neuropathy in feet; dizziness when going from sitting to standing   Physical Examination   Vitals:   08/24/19 1042  BP: 135/60  Pulse: 70  Resp: 20  Temp: 98.5 F (36.9 C)  SpO2: 97%  Weight: 202 lb (91.6 kg)  Height: '5\' 8"'  (1.727 m)   Body mass index is 30.71 kg/m.  General Alert, O x 3, WD, NAD  Pulmonary  Sym exp, good B air movt,   Cardiac RRR, Nl S1, S2,   Vascular Vessel Right Left  Radial Palpable Palpable  Brachial Palpable Palpable  Ulnar Not palpable Not palpable    Musculo- skeletal  1+ palpable right radial pulse; palpable thrill throughout right arm fistula  Neurologic A&O; CN grossly intact     Non-invasive Vascular Imaging   right Arm Access Duplex:   Patent right arm fistula without any areas of hemodynamically significant stenosis    Medical Decision Making   Rodney Farley is a 86 y.o. male who presents with ESRD requiring hemodialysis.    Patent right brachiocephalic fistula with palpable thrill  Right hand well perfused with 1+ palpable radial  pulse  Explained to patient it is common to have numbness, weakness, and even some pain in right hand when connected to the hemodialysis machine due to the flow dynamics.  I assured him that he has adequate flow to his right hand.  Patient states symptoms are tolerable.  He may follow-up on an as-needed basis   Dagoberto Ligas PA-C [facility] Office: [phone]  Clinic MD: Dr. Scot Dock

## 2019-08-31 ENCOUNTER — Ambulatory Visit: Payer: PPO | Admitting: Family Medicine

## 2019-09-08 ENCOUNTER — Telehealth: Payer: Self-pay

## 2019-09-08 NOTE — Telephone Encounter (Signed)
VM left to check in and to offer to schedule follow up visit with Palliative care

## 2019-09-10 DIAGNOSIS — N186 End stage renal disease: Secondary | ICD-10-CM | POA: Diagnosis not present

## 2019-09-10 DIAGNOSIS — E1129 Type 2 diabetes mellitus with other diabetic kidney complication: Secondary | ICD-10-CM | POA: Diagnosis not present

## 2019-09-10 DIAGNOSIS — Z992 Dependence on renal dialysis: Secondary | ICD-10-CM | POA: Diagnosis not present

## 2019-09-11 DIAGNOSIS — N186 End stage renal disease: Secondary | ICD-10-CM | POA: Diagnosis not present

## 2019-09-11 DIAGNOSIS — D631 Anemia in chronic kidney disease: Secondary | ICD-10-CM | POA: Diagnosis not present

## 2019-09-11 DIAGNOSIS — E876 Hypokalemia: Secondary | ICD-10-CM | POA: Diagnosis not present

## 2019-09-11 DIAGNOSIS — N2581 Secondary hyperparathyroidism of renal origin: Secondary | ICD-10-CM | POA: Diagnosis not present

## 2019-09-11 DIAGNOSIS — Z992 Dependence on renal dialysis: Secondary | ICD-10-CM | POA: Diagnosis not present

## 2019-09-11 DIAGNOSIS — D509 Iron deficiency anemia, unspecified: Secondary | ICD-10-CM | POA: Diagnosis not present

## 2019-09-11 DIAGNOSIS — D689 Coagulation defect, unspecified: Secondary | ICD-10-CM | POA: Diagnosis not present

## 2019-09-19 ENCOUNTER — Ambulatory Visit (INDEPENDENT_AMBULATORY_CARE_PROVIDER_SITE_OTHER): Payer: PPO | Admitting: *Deleted

## 2019-09-19 DIAGNOSIS — I5022 Chronic systolic (congestive) heart failure: Secondary | ICD-10-CM | POA: Diagnosis not present

## 2019-09-19 LAB — CUP PACEART REMOTE DEVICE CHECK
Battery Remaining Longevity: 18 mo
Battery Voltage: 2.91 V
Brady Statistic AP VP Percent: 82.59 %
Brady Statistic AP VS Percent: 0.24 %
Brady Statistic AS VP Percent: 17.02 %
Brady Statistic AS VS Percent: 0.15 %
Brady Statistic RA Percent Paced: 81.82 %
Brady Statistic RV Percent Paced: 99.28 %
Date Time Interrogation Session: 20210309001602
HighPow Impedance: 71 Ohm
Implantable Lead Implant Date: 20100112
Implantable Lead Implant Date: 20170313
Implantable Lead Implant Date: 20170313
Implantable Lead Location: 753858
Implantable Lead Location: 753859
Implantable Lead Location: 753860
Implantable Lead Model: 4598
Implantable Lead Model: 5076
Implantable Pulse Generator Implant Date: 20170313
Lead Channel Impedance Value: 399 Ohm
Lead Channel Impedance Value: 399 Ohm
Lead Channel Impedance Value: 418 Ohm
Lead Channel Impedance Value: 418 Ohm
Lead Channel Impedance Value: 456 Ohm
Lead Channel Impedance Value: 456 Ohm
Lead Channel Impedance Value: 475 Ohm
Lead Channel Impedance Value: 551 Ohm
Lead Channel Impedance Value: 703 Ohm
Lead Channel Impedance Value: 722 Ohm
Lead Channel Impedance Value: 722 Ohm
Lead Channel Impedance Value: 722 Ohm
Lead Channel Impedance Value: 722 Ohm
Lead Channel Pacing Threshold Amplitude: 0.5 V
Lead Channel Pacing Threshold Amplitude: 1.25 V
Lead Channel Pacing Threshold Amplitude: 1.375 V
Lead Channel Pacing Threshold Pulse Width: 0.4 ms
Lead Channel Pacing Threshold Pulse Width: 0.4 ms
Lead Channel Pacing Threshold Pulse Width: 0.4 ms
Lead Channel Sensing Intrinsic Amplitude: 0.875 mV
Lead Channel Sensing Intrinsic Amplitude: 0.875 mV
Lead Channel Sensing Intrinsic Amplitude: 12.375 mV
Lead Channel Sensing Intrinsic Amplitude: 12.375 mV
Lead Channel Setting Pacing Amplitude: 2.5 V
Lead Channel Setting Pacing Amplitude: 2.5 V
Lead Channel Setting Pacing Amplitude: 3 V
Lead Channel Setting Pacing Pulse Width: 0.4 ms
Lead Channel Setting Pacing Pulse Width: 0.4 ms
Lead Channel Setting Sensing Sensitivity: 0.3 mV

## 2019-09-20 NOTE — Progress Notes (Signed)
ICD Remote  

## 2019-09-21 ENCOUNTER — Other Ambulatory Visit: Payer: Self-pay | Admitting: Family Medicine

## 2019-09-21 DIAGNOSIS — F329 Major depressive disorder, single episode, unspecified: Secondary | ICD-10-CM

## 2019-09-21 DIAGNOSIS — F32A Depression, unspecified: Secondary | ICD-10-CM

## 2019-09-21 NOTE — Telephone Encounter (Signed)
LAST APPOINTMENT DATE: 08/01/2019 NEXT APPOINTMENT DATE:Visit date not found  Rx Zoloft 100mg  LAST REFILL: 03/06/2019 QTY:#135 1Rf

## 2019-10-07 ENCOUNTER — Other Ambulatory Visit: Payer: Self-pay | Admitting: Family Medicine

## 2019-10-10 ENCOUNTER — Other Ambulatory Visit: Payer: Self-pay | Admitting: Family Medicine

## 2019-10-11 DIAGNOSIS — E1129 Type 2 diabetes mellitus with other diabetic kidney complication: Secondary | ICD-10-CM | POA: Diagnosis not present

## 2019-10-11 DIAGNOSIS — Z992 Dependence on renal dialysis: Secondary | ICD-10-CM | POA: Diagnosis not present

## 2019-10-11 DIAGNOSIS — N186 End stage renal disease: Secondary | ICD-10-CM | POA: Diagnosis not present

## 2019-10-13 DIAGNOSIS — Z23 Encounter for immunization: Secondary | ICD-10-CM | POA: Diagnosis not present

## 2019-10-13 DIAGNOSIS — E876 Hypokalemia: Secondary | ICD-10-CM | POA: Diagnosis not present

## 2019-10-13 DIAGNOSIS — D689 Coagulation defect, unspecified: Secondary | ICD-10-CM | POA: Diagnosis not present

## 2019-10-13 DIAGNOSIS — Z992 Dependence on renal dialysis: Secondary | ICD-10-CM | POA: Diagnosis not present

## 2019-10-13 DIAGNOSIS — N186 End stage renal disease: Secondary | ICD-10-CM | POA: Diagnosis not present

## 2019-10-13 DIAGNOSIS — E1129 Type 2 diabetes mellitus with other diabetic kidney complication: Secondary | ICD-10-CM | POA: Diagnosis not present

## 2019-10-13 DIAGNOSIS — N2581 Secondary hyperparathyroidism of renal origin: Secondary | ICD-10-CM | POA: Diagnosis not present

## 2019-10-13 DIAGNOSIS — D509 Iron deficiency anemia, unspecified: Secondary | ICD-10-CM | POA: Diagnosis not present

## 2019-10-26 ENCOUNTER — Encounter: Payer: Self-pay | Admitting: Family Medicine

## 2019-10-27 LAB — HEMOGLOBIN A1C: Hemoglobin A1C: 6.7

## 2019-10-27 LAB — COMPREHENSIVE METABOLIC PANEL
Albumin: 4.3 (ref 3.5–5.0)
Calcium: 9.7 (ref 8.7–10.7)

## 2019-10-27 LAB — BASIC METABOLIC PANEL: Potassium: 4.5 (ref 3.4–5.3)

## 2019-10-27 LAB — CBC AND DIFFERENTIAL: Hemoglobin: 11.6 — AB (ref 13.5–17.5)

## 2019-10-31 DIAGNOSIS — Z961 Presence of intraocular lens: Secondary | ICD-10-CM | POA: Diagnosis not present

## 2019-10-31 DIAGNOSIS — Z794 Long term (current) use of insulin: Secondary | ICD-10-CM | POA: Diagnosis not present

## 2019-10-31 DIAGNOSIS — H00024 Hordeolum internum left upper eyelid: Secondary | ICD-10-CM | POA: Diagnosis not present

## 2019-10-31 DIAGNOSIS — H04123 Dry eye syndrome of bilateral lacrimal glands: Secondary | ICD-10-CM | POA: Diagnosis not present

## 2019-10-31 DIAGNOSIS — H16212 Exposure keratoconjunctivitis, left eye: Secondary | ICD-10-CM | POA: Diagnosis not present

## 2019-10-31 DIAGNOSIS — H35372 Puckering of macula, left eye: Secondary | ICD-10-CM | POA: Diagnosis not present

## 2019-10-31 DIAGNOSIS — E113393 Type 2 diabetes mellitus with moderate nonproliferative diabetic retinopathy without macular edema, bilateral: Secondary | ICD-10-CM | POA: Diagnosis not present

## 2019-10-31 LAB — HM DIABETES EYE EXAM

## 2019-11-07 ENCOUNTER — Ambulatory Visit: Payer: PPO | Admitting: Family Medicine

## 2019-11-07 NOTE — Telephone Encounter (Signed)
LVM to let the patient know I moved his appt.

## 2019-11-10 DIAGNOSIS — Z992 Dependence on renal dialysis: Secondary | ICD-10-CM | POA: Diagnosis not present

## 2019-11-10 DIAGNOSIS — N186 End stage renal disease: Secondary | ICD-10-CM | POA: Diagnosis not present

## 2019-11-10 DIAGNOSIS — E1129 Type 2 diabetes mellitus with other diabetic kidney complication: Secondary | ICD-10-CM | POA: Diagnosis not present

## 2019-11-13 DIAGNOSIS — N186 End stage renal disease: Secondary | ICD-10-CM | POA: Diagnosis not present

## 2019-11-13 DIAGNOSIS — N2581 Secondary hyperparathyroidism of renal origin: Secondary | ICD-10-CM | POA: Diagnosis not present

## 2019-11-13 DIAGNOSIS — D509 Iron deficiency anemia, unspecified: Secondary | ICD-10-CM | POA: Diagnosis not present

## 2019-11-13 DIAGNOSIS — D631 Anemia in chronic kidney disease: Secondary | ICD-10-CM | POA: Diagnosis not present

## 2019-11-13 DIAGNOSIS — Z992 Dependence on renal dialysis: Secondary | ICD-10-CM | POA: Diagnosis not present

## 2019-11-13 DIAGNOSIS — E876 Hypokalemia: Secondary | ICD-10-CM | POA: Diagnosis not present

## 2019-11-16 ENCOUNTER — Encounter: Payer: Self-pay | Admitting: Family Medicine

## 2019-11-16 ENCOUNTER — Other Ambulatory Visit: Payer: Self-pay

## 2019-11-16 ENCOUNTER — Ambulatory Visit (INDEPENDENT_AMBULATORY_CARE_PROVIDER_SITE_OTHER): Payer: PPO | Admitting: Family Medicine

## 2019-11-16 VITALS — BP 122/58 | HR 88 | Temp 98.3°F | Ht 68.0 in | Wt 206.0 lb

## 2019-11-16 DIAGNOSIS — N186 End stage renal disease: Secondary | ICD-10-CM

## 2019-11-16 DIAGNOSIS — E785 Hyperlipidemia, unspecified: Secondary | ICD-10-CM | POA: Diagnosis not present

## 2019-11-16 DIAGNOSIS — I10 Essential (primary) hypertension: Secondary | ICD-10-CM

## 2019-11-16 DIAGNOSIS — Z992 Dependence on renal dialysis: Secondary | ICD-10-CM

## 2019-11-16 DIAGNOSIS — E1159 Type 2 diabetes mellitus with other circulatory complications: Secondary | ICD-10-CM | POA: Diagnosis not present

## 2019-11-16 DIAGNOSIS — E1122 Type 2 diabetes mellitus with diabetic chronic kidney disease: Secondary | ICD-10-CM | POA: Diagnosis not present

## 2019-11-16 DIAGNOSIS — I25119 Atherosclerotic heart disease of native coronary artery with unspecified angina pectoris: Secondary | ICD-10-CM | POA: Diagnosis not present

## 2019-11-16 DIAGNOSIS — F331 Major depressive disorder, recurrent, moderate: Secondary | ICD-10-CM

## 2019-11-16 DIAGNOSIS — E1169 Type 2 diabetes mellitus with other specified complication: Secondary | ICD-10-CM

## 2019-11-16 LAB — PHOSPHORUS: Phosphorus: 6.7

## 2019-11-16 MED ORDER — LANSOPRAZOLE 15 MG PO CPDR: DELAYED_RELEASE_CAPSULE | ORAL | 0 refills | Status: DC

## 2019-11-16 MED ORDER — CARVEDILOL 3.125 MG PO TABS: 3.1250 mg | ORAL_TABLET | Freq: Two times a day (BID) | ORAL | 3 refills | Status: DC

## 2019-11-16 NOTE — Progress Notes (Signed)
Phone [phone] In person visit   Subjective:   Rodney Farley is a 86 y.o. year old very pleasant male patient who presents for/with See problem oriented charting Chief Complaint  Patient presents with  . Diabetes   This visit occurred during the SARS-CoV-2 public health emergency.  Safety protocols were in place, including screening questions prior to the visit, additional usage of staff PPE, and extensive cleaning of exam room while observing appropriate contact time as indicated for disinfecting solutions.   Past Medical History-  Patient Active Problem List   Diagnosis Date Noted  . Ruptured appendicitis 03/26/2017    Priority: High  . Marital stress 02/18/2017    Priority: High  . COPD (chronic obstructive pulmonary disease) ([place])     Priority: High  . Chronic systolic CHF (congestive heart failure) ([place]) 04/08/2015    Priority: High  . DOE (dyspnea on exertion), due to significant CAD 03/28/2015    Priority: High  . ESRD on hemodialysis ([place]) 03/04/2015    Priority: High  . Coronary artery disease involving native coronary artery of native heart with angina pectoris ([place]) 09/13/2014    Priority: High  . Depression, major, recurrent, moderate ([place]) 08/05/2012    Priority: High  . Controlled type 2 diabetes mellitus with chronic kidney disease on chronic dialysis ([place]) 08/05/2012    Priority: High  . Ischemic cardiomyopathy 11/03/2011    Priority: High  . Atrioventricular block, complete (HCC)     Priority: High  . Pacemaker     Priority: High  . Hyperlipidemia associated with type 2 diabetes mellitus ([place]) 04/13/2018    Priority: Medium  . BPH associated with nocturia 04/27/2016    Priority: Medium  . Diabetic peripheral neuropathy (HCC)     Priority: Medium  . Bell's palsy     Priority: Medium  . Gout 06/26/2015    Priority: Medium  . Sleep apnea 08/05/2012    Priority: Medium  . Hypertension associated with diabetes ([place]) 08/05/2012    Priority:  Medium  . S/P cholecystectomy 10/13/2017    Priority: Low  . Trigger finger 06/10/2016    Priority: Low  . Diverticulitis 04/08/2015    Priority: Low  . GERD (gastroesophageal reflux disease) 08/07/2012    Priority: Low  . Obesity     Priority: Low  . Coagulation defect, unspecified ([place]) 05/11/2019  . Encounter for immunization 04/25/2019  . Mild protein-calorie malnutrition ([place]) 04/19/2019  . Thrombocytopenia ([place]) 04/13/2019  . Hypokalemia 04/12/2019  . Anaphylactic shock, unspecified, initial encounter 04/10/2019  . Anemia in chronic kidney disease 04/10/2019  . Complication of vascular dialysis catheter 04/10/2019  . Iron deficiency anemia, unspecified 04/10/2019  . Secondary hyperparathyroidism of renal origin ([place]) 04/10/2019  . Hyperkalemia, diminished renal excretion 04/01/2019  . Diarrhea 05/11/2017    Medications- reviewed and updated Current Outpatient Medications  Medication Sig Dispense Refill  . aspirin EC 81 MG tablet Take 81 mg by mouth daily.    [name] atorvastatin (LIPITOR) 40 MG tablet Take 1 tablet by mouth once daily 90 tablet 3  . Carboxymethylcellul-Glycerin (LUBRICATING EYE DROPS OP) Place 1 drop into both eyes 3 (three) times daily as needed (dry eyes).    . carvedilol (COREG) 6.25 MG tablet Take 1 tablet (6.25 mg total) by mouth 2 (two) times daily with a meal. Hold on mornings you have dialysis. 180 tablet 3  . ciclopirox (PENLAC) 8 % solution     . erythromycin ophthalmic ointment Place 1 application into the left eye at  bedtime.    . gabapentin (NEURONTIN) 100 MG capsule Take by mouth.    [name] glucose blood (FREESTYLE TEST STRIPS) test strip Use to check blood sugar daily 100 each 3  . glucose monitoring kit (FREESTYLE) monitoring kit USE TO MONITOR BLOOD GLUCOSE AS DIRECTED 1 each 1  . lansoprazole (PREVACID) 15 MG capsule TAKE 1 CAPSULE BY MOUTH ONCE DAILY AT  12  NOON 90 capsule 0  . nitroGLYCERIN (NITROSTAT) 0.4 MG SL tablet Place 1 tablet (0.4 mg  total) under the tongue every 5 (five) minutes as needed for chest pain. 2 doses before calling for emergent help 25 tablet 3  . oxymetazoline (AFRIN) 0.05 % nasal spray Place 1 spray into both nostrils 2 (two) times daily as needed for congestion.    . sertraline (ZOLOFT) 100 MG tablet Take 1.5 tablets (150 mg total) by mouth daily. 135 tablet 3  . sevelamer carbonate (RENVELA) 800 MG tablet Take 1,600 mg by mouth 3 (three) times daily.    . SPS 15 GM/60ML suspension     . triamcinolone cream (KENALOG) 0.1 %     . vitamin B-12 (CYANOCOBALAMIN) 1000 MCG tablet Take 1,000 mcg by mouth daily.    . mirtazapine (REMERON) 7.5 MG tablet Take 0.5-1 tablets (3.75-7.5 mg total) by mouth at bedtime. (Patient not taking: Reported on 11/16/2019) 30 tablet 5  . saccharomyces boulardii (FLORASTOR) 250 MG capsule Take 1 capsule (250 mg total) by mouth 2 (two) times daily. (Patient not taking: Reported on 11/16/2019) 30 capsule 0   No current facility-administered medications for this visit.     Objective:  BP (!) 122/58   Pulse 88   Temp 98.3 F (36.8 C) (Temporal)   Ht '5\' 8"'  (1.727 m)   Wt 206 lb (93.4 kg)   SpO2 96%   BMI 31.32 kg/m  Gen: NAD, resting comfortably CV: RRR no murmurs rubs or gallops Lungs: CTAB no crackles, wheeze, rhonchi Abdomen: soft/nontender/nondistended/normal bowel sounds. No rebound or guarding.  Ext: no edema Skin: warm, dry Neuro: walks with cane     Assessment and Plan   #End-stage renal disease/secondary hyperparathyroidism S:Patient on dialysis on MWF.  Also compliant with Renvela to lower phosphorus with hyperparathyroidism A/P: Patient compliant with dialysis sessions- will continue and continue renvela as well  # Diabetes S: Medication: Not taking any medications at this time. Controlling with diet and exercise last A1C 10/27/19 was 6.7 CBGs-  does not check blood sugars at home recently.  Lab Results  Component Value Date   HGBA1C 6.7 10/27/2019   HGBA1C 5.7  (H) 04/02/2019   HGBA1C 5.3 08/03/2018  A/P: stable- continue without meds.   #hypertension S: medication: Carvedilol 6.25 mg twice daily (down from 12.5 mg BID).  Hold on mornings of dialysis as well as evening before dialysis Home readings #s:  He has gotten readings as low as 76/47 at home last night after dialysis but it improved to 100/54 later that night. Continues to get lows related to dialysis but does seem slightly better than last visit A/P: poor control due to hypotension with dialysis. slightly better- not getting as many lows but still happening some. We opted to reduce further to 3.125 mg BID and hold night before dialysis and morning of dialysis to try to avoid lows.   # Depression S: Medication: poor control on sertraline 129m daily  [facility] 11/16/2019 08/01/2019 05/30/2019  Decreased Interest '3 3 1  ' Down, Depressed, Hopeless '3 3 1  ' PHQ -  2 Score '6 6 2  ' Altered sleeping '3 3 1  ' Tired, decreased energy 3 3 0  Change in appetite 0 3 0  Feeling bad or failure about yourself  '3 3 1  ' Trouble concentrating 0 3 1  Moving slowly or fidgety/restless 0 0 0  Suicidal thoughts 0 0 0  PHQ-9 Score '15 21 5  ' Difficult doing work/chores Extremely dIfficult Very difficult Somewhat difficult  Some recent data might be hidden  A/P: poor control again- once again encouraged follow up with Trey Paula- our team is going to call and check in 2 weeks from   #hyperlipidemia/CAD S: Medication:Atorvastatin 40 mg, aspirin 81 mg  A/P: asymptomatic- continue current medicines  - sparing afrin with nosebleeds Recommended follow up:  14 weeks recommended  Future Appointments  Date Time Provider [place]  11/23/2019 11:15 AM Evelina Bucy, DPM [facility]  12/19/2019  8:30 AM [facility] DEVICE REMOTES [facility]  03/19/2020  8:30 AM [facility] DEVICE REMOTES [facility]  06/18/2020  8:30 AM [facility] DEVICE REMOTES [facility]   Lab/Order associations:   ICD-10-CM   1. Controlled type 2 diabetes mellitus with chronic kidney disease on chronic dialysis, without long-term current use of insulin (HCC)  E11.22    N18.6    Z99.2   2. Hypertension associated with diabetes ([place])  E11.59    I10   3. Depression, major, recurrent, moderate ([place])  F33.1   4. ESRD on hemodialysis (HCC)  N18.6    Z99.2   5. Coronary artery disease involving native coronary artery of native heart with angina pectoris ([place])  I25.119   6. Hyperlipidemia associated with type 2 diabetes mellitus ([place])  E11.69    E78.5     Meds ordered this encounter  Medications  . lansoprazole (PREVACID) 15 MG capsule    Sig: TAKE 1 CAPSULE BY MOUTH ONCE DAILY AT  12  NOON    Dispense:  90 capsule    Refill:  0  . carvedilol (COREG) 3.125 MG tablet    Sig: Take 1 tablet (3.125 mg total) by mouth 2 (two) times daily with a meal. Hold on mornings you have dialysis and the night before    Dispense:  180 tablet    Refill:  3   Return precautions advised.  Garret Reddish, MD

## 2019-11-16 NOTE — Patient Instructions (Addendum)
               We are reducing your Carvedilol to 3.125mg . We have sent that into your pharmacy.    Do not take the Carvedilol the night before or the morning  of your dialysis. Check your readings and if they start to get high let us know.   Give Lattie Haw a call to set up an appointment.      Phone: [phone]   Please ask them to do a lipid panel next time they do labs at dialysis.   Please bring your Medication bottles with you to your next appointment.

## 2019-11-23 ENCOUNTER — Other Ambulatory Visit: Payer: Self-pay

## 2019-11-23 ENCOUNTER — Ambulatory Visit: Payer: PPO | Admitting: Podiatry

## 2019-11-23 ENCOUNTER — Other Ambulatory Visit (HOSPITAL_COMMUNITY): Payer: PPO

## 2019-11-23 ENCOUNTER — Ambulatory Visit: Payer: PPO

## 2019-11-23 VITALS — Temp 97.2°F

## 2019-11-23 DIAGNOSIS — B351 Tinea unguium: Secondary | ICD-10-CM | POA: Diagnosis not present

## 2019-11-23 DIAGNOSIS — E1122 Type 2 diabetes mellitus with diabetic chronic kidney disease: Secondary | ICD-10-CM

## 2019-11-23 DIAGNOSIS — N186 End stage renal disease: Secondary | ICD-10-CM

## 2019-11-29 ENCOUNTER — Other Ambulatory Visit: Payer: Self-pay | Admitting: *Deleted

## 2019-11-29 DIAGNOSIS — Z992 Dependence on renal dialysis: Secondary | ICD-10-CM

## 2019-12-07 ENCOUNTER — Ambulatory Visit: Payer: PPO | Admitting: Physician Assistant

## 2019-12-07 ENCOUNTER — Ambulatory Visit (HOSPITAL_COMMUNITY)
Admission: RE | Admit: 2019-12-07 | Discharge: 2019-12-07 | Disposition: A | Discharge status: Home / Self Care | Payer: PPO | Source: Ambulatory Visit | Attending: Vascular Surgery | Admitting: Vascular Surgery

## 2019-12-07 ENCOUNTER — Other Ambulatory Visit: Payer: Self-pay

## 2019-12-07 VITALS — BP 144/70 | HR 79 | Temp 98.3°F | Resp 20 | Ht 68.0 in | Wt 208.0 lb

## 2019-12-07 DIAGNOSIS — Z992 Dependence on renal dialysis: Secondary | ICD-10-CM | POA: Insufficient documentation

## 2019-12-07 DIAGNOSIS — N186 End stage renal disease: Secondary | ICD-10-CM

## 2019-12-07 MED ORDER — TRAMADOL HCL 50 MG PO TABS: 50.0000 mg | ORAL_TABLET | Freq: Two times a day (BID) | ORAL | 0 refills | Status: DC | PRN

## 2019-12-07 NOTE — Progress Notes (Signed)
Office Note     CC:  follow up Requesting Provider:  [name], MD  HPI: Rodney Farley is a 86 y.o. (1932/06/11) male who presents for follow up on right brachiocephalic AV fistula. It was initially created in July 2020 by Dr. Donzetta Matters. He has been dialyzing MWF without issues.He was last seen on 08/24/19 at which time he was having some pain and numbness in his right hand mainly when connected to the HD machine. These resolved when disconnected. Now over the last two weeks he has had episodes of extreme pain in right forearm and into right hand that occur at HD and also after he is disconnected throughout rest of day and evening following HD. He gets cramps in his right hand and coldness. He says there have been several nights where he almost has gone to the ER because of the unbearable pain. He additionally has been getting pain in both legs following dialysis as well. He otherwise denies any claudication symptoms or rest pain. He denies any non healing wounds on his right hand or lower extremities. He is voicing his concern that he is no longer able to tolerate the pain he is having in his right arm and hand  Of note he has left sided AICD. He is right hand dominant but due to the AICD he had right AV fistula placed  The pt is on a statin for cholesterol management.  The pt is on a daily aspirin.   Other AC: none The pt is on BB for hypertension.   The pt is diabetic.   Tobacco hx: Former, 2009  Past Medical History:  Diagnosis Date  . AICD (automatic cardioverter/defibrillator) present   . AKI (acute kidney injury) ([place]) 03/27/2017  . Anemia   . Anginal pain ([place])   . Anxiety   . Arm pain 05/08/2015   LEFT ARM  . Arthritis   . Atrioventricular block, complete ([place])    a. 2010 s/p pacemaker.  . Bell's palsy    left side. after shingles episode  . Bilateral renal cysts 07/23/2017   Simple and hemorrhagic noted on CT ab/pelvis   . BPH associated with nocturia   .  Cardiomegaly   . Chronic systolic CHF (congestive heart failure) ([place])    EF normalized by Echo 2019  . CKD (chronic kidney disease), stage IV ([place])   . COPD (chronic obstructive pulmonary disease) ([facility])    Severe  . Coronary artery disease    a. s/p MI in 1994/1995 while in [place] s/p questionable PCI. 03/2015: progression of disease, for staged PCI.  [name] Depression   . Diabetic peripheral neuropathy ([place])   . Diarrhea   . Diverticulitis   . DOE (dyspnea on exertion) 03/28/2015  . Full dentures   . Gallstones   . GERD (gastroesophageal reflux disease)   . Gout   . Hard of hearing    B/L  . Heart murmur   . History of chronic pancreatitis 07/23/2017   noted on CT abd/pelvis  . History of shingles   . Hypercholesterolemia   . Hypertension   . Ischemic cardiomyopathy   . MI (myocardial infarction) ([place]) 1994; 1995  . Neuropathy    IN LOWER EXTREMITIES  . Nosebleed 10/06/2017   for 2 months most recent 10/06/2017  . Obesity   . Pacemaker    medtronic>>> MDT ICD 09/23/15  . Ruptured appendicitis   . Sleep apnea    "sleeps w/humidifyer when he panics and gets short of  breath" (04/08/2015)  . TIA (transient ischemic attack) X 3  . Trigger middle finger of left hand   . Type II diabetes mellitus ([place])   . Wears glasses   . Wears hearing aid     Past Surgical History:  Procedure Laterality Date  . APPENDECTOMY    . AV FISTULA PLACEMENT Right 02/07/2019   Procedure: ARTERIOVENOUS (AV) FISTULA CREATION RIGHT UPPER ARM;  Surgeon: Waynetta Sandy, MD;  Location: [facility];  Service: Vascular;  Laterality: Right;  . CARDIAC CATHETERIZATION N/A 03/29/2015   Procedure: Right/Left Heart Cath and Coronary Angiography;  Surgeon: Izzac M [name], MD;  Location: [facility];  Service: Cardiovascular;  Laterality: N/A;  . [place]   "after my MI; put me on heart RX after cath"  . CARDIAC CATHETERIZATION N/A 04/09/2015   Procedure: Coronary Stent Intervention;   Surgeon: Lundy M [name], MD;  Location: [facility];  Service: Cardiovascular;  Laterality: N/A;  . CATARACT EXTRACTION W/ INTRAOCULAR LENS  IMPLANT, BILATERAL    . CHOLECYSTECTOMY N/A 10/13/2017   Procedure: LAPAROSCOPIC CHOLECYSTECTOMY WITH LYSIS OF ADHESIONS;  Surgeon: Ileana Roup, MD;  Location: [facility];  Service: General;  Laterality: N/A;  . COLONOSCOPY    . DENTAL SURGERY    . EP IMPLANTABLE DEVICE N/A 09/23/2015   MDT CRT-D, Dr. Caryl Comes  . HIATAL HERNIA REPAIR  1977  . ILEOCECETOMY N/A 03/27/2017   Procedure: ILEOCECECTOMY;  Surgeon: Ileana Roup, MD;  Location: [facility];  Service: General;  Laterality: N/A;  . INSERT / REPLACE / REMOVE PACEMAKER  07/2008   Complete heart block status post DDD with good function  . IR FLUORO GUIDE CV LINE RIGHT  04/03/2019  . IR US GUIDE VASC ACCESS RIGHT  04/03/2019  . LAPAROTOMY N/A 03/27/2017   Procedure: EXPLORATORY LAPAROTOMY;  Surgeon: Ileana Roup, MD;  Location: [facility];  Service: General;  Laterality: N/A;  . TONSILLECTOMY    . UPPER GI ENDOSCOPY      Social History   Socioeconomic History  . Marital status: Legally Separated    Spouse name: Not on file  . Number of children: 1  . Years of education: college  . Highest education level: Not on file  Occupational History  . Occupation: Retired  Tobacco Use  . Smoking status: Former Smoker    Packs/day: 1.50    Years: 54.00    Pack years: 81.00    Types: Cigarettes    Quit date: 07/18/2007    Years since quitting: 12.3  . Smokeless tobacco: Never Used  Substance and Sexual Activity  . Alcohol use: Yes    Comment: rare  . Drug use: No  . Sexual activity: Never  Other Topics Concern  . Not on file  Social History Narrative   Married 1994 (together since 1989) 1 son, 1 stepson. 3 grandkids, 6 great grandkids      Retired from [employer]. Had 2 years of collge.       Faith: Mormon      Here on [address] since 2004 from [place]; wife recently  left and relocated to [facility]    Social Determinants of [employer] Strain:   . Difficulty of Paying Living Expenses:   Food Insecurity:   . Worried About Charity fundraiser in the Last Year:   . Arboriculturist in the Last Year:   Transportation Needs:   . Lack of Transportation (  Medical):   [name] Lack of Transportation (Non-Medical):   Physical Activity:   . Days of Exercise per Week:   . Minutes of Exercise per Session:   Stress:   . Feeling of Stress :   Social Connections:   . Frequency of Communication with Friends and Family:   . Frequency of Social Gatherings with Friends and Family:   . Attends Religious Services:   . Active Member of Clubs or Organizations:   . Attends Archivist Meetings:   [name] Marital Status:   Intimate Partner Violence:   . Fear of Current or Ex-Partner:   . Emotionally Abused:   [name] Physically Abused:   . Sexually Abused:    Family History  Problem Relation Age of Onset  . Stroke Mother   . Leukemia Father   . Stroke Sister   . Heart attack Brother     Current Outpatient Medications  Medication Sig Dispense Refill  . aspirin EC 81 MG tablet Take 81 mg by mouth daily.    [name] atorvastatin (LIPITOR) 40 MG tablet Take 1 tablet by mouth once daily 90 tablet 3  . Carboxymethylcellul-Glycerin (LUBRICATING EYE DROPS OP) Place 1 drop into both eyes 3 (three) times daily as needed (dry eyes).    . carvedilol (COREG) 3.125 MG tablet Take 1 tablet (3.125 mg total) by mouth 2 (two) times daily with a meal. Hold on mornings you have dialysis and the night before 180 tablet 3  . ciclopirox (PENLAC) 8 % solution     . erythromycin ophthalmic ointment Place 1 application into the left eye at bedtime.    [name] glucose blood (FREESTYLE TEST STRIPS) test strip Use to check blood sugar daily 100 each 3  . glucose monitoring kit (FREESTYLE) monitoring kit USE TO MONITOR BLOOD GLUCOSE AS DIRECTED 1 each 1  . lansoprazole (PREVACID) 15 MG capsule  TAKE 1 CAPSULE BY MOUTH ONCE DAILY AT  12  NOON 90 capsule 0  . mirtazapine (REMERON) 7.5 MG tablet Take 0.5-1 tablets (3.75-7.5 mg total) by mouth at bedtime. 30 tablet 5  . nitroGLYCERIN (NITROSTAT) 0.4 MG SL tablet Place 1 tablet (0.4 mg total) under the tongue every 5 (five) minutes as needed for chest pain. 2 doses before calling for emergent help 25 tablet 3  . oxymetazoline (AFRIN) 0.05 % nasal spray Place 1 spray into both nostrils 2 (two) times daily as needed for congestion.    . saccharomyces boulardii (FLORASTOR) 250 MG capsule Take 1 capsule (250 mg total) by mouth 2 (two) times daily. 30 capsule 0  . sertraline (ZOLOFT) 100 MG tablet Take 1.5 tablets (150 mg total) by mouth daily. 135 tablet 3  . sevelamer carbonate (RENVELA) 800 MG tablet Take 1,600 mg by mouth 3 (three) times daily.    . SPS 15 GM/60ML suspension     . triamcinolone cream (KENALOG) 0.1 %     . vitamin B-12 (CYANOCOBALAMIN) 1000 MCG tablet Take 1,000 mcg by mouth daily.     No current facility-administered medications for this visit.    Allergies  Allergen Reactions  . Bee Venom Anaphylaxis  . Lyrica [Pregabalin] Other (See Comments)    hallucinations  . Prednisone Other (See Comments)    hallucinations  . Zocor [Simvastatin] Nausea Only and Other (See Comments)    Headache with brand name only.  Can take the generic.     REVIEW OF SYSTEMS:  '[X]'  denotes positive finding, '[ ]'  denotes negative finding Cardiac  Comments:  Chest  pain or chest pressure:    Shortness of breath upon exertion:    Short of breath when lying flat:    Irregular heart rhythm:        Vascular    Pain in calf, thigh, or hip brought on by ambulation:    Pain in feet at night that wakes you up from your sleep:     Blood clot in your veins:    Leg swelling:         Pulmonary    Oxygen at home:    Productive cough:     Wheezing:         Neurologic    Sudden weakness in arms or legs:     Sudden numbness in arms or legs:       Sudden onset of difficulty speaking or slurred speech:    Temporary loss of vision in one eye:     Problems with dizziness:         Gastrointestinal    Blood in stool:     Vomited blood:         Genitourinary    Burning when urinating:     Blood in urine:        Psychiatric    Major depression:         Hematologic    Bleeding problems:    Problems with blood clotting too easily:        Skin    Rashes or ulcers:        Constitutional    Fever or chills:      PHYSICAL EXAMINATION:  Vitals:   12/07/19 0831  BP: (!) 144/70  Pulse: 79  Resp: 20  Temp: 98.3 F (36.8 C)  SpO2: 99%  Weight: 208 lb (94.3 kg)  Height: '5\' 8"'  (1.727 m)    General:  WDWN in NAD; vital signs documented above Gait: normal HENT: WNL, normocephalic Pulmonary: normal non-labored breathing , without Rales, rhonchi,  wheezing Cardiac: regular HR, without  Murmurs without carotid bruit Abdomen: soft, NT, no masses Skin: without rashes Vascular Exam/Pulses:  Right Left  Radial 1+ (weak) 2+ (normal)  Ulnar 1+ (weak) 2+ (normal)  Femoral 2+ (normal) 2+ (normal)  Popliteal Not palpable Not palpable  DP 1+ (weak) 1+ (weak)  PT 2+ (normal) 2+ (normal)   Extremities: without ischemic changes, without Gangrene , without cellulitis; without open wounds;  Musculoskeletal: no muscle wasting or atrophy  Neurologic: A&O X 3;  No focal weakness or paresthesias are detected Psychiatric:  The pt has Normal affect.   Non-Invasive Vascular Imaging:  12/07/19  VAS [place] Steal Exam Findings:    +-------------+---------------------------+---------------------------+----  ----+            PPG waveform with      PPG waveform w/o     Comments             compression        compression           +-------------+---------------------------+---------------------------+----  ----+  Right Digit 2     increased                           +-------------+---------------------------+---------------------------+----  ----+          Pressure w compression   Pressure w/o compression        +-------------+---------------------------+---------------------------+----  ----+  Right Digit 2      128  65              +-------------+---------------------------+---------------------------+----  ----+   Summary:  Right 2nd digit waveform and pressure increased with AVF compression.    ASSESSMENT/PLAN:: 86 y.o. male here for follow up of right AV fistula with possible steal symptoms. He is ESRD on HD MWF. Patent right brachiocephalic fistula with palpable thrill. Right hand well perfused with 1+ palpable radial pulse. Explained to patient that it is common to have numbness, weakness, and even some pain and coolness in right hand when connected to the hemodialysis machine due to the flow dynamics. I also explained that by nature of a fistula they do steal the flow away from the hand. I re assured him that he has adequate flow to his right hand. He is currently at point where his steal symptoms are no longer tolerable. I have discussed with him that we usually only ligate the fistula for steal if the symptoms are intolerable or tissue loss. He voices his understanding. However he ask that I call and discuss options with his Son [name] and daughter-in-law Advik Weatherspoon who are his POA. - I have called to discuss today's visit with patients son [name]. [name] expresses that his father has very low tolerance for pain and in taking care of him feels that his pain in the right arm is more cramping in nature and likely far less severe than what the patient is expressing [name] and I had an extensive conversation regarding options for ligation with TDC and possibly new AF fistula vs Graft and risks of likely recurrent steal when going into same arm. I explained that his current fistula is  working great and if we can avoid ligation that would be ideal.  He may have to have discussion with Nephrologist regarding shorter duration of HD sessions for tolerance as long as they are effective session - I also suggested possible option for PRN pain medication prescription by PCP or Nephrologist to be given in order to avoid further surgeries - We discussed benefit of keeping right hand warm with glove at dialysis and right hand/ arm exercises -  I think his symptoms in the lower extremties are more volume related/ shifts in electrolytes as these only occur while on dialysis - Per the son, he will have a discussion with the Nephrologist but would like to hold of on any further scheduling until he feels that they have exhausted all conservative options - We will follow up as needed with patient at this time  Karoline Caldwell, PA-C [facility] [phone]  Clinic MD:  Dr. Oneida Alar

## 2019-12-11 DIAGNOSIS — Z992 Dependence on renal dialysis: Secondary | ICD-10-CM | POA: Diagnosis not present

## 2019-12-11 DIAGNOSIS — E1129 Type 2 diabetes mellitus with other diabetic kidney complication: Secondary | ICD-10-CM | POA: Diagnosis not present

## 2019-12-11 DIAGNOSIS — N186 End stage renal disease: Secondary | ICD-10-CM | POA: Diagnosis not present

## 2019-12-13 DIAGNOSIS — E876 Hypokalemia: Secondary | ICD-10-CM | POA: Diagnosis not present

## 2019-12-13 DIAGNOSIS — N2581 Secondary hyperparathyroidism of renal origin: Secondary | ICD-10-CM | POA: Diagnosis not present

## 2019-12-13 DIAGNOSIS — D631 Anemia in chronic kidney disease: Secondary | ICD-10-CM | POA: Diagnosis not present

## 2019-12-13 DIAGNOSIS — Z992 Dependence on renal dialysis: Secondary | ICD-10-CM | POA: Diagnosis not present

## 2019-12-13 DIAGNOSIS — D509 Iron deficiency anemia, unspecified: Secondary | ICD-10-CM | POA: Diagnosis not present

## 2019-12-13 DIAGNOSIS — N186 End stage renal disease: Secondary | ICD-10-CM | POA: Diagnosis not present

## 2019-12-19 ENCOUNTER — Ambulatory Visit (INDEPENDENT_AMBULATORY_CARE_PROVIDER_SITE_OTHER): Payer: PPO | Admitting: *Deleted

## 2019-12-19 DIAGNOSIS — I255 Ischemic cardiomyopathy: Secondary | ICD-10-CM

## 2019-12-19 DIAGNOSIS — I442 Atrioventricular block, complete: Secondary | ICD-10-CM

## 2019-12-19 LAB — CUP PACEART REMOTE DEVICE CHECK
Battery Remaining Longevity: 19 mo
Battery Voltage: 2.9 V
Brady Statistic AP VP Percent: 79.59 %
Brady Statistic AP VS Percent: 0.05 %
Brady Statistic AS VP Percent: 20.21 %
Brady Statistic AS VS Percent: 0.14 %
Brady Statistic RA Percent Paced: 78.14 %
Brady Statistic RV Percent Paced: 99.02 %
Date Time Interrogation Session: 20210608044223
HighPow Impedance: 69 Ohm
Implantable Lead Implant Date: 20100112
Implantable Lead Implant Date: 20170313
Implantable Lead Implant Date: 20170313
Implantable Lead Location: 753858
Implantable Lead Location: 753859
Implantable Lead Location: 753860
Implantable Lead Model: 4598
Implantable Lead Model: 5076
Implantable Pulse Generator Implant Date: 20170313
Lead Channel Impedance Value: 361 Ohm
Lead Channel Impedance Value: 399 Ohm
Lead Channel Impedance Value: 418 Ohm
Lead Channel Impedance Value: 418 Ohm
Lead Channel Impedance Value: 456 Ohm
Lead Channel Impedance Value: 456 Ohm
Lead Channel Impedance Value: 456 Ohm
Lead Channel Impedance Value: 532 Ohm
Lead Channel Impedance Value: 703 Ohm
Lead Channel Impedance Value: 703 Ohm
Lead Channel Impedance Value: 722 Ohm
Lead Channel Impedance Value: 760 Ohm
Lead Channel Impedance Value: 760 Ohm
Lead Channel Pacing Threshold Amplitude: 0.5 V
Lead Channel Pacing Threshold Amplitude: 1.25 V
Lead Channel Pacing Threshold Amplitude: 1.75 V
Lead Channel Pacing Threshold Pulse Width: 0.4 ms
Lead Channel Pacing Threshold Pulse Width: 0.4 ms
Lead Channel Pacing Threshold Pulse Width: 0.4 ms
Lead Channel Sensing Intrinsic Amplitude: 1 mV
Lead Channel Sensing Intrinsic Amplitude: 1 mV
Lead Channel Sensing Intrinsic Amplitude: 12.375 mV
Lead Channel Sensing Intrinsic Amplitude: 12.375 mV
Lead Channel Setting Pacing Amplitude: 2.5 V
Lead Channel Setting Pacing Amplitude: 2.5 V
Lead Channel Setting Pacing Amplitude: 3.25 V
Lead Channel Setting Pacing Pulse Width: 0.4 ms
Lead Channel Setting Pacing Pulse Width: 0.4 ms
Lead Channel Setting Sensing Sensitivity: 0.3 mV

## 2019-12-20 NOTE — Progress Notes (Signed)
Remote ICD transmission.   

## 2020-01-10 DIAGNOSIS — Z992 Dependence on renal dialysis: Secondary | ICD-10-CM | POA: Diagnosis not present

## 2020-01-10 DIAGNOSIS — N186 End stage renal disease: Secondary | ICD-10-CM | POA: Diagnosis not present

## 2020-01-10 DIAGNOSIS — E1129 Type 2 diabetes mellitus with other diabetic kidney complication: Secondary | ICD-10-CM | POA: Diagnosis not present

## 2020-01-12 DIAGNOSIS — E1129 Type 2 diabetes mellitus with other diabetic kidney complication: Secondary | ICD-10-CM | POA: Diagnosis not present

## 2020-01-12 DIAGNOSIS — Z992 Dependence on renal dialysis: Secondary | ICD-10-CM | POA: Diagnosis not present

## 2020-01-12 DIAGNOSIS — D631 Anemia in chronic kidney disease: Secondary | ICD-10-CM | POA: Diagnosis not present

## 2020-01-12 DIAGNOSIS — N186 End stage renal disease: Secondary | ICD-10-CM | POA: Diagnosis not present

## 2020-01-12 DIAGNOSIS — N2581 Secondary hyperparathyroidism of renal origin: Secondary | ICD-10-CM | POA: Diagnosis not present

## 2020-01-12 DIAGNOSIS — D509 Iron deficiency anemia, unspecified: Secondary | ICD-10-CM | POA: Diagnosis not present

## 2020-01-12 DIAGNOSIS — E876 Hypokalemia: Secondary | ICD-10-CM | POA: Diagnosis not present

## 2020-01-14 NOTE — Progress Notes (Signed)
Subjective:  Patient ID: Rodney Farley, male    DOB: 1932-02-08,  MRN: [id number]  Chief Complaint  Patient presents with  . Nail Problem    Nail trim 1-5 bilateral    86 y.o. male presents  for diabetic foot care. Last AMBS was unkown. Now on HD. Denies numbness and tingling in their feet. Denies cramping in legs and thighs.  Review of Systems: Negative except as noted in the HPI. Denies N/V/F/Ch.  Past Medical History:  Diagnosis Date  . AICD (automatic cardioverter/defibrillator) present   . AKI (acute kidney injury) ([place]) 03/27/2017  . Anemia   . Anginal pain ([place])   . Anxiety   . Arm pain 05/08/2015   LEFT ARM  . Arthritis   . Atrioventricular block, complete ([place])    a. 2010 s/p pacemaker.  . Bell's palsy    left side. after shingles episode  . Bilateral renal cysts 07/23/2017   Simple and hemorrhagic noted on CT ab/pelvis   . BPH associated with nocturia   . Cardiomegaly   . Chronic systolic CHF (congestive heart failure) ([place])    EF normalized by Echo 2019  . CKD (chronic kidney disease), stage IV ([place])   . COPD (chronic obstructive pulmonary disease) ([facility])    Severe  . Coronary artery disease    a. s/p MI in 1994/1995 while in [place] s/p questionable PCI. 03/2015: progression of disease, for staged PCI.  [name] Depression   . Diabetic peripheral neuropathy ([place] [place])   . Diarrhea   . Diverticulitis   . DOE (dyspnea on exertion) 03/28/2015  . Full dentures   . Gallstones   . GERD (gastroesophageal reflux disease)   . Gout   . Hard of hearing    B/L  . Heart murmur   . History of chronic pancreatitis 07/23/2017   noted on CT abd/pelvis  . History of shingles   . Hypercholesterolemia   . Hypertension   . Ischemic cardiomyopathy   . MI (myocardial infarction) ([place]) 1994; 1995  . Neuropathy    IN LOWER EXTREMITIES  . Nosebleed 10/06/2017   for 2 months most recent 10/06/2017  . Obesity   . Pacemaker    medtronic>>> MDT ICD 09/23/15  . Ruptured  appendicitis   . Sleep apnea    "sleeps w/humidifyer when he panics and gets short of breath" (04/08/2015)  . TIA (transient ischemic attack) X 3  . Trigger middle finger of left hand   . Type II diabetes mellitus ([place])   . Wears glasses   . Wears hearing aid     Current Outpatient Medications:  .  aspirin EC 81 MG tablet, Take 81 mg by mouth daily., Disp: , Rfl:  .  Carboxymethylcellul-Glycerin (LUBRICATING EYE DROPS OP), Place 1 drop into both eyes 3 (three) times daily as needed (dry eyes)., Disp: , Rfl:  .  ciclopirox (PENLAC) 8 % solution, , Disp: , Rfl:  .  erythromycin ophthalmic ointment, Place 1 application into the left eye at bedtime., Disp: , Rfl:  .  glucose blood (FREESTYLE TEST STRIPS) test strip, Use to check blood sugar daily, Disp: 100 each, Rfl: 3 .  glucose monitoring kit (FREESTYLE) monitoring kit, USE TO MONITOR BLOOD GLUCOSE AS DIRECTED, Disp: 1 each, Rfl: 1 .  mirtazapine (REMERON) 7.5 MG tablet, Take 0.5-1 tablets (3.75-7.5 mg total) by mouth at bedtime., Disp: 30 tablet, Rfl: 5 .  nitroGLYCERIN (NITROSTAT) 0.4 MG SL tablet, Place 1 tablet (0.4 mg total) under the tongue every 5 (  five) minutes as needed for chest pain. 2 doses before calling for emergent help, Disp: 25 tablet, Rfl: 3 .  oxymetazoline (AFRIN) 0.05 % nasal spray, Place 1 spray into both nostrils 2 (two) times daily as needed for congestion., Disp: , Rfl:  .  saccharomyces boulardii (FLORASTOR) 250 MG capsule, Take 1 capsule (250 mg total) by mouth 2 (two) times daily., Disp: 30 capsule, Rfl: 0 .  sevelamer carbonate (RENVELA) 800 MG tablet, Take 1,600 mg by mouth 3 (three) times daily., Disp: , Rfl:  .  SPS 15 GM/60ML suspension, , Disp: , Rfl:  .  triamcinolone cream (KENALOG) 0.1 %, , Disp: , Rfl:  .  vitamin B-12 (CYANOCOBALAMIN) 1000 MCG tablet, Take 1,000 mcg by mouth daily., Disp: , Rfl:  .  atorvastatin (LIPITOR) 40 MG tablet, Take 1 tablet by mouth once daily, Disp: 90 tablet, Rfl: 3 .   carvedilol (COREG) 3.125 MG tablet, Take 1 tablet (3.125 mg total) by mouth 2 (two) times daily with a meal. Hold on mornings you have dialysis and the night before, Disp: 180 tablet, Rfl: 3 .  lansoprazole (PREVACID) 15 MG capsule, TAKE 1 CAPSULE BY MOUTH ONCE DAILY AT  12  NOON, Disp: 90 capsule, Rfl: 0 .  sertraline (ZOLOFT) 100 MG tablet, Take 1.5 tablets (150 mg total) by mouth daily., Disp: 135 tablet, Rfl: 3 .  traMADol (ULTRAM) 50 MG tablet, Take 1 tablet (50 mg total) by mouth every 12 (twelve) hours as needed for moderate pain or severe pain., Disp: 12 tablet, Rfl: 0  Social History   Tobacco Use  Smoking Status Former Smoker  . Packs/day: 1.50  . Years: 54.00  . Pack years: 81.00  . Types: Cigarettes  . Quit date: 07/18/2007  . Years since quitting: 12.5  Smokeless Tobacco Never Used    Allergies  Allergen Reactions  . Bee Venom Anaphylaxis  . Lyrica [Pregabalin] Other (See Comments)    hallucinations  . Prednisone Other (See Comments)    hallucinations  . Zocor [Simvastatin] Nausea Only and Other (See Comments)    Headache with brand name only.  Can take the generic.   Objective:   There were no vitals filed for this visit. There is no height or weight on file to calculate BMI. Constitutional Well developed. Well nourished.  Vascular Dorsalis pedis pulses present 1+ bilaterally  Posterior tibial pulses present 1+ bilaterally  Pedal hair growth diminished. Capillary refill normal to all digits.  No cyanosis or clubbing noted.  Neurologic Normal speech. Oriented to person, place, and time. Epicritic sensation to light touch grossly present bilaterally. Protective sensation with 5.07 monofilament  present bilaterally.  Dermatologic Nails elongated, thickened, dystrophic. Abrasions to the right second and third toes healing without warmth erythema or signs of infection. No skin lesions.  Orthopedic: Normal joint ROM without pain or crepitus bilaterally. No visible  deformities. No bony tenderness.   Assessment:   1. DM type 2 causing ESRD ([place])   2. Onychomycosis    Plan:  Patient was evaluated and treated and all questions answered.  Diabetes with ESRD, Onychomycosis -Educated on diabetic footcare. Diabetic risk level 1 -Nails x10 debrided sharply and manually with large nail nipper and rotary burr.    Procedure: Nail Debridement Rationale: Patient meets criteria for routine foot care due to ESRD Type of Debridement: manual, sharp debridement. Instrumentation: Nail nipper, rotary burr. Number of Nails: 10     No follow-ups on file.

## 2020-01-24 NOTE — Progress Notes (Signed)
Subjective:  Patient ID: Rodney Farley, male    DOB: Sep 23, 1931,  MRN: [id number]  Chief Complaint  Patient presents with  . Nail Problem    Thick, long toenails.  . Diabetes    Most recent HgbA1c = 5.8.    86 y.o. male presents  for diabetic foot care. Last AMBS was unkown. ESRD on HD. Last A1c 5.8 Denies numbness and tingling in their feet. Denies cramping in legs and thighs.  Review of Systems: Negative except as noted in the HPI. Denies N/V/F/Ch.  Past Medical History:  Diagnosis Date  . AICD (automatic cardioverter/defibrillator) present   . AKI (acute kidney injury) ([place]) 03/27/2017  . Anemia   . Anginal pain ([place])   . Anxiety   . Arm pain 05/08/2015   LEFT ARM  . Arthritis   . Atrioventricular block, complete ([place])    a. 2010 s/p pacemaker.  . Bell's palsy    left side. after shingles episode  . Bilateral renal cysts 07/23/2017   Simple and hemorrhagic noted on CT ab/pelvis   . BPH associated with nocturia   . Cardiomegaly   . Chronic systolic CHF (congestive heart failure) ([place])    EF normalized by Echo 2019  . CKD (chronic kidney disease), stage IV ([place])   . COPD (chronic obstructive pulmonary disease) ([facility])    Severe  . Coronary artery disease    a. s/p MI in 1994/1995 while in [place] s/p questionable PCI. 03/2015: progression of disease, for staged PCI.  [name] Depression   . Diabetic peripheral neuropathy ([place])   . Diarrhea   . Diverticulitis   . DOE (dyspnea on exertion) 03/28/2015  . Full dentures   . Gallstones   . GERD (gastroesophageal reflux disease)   . Gout   . Hard of hearing    B/L  . Heart murmur   . History of chronic pancreatitis 07/23/2017   noted on CT abd/pelvis  . History of shingles   . Hypercholesterolemia   . Hypertension   . Ischemic cardiomyopathy   . MI (myocardial infarction) ([place]) 1994; 1995  . Neuropathy    IN LOWER EXTREMITIES  . Nosebleed 10/06/2017   for 2 months most recent 10/06/2017  . Obesity   .  Pacemaker    medtronic>>> MDT ICD 09/23/15  . Ruptured appendicitis   . Sleep apnea    "sleeps w/humidifyer when he panics and gets short of breath" (04/08/2015)  . TIA (transient ischemic attack) X 3  . Trigger middle finger of left hand   . Type II diabetes mellitus ([place])   . Wears glasses   . Wears hearing aid     Current Outpatient Medications:  .  aspirin EC 81 MG tablet, Take 81 mg by mouth daily., Disp: , Rfl:  .  atorvastatin (LIPITOR) 40 MG tablet, Take 1 tablet by mouth once daily, Disp: 90 tablet, Rfl: 3 .  Carboxymethylcellul-Glycerin (LUBRICATING EYE DROPS OP), Place 1 drop into both eyes 3 (three) times daily as needed (dry eyes)., Disp: , Rfl:  .  carvedilol (COREG) 3.125 MG tablet, Take 1 tablet (3.125 mg total) by mouth 2 (two) times daily with a meal. Hold on mornings you have dialysis and the night before, Disp: 180 tablet, Rfl: 3 .  ciclopirox (PENLAC) 8 % solution, , Disp: , Rfl:  .  erythromycin ophthalmic ointment, Place 1 application into the left eye at bedtime., Disp: , Rfl:  .  glucose blood (FREESTYLE TEST STRIPS) test strip, Use to check  blood sugar daily, Disp: 100 each, Rfl: 3 .  glucose monitoring kit (FREESTYLE) monitoring kit, USE TO MONITOR BLOOD GLUCOSE AS DIRECTED, Disp: 1 each, Rfl: 1 .  lansoprazole (PREVACID) 15 MG capsule, TAKE 1 CAPSULE BY MOUTH ONCE DAILY AT  12  NOON, Disp: 90 capsule, Rfl: 0 .  mirtazapine (REMERON) 7.5 MG tablet, Take 0.5-1 tablets (3.75-7.5 mg total) by mouth at bedtime., Disp: 30 tablet, Rfl: 5 .  nitroGLYCERIN (NITROSTAT) 0.4 MG SL tablet, Place 1 tablet (0.4 mg total) under the tongue every 5 (five) minutes as needed for chest pain. 2 doses before calling for emergent help, Disp: 25 tablet, Rfl: 3 .  oxymetazoline (AFRIN) 0.05 % nasal spray, Place 1 spray into both nostrils 2 (two) times daily as needed for congestion., Disp: , Rfl:  .  saccharomyces boulardii (FLORASTOR) 250 MG capsule, Take 1 capsule (250 mg total) by mouth 2  (two) times daily., Disp: 30 capsule, Rfl: 0 .  sertraline (ZOLOFT) 100 MG tablet, Take 1.5 tablets (150 mg total) by mouth daily., Disp: 135 tablet, Rfl: 3 .  sevelamer carbonate (RENVELA) 800 MG tablet, Take 1,600 mg by mouth 3 (three) times daily., Disp: , Rfl:  .  SPS 15 GM/60ML suspension, , Disp: , Rfl:  .  triamcinolone cream (KENALOG) 0.1 %, , Disp: , Rfl:  .  vitamin B-12 (CYANOCOBALAMIN) 1000 MCG tablet, Take 1,000 mcg by mouth daily., Disp: , Rfl:  .  traMADol (ULTRAM) 50 MG tablet, Take 1 tablet (50 mg total) by mouth every 12 (twelve) hours as needed for moderate pain or severe pain., Disp: 12 tablet, Rfl: 0  Social History   Tobacco Use  Smoking Status Former Smoker  . Packs/day: 1.50  . Years: 54.00  . Pack years: 81.00  . Types: Cigarettes  . Quit date: 07/18/2007  . Years since quitting: 12.5  Smokeless Tobacco Never Used    Allergies  Allergen Reactions  . Bee Venom Anaphylaxis  . Lyrica [Pregabalin] Other (See Comments)    hallucinations  . Prednisone Other (See Comments)    hallucinations  . Zocor [Simvastatin] Nausea Only and Other (See Comments)    Headache with brand name only.  Can take the generic.   Objective:   Vitals:   11/23/19 1152  Temp: (!) 97.2 F (36.2 C)   There is no height or weight on file to calculate BMI. Constitutional Well developed. Well nourished.  Vascular Dorsalis pedis pulses present 1+ bilaterally  Posterior tibial pulses present 1+ bilaterally  Pedal hair growth diminished. Capillary refill normal to all digits.  No cyanosis or clubbing noted.  Neurologic Normal speech. Oriented to person, place, and time. Epicritic sensation to light touch grossly present bilaterally. Protective sensation with 5.07 monofilament  present bilaterally.  Dermatologic Nails elongated, thickened, dystrophic. Abrasions to the right second and third toes healing without warmth erythema or signs of infection. No skin lesions.  Orthopedic:  Normal joint ROM without pain or crepitus bilaterally. No visible deformities. No bony tenderness.   Assessment:   1. DM type 2 causing ESRD ([place])   2. Onychomycosis    Plan:  Patient was evaluated and treated and all questions answered.  Diabetes with ESRD, Onychomycosis -Nails debrided x10   Procedure: Nail Debridement Rationale: Patient meets criteria for routine foot care due to ESRD Type of Debridement: manual, sharp debridement. Instrumentation: Nail nipper, rotary burr. Number of Nails: 10      No follow-ups on file.

## 2020-02-10 DIAGNOSIS — N186 End stage renal disease: Secondary | ICD-10-CM | POA: Diagnosis not present

## 2020-02-10 DIAGNOSIS — E1129 Type 2 diabetes mellitus with other diabetic kidney complication: Secondary | ICD-10-CM | POA: Diagnosis not present

## 2020-02-10 DIAGNOSIS — Z992 Dependence on renal dialysis: Secondary | ICD-10-CM | POA: Diagnosis not present

## 2020-02-12 DIAGNOSIS — Z992 Dependence on renal dialysis: Secondary | ICD-10-CM | POA: Diagnosis not present

## 2020-02-12 DIAGNOSIS — D509 Iron deficiency anemia, unspecified: Secondary | ICD-10-CM | POA: Diagnosis not present

## 2020-02-12 DIAGNOSIS — D631 Anemia in chronic kidney disease: Secondary | ICD-10-CM | POA: Diagnosis not present

## 2020-02-12 DIAGNOSIS — N186 End stage renal disease: Secondary | ICD-10-CM | POA: Diagnosis not present

## 2020-02-12 DIAGNOSIS — E876 Hypokalemia: Secondary | ICD-10-CM | POA: Diagnosis not present

## 2020-02-12 DIAGNOSIS — N2581 Secondary hyperparathyroidism of renal origin: Secondary | ICD-10-CM | POA: Diagnosis not present

## 2020-02-22 NOTE — Progress Notes (Deleted)
Phone [phone] In person visit   Subjective:   Rodney Farley is a 86 y.o. year old very pleasant male patient who presents for/with See problem oriented charting No chief complaint on file.   This visit occurred during the SARS-CoV-2 public health emergency.  Safety protocols were in place, including screening questions prior to the visit, additional usage of staff PPE, and extensive cleaning of exam room while observing appropriate contact time as indicated for disinfecting solutions.   Past Medical History-  Patient Active Problem List   Diagnosis Date Noted  . Coagulation defect, unspecified ([place]) 05/11/2019  . Encounter for immunization 04/25/2019  . Mild protein-calorie malnutrition ([place]) 04/19/2019  . Thrombocytopenia ([place]) 04/13/2019  . Hypokalemia 04/12/2019  . Anaphylactic shock, unspecified, initial encounter 04/10/2019  . Anemia in chronic kidney disease 04/10/2019  . Complication of vascular dialysis catheter 04/10/2019  . Iron deficiency anemia, unspecified 04/10/2019  . Secondary hyperparathyroidism of renal origin ([place]) 04/10/2019  . Hyperkalemia, diminished renal excretion 04/01/2019  . Hyperlipidemia associated with type 2 diabetes mellitus ([place]) 04/13/2018  . S/P cholecystectomy 10/13/2017  . Diarrhea 05/11/2017  . Ruptured appendicitis 03/26/2017  . Marital stress 02/18/2017  . Trigger finger 06/10/2016  . BPH associated with nocturia 04/27/2016  . Diabetic peripheral neuropathy ([place])   . COPD (chronic obstructive pulmonary disease) ([place])   . Bell's palsy   . Gout 06/26/2015  . Chronic systolic CHF (congestive heart failure) ([place]) 04/08/2015  . Diverticulitis 04/08/2015  . DOE (dyspnea on exertion), due to significant CAD 03/28/2015  . ESRD on hemodialysis ([place]) 03/04/2015  . Coronary artery disease involving native coronary artery of native heart with angina pectoris ([place]) 09/13/2014  . GERD (gastroesophageal reflux disease) 08/07/2012  .  Depression, major, recurrent, moderate ([place]) 08/05/2012  . Sleep apnea 08/05/2012  . Controlled type 2 diabetes mellitus with chronic kidney disease on chronic dialysis ([place]) 08/05/2012  . Hypertension associated with diabetes ([place]) 08/05/2012  . Ischemic cardiomyopathy 11/03/2011  . Atrioventricular block, complete ([place])   . Obesity   . Pacemaker     Medications- reviewed and updated Current Outpatient Medications  Medication Sig Dispense Refill  . aspirin EC 81 MG tablet Take 81 mg by mouth daily.    [name] atorvastatin (LIPITOR) 40 MG tablet Take 1 tablet by mouth once daily 90 tablet 3  . Carboxymethylcellul-Glycerin (LUBRICATING EYE DROPS OP) Place 1 drop into both eyes 3 (three) times daily as needed (dry eyes).    . carvedilol (COREG) 3.125 MG tablet Take 1 tablet (3.125 mg total) by mouth 2 (two) times daily with a meal. Hold on mornings you have dialysis and the night before 180 tablet 3  . ciclopirox (PENLAC) 8 % solution     . erythromycin ophthalmic ointment Place 1 application into the left eye at bedtime.    [name] glucose blood (FREESTYLE TEST STRIPS) test strip Use to check blood sugar daily 100 each 3  . glucose monitoring kit (FREESTYLE) monitoring kit USE TO MONITOR BLOOD GLUCOSE AS DIRECTED 1 each 1  . lansoprazole (PREVACID) 15 MG capsule TAKE 1 CAPSULE BY MOUTH ONCE DAILY AT  12  NOON 90 capsule 0  . mirtazapine (REMERON) 7.5 MG tablet Take 0.5-1 tablets (3.75-7.5 mg total) by mouth at bedtime. 30 tablet 5  . nitroGLYCERIN (NITROSTAT) 0.4 MG SL tablet Place 1 tablet (0.4 mg total) under the tongue every 5 (five) minutes as needed for chest pain. 2 doses before calling for emergent help 25 tablet 3  . oxymetazoline (  AFRIN) 0.05 % nasal spray Place 1 spray into both nostrils 2 (two) times daily as needed for congestion.    . saccharomyces boulardii (FLORASTOR) 250 MG capsule Take 1 capsule (250 mg total) by mouth 2 (two) times daily. 30 capsule 0  . sertraline (ZOLOFT) 100 MG  tablet Take 1.5 tablets (150 mg total) by mouth daily. 135 tablet 3  . sevelamer carbonate (RENVELA) 800 MG tablet Take 1,600 mg by mouth 3 (three) times daily.    . SPS 15 GM/60ML suspension     . traMADol (ULTRAM) 50 MG tablet Take 1 tablet (50 mg total) by mouth every 12 (twelve) hours as needed for moderate pain or severe pain. 12 tablet 0  . triamcinolone cream (KENALOG) 0.1 %     . vitamin B-12 (CYANOCOBALAMIN) 1000 MCG tablet Take 1,000 mcg by mouth daily.     No current facility-administered medications for this visit.     Objective:  There were no vitals taken for this visit. Gen: NAD, resting comfortably CV: RRR no murmurs rubs or gallops Lungs: CTAB no crackles, wheeze, rhonchi Abdomen: soft/nontender/nondistended/normal bowel sounds. No rebound or guarding.  Ext: no edema Skin: warm, dry Neuro: grossly normal, moves all extremities  ***    Assessment and Plan  *** #End-stage renal disease/secondary hyperparathyroidism S:***Patient on dialysis on MWF at ***.  Also compliant with Renvela to lower phosphorus. A/P: ***     #hyperlipidemia/CAD S: Medication:***Atorvastatin 40 mg, aspirin 81 mg  A/P: ***  # Diabetes S: Medication:*** currently diet controlled. Has required insulin at times in past.  CBGs- *** Exercise and diet- ***  A/P: ***  #hypertension S: medication: ***Carvedilol 3.125 mg twice daily (down from 12.5 mg BID).  Hold on mornings of dialysis as well as evening before dialysis Home readings #s: *** A/P: ***  # Depression S: Medication:***Zoloft 150 mg.  In the past has tried Remeron half to 1 tablet at bedtime. -We placed a TH in referral last visit to see if they can help with financial stressors. -Last visit attempted to add counseling***  A/P: ***   #GERD S:***Controlled with lansoprazole/Prevacid 15 mg daily A/P: ***      Gabapentin *** also need to review problem list for specC *** HF issues  No problem-specific Assessment & Plan  notes found for this encounter.   Recommended follow up: ***No follow-ups on file. Future Appointments  Date Time Provider [place]  02/26/2020 11:20 AM [name], MD [facility]  02/27/2020 10:00 AM Evelina Bucy, DPM [facility]  03/19/2020  8:30 AM [facility] DEVICE REMOTES [facility]  05/23/2020  9:40 AM [name], Heraclio M, MD [facility]  06/18/2020  8:30 AM [facility] DEVICE REMOTES [facility]    Lab/Order associations: No diagnosis found.  No orders of the defined types were placed in this encounter.   Time Spent: *** minutes of total time (11:12 AM***- 11:12 AM***) was spent on the date of the encounter performing the following actions: chart review prior to seeing the patient, obtaining history, performing a medically necessary exam, counseling on the treatment plan, placing orders, and documenting in our EHR.   Return precautions advised.  Clyde Lundborg, CMA

## 2020-02-26 ENCOUNTER — Ambulatory Visit: Payer: PPO | Admitting: Family Medicine

## 2020-02-27 ENCOUNTER — Ambulatory Visit (INDEPENDENT_AMBULATORY_CARE_PROVIDER_SITE_OTHER): Payer: PPO | Admitting: Podiatry

## 2020-02-27 DIAGNOSIS — Z5329 Procedure and treatment not carried out because of patient's decision for other reasons: Secondary | ICD-10-CM

## 2020-02-27 NOTE — Progress Notes (Signed)
No show

## 2020-03-12 DIAGNOSIS — E1129 Type 2 diabetes mellitus with other diabetic kidney complication: Secondary | ICD-10-CM | POA: Diagnosis not present

## 2020-03-12 DIAGNOSIS — N186 End stage renal disease: Secondary | ICD-10-CM | POA: Diagnosis not present

## 2020-03-12 DIAGNOSIS — Z992 Dependence on renal dialysis: Secondary | ICD-10-CM | POA: Diagnosis not present

## 2020-03-13 DIAGNOSIS — N186 End stage renal disease: Secondary | ICD-10-CM | POA: Diagnosis not present

## 2020-03-13 DIAGNOSIS — N2581 Secondary hyperparathyroidism of renal origin: Secondary | ICD-10-CM | POA: Diagnosis not present

## 2020-03-13 DIAGNOSIS — D631 Anemia in chronic kidney disease: Secondary | ICD-10-CM | POA: Diagnosis not present

## 2020-03-13 DIAGNOSIS — D509 Iron deficiency anemia, unspecified: Secondary | ICD-10-CM | POA: Diagnosis not present

## 2020-03-13 DIAGNOSIS — Z992 Dependence on renal dialysis: Secondary | ICD-10-CM | POA: Diagnosis not present

## 2020-03-13 DIAGNOSIS — E876 Hypokalemia: Secondary | ICD-10-CM | POA: Diagnosis not present

## 2020-03-19 ENCOUNTER — Ambulatory Visit (INDEPENDENT_AMBULATORY_CARE_PROVIDER_SITE_OTHER): Payer: PPO | Admitting: *Deleted

## 2020-03-19 DIAGNOSIS — I442 Atrioventricular block, complete: Secondary | ICD-10-CM | POA: Diagnosis not present

## 2020-03-19 LAB — CUP PACEART REMOTE DEVICE CHECK
Battery Remaining Longevity: 15 mo
Battery Voltage: 2.89 V
Brady Statistic AP VP Percent: 74.51 %
Brady Statistic AP VS Percent: 0.04 %
Brady Statistic AS VP Percent: 25.39 %
Brady Statistic AS VS Percent: 0.06 %
Brady Statistic RA Percent Paced: 71.78 %
Brady Statistic RV Percent Paced: 99.76 %
Date Time Interrogation Session: 20210907012404
HighPow Impedance: 74 Ohm
Implantable Lead Implant Date: 20100112
Implantable Lead Implant Date: 20170313
Implantable Lead Implant Date: 20170313
Implantable Lead Location: 753858
Implantable Lead Location: 753859
Implantable Lead Location: 753860
Implantable Lead Model: 4598
Implantable Lead Model: 5076
Implantable Pulse Generator Implant Date: 20170313
Lead Channel Impedance Value: 361 Ohm
Lead Channel Impedance Value: 399 Ohm
Lead Channel Impedance Value: 418 Ohm
Lead Channel Impedance Value: 456 Ohm
Lead Channel Impedance Value: 475 Ohm
Lead Channel Impedance Value: 475 Ohm
Lead Channel Impedance Value: 513 Ohm
Lead Channel Impedance Value: 589 Ohm
Lead Channel Impedance Value: 760 Ohm
Lead Channel Impedance Value: 779 Ohm
Lead Channel Impedance Value: 817 Ohm
Lead Channel Impedance Value: 817 Ohm
Lead Channel Impedance Value: 836 Ohm
Lead Channel Pacing Threshold Amplitude: 0.5 V
Lead Channel Pacing Threshold Amplitude: 1.375 V
Lead Channel Pacing Threshold Amplitude: 1.5 V
Lead Channel Pacing Threshold Pulse Width: 0.4 ms
Lead Channel Pacing Threshold Pulse Width: 0.4 ms
Lead Channel Pacing Threshold Pulse Width: 0.4 ms
Lead Channel Sensing Intrinsic Amplitude: 1 mV
Lead Channel Sensing Intrinsic Amplitude: 1 mV
Lead Channel Sensing Intrinsic Amplitude: 12.375 mV
Lead Channel Sensing Intrinsic Amplitude: 12.375 mV
Lead Channel Setting Pacing Amplitude: 2.5 V
Lead Channel Setting Pacing Amplitude: 3 V
Lead Channel Setting Pacing Amplitude: 3.25 V
Lead Channel Setting Pacing Pulse Width: 0.4 ms
Lead Channel Setting Pacing Pulse Width: 0.4 ms
Lead Channel Setting Sensing Sensitivity: 0.3 mV

## 2020-03-21 NOTE — Progress Notes (Signed)
Remote ICD transmission.   

## 2020-04-11 DIAGNOSIS — N186 End stage renal disease: Secondary | ICD-10-CM | POA: Diagnosis not present

## 2020-04-11 DIAGNOSIS — Z992 Dependence on renal dialysis: Secondary | ICD-10-CM | POA: Diagnosis not present

## 2020-04-11 DIAGNOSIS — E1129 Type 2 diabetes mellitus with other diabetic kidney complication: Secondary | ICD-10-CM | POA: Diagnosis not present

## 2020-04-12 DIAGNOSIS — E1129 Type 2 diabetes mellitus with other diabetic kidney complication: Secondary | ICD-10-CM | POA: Diagnosis not present

## 2020-04-12 DIAGNOSIS — D631 Anemia in chronic kidney disease: Secondary | ICD-10-CM | POA: Diagnosis not present

## 2020-04-12 DIAGNOSIS — E876 Hypokalemia: Secondary | ICD-10-CM | POA: Diagnosis not present

## 2020-04-12 DIAGNOSIS — D509 Iron deficiency anemia, unspecified: Secondary | ICD-10-CM | POA: Diagnosis not present

## 2020-04-12 DIAGNOSIS — N2581 Secondary hyperparathyroidism of renal origin: Secondary | ICD-10-CM | POA: Diagnosis not present

## 2020-04-12 DIAGNOSIS — Z992 Dependence on renal dialysis: Secondary | ICD-10-CM | POA: Diagnosis not present

## 2020-04-12 DIAGNOSIS — N186 End stage renal disease: Secondary | ICD-10-CM | POA: Diagnosis not present

## 2020-04-25 ENCOUNTER — Telehealth: Payer: Self-pay

## 2020-04-25 NOTE — Telephone Encounter (Signed)
Volunteer check in call made for palliative care: No answer 

## 2020-04-30 DIAGNOSIS — H04123 Dry eye syndrome of bilateral lacrimal glands: Secondary | ICD-10-CM | POA: Diagnosis not present

## 2020-04-30 DIAGNOSIS — H524 Presbyopia: Secondary | ICD-10-CM | POA: Diagnosis not present

## 2020-04-30 DIAGNOSIS — Z961 Presence of intraocular lens: Secondary | ICD-10-CM | POA: Diagnosis not present

## 2020-04-30 DIAGNOSIS — E113391 Type 2 diabetes mellitus with moderate nonproliferative diabetic retinopathy without macular edema, right eye: Secondary | ICD-10-CM | POA: Diagnosis not present

## 2020-04-30 DIAGNOSIS — H00024 Hordeolum internum left upper eyelid: Secondary | ICD-10-CM | POA: Diagnosis not present

## 2020-04-30 DIAGNOSIS — H35372 Puckering of macula, left eye: Secondary | ICD-10-CM | POA: Diagnosis not present

## 2020-04-30 DIAGNOSIS — H16212 Exposure keratoconjunctivitis, left eye: Secondary | ICD-10-CM | POA: Diagnosis not present

## 2020-04-30 DIAGNOSIS — H52201 Unspecified astigmatism, right eye: Secondary | ICD-10-CM | POA: Diagnosis not present

## 2020-04-30 DIAGNOSIS — Z794 Long term (current) use of insulin: Secondary | ICD-10-CM | POA: Diagnosis not present

## 2020-04-30 DIAGNOSIS — E113492 Type 2 diabetes mellitus with severe nonproliferative diabetic retinopathy without macular edema, left eye: Secondary | ICD-10-CM | POA: Diagnosis not present

## 2020-04-30 LAB — HM DIABETES EYE EXAM

## 2020-05-07 ENCOUNTER — Other Ambulatory Visit: Payer: Self-pay | Admitting: Podiatry

## 2020-05-07 ENCOUNTER — Other Ambulatory Visit: Payer: Self-pay

## 2020-05-07 ENCOUNTER — Encounter: Payer: Self-pay | Admitting: Podiatry

## 2020-05-07 ENCOUNTER — Ambulatory Visit (INDEPENDENT_AMBULATORY_CARE_PROVIDER_SITE_OTHER): Payer: PPO | Admitting: Podiatry

## 2020-05-07 ENCOUNTER — Other Ambulatory Visit: Payer: Self-pay | Admitting: Family Medicine

## 2020-05-07 DIAGNOSIS — N186 End stage renal disease: Secondary | ICD-10-CM

## 2020-05-07 DIAGNOSIS — E11621 Type 2 diabetes mellitus with foot ulcer: Secondary | ICD-10-CM

## 2020-05-07 DIAGNOSIS — E1122 Type 2 diabetes mellitus with diabetic chronic kidney disease: Secondary | ICD-10-CM

## 2020-05-07 DIAGNOSIS — B351 Tinea unguium: Secondary | ICD-10-CM | POA: Diagnosis not present

## 2020-05-07 DIAGNOSIS — L97412 Non-pressure chronic ulcer of right heel and midfoot with fat layer exposed: Secondary | ICD-10-CM | POA: Diagnosis not present

## 2020-05-07 MED ORDER — CLINDAMYCIN HCL 300 MG PO CAPS: 300.0000 mg | ORAL_CAPSULE | Freq: Two times a day (BID) | ORAL | 0 refills | Status: DC

## 2020-05-07 MED ORDER — HYDROCODONE-ACETAMINOPHEN 5-325 MG PO TABS: 1.0000 | ORAL_TABLET | Freq: Four times a day (QID) | ORAL | 0 refills | Status: DC | PRN

## 2020-05-07 MED ORDER — CIPROFLOXACIN HCL 500 MG PO TABS: 500.0000 mg | ORAL_TABLET | Freq: Every day | ORAL | 0 refills | Status: DC

## 2020-05-07 NOTE — Progress Notes (Addendum)
Subjective:  Patient ID: Rodney Farley, male    DOB: January 27, 1932,  MRN: [id number]  86 y.o. male presents with at risk foot care with h/o NIDDM with ESRD on hemodialysis and painful callus(es) right foot and painful thick toenails that are difficult to trim. Painful toenails interfere with ambulation. Aggravating factors include wearing enclosed shoe gear. Pain is relieved with periodic professional debridement. Painful calluses are aggravated when weightbearing with and without shoegear. Pain is relieved with periodic professional debridement.   He saw Dr. March Rummage several months ago. Today he states right foot is painful to bear weight. He denies any fever, chills, night sweats, nausea or vomiting. He does have numbness and tingling of both feet.  He has made no attempts to treat this area.     PCP: [name], MD and last visit was:   Review of Systems: Negative except as noted in the HPI.  Past Medical History:  Diagnosis Date   AICD (automatic cardioverter/defibrillator) present    AKI (acute kidney injury) ([place]) 03/27/2017   Anemia    Anginal pain ([place])    Anxiety    Arm pain 05/08/2015   LEFT ARM   Arthritis    Atrioventricular block, complete ([place])    a. 2010 s/p pacemaker.   Bell's palsy    left side. after shingles episode   Bilateral renal cysts 07/23/2017   Simple and hemorrhagic noted on CT ab/pelvis    BPH associated with nocturia    Cardiomegaly    Chronic systolic CHF (congestive heart failure) ([place])    EF normalized by Echo 2019   CKD (chronic kidney disease), stage IV (HCC)    COPD (chronic obstructive pulmonary disease) ([place])    Severe   Coronary artery disease    a. s/p MI in 1994/1995 while in [place] s/p questionable PCI. 03/2015: progression of disease, for staged PCI.   Depression    Diabetic peripheral neuropathy (HCC)    Diarrhea    Diverticulitis    DOE (dyspnea on exertion) 03/28/2015   Full dentures    Gallstones    GERD  (gastroesophageal reflux disease)    Gout    Hard of hearing    B/L   Heart murmur    History of chronic pancreatitis 07/23/2017   noted on CT abd/pelvis   History of shingles    Hypercholesterolemia    Hypertension    Ischemic cardiomyopathy    MI (myocardial infarction) ([place]) 1994; 1995   Neuropathy    IN LOWER EXTREMITIES   Nosebleed 10/06/2017   for 2 months most recent 10/06/2017   Obesity    Pacemaker    medtronic>>> MDT ICD 09/23/15   Ruptured appendicitis    Sleep apnea    "sleeps w/humidifyer when he panics and gets short of breath" (04/08/2015)   TIA (transient ischemic attack) X 3   Trigger middle finger of left hand    Type II diabetes mellitus ([place])    Wears glasses    Wears hearing aid    Past Surgical History:  Procedure Laterality Date   APPENDECTOMY     AV FISTULA PLACEMENT Right 02/07/2019   Procedure: ARTERIOVENOUS (AV) FISTULA CREATION RIGHT UPPER ARM;  Surgeon: Waynetta Sandy, MD;  Location: [facility];  Service: Vascular;  Laterality: Right;   CARDIAC CATHETERIZATION N/A 03/29/2015   Procedure: Right/Left Heart Cath and Coronary Angiography;  Surgeon: Tylerjames M [name], MD;  Location: [facility];  Service: Cardiovascular;  Laterality: N/A;   CARDIAC  CATHETERIZATION  1995   "after my MI; put me on heart RX after cath"   CARDIAC CATHETERIZATION N/A 04/09/2015   Procedure: Coronary Stent Intervention;  Surgeon: Mccormick M [name], MD;  Location: [facility];  Service: Cardiovascular;  Laterality: N/A;   CATARACT EXTRACTION W/ INTRAOCULAR LENS  IMPLANT, BILATERAL     CHOLECYSTECTOMY N/A 10/13/2017   Procedure: LAPAROSCOPIC CHOLECYSTECTOMY WITH LYSIS OF ADHESIONS;  Surgeon: Ileana Roup, MD;  Location: [facility];  Service: General;  Laterality: N/A;   COLONOSCOPY     DENTAL SURGERY     EP IMPLANTABLE DEVICE N/A 09/23/2015   MDT CRT-D, Dr. Caryl Comes   HIATAL HERNIA REPAIR  1977   ILEOCECETOMY N/A 03/27/2017   Procedure: ILEOCECECTOMY;  Surgeon:  Ileana Roup, MD;  Location: [facility];  Service: General;  Laterality: N/A;   INSERT / REPLACE / REMOVE PACEMAKER  07/2008   Complete heart block status post DDD with good function   IR FLUORO GUIDE CV LINE RIGHT  04/03/2019   IR US GUIDE VASC ACCESS RIGHT  04/03/2019   LAPAROTOMY N/A 03/27/2017   Procedure: EXPLORATORY LAPAROTOMY;  Surgeon: Ileana Roup, MD;  Location: [facility];  Service: General;  Laterality: N/A;   TONSILLECTOMY     UPPER GI ENDOSCOPY     Patient Active Problem List   Diagnosis Date Noted   Dependence on renal dialysis ([place]) 11/13/2019   Coagulation defect, unspecified ([place]) 05/11/2019   Encounter for immunization 04/25/2019   Mild protein-calorie malnutrition ([place]) 04/19/2019   Thrombocytopenia ([place]) 04/13/2019   Hypokalemia 04/12/2019   Anaphylactic shock, unspecified, initial encounter 04/10/2019   Anemia in chronic kidney disease 70/07/7492   Complication of vascular dialysis catheter 04/10/2019   Iron deficiency anemia, unspecified 04/10/2019   Secondary hyperparathyroidism of renal origin ([place]) 04/10/2019   Hyperkalemia, diminished renal excretion 04/01/2019   Hyperlipidemia associated with type 2 diabetes mellitus ([place]) 04/13/2018   S/P cholecystectomy 10/13/2017   Diarrhea 05/11/2017   Ruptured appendicitis 03/26/2017   Marital stress 02/18/2017   Trigger finger 06/10/2016   BPH associated with nocturia 04/27/2016   Diabetic peripheral neuropathy (HCC)    COPD (chronic obstructive pulmonary disease) ([place])    Bell's palsy    Gout 49/67/5916   Chronic systolic CHF (congestive heart failure) ([place]) 04/08/2015   Diverticulitis 04/08/2015   DOE (dyspnea on exertion), due to significant CAD 03/28/2015   ESRD on hemodialysis ([place]) 03/04/2015   CKD (chronic kidney disease) stage 4, GFR 15-29 ml/min (HCC) 03/04/2015   Coronary artery disease involving native coronary artery of native heart with angina pectoris ([place]) 09/13/2014   GERD (gastroesophageal  reflux disease) 08/07/2012   Depression, major, recurrent, moderate ([place]) 08/05/2012   Sleep apnea 08/05/2012   Controlled type 2 diabetes mellitus with chronic kidney disease on chronic dialysis ([place]) 08/05/2012   Hypertension associated with diabetes ([place]) 08/05/2012   Diabetes type 2, uncontrolled ([place]) 08/05/2012   Ischemic cardiomyopathy 11/03/2011   Atrioventricular block, complete (HCC)    Obesity    Pacemaker     Current Outpatient Medications:    aspirin EC 81 MG tablet, Take 81 mg by mouth daily., Disp: , Rfl:    atorvastatin (LIPITOR) 40 MG tablet, Take 1 tablet by mouth once daily, Disp: 90 tablet, Rfl: 3   Carboxymethylcellul-Glycerin (LUBRICATING EYE DROPS OP), Place 1 drop into both eyes 3 (three) times daily as needed (dry eyes)., Disp: , Rfl:    carvedilol (COREG) 3.125 MG tablet, Take 1 tablet (3.125 mg  total) by mouth 2 (two) times daily with a meal. Hold on mornings you have dialysis and the night before, Disp: 180 tablet, Rfl: 3   ciclopirox (PENLAC) 8 % solution, , Disp: , Rfl:    erythromycin ophthalmic ointment, Place 1 application into the left eye at bedtime., Disp: , Rfl:    glucose blood (FREESTYLE TEST STRIPS) test strip, Use to check blood sugar daily, Disp: 100 each, Rfl: 3   glucose monitoring kit (FREESTYLE) monitoring kit, USE TO MONITOR BLOOD GLUCOSE AS DIRECTED, Disp: 1 each, Rfl: 1   iron sucrose in sodium chloride 0.9 % 100 mL, Iron Sucrose (Venofer), Disp: , Rfl:    Methoxy PEG-Epoetin Beta (MIRCERA IJ), Mircera, Disp: , Rfl:    midodrine (PROAMATINE) 10 MG tablet, Take by mouth., Disp: , Rfl:    mirtazapine (REMERON) 7.5 MG tablet, Take 0.5-1 tablets (3.75-7.5 mg total) by mouth at bedtime., Disp: 30 tablet, Rfl: 5   nitroGLYCERIN (NITROSTAT) 0.4 MG SL tablet, Place 1 tablet (0.4 mg total) under the tongue every 5 (five) minutes as needed for chest pain. 2 doses before calling for emergent help, Disp: 25 tablet, Rfl: 3   oxymetazoline (AFRIN) 0.05 %  nasal spray, Place 1 spray into both nostrils 2 (two) times daily as needed for congestion., Disp: , Rfl:    saccharomyces boulardii (FLORASTOR) 250 MG capsule, Take 1 capsule (250 mg total) by mouth 2 (two) times daily., Disp: 30 capsule, Rfl: 0   sertraline (ZOLOFT) 100 MG tablet, Take 1.5 tablets (150 mg total) by mouth daily., Disp: 135 tablet, Rfl: 3   sevelamer carbonate (RENVELA) 800 MG tablet, Take 1,600 mg by mouth 3 (three) times daily., Disp: , Rfl:    sevelamer carbonate (RENVELA) 800 MG tablet, Take by mouth., Disp: , Rfl:    SPS 15 GM/60ML suspension, , Disp: , Rfl:    traMADol (ULTRAM) 50 MG tablet, Take 1 tablet (50 mg total) by mouth every 12 (twelve) hours as needed for moderate pain or severe pain., Disp: 12 tablet, Rfl: 0   triamcinolone cream (KENALOG) 0.1 %, , Disp: , Rfl:    vitamin B-12 (CYANOCOBALAMIN) 1000 MCG tablet, Take 1,000 mcg by mouth daily., Disp: , Rfl:    ciprofloxacin (CIPRO) 500 MG tablet, Take 1 tablet (500 mg total) by mouth daily with breakfast., Disp: 14 tablet, Rfl: 0   clindamycin (CLEOCIN) 300 MG capsule, Take 1 capsule (300 mg total) by mouth 2 (two) times daily., Disp: 14 capsule, Rfl: 0   HYDROcodone-acetaminophen (NORCO) 5-325 MG tablet, Take 1 tablet by mouth every 6 (six) hours as needed for moderate pain., Disp: 30 tablet, Rfl: 0   lansoprazole (PREVACID) 15 MG capsule, TAKE 1 CAPSULE BY MOUTH ONCE DAILY AT  12  NOON, Disp: 90 capsule, Rfl: 0 Allergies  Allergen Reactions   Bee Venom Anaphylaxis   Lyrica [Pregabalin] Other (See Comments)    hallucinations   Prednisone Other (See Comments)    hallucinations   Zocor [Simvastatin] Nausea Only and Other (See Comments)    Headache with brand name only.  Can take the generic.   Social History   Tobacco Use  Smoking Status Former Smoker   Packs/day: 1.50   Years: 54.00   Pack years: 81.00   Types: Cigarettes   Quit date: 07/18/2007   Years since quitting: 12.8  Smokeless Tobacco Never Used      Objective:  There were no vitals filed for this visit. Constitutional Patient is a pleasant 86 y.o. Caucasian  male in NAD. AAO x 3.  Vascular Capillary refill time to digits immediate b/l. Faintly palpable DP pulse(s) b/l lower extremities. Faintly palpable PT pulse(s) b/l lower extremities. Pedal hair absent. No pain with calf compression b/l. No cyanosis or clubbing noted.  Neurologic Normal speech. Pt has subjective symptoms of neuropathy. Protective sensation intact 5/5 intact bilaterally with 10g monofilament b/l. Vibratory sensation intact b/l.  Dermatologic Pedal skin with normal turgor, texture and tone bilaterally.  Right first metatarsophalangeal joint ulceration with purulence upon debridement and surrounding warmth and erythema. No interdigital macerations bilaterally. Toenails 1-5 b/l elongated, discolored, dystrophic, thickened, crumbly with subungual debris and tenderness to dorsal palpation.  Orthopedic: Normal muscle strength 5/5 to all lower extremity muscle groups bilaterally. No pain crepitus or joint limitation noted with ROM b/l. No gross bony deformities bilaterally.   Hemoglobin A1C 10/27/2019  HGBA1C 6.7  Some recent data might be hidden   Assessment:   1. Onychomycosis   2. Diabetic ulcer of right midfoot associated with type 2 diabetes mellitus, with fat layer exposed ([place])   3. DM type 2 causing ESRD ([place])    Plan:  Patient was evaluated and treated and all questions answered.  Onychomycosis with pain -Nails palliatively debridement as below. -Educated on self-care  Procedure: Nail Debridement Rationale: Pain Type of Debridement: manual, sharp debridement. Instrumentation: Nail nipper, rotary burr. Number of Nails: 10  -Examined patient. -Toenails 1-5 b/l were debrided in length and girth with sterile nail nippers and dremel without iatrogenic bleeding.  -Patient to continue soft, supportive shoe gear daily. -Patient/POA to call should there be  question/concern in the interim.  Return in about 3 months (around 08/07/2020) with Dr. Elisha Ponder for at-risk foot care.  Addendum by Dr. March Rummage -Right 1st MPJ ulceration noted with purulence about the wound. This was collected for culture. Wound was excisionally debrided -Rx clindamycin -F/u in 2 weeks for recheck  Procedure: Excisional Debridement of Wound Indication: Removal of non-viable soft tissue from the wound to promote healing.  Anesthesia: none Pre-Debridement Wound Measurements: overlying blister  Post-Debridement Wound Measurements: 0.5 cm x 0.5 cm x 0.2 cm  Type of Debridement: Sharp Excisional Tissue Removed: Non-viable soft tissue Instrumentation: 15 blade and tissue nipper Depth of Debridement: subcutaneous tissue. Technique: Sharp excisional debridement to bleeding, viable wound base.  Dressing: Dry, sterile, compression dressing. Disposition: Patient tolerated procedure well. Patient to return in 1 week for follow-up.  Marzetta Board, DPM

## 2020-05-12 DIAGNOSIS — E1129 Type 2 diabetes mellitus with other diabetic kidney complication: Secondary | ICD-10-CM | POA: Diagnosis not present

## 2020-05-12 DIAGNOSIS — N186 End stage renal disease: Secondary | ICD-10-CM | POA: Diagnosis not present

## 2020-05-12 DIAGNOSIS — Z992 Dependence on renal dialysis: Secondary | ICD-10-CM | POA: Diagnosis not present

## 2020-05-13 ENCOUNTER — Other Ambulatory Visit: Payer: Self-pay

## 2020-05-13 DIAGNOSIS — Z992 Dependence on renal dialysis: Secondary | ICD-10-CM | POA: Diagnosis not present

## 2020-05-13 DIAGNOSIS — E876 Hypokalemia: Secondary | ICD-10-CM | POA: Diagnosis not present

## 2020-05-13 DIAGNOSIS — D509 Iron deficiency anemia, unspecified: Secondary | ICD-10-CM | POA: Diagnosis not present

## 2020-05-13 DIAGNOSIS — D631 Anemia in chronic kidney disease: Secondary | ICD-10-CM | POA: Diagnosis not present

## 2020-05-13 DIAGNOSIS — N2581 Secondary hyperparathyroidism of renal origin: Secondary | ICD-10-CM | POA: Diagnosis not present

## 2020-05-13 DIAGNOSIS — N186 End stage renal disease: Secondary | ICD-10-CM | POA: Diagnosis not present

## 2020-05-14 ENCOUNTER — Ambulatory Visit (HOSPITAL_COMMUNITY)
Admission: RE | Admit: 2020-05-14 | Discharge: 2020-05-14 | Disposition: A | Discharge status: Home / Self Care | Payer: PPO | Source: Ambulatory Visit | Attending: Vascular Surgery | Admitting: Vascular Surgery

## 2020-05-14 ENCOUNTER — Other Ambulatory Visit: Payer: Self-pay

## 2020-05-14 ENCOUNTER — Ambulatory Visit: Payer: PPO | Admitting: Vascular Surgery

## 2020-05-14 ENCOUNTER — Ambulatory Visit: Payer: PPO | Admitting: Podiatry

## 2020-05-14 VITALS — BP 152/65 | HR 73 | Temp 98.6°F | Ht 68.0 in | Wt 212.0 lb

## 2020-05-14 DIAGNOSIS — Z992 Dependence on renal dialysis: Secondary | ICD-10-CM | POA: Insufficient documentation

## 2020-05-14 DIAGNOSIS — E11621 Type 2 diabetes mellitus with foot ulcer: Secondary | ICD-10-CM

## 2020-05-14 DIAGNOSIS — N186 End stage renal disease: Secondary | ICD-10-CM | POA: Diagnosis not present

## 2020-05-14 DIAGNOSIS — G894 Chronic pain syndrome: Secondary | ICD-10-CM

## 2020-05-14 DIAGNOSIS — L97412 Non-pressure chronic ulcer of right heel and midfoot with fat layer exposed: Secondary | ICD-10-CM

## 2020-05-14 NOTE — Progress Notes (Signed)
Office Note     CC:  follow up Requesting Provider:  [name], MD  HPI: Rodney Farley is a 86 y.o. (02-13-1932) male who presents for follow up on right brachiocephalic AV fistula. It was initially created in July 2020 by Dr. Donzetta Matters. He has been dialyzing MWF without issues.He was last seen on 08/24/19 at which time he was having some pain and numbness in his right hand mainly when connected to the HD machine. These resolved when disconnected. Now over the last two weeks he has had episodes of extreme pain in right forearm and into right hand that occur at HD and also after he is disconnected throughout rest of day and evening following HD. He gets cramps in his right hand and coldness. He says there have been several nights where he almost has gone to the ER because of the unbearable pain. He additionally has been getting pain in both legs following dialysis as well. He otherwise denies any claudication symptoms or rest pain. He denies any non healing wounds on his right hand or lower extremities. He is voicing his concern that he is no longer able to tolerate the pain he is having in his right arm and hand  Of note he has left sided AICD. He is right hand dominant but due to the AICD he had right AV fistula placed  The pt is on a statin for cholesterol management.  The pt is on a daily aspirin.   Other AC: none The pt is on BB for hypertension.   The pt is diabetic.   Tobacco hx: Former, 2009  Interval history: Patient returns reporting worsening pain in the right hand.  He says the pain bothers him most nights.  I reviewed operative options to treat his pain including ligation of the AV fistula and work-up for distal revascularization with interval ligation.  He is not interested in any surgical options.  He seems most interested in medical therapy.  I explained that there are no good medical options short of oral analgesia.  He is understanding.  Past Medical History:    Diagnosis Date  . AICD (automatic cardioverter/defibrillator) present   . AKI (acute kidney injury) ([place]) 03/27/2017  . Anemia   . Anginal pain ([place])   . Anxiety   . Arm pain 05/08/2015   LEFT ARM  . Arthritis   . Atrioventricular block, complete ([place])    a. 2010 s/p pacemaker.  . Bell's palsy    left side. after shingles episode  . Bilateral renal cysts 07/23/2017   Simple and hemorrhagic noted on CT ab/pelvis   . BPH associated with nocturia   . Cardiomegaly   . Chronic systolic CHF (congestive heart failure) ([place])    EF normalized by Echo 2019  . CKD (chronic kidney disease), stage IV ([place])   . COPD (chronic obstructive pulmonary disease) ([facility])    Severe  . Coronary artery disease    a. s/p MI in 1994/1995 while in [place] s/p questionable PCI. 03/2015: progression of disease, for staged PCI.  [name] Depression   . Diabetic peripheral neuropathy ([place])   . Diarrhea   . Diverticulitis   . DOE (dyspnea on exertion) 03/28/2015  . Full dentures   . Gallstones   . GERD (gastroesophageal reflux disease)   . Gout   . Hard of hearing    B/L  . Heart murmur   . History of chronic pancreatitis 07/23/2017   noted on CT abd/pelvis  . History  of shingles   . Hypercholesterolemia   . Hypertension   . Ischemic cardiomyopathy   . MI (myocardial infarction) ([place]) 1994; 1995  . Neuropathy    IN LOWER EXTREMITIES  . Nosebleed 10/06/2017   for 2 months most recent 10/06/2017  . Obesity   . Pacemaker    medtronic>>> MDT ICD 09/23/15  . Ruptured appendicitis   . Sleep apnea    "sleeps w/humidifyer when he panics and gets short of breath" (04/08/2015)  . TIA (transient ischemic attack) X 3  . Trigger middle finger of left hand   . Type II diabetes mellitus ([place])   . Wears glasses   . Wears hearing aid     Past Surgical History:  Procedure Laterality Date  . APPENDECTOMY    . AV FISTULA PLACEMENT Right 02/07/2019   Procedure: ARTERIOVENOUS (AV) FISTULA CREATION RIGHT UPPER ARM;   Surgeon: Waynetta Sandy, MD;  Location: [facility];  Service: Vascular;  Laterality: Right;  . CARDIAC CATHETERIZATION N/A 03/29/2015   Procedure: Right/Left Heart Cath and Coronary Angiography;  Surgeon: Demere M [name], MD;  Location: [facility];  Service: Cardiovascular;  Laterality: N/A;  . [place]   "after my MI; put me on heart RX after cath"  . CARDIAC CATHETERIZATION N/A 04/09/2015   Procedure: Coronary Stent Intervention;  Surgeon: Davian M [name], MD;  Location: [facility];  Service: Cardiovascular;  Laterality: N/A;  . CATARACT EXTRACTION W/ INTRAOCULAR LENS  IMPLANT, BILATERAL    . CHOLECYSTECTOMY N/A 10/13/2017   Procedure: LAPAROSCOPIC CHOLECYSTECTOMY WITH LYSIS OF ADHESIONS;  Surgeon: Ileana Roup, MD;  Location: [facility];  Service: General;  Laterality: N/A;  . COLONOSCOPY    . DENTAL SURGERY    . EP IMPLANTABLE DEVICE N/A 09/23/2015   MDT CRT-D, Dr. Caryl Comes  . HIATAL HERNIA REPAIR  1977  . ILEOCECETOMY N/A 03/27/2017   Procedure: ILEOCECECTOMY;  Surgeon: Ileana Roup, MD;  Location: [facility];  Service: General;  Laterality: N/A;  . INSERT / REPLACE / REMOVE PACEMAKER  07/2008   Complete heart block status post DDD with good function  . IR FLUORO GUIDE CV LINE RIGHT  04/03/2019  . IR US GUIDE VASC ACCESS RIGHT  04/03/2019  . LAPAROTOMY N/A 03/27/2017   Procedure: EXPLORATORY LAPAROTOMY;  Surgeon: Ileana Roup, MD;  Location: [facility];  Service: General;  Laterality: N/A;  . TONSILLECTOMY    . UPPER GI ENDOSCOPY      Social History   Socioeconomic History  . Marital status: Legally Separated    Spouse name: Not on file  . Number of children: 1  . Years of education: college  . Highest education level: Not on file  Occupational History  . Occupation: Retired  Tobacco Use  . Smoking status: Former Smoker    Packs/day: 1.50    Years: 54.00    Pack years: 81.00    Types: Cigarettes    Quit date: 07/18/2007    Years  since quitting: 12.8  . Smokeless tobacco: Never Used  Vaping Use  . Vaping Use: Never used  Substance and Sexual Activity  . Alcohol use: Yes    Comment: rare  . Drug use: No  . Sexual activity: Never  Other Topics Concern  . Not on file  Social History Narrative   Married 1994 (together since 1989) 1 son, 1 stepson. 3 grandkids, 6 great grandkids      Retired from [employer]. Had 2 years  of collge.       Faith: Mormon      Here on [address] since 2004 from [place]; wife recently left and relocated to [facility]    Social Determinants of [employer] Strain:   . Difficulty of Paying Living Expenses: Not on file  Food Insecurity:   . Worried About Charity fundraiser in the Last Year: Not on file  . Ran Out of Food in the Last Year: Not on file  Transportation Needs:   . Lack of Transportation (Medical): Not on file  . Lack of Transportation (Non-Medical): Not on file  Physical Activity:   . Days of Exercise per Week: Not on file  . Minutes of Exercise per Session: Not on file  Stress:   . Feeling of Stress : Not on file  Social Connections:   . Frequency of Communication with Friends and Family: Not on file  . Frequency of Social Gatherings with Friends and Family: Not on file  . Attends Religious Services: Not on file  . Active Member of Clubs or Organizations: Not on file  . Attends Archivist Meetings: Not on file  . Marital Status: Not on file  Intimate Partner Violence:   . Fear of Current or Ex-Partner: Not on file  . Emotionally Abused: Not on file  . Physically Abused: Not on file  . Sexually Abused: Not on file   Family History  Problem Relation Age of Onset  . Stroke Mother   . Leukemia Father   . Stroke Sister   . Heart attack Brother     Current Outpatient Medications  Medication Sig Dispense Refill  . aspirin EC 81 MG tablet Take 81 mg by mouth daily.    [name] atorvastatin (LIPITOR) 40 MG tablet Take 1 tablet by  mouth once daily 90 tablet 3  . Carboxymethylcellul-Glycerin (LUBRICATING EYE DROPS OP) Place 1 drop into both eyes 3 (three) times daily as needed (dry eyes).    . carvedilol (COREG) 3.125 MG tablet Take 1 tablet (3.125 mg total) by mouth 2 (two) times daily with a meal. Hold on mornings you have dialysis and the night before 180 tablet 3  . ciclopirox (PENLAC) 8 % solution     . ciprofloxacin (CIPRO) 500 MG tablet Take 1 tablet (500 mg total) by mouth daily with breakfast. 14 tablet 0  . clindamycin (CLEOCIN) 300 MG capsule Take 1 capsule (300 mg total) by mouth 2 (two) times daily. 14 capsule 0  . erythromycin ophthalmic ointment Place 1 application into the left eye at bedtime.    [name] glucose blood (FREESTYLE TEST STRIPS) test strip Use to check blood sugar daily 100 each 3  . glucose monitoring kit (FREESTYLE) monitoring kit USE TO MONITOR BLOOD GLUCOSE AS DIRECTED 1 each 1  . HYDROcodone-acetaminophen (NORCO) 5-325 MG tablet Take 1 tablet by mouth every 6 (six) hours as needed for moderate pain. 30 tablet 0  . lansoprazole (PREVACID) 15 MG capsule TAKE 1 CAPSULE BY MOUTH ONCE DAILY AT  12  NOON 90 capsule 0  . Methoxy PEG-Epoetin Beta (MIRCERA IJ) Mircera    . midodrine (PROAMATINE) 10 MG tablet Take by mouth.    . mirtazapine (REMERON) 7.5 MG tablet Take 0.5-1 tablets (3.75-7.5 mg total) by mouth at bedtime. 30 tablet 5  . nitroGLYCERIN (NITROSTAT) 0.4 MG SL tablet Place 1 tablet (0.4 mg total) under the tongue every 5 (five) minutes as needed for chest pain. 2 doses before  calling for emergent help 25 tablet 3  . oxymetazoline (AFRIN) 0.05 % nasal spray Place 1 spray into both nostrils 2 (two) times daily as needed for congestion.    . saccharomyces boulardii (FLORASTOR) 250 MG capsule Take 1 capsule (250 mg total) by mouth 2 (two) times daily. 30 capsule 0  . sertraline (ZOLOFT) 100 MG tablet Take 1.5 tablets (150 mg total) by mouth daily. 135 tablet 3  . sevelamer carbonate (RENVELA) 800 MG  tablet Take 1,600 mg by mouth 3 (three) times daily.    . sevelamer carbonate (RENVELA) 800 MG tablet Take by mouth.    . SPS 15 GM/60ML suspension     . traMADol (ULTRAM) 50 MG tablet Take 1 tablet (50 mg total) by mouth every 12 (twelve) hours as needed for moderate pain or severe pain. 12 tablet 0  . triamcinolone cream (KENALOG) 0.1 %     . vitamin B-12 (CYANOCOBALAMIN) 1000 MCG tablet Take 1,000 mcg by mouth daily.     No current facility-administered medications for this visit.    Allergies  Allergen Reactions  . Bee Venom Anaphylaxis  . Lyrica [Pregabalin] Other (See Comments)    hallucinations  . Prednisone Other (See Comments)    hallucinations  . Zocor [Simvastatin] Nausea Only and Other (See Comments)    Headache with brand name only.  Can take the generic.     REVIEW OF SYSTEMS:  _0  denotes positive finding, _1  denotes negative finding Cardiac  Comments:  Chest pain or chest pressure:    Shortness of breath upon exertion:    Short of breath when lying flat:    Irregular heart rhythm:        Vascular    Pain in calf, thigh, or hip brought on by ambulation:    Pain in feet at night that wakes you up from your sleep:     Blood clot in your veins:    Leg swelling:         Pulmonary    Oxygen at home:    Productive cough:     Wheezing:         Neurologic    Sudden weakness in arms or legs:     Sudden numbness in arms or legs:     Sudden onset of difficulty speaking or slurred speech:    Temporary loss of vision in one eye:     Problems with dizziness:         Gastrointestinal    Blood in stool:     Vomited blood:         Genitourinary    Burning when urinating:     Blood in urine:        Psychiatric    Major depression:         Hematologic    Bleeding problems:    Problems with blood clotting too easily:        Skin    Rashes or ulcers:        Constitutional    Fever or chills:      PHYSICAL EXAMINATION:  Vitals:   05/14/20 1555  BP:  (!) 152/65  Pulse: 73  Temp: 98.6 F (37 C)  TempSrc: Skin  SpO2: 97%  Weight: 212 lb (96.2 kg)  Height: _2  (1.727 m)    General:  WDWN in NAD; vital signs documented above Gait: normal HENT: WNL, normocephalic Pulmonary: normal non-labored breathing , without Rales, rhonchi,  wheezing Cardiac: regular HR, without  Murmurs without carotid bruit Abdomen: soft, NT, no masses Skin: without rashes Vascular Exam/Pulses:  Right Left  Radial 1+ (weak) 2+ (normal)  Ulnar 1+ (weak) 2+ (normal)  Femoral 2+ (normal) 2+ (normal)  Popliteal Not palpable Not palpable  DP 1+ (weak) 1+ (weak)  PT 2+ (normal) 2+ (normal)   Extremities: without ischemic changes, without Gangrene , without cellulitis; without open wounds;  Musculoskeletal: no muscle wasting or atrophy  Neurologic: A&O X 3;  No focal weakness or paresthesias are detected Psychiatric:  The pt has Normal affect.   Non-Invasive Vascular Imaging:  12/07/19  VAS [place] Exam Findings:    +-------------+---------------------------+---------------------------+----  ----+            PPG waveform with      PPG waveform w/o     Comments             compression        compression           +-------------+---------------------------+---------------------------+----  ----+  Right Digit 2     increased                          +-------------+---------------------------+---------------------------+----  ----+          Pressure w compression   Pressure w/o compression        +-------------+---------------------------+---------------------------+----  ----+  Right Digit 2      128            65              +-------------+---------------------------+---------------------------+----  ----+   Summary:  Right 2nd digit waveform and pressure increased with AVF compression.     ASSESSMENT/PLAN:: 86 y.o. male with persistent access related hand ischemia causing ischemic rest pain in the right hand after right brachiocephalic AV fistula.   I again reviewed the operative options for the patient.  He is not interested in ligation of the AV fistula or consideration of bypass.  He is only interested in pain control.  I have referred him to pain management.  He can follow-up as needed with our clinic.  Discussed with his son [name].  Cherre Robins, MD [facility] [phone]

## 2020-05-14 NOTE — Progress Notes (Signed)
  Subjective:  Patient ID: Rodney Farley, male    DOB: March 18, 1932,  MRN: [id number]  No chief complaint on file.   86 y.o. male presents for wound care. States the wound is doing much better. Denies new issues. Almost done with his Abx. Objective:  Physical Exam: Wound Location: right 1st met Wound Measurement: healed Wound Base: epithelialized Peri-wound: Calloused Exudate: None: wound tissue dry wound without warmth, erythema, signs of acute infection Assessment:   1. Diabetic ulcer of right midfoot associated with type 2 diabetes mellitus, with fat layer exposed ([place])    Plan:  Patient was evaluated and treated and all questions answered.  Ulcer right hallux -Appears healed -Culture results not back yet. Complete abx course should not need further Abx -Transition to normal shoegear -F/u in 1 month for recheck, then will see back on regular schedule  Return in about 1 month (around 06/13/2020) for Wound Care, Left.

## 2020-05-16 ENCOUNTER — Telehealth: Payer: Self-pay

## 2020-05-16 NOTE — Telephone Encounter (Signed)
Volunteer call made on 05/01/20. No answer

## 2020-05-21 NOTE — Progress Notes (Signed)
CARDIOLOGY OFFICE NOTE  Date:  05/23/2020    Rodney Farley Date of Birth: 09/26/1931 Medical Record #[id number]  PCP:  [name], MD  Cardiologist:  Rajan [name]  MD  Chief Complaint  Patient presents with  . Follow-up    12 months.    History of Present Illness: Rodney Farley is a 86 y.o. male who is seen for follow up CAD and CHF.   He has a history of CAD (s/p MI in 1994/1995 while in [place] s/p questionable PCI, , CHB 2010 s/p pacemaker, CKD stage IV, DM, HTN, HLD, remote tobacco abuse, COPD, moderate sleep apnea, and chronic systolic CHF.    In 2016 he had progression of DOE, chest discomfort and chest pounding with severe limitation on exertion. 2D Echo 09/2014 showed EF 35-40% with +WMA, grade 1 DD, mild MR. PFTs looked remarkably good. He underwent cardiac cath on 03/28/15 and Cr was 2.14 . LE duplex negative for DVT. Cath showed severe 3V CAD - the mid LAD, second diagonal, and third OM branches appeared suitable for PCI +/- distal LCx. The other option would be CABG. After discussion concerning risk and benefits he was treated with PCI.   Patient underwent Multivessel PCI including stenting of the mid LAD, second diagonal, and third OM with DES on 04/09/15.  Distal 70% circumflex lesion  treated medically.He was started on aspirin and Plavix. Hydralazine was added for afterload reduction and better blood pressure control.   After his procedure he  had a marked improvement in his symptoms of dyspnea.  No chest pain. Breathing was much better. He had repeat Echo that still showed depressed EF 25-30% despite revascularization. He underwent upgrade of PPM to a BiV ICD device by Dr. Caryl Comes on 09/23/15. Follow up Echo in July 2017 showed normalization of EF.     He had a prolonged hospitalization from 9/14-9/25/2018 when he presented to the hospital with right lower quadrant abdominal pain and found to have a ruptured appendicitis with abscess. Gen. Surgery  was consulted and performed exploratory laparotomy with ileocecectomy on 03/27/2017. During the hospitalization, he also had acute on chronic renal insufficiency with peak creatinine of 5.75. However subsequent creatinine came down to 2.44. Followed by nephrology with reported stable renal function with GFR in 20s.    He underwent cholecystectomy on 10/13/2017.  Postoperative course was complicated by elevated troponin involving neck and shoulder pain.  Troponin peaked at the 2.14.  It was felt is likely demand ischemia associated with surgery.  Repeat echocardiogram obtained on 10/15/2017 showed EF 60 to 65%, paradoxical ventricular motion, mild MR.  Myoview obtained on the following day was intermediate risk was a large defect of severe severity present in the basal anteroseptal, mid anterior septal and apex location, this is large and a partially reversible defect concerning for mild to moderate ischemia.  Options was discussed with both the patient and his son, who preferred medical approach more than cardiac catheterization at this time.  Plavix was not started at this time due to his nosebleed and anemia. He was seen in follow up later in April with increased LE edema. Had run out of lasix for several days. Was started back on lasix 40 mg bid with compression hose and elevation of feet. Close follow up with Ren[facility] recommended. Last creatinine there in July was 4.05.  ICD check on March 21, 2019 showed 100% BiV pacing. No significant arrhythmias.   He had an AV fistula placed in July  2020. In September 2020 he presented to the hospital with generalized weakness and fall secondary to uremia. He was started on HD. Also treated for UTI.  Last pacemaker follow up in September 2021 showed normal device function. Afib x 1 lasting less than one minute.   On follow up today he is doing well. Still on HD 3 days a week. He does have some hypotension with dialysis so takes midodrine on dialysis days and doesn't take  the low dose Coreg. He denies any chest pain or SOB. No increase in edema. No palpitations. Does have some pain in right hand with dialysis and is followed by [facility]. He has chronic peripheral neuropathy.  Past Medical History:  Diagnosis Date  . AICD (automatic cardioverter/defibrillator) present   . AKI (acute kidney injury) ([place]) 03/27/2017  . Anemia   . Anginal pain ([place])   . Anxiety   . Arm pain 05/08/2015   LEFT ARM  . Arthritis   . Atrioventricular block, complete ([place])    a. 2010 s/p pacemaker.  . Bell's palsy    left side. after shingles episode  . Bilateral renal cysts 07/23/2017   Simple and hemorrhagic noted on CT ab/pelvis   . BPH associated with nocturia   . Cardiomegaly   . Chronic systolic CHF (congestive heart failure) ([place])    EF normalized by Echo 2019  . CKD (chronic kidney disease), stage IV ([place])   . COPD (chronic obstructive pulmonary disease) ([facility])    Severe  . Coronary artery disease    a. s/p MI in 1994/1995 while in [place] s/p questionable PCI. 03/2015: progression of disease, for staged PCI.  [name] Depression   . Diabetic peripheral neuropathy ([place])   . Diarrhea   . Diverticulitis   . DOE (dyspnea on exertion) 03/28/2015  . Full dentures   . Gallstones   . GERD (gastroesophageal reflux disease)   . Gout   . Hard of hearing    B/L  . Heart murmur   . History of chronic pancreatitis 07/23/2017   noted on CT abd/pelvis  . History of shingles   . Hypercholesterolemia   . Hypertension   . Ischemic cardiomyopathy   . MI (myocardial infarction) ([place]) 1994; 1995  . Neuropathy    IN LOWER EXTREMITIES  . Nosebleed 10/06/2017   for 2 months most recent 10/06/2017  . Obesity   . Pacemaker    medtronic>>> MDT ICD 09/23/15  . Ruptured appendicitis   . Sleep apnea    "sleeps w/humidifyer when he panics and gets short of breath" (04/08/2015)  . TIA (transient ischemic attack) X 3  . Trigger middle finger of left hand   . Type II diabetes mellitus ([place])   .  Wears glasses   . Wears hearing aid     Past Surgical History:  Procedure Laterality Date  . APPENDECTOMY    . AV FISTULA PLACEMENT Right 02/07/2019   Procedure: ARTERIOVENOUS (AV) FISTULA CREATION RIGHT UPPER ARM;  Surgeon: Waynetta Sandy, MD;  Location: [facility];  Service: Vascular;  Laterality: Right;  . CARDIAC CATHETERIZATION N/A 03/29/2015   Procedure: Right/Left Heart Cath and Coronary Angiography;  Surgeon: Jarrette M [name], MD;  Location: [facility];  Service: Cardiovascular;  Laterality: N/A;  . [place]   "after my MI; put me on heart RX after cath"  . CARDIAC CATHETERIZATION N/A 04/09/2015   Procedure: Coronary Stent Intervention;  Surgeon: Quintavius M [name], MD;  Location: [facility];  Service: Cardiovascular;  Laterality: N/A;  . CATARACT EXTRACTION W/ INTRAOCULAR LENS  IMPLANT, BILATERAL    . CHOLECYSTECTOMY N/A 10/13/2017   Procedure: LAPAROSCOPIC CHOLECYSTECTOMY WITH LYSIS OF ADHESIONS;  Surgeon: Ileana Roup, MD;  Location: [facility];  Service: General;  Laterality: N/A;  . COLONOSCOPY    . DENTAL SURGERY    . EP IMPLANTABLE DEVICE N/A 09/23/2015   MDT CRT-D, Dr. Caryl Comes  . HIATAL HERNIA REPAIR  1977  . ILEOCECETOMY N/A 03/27/2017   Procedure: ILEOCECECTOMY;  Surgeon: Ileana Roup, MD;  Location: [facility];  Service: General;  Laterality: N/A;  . INSERT / REPLACE / REMOVE PACEMAKER  07/2008   Complete heart block status post DDD with good function  . IR FLUORO GUIDE CV LINE RIGHT  04/03/2019  . IR US GUIDE VASC ACCESS RIGHT  04/03/2019  . LAPAROTOMY N/A 03/27/2017   Procedure: EXPLORATORY LAPAROTOMY;  Surgeon: Ileana Roup, MD;  Location: [facility];  Service: General;  Laterality: N/A;  . TONSILLECTOMY    . UPPER GI ENDOSCOPY       Medications: Current Outpatient Medications  Medication Sig Dispense Refill  . aspirin EC 81 MG tablet Take 81 mg by mouth daily.    [name] atorvastatin (LIPITOR) 40 MG tablet Take 1 tablet by  mouth once daily 90 tablet 3  . Carboxymethylcellul-Glycerin (LUBRICATING EYE DROPS OP) Place 1 drop into both eyes 3 (three) times daily as needed (dry eyes).    . carvedilol (COREG) 3.125 MG tablet Take 1 tablet (3.125 mg total) by mouth 2 (two) times daily with a meal. Hold on mornings you have dialysis and the night before 180 tablet 3  . ciclopirox (PENLAC) 8 % solution     . erythromycin ophthalmic ointment Place 1 application into the left eye at bedtime.    [name] glucose blood (FREESTYLE TEST STRIPS) test strip Use to check blood sugar daily 100 each 3  . glucose monitoring kit (FREESTYLE) monitoring kit USE TO MONITOR BLOOD GLUCOSE AS DIRECTED 1 each 1  . HYDROcodone-acetaminophen (NORCO) 5-325 MG tablet Take 1 tablet by mouth every 6 (six) hours as needed for moderate pain. 30 tablet 0  . lansoprazole (PREVACID) 15 MG capsule TAKE 1 CAPSULE BY MOUTH ONCE DAILY AT  12  NOON 90 capsule 0  . Methoxy PEG-Epoetin Beta (MIRCERA IJ) Mircera    . midodrine (PROAMATINE) 10 MG tablet Take by mouth.    . mirtazapine (REMERON) 7.5 MG tablet Take 0.5-1 tablets (3.75-7.5 mg total) by mouth at bedtime. 30 tablet 5  . nitroGLYCERIN (NITROSTAT) 0.4 MG SL tablet Place 1 tablet (0.4 mg total) under the tongue every 5 (five) minutes as needed for chest pain. 2 doses before calling for emergent help 25 tablet 3  . oxymetazoline (AFRIN) 0.05 % nasal spray Place 1 spray into both nostrils 2 (two) times daily as needed for congestion.    . saccharomyces boulardii (FLORASTOR) 250 MG capsule Take 1 capsule (250 mg total) by mouth 2 (two) times daily. 30 capsule 0  . sertraline (ZOLOFT) 100 MG tablet Take 1.5 tablets (150 mg total) by mouth daily. 135 tablet 3  . sevelamer carbonate (RENVELA) 800 MG tablet Take 1,600 mg by mouth 3 (three) times daily.    . sevelamer carbonate (RENVELA) 800 MG tablet Take 1,600 mg by mouth in the morning and at bedtime.     . SPS 15 GM/60ML suspension     . traMADol (ULTRAM) 50 MG  tablet Take 1 tablet (50  mg total) by mouth every 12 (twelve) hours as needed for moderate pain or severe pain. 12 tablet 0  . triamcinolone cream (KENALOG) 0.1 %     . vitamin B-12 (CYANOCOBALAMIN) 1000 MCG tablet Take 1,000 mcg by mouth daily.     No current facility-administered medications for this visit.    Allergies: Allergies  Allergen Reactions  . Bee Venom Anaphylaxis  . Lyrica [Pregabalin] Other (See Comments)    hallucinations  . Prednisone Other (See Comments)    hallucinations  . Zocor [Simvastatin] Nausea Only and Other (See Comments)    Headache with brand name only.  Can take the generic.    Social History: The patient  reports that he quit smoking about 12 years ago. His smoking use included cigarettes. He has a 81.00 pack-year smoking history. He has never used smokeless tobacco. He reports current alcohol use. He reports that he does not use drugs.   Family History: [name] family history includes Heart attack in his brother; Leukemia in his father; Stroke in his mother and sister.   Review of Systems: Please see the history of present illness.   Otherwise, the review of systems is positive for none.   All other systems are reviewed and negative.   Physical Exam: VS:  BP 140/60 (BP Location: Left Arm, Patient Position: Sitting, Cuff Size: Large)   Pulse 82   Ht '5\' 8"'  (1.727 m)   Wt 213 lb (96.6 kg)   BMI 32.39 kg/m  .  BMI Body mass index is 32.39 kg/m.  Wt Readings from Last 3 Encounters:  05/23/20 213 lb (96.6 kg)  05/14/20 212 lb (96.2 kg)  12/07/19 208 lb (94.3 kg)    GENERAL:  Well overweight elderly WM in NAD HEENT:  PERRL, EOMI, sclera are clear. Oropharynx is clear. NECK:  No jugular venous distention, carotid upstroke brisk and symmetric, no bruits, no thyromegaly or adenopathy LUNGS:  Clear to auscultation bilaterally CHEST:  Unremarkable HEART:  RRR,  PMI not displaced or sustained,S1 and S2 within normal limits, no S3, no S4: no  clicks, no rubs, no murmurs ABD:  Soft, nontender. BS +, no masses or bruits. Midline lower abdominal surgical wound has healed well.  EXT:  2 + pulses throughout, no  edema, AV fistula in right arm with good thrill.  SKIN:  Warm and dry.  No rashes NEURO:  Alert and oriented x 3. Cranial nerves II through XII intact. PSYCH:  Cognitively intact     LABORATORY DATA:  EKG:  EKGis ordered today. AV paced at rate 82. I have personally reviewed and interpreted this study.   Lab Results  Component Value Date   WBC 7.9 05/17/2019   HGB 11.6 (A) 10/27/2019   HCT 32 (A) 05/17/2019   PLT 129 (A) 05/17/2019   GLUCOSE 101 (H) 04/13/2019   CHOL 167 04/13/2018   TRIG 316.0 (H) 04/13/2018   HDL 33.00 (L) 04/13/2018   LDLDIRECT 60.0 04/13/2018   LDLCALC 65 06/26/2015   ALT 14 04/13/2019   AST 26 04/13/2019   NA 140 04/13/2019   K 4.5 10/27/2019   CL 103 06/09/2019   CREATININE 4.66 (HH) 04/13/2019   BUN 72 (A) 06/09/2019   CO2 29 04/13/2019   TSH 1.24 04/13/2018   PSA 0.55 09/19/2014   INR 1.3 (H) 04/03/2019   HGBA1C 6.7 10/27/2019   MICROALBUR 180.8 (H) 04/13/2018    BNP (last 3 results) No results for input(s): BNP in the last 8760 hours.  ProBNP (last 3 results) No results for input(s): PROBNP in the last 8760 hours.   Other Studies Reviewed Today:  Labs dated 03/11/18: glucose 119, BUN 62, creatinine 3.52. Potassium 6.2. Hgb 10.3.   Echo Study Conclusions from 01/23/16:  Study Conclusions  - Left ventricle: The cavity size was normal. Wall thickness was   increased in a pattern of mild LVH. Systolic function was normal.   The estimated ejection fraction was in the range of 60% to 65%.   Doppler parameters are consistent with abnormal left ventricular   relaxation (grade 1 diastolic dysfunction).  Echo 10/15/17: Study Conclusions  - Left ventricle: The cavity size was normal. There was severe   concentric hypertrophy. Systolic function was normal. The   estimated  ejection fraction was in the range of 60% to 65%. Wall   motion was normal; there were no regional wall motion   abnormalities. The study was not technically sufficient to allow   evaluation of LV diastolic dysfunction due to atrial   fibrillation. Doppler parameters are consistent with high   ventricular filling pressure. - Ventricular septum: Septal motion showed moderate paradox. These   changes are consistent with right ventricular pacing. - Aortic valve: Trileaflet; mildly thickened, moderately calcified   leaflets. - Mitral valve: There was mild regurgitation. - Left atrium: The atrium was mildly dilated.   Myoview 10/16/17: Defect 1: There is a large defect of severe severity present in the basal anteroseptal, mid anteroseptal and apex location.  This is an intermediate risk study.  The left ventricular ejection fraction is mildly decreased (45-54%).   Abnormal, intermediate risk stress nuclear study with large, partially reversible anteroseptal and apical defect; some of defect may be related to ventricular paced rhythm; there also appears to be mild to moderate ischemia; EF 48 with hypokinesis of the distal septum and apex; moderate LVE.   Assessment/Plan: 1. CAD - s/p  Cardiac catheterization in 2016 revealing Mid LAD lesion, 90% stenosed, 2nd Diag lesion, 90% stenosed, 3rd Mrg lesion, 90% stenosed. He received drug-eluting stents to each lesion in September 2016. Distal 70% circumflex lesion is treated medically. He remains asymptomatic.  Myoview in 2019 showed no ischemia with scar.   2. ICM EF 25-30%. S/p BiV ICD. Normal device function in October 2018.  Echo in July 2017 and April 2019  showed normalization of EF. ICD followed by Dr. Caryl Comes  3. Chronic systolic HF -Last Echo showed normal LV function. S/p BiV ICD.  Not a candidate for ACEi/ARB  due to CKD. Volume status maintained by HD.   4. HTN - on very low dose Coreg that is held on dialysis days.   5. HLD - on  statin therapy with good LDL control.   6. S/p cholecystectomy  7. ESRD now on HD. Followed by Nephrology    Signed: Masao [name] MD, [facility]    05/23/2020 10:04 AM

## 2020-05-23 ENCOUNTER — Other Ambulatory Visit: Payer: Self-pay

## 2020-05-23 ENCOUNTER — Ambulatory Visit: Payer: PPO | Admitting: Cardiology

## 2020-05-23 ENCOUNTER — Encounter: Payer: Self-pay | Admitting: Cardiology

## 2020-05-23 VITALS — BP 140/60 | HR 82 | Ht 68.0 in | Wt 213.0 lb

## 2020-05-23 DIAGNOSIS — Z95810 Presence of automatic (implantable) cardiac defibrillator: Secondary | ICD-10-CM

## 2020-05-23 DIAGNOSIS — I5022 Chronic systolic (congestive) heart failure: Secondary | ICD-10-CM | POA: Diagnosis not present

## 2020-05-23 DIAGNOSIS — N186 End stage renal disease: Secondary | ICD-10-CM | POA: Diagnosis not present

## 2020-05-23 DIAGNOSIS — Z992 Dependence on renal dialysis: Secondary | ICD-10-CM

## 2020-05-23 DIAGNOSIS — I442 Atrioventricular block, complete: Secondary | ICD-10-CM | POA: Diagnosis not present

## 2020-05-23 DIAGNOSIS — I2510 Atherosclerotic heart disease of native coronary artery without angina pectoris: Secondary | ICD-10-CM

## 2020-06-05 LAB — DUPLICATE REPORT

## 2020-06-05 LAB — WOUND CULTURE
MICRO NUMBER:: 11122749
SPECIMEN QUALITY:: ADEQUATE

## 2020-06-11 ENCOUNTER — Other Ambulatory Visit: Payer: Self-pay

## 2020-06-11 ENCOUNTER — Ambulatory Visit: Payer: PPO | Admitting: Podiatry

## 2020-06-11 DIAGNOSIS — E1129 Type 2 diabetes mellitus with other diabetic kidney complication: Secondary | ICD-10-CM | POA: Diagnosis not present

## 2020-06-11 DIAGNOSIS — L97412 Non-pressure chronic ulcer of right heel and midfoot with fat layer exposed: Secondary | ICD-10-CM | POA: Diagnosis not present

## 2020-06-11 DIAGNOSIS — E11621 Type 2 diabetes mellitus with foot ulcer: Secondary | ICD-10-CM

## 2020-06-11 DIAGNOSIS — Z992 Dependence on renal dialysis: Secondary | ICD-10-CM | POA: Diagnosis not present

## 2020-06-11 DIAGNOSIS — N186 End stage renal disease: Secondary | ICD-10-CM | POA: Diagnosis not present

## 2020-06-11 MED ORDER — SILVER SULFADIAZINE 1 % EX CREA: TOPICAL_CREAM | CUTANEOUS | 0 refills | Status: DC

## 2020-06-11 NOTE — Progress Notes (Signed)
  Subjective:  Patient ID: Rodney Farley, male    DOB: 07/14/1931,  MRN: [id number]  Chief Complaint  Patient presents with  . Wound Check    Pt states improvement with antibiotics, reduction in pain. Pt states still having residual pain that is constant and worse with activity or dialysis. Pt also states concern of broken glass as the initial cause of the wound due to finding some on the floor of his house.   86 y.o. male presents for wound care. Hx as above. Objective:  Physical Exam: Wound Location: right 1st met Wound Measurement: 0.3x0.3 Wound Base: granular. Peri-wound: Calloused Exudate: None: wound tissue dry wound without warmth, erythema, signs of acute infection Assessment:   1. Diabetic ulcer of right midfoot associated with type 2 diabetes mellitus, with fat layer exposed ([place])    Plan:  Patient was evaluated and treated and all questions answered.  Ulcer right hallux -Opened since previous, minimally debrided, apply silvadene and band-aid daily. Rx sent. -No need for further abx, -Continue normal shoegear -No palpable foreign body present in wound or peri-wound.  Return in about 1 month (around 07/11/2020) for Wound Care. XR at that visit if wound persists

## 2020-06-12 DIAGNOSIS — N2581 Secondary hyperparathyroidism of renal origin: Secondary | ICD-10-CM | POA: Diagnosis not present

## 2020-06-12 DIAGNOSIS — D631 Anemia in chronic kidney disease: Secondary | ICD-10-CM | POA: Diagnosis not present

## 2020-06-12 DIAGNOSIS — N186 End stage renal disease: Secondary | ICD-10-CM | POA: Diagnosis not present

## 2020-06-12 DIAGNOSIS — E876 Hypokalemia: Secondary | ICD-10-CM | POA: Diagnosis not present

## 2020-06-12 DIAGNOSIS — D509 Iron deficiency anemia, unspecified: Secondary | ICD-10-CM | POA: Diagnosis not present

## 2020-06-12 DIAGNOSIS — Z992 Dependence on renal dialysis: Secondary | ICD-10-CM | POA: Diagnosis not present

## 2020-06-18 ENCOUNTER — Ambulatory Visit (INDEPENDENT_AMBULATORY_CARE_PROVIDER_SITE_OTHER): Payer: PPO

## 2020-06-18 DIAGNOSIS — I442 Atrioventricular block, complete: Secondary | ICD-10-CM | POA: Diagnosis not present

## 2020-06-18 LAB — CUP PACEART REMOTE DEVICE CHECK
Battery Remaining Longevity: 13 mo
Battery Voltage: 2.88 V
Brady Statistic AP VP Percent: 79.48 %
Brady Statistic AP VS Percent: 0.09 %
Brady Statistic AS VP Percent: 20.35 %
Brady Statistic AS VS Percent: 0.09 %
Brady Statistic RA Percent Paced: 76.88 %
Brady Statistic RV Percent Paced: 99.57 %
Date Time Interrogation Session: 20211207033322
HighPow Impedance: 78 Ohm
Implantable Lead Implant Date: 20100112
Implantable Lead Implant Date: 20170313
Implantable Lead Implant Date: 20170313
Implantable Lead Location: 753858
Implantable Lead Location: 753859
Implantable Lead Location: 753860
Implantable Lead Model: 4598
Implantable Lead Model: 5076
Implantable Pulse Generator Implant Date: 20170313
Lead Channel Impedance Value: 418 Ohm
Lead Channel Impedance Value: 418 Ohm
Lead Channel Impedance Value: 418 Ohm
Lead Channel Impedance Value: 456 Ohm
Lead Channel Impedance Value: 456 Ohm
Lead Channel Impedance Value: 513 Ohm
Lead Channel Impedance Value: 532 Ohm
Lead Channel Impedance Value: 551 Ohm
Lead Channel Impedance Value: 760 Ohm
Lead Channel Impedance Value: 760 Ohm
Lead Channel Impedance Value: 779 Ohm
Lead Channel Impedance Value: 817 Ohm
Lead Channel Impedance Value: 836 Ohm
Lead Channel Pacing Threshold Amplitude: 0.5 V
Lead Channel Pacing Threshold Amplitude: 1.5 V
Lead Channel Pacing Threshold Amplitude: 1.75 V
Lead Channel Pacing Threshold Pulse Width: 0.4 ms
Lead Channel Pacing Threshold Pulse Width: 0.4 ms
Lead Channel Pacing Threshold Pulse Width: 0.4 ms
Lead Channel Sensing Intrinsic Amplitude: 1.5 mV
Lead Channel Sensing Intrinsic Amplitude: 1.5 mV
Lead Channel Sensing Intrinsic Amplitude: 12.375 mV
Lead Channel Sensing Intrinsic Amplitude: 12.375 mV
Lead Channel Setting Pacing Amplitude: 2.5 V
Lead Channel Setting Pacing Amplitude: 3 V
Lead Channel Setting Pacing Amplitude: 3.25 V
Lead Channel Setting Pacing Pulse Width: 0.4 ms
Lead Channel Setting Pacing Pulse Width: 0.4 ms
Lead Channel Setting Sensing Sensitivity: 0.3 mV

## 2020-06-28 NOTE — Progress Notes (Signed)
Remote ICD transmission.   

## 2020-07-09 ENCOUNTER — Other Ambulatory Visit: Payer: Self-pay

## 2020-07-09 ENCOUNTER — Ambulatory Visit: Payer: PPO | Admitting: Podiatry

## 2020-07-09 ENCOUNTER — Ambulatory Visit (INDEPENDENT_AMBULATORY_CARE_PROVIDER_SITE_OTHER): Payer: PPO

## 2020-07-09 DIAGNOSIS — L97412 Non-pressure chronic ulcer of right heel and midfoot with fat layer exposed: Secondary | ICD-10-CM

## 2020-07-09 DIAGNOSIS — S90414A Abrasion, right lesser toe(s), initial encounter: Secondary | ICD-10-CM

## 2020-07-09 DIAGNOSIS — E11621 Type 2 diabetes mellitus with foot ulcer: Secondary | ICD-10-CM

## 2020-07-09 MED ORDER — GABAPENTIN 300 MG PO CAPS: ORAL_CAPSULE | ORAL | 0 refills | Status: DC

## 2020-07-09 NOTE — Progress Notes (Signed)
  Subjective:  Patient ID: Rodney Farley, male    DOB: [birth year],  MRN: [id number]  Chief Complaint  Patient presents with  . Ulcer    F?U Rt ulcer check pt states," it looks about the same." -pt denies any improvement - 6/10 soreness and light redness -pt denies swelling/N/V/F/Ch Tx: abx ointment  . Hammer Toe   86 y.o. male presents for wound care. Hx as above. Objective:  Physical Exam: Wound Location: right 1st met Wound Measurement: 1x0.5 Wound Base: granular. Peri-wound: Calloused Exudate: None: wound tissue dry wound without warmth, erythema, signs of acute infection  Radiographs: 3 views right foot no fractures dislocations joint spaces preserved no evidence of foreign body small radiolucent dot likely artifact Assessment:   1. Diabetic ulcer of right midfoot associated with type 2 diabetes mellitus, with fat layer exposed ([place])   2. Abrasion of toe of right foot, initial encounter    Plan:  Patient was evaluated and treated and all questions answered.  Ulcer right hallux -X-rays taken no evidence of foreign body.  There is a slight pinpoint radiolucent area which is likely not foreign body and artifact. -The wound was debrided as below. -Dressed daily with Silvadene Band-Aid.  Advised to wear the surgical shoe as I think the area still getting too much pressure  Procedure: Excisional Debridement of Wound Indication: Removal of non-viable soft tissue from the wound to promote healing.  Anesthesia: none Pre-Debridement Wound Measurements: overlying blister  Post-Debridement Wound Measurements: 1 cm x 0.5 cm x 0.2 cm  Type of Debridement: Sharp Excisional Tissue Removed: Non-viable soft tissue Instrumentation: 15 blade and tissue nipper Depth of Debridement: subcutaneous tissue. Technique: Sharp excisional debridement to bleeding, viable wound base.  Dressing: Dry, sterile, compression dressing. Disposition: Patient tolerated procedure well. Patient to return  in 2 week for follow-up.     Return in about 2 weeks (around 07/23/2020) for Wound Care.

## 2020-07-12 DIAGNOSIS — E1129 Type 2 diabetes mellitus with other diabetic kidney complication: Secondary | ICD-10-CM | POA: Diagnosis not present

## 2020-07-12 DIAGNOSIS — Z992 Dependence on renal dialysis: Secondary | ICD-10-CM | POA: Diagnosis not present

## 2020-07-12 DIAGNOSIS — N186 End stage renal disease: Secondary | ICD-10-CM | POA: Diagnosis not present

## 2020-07-15 DIAGNOSIS — E1129 Type 2 diabetes mellitus with other diabetic kidney complication: Secondary | ICD-10-CM | POA: Diagnosis not present

## 2020-07-15 DIAGNOSIS — N2581 Secondary hyperparathyroidism of renal origin: Secondary | ICD-10-CM | POA: Diagnosis not present

## 2020-07-15 DIAGNOSIS — N186 End stage renal disease: Secondary | ICD-10-CM | POA: Diagnosis not present

## 2020-07-15 DIAGNOSIS — Z992 Dependence on renal dialysis: Secondary | ICD-10-CM | POA: Diagnosis not present

## 2020-07-15 DIAGNOSIS — E876 Hypokalemia: Secondary | ICD-10-CM | POA: Diagnosis not present

## 2020-07-15 DIAGNOSIS — D509 Iron deficiency anemia, unspecified: Secondary | ICD-10-CM | POA: Diagnosis not present

## 2020-07-16 ENCOUNTER — Other Ambulatory Visit: Payer: Self-pay | Admitting: Family Medicine

## 2020-07-23 ENCOUNTER — Other Ambulatory Visit: Payer: Self-pay

## 2020-07-23 ENCOUNTER — Ambulatory Visit: Payer: PPO | Admitting: Podiatry

## 2020-07-23 DIAGNOSIS — L97412 Non-pressure chronic ulcer of right heel and midfoot with fat layer exposed: Secondary | ICD-10-CM | POA: Diagnosis not present

## 2020-07-23 DIAGNOSIS — E11621 Type 2 diabetes mellitus with foot ulcer: Secondary | ICD-10-CM | POA: Diagnosis not present

## 2020-07-23 DIAGNOSIS — R2689 Other abnormalities of gait and mobility: Secondary | ICD-10-CM | POA: Diagnosis not present

## 2020-07-23 DIAGNOSIS — Z9181 History of falling: Secondary | ICD-10-CM

## 2020-07-23 NOTE — Progress Notes (Signed)
  Subjective:  Patient ID: Rodney Farley, male    DOB: [birth year],  MRN: [id number]  Chief Complaint  Patient presents with  . Wound Check    Wound check of bottom left foot. Pt. States improvement with some pain.    87 y.o. male presents for wound care. Hx as above.  Thinks that the wound is doing better is very comfortable in a surgical shoe that he was given. Objective:  Physical Exam: Wound Location: right 1st met Wound Measurement: Pinpoint Wound Base: granular. Peri-wound: Calloused Exudate: None: wound tissue dry wound without warmth, erythema, signs of acute infection Assessment:   1. Diabetic ulcer of right midfoot associated with type 2 diabetes mellitus, with fat layer exposed ([place])   2. Balance disorder   3. History of fall   4. At high risk for injury related to fall    Plan:  Patient was evaluated and treated and all questions answered.  Ulcer right hallux -Wound gently debrided today.  On procedure.  There is a small area of incomplete epithelialization but overall the wound is much improved.  Redundant skin was excised.  Continue with Silvadene and Band-Aid daily.  Continue surgical shoe x2 weeks.  Balance disorder -Patient given information on balance bracing I do think given his history of falls balance issues he would benefit from these.  Should patient wish to proceed we will further discuss next visit.     No follow-ups on file.

## 2020-07-24 ENCOUNTER — Telehealth: Payer: Self-pay

## 2020-07-24 NOTE — Telephone Encounter (Signed)
Volunteer called patient on behalf of Palliative Care and did not get a answer from patient/family. ° °

## 2020-07-29 DIAGNOSIS — Z95810 Presence of automatic (implantable) cardiac defibrillator: Secondary | ICD-10-CM | POA: Insufficient documentation

## 2020-07-30 ENCOUNTER — Encounter: Payer: PPO | Admitting: Internal Medicine

## 2020-07-30 ENCOUNTER — Other Ambulatory Visit: Payer: Self-pay

## 2020-07-30 DIAGNOSIS — Z95810 Presence of automatic (implantable) cardiac defibrillator: Secondary | ICD-10-CM

## 2020-07-30 DIAGNOSIS — I5022 Chronic systolic (congestive) heart failure: Secondary | ICD-10-CM

## 2020-07-30 DIAGNOSIS — I442 Atrioventricular block, complete: Secondary | ICD-10-CM

## 2020-07-30 DIAGNOSIS — I255 Ischemic cardiomyopathy: Secondary | ICD-10-CM

## 2020-07-31 LAB — CBC AND DIFFERENTIAL: Hemoglobin: 6.3 — AB (ref 13.5–17.5)

## 2020-08-07 LAB — HEMOGLOBIN A1C: Hemoglobin A1C: 6.3

## 2020-08-12 DIAGNOSIS — E1129 Type 2 diabetes mellitus with other diabetic kidney complication: Secondary | ICD-10-CM | POA: Diagnosis not present

## 2020-08-12 DIAGNOSIS — N186 End stage renal disease: Secondary | ICD-10-CM | POA: Diagnosis not present

## 2020-08-12 DIAGNOSIS — Z992 Dependence on renal dialysis: Secondary | ICD-10-CM | POA: Diagnosis not present

## 2020-08-14 DIAGNOSIS — D631 Anemia in chronic kidney disease: Secondary | ICD-10-CM | POA: Diagnosis not present

## 2020-08-14 DIAGNOSIS — E876 Hypokalemia: Secondary | ICD-10-CM | POA: Diagnosis not present

## 2020-08-14 DIAGNOSIS — D509 Iron deficiency anemia, unspecified: Secondary | ICD-10-CM | POA: Diagnosis not present

## 2020-08-14 DIAGNOSIS — N2581 Secondary hyperparathyroidism of renal origin: Secondary | ICD-10-CM | POA: Diagnosis not present

## 2020-08-14 DIAGNOSIS — Z992 Dependence on renal dialysis: Secondary | ICD-10-CM | POA: Diagnosis not present

## 2020-08-14 DIAGNOSIS — N186 End stage renal disease: Secondary | ICD-10-CM | POA: Diagnosis not present

## 2020-08-20 ENCOUNTER — Ambulatory Visit: Payer: PPO | Admitting: Podiatry

## 2020-08-21 ENCOUNTER — Telehealth: Payer: Self-pay

## 2020-08-21 NOTE — Telephone Encounter (Signed)
Rodney Farley is calling in from [facility], she is requesting a DME order. Requesting a rolator, grab bars that go above the bed, and either a hospital bed or railing to go to current bed as they have been having an issue with Collier Salina falling out of bed. Wanting to know if the orders to achieve the DME can be sent to [place]. Did a three way call with daughter and Rodney Farley and daughter states she will call back with an appointment with Dr.Hunter.

## 2020-08-21 NOTE — Telephone Encounter (Signed)
You can right orders under generalized weakness and at risk for falls- highly likely they will require a visit for the bed. I agree we need to follow up- it has been a long while since hes come in

## 2020-08-21 NOTE — Telephone Encounter (Signed)
Ok to order 

## 2020-08-22 NOTE — Telephone Encounter (Signed)
Above orders faxed to [place].

## 2020-08-25 ENCOUNTER — Other Ambulatory Visit: Payer: Self-pay | Admitting: Podiatry

## 2020-08-27 ENCOUNTER — Encounter: Payer: PPO | Admitting: Internal Medicine

## 2020-08-29 NOTE — Progress Notes (Signed)
Phone [phone] In person visit   Subjective:   Rodney Farley is a 87 y.o. year old very pleasant male patient who presents for/with See problem oriented charting Chief Complaint  Patient presents with  . Medical Equipment Approval    This visit occurred during the SARS-CoV-2 public health emergency.  Safety protocols were in place, including screening questions prior to the visit, additional usage of staff PPE, and extensive cleaning of exam room while observing appropriate contact time as indicated for disinfecting solutions.   Past Medical History-  Patient Active Problem List   Diagnosis Date Noted  . Ruptured appendicitis 03/26/2017    Priority: High  . Marital stress 02/18/2017    Priority: High  . COPD (chronic obstructive pulmonary disease) ([place])     Priority: High  . Chronic systolic CHF (congestive heart failure) ([place]) 04/08/2015    Priority: High  . DOE (dyspnea on exertion), due to significant CAD 03/28/2015    Priority: High  . ESRD on hemodialysis ([place]) 03/04/2015    Priority: High  . CAD (coronary artery disease) 09/13/2014    Priority: High  . Major depression, recurrent, full remission ([place]) 08/05/2012    Priority: High  . Controlled type 2 diabetes mellitus with chronic kidney disease on chronic dialysis ([place]) 08/05/2012    Priority: High  . Ischemic cardiomyopathy 11/03/2011    Priority: High  . Atrioventricular block, complete (HCC)     Priority: High  . Pacemaker     Priority: High  . Hyperlipidemia associated with type 2 diabetes mellitus ([place]) 04/13/2018    Priority: Medium  . BPH associated with nocturia 04/27/2016    Priority: Medium  . Diabetic peripheral neuropathy (HCC)     Priority: Medium  . Bell's palsy     Priority: Medium  . Gout 06/26/2015    Priority: Medium  . Sleep apnea 08/05/2012    Priority: Medium  . Hypertension associated with diabetes ([place]) 08/05/2012    Priority: Medium  . S/P cholecystectomy 10/13/2017     Priority: Low  . Trigger finger 06/10/2016    Priority: Low  . Diverticulitis 04/08/2015    Priority: Low  . GERD (gastroesophageal reflux disease) 08/07/2012    Priority: Low  . Obesity     Priority: Low  . 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 Dependence on renal dialysis ([place]) 11/13/2019  . Coagulation defect, unspecified ([place]) 05/11/2019  . Encounter for immunization 04/25/2019  . Thrombocytopenia ([place]) 04/13/2019  . Hypokalemia 04/12/2019  . Anaphylactic shock, unspecified, initial encounter 04/10/2019  . Anemia in chronic kidney disease 04/10/2019  . Complication of vascular dialysis catheter 04/10/2019  . Iron deficiency anemia, unspecified 04/10/2019  . Secondary hyperparathyroidism of renal origin ([place]) 04/10/2019  . Hyperkalemia, diminished renal excretion 04/01/2019  . Diarrhea 05/11/2017  . CKD (chronic kidney disease) stage 4, GFR 15-29 ml/min (HCC) 03/04/2015  . Diabetes type 2, uncontrolled ([place]) 08/05/2012    Medications- reviewed and updated Current Outpatient Medications  Medication Sig Dispense Refill  . aspirin EC 81 MG tablet Take 81 mg by mouth daily.    [name] atorvastatin (LIPITOR) 40 MG tablet Take 1 tablet by mouth once daily 90 tablet 3  . Carboxymethylcellul-Glycerin (LUBRICATING EYE DROPS OP) Place 1 drop into both eyes 3 (three) times daily as needed (dry eyes).    . carvedilol (COREG) 3.125 MG tablet Take 1 tablet (3.125 mg total) by mouth 2 (two) times daily with a meal. Hold on mornings you have dialysis  and the night before 180 tablet 3  . ciclopirox (PENLAC) 8 % solution     . erythromycin ophthalmic ointment Place 1 application into the left eye at bedtime.    . gabapentin (NEURONTIN) 300 MG capsule TAKE 1 CAPSULE BY MOUTH AT BEDTIME FOR 7 DAYS, THEN TAKE 2 CAPSULES DAILY - MORNING AND BEDTIME THEREAFTER 90 capsule 0  . lansoprazole (PREVACID) 15 MG capsule TAKE 1 CAPSULE BY MOUTH ONCE DAILY AT  12  NOON 90 capsule  0  . Methoxy PEG-Epoetin Beta (MIRCERA IJ) Mircera    . midodrine (PROAMATINE) 10 MG tablet Take by mouth.    . nitroGLYCERIN (NITROSTAT) 0.4 MG SL tablet Place 1 tablet (0.4 mg total) under the tongue every 5 (five) minutes as needed for chest pain. 2 doses before calling for emergent help 25 tablet 3  . oxymetazoline (AFRIN) 0.05 % nasal spray Place 1 spray into both nostrils 2 (two) times daily as needed for congestion.    . sertraline (ZOLOFT) 100 MG tablet Take 1.5 tablets (150 mg total) by mouth daily. 135 tablet 3  . sevelamer carbonate (RENVELA) 800 MG tablet Take 1,600 mg by mouth 3 (three) times daily.    . sevelamer carbonate (RENVELA) 800 MG tablet Take 1,600 mg by mouth in the morning and at bedtime.     . silver sulfADIAZINE (SILVADENE) 1 % cream Apply pea-sized amount to wound daily. 50 g 0  . triamcinolone cream (KENALOG) 0.1 %     . vitamin B-12 (CYANOCOBALAMIN) 1000 MCG tablet Take 1,000 mcg by mouth daily.    [name] glucose blood (FREESTYLE TEST STRIPS) test strip Use to check blood sugar daily (Patient not taking: Reported on 08/30/2020) 100 each 3  . glucose monitoring kit (FREESTYLE) monitoring kit USE TO MONITOR BLOOD GLUCOSE AS DIRECTED (Patient not taking: Reported on 08/30/2020) 1 each 1  . HYDROcodone-acetaminophen (NORCO) 5-325 MG tablet Take 1 tablet by mouth every 6 (six) hours as needed for moderate pain. (Patient not taking: Reported on 08/30/2020) 30 tablet 0  . mirtazapine (REMERON) 7.5 MG tablet Take 0.5-1 tablets (3.75-7.5 mg total) by mouth at bedtime. 30 tablet 5  . saccharomyces boulardii (FLORASTOR) 250 MG capsule Take 1 capsule (250 mg total) by mouth 2 (two) times daily. 30 capsule 0  . SPS 15 GM/60ML suspension     . traMADol (ULTRAM) 50 MG tablet Take 1 tablet (50 mg total) by mouth every 12 (twelve) hours as needed for moderate pain or severe pain. (Patient not taking: Reported on 08/30/2020) 12 tablet 0   No current facility-administered medications for this  visit.     Objective:  BP (!) 104/54   Pulse 75   Temp 97.6 F (36.4 C) (Temporal)   Ht '5\' 8"'  (1.727 m)   Wt 207 lb (93.9 kg)   SpO2 95%   BMI 31.47 kg/m  Gen: NAD, resting comfortably CV: RRR no murmurs rubs or gallops Lungs: CTAB no crackles, wheeze, rhonchi Ext: no edema Skin: warm, dry Neuro: Walks with walker    Assessment and Plan   #Falls with generalized weakness and fall risk S: Patient had a fall out of bed about 3 weeks ago. No injury but was unable to get up but was eventually able to get up within 20 minutes. Working [facility] for getting equipment.   Later had a fall in daytime with a cane- has transitioned to walker since that time. Has a tray to help with transporting coffee in his  apartment.   Also getting set up with an ankle suppor twith Dr. March Rummage- they think that will help reduce risk.  A/P: We are concerned about patient's fall risk particularly at night with falling out of bed-we will attempt to get him set up with either a hospital bed or a combination of a trapeze bar in bed railing for current bed-trapeze bar would help him get more toward the center of the bed.  He does prefer this option if possible.  Also will write for a Rollator walker to use in the grocery store-around the house he is doing well with a standard walker but he needs someone to sit if he gets fatigued.  He prefers a Teaching laboratory technician over using a cart as he wants to keep up mobility is much as possible  #End-stage renal disease/secondary hyperparathyroidism S:Patient on dialysis on MW.  Also compliant with Renvela to lower phosphorus. A/P: Stable. Continue current medications.    #hyperlipidemia/CAD S: Medication:Atorvastatin 40 mg, aspirin 81 mg  No chest pain or shortness of breath other than with really cold air  A/P: Stable. Continue current medications.   # Diabetes S: Medication: currently diet controlled. Has required insulin at times in past.   A/P: well  controlled on January  2022- at 6.3. continue with out meds  #hypertension and orthostic hypotension history particuarly with dialysis S: medication: Carvedilol 3.125 mg twice daily (down from 12.5 mg BID).  Hold on mornings of dialysis as well as evening before dialysis  Also now on midodrine- takes morning of dialysis A/P: Stable. Continue current medications.   # Depression S: Medication:Zoloft 150 mg.  In the past has tried Remeron half to 1 tablet at bedtime. -We placed a [facility] referral last visit to see if they can help with financial stressors.they were not able to help he reports.  Depression screen [facility] 2/9 08/30/2020 11/16/2019 08/01/2019  Decreased Interest '3 3 3  ' Down, Depressed, Hopeless '3 3 3  ' PHQ - 2 Score '6 6 6  ' Altered sleeping '3 3 3  ' Tired, decreased energy '3 3 3  ' Change in appetite 3 0 3  Feeling bad or failure about yourself  '3 3 3  ' Trouble concentrating 2 0 3  Moving slowly or fidgety/restless 0 0 0  Suicidal thoughts 0 0 0  PHQ-9 Score '20 15 21  ' Difficult doing work/chores Not difficult at all Extremely dIfficult Very difficult  Some recent data might be hidden  A/P: PHQ 9 is elevated but patient is just coming from dialysis and states that most of his feeling down is related to dialysis itself-he feels overall well and is enjoying life outside of those times-he would prefer to continue current medication-we will consider this overall full control/remission  #Independence -Gets a lot of support from son and daughter in law who live very close to his apartment - still drives himself in daytime and limited range- that seems to help. Family picks up from dialysis if needed.   #Mild thrombocytopenia on prior labs.  This is monitored through dialysis with no reported recent change.  I did see his hemoglobin levels and these were improved from last check in 2020 Lab Results  Component Value Date   WBC 7.9 05/17/2019   HGB 6.3 (A) 07/31/2020   HCT 32 (A) 05/17/2019   MCV  92.6 04/13/2019   PLT 129 (A) 05/17/2019    Recommended follow up: 6 to 12 months or sooner if needed-most of patient's needs are being addressed by dialysis  Future Appointments  Date Time Provider [place]  09/10/2020  2:45 PM Evelina Bucy, DPM [facility]  09/17/2020  7:40 AM [facility] DEVICE REMOTES [facility]  12/17/2020  7:40 AM [facility] DEVICE REMOTES [facility]  12/31/2020  2:00 PM Deboraha Sprang, MD [facility]  03/18/2021  7:40 AM [facility] DEVICE REMOTES [facility]  06/17/2021  7:40 AM [facility] DEVICE REMOTES [facility]  09/16/2021  7:40 AM [facility] DEVICE REMOTES [facility]  12/16/2021  7:40 AM [facility] DEVICE REMOTES [facility]    Lab/Order associations:   ICD-10-CM   1. Generalized weakness  R53.1   2. ESRD on hemodialysis (HCC) Chronic N18.6    Z99.2   3. Chronic obstructive pulmonary disease, unspecified COPD type (HCC) Chronic J44.9   4. Chronic systolic CHF (congestive heart failure) (HCC) Chronic  Well-controlled primarily through dialysis. I50.22   5. Secondary hyperparathyroidism of renal origin ([place]) Chronic N25.81   6. Major depression, recurrent, full remission ([place])  F33.42   7. Thrombocytopenia (HCC) Chronic D69.6   8. Complete heart block (HCC) Chronic  Pacemaker in place- stable- contineu to monitor I44.2   9. At high risk for falls  Z91.81   10. Coronary artery disease involving native coronary artery of native heart without angina pectoris Chronic I25.10     No orders of the defined types were placed in this encounter.    Return precautions advised.  Garret Reddish, MD

## 2020-08-29 NOTE — Patient Instructions (Addendum)
Health Maintenance Due  Topic Date Due  . OPHTHALMOLOGY EXAM Will sign release form at check out.  01/17/2020   No changes today  Recommended follow up: Somewhere between 6-12 month follow up or certainly sooner if you need me

## 2020-08-30 ENCOUNTER — Other Ambulatory Visit: Payer: Self-pay

## 2020-08-30 ENCOUNTER — Encounter: Payer: Self-pay | Admitting: Family Medicine

## 2020-08-30 ENCOUNTER — Ambulatory Visit (INDEPENDENT_AMBULATORY_CARE_PROVIDER_SITE_OTHER): Payer: PPO | Admitting: Family Medicine

## 2020-08-30 VITALS — BP 104/54 | HR 75 | Temp 97.6°F | Ht 68.0 in | Wt 207.0 lb

## 2020-08-30 DIAGNOSIS — I5022 Chronic systolic (congestive) heart failure: Secondary | ICD-10-CM | POA: Diagnosis not present

## 2020-08-30 DIAGNOSIS — R531 Weakness: Secondary | ICD-10-CM | POA: Diagnosis not present

## 2020-08-30 DIAGNOSIS — Z9181 History of falling: Secondary | ICD-10-CM

## 2020-08-30 DIAGNOSIS — I2510 Atherosclerotic heart disease of native coronary artery without angina pectoris: Secondary | ICD-10-CM

## 2020-08-30 DIAGNOSIS — D696 Thrombocytopenia, unspecified: Secondary | ICD-10-CM | POA: Diagnosis not present

## 2020-08-30 DIAGNOSIS — F3342 Major depressive disorder, recurrent, in full remission: Secondary | ICD-10-CM | POA: Diagnosis not present

## 2020-08-30 DIAGNOSIS — N2581 Secondary hyperparathyroidism of renal origin: Secondary | ICD-10-CM

## 2020-08-30 DIAGNOSIS — I442 Atrioventricular block, complete: Secondary | ICD-10-CM

## 2020-08-30 DIAGNOSIS — Z992 Dependence on renal dialysis: Secondary | ICD-10-CM

## 2020-08-30 DIAGNOSIS — N186 End stage renal disease: Secondary | ICD-10-CM

## 2020-08-30 DIAGNOSIS — J449 Chronic obstructive pulmonary disease, unspecified: Secondary | ICD-10-CM

## 2020-09-09 ENCOUNTER — Telehealth: Payer: Self-pay | Admitting: Family Medicine

## 2020-09-09 DIAGNOSIS — Z992 Dependence on renal dialysis: Secondary | ICD-10-CM | POA: Diagnosis not present

## 2020-09-09 DIAGNOSIS — E1129 Type 2 diabetes mellitus with other diabetic kidney complication: Secondary | ICD-10-CM | POA: Diagnosis not present

## 2020-09-09 DIAGNOSIS — N186 End stage renal disease: Secondary | ICD-10-CM | POA: Diagnosis not present

## 2020-09-09 NOTE — Telephone Encounter (Signed)
Left message for patient to call back and schedule Medicare Annual Wellness Visit (AWV) either virtually OR in office.   Last AWV 08/01/19; please schedule at anytime with [facility]Nurse Health Advisor at [facility].  This should be a 45 minute visit.

## 2020-09-10 ENCOUNTER — Other Ambulatory Visit: Payer: Self-pay

## 2020-09-10 ENCOUNTER — Ambulatory Visit: Payer: PPO | Admitting: Podiatry

## 2020-09-10 DIAGNOSIS — R2689 Other abnormalities of gait and mobility: Secondary | ICD-10-CM | POA: Diagnosis not present

## 2020-09-10 DIAGNOSIS — E11621 Type 2 diabetes mellitus with foot ulcer: Secondary | ICD-10-CM

## 2020-09-10 DIAGNOSIS — L97412 Non-pressure chronic ulcer of right heel and midfoot with fat layer exposed: Secondary | ICD-10-CM | POA: Diagnosis not present

## 2020-09-10 DIAGNOSIS — Z9181 History of falling: Secondary | ICD-10-CM

## 2020-09-10 DIAGNOSIS — R269 Unspecified abnormalities of gait and mobility: Secondary | ICD-10-CM | POA: Diagnosis not present

## 2020-09-10 NOTE — Progress Notes (Signed)
  Subjective:  Patient ID: Rodney Farley, male    DOB: 04-12-1932,  MRN: [id number]  Chief Complaint  Patient presents with  . Wound Check    F/U Rt wound check -per pt looks about the same - yesterday with increae of pain but no pain today Tx; silvadnee and bandaid   87 y.o. male presents for wound care. Hx as above.  Presents with his son [name] today. Objective:  Physical Exam: Wound Location: right 1st met Wound Measurement: 1x0.5 Wound Base: granular. Peri-wound: Calloused Exudate: None: wound tissue dry wound without warmth, erythema, signs of acute infection Assessment:   1. At high risk for injury related to fall   2. Gait disturbance   3. Diabetic ulcer of right midfoot associated with type 2 diabetes mellitus, with fat layer exposed ([place])   4. Balance disorder   5. History of fall    Plan:  Patient was evaluated and treated and all questions answered.  Ulcer right hallux -We had a lengthy discussion today about how to get this wound healed.  He is wearing his normal shoes today and not a surgical shoe.  His son [name] relates that he is worried about him driving the surgical shoe.  We discussed proper care for the wound his son relates that he has difficulty putting ointment and Band-Aid on the wound daily given eyesight and physical restrictions.  I discussed that I do not think he qualifies for home health care as he is not technically homebound. -Wound debrided today. -Would benefit from DM shoes.  Will make appointment for fabrication -DM insert dispensed today, courtesy; offloaded the ulceration.  This was fabricated by the physician with a diabetic soft insert and Plastizote.  Procedure: Selective Debridement of Wound Rationale: Removal of devitalized tissue from the wound to promote healing.  Pre-Debridement Wound Measurements: 1 cm x 0.5 cm x 0.3 cm  Post-Debridement Wound Measurements: same as pre-debridement. Type of Debridement: sharp  selective Instrumentation: dermal curette, tissue nipper Tissue Removed: Devitalized soft-tissue Dressing: Dry, sterile, compression dressing. Disposition: Patient tolerated procedure well.   Balance disorder -Again lengthy discussion about his balance issues.  His son relates that he has deconditioned considerably since Covid and not being as active.  I do think he has lower extremity balance and strength issue but I think upper extremity weaknesses are present as well. -Defer bracing today though I do think he qualifies for balance braces.  Financially patient and son do not think he can afford these. -Refer to PT for strengthening  Total time for visit both face-to-face and non face-to-face including patient care, review of chart/imaging, documentation: 45 mins    No follow-ups on file.

## 2020-09-11 ENCOUNTER — Telehealth: Payer: Self-pay

## 2020-09-11 DIAGNOSIS — Z992 Dependence on renal dialysis: Secondary | ICD-10-CM | POA: Diagnosis not present

## 2020-09-11 DIAGNOSIS — N186 End stage renal disease: Secondary | ICD-10-CM | POA: Diagnosis not present

## 2020-09-11 DIAGNOSIS — D509 Iron deficiency anemia, unspecified: Secondary | ICD-10-CM | POA: Diagnosis not present

## 2020-09-11 DIAGNOSIS — D631 Anemia in chronic kidney disease: Secondary | ICD-10-CM | POA: Diagnosis not present

## 2020-09-11 DIAGNOSIS — N2581 Secondary hyperparathyroidism of renal origin: Secondary | ICD-10-CM | POA: Diagnosis not present

## 2020-09-11 DIAGNOSIS — E876 Hypokalemia: Secondary | ICD-10-CM | POA: Diagnosis not present

## 2020-09-11 NOTE — Telephone Encounter (Signed)
Your note said that this info was needed to approve of the bed. Trapeze bars and bed should be different. I believe he was ok if we could just get trapeze bars approved. Even if we can just get bars approved that's ok starting point- I doubt he will qualify for the bed based on the below

## 2020-09-11 NOTE — Telephone Encounter (Signed)
Duspi is calling in from [facility] they are needing more information to get a hospital bed.

## 2020-09-11 NOTE — Telephone Encounter (Signed)
I will call his family and speak with them but yes those were the "bars" that I was speaking about.

## 2020-09-11 NOTE — Telephone Encounter (Signed)
Spoke with someone from [employer]. They advised me that the notes need to be very specific for the insurance company to approve of this patient receiving this bed. It has to state specifically that " The patient needs this bed for frequent and immediate changes due to pain",  " The patient needs to be elevated to a certain degree to avoid aspiration", "Inability to reposition with the bed bars" and also that there is no way for the patient to use pillows, wedges or a normal bed to accomplish any of the things I listed above.

## 2020-09-11 NOTE — Telephone Encounter (Signed)
Please talk to family- im not sure he qualifies for hospital bed in this case  I think we also had a listing for trapeze bars- were those not covered either?

## 2020-09-12 NOTE — Telephone Encounter (Signed)
Pt.'s son states [employer] is needing the AVS from most recent visit with Dr. Ansel Bong signature on it. This is for a bed, trapeze bar, and walker.   According to son, [employer] is ready to deliver these items tomorrow, they are only missing a 71 signature.    [employer] Fax [phone]

## 2020-09-12 NOTE — Telephone Encounter (Signed)
Last office note has been faxed to [employer].

## 2020-09-16 DIAGNOSIS — Z992 Dependence on renal dialysis: Secondary | ICD-10-CM | POA: Diagnosis not present

## 2020-09-16 DIAGNOSIS — N2581 Secondary hyperparathyroidism of renal origin: Secondary | ICD-10-CM | POA: Diagnosis not present

## 2020-09-16 DIAGNOSIS — D509 Iron deficiency anemia, unspecified: Secondary | ICD-10-CM | POA: Diagnosis not present

## 2020-09-16 DIAGNOSIS — N186 End stage renal disease: Secondary | ICD-10-CM | POA: Diagnosis not present

## 2020-09-17 ENCOUNTER — Ambulatory Visit (INDEPENDENT_AMBULATORY_CARE_PROVIDER_SITE_OTHER): Payer: PPO

## 2020-09-17 ENCOUNTER — Telehealth: Payer: Self-pay

## 2020-09-17 DIAGNOSIS — I442 Atrioventricular block, complete: Secondary | ICD-10-CM | POA: Diagnosis not present

## 2020-09-17 LAB — CUP PACEART REMOTE DEVICE CHECK
Battery Remaining Longevity: 10 mo
Battery Voltage: 2.86 V
Brady Statistic AP VP Percent: 80.8 %
Brady Statistic AP VS Percent: 0.06 %
Brady Statistic AS VP Percent: 19.07 %
Brady Statistic AS VS Percent: 0.07 %
Brady Statistic RA Percent Paced: 80.39 %
Brady Statistic RV Percent Paced: 99.73 %
Date Time Interrogation Session: 20220308033323
HighPow Impedance: 68 Ohm
Implantable Lead Implant Date: 20100112
Implantable Lead Implant Date: 20170313
Implantable Lead Implant Date: 20170313
Implantable Lead Location: 753858
Implantable Lead Location: 753859
Implantable Lead Location: 753860
Implantable Lead Model: 4598
Implantable Lead Model: 5076
Implantable Pulse Generator Implant Date: 20170313
Lead Channel Impedance Value: 399 Ohm
Lead Channel Impedance Value: 399 Ohm
Lead Channel Impedance Value: 418 Ohm
Lead Channel Impedance Value: 456 Ohm
Lead Channel Impedance Value: 456 Ohm
Lead Channel Impedance Value: 475 Ohm
Lead Channel Impedance Value: 475 Ohm
Lead Channel Impedance Value: 589 Ohm
Lead Channel Impedance Value: 760 Ohm
Lead Channel Impedance Value: 779 Ohm
Lead Channel Impedance Value: 779 Ohm
Lead Channel Impedance Value: 779 Ohm
Lead Channel Impedance Value: 817 Ohm
Lead Channel Pacing Threshold Amplitude: 0.5 V
Lead Channel Pacing Threshold Amplitude: 1.375 V
Lead Channel Pacing Threshold Amplitude: 1.625 V
Lead Channel Pacing Threshold Pulse Width: 0.4 ms
Lead Channel Pacing Threshold Pulse Width: 0.4 ms
Lead Channel Pacing Threshold Pulse Width: 0.4 ms
Lead Channel Sensing Intrinsic Amplitude: 1.25 mV
Lead Channel Sensing Intrinsic Amplitude: 1.25 mV
Lead Channel Sensing Intrinsic Amplitude: 12.375 mV
Lead Channel Sensing Intrinsic Amplitude: 12.375 mV
Lead Channel Setting Pacing Amplitude: 2.5 V
Lead Channel Setting Pacing Amplitude: 3 V
Lead Channel Setting Pacing Amplitude: 3.25 V
Lead Channel Setting Pacing Pulse Width: 0.4 ms
Lead Channel Setting Pacing Pulse Width: 0.4 ms
Lead Channel Setting Sensing Sensitivity: 0.3 mV

## 2020-09-17 NOTE — Telephone Encounter (Signed)
The patient called to get help with his monitor however, I explained to him that his monitor is automatic. He do not need to send a manual transmission unless the nurse ask for one. I told him since he getting the error code to contact [employer] tech support to get additional help with his handheld.

## 2020-09-21 DIAGNOSIS — R531 Weakness: Secondary | ICD-10-CM | POA: Diagnosis not present

## 2020-09-21 DIAGNOSIS — Z9181 History of falling: Secondary | ICD-10-CM | POA: Diagnosis not present

## 2020-09-23 DIAGNOSIS — D509 Iron deficiency anemia, unspecified: Secondary | ICD-10-CM | POA: Diagnosis not present

## 2020-09-23 DIAGNOSIS — N186 End stage renal disease: Secondary | ICD-10-CM | POA: Diagnosis not present

## 2020-09-23 DIAGNOSIS — Z992 Dependence on renal dialysis: Secondary | ICD-10-CM | POA: Diagnosis not present

## 2020-09-23 DIAGNOSIS — N2581 Secondary hyperparathyroidism of renal origin: Secondary | ICD-10-CM | POA: Diagnosis not present

## 2020-09-23 DIAGNOSIS — E876 Hypokalemia: Secondary | ICD-10-CM | POA: Diagnosis not present

## 2020-09-25 NOTE — Progress Notes (Signed)
Remote ICD transmission.   

## 2020-09-30 DIAGNOSIS — N186 End stage renal disease: Secondary | ICD-10-CM | POA: Diagnosis not present

## 2020-09-30 DIAGNOSIS — Z992 Dependence on renal dialysis: Secondary | ICD-10-CM | POA: Diagnosis not present

## 2020-09-30 DIAGNOSIS — N2581 Secondary hyperparathyroidism of renal origin: Secondary | ICD-10-CM | POA: Diagnosis not present

## 2020-09-30 DIAGNOSIS — E876 Hypokalemia: Secondary | ICD-10-CM | POA: Diagnosis not present

## 2020-09-30 DIAGNOSIS — D509 Iron deficiency anemia, unspecified: Secondary | ICD-10-CM | POA: Diagnosis not present

## 2020-10-01 ENCOUNTER — Ambulatory Visit: Payer: PPO | Admitting: Podiatry

## 2020-10-01 ENCOUNTER — Ambulatory Visit (INDEPENDENT_AMBULATORY_CARE_PROVIDER_SITE_OTHER): Payer: PPO | Admitting: *Deleted

## 2020-10-01 ENCOUNTER — Other Ambulatory Visit: Payer: Self-pay

## 2020-10-01 DIAGNOSIS — N186 End stage renal disease: Secondary | ICD-10-CM

## 2020-10-01 DIAGNOSIS — E11621 Type 2 diabetes mellitus with foot ulcer: Secondary | ICD-10-CM

## 2020-10-01 DIAGNOSIS — L97412 Non-pressure chronic ulcer of right heel and midfoot with fat layer exposed: Secondary | ICD-10-CM

## 2020-10-01 DIAGNOSIS — R269 Unspecified abnormalities of gait and mobility: Secondary | ICD-10-CM

## 2020-10-01 DIAGNOSIS — E1122 Type 2 diabetes mellitus with diabetic chronic kidney disease: Secondary | ICD-10-CM

## 2020-10-01 DIAGNOSIS — R2689 Other abnormalities of gait and mobility: Secondary | ICD-10-CM

## 2020-10-01 MED ORDER — SILVER SULFADIAZINE 1 % EX CREA: TOPICAL_CREAM | CUTANEOUS | 0 refills | Status: DC

## 2020-10-01 NOTE — Progress Notes (Signed)
Patient presents to the office today for diabetic shoe and insole measuring.  Patient was measured with brannock device to determine size and width for 1 pair of extra depth shoes and foam casted for 3 pair of insoles.   He has ulceration sub 1st met right which was marked for offloading.  Documentation of medical necessity will be sent to patient's treating diabetic doctor to verify and sign.   Patient's diabetic provider: Dr. Garret Reddish  Shoes and insoles will be ordered at that time and patient will be notified for an appointment for fitting when they arrive.   Shoe size (per patient): 10.5    Brannock measurement: 11 D  Patient shoe selection-   1st choice:   Apex X920M  2nd choice:  Apex V950M  Shoe size ordered: 11 wide men's

## 2020-10-07 ENCOUNTER — Telehealth: Payer: Self-pay

## 2020-10-07 DIAGNOSIS — Z992 Dependence on renal dialysis: Secondary | ICD-10-CM | POA: Diagnosis not present

## 2020-10-07 DIAGNOSIS — D509 Iron deficiency anemia, unspecified: Secondary | ICD-10-CM | POA: Diagnosis not present

## 2020-10-07 DIAGNOSIS — N186 End stage renal disease: Secondary | ICD-10-CM | POA: Diagnosis not present

## 2020-10-07 DIAGNOSIS — N2581 Secondary hyperparathyroidism of renal origin: Secondary | ICD-10-CM | POA: Diagnosis not present

## 2020-10-07 NOTE — Telephone Encounter (Signed)
-----   Message from Deboraha Sprang, MD sent at 10/05/2020  5:10 PM EDT ----- Remote reviewed. This remote is abnormal for Battery approaching ERI

## 2020-10-07 NOTE — Telephone Encounter (Signed)
Patient scheduled monthly battery checks. Attempted to call patient to advise. No answer, LMOVM.

## 2020-10-09 NOTE — Telephone Encounter (Signed)
2nd attempt to contact with no answer. LMOVM.

## 2020-10-10 DIAGNOSIS — E1129 Type 2 diabetes mellitus with other diabetic kidney complication: Secondary | ICD-10-CM | POA: Diagnosis not present

## 2020-10-10 DIAGNOSIS — Z992 Dependence on renal dialysis: Secondary | ICD-10-CM | POA: Diagnosis not present

## 2020-10-10 DIAGNOSIS — N186 End stage renal disease: Secondary | ICD-10-CM | POA: Diagnosis not present

## 2020-10-10 NOTE — Progress Notes (Signed)
  Subjective:  Patient ID: Rodney Farley, male    DOB: 02-28-1932,  MRN: [id number]  Chief Complaint  Patient presents with  . Wound Check    Right foot wound check. Pt states he does not believe wound is healing.    87 y.o. male presents for wound care. Hx as above.  Objective:  Physical Exam: Wound Location: right 1st met Wound Measurement: 0.7x0.7 Wound Base: granular. Peri-wound: Calloused Exudate: None: wound tissue dry wound without warmth, erythema, signs of acute infection Assessment:   1. Diabetic ulcer of right midfoot associated with type 2 diabetes mellitus, with fat layer exposed ([place])    Plan:  Patient was evaluated and treated and all questions answered.  Ulcer Right 1st metatarsal -Dressing applied consisting of silvadene -Offload ulcer with surgical shoe -Wound cleansed and debrided  Procedure: Selective Debridement of Wound Rationale: Removal of devitalized tissue from the wound to promote healing.  Pre-Debridement Wound Measurements: 0.7 cm x 0.7 cm x 0.2 cm  Post-Debridement Wound Measurements: same as pre-debridement. Type of Debridement: sharp selective Instrumentation: dermal curette, tissue nipper Tissue Removed: Devitalized soft-tissue Dressing: Dry, sterile, compression dressing. Disposition: Patient tolerated procedure well.    No follow-ups on file.

## 2020-10-11 ENCOUNTER — Other Ambulatory Visit: Payer: Self-pay | Admitting: Podiatry

## 2020-10-11 DIAGNOSIS — Z992 Dependence on renal dialysis: Secondary | ICD-10-CM | POA: Diagnosis not present

## 2020-10-11 DIAGNOSIS — N2581 Secondary hyperparathyroidism of renal origin: Secondary | ICD-10-CM | POA: Diagnosis not present

## 2020-10-11 DIAGNOSIS — N186 End stage renal disease: Secondary | ICD-10-CM | POA: Diagnosis not present

## 2020-10-14 DIAGNOSIS — E876 Hypokalemia: Secondary | ICD-10-CM | POA: Diagnosis not present

## 2020-10-14 DIAGNOSIS — D509 Iron deficiency anemia, unspecified: Secondary | ICD-10-CM | POA: Diagnosis not present

## 2020-10-14 DIAGNOSIS — Z992 Dependence on renal dialysis: Secondary | ICD-10-CM | POA: Diagnosis not present

## 2020-10-14 DIAGNOSIS — N186 End stage renal disease: Secondary | ICD-10-CM | POA: Diagnosis not present

## 2020-10-14 DIAGNOSIS — N2581 Secondary hyperparathyroidism of renal origin: Secondary | ICD-10-CM | POA: Diagnosis not present

## 2020-10-14 MED ORDER — GABAPENTIN 300 MG PO CAPS: ORAL_CAPSULE | ORAL | 0 refills | Status: DC

## 2020-10-14 NOTE — Telephone Encounter (Signed)
Please advise 

## 2020-10-21 DIAGNOSIS — Z992 Dependence on renal dialysis: Secondary | ICD-10-CM | POA: Diagnosis not present

## 2020-10-21 DIAGNOSIS — N186 End stage renal disease: Secondary | ICD-10-CM | POA: Diagnosis not present

## 2020-10-21 DIAGNOSIS — E1129 Type 2 diabetes mellitus with other diabetic kidney complication: Secondary | ICD-10-CM | POA: Diagnosis not present

## 2020-10-21 DIAGNOSIS — N2581 Secondary hyperparathyroidism of renal origin: Secondary | ICD-10-CM | POA: Diagnosis not present

## 2020-10-21 DIAGNOSIS — D509 Iron deficiency anemia, unspecified: Secondary | ICD-10-CM | POA: Diagnosis not present

## 2020-10-22 DIAGNOSIS — R531 Weakness: Secondary | ICD-10-CM | POA: Diagnosis not present

## 2020-10-22 DIAGNOSIS — Z9181 History of falling: Secondary | ICD-10-CM | POA: Diagnosis not present

## 2020-10-23 ENCOUNTER — Other Ambulatory Visit: Payer: Self-pay | Admitting: Family Medicine

## 2020-10-23 MED ORDER — LANSOPRAZOLE 15 MG PO CPDR: DELAYED_RELEASE_CAPSULE | ORAL | 0 refills | Status: DC

## 2020-10-25 NOTE — Telephone Encounter (Signed)
Attempted to contact patient in reference to monthly battery checks. Last remote on 09/17/2020 showed 10 months until ERI. Confirmed in [facility] that patient has automatic transmissions. Previous messages left for patient in regards to battery checks.

## 2020-10-28 DIAGNOSIS — N186 End stage renal disease: Secondary | ICD-10-CM | POA: Diagnosis not present

## 2020-10-28 DIAGNOSIS — Z992 Dependence on renal dialysis: Secondary | ICD-10-CM | POA: Diagnosis not present

## 2020-10-28 DIAGNOSIS — E876 Hypokalemia: Secondary | ICD-10-CM | POA: Diagnosis not present

## 2020-10-28 DIAGNOSIS — D631 Anemia in chronic kidney disease: Secondary | ICD-10-CM | POA: Diagnosis not present

## 2020-10-28 DIAGNOSIS — N2581 Secondary hyperparathyroidism of renal origin: Secondary | ICD-10-CM | POA: Diagnosis not present

## 2020-11-04 DIAGNOSIS — E876 Hypokalemia: Secondary | ICD-10-CM | POA: Diagnosis not present

## 2020-11-04 DIAGNOSIS — N2581 Secondary hyperparathyroidism of renal origin: Secondary | ICD-10-CM | POA: Diagnosis not present

## 2020-11-04 DIAGNOSIS — Z992 Dependence on renal dialysis: Secondary | ICD-10-CM | POA: Diagnosis not present

## 2020-11-04 DIAGNOSIS — N186 End stage renal disease: Secondary | ICD-10-CM | POA: Diagnosis not present

## 2020-11-05 ENCOUNTER — Ambulatory Visit: Payer: PPO | Admitting: Podiatry

## 2020-11-05 ENCOUNTER — Other Ambulatory Visit: Payer: Self-pay

## 2020-11-05 DIAGNOSIS — L97412 Non-pressure chronic ulcer of right heel and midfoot with fat layer exposed: Secondary | ICD-10-CM | POA: Diagnosis not present

## 2020-11-05 DIAGNOSIS — E11621 Type 2 diabetes mellitus with foot ulcer: Secondary | ICD-10-CM

## 2020-11-05 NOTE — Progress Notes (Signed)
  Subjective:  Patient ID: Rodney Farley, male    DOB: [birth year],  MRN: [id number]  Chief Complaint  Patient presents with  . Follow-up    75m f/u   87 y.o. male presents for wound care.  Thinks the wound is doing okay not noticing much difference. Objective:  Physical Exam: Wound Location: right 1st met Wound Measurement: 1x1 Wound Base: granular. Peri-wound: Calloused Exudate: None: wound tissue dry wound without warmth, erythema, signs of acute infection Assessment:   1. Diabetic ulcer of right midfoot associated with type 2 diabetes mellitus, with fat layer exposed ([place])    Plan:  Patient was evaluated and treated and all questions answered.  Ulcer Right 1st metatarsal -Offlload ulcer with DM insert.  This was further offloaded today. -Wound debrided as below -Dressed with Silvadene and Mepilex foam bandage  Procedure: Excisional Debridement of Wound Indication: Removal of non-viable soft tissue from the wound to promote healing.  Anesthesia: none Pre-Debridement Wound Measurements: 0.5 cm x 0.5 cm x 0.3 cm  Post-Debridement Wound Measurements: 0.5 cm x 0.5 cm x 0.3 cm  Type of Debridement: Sharp Excisional Tissue Removed: Non-viable soft tissue Instrumentation: 15 blade and tissue nipper Depth of Debridement: subcutaneous tissue. Technique: Sharp excisional debridement to bleeding, viable wound base.  Dressing: Dry, sterile, compression dressing. Disposition: Patient tolerated procedure well.   Return in about 6 weeks (around 12/17/2020).

## 2020-11-07 ENCOUNTER — Ambulatory Visit (INDEPENDENT_AMBULATORY_CARE_PROVIDER_SITE_OTHER): Payer: PPO

## 2020-11-07 DIAGNOSIS — I442 Atrioventricular block, complete: Secondary | ICD-10-CM

## 2020-11-07 LAB — CUP PACEART REMOTE DEVICE CHECK
Battery Remaining Longevity: 10 mo
Battery Voltage: 2.85 V
Brady Statistic AP VP Percent: 81.74 %
Brady Statistic AP VS Percent: 0.05 %
Brady Statistic AS VP Percent: 18.18 %
Brady Statistic AS VS Percent: 0.03 %
Brady Statistic RA Percent Paced: 81.68 %
Brady Statistic RV Percent Paced: 99.82 %
Date Time Interrogation Session: 20220428033423
HighPow Impedance: 76 Ohm
Implantable Lead Implant Date: 20100112
Implantable Lead Implant Date: 20170313
Implantable Lead Implant Date: 20170313
Implantable Lead Location: 753858
Implantable Lead Location: 753859
Implantable Lead Location: 753860
Implantable Lead Model: 4598
Implantable Lead Model: 5076
Implantable Pulse Generator Implant Date: 20170313
Lead Channel Impedance Value: 418 Ohm
Lead Channel Impedance Value: 418 Ohm
Lead Channel Impedance Value: 418 Ohm
Lead Channel Impedance Value: 456 Ohm
Lead Channel Impedance Value: 475 Ohm
Lead Channel Impedance Value: 513 Ohm
Lead Channel Impedance Value: 532 Ohm
Lead Channel Impedance Value: 589 Ohm
Lead Channel Impedance Value: 760 Ohm
Lead Channel Impedance Value: 779 Ohm
Lead Channel Impedance Value: 817 Ohm
Lead Channel Impedance Value: 817 Ohm
Lead Channel Impedance Value: 836 Ohm
Lead Channel Pacing Threshold Amplitude: 0.5 V
Lead Channel Pacing Threshold Amplitude: 1.375 V
Lead Channel Pacing Threshold Amplitude: 1.5 V
Lead Channel Pacing Threshold Pulse Width: 0.4 ms
Lead Channel Pacing Threshold Pulse Width: 0.4 ms
Lead Channel Pacing Threshold Pulse Width: 0.4 ms
Lead Channel Sensing Intrinsic Amplitude: 1 mV
Lead Channel Sensing Intrinsic Amplitude: 1 mV
Lead Channel Sensing Intrinsic Amplitude: 12.375 mV
Lead Channel Sensing Intrinsic Amplitude: 12.375 mV
Lead Channel Setting Pacing Amplitude: 2.5 V
Lead Channel Setting Pacing Amplitude: 3 V
Lead Channel Setting Pacing Amplitude: 3 V
Lead Channel Setting Pacing Pulse Width: 0.4 ms
Lead Channel Setting Pacing Pulse Width: 0.4 ms
Lead Channel Setting Sensing Sensitivity: 0.3 mV

## 2020-11-08 ENCOUNTER — Other Ambulatory Visit: Payer: Self-pay

## 2020-11-08 ENCOUNTER — Encounter: Payer: Self-pay | Admitting: Neurology

## 2020-11-08 ENCOUNTER — Encounter (HOSPITAL_BASED_OUTPATIENT_CLINIC_OR_DEPARTMENT_OTHER): Payer: Self-pay | Admitting: *Deleted

## 2020-11-08 ENCOUNTER — Emergency Department (HOSPITAL_BASED_OUTPATIENT_CLINIC_OR_DEPARTMENT_OTHER)
Admission: EM | Admit: 2020-11-08 | Discharge: 2020-11-08 | Disposition: A | Discharge status: Home / Self Care | Payer: PPO | Attending: Emergency Medicine | Admitting: Emergency Medicine

## 2020-11-08 ENCOUNTER — Telehealth: Payer: Self-pay

## 2020-11-08 DIAGNOSIS — S59901A Unspecified injury of right elbow, initial encounter: Secondary | ICD-10-CM | POA: Diagnosis present

## 2020-11-08 DIAGNOSIS — I132 Hypertensive heart and chronic kidney disease with heart failure and with stage 5 chronic kidney disease, or end stage renal disease: Secondary | ICD-10-CM | POA: Insufficient documentation

## 2020-11-08 DIAGNOSIS — Z992 Dependence on renal dialysis: Secondary | ICD-10-CM | POA: Insufficient documentation

## 2020-11-08 DIAGNOSIS — I5022 Chronic systolic (congestive) heart failure: Secondary | ICD-10-CM | POA: Insufficient documentation

## 2020-11-08 DIAGNOSIS — Z87891 Personal history of nicotine dependence: Secondary | ICD-10-CM | POA: Insufficient documentation

## 2020-11-08 DIAGNOSIS — W07XXXA Fall from chair, initial encounter: Secondary | ICD-10-CM | POA: Insufficient documentation

## 2020-11-08 DIAGNOSIS — J449 Chronic obstructive pulmonary disease, unspecified: Secondary | ICD-10-CM | POA: Insufficient documentation

## 2020-11-08 DIAGNOSIS — S51011A Laceration without foreign body of right elbow, initial encounter: Secondary | ICD-10-CM | POA: Diagnosis not present

## 2020-11-08 DIAGNOSIS — E1122 Type 2 diabetes mellitus with diabetic chronic kidney disease: Secondary | ICD-10-CM | POA: Insufficient documentation

## 2020-11-08 DIAGNOSIS — N184 Chronic kidney disease, stage 4 (severe): Secondary | ICD-10-CM | POA: Diagnosis not present

## 2020-11-08 DIAGNOSIS — N186 End stage renal disease: Secondary | ICD-10-CM | POA: Insufficient documentation

## 2020-11-08 NOTE — ED Triage Notes (Signed)
Fall asleep in the computer chair last night and fell with a laceration to his right elbow.

## 2020-11-08 NOTE — Telephone Encounter (Signed)
Please tell him I am sorry to hear about the fall.  Please also brainstorm with them how to prevent falls-was using a cane or walker?  If not can we make sure to use 1 of those consistently.  If fall occurred with cane would like to advance to walker.  If he is having generalized weakness we could certainly try physical therapy on his nondialysis days if he would like.

## 2020-11-08 NOTE — ED Provider Notes (Signed)
Emergency Department Provider Note   I have reviewed the triage vital signs and the nursing notes.   HISTORY  Chief Complaint Laceration   HPI Rodney Farley is a 87 y.o. male with past medical history reviewed below presents to the emergency department after sustaining a skin tear to the right elbow.  Patient states he fell asleep at his computer and fell forward scraping his elbow on the desk.  He has no pain with moving the elbow or wrist.  Bleeding is controlled.  His tetanus is up-to-date.  Denies any numbness, tingling, weakness in the extremities.  No headaches. No radiation of symptoms or modifying factors.   Past Medical History:  Diagnosis Date  . AICD (automatic cardioverter/defibrillator) present   . AKI (acute kidney injury) ([place]) 03/27/2017  . Anemia   . Anginal pain ([place])   . Anxiety   . Arm pain 05/08/2015   LEFT ARM  . Arthritis   . Atrioventricular block, complete ([place])    a. 2010 s/p pacemaker.  . Bell's palsy    left side. after shingles episode  . Bilateral renal cysts 07/23/2017   Simple and hemorrhagic noted on CT ab/pelvis   . BPH associated with nocturia   . Cardiomegaly   . Chronic systolic CHF (congestive heart failure) ([place])    EF normalized by Echo 2019  . CKD (chronic kidney disease), stage IV ([place])   . COPD (chronic obstructive pulmonary disease) (HCC)    Severe  . Coronary artery disease    a. s/p MI in 1994/1995 while in [place] s/p questionable PCI. 03/2015: progression of disease, for staged PCI.  [name] Depression   . Diabetic peripheral neuropathy ([place])   . Diarrhea   . Diverticulitis   . DOE (dyspnea on exertion) 03/28/2015  . Full dentures   . Gallstones   . GERD (gastroesophageal reflux disease)   . Gout   . Hard of hearing    B/L  . Heart murmur   . History of chronic pancreatitis 07/23/2017   noted on CT abd/pelvis  . History of shingles   . Hypercholesterolemia   . Hypertension   . Ischemic cardiomyopathy   . MI  (myocardial infarction) ([place]) 1994; 1995  . Neuropathy    IN LOWER EXTREMITIES  . Nosebleed 10/06/2017   for 2 months most recent 10/06/2017  . Obesity   . Pacemaker    medtronic>>> MDT ICD 09/23/15  . Ruptured appendicitis   . Sleep apnea    "sleeps w/humidifyer when he panics and gets short of breath" (04/08/2015)  . TIA (transient ischemic attack) X 3  . Trigger middle finger of left hand   . Type II diabetes mellitus ([place])   . Wears glasses   . Wears hearing aid     Patient Active Problem List   Diagnosis Date Noted  . 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 Dependence on renal dialysis ([place]) 11/13/2019  . Coagulation defect, unspecified ([place]) 05/11/2019  . Encounter for immunization 04/25/2019  . Thrombocytopenia ([place]) 04/13/2019  . Hypokalemia 04/12/2019  . Anaphylactic shock, unspecified, initial encounter 04/10/2019  . Anemia in chronic kidney disease 04/10/2019  . Complication of vascular dialysis catheter 04/10/2019  . Iron deficiency anemia, unspecified 04/10/2019  . Secondary hyperparathyroidism of renal origin ([place]) 04/10/2019  . Hyperkalemia, diminished renal excretion 04/01/2019  . Hyperlipidemia associated with type 2 diabetes mellitus ([place]) 04/13/2018  . S/P cholecystectomy 10/13/2017  . Diarrhea 05/11/2017  . Ruptured appendicitis 03/26/2017  .  Marital stress 02/18/2017  . Trigger finger 06/10/2016  . BPH associated with nocturia 04/27/2016  . Diabetic peripheral neuropathy ([place])   . COPD (chronic obstructive pulmonary disease) ([place])   . Bell's palsy   . Gout 06/26/2015  . Chronic systolic CHF (congestive heart failure) ([place]) 04/08/2015  . Diverticulitis 04/08/2015  . DOE (dyspnea on exertion), due to significant CAD 03/28/2015  . ESRD on hemodialysis ([place]) 03/04/2015  . CKD (chronic kidney disease) stage 4, GFR 15-29 ml/min (HCC) 03/04/2015  . CAD (coronary artery disease) 09/13/2014  . GERD (gastroesophageal reflux  disease) 08/07/2012  . Major depression, recurrent, full remission ([place]) 08/05/2012  . Sleep apnea 08/05/2012  . Controlled type 2 diabetes mellitus with chronic kidney disease on chronic dialysis ([place]) 08/05/2012  . Hypertension associated with diabetes ([place]) 08/05/2012  . Diabetes type 2, uncontrolled ([place]) 08/05/2012  . Ischemic cardiomyopathy 11/03/2011  . Atrioventricular block, complete ([place])   . Obesity   . Pacemaker     Past Surgical History:  Procedure Laterality Date  . APPENDECTOMY    . AV FISTULA PLACEMENT Right 02/07/2019   Procedure: ARTERIOVENOUS (AV) FISTULA CREATION RIGHT UPPER ARM;  Surgeon: Waynetta Sandy, MD;  Location: [facility];  Service: Vascular;  Laterality: Right;  . CARDIAC CATHETERIZATION N/A 03/29/2015   Procedure: Right/Left Heart Cath and Coronary Angiography;  Surgeon: Adrien M [name], MD;  Location: [facility];  Service: Cardiovascular;  Laterality: N/A;  . [place]   "after my MI; put me on heart RX after cath"  . CARDIAC CATHETERIZATION N/A 04/09/2015   Procedure: Coronary Stent Intervention;  Surgeon: Obert M [name], MD;  Location: [facility];  Service: Cardiovascular;  Laterality: N/A;  . CATARACT EXTRACTION W/ INTRAOCULAR LENS  IMPLANT, BILATERAL    . CHOLECYSTECTOMY N/A 10/13/2017   Procedure: LAPAROSCOPIC CHOLECYSTECTOMY WITH LYSIS OF ADHESIONS;  Surgeon: Ileana Roup, MD;  Location: [facility];  Service: General;  Laterality: N/A;  . COLONOSCOPY    . DENTAL SURGERY    . EP IMPLANTABLE DEVICE N/A 09/23/2015   MDT CRT-D, Dr. Caryl Comes  . HIATAL HERNIA REPAIR  1977  . ILEOCECETOMY N/A 03/27/2017   Procedure: ILEOCECECTOMY;  Surgeon: Ileana Roup, MD;  Location: [facility];  Service: General;  Laterality: N/A;  . INSERT / REPLACE / REMOVE PACEMAKER  07/2008   Complete heart block status post DDD with good function  . IR FLUORO GUIDE CV LINE RIGHT  04/03/2019  . IR US GUIDE VASC ACCESS RIGHT  04/03/2019  .  LAPAROTOMY N/A 03/27/2017   Procedure: EXPLORATORY LAPAROTOMY;  Surgeon: Ileana Roup, MD;  Location: [facility];  Service: General;  Laterality: N/A;  . TONSILLECTOMY    . UPPER GI ENDOSCOPY      Allergies Bee venom, Lyrica [pregabalin], Prednisone, and Zocor [simvastatin]  Family History  Problem Relation Age of Onset  . Stroke Mother   . Leukemia Father   . Stroke Sister   . Heart attack Brother     Social History Social History   Tobacco Use  . Smoking status: Former Smoker    Packs/day: 1.50    Years: 54.00    Pack years: 81.00    Types: Cigarettes    Quit date: 07/18/2007    Years since quitting: 13.3  . Smokeless tobacco: Never Used  Vaping Use  . Vaping Use: Never used  Substance Use Topics  . Alcohol use: Yes    Comment: rare  . Drug use: No  Review of Systems  Constitutional: No fever/chills Musculoskeletal: Negative for back pain. Skin: Skin tear to the right elbow. Neurological: Negative for headaches, focal weakness or numbness.  ____________________________________________   PHYSICAL EXAM:  VITAL SIGNS: ED Triage Vitals  Enc Vitals Group     BP 11/08/20 1635 (!) 147/58     Pulse Rate 11/08/20 1635 70     Resp 11/08/20 1635 16     Temp 11/08/20 1635 97.6 F (36.4 C)     Temp Source 11/08/20 1635 Oral     SpO2 11/08/20 1635 100 %     Weight 11/08/20 1631 210 lb 8.6 oz (95.5 kg)     Height 11/08/20 1631 '5\' 7"'$  (1.702 m)   Constitutional: Alert and oriented. Well appearing and in no acute distress. Eyes: Conjunctivae are normal.  Head: Atraumatic. Nose: No congestion/rhinnorhea. Mouth/Throat: Mucous membranes are moist.  Neck: No stridor.  Cardiovascular: Normal rate, regular rhythm. Good peripheral circulation. Grossly normal heart sounds.   Respiratory: Normal respiratory effort.  No retractions. Lungs CTAB. Gastrointestinal: No distention.  Musculoskeletal: No lower extremity tenderness nor edema. No gross deformities of  extremities. Normal ROM of the right elbow, shoulder, and wrist.  Neurologic:  Normal speech and language. No gross focal neurologic deficits are appreciated.  Skin:  Skin is warm and dry.  Triangular area over the right lateral elbow of skin tear.  The remaining skin is thin and denuded with no sensation.  ____________________________________________  RADIOLOGY  None  ____________________________________________   PROCEDURES  Procedure(s) performed:   Wound repair  Date/Time: 11/08/2020 5:04 PM Performed by: Margette Fast, MD Authorized by: Margette Fast, MD  Consent: Verbal consent obtained. Risks and benefits: risks, benefits and alternatives were discussed Preparation: Patient was prepped and draped in the usual sterile fashion. Local anesthesia used: no  Anesthesia: Local anesthesia used: no  Sedation: Patient sedated: no  Patient tolerance: patient tolerated the procedure well with no immediate complications Comments: The denuded area of skin was excised without local anesthesia.  Patient tolerated this without pain.  Minimal bleeding.  The wound was cleaned with Betadine and wrapped with a Xeroform, nonstick dressing.  Instructions given to son at bedside regarding further dressing changes.      ____________________________________________   INITIAL IMPRESSION / ASSESSMENT AND PLAN / ED COURSE  Pertinent labs & imaging results that were available during my care of the patient were reviewed by me and considered in my medical decision making (see chart for details).   Patient presents to the emergency department with a skin tear to the right elbow.  This wound is many hours old and is more of a skin tear than laceration.  The skin is denuded as outlined above.  This was excised and the wound was cleaned and dressed.  Plan to continue dressing changes at home over the next week.  No bony tenderness, range of motion limitation, other findings to suspect underlying  fracture. Tetanus is UTD.    ____________________________________________  FINAL CLINICAL IMPRESSION(S) / ED DIAGNOSES  Final diagnoses:  Skin tear of right elbow without complication, initial encounter    Note:  This document was prepared using Dragon voice recognition software and may include unintentional dictation errors.  Nanda Quinton, MD, [facility]    Vonita Calloway, [facility], MD 11/08/20 [phone]

## 2020-11-08 NOTE — Telephone Encounter (Signed)
FYI

## 2020-11-08 NOTE — ED Notes (Signed)
ED Provider at bedside. 

## 2020-11-08 NOTE — ED Notes (Signed)
RESTRICTIONS BAND and FALL RISK BAND placed on pt's right wrist.

## 2020-11-08 NOTE — Discharge Instructions (Signed)
You were seen in the emergency department today with a skin tear to the right elbow.  I placed a nonstick dressing on this which she will keep in place for 24 hours.  You can then change the dressing every 24 hours for the next week.  Once you remove my dressing you may place a small amount of Neosporin on the area of healing skin.  You can cover this with gauze and wrap gently around the arm.

## 2020-11-08 NOTE — Telephone Encounter (Signed)
Pt son called stating pt fell yesterday and skinned up his arm. Son states pt is needing wound care and asked if the pt could be seen today after 4. I told the son there were not any appointments available. Son stated he was going to take pt to an urgent care to have the wound looked at, but he wanted me to make Dr. Yong Channel aware of the fall. Please advise.

## 2020-11-09 DIAGNOSIS — N186 End stage renal disease: Secondary | ICD-10-CM | POA: Diagnosis not present

## 2020-11-09 DIAGNOSIS — Z992 Dependence on renal dialysis: Secondary | ICD-10-CM | POA: Diagnosis not present

## 2020-11-09 DIAGNOSIS — E1129 Type 2 diabetes mellitus with other diabetic kidney complication: Secondary | ICD-10-CM | POA: Diagnosis not present

## 2020-11-11 DIAGNOSIS — Z992 Dependence on renal dialysis: Secondary | ICD-10-CM | POA: Diagnosis not present

## 2020-11-11 DIAGNOSIS — N186 End stage renal disease: Secondary | ICD-10-CM | POA: Diagnosis not present

## 2020-11-11 DIAGNOSIS — E876 Hypokalemia: Secondary | ICD-10-CM | POA: Diagnosis not present

## 2020-11-11 DIAGNOSIS — N2581 Secondary hyperparathyroidism of renal origin: Secondary | ICD-10-CM | POA: Diagnosis not present

## 2020-11-11 NOTE — Telephone Encounter (Signed)
Patients son states that his father was sitting at his computer desk, he fell asleep and he fell out of the chair. They were seen at a [place] facility where they wrapped his arm at the site of the injury. Patient son states that his father is doing well. This happened 11/07/2020. Patients son states that the provider he seen stated that he needed to be seen by you within a week. I am going to get him scheduled for a same day appointment this week.

## 2020-11-11 NOTE — Telephone Encounter (Signed)
Unable to reach patient. LMTRC  

## 2020-11-14 NOTE — Patient Instructions (Addendum)
The wound appears to be healing but slowly- continue to change dressing daily using a nonstick dressing.  If you get redness, warmth, pain at the lesion or around the wound please let us know  Increase carvedilol back to 6.25 mg unless you begin to have any more symptoms of lightheadedness again-if that happens please let us know.  I would like to keep blood pressure at least below 150 and prefer under 140 if possible some good

## 2020-11-14 NOTE — Progress Notes (Signed)
Phone [phone] In person visit   Subjective:   Rodney Farley is a 87 y.o. year old very pleasant male patient who presents for/with See problem oriented charting Chief Complaint  Patient presents with  . Follow-up    Right elbow injury. Patient fell.      This visit occurred during the SARS-CoV-2 public health emergency.  Safety protocols were in place, including screening questions prior to the visit, additional usage of staff PPE, and extensive cleaning of exam room while observing appropriate contact time as indicated for disinfecting solutions.   Past Medical History-  Patient Active Problem List   Diagnosis Date Noted  . Ruptured appendicitis 03/26/2017    Priority: High  . Marital stress 02/18/2017    Priority: High  . COPD (chronic obstructive pulmonary disease) ([place])     Priority: High  . Chronic systolic CHF (congestive heart failure) ([place]) 04/08/2015    Priority: High  . DOE (dyspnea on exertion), due to significant CAD 03/28/2015    Priority: High  . ESRD on hemodialysis ([place]) 03/04/2015    Priority: High  . CAD (coronary artery disease) 09/13/2014    Priority: High  . Major depression, recurrent, full remission ([place]) 08/05/2012    Priority: High  . Controlled type 2 diabetes mellitus with chronic kidney disease on chronic dialysis ([place]) 08/05/2012    Priority: High  . Ischemic cardiomyopathy 11/03/2011    Priority: High  . Atrioventricular block, complete (HCC)     Priority: High  . Pacemaker     Priority: High  . Hyperlipidemia associated with type 2 diabetes mellitus ([place]) 04/13/2018    Priority: Medium  . BPH associated with nocturia 04/27/2016    Priority: Medium  . Diabetic peripheral neuropathy (HCC)     Priority: Medium  . Bell's palsy     Priority: Medium  . Gout 06/26/2015    Priority: Medium  . Sleep apnea 08/05/2012    Priority: Medium  . Hypertension associated with diabetes ([place]) 08/05/2012    Priority: Medium  . S/P  cholecystectomy 10/13/2017    Priority: Low  . Trigger finger 06/10/2016    Priority: Low  . Diverticulitis 04/08/2015    Priority: Low  . GERD (gastroesophageal reflux disease) 08/07/2012    Priority: Low  . Obesity     Priority: Low  . 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 Dependence on renal dialysis ([place]) 11/13/2019  . Coagulation defect, unspecified ([place]) 05/11/2019  . Encounter for immunization 04/25/2019  . Thrombocytopenia ([place]) 04/13/2019  . Hypokalemia 04/12/2019  . Anaphylactic shock, unspecified, initial encounter 04/10/2019  . Anemia in chronic kidney disease 04/10/2019  . Complication of vascular dialysis catheter 04/10/2019  . Iron deficiency anemia, unspecified 04/10/2019  . Secondary hyperparathyroidism of renal origin ([place]) 04/10/2019  . Hyperkalemia, diminished renal excretion 04/01/2019  . Diarrhea 05/11/2017  . CKD (chronic kidney disease) stage 4, GFR 15-29 ml/min (HCC) 03/04/2015  . Diabetes type 2, uncontrolled ([place]) 08/05/2012    Medications- reviewed and updated Current Outpatient Medications  Medication Sig Dispense Refill  . aspirin EC 81 MG tablet Take 81 mg by mouth daily.    [name] atorvastatin (LIPITOR) 40 MG tablet Take 1 tablet by mouth once daily 90 tablet 3  . Carboxymethylcellul-Glycerin (LUBRICATING EYE DROPS OP) Place 1 drop into both eyes 3 (three) times daily as needed (dry eyes).    . ciclopirox (PENLAC) 8 % solution     . erythromycin ophthalmic ointment Place 1 application into the  left eye at bedtime.    . gabapentin (NEURONTIN) 300 MG capsule TAKE 1 CAPSULE BY MOUTH AT BEDTIME FOR 7 DAYS, THEN TAKE 2 CAPSULES DAILY - MORNING AND BEDTIME THEREAFTER 90 capsule 0  . lansoprazole (PREVACID) 15 MG capsule Take 1 tablet (40m) daily 90 capsule 0  . Methoxy PEG-Epoetin Beta (MIRCERA IJ) Mircera    . mirtazapine (REMERON) 7.5 MG tablet Take 0.5-1 tablets (3.75-7.5 mg total) by mouth at bedtime. 30 tablet 5  .  nitroGLYCERIN (NITROSTAT) 0.4 MG SL tablet Place 1 tablet (0.4 mg total) under the tongue every 5 (five) minutes as needed for chest pain. 2 doses before calling for emergent help 25 tablet 3  . oxymetazoline (AFRIN) 0.05 % nasal spray Place 1 spray into both nostrils 2 (two) times daily as needed for congestion.    . saccharomyces boulardii (FLORASTOR) 250 MG capsule Take 1 capsule (250 mg total) by mouth 2 (two) times daily. 30 capsule 0  . sertraline (ZOLOFT) 100 MG tablet Take 1.5 tablets (150 mg total) by mouth daily. 135 tablet 3  . sevelamer carbonate (RENVELA) 800 MG tablet Take 1,600 mg by mouth 3 (three) times daily.    . sevelamer carbonate (RENVELA) 800 MG tablet Take 1,600 mg by mouth in the morning and at bedtime.     . triamcinolone cream (KENALOG) 0.1 %     . vitamin B-12 (CYANOCOBALAMIN) 1000 MCG tablet Take 1,000 mcg by mouth daily.    . carvedilol (COREG) 6.25 MG tablet Take 1 tablet (6.25 mg total) by mouth 2 (two) times daily with a meal. Hold on mornings you have dialysis and the night before 180 tablet 3  . glucose blood (FREESTYLE TEST STRIPS) test strip Use to check blood sugar daily (Patient not taking: No sig reported) 100 each 3  . glucose monitoring kit (FREESTYLE) monitoring kit USE TO MONITOR BLOOD GLUCOSE AS DIRECTED (Patient not taking: No sig reported) 1 each 1  . midodrine (PROAMATINE) 10 MG tablet Take by mouth. (Patient not taking: Reported on 11/15/2020)    . silver sulfADIAZINE (SILVADENE) 1 % cream Apply pea-sized amount to wound daily. 50 g 0  . SPS 15 GM/60ML suspension      No current facility-administered medications for this visit.     Objective:  BP (!) 148/70   Pulse 72   Temp 98.2 F (36.8 C) (Temporal)   Ht '5\' 8"'  (1.727 m)   Wt 215 lb (97.5 kg)   SpO2 98%   BMI 32.69 kg/m  Gen: NAD, resting comfortably CV: Regular heart rate Lungs: CTAB no crackles, wheeze, rhonchi Ext: Trace edema Skin: warm, dry, triangular skin tear over right elbow  without surrounding erythema with each edge of triangle around 2.5 cm    Assessment and Plan  ER F/U ([place]) S: Patient fell asleep at his computer and fell out of his chair at home sustaining a skin tear on right elbow.  Denies syncope or hitting his head.  No reported headaches A/P: Skin tear on right elbow-appears to be healing well without signs of infection.  We discussed changing dressing daily after cleansing-soapy water is fine  # Diabetes S: Medication: currently diet controlled. Has required insulin at times in past.  Lab Results  Component Value Date   HGBA1C 6.7 10/27/2019   HGBA1C 5.7 (H) 04/02/2019   HGBA1C 5.3 08/03/2018  A/P: A1c of 6.3 in January with dialysis-asked team to abstract this.  We will continue with diet control alone  #hypertension S:  medication: Carvedilol 3.125 mg twice daily (down from 12.5 mg BID).  Hold on mornings of dialysis as well as evening before dialysis -had been on medicine for orthostatic hypotension midodrine but was taken off as BP running higher Even with coming off of midodrine-Home readings #s: usually 150s and at dialysis before starting 150s A/P: Patient has noted upward trend of blood pressure/poorly-controlled- we opted to increase carvedilol back to 6.25 mg twice daily we will still hold on mornings of dialysis as well as evening before dialysis  I would like to keep most of his blood pressures under 150- ideally under140 as long as no orthostatic symptoms  Recommended follow up: As needed for acute concern-otherwise I would like to see him at least every 6 months-sooner if blood pressure does not trend back down- Future Appointments  Date Time Provider [place]  12/03/2020  3:15 PM Evelina Bucy, DPM [facility]  12/31/2020  2:00 PM Deboraha Sprang, MD [facility]  01/09/2021  7:00 AM [facility] DEVICE REMOTES [facility]  02/06/2021  9:10 AM Posey Pronto, Arvin Collard K, DO [facility] None  02/10/2021   7:00 AM [facility] DEVICE REMOTES [facility]  03/13/2021  7:00 AM [facility] DEVICE REMOTES [facility]  04/14/2021  7:00 AM [facility] DEVICE REMOTES [facility]  05/15/2021  7:00 AM [facility] DEVICE REMOTES [facility]  06/16/2021  7:00 AM [facility] DEVICE REMOTES [facility]  07/17/2021  7:00 AM [facility] DEVICE REMOTES [facility]  08/18/2021  7:00 AM [facility] DEVICE REMOTES [facility]  09/18/2021  7:00 AM [facility] DEVICE REMOTES [facility]  10/20/2021  7:00 AM [facility] DEVICE REMOTES [facility]    Lab/Order associations:   ICD-10-CM   1. Hypertension associated with diabetes ([place])  E11.59    I15.2   2. Controlled type 2 diabetes mellitus with chronic kidney disease on chronic dialysis, without long-term current use of insulin (HCC)  E11.22    N18.6    Z99.2   3. Skin tear of right elbow without complication, initial encounter  S51.011A     Meds ordered this encounter  Medications  . carvedilol (COREG) 6.25 MG tablet    Sig: Take 1 tablet (6.25 mg total) by mouth 2 (two) times daily with a meal. Hold on mornings you have dialysis and the night before    Dispense:  180 tablet    Refill:  3   Return precautions advised.  Garret Reddish, MD

## 2020-11-15 ENCOUNTER — Other Ambulatory Visit: Payer: Self-pay

## 2020-11-15 ENCOUNTER — Ambulatory Visit (INDEPENDENT_AMBULATORY_CARE_PROVIDER_SITE_OTHER): Payer: PPO | Admitting: Family Medicine

## 2020-11-15 ENCOUNTER — Encounter: Payer: Self-pay | Admitting: Family Medicine

## 2020-11-15 VITALS — BP 148/70 | HR 72 | Temp 98.2°F | Ht 68.0 in | Wt 215.0 lb

## 2020-11-15 DIAGNOSIS — E1122 Type 2 diabetes mellitus with diabetic chronic kidney disease: Secondary | ICD-10-CM | POA: Diagnosis not present

## 2020-11-15 DIAGNOSIS — I152 Hypertension secondary to endocrine disorders: Secondary | ICD-10-CM

## 2020-11-15 DIAGNOSIS — S51011A Laceration without foreign body of right elbow, initial encounter: Secondary | ICD-10-CM

## 2020-11-15 DIAGNOSIS — N186 End stage renal disease: Secondary | ICD-10-CM

## 2020-11-15 DIAGNOSIS — Z992 Dependence on renal dialysis: Secondary | ICD-10-CM | POA: Diagnosis not present

## 2020-11-15 DIAGNOSIS — E1159 Type 2 diabetes mellitus with other circulatory complications: Secondary | ICD-10-CM | POA: Diagnosis not present

## 2020-11-15 MED ORDER — CARVEDILOL 6.25 MG PO TABS: 6.2500 mg | ORAL_TABLET | Freq: Two times a day (BID) | ORAL | 3 refills | Status: DC

## 2020-11-18 DIAGNOSIS — N2581 Secondary hyperparathyroidism of renal origin: Secondary | ICD-10-CM | POA: Diagnosis not present

## 2020-11-18 DIAGNOSIS — Z992 Dependence on renal dialysis: Secondary | ICD-10-CM | POA: Diagnosis not present

## 2020-11-18 DIAGNOSIS — N186 End stage renal disease: Secondary | ICD-10-CM | POA: Diagnosis not present

## 2020-11-19 ENCOUNTER — Ambulatory Visit: Payer: PPO | Attending: Podiatry

## 2020-11-19 ENCOUNTER — Other Ambulatory Visit: Payer: Self-pay

## 2020-11-19 DIAGNOSIS — M6281 Muscle weakness (generalized): Secondary | ICD-10-CM | POA: Diagnosis not present

## 2020-11-19 DIAGNOSIS — R2689 Other abnormalities of gait and mobility: Secondary | ICD-10-CM | POA: Insufficient documentation

## 2020-11-19 NOTE — Patient Instructions (Signed)
Access Code: [facility] URL: [email or web address] Date: 11/19/2020 Prepared by: Claiborne Billings  Exercises Seated Long Arc Quad - 3 x daily - 7 x weekly - 2 sets - 10 reps - 5 hold Seated March - 3 x daily - 7 x weekly - 3 sets - 10 reps Seated Heel Toe Raises - 3 x daily - 7 x weekly - 2 sets - 10 reps Seated Isometric Hip Adduction with Ball - 3 x daily - 7 x weekly - 2 sets - 10 reps

## 2020-11-19 NOTE — Therapy (Signed)
[facility] [address], [place], [place], [address] Phone: [phone]   Fax:  [phone]  Physical Therapy Evaluation  Patient Details  Name: Rodney Farley MRN: [id number] Date of Birth: [birth year] Referring Provider (PT): Hardie Pulley, MD   Encounter Date: 11/19/2020   PT End of Session - 11/19/20 1319    Visit Number 1    Date for PT Re-Evaluation 01/14/21    Authorization Type [employer]    Progress Note Due on Visit 10    PT Start Time 1232    PT Stop Time 1310    PT Time Calculation (min) 38 min    Activity Tolerance Patient tolerated treatment well    Behavior During Therapy [facility] for tasks assessed/performed           Past Medical History:  Diagnosis Date  . AICD (automatic cardioverter/defibrillator) present   . AKI (acute kidney injury) ([place]) 03/27/2017  . Anemia   . Anginal pain ([place])   . Anxiety   . Arm pain 05/08/2015   LEFT ARM  . Arthritis   . Atrioventricular block, complete ([place])    a. 2010 s/p pacemaker.  . Bell's palsy    left side. after shingles episode  . Bilateral renal cysts 07/23/2017   Simple and hemorrhagic noted on CT ab/pelvis   . BPH associated with nocturia   . Cardiomegaly   . Chronic systolic CHF (congestive heart failure) ([place])    EF normalized by Echo 2019  . CKD (chronic kidney disease), stage IV ([place])   . COPD (chronic obstructive pulmonary disease) (H[facility])    Severe  . Coronary artery disease    a. s/p MI in 1994/1995 while in [place] s/p questionable PCI. 03/2015: progression of disease, for staged PCI.  [name] Depression   . Diabetic peripheral neuropathy ([place])   . Diarrhea   . Diverticulitis   . DOE (dyspnea on exertion) 03/28/2015  . Full dentures   . Gallstones   . GERD (gastroesophageal reflux disease)   . Gout   . Hard of hearing    B/L  . Heart murmur   . History of chronic pancreatitis 07/23/2017   noted on CT abd/pelvis  . History  of shingles   . Hypercholesterolemia   . Hypertension   . Ischemic cardiomyopathy   . MI (myocardial infarction) ([place]) 1994; 1995  . Neuropathy    IN LOWER EXTREMITIES  . Nosebleed 10/06/2017   for 2 months most recent 10/06/2017  . Obesity   . Pacemaker    medtronic>>> MDT ICD 09/23/15  . Ruptured appendicitis   . Sleep apnea    "sleeps w/humidifyer when he panics and gets short of breath" (04/08/2015)  . TIA (transient ischemic attack) X 3  . Trigger middle finger of left hand   . Type II diabetes mellitus ([place])   . Wears glasses   . Wears hearing aid     Past Surgical History:  Procedure Laterality Date  . APPENDECTOMY    . AV FISTULA PLACEMENT Right 02/07/2019   Procedure: ARTERIOVENOUS (AV) FISTULA CREATION RIGHT UPPER ARM;  Surgeon: Waynetta Sandy, MD;  Location: [place];  Service: Vascular;  Laterality: Right;  . CARDIAC CATHETERIZATION N/A 03/29/2015   Procedure: Right/Left Heart Cath and Coronary Angiography;  Surgeon: Horacio M [name], MD;  Location: [place];  Service: Cardiovascular;  Laterality: N/A;  . [place]   "after my MI; put me on heart RX after  cath"  . CARDIAC CATHETERIZATION N/A 04/09/2015   Procedure: Coronary Stent Intervention;  Surgeon: Huber M [name], MD;  Location: [place] [facility];  Service: Cardiovascular;  Laterality: N/A;  . CATARACT EXTRACTION W/ INTRAOCULAR LENS  IMPLANT, BILATERAL    . CHOLECYSTECTOMY N/A 10/13/2017   Procedure: LAPAROSCOPIC CHOLECYSTECTOMY WITH LYSIS OF ADHESIONS;  Surgeon: Ileana Roup, MD;  Location: [facility];  Service: General;  Laterality: N/A;  . COLONOSCOPY    . DENTAL SURGERY    . EP IMPLANTABLE DEVICE N/A 09/23/2015   MDT CRT-D, Dr. Caryl Comes  . HIATAL HERNIA REPAIR  1977  . ILEOCECETOMY N/A 03/27/2017   Procedure: ILEOCECECTOMY;  Surgeon: Ileana Roup, MD;  Location: [place];  Service: General;  Laterality: N/A;  . INSERT / REPLACE / REMOVE PACEMAKER  07/2008   Complete  heart block status post DDD with good function  . IR FLUORO GUIDE CV LINE RIGHT  04/03/2019  . IR US GUIDE VASC ACCESS RIGHT  04/03/2019  . LAPAROTOMY N/A 03/27/2017   Procedure: EXPLORATORY LAPAROTOMY;  Surgeon: Ileana Roup, MD;  Location: [facility];  Service: General;  Laterality: N/A;  . TONSILLECTOMY    . UPPER GI ENDOSCOPY      There were no vitals filed for this visit.    Subjective Assessment - 11/19/20 1236    Subjective I used to be able to get up quite easily and I have lost the strength.  My left leg is weaker.  I had a fall out of a chair and pulled the skin off of my arm.  I started using a walker 4 weeks ago due to a fall.    Pertinent History COPD, CHF, CAD, pacemaker, dialysis 3x/wk.  Pt is hard of hearing, peripheral neuropathy    Limitations Standing;Walking    How long can you walk comfortably? using walker/Rollator: limited to < 5 minutes    Patient Stated Goals improve gait, stability, improve endurance for walking    Currently in Pain? No/denies              [facility] PT Assessment - 11/19/20 0001      Assessment   Medical Diagnosis gait disturbance, at high risk for injury related to fall    Referring Provider (PT) Hardie Pulley, MD    Onset Date/Surgical Date --   chronic   Next MD Visit none    Prior Therapy none      Precautions   Precautions Fall      Restrictions   Weight Bearing Restrictions No      Balance Screen   Has the patient fallen in the past 6 months Yes    How many times? 4-5    Has the patient had a decrease in activity level because of a fear of falling?  Yes    Is the patient reluctant to leave their home because of a fear of falling?  No      Home Environment   Living Environment Private residence    Living Arrangements Alone    Type of [place] One level    [place] - 2 wheels;Hospital bed;Cane - single point;Grab bars - toilet;Grab bars - tub/shower      Prior Function   Level of  Independence Requires assistive device for independence    Vocation Retired      Associate Professor   Overall Cognitive Status Within Functional Limits for tasks assessed      Posture/Postural Control  Posture/Postural Control Postural limitations    Postural Limitations Flexed trunk;Rounded Shoulders;Forward head      ROM / Strength   AROM / PROM / Strength Strength;PROM      PROM   Overall PROM  Deficits    Overall PROM Comments hamstring and hip flexibility limited by 25-50%      Strength   Overall Strength Deficits    Overall Strength Comments Lt hip 4-/5, Rt hip 4/5, Rt knee 5/5, Lt knee 4/5, ankle DF 4+/5 bil      Transfers   Transfers Stand to Sit;Sit to Stand    Sit to Stand 4: Min guard;With upper extremity assist    Stand to Sit 4: Min guard;With armrests;With upper extremity assist      Ambulation/Gait   Ambulation/Gait Yes    Ambulation/Gait Assistance 5: Supervision    Assistive device Rolling walker    Gait Pattern Step-through pattern;Decreased stride length    Stairs No      Balance   Balance Assessed Yes      Standardized Balance Assessment   Standardized Balance Assessment Timed Up and Go Test      Timed Up and Go Test   TUG Normal TUG    Normal TUG (seconds) 33.7    TUG Comments using walker                      Objective measurements completed on examination: See above findings.               PT Education - 11/19/20 1304    Education Details Access Code: [facility]    Person(s) Educated Patient    Methods Explanation;Demonstration;Handout    Comprehension Verbalized understanding;Returned demonstration            PT Short Term Goals - 11/19/20 1240      PT SHORT TERM GOAL #1   Title be independent in initial HEP    Time 4    Period Weeks    Status New    Target Date 12/17/20      PT SHORT TERM GOAL #2   Title perform TUG in < or = to 27 seconds to improve mobility and improve balance    Time 4    Period Weeks     Status New    Target Date 12/17/20      PT SHORT TERM GOAL #3   Title improve LE strength to perform sit to stand with moderate UE support and maintain stability upon standing    Time 4    Period Weeks    Status New    Target Date 12/17/20             PT Long Term Goals - 11/19/20 1229      PT LONG TERM GOAL #1   Title be independent in advanced HEP    Time 8    Period Weeks    Status New    Target Date 01/14/21      PT LONG TERM GOAL #2   Title perform TUG in < or = to 23 seconds to improve mobility and reduce falls risk    Time 8    Period Weeks    Status New    Target Date 01/14/21      PT LONG TERM GOAL #3   Title improve LE strength to walk for 3 minutes in the clinic without need for seated rest break    Time 8  Period Weeks    Status New    Target Date 01/14/21      PT LONG TERM GOAL #4   Title demonstrate 4/5 Lt hip strength to improve safety with transitional movements    Time 8    Period Weeks    Status New    Target Date 01/14/21      PT LONG TERM GOAL #5   Title perform sit to stand with moderate UE support 2 times consecutively and maintain balance upon standing    Time 8    Period Weeks    Status New    Target Date 01/14/21                  Plan - 11/19/20 1324    Clinical Impression Statement Pt presents to PT with gait and mobility deficits and has had a recent fall.  Pt ambulates with a rolling walker for all distances. Pt has a complex medical history including dialysis 3x/wk, COPD, pacemaker, and Bell's Palsy resulting in Lt LE and facial weakness.  Pt demonstrates max UE support and unsafe technique with instability with sit to stand and stand to sit transition.  Pt with reduced hip and knee strength Lt>Rt, unsteady gait pattern especially with change of direction and falls risk as evident by TUG time of 33.70 seconds with use of walker.  Pt lives alone and has had 4-5 falls in the past 6 months. Pt will benefit from skilled PT to  improve functional strength, mobility and improve balance and gait to improve safety at home and in the community.    Personal Factors and Comorbidities Comorbidity 3+;Time since onset of injury/illness/exacerbation;Age    Comorbidities dialysis 3x/wk, pacemaker, COPD, CHF, falls    Examination-Activity Limitations Bed Mobility;Carry;Locomotion Level;Squat;Stairs;Stand;Transfers    Examination-Participation Restrictions Community Activity;Driving;Meal Prep;Shop    Stability/Clinical Decision Making Evolving/Moderate complexity    Clinical Decision Making Moderate    Rehab Potential Good    PT Frequency 2x / week    PT Duration 8 weeks    PT Treatment/Interventions ADLs/Self Care Home Management;Gait training;Stair training;Functional mobility training;Therapeutic activities;Therapeutic exercise;Balance training;Neuromuscular re-education;Patient/family education;Manual techniques;Passive range of motion    PT Next Visit Plan work on sit to stand from elevated surface, review HEP, short distance ambulation, NuStep    PT Home Exercise Plan Access Code: [facility]    Consulted and Agree with Plan of Care Patient           Patient will benefit from skilled therapeutic intervention in order to improve the following deficits and impairments:  Abnormal gait,Cardiopulmonary status limiting activity,Decreased activity tolerance,Decreased strength,Postural dysfunction,Decreased range of motion,Hypomobility,Difficulty walking,Decreased mobility,Decreased balance,Decreased endurance  Visit Diagnosis: Muscle weakness (generalized) - Plan: PT plan of care cert/re-cert  Other abnormalities of gait and mobility - Plan: PT plan of care cert/re-cert     Problem List Patient Active Problem List   Diagnosis Date Noted  . 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 Dependence on renal dialysis ([place]) 11/13/2019  . Coagulation defect, unspecified ([place]) 05/11/2019  . Encounter  for immunization 04/25/2019  . Thrombocytopenia ([place]) 04/13/2019  . Hypokalemia 04/12/2019  . Anaphylactic shock, unspecified, initial encounter 04/10/2019  . Anemia in chronic kidney disease 04/10/2019  . Complication of vascular dialysis catheter 04/10/2019  . Iron deficiency anemia, unspecified 04/10/2019  . Secondary hyperparathyroidism of renal origin ([place]) 04/10/2019  . Hyperkalemia, diminished renal excretion 04/01/2019  . Hyperlipidemia associated with type 2 diabetes mellitus ([place]) 04/13/2018  . S/P cholecystectomy  10/13/2017  . Diarrhea 05/11/2017  . Ruptured appendicitis 03/26/2017  . Marital stress 02/18/2017  . Trigger finger 06/10/2016  . BPH associated with nocturia 04/27/2016  . Diabetic peripheral neuropathy ([place])   . COPD (chronic obstructive pulmonary disease) ([place])   . Bell's palsy   . Gout 06/26/2015  . Chronic systolic CHF (congestive heart failure) ([place]) 04/08/2015  . Diverticulitis 04/08/2015  . DOE (dyspnea on exertion), due to significant CAD 03/28/2015  . ESRD on hemodialysis ([place]) 03/04/2015  . CKD (chronic kidney disease) stage 4, GFR 15-29 ml/min ([facility]) 03/04/2015  . CAD (coronary artery disease) 09/13/2014  . GERD (gastroesophageal reflux disease) 08/07/2012  . Major depression, recurrent, full remission ([place]) 08/05/2012  . Sleep apnea 08/05/2012  . Controlled type 2 diabetes mellitus with chronic kidney disease on chronic dialysis ([place]) 08/05/2012  . Hypertension associated with diabetes ([place]) 08/05/2012  . Diabetes type 2, uncontrolled ([place]) 08/05/2012  . Ischemic cardiomyopathy 11/03/2011  . Atrioventricular block, complete ([place])   . Obesity   . Pacemaker      Sigurd Sos, PT 11/19/20 1:28 PM  [facility] [address] [place], [place], [address] Phone: [phone]   Fax:  [phone]  Name: Rodney Farley MRN: [id number] Date of Birth: 1931/12/18

## 2020-11-21 ENCOUNTER — Ambulatory Visit: Payer: PPO | Admitting: Physical Therapy

## 2020-11-21 ENCOUNTER — Other Ambulatory Visit: Payer: Self-pay

## 2020-11-21 DIAGNOSIS — M6281 Muscle weakness (generalized): Secondary | ICD-10-CM

## 2020-11-21 DIAGNOSIS — Z9181 History of falling: Secondary | ICD-10-CM | POA: Diagnosis not present

## 2020-11-21 DIAGNOSIS — R531 Weakness: Secondary | ICD-10-CM | POA: Diagnosis not present

## 2020-11-21 DIAGNOSIS — R2689 Other abnormalities of gait and mobility: Secondary | ICD-10-CM

## 2020-11-21 NOTE — Therapy (Signed)
[facility] [address] [place], [place], [address] Phone: [phone]   Fax:  [phone]  Physical Therapy Treatment  Patient Details  Name: Rodney Farley MRN: [id number] Date of Birth: 1931/09/13 Referring Provider (PT): Hardie Pulley, MD   Encounter Date: 11/21/2020   PT End of Session - 11/21/20 1241    Visit Number 2    Date for PT Re-Evaluation 01/14/21    Authorization Type [employer]    Progress Note Due on Visit 10    PT Start Time 1100    PT Stop Time 1145    PT Time Calculation (min) 45 min    Activity Tolerance Patient tolerated treatment well           Past Medical History:  Diagnosis Date  . AICD (automatic cardioverter/defibrillator) present   . AKI (acute kidney injury) ([place]) 03/27/2017  . Anemia   . Anginal pain ([place])   . Anxiety   . Arm pain 05/08/2015   LEFT ARM  . Arthritis   . Atrioventricular block, complete ([place])    a. 2010 s/p pacemaker.  . Bell's palsy    left side. after shingles episode  . Bilateral renal cysts 07/23/2017   Simple and hemorrhagic noted on CT ab/pelvis   . BPH associated with nocturia   . Cardiomegaly   . Chronic systolic CHF (congestive heart failure) ([place])    EF normalized by Echo 2019  . CKD (chronic kidney disease), stage IV ([place])   . COPD (chronic obstructive pulmonary disease) (HCC)    Severe  . Coronary artery disease    a. s/p MI in 1994/1995 while in [place] s/p questionable PCI. 03/2015: progression of disease, for staged PCI.  [name] Depression   . Diabetic peripheral neuropathy ([place])   . Diarrhea   . Diverticulitis   . DOE (dyspnea on exertion) 03/28/2015  . Full dentures   . Gallstones   . GERD (gastroesophageal reflux disease)   . Gout   . Hard of hearing    B/L  . Heart murmur   . History of chronic pancreatitis 07/23/2017   noted on CT abd/pelvis  . History of shingles   . Hypercholesterolemia   . Hypertension   .  Ischemic cardiomyopathy   . MI (myocardial infarction) ([place]) 1994; 1995  . Neuropathy    IN LOWER EXTREMITIES  . Nosebleed 10/06/2017   for 2 months most recent 10/06/2017  . Obesity   . Pacemaker    medtronic>>> MDT ICD 09/23/15  . Ruptured appendicitis   . Sleep apnea    "sleeps w/humidifyer when he panics and gets short of breath" (04/08/2015)  . TIA (transient ischemic attack) X 3  . Trigger middle finger of left hand   . Type II diabetes mellitus (Rodney Farley)   . Wears glasses   . Wears hearing aid     Past Surgical History:  Procedure Laterality Date  . APPENDECTOMY    . AV FISTULA PLACEMENT Right 02/07/2019   Procedure: ARTERIOVENOUS (AV) FISTULA CREATION RIGHT UPPER ARM;  Surgeon: Waynetta Sandy, MD;  Location: [place];  Service: Vascular;  Laterality: Right;  . CARDIAC CATHETERIZATION N/A 03/29/2015   Procedure: Right/Left Heart Cath and Coronary Angiography;  Surgeon: Colonel M [name], MD;  Location: [facility];  Service: Cardiovascular;  Laterality: N/A;  . [place]   "after my MI; put me on heart RX after cath"  . CARDIAC CATHETERIZATION N/A 04/09/2015   Procedure:  Coronary Stent Intervention;  Surgeon: Bryston M [name], MD;  Location: [facility];  Service: Cardiovascular;  Laterality: N/A;  . CATARACT EXTRACTION W/ INTRAOCULAR LENS  IMPLANT, BILATERAL    . CHOLECYSTECTOMY N/A 10/13/2017   Procedure: LAPAROSCOPIC CHOLECYSTECTOMY WITH LYSIS OF ADHESIONS;  Surgeon: Ileana Roup, MD;  [facility];  Service: General;  Laterality: N/A;  . COLONOSCOPY    . DENTAL SURGERY    . EP IMPLANTABLE DEVICE N/A 09/23/2015   MDT CRT-D, Dr. Caryl Comes  . HIATAL HERNIA REPAIR  1977  . ILEOCECETOMY N/A 03/27/2017   Procedure: ILEOCECECTOMY;  Surgeon: Ileana Roup, MD;  Location: [facility];  Service: General;  Laterality: N/A;  . INSERT / REPLACE / REMOVE PACEMAKER  07/2008   Complete heart block status post DDD with good function  . IR FLUORO  GUIDE CV LINE RIGHT  04/03/2019  . IR US GUIDE VASC ACCESS RIGHT  04/03/2019  . LAPAROTOMY N/A 03/27/2017   Procedure: EXPLORATORY LAPAROTOMY;  Surgeon: Ileana Roup, MD;  Location: [facility];  Service: General;  Laterality: N/A;  . TONSILLECTOMY    . UPPER GI ENDOSCOPY      There were no vitals filed for this visit.   Subjective Assessment - 11/21/20 1057    Subjective I've done some of the ex's while waiting at dialysis.  My main goal is to get up/down from the chair easier.    Pertinent History COPD, CHF, CAD, pacemaker, dialysis 3x/wk.  Pt is hard of hearing, peripheral neuropathy    How long can you walk comfortably? using walker/Rollator: limited to < 5 minutes    Patient Stated Goals improve gait, stability, improve endurance for walking    Currently in Pain? No/denies    Pain Score 0-No pain                             [facility] Adult PT Treatment/Exercise - 11/21/20 0001      Knee/Hip Exercises: Aerobic   Nustep L1 10 min      Knee/Hip Exercises: Standing   Gait Training side stepping the length of railing    Other Standing Knee Exercises step taps 10x    Other Standing Knee Exercises push ups on railing 10x      Knee/Hip Exercises: Seated   Long Arc Quad AROM;Right;Left;5 reps    Long Arc Quad Limitations to review initial HEP    Ball Squeeze 10x    Knee/Hip Flexion seated march to review initial HEP    Other Seated Knee/Hip Exercises review of seated heel/toe raises    Sit to [employer] 3 sets;5 reps;with UE support   max cues for foot placement, "nose past knees"; push down with heels  CUSHION IN CHAIR PLUS LIGHT UE USE                 PT Education - 11/21/20 1217    Education Details sit to stand from cushion/chair; side step at counter;  counter push ups    Person(s) Educated Patient    Methods Explanation;Demonstration;Handout    Comprehension Returned demonstration;Verbalized understanding            PT Short Term Goals - 11/19/20  1240      PT SHORT TERM GOAL #1   Title be independent in initial HEP    Time 4    Period Weeks    Status New    Target Date 12/17/20  PT SHORT TERM GOAL #2   Title perform TUG in < or = to 27 seconds to improve mobility and improve balance    Time 4    Period Weeks    Status New    Target Date 12/17/20      PT SHORT TERM GOAL #3   Title improve LE strength to perform sit to stand with moderate UE support and maintain stability upon standing    Time 4    Period Weeks    Status New    Target Date 12/17/20             PT Long Term Goals - 11/19/20 1229      PT LONG TERM GOAL #1   Title be independent in advanced HEP    Time 8    Period Weeks    Status New    Target Date 01/14/21      PT LONG TERM GOAL #2   Title perform TUG in < or = to 23 seconds to improve mobility and reduce falls risk    Time 8    Period Weeks    Status New    Target Date 01/14/21      PT LONG TERM GOAL #3   Title improve LE strength to walk for 3 minutes in the clinic without need for seated rest break    Time 8    Period Weeks    Status New    Target Date 01/14/21      PT LONG TERM GOAL #4   Title demonstrate 4/5 Lt hip strength to improve safety with transitional movements    Time 8    Period Weeks    Status New    Target Date 01/14/21      PT LONG TERM GOAL #5   Title perform sit to stand with moderate UE support 2 times consecutively and maintain balance upon standing    Time 8    Period Weeks    Status New    Target Date 01/14/21                 Plan - 11/21/20 1241    Clinical Impression Statement The patient returns for first follow up visit after evaluation.  Good carryover with initial HEP of seated ex's.  He reiterated his goal of improving with sit to stand so we practice several rounds using a cushion in the seat of the chair so hips higher than knees.  He responded well to cues for leaning forward "nose past knees", rocking for momentum and "push down  heels" to rise.  Less UE use on armrests of the chair with repetition.  He is receptive and feels confident in his ability to practice this at home in addition to 2 exercises standing at the counter.  CGA provided with all exercises for safety.    Personal Factors and Comorbidities Comorbidity 3+;Time since onset of injury/illness/exacerbation;Age    Comorbidities dialysis 3x/wk, pacemaker, COPD, CHF, falls    Examination-Activity Limitations Bed Mobility;Carry;Locomotion Level;Squat;Stairs;Stand;Transfers    Examination-Participation Restrictions Community Activity;Driving;Meal Prep;Shop    Rehab Potential Good    PT Frequency 2x / week    PT Duration 8 weeks    PT Treatment/Interventions ADLs/Self Care Home Management;Gait training;Stair training;Functional mobility training;Therapeutic activities;Therapeutic exercise;Balance training;Neuromuscular re-education;Patient/family education;Manual techniques;Passive range of motion    PT Next Visit Plan work on sit to stand from elevated surface, LE strengthening,review standing at the counter ex's added today;  short distance ambulation, NuStep  PT Home Exercise Plan Access Code: [facility]           Patient will benefit from skilled therapeutic intervention in order to improve the following deficits and impairments:  Abnormal gait,Cardiopulmonary status limiting activity,Decreased activity tolerance,Decreased strength,Postural dysfunction,Decreased range of motion,Hypomobility,Difficulty walking,Decreased mobility,Decreased balance,Decreased endurance  Visit Diagnosis: Muscle weakness (generalized)  Other abnormalities of gait and mobility     Problem List Patient Active Problem List   Diagnosis Date Noted  . 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 Dependence on renal dialysis ([place]) 11/13/2019  . Coagulation defect, unspecified ([place]) 05/11/2019  . Encounter for immunization 04/25/2019  .  Thrombocytopenia ([place]) 04/13/2019  . Hypokalemia 04/12/2019  . Anaphylactic shock, unspecified, initial encounter 04/10/2019  . Anemia in chronic kidney disease 04/10/2019  . Complication of vascular dialysis catheter 04/10/2019  . Iron deficiency anemia, unspecified 04/10/2019  . Secondary hyperparathyroidism of renal origin ([place]) 04/10/2019  . Hyperkalemia, diminished renal excretion 04/01/2019  . Hyperlipidemia associated with type 2 diabetes mellitus ([place]) 04/13/2018  . S/P cholecystectomy 10/13/2017  . Diarrhea 05/11/2017  . Ruptured appendicitis 03/26/2017  . Marital stress 02/18/2017  . Trigger finger 06/10/2016  . BPH associated with nocturia 04/27/2016  . Diabetic peripheral neuropathy ([place])   . COPD (chronic obstructive pulmonary disease) ([place])   . Bell's palsy   . Gout 06/26/2015  . Chronic systolic CHF (congestive heart failure) ([place]) 04/08/2015  . Diverticulitis 04/08/2015  . DOE (dyspnea on exertion), due to significant CAD 03/28/2015  . ESRD on hemodialysis ([place]) 03/04/2015  . CKD (chronic kidney disease) stage 4, GFR 15-29 ml/min (HCC) 03/04/2015  . CAD (coronary artery disease) 09/13/2014  . GERD (gastroesophageal reflux disease) 08/07/2012  . Major depression, recurrent, full remission ([place]) 08/05/2012  . Sleep apnea 08/05/2012  . Controlled type 2 diabetes mellitus with chronic kidney disease on chronic dialysis ([place]) 08/05/2012  . Hypertension associated with diabetes ([place]) 08/05/2012  . Diabetes type 2, uncontrolled ([place]) 08/05/2012  . Ischemic cardiomyopathy 11/03/2011  . Atrioventricular block, complete ([place])   . Obesity   . Pacemaker    Ruben Im, PT 11/21/20 12:48 PM Phone: [phone] Fax: [phone] Alvera Singh 11/21/2020, 12:48 PM  [facility] [address], [place], [place], [address] Phone: [phone]   Fax:  [phone]  Name: Rmani Salvas MRN:  [id number] Date of Birth: Jun 04, 1932

## 2020-11-21 NOTE — Patient Instructions (Signed)
Access Code: [facility] URL: [email or web address] Date: 11/21/2020 Prepared by: Ruben Im  Exercises Seated Long Arc Quad - 3 x daily - 7 x weekly - 2 sets - 10 reps - 5 hold Seated March - 3 x daily - 7 x weekly - 3 sets - 10 reps Seated Heel Toe Raises - 3 x daily - 7 x weekly - 2 sets - 10 reps Seated Isometric Hip Adduction with Ball - 3 x daily - 7 x weekly - 2 sets - 10 reps Sit to Stand with Armchair - 1 x daily - 7 x weekly - 2 sets - 4 reps Side Stepping with Counter Support - 1 x daily - 7 x weekly - 1 sets - 5 reps Push-Up on Counter - 1 x daily - 7 x weekly - 1 sets - 10 reps

## 2020-11-25 DIAGNOSIS — N186 End stage renal disease: Secondary | ICD-10-CM | POA: Diagnosis not present

## 2020-11-25 DIAGNOSIS — Z992 Dependence on renal dialysis: Secondary | ICD-10-CM | POA: Diagnosis not present

## 2020-11-25 DIAGNOSIS — D689 Coagulation defect, unspecified: Secondary | ICD-10-CM | POA: Diagnosis not present

## 2020-11-25 DIAGNOSIS — N2581 Secondary hyperparathyroidism of renal origin: Secondary | ICD-10-CM | POA: Diagnosis not present

## 2020-11-25 DIAGNOSIS — E876 Hypokalemia: Secondary | ICD-10-CM | POA: Diagnosis not present

## 2020-11-27 ENCOUNTER — Telehealth: Payer: Self-pay

## 2020-11-27 NOTE — Progress Notes (Signed)
Remote ICD transmission.   

## 2020-11-27 NOTE — Telephone Encounter (Signed)
Opening error 

## 2020-11-28 ENCOUNTER — Other Ambulatory Visit: Payer: Self-pay

## 2020-11-28 ENCOUNTER — Ambulatory Visit: Payer: PPO

## 2020-11-28 DIAGNOSIS — M6281 Muscle weakness (generalized): Secondary | ICD-10-CM | POA: Diagnosis not present

## 2020-11-28 DIAGNOSIS — R2689 Other abnormalities of gait and mobility: Secondary | ICD-10-CM

## 2020-11-28 NOTE — Therapy (Signed)
[facility] [address], [place], [place], [address] Phone: [phone]   Fax:  [phone]  Physical Therapy Treatment  Patient Details  Name: Rodney Farley MRN: [id number] Date of Birth: Mar 22, 1932 Referring Provider (PT): Hardie Pulley, MD   Encounter Date: 11/28/2020   PT End of Session - 11/28/20 1052    Visit Number 3    Date for PT Re-Evaluation 01/14/21    Authorization Type [employer]    PT Start Time 1017    PT Stop Time 1100    PT Time Calculation (min) 43 min    Activity Tolerance Patient tolerated treatment well    Behavior During Therapy [facility] for tasks assessed/performed           Past Medical History:  Diagnosis Date  . AICD (automatic cardioverter/defibrillator) present   . AKI (acute kidney injury) ([place]) 03/27/2017  . Anemia   . Anginal pain ([place])   . Anxiety   . Arm pain 05/08/2015   LEFT ARM  . Arthritis   . Atrioventricular block, complete ([place])    a. 2010 s/p pacemaker.  . Bell's palsy    left side. after shingles episode  . Bilateral renal cysts 07/23/2017   Simple and hemorrhagic noted on CT ab/pelvis   . BPH associated with nocturia   . Cardiomegaly   . Chronic systolic CHF (congestive heart failure) ([place])    EF normalized by Echo 2019  . CKD (chronic kidney disease), stage IV ([place])   . COPD (chronic obstructive pulmonary disease) (HCC)    Severe  . Coronary artery disease    a. s/p MI in 1994/1995 while in [place] s/p questionable PCI. 03/2015: progression of disease, for staged PCI.  [name] Depression   . Diabetic peripheral neuropathy ([place])   . Diarrhea   . Diverticulitis   . DOE (dyspnea on exertion) 03/28/2015  . Full dentures   . Gallstones   . GERD (gastroesophageal reflux disease)   . Gout   . Hard of hearing    B/L  . Heart murmur   . History of chronic pancreatitis 07/23/2017   noted on CT abd/pelvis  . History of shingles   .  Hypercholesterolemia   . Hypertension   . Ischemic cardiomyopathy   . MI (myocardial infarction) ([place]) 1994; 1995  . Neuropathy    IN LOWER EXTREMITIES  . Nosebleed 10/06/2017   for 2 months most recent 10/06/2017  . Obesity   . Pacemaker    medtronic>>> MDT ICD 09/23/15  . Ruptured appendicitis   . Sleep apnea    "sleeps w/humidifyer when he panics and gets short of breath" (04/08/2015)  . TIA (transient ischemic attack) X 3  . Trigger middle finger of left hand   . Type II diabetes mellitus ([place])   . Wears glasses   . Wears hearing aid     Past Surgical History:  Procedure Laterality Date  . APPENDECTOMY    . AV FISTULA PLACEMENT Right 02/07/2019   Procedure: ARTERIOVENOUS (AV) FISTULA CREATION RIGHT UPPER ARM;  Surgeon: Waynetta Sandy, MD;  Location: [facility];  Service: Vascular;  Laterality: Right;  . CARDIAC CATHETERIZATION N/A 03/29/2015   Procedure: Right/Left Heart Cath and Coronary Angiography;  Surgeon: Osinachi M [name], MD;  Location: [facility];  Service: Cardiovascular;  Laterality: N/A;  . [place]   "after my MI; put me on heart RX after cath"  . CARDIAC CATHETERIZATION N/A 04/09/2015  Procedure: Coronary Stent Intervention;  Surgeon: Hoy M [name], MD;  Location: [facility];  Service: Cardiovascular;  Laterality: N/A;  . CATARACT EXTRACTION W/ INTRAOCULAR LENS  IMPLANT, BILATERAL    . CHOLECYSTECTOMY N/A 10/13/2017   Procedure: LAPAROSCOPIC CHOLECYSTECTOMY WITH LYSIS OF ADHESIONS;  Surgeon: Ileana Roup, MD;  [facility];  Service: General;  Laterality: N/A;  . COLONOSCOPY    . DENTAL SURGERY    . EP IMPLANTABLE DEVICE N/A 09/23/2015   MDT CRT-D, Dr. Caryl Comes  . HIATAL HERNIA REPAIR  1977  . ILEOCECETOMY N/A 03/27/2017   Procedure: ILEOCECECTOMY;  Surgeon: Ileana Roup, MD;  Location: [facility] [facility];  Service: General;  Laterality: N/A;  . INSERT / REPLACE / REMOVE PACEMAKER  07/2008   Complete heart block status  post DDD with good function  . IR FLUORO GUIDE CV LINE RIGHT  04/03/2019  . IR US GUIDE VASC ACCESS RIGHT  04/03/2019  . LAPAROTOMY N/A 03/27/2017   Procedure: EXPLORATORY LAPAROTOMY;  Surgeon: Ileana Roup, MD;  Location: [facility];  Service: General;  Laterality: N/A;  . TONSILLECTOMY    . UPPER GI ENDOSCOPY      There were no vitals filed for this visit.   Subjective Assessment - 11/28/20 1023    Subjective I had the most horrible pain in my legs this morning. I woke up at 3 and couldn't get back to sleep.  I am just miserable.    Currently in Pain? Yes    Pain Score 6     Pain Location Foot    Pain Orientation Left;Right    Pain Descriptors / Indicators Tingling;Burning    Pain Type Chronic pain    Pain Onset More than a month ago    Pain Frequency Intermittent    Aggravating Factors  it is just my radiculopathy    Pain Relieving Factors nothing                             [facility] Adult PT Treatment/Exercise - 11/28/20 0001      Exercises   Exercises Shoulder      Knee/Hip Exercises: Aerobic   Nustep L1 10 min- PT present to monitor for fatigue      Knee/Hip Exercises: Seated   Long Arc Quad AROM;Right;Left;5 reps    Knee/Hip Flexion seated marching 2x10    Marching Limitations rockerboard 2x2 minutes    Hamstring Curl Strengthening;Both;2 sets;10 reps    Hamstring Limitations yellow loop    Abduction/Adduction  Strengthening;2 sets;10 reps    Abd/Adduction Limitations yellow loop abduction      Shoulder Exercises: Seated   Flexion Strengthening;Both;20 reps;Weights    Flexion Weight (lbs) 1    Other Seated Exercises bicpes curls 3: 2x10                    PT Short Term Goals - 11/28/20 1026      PT SHORT TERM GOAL #1   Title be independent in initial HEP    Status On-going      PT SHORT TERM GOAL #3   Title improve LE strength to perform sit to stand with moderate UE support and maintain stability upon standing    Status  On-going             PT Long Term Goals - 11/19/20 1229      PT LONG TERM GOAL #1   Title be independent  in advanced HEP    Time 8    Period Weeks    Status New    Target Date 01/14/21      PT LONG TERM GOAL #2   Title perform TUG in < or = to 23 seconds to improve mobility and reduce falls risk    Time 8    Period Weeks    Status New    Target Date 01/14/21      PT LONG TERM GOAL #3   Title improve LE strength to walk for 3 minutes in the clinic without need for seated rest break    Time 8    Period Weeks    Status New    Target Date 01/14/21      PT LONG TERM GOAL #4   Title demonstrate 4/5 Lt hip strength to improve safety with transitional movements    Time 8    Period Weeks    Status New    Target Date 01/14/21      PT LONG TERM GOAL #5   Title perform sit to stand with moderate UE support 2 times consecutively and maintain balance upon standing    Time 8    Period Weeks    Status New    Target Date 01/14/21                 Plan - 11/28/20 1034    Clinical Impression Statement Pt arrived today extremely unsteady and had loss of balance upon standing to enter the clinic.  PT provided CGA to min assist with gait and standing today for safety. Pt reports significant LE pain in his legs overnight and had 3 hours of 10/10 neuropathic pain.   Pt tolerated low level exercise today, mostly seated for safety with short rest breaks while alternating between upper extremity.  Sit to stand was not performed today due to pt fatigue and instability. Pt continues to He responded well to cues for leaning forward "nose past knees", rocking for momentum and "push down heels" to rise with transitional movements.  Pt will continue to benefit from skilled PT to address chronic balance, strength and endurance deficits to improve safety and independence at home and in the community.    PT Frequency 2x / week    PT Duration 8 weeks    PT Treatment/Interventions ADLs/Self Care  Home Management;Gait training;Stair training;Functional mobility training;Therapeutic activities;Therapeutic exercise;Balance training;Neuromuscular re-education;Patient/family education;Manual techniques;Passive range of motion    PT Next Visit Plan work on sit to stand from elevated surface, LE strengthening,review standing at the counter ex's added today;  short distance ambulation, NuStep    PT Home Exercise Plan Access Code: [facility]    Recommended Other Services initial cert is signed    Consulted and Agree with Plan of Care Patient           Patient will benefit from skilled therapeutic intervention in order to improve the following deficits and impairments:  Abnormal gait,Cardiopulmonary status limiting activity,Decreased activity tolerance,Decreased strength,Postural dysfunction,Decreased range of motion,Hypomobility,Difficulty walking,Decreased mobility,Decreased balance,Decreased endurance  Visit Diagnosis: Muscle weakness (generalized)  Other abnormalities of gait and mobility     Problem List Patient Active Problem List   Diagnosis Date Noted  . 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 Dependence on renal dialysis ([place]) 11/13/2019  . Coagulation defect, unspecified ([place]) 05/11/2019  . Encounter for immunization 04/25/2019  . Thrombocytopenia ([place]) 04/13/2019  . Hypokalemia 04/12/2019  . Anaphylactic shock, unspecified, initial encounter 04/10/2019  .  Anemia in chronic kidney disease 04/10/2019  . Complication of vascular dialysis catheter 04/10/2019  . Iron deficiency anemia, unspecified 04/10/2019  . Secondary hyperparathyroidism of renal origin ([place]) 04/10/2019  . Hyperkalemia, diminished renal excretion 04/01/2019  . Hyperlipidemia associated with type 2 diabetes mellitus ([place]) 04/13/2018  . S/P cholecystectomy 10/13/2017  . Diarrhea 05/11/2017  . Ruptured appendicitis 03/26/2017  . Marital stress 02/18/2017  . Trigger finger  06/10/2016  . BPH associated with nocturia 04/27/2016  . Diabetic peripheral neuropathy ([place])   . COPD (chronic obstructive pulmonary disease) ([place])   . Bell's palsy   . Gout 06/26/2015  . Chronic systolic CHF (congestive heart failure) ([place]) 04/08/2015  . Diverticulitis 04/08/2015  . DOE (dyspnea on exertion), due to significant CAD 03/28/2015  . ESRD on hemodialysis ([place]) 03/04/2015  . CKD (chronic kidney disease) stage 4, GFR 15-29 ml/min (HCC) 03/04/2015  . CAD (coronary artery disease) 09/13/2014  . GERD (gastroesophageal reflux disease) 08/07/2012  . Major depression, recurrent, full remission ([place]) 08/05/2012  . Sleep apnea 08/05/2012  . Controlled type 2 diabetes mellitus with chronic kidney disease on chronic dialysis ([place]) 08/05/2012  . Hypertension associated with diabetes ([place]) 08/05/2012  . Diabetes type 2, uncontrolled ([place]) 08/05/2012  . Ischemic cardiomyopathy 11/03/2011  . Atrioventricular block, complete ([place])   . Obesity   . Pacemaker      Sigurd Sos, PT 11/28/20 10:54 AM  [facility] [address] [place], [place], [address] Phone: [phone]   Fax:  [phone]  Name: Rodney Farley MRN: [id number] Date of Birth: 01-Jul-1932

## 2020-12-01 ENCOUNTER — Other Ambulatory Visit: Payer: Self-pay | Admitting: Podiatry

## 2020-12-02 DIAGNOSIS — Z992 Dependence on renal dialysis: Secondary | ICD-10-CM | POA: Diagnosis not present

## 2020-12-02 DIAGNOSIS — D689 Coagulation defect, unspecified: Secondary | ICD-10-CM | POA: Diagnosis not present

## 2020-12-02 DIAGNOSIS — N2581 Secondary hyperparathyroidism of renal origin: Secondary | ICD-10-CM | POA: Diagnosis not present

## 2020-12-02 DIAGNOSIS — N186 End stage renal disease: Secondary | ICD-10-CM | POA: Diagnosis not present

## 2020-12-02 DIAGNOSIS — E876 Hypokalemia: Secondary | ICD-10-CM | POA: Diagnosis not present

## 2020-12-02 MED ORDER — GABAPENTIN 300 MG PO CAPS: ORAL_CAPSULE | ORAL | 0 refills | Status: DC

## 2020-12-02 NOTE — Telephone Encounter (Signed)
Please advise 

## 2020-12-03 ENCOUNTER — Ambulatory Visit: Payer: PPO

## 2020-12-03 ENCOUNTER — Other Ambulatory Visit: Payer: Self-pay

## 2020-12-03 ENCOUNTER — Ambulatory Visit: Payer: PPO | Admitting: Podiatry

## 2020-12-03 DIAGNOSIS — L97412 Non-pressure chronic ulcer of right heel and midfoot with fat layer exposed: Secondary | ICD-10-CM

## 2020-12-03 DIAGNOSIS — E11621 Type 2 diabetes mellitus with foot ulcer: Secondary | ICD-10-CM | POA: Diagnosis not present

## 2020-12-03 DIAGNOSIS — R2689 Other abnormalities of gait and mobility: Secondary | ICD-10-CM

## 2020-12-03 DIAGNOSIS — M6281 Muscle weakness (generalized): Secondary | ICD-10-CM

## 2020-12-03 NOTE — Progress Notes (Signed)
  Subjective:  Patient ID: Rodney Farley, male    DOB: 08-14-1931,  MRN: [id number]  Chief Complaint  Patient presents with  . Wound Check    Denies f/c/n/v. Desires padding   . Peripheral Neuropathy   87 y.o. male presents for wound care.   Objective:  Physical Exam: Wound Location: right 1st met Wound Measurement: 1x1 Wound Base: granular Peri-wound: Calloused Exudate: None: wound tissue dry wound without warmth, erythema, signs of acute infection Assessment:   1. Diabetic ulcer of right midfoot associated with type 2 diabetes mellitus, with fat layer exposed ([place])    Plan:  Patient was evaluated and treated and all questions answered.  Ulcer Right 1st metatarsal -Offlload ulcer with DM insert. Awaiting the ones we had fabricated for patient. -Wound debrided as below -Dressed with silvadene and DSD  Procedure: Excisional Debridement of Wound Indication: Removal of non-viable soft tissue from the wound to promote healing.  Anesthesia: none Pre-Debridement Wound Measurements: 0.5 cm x 0.5 cm x 0.2cm  Post-Debridement Wound Measurements: 1 cm x 1 cm x 0.2 cm  Type of Debridement: Sharp Excisional Tissue Removed: Non-viable soft tissue Instrumentation: 15 blade and tissue nipper Depth of Debridement: subcutaneous tissue. Technique: Sharp excisional debridement to bleeding, viable wound base.  Dressing: Dry, sterile, compression dressing. Disposition: Patient tolerated procedure well.   Return in about 4 weeks (around 12/31/2020) for Wound Care.

## 2020-12-03 NOTE — Therapy (Signed)
[facility] [address] [place], [place], [address] Phone: [phone]   Fax:  [phone]  Physical Therapy Treatment  Patient Details  Name: Rodney Farley MRN: [id number] Date of Birth: [birth year] Referring Provider (PT): Hardie Pulley, MD   Encounter Date: 12/03/2020   PT End of Session - 12/03/20 1016    Visit Number 4    Date for PT Re-Evaluation 01/14/21    Authorization Type [employer]    Progress Note Due on Visit 10    PT Start Time 0931    PT Stop Time 1015    PT Time Calculation (min) 44 min    Activity Tolerance Patient tolerated treatment well    Behavior During Therapy [facility] for tasks assessed/performed           Past Medical History:  Diagnosis Date  . AICD (automatic cardioverter/defibrillator) present   . AKI (acute kidney injury) ([place]) 03/27/2017  . Anemia   . Anginal pain ([place])   . Anxiety   . Arm pain 05/08/2015   LEFT ARM  . Arthritis   . Atrioventricular block, complete ([place])    a. 2010 s/p pacemaker.  . Bell's palsy    left side. after shingles episode  . Bilateral renal cysts 07/23/2017   Simple and hemorrhagic noted on CT ab/pelvis   . BPH associated with nocturia   . Cardiomegaly   . Chronic systolic CHF (congestive heart failure) ([place])    EF normalized by Echo 2019  . CKD (chronic kidney disease), stage IV ([place])   . COPD (chronic obstructive pulmonary disease) (HCC)    Severe  . Coronary artery disease    a. s/p MI in 1994/1995 while in [place] s/p questionable PCI. 03/2015: progression of disease, for staged PCI.  [name] Depression   . Diabetic peripheral neuropathy ([place])   . Diarrhea   . Diverticulitis   . DOE (dyspnea on exertion) 03/28/2015  . Full dentures   . Gallstones   . GERD (gastroesophageal reflux disease)   . Gout   . Hard of hearing    B/L  . Heart murmur   . History of chronic pancreatitis 07/23/2017   noted on CT abd/pelvis  . History  of shingles   . Hypercholesterolemia   . Hypertension   . Ischemic cardiomyopathy   . MI (myocardial infarction) ([place]) 1994; 1995  . Neuropathy    IN LOWER EXTREMITIES  . Nosebleed 10/06/2017   for 2 months most recent 10/06/2017  . Obesity   . Pacemaker    medtronic>>> MDT ICD 09/23/15  . Ruptured appendicitis   . Sleep apnea    "sleeps w/humidifyer when he panics and gets short of breath" (04/08/2015)  . TIA (transient ischemic attack) X 3  . Trigger middle finger of left hand   . Type II diabetes mellitus ([place])   . Wears glasses   . Wears hearing aid     Past Surgical History:  Procedure Laterality Date  . APPENDECTOMY    . AV FISTULA PLACEMENT Right 02/07/2019   Procedure: ARTERIOVENOUS (AV) FISTULA CREATION RIGHT UPPER ARM;  Surgeon: Waynetta Sandy, MD;  Location: [facility];  Service: Vascular;  Laterality: Right;  . CARDIAC CATHETERIZATION N/A 03/29/2015   Procedure: Right/Left Heart Cath and Coronary Angiography;  Surgeon: Kamali M [name], MD;  Location: [facility];  Service: Cardiovascular;  Laterality: N/A;  . [place]   "after my MI; put me on heart RX after  cath"  . CARDIAC CATHETERIZATION N/A 04/09/2015   Procedure: Coronary Stent Intervention;  Surgeon: Jathniel M [name], MD;  Location: [facility];  Service: Cardiovascular;  Laterality: N/A;  . CATARACT EXTRACTION W/ INTRAOCULAR LENS  IMPLANT, BILATERAL    . CHOLECYSTECTOMY N/A 10/13/2017   Procedure: LAPAROSCOPIC CHOLECYSTECTOMY WITH LYSIS OF ADHESIONS;  Surgeon: Ileana Roup, MD;  [facility];  Service: General;  Laterality: N/A;  . COLONOSCOPY    . DENTAL SURGERY    . EP IMPLANTABLE DEVICE N/A 09/23/2015   MDT CRT-D, Dr. Caryl Comes  . HIATAL HERNIA REPAIR  1977  . ILEOCECETOMY N/A 03/27/2017   Procedure: ILEOCECECTOMY;  Surgeon: Ileana Roup, MD;  Location: [facility];  Service: General;  Laterality: N/A;  . INSERT / REPLACE / REMOVE PACEMAKER  07/2008   Complete  heart block status post DDD with good function  . IR FLUORO GUIDE CV LINE RIGHT  04/03/2019  . IR US GUIDE VASC ACCESS RIGHT  04/03/2019  . LAPAROTOMY N/A 03/27/2017   Procedure: EXPLORATORY LAPAROTOMY;  Surgeon: Ileana Roup, MD;  Location: [facility];  Service: General;  Laterality: N/A;  . TONSILLECTOMY    . UPPER GI ENDOSCOPY      There were no vitals filed for this visit.   Subjective Assessment - 12/03/20 0936    Subjective My neuropathy is just terrible.  I'm doing better today than last time I was here.    Currently in Pain? Yes    Pain Score 6     Pain Location Foot    Pain Orientation Right;Left    Pain Type Chronic pain    Pain Onset More than a month ago    Pain Frequency Intermittent    Aggravating Factors  it is my neuropathy    Pain Relieving Factors nothing                             [facility] Adult PT Treatment/Exercise - 12/03/20 0001      Knee/Hip Exercises: Aerobic   Nustep L3 10 min- PT present to monitor for fatigue      Knee/Hip Exercises: Standing   Heel Raises 2 sets;10 reps    Hip Flexion Stengthening;Both;Knee bent;2 sets;10 reps    Hip Abduction Stengthening;Both;2 sets;10 reps      Knee/Hip Exercises: Seated   Long Arc Quad AROM;Right;Left;2 sets;10 reps    Marching Limitations rockerboard x2 minutes    Hamstring Curl Strengthening;Both;2 sets;10 reps    Hamstring Limitations red band                    PT Short Term Goals - 11/28/20 1026      PT SHORT TERM GOAL #1   Title be independent in initial HEP    Status On-going      PT SHORT TERM GOAL #3   Title improve LE strength to perform sit to stand with moderate UE support and maintain stability upon standing    Status On-going             PT Long Term Goals - 11/19/20 1229      PT LONG TERM GOAL #1   Title be independent in advanced HEP    Time 8    Period Weeks    Status New    Target Date 01/14/21      PT LONG TERM GOAL #2   Title perform TUG  in < or =  to 23 seconds to improve mobility and reduce falls risk    Time 8    Period Weeks    Status New    Target Date 01/14/21      PT LONG TERM GOAL #3   Title improve LE strength to walk for 3 minutes in the clinic without need for seated rest break    Time 8    Period Weeks    Status New    Target Date 01/14/21      PT LONG TERM GOAL #4   Title demonstrate 4/5 Lt hip strength to improve safety with transitional movements    Time 8    Period Weeks    Status New    Target Date 01/14/21      PT LONG TERM GOAL #5   Title perform sit to stand with moderate UE support 2 times consecutively and maintain balance upon standing    Time 8    Period Weeks    Status New    Target Date 01/14/21                 Plan - 12/03/20 1010    Clinical Impression Statement Pt arrived with improved balance and reduced pain overall today.  Quality of movement and balance was significant improved with exercise and pt was able to perform more standing activity today.  PT alternated between standing and seated exercises to allow for rest.  PT provided verbal and tactile cues for safety and technique with exercise today.  Pt demonstrated Rt LE instability with standing activity today. Pt increased to level 3 on the NuStep.   Pt will continue to benefit from skilled PT to address balance, strength, and endurance to improve safety at home and in the community.    PT Frequency 2x / week    PT Duration 8 weeks    PT Treatment/Interventions ADLs/Self Care Home Management;Gait training;Stair training;Functional mobility training;Therapeutic activities;Therapeutic exercise;Balance training;Neuromuscular re-education;Patient/family education;Manual techniques;Passive range of motion    PT Next Visit Plan work on sit to stand from elevated surface, LE strengthening,review standing at the counter ex's added today;  short distance ambulation, NuStep    PT Home Exercise Plan Access Code: [place]  and Agree with Plan of Care Patient           Patient will benefit from skilled therapeutic intervention in order to improve the following deficits and impairments:  Abnormal gait,Cardiopulmonary status limiting activity,Decreased activity tolerance,Decreased strength,Postural dysfunction,Decreased range of motion,Hypomobility,Difficulty walking,Decreased mobility,Decreased balance,Decreased endurance  Visit Diagnosis: Other abnormalities of gait and mobility  Muscle weakness (generalized)     Problem List Patient Active Problem List   Diagnosis Date Noted  . 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 Dependence on renal dialysis ([place]) 11/13/2019  . Coagulation defect, unspecified ([place]) 05/11/2019  . Encounter for immunization 04/25/2019  . Thrombocytopenia ([place]) 04/13/2019  . Hypokalemia 04/12/2019  . Anaphylactic shock, unspecified, initial encounter 04/10/2019  . Anemia in chronic kidney disease 04/10/2019  . Complication of vascular dialysis catheter 04/10/2019  . Iron deficiency anemia, unspecified 04/10/2019  . Secondary hyperparathyroidism of renal origin ([place]) 04/10/2019  . Hyperkalemia, diminished renal excretion 04/01/2019  . Hyperlipidemia associated with type 2 diabetes mellitus ([place]) 04/13/2018  . S/P cholecystectomy 10/13/2017  . Diarrhea 05/11/2017  . Ruptured appendicitis 03/26/2017  . Marital stress 02/18/2017  . Trigger finger 06/10/2016  . BPH associated with nocturia 04/27/2016  . Diabetic peripheral neuropathy ([place])   .  COPD (chronic obstructive pulmonary disease) ([place])   . Bell's palsy   . Gout 06/26/2015  . Chronic systolic CHF (congestive heart failure) ([place]) 04/08/2015  . Diverticulitis 04/08/2015  . DOE (dyspnea on exertion), due to significant CAD 03/28/2015  . ESRD on hemodialysis ([place]) 03/04/2015  . CKD (chronic kidney disease) stage 4, GFR 15-29 ml/min (HCC) 03/04/2015  . CAD (coronary artery disease)  09/13/2014  . GERD (gastroesophageal reflux disease) 08/07/2012  . Major depression, recurrent, full remission ([place]) 08/05/2012  . Sleep apnea 08/05/2012  . Controlled type 2 diabetes mellitus with chronic kidney disease on chronic dialysis ([place]) 08/05/2012  . Hypertension associated with diabetes ([place]) 08/05/2012  . Diabetes type 2, uncontrolled ([place]) 08/05/2012  . Ischemic cardiomyopathy 11/03/2011  . Atrioventricular block, complete ([place])   . Obesity   . Pacemaker      Sigurd Sos, PT 12/03/20 10:17 AM  [facility] [address], [place], [place], [address] Phone: [phone]   Fax:  [phone]  Name: Dwayn Vanhooser MRN: [id number] Date of Birth: Jun 11, 1932

## 2020-12-09 DIAGNOSIS — Z992 Dependence on renal dialysis: Secondary | ICD-10-CM | POA: Diagnosis not present

## 2020-12-09 DIAGNOSIS — N186 End stage renal disease: Secondary | ICD-10-CM | POA: Diagnosis not present

## 2020-12-09 DIAGNOSIS — D689 Coagulation defect, unspecified: Secondary | ICD-10-CM | POA: Diagnosis not present

## 2020-12-09 DIAGNOSIS — N2581 Secondary hyperparathyroidism of renal origin: Secondary | ICD-10-CM | POA: Diagnosis not present

## 2020-12-10 ENCOUNTER — Ambulatory Visit: Payer: PPO | Admitting: Physical Therapy

## 2020-12-10 ENCOUNTER — Other Ambulatory Visit: Payer: Self-pay

## 2020-12-10 DIAGNOSIS — N186 End stage renal disease: Secondary | ICD-10-CM | POA: Diagnosis not present

## 2020-12-10 DIAGNOSIS — E1129 Type 2 diabetes mellitus with other diabetic kidney complication: Secondary | ICD-10-CM | POA: Diagnosis not present

## 2020-12-10 DIAGNOSIS — Z992 Dependence on renal dialysis: Secondary | ICD-10-CM | POA: Diagnosis not present

## 2020-12-11 DIAGNOSIS — Z992 Dependence on renal dialysis: Secondary | ICD-10-CM | POA: Diagnosis not present

## 2020-12-11 DIAGNOSIS — N186 End stage renal disease: Secondary | ICD-10-CM | POA: Diagnosis not present

## 2020-12-11 DIAGNOSIS — E876 Hypokalemia: Secondary | ICD-10-CM | POA: Diagnosis not present

## 2020-12-11 DIAGNOSIS — N2581 Secondary hyperparathyroidism of renal origin: Secondary | ICD-10-CM | POA: Diagnosis not present

## 2020-12-11 DIAGNOSIS — D689 Coagulation defect, unspecified: Secondary | ICD-10-CM | POA: Diagnosis not present

## 2020-12-16 DIAGNOSIS — D689 Coagulation defect, unspecified: Secondary | ICD-10-CM | POA: Diagnosis not present

## 2020-12-16 DIAGNOSIS — N186 End stage renal disease: Secondary | ICD-10-CM | POA: Diagnosis not present

## 2020-12-16 DIAGNOSIS — N2581 Secondary hyperparathyroidism of renal origin: Secondary | ICD-10-CM | POA: Diagnosis not present

## 2020-12-16 DIAGNOSIS — Z992 Dependence on renal dialysis: Secondary | ICD-10-CM | POA: Diagnosis not present

## 2020-12-17 ENCOUNTER — Ambulatory Visit: Payer: PPO | Attending: Podiatry

## 2020-12-17 ENCOUNTER — Other Ambulatory Visit: Payer: Self-pay

## 2020-12-17 DIAGNOSIS — R2689 Other abnormalities of gait and mobility: Secondary | ICD-10-CM | POA: Diagnosis not present

## 2020-12-17 DIAGNOSIS — M6281 Muscle weakness (generalized): Secondary | ICD-10-CM | POA: Diagnosis not present

## 2020-12-17 NOTE — Therapy (Signed)
[facility] [address] [name], [place], [address] Phone: [phone]   Fax:  [phone]  Physical Therapy Treatment  Patient Details  Name: Rodney Farley MRN: [id number] Date of Birth: [birth year] Referring Provider (PT): Hardie Pulley, MD   Encounter Date: 12/17/2020   PT End of Session - 12/17/20 1008    Visit Number 5    Date for PT Re-Evaluation 01/14/21    Authorization Type [employer]    Progress Note Due on Visit 10    PT Start Time 0930    PT Stop Time 1016    PT Time Calculation (min) 46 min    Activity Tolerance Patient tolerated treatment well    Behavior During Therapy [facility] for tasks assessed/performed           Past Medical History:  Diagnosis Date  . AICD (automatic cardioverter/defibrillator) present   . AKI (acute kidney injury) ([place]) 03/27/2017  . Anemia   . Anginal pain ([place])   . Anxiety   . Arm pain 05/08/2015   LEFT ARM  . Arthritis   . Atrioventricular block, complete ([place])    a. 2010 s/p pacemaker.  . Bell's palsy    left side. after shingles episode  . Bilateral renal cysts 07/23/2017   Simple and hemorrhagic noted on CT ab/pelvis   . BPH associated with nocturia   . Cardiomegaly   . Chronic systolic CHF (congestive heart failure) ([place])    EF normalized by Echo 2019  . CKD (chronic kidney disease), stage IV ([place])   . COPD (chronic obstructive pulmonary disease) (HCC)    Severe  . Coronary artery disease    a. s/p MI in 1994/1995 while in [place] s/p questionable PCI. 03/2015: progression of disease, for staged PCI.  [name] Depression   . Diabetic peripheral neuropathy ([place])   . Diarrhea   . Diverticulitis   . DOE (dyspnea on exertion) 03/28/2015  . Full dentures   . Gallstones   . GERD (gastroesophageal reflux disease)   . Gout   . Hard of hearing    B/L  . Heart murmur   . History of chronic pancreatitis 07/23/2017   noted on CT abd/pelvis  . History of  shingles   . Hypercholesterolemia   . Hypertension   . Ischemic cardiomyopathy   . MI (myocardial infarction) ([place]) 1994; 1995  . Neuropathy    IN LOWER EXTREMITIES  . Nosebleed 10/06/2017   for 2 months most recent 10/06/2017  . Obesity   . Pacemaker    medtronic>>> MDT ICD 09/23/15  . Ruptured appendicitis   . Sleep apnea    "sleeps w/humidifyer when he panics and gets short of breath" (04/08/2015)  . TIA (transient ischemic attack) X 3  . Trigger middle finger of left hand   . Type II diabetes mellitus ([place])   . Wears glasses   . Wears hearing aid     Past Surgical History:  Procedure Laterality Date  . APPENDECTOMY    . AV FISTULA PLACEMENT Right 02/07/2019   Procedure: ARTERIOVENOUS (AV) FISTULA CREATION RIGHT UPPER ARM;  Surgeon: Waynetta Sandy, MD;  Location: [facility];  Service: Vascular;  Laterality: Right;  . CARDIAC CATHETERIZATION N/A 03/29/2015   Procedure: Right/Left Heart Cath and Coronary Angiography;  Surgeon: Juno M [name], MD;  Location: [facility];  Service: Cardiovascular;  Laterality: N/A;  . [place]   "after my MI; put me on heart RX after  cath"  . CARDIAC CATHETERIZATION N/A 04/09/2015   Procedure: Coronary Stent Intervention;  Surgeon: Si M [name], MD;  Location: [facility];  Service: Cardiovascular;  Laterality: N/A;  . CATARACT EXTRACTION W/ INTRAOCULAR LENS  IMPLANT, BILATERAL    . CHOLECYSTECTOMY N/A 10/13/2017   Procedure: LAPAROSCOPIC CHOLECYSTECTOMY WITH LYSIS OF ADHESIONS;  Surgeon: Ileana Roup, MD;  [facility];  Service: General;  Laterality: N/A;  . COLONOSCOPY    . DENTAL SURGERY    . EP IMPLANTABLE DEVICE N/A 09/23/2015   MDT CRT-D, Dr. Caryl Comes  . HIATAL HERNIA REPAIR  1977  . ILEOCECETOMY N/A 03/27/2017   Procedure: ILEOCECECTOMY;  Surgeon: Ileana Roup, MD;  Location: [facility];  Service: General;  Laterality: N/A;  . INSERT / REPLACE / REMOVE PACEMAKER  07/2008   Complete heart  block status post DDD with good function  . IR FLUORO GUIDE CV LINE RIGHT  04/03/2019  . IR US GUIDE VASC ACCESS RIGHT  04/03/2019  . LAPAROTOMY N/A 03/27/2017   Procedure: EXPLORATORY LAPAROTOMY;  Surgeon: Ileana Roup, MD;  Location: [facility];  Service: General;  Laterality: N/A;  . TONSILLECTOMY    . UPPER GI ENDOSCOPY      There were no vitals filed for this visit.   Subjective Assessment - 12/17/20 0923    Subjective I was sick last week.  I was able to do some exercise.  There is a chair at my son's house and I can get out of it now.  I didn't used to get out on my own.    Pertinent History COPD, CHF, CAD, pacemaker, dialysis 3x/wk.  Pt is hard of hearing, peripheral neuropathy    Patient Stated Goals improve gait, stability, improve endurance for walking    Currently in Pain? No/denies   just my neuropathy                            [facility] Adult PT Treatment/Exercise - 12/17/20 0001      Knee/Hip Exercises: Aerobic   Nustep L3 10 min- PT present to monitor for fatigue      Knee/Hip Exercises: Standing   Heel Raises 2 sets;10 reps    Hip Abduction Stengthening;Both;2 sets;10 reps    Abduction Limitations 2# added      Knee/Hip Exercises: Seated   Long Arc Quad AROM;Right;Left;2 sets;10 reps    Long Arc Quad Weight 2 lbs.    Knee/Hip Flexion seated marching 2x10    Other Seated Knee/Hip Exercises 2# added    Hamstring Curl --    Hamstring Limitations --    Sit to Sand 3 sets;5 reps;without UE support                    PT Short Term Goals - 11/28/20 1026      PT SHORT TERM GOAL #1   Title be independent in initial HEP    Status On-going      PT SHORT TERM GOAL #3   Title improve LE strength to perform sit to stand with moderate UE support and maintain stability upon standing    Status On-going             PT Long Term Goals - 11/19/20 1229      PT LONG TERM GOAL #1   Title be independent in advanced HEP    Time 8    Period  Weeks    Status  New    Target Date 01/14/21      PT LONG TERM GOAL #2   Title perform TUG in < or = to 23 seconds to improve mobility and reduce falls risk    Time 8    Period Weeks    Status New    Target Date 01/14/21      PT LONG TERM GOAL #3   Title improve LE strength to walk for 3 minutes in the clinic without need for seated rest break    Time 8    Period Weeks    Status New    Target Date 01/14/21      PT LONG TERM GOAL #4   Title demonstrate 4/5 Lt hip strength to improve safety with transitional movements    Time 8    Period Weeks    Status New    Target Date 01/14/21      PT LONG TERM GOAL #5   Title perform sit to stand with moderate UE support 2 times consecutively and maintain balance upon standing    Time 8    Period Weeks    Status New    Target Date 01/14/21                 Plan - 12/17/20 0943    Clinical Impression Statement Pt with lapse in treatment since 12/03/20 due to illness. Pt is now able to get out of a chair at his son's house without assistance that he was previously not able to do.  Pt performed sit to stand in the clinic without UE support and demonstrated good eccentric control with stand to sit transition.  PT alternated between standing and seated exercises to allow for rest. Pt tolerated addition of 2# ankle weights with seated and standing exercises today.  PT provided verbal and tactile cues for safety and technique with exercise today.  Pt required stand by assistance and CGA for safety with standing activity.  Pt will continue to benefit from skilled PT to address balance, strength, and endurance to improve safety at home and in the community.    PT Frequency 2x / week    PT Duration 8 weeks    PT Treatment/Interventions ADLs/Self Care Home Management;Gait training;Stair training;Functional mobility training;Therapeutic activities;Therapeutic exercise;Balance training;Neuromuscular re-education;Patient/family education;Manual  techniques;Passive range of motion    PT Next Visit Plan continue to work on balance, endurance and gait    PT Home Exercise Plan Access Code: [email or web address] and Agree with Plan of Care Patient           Patient will benefit from skilled therapeutic intervention in order to improve the following deficits and impairments:  Abnormal gait,Cardiopulmonary status limiting activity,Decreased activity tolerance,Decreased strength,Postural dysfunction,Decreased range of motion,Hypomobility,Difficulty walking,Decreased mobility,Decreased balance,Decreased endurance  Visit Diagnosis: Other abnormalities of gait and mobility  Muscle weakness (generalized)     Problem List Patient Active Problem List   Diagnosis Date Noted  . 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 Dependence on renal dialysis ([place]) 11/13/2019  . Coagulation defect, unspecified ([place]) 05/11/2019  . Encounter for immunization 04/25/2019  . Thrombocytopenia ([place]) 04/13/2019  . Hypokalemia 04/12/2019  . Anaphylactic shock, unspecified, initial encounter 04/10/2019  . Anemia in chronic kidney disease 04/10/2019  . Complication of vascular dialysis catheter 04/10/2019  . Iron deficiency anemia, unspecified 04/10/2019  . Secondary hyperparathyroidism of renal origin ([place]) 04/10/2019  . Hyperkalemia, diminished renal excretion 04/01/2019  . Hyperlipidemia associated with type 2  diabetes mellitus ([place]) 04/13/2018  . S/P cholecystectomy 10/13/2017  . Diarrhea 05/11/2017  . Ruptured appendicitis 03/26/2017  . Marital stress 02/18/2017  . Trigger finger 06/10/2016  . BPH associated with nocturia 04/27/2016  . Diabetic peripheral neuropathy ([place])   . COPD (chronic obstructive pulmonary disease) ([place])   . Bell's palsy   . Gout 06/26/2015  . Chronic systolic CHF (congestive heart failure) ([place]) 04/08/2015  . Diverticulitis 04/08/2015  . DOE (dyspnea on exertion), due to significant CAD  03/28/2015  . ESRD on hemodialysis ([place]) 03/04/2015  . CKD (chronic kidney disease) stage 4, GFR 15-29 ml/min (HCC) 03/04/2015  . CAD (coronary artery disease) 09/13/2014  . GERD (gastroesophageal reflux disease) 08/07/2012  . Major depression, recurrent, full remission ([place]) 08/05/2012  . Sleep apnea 08/05/2012  . Controlled type 2 diabetes mellitus with chronic kidney disease on chronic dialysis ([place]) 08/05/2012  . Hypertension associated with diabetes ([place]) 08/05/2012  . Diabetes type 2, uncontrolled ([place]) 08/05/2012  . Ischemic cardiomyopathy 11/03/2011  . Atrioventricular block, complete ([place])   . Obesity   . Pacemaker     Sigurd Sos, PT 12/17/20 10:09 AM  [facility] [address] [place], [place], [address] Phone: [phone]   Fax:  [phone]  Name: Rodney Farley MRN: [id number] Date of Birth: [birth year]

## 2020-12-19 ENCOUNTER — Ambulatory Visit: Payer: PPO

## 2020-12-19 ENCOUNTER — Other Ambulatory Visit: Payer: Self-pay

## 2020-12-19 DIAGNOSIS — R2689 Other abnormalities of gait and mobility: Secondary | ICD-10-CM

## 2020-12-19 DIAGNOSIS — M6281 Muscle weakness (generalized): Secondary | ICD-10-CM

## 2020-12-19 NOTE — Therapy (Signed)
[facility] [address] [place], [place], [address] Phone: [phone]   Fax:  [phone]  Physical Therapy Treatment  Patient Details  Name: Rodney Farley MRN: [id number] Date of Birth: [birth year] Referring Provider (PT): Hardie Pulley, MD   Encounter Date: 12/19/2020   PT End of Session - 12/19/20 1050     Visit Number 6    Date for PT Re-Evaluation 01/14/21    Authorization Type [employer]    Progress Note Due on Visit 10    PT Start Time 1016    PT Stop Time 1057    PT Time Calculation (min) 41 min    Activity Tolerance Patient tolerated treatment well    Behavior During Therapy [facility] for tasks assessed/performed             Past Medical History:  Diagnosis Date   AICD (automatic cardioverter/defibrillator) present    AKI (acute kidney injury) ([place]) 03/27/2017   Anemia    Anginal pain ([place])    Anxiety    Arm pain 05/08/2015   LEFT ARM   Arthritis    Atrioventricular block, complete ([place])    a. 2010 s/p pacemaker.   Bell's palsy    left side. after shingles episode   Bilateral renal cysts 07/23/2017   Simple and hemorrhagic noted on CT ab/pelvis    BPH associated with nocturia    Cardiomegaly    Chronic systolic CHF (congestive heart failure) ([place])    EF normalized by Echo 2019   CKD (chronic kidney disease), stage IV (HCC)    COPD (chronic obstructive pulmonary disease) ([place])    Severe   Coronary artery disease    a. s/p MI in 1994/1995 while in [place] s/p questionable PCI. 03/2015: progression of disease, for staged PCI.   Depression    Diabetic peripheral neuropathy (HCC)    Diarrhea    Diverticulitis    DOE (dyspnea on exertion) 03/28/2015   Full dentures    Gallstones    GERD (gastroesophageal reflux disease)    Gout    Hard of hearing    B/L   Heart murmur    History of chronic pancreatitis 07/23/2017   noted on CT abd/pelvis   History of shingles     Hypercholesterolemia    Hypertension    Ischemic cardiomyopathy    MI (myocardial infarction) ([place]) 1994; 1995   Neuropathy    IN LOWER EXTREMITIES   Nosebleed 10/06/2017   for 2 months most recent 10/06/2017   Obesity    Pacemaker    medtronic>>> MDT ICD 09/23/15   Ruptured appendicitis    Sleep apnea    "sleeps w/humidifyer when he panics and gets short of breath" (04/08/2015)   TIA (transient ischemic attack) X 3   Trigger middle finger of left hand    Type II diabetes mellitus ([place])    Wears glasses    Wears hearing aid     Past Surgical History:  Procedure Laterality Date   APPENDECTOMY     AV FISTULA PLACEMENT Right 02/07/2019   Procedure: ARTERIOVENOUS (AV) FISTULA CREATION RIGHT UPPER ARM;  Surgeon: Waynetta Sandy, MD;  Location: [place];  Service: Vascular;  Laterality: Right;   CARDIAC CATHETERIZATION N/A 03/29/2015   Procedure: Right/Left Heart Cath and Coronary Angiography;  Surgeon: Rucker M [name], MD;  Location: [place] [facility];  Service: Cardiovascular;  Laterality: N/A;   [place]   "after my MI; put me on  heart RX after cath"   CARDIAC CATHETERIZATION N/A 04/09/2015   Procedure: Coronary Stent Intervention;  Surgeon: Quill M [name], MD;  Location: [place] [facility];  Service: Cardiovascular;  Laterality: N/A;   CATARACT EXTRACTION W/ INTRAOCULAR LENS  IMPLANT, BILATERAL     CHOLECYSTECTOMY N/A 10/13/2017   Procedure: LAPAROSCOPIC CHOLECYSTECTOMY WITH LYSIS OF ADHESIONS;  Surgeon: Ileana Roup, MD;  [facility];  Service: General;  Laterality: N/A;   COLONOSCOPY     DENTAL SURGERY     EP IMPLANTABLE DEVICE N/A 09/23/2015   MDT CRT-D, Dr. Caryl Comes   HIATAL HERNIA REPAIR  1977   ILEOCECETOMY N/A 03/27/2017   Procedure: ILEOCECECTOMY;  Surgeon: Ileana Roup, MD;  Location: [place];  Service: General;  Laterality: N/A;   INSERT / REPLACE / REMOVE PACEMAKER  07/2008   Complete heart block status post DDD with good function    IR FLUORO GUIDE CV LINE RIGHT  04/03/2019   IR US GUIDE [place] RIGHT  04/03/2019   LAPAROTOMY N/A 03/27/2017   Procedure: EXPLORATORY LAPAROTOMY;  Surgeon: Ileana Roup, MD;  Location: [place];  Service: General;  Laterality: N/A;   TONSILLECTOMY     UPPER GI ENDOSCOPY      There were no vitals filed for this visit.   Subjective Assessment - 12/19/20 1024     Subjective I slept all night on Tuesday night.  I was able to put my shoes and socks on without using my adaptive device that morning.    Currently in Pain? No/denies                               [facility] Adult PT Treatment/Exercise - 12/19/20 0001       Knee/Hip Exercises: Aerobic   Nustep L3 10 min- PT present to monitor for fatigue      Knee/Hip Exercises: Standing   Heel Raises 2 sets;10 reps    Hip Abduction Stengthening;Both;2 sets;10 reps    Abduction Limitations 2# added      Knee/Hip Exercises: Seated   Long Arc Quad AROM;Right;Left;2 sets;10 reps    Long Arc Quad Weight 2 lbs.    Ball Squeeze 20x    Knee/Hip Flexion seated marching 2x10    Other Seated Knee/Hip Exercises 2# added    Hamstring Curl Strengthening;Both;2 sets;10 reps    Hamstring Limitations red band    Sit to Sand 3 sets;5 reps;without UE support                 Balance Exercises - 12/19/20 0001       Balance Exercises: Standing   Sidestepping 5 reps;Upper extremity support   along treadmill stepping on colored discs   Step Over Hurdles / Cones forward and reverse and side stepping with UE support of treadmill x 10 reps each                 PT Short Term Goals - 11/28/20 1026       PT SHORT TERM GOAL #1   Title be independent in initial HEP    Status On-going      PT SHORT TERM GOAL #3   Title improve LE strength to perform sit to stand with moderate UE support and maintain stability upon standing    Status On-going               PT Long Term Goals - 11/19/20 1229  PT  LONG TERM GOAL #1   Title be independent in advanced HEP    Time 8    Period Weeks    Status New    Target Date 01/14/21      PT LONG TERM GOAL #2   Title perform TUG in < or = to 23 seconds to improve mobility and reduce falls risk    Time 8    Period Weeks    Status New    Target Date 01/14/21      PT LONG TERM GOAL #3   Title improve LE strength to walk for 3 minutes in the clinic without need for seated rest break    Time 8    Period Weeks    Status New    Target Date 01/14/21      PT LONG TERM GOAL #4   Title demonstrate 4/5 Lt hip strength to improve safety with transitional movements    Time 8    Period Weeks    Status New    Target Date 01/14/21      PT LONG TERM GOAL #5   Title perform sit to stand with moderate UE support 2 times consecutively and maintain balance upon standing    Time 8    Period Weeks    Status New    Target Date 01/14/21                   Plan - 12/19/20 1042     Clinical Impression Statement Pt is now able to get out of a chair at his son's house without assistance that he was previously not able to do and was able to put on shoes and socks without adaptive equipment this week. Pt performed sit to stand in the clinic without UE support and demonstrated good eccentric control with stand to sit transition.  Pt did well with dynamic standing exercises with 1 UE support and min guard by PT for safety.  PT provided verbal and tactile cues for safety and technique with exercise today.  Pt demonstrated improved endurance today with fewer rest breaks taken and improved control with exercises today.  Pt will continue to benefit from skilled PT to address balance, strength, and endurance to improve safety at home and in the community.    PT Frequency 2x / week    PT Duration 8 weeks    PT Treatment/Interventions ADLs/Self Care Home Management;Gait training;Stair training;Functional mobility training;Therapeutic activities;Therapeutic  exercise;Balance training;Neuromuscular re-education;Patient/family education;Manual techniques;Passive range of motion    PT Next Visit Plan continue to work on balance, endurance and gait    PT Home Exercise Plan Access Code: [facility]    Consulted and Agree with Plan of Care Patient             Patient will benefit from skilled therapeutic intervention in order to improve the following deficits and impairments:  Abnormal gait, Cardiopulmonary status limiting activity, Decreased activity tolerance, Decreased strength, Postural dysfunction, Decreased range of motion, Hypomobility, Difficulty walking, Decreased mobility, Decreased balance, Decreased endurance  Visit Diagnosis: Muscle weakness (generalized)  Other abnormalities of gait and mobility     Problem List Patient Active Problem List   Diagnosis Date Noted   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 07/29/2020   Dependence on renal dialysis ([place]) 11/13/2019   Coagulation defect, unspecified ([place]) 05/11/2019   Encounter for immunization 04/25/2019   Thrombocytopenia ([place]) 04/13/2019   Hypokalemia 04/12/2019   Anaphylactic shock, unspecified, initial encounter 04/10/2019  Anemia in chronic kidney disease [id number]   Complication of vascular dialysis catheter 04/10/2019   Iron deficiency anemia, unspecified 04/10/2019   Secondary hyperparathyroidism of renal origin ([place]) 04/10/2019   Hyperkalemia, diminished renal excretion 04/01/2019   Hyperlipidemia associated with type 2 diabetes mellitus ([place]) 04/13/2018   S/P cholecystectomy 10/13/2017   Diarrhea 05/11/2017   Ruptured appendicitis 03/26/2017   Marital stress 02/18/2017   Trigger finger 06/10/2016   BPH associated with nocturia 04/27/2016   Diabetic peripheral neuropathy (HCC)    COPD (chronic obstructive pulmonary disease) ([place])    Bell's palsy    Gout [id number]   Chronic systolic CHF (congestive heart failure) ([place]) 04/08/2015    Diverticulitis 04/08/2015   DOE (dyspnea on exertion), due to significant CAD 03/28/2015   ESRD on hemodialysis ([place]) 03/04/2015   CKD (chronic kidney disease) stage 4, GFR 15-29 ml/min (HCC) 03/04/2015   CAD (coronary artery disease) 09/13/2014   GERD (gastroesophageal reflux disease) 08/07/2012   Major depression, recurrent, full remission ([place]) 08/05/2012   Sleep apnea 08/05/2012   Controlled type 2 diabetes mellitus with chronic kidney disease on chronic dialysis ([place]) 08/05/2012   Hypertension associated with diabetes ([place]) 08/05/2012   Diabetes type 2, uncontrolled ([place]) 08/05/2012   Ischemic cardiomyopathy 11/03/2011   Atrioventricular block, complete [facility])    Obesity    Pacemaker      Sigurd Sos, PT 12/19/20 10:52 AM   [facility] [address] [place], [place], [address] Phone: [phone]   Fax:  [phone]  Name: Gor Thrapp MRN: [id number] Date of Birth: [birth year]

## 2020-12-22 DIAGNOSIS — R531 Weakness: Secondary | ICD-10-CM | POA: Diagnosis not present

## 2020-12-22 DIAGNOSIS — Z9181 History of falling: Secondary | ICD-10-CM | POA: Diagnosis not present

## 2020-12-23 DIAGNOSIS — N2581 Secondary hyperparathyroidism of renal origin: Secondary | ICD-10-CM | POA: Diagnosis not present

## 2020-12-23 DIAGNOSIS — E876 Hypokalemia: Secondary | ICD-10-CM | POA: Diagnosis not present

## 2020-12-23 DIAGNOSIS — D689 Coagulation defect, unspecified: Secondary | ICD-10-CM | POA: Diagnosis not present

## 2020-12-23 DIAGNOSIS — Z992 Dependence on renal dialysis: Secondary | ICD-10-CM | POA: Diagnosis not present

## 2020-12-23 DIAGNOSIS — N186 End stage renal disease: Secondary | ICD-10-CM | POA: Diagnosis not present

## 2020-12-24 ENCOUNTER — Other Ambulatory Visit: Payer: Self-pay

## 2020-12-24 ENCOUNTER — Ambulatory Visit: Payer: PPO

## 2020-12-24 DIAGNOSIS — R2689 Other abnormalities of gait and mobility: Secondary | ICD-10-CM | POA: Diagnosis not present

## 2020-12-24 DIAGNOSIS — M6281 Muscle weakness (generalized): Secondary | ICD-10-CM

## 2020-12-24 NOTE — Therapy (Signed)
[facility] [address], [place], [place], [address] Phone: [phone]   Fax:  [phone]  Physical Therapy Treatment  Patient Details  Name: Rodney Farley MRN: [id number] Date of Birth: [birth year] Referring Provider (Rodney Farley): Hardie Pulley, MD   Encounter Date: 12/24/2020   Rodney Farley End of Session - 12/24/20 1057     Visit Number 7    Date for Rodney Farley Re-Evaluation 01/14/21    Authorization Type [employer]    Progress Note Due on Visit 10    Rodney Farley Start Time 1017    Rodney Farley Stop Time 1058    Rodney Farley Time Calculation (min) 41 min    Activity Tolerance Patient tolerated treatment well    Behavior During Therapy Rodney Farley             Past Medical History:  Diagnosis Date   AICD (automatic cardioverter/defibrillator) present    AKI (acute kidney injury) ([place]) 03/27/2017   Anemia    Anginal pain ([place])    Anxiety    Arm pain 05/08/2015   LEFT ARM   Arthritis    Atrioventricular block, complete ([place])    a. 2010 s/p pacemaker.   Bell's palsy    left side. after shingles episode   Bilateral renal cysts 07/23/2017   Simple and hemorrhagic noted on CT ab/pelvis    BPH associated with nocturia    Cardiomegaly    Chronic systolic CHF (congestive heart failure) ([place])    EF normalized by Echo 2019   CKD (chronic kidney disease), stage IV (HCC)    COPD (chronic obstructive pulmonary disease) ([place])    Severe   Coronary artery disease    a. s/p MI in 1994/1995 while in [place] s/p questionable PCI. 03/2015: progression of disease, for staged PCI.   Depression    Diabetic peripheral neuropathy (HCC)    Diarrhea    Diverticulitis    DOE (dyspnea on exertion) 03/28/2015   Full dentures    Gallstones    GERD (gastroesophageal reflux disease)    Gout    Hard of hearing    B/L   Heart murmur    History of chronic pancreatitis 07/23/2017   noted on CT abd/pelvis   History of shingles     Hypercholesterolemia    Hypertension    Ischemic cardiomyopathy    MI (myocardial infarction) ([place]) 1994; 1995   Neuropathy    IN LOWER EXTREMITIES   Nosebleed 10/06/2017   for 2 months most recent 10/06/2017   Obesity    Pacemaker    medtronic>>> MDT ICD 09/23/15   Ruptured appendicitis    Sleep apnea    "sleeps w/humidifyer when he panics and gets short of breath" (04/08/2015)   TIA (transient ischemic attack) X 3   Trigger middle finger of left hand    Type II diabetes mellitus ([place])    Wears glasses    Wears hearing aid     Past Surgical History:  Procedure Laterality Date   APPENDECTOMY     AV FISTULA PLACEMENT Right 02/07/2019   Procedure: ARTERIOVENOUS (AV) FISTULA CREATION RIGHT UPPER ARM;  Surgeon: Waynetta Sandy, MD;  Location: [place];  Service: Vascular;  Laterality: Right;   CARDIAC CATHETERIZATION N/A 03/29/2015   Procedure: Right/Left Heart Cath and Coronary Angiography;  Surgeon: Khalen M [name], MD;  Location: [place] [facility];  Service: Cardiovascular;  Laterality: N/A;   [place]   "after my MI; put me on  heart RX after cath"   CARDIAC CATHETERIZATION N/A 04/09/2015   Procedure: Coronary Stent Intervention;  Surgeon: Keats M [name], MD;  Location: [facility];  Service: Cardiovascular;  Laterality: N/A;   CATARACT EXTRACTION W/ INTRAOCULAR LENS  IMPLANT, BILATERAL     CHOLECYSTECTOMY N/A 10/13/2017   Procedure: LAPAROSCOPIC CHOLECYSTECTOMY WITH LYSIS OF ADHESIONS;  Surgeon: Ileana Roup, MD;  [facility];  Service: General;  Laterality: N/A;   COLONOSCOPY     DENTAL SURGERY     EP IMPLANTABLE DEVICE N/A 09/23/2015   MDT CRT-D, Dr. Caryl Comes   HIATAL HERNIA REPAIR  1977   ILEOCECETOMY N/A 03/27/2017   Procedure: ILEOCECECTOMY;  Surgeon: Ileana Roup, MD;  Location: [place];  Service: General;  Laterality: N/A;   INSERT / REPLACE / REMOVE PACEMAKER  07/2008   Complete heart block status post DDD with good function    IR FLUORO GUIDE CV LINE RIGHT  04/03/2019   IR US GUIDE [place] RIGHT  04/03/2019   LAPAROTOMY N/A 03/27/2017   Procedure: EXPLORATORY LAPAROTOMY;  Surgeon: Ileana Roup, MD;  Location: [place];  Service: General;  Laterality: N/A;   TONSILLECTOMY     UPPER GI ENDOSCOPY      There were no vitals filed for this visit.   Subjective Assessment - 12/24/20 1022     Subjective I want to do level 4 on the NuStep today.  I had some back pain over the weekend but it is gone now.    Pertinent History COPD, CHF, CAD, pacemaker, dialysis 3x/wk.  Rodney Farley is hard of hearing, peripheral neuropathy    Currently in Pain? No/denies                               [facility] Adult Rodney Farley Treatment/Exercise - 12/24/20 0001       Knee/Hip Exercises: Aerobic   Nustep L4 10 min- Rodney Farley present to monitor for fatigue      Knee/Hip Exercises: Standing   Heel Raises 2 sets;10 reps    Hip Abduction Stengthening;Both;2 sets;10 reps    Abduction Limitations 2# added      Knee/Hip Exercises: Seated   Long Arc Quad AROM;Right;Left;2 sets;10 reps    Long Arc Quad Weight 2 lbs.    Knee/Hip Flexion seated marching 2x10    Other Seated Knee/Hip Exercises 2# added    Sit to Sand 3 sets;5 reps;without UE support   holding 5# kettle bell                Balance Exercises - 12/24/20 0001       Balance Exercises: Standing   Sidestepping 5 reps;Upper extremity support   along treadmill stepping on colored discs   Step Over Hurdles / Cones forward and reverse and side stepping with UE support of treadmill x 10 reps each                 Rodney Farley Short Term Goals - 11/28/20 1026       Rodney Farley SHORT TERM GOAL #1   Title be independent in initial HEP    Status On-going      Rodney Farley SHORT TERM GOAL #3   Title improve LE strength to perform sit to stand with moderate UE support and maintain stability upon standing    Status On-going               Rodney Farley Long Term Goals - 12/24/20  [place] #1   Title be independent in advanced HEP    Status On-going      Rodney Farley LONG TERM GOAL #3   Title improve LE strength to walk for 3 minutes in the clinic without need for seated rest break    Status On-going      Rodney Farley LONG TERM GOAL #5   Title perform sit to stand with moderate UE support 2 times consecutively and maintain balance upon standing    Status Achieved                   Plan - 12/24/20 1048     Clinical Impression Statement Rodney Farley is now able to consistently get up and down from the chair without UE support due to improved LE strength.  Rodney Farley independently stabilizes upon standing and has not had any episodes of instability x 2 weeks in the clinic.  Rodney Farley did well with sit to stand with 5# kettle bell today and required stand by assistance for safety with this task.  Rodney Farley with improved gait speed and independence and safety with use of walker since the start of care.  Rodney Farley progressed to level 4 on the NuStep today.  Rodney Farley will continue to benefit form skilled Rodney Farley to address strength, gait and mobility.    Rodney Farley Frequency 2x / week    Rodney Farley Duration 8 weeks    Rodney Farley Treatment/Interventions ADLs/Self Care Home Management;Gait training;Stair training;Functional mobility training;Therapeutic activities;Therapeutic exercise;Balance training;Neuromuscular re-education;Patient/family education;Manual techniques;Passive range of motion    Rodney Farley Next Visit Plan continue to work on balance, endurance and gait    Rodney Farley Home Exercise Plan Access Code: [place] and Agree with Plan of Care Patient             Patient will benefit from skilled therapeutic intervention in order to improve the following deficits and impairments:  Abnormal gait, Cardiopulmonary status limiting activity, Decreased activity tolerance, Decreased strength, Postural dysfunction, Decreased range of motion, Hypomobility, Difficulty walking, Decreased mobility, Decreased balance, Decreased endurance  Visit  Diagnosis: Muscle weakness (generalized)  Other abnormalities of gait and mobility     Problem List Patient Active Problem List   Diagnosis Date Noted   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 07/29/2020   Dependence on renal dialysis ([place]) 11/13/2019   Coagulation defect, unspecified ([place]) 05/11/2019   Encounter for immunization 04/25/2019   Thrombocytopenia ([place]) 04/13/2019   Hypokalemia 04/12/2019   Anaphylactic shock, unspecified, initial encounter 04/10/2019   Anemia in chronic kidney disease [id number]   Complication of vascular dialysis catheter 04/10/2019   Iron deficiency anemia, unspecified 04/10/2019   Secondary hyperparathyroidism of renal origin ([place]) 04/10/2019   Hyperkalemia, diminished renal excretion 04/01/2019   Hyperlipidemia associated with type 2 diabetes mellitus ([place]) 04/13/2018   S/P cholecystectomy 10/13/2017   Diarrhea 05/11/2017   Ruptured appendicitis 03/26/2017   Marital stress 02/18/2017   Trigger finger 06/10/2016   BPH associated with nocturia 04/27/2016   Diabetic peripheral neuropathy (HCC)    COPD (chronic obstructive pulmonary disease) ([place])    Bell's palsy    Gout [id number]   Chronic systolic CHF (congestive heart failure) ([place]) 04/08/2015   Diverticulitis 04/08/2015   DOE (dyspnea on exertion), due to significant CAD 03/28/2015   ESRD on hemodialysis ([place]) 03/04/2015   CKD (chronic kidney disease) stage 4, GFR 15-29 ml/min (HCC) 03/04/2015   CAD (coronary artery disease) 09/13/2014   GERD (gastroesophageal reflux  disease) 08/07/2012   Major depression, recurrent, full remission ([place]) 08/05/2012   Sleep apnea 08/05/2012   Controlled type 2 diabetes mellitus with chronic kidney disease on chronic dialysis ([place]) 08/05/2012   Hypertension associated with diabetes ([place]) 08/05/2012   Diabetes type 2, uncontrolled ([place]) 08/05/2012   Ischemic cardiomyopathy 11/03/2011   Atrioventricular block, complete [facility])    Obesity     Pacemaker     Rodney Farley, Rodney Farley 12/24/20 11:00 AM  [facility] [address] [place], [place], [address] Phone: [phone]   Fax:  [phone]  Name: Rodney Farley MRN: [id number] Date of Birth: 15-Sep-1931

## 2020-12-26 ENCOUNTER — Other Ambulatory Visit: Payer: Self-pay

## 2020-12-26 ENCOUNTER — Ambulatory Visit: Payer: PPO

## 2020-12-26 DIAGNOSIS — M6281 Muscle weakness (generalized): Secondary | ICD-10-CM

## 2020-12-26 NOTE — Therapy (Signed)
[facility] [address], [place], [place], [address] Phone: [phone]   Fax:  [phone]  Patient Details  Name: Rodney Farley MRN: [id number] Date of Birth: [birth year] Referring Provider:  Evelina Bucy, DPM  Encounter Date: 12/26/2020  Pt arrived and after walking in from the car, was not feeling well.  Pt reported blurred vision, which is something that he experiences at times.  Pt reported not feeling well overall.  PT monitored pt's vitals and he called his son to pick him up.   BP: 142/58 O2: 98% HR: 72 Symptoms resolved with rest and no treatment provided today.    Sigurd Sos, PT 12/26/20 10:39 AM   [facility] [address], [place], [place], [address] Phone: [phone]   Fax:  [phone]

## 2020-12-28 ENCOUNTER — Other Ambulatory Visit: Payer: Self-pay | Admitting: Family Medicine

## 2020-12-30 DIAGNOSIS — E876 Hypokalemia: Secondary | ICD-10-CM | POA: Diagnosis not present

## 2020-12-30 DIAGNOSIS — Z992 Dependence on renal dialysis: Secondary | ICD-10-CM | POA: Diagnosis not present

## 2020-12-30 DIAGNOSIS — N2581 Secondary hyperparathyroidism of renal origin: Secondary | ICD-10-CM | POA: Diagnosis not present

## 2020-12-30 DIAGNOSIS — D689 Coagulation defect, unspecified: Secondary | ICD-10-CM | POA: Diagnosis not present

## 2020-12-30 DIAGNOSIS — N186 End stage renal disease: Secondary | ICD-10-CM | POA: Diagnosis not present

## 2020-12-30 MED ORDER — LANSOPRAZOLE 15 MG PO CPDR: DELAYED_RELEASE_CAPSULE | ORAL | 0 refills | Status: DC

## 2020-12-31 ENCOUNTER — Encounter: Payer: Self-pay | Admitting: Internal Medicine

## 2020-12-31 ENCOUNTER — Ambulatory Visit: Payer: PPO | Admitting: Internal Medicine

## 2020-12-31 ENCOUNTER — Other Ambulatory Visit: Payer: Self-pay

## 2020-12-31 VITALS — BP 132/56 | HR 83 | Ht 67.0 in | Wt 215.6 lb

## 2020-12-31 DIAGNOSIS — I442 Atrioventricular block, complete: Secondary | ICD-10-CM | POA: Diagnosis not present

## 2020-12-31 DIAGNOSIS — I255 Ischemic cardiomyopathy: Secondary | ICD-10-CM | POA: Diagnosis not present

## 2020-12-31 DIAGNOSIS — Z95810 Presence of automatic (implantable) cardiac defibrillator: Secondary | ICD-10-CM

## 2020-12-31 DIAGNOSIS — I5022 Chronic systolic (congestive) heart failure: Secondary | ICD-10-CM | POA: Diagnosis not present

## 2020-12-31 NOTE — Progress Notes (Signed)
Patient ID: Rodney Farley, male   DOB: Sep 10, 1931, 87 y.o.   MRN: [id number]        Patient Care Team: [name], MD as PCP - General (Family Medicine) [name], Octavius M, MD as PCP - Cardiology (Cardiology) Evelina Bucy, DPM as Consulting Physician (Podiatry) Katy Fitch, Darlina Guys, MD as Consulting Physician (Ophthalmology) Jamal Maes, MD as Consulting Physician (Nephrology) Deboraha Sprang, MD as Consulting Physician (Cardiology) Center, [facility]   HPI  Rodney Farley is a 87 y.o. male Seen in follow-up for a pacemaker implanted in 2010 elsewhere. We saw him to establish in 2013. He has a history of complete heart block. He underwent CRT-D upgrade 3/17.   He has history of ischemic heart disease with prior MI.   I have not PVCs although the final programming is here aerobic programming 0     Today, the patient denies chest pain, nocturnal dyspnea, orthopnbea .  There have been no palpitations, lightheadedness or syncope.Complains of shortness of breath.  He have been experiencing exertional shortness of breath when walking and no associ chest discomfort or tightness   Two months ago he states he had several falls with using his cane  he notes he can walk better with his new walker while having  proper walking posture /   He has questions about his risk of having a stroke due to his Atrial Fibrillation detected ton his device  Hx of Bell's Palsy    DATE TEST EF   3/19 Echo  35-40%   12/16 Echo  25-30%   7/17 Echo   65 %   4/19 Echo   60-65 %   4/19 Myoview 45-50% Scar   Date   Cr              K      Hgb    11/20 5 5.0       9.9   4/21   4.5        11.6   1/22        6.3     Past Medical History:  Diagnosis Date   AICD (automatic cardioverter/defibrillator) present    AKI (acute kidney injury) ([place]) 03/27/2017   Anemia    Anginal pain ([place])    Anxiety    Arm pain 05/08/2015   LEFT ARM   Arthritis     Atrioventricular block, complete ([place])    a. 2010 s/p pacemaker.   Bell's palsy    left side. after shingles episode   Bilateral renal cysts 07/23/2017   Simple and hemorrhagic noted on CT ab/pelvis    BPH associated with nocturia    Cardiomegaly    Chronic systolic CHF (congestive heart failure) ([place])    EF normalized by Echo 2019   CKD (chronic kidney disease), stage IV (HCC)    COPD (chronic obstructive pulmonary disease) ([place])    Severe   Coronary artery disease    a. s/p MI in 1994/1995 while in [place] s/p questionable PCI. 03/2015: progression of disease, for staged PCI.   Depression    Diabetic peripheral neuropathy (HCC)    Diarrhea    Diverticulitis    DOE (dyspnea on exertion) 03/28/2015   Full dentures    Gallstones    GERD (gastroesophageal reflux disease)    Gout    Hard of hearing    B/L   Heart murmur    History of chronic pancreatitis 07/23/2017   noted on CT abd/pelvis  History of shingles    Hypercholesterolemia    Hypertension    Ischemic cardiomyopathy    MI (myocardial infarction) ([place]) 1994; 1995   Neuropathy    IN LOWER EXTREMITIES   Nosebleed 10/06/2017   for 2 months most recent 10/06/2017   Obesity    Pacemaker    medtronic>>> MDT ICD 09/23/15   Ruptured appendicitis    Sleep apnea    "sleeps w/humidifyer when he panics and gets short of breath" (04/08/2015)   TIA (transient ischemic attack) X 3   Trigger middle finger of left hand    Type II diabetes mellitus ([place])    Wears glasses    Wears hearing aid    Past Surgical History:  Procedure Laterality Date   APPENDECTOMY     AV FISTULA PLACEMENT Right 02/07/2019   Procedure: ARTERIOVENOUS (AV) FISTULA CREATION RIGHT UPPER ARM;  Surgeon: Waynetta Sandy, MD;  Location: [place];  Service: Vascular;  Laterality: Right;   CARDIAC CATHETERIZATION N/A 03/29/2015   Procedure: Right/Left Heart Cath and Coronary Angiography;  Surgeon: Tatsuya M [name], MD;  Location: [place] [facility];   Service: Cardiovascular;  Laterality: N/A;   [place]   "after my MI; put me on heart RX after cath"   CARDIAC CATHETERIZATION N/A 04/09/2015   Procedure: Coronary Stent Intervention;  Surgeon: Danthony M [name], MD;  Location: [place] [facility];  Service: Cardiovascular;  Laterality: N/A;   CATARACT EXTRACTION W/ INTRAOCULAR LENS  IMPLANT, BILATERAL     CHOLECYSTECTOMY N/A 10/13/2017   Procedure: LAPAROSCOPIC CHOLECYSTECTOMY WITH LYSIS OF ADHESIONS;  Surgeon: Ileana Roup, MD;  [facility];  Service: General;  Laterality: N/A;   COLONOSCOPY     DENTAL SURGERY     EP IMPLANTABLE DEVICE N/A 09/23/2015   MDT CRT-D, Dr. Caryl Comes   HIATAL HERNIA REPAIR  1977   ILEOCECETOMY N/A 03/27/2017   Procedure: ILEOCECECTOMY;  Surgeon: Ileana Roup, MD;  Location: [place];  Service: General;  Laterality: N/A;   INSERT / REPLACE / REMOVE PACEMAKER  07/2008   Complete heart block status post DDD with good function   IR FLUORO GUIDE CV LINE RIGHT  04/03/2019   IR US GUIDE VASC ACCESS RIGHT  04/03/2019   LAPAROTOMY N/A 03/27/2017   Procedure: EXPLORATORY LAPAROTOMY;  Surgeon: Ileana Roup, MD;  Location: [place];  Service: General;  Laterality: N/A;   TONSILLECTOMY     UPPER GI ENDOSCOPY      Current Outpatient Medications  Medication Sig Dispense Refill   aspirin EC 81 MG tablet Take 81 mg by mouth daily.     atorvastatin (LIPITOR) 40 MG tablet Take 1 tablet by mouth once daily 90 tablet 3   Carboxymethylcellul-Glycerin (LUBRICATING EYE DROPS OP) Place 1 drop into both eyes 3 (three) times daily as needed (dry eyes).     carvedilol (COREG) 6.25 MG tablet Take 1 tablet (6.25 mg total) by mouth 2 (two) times daily with a meal. Hold on mornings you have dialysis and the night before 180 tablet 3   ciclopirox (PENLAC) 8 % solution      erythromycin ophthalmic ointment Place 1 application into the left eye at bedtime.     gabapentin (NEURONTIN) 300 MG capsule TAKE 1 CAPSULE  BY MOUTH AT BEDTIME FOR 7 DAYS, THEN TAKE 2 CAPSULES DAILY - MORNING AND BEDTIME THEREAFTER 90 capsule 0   glucose blood (FREESTYLE TEST STRIPS) test strip Use to check blood sugar daily 100 each  3   glucose monitoring kit (FREESTYLE) monitoring kit USE TO MONITOR BLOOD GLUCOSE AS DIRECTED 1 each 1   lansoprazole (PREVACID) 15 MG capsule Take 1 tablet (71m) daily 90 capsule 0   midodrine (PROAMATINE) 10 MG tablet Take by mouth.     mirtazapine (REMERON) 7.5 MG tablet Take 0.5-1 tablets (3.75-7.5 mg total) by mouth at bedtime. 30 tablet 5   nitroGLYCERIN (NITROSTAT) 0.4 MG SL tablet Place 1 tablet (0.4 mg total) under the tongue every 5 (five) minutes as needed for chest pain. 2 doses before calling for emergent help 25 tablet 3   oxymetazoline (AFRIN) 0.05 % nasal spray Place 1 spray into both nostrils 2 (two) times daily as needed for congestion.     saccharomyces boulardii (FLORASTOR) 250 MG capsule Take 1 capsule (250 mg total) by mouth 2 (two) times daily. 30 capsule 0   sertraline (ZOLOFT) 100 MG tablet Take 1.5 tablets (150 mg total) by mouth daily. 135 tablet 3   sevelamer carbonate (RENVELA) 800 MG tablet Take 1,600 mg by mouth 3 (three) times daily.     sevelamer carbonate (RENVELA) 800 MG tablet Take 1,600 mg by mouth in the morning and at bedtime.      silver sulfADIAZINE (SILVADENE) 1 % cream Apply pea-sized amount to wound daily. 50 g 0   SPS 15 GM/60ML suspension      triamcinolone cream (KENALOG) 0.1 %      vitamin B-12 (CYANOCOBALAMIN) 1000 MCG tablet Take 1,000 mcg by mouth daily.     No current facility-administered medications for this visit.   Allergies  Allergen Reactions   Bee Venom Anaphylaxis   Lyrica [Pregabalin] Other (See Comments)    hallucinations   Prednisone Other (See Comments)    hallucinations   Zocor [Simvastatin] Nausea Only and Other (See Comments)    Headache with brand name only.  Can take the generic.   Review of Systems negative except from HPI  and PMH  Physical Exam: BP (!) 132/56   Pulse 83   Ht '5\' 7"'  (1.702 m)   Wt 215 lb 9.6 oz (97.8 kg)   SpO2 94%   BMI 33.77 kg/m  Well developed and well nourished in no acute distress HENT normal with L ptosis Neck supple with JVP-10 Lungs Clear Device pocket well healed; without hematoma or erythema.  There is no tethering  Regular rate and rhythm, no  gallop No murmur Abd-soft with active BS No Clubbing cyanosis tr  edema Skin-warm and dry A & Oriented  Grossly normal sensory and motor function  ECG AV pacing with upright QRS lead V1 and negative QRS lead I  Assessment and  Plan Complete heart block  Ischemic heart disease with prior MI  ICD-CRT      CHF chronic diastolic  Renal insufficiency ESRD  SCAF   End of life discussion   Without symptoms of ischemia.  Continue him on aspirin 81 Lipitor 40 as well as carvedilol 6.25  SCAF identified on his device.  Lengthy discussion regarding the thromboembolic risks associated with SCAF related to duration as well as the implications of end-stage renal disease on both thrombotic risk as well as bleeding risks and applications as to the use of anticoagulation.  I have advised at this time that we not undertake anticoagulation.  Will be continued on aspirin as above.  Volume status is dependent on his dialysis.  He does not particularly appreciate a difference in his functional status based on his dialytic intervals  Lengthy discussion regarding end-of-life.  As his device approaches ERI we discussed the importance of maintaining CRT for quality of life and functional status.  I broached the topic as to whether it was his desire that he retain high-voltage therapies for the prevention of sudden death.  Variables to consider are age, and comorbidities and end-of-life planning.  At this point he would like [place] to discuss this with his son in a few months when he returns.     I,Stephanie Williams,acting as a Education administrator for Virl Axe, MD.,have documented all relevant documentation on the behalf of Virl Axe, MD,as directed by  Virl Axe, MD while in the presence of Virl Axe, MD.  I, Virl Axe, MD, have reviewed all documentation for this visit. The documentation on 12/31/20 for the exam, diagnosis, procedures, and orders are all accurate and complete.

## 2020-12-31 NOTE — Patient Instructions (Signed)

## 2021-01-02 ENCOUNTER — Other Ambulatory Visit: Payer: Self-pay

## 2021-01-02 ENCOUNTER — Ambulatory Visit: Payer: PPO | Admitting: Physical Therapy

## 2021-01-02 DIAGNOSIS — R2689 Other abnormalities of gait and mobility: Secondary | ICD-10-CM

## 2021-01-02 DIAGNOSIS — M6281 Muscle weakness (generalized): Secondary | ICD-10-CM

## 2021-01-02 NOTE — Therapy (Signed)
[facility] [address] [place], [place], [address] Phone: [phone]   Fax:  [phone]  Physical Therapy Treatment/Recertification   Patient Details  Name: Rodney Farley MRN: [id number] Date of Birth: October 25, 1931 Referring Provider (PT): Hardie Pulley, MD   Encounter Date: 01/02/2021   PT End of Session - 01/02/21 1311     Visit Number 8    Date for PT Re-Evaluation 02/27/21    Authorization Type [employer]    Progress Note Due on Visit 10    PT Start Time 1227    PT Stop Time [id number]    PT Time Calculation (min) 38 min    Activity Tolerance Patient tolerated treatment well             Past Medical History:  Diagnosis Date   AICD (automatic cardioverter/defibrillator) present    AKI (acute kidney injury) ([place]) 03/27/2017   Anemia    Anginal pain ([place])    Anxiety    Arm pain 05/08/2015   LEFT ARM   Arthritis    Atrioventricular block, complete ([place])    a. 2010 s/p pacemaker.   Bell's palsy    left side. after shingles episode   Bilateral renal cysts 07/23/2017   Simple and hemorrhagic noted on CT ab/pelvis    BPH associated with nocturia    Cardiomegaly    Chronic systolic CHF (congestive heart failure) ([place])    EF normalized by Echo 2019   CKD (chronic kidney disease), stage IV (HCC)    COPD (chronic obstructive pulmonary disease) ([place])    Severe   Coronary artery disease    a. s/p MI in 1994/1995 while in [place] s/p questionable PCI. 03/2015: progression of disease, for staged PCI.   Depression    Diabetic peripheral neuropathy (HCC)    Diarrhea    Diverticulitis    DOE (dyspnea on exertion) 03/28/2015   Full dentures    Gallstones    GERD (gastroesophageal reflux disease)    Gout    Hard of hearing    B/L   Heart murmur    History of chronic pancreatitis 07/23/2017   noted on CT abd/pelvis   History of shingles    Hypercholesterolemia    Hypertension    Ischemic  cardiomyopathy    MI (myocardial infarction) ([place]) 1994; 1995   Neuropathy    IN LOWER EXTREMITIES   Nosebleed 10/06/2017   for 2 months most recent 10/06/2017   Obesity    Pacemaker    medtronic>>> MDT ICD 09/23/15   Ruptured appendicitis    Sleep apnea    "sleeps w/humidifyer when he panics and gets short of breath" (04/08/2015)   TIA (transient ischemic attack) X 3   Trigger middle finger of left hand    Type II diabetes mellitus ([place])    Wears glasses    Wears hearing aid     Past Surgical History:  Procedure Laterality Date   APPENDECTOMY     AV FISTULA PLACEMENT Right 02/07/2019   Procedure: ARTERIOVENOUS (AV) FISTULA CREATION RIGHT UPPER ARM;  Surgeon: Waynetta Sandy, MD;  Location: [facility];  Service: Vascular;  Laterality: Right;   CARDIAC CATHETERIZATION N/A 03/29/2015   Procedure: Right/Left Heart Cath and Coronary Angiography;  Surgeon: Armstead M [name], MD;  Location: [facility] [facility];  Service: Cardiovascular;  Laterality: N/A;   [place]   "after my MI; put me on heart RX after cath"   CARDIAC CATHETERIZATION N/A  04/09/2015   Procedure: Coronary Stent Intervention;  Surgeon: Jawan M [name], MD;  Location: [facility];  Service: Cardiovascular;  Laterality: N/A;   CATARACT EXTRACTION W/ INTRAOCULAR LENS  IMPLANT, BILATERAL     CHOLECYSTECTOMY N/A 10/13/2017   Procedure: LAPAROSCOPIC CHOLECYSTECTOMY WITH LYSIS OF ADHESIONS;  Surgeon: Ileana Roup, MD;  [facility];  Service: General;  Laterality: N/A;   COLONOSCOPY     DENTAL SURGERY     EP IMPLANTABLE DEVICE N/A 09/23/2015   MDT CRT-D, Dr. Caryl Comes   HIATAL HERNIA REPAIR  1977   ILEOCECETOMY N/A 03/27/2017   Procedure: ILEOCECECTOMY;  Surgeon: Ileana Roup, MD;  Location: [place];  Service: General;  Laterality: N/A;   INSERT / REPLACE / REMOVE PACEMAKER  07/2008   Complete heart block status post DDD with good function   IR FLUORO GUIDE CV LINE RIGHT  04/03/2019   IR US  GUIDE [place] RIGHT  04/03/2019   LAPAROTOMY N/A 03/27/2017   Procedure: EXPLORATORY LAPAROTOMY;  Surgeon: Ileana Roup, MD;  Location: [place];  Service: General;  Laterality: N/A;   TONSILLECTOMY     UPPER GI ENDOSCOPY      There were no vitals filed for this visit.   Subjective Assessment - 01/02/21 1233     Subjective I'm doing a little better but I didn't sleep last night b/c my younger brother had a stroke.  I need to leave a little early today to call my sister-in-law.   On the Nu-Step it bothers my fistula after 6 min.  I saw the cardiac doctor and my battery life is about 7 months.    Pertinent History COPD, CHF, CAD, pacemaker, dialysis 3x/wk.  Pt is hard of hearing, peripheral neuropathy    Currently in Pain? No/denies    Pain Score 0-No pain                [facility] PT Assessment - 01/02/21 0001       Functional Tests   Functional tests Step down      Step Down   Comments difficulty stepping down off curb with RW      Strength   Overall Strength Comments Lt hip 4/5, Rt hip 4/5, Rt knee 5/5, Lt knee 4/5, ankle DF 4+/5 bil      Ambulation/Gait   Ambulation/Gait Assistance 5: Supervision    Assistive device Rolling walker    Gait Pattern Step-through pattern;Decreased stride length    Gait Comments 1 incidence of stagger/stumble during session;  pt reports he feels off balance with turning 180 degrees      6 minute walk test results    Endurance additional comments 3 min walk 340 feet with rolling walker      Standardized Balance Assessment   Five times sit to stand comments  16 no UE assist      Timed Up and Go Test   TUG Normal TUG    Normal TUG (seconds) 16.24    TUG Comments using walker                           [facility] Adult PT Treatment/Exercise - 01/02/21 0001       Knee/Hip Exercises: Aerobic   Nustep L4 10 min- PT present to monitor for fatigue                      PT Short Term Goals - 01/02/21  [place] #1   Title be independent in initial HEP    Status Achieved      PT SHORT TERM GOAL #2   Title perform TUG in < or = to 27 seconds to improve mobility and improve balance    Status Achieved      PT SHORT TERM GOAL #3   Title improve LE strength to perform sit to stand with moderate UE support and maintain stability upon standing    Status Achieved               PT Long Term Goals - 01/02/21 1355       PT LONG TERM GOAL #1   Title be independent in advanced HEP    Time 8    Period Weeks    Status On-going    Target Date 02/27/21      PT LONG TERM GOAL #2   Title perform TUG in < or = to 14 seconds to improve mobility and reduce falls risk    Time 8    Period Weeks    Status Revised      PT LONG TERM GOAL #3   Title improve LE strength to walk for 6 minutes and/or 500 feet    Time 8    Period Weeks    Status On-going      PT LONG TERM GOAL #4   Title demonstrate 4+/5 bil LE strength to improve safety with stepping down off the curb    Time 8    Period Weeks    Status Revised      PT LONG TERM GOAL #5   Title 5x sit to stand test (no UE use) in 14.25 sec    Time 8    Period Weeks    Status Revised      Additional Long Term Goals   Additional Long Term Goals Yes      PT LONG TERM GOAL #6   Title No loss of balance with 180 turns 3/3 trials    Time 8    Period Weeks    Status New                   Plan - 01/02/21 1314     Clinical Impression Statement The patient demonstrates much improved gait speed with RW with further stumbles/stagger episodes.  He is able to ambulate 3 minutes (340 feet) without stopping to rest.    His Timed up and go score has dramatically improved from 33 sec to 16.24 sec.  He is able to rise from a standard chair without UE support now and complete 5x sit to stand test in 16 sec.  He continues to have LE weakness affecting his ability to step down off a curb.  He states he feels unsteady when making  turns 180 degrees.  He would benefit from a progression of strengthening, ambulation distance progression and balance exercise to reduce risk of falls.    Personal Factors and Comorbidities Comorbidity 3+;Time since onset of injury/illness/exacerbation;Age    Comorbidities dialysis 3x/wk, pacemaker, COPD, CHF, falls    Examination-Activity Limitations Bed Mobility;Carry;Locomotion Level;Squat;Stairs;Stand;Transfers    Examination-Participation Restrictions Community Activity;Driving;Meal Prep;Shop    Stability/Clinical Decision Making Evolving/Moderate complexity    Rehab Potential Good    PT Frequency 2x / week    PT Duration 8 weeks    PT Treatment/Interventions ADLs/Self Care Home Management;Gait training;Stair training;Functional mobility training;Therapeutic activities;Therapeutic  exercise;Balance training;Neuromuscular re-education;Patient/family education;Manual techniques;Passive range of motion    PT Next Visit Plan do Dynamic Gait Index; work on turns 180 degrees; step downs for new goals    PT Home Exercise Plan Access Code: [facility]             Patient will benefit from skilled therapeutic intervention in order to improve the following deficits and impairments:  Abnormal gait, Cardiopulmonary status limiting activity, Decreased activity tolerance, Decreased strength, Postural dysfunction, Decreased range of motion, Hypomobility, Difficulty walking, Decreased mobility, Decreased balance, Decreased endurance  Visit Diagnosis: Muscle weakness (generalized) - Plan: PT plan of care cert/re-cert  Other abnormalities of gait and mobility - Plan: PT plan of care cert/re-cert     Problem List Patient Active Problem List   Diagnosis Date Noted   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 07/29/2020   Dependence on renal dialysis ([place]) 11/13/2019   Coagulation defect, unspecified ([place]) 05/11/2019   Encounter for immunization 04/25/2019   Thrombocytopenia ([place])  04/13/2019   Hypokalemia 04/12/2019   Anaphylactic shock, unspecified, initial encounter 04/10/2019   Anemia in chronic kidney disease [id number]   Complication of vascular dialysis catheter 04/10/2019   Iron deficiency anemia, unspecified 04/10/2019   Secondary hyperparathyroidism of renal origin ([place]) 04/10/2019   Hyperkalemia, diminished renal excretion 04/01/2019   Hyperlipidemia associated with type 2 diabetes mellitus ([place]) 04/13/2018   S/P cholecystectomy 10/13/2017   Diarrhea 05/11/2017   Ruptured appendicitis 03/26/2017   Marital stress 02/18/2017   Trigger finger 06/10/2016   BPH associated with nocturia 04/27/2016   Diabetic peripheral neuropathy (HCC)    COPD (chronic obstructive pulmonary disease) ([place])    Bell's palsy    Gout [id number]   Chronic systolic CHF (congestive heart failure) ([place]) 04/08/2015   Diverticulitis 04/08/2015   DOE (dyspnea on exertion), due to significant CAD 03/28/2015   ESRD on hemodialysis ([place]) 03/04/2015   CKD (chronic kidney disease) stage 4, GFR 15-29 ml/min (HCC) 03/04/2015   CAD (coronary artery disease) 09/13/2014   GERD (gastroesophageal reflux disease) 08/07/2012   Major depression, recurrent, full remission ([place]) 08/05/2012   Sleep apnea 08/05/2012   Controlled type 2 diabetes mellitus with chronic kidney disease on chronic dialysis ([place]) 08/05/2012   Hypertension associated with diabetes ([place]) 08/05/2012   Diabetes type 2, uncontrolled ([place]) 08/05/2012   Ischemic cardiomyopathy 11/03/2011   Atrioventricular block, complete [facility])    Obesity    Pacemaker    Ruben Im, PT 01/02/21 6:34 PM Phone: [phone] Fax: [phone]  Alvera Singh 01/02/2021, 6:33 PM  [facility] [address], [place], [place], [address] Phone: [phone]   Fax:  [phone]  Name: Rodney Farley MRN: [id number] Date of Birth: May 12, 1932

## 2021-01-06 DIAGNOSIS — N2581 Secondary hyperparathyroidism of renal origin: Secondary | ICD-10-CM | POA: Diagnosis not present

## 2021-01-06 DIAGNOSIS — N186 End stage renal disease: Secondary | ICD-10-CM | POA: Diagnosis not present

## 2021-01-06 DIAGNOSIS — D689 Coagulation defect, unspecified: Secondary | ICD-10-CM | POA: Diagnosis not present

## 2021-01-06 DIAGNOSIS — E876 Hypokalemia: Secondary | ICD-10-CM | POA: Diagnosis not present

## 2021-01-06 DIAGNOSIS — Z992 Dependence on renal dialysis: Secondary | ICD-10-CM | POA: Diagnosis not present

## 2021-01-07 ENCOUNTER — Other Ambulatory Visit: Payer: Self-pay | Admitting: Family Medicine

## 2021-01-07 ENCOUNTER — Other Ambulatory Visit: Payer: Self-pay

## 2021-01-07 ENCOUNTER — Ambulatory Visit: Payer: PPO | Admitting: Podiatry

## 2021-01-07 DIAGNOSIS — L97412 Non-pressure chronic ulcer of right heel and midfoot with fat layer exposed: Secondary | ICD-10-CM | POA: Diagnosis not present

## 2021-01-07 DIAGNOSIS — E11621 Type 2 diabetes mellitus with foot ulcer: Secondary | ICD-10-CM | POA: Diagnosis not present

## 2021-01-07 NOTE — Progress Notes (Signed)
  Subjective:  Patient ID: Rodney Farley, male    DOB: [birth year],  MRN: [id number]  Chief Complaint  Patient presents with   Wound Check    Denies any f/c/n/v. Some pain w/ walking. Denies any other concerns.    87 y.o. male presents for wound care.   Objective:  Physical Exam: Wound Location: right 1st met Wound Measurement: 1x1 post-debridement Wound Base: granular Peri-wound: Calloused Exudate: None: wound tissue dry wound without warmth, erythema, signs of acute infection Assessment:   1. Diabetic ulcer of right midfoot associated with type 2 diabetes mellitus, with fat layer exposed ([place])     Plan:  Patient was evaluated and treated and all questions answered.  Ulcer Right 1st metatarsal -Offload ulcer with DM shoe and insert. Awaiting new insert. -Wound debrided as below -Dressed with silvadene and DSD  Procedure: Excisional Debridement of Wound Indication: Removal of non-viable soft tissue from the wound to promote healing.  Anesthesia: none Pre-Debridement Wound Measurements: 0.5 cm x 0.8 cm x 0.2 cm  Post-Debridement Wound Measurements: 1 cm x 1 cm x 0.2 cm  Type of Debridement: Sharp Excisional Tissue Removed: Non-viable soft tissue Instrumentation: 15 blade and tissue nipper Depth of Debridement: subcutaneous tissue. Technique: Sharp excisional debridement to bleeding, viable wound base.  Dressing: Dry, sterile, compression dressing. Disposition: Patient tolerated procedure well.    No follow-ups on file.

## 2021-01-09 ENCOUNTER — Other Ambulatory Visit: Payer: Self-pay | Admitting: Family Medicine

## 2021-01-09 DIAGNOSIS — E1129 Type 2 diabetes mellitus with other diabetic kidney complication: Secondary | ICD-10-CM | POA: Diagnosis not present

## 2021-01-09 DIAGNOSIS — F32A Depression, unspecified: Secondary | ICD-10-CM

## 2021-01-09 DIAGNOSIS — Z992 Dependence on renal dialysis: Secondary | ICD-10-CM | POA: Diagnosis not present

## 2021-01-09 DIAGNOSIS — N186 End stage renal disease: Secondary | ICD-10-CM | POA: Diagnosis not present

## 2021-01-09 LAB — CUP PACEART REMOTE DEVICE CHECK
Battery Remaining Longevity: 7 mo
Battery Voltage: 2.83 V
Brady Statistic AP VP Percent: 86.35 %
Brady Statistic AP VS Percent: 0.04 %
Brady Statistic AS VP Percent: 13.58 %
Brady Statistic AS VS Percent: 0.02 %
Brady Statistic RA Percent Paced: 86.29 %
Brady Statistic RV Percent Paced: 99.84 %
Date Time Interrogation Session: 20220630012504
HighPow Impedance: 69 Ohm
Implantable Lead Implant Date: 20100112
Implantable Lead Implant Date: 20170313
Implantable Lead Implant Date: 20170313
Implantable Lead Location: 753858
Implantable Lead Location: 753859
Implantable Lead Location: 753860
Implantable Lead Model: 4598
Implantable Lead Model: 5076
Implantable Pulse Generator Implant Date: 20170313
Lead Channel Impedance Value: 361 Ohm
Lead Channel Impedance Value: 418 Ohm
Lead Channel Impedance Value: 418 Ohm
Lead Channel Impedance Value: 456 Ohm
Lead Channel Impedance Value: 456 Ohm
Lead Channel Impedance Value: 475 Ohm
Lead Channel Impedance Value: 513 Ohm
Lead Channel Impedance Value: 589 Ohm
Lead Channel Impedance Value: 760 Ohm
Lead Channel Impedance Value: 779 Ohm
Lead Channel Impedance Value: 817 Ohm
Lead Channel Impedance Value: 836 Ohm
Lead Channel Impedance Value: 836 Ohm
Lead Channel Pacing Threshold Amplitude: 0.5 V
Lead Channel Pacing Threshold Amplitude: 1.625 V
Lead Channel Pacing Threshold Amplitude: 1.625 V
Lead Channel Pacing Threshold Pulse Width: 0.4 ms
Lead Channel Pacing Threshold Pulse Width: 0.4 ms
Lead Channel Pacing Threshold Pulse Width: 0.4 ms
Lead Channel Sensing Intrinsic Amplitude: 1 mV
Lead Channel Sensing Intrinsic Amplitude: 1 mV
Lead Channel Sensing Intrinsic Amplitude: 12.375 mV
Lead Channel Sensing Intrinsic Amplitude: 12.375 mV
Lead Channel Setting Pacing Amplitude: 2.5 V
Lead Channel Setting Pacing Amplitude: 3.25 V
Lead Channel Setting Pacing Amplitude: 3.25 V
Lead Channel Setting Pacing Pulse Width: 0.4 ms
Lead Channel Setting Pacing Pulse Width: 0.4 ms
Lead Channel Setting Sensing Sensitivity: 0.3 mV

## 2021-01-10 DIAGNOSIS — Z992 Dependence on renal dialysis: Secondary | ICD-10-CM | POA: Diagnosis not present

## 2021-01-10 DIAGNOSIS — D689 Coagulation defect, unspecified: Secondary | ICD-10-CM | POA: Diagnosis not present

## 2021-01-10 DIAGNOSIS — N2581 Secondary hyperparathyroidism of renal origin: Secondary | ICD-10-CM | POA: Diagnosis not present

## 2021-01-10 DIAGNOSIS — N186 End stage renal disease: Secondary | ICD-10-CM | POA: Diagnosis not present

## 2021-01-13 DIAGNOSIS — Z992 Dependence on renal dialysis: Secondary | ICD-10-CM | POA: Diagnosis not present

## 2021-01-13 DIAGNOSIS — N186 End stage renal disease: Secondary | ICD-10-CM | POA: Diagnosis not present

## 2021-01-13 DIAGNOSIS — N2581 Secondary hyperparathyroidism of renal origin: Secondary | ICD-10-CM | POA: Diagnosis not present

## 2021-01-13 DIAGNOSIS — D689 Coagulation defect, unspecified: Secondary | ICD-10-CM | POA: Diagnosis not present

## 2021-01-14 ENCOUNTER — Ambulatory Visit: Payer: PPO | Attending: Podiatry | Admitting: Physical Therapy

## 2021-01-14 ENCOUNTER — Other Ambulatory Visit: Payer: Self-pay

## 2021-01-14 ENCOUNTER — Telehealth: Payer: Self-pay

## 2021-01-14 DIAGNOSIS — R2689 Other abnormalities of gait and mobility: Secondary | ICD-10-CM | POA: Insufficient documentation

## 2021-01-14 DIAGNOSIS — M6281 Muscle weakness (generalized): Secondary | ICD-10-CM | POA: Insufficient documentation

## 2021-01-14 NOTE — Therapy (Signed)
[facility] [address] [place], [place], [address] Phone: [phone]   Fax:  [phone]  Physical Therapy Treatment  Patient Details  Name: Rodney Farley MRN: [id number] Date of Birth: 1932/03/24 Referring Provider (PT): Hardie Pulley, MD   Encounter Date: 01/14/2021   PT End of Session - 01/14/21 1234     Visit Number 9    Date for PT Re-Evaluation 02/27/21    Authorization Type [employer]    Progress Note Due on Visit 10    PT Start Time [id number]    PT Stop Time 1315    PT Time Calculation (min) 45 min    Activity Tolerance Patient tolerated treatment well             Past Medical History:  Diagnosis Date   AICD (automatic cardioverter/defibrillator) present    AKI (acute kidney injury) ([place]) 03/27/2017   Anemia    Anginal pain ([place])    Anxiety    Arm pain 05/08/2015   LEFT ARM   Arthritis    Atrioventricular block, complete ([place])    a. 2010 s/p pacemaker.   Bell's palsy    left side. after shingles episode   Bilateral renal cysts 07/23/2017   Simple and hemorrhagic noted on CT ab/pelvis    BPH associated with nocturia    Cardiomegaly    Chronic systolic CHF (congestive heart failure) ([place])    EF normalized by Echo 2019   CKD (chronic kidney disease), stage IV (HCC)    COPD (chronic obstructive pulmonary disease) ([place])    Severe   Coronary artery disease    a. s/p MI in 1994/1995 while in [place] s/p questionable PCI. 03/2015: progression of disease, for staged PCI.   Depression    Diabetic peripheral neuropathy (HCC)    Diarrhea    Diverticulitis    DOE (dyspnea on exertion) 03/28/2015   Full dentures    Gallstones    GERD (gastroesophageal reflux disease)    Gout    Hard of hearing    B/L   Heart murmur    History of chronic pancreatitis 07/23/2017   noted on CT abd/pelvis   History of shingles    Hypercholesterolemia    Hypertension    Ischemic cardiomyopathy     MI (myocardial infarction) ([place]) 1994; 1995   Neuropathy    IN LOWER EXTREMITIES   Nosebleed 10/06/2017   for 2 months most recent 10/06/2017   Obesity    Pacemaker    medtronic>>> MDT ICD 09/23/15   Ruptured appendicitis    Sleep apnea    "sleeps w/humidifyer when he panics and gets short of breath" (04/08/2015)   TIA (transient ischemic attack) X 3   Trigger middle finger of left hand    Type II diabetes mellitus ([place])    Wears glasses    Wears hearing aid     Past Surgical History:  Procedure Laterality Date   APPENDECTOMY     AV FISTULA PLACEMENT Right 02/07/2019   Procedure: ARTERIOVENOUS (AV) FISTULA CREATION RIGHT UPPER ARM;  Surgeon: Waynetta Sandy, MD;  Location: [place] [facility];  Service: Vascular;  Laterality: Right;   CARDIAC CATHETERIZATION N/A 03/29/2015   Procedure: Right/Left Heart Cath and Coronary Angiography;  Surgeon: Elvert M [name], MD;  Location: [facility] [place] [facility];  Service: Cardiovascular;  Laterality: N/A;   [place]   "after my MI; put me on heart RX after cath"   CARDIAC CATHETERIZATION N/A 04/09/2015  Procedure: Coronary Stent Intervention;  Surgeon: Eaton M [name], MD;  Location: [place] [facility];  Service: Cardiovascular;  Laterality: N/A;   CATARACT EXTRACTION W/ INTRAOCULAR LENS  IMPLANT, BILATERAL     CHOLECYSTECTOMY N/A 10/13/2017   Procedure: LAPAROSCOPIC CHOLECYSTECTOMY WITH LYSIS OF ADHESIONS;  Surgeon: Ileana Roup, MD;  [facility];  Service: General;  Laterality: N/A;   COLONOSCOPY     DENTAL SURGERY     EP IMPLANTABLE DEVICE N/A 09/23/2015   MDT CRT-D, Dr. Caryl Comes   HIATAL HERNIA REPAIR  1977   ILEOCECETOMY N/A 03/27/2017   Procedure: ILEOCECECTOMY;  Surgeon: Ileana Roup, MD;  Location: [place];  Service: General;  Laterality: N/A;   INSERT / REPLACE / REMOVE PACEMAKER  07/2008   Complete heart block status post DDD with good function   IR FLUORO GUIDE CV LINE RIGHT  04/03/2019   IR US GUIDE [place]  RIGHT  04/03/2019   LAPAROTOMY N/A 03/27/2017   Procedure: EXPLORATORY LAPAROTOMY;  Surgeon: Ileana Roup, MD;  Location: [place];  Service: General;  Laterality: N/A;   TONSILLECTOMY     UPPER GI ENDOSCOPY      There were no vitals filed for this visit.   Subjective Assessment - 01/14/21 1233     Subjective My brother is recovering.  I was turning on Sunday and I fell onto the ottomon.  I was able to drive to church.  Wound on ball of the foot is healing.    Patient Stated Goals improve gait, stability, improve endurance for walking    Currently in Pain? No/denies   numbness from usual neuropathy   Pain Score 0-No pain                               [facility] Adult PT Treatment/Exercise - 01/14/21 0001       Neuro Re-ed    Neuro Re-ed Details  facing away and turning 180 degrees to reach circles; head turns; UE lifts      Lumbar Exercises: Standing   Shoulder Extension Both;Strengthening;15 reps    Theraband Level (Shoulder Extension) Level 3 (Green)    Other Standing Lumbar Exercises 5# modified dead lift to knee level 2 sets of 5      Knee/Hip Exercises: Standing   Hip Abduction AROM;Stengthening;Left;5 reps    Abduction Limitations stepping over a flat cone    Forward Step Up Right;Left;2 sets;5 reps    Forward Step Up Limitations first set with hesitation and tremors, 2nd set much smoother; max UE support needed both sets      Knee/Hip Exercises: Seated   Sit to Sand 3 sets;5 reps;without UE support   last set holding 5# kettle bell                     PT Short Term Goals - 01/02/21 1354       PT SHORT TERM GOAL #1   Title be independent in initial HEP    Status Achieved      PT SHORT TERM GOAL #2   Title perform TUG in < or = to 27 seconds to improve mobility and improve balance    Status Achieved      PT SHORT TERM GOAL #3   Title improve LE strength to perform sit to stand with moderate UE support and maintain stability upon  standing    Status Achieved  PT Long Term Goals - 01/02/21 1355       PT LONG TERM GOAL #1   Title be independent in advanced HEP    Time 8    Period Weeks    Status On-going    Target Date 02/27/21      PT LONG TERM GOAL #2   Title perform TUG in < or = to 14 seconds to improve mobility and reduce falls risk    Time 8    Period Weeks    Status Revised      PT LONG TERM GOAL #3   Title improve LE strength to walk for 6 minutes and/or 500 feet    Time 8    Period Weeks    Status On-going      PT LONG TERM GOAL #4   Title demonstrate 4+/5 bil LE strength to improve safety with stepping down off the curb    Time 8    Period Weeks    Status Revised      PT LONG TERM GOAL #5   Title 5x sit to stand test (no UE use) in 14.25 sec    Time 8    Period Weeks    Status Revised      Additional Long Term Goals   Additional Long Term Goals Yes      PT LONG TERM GOAL #6   Title No loss of balance with 180 turns 3/3 trials    Time 8    Period Weeks    Status New                   Plan - 01/14/21 1739     Clinical Impression Statement The patient noted continues to demonstrate excellent improvement in sit to stand strength without UE assist.  Progressed to more challenging exercises including 4 inch step ups.  The 1st set was quite difficult with tremors and dysmetria (scaling movement to meet the step).  The 2nd set was much smoother.   Treatment session also including balance ex to address loss of balance with turns (recent fall over the weekend).  He requires mod physical assist to restore balance 2x with 180 degree turns.  He also loses balance with looking up requiring physical assist from therapist.  After a short rest, he is able to ambulate to his car in the handicap spot and lift his walker into the trunk without loss of balance.    Personal Factors and Comorbidities Comorbidity 3+;Time since onset of injury/illness/exacerbation;Age     Comorbidities dialysis 3x/wk, pacemaker, COPD, CHF, falls    Examination-Activity Limitations Bed Mobility;Carry;Locomotion Level;Squat;Stairs;Stand;Transfers    Examination-Participation Restrictions Community Activity;Driving;Meal Prep;Shop    Rehab Potential Good    PT Frequency 2x / week    PT Duration 8 weeks    PT Treatment/Interventions ADLs/Self Care Home Management;Gait training;Stair training;Functional mobility training;Therapeutic activities;Therapeutic exercise;Balance training;Neuromuscular re-education;Patient/family education;Manual techniques;Passive range of motion    PT Next Visit Plan 10the visit prog note;  work on turns 180 degrees;  step ups    PT Home Exercise Plan Access Code: [facility]             Patient will benefit from skilled therapeutic intervention in order to improve the following deficits and impairments:  Abnormal gait, Cardiopulmonary status limiting activity, Decreased activity tolerance, Decreased strength, Postural dysfunction, Decreased range of motion, Hypomobility, Difficulty walking, Decreased mobility, Decreased balance, Decreased endurance  Visit Diagnosis: Muscle weakness (generalized)  Other abnormalities of gait and mobility  Problem List Patient Active Problem List   Diagnosis Date Noted   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 07/29/2020   Dependence on renal dialysis ([place]) 11/13/2019   Coagulation defect, unspecified ([place]) 05/11/2019   Encounter for immunization 04/25/2019   Thrombocytopenia ([place]) 04/13/2019   Hypokalemia 04/12/2019   Anaphylactic shock, unspecified, initial encounter 04/10/2019   Anemia in chronic kidney disease [id number]   Complication of vascular dialysis catheter 04/10/2019   Iron deficiency anemia, unspecified 04/10/2019   Secondary hyperparathyroidism of renal origin ([place]) 04/10/2019   Hyperkalemia, diminished renal excretion 04/01/2019   Hyperlipidemia associated with type 2  diabetes mellitus ([place]) 04/13/2018   S/P cholecystectomy 10/13/2017   Diarrhea 05/11/2017   Ruptured appendicitis 03/26/2017   Marital stress 02/18/2017   Trigger finger 06/10/2016   BPH associated with nocturia 04/27/2016   Diabetic peripheral neuropathy (HCC)    COPD (chronic obstructive pulmonary disease) ([place])    Bell's palsy    Gout [id number]   Chronic systolic CHF (congestive heart failure) ([place]) 04/08/2015   Diverticulitis 04/08/2015   DOE (dyspnea on exertion), due to significant CAD 03/28/2015   ESRD on hemodialysis ([place]) 03/04/2015   CKD (chronic kidney disease) stage 4, GFR 15-29 ml/min (HCC) 03/04/2015   CAD (coronary artery disease) 09/13/2014   GERD (gastroesophageal reflux disease) 08/07/2012   Major depression, recurrent, full remission ([place]) 08/05/2012   Sleep apnea 08/05/2012   Controlled type 2 diabetes mellitus with chronic kidney disease on chronic dialysis ([place]) 08/05/2012   Hypertension associated with diabetes ([place]) 08/05/2012   Diabetes type 2, uncontrolled ([place]) 08/05/2012   Ischemic cardiomyopathy 11/03/2011   Atrioventricular block, complete [facility])    Obesity    Pacemaker    Ruben Im, PT 01/14/21 5:51 PM Phone: [phone] Fax: [phone]  Alvera Singh 01/14/2021, 5:50 PM  [facility] [address] [place], [place], [address] Phone: [phone]   Fax:  [phone]  Name: Rodney Farley MRN: [id number] Date of Birth: Nov 15, 1931

## 2021-01-14 NOTE — Telephone Encounter (Signed)
Error

## 2021-01-16 ENCOUNTER — Encounter: Payer: Self-pay | Admitting: Physician Assistant

## 2021-01-16 ENCOUNTER — Other Ambulatory Visit: Payer: Self-pay

## 2021-01-16 ENCOUNTER — Ambulatory Visit: Payer: PPO | Admitting: Physical Therapy

## 2021-01-16 ENCOUNTER — Ambulatory Visit (INDEPENDENT_AMBULATORY_CARE_PROVIDER_SITE_OTHER): Payer: PPO | Admitting: Physician Assistant

## 2021-01-16 VITALS — BP 129/66 | HR 78 | Temp 97.2°F | Ht 67.0 in | Wt 215.2 lb

## 2021-01-16 DIAGNOSIS — M6281 Muscle weakness (generalized): Secondary | ICD-10-CM

## 2021-01-16 DIAGNOSIS — R2689 Other abnormalities of gait and mobility: Secondary | ICD-10-CM

## 2021-01-16 DIAGNOSIS — H6123 Impacted cerumen, bilateral: Secondary | ICD-10-CM

## 2021-01-16 MED ORDER — CARBAMIDE PEROXIDE 6.5 % OT SOLN: 5.0000 [drp] | Freq: Two times a day (BID) | OTIC | 2 refills | Status: DC

## 2021-01-16 NOTE — Patient Instructions (Signed)
Use the Debrox drops to prevent wax build-up. Do not put anything in your ears (I.e. q-tips or ear wax removal device).

## 2021-01-16 NOTE — Therapy (Signed)
[facility] [address] [place], [place], [address] Phone: [phone]   Fax:  [phone]  Physical Therapy Treatment  Patient Details  Name: Rodney Farley MRN: [id number] Date of Birth: 20-Dec-1931 Referring Provider (PT): Hardie Pulley, MD Progress Note Reporting Period 11/19/20 to 01/16/2021   See note below for Objective Data and Assessment of Progress/Goals.      Encounter Date: 01/16/2021   PT End of Session - 01/16/21 1257     Visit Number 10    Date for PT Re-Evaluation 02/27/21    Authorization Type [employer]    Progress Note Due on Visit 10    PT Start Time 1230    PT Stop Time 1315    PT Time Calculation (min) 45 min    Activity Tolerance Patient tolerated treatment well             Past Medical History:  Diagnosis Date   AICD (automatic cardioverter/defibrillator) present    AKI (acute kidney injury) ([place]) 03/27/2017   Anemia    Anginal pain (HCC)    Anxiety    Arm pain 05/08/2015   LEFT ARM   Arthritis    Atrioventricular block, complete ([place])    a. 2010 s/p pacemaker.   Bell's palsy    left side. after shingles episode   Bilateral renal cysts 07/23/2017   Simple and hemorrhagic noted on CT ab/pelvis    BPH associated with nocturia    Cardiomegaly    Chronic systolic CHF (congestive heart failure) ([place])    EF normalized by Echo 2019   CKD (chronic kidney disease), stage IV (HCC)    COPD (chronic obstructive pulmonary disease) ([place])    Severe   Coronary artery disease    a. s/p MI in 1994/1995 while in [place] s/p questionable PCI. 03/2015: progression of disease, for staged PCI.   Depression    Diabetic peripheral neuropathy (HCC)    Diarrhea    Diverticulitis    DOE (dyspnea on exertion) 03/28/2015   Full dentures    Gallstones    GERD (gastroesophageal reflux disease)    Gout    Hard of hearing    B/L   Heart murmur    History of chronic pancreatitis  07/23/2017   noted on CT abd/pelvis   History of shingles    Hypercholesterolemia    Hypertension    Ischemic cardiomyopathy    MI (myocardial infarction) ([place]) 1994; 1995   Neuropathy    IN LOWER EXTREMITIES   Nosebleed 10/06/2017   for 2 months most recent 10/06/2017   Obesity    Pacemaker    medtronic>>> MDT ICD 09/23/15   Ruptured appendicitis    Sleep apnea    "sleeps w/humidifyer when he panics and gets short of breath" (04/08/2015)   TIA (transient ischemic attack) X 3   Trigger middle finger of left hand    Type II diabetes mellitus ([place])    Wears glasses    Wears hearing aid     Past Surgical History:  Procedure Laterality Date   APPENDECTOMY     AV FISTULA PLACEMENT Right 02/07/2019   Procedure: ARTERIOVENOUS (AV) FISTULA CREATION RIGHT UPPER ARM;  Surgeon: Waynetta Sandy, MD;  Location: [facility];  Service: Vascular;  Laterality: Right;   CARDIAC CATHETERIZATION N/A 03/29/2015   Procedure: Right/Left Heart Cath and Coronary Angiography;  Surgeon: Brentley M [name], MD;  Location: [place];  Service: Cardiovascular;  Laterality: N/A;  [place]   "after my MI; put me on heart RX after cath"   CARDIAC CATHETERIZATION N/A 04/09/2015   Procedure: Coronary Stent Intervention;  Surgeon: Edwardo M [name], MD;  Location: [facility];  Service: Cardiovascular;  Laterality: N/A;   CATARACT EXTRACTION W/ INTRAOCULAR LENS  IMPLANT, BILATERAL     CHOLECYSTECTOMY N/A 10/13/2017   Procedure: LAPAROSCOPIC CHOLECYSTECTOMY WITH LYSIS OF ADHESIONS;  Surgeon: Ileana Roup, MD;  [facility];  Service: General;  Laterality: N/A;   COLONOSCOPY     DENTAL SURGERY     EP IMPLANTABLE DEVICE N/A 09/23/2015   MDT CRT-D, Dr. Caryl Comes   HIATAL HERNIA REPAIR  1977   ILEOCECETOMY N/A 03/27/2017   Procedure: ILEOCECECTOMY;  Surgeon: Ileana Roup, MD;  Location: [facility];  Service: General;  Laterality: N/A;   INSERT / REPLACE / REMOVE PACEMAKER   07/2008   Complete heart block status post DDD with good function   IR FLUORO GUIDE CV LINE RIGHT  04/03/2019   IR US GUIDE [place] RIGHT  04/03/2019   LAPAROTOMY N/A 03/27/2017   Procedure: EXPLORATORY LAPAROTOMY;  Surgeon: Ileana Roup, MD;  Location: [facility];  Service: General;  Laterality: N/A;   TONSILLECTOMY     UPPER GI ENDOSCOPY      There were no vitals filed for this visit.   Subjective Assessment - 01/16/21 1227     Subjective Very little incidence of aching muscles after therapy.  I did have a stumble walking 90 degrees between cars.    Pertinent History COPD, CHF, CAD, pacemaker, dialysis 3x/wk.  Fistula in right arm; Pt is hard of hearing, peripheral neuropathy    Patient Stated Goals improve gait, stability, improve endurance for walking    Currently in Pain? No/denies    Pain Score 0-No pain                [facility] PT Assessment - 01/16/21 0001       6 minute walk test results    Endurance additional comments 3 min walk 560 feet   loops around gym; with RW     Standardized Balance Assessment   Five times sit to stand comments  15.58 no UE assist      Timed Up and Go Test   TUG Normal TUG    Normal TUG (seconds) 11.26    TUG Comments using walker                           [facility] Adult PT Treatment/Exercise - 01/16/21 0001       Knee/Hip Exercises: Aerobic   Nustep L4 10 min- PT present to monitor for fatigue      Knee/Hip Exercises: Standing   Other Standing Knee Exercises 2 rounds: step taps 5x, 4 inch step ups 3x each leg, 5# kettlebell mini lifts to between knees                      PT Short Term Goals - 01/02/21 1354       PT SHORT TERM GOAL #1   Title be independent in initial HEP    Status Achieved      PT SHORT TERM GOAL #2   Title perform TUG in < or = to 27 seconds to improve mobility and improve balance    Status Achieved      PT SHORT TERM GOAL #3   Title  improve LE strength to perform sit to  stand with moderate UE support and maintain stability upon standing    Status Achieved               PT Long Term Goals - 01/16/21 1350       PT LONG TERM GOAL #1   Title be independent in advanced HEP    Time 8    Period Weeks    Status On-going      PT LONG TERM GOAL #2   Title perform TUG in < or = to 14 seconds to improve mobility and reduce falls risk    Status Achieved      PT LONG TERM GOAL #3   Title improve LE strength to walk for 6 minutes and/or 500 feet    Time 8    Period Weeks    Status Partially Met      PT LONG TERM GOAL #4   Title demonstrate 4+/5 bil LE strength to improve safety with stepping down off the curb    Time 8    Period Weeks    Status On-going      PT LONG TERM GOAL #5   Title 5x sit to stand test (no UE use) in 14.25 sec    Status On-going      PT LONG TERM GOAL #6   Title No loss of balance with 180 turns 3/3 trials    Time 8    Period Weeks    Status On-going                   Plan - 01/16/21 1343     Clinical Impression Statement The patient continues to steadily improve speed and strength with significant improvements in Timed up and Go test, 5x sit to stand test and 3 min walk test.  Moderately short of breath secondary to faster speed with 3 min walk but recovers quickly with seated rest.  Discussed goal to walk 6 min at a slower pace.   Less dysmetria noted with step ups today.  CGA for safety with standing ex's but no physical assistance needed today for loss of balance.  Progressing well with rehab goals.    Therapist monitoring response throughout session.    Comorbidities dialysis 3x/wk, pacemaker, COPD, CHF, falls    Examination-Activity Limitations Bed Mobility;Carry;Locomotion Level;Squat;Stairs;Stand;Transfers    Examination-Participation Restrictions Community Activity;Driving;Meal Prep;Shop    Stability/Clinical Decision Making Evolving/Moderate complexity    Rehab Potential Good    PT Frequency 2x / week     PT Duration 8 weeks    PT Treatment/Interventions ADLs/Self Care Home Management;Gait training;Stair training;Functional mobility training;Therapeutic activities;Therapeutic exercise;Balance training;Neuromuscular re-education;Patient/family education;Manual techniques;Passive range of motion    PT Next Visit Plan work on turns 180 degrees; holding 5# Kettlebell with 90 degree turns;   step ups 4 inch with gradual reduction in UE support    PT Home Exercise Plan Access Code: [facility]             Patient will benefit from skilled therapeutic intervention in order to improve the following deficits and impairments:  Abnormal gait, Cardiopulmonary status limiting activity, Decreased activity tolerance, Decreased strength, Postural dysfunction, Decreased range of motion, Hypomobility, Difficulty walking, Decreased mobility, Decreased balance, Decreased endurance  Visit Diagnosis: Muscle weakness (generalized)  Other abnormalities of gait and mobility     Problem List Patient Active Problem List   Diagnosis Date Noted   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 07/29/2020  Dependence on renal dialysis ([place]) 11/13/2019   Coagulation defect, unspecified ([place]) 05/11/2019   Encounter for immunization 04/25/2019   Thrombocytopenia ([place]) 04/13/2019   Hypokalemia 04/12/2019   Anaphylactic shock, unspecified, initial encounter 04/10/2019   Anemia in chronic kidney disease 49/32/4199   Complication of vascular dialysis catheter 04/10/2019   Iron deficiency anemia, unspecified 04/10/2019   Secondary hyperparathyroidism of renal origin ([place]) 04/10/2019   Hyperkalemia, diminished renal excretion 04/01/2019   Hyperlipidemia associated with type 2 diabetes mellitus ([place]) 04/13/2018   S/P cholecystectomy 10/13/2017   Diarrhea 05/11/2017   Ruptured appendicitis 03/26/2017   Marital stress 02/18/2017   Trigger finger 06/10/2016   BPH associated with nocturia 04/27/2016    Diabetic peripheral neuropathy (HCC)    COPD (chronic obstructive pulmonary disease) ([place])    Bell's palsy    Gout 14/44/5848   Chronic systolic CHF (congestive heart failure) ([place]) 04/08/2015   Diverticulitis 04/08/2015   DOE (dyspnea on exertion), due to significant CAD 03/28/2015   ESRD on hemodialysis ([place]) 03/04/2015   CKD (chronic kidney disease) stage 4, GFR 15-29 ml/min (HCC) 03/04/2015   CAD (coronary artery disease) 09/13/2014   GERD (gastroesophageal reflux disease) 08/07/2012   Major depression, recurrent, full remission ([place]) 08/05/2012   Sleep apnea 08/05/2012   Controlled type 2 diabetes mellitus with chronic kidney disease on chronic dialysis ([place]) 08/05/2012   Hypertension associated with diabetes ([place]) 08/05/2012   Diabetes type 2, uncontrolled ([place]) 08/05/2012   Ischemic cardiomyopathy 11/03/2011   Atrioventricular block, complete [facility])    Obesity    Pacemaker    Ruben Im, PT 01/16/21 1:54 PM Phone: [phone] Fax: [phone]  Alvera Singh 01/16/2021, 1:53 PM  [facility] [address], [place], [place], [address] Phone: [phone]   Fax:  [phone]  Name: Kevork Joyce MRN: [id number] Date of Birth: Dec 10, 1931

## 2021-01-16 NOTE — Progress Notes (Addendum)
Acute Office Visit  Subjective:    Patient ID: Rodney Farley, male    DOB: April 28, 1932, 87 y.o.   MRN: [id number]  Chief Complaint  Patient presents with   Ear Fullness   decreased Hearing    Right ear    HPI Patient is in today for decreased hearing in R ear, suspects ear wax build up. Wears hearing aids and ear plugs (during dialysis). No pain. No discharge.   Past Medical History:  Diagnosis Date   AICD (automatic cardioverter/defibrillator) present    AKI (acute kidney injury) ([place]) 03/27/2017   Anemia    Anginal pain ([place])    Anxiety    Arm pain 05/08/2015   LEFT ARM   Arthritis    Atrioventricular block, complete ([place])    a. 2010 s/p pacemaker.   Bell's palsy    left side. after shingles episode   Bilateral renal cysts 07/23/2017   Simple and hemorrhagic noted on CT ab/pelvis    BPH associated with nocturia    Cardiomegaly    Chronic systolic CHF (congestive heart failure) ([place])    EF normalized by Echo 2019   CKD (chronic kidney disease), stage IV (HCC)    COPD (chronic obstructive pulmonary disease) ([place])    Severe   Coronary artery disease    a. s/p MI in 1994/1995 while in [place] s/p questionable PCI. 03/2015: progression of disease, for staged PCI.   Depression    Diabetic peripheral neuropathy (HCC)    Diarrhea    Diverticulitis    DOE (dyspnea on exertion) 03/28/2015   Full dentures    Gallstones    GERD (gastroesophageal reflux disease)    Gout    Hard of hearing    B/L   Heart murmur    History of chronic pancreatitis 07/23/2017   noted on CT abd/pelvis   History of shingles    Hypercholesterolemia    Hypertension    Ischemic cardiomyopathy    MI (myocardial infarction) ([place]) 1994; 1995   Neuropathy    IN LOWER EXTREMITIES   Nosebleed 10/06/2017   for 2 months most recent 10/06/2017   Obesity    Pacemaker    medtronic>>> MDT ICD 09/23/15   Ruptured appendicitis    Sleep apnea    "sleeps w/humidifyer when he panics and gets  short of breath" (04/08/2015)   TIA (transient ischemic attack) X 3   Trigger middle finger of left hand    Type II diabetes mellitus ([place])    Wears glasses    Wears hearing aid     Past Surgical History:  Procedure Laterality Date   APPENDECTOMY     AV FISTULA PLACEMENT Right 02/07/2019   Procedure: ARTERIOVENOUS (AV) FISTULA CREATION RIGHT UPPER ARM;  Surgeon: Waynetta Sandy, MD;  Location: [facility];  Service: Vascular;  Laterality: Right;   CARDIAC CATHETERIZATION N/A 03/29/2015   Procedure: Right/Left Heart Cath and Coronary Angiography;  Surgeon: Tobyn M [name], MD;  Location: [facility];  Service: Cardiovascular;  Laterality: N/A;   [place]   "after my MI; put me on heart RX after cath"   CARDIAC CATHETERIZATION N/A 04/09/2015   Procedure: Coronary Stent Intervention;  Surgeon: Purvis M [name], MD;  Location: [facility];  Service: Cardiovascular;  Laterality: N/A;   CATARACT EXTRACTION W/ INTRAOCULAR LENS  IMPLANT, BILATERAL     CHOLECYSTECTOMY N/A 10/13/2017   Procedure: LAPAROSCOPIC CHOLECYSTECTOMY WITH LYSIS OF ADHESIONS;  Surgeon: Ileana Roup, MD;  [facility];  Service: General;  Laterality: N/A;   COLONOSCOPY     DENTAL SURGERY     EP IMPLANTABLE DEVICE N/A 09/23/2015   MDT CRT-D, Dr. Caryl Comes   HIATAL HERNIA REPAIR  1977   ILEOCECETOMY N/A 03/27/2017   Procedure: ILEOCECECTOMY;  Surgeon: Ileana Roup, MD;  Location: [facility];  Service: General;  Laterality: N/A;   INSERT / REPLACE / REMOVE PACEMAKER  07/2008   Complete heart block status post DDD with good function   IR FLUORO GUIDE CV LINE RIGHT  04/03/2019   IR US GUIDE VASC ACCESS RIGHT  04/03/2019   LAPAROTOMY N/A 03/27/2017   Procedure: EXPLORATORY LAPAROTOMY;  Surgeon: Ileana Roup, MD;  Location: [facility];  Service: General;  Laterality: N/A;   TONSILLECTOMY     UPPER GI ENDOSCOPY      Family History  Problem Relation Age of Onset   Stroke Mother     Leukemia Father    Stroke Sister    Heart attack Brother     Social History   Socioeconomic History   Marital status: Legally Separated    Spouse name: Not on file   Number of children: 1   Years of education: college   Highest education level: Not on file  Occupational History   Occupation: Retired  Tobacco Use   Smoking status: Former    Packs/day: 1.50    Years: 54.00    Pack years: 81.00    Types: Cigarettes    Quit date: 07/18/2007    Years since quitting: 13.5   Smokeless tobacco: Never  Vaping Use   Vaping Use: Never used  Substance and Sexual Activity   Alcohol use: Yes    Comment: rare   Drug use: No   Sexual activity: Never  Other Topics Concern   Not on file  Social History Narrative   Married 1994 (together since 1989) 1 son, 1 stepson. 3 grandkids, 6 great grandkids      Retired from [employer]. Had 2 years of collge.       Faith: Mormon      Here on [address] since 2004 from [place]; wife recently left and relocated to [facility]    Social Determinants of Health   Financial Resource Strain: Not on [employer] Insecurity: Not on file  Transportation Needs: Not on file  Physical Activity: Not on file  Stress: Not on file  Social Connections: Not on file  Intimate Partner Violence: Not on file    Outpatient Medications Prior to Visit  Medication Sig Dispense Refill   aspirin EC 81 MG tablet Take 81 mg by mouth daily.     atorvastatin (LIPITOR) 40 MG tablet Take 1 tablet by mouth once daily 90 tablet 1   Carboxymethylcellul-Glycerin (LUBRICATING EYE DROPS OP) Place 1 drop into both eyes 3 (three) times daily as needed (dry eyes).     carvedilol (COREG) 6.25 MG tablet Take 1 tablet (6.25 mg total) by mouth 2 (two) times daily with a meal. Hold on mornings you have dialysis and the night before 180 tablet 3   ciclopirox (PENLAC) 8 % solution      erythromycin ophthalmic ointment Place 1 application into the left eye at bedtime.     gabapentin  (NEURONTIN) 300 MG capsule TAKE 1 CAPSULE BY MOUTH AT BEDTIME FOR 7 DAYS, THEN TAKE 2 CAPSULES DAILY - MORNING AND BEDTIME THEREAFTER 90 capsule 0   glucose blood (FREESTYLE TEST STRIPS) test strip  Use to check blood sugar daily 100 each 3   glucose monitoring kit (FREESTYLE) monitoring kit USE TO MONITOR BLOOD GLUCOSE AS DIRECTED 1 each 1   lansoprazole (PREVACID) 15 MG capsule Take 1 tablet (34m) daily 90 capsule 0   mirtazapine (REMERON) 7.5 MG tablet Take 0.5-1 tablets (3.75-7.5 mg total) by mouth at bedtime. 30 tablet 5   nitroGLYCERIN (NITROSTAT) 0.4 MG SL tablet Place 1 tablet (0.4 mg total) under the tongue every 5 (five) minutes as needed for chest pain. 2 doses before calling for emergent help 25 tablet 3   oxymetazoline (AFRIN) 0.05 % nasal spray Place 1 spray into both nostrils 2 (two) times daily as needed for congestion.     saccharomyces boulardii (FLORASTOR) 250 MG capsule Take 1 capsule (250 mg total) by mouth 2 (two) times daily. 30 capsule 0   sertraline (ZOLOFT) 100 MG tablet TAKE 1 & 1/2 (ONE & ONE-HALF) TABLETS BY MOUTH ONCE DAILY 135 tablet 0   sevelamer carbonate (RENVELA) 800 MG tablet Take 1,600 mg by mouth 3 (three) times daily.     sevelamer carbonate (RENVELA) 800 MG tablet Take 1,600 mg by mouth in the morning and at bedtime.      silver sulfADIAZINE (SILVADENE) 1 % cream Apply pea-sized amount to wound daily. 50 g 0   SPS 15 GM/60ML suspension      triamcinolone cream (KENALOG) 0.1 %      vitamin B-12 (CYANOCOBALAMIN) 1000 MCG tablet Take 1,000 mcg by mouth daily.     No facility-administered medications prior to visit.    Allergies  Allergen Reactions   Bee Venom Anaphylaxis   Lyrica [Pregabalin] Other (See Comments)    hallucinations   Prednisone Other (See Comments)    hallucinations   Zocor [Simvastatin] Nausea Only and Other (See Comments)    Headache with brand name only.  Can take the generic.    Review of Systems REFER TO HPI FOR PERTINENT  POSITIVES AND NEGATIVES     Objective:    Physical Exam HENT:     Right Ear: Tympanic membrane normal. There is impacted cerumen.     Left Ear: Tympanic membrane normal. There is impacted cerumen.    BP 129/66   Pulse 78   Temp (!) 97.2 F (36.2 C)   Ht _0  (1.702 m)   Wt 215 lb 3.2 oz (97.6 kg)   SpO2 97%   BMI 33.71 kg/m  Wt Readings from Last 3 Encounters:  01/16/21 215 lb 3.2 oz (97.6 kg)  12/31/20 215 lb 9.6 oz (97.8 kg)  11/15/20 215 lb (97.5 kg)    Health Maintenance Due  Topic Date Due   FOOT EXAM  04/12/2020   COVID-19 Vaccine (4 - Booster for Pfizer series) 09/09/2020    There are no preventive care reminders to display for this patient.   Lab Results  Component Value Date   TSH 1.24 04/13/2018   Lab Results  Component Value Date   WBC 7.9 05/17/2019   HGB 6.3 (A) 07/31/2020   HCT 32 (A) 05/17/2019   MCV 92.6 04/13/2019   PLT 129 (A) 05/17/2019   Lab Results  Component Value Date   NA 140 04/13/2019   K 4.5 10/27/2019   CO2 29 04/13/2019   GLUCOSE 101 (H) 04/13/2019   BUN 72 (A) 06/09/2019   CREATININE 4.66 (HH) 04/13/2019   BILITOT 0.4 04/13/2019   ALKPHOS 127 (H) 04/13/2019   AST 26 04/13/2019   ALT 14 04/13/2019  PROT 6.1 04/13/2019   ALBUMIN 4.3 10/27/2019   CALCIUM 9.7 10/27/2019   ANIONGAP 13 04/06/2019   GFR 12.02 (LL) 04/13/2019   Lab Results  Component Value Date   CHOL 167 04/13/2018   Lab Results  Component Value Date   HDL 33.00 (L) 04/13/2018   Lab Results  Component Value Date   LDLCALC 65 06/26/2015   Lab Results  Component Value Date   TRIG 316.0 (H) 04/13/2018   Lab Results  Component Value Date   CHOLHDL 5 04/13/2018   Lab Results  Component Value Date   HGBA1C 6.3 08/07/2020       Assessment & Plan:   Problem List Items Addressed This Visit   None Visit Diagnoses     Bilateral impacted cerumen    -  Primary      1. Bilateral impacted cerumen Ceruminosis is noted. Procedure explained  and patient gave verbal consent. Wax is removed from both ear canals by syringing and manual debridement. Instructions for home care to prevent wax buildup are given. Debrox sent in for preventive treatment. Pt tolerated procedure well.   Jakaylee Sasaki M Jayana Kotula, PA-C

## 2021-01-20 DIAGNOSIS — D689 Coagulation defect, unspecified: Secondary | ICD-10-CM | POA: Diagnosis not present

## 2021-01-20 DIAGNOSIS — Z992 Dependence on renal dialysis: Secondary | ICD-10-CM | POA: Diagnosis not present

## 2021-01-20 DIAGNOSIS — N2581 Secondary hyperparathyroidism of renal origin: Secondary | ICD-10-CM | POA: Diagnosis not present

## 2021-01-20 DIAGNOSIS — N186 End stage renal disease: Secondary | ICD-10-CM | POA: Diagnosis not present

## 2021-01-20 DIAGNOSIS — E1129 Type 2 diabetes mellitus with other diabetic kidney complication: Secondary | ICD-10-CM | POA: Diagnosis not present

## 2021-01-21 ENCOUNTER — Ambulatory Visit: Payer: PPO | Admitting: Physical Therapy

## 2021-01-21 ENCOUNTER — Other Ambulatory Visit: Payer: Self-pay

## 2021-01-21 ENCOUNTER — Other Ambulatory Visit: Payer: Self-pay | Admitting: Podiatry

## 2021-01-21 DIAGNOSIS — M6281 Muscle weakness (generalized): Secondary | ICD-10-CM | POA: Diagnosis not present

## 2021-01-21 DIAGNOSIS — R2689 Other abnormalities of gait and mobility: Secondary | ICD-10-CM

## 2021-01-21 NOTE — Therapy (Signed)
[facility] [address], [place], [place], [address] Phone: [phone]   Fax:  [phone]  Physical Therapy Treatment  Patient Details  Name: Rodney Farley MRN: [id number] Date of Birth: [birth year] Referring Provider (PT): Hardie Pulley, MD   Encounter Date: 01/21/2021   PT End of Session - 01/21/21 1744     Visit Number 11    Date for PT Re-Evaluation 02/27/21    Authorization Type [employer]    Progress Note Due on Visit 20    PT Start Time 2426    PT Stop Time 8341    PT Time Calculation (min) 47 min    Activity Tolerance Patient tolerated treatment well             Past Medical History:  Diagnosis Date   AICD (automatic cardioverter/defibrillator) present    AKI (acute kidney injury) ([place]) 03/27/2017   Anemia    Anginal pain ([place])    Anxiety    Arm pain 05/08/2015   LEFT ARM   Arthritis    Atrioventricular block, complete ([place])    a. 2010 s/p pacemaker.   Bell's palsy    left side. after shingles episode   Bilateral renal cysts 07/23/2017   Simple and hemorrhagic noted on CT ab/pelvis    BPH associated with nocturia    Cardiomegaly    Chronic systolic CHF (congestive heart failure) ([place])    EF normalized by Echo 2019   CKD (chronic kidney disease), stage IV (HCC)    COPD (chronic obstructive pulmonary disease) ([place])    Severe   Coronary artery disease    a. s/p MI in 1994/1995 while in [place] s/p questionable PCI. 03/2015: progression of disease, for staged PCI.   Depression    Diabetic peripheral neuropathy (HCC)    Diarrhea    Diverticulitis    DOE (dyspnea on exertion) 03/28/2015   Full dentures    Gallstones    GERD (gastroesophageal reflux disease)    Gout    Hard of hearing    B/L   Heart murmur    History of chronic pancreatitis 07/23/2017   noted on CT abd/pelvis   History of shingles    Hypercholesterolemia    Hypertension    Ischemic cardiomyopathy     MI (myocardial infarction) ([place]) 1994; 1995   Neuropathy    IN LOWER EXTREMITIES   Nosebleed 10/06/2017   for 2 months most recent 10/06/2017   Obesity    Pacemaker    medtronic>>> MDT ICD 09/23/15   Ruptured appendicitis    Sleep apnea    "sleeps w/humidifyer when he panics and gets short of breath" (04/08/2015)   TIA (transient ischemic attack) X 3   Trigger middle finger of left hand    Type II diabetes mellitus ([place])    Wears glasses    Wears hearing aid     Past Surgical History:  Procedure Laterality Date   APPENDECTOMY     AV FISTULA PLACEMENT Right 02/07/2019   Procedure: ARTERIOVENOUS (AV) FISTULA CREATION RIGHT UPPER ARM;  Surgeon: Waynetta Sandy, MD;  Location: [facility];  Service: Vascular;  Laterality: Right;   CARDIAC CATHETERIZATION N/A 03/29/2015   Procedure: Right/Left Heart Cath and Coronary Angiography;  Surgeon: Kelcey M [name], MD;  Location: [facility];  Service: Cardiovascular;  Laterality: N/A;   [place]   "after my MI; put me on heart RX after cath"   CARDIAC CATHETERIZATION N/A 04/09/2015  Procedure: Coronary Stent Intervention;  Surgeon: Kavontae M [name], MD;  Location: [facility];  Service: Cardiovascular;  Laterality: N/A;   CATARACT EXTRACTION W/ INTRAOCULAR LENS  IMPLANT, BILATERAL     CHOLECYSTECTOMY N/A 10/13/2017   Procedure: LAPAROSCOPIC CHOLECYSTECTOMY WITH LYSIS OF ADHESIONS;  Surgeon: Ileana Roup, MD;  [facility];  Service: General;  Laterality: N/A;   COLONOSCOPY     DENTAL SURGERY     EP IMPLANTABLE DEVICE N/A 09/23/2015   MDT CRT-D, Dr. Caryl Comes   HIATAL HERNIA REPAIR  1977   ILEOCECETOMY N/A 03/27/2017   Procedure: ILEOCECECTOMY;  Surgeon: Ileana Roup, MD;  Location: [facility];  Service: General;  Laterality: N/A;   INSERT / REPLACE / REMOVE PACEMAKER  07/2008   Complete heart block status post DDD with good function   IR FLUORO GUIDE CV LINE RIGHT  04/03/2019   IR US GUIDE [place] RIGHT  04/03/2019   LAPAROTOMY N/A 03/27/2017   Procedure: EXPLORATORY LAPAROTOMY;  Surgeon: Ileana Roup, MD;  Location: [facility];  Service: General;  Laterality: N/A;   TONSILLECTOMY     UPPER GI ENDOSCOPY      There were no vitals filed for this visit.   Subjective Assessment - 01/21/21 1233     Subjective I had my most painful day of dialysis on Friday.  My neuropathy is really bothering me today.  When I took my sock off there was blood from that wound on my foot.    Pertinent History COPD, CHF, CAD, pacemaker, dialysis 3x/wk.  Fistula in right arm; Pt is hard of hearing, peripheral neuropathy    Currently in Pain? Yes    Pain Score 4     Pain Location Foot    Pain Type Chronic pain                               [facility] Adult PT Treatment/Exercise - 01/21/21 0001       Neuro Re-ed    Neuro Re-ed Details  random "Simon Says" with head turns, UE reaching; step taps, turns to challenge balance      Lumbar Exercises: Standing   Shoulder Extension Both;Strengthening;15 reps    Theraband Level (Shoulder Extension) Level 3 (Green)    Other Standing Lumbar Exercises 5# modified dead lift to knee level 2 sets of 5    Other Standing Lumbar Exercises holding 5# kettlebell while doing box stap 5x      Knee/Hip Exercises: Seated   Other Seated Knee/Hip Exercises hip flexion with 5# kettlebell on knee 10x    Other Seated Knee/Hip Exercises 5# lifts between shin to chest 15    Sit to Sand 3 sets;5 reps;without UE support   holding 5# kettle bell; 6 reps with sit to stand with 90 degree turn                     PT Short Term Goals - 01/02/21 1354       PT SHORT TERM GOAL #1   Title be independent in initial HEP    Status Achieved      PT SHORT TERM GOAL #2   Title perform TUG in < or = to 27 seconds to improve mobility and improve balance    Status Achieved      PT SHORT TERM GOAL #3   Title improve LE strength to perform sit to stand  with  moderate UE support and maintain stability upon standing    Status Achieved               PT Long Term Goals - 01/16/21 1350       PT LONG TERM GOAL #1   Title be independent in advanced HEP    Time 8    Period Weeks    Status On-going      PT LONG TERM GOAL #2   Title perform TUG in < or = to 14 seconds to improve mobility and reduce falls risk    Status Achieved      PT LONG TERM GOAL #3   Title improve LE strength to walk for 6 minutes and/or 500 feet    Time 8    Period Weeks    Status Partially Met      PT LONG TERM GOAL #4   Title demonstrate 4+/5 bil LE strength to improve safety with stepping down off the curb    Time 8    Period Weeks    Status On-going      PT LONG TERM GOAL #5   Title 5x sit to stand test (no UE use) in 14.25 sec    Status On-going      PT LONG TERM GOAL #6   Title No loss of balance with 180 turns 3/3 trials    Time 8    Period Weeks    Status On-going                   Plan - 01/21/21 1237     Clinical Impression Statement Loss of balance with turning to apply hand sanitizer upon arrival requiring max physical assist from therapist to recover.  Although he has increased foot pain today associated with neuropathy and his wound, he is able to perform seated and standing strengthening and balance ex's including kettlebell lifts and turns.  Minimal modifications needed for pain.  He reports following session generally feeling better.  Given his unsteadiness today, therapist walked with patient to his car in the handicap parking space.  When turning to speak to therapist he has another loss of balance requiring min assist to help regain balance.    Following this he was able to lift the walker into his trunk and walk to the driver's seat without assist.    Personal Factors and Comorbidities Comorbidity 3+;Time since onset of injury/illness/exacerbation;Age    Comorbidities dialysis 3x/wk, pacemaker, COPD, CHF, falls     Examination-Activity Limitations Bed Mobility;Carry;Locomotion Level;Squat;Stairs;Stand;Transfers    Stability/Clinical Decision Making Evolving/Moderate complexity    Rehab Potential Good    PT Frequency 2x / week    PT Duration 8 weeks    PT Treatment/Interventions ADLs/Self Care Home Management;Gait training;Stair training;Functional mobility training;Therapeutic activities;Therapeutic exercise;Balance training;Neuromuscular re-education;Patient/family education;Manual techniques;Passive range of motion    PT Next Visit Plan work on turns 180 degrees; holding 5# Kettlebell with 90 degree turns;   step ups 4 inch with gradual reduction in UE support    PT Home Exercise Plan Access Code: [facility]             Patient will benefit from skilled therapeutic intervention in order to improve the following deficits and impairments:  Abnormal gait, Cardiopulmonary status limiting activity, Decreased activity tolerance, Decreased strength, Postural dysfunction, Decreased range of motion, Hypomobility, Difficulty walking, Decreased mobility, Decreased balance, Decreased endurance  Visit Diagnosis: Muscle weakness (generalized)  Other abnormalities of gait and mobility     Problem  List Patient Active Problem List   Diagnosis Date Noted   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 07/29/2020   Dependence on renal dialysis ([place]) 11/13/2019   Coagulation defect, unspecified ([place]) 05/11/2019   Encounter for immunization 04/25/2019   Thrombocytopenia ([place]) 04/13/2019   Hypokalemia 04/12/2019   Anaphylactic shock, unspecified, initial encounter 04/10/2019   Anemia in chronic kidney disease 90/93/1121   Complication of vascular dialysis catheter 04/10/2019   Iron deficiency anemia, unspecified 04/10/2019   Secondary hyperparathyroidism of renal origin ([place]) 04/10/2019   Hyperkalemia, diminished renal excretion 04/01/2019   Hyperlipidemia associated with type 2 diabetes  mellitus ([place]) 04/13/2018   S/P cholecystectomy 10/13/2017   Diarrhea 05/11/2017   Ruptured appendicitis 03/26/2017   Marital stress 02/18/2017   Trigger finger 06/10/2016   BPH associated with nocturia 04/27/2016   Diabetic peripheral neuropathy (HCC)    COPD (chronic obstructive pulmonary disease) ([place])    Bell's palsy    Gout 62/44/6950   Chronic systolic CHF (congestive heart failure) ([place]) 04/08/2015   Diverticulitis 04/08/2015   DOE (dyspnea on exertion), due to significant CAD 03/28/2015   ESRD on hemodialysis ([place]) 03/04/2015   CKD (chronic kidney disease) stage 4, GFR 15-29 ml/min (HCC) 03/04/2015   CAD (coronary artery disease) 09/13/2014   GERD (gastroesophageal reflux disease) 08/07/2012   Major depression, recurrent, full remission ([place]) 08/05/2012   Sleep apnea 08/05/2012   Controlled type 2 diabetes mellitus with chronic kidney disease on chronic dialysis ([place]) 08/05/2012   Hypertension associated with diabetes ([place]) 08/05/2012   Diabetes type 2, uncontrolled ([place]) 08/05/2012   Ischemic cardiomyopathy 11/03/2011   Atrioventricular block, complete [facility])    Obesity    Pacemaker    Ruben Im, PT 01/21/21 5:48 PM Phone: [phone] Fax: [phone]  Alvera Singh 01/21/2021, 5:47 PM  [facility] [address] [place], [place], [address] Phone: [phone]   Fax:  [phone]  Name: Karina Lenderman MRN: [id number] Date of Birth: 1932/03/30

## 2021-01-22 NOTE — Telephone Encounter (Signed)
Please advise 

## 2021-01-23 ENCOUNTER — Other Ambulatory Visit: Payer: Self-pay

## 2021-01-23 ENCOUNTER — Ambulatory Visit: Payer: PPO

## 2021-01-23 DIAGNOSIS — M6281 Muscle weakness (generalized): Secondary | ICD-10-CM

## 2021-01-23 DIAGNOSIS — R2689 Other abnormalities of gait and mobility: Secondary | ICD-10-CM

## 2021-01-23 NOTE — Therapy (Addendum)
[facility] [address] [place], [place], [address] Phone: [phone]   Fax:  [phone]  Physical Therapy Treatment  Patient Details  Name: Rodney Farley MRN: [id number] Date of Birth: [birth year] Referring Provider (PT): Hardie Pulley, MD   Encounter Date: 01/23/2021   PT End of Session - 01/23/21 1302     Visit Number 12    Date for PT Re-Evaluation 02/27/21    Authorization Type [employer]    Progress Note Due on Visit 20    PT Start Time 1230    PT Stop Time 1311    PT Time Calculation (min) 41 min    Activity Tolerance Patient tolerated treatment well    Behavior During Therapy [employer] [facility] for tasks assessed/performed             Past Medical History:  Diagnosis Date   AICD (automatic cardioverter/defibrillator) present    AKI (acute kidney injury) ([place]) 03/27/2017   Anemia    Anginal pain ([place])    Anxiety    Arm pain 05/08/2015   LEFT ARM   Arthritis    Atrioventricular block, complete ([place])    a. 2010 s/p pacemaker.   Bell's palsy    left side. after shingles episode   Bilateral renal cysts 07/23/2017   Simple and hemorrhagic noted on CT ab/pelvis    BPH associated with nocturia    Cardiomegaly    Chronic systolic CHF (congestive heart failure) ([place])    EF normalized by Echo 2019   CKD (chronic kidney disease), stage IV (HCC)    COPD (chronic obstructive pulmonary disease) ([place])    Severe   Coronary artery disease    a. s/p MI in 1994/1995 while in [place] s/p questionable PCI. 03/2015: progression of disease, for staged PCI.   Depression    Diabetic peripheral neuropathy (HCC)    Diarrhea    Diverticulitis    DOE (dyspnea on exertion) 03/28/2015   Full dentures    Gallstones    GERD (gastroesophageal reflux disease)    Gout    Hard of hearing    B/L   Heart murmur    History of chronic pancreatitis 07/23/2017   noted on CT abd/pelvis   History of shingles     Hypercholesterolemia    Hypertension    Ischemic cardiomyopathy    MI (myocardial infarction) ([place]) 1994; 1995   Neuropathy    IN LOWER EXTREMITIES   Nosebleed 10/06/2017   for 2 months most recent 10/06/2017   Obesity    Pacemaker    medtronic>>> MDT ICD 09/23/15   Ruptured appendicitis    Sleep apnea    "sleeps w/humidifyer when he panics and gets short of breath" (04/08/2015)   TIA (transient ischemic attack) X 3   Trigger middle finger of left hand    Type II diabetes mellitus ([place])    Wears glasses    Wears hearing aid     Past Surgical History:  Procedure Laterality Date   APPENDECTOMY     AV FISTULA PLACEMENT Right 02/07/2019   Procedure: ARTERIOVENOUS (AV) FISTULA CREATION RIGHT UPPER ARM;  Surgeon: Waynetta Sandy, MD;  Location: [place];  Service: Vascular;  Laterality: Right;   CARDIAC CATHETERIZATION N/A 03/29/2015   Procedure: Right/Left Heart Cath and Coronary Angiography;  Surgeon: Requan M [name], MD;  Location: [facility];  Service: Cardiovascular;  Laterality: N/A;   [place]   "after my MI; put me on  heart RX after cath"   CARDIAC CATHETERIZATION N/A 04/09/2015   Procedure: Coronary Stent Intervention;  Surgeon: Mike M [name], MD;  Location: [facility];  Service: Cardiovascular;  Laterality: N/A;   CATARACT EXTRACTION W/ INTRAOCULAR LENS  IMPLANT, BILATERAL     CHOLECYSTECTOMY N/A 10/13/2017   Procedure: LAPAROSCOPIC CHOLECYSTECTOMY WITH LYSIS OF ADHESIONS;  Surgeon: Ileana Roup, MD;  [facility]: [facility];  Service: General;  Laterality: N/A;   COLONOSCOPY     DENTAL SURGERY     EP IMPLANTABLE DEVICE N/A 09/23/2015   MDT CRT-D, Dr. Caryl Comes   HIATAL HERNIA REPAIR  1977   ILEOCECETOMY N/A 03/27/2017   Procedure: ILEOCECECTOMY;  Surgeon: Ileana Roup, MD;  Location: [place];  Service: General;  Laterality: N/A;   INSERT / REPLACE / REMOVE PACEMAKER  07/2008   Complete heart block status post DDD with good function    IR FLUORO GUIDE CV LINE RIGHT  04/03/2019   IR US GUIDE [place] RIGHT  04/03/2019   LAPAROTOMY N/A 03/27/2017   Procedure: EXPLORATORY LAPAROTOMY;  Surgeon: Ileana Roup, MD;  Location: [place];  Service: General;  Laterality: N/A;   TONSILLECTOMY     UPPER GI ENDOSCOPY      There were no vitals filed for this visit.   Subjective Assessment - 01/23/21 1231     Subjective I did fine after last session.  I am able to put my socks on without a device now.  I'm still having trouble when i try to turn.    Pertinent History COPD, CHF, CAD, pacemaker, dialysis 3x/wk.  Fistula in right arm; Pt is hard of hearing, peripheral neuropathy    Patient Stated Goals improve gait, stability, improve endurance for walking    Currently in Pain? No/denies                               [facility] Adult PT Treatment/Exercise - 01/23/21 0001       Lumbar Exercises: Standing   Shoulder Extension Both;Strengthening;15 reps    Theraband Level (Shoulder Extension) Level 3 (Green)    Other Standing Lumbar Exercises 5# modified dead lift to knee level 2 sets of 5    Other Standing Lumbar Exercises holding 5# kettlebell while doing box stap 5x      Knee/Hip Exercises: Aerobic   Nustep L4 13 min- PT present to monitor for fatigue      Knee/Hip Exercises: Seated   Other Seated Knee/Hip Exercises hip flexion with 5# kettlebell on knee 10x    Other Seated Knee/Hip Exercises 5# lifts between shin to chest 15    Sit to Sand 3 sets;5 reps;without UE support   holding 5# kettle bell; 6 reps with sit to stand with 90 degree turn                Balance Exercises - 01/23/21 0001       Balance Exercises: Standing   Step Over Hurdles / Cones forward and reverse and side stepping with UE support of treadmill x 10 reps each    Other Standing Exercises Comments alternating step taps on colored discs and then on low cones to work on weight shift                 PT Short Term  Goals - 01/02/21 1354       PT SHORT TERM GOAL #1   Title be independent in  initial HEP    Status Achieved      PT SHORT TERM GOAL #2   Title perform TUG in < or = to 27 seconds to improve mobility and improve balance    Status Achieved      PT SHORT TERM GOAL #3   Title improve LE strength to perform sit to stand with moderate UE support and maintain stability upon standing    Status Achieved               PT Long Term Goals - 01/16/21 1350       PT LONG TERM GOAL #1   Title be independent in advanced HEP    Time 8    Period Weeks    Status On-going      PT LONG TERM GOAL #2   Title perform TUG in < or = to 14 seconds to improve mobility and reduce falls risk    Status Achieved      PT LONG TERM GOAL #3   Title improve LE strength to walk for 6 minutes and/or 500 feet    Time 8    Period Weeks    Status Partially Met      PT LONG TERM GOAL #4   Title demonstrate 4+/5 bil LE strength to improve safety with stepping down off the curb    Time 8    Period Weeks    Status On-going      PT LONG TERM GOAL #5   Title 5x sit to stand test (no UE use) in 14.25 sec    Status On-going      PT LONG TERM GOAL #6   Title No loss of balance with 180 turns 3/3 trials    Time 8    Period Weeks    Status On-going                   Plan - 01/23/21 1301     Clinical Impression Statement Pt with improved stability   Although he has increased foot pain today associated with neuropathy and his wound, he is able to perform seated and standing strengthening and balance ex's including kettlebell lifts and turns.  Pt did well with standing balance exercises today and required close stand by assistance and CGA for safety.  1 loss of balance with alternating taps on low cones and was able to perform with min to no UE support.  Pt continues to report that he has more independence with transfers and mobility at home and in the community.  Pt will continue to benefit from  skilled PT to address strength, endurance and balance tasks.    PT Frequency 2x / week    PT Duration 8 weeks    PT Treatment/Interventions ADLs/Self Care Home Management;Gait training;Stair training;Functional mobility training;Therapeutic activities;Therapeutic exercise;Balance training;Neuromuscular re-education;Patient/family education;Manual techniques;Passive range of motion    PT Next Visit Plan work on turns 180 degrees; holding 5# Kettlebell with 90 degree turns;   balance and weight shifting    PT Home Exercise Plan Access Code: [facility]    Recommended Other Services recert is signed             Patient will benefit from skilled therapeutic intervention in order to improve the following deficits and impairments:  Abnormal gait, Cardiopulmonary status limiting activity, Decreased activity tolerance, Decreased strength, Postural dysfunction, Decreased range of motion, Hypomobility, Difficulty walking, Decreased mobility, Decreased balance, Decreased endurance  Visit Diagnosis: Muscle weakness (generalized)  Other abnormalities  of gait and mobility     Problem List Patient Active Problem List   Diagnosis Date Noted   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 07/29/2020   Dependence on renal dialysis ([place]) 11/13/2019   Coagulation defect, unspecified ([place]) 05/11/2019   Encounter for immunization 04/25/2019   Thrombocytopenia ([place]) 04/13/2019   Hypokalemia 04/12/2019   Anaphylactic shock, unspecified, initial encounter 04/10/2019   Anemia in chronic kidney disease 29/98/0699   Complication of vascular dialysis catheter 04/10/2019   Iron deficiency anemia, unspecified 04/10/2019   Secondary hyperparathyroidism of renal origin ([place]) 04/10/2019   Hyperkalemia, diminished renal excretion 04/01/2019   Hyperlipidemia associated with type 2 diabetes mellitus ([place]) 04/13/2018   S/P cholecystectomy 10/13/2017   Diarrhea 05/11/2017   Ruptured appendicitis  03/26/2017   Marital stress 02/18/2017   Trigger finger 06/10/2016   BPH associated with nocturia 04/27/2016   Diabetic peripheral neuropathy (HCC)    COPD (chronic obstructive pulmonary disease) ([place])    Bell's palsy    Gout 96/72/2773   Chronic systolic CHF (congestive heart failure) ([place]) 04/08/2015   Diverticulitis 04/08/2015   DOE (dyspnea on exertion), due to significant CAD 03/28/2015   ESRD on hemodialysis ([place]) 03/04/2015   CKD (chronic kidney disease) stage 4, GFR 15-29 ml/min (HCC) 03/04/2015   CAD (coronary artery disease) 09/13/2014   GERD (gastroesophageal reflux disease) 08/07/2012   Major depression, recurrent, full remission ([place]) 08/05/2012   Sleep apnea 08/05/2012   Controlled type 2 diabetes mellitus with chronic kidney disease on chronic dialysis ([place]) 08/05/2012   Hypertension associated with diabetes ([place]) 08/05/2012   Diabetes type 2, uncontrolled ([place]) 08/05/2012   Ischemic cardiomyopathy 11/03/2011   Atrioventricular block, complete [facility]    Obesity    Pacemaker     Rodney Farley, PT 01/23/21 1:13 PM   [facility] [address] [place], [place], [address] Phone: [phone]   Fax:  [phone]  Name: Rodney Farley MRN: [id number] Date of Birth: 07/26/1931

## 2021-01-27 DIAGNOSIS — D689 Coagulation defect, unspecified: Secondary | ICD-10-CM | POA: Diagnosis not present

## 2021-01-27 DIAGNOSIS — D631 Anemia in chronic kidney disease: Secondary | ICD-10-CM | POA: Diagnosis not present

## 2021-01-27 DIAGNOSIS — Z992 Dependence on renal dialysis: Secondary | ICD-10-CM | POA: Diagnosis not present

## 2021-01-27 DIAGNOSIS — N186 End stage renal disease: Secondary | ICD-10-CM | POA: Diagnosis not present

## 2021-01-27 DIAGNOSIS — N2581 Secondary hyperparathyroidism of renal origin: Secondary | ICD-10-CM | POA: Diagnosis not present

## 2021-01-28 ENCOUNTER — Other Ambulatory Visit: Payer: Self-pay

## 2021-01-28 ENCOUNTER — Ambulatory Visit: Payer: PPO | Admitting: Podiatry

## 2021-01-28 ENCOUNTER — Encounter: Payer: PPO | Admitting: Physical Therapy

## 2021-01-28 DIAGNOSIS — R2689 Other abnormalities of gait and mobility: Secondary | ICD-10-CM

## 2021-01-28 DIAGNOSIS — R269 Unspecified abnormalities of gait and mobility: Secondary | ICD-10-CM | POA: Diagnosis not present

## 2021-01-28 DIAGNOSIS — E11621 Type 2 diabetes mellitus with foot ulcer: Secondary | ICD-10-CM | POA: Diagnosis not present

## 2021-01-28 DIAGNOSIS — L97412 Non-pressure chronic ulcer of right heel and midfoot with fat layer exposed: Secondary | ICD-10-CM | POA: Diagnosis not present

## 2021-01-28 NOTE — Progress Notes (Signed)
  Subjective:  Patient ID: Rodney Farley, male    DOB: 07/12/1932,  MRN: [id number]  Chief Complaint  Patient presents with   Wound Check    Follow up right foot ulcer. Pt states great improvement. Pt states he no longer has pain when when walks.    87 y.o. male presents for wound care.   Objective:  Physical Exam: Wound Location: right 1st met Wound Measurement: 0.5x0.5 Wound Base: granular Peri-wound: Calloused Exudate: None: wound tissue dry wound without warmth, erythema, signs of acute infection Assessment:   1. Diabetic ulcer of right midfoot associated with type 2 diabetes mellitus, with fat layer exposed ([place])   2. Gait disturbance   3. Balance disorder      Plan:  Patient was evaluated and treated and all questions answered.  Ulcer Right 1st metatarsal -Offload ulcer with DM shoe and insert. Awaiting new inserts - auth pending unable to dispense his shoes today. -Wound debrided as below -Dressed with SSD and band-aid.  Gait Instability, Fall risk -Patient states PT has helped him significantly. He is able to get himself up out of his chair on his own and feels less inclined to fall.   Return in about 4 weeks (around 02/25/2021).

## 2021-01-30 ENCOUNTER — Telehealth: Payer: Self-pay | Admitting: Podiatry

## 2021-01-30 ENCOUNTER — Ambulatory Visit: Payer: PPO | Admitting: Physical Therapy

## 2021-01-30 ENCOUNTER — Telehealth: Payer: Self-pay | Admitting: Physical Therapy

## 2021-01-30 NOTE — Telephone Encounter (Signed)
Received updated hta auth.. left message for pt to call to schedule an appt to pick up diabetic shoes.

## 2021-01-30 NOTE — Telephone Encounter (Signed)
Patient no-showed for appt.  Called home number and left message.  Called mobile number which was Mr. Vi son, notified that he did not come to appt which is unusual for him not to call.

## 2021-02-03 DIAGNOSIS — E876 Hypokalemia: Secondary | ICD-10-CM | POA: Diagnosis not present

## 2021-02-03 DIAGNOSIS — D689 Coagulation defect, unspecified: Secondary | ICD-10-CM | POA: Diagnosis not present

## 2021-02-03 DIAGNOSIS — N2581 Secondary hyperparathyroidism of renal origin: Secondary | ICD-10-CM | POA: Diagnosis not present

## 2021-02-03 DIAGNOSIS — N186 End stage renal disease: Secondary | ICD-10-CM | POA: Diagnosis not present

## 2021-02-03 DIAGNOSIS — Z992 Dependence on renal dialysis: Secondary | ICD-10-CM | POA: Diagnosis not present

## 2021-02-03 DIAGNOSIS — D631 Anemia in chronic kidney disease: Secondary | ICD-10-CM | POA: Diagnosis not present

## 2021-02-04 ENCOUNTER — Other Ambulatory Visit: Payer: Self-pay

## 2021-02-04 ENCOUNTER — Ambulatory Visit: Payer: PPO | Admitting: Physical Therapy

## 2021-02-04 DIAGNOSIS — M6281 Muscle weakness (generalized): Secondary | ICD-10-CM

## 2021-02-04 DIAGNOSIS — R2689 Other abnormalities of gait and mobility: Secondary | ICD-10-CM

## 2021-02-04 NOTE — Therapy (Signed)
[facility] [address] [place], [place], [address] Phone: [phone]   Fax:  [phone]  Physical Therapy Treatment  Patient Details  Name: Rodney Farley MRN: [id number] Date of Birth: March 12, 1932 Referring Provider (PT): Hardie Pulley, MD   Encounter Date: 02/04/2021   PT End of Session - 02/04/21 1309     Visit Number 13    Date for PT Re-Evaluation 02/27/21    Authorization Type [employer]    Progress Note Due on Visit 20    PT Start Time 1230    PT Stop Time 1315    PT Time Calculation (min) 45 min    Activity Tolerance Patient tolerated treatment well             Past Medical History:  Diagnosis Date   AICD (automatic cardioverter/defibrillator) present    AKI (acute kidney injury) ([place]) 03/27/2017   Anemia    Anginal pain ([place])    Anxiety    Arm pain 05/08/2015   LEFT ARM   Arthritis    Atrioventricular block, complete ([place])    a. 2010 s/p pacemaker.   Bell's palsy    left side. after shingles episode   Bilateral renal cysts 07/23/2017   Simple and hemorrhagic noted on CT ab/pelvis    BPH associated with nocturia    Cardiomegaly    Chronic systolic CHF (congestive heart failure) ([place])    EF normalized by Echo 2019   CKD (chronic kidney disease), stage IV (HCC)    COPD (chronic obstructive pulmonary disease) ([place])    Severe   Coronary artery disease    a. s/p MI in 1994/1995 while in [place] s/p questionable PCI. 03/2015: progression of disease, for staged PCI.   Depression    Diabetic peripheral neuropathy (HCC)    Diarrhea    Diverticulitis    DOE (dyspnea on exertion) 03/28/2015   Full dentures    Gallstones    GERD (gastroesophageal reflux disease)    Gout    Hard of hearing    B/L   Heart murmur    History of chronic pancreatitis 07/23/2017   noted on CT abd/pelvis   History of shingles    Hypercholesterolemia    Hypertension    Ischemic cardiomyopathy     MI (myocardial infarction) ([place]) 1994; 1995   Neuropathy    IN LOWER EXTREMITIES   Nosebleed 10/06/2017   for 2 months most recent 10/06/2017   Obesity    Pacemaker    medtronic>>> MDT ICD 09/23/15   Ruptured appendicitis    Sleep apnea    "sleeps w/humidifyer when he panics and gets short of breath" (04/08/2015)   TIA (transient ischemic attack) X 3   Trigger middle finger of left hand    Type II diabetes mellitus ([place])    Wears glasses    Wears hearing aid     Past Surgical History:  Procedure Laterality Date   APPENDECTOMY     AV FISTULA PLACEMENT Right 02/07/2019   Procedure: ARTERIOVENOUS (AV) FISTULA CREATION RIGHT UPPER ARM;  Surgeon: Waynetta Sandy, MD;  Location: [facility];  Service: Vascular;  Laterality: Right;   CARDIAC CATHETERIZATION N/A 03/29/2015   Procedure: Right/Left Heart Cath and Coronary Angiography;  Surgeon: Ashutosh M [name], MD;  Location: [facility];  Service: Cardiovascular;  Laterality: N/A;   [place]   "after my MI; put me on heart RX after cath"   CARDIAC CATHETERIZATION N/A 04/09/2015  Procedure: Coronary Stent Intervention;  Surgeon: Sigfredo M [name], MD;  Location: [facility];  Service: Cardiovascular;  Laterality: N/A;   CATARACT EXTRACTION W/ INTRAOCULAR LENS  IMPLANT, BILATERAL     CHOLECYSTECTOMY N/A 10/13/2017   Procedure: LAPAROSCOPIC CHOLECYSTECTOMY WITH LYSIS OF ADHESIONS;  Surgeon: Ileana Roup, MD;  [facility];  Service: General;  Laterality: N/A;   COLONOSCOPY     DENTAL SURGERY     EP IMPLANTABLE DEVICE N/A 09/23/2015   MDT CRT-D, Dr. Caryl Comes   HIATAL HERNIA REPAIR  1977   ILEOCECETOMY N/A 03/27/2017   Procedure: ILEOCECECTOMY;  Surgeon: Ileana Roup, MD;  Location: [facility];  Service: General;  Laterality: N/A;   INSERT / REPLACE / REMOVE PACEMAKER  07/2008   Complete heart block status post DDD with good function   IR FLUORO GUIDE CV LINE RIGHT  04/03/2019   IR US GUIDE [place] RIGHT  04/03/2019   LAPAROTOMY N/A 03/27/2017   Procedure: EXPLORATORY LAPAROTOMY;  Surgeon: Ileana Roup, MD;  Location: [facility];  Service: General;  Laterality: N/A;   TONSILLECTOMY     UPPER GI ENDOSCOPY      There were no vitals filed for this visit.   Subjective Assessment - 02/04/21 1234     Subjective A good dialysis session yesterday.  I fell onto the toilet this morning while turning.  Diabetic shoes ready on Aug 8th.    Pertinent History COPD, CHF, CAD, pacemaker, dialysis 3x/wk.  Fistula in right arm; Pt is hard of hearing, peripheral neuropathy    Patient Stated Goals improve gait, stability, improve endurance for walking    Currently in Pain? No/denies    Pain Score 0-No pain                               [facility] Adult PT Treatment/Exercise - 02/04/21 0001       Neuro Re-ed    Neuro Re-ed Details  sit to stand with turns while carrying a 5# weight;staggered stance with manual pertubations at the shoulder lateral and anterior      Lumbar Exercises: Standing   Shoulder Extension --    Theraband Level (Shoulder Extension) --    Other Standing Lumbar Exercises 5# modified dead lift to knee level 2 sets of 5    Other Standing Lumbar Exercises 90 degrees turns      Knee/Hip Exercises: Aerobic   Nustep L4 5 min- PT present to monitor for fatigue      Knee/Hip Exercises: Standing   Forward Step Up Right;Left;1 set;5 reps    Other Standing Knee Exercises step taps 30 sec      Knee/Hip Exercises: Seated   Other Seated Knee/Hip Exercises hip flexion with 5# kettlebell on knee 10x                      PT Short Term Goals - 01/02/21 1354       PT SHORT TERM GOAL #1   Title be independent in initial HEP    Status Achieved      PT SHORT TERM GOAL #2   Title perform TUG in < or = to 27 seconds to improve mobility and improve balance    Status Achieved      PT SHORT TERM GOAL #3   Title improve LE strength to perform sit to  stand with moderate UE support and maintain stability upon  standing    Status Achieved               PT Long Term Goals - 01/16/21 1350       PT LONG TERM GOAL #1   Title be independent in advanced HEP    Time 8    Period Weeks    Status On-going      PT LONG TERM GOAL #2   Title perform TUG in < or = to 14 seconds to improve mobility and reduce falls risk    Status Achieved      PT LONG TERM GOAL #3   Title improve LE strength to walk for 6 minutes and/or 500 feet    Time 8    Period Weeks    Status Partially Met      PT LONG TERM GOAL #4   Title demonstrate 4+/5 bil LE strength to improve safety with stepping down off the curb    Time 8    Period Weeks    Status On-going      PT LONG TERM GOAL #5   Title 5x sit to stand test (no UE use) in 14.25 sec    Status On-going      PT LONG TERM GOAL #6   Title No loss of balance with 180 turns 3/3 trials    Time 8    Period Weeks    Status On-going                   Plan - 02/04/21 1310     Clinical Impression Statement The patient reports continued loss of balance with turning at home (especially in the bathroom).  Treatment focus on 90 turns while holding a weight.  Left turns more difficult with a stuttering step initially but improved with repetition.  4 inch step ups require bil UE assist and min assist from therapist with right step ups.  Other ex's require CGA only.  No foot wound pain today.  Therapist monitoring response throughout treatment session.    Comorbidities dialysis 3x/wk, pacemaker, COPD, CHF, falls    Examination-Activity Limitations Bed Mobility;Carry;Locomotion Level;Squat;Stairs;Stand;Transfers    Rehab Potential Good    PT Frequency 2x / week    PT Duration 8 weeks    PT Treatment/Interventions ADLs/Self Care Home Management;Gait training;Stair training;Functional mobility training;Therapeutic activities;Therapeutic exercise;Balance training;Neuromuscular re-education;Patient/family  education;Manual techniques;Passive range of motion    PT Next Visit Plan work on turns 180 degrees; holding 5# Kettlebell with 90 degree turns especially left turns;   balance and weight shifting    PT Home Exercise Plan Access Code: [facility]             Patient will benefit from skilled therapeutic intervention in order to improve the following deficits and impairments:  Abnormal gait, Cardiopulmonary status limiting activity, Decreased activity tolerance, Decreased strength, Postural dysfunction, Decreased range of motion, Hypomobility, Difficulty walking, Decreased mobility, Decreased balance, Decreased endurance  Visit Diagnosis: Muscle weakness (generalized)  Other abnormalities of gait and mobility     Problem List Patient Active Problem List   Diagnosis Date Noted   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 07/29/2020   Dependence on renal dialysis ([place]) 11/13/2019   Coagulation defect, unspecified ([place]) 05/11/2019   Encounter for immunization 04/25/2019   Thrombocytopenia ([place]) 04/13/2019   Hypokalemia 04/12/2019   Anaphylactic shock, unspecified, initial encounter 04/10/2019   Anemia in chronic kidney disease 50/56/9794   Complication of vascular dialysis catheter 04/10/2019   Iron deficiency anemia,  unspecified 04/10/2019   Secondary hyperparathyroidism of renal origin ([place]) 04/10/2019   Hyperkalemia, diminished renal excretion 04/01/2019   Hyperlipidemia associated with type 2 diabetes mellitus ([place]) 04/13/2018   S/P cholecystectomy 10/13/2017   Diarrhea 05/11/2017   Ruptured appendicitis 03/26/2017   Marital stress 02/18/2017   Trigger finger 06/10/2016   BPH associated with nocturia 04/27/2016   Diabetic peripheral neuropathy (HCC)    COPD (chronic obstructive pulmonary disease) ([place])    Bell's palsy    Gout 91/08/8900   Chronic systolic CHF (congestive heart failure) ([place]) 04/08/2015   Diverticulitis 04/08/2015   DOE (dyspnea on  exertion), due to significant CAD 03/28/2015   ESRD on hemodialysis ([place]) 03/04/2015   CKD (chronic kidney disease) stage 4, GFR 15-29 ml/min (HCC) 03/04/2015   CAD (coronary artery disease) 09/13/2014   GERD (gastroesophageal reflux disease) 08/07/2012   Major depression, recurrent, full remission ([place]) 08/05/2012   Sleep apnea 08/05/2012   Controlled type 2 diabetes mellitus with chronic kidney disease on chronic dialysis ([place]) 08/05/2012   Hypertension associated with diabetes ([place]) 08/05/2012   Diabetes type 2, uncontrolled ([place]) 08/05/2012   Ischemic cardiomyopathy 11/03/2011   Atrioventricular block, complete [facility])    Obesity    Pacemaker    Ruben Im, PT 02/04/21 1:52 PM Phone: [phone] Fax: [phone]  Alvera Singh 02/04/2021, 1:52 PM  [facility] [address], [place], [place], [address] Phone: [phone]   Fax:  [phone]  Name: Rodney Farley MRN: [id number] Date of Birth: June 10, 1932

## 2021-02-05 ENCOUNTER — Telehealth: Payer: Self-pay

## 2021-02-05 NOTE — Telephone Encounter (Signed)
Amy is calling in from [facility] about the hospital bed, it is not meeting their criteria, also will need a peer to peer.

## 2021-02-05 NOTE — Telephone Encounter (Signed)
Amy states that the bed is not meeting the criteria. She does see that he has some weakness and frequent falls. But for it to meet criteria the patient would need help with frequent turn positions, also the condition of his skin, and a little more details on why he needs the Hospital bed. You will have to do a peer to peer for this particular situation. [phone]. She states that a NP made this decision she didn't give me her name.

## 2021-02-05 NOTE — Telephone Encounter (Signed)
Do you know anything about this? 

## 2021-02-05 NOTE — Telephone Encounter (Signed)
Please call patient and tell him he did not qualify for hospital bed unfortunately.  Was he able to get any equipment such as a trapeze bar-from my last conversation with him he thought if he could pull himself more towards the middle of his bed he would not fall out of bed anymore

## 2021-02-06 ENCOUNTER — Ambulatory Visit: Payer: PPO | Admitting: Physical Therapy

## 2021-02-06 ENCOUNTER — Other Ambulatory Visit: Payer: Self-pay

## 2021-02-06 ENCOUNTER — Ambulatory Visit: Payer: PPO | Admitting: Neurology

## 2021-02-06 VITALS — BP 129/64 | HR 76 | Ht 67.0 in | Wt 216.4 lb

## 2021-02-06 DIAGNOSIS — M6281 Muscle weakness (generalized): Secondary | ICD-10-CM | POA: Diagnosis not present

## 2021-02-06 DIAGNOSIS — R2681 Unsteadiness on feet: Secondary | ICD-10-CM | POA: Diagnosis not present

## 2021-02-06 DIAGNOSIS — E1142 Type 2 diabetes mellitus with diabetic polyneuropathy: Secondary | ICD-10-CM

## 2021-02-06 DIAGNOSIS — G5603 Carpal tunnel syndrome, bilateral upper limbs: Secondary | ICD-10-CM | POA: Diagnosis not present

## 2021-02-06 DIAGNOSIS — R2689 Other abnormalities of gait and mobility: Secondary | ICD-10-CM

## 2021-02-06 MED ORDER — LIDOCAINE 5 % EX OINT: 1.0000 "application " | TOPICAL_OINTMENT | CUTANEOUS | 5 refills | Status: DC | PRN

## 2021-02-06 NOTE — Telephone Encounter (Signed)
Called to relay message to pt and pt state he has received the hospital bed and has everything he needs.

## 2021-02-06 NOTE — Therapy (Signed)
[facility] [name], [place], [place], [address] Phone: [phone]   Fax:  [phone]  Physical Therapy Treatment  Patient Details  Name: Rodney Farley MRN: [id number] Date of Birth: [birth year] Referring Provider (PT): Hardie Pulley, MD   Encounter Date: 02/06/2021   PT End of Session - 02/06/21 1259     Visit Number 14    Date for PT Re-Evaluation 02/27/21    Authorization Type [employer]    Progress Note Due on Visit 20    PT Start Time 1215    PT Stop Time 1253    PT Time Calculation (min) 38 min    Activity Tolerance Patient tolerated treatment well             Past Medical History:  Diagnosis Date   AICD (automatic cardioverter/defibrillator) present    AKI (acute kidney injury) ([place]) 03/27/2017   Anemia    Anginal pain ([place])    Anxiety    Arm pain 05/08/2015   LEFT ARM   Arthritis    Atrioventricular block, complete ([place])    a. 2010 s/p pacemaker.   Bell's palsy    left side. after shingles episode   Bilateral renal cysts 07/23/2017   Simple and hemorrhagic noted on CT ab/pelvis    BPH associated with nocturia    Cardiomegaly    Chronic systolic CHF (congestive heart failure) ([place])    EF normalized by Echo 2019   CKD (chronic kidney disease), stage IV (HCC)    COPD (chronic obstructive pulmonary disease) ([place])    Severe   Coronary artery disease    a. s/p MI in 1994/1995 while in [place] s/p questionable PCI. 03/2015: progression of disease, for staged PCI.   Depression    Diabetic peripheral neuropathy (HCC)    Diarrhea    Diverticulitis    DOE (dyspnea on exertion) 03/28/2015   Full dentures    Gallstones    GERD (gastroesophageal reflux disease)    Gout    Hard of hearing    B/L   Heart murmur    History of chronic pancreatitis 07/23/2017   noted on CT abd/pelvis   History of shingles    Hypercholesterolemia    Hypertension    Ischemic cardiomyopathy     MI (myocardial infarction) ([place]) 1994; 1995   Neuropathy    IN LOWER EXTREMITIES   Nosebleed 10/06/2017   for 2 months most recent 10/06/2017   Obesity    Pacemaker    medtronic>>> MDT ICD 09/23/15   Ruptured appendicitis    Sleep apnea    "sleeps w/humidifyer when he panics and gets short of breath" (04/08/2015)   TIA (transient ischemic attack) X 3   Trigger middle finger of left hand    Type II diabetes mellitus ([place])    Wears glasses    Wears hearing aid     Past Surgical History:  Procedure Laterality Date   APPENDECTOMY     AV FISTULA PLACEMENT Right 02/07/2019   Procedure: ARTERIOVENOUS (AV) FISTULA CREATION RIGHT UPPER ARM;  Surgeon: Waynetta Sandy, MD;  Location: [place];  Service: Vascular;  Laterality: Right;   CARDIAC CATHETERIZATION N/A 03/29/2015   Procedure: Right/Left Heart Cath and Coronary Angiography;  Surgeon: Delane M [name], MD;  Location: [facility];  Service: Cardiovascular;  Laterality: N/A;   [place]   "after my MI; put me on heart RX after cath"   CARDIAC CATHETERIZATION N/A 04/09/2015  Procedure: Coronary Stent Intervention;  Surgeon: Warnie M [name], MD;  Location: [facility];  Service: Cardiovascular;  Laterality: N/A;   CATARACT EXTRACTION W/ INTRAOCULAR LENS  IMPLANT, BILATERAL     CHOLECYSTECTOMY N/A 10/13/2017   Procedure: LAPAROSCOPIC CHOLECYSTECTOMY WITH LYSIS OF ADHESIONS;  Surgeon: Ileana Roup, MD;  [facility];  Service: General;  Laterality: N/A;   COLONOSCOPY     DENTAL SURGERY     EP IMPLANTABLE DEVICE N/A 09/23/2015   MDT CRT-D, Dr. Caryl Comes   HIATAL HERNIA REPAIR  1977   ILEOCECETOMY N/A 03/27/2017   Procedure: ILEOCECECTOMY;  Surgeon: Ileana Roup, MD;  Location: [facility];  Service: General;  Laterality: N/A;   INSERT / REPLACE / REMOVE PACEMAKER  07/2008   Complete heart block status post DDD with good function   IR FLUORO GUIDE CV LINE RIGHT  04/03/2019   IR US GUIDE [place] RIGHT  04/03/2019   LAPAROTOMY N/A 03/27/2017   Procedure: EXPLORATORY LAPAROTOMY;  Surgeon: Ileana Roup, MD;  Location: [facility];  Service: General;  Laterality: N/A;   TONSILLECTOMY     UPPER GI ENDOSCOPY      There were no vitals filed for this visit.   Subjective Assessment - 02/06/21 1223     Subjective I had an hour long dr appt today and she said I need to be careful with driving b/c of the neuropathy.  Diabetic shoes arrive in a week.    Pertinent History COPD, CHF, CAD, pacemaker, dialysis 3x/wk.  Fistula in right arm; Pt is hard of hearing, peripheral neuropathy    Currently in Pain? No/denies    Pain Score 0-No pain                               [facility] Adult PT Treatment/Exercise - 02/06/21 0001       Therapeutic Activites    Therapeutic Activities Other Therapeutic Activities    Other Therapeutic Activities lifting walker in/out of the trunk, walking down ramp, mobility around car without his walker      Neuro Re-ed    Neuro Re-ed Details  regular and staggered stance with head turns;  weight shift side to side and front to back without UE support      Lumbar Exercises: Standing   Other Standing Lumbar Exercises 90 degrees turns holding 5# weight in 1 hand; 180 degree turn 2x      Knee/Hip Exercises: Standing   Other Standing Knee Exercises step taps 2 levels  30 sec each side      Knee/Hip Exercises: Seated   Other Seated Knee/Hip Exercises hip flexion with 5# kettlebell on knee 10x up and over orange flat cone    Other Seated Knee/Hip Exercises 10# chair sit ups 20x      Shoulder Exercises: Seated   Other Seated Exercises 5x kettlebell 2 handed press                      PT Short Term Goals - 01/02/21 1354       PT SHORT TERM GOAL #1   Title be independent in initial HEP    Status Achieved      PT SHORT TERM GOAL #2   Title perform TUG in < or = to 27 seconds to improve mobility and improve balance    Status  Achieved      PT SHORT TERM GOAL #  3   Title improve LE strength to perform sit to stand with moderate UE support and maintain stability upon standing    Status Achieved               PT Long Term Goals - 01/16/21 1350       PT LONG TERM GOAL #1   Title be independent in advanced HEP    Time 8    Period Weeks    Status On-going      PT LONG TERM GOAL #2   Title perform TUG in < or = to 14 seconds to improve mobility and reduce falls risk    Status Achieved      PT LONG TERM GOAL #3   Title improve LE strength to walk for 6 minutes and/or 500 feet    Time 8    Period Weeks    Status Partially Met      PT LONG TERM GOAL #4   Title demonstrate 4+/5 bil LE strength to improve safety with stepping down off the curb    Time 8    Period Weeks    Status On-going      PT LONG TERM GOAL #5   Title 5x sit to stand test (no UE use) in 14.25 sec    Status On-going      PT LONG TERM GOAL #6   Title No loss of balance with 180 turns 3/3 trials    Time 8    Period Weeks    Status On-going                   Plan - 02/06/21 1259     Clinical Impression Statement The patient is more fatigued today than usual having had a busy morning of appts.  He reports increased numbness in his feet with standing and therefore limiting tolerance for standing ex.  Stuttering with initial staggering of feet but not with turns today.  Treatment modified secondary to his fatigue level and LE symptoms.  CGA for safety but no physical assist need to stabilize even even with walking on sidewalk, ramp to his car and lifting his walker to put in the trunk.    Personal Factors and Comorbidities Comorbidity 3+;Time since onset of injury/illness/exacerbation;Age    Comorbidities dialysis 3x/wk, pacemaker, COPD, CHF, falls    Examination-Activity Limitations Bed Mobility;Carry;Locomotion Level;Squat;Stairs;Stand;Transfers    Rehab Potential Good    PT Frequency 2x / week    PT Duration 8 weeks     PT Treatment/Interventions ADLs/Self Care Home Management;Gait training;Stair training;Functional mobility training;Therapeutic activities;Therapeutic exercise;Balance training;Neuromuscular re-education;Patient/family education;Manual techniques;Passive range of motion    PT Next Visit Plan work on turns 180 degrees; holding 5# Kettlebell with 90 degree turns especially left turns;   balance and weight shifting    PT Home Exercise Plan Access Code: [facility]             Patient will benefit from skilled therapeutic intervention in order to improve the following deficits and impairments:  Abnormal gait, Cardiopulmonary status limiting activity, Decreased activity tolerance, Decreased strength, Postural dysfunction, Decreased range of motion, Hypomobility, Difficulty walking, Decreased mobility, Decreased balance, Decreased endurance  Visit Diagnosis: Muscle weakness (generalized)  Other abnormalities of gait and mobility     Problem List Patient Active Problem List   Diagnosis Date Noted   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 07/29/2020   Dependence on renal dialysis ([place]) 11/13/2019   Coagulation defect, unspecified ([place]) 05/11/2019  Encounter for immunization 04/25/2019   Thrombocytopenia ([place]) 04/13/2019   Hypokalemia 04/12/2019   Anaphylactic shock, unspecified, initial encounter 04/10/2019   Anemia in chronic kidney disease 94/03/8285   Complication of vascular dialysis catheter 04/10/2019   Iron deficiency anemia, unspecified 04/10/2019   Secondary hyperparathyroidism of renal origin ([place]) 04/10/2019   Hyperkalemia, diminished renal excretion 04/01/2019   Hyperlipidemia associated with type 2 diabetes mellitus ([place]) 04/13/2018   S/P cholecystectomy 10/13/2017   Diarrhea 05/11/2017   Ruptured appendicitis 03/26/2017   Marital stress 02/18/2017   Trigger finger 06/10/2016   BPH associated with nocturia 04/27/2016   Diabetic peripheral neuropathy  (HCC)    COPD (chronic obstructive pulmonary disease) ([place])    Bell's palsy    Gout 75/19/8242   Chronic systolic CHF (congestive heart failure) ([place]) 04/08/2015   Diverticulitis 04/08/2015   DOE (dyspnea on exertion), due to significant CAD 03/28/2015   ESRD on hemodialysis ([place]) 03/04/2015   CKD (chronic kidney disease) stage 4, GFR 15-29 ml/min (HCC) 03/04/2015   CAD (coronary artery disease) 09/13/2014   GERD (gastroesophageal reflux disease) 08/07/2012   Major depression, recurrent, full remission ([place]) 08/05/2012   Sleep apnea 08/05/2012   Controlled type 2 diabetes mellitus with chronic kidney disease on chronic dialysis ([place]) 08/05/2012   Hypertension associated with diabetes ([place]) 08/05/2012   Diabetes type 2, uncontrolled ([place]) 08/05/2012   Ischemic cardiomyopathy 11/03/2011   Atrioventricular block, complete [facility])    Obesity    Pacemaker    Ruben Im, PT 02/06/21 1:07 PM Phone: [phone] Fax: [phone]  Alvera Singh 02/06/2021, 1:06 PM  [facility] [address] [place], [place], [address] Phone: [phone]   Fax:  [phone]  Name: Rodney Farley MRN: [id number] Date of Birth: August 12, 1931

## 2021-02-06 NOTE — Progress Notes (Signed)
[facility] Clinic Note - Initial Visit   Date: 02/06/21  Rodney Farley MRN: [id number] DOB: [birth year]   Dear Rodney Paganini, PA-C:  Thank you for your kind referral of Rodney Farley for consultation of neuropathy. Although his history is well known to you, please allow [place] to reiterate it for the purpose of our medical record. The patient was accompanied to the clinic by self.    History of Present Illness: Rodney Farley is a delightful 87 y.o. right-handed male with diabetes mellitus, ESRD on HD, left Bell's palsy, CAD, COPD, history of complete heart block s/p PPM presenting for evaluation of neuropathy.    He started dialysis in 2020 and does not recall having significant neuropathy prior to this.  Following dialysis, he has constant numbness from the mid-foot down into the feet, worse on the right foot. He has sensation of bilateral feet swelling and heaviness Pain is worse at night time and with dialysis. He has stabbing pain in the toes, which comes in waves.  He is falling frequently and started PT for balance training.  He has started to use a rigid walker, which has significantly helped.  He also has numbness in the both the hands, which is worse in the morning and he often finds his hands locked in a claw position.  Symptoms improve within 10 minutes.   He lives alone.  His son and daughter-in-law live two blocks away and are his POA.   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  He is originally from [place].   Out-side paper records, electronic medical record, and images have been reviewed where available and summarized as:  Lab Results  Component Value Date   HGBA1C 6.3 08/07/2020   Lab Results  Component Value Date   VOHYWVPX10 626 04/13/2018   Lab Results  Component Value Date   TSH 1.24 04/13/2018   Lab Results  Component Value Date   ESRSEDRATE 97 (H) 05/10/2015    Past Medical History:   Diagnosis Date   AICD (automatic cardioverter/defibrillator) present    AKI (acute kidney injury) ([place]) 03/27/2017   Anemia    Anginal pain ([place])    Anxiety    Arm pain 05/08/2015   LEFT ARM   Arthritis    Atrioventricular block, complete ([place])    a. 2010 s/p pacemaker.   Bell's palsy    left side. after shingles episode   Bilateral renal cysts 07/23/2017   Simple and hemorrhagic noted on CT ab/pelvis    BPH associated with nocturia    Cardiomegaly    Chronic systolic CHF (congestive heart failure) ([place])    EF normalized by Echo 2019   CKD (chronic kidney disease), stage IV (HCC)    COPD (chronic obstructive pulmonary disease) ([place])    Severe   Coronary artery disease    a. s/p MI in 1994/1995 while in [place] s/p questionable PCI. 03/2015: progression of disease, for staged PCI.   Depression    Diabetic peripheral neuropathy (HCC)    Diarrhea    Diverticulitis    DOE (dyspnea on exertion) 03/28/2015   Full dentures    Gallstones    GERD (gastroesophageal reflux disease)    Gout    Hard of hearing    B/L   Heart murmur    History of chronic pancreatitis 07/23/2017   noted on CT abd/pelvis   History of shingles    Hypercholesterolemia    Hypertension    Ischemic cardiomyopathy  MI (myocardial infarction) ([place]) 1994; 1995   Neuropathy    IN LOWER EXTREMITIES   Nosebleed 10/06/2017   for 2 months most recent 10/06/2017   Obesity    Pacemaker    medtronic>>> MDT ICD 09/23/15   Ruptured appendicitis    Sleep apnea    "sleeps w/humidifyer when he panics and gets short of breath" (04/08/2015)   TIA (transient ischemic attack) X 3   Trigger middle finger of left hand    Type II diabetes mellitus ([place])    Wears glasses    Wears hearing aid     Past Surgical History:  Procedure Laterality Date   APPENDECTOMY     AV FISTULA PLACEMENT Right 02/07/2019   Procedure: ARTERIOVENOUS (AV) FISTULA CREATION RIGHT UPPER ARM;  Surgeon: Waynetta Sandy, MD;  Location:  [facility];  Service: Vascular;  Laterality: Right;   CARDIAC CATHETERIZATION N/A 03/29/2015   Procedure: Right/Left Heart Cath and Coronary Angiography;  Surgeon: Rithwik M [name], MD;  Location: [facility];  Service: Cardiovascular;  Laterality: N/A;   [place]   "after my MI; put me on heart RX after cath"   CARDIAC CATHETERIZATION N/A 04/09/2015   Procedure: Coronary Stent Intervention;  Surgeon: Bret M [name], MD;  Location: [facility];  Service: Cardiovascular;  Laterality: N/A;   CATARACT EXTRACTION W/ INTRAOCULAR LENS  IMPLANT, BILATERAL     CHOLECYSTECTOMY N/A 10/13/2017   Procedure: LAPAROSCOPIC CHOLECYSTECTOMY WITH LYSIS OF ADHESIONS;  Surgeon: Ileana Roup, MD;  [facility];  Service: General;  Laterality: N/A;   COLONOSCOPY     DENTAL SURGERY     EP IMPLANTABLE DEVICE N/A 09/23/2015   MDT CRT-D, Dr. Caryl Comes   HIATAL HERNIA REPAIR  1977   ILEOCECETOMY N/A 03/27/2017   Procedure: ILEOCECECTOMY;  Surgeon: Ileana Roup, MD;  Location: [facility];  Service: General;  Laterality: N/A;   INSERT / REPLACE / REMOVE PACEMAKER  07/2008   Complete heart block status post DDD with good function   IR FLUORO GUIDE CV LINE RIGHT  04/03/2019   IR US GUIDE VASC ACCESS RIGHT  04/03/2019   LAPAROTOMY N/A 03/27/2017   Procedure: EXPLORATORY LAPAROTOMY;  Surgeon: Ileana Roup, MD;  Location: [facility];  Service: General;  Laterality: N/A;   TONSILLECTOMY     UPPER GI ENDOSCOPY       Medications:  Outpatient Encounter Medications as of 02/06/2021  Medication Sig   aspirin EC 81 MG tablet Take 81 mg by mouth daily.   atorvastatin (LIPITOR) 40 MG tablet Take 1 tablet by mouth once daily   carbamide peroxide (DEBROX) 6.5 % OTIC solution Place 5 drops into both ears 2 (two) times daily.   Carboxymethylcellul-Glycerin (LUBRICATING EYE DROPS OP) Place 1 drop into both eyes 3 (three) times daily as needed (dry eyes).   carvedilol (COREG) 6.25 MG tablet Take 1  tablet (6.25 mg total) by mouth 2 (two) times daily with a meal. Hold on mornings you have dialysis and the night before   ciclopirox (PENLAC) 8 % solution    erythromycin ophthalmic ointment Place 1 application into the left eye at bedtime.   gabapentin (NEURONTIN) 300 MG capsule TAKE 1 CAPSULE BY MOUTH AT BEDTIME FOR 7 DAYS AND THEN 2 CAPSULES DAILY (MORNING  AND  BEDTIME)  THEREAFTER (Patient taking differently: Take 300 mg by mouth 2 (two) times daily. TAKE 1 CAPSULE BY MOUTH AT BEDTIME FOR 7 DAYS AND THEN 2 CAPSULES DAILY (  MORNING  AND  BEDTIME)  THEREAFTER)   glucose blood (FREESTYLE TEST STRIPS) test strip Use to check blood sugar daily   glucose monitoring kit (FREESTYLE) monitoring kit USE TO MONITOR BLOOD GLUCOSE AS DIRECTED   lansoprazole (PREVACID) 15 MG capsule Take 1 tablet (37m) daily   mirtazapine (REMERON) 7.5 MG tablet Take 0.5-1 tablets (3.75-7.5 mg total) by mouth at bedtime.   nitroGLYCERIN (NITROSTAT) 0.4 MG SL tablet Place 1 tablet (0.4 mg total) under the tongue every 5 (five) minutes as needed for chest pain. 2 doses before calling for emergent help   oxymetazoline (AFRIN) 0.05 % nasal spray Place 1 spray into both nostrils 2 (two) times daily as needed for congestion.   saccharomyces boulardii (FLORASTOR) 250 MG capsule Take 1 capsule (250 mg total) by mouth 2 (two) times daily.   sertraline (ZOLOFT) 100 MG tablet TAKE 1 & 1/2 (ONE & ONE-HALF) TABLETS BY MOUTH ONCE DAILY   sevelamer carbonate (RENVELA) 800 MG tablet Take 1,600 mg by mouth 3 (three) times daily.   sevelamer carbonate (RENVELA) 800 MG tablet Take 1,600 mg by mouth in the morning and at bedtime.    silver sulfADIAZINE (SILVADENE) 1 % cream Apply pea-sized amount to wound daily.   SPS 15 GM/60ML suspension    triamcinolone cream (KENALOG) 0.1 %    vitamin B-12 (CYANOCOBALAMIN) 1000 MCG tablet Take 1,000 mcg by mouth daily.   No facility-administered encounter medications on file as of 02/06/2021.     Allergies:  Allergies  Allergen Reactions   Bee Venom Anaphylaxis   Lyrica [Pregabalin] Other (See Comments)    hallucinations   Prednisone Other (See Comments)    hallucinations   Zocor [Simvastatin] Nausea Only and Other (See Comments)    Headache with brand name only.  Can take the generic.    Family History: Family History  Problem Relation Age of Onset   Stroke Mother    Leukemia Father    Stroke Sister    Heart attack Brother     Social History: Social History   Tobacco Use   Smoking status: Former    Packs/day: 1.50    Years: 54.00    Pack years: 81.00    Types: Cigarettes    Quit date: 07/18/2007    Years since quitting: 13.5   Smokeless tobacco: Never  Vaping Use   Vaping Use: Never used  Substance Use Topics   Alcohol use: Yes    Comment: rare   Drug use: No   Social History   Social History Narrative   Married 1994 (together since 1989) 1 son, 1 stepson. 3 grandkids, 6 great grandkids      Retired from [employer] Had 2 years of collge.       Faith: Mormon      [place]    Vital Signs:  BP 129/64   Pulse 76   Ht _0  (1.702 m)   Wt 216 lb 6 oz (98.1 kg)   SpO2 95%   BMI 33.89 kg/m   Neurological Exam: MENTAL STATUS including orientation to time, place, person, recent and remote memory, attention span and concentration, language, and fund of knowledge is normal.  Speech is not dysarthric.  CRANIAL NERVES: II:  No visual field defects.   III-IV-VI: Pupils equal round and reactive to light.  Normal conjugate, extra-ocular eye movements in all directions of gaze.  No nystagmus.  Moderate  left ptosis (old, left Bell's palsy).   V:  Normal facial sensation.    VII:  Mild left facial assymmetry (old, Bell's palsy).    VIII:  Normal hearing and vestibular function.   IX-X:  Normal palatal movement.   XI:  Normal shoulder shrug and head rotation.    XII:  Normal tongue strength and range of motion, no deviation or fasciculation.  MOTOR:  Mild bilateral ABP and lower leg loss of muscle bulk, fasciculations or abnormal movements.  No pronator drift.  Right upper extremity with AV fistula.  Upper Extremity:  Right  Left  Deltoid  5/5   5/5   Biceps  5/5   5/5   Triceps  5/5   5/5   Infraspinatus 5/5  5/5  Medial pectoralis 5/5  5/5  Wrist extensors  5/5   5/5   Wrist flexors  5/5   5/5   Finger extensors  5/5   5/5   Finger flexors  5/5   5/5   Dorsal interossei  5/5   5/5   Abductor pollicis  4/5   4/5   Tone ([place] scale)  0  0   Lower Extremity:  Right  Left  Hip flexors  5/5   5/5   Hip extensors  5/5   5/5   Adductor 5/5  5/5  Abductor 5/5  5/5  Knee flexors  5/5   5/5   Knee extensors  5/5   5/5   Dorsiflexors  5/5   5/5   Plantarflexors  5/5   5/5   Toe extensors  5/5   5/5   Toe flexors  5/5   5/5   Tone ([place]worth scale)  0  0   MSRs:  Right        Left                  brachioradialis 2+  2+  biceps 2+  2+  triceps 2+  2+  patellar 1+  1+  ankle jerk 0  0  Hoffman no  no  plantar response down  down   SENSORY:  Vibration trace at the ankles and absent at the great toe.  Pin prick and temperature reduced below the ankles  COORDINATION/GAIT: Normal finger-to- nose-finger.  Intact rapid alternating movements bilaterally.  Gait is assisted with rigid walker and appears stable.    IMPRESSION:  Painful neuropathy affecting the feet, contributed by diabetes and renal disease, worse after dialysis.  His neurological examination shows a distal predominant  large fiber peripheral neuropathy. I had extensive discussion with the patient regarding the pathogenesis, etiology, management, and natural course of neuropathy. Neuropathy tends to be slowly progressive I discussed that in the vast majority of cases management is symptomatic.  Unfortunately, pain relief options are limited being on dialysis.  Unable to use  cymbalta or TCA.  If pain remains severe, he may have no option than opioid therapy, which would need to go through his nephrologist or pain management. - Continue gapapentin 345m BID - Start lidocaine ointment to feet 2-3 times as needed - Continue PT for balance training - Driving precautions discussed, if he cannot safely manipulate the pedals, he knows to stop driving - He tells me that his son conducts driving test and has restricted him from driving at night and during heavy traffic times - Patient educated on daily foot inspection, fall prevention, and safety precautions around the home.  2.  Bilateral carpal tunnel syndrome is also  likely and causing his morning paresthesias.   - NCs/EMG declined - I suggest he try wearing wrist braces at night time to see if this helps.  Return to clinic in 4 months.  Total time spent reviewing records, interview, history/exam, documentation, counseling, and coordination of care on day of encounter:  60 min    Thank you for allowing me to participate in patient's care.  If I can answer any additional questions, I would be pleased to do so.    Sincerely,    Cala Kruckenberg K. Posey Pronto, DO

## 2021-02-06 NOTE — Patient Instructions (Addendum)
You may try lidocaine ointment to your feet 2-3 times daily as needed  You may also try wrist braces for possible carpal tunnel syndrome, you may wear it at night time  Continue physical therapy exercises  Return to clinic in 4 months

## 2021-02-09 DIAGNOSIS — E1129 Type 2 diabetes mellitus with other diabetic kidney complication: Secondary | ICD-10-CM | POA: Diagnosis not present

## 2021-02-09 DIAGNOSIS — N186 End stage renal disease: Secondary | ICD-10-CM | POA: Diagnosis not present

## 2021-02-09 DIAGNOSIS — Z992 Dependence on renal dialysis: Secondary | ICD-10-CM | POA: Diagnosis not present

## 2021-02-10 ENCOUNTER — Ambulatory Visit (INDEPENDENT_AMBULATORY_CARE_PROVIDER_SITE_OTHER): Payer: PPO

## 2021-02-10 DIAGNOSIS — I442 Atrioventricular block, complete: Secondary | ICD-10-CM | POA: Diagnosis not present

## 2021-02-10 DIAGNOSIS — E876 Hypokalemia: Secondary | ICD-10-CM | POA: Diagnosis not present

## 2021-02-10 DIAGNOSIS — Z992 Dependence on renal dialysis: Secondary | ICD-10-CM | POA: Diagnosis not present

## 2021-02-10 DIAGNOSIS — D631 Anemia in chronic kidney disease: Secondary | ICD-10-CM | POA: Diagnosis not present

## 2021-02-10 DIAGNOSIS — N2581 Secondary hyperparathyroidism of renal origin: Secondary | ICD-10-CM | POA: Diagnosis not present

## 2021-02-10 DIAGNOSIS — D689 Coagulation defect, unspecified: Secondary | ICD-10-CM | POA: Diagnosis not present

## 2021-02-10 DIAGNOSIS — N186 End stage renal disease: Secondary | ICD-10-CM | POA: Diagnosis not present

## 2021-02-11 ENCOUNTER — Ambulatory Visit: Payer: PPO | Attending: Podiatry

## 2021-02-11 ENCOUNTER — Other Ambulatory Visit: Payer: Self-pay

## 2021-02-11 DIAGNOSIS — R2689 Other abnormalities of gait and mobility: Secondary | ICD-10-CM | POA: Diagnosis not present

## 2021-02-11 DIAGNOSIS — M6281 Muscle weakness (generalized): Secondary | ICD-10-CM | POA: Diagnosis not present

## 2021-02-11 LAB — CUP PACEART REMOTE DEVICE CHECK
Battery Remaining Longevity: 6 mo
Battery Voltage: 2.78 V
Brady Statistic AP VP Percent: 86.31 %
Brady Statistic AP VS Percent: 0.05 %
Brady Statistic AS VP Percent: 13.56 %
Brady Statistic AS VS Percent: 0.08 %
Brady Statistic RA Percent Paced: 86.15 %
Brady Statistic RV Percent Paced: 99.7 %
Date Time Interrogation Session: 20220801033423
HighPow Impedance: 62 Ohm
Implantable Lead Implant Date: 20100112
Implantable Lead Implant Date: 20170313
Implantable Lead Implant Date: 20170313
Implantable Lead Location: 753858
Implantable Lead Location: 753859
Implantable Lead Location: 753860
Implantable Lead Model: 4598
Implantable Lead Model: 5076
Implantable Pulse Generator Implant Date: 20170313
Lead Channel Impedance Value: 361 Ohm
Lead Channel Impedance Value: 399 Ohm
Lead Channel Impedance Value: 399 Ohm
Lead Channel Impedance Value: 418 Ohm
Lead Channel Impedance Value: 456 Ohm
Lead Channel Impedance Value: 456 Ohm
Lead Channel Impedance Value: 456 Ohm
Lead Channel Impedance Value: 532 Ohm
Lead Channel Impedance Value: 703 Ohm
Lead Channel Impedance Value: 722 Ohm
Lead Channel Impedance Value: 760 Ohm
Lead Channel Impedance Value: 760 Ohm
Lead Channel Impedance Value: 760 Ohm
Lead Channel Pacing Threshold Amplitude: 0.5 V
Lead Channel Pacing Threshold Amplitude: 1.375 V
Lead Channel Pacing Threshold Amplitude: 1.75 V
Lead Channel Pacing Threshold Pulse Width: 0.4 ms
Lead Channel Pacing Threshold Pulse Width: 0.4 ms
Lead Channel Pacing Threshold Pulse Width: 0.4 ms
Lead Channel Sensing Intrinsic Amplitude: 1.125 mV
Lead Channel Sensing Intrinsic Amplitude: 1.125 mV
Lead Channel Sensing Intrinsic Amplitude: 12.375 mV
Lead Channel Sensing Intrinsic Amplitude: 12.375 mV
Lead Channel Setting Pacing Amplitude: 2.5 V
Lead Channel Setting Pacing Amplitude: 3 V
Lead Channel Setting Pacing Amplitude: 3.5 V
Lead Channel Setting Pacing Pulse Width: 0.4 ms
Lead Channel Setting Pacing Pulse Width: 0.4 ms
Lead Channel Setting Sensing Sensitivity: 0.3 mV

## 2021-02-11 NOTE — Therapy (Signed)
[facility] [address] [place], [place], [address] Phone: [phone]   Fax:  [phone]  Physical Therapy Treatment  Patient Details  Name: Rodney Farley MRN: [id number] Date of Birth: [birth year] Referring Provider (PT): Hardie Pulley, MD   Encounter Date: 02/11/2021   PT End of Session - 02/11/21 1309     Visit Number 15    Date for PT Re-Evaluation 02/27/21    Authorization Type [employer]    Progress Note Due on Visit 20    PT Start Time 1224    PT Stop Time 1309    PT Time Calculation (min) 45 min    Activity Tolerance Patient tolerated treatment well    Behavior During Therapy [facility] for tasks assessed/performed             Past Medical History:  Diagnosis Date   AICD (automatic cardioverter/defibrillator) present    AKI (acute kidney injury) ([place]) 03/27/2017   Anemia    Anginal pain ([place])    Anxiety    Arm pain 05/08/2015   LEFT ARM   Arthritis    Atrioventricular block, complete ([place])    a. 2010 s/p pacemaker.   Bell's palsy    left side. after shingles episode   Bilateral renal cysts 07/23/2017   Simple and hemorrhagic noted on CT ab/pelvis    BPH associated with nocturia    Cardiomegaly    Chronic systolic CHF (congestive heart failure) ([place])    EF normalized by Echo 2019   CKD (chronic kidney disease), stage IV (HCC)    COPD (chronic obstructive pulmonary disease) ([place])    Severe   Coronary artery disease    a. s/p MI in 1994/1995 while in [place] s/p questionable PCI. 03/2015: progression of disease, for staged PCI.   Depression    Diabetic peripheral neuropathy (HCC)    Diarrhea    Diverticulitis    DOE (dyspnea on exertion) 03/28/2015   Full dentures    Gallstones    GERD (gastroesophageal reflux disease)    Gout    Hard of hearing    B/L   Heart murmur    History of chronic pancreatitis 07/23/2017   noted on CT abd/pelvis   History of shingles     Hypercholesterolemia    Hypertension    Ischemic cardiomyopathy    MI (myocardial infarction) ([place]) 1994; 1995   Neuropathy    IN LOWER EXTREMITIES   Nosebleed 10/06/2017   for 2 months most recent 10/06/2017   Obesity    Pacemaker    medtronic>>> MDT ICD 09/23/15   Ruptured appendicitis    Sleep apnea    "sleeps w/humidifyer when he panics and gets short of breath" (04/08/2015)   TIA (transient ischemic attack) X 3   Trigger middle finger of left hand    Type II diabetes mellitus ([place])    Wears glasses    Wears hearing aid     Past Surgical History:  Procedure Laterality Date   APPENDECTOMY     AV FISTULA PLACEMENT Right 02/07/2019   Procedure: ARTERIOVENOUS (AV) FISTULA CREATION RIGHT UPPER ARM;  Surgeon: Waynetta Sandy, MD;  Location: [facility];  Service: Vascular;  Laterality: Right;   CARDIAC CATHETERIZATION N/A 03/29/2015   Procedure: Right/Left Heart Cath and Coronary Angiography;  Surgeon: Terik M [name], MD;  Location: [facility];  Service: Cardiovascular;  Laterality: N/A;   [place]   "after my MI; put me on  heart RX after cath"   CARDIAC CATHETERIZATION N/A 04/09/2015   Procedure: Coronary Stent Intervention;  Surgeon: Devean M [name], MD;  Location: [facility];  Service: Cardiovascular;  Laterality: N/A;   CATARACT EXTRACTION W/ INTRAOCULAR LENS  IMPLANT, BILATERAL     CHOLECYSTECTOMY N/A 10/13/2017   Procedure: LAPAROSCOPIC CHOLECYSTECTOMY WITH LYSIS OF ADHESIONS;  Surgeon: Ileana Roup, MD;  [facility];  Service: General;  Laterality: N/A;   COLONOSCOPY     DENTAL SURGERY     EP IMPLANTABLE DEVICE N/A 09/23/2015   MDT CRT-D, Dr. Caryl Comes   HIATAL HERNIA REPAIR  1977   ILEOCECETOMY N/A 03/27/2017   Procedure: ILEOCECECTOMY;  Surgeon: Ileana Roup, MD;  Location: [facility];  Service: General;  Laterality: N/A;   INSERT / REPLACE / REMOVE PACEMAKER  07/2008   Complete heart block status post DDD with good function    IR FLUORO GUIDE CV LINE RIGHT  04/03/2019   IR US GUIDE [place] RIGHT  04/03/2019   LAPAROTOMY N/A 03/27/2017   Procedure: EXPLORATORY LAPAROTOMY;  Surgeon: Ileana Roup, MD;  Location: [facility];  Service: General;  Laterality: N/A;   TONSILLECTOMY     UPPER GI ENDOSCOPY      There were no vitals filed for this visit.   Subjective Assessment - 02/11/21 1229     Subjective I cleaned my house this weekend.    Currently in Pain? No/denies                               [facility] Adult PT Treatment/Exercise - 02/11/21 0001       Neuro Re-ed    Neuro Re-ed Details  regular and staggered stance with head turns;  weight shift side to side and front to back without UE support      Lumbar Exercises: Standing   Other Standing Lumbar Exercises 90 degrees turns holding 5# weight in 1 hand; 180 degree turn 2x      Knee/Hip Exercises: Aerobic   Nustep Level 3x 10 min-      Knee/Hip Exercises: Standing   Other Standing Knee Exercises step taps on to foam pad 2x10 bil each with and without UE support      Knee/Hip Exercises: Seated   Other Seated Knee/Hip Exercises hip flexion with 5# kettlebell on knee 10x up and over orange flat cone    Other Seated Knee/Hip Exercises 10# chair sit ups 20x      Shoulder Exercises: Seated   Other Seated Exercises 5x kettlebell 2 handed press                      PT Short Term Goals - 01/02/21 1354       PT SHORT TERM GOAL #1   Title be independent in initial HEP    Status Achieved      PT SHORT TERM GOAL #2   Title perform TUG in < or = to 27 seconds to improve mobility and improve balance    Status Achieved      PT SHORT TERM GOAL #3   Title improve LE strength to perform sit to stand with moderate UE support and maintain stability upon standing    Status Achieved               PT Long Term Goals - 01/16/21 1350       PT LONG TERM GOAL #1  Title be independent in advanced HEP    Time 8     Period Weeks    Status On-going      PT LONG TERM GOAL #2   Title perform TUG in < or = to 14 seconds to improve mobility and reduce falls risk    Status Achieved      PT LONG TERM GOAL #3   Title improve LE strength to walk for 6 minutes and/or 500 feet    Time 8    Period Weeks    Status Partially Met      PT LONG TERM GOAL #4   Title demonstrate 4+/5 bil LE strength to improve safety with stepping down off the curb    Time 8    Period Weeks    Status On-going      PT LONG TERM GOAL #5   Title 5x sit to stand test (no UE use) in 14.25 sec    Status On-going      PT LONG TERM GOAL #6   Title No loss of balance with 180 turns 3/3 trials    Time 8    Period Weeks    Status On-going                   Plan - 02/11/21 1242     Clinical Impression Statement Pt continues to work on gait, balance and change of directions at home and in the clinic to improve safety at home and in the community.  Pt remains challenged with turning and change of direction.  Pt did well with CGA for safety and no physical assist need to stabilize with turns or with walking on sidewalk, ramp to his car and lifting his walker to put in the trunk. Pt demonstrated good control with turning 90 and 180 degrees while holding 5# weight today.  Slow progress with PT due to complex medical history.  Pt will continue to benefit from skilled PT to address balance, strength and endurance.    PT Frequency 2x / week    PT Duration 8 weeks    PT Treatment/Interventions ADLs/Self Care Home Management;Gait training;Stair training;Functional mobility training;Therapeutic activities;Therapeutic exercise;Balance training;Neuromuscular re-education;Patient/family education;Manual techniques;Passive range of motion    PT Next Visit Plan work on turns 180 degrees; holding 5# Kettlebell with 90 degree turns especially left turns;   balance and weight shifting    PT Home Exercise Plan Access Code: [facility]    Consulted  and Agree with Plan of Care Patient             Patient will benefit from skilled therapeutic intervention in order to improve the following deficits and impairments:  Abnormal gait, Cardiopulmonary status limiting activity, Decreased activity tolerance, Decreased strength, Postural dysfunction, Decreased range of motion, Hypomobility, Difficulty walking, Decreased mobility, Decreased balance, Decreased endurance  Visit Diagnosis: Muscle weakness (generalized)  Other abnormalities of gait and mobility     Problem List Patient Active Problem List   Diagnosis Date Noted   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 07/29/2020   Dependence on renal dialysis ([place]) 11/13/2019   Coagulation defect, unspecified ([place]) 05/11/2019   Encounter for immunization 04/25/2019   Thrombocytopenia ([place]) 04/13/2019   Hypokalemia 04/12/2019   Anaphylactic shock, unspecified, initial encounter 04/10/2019   Anemia in chronic kidney disease 16/38/4536   Complication of vascular dialysis catheter 04/10/2019   Iron deficiency anemia, unspecified 04/10/2019   Secondary hyperparathyroidism of renal origin ([place]) 04/10/2019   Hyperkalemia, diminished renal excretion  04/01/2019   Hyperlipidemia associated with type 2 diabetes mellitus ([place]) 04/13/2018   S/P cholecystectomy 10/13/2017   Diarrhea 05/11/2017   Ruptured appendicitis 03/26/2017   Marital stress 02/18/2017   Trigger finger 06/10/2016   BPH associated with nocturia 04/27/2016   Diabetic peripheral neuropathy (HCC)    COPD (chronic obstructive pulmonary disease) ([place])    Bell's palsy    Gout 09/79/4997   Chronic systolic CHF (congestive heart failure) ([place]) 04/08/2015   Diverticulitis 04/08/2015   DOE (dyspnea on exertion), due to significant CAD 03/28/2015   ESRD on hemodialysis ([place]) 03/04/2015   CKD (chronic kidney disease) stage 4, GFR 15-29 ml/min (HCC) 03/04/2015   CAD (coronary artery disease) 09/13/2014   GERD  (gastroesophageal reflux disease) 08/07/2012   Major depression, recurrent, full remission ([place]) 08/05/2012   Sleep apnea 08/05/2012   Controlled type 2 diabetes mellitus with chronic kidney disease on chronic dialysis ([place]) 08/05/2012   Hypertension associated with diabetes ([place]) 08/05/2012   Diabetes type 2, uncontrolled ([place]) 08/05/2012   Ischemic cardiomyopathy 11/03/2011   Atrioventricular block, complete [facility])    Obesity    Pacemaker     Sigurd Sos, PT 02/11/21 1:11 PM  [facility] [address], [place], [place], [address] Phone: [phone]   Fax:  [phone]  Name: Rodney Farley MRN: [id number] Date of Birth: 1932/06/21

## 2021-02-13 ENCOUNTER — Other Ambulatory Visit: Payer: PPO

## 2021-02-13 ENCOUNTER — Telehealth: Payer: Self-pay | Admitting: *Deleted

## 2021-02-13 ENCOUNTER — Ambulatory Visit: Payer: PPO

## 2021-02-13 ENCOUNTER — Other Ambulatory Visit: Payer: Self-pay

## 2021-02-13 DIAGNOSIS — R2689 Other abnormalities of gait and mobility: Secondary | ICD-10-CM

## 2021-02-13 DIAGNOSIS — E11621 Type 2 diabetes mellitus with foot ulcer: Secondary | ICD-10-CM

## 2021-02-13 DIAGNOSIS — M6281 Muscle weakness (generalized): Secondary | ICD-10-CM | POA: Diagnosis not present

## 2021-02-13 DIAGNOSIS — L84 Corns and callosities: Secondary | ICD-10-CM

## 2021-02-13 DIAGNOSIS — I70234 Atherosclerosis of native arteries of right leg with ulceration of heel and midfoot: Secondary | ICD-10-CM

## 2021-02-13 NOTE — Telephone Encounter (Signed)
PUDS-02/13/21

## 2021-02-13 NOTE — Therapy (Addendum)
[facility] [address], [place], [place], [address] Phone: [phone]   Fax:  [phone]  Physical Therapy Treatment  Patient Details  Name: Rodney Farley MRN: [id number] Date of Birth: 1931/11/09 Referring Provider (PT): Hardie Pulley, MD   Encounter Date: 02/13/2021   PT End of Session - 02/13/21 1303     Visit Number 16    Date for PT Re-Evaluation 02/27/21    Authorization Type [employer]    Progress Note Due on Visit 20    PT Start Time 7121    PT Stop Time 1305    PT Time Calculation (min) 35 min    Activity Tolerance Patient tolerated treatment well    Behavior During Therapy [facility] for tasks assessed/performed             Past Medical History:  Diagnosis Date   AICD (automatic cardioverter/defibrillator) present    AKI (acute kidney injury) ([place]) 03/27/2017   Anemia    Anginal pain ([place])    Anxiety    Arm pain 05/08/2015   LEFT ARM   Arthritis    Atrioventricular block, complete ([place])    a. 2010 s/p pacemaker.   Bell's palsy    left side. after shingles episode   Bilateral renal cysts 07/23/2017   Simple and hemorrhagic noted on CT ab/pelvis    BPH associated with nocturia    Cardiomegaly    Chronic systolic CHF (congestive heart failure) ([place])    EF normalized by Echo 2019   CKD (chronic kidney disease), stage IV (HCC)    COPD (chronic obstructive pulmonary disease) ([place])    Severe   Coronary artery disease    a. s/p MI in 1994/1995 while in [place] s/p questionable PCI. 03/2015: progression of disease, for staged PCI.   Depression    Diabetic peripheral neuropathy (HCC)    Diarrhea    Diverticulitis    DOE (dyspnea on exertion) 03/28/2015   Full dentures    Gallstones    GERD (gastroesophageal reflux disease)    Gout    Hard of hearing    B/L   Heart murmur    History of chronic pancreatitis 07/23/2017   noted on CT abd/pelvis   History of shingles     Hypercholesterolemia    Hypertension    Ischemic cardiomyopathy    MI (myocardial infarction) ([place]) 1994; 1995   Neuropathy    IN LOWER EXTREMITIES   Nosebleed 10/06/2017   for 2 months most recent 10/06/2017   Obesity    Pacemaker    medtronic>>> MDT ICD 09/23/15   Ruptured appendicitis    Sleep apnea    "sleeps w/humidifyer when he panics and gets short of breath" (04/08/2015)   TIA (transient ischemic attack) X 3   Trigger middle finger of left hand    Type II diabetes mellitus ([place])    Wears glasses    Wears hearing aid     Past Surgical History:  Procedure Laterality Date   APPENDECTOMY     AV FISTULA PLACEMENT Right 02/07/2019   Procedure: ARTERIOVENOUS (AV) FISTULA CREATION RIGHT UPPER ARM;  Surgeon: Waynetta Sandy, MD;  Location: [facility];  Service: Vascular;  Laterality: Right;   CARDIAC CATHETERIZATION N/A 03/29/2015   Procedure: Right/Left Heart Cath and Coronary Angiography;  Surgeon: Scorpio M [name], MD;  Location: [facility];  Service: Cardiovascular;  Laterality: N/A;   [place]   "after my MI; put me on  heart RX after cath"   CARDIAC CATHETERIZATION N/A 04/09/2015   Procedure: Coronary Stent Intervention;  Surgeon: Greogry M [name], MD;  Location: [facility];  Service: Cardiovascular;  Laterality: N/A;   CATARACT EXTRACTION W/ INTRAOCULAR LENS  IMPLANT, BILATERAL     CHOLECYSTECTOMY N/A 10/13/2017   Procedure: LAPAROSCOPIC CHOLECYSTECTOMY WITH LYSIS OF ADHESIONS;  Surgeon: Ileana Roup, MD;  [facility];  Service: General;  Laterality: N/A;   COLONOSCOPY     DENTAL SURGERY     EP IMPLANTABLE DEVICE N/A 09/23/2015   MDT CRT-D, Dr. Caryl Comes   HIATAL HERNIA REPAIR  1977   ILEOCECETOMY N/A 03/27/2017   Procedure: ILEOCECECTOMY;  Surgeon: Ileana Roup, MD;  Location: [facility];  Service: General;  Laterality: N/A;   INSERT / REPLACE / REMOVE PACEMAKER  07/2008   Complete heart block status post DDD with good function    IR FLUORO GUIDE CV LINE RIGHT  04/03/2019   IR US GUIDE [place] RIGHT  04/03/2019   LAPAROTOMY N/A 03/27/2017   Procedure: EXPLORATORY LAPAROTOMY;  Surgeon: Ileana Roup, MD;  Location: [facility];  Service: General;  Laterality: N/A;   TONSILLECTOMY     UPPER GI ENDOSCOPY      There were no vitals filed for this visit.   Subjective Assessment - 02/13/21 1234     Subjective I am doing well and I am doing my exercises to get strong.    Currently in Pain? No/denies                               [facility] Adult PT Treatment/Exercise - 02/13/21 0001       Neuro Re-ed    Neuro Re-ed Details  regular and staggered stance with head turns;  weight shift side to side and front to back without UE support      Lumbar Exercises: Standing   Other Standing Lumbar Exercises 90 degrees turns holding 5# weight in 1 hand; 180 degree turn 2x      Knee/Hip Exercises: Aerobic   Nustep Level 3x 10 min-PT present to discuss progress      Knee/Hip Exercises: Standing   Other Standing Knee Exercises step taps on to foam pad 2x10 bil each with and without UE support      Knee/Hip Exercises: Seated   Other Seated Knee/Hip Exercises hip flexion with 5# kettlebell on knee 10x up and over orange flat cone    Other Seated Knee/Hip Exercises 10# chair sit ups 20x      Shoulder Exercises: Seated   Other Seated Exercises 5x kettlebell 2 handed press 2x10                      PT Short Term Goals - 01/02/21 1354       PT SHORT TERM GOAL #1   Title be independent in initial HEP    Status Achieved      PT SHORT TERM GOAL #2   Title perform TUG in < or = to 27 seconds to improve mobility and improve balance    Status Achieved      PT SHORT TERM GOAL #3   Title improve LE strength to perform sit to stand with moderate UE support and maintain stability upon standing    Status Achieved               PT Long Term Goals - 01/16/21  [place]  #1   Title be independent in advanced HEP    Time 8    Period Weeks    Status On-going      PT LONG TERM GOAL #2   Title perform TUG in < or = to 14 seconds to improve mobility and reduce falls risk    Status Achieved      PT LONG TERM GOAL #3   Title improve LE strength to walk for 6 minutes and/or 500 feet    Time 8    Period Weeks    Status Partially Met      PT LONG TERM GOAL #4   Title demonstrate 4+/5 bil LE strength to improve safety with stepping down off the curb    Time 8    Period Weeks    Status On-going      PT LONG TERM GOAL #5   Title 5x sit to stand test (no UE use) in 14.25 sec    Status On-going      PT LONG TERM GOAL #6   Title No loss of balance with 180 turns 3/3 trials    Time 8    Period Weeks    Status On-going                   Plan - 02/13/21 1248     Clinical Impression Statement Pt continues to work on gait, balance and change of directions at home and in the clinic to improve safety at home and in the community.  Pt remains challenged with turning and change of direction.  Pt did well with CGA for safety and no physical assist need to stabilize with turns or with walking on sidewalk, ramp to his car and lifting his walker to put in the trunk. Pt demonstrated good control with turning 90 and 180 degrees while holding 5# weight today.  Slow progress with PT due to complex medical history.  Pt will continue to benefit from skilled PT to address balance, strength and endurance.  2 more weeks probable.    PT Treatment/Interventions ADLs/Self Care Home Management;Gait training;Stair training;Functional mobility training;Therapeutic activities;Therapeutic exercise;Balance training;Neuromuscular re-education;Patient/family education;Manual techniques;Passive range of motion    PT Next Visit Plan work on turns 180 degrees; holding 5# Kettlebell with 90 degree turns especially left turns;   balance and weight shifting. 2 more weeks probable.    PT  Home Exercise Plan Access Code: [facility]    Consulted and Agree with Plan of Care Patient             Patient will benefit from skilled therapeutic intervention in order to improve the following deficits and impairments:  Abnormal gait, Cardiopulmonary status limiting activity, Decreased activity tolerance, Decreased strength, Postural dysfunction, Decreased range of motion, Hypomobility, Difficulty walking, Decreased mobility, Decreased balance, Decreased endurance  Visit Diagnosis: Other abnormalities of gait and mobility  Muscle weakness (generalized)     Problem List Patient Active Problem List   Diagnosis Date Noted   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 07/29/2020   Dependence on renal dialysis ([place]) 11/13/2019   Coagulation defect, unspecified ([place]) 05/11/2019   Encounter for immunization 04/25/2019   Thrombocytopenia ([place]) 04/13/2019   Hypokalemia 04/12/2019   Anaphylactic shock, unspecified, initial encounter 04/10/2019   Anemia in chronic kidney disease 43/32/9518   Complication of vascular dialysis catheter 04/10/2019   Iron deficiency anemia, unspecified 04/10/2019   Secondary hyperparathyroidism of renal origin (  [place]) 04/10/2019   Hyperkalemia, diminished renal excretion 04/01/2019   Hyperlipidemia associated with type 2 diabetes mellitus ([place]) 04/13/2018   S/P cholecystectomy 10/13/2017   Diarrhea 05/11/2017   Ruptured appendicitis 03/26/2017   Marital stress 02/18/2017   Trigger finger 06/10/2016   BPH associated with nocturia 04/27/2016   Diabetic peripheral neuropathy (HCC)    COPD (chronic obstructive pulmonary disease) ([place])    Bell's palsy    Gout 30/94/0768   Chronic systolic CHF (congestive heart failure) ([place]) 04/08/2015   Diverticulitis 04/08/2015   DOE (dyspnea on exertion), due to significant CAD 03/28/2015   ESRD on hemodialysis ([place]) 03/04/2015   CKD (chronic kidney disease) stage 4, GFR 15-29 ml/min (HCC) 03/04/2015    CAD (coronary artery disease) 09/13/2014   GERD (gastroesophageal reflux disease) 08/07/2012   Major depression, recurrent, full remission ([place]) 08/05/2012   Sleep apnea 08/05/2012   Controlled type 2 diabetes mellitus with chronic kidney disease on chronic dialysis ([place]) 08/05/2012   Hypertension associated with diabetes ([place]) 08/05/2012   Diabetes type 2, uncontrolled ([place]) 08/05/2012   Ischemic cardiomyopathy 11/03/2011   Atrioventricular block, complete [facility])    Obesity    Pacemaker     Sigurd Sos, PT 02/13/21 1:07 PM  [facility] [address], [place], [place], [address] Phone: [phone]   Fax:  [phone]  Name: Fransico Sciandra MRN: [id number] Date of Birth: 04-Jan-1932

## 2021-02-14 ENCOUNTER — Encounter: Payer: Self-pay | Admitting: Podiatry

## 2021-02-17 DIAGNOSIS — D689 Coagulation defect, unspecified: Secondary | ICD-10-CM | POA: Diagnosis not present

## 2021-02-17 DIAGNOSIS — N186 End stage renal disease: Secondary | ICD-10-CM | POA: Diagnosis not present

## 2021-02-17 DIAGNOSIS — D631 Anemia in chronic kidney disease: Secondary | ICD-10-CM | POA: Diagnosis not present

## 2021-02-17 DIAGNOSIS — Z992 Dependence on renal dialysis: Secondary | ICD-10-CM | POA: Diagnosis not present

## 2021-02-17 DIAGNOSIS — N2581 Secondary hyperparathyroidism of renal origin: Secondary | ICD-10-CM | POA: Diagnosis not present

## 2021-02-18 ENCOUNTER — Other Ambulatory Visit: Payer: Self-pay

## 2021-02-18 ENCOUNTER — Ambulatory Visit: Payer: PPO

## 2021-02-18 DIAGNOSIS — R2689 Other abnormalities of gait and mobility: Secondary | ICD-10-CM

## 2021-02-18 DIAGNOSIS — M6281 Muscle weakness (generalized): Secondary | ICD-10-CM | POA: Diagnosis not present

## 2021-02-18 NOTE — Therapy (Signed)
[facility] [address], [address] [place], [place], [address] Phone: [phone]   Fax:  [phone]  Physical Therapy Treatment  Patient Details  Name: Rodney Farley MRN: [id number] Date of Birth: 08/03/1931 Referring Provider (PT): Price, Michael, MD   Encounter Date: 02/18/2021   PT End of Session - 02/18/21 1306     Visit Number 17    Date for PT Re-Evaluation 02/27/21    Authorization Type [employer]    Progress Note Due on Visit 20    PT Start Time 1232    PT Stop Time 1310    PT Time Calculation (min) 38 min    Activity Tolerance Patient tolerated treatment well    Behavior During Therapy WFL for tasks assessed/performed             Past Medical History:  Diagnosis Date   AICD (automatic cardioverter/defibrillator) present    AKI (acute kidney injury) (HCC) 03/27/2017   Anemia    Anginal pain (HCC)    Anxiety    Arm pain 05/08/2015   LEFT ARM   Arthritis    Atrioventricular block, complete (HCC)    a. 2010 s/p pacemaker.   Bell's palsy    left side. after shingles episode   Bilateral renal cysts 07/23/2017   Simple and hemorrhagic noted on CT ab/pelvis    BPH associated with nocturia    Cardiomegaly    Chronic systolic CHF (congestive heart failure) (HCC)    EF normalized by Echo 2019   CKD (chronic kidney disease), stage IV (HCC)    COPD (chronic obstructive pulmonary disease) (HCC)    Severe   Coronary artery disease    a. s/p MI in 1994/1995 while in [place] s/p questionable PCI. 03/2015: progression of disease, for staged PCI.   Depression    Diabetic peripheral neuropathy (HCC)    Diarrhea    Diverticulitis    DOE (dyspnea on exertion) 03/28/2015   Full dentures    Gallstones    GERD (gastroesophageal reflux disease)    Gout    Hard of hearing    B/L   Heart murmur    History of chronic pancreatitis 07/23/2017   noted on CT abd/pelvis   History of shingles     Hypercholesterolemia    Hypertension    Ischemic cardiomyopathy    MI (myocardial infarction) (HCC) 1994; 1995   Neuropathy    IN LOWER EXTREMITIES   Nosebleed 10/06/2017   for 2 months most recent 10/06/2017   Obesity    Pacemaker    medtronic>>> MDT ICD 09/23/15   Ruptured appendicitis    Sleep apnea    "sleeps w/humidifyer when he panics and gets short of breath" (04/08/2015)   TIA (transient ischemic attack) X 3   Trigger middle finger of left hand    Type II diabetes mellitus (HCC)    Wears glasses    Wears hearing aid     Past Surgical History:  Procedure Laterality Date   APPENDECTOMY     AV FISTULA PLACEMENT Right 02/07/2019   Procedure: ARTERIOVENOUS (AV) FISTULA CREATION RIGHT UPPER ARM;  Surgeon: Cain, Brandon Christopher, MD;  Location: [facility];  Service: Vascular;  Laterality: Right;   CARDIAC CATHETERIZATION N/A 03/29/2015   Procedure: Right/Left Heart Cath and Coronary Angiography;  Surgeon: Roshard M Jordan, MD;  [facility]: [facility];  Service: Cardiovascular;  Laterality: N/A;   CARDIAC CATHETERIZATION  1995   "after my MI; put me on   heart RX after cath"   CARDIAC CATHETERIZATION N/A 04/09/2015   Procedure: Coronary Stent Intervention;  Surgeon: Jimi M [name], MD;  Location: [facility] CV [facility];  Service: Cardiovascular;  Laterality: N/A;   CATARACT EXTRACTION W/ INTRAOCULAR LENS  IMPLANT, BILATERAL     CHOLECYSTECTOMY N/A 10/13/2017   Procedure: LAPAROSCOPIC CHOLECYSTECTOMY WITH LYSIS OF ADHESIONS;  Surgeon: Ileana Roup, MD;  Location: [facility];  Service: General;  Laterality: N/A;   COLONOSCOPY     DENTAL SURGERY     EP IMPLANTABLE DEVICE N/A 09/23/2015   MDT CRT-D, Dr. Caryl Comes   HIATAL HERNIA REPAIR  1977   ILEOCECETOMY N/A 03/27/2017   Procedure: ILEOCECECTOMY;  Surgeon: Ileana Roup, MD;  Location: [facility];  Service: General;  Laterality: N/A;   INSERT / REPLACE / REMOVE PACEMAKER  07/2008   Complete heart block status post DDD with good function    IR FLUORO GUIDE CV LINE RIGHT  04/03/2019   IR US GUIDE [place] RIGHT  04/03/2019   LAPAROTOMY N/A 03/27/2017   Procedure: EXPLORATORY LAPAROTOMY;  Surgeon: Ileana Roup, MD;  Location: [facility];  Service: General;  Laterality: N/A;   TONSILLECTOMY     UPPER GI ENDOSCOPY      There were no vitals filed for this visit.   Subjective Assessment - 02/18/21 1236     Subjective I love my new shoes.  I didn't sleep at all last night so I am tired but I am looking forward to my exercises.    Currently in Pain? No/denies                               [facility] Adult PT Treatment/Exercise - 02/18/21 0001       Neuro Re-ed    Neuro Re-ed Details  regular and staggered stance with weight shift side to side and front to back with UE support- standing on balance pad      Lumbar Exercises: Standing   Other Standing Lumbar Exercises 90 degrees turns holding 5# weight in 1 hand; 180 degree turn 2x      Knee/Hip Exercises: Aerobic   Nustep Level 3x 10 min-PT present to discuss progress      Knee/Hip Exercises: Standing   Other Standing Knee Exercises step taps on to foam pad 2x10 bil each with and without UE support      Knee/Hip Exercises: Seated   Other Seated Knee/Hip Exercises hip flexion with 5# kettlebell on knee 10x up and over orange flat cone    Other Seated Knee/Hip Exercises 10# chair sit ups 20x                 Balance Exercises - 02/18/21 0001       Balance Exercises: Standing   Step Over Hurdles / Cones sidestepping with min UE support over hurdle x5 each    Other Standing Exercises Comments alternating step taps on colored discs and then on low cones to work on weight shift                 PT Short Term Goals - 01/02/21 1354       PT SHORT TERM GOAL #1   Title be independent in initial HEP    Status Achieved      PT SHORT TERM GOAL #2   Title perform TUG in < or = to 27 seconds to improve mobility and improve balance  Status Achieved      PT SHORT TERM GOAL #3   Title improve LE strength to perform sit to stand with moderate UE support and maintain stability upon standing    Status Achieved               PT Long Term Goals - 01/16/21 1350       PT LONG TERM GOAL #1   Title be independent in advanced HEP    Time 8    Period Weeks    Status On-going      PT LONG TERM GOAL #2   Title perform TUG in < or = to 14 seconds to improve mobility and reduce falls risk    Status Achieved      PT LONG TERM GOAL #3   Title improve LE strength to walk for 6 minutes and/or 500 feet    Time 8    Period Weeks    Status Partially Met      PT LONG TERM GOAL #4   Title demonstrate 4+/5 bil LE strength to improve safety with stepping down off the curb    Time 8    Period Weeks    Status On-going      PT LONG TERM GOAL #5   Title 5x sit to stand test (no UE use) in 14.25 sec    Status On-going      PT LONG TERM GOAL #6   Title No loss of balance with 180 turns 3/3 trials    Time 8    Period Weeks    Status On-going                   Plan - 02/18/21 1240     Clinical Impression Statement Pt continues to work on gait, balance and change of directions at home and in the clinic to improve safety at home and in the community.  Pt remains challenged with turning and change of direction although this is improving.  Pt did well with CGA for safety and no physical assist need to stabilize with turns or with walking on sidewalk and ramp to his car.  Pt demonstrated good control with turning 90 and 180 degrees while holding 5# weight today.  Slow progress with PT due to complex medical history.  Pt will continue to benefit from skilled PT to address balance, strength and endurance.  3 more visits probable.    PT Frequency 2x / week    PT Duration 8 weeks    PT Treatment/Interventions ADLs/Self Care Home Management;Gait training;Stair training;Functional mobility training;Therapeutic  activities;Therapeutic exercise;Balance training;Neuromuscular re-education;Patient/family education;Manual techniques;Passive range of motion    PT Next Visit Plan work on turns 180 degrees; holding 5# Kettlebell with 90 degree turns especially left turns;   balance and weight shifting. 3 more visit probable.    PT Home Exercise Plan Access Code: [facility]    Consulted and Agree with Plan of Care Patient             Patient will benefit from skilled therapeutic intervention in order to improve the following deficits and impairments:  Abnormal gait, Cardiopulmonary status limiting activity, Decreased activity tolerance, Decreased strength, Postural dysfunction, Decreased range of motion, Hypomobility, Difficulty walking, Decreased mobility, Decreased balance, Decreased endurance  Visit Diagnosis: Other abnormalities of gait and mobility  Muscle weakness (generalized)     Problem List Patient Active Problem List   Diagnosis Date Noted   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 07/29/2020  Dependence on renal dialysis ([place]) 11/13/2019   Coagulation defect, unspecified ([place]) 05/11/2019   Encounter for immunization 04/25/2019   Thrombocytopenia ([place]) 04/13/2019   Hypokalemia 04/12/2019   Anaphylactic shock, unspecified, initial encounter 04/10/2019   Anemia in chronic kidney disease 85/46/2703   Complication of vascular dialysis catheter 04/10/2019   Iron deficiency anemia, unspecified 04/10/2019   Secondary hyperparathyroidism of renal origin ([place]) 04/10/2019   Hyperkalemia, diminished renal excretion 04/01/2019   Hyperlipidemia associated with type 2 diabetes mellitus ([place]) 04/13/2018   S/P cholecystectomy 10/13/2017   Diarrhea 05/11/2017   Ruptured appendicitis 03/26/2017   Marital stress 02/18/2017   Trigger finger 06/10/2016   BPH associated with nocturia 04/27/2016   Diabetic peripheral neuropathy (HCC)    COPD (chronic obstructive pulmonary disease)  ([place])    Bell's palsy    Gout 50/03/3817   Chronic systolic CHF (congestive heart failure) ([place]) 04/08/2015   Diverticulitis 04/08/2015   DOE (dyspnea on exertion), due to significant CAD 03/28/2015   ESRD on hemodialysis ([place]) 03/04/2015   CKD (chronic kidney disease) stage 4, GFR 15-29 ml/min (HCC) 03/04/2015   CAD (coronary artery disease) 09/13/2014   GERD (gastroesophageal reflux disease) 08/07/2012   Major depression, recurrent, full remission ([place]) 08/05/2012   Sleep apnea 08/05/2012   Controlled type 2 diabetes mellitus with chronic kidney disease on chronic dialysis ([place]) 08/05/2012   Hypertension associated with diabetes ([place]) 08/05/2012   Diabetes type 2, uncontrolled ([place]) 08/05/2012   Ischemic cardiomyopathy 11/03/2011   Atrioventricular block, complete [facility])    Obesity    Pacemaker     Sigurd Sos, PT 02/18/21 1:13 PM   [facility] [address] [place], [place], [address] Phone: [phone]   Fax:  [phone]  Name: Rodney Farley MRN: [id number] Date of Birth: [birth year]

## 2021-02-20 ENCOUNTER — Ambulatory Visit: Payer: PPO

## 2021-02-20 ENCOUNTER — Other Ambulatory Visit: Payer: Self-pay

## 2021-02-20 DIAGNOSIS — M6281 Muscle weakness (generalized): Secondary | ICD-10-CM | POA: Diagnosis not present

## 2021-02-20 DIAGNOSIS — R2689 Other abnormalities of gait and mobility: Secondary | ICD-10-CM

## 2021-02-20 NOTE — Therapy (Signed)
[facility] [address] [place], [place], [address] Phone: [phone]   Fax:  [phone]  Physical Therapy Treatment  Patient Details  Name: Rodney Farley MRN: [id number] Date of Birth: [birth year] Referring Provider (PT): Hardie Pulley, MD   Encounter Date: 02/20/2021   PT End of Session - 02/20/21 1138     Visit Number 18    Date for PT Re-Evaluation 02/27/21    Authorization Type [employer]    Progress Note Due on Visit 20    PT Start Time 1101    PT Stop Time 1140    PT Time Calculation (min) 39 min    Activity Tolerance Patient tolerated treatment well    Behavior During Therapy [facility] for tasks assessed/performed             Past Medical History:  Diagnosis Date   AICD (automatic cardioverter/defibrillator) present    AKI (acute kidney injury) ([place]) 03/27/2017   Anemia    Anginal pain ([place])    Anxiety    Arm pain 05/08/2015   LEFT ARM   Arthritis    Atrioventricular block, complete ([place])    a. 2010 s/p pacemaker.   Bell's palsy    left side. after shingles episode   Bilateral renal cysts 07/23/2017   Simple and hemorrhagic noted on CT ab/pelvis    BPH associated with nocturia    Cardiomegaly    Chronic systolic CHF (congestive heart failure) ([place])    EF normalized by Echo 2019   CKD (chronic kidney disease), stage IV (HCC)    COPD (chronic obstructive pulmonary disease) ([place])    Severe   Coronary artery disease    a. s/p MI in 1994/1995 while in [place] s/p questionable PCI. 03/2015: progression of disease, for staged PCI.   Depression    Diabetic peripheral neuropathy (HCC)    Diarrhea    Diverticulitis    DOE (dyspnea on exertion) 03/28/2015   Full dentures    Gallstones    GERD (gastroesophageal reflux disease)    Gout    Hard of hearing    B/L   Heart murmur    History of chronic pancreatitis 07/23/2017   noted on CT abd/pelvis   History of shingles     Hypercholesterolemia    Hypertension    Ischemic cardiomyopathy    MI (myocardial infarction) ([place]) 1994; 1995   Neuropathy    IN LOWER EXTREMITIES   Nosebleed 10/06/2017   for 2 months most recent 10/06/2017   Obesity    Pacemaker    medtronic>>> MDT ICD 09/23/15   Ruptured appendicitis    Sleep apnea    "sleeps w/humidifyer when he panics and gets short of breath" (04/08/2015)   TIA (transient ischemic attack) X 3   Trigger middle finger of left hand    Type II diabetes mellitus ([place])    Wears glasses    Wears hearing aid     Past Surgical History:  Procedure Laterality Date   APPENDECTOMY     AV FISTULA PLACEMENT Right 02/07/2019   Procedure: ARTERIOVENOUS (AV) FISTULA CREATION RIGHT UPPER ARM;  Surgeon: Waynetta Sandy, MD;  Location: [facility];  Service: Vascular;  Laterality: Right;   CARDIAC CATHETERIZATION N/A 03/29/2015   Procedure: Right/Left Heart Cath and Coronary Angiography;  Surgeon: Cree M [name], MD;  Location: [facility] CV LAB;  Service: Cardiovascular;  Laterality: N/A;   [place]   "after my MI; put me on  heart RX after cath"   CARDIAC CATHETERIZATION N/A 04/09/2015   Procedure: Coronary Stent Intervention;  Surgeon: Samaad M [name], MD;  Location: [facility] CV LAB;  Service: Cardiovascular;  Laterality: N/A;   CATARACT EXTRACTION W/ INTRAOCULAR LENS  IMPLANT, BILATERAL     CHOLECYSTECTOMY N/A 10/13/2017   Procedure: LAPAROSCOPIC CHOLECYSTECTOMY WITH LYSIS OF ADHESIONS;  Surgeon: Ileana Roup, MD;  [facility];  Service: General;  Laterality: N/A;   COLONOSCOPY     DENTAL SURGERY     EP IMPLANTABLE DEVICE N/A 09/23/2015   MDT CRT-D, Dr. Caryl Comes   HIATAL HERNIA REPAIR  1977   ILEOCECETOMY N/A 03/27/2017   Procedure: ILEOCECECTOMY;  Surgeon: Ileana Roup, MD;  Location: [facility];  Service: General;  Laterality: N/A;   INSERT / REPLACE / REMOVE PACEMAKER  07/2008   Complete heart block status post DDD with good function    IR FLUORO GUIDE CV LINE RIGHT  04/03/2019   IR US GUIDE [place] RIGHT  04/03/2019   LAPAROTOMY N/A 03/27/2017   Procedure: EXPLORATORY LAPAROTOMY;  Surgeon: Ileana Roup, MD;  Location: [facility];  Service: General;  Laterality: N/A;   TONSILLECTOMY     UPPER GI ENDOSCOPY      There were no vitals filed for this visit.   Subjective Assessment - 02/20/21 1112     Subjective I slept much better and I'm feeling good today.    Patient Stated Goals improve gait, stability, improve endurance for walking    Currently in Pain? No/denies                               [facility] Adult PT Treatment/Exercise - 02/20/21 0001       Neuro Re-ed    Neuro Re-ed Details  regular and staggered stance with weight shift side to side and front to back without UE support- standing on balance pad      Lumbar Exercises: Standing   Other Standing Lumbar Exercises 90 degrees turns holding 5# weight in 1 hand; 180 degree turn 2x      Knee/Hip Exercises: Aerobic   Nustep Level 3x 10 min-PT present to discuss progress      Knee/Hip Exercises: Standing   Other Standing Knee Exercises step taps on to foam pad 2x10 bil each with and without UE support      Knee/Hip Exercises: Seated   Other Seated Knee/Hip Exercises hip flexion with 5# kettlebell on knee 10x up and over orange flat cone    Other Seated Knee/Hip Exercises 10# chair sit ups 20x                 Balance Exercises - 02/20/21 0001       Balance Exercises: Standing   Step Over Hurdles / Cones sidestepping with min UE support over hurdle x5 each                 PT Short Term Goals - 01/02/21 1354       PT SHORT TERM GOAL #1   Title be independent in initial HEP    Status Achieved      PT SHORT TERM GOAL #2   Title perform TUG in < or = to 27 seconds to improve mobility and improve balance    Status Achieved      PT SHORT TERM GOAL #3   Title improve LE strength to perform sit to stand with  moderate UE support and maintain stability upon standing    Status Achieved               PT Long Term Goals - 01/16/21 1350       PT LONG TERM GOAL #1   Title be independent in advanced HEP    Time 8    Period Weeks    Status On-going      PT LONG TERM GOAL #2   Title perform TUG in < or = to 14 seconds to improve mobility and reduce falls risk    Status Achieved      PT LONG TERM GOAL #3   Title improve LE strength to walk for 6 minutes and/or 500 feet    Time 8    Period Weeks    Status Partially Met      PT LONG TERM GOAL #4   Title demonstrate 4+/5 bil LE strength to improve safety with stepping down off the curb    Time 8    Period Weeks    Status On-going      PT LONG TERM GOAL #5   Title 5x sit to stand test (no UE use) in 14.25 sec    Status On-going      PT LONG TERM GOAL #6   Title No loss of balance with 180 turns 3/3 trials    Time 8    Period Weeks    Status On-going                   Plan - 02/20/21 1136     Clinical Impression Statement Pt continues to work on gait, balance and change of directions at home and in the clinic to improve safety at home and in the community.  Pt remains challenged with turning and change of direction although this is improving.  Pt did well with CGA for safety and no physical assist need to stabilize with turns today.  Pt performed weight shifting on balance pad without UE support and demonstrated stability.  Slow progress with PT due to complex medical history.  Pt will continue to benefit from skilled PT to address balance, strength and endurance.  2 more visits probable.    PT Frequency 2x / week    PT Duration 8 weeks    PT Treatment/Interventions ADLs/Self Care Home Management;Gait training;Stair training;Functional mobility training;Therapeutic activities;Therapeutic exercise;Balance training;Neuromuscular re-education;Patient/family education;Manual techniques;Passive range of motion    PT Next Visit  Plan work on turns 180 degrees; holding 5# Kettlebell with 90 degree turns especially left turns;   balance and weight shifting. 2 more visit probable.    PT Home Exercise Plan Access Code: [facility]    Consulted and Agree with Plan of Care Patient             Patient will benefit from skilled therapeutic intervention in order to improve the following deficits and impairments:  Abnormal gait, Cardiopulmonary status limiting activity, Decreased activity tolerance, Decreased strength, Postural dysfunction, Decreased range of motion, Hypomobility, Difficulty walking, Decreased mobility, Decreased balance, Decreased endurance  Visit Diagnosis: Other abnormalities of gait and mobility  Muscle weakness (generalized)     Problem List Patient Active Problem List   Diagnosis Date Noted   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 07/29/2020   Dependence on renal dialysis ([place]) 11/13/2019   Coagulation defect, unspecified ([place]) 05/11/2019   Encounter for immunization 04/25/2019   Thrombocytopenia ([place]) 04/13/2019   Hypokalemia 04/12/2019   Anaphylactic shock, unspecified,  initial encounter 04/10/2019   Anemia in chronic kidney disease 88/05/314   Complication of vascular dialysis catheter 04/10/2019   Iron deficiency anemia, unspecified 04/10/2019   Secondary hyperparathyroidism of renal origin ([place]) 04/10/2019   Hyperkalemia, diminished renal excretion 04/01/2019   Hyperlipidemia associated with type 2 diabetes mellitus ([place]) 04/13/2018   S/P cholecystectomy 10/13/2017   Diarrhea 05/11/2017   Ruptured appendicitis 03/26/2017   Marital stress 02/18/2017   Trigger finger 06/10/2016   BPH associated with nocturia 04/27/2016   Diabetic peripheral neuropathy (HCC)    COPD (chronic obstructive pulmonary disease) ([place])    Bell's palsy    Gout 94/58/5929   Chronic systolic CHF (congestive heart failure) ([place]) 04/08/2015   Diverticulitis 04/08/2015   DOE (dyspnea on  exertion), due to significant CAD 03/28/2015   ESRD on hemodialysis ([place]) 03/04/2015   CKD (chronic kidney disease) stage 4, GFR 15-29 ml/min (HCC) 03/04/2015   CAD (coronary artery disease) 09/13/2014   GERD (gastroesophageal reflux disease) 08/07/2012   Major depression, recurrent, full remission ([place]) 08/05/2012   Sleep apnea 08/05/2012   Controlled type 2 diabetes mellitus with chronic kidney disease on chronic dialysis ([place]) 08/05/2012   Hypertension associated with diabetes ([place]) 08/05/2012   Diabetes type 2, uncontrolled ([place]) 08/05/2012   Ischemic cardiomyopathy 11/03/2011   Atrioventricular block, complete [facility])    Obesity    Pacemaker     Sigurd Sos, PT 02/20/21 11:45 AM   [facility] [address] [place], [place], [address] Phone: [phone]   Fax:  [phone]  Name: Demontae Antunes MRN: [id number] Date of Birth: 1931/10/17

## 2021-02-24 DIAGNOSIS — N186 End stage renal disease: Secondary | ICD-10-CM | POA: Diagnosis not present

## 2021-02-24 DIAGNOSIS — D631 Anemia in chronic kidney disease: Secondary | ICD-10-CM | POA: Diagnosis not present

## 2021-02-24 DIAGNOSIS — Z992 Dependence on renal dialysis: Secondary | ICD-10-CM | POA: Diagnosis not present

## 2021-02-24 DIAGNOSIS — E876 Hypokalemia: Secondary | ICD-10-CM | POA: Diagnosis not present

## 2021-02-24 DIAGNOSIS — D689 Coagulation defect, unspecified: Secondary | ICD-10-CM | POA: Diagnosis not present

## 2021-02-24 DIAGNOSIS — N2581 Secondary hyperparathyroidism of renal origin: Secondary | ICD-10-CM | POA: Diagnosis not present

## 2021-02-25 ENCOUNTER — Ambulatory Visit: Payer: PPO | Admitting: Physical Therapy

## 2021-02-25 ENCOUNTER — Other Ambulatory Visit: Payer: Self-pay

## 2021-02-25 ENCOUNTER — Ambulatory Visit: Payer: PPO | Admitting: Podiatry

## 2021-02-25 DIAGNOSIS — R2689 Other abnormalities of gait and mobility: Secondary | ICD-10-CM

## 2021-02-25 DIAGNOSIS — L97412 Non-pressure chronic ulcer of right heel and midfoot with fat layer exposed: Secondary | ICD-10-CM | POA: Diagnosis not present

## 2021-02-25 DIAGNOSIS — E11621 Type 2 diabetes mellitus with foot ulcer: Secondary | ICD-10-CM

## 2021-02-25 DIAGNOSIS — M6281 Muscle weakness (generalized): Secondary | ICD-10-CM

## 2021-02-25 NOTE — Progress Notes (Signed)
  Subjective:  Patient ID: Rodney Farley, male    DOB: [birth year],  MRN: [id number]  Chief Complaint  Patient presents with   Diabetic Ulcer    Diabetic ulcer follow up. Pt states he has no pain while wearing his new diabetic shoes but once he is no longer wearing them or walking in his socks them pain get worse.    87 y.o. male presents for wound care.   Objective:  Physical Exam: Wound Location: right 1st met Wound Measurement: pinpoint Wound Base: granular Peri-wound: Calloused Exudate: None: wound tissue dry wound without warmth, erythema, signs of acute infection Assessment:   1. Diabetic ulcer of right midfoot associated with type 2 diabetes mellitus, with fat layer exposed ([place])    Plan:  Patient was evaluated and treated and all questions answered.  Ulcer Right 1st metatarsal -Offload ulcer with DM shoe and insert. Very pleased with shoes. Advised he is not to walk barefoot and must have shoes at all times. -Gently debrided wound, almost fully healed. -Will plan for every 9 weeks for at risk foot care.  No follow-ups on file.

## 2021-02-25 NOTE — Therapy (Signed)
[facility] [address] [place], [place], [address] Phone: [phone]   Fax:  [phone]  Physical Therapy Treatment  Patient Details  Name: Rodney Farley MRN: [id number] Date of Birth: 04-04-1932 Referring Provider (PT): Hardie Pulley, MD   Encounter Date: 02/25/2021   PT End of Session - 02/25/21 1320     Visit Number 19    Date for PT Re-Evaluation 02/27/21    Authorization Type [employer]    Progress Note Due on Visit 20    PT Start Time 1231    PT Stop Time 1314    PT Time Calculation (min) 43 min    Activity Tolerance Patient tolerated treatment well    Behavior During Therapy [facility] for tasks assessed/performed             Past Medical History:  Diagnosis Date   AICD (automatic cardioverter/defibrillator) present    AKI (acute kidney injury) ([place]) 03/27/2017   Anemia    Anginal pain ([place])    Anxiety    Arm pain 05/08/2015   LEFT ARM   Arthritis    Atrioventricular block, complete ([place])    a. 2010 s/p pacemaker.   Bell's palsy    left side. after shingles episode   Bilateral renal cysts 07/23/2017   Simple and hemorrhagic noted on CT ab/pelvis    BPH associated with nocturia    Cardiomegaly    Chronic systolic CHF (congestive heart failure) ([place])    EF normalized by Echo 2019   CKD (chronic kidney disease), stage IV (HCC)    COPD (chronic obstructive pulmonary disease) ([place])    Severe   Coronary artery disease    a. s/p MI in 1994/1995 while in [place] s/p questionable PCI. 03/2015: progression of disease, for staged PCI.   Depression    Diabetic peripheral neuropathy (HCC)    Diarrhea    Diverticulitis    DOE (dyspnea on exertion) 03/28/2015   Full dentures    Gallstones    GERD (gastroesophageal reflux disease)    Gout    Hard of hearing    B/L   Heart murmur    History of chronic pancreatitis 07/23/2017   noted on CT abd/pelvis   History of shingles     Hypercholesterolemia    Hypertension    Ischemic cardiomyopathy    MI (myocardial infarction) ([place]) 1994; 1995   Neuropathy    IN LOWER EXTREMITIES   Nosebleed 10/06/2017   for 2 months most recent 10/06/2017   Obesity    Pacemaker    medtronic>>> MDT ICD 09/23/15   Ruptured appendicitis    Sleep apnea    "sleeps w/humidifyer when he panics and gets short of breath" (04/08/2015)   TIA (transient ischemic attack) X 3   Trigger middle finger of left hand    Type II diabetes mellitus ([place])    Wears glasses    Wears hearing aid     Past Surgical History:  Procedure Laterality Date   APPENDECTOMY     AV FISTULA PLACEMENT Right 02/07/2019   Procedure: ARTERIOVENOUS (AV) FISTULA CREATION RIGHT UPPER ARM;  Surgeon: Waynetta Sandy, MD;  Location: [place];  Service: Vascular;  Laterality: Right;   CARDIAC CATHETERIZATION N/A 03/29/2015   Procedure: Right/Left Heart Cath and Coronary Angiography;  Surgeon: Ulus M [name], MD;  Location: [place] [facility];  Service: Cardiovascular;  Laterality: N/A;   [place]   "after my MI; put me on  heart RX after cath"   CARDIAC CATHETERIZATION N/A 04/09/2015   Procedure: Coronary Stent Intervention;  Surgeon: Erinn M [name], MD;  Location: [place] [facility];  Service: Cardiovascular;  Laterality: N/A;   CATARACT EXTRACTION W/ INTRAOCULAR LENS  IMPLANT, BILATERAL     CHOLECYSTECTOMY N/A 10/13/2017   Procedure: LAPAROSCOPIC CHOLECYSTECTOMY WITH LYSIS OF ADHESIONS;  Surgeon: Ileana Roup, MD;  [facility];  Service: General;  Laterality: N/A;   COLONOSCOPY     DENTAL SURGERY     EP IMPLANTABLE DEVICE N/A 09/23/2015   MDT CRT-D, Dr. Caryl Comes   HIATAL HERNIA REPAIR  1977   ILEOCECETOMY N/A 03/27/2017   Procedure: ILEOCECECTOMY;  Surgeon: Ileana Roup, MD;  Location: [place];  Service: General;  Laterality: N/A;   INSERT / REPLACE / REMOVE PACEMAKER  07/2008   Complete heart block status post DDD with good function    IR FLUORO GUIDE CV LINE RIGHT  04/03/2019   IR US GUIDE [place] RIGHT  04/03/2019   LAPAROTOMY N/A 03/27/2017   Procedure: EXPLORATORY LAPAROTOMY;  Surgeon: Ileana Roup, MD;  Location: [place];  Service: General;  Laterality: N/A;   TONSILLECTOMY     UPPER GI ENDOSCOPY      There were no vitals filed for this visit.   Subjective Assessment - 02/25/21 1234     Subjective Continues to have difficulty with balance but overall feels that today is a good day.    Pertinent History COPD, CHF, CAD, pacemaker, dialysis 3x/wk.  Fistula in right arm; Pt is hard of hearing, peripheral neuropathy    Limitations Standing;Walking    How long can you walk comfortably? using walker/Rollator: limited to < 5 minutes    Patient Stated Goals improve gait, stability, improve endurance for walking    Currently in Pain? No/denies                               [facility] Adult PT Treatment/Exercise - 02/25/21 0001       Neuro Re-ed    Neuro Re-ed Details  regular and staggered stance with weight shift side to side and front to back without UE support- standing on balance pad - x30s each      Lumbar Exercises: Standing   Other Standing Lumbar Exercises 90 degrees turns holding 5# weight in 1 hand Lt/Rt; 180 degree turn Lt/Rt x2      Knee/Hip Exercises: Aerobic   Nustep Level 3x 10 min-PT present to discuss progress      Knee/Hip Exercises: Standing   Other Standing Knee Exercises step taps on to foam pad 2x10 bil each without UE support      Knee/Hip Exercises: Seated   Other Seated Knee/Hip Exercises hip flexion with 5# kettlebell on knee 15 up and over orange flat cone    Other Seated Knee/Hip Exercises 10# chair sit ups 20x                      PT Short Term Goals - 01/02/21 1354       PT SHORT TERM GOAL #1   Title be independent in initial HEP    Status Achieved      PT SHORT TERM GOAL #2   Title perform TUG in < or = to 27 seconds to improve mobility  and improve balance    Status Achieved      PT SHORT TERM GOAL #3  Title improve LE strength to perform sit to stand with moderate UE support and maintain stability upon standing    Status Achieved               PT Long Term Goals - 02/25/21 1318       PT LONG TERM GOAL #1   Title be independent in advanced HEP    Time 8    Period Weeks    Status On-going      PT LONG TERM GOAL #2   Title perform TUG in < or = to 14 seconds to improve mobility and reduce falls risk    Time 8    Period Weeks    Status Achieved      PT LONG TERM GOAL #3   Title improve LE strength to walk for 6 minutes and/or 500 feet    Time 8    Period Weeks    Status Partially Met      PT LONG TERM GOAL #6   Title No loss of balance with 180 turns 3/3 trials    Time 8    Period Weeks    Status Partially Met   2/2 trials to Lt and Rt                  Plan - 02/25/21 1315     Clinical Impression Statement Patient requiring CGA intermittently during step tap exercise but patient able to maintain standing approx. 80% of the time during activity. Verbal cuing for proper weight shift during static balance activity. Patient unsure if ready for D/C. Will further assess readiness next session.    Personal Factors and Comorbidities Comorbidity 3+;Time since onset of injury/illness/exacerbation;Age    Comorbidities dialysis 3x/wk, pacemaker, COPD, CHF, falls    Examination-Activity Limitations Bed Mobility;Carry;Locomotion Level;Squat;Stairs;Stand;Transfers    Examination-Participation Restrictions Community Activity;Driving;Meal Prep;Shop    Rehab Potential Good    PT Frequency 2x / week    PT Duration 8 weeks    PT Treatment/Interventions ADLs/Self Care Home Management;Gait training;Stair training;Functional mobility training;Therapeutic activities;Therapeutic exercise;Balance training;Neuromuscular re-education;Patient/family education;Manual techniques;Passive range of motion    PT Next Visit  Plan EOB next session    PT Home Exercise Plan Access Code: [place] and Agree with Plan of Care Patient             Patient will benefit from skilled therapeutic intervention in order to improve the following deficits and impairments:  Abnormal gait, Cardiopulmonary status limiting activity, Decreased activity tolerance, Decreased strength, Postural dysfunction, Decreased range of motion, Hypomobility, Difficulty walking, Decreased mobility, Decreased balance, Decreased endurance  Visit Diagnosis: Other abnormalities of gait and mobility  Muscle weakness (generalized)     Problem List Patient Active Problem List   Diagnosis Date Noted   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 07/29/2020   Dependence on renal dialysis ([place]) 11/13/2019   Coagulation defect, unspecified ([place]) 05/11/2019   Encounter for immunization 04/25/2019   Thrombocytopenia ([place]) 04/13/2019   Hypokalemia 04/12/2019   Anaphylactic shock, unspecified, initial encounter 04/10/2019   Anemia in chronic kidney disease 37/85/8850   Complication of vascular dialysis catheter 04/10/2019   Iron deficiency anemia, unspecified 04/10/2019   Secondary hyperparathyroidism of renal origin ([place]) 04/10/2019   Hyperkalemia, diminished renal excretion 04/01/2019   Hyperlipidemia associated with type 2 diabetes mellitus ([place]) 04/13/2018   S/P cholecystectomy 10/13/2017   Diarrhea 05/11/2017   Ruptured appendicitis 03/26/2017   Marital stress 02/18/2017   Trigger finger 06/10/2016  BPH associated with nocturia 04/27/2016   Diabetic peripheral neuropathy (HCC)    COPD (chronic obstructive pulmonary disease) (HCC)    Bell's palsy    Gout 35/68/6168   Chronic systolic CHF (congestive heart failure) ([place]) 04/08/2015   Diverticulitis 04/08/2015   DOE (dyspnea on exertion), due to significant CAD 03/28/2015   ESRD on hemodialysis ([place]) 03/04/2015   CKD (chronic kidney disease) stage 4, GFR  15-29 ml/min (HCC) 03/04/2015   CAD (coronary artery disease) 09/13/2014   GERD (gastroesophageal reflux disease) 08/07/2012   Major depression, recurrent, full remission ([place]) 08/05/2012   Sleep apnea 08/05/2012   Controlled type 2 diabetes mellitus with chronic kidney disease on chronic dialysis ([place]) 08/05/2012   Hypertension associated with diabetes ([place]) 08/05/2012   Diabetes type 2, uncontrolled ([place]) 08/05/2012   Ischemic cardiomyopathy 11/03/2011   Atrioventricular block, complete [facility])    Obesity    Pacemaker     Everardo All PT, DPT 02/25/21 1:21 PM   [facility] [address], [place], [place], [address] Phone: [phone]   Fax:  [phone]  Name: Rodney Farley MRN: [id number] Date of Birth: 02/02/1932

## 2021-02-27 ENCOUNTER — Ambulatory Visit: Payer: PPO

## 2021-02-27 ENCOUNTER — Other Ambulatory Visit: Payer: Self-pay

## 2021-02-27 DIAGNOSIS — R2689 Other abnormalities of gait and mobility: Secondary | ICD-10-CM

## 2021-02-27 DIAGNOSIS — M6281 Muscle weakness (generalized): Secondary | ICD-10-CM

## 2021-02-27 NOTE — Therapy (Signed)
[facility] [address] [place], [place], [address] Phone: [phone]   Fax:  [phone]  Physical Therapy Treatment  Patient Details  Name: Rodney Farley MRN: [id number] Date of Birth: [birth year] Referring Provider (PT): Hardie Pulley, MD   Encounter Date: 02/27/2021 Progress Note Reporting Period 01/21/21 to 02/27/21  See note below for Objective Data and Assessment of Progress/Goals.      PT End of Session - 02/27/21 1138     Visit Number 20    Date for PT Re-Evaluation 02/27/21    Authorization Type [employer]    Progress Note Due on Visit 20    PT Start Time 1053    PT Stop Time 1137    PT Time Calculation (min) 44 min    Activity Tolerance Patient tolerated treatment well    Behavior During Therapy WFL for tasks assessed/performed             Past Medical History:  Diagnosis Date   AICD (automatic cardioverter/defibrillator) present    AKI (acute kidney injury) ([place]) 03/27/2017   Anemia    Anginal pain (HCC)    Anxiety    Arm pain 05/08/2015   LEFT ARM   Arthritis    Atrioventricular block, complete ([place])    a. 2010 s/p pacemaker.   Bell's palsy    left side. after shingles episode   Bilateral renal cysts 07/23/2017   Simple and hemorrhagic noted on CT ab/pelvis    BPH associated with nocturia    Cardiomegaly    Chronic systolic CHF (congestive heart failure) ([place])    EF normalized by Echo 2019   CKD (chronic kidney disease), stage IV (HCC)    COPD (chronic obstructive pulmonary disease) ([place])    Severe   Coronary artery disease    a. s/p MI in 1994/1995 while in [place] s/p questionable PCI. 03/2015: progression of disease, for staged PCI.   Depression    Diabetic peripheral neuropathy (HCC)    Diarrhea    Diverticulitis    DOE (dyspnea on exertion) 03/28/2015   Full dentures    Gallstones    GERD (gastroesophageal reflux disease)    Gout    Hard of hearing     B/L   Heart murmur    History of chronic pancreatitis 07/23/2017   noted on CT abd/pelvis   History of shingles    Hypercholesterolemia    Hypertension    Ischemic cardiomyopathy    MI (myocardial infarction) ([employer]) 1994; 1995   Neuropathy    IN LOWER EXTREMITIES   Nosebleed 10/06/2017   for 2 months most recent 10/06/2017   Obesity    Pacemaker    medtronic>>> MDT ICD 09/23/15   Ruptured appendicitis    Sleep apnea    "sleeps w/humidifyer when he panics and gets short of breath" (04/08/2015)   TIA (transient ischemic attack) X 3   Trigger middle finger of left hand    Type II diabetes mellitus ([place])    Wears glasses    Wears hearing aid     Past Surgical History:  Procedure Laterality Date   APPENDECTOMY     AV FISTULA PLACEMENT Right 02/07/2019   Procedure: ARTERIOVENOUS (AV) FISTULA CREATION RIGHT UPPER ARM;  Surgeon: Waynetta Sandy, MD;  Location: [facility];  Service: Vascular;  Laterality: Right;   CARDIAC CATHETERIZATION N/A 03/29/2015   Procedure: Right/Left Heart Cath and Coronary Angiography;  Surgeon: Deo M [name], MD;  Location: [place] [facility];  Service: Cardiovascular;  Laterality: N/A;   [place]   "after my MI; put me on heart RX after cath"   CARDIAC CATHETERIZATION N/A 04/09/2015   Procedure: Coronary Stent Intervention;  Surgeon: Finch M [name], MD;  Location: [place] [facility];  Service: Cardiovascular;  Laterality: N/A;   CATARACT EXTRACTION W/ INTRAOCULAR LENS  IMPLANT, BILATERAL     CHOLECYSTECTOMY N/A 10/13/2017   Procedure: LAPAROSCOPIC CHOLECYSTECTOMY WITH LYSIS OF ADHESIONS;  Surgeon: Ileana Roup, MD;  [facility];  Service: General;  Laterality: N/A;   COLONOSCOPY     DENTAL SURGERY     EP IMPLANTABLE DEVICE N/A 09/23/2015   MDT CRT-D, Dr. Caryl Comes   HIATAL HERNIA REPAIR  1977   ILEOCECETOMY N/A 03/27/2017   Procedure: ILEOCECECTOMY;  Surgeon: Ileana Roup, MD;  Location: [place];  Service: General;   Laterality: N/A;   INSERT / REPLACE / REMOVE PACEMAKER  07/2008   Complete heart block status post DDD with good function   IR FLUORO GUIDE CV LINE RIGHT  04/03/2019   IR US GUIDE [place] RIGHT  04/03/2019   LAPAROTOMY N/A 03/27/2017   Procedure: EXPLORATORY LAPAROTOMY;  Surgeon: Ileana Roup, MD;  Location: [place];  Service: General;  Laterality: N/A;   TONSILLECTOMY     UPPER GI ENDOSCOPY      There were no vitals filed for this visit.   Subjective Assessment - 02/27/21 1055     Subjective I'm going to work on staying active after I discharge.    Currently in Pain? No/denies                [facility] PT Assessment - 02/27/21 0001       Assessment   Medical Diagnosis gait disturbance, at high risk for injury related to fall    Referring Provider (PT) Hardie Pulley, MD      [place]      Prior Function   Level of [place] device for independence    Vocation Retired      Associate Professor   Overall Cognitive Status Within Functional Limits for tasks assessed      Strength   Overall Strength Comments Lt hip 4+/5, Rt hip 4+/5, Rt knee 5/5, Lt knee 4+/5, ankle DF 4+/5 bil      Ambulation/Gait   Assistive device Rolling walker    Gait Pattern Step-through pattern;Decreased stride length      6 minute walk test results    Endurance additional comments 528 feet in 3 minutes      Standardized Balance Assessment   Five times sit to stand comments  12.18 no UE support      Timed Up and Go Test   TUG Normal TUG    Normal TUG (seconds) 11.26    TUG Comments using walker                           [facility] Adult PT Treatment/Exercise - 02/27/21 0001       Lumbar Exercises: Standing   Other Standing Lumbar Exercises 90 degrees turns holding 5# weight in 1 hand Lt/Rt; 180 degree turn Lt/Rt x2      Knee/Hip Exercises: Aerobic   Nustep  Level 3x 10 min-PT present to discuss progress      Knee/Hip Exercises: Standing  Other Standing Knee Exercises step taps on to foam pad 2x10 bil each without UE support      Knee/Hip Exercises: Seated   Other Seated Knee/Hip Exercises hip flexion with 5# kettlebell on knee 15 up and over orange flat cone    Other Seated Knee/Hip Exercises 10# chair sit ups 20x                      PT Short Term Goals - 01/02/21 1354       PT SHORT TERM GOAL #1   Title be independent in initial HEP    Status Achieved      PT SHORT TERM GOAL #2   Title perform TUG in < or = to 27 seconds to improve mobility and improve balance    Status Achieved      PT SHORT TERM GOAL #3   Title improve LE strength to perform sit to stand with moderate UE support and maintain stability upon standing    Status Achieved               PT Long Term Goals - 02/27/21 1059       PT LONG TERM GOAL #1   Title be independent in advanced HEP    Status Achieved      PT LONG TERM GOAL #2   Title perform TUG in < or = to 14 seconds to improve mobility and reduce falls risk    Status Achieved      PT LONG TERM GOAL #3   Title improve LE strength to walk for 6 minutes and/or 500 feet    Baseline 528 feet in 3 minutes      PT LONG TERM GOAL #4   Title demonstrate 4+/5 bil LE strength to improve safety with stepping down off the curb    Status Achieved      PT LONG TERM GOAL #5   Title 5x sit to stand test (no UE use) in 14.25 sec    Status Achieved      PT LONG TERM GOAL #6   Title No loss of balance with 180 turns 3/3 trials    Status Achieved                   Plan - 02/27/21 1123     Clinical Impression Statement Pt continues to work on gait, balance and change of directions at home and in the clinic to improve safety at home and in the community.  Balance testing is improved since evaluation indicating reduced falls risk.  Pt with improved ability to turn 90 and 180 degrees  without loss of balance and was able to do this x 3 today without loss of balance today.  5x sit to stand without UE support is 12.18 seconds. Pt walked 528 feet in 3 minutes, meeting goal for endurance.  PT reviewed all HEP with pt and advised hip to remain consistent for further gains.  Pt will D/C today and follow-up with MD as needed.    PT Next Visit Plan D/C PT to HEP    PT Home Exercise Plan Access Code: [facility]    Consulted and Agree with Plan of Care Patient             Patient will benefit from skilled therapeutic intervention in order to improve the following deficits and impairments:     Visit Diagnosis: Other abnormalities of gait and mobility  Muscle weakness (generalized)  Problem List Patient Active Problem List   Diagnosis Date Noted   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 07/29/2020   Dependence on renal dialysis ([place]) 11/13/2019   Coagulation defect, unspecified ([place]) 05/11/2019   Encounter for immunization 04/25/2019   Thrombocytopenia ([place]) 04/13/2019   Hypokalemia 04/12/2019   Anaphylactic shock, unspecified, initial encounter 04/10/2019   Anemia in chronic kidney disease 16/04/9603   Complication of vascular dialysis catheter 04/10/2019   Iron deficiency anemia, unspecified 04/10/2019   Secondary hyperparathyroidism of renal origin ([place]) 04/10/2019   Hyperkalemia, diminished renal excretion 04/01/2019   Hyperlipidemia associated with type 2 diabetes mellitus ([place]) 04/13/2018   S/P cholecystectomy 10/13/2017   Diarrhea 05/11/2017   Ruptured appendicitis 03/26/2017   Marital stress 02/18/2017   Trigger finger 06/10/2016   BPH associated with nocturia 04/27/2016   Diabetic peripheral neuropathy (HCC)    COPD (chronic obstructive pulmonary disease) ([place])    Bell's palsy    Gout 54/03/8118   Chronic systolic CHF (congestive heart failure) ([place]) 04/08/2015   Diverticulitis 04/08/2015   DOE (dyspnea on exertion), due to  significant CAD 03/28/2015   ESRD on hemodialysis ([place]) 03/04/2015   CKD (chronic kidney disease) stage 4, GFR 15-29 ml/min (HCC) 03/04/2015   CAD (coronary artery disease) 09/13/2014   GERD (gastroesophageal reflux disease) 08/07/2012   Major depression, recurrent, full remission ([place]) 08/05/2012   Sleep apnea 08/05/2012   Controlled type 2 diabetes mellitus with chronic kidney disease on chronic dialysis ([place]) 08/05/2012   Hypertension associated with diabetes ([place]) 08/05/2012   Diabetes type 2, uncontrolled ([place]) 08/05/2012   Ischemic cardiomyopathy 11/03/2011   Atrioventricular block, complete ([place])    Obesity    Pacemaker    PHYSICAL THERAPY DISCHARGE SUMMARY  Visits from Start of Care: 20  Current functional level related to goals / functional outcomes: See above for current status.     Remaining deficits: Chronic endurance and balance deficits.  Pt with significant improvement since the start of care and will continue with exercises.   Education / Equipment: HEP   Patient agrees to discharge. Patient goals were met. Patient is being discharged due to meeting the stated rehab goals.   Sigurd Sos, PT 02/27/21 11:40 AM   [facility] [address] [place], [place], [address] Phone: [phone]   Fax:  [phone]  Name: Nole Robey MRN: [id number] Date of Birth: [birth year]

## 2021-03-03 DIAGNOSIS — N186 End stage renal disease: Secondary | ICD-10-CM | POA: Diagnosis not present

## 2021-03-03 DIAGNOSIS — Z992 Dependence on renal dialysis: Secondary | ICD-10-CM | POA: Diagnosis not present

## 2021-03-03 DIAGNOSIS — D689 Coagulation defect, unspecified: Secondary | ICD-10-CM | POA: Diagnosis not present

## 2021-03-03 DIAGNOSIS — N2581 Secondary hyperparathyroidism of renal origin: Secondary | ICD-10-CM | POA: Diagnosis not present

## 2021-03-03 DIAGNOSIS — E876 Hypokalemia: Secondary | ICD-10-CM | POA: Diagnosis not present

## 2021-03-03 DIAGNOSIS — D631 Anemia in chronic kidney disease: Secondary | ICD-10-CM | POA: Diagnosis not present

## 2021-03-05 NOTE — Progress Notes (Signed)
Remote ICD transmission.   

## 2021-03-10 DIAGNOSIS — E876 Hypokalemia: Secondary | ICD-10-CM | POA: Diagnosis not present

## 2021-03-10 DIAGNOSIS — N186 End stage renal disease: Secondary | ICD-10-CM | POA: Diagnosis not present

## 2021-03-10 DIAGNOSIS — N2581 Secondary hyperparathyroidism of renal origin: Secondary | ICD-10-CM | POA: Diagnosis not present

## 2021-03-10 DIAGNOSIS — D689 Coagulation defect, unspecified: Secondary | ICD-10-CM | POA: Diagnosis not present

## 2021-03-10 DIAGNOSIS — D631 Anemia in chronic kidney disease: Secondary | ICD-10-CM | POA: Diagnosis not present

## 2021-03-10 DIAGNOSIS — Z992 Dependence on renal dialysis: Secondary | ICD-10-CM | POA: Diagnosis not present

## 2021-03-12 DIAGNOSIS — N186 End stage renal disease: Secondary | ICD-10-CM | POA: Diagnosis not present

## 2021-03-12 DIAGNOSIS — E1129 Type 2 diabetes mellitus with other diabetic kidney complication: Secondary | ICD-10-CM | POA: Diagnosis not present

## 2021-03-12 DIAGNOSIS — Z992 Dependence on renal dialysis: Secondary | ICD-10-CM | POA: Diagnosis not present

## 2021-03-13 ENCOUNTER — Other Ambulatory Visit: Payer: Self-pay | Admitting: Podiatry

## 2021-03-13 ENCOUNTER — Ambulatory Visit (INDEPENDENT_AMBULATORY_CARE_PROVIDER_SITE_OTHER): Payer: PPO

## 2021-03-13 DIAGNOSIS — I442 Atrioventricular block, complete: Secondary | ICD-10-CM

## 2021-03-14 DIAGNOSIS — N186 End stage renal disease: Secondary | ICD-10-CM | POA: Diagnosis not present

## 2021-03-14 DIAGNOSIS — N2581 Secondary hyperparathyroidism of renal origin: Secondary | ICD-10-CM | POA: Diagnosis not present

## 2021-03-14 DIAGNOSIS — D689 Coagulation defect, unspecified: Secondary | ICD-10-CM | POA: Diagnosis not present

## 2021-03-14 DIAGNOSIS — Z992 Dependence on renal dialysis: Secondary | ICD-10-CM | POA: Diagnosis not present

## 2021-03-17 DIAGNOSIS — D689 Coagulation defect, unspecified: Secondary | ICD-10-CM | POA: Diagnosis not present

## 2021-03-17 DIAGNOSIS — D631 Anemia in chronic kidney disease: Secondary | ICD-10-CM | POA: Diagnosis not present

## 2021-03-17 DIAGNOSIS — N2581 Secondary hyperparathyroidism of renal origin: Secondary | ICD-10-CM | POA: Diagnosis not present

## 2021-03-17 DIAGNOSIS — Z992 Dependence on renal dialysis: Secondary | ICD-10-CM | POA: Diagnosis not present

## 2021-03-17 DIAGNOSIS — N186 End stage renal disease: Secondary | ICD-10-CM | POA: Diagnosis not present

## 2021-03-17 DIAGNOSIS — E876 Hypokalemia: Secondary | ICD-10-CM | POA: Diagnosis not present

## 2021-03-17 NOTE — Telephone Encounter (Signed)
Please advise 

## 2021-03-18 LAB — CUP PACEART REMOTE DEVICE CHECK
Battery Remaining Longevity: 5 mo
Battery Voltage: 2.81 V
Brady Statistic AP VP Percent: 85.29 %
Brady Statistic AP VS Percent: 0.02 %
Brady Statistic AS VP Percent: 14.66 %
Brady Statistic AS VS Percent: 0.03 %
Brady Statistic RA Percent Paced: 85.21 %
Brady Statistic RV Percent Paced: 99.82 %
Date Time Interrogation Session: 20220901043724
HighPow Impedance: 71 Ohm
Implantable Lead Implant Date: 20100112
Implantable Lead Implant Date: 20170313
Implantable Lead Implant Date: 20170313
Implantable Lead Location: 753858
Implantable Lead Location: 753859
Implantable Lead Location: 753860
Implantable Lead Model: 4598
Implantable Lead Model: 5076
Implantable Pulse Generator Implant Date: 20170313
Lead Channel Impedance Value: 399 Ohm
Lead Channel Impedance Value: 418 Ohm
Lead Channel Impedance Value: 418 Ohm
Lead Channel Impedance Value: 456 Ohm
Lead Channel Impedance Value: 456 Ohm
Lead Channel Impedance Value: 513 Ohm
Lead Channel Impedance Value: 513 Ohm
Lead Channel Impedance Value: 551 Ohm
Lead Channel Impedance Value: 722 Ohm
Lead Channel Impedance Value: 760 Ohm
Lead Channel Impedance Value: 817 Ohm
Lead Channel Impedance Value: 817 Ohm
Lead Channel Impedance Value: 836 Ohm
Lead Channel Pacing Threshold Amplitude: 0.5 V
Lead Channel Pacing Threshold Amplitude: 1.625 V
Lead Channel Pacing Threshold Amplitude: 1.75 V
Lead Channel Pacing Threshold Pulse Width: 0.4 ms
Lead Channel Pacing Threshold Pulse Width: 0.4 ms
Lead Channel Pacing Threshold Pulse Width: 0.4 ms
Lead Channel Sensing Intrinsic Amplitude: 0.5 mV
Lead Channel Sensing Intrinsic Amplitude: 0.5 mV
Lead Channel Sensing Intrinsic Amplitude: 12.375 mV
Lead Channel Sensing Intrinsic Amplitude: 12.375 mV
Lead Channel Setting Pacing Amplitude: 2.5 V
Lead Channel Setting Pacing Amplitude: 3.5 V
Lead Channel Setting Pacing Amplitude: 3.5 V
Lead Channel Setting Pacing Pulse Width: 0.4 ms
Lead Channel Setting Pacing Pulse Width: 0.4 ms
Lead Channel Setting Sensing Sensitivity: 0.3 mV

## 2021-03-18 MED ORDER — GABAPENTIN 300 MG PO CAPS: ORAL_CAPSULE | ORAL | 0 refills | Status: DC

## 2021-03-24 DIAGNOSIS — N186 End stage renal disease: Secondary | ICD-10-CM | POA: Diagnosis not present

## 2021-03-24 DIAGNOSIS — N2581 Secondary hyperparathyroidism of renal origin: Secondary | ICD-10-CM | POA: Diagnosis not present

## 2021-03-24 DIAGNOSIS — D631 Anemia in chronic kidney disease: Secondary | ICD-10-CM | POA: Diagnosis not present

## 2021-03-24 DIAGNOSIS — D689 Coagulation defect, unspecified: Secondary | ICD-10-CM | POA: Diagnosis not present

## 2021-03-24 DIAGNOSIS — Z992 Dependence on renal dialysis: Secondary | ICD-10-CM | POA: Diagnosis not present

## 2021-03-25 NOTE — Addendum Note (Signed)
Addended by: Douglass Rivers D on: 03/25/2021 01:32 PM   Modules accepted: Level of Service

## 2021-03-25 NOTE — Progress Notes (Signed)
Remote ICD transmission.   

## 2021-03-31 DIAGNOSIS — Z992 Dependence on renal dialysis: Secondary | ICD-10-CM | POA: Diagnosis not present

## 2021-03-31 DIAGNOSIS — D631 Anemia in chronic kidney disease: Secondary | ICD-10-CM | POA: Diagnosis not present

## 2021-03-31 DIAGNOSIS — D689 Coagulation defect, unspecified: Secondary | ICD-10-CM | POA: Diagnosis not present

## 2021-03-31 DIAGNOSIS — N2581 Secondary hyperparathyroidism of renal origin: Secondary | ICD-10-CM | POA: Diagnosis not present

## 2021-03-31 DIAGNOSIS — E876 Hypokalemia: Secondary | ICD-10-CM | POA: Diagnosis not present

## 2021-03-31 DIAGNOSIS — N186 End stage renal disease: Secondary | ICD-10-CM | POA: Diagnosis not present

## 2021-04-07 ENCOUNTER — Encounter: Payer: Self-pay | Admitting: Podiatry

## 2021-04-07 DIAGNOSIS — D631 Anemia in chronic kidney disease: Secondary | ICD-10-CM | POA: Diagnosis not present

## 2021-04-07 DIAGNOSIS — N2581 Secondary hyperparathyroidism of renal origin: Secondary | ICD-10-CM | POA: Diagnosis not present

## 2021-04-07 DIAGNOSIS — N186 End stage renal disease: Secondary | ICD-10-CM | POA: Diagnosis not present

## 2021-04-07 DIAGNOSIS — E876 Hypokalemia: Secondary | ICD-10-CM | POA: Diagnosis not present

## 2021-04-07 DIAGNOSIS — D689 Coagulation defect, unspecified: Secondary | ICD-10-CM | POA: Diagnosis not present

## 2021-04-07 DIAGNOSIS — Z992 Dependence on renal dialysis: Secondary | ICD-10-CM | POA: Diagnosis not present

## 2021-04-11 DIAGNOSIS — N186 End stage renal disease: Secondary | ICD-10-CM | POA: Diagnosis not present

## 2021-04-11 DIAGNOSIS — E1129 Type 2 diabetes mellitus with other diabetic kidney complication: Secondary | ICD-10-CM | POA: Diagnosis not present

## 2021-04-11 DIAGNOSIS — Z992 Dependence on renal dialysis: Secondary | ICD-10-CM | POA: Diagnosis not present

## 2021-04-12 ENCOUNTER — Other Ambulatory Visit: Payer: Self-pay | Admitting: Family Medicine

## 2021-04-12 DIAGNOSIS — F32A Depression, unspecified: Secondary | ICD-10-CM

## 2021-04-14 ENCOUNTER — Ambulatory Visit (INDEPENDENT_AMBULATORY_CARE_PROVIDER_SITE_OTHER): Payer: PPO

## 2021-04-14 DIAGNOSIS — D689 Coagulation defect, unspecified: Secondary | ICD-10-CM | POA: Diagnosis not present

## 2021-04-14 DIAGNOSIS — N186 End stage renal disease: Secondary | ICD-10-CM | POA: Diagnosis not present

## 2021-04-14 DIAGNOSIS — D631 Anemia in chronic kidney disease: Secondary | ICD-10-CM | POA: Diagnosis not present

## 2021-04-14 DIAGNOSIS — E876 Hypokalemia: Secondary | ICD-10-CM | POA: Diagnosis not present

## 2021-04-14 DIAGNOSIS — Z992 Dependence on renal dialysis: Secondary | ICD-10-CM | POA: Diagnosis not present

## 2021-04-14 DIAGNOSIS — I442 Atrioventricular block, complete: Secondary | ICD-10-CM

## 2021-04-14 DIAGNOSIS — N2581 Secondary hyperparathyroidism of renal origin: Secondary | ICD-10-CM | POA: Diagnosis not present

## 2021-04-14 MED ORDER — LANSOPRAZOLE 15 MG PO CPDR: DELAYED_RELEASE_CAPSULE | ORAL | 0 refills | Status: DC

## 2021-04-14 MED ORDER — SERTRALINE HCL 100 MG PO TABS: ORAL_TABLET | ORAL | 0 refills | Status: DC

## 2021-04-15 LAB — CUP PACEART REMOTE DEVICE CHECK
Battery Remaining Longevity: 4 mo
Battery Voltage: 2.79 V
Brady Statistic AP VP Percent: 84.08 %
Brady Statistic AP VS Percent: 0.03 %
Brady Statistic AS VP Percent: 15.83 %
Brady Statistic AS VS Percent: 0.06 %
Brady Statistic RA Percent Paced: 84 %
Brady Statistic RV Percent Paced: 99.76 %
Date Time Interrogation Session: 20221003001603
HighPow Impedance: 65 Ohm
Implantable Lead Implant Date: 20100112
Implantable Lead Implant Date: 20170313
Implantable Lead Implant Date: 20170313
Implantable Lead Location: 753858
Implantable Lead Location: 753859
Implantable Lead Location: 753860
Implantable Lead Model: 4598
Implantable Lead Model: 5076
Implantable Pulse Generator Implant Date: 20170313
Lead Channel Impedance Value: 399 Ohm
Lead Channel Impedance Value: 399 Ohm
Lead Channel Impedance Value: 418 Ohm
Lead Channel Impedance Value: 418 Ohm
Lead Channel Impedance Value: 456 Ohm
Lead Channel Impedance Value: 475 Ohm
Lead Channel Impedance Value: 513 Ohm
Lead Channel Impedance Value: 551 Ohm
Lead Channel Impedance Value: 722 Ohm
Lead Channel Impedance Value: 722 Ohm
Lead Channel Impedance Value: 779 Ohm
Lead Channel Impedance Value: 779 Ohm
Lead Channel Impedance Value: 817 Ohm
Lead Channel Pacing Threshold Amplitude: 0.5 V
Lead Channel Pacing Threshold Amplitude: 1.5 V
Lead Channel Pacing Threshold Amplitude: 1.625 V
Lead Channel Pacing Threshold Pulse Width: 0.4 ms
Lead Channel Pacing Threshold Pulse Width: 0.4 ms
Lead Channel Pacing Threshold Pulse Width: 0.4 ms
Lead Channel Sensing Intrinsic Amplitude: 0.5 mV
Lead Channel Sensing Intrinsic Amplitude: 0.5 mV
Lead Channel Sensing Intrinsic Amplitude: 12.375 mV
Lead Channel Sensing Intrinsic Amplitude: 12.375 mV
Lead Channel Setting Pacing Amplitude: 2.5 V
Lead Channel Setting Pacing Amplitude: 3.25 V
Lead Channel Setting Pacing Amplitude: 3.5 V
Lead Channel Setting Pacing Pulse Width: 0.4 ms
Lead Channel Setting Pacing Pulse Width: 0.4 ms
Lead Channel Setting Sensing Sensitivity: 0.3 mV

## 2021-04-21 DIAGNOSIS — N186 End stage renal disease: Secondary | ICD-10-CM | POA: Diagnosis not present

## 2021-04-21 DIAGNOSIS — D631 Anemia in chronic kidney disease: Secondary | ICD-10-CM | POA: Diagnosis not present

## 2021-04-21 DIAGNOSIS — E1129 Type 2 diabetes mellitus with other diabetic kidney complication: Secondary | ICD-10-CM | POA: Diagnosis not present

## 2021-04-21 DIAGNOSIS — D689 Coagulation defect, unspecified: Secondary | ICD-10-CM | POA: Diagnosis not present

## 2021-04-21 DIAGNOSIS — Z992 Dependence on renal dialysis: Secondary | ICD-10-CM | POA: Diagnosis not present

## 2021-04-21 DIAGNOSIS — N2581 Secondary hyperparathyroidism of renal origin: Secondary | ICD-10-CM | POA: Diagnosis not present

## 2021-04-21 NOTE — Addendum Note (Signed)
Addended by: Douglass Rivers D on: 04/21/2021 04:34 PM   Modules accepted: Level of Service

## 2021-04-21 NOTE — Progress Notes (Signed)
Remote ICD transmission.   

## 2021-04-25 ENCOUNTER — Encounter: Payer: Self-pay | Admitting: Family Medicine

## 2021-04-28 DIAGNOSIS — Z992 Dependence on renal dialysis: Secondary | ICD-10-CM | POA: Diagnosis not present

## 2021-04-28 DIAGNOSIS — D689 Coagulation defect, unspecified: Secondary | ICD-10-CM | POA: Diagnosis not present

## 2021-04-28 DIAGNOSIS — N186 End stage renal disease: Secondary | ICD-10-CM | POA: Diagnosis not present

## 2021-04-28 DIAGNOSIS — E876 Hypokalemia: Secondary | ICD-10-CM | POA: Diagnosis not present

## 2021-04-28 DIAGNOSIS — D631 Anemia in chronic kidney disease: Secondary | ICD-10-CM | POA: Diagnosis not present

## 2021-04-28 DIAGNOSIS — N2581 Secondary hyperparathyroidism of renal origin: Secondary | ICD-10-CM | POA: Diagnosis not present

## 2021-04-28 NOTE — Telephone Encounter (Signed)
See below

## 2021-04-29 ENCOUNTER — Other Ambulatory Visit: Payer: Self-pay | Admitting: Podiatry

## 2021-04-29 ENCOUNTER — Ambulatory Visit: Payer: PPO | Admitting: Podiatry

## 2021-04-30 ENCOUNTER — Other Ambulatory Visit: Payer: Self-pay | Admitting: Podiatry

## 2021-05-05 DIAGNOSIS — D631 Anemia in chronic kidney disease: Secondary | ICD-10-CM | POA: Diagnosis not present

## 2021-05-05 DIAGNOSIS — N2581 Secondary hyperparathyroidism of renal origin: Secondary | ICD-10-CM | POA: Diagnosis not present

## 2021-05-05 DIAGNOSIS — Z992 Dependence on renal dialysis: Secondary | ICD-10-CM | POA: Diagnosis not present

## 2021-05-05 DIAGNOSIS — N186 End stage renal disease: Secondary | ICD-10-CM | POA: Diagnosis not present

## 2021-05-05 DIAGNOSIS — E876 Hypokalemia: Secondary | ICD-10-CM | POA: Diagnosis not present

## 2021-05-05 DIAGNOSIS — D689 Coagulation defect, unspecified: Secondary | ICD-10-CM | POA: Diagnosis not present

## 2021-05-05 NOTE — Telephone Encounter (Signed)
Please advise 

## 2021-05-06 ENCOUNTER — Ambulatory Visit: Payer: PPO | Admitting: Podiatry

## 2021-05-06 ENCOUNTER — Other Ambulatory Visit: Payer: Self-pay

## 2021-05-06 ENCOUNTER — Other Ambulatory Visit: Payer: Self-pay | Admitting: Podiatry

## 2021-05-06 DIAGNOSIS — L97412 Non-pressure chronic ulcer of right heel and midfoot with fat layer exposed: Secondary | ICD-10-CM

## 2021-05-06 DIAGNOSIS — E11621 Type 2 diabetes mellitus with foot ulcer: Secondary | ICD-10-CM

## 2021-05-06 MED ORDER — GABAPENTIN 300 MG PO CAPS: ORAL_CAPSULE | ORAL | 0 refills | Status: DC

## 2021-05-06 NOTE — Progress Notes (Signed)
  Subjective:  Patient ID: Rodney Farley, male    DOB: 03-02-1932,  MRN: [id number]  Chief Complaint  Patient presents with   Diabetic Ulcer    Recheck right midfoot ulcer. Pt is doing well.    87 y.o. male presents for wound care.   Objective:  Physical Exam: Wound Location: right 1st met Wound Measurement: healed Wound Base: - Peri-wound: intact Exudate: None: wound tissue dry wound without warmth, erythema, signs of acute infection Assessment:   1. Diabetic ulcer of right midfoot associated with type 2 diabetes mellitus, with fat layer exposed ([place])    Plan:  Patient was evaluated and treated and all questions answered.  Ulcer Right 1st metatarsal -Ulcer appears healed. Continue DM shoe with offloading insert. Will see for regular at risk foot care and continue to monitor.  Return in about 3 months (around 08/06/2021) for Diabetic Foot Care.

## 2021-05-09 ENCOUNTER — Encounter: Payer: Self-pay | Admitting: Family Medicine

## 2021-05-12 ENCOUNTER — Telehealth: Payer: Self-pay | Admitting: Family Medicine

## 2021-05-12 DIAGNOSIS — Z992 Dependence on renal dialysis: Secondary | ICD-10-CM | POA: Diagnosis not present

## 2021-05-12 DIAGNOSIS — R531 Weakness: Secondary | ICD-10-CM

## 2021-05-12 DIAGNOSIS — W19XXXA Unspecified fall, initial encounter: Secondary | ICD-10-CM

## 2021-05-12 DIAGNOSIS — E1129 Type 2 diabetes mellitus with other diabetic kidney complication: Secondary | ICD-10-CM | POA: Diagnosis not present

## 2021-05-12 DIAGNOSIS — D631 Anemia in chronic kidney disease: Secondary | ICD-10-CM | POA: Diagnosis not present

## 2021-05-12 DIAGNOSIS — N2581 Secondary hyperparathyroidism of renal origin: Secondary | ICD-10-CM | POA: Diagnosis not present

## 2021-05-12 DIAGNOSIS — D689 Coagulation defect, unspecified: Secondary | ICD-10-CM | POA: Diagnosis not present

## 2021-05-12 DIAGNOSIS — N186 End stage renal disease: Secondary | ICD-10-CM | POA: Diagnosis not present

## 2021-05-12 NOTE — Telephone Encounter (Signed)
Son here today- on [place].   Worsening generalized weakness- having some low impact falls- no head trauma or injuries. Did better with PT - will refer back at this point- is able to drive himself though could consider home health in future as does need assistive device

## 2021-05-14 DIAGNOSIS — Z992 Dependence on renal dialysis: Secondary | ICD-10-CM | POA: Diagnosis not present

## 2021-05-14 DIAGNOSIS — D689 Coagulation defect, unspecified: Secondary | ICD-10-CM | POA: Diagnosis not present

## 2021-05-14 DIAGNOSIS — N2581 Secondary hyperparathyroidism of renal origin: Secondary | ICD-10-CM | POA: Diagnosis not present

## 2021-05-14 DIAGNOSIS — N186 End stage renal disease: Secondary | ICD-10-CM | POA: Diagnosis not present

## 2021-05-14 DIAGNOSIS — D631 Anemia in chronic kidney disease: Secondary | ICD-10-CM | POA: Diagnosis not present

## 2021-05-14 DIAGNOSIS — E876 Hypokalemia: Secondary | ICD-10-CM | POA: Diagnosis not present

## 2021-05-15 ENCOUNTER — Ambulatory Visit (INDEPENDENT_AMBULATORY_CARE_PROVIDER_SITE_OTHER): Payer: PPO

## 2021-05-15 DIAGNOSIS — I442 Atrioventricular block, complete: Secondary | ICD-10-CM

## 2021-05-15 LAB — CUP PACEART REMOTE DEVICE CHECK
Battery Remaining Longevity: 3 mo
Battery Voltage: 2.75 V
Brady Statistic AP VP Percent: 89.47 %
Brady Statistic AP VS Percent: 0.04 %
Brady Statistic AS VP Percent: 10.47 %
Brady Statistic AS VS Percent: 0.02 %
Brady Statistic RA Percent Paced: 89.45 %
Brady Statistic RV Percent Paced: 99.89 %
Date Time Interrogation Session: 20221103043824
HighPow Impedance: 74 Ohm
Implantable Lead Implant Date: 20100112
Implantable Lead Implant Date: 20170313
Implantable Lead Implant Date: 20170313
Implantable Lead Location: 753858
Implantable Lead Location: 753859
Implantable Lead Location: 753860
Implantable Lead Model: 4598
Implantable Lead Model: 5076
Implantable Pulse Generator Implant Date: 20170313
Lead Channel Impedance Value: 418 Ohm
Lead Channel Impedance Value: 418 Ohm
Lead Channel Impedance Value: 456 Ohm
Lead Channel Impedance Value: 513 Ohm
Lead Channel Impedance Value: 513 Ohm
Lead Channel Impedance Value: 513 Ohm
Lead Channel Impedance Value: 532 Ohm
Lead Channel Impedance Value: 608 Ohm
Lead Channel Impedance Value: 817 Ohm
Lead Channel Impedance Value: 836 Ohm
Lead Channel Impedance Value: 874 Ohm
Lead Channel Impedance Value: 893 Ohm
Lead Channel Impedance Value: 931 Ohm
Lead Channel Pacing Threshold Amplitude: 0.5 V
Lead Channel Pacing Threshold Amplitude: 1.25 V
Lead Channel Pacing Threshold Amplitude: 1.625 V
Lead Channel Pacing Threshold Pulse Width: 0.4 ms
Lead Channel Pacing Threshold Pulse Width: 0.4 ms
Lead Channel Pacing Threshold Pulse Width: 0.4 ms
Lead Channel Sensing Intrinsic Amplitude: 1 mV
Lead Channel Sensing Intrinsic Amplitude: 1 mV
Lead Channel Sensing Intrinsic Amplitude: 12.375 mV
Lead Channel Sensing Intrinsic Amplitude: 12.375 mV
Lead Channel Setting Pacing Amplitude: 2.5 V
Lead Channel Setting Pacing Amplitude: 2.75 V
Lead Channel Setting Pacing Amplitude: 3.25 V
Lead Channel Setting Pacing Pulse Width: 0.4 ms
Lead Channel Setting Pacing Pulse Width: 0.4 ms
Lead Channel Setting Sensing Sensitivity: 0.3 mV

## 2021-05-19 ENCOUNTER — Other Ambulatory Visit: Payer: Self-pay

## 2021-05-19 ENCOUNTER — Other Ambulatory Visit: Payer: Self-pay | Admitting: Family Medicine

## 2021-05-19 DIAGNOSIS — D631 Anemia in chronic kidney disease: Secondary | ICD-10-CM | POA: Diagnosis not present

## 2021-05-19 DIAGNOSIS — D689 Coagulation defect, unspecified: Secondary | ICD-10-CM | POA: Diagnosis not present

## 2021-05-19 DIAGNOSIS — Z992 Dependence on renal dialysis: Secondary | ICD-10-CM | POA: Diagnosis not present

## 2021-05-19 DIAGNOSIS — R531 Weakness: Secondary | ICD-10-CM

## 2021-05-19 DIAGNOSIS — N2581 Secondary hyperparathyroidism of renal origin: Secondary | ICD-10-CM | POA: Diagnosis not present

## 2021-05-19 DIAGNOSIS — N186 End stage renal disease: Secondary | ICD-10-CM | POA: Diagnosis not present

## 2021-05-19 DIAGNOSIS — Z9181 History of falling: Secondary | ICD-10-CM

## 2021-05-20 ENCOUNTER — Encounter: Payer: Self-pay | Admitting: Cardiology

## 2021-05-20 ENCOUNTER — Telehealth: Payer: Self-pay | Admitting: Internal Medicine

## 2021-05-20 MED ORDER — SEVELAMER CARBONATE 800 MG PO TABS: 1600.0000 mg | ORAL_TABLET | Freq: Two times a day (BID) | ORAL | 3 refills | Status: DC

## 2021-05-20 NOTE — Telephone Encounter (Signed)
Patient's son and POA calling to speak with a nurse about the patients care. He would like to discuss updates and recent changes.

## 2021-05-20 NOTE — Telephone Encounter (Signed)
error 

## 2021-05-20 NOTE — Telephone Encounter (Signed)
Spoke with pt's son, DPR who states pt received a message today stating he is nearing ERI with his device (approximately 3 months) and Dr Caryl Comes would like to have a discussion re: end of life.  Pt is scheduled for 08/26/2020.  Pt's son advised pt will have 3 months from reaching ERI to have device replaced if he chooses to move forward.  Pt's son verbalizes understanding and agrees with current plan.

## 2021-05-21 NOTE — Progress Notes (Signed)
Remote ICD transmission.   

## 2021-05-26 DIAGNOSIS — D631 Anemia in chronic kidney disease: Secondary | ICD-10-CM | POA: Diagnosis not present

## 2021-05-26 DIAGNOSIS — N186 End stage renal disease: Secondary | ICD-10-CM | POA: Diagnosis not present

## 2021-05-26 DIAGNOSIS — N2581 Secondary hyperparathyroidism of renal origin: Secondary | ICD-10-CM | POA: Diagnosis not present

## 2021-05-26 DIAGNOSIS — D689 Coagulation defect, unspecified: Secondary | ICD-10-CM | POA: Diagnosis not present

## 2021-05-26 DIAGNOSIS — Z992 Dependence on renal dialysis: Secondary | ICD-10-CM | POA: Diagnosis not present

## 2021-05-26 DIAGNOSIS — E876 Hypokalemia: Secondary | ICD-10-CM | POA: Diagnosis not present

## 2021-06-02 DIAGNOSIS — D689 Coagulation defect, unspecified: Secondary | ICD-10-CM | POA: Diagnosis not present

## 2021-06-02 DIAGNOSIS — N2581 Secondary hyperparathyroidism of renal origin: Secondary | ICD-10-CM | POA: Diagnosis not present

## 2021-06-02 DIAGNOSIS — Z992 Dependence on renal dialysis: Secondary | ICD-10-CM | POA: Diagnosis not present

## 2021-06-02 DIAGNOSIS — E876 Hypokalemia: Secondary | ICD-10-CM | POA: Diagnosis not present

## 2021-06-02 DIAGNOSIS — N186 End stage renal disease: Secondary | ICD-10-CM | POA: Diagnosis not present

## 2021-06-02 DIAGNOSIS — D631 Anemia in chronic kidney disease: Secondary | ICD-10-CM | POA: Diagnosis not present

## 2021-06-09 DIAGNOSIS — N186 End stage renal disease: Secondary | ICD-10-CM | POA: Diagnosis not present

## 2021-06-09 DIAGNOSIS — D631 Anemia in chronic kidney disease: Secondary | ICD-10-CM | POA: Diagnosis not present

## 2021-06-09 DIAGNOSIS — D689 Coagulation defect, unspecified: Secondary | ICD-10-CM | POA: Diagnosis not present

## 2021-06-09 DIAGNOSIS — N2581 Secondary hyperparathyroidism of renal origin: Secondary | ICD-10-CM | POA: Diagnosis not present

## 2021-06-09 DIAGNOSIS — E876 Hypokalemia: Secondary | ICD-10-CM | POA: Diagnosis not present

## 2021-06-09 DIAGNOSIS — Z992 Dependence on renal dialysis: Secondary | ICD-10-CM | POA: Diagnosis not present

## 2021-06-11 DIAGNOSIS — N186 End stage renal disease: Secondary | ICD-10-CM | POA: Diagnosis not present

## 2021-06-11 DIAGNOSIS — E1129 Type 2 diabetes mellitus with other diabetic kidney complication: Secondary | ICD-10-CM | POA: Diagnosis not present

## 2021-06-11 DIAGNOSIS — Z992 Dependence on renal dialysis: Secondary | ICD-10-CM | POA: Diagnosis not present

## 2021-06-13 DIAGNOSIS — Z992 Dependence on renal dialysis: Secondary | ICD-10-CM | POA: Diagnosis not present

## 2021-06-13 DIAGNOSIS — N2581 Secondary hyperparathyroidism of renal origin: Secondary | ICD-10-CM | POA: Diagnosis not present

## 2021-06-13 DIAGNOSIS — D631 Anemia in chronic kidney disease: Secondary | ICD-10-CM | POA: Diagnosis not present

## 2021-06-13 DIAGNOSIS — D689 Coagulation defect, unspecified: Secondary | ICD-10-CM | POA: Diagnosis not present

## 2021-06-13 DIAGNOSIS — N186 End stage renal disease: Secondary | ICD-10-CM | POA: Diagnosis not present

## 2021-06-13 MED ORDER — GABAPENTIN 300 MG PO CAPS: ORAL_CAPSULE | ORAL | 0 refills | Status: DC

## 2021-06-13 NOTE — Telephone Encounter (Signed)
Please advise 

## 2021-06-16 ENCOUNTER — Ambulatory Visit (INDEPENDENT_AMBULATORY_CARE_PROVIDER_SITE_OTHER): Payer: PPO

## 2021-06-16 DIAGNOSIS — I442 Atrioventricular block, complete: Secondary | ICD-10-CM

## 2021-06-16 DIAGNOSIS — D689 Coagulation defect, unspecified: Secondary | ICD-10-CM | POA: Diagnosis not present

## 2021-06-16 DIAGNOSIS — N186 End stage renal disease: Secondary | ICD-10-CM | POA: Diagnosis not present

## 2021-06-16 DIAGNOSIS — D631 Anemia in chronic kidney disease: Secondary | ICD-10-CM | POA: Diagnosis not present

## 2021-06-16 DIAGNOSIS — Z992 Dependence on renal dialysis: Secondary | ICD-10-CM | POA: Diagnosis not present

## 2021-06-16 DIAGNOSIS — E876 Hypokalemia: Secondary | ICD-10-CM | POA: Diagnosis not present

## 2021-06-16 DIAGNOSIS — N2581 Secondary hyperparathyroidism of renal origin: Secondary | ICD-10-CM | POA: Diagnosis not present

## 2021-06-17 LAB — CUP PACEART REMOTE DEVICE CHECK
Battery Remaining Longevity: 2 mo
Battery Voltage: 2.76 V
Brady Statistic AP VP Percent: 89.85 %
Brady Statistic AP VS Percent: 0.02 %
Brady Statistic AS VP Percent: 10.1 %
Brady Statistic AS VS Percent: 0.03 %
Brady Statistic RA Percent Paced: 89.79 %
Brady Statistic RV Percent Paced: 99.86 %
Date Time Interrogation Session: 20221205001804
HighPow Impedance: 65 Ohm
Implantable Lead Implant Date: 20100112
Implantable Lead Implant Date: 20170313
Implantable Lead Implant Date: 20170313
Implantable Lead Location: 753858
Implantable Lead Location: 753859
Implantable Lead Location: 753860
Implantable Lead Model: 4598
Implantable Lead Model: 5076
Implantable Pulse Generator Implant Date: 20170313
Lead Channel Impedance Value: 361 Ohm
Lead Channel Impedance Value: 399 Ohm
Lead Channel Impedance Value: 399 Ohm
Lead Channel Impedance Value: 418 Ohm
Lead Channel Impedance Value: 456 Ohm
Lead Channel Impedance Value: 475 Ohm
Lead Channel Impedance Value: 475 Ohm
Lead Channel Impedance Value: 551 Ohm
Lead Channel Impedance Value: 703 Ohm
Lead Channel Impedance Value: 722 Ohm
Lead Channel Impedance Value: 760 Ohm
Lead Channel Impedance Value: 779 Ohm
Lead Channel Impedance Value: 779 Ohm
Lead Channel Pacing Threshold Amplitude: 0.5 V
Lead Channel Pacing Threshold Amplitude: 1.5 V
Lead Channel Pacing Threshold Amplitude: 1.5 V
Lead Channel Pacing Threshold Pulse Width: 0.4 ms
Lead Channel Pacing Threshold Pulse Width: 0.4 ms
Lead Channel Pacing Threshold Pulse Width: 0.4 ms
Lead Channel Sensing Intrinsic Amplitude: 0.375 mV
Lead Channel Sensing Intrinsic Amplitude: 0.375 mV
Lead Channel Sensing Intrinsic Amplitude: 12.375 mV
Lead Channel Sensing Intrinsic Amplitude: 12.375 mV
Lead Channel Setting Pacing Amplitude: 2.5 V
Lead Channel Setting Pacing Amplitude: 3 V
Lead Channel Setting Pacing Amplitude: 3 V
Lead Channel Setting Pacing Pulse Width: 0.4 ms
Lead Channel Setting Pacing Pulse Width: 0.4 ms
Lead Channel Setting Sensing Sensitivity: 0.3 mV

## 2021-06-19 ENCOUNTER — Other Ambulatory Visit: Payer: Self-pay | Admitting: Podiatry

## 2021-06-20 MED ORDER — GABAPENTIN 300 MG PO CAPS: ORAL_CAPSULE | ORAL | 0 refills | Status: DC

## 2021-06-23 DIAGNOSIS — N2581 Secondary hyperparathyroidism of renal origin: Secondary | ICD-10-CM | POA: Diagnosis not present

## 2021-06-23 DIAGNOSIS — D631 Anemia in chronic kidney disease: Secondary | ICD-10-CM | POA: Diagnosis not present

## 2021-06-23 DIAGNOSIS — D689 Coagulation defect, unspecified: Secondary | ICD-10-CM | POA: Diagnosis not present

## 2021-06-23 DIAGNOSIS — Z992 Dependence on renal dialysis: Secondary | ICD-10-CM | POA: Diagnosis not present

## 2021-06-23 DIAGNOSIS — N186 End stage renal disease: Secondary | ICD-10-CM | POA: Diagnosis not present

## 2021-06-25 NOTE — Addendum Note (Signed)
Addended by: Douglass Rivers D on: 06/25/2021 10:14 AM   Modules accepted: Level of Service

## 2021-06-25 NOTE — Progress Notes (Signed)
Remote ICD transmission.   

## 2021-07-17 ENCOUNTER — Ambulatory Visit (INDEPENDENT_AMBULATORY_CARE_PROVIDER_SITE_OTHER): Payer: PPO

## 2021-07-17 ENCOUNTER — Ambulatory Visit: Payer: PPO | Attending: Family Medicine | Admitting: Physical Therapy

## 2021-07-17 ENCOUNTER — Other Ambulatory Visit: Payer: Self-pay | Admitting: Family Medicine

## 2021-07-17 ENCOUNTER — Other Ambulatory Visit: Payer: Self-pay

## 2021-07-17 DIAGNOSIS — I442 Atrioventricular block, complete: Secondary | ICD-10-CM

## 2021-07-17 DIAGNOSIS — M6281 Muscle weakness (generalized): Secondary | ICD-10-CM

## 2021-07-17 DIAGNOSIS — R2689 Other abnormalities of gait and mobility: Secondary | ICD-10-CM | POA: Diagnosis not present

## 2021-07-17 DIAGNOSIS — W19XXXA Unspecified fall, initial encounter: Secondary | ICD-10-CM | POA: Diagnosis not present

## 2021-07-17 DIAGNOSIS — R296 Repeated falls: Secondary | ICD-10-CM

## 2021-07-17 LAB — CUP PACEART REMOTE DEVICE CHECK
Battery Remaining Longevity: 2 mo
Battery Voltage: 2.74 V
Brady Statistic AP VP Percent: 80.45 %
Brady Statistic AP VS Percent: 0.02 %
Brady Statistic AS VP Percent: 19.48 %
Brady Statistic AS VS Percent: 0.05 %
Brady Statistic RA Percent Paced: 80.3 %
Brady Statistic RV Percent Paced: 99.78 %
Date Time Interrogation Session: 20230105012204
HighPow Impedance: 69 Ohm
Implantable Lead Implant Date: 20100112
Implantable Lead Implant Date: 20170313
Implantable Lead Implant Date: 20170313
Implantable Lead Location: 753858
Implantable Lead Location: 753859
Implantable Lead Location: 753860
Implantable Lead Model: 4598
Implantable Lead Model: 5076
Implantable Pulse Generator Implant Date: 20170313
Lead Channel Impedance Value: 399 Ohm
Lead Channel Impedance Value: 418 Ohm
Lead Channel Impedance Value: 418 Ohm
Lead Channel Impedance Value: 475 Ohm
Lead Channel Impedance Value: 475 Ohm
Lead Channel Impedance Value: 475 Ohm
Lead Channel Impedance Value: 532 Ohm
Lead Channel Impedance Value: 589 Ohm
Lead Channel Impedance Value: 760 Ohm
Lead Channel Impedance Value: 779 Ohm
Lead Channel Impedance Value: 836 Ohm
Lead Channel Impedance Value: 836 Ohm
Lead Channel Impedance Value: 874 Ohm
Lead Channel Pacing Threshold Amplitude: 0.5 V
Lead Channel Pacing Threshold Amplitude: 1.25 V
Lead Channel Pacing Threshold Amplitude: 1.375 V
Lead Channel Pacing Threshold Pulse Width: 0.4 ms
Lead Channel Pacing Threshold Pulse Width: 0.4 ms
Lead Channel Pacing Threshold Pulse Width: 0.4 ms
Lead Channel Sensing Intrinsic Amplitude: 0.875 mV
Lead Channel Sensing Intrinsic Amplitude: 0.875 mV
Lead Channel Sensing Intrinsic Amplitude: 12.375 mV
Lead Channel Sensing Intrinsic Amplitude: 12.375 mV
Lead Channel Setting Pacing Amplitude: 2.5 V
Lead Channel Setting Pacing Amplitude: 2.75 V
Lead Channel Setting Pacing Amplitude: 2.75 V
Lead Channel Setting Pacing Pulse Width: 0.4 ms
Lead Channel Setting Pacing Pulse Width: 0.4 ms
Lead Channel Setting Sensing Sensitivity: 0.3 mV
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
## 2021-07-22 ENCOUNTER — Encounter: Payer: PPO | Admitting: Rehabilitative and Restorative Service Providers"

## 2021-07-22 ENCOUNTER — Encounter (HOSPITAL_COMMUNITY): Payer: Self-pay

## 2021-07-22 ENCOUNTER — Telehealth: Payer: Self-pay | Admitting: Rehabilitative and Restorative Service Providers"

## 2021-07-22 NOTE — Telephone Encounter (Signed)
Pt did not show for his 07/22/2021 appointment at 12:30 pm.  Called pt to remind of missed visit. Spoke to son and he stated that he was not sure why patient was not at his appointment, but he would follow up with his father/patient.  Reminded son of patients next appointment on 07/24/2021, and he verbalized understanding.

## 2021-07-23 ENCOUNTER — Encounter (HOSPITAL_COMMUNITY): Payer: Self-pay

## 2021-07-24 ENCOUNTER — Encounter: Payer: Self-pay | Admitting: Rehabilitative and Restorative Service Providers"

## 2021-07-24 ENCOUNTER — Other Ambulatory Visit: Payer: Self-pay

## 2021-07-24 ENCOUNTER — Ambulatory Visit: Payer: PPO | Admitting: Rehabilitative and Restorative Service Providers"

## 2021-07-24 DIAGNOSIS — M6281 Muscle weakness (generalized): Secondary | ICD-10-CM

## 2021-07-24 DIAGNOSIS — R2689 Other abnormalities of gait and mobility: Secondary | ICD-10-CM

## 2021-07-24 DIAGNOSIS — R296 Repeated falls: Secondary | ICD-10-CM
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
## 2021-07-24 NOTE — Patient Instructions (Signed)
Access Code: [facility] URL: [email or web address] Date: 07/24/2021 Prepared by: Shelby Dubin Airanna Partin  Exercises Seated Long Arc Quad - 3 x daily - 7 x weekly - 2 sets - 10 reps - 5 hold Seated March - 3 x daily - 7 x weekly - 3 sets - 10 reps Seated Heel Toe Raises - 3 x daily - 7 x weekly - 2 sets - 10 reps Seated Isometric Hip Adduction with Ball - 3 x daily - 7 x weekly - 2 sets - 10 reps Sit to Stand with Armchair - 1 x daily - 7 x weekly - 2 sets - 4 reps Side Stepping with Counter Support - 1 x daily - 7 x weekly - 1 sets - 5 reps Push-Up on Counter - 1 x daily - 7 x weekly - 1 sets - 10 reps

## 2021-07-28 NOTE — Progress Notes (Signed)
Remote ICD transmission.   

## 2021-07-29 ENCOUNTER — Ambulatory Visit: Payer: PPO | Admitting: Physical Therapy

## 2021-07-29 ENCOUNTER — Other Ambulatory Visit: Payer: Self-pay

## 2021-07-29 DIAGNOSIS — M6281 Muscle weakness (generalized): Secondary | ICD-10-CM | POA: Diagnosis not present

## 2021-07-29 DIAGNOSIS — R296 Repeated falls: Secondary | ICD-10-CM

## 2021-07-29 DIAGNOSIS — R2689 Other abnormalities of gait and mobility: Secondary | ICD-10-CM

## 2021-07-29 NOTE — Therapy (Signed)
[facility] @ [place], [place], [address] Phone: [phone]   Fax:  [phone]  Physical Therapy Treatment  Patient Details  Name: Rodney Farley MRN: [id number] Date of Birth: 09/25/1931 Referring Provider (PT): Dr. Garret Reddish   Encounter Date: 07/29/2021   PT End of Session - 07/29/21 1713     Visit Number 3    Date for PT Re-Evaluation 09/11/21    Authorization Type [employer] 10th visit progress note    Progress Note Due on Visit 10    PT Start Time 6812    PT Stop Time 1228    PT Time Calculation (min) 43 min    Activity Tolerance Patient tolerated treatment well             Past Medical History:  Diagnosis Date   AICD (automatic cardioverter/defibrillator) present    AKI (acute kidney injury) ([place]) 03/27/2017   Anemia    Anginal pain ([place])    Anxiety    Arm pain 05/08/2015   LEFT ARM   Arthritis    Atrioventricular block, complete ([place])    a. 2010 s/p pacemaker.   Bell's palsy    left side. after shingles episode   Bilateral renal cysts 07/23/2017   Simple and hemorrhagic noted on CT ab/pelvis    BPH associated with nocturia    Cardiomegaly    Chronic systolic CHF (congestive heart failure) ([place])    EF normalized by Echo 2019   CKD (chronic kidney disease), stage IV (HCC)    COPD (chronic obstructive pulmonary disease) ([place])    Severe   Coronary artery disease    a. s/p MI in 1994/1995 while in [place] s/p questionable PCI. 03/2015: progression of disease, for staged PCI.   Depression    Diabetic peripheral neuropathy (HCC)    Diarrhea    Diverticulitis    DOE (dyspnea on exertion) 03/28/2015   Full dentures    Gallstones    GERD (gastroesophageal reflux disease)    Gout    Hard of hearing    B/L   Heart murmur    History of chronic pancreatitis 07/23/2017   noted on CT abd/pelvis   History of shingles    Hypercholesterolemia    Hypertension     Ischemic cardiomyopathy    MI (myocardial infarction) ([place]) 1994; 1995   Neuropathy    IN LOWER EXTREMITIES   Nosebleed 10/06/2017   for 2 months most recent 10/06/2017   Obesity    Pacemaker    medtronic>>> MDT ICD 09/23/15   Ruptured appendicitis    Sleep apnea    "sleeps w/humidifyer when he panics and gets short of breath" (04/08/2015)   TIA (transient ischemic attack) X 3   Trigger middle finger of left hand    Type II diabetes mellitus ([place])    Wears glasses    Wears hearing aid     Past Surgical History:  Procedure Laterality Date   APPENDECTOMY     AV FISTULA PLACEMENT Right 02/07/2019   Procedure: ARTERIOVENOUS (AV) FISTULA CREATION RIGHT UPPER ARM;  Surgeon: Waynetta Sandy, MD;  Location: [facility];  Service: Vascular;  Laterality: Right;   CARDIAC CATHETERIZATION N/A 03/29/2015   Procedure: Right/Left Heart Cath and Coronary Angiography;  Surgeon: Banyan M [name], MD;  Location: [facility];  Service: Cardiovascular;  Laterality: N/A;   [place]   "after my MI; put me on heart RX after cath"  CARDIAC CATHETERIZATION N/A 04/09/2015   Procedure: Coronary Stent Intervention;  Surgeon: Chawn M [name], MD;  Location: [facility];  Service: Cardiovascular;  Laterality: N/A;   CATARACT EXTRACTION W/ INTRAOCULAR LENS  IMPLANT, BILATERAL     CHOLECYSTECTOMY N/A 10/13/2017   Procedure: LAPAROSCOPIC CHOLECYSTECTOMY WITH LYSIS OF ADHESIONS;  Surgeon: Ileana Roup, MD;  [facility];  Service: General;  Laterality: N/A;   COLONOSCOPY     DENTAL SURGERY     EP IMPLANTABLE DEVICE N/A 09/23/2015   MDT CRT-D, Dr. Caryl Comes   HIATAL HERNIA REPAIR  1977   ILEOCECETOMY N/A 03/27/2017   Procedure: ILEOCECECTOMY;  Surgeon: Ileana Roup, MD;  Location: [facility];  Service: General;  Laterality: N/A;   INSERT / REPLACE / REMOVE PACEMAKER  07/2008   Complete heart block status post DDD with good function   IR FLUORO GUIDE CV LINE RIGHT   04/03/2019   IR US GUIDE [place] RIGHT  04/03/2019   LAPAROTOMY N/A 03/27/2017   Procedure: EXPLORATORY LAPAROTOMY;  Surgeon: Ileana Roup, MD;  Location: [facility];  Service: General;  Laterality: N/A;   TONSILLECTOMY     UPPER GI ENDOSCOPY      There were no vitals filed for this visit.   Subjective Assessment - 07/29/21 1149     Subjective I had some soreness after last time but not a distressing amount.  No pain today.    Pertinent History dialysis Mon, Wed, Fri;  pacemaker with defibrillator; DN, COPD, HTN; DM; neuropathy    Patient Stated Goals I hope we might proceed with practice to get up off the floor and gain strength in my leg; stoop to clean oven/stove    Currently in Pain? No/denies    Pain Score 0-No pain                               [facility] Adult PT Treatment/Exercise - 07/29/21 0001       Neuro Re-ed    Neuro Re-ed Details  color toe taps, single and double touches      Knee/Hip Exercises: Aerobic   Nustep L3 8 min while discussing status      Knee/Hip Exercises: Standing   Other Standing Knee Exercises 5# kettlebell partial dead lifts 10x right/left single arm each side   1st set with UE support, 2nd set patient without UE and had loss of balance req max assist   Other Standing Knee Exercises wall push ups 10x      Knee/Hip Exercises: Seated   Other Seated Knee/Hip Exercises 5# kettlebell hip flexion cone taps 10x right/left    Other Seated Knee/Hip Exercises 5# chair sit up 10x    Sit to Sand 1 set;5 reps   with 5# kettlebell     Shoulder Exercises: Seated   Other Seated Exercises red band rows 20x                       PT Short Term Goals - 07/24/21 1243       PT SHORT TERM GOAL #1   Title be independent in initial HEP    Status On-going               PT Long Term Goals - 07/17/21 1851       PT LONG TERM GOAL #1   Title be independent in advanced HEP    Time 8  Period Weeks    Status New     Target Date 09/11/21      PT LONG TERM GOAL #2   Title perform TUG in < or = to 22 seconds to improve mobility and reduce falls risk    Time 8    Period Weeks    Status New      PT LONG TERM GOAL #3   Title Improved LE strength to walk 475 feet in 3 minutes    Time 8    Period Weeks    Status New      PT LONG TERM GOAL #4   Title demonstrate 4/5 bil LE strength to improve safety and ability to stoop/squat and to help with getting up off the floor    Time 8    Period Weeks    Status New      PT LONG TERM GOAL #5   Title 5x sit to stand test (no UE use) in 14.25 sec    Time 8    Period Weeks    Status New      PT LONG TERM GOAL #6   Title No loss of balance with 180 turns 3/3 trials    Time 8    Period Weeks    Status New      PT LONG TERM GOAL #7   Title BERG score improved to 34/56 indicating improved balance and decreased risk of falls    Time 8    Period Weeks    Status New                   Plan - 07/29/21 1714     Clinical Impression Statement The patient is able to perform a progression of strength and balance ex's fairly well.  He has mild shortness of breath appropriate for the level of activity but this quickly improves with a short rest.  He requires light to medium UE support with most ex's.  When he attempts mini dead lifts without support he has a complete loss of balance in the posterior direction requiring therapist assist to stabilize.  Will continue to work on components of pulling and pushing up and leg power and strength to prepare for his ultimate long term goal of practice getting up off the floor.    Comorbidities CKD with dialysis Mon, Wed,Fri;  DM;  pacemaker/defibrillator; COPD; HTN; neuropathy    Examination-Participation Restrictions Cleaning;Community Activity;Driving;Interpersonal Relationship    Rehab Potential Good    PT Frequency 2x / week    PT Duration 8 weeks    PT Treatment/Interventions ADLs/Self Care Home  Management;Therapeutic activities;Neuromuscular re-education;Therapeutic exercise;Manual techniques;Patient/family education;Stair training;Gait training;Functional mobility training    PT Next Visit Plan Nu-Step; wall push ups; try mini lunges; sit to stands; standing ex's for balance and strength; seated core and LE strengthening    PT Home Exercise Plan Access Code [facility]             Patient will benefit from skilled therapeutic intervention in order to improve the following deficits and impairments:  Difficulty walking, Pain, Decreased activity tolerance, Impaired perceived functional ability, Decreased balance, Decreased strength, Decreased mobility  Visit Diagnosis: Muscle weakness (generalized)  Other abnormalities of gait and mobility  Repeated falls     Problem List Patient Active Problem List   Diagnosis Date Noted   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 07/29/2020   Dependence on renal dialysis ([place]) 11/13/2019   Coagulation defect, unspecified ([place]) 05/11/2019  Encounter for immunization 04/25/2019   Thrombocytopenia ([employer]) 04/13/2019   Hypokalemia 04/12/2019   Anaphylactic shock, unspecified, initial encounter 04/10/2019   Anemia in chronic kidney disease 94/80/1655   Complication of vascular dialysis catheter 04/10/2019   Iron deficiency anemia, unspecified 04/10/2019   Secondary hyperparathyroidism of renal origin ([place]) 04/10/2019   Hyperkalemia, diminished renal excretion 04/01/2019   Hyperlipidemia associated with type 2 diabetes mellitus ([place]) 04/13/2018   S/P cholecystectomy 10/13/2017   Diarrhea 05/11/2017   Ruptured appendicitis 03/26/2017   Marital stress 02/18/2017   Trigger finger 06/10/2016   BPH associated with nocturia 04/27/2016   Diabetic peripheral neuropathy (HCC)    COPD (chronic obstructive pulmonary disease) ([place])    Bell's palsy    Gout 37/48/2707   Chronic systolic CHF (congestive heart failure) ([place])  04/08/2015   Diverticulitis 04/08/2015   DOE (dyspnea on exertion), due to significant CAD 03/28/2015   ESRD on hemodialysis ([place]) 03/04/2015   CKD (chronic kidney disease) stage 4, GFR 15-29 ml/min (HCC) 03/04/2015   CAD (coronary artery disease) 09/13/2014   GERD (gastroesophageal reflux disease) 08/07/2012   Major depression, recurrent, full remission ([place]) 08/05/2012   Sleep apnea 08/05/2012   Controlled type 2 diabetes mellitus with chronic kidney disease on chronic dialysis ([place]) 08/05/2012   Hypertension associated with diabetes ([place]) 08/05/2012   Diabetes type 2, uncontrolled 08/05/2012   Ischemic cardiomyopathy 11/03/2011   Atrioventricular block, complete [facility])    Obesity    Pacemaker    Ruben Im, PT 07/29/21 5:22 PM Phone: [phone] Fax: [phone]  Alvera Singh, PT 07/29/2021, 5:21 PM  [facility] @ [place], [place], [address] Phone: [phone]   Fax:  [phone]  Name: Rodney Farley MRN: [id number] Date of Birth: 07-16-1931

## 2021-07-31 ENCOUNTER — Other Ambulatory Visit: Payer: Self-pay

## 2021-07-31 ENCOUNTER — Ambulatory Visit: Payer: PPO | Admitting: Physical Therapy

## 2021-07-31 ENCOUNTER — Other Ambulatory Visit: Payer: Self-pay | Admitting: Family Medicine

## 2021-07-31 DIAGNOSIS — M6281 Muscle weakness (generalized): Secondary | ICD-10-CM

## 2021-07-31 DIAGNOSIS — R296 Repeated falls: Secondary | ICD-10-CM

## 2021-07-31 DIAGNOSIS — R2689 Other abnormalities of gait and mobility: Secondary | ICD-10-CM

## 2021-07-31 MED ORDER — LANSOPRAZOLE 15 MG PO CPDR: DELAYED_RELEASE_CAPSULE | ORAL | 0 refills | Status: DC

## 2021-07-31 NOTE — Therapy (Signed)
[facility] @ [place], [place], [address] Phone: [phone]   Fax:  [phone]  Physical Therapy Treatment  Patient Details  Name: Rodney Farley MRN: [id number] Date of Birth: Mar 21, 1932 Referring Provider (PT): Dr. Garret Reddish   Encounter Date: 07/31/2021   PT End of Session - 07/31/21 1236     Visit Number 4    Date for PT Re-Evaluation 09/11/21    Authorization Type [employer] 10th visit progress note    Progress Note Due on Visit 10    PT Start Time 1140    PT Stop Time 1225    PT Time Calculation (min) 45 min    Activity Tolerance Patient tolerated treatment well             Past Medical History:  Diagnosis Date   AICD (automatic cardioverter/defibrillator) present    AKI (acute kidney injury) ([place]) 03/27/2017   Anemia    Anginal pain ([place])    Anxiety    Arm pain 05/08/2015   LEFT ARM   Arthritis    Atrioventricular block, complete ([place])    a. 2010 s/p pacemaker.   Bell's palsy    left side. after shingles episode   Bilateral renal cysts 07/23/2017   Simple and hemorrhagic noted on CT ab/pelvis    BPH associated with nocturia    Cardiomegaly    Chronic systolic CHF (congestive heart failure) ([place])    EF normalized by Echo 2019   CKD (chronic kidney disease), stage IV (HCC)    COPD (chronic obstructive pulmonary disease) ([place])    Severe   Coronary artery disease    a. s/p MI in 1994/1995 while in [place] s/p questionable PCI. 03/2015: progression of disease, for staged PCI.   Depression    Diabetic peripheral neuropathy (HCC)    Diarrhea    Diverticulitis    DOE (dyspnea on exertion) 03/28/2015   Full dentures    Gallstones    GERD (gastroesophageal reflux disease)    Gout    Hard of hearing    B/L   Heart murmur    History of chronic pancreatitis 07/23/2017   noted on CT abd/pelvis   History of shingles    Hypercholesterolemia    Hypertension     Ischemic cardiomyopathy    MI (myocardial infarction) ([place]) 1994; 1995   Neuropathy    IN LOWER EXTREMITIES   Nosebleed 10/06/2017   for 2 months most recent 10/06/2017   Obesity    Pacemaker    medtronic>>> MDT ICD 09/23/15   Ruptured appendicitis    Sleep apnea    "sleeps w/humidifyer when he panics and gets short of breath" (04/08/2015)   TIA (transient ischemic attack) X 3   Trigger middle finger of left hand    Type II diabetes mellitus ([place])    Wears glasses    Wears hearing aid     Past Surgical History:  Procedure Laterality Date   APPENDECTOMY     AV FISTULA PLACEMENT Right 02/07/2019   Procedure: ARTERIOVENOUS (AV) FISTULA CREATION RIGHT UPPER ARM;  Surgeon: Waynetta Sandy, MD;  Location: [facility];  Service: Vascular;  Laterality: Right;   CARDIAC CATHETERIZATION N/A 03/29/2015   Procedure: Right/Left Heart Cath and Coronary Angiography;  Surgeon: Erdem M [name], MD;  Location: [facility];  Service: Cardiovascular;  Laterality: N/A;   [place]   "after my MI; put me on heart RX after cath"  CARDIAC CATHETERIZATION N/A 04/09/2015   Procedure: Coronary Stent Intervention;  Surgeon: Oaklee M [name], MD;  Location: [facility];  Service: Cardiovascular;  Laterality: N/A;   CATARACT EXTRACTION W/ INTRAOCULAR LENS  IMPLANT, BILATERAL     CHOLECYSTECTOMY N/A 10/13/2017   Procedure: LAPAROSCOPIC CHOLECYSTECTOMY WITH LYSIS OF ADHESIONS;  Surgeon: Ileana Roup, MD;  [facility];  Service: General;  Laterality: N/A;   COLONOSCOPY     DENTAL SURGERY     EP IMPLANTABLE DEVICE N/A 09/23/2015   MDT CRT-D, Dr. Caryl Comes   HIATAL HERNIA REPAIR  1977   ILEOCECETOMY N/A 03/27/2017   Procedure: ILEOCECECTOMY;  Surgeon: Ileana Roup, MD;  Location: [facility];  Service: General;  Laterality: N/A;   INSERT / REPLACE / REMOVE PACEMAKER  07/2008   Complete heart block status post DDD with good function   IR FLUORO GUIDE CV LINE RIGHT   04/03/2019   IR US GUIDE [place] RIGHT  04/03/2019   LAPAROTOMY N/A 03/27/2017   Procedure: EXPLORATORY LAPAROTOMY;  Surgeon: Ileana Roup, MD;  Location: [facility];  Service: General;  Laterality: N/A;   TONSILLECTOMY     UPPER GI ENDOSCOPY      There were no vitals filed for this visit.   Subjective Assessment - 07/31/21 1140     Subjective The patient is tearful today and grieving from the recent loss of his wife.   Minimal soreness after last visit.    Pertinent History dialysis Mon, Wed, Fri;  pacemaker with defibrillator; DN, COPD, HTN; DM; neuropathy;  RIGHT ARM FISTULA    How long can you walk comfortably? to the mailbox 50 or 60 paces (coming back can be a trial b/c it's uphill)    Patient Stated Goals I hope we might proceed with practice to get up off the floor and gain strength in my leg; stoop to clean oven/stove    Currently in Pain? No/denies    Pain Score 0-No pain                               [facility] Adult PT Treatment/Exercise - 07/31/21 0001       Neuro Re-ed    Neuro Re-ed Details  standing in front of stairs and in front of chair turns to right 90 degrees turning to right;  left turns without UE support; step taps with min UE support      Lumbar Exercises: Seated   Other Seated Lumbar Exercises seated sit ups 5# 2x10      Knee/Hip Exercises: Aerobic   Nustep L3 8 min while discussing status      Knee/Hip Exercises: Standing   Other Standing Knee Exercises 5# kettlebell partial dead lifts 2x 5   close supervision for safety     Knee/Hip Exercises: Seated   Other Seated Knee/Hip Exercises left arm push ups on mat table 2x10    Sit to Sand 2 sets;5 reps   holding 5# kettlebell     Shoulder Exercises: Seated   Other Seated Exercises bent rows left only 5# 2x10                       PT Short Term Goals - 07/31/21 1654       PT SHORT TERM GOAL #1   Title be independent in initial HEP    Time 4    Period Weeks  Status On-going      PT SHORT TERM GOAL #2   Title perform TUG in < or = to 27 seconds to improve mobility and improve balance    Time 4    Period Weeks    Status On-going      PT SHORT TERM GOAL #3   Title improve LE strength to perform sit to stand 5x in 20 sec    Time 4    Period Weeks    Status On-going      PT SHORT TERM GOAL #4   Title BERG score improved to 28/56 indicating improved balance and safety    Time 4    Period Weeks    Status On-going               PT Long Term Goals - 07/17/21 1851       PT LONG TERM GOAL #1   Title be independent in advanced HEP    Time 8    Period Weeks    Status New    Target Date 09/11/21      PT LONG TERM GOAL #2   Title perform TUG in < or = to 22 seconds to improve mobility and reduce falls risk    Time 8    Period Weeks    Status New      PT LONG TERM GOAL #3   Title Improved LE strength to walk 475 feet in 3 minutes    Time 8    Period Weeks    Status New      PT LONG TERM GOAL #4   Title demonstrate 4/5 bil LE strength to improve safety and ability to stoop/squat and to help with getting up off the floor    Time 8    Period Weeks    Status New      PT LONG TERM GOAL #5   Title 5x sit to stand test (no UE use) in 14.25 sec    Time 8    Period Weeks    Status New      PT LONG TERM GOAL #6   Title No loss of balance with 180 turns 3/3 trials    Time 8    Period Weeks    Status New      PT LONG TERM GOAL #7   Title BERG score improved to 34/56 indicating improved balance and decreased risk of falls    Time 8    Period Weeks    Status New                   Plan - 07/31/21 1644     Clinical Impression Statement The patient reports a emotionally difficult day but reports feeling better at the end of the session.  He is able to participate in components of pushing to aide getting up from the floor if needed.  1 episode of loss of balance backwards with sit to stand with weight.  He has 2 losses  of balance with 90 degrees to the right only, turns to the left with ease.  Therapist providing mod assist to help recover from loss of balance.  He asks about practicing turns at home but recommend doing this in the clinic under PT guidance and assist since he lives alone.    He declines the need for many rest breaks and expresses readiness to "power through".    Comorbidities CKD with dialysis Mon, Wed,Fri; RIGHT ARM FISTULA;  DM;  pacemaker/defibrillator; COPD; HTN; neuropathy    Examination-Activity Limitations Locomotion Level;Transfers;Carry;Lift;Stand;Squat    Examination-Participation Restrictions Cleaning;Community Activity;Driving;Interpersonal Relationship    Rehab Potential Good    PT Frequency 2x / week    PT Duration 8 weeks    PT Treatment/Interventions ADLs/Self Care Home Management;Therapeutic activities;Neuromuscular re-education;Therapeutic exercise;Manual techniques;Patient/family education;Stair training;Gait training;Functional mobility training    PT Next Visit Plan Nu-Step; sit to stands; standing ex's for balance and strength; seated core and LE strengthening; 90 degree turns    PT Home Exercise Plan Access Code [facility]             Patient will benefit from skilled therapeutic intervention in order to improve the following deficits and impairments:  Difficulty walking, Pain, Decreased activity tolerance, Impaired perceived functional ability, Decreased balance, Decreased strength, Decreased mobility  Visit Diagnosis: Muscle weakness (generalized)  Other abnormalities of gait and mobility  Repeated falls     Problem List Patient Active Problem List   Diagnosis Date Noted   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 07/29/2020   Dependence on renal dialysis ([place]) 11/13/2019   Coagulation defect, unspecified ([place]) 05/11/2019   Encounter for immunization 04/25/2019   Thrombocytopenia ([place]) 04/13/2019   Hypokalemia 04/12/2019   Anaphylactic  shock, unspecified, initial encounter 04/10/2019   Anemia in chronic kidney disease 86/76/1950   Complication of vascular dialysis catheter 04/10/2019   Iron deficiency anemia, unspecified 04/10/2019   Secondary hyperparathyroidism of renal origin ([place]) 04/10/2019   Hyperkalemia, diminished renal excretion 04/01/2019   Hyperlipidemia associated with type 2 diabetes mellitus ([place]) 04/13/2018   S/P cholecystectomy 10/13/2017   Diarrhea 05/11/2017   Ruptured appendicitis 03/26/2017   Marital stress 02/18/2017   Trigger finger 06/10/2016   BPH associated with nocturia 04/27/2016   Diabetic peripheral neuropathy (HCC)    COPD (chronic obstructive pulmonary disease) ([place])    Bell's palsy    Gout 93/26/7124   Chronic systolic CHF (congestive heart failure) ([place]) 04/08/2015   Diverticulitis 04/08/2015   DOE (dyspnea on exertion), due to significant CAD 03/28/2015   ESRD on hemodialysis ([place]) 03/04/2015   CKD (chronic kidney disease) stage 4, GFR 15-29 ml/min (HCC) 03/04/2015   CAD (coronary artery disease) 09/13/2014   GERD (gastroesophageal reflux disease) 08/07/2012   Major depression, recurrent, full remission ([place]) 08/05/2012   Sleep apnea 08/05/2012   Controlled type 2 diabetes mellitus with chronic kidney disease on chronic dialysis ([place]) 08/05/2012   Hypertension associated with diabetes ([place]) 08/05/2012   Diabetes type 2, uncontrolled 08/05/2012   Ischemic cardiomyopathy 11/03/2011   Atrioventricular block, complete [facility])    Obesity    Pacemaker    Ruben Im, PT 07/31/21 4:56 PM Phone: [phone] Fax: [phone]  Alvera Singh, PT 07/31/2021, 4:55 PM  [facility] @ [place], [place], [address] Phone: [phone]   Fax:  [phone]  Name: Rodney Farley MRN: [id number] Date of Birth: September 09, 1931

## 2021-08-01 ENCOUNTER — Encounter: Payer: Self-pay | Admitting: Family Medicine

## 2021-08-05 ENCOUNTER — Other Ambulatory Visit: Payer: Self-pay

## 2021-08-05 ENCOUNTER — Encounter: Payer: Self-pay | Admitting: Rehabilitative and Restorative Service Providers"

## 2021-08-05 ENCOUNTER — Ambulatory Visit: Payer: PPO | Admitting: Rehabilitative and Restorative Service Providers"

## 2021-08-05 DIAGNOSIS — R2689 Other abnormalities of gait and mobility: Secondary | ICD-10-CM

## 2021-08-05 DIAGNOSIS — R296 Repeated falls: Secondary | ICD-10-CM

## 2021-08-05 DIAGNOSIS — M6281 Muscle weakness (generalized): Secondary | ICD-10-CM

## 2021-08-05 NOTE — Therapy (Signed)
[facility] @ [place], [place], [address] Phone: [phone]   Fax:  [phone]  Physical Therapy Treatment  Patient Details  Name: Rodney Farley MRN: [id number] Date of Birth: [birth year] Referring Provider (PT): Dr. Garret Reddish   Encounter Date: 08/05/2021   PT End of Session - 08/05/21 1236     Visit Number 5    Date for PT Re-Evaluation 09/11/21    Authorization Type [employer] 10th visit progress note    Progress Note Due on Visit 10    PT Start Time 1226    PT Stop Time 1305    PT Time Calculation (min) 39 min    Activity Tolerance Patient tolerated treatment well    Behavior During Therapy [facility] for tasks assessed/performed             Past Medical History:  Diagnosis Date   AICD (automatic cardioverter/defibrillator) present    AKI (acute kidney injury) ([place]) 03/27/2017   Anemia    Anginal pain ([place])    Anxiety    Arm pain 05/08/2015   LEFT ARM   Arthritis    Atrioventricular block, complete ([place])    a. 2010 s/p pacemaker.   Bell's palsy    left side. after shingles episode   Bilateral renal cysts 07/23/2017   Simple and hemorrhagic noted on CT ab/pelvis    BPH associated with nocturia    Cardiomegaly    Chronic systolic CHF (congestive heart failure) ([place])    EF normalized by Echo 2019   CKD (chronic kidney disease), stage IV (HCC)    COPD (chronic obstructive pulmonary disease) ([place])    Severe   Coronary artery disease    a. s/p MI in 1994/1995 while in [place] s/p questionable PCI. 03/2015: progression of disease, for staged PCI.   Depression    Diabetic peripheral neuropathy (HCC)    Diarrhea    Diverticulitis    DOE (dyspnea on exertion) 03/28/2015   Full dentures    Gallstones    GERD (gastroesophageal reflux disease)    Gout    Hard of hearing    B/L   Heart murmur    History of chronic pancreatitis 07/23/2017   noted on CT abd/pelvis    History of shingles    Hypercholesterolemia    Hypertension    Ischemic cardiomyopathy    MI (myocardial infarction) ([place]) 1994; 1995   Neuropathy    IN LOWER EXTREMITIES   Nosebleed 10/06/2017   for 2 months most recent 10/06/2017   Obesity    Pacemaker    medtronic>>> MDT ICD 09/23/15   Ruptured appendicitis    Sleep apnea    "sleeps w/humidifyer when he panics and gets short of breath" (04/08/2015)   TIA (transient ischemic attack) X 3   Trigger middle finger of left hand    Type II diabetes mellitus ([place])    Wears glasses    Wears hearing aid     Past Surgical History:  Procedure Laterality Date   APPENDECTOMY     AV FISTULA PLACEMENT Right 02/07/2019   Procedure: ARTERIOVENOUS (AV) FISTULA CREATION RIGHT UPPER ARM;  Surgeon: Waynetta Sandy, MD;  Location: [facility];  Service: Vascular;  Laterality: Right;   CARDIAC CATHETERIZATION N/A 03/29/2015   Procedure: Right/Left Heart Cath and Coronary Angiography;  Surgeon: Hurley M [name], MD;  Location: [facility];  Service: Cardiovascular;  Laterality: N/A;   CARDIAC CATHETERIZATION  1995   "  after my MI; put me on heart RX after cath"   CARDIAC CATHETERIZATION N/A 04/09/2015   Procedure: Coronary Stent Intervention;  Surgeon: Oluwatobi M [name], MD;  Location: [facility];  Service: Cardiovascular;  Laterality: N/A;   CATARACT EXTRACTION W/ INTRAOCULAR LENS  IMPLANT, BILATERAL     CHOLECYSTECTOMY N/A 10/13/2017   Procedure: LAPAROSCOPIC CHOLECYSTECTOMY WITH LYSIS OF ADHESIONS;  Surgeon: Ileana Roup, MD;  [facility];  Service: General;  Laterality: N/A;   COLONOSCOPY     DENTAL SURGERY     EP IMPLANTABLE DEVICE N/A 09/23/2015   MDT CRT-D, Dr. Caryl Comes   HIATAL HERNIA REPAIR  1977   ILEOCECETOMY N/A 03/27/2017   Procedure: ILEOCECECTOMY;  Surgeon: Ileana Roup, MD;  Location: [facility];  Service: General;  Laterality: N/A;   INSERT / REPLACE / REMOVE PACEMAKER  07/2008   Complete heart block status post  DDD with good function   IR FLUORO GUIDE CV LINE RIGHT  04/03/2019   IR US GUIDE [place] RIGHT  04/03/2019   LAPAROTOMY N/A 03/27/2017   Procedure: EXPLORATORY LAPAROTOMY;  Surgeon: Ileana Roup, MD;  Location: [facility];  Service: General;  Laterality: N/A;   TONSILLECTOMY     UPPER GI ENDOSCOPY      There were no vitals filed for this visit.   Subjective Assessment - 08/05/21 1235     Subjective Pt states that he is still mourning his wife's death.  Pt with no new physical complaints.    Pertinent History dialysis Mon, Wed, Fri;  pacemaker with defibrillator; DN, COPD, HTN; DM; neuropathy;  RIGHT ARM FISTULA    Patient Stated Goals I hope we might proceed with practice to get up off the floor and gain strength in my leg; stoop to clean oven/stove    Currently in Pain? No/denies                               [facility] Adult PT Treatment/Exercise - 08/05/21 0001       Knee/Hip Exercises: Aerobic   Nustep L3 8 min while discussing status      Knee/Hip Exercises: Standing   Other Standing Knee Exercises 5# kettlebell partial dead lifts 2x 10    Other Standing Knee Exercises Alt LE cone toe tap 2x5 B with min A.      Knee/Hip Exercises: Seated   Other Seated Knee/Hip Exercises unilateral arm side push ups on mat table 2x10 B    Other Seated Knee/Hip Exercises 5# chair sit up 10x    Sit to Sand 2 sets;5 reps   holding 5# weight     Shoulder Exercises: Seated   Row Strengthening;Both;20 reps;Theraband    Theraband Level (Shoulder Row) Level 2 (Red)    Horizontal ABduction Strengthening;Both;20 reps;Theraband    Theraband Level (Shoulder Horizontal ABduction) Level 2 (Red)                       PT Short Term Goals - 08/05/21 1330       PT SHORT TERM GOAL #1   Title be independent in initial HEP    Status Achieved      PT SHORT TERM GOAL #2   Title perform TUG in < or = to 27 seconds to improve mobility and improve balance    Status  On-going      PT SHORT TERM GOAL #3   Title improve  LE strength to perform sit to stand 5x in 20 sec    Status On-going      PT SHORT TERM GOAL #4   Title BERG score improved to 28/56 indicating improved balance and safety    Status On-going               PT Long Term Goals - 07/17/21 1851       PT LONG TERM GOAL #1   Title be independent in advanced HEP    Time 8    Period Weeks    Status New    Target Date 09/11/21      PT LONG TERM GOAL #2   Title perform TUG in < or = to 22 seconds to improve mobility and reduce falls risk    Time 8    Period Weeks    Status New      PT LONG TERM GOAL #3   Title Improved LE strength to walk 475 feet in 3 minutes    Time 8    Period Weeks    Status New      PT LONG TERM GOAL #4   Title demonstrate 4/5 bil LE strength to improve safety and ability to stoop/squat and to help with getting up off the floor    Time 8    Period Weeks    Status New      PT LONG TERM GOAL #5   Title 5x sit to stand test (no UE use) in 14.25 sec    Time 8    Period Weeks    Status New      PT LONG TERM GOAL #6   Title No loss of balance with 180 turns 3/3 trials    Time 8    Period Weeks    Status New      PT LONG TERM GOAL #7   Title BERG score improved to 34/56 indicating improved balance and decreased risk of falls    Time 8    Period Weeks    Status New                   Plan - 08/05/21 1313     Clinical Impression Statement Mr Kneisel tolerated session well.  He states that his Nephrologist told him that he can weight bear through his right upper extremity as he can easily tolerate without feeling strain/pain. Pt with difficulty with single leg tasks, such as alt toe tap on cone and required min A to maintain balance. Pt requires limited seated recovery periods and with limited fatigue following higher level standing exercises. Pt continues to require skilled PT to progress towards goal related activities.    PT  Treatment/Interventions ADLs/Self Care Home Management;Therapeutic activities;Neuromuscular re-education;Therapeutic exercise;Manual techniques;Patient/family education;Stair training;Gait training;Functional mobility training    PT Next Visit Plan Nu-Step; sit to stands; standing ex's for balance and strength; seated core and LE strengthening; 90 degree turns    Consulted and Agree with Plan of Care Patient             Patient will benefit from skilled therapeutic intervention in order to improve the following deficits and impairments:  Difficulty walking, Pain, Decreased activity tolerance, Impaired perceived functional ability, Decreased balance, Decreased strength, Decreased mobility  Visit Diagnosis: Muscle weakness (generalized)  Other abnormalities of gait and mobility  Repeated falls     Problem List Patient Active Problem List   Diagnosis Date Noted   ICD (implantable cardioverter-defibrillator) in place -  CRT 07/29/2020   Dependence on renal dialysis ([place]) 11/13/2019   Coagulation defect, unspecified ([place]) 05/11/2019   Encounter for immunization 04/25/2019   Thrombocytopenia ([place]) 04/13/2019   Hypokalemia 04/12/2019   Anaphylactic shock, unspecified, initial encounter 04/10/2019   Anemia in chronic kidney disease 44/62/8638   Complication of vascular dialysis catheter 04/10/2019   Iron deficiency anemia, unspecified 04/10/2019   Secondary hyperparathyroidism of renal origin ([place]) 04/10/2019   Hyperkalemia, diminished renal excretion 04/01/2019   Hyperlipidemia associated with type 2 diabetes mellitus ([place]) 04/13/2018   S/P cholecystectomy 10/13/2017   Diarrhea 05/11/2017   Ruptured appendicitis 03/26/2017   Marital stress 02/18/2017   Trigger finger 06/10/2016   BPH associated with nocturia 04/27/2016   Diabetic peripheral neuropathy (HCC)    COPD (chronic obstructive pulmonary disease) ([name])    Bell's palsy    Gout 17/71/1657   Chronic systolic CHF  (congestive heart failure) ([place]) 04/08/2015   Diverticulitis 04/08/2015   DOE (dyspnea on exertion), due to significant CAD 03/28/2015   ESRD on hemodialysis ([place]) 03/04/2015   CKD (chronic kidney disease) stage 4, GFR 15-29 ml/min (HCC) 03/04/2015   CAD (coronary artery disease) 09/13/2014   GERD (gastroesophageal reflux disease) 08/07/2012   Major depression, recurrent, full remission ([place]) 08/05/2012   Sleep apnea 08/05/2012   Controlled type 2 diabetes mellitus with chronic kidney disease on chronic dialysis ([place]) 08/05/2012   Hypertension associated with diabetes ([place]) 08/05/2012   Diabetes type 2, uncontrolled 08/05/2012   Ischemic cardiomyopathy 11/03/2011   Atrioventricular block, complete [facility])    Obesity    Pacemaker     Juel Burrow, PT, DPT 08/05/2021, 1:31 PM  [facility] @ [place], [place], [address] Phone: [phone]   Fax:  [phone]  Name: Lyndall Windt MRN: [id number] Date of Birth: Mar 26, 1932

## 2021-08-07 ENCOUNTER — Other Ambulatory Visit: Payer: Self-pay

## 2021-08-07 ENCOUNTER — Ambulatory Visit: Payer: PPO | Admitting: Physical Therapy

## 2021-08-07 DIAGNOSIS — R296 Repeated falls: Secondary | ICD-10-CM

## 2021-08-07 DIAGNOSIS — M6281 Muscle weakness (generalized): Secondary | ICD-10-CM

## 2021-08-07 DIAGNOSIS — R2689 Other abnormalities of gait and mobility: Secondary | ICD-10-CM

## 2021-08-07 NOTE — Progress Notes (Signed)
Monthly battery check is scheduled.

## 2021-08-07 NOTE — Therapy (Signed)
[facility] @ [address] [place], [place], [address] Phone: [phone]   Fax:  [phone]  Physical Therapy Treatment  Patient Details  Name: Rodney Farley MRN: [id number] Date of Birth: 07/21/1931 Referring Provider (PT): Dr. Garret Reddish   Encounter Date: 08/07/2021   PT End of Session - 08/07/21 1243     Visit Number 6    Date for PT Re-Evaluation 09/11/21    Authorization Type [employer] 10th visit progress note    Progress Note Due on Visit 10    PT Start Time 1100    PT Stop Time 1141    PT Time Calculation (min) 41 min    Activity Tolerance Patient tolerated treatment well             Past Medical History:  Diagnosis Date   AICD (automatic cardioverter/defibrillator) present    AKI (acute kidney injury) ([place]) 03/27/2017   Anemia    Anginal pain ([place])    Anxiety    Arm pain 05/08/2015   LEFT ARM   Arthritis    Atrioventricular block, complete ([place])    a. 2010 s/p pacemaker.   Bell's palsy    left side. after shingles episode   Bilateral renal cysts 07/23/2017   Simple and hemorrhagic noted on CT ab/pelvis    BPH associated with nocturia    Cardiomegaly    Chronic systolic CHF (congestive heart failure) ([place])    EF normalized by Echo 2019   CKD (chronic kidney disease), stage IV (HCC)    COPD (chronic obstructive pulmonary disease) ([place])    Severe   Coronary artery disease    a. s/p MI in 1994/1995 while in [place] s/p questionable PCI. 03/2015: progression of disease, for staged PCI.   Depression    Diabetic peripheral neuropathy (HCC)    Diarrhea    Diverticulitis    DOE (dyspnea on exertion) 03/28/2015   Full dentures    Gallstones    GERD (gastroesophageal reflux disease)    Gout    Hard of hearing    B/L   Heart murmur    History of chronic pancreatitis 07/23/2017   noted on CT abd/pelvis   History of shingles    Hypercholesterolemia    Hypertension     Ischemic cardiomyopathy    MI (myocardial infarction) ([place]) 1994; 1995   Neuropathy    IN LOWER EXTREMITIES   Nosebleed 10/06/2017   for 2 months most recent 10/06/2017   Obesity    Pacemaker    medtronic>>> MDT ICD 09/23/15   Ruptured appendicitis    Sleep apnea    "sleeps w/humidifyer when he panics and gets short of breath" (04/08/2015)   TIA (transient ischemic attack) X 3   Trigger middle finger of left hand    Type II diabetes mellitus ([place])    Wears glasses    Wears hearing aid     Past Surgical History:  Procedure Laterality Date   APPENDECTOMY     AV FISTULA PLACEMENT Right 02/07/2019   Procedure: ARTERIOVENOUS (AV) FISTULA CREATION RIGHT UPPER ARM;  Surgeon: Waynetta Sandy, MD;  Location: [facility];  Service: Vascular;  Laterality: Right;   CARDIAC CATHETERIZATION N/A 03/29/2015   Procedure: Right/Left Heart Cath and Coronary Angiography;  Surgeon: Junaid M [name], MD;  Location: [facility];  Service: Cardiovascular;  Laterality: N/A;   [place]   "after my MI; put me on heart RX after cath"  CARDIAC CATHETERIZATION N/A 04/09/2015   Procedure: Coronary Stent Intervention;  Surgeon: Aniken M [name], MD;  Location: [facility];  Service: Cardiovascular;  Laterality: N/A;   CATARACT EXTRACTION W/ INTRAOCULAR LENS  IMPLANT, BILATERAL     CHOLECYSTECTOMY N/A 10/13/2017   Procedure: LAPAROSCOPIC CHOLECYSTECTOMY WITH LYSIS OF ADHESIONS;  Surgeon: Ileana Roup, MD;  [facility];  Service: General;  Laterality: N/A;   COLONOSCOPY     DENTAL SURGERY     EP IMPLANTABLE DEVICE N/A 09/23/2015   MDT CRT-D, Dr. Caryl Comes   HIATAL HERNIA REPAIR  1977   ILEOCECETOMY N/A 03/27/2017   Procedure: ILEOCECECTOMY;  Surgeon: Ileana Roup, MD;  Location: [facility];  Service: General;  Laterality: N/A;   INSERT / REPLACE / REMOVE PACEMAKER  07/2008   Complete heart block status post DDD with good function   IR FLUORO GUIDE CV LINE RIGHT   04/03/2019   IR US GUIDE Grandfather RIGHT  04/03/2019   LAPAROTOMY N/A 03/27/2017   Procedure: EXPLORATORY LAPAROTOMY;  Surgeon: Ileana Roup, MD;  Location: [facility];  Service: General;  Laterality: N/A;   TONSILLECTOMY     UPPER GI ENDOSCOPY      There were no vitals filed for this visit.   Subjective Assessment - 08/07/21 1100     Subjective I haven't cried over my wife's death since Nov 01, 2022.  I had a spontaneous nose bleed this morning.  I took my BP and it was OK.    Pertinent History dialysis Mon, Wed, Fri;  pacemaker with defibrillator; DN, COPD, HTN; DM; neuropathy;  RIGHT ARM FISTULA    Currently in Pain? No/denies    Pain Score 0-No pain                               [facility] Adult PT Treatment/Exercise - 08/07/21 0001       Lumbar Exercises: Standing   Other Standing Lumbar Exercises red band bil shoulder extensions 10x    Other Standing Lumbar Exercises 10# kettlebell dead lift 2x5      Lumbar Exercises: Seated   Other Seated Lumbar Exercises seated sit ups 10# 10x3      Knee/Hip Exercises: Aerobic   Nustep L3 8 min while discussing status   98 spm average     Knee/Hip Exercises: Standing   Forward Step Up Right;Left;Hand Hold: 2;Step Height: 4"    Forward Step Up Limitations 3 reps each side    Other Standing Knee Exercises --    Other Standing Knee Exercises wall push ups 10x      Knee/Hip Exercises: Seated   Other Seated Knee/Hip Exercises 10# hip flexion/abduction to touch pod 2x10 each side    Sit to Sand 1 set;5 reps;without UE support      Shoulder Exercises: Seated   Other Seated Exercises red band forward punches (more resist given on left) 10x each                       PT Short Term Goals - 08/05/21 1330       PT SHORT TERM GOAL #1   Title be independent in initial HEP    Status Achieved      PT SHORT TERM GOAL #2   Title perform TUG in < or = to 27 seconds to improve mobility and improve balance     Status On-going  PT SHORT TERM GOAL #3   Title improve LE strength to perform sit to stand 5x in 20 sec    Status On-going      PT SHORT TERM GOAL #4   Title BERG score improved to 28/56 indicating improved balance and safety    Status On-going               PT Long Term Goals - 07/17/21 1851       PT LONG TERM GOAL #1   Title be independent in advanced HEP    Time 8    Period Weeks    Status New    Target Date 09/11/21      PT LONG TERM GOAL #2   Title perform TUG in < or = to 22 seconds to improve mobility and reduce falls risk    Time 8    Period Weeks    Status New      PT LONG TERM GOAL #3   Title Improved LE strength to walk 475 feet in 3 minutes    Time 8    Period Weeks    Status New      PT LONG TERM GOAL #4   Title demonstrate 4/5 bil LE strength to improve safety and ability to stoop/squat and to help with getting up off the floor    Time 8    Period Weeks    Status New      PT LONG TERM GOAL #5   Title 5x sit to stand test (no UE use) in 14.25 sec    Time 8    Period Weeks    Status New      PT LONG TERM GOAL #6   Title No loss of balance with 180 turns 3/3 trials    Time 8    Period Weeks    Status New      PT LONG TERM GOAL #7   Title BERG score improved to 34/56 indicating improved balance and decreased risk of falls    Time 8    Period Weeks    Status New                   Plan - 08/07/21 1243     Clinical Impression Statement The patient is able to perform of circuit of exercises today: 3 rounds of 4-5 ex's each with 3 min rest break between each round.  He is able to rise from a chair with ease today and performs dead lifts without loss of balance.  Initiated forward step ups which have been very challenging in the past.  He was able to do limited reps with bil UE use to assist.  Therapist providing supervision and at times CGA for safety but no physical assist needed to help him recover balance today.    Comorbidities  CKD with dialysis Mon, Wed,Fri; RIGHT ARM FISTULA;  DM;  pacemaker/defibrillator; COPD; HTN; neuropathy    Examination-Participation Restrictions Cleaning;Community Activity;Driving;Interpersonal Relationship    Rehab Potential Good    PT Frequency 2x / week    PT Duration 8 weeks    PT Treatment/Interventions ADLs/Self Care Home Management;Therapeutic activities;Neuromuscular re-education;Therapeutic exercise;Manual techniques;Patient/family education;Stair training;Gait training;Functional mobility training    PT Next Visit Plan Nu-Step; sit to stands; standing ex's for balance and strength; seated core and LE strengthening; 90 degree turns;  step ups; circuit; recheck TUG, BERG and 5x sit to stand next week    PT [place]  Patient will benefit from skilled therapeutic intervention in order to improve the following deficits and impairments:  Difficulty walking, Pain, Decreased activity tolerance, Impaired perceived functional ability, Decreased balance, Decreased strength, Decreased mobility  Visit Diagnosis: Muscle weakness (generalized)  Other abnormalities of gait and mobility  Repeated falls     Problem List Patient Active Problem List   Diagnosis Date Noted   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 07/29/2020   Dependence on renal dialysis ([place]) 11/13/2019   Coagulation defect, unspecified ([place]) 05/11/2019   Encounter for immunization 04/25/2019   Thrombocytopenia ([place]) 04/13/2019   Hypokalemia 04/12/2019   Anaphylactic shock, unspecified, initial encounter 04/10/2019   Anemia in chronic kidney disease 54/65/0354   Complication of vascular dialysis catheter 04/10/2019   Iron deficiency anemia, unspecified 04/10/2019   Secondary hyperparathyroidism of renal origin ([place]) 04/10/2019   Hyperkalemia, diminished renal excretion 04/01/2019   Hyperlipidemia associated with type 2 diabetes mellitus ([place]) 04/13/2018    S/P cholecystectomy 10/13/2017   Diarrhea 05/11/2017   Ruptured appendicitis 03/26/2017   Marital stress 02/18/2017   Trigger finger 06/10/2016   BPH associated with nocturia 04/27/2016   Diabetic peripheral neuropathy (HCC)    COPD (chronic obstructive pulmonary disease) ([place])    Bell's palsy    Gout 65/68/1275   Chronic systolic CHF (congestive heart failure) ([place]) 04/08/2015   Diverticulitis 04/08/2015   DOE (dyspnea on exertion), due to significant CAD 03/28/2015   ESRD on hemodialysis ([place]) 03/04/2015   CKD (chronic kidney disease) stage 4, GFR 15-29 ml/min (HCC) 03/04/2015   CAD (coronary artery disease) 09/13/2014   GERD (gastroesophageal reflux disease) 08/07/2012   Major depression, recurrent, full remission ([place]) 08/05/2012   Sleep apnea 08/05/2012   Controlled type 2 diabetes mellitus with chronic kidney disease on chronic dialysis ([place]) 08/05/2012   Hypertension associated with diabetes ([place]) 08/05/2012   Diabetes type 2, uncontrolled 08/05/2012   Ischemic cardiomyopathy 11/03/2011   Atrioventricular block, complete [facility])    Obesity    Pacemaker    Ruben Im, PT 08/07/21 12:49 PM Phone: [phone] Fax: [phone]  Alvera Singh, PT 08/07/2021, 12:49 PM  [facility] @ [address] [place], [place], [address] Phone: [phone]   Fax:  [phone]  Name: Tsutomu Barfoot MRN: [id number] Date of Birth: Feb 11, 1932

## 2021-08-12 ENCOUNTER — Ambulatory Visit: Payer: PPO | Admitting: Podiatry

## 2021-08-12 ENCOUNTER — Other Ambulatory Visit: Payer: Self-pay

## 2021-08-12 ENCOUNTER — Ambulatory Visit: Payer: PPO | Admitting: Physical Therapy

## 2021-08-12 DIAGNOSIS — M6281 Muscle weakness (generalized): Secondary | ICD-10-CM | POA: Diagnosis not present

## 2021-08-12 DIAGNOSIS — R296 Repeated falls: Secondary | ICD-10-CM

## 2021-08-12 DIAGNOSIS — E1122 Type 2 diabetes mellitus with diabetic chronic kidney disease: Secondary | ICD-10-CM | POA: Diagnosis not present

## 2021-08-12 DIAGNOSIS — R2689 Other abnormalities of gait and mobility: Secondary | ICD-10-CM

## 2021-08-12 DIAGNOSIS — N186 End stage renal disease: Secondary | ICD-10-CM

## 2021-08-12 DIAGNOSIS — L84 Corns and callosities: Secondary | ICD-10-CM | POA: Diagnosis not present

## 2021-08-12 DIAGNOSIS — B351 Tinea unguium: Secondary | ICD-10-CM | POA: Diagnosis not present

## 2021-08-12 NOTE — Therapy (Signed)
[facility] @ [address] [place], [place], [address] Phone: [phone]   Fax:  [phone]  Physical Therapy Treatment  Patient Details  Name: Rodney Farley MRN: [id number] Date of Birth: 15-Jan-1932 Referring Provider (PT): Dr. Garret Reddish   Encounter Date: 08/12/2021   PT End of Session - 08/12/21 1751     Visit Number 7    Date for PT Re-Evaluation 09/11/21    Authorization Type [employer] 10th visit progress note    Progress Note Due on Visit 10    PT Start Time 0845    PT Stop Time 0925    PT Time Calculation (min) 40 min    Activity Tolerance Patient tolerated treatment well             Past Medical History:  Diagnosis Date   AICD (automatic cardioverter/defibrillator) present    AKI (acute kidney injury) ([place]) 03/27/2017   Anemia    Anginal pain ([place])    Anxiety    Arm pain 05/08/2015   LEFT ARM   Arthritis    Atrioventricular block, complete ([place])    a. 2010 s/p pacemaker.   Bell's palsy    left side. after shingles episode   Bilateral renal cysts 07/23/2017   Simple and hemorrhagic noted on CT ab/pelvis    BPH associated with nocturia    Cardiomegaly    Chronic systolic CHF (congestive heart failure) ([place])    EF normalized by Echo 2019   CKD (chronic kidney disease), stage IV (HCC)    COPD (chronic obstructive pulmonary disease) ([place])    Severe   Coronary artery disease    a. s/p MI in 1994/1995 while in [place] s/p questionable PCI. 03/2015: progression of disease, for staged PCI.   Depression    Diabetic peripheral neuropathy (HCC)    Diarrhea    Diverticulitis    DOE (dyspnea on exertion) 03/28/2015   Full dentures    Gallstones    GERD (gastroesophageal reflux disease)    Gout    Hard of hearing    B/L   Heart murmur    History of chronic pancreatitis 07/23/2017   noted on CT abd/pelvis   History of shingles    Hypercholesterolemia    Hypertension     Ischemic cardiomyopathy    MI (myocardial infarction) ([place]) 1994; 1995   Neuropathy    IN LOWER EXTREMITIES   Nosebleed 10/06/2017   for 2 months most recent 10/06/2017   Obesity    Pacemaker    medtronic>>> MDT ICD 09/23/15   Ruptured appendicitis    Sleep apnea    "sleeps w/humidifyer when he panics and gets short of breath" (04/08/2015)   TIA (transient ischemic attack) X 3   Trigger middle finger of left hand    Type II diabetes mellitus ([place])    Wears glasses    Wears hearing aid     Past Surgical History:  Procedure Laterality Date   APPENDECTOMY     AV FISTULA PLACEMENT Right 02/07/2019   Procedure: ARTERIOVENOUS (AV) FISTULA CREATION RIGHT UPPER ARM;  Surgeon: Waynetta Sandy, MD;  Location: [facility];  Service: Vascular;  Laterality: Right;   CARDIAC CATHETERIZATION N/A 03/29/2015   Procedure: Right/Left Heart Cath and Coronary Angiography;  Surgeon: Tahji M [name], MD;  Location: [facility];  Service: Cardiovascular;  Laterality: N/A;   [place]   "after my MI; put me on heart RX after cath"  CARDIAC CATHETERIZATION N/A 04/09/2015   Procedure: Coronary Stent Intervention;  Surgeon: Susie M [name], MD;  Location: [facility];  Service: Cardiovascular;  Laterality: N/A;   CATARACT EXTRACTION W/ INTRAOCULAR LENS  IMPLANT, BILATERAL     CHOLECYSTECTOMY N/A 10/13/2017   Procedure: LAPAROSCOPIC CHOLECYSTECTOMY WITH LYSIS OF ADHESIONS;  Surgeon: Ileana Roup, MD;  [facility];  Service: General;  Laterality: N/A;   COLONOSCOPY     DENTAL SURGERY     EP IMPLANTABLE DEVICE N/A 09/23/2015   MDT CRT-D, Dr. Caryl Comes   HIATAL HERNIA REPAIR  1977   ILEOCECETOMY N/A 03/27/2017   Procedure: ILEOCECECTOMY;  Surgeon: Ileana Roup, MD;  Location: [facility];  Service: General;  Laterality: N/A;   INSERT / REPLACE / REMOVE PACEMAKER  07/2008   Complete heart block status post DDD with good function   IR FLUORO GUIDE CV LINE RIGHT   04/03/2019   IR US GUIDE [facility] RIGHT  04/03/2019   LAPAROTOMY N/A 03/27/2017   Procedure: EXPLORATORY LAPAROTOMY;  Surgeon: Ileana Roup, MD;  Location: [facility];  Service: General;  Laterality: N/A;   TONSILLECTOMY     UPPER GI ENDOSCOPY      There were no vitals filed for this visit.   Subjective Assessment - 08/12/21 0853     Subjective Had some soreness in right fistula.  Had some pain there during dialysis and they said not to worry about it.  I really want to work on getting up from the floor.    Pertinent History dialysis Mon, Wed, Fri;  pacemaker with defibrillator; DN, COPD, HTN; DM; neuropathy;  RIGHT ARM FISTULA    Currently in Pain? No/denies    Pain Score 0-No pain                               [facility] Adult PT Treatment/Exercise - 08/12/21 0001       Lumbar Exercises: Standing   Other Standing Lumbar Exercises blue band bil shoulder extensions 10x    Other Standing Lumbar Exercises 10# kettlebell dead lift 10x      Lumbar Exercises: Seated   Other Seated Lumbar Exercises seated sit ups 10# 10x2      Knee/Hip Exercises: Aerobic   Nustep L3 8 min while discussing status   98 spm average     Knee/Hip Exercises: Standing   Forward Step Up Right;Left;Hand Hold: 2;Step Height: 4"    Other Standing Knee Exercises wall push ups 10x      Knee/Hip Exercises: Seated   Other Seated Knee/Hip Exercises 10# hip flexion/abduction to touch pod 2x10 each side    Other Seated Knee/Hip Exercises 10# heel raise 10x each side    Sit to Sand 1 set;5 reps;without UE support                       PT Short Term Goals - 08/05/21 1330       PT SHORT TERM GOAL #1   Title be independent in initial HEP    Status Achieved      PT SHORT TERM GOAL #2   Title perform TUG in < or = to 27 seconds to improve mobility and improve balance    Status On-going      PT SHORT TERM GOAL #3   Title improve LE strength to perform sit to stand 5x in 20 sec     Status  On-going      PT SHORT TERM GOAL #4   Title BERG score improved to 28/56 indicating improved balance and safety    Status On-going               PT Long Term Goals - 07/17/21 1851       PT LONG TERM GOAL #1   Title be independent in advanced HEP    Time 8    Period Weeks    Status New    Target Date 09/11/21      PT LONG TERM GOAL #2   Title perform TUG in < or = to 22 seconds to improve mobility and reduce falls risk    Time 8    Period Weeks    Status New      PT LONG TERM GOAL #3   Title Improved LE strength to walk 475 feet in 3 minutes    Time 8    Period Weeks    Status New      PT LONG TERM GOAL #4   Title demonstrate 4/5 bil LE strength to improve safety and ability to stoop/squat and to help with getting up off the floor    Time 8    Period Weeks    Status New      PT LONG TERM GOAL #5   Title 5x sit to stand test (no UE use) in 14.25 sec    Time 8    Period Weeks    Status New      PT LONG TERM GOAL #6   Title No loss of balance with 180 turns 3/3 trials    Time 8    Period Weeks    Status New      PT LONG TERM GOAL #7   Title BERG score improved to 34/56 indicating improved balance and decreased risk of falls    Time 8    Period Weeks    Status New                   Plan - 08/12/21 1753     Clinical Impression Statement The patient is able to continue with strengthening and aerobic circuit style ex's 2 rounds today.  Overall his stability is very good but he does have 1 episode of loss of balance in the backwards direction requiring mod assist from therapist to recover.  He reports general fatigue at an appropriate level based on ex intensity.  He is interested in practicing floor transfers but caution needs to be taken to avoid stressing right UE fistula.  Will try components of floor transfers like quadruped.    Comorbidities CKD with dialysis Mon, Wed,Fri; RIGHT ARM FISTULA;  DM;  pacemaker/defibrillator; COPD; HTN;  neuropathy    Examination-Activity Limitations Locomotion Level;Transfers;Carry;Lift;Stand;Squat    Rehab Potential Good    PT Frequency 2x / week    PT Duration 8 weeks    PT Treatment/Interventions ADLs/Self Care Home Management;Therapeutic activities;Neuromuscular re-education;Therapeutic exercise;Manual techniques;Patient/family education;Stair training;Gait training;Functional mobility training    PT Next Visit Plan Nu-Step; sit to stands; standing ex's for balance and strength; seated core and LE strengthening; 90 degree turns;  step ups; circuit; recheck TUG, BERG and 5x sit to stand; try quadruped crawling on/off mat table    PT Home Exercise Plan Access Code [facility]             Patient will benefit from skilled therapeutic intervention in order to improve the following deficits and impairments:  Difficulty walking, Pain, Decreased activity tolerance, Impaired perceived functional ability, Decreased balance, Decreased strength, Decreased mobility  Visit Diagnosis: Muscle weakness (generalized)  Other abnormalities of gait and mobility  Repeated falls     Problem List Patient Active Problem List   Diagnosis Date Noted   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 07/29/2020   Dependence on renal dialysis ([place]) 11/13/2019   Coagulation defect, unspecified ([place]) 05/11/2019   Encounter for immunization 04/25/2019   Thrombocytopenia ([place]) 04/13/2019   Hypokalemia 04/12/2019   Anaphylactic shock, unspecified, initial encounter 04/10/2019   Anemia in chronic kidney disease 96/28/3662   Complication of vascular dialysis catheter 04/10/2019   Iron deficiency anemia, unspecified 04/10/2019   Secondary hyperparathyroidism of renal origin ([place]) 04/10/2019   Hyperkalemia, diminished renal excretion 04/01/2019   Hyperlipidemia associated with type 2 diabetes mellitus ([place]) 04/13/2018   S/P cholecystectomy 10/13/2017   Diarrhea 05/11/2017   Ruptured appendicitis  03/26/2017   Marital stress 02/18/2017   Trigger finger 06/10/2016   BPH associated with nocturia 04/27/2016   Diabetic peripheral neuropathy (HCC)    COPD (chronic obstructive pulmonary disease) ([place])    Bell's palsy    Gout 94/76/5465   Chronic systolic CHF (congestive heart failure) ([place]) 04/08/2015   Diverticulitis 04/08/2015   DOE (dyspnea on exertion), due to significant CAD 03/28/2015   ESRD on hemodialysis ([place]) 03/04/2015   CKD (chronic kidney disease) stage 4, GFR 15-29 ml/min (HCC) 03/04/2015   CAD (coronary artery disease) 09/13/2014   GERD (gastroesophageal reflux disease) 08/07/2012   Major depression, recurrent, full remission ([place]) 08/05/2012   Sleep apnea 08/05/2012   Controlled type 2 diabetes mellitus with chronic kidney disease on chronic dialysis ([place]) 08/05/2012   Hypertension associated with diabetes ([place]) 08/05/2012   Diabetes type 2, uncontrolled 08/05/2012   Ischemic cardiomyopathy 11/03/2011   Atrioventricular block, complete [facility])    Obesity    Pacemaker    Ruben Im, PT 08/12/21 6:00 PM Phone: [phone] Fax: [phone]  Alvera Singh, PT 08/12/2021, 6:00 PM  [facility] @ [address] [place] [place], [place], [address] Phone: [phone]   Fax:  [phone]  Name: Rodney Farley MRN: [id number] Date of Birth: 1931-11-19

## 2021-08-13 NOTE — Progress Notes (Signed)
°  Subjective:  Patient ID: Rodney Farley, male    DOB: 27-Apr-1932,  MRN: [id number]  Chief Complaint  Patient presents with   Routine foot care    Diabetic foot exam    Callouses    Requesting debridement     88 y.o. male presents with the above complaint. History confirmed with patient.  Denies new pedal issues.  Last AM Blood Sugar Reading: has not been doing it. Last A1c: 5.7 PCP: [name], MD; last seen unsure  Objective:  Physical Exam: warm, good capillary refill, nail exam onychomycosis of the toenails, no trophic changes or ulcerative lesions. DP pulses palpable, PT pulses palpable, and protective sensation absent Right submet 1 callus without open ulceration noted upon debridement  Hemoglobin A1C  Date Value Ref Range Status  08/07/2020 6.3  Final    No images are attached to the encounter.  Assessment:   1. Onychomycosis   2. DM type 2 causing ESRD ([place])   3. Callus    Plan:  Patient was evaluated and treated and all questions answered.  Diabetes with DPN, Onychomycosis  -Nails x10 debrided sharply and manually with large nail nipper and rotary burr. -Ulcer remains healed to the right foot  Procedure: Nail Debridement Type of Debridement: manual, sharp debridement. Instrumentation: Nail nipper, rotary burr. Number of Nails: 10 Disposition: Patient tolerated well without iatrogenic injury.  Procedure: Paring of Lesion Rationale: painful hyperkeratotic lesion Type of Debridement: manual, sharp debridement. Instrumentation: 312 blade Number of Lesions: 1   No follow-ups on file.

## 2021-08-14 ENCOUNTER — Telehealth: Payer: Self-pay

## 2021-08-14 ENCOUNTER — Ambulatory Visit: Payer: PPO | Attending: Family Medicine | Admitting: Physical Therapy

## 2021-08-14 ENCOUNTER — Other Ambulatory Visit: Payer: Self-pay

## 2021-08-14 DIAGNOSIS — R296 Repeated falls: Secondary | ICD-10-CM | POA: Diagnosis present

## 2021-08-14 DIAGNOSIS — M6281 Muscle weakness (generalized): Secondary | ICD-10-CM | POA: Diagnosis not present

## 2021-08-14 DIAGNOSIS — R2689 Other abnormalities of gait and mobility: Secondary | ICD-10-CM | POA: Diagnosis present

## 2021-08-14 NOTE — Telephone Encounter (Signed)
Alert remote reviewed. Normal device function.   The device has reached the elective replacement indicator today, sent to triage. Next remote 08/18/2021.  Unsuccessful telephone encounter to patient's son [name] (on [place]) to discuss device RRT status. Per DPR able to leave detailed message on Paul's cell. Of note son has previously requested appointment with Dr. Caryl Comes to discuss replacement as patient was asked by Dr. Caryl Comes 6/21 if he wished to continue ICD therapy at gen change. Appointment is scheduled for 2/14. Detailed message left on son's cell reminding of appointment and of RRT status. Provided device clinic contact for additional questions or concerns. Will route as FYI

## 2021-08-14 NOTE — Therapy (Signed)
[facility] @ [place], [place], [address] Phone: [phone]   Fax:  [phone]  Physical Therapy Treatment  Patient Details  Name: Collin Rengel MRN: [id number] Date of Birth: 07/04/1932 Referring Provider (PT): Dr. Garret Reddish   Encounter Date: 08/14/2021   PT End of Session - 08/14/21 1145     Visit Number 8    Date for PT Re-Evaluation 09/11/21    Authorization Type [employer] 10th visit progress note    Progress Note Due on Visit 10    PT Start Time 1144    PT Stop Time 1225    PT Time Calculation (min) 41 min    Activity Tolerance Patient tolerated treatment well             Past Medical History:  Diagnosis Date   AICD (automatic cardioverter/defibrillator) present    AKI (acute kidney injury) ([place]) 03/27/2017   Anemia    Anginal pain ([place])    Anxiety    Arm pain 05/08/2015   LEFT ARM   Arthritis    Atrioventricular block, complete ([place])    a. 2010 s/p pacemaker.   Bell's palsy    left side. after shingles episode   Bilateral renal cysts 07/23/2017   Simple and hemorrhagic noted on CT ab/pelvis    BPH associated with nocturia    Cardiomegaly    Chronic systolic CHF (congestive heart failure) ([place])    EF normalized by Echo 2019   CKD (chronic kidney disease), stage IV (HCC)    COPD (chronic obstructive pulmonary disease) ([place])    Severe   Coronary artery disease    a. s/p MI in 1994/1995 while in [place] s/p questionable PCI. 03/2015: progression of disease, for staged PCI.   Depression    Diabetic peripheral neuropathy (HCC)    Diarrhea    Diverticulitis    DOE (dyspnea on exertion) 03/28/2015   Full dentures    Gallstones    GERD (gastroesophageal reflux disease)    Gout    Hard of hearing    B/L   Heart murmur    History of chronic pancreatitis 07/23/2017   noted on CT abd/pelvis   History of shingles    Hypercholesterolemia    Hypertension     Ischemic cardiomyopathy    MI (myocardial infarction) ([place]) 1994; 1995   Neuropathy    IN LOWER EXTREMITIES   Nosebleed 10/06/2017   for 2 months most recent 10/06/2017   Obesity    Pacemaker    medtronic>>> MDT ICD 09/23/15   Ruptured appendicitis    Sleep apnea    "sleeps w/humidifyer when he panics and gets short of breath" (04/08/2015)   TIA (transient ischemic attack) X 3   Trigger middle finger of left hand    Type II diabetes mellitus ([place])    Wears glasses    Wears hearing aid     Past Surgical History:  Procedure Laterality Date   APPENDECTOMY     AV FISTULA PLACEMENT Right 02/07/2019   Procedure: ARTERIOVENOUS (AV) FISTULA CREATION RIGHT UPPER ARM;  Surgeon: Waynetta Sandy, MD;  Location: [facility];  Service: Vascular;  Laterality: Right;   CARDIAC CATHETERIZATION N/A 03/29/2015   Procedure: Right/Left Heart Cath and Coronary Angiography;  Surgeon: Stanton M [name], MD;  Location: [facility];  Service: Cardiovascular;  Laterality: N/A;   [place]   "after my MI; put me on heart RX after cath"  CARDIAC CATHETERIZATION N/A 04/09/2015   Procedure: Coronary Stent Intervention;  Surgeon: Ha M [name], MD;  Location: [facility];  Service: Cardiovascular;  Laterality: N/A;   CATARACT EXTRACTION W/ INTRAOCULAR LENS  IMPLANT, BILATERAL     CHOLECYSTECTOMY N/A 10/13/2017   Procedure: LAPAROSCOPIC CHOLECYSTECTOMY WITH LYSIS OF ADHESIONS;  Surgeon: Ileana Roup, MD;  [facility];  Service: General;  Laterality: N/A;   COLONOSCOPY     DENTAL SURGERY     EP IMPLANTABLE DEVICE N/A 09/23/2015   MDT CRT-D, Dr. Caryl Comes   HIATAL HERNIA REPAIR  1977   ILEOCECETOMY N/A 03/27/2017   Procedure: ILEOCECECTOMY;  Surgeon: Ileana Roup, MD;  Location: [facility];  Service: General;  Laterality: N/A;   INSERT / REPLACE / REMOVE PACEMAKER  07/2008   Complete heart block status post DDD with good function   IR FLUORO GUIDE CV LINE RIGHT  04/03/2019    IR US GUIDE [place] RIGHT  04/03/2019   LAPAROTOMY N/A 03/27/2017   Procedure: EXPLORATORY LAPAROTOMY;  Surgeon: Ileana Roup, MD;  Location: [facility];  Service: General;  Laterality: N/A;   TONSILLECTOMY     UPPER GI ENDOSCOPY      There were no vitals filed for this visit.   Subjective Assessment - 08/14/21 1146     Subjective The day has started pretty well.  The doctor reassured me about my fistula and doing push up exercises, she said something hitting that arm would be more problematic.    Pertinent History dialysis Mon, Wed, Fri;  pacemaker with defibrillator; DN, COPD, HTN; DM; neuropathy;  RIGHT ARM FISTULA    How long can you walk comfortably? to the mailbox 50 or 60 paces (coming back can be a trial b/c it's uphill)    Patient Stated Goals I hope we might proceed with practice to get up off the floor and gain strength in my leg; stoop to clean oven/stove    Currently in Pain? No/denies    Pain Score 0-No pain                               [facility] Adult PT Treatment/Exercise - 08/14/21 0001       Therapeutic Activites    Therapeutic Activities Other Therapeutic Activities;ADL's    Other Therapeutic Activities components of getting up/down off the floor in case of fall      Lumbar Exercises: Standing   Other Standing Lumbar Exercises standing with putting hands into seat of chair and pushing back up 10x    Other Standing Lumbar Exercises ankle sway "church pews" 1 min      Lumbar Exercises: Seated   Other Seated Lumbar Exercises leaning on elbow and pushing back up 8x right/left      Lumbar Exercises: Quadruped   Other Quadruped Lumbar Exercises stading to crawl on/off mat table 5x    Other Quadruped Lumbar Exercises mat table to side sit on mat right/left 1x to each side      Knee/Hip Exercises: Aerobic   Nustep L3 8 min while discussing status   98 spm average     Knee/Hip Exercises: Standing   Other Standing Knee Exercises holding the  back of chair and kneeling on foot stool plus cushion 10x    Other Standing Knee Exercises retro stepping 45 sec to each side             Clinical Impressions:  The patient expresses he feels good today and is ready to practice components of getting up off the floor.  He was able to perform standing partial kneeling and climbs up from the chair well.  Quadruped position was also tolerated well.  He had more difficulty going from side sitting on the mat table to quadruped but with extra time and effort he was able to do it without therapist assist.   Also performed standing balance ex's with signs of muscular fatigue/tremors produced at 45 sec.  1 episode of loss of balance while turning today requiring physical assist from therapist to recover.  CGA with most exercises for safety.   Next Visit: 1/2 knee to rise; quadruped; side push ups; recheck STGS/TUG and BERG    PT Short Term Goals - 08/05/21 1330       PT SHORT TERM GOAL #1   Title be independent in initial HEP    Status Achieved      PT SHORT TERM GOAL #2   Title perform TUG in < or = to 27 seconds to improve mobility and improve balance    Status On-going      PT SHORT TERM GOAL #3   Title improve LE strength to perform sit to stand 5x in 20 sec    Status On-going      PT SHORT TERM GOAL #4   Title BERG score improved to 28/56 indicating improved balance and safety    Status On-going               PT Long Term Goals - 07/17/21 1851       PT LONG TERM GOAL #1   Title be independent in advanced HEP    Time 8    Period Weeks    Status New    Target Date 09/11/21      PT LONG TERM GOAL #2   Title perform TUG in < or = to 22 seconds to improve mobility and reduce falls risk    Time 8    Period Weeks    Status New      PT LONG TERM GOAL #3   Title Improved LE strength to walk 475 feet in 3 minutes    Time 8    Period Weeks    Status New      PT LONG TERM GOAL #4   Title demonstrate 4/5 bil LE  strength to improve safety and ability to stoop/squat and to help with getting up off the floor    Time 8    Period Weeks    Status New      PT LONG TERM GOAL #5   Title 5x sit to stand test (no UE use) in 14.25 sec    Time 8    Period Weeks    Status New      PT LONG TERM GOAL #6   Title No loss of balance with 180 turns 3/3 trials    Time 8    Period Weeks    Status New      PT LONG TERM GOAL #7   Title BERG score improved to 34/56 indicating improved balance and decreased risk of falls    Time 8    Period Weeks    Status New                    Patient will benefit from skilled therapeutic intervention in order to improve the following deficits and impairments:  Visit Diagnosis: Muscle weakness (generalized)  Other abnormalities of gait and mobility  Repeated falls     Problem List Patient Active Problem List   Diagnosis Date Noted   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 07/29/2020   Dependence on renal dialysis ([place]) 11/13/2019   Coagulation defect, unspecified ([place]) 05/11/2019   Encounter for immunization 04/25/2019   Thrombocytopenia ([place]) 04/13/2019   Hypokalemia 04/12/2019   Anaphylactic shock, unspecified, initial encounter 04/10/2019   Anemia in chronic kidney disease 41/93/7902   Complication of vascular dialysis catheter 04/10/2019   Iron deficiency anemia, unspecified 04/10/2019   Secondary hyperparathyroidism of renal origin ([place]) 04/10/2019   Hyperkalemia, diminished renal excretion 04/01/2019   Hyperlipidemia associated with type 2 diabetes mellitus ([place]) 04/13/2018   S/P cholecystectomy 10/13/2017   Diarrhea 05/11/2017   Ruptured appendicitis 03/26/2017   Marital stress 02/18/2017   Trigger finger 06/10/2016   BPH associated with nocturia 04/27/2016   Diabetic peripheral neuropathy (HCC)    COPD (chronic obstructive pulmonary disease) ([place])    Bell's palsy    Gout 40/97/3532   Chronic systolic CHF  (congestive heart failure) ([place]) 04/08/2015   Diverticulitis 04/08/2015   DOE (dyspnea on exertion), due to significant CAD 03/28/2015   ESRD on hemodialysis ([place]) 03/04/2015   CKD (chronic kidney disease) stage 4, GFR 15-29 ml/min (HCC) 03/04/2015   CAD (coronary artery disease) 09/13/2014   GERD (gastroesophageal reflux disease) 08/07/2012   Major depression, recurrent, full remission ([place]) 08/05/2012   Sleep apnea 08/05/2012   Controlled type 2 diabetes mellitus with chronic kidney disease on chronic dialysis ([place]) 08/05/2012   Hypertension associated with diabetes ([place]) 08/05/2012   Diabetes type 2, uncontrolled 08/05/2012   Ischemic cardiomyopathy 11/03/2011   Atrioventricular block, complete [facility])    Obesity    Pacemaker    Ruben Im, PT 08/14/21 3:52 PM Phone: [phone] Fax: [phone]  Alvera Singh, PT 08/14/2021, 3:44 PM  [facility] @ [place], [place], [address] Phone: [phone]   Fax:  [phone]  Name: Yeray Tomas MRN: [id number] Date of Birth: 06/16/1932

## 2021-08-18 ENCOUNTER — Ambulatory Visit (INDEPENDENT_AMBULATORY_CARE_PROVIDER_SITE_OTHER): Payer: PPO

## 2021-08-18 DIAGNOSIS — I442 Atrioventricular block, complete: Secondary | ICD-10-CM

## 2021-08-19 ENCOUNTER — Ambulatory Visit: Payer: PPO | Admitting: Physical Therapy

## 2021-08-19 ENCOUNTER — Other Ambulatory Visit: Payer: Self-pay

## 2021-08-19 DIAGNOSIS — R296 Repeated falls: Secondary | ICD-10-CM

## 2021-08-19 DIAGNOSIS — R2689 Other abnormalities of gait and mobility: Secondary | ICD-10-CM

## 2021-08-19 DIAGNOSIS — M6281 Muscle weakness (generalized): Secondary | ICD-10-CM | POA: Diagnosis not present

## 2021-08-19 LAB — CUP PACEART REMOTE DEVICE CHECK
Battery Remaining Longevity: 1 mo
Battery Voltage: 2.7 V
Brady Statistic AP VP Percent: 81.66 %
Brady Statistic AP VS Percent: 0.01 %
Brady Statistic AS VP Percent: 18.31 %
Brady Statistic AS VS Percent: 0.02 %
Brady Statistic RA Percent Paced: 81.57 %
Brady Statistic RV Percent Paced: 99.85 %
Date Time Interrogation Session: 20230206043824
HighPow Impedance: 66 Ohm
Implantable Lead Implant Date: 20100112
Implantable Lead Implant Date: 20170313
Implantable Lead Implant Date: 20170313
Implantable Lead Location: 753858
Implantable Lead Location: 753859
Implantable Lead Location: 753860
Implantable Lead Model: 4598
Implantable Lead Model: 5076
Implantable Pulse Generator Implant Date: 20170313
Lead Channel Impedance Value: 399 Ohm
Lead Channel Impedance Value: 399 Ohm
Lead Channel Impedance Value: 418 Ohm
Lead Channel Impedance Value: 456 Ohm
Lead Channel Impedance Value: 456 Ohm
Lead Channel Impedance Value: 475 Ohm
Lead Channel Impedance Value: 513 Ohm
Lead Channel Impedance Value: 589 Ohm
Lead Channel Impedance Value: 760 Ohm
Lead Channel Impedance Value: 760 Ohm
Lead Channel Impedance Value: 817 Ohm
Lead Channel Impedance Value: 817 Ohm
Lead Channel Impedance Value: 817 Ohm
Lead Channel Pacing Threshold Amplitude: 0.5 V
Lead Channel Pacing Threshold Amplitude: 1.25 V
Lead Channel Pacing Threshold Amplitude: 1.625 V
Lead Channel Pacing Threshold Pulse Width: 0.4 ms
Lead Channel Pacing Threshold Pulse Width: 0.4 ms
Lead Channel Pacing Threshold Pulse Width: 0.4 ms
Lead Channel Sensing Intrinsic Amplitude: 0.375 mV
Lead Channel Sensing Intrinsic Amplitude: 0.375 mV
Lead Channel Sensing Intrinsic Amplitude: 12.375 mV
Lead Channel Sensing Intrinsic Amplitude: 12.375 mV
Lead Channel Setting Pacing Amplitude: 2.5 V
Lead Channel Setting Pacing Amplitude: 2.5 V
Lead Channel Setting Pacing Amplitude: 3.25 V
Lead Channel Setting Pacing Pulse Width: 0.4 ms
Lead Channel Setting Pacing Pulse Width: 0.4 ms
Lead Channel Setting Sensing Sensitivity: 0.3 mV

## 2021-08-19 NOTE — Therapy (Signed)
[facility] @ [place], [place], [address] Phone: [phone]   Fax:  [phone]  Physical Therapy Treatment  Patient Details  Name: Rodney Farley MRN: [id number] Date of Birth: [birth year] Referring Provider (PT): Dr. Garret Reddish   Encounter Date: 08/19/2021   PT End of Session - 08/19/21 1227     Visit Number 9    Date for PT Re-Evaluation 09/11/21    Authorization Type [employer] 10th visit progress note    Progress Note Due on Visit 10    PT Start Time 1145    PT Stop Time 1225    PT Time Calculation (min) 40 min    Activity Tolerance Patient tolerated treatment well             Past Medical History:  Diagnosis Date   AICD (automatic cardioverter/defibrillator) present    AKI (acute kidney injury) ([place]) 03/27/2017   Anemia    Anginal pain ([place])    Anxiety    Arm pain 05/08/2015   LEFT ARM   Arthritis    Atrioventricular block, complete ([place])    a. 2010 s/p pacemaker.   Bell's palsy    left side. after shingles episode   Bilateral renal cysts 07/23/2017   Simple and hemorrhagic noted on CT ab/pelvis    BPH associated with nocturia    Cardiomegaly    Chronic systolic CHF (congestive heart failure) ([place])    EF normalized by Echo 2019   CKD (chronic kidney disease), stage IV (HCC)    COPD (chronic obstructive pulmonary disease) ([place])    Severe   Coronary artery disease    a. s/p MI in 1994/1995 while in [place] s/p questionable PCI. 03/2015: progression of disease, for staged PCI.   Depression    Diabetic peripheral neuropathy (HCC)    Diarrhea    Diverticulitis    DOE (dyspnea on exertion) 03/28/2015   Full dentures    Gallstones    GERD (gastroesophageal reflux disease)    Gout    Hard of hearing    B/L   Heart murmur    History of chronic pancreatitis 07/23/2017   noted on CT abd/pelvis   History of shingles    Hypercholesterolemia    Hypertension     Ischemic cardiomyopathy    MI (myocardial infarction) ([place]) 1994; 1995   Neuropathy    IN LOWER EXTREMITIES   Nosebleed 10/06/2017   for 2 months most recent 10/06/2017   Obesity    Pacemaker    medtronic>>> MDT ICD 09/23/15   Ruptured appendicitis    Sleep apnea    "sleeps w/humidifyer when he panics and gets short of breath" (04/08/2015)   TIA (transient ischemic attack) X 3   Trigger middle finger of left hand    Type II diabetes mellitus ([place])    Wears glasses    Wears hearing aid     Past Surgical History:  Procedure Laterality Date   APPENDECTOMY     AV FISTULA PLACEMENT Right 02/07/2019   Procedure: ARTERIOVENOUS (AV) FISTULA CREATION RIGHT UPPER ARM;  Surgeon: Waynetta Sandy, MD;  Location: [facility];  Service: Vascular;  Laterality: Right;   CARDIAC CATHETERIZATION N/A 03/29/2015   Procedure: Right/Left Heart Cath and Coronary Angiography;  Surgeon: Garcia M [name], MD;  Location: [facility];  Service: Cardiovascular;  Laterality: N/A;   [place]   "after my MI; put me on heart RX after cath"  CARDIAC CATHETERIZATION N/A 04/09/2015   Procedure: Coronary Stent Intervention;  Surgeon: Bird M [name], MD;  Location: [facility];  Service: Cardiovascular;  Laterality: N/A;   CATARACT EXTRACTION W/ INTRAOCULAR LENS  IMPLANT, BILATERAL     CHOLECYSTECTOMY N/A 10/13/2017   Procedure: LAPAROSCOPIC CHOLECYSTECTOMY WITH LYSIS OF ADHESIONS;  Surgeon: Ileana Roup, MD;  [facility];  Service: General;  Laterality: N/A;   COLONOSCOPY     DENTAL SURGERY     EP IMPLANTABLE DEVICE N/A 09/23/2015   MDT CRT-D, Dr. Caryl Comes   HIATAL HERNIA REPAIR  1977   ILEOCECETOMY N/A 03/27/2017   Procedure: ILEOCECECTOMY;  Surgeon: Ileana Roup, MD;  Location: [facility];  Service: General;  Laterality: N/A;   INSERT / REPLACE / REMOVE PACEMAKER  07/2008   Complete heart block status post DDD with good function   IR FLUORO GUIDE CV LINE RIGHT  04/03/2019    IR US GUIDE [place] RIGHT  04/03/2019   LAPAROTOMY N/A 03/27/2017   Procedure: EXPLORATORY LAPAROTOMY;  Surgeon: Ileana Roup, MD;  Location: [facility];  Service: General;  Laterality: N/A;   TONSILLECTOMY     UPPER GI ENDOSCOPY      There were no vitals filed for this visit.   Subjective Assessment - 08/19/21 1152     Subjective Only minor soreness after last visit.  Today my neuropathy on the right foot is bothering me.  I have nausea today too.    Pertinent History dialysis Mon, Wed, Fri;  pacemaker with defibrillator; DN, COPD, HTN; DM; neuropathy;  RIGHT ARM FISTULA    How long can you walk comfortably? to the mailbox 50 or 60 paces (coming back can be a trial b/c it's uphill)    Patient Stated Goals I hope we might proceed with practice to get up off the floor and gain strength in my leg; stoop to clean oven/stove    Currently in Pain? Yes    Pain Score 5     Pain Location Foot    Pain Orientation Right    Pain Type Chronic pain                               [facility] Adult PT Treatment/Exercise - 08/19/21 0001       Lumbar Exercises: Standing   Wall Slides Limitations elbow to hand push up from wall 8x right/left    Other Standing Lumbar Exercises side wall push ups 8x right/left      Lumbar Exercises: Seated   Sit to Stand Limitations blue band rows 10x, single arm extensions 10x right/left    Other Seated Lumbar Exercises seated 5# kettlebell pull ups 10x    Other Seated Lumbar Exercises seated sit ups 5#10x      Knee/Hip Exercises: Aerobic   Nustep L4 8 min while discussing status   94 spm average     Knee/Hip Exercises: Seated   Other Seated Knee/Hip Exercises 5# hip flexion/abduction to touch pod 2x10 each side                       PT Short Term Goals - 08/05/21 1330       PT SHORT TERM GOAL #1   Title be independent in initial HEP    Status Achieved      PT SHORT TERM GOAL #2   Title perform TUG in < or = to 27  seconds to improve mobility and improve balance    Status On-going      PT SHORT TERM GOAL #3   Title improve LE strength to perform sit to stand 5x in 20 sec    Status On-going      PT SHORT TERM GOAL #4   Title BERG score improved to 28/56 indicating improved balance and safety    Status On-going               PT Long Term Goals - 07/17/21 1851       PT LONG TERM GOAL #1   Title be independent in advanced HEP    Time 8    Period Weeks    Status New    Target Date 09/11/21      PT LONG TERM GOAL #2   Title perform TUG in < or = to 22 seconds to improve mobility and reduce falls risk    Time 8    Period Weeks    Status New      PT LONG TERM GOAL #3   Title Improved LE strength to walk 475 feet in 3 minutes    Time 8    Period Weeks    Status New      PT LONG TERM GOAL #4   Title demonstrate 4/5 bil LE strength to improve safety and ability to stoop/squat and to help with getting up off the floor    Time 8    Period Weeks    Status New      PT LONG TERM GOAL #5   Title 5x sit to stand test (no UE use) in 14.25 sec    Time 8    Period Weeks    Status New      PT LONG TERM GOAL #6   Title No loss of balance with 180 turns 3/3 trials    Time 8    Period Weeks    Status New      PT LONG TERM GOAL #7   Title BERG score improved to 34/56 indicating improved balance and decreased risk of falls    Time 8    Period Weeks    Status New                   Plan - 08/19/21 1502     Clinical Impression Statement The patient expresses he generally is not feeling well today upon arrival secondary to LE neuropathy and nausea.  Decreased exercise intensity based on this but he was able to complete seated and standing ex's with only very short rest breaks.  Therapist closely monitoring response and providing close supervision for safety with standing ex's.  He has 1 episode of loss of balance while turning to prepare to put his jacket on requiring min assist to  recover.  He reports at the end of session that nausea had subsided.    Comorbidities CKD with dialysis Mon, Wed,Fri; RIGHT ARM FISTULA;  DM;  pacemaker/defibrillator; COPD; HTN; neuropathy    Examination-Participation Restrictions Cleaning;Community Activity;Driving;Interpersonal Relationship    Rehab Potential Good    PT Frequency 2x / week    PT Duration 8 weeks    PT Treatment/Interventions ADLs/Self Care Home Management;Therapeutic activities;Neuromuscular re-education;Therapeutic exercise;Manual techniques;Patient/family education;Stair training;Gait training;Functional mobility training    PT Next Visit Plan 10th visit progress note;  TUG, BERG, 5x sit to stand;  Nu-Step; sit to stands; standing ex's for balance and strength; seated core and LE strengthening; 90  degree turns;  step ups; circuit; recheck TUG, BERG and 5x sit to stand; tquadruped crawling on/off mat table; rising from low seat; 1/2 kneel to rise    PT Home Exercise Plan Access Code [facility]             Patient will benefit from skilled therapeutic intervention in order to improve the following deficits and impairments:  Difficulty walking, Pain, Decreased activity tolerance, Impaired perceived functional ability, Decreased balance, Decreased strength, Decreased mobility  Visit Diagnosis: Muscle weakness (generalized)  Other abnormalities of gait and mobility  Repeated falls     Problem List Patient Active Problem List   Diagnosis Date Noted   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 07/29/2020   Dependence on renal dialysis ([place]) 11/13/2019   Coagulation defect, unspecified ([place]) 05/11/2019   Encounter for immunization 04/25/2019   Thrombocytopenia ([place]) 04/13/2019   Hypokalemia 04/12/2019   Anaphylactic shock, unspecified, initial encounter 04/10/2019   Anemia in chronic kidney disease 72/62/0355   Complication of vascular dialysis catheter 04/10/2019   Iron deficiency anemia,  unspecified 04/10/2019   Secondary hyperparathyroidism of renal origin ([place]) 04/10/2019   Hyperkalemia, diminished renal excretion 04/01/2019   Hyperlipidemia associated with type 2 diabetes mellitus ([place]) 04/13/2018   S/P cholecystectomy 10/13/2017   Diarrhea 05/11/2017   Ruptured appendicitis 03/26/2017   Marital stress 02/18/2017   Trigger finger 06/10/2016   BPH associated with nocturia 04/27/2016   Diabetic peripheral neuropathy (HCC)    COPD (chronic obstructive pulmonary disease) ([place])    Bell's palsy    Gout 97/41/6384   Chronic systolic CHF (congestive heart failure) ([place]) 04/08/2015   Diverticulitis 04/08/2015   DOE (dyspnea on exertion), due to significant CAD 03/28/2015   ESRD on hemodialysis ([employer]) 03/04/2015   CKD (chronic kidney disease) stage 4, GFR 15-29 ml/min (HCC) 03/04/2015   CAD (coronary artery disease) 09/13/2014   GERD (gastroesophageal reflux disease) 08/07/2012   Major depression, recurrent, full remission ([place]) 08/05/2012   Sleep apnea 08/05/2012   Controlled type 2 diabetes mellitus with chronic kidney disease on chronic dialysis ([place]) 08/05/2012   Hypertension associated with diabetes ([place]) 08/05/2012   Diabetes type 2, uncontrolled 08/05/2012   Ischemic cardiomyopathy 11/03/2011   Atrioventricular block, complete [facility])    Obesity    Pacemaker    Ruben Im, PT 08/19/21 3:08 PM Phone: [phone] Fax: [phone]  Alvera Singh, PT 08/19/2021, 3:07 PM  [facility] @ [place], [place], [address] Phone: [phone]   Fax:  [phone]  Name: Rodney Farley MRN: [id number] Date of Birth: [birth year]

## 2021-08-21 ENCOUNTER — Other Ambulatory Visit: Payer: Self-pay

## 2021-08-21 ENCOUNTER — Ambulatory Visit: Payer: PPO | Admitting: Physical Therapy

## 2021-08-21 DIAGNOSIS — M6281 Muscle weakness (generalized): Secondary | ICD-10-CM

## 2021-08-21 DIAGNOSIS — R2689 Other abnormalities of gait and mobility: Secondary | ICD-10-CM

## 2021-08-21 DIAGNOSIS — R296 Repeated falls: Secondary | ICD-10-CM

## 2021-08-21 NOTE — Therapy (Signed)
[facility] @ [place], [place], [address] Phone: [phone]   Fax:  [phone]  Physical Therapy Treatment  Patient Details  Name: Rodney Farley MRN: [id number] Date of Birth: September 06, 1931 Referring Provider (PT): Dr. Garret Reddish  Progress Note Reporting Period 07/17/2021  to 08/21/2021  See note below for Objective Data and Assessment of Progress/Goals.     Encounter Date: 08/21/2021   PT End of Session - 08/21/21 1150     Visit Number 10    Date for PT Re-Evaluation 09/11/21    Authorization Type [employer] 10th visit progress note    Progress Note Due on Visit 10    PT Start Time 1147    PT Stop Time 1225    PT Time Calculation (min) 38 min    Activity Tolerance Patient tolerated treatment well             Past Medical History:  Diagnosis Date   AICD (automatic cardioverter/defibrillator) present    AKI (acute kidney injury) ([place]) 03/27/2017   Anemia    Anginal pain (HCC)    Anxiety    Arm pain 05/08/2015   LEFT ARM   Arthritis    Atrioventricular block, complete ([place])    a. 2010 s/p pacemaker.   Bell's palsy    left side. after shingles episode   Bilateral renal cysts 07/23/2017   Simple and hemorrhagic noted on CT ab/pelvis    BPH associated with nocturia    Cardiomegaly    Chronic systolic CHF (congestive heart failure) ([place])    EF normalized by Echo 2019   CKD (chronic kidney disease), stage IV (HCC)    COPD (chronic obstructive pulmonary disease) ([place])    Severe   Coronary artery disease    a. s/p MI in 1994/1995 while in [place] s/p questionable PCI. 03/2015: progression of disease, for staged PCI.   Depression    Diabetic peripheral neuropathy (HCC)    Diarrhea    Diverticulitis    DOE (dyspnea on exertion) 03/28/2015   Full dentures    Gallstones    GERD (gastroesophageal reflux disease)    Gout    Hard of hearing    B/L   Heart murmur     History of chronic pancreatitis 07/23/2017   noted on CT abd/pelvis   History of shingles    Hypercholesterolemia    Hypertension    Ischemic cardiomyopathy    MI (myocardial infarction) ([place]) 1994; 1995   Neuropathy    IN LOWER EXTREMITIES   Nosebleed 10/06/2017   for 2 months most recent 10/06/2017   Obesity    Pacemaker    medtronic>>> MDT ICD 09/23/15   Ruptured appendicitis    Sleep apnea    "sleeps w/humidifyer when he panics and gets short of breath" (04/08/2015)   TIA (transient ischemic attack) X 3   Trigger middle finger of left hand    Type II diabetes mellitus ([place])    Wears glasses    Wears hearing aid     Past Surgical History:  Procedure Laterality Date   APPENDECTOMY     AV FISTULA PLACEMENT Right 02/07/2019   Procedure: ARTERIOVENOUS (AV) FISTULA CREATION RIGHT UPPER ARM;  Surgeon: Waynetta Sandy, MD;  Location: [facility];  Service: Vascular;  Laterality: Right;   CARDIAC CATHETERIZATION N/A 03/29/2015   Procedure: Right/Left Heart Cath and Coronary Angiography;  Surgeon: Sione M [name], MD;  Location: [facility];  Service:  Cardiovascular;  Laterality: N/A;   [place]   "after my MI; put me on heart RX after cath"   CARDIAC CATHETERIZATION N/A 04/09/2015   Procedure: Coronary Stent Intervention;  Surgeon: Garris M [name], MD;  Location: [facility];  Service: Cardiovascular;  Laterality: N/A;   CATARACT EXTRACTION W/ INTRAOCULAR LENS  IMPLANT, BILATERAL     CHOLECYSTECTOMY N/A 10/13/2017   Procedure: LAPAROSCOPIC CHOLECYSTECTOMY WITH LYSIS OF ADHESIONS;  Surgeon: Ileana Roup, MD;  [facility];  Service: General;  Laterality: N/A;   COLONOSCOPY     DENTAL SURGERY     EP IMPLANTABLE DEVICE N/A 09/23/2015   MDT CRT-D, Dr. Caryl Comes   HIATAL HERNIA REPAIR  1977   ILEOCECETOMY N/A 03/27/2017   Procedure: ILEOCECECTOMY;  Surgeon: Ileana Roup, MD;  Location: [facility];  Service: General;  Laterality: N/A;   INSERT /  REPLACE / REMOVE PACEMAKER  07/2008   Complete heart block status post DDD with good function   IR FLUORO GUIDE CV LINE RIGHT  04/03/2019   IR US GUIDE [place] RIGHT  04/03/2019   LAPAROTOMY N/A 03/27/2017   Procedure: EXPLORATORY LAPAROTOMY;  Surgeon: Ileana Roup, MD;  Location: [facility];  Service: General;  Laterality: N/A;   TONSILLECTOMY     UPPER GI ENDOSCOPY      There were no vitals filed for this visit.   Subjective Assessment - 08/21/21 1151     Subjective I'm much better today.  Not  the misery I was in last time.    Pertinent History dialysis Mon, Wed, Fri;  pacemaker with defibrillator; DN, COPD, HTN; DM; neuropathy;  RIGHT ARM FISTULA    How long can you walk comfortably? to the mailbox 50 or 60 paces (coming back can be a trial b/c it's uphill)    Patient Stated Goals I hope we might proceed with practice to get up off the floor and gain strength in my leg; stoop to clean oven/stove    Currently in Pain? No/denies    Pain Score 0-No pain                [facility] PT Assessment - 08/21/21 0001       Strength   Overall Strength Comments able to rise consistently with ease without UE use      Standardized Balance Assessment   Five times sit to stand comments  13 no UE assist needed      Berg Balance Test   Sit to Stand Able to stand without using hands and stabilize independently    Standing Unsupported Able to stand safely 2 minutes    Sitting with Back Unsupported but Feet Supported on Floor or Stool Able to sit safely and securely 2 minutes    Stand to Sit Sits safely with minimal use of hands    Transfers Able to transfer safely, minor use of hands    Standing Unsupported with Eyes Closed Able to stand 10 seconds safely    Standing Unsupported with Feet Together Able to place feet together independently and stand 1 minute safely    From Standing, Reach Forward with Outstretched Arm Can reach forward >12 cm safely (5")    From Standing Position, Pick up  Object from Floor Able to pick up shoe, needs supervision    From Standing Position, Turn to Look Behind Over each Shoulder Looks behind one side only/other side shows less weight shift    Turn 360 Degrees Able  to turn 360 degrees safely but slowly    Standing Unsupported, Alternately Place Feet on Step/Stool Able to stand independently and safely and complete 8 steps in 20 seconds    Standing Unsupported, One Foot in Front Needs help to step but can hold 15 seconds    Standing on One Leg Tries to lift leg/unable to hold 3 seconds but remains standing independently    Total Score 45      Timed Up and Go Test   Normal TUG (seconds) 13   no UE assist to rise; with RW                          [facility] Adult PT Treatment/Exercise - 08/21/21 0001       Therapeutic Activites    Other Therapeutic Activities stooping to simulate reaching into low cabinet and retrieving objects      Lumbar Exercises: Standing   Other Standing Lumbar Exercises standing at base of steps and hands down on 2nd step and back upright 8x    Other Standing Lumbar Exercises lateral and diagonal stepping for dynamic balance challenge      Knee/Hip Exercises: Aerobic   Nustep L4 8 min while discussing status   94 spm average     Knee/Hip Exercises: Standing   Forward Step Up Right;Left;1 set;5 reps    Forward Step Up Limitations minimal to no use of railings    Other Standing Knee Exercises standing at base of steps to kneeling on 1st step with foam pod and back to feet 8x with railing assist bil to rise                       PT Short Term Goals - 08/21/21 1742       PT SHORT TERM GOAL #1   Title be independent in initial HEP    Status Achieved      PT SHORT TERM GOAL #2   Title perform TUG in < or = to 27 seconds to improve mobility and improve balance    Status Achieved      PT SHORT TERM GOAL #3   Title improve LE strength to perform sit to stand 5x in 20 sec    Status Achieved       PT SHORT TERM GOAL #4   Title BERG score improved to 28/56 indicating improved balance and safety    Status Achieved               PT Long Term Goals - 08/21/21 1742       PT LONG TERM GOAL #1   Title be independent in advanced HEP    Time 8    Period Weeks    Status On-going      PT LONG TERM GOAL #2   Title perform TUG in < or = to 22 seconds to improve mobility and reduce falls risk    Status Achieved      PT LONG TERM GOAL #3   Title Improved LE strength to walk 475 feet in 3 minutes    Time 8    Period Weeks    Status On-going      PT LONG TERM GOAL #4   Title demonstrate 4/5 bil LE strength to improve safety and ability to stoop/squat and to help with getting up off the floor    Time 8    Period Weeks    Status On-going  PT LONG TERM GOAL #5   Title 5x sit to stand test (no UE use) in 14.25 sec    Status Achieved      PT LONG TERM GOAL #6   Title No loss of balance with 180 turns 3/3 trials    Time 8    Period Weeks    Status On-going      PT LONG TERM GOAL #7   Title BERG score improved to 34/56 indicating improved balance and decreased risk of falls    Baseline 45/56    Status Achieved                   Plan - 08/21/21 1737     Clinical Impression Statement The patient demonstrates dramatic improvements in objective retesting:  Timed up and go improved from 27 sec to 13 sec, 5x sit to stand improved from 20 sec to 13 sec and BERG score improved from 28/56 (100% risk of falls) to 45/56 (50% risk of falls).  In addition to strengthening we continue to practice functional movement to meet his personal goals of being able to get up off the floor and to reach for things in low cabinets.  Progressing well with all rehab goals.    Personal Factors and Comorbidities Comorbidity 1;Comorbidity 2;Comorbidity 3+    Comorbidities CKD with dialysis Mon, Wed,Fri; RIGHT ARM FISTULA;  DM;  pacemaker/defibrillator; COPD; HTN; neuropathy     Examination-Participation Restrictions Cleaning;Community Activity;Driving;Interpersonal Relationship    PT Frequency 2x / week    PT Duration 8 weeks    PT Treatment/Interventions ADLs/Self Care Home Management;Therapeutic activities;Neuromuscular re-education;Therapeutic exercise;Manual techniques;Patient/family education;Stair training;Gait training;Functional mobility training    PT Next Visit Plan Nu-Step; sit to stands; standing ex's for balance and strength; seated core and LE strengthening; 90 degree turns;  step ups; quadruped crawling on/off mat table; rising from low seat; 1/2 kneel to rise             Patient will benefit from skilled therapeutic intervention in order to improve the following deficits and impairments:  Difficulty walking, Pain, Decreased activity tolerance, Impaired perceived functional ability, Decreased balance, Decreased strength, Decreased mobility  Visit Diagnosis: Muscle weakness (generalized)  Other abnormalities of gait and mobility  Repeated falls     Problem List Patient Active Problem List   Diagnosis Date Noted   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 07/29/2020   Dependence on renal dialysis ([place]) 11/13/2019   Coagulation defect, unspecified ([place]) 05/11/2019   Encounter for immunization 04/25/2019   Thrombocytopenia ([place]) 04/13/2019   Hypokalemia 04/12/2019   Anaphylactic shock, unspecified, initial encounter 04/10/2019   Anemia in chronic kidney disease 29/56/2130   Complication of vascular dialysis catheter 04/10/2019   Iron deficiency anemia, unspecified 04/10/2019   Secondary hyperparathyroidism of renal origin ([place]) 04/10/2019   Hyperkalemia, diminished renal excretion 04/01/2019   Hyperlipidemia associated with type 2 diabetes mellitus ([place]) 04/13/2018   S/P cholecystectomy 10/13/2017   Diarrhea 05/11/2017   Ruptured appendicitis 03/26/2017   Marital stress 02/18/2017   Trigger finger 06/10/2016   BPH  associated with nocturia 04/27/2016   Diabetic peripheral neuropathy (HCC)    COPD (chronic obstructive pulmonary disease) ([place])    Bell's palsy    Gout 86/57/8469   Chronic systolic CHF (congestive heart failure) ([place]) 04/08/2015   Diverticulitis 04/08/2015   DOE (dyspnea on exertion), due to significant CAD 03/28/2015   ESRD on hemodialysis ([place]) 03/04/2015   CKD (chronic kidney disease) stage 4, GFR 15-29 ml/min (  [place]) 03/04/2015   CAD (coronary artery disease) 09/13/2014   GERD (gastroesophageal reflux disease) 08/07/2012   Major depression, recurrent, full remission ([place]) 08/05/2012   Sleep apnea 08/05/2012   Controlled type 2 diabetes mellitus with chronic kidney disease on chronic dialysis ([place]) 08/05/2012   Hypertension associated with diabetes ([place]) 08/05/2012   Diabetes type 2, uncontrolled 08/05/2012   Ischemic cardiomyopathy 11/03/2011   Atrioventricular block, complete [facility])    Obesity    Pacemaker    Ruben Im, PT 08/21/21 5:44 PM Phone: [phone] Fax: [phone]   Alvera Singh, PT 08/21/2021, 5:43 PM  [facility] @ [place], [place], [address] Phone: [phone]   Fax:  [phone]  Name: Sophia Cubero MRN: [id number] Date of Birth: Dec 07, 1931

## 2021-08-21 NOTE — Progress Notes (Signed)
Remote ICD transmission.   

## 2021-08-22 ENCOUNTER — Other Ambulatory Visit: Payer: Self-pay | Admitting: Podiatry

## 2021-08-22 ENCOUNTER — Other Ambulatory Visit: Payer: Self-pay | Admitting: Family Medicine

## 2021-08-22 DIAGNOSIS — F32A Depression, unspecified: Secondary | ICD-10-CM

## 2021-08-22 MED ORDER — GABAPENTIN 300 MG PO CAPS: ORAL_CAPSULE | ORAL | 0 refills | Status: DC

## 2021-08-22 MED ORDER — RENA-VITE PO TABS: 1.0000 | ORAL_TABLET | Freq: Every day | ORAL | 3 refills | Status: DC

## 2021-08-26 ENCOUNTER — Ambulatory Visit: Payer: PPO | Admitting: Internal Medicine

## 2021-08-26 ENCOUNTER — Other Ambulatory Visit: Payer: Self-pay

## 2021-08-26 ENCOUNTER — Encounter: Payer: PPO | Admitting: Physical Therapy

## 2021-08-26 ENCOUNTER — Encounter: Payer: Self-pay | Admitting: Internal Medicine

## 2021-08-26 VITALS — BP 132/50 | HR 80 | Ht 67.0 in | Wt 215.8 lb

## 2021-08-26 DIAGNOSIS — I255 Ischemic cardiomyopathy: Secondary | ICD-10-CM | POA: Diagnosis not present

## 2021-08-26 DIAGNOSIS — I442 Atrioventricular block, complete: Secondary | ICD-10-CM

## 2021-08-26 DIAGNOSIS — I4891 Unspecified atrial fibrillation: Secondary | ICD-10-CM | POA: Diagnosis not present

## 2021-08-26 DIAGNOSIS — I5022 Chronic systolic (congestive) heart failure: Secondary | ICD-10-CM

## 2021-08-26 DIAGNOSIS — Z95810 Presence of automatic (implantable) cardiac defibrillator: Secondary | ICD-10-CM

## 2021-08-26 NOTE — Progress Notes (Signed)
Patient ID: Rodney Farley, male   DOB: 1932/07/06, 88 y.o.   MRN: [id number]        Patient Care Team: [name], MD as PCP - General (Family Medicine) [name], Matan M, MD as PCP - Cardiology (Cardiology) Evelina Bucy, DPM as Consulting Physician (Podiatry) Katy Fitch, Darlina Guys, MD as Consulting Physician (Ophthalmology) Jamal Maes, MD as Consulting Physician (Nephrology) Deboraha Sprang, MD as Consulting Physician (Cardiology) [facility]   HPI  Rodney Farley is a 88 y.o. male Seen in follow-up for a pacemaker implanted in 2010 elsewhere. We saw him to establish in 2013. He has a history of complete heart block. He underwent CRT-D upgrade 3/17. Device now at [facility]   He has history of ischemic heart disease with prior MI.   Chronic dyspnea.  No significant chest pain.  Not very ambulatory.  Mild edema  DATE TEST EF   3/19 Echo  35-40%   12/16 Echo  25-30%   7/17 Echo   65 %   4/19 Echo   60-65 %   4/19 Myoview 45-50% Scar   Date   Cr  K      Hgb    11/20 5 5.0       9.9   4/21   4.5        11.6   1/22 0       6.3     Past Medical History:  Diagnosis Date   AICD (automatic cardioverter/defibrillator) present    AKI (acute kidney injury) ([place]) 03/27/2017   Anemia    Anginal pain ([place])    Anxiety    Arm pain 05/08/2015   LEFT ARM   Arthritis    Atrioventricular block, complete ([place])    a. 2010 s/p pacemaker.   Bell's palsy    left side. after shingles episode   Bilateral renal cysts 07/23/2017   Simple and hemorrhagic noted on CT ab/pelvis    BPH associated with nocturia    Cardiomegaly    Chronic systolic CHF (congestive heart failure) ([place])    EF normalized by Echo 2019   CKD (chronic kidney disease), stage IV (HCC)    COPD (chronic obstructive pulmonary disease) ([place])    Severe   Coronary artery disease    a. s/p MI in 1994/1995 while in [place] s/p questionable PCI. 03/2015: progression of disease, for  staged PCI.   Depression    Diabetic peripheral neuropathy (HCC)    Diarrhea    Diverticulitis    DOE (dyspnea on exertion) 03/28/2015   Full dentures    Gallstones    GERD (gastroesophageal reflux disease)    Gout    Hard of hearing    B/L   Heart murmur    History of chronic pancreatitis 07/23/2017   noted on CT abd/pelvis   History of shingles    Hypercholesterolemia    Hypertension    Ischemic cardiomyopathy    MI (myocardial infarction) ([place]) 1994; 1995   Neuropathy    IN LOWER EXTREMITIES   Nosebleed 10/06/2017   for 2 months most recent 10/06/2017   Obesity    Pacemaker    medtronic>>> MDT ICD 09/23/15   Ruptured appendicitis    Sleep apnea    "sleeps w/humidifyer when he panics and gets short of breath" (04/08/2015)   TIA (transient ischemic attack) X 3   Trigger middle finger of left hand    Type II diabetes mellitus ([place])    Wears  glasses    Wears hearing aid    Past Surgical History:  Procedure Laterality Date   APPENDECTOMY     AV FISTULA PLACEMENT Right 02/07/2019   Procedure: ARTERIOVENOUS (AV) FISTULA CREATION RIGHT UPPER ARM;  Surgeon: Waynetta Sandy, MD;  Location: [facility];  Service: Vascular;  Laterality: Right;   CARDIAC CATHETERIZATION N/A 03/29/2015   Procedure: Right/Left Heart Cath and Coronary Angiography;  Surgeon: Carlus M [name], MD;  Location: [facility];  Service: Cardiovascular;  Laterality: N/A;   [place]   "after my MI; put me on heart RX after cath"   CARDIAC CATHETERIZATION N/A 04/09/2015   Procedure: Coronary Stent Intervention;  Surgeon: Lillard M [name], MD;  Location: [facility];  Service: Cardiovascular;  Laterality: N/A;   CATARACT EXTRACTION W/ INTRAOCULAR LENS  IMPLANT, BILATERAL     CHOLECYSTECTOMY N/A 10/13/2017   Procedure: LAPAROSCOPIC CHOLECYSTECTOMY WITH LYSIS OF ADHESIONS;  Surgeon: Ileana Roup, MD;  [facility];  Service: General;  Laterality: N/A;   COLONOSCOPY      DENTAL SURGERY     EP IMPLANTABLE DEVICE N/A 09/23/2015   MDT CRT-D, Dr. Caryl Comes   HIATAL HERNIA REPAIR  1977   ILEOCECETOMY N/A 03/27/2017   Procedure: ILEOCECECTOMY;  Surgeon: Ileana Roup, MD;  Location: [facility];  Service: General;  Laterality: N/A;   INSERT / REPLACE / REMOVE PACEMAKER  07/2008   Complete heart block status post DDD with good function   IR FLUORO GUIDE CV LINE RIGHT  04/03/2019   IR US GUIDE VASC ACCESS RIGHT  04/03/2019   LAPAROTOMY N/A 03/27/2017   Procedure: EXPLORATORY LAPAROTOMY;  Surgeon: Ileana Roup, MD;  Location: [facility];  Service: General;  Laterality: N/A;   TONSILLECTOMY     UPPER GI ENDOSCOPY      Current Outpatient Medications  Medication Sig Dispense Refill   aspirin EC 81 MG tablet Take 81 mg by mouth daily.     atorvastatin (LIPITOR) 40 MG tablet Take 1 tablet by mouth once daily 90 tablet 0   carbamide peroxide (DEBROX) 6.5 % OTIC solution Place 5 drops into both ears 2 (two) times daily. 15 mL 2   Carboxymethylcellul-Glycerin (LUBRICATING EYE DROPS OP) Place 1 drop into both eyes 3 (three) times daily as needed (dry eyes).     carvedilol (COREG) 6.25 MG tablet Take 1 tablet (6.25 mg total) by mouth 2 (two) times daily with a meal. Hold on mornings you have dialysis and the night before 180 tablet 3   ciclopirox (PENLAC) 8 % solution as directed.     erythromycin ophthalmic ointment Place 1 application into the left eye at bedtime.     gabapentin (NEURONTIN) 300 MG capsule TAKE 1 CAPSULE BY MOUTH AT BEDTIME FOR 7 DAYS AND THEN 2 CAPSULES DAILY (MORNING  AND  BEDTIME)  THEREAFTER 90 capsule 0   glucose blood (FREESTYLE TEST STRIPS) test strip Use to check blood sugar daily 100 each 3   glucose monitoring kit (FREESTYLE) monitoring kit USE TO MONITOR BLOOD GLUCOSE AS DIRECTED 1 each 1   lansoprazole (PREVACID) 15 MG capsule Take 1 tablet (24m) daily 90 capsule 0   lidocaine (XYLOCAINE) 5 % ointment Apply 1 application topically as needed. 35.44  g 5   Methoxy PEG-Epoetin Beta (MIRCERA IJ) as directed.     mirtazapine (REMERON) 7.5 MG tablet Take 0.5-1 tablets (3.75-7.5 mg total) by mouth at bedtime. 30 tablet 5   multivitamin (RENA-VIT) TABS  tablet Take 1 tablet by mouth daily. 90 tablet 3   nitroGLYCERIN (NITROSTAT) 0.4 MG SL tablet Place 1 tablet (0.4 mg total) under the tongue every 5 (five) minutes as needed for chest pain. 2 doses before calling for emergent help 25 tablet 3   oxymetazoline (AFRIN) 0.05 % nasal spray Place 1 spray into both nostrils 2 (two) times daily as needed for congestion.     saccharomyces boulardii (FLORASTOR) 250 MG capsule Take 1 capsule (250 mg total) by mouth 2 (two) times daily. 30 capsule 0   sertraline (ZOLOFT) 100 MG tablet TAKE 1 & 1/2 (ONE & ONE-HALF) TABLETS BY MOUTH ONCE DAILY 135 tablet 0   sevelamer carbonate (RENVELA) 800 MG tablet Take 1,600 mg by mouth 3 (three) times daily.     silver sulfADIAZINE (SILVADENE) 1 % cream Apply pea-sized amount to wound daily. 50 g 0   SPS 15 GM/60ML suspension as directed.     triamcinolone cream (KENALOG) 0.1 % Apply 1 application topically as directed.     vitamin B-12 (CYANOCOBALAMIN) 1000 MCG tablet Take 1,000 mcg by mouth daily.     sevelamer carbonate (RENVELA) 800 MG tablet Take 2 tablets (1,600 mg total) by mouth in the morning and at bedtime. (Patient not taking: Reported on 08/26/2021) 360 tablet 3   No current facility-administered medications for this visit.   Allergies  Allergen Reactions   Bee Venom Anaphylaxis   Lyrica [Pregabalin] Other (See Comments)    hallucinations   Prednisone Other (See Comments)    hallucinations   Zocor [Simvastatin] Nausea Only and Other (See Comments)    Headache with brand name only.  Can take the generic.   Review of Systems negative except from HPI and PMH  Physical Exam: BP (!) 132/50    Pulse 80    Ht '5\' 7"'  (1.702 m)    Wt 215 lb 12.8 oz (97.9 kg)    SpO2 96%    BMI 33.80 kg/m  Well developed and well  nourished in no acute distress HENT normal Neck supple with JVP-flat Clear Device pocket well healed; without hematoma or erythema.  There is no tethering  Regular rate and rhythm, no murmur Abd-soft with active BS No Clubbing cyanosis tr edema Skin-warm and dry A & Oriented  Grossly normal sensory and motor function  ECG sinus rhythm with P synchronous pacing with an upright QRS lead V1 and a negative QRS lead I  Assessment and  Plan Complete heart block  Ischemic heart disease with prior MI  ICD-CRT      CHF chronic diastolic  Renal insufficiency ESRD  SCAF   End of life discussion   Lengthy discussion today regarding device generator at end of life.  The options in this very old gentleman with end-stage renal disease on dialysis is whether 1-the device is left in place and battery allow to drain.  2-to change out the high-voltage system for a low voltage pacer given his complete heart block and the third is to consider high-voltage generator replacement.  Against the latter are his end-stage renal disease and his extreme age which attenuate significantly the benefits of ICD therapy.  No angina continue him on aspirin, carvedilol 6.25

## 2021-08-26 NOTE — Patient Instructions (Signed)
Medication Instructions:  Your physician recommends that you continue on your current medications as directed. Please refer to the Current Medication list given to you today.   *If you need a refill on your cardiac medications before your next appointment, please call your pharmacy*   Lab Work: None ordered.  If you have labs (blood work) drawn today and your tests are completely normal, you will receive your results only by: [place] (if you have MyChart) OR A paper copy in the mail If you have any lab test that is abnormal or we need to change your treatment, we will call you to review the results.   Testing/Procedures: None ordered.    Follow-Up: At [facility], you and your health needs are our priority.  As part of our continuing mission to provide you with exceptional heart care, we have created designated Provider Care Teams.  These Care Teams include your primary Cardiologist (physician) and Advanced Practice Providers (APPs -  Physician Assistants and Nurse Practitioners) who all work together to provide you with the care you need, when you need it.  We recommend signing up for the patient portal called "MyChart".  Sign up information is provided on this After Visit Summary.  MyChart is used to connect with patients for Virtual Visits (Telemedicine).  Patients are able to view lab/test results, encounter notes, upcoming appointments, etc.  Non-urgent messages can be sent to your provider as well.   To learn more about what you can do with MyChart, go to [email or web address].    Your next appointment:   To be determined

## 2021-08-27 ENCOUNTER — Other Ambulatory Visit: Payer: Self-pay | Admitting: Family Medicine

## 2021-08-27 DIAGNOSIS — F32A Depression, unspecified: Secondary | ICD-10-CM

## 2021-08-27 LAB — CUP PACEART INCLINIC DEVICE CHECK
Battery Remaining Longevity: 1 mo — CL
Battery Voltage: 2.7 V
Brady Statistic AP VP Percent: 83.61 %
Brady Statistic AP VS Percent: 0.03 %
Brady Statistic AS VP Percent: 16.31 %
Brady Statistic AS VS Percent: 0.04 %
Brady Statistic RA Percent Paced: 83.51 %
Brady Statistic RV Percent Paced: 99.8 %
Date Time Interrogation Session: 20230214163000
HighPow Impedance: 66 Ohm
Implantable Lead Implant Date: 20100112
Implantable Lead Implant Date: 20170313
Implantable Lead Implant Date: 20170313
Implantable Lead Location: 753858
Implantable Lead Location: 753859
Implantable Lead Location: 753860
Implantable Lead Model: 4598
Implantable Lead Model: 5076
Implantable Pulse Generator Implant Date: 20170313
Lead Channel Impedance Value: 399 Ohm
Lead Channel Impedance Value: 418 Ohm
Lead Channel Impedance Value: 418 Ohm
Lead Channel Impedance Value: 456 Ohm
Lead Channel Impedance Value: 456 Ohm
Lead Channel Impedance Value: 513 Ohm
Lead Channel Impedance Value: 532 Ohm
Lead Channel Impedance Value: 532 Ohm
Lead Channel Impedance Value: 722 Ohm
Lead Channel Impedance Value: 760 Ohm
Lead Channel Impedance Value: 817 Ohm
Lead Channel Impedance Value: 836 Ohm
Lead Channel Impedance Value: 836 Ohm
Lead Channel Pacing Threshold Amplitude: 0.5 V
Lead Channel Pacing Threshold Amplitude: 1.25 V
Lead Channel Pacing Threshold Amplitude: 1.25 V
Lead Channel Pacing Threshold Pulse Width: 0.4 ms
Lead Channel Pacing Threshold Pulse Width: 0.4 ms
Lead Channel Pacing Threshold Pulse Width: 0.4 ms
Lead Channel Sensing Intrinsic Amplitude: 1 mV
Lead Channel Sensing Intrinsic Amplitude: 1.125 mV
Lead Channel Sensing Intrinsic Amplitude: 12.375 mV
Lead Channel Sensing Intrinsic Amplitude: 12.375 mV
Lead Channel Setting Pacing Amplitude: 2.5 V
Lead Channel Setting Pacing Amplitude: 2.5 V
Lead Channel Setting Pacing Amplitude: 3 V
Lead Channel Setting Pacing Pulse Width: 0.4 ms
Lead Channel Setting Pacing Pulse Width: 0.4 ms
Lead Channel Setting Sensing Sensitivity: 0.3 mV

## 2021-08-28 ENCOUNTER — Ambulatory Visit: Payer: PPO | Admitting: Rehabilitative and Restorative Service Providers"

## 2021-08-28 ENCOUNTER — Encounter: Payer: Self-pay | Admitting: Rehabilitative and Restorative Service Providers"

## 2021-08-28 ENCOUNTER — Encounter: Payer: Self-pay | Admitting: Podiatry

## 2021-08-28 ENCOUNTER — Other Ambulatory Visit: Payer: Self-pay

## 2021-08-28 DIAGNOSIS — R2689 Other abnormalities of gait and mobility: Secondary | ICD-10-CM

## 2021-08-28 DIAGNOSIS — R296 Repeated falls: Secondary | ICD-10-CM

## 2021-08-28 DIAGNOSIS — M6281 Muscle weakness (generalized): Secondary | ICD-10-CM | POA: Diagnosis not present

## 2021-08-28 NOTE — Therapy (Addendum)
[facility] [address] [place], [place], [address] Phone: [phone]   Fax:  [phone]  Physical Therapy Treatment and Late Entry Discharge Summary  Patient Details  Name: Rodney Farley MRN: [id number] Date of Birth: 1932-05-25 Referring Provider (PT): Dr. Tana Conch   Encounter Date: 08/28/2021   PT End of Session - 08/28/21 1148     Visit Number 11    Date for PT Re-Evaluation 09/11/21    Authorization Type [employer] 20th visit progress note    Progress Note Due on Visit 20    PT Start Time 1145    PT Stop Time 1225    PT Time Calculation (min) 40 min    Activity Tolerance Patient tolerated treatment well    Behavior During Therapy [facility] for tasks assessed/performed             Past Medical History:  Diagnosis Date   AICD (automatic cardioverter/defibrillator) present    AKI (acute kidney injury) (HCC) 03/27/2017   Anemia    Anginal pain (HCC)    Anxiety    Arm pain 05/08/2015   LEFT ARM   Arthritis    Atrioventricular block, complete (HCC)    a. 2010 s/p pacemaker.   Bell's palsy    left side. after shingles episode   Bilateral renal cysts 07/23/2017   Simple and hemorrhagic noted on CT ab/pelvis    BPH associated with nocturia    Cardiomegaly    Chronic systolic CHF (congestive heart failure) (HCC)    EF normalized by Echo 2019   CKD (chronic kidney disease), stage IV (HCC)    COPD (chronic obstructive pulmonary disease) (HCC)    Severe   Coronary artery disease    a. s/p MI in 1994/1995 while in [place] s/p questionable PCI. 03/2015: progression of disease, for staged PCI.   Depression    Diabetic peripheral neuropathy (HCC)    Diarrhea    Diverticulitis    DOE (dyspnea on exertion) 03/28/2015   Full dentures    Gallstones    GERD (gastroesophageal reflux disease)    Gout    Hard of hearing    B/L   Heart murmur    History of chronic pancreatitis  07/23/2017   noted on CT abd/pelvis   History of shingles    Hypercholesterolemia    Hypertension    Ischemic cardiomyopathy    MI (myocardial infarction) (HCC) 1994; 1995   Neuropathy    IN LOWER EXTREMITIES   Nosebleed 10/06/2017   for 2 months most recent 10/06/2017   Obesity    Pacemaker    medtronic>>> MDT ICD 09/23/15   Ruptured appendicitis    Sleep apnea    "sleeps w/humidifyer when he panics and gets short of breath" (04/08/2015)   TIA (transient ischemic attack) X 3   Trigger middle finger of left hand    Type II diabetes mellitus (HCC)    Wears glasses    Wears hearing aid     Past Surgical History:  Procedure Laterality Date   APPENDECTOMY     AV FISTULA PLACEMENT Right 02/07/2019   Procedure: ARTERIOVENOUS (AV) FISTULA CREATION RIGHT UPPER ARM;  Surgeon: Maeola Harman, MD;  Location: [facility] OR;  Service: Vascular;  Laterality: Right;   CARDIAC CATHETERIZATION N/A 03/29/2015   Procedure: Right/Left Heart Cath and Coronary Angiography;  Surgeon: Nyjah M Swaziland, MD;  [facility] INVASIVE CV LAB;  Service: Cardiovascular;  Laterality: N/A;   CARDIAC  CATHETERIZATION  1995   "after my MI; put me on heart RX after cath"   CARDIAC CATHETERIZATION N/A 04/09/2015   Procedure: Coronary Stent Intervention;  Surgeon: Habeeb M Swaziland, MD;  Location: [facility] INVASIVE CV LAB;  Service: Cardiovascular;  Laterality: N/A;   CATARACT EXTRACTION W/ INTRAOCULAR LENS  IMPLANT, BILATERAL     CHOLECYSTECTOMY N/A 10/13/2017   Procedure: LAPAROSCOPIC CHOLECYSTECTOMY WITH LYSIS OF ADHESIONS;  Surgeon: Andria Meuse, MD;  Location: [facility] ORS;  Service: General;  Laterality: N/A;   COLONOSCOPY     DENTAL SURGERY     EP IMPLANTABLE DEVICE N/A 09/23/2015   MDT CRT-D, Dr. Graciela Husbands   HIATAL HERNIA REPAIR  1977   ILEOCECETOMY N/A 03/27/2017   Procedure: ILEOCECECTOMY;  Surgeon: Andria Meuse, MD;  Location: [facility] OR;  Service: General;  Laterality: N/A;   INSERT / REPLACE / REMOVE PACEMAKER   07/2008   Complete heart block status post DDD with good function   IR FLUORO GUIDE CV LINE RIGHT  04/03/2019   IR US GUIDE VASC ACCESS RIGHT  04/03/2019   LAPAROTOMY N/A 03/27/2017   Procedure: EXPLORATORY LAPAROTOMY;  Surgeon: Andria Meuse, MD;  Location: [facility] OR;  Service: General;  Laterality: N/A;   TONSILLECTOMY     UPPER GI ENDOSCOPY      There were no vitals filed for this visit.   Subjective Assessment - 08/28/21 1149     Subjective Pt reports that he is feeling better. Pt states that he will need to get his pacemaker replaced soon.    Pertinent History dialysis Mon, Wed, Fri;  pacemaker with defibrillator; DN, COPD, HTN; DM; neuropathy;  RIGHT ARM FISTULA    Patient Stated Goals I hope we might proceed with practice to get up off the floor and gain strength in my leg; stoop to clean oven/stove    Currently in Pain? No/denies                               [facility] Adult PT Treatment/Exercise - 08/28/21 0001       Lumbar Exercises: Standing   Other Standing Lumbar Exercises alt LE tapping 2 cones 2x5 B with cuing for tapping both cones before putting placing foot back down with min A/close assist.      Lumbar Exercises: Seated   Other Seated Lumbar Exercises seated 5# kettlebell pull ups/bent over rows 10x B    Other Seated Lumbar Exercises seated sit ups 5# 10x.  Seated trunk rotation with 5# x10B.      Lumbar Exercises: Quadruped   Other Quadruped Lumbar Exercises From quadruped to high kneeling x10 reps      Knee/Hip Exercises: Aerobic   Nustep L4 8 min while discussing status      Knee/Hip Exercises: Standing   Lateral Step Up Both;1 set;5 sets;Hand Hold: 1;Step Height: 6"    Forward Step Up Both;1 set;5 sets;Hand Hold: 0;Step Height: 6"      Knee/Hip Exercises: Seated   Sit to Sand 1 set;10 reps   holding 5# weight, cuing for core stability                      PT Short Term Goals - 08/21/21 1742       PT SHORT TERM GOAL #1    Title be independent in initial HEP    Status Achieved      PT SHORT TERM GOAL #2  Title perform TUG in < or = to 27 seconds to improve mobility and improve balance    Status Achieved      PT SHORT TERM GOAL #3   Title improve LE strength to perform sit to stand 5x in 20 sec    Status Achieved      PT SHORT TERM GOAL #4   Title BERG score improved to 28/56 indicating improved balance and safety    Status Achieved               PT Long Term Goals - 08/28/21 1228       PT LONG TERM GOAL #1   Title be independent in advanced HEP    Status On-going      PT LONG TERM GOAL #4   Title demonstrate 4/5 bil LE strength to improve safety and ability to stoop/squat and to help with getting up off the floor    Status On-going                   Plan - 08/28/21 1225     Clinical Impression Statement Rodney Farley continues to progress towards goals.  He states that yesterday morning, he had lost his hearing aide and he had to get down on his hands and knees to look for it and he was very pleased that he was able to return to standing without calling his son.  Pt tolerated session well, but did have some dyspnea during session requiring seated recovery periods and cuing for pursed lip breathing. Pt with biggest balance challenge with double cone tap activity requiring min A throughout that activity to prevent loss of balance.  Pt continues to require skilled PT to progress towards goal related activities.    Personal Factors and Comorbidities Comorbidity 3+    Comorbidities CKD with dialysis Mon, Wed,Fri; RIGHT ARM FISTULA;  DM;  pacemaker/defibrillator; COPD; HTN; neuropathy    PT Treatment/Interventions ADLs/Self Care Home Management;Therapeutic activities;Neuromuscular re-education;Therapeutic exercise;Manual techniques;Patient/family education;Stair training;Gait training;Functional mobility training    PT Next Visit Plan Nu-Step; sit to stands; standing ex's for balance and  strength; seated core and LE strengthening; 90 degree turns;  step ups; quadruped crawling on/off mat table; rising from low seat; 1/2 kneel to rise    PT Home Exercise Plan Access Code [facility]    Consulted and Agree with Plan of Care Patient             Patient will benefit from skilled therapeutic intervention in order to improve the following deficits and impairments:  Difficulty walking, Pain, Decreased activity tolerance, Impaired perceived functional ability, Decreased balance, Decreased strength, Decreased mobility  Visit Diagnosis: Muscle weakness (generalized)  Other abnormalities of gait and mobility  Repeated falls     Problem List Patient Active Problem List   Diagnosis Date Noted   Atrial fibrillation (HCC) - SCAF 08/26/2021   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 07/29/2020   Dependence on renal dialysis (HCC) 11/13/2019   Coagulation defect, unspecified (HCC) 05/11/2019   Encounter for immunization 04/25/2019   Thrombocytopenia (HCC) 04/13/2019   Hypokalemia 04/12/2019   Anaphylactic shock, unspecified, initial encounter 04/10/2019   Anemia in chronic kidney disease 04/10/2019   Complication of vascular dialysis catheter 04/10/2019   Iron deficiency anemia, unspecified 04/10/2019   Secondary hyperparathyroidism of renal origin (HCC) 04/10/2019   Hyperkalemia, diminished renal excretion 04/01/2019   Hyperlipidemia associated with type 2 diabetes mellitus (HCC) 04/13/2018   S/P cholecystectomy 10/13/2017   Diarrhea 05/11/2017  Ruptured appendicitis 03/26/2017   Marital stress 02/18/2017   Trigger finger 06/10/2016   BPH associated with nocturia 04/27/2016   Diabetic peripheral neuropathy (HCC)    COPD (chronic obstructive pulmonary disease) (HCC)    Bell's palsy    Gout 06/26/2015   Chronic systolic CHF (congestive heart failure) (HCC) 04/08/2015   Diverticulitis 04/08/2015   DOE (dyspnea on exertion), due to significant CAD  03/28/2015   ESRD on hemodialysis (HCC) 03/04/2015   CKD (chronic kidney disease) stage 4, GFR 15-29 ml/min (HCC) 03/04/2015   CAD (coronary artery disease) 09/13/2014   GERD (gastroesophageal reflux disease) 08/07/2012   Major depression, recurrent, full remission (HCC) 08/05/2012   Sleep apnea 08/05/2012   Controlled type 2 diabetes mellitus with chronic kidney disease on chronic dialysis (HCC) 08/05/2012   Hypertension associated with diabetes (HCC) 08/05/2012   Diabetes type 2, uncontrolled 08/05/2012   Ischemic cardiomyopathy 11/03/2011   Atrioventricular block, complete (HCC)    Obesity    Pacemaker      PHYSICAL THERAPY DISCHARGE SUMMARY  As of 11/26/21, pt has not returned for further PT visits and discharged at this time.  Patient agrees to discharge. Patient goals were partially met. Patient is being discharged due to not returning since the last visit.   Reather Laurence, PT, DPT 08/28/2021, 12:29 PM  [facility] [address] [place], [place], [address] Phone: [phone]   Fax:  [phone]  Name: Rodney Farley MRN: [id number] Date of Birth: 10/26/1931

## 2021-09-01 ENCOUNTER — Telehealth: Payer: Self-pay | Admitting: Internal Medicine

## 2021-09-01 ENCOUNTER — Other Ambulatory Visit: Payer: Self-pay | Admitting: Family Medicine

## 2021-09-01 DIAGNOSIS — F32A Depression, unspecified: Secondary | ICD-10-CM

## 2021-09-01 NOTE — Telephone Encounter (Signed)
Patient's son is calling to go over was discussed last week with Dr. Caryl Comes. He said he father wants to go with a pacer maker and defib. He would like to get that scheduled.  Son also has form he would like to drop off to have Dr. Caryl Comes fill out and sign.

## 2021-09-02 MED ORDER — SERTRALINE HCL 100 MG PO TABS: ORAL_TABLET | ORAL | 0 refills | Status: DC

## 2021-09-02 NOTE — Telephone Encounter (Signed)
Spoke with pt's son, Paul,DPR who states pt would like to proceed with ICD generator change.  Advised once procedure is scheduled will contact pt with instructions.  Pt's son verbalizes understanding and agrees with current plan.  Pt's son also requesting Dr Caryl Comes sign pt's MOST form for advanced directive.  Requested pt's son drop off form for Dr Olin Pia review.  Pt's son verbalizes understanding and agrees with current plan.

## 2021-09-03 NOTE — Telephone Encounter (Signed)
Spoke with pt's son, [name], [place] and advised MOST form signed by Dr Caryl Comes and ready for pick up.  Pt's son verbalizes understanding will pick up form today or tomorrow.

## 2021-09-03 NOTE — Telephone Encounter (Addendum)
Patient's son [name] returned your call, he can be reached at [phone].

## 2021-09-03 NOTE — Telephone Encounter (Signed)
Attempted phone call to pt's son to advise Dr Caryl Comes has signed pt's MOST form and it is ready to be picked up. Advised to contact RN at [phone].

## 2021-09-04 DIAGNOSIS — I255 Ischemic cardiomyopathy: Secondary | ICD-10-CM

## 2021-09-04 DIAGNOSIS — I480 Paroxysmal atrial fibrillation: Secondary | ICD-10-CM

## 2021-09-04 DIAGNOSIS — Z01812 Encounter for preprocedural laboratory examination: Secondary | ICD-10-CM

## 2021-09-04 DIAGNOSIS — I5022 Chronic systolic (congestive) heart failure: Secondary | ICD-10-CM

## 2021-09-04 NOTE — Telephone Encounter (Signed)
Pt's son, Paul,DPR in office to pick up MOST form.  Pt scheduled for generator change on 10/09/2021.  Instruction letter reviewed with pt's son.  See letter for complete details.  Surgical scrub and Preparing for Surgery sheet provided and reviewed as well.  Pt's son verbalizes understanding and agrees with current plan.

## 2021-09-18 ENCOUNTER — Ambulatory Visit (INDEPENDENT_AMBULATORY_CARE_PROVIDER_SITE_OTHER): Payer: PPO

## 2021-09-18 DIAGNOSIS — I442 Atrioventricular block, complete: Secondary | ICD-10-CM

## 2021-09-18 LAB — CUP PACEART REMOTE DEVICE CHECK
Battery Remaining Longevity: 1 mo — CL
Battery Voltage: 2.67 V
Brady Statistic AP VP Percent: 69.43 %
Brady Statistic AP VS Percent: 0.01 %
Brady Statistic AS VP Percent: 30.54 %
Brady Statistic AS VS Percent: 0.02 %
Brady Statistic RA Percent Paced: 69.32 %
Brady Statistic RV Percent Paced: 99.84 %
Date Time Interrogation Session: 20230309001702
HighPow Impedance: 74 Ohm
Implantable Lead Implant Date: 20100112
Implantable Lead Implant Date: 20170313
Implantable Lead Implant Date: 20170313
Implantable Lead Location: 753858
Implantable Lead Location: 753859
Implantable Lead Location: 753860
Implantable Lead Model: 4598
Implantable Lead Model: 5076
Implantable Pulse Generator Implant Date: 20170313
Lead Channel Impedance Value: 418 Ohm
Lead Channel Impedance Value: 456 Ohm
Lead Channel Impedance Value: 456 Ohm
Lead Channel Impedance Value: 513 Ohm
Lead Channel Impedance Value: 513 Ohm
Lead Channel Impedance Value: 513 Ohm
Lead Channel Impedance Value: 532 Ohm
Lead Channel Impedance Value: 608 Ohm
Lead Channel Impedance Value: 779 Ohm
Lead Channel Impedance Value: 817 Ohm
Lead Channel Impedance Value: 874 Ohm
Lead Channel Impedance Value: 874 Ohm
Lead Channel Impedance Value: 893 Ohm
Lead Channel Pacing Threshold Amplitude: 0.5 V
Lead Channel Pacing Threshold Amplitude: 1.375 V
Lead Channel Pacing Threshold Amplitude: 1.375 V
Lead Channel Pacing Threshold Pulse Width: 0.4 ms
Lead Channel Pacing Threshold Pulse Width: 0.4 ms
Lead Channel Pacing Threshold Pulse Width: 0.4 ms
Lead Channel Sensing Intrinsic Amplitude: 1.125 mV
Lead Channel Sensing Intrinsic Amplitude: 1.125 mV
Lead Channel Sensing Intrinsic Amplitude: 12.375 mV
Lead Channel Sensing Intrinsic Amplitude: 12.375 mV
Lead Channel Setting Pacing Amplitude: 2.5 V
Lead Channel Setting Pacing Amplitude: 2.75 V
Lead Channel Setting Pacing Amplitude: 3 V
Lead Channel Setting Pacing Pulse Width: 0.4 ms
Lead Channel Setting Pacing Pulse Width: 0.4 ms
Lead Channel Setting Sensing Sensitivity: 0.3 mV

## 2021-10-01 NOTE — Progress Notes (Signed)
Remote ICD transmission.   

## 2021-10-02 ENCOUNTER — Other Ambulatory Visit: Payer: Self-pay

## 2021-10-02 ENCOUNTER — Other Ambulatory Visit: Payer: PPO

## 2021-10-02 DIAGNOSIS — I480 Paroxysmal atrial fibrillation: Secondary | ICD-10-CM

## 2021-10-02 DIAGNOSIS — I255 Ischemic cardiomyopathy: Secondary | ICD-10-CM

## 2021-10-02 DIAGNOSIS — I5022 Chronic systolic (congestive) heart failure: Secondary | ICD-10-CM

## 2021-10-02 DIAGNOSIS — Z01812 Encounter for preprocedural laboratory examination: Secondary | ICD-10-CM

## 2021-10-02 LAB — BASIC METABOLIC PANEL
BUN/Creatinine Ratio: 5 — ABNORMAL LOW (ref 10–24)
BUN: 30 mg/dL — ABNORMAL HIGH (ref 8–27)
CO2: 30 mmol/L — ABNORMAL HIGH (ref 20–29)
Calcium: 9.7 mg/dL (ref 8.6–10.2)
Chloride: 95 mmol/L — ABNORMAL LOW (ref 96–106)
Creatinine, Ser: 5.73 mg/dL — ABNORMAL HIGH (ref 0.76–1.27)
Glucose: 188 mg/dL — ABNORMAL HIGH (ref 70–99)
Potassium: 4.9 mmol/L (ref 3.5–5.2)
Sodium: 138 mmol/L (ref 134–144)
eGFR: 9 mL/min/{1.73_m2} — ABNORMAL LOW (ref >59)

## 2021-10-02 LAB — CBC
Hematocrit: 34.4 % — ABNORMAL LOW (ref 37.5–51.0)
Hemoglobin: 11.5 g/dL — ABNORMAL LOW (ref 13.0–17.7)
MCH: 33.3 pg — ABNORMAL HIGH (ref 26.6–33.0)
MCHC: 33.4 g/dL (ref 31.5–35.7)
MCV: 100 fL — ABNORMAL HIGH (ref 79–97)
Platelets: 154 10*3/uL (ref 150–450)
RBC: 3.45 x10E6/uL — ABNORMAL LOW (ref 4.14–5.80)
RDW: 14.2 % (ref 11.6–15.4)
WBC: 9.1 10*3/uL (ref 3.4–10.8)

## 2021-10-08 NOTE — Pre-Procedure Instructions (Signed)
Attempted to call patient Left voice mail on the following items: ?Arrival time is now 0930, not 0530 ?Nothing to eat or drink after midnight ?No meds AM of procedure ?Responsible person to drive you home and stay with you for 24 hrs ?Wash with special soap night before and morning of procedure ?  ? ? ?

## 2021-10-09 ENCOUNTER — Other Ambulatory Visit: Payer: Self-pay

## 2021-10-09 ENCOUNTER — Encounter (HOSPITAL_COMMUNITY)
Admission: RE | Disposition: A | Discharge status: Home / Self Care | Payer: PPO | Source: Home / Self Care | Attending: Internal Medicine

## 2021-10-09 ENCOUNTER — Ambulatory Visit (HOSPITAL_COMMUNITY)
Admission: RE | Admit: 2021-10-09 | Discharge: 2021-10-09 | Disposition: A | Discharge status: Home / Self Care | Payer: PPO | Attending: Internal Medicine | Admitting: Internal Medicine

## 2021-10-09 DIAGNOSIS — I132 Hypertensive heart and chronic kidney disease with heart failure and with stage 5 chronic kidney disease, or end stage renal disease: Secondary | ICD-10-CM | POA: Insufficient documentation

## 2021-10-09 DIAGNOSIS — I2510 Atherosclerotic heart disease of native coronary artery without angina pectoris: Secondary | ICD-10-CM | POA: Insufficient documentation

## 2021-10-09 DIAGNOSIS — N186 End stage renal disease: Secondary | ICD-10-CM | POA: Insufficient documentation

## 2021-10-09 DIAGNOSIS — I252 Old myocardial infarction: Secondary | ICD-10-CM | POA: Diagnosis not present

## 2021-10-09 DIAGNOSIS — I5042 Chronic combined systolic (congestive) and diastolic (congestive) heart failure: Secondary | ICD-10-CM | POA: Diagnosis not present

## 2021-10-09 DIAGNOSIS — E669 Obesity, unspecified: Secondary | ICD-10-CM | POA: Insufficient documentation

## 2021-10-09 DIAGNOSIS — E1142 Type 2 diabetes mellitus with diabetic polyneuropathy: Secondary | ICD-10-CM | POA: Diagnosis not present

## 2021-10-09 DIAGNOSIS — Z4502 Encounter for adjustment and management of automatic implantable cardiac defibrillator: Secondary | ICD-10-CM

## 2021-10-09 DIAGNOSIS — E1122 Type 2 diabetes mellitus with diabetic chronic kidney disease: Secondary | ICD-10-CM | POA: Diagnosis not present

## 2021-10-09 DIAGNOSIS — I255 Ischemic cardiomyopathy: Secondary | ICD-10-CM | POA: Diagnosis not present

## 2021-10-09 DIAGNOSIS — I442 Atrioventricular block, complete: Secondary | ICD-10-CM | POA: Diagnosis not present

## 2021-10-09 DIAGNOSIS — J449 Chronic obstructive pulmonary disease, unspecified: Secondary | ICD-10-CM | POA: Diagnosis not present

## 2021-10-09 DIAGNOSIS — K219 Gastro-esophageal reflux disease without esophagitis: Secondary | ICD-10-CM | POA: Diagnosis not present

## 2021-10-09 DIAGNOSIS — Z79899 Other long term (current) drug therapy: Secondary | ICD-10-CM | POA: Insufficient documentation

## 2021-10-09 DIAGNOSIS — Z7982 Long term (current) use of aspirin: Secondary | ICD-10-CM | POA: Insufficient documentation

## 2021-10-09 DIAGNOSIS — Z87891 Personal history of nicotine dependence: Secondary | ICD-10-CM | POA: Insufficient documentation

## 2021-10-09 DIAGNOSIS — Z6833 Body mass index (BMI) 33.0-33.9, adult: Secondary | ICD-10-CM | POA: Diagnosis not present

## 2021-10-09 DIAGNOSIS — I429 Cardiomyopathy, unspecified: Secondary | ICD-10-CM

## 2021-10-09 HISTORY — PX: BIV ICD GENERATOR CHANGEOUT: EP1194

## 2021-10-09 LAB — GLUCOSE, CAPILLARY: Glucose-Capillary: 207 mg/dL — ABNORMAL HIGH (ref 70–99)

## 2021-10-09 SURGERY — BIV ICD GENERATOR CHANGEOUT

## 2021-10-09 MED ORDER — SODIUM CHLORIDE 0.9 % IV SOLN: INTRAVENOUS | Status: DC

## 2021-10-09 MED ORDER — SODIUM CHLORIDE 0.9 % IV SOLN
INTRAVENOUS | Status: AC
  Filled 2021-10-09: qty 2

## 2021-10-09 MED ORDER — CHLORHEXIDINE GLUCONATE 4 % EX LIQD: 4.0000 "application " | Freq: Once | CUTANEOUS | Status: DC

## 2021-10-09 MED ORDER — SODIUM CHLORIDE 0.9 % IV SOLN
80.0000 mg | INTRAVENOUS | Status: AC
  Administered 2021-10-09: 80 mg

## 2021-10-09 MED ORDER — LIDOCAINE HCL (PF) 1 % IJ SOLN
INTRAMUSCULAR | Status: DC | PRN
  Administered 2021-10-09: 60 mL

## 2021-10-09 MED ORDER — CEFAZOLIN SODIUM-DEXTROSE 2-4 GM/100ML-% IV SOLN
INTRAVENOUS | Status: AC
  Filled 2021-10-09: qty 100

## 2021-10-09 MED ORDER — ACETAMINOPHEN 325 MG PO TABS
325.0000 mg | ORAL_TABLET | ORAL | Status: DC | PRN
  Filled 2021-10-09: qty 2

## 2021-10-09 MED ORDER — LIDOCAINE HCL (PF) 1 % IJ SOLN
INTRAMUSCULAR | Status: AC
  Filled 2021-10-09: qty 60

## 2021-10-09 MED ORDER — POVIDONE-IODINE 10 % EX SWAB
2.0000 "application " | Freq: Once | CUTANEOUS | Status: AC
  Administered 2021-10-09: 2 via TOPICAL

## 2021-10-09 MED ORDER — CEFAZOLIN SODIUM-DEXTROSE 2-4 GM/100ML-% IV SOLN
2.0000 g | INTRAVENOUS | Status: AC
  Administered 2021-10-09: 2 g via INTRAVENOUS

## 2021-10-09 SURGICAL SUPPLY — 9 items
CABLE SURGICAL S-101-97-12 (CABLE) ×1 IMPLANT
DEVICE DISSECT PLASMABLAD 3.0S (MISCELLANEOUS) IMPLANT
HEMOSTAT SURGICEL 2X4 FIBR (HEMOSTASIS) ×1 IMPLANT
ICD CLARIA MRI DTMA1QQ (ICD Generator) ×1 IMPLANT
PAD DEFIB RADIO PHYSIO CONN (PAD) ×1 IMPLANT
PLASMABLADE 3.0S (MISCELLANEOUS) ×2
POUCH AIGIS-R ANTIBACT ICD (Mesh General) ×2 IMPLANT
POUCH AIGIS-R ANTIBACT ICD LRG (Mesh General) IMPLANT
TRAY PACEMAKER INSERTION (PACKS) ×1 IMPLANT

## 2021-10-09 NOTE — H&P (Signed)
? ? ? ? ?Patient Care Team: ?[name], MD as PCP - General (Family Medicine) ?[name], Rana M, MD as PCP - Cardiology (Cardiology) ?Evelina Bucy, DPM as Consulting Physician (Podiatry) ?Debbra Riding, MD as Consulting Physician (Ophthalmology) ?Jamal Maes, MD as Consulting Physician (Nephrology) ?Deboraha Sprang, MD as Consulting Physician (Cardiology) ?Center, [facility] ? ? ?HPI ? ?Rodney Farley is a 88 y.o. male admitted today for generator replacement.  Underwent pacemaker 2010 implanted elsewhere, established care here 2013 and with [facility] underwent CRT-D upgrade 3/17. Device now at ERI  ?  ?He has history of ischemic heart disease with prior MI.  ?  ?Chronic dyspnea and cold.  No chest pain  ? ?Had reviewed extensively in the office the decision to proceed with HV v LV CRT generator replacement.  He has elected to undergo HV replacement and reiterates that request today  ?  ?DATE TEST EF    ?3/19 Echo  35-40%    ?12/16 Echo  25-30%    ?7/17 Echo   65 %    ?4/19 Echo   60-65 %    ?4/19 Myoview 45-50% Scar  ?  ?Date   Cr  K Hgb     ?11/20 5 5.0  9.9    ?4/21   4.5   11.6    ?3/23 ESRD 4.9 11.5    ?  ? ?Records and Results Reviewed  ? ?Past Medical History:  ?Diagnosis Date  ? AICD (automatic cardioverter/defibrillator) present   ? AKI (acute kidney injury) ([place]) 03/27/2017  ? Anemia   ? Anginal pain ([place])   ? Anxiety   ? Arm pain 05/08/2015  ? LEFT ARM  ? Arthritis   ? Atrioventricular block, complete (HCC)   ? a. 2010 s/p pacemaker.  ? Bell's palsy   ? left side. after shingles episode  ? Bilateral renal cysts 07/23/2017  ? Simple and hemorrhagic noted on CT ab/pelvis   ? BPH associated with nocturia   ? Cardiomegaly   ? Chronic systolic CHF (congestive heart failure) ([place])   ? EF normalized by Echo 2019  ? CKD (chronic kidney disease), stage IV ([place])   ? COPD (chronic obstructive pulmonary disease) ([place])   ? Severe  ? Coronary artery disease   ? a. s/p MI in  1994/1995 while in [place] s/p questionable PCI. 03/2015: progression of disease, for staged PCI.  ? Depression   ? Diabetic peripheral neuropathy ([place])   ? Diarrhea   ? Diverticulitis   ? DOE (dyspnea on exertion) 03/28/2015  ? Full dentures   ? Gallstones   ? GERD (gastroesophageal reflux disease)   ? Gout   ? Hard of hearing   ? B/L  ? Heart murmur   ? History of chronic pancreatitis 07/23/2017  ? noted on CT abd/pelvis  ? History of shingles   ? Hypercholesterolemia   ? Hypertension   ? Ischemic cardiomyopathy   ? MI (myocardial infarction) ([place]) 1994; 1995  ? Neuropathy   ? IN LOWER EXTREMITIES  ? Nosebleed 10/06/2017  ? for 2 months most recent 10/06/2017  ? Obesity   ? Pacemaker   ? medtronic>>> MDT ICD 09/23/15  ? Ruptured appendicitis   ? Sleep apnea   ? "sleeps w/humidifyer when he panics and gets short of breath" (04/08/2015)  ? TIA (transient ischemic attack) X 3  ? Trigger middle finger of left hand   ? Type II diabetes mellitus ([place])   ? Wears  glasses   ? Wears hearing aid   ? ? ?Past Surgical History:  ?Procedure Laterality Date  ? APPENDECTOMY    ? AV FISTULA PLACEMENT Right 02/07/2019  ? Procedure: ARTERIOVENOUS (AV) FISTULA CREATION RIGHT UPPER ARM;  Surgeon: Waynetta Sandy, MD;  Location: [place];  Service: Vascular;  Laterality: Right;  ? CARDIAC CATHETERIZATION N/A 03/29/2015  ? Procedure: Right/Left Heart Cath and Coronary Angiography;  Surgeon: Nikolaos M [name], MD;  Location: [place] CV LAB;  Service: Cardiovascular;  Laterality: N/A;  ? [place]  ? "after my MI; put me on heart RX after cath"  ? CARDIAC CATHETERIZATION N/A 04/09/2015  ? Procedure: Coronary Stent Intervention;  Surgeon: Duvid M [name], MD;  Location: [place] CV LAB;  Service: Cardiovascular;  Laterality: N/A;  ? CATARACT EXTRACTION W/ INTRAOCULAR LENS  IMPLANT, BILATERAL    ? CHOLECYSTECTOMY N/A 10/13/2017  ? Procedure: LAPAROSCOPIC CHOLECYSTECTOMY WITH LYSIS OF ADHESIONS;  Surgeon: Ileana Roup, MD;  Location: [facility] ORS;  Service: General;  Laterality: N/A;  ? COLONOSCOPY    ? DENTAL SURGERY    ? EP IMPLANTABLE DEVICE N/A 09/23/2015  ? MDT CRT-D, Dr. Caryl Comes  ? HIATAL HERNIA REPAIR  1977  ? ILEOCECETOMY N/A 03/27/2017  ? Procedure: ILEOCECECTOMY;  Surgeon: Ileana Roup, MD;  Location: [place];  Service: General;  Laterality: N/A;  ? INSERT / REPLACE / REMOVE PACEMAKER  07/2008  ? Complete heart block status post DDD with good function  ? IR FLUORO GUIDE CV LINE RIGHT  04/03/2019  ? IR US GUIDE VASC ACCESS RIGHT  04/03/2019  ? LAPAROTOMY N/A 03/27/2017  ? Procedure: EXPLORATORY LAPAROTOMY;  Surgeon: Ileana Roup, MD;  Location: [place];  Service: General;  Laterality: N/A;  ? TONSILLECTOMY    ? UPPER GI ENDOSCOPY    ? ? ?Current Facility-Administered Medications  ?Medication Dose Route Frequency Provider Last Rate Last Admin  ? 0.9 %  sodium chloride infusion   Intravenous Continuous Deboraha Sprang, MD 50 mL/hr at 10/09/21 1040 New Bag at 10/09/21 1040  ? ceFAZolin (ANCEF) IVPB 2g/100 mL premix  2 g Intravenous On Call Deboraha Sprang, MD      ? chlorhexidine (HIBICLENS) 4 % liquid 4 application.  4 application. Topical Once Deboraha Sprang, MD      ? gentamicin (GARAMYCIN) 80 mg in sodium chloride 0.9 % 500 mL irrigation  80 mg Irrigation On Call Deboraha Sprang, MD      ? ? ?Allergies  ?Allergen Reactions  ? Bee Venom Anaphylaxis  ? Lyrica [Pregabalin] Other (See Comments)  ?  hallucinations  ? Prednisone Other (See Comments)  ?  hallucinations  ? Zocor [Simvastatin] Nausea Only and Other (See Comments)  ?  Headache with brand name only.  Can take the generic.  ? ? ?  ?Social History  ? ?Tobacco Use  ? Smoking status: Former  ?  Packs/day: 1.50  ?  Years: 54.00  ?  Pack years: 81.00  ?  Types: Cigarettes  ?  Quit date: 07/18/2007  ?  Years since quitting: 14.2  ?  Passive exposure: Never  ? Smokeless tobacco: Never  ?Vaping Use  ? Vaping Use: Never used  ?Substance Use Topics  ? Alcohol use:  Yes  ?  Comment: rare  ? Drug use: No  ?  ? ?Family History  ?Problem Relation Age of Onset  ? Stroke Mother   ? Leukemia Father   ? Stroke  Sister   ? Heart attack Brother   ?  ? ?Current Meds  ?Medication Sig  ? aspirin EC 81 MG tablet Take 81 mg by mouth daily.  ? atorvastatin (LIPITOR) 40 MG tablet Take 1 tablet by mouth once daily  ? carbamide peroxide (DEBROX) 6.5 % OTIC solution Place 5 drops into both ears 2 (two) times daily.  ? Carboxymethylcellul-Glycerin (LUBRICATING EYE DROPS OP) Place 1 drop into both eyes 3 (three) times daily as needed (dry eyes).  ? carvedilol (COREG) 6.25 MG tablet Take 1 tablet (6.25 mg total) by mouth 2 (two) times daily with a meal. Hold on mornings you have dialysis and the night before  ? erythromycin ophthalmic ointment Place 1 application into the left eye at bedtime.  ? gabapentin (NEURONTIN) 300 MG capsule TAKE 1 CAPSULE BY MOUTH AT BEDTIME FOR 7 DAYS AND THEN 2 CAPSULES DAILY (MORNING  AND  BEDTIME)  THEREAFTER  ? lansoprazole (PREVACID) 15 MG capsule Take 1 tablet (15mg ) daily  ? Methoxy PEG-Epoetin Beta (MIRCERA IJ) as directed.  ? mirtazapine (REMERON) 7.5 MG tablet Take 0.5-1 tablets (3.75-7.5 mg total) by mouth at bedtime.  ? multivitamin (RENA-VIT) TABS tablet Take 1 tablet by mouth daily.  ? saccharomyces boulardii (FLORASTOR) 250 MG capsule Take 1 capsule (250 mg total) by mouth 2 (two) times daily.  ? sevelamer carbonate (RENVELA) 800 MG tablet Take 1,600 mg by mouth 3 (three) times daily.  ? triamcinolone cream (KENALOG) 0.1 % Apply 1 application topically as directed.  ? vitamin B-12 (CYANOCOBALAMIN) 1000 MCG tablet Take 1,000 mcg by mouth daily.  ? ? ? ?Review of Systems negative except from HPI and PMH ? ?Physical Exam ?BP (!) 145/62   Pulse 71   Temp 98.3 ?F (36.8 ?C) (Oral)   Resp 16   Ht 5\' 7"  (1.702 m)   Wt 97.1 kg   SpO2 97%   BMI 33.52 kg/m?  ?Well developed and well nourished in no acute distress ?HENT normal ?E scleral and icterus clear ?Neck  Supple ?JVP flat;   ?Clear to ausculation ?Regular rate and rhythm, no murmurs gallops or rub ?Soft   ?No clubbing cyanosis  Edema ?Alert and oriented, grossly normal motor and sensory function ?Skin Warm and Dry ? ? ? ?As

## 2021-10-09 NOTE — Progress Notes (Signed)
Spoke to Dr Caryl Comes and per Dr Caryl Comes client should hold ASA until Monday ?

## 2021-10-09 NOTE — Discharge Instructions (Addendum)
Please dialyze without heparin if possible for 4 sessions to try and reduce risk of bleeding in the pocket ; hold aspirin until Monday April 3 ?

## 2021-10-10 ENCOUNTER — Encounter (HOSPITAL_COMMUNITY): Payer: Self-pay | Admitting: Internal Medicine

## 2021-10-14 ENCOUNTER — Encounter: Payer: Self-pay | Admitting: Internal Medicine

## 2021-10-21 ENCOUNTER — Ambulatory Visit (INDEPENDENT_AMBULATORY_CARE_PROVIDER_SITE_OTHER): Payer: PPO

## 2021-10-21 DIAGNOSIS — I442 Atrioventricular block, complete: Secondary | ICD-10-CM | POA: Diagnosis not present

## 2021-10-21 LAB — CUP PACEART INCLINIC DEVICE CHECK
Date Time Interrogation Session: 20230411114524
Implantable Lead Implant Date: 20100112
Implantable Lead Implant Date: 20170313
Implantable Lead Implant Date: 20170313
Implantable Lead Location: 753858
Implantable Lead Location: 753859
Implantable Lead Location: 753860
Implantable Lead Model: 4598
Implantable Lead Model: 5076
Implantable Pulse Generator Implant Date: 20230330

## 2021-10-21 NOTE — Patient Instructions (Addendum)
? ?  After Your ICD ?(Implantable Cardiac Defibrillator) ? ? ? ?Monitor your defibrillator site for redness, swelling, and drainage. Call the device clinic at [phone] if you experience these symptoms or fever/chills. ? ?Continue to monitor swelling over your incision. It now feels soft when you palpate over the area, if you see that it becomes hard like a tennis ball or firm, please call the device clinic.  ? ?Your incision was closed with Dermabond:  You may shower 1 day after your defibrillator implant and wash your incision with soap and water. Avoid lotions, ointments, or perfumes over your incision until it is well-healed. ? ?You may use a hot tub or a pool after your wound check appointment if the incision is completely closed. ? ?Your ICD is designed to protect you from life threatening heart rhythms. Because of this, you may receive a shock.  ? ?1 shock with no symptoms:  Call the office during business hours. ?1 shock with symptoms (chest pain, chest pressure, dizziness, lightheadedness, shortness of breath, overall feeling unwell):  Call 911. ?If you experience 2 or more shocks in 24 hours:  Call 911. ?If you receive a shock, you should not drive.  ?[place] DMV - no driving for 6 months if you receive appropriate therapy from your ICD.  ? ?ICD Alerts:  Some alerts are vibratory and others beep. These are NOT emergencies. Please call our office to let us know. If this occurs at night or on weekends, it can wait until the next business day. Send a remote transmission. ? ?If your device is capable of reading fluid status (for heart failure), you will be offered monthly monitoring to review this with you.  ? ?Remote monitoring is used to monitor your ICD from home. This monitoring is scheduled every 91 days by our office. It allows [place] to keep an eye on the functioning of your device to ensure it is working properly. You will routinely see your Electrophysiologist annually (more often if necessary).  ?

## 2021-10-21 NOTE — Progress Notes (Signed)
Wound check appointment. Steri-strips removed. Wound without redness.  Soft hematoma noted. Dr. Curt Bears in to assess and patient has f/u apt 11/11/21 when Dr. Caryl Comes is in the office. Patient notes hematoma has not changed in size and aware to call if increased in size.  Incision edges approximated, wound well healed. Normal device function. Thresholds, sensing, and impedances consistent with implant measurements. Device programmed at auto for extra safety margin until 3 month visit. Histogram distribution appropriate for patient and level of activity. No mode switches or ventricular arrhythmias noted. Patient educated about wound care, arm mobility, shock plan. ROV in 3 months with implanting physician. ?

## 2021-10-22 ENCOUNTER — Ambulatory Visit: Payer: PPO

## 2021-10-23 ENCOUNTER — Other Ambulatory Visit: Payer: Self-pay | Admitting: Podiatry

## 2021-10-23 ENCOUNTER — Other Ambulatory Visit: Payer: Self-pay | Admitting: Family Medicine

## 2021-10-23 MED ORDER — GABAPENTIN 300 MG PO CAPS
ORAL_CAPSULE | ORAL | 0 refills | Status: DC
Start: 2021-10-23 — End: 2021-11-03

## 2021-10-23 NOTE — Telephone Encounter (Signed)
Please advise,former Dr Eleanora Neighbor patient but seeing you in May.

## 2021-11-03 ENCOUNTER — Other Ambulatory Visit: Payer: Self-pay | Admitting: Family Medicine

## 2021-11-03 ENCOUNTER — Other Ambulatory Visit: Payer: Self-pay | Admitting: Podiatry

## 2021-11-03 MED ORDER — ATORVASTATIN CALCIUM 40 MG PO TABS: 40.0000 mg | ORAL_TABLET | Freq: Every day | ORAL | 3 refills | Status: DC

## 2021-11-05 MED ORDER — GABAPENTIN 300 MG PO CAPS: ORAL_CAPSULE | ORAL | 0 refills | Status: DC

## 2021-11-07 ENCOUNTER — Telehealth: Payer: Self-pay | Admitting: Family Medicine

## 2021-11-07 DIAGNOSIS — F32A Depression, unspecified: Secondary | ICD-10-CM

## 2021-11-09 ENCOUNTER — Other Ambulatory Visit: Payer: Self-pay | Admitting: Family Medicine

## 2021-11-10 MED ORDER — LANSOPRAZOLE 15 MG PO CPDR
DELAYED_RELEASE_CAPSULE | ORAL | 0 refills | Status: DC
Start: 2021-11-10 — End: 2021-11-13

## 2021-11-10 NOTE — Telephone Encounter (Signed)
I dont mind this being refilled but we have not seen him in a year- needs follow up visit - then can give enough ot get to visit  ?

## 2021-11-11 ENCOUNTER — Ambulatory Visit: Payer: PPO

## 2021-11-11 ENCOUNTER — Emergency Department (HOSPITAL_COMMUNITY): Payer: PPO

## 2021-11-11 ENCOUNTER — Ambulatory Visit: Payer: PPO | Admitting: Podiatry

## 2021-11-11 ENCOUNTER — Encounter (HOSPITAL_COMMUNITY): Payer: Self-pay | Admitting: Emergency Medicine

## 2021-11-11 ENCOUNTER — Inpatient Hospital Stay (HOSPITAL_COMMUNITY)
Admission: EM | Admit: 2021-11-11 | Discharge: 2021-11-13 | DRG: 246 | Disposition: A | Discharge status: Home / Self Care | Payer: PPO | Attending: Cardiology | Admitting: Cardiology

## 2021-11-11 ENCOUNTER — Telehealth: Payer: Self-pay | Admitting: Internal Medicine

## 2021-11-11 ENCOUNTER — Other Ambulatory Visit: Payer: Self-pay

## 2021-11-11 ENCOUNTER — Inpatient Hospital Stay (HOSPITAL_COMMUNITY): Payer: PPO

## 2021-11-11 DIAGNOSIS — M109 Gout, unspecified: Secondary | ICD-10-CM | POA: Diagnosis present

## 2021-11-11 DIAGNOSIS — Z8673 Personal history of transient ischemic attack (TIA), and cerebral infarction without residual deficits: Secondary | ICD-10-CM

## 2021-11-11 DIAGNOSIS — Z992 Dependence on renal dialysis: Secondary | ICD-10-CM

## 2021-11-11 DIAGNOSIS — H9193 Unspecified hearing loss, bilateral: Secondary | ICD-10-CM | POA: Diagnosis present

## 2021-11-11 DIAGNOSIS — Z79899 Other long term (current) drug therapy: Secondary | ICD-10-CM

## 2021-11-11 DIAGNOSIS — E1169 Type 2 diabetes mellitus with other specified complication: Secondary | ICD-10-CM | POA: Diagnosis present

## 2021-11-11 DIAGNOSIS — E785 Hyperlipidemia, unspecified: Secondary | ICD-10-CM | POA: Diagnosis present

## 2021-11-11 DIAGNOSIS — Z888 Allergy status to other drugs, medicaments and biological substances status: Secondary | ICD-10-CM

## 2021-11-11 DIAGNOSIS — I25110 Atherosclerotic heart disease of native coronary artery with unstable angina pectoris: Secondary | ICD-10-CM | POA: Diagnosis present

## 2021-11-11 DIAGNOSIS — I5022 Chronic systolic (congestive) heart failure: Secondary | ICD-10-CM | POA: Diagnosis present

## 2021-11-11 DIAGNOSIS — Z950 Presence of cardiac pacemaker: Secondary | ICD-10-CM | POA: Diagnosis present

## 2021-11-11 DIAGNOSIS — I255 Ischemic cardiomyopathy: Secondary | ICD-10-CM | POA: Diagnosis present

## 2021-11-11 DIAGNOSIS — E875 Hyperkalemia: Secondary | ICD-10-CM | POA: Diagnosis present

## 2021-11-11 DIAGNOSIS — Z95810 Presence of automatic (implantable) cardiac defibrillator: Secondary | ICD-10-CM

## 2021-11-11 DIAGNOSIS — I350 Nonrheumatic aortic (valve) stenosis: Secondary | ICD-10-CM | POA: Diagnosis present

## 2021-11-11 DIAGNOSIS — I132 Hypertensive heart and chronic kidney disease with heart failure and with stage 5 chronic kidney disease, or end stage renal disease: Secondary | ICD-10-CM | POA: Diagnosis present

## 2021-11-11 DIAGNOSIS — K219 Gastro-esophageal reflux disease without esophagitis: Secondary | ICD-10-CM | POA: Diagnosis present

## 2021-11-11 DIAGNOSIS — D631 Anemia in chronic kidney disease: Secondary | ICD-10-CM | POA: Diagnosis present

## 2021-11-11 DIAGNOSIS — R0902 Hypoxemia: Secondary | ICD-10-CM | POA: Diagnosis present

## 2021-11-11 DIAGNOSIS — E1122 Type 2 diabetes mellitus with diabetic chronic kidney disease: Secondary | ICD-10-CM | POA: Diagnosis present

## 2021-11-11 DIAGNOSIS — Z9049 Acquired absence of other specified parts of digestive tract: Secondary | ICD-10-CM

## 2021-11-11 DIAGNOSIS — J449 Chronic obstructive pulmonary disease, unspecified: Secondary | ICD-10-CM | POA: Diagnosis present

## 2021-11-11 DIAGNOSIS — E669 Obesity, unspecified: Secondary | ICD-10-CM | POA: Diagnosis present

## 2021-11-11 DIAGNOSIS — Z87891 Personal history of nicotine dependence: Secondary | ICD-10-CM

## 2021-11-11 DIAGNOSIS — Z91030 Bee allergy status: Secondary | ICD-10-CM

## 2021-11-11 DIAGNOSIS — I152 Hypertension secondary to endocrine disorders: Secondary | ICD-10-CM | POA: Diagnosis present

## 2021-11-11 DIAGNOSIS — E1142 Type 2 diabetes mellitus with diabetic polyneuropathy: Secondary | ICD-10-CM | POA: Diagnosis present

## 2021-11-11 DIAGNOSIS — I10 Essential (primary) hypertension: Secondary | ICD-10-CM | POA: Diagnosis present

## 2021-11-11 DIAGNOSIS — E119 Type 2 diabetes mellitus without complications: Secondary | ICD-10-CM

## 2021-11-11 DIAGNOSIS — Z8249 Family history of ischemic heart disease and other diseases of the circulatory system: Secondary | ICD-10-CM

## 2021-11-11 DIAGNOSIS — I2510 Atherosclerotic heart disease of native coronary artery without angina pectoris: Secondary | ICD-10-CM | POA: Diagnosis present

## 2021-11-11 DIAGNOSIS — I214 Non-ST elevation (NSTEMI) myocardial infarction: Secondary | ICD-10-CM

## 2021-11-11 DIAGNOSIS — I252 Old myocardial infarction: Secondary | ICD-10-CM

## 2021-11-11 DIAGNOSIS — N186 End stage renal disease: Secondary | ICD-10-CM | POA: Diagnosis present

## 2021-11-11 DIAGNOSIS — E7800 Pure hypercholesterolemia, unspecified: Secondary | ICD-10-CM | POA: Diagnosis present

## 2021-11-11 DIAGNOSIS — Z635 Disruption of family by separation and divorce: Secondary | ICD-10-CM

## 2021-11-11 DIAGNOSIS — M1990 Unspecified osteoarthritis, unspecified site: Secondary | ICD-10-CM | POA: Diagnosis present

## 2021-11-11 DIAGNOSIS — G4733 Obstructive sleep apnea (adult) (pediatric): Secondary | ICD-10-CM | POA: Diagnosis present

## 2021-11-11 DIAGNOSIS — Z974 Presence of external hearing-aid: Secondary | ICD-10-CM

## 2021-11-11 DIAGNOSIS — Z7982 Long term (current) use of aspirin: Secondary | ICD-10-CM

## 2021-11-11 DIAGNOSIS — I442 Atrioventricular block, complete: Secondary | ICD-10-CM | POA: Diagnosis present

## 2021-11-11 DIAGNOSIS — Z6832 Body mass index (BMI) 32.0-32.9, adult: Secondary | ICD-10-CM

## 2021-11-11 DIAGNOSIS — Z961 Presence of intraocular lens: Secondary | ICD-10-CM | POA: Diagnosis present

## 2021-11-11 DIAGNOSIS — Z955 Presence of coronary angioplasty implant and graft: Secondary | ICD-10-CM

## 2021-11-11 LAB — T4, FREE: Free T4: 0.69 ng/dL (ref 0.61–1.12)

## 2021-11-11 LAB — BASIC METABOLIC PANEL
Anion gap: 18 — ABNORMAL HIGH (ref 5–15)
Anion gap: 13 (ref 5–15)
BUN: 26 mg/dL — ABNORMAL HIGH (ref 8–23)
BUN: 25 mg/dL — ABNORMAL HIGH (ref 8–23)
CO2: 25 mmol/L (ref 22–32)
CO2: 30 mmol/L (ref 22–32)
Calcium: 9.7 mg/dL (ref 8.9–10.3)
Calcium: 9.7 mg/dL (ref 8.9–10.3)
Chloride: 94 mmol/L — ABNORMAL LOW (ref 98–111)
Chloride: 94 mmol/L — ABNORMAL LOW (ref 98–111)
Creatinine, Ser: 7.25 mg/dL — ABNORMAL HIGH (ref 0.61–1.24)
Creatinine, Ser: 7.33 mg/dL — ABNORMAL HIGH (ref 0.61–1.24)
GFR, Estimated: 7 mL/min — ABNORMAL LOW (ref >60)
GFR, Estimated: 7 mL/min — ABNORMAL LOW (ref >60)
Glucose, Bld: 247 mg/dL — ABNORMAL HIGH (ref 70–99)
Glucose, Bld: 270 mg/dL — ABNORMAL HIGH (ref 70–99)
Potassium: >7.5 mmol/L (ref 3.5–5.1)
Potassium: 6 mmol/L — ABNORMAL HIGH (ref 3.5–5.1)
Sodium: 137 mmol/L (ref 135–145)
Sodium: 137 mmol/L (ref 135–145)

## 2021-11-11 LAB — CBC WITH DIFFERENTIAL/PLATELET
Abs Immature Granulocytes: 0.03 10*3/uL (ref 0.00–0.07)
Basophils Absolute: 0 10*3/uL (ref 0.0–0.1)
Basophils Relative: 1 %
Eosinophils Absolute: 0.2 10*3/uL (ref 0.0–0.5)
Eosinophils Relative: 3 %
HCT: 40.1 % (ref 39.0–52.0)
Hemoglobin: 13.3 g/dL (ref 13.0–17.0)
Immature Granulocytes: 0 %
Lymphocytes Relative: 33 %
Lymphs Abs: 2.6 10*3/uL (ref 0.7–4.0)
MCH: 34.5 pg — ABNORMAL HIGH (ref 26.0–34.0)
MCHC: 33.2 g/dL (ref 30.0–36.0)
MCV: 103.9 fL — ABNORMAL HIGH (ref 80.0–100.0)
Monocytes Absolute: 0.8 10*3/uL (ref 0.1–1.0)
Monocytes Relative: 10 %
Neutro Abs: 4.3 10*3/uL (ref 1.7–7.7)
Neutrophils Relative %: 53 %
Platelets: 96 10*3/uL — ABNORMAL LOW (ref 150–400)
RBC: 3.86 MIL/uL — ABNORMAL LOW (ref 4.22–5.81)
RDW: 15.3 % (ref 11.5–15.5)
Smear Review: DECREASED
WBC: 7.9 10*3/uL (ref 4.0–10.5)
nRBC: 0 % (ref 0.0–0.2)

## 2021-11-11 LAB — ECHOCARDIOGRAM COMPLETE
AR max vel: 1.32 cm2
AV Peak grad: 28 mmHg
Ao pk vel: 2.65 m/s
Area-P 1/2: 3.21 cm2
S' Lateral: 4 cm

## 2021-11-11 LAB — HEMOGLOBIN A1C
Hgb A1c MFr Bld: 7.5 % — ABNORMAL HIGH (ref 4.8–5.6)
Mean Plasma Glucose: 168.55 mg/dL

## 2021-11-11 LAB — HEPATIC FUNCTION PANEL
ALT: 25 U/L (ref 0–44)
ALT: 32 U/L (ref 0–44)
AST: 41 U/L (ref 15–41)
AST: 121 U/L — ABNORMAL HIGH (ref 15–41)
Albumin: 4 g/dL (ref 3.5–5.0)
Albumin: 3.9 g/dL (ref 3.5–5.0)
Alkaline Phosphatase: 113 U/L (ref 38–126)
Alkaline Phosphatase: 107 U/L (ref 38–126)
Bilirubin, Direct: 0.5 mg/dL — ABNORMAL HIGH (ref 0.0–0.2)
Bilirubin, Direct: 0.4 mg/dL — ABNORMAL HIGH (ref 0.0–0.2)
Indirect Bilirubin: 0.3 mg/dL (ref 0.3–0.9)
Indirect Bilirubin: 0.8 mg/dL (ref 0.3–0.9)
Total Bilirubin: 0.8 mg/dL (ref 0.3–1.2)
Total Bilirubin: 1.2 mg/dL (ref 0.3–1.2)
Total Protein: 7.4 g/dL (ref 6.5–8.1)
Total Protein: 7.4 g/dL (ref 6.5–8.1)

## 2021-11-11 LAB — TROPONIN I (HIGH SENSITIVITY)
Troponin I (High Sensitivity): 1581 ng/L (ref <18)
Troponin I (High Sensitivity): 4677 ng/L (ref <18)

## 2021-11-11 LAB — POTASSIUM: Potassium: 5.5 mmol/L — ABNORMAL HIGH (ref 3.5–5.1)

## 2021-11-11 LAB — MAGNESIUM: Magnesium: 2.2 mg/dL (ref 1.7–2.4)

## 2021-11-11 LAB — HEPATITIS B SURFACE ANTIGEN: Hepatitis B Surface Ag: NONREACTIVE

## 2021-11-11 LAB — LIPASE, BLOOD: Lipase: 46 U/L (ref 11–51)

## 2021-11-11 LAB — TSH: TSH: 1.4 u[IU]/mL (ref 0.350–4.500)

## 2021-11-11 MED ORDER — MIDODRINE HCL 5 MG PO TABS
10.0000 mg | ORAL_TABLET | Freq: Once | ORAL | Status: AC
  Administered 2021-11-11: 10 mg via ORAL

## 2021-11-11 MED ORDER — RENA-VITE PO TABS
1.0000 | ORAL_TABLET | Freq: Every day | ORAL | Status: DC
  Administered 2021-11-12 – 2021-11-13 (×2): 1 via ORAL
  Filled 2021-11-11 (×2): qty 1

## 2021-11-11 MED ORDER — PANTOPRAZOLE SODIUM 20 MG PO TBEC: 20.0000 mg | DELAYED_RELEASE_TABLET | Freq: Every day | ORAL | Status: DC

## 2021-11-11 MED ORDER — PANTOPRAZOLE SODIUM 40 MG PO TBEC
40.0000 mg | DELAYED_RELEASE_TABLET | Freq: Every day | ORAL | Status: DC
Start: 2021-11-11 — End: 2021-11-13
  Administered 2021-11-11 – 2021-11-13 (×3): 40 mg via ORAL
  Filled 2021-11-11 (×3): qty 1

## 2021-11-11 MED ORDER — SEVELAMER CARBONATE 800 MG PO TABS
1600.0000 mg | ORAL_TABLET | Freq: Three times a day (TID) | ORAL | Status: DC
  Administered 2021-11-11 – 2021-11-13 (×4): 1600 mg via ORAL
  Filled 2021-11-11 (×4): qty 2

## 2021-11-11 MED ORDER — SODIUM POLYSTYRENE SULFONATE 15 GM/60ML PO SUSP
15.0000 g | Freq: Once | ORAL | Status: AC
  Administered 2021-11-11: 15 g via ORAL
  Filled 2021-11-11: qty 60

## 2021-11-11 MED ORDER — CHLORHEXIDINE GLUCONATE CLOTH 2 % EX PADS
6.0000 | MEDICATED_PAD | Freq: Every day | CUTANEOUS | Status: DC
  Administered 2021-11-12: 6 via TOPICAL

## 2021-11-11 MED ORDER — SODIUM CHLORIDE 0.9 % IV SOLN: INTRAVENOUS | Status: DC

## 2021-11-11 MED ORDER — ASPIRIN 81 MG PO CHEW: 324.0000 mg | CHEWABLE_TABLET | ORAL | Status: AC

## 2021-11-11 MED ORDER — ASPIRIN EC 81 MG PO TBEC
81.0000 mg | DELAYED_RELEASE_TABLET | Freq: Every day | ORAL | Status: DC
  Administered 2021-11-13: 81 mg via ORAL
  Filled 2021-11-11 (×2): qty 1

## 2021-11-11 MED ORDER — SERTRALINE HCL 100 MG PO TABS
100.0000 mg | ORAL_TABLET | Freq: Every day | ORAL | Status: DC
  Administered 2021-11-11 – 2021-11-13 (×3): 100 mg via ORAL
  Filled 2021-11-11 (×3): qty 1

## 2021-11-11 MED ORDER — CARVEDILOL 6.25 MG PO TABS
6.2500 mg | ORAL_TABLET | Freq: Two times a day (BID) | ORAL | Status: DC
  Administered 2021-11-11 – 2021-11-12 (×2): 6.25 mg via ORAL
  Filled 2021-11-11: qty 2
  Filled 2021-11-11 (×2): qty 1

## 2021-11-11 MED ORDER — MIRTAZAPINE 7.5 MG PO TABS
3.7500 mg | ORAL_TABLET | Freq: Every day | ORAL | Status: DC
  Filled 2021-11-11 (×2): qty 1

## 2021-11-11 MED ORDER — NITROGLYCERIN 0.4 MG SL SUBL: 0.4000 mg | SUBLINGUAL_TABLET | SUBLINGUAL | Status: DC | PRN

## 2021-11-11 MED ORDER — SEVELAMER CARBONATE 800 MG PO TABS: 1600.0000 mg | ORAL_TABLET | Freq: Three times a day (TID) | ORAL | Status: DC

## 2021-11-11 MED ORDER — VITAMIN B-12 1000 MCG PO TABS
1000.0000 ug | ORAL_TABLET | Freq: Every day | ORAL | Status: DC
  Administered 2021-11-11 – 2021-11-13 (×3): 1000 ug via ORAL
  Filled 2021-11-11 (×3): qty 1

## 2021-11-11 MED ORDER — SODIUM CHLORIDE 0.9% FLUSH
3.0000 mL | Freq: Two times a day (BID) | INTRAVENOUS | Status: DC
Start: 2021-11-11 — End: 2021-11-13
  Administered 2021-11-12 – 2021-11-13 (×2): 3 mL via INTRAVENOUS

## 2021-11-11 MED ORDER — MORPHINE SULFATE (PF) 4 MG/ML IV SOLN
4.0000 mg | Freq: Once | INTRAVENOUS | Status: AC
  Administered 2021-11-11: 4 mg via INTRAVENOUS
  Filled 2021-11-11: qty 1

## 2021-11-11 MED ORDER — HEPARIN BOLUS VIA INFUSION
4000.0000 [IU] | Freq: Once | INTRAVENOUS | Status: AC
  Administered 2021-11-11: 4000 [IU] via INTRAVENOUS
  Filled 2021-11-11: qty 4000

## 2021-11-11 MED ORDER — NITROGLYCERIN 2 % TD OINT
1.0000 [in_us] | TOPICAL_OINTMENT | Freq: Four times a day (QID) | TRANSDERMAL | Status: DC
  Administered 2021-11-11 – 2021-11-13 (×4): 1 [in_us] via TOPICAL
  Filled 2021-11-11: qty 1
  Filled 2021-11-11: qty 30
  Filled 2021-11-11: qty 1
  Filled 2021-11-11: qty 30

## 2021-11-11 MED ORDER — ERYTHROMYCIN 5 MG/GM OP OINT
1.0000 "application " | TOPICAL_OINTMENT | Freq: Every day | OPHTHALMIC | Status: DC
  Administered 2021-11-12: 1 via OPHTHALMIC
  Filled 2021-11-11: qty 3.5

## 2021-11-11 MED ORDER — ASPIRIN 300 MG RE SUPP: 300.0000 mg | RECTAL | Status: AC

## 2021-11-11 MED ORDER — ACETAMINOPHEN 325 MG PO TABS
650.0000 mg | ORAL_TABLET | ORAL | Status: DC | PRN
  Administered 2021-11-12: 650 mg via ORAL
  Filled 2021-11-11: qty 2

## 2021-11-11 MED ORDER — ONDANSETRON HCL 4 MG/2ML IJ SOLN: 4.0000 mg | Freq: Four times a day (QID) | INTRAMUSCULAR | Status: DC | PRN

## 2021-11-11 MED ORDER — MIDODRINE HCL 5 MG PO TABS
ORAL_TABLET | ORAL | Status: AC
  Filled 2021-11-11: qty 2

## 2021-11-11 MED ORDER — ASPIRIN EC 81 MG PO TBEC: 81.0000 mg | DELAYED_RELEASE_TABLET | Freq: Every day | ORAL | Status: DC

## 2021-11-11 MED ORDER — ATORVASTATIN CALCIUM 40 MG PO TABS
40.0000 mg | ORAL_TABLET | Freq: Every day | ORAL | Status: DC
  Administered 2021-11-11 – 2021-11-12 (×2): 40 mg via ORAL
  Filled 2021-11-11 (×2): qty 1

## 2021-11-11 MED ORDER — SACCHAROMYCES BOULARDII 250 MG PO CAPS: 250.0000 mg | ORAL_CAPSULE | Freq: Two times a day (BID) | ORAL | Status: DC

## 2021-11-11 MED ORDER — ASPIRIN 81 MG PO CHEW
324.0000 mg | CHEWABLE_TABLET | Freq: Once | ORAL | Status: AC
Start: 2021-11-11 — End: 2021-11-11
  Administered 2021-11-11: 324 mg via ORAL
  Filled 2021-11-11: qty 4

## 2021-11-11 MED ORDER — HEPARIN SODIUM (PORCINE) 1000 UNIT/ML DIALYSIS
2000.0000 [IU] | INTRAMUSCULAR | Status: DC | PRN
  Filled 2021-11-11: qty 2

## 2021-11-11 MED ORDER — HEPARIN (PORCINE) 25000 UT/250ML-% IV SOLN
1150.0000 [IU]/h | INTRAVENOUS | Status: DC
  Administered 2021-11-11 – 2021-11-12 (×2): 1100 [IU]/h via INTRAVENOUS
  Filled 2021-11-11 (×2): qty 250

## 2021-11-11 MED ORDER — GABAPENTIN 300 MG PO CAPS
300.0000 mg | ORAL_CAPSULE | Freq: Two times a day (BID) | ORAL | Status: DC
  Administered 2021-11-11 – 2021-11-13 (×4): 300 mg via ORAL
  Filled 2021-11-11 (×5): qty 1

## 2021-11-11 MED ORDER — PERFLUTREN LIPID MICROSPHERE
1.0000 mL | INTRAVENOUS | Status: AC | PRN
  Administered 2021-11-11: 4 mL via INTRAVENOUS
  Filled 2021-11-11: qty 10

## 2021-11-11 NOTE — Telephone Encounter (Signed)
Son is calling to say patient is in the hospital due to a heart attach. Please advise  ?

## 2021-11-11 NOTE — Consult Note (Signed)
Renal Service ?Consult Note ?[facility] ? ?Bence Ardeth Sportsman ?11/11/2021 ?[name], MD ?Requesting Physician: Dr. Stanford Breed ? ?Reason for Consult: ESRD pt w/  ?HPI: The patient is a 88 y.o. year-old hx of CAD sp PCI's, ESRD on HD, DM2, HTN, HL, COPD, OSA who presented for chest pain to ED today. EMS found pt hypoxic and went unresponsive at home after vomiting for 10-15 seconds. Hx PPM. Last echo showed LVEF 60-65% in April 2019. Cardiology has seen patient. K+ high in 7's and treated (kayexalate 15gm) and improved to 6's w/ 2nd lab around 11 am. Asked to see for ESRD.   ? ?Pt seen in ED.  No c/o at this time.  Doesn't miss HD sessions, used to cramp a lot but he has cut back on his fluid intake and does not have the same cramping issues he used to.  ? ?ROS - denies CP, no joint pain, no HA, no blurry vision, no rash, no diarrhea, no nausea/ vomiting ? ? ?Past Medical History  ?Past Medical History:  ?Diagnosis Date  ? AICD (automatic cardioverter/defibrillator) present   ? AKI (acute kidney injury) ([place]) 03/27/2017  ? Anemia   ? Anginal pain ([place])   ? Anxiety   ? Arm pain 05/08/2015  ? LEFT ARM  ? Arthritis   ? Atrioventricular block, complete (HCC)   ? a. 2010 s/p pacemaker.  ? Bell's palsy   ? left side. after shingles episode  ? Bilateral renal cysts 07/23/2017  ? Simple and hemorrhagic noted on CT ab/pelvis   ? BPH associated with nocturia   ? Cardiomegaly   ? Chronic systolic CHF (congestive heart failure) ([place])   ? EF normalized by Echo 2019  ? CKD (chronic kidney disease), stage IV ([place])   ? COPD (chronic obstructive pulmonary disease) ([place])   ? Severe  ? Coronary artery disease   ? a. s/p MI in 1994/1995 while in [place] s/p questionable PCI. 03/2015: progression of disease, for staged PCI.  ? Depression   ? Diabetic peripheral neuropathy ([place])   ? Diarrhea   ? Diverticulitis   ? DOE (dyspnea on exertion) 03/28/2015  ? Full dentures   ? Gallstones   ? GERD (gastroesophageal reflux  disease)   ? Gout   ? Hard of hearing   ? B/L  ? Heart murmur   ? History of chronic pancreatitis 07/23/2017  ? noted on CT abd/pelvis  ? History of shingles   ? Hypercholesterolemia   ? Hypertension   ? Ischemic cardiomyopathy   ? MI (myocardial infarction) ([place]) 1994; 1995  ? Neuropathy   ? IN LOWER EXTREMITIES  ? Nosebleed 10/06/2017  ? for 2 months most recent 10/06/2017  ? Obesity   ? Pacemaker   ? medtronic>>> MDT ICD 09/23/15  ? Ruptured appendicitis   ? Sleep apnea   ? "sleeps w/humidifyer when he panics and gets short of breath" (04/08/2015)  ? TIA (transient ischemic attack) X 3  ? Trigger middle finger of left hand   ? Type II diabetes mellitus ([place])   ? Wears glasses   ? Wears hearing aid   ? ?Past Surgical History  ?Past Surgical History:  ?Procedure Laterality Date  ? APPENDECTOMY    ? AV FISTULA PLACEMENT Right 02/07/2019  ? Procedure: ARTERIOVENOUS (AV) FISTULA CREATION RIGHT UPPER ARM;  Surgeon: Waynetta Sandy, MD;  Location: [facility];  Service: Vascular;  Laterality: Right;  ? BIV ICD GENERATOR CHANGEOUT N/A 10/09/2021  ? Procedure:  BIV ICD GENERATOR CHANGEOUT;  Surgeon: Deboraha Sprang, MD;  Location: [facility];  Service: Cardiovascular;  Laterality: N/A;  ? CARDIAC CATHETERIZATION N/A 03/29/2015  ? Procedure: Right/Left Heart Cath and Coronary Angiography;  Surgeon: Quantavis M [name], MD;  Location: [facility];  Service: Cardiovascular;  Laterality: N/A;  ? [address]  ? "after my MI; put me on heart RX after cath"  ? CARDIAC CATHETERIZATION N/A 04/09/2015  ? Procedure: Coronary Stent Intervention;  Surgeon: Joffre M [name], MD;  Location: [facility];  Service: Cardiovascular;  Laterality: N/A;  ? CATARACT EXTRACTION W/ INTRAOCULAR LENS  IMPLANT, BILATERAL    ? CHOLECYSTECTOMY N/A 10/13/2017  ? Procedure: LAPAROSCOPIC CHOLECYSTECTOMY WITH LYSIS OF ADHESIONS;  Surgeon: Ileana Roup, MD;  Location: [facility];  Service: General;  Laterality: N/A;  ?  COLONOSCOPY    ? DENTAL SURGERY    ? EP IMPLANTABLE DEVICE N/A 09/23/2015  ? MDT CRT-D, Dr. Caryl Comes  ? HIATAL HERNIA REPAIR  1977  ? ILEOCECETOMY N/A 03/27/2017  ? Procedure: ILEOCECECTOMY;  Surgeon: Ileana Roup, MD;  Location: [facility];  Service: General;  Laterality: N/A;  ? INSERT / REPLACE / REMOVE PACEMAKER  07/2008  ? Complete heart block status post DDD with good function  ? IR FLUORO GUIDE CV LINE RIGHT  04/03/2019  ? IR US GUIDE VASC ACCESS RIGHT  04/03/2019  ? LAPAROTOMY N/A 03/27/2017  ? Procedure: EXPLORATORY LAPAROTOMY;  Surgeon: Ileana Roup, MD;  Location: [facility];  Service: General;  Laterality: N/A;  ? TONSILLECTOMY    ? UPPER GI ENDOSCOPY    ? ?Family History  ?Family History  ?Problem Relation Age of Onset  ? Stroke Mother   ? Leukemia Father   ? Stroke Sister   ? Heart attack Brother   ? ?Social History  reports that he quit smoking about 14 years ago. His smoking use included cigarettes. He has a 81.00 pack-year smoking history. He has never been exposed to tobacco smoke. He has never used smokeless tobacco. He reports current alcohol use. He reports that he does not use drugs. ?Allergies  ?Allergies  ?Allergen Reactions  ? Bee Venom Anaphylaxis  ? Lyrica [Pregabalin] Other (See Comments)  ?  hallucinations  ? Prednisone Other (See Comments)  ?  hallucinations  ? Zocor [Simvastatin] Nausea Only and Other (See Comments)  ?  Headache with brand name only.  Can take the generic.  ? ?Home medications ?Prior to Admission medications   ?Medication Sig Start Date End Date Taking? Authorizing Provider  ?aspirin EC 81 MG tablet Take 81 mg by mouth daily.   Yes [provider]  ?atorvastatin (LIPITOR) 40 MG tablet Take 1 tablet (40 mg total) by mouth daily. 11/03/21  Yes [name], MD  ?carbamide peroxide (DEBROX) 6.5 % OTIC solution Place 5 drops into both ears 2 (two) times daily. ?Patient taking differently: Place 5 drops into both ears 2 (two) times daily as needed (earwax). 01/16/21   Yes Allwardt, Randa Evens, PA-C  ?Carboxymethylcellul-Glycerin (LUBRICATING EYE DROPS OP) Place 1 drop into both eyes 3 (three) times daily as needed (dry eyes).   Yes [provider]  ?carvedilol (COREG) 6.25 MG tablet Take 1 tablet (6.25 mg total) by mouth 2 (two) times daily with a meal. Hold on mornings you have dialysis and the night before 11/15/20  Yes [name], MD  ?erythromycin ophthalmic ointment Place 1 application into the left eye at  bedtime.   Yes [provider]  ?gabapentin (NEURONTIN) 300 MG capsule TAKE 1 CAPSULE BY MOUTH AT BEDTIME FOR 7 DAYS AND THEN 2 CAPSULES DAILY (MORNING  AND  BEDTIME)  THEREAFTER ?Patient taking differently: Take 300 mg by mouth 2 (two) times daily. 11/05/21  Yes Edrick Kins, DPM  ?lansoprazole (PREVACID) 15 MG capsule Take 1 tablet (15mg ) daily 11/10/21  Yes [name], MD  ?lidocaine (XYLOCAINE) 5 % ointment Apply 1 application topically as needed. 02/06/21  Yes Patel, Donika K, DO  ?midodrine (PROAMATINE) 10 MG tablet Take 10 mg by mouth 3 (three) times a week. 08/19/21  Yes [provider]  ?mirtazapine (REMERON) 7.5 MG tablet Take 0.5-1 tablets (3.75-7.5 mg total) by mouth at bedtime. 05/30/19  Yes [name], MD  ?multivitamin (RENA-VIT) TABS tablet Take 1 tablet by mouth daily. 08/22/21  Yes [name], MD  ?nitroGLYCERIN (NITROSTAT) 0.4 MG SL tablet Place 1 tablet (0.4 mg total) under the tongue every 5 (five) minutes as needed for chest pain. 2 doses before calling for emergent help 06/21/18  Yes [name], MD  ?oxymetazoline (AFRIN) 0.05 % nasal spray Place 1 spray into both nostrils 2 (two) times daily as needed for congestion.   Yes [provider]  ?saccharomyces boulardii (FLORASTOR) 250 MG capsule Take 1 capsule (250 mg total) by mouth 2 (two) times daily. 04/08/19  Yes Regalado, Belkys A, MD  ?sertraline (ZOLOFT) 100 MG tablet TAKE 1 & 1/2 (ONE & ONE-HALF) TABLETS BY MOUTH ONCE DAILY 09/02/21   Yes [name], MD  ?silver sulfADIAZINE (SILVADENE) 1 % cream Apply pea-sized amount to wound daily. 10/01/20  Yes Evelina Bucy, DPM  ?triamcinolone cream (KENALOG) 0.1 % Apply 1 application topical

## 2021-11-11 NOTE — ED Notes (Signed)
Pt transferred to dialysis.

## 2021-11-11 NOTE — H&P (Signed)
? ?  Please see Consult note for H&P it was mislabeled ? ?Cecilie Kicks, FNP-C ?At [place]  ?YYF:[id number] or after 5pm and on weekends call [phone] ?11/11/2021.now   .  ?

## 2021-11-11 NOTE — Telephone Encounter (Signed)
Please schedule pt f/u for refills. ?

## 2021-11-11 NOTE — ED Provider Notes (Signed)
?[facility] ?Provider Note ? ? ?CSN: [id number] ?Arrival date & time: 11/11/21  0844 ? ?  ? ?History ? ?Chief Complaint  ?Patient presents with  ? Chest Pain  ? ? ?Rodney Farley is a 88 y.o. male with history of DM type II with CKD on hemodialysis MWF, chronic systolic CHF, COPD, CAD with previous MI, previous history of A-fib and pacemaker presents to the ED via EMS for evaluation of chest pain described as sharp radiating into the right shoulder.  Pain started when patient was in bed at around 3 AM this morning and was constant before he decided to call EMS.  He also complained of shortness of breath.  When EMS arrived, he was found to be 83% on room air and is placed on nonrebreather with improvement in saturations.  Patient also complains of nausea and reported an episode of emesis by EMS.  He was given IM Zofran.  Patient was reported diaphoretic by EMS.  No treatment prior to arrival.  Patient continues to feel short of breath, nauseous with chest pain.  He is anxious. ? ?Of note, patient follows with Dr. [name] and Dr. Caryl Comes of cardiology and was scheduled for an appointment later today for reevaluation of his pacemaker.  Patient last had echocardiogram on 10/15/2017 with LVEF 60 to 65%. ? ? ?Chest Pain ? ?  ? ?Home Medications ?Prior to Admission medications   ?Medication Sig Start Date End Date Taking? Authorizing Provider  ?aspirin EC 81 MG tablet Take 81 mg by mouth daily.    [provider]  ?atorvastatin (LIPITOR) 40 MG tablet Take 1 tablet (40 mg total) by mouth daily. 11/03/21   [name], MD  ?atorvastatin (LIPITOR) 40 MG tablet Take 1 tablet by mouth once daily 11/03/21   [name], MD  ?carbamide peroxide (DEBROX) 6.5 % OTIC solution Place 5 drops into both ears 2 (two) times daily. 01/16/21   Allwardt, Randa Evens, PA-C  ?Carboxymethylcellul-Glycerin (LUBRICATING EYE DROPS OP) Place 1 drop into both eyes 3 (three) times daily as needed  (dry eyes).    [provider]  ?carvedilol (COREG) 6.25 MG tablet Take 1 tablet (6.25 mg total) by mouth 2 (two) times daily with a meal. Hold on mornings you have dialysis and the night before 11/15/20   [name], MD  ?ciclopirox [facility]) 8 % solution as directed. 04/10/19   [provider]  ?erythromycin ophthalmic ointment Place 1 application into the left eye at bedtime.    [provider]  ?gabapentin (NEURONTIN) 300 MG capsule TAKE 1 CAPSULE BY MOUTH AT BEDTIME FOR 7 DAYS AND THEN 2 CAPSULES DAILY (MORNING  AND  BEDTIME)  THEREAFTER 11/05/21   Edrick Kins, DPM  ?glucose blood (FREESTYLE TEST STRIPS) test strip Use to check blood sugar daily 05/01/19   [name], MD  ?glucose monitoring kit (FREESTYLE) monitoring kit USE TO MONITOR BLOOD GLUCOSE AS DIRECTED 05/01/19   [name], MD  ?lansoprazole (PREVACID) 15 MG capsule Take 1 tablet (87m) daily 11/10/21   HMarin Olp MD  ?lidocaine (XYLOCAINE) 5 % ointment Apply 1 application topically as needed. 02/06/21   PAlda Berthold DO  ?Methoxy PEG-Epoetin Beta (MIRCERA IJ) as directed. 02/26/21 07/15/22  [provider]  ?mirtazapine (REMERON) 7.5 MG tablet Take 0.5-1 tablets (3.75-7.5 mg total) by mouth at bedtime. 05/30/19   HMarin Olp MD  ?multivitamin (RENA-VIT) TABS tablet Take 1 tablet by mouth daily.  08/22/21   [name], MD  ?nitroGLYCERIN (NITROSTAT) 0.4 MG SL tablet Place 1 tablet (0.4 mg total) under the tongue every 5 (five) minutes as needed for chest pain. 2 doses before calling for emergent help 06/21/18   [name], MD  ?oxymetazoline (AFRIN) 0.05 % nasal spray Place 1 spray into both nostrils 2 (two) times daily as needed for congestion.    [provider]  ?saccharomyces boulardii (FLORASTOR) 250 MG capsule Take 1 capsule (250 mg total) by mouth 2 (two) times daily. 04/08/19   Regalado, Belkys A, MD  ?sertraline (ZOLOFT) 100 MG tablet TAKE 1 & 1/2 (ONE &  ONE-HALF) TABLETS BY MOUTH ONCE DAILY 09/02/21   [name], MD  ?sevelamer carbonate (RENVELA) 800 MG tablet Take 1,600 mg by mouth 3 (three) times daily. 05/11/19   [provider]  ?sevelamer carbonate (RENVELA) 800 MG tablet Take 2 tablets (1,600 mg total) by mouth in the morning and at bedtime. ?Patient not taking: Reported on 08/26/2021 05/20/21   [name], MD  ?silver sulfADIAZINE (SILVADENE) 1 % cream Apply pea-sized amount to wound daily. 10/01/20   Evelina Bucy, DPM  ?SPS 15 GM/60ML suspension as directed. 04/10/19   [provider]  ?triamcinolone cream (KENALOG) 0.1 % Apply 1 application topically as directed. 04/10/19   [provider]  ?vitamin B-12 (CYANOCOBALAMIN) 1000 MCG tablet Take 1,000 mcg by mouth daily.    [provider]  ?   ? ?Allergies    ?Bee venom, Lyrica [pregabalin], Prednisone, and Zocor [simvastatin]   ? ?Review of Systems   ?Review of Systems  ?Cardiovascular:  Positive for chest pain.  ? ?Physical Exam ?Updated Vital Signs ?BP (!) 146/58   Pulse 70   Temp 97.6 ?F (36.4 ?C) (Oral)   Resp (!) 24   SpO2 100%  ?Vitals:  ? 11/11/21 1415 11/11/21 1430  ?BP: (!) 131/53 (!) 126/52  ?Pulse: 71 72  ?Resp: 16 20  ?Temp:    ?SpO2: 98% 97%  ? ? ?Physical Exam ?Vitals and nursing note reviewed.  ?Constitutional:   ?   General: He is in acute distress.  ?   Appearance: He is diaphoretic.  ?HENT:  ?   Head: Atraumatic.  ?Eyes:  ?   Conjunctiva/sclera: Conjunctivae normal.  ?Cardiovascular:  ?   Rate and Rhythm: Normal rate and regular rhythm.  ?   Pulses: Normal pulses.     ?     Radial pulses are 2+ on the right side and 2+ on the left side.  ?     Dorsalis pedis pulses are 2+ on the right side and 2+ on the left side.  ?   Heart sounds: No murmur heard. ?Pulmonary:  ?   Effort: Pulmonary effort is normal. Tachypnea present.  ?   Breath sounds: Normal breath sounds.  ?Chest:  ?   Chest wall: No tenderness.  ?Abdominal:  ?   General: Abdomen is  protuberant. There is no distension.  ?   Palpations: Abdomen is soft.  ?   Tenderness: There is no abdominal tenderness.  ?Musculoskeletal:     ?   General: Normal range of motion.  ?   Cervical back: Normal range of motion.  ?   Right lower leg: No edema.  ?   Left lower leg: No edema.  ?Skin: ?   General: Skin is warm.  ?   Capillary Refill: Capillary refill takes less than 2 seconds.  ?Neurological:  ?  General: No focal deficit present.  ?   Mental Status: He is alert.  ?Psychiatric:     ?   Mood and Affect: Mood normal.  ? ? ?ED Results / Procedures / Treatments   ?Labs ?(all labs ordered are listed, but only abnormal results are displayed) ?Labs Reviewed  ?BASIC METABOLIC PANEL - Abnormal; Notable for the following components:  ?    Result Value  ? Potassium >7.5 (*)   ? Chloride 94 (*)   ? Glucose, Bld 247 (*)   ? BUN 26 (*)   ? Creatinine, Ser 7.25 (*)   ? GFR, Estimated 7 (*)   ? Anion gap 18 (*)   ? All other components within normal limits  ?CBC WITH DIFFERENTIAL/PLATELET - Abnormal; Notable for the following components:  ? RBC 3.86 (*)   ? MCV 103.9 (*)   ? MCH 34.5 (*)   ? Platelets 96 (*)   ? All other components within normal limits  ?HEPATIC FUNCTION PANEL - Abnormal; Notable for the following components:  ? Bilirubin, Direct 0.5 (*)   ? All other components within normal limits  ?TROPONIN I (HIGH SENSITIVITY) - Abnormal; Notable for the following components:  ? Troponin I (High Sensitivity) 1,581 (*)   ? All other components within normal limits  ?LIPASE, BLOOD  ?BASIC METABOLIC PANEL  ?CBG MONITORING, ED  ?TROPONIN I (HIGH SENSITIVITY)  ? ? ?EKG ?EKG Interpretation ? ?Date/Time:  Tuesday Nov 11 2021 08:50:41 EDT ?Ventricular Rate:  71 ?PR Interval:  184 ?QRS Duration: 165 ?QT Interval:  468 ?QTC Calculation: 509 ?R Axis:   256 ?Text Interpretation: Electronic atrial pacemaker Ventricular premature complex Right bundle branch block No significant change since last tracing Confirmed by Blanchie Dessert [phone]) on 11/11/2021 9:25:40 AM ? ?Radiology ?DG Chest Portable 1 View ? ?Result Date: 11/11/2021 ?CLINICAL DATA:  Chest pain and shortness of breath. EXAM: PORTABLE CHEST 1 VIEW COMPARISON:  Chest x-ra

## 2021-11-11 NOTE — Telephone Encounter (Signed)
LVM to schedule a follow up appt.

## 2021-11-11 NOTE — Telephone Encounter (Signed)
Spoke with pt's son, DPR and advised Dr Caryl Comes has been notified of the event.  Advised if family needs anything further to please reach out.  Pt's son verbalizes understanding and thanked Therapist, sports for the phone call. ?

## 2021-11-11 NOTE — ED Triage Notes (Signed)
Pt BIB [facility] from home called out for CP/SOB, 83% RA, placed on NRB by fire. HD pt, MWF treatments. Lungs clear, diminished right lower. On EMS arrival, BP was 138/78 HR, CBG 229. Pt had episode of emesis and then went unresponsive 10-15 seconds witnessed by EMS,  given IM zofran. Pt diaphoretic during this episode, EMS unable to see EKG monitor due to diaphoresis. Pt has a pacemaker, cards appt today for pacemaker.  ?

## 2021-11-11 NOTE — Consult Note (Signed)
?Cardiology Consultation:  ? ?Patient ID: Rodney Farley ?MRN: [id number]; DOB: 10-01-1931 ? ?Admit date: 11/11/2021 ?Date of Consult: 11/11/2021 ? ?PCP:  [name], MD ?  ?[facility] Providers ?Cardiologist:  Garrie [name], MD      ? ? ?Patient Profile:  ? ?Rodney Farley is a 88 y.o. male with a hx of CAD with MI in 1994/1995 while in [place] ? PCI and PCI in 2016 with CHB om 2010, ESRD on HD, DM, HTN, HLD, remote tobacco use, COPD, moderate sleep apnea chronic systolic HF who is being seen 11/11/2021 for the evaluation of chest pain at the request of Dr. Maryan Rued. ? ?History of Present Illness:  ? ?Mr. Rodney Farley with above hx and from cardiac hx initial MI in 1994 or 5 with possible PCI.  Last cath 2016 with significant CAD  discussed CABG but proceeded with PCI stent to mLAD, second diagonal and OM3 with DES.   Last nuc with ishemia after elevated troponin with surgery but no angina so imdur increased.   ? ?Echo 2016 with EF 25-30% and rec'd upgrade of device.  Echo in 2019 with EF 60-65%, no RWMA.  Mild MR.   ? ?Pt with CHB and PPM upgraded to ICD-CRT in 2017 and now device was ERI so underwent HV  CRT replacement 10/09/21 no lead changes just generator Medtronic MRI compatible  ? ?Patient presented with chest pain.  It is substernal and described as a sharp pain with pressure radiating to his left upper extremity.  It was not pleuritic or positional.  On Saturday he had an episode that resolved spontaneously and he cannot tell the duration.  At 330 this morning he had recurrent chest pain associated with nausea, diaphoresis and dyspnea.  His pain resolved in the emergency room with morphine.  He is presently pain-free.  He has otherwise been feeling well.  Cardiology now asked to evaluate. ? ? ?EKG:  The EKG was personally reviewed and demonstrates:  SR with atrial and V pacing.  ? ?Hs troponin 1581  ?Glucose 247, Cr 7.25 K_ >7.5 repeat 6.0 ?Hgb 13.3 WBC 7.9 plts 95  (plts were 154 in  10/02/21) ? ?PCXR  FINDINGS: ?Interval removal of the right internal jugular dialysis catheter. ?Unchanged left chest wall pacemaker. Stable cardiomegaly. Mild ?peripheral and basilar predominant coarse interstitial markings, new ?since the prior study. No focal consolidation, pleural effusion, or ?pneumothorax. No acute osseous abnormality. ?  ?IMPRESSION: ?1. New mild peripheral and basilar predominant coarse interstitial ?markings have a more chronic appearance and may reflect mild ?interstitial lung disease. Interstitial pulmonary edema is ?considered less likely ?  ? ?Past Medical History:  ?Diagnosis Date  ? AICD (automatic cardioverter/defibrillator) present   ? AKI (acute kidney injury) ([place]) 03/27/2017  ? Anemia   ? Anginal pain ([place])   ? Anxiety   ? Arm pain 05/08/2015  ? LEFT ARM  ? Arthritis   ? Atrioventricular block, complete (HCC)   ? a. 2010 s/p pacemaker.  ? Bell's palsy   ? left side. after shingles episode  ? Bilateral renal cysts 07/23/2017  ? Simple and hemorrhagic noted on CT ab/pelvis   ? BPH associated with nocturia   ? Cardiomegaly   ? Chronic systolic CHF (congestive heart failure) ([place])   ? EF normalized by Echo 2019  ? CKD (chronic kidney disease), stage IV ([place])   ? COPD (chronic obstructive pulmonary disease) ([place])   ? Severe  ? Coronary artery disease   ? a. s/p  MI in 1994/1995 while in [place] s/p questionable PCI. 03/2015: progression of disease, for staged PCI.  ? Depression   ? Diabetic peripheral neuropathy ([place])   ? Diarrhea   ? Diverticulitis   ? DOE (dyspnea on exertion) 03/28/2015  ? Full dentures   ? Gallstones   ? GERD (gastroesophageal reflux disease)   ? Gout   ? Hard of hearing   ? B/L  ? Heart murmur   ? History of chronic pancreatitis 07/23/2017  ? noted on CT abd/pelvis  ? History of shingles   ? Hypercholesterolemia   ? Hypertension   ? Ischemic cardiomyopathy   ? MI (myocardial infarction) ([place]) 1994; 1995  ? Neuropathy   ? IN LOWER EXTREMITIES  ? Nosebleed 10/06/2017   ? for 2 months most recent 10/06/2017  ? Obesity   ? Pacemaker   ? medtronic>>> MDT ICD 09/23/15  ? Ruptured appendicitis   ? Sleep apnea   ? "sleeps w/humidifyer when he panics and gets short of breath" (04/08/2015)  ? TIA (transient ischemic attack) X 3  ? Trigger middle finger of left hand   ? Type II diabetes mellitus ([place])   ? Wears glasses   ? Wears hearing aid   ? ? ?Past Surgical History:  ?Procedure Laterality Date  ? APPENDECTOMY    ? AV FISTULA PLACEMENT Right 02/07/2019  ? Procedure: ARTERIOVENOUS (AV) FISTULA CREATION RIGHT UPPER ARM;  Surgeon: Waynetta Sandy, MD;  Location: [facility];  Service: Vascular;  Laterality: Right;  ? BIV ICD GENERATOR CHANGEOUT N/A 10/09/2021  ? Procedure: BIV ICD GENERATOR CHANGEOUT;  Surgeon: Deboraha Sprang, MD;  Location: [facility];  Service: Cardiovascular;  Laterality: N/A;  ? CARDIAC CATHETERIZATION N/A 03/29/2015  ? Procedure: Right/Left Heart Cath and Coronary Angiography;  Surgeon: Cordero M [name], MD;  Location: [facility];  Service: Cardiovascular;  Laterality: N/A;  ? [place]  ? "after my MI; put me on heart RX after cath"  ? CARDIAC CATHETERIZATION N/A 04/09/2015  ? Procedure: Coronary Stent Intervention;  Surgeon: Jeanmarc M [name], MD;  Location: [facility];  Service: Cardiovascular;  Laterality: N/A;  ? CATARACT EXTRACTION W/ INTRAOCULAR LENS  IMPLANT, BILATERAL    ? CHOLECYSTECTOMY N/A 10/13/2017  ? Procedure: LAPAROSCOPIC CHOLECYSTECTOMY WITH LYSIS OF ADHESIONS;  Surgeon: Ileana Roup, MD;  Location: [facility];  Service: General;  Laterality: N/A;  ? COLONOSCOPY    ? DENTAL SURGERY    ? EP IMPLANTABLE DEVICE N/A 09/23/2015  ? MDT CRT-D, Dr. Caryl Comes  ? HIATAL HERNIA REPAIR  1977  ? ILEOCECETOMY N/A 03/27/2017  ? Procedure: ILEOCECECTOMY;  Surgeon: Ileana Roup, MD;  Location: [facility];  Service: General;  Laterality: N/A;  ? INSERT / REPLACE / REMOVE PACEMAKER  07/2008  ? Complete heart block status post DDD  with good function  ? IR FLUORO GUIDE CV LINE RIGHT  04/03/2019  ? IR US GUIDE VASC ACCESS RIGHT  04/03/2019  ? LAPAROTOMY N/A 03/27/2017  ? Procedure: EXPLORATORY LAPAROTOMY;  Surgeon: Ileana Roup, MD;  Location: [facility];  Service: General;  Laterality: N/A;  ? TONSILLECTOMY    ? UPPER GI ENDOSCOPY    ?  ? ?Home Medications:  ?Prior to Admission medications   ?Medication Sig Start Date End Date Taking? Authorizing Provider  ?aspirin EC 81 MG tablet Take 81 mg by mouth daily.    [provider]  ?atorvastatin (LIPITOR) 40 MG tablet Take 1 tablet (  40 mg total) by mouth daily. 11/03/21   [name], MD  ?atorvastatin (LIPITOR) 40 MG tablet Take 1 tablet by mouth once daily 11/03/21   [name], MD  ?carbamide peroxide (DEBROX) 6.5 % OTIC solution Place 5 drops into both ears 2 (two) times daily. 01/16/21   Allwardt, Randa Evens, PA-C  ?Carboxymethylcellul-Glycerin (LUBRICATING EYE DROPS OP) Place 1 drop into both eyes 3 (three) times daily as needed (dry eyes).    [provider]  ?carvedilol (COREG) 6.25 MG tablet Take 1 tablet (6.25 mg total) by mouth 2 (two) times daily with a meal. Hold on mornings you have dialysis and the night before 11/15/20   [name], MD  ?ciclopirox [facility]) 8 % solution as directed. 04/10/19   [provider]  ?erythromycin ophthalmic ointment Place 1 application into the left eye at bedtime.    [provider]  ?gabapentin (NEURONTIN) 300 MG capsule TAKE 1 CAPSULE BY MOUTH AT BEDTIME FOR 7 DAYS AND THEN 2 CAPSULES DAILY (MORNING  AND  BEDTIME)  THEREAFTER 11/05/21   Edrick Kins, DPM  ?glucose blood (FREESTYLE TEST STRIPS) test strip Use to check blood sugar daily 05/01/19   [name], MD  ?glucose monitoring kit (FREESTYLE) monitoring kit USE TO MONITOR BLOOD GLUCOSE AS DIRECTED 05/01/19   [name], MD  ?lansoprazole (PREVACID) 15 MG capsule Take 1 tablet (24m) daily 11/10/21   HMarin Olp MD  ?lidocaine  (XYLOCAINE) 5 % ointment Apply 1 application topically as needed. 02/06/21   PAlda Berthold DO  ?Methoxy PEG-Epoetin Beta (MIRCERA IJ) as directed. 02/26/21 07/15/22  [provider]  ?mirtazapine (REMERON) 7.5

## 2021-11-11 NOTE — Progress Notes (Signed)
ANTICOAGULATION CONSULT NOTE - Initial Consult ? ?Pharmacy Consult for heparin ?Indication: chest pain/ACS ? ?Allergies  ?Allergen Reactions  ? Bee Venom Anaphylaxis  ? Lyrica [Pregabalin] Other (See Comments)  ?  hallucinations  ? Prednisone Other (See Comments)  ?  hallucinations  ? Zocor [Simvastatin] Nausea Only and Other (See Comments)  ?  Headache with brand name only.  Can take the generic.  ? ? ?Patient Measurements: ?  ?Heparin Dosing Weight: 86kg ? ?Vital Signs: ?Temp: 97.6 ?F (36.4 ?C) (05/02 2197) ?Temp Source: Oral (05/02 5883) ?BP: 147/61 (05/02 1215) ?Pulse Rate: 73 (05/02 1215) ? ?Labs: ?Recent Labs  ?  11/11/21 ?0900 11/11/21 ?1102  ?HGB 13.3  --   ?HCT 40.1  --   ?PLT 96*  --   ?CREATININE 7.25* 7.33*  ?TROPONINIHS [id number]* [id number]*  ? ? ?CrCl cannot be calculated (Unknown ideal weight.). ? ? ?Medical History: ?Past Medical History:  ?Diagnosis Date  ? AICD (automatic cardioverter/defibrillator) present   ? AKI (acute kidney injury) ([place]) 03/27/2017  ? Anemia   ? Anginal pain ([place])   ? Anxiety   ? Arm pain 05/08/2015  ? LEFT ARM  ? Arthritis   ? Atrioventricular block, complete (HCC)   ? a. 2010 s/p pacemaker.  ? Bell's palsy   ? left side. after shingles episode  ? Bilateral renal cysts 07/23/2017  ? Simple and hemorrhagic noted on CT ab/pelvis   ? BPH associated with nocturia   ? Cardiomegaly   ? Chronic systolic CHF (congestive heart failure) ([place])   ? EF normalized by Echo 2019  ? CKD (chronic kidney disease), stage IV ([place])   ? COPD (chronic obstructive pulmonary disease) ([place])   ? Severe  ? Coronary artery disease   ? a. s/p MI in 1994/1995 while in [place] s/p questionable PCI. 03/2015: progression of disease, for staged PCI.  ? Depression   ? Diabetic peripheral neuropathy ([place])   ? Diarrhea   ? Diverticulitis   ? DOE (dyspnea on exertion) 03/28/2015  ? Full dentures   ? Gallstones   ? GERD (gastroesophageal reflux disease)   ? Gout   ? Hard of hearing   ? B/L  ? Heart murmur   ? History of  chronic pancreatitis 07/23/2017  ? noted on CT abd/pelvis  ? History of shingles   ? Hypercholesterolemia   ? Hypertension   ? Ischemic cardiomyopathy   ? MI (myocardial infarction) ([place]) 1994; 1995  ? Neuropathy   ? IN LOWER EXTREMITIES  ? Nosebleed 10/06/2017  ? for 2 months most recent 10/06/2017  ? Obesity   ? Pacemaker   ? medtronic>>> MDT ICD 09/23/15  ? Ruptured appendicitis   ? Sleep apnea   ? "sleeps w/humidifyer when he panics and gets short of breath" (04/08/2015)  ? TIA (transient ischemic attack) X 3  ? Trigger middle finger of left hand   ? Type II diabetes mellitus ([place])   ? Wears glasses   ? Wears hearing aid   ? ?Assessment: ?Rodney Farley presenting with CP, hx CAD s/p PCI, he is not on anticoagulation PTA ? ?Goal of Therapy:  ?Heparin level 0.3-0.7 units/ml ?Monitor platelets by anticoagulation protocol: Yes ?  ?Plan:  ?Heparin 4000 units IV x 1, and gtt at 1100 units/hr ?F/u 8 hour heparin level ?F/u cath plans ? ?Bertis Ruddy, PharmD ?Clinical Pharmacist ?ED Pharmacist Phone # [phone] ?11/11/2021 12:52 PM ? ? ? ?

## 2021-11-11 NOTE — H&P (Signed)
?Lelon Perla, MD ?Physician ?Cardiology ?Consult Note    ?Signed ?Date of Service:  11/11/2021 11:42 AM ?  ?Signed    ?  ?Expand All Collapse All ?   ?   ?   ?   ?   ?   ?   ?   ?   ?   ?   ?   ?   ?   ?   ?   ?   ?   ?   ?   ?   ?   ?   ?   ?   ?   ?   ?   ?   ?   ?   ?   ?   ?   ?   ?   ?   ?   ?   ?   ?   ?   ?   ?   ?   ?   ?   ?   ?   ?   ?   ?   ?   ?   ?   ?   ?   ?   ?   ?   ?   ?   ?   ?   ?   ?   ?   ?   ?   ?   ?   ?   ?   ?   ?   ?   ?   ?   ?   ?   ?   ?   ?   ?   ?   ?   ?   ?   ?   ?   ?   ?   ?   ?   ?   ?   ?   ?   ?   ?   ?   ?   ?   ?   ?   ?   ?   ?   ?   ?   ?   ?   ?   ?   ?   ?   ?   ?   ?   ?   ?   ?   ?   ?   ?   ?   ?   ?   ?   ?   ?   ?   ?   ?   ?   ?   ?   ?   ?   ?   ?   ?   ?   ?   ?   ?   ?   ?   ?   ?   ?   ?   ?   ?   ?   ?   ?   ?   ?   ?   ?   ?   ?   ?   ?   ?   ?Cardiology Consultation:  ?  ?Patient ID: Rodney Farley ?MRN: [id number]; DOB: December 21, 1931 ?  ?Admit date: 11/11/2021 ?Date of Consult: 11/11/2021 ?  ?PCP:  [name], MD ?             ?[facility] Providers ?Cardiologist:  Choua [name], MD      ?  ?  ?Patient Profile:  ?  ?Rodney Farley is a  88 y.o. male with a hx of CAD with MI in 1994/1995 while in [place] ? PCI and PCI in 2016 with CHB om 2010, ESRD on HD, DM, HTN, HLD, remote tobacco use, COPD, moderate sleep apnea chronic systolic HF who is being seen 11/11/2021 for the evaluation of chest pain at the request of Dr. Maryan Rued. ?  ?History of Present Illness:  ?  ?Mr. Rodney Farley with above hx and from cardiac hx initial MI in 1994 or 5 with possible PCI.  Last cath 2016 with significant CAD  discussed CABG but proceeded with PCI stent to mLAD, second diagonal and OM3 with DES.   Last nuc with ishemia after elevated troponin with surgery but no angina so imdur increased.   ?  ?Echo 2016 with EF 25-30% and rec'd upgrade of device.  Echo in 2019 with EF 60-65%, no RWMA.  Mild MR.   ?  ?Pt with CHB and PPM upgraded to ICD-CRT in 2017 and now device  was ERI so underwent HV  CRT replacement 10/09/21 no lead changes just generator Medtronic MRI compatible  ?  ?Patient presented with chest pain.  It is substernal and described as a sharp pain with pressure radiating to his left upper extremity.  It was not pleuritic or positional.  On Saturday he had an episode that resolved spontaneously and he cannot tell the duration.  At 330 this morning he had recurrent chest pain associated with nausea, diaphoresis and dyspnea.  His pain resolved in the emergency room with morphine.  He is presently pain-free.  He has otherwise been feeling well.  Cardiology now asked to evaluate. ?  ?  ?EKG:  The EKG was personally reviewed and demonstrates:  SR with atrial and V pacing.  ?  ?Hs troponin 1581  ?Glucose 247, Cr 7.25 K_ >7.5 repeat 6.0 ?Hgb 13.3 WBC 7.9 plts 95  (plts were 154 in 10/02/21) ?  ?PCXR  FINDINGS: ?Interval removal of the right internal jugular dialysis catheter. ?Unchanged left chest wall pacemaker. Stable cardiomegaly. Mild ?peripheral and basilar predominant coarse interstitial markings, new ?since the prior study. No focal consolidation, pleural effusion, or ?pneumothorax. No acute osseous abnormality. ?  ?IMPRESSION: ?1. New mild peripheral and basilar predominant coarse interstitial ?markings have a more chronic appearance and may reflect mild ?interstitial lung disease. Interstitial pulmonary edema is ?considered less likely ?  ?  ?    ?Past Medical History:  ?Diagnosis Date  ? AICD (automatic cardioverter/defibrillator) present    ? AKI (acute kidney injury) ([place]) 03/27/2017  ? Anemia    ? Anginal pain ([place])    ? Anxiety    ? Arm pain 05/08/2015  ?  LEFT ARM  ? Arthritis    ? Atrioventricular block, complete (HCC)    ?  a. 2010 s/p pacemaker.  ? Bell's palsy    ?  left side. after shingles episode  ? Bilateral renal cysts 07/23/2017  ?  Simple and hemorrhagic noted on CT ab/pelvis   ? BPH associated with nocturia    ? Cardiomegaly    ? Chronic systolic CHF  (congestive heart failure) ([place])    ?  EF normalized by Echo 2019  ? CKD (chronic kidney disease), stage IV ([place])    ? COPD (chronic obstructive pulmonary disease) ([place])    ?  Severe  ? Coronary artery disease    ?  a. s/p MI in 1994/1995 while in [place] s/p questionable PCI. 03/2015: progression of disease, for staged PCI.  ? Depression    ?  Diabetic peripheral neuropathy ([place])    ? Diarrhea    ? Diverticulitis    ? DOE (dyspnea on exertion) 03/28/2015  ? Full dentures    ? Gallstones    ? GERD (gastroesophageal reflux disease)    ? Gout    ? Hard of hearing    ?  B/L  ? Heart murmur    ? History of chronic pancreatitis 07/23/2017  ?  noted on CT abd/pelvis  ? History of shingles    ? Hypercholesterolemia    ? Hypertension    ? Ischemic cardiomyopathy    ? MI (myocardial infarction) ([place]) 1994; 1995  ? Neuropathy    ?  IN LOWER EXTREMITIES  ? Nosebleed 10/06/2017  ?  for 2 months most recent 10/06/2017  ? Obesity    ? Pacemaker    ?  medtronic>>> MDT ICD 09/23/15  ? Ruptured appendicitis    ? Sleep apnea    ?  "sleeps w/humidifyer when he panics and gets short of breath" (04/08/2015)  ? TIA (transient ischemic attack) X 3  ? Trigger middle finger of left hand    ? Type II diabetes mellitus ([place])    ? Wears glasses    ? Wears hearing aid    ?  ?  ?     ?Past Surgical History:  ?Procedure Laterality Date  ? APPENDECTOMY      ? AV FISTULA PLACEMENT Right 02/07/2019  ?  Procedure: ARTERIOVENOUS (AV) FISTULA CREATION RIGHT UPPER ARM;  Surgeon: Waynetta Sandy, MD;  Location: [facility];  Service: Vascular;  Laterality: Right;  ? BIV ICD GENERATOR CHANGEOUT N/A 10/09/2021  ?  Procedure: BIV ICD GENERATOR CHANGEOUT;  Surgeon: Deboraha Sprang, MD;  Location: [facility];  Service: Cardiovascular;  Laterality: N/A;  ? CARDIAC CATHETERIZATION N/A 03/29/2015  ?  Procedure: Right/Left Heart Cath and Coronary Angiography;  Surgeon: Donn M [name], MD;  Location: [facility];  Service: Cardiovascular;  Laterality:  N/A;  ? [place]  ?  "after my MI; put me on heart RX after cath"  ? CARDIAC CATHETERIZATION N/A 04/09/2015  ?  Procedure: Coronary Stent Intervention;  Surgeon: Esten M [name], MD;  Location: [facility];  Service: Cardiovascular;  Laterality: N/A;  ? CATARACT EXTRACTION W/ INTRAOCULAR LENS  IMPLANT, BILATERAL      ? CHOLECYSTECTOMY N/A 10/13/2017  ?  Procedure: LAPAROSCOPIC CHOLECYSTECTOMY WITH LYSIS OF ADHESIONS;  Surgeon: Ileana Roup, MD;  [facility];  Service: General;  Laterality: N/A;  ? COLONOSCOPY      ? DENTAL SURGERY      ? EP IMPLANTABLE DEVICE N/A 09/23/2015  ?  MDT CRT-D, Dr. Caryl Comes  ? HIATAL HERNIA REPAIR   1977  ? ILEOCECETOMY N/A 03/27/2017  ?  Procedure: ILEOCECECTOMY;  Surgeon: Ileana Roup, MD;  Location: [facility];  Service: General;  Laterality: N/A;  ? INSERT / REPLACE / REMOVE PACEMAKER   07/2008  ?  Complete heart block status post DDD with good function  ? IR FLUORO GUIDE CV LINE RIGHT   04/03/2019  ? IR US GUIDE VASC ACCESS RIGHT   04/03/2019  ? LAPAROTOMY N/A 03/27/2017  ?  Procedure: EXPLORATORY LAPAROTOMY;  Surgeon: Ileana Roup, MD;  Location: [facility];  Service: General;  Laterality: N/A;  ? TONSILLECTOMY      ? UPPER GI ENDOSCOPY      ?  ?  ?Home Medications:  ?       ?  Prior to Admission medications   ?Medication Sig Start Date End Date Taking? Authorizing Provider  ?aspirin EC 81 MG tablet Take 81 mg by mouth daily.       [provider]  ?atorvastatin (LIPITOR) 40 MG tablet Take 1 tablet (40 mg total) by mouth daily. 11/03/21     [name], MD  ?atorvastatin (LIPITOR) 40 MG tablet Take 1 tablet by mouth once daily 11/03/21     [name], MD  ?carbamide peroxide (DEBROX) 6.5 % OTIC solution Place 5 drops into both ears 2 (two) times daily. 01/16/21     Allwardt, Randa Evens, PA-C  ?Carboxymethylcellul-Glycerin (LUBRICATING EYE DROPS OP) Place 1 drop into both eyes 3 (three) times daily as needed (dry eyes).       [provider]  ?carvedilol (COREG) 6.25 MG tablet Take 1 tablet (6.25 mg total) by mouth 2 (two) times daily with a meal. Hold on mornings you have dialysis and the night before 11/15/20     Yong Channel, [place]

## 2021-11-12 ENCOUNTER — Encounter (HOSPITAL_COMMUNITY)
Admission: EM | Disposition: A | Discharge status: Home / Self Care | Payer: Self-pay | Source: Home / Self Care | Attending: Cardiology

## 2021-11-12 ENCOUNTER — Encounter (HOSPITAL_COMMUNITY): Payer: Self-pay | Admitting: Cardiology

## 2021-11-12 DIAGNOSIS — I2510 Atherosclerotic heart disease of native coronary artery without angina pectoris: Secondary | ICD-10-CM

## 2021-11-12 HISTORY — PX: CORONARY ANGIOGRAPHY: CATH118303

## 2021-11-12 HISTORY — PX: CORONARY STENT INTERVENTION: CATH118234

## 2021-11-12 LAB — BASIC METABOLIC PANEL
Anion gap: 11 (ref 5–15)
BUN: 11 mg/dL (ref 8–23)
CO2: 28 mmol/L (ref 22–32)
Calcium: 9 mg/dL (ref 8.9–10.3)
Chloride: 97 mmol/L — ABNORMAL LOW (ref 98–111)
Creatinine, Ser: 4.41 mg/dL — ABNORMAL HIGH (ref 0.61–1.24)
GFR, Estimated: 12 mL/min — ABNORMAL LOW (ref >60)
Glucose, Bld: 168 mg/dL — ABNORMAL HIGH (ref 70–99)
Potassium: 4.7 mmol/L (ref 3.5–5.1)
Sodium: 136 mmol/L (ref 135–145)

## 2021-11-12 LAB — HEPARIN LEVEL (UNFRACTIONATED): Heparin Unfractionated: 0.31 IU/mL (ref 0.30–0.70)

## 2021-11-12 LAB — GLUCOSE, CAPILLARY
Glucose-Capillary: 162 mg/dL — ABNORMAL HIGH (ref 70–99)
Glucose-Capillary: 173 mg/dL — ABNORMAL HIGH (ref 70–99)
Glucose-Capillary: 165 mg/dL — ABNORMAL HIGH (ref 70–99)
Glucose-Capillary: 116 mg/dL — ABNORMAL HIGH (ref 70–99)

## 2021-11-12 LAB — CBC
HCT: 33.1 % — ABNORMAL LOW (ref 39.0–52.0)
Hemoglobin: 11.2 g/dL — ABNORMAL LOW (ref 13.0–17.0)
MCH: 35 pg — ABNORMAL HIGH (ref 26.0–34.0)
MCHC: 33.8 g/dL (ref 30.0–36.0)
MCV: 103.4 fL — ABNORMAL HIGH (ref 80.0–100.0)
Platelets: 97 10*3/uL — ABNORMAL LOW (ref 150–400)
RBC: 3.2 MIL/uL — ABNORMAL LOW (ref 4.22–5.81)
RDW: 15.2 % (ref 11.5–15.5)
WBC: 8.9 10*3/uL (ref 4.0–10.5)
nRBC: 0 % (ref 0.0–0.2)

## 2021-11-12 LAB — PHOSPHORUS: Phosphorus: 4.6 mg/dL (ref 2.5–4.6)

## 2021-11-12 LAB — LIPID PANEL
Cholesterol: 146 mg/dL (ref 0–200)
HDL: 35 mg/dL — ABNORMAL LOW (ref >40)
LDL Cholesterol: 64 mg/dL (ref 0–99)
Total CHOL/HDL Ratio: 4.2 RATIO
Triglycerides: 237 mg/dL — ABNORMAL HIGH (ref <150)
VLDL: 47 mg/dL — ABNORMAL HIGH (ref 0–40)

## 2021-11-12 LAB — HEPATITIS B SURFACE ANTIBODY,QUALITATIVE: Hep B S Ab: REACTIVE — AB

## 2021-11-12 LAB — POCT ACTIVATED CLOTTING TIME: Activated Clotting Time: 287 seconds

## 2021-11-12 SURGERY — CORONARY STENT INTERVENTION
Anesthesia: LOCAL

## 2021-11-12 MED ORDER — HEPARIN SODIUM (PORCINE) 1000 UNIT/ML IJ SOLN
INTRAMUSCULAR | Status: AC
  Filled 2021-11-12: qty 10

## 2021-11-12 MED ORDER — IOHEXOL 350 MG/ML SOLN
INTRAVENOUS | Status: DC | PRN
  Administered 2021-11-12: 160 mL

## 2021-11-12 MED ORDER — HEPARIN (PORCINE) IN NACL 1000-0.9 UT/500ML-% IV SOLN
INTRAVENOUS | Status: AC
  Filled 2021-11-12: qty 500

## 2021-11-12 MED ORDER — ASPIRIN 81 MG PO CHEW
81.0000 mg | CHEWABLE_TABLET | ORAL | Status: AC
Start: 2021-11-13 — End: 2021-11-12
  Administered 2021-11-12: 81 mg via ORAL
  Filled 2021-11-12: qty 1

## 2021-11-12 MED ORDER — SODIUM CHLORIDE 0.9 % IV SOLN: 250.0000 mL | INTRAVENOUS | Status: DC | PRN

## 2021-11-12 MED ORDER — SODIUM CHLORIDE 0.9% FLUSH
3.0000 mL | Freq: Two times a day (BID) | INTRAVENOUS | Status: DC
  Administered 2021-11-13: 3 mL via INTRAVENOUS

## 2021-11-12 MED ORDER — LIDOCAINE HCL (PF) 1 % IJ SOLN
INTRAMUSCULAR | Status: DC | PRN
Start: 2021-11-12 — End: 2021-11-12
  Administered 2021-11-12: 5 mL
  Administered 2021-11-12: 10 mL

## 2021-11-12 MED ORDER — HEPARIN SODIUM (PORCINE) 1000 UNIT/ML IJ SOLN
INTRAMUSCULAR | Status: DC | PRN
  Administered 2021-11-12: 9000 [IU] via INTRAVENOUS

## 2021-11-12 MED ORDER — MIDAZOLAM HCL 2 MG/2ML IJ SOLN
INTRAMUSCULAR | Status: AC
  Filled 2021-11-12: qty 2

## 2021-11-12 MED ORDER — HEPARIN (PORCINE) IN NACL 1000-0.9 UT/500ML-% IV SOLN
INTRAVENOUS | Status: DC | PRN
  Administered 2021-11-12 (×2): 500 mL

## 2021-11-12 MED ORDER — MIDAZOLAM HCL 2 MG/2ML IJ SOLN
INTRAMUSCULAR | Status: DC | PRN
  Administered 2021-11-12: 1 mg via INTRAVENOUS

## 2021-11-12 MED ORDER — CLOPIDOGREL BISULFATE 75 MG PO TABS
75.0000 mg | ORAL_TABLET | Freq: Every day | ORAL | Status: DC
  Administered 2021-11-13: 75 mg via ORAL
  Filled 2021-11-12: qty 1

## 2021-11-12 MED ORDER — CLOPIDOGREL BISULFATE 300 MG PO TABS
ORAL_TABLET | ORAL | Status: DC | PRN
  Administered 2021-11-12: 600 mg via ORAL

## 2021-11-12 MED ORDER — CLOPIDOGREL BISULFATE 300 MG PO TABS
ORAL_TABLET | ORAL | Status: AC
  Filled 2021-11-12: qty 2

## 2021-11-12 MED ORDER — SODIUM CHLORIDE 0.9 % IV SOLN: INTRAVENOUS | Status: DC

## 2021-11-12 MED ORDER — SODIUM CHLORIDE 0.9 % IV SOLN: 62.5000 mg | INTRAVENOUS | Status: DC

## 2021-11-12 MED ORDER — CALCITRIOL 0.25 MCG PO CAPS
1.7500 ug | ORAL_CAPSULE | ORAL | Status: DC
Start: 2021-11-12 — End: 2021-11-13
  Administered 2021-11-12: 1.75 ug via ORAL
  Filled 2021-11-12: qty 7

## 2021-11-12 MED ORDER — FENTANYL CITRATE (PF) 100 MCG/2ML IJ SOLN
INTRAMUSCULAR | Status: AC
  Filled 2021-11-12: qty 2

## 2021-11-12 MED ORDER — SODIUM CHLORIDE 0.9% FLUSH: 3.0000 mL | INTRAVENOUS | Status: DC | PRN

## 2021-11-12 MED ORDER — FENTANYL CITRATE (PF) 100 MCG/2ML IJ SOLN
INTRAMUSCULAR | Status: DC | PRN
Start: 2021-11-12 — End: 2021-11-12
  Administered 2021-11-12: 25 ug via INTRAVENOUS

## 2021-11-12 MED ORDER — ATORVASTATIN CALCIUM 80 MG PO TABS
80.0000 mg | ORAL_TABLET | Freq: Every day | ORAL | Status: DC
  Administered 2021-11-13: 80 mg via ORAL
  Filled 2021-11-12: qty 1

## 2021-11-12 MED ORDER — LIDOCAINE HCL (PF) 1 % IJ SOLN
INTRAMUSCULAR | Status: AC
  Filled 2021-11-12: qty 30

## 2021-11-12 MED ORDER — HEPARIN (PORCINE) IN NACL 2000-0.9 UNIT/L-% IV SOLN
INTRAVENOUS | Status: AC
  Filled 2021-11-12: qty 1000

## 2021-11-12 SURGICAL SUPPLY — 21 items
BALL SAPPHIRE NC24 3.5X12 (BALLOONS) ×2
BALLN SAPPHIRE 2.75X15 (BALLOONS) ×2
BALLOON SAPPHIRE 2.75X15 (BALLOONS) IMPLANT
BALLOON SAPPHIRE NC24 3.5X12 (BALLOONS) IMPLANT
CATH INFINITI 5FR MULTPACK ANG (CATHETERS) ×1 IMPLANT
CATH VISTA GUIDE 6FR XBLAD4 (CATHETERS) ×1 IMPLANT
CLOSURE PERCLOSE PROSTYLE (VASCULAR PRODUCTS) ×1 IMPLANT
KIT ESSENTIALS PG (KITS) ×1 IMPLANT
KIT HEART LEFT (KITS) ×2 IMPLANT
KIT MICROPUNCTURE NIT STIFF (SHEATH) ×1 IMPLANT
PACK CARDIAC CATHETERIZATION (CUSTOM PROCEDURE TRAY) ×2 IMPLANT
SHEATH PINNACLE 5F 10CM (SHEATH) ×1 IMPLANT
SHEATH PINNACLE ST 6F 45CM (SHEATH) ×1 IMPLANT
SHEATH PROBE COVER 6X72 (BAG) ×1 IMPLANT
STENT SYNERGY XD 3.0X24 (Permanent Stent) IMPLANT
SYNERGY XD 3.0X24 (Permanent Stent) ×2 IMPLANT
SYR MEDRAD MARK 7 150ML (SYRINGE) ×2 IMPLANT
TRANSDUCER W/STOPCOCK (MISCELLANEOUS) ×2 IMPLANT
TUBING CIL FLEX 10 FLL-RA (TUBING) ×2 IMPLANT
WIRE EMERALD 3MM-J .035X150CM (WIRE) ×1 IMPLANT
WIRE RUNTHROUGH .014X180CM (WIRE) ×1 IMPLANT

## 2021-11-12 NOTE — Plan of Care (Signed)
  Problem: Clinical Measurements: Goal: Will remain free from infection Outcome: Progressing Goal: Respiratory complications will improve Outcome: Progressing Goal: Cardiovascular complication will be avoided Outcome: Progressing   

## 2021-11-12 NOTE — Progress Notes (Signed)
[facility] ?Progress Note ? ?Subjective: had HD overnight, K+ 4.7 this am.   ? ?Vitals:  ? 11/12/21 0200 11/12/21 0242 11/12/21 0443 11/12/21 0752  ?BP: (!) 110/54 (!) 131/57 (!) 116/47 (!) 105/47  ?Pulse:  85 75 74  ?Resp:   18 19  ?Temp: 98 ?F (36.7 ?C) 98.1 ?F (36.7 ?C) 98 ?F (36.7 ?C) 97.7 ?F (36.5 ?C)  ?TempSrc: Oral Oral Oral Oral  ?SpO2:  96% 95% 95%  ?Weight:  95.5 kg    ? ? ?Exam: ?Gen alert, no distress, elderly  ?No jvd or bruits ?Chest clear bilat to bases ?RRR no RG ?Abd soft ntnd no mass or ascites +bs obese ?Ext no LE or UE edema ?Neuro is alert, Ox 3 , nf ?   RUA AVF+bruit ?  ? Home meds include - asa, lipitor, coreg 6.25 bid, neurontin 300 bid, remeron, renavit, zoloft, renvela 2 ac tid, prns/ vits/ supps ?  ?  ?  ? OP HD: [facility] MWF ? 4h  400/500  95.5kg  2/2 bath  Heparin 2500+ 1049midrun prn RUA AVF ? - no esa ? - rocaltrol 1.75 mcg tiw  ? - venofer 50 mg/wk ?  ? ?Assessment/ Plan: ?NSTEMI/ hx CAD - started on IV heparin, for LHC today.  ?Hyperkalemia - resolved ?ESRD - on HD MWF. Had outpt HD Monday and HD here yesterday. No HD today. Next HD Friday or possibly Thursday if needed.  ?HTN/ vol - on coreg, euvolemic on exam.  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 Cont renvela as binder and po vdra w/ HD.  ?S/p ICD ? ? ?Rob Ercell Perlman ?11/12/2021, 10:02 AM ? ? ?Recent Labs  ?Lab 11/11/21 ?0900 11/11/21 ?1102 11/11/21 ?1327 11/11/21 ?1900 11/12/21 ?6440  ?HGB 13.3  --   --   --  11.2*  ?ALBUMIN 4.0  --  3.9  --   --   ?CALCIUM 9.7 9.7  --   --  9.0  ?CREATININE 7.25* 7.33*  --   --  4.41*  ?K >7.5* 6.0*  --  5.5* 4.7  ? ?Inpatient medications: ? aspirin  324 mg Oral NOW  ? Or  ? aspirin  300 mg Rectal NOW  ? aspirin EC  81 mg Oral Daily  ? [START ON 11/13/2021] atorvastatin  80 mg Oral Daily  ? carvedilol  6.25 mg Oral BID WC  ? Chlorhexidine Gluconate Cloth  6 each Topical Q0600  ? erythromycin  1 application. Left Eye QHS  ? gabapentin  300 mg Oral BID   ? mirtazapine  3.75-7.5 mg Oral QHS  ? multivitamin  1 tablet Oral Daily  ? nitroGLYCERIN  1 inch Topical Q6H  ? pantoprazole  40 mg Oral Daily  ? sertraline  100 mg Oral Daily  ? sevelamer carbonate  1,600 mg Oral TID WC  ? sodium chloride flush  3 mL Intravenous Q12H  ? vitamin B-12  1,000 mcg Oral Daily  ? ? sodium chloride 10 mL/hr at 11/12/21 3474  ? sodium chloride    ? sodium chloride 10 mL/hr at 11/12/21 0815  ? heparin 1,150 Units/hr (11/12/21 0802)  ? ?sodium chloride, acetaminophen, nitroGLYCERIN, ondansetron (ZOFRAN) IV, sodium chloride flush ? ? ? ? ? ? ?

## 2021-11-12 NOTE — Progress Notes (Addendum)
Pt receives out-pt HD at [facility] on MWF. Pt arrives at 11:00 for 11:20 chair time. Will assist as needed.   Donnia Poplaski Renal Navigator [phone] 

## 2021-11-12 NOTE — Progress Notes (Addendum)
? ?Progress Note ? ?Patient Name: Rodney Farley ?Date of Encounter: 11/12/2021 ? ?[facility] Cardiologist: Estil [name], MD  ? ?Subjective  ? ?Patient denies chest pain, sob, palpitations. Somewhat anxious about his cath today  ? ?Inpatient Medications  ?  ?Scheduled Meds: ? aspirin  324 mg Oral NOW  ? Or  ? aspirin  300 mg Rectal NOW  ? aspirin EC  81 mg Oral Daily  ? atorvastatin  40 mg Oral Daily  ? carvedilol  6.25 mg Oral BID WC  ? Chlorhexidine Gluconate Cloth  6 each Topical Q0600  ? erythromycin  1 application. Left Eye QHS  ? gabapentin  300 mg Oral BID  ? mirtazapine  3.75-7.5 mg Oral QHS  ? multivitamin  1 tablet Oral Daily  ? nitroGLYCERIN  1 inch Topical Q6H  ? pantoprazole  40 mg Oral Daily  ? sertraline  100 mg Oral Daily  ? sevelamer carbonate  1,600 mg Oral TID WC  ? sodium chloride flush  3 mL Intravenous Q12H  ? vitamin B-12  1,000 mcg Oral Daily  ? ?Continuous Infusions: ? sodium chloride 10 mL/hr at 11/12/21 6144  ? sodium chloride    ? sodium chloride 10 mL/hr at 11/12/21 0815  ? heparin 1,150 Units/hr (11/12/21 0802)  ? ?PRN Meds: ?sodium chloride, acetaminophen, nitroGLYCERIN, ondansetron (ZOFRAN) IV, sodium chloride flush  ? ?Vital Signs  ?  ?Vitals:  ? 11/12/21 0200 11/12/21 0242 11/12/21 0443 11/12/21 0752  ?BP: (!) 110/54 (!) 131/57 (!) 116/47 (!) 105/47  ?Pulse:  85 75 74  ?Resp:   18 19  ?Temp: 98 ?F (36.7 ?C) 98.1 ?F (36.7 ?C) 98 ?F (36.7 ?C) 97.7 ?F (36.5 ?C)  ?TempSrc: Oral Oral Oral Oral  ?SpO2:  96% 95% 95%  ?Weight:  95.5 kg    ? ? ?Intake/Output Summary (Last 24 hours) at 11/12/2021 0817 ?Last data filed at 11/12/2021 [phone] ?Gross per 24 hour  ?Intake 222.27 ml  ?Output 0 ml  ?Net 222.27 ml  ? ? ?  11/12/2021  ?  2:42 AM  ?Last 3 Weights  ?Weight (lbs) 210 lb 8.6 oz  ?Weight (kg) 95.5 kg  ?   ? ?Telemetry  ?  ?AV pacing - Personally Reviewed ? ?ECG  ?  ?Atrial sensed, V paced rhythm  - Personally Reviewed ? ?Physical Exam  ? ?GEN: No acute distress.  Laying comfortably in the  bed  ?Neck: No JVD ?Cardiac: RRR, grade 2/6 systolic murmur. Radial pulses 2+ bilaterally   ?Respiratory: Clear to auscultation bilaterally. ?GI: Soft, nontender, non-distended  ?MS: No edema; No deformity. ?Neuro:  Nonfocal  ?Psych: Normal affect  ? ?Labs  ?  ?High Sensitivity Troponin:   ?Recent Labs  ?Lab 11/11/21 ?0900 11/11/21 ?1102  ?TROPONINIHS [id number]* [id number]*  ?   ?Chemistry ?Recent Labs  ?Lab 11/11/21 ?0900 11/11/21 ?1102 11/11/21 ?1327 11/11/21 ?1900 11/12/21 ?0086  ?NA 137 137  --   --  136  ?K >7.5* 6.0*  --  5.5* 4.7  ?CL 94* 94*  --   --  97*  ?CO2 25 30  --   --  28  ?GLUCOSE 247* 270*  --   --  168*  ?BUN 26* 25*  --   --  11  ?CREATININE 7.25* 7.33*  --   --  4.41*  ?CALCIUM 9.7 9.7  --   --  9.0  ?MG  --   --  2.2  --   --   ?PROT 7.4  --  7.4  --   --   ?ALBUMIN 4.0  --  3.9  --   --   ?AST 41  --  121*  --   --   ?ALT 25  --  32  --   --   ?ALKPHOS 113  --  107  --   --   ?BILITOT 0.8  --  1.2  --   --   ?GFRNONAA 7* 7*  --   --  12*  ?ANIONGAP 18* 13  --   --  11  ?  ?Lipids  ?Recent Labs  ?Lab 11/12/21 ?2992  ?CHOL 146  ?TRIG 237*  ?HDL 35*  ?[place] 64  ?CHOLHDL 4.2  ?  ?Hematology ?Recent Labs  ?Lab 11/11/21 ?0900 11/12/21 ?4268  ?WBC 7.9 8.9  ?RBC 3.86* 3.20*  ?HGB 13.3 11.2*  ?HCT 40.1 33.1*  ?MCV 103.9* 103.4*  ?MCH 34.5* 35.0*  ?MCHC 33.2 33.8  ?RDW 15.3 15.2  ?PLT 96* 97*  ? ?Thyroid  ?Recent Labs  ?Lab 11/11/21 ?1327  ?TSH 1.400  ?FREET4 0.69  ?  ?BNPNo results for input(s): BNP, PROBNP in the last 168 hours.  ?DDimer No results for input(s): DDIMER in the last 168 hours.  ? ?Radiology  ?  ?DG Chest Portable 1 View ? ?Result Date: 11/11/2021 ?CLINICAL DATA:  Chest pain and shortness of breath. EXAM: PORTABLE CHEST 1 VIEW COMPARISON:  Chest x-ray dated April 06, 2021. FINDINGS: Interval removal of the right internal jugular dialysis catheter. Unchanged left chest wall pacemaker. Stable cardiomegaly. Mild peripheral and basilar predominant coarse interstitial markings, new since the prior  study. No focal consolidation, pleural effusion, or pneumothorax. No acute osseous abnormality. IMPRESSION: 1. New mild peripheral and basilar predominant coarse interstitial markings have a more chronic appearance and may reflect mild interstitial lung disease. Interstitial pulmonary edema is considered less likely. Electronically Signed   By: Titus Dubin M.D.   On: 11/11/2021 09:38  ? ?ECHOCARDIOGRAM COMPLETE ? ?Result Date: 11/11/2021 ?   ECHOCARDIOGRAM REPORT   Patient Name:   Rodney Farley Date of Exam: 11/11/2021 Medical Rec #:  [id number]             Height:       67.0 in Accession #:    [id number]            Weight:       214.0 lb Date of Birth:  [birth year]             BSA:          2.081 m? Patient Age:    88 years              BP:           158/70 mmHg Patient Gender: M                     HR:           71 bpm. Exam Location:  Inpatient Procedure: 2D Echo, Cardiac Doppler, Color Doppler and Intracardiac            Opacification Agent Indications:    Nstemi  History:        Patient has prior history of Echocardiogram examinations, most                 recent 10/15/2017. CAD; Risk Factors:Hypertension and Diabetes.  Sonographer:    Jefferey Pica Referring Phys: [place]  1. Left ventricular ejection fraction,  by estimation, is 45 to 50%. The left ventricle has mildly decreased function. The left ventricle demonstrates regional wall motion abnormalities (see scoring diagram/findings for description). The left ventricular  internal cavity size was mildly dilated. There is mild left ventricular hypertrophy. Left ventricular diastolic parameters are consistent with Grade I diastolic dysfunction (impaired relaxation).  2. Right ventricular systolic function is normal. The right ventricular size is normal. Tricuspid regurgitation signal is inadequate for assessing PA pressure.  3. Left atrial size was moderately dilated.  4. The mitral valve is degenerative. Trivial mitral valve  regurgitation. No evidence of mitral stenosis.  5. The aortic valve was not well visualized. Aortic valve regurgitation is not visualized. Moderate aortic valve stenosis. Vmax 2.9 m/s, MG 41mmHg, AVA 1.2 cm^2, DI 0.38 FINDINGS  Left Ventricle: Left ventricular ejection fraction, by estimation, is 45 to 50%. The left ventricle has mildly decreased function. The left ventricle demonstrates regional wall motion abnormalities. The left ventricular internal cavity size was mildly dilated. There is mild left ventricular hypertrophy. Left ventricular diastolic parameters are consistent with Grade I diastolic dysfunction (impaired relaxation).  LV Wall Scoring: The apical septal segment, apical inferior segment, and apex are akinetic. The entire anterior wall, entire lateral wall, anterior septum, inferior wall, mid inferoseptal segment, and basal inferoseptal segment are normal. Right Ventricle: The right ventricular size is normal. Right vetricular wall thickness was not well visualized. Right ventricular systolic function is normal. Tricuspid regurgitation signal is inadequate for assessing PA pressure. Left Atrium: Left atrial size was moderately dilated. Right Atrium: Right atrial size was normal in size. Pericardium: There is no evidence of pericardial effusion. Mitral Valve: The mitral valve is degenerative in appearance. Trivial mitral valve regurgitation. No evidence of mitral valve stenosis. Tricuspid Valve: The tricuspid valve is normal in structure. Tricuspid valve regurgitation is not demonstrated. Aortic Valve: The aortic valve was not well visualized. Aortic valve regurgitation is not visualized. Moderate aortic stenosis is present. Aortic valve peak gradient measures 28.0 mmHg. Pulmonic Valve: The pulmonic valve was not well visualized. Pulmonic valve regurgitation is not visualized. Aorta: The aortic root and ascending aorta are structurally normal, with no evidence of dilitation. IAS/Shunts: The  interatrial septum was not well visualized.  LEFT VENTRICLE PLAX 2D LVIDd:         5.90 cm   Diastology LVIDs:         4.00 cm   LV e' lateral:   6.00 cm/s LV PW:         1.20 cm   LV E/e' lateral: 12.0 LV IVS:

## 2021-11-12 NOTE — Progress Notes (Signed)
Mobility Specialist Progress Note ? ? 11/12/21 1110  ?Mobility  ?Activity Ambulated with assistance in hallway  ?Level of Assistance Contact guard assist, steadying assist  ?Assistive Device Front wheel walker  ?Distance Ambulated (ft) 320 ft  ?Activity Response Tolerated well  ?$Mobility charge 1 Mobility  ? ?Pre Mobility: 68 HR, 96/50 BP, 97% SpO2 on RA ?During Mobility: 85 HR, BP, 89% -94% SpO2 on RA ?Post Mobility:  72 HR, 139/83 BP, 98% SpO2 on RA ? ?Received pt in bed w/o complaint and agreeable. Asx throughout ambulation but pt has slight fast gait, cued on slowing pace for energy conservation. Returned back to bed in prep for procedure later on today. ? ?Holland Falling ?Mobility Specialist ?Phone Number [phone] ? ?

## 2021-11-12 NOTE — Progress Notes (Signed)
ANTICOAGULATION CONSULT NOTE  ? ?Pharmacy Consult for heparin ?Indication: chest pain/ACS ? ?Allergies  ?Allergen Reactions  ? Bee Venom Anaphylaxis  ? Lyrica [Pregabalin] Other (See Comments)  ?  hallucinations  ? Prednisone Other (See Comments)  ?  hallucinations  ? Zocor [Simvastatin] Nausea Only and Other (See Comments)  ?  Headache with brand name only.  Can take the generic.  ? ? ?Patient Measurements: ?Weight: 95.5 kg (210 lb 8.6 oz) ?Heparin Dosing Weight: 86kg ? ?Vital Signs: ?Temp: 98 ?F (36.7 ?C) (05/03 0443) ?Temp Source: Oral (05/03 0443) ?BP: 116/47 (05/03 0443) ?Pulse Rate: 75 (05/03 0443) ? ?Labs: ?Recent Labs  ?  11/11/21 ?0900 11/11/21 ?1102 11/12/21 ?2035  ?HGB 13.3  --  11.2*  ?HCT 40.1  --  33.1*  ?PLT 96*  --  97*  ?HEPARINUNFRC  --   --  0.31  ?CREATININE 7.25* 7.33*  --   ?TROPONINIHS 1,581* 5,974*  --   ? ? ? ?Estimated Creatinine Clearance: 7.8 mL/min (A) (by C-G formula based on SCr of 7.33 mg/dL (H)). ? ? ?Medical History: ?Past Medical History:  ?Diagnosis Date  ? AICD (automatic cardioverter/defibrillator) present   ? AKI (acute kidney injury) ([place]) 03/27/2017  ? Anemia   ? Anginal pain ([place])   ? Anxiety   ? Arm pain 05/08/2015  ? LEFT ARM  ? Arthritis   ? Atrioventricular block, complete (H[facility])   ? a. 2010 s/p pacemaker.  ? Bell's palsy   ? left side. after shingles episode  ? Bilateral renal cysts 07/23/2017  ? Simple and hemorrhagic noted on CT ab/pelvis   ? BPH associated with nocturia   ? Cardiomegaly   ? Chronic systolic CHF (congestive heart failure) ([place])   ? EF normalized by Echo 2019  ? CKD (chronic kidney disease), stage IV ([place])   ? COPD (chronic obstructive pulmonary disease) ([place])   ? Severe  ? Coronary artery disease   ? a. s/p MI in 1994/1995 while in [place] s/p questionable PCI. 03/2015: progression of disease, for staged PCI.  ? Depression   ? Diabetic peripheral neuropathy ([place])   ? Diarrhea   ? Diverticulitis   ? DOE (dyspnea on exertion) 03/28/2015  ? Full dentures    ? Gallstones   ? GERD (gastroesophageal reflux disease)   ? Gout   ? Hard of hearing   ? B/L  ? Heart murmur   ? History of chronic pancreatitis 07/23/2017  ? noted on CT abd/pelvis  ? History of shingles   ? Hypercholesterolemia   ? Hypertension   ? Ischemic cardiomyopathy   ? MI (myocardial infarction) ([place]) 1994; 1995  ? Neuropathy   ? IN LOWER EXTREMITIES  ? Nosebleed 10/06/2017  ? for 2 months most recent 10/06/2017  ? Obesity   ? Pacemaker   ? med[employer]onic>>> MDT ICD 09/23/15  ? Ruptured appendicitis   ? Sleep apnea   ? "sleeps w/humidifyer when he panics and gets short of breath" (04/08/2015)  ? TIA (transient ischemic attack) X 3  ? Trigger middle finger of left hand   ? Type II diabetes mellitus ([place])   ? Wears glasses   ? Wears hearing aid   ? ?Assessment: ?56 YOM presenting with CP, hx CAD s/p PCI, he is not on anticoagulation PTA ? ?Initial heparin level low end therapeutic at 0.31 on 1100 units/hr, cards plan for cath ? ?Goal of Therapy:  ?Heparin level 0.3-0.7 units/ml ?Monitor platelets by anticoagulation protocol: Yes ?  ?Plan:  ?  Heparin gtt slight increase to 1150 units/hr ?Daily heparin level, CBC, s/s bleeding ?F/u cath  ? ?Bertis Ruddy, PharmD ?Clinical Pharmacist ?ED Pharmacist Phone # [phone] ?11/12/2021 7:15 AM ? ? ? ?

## 2021-11-12 NOTE — H&P (View-Only) (Signed)
? ?Progress Note ? ?Patient Name: Rodney Farley ?Date of Encounter: 11/12/2021 ? ?[facility] Cardiologist: Datron [name], MD  ? ?Subjective  ? ?Patient denies chest pain, sob, palpitations. Somewhat anxious about his cath today  ? ?Inpatient Medications  ?  ?Scheduled Meds: ? aspirin  324 mg Oral NOW  ? Or  ? aspirin  300 mg Rectal NOW  ? aspirin EC  81 mg Oral Daily  ? atorvastatin  40 mg Oral Daily  ? carvedilol  6.25 mg Oral BID WC  ? Chlorhexidine Gluconate Cloth  6 each Topical Q0600  ? erythromycin  1 application. Left Eye QHS  ? gabapentin  300 mg Oral BID  ? mirtazapine  3.75-7.5 mg Oral QHS  ? multivitamin  1 tablet Oral Daily  ? nitroGLYCERIN  1 inch Topical Q6H  ? pantoprazole  40 mg Oral Daily  ? sertraline  100 mg Oral Daily  ? sevelamer carbonate  1,600 mg Oral TID WC  ? sodium chloride flush  3 mL Intravenous Q12H  ? vitamin B-12  1,000 mcg Oral Daily  ? ?Continuous Infusions: ? sodium chloride 10 mL/hr at 11/12/21 7948  ? sodium chloride    ? sodium chloride 10 mL/hr at 11/12/21 0815  ? heparin 1,150 Units/hr (11/12/21 0802)  ? ?PRN Meds: ?sodium chloride, acetaminophen, nitroGLYCERIN, ondansetron (ZOFRAN) IV, sodium chloride flush  ? ?Vital Signs  ?  ?Vitals:  ? 11/12/21 0200 11/12/21 0242 11/12/21 0443 11/12/21 0752  ?BP: (!) 110/54 (!) 131/57 (!) 116/47 (!) 105/47  ?Pulse:  85 75 74  ?Resp:   18 19  ?Temp: 98 ?F (36.7 ?C) 98.1 ?F (36.7 ?C) 98 ?F (36.7 ?C) 97.7 ?F (36.5 ?C)  ?TempSrc: Oral Oral Oral Oral  ?SpO2:  96% 95% 95%  ?Weight:  95.5 kg    ? ? ?Intake/Output Summary (Last 24 hours) at 11/12/2021 0817 ?Last data filed at 11/12/2021 [phone] ?Gross per 24 hour  ?Intake 222.27 ml  ?Output 0 ml  ?Net 222.27 ml  ? ? ?  11/12/2021  ?  2:42 AM  ?Last 3 Weights  ?Weight (lbs) 210 lb 8.6 oz  ?Weight (kg) 95.5 kg  ?   ? ?Telemetry  ?  ?AV pacing - Personally Reviewed ? ?ECG  ?  ?Atrial sensed, V paced rhythm  - Personally Reviewed ? ?Physical Exam  ? ?GEN: No acute distress.  Laying comfortably in the  bed  ?Neck: No JVD ?Cardiac: RRR, grade 2/6 systolic murmur. Radial pulses 2+ bilaterally   ?Respiratory: Clear to auscultation bilaterally. ?GI: Soft, nontender, non-distended  ?MS: No edema; No deformity. ?Neuro:  Nonfocal  ?Psych: Normal affect  ? ?Labs  ?  ?High Sensitivity Troponin:   ?Recent Labs  ?Lab 11/11/21 ?0900 11/11/21 ?1102  ?TROPONINIHS [id number]* [id number]*  ?   ?Chemistry ?Recent Labs  ?Lab 11/11/21 ?0900 11/11/21 ?1102 11/11/21 ?1327 11/11/21 ?1900 11/12/21 ?5374  ?NA 137 137  --   --  136  ?K >7.5* 6.0*  --  5.5* 4.7  ?CL 94* 94*  --   --  97*  ?CO2 25 30  --   --  28  ?GLUCOSE 247* 270*  --   --  168*  ?BUN 26* 25*  --   --  11  ?CREATININE 7.25* 7.33*  --   --  4.41*  ?CALCIUM 9.7 9.7  --   --  9.0  ?MG  --   --  2.2  --   --   ?PROT 7.4  --  7.4  --   --   ?ALBUMIN 4.0  --  3.9  --   --   ?AST 41  --  121*  --   --   ?ALT 25  --  32  --   --   ?ALKPHOS 113  --  107  --   --   ?BILITOT 0.8  --  1.2  --   --   ?GFRNONAA 7* 7*  --   --  12*  ?ANIONGAP 18* 13  --   --  11  ?  ?Lipids  ?Recent Labs  ?Lab 11/12/21 ?9892  ?CHOL 146  ?TRIG 237*  ?HDL 35*  ?[place] 64  ?CHOLHDL 4.2  ?  ?Hematology ?Recent Labs  ?Lab 11/11/21 ?0900 11/12/21 ?1194  ?WBC 7.9 8.9  ?RBC 3.86* 3.20*  ?HGB 13.3 11.2*  ?HCT 40.1 33.1*  ?MCV 103.9* 103.4*  ?MCH 34.5* 35.0*  ?MCHC 33.2 33.8  ?RDW 15.3 15.2  ?PLT 96* 97*  ? ?Thyroid  ?Recent Labs  ?Lab 11/11/21 ?1327  ?TSH 1.400  ?FREET4 0.69  ?  ?BNPNo results for input(s): BNP, PROBNP in the last 168 hours.  ?DDimer No results for input(s): DDIMER in the last 168 hours.  ? ?Radiology  ?  ?DG Chest Portable 1 View ? ?Result Date: 11/11/2021 ?CLINICAL DATA:  Chest pain and shortness of breath. EXAM: PORTABLE CHEST 1 VIEW COMPARISON:  Chest x-ray dated April 06, 2021. FINDINGS: Interval removal of the right internal jugular dialysis catheter. Unchanged left chest wall pacemaker. Stable cardiomegaly. Mild peripheral and basilar predominant coarse interstitial markings, new since the prior  study. No focal consolidation, pleural effusion, or pneumothorax. No acute osseous abnormality. IMPRESSION: 1. New mild peripheral and basilar predominant coarse interstitial markings have a more chronic appearance and may reflect mild interstitial lung disease. Interstitial pulmonary edema is considered less likely. Electronically Signed   By: Titus Dubin M.D.   On: 11/11/2021 09:38  ? ?ECHOCARDIOGRAM COMPLETE ? ?Result Date: 11/11/2021 ?   ECHOCARDIOGRAM REPORT   Patient Name:   Rodney Farley Date of Exam: 11/11/2021 Medical Rec #:  [id number]             Height:       67.0 in Accession #:    [id number]            Weight:       214.0 lb Date of Birth:  Dec 27, 1931             BSA:          2.081 m? Patient Age:    88 years              BP:           158/70 mmHg Patient Gender: M                     HR:           71 bpm. Exam Location:  Inpatient Procedure: 2D Echo, Cardiac Doppler, Color Doppler and Intracardiac            Opacification Agent Indications:    Nstemi  History:        Patient has prior history of Echocardiogram examinations, most                 recent 10/15/2017. CAD; Risk Factors:Hypertension and Diabetes.  Sonographer:    Jefferey Pica Referring Phys: [place]  1. Left ventricular ejection fraction,  by estimation, is 45 to 50%. The left ventricle has mildly decreased function. The left ventricle demonstrates regional wall motion abnormalities (see scoring diagram/findings for description). The left ventricular  internal cavity size was mildly dilated. There is mild left ventricular hypertrophy. Left ventricular diastolic parameters are consistent with Grade I diastolic dysfunction (impaired relaxation).  2. Right ventricular systolic function is normal. The right ventricular size is normal. Tricuspid regurgitation signal is inadequate for assessing PA pressure.  3. Left atrial size was moderately dilated.  4. The mitral valve is degenerative. Trivial mitral valve  regurgitation. No evidence of mitral stenosis.  5. The aortic valve was not well visualized. Aortic valve regurgitation is not visualized. Moderate aortic valve stenosis. Vmax 2.9 m/s, MG 26mmHg, AVA 1.2 cm^2, DI 0.38 FINDINGS  Left Ventricle: Left ventricular ejection fraction, by estimation, is 45 to 50%. The left ventricle has mildly decreased function. The left ventricle demonstrates regional wall motion abnormalities. The left ventricular internal cavity size was mildly dilated. There is mild left ventricular hypertrophy. Left ventricular diastolic parameters are consistent with Grade I diastolic dysfunction (impaired relaxation).  LV Wall Scoring: The apical septal segment, apical inferior segment, and apex are akinetic. The entire anterior wall, entire lateral wall, anterior septum, inferior wall, mid inferoseptal segment, and basal inferoseptal segment are normal. Right Ventricle: The right ventricular size is normal. Right vetricular wall thickness was not well visualized. Right ventricular systolic function is normal. Tricuspid regurgitation signal is inadequate for assessing PA pressure. Left Atrium: Left atrial size was moderately dilated. Right Atrium: Right atrial size was normal in size. Pericardium: There is no evidence of pericardial effusion. Mitral Valve: The mitral valve is degenerative in appearance. Trivial mitral valve regurgitation. No evidence of mitral valve stenosis. Tricuspid Valve: The tricuspid valve is normal in structure. Tricuspid valve regurgitation is not demonstrated. Aortic Valve: The aortic valve was not well visualized. Aortic valve regurgitation is not visualized. Moderate aortic stenosis is present. Aortic valve peak gradient measures 28.0 mmHg. Pulmonic Valve: The pulmonic valve was not well visualized. Pulmonic valve regurgitation is not visualized. Aorta: The aortic root and ascending aorta are structurally normal, with no evidence of dilitation. IAS/Shunts: The  interatrial septum was not well visualized.  LEFT VENTRICLE PLAX 2D LVIDd:         5.90 cm   Diastology LVIDs:         4.00 cm   LV e' lateral:   6.00 cm/s LV PW:         1.20 cm   LV E/e' lateral: 12.0 LV IVS:

## 2021-11-12 NOTE — Interval H&P Note (Signed)
Cath Lab Visit (complete for each Cath Lab visit) ? ?Clinical Evaluation Leading to the Procedure:  ? ?ACS: Yes.   ? ?Non-ACS:  n/a ? ? ? ?History and Physical Interval Note: ? ?11/12/2021 ?5:50 PM ? ?Rodney Farley  has presented today for surgery, with the diagnosis of Non-Stemi.  The various methods of treatment have been discussed with the patient and family. After consideration of risks, benefits and other options for treatment, the patient has consented to  Procedure(s): ?LEFT HEART CATH AND CORONARY ANGIOGRAPHY (N/A) as a surgical intervention.  [name] history has been reviewed, patient examined, no change in status, stable for surgery.  I have reviewed the patient's chart and labs.  Questions were answered to the patient's satisfaction.   ? ? ?Kathlyn [place] ? ? ?

## 2021-11-13 ENCOUNTER — Other Ambulatory Visit (HOSPITAL_COMMUNITY): Payer: Self-pay

## 2021-11-13 ENCOUNTER — Encounter (HOSPITAL_COMMUNITY): Payer: Self-pay

## 2021-11-13 ENCOUNTER — Encounter (HOSPITAL_COMMUNITY): Payer: Self-pay | Admitting: Cardiovascular Disease

## 2021-11-13 LAB — BASIC METABOLIC PANEL
Anion gap: 11 (ref 5–15)
BUN: 19 mg/dL (ref 8–23)
CO2: 30 mmol/L (ref 22–32)
Calcium: 8.6 mg/dL — ABNORMAL LOW (ref 8.9–10.3)
Chloride: 94 mmol/L — ABNORMAL LOW (ref 98–111)
Creatinine, Ser: 6.22 mg/dL — ABNORMAL HIGH (ref 0.61–1.24)
GFR, Estimated: 8 mL/min — ABNORMAL LOW (ref >60)
Glucose, Bld: 91 mg/dL (ref 70–99)
Potassium: 4.8 mmol/L (ref 3.5–5.1)
Sodium: 135 mmol/L (ref 135–145)

## 2021-11-13 LAB — CBC
HCT: 31.2 % — ABNORMAL LOW (ref 39.0–52.0)
Hemoglobin: 10.3 g/dL — ABNORMAL LOW (ref 13.0–17.0)
MCH: 34.4 pg — ABNORMAL HIGH (ref 26.0–34.0)
MCHC: 33 g/dL (ref 30.0–36.0)
MCV: 104.3 fL — ABNORMAL HIGH (ref 80.0–100.0)
Platelets: 111 10*3/uL — ABNORMAL LOW (ref 150–400)
RBC: 2.99 MIL/uL — ABNORMAL LOW (ref 4.22–5.81)
RDW: 15.4 % (ref 11.5–15.5)
WBC: 5.9 10*3/uL (ref 4.0–10.5)
nRBC: 0 % (ref 0.0–0.2)

## 2021-11-13 LAB — HEPATITIS B SURFACE ANTIBODY, QUANTITATIVE: Hep B S AB Quant (Post): 13.2 m[IU]/mL (ref >9.9)

## 2021-11-13 MED ORDER — SEVELAMER CARBONATE 800 MG PO TABS: 1600.0000 mg | ORAL_TABLET | Freq: Three times a day (TID) | ORAL | Status: AC

## 2021-11-13 MED ORDER — ATORVASTATIN CALCIUM 80 MG PO TABS
80.0000 mg | ORAL_TABLET | Freq: Every day | ORAL | 3 refills | Status: AC
  Filled 2021-11-13 – 2022-02-13 (×2): qty 90, 90d supply, fill #0
  Filled 2022-05-14: qty 90, 90d supply, fill #1

## 2021-11-13 MED ORDER — PANTOPRAZOLE SODIUM 40 MG PO TBEC
40.0000 mg | DELAYED_RELEASE_TABLET | Freq: Every day | ORAL | 3 refills | Status: AC
  Filled 2021-11-13 – 2022-02-03 (×2): qty 90, 90d supply, fill #0
  Filled 2022-05-14: qty 90, 90d supply, fill #1

## 2021-11-13 MED ORDER — GABAPENTIN 300 MG PO CAPS: 300.0000 mg | ORAL_CAPSULE | Freq: Two times a day (BID) | ORAL | Status: DC

## 2021-11-13 MED ORDER — CALCITRIOL 0.25 MCG PO CAPS
1.7500 ug | ORAL_CAPSULE | ORAL | 1 refills | Status: DC
  Filled 2021-11-13 – 2021-12-28 (×2): qty 100, 33d supply, fill #0

## 2021-11-13 MED ORDER — NITROGLYCERIN 0.4 MG SL SUBL
0.4000 mg | SUBLINGUAL_TABLET | SUBLINGUAL | 12 refills | Status: AC | PRN
  Filled 2021-11-13 – 2022-02-06 (×2): qty 25, 7d supply, fill #0

## 2021-11-13 MED ORDER — CARBAMIDE PEROXIDE 6.5 % OT SOLN: 5.0000 [drp] | Freq: Two times a day (BID) | OTIC | Status: AC | PRN

## 2021-11-13 MED ORDER — CLOPIDOGREL BISULFATE 75 MG PO TABS
75.0000 mg | ORAL_TABLET | Freq: Every day | ORAL | 3 refills | Status: DC
  Filled 2021-11-13 – 2022-02-10 (×2): qty 90, 90d supply, fill #0

## 2021-11-13 NOTE — Discharge Summary (Signed)
?Discharge Summary  ?  ?Patient ID: Rodney Farley ?MRN: [id number]; DOB: 06/03/1932 ? ?Admit date: 11/11/2021 ?Discharge date: 11/13/2021 ? ?PCP:  [name], MD ?  ?[facility] Providers ?Cardiologist:  Caspian [name], MD   ? ? ?Discharge Diagnoses  ?  ?Principal Problem: ?  NSTEMI (non-ST elevated myocardial infarction) ([place]) ?Active Problems: ?  Pacemaker ?  Controlled type 2 diabetes mellitus with chronic kidney disease on chronic dialysis ([place]) ?  Hypertension associated with diabetes ([place]) ?  ESRD on hemodialysis ([place]) ?  Hyperlipidemia associated with type 2 diabetes mellitus ([place]) ?  Dependence on renal dialysis ([place]) ? ? ? ?Diagnostic Studies/Procedures  ?  ?LHC 11/12/21 ?  Ost 1st Diag to 1st Diag lesion is 90% stenosed. ?  Ost RCA to Prox RCA lesion is 70% stenosed. ?  3rd Mrg lesion is 10% stenosed. ?  2nd Diag-2 lesion is 10% stenosed. ?  Mid LAD lesion is 20% stenosed. ?  1st Diag lesion is 30% stenosed. ?  2nd Diag-1 lesion is 40% stenosed. ?  LPAV lesion is 40% stenosed. ?  Ost LAD to Prox LAD lesion is 90% stenosed. ?  A drug-eluting stent was successfully placed using a SYNERGY XD 3.0X24. ?  Post intervention, there is a 0% residual stenosis. ?  ?1.  Patent LAD, diagonal and OM stent with no significant restenosis.  Severe 90% stenosis in ostial/proximal LAD is a likely culprit for myocardial infarction.  70% stenosis in ostial right coronary artery but the vessel is nondominant. ?2.  Successful angioplasty and drug-eluting stent placement to the ostial/proximal LAD. ?3.  I could not cross the aortic valve due to significant iliac and aortic tortuosity and I elected not to use a straight wire given that the patient just had an echocardiogram showing moderate aortic stenosis. ?  ?Recommendations: ?Dual antiplatelet therapy for at least 12 months. ?Aggressive treatment of risk factors. ? ?Diagnostic ?Dominance: Left ?Intervention ? ? ?_____________ ?  ?History of Present Illness   ?  ?Rodney Farley is a 88 y.o. male with a PMH of MI in 64 while in [place] in 2016, CHB s/p PPM (replaced 10/09/21), ESRD on HD, DM, HTN, HLD, remote history of tobacco use, COPD, moderate OSA, chronic systolic heart failure admitted with non-ST elevation myocardial infarction. ? ?Patient reported having an initial MI in 1994 with possible PCI at that time.  Last cath in 2016 showed significant CAD.  Discussed CABG at that time, however proceeded with PCI stent to LAD, second diagonal, and OM 3 with DES.  Later underwent nuclear stress test on 10/16/2017 that was an intermediate risk study with a large, partially reversible anteroseptal and apical defect with mild-moderate ischemia.  No angina, so Imdur was increased at that time.   ? ?Patient underwent echocardiogram in 2016 with EF 25-30%.  Later echocardiogram in 2019 showed EF 60-65%, no regional wall motion abnormalities, mild MR. ? ?Patient has a history of CHB, PPM upgraded to ICD-CRT in 2017.  Underwent CRT replacement on 10/09/2021, no lead changes just generator [employer] changed. ? ?Patient presented to the hospital on 4/5 complaining of chest pain.  Pain was described as substernal and sharp with pressure radiating to left upper extremity.  Chest pain was not positional or pleuritic.  Associated with nausea, diaphoresis, dyspnea.  Pain resolved in ER with morphine.  Cardiology saw patient underwent heart catheterization this admission. ? ? ?Hospital Course  ?   ?Consultants: Nephrology   ? ?1 non-ST elevation  myocardial infarction-patient is now status post PCI of LAD.  Continue aspirin, Plavix, statin and beta-blocker.  Echocardiogram showed mildly reduced LV function (EF 45-50%)  ?  ?2 moderate aortic stenosis-patient will need follow-up echocardiogram in 1 year. ?  ?3 end-stage renal disease-dialysis dependent.  Nephrology followed this admission ?  ?4 status post ICD- Patient has follow up appointment with Dr. Caryl Comes on 7/6 ?  ?5 hypertension-blood  pressure controlled.  Continue present medications. ?  ?6 hyperlipidemia-continue statin. ? ?Did the patient have an acute coronary syndrome (MI, NSTEMI, STEMI, etc) this admission?:  Yes                              ? ?AHA/ACC Clinical Performance & Quality Measures: ?Aspirin prescribed? - Yes ?ADP Receptor Inhibitor (Plavix/Clopidogrel, Brilinta/Ticagrelor or Effient/Prasugrel) prescribed (includes medically managed patients)? - Yes ?Beta Blocker prescribed? - Yes ?High Intensity Statin (Lipitor 40-80mg  or Crestor 20-40mg ) prescribed? - Yes ?EF assessed during THIS hospitalization? - Yes ?For EF <40%, was ACEI/ARB prescribed? - Not Applicable (EF >/= 82%) ?For EF <40%, Aldosterone Antagonist (Spironolactone or Eplerenone) prescribed? - Not Applicable (EF >/= 99%) ?Cardiac Rehab Phase II ordered (including medically managed patients)? - Yes  ? ?   ?Patient was seen and evaluated by Dr. Stanford Breed and deemed stable for discharge ? ?Patient has a follow up appointment with general cardiology APP on 5/24  ?Also has a follow up with Dr. Caryl Comes on 7/6 ?  ?_____________ ? ?Discharge Vitals ?Blood pressure (!) 108/52, pulse 72, temperature 98.6 ?F (37 ?C), temperature source Oral, resp. rate 19, height 5\' 7"  (1.702 m), weight 95.4 kg, SpO2 94 %.  ?Filed Weights  ? 11/12/21 0242 11/12/21 1951  ?Weight: 95.5 kg 95.4 kg  ? ? ?Labs & Radiologic Studies  ?  ?CBC ?Recent Labs  ?  11/11/21 ?0900 11/12/21 ?3716 11/13/21 ?9678  ?WBC 7.9 8.9 5.9  ?NEUTROABS 4.3  --   --   ?HGB 13.3 11.2* 10.3*  ?HCT 40.1 33.1* 31.2*  ?MCV 103.9* 103.4* 104.3*  ?PLT 96* 97* 111*  ? ?Basic Metabolic Panel ?Recent Labs  ?  11/11/21 ?1327 11/11/21 ?1900 11/12/21 ?9381 11/13/21 ?0175  ?NA  --   --  136 135  ?K  --    < > 4.7 4.8  ?CL  --   --  97* 94*  ?CO2  --   --  28 30  ?GLUCOSE  --   --  168* 91  ?BUN  --   --  11 19  ?CREATININE  --   --  4.41* 6.22*  ?CALCIUM  --   --  9.0 8.6*  ?MG 2.2  --   --   --   ?PHOS  --   --  4.6  --   ? < > = values in  this interval not displayed.  ? ?Liver Function Tests ?Recent Labs  ?  11/11/21 ?0900 11/11/21 ?1327  ?AST 41 121*  ?ALT 25 32  ?ALKPHOS 113 107  ?BILITOT 0.8 1.2  ?PROT 7.4 7.4  ?ALBUMIN 4.0 3.9  ? ?Recent Labs  ?  11/11/21 ?0900  ?LIPASE 46  ? ?High Sensitivity Troponin:   ?Recent Labs  ?Lab 11/11/21 ?0900 11/11/21 ?1102  ?TROPONINIHS [id number]* [id number]*  ?  ?BNP ?Invalid input(s): POCBNP ?D-Dimer ?No results for input(s): DDIMER in the last 72 hours. ?Hemoglobin A1C ?Recent Labs  ?  11/11/21 ?1327  ?HGBA1C 7.5*  ? ?Fasting Lipid  Panel ?Recent Labs  ?  11/12/21 ?6468  ?CHOL 146  ?HDL 35*  ?[place]agner 64  ?TRIG 237*  ?CHOLHDL 4.2  ? ?Thyroid Function Tests ?Recent Labs  ?  11/11/21 ?1327  ?TSH 1.400  ? ?_____________  ?CARDIAC CATHETERIZATION ? ?Result Date: 11/12/2021 ?  Ost 1st Diag to 1st Diag lesion is 90% stenosed.   Ost RCA to Prox RCA lesion is 70% stenosed.   3rd Mrg lesion is 10% stenosed.   2nd Diag-2 lesion is 10% stenosed.   Mid LAD lesion is 20% stenosed.   1st Diag lesion is 30% stenosed.   2nd Diag-1 lesion is 40% stenosed.   LPAV lesion is 40% stenosed.   Ost LAD to Prox LAD lesion is 90% stenosed.   A drug-eluting stent was successfully placed using a SYNERGY XD 3.0X24.   Post intervention, there is a 0% residual stenosis. 1.  Patent LAD, diagonal and OM stent with no significant restenosis.  Severe 90% stenosis in ostial/proximal LAD is a likely culprit for myocardial infarction.  70% stenosis in ostial right coronary artery but the vessel is nondominant. 2.  Successful angioplasty and drug-eluting stent placement to the ostial/proximal LAD. 3.  I could not cross the aortic valve due to significant iliac and aortic tortuosity and I elected not to use a straight wire given that the patient just had an echocardiogram showing moderate aortic stenosis. Recommendations: Dual antiplatelet therapy for at least 12 months. Aggressive treatment of risk factors.  ? ?DG Chest Portable 1 View ? ?Result Date:  11/11/2021 ?CLINICAL DATA:  Chest pain and shortness of breath. EXAM: PORTABLE CHEST 1 VIEW COMPARISON:  Chest x-ray dated April 06, 2021. FINDINGS: Interval removal of the right internal jugular dialysis cathete

## 2021-11-13 NOTE — TOC Progression Note (Signed)
Transition of Care (TOC) - Progression Note  ? ? ?Patient Details  ?Name: Carol Loftin ?MRN: [id number] ?Date of Birth: [birth year] ? ?Transition of Care (TOC) CM/SW Contact  ?Angelita Ingles, RN ?Phone Number:[phone] ? ?11/13/2021, 12:22 PM ? ?Clinical Narrative:    ? ?Transition of Care (TOC) Screening Note ? ? ?Patient Details  ?Name: Madison Albea ?Date of Birth: 11-07-1931 ? ? ?Transition of Care (TOC) CM/SW Contact:    ?Angelita Ingles, RN ?Phone Number: ?11/13/2021, 12:23 PM ? ? ? ?Transition of Care Department [facility]) has reviewed patient and no TOC needs have been identified at this time. We will continue to monitor patient advancement through interdisciplinary progression rounds. If new patient transition needs arise, please place a TOC consult. ? ? ? ? ?  ?  ? ?Expected Discharge Plan and Services ?  ?  ?  ?  ?  ?Expected Discharge Date: 11/13/21               ?  ?  ?  ?  ?  ?  ?  ?  ?  ?  ? ? ?Social Determinants of Health (SDOH) Interventions ?  ? ?Readmission Risk Interventions ?   ? View : No data to display.  ?  ?  ?  ? ? ?

## 2021-11-13 NOTE — Progress Notes (Addendum)
[facility] ?Progress Note ? ?Subjective: had PCI to prox LAD 90% stenting yesterday. Labs okay today.  ? ?Vitals:  ? 11/12/21 2006 11/12/21 2348 11/13/21 0340 11/13/21 1017  ?BP:  (!) 100/51 (!) 100/54 118/62  ?Pulse:  70 71 70  ?Resp:  18 17 11   ?Temp:  98.3 ?F (36.8 ?C) 98.4 ?F (36.9 ?C) 98.5 ?F (36.9 ?C)  ?TempSrc:  Oral Oral Oral  ?SpO2: 91% 96% 98% 98%  ?Weight:      ?Height:      ? ? ?Exam: ?Gen alert, no distress, elderly  ?No jvd or bruits ?Chest clear bilat to bases ?RRR no RG ?Abd soft ntnd no mass or ascites +bs obese ?Ext no LE or UE edema ?Neuro is alert, Ox 3 , nf ?   RUA AVF+bruit ?  ? Home meds include - asa, lipitor, coreg 6.25 bid, neurontin 300 bid, remeron, renavit, zoloft, renvela 2 ac tid, prns/ vits/ supps ?  ?  ?  ? OP HD: [facility] M[facility] ? 4h  400/500  95.5kg  2/2 bath  Heparin 2500+ 1040midrun prn RUA AVF ? - no esa ? - rocaltrol 1.75 mcg tiw  ? - venofer 50 mg/wk ?  ? ?Assessment/ Plan: ?NSTEMI/ hx CAD/ hx stents to LAD - had new PCI/ stent to prox LAD yesterday.  ?Hyperkalemia - resolved. K+ 4.8.  ?ESRD - on HD MWF. Had HD Monday and Tuesday this week. Labs and vol are stable, does not need HD today. OK for next HD as outpt tomorrow.  ?HTN/ vol - on coreg, euvolemic on exam.  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 Cont renvela as binder and po vdra w/ HD.  ?S/p ICD ? ? ?Rodney Farley ?11/13/2021, 8:40 AM ? ? ?Recent Labs  ?Lab 11/11/21 ?0900 11/11/21 ?1102 11/11/21 ?1327 11/11/21 ?1900 11/12/21 ?5102 11/13/21 ?5852  ?HGB 13.3  --   --   --  11.2* 10.3*  ?ALBUMIN 4.0  --  3.9  --   --   --   ?CALCIUM 9.7   < >  --   --  9.0 8.6*  ?PHOS  --   --   --   --  4.6  --   ?CREATININE 7.25*   < >  --   --  4.41* 6.22*  ?K >7.5*   < >  --    < > 4.7 4.8  ? < > = values in this interval not displayed.  ? ? ?Inpatient medications: ? aspirin EC  81 mg Oral Daily  ? atorvastatin  80 mg Oral Daily  ? calcitRIOL  1.75 mcg Oral Q M,W,F-HD  ? carvedilol  6.25 mg  Oral BID WC  ? Chlorhexidine Gluconate Cloth  6 each Topical Q0600  ? clopidogrel  75 mg Oral Q breakfast  ? erythromycin  1 application. Left Eye QHS  ? gabapentin  300 mg Oral BID  ? mirtazapine  3.75-7.5 mg Oral QHS  ? multivitamin  1 tablet Oral Daily  ? pantoprazole  40 mg Oral Daily  ? sertraline  100 mg Oral Daily  ? sevelamer carbonate  1,600 mg Oral TID WC  ? sodium chloride flush  3 mL Intravenous Q12H  ? sodium chloride flush  3 mL Intravenous Q12H  ? vitamin B-12  1,000 mcg Oral Daily  ? ? sodium chloride 10 mL/hr at 11/12/21 0814  ? sodium chloride    ? [START  ON 11/14/2021] ferric gluconate (FERRLECIT) IVPB    ? ?sodium chloride, acetaminophen, nitroGLYCERIN, ondansetron (ZOFRAN) IV, sodium chloride flush ? ? ? ? ? ? ?

## 2021-11-13 NOTE — Progress Notes (Signed)
CARDIAC REHAB PHASE I  ? ?PRE:  Rate/Rhythm: 71 pacing ? ?  BP: sitting 100/46 ? ?  SaO2: 98 1 1/2L ? ?MODE:  Ambulation: 340 ft  ? ?POST:  Rate/Rhythm: 95 pacing ? ?  BP: sitting 102/52  ? ?  SaO2: 95 RA ? ?Pt with LOB upon standing without RW. Once holding RW, pt steady and aware that he struggles with turns therefore used caution. He had quick pace on straightaways. Denied SOB or CP. Very pleasant. Discussed MI, stent, Plavix importance, NTG and CRPII. Receptive, he is thinking about CRPII given he has HD on MWF. Will refer to [place] ?[phone]  ? ?Yves Dill CES, ACSM ?11/13/2021 ?9:01 AM ? ? ? ? ?

## 2021-11-13 NOTE — Progress Notes (Signed)
? ?Progress Note ? ?Patient Name: Rodney Farley ?Date of Encounter: 11/13/2021 ? ?[facility] Cardiologist: Rodney [name], MD  ? ?Subjective  ? ?No CP or dyspnea ? ?Inpatient Medications  ?  ?Scheduled Meds: ? aspirin EC  81 mg Oral Daily  ? atorvastatin  80 mg Oral Daily  ? calcitRIOL  1.75 mcg Oral Q M,W,F-HD  ? carvedilol  6.25 mg Oral BID WC  ? Chlorhexidine Gluconate Cloth  6 each Topical Q0600  ? clopidogrel  75 mg Oral Q breakfast  ? erythromycin  1 application. Left Eye QHS  ? gabapentin  300 mg Oral BID  ? mirtazapine  3.75-7.5 mg Oral QHS  ? multivitamin  1 tablet Oral Daily  ? nitroGLYCERIN  1 inch Topical Q6H  ? pantoprazole  40 mg Oral Daily  ? sertraline  100 mg Oral Daily  ? sevelamer carbonate  1,600 mg Oral TID WC  ? sodium chloride flush  3 mL Intravenous Q12H  ? sodium chloride flush  3 mL Intravenous Q12H  ? vitamin B-12  1,000 mcg Oral Daily  ? ?Continuous Infusions: ? sodium chloride 10 mL/hr at 11/12/21 0814  ? sodium chloride    ? [START ON 11/14/2021] ferric gluconate (FERRLECIT) IVPB    ? ?PRN Meds: ?sodium chloride, acetaminophen, nitroGLYCERIN, ondansetron (ZOFRAN) IV, sodium chloride flush  ? ?Vital Signs  ?  ?Vitals:  ? 11/12/21 2006 11/12/21 2348 11/13/21 0340 11/13/21 9937  ?BP:  (!) 100/51 (!) 100/54 118/62  ?Pulse:  70 71 70  ?Resp:  18 17 11   ?Temp:  98.3 ?F (36.8 ?C) 98.4 ?F (36.9 ?C) 98.5 ?F (36.9 ?C)  ?TempSrc:  Oral Oral Oral  ?SpO2: 91% 96% 98% 98%  ?Weight:      ?Height:      ? ? ?Intake/Output Summary (Last 24 hours) at 11/13/2021 0755 ?Last data filed at 11/13/2021 0700 ?Gross per 24 hour  ?Intake 600.18 ml  ?Output --  ?Net 600.18 ml  ? ? ?  11/12/2021  ?  7:51 PM 11/12/2021  ?  2:42 AM  ?Last 3 Weights  ?Weight (lbs) 210 lb 5.1 oz 210 lb 8.6 oz  ?Weight (kg) 95.4 kg 95.5 kg  ?   ? ?Telemetry  ?  ?AV Paced - Personally Reviewed ? ?Physical Exam  ? ?GEN: No acute distress.   ?Neck: No JVD ?Cardiac: RRR, 2/6 systolic murmur ?Respiratory: Clear to auscultation  bilaterally. ?GI: Soft, nontender, non-distended  ?MS: No edema ?Neuro:  Nonfocal  ?Psych: Normal affect  ? ?Labs  ?  ?High Sensitivity Troponin:   ?Recent Labs  ?Lab 11/11/21 ?0900 11/11/21 ?1102  ?TROPONINIHS [id number]* [id number]*  ?   ?Chemistry ?Recent Labs  ?Lab 11/11/21 ?0900 11/11/21 ?1102 11/11/21 ?1327 11/11/21 ?1900 11/12/21 ?1696 11/13/21 ?7893  ?NA 137 137  --   --  136 135  ?K >7.5* 6.0*  --  5.5* 4.7 4.8  ?CL 94* 94*  --   --  97* 94*  ?CO2 25 30  --   --  28 30  ?GLUCOSE 247* 270*  --   --  168* 91  ?BUN 26* 25*  --   --  11 19  ?CREATININE 7.25* 7.33*  --   --  4.41* 6.22*  ?CALCIUM 9.7 9.7  --   --  9.0 8.6*  ?MG  --   --  2.2  --   --   --   ?PROT 7.4  --  7.4  --   --   --   ?  ALBUMIN 4.0  --  3.9  --   --   --   ?AST 41  --  121*  --   --   --   ?ALT 25  --  32  --   --   --   ?ALKPHOS 113  --  107  --   --   --   ?BILITOT 0.8  --  1.2  --   --   --   ?GFRNONAA 7* 7*  --   --  12* 8*  ?ANIONGAP 18* 13  --   --  11 11  ?  ?Lipids  ?Recent Labs  ?Lab 11/12/21 ?0177  ?CHOL 146  ?TRIG 237*  ?HDL 35*  ?[place] 64  ?CHOLHDL 4.2  ?  ?Hematology ?Recent Labs  ?Lab 11/11/21 ?0900 11/12/21 ?9390 11/13/21 ?3009  ?WBC 7.9 8.9 5.9  ?RBC 3.86* 3.20* 2.99*  ?HGB 13.3 11.2* 10.3*  ?HCT 40.1 33.1* 31.2*  ?MCV 103.9* 103.4* 104.3*  ?MCH 34.5* 35.0* 34.4*  ?MCHC 33.2 33.8 33.0  ?RDW 15.3 15.2 15.4  ?PLT 96* 97* 111*  ? ?Thyroid  ?Recent Labs  ?Lab 11/11/21 ?1327  ?TSH 1.400  ?FREET4 0.69  ?  ? ?Radiology  ?  ?CARDIAC CATHETERIZATION ? ?Result Date: 11/12/2021 ?  Ost 1st Diag to 1st Diag lesion is 90% stenosed.   Ost RCA to Prox RCA lesion is 70% stenosed.   3rd Mrg lesion is 10% stenosed.   2nd Diag-2 lesion is 10% stenosed.   Mid LAD lesion is 20% stenosed.   1st Diag lesion is 30% stenosed.   2nd Diag-1 lesion is 40% stenosed.   LPAV lesion is 40% stenosed.   Ost LAD to Prox LAD lesion is 90% stenosed.   A drug-eluting stent was successfully placed using a SYNERGY XD 3.0X24.   Post intervention, there is a 0% residual  stenosis. 1.  Patent LAD, diagonal and OM stent with no significant restenosis.  Severe 90% stenosis in ostial/proximal LAD is a likely culprit for myocardial infarction.  70% stenosis in ostial right coronary artery but the vessel is nondominant. 2.  Successful angioplasty and drug-eluting stent placement to the ostial/proximal LAD. 3.  I could not cross the aortic valve due to significant iliac and aortic tortuosity and I elected not to use a straight wire given that the patient just had an echocardiogram showing moderate aortic stenosis. Recommendations: Dual antiplatelet therapy for at least 12 months. Aggressive treatment of risk factors.  ? ?DG Chest Portable 1 View ? ?Result Date: 11/11/2021 ?CLINICAL DATA:  Chest pain and shortness of breath. EXAM: PORTABLE CHEST 1 VIEW COMPARISON:  Chest x-ray dated April 06, 2021. FINDINGS: Interval removal of the right internal jugular dialysis catheter. Unchanged left chest wall pacemaker. Stable cardiomegaly. Mild peripheral and basilar predominant coarse interstitial markings, new since the prior study. No focal consolidation, pleural effusion, or pneumothorax. No acute osseous abnormality. IMPRESSION: 1. New mild peripheral and basilar predominant coarse interstitial markings have a more chronic appearance and may reflect mild interstitial lung disease. Interstitial pulmonary edema is considered less likely. Electronically Signed   By: Titus Dubin M.D.   On: 11/11/2021 09:38  ? ?ECHOCARDIOGRAM COMPLETE ? ?Result Date: 11/11/2021 ?   ECHOCARDIOGRAM REPORT   Patient Name:   Rodney Farley Date of Exam: 11/11/2021 Medical Rec #:  [id number]             Height:       67.0 in Accession #:  [id number]            Weight:       214.0 lb Date of Birth:  [birth year]             BSA:          2.081 m? Patient Age:    88 years              BP:           158/70 mmHg Patient Gender: M                     HR:           71 bpm. Exam Location:  Inpatient Procedure: 2D Echo,  Cardiac Doppler, Color Doppler and Intracardiac            Opacification Agent Indications:    Nstemi  History:        Patient has prior history of Echocardiogram examinations, most                 recent 10/15/2017. CAD; Risk Factors:Hypertension and Diabetes.  Sonographer:    Jefferey Pica Referring Phys: [place]  1. Left ventricular ejection fraction, by estimation, is 45 to 50%. The left ventricle has mildly decreased function. The left ventricle demonstrates regional wall motion abnormalities (see scoring diagram/findings for description). The left ventricular  internal cavity size was mildly dilated. There is mild left ventricular hypertrophy. Left ventricular diastolic parameters are consistent with Grade I diastolic dysfunction (impaired relaxation).  2. Right ventricular systolic function is normal. The right ventricular size is normal. Tricuspid regurgitation signal is inadequate for assessing PA pressure.  3. Left atrial size was moderately dilated.  4. The mitral valve is degenerative. Trivial mitral valve regurgitation. No evidence of mitral stenosis.  5. The aortic valve was not well visualized. Aortic valve regurgitation is not visualized. Moderate aortic valve stenosis. Vmax 2.9 m/s, MG 60mmHg, AVA 1.2 cm^2, DI 0.38 FINDINGS  Left Ventricle: Left ventricular ejection fraction, by estimation, is 45 to 50%. The left ventricle has mildly decreased function. The left ventricle demonstrates regional wall motion abnormalities. The left ventricular internal cavity size was mildly dilated. There is mild left ventricular hypertrophy. Left ventricular diastolic parameters are consistent with Grade I diastolic dysfunction (impaired relaxation).  LV Wall Scoring: The apical septal segment, apical inferior segment, and apex are akinetic. The entire anterior wall, entire lateral wall, anterior septum, inferior wall, mid inferoseptal segment, and basal inferoseptal segment are normal. Right  Ventricle: The right ventricular size is normal. Right vetricular wall thickness was not well visualized. Right ventricular systolic function is normal. Tricuspid regurgitation signal is inadequate for assessing PA pressure

## 2021-11-13 NOTE — Progress Notes (Signed)
Pt for d/c today. Contacted [facility] to make clinic aware of pt's d/c today and that pt will resume care tomorrow.  ? ?Melven Sartorius ?Renal Navigator ?[phone] ?

## 2021-11-14 LAB — LIPOPROTEIN A (LPA): Lipoprotein (a): 71.3 nmol/L — ABNORMAL HIGH (ref <75.0)

## 2021-11-17 ENCOUNTER — Telehealth (HOSPITAL_COMMUNITY): Payer: Self-pay

## 2021-11-17 NOTE — Telephone Encounter (Signed)
Pt insurance is active and benefits verified through [employer] adv Co-pay $15, DED 0/0 met, out of pocket $3,200/$3,200 met, co-insurance 0%. no pre-authorization required. Danielle/[employer], REF# [id number] ?

## 2021-11-18 NOTE — Telephone Encounter (Signed)
Great-can you please call and see if he needs refills to get to appointment-once again do not want him to run out of medicine-you may refill if needed ?

## 2021-11-18 NOTE — Telephone Encounter (Signed)
Pt scheduled for 05/11

## 2021-11-18 NOTE — Telephone Encounter (Signed)
LVM to return call.

## 2021-11-18 NOTE — Telephone Encounter (Signed)
See below

## 2021-11-20 ENCOUNTER — Encounter: Payer: Self-pay | Admitting: Family Medicine

## 2021-11-20 ENCOUNTER — Ambulatory Visit (INDEPENDENT_AMBULATORY_CARE_PROVIDER_SITE_OTHER): Payer: PPO | Admitting: Family Medicine

## 2021-11-20 VITALS — BP 100/50 | HR 86 | Temp 98.7°F | Ht 67.0 in | Wt 211.2 lb

## 2021-11-20 DIAGNOSIS — N186 End stage renal disease: Secondary | ICD-10-CM

## 2021-11-20 DIAGNOSIS — I2510 Atherosclerotic heart disease of native coronary artery without angina pectoris: Secondary | ICD-10-CM | POA: Diagnosis not present

## 2021-11-20 DIAGNOSIS — F3342 Major depressive disorder, recurrent, in full remission: Secondary | ICD-10-CM | POA: Diagnosis not present

## 2021-11-20 DIAGNOSIS — Z992 Dependence on renal dialysis: Secondary | ICD-10-CM

## 2021-11-20 DIAGNOSIS — E1122 Type 2 diabetes mellitus with diabetic chronic kidney disease: Secondary | ICD-10-CM

## 2021-11-20 NOTE — Progress Notes (Signed)
?Phone [phone] ?In person visit ?  ?Subjective:  ? ?Rodney Farley is a 88 y.o. year old very pleasant male patient who presents for/with See problem oriented charting ?Chief Complaint  ?Patient presents with  ? Follow-up  ?  Pt wants to f/u on heart medications since heart attack.  ? ? ?Past Medical History-  ?Patient Active Problem List  ? Diagnosis Date Noted  ? Ruptured appendicitis 03/26/2017  ?  Priority: High  ? Marital stress 02/18/2017  ?  Priority: High  ? COPD (chronic obstructive pulmonary disease) ([place])   ?  Priority: High  ? Chronic systolic CHF (congestive heart failure) ([place]) 04/08/2015  ?  Priority: High  ? DOE (dyspnea on exertion), due to significant CAD 03/28/2015  ?  Priority: High  ? ESRD on hemodialysis ([place]) 03/04/2015  ?  Priority: High  ? CAD (coronary artery disease) 09/13/2014  ?  Priority: High  ? Major depression, recurrent, full remission ([place]) 08/05/2012  ?  Priority: High  ? Controlled type 2 diabetes mellitus with chronic kidney disease on chronic dialysis ([place]) 08/05/2012  ?  Priority: High  ? Ischemic cardiomyopathy 11/03/2011  ?  Priority: High  ? Atrioventricular block, complete (HCC)   ?  Priority: High  ? Pacemaker   ?  Priority: High  ? Hyperlipidemia associated with type 2 diabetes mellitus ([place]) 04/13/2018  ?  Priority: Medium   ? BPH associated with nocturia 04/27/2016  ?  Priority: Medium   ? Diabetic peripheral neuropathy ([place])   ?  Priority: Medium   ? Bell's palsy   ?  Priority: Medium   ? Gout 06/26/2015  ?  Priority: Medium   ? Sleep apnea 08/05/2012  ?  Priority: Medium   ? Hypertension associated with diabetes ([place]) 08/05/2012  ?  Priority: Medium   ? S/P cholecystectomy 10/13/2017  ?  Priority: Low  ? Trigger finger 06/10/2016  ?  Priority: Low  ? Diverticulitis 04/08/2015  ?  Priority: Low  ? GERD (gastroesophageal reflux disease) 08/07/2012  ?  Priority: Low  ? Obesity   ?  Priority: Low  ? NSTEMI (non-ST elevated myocardial infarction) ([place])  11/11/2021  ? Atrial fibrillation ([place]) - SCAF 08/26/2021  ? 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 Dependence on renal dialysis ([place]) 11/13/2019  ? Coagulation defect, unspecified ([place]) 05/11/2019  ? Encounter for immunization 04/25/2019  ? Thrombocytopenia ([place]) 04/13/2019  ? Hypokalemia 04/12/2019  ? Anaphylactic shock, unspecified, initial encounter 04/10/2019  ? Anemia in chronic kidney disease 04/10/2019  ? Complication of vascular dialysis catheter 04/10/2019  ? Iron deficiency anemia, unspecified 04/10/2019  ? Secondary hyperparathyroidism of renal origin ([place]) 04/10/2019  ? Hyperkalemia, diminished renal excretion 04/01/2019  ? Diarrhea 05/11/2017  ? CKD (chronic kidney disease) stage 4, GFR 15-29 ml/min (HCC) 03/04/2015  ? Diabetes type 2, uncontrolled 08/05/2012  ? ? ?Medications- reviewed and updated ?Current Outpatient Medications  ?Medication Sig Dispense Refill  ? aspirin EC 81 MG tablet Take 81 mg by mouth daily.    ? atorvastatin (LIPITOR) 80 MG tablet Take 1 tablet (80 mg total) by mouth daily. 90 tablet 3  ? calcitRIOL (ROCALTROL) 0.25 MCG capsule Take 7 capsules (1.75 mcg total) by mouth every Monday, Wednesday, and Friday with hemodialysis. 100 capsule 1  ? carbamide peroxide (DEBROX) 6.5 % OTIC solution Place 5 drops into both ears 2 (two) times daily as needed (earwax).    ? Carboxymethylcellul-Glycerin (LUBRICATING EYE DROPS OP) Place 1 drop into  both eyes 3 (three) times daily as needed (dry eyes).    ? carvedilol (COREG) 6.25 MG tablet Take 1 tablet (6.25 mg total) by mouth 2 (two) times daily with a meal. Hold on mornings you have dialysis and the night before 180 tablet 3  ? clopidogrel (PLAVIX) 75 MG tablet Take 1 tablet (75 mg total) by mouth daily with breakfast. 90 tablet 3  ? erythromycin ophthalmic ointment Place 1 application into the left eye at bedtime.    ? gabapentin (NEURONTIN) 300 MG capsule Take 1 capsule (300 mg total) by mouth 2 (two)  times daily.    ? glucose blood (FREESTYLE TEST STRIPS) test strip Use to check blood sugar daily 100 each 3  ? glucose monitoring kit (FREESTYLE) monitoring kit USE TO MONITOR BLOOD GLUCOSE AS DIRECTED 1 each 1  ? lidocaine (XYLOCAINE) 5 % ointment Apply 1 application topically as needed. 35.44 g 5  ? Methoxy PEG-Epoetin Beta (MIRCERA IJ) as directed.    ? midodrine (PROAMATINE) 10 MG tablet Take 10 mg by mouth 3 (three) times a week.    ? mirtazapine (REMERON) 7.5 MG tablet Take 0.5-1 tablets (3.75-7.5 mg total) by mouth at bedtime. 30 tablet 5  ? multivitamin (RENA-VIT) TABS tablet Take 1 tablet by mouth daily. 90 tablet 3  ? nitroGLYCERIN (NITROSTAT) 0.4 MG SL tablet Place 1 tablet (0.4 mg total) under the tongue every 5 (five) minutes x 3 doses as needed for chest pain. 25 tablet 12  ? oxymetazoline (AFRIN) 0.05 % nasal spray Place 1 spray into both nostrils 2 (two) times daily as needed for congestion.    ? pantoprazole (PROTONIX) 40 MG tablet Take 1 tablet (40 mg total) by mouth daily. 90 tablet 3  ? saccharomyces boulardii (FLORASTOR) 250 MG capsule Take 1 capsule (250 mg total) by mouth 2 (two) times daily. 30 capsule 0  ? sertraline (ZOLOFT) 100 MG tablet TAKE 1 & 1/2 (ONE & ONE-HALF) TABLETS BY MOUTH ONCE DAILY 135 tablet 0  ? sevelamer carbonate (RENVELA) 800 MG tablet Take 2 tablets (1,600 mg total) by mouth 3 (three) times daily with meals.    ? silver sulfADIAZINE (SILVADENE) 1 % cream Apply pea-sized amount to wound daily. 50 g 0  ? triamcinolone cream (KENALOG) 0.1 % Apply 1 application topically as directed.    ? vitamin B-12 (CYANOCOBALAMIN) 1000 MCG tablet Take 1,000 mcg by mouth daily.    ? ?No current facility-administered medications for this visit.  ? ?  ?Objective:  ?BP (!) 100/50   Pulse 86   Temp 98.7 ?F (37.1 ?C)   Ht _0  (1.702 m)   Wt 211 lb 3.2 oz (95.8 kg)   SpO2 97%   BMI 33.08 kg/m?  ?Gen: NAD, resting comfortably ?CV: RRR stable murmur ?Lungs: CTAB no crackles, wheeze,  rhonchi ?Ext: trace edema ?Skin: warm, dry ?Neuro: grossly normal, moves all extremities ?- declines foot exam- q3 months podiatry and shoes taken off and socks at ESRD vistis ?  ? ?Assessment and Plan  ? ? ?#End-stage renal disease/secondary hyperparathyroidism ?S:Patient on dialysis on MWF.  Also compliant with Renvela to lower phosphorus. ?A/P: overall stable- continue follow up with nephrology  ?  ?#hyperlipidemia/CAD ?S: Medication:Atorvastatin 40 mg--> 80 mg recently, aspirin 81 mg with addition of plavix 75 mg ? ?Unfortunately had Nstemi 11/11/21- most recent stent had been placed 2016 but consideration of CABG at that time- this hospitalization had stent placed in LAD and statin increased as above ? ?Pacemaker/ICD  in place (recently replaced) and has follow up with Dr. Caryl Comes on 7/6 ? ?No more chest pain since getting home from hospital ?Lab Results  ?Component Value Date  ? CHOL 146 11/12/2021  ? HDL 35 (L) 11/12/2021  ? [place] 64 11/12/2021  ? LDLDIRECT 60.0 04/13/2018  ? TRIG 237 (H) 11/12/2021  ? CHOLHDL 4.2 11/12/2021  ? A/P: CAD asymptomatic- continue current medications  ? ?# Diabetes ?S: Medication: currently diet controlled. Has required insulin at times in past.  ?Lab Results  ?Component Value Date  ? HGBA1C 7.5 (H) 11/11/2021  ?A/P: reasonable control - want to keep a1c under 8 with overall health ? ?#hypertension ?S: medication: Carvedilol 6.25 mg twice daily (down from 12.5 mg BID but have been as low as 3.125 mg twice daily-increased 11/15/2020).  Hold on mornings of dialysis as well as evening before dialysis ?- on midodrine to help maintain BP/orthostatic symptoms on dialysis days ?A/P: blood pressure running low- discussed ok to take half tablet of carvedilol as long as blood pressure <135/85  ? ?#moderate aortic stenosis- echo planned in 1 year- noted in hospital ? ?# Depression ?S: Medication:Zoloft 150 mg.  In the past has tried Remeron half to 1 tablet at bedtime. ? ?  11/20/2021  ?  3:24 PM  08/30/2020  ?  4:25 PM 11/16/2019  ?  9:14 AM  ?Depression screen PHQ 2/9  ?Decreased Interest 0 3 3  ?Down, Depressed, Hopeless 0 3 3  ?PHQ - 2 Score 0 6 6  ?Altered sleeping 0 3 3  ?Tired, decreased energy 0 3 3  ?Change

## 2021-11-20 NOTE — Patient Instructions (Addendum)
Have eye exam faxed to [place] at [phone] once you have it done ? ?Glad you are doing well all things considered and made it through this heart event ? ?Recommended follow up: Return in about 6 months (around 05/23/2022) for followup or sooner if needed.Schedule b4 you leave. ?

## 2021-11-20 NOTE — Progress Notes (Signed)
Cardiology Office Note:    Date:  12/03/2021   ID:  Dietrich, Samuelson 11-Feb-1932, MRN [id number]  PCP:  [name], MD  Cardiologist:  Bhavik [name], MD  Electrophysiologist:  None   Referring MD: [name], MD   Chief Complaint: hospital follow-up of NSTEMI  History of Present Illness:    Rodney Farley is a 88 y.o. male with a history of CAD with remote MI in 11 while in [place], PCIs to LAD/ 2nd Diag/ OM3 in 2016, and recent NSTEMI on 11/11/2021 s/p DES to ostial to LAD; ischemic cardiomyopathy/ chronic systolic CHF with EF as low as 25-30% in 2016 with subsequent normalization and then EF of 45-50% on most recent Echo on 11/11/2021; complete heart block s/p PPM which was upgraded to ICD-CRT in 2017 (gen change in 09/2021); subclinical atrial fibrillation noted on device in 2021 not on anticoagulation, moderate aortic stenosis, hypertension; hyperlipidemia; type 2 diabetes mellitus; ESRD on hemodialysis; obstructive sleep apnea; COPD; TIA; and BPH who is followed by Dr. [name] and Dr. Caryl Comes and presents today for hospital follow-up after NSTEMI.  Patient was recently admitted from 11/11/2021 to 11/13/2021 for NSTEMI after presenting with sharp substernal chest pain radiating to the left upper extremity with associated nausea, diaphoresis, and dyspnea. High-sensitivity troponin peaked at 4,677. Echo showed LVEF of 45-50% with akinesis of the apical septal segment, apical inferior segment, and apex as well as grade 1 diastolic dysfunction and moderate aortic stenosis. LHC on 11/12/2021 90% stenosis of ostial to proximal LAD, 90% stenosis of ostial 1st Diag, and 70% stenosis of ostial to proximal RCA, and then mild disease in OM3, 2nd Diag, and LPAV. He underwent successful PCI with DES to ostial to proximal LAD lesion. He was started on DAPT with Aspirin and Plavix and home Lipitor was increased.  Of note, patient does have a history of atrial fibrillation noted on device  checks in 2021; however, decision was made not to place him on anticoagulation due to low atrial fibrillation burden. No atrial fibrillation has been detected since device interrogation in 06/2020.  Patient presents today for hospital follow-up. Here with son. He has done well from a cardiac standpoint since recent discharge. No recurrent chest pain. He has chronic dyspnea on exertion and chronic fatigue but this is stable. No shortness of breath at rest. No orthopnea or PND. He has chronic lower extremity edema but this is well controlled with dialysis. He has had problems with his BP continuing to drop low during dialysis. Son states his vision will sometimes "start to go with this." His Nephrologist recently decreased his Coreg to 3.110m. He holds this the evening before and morning of dialysis. He also states Midodrine 140mon dialysis days. He states BP is sometimes still low on evenings after dialysis. He denies any lightheadedness, dizziness, or near syncope outside dialysis. No palpitations. He is tolerating his medications well. No abnormal bleeding on DAPT.  Past Medical History:  Diagnosis Date   AICD (automatic cardioverter/defibrillator) present    Anemia    Anxiety    Arthritis    At[place], complete ([facility]   a. 2010 s/p pacemaker.   Bell's palsy    left side. after shingles episode   Bilateral renal cysts 07/23/2017   Simple and hemorrhagic noted on CT ab/pelvis    BPH associated with nocturia    Chronic systolic CHF (congestive heart failure) ([facility]   EF normalized by Echo 2019   COPD (chronic obstructive  pulmonary disease) ([place])    Severe   Coronary artery disease    a. s/p MI in 1994/1995 while in [place] s/p questionable PCI. 03/2015: progression of disease, for staged PCI.   Depression    Diabetic peripheral neuropathy (HCC)    Diverticulitis    Full dentures    Gallstones    GERD (gastroesophageal reflux disease)    Gout    Hard of hearing    B/L    History of chronic pancreatitis 07/23/2017   noted on CT abd/pelvis   History of shingles    Hypercholesterolemia    Hypertension    Ischemic cardiomyopathy    MI (myocardial infarction) ([place]) 1994; 1995   Neuropathy    IN LOWER EXTREMITIES   Nosebleed 10/06/2017   for 2 months most recent 10/06/2017   Obesity    Pacemaker    medtronic>>> MDT ICD 09/23/15   Ruptured appendicitis    Sleep apnea    "sleeps w/humidifyer when he panics and gets short of breath" (04/08/2015)   TIA (transient ischemic attack) X 3   Trigger middle finger of left hand    Type II diabetes mellitus ([place])    Wears glasses    Wears hearing aid     Past Surgical History:  Procedure Laterality Date   APPENDECTOMY     AV FISTULA PLACEMENT Right 02/07/2019   Procedure: ARTERIOVENOUS (AV) FISTULA CREATION RIGHT UPPER ARM;  Surgeon: Waynetta Sandy, MD;  Location: [facility];  Service: Vascular;  Laterality: Right;   BIV ICD GENERATOR CHANGEOUT N/A 10/09/2021   Procedure: BIV ICD GENERATOR CHANGEOUT;  Surgeon: Deboraha Sprang, MD;  Location: [facility];  Service: Cardiovascular;  Laterality: N/A;   CARDIAC CATHETERIZATION N/A 03/29/2015   Procedure: Right/Left Heart Cath and Coronary Angiography;  Surgeon: Evren M [name], MD;  Location: [facility];  Service: Cardiovascular;  Laterality: N/A;   [place]   "after my MI; put me on heart RX after cath"   CARDIAC CATHETERIZATION N/A 04/09/2015   Procedure: Coronary Stent Intervention;  Surgeon: Nature M [name], MD;  Location: [facility];  Service: Cardiovascular;  Laterality: N/A;   CATARACT EXTRACTION W/ INTRAOCULAR LENS  IMPLANT, BILATERAL     CHOLECYSTECTOMY N/A 10/13/2017   Procedure: LAPAROSCOPIC CHOLECYSTECTOMY WITH LYSIS OF ADHESIONS;  Surgeon: Ileana Roup, MD;  Location: [facility];  Service: General;  Laterality: N/A;   COLONOSCOPY     CORONARY ANGIOGRAPHY N/A 11/12/2021   Procedure: CORONARY ANGIOGRAPHY;   Surgeon: Wellington Hampshire, MD;  Location: [facility];  Service: Cardiovascular;  Laterality: N/A;   CORONARY STENT INTERVENTION N/A 11/12/2021   Procedure: CORONARY STENT INTERVENTION;  Surgeon: Wellington Hampshire, MD;  Location: [facility];  Service: Cardiovascular;  Laterality: N/A;  LAD   DENTAL SURGERY     EP IMPLANTABLE DEVICE N/A 09/23/2015   MDT CRT-D, Dr. Caryl Comes   HIATAL HERNIA REPAIR  1977   ILEOCECETOMY N/A 03/27/2017   Procedure: ILEOCECECTOMY;  Surgeon: Ileana Roup, MD;  Location: [facility];  Service: General;  Laterality: N/A;   INSERT / REPLACE / REMOVE PACEMAKER  07/2008   Complete heart block status post DDD with good function   IR FLUORO GUIDE CV LINE RIGHT  04/03/2019   IR US GUIDE VASC ACCESS RIGHT  04/03/2019   LAPAROTOMY N/A 03/27/2017   Procedure: EXPLORATORY LAPAROTOMY;  Surgeon: Ileana Roup, MD;  Location: [facility];  Service: General;  Laterality: N/A;   TONSILLECTOMY     UPPER GI ENDOSCOPY      Current Medications: Current Meds  Medication Sig   aspirin EC 81 MG tablet Take 81 mg by mouth daily.   atorvastatin (LIPITOR) 80 MG tablet Take 1 tablet (80 mg total) by mouth daily.   calcitRIOL (ROCALTROL) 0.25 MCG capsule Take 7 capsules (1.75 mcg total) by mouth every Monday, Wednesday, and Friday with hemodialysis.   carbamide peroxide (DEBROX) 6.5 % OTIC solution Place 5 drops into both ears 2 (two) times daily as needed (earwax).   Carboxymethylcellul-Glycerin (LUBRICATING EYE DROPS OP) Place 1 drop into both eyes 3 (three) times daily as needed (dry eyes).   carvedilol (COREG) 3.125 MG tablet Take twice daily. Hold the evening before and the morning of dialysis.   clopidogrel (PLAVIX) 75 MG tablet Take 1 tablet (75 mg total) by mouth daily with breakfast.   erythromycin ophthalmic ointment Place 1 application into the left eye at bedtime.   gabapentin (NEURONTIN) 300 MG capsule Take 1 capsule (300 mg total) by mouth 2 (two) times daily.    glucose blood (FREESTYLE TEST STRIPS) test strip Use to check blood sugar daily   glucose monitoring kit (FREESTYLE) monitoring kit USE TO MONITOR BLOOD GLUCOSE AS DIRECTED   lidocaine (XYLOCAINE) 5 % ointment Apply 1 application topically as needed.   Methoxy PEG-Epoetin Beta (MIRCERA IJ) as directed.   mirtazapine (REMERON) 7.5 MG tablet Take 0.5-1 tablets (3.75-7.5 mg total) by mouth at bedtime.   multivitamin (RENA-VIT) TABS tablet Take 1 tablet by mouth daily.   nitroGLYCERIN (NITROSTAT) 0.4 MG SL tablet Place 1 tablet (0.4 mg total) under the tongue every 5 (five) minutes x 3 doses as needed for chest pain.   oxymetazoline (AFRIN) 0.05 % nasal spray Place 1 spray into both nostrils 2 (two) times daily as needed for congestion.   pantoprazole (PROTONIX) 40 MG tablet Take 1 tablet (40 mg total) by mouth daily.   saccharomyces boulardii (FLORASTOR) 250 MG capsule Take 1 capsule (250 mg total) by mouth 2 (two) times daily.   sertraline (ZOLOFT) 100 MG tablet TAKE 1 & 1/2 (ONE & ONE-HALF) TABLETS BY MOUTH ONCE DAILY   sevelamer carbonate (RENVELA) 800 MG tablet Take 2 tablets (1,600 mg total) by mouth 3 (three) times daily with meals.   silver sulfADIAZINE (SILVADENE) 1 % cream Apply pea-sized amount to wound daily.   triamcinolone cream (KENALOG) 0.1 % Apply 1 application topically as directed.   vitamin B-12 (CYANOCOBALAMIN) 1000 MCG tablet Take 1,000 mcg by mouth daily.   [DISCONTINUED] carvedilol (COREG) 6.25 MG tablet Take 1 tablet (6.25 mg total) by mouth 2 (two) times daily with a meal. Hold on mornings you have dialysis and the night before   [DISCONTINUED] midodrine (PROAMATINE) 10 MG tablet Take 10 mg by mouth 3 (three) times a week.     Allergies:   Bee venom, Lyrica [pregabalin], Prednisone, and Zocor [simvastatin]   Social History   Socioeconomic History   Marital status: Legally Separated    Spouse name: Not on file   Number of children: 1   Years of education: college    Highest education level: Not on file  Occupational History   Occupation: Retired  Tobacco Use   Smoking status: Former    Packs/day: 1.50    Years: 54.00    Pack years: 81.00    Types: Cigarettes    Quit date: 07/18/2007    Years since quitting: 27.3  Passive exposure: Never   Smokeless tobacco: Never  Vaping Use   Vaping Use: Never used  Substance and Sexual Activity   Alcohol use: Yes    Comment: rare   Drug use: No   Sexual activity: Never  Other Topics Concern   Not on file  Social History Narrative   Married 1994 (together since 1989) 1 son, 1 stepson. 3 grandkids, 6 great grandkids      Retired from [employer]. Had 2 years of collge.       Faith: Mormon      Here on [address] since 2004 from [place]; wife recently left and [place] [facility]    Social Determinants of Health   Financial Resource Strain: Not on [employer] Insecurity: Not on file  Transportation Needs: Not on file  Physical Activity: Not on file  Stress: Not on file  Social Connections: Not on file     Family History: [name] family history includes Heart attack in his brother; Leukemia in his father; Stroke in his mother and sister.  ROS:   Please see the history of present illness.     EKGs/Labs/Other Studies Reviewed:    The following studies were reviewed today:  Echocardiogram 11/11/2021: Impressions:  1. Left ventricular ejection fraction, by estimation, is 45 to 50%. The  left ventricle has mildly decreased function. The left ventricle  demonstrates regional wall motion abnormalities (see scoring  diagram/findings for description). The left ventricular   internal cavity size was mildly dilated. There is mild left ventricular  hypertrophy. Left ventricular diastolic parameters are consistent with  Grade I diastolic dysfunction (impaired relaxation).   2. Right ventricular systolic function is normal. The right ventricular  size is normal. Tricuspid regurgitation  signal is inadequate for assessing  PA pressure.   3. Left atrial size was moderately dilated.   4. The mitral valve is degenerative. Trivial mitral valve regurgitation.  No evidence of mitral stenosis.   5. The aortic valve was not well visualized. Aortic valve regurgitation  is not visualized. Moderate aortic valve stenosis. Vmax 2.9 m/s, MG  39mHg, AVA 1.2 cm^2, DI 0.38. _______________  Coronary Stent Intervention 11/12/2021:   Ost 1st Diag to 1st Diag lesion is 90% stenosed.   Ost RCA to Prox RCA lesion is 70% stenosed.   3rd Mrg lesion is 10% stenosed.   2nd Diag-2 lesion is 10% stenosed.   Mid LAD lesion is 20% stenosed.   1st Diag lesion is 30% stenosed.   2nd Diag-1 lesion is 40% stenosed.   LPAV lesion is 40% stenosed.   Ost LAD to Prox LAD lesion is 90% stenosed.   A drug-eluting stent was successfully placed using a SYNERGY XD 3.0X24.   Post intervention, there is a 0% residual stenosis.   1.  Patent LAD, diagonal and OM stent with no significant restenosis.  Severe 90% stenosis in ostial/proximal LAD is a likely culprit for myocardial infarction.  70% stenosis in ostial right coronary artery but the vessel is nondominant. 2.  Successful angioplasty and drug-eluting stent placement to the ostial/proximal LAD. 3.  I could not cross the aortic valve due to significant iliac and aortic tortuosity and I elected not to use a straight wire given that the patient just had an echocardiogram showing moderate aortic stenosis.   Recommendations: Dual antiplatelet therapy for at least 12 months. Aggressive treatment of risk factors.  Diagnostic Dominance: Left Intervention    EKG:  EKG  ordered today. EKG personally  reviewed and demonstrates AV paced rhythm. Rate 70 bpm. No acute changes compared to prior tracings.   Recent Labs: 11/11/2021: ALT 32; Magnesium 2.2; TSH 1.400 11/13/2021: BUN 19; Creatinine, Ser 6.22; Hemoglobin 10.3; Platelets 111; Potassium 4.8; Sodium 135  Recent  Lipid Panel    Component Value Date/Time   CHOL 146 11/12/2021 0554   TRIG 237 (H) 11/12/2021 0554   HDL 35 (L) 11/12/2021 0554   CHOLHDL 4.2 11/12/2021 0554   VLDL 47 (H) 11/12/2021 0554   LDLCALC 64 11/12/2021 0554   LDLDIRECT 60.0 04/13/2018 0926    Physical Exam:    Vital Signs: BP 132/62   Pulse 70   Ht 5' 7" (1.702 m)   Wt 221 lb 3.2 oz (100.3 kg)   SpO2 98%   BMI 34.64 kg/m     Wt Readings from Last 3 Encounters:  12/03/21 221 lb 3.2 oz (100.3 kg)  11/20/21 211 lb 3.2 oz (95.8 kg)  11/12/21 210 lb 5.1 oz (95.4 kg)     General: 88 y.o. Caucasian male in no acute distress. HEENT: Normocephalic and atraumatic. Sclera clear. EOMs intact. Neck: Supple. No carotid bruits. No JVD. Heart: RRR. Distinct S1 and S2. III/VI systolic murmur loudest at right upper sternal border. No  gallops or rubs. Radial pulses 2+ and equal bilaterally. Right femoral cath site soft with no signs of hematoma.  Lungs: No increased work of breathing. Clear to ausculation bilaterally.  Abdomen: Soft, non-distended, and non-tender to palpation.  MSK: Ambulates with a walker. Extremities: Trace lower extremity edema bilaterally. Skin: Warm and dry. Neuro: Alert and oriented x3. No focal deficits. Psych: Normal affect. Responds appropriately.  Assessment:    1. Coronary artery disease involving native coronary artery of native heart without angina pectoris   2. History of non-ST elevation myocardial infarction (NSTEMI)   3. Ischemic cardiomyopathy   4. Chronic systolic CHF (congestive heart failure) ([place])   5. Heart block AV complete (HCC)   6. Paroxysmal atrial fibrillation ([place])   7. Primary hypertension   8. Hyperlipidemia, unspecified hyperlipidemia type   9. Type 2 diabetes mellitus with complication, without long-term current use of insulin ([place])   10. ESRD (end stage renal disease) ([place])     Plan:    CAD with Recent NSTEMI Patient was recently admitted with NSTEMI and underwent  PCI/DES to ostial to proximal LAD. Prior stents mid LAD, 2nd Diag, and OM3 patent. - No recurrent chest pain. - Continue DAPT with Aspirin and Plavix. - Continue beta-blocker and high-sensitivity statin.  Ischemic Cardiomyopathy Chronic Systolic CHF Echo during recent admission showed LVEF 45-50% with akinesis of the apical septal segment, apical inferior segment, and apex as well as grade 1 diastolic dysfunction. - Euvolemic on exam.  - Volume status is being managed by hemodialysis.  - Continue Coreg 3.159m twice daily. He holds this on night before and morning of dialysis. - No ACEi/ARB due to renal function.  Moderate Aortic Stenosis Noted on Echo during recent admission. - Will continue serial monitoring as an outpatient. Will likely need repeat Echo in 1-2 years.  Complete Heart Block S/p ICD-CRT with recent gen change in 09/2021. - Followed by Dr. KCaryl Comes  Subclinical Atrial Fibrillation History of atrial fibrillation noted on prior device check in 2021. Not on anticoagulation due to low atrial fibrillation burden. No atrial fibrillation noted on device interrogations since 06/2020.   Hypertension BP well controlled in office today; however, he has hypotension with dialysis. - Continue Coreg as above.  -  Takes Midodrine 35m on dialysis days. Advised patient that it is OK to take an additional 534mof Midodrine on evenings after dialysis as needed for low BP. - We may ultimately have to stop Coreg completely if he continues to have significant hypotension with dialysis despite the Midodrine.   Hyperlipidemia Lipid panel during recent admission: Total Cholesterol 146, Triglycerides 257, HDL 35, LDL 64. Lipoprotein A 71.3. LDL goal <55. - Lipitor was increased to 8040muring recent admission. Continue. - Will repeat lipid panel and LFTs in 6-8 weeks.   Type 2 Diabetes Mellitus Hemoglobin A1c 7.5 during recent admission.  - Management per PCP.  ESRD On hemodialysis on M/W/F.  Takes Midodrine on dialysis days. - Management per Nephrology.  Disposition: Follow up in 3-4 months with Dr. JorMartinique Medication Adjustments/Labs and Tests Ordered: Current medicines are reviewed at length with the patient today.  Concerns regarding medicines are outlined above.  Orders Placed This Encounter  Procedures   Lipid panel   Hepatic function panel   EKG 12-Lead   Meds ordered this encounter  Medications   carvedilol (COREG) 3.125 MG tablet    Sig: Take twice daily. Hold the evening before and the morning of dialysis.    Dispense:  180 tablet    Refill:  3   midodrine (PROAMATINE) 10 MG tablet    Sig: Take 1 tablet (10 mg) three times a week. Add 5 mg a night after dialysis as needed for low blood pressure.    Dispense:  24 tablet    Refill:  3    Patient Instructions  Medication Instructions:  Decrease carvedilol to 3.125 mg twice daily. Hold the evening before and the morning of dialysis. 2.   Add 5 mg of midodrine at night after dialysis as needed for low blood pressure. *If you need a refill on your cardiac medications before your next appointment, please call your pharmacy*   Lab Work: In 2 months, return for fasting blood work (lipid and liver panels) If you have labs (blood work) drawn today and your tests are completely normal, you will receive your results only by: [place] you have MyChart) OR A paper copy in the mail If you have any lab test that is abnormal or we need to change your treatment, we will call you to review the results.   Follow-Up: [facility] and [facility] needs are our priority.  As part of our continuing mission to provide you with exceptional heart care, we have created designated Provider Care Teams.  These Care Teams include your primary Cardiologist (physician) and Advanced Practice Providers (APPs -  Physician Assistants and Nurse Practitioners) who all work together to provide you with the care you need,  when you need it.  We recommend signing up for the patient portal called "MyChart".  Sign up information is provided on this After Visit Summary.  MyChart is used to connect with patients for Virtual Visits (Telemedicine).  Patients are able to view lab/test results, encounter notes, upcoming appointments, etc.  Non-urgent messages can be sent to your provider as well.   To learn more about what you can do with MyChart, go to [email or web address]  Tuesday March 24, 2022 at 9:40  The format for your next appointment:   In Person  Provider:   Agamjot JorMartiniqueD       Signed, CalDarreld McleanA-C  12/03/2021 12:50 PM    [place]

## 2021-11-21 ENCOUNTER — Other Ambulatory Visit: Payer: Self-pay | Admitting: Family Medicine

## 2021-11-21 DIAGNOSIS — F32A Depression, unspecified: Secondary | ICD-10-CM

## 2021-11-21 MED ORDER — SERTRALINE HCL 100 MG PO TABS: ORAL_TABLET | ORAL | 3 refills | Status: DC

## 2021-11-25 ENCOUNTER — Other Ambulatory Visit: Payer: Self-pay | Admitting: Family Medicine

## 2021-11-26 NOTE — Telephone Encounter (Signed)
Called and lm on pt vm tcb. 

## 2021-11-26 NOTE — Telephone Encounter (Signed)
Last filled by historical. ?

## 2021-11-29 ENCOUNTER — Encounter: Payer: Self-pay | Admitting: Student

## 2021-11-29 DIAGNOSIS — Z8673 Personal history of transient ischemic attack (TIA), and cerebral infarction without residual deficits: Secondary | ICD-10-CM | POA: Insufficient documentation

## 2021-12-01 ENCOUNTER — Other Ambulatory Visit (HOSPITAL_BASED_OUTPATIENT_CLINIC_OR_DEPARTMENT_OTHER): Payer: Self-pay

## 2021-12-02 ENCOUNTER — Telehealth (HOSPITAL_BASED_OUTPATIENT_CLINIC_OR_DEPARTMENT_OTHER): Payer: Self-pay

## 2021-12-02 ENCOUNTER — Other Ambulatory Visit (HOSPITAL_BASED_OUTPATIENT_CLINIC_OR_DEPARTMENT_OTHER): Payer: Self-pay

## 2021-12-02 NOTE — Telephone Encounter (Signed)
Transitions of Care Pharmacy   Call attempted for a pharmacy transitions of care follow-up. HIPAA appropriate voicemail was left with call back information provided.   Call attempt #1. Will follow-up in 2-3 days.   Darcus Austin, [facility] 12/02/2021 12:00 PM

## 2021-12-03 ENCOUNTER — Ambulatory Visit: Payer: PPO | Admitting: Student

## 2021-12-03 ENCOUNTER — Encounter: Payer: Self-pay | Admitting: Student

## 2021-12-03 VITALS — BP 132/62 | HR 70 | Ht 67.0 in | Wt 221.2 lb

## 2021-12-03 DIAGNOSIS — I252 Old myocardial infarction: Secondary | ICD-10-CM

## 2021-12-03 DIAGNOSIS — I255 Ischemic cardiomyopathy: Secondary | ICD-10-CM | POA: Diagnosis not present

## 2021-12-03 DIAGNOSIS — I2510 Atherosclerotic heart disease of native coronary artery without angina pectoris: Secondary | ICD-10-CM | POA: Diagnosis not present

## 2021-12-03 DIAGNOSIS — I5022 Chronic systolic (congestive) heart failure: Secondary | ICD-10-CM | POA: Diagnosis not present

## 2021-12-03 DIAGNOSIS — E785 Hyperlipidemia, unspecified: Secondary | ICD-10-CM

## 2021-12-03 DIAGNOSIS — N186 End stage renal disease: Secondary | ICD-10-CM

## 2021-12-03 DIAGNOSIS — I480 Paroxysmal atrial fibrillation: Secondary | ICD-10-CM

## 2021-12-03 DIAGNOSIS — I442 Atrioventricular block, complete: Secondary | ICD-10-CM

## 2021-12-03 DIAGNOSIS — I10 Essential (primary) hypertension: Secondary | ICD-10-CM

## 2021-12-03 DIAGNOSIS — Z8673 Personal history of transient ischemic attack (TIA), and cerebral infarction without residual deficits: Secondary | ICD-10-CM

## 2021-12-03 DIAGNOSIS — E118 Type 2 diabetes mellitus with unspecified complications: Secondary | ICD-10-CM

## 2021-12-03 MED ORDER — MIDODRINE HCL 10 MG PO TABS: ORAL_TABLET | ORAL | 3 refills | Status: DC

## 2021-12-03 MED ORDER — CARVEDILOL 3.125 MG PO TABS: ORAL_TABLET | ORAL | 3 refills | Status: DC

## 2021-12-03 NOTE — Patient Instructions (Addendum)
Medication Instructions:  Decrease carvedilol to 3.125 mg twice daily. Hold the evening before and the morning of dialysis. 2.   Add 5 mg of midodrine at night after dialysis as needed for low blood pressure. *If you need a refill on your cardiac medications before your next appointment, please call your pharmacy*   Lab Work: In 2 months, return for fasting blood work (lipid and liver panels) If you have labs (blood work) drawn today and your tests are completely normal, you will receive your results only by: [place] (if you have MyChart) OR A paper copy in the mail If you have any lab test that is abnormal or we need to change your treatment, we will call you to review the results.   Follow-Up: At [facility], you and your health needs are our priority.  As part of our continuing mission to provide you with exceptional heart care, we have created designated Provider Care Teams.  These Care Teams include your primary Cardiologist (physician) and Advanced Practice Providers (APPs -  Physician Assistants and Nurse Practitioners) who all work together to provide you with the care you need, when you need it.  We recommend signing up for the patient portal called "MyChart".  Sign up information is provided on this After Visit Summary.  MyChart is used to connect with patients for Virtual Visits (Telemedicine).  Patients are able to view lab/test results, encounter notes, upcoming appointments, etc.  Non-urgent messages can be sent to your provider as well.   To learn more about what you can do with MyChart, go to [email or web address].    Tuesday March 24, 2022 at 9:40  The format for your next appointment:   In Person  Provider:   Pascal [name], MD

## 2021-12-11 ENCOUNTER — Other Ambulatory Visit (HOSPITAL_BASED_OUTPATIENT_CLINIC_OR_DEPARTMENT_OTHER): Payer: Self-pay

## 2021-12-11 ENCOUNTER — Telehealth (HOSPITAL_BASED_OUTPATIENT_CLINIC_OR_DEPARTMENT_OTHER): Payer: Self-pay

## 2021-12-11 NOTE — Telephone Encounter (Signed)
Transitions of Care Pharmacy   Call attempted for a pharmacy transitions of care follow-up. HIPAA appropriate voicemail was left with call back information provided.   Call attempt #2. Will follow-up in 2-3 days.   Darcus Austin, [place] 12/11/2021 8:50 AM

## 2021-12-17 ENCOUNTER — Telehealth (HOSPITAL_COMMUNITY): Payer: Self-pay | Admitting: Pharmacist

## 2021-12-17 ENCOUNTER — Other Ambulatory Visit (HOSPITAL_COMMUNITY): Payer: Self-pay

## 2021-12-23 ENCOUNTER — Encounter (HOSPITAL_COMMUNITY): Payer: Self-pay | Admitting: Emergency Medicine

## 2021-12-23 ENCOUNTER — Other Ambulatory Visit: Payer: Self-pay

## 2021-12-23 ENCOUNTER — Inpatient Hospital Stay (HOSPITAL_COMMUNITY)
Admission: EM | Admit: 2021-12-23 | Discharge: 2021-12-25 | DRG: 250 | Disposition: A | Discharge status: Home / Self Care | Payer: PPO | Attending: Family Medicine | Admitting: Family Medicine

## 2021-12-23 ENCOUNTER — Emergency Department (HOSPITAL_COMMUNITY): Payer: PPO

## 2021-12-23 DIAGNOSIS — E2089 Other specified hypoparathyroidism: Secondary | ICD-10-CM

## 2021-12-23 DIAGNOSIS — Z91030 Bee allergy status: Secondary | ICD-10-CM

## 2021-12-23 DIAGNOSIS — I132 Hypertensive heart and chronic kidney disease with heart failure and with stage 5 chronic kidney disease, or end stage renal disease: Secondary | ICD-10-CM | POA: Diagnosis present

## 2021-12-23 DIAGNOSIS — Z9049 Acquired absence of other specified parts of digestive tract: Secondary | ICD-10-CM

## 2021-12-23 DIAGNOSIS — Z950 Presence of cardiac pacemaker: Secondary | ICD-10-CM | POA: Diagnosis present

## 2021-12-23 DIAGNOSIS — I214 Non-ST elevation (NSTEMI) myocardial infarction: Secondary | ICD-10-CM | POA: Diagnosis not present

## 2021-12-23 DIAGNOSIS — D631 Anemia in chronic kidney disease: Secondary | ICD-10-CM | POA: Diagnosis present

## 2021-12-23 DIAGNOSIS — I442 Atrioventricular block, complete: Secondary | ICD-10-CM | POA: Diagnosis present

## 2021-12-23 DIAGNOSIS — Z8619 Personal history of other infectious and parasitic diseases: Secondary | ICD-10-CM

## 2021-12-23 DIAGNOSIS — M109 Gout, unspecified: Secondary | ICD-10-CM | POA: Diagnosis present

## 2021-12-23 DIAGNOSIS — I255 Ischemic cardiomyopathy: Secondary | ICD-10-CM | POA: Diagnosis present

## 2021-12-23 DIAGNOSIS — Z635 Disruption of family by separation and divorce: Secondary | ICD-10-CM

## 2021-12-23 DIAGNOSIS — I5022 Chronic systolic (congestive) heart failure: Secondary | ICD-10-CM

## 2021-12-23 DIAGNOSIS — I200 Unstable angina: Secondary | ICD-10-CM | POA: Diagnosis not present

## 2021-12-23 DIAGNOSIS — I252 Old myocardial infarction: Secondary | ICD-10-CM

## 2021-12-23 DIAGNOSIS — Z6834 Body mass index (BMI) 34.0-34.9, adult: Secondary | ICD-10-CM

## 2021-12-23 DIAGNOSIS — Z961 Presence of intraocular lens: Secondary | ICD-10-CM | POA: Diagnosis present

## 2021-12-23 DIAGNOSIS — N2581 Secondary hyperparathyroidism of renal origin: Secondary | ICD-10-CM | POA: Diagnosis present

## 2021-12-23 DIAGNOSIS — E119 Type 2 diabetes mellitus without complications: Secondary | ICD-10-CM

## 2021-12-23 DIAGNOSIS — F32A Depression, unspecified: Secondary | ICD-10-CM

## 2021-12-23 DIAGNOSIS — Z992 Dependence on renal dialysis: Secondary | ICD-10-CM

## 2021-12-23 DIAGNOSIS — G4700 Insomnia, unspecified: Secondary | ICD-10-CM | POA: Diagnosis present

## 2021-12-23 DIAGNOSIS — E7800 Pure hypercholesterolemia, unspecified: Secondary | ICD-10-CM | POA: Diagnosis present

## 2021-12-23 DIAGNOSIS — E669 Obesity, unspecified: Secondary | ICD-10-CM

## 2021-12-23 DIAGNOSIS — I350 Nonrheumatic aortic (valve) stenosis: Secondary | ICD-10-CM | POA: Diagnosis not present

## 2021-12-23 DIAGNOSIS — Z7982 Long term (current) use of aspirin: Secondary | ICD-10-CM

## 2021-12-23 DIAGNOSIS — F419 Anxiety disorder, unspecified: Secondary | ICD-10-CM | POA: Diagnosis present

## 2021-12-23 DIAGNOSIS — K219 Gastro-esophageal reflux disease without esophagitis: Secondary | ICD-10-CM | POA: Diagnosis present

## 2021-12-23 DIAGNOSIS — J449 Chronic obstructive pulmonary disease, unspecified: Secondary | ICD-10-CM | POA: Diagnosis present

## 2021-12-23 DIAGNOSIS — M1990 Unspecified osteoarthritis, unspecified site: Secondary | ICD-10-CM | POA: Diagnosis present

## 2021-12-23 DIAGNOSIS — Z66 Do not resuscitate: Secondary | ICD-10-CM | POA: Diagnosis present

## 2021-12-23 DIAGNOSIS — I959 Hypotension, unspecified: Secondary | ICD-10-CM

## 2021-12-23 DIAGNOSIS — G4733 Obstructive sleep apnea (adult) (pediatric): Secondary | ICD-10-CM | POA: Diagnosis present

## 2021-12-23 DIAGNOSIS — Z955 Presence of coronary angioplasty implant and graft: Secondary | ICD-10-CM

## 2021-12-23 DIAGNOSIS — Z823 Family history of stroke: Secondary | ICD-10-CM

## 2021-12-23 DIAGNOSIS — Z8249 Family history of ischemic heart disease and other diseases of the circulatory system: Secondary | ICD-10-CM

## 2021-12-23 DIAGNOSIS — I4891 Unspecified atrial fibrillation: Secondary | ICD-10-CM | POA: Diagnosis present

## 2021-12-23 DIAGNOSIS — E1142 Type 2 diabetes mellitus with diabetic polyneuropathy: Secondary | ICD-10-CM | POA: Diagnosis present

## 2021-12-23 DIAGNOSIS — E875 Hyperkalemia: Secondary | ICD-10-CM | POA: Diagnosis present

## 2021-12-23 DIAGNOSIS — Z9842 Cataract extraction status, left eye: Secondary | ICD-10-CM

## 2021-12-23 DIAGNOSIS — E8889 Other specified metabolic disorders: Secondary | ICD-10-CM | POA: Diagnosis present

## 2021-12-23 DIAGNOSIS — E1122 Type 2 diabetes mellitus with diabetic chronic kidney disease: Secondary | ICD-10-CM | POA: Diagnosis present

## 2021-12-23 DIAGNOSIS — I5042 Chronic combined systolic (congestive) and diastolic (congestive) heart failure: Secondary | ICD-10-CM | POA: Diagnosis present

## 2021-12-23 DIAGNOSIS — Z79899 Other long term (current) drug therapy: Secondary | ICD-10-CM

## 2021-12-23 DIAGNOSIS — E785 Hyperlipidemia, unspecified: Secondary | ICD-10-CM | POA: Diagnosis not present

## 2021-12-23 DIAGNOSIS — Z888 Allergy status to other drugs, medicaments and biological substances status: Secondary | ICD-10-CM

## 2021-12-23 DIAGNOSIS — Z9861 Coronary angioplasty status: Secondary | ICD-10-CM

## 2021-12-23 DIAGNOSIS — Z8673 Personal history of transient ischemic attack (TIA), and cerebral infarction without residual deficits: Secondary | ICD-10-CM

## 2021-12-23 DIAGNOSIS — Z87891 Personal history of nicotine dependence: Secondary | ICD-10-CM

## 2021-12-23 DIAGNOSIS — D696 Thrombocytopenia, unspecified: Secondary | ICD-10-CM | POA: Diagnosis present

## 2021-12-23 DIAGNOSIS — R351 Nocturia: Secondary | ICD-10-CM | POA: Diagnosis present

## 2021-12-23 DIAGNOSIS — K861 Other chronic pancreatitis: Secondary | ICD-10-CM | POA: Diagnosis present

## 2021-12-23 DIAGNOSIS — I25110 Atherosclerotic heart disease of native coronary artery with unstable angina pectoris: Secondary | ICD-10-CM | POA: Diagnosis present

## 2021-12-23 DIAGNOSIS — I953 Hypotension of hemodialysis: Secondary | ICD-10-CM | POA: Diagnosis not present

## 2021-12-23 DIAGNOSIS — Z7902 Long term (current) use of antithrombotics/antiplatelets: Secondary | ICD-10-CM

## 2021-12-23 DIAGNOSIS — N401 Enlarged prostate with lower urinary tract symptoms: Secondary | ICD-10-CM | POA: Diagnosis present

## 2021-12-23 DIAGNOSIS — Z9841 Cataract extraction status, right eye: Secondary | ICD-10-CM

## 2021-12-23 DIAGNOSIS — I259 Chronic ischemic heart disease, unspecified: Principal | ICD-10-CM

## 2021-12-23 DIAGNOSIS — E208 Other hypoparathyroidism: Secondary | ICD-10-CM | POA: Diagnosis present

## 2021-12-23 DIAGNOSIS — N186 End stage renal disease: Secondary | ICD-10-CM

## 2021-12-23 DIAGNOSIS — Z95810 Presence of automatic (implantable) cardiac defibrillator: Secondary | ICD-10-CM | POA: Diagnosis present

## 2021-12-23 DIAGNOSIS — Z974 Presence of external hearing-aid: Secondary | ICD-10-CM

## 2021-12-23 LAB — BASIC METABOLIC PANEL
Anion gap: 17 — ABNORMAL HIGH (ref 5–15)
BUN: 41 mg/dL — ABNORMAL HIGH (ref 8–23)
CO2: 27 mmol/L (ref 22–32)
Calcium: 9.9 mg/dL (ref 8.9–10.3)
Chloride: 93 mmol/L — ABNORMAL LOW (ref 98–111)
Creatinine, Ser: 7.95 mg/dL — ABNORMAL HIGH (ref 0.61–1.24)
GFR, Estimated: 6 mL/min — ABNORMAL LOW (ref >60)
Glucose, Bld: 226 mg/dL — ABNORMAL HIGH (ref 70–99)
Potassium: 6.2 mmol/L — ABNORMAL HIGH (ref 3.5–5.1)
Sodium: 137 mmol/L (ref 135–145)

## 2021-12-23 LAB — CBC
HCT: 38.1 % — ABNORMAL LOW (ref 39.0–52.0)
Hemoglobin: 12.5 g/dL — ABNORMAL LOW (ref 13.0–17.0)
MCH: 35.5 pg — ABNORMAL HIGH (ref 26.0–34.0)
MCHC: 32.8 g/dL (ref 30.0–36.0)
MCV: 108.2 fL — ABNORMAL HIGH (ref 80.0–100.0)
Platelets: 108 10*3/uL — ABNORMAL LOW (ref 150–400)
RBC: 3.52 MIL/uL — ABNORMAL LOW (ref 4.22–5.81)
RDW: 15.8 % — ABNORMAL HIGH (ref 11.5–15.5)
WBC: 7.5 10*3/uL (ref 4.0–10.5)
nRBC: 0 % (ref 0.0–0.2)

## 2021-12-23 LAB — TROPONIN I (HIGH SENSITIVITY)
Troponin I (High Sensitivity): 223 ng/L (ref <18)
Troponin I (High Sensitivity): 653 ng/L (ref <18)

## 2021-12-23 LAB — CBG MONITORING, ED: Glucose-Capillary: 246 mg/dL — ABNORMAL HIGH (ref 70–99)

## 2021-12-23 LAB — POTASSIUM: Potassium: 5.5 mmol/L — ABNORMAL HIGH (ref 3.5–5.1)

## 2021-12-23 MED ORDER — MIDODRINE HCL 5 MG PO TABS: 5.0000 mg | ORAL_TABLET | ORAL | Status: DC

## 2021-12-23 MED ORDER — INSULIN ASPART 100 UNIT/ML IV SOLN
5.0000 [IU] | Freq: Once | INTRAVENOUS | Status: AC
  Administered 2021-12-23: 5 [IU] via INTRAVENOUS

## 2021-12-23 MED ORDER — SODIUM ZIRCONIUM CYCLOSILICATE 10 G PO PACK
10.0000 g | PACK | Freq: Once | ORAL | Status: AC
  Administered 2021-12-24: 10 g via ORAL
  Filled 2021-12-23: qty 1

## 2021-12-23 MED ORDER — PANTOPRAZOLE SODIUM 40 MG PO TBEC
40.0000 mg | DELAYED_RELEASE_TABLET | Freq: Every day | ORAL | Status: DC
  Administered 2021-12-25: 40 mg via ORAL
  Filled 2021-12-23: qty 1

## 2021-12-23 MED ORDER — SACCHAROMYCES BOULARDII 250 MG PO CAPS
250.0000 mg | ORAL_CAPSULE | Freq: Two times a day (BID) | ORAL | Status: DC
  Administered 2021-12-23 – 2021-12-25 (×3): 250 mg via ORAL
  Filled 2021-12-23 (×4): qty 1

## 2021-12-23 MED ORDER — SODIUM CHLORIDE 0.9% FLUSH
3.0000 mL | Freq: Two times a day (BID) | INTRAVENOUS | Status: DC
  Administered 2021-12-23 – 2021-12-24 (×2): 3 mL via INTRAVENOUS

## 2021-12-23 MED ORDER — ONDANSETRON HCL 4 MG/2ML IJ SOLN: 4.0000 mg | Freq: Four times a day (QID) | INTRAMUSCULAR | Status: DC | PRN

## 2021-12-23 MED ORDER — NITROGLYCERIN 0.4 MG SL SUBL
0.4000 mg | SUBLINGUAL_TABLET | SUBLINGUAL | Status: DC | PRN
  Administered 2021-12-23: 0.4 mg via SUBLINGUAL
  Filled 2021-12-23: qty 1

## 2021-12-23 MED ORDER — GABAPENTIN 300 MG PO CAPS: 300.0000 mg | ORAL_CAPSULE | Freq: Every day | ORAL | Status: DC

## 2021-12-23 MED ORDER — SODIUM BICARBONATE 8.4 % IV SOLN
50.0000 meq | Freq: Once | INTRAVENOUS | Status: AC
  Administered 2021-12-23: 50 meq via INTRAVENOUS
  Filled 2021-12-23: qty 50

## 2021-12-23 MED ORDER — HEPARIN BOLUS VIA INFUSION
4000.0000 [IU] | Freq: Once | INTRAVENOUS | Status: AC
Start: 2021-12-23 — End: 2021-12-23
  Administered 2021-12-23: 4000 [IU] via INTRAVENOUS
  Filled 2021-12-23: qty 4000

## 2021-12-23 MED ORDER — GABAPENTIN 300 MG PO CAPS
300.0000 mg | ORAL_CAPSULE | Freq: Every day | ORAL | Status: DC
  Administered 2021-12-23 – 2021-12-24 (×2): 300 mg via ORAL
  Filled 2021-12-23 (×2): qty 1

## 2021-12-23 MED ORDER — VITAMIN B-12 1000 MCG PO TABS
1000.0000 ug | ORAL_TABLET | Freq: Every day | ORAL | Status: DC
  Administered 2021-12-25: 1000 ug via ORAL
  Filled 2021-12-23: qty 1

## 2021-12-23 MED ORDER — CALCITRIOL 0.25 MCG PO CAPS: 1.7500 ug | ORAL_CAPSULE | ORAL | Status: DC

## 2021-12-23 MED ORDER — INSULIN ASPART 100 UNIT/ML IJ SOLN: 0.0000 [IU] | Freq: Three times a day (TID) | INTRAMUSCULAR | Status: DC

## 2021-12-23 MED ORDER — SODIUM CHLORIDE 0.9 % IV SOLN: 250.0000 mL | INTRAVENOUS | Status: DC | PRN

## 2021-12-23 MED ORDER — RENA-VITE PO TABS
1.0000 | ORAL_TABLET | Freq: Every day | ORAL | Status: DC
  Administered 2021-12-25: 1 via ORAL
  Filled 2021-12-23: qty 1

## 2021-12-23 MED ORDER — SODIUM CHLORIDE 0.9 % IV SOLN: INTRAVENOUS | Status: DC

## 2021-12-23 MED ORDER — CALCIUM GLUCONATE-NACL 1-0.675 GM/50ML-% IV SOLN
1.0000 g | Freq: Once | INTRAVENOUS | Status: AC
Start: 2021-12-23 — End: 2021-12-23
  Administered 2021-12-23: 1000 mg via INTRAVENOUS
  Filled 2021-12-23: qty 50

## 2021-12-23 MED ORDER — SEVELAMER CARBONATE 800 MG PO TABS
1600.0000 mg | ORAL_TABLET | Freq: Three times a day (TID) | ORAL | Status: DC
  Administered 2021-12-24 – 2021-12-25 (×3): 1600 mg via ORAL
  Filled 2021-12-23 (×4): qty 2

## 2021-12-23 MED ORDER — DEXTROSE 50 % IV SOLN
1.0000 | Freq: Once | INTRAVENOUS | Status: AC
  Administered 2021-12-23: 50 mL via INTRAVENOUS
  Filled 2021-12-23: qty 50

## 2021-12-23 MED ORDER — MIDODRINE HCL 5 MG PO TABS
10.0000 mg | ORAL_TABLET | ORAL | Status: DC
  Administered 2021-12-24 (×2): 10 mg via ORAL
  Filled 2021-12-23 (×2): qty 2

## 2021-12-23 MED ORDER — MIDODRINE HCL 5 MG PO TABS
5.0000 mg | ORAL_TABLET | Freq: Every day | ORAL | Status: DC | PRN
  Filled 2021-12-23: qty 1

## 2021-12-23 MED ORDER — SODIUM CHLORIDE 0.9% FLUSH: 3.0000 mL | INTRAVENOUS | Status: DC | PRN

## 2021-12-23 MED ORDER — CARBOXYMETHYLCELLUL-GLYCERIN 0.5-0.9 % OP SOLN
1.0000 [drp] | Freq: Three times a day (TID) | OPHTHALMIC | Status: DC | PRN
  Filled 2021-12-23: qty 15

## 2021-12-23 MED ORDER — HEPARIN (PORCINE) 25000 UT/250ML-% IV SOLN
1200.0000 [IU]/h | INTRAVENOUS | Status: DC
  Administered 2021-12-23: 1200 [IU]/h via INTRAVENOUS
  Filled 2021-12-23: qty 250

## 2021-12-23 MED ORDER — ACETAMINOPHEN 325 MG PO TABS: 650.0000 mg | ORAL_TABLET | ORAL | Status: DC | PRN

## 2021-12-23 MED ORDER — SODIUM ZIRCONIUM CYCLOSILICATE 10 G PO PACK
10.0000 g | PACK | Freq: Once | ORAL | Status: AC
  Administered 2021-12-23: 10 g via ORAL
  Filled 2021-12-23: qty 1

## 2021-12-23 MED ORDER — CLOPIDOGREL BISULFATE 75 MG PO TABS
75.0000 mg | ORAL_TABLET | Freq: Every day | ORAL | Status: DC
  Administered 2021-12-24 – 2021-12-25 (×2): 75 mg via ORAL
  Filled 2021-12-23 (×2): qty 1

## 2021-12-23 MED ORDER — ASPIRIN 81 MG PO TBEC
81.0000 mg | DELAYED_RELEASE_TABLET | Freq: Every day | ORAL | Status: DC
  Administered 2021-12-24: 81 mg via ORAL
  Filled 2021-12-23: qty 1

## 2021-12-23 MED ORDER — ERYTHROMYCIN 5 MG/GM OP OINT
1.0000 | TOPICAL_OINTMENT | Freq: Every day | OPHTHALMIC | Status: DC
Start: 2021-12-23 — End: 2021-12-25
  Administered 2021-12-24: 1 via OPHTHALMIC
  Filled 2021-12-23: qty 3.5

## 2021-12-23 MED ORDER — ATORVASTATIN CALCIUM 80 MG PO TABS
80.0000 mg | ORAL_TABLET | Freq: Every day | ORAL | Status: DC
  Administered 2021-12-23 – 2021-12-24 (×2): 80 mg via ORAL
  Filled 2021-12-23: qty 2
  Filled 2021-12-23: qty 1

## 2021-12-23 NOTE — ED Notes (Signed)
Some oozing was noted around the site of the 18G IV, it was running down his arm.  I cleaned the blood, flushed the IV successfully, and wrapped 1 layer of Coban gently over the IV site in hopes of slowing the ooze.

## 2021-12-23 NOTE — ED Notes (Signed)
Pt has been notified of his improved potassium. His CP continues to be resolved after the 1 SL nitro earlier and he is tucked in and ready to rest.

## 2021-12-23 NOTE — ED Notes (Signed)
Please call [facility]) at [phone] with any updates or questions.

## 2021-12-23 NOTE — Progress Notes (Signed)
Pharmacy Consult for heparin gtt  Indication: chest pain/ACS  Allergies  Allergen Reactions   Bee Venom Anaphylaxis   Lyrica [Pregabalin] Other (See Comments)    hallucinations   Prednisone Other (See Comments)    hallucinations   Zocor [Simvastatin] Nausea Only and Other (See Comments)    Headache with brand name only.  Can take the generic.    Patient Measurements:   Heparin Dosing Weight: 88 kg    Vital Signs: Temp: 98.4 F (36.9 C) (06/13 1113) Temp Source: Oral (06/13 1113) BP: 108/52 (06/13 1530) Pulse Rate: 65 (06/13 1530)  Labs: Recent Labs    12/23/21 1122 12/23/21 1320  HGB 12.5*  --   HCT 38.1*  --   PLT 108*  --   CREATININE  --  7.95*    CrCl cannot be calculated (Unknown ideal weight.).  Assessment: Rodney Farley a 88 y.o. male with PMH significant for CAD with MI, PCI to LAD/2nd diag/OM3 in 2016, recent NSTEMI 11/2021. He presented with to the ED c/o chest pain, nausea, diaphoreses, now with concern for chest pain/ACS. Pharmacy has been consulted for heparin dosing.   Anticoagulation PTA: Patient not on anticoagulation PTA.  Goal of Therapy:  Heparin level 0.3-0.7 units/ml Monitor platelets by anticoagulation protocol: Yes   Plan:  Heparin bolus via infusion 4,000 Units x1  Start heparin infusion at 1200 units/hour Check heparin level in 8 hours and daily while on heparin, daily CBC  Monitor for signs and symptoms of bleeding    Adria Dill, PharmD PGY-1 Acute Care Resident  12/23/2021 4:37 PM

## 2021-12-23 NOTE — ED Notes (Signed)
Pt provided a bag meal and some decaf coffee and water.  He did not drink all of coffee or water trying to be mindful of fluid intake,

## 2021-12-23 NOTE — Consult Note (Signed)
[facility] Renal Consultation Note  Requesting MD: Cordelia Poche, MD Indication for Consultation:  ESRD, hyperkalemia  Chief complaint: chest pain  HPI:  Rodney Farley is a 88 y.o. male with a history of ESRD, CAD, ischemic cardiomyopathy, HLD, T2DM, HTN, and severe COPD who presented to the ER with nonexertional chest pressure.  "It felt just like the others" - last MI in early May 2023.  This was accompanied by pain radiating down his left arm which began this morning.  He tried three 81 mg aspirin tablets and gabapentin and pain didn't improve - worsened. He thought it was indigestion.  He states around 8:30 am when no improvement he was worried he was having a heart attack so he called his daughter-in-law and she called EMS.  EMS gave him nitro and he vomited.  Cardiology has been consulted.  He states that his chest pain is currently zero/10.  He had shortness of breath and was sweating this AM but this has resolved.  He states "breathing is normal."  Note that he had an admission in early May for chest pain and NSTEMI.  He had left heart cath on 11/12/21 and DES placement.  He does dialysis Monday Wednesday Friday at [facility] and did have HD on 6/12.  Nephrology is consulted for end-stage renal disease and hyperkalemia.  His potassium was 6.2 on presentation.  ER ordered calcium gluconate and his admitting team gave him Lokelma 10 g.    Outpatient HD orders as below.  Last post weight - 95.7 kg on 12/22/21. Earlier this month was above EDW (98.9 kg post weight on 6/5) but has come down over time and met or approached EDW recently.  He states that he had an extra HD treatment last week on Thursday to optimize fluid.  He had half a banana this weekend that he had prepared for guests.  He clarifies he is on midodrine 5 mg pre-HD MWF and was instructed to take midodrine 2.5 mg if hypotension on his off-days per his PCP.  PMHx:   Past Medical History:  Diagnosis Date   AICD  (automatic cardioverter/defibrillator) present    Anemia    Anxiety    Arthritis    Atrioventricular block, complete ([place])    a. 2010 s/p pacemaker.   Bell's palsy    left side. after shingles episode   Bilateral renal cysts 07/23/2017   Simple and hemorrhagic noted on CT ab/pelvis    BPH associated with nocturia    Chronic systolic CHF (congestive heart failure) ([place])    EF normalized by Echo 2019   COPD (chronic obstructive pulmonary disease) ([place])    Severe   Coronary artery disease    a. s/p MI in 1994/1995 while in [place] s/p questionable PCI. 03/2015: progression of disease, for staged PCI.   Depression    Diabetic peripheral neuropathy (HCC)    Diverticulitis    Full dentures    Gallstones    GERD (gastroesophageal reflux disease)    Gout    Hard of hearing    B/L   History of chronic pancreatitis 07/23/2017   noted on CT abd/pelvis   History of shingles    Hypercholesterolemia    Hypertension    Ischemic cardiomyopathy    MI (myocardial infarction) ([place]) 1994; 1995   Neuropathy    IN LOWER EXTREMITIES   Nosebleed 10/06/2017   for 2 months most recent 10/06/2017   Obesity    Pacemaker    medtronic>>> MDT ICD  09/23/15   Ruptured appendicitis    Sleep apnea    "sleeps w/humidifyer when he panics and gets short of breath" (04/08/2015)   TIA (transient ischemic attack) X 3   Trigger middle finger of left hand    Type II diabetes mellitus ([place])    Wears glasses    Wears hearing aid     Past Surgical History:  Procedure Laterality Date   APPENDECTOMY     AV FISTULA PLACEMENT Right 02/07/2019   Procedure: ARTERIOVENOUS (AV) FISTULA CREATION RIGHT UPPER ARM;  Surgeon: Waynetta Sandy, MD;  Location: [facility];  Service: Vascular;  Laterality: Right;   BIV ICD GENERATOR CHANGEOUT N/A 10/09/2021   Procedure: BIV ICD GENERATOR CHANGEOUT;  Surgeon: Deboraha Sprang, MD;  Location: [facility];  Service: Cardiovascular;  Laterality: N/A;   CARDIAC  CATHETERIZATION N/A 03/29/2015   Procedure: Right/Left Heart Cath and Coronary Angiography;  Surgeon: Ryler M [name], MD;  Location: [facility];  Service: Cardiovascular;  Laterality: N/A;   [place]   "after my MI; put me on heart RX after cath"   CARDIAC CATHETERIZATION N/A 04/09/2015   Procedure: Coronary Stent Intervention;  Surgeon: Allah M [name], MD;  Location: [facility];  Service: Cardiovascular;  Laterality: N/A;   CATARACT EXTRACTION W/ INTRAOCULAR LENS  IMPLANT, BILATERAL     CHOLECYSTECTOMY N/A 10/13/2017   Procedure: LAPAROSCOPIC CHOLECYSTECTOMY WITH LYSIS OF ADHESIONS;  Surgeon: Ileana Roup, MD;  Location: [facility];  Service: General;  Laterality: N/A;   COLONOSCOPY     CORONARY ANGIOGRAPHY N/A 11/12/2021   Procedure: CORONARY ANGIOGRAPHY;  Surgeon: Wellington Hampshire, MD;  Location: [facility];  Service: Cardiovascular;  Laterality: N/A;   CORONARY STENT INTERVENTION N/A 11/12/2021   Procedure: CORONARY STENT INTERVENTION;  Surgeon: Wellington Hampshire, MD;  Location: [facility];  Service: Cardiovascular;  Laterality: N/A;  [facility]   DENTAL SURGERY     EP IMPLANTABLE DEVICE N/A 09/23/2015   MDT CRT-D, Dr. Caryl Comes   HIATAL HERNIA REPAIR  1977   ILEOCECETOMY N/A 03/27/2017   Procedure: ILEOCECECTOMY;  Surgeon: Ileana Roup, MD;  Location: [facility];  Service: General;  Laterality: N/A;   INSERT / REPLACE / REMOVE PACEMAKER  07/2008   Complete heart block status post DDD with good function   IR FLUORO GUIDE CV LINE RIGHT  04/03/2019   IR US GUIDE VASC ACCESS RIGHT  04/03/2019   LAPAROTOMY N/A 03/27/2017   Procedure: EXPLORATORY LAPAROTOMY;  Surgeon: Ileana Roup, MD;  Location: [facility];  Service: General;  Laterality: N/A;   TONSILLECTOMY     UPPER GI ENDOSCOPY      Family Hx:  Family History  Problem Relation Age of Onset   Stroke Mother    Leukemia Father    Stroke Sister    Heart attack Brother   Denies family history  of ESRD  Social History:  reports that he quit smoking about 14 years ago. His smoking use included cigarettes. He has a 81.00 pack-year smoking history. He has never been exposed to tobacco smoke. He has never used smokeless tobacco. He reports current alcohol use. He reports that he does not use drugs.  Allergies:  Allergies  Allergen Reactions   Bee Venom Anaphylaxis   Lyrica [Pregabalin] Other (See Comments)    hallucinations   Prednisone Other (See Comments)    hallucinations   Zocor [Simvastatin] Nausea Only and Other (See Comments)  Headache with brand name only.  Can take the generic.    Medications: Prior to Admission medications   Medication Sig Start Date End Date Taking? Authorizing Provider  aspirin EC 81 MG tablet Take 81 mg by mouth daily.   Yes [provider]  atorvastatin (LIPITOR) 80 MG tablet Take 1 tablet (80 mg total) by mouth daily. Patient taking differently: Take 80 mg by mouth at bedtime. 11/14/21  Yes Margie Billet, PA-C  calcitRIOL (ROCALTROL) 0.25 MCG capsule Take 7 capsules (1.75 mcg total) by mouth every Monday, Wednesday, and Friday with hemodialysis. 11/14/21  Yes Margie Billet, PA-C  carbamide peroxide (DEBROX) 6.5 % OTIC solution Place 5 drops into both ears 2 (two) times daily as needed (earwax). 11/13/21  Yes Lelon Perla, MD  Carboxymethylcellul-Glycerin (LUBRICATING EYE DROPS OP) Place 1 drop into both eyes 3 (three) times daily as needed (dry eyes).   Yes [provider]  carvedilol (COREG) 3.125 MG tablet Take twice daily. Hold the evening before and the morning of dialysis. Patient taking differently: Take 3.125 mg by mouth See admin instructions. Sunday: Take 1 tablet in the morning Monday: Take 1 tablet after Dialysis Tuesday: Take 1 tablet in the morning Wednesday: Take 1 tablet after Dialysis Thursday: Take 1 tablet in the morning Friday: Take 1 tablet after Dialysis Saturday: Take 1 tablet two times a day  12/03/21  Yes Sande Rives E, PA-C  clopidogrel (PLAVIX) 75 MG tablet Take 1 tablet (75 mg total) by mouth daily with breakfast. 11/14/21  Yes Margie Billet, PA-C  erythromycin ophthalmic ointment Place 1 application into the left eye at bedtime.   Yes [provider]  gabapentin (NEURONTIN) 300 MG capsule Take 1 capsule (300 mg total) by mouth 2 (two) times daily. 11/13/21  Yes Lelon Perla, MD  midodrine (PROAMATINE) 10 MG tablet Take 1 tablet (10 mg) three times a week. Add 5 mg a night after dialysis as needed for low blood pressure. Patient taking differently: Take 5-10 mg by mouth See admin instructions. Take 1 tablet (10 mg) three times a week. Add 5 mg a night after dialysis as needed for low blood pressure. 12/03/21  Yes Sande Rives E, PA-C  multivitamin (RENA-VIT) TABS tablet Take 1 tablet by mouth daily. 08/22/21  Yes [name], MD  nitroGLYCERIN (NITROSTAT) 0.4 MG SL tablet Place 1 tablet (0.4 mg total) under the tongue every 5 (five) minutes x 3 doses as needed for chest pain. 11/13/21  Yes Margie Billet, PA-C  oxymetazoline (AFRIN) 0.05 % nasal spray Place 1 spray into both nostrils 2 (two) times daily as needed for congestion.   Yes [provider]  pantoprazole (PROTONIX) 40 MG tablet Take 1 tablet (40 mg total) by mouth daily. 11/14/21  Yes Margie Billet, PA-C  saccharomyces boulardii (FLORASTOR) 250 MG capsule Take 1 capsule (250 mg total) by mouth 2 (two) times daily. 04/08/19  Yes Regalado, Belkys A, MD  sevelamer carbonate (RENVELA) 800 MG tablet Take 2 tablets (1,600 mg total) by mouth 3 (three) times daily with meals. 11/13/21  Yes Lelon Perla, MD  silver sulfADIAZINE (SILVADENE) 1 % cream Apply pea-sized amount to wound daily. 10/01/20  Yes Evelina Bucy, DPM  triamcinolone cream (KENALOG) 0.1 % Apply 1 application topically as directed. 04/10/19  Yes [provider]  vitamin B-12 (CYANOCOBALAMIN) 1000 MCG tablet Take  1,000 mcg by mouth daily.   Yes [provider]  glucose blood (FREESTYLE TEST  STRIPS) test strip Use to check blood sugar daily 05/01/19   [name], MD  glucose monitoring kit (FREESTYLE) monitoring kit USE TO MONITOR BLOOD GLUCOSE AS DIRECTED 05/01/19   [name], MD  lidocaine (XYLOCAINE) 5 % ointment Apply 1 application topically as needed. Patient not taking: Reported on 12/23/2021 02/06/21   Narda Amber K, DO  Methoxy PEG-Epoetin Beta (MIRCERA IJ) as directed. 02/26/21 07/15/22  [provider]  mirtazapine (REMERON) 7.5 MG tablet Take 0.5-1 tablets (3.75-7.5 mg total) by mouth at bedtime. Patient not taking: Reported on 12/23/2021 05/30/19   [name], MD  sertraline (ZOLOFT) 100 MG tablet TAKE 1 & 1/2 (ONE & ONE-HALF) TABLETS BY MOUTH ONCE DAILY Patient not taking: Reported on 12/23/2021 11/21/21   [name], MD    I have reviewed the patient's current and reported prior to admission medications.  Labs:     Latest Ref Rng & Units 12/23/2021    1:20 PM 11/13/2021   12:49 AM 11/12/2021    5:54 AM  BMP  Glucose 70 - 99 mg/dL 226  91  168   BUN 8 - 23 mg/dL 41  19  11   Creatinine 0.61 - 1.24 mg/dL 7.95  6.22  4.41   Sodium 135 - 145 mmol/L 137  135  136   Potassium 3.5 - 5.1 mmol/L 6.2  4.8  4.7   Chloride 98 - 111 mmol/L 93  94  97   CO2 22 - 32 mmol/L '27  30  28   ' Calcium 8.9 - 10.3 mg/dL 9.9  8.6  9.0     ROS:  Pertinent items noted in HPI and remainder of comprehensive ROS otherwise negative.  Physical Exam: Vitals:   12/23/21 1515 12/23/21 1530  BP: (!) 120/50 (!) 108/52  Pulse: 65 65  Resp: 18 19  Temp:    SpO2: 94% 94%     General: elderly male in stretcher in NAD at rest  HEENT: NCAT Eyes: EOMI sclera anicteric Neck: supple trachea midline  Heart: S1S2 no rub Lungs: clear to auscultation; normal work of breathing at rest Abdomen: softly distended/obese/nontender Extremities: no edema appreciated; no cyanosis or  clubbing  Skin: no rash on extremities exposed  Neuro: alert and oriented x3 provides hx and follows commands Psych normal mood and affect Access RUE AVF with bruit and thrill   Outpatient HD orders:  [place] 4 hours BF 400, DF 500  2 K/2 Ca bath EDW 95.5 kg AVF Meds: mircera 75 mcg every 4 weeks (last dose on 12/10/21), venofer 50 mg weekly, calcitriol 1.75 mcg three times a week with HD  Last post weight - 95.7 kg on 12/22/21. Earlier this month was above EDW (98.9 kg post weight on 6/5) but has come down over time and met or approached EDW recently  Assessment/Plan:  # Chest pain secondary to unstable angina - If hyperkalemia improved with medical measures hope to avoid HD tonight - Per cardiology note plan is for heart cath tomorrow   # Hyperkalemia - Stat K (draw 20 minutes after insulin/glucose). Spoke with RN - s/p lokelma - ordered bicarb x 1 amp - ordered insulin/glucose and spoke with RN - if K has improved with medical measures hope to be able to push off HD until after the cath in the setting of unstable angina   # ESRD  - Usually does HD per MWF schedule  - Hope to temporize this evening then plan for HD tomorrow after  heart cath (given unstable angina).  Await repeat labs to guide   # Metabolic bone disease  - Please remove calcitriol from his outpatient medication list (this is given at HD) to avoid duplication in therapy  # Anemia of CKD - Mild and setting of unstable angina; no ESA   Claudia Desanctis 12/23/2021, 7:56 PM

## 2021-12-23 NOTE — ED Provider Triage Note (Signed)
Emergency Medicine Provider Triage Evaluation Note  Rodney Farley , a 88 y.o. male  was evaluated in triage.  Patient had a STEMI in March and had 2 stents placed.  He had a history of ACS and stents placed prior to this as well.  This morning he started to have chest pain while he was resting at 830 this morning.  Says that it radiated to his left arm, through his shoulder and between his shoulder blades on his back.  He reports trying to lay down and it would not go away.  No associated palpitations, difficulty breathing or dizziness.  Said that he no longer has pain after nitro and aspirin.  EMS reports 1 episode of emesis in route.  He received Zofran.   Review of Systems  Positive: Chest pain, resolved after nitro and aspirin Negative: Shortness of breath, palpitations or any other symptoms at this moment.  Physical Exam  BP (!) 158/63 (BP Location: Left Arm)   Pulse 78   Temp 98.4 F (36.9 C) (Oral)   Resp 18   SpO2 98%  Gen:   Awake, no distress   Resp:  Normal effort  MSK:   Moves extremities without difficulty  Other:  RRR, resting comfortably, not diaphoretic, lung sounds clear  Medical Decision Making  Medically screening exam initiated at 11:14 AM.  Appropriate orders placed.  Jru Pense was informed that the remainder of the evaluation will be completed by another provider, this initial triage assessment does not replace that evaluation, and the importance of remaining in the ED until their evaluation is complete.    Low suspicion dissection however high suspicion ischemia/infarction.   Rhae Hammock, PA-C 12/23/21 1123

## 2021-12-23 NOTE — H&P (Signed)
History and Physical    Patient: Rodney Farley TWS:[id number] DOB: [birth year] DOA: 12/23/2021 DOS: the patient was seen and examined on 12/23/2021 PCP: [name], MD  Patient coming from: Home  Chief Complaint:  Chief Complaint  Patient presents with   Chest Pain   HPI: Rodney Farley is a 88 y.o. male with medical history significant of CAD status post multiple PCI with stent placement's, NSTEMI, complete heart block status post PPM, ischemic cardiomyopathy status post AICD, moderate aortic stenosis, hypertension, hyperlipidemia, diabetes mellitus type 2, OSA, COPD, TIA, BPH. Patient described total torso pain and pressure with radiation down left arm that started around 7:30 AM. Chest pain was non-exertional. Initially patient thought he had acid reflux, but then pain significantly worsened. Patient took double his gabapentin dose and 3 aspirin 81 mg tablets. Patient called his daughter-in-law and she called EMS. When EMS arrived, he was given nitroglycerin but soon vomited; no relief. He has associated dyspnea.  Review of Systems: As mentioned in the history of present illness. All other systems reviewed and are negative. Past Medical History:  Diagnosis Date   AICD (automatic cardioverter/defibrillator) present    Anemia    Anxiety    Arthritis    Atrioventricular block, complete ([place])    a. 2010 s/p pacemaker.   Bell's palsy    left side. after shingles episode   Bilateral renal cysts 07/23/2017   Simple and hemorrhagic noted on CT ab/pelvis    BPH associated with nocturia    Chronic systolic CHF (congestive heart failure) ([place])    EF normalized by Echo 2019   COPD (chronic obstructive pulmonary disease) ([place])    Severe   Coronary artery disease    a. s/p MI in 1994/1995 while in [place] s/p questionable PCI. 03/2015: progression of disease, for staged PCI.   Depression    Diabetic peripheral neuropathy (HCC)    Diverticulitis    Full dentures     Gallstones    GERD (gastroesophageal reflux disease)    Gout    Hard of hearing    B/L   History of chronic pancreatitis 07/23/2017   noted on CT abd/pelvis   History of shingles    Hypercholesterolemia    Hypertension    Ischemic cardiomyopathy    MI (myocardial infarction) ([place]) 1994; 1995   Neuropathy    IN LOWER EXTREMITIES   Nosebleed 10/06/2017   for 2 months most recent 10/06/2017   Obesity    Pacemaker    medtronic>>> MDT ICD 09/23/15   Ruptured appendicitis    Sleep apnea    "sleeps w/humidifyer when he panics and gets short of breath" (04/08/2015)   TIA (transient ischemic attack) X 3   Trigger middle finger of left hand    Type II diabetes mellitus ([place])    Wears glasses    Wears hearing aid    Past Surgical History:  Procedure Laterality Date   APPENDECTOMY     AV FISTULA PLACEMENT Right 02/07/2019   Procedure: ARTERIOVENOUS (AV) FISTULA CREATION RIGHT UPPER ARM;  Surgeon: Waynetta Sandy, MD;  Location: [facility];  Service: Vascular;  Laterality: Right;   BIV ICD GENERATOR CHANGEOUT N/A 10/09/2021   Procedure: BIV ICD GENERATOR CHANGEOUT;  Surgeon: Deboraha Sprang, MD;  Location: [facility];  Service: Cardiovascular;  Laterality: N/A;   CARDIAC CATHETERIZATION N/A 03/29/2015   Procedure: Right/Left Heart Cath and Coronary Angiography;  Surgeon: Tecumseh M [name], MD;  Location: [facility];  Service: Cardiovascular;  Laterality: N/A;   [place]   "after my MI; put me on heart RX after cath"   CARDIAC CATHETERIZATION N/A 04/09/2015   Procedure: Coronary Stent Intervention;  Surgeon: Kweli M [name], MD;  Location: [facility];  Service: Cardiovascular;  Laterality: N/A;   CATARACT EXTRACTION W/ INTRAOCULAR LENS  IMPLANT, BILATERAL     CHOLECYSTECTOMY N/A 10/13/2017   Procedure: LAPAROSCOPIC CHOLECYSTECTOMY WITH LYSIS OF ADHESIONS;  Surgeon: Ileana Roup, MD;  Location: [facility];  Service: General;  Laterality: N/A;    COLONOSCOPY     CORONARY ANGIOGRAPHY N/A 11/12/2021   Procedure: CORONARY ANGIOGRAPHY;  Surgeon: Wellington Hampshire, MD;  Location: [facility];  Service: Cardiovascular;  Laterality: N/A;   CORONARY STENT INTERVENTION N/A 11/12/2021   Procedure: CORONARY STENT INTERVENTION;  Surgeon: Wellington Hampshire, MD;  Location: [facility];  Service: Cardiovascular;  Laterality: N/A;  [facility]   DENTAL SURGERY     EP IMPLANTABLE DEVICE N/A 09/23/2015   MDT CRT-D, Dr. Caryl Comes   HIATAL HERNIA REPAIR  1977   ILEOCECETOMY N/A 03/27/2017   Procedure: ILEOCECECTOMY;  Surgeon: Ileana Roup, MD;  Location: [facility];  Service: General;  Laterality: N/A;   INSERT / REPLACE / REMOVE PACEMAKER  07/2008   Complete heart block status post DDD with good function   IR FLUORO GUIDE CV LINE RIGHT  04/03/2019   IR US GUIDE VASC ACCESS RIGHT  04/03/2019   LAPAROTOMY N/A 03/27/2017   Procedure: EXPLORATORY LAPAROTOMY;  Surgeon: Ileana Roup, MD;  Location: [facility];  Service: General;  Laterality: N/A;   TONSILLECTOMY     UPPER GI ENDOSCOPY     Social History:  reports that he quit smoking about 14 years ago. His smoking use included cigarettes. He has a 81.00 pack-year smoking history. He has never been exposed to tobacco smoke. He has never used smokeless tobacco. He reports current alcohol use. He reports that he does not use drugs.  Allergies  Allergen Reactions   Bee Venom Anaphylaxis   Lyrica [Pregabalin] Other (See Comments)    hallucinations   Prednisone Other (See Comments)    hallucinations   Zocor [Simvastatin] Nausea Only and Other (See Comments)    Headache with brand name only.  Can take the generic.    Family History  Problem Relation Age of Onset   Stroke Mother    Leukemia Father    Stroke Sister    Heart attack Brother     Prior to Admission medications   Medication Sig Start Date End Date Taking? Authorizing Provider  aspirin EC 81 MG tablet Take 81 mg by mouth daily.     [provider]  atorvastatin (LIPITOR) 80 MG tablet Take 1 tablet (80 mg total) by mouth daily. 11/14/21   Margie Billet, PA-C  calcitRIOL (ROCALTROL) 0.25 MCG capsule Take 7 capsules (1.75 mcg total) by mouth every Monday, Wednesday, and Friday with hemodialysis. 11/14/21   Margie Billet, PA-C  carbamide peroxide (DEBROX) 6.5 % OTIC solution Place 5 drops into both ears 2 (two) times daily as needed (earwax). 11/13/21   Lelon Perla, MD  Carboxymethylcellul-Glycerin (LUBRICATING EYE DROPS OP) Place 1 drop into both eyes 3 (three) times daily as needed (dry eyes).    [provider]  carvedilol (COREG) 3.125 MG tablet Take twice daily. Hold the evening before and the morning of dialysis. 12/03/21   Sande Rives E, PA-C  clopidogrel (  PLAVIX) 75 MG tablet Take 1 tablet (75 mg total) by mouth daily with breakfast. 11/14/21   Margie Billet, PA-C  erythromycin ophthalmic ointment Place 1 application into the left eye at bedtime.    [provider]  gabapentin (NEURONTIN) 300 MG capsule Take 1 capsule (300 mg total) by mouth 2 (two) times daily. 11/13/21   Lelon Perla, MD  glucose blood (FREESTYLE TEST STRIPS) test strip Use to check blood sugar daily 05/01/19   [name], MD  glucose monitoring kit (FREESTYLE) monitoring kit USE TO MONITOR BLOOD GLUCOSE AS DIRECTED 05/01/19   [name], MD  lidocaine (XYLOCAINE) 5 % ointment Apply 1 application topically as needed. 02/06/21   Narda Amber K, DO  Methoxy PEG-Epoetin Beta (MIRCERA IJ) as directed. 02/26/21 07/15/22  [provider]  midodrine (PROAMATINE) 10 MG tablet Take 1 tablet (10 mg) three times a week. Add 5 mg a night after dialysis as needed for low blood pressure. 12/03/21   Sande Rives E, PA-C  mirtazapine (REMERON) 7.5 MG tablet Take 0.5-1 tablets (3.75-7.5 mg total) by mouth at bedtime. 05/30/19   [name], MD  multivitamin (RENA-VIT) TABS tablet Take 1 tablet  by mouth daily. 08/22/21   [name], MD  nitroGLYCERIN (NITROSTAT) 0.4 MG SL tablet Place 1 tablet (0.4 mg total) under the tongue every 5 (five) minutes x 3 doses as needed for chest pain. 11/13/21   Margie Billet, PA-C  oxymetazoline (AFRIN) 0.05 % nasal spray Place 1 spray into both nostrils 2 (two) times daily as needed for congestion.    [provider]  pantoprazole (PROTONIX) 40 MG tablet Take 1 tablet (40 mg total) by mouth daily. 11/14/21   Margie Billet, PA-C  saccharomyces boulardii (FLORASTOR) 250 MG capsule Take 1 capsule (250 mg total) by mouth 2 (two) times daily. 04/08/19   Regalado, Belkys A, MD  sertraline (ZOLOFT) 100 MG tablet TAKE 1 & 1/2 (ONE & ONE-HALF) TABLETS BY MOUTH ONCE DAILY 11/21/21   [name], MD  sevelamer carbonate (RENVELA) 800 MG tablet Take 2 tablets (1,600 mg total) by mouth 3 (three) times daily with meals. 11/13/21   Lelon Perla, MD  silver sulfADIAZINE (SILVADENE) 1 % cream Apply pea-sized amount to wound daily. 10/01/20   Evelina Bucy, DPM  triamcinolone cream (KENALOG) 0.1 % Apply 1 application topically as directed. 04/10/19   [provider]  vitamin B-12 (CYANOCOBALAMIN) 1000 MCG tablet Take 1,000 mcg by mouth daily.    [provider]    Physical Exam: Vitals:   12/23/21 1500 12/23/21 1505 12/23/21 1515 12/23/21 1530  BP:  114/74 (!) 120/50 (!) 108/52  Pulse: 68 70 65 65  Resp: '17 13 18 19  ' Temp:      TempSrc:      SpO2: 99% 97% 94% 94%   General exam: Appears calm and comfortable and in no acute distress. Conversant Respiratory: Clear to auscultation. Respiratory effort normal with no intercostal retractions or use of accessory muscles Cardiovascular: S1 & S2 heard, RRR. 2/6 systolic murmur. No LE edema Gastrointestinal: Abdomen is non-distended, soft and non-tender. No masses felt. Normal bowel sounds heard Neurologic: No focal neurological deficits Musculoskeletal: No calf  tenderness Skin: No cyanosis. No new rashes Psychiatry: Alert and oriented x4. Memory intact. Mood & affect appropriate  Data Reviewed: BMP of 6.2, BUN of 41, creatinine of 7.95, glucose of 226, troponin if 223>653  Assessment and Plan:  Chest pain  CAD Recent left heart catheterization on 11/11/2021, s/p angioplasty and DES placement to ostial/proximal LAD with recommendations for DAPT x12 months. Troponin of 223 > 653 this admission. Cardiology consulted for admission and recommended medicine admission. Cardiology recommending Heparin IV and plan cardiac catheterization in AM. -Continue aspirin and Plavix -Telemetry floor -Cardiology recommendations: Heparin IV, Aspirin and Plavix. Cath in AM  Hyperkalemia Potassium of 6.2 on admission.  No obvious EKG changes.  Chest pain as mentioned above.  Calcium gluconate ordered and pending from the ED. -Continue calcium gluconate -Lokelma 10 g x 1 -Repeat potassium  ESRD on HD Patient receives dialysis on Monday Wednesday and Friday.  Last dialysis session was on Monday.  Nephrology consulted and will see patient for continued dialysis while inpatient.  Moderate aortic stenosis Noted.  Hypotension During hemodialysis -Continue midodrine three times per week for hemodialysis  Secondary hypoparathyroidism -Continue calcitriol  Chronic combined systolic and diastolic heart failure Mildly reduced EF of 45-50% with grade 1 diastolic dysfunction. -Continue Coreg; hold the evening before and the morning of hemodialysis  GERD -Continue Protonix  Depression Insomnia Patient is no longer taking Remeron and Zoloft  Hyperlipidemia -Continue Lipitor  Diabetes mellitus, type 2 Last hemoglobin A1C of 7.5%. Not on medication management. -Carb modified diet -SSI  Chronic anemia Likely related to chronic kidney disease. Stable.  Thrombocytopenia Stable since one month prior. Unsure of etiology. Currently stable. No evidence  bleeding.    Advance Care Planning: DNR  Consults: Cardiology, Nephrology  Family Communication: None   Author: Cordelia Poche, MD 12/23/2021 4:14 PM  For on call review [email or web address].

## 2021-12-23 NOTE — ED Triage Notes (Signed)
Pt arrives via EMS for CP that started this morning radiating to bilateral arms. Pt was nauseous and vomited.  EMS gave 1 nitro and pain went away. 4mg  zofran and 324mg  asa given by EMS. BP 129/54, pacemaker HR 70, CBG 211

## 2021-12-23 NOTE — ED Provider Notes (Signed)
[facility] EMERGENCY DEPARTMENT Provider Note   CSN: [id number] Arrival date & time: 12/23/21  1106     History  Chief Complaint  Patient presents with   Chest Pain    Rodney Farley is a 88 y.o. male with a past medical history of type 2 diabetes, ESRD on dialysis MWF, COPD, MI 1994, heart failure, A-fib, pacemaker status and NSTEMI last month presenting with acute onset chest pain that started at 830 this morning.  He reports that he was not exerting himself when all of a sudden he felt severe pain just over her sternum that radiated into his left arm and through both shoulder blades.  Says it felt like when he had his NSTEMI in May.  He immediately laid down and tried to find a comfortable position but the pain would not go away.  EMS arrived and gave him nitro and aspirin which improved his symptoms however they reported that he did have 1 episode of emesis.  On arrival he says that he feels baseline.   Chest Pain Associated symptoms: nausea and vomiting   Associated symptoms: no palpitations and no shortness of breath        Home Medications Prior to Admission medications   Medication Sig Start Date End Date Taking? Authorizing Provider  aspirin EC 81 MG tablet Take 81 mg by mouth daily.    [provider]  atorvastatin (LIPITOR) 80 MG tablet Take 1 tablet (80 mg total) by mouth daily. 11/14/21   Margie Billet, PA-C  calcitRIOL (ROCALTROL) 0.25 MCG capsule Take 7 capsules (1.75 mcg total) by mouth every Monday, Wednesday, and Friday with hemodialysis. 11/14/21   Margie Billet, PA-C  carbamide peroxide (DEBROX) 6.5 % OTIC solution Place 5 drops into both ears 2 (two) times daily as needed (earwax). 11/13/21   Lelon Perla, MD  Carboxymethylcellul-Glycerin (LUBRICATING EYE DROPS OP) Place 1 drop into both eyes 3 (three) times daily as needed (dry eyes).    [provider]  carvedilol (COREG) 3.125 MG tablet Take twice daily.  Hold the evening before and the morning of dialysis. 12/03/21   Darreld Mclean, PA-C  clopidogrel (PLAVIX) 75 MG tablet Take 1 tablet (75 mg total) by mouth daily with breakfast. 11/14/21   Margie Billet, PA-C  erythromycin ophthalmic ointment Place 1 application into the left eye at bedtime.    [provider]  gabapentin (NEURONTIN) 300 MG capsule Take 1 capsule (300 mg total) by mouth 2 (two) times daily. 11/13/21   Lelon Perla, MD  glucose blood (FREESTYLE TEST STRIPS) test strip Use to check blood sugar daily 05/01/19   [name], MD  glucose monitoring kit (FREESTYLE) monitoring kit USE TO MONITOR BLOOD GLUCOSE AS DIRECTED 05/01/19   [name], MD  lidocaine (XYLOCAINE) 5 % ointment Apply 1 application topically as needed. 02/06/21   Narda Amber K, DO  Methoxy PEG-Epoetin Beta (MIRCERA IJ) as directed. 02/26/21 07/15/22  [provider]  midodrine (PROAMATINE) 10 MG tablet Take 1 tablet (10 mg) three times a week. Add 5 mg a night after dialysis as needed for low blood pressure. 12/03/21   Sande Rives E, PA-C  mirtazapine (REMERON) 7.5 MG tablet Take 0.5-1 tablets (3.75-7.5 mg total) by mouth at bedtime. 05/30/19   [name], MD  multivitamin (RENA-VIT) TABS tablet Take 1 tablet by mouth daily. 08/22/21   [name], MD  nitroGLYCERIN (NITROSTAT) 0.4 MG SL tablet Place 1  tablet (0.4 mg total) under the tongue every 5 (five) minutes x 3 doses as needed for chest pain. 11/13/21   Margie Billet, PA-C  oxymetazoline (AFRIN) 0.05 % nasal spray Place 1 spray into both nostrils 2 (two) times daily as needed for congestion.    [provider]  pantoprazole (PROTONIX) 40 MG tablet Take 1 tablet (40 mg total) by mouth daily. 11/14/21   Margie Billet, PA-C  saccharomyces boulardii (FLORASTOR) 250 MG capsule Take 1 capsule (250 mg total) by mouth 2 (two) times daily. 04/08/19   Regalado, Belkys A, MD  sertraline (ZOLOFT) 100 MG  tablet TAKE 1 & 1/2 (ONE & ONE-HALF) TABLETS BY MOUTH ONCE DAILY 11/21/21   [name], MD  sevelamer carbonate (RENVELA) 800 MG tablet Take 2 tablets (1,600 mg total) by mouth 3 (three) times daily with meals. 11/13/21   Lelon Perla, MD  silver sulfADIAZINE (SILVADENE) 1 % cream Apply pea-sized amount to wound daily. 10/01/20   Evelina Bucy, DPM  triamcinolone cream (KENALOG) 0.1 % Apply 1 application topically as directed. 04/10/19   [provider]  vitamin B-12 (CYANOCOBALAMIN) 1000 MCG tablet Take 1,000 mcg by mouth daily.    [provider]      Allergies    Bee venom, Lyrica [pregabalin], Prednisone, and Zocor [simvastatin]    Review of Systems   Review of Systems  Respiratory:  Negative for chest tightness and shortness of breath.   Cardiovascular:  Positive for chest pain. Negative for palpitations.  Gastrointestinal:  Positive for nausea and vomiting.    Physical Exam Updated Vital Signs BP 135/60   Pulse 72   Temp 98.4 F (36.9 C) (Oral)   Resp 16   SpO2 99%  Physical Exam Vitals and nursing note reviewed.  Constitutional:      General: He is not in acute distress.    Appearance: Normal appearance. He is not ill-appearing.  HENT:     Head: Normocephalic and atraumatic.  Eyes:     General: No scleral icterus.    Conjunctiva/sclera: Conjunctivae normal.  Cardiovascular:     Rate and Rhythm: Normal rate and regular rhythm.     Heart sounds: Normal heart sounds.  Pulmonary:     Effort: Pulmonary effort is normal. No respiratory distress.     Breath sounds: Normal breath sounds.  Chest:     Chest wall: No tenderness.  Abdominal:     Palpations: Abdomen is soft.     Tenderness: There is no abdominal tenderness.  Skin:    General: Skin is warm and dry.     Findings: No rash.  Neurological:     Mental Status: He is alert.  Psychiatric:        Mood and Affect: Mood normal.     ED Results / Procedures / Treatments   Labs (all  labs ordered are listed, but only abnormal results are displayed) Labs Reviewed  CBC - Abnormal; Notable for the following components:      Result Value   RBC 3.52 (*)    Hemoglobin 12.5 (*)    HCT 38.1 (*)    MCV 108.2 (*)    MCH 35.5 (*)    RDW 15.8 (*)    Platelets 108 (*)    All other components within normal limits  BASIC METABOLIC PANEL - Abnormal; Notable for the following components:   Potassium 6.2 (*)    Chloride 93 (*)    Glucose, Bld 226 (*)  BUN 41 (*)    Creatinine, Ser 7.95 (*)    GFR, Estimated 6 (*)    Anion gap 17 (*)    All other components within normal limits  TROPONIN I (HIGH SENSITIVITY) - Abnormal; Notable for the following components:   Troponin I (High Sensitivity) 223 (*)    All other components within normal limits  TROPONIN I (HIGH SENSITIVITY)    EKG None  Radiology DG Chest 2 View  Result Date: 12/23/2021 CLINICAL DATA:  Chest pain beginning this morning. Nausea and vomiting. EXAM: CHEST - 2 VIEW COMPARISON:  11/11/2021 FINDINGS: Heart size remains at the upper limits of normal. Pacemaker again seen in expected position. Both lungs are clear. IMPRESSION: Stable exam. No active lung disease. Electronically Signed   By: Marlaine Hind M.D.   On: 12/23/2021 11:51    Procedures .Critical Care  Performed by: Rhae Hammock, PA-C Authorized by: Rhae Hammock, PA-C   Critical care provider statement:    Critical care time (minutes):  30   Critical care time was exclusive of:  Separately billable procedures and treating other patients   Critical care was necessary to treat or prevent imminent or life-threatening deterioration of the following conditions:  Cardiac failure and renal failure   Critical care was time spent personally by me on the following activities:  Development of treatment plan with patient or surrogate, discussions with consultants, evaluation of patient's response to treatment, examination of patient, ordering and review  of laboratory studies, ordering and review of radiographic studies, ordering and performing treatments and interventions, pulse oximetry, re-evaluation of patient's condition and review of old charts   I assumed direction of critical care for this patient from another provider in my specialty: no     Care discussed with: admitting provider      Medications Ordered in ED Medications  calcium gluconate 1 g/ 50 mL sodium chloride IVPB (has no administration in time range)    ED Course/ Medical Decision Making/ A&P Clinical Course as of 12/23/21 1439  Tue Dec 23, 2021  1329 Called lab to rush the redrawn troponin [MR]  1336 Lab reports they have received the troponin and they have again been encouraged to run this prior to other labs [MR]  [place] 1 final time and they notified me that the patient's troponin is 223. [MR]    Clinical Course User Index [MR] Cyndal Kasson, Cecilio Asper, PA-C                           Medical Decision Making Amount and/or Complexity of Data Reviewed Labs: ordered. Radiology: ordered.  Risk Prescription drug management. Decision regarding hospitalization.   This patient presents to the ED for concern of chest pain that felt like his previous NSTEMI. The emergent differential diagnosis of chest pain includes: Acute coronary syndrome, pericarditis, aortic dissection, pulmonary embolism, tension pneumothorax, and esophageal rupture.    This is not an exhaustive differential.    Past Medical History / Co-morbidities / Social History: NSTEMI last month, MI 1994, CAD, CHF, A-fib, hypertension and hyperlipidemia   Additional history:  I reviewed patient's notes with cardiology.  He follows closely with Dr. Stanford Breed.   on 5/3 patient had a left heart catheterization.  This showed 90% stenosis of the ostial/proximal LAD which probably was responsible for his NSTEMI.  This area was successfully stented with DES.  At that time he also had 70% stenosis of the  ostial  right coronary artery.  He was started on dual antiplatelet therapy which he reports still being on.   Physical Exam: Well-appearing.  Lab Tests: I ordered, and personally interpreted labs.  The pertinent results include:  -Stable macrocytic anemia -Hyperkalemia 6.2 (note, lab has had multiple hemolyzed samples from this patient this morning vs ESRD) -Creatinine 7.95, slightly higher than baseline the day before his dialysis.  Says that he has not missed any sessions but occasionally gets half session secondary to muscle cramps.     Imaging Studies: I ordered and independently visualized and interpreted imaging which showed no findings. I agree with the radiologist interpretation.   Cardiac Monitoring:  The patient was maintained on a cardiac monitor.  My attending physician Dr. Matilde Sprang viewed and interpreted the cardiac monitored which showed an underlying rhythm of: ventricular based rhythm with a normal rate.   Medications: I ordered medication including insulin and calcium for hyperkalemia. .   Consultations Obtained: I discussed lab and imaging findings with cardiology.  Dr. Stanford Breed who requests medicine admission and they will come and see the patient.   Disposition: Requires admission for further cardiac work-up.  I discussed this case with my attending physician Dr. Matilde Sprang who saw the patient, cosigned this note including patient's presenting symptoms, physical exam, and planned diagnostics and interventions. Attending physician stated agreement with plan or made changes to plan which were implemented.      Final Clinical Impression(s) / ED Diagnoses Final diagnoses:  Chest pain due to myocardial ischemia, unspecified ischemic chest pain type    Rx / DC Orders Admit to Dr. Lonny Prude.  He was notified of patient's multiple hemolyzed samples and the potential effect on the hyperkalemia.  Patient is asymptomatic, resting comfortably, stable vital signs and agreeable to  admission.  Cardiology has already consulted.    Darliss Ridgel 12/23/21 [place], [place], MD 12/24/21 [phone]

## 2021-12-23 NOTE — ED Notes (Signed)
The pt is not 0/10 chest pain after 1 SL nitro

## 2021-12-23 NOTE — ED Notes (Addendum)
Approx. 10 min ago, PT began complaining of 3/10 epigastric pain radiating to his left shoulder and arm. Described it as 'feeling like he was having a heart attack again". Dr. Rudi Rummage of [facility] notified.  Advised ECG and SL nitro q5 x3 then drip if that does not resolve pain.  ECG already performed and seen by Dr. Rudi Rummage.  I will continue to update him.

## 2021-12-23 NOTE — Consult Note (Addendum)
Cardiology Consultation:   Patient ID: Rodney Farley MRN: [id number]; DOB: 04/25/1932  Admit date: 12/23/2021 Date of Consult: 12/23/2021  PCP:  [name], MD   [facility] Providers Cardiologist:  Hurshell [name], MD      Patient Profile:   Rodney Farley is a 88 y.o. male with a hx of ESRD on HD, CAD with MI in 1994/1995 while in [place], [place] to [place] in 2016, recent NSTEMI in 11/2021, CHB with PPM implanted in 2010 (underwent CRT-D upgrade 3/17, generator replaced 09/2021), ischemic cardiomyopathy/chronic systolic heart failure,moderate aortic stenosis, HTN, HLD, type 2 DM, OSA, COPD, TIA, BPH who is being seen 12/23/2021 for the evaluation of chest pain at the request of Dr. Matilde Sprang.  History of Present Illness:   Rodney Farley is an 88 year old male with above medical history who is followed by Dr. [name]. Per chart review, patient has a long history of CAD. Reportedly had MI in 1994/1995 while living in [place]. More recently, had a R/L heart cath on 03/29/2015 that showed severe 3 vessel obstructive CAD. Considered treatment with CABG, but patient decided to pursue PCI as he was at an increased surgical risk due to age and poor renal function. Underwent Coronary Stent Intervention on 04/09/2015 with PCI to the mid LAD, second diagonal, and OM3.   Patient also has a history of completed Heart Block. Had his first pacemaker implanted in 2010. Later underwent CRT-D upgrade in 09/2015 and the generator was replaced in 09/2021.  Patient also has a history of ischemic heart disease, with EF as low as 25-30% in 2016. Most recent echocardiogram showed an improvement of EF to 45-50%.   Patient was recently admitted to the hospital in 11/2021 for evaluation of chest pain. Patient presented to the ED on 11/11/2021 complaining of sharp, substernal chest pain that radiated to the left upper extremity with associated nausea, diaphoresis, and dyspnea. hsTn peaked at 4677.  Echocardiogram on 11/11/21 showed EF 45-50%, regional wall motion abnormalities, mild LVH and grade I diastolic dysfunction, normal RV systolic function, moderately dilated LA, moderate aortic stenosis. Underwent LHC on 11/12/21 that showed  90% stenosis of ostial to proximal LAD, 90% stenosis of ostial 1st Diag, and 70% stenosis of ostial to proximal RCA, and then mild disease in OM3, 2nd Diag, and LPAV. He underwent successful PCI with DES to ostial to proximal LAD lesion. He was started on DAPT with Aspirin and Plavix and home Lipitor was increased.  Patient was last seen by cardiology on 12/03/21. At that appointment, patient was doing well. Reported that his BP was sometimes low on the evening after dialysis, even while on Midodrine 10 mg on dialysis days and holding carvedilol the evening before/morning of dialysis.   Patient presented to the ED on 6/13 complaining of chest pain that started earlier in the morning, radiated to bilateral arms. Associated with Nausea, vomiting. Patient is on HD MWF. Labs in the ED showed Na 137, K 6.2, creatinine 7.95, WBC 7.5, hemoglobin 12.5, platelets 108. hsTn elevated to 223. CXR showed no active lung disease, Ppm in expected position. EKG showed an AV dual paced rhythm.    On interview, patient reported that he was resting this AM around 0830 when he noticed a tightness in his upper abdomen that spread up his chest and down his left arm. Associated with sob and nausea, patient vomited once. Then, noticed that pain was persisting and worsening so he called EMS. He was given 1 nitro by EMS  and pain went away. Believes that pain lasted for about 2-2.5 hours. Currently, patient is chest pain free. Reports that pain was extremely similar to the symptoms that he felt with NSTEMI in 11/2021.   Past Medical History:  Diagnosis Date   AICD (automatic cardioverter/defibrillator) present    Anemia    Anxiety    Arthritis    Atrioventricular block, complete ([place])    a. 2010  s/p pacemaker.   Bell's palsy    left side. after shingles episode   Bilateral renal cysts 07/23/2017   Simple and hemorrhagic noted on CT ab/pelvis    BPH associated with nocturia    Chronic systolic CHF (congestive heart failure) ([place])    EF normalized by Echo 2019   COPD (chronic obstructive pulmonary disease) ([place])    Severe   Coronary artery disease    a. s/p MI in 1994/1995 while in [place] s/p questionable PCI. 03/2015: progression of disease, for staged PCI.   Depression    Diabetic peripheral neuropathy (HCC)    Diverticulitis    Full dentures    Gallstones    GERD (gastroesophageal reflux disease)    Gout    Hard of hearing    B/L   History of chronic pancreatitis 07/23/2017   noted on CT abd/pelvis   History of shingles    Hypercholesterolemia    Hypertension    Ischemic cardiomyopathy    MI (myocardial infarction) ([place]) 1994; 1995   Neuropathy    IN LOWER EXTREMITIES   Nosebleed 10/06/2017   for 2 months most recent 10/06/2017   Obesity    Pacemaker    medtronic>>> MDT ICD 09/23/15   Ruptured appendicitis    Sleep apnea    "sleeps w/humidifyer when he panics and gets short of breath" (04/08/2015)   TIA (transient ischemic attack) X 3   Trigger middle finger of left hand    Type II diabetes mellitus ([place])    Wears glasses    Wears hearing aid     Past Surgical History:  Procedure Laterality Date   APPENDECTOMY     AV FISTULA PLACEMENT Right 02/07/2019   Procedure: ARTERIOVENOUS (AV) FISTULA CREATION RIGHT UPPER ARM;  Surgeon: Waynetta Sandy, MD;  Location: [facility];  Service: Vascular;  Laterality: Right;   BIV ICD GENERATOR CHANGEOUT N/A 10/09/2021   Procedure: BIV ICD GENERATOR CHANGEOUT;  Surgeon: Deboraha Sprang, MD;  Location: [facility];  Service: Cardiovascular;  Laterality: N/A;   CARDIAC CATHETERIZATION N/A 03/29/2015   Procedure: Right/Left Heart Cath and Coronary Angiography;  Surgeon: Yovanni M [name], MD;  Location: [facility];  Service: Cardiovascular;  Laterality: N/A;   [place]   "after my MI; put me on heart RX after cath"   CARDIAC CATHETERIZATION N/A 04/09/2015   Procedure: Coronary Stent Intervention;  Surgeon: Jeral M [name], MD;  Location: [facility];  Service: Cardiovascular;  Laterality: N/A;   CATARACT EXTRACTION W/ INTRAOCULAR LENS  IMPLANT, BILATERAL     CHOLECYSTECTOMY N/A 10/13/2017   Procedure: LAPAROSCOPIC CHOLECYSTECTOMY WITH LYSIS OF ADHESIONS;  Surgeon: Ileana Roup, MD;  Location: [facility];  Service: General;  Laterality: N/A;   COLONOSCOPY     CORONARY ANGIOGRAPHY N/A 11/12/2021   Procedure: CORONARY ANGIOGRAPHY;  Surgeon: Wellington Hampshire, MD;  Location: [place] [facility];  Service: Cardiovascular;  Laterality: N/A;   CORONARY STENT INTERVENTION N/A 11/12/2021   Procedure: CORONARY STENT INTERVENTION;  Surgeon: Wellington Hampshire,  MD;  Location: [facility];  Service: Cardiovascular;  Laterality: N/A;  LAD   DENTAL SURGERY     EP IMPLANTABLE DEVICE N/A 09/23/2015   MDT CRT-D, Dr. Caryl Comes   HIATAL HERNIA REPAIR  1977   ILEOCECETOMY N/A 03/27/2017   Procedure: ILEOCECECTOMY;  Surgeon: Ileana Roup, MD;  Location: [facility];  Service: General;  Laterality: N/A;   INSERT / REPLACE / REMOVE PACEMAKER  07/2008   Complete heart block status post DDD with good function   IR FLUORO GUIDE CV LINE RIGHT  04/03/2019   IR US GUIDE VASC ACCESS RIGHT  04/03/2019   LAPAROTOMY N/A 03/27/2017   Procedure: EXPLORATORY LAPAROTOMY;  Surgeon: Ileana Roup, MD;  Location: [facility];  Service: General;  Laterality: N/A;   TONSILLECTOMY     UPPER GI ENDOSCOPY       Home Medications:  Prior to Admission medications   Medication Sig Start Date End Date Taking? Authorizing Provider  aspirin EC 81 MG tablet Take 81 mg by mouth daily.    [provider]  atorvastatin (LIPITOR) 80 MG tablet Take 1 tablet (80 mg total) by mouth daily. 11/14/21   Margie Billet,  PA-C  calcitRIOL (ROCALTROL) 0.25 MCG capsule Take 7 capsules (1.75 mcg total) by mouth every Monday, Wednesday, and Friday with hemodialysis. 11/14/21   Margie Billet, PA-C  carbamide peroxide (DEBROX) 6.5 % OTIC solution Place 5 drops into both ears 2 (two) times daily as needed (earwax). 11/13/21   Lelon Perla, MD  Carboxymethylcellul-Glycerin (LUBRICATING EYE DROPS OP) Place 1 drop into both eyes 3 (three) times daily as needed (dry eyes).    [provider]  carvedilol (COREG) 3.125 MG tablet Take twice daily. Hold the evening before and the morning of dialysis. 12/03/21   Darreld Mclean, PA-C  clopidogrel (PLAVIX) 75 MG tablet Take 1 tablet (75 mg total) by mouth daily with breakfast. 11/14/21   Margie Billet, PA-C  erythromycin ophthalmic ointment Place 1 application into the left eye at bedtime.    [provider]  gabapentin (NEURONTIN) 300 MG capsule Take 1 capsule (300 mg total) by mouth 2 (two) times daily. 11/13/21   Lelon Perla, MD  glucose blood (FREESTYLE TEST STRIPS) test strip Use to check blood sugar daily 05/01/19   [name], MD  glucose monitoring kit (FREESTYLE) monitoring kit USE TO MONITOR BLOOD GLUCOSE AS DIRECTED 05/01/19   [name], MD  lidocaine (XYLOCAINE) 5 % ointment Apply 1 application topically as needed. 02/06/21   Narda Amber K, DO  Methoxy PEG-Epoetin Beta (MIRCERA IJ) as directed. 02/26/21 07/15/22  [provider]  midodrine (PROAMATINE) 10 MG tablet Take 1 tablet (10 mg) three times a week. Add 5 mg a night after dialysis as needed for low blood pressure. 12/03/21   Sande Rives E, PA-C  mirtazapine (REMERON) 7.5 MG tablet Take 0.5-1 tablets (3.75-7.5 mg total) by mouth at bedtime. 05/30/19   [name], MD  multivitamin (RENA-VIT) TABS tablet Take 1 tablet by mouth daily. 08/22/21   [name], MD  nitroGLYCERIN (NITROSTAT) 0.4 MG SL tablet Place 1 tablet (0.4 mg total) under the  tongue every 5 (five) minutes x 3 doses as needed for chest pain. 11/13/21   Margie Billet, PA-C  oxymetazoline (AFRIN) 0.05 % nasal spray Place 1 spray into both nostrils 2 (two) times daily as needed for congestion.    [provider]  pantoprazole ([place])  40 MG tablet Take 1 tablet (40 mg total) by mouth daily. 11/14/21   Margie Billet, PA-C  saccharomyces boulardii (FLORASTOR) 250 MG capsule Take 1 capsule (250 mg total) by mouth 2 (two) times daily. 04/08/19   Regalado, Belkys A, MD  sertraline (ZOLOFT) 100 MG tablet TAKE 1 & 1/2 (ONE & ONE-HALF) TABLETS BY MOUTH ONCE DAILY 11/21/21   [name], MD  sevelamer carbonate (RENVELA) 800 MG tablet Take 2 tablets (1,600 mg total) by mouth 3 (three) times daily with meals. 11/13/21   Lelon Perla, MD  silver sulfADIAZINE (SILVADENE) 1 % cream Apply pea-sized amount to wound daily. 10/01/20   Evelina Bucy, DPM  triamcinolone cream (KENALOG) 0.1 % Apply 1 application topically as directed. 04/10/19   [provider]  vitamin B-12 (CYANOCOBALAMIN) 1000 MCG tablet Take 1,000 mcg by mouth daily.    [provider]    Inpatient Medications: Scheduled Meds:  Continuous Infusions:  calcium gluconate     PRN Meds:   Allergies:    Allergies  Allergen Reactions   Bee Venom Anaphylaxis   Lyrica [Pregabalin] Other (See Comments)    hallucinations   Prednisone Other (See Comments)    hallucinations   Zocor [Simvastatin] Nausea Only and Other (See Comments)    Headache with brand name only.  Can take the generic.    Social History:   Social History   Socioeconomic History   Marital status: Legally Separated    Spouse name: Not on file   Number of children: 1   Years of education: college   Highest education level: Not on file  Occupational History   Occupation: Retired  Tobacco Use   Smoking status: Former    Packs/day: 1.50    Years: 54.00    Total pack years: 81.00    Types: Cigarettes     Quit date: 07/18/2007    Years since quitting: 14.4    Passive exposure: Never   Smokeless tobacco: Never  Vaping Use   Vaping Use: Never used  Substance and Sexual Activity   Alcohol use: Yes    Comment: rare   Drug use: No   Sexual activity: Never  Other Topics Concern   Not on file  Social History Narrative   Married 1994 (together since 1989) 1 son, 1 stepson. 3 grandkids, 6 great grandkids      Retired from [employer]. Had 2 years of collge.       Faith: Mormon      Here on [address] since 2004 from [place]; wife recently left and relocated to [facility]    Social Determinants of Health   Financial Resource Strain: Not on [employer] Insecurity: Not on file  Transportation Needs: Not on file  Physical Activity: Not on file  Stress: Not on file  Social Connections: Not on file  Intimate Partner Violence: Not on file    Family History:    Family History  Problem Relation Age of Onset   Stroke Mother    Leukemia Father    Stroke Sister    Heart attack Brother      ROS:  Please see the history of present illness.   All other ROS reviewed and negative.     Physical Exam/Data:   Vitals:   12/23/21 1500 12/23/21 1505 12/23/21 1515 12/23/21 1530  BP:  114/74 (!) 120/50 (!) 108/52  Pulse: 68 70 65 65  Resp: '17 13 18 19  ' Temp:  TempSrc:      SpO2: 99% 97% 94% 94%   No intake or output data in the 24 hours ending 12/23/21 1622    12/03/2021    8:16 AM 11/20/2021    3:22 PM 11/12/2021    7:51 PM  Last 3 Weights  Weight (lbs) 221 lb 3.2 oz 211 lb 3.2 oz 210 lb 5.1 oz  Weight (kg) 100.336 kg 95.8 kg 95.4 kg     There is no height or weight on file to calculate BMI.  General:  Well nourished, elderly gentleman laying comfortably in the bed in no acute distress HEENT: normal Neck: no JVD Vascular: No carotid bruits; Distal pulses 2+ bilaterally Cardiac:  normal S1, S2; RRR; grade 2-3 systolic murmur  Lungs:  clear to auscultation bilaterally,  no wheezing, rhonchi or rales  Abd: soft, nontender, no hepatomegaly  Ext: no edema Skin: warm and dry  Neuro:  CNs 2-12 intact, no focal abnormalities noted Psych:  Normal affect   EKG:  The EKG was personally reviewed and demonstrates:  A-V dual paced rhythm  Telemetry:  Telemetry was personally reviewed and demonstrates:  AV dual paced rhythm   Relevant CV Studies: Left Heart Cath 11/12/21    Ost 1st Diag to 1st Diag lesion is 90% stenosed.   Ost RCA to Prox RCA lesion is 70% stenosed.   3rd Mrg lesion is 10% stenosed.   2nd Diag-2 lesion is 10% stenosed.   Mid LAD lesion is 20% stenosed.   1st Diag lesion is 30% stenosed.   2nd Diag-1 lesion is 40% stenosed.   LPAV lesion is 40% stenosed.   Ost LAD to Prox LAD lesion is 90% stenosed.   A drug-eluting stent was successfully placed using a SYNERGY XD 3.0X24.   Post intervention, there is a 0% residual stenosis.   1.  Patent LAD, diagonal and OM stent with no significant restenosis.  Severe 90% stenosis in ostial/proximal LAD is a likely culprit for myocardial infarction.  70% stenosis in ostial right coronary artery but the vessel is nondominant. 2.  Successful angioplasty and drug-eluting stent placement to the ostial/proximal LAD. 3.  I could not cross the aortic valve due to significant iliac and aortic tortuosity and I elected not to use a straight wire given that the patient just had an echocardiogram showing moderate aortic stenosis. Diagnostic Dominance: Left  Intervention    Laboratory Data:  High Sensitivity Troponin:   Recent Labs  Lab 12/23/21 1320  TROPONINIHS 223*     Chemistry Recent Labs  Lab 12/23/21 1320  NA 137  K 6.2*  CL 93*  CO2 27  GLUCOSE 226*  BUN 41*  CREATININE 7.95*  CALCIUM 9.9  GFRNONAA 6*  ANIONGAP 17*    No results for input(s): "PROT", "ALBUMIN", "AST", "ALT", "ALKPHOS", "BILITOT" in the last 168 hours. Lipids No results for input(s): "CHOL", "TRIG", "HDL", "LABVLDL",  "LDLCALC", "CHOLHDL" in the last 168 hours.  Hematology Recent Labs  Lab 12/23/21 1122  WBC 7.5  RBC 3.52*  HGB 12.5*  HCT 38.1*  MCV 108.2*  MCH 35.5*  MCHC 32.8  RDW 15.8*  PLT 108*   Thyroid No results for input(s): "TSH", "FREET4" in the last 168 hours.  BNPNo results for input(s): "BNP", "PROBNP" in the last 168 hours.  DDimer No results for input(s): "DDIMER" in the last 168 hours.   Radiology/Studies:  DG Chest 2 View  Result Date: 12/23/2021 CLINICAL DATA:  Chest pain beginning this morning. Nausea and vomiting.  EXAM: CHEST - 2 VIEW COMPARISON:  11/11/2021 FINDINGS: Heart size remains at the upper limits of normal. Pacemaker again seen in expected position. Both lungs are clear. IMPRESSION: Stable exam. No active lung disease. Electronically Signed   By: Marlaine Hind M.D.   On: 12/23/2021 11:51     Assessment and Plan:   NSTEMI  CAD s/p PCI to LAD 11/2021 - Patient presented complaining of chest pain/tightness that was nearly identical to the symptoms he felt prior to NSTEMI/PCI to LAD in 11/2021  - initial hsTn elevated to 223  - EKG showed AV dual paced rhythm - Given patient's recent stent and symptoms nearly identical to symptoms prior to NSTEMI, there is some concern that his stent may  - Plan for [facility] tomorrow  - Continue ASA, plavix  - Continue carvedilol 3.25 mg BID  - Continue lipitor 80 mg daily  - Start IV heparin per pharmacy consult   Ischemic Cardiomyopathy  Chronic combined Systolic and diastolic CHF  - Most recent echocardiogram in 11/2021 showed LVEF 30-07%, grade I diastolic dysfunction  - Continue carvedilol (OK to hold evening before and morning of dialysis to help with hypotension)  - Appears euvolemic on exam  - HD will manage volume status  - GDMT limited by renal function   Moderate Aortic Stenosis  - Continue to follow in the outpatient setting  - Will likely need repeat echo in 1 year   ESRD  - On HD MWF  - Will need nephrology  consult while admitted   Complete Heart Block  S/p ICD-CRT - Followed by Dr. Caryl Comes in the outpatient setting   Risk Assessment/Risk Scores:   TIMI Risk Score for Unstable Angina or Non-ST Elevation MI:   [name] TIMI risk score is 5, which indicates a 26% risk of all cause mortality, new or recurrent myocardial infarction or need for urgent revascularization in the next 14 days.  New York Heart Association (NYHA) Functional Class NYHA Class II   For questions or updates, please contact [facility] Please consult [email or web address] for contact info under    Signed, Margie Billet, PA-C  12/23/2021 4:22 PM As above, patient seen and examined.  Briefly he is an 88 year old male with past medical history of end-stage renal disease dialysis dependent, coronary artery disease, history of pacemaker, moderate aortic stenosis, hypertension, hyperlipidemia, diabetes mellitus, COPD, obstructive sleep apnea for evaluation of chest pain/non-ST elevation myocardial infarction.  Patient discharged in May 2023 following admission for non-ST elevation myocardial infarction.  Echocardiogram during that admission showed ejection fraction 45 to 50%, mild left ventricular hypertrophy, grade 1 diastolic dysfunction, moderate left atrial enlargement, moderate aortic stenosis with mean gradient 18 mmHg and aortic valve area 1.2 cm.  Cardiac catheterization revealed 90% first diagonal, 70% right coronary artery, 90% ostial to proximal LAD.  Patient had PCI of the LAD with drug-eluting stent.  70% stenosis of proximal right coronary artery noted but was nondominant vessel.  Patient has done well since then other than lack of energy.  This morning he developed epigastric pain radiating to his chest and left upper extremity.  There was described as a pressure associated with nausea, vomiting, dyspnea and diaphoresis.  The pain lasted for 2-1/2 hours.  It was not pleuritic.  It resolved spontaneously.  It was  identical to his pain in May.  Cardiology now asked to evaluate.  Potassium 6.2, creatinine 7.95, hemoglobin 12.5, hematocrit 38.1, platelet count 108,000.  Electrocardiogram shows AV pacing.  Troponin I 223.  1 unstable angina/non-ST elevation myocardial infarction-patient presents with symptoms that were identical to those at the time of his non-ST elevation myocardial infarction.  He is presently pain-free.  Plan to treat with aspirin and continue Plavix.  Add IV heparin.  Continue low-dose carvedilol as tolerated by blood pressure.  Continue statin.  We will need to proceed with cardiac catheterization.  The risk and benefits including myocardial infarction, CVA and death discussed and he agrees to proceed.  2 end-stage renal disease dialysis dependent-potassium is 6.2.  Would notify nephrology of his admission as he will require dialysis.  3 hyperlipidemia-continue statin.  4 history of pacemaker-we will follow-up with electrophysiology after discharge.  5 moderate aortic stenosis-noted on echocardiogram and previous admission.  He will need follow-up echoes in the future.  Kirk Ruths, MD

## 2021-12-24 ENCOUNTER — Encounter (HOSPITAL_COMMUNITY)
Admission: EM | Disposition: A | Discharge status: Home / Self Care | Payer: Self-pay | Source: Home / Self Care | Attending: Family Medicine

## 2021-12-24 DIAGNOSIS — I442 Atrioventricular block, complete: Secondary | ICD-10-CM | POA: Diagnosis present

## 2021-12-24 DIAGNOSIS — I350 Nonrheumatic aortic (valve) stenosis: Secondary | ICD-10-CM | POA: Diagnosis present

## 2021-12-24 DIAGNOSIS — Z992 Dependence on renal dialysis: Secondary | ICD-10-CM | POA: Diagnosis not present

## 2021-12-24 DIAGNOSIS — F32A Depression, unspecified: Secondary | ICD-10-CM | POA: Diagnosis present

## 2021-12-24 DIAGNOSIS — I214 Non-ST elevation (NSTEMI) myocardial infarction: Secondary | ICD-10-CM

## 2021-12-24 DIAGNOSIS — Z66 Do not resuscitate: Secondary | ICD-10-CM | POA: Diagnosis present

## 2021-12-24 DIAGNOSIS — I200 Unstable angina: Secondary | ICD-10-CM | POA: Diagnosis present

## 2021-12-24 DIAGNOSIS — I25110 Atherosclerotic heart disease of native coronary artery with unstable angina pectoris: Secondary | ICD-10-CM | POA: Diagnosis present

## 2021-12-24 DIAGNOSIS — I132 Hypertensive heart and chronic kidney disease with heart failure and with stage 5 chronic kidney disease, or end stage renal disease: Secondary | ICD-10-CM | POA: Diagnosis present

## 2021-12-24 DIAGNOSIS — E1122 Type 2 diabetes mellitus with diabetic chronic kidney disease: Secondary | ICD-10-CM | POA: Diagnosis present

## 2021-12-24 DIAGNOSIS — N186 End stage renal disease: Secondary | ICD-10-CM | POA: Diagnosis present

## 2021-12-24 DIAGNOSIS — T82855A Stenosis of coronary artery stent, initial encounter: Secondary | ICD-10-CM

## 2021-12-24 DIAGNOSIS — E875 Hyperkalemia: Secondary | ICD-10-CM | POA: Diagnosis present

## 2021-12-24 DIAGNOSIS — J449 Chronic obstructive pulmonary disease, unspecified: Secondary | ICD-10-CM | POA: Diagnosis present

## 2021-12-24 DIAGNOSIS — E208 Other hypoparathyroidism: Secondary | ICD-10-CM

## 2021-12-24 DIAGNOSIS — N2581 Secondary hyperparathyroidism of renal origin: Secondary | ICD-10-CM | POA: Diagnosis present

## 2021-12-24 DIAGNOSIS — E1142 Type 2 diabetes mellitus with diabetic polyneuropathy: Secondary | ICD-10-CM | POA: Diagnosis present

## 2021-12-24 DIAGNOSIS — I5042 Chronic combined systolic (congestive) and diastolic (congestive) heart failure: Secondary | ICD-10-CM | POA: Diagnosis present

## 2021-12-24 DIAGNOSIS — K861 Other chronic pancreatitis: Secondary | ICD-10-CM | POA: Diagnosis present

## 2021-12-24 DIAGNOSIS — E669 Obesity, unspecified: Secondary | ICD-10-CM | POA: Diagnosis present

## 2021-12-24 DIAGNOSIS — I953 Hypotension of hemodialysis: Secondary | ICD-10-CM | POA: Diagnosis not present

## 2021-12-24 DIAGNOSIS — E2089 Other specified hypoparathyroidism: Secondary | ICD-10-CM

## 2021-12-24 DIAGNOSIS — I959 Hypotension, unspecified: Secondary | ICD-10-CM

## 2021-12-24 DIAGNOSIS — Z955 Presence of coronary angioplasty implant and graft: Secondary | ICD-10-CM | POA: Diagnosis not present

## 2021-12-24 DIAGNOSIS — D696 Thrombocytopenia, unspecified: Secondary | ICD-10-CM | POA: Diagnosis present

## 2021-12-24 DIAGNOSIS — D631 Anemia in chronic kidney disease: Secondary | ICD-10-CM | POA: Diagnosis present

## 2021-12-24 DIAGNOSIS — E8889 Other specified metabolic disorders: Secondary | ICD-10-CM | POA: Diagnosis present

## 2021-12-24 DIAGNOSIS — E7800 Pure hypercholesterolemia, unspecified: Secondary | ICD-10-CM | POA: Diagnosis present

## 2021-12-24 DIAGNOSIS — Z6834 Body mass index (BMI) 34.0-34.9, adult: Secondary | ICD-10-CM | POA: Diagnosis not present

## 2021-12-24 HISTORY — PX: CORONARY IMAGING/OCT: CATH118326

## 2021-12-24 HISTORY — PX: CORONARY ANGIOGRAPHY: CATH118303

## 2021-12-24 HISTORY — PX: CORONARY BALLOON ANGIOPLASTY: CATH118233

## 2021-12-24 HISTORY — PX: INTRAVASCULAR IMAGING/OCT: CATH118326

## 2021-12-24 LAB — RENAL FUNCTION PANEL
Albumin: 3.7 g/dL (ref 3.5–5.0)
Anion gap: 14 (ref 5–15)
BUN: 48 mg/dL — ABNORMAL HIGH (ref 8–23)
CO2: 32 mmol/L (ref 22–32)
Calcium: 9.7 mg/dL (ref 8.9–10.3)
Chloride: 94 mmol/L — ABNORMAL LOW (ref 98–111)
Creatinine, Ser: 8.94 mg/dL — ABNORMAL HIGH (ref 0.61–1.24)
GFR, Estimated: 5 mL/min — ABNORMAL LOW (ref >60)
Glucose, Bld: 181 mg/dL — ABNORMAL HIGH (ref 70–99)
Phosphorus: 7.3 mg/dL — ABNORMAL HIGH (ref 2.5–4.6)
Potassium: 5.4 mmol/L — ABNORMAL HIGH (ref 3.5–5.1)
Sodium: 140 mmol/L (ref 135–145)

## 2021-12-24 LAB — TROPONIN I (HIGH SENSITIVITY): Troponin I (High Sensitivity): 1073 ng/L (ref <18)

## 2021-12-24 LAB — CBC
HCT: 37.2 % — ABNORMAL LOW (ref 39.0–52.0)
Hemoglobin: 12.3 g/dL — ABNORMAL LOW (ref 13.0–17.0)
MCH: 35.3 pg — ABNORMAL HIGH (ref 26.0–34.0)
MCHC: 33.1 g/dL (ref 30.0–36.0)
MCV: 106.9 fL — ABNORMAL HIGH (ref 80.0–100.0)
Platelets: 106 10*3/uL — ABNORMAL LOW (ref 150–400)
RBC: 3.48 MIL/uL — ABNORMAL LOW (ref 4.22–5.81)
RDW: 15.9 % — ABNORMAL HIGH (ref 11.5–15.5)
WBC: 9.2 10*3/uL (ref 4.0–10.5)
nRBC: 0 % (ref 0.0–0.2)

## 2021-12-24 LAB — HEPATITIS B CORE ANTIBODY, TOTAL: Hep B Core Total Ab: NONREACTIVE

## 2021-12-24 LAB — HEPATITIS B SURFACE ANTIBODY,QUALITATIVE: Hep B S Ab: NONREACTIVE

## 2021-12-24 LAB — POCT ACTIVATED CLOTTING TIME
Activated Clotting Time: 287 seconds
Activated Clotting Time: 311 seconds
Activated Clotting Time: 239 seconds
Activated Clotting Time: 498 seconds
Activated Clotting Time: 233 seconds
Activated Clotting Time: 546 seconds
Activated Clotting Time: 179 seconds

## 2021-12-24 LAB — HEPATITIS B SURFACE ANTIGEN: Hepatitis B Surface Ag: NONREACTIVE

## 2021-12-24 LAB — HEPATITIS C ANTIBODY: HCV Ab: NONREACTIVE

## 2021-12-24 LAB — GLUCOSE, CAPILLARY
Glucose-Capillary: 179 mg/dL — ABNORMAL HIGH (ref 70–99)
Glucose-Capillary: 138 mg/dL — ABNORMAL HIGH (ref 70–99)
Glucose-Capillary: 106 mg/dL — ABNORMAL HIGH (ref 70–99)
Glucose-Capillary: 87 mg/dL (ref 70–99)

## 2021-12-24 LAB — HEPARIN LEVEL (UNFRACTIONATED)
Heparin Unfractionated: >1.1 IU/mL — ABNORMAL HIGH (ref 0.30–0.70)
Heparin Unfractionated: 0.35 IU/mL (ref 0.30–0.70)

## 2021-12-24 SURGERY — INTRAVASCULAR IMAGING/OCT

## 2021-12-24 MED ORDER — HEPARIN (PORCINE) IN NACL 1000-0.9 UT/500ML-% IV SOLN
INTRAVENOUS | Status: DC | PRN
  Administered 2021-12-24 (×3): 500 mL

## 2021-12-24 MED ORDER — SODIUM CHLORIDE 0.9% FLUSH: 3.0000 mL | INTRAVENOUS | Status: DC | PRN

## 2021-12-24 MED ORDER — SODIUM CHLORIDE 0.9 % IV SOLN: 250.0000 mL | INTRAVENOUS | Status: DC | PRN

## 2021-12-24 MED ORDER — LABETALOL HCL 5 MG/ML IV SOLN: 10.0000 mg | INTRAVENOUS | Status: AC | PRN

## 2021-12-24 MED ORDER — CLOPIDOGREL BISULFATE 75 MG PO TABS
ORAL_TABLET | ORAL | Status: DC | PRN
  Administered 2021-12-24: 75 mg via ORAL

## 2021-12-24 MED ORDER — NITROGLYCERIN 1 MG/10 ML FOR IR/CATH LAB
INTRA_ARTERIAL | Status: DC | PRN
  Administered 2021-12-24: 200 ug via INTRACORONARY
  Administered 2021-12-24: 100 ug via INTRACORONARY
  Administered 2021-12-24: 200 ug via INTRACORONARY

## 2021-12-24 MED ORDER — FENTANYL CITRATE (PF) 100 MCG/2ML IJ SOLN
INTRAMUSCULAR | Status: DC | PRN
Start: 2021-12-24 — End: 2021-12-24
  Administered 2021-12-24 (×2): 12.5 ug via INTRAVENOUS

## 2021-12-24 MED ORDER — CHLORHEXIDINE GLUCONATE CLOTH 2 % EX PADS
6.0000 | MEDICATED_PAD | Freq: Every day | CUTANEOUS | Status: DC
  Administered 2021-12-25: 6 via TOPICAL

## 2021-12-24 MED ORDER — HYDRALAZINE HCL 20 MG/ML IJ SOLN
10.0000 mg | INTRAMUSCULAR | Status: AC | PRN
Start: 2021-12-24 — End: 2021-12-25

## 2021-12-24 MED ORDER — LIDOCAINE HCL (PF) 1 % IJ SOLN
INTRAMUSCULAR | Status: DC | PRN
  Administered 2021-12-24: 15 mL via INTRADERMAL

## 2021-12-24 MED ORDER — ONDANSETRON HCL 4 MG/2ML IJ SOLN: 4.0000 mg | Freq: Four times a day (QID) | INTRAMUSCULAR | Status: DC | PRN

## 2021-12-24 MED ORDER — HEPARIN SODIUM (PORCINE) 5000 UNIT/ML IJ SOLN
5000.0000 [IU] | Freq: Three times a day (TID) | INTRAMUSCULAR | Status: DC
  Administered 2021-12-25: 5000 [IU] via SUBCUTANEOUS
  Filled 2021-12-24: qty 1

## 2021-12-24 MED ORDER — LIDOCAINE HCL (PF) 1 % IJ SOLN
INTRAMUSCULAR | Status: AC
  Filled 2021-12-24: qty 30

## 2021-12-24 MED ORDER — FENTANYL CITRATE (PF) 100 MCG/2ML IJ SOLN
INTRAMUSCULAR | Status: AC
  Filled 2021-12-24: qty 2

## 2021-12-24 MED ORDER — CLOPIDOGREL BISULFATE 75 MG PO TABS
ORAL_TABLET | ORAL | Status: AC
  Filled 2021-12-24: qty 1

## 2021-12-24 MED ORDER — HEPARIN SODIUM (PORCINE) 1000 UNIT/ML IJ SOLN
INTRAMUSCULAR | Status: AC
  Filled 2021-12-24: qty 10

## 2021-12-24 MED ORDER — CLOPIDOGREL BISULFATE 75 MG PO TABS: 75.0000 mg | ORAL_TABLET | Freq: Every day | ORAL | Status: DC

## 2021-12-24 MED ORDER — ATORVASTATIN CALCIUM 80 MG PO TABS: 80.0000 mg | ORAL_TABLET | Freq: Every day | ORAL | Status: DC

## 2021-12-24 MED ORDER — HEPARIN SODIUM (PORCINE) 1000 UNIT/ML IJ SOLN
INTRAMUSCULAR | Status: DC | PRN
  Administered 2021-12-24: 2000 [IU] via INTRAVENOUS
  Administered 2021-12-24: 9000 [IU] via INTRAVENOUS
  Administered 2021-12-24: 3000 [IU] via INTRAVENOUS

## 2021-12-24 MED ORDER — ASPIRIN 81 MG PO CHEW
81.0000 mg | CHEWABLE_TABLET | Freq: Every day | ORAL | Status: DC
  Administered 2021-12-25: 81 mg via ORAL
  Filled 2021-12-24: qty 1

## 2021-12-24 MED ORDER — DIAZEPAM 2 MG PO TABS: 2.0000 mg | ORAL_TABLET | ORAL | Status: DC | PRN

## 2021-12-24 MED ORDER — ACETAMINOPHEN 325 MG PO TABS: 650.0000 mg | ORAL_TABLET | ORAL | Status: DC | PRN

## 2021-12-24 MED ORDER — MIDAZOLAM HCL 2 MG/2ML IJ SOLN
INTRAMUSCULAR | Status: DC | PRN
  Administered 2021-12-24 (×2): 1 mg via INTRAVENOUS

## 2021-12-24 MED ORDER — NITROGLYCERIN 1 MG/10 ML FOR IR/CATH LAB
INTRA_ARTERIAL | Status: AC
  Filled 2021-12-24: qty 10

## 2021-12-24 MED ORDER — CLOPIDOGREL BISULFATE 75 MG PO TABS
ORAL_TABLET | ORAL | Status: AC
Start: 2021-12-24 — End: ?
  Filled 2021-12-24: qty 1

## 2021-12-24 MED ORDER — SODIUM CHLORIDE 0.9% FLUSH
3.0000 mL | Freq: Two times a day (BID) | INTRAVENOUS | Status: DC
  Administered 2021-12-24 – 2021-12-25 (×2): 3 mL via INTRAVENOUS

## 2021-12-24 MED ORDER — IOHEXOL 350 MG/ML SOLN
INTRAVENOUS | Status: DC | PRN
  Administered 2021-12-24: 290 mL via INTRA_ARTERIAL

## 2021-12-24 MED ORDER — MIDAZOLAM HCL 2 MG/2ML IJ SOLN
INTRAMUSCULAR | Status: AC
  Filled 2021-12-24: qty 2

## 2021-12-24 SURGICAL SUPPLY — 25 items
BALL SAPPHIRE NC24 3.0X12 (BALLOONS) ×2
BALL SAPPHIRE NC24 3.5X15 (BALLOONS) ×2
BALL SAPPHIRE NC24 4.0X15 (BALLOONS) ×2
BALLN SAPPHIRE 2.5X12 (BALLOONS) ×2
BALLOON SAPPHIRE 2.5X12 (BALLOONS) IMPLANT
BALLOON SAPPHIRE NC24 3.0X12 (BALLOONS) IMPLANT
BALLOON SAPPHIRE NC24 3.5X15 (BALLOONS) IMPLANT
BALLOON SAPPHIRE NC24 4.0X15 (BALLOONS) IMPLANT
CATH DRAGONFLY OPTIS 2.7FR (CATHETERS) ×1 IMPLANT
CATH INFINITI 5FR MULTPACK ANG (CATHETERS) ×1 IMPLANT
CATH LAUNCHER 6FR EBU3.5 (CATHETERS) ×1 IMPLANT
CATH VISTA GUIDE 6FR XBLAD3.5 (CATHETERS) ×1 IMPLANT
CATH VISTA GUIDE 6FR XBLAD4 (CATHETERS) ×1 IMPLANT
KIT ENCORE 26 ADVANTAGE (KITS) ×1 IMPLANT
KIT HEART LEFT (KITS) ×2 IMPLANT
PACK CARDIAC CATHETERIZATION (CUSTOM PROCEDURE TRAY) ×2 IMPLANT
PINNACLE LONG 6F 25CM (SHEATH) ×2
SHEATH INTRO PINNACLE 6F 25CM (SHEATH) IMPLANT
SHEATH PINNACLE 5F 10CM (SHEATH) ×1 IMPLANT
SHEATH PINNACLE 6F 10CM (SHEATH) ×1 IMPLANT
TRANSDUCER W/STOPCOCK (MISCELLANEOUS) ×2 IMPLANT
TUBING CIL FLEX 10 FLL-RA (TUBING) ×2 IMPLANT
WIRE COUGAR XT STRL 300CM (WIRE) ×1 IMPLANT
WIRE EMERALD 3MM-J .035X150CM (WIRE) ×1 IMPLANT
WIRE EMERALD 3MM-J .035X260CM (WIRE) ×1 IMPLANT

## 2021-12-24 NOTE — Interval H&P Note (Signed)
Cath Lab Visit (complete for each Cath Lab visit)  Clinical Evaluation Leading to the Procedure:   ACS: Yes.    Non-ACS:    Anginal Classification: CCS III  Anti-ischemic medical therapy: Minimal Therapy (1 class of medications)  Non-Invasive Test Results: No non-invasive testing performed  Prior CABG: No previous CABG      History and Physical Interval Note:  12/24/2021 10:09 AM  Rodney Farley  has presented today for surgery, with the diagnosis of nstemi.  The various methods of treatment have been discussed with the patient and family. After consideration of risks, benefits and other options for treatment, the patient has consented to  Procedure(s): LEFT HEART CATH AND CORONARY ANGIOGRAPHY (N/A) as a surgical intervention.  [name] history has been reviewed, patient examined, no change in status, stable for surgery.  I have reviewed the patient's chart and labs.  Questions were answered to the patient's satisfaction.     Shelva Majestic

## 2021-12-24 NOTE — Progress Notes (Signed)
Site area: Right groin a 6 french arterial sheath was removed  Site Prior to Removal:  Level 0  Pressure Applied For 30 MINUTES    Bedrest Beginning at 1600pm X 4 hours  Manual:   Yes.    Patient Status During Pull:  stable  Post Pull Groin Site:  Level 0  Post Pull Instructions Given:  Yes.    Post Pull Pulses Present:  Yes.    Dressing Applied:  Yes.    Comments:

## 2021-12-24 NOTE — Progress Notes (Signed)
Pt receives out-pt HD at [facility] on MWF. Pt arrives at 11:00 for 11:20 chair time. Will assist as needed.   Tevita Gomer Renal Navigator [phone] 

## 2021-12-24 NOTE — Assessment & Plan Note (Signed)
Follow with dialysis

## 2021-12-24 NOTE — Assessment & Plan Note (Signed)
trend

## 2021-12-24 NOTE — Progress Notes (Signed)
[facility] Progress Note   Subjective:   Pt reports some blurry vision this AM but thinks it is because of eye drainage. Denies SOB, CP, dizziness, and nausea. K+ improved to 5.4. Discussed plan for HD after heart cath this AM.   Objective Vitals:   12/24/21 0101 12/24/21 0354 12/24/21 0803 12/24/21 0807  BP: 137/70 (!) 114/52 (!) 95/47 (!) 90/51  Pulse: 68 69 70 70  Resp: _0 Temp: (!) 97.4 F (36.3 C) 97.6 F (36.4 C) 97.6 F (36.4 C)   TempSrc: Oral Oral Axillary   SpO2: 96% 100% 94% 95%  Weight: 94.4 kg     Height: _1  (1.702 m)      Physical Exam General: Elderly male, alert and in NAD Heart: RRR, no murmurs, rubs or gallops Lungs: CTA bilaterally without wheezing, rhonchi or rales Abdomen: Soft, non-distended, +BS Extremities: No edema b/l lower extermities Dialysis Access: RUE AVF + t/b  Additional Objective Labs: Basic Metabolic Panel: Recent Labs  Lab 12/23/21 1320 12/23/21 1944 12/24/21 0144  NA 137  --  140  K 6.2* 5.5* 5.4*  CL 93*  --  94*  CO2 27  --  32  GLUCOSE 226*  --  181*  BUN 41*  --  48*  CREATININE 7.95*  --  8.94*  CALCIUM 9.9  --  9.7  PHOS  --   --  7.3*   Liver Function Tests: Recent Labs  Lab 12/24/21 0144  ALBUMIN 3.7   No results for input(s): "LIPASE", "AMYLASE" in the last 168 hours. CBC: Recent Labs  Lab 12/23/21 1122 12/24/21 0427  WBC 7.5 9.2  HGB 12.5* 12.3*  HCT 38.1* 37.2*  MCV 108.2* 106.9*  PLT 108* 106*   Blood Culture    Component Value Date/Time   SDES URINE, CLEAN CATCH 04/07/2019 1931   SPECREQUEST  04/07/2019 1931    NONE Performed at [facility], [place] [address], [name], [place] [address]    CULT >=100,000 COLONIES/mL PSEUDOMONAS AERUGINOSA (A) 04/07/2019 1931   REPTSTATUS 04/09/2019 FINAL 04/07/2019 1931    Cardiac Enzymes: No results for input(s): "CKTOTAL", "CKMB", "CKMBINDEX", "TROPONINI" in the last 168 hours. CBG: Recent Labs  Lab 12/23/21 2142  12/24/21 0144 12/24/21 0644  GLUCAP 246* 179* 138*   Iron Studies: No results for input(s): "IRON", "TIBC", "TRANSFERRIN", "FERRITIN" in the last 72 hours. _2 @ Studies/Results: DG Chest 2 View  Result Date: 12/23/2021 CLINICAL DATA:  Chest pain beginning this morning. Nausea and vomiting. EXAM: CHEST - 2 VIEW COMPARISON:  11/11/2021 FINDINGS: Heart size remains at the upper limits of normal. Pacemaker again seen in expected position. Both lungs are clear. IMPRESSION: Stable exam. No active lung disease. Electronically Signed   By: Marlaine Hind M.D.   On: 12/23/2021 11:51   Medications:  sodium chloride     sodium chloride 10 mL/hr at 12/24/21 0551   heparin Stopped (12/24/21 0933)    aspirin EC  81 mg Oral Daily   atorvastatin  80 mg Oral QHS   calcitRIOL  1.75 mcg Oral Q M,W,F-HD   clopidogrel  75 mg Oral Q breakfast   erythromycin  1 application  Left Eye QHS   gabapentin  300 mg Oral QHS   insulin aspart  0-6 Units Subcutaneous TID WC   midodrine  10 mg Oral Q M,W,F   multivitamin  1 tablet Oral Daily   pantoprazole  40 mg Oral Daily   saccharomyces boulardii  250  mg Oral BID   sevelamer carbonate  1,600 mg Oral TID WC   sodium chloride flush  3 mL Intravenous Q12H   vitamin B-12  1,000 mcg Oral Daily    Outpatient Dialysis Orders: [facility] 4 hours BF 400, DF 500  2 K/2 Ca bath EDW 95.5 kg AVF Meds: mircera 75 mcg every 4 weeks (last dose on 12/10/21), venofer 50 mg weekly, calcitriol 1.75 mcg three times a week with HD   Last post weight - 95.7 kg on 12/22/21. Earlier this month was above EDW (98.9 kg post weight on 6/5) but has come down over time and met or approached EDW recently  Assessment/Plan: 1. Chest pain: Unstable angina, planned for heart cath this AM. Per cardiology.  2. ESRD: MWF schedule, hyperkalemia resolved with temporizing measures. No emergent indications for HD right now. Will plan for dialysis after his heart cath today.  3. HTN/volume:   BP soft, reports this is typical for him and he takes midodrine prior to dialysis. No volume overload on exam or CXR and is below his prior EDW. Minimal UF with HD today.  4. Anemia: Hgb 12.3. No ESA indicated at present.  5. Secondary hyperparathyroidism:  Calcium controlled, phos elevated. Resume renvela once tolerating PO 6. Nutrition:  will need renal diet/fluid restrictions  Anice Paganini, PA-C 12/24/2021, 9:37 AM  [facility] Pager: [phone]

## 2021-12-24 NOTE — Assessment & Plan Note (Signed)
Trend

## 2021-12-24 NOTE — Assessment & Plan Note (Signed)
noted 

## 2021-12-24 NOTE — Assessment & Plan Note (Signed)
MWF dialysis Per renal, will follow

## 2021-12-24 NOTE — H&P (View-Only) (Signed)
Progress Note  Patient Name: Rodney Farley Date of Encounter: 12/24/2021  [facility] Cardiologist: Theran [name], MD   Subjective   No CP or dyspnea  Inpatient Medications    Scheduled Meds:  aspirin EC  81 mg Oral Daily   atorvastatin  80 mg Oral QHS   calcitRIOL  1.75 mcg Oral Q M,W,F-HD   clopidogrel  75 mg Oral Q breakfast   erythromycin  1 application  Left Eye QHS   gabapentin  300 mg Oral QHS   insulin aspart  0-6 Units Subcutaneous TID WC   midodrine  10 mg Oral Q M,W,F   multivitamin  1 tablet Oral Daily   pantoprazole  40 mg Oral Daily   saccharomyces boulardii  250 mg Oral BID   sevelamer carbonate  1,600 mg Oral TID WC   sodium chloride flush  3 mL Intravenous Q12H   vitamin B-12  1,000 mcg Oral Daily   Continuous Infusions:  sodium chloride     sodium chloride 10 mL/hr at 12/24/21 0551   heparin 1,200 Units/hr (12/23/21 1746)   PRN Meds: sodium chloride, acetaminophen, carboxymethylcellul-glycerin, midodrine, nitroGLYCERIN, ondansetron (ZOFRAN) IV, sodium chloride flush   Vital Signs    Vitals:   12/24/21 0037 12/24/21 0101 12/24/21 0354 12/24/21 0803  BP:  137/70 (!) 114/52 (!) 95/47  Pulse:  68 69 70  Resp:  18 16 18   Temp: (!) 97.5 F (36.4 C) (!) 97.4 F (36.3 C) 97.6 F (36.4 C) 97.6 F (36.4 C)  TempSrc: Oral Oral Oral Axillary  SpO2:  96% 100% 94%  Weight:  94.4 kg    Height:  5\' 7"  (1.702 m)     No intake or output data in the 24 hours ending 12/24/21 0816    12/24/2021    1:01 AM 12/03/2021    8:16 AM 11/20/2021    3:22 PM  Last 3 Weights  Weight (lbs) 208 lb 1.8 oz 221 lb 3.2 oz 211 lb 3.2 oz  Weight (kg) 94.4 kg 100.336 kg 95.8 kg      Telemetry    AV paced - Personally Reviewed  Physical Exam   GEN: No acute distress.   Neck: No JVD Cardiac: RRR, 3/6 systolic murmur Respiratory: Clear to auscultation bilaterally. GI: Soft, nontender, non-distended  MS: No edema Neuro:  Nonfocal  Psych: Normal affect    Labs    High Sensitivity Troponin:   Recent Labs  Lab 12/23/21 1320 12/23/21 1600 12/24/21 0144  TROPONINIHS 223* 653* 1,073*     Chemistry Recent Labs  Lab 12/23/21 1320 12/23/21 1944 12/24/21 0144  NA 137  --  140  K 6.2* 5.5* 5.4*  CL 93*  --  94*  CO2 27  --  32  GLUCOSE 226*  --  181*  BUN 41*  --  48*  CREATININE 7.95*  --  8.94*  CALCIUM 9.9  --  9.7  ALBUMIN  --   --  3.7  GFRNONAA 6*  --  5*  ANIONGAP 17*  --  14     Hematology Recent Labs  Lab 12/23/21 1122 12/24/21 0427  WBC 7.5 9.2  RBC 3.52* 3.48*  HGB 12.5* 12.3*  HCT 38.1* 37.2*  MCV 108.2* 106.9*  MCH 35.5* 35.3*  MCHC 32.8 33.1  RDW 15.8* 15.9*  PLT 108* 106*     Radiology    DG Chest 2 View  Result Date: 12/23/2021 CLINICAL DATA:  Chest pain beginning this morning. Nausea and vomiting.  EXAM: CHEST - 2 VIEW COMPARISON:  11/11/2021 FINDINGS: Heart size remains at the upper limits of normal. Pacemaker again seen in expected position. Both lungs are clear. IMPRESSION: Stable exam. No active lung disease. Electronically Signed   By: Marlaine Hind M.D.   On: 12/23/2021 11:51     Patient Profile     88 year old male with past medical history of end-stage renal disease dialysis dependent, coronary artery disease, history of pacemaker, moderate aortic stenosis, hypertension, hyperlipidemia, diabetes mellitus, COPD, obstructive sleep apnea for evaluation of non-ST elevation myocardial infarction.  Patient discharged in May 2023 following admission for non-ST elevation myocardial infarction.  Echocardiogram during that admission showed ejection fraction 45 to 50%, mild left ventricular hypertrophy, grade 1 diastolic dysfunction, moderate left atrial enlargement, moderate aortic stenosis with mean gradient 18 mmHg and aortic valve area 1.2 cm.  Cardiac catheterization revealed 90% first diagonal, 70% right coronary artery, 90% ostial to proximal LAD.  Patient had PCI of the LAD with drug-eluting stent.   70% stenosis of proximal right coronary artery noted but was nondominant vessel.  Readmitted with recurrent CP.   Assessment & Plan    1 unstable angina/non-ST elevation myocardial infarction-patient presents with symptoms that were identical to those at the time of his non-ST elevation myocardial infarction.  His troponin has increased further and is consistent with non-ST elevation myocardial infarction.  Continue aspirin, Plavix, heparin, carvedilol and statin.  Plan is for cardiac catheterization today as outlined previously.     2 end-stage renal disease dialysis dependent-dialysis per nephrology.   3 hyperlipidemia-continue statin.   4 history of pacemaker-we will follow-up with electrophysiology after discharge.   5 moderate aortic stenosis-noted on echocardiogram and previous admission.  He will need follow-up echoes in the future.  For questions or updates, please contact [place] Please consult [email or web address] for contact info under        Signed, Kirk Ruths, MD  12/24/2021, 8:16 AM

## 2021-12-24 NOTE — Plan of Care (Signed)
  Problem: Activity: Goal: Ability to tolerate increased activity will improve Outcome: Progressing   Problem: Cardiac: Goal: Ability to achieve and maintain adequate cardiovascular perfusion will improve Outcome: Progressing   Problem: Health Behavior/Discharge Planning: Goal: Ability to safely manage health-related needs after discharge will improve Outcome: Progressing   Problem: Health Behavior/Discharge Planning: Goal: Ability to manage health-related needs will improve Outcome: Progressing

## 2021-12-24 NOTE — Progress Notes (Signed)
Patient complained of nausea, recheck b/p was 82/46, turned off UF and gave 200 ml bolus. Pt stated that nausea subsided. Pt was given some gingerale and cbg was checked. Result was 106.

## 2021-12-24 NOTE — Assessment & Plan Note (Addendum)
Troponin as high as 1073  S/p cath, due to focal 90% proximal in stent restenosis with thrombus and probable stent underexpansion, mild residual nonobstructive cad in L circumflex vessel with 30/40, and 50% stenoses.  Previously placed stent in OM3 vessel difficult to visualize.  No significant change in previous 70% RCA stenosis in nondominant RCA vessel.  OCT demonstrating prior Lad stent underexpansion ostially with calcification and plaque.  Difficult but successful PCI to ostium proximal LAD stent treated with PTCA with Araminta Zorn 2.5 x 49mm, 3 x 12 mm compliant balloon, 3.5 and 4 x 15 mm non compliant balloons. Cards recommending DAPT indefinitely, medical therapy for CAD Aggressive lipid lowering therapy with target LDL <55 Stable for discharge today per cards, indefinite DAPT, continue statin, coreg d/c'd due to hypotension

## 2021-12-24 NOTE — Progress Notes (Signed)
   12/24/21 0101  Vitals  Temp (!) 97.4 F (36.3 C)  Temp Source Oral  BP 137/70  MAP (mmHg) 91  BP Location Right Arm  BP Method Automatic  Patient Position (if appropriate) Lying  Pulse Rate 68  Pulse Rate Source Monitor  ECG Heart Rate 73  Resp 18  Level of Consciousness  Level of Consciousness Alert  MEWS COLOR  MEWS Score Color Green  Oxygen Therapy  SpO2 96 %  O2 Device Room Air  Pain Assessment  Pain Scale 0-10  Pain Score 0  Height and Weight  Height 5\' 7"  (1.702 m)  Weight 94.4 kg  BSA (Calculated - sq m) 2.11 sq meters  BMI (Calculated) 32.59  Weight in (lb) to have BMI = 25 159.3  MEWS Score  MEWS Temp 0  MEWS Systolic 0  MEWS Pulse 0  MEWS RR 0  MEWS LOC 0  MEWS Score 0   Pt transported to 4E17 from ED. CCMD called. CHG bath completed. Pt denies pain. Pt oriented to unit. RN will continue to monitor pt.

## 2021-12-24 NOTE — Assessment & Plan Note (Addendum)
Moderate AS noted 11/2021 Follow with cards outpatient

## 2021-12-24 NOTE — Progress Notes (Signed)
Progress Note  Patient Name: Rodney Farley Date of Encounter: 12/24/2021  [facility] Cardiologist: Lavante [name], MD   Subjective   No CP or dyspnea  Inpatient Medications    Scheduled Meds:  aspirin EC  81 mg Oral Daily   atorvastatin  80 mg Oral QHS   calcitRIOL  1.75 mcg Oral Q M,W,F-HD   clopidogrel  75 mg Oral Q breakfast   erythromycin  1 application  Left Eye QHS   gabapentin  300 mg Oral QHS   insulin aspart  0-6 Units Subcutaneous TID WC   midodrine  10 mg Oral Q M,W,F   multivitamin  1 tablet Oral Daily   pantoprazole  40 mg Oral Daily   saccharomyces boulardii  250 mg Oral BID   sevelamer carbonate  1,600 mg Oral TID WC   sodium chloride flush  3 mL Intravenous Q12H   vitamin B-12  1,000 mcg Oral Daily   Continuous Infusions:  sodium chloride     sodium chloride 10 mL/hr at 12/24/21 0551   heparin 1,200 Units/hr (12/23/21 1746)   PRN Meds: sodium chloride, acetaminophen, carboxymethylcellul-glycerin, midodrine, nitroGLYCERIN, ondansetron (ZOFRAN) IV, sodium chloride flush   Vital Signs    Vitals:   12/24/21 0037 12/24/21 0101 12/24/21 0354 12/24/21 0803  BP:  137/70 (!) 114/52 (!) 95/47  Pulse:  68 69 70  Resp:  18 16 18   Temp: (!) 97.5 F (36.4 C) (!) 97.4 F (36.3 C) 97.6 F (36.4 C) 97.6 F (36.4 C)  TempSrc: Oral Oral Oral Axillary  SpO2:  96% 100% 94%  Weight:  94.4 kg    Height:  5\' 7"  (1.702 m)     No intake or output data in the 24 hours ending 12/24/21 0816    12/24/2021    1:01 AM 12/03/2021    8:16 AM 11/20/2021    3:22 PM  Last 3 Weights  Weight (lbs) 208 lb 1.8 oz 221 lb 3.2 oz 211 lb 3.2 oz  Weight (kg) 94.4 kg 100.336 kg 95.8 kg      Telemetry    AV paced - Personally Reviewed  Physical Exam   GEN: No acute distress.   Neck: No JVD Cardiac: RRR, 3/6 systolic murmur Respiratory: Clear to auscultation bilaterally. GI: Soft, nontender, non-distended  MS: No edema Neuro:  Nonfocal  Psych: Normal affect    Labs    High Sensitivity Troponin:   Recent Labs  Lab 12/23/21 1320 12/23/21 1600 12/24/21 0144  TROPONINIHS 223* 653* 1,073*     Chemistry Recent Labs  Lab 12/23/21 1320 12/23/21 1944 12/24/21 0144  NA 137  --  140  K 6.2* 5.5* 5.4*  CL 93*  --  94*  CO2 27  --  32  GLUCOSE 226*  --  181*  BUN 41*  --  48*  CREATININE 7.95*  --  8.94*  CALCIUM 9.9  --  9.7  ALBUMIN  --   --  3.7  GFRNONAA 6*  --  5*  ANIONGAP 17*  --  14     Hematology Recent Labs  Lab 12/23/21 1122 12/24/21 0427  WBC 7.5 9.2  RBC 3.52* 3.48*  HGB 12.5* 12.3*  HCT 38.1* 37.2*  MCV 108.2* 106.9*  MCH 35.5* 35.3*  MCHC 32.8 33.1  RDW 15.8* 15.9*  PLT 108* 106*     Radiology    DG Chest 2 View  Result Date: 12/23/2021 CLINICAL DATA:  Chest pain beginning this morning. Nausea and vomiting.  EXAM: CHEST - 2 VIEW COMPARISON:  11/11/2021 FINDINGS: Heart size remains at the upper limits of normal. Pacemaker again seen in expected position. Both lungs are clear. IMPRESSION: Stable exam. No active lung disease. Electronically Signed   By: Marlaine Hind M.D.   On: 12/23/2021 11:51     Patient Profile     88 year old male with past medical history of end-stage renal disease dialysis dependent, coronary artery disease, history of pacemaker, moderate aortic stenosis, hypertension, hyperlipidemia, diabetes mellitus, COPD, obstructive sleep apnea for evaluation of non-ST elevation myocardial infarction.  Patient discharged in May 2023 following admission for non-ST elevation myocardial infarction.  Echocardiogram during that admission showed ejection fraction 45 to 50%, mild left ventricular hypertrophy, grade 1 diastolic dysfunction, moderate left atrial enlargement, moderate aortic stenosis with mean gradient 18 mmHg and aortic valve area 1.2 cm.  Cardiac catheterization revealed 90% first diagonal, 70% right coronary artery, 90% ostial to proximal LAD.  Patient had PCI of the LAD with drug-eluting stent.   70% stenosis of proximal right coronary artery noted but was nondominant vessel.  Readmitted with recurrent CP.   Assessment & Plan    1 unstable angina/non-ST elevation myocardial infarction-patient presents with symptoms that were identical to those at the time of his non-ST elevation myocardial infarction.  His troponin has increased further and is consistent with non-ST elevation myocardial infarction.  Continue aspirin, Plavix, heparin, carvedilol and statin.  Plan is for cardiac catheterization today as outlined previously.     2 end-stage renal disease dialysis dependent-dialysis per nephrology.   3 hyperlipidemia-continue statin.   4 history of pacemaker-we will follow-up with electrophysiology after discharge.   5 moderate aortic stenosis-noted on echocardiogram and previous admission.  He will need follow-up echoes in the future.  For questions or updates, please contact [place] Please consult [email or web address] for contact info under        Signed, Kirk Ruths, MD  12/24/2021, 8:16 AM

## 2021-12-24 NOTE — Progress Notes (Signed)
PROGRESS NOTE    Rodney Farley  ZOX:[id number] DOB: May 13, 1932 DOA: 12/23/2021 PCP: [name], MD  Chief Complaint  Patient presents with   Chest Pain    Brief Narrative:   Rodney Farley is Rodney Farley 88 y.o. male with medical history significant of CAD status post multiple PCI with stent placement's, NSTEMI, complete heart block status post PPM, ischemic cardiomyopathy status post AICD, moderate aortic stenosis, hypertension, hyperlipidemia, diabetes mellitus type 2, OSA, COPD, TIA, BPH. Patient described total torso pain and pressure with radiation down left arm that started around 7:30 AM. Chest pain was non-exertional. Initially patient thought he had acid reflux, but then pain significantly worsened. Patient took double his gabapentin dose and 3 aspirin 81 mg tablets. Patient called his daughter-in-law and she called EMS. When EMS arrived, he was given nitroglycerin but soon vomited; no relief. He has associated dyspnea.    Assessment & Plan:   Principal Problem:   NSTEMI (non-ST elevated myocardial infarction) ([place]) Active Problems:   Hyperkalemia, diminished renal excretion   ESRD on hemodialysis ([place])   Secondary hyperparathyroidism of renal origin ([place])   Hypotension   Aortic stenosis   Heart failure with mid-range ejection fraction (HFmEF) (HCC)   Depression   Hyperlipidemia   Thrombocytopenia (HCC)   Anemia in chronic kidney disease   Type II diabetes mellitus (HCC)   Obesity (BMI 30-39.9)   Ischemic cardiomyopathy   Pacemaker   GERD (gastroesophageal reflux disease)   Non-ST elevation (NSTEMI) myocardial infarction [facility])   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   Secondary hypoparathyroidism ([place])   Assessment and Plan: * NSTEMI (non-ST elevated myocardial infarction) ([place]) Troponin as high as 1073  S/p cath, due to focal 90% proximal in stent restenosis with thrombus and probable stent underexpansion, mild residual  nonobstructive cad in L circumflex vessel with 30/40, and 50% stenoses.  Previously placed stent in OM3 vessel difficult to visualize.  No significant change in previous 70% RCA stenosis in nondominant RCA vessel.  OCT demonstrating prior Lad stent underexpansion ostially with calcification and plaque.  Difficult but successful PCI to ostium proximal LAD stent treated with PTCA with Giomar Gusler 2.5 x 76mm, 3 x 12 mm compliant balloon, 3.5 and 4 x 15 mm non compliant balloons. Cards recommending DAPT indefinitely, medical therapy for CAD Aggressive lipid lowering therapy with target LDL <55  Hyperkalemia, diminished renal excretion Follow with dialysis  Hypotension On dialysis, continue midodrine MWF  ESRD on hemodialysis [facility]) MWF dialysis Per renal, will follow  Aortic stenosis Moderate AS noted 11/2021  Heart failure with mid-range ejection fraction (HFmEF) (HCC) Volume per renal  Echo 11/2021 with EF 45-50%  Depression No longer on remeron or zoloft  Hyperlipidemia lipitor 80 mg  Anemia in chronic kidney disease trend  Thrombocytopenia ([place]) Trend  Type II diabetes mellitus (HCC) SSI A1c 11/2021 7.5  Obesity (BMI 30-39.9) noted      DVT prophylaxis: heparin Code Status: dnr Family Communication: none Disposition:   Status is: Observation The patient will require care spanning > 2 midnights and should be moved to inpatient because: pending further cards recs, resolution of sx   Consultants:  cardiology  Procedures:  Cath NSTEMI secondary to focal 90% proximal in-stent restenosis with thrombus and probable stent underexpansion.   Mild residual nonobstructive CAD in the left circumflex vessel with 30, 40 and 50% stenoses.  Damyra Luscher previously placed stent in the OM3 vessel was difficult to visualize.   No significant change in the previous 70%  proximal RCA stenosis in nondominant RCA vessel.   OCT demonstrating prior LAD stent underexpansion ostially with calcification and  plaque.   Difficult but successful PCI to the ostium proximal LAD stent treated with PTCA with Frederica Chrestman 2.5 x 12 mm, 3.0 x 12 mm compliant balloon, 3.5 and 4.0 x 15 mm noncompliant balloons.   RECOMMENDATION: DAPT indefinitely.  Medical therapy for concomitant CAD.  The patient is on dialysis on Monday, Wednesday, and Fridays.  Aggressive lipid-lowering therapy with target LDL less than 55.  Antimicrobials:  Anti-infectives (From admission, onward)    None       Subjective: Seen in dialysis, intermittently hypotensive - requested midodrine as ordered No complaints, no LH, dizziness, vision changes  Objective: Vitals:   12/24/21 1917 12/24/21 1930 12/24/21 2000 12/24/21 2020  BP: (!) 102/43 (!) 83/41 (!) 77/47 (!) 104/48  Pulse:  70 70 70  Resp:  (!) 21 (!) 21 (!) 21  Temp:    98 F (36.7 C)  TempSrc:    Oral  SpO2:  94% 97% 97%  Weight:      Height:       No intake or output data in the 24 hours ending 12/24/21 2114 Filed Weights   12/24/21 0101 12/24/21 1634  Weight: 94.4 kg 98.6 kg    Examination:  General exam: Appears calm and comfortable  Respiratory system: unlabored Cardiovascular system:RRR Gastrointestinal system: Abdomen is nondistended, soft and nontender. Central nervous system: Alert and oriented. No focal neurological deficits. Extremities: no LEE    Data Reviewed: I have personally reviewed following labs and imaging studies  CBC: Recent Labs  Lab 12/23/21 1122 12/24/21 0427  WBC 7.5 9.2  HGB 12.5* 12.3*  HCT 38.1* 37.2*  MCV 108.2* 106.9*  PLT 108* 106*    Basic Metabolic Panel: Recent Labs  Lab 12/23/21 1320 12/23/21 1944 12/24/21 0144  NA 137  --  140  K 6.2* 5.5* 5.4*  CL 93*  --  94*  CO2 27  --  32  GLUCOSE 226*  --  181*  BUN 41*  --  48*  CREATININE 7.95*  --  8.94*  CALCIUM 9.9  --  9.7  PHOS  --   --  7.3*    GFR: Estimated Creatinine Clearance: 6.5 mL/min (Keyondra Lagrand) (by C-G formula based on SCr of 8.94 mg/dL (H)).  Liver  Function Tests: Recent Labs  Lab 12/24/21 0144  ALBUMIN 3.7    CBG: Recent Labs  Lab 12/23/21 2142 12/24/21 0144 12/24/21 0644 12/24/21 1726  GLUCAP 246* 179* 138* 106*     No results found for this or any previous visit (from the past 240 hour(s)).       Radiology Studies: CARDIAC CATHETERIZATION  Result Date: 12/24/2021   Dist LM to Ost LAD lesion is 90% stenosed.   Prox Cx lesion is 30% stenosed.   Mid Cx to Dist Cx lesion is 40% stenosed.   3rd Mrg lesion is 50% stenosed.   Mid Cx lesion is 30% stenosed.   Ost RCA to Prox RCA lesion is 70% stenosed.   Previously placed 2nd Diag stent of unknown type is  widely patent.   Post intervention, there is Captain Blucher 0% residual stenosis. NSTEMI secondary to focal 90% proximal in-stent restenosis with thrombus and probable stent underexpansion. Mild residual nonobstructive CAD in the left circumflex vessel with 30, 40 and 50% stenoses.  Georgetta Crafton previously placed stent in the OM3 vessel was difficult to visualize. No significant change in the previous  70% proximal RCA stenosis in nondominant RCA vessel. OCT demonstrating prior LAD stent underexpansion ostially with calcification and plaque. Difficult but successful PCI to the ostium proximal LAD stent treated with PTCA with Kush Farabee 2.5 x 12 mm, 3.0 x 12 mm compliant balloon, 3.5 and 4.0 x 15 mm noncompliant balloons. RECOMMENDATION: DAPT indefinitely.  Medical therapy for concomitant CAD.  The patient is on dialysis on Monday, Wednesday, and Fridays.  Aggressive lipid-lowering therapy with target LDL less than 55.   DG Chest 2 View  Result Date: 12/23/2021 CLINICAL DATA:  Chest pain beginning this morning. Nausea and vomiting. EXAM: CHEST - 2 VIEW COMPARISON:  11/11/2021 FINDINGS: Heart size remains at the upper limits of normal. Pacemaker again seen in expected position. Both lungs are clear. IMPRESSION: Stable exam. No active lung disease. Electronically Signed   By: Marlaine Hind M.D.   On: 12/23/2021 11:51         Scheduled Meds:  aspirin EC  81 mg Oral Daily   atorvastatin  80 mg Oral QHS   calcitRIOL  1.75 mcg Oral Q M,W,F-HD   Chlorhexidine Gluconate Cloth  6 each Topical Q0600   clopidogrel  75 mg Oral Q breakfast   erythromycin  1 application  Left Eye QHS   gabapentin  300 mg Oral QHS   insulin aspart  0-6 Units Subcutaneous TID WC   midodrine  10 mg Oral Q M,W,F   multivitamin  1 tablet Oral Daily   pantoprazole  40 mg Oral Daily   saccharomyces boulardii  250 mg Oral BID   sevelamer carbonate  1,600 mg Oral TID WC   sodium chloride flush  3 mL Intravenous Q12H   vitamin B-12  1,000 mcg Oral Daily   Continuous Infusions:  sodium chloride       LOS: 0 days    Time spent: over 30 min    Fayrene Helper, MD [facility]   To contact the attending provider between 7A-7P or the covering provider during after hours 7P-7A, please log into the web site [email or web address] and access using universal [place] password for that web site. If you do not have the password, please call the hospital operator.  12/24/2021, 9:14 PM

## 2021-12-24 NOTE — Assessment & Plan Note (Addendum)
SSI A1c 11/2021 7.5 Not on any meds outpatient

## 2021-12-24 NOTE — Assessment & Plan Note (Signed)
Volume per renal  Echo 11/2021 with EF 45-50%

## 2021-12-24 NOTE — Progress Notes (Signed)
Pts primary md came in to unit to visit. Did give order to give 10mg  dose of Midodrine for low b/p. Medication has been given. Will continue to monitor and tx as indicated.

## 2021-12-24 NOTE — Progress Notes (Signed)
TRH night cross cover note:  I was contacted by RN requesting clarification regarding whether or not n.p.o. status needs to be maintained.   Per my chart review, including review of most recent rounding hospitalist progress note, this is an 88 year old male with history of ESRD on hemodialysis, type 2 diabetes mellitus, admitted on 12/23/2021 with unstable angina/NSTEMI.  It appears that he was n.p.o. leading up to his cardiac catheterization, which occurred earlier today (12/24/21), at which time the patient underwent PCI with stent placement.  Per review of ensuing cardiology documentation, it does not appear that there are any imminent cardiac procedures planned for the remainder of this hospitalization.  Therefore, there does not appear to be any additional indication for perpetuation of n.p.o. status at this time.  Per additional review, diet order has been updated to renal/carb modified diet, which will be continued.     Babs Bertin, DO Hospitalist

## 2021-12-24 NOTE — Hospital Course (Addendum)
Rodney Farley is Rodney Farley 88 y.o. male with medical history significant of CAD status post multiple PCI with stent placement's, NSTEMI, complete heart block status post PPM, ischemic cardiomyopathy status post AICD, moderate aortic stenosis, hypertension, hyperlipidemia, diabetes mellitus type 2, OSA, COPD, TIA, BPH.  He had recent LHC s/p angioplasty and DES to ostial/proximal LAD.  He was readmitted with an NSTEMI.  Now s/p cath and PCI by cardiology.  Stable for discharge with recommendation for indefinite DAPT, medical therapy for CAD, aggressive lipid lowering therapy.  See below for additional details

## 2021-12-24 NOTE — Assessment & Plan Note (Signed)
On dialysis, continue midodrine MWF

## 2021-12-24 NOTE — Assessment & Plan Note (Signed)
No longer on remeron or zoloft

## 2021-12-24 NOTE — Assessment & Plan Note (Signed)
lipitor 80 mg

## 2021-12-24 NOTE — Progress Notes (Signed)
ANTICOAGULATION CONSULT NOTE - Follow Up Consult  Pharmacy Consult for heparin Indication:  NSTEMI  Labs: Recent Labs    12/23/21 1122 12/23/21 1320 12/23/21 1600 12/24/21 0144 12/24/21 0427  HGB 12.5*  --   --   --  12.3*  HCT 38.1*  --   --   --  37.2*  PLT 108*  --   --   --  106*  HEPARINUNFRC  --   --   --  >1.10* 0.35  CREATININE  --  7.95*  --  8.94*  --   TROPONINIHS  --  223* 653* 1,073*  --     Assessment/Plan:  88yo male therapeutic on heparin with initial dosing for NSTEMI. Will continue infusion at current rate of 1200 units/hr and confirm stable with additional level.   Wynona Neat, PharmD, BCPS  12/24/2021,6:06 AM

## 2021-12-25 ENCOUNTER — Encounter (HOSPITAL_COMMUNITY): Payer: Self-pay | Admitting: Cardiovascular Disease

## 2021-12-25 DIAGNOSIS — I214 Non-ST elevation (NSTEMI) myocardial infarction: Secondary | ICD-10-CM | POA: Diagnosis not present

## 2021-12-25 LAB — CBC WITH DIFFERENTIAL/PLATELET
Abs Immature Granulocytes: 0.03 10*3/uL (ref 0.00–0.07)
Basophils Absolute: 0 10*3/uL (ref 0.0–0.1)
Basophils Relative: 0 %
Eosinophils Absolute: 0.2 10*3/uL (ref 0.0–0.5)
Eosinophils Relative: 3 %
HCT: 30.8 % — ABNORMAL LOW (ref 39.0–52.0)
Hemoglobin: 10 g/dL — ABNORMAL LOW (ref 13.0–17.0)
Immature Granulocytes: 0 %
Lymphocytes Relative: 25 %
Lymphs Abs: 2 10*3/uL (ref 0.7–4.0)
MCH: 34.7 pg — ABNORMAL HIGH (ref 26.0–34.0)
MCHC: 32.5 g/dL (ref 30.0–36.0)
MCV: 106.9 fL — ABNORMAL HIGH (ref 80.0–100.0)
Monocytes Absolute: 0.8 10*3/uL (ref 0.1–1.0)
Monocytes Relative: 10 %
Neutro Abs: 4.9 10*3/uL (ref 1.7–7.7)
Neutrophils Relative %: 62 %
Platelets: 112 10*3/uL — ABNORMAL LOW (ref 150–400)
RBC: 2.88 MIL/uL — ABNORMAL LOW (ref 4.22–5.81)
RDW: 15.9 % — ABNORMAL HIGH (ref 11.5–15.5)
WBC: 8 10*3/uL (ref 4.0–10.5)
nRBC: 0 % (ref 0.0–0.2)

## 2021-12-25 LAB — COMPREHENSIVE METABOLIC PANEL
ALT: 21 U/L (ref 0–44)
AST: 27 U/L (ref 15–41)
Albumin: 3.1 g/dL — ABNORMAL LOW (ref 3.5–5.0)
Alkaline Phosphatase: 66 U/L (ref 38–126)
Anion gap: 14 (ref 5–15)
BUN: 25 mg/dL — ABNORMAL HIGH (ref 8–23)
CO2: 28 mmol/L (ref 22–32)
Calcium: 8.7 mg/dL — ABNORMAL LOW (ref 8.9–10.3)
Chloride: 94 mmol/L — ABNORMAL LOW (ref 98–111)
Creatinine, Ser: 5.61 mg/dL — ABNORMAL HIGH (ref 0.61–1.24)
GFR, Estimated: 9 mL/min — ABNORMAL LOW (ref >60)
Glucose, Bld: 122 mg/dL — ABNORMAL HIGH (ref 70–99)
Potassium: 5.5 mmol/L — ABNORMAL HIGH (ref 3.5–5.1)
Sodium: 136 mmol/L (ref 135–145)
Total Bilirubin: 0.6 mg/dL (ref 0.3–1.2)
Total Protein: 5.7 g/dL — ABNORMAL LOW (ref 6.5–8.1)

## 2021-12-25 LAB — PHOSPHORUS: Phosphorus: 4.8 mg/dL — ABNORMAL HIGH (ref 2.5–4.6)

## 2021-12-25 LAB — HEPATITIS B SURFACE ANTIBODY, QUANTITATIVE: Hep B S AB Quant (Post): 10.7 m[IU]/mL (ref >9.9)

## 2021-12-25 LAB — GLUCOSE, CAPILLARY
Glucose-Capillary: 150 mg/dL — ABNORMAL HIGH (ref 70–99)
Glucose-Capillary: 114 mg/dL — ABNORMAL HIGH (ref 70–99)

## 2021-12-25 LAB — MAGNESIUM: Magnesium: 1.6 mg/dL — ABNORMAL LOW (ref 1.7–2.4)

## 2021-12-25 MED ORDER — CHLORHEXIDINE GLUCONATE CLOTH 2 % EX PADS: 6.0000 | MEDICATED_PAD | Freq: Every day | CUTANEOUS | Status: DC

## 2021-12-25 NOTE — Evaluation (Signed)
Physical Therapy Evaluation Patient Details Name: Rodney Farley MRN: [id number] DOB: Oct 17, 1931 Today's Date: 12/25/2021  History of Present Illness  88 y/o male presented to ED on 12/23/21 for chest pain radiating to bilateral arms. S/p L heart cath and coronary angiography on 6/14. Recent L heart cath on 11/11/21. PMH: CAD s/p PCA with stent placements, NSTEMI, complete heart block s/p pacemaker, HTN, T2DM, OSA, COPD, TIA  Clinical Impression  Patient admitted with the above. PTA, patient lives alone and uses RW for mobility. Denies falls but reports numerous stumbles into door frames and furniture. Patient presents with weakness, decreased activity tolerance, impaired balance, and decreased safety awareness. Patient required up to maxA during posterior LOB in room and sudden drifting to R with flexing knees for unknown reason. Patient states this occurs at least twice a week. Patient has son and daughter in law close by. Encouraged and educated patient on need for supervision initially for safety as patient is at high risk for falls. Patient will benefit from skilled PT services during acute stay to address listed deficits. Recommend HHPT at discharge to maximize functional mobility and safety and reduce fall risk.        Recommendations for follow up therapy are one component of a multi-disciplinary discharge planning process, led by the attending physician.  Recommendations may be updated based on patient status, additional functional criteria and insurance authorization.  Follow Up Recommendations Home health PT    Assistance Recommended at Discharge Frequent or constant Supervision/Assistance  Patient can return home with the following  A little help with walking and/or transfers;A little help with bathing/dressing/bathroom;Assistance with cooking/housework;Direct supervision/assist for medications management;Direct supervision/assist for financial management;Assist for  transportation;Help with stairs or ramp for entrance    Equipment Recommendations None recommended by PT  Recommendations for Other Services       Functional Status Assessment Patient has had a recent decline in their functional status and demonstrates the ability to make significant improvements in function in a reasonable and predictable amount of time.     Precautions / Restrictions Precautions Precautions: Fall Restrictions Weight Bearing Restrictions: No      Mobility  Bed Mobility               General bed mobility comments: sitting EOB on arrival    Transfers Overall transfer level: Needs assistance Equipment used: Rolling Laurali Goddard (2 wheels) Transfers: Sit to/from Stand Sit to Stand: Min guard           General transfer comment: min guard for safety    Ambulation/Gait Ambulation/Gait assistance: Min guard, Max assist Gait Distance (Feet): 300 Feet Assistive device: Rolling Ferguson Gertner (2 wheels) Gait Pattern/deviations: Step-through pattern, Decreased stride length, Drifts right/left Gait velocity: decreased Gait velocity interpretation: >2.62 ft/sec, indicative of community ambulatory   General Gait Details: patient ambulating in room with RW and reached for bathroom door then had an episode of posterior LOB with jerking movements requiring maxA to recover. Min guard throughout hallway ambulation except one instance of patient drifting to R outside of RW and knees flexing for unknown reason requiring maxA to maintain balance. Patient aware of situation but unable to provide information on why it happened  Stairs            Wheelchair Mobility    Modified Rankin (Stroke Patients Only)       Balance Overall balance assessment: Needs assistance, History of Falls Sitting-balance support: No upper extremity supported, Feet supported Sitting balance-Leahy Scale: Fair  Standing balance support: Bilateral upper extremity supported, Reliant on  assistive device for balance Standing balance-Leahy Scale: Poor Standing balance comment: reliant on RW                             Pertinent Vitals/Pain Pain Assessment Pain Assessment: No/denies pain    Home Living Family/patient expects to be discharged to:: Private residence Living Arrangements: Alone Available Help at Discharge: Family;Available PRN/intermittently Type of Home: House Home Access: Level entry       Home Layout: One level Home Equipment: Conservation officer, nature (2 wheels);Rollator (4 wheels);Shower seat;Grab bars - tub/shower      Prior Function Prior Level of Function : Independent/Modified Independent;History of Falls (last six months)             Mobility Comments: patient uses RW or rollator for mobility. Denies falls but reports numerous near falls resulting in falling into door frames and furniture but "never hit the ground"       Hand Dominance        Extremity/Trunk Assessment   Upper Extremity Assessment Upper Extremity Assessment: Generalized weakness    Lower Extremity Assessment Lower Extremity Assessment: Generalized weakness    Cervical / Trunk Assessment Cervical / Trunk Assessment: Kyphotic  Communication   Communication: No difficulties  Cognition Arousal/Alertness: Awake/alert Behavior During Therapy: WFL for tasks assessed/performed Overall Cognitive Status: No family/caregiver present to determine baseline cognitive functioning                                 General Comments: STM deficits and decreased awareness of deficits and safety        General Comments      Exercises     Assessment/Plan    PT Assessment Patient needs continued PT services  PT Problem List Decreased strength;Decreased activity tolerance;Decreased balance;Decreased mobility;Decreased cognition;Decreased safety awareness;Cardiopulmonary status limiting activity       PT Treatment Interventions DME instruction;Gait  training;Functional mobility training;Therapeutic activities;Therapeutic exercise;Balance training;Patient/family education    PT Goals (Current goals can be found in the Care Plan section)  Acute Rehab PT Goals Patient Stated Goal: to stop stumbling PT Goal Formulation: With patient Time For Goal Achievement: 01/08/22 Potential to Achieve Goals: Good    Frequency Min 3X/week     Co-evaluation               AM-PAC PT "6 Clicks" Mobility  Outcome Measure Help needed turning from your back to your side while in a flat bed without using bedrails?: A Little Help needed moving from lying on your back to sitting on the side of a flat bed without using bedrails?: A Little Help needed moving to and from a bed to a chair (including a wheelchair)?: A Little Help needed standing up from a chair using your arms (e.g., wheelchair or bedside chair)?: A Little Help needed to walk in hospital room?: A Lot Help needed climbing 3-5 steps with a railing? : A Lot 6 Click Score: 16    End of Session   Activity Tolerance: Patient tolerated treatment well Patient left: in bed;with call bell/phone within reach;with nursing/sitter in room Nurse Communication: Mobility status PT Visit Diagnosis: Unsteadiness on feet (R26.81);Muscle weakness (generalized) (M62.81);History of falling (Z91.81);Other abnormalities of gait and mobility (R26.89)    Time: 7253-6644 PT Time Calculation (min) (ACUTE ONLY): 25 min   Charges:   PT Evaluation $PT  Eval Moderate Complexity: 1 Mod PT Treatments $Therapeutic Activity: 8-22 mins        Ahmad Vanwey A. Gilford Rile PT, DPT Acute Rehabilitation Services Office [phone]   Linna Hoff 12/25/2021, 12:04 PM

## 2021-12-25 NOTE — Discharge Summary (Signed)
Physician Discharge Summary  Rodney Farley GPQ:[id number] DOB: 06/25/1932 DOA: 12/23/2021  PCP: [name], MD  Admit date: 12/23/2021 Discharge date: 12/25/2021  Time spent: 40 minutes  Recommendations for Outpatient Follow-up:  Follow outpatient CBC/CMP  Follow with cardiology outpatient Follow lipids outpatient, goal LDL<55 per cardiology Follow A1c outpatient Follow blood pressure outpatient  Attention to platelets outpatient    Discharge Diagnoses:  Principal Problem:   NSTEMI (non-ST elevated myocardial infarction) ([place]) Active Problems:   Hyperkalemia, diminished renal excretion   ESRD on hemodialysis ([place])   Secondary hyperparathyroidism of renal origin ([place])   Hypotension   Aortic stenosis   Heart failure with mid-range ejection fraction (HFmEF) (HCC)   Depression   Hyperlipidemia   Thrombocytopenia (HCC)   Anemia in chronic kidney disease   Type II diabetes mellitus ([place])   Obesity (BMI 30-39.9)   Ischemic cardiomyopathy   Pacemaker   GERD (gastroesophageal reflux disease)   Non-ST elevation (NSTEMI) myocardial infarction [facility])   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   Secondary hypoparathyroidism [facility])   Discharge Condition: stable  Diet recommendation: heart healthy, renal  Filed Weights   12/24/21 0101 12/24/21 1634  Weight: 94.4 kg 98.6 kg    History of present illness:   Rodney Farley is Rodney Farley 88 y.o. male with medical history significant of CAD status post multiple PCI with stent placement's, NSTEMI, complete heart block status post PPM, ischemic cardiomyopathy status post AICD, moderate aortic stenosis, hypertension, hyperlipidemia, diabetes mellitus type 2, OSA, COPD, TIA, BPH.  He had recent LHC s/p angioplasty and DES to ostial/proximal LAD.  He was readmitted with an NSTEMI.  Now s/p cath and PCI by cardiology.  Stable for discharge with recommendation for indefinite DAPT, medical therapy for CAD, aggressive  lipid lowering therapy.  See below for additional details    Hospital Course:  Assessment and Plan: * NSTEMI (non-ST elevated myocardial infarction) ([place]) Troponin as high as 1073  S/p cath, due to focal 90% proximal in stent restenosis with thrombus and probable stent underexpansion, mild residual nonobstructive cad in L circumflex vessel with 30/40, and 50% stenoses.  Previously placed stent in OM3 vessel difficult to visualize.  No significant change in previous 70% RCA stenosis in nondominant RCA vessel.  OCT demonstrating prior Lad stent underexpansion ostially with calcification and plaque.  Difficult but successful PCI to ostium proximal LAD stent treated with PTCA with Rodney Farley 2.5 x 79m, 3 x 12 mm compliant balloon, 3.5 and 4 x 15 mm non compliant balloons. Cards recommending DAPT indefinitely, medical therapy for CAD Aggressive lipid lowering therapy with target LDL <55 Stable for discharge today per cards, indefinite DAPT, continue statin, coreg d/c'd due to hypotension  Hyperkalemia, diminished renal excretion Follow with dialysis  Hypotension On dialysis, continue midodrine MWF  ESRD on hemodialysis ([facility] MWF dialysis Per renal, will follow  Aortic stenosis Moderate AS noted 11/2021 Follow with cards outpatient  Heart failure with mid-range ejection fraction (HFmEF) (HCC) Volume per renal  Echo 11/2021 with EF 45-50%  Depression No longer on remeron or zoloft  Hyperlipidemia lipitor 80 mg  Anemia in chronic kidney disease trend  Thrombocytopenia ([place] Trend  Type II diabetes mellitus ([place] SSI A1c 11/2021 7.5 Not on any meds outpatient  Obesity (BMI 30-39.9) noted      Procedures: LHC NSTEMI secondary to focal 90% proximal in-stent restenosis with thrombus and probable stent underexpansion.   Mild residual nonobstructive CAD in the left circumflex vessel with 30, 40 and 50%  stenoses.  Rodney Farley previously placed stent in the OM3 vessel was difficult to  visualize.   No significant change in the previous 70% proximal RCA stenosis in nondominant RCA vessel.   OCT demonstrating prior LAD stent underexpansion ostially with calcification and plaque.   Difficult but successful PCI to the ostium proximal LAD stent treated with PTCA with Rodney Farley 2.5 x 12 mm, 3.0 x 12 mm compliant balloon, 3.5 and 4.0 x 15 mm noncompliant balloons.   RECOMMENDATION: DAPT indefinitely.  Medical therapy for concomitant CAD.  The patient is on dialysis on Monday, Wednesday, and Fridays.  Aggressive lipid-lowering therapy with target LDL less than 55.   Consultations: Cardiology nephrology  Discharge Exam: Vitals:   12/25/21 0546 12/25/21 1142  BP: (!) 98/48 (!) 102/41  Pulse: 72 77  Resp: 18 18  Temp: 98.1 F (36.7 C) 97.6 F (36.4 C)  SpO2: 96% 96%   Eager to discharge No new complaints Daughter at bedside  General: No acute distress. Cardiovascular: Heart sounds show Rodney Farley regular rate, and rhythm. Systolic murmur. Lungs: unlabored Abdomen: Soft, nontender, nondistended  Neurological: Alert and oriented 3. Moves all extremities 4 with equal strength. Cranial nerves II through XII grossly intact. Extremities: No clubbing or cyanosis. No edema.   Discharge Instructions   Discharge Instructions     Amb Referral to Cardiac Rehabilitation   Complete by: As directed    Diagnosis: PTCA   After initial evaluation and assessments completed: Virtual Based Care may be provided alone or in conjunction with Phase 2 Cardiac Rehab based on patient barriers.: Yes   Call MD for:  difficulty breathing, headache or visual disturbances   Complete by: As directed    Call MD for:  extreme fatigue   Complete by: As directed    Call MD for:  hives   Complete by: As directed    Call MD for:  persistant dizziness or light-headedness   Complete by: As directed    Call MD for:  persistant nausea and vomiting   Complete by: As directed    Call MD for:  redness, tenderness,  or signs of infection (pain, swelling, redness, odor or green/yellow discharge around incision site)   Complete by: As directed    Call MD for:  severe uncontrolled pain   Complete by: As directed    Call MD for:  temperature >100.4   Complete by: As directed    Diet - low sodium heart healthy   Complete by: As directed    Discharge instructions   Complete by: As directed    You were seen for an NSTEMI (heart attack).  You had Vicent Febles cardiac intervention/catheterization by cardiology.  Cardiology recommends continuing your aspirin and plavix indefinitely.  Continue your lipitor.  Stop your coreg due to your low blood pressures.   Please follow with cardiology outpatient.  You'll need follow up echos for your aortic stenosis.  Return for new, recurrent, or worsening symptoms.  Please ask your PCP to request records from this hospitalization so they know what was done and what the next steps will be.   Increase activity slowly   Complete by: As directed       Allergies as of 12/25/2021       Reactions   Bee Venom Anaphylaxis   Lyrica [pregabalin] Other (See Comments)   hallucinations   Prednisone Other (See Comments)   hallucinations   Zocor [simvastatin] Nausea Only, Other (See Comments)   Headache with brand name only.  Can take  the generic.        Medication List     STOP taking these medications    carvedilol 3.125 MG tablet Commonly known as: COREG   mirtazapine 7.5 MG tablet Commonly known as: REMERON   sertraline 100 MG tablet Commonly known as: ZOLOFT       TAKE these medications    aspirin EC 81 MG tablet Take 81 mg by mouth daily.   atorvastatin 80 MG tablet Commonly known as: LIPITOR Take 1 tablet (80 mg total) by mouth daily. What changed: when to take this   calcitRIOL 0.25 MCG capsule Commonly known as: ROCALTROL Take 7 capsules (1.75 mcg total) by mouth every Monday, Wednesday, and Friday with hemodialysis.   carbamide peroxide 6.5 % OTIC  solution Commonly known as: DEBROX Place 5 drops into both ears 2 (two) times daily as needed (earwax).   clopidogrel 75 MG tablet Commonly known as: PLAVIX Take 1 tablet (75 mg total) by mouth daily with breakfast.   erythromycin ophthalmic ointment Place 1 application into the left eye at bedtime.   FREESTYLE TEST STRIPS test strip Generic drug: glucose blood Use to check blood sugar daily   gabapentin 300 MG capsule Commonly known as: NEURONTIN Take 1 capsule (300 mg total) by mouth 2 (two) times daily.   glucose monitoring kit monitoring kit USE TO MONITOR BLOOD GLUCOSE AS DIRECTED   lidocaine 5 % ointment Commonly known as: XYLOCAINE Apply 1 application topically as needed.   LUBRICATING EYE DROPS OP Place 1 drop into both eyes 3 (three) times daily as needed (dry eyes).   midodrine 10 MG tablet Commonly known as: PROAMATINE Take 1 tablet (10 mg) three times Elanor Cale week. Add 5 mg Marquisa Salih night after dialysis as needed for low blood pressure. What changed:  how much to take how to take this when to take this   MIRCERA IJ as directed.   multivitamin Tabs tablet Take 1 tablet by mouth daily.   nitroGLYCERIN 0.4 MG SL tablet Commonly known as: NITROSTAT Place 1 tablet (0.4 mg total) under the tongue every 5 (five) minutes x 3 doses as needed for chest pain.   oxymetazoline 0.05 % nasal spray Commonly known as: AFRIN Place 1 spray into both nostrils 2 (two) times daily as needed for congestion.   pantoprazole 40 MG tablet Commonly known as: PROTONIX Take 1 tablet (40 mg total) by mouth daily.   saccharomyces boulardii 250 MG capsule Commonly known as: FLORASTOR Take 1 capsule (250 mg total) by mouth 2 (two) times daily.   sevelamer carbonate 800 MG tablet Commonly known as: RENVELA Take 2 tablets (1,600 mg total) by mouth 3 (three) times daily with meals.   silver sulfADIAZINE 1 % cream Commonly known as: Silvadene Apply pea-sized amount to wound daily.    triamcinolone cream 0.1 % Commonly known as: KENALOG Apply 1 application topically as directed.   vitamin B-12 1000 MCG tablet Commonly known as: CYANOCOBALAMIN Take 1,000 mcg by mouth daily.       Allergies  Allergen Reactions   Bee Venom Anaphylaxis   Lyrica [Pregabalin] Other (See Comments)    hallucinations   Prednisone Other (See Comments)    hallucinations   Zocor [Simvastatin] Nausea Only and Other (See Comments)    Headache with brand name only.  Can take the generic.    Follow-up Information     Darreld Mclean, PA-C Follow up.   Specialties: Physician Assistant, Cardiology Why: Hospital follow-up with Cardiology scheduled for 01/01/2022  at 2:45pm. Please arrive 15 minute early for check-in. If this date/time does not work for you, please call our office tor reschedule. Contact information: [address] [place] [address] [phone]                  The results of significant diagnostics from this hospitalization (including imaging, microbiology, ancillary and laboratory) are listed below for reference.    Significant Diagnostic Studies: CARDIAC CATHETERIZATION  Result Date: 12/24/2021   Dist LM to Ost LAD lesion is 90% stenosed.   Prox Cx lesion is 30% stenosed.   Mid Cx to Dist Cx lesion is 40% stenosed.   3rd Mrg lesion is 50% stenosed.   Mid Cx lesion is 30% stenosed.   Ost RCA to Prox RCA lesion is 70% stenosed.   Previously placed 2nd Diag stent of unknown type is  widely patent.   Post intervention, there is Meg Niemeier 0% residual stenosis. NSTEMI secondary to focal 90% proximal in-stent restenosis with thrombus and probable stent underexpansion. Mild residual nonobstructive CAD in the left circumflex vessel with 30, 40 and 50% stenoses.  Wynter Grave previously placed stent in the OM3 vessel was difficult to visualize. No significant change in the previous 70% proximal RCA stenosis in nondominant RCA vessel. OCT demonstrating prior LAD stent  underexpansion ostially with calcification and plaque. Difficult but successful PCI to the ostium proximal LAD stent treated with PTCA with Zarius Furr 2.5 x 12 mm, 3.0 x 12 mm compliant balloon, 3.5 and 4.0 x 15 mm noncompliant balloons. RECOMMENDATION: DAPT indefinitely.  Medical therapy for concomitant CAD.  The patient is on dialysis on Monday, Wednesday, and Fridays.  Aggressive lipid-lowering therapy with target LDL less than 55.   DG Chest 2 View  Result Date: 12/23/2021 CLINICAL DATA:  Chest pain beginning this morning. Nausea and vomiting. EXAM: CHEST - 2 VIEW COMPARISON:  11/11/2021 FINDINGS: Heart size remains at the upper limits of normal. Pacemaker again seen in expected position. Both lungs are clear. IMPRESSION: Stable exam. No active lung disease. Electronically Signed   By: Marlaine Hind M.D.   On: 12/23/2021 11:51    Microbiology: No results found for this or any previous visit (from the past 240 hour(s)).   Labs: Basic Metabolic Panel: Recent Labs  Lab 12/23/21 1320 12/23/21 1944 12/24/21 0144 12/25/21 0348  NA 137  --  140 136  K 6.2* 5.5* 5.4* 5.5*  CL 93*  --  94* 94*  CO2 27  --  32 28  GLUCOSE 226*  --  181* 122*  BUN 41*  --  48* 25*  CREATININE 7.95*  --  8.94* 5.61*  CALCIUM 9.9  --  9.7 8.7*  MG  --   --   --  1.6*  PHOS  --   --  7.3* 4.8*   Liver Function Tests: Recent Labs  Lab 12/24/21 0144 12/25/21 0348  AST  --  27  ALT  --  21  ALKPHOS  --  66  BILITOT  --  0.6  PROT  --  5.7*  ALBUMIN 3.7 3.1*   No results for input(s): "LIPASE", "AMYLASE" in the last 168 hours. No results for input(s): "AMMONIA" in the last 168 hours. CBC: Recent Labs  Lab 12/23/21 1122 12/24/21 0427 12/25/21 0348  WBC 7.5 9.2 8.0  NEUTROABS  --   --  4.9  HGB 12.5* 12.3* 10.0*  HCT 38.1* 37.2* 30.8*  MCV 108.2* 106.9* 106.9*  PLT 108* 106* 112*  Cardiac Enzymes: No results for input(s): "CKTOTAL", "CKMB", "CKMBINDEX", "TROPONINI" in the last 168 hours. BNP: BNP  (last 3 results) No results for input(s): "BNP" in the last 8760 hours.  ProBNP (last 3 results) No results for input(s): "PROBNP" in the last 8760 hours.  CBG: Recent Labs  Lab 12/24/21 0644 12/24/21 1726 12/24/21 2151 12/25/21 0818 12/25/21 1140  GLUCAP 138* 106* 87 150* 114*       Signed:  Fayrene Helper MD.  [facility] 12/25/2021, 1:28 PM

## 2021-12-25 NOTE — Progress Notes (Signed)
CARDIAC REHAB PHASE I   PRE:  Rate/Rhythm: 71 paced  BP:  Sitting: 81/41      MODE:  Ambulation: 270 ft   POST:  Rate/Rhythm: 95 paced  BP:  Sitting: 136/48    Pt agreeable to ambulation. Pt ambulated 282ft in hallway assist of one with front wheel walker. Pt wobbly on rising. States he regularly uses a walker, has standard walker and rollator at home. Reinforced importance of ASA and Plavix. Reviewed site care, restrictions, and exercise guidelines. Will re-refer to [facility] [place], pt very interested in attending.  [id number] Rufina Falco, RN BSN 12/25/2021 9:20 AM

## 2021-12-25 NOTE — Progress Notes (Unsigned)
Cardiology Office Note:    Date:  01/01/2022   ID:  Ahmaad, Neidhardt Sep 22, 1931, MRN [id number]  PCP:  [name], MD  Cardiologist:  Shameek [name], MD  Electrophysiologist:  None   Referring MD: [name], MD   Chief Complaint: hospital follow-up of NSTEMI  History of Present Illness:    Rodney Farley is a 88 y.o. male with a history of CAD with remote MI in 68 while in [place], PCIs to LAD/ 2nd Diag/ OM3 in 2016, and recent NSTEMI on 11/11/2021 s/p DES to ostial to LAD; ischemic cardiomyopathy/ chronic systolic CHF with EF as low as 25-30% in 2016 with subsequent normalization and then EF of 45-50% on most recent Echo on 11/11/2021; complete heart block s/p PPM which was upgraded to ICD-CRT in 2017 (gen change in 09/2021); subclinical atrial fibrillation noted on device in 2021 not on anticoagulation, moderate aortic stenosis, hypertension; hyperlipidemia; type 2 diabetes mellitus; ESRD on hemodialysis; obstructive sleep apnea; COPD; TIA; and BPH who is followed by Dr. [name] and Dr. Caryl Comes and presents today for hospital follow-up of NSTEMI.  Patient was recently admitted from 11/11/2021 to 11/13/2021 for NSTEMI after presenting with sharp substernal chest pain radiating to the left upper extremity with associated nausea, diaphoresis, and dyspnea. High-sensitivity troponin peaked at 4,677. Echo showed LVEF of 45-50% with akinesis of the apical septal segment, apical inferior segment, and apex as well as grade 1 diastolic dysfunction and moderate aortic stenosis. LHC on 11/12/2021 90% stenosis of ostial to proximal LAD, 90% stenosis of ostial 1st Diag, and 70% stenosis of ostial to proximal RCA, and then mild disease in OM3, 2nd Diag, and LPAV. He underwent successful PCI with DES to ostial to proximal LAD lesion. He was started on DAPT with Aspirin and Plavix and home Lipitor was increased.  Patient was seen by me on 12/03/2021 at which time he was doing well with no  recurrent chest pain. He did report problems with his BP dropping on dialysis even with taking Midodrine $RemoveBeforeDE'10mg'JVRlgyssZzoXQgX$  on those mornings. His Coreg was decreased and he was advised that he could take an additional $RemoveBefor'5mg'KRjmzjjfcTeS$  of Midodrine on the evenings of dialysis as needed for low BP.  Since last visit, he was recently admitted from 12/23/2021 to 12/25/2021 for NSTEMI after presenting with chest pain that radiated to bilateral arms with associated nausea and vomiting. Pain improved with Nitro. High-sensitivity troponin 223 >> 653 >> 1,073. Repeat LHC which showed 90% in-stent restenosis of distal left main to ostial LAD with thrombus and probable stent underexpansion. Patient underwent difficult but successful PCI with PCTA of this lesion. He was continued on DAPT but Coreg was stopped due to Coreg.  Patient presents today for follow-up. Here alone.  Patient is doing well since most recent discharge.  He denies any recurrent chest pain.  He has some chronic shortness of breath with activity and lower extremity edema when he is nearing dialysis session but improves after dialysis.  No significant shortness of breath at rest.  No palpitations, lightheadedness, dizziness, syncope.  His BP continues to be soft and his Nephrologist stated that he could take an extra half of his Midodrine on nondialysis days if needed.  I agree with this.  Of note, patient still has the gauze over his right femoral cath site.  I removed this in the office today and site started to ooze slightly from where the top layer skin was pulled off with the gauze.  I applied pressure  and oozing quickly stopped.  I reapplied a band-aid. Otherwise, cath site looks OK - he has some residual ecchymosis but cath site soft with no signs of hematoma.  Past Medical History:  Diagnosis Date   AICD (automatic cardioverter/defibrillator) present    Anemia    Anxiety    Arthritis    Atrioventricular block, complete (HCC)    a. 2010 s/p pacemaker.   Bell's  palsy    left side. after shingles episode   Bilateral renal cysts 07/23/2017   Simple and hemorrhagic noted on CT ab/pelvis    BPH associated with nocturia    Chronic systolic CHF (congestive heart failure) (HCC)    EF normalized by Echo 2019   COPD (chronic obstructive pulmonary disease) (HCC)    Severe   Coronary artery disease    a. s/p MI in 1994/1995 while in [place] s/p questionable PCI. 03/2015: progression of disease, for staged PCI.   Depression    Diabetic peripheral neuropathy (HCC)    Diverticulitis    Full dentures    Gallstones    GERD (gastroesophageal reflux disease)    Gout    Hard of hearing    B/L   History of chronic pancreatitis 07/23/2017   noted on CT abd/pelvis   History of shingles    Hypercholesterolemia    Hypertension    Ischemic cardiomyopathy    MI (myocardial infarction) (HCC) 1994; 1995   Neuropathy    IN LOWER EXTREMITIES   Nosebleed 10/06/2017   for 2 months most recent 10/06/2017   Obesity    Pacemaker    medtronic>>> MDT ICD 09/23/15   Ruptured appendicitis    Sleep apnea    "sleeps w/humidifyer when he panics and gets short of breath" (04/08/2015)   TIA (transient ischemic attack) X 3   Trigger middle finger of left hand    Type II diabetes mellitus (HCC)    Wears glasses    Wears hearing aid     Past Surgical History:  Procedure Laterality Date   APPENDECTOMY     AV FISTULA PLACEMENT Right 02/07/2019   Procedure: ARTERIOVENOUS (AV) FISTULA CREATION RIGHT UPPER ARM;  Surgeon: Maeola Harman, MD;  Location: [facility];  Service: Vascular;  Laterality: Right;   BIV ICD GENERATOR CHANGEOUT N/A 10/09/2021   Procedure: BIV ICD GENERATOR CHANGEOUT;  Surgeon: Duke Salvia, MD;  Location: [facility] INVASIVE CV LAB;  Service: Cardiovascular;  Laterality: N/A;   CARDIAC CATHETERIZATION N/A 03/29/2015   Procedure: Right/Left Heart Cath and Coronary Angiography;  Surgeon: Sanjit M Swaziland, MD;  Location: [facility] INVASIVE CV LAB;  Service:  Cardiovascular;  Laterality: N/A;   CARDIAC CATHETERIZATION  1995   "after my MI; put me on heart RX after cath"   CARDIAC CATHETERIZATION N/A 04/09/2015   Procedure: Coronary Stent Intervention;  Surgeon: Merl M Swaziland, MD;  Location: [facility] INVASIVE CV LAB;  Service: Cardiovascular;  Laterality: N/A;   CATARACT EXTRACTION W/ INTRAOCULAR LENS  IMPLANT, BILATERAL     CHOLECYSTECTOMY N/A 10/13/2017   Procedure: LAPAROSCOPIC CHOLECYSTECTOMY WITH LYSIS OF ADHESIONS;  Surgeon: Andria Meuse, MD;  Location: [facility] ORS;  Service: General;  Laterality: N/A;   COLONOSCOPY     CORONARY ANGIOGRAPHY N/A 11/12/2021   Procedure: CORONARY ANGIOGRAPHY;  Surgeon: Iran Ouch, MD;  Location: [facility] INVASIVE CV LAB;  Service: Cardiovascular;  Laterality: N/A;   CORONARY ANGIOGRAPHY N/A 12/24/2021   Procedure: CORONARY ANGIOGRAPHY;  Surgeon: Lennette Bihari, MD;  Location: [facility] INVASIVE CV LAB;  Service: Cardiovascular;  Laterality: N/A;   CORONARY BALLOON ANGIOPLASTY N/A 12/24/2021   Procedure: CORONARY BALLOON ANGIOPLASTY;  Surgeon: Troy Sine, MD;  Location: [place] CV LAB;  Service: Cardiovascular;  Laterality: N/A;   CORONARY STENT INTERVENTION N/A 11/12/2021   Procedure: CORONARY STENT INTERVENTION;  Surgeon: Wellington Hampshire, MD;  Location: [place] CV LAB;  Service: Cardiovascular;  Laterality: N/A;  LAD   DENTAL SURGERY     EP IMPLANTABLE DEVICE N/A 09/23/2015   MDT CRT-D, Dr. Caryl Comes   HIATAL HERNIA REPAIR  1977   ILEOCECETOMY N/A 03/27/2017   Procedure: ILEOCECECTOMY;  Surgeon: Ileana Roup, MD;  Location: [place];  Service: General;  Laterality: N/A;   INSERT / REPLACE / REMOVE PACEMAKER  07/2008   Complete heart block status post DDD with good function   INTRAVASCULAR IMAGING/OCT N/A 12/24/2021   Procedure: INTRAVASCULAR IMAGING/OCT;  Surgeon: Troy Sine, MD;  Location: [place] CV LAB;  Service: Cardiovascular;  Laterality: N/A;   IR FLUORO GUIDE CV LINE RIGHT  04/03/2019   IR US  GUIDE VASC ACCESS RIGHT  04/03/2019   LAPAROTOMY N/A 03/27/2017   Procedure: EXPLORATORY LAPAROTOMY;  Surgeon: Ileana Roup, MD;  Location: [place];  Service: General;  Laterality: N/A;   TONSILLECTOMY     UPPER GI ENDOSCOPY      Current Medications: Current Meds  Medication Sig   aspirin EC 81 MG tablet Take 81 mg by mouth daily.   atorvastatin (LIPITOR) 80 MG tablet Take 1 tablet (80 mg total) by mouth daily. (Patient taking differently: Take 80 mg by mouth at bedtime.)   calcitRIOL (ROCALTROL) 0.25 MCG capsule Take 7 capsules (1.75 mcg total) by mouth every Monday, Wednesday, and Friday with hemodialysis.   carbamide peroxide (DEBROX) 6.5 % OTIC solution Place 5 drops into both ears 2 (two) times daily as needed (earwax).   Carboxymethylcellul-Glycerin (LUBRICATING EYE DROPS OP) Place 1 drop into both eyes 3 (three) times daily as needed (dry eyes).   clopidogrel (PLAVIX) 75 MG tablet Take 1 tablet (75 mg total) by mouth daily with breakfast.   erythromycin ophthalmic ointment Place 1 application into the left eye at bedtime.   gabapentin (NEURONTIN) 300 MG capsule Take 1 capsule (300 mg total) by mouth 2 (two) times daily.   glucose blood (FREESTYLE TEST STRIPS) test strip Use to check blood sugar daily   glucose monitoring kit (FREESTYLE) monitoring kit USE TO MONITOR BLOOD GLUCOSE AS DIRECTED   lidocaine (XYLOCAINE) 5 % ointment Apply 1 application topically as needed.   Methoxy PEG-Epoetin Beta (MIRCERA IJ) as directed.   midodrine (PROAMATINE) 10 MG tablet Take 1 tablet (10 mg) three times a week. Add 5 mg a night after dialysis as needed for low blood pressure. (Patient taking differently: Take 5-10 mg by mouth See admin instructions. Take 1 tablet (10 mg) three times a week. Add 5 mg a night after dialysis as needed for low blood pressure.)   multivitamin (RENA-VIT) TABS tablet Take 1 tablet by mouth daily.   nitroGLYCERIN (NITROSTAT) 0.4 MG SL tablet Place 1 tablet (0.4 mg  total) under the tongue every 5 (five) minutes x 3 doses as needed for chest pain.   oxymetazoline (AFRIN) 0.05 % nasal spray Place 1 spray into both nostrils 2 (two) times daily as needed for congestion.   pantoprazole (PROTONIX) 40 MG tablet Take 1 tablet (40 mg total) by mouth daily.   saccharomyces boulardii (FLORASTOR) 250 MG capsule Take 1 capsule (250  mg total) by mouth 2 (two) times daily.   sevelamer carbonate (RENVELA) 800 MG tablet Take 2 tablets (1,600 mg total) by mouth 3 (three) times daily with meals.   silver sulfADIAZINE (SILVADENE) 1 % cream Apply pea-sized amount to wound daily.   triamcinolone cream (KENALOG) 0.1 % Apply 1 application topically as directed.   vitamin B-12 (CYANOCOBALAMIN) 1000 MCG tablet Take 1,000 mcg by mouth daily.     Allergies:   Bee venom, Lyrica [pregabalin], Prednisone, and Zocor [simvastatin]   Social History   Socioeconomic History   Marital status: Legally Separated    Spouse name: Not on file   Number of children: 1   Years of education: college   Highest education level: Not on file  Occupational History   Occupation: Retired  Tobacco Use   Smoking status: Former    Packs/day: 1.50    Years: 54.00    Total pack years: 81.00    Types: Cigarettes    Quit date: 07/18/2007    Years since quitting: 14.4    Passive exposure: Never   Smokeless tobacco: Never  Vaping Use   Vaping Use: Never used  Substance and Sexual Activity   Alcohol use: Yes    Comment: rare   Drug use: No   Sexual activity: Never  Other Topics Concern   Not on file  Social History Narrative   Married 1994 (together since 1989) 1 son, 1 stepson. 3 grandkids, 6 great grandkids      Retired from [employer]. Had 2 years of collge.       Faith: Mormon      Here on [address] since 2004 from [place]; wife recently left and relocated to [place]    Social Determinants of Health   Financial Resource Strain: Not on [employer] Insecurity: Not on file   Transportation Needs: Not on file  Physical Activity: Not on file  Stress: Not on file  Social Connections: Not on file     Family History: [name] family history includes Heart attack in his brother; Leukemia in his father; Stroke in his mother and sister.  ROS:   Please see the history of present illness.     EKGs/Labs/Other Studies Reviewed:    The following studies were reviewed today:  Echocardiogram 11/11/2021: Impressions:  1. Left ventricular ejection fraction, by estimation, is 45 to 50%. The  left ventricle has mildly decreased function. The left ventricle  demonstrates regional wall motion abnormalities (see scoring  diagram/findings for description). The left ventricular   internal cavity size was mildly dilated. There is mild left ventricular  hypertrophy. Left ventricular diastolic parameters are consistent with  Grade I diastolic dysfunction (impaired relaxation).   2. Right ventricular systolic function is normal. The right ventricular  size is normal. Tricuspid regurgitation signal is inadequate for assessing  PA pressure.   3. Left atrial size was moderately dilated.   4. The mitral valve is degenerative. Trivial mitral valve regurgitation.  No evidence of mitral stenosis.   5. The aortic valve was not well visualized. Aortic valve regurgitation  is not visualized. Moderate aortic valve stenosis. Vmax 2.9 m/s, MG  59mmHg, AVA 1.2 cm^2, DI 0.38. _______________   Coronary Stent Intervention 11/12/2021:   Ost 1st Diag to 1st Diag lesion is 90% stenosed.   Ost RCA to Prox RCA lesion is 70% stenosed.   3rd Mrg lesion is 10% stenosed.   2nd Diag-2 lesion is 10% stenosed.   Mid LAD lesion  is 20% stenosed.   1st Diag lesion is 30% stenosed.   2nd Diag-1 lesion is 40% stenosed.   LPAV lesion is 40% stenosed.   Ost LAD to Prox LAD lesion is 90% stenosed.   A drug-eluting stent was successfully placed using a SYNERGY XD 3.0X24.   Post intervention, there is  a 0% residual stenosis.   1.  Patent LAD, diagonal and OM stent with no significant restenosis.  Severe 90% stenosis in ostial/proximal LAD is a likely culprit for myocardial infarction.  70% stenosis in ostial right coronary artery but the vessel is nondominant. 2.  Successful angioplasty and drug-eluting stent placement to the ostial/proximal LAD. 3.  I could not cross the aortic valve due to significant iliac and aortic tortuosity and I elected not to use a straight wire given that the patient just had an echocardiogram showing moderate aortic stenosis.   Recommendations: Dual antiplatelet therapy for at least 12 months. Aggressive treatment of risk factors.   Diagnostic Dominance: Left Intervention     _______________  Left Cardiac Catheterization 12/24/2021:   Dist LM to Ost LAD lesion is 90% stenosed.   Prox Cx lesion is 30% stenosed.   Mid Cx to Dist Cx lesion is 40% stenosed.   3rd Mrg lesion is 50% stenosed.   Mid Cx lesion is 30% stenosed.   Ost RCA to Prox RCA lesion is 70% stenosed.   Previously placed 2nd Diag stent of unknown type is  widely patent.   Post intervention, there is a 0% residual stenosis.   NSTEMI secondary to focal 90% proximal in-stent restenosis with thrombus and probable stent underexpansion.   Mild residual nonobstructive CAD in the left circumflex vessel with 30, 40 and 50% stenoses.  A previously placed stent in the OM3 vessel was difficult to visualize.   No significant change in the previous 70% proximal RCA stenosis in nondominant RCA vessel.   OCT demonstrating prior LAD stent underexpansion ostially with calcification and plaque.   Difficult but successful PCI to the ostium proximal LAD stent treated with PTCA with a 2.5 x 12 mm, 3.0 x 12 mm compliant balloon, 3.5 and 4.0 x 15 mm noncompliant balloons.   Recommendation: DAPT indefinitely.  Medical therapy for concomitant CAD.  The patient is on dialysis on Monday, Wednesday, and Fridays.   Aggressive lipid-lowering therapy with target LDL less than 55.  Diagnostic Dominance: Right  Intervention     EKG:  EKG  ordered today. EKG personally reviewed and demonstrates ventricular paced rhythm, rate 85 beats minute, with no acute changes compared to prior tracing.  Recent Labs: 11/11/2021: TSH 1.400 12/25/2021: ALT 21; BUN 25; Creatinine, Ser 5.61; Hemoglobin 10.0; Magnesium 1.6; Platelets 112; Potassium 5.5; Sodium 136  Recent Lipid Panel    Component Value Date/Time   CHOL 146 11/12/2021 0554   TRIG 237 (H) 11/12/2021 0554   HDL 35 (L) 11/12/2021 0554   CHOLHDL 4.2 11/12/2021 0554   VLDL 47 (H) 11/12/2021 0554   LDLCALC 64 11/12/2021 0554   LDLDIRECT 60.0 04/13/2018 0926    Physical Exam:    Vital Signs: BP (!) 96/42 (BP Location: Left Arm)   Pulse 85   Ht _0  (1.702 m)   Wt 215 lb 6.4 oz (97.7 kg)   SpO2 93%   BMI 33.74 kg/m     Wt Readings from Last 3 Encounters:  01/01/22 215 lb 6.4 oz (97.7 kg)  12/24/21 217 lb 6 oz (98.6 kg)  12/03/21 221 lb 3.2 oz (  100.3 kg)     General: 88 y.o. Caucasian male in no acute distress. HEENT: Normocephalic and atraumatic. Sclera clear.  Neck: Supple. No JVD. Heart: RRR. III/VI systolic murmur best heard at upper sternal border. Ecchymosis at right femoral cath site but soft with no signs of hematoma. Patient still had gauze on from cath on 12/24/2021 - removed gauze and when I did this it looks like the top layer of skin came off as well. Cath site began too ooze slightly but quickly stopped after applying pressure. See image below.  Lungs: No increased work of breathing. Clear to ausculation bilaterally. No wheezes, rhonchi, or rales.  Abdomen: Soft, non-distended, and non-tender to palpation.  Extremities: No lower extremity edema.    Skin: Warm and dry. Neuro: Alert and oriented x3. No focal deficits. Psych: Normal affect. Responds appropriately.  Assessment:    1. Coronary artery disease involving native  coronary artery of native heart without angina pectoris   2. History of non-ST elevation myocardial infarction (NSTEMI)   3. Ischemic cardiomyopathy   4. Chronic systolic CHF (congestive heart failure) ([place])   5. Moderate aortic stenosis   6. Heart block AV complete (HCC)   7. Subclinical atrial fibrillation   8. Hypotension, unspecified hypotension type   9. Type 2 diabetes mellitus with complication, without long-term current use of insulin ([place])   10. ESRD (end stage renal disease) ([place])     Plan:    CAD with Recent NSTEMI Patient has a long history of CAD with PCI to LAD, 2nd Diag, and OM3. He was admitted with NSTEMI in 11/2021 and underwent PCI/DES to ostial to proximal LAD. He was readmitted last week with NSTEMI and was found to have 90% in-stent restenosis of distal left main to ostial LAD with thrombus and probable stent underexpansion. He underwent difficult but successful PCI with PCTA of this lesion - No recurrent chest pain. - Continue DAPT with Aspirin and Plavix. - Continue high-intensity statin. - Previously on Coreg but this was stopped due to hypotension.   Ischemic Cardiomyopathy Chronic Systolic CHF Echo in 03/7672 showed LVEF 45-50% with akinesis of the apical septal segment, apical inferior segment, and apex as well as grade 1 diastolic dysfunction. - Euvolemic on exam.  - Volume status is being managed by hemodialysis.  - Previously on Coreg but this was stopped due to hypotension.  - No ACEi/ARB due to renal function.   Moderate Aortic Stenosis Noted on Echo during recent admission. - Will continue serial monitoring as an outpatient. Will likely need repeat Echo in 1-2 years.   Complete Heart Block S/p ICD-CRT with recent gen change in 09/2021. - Followed by Dr. Caryl Comes.   Subclinical Atrial Fibrillation History of atrial fibrillation noted on prior device check in 2021. Not on anticoagulation due to low atrial fibrillation burden. No atrial fibrillation  noted on device interrogations since 06/2020.    Hypotension Previously had hypertension but now struggles with hypotension  - BP soft but stable today. - Coreg stopped during recent admission. . - Takes Midodrine $RemoveBeforeD'10mg'dywlNGAIIMaxyV$  on dialysis days. Agree with Nephrologist - OK to take $Remov'5mg'GDnQTu$  on non-dialysis days if needed.  Hyperlipidemia Lipid panel in 11/2021: Total Cholesterol 146, Triglycerides 257, HDL 35, LDL 64. Lipoprotein A 71.3. LDL goal <55. - Continue Lipitor $RemoveBeforeDE'80mg'SFIhurXlzkZhBBG$  daily.  - Patient is going to come back next week for repeat lipid panel and LFTs.   Type 2 Diabetes Mellitus Hemoglobin A1c 7.5 in 11/2021. - Management per PCP.   ESRD  On hemodialysis on M/W/F. Takes Midodrine on dialysis days. - Management per Nephrology.  Hypomagnesemia Magnesium was 1.6 on day of discharge. I cannot tell whether this was repl - Will recheck Magnesium when he comes back in for repeat labs next week.  Disposition: Patient already has an appointment with Dr. [name] scheduled for 03/2022. Recommended keeping this.   Medication Adjustments/Labs and Tests Ordered: Current medicines are reviewed at length with the patient today.  Concerns regarding medicines are outlined above.  Orders Placed This Encounter  Procedures   Magnesium   EKG 12-Lead   No orders of the defined types were placed in this encounter.   Patient Instructions  Medication Instructions:  No changes  *If you need a refill on your cardiac medications before your next appointment, please call your pharmacy*   Lab Work: Magnesium Level. In 1 week If you have labs (blood work) drawn today and your tests are completely normal, you will receive your results only by: [place] (if you have MyChart) OR A paper copy in the mail If you have any lab test that is abnormal or we need to change your treatment, we will call you to review the results.   Testing/Procedures: No Testing   Follow-Up: At [facility], you and your  health needs are our priority.  As part of our continuing mission to provide you with exceptional heart care, we have created designated Provider Care Teams.  These Care Teams include your primary Cardiologist (physician) and Advanced Practice Providers (APPs -  Physician Assistants and Nurse Practitioners) who all work together to provide you with the care you need, when you need it.  We recommend signing up for the patient portal called "MyChart".  Sign up information is provided on this After Visit Summary.  MyChart is used to connect with patients for Virtual Visits (Telemedicine).  Patients are able to view lab/test results, encounter notes, upcoming appointments, etc.  Non-urgent messages can be sent to your provider as well.   To learn more about what you can do with MyChart, go to [email or web address].    Your next appointment:   Keep Scheduled Appointment  The format for your next appointment:   In Person  Provider:   Javen [name], MD       Important Information About Sugar         Signed, Eppie Gibson  01/01/2022 3:36 PM    [place]

## 2021-12-25 NOTE — Progress Notes (Signed)
D/C order noted. Contacted [facility] and spoke to [place], Therapist, sports. Clinic advised pt will d/c today and resume care tomorrow.   Melven Sartorius Renal Navigator [phone]

## 2021-12-25 NOTE — Progress Notes (Signed)
[facility] Progress Note   Subjective:   Reports heart cath went well. Had mild dyspnea with ambulation no edema/SOB at present.  Noted some blow BP but pt reports he was asymptomatic, no dizziness. Very talkative and pleasant.  Objective Vitals:   12/24/21 2000 12/24/21 2020 12/24/21 2235 12/25/21 0546  BP: (!) 77/47 (!) 104/48 (!) 119/96 (!) 98/48  Pulse: 70 70  72  Resp: (!) 21 (!) 21  18  Temp:  98 F (36.7 C)  98.1 F (36.7 C)  TempSrc:  Oral  Oral  SpO2: 97% 97%  96%  Weight:      Height:       Physical Exam General: Elderly male, alert and in NAD Heart: RRR, no murmurs, rubs or gallops Lungs: CTA bilaterally without wheezing, rhonchi or rales Abdomen: Soft, non-distended, +BS Extremities: No edema b/l lower extermities Dialysis Access: RUE AVF + t/b  Additional Objective Labs: Basic Metabolic Panel: Recent Labs  Lab 12/23/21 1320 12/23/21 1944 12/24/21 0144 12/25/21 0348  NA 137  --  140 136  K 6.2* 5.5* 5.4* 5.5*  CL 93*  --  94* 94*  CO2 27  --  32 28  GLUCOSE 226*  --  181* 122*  BUN 41*  --  48* 25*  CREATININE 7.95*  --  8.94* 5.61*  CALCIUM 9.9  --  9.7 8.7*  PHOS  --   --  7.3* 4.8*   Liver Function Tests: Recent Labs  Lab 12/24/21 0144 12/25/21 0348  AST  --  27  ALT  --  21  ALKPHOS  --  66  BILITOT  --  0.6  PROT  --  5.7*  ALBUMIN 3.7 3.1*   No results for input(s): "LIPASE", "AMYLASE" in the last 168 hours. CBC: Recent Labs  Lab 12/23/21 1122 12/24/21 0427 12/25/21 0348  WBC 7.5 9.2 8.0  NEUTROABS  --   --  4.9  HGB 12.5* 12.3* 10.0*  HCT 38.1* 37.2* 30.8*  MCV 108.2* 106.9* 106.9*  PLT 108* 106* 112*   Blood Culture    Component Value Date/Time   SDES URINE, CLEAN CATCH 04/07/2019 1931   SPECREQUEST  04/07/2019 1931    NONE Performed at [facility], [place] [address], [place], [place] [address]    CULT >=100,000 COLONIES/mL PSEUDOMONAS AERUGINOSA (A) 04/07/2019 1931   REPTSTATUS 04/09/2019 FINAL  04/07/2019 1931    Cardiac Enzymes: No results for input(s): "CKTOTAL", "CKMB", "CKMBINDEX", "TROPONINI" in the last 168 hours. CBG: Recent Labs  Lab 12/24/21 0144 12/24/21 0644 12/24/21 1726 12/24/21 2151 12/25/21 0818  GLUCAP 179* 138* 106* 87 150*   Iron Studies: No results for input(s): "IRON", "TIBC", "TRANSFERRIN", "FERRITIN" in the last 72 hours. '@[email or web address]' @ Studies/Results: CARDIAC CATHETERIZATION  Result Date: 12/24/2021   Dist LM to Ost LAD lesion is 90% stenosed.   Prox Cx lesion is 30% stenosed.   Mid Cx to Dist Cx lesion is 40% stenosed.   3rd Mrg lesion is 50% stenosed.   Mid Cx lesion is 30% stenosed.   Ost RCA to Prox RCA lesion is 70% stenosed.   Previously placed 2nd Diag stent of unknown type is  widely patent.   Post intervention, there is a 0% residual stenosis. NSTEMI secondary to focal 90% proximal in-stent restenosis with thrombus and probable stent underexpansion. Mild residual nonobstructive CAD in the left circumflex vessel with 30, 40 and 50% stenoses.  A previously placed stent in the OM3 vessel was difficult to visualize.  No significant change in the previous 70% proximal RCA stenosis in nondominant RCA vessel. OCT demonstrating prior LAD stent underexpansion ostially with calcification and plaque. Difficult but successful PCI to the ostium proximal LAD stent treated with PTCA with a 2.5 x 12 mm, 3.0 x 12 mm compliant balloon, 3.5 and 4.0 x 15 mm noncompliant balloons. RECOMMENDATION: DAPT indefinitely.  Medical therapy for concomitant CAD.  The patient is on dialysis on Monday, Wednesday, and Fridays.  Aggressive lipid-lowering therapy with target LDL less than 55.   DG Chest 2 View  Result Date: 12/23/2021 CLINICAL DATA:  Chest pain beginning this morning. Nausea and vomiting. EXAM: CHEST - 2 VIEW COMPARISON:  11/11/2021 FINDINGS: Heart size remains at the upper limits of normal. Pacemaker again seen in expected position. Both lungs are clear. IMPRESSION:  Stable exam. No active lung disease. Electronically Signed   By: Marlaine Hind M.D.   On: 12/23/2021 11:51   Medications:  sodium chloride      aspirin  81 mg Oral Daily   atorvastatin  80 mg Oral QHS   calcitRIOL  1.75 mcg Oral Q M,W,F-HD   Chlorhexidine Gluconate Cloth  6 each Topical Q0600   clopidogrel  75 mg Oral Q breakfast   erythromycin  1 application  Left Eye QHS   gabapentin  300 mg Oral QHS   heparin injection (subcutaneous)  5,000 Units Subcutaneous Q8H   insulin aspart  0-6 Units Subcutaneous TID WC   midodrine  10 mg Oral Q M,W,F   multivitamin  1 tablet Oral Daily   pantoprazole  40 mg Oral Daily   saccharomyces boulardii  250 mg Oral BID   sevelamer carbonate  1,600 mg Oral TID WC   sodium chloride flush  3 mL Intravenous Q12H   vitamin B-12  1,000 mcg Oral Daily    Dialysis Orders: [facility] 4 hours BF 400, DF 500  2 K/2 Ca bath EDW 95.5 kg AVF Meds: mircera 75 mcg every 4 weeks (last dose on 12/10/21), venofer 50 mg weekly, calcitriol 1.75 mcg three times a week with HD   Last post weight - 95.7 kg on 12/22/21. Earlier this month was above EDW (98.9 kg post weight on 6/5) but has come down over time and met or approached EDW recently  Assessment/Plan: 1. Chest pain: Unstable angina, planned for heart cath this AM. Per cardiology.  2. ESRD: MWF schedule, hyperkalemia improved resolved with temporizing measures. No emergent indications for HD right now. Will plan for dialysis tomorrow per regular schedule.  3. HTN/volume:  BP soft, reports this is typical for him and he takes midodrine prior to dialysis. No volume overload on exam or CXR and is below his prior EDW. Minimal UF with HD 4. Anemia: Hgb 10. Not due for ESA yet but if Hgb trends down more, will increase dose 5. Secondary hyperparathyroidism:  Calcium controlled, phos elevated. Continue renvela.  6. Nutrition:  will need renal diet/fluid restrictions  Anice Paganini, PA-C 12/25/2021, 10:10 AM   [facility] Pager: [phone]

## 2021-12-25 NOTE — Progress Notes (Signed)
Progress Note  Patient Name: Rodney Farley Date of Encounter: 12/25/2021  [facility] Cardiologist: Lord [name], MD   Subjective   Denies CP or dyspnea  Inpatient Medications    Scheduled Meds:  aspirin  81 mg Oral Daily   atorvastatin  80 mg Oral QHS   calcitRIOL  1.75 mcg Oral Q M,W,F-HD   Chlorhexidine Gluconate Cloth  6 each Topical Q0600   clopidogrel  75 mg Oral Q breakfast   erythromycin  1 application  Left Eye QHS   gabapentin  300 mg Oral QHS   heparin injection (subcutaneous)  5,000 Units Subcutaneous Q8H   insulin aspart  0-6 Units Subcutaneous TID WC   midodrine  10 mg Oral Q M,W,F   multivitamin  1 tablet Oral Daily   pantoprazole  40 mg Oral Daily   saccharomyces boulardii  250 mg Oral BID   sevelamer carbonate  1,600 mg Oral TID WC   sodium chloride flush  3 mL Intravenous Q12H   vitamin B-12  1,000 mcg Oral Daily   Continuous Infusions:  sodium chloride     PRN Meds: sodium chloride, acetaminophen, carboxymethylcellul-glycerin, diazepam, midodrine, nitroGLYCERIN, ondansetron (ZOFRAN) IV, sodium chloride flush   Vital Signs    Vitals:   12/24/21 2000 12/24/21 2020 12/24/21 2235 12/25/21 0546  BP: (!) 77/47 (!) 104/48 (!) 119/96 (!) 98/48  Pulse: 70 70  72  Resp: (!) 21 (!) 21  18  Temp:  98 F (36.7 C)  98.1 F (36.7 C)  TempSrc:  Oral  Oral  SpO2: 97% 97%  96%  Weight:      Height:        Intake/Output Summary (Last 24 hours) at 12/25/2021 0744 Last data filed at 12/24/2021 2235 Gross per 24 hour  Intake 240 ml  Output --  Net 240 ml      12/24/2021    4:34 PM 12/24/2021    1:01 AM 12/03/2021    8:16 AM  Last 3 Weights  Weight (lbs) 217 lb 6 oz 208 lb 1.8 oz 221 lb 3.2 oz  Weight (kg) 98.6 kg 94.4 kg 100.336 kg      Telemetry    AV paced - Personally Reviewed  Physical Exam   GEN: NAD Neck: supple Cardiac: RRR, 3/6 systolic murmur, no rub Respiratory: CTA GI: Soft, NT/ND, right groin with no hematoma and no  bruit. MS: No edema Neuro: Grossly intact Psych: Normal affect   Labs    High Sensitivity Troponin:   Recent Labs  Lab 12/23/21 1320 12/23/21 1600 12/24/21 0144  TROPONINIHS 223* 653* 1,073*      Chemistry Recent Labs  Lab 12/23/21 1320 12/23/21 1944 12/24/21 0144 12/25/21 0348  NA 137  --  140 136  K 6.2* 5.5* 5.4* 5.5*  CL 93*  --  94* 94*  CO2 27  --  32 28  GLUCOSE 226*  --  181* 122*  BUN 41*  --  48* 25*  CREATININE 7.95*  --  8.94* 5.61*  CALCIUM 9.9  --  9.7 8.7*  MG  --   --   --  1.6*  PROT  --   --   --  5.7*  ALBUMIN  --   --  3.7 3.1*  AST  --   --   --  27  ALT  --   --   --  21  ALKPHOS  --   --   --  66  BILITOT  --   --   --  0.6  GFRNONAA 6*  --  5* 9*  ANIONGAP 17*  --  14 14      Hematology Recent Labs  Lab 12/23/21 1122 12/24/21 0427 12/25/21 0348  WBC 7.5 9.2 8.0  RBC 3.52* 3.48* 2.88*  HGB 12.5* 12.3* 10.0*  HCT 38.1* 37.2* 30.8*  MCV 108.2* 106.9* 106.9*  MCH 35.5* 35.3* 34.7*  MCHC 32.8 33.1 32.5  RDW 15.8* 15.9* 15.9*  PLT 108* 106* 112*      Radiology    CARDIAC CATHETERIZATION  Result Date: 12/24/2021   Dist LM to Ost LAD lesion is 90% stenosed.   Prox Cx lesion is 30% stenosed.   Mid Cx to Dist Cx lesion is 40% stenosed.   3rd Mrg lesion is 50% stenosed.   Mid Cx lesion is 30% stenosed.   Ost RCA to Prox RCA lesion is 70% stenosed.   Previously placed 2nd Diag stent of unknown type is  widely patent.   Post intervention, there is a 0% residual stenosis. NSTEMI secondary to focal 90% proximal in-stent restenosis with thrombus and probable stent underexpansion. Mild residual nonobstructive CAD in the left circumflex vessel with 30, 40 and 50% stenoses.  A previously placed stent in the OM3 vessel was difficult to visualize. No significant change in the previous 70% proximal RCA stenosis in nondominant RCA vessel. OCT demonstrating prior LAD stent underexpansion ostially with calcification and plaque. Difficult but successful  PCI to the ostium proximal LAD stent treated with PTCA with a 2.5 x 12 mm, 3.0 x 12 mm compliant balloon, 3.5 and 4.0 x 15 mm noncompliant balloons. RECOMMENDATION: DAPT indefinitely.  Medical therapy for concomitant CAD.  The patient is on dialysis on Monday, Wednesday, and Fridays.  Aggressive lipid-lowering therapy with target LDL less than 55.   DG Chest 2 View  Result Date: 12/23/2021 CLINICAL DATA:  Chest pain beginning this morning. Nausea and vomiting. EXAM: CHEST - 2 VIEW COMPARISON:  11/11/2021 FINDINGS: Heart size remains at the upper limits of normal. Pacemaker again seen in expected position. Both lungs are clear. IMPRESSION: Stable exam. No active lung disease. Electronically Signed   By: Marlaine Hind M.D.   On: 12/23/2021 11:51     Patient Profile     88 year old male with past medical history of end-stage renal disease dialysis dependent, coronary artery disease, history of pacemaker, moderate aortic stenosis, hypertension, hyperlipidemia, diabetes mellitus, COPD, obstructive sleep apnea for evaluation of non-ST elevation myocardial infarction.  Patient discharged in May 2023 following admission for non-ST elevation myocardial infarction.  Echocardiogram during that admission showed ejection fraction 45 to 50%, mild left ventricular hypertrophy, grade 1 diastolic dysfunction, moderate left atrial enlargement, moderate aortic stenosis with mean gradient 18 mmHg and aortic valve area 1.2 cm.  Cardiac catheterization revealed 90% first diagonal, 70% right coronary artery, 90% ostial to proximal LAD.  Patient had PCI of the LAD with drug-eluting stent.  70% stenosis of proximal right coronary artery noted but was nondominant vessel.  Readmitted with recurrent CP.   Assessment & Plan    1 unstable angina/non-patient presented with non-ST elevation myocardial infarction.  Cardiac catheterization this admission revealed 90% lesion at recently placed stent.  Patient had PTCA and has had no  recurrent chest pain.  Plan to continue aspirin and Plavix indefinitely.  Continue statin.  Carvedilol discontinued due to hypotension.   2 end-stage renal disease dialysis dependent-dialysis per nephrology.   3 hyperlipidemia-continue statin.   4 history of pacemaker-we will follow-up with electrophysiology after discharge.  5 moderate aortic stenosis-noted on echocardiogram and previous admission.  He will need follow-up echoes in the future.  Patient can be discharged from a cardiac standpoint on present medications.  I will arrange follow-up with APP in 2 weeks and Dr. [name] in 3 months.  Cardiology will sign off.  Please call with questions.  For questions or updates, please contact [place] Please consult [email or web address] for contact info under        Signed, Kirk Ruths, MD  12/25/2021, 7:44 AM

## 2021-12-26 ENCOUNTER — Telehealth: Payer: Self-pay

## 2021-12-26 ENCOUNTER — Telehealth (HOSPITAL_COMMUNITY): Payer: Self-pay | Admitting: Nephrology

## 2021-12-26 NOTE — Telephone Encounter (Signed)
Transition of care contact from inpatient facility  Date of Discharge: 12/25/21 Date of Contact: 12/26/21 Method of contact: Phone  Attempted to contact patient to discuss transition of care from inpatient admission. Patient did not answer the phone. Message was left on the patient's voicemail with call back number [phone].   Of note, he did NOT show to his outpatietn HD today.  Veneta Penton, PA-C [employer] Pager [phone]

## 2021-12-26 NOTE — Telephone Encounter (Signed)
Transition Care Management Unsuccessful Follow-up Telephone Call  Date of discharge and from where:  [facility] 12/25/21  Attempts:  1st Attempt  Reason for unsuccessful TCM follow-up call:  Left voice message

## 2021-12-28 ENCOUNTER — Other Ambulatory Visit: Payer: Self-pay | Admitting: Family Medicine

## 2021-12-29 ENCOUNTER — Other Ambulatory Visit (HOSPITAL_COMMUNITY): Payer: Self-pay

## 2021-12-29 ENCOUNTER — Encounter (HOSPITAL_COMMUNITY): Payer: Self-pay

## 2021-12-29 MED ORDER — OXYMETAZOLINE HCL 0.05 % NA SOLN
1.0000 | Freq: Two times a day (BID) | NASAL | 0 refills | Status: AC | PRN
  Filled 2021-12-29: qty 30, 20d supply, fill #0
  Filled 2021-12-29 – 2021-12-30 (×2): qty 30, 150d supply, fill #0

## 2021-12-30 ENCOUNTER — Other Ambulatory Visit (HOSPITAL_COMMUNITY): Payer: Self-pay | Admitting: Cardiology

## 2021-12-30 ENCOUNTER — Other Ambulatory Visit (HOSPITAL_COMMUNITY): Payer: Self-pay

## 2022-01-01 ENCOUNTER — Telehealth (HOSPITAL_COMMUNITY): Payer: Self-pay | Admitting: *Deleted

## 2022-01-01 ENCOUNTER — Ambulatory Visit (INDEPENDENT_AMBULATORY_CARE_PROVIDER_SITE_OTHER): Payer: PPO | Admitting: Student

## 2022-01-01 ENCOUNTER — Encounter: Payer: Self-pay | Admitting: Student

## 2022-01-01 VITALS — BP 96/42 | HR 85 | Ht 67.0 in | Wt 215.4 lb

## 2022-01-01 DIAGNOSIS — I480 Paroxysmal atrial fibrillation: Secondary | ICD-10-CM

## 2022-01-01 DIAGNOSIS — I5022 Chronic systolic (congestive) heart failure: Secondary | ICD-10-CM

## 2022-01-01 DIAGNOSIS — I350 Nonrheumatic aortic (valve) stenosis: Secondary | ICD-10-CM

## 2022-01-01 DIAGNOSIS — I252 Old myocardial infarction: Secondary | ICD-10-CM | POA: Diagnosis not present

## 2022-01-01 DIAGNOSIS — I959 Hypotension, unspecified: Secondary | ICD-10-CM

## 2022-01-01 DIAGNOSIS — E118 Type 2 diabetes mellitus with unspecified complications: Secondary | ICD-10-CM

## 2022-01-01 DIAGNOSIS — I2510 Atherosclerotic heart disease of native coronary artery without angina pectoris: Secondary | ICD-10-CM

## 2022-01-01 DIAGNOSIS — I255 Ischemic cardiomyopathy: Secondary | ICD-10-CM | POA: Diagnosis not present

## 2022-01-01 DIAGNOSIS — N186 End stage renal disease: Secondary | ICD-10-CM

## 2022-01-01 DIAGNOSIS — I442 Atrioventricular block, complete: Secondary | ICD-10-CM

## 2022-01-01 NOTE — Patient Instructions (Signed)
Medication Instructions:  No changes  *If you need a refill on your cardiac medications before your next appointment, please call your pharmacy*   Lab Work: Magnesium Level. In 1 week If you have labs (blood work) drawn today and your tests are completely normal, you will receive your results only by: [place] (if you have MyChart) OR A paper copy in the mail If you have any lab test that is abnormal or we need to change your treatment, we will call you to review the results.   Testing/Procedures: No Testing   Follow-Up: At [facility], you and your health needs are our priority.  As part of our continuing mission to provide you with exceptional heart care, we have created designated Provider Care Teams.  These Care Teams include your primary Cardiologist (physician) and Advanced Practice Providers (APPs -  Physician Assistants and Nurse Practitioners) who all work together to provide you with the care you need, when you need it.  We recommend signing up for the patient portal called "MyChart".  Sign up information is provided on this After Visit Summary.  MyChart is used to connect with patients for Virtual Visits (Telemedicine).  Patients are able to view lab/test results, encounter notes, upcoming appointments, etc.  Non-urgent messages can be sent to your provider as well.   To learn more about what you can do with MyChart, go to [email or web address].    Your next appointment:   Keep Scheduled Appointment  The format for your next appointment:   In Person  Provider:   Buford [name], MD       Important Information About Sugar

## 2022-01-02 ENCOUNTER — Encounter: Payer: Self-pay | Admitting: Student

## 2022-01-03 ENCOUNTER — Other Ambulatory Visit: Payer: Self-pay | Admitting: Cardiology

## 2022-01-05 MED ORDER — GABAPENTIN 300 MG PO CAPS: 300.0000 mg | ORAL_CAPSULE | Freq: Two times a day (BID) | ORAL | Status: DC

## 2022-01-07 ENCOUNTER — Other Ambulatory Visit: Payer: Self-pay

## 2022-01-07 DIAGNOSIS — I5022 Chronic systolic (congestive) heart failure: Secondary | ICD-10-CM

## 2022-01-07 DIAGNOSIS — I959 Hypotension, unspecified: Secondary | ICD-10-CM

## 2022-01-07 DIAGNOSIS — N186 End stage renal disease: Secondary | ICD-10-CM

## 2022-01-07 DIAGNOSIS — I480 Paroxysmal atrial fibrillation: Secondary | ICD-10-CM

## 2022-01-07 DIAGNOSIS — E118 Type 2 diabetes mellitus with unspecified complications: Secondary | ICD-10-CM

## 2022-01-07 DIAGNOSIS — I252 Old myocardial infarction: Secondary | ICD-10-CM

## 2022-01-07 DIAGNOSIS — I350 Nonrheumatic aortic (valve) stenosis: Secondary | ICD-10-CM

## 2022-01-07 DIAGNOSIS — I442 Atrioventricular block, complete: Secondary | ICD-10-CM

## 2022-01-07 DIAGNOSIS — I255 Ischemic cardiomyopathy: Secondary | ICD-10-CM

## 2022-01-07 DIAGNOSIS — I2510 Atherosclerotic heart disease of native coronary artery without angina pectoris: Secondary | ICD-10-CM

## 2022-01-07 LAB — HEPATIC FUNCTION PANEL
ALT: 24 IU/L (ref 0–44)
AST: 21 IU/L (ref 0–40)
Albumin: 4.3 g/dL (ref 3.6–4.6)
Alkaline Phosphatase: 90 IU/L (ref 44–121)
Bilirubin Total: 0.4 mg/dL (ref 0.0–1.2)
Bilirubin, Direct: 0.14 mg/dL (ref 0.00–0.40)
Total Protein: 6.4 g/dL (ref 6.0–8.5)

## 2022-01-07 LAB — LIPID PANEL
Chol/HDL Ratio: 3.7 ratio (ref 0.0–5.0)
Cholesterol, Total: 153 mg/dL (ref 100–199)
HDL: 41 mg/dL (ref >39)
LDL Chol Calc (NIH): 79 mg/dL (ref 0–99)
Triglycerides: 197 mg/dL — ABNORMAL HIGH (ref 0–149)
VLDL Cholesterol Cal: 33 mg/dL (ref 5–40)

## 2022-01-09 ENCOUNTER — Other Ambulatory Visit: Payer: Self-pay | Admitting: Cardiology

## 2022-01-12 ENCOUNTER — Ambulatory Visit (INDEPENDENT_AMBULATORY_CARE_PROVIDER_SITE_OTHER): Payer: PPO

## 2022-01-12 DIAGNOSIS — I5023 Acute on chronic systolic (congestive) heart failure: Secondary | ICD-10-CM | POA: Insufficient documentation

## 2022-01-12 DIAGNOSIS — I5032 Chronic diastolic (congestive) heart failure: Secondary | ICD-10-CM | POA: Insufficient documentation

## 2022-01-12 DIAGNOSIS — I442 Atrioventricular block, complete: Secondary | ICD-10-CM

## 2022-01-12 DIAGNOSIS — I5022 Chronic systolic (congestive) heart failure: Secondary | ICD-10-CM | POA: Insufficient documentation

## 2022-01-15 ENCOUNTER — Encounter: Payer: Self-pay | Admitting: Internal Medicine

## 2022-01-15 ENCOUNTER — Ambulatory Visit (INDEPENDENT_AMBULATORY_CARE_PROVIDER_SITE_OTHER): Payer: PPO | Admitting: Internal Medicine

## 2022-01-15 VITALS — BP 114/56 | HR 85 | Ht 67.0 in | Wt 217.6 lb

## 2022-01-15 DIAGNOSIS — Z95810 Presence of automatic (implantable) cardiac defibrillator: Secondary | ICD-10-CM

## 2022-01-15 DIAGNOSIS — I4891 Unspecified atrial fibrillation: Secondary | ICD-10-CM | POA: Diagnosis not present

## 2022-01-15 DIAGNOSIS — I442 Atrioventricular block, complete: Secondary | ICD-10-CM | POA: Diagnosis not present

## 2022-01-15 DIAGNOSIS — I5032 Chronic diastolic (congestive) heart failure: Secondary | ICD-10-CM | POA: Diagnosis not present

## 2022-01-15 DIAGNOSIS — I255 Ischemic cardiomyopathy: Secondary | ICD-10-CM | POA: Diagnosis not present

## 2022-01-15 NOTE — Progress Notes (Signed)
Patient ID: Rodney Farley, male   DOB: [birth year], 88 y.o.   MRN: [id number]        Patient Care Team: [name], MD as PCP - General (Family Medicine) [name], Ehtan M, MD as PCP - Cardiology (Cardiology) Evelina Bucy, DPM (Inactive) as Consulting Physician (Podiatry) Katy Fitch, Darlina Guys, MD as Consulting Physician (Ophthalmology) Jamal Maes, MD as Consulting Physician (Nephrology) Deboraha Sprang, MD as Consulting Physician (Cardiology) Center, [facility]   HPI  Rodney Farley is a 88 y.o. male Seen in follow-up for a pacemaker implanted in 2010 elsewhere. We saw him to establish in 2013. He has a history of complete heart block. He underwent CRT-D upgrade 3/17.  And after lengthy discussions underwent generator replacement 2/23 with both low voltage and high-voltage therapies.  End-stage renal disease on dialysis  He has history of ischemic heart disease with prior MI.  Suffered intercurrent non-STEMI 5/23 and then recurrent non-STEMI 6/23 at which time the LAD ostial lesion had ISR and in-stent thrombosis   The patient denies chest pain, nocturnal dyspnea, orthopnea .  There have been no palpitations, lightheadedness or syncope.  Complains of fatigue and exercise intolerance including dyspnea.  He is using his inhalers frequently.  He has edema.  He is volume overloaded but dialysis duration and fluid removal has been limited because of cramping.   DATE TEST EF   3/19 Echo  35-40%   12/16 Echo  25-30%   7/17 Echo   65 %   4/19 Echo   60-65 %   4/19 Myoview 45-50% Scar  5/23 Echo  45-50% AS  Mod Grad -m 18 mm  5/23 LHC  LADo-p 90%>>stent D1 90%: RCAo-p 70%  6/23 LHC  LADo-p 90% ISR and thrombus>>POBA    Date   Cr  K Hgb    11/20 5 5.0  9.9   4/21   4.5   11.6   1/22    6.3   6/23  5.5 10     Past Medical History:  Diagnosis Date   AICD (automatic cardioverter/defibrillator) present    Anemia    Anxiety    Arthritis     Atrioventricular block, complete ([place])    a. 2010 s/p pacemaker.   Bell's palsy    left side. after shingles episode   Bilateral renal cysts 07/23/2017   Simple and hemorrhagic noted on CT ab/pelvis    BPH associated with nocturia    Chronic systolic CHF (congestive heart failure) ([place])    EF normalized by Echo 2019   COPD (chronic obstructive pulmonary disease) ([place])    Severe   Coronary artery disease    a. s/p MI in 1994/1995 while in [place] s/p questionable PCI. 03/2015: progression of disease, for staged PCI.   Depression    Diabetic peripheral neuropathy (HCC)    Diverticulitis    Full dentures    Gallstones    GERD (gastroesophageal reflux disease)    Gout    Hard of hearing    B/L   History of chronic pancreatitis 07/23/2017   noted on CT abd/pelvis   History of shingles    Hypercholesterolemia    Hypertension    Ischemic cardiomyopathy    MI (myocardial infarction) ([place]) 1994; 1995   Neuropathy    IN LOWER EXTREMITIES   Nosebleed 10/06/2017   for 2 months most recent 10/06/2017   Obesity    Pacemaker    medtronic>>> MDT ICD 09/23/15   Ruptured  appendicitis    Sleep apnea    "sleeps w/humidifyer when he panics and gets short of breath" (04/08/2015)   TIA (transient ischemic attack) X 3   Trigger middle finger of left hand    Type II diabetes mellitus ([place])    Wears glasses    Wears hearing aid    Past Surgical History:  Procedure Laterality Date   APPENDECTOMY     AV FISTULA PLACEMENT Right 02/07/2019   Procedure: ARTERIOVENOUS (AV) FISTULA CREATION RIGHT UPPER ARM;  Surgeon: Waynetta Sandy, MD;  Location: [place];  Service: Vascular;  Laterality: Right;   BIV ICD GENERATOR CHANGEOUT N/A 10/09/2021   Procedure: BIV ICD GENERATOR CHANGEOUT;  Surgeon: Deboraha Sprang, MD;  Location: [place] [facility];  Service: Cardiovascular;  Laterality: N/A;   CARDIAC CATHETERIZATION N/A 03/29/2015   Procedure: Right/Left Heart Cath and Coronary Angiography;  Surgeon:  Desean M [name], MD;  Location: [place] [facility];  Service: Cardiovascular;  Laterality: N/A;   [place]   "after my MI; put me on heart RX after cath"   CARDIAC CATHETERIZATION N/A 04/09/2015   Procedure: Coronary Stent Intervention;  Surgeon: Leonides M [name], MD;  Location: [place] [facility];  Service: Cardiovascular;  Laterality: N/A;   CATARACT EXTRACTION W/ INTRAOCULAR LENS  IMPLANT, BILATERAL     CHOLECYSTECTOMY N/A 10/13/2017   Procedure: LAPAROSCOPIC CHOLECYSTECTOMY WITH LYSIS OF ADHESIONS;  Surgeon: Ileana Roup, MD;  Location: [facility];  Service: General;  Laterality: N/A;   COLONOSCOPY     CORONARY ANGIOGRAPHY N/A 11/12/2021   Procedure: CORONARY ANGIOGRAPHY;  Surgeon: Wellington Hampshire, MD;  Location: [place] [facility];  Service: Cardiovascular;  Laterality: N/A;   CORONARY ANGIOGRAPHY N/A 12/24/2021   Procedure: CORONARY ANGIOGRAPHY;  Surgeon: Troy Sine, MD;  Location: [place] [facility];  Service: Cardiovascular;  Laterality: N/A;   CORONARY BALLOON ANGIOPLASTY N/A 12/24/2021   Procedure: CORONARY BALLOON ANGIOPLASTY;  Surgeon: Troy Sine, MD;  Location: [place] [facility];  Service: Cardiovascular;  Laterality: N/A;   CORONARY STENT INTERVENTION N/A 11/12/2021   Procedure: CORONARY STENT INTERVENTION;  Surgeon: Wellington Hampshire, MD;  Location: [place] [facility];  Service: Cardiovascular;  Laterality: N/A;  LAD   DENTAL SURGERY     EP IMPLANTABLE DEVICE N/A 09/23/2015   MDT CRT-D, Dr. Caryl Comes   HIATAL HERNIA REPAIR  1977   ILEOCECETOMY N/A 03/27/2017   Procedure: ILEOCECECTOMY;  Surgeon: Ileana Roup, MD;  Location: [place];  Service: General;  Laterality: N/A;   INSERT / REPLACE / REMOVE PACEMAKER  07/2008   Complete heart block status post DDD with good function   INTRAVASCULAR IMAGING/OCT N/A 12/24/2021   Procedure: INTRAVASCULAR IMAGING/OCT;  Surgeon: Troy Sine, MD;  Location: [place] [facility];  Service: Cardiovascular;  Laterality:  N/A;   IR FLUORO GUIDE CV LINE RIGHT  04/03/2019   IR US GUIDE VASC ACCESS RIGHT  04/03/2019   LAPAROTOMY N/A 03/27/2017   Procedure: EXPLORATORY LAPAROTOMY;  Surgeon: Ileana Roup, MD;  Location: [place];  Service: General;  Laterality: N/A;   TONSILLECTOMY     UPPER GI ENDOSCOPY      Current Outpatient Medications  Medication Sig Dispense Refill   aspirin EC 81 MG tablet Take 81 mg by mouth daily.     atorvastatin (LIPITOR) 80 MG tablet Take 1 tablet (80 mg total) by mouth daily. (Patient taking differently: Take 80 mg by mouth at bedtime.) 90  tablet 3   calcitRIOL (ROCALTROL) 0.25 MCG capsule Take 7 capsules (1.75 mcg total) by mouth every Monday, Wednesday, and Friday with hemodialysis. 100 capsule 1   carbamide peroxide (DEBROX) 6.5 % OTIC solution Place 5 drops into both ears 2 (two) times daily as needed (earwax).     Carboxymethylcellul-Glycerin (LUBRICATING EYE DROPS OP) Place 1 drop into both eyes 3 (three) times daily as needed (dry eyes).     clopidogrel (PLAVIX) 75 MG tablet Take 1 tablet (75 mg total) by mouth daily with breakfast. 90 tablet 3   erythromycin ophthalmic ointment Place 1 application into the left eye at bedtime.     gabapentin (NEURONTIN) 300 MG capsule Take 1 capsule (300 mg total) by mouth 2 (two) times daily.     glucose blood (FREESTYLE TEST STRIPS) test strip Use to check blood sugar daily 100 each 3   glucose monitoring kit (FREESTYLE) monitoring kit USE TO MONITOR BLOOD GLUCOSE AS DIRECTED 1 each 1   lidocaine (XYLOCAINE) 5 % ointment Apply 1 application topically as needed. 35.44 g 5   Methoxy PEG-Epoetin Beta (MIRCERA IJ) as directed.     midodrine (PROAMATINE) 10 MG tablet Take 1 tablet (10 mg) three times a week. Add 5 mg a night after dialysis as needed for low blood pressure. (Patient taking differently: Take 5-10 mg by mouth See admin instructions. Take 1 tablet (10 mg) three times a week. Add 5 mg a night after dialysis as needed for low blood  pressure.) 24 tablet 3   multivitamin (RENA-VIT) TABS tablet Take 1 tablet by mouth daily. 90 tablet 3   nitroGLYCERIN (NITROSTAT) 0.4 MG SL tablet Place 1 tablet (0.4 mg total) under the tongue every 5 (five) minutes x 3 doses as needed for chest pain. 25 tablet 12   oxymetazoline (AFRIN) 0.05 % nasal spray Place 1 spray into both nostrils 2 (two) times daily as needed for congestion. 30 mL 0   pantoprazole (PROTONIX) 40 MG tablet Take 1 tablet (40 mg total) by mouth daily. 90 tablet 3   saccharomyces boulardii (FLORASTOR) 250 MG capsule Take 1 capsule (250 mg total) by mouth 2 (two) times daily. 30 capsule 0   sevelamer carbonate (RENVELA) 800 MG tablet Take 2 tablets (1,600 mg total) by mouth 3 (three) times daily with meals.     silver sulfADIAZINE (SILVADENE) 1 % cream Apply pea-sized amount to wound daily. 50 g 0   triamcinolone cream (KENALOG) 0.1 % Apply 1 application topically as directed.     vitamin B-12 (CYANOCOBALAMIN) 1000 MCG tablet Take 1,000 mcg by mouth daily.     No current facility-administered medications for this visit.   Allergies  Allergen Reactions   Bee Venom Anaphylaxis   Lyrica [Pregabalin] Other (See Comments)    hallucinations   Prednisone Other (See Comments)    hallucinations   Zocor [Simvastatin] Nausea Only and Other (See Comments)    Headache with brand name only.  Can take the generic.   Review of Systems negative except from HPI and PMH  Physical Exam: BP (!) 114/56   Pulse 85   Ht _0  (1.702 m)   Wt 217 lb 9.6 oz (98.7 kg)   BMI 34.08 kg/m  Well developed and well nourished in no acute distress HENT normal Neck supple with JVP-flat Clear Device pocket well healed; without hematoma or erythema.  There is no tethering  Regular rate and rhythm, 3/6  murmur S2 single Abd-soft with active BS No Clubbing cyanosis  2 edema Skin-warm and dry A & Oriented  Grossly normal sensory and motor function  ECG sinus with P synchronous pacing with an  upright QRS in lead V1 and negative QRS lead I    Assessment and  Plan Complete heart block  Ischemic heart disease with prior MI-intercurrent non-STEMI  Aortic stenosis question more severe than echo  ICD-CRT      CHF chronic diastolic  Renal insufficiency ESRD  SCAF    The patient is volume overloaded.  He is anuric; I have asked him to reach out to nephrology about whether more frequent dialysis might be more effective as he does not tolerate prolonged dialysis. He also has aortic stenosis.  S2 is single, and I wonder whether it is more severe than the echo suggests, I will defer this to his evaluations with Dr. Shirlee More.  Frequent PACs, we have reprogrammed his lower rate to 80 with the sleep mode on at 60; of also decreased outputs to 2.5 V in the LV and 2.0 V in the RV.

## 2022-01-15 NOTE — Patient Instructions (Signed)
Medication Instructions:  Your physician recommends that you continue on your current medications as directed. Please refer to the Current Medication list given to you today.  *If you need a refill on your cardiac medications before your next appointment, please call your pharmacy*   Lab Work: None ordered.  If you have labs (blood work) drawn today and your tests are completely normal, you will receive your results only by: MyChart Message (if you have MyChart) OR A paper copy in the mail If you have any lab test that is abnormal or we need to change your treatment, we will call you to review the results.   Testing/Procedures: None ordered.    Follow-Up: At [facility], you and your health needs are our priority.  As part of our continuing mission to provide you with exceptional heart care, we have created designated Provider Care Teams.  These Care Teams include your primary Cardiologist (physician) and Advanced Practice Providers (APPs -  Physician Assistants and Nurse Practitioners) who all work together to provide you with the care you need, when you need it.  We recommend signing up for the patient portal called "MyChart".  Sign up information is provided on this After Visit Summary.  MyChart is used to connect with patients for Virtual Visits (Telemedicine).  Patients are able to view lab/test results, encounter notes, upcoming appointments, etc.  Non-urgent messages can be sent to your provider as well.   To learn more about what you can do with MyChart, go to [email or web address].    Your next appointment:   9 months with Dr Klein  Important Information About Sugar       

## 2022-01-16 ENCOUNTER — Other Ambulatory Visit: Payer: Self-pay | Admitting: Cardiology

## 2022-01-16 LAB — CUP PACEART REMOTE DEVICE CHECK
Battery Remaining Longevity: 83 mo
Battery Voltage: 3.02 V
Brady Statistic AP VP Percent: 59.08 %
Brady Statistic AP VS Percent: 0.05 %
Brady Statistic AS VP Percent: 40.82 %
Brady Statistic AS VS Percent: 0.04 %
Brady Statistic RA Percent Paced: 59.02 %
Brady Statistic RV Percent Paced: 99.78 %
Date Time Interrogation Session: 20230706132117
HighPow Impedance: 65 Ohm
Implantable Lead Implant Date: 20100112
Implantable Lead Implant Date: 20170313
Implantable Lead Implant Date: 20170313
Implantable Lead Location: 753858
Implantable Lead Location: 753859
Implantable Lead Location: 753860
Implantable Lead Model: 4598
Implantable Lead Model: 5076
Implantable Pulse Generator Implant Date: 20230330
Lead Channel Impedance Value: 218.5 Ohm
Lead Channel Impedance Value: 218.5 Ohm
Lead Channel Impedance Value: 223.149
Lead Channel Impedance Value: 223.149
Lead Channel Impedance Value: 223.149
Lead Channel Impedance Value: 380 Ohm
Lead Channel Impedance Value: 399 Ohm
Lead Channel Impedance Value: 437 Ohm
Lead Channel Impedance Value: 437 Ohm
Lead Channel Impedance Value: 437 Ohm
Lead Channel Impedance Value: 456 Ohm
Lead Channel Impedance Value: 513 Ohm
Lead Channel Impedance Value: 589 Ohm
Lead Channel Impedance Value: 779 Ohm
Lead Channel Impedance Value: 779 Ohm
Lead Channel Impedance Value: 779 Ohm
Lead Channel Impedance Value: 779 Ohm
Lead Channel Impedance Value: 817 Ohm
Lead Channel Pacing Threshold Amplitude: 0.5 V
Lead Channel Pacing Threshold Amplitude: 1.375 V
Lead Channel Pacing Threshold Amplitude: 1.5 V
Lead Channel Pacing Threshold Pulse Width: 0.4 ms
Lead Channel Pacing Threshold Pulse Width: 0.4 ms
Lead Channel Pacing Threshold Pulse Width: 0.4 ms
Lead Channel Sensing Intrinsic Amplitude: 1.125 mV
Lead Channel Sensing Intrinsic Amplitude: 1.25 mV
Lead Channel Setting Pacing Amplitude: 2 V
Lead Channel Setting Pacing Amplitude: 2.5 V
Lead Channel Setting Pacing Amplitude: 3 V
Lead Channel Setting Pacing Pulse Width: 0.4 ms
Lead Channel Setting Pacing Pulse Width: 0.4 ms
Lead Channel Setting Sensing Sensitivity: 0.3 mV

## 2022-01-16 NOTE — Telephone Encounter (Signed)
Called  patient not available - he is at dialysis. Spoke to  patient 's daughter -in-law ( linda)  informed  her that  medication ( gabapentin)  has to be refilled by  Dr Yong Channel  ( primary ) She verbalized understanding she tates shw ill inform the patient

## 2022-01-21 ENCOUNTER — Encounter (HOSPITAL_BASED_OUTPATIENT_CLINIC_OR_DEPARTMENT_OTHER): Payer: Self-pay

## 2022-01-21 ENCOUNTER — Emergency Department (HOSPITAL_COMMUNITY)
Admission: EM | Admit: 2022-01-21 | Discharge: 2022-01-21 | Disposition: A | Discharge status: Home / Self Care | Payer: PPO | Attending: Student | Admitting: Student

## 2022-01-21 ENCOUNTER — Emergency Department (HOSPITAL_COMMUNITY): Payer: PPO

## 2022-01-21 ENCOUNTER — Other Ambulatory Visit: Payer: Self-pay

## 2022-01-21 ENCOUNTER — Emergency Department (HOSPITAL_BASED_OUTPATIENT_CLINIC_OR_DEPARTMENT_OTHER): Payer: PPO

## 2022-01-21 ENCOUNTER — Encounter (HOSPITAL_COMMUNITY): Payer: Self-pay

## 2022-01-21 ENCOUNTER — Emergency Department (HOSPITAL_BASED_OUTPATIENT_CLINIC_OR_DEPARTMENT_OTHER)
Admission: EM | Admit: 2022-01-21 | Discharge: 2022-01-21 | Disposition: A | Discharge status: Home / Self Care | Payer: PPO | Source: Home / Self Care | Attending: Emergency Medicine | Admitting: Emergency Medicine

## 2022-01-21 DIAGNOSIS — Z7902 Long term (current) use of antithrombotics/antiplatelets: Secondary | ICD-10-CM | POA: Insufficient documentation

## 2022-01-21 DIAGNOSIS — E875 Hyperkalemia: Secondary | ICD-10-CM | POA: Insufficient documentation

## 2022-01-21 DIAGNOSIS — I209 Angina pectoris, unspecified: Secondary | ICD-10-CM | POA: Insufficient documentation

## 2022-01-21 DIAGNOSIS — Z992 Dependence on renal dialysis: Secondary | ICD-10-CM | POA: Insufficient documentation

## 2022-01-21 DIAGNOSIS — N186 End stage renal disease: Secondary | ICD-10-CM | POA: Insufficient documentation

## 2022-01-21 DIAGNOSIS — I25119 Atherosclerotic heart disease of native coronary artery with unspecified angina pectoris: Secondary | ICD-10-CM | POA: Insufficient documentation

## 2022-01-21 DIAGNOSIS — R7309 Other abnormal glucose: Secondary | ICD-10-CM | POA: Insufficient documentation

## 2022-01-21 DIAGNOSIS — Z5321 Procedure and treatment not carried out due to patient leaving prior to being seen by health care provider: Secondary | ICD-10-CM | POA: Insufficient documentation

## 2022-01-21 DIAGNOSIS — R739 Hyperglycemia, unspecified: Secondary | ICD-10-CM | POA: Insufficient documentation

## 2022-01-21 DIAGNOSIS — Z7982 Long term (current) use of aspirin: Secondary | ICD-10-CM | POA: Insufficient documentation

## 2022-01-21 DIAGNOSIS — R079 Chest pain, unspecified: Secondary | ICD-10-CM | POA: Diagnosis present

## 2022-01-21 DIAGNOSIS — I2510 Atherosclerotic heart disease of native coronary artery without angina pectoris: Secondary | ICD-10-CM | POA: Insufficient documentation

## 2022-01-21 LAB — COMPREHENSIVE METABOLIC PANEL
ALT: 24 U/L (ref 0–44)
AST: 19 U/L (ref 15–41)
Albumin: 3.7 g/dL (ref 3.5–5.0)
Alkaline Phosphatase: 71 U/L (ref 38–126)
Anion gap: 14 (ref 5–15)
BUN: 38 mg/dL — ABNORMAL HIGH (ref 8–23)
CO2: 25 mmol/L (ref 22–32)
Calcium: 9.8 mg/dL (ref 8.9–10.3)
Chloride: 99 mmol/L (ref 98–111)
Creatinine, Ser: 8.44 mg/dL — ABNORMAL HIGH (ref 0.61–1.24)
GFR, Estimated: 6 mL/min — ABNORMAL LOW (ref >60)
Glucose, Bld: 201 mg/dL — ABNORMAL HIGH (ref 70–99)
Potassium: 6 mmol/L — ABNORMAL HIGH (ref 3.5–5.1)
Sodium: 138 mmol/L (ref 135–145)
Total Bilirubin: 0.4 mg/dL (ref 0.3–1.2)
Total Protein: 6.8 g/dL (ref 6.5–8.1)

## 2022-01-21 LAB — CBC
HCT: 34.5 % — ABNORMAL LOW (ref 39.0–52.0)
Hemoglobin: 11.1 g/dL — ABNORMAL LOW (ref 13.0–17.0)
MCH: 34.7 pg — ABNORMAL HIGH (ref 26.0–34.0)
MCHC: 32.2 g/dL (ref 30.0–36.0)
MCV: 107.8 fL — ABNORMAL HIGH (ref 80.0–100.0)
Platelets: 175 10*3/uL (ref 150–400)
RBC: 3.2 MIL/uL — ABNORMAL LOW (ref 4.22–5.81)
RDW: 14.9 % (ref 11.5–15.5)
WBC: 7.8 10*3/uL (ref 4.0–10.5)
nRBC: 0 % (ref 0.0–0.2)

## 2022-01-21 LAB — BASIC METABOLIC PANEL
Anion gap: 13 (ref 5–15)
BUN: 43 mg/dL — ABNORMAL HIGH (ref 8–23)
CO2: 29 mmol/L (ref 22–32)
Calcium: 10.4 mg/dL — ABNORMAL HIGH (ref 8.9–10.3)
Chloride: 97 mmol/L — ABNORMAL LOW (ref 98–111)
Creatinine, Ser: 8.28 mg/dL — ABNORMAL HIGH (ref 0.61–1.24)
GFR, Estimated: 6 mL/min — ABNORMAL LOW (ref >60)
Glucose, Bld: 112 mg/dL — ABNORMAL HIGH (ref 70–99)
Potassium: 6.1 mmol/L — ABNORMAL HIGH (ref 3.5–5.1)
Sodium: 139 mmol/L (ref 135–145)

## 2022-01-21 LAB — CBG MONITORING, ED
Glucose-Capillary: 152 mg/dL — ABNORMAL HIGH (ref 70–99)
Glucose-Capillary: 117 mg/dL — ABNORMAL HIGH (ref 70–99)

## 2022-01-21 LAB — TROPONIN I (HIGH SENSITIVITY)
Troponin I (High Sensitivity): 59 ng/L — ABNORMAL HIGH (ref <18)
Troponin I (High Sensitivity): 65 ng/L — ABNORMAL HIGH (ref <18)
Troponin I (High Sensitivity): 73 ng/L — ABNORMAL HIGH (ref <18)

## 2022-01-21 MED ORDER — DEXTROSE 50 % IV SOLN
1.0000 | Freq: Once | INTRAVENOUS | Status: AC
  Administered 2022-01-21: 50 mL via INTRAVENOUS
  Filled 2022-01-21: qty 50

## 2022-01-21 MED ORDER — SODIUM CHLORIDE 0.9 % IV SOLN
1.0000 g | Freq: Once | INTRAVENOUS | Status: AC
  Administered 2022-01-21: 1 g via INTRAVENOUS

## 2022-01-21 MED ORDER — INSULIN ASPART 100 UNIT/ML IJ SOLN
6.0000 [IU] | Freq: Once | INTRAMUSCULAR | Status: AC
  Administered 2022-01-21: 6 [IU] via INTRAVENOUS

## 2022-01-21 MED ORDER — SODIUM ZIRCONIUM CYCLOSILICATE 10 G PO PACK
10.0000 g | PACK | Freq: Once | ORAL | Status: AC
Start: 2022-01-21 — End: 2022-01-21
  Administered 2022-01-21: 10 g via ORAL
  Filled 2022-01-21: qty 1

## 2022-01-21 NOTE — ED Notes (Signed)
Pt brought in by POV from another ED r/t waiting 6 hrs Pt reports CP @ 10am relieved by 1 NTG No CP at this time

## 2022-01-21 NOTE — ED Notes (Signed)
Pt is on his way to [facility]. Nt removed pt from the floor.

## 2022-01-21 NOTE — ED Provider Notes (Signed)
[facility] EMERGENCY DEPT Provider Note   CSN: [id number] Arrival date & time: 01/21/22  1704     History  Chief Complaint  Patient presents with   Chest Pain    Rodney Farley is a 88 y.o. male.  HPI     88 year old male comes in with chief complaint of chest pain.  Patient has history of ESRD on hemodialysis, CAD status post PCI secondary to MI twice this year, last 1 being in June comes in with chief complaint of chest pain.  Patient indicates that this morning around 10 AM he started having chest pressure.  With time, as he was walking the pressure turned into pain.  Patient took nitroglycerin, his symptoms resolved.  He proceeded to go to hemodialysis, but they advised him to come to the ER.  Patient first went to [facility], ER.  Due to long wait times, he decided to come to med center [place].  Patient has been chest pain-free the entire duration.  He indicates that his chest discomfort was not similar to his MI pain.  Home Medications Prior to Admission medications   Medication Sig Start Date End Date Taking? Authorizing Provider  aspirin EC 81 MG tablet Take 81 mg by mouth daily.    [provider]  atorvastatin (LIPITOR) 80 MG tablet Take 1 tablet (80 mg total) by mouth daily. Patient taking differently: Take 80 mg by mouth at bedtime. 11/14/21   Margie Billet, PA-C  calcitRIOL (ROCALTROL) 0.25 MCG capsule Take 7 capsules (1.75 mcg total) by mouth every Monday, Wednesday, and Friday with hemodialysis. 11/14/21   Margie Billet, PA-C  carbamide peroxide (DEBROX) 6.5 % OTIC solution Place 5 drops into both ears 2 (two) times daily as needed (earwax). 11/13/21   Lelon Perla, MD  Carboxymethylcellul-Glycerin (LUBRICATING EYE DROPS OP) Place 1 drop into both eyes 3 (three) times daily as needed (dry eyes).    [provider]  clopidogrel (PLAVIX) 75 MG tablet Take 1 tablet (75 mg total) by mouth daily with breakfast.  11/14/21   Margie Billet, PA-C  erythromycin ophthalmic ointment Place 1 application into the left eye at bedtime.    [provider]  gabapentin (NEURONTIN) 300 MG capsule Take 1 capsule (300 mg total) by mouth 2 (two) times daily. 01/05/22   Lelon Perla, MD  glucose blood (FREESTYLE TEST STRIPS) test strip Use to check blood sugar daily 05/01/19   [name], MD  glucose monitoring kit (FREESTYLE) monitoring kit USE TO MONITOR BLOOD GLUCOSE AS DIRECTED 05/01/19   [name], MD  lidocaine (XYLOCAINE) 5 % ointment Apply 1 application topically as needed. 02/06/21   Narda Amber K, DO  Methoxy PEG-Epoetin Beta (MIRCERA IJ) as directed. 02/26/21 07/15/22  [provider]  midodrine (PROAMATINE) 10 MG tablet Take 1 tablet (10 mg) three times a week. Add 5 mg a night after dialysis as needed for low blood pressure. Patient taking differently: Take 5-10 mg by mouth See admin instructions. Take 1 tablet (10 mg) three times a week. Add 5 mg a night after dialysis as needed for low blood pressure. 12/03/21   Sande Rives E, PA-C  multivitamin (RENA-VIT) TABS tablet Take 1 tablet by mouth daily. 08/22/21   [name], MD  nitroGLYCERIN (NITROSTAT) 0.4 MG SL tablet Place 1 tablet (0.4 mg total) under the tongue every 5 (five) minutes x 3 doses as needed for chest pain. 11/13/21   Margie Billet, PA-C  oxymetazoline (AFRIN) 0.05 % nasal spray Place 1 spray into both nostrils 2 (two) times daily as needed for congestion. 12/29/21   [name], MD  pantoprazole (PROTONIX) 40 MG tablet Take 1 tablet (40 mg total) by mouth daily. 11/14/21   Margie Billet, PA-C  saccharomyces boulardii (FLORASTOR) 250 MG capsule Take 1 capsule (250 mg total) by mouth 2 (two) times daily. 04/08/19   Regalado, Belkys A, MD  sevelamer carbonate (RENVELA) 800 MG tablet Take 2 tablets (1,600 mg total) by mouth 3 (three) times daily with meals. 11/13/21   Lelon Perla, MD   silver sulfADIAZINE (SILVADENE) 1 % cream Apply pea-sized amount to wound daily. 10/01/20   Evelina Bucy, DPM  triamcinolone cream (KENALOG) 0.1 % Apply 1 application topically as directed. 04/10/19   [provider]  vitamin B-12 (CYANOCOBALAMIN) 1000 MCG tablet Take 1,000 mcg by mouth daily.    [provider]      Allergies    Bee venom, Lyrica [pregabalin], Prednisone, and Zocor [simvastatin]    Review of Systems   Review of Systems  All other systems reviewed and are negative.   Physical Exam Updated Vital Signs BP (!) 154/73   Pulse 80   Temp 97.9 F (36.6 C)   Resp 15   Ht _0  (1.702 m)   Wt 98.4 kg   SpO2 100%   BMI 33.98 kg/m  Physical Exam Vitals and nursing note reviewed.  Constitutional:      Appearance: He is well-developed.  HENT:     Head: Atraumatic.  Cardiovascular:     Rate and Rhythm: Normal rate.  Pulmonary:     Effort: Pulmonary effort is normal.  Musculoskeletal:     Cervical back: Neck supple.  Skin:    General: Skin is warm.  Neurological:     Mental Status: He is alert and oriented to person, place, and time.     ED Results / Procedures / Treatments   Labs (all labs ordered are listed, but only abnormal results are displayed) Labs Reviewed  BASIC METABOLIC PANEL - Abnormal; Notable for the following components:      Result Value   Potassium 6.1 (*)    Chloride 97 (*)    Glucose, Bld 112 (*)    BUN 43 (*)    Creatinine, Ser 8.28 (*)    Calcium 10.4 (*)    GFR, Estimated 6 (*)    All other components within normal limits  TROPONIN I (HIGH SENSITIVITY) - Abnormal; Notable for the following components:   Troponin I (High Sensitivity) 65 (*)    All other components within normal limits  TROPONIN I (HIGH SENSITIVITY) - Abnormal; Notable for the following components:   Troponin I (High Sensitivity) 73 (*)    All other components within normal limits    EKG EKG Interpretation  Date/Time:  Wednesday January 21 2022 19:50:22 EDT Ventricular Rate:  80 PR Interval:    QRS Duration: 179 QT Interval:  464 QTC Calculation: 536 R Axis:   247 Text Interpretation: Accelerated junctional rhythm Right bundle branch block No acute changes No significant change since last tracing Confirmed by Varney Biles ([id number]) on 01/21/2022 8:29:20 PM  Radiology DG Chest Port 1 View  Result Date: 01/21/2022 CLINICAL DATA:  Chest pain EXAM: PORTABLE CHEST 1 VIEW COMPARISON:  01/21/2022 FINDINGS: Left-sided multi lead pacing device. Cardiomegaly with vascular congestion and diffuse increased interstitial opacity probably due to mild edema. No pleural effusion or pneumothorax.  Aortic atherosclerosis IMPRESSION: Cardiomegaly with increased congestion and increased diffuse interstitial process probably due to interstitial edema Electronically Signed   By: Donavan Foil M.D.   On: 01/21/2022 19:59   DG Chest 2 View  Result Date: 01/21/2022 CLINICAL DATA:  Chest pain EXAM: CHEST - 2 VIEW COMPARISON:  12/23/2021 FINDINGS: No focal consolidation. No pleural effusion or pneumothorax. Heart and mediastinal contours are unremarkable. Three lead cardiac pacemaker. No acute osseous abnormality. IMPRESSION: 1. No active cardiopulmonary disease. Electronically Signed   By: Kathreen Devoid M.D.   On: 01/21/2022 12:18    Procedures Procedures    Medications Ordered in ED Medications  calcium gluconate 1 g in sodium chloride 0.9 % 100 mL IVPB (has no administration in time range)  insulin aspart (novoLOG) injection 6 Units (has no administration in time range)  dextrose 50 % solution 50 mL (has no administration in time range)  sodium zirconium cyclosilicate (LOKELMA) packet 10 g (10 g Oral Given 01/21/22 2003)    ED Course/ Medical Decision Making/ A&P Clinical Course as of 01/21/22 2059  Wed Jan 21, 2022  2054 I discussed case with Dr. Mitzie Na, cardiology.  We discussed patient's atypical chest pressure earlier today followed by some  pain which resolved with nitroglycerin.  We discussed patient's recent CAD requiring PCI.  We discussed patient's slightly uptrending troponin in the setting of missed hemodialysis.  Since patient has remained pain-free, Dr. Mitzie Na recommends that patient be discharged for hemodialysis tomorrow.  No further [place] at this time. [AN]  2055 The patient appears reasonably screened and/or stabilized for discharge and I doubt any other medical condition or other [facility] requiring further screening, evaluation, or treatment in the ED at this time prior to discharge.   Results from the ER workup discussed with the patient face to face and all questions answered to the best of my ability. The patient is safe for discharge with strict return precautions.   [AN]    Clinical Course User Index [AN] Varney Biles, MD                           Medical Decision Making Amount and/or Complexity of Data Reviewed Labs: ordered. Radiology: ordered.  Risk Prescription drug management.   This patient presents to the ED with chief complaint(s) of chest pain with pertinent past medical history of CAD status post PCI on 2 separate occasions this year and ESRD on hemodialysis with missed session today which further complicates the presenting complaint. The complaint involves an extensive differential diagnosis and also carries with it a high risk of complications and morbidity.    The differential diagnosis includes : Angina secondary to demand ischemia, MI, volume overload, electrolyte abnormality.  Patient's blood work at [facility] indicated that he had potassium of 6.  Repeat basic metabolic profile ordered to ensure that the potassium is not rising significantly.  Lokelma given for that.  Patient also had initial high-sensitivity troponin of 59 there.  The repeat troponin here is slightly elevated in the 60s.  My suspicion is that the rising troponin is secondary to demand, especially given that he did not get  his hemodialysis today.  From missed HD perspective, besides hyperkalemia no other concerning red flags.  Hyperkalemia is not causing any peaked T waves.  Patient also has AICD which is reassuring.  The initial plan is to get another troponin and metabolic profile to ensure that the chest pain profile is reassuring.  Pain  appears atypical based on patient's history.  We will discuss case with cardiology given the proximity to recent MI.   Additional history obtained: Additional history obtained from family Records reviewed previous admission documents  Independent labs interpretation:  The following labs were independently interpreted: Troponin levels were reviewed, gentle uptrend noted.  Mild hyperkalemia noted  Independent visualization of imaging: - I independently visualized the following imaging with scope of interpretation limited to determining acute life threatening conditions related to emergency care: X-ray of the chest, which revealed mild interstitial edema.  Patient is not hypoxic.  Treatment and Reassessment: Patient reassessed after I discussed the case with cardiology.  He has been updated on the plan.  I consulted cardiology service for this encounter for.  Final Clinical Impression(s) / ED Diagnoses Final diagnoses:  Angina pectoris ([place])  ESRD (end stage renal disease) ([place])  Hyperkalemia    Rx / DC Orders ED Discharge Orders     None         Varney Biles, MD 01/21/22 2102

## 2022-01-21 NOTE — ED Triage Notes (Signed)
Patient reports he was having chest pain that was radiating down the left arm.  Took nitro at 1030 and by 1050 all symptoms had resolved. Patient has hx of angina and dialysis patient.  Was suppose to have dialysis this am.

## 2022-01-21 NOTE — ED Notes (Signed)
Cardiology consulted for MD Nanavati by this RN.

## 2022-01-21 NOTE — ED Triage Notes (Signed)
Patient here POV from Another ED.   Endorses CP that began at 1015 in Mid Chest and radiated to Left Arm. Went to ED but LWBS due to Wait.   Patient endorses taking SL NTG at 1030 which did Resolve Pain. Very Recent MI. ESRD Patient and has Treatments MWF (Last Treatment was Monday).   NAD Noted during Triage. A&Ox4. GCS 15. Ambulatory with Gilford Rile.

## 2022-01-21 NOTE — ED Provider Triage Note (Signed)
Emergency Medicine Provider Triage Evaluation Note  Rodney Farley , a 88 y.o. male  was evaluated in triage.  Patient with an extensive cardiac history to include multiple stents, pacemaker, multiple MIs, most recently 3 weeks ago where he clotted off his stent from May.  Presenting with chest pain.  He said that it felt like angina this morning and took his nitroglycerin.  He is a dialysis patient and when he got to dialysis today they told him that because of this episode they wanted him evaluated in the emergency department prior to having dialysis.  He says it does not feel like his heart attacks.  All symptoms have resolved  Review of Systems  Positive: Chest pain Negative: Palpitations or shortness of breath  Physical Exam  BP (!) 145/71   Pulse 81   Temp 98.3 F (36.8 C) (Oral)   Resp 18   Ht 5\' 7"  (1.702 m)   Wt 98.4 kg   SpO2 97%   BMI 33.99 kg/m  Gen:   Awake, no distress   Resp:  Normal effort  MSK:   Moves extremities without difficulty  Other:  Normal rate, murmur consistent with cardiac history/pacemaker  Medical Decision Making  Medically screening exam initiated at 12:09 PM.  Appropriate orders placed.  Rayen Dafoe was informed that the remainder of the evaluation will be completed by another provider, this initial triage assessment does not replace that evaluation, and the importance of remaining in the ED until their evaluation is complete.    Cardiac work-up   Jahlisa Rossitto A, PA-C 01/21/22 1210

## 2022-01-23 ENCOUNTER — Encounter: Payer: Self-pay | Admitting: Cardiology

## 2022-01-23 ENCOUNTER — Other Ambulatory Visit (HOSPITAL_COMMUNITY): Payer: Self-pay | Admitting: Cardiology

## 2022-01-25 NOTE — Progress Notes (Unsigned)
Cardiology Office Note:    Date:  01/26/2022   ID:  Rodney Farley, DOB 04-07-1932, MRN [id number]  PCP:  Rodney Olp, MD  Cardiologist:  Rodney [name], MD  Electrophysiologist:  None   Referring MD: Rodney Olp, MD   Chief Complaint: hospital follow-up of NSTEMI  History of Present Illness:    Rodney Farley is a 88 y.o. male with a history of CAD with remote MI in 30 while in [place], PCIs to LAD/ 2nd Diag/ OM3 in 2016, and recent NSTEMI on 11/11/2021 s/p DES to ostial to LAD; ischemic cardiomyopathy/ chronic systolic CHF with EF as low as 25-30% in 2016 with subsequent normalization and then EF of 45-50% on most recent Echo on 11/11/2021; complete heart block s/p PPM which was upgraded to ICD-CRT in 2017 (gen change in 09/2021); subclinical atrial fibrillation noted on device in 2021 not on anticoagulation, moderate aortic stenosis, hypertension; hyperlipidemia; type 2 diabetes mellitus; ESRD on hemodialysis; obstructive sleep apnea; COPD; TIA; and BPH who is followed by Dr. [name] and Dr. Caryl Farley and presents today for hospital follow-up of NSTEMI.  Patient was recently admitted from 11/11/2021 to 11/13/2021 for NSTEMI after presenting with sharp substernal chest pain radiating to the left upper extremity with associated nausea, diaphoresis, and dyspnea. High-sensitivity troponin peaked at 4,677. Echo showed LVEF of 45-50% with akinesis of the apical septal segment, apical inferior segment, and apex as well as grade 1 diastolic dysfunction and moderate aortic stenosis. LHC on 11/12/2021 90% stenosis of ostial to proximal LAD, 90% stenosis of ostial 1st Diag, and 70% stenosis of ostial to proximal RCA, and then mild disease in OM3, 2nd Diag, and LPAV. He underwent successful PCI with DES to ostial to proximal LAD lesion with a 3.0 x 24 mm Synergy stent. This was post dilated to 3.5 mm. This was proximal to the old stent.He was started on DAPT with Aspirin and Plavix and home Lipitor  was increased.  He was readmitted from 12/23/2021 to 12/25/2021 for NSTEMI after presenting with chest pain that radiated to bilateral arms with associated nausea and vomiting. Pain improved with Nitro. High-sensitivity troponin 223 >> 653 >> 1,073. Repeat LHC which showed 90% in-stent restenosis of distal left main to ostial LAD with thrombus and  stent underexpansion at the ostium with OCT. Patient underwent difficult but successful PCI with PCTA of this lesion. Dilated to high pressures with a 4.0 mm [place] balloon.  He was continued on DAPT but Coreg was stopped due to low BP.   The patient notes that the following day he continued to have angina. It is occurring daily. It is relieved typically after 20 minutes with one sl Ntg. He doesn't really have pain when at dialysis. Typically awakens him from sleep. He still has issues with low BP and has to take midodrine on dialysis days. He is frustrated with the amount of angina he is having.    Past Medical History:  Diagnosis Date   AICD (automatic cardioverter/defibrillator) present    Anemia    Anxiety    Arthritis    Atrioventricular block, complete ([place])    a. 2010 s/p pacemaker.   Bell's palsy    left side. after shingles episode   Bilateral renal cysts 07/23/2017   Simple and hemorrhagic noted on CT ab/pelvis    BPH associated with nocturia    Chronic systolic CHF (congestive heart failure) ([place])    EF normalized by Echo 2019   COPD (chronic obstructive pulmonary disease) (  [place])    Severe   Coronary artery disease    a. s/p MI in 1994/1995 while in [place] s/p questionable PCI. 03/2015: progression of disease, for staged PCI.   Depression    Diabetic peripheral neuropathy (HCC)    Diverticulitis    Full dentures    Gallstones    GERD (gastroesophageal reflux disease)    Gout    Hard of hearing    B/L   History of chronic pancreatitis 07/23/2017   noted on CT abd/pelvis   History of shingles    Hypercholesterolemia    Hypertension     Ischemic cardiomyopathy    MI (myocardial infarction) ([place]) 1994; 1995   Neuropathy    IN LOWER EXTREMITIES   Nosebleed 10/06/2017   for 2 months most recent 10/06/2017   Obesity    Pacemaker    medtronic>>> MDT ICD 09/23/15   Ruptured appendicitis    Sleep apnea    "sleeps w/humidifyer when he panics and gets short of breath" (04/08/2015)   TIA (transient ischemic attack) X 3   Trigger middle finger of left hand    Type II diabetes mellitus ([place])    Wears glasses    Wears hearing aid     Past Surgical History:  Procedure Laterality Date   APPENDECTOMY     AV FISTULA PLACEMENT Right 02/07/2019   Procedure: ARTERIOVENOUS (AV) FISTULA CREATION RIGHT UPPER ARM;  Surgeon: Waynetta Sandy, MD;  Location: [facility];  Service: Vascular;  Laterality: Right;   BIV ICD GENERATOR CHANGEOUT N/A 10/09/2021   Procedure: BIV ICD GENERATOR CHANGEOUT;  Surgeon: Deboraha Sprang, MD;  Location: [facility];  Service: Cardiovascular;  Laterality: N/A;   CARDIAC CATHETERIZATION N/A 03/29/2015   Procedure: Right/Left Heart Cath and Coronary Angiography;  Surgeon: Dakoda M [name], MD;  Location: [facility];  Service: Cardiovascular;  Laterality: N/A;   [place]   "after my MI; put me on heart RX after cath"   CARDIAC CATHETERIZATION N/A 04/09/2015   Procedure: Coronary Stent Intervention;  Surgeon: Valiant M [name], MD;  Location: [facility];  Service: Cardiovascular;  Laterality: N/A;   CATARACT EXTRACTION W/ INTRAOCULAR LENS  IMPLANT, BILATERAL     CHOLECYSTECTOMY N/A 10/13/2017   Procedure: LAPAROSCOPIC CHOLECYSTECTOMY WITH LYSIS OF ADHESIONS;  Surgeon: Ileana Roup, MD;  Location: [facility];  Service: General;  Laterality: N/A;   COLONOSCOPY     CORONARY ANGIOGRAPHY N/A 11/12/2021   Procedure: CORONARY ANGIOGRAPHY;  Surgeon: Wellington Hampshire, MD;  Location: [facility];  Service: Cardiovascular;  Laterality: N/A;   CORONARY ANGIOGRAPHY N/A  12/24/2021   Procedure: CORONARY ANGIOGRAPHY;  Surgeon: Troy Sine, MD;  Location: [facility];  Service: Cardiovascular;  Laterality: N/A;   CORONARY BALLOON ANGIOPLASTY N/A 12/24/2021   Procedure: CORONARY BALLOON ANGIOPLASTY;  Surgeon: Troy Sine, MD;  Location: [facility];  Service: Cardiovascular;  Laterality: N/A;   CORONARY STENT INTERVENTION N/A 11/12/2021   Procedure: CORONARY STENT INTERVENTION;  Surgeon: Wellington Hampshire, MD;  Location: [facility];  Service: Cardiovascular;  Laterality: N/A;  LAD   DENTAL SURGERY     EP IMPLANTABLE DEVICE N/A 09/23/2015   MDT CRT-D, Dr. Caryl Farley   HIATAL HERNIA REPAIR  1977   ILEOCECETOMY N/A 03/27/2017   Procedure: ILEOCECECTOMY;  Surgeon: Ileana Roup, MD;  Location: [facility];  Service: General;  Laterality: N/A;   INSERT / REPLACE / REMOVE  PACEMAKER  07/2008   Complete heart block status post DDD with good function   INTRAVASCULAR IMAGING/OCT N/A 12/24/2021   Procedure: INTRAVASCULAR IMAGING/OCT;  Surgeon: Troy Sine, MD;  Location: [facility];  Service: Cardiovascular;  Laterality: N/A;   IR FLUORO GUIDE CV LINE RIGHT  04/03/2019   IR US GUIDE VASC ACCESS RIGHT  04/03/2019   LAPAROTOMY N/A 03/27/2017   Procedure: EXPLORATORY LAPAROTOMY;  Surgeon: Ileana Roup, MD;  Location: [facility];  Service: General;  Laterality: N/A;   TONSILLECTOMY     UPPER GI ENDOSCOPY      Current Medications: Current Meds  Medication Sig   aspirin EC 81 MG tablet Take 81 mg by mouth daily.   atorvastatin (LIPITOR) 80 MG tablet Take 1 tablet (80 mg total) by mouth daily. (Patient taking differently: Take 80 mg by mouth at bedtime.)   carbamide peroxide (DEBROX) 6.5 % OTIC solution Place 5 drops into both ears 2 (two) times daily as needed (earwax).   Carboxymethylcellul-Glycerin (LUBRICATING EYE DROPS OP) Place 1 drop into both eyes 3 (three) times daily as needed (dry eyes).   clopidogrel (PLAVIX) 75 MG tablet Take 1  tablet (75 mg total) by mouth daily with breakfast.   erythromycin ophthalmic ointment Place 1 application into the left eye at bedtime.   gabapentin (NEURONTIN) 300 MG capsule Take 1 capsule (300 mg total) by mouth 2 (two) times daily.   glucose blood (FREESTYLE TEST STRIPS) test strip Use to check blood sugar daily   glucose monitoring kit (FREESTYLE) monitoring kit USE TO MONITOR BLOOD GLUCOSE AS DIRECTED   lidocaine (XYLOCAINE) 5 % ointment Apply 1 application topically as needed.   Methoxy PEG-Epoetin Beta (MIRCERA IJ) as directed.   midodrine (PROAMATINE) 10 MG tablet Take 1 tablet (10 mg) three times a week. Add 5 mg a night after dialysis as needed for low blood pressure. (Patient taking differently: Take 5-10 mg by mouth See admin instructions. Take 1 tablet (10 mg) three times a week. Add 5 mg a night after dialysis as needed for low blood pressure.)   multivitamin (RENA-VIT) TABS tablet Take 1 tablet by mouth daily.   nitroGLYCERIN (NITROSTAT) 0.4 MG SL tablet Place 1 tablet (0.4 mg total) under the tongue every 5 (five) minutes x 3 doses as needed for chest pain.   oxymetazoline (AFRIN) 0.05 % nasal spray Place 1 spray into both nostrils 2 (two) times daily as needed for congestion.   pantoprazole (PROTONIX) 40 MG tablet Take 1 tablet (40 mg total) by mouth daily.   saccharomyces boulardii (FLORASTOR) 250 MG capsule Take 1 capsule (250 mg total) by mouth 2 (two) times daily.   sevelamer carbonate (RENVELA) 800 MG tablet Take 2 tablets (1,600 mg total) by mouth 3 (three) times daily with meals.   silver sulfADIAZINE (SILVADENE) 1 % cream Apply pea-sized amount to wound daily.   triamcinolone cream (KENALOG) 0.1 % Apply 1 application topically as directed.   vitamin B-12 (CYANOCOBALAMIN) 1000 MCG tablet Take 1,000 mcg by mouth daily.   [DISCONTINUED] calcitRIOL (ROCALTROL) 0.25 MCG capsule Take 7 capsules (1.75 mcg total) by mouth every Monday, Wednesday, and Friday with hemodialysis.      Allergies:   Bee venom, Lyrica [pregabalin], Prednisone, and Zocor [simvastatin]   Social History   Socioeconomic History   Marital status: Legally Separated    Spouse name: Not on file   Number of children: 1   Years of education: college   Highest education level: Not  on file  Occupational History   Occupation: Retired  Tobacco Use   Smoking status: Former    Packs/day: 1.50    Years: 54.00    Total pack years: 81.00    Types: Cigarettes    Quit date: 07/18/2007    Years since quitting: 14.5    Passive exposure: Never   Smokeless tobacco: Never  Vaping Use   Vaping Use: Never used  Substance and Sexual Activity   Alcohol use: Yes    Comment: rare   Drug use: No   Sexual activity: Never  Other Topics Concern   Not on file  Social History Narrative   Married 1994 (together since 1989) 1 son, 1 stepson. 3 grandkids, 6 great grandkids      Retired from [employer]. Had 2 years of collge.       Faith: Mormon      Here on [address] since 2004 from [place]; wife recently left and relocated to [facility]    Social Determinants of Health   Financial Resource Strain: Not on [employer] Insecurity: Not on file  Transportation Needs: Not on file  Physical Activity: Not on file  Stress: Not on file  Social Connections: Not on file     Family History: [name] family history includes Heart attack in his brother; Leukemia in his father; Stroke in his mother and sister.  ROS:   Please see the history of present illness.     EKGs/Labs/Other Studies Reviewed:    The following studies were reviewed today:  Echocardiogram 11/11/2021: Impressions:  1. Left ventricular ejection fraction, by estimation, is 45 to 50%. The  left ventricle has mildly decreased function. The left ventricle  demonstrates regional wall motion abnormalities (see scoring  diagram/findings for description). The left ventricular   internal cavity size was mildly dilated. There is mild  left ventricular  hypertrophy. Left ventricular diastolic parameters are consistent with  Grade I diastolic dysfunction (impaired relaxation).   2. Right ventricular systolic function is normal. The right ventricular  size is normal. Tricuspid regurgitation signal is inadequate for assessing  PA pressure.   3. Left atrial size was moderately dilated.   4. The mitral valve is degenerative. Trivial mitral valve regurgitation.  No evidence of mitral stenosis.   5. The aortic valve was not well visualized. Aortic valve regurgitation  is not visualized. Moderate aortic valve stenosis. Vmax 2.9 m/s, MG  74mHg, AVA 1.2 cm^2, DI 0.38. _______________   Coronary Stent Intervention 11/12/2021:   Ost 1st Diag to 1st Diag lesion is 90% stenosed.   Ost RCA to Prox RCA lesion is 70% stenosed.   3rd Mrg lesion is 10% stenosed.   2nd Diag-2 lesion is 10% stenosed.   Mid LAD lesion is 20% stenosed.   1st Diag lesion is 30% stenosed.   2nd Diag-1 lesion is 40% stenosed.   LPAV lesion is 40% stenosed.   Ost LAD to Prox LAD lesion is 90% stenosed.   A drug-eluting stent was successfully placed using a SYNERGY XD 3.0X24.   Post intervention, there is a 0% residual stenosis.   1.  Patent LAD, diagonal and OM stent with no significant restenosis.  Severe 90% stenosis in ostial/proximal LAD is a likely culprit for myocardial infarction.  70% stenosis in ostial right coronary artery but the vessel is nondominant. 2.  Successful angioplasty and drug-eluting stent placement to the ostial/proximal LAD. 3.  I could not cross the aortic valve due to significant iliac  and aortic tortuosity and I elected not to use a straight wire given that the patient just had an echocardiogram showing moderate aortic stenosis.   Recommendations: Dual antiplatelet therapy for at least 12 months. Aggressive treatment of risk factors.   Diagnostic Dominance: Left Intervention     _______________  Left Cardiac  Catheterization 12/24/2021:   Dist LM to Ost LAD lesion is 90% stenosed.   Prox Cx lesion is 30% stenosed.   Mid Cx to Dist Cx lesion is 40% stenosed.   3rd Mrg lesion is 50% stenosed.   Mid Cx lesion is 30% stenosed.   Ost RCA to Prox RCA lesion is 70% stenosed.   Previously placed 2nd Diag stent of unknown type is  widely patent.   Post intervention, there is a 0% residual stenosis.   NSTEMI secondary to focal 90% proximal in-stent restenosis with thrombus and probable stent underexpansion.   Mild residual nonobstructive CAD in the left circumflex vessel with 30, 40 and 50% stenoses.  A previously placed stent in the OM3 vessel was difficult to visualize.   No significant change in the previous 70% proximal RCA stenosis in nondominant RCA vessel.   OCT demonstrating prior LAD stent underexpansion ostially with calcification and plaque.   Difficult but successful PCI to the ostium proximal LAD stent treated with PTCA with a 2.5 x 12 mm, 3.0 x 12 mm compliant balloon, 3.5 and 4.0 x 15 mm noncompliant balloons.   Recommendation: DAPT indefinitely.  Medical therapy for concomitant CAD.  The patient is on dialysis on Monday, Wednesday, and Fridays.  Aggressive lipid-lowering therapy with target LDL less than 55.  Diagnostic Dominance: Right  Intervention     EKG:  EKG  is not ordered today.   Recent Labs: 11/11/2021: TSH 1.400 01/06/2022: Magnesium 2.3 01/21/2022: ALT 24; BUN 43; Creatinine, Ser 8.28; Hemoglobin 11.1; Platelets 175; Potassium 6.1; Sodium 139  Recent Lipid Panel    Component Value Date/Time   CHOL 153 01/06/2022 1228   TRIG 197 (H) 01/06/2022 1228   HDL 41 01/06/2022 1228   CHOLHDL 3.7 01/06/2022 1228   CHOLHDL 4.2 11/12/2021 0554   VLDL 47 (H) 11/12/2021 0554   LDLCALC 79 01/06/2022 1228   LDLDIRECT 60.0 04/13/2018 0926    Physical Exam:    Vital Signs: BP 106/65   Pulse 90   Ht _0  (1.702 m)   Wt 212 lb 6.4 oz (96.3 kg)   SpO2 94%   BMI 33.27 kg/m      Wt Readings from Last 3 Encounters:  01/26/22 212 lb 6.4 oz (96.3 kg)  01/21/22 216 lb 14.9 oz (98.4 kg)  01/21/22 217 lb (98.4 kg)     General: 88 y.o. Caucasian male in no acute distress. HEENT: Normocephalic and atraumatic. Sclera clear.  Neck: Supple. No JVD. Heart: RRR. III/VI systolic murmur best heard at upper sternal border.  Lungs: No increased work of breathing. Clear to ausculation bilaterally. No wheezes, rhonchi, or rales.  Abdomen: Soft, non-distended, and non-tender to palpation.  Extremities: No lower extremity edema.    Skin: Warm and dry. Neuro: Alert and oriented x3. No focal deficits. Psych: Normal affect. Responds appropriately.  Assessment:    1. Nonrheumatic aortic valve stenosis   2. Coronary artery disease of native artery of native heart with stable angina pectoris ([place])   3. Chronic systolic CHF (congestive heart failure) ([place])   4. Atrioventricular block, complete ([place])   5. 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  Plan:    CAD s/p multiple stents in 2016. Presented in May with severe stenosis ostial-proximal LAD treated with DES. Had early restenosis with thrombus in June likely related to stent underexpansion. Treated with high pressure [place] balloon to 4.0 mm with OCT guidance.   - patient unfortunately still having significant angina class 3.  - Continue DAPT with Aspirin and Plavix. - Continue high-intensity statin. - no good options for antianginal therapy given hypotension with dialysis. Not a candidate for [place] with ESRD.  - encouraged him to liberally use sl Ntg as needed for chest pain.  - will further evaluate the aortic stenosis since this may be contributing to his angina.  - may ultimately require relook angiogram     2. Ischemic Cardiomyopathy Chronic Systolic CHF Echo in 07/3683 showed LVEF 45-50% with akinesis of the apical septal segment, apical inferior segment, and apex as well as grade 1 diastolic  dysfunction. - Euvolemic on exam.  - Volume status is being managed by hemodialysis.  - Previously on Coreg but this was stopped due to hypotension.  - No ACEi/ARB due to renal function.   3. Moderate Aortic Stenosis Noted on Echo during recent admission. - I personally reviewed Echo. I am concerned his AS may be more severe than indicated on Echo.  -recommend CT calcium score of the AV. If severe calcification may need to do [facility] and be possibly considered for TAVR.    4. Complete Heart Block S/p ICD-CRT with recent gen change in 09/2021. - Followed by Dr. Caryl Farley.   5. Subclinical Atrial Fibrillation History of atrial fibrillation noted on prior device check in 2021. Not on anticoagulation due to low atrial fibrillation burden. No atrial fibrillation noted on device interrogations since 06/2020.    6. Hypotension Previously had hypertension but now struggles with hypotension  - BP soft but stable today. - on no BP lowering medication. - Takes Midodrine 79m on dialysis days. OK to take additional 535mon non-dialysis days if needed.  7. Hyperlipidemia  - Continue Lipitor 8071maily.  - last LDL 79 in June   8. Type 2 Diabetes Mellitus Hemoglobin A1c 7.5 in 11/2021. - Management per PCP.   9. ESRD On hemodialysis on M/W/F. Takes Midodrine on dialysis days. - Management per Nephrology.  Follow up after CT AV calcium score  Medication Adjustments/Labs and Tests Ordered: Current medicines are reviewed at length with the patient today.  Concerns regarding medicines are outlined above.  Orders Placed This Encounter  Procedures   CT CARDIAC SCORING (SELF PAY ONLY)   Meds ordered this encounter  Medications   calcitRIOL (ROCALTROL) 0.25 MCG capsule    Sig: Take 7 capsules (1.75 mcg total) by mouth every Monday, Wednesday, and Friday with hemodialysis.    Dispense:  100 capsule    Refill:  3       Signed, Jacquan JorMartiniqueD  01/26/2022 3:41 PM    [facility]

## 2022-01-26 ENCOUNTER — Encounter: Payer: Self-pay | Admitting: Cardiology

## 2022-01-26 ENCOUNTER — Other Ambulatory Visit (HOSPITAL_COMMUNITY): Payer: Self-pay

## 2022-01-26 ENCOUNTER — Ambulatory Visit (INDEPENDENT_AMBULATORY_CARE_PROVIDER_SITE_OTHER): Payer: PPO | Admitting: Cardiology

## 2022-01-26 VITALS — BP 106/65 | HR 90 | Ht 67.0 in | Wt 212.4 lb

## 2022-01-26 DIAGNOSIS — I25118 Atherosclerotic heart disease of native coronary artery with other forms of angina pectoris: Secondary | ICD-10-CM | POA: Diagnosis not present

## 2022-01-26 DIAGNOSIS — I442 Atrioventricular block, complete: Secondary | ICD-10-CM

## 2022-01-26 DIAGNOSIS — Z95810 Presence of automatic (implantable) cardiac defibrillator: Secondary | ICD-10-CM

## 2022-01-26 DIAGNOSIS — I350 Nonrheumatic aortic (valve) stenosis: Secondary | ICD-10-CM | POA: Diagnosis not present

## 2022-01-26 DIAGNOSIS — I5022 Chronic systolic (congestive) heart failure: Secondary | ICD-10-CM

## 2022-01-26 MED ORDER — CALCITRIOL 0.25 MCG PO CAPS: 1.7500 ug | ORAL_CAPSULE | ORAL | 3 refills | Status: AC

## 2022-01-26 MED ORDER — CALCITRIOL 0.25 MCG PO CAPS
1.7500 ug | ORAL_CAPSULE | ORAL | 3 refills | Status: DC
  Filled 2022-01-26: qty 100, 33d supply, fill #0

## 2022-01-26 NOTE — Patient Instructions (Signed)
Medication Instructions:  Continue same medications   Testing/Procedures: Coronary calcium score   Follow-Up: At [facility], you and your health needs are our priority.  As part of our continuing mission to provide you with exceptional heart care, we have created designated Provider Care Teams.  These Care Teams include your primary Cardiologist (physician) and Advanced Practice Providers (APPs -  Physician Assistants and Nurse Practitioners) who all work together to provide you with the care you need, when you need it.  We recommend signing up for the patient portal called "MyChart".  Sign up information is provided on this After Visit Summary.  MyChart is used to connect with patients for Virtual Visits (Telemedicine).  Patients are able to view lab/test results, encounter notes, upcoming appointments, etc.  Non-urgent messages can be sent to your provider as well.   To learn more about what you can do with MyChart, go to [email or web address].    Your next appointment:  After test    The format for your next appointment: Office    Provider:  Dr.Jordan   Important Information About Sugar

## 2022-01-27 ENCOUNTER — Other Ambulatory Visit (HOSPITAL_COMMUNITY): Payer: Self-pay

## 2022-01-28 ENCOUNTER — Encounter (HOSPITAL_COMMUNITY): Payer: Self-pay

## 2022-01-29 LAB — MAGNESIUM: Magnesium: 2.3 mg/dL (ref 1.6–2.3)

## 2022-01-29 LAB — SPECIMEN STATUS REPORT

## 2022-02-02 NOTE — Progress Notes (Signed)
Remote ICD transmission.   

## 2022-02-03 ENCOUNTER — Ambulatory Visit (HOSPITAL_BASED_OUTPATIENT_CLINIC_OR_DEPARTMENT_OTHER)
Admission: RE | Admit: 2022-02-03 | Discharge: 2022-02-03 | Disposition: A | Discharge status: Home / Self Care | Payer: PPO | Source: Ambulatory Visit | Attending: Cardiology | Admitting: Cardiology

## 2022-02-03 ENCOUNTER — Other Ambulatory Visit (HOSPITAL_COMMUNITY): Payer: Self-pay

## 2022-02-03 DIAGNOSIS — I350 Nonrheumatic aortic (valve) stenosis: Secondary | ICD-10-CM | POA: Insufficient documentation

## 2022-02-08 ENCOUNTER — Emergency Department (HOSPITAL_BASED_OUTPATIENT_CLINIC_OR_DEPARTMENT_OTHER): Payer: PPO | Admitting: Radiology

## 2022-02-08 ENCOUNTER — Other Ambulatory Visit: Payer: Self-pay

## 2022-02-08 ENCOUNTER — Inpatient Hospital Stay (HOSPITAL_BASED_OUTPATIENT_CLINIC_OR_DEPARTMENT_OTHER)
Admission: EM | Admit: 2022-02-08 | Discharge: 2022-02-10 | DRG: 250 | Disposition: A | Discharge status: Home / Self Care | Payer: PPO | Attending: Internal Medicine | Admitting: Internal Medicine

## 2022-02-08 ENCOUNTER — Encounter (HOSPITAL_COMMUNITY): Payer: Self-pay | Admitting: Internal Medicine

## 2022-02-08 DIAGNOSIS — I132 Hypertensive heart and chronic kidney disease with heart failure and with stage 5 chronic kidney disease, or end stage renal disease: Secondary | ICD-10-CM | POA: Diagnosis present

## 2022-02-08 DIAGNOSIS — R569 Unspecified convulsions: Secondary | ICD-10-CM | POA: Diagnosis not present

## 2022-02-08 DIAGNOSIS — I350 Nonrheumatic aortic (valve) stenosis: Secondary | ICD-10-CM | POA: Diagnosis present

## 2022-02-08 DIAGNOSIS — M898X9 Other specified disorders of bone, unspecified site: Secondary | ICD-10-CM | POA: Diagnosis present

## 2022-02-08 DIAGNOSIS — G4733 Obstructive sleep apnea (adult) (pediatric): Secondary | ICD-10-CM | POA: Diagnosis present

## 2022-02-08 DIAGNOSIS — Z8673 Personal history of transient ischemic attack (TIA), and cerebral infarction without residual deficits: Secondary | ICD-10-CM

## 2022-02-08 DIAGNOSIS — Z7982 Long term (current) use of aspirin: Secondary | ICD-10-CM

## 2022-02-08 DIAGNOSIS — I200 Unstable angina: Secondary | ICD-10-CM | POA: Diagnosis not present

## 2022-02-08 DIAGNOSIS — T82855A Stenosis of coronary artery stent, initial encounter: Secondary | ICD-10-CM | POA: Diagnosis present

## 2022-02-08 DIAGNOSIS — I959 Hypotension, unspecified: Secondary | ICD-10-CM | POA: Diagnosis present

## 2022-02-08 DIAGNOSIS — M109 Gout, unspecified: Secondary | ICD-10-CM | POA: Diagnosis present

## 2022-02-08 DIAGNOSIS — I442 Atrioventricular block, complete: Secondary | ICD-10-CM | POA: Diagnosis present

## 2022-02-08 DIAGNOSIS — Z87891 Personal history of nicotine dependence: Secondary | ICD-10-CM

## 2022-02-08 DIAGNOSIS — R778 Other specified abnormalities of plasma proteins: Secondary | ICD-10-CM

## 2022-02-08 DIAGNOSIS — I214 Non-ST elevation (NSTEMI) myocardial infarction: Secondary | ICD-10-CM | POA: Diagnosis present

## 2022-02-08 DIAGNOSIS — Z888 Allergy status to other drugs, medicaments and biological substances status: Secondary | ICD-10-CM

## 2022-02-08 DIAGNOSIS — N401 Enlarged prostate with lower urinary tract symptoms: Secondary | ICD-10-CM | POA: Diagnosis present

## 2022-02-08 DIAGNOSIS — R351 Nocturia: Secondary | ICD-10-CM | POA: Diagnosis present

## 2022-02-08 DIAGNOSIS — Y831 Surgical operation with implant of artificial internal device as the cause of abnormal reaction of the patient, or of later complication, without mention of misadventure at the time of the procedure: Secondary | ICD-10-CM | POA: Diagnosis present

## 2022-02-08 DIAGNOSIS — Z7189 Other specified counseling: Secondary | ICD-10-CM | POA: Diagnosis not present

## 2022-02-08 DIAGNOSIS — E669 Obesity, unspecified: Secondary | ICD-10-CM | POA: Diagnosis present

## 2022-02-08 DIAGNOSIS — G40409 Other generalized epilepsy and epileptic syndromes, not intractable, without status epilepticus: Secondary | ICD-10-CM | POA: Diagnosis not present

## 2022-02-08 DIAGNOSIS — G4730 Sleep apnea, unspecified: Secondary | ICD-10-CM | POA: Diagnosis present

## 2022-02-08 DIAGNOSIS — Z91030 Bee allergy status: Secondary | ICD-10-CM | POA: Diagnosis not present

## 2022-02-08 DIAGNOSIS — E1122 Type 2 diabetes mellitus with diabetic chronic kidney disease: Secondary | ICD-10-CM | POA: Diagnosis present

## 2022-02-08 DIAGNOSIS — I5022 Chronic systolic (congestive) heart failure: Secondary | ICD-10-CM | POA: Diagnosis present

## 2022-02-08 DIAGNOSIS — Z95810 Presence of automatic (implantable) cardiac defibrillator: Secondary | ICD-10-CM | POA: Diagnosis present

## 2022-02-08 DIAGNOSIS — Z823 Family history of stroke: Secondary | ICD-10-CM

## 2022-02-08 DIAGNOSIS — Z7902 Long term (current) use of antithrombotics/antiplatelets: Secondary | ICD-10-CM

## 2022-02-08 DIAGNOSIS — R079 Chest pain, unspecified: Secondary | ICD-10-CM

## 2022-02-08 DIAGNOSIS — Z9861 Coronary angioplasty status: Secondary | ICD-10-CM

## 2022-02-08 DIAGNOSIS — I2510 Atherosclerotic heart disease of native coronary artery without angina pectoris: Secondary | ICD-10-CM | POA: Diagnosis not present

## 2022-02-08 DIAGNOSIS — Z992 Dependence on renal dialysis: Secondary | ICD-10-CM | POA: Diagnosis not present

## 2022-02-08 DIAGNOSIS — H9193 Unspecified hearing loss, bilateral: Secondary | ICD-10-CM | POA: Diagnosis present

## 2022-02-08 DIAGNOSIS — Z515 Encounter for palliative care: Secondary | ICD-10-CM | POA: Diagnosis not present

## 2022-02-08 DIAGNOSIS — J449 Chronic obstructive pulmonary disease, unspecified: Secondary | ICD-10-CM | POA: Diagnosis present

## 2022-02-08 DIAGNOSIS — Z6833 Body mass index (BMI) 33.0-33.9, adult: Secondary | ICD-10-CM

## 2022-02-08 DIAGNOSIS — I252 Old myocardial infarction: Secondary | ICD-10-CM

## 2022-02-08 DIAGNOSIS — D631 Anemia in chronic kidney disease: Secondary | ICD-10-CM | POA: Diagnosis present

## 2022-02-08 DIAGNOSIS — I5023 Acute on chronic systolic (congestive) heart failure: Secondary | ICD-10-CM | POA: Diagnosis not present

## 2022-02-08 DIAGNOSIS — E119 Type 2 diabetes mellitus without complications: Secondary | ICD-10-CM

## 2022-02-08 DIAGNOSIS — Z66 Do not resuscitate: Secondary | ICD-10-CM | POA: Diagnosis present

## 2022-02-08 DIAGNOSIS — N186 End stage renal disease: Secondary | ICD-10-CM

## 2022-02-08 DIAGNOSIS — Z8249 Family history of ischemic heart disease and other diseases of the circulatory system: Secondary | ICD-10-CM

## 2022-02-08 DIAGNOSIS — E875 Hyperkalemia: Secondary | ICD-10-CM | POA: Diagnosis present

## 2022-02-08 DIAGNOSIS — Z950 Presence of cardiac pacemaker: Secondary | ICD-10-CM | POA: Diagnosis present

## 2022-02-08 DIAGNOSIS — K219 Gastro-esophageal reflux disease without esophagitis: Secondary | ICD-10-CM | POA: Diagnosis present

## 2022-02-08 DIAGNOSIS — I25110 Atherosclerotic heart disease of native coronary artery with unstable angina pectoris: Secondary | ICD-10-CM | POA: Diagnosis present

## 2022-02-08 DIAGNOSIS — E1142 Type 2 diabetes mellitus with diabetic polyneuropathy: Secondary | ICD-10-CM | POA: Diagnosis present

## 2022-02-08 DIAGNOSIS — Z79899 Other long term (current) drug therapy: Secondary | ICD-10-CM

## 2022-02-08 DIAGNOSIS — I255 Ischemic cardiomyopathy: Secondary | ICD-10-CM | POA: Diagnosis present

## 2022-02-08 DIAGNOSIS — E785 Hyperlipidemia, unspecified: Secondary | ICD-10-CM | POA: Diagnosis present

## 2022-02-08 DIAGNOSIS — E7800 Pure hypercholesterolemia, unspecified: Secondary | ICD-10-CM | POA: Diagnosis present

## 2022-02-08 HISTORY — DX: Anxiety disorder, unspecified: F41.9

## 2022-02-08 HISTORY — DX: Depression, unspecified: F32.A

## 2022-02-08 LAB — HEPATIC FUNCTION PANEL
ALT: 25 U/L (ref 0–44)
AST: 17 U/L (ref 15–41)
Albumin: 4.4 g/dL (ref 3.5–5.0)
Alkaline Phosphatase: 81 U/L (ref 38–126)
Bilirubin, Direct: 0.1 mg/dL (ref 0.0–0.2)
Indirect Bilirubin: 0.3 mg/dL (ref 0.3–0.9)
Total Bilirubin: 0.4 mg/dL (ref 0.3–1.2)
Total Protein: 7 g/dL (ref 6.5–8.1)

## 2022-02-08 LAB — BASIC METABOLIC PANEL
Anion gap: 19 — ABNORMAL HIGH (ref 5–15)
BUN: 31 mg/dL — ABNORMAL HIGH (ref 8–23)
CO2: 28 mmol/L (ref 22–32)
Calcium: 10.5 mg/dL — ABNORMAL HIGH (ref 8.9–10.3)
Chloride: 93 mmol/L — ABNORMAL LOW (ref 98–111)
Creatinine, Ser: 7.49 mg/dL — ABNORMAL HIGH (ref 0.61–1.24)
GFR, Estimated: 6 mL/min — ABNORMAL LOW (ref >60)
Glucose, Bld: 179 mg/dL — ABNORMAL HIGH (ref 70–99)
Potassium: 5.2 mmol/L — ABNORMAL HIGH (ref 3.5–5.1)
Sodium: 140 mmol/L (ref 135–145)

## 2022-02-08 LAB — CBC
HCT: 33.4 % — ABNORMAL LOW (ref 39.0–52.0)
Hemoglobin: 11.3 g/dL — ABNORMAL LOW (ref 13.0–17.0)
MCH: 35.5 pg — ABNORMAL HIGH (ref 26.0–34.0)
MCHC: 33.8 g/dL (ref 30.0–36.0)
MCV: 105 fL — ABNORMAL HIGH (ref 80.0–100.0)
Platelets: 166 10*3/uL (ref 150–400)
RBC: 3.18 MIL/uL — ABNORMAL LOW (ref 4.22–5.81)
RDW: 14.9 % (ref 11.5–15.5)
WBC: 9.9 10*3/uL (ref 4.0–10.5)
nRBC: 0 % (ref 0.0–0.2)

## 2022-02-08 LAB — HEPARIN LEVEL (UNFRACTIONATED): Heparin Unfractionated: 0.22 IU/mL — ABNORMAL LOW (ref 0.30–0.70)

## 2022-02-08 LAB — TROPONIN I (HIGH SENSITIVITY)
Troponin I (High Sensitivity): 464 ng/L (ref <18)
Troponin I (High Sensitivity): 498 ng/L (ref <18)

## 2022-02-08 LAB — PROTIME-INR
INR: 1 (ref 0.8–1.2)
Prothrombin Time: 12.8 seconds (ref 11.4–15.2)

## 2022-02-08 LAB — HEPATITIS B SURFACE ANTIGEN: Hepatitis B Surface Ag: NONREACTIVE

## 2022-02-08 LAB — LIPASE, BLOOD: Lipase: 50 U/L (ref 11–51)

## 2022-02-08 LAB — HEPATITIS B CORE ANTIBODY, TOTAL: Hep B Core Total Ab: NONREACTIVE

## 2022-02-08 LAB — GLUCOSE, CAPILLARY: Glucose-Capillary: 129 mg/dL — ABNORMAL HIGH (ref 70–99)

## 2022-02-08 LAB — PHOSPHORUS: Phosphorus: 6.7 mg/dL — ABNORMAL HIGH (ref 2.5–4.6)

## 2022-02-08 MED ORDER — SEVELAMER CARBONATE 800 MG PO TABS
1600.0000 mg | ORAL_TABLET | Freq: Three times a day (TID) | ORAL | Status: DC
  Administered 2022-02-08 – 2022-02-10 (×3): 1600 mg via ORAL
  Filled 2022-02-08 (×3): qty 2

## 2022-02-08 MED ORDER — CALCIUM CARBONATE ANTACID 1250 MG/5ML PO SUSP: 500.0000 mg | Freq: Four times a day (QID) | ORAL | Status: DC | PRN

## 2022-02-08 MED ORDER — CAMPHOR-MENTHOL 0.5-0.5 % EX LOTN
1.0000 | TOPICAL_LOTION | Freq: Three times a day (TID) | CUTANEOUS | Status: DC | PRN
  Filled 2022-02-08: qty 222

## 2022-02-08 MED ORDER — PANTOPRAZOLE SODIUM 40 MG PO TBEC
40.0000 mg | DELAYED_RELEASE_TABLET | Freq: Every day | ORAL | Status: DC
  Administered 2022-02-10: 40 mg via ORAL
  Filled 2022-02-08: qty 1

## 2022-02-08 MED ORDER — NEPRO/CARBSTEADY PO LIQD: 237.0000 mL | Freq: Three times a day (TID) | ORAL | Status: DC | PRN

## 2022-02-08 MED ORDER — INSULIN ASPART 100 UNIT/ML IJ SOLN: 0.0000 [IU] | Freq: Three times a day (TID) | INTRAMUSCULAR | Status: DC

## 2022-02-08 MED ORDER — SORBITOL 70 % SOLN: 30.0000 mL | Status: DC | PRN

## 2022-02-08 MED ORDER — SODIUM CHLORIDE 0.9% FLUSH: 3.0000 mL | Freq: Two times a day (BID) | INTRAVENOUS | Status: DC

## 2022-02-08 MED ORDER — MIDODRINE HCL 5 MG PO TABS
10.0000 mg | ORAL_TABLET | ORAL | Status: DC
Start: 2022-02-09 — End: 2022-02-09

## 2022-02-08 MED ORDER — ZOLPIDEM TARTRATE 5 MG PO TABS: 5.0000 mg | ORAL_TABLET | Freq: Every evening | ORAL | Status: DC | PRN

## 2022-02-08 MED ORDER — NITROGLYCERIN 0.4 MG SL SUBL
SUBLINGUAL_TABLET | SUBLINGUAL | Status: AC
  Filled 2022-02-08: qty 1

## 2022-02-08 MED ORDER — SODIUM ZIRCONIUM CYCLOSILICATE 10 G PO PACK
10.0000 g | PACK | Freq: Two times a day (BID) | ORAL | Status: DC
  Administered 2022-02-08: 10 g via ORAL
  Filled 2022-02-08 (×2): qty 1

## 2022-02-08 MED ORDER — CARBOXYMETHYLCELLUL-GLYCERIN 0.5-0.9 % OP SOLN
1.0000 [drp] | Freq: Three times a day (TID) | OPHTHALMIC | Status: DC | PRN
  Filled 2022-02-08: qty 15

## 2022-02-08 MED ORDER — RENA-VITE PO TABS
1.0000 | ORAL_TABLET | Freq: Every day | ORAL | Status: DC
Start: 2022-02-09 — End: 2022-02-10
  Administered 2022-02-10: 1 via ORAL
  Filled 2022-02-08: qty 1

## 2022-02-08 MED ORDER — ACETAMINOPHEN 650 MG RE SUPP: 650.0000 mg | Freq: Four times a day (QID) | RECTAL | Status: DC | PRN

## 2022-02-08 MED ORDER — GABAPENTIN 300 MG PO CAPS
300.0000 mg | ORAL_CAPSULE | Freq: Two times a day (BID) | ORAL | Status: DC
  Administered 2022-02-08 – 2022-02-10 (×3): 300 mg via ORAL
  Filled 2022-02-08 (×3): qty 1

## 2022-02-08 MED ORDER — HEPARIN (PORCINE) 25000 UT/250ML-% IV SOLN
1450.0000 [IU]/h | INTRAVENOUS | Status: DC
  Administered 2022-02-09: 1450 [IU]/h via INTRAVENOUS
  Filled 2022-02-08: qty 250

## 2022-02-08 MED ORDER — ASPIRIN 81 MG PO CHEW
324.0000 mg | CHEWABLE_TABLET | Freq: Once | ORAL | Status: AC
  Administered 2022-02-08: 324 mg via ORAL
  Filled 2022-02-08: qty 4

## 2022-02-08 MED ORDER — ATORVASTATIN CALCIUM 80 MG PO TABS
80.0000 mg | ORAL_TABLET | Freq: Every day | ORAL | Status: DC
  Administered 2022-02-08 – 2022-02-10 (×2): 80 mg via ORAL
  Filled 2022-02-08 (×2): qty 1

## 2022-02-08 MED ORDER — HEPARIN (PORCINE) 25000 UT/250ML-% IV SOLN
INTRAVENOUS | Status: AC
  Administered 2022-02-08: 1200 [IU]/h via INTRAVENOUS
  Filled 2022-02-08: qty 250

## 2022-02-08 MED ORDER — CHLORHEXIDINE GLUCONATE CLOTH 2 % EX PADS
6.0000 | MEDICATED_PAD | Freq: Every day | CUTANEOUS | Status: DC
  Administered 2022-02-09: 6 via TOPICAL

## 2022-02-08 MED ORDER — SEVELAMER CARBONATE 800 MG PO TABS
800.0000 mg | ORAL_TABLET | ORAL | Status: DC
  Administered 2022-02-08: 800 mg via ORAL
  Filled 2022-02-08: qty 1

## 2022-02-08 MED ORDER — MIDODRINE HCL 5 MG PO TABS: 5.0000 mg | ORAL_TABLET | ORAL | Status: DC

## 2022-02-08 MED ORDER — HYDROXYZINE HCL 25 MG PO TABS
25.0000 mg | ORAL_TABLET | Freq: Three times a day (TID) | ORAL | Status: DC | PRN
  Administered 2022-02-08: 25 mg via ORAL
  Filled 2022-02-08: qty 1

## 2022-02-08 MED ORDER — MIDODRINE HCL 5 MG PO TABS: 5.0000 mg | ORAL_TABLET | Freq: Every day | ORAL | Status: DC | PRN

## 2022-02-08 MED ORDER — ONDANSETRON HCL 4 MG PO TABS: 4.0000 mg | ORAL_TABLET | Freq: Four times a day (QID) | ORAL | Status: DC | PRN

## 2022-02-08 MED ORDER — ASPIRIN 81 MG PO CHEW
81.0000 mg | CHEWABLE_TABLET | ORAL | Status: AC
  Administered 2022-02-09: 81 mg via ORAL
  Filled 2022-02-08: qty 1

## 2022-02-08 MED ORDER — ACETAMINOPHEN 325 MG PO TABS
650.0000 mg | ORAL_TABLET | Freq: Four times a day (QID) | ORAL | Status: DC | PRN
  Administered 2022-02-08: 650 mg via ORAL
  Filled 2022-02-08: qty 2

## 2022-02-08 MED ORDER — DOCUSATE SODIUM 283 MG RE ENEM: 1.0000 | ENEMA | RECTAL | Status: DC | PRN

## 2022-02-08 MED ORDER — SEVELAMER CARBONATE 800 MG PO TABS: 1600.0000 mg | ORAL_TABLET | ORAL | Status: DC

## 2022-02-08 MED ORDER — ONDANSETRON HCL 4 MG/2ML IJ SOLN: 4.0000 mg | Freq: Four times a day (QID) | INTRAMUSCULAR | Status: DC | PRN

## 2022-02-08 MED ORDER — SODIUM CHLORIDE 0.9 % IV SOLN: 250.0000 mL | INTRAVENOUS | Status: DC | PRN

## 2022-02-08 MED ORDER — HYDRALAZINE HCL 20 MG/ML IJ SOLN: 5.0000 mg | INTRAMUSCULAR | Status: DC | PRN

## 2022-02-08 MED ORDER — CLOPIDOGREL BISULFATE 75 MG PO TABS
75.0000 mg | ORAL_TABLET | Freq: Every day | ORAL | Status: DC
  Administered 2022-02-09 – 2022-02-10 (×2): 75 mg via ORAL
  Filled 2022-02-08 (×2): qty 1

## 2022-02-08 MED ORDER — SODIUM CHLORIDE 0.9 % IV SOLN: INTRAVENOUS | Status: DC

## 2022-02-08 MED ORDER — SODIUM CHLORIDE 0.9% FLUSH
3.0000 mL | Freq: Two times a day (BID) | INTRAVENOUS | Status: DC
  Administered 2022-02-08: 3 mL via INTRAVENOUS

## 2022-02-08 MED ORDER — CLOPIDOGREL BISULFATE 75 MG PO TABS
75.0000 mg | ORAL_TABLET | Freq: Once | ORAL | Status: AC
  Administered 2022-02-08: 75 mg via ORAL
  Filled 2022-02-08: qty 1

## 2022-02-08 MED ORDER — CALCITRIOL 0.25 MCG PO CAPS
1.7500 ug | ORAL_CAPSULE | ORAL | Status: DC
  Administered 2022-02-09: 1.75 ug via ORAL
  Filled 2022-02-08 (×2): qty 1

## 2022-02-08 MED ORDER — NITROGLYCERIN 0.4 MG SL SUBL
0.4000 mg | SUBLINGUAL_TABLET | SUBLINGUAL | Status: DC | PRN
  Administered 2022-02-08 (×2): 0.4 mg via SUBLINGUAL
  Filled 2022-02-08: qty 1

## 2022-02-08 MED ORDER — SODIUM CHLORIDE 0.9% FLUSH: 3.0000 mL | INTRAVENOUS | Status: DC | PRN

## 2022-02-08 NOTE — Progress Notes (Signed)
ANTICOAGULATION CONSULT NOTE - Follow Up Consult  Pharmacy Consult for Heparin Indication: chest pain/ACS  Allergies  Allergen Reactions   Bee Venom Anaphylaxis   Lyrica [Pregabalin] Other (See Comments)    hallucinations   Prednisone Other (See Comments)    hallucinations   Zocor [Simvastatin] Nausea Only and Other (See Comments)    Headache with brand name only.  Can take the generic.    Patient Measurements: Height: '5\' 7"'  (170.2 cm) Weight: 97.1 kg (214 lb 1.6 oz) IBW/kg (Calculated) : 66.1 Heparin Dosing Weight: 86.7 kg  Vital Signs: Temp: 97.2 F (36.2 C) (07/30 1757) Temp Source: Oral (07/30 1757) BP: 126/56 (07/30 2000) Pulse Rate: 81 (07/30 1757)  Labs: Recent Labs    02/08/22 0956 02/08/22 1011 02/08/22 1247 02/08/22 2025  HGB 11.3*  --   --   --   HCT 33.4*  --   --   --   PLT 166  --   --   --   LABPROT  --  12.8  --   --   INR  --  1.0  --   --   HEPARINUNFRC  --   --   --  0.22*  CREATININE 7.49*  --   --   --   TROPONINIHS 464*  --  498*  --      Estimated Creatinine Clearance: 7.6 mL/min (A) (by C-G formula based on SCr of 7.49 mg/dL (H)).   Medical History: Past Medical History:  Diagnosis Date   AICD (automatic cardioverter/defibrillator) present    Medtronic pacer   Anemia    Anxiety and depression    Arthritis    Bell's palsy    left side. after shingles episode   BPH associated with nocturia    Chronic systolic CHF (congestive heart failure) ([place])    EF normalized by Echo 2019   COPD (chronic obstructive pulmonary disease) ([place])    Severe   Coronary artery disease    a. s/p MI in 1994/1995 while in [place] s/p questionable PCI. 03/2015: progression of disease, for staged PCI.   Diabetic peripheral neuropathy (HCC)    GERD (gastroesophageal reflux disease)    Gout    Hard of hearing    B/L   History of chronic pancreatitis 07/23/2017   noted on CT abd/pelvis   History of shingles    Hypercholesterolemia    Hypertension     Obesity    Sleep apnea    "sleeps w/humidifyer when he panics and gets short of breath" (04/08/2015)   TIA (transient ischemic attack) X 3   Trigger middle finger of left hand    Type II diabetes mellitus ([place])    Wears glasses    Wears hearing aid     Medications:  Medications Prior to Admission  Medication Sig Dispense Refill Last Dose   aspirin EC 81 MG tablet Take 81 mg by mouth daily.   02/08/2022   atorvastatin (LIPITOR) 80 MG tablet Take 1 tablet (80 mg total) by mouth daily. (Patient taking differently: Take 80 mg by mouth at bedtime.) 90 tablet 3 02/07/2022   calcitRIOL (ROCALTROL) 0.25 MCG capsule Take 7 capsules (1.75 mcg total) by mouth every Monday, Wednesday, and Friday with hemodialysis. 100 capsule 3 Past Week   carbamide peroxide (DEBROX) 6.5 % OTIC solution Place 5 drops into both ears 2 (two) times daily as needed (earwax).   unk   Carboxymethylcellul-Glycerin (LUBRICATING EYE DROPS OP) Place 1 drop into both eyes 3 (three) times  daily as needed (dry eyes).   Past Week   clopidogrel (PLAVIX) 75 MG tablet Take 1 tablet (75 mg total) by mouth daily with breakfast. 90 tablet 3 02/08/2022   gabapentin (NEURONTIN) 300 MG capsule Take 1 capsule (300 mg total) by mouth 2 (two) times daily.   02/07/2022   midodrine (PROAMATINE) 10 MG tablet Take 1 tablet (10 mg) three times a week. Add 5 mg a night after dialysis as needed for low blood pressure. (Patient taking differently: Take 5-10 mg by mouth See admin instructions. Take 1 tablet (10 mg) three times a week. Add 5 mg a night after dialysis as needed for low blood pressure.) 24 tablet 3 02/07/2022   multivitamin (RENA-VIT) TABS tablet Take 1 tablet by mouth daily. 90 tablet 3 02/07/2022   nitroGLYCERIN (NITROSTAT) 0.4 MG SL tablet Place 1 tablet (0.4 mg total) under the tongue every 5 (five) minutes x 3 doses as needed for chest pain. 25 tablet 12 Past Week   oxymetazoline (AFRIN) 0.05 % nasal spray Place 1 spray into both nostrils 2  (two) times daily as needed for congestion. 30 mL 0 unk   pantoprazole (PROTONIX) 40 MG tablet Take 1 tablet by mouth daily. 90 tablet 3 02/07/2022   saccharomyces boulardii (FLORASTOR) 250 MG capsule Take 1 capsule (250 mg total) by mouth 2 (two) times daily. 30 capsule 0 02/07/2022   sevelamer carbonate (RENVELA) 800 MG tablet Take 2 tablets (1,600 mg total) by mouth 3 (three) times daily with meals. (Patient taking differently: Take 1,600 mg by mouth See admin instructions. Take 2 tablets by mouth three times daily with meals and 1 tablet with snacks)   02/07/2022   vitamin B-12 (CYANOCOBALAMIN) 1000 MCG tablet Take 1,000 mcg by mouth daily.   02/07/2022   glucose blood (FREESTYLE TEST STRIPS) test strip Use to check blood sugar daily 100 each 3    glucose monitoring kit (FREESTYLE) monitoring kit USE TO MONITOR BLOOD GLUCOSE AS DIRECTED 1 each 1    Methoxy PEG-Epoetin Beta (MIRCERA IJ) as directed.      Scheduled:   [START ON 02/09/2022] aspirin  81 mg Oral Pre-Cath   atorvastatin  80 mg Oral QHS   [START ON 02/09/2022] calcitRIOL  1.75 mcg Oral Q M,W,F-HD   [START ON 02/09/2022] Chlorhexidine Gluconate Cloth  6 each Topical Q0600   [START ON 02/09/2022] clopidogrel  75 mg Oral Q breakfast   gabapentin  300 mg Oral BID   [START ON 02/09/2022] insulin aspart  0-6 Units Subcutaneous TID WC   [START ON 02/09/2022] midodrine  10 mg Oral Q M,W,F   [START ON 02/09/2022] multivitamin  1 tablet Oral Daily   nitroGLYCERIN       [START ON 02/09/2022] pantoprazole  40 mg Oral Daily   sevelamer carbonate  1,600 mg Oral TID WC   sevelamer carbonate  800 mg Oral With snacks   sodium chloride flush  3 mL Intravenous Q12H   sodium chloride flush  3 mL Intravenous Q12H   sodium zirconium cyclosilicate  10 g Oral BID   Infusions:   sodium chloride     [START ON 02/09/2022] sodium chloride     heparin 1,200 Units/hr (02/08/22 1151)   PRN: sodium chloride, acetaminophen **OR** acetaminophen, calcium carbonate  (dosed in mg elemental calcium), camphor-menthol **AND** hydrOXYzine, carboxymethylcellul-glycerin, docusate sodium, feeding supplement (NEPRO CARB STEADY), hydrALAZINE, midodrine, nitroGLYCERIN, nitroGLYCERIN, ondansetron **OR** ondansetron (ZOFRAN) IV, sodium chloride flush, sorbitol, zolpidem  Assessment: 28 yom with a history of  HTN, hypercholesterolemia, complete heart block w/ pacemaker, DM, ESRD MWF HD, HF, and recent NSTEMI. Patient is presenting with multiple day chest pain. Heparin per pharmacy consult placed for chest pain/ACS.  Patient is not on anticoagulation prior to arrival.  Heparin drip started 1200 uts/hr with initial heparin level 0.22 < goal.    Goal of Therapy:  Heparin level 0.3-0.7 units/ml Monitor platelets by anticoagulation protocol: Yes   Plan:  Increase  heparin infusion 1350 units/hr Daily heparin level and CBC  Monitor s/s bleeding    Bonnita Nasuti Pharm.D. CPP, BCPS Clinical Pharmacist [phone] 02/08/2022 9:49 PM   ED Clinical Pharmacist -  [phone]

## 2022-02-08 NOTE — H&P (Signed)
History and Physical    Patient: Rodney Farley MRN:[id number] DOB: 12/11/1931 DOA: 02/08/2022 DOS: the patient was seen and examined on 02/08/2022 PCP: Hunter, Stephen O, MD  Patient coming from: Home - lives with alone in a senior complex; NOK: Son, [phone] (or wife, Linda)   Chief Complaint: Chest pain  HPI: Rodney Farley Delafuente is a 88 y.o. male with medical history significant of chronic systolic CHF with AICD placement; BPH; COPD; CAD s/p stent; depression/anxiety; HTN; HLD; OSA; ESRD on MWF HD; and DM presenting with chest pain.  He reports that he couldn't stand the pain any longer - chest, L shoulder, L bicep.  He has been having "little attacks that were subdued by nitroglycerin tablets".  About 4AM Saturday "it started a really really wretched day" with attacks that were more intense.  They believe he was having angina but he thinks he then had a heart attack yesterday.  It was similar to prior index symptoms, had "serious ones not too long ago."  He took NTG all day yesterday he had another bad episode overnight and took NTG and neurontin with improved pain until it recurred again today.  He had a stent placed on 5/3 and another on 6/14. Moderate AS on recent cardiac scoring evaluation.  He did have normal HD Friday.     ER Course:  MIs and intermittent CP since May.  Worse this AM.  Took NTG and neurontin without improvement.  Went to DB and had troponin elevation.  Cardiology consulted and wants medicine admission.  On Heparin, possible repeat cath.     Review of Systems: As mentioned in the history of present illness. All other systems reviewed and are negative. Past Medical History:  Diagnosis Date   AICD (automatic cardioverter/defibrillator) present    Medtronic pacer   Anemia    Anxiety and depression    Arthritis    Bell's palsy    left side. after shingles episode   BPH associated with nocturia    Chronic systolic CHF (congestive heart failure) (HCC)     EF normalized by Echo 2019   COPD (chronic obstructive pulmonary disease) (HCC)    Severe   Coronary artery disease    a. s/p MI in 1994/1995 while in [place] s/p questionable PCI. 03/2015: progression of disease, for staged PCI.   Diabetic peripheral neuropathy (HCC)    GERD (gastroesophageal reflux disease)    Gout    Hard of hearing    B/L   History of chronic pancreatitis 07/23/2017   noted on CT abd/pelvis   History of shingles    Hypercholesterolemia    Hypertension    Obesity    Sleep apnea    "sleeps w/humidifyer when he panics and gets short of breath" (04/08/2015)   TIA (transient ischemic attack) X 3   Trigger middle finger of left hand    Type II diabetes mellitus (HCC)    Wears glasses    Wears hearing aid    Past Surgical History:  Procedure Laterality Date   APPENDECTOMY     AV FISTULA PLACEMENT Right 02/07/2019   Procedure: ARTERIOVENOUS (AV) FISTULA CREATION RIGHT UPPER ARM;  Surgeon: Cain, Brandon Christopher, MD;  Location: [facility];  Service: Vascular;  Laterality: Right;   BIV ICD GENERATOR CHANGEOUT N/A 10/09/2021   Procedure: BIV ICD GENERATOR CHANGEOUT;  Surgeon: Klein, Steven C, MD;  Location: [facility];  Service: Cardiovascular;  Laterality: N/A;   CARDIAC CATHETERIZATION N/A 03/29/2015   Procedure: Right/Left   Heart Cath and Coronary Angiography;  Surgeon: Kobey M Jordan, MD;  Location: [facility];  Service: Cardiovascular;  Laterality: N/A;   CARDIAC CATHETERIZATION  1995   "after my MI; put me on heart RX after cath"   CARDIAC CATHETERIZATION N/A 04/09/2015   Procedure: Coronary Stent Intervention;  Surgeon: Becky M Jordan, MD;  [facility]: [facility];  Service: Cardiovascular;  Laterality: N/A;   CATARACT EXTRACTION W/ INTRAOCULAR LENS  IMPLANT, BILATERAL     CHOLECYSTECTOMY N/A 10/13/2017   Procedure: LAPAROSCOPIC CHOLECYSTECTOMY WITH LYSIS OF ADHESIONS;  Surgeon: White, Christopher M, MD;  Location: [facility];  Service: General;   Laterality: N/A;   COLONOSCOPY     CORONARY ANGIOGRAPHY N/A 11/12/2021   Procedure: CORONARY ANGIOGRAPHY;  Surgeon: Arida, Muhammad A, MD;  [facility];  Service: Cardiovascular;  Laterality: N/A;   CORONARY ANGIOGRAPHY N/A 12/24/2021   Procedure: CORONARY ANGIOGRAPHY;  Surgeon: Kelly, Thomas A, MD;  [facility];  Service: Cardiovascular;  Laterality: N/A;   CORONARY BALLOON ANGIOPLASTY N/A 12/24/2021   Procedure: CORONARY BALLOON ANGIOPLASTY;  Surgeon: Kelly, Thomas A, MD;  [facility];  Service: Cardiovascular;  Laterality: N/A;   CORONARY STENT INTERVENTION N/A 11/12/2021   Procedure: CORONARY STENT INTERVENTION;  Surgeon: Arida, Muhammad A, MD;  [facility];  Service: Cardiovascular;  Laterality: N/A;  LAD   DENTAL SURGERY     EP IMPLANTABLE DEVICE N/A 09/23/2015   MDT CRT-D, Dr. Klein   HIATAL HERNIA REPAIR  1977   ILEOCECETOMY N/A 03/27/2017   Procedure: ILEOCECECTOMY;  Surgeon: White, Christopher M, MD;  Location: [facility];  Service: General;  Laterality: N/A;   INSERT / REPLACE / REMOVE PACEMAKER  07/2008   Complete heart block status post DDD with good function   INTRAVASCULAR IMAGING/OCT N/A 12/24/2021   Procedure: INTRAVASCULAR IMAGING/OCT;  Surgeon: Kelly, Thomas A, MD;  [facility];  Service: Cardiovascular;  Laterality: N/A;   IR FLUORO GUIDE CV LINE RIGHT  04/03/2019   IR US GUIDE VASC ACCESS RIGHT  04/03/2019   LAPAROTOMY N/A 03/27/2017   Procedure: EXPLORATORY LAPAROTOMY;  Surgeon: White, Christopher M, MD;  Location: [facility];  Service: General;  Laterality: N/A;   TONSILLECTOMY     UPPER GI ENDOSCOPY     Social History:  reports that he quit smoking about 14 years ago. His smoking use included cigarettes. He has a 81.00 pack-year smoking history. He has never been exposed to tobacco smoke. He has never used smokeless tobacco. He reports current alcohol use. He reports that he does not use  drugs.  Allergies  Allergen Reactions   Bee Venom Anaphylaxis   Lyrica [Pregabalin] Other (See Comments)    hallucinations   Prednisone Other (See Comments)    hallucinations   Zocor [Simvastatin] Nausea Only and Other (See Comments)    Headache with brand name only.  Can take the generic.    Family History  Problem Relation Age of Onset   Stroke Mother    Leukemia Father    Stroke Sister    Heart attack Brother     Prior to Admission medications   Medication Sig Start Date End Date Taking? Authorizing Provider  aspirin EC 81 MG tablet Take 81 mg by mouth daily.    [provider]  atorvastatin (LIPITOR) 80 MG tablet Take 1 tablet (80 mg total) by mouth daily. Patient taking differently: Take 80 mg by mouth at   bedtime. 11/14/21   Johnson, Kathleen R, PA-C  calcitRIOL (ROCALTROL) 0.25 MCG capsule Take 7 capsules (1.75 mcg total) by mouth every Monday, Wednesday, and Friday with hemodialysis. 01/26/22   Jordan, Kittredge M, MD  carbamide peroxide (DEBROX) 6.5 % OTIC solution Place 5 drops into both ears 2 (two) times daily as needed (earwax). 11/13/21   Crenshaw, Brian S, MD  Carboxymethylcellul-Glycerin (LUBRICATING EYE DROPS OP) Place 1 drop into both eyes 3 (three) times daily as needed (dry eyes).    [provider]  clopidogrel (PLAVIX) 75 MG tablet Take 1 tablet (75 mg total) by mouth daily with breakfast. 11/14/21   Johnson, Kathleen R, PA-C  erythromycin ophthalmic ointment Place 1 application into the left eye at bedtime.    [provider]  gabapentin (NEURONTIN) 300 MG capsule Take 1 capsule (300 mg total) by mouth 2 (two) times daily. 01/05/22   Crenshaw, Brian S, MD  glucose blood (FREESTYLE TEST STRIPS) test strip Use to check blood sugar daily 05/01/19   Hunter, Stephen O, MD  glucose monitoring kit (FREESTYLE) monitoring kit USE TO MONITOR BLOOD GLUCOSE AS DIRECTED 05/01/19   Hunter, Stephen O, MD  lidocaine (XYLOCAINE) 5 % ointment Apply 1 application  topically as needed. 02/06/21   Patel, Donika K, DO  Methoxy PEG-Epoetin Beta (MIRCERA IJ) as directed. 02/26/21 07/15/22  [provider]  midodrine (PROAMATINE) 10 MG tablet Take 1 tablet (10 mg) three times a week. Add 5 mg a night after dialysis as needed for low blood pressure. Patient taking differently: Take 5-10 mg by mouth See admin instructions. Take 1 tablet (10 mg) three times a week. Add 5 mg a night after dialysis as needed for low blood pressure. 12/03/21   Goodrich, Callie E, PA-C  multivitamin (RENA-VIT) TABS tablet Take 1 tablet by mouth daily. 08/22/21   Hunter, Stephen O, MD  nitroGLYCERIN (NITROSTAT) 0.4 MG SL tablet Place 1 tablet (0.4 mg total) under the tongue every 5 (five) minutes x 3 doses as needed for chest pain. 11/13/21   Johnson, Kathleen R, PA-C  oxymetazoline (AFRIN) 0.05 % nasal spray Place 1 spray into both nostrils 2 (two) times daily as needed for congestion. 12/29/21   Hunter, Stephen O, MD  pantoprazole (PROTONIX) 40 MG tablet Take 1 tablet by mouth daily. 11/14/21   Johnson, Kathleen R, PA-C  saccharomyces boulardii (FLORASTOR) 250 MG capsule Take 1 capsule (250 mg total) by mouth 2 (two) times daily. 04/08/19   Regalado, Belkys A, MD  sevelamer carbonate (RENVELA) 800 MG tablet Take 2 tablets (1,600 mg total) by mouth 3 (three) times daily with meals. 11/13/21   Crenshaw, Brian S, MD  silver sulfADIAZINE (SILVADENE) 1 % cream Apply pea-sized amount to wound daily. 10/01/20   Price, Michael J, DPM  triamcinolone cream (KENALOG) 0.1 % Apply 1 application topically as directed. 04/10/19   [provider]  vitamin B-12 (CYANOCOBALAMIN) 1000 MCG tablet Take 1,000 mcg by mouth daily.    [provider]    Physical Exam: Vitals:   02/08/22 1430 02/08/22 1500 02/08/22 1530 02/08/22 1600  BP: 118/74 135/68 126/73 133/73  Pulse: 80 80 80 79  Resp: 12 18 18 17  Temp:      TempSrc:      SpO2: 95% 100% 93% 95%  Weight:      Height:       General:   Appears calm and comfortable and is in NAD Eyes:  PERRL, EOMI, normal lids, iris ENT:     hard of hearing, grossly normal lips & tongue, mmm; artificial upper and absent lower dentition Neck:  no LAD, masses or thyromegaly Cardiovascular:  RRR, no m/r/g. No LE edema.  Respiratory:   CTA bilaterally with no wheezes/rales/rhonchi.  Normal respiratory effort. Abdomen:  soft, NT, ND Skin:  no rash or induration seen on limited exam Musculoskeletal:  grossly normal tone BUE/BLE, good ROM, no bony abnormality Psychiatric:  blunted mood and affect, speech fluent and appropriate, AOx3 Neurologic:  CN 2-12 grossly intact, moves all extremities in coordinated fashion   Radiological Exams on Admission: Independently reviewed - see discussion in A/P where applicable  DG Chest Port 1 View  Result Date: 02/08/2022 CLINICAL DATA:  A male at age 88 presents for evaluation of chest pain that began last night. EXAM: PORTABLE CHEST 1 VIEW COMPARISON:  January 21, 2022. FINDINGS: Multi lead pacer defibrillator remains in place, power pack over LEFT chest. EKG leads project over the chest. Cardiomediastinal contours again with cardiac enlargement not changed from previous imaging. Central pulmonary vascular engorgement without frank edema. No consolidation. No visible pneumothorax. On limited assessment no acute skeletal findings. IMPRESSION: No active disease. Electronically Signed   By: Geoffrey  Wile M.D.   On: 02/08/2022 10:43    EKG: Independently reviewed.   0951 - AV paced with rate 80; NSCSLT 1116-  AV paced with rate 81; NSCSLT   Labs on Admission: I have personally reviewed the available labs and imaging studies at the time of the admission.  Pertinent labs:    K+ 5.2 Glucose 179 BUN 31/Creatinine 7.49/GFR 6 Anion gap 19 HS troponin 464 -> 498; 73 on 7/12 INR 1   Assessment and Plan: Principal Problem:   NSTEMI (non-ST elevated myocardial infarction) (HCC) Active Problems:   Hypotension    Hyperlipidemia   Type II diabetes mellitus (HCC)   Obesity (BMI 30-39.9)   ESRD on dialysis (HCC)   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   Chronic systolic CHF (congestive heart failure) (HCC)    NSTEMI -Patient with prior admissions for NSTEMI with stent placement 5/3 and then in-stent thrombosis with repeat PCI and balloon angioplasty 6/14 presenting with substernal chest pain -Concerning for angina, elevated troponins indicate yet another NSTEMI -CXR unremarkable.   -Will admit to progressive care since the patient has positive troponins with angina necessitating acute intervention. -Cardiology consultation requested -Patient appears likely to need invasive evaluation (cardiac catheterization) based on concerning history, pertinent symptoms, elevated troponin  -He has been started on a heparin drip -Hold ASA, continue Plavix -NTG for symptom relief (although there is no mortality benefit) -Unable to tolerate beta blocker due to hypotension -Has moderate AS, should not be contributing to current presentation  HTN -> hypotension -No longer on HTN medications -Now takes Midodrine on HD days  HLD -Continue Lipitor  DM -Last A1c was 7.5 but he is not on medications -Will cover with very sensitive scale SSI for now -He appears likely to need some medication as an outpatient -Continue Neurontin  Chronic systolic CHF -Has AICD in place -Recent echo with ED 45-50% -Volume management with HD, son reports he no longer has LE edema  ESRD -Patient on chronic MWF HD -Nephrology prn order set utilized -He does not appear to be volume overloaded or otherwise in need of acute HD -Nephrology was notified that patient will need HD post-cath -Continue Calcitriol, Sevelamer, rena-Vit -Mild hyperkalemia, will give Lokelma tonight and in AM since this prevented patient from having cath last hospitalization    Obesity -Body mass index is 33.25 kg/m..  -Weight loss  should be encouraged -Outpatient PCP/bariatric medicine/bariatric surgery f/u encouraged   DNR -Most form was completed with Dr. Caryl Comes and specifically says that patient is DNR -Discussed with patient/family and they reported that the Most form expresses his desired wishes    Advance Care Planning:   Code Status: DNR   Consults: Cardiology; nephrology  DVT Prophylaxis: Heparin drip  Family Communication: Son was present throughout evaluation  Severity of Illness: The appropriate patient status for this patient is INPATIENT. Inpatient status is judged to be reasonable and necessary in order to provide the required intensity of service to ensure the patient's safety. [name] presenting symptoms, physical exam findings, and initial radiographic and laboratory data in the context of their chronic comorbidities is felt to place them at high risk for further clinical deterioration. Furthermore, it is not anticipated that the patient will be medically stable for discharge from the hospital within 2 midnights of admission.   * I certify that at the point of admission it is my clinical judgment that the patient will require inpatient hospital care spanning beyond 2 midnights from the point of admission due to high intensity of service, high risk for further deterioration and high frequency of surveillance required.*  Author: Karmen Bongo, MD 02/08/2022 5:08 PM  For on call review [email or web address].

## 2022-02-08 NOTE — Progress Notes (Signed)
Request to use the bathroom, patient was able to ambulate with walker to the bathroom, he states he was able to have a bowel movement. On his return back to bed, patient began to stumble, silent for a moment, then states he was having chest pain 3/10, and left arm pain 5/10. Returned back to bed. Denies nausea or diaphoresis.  Intervention: completed EKG, Administered Nitro, BP 154/86 prior to nitro. 126/56 pulse 81 (A/V paced). Placed BSC beside. Educated to not get up without help. He verbalizes understanding. Informed MD on call. Continue heparin, will contact provider with any further needs Post intervention, pain is gone in chest and arm.

## 2022-02-08 NOTE — ED Provider Notes (Signed)
[facility] EMERGENCY DEPT Provider Note   CSN: [id number] Arrival date & time: 02/08/22  5852     History  Chief Complaint  Patient presents with   Chest Pain    Rodney Farley is a 88 y.o. male.  The history is provided by the patient and medical records. No language interpreter was used.  Chest Pain Pain location:  L chest Pain quality: aching, crushing and pressure   Pain radiates to:  L shoulder and L arm Pain severity:  Severe Onset quality:  Gradual Duration:  2 days Timing:  Constant Progression:  Waxing and waning Chronicity:  Recurrent Relieved by:  Nothing Worsened by:  Nothing Ineffective treatments:  None tried Associated symptoms: shortness of breath   Associated symptoms: no abdominal pain, no altered mental status, no back pain, no cough, no diaphoresis, no dizziness, no fatigue, no headache, no lower extremity edema, no nausea, no near-syncope, no numbness, no palpitations, no vomiting and no weakness   Risk factors: coronary artery disease        Home Medications Prior to Admission medications   Medication Sig Start Date End Date Taking? Authorizing Provider  aspirin EC 81 MG tablet Take 81 mg by mouth daily.    [provider]  atorvastatin (LIPITOR) 80 MG tablet Take 1 tablet (80 mg total) by mouth daily. Patient taking differently: Take 80 mg by mouth at bedtime. 11/14/21   Margie Billet, PA-C  calcitRIOL (ROCALTROL) 0.25 MCG capsule Take 7 capsules (1.75 mcg total) by mouth every Monday, Wednesday, and Friday with hemodialysis. 01/26/22   [name], Ryun M, MD  carbamide peroxide (DEBROX) 6.5 % OTIC solution Place 5 drops into both ears 2 (two) times daily as needed (earwax). 11/13/21   Lelon Perla, MD  Carboxymethylcellul-Glycerin (LUBRICATING EYE DROPS OP) Place 1 drop into both eyes 3 (three) times daily as needed (dry eyes).    [provider]  clopidogrel (PLAVIX) 75 MG tablet Take 1 tablet (75 mg  total) by mouth daily with breakfast. 11/14/21   Margie Billet, PA-C  erythromycin ophthalmic ointment Place 1 application into the left eye at bedtime.    [provider]  gabapentin (NEURONTIN) 300 MG capsule Take 1 capsule (300 mg total) by mouth 2 (two) times daily. 01/05/22   Lelon Perla, MD  glucose blood (FREESTYLE TEST STRIPS) test strip Use to check blood sugar daily 05/01/19   [name], MD  glucose monitoring kit (FREESTYLE) monitoring kit USE TO MONITOR BLOOD GLUCOSE AS DIRECTED 05/01/19   [name], MD  lidocaine (XYLOCAINE) 5 % ointment Apply 1 application topically as needed. 02/06/21   Narda Amber K, DO  Methoxy PEG-Epoetin Beta (MIRCERA IJ) as directed. 02/26/21 07/15/22  [provider]  midodrine (PROAMATINE) 10 MG tablet Take 1 tablet (10 mg) three times a week. Add 5 mg a night after dialysis as needed for low blood pressure. Patient taking differently: Take 5-10 mg by mouth See admin instructions. Take 1 tablet (10 mg) three times a week. Add 5 mg a night after dialysis as needed for low blood pressure. 12/03/21   Sande Rives E, PA-C  multivitamin (RENA-VIT) TABS tablet Take 1 tablet by mouth daily. 08/22/21   [name], MD  nitroGLYCERIN (NITROSTAT) 0.4 MG SL tablet Place 1 tablet (0.4 mg total) under the tongue every 5 (five) minutes x 3 doses as needed for chest pain. 11/13/21   Margie Billet, PA-C  oxymetazoline (AFRIN) 0.05 %  nasal spray Place 1 spray into both nostrils 2 (two) times daily as needed for congestion. 12/29/21   [name], MD  pantoprazole (PROTONIX) 40 MG tablet Take 1 tablet by mouth daily. 11/14/21   Margie Billet, PA-C  saccharomyces boulardii (FLORASTOR) 250 MG capsule Take 1 capsule (250 mg total) by mouth 2 (two) times daily. 04/08/19   Regalado, Belkys A, MD  sevelamer carbonate (RENVELA) 800 MG tablet Take 2 tablets (1,600 mg total) by mouth 3 (three) times daily with meals. 11/13/21    Lelon Perla, MD  silver sulfADIAZINE (SILVADENE) 1 % cream Apply pea-sized amount to wound daily. 10/01/20   Evelina Bucy, DPM  triamcinolone cream (KENALOG) 0.1 % Apply 1 application topically as directed. 04/10/19   [provider]  vitamin B-12 (CYANOCOBALAMIN) 1000 MCG tablet Take 1,000 mcg by mouth daily.    [provider]      Allergies    Bee venom, Lyrica [pregabalin], Prednisone, and Zocor [simvastatin]    Review of Systems   Review of Systems  Constitutional:  Negative for chills, diaphoresis and fatigue.  HENT:  Negative for congestion.   Respiratory:  Positive for shortness of breath. Negative for cough, chest tightness and wheezing.   Cardiovascular:  Positive for chest pain. Negative for palpitations and near-syncope.  Gastrointestinal:  Negative for abdominal pain, constipation, diarrhea, nausea and vomiting.  Genitourinary:  Negative for flank pain.  Musculoskeletal:  Negative for back pain.  Neurological:  Negative for dizziness, weakness, light-headedness, numbness and headaches.  Psychiatric/Behavioral:  Negative for agitation.   All other systems reviewed and are negative.   Physical Exam Updated Vital Signs BP 133/67   Pulse 82   Temp 98.1 F (36.7 C) (Oral)   Resp 20   Ht 5' 7" (1.702 m)   Wt 96.3 kg   SpO2 100%   BMI 33.25 kg/m  Physical Exam Vitals and nursing note reviewed.  Constitutional:      General: He is not in acute distress.    Appearance: He is well-developed. He is not ill-appearing, toxic-appearing or diaphoretic.  HENT:     Head: Normocephalic and atraumatic.  Eyes:     Conjunctiva/sclera: Conjunctivae normal.  Cardiovascular:     Rate and Rhythm: Normal rate.     Heart sounds: Murmur heard.     Comments: paced Pulmonary:     Effort: Pulmonary effort is normal. No respiratory distress.     Breath sounds: Normal breath sounds. No wheezing, rhonchi or rales.  Chest:     Chest wall: No tenderness.   Abdominal:     Palpations: Abdomen is soft.     Tenderness: There is no abdominal tenderness.  Musculoskeletal:        General: No swelling.     Cervical back: Neck supple.     Right lower leg: No tenderness.     Left lower leg: No tenderness.  Skin:    General: Skin is warm and dry.     Capillary Refill: Capillary refill takes less than 2 seconds.     Findings: No erythema.  Neurological:     General: No focal deficit present.     Mental Status: He is alert.  Psychiatric:        Mood and Affect: Mood normal.     ED Results / Procedures / Treatments   Labs (all labs ordered are listed, but only abnormal results are displayed) Labs Reviewed  BASIC METABOLIC PANEL - Abnormal; Notable for the  following components:      Result Value   Potassium 5.2 (*)    Chloride 93 (*)    Glucose, Bld 179 (*)    BUN 31 (*)    Creatinine, Ser 7.49 (*)    Calcium 10.5 (*)    GFR, Estimated 6 (*)    Anion gap 19 (*)    All other components within normal limits  CBC - Abnormal; Notable for the following components:   RBC 3.18 (*)    Hemoglobin 11.3 (*)    HCT 33.4 (*)    MCV 105.0 (*)    MCH 35.5 (*)    All other components within normal limits  TROPONIN I (HIGH SENSITIVITY) - Abnormal; Notable for the following components:   Troponin I (High Sensitivity) 464 (*)    All other components within normal limits  TROPONIN I (HIGH SENSITIVITY) - Abnormal; Notable for the following components:   Troponin I (High Sensitivity) 498 (*)    All other components within normal limits  HEPATIC FUNCTION PANEL  LIPASE, BLOOD  PROTIME-INR  HEPARIN LEVEL (UNFRACTIONATED)    EKG EKG Interpretation  Date/Time:  Sunday February 08 2022 09:51:57 EDT Ventricular Rate:  80 PR Interval:  178 QRS Duration: 160 QT Interval:  456 QTC Calculation: 525 R Axis:   253 Text Interpretation: AV dual-paced rhythm Abnormal ECG No previous ECGs available when compared to prior, similar appearance. No STEMI Confirmed  by Antony Blackbird [phone]) [place] 02/08/2022 10:01:00 AM  Radiology DG Chest Port 1 View  Result Date: 02/08/2022 CLINICAL DATA:  A male at age 54 presents for evaluation of chest pain that began last night. EXAM: PORTABLE CHEST 1 VIEW COMPARISON:  January 21, 2022. FINDINGS: Multi lead pacer defibrillator remains in place, power pack over LEFT chest. EKG leads project over the chest. Cardiomediastinal contours again with cardiac enlargement not changed from previous imaging. Central pulmonary vascular engorgement without frank edema. No consolidation. No visible pneumothorax. Farley limited assessment no acute skeletal findings. IMPRESSION: No active disease. Electronically Signed   By: Zetta Bills M.D.   Farley: 02/08/2022 10:43    Procedures Procedures    CRITICAL CARE Performed by: Gwenyth Allegra Tegeler Total critical care time: 40 minutes Critical care time was exclusive of separately billable procedures and treating other patients. Critical care was necessary to treat or prevent imminent or life-threatening deterioration. Critical care was time spent personally by me Farley the following activities: development of treatment plan with patient and/or surrogate as well as nursing, discussions with consultants, evaluation of patient's response to treatment, examination of patient, obtaining history from patient or surrogate, ordering and performing treatments and interventions, ordering and review of laboratory studies, ordering and review of radiographic studies, pulse oximetry and re-evaluation of patient's condition.   Medications Ordered in ED Medications  nitroGLYCERIN (NITROSTAT) SL tablet 0.4 mg (0.4 mg Sublingual Given 02/08/22 1024)  nitroGLYCERIN (NITROSTAT) 0.4 MG SL tablet (has no administration in time range)  heparin ADULT infusion 100 units/mL (25000 units/276m) (1,200 Units/hr Intravenous New Bag/Given 02/08/22 1151)  atorvastatin (LIPITOR) tablet 80 mg (has no administration in time range)   calcitRIOL (ROCALTROL) capsule 1.75 mcg (has no administration in time range)  pantoprazole (PROTONIX) EC tablet 40 mg (has no administration in time range)  clopidogrel (PLAVIX) tablet 75 mg (has no administration in time range)  gabapentin (NEURONTIN) capsule 300 mg (has no administration in time range)  multivitamin (RENA-VIT) tablet 1 tablet (has no administration in time range)  carboxymethylcellul-glycerin (REFRESH OPTIVE) 0.5-0.9 %  ophthalmic solution 1 drop (has no administration in time range)  sodium chloride flush (NS) 0.9 % injection 3 mL (has no administration in time range)  hydrALAZINE (APRESOLINE) injection 5 mg (has no administration in time range)  acetaminophen (TYLENOL) tablet 650 mg (has no administration in time range)    Or  acetaminophen (TYLENOL) suppository 650 mg (has no administration in time range)  zolpidem (AMBIEN) tablet 5 mg (has no administration in time range)  sorbitol 70 % solution 30 mL (has no administration in time range)  docusate sodium (ENEMEEZ) enema 283 mg (has no administration in time range)  ondansetron (ZOFRAN) tablet 4 mg (has no administration in time range)    Or  ondansetron (ZOFRAN) injection 4 mg (has no administration in time range)  camphor-menthol (SARNA) lotion 1 Application (has no administration in time range)    And  hydrOXYzine (ATARAX) tablet 25 mg (has no administration in time range)  calcium carbonate (dosed in mg elemental calcium) suspension 500 mg of elemental calcium (has no administration in time range)  feeding supplement (NEPRO CARB STEADY) liquid 237 mL (has no administration in time range)  midodrine (PROAMATINE) tablet 10 mg (has no administration in time range)  midodrine (PROAMATINE) tablet 5 mg (has no administration in time range)  sevelamer carbonate (RENVELA) tablet 1,600 mg (has no administration in time range)  sevelamer carbonate (RENVELA) tablet 800 mg (has no administration in time range)  aspirin  chewable tablet 324 mg (324 mg Oral Given 02/08/22 1123)  clopidogrel (PLAVIX) tablet 75 mg (75 mg Oral Given 02/08/22 1122)    ED Course/ Medical Decision Making/ A&P                           Medical Decision Making Amount and/or Complexity of Data Reviewed Labs: ordered. Radiology: ordered.  Risk OTC drugs. Prescription drug management. Decision regarding hospitalization.    Rodney Farley is a 88 y.o. male with a past medical history significant for hypertension, hypercholesterolemia, complete heart block with pacemaker, diabetes, ESRD [facility] dialysis M/W/F, CHF, and recent NSTEMI last month who presents with several days of left-sided chest pressure.  According to patient this feels like his previous heart attack.  He reports it is a pain and pressure in his left chest that goes into his left shoulder and left arm.  He reports no nausea, vomiting, or diaphoresis but does report some shortness of breath.  He reports initially thought it was just mild angina but then it progressively worsened.  He reports it was up to a 10 out of 10 in severity.  He says that he took nitro at home and it improved slightly.  He reports that the pain is now moderate.  He did not take his aspirin or his Plavix this morning.  He denies trauma, fevers, chills, or cough.  Denies other complaints.  No abdominal pain or leg pains.  Farley exam, lungs clear and chest was nontender.  There was a slight murmur.  Abdomen nontender.  Good pulses in extremities.  Patient appears to the paced rhythm with no STEMI seen Farley initial EKG.  Due to patient's history I am concerned about recurrent NSTEMI.  Patient will have labs, chest x-ray, and work-up initiated.  Initial troponin came back in the 400s.  This is up from several weeks ago.  Clinically I am concerned about NSTEMI.  I called cardiology and they agree.  They requested we give 324 of aspirin, 75  of his Plavix, and start him Farley heparin.  They would like [place] to ED to  ED transfer that they can see him and discuss going to the Cath Lab again.  He will remain n.p.o.  Care excepted by Dr. Ronnald Nian for transfer to the emergency department.  Patient be transferred for further management.         Final Clinical Impression(s) / ED Diagnoses Final diagnoses:  NSTEMI (non-ST elevated myocardial infarction) ([place])  Elevated troponin  Left-sided chest pain    Clinical Impression: 1. NSTEMI (non-ST elevated myocardial infarction) ([facility])   2. Elevated troponin   3. Left-sided chest pain     Disposition: ED to ED transfer so patient can be seen by cardiology to discuss Cath Lab.  Patient's transfer accepted by Dr. Ronnald Nian in the emergency department  This note was prepared with assistance of Dragon voice recognition software. Occasional wrong-word or sound-a-like substitutions may have occurred due to the inherent limitations of voice recognition software.     Tegeler, Gwenyth Allegra, MD 02/08/22 [phone]

## 2022-02-08 NOTE — ED Notes (Signed)
Pt arrived via [facility] from our [facility] ED. Pt placed on cardiac monitor, oriented to room and RN call light. EDP at bedside. Pt currently has no CP.

## 2022-02-08 NOTE — Consult Note (Signed)
Cardiology Consultation:   Patient ID: Rodney Farley MRN: [id number]; DOB: 12/13/1931  Admit date: 02/08/2022 Date of Consult: 02/08/2022  PCP:  Hunter, Stephen O, MD   [facility] Providers Cardiologist:  Damauri Jordan, MD    Patient Profile:   Daisean Dixon Farley is a 88 y.o. male with a hx of chronic systolic heart failure, diabetes with neuropathy, coronary artery disease Status post non-STEMI who is being seen 02/08/2022 for the evaluation of non-STEMI at the request of Julie Haviland.  History of Present Illness:   Mr. Millstein has a long cardiac history of coronary artery disease and heart failure.  He has had multiple MIs and PCI's.  He had a non-STEMI 5-23 with drug-eluting stent to an ostial LAD.  He was readmitted to the hospital 12/23/2021 with non-STEMI and a troponin of 1000.  Left heart catheterization showed 90% in-stent restenosis of the LAD stent with thrombus and stent underexpansion at the ostium.  He had PCI with PCTA of the lesion.  Since that time, per cardiology notes he has continued to have angina.  Angina is typically relieved with sublingual nitroglycerin.  He does not have pain when he is at dialysis.  Over the last few days, his level of angina has significantly worsened.  He has been taking multiple nitroglycerin tablets, with brief resolution of his pain.  His pain occurs at rest.  He had troponins at [facility] which were found to be elevated at 414.  He was thus referred to [facility] for admission.   Past Medical History:  Diagnosis Date   AICD (automatic cardioverter/defibrillator) present    Anemia    Anxiety    Arthritis    Atrioventricular block, complete (HCC)    a. 2010 s/p pacemaker.   Bell's palsy    left side. after shingles episode   Bilateral renal cysts 07/23/2017   Simple and hemorrhagic noted on CT ab/pelvis    BPH associated with nocturia    Chronic systolic CHF (congestive heart failure) (HCC)    EF normalized  by Echo 2019   COPD (chronic obstructive pulmonary disease) (HCC)    Severe   Coronary artery disease    a. s/p MI in 1994/1995 while in [place] s/p questionable PCI. 03/2015: progression of disease, for staged PCI.   Depression    Diabetic peripheral neuropathy (HCC)    Diverticulitis    Full dentures    Gallstones    GERD (gastroesophageal reflux disease)    Gout    Hard of hearing    B/L   History of chronic pancreatitis 07/23/2017   noted on CT abd/pelvis   History of shingles    Hypercholesterolemia    Hypertension    Ischemic cardiomyopathy    MI (myocardial infarction) (HCC) 1994; 1995   Neuropathy    IN LOWER EXTREMITIES   Nosebleed 10/06/2017   for 2 months most recent 10/06/2017   Obesity    Pacemaker    medtronic>>> MDT ICD 09/23/15   Ruptured appendicitis    Sleep apnea    "sleeps w/humidifyer when he panics and gets short of breath" (04/08/2015)   TIA (transient ischemic attack) X 3   Trigger middle finger of left hand    Type II diabetes mellitus (HCC)    Wears glasses    Wears hearing aid     Past Surgical History:  Procedure Laterality Date   APPENDECTOMY     AV FISTULA PLACEMENT Right 02/07/2019   Procedure: ARTERIOVENOUS (AV) FISTULA   CREATION RIGHT UPPER ARM;  Surgeon: Waynetta Sandy, MD;  Location: [facility];  Service: Vascular;  Laterality: Right;   BIV ICD GENERATOR CHANGEOUT N/A 10/09/2021   Procedure: BIV ICD GENERATOR CHANGEOUT;  Surgeon: Deboraha Sprang, MD;  Location: [facility];  Service: Cardiovascular;  Laterality: N/A;   CARDIAC CATHETERIZATION N/A 03/29/2015   Procedure: Right/Left Heart Cath and Coronary Angiography;  Surgeon: Dimitrious M [name], MD;  Location: [facility];  Service: Cardiovascular;  Laterality: N/A;   [place]   "after my MI; put me on heart RX after cath"   CARDIAC CATHETERIZATION N/A 04/09/2015   Procedure: Coronary Stent Intervention;  Surgeon: Bryann M [name], MD;  Location: [facility];  Service: Cardiovascular;  Laterality: N/A;   CATARACT EXTRACTION W/ INTRAOCULAR LENS  IMPLANT, BILATERAL     CHOLECYSTECTOMY N/A 10/13/2017   Procedure: LAPAROSCOPIC CHOLECYSTECTOMY WITH LYSIS OF ADHESIONS;  Surgeon: Ileana Roup, MD;  Location: [facility];  Service: General;  Laterality: N/A;   COLONOSCOPY     CORONARY ANGIOGRAPHY N/A 11/12/2021   Procedure: CORONARY ANGIOGRAPHY;  Surgeon: Wellington Hampshire, MD;  Location: [facility];  Service: Cardiovascular;  Laterality: N/A;   CORONARY ANGIOGRAPHY N/A 12/24/2021   Procedure: CORONARY ANGIOGRAPHY;  Surgeon: Troy Sine, MD;  Location: [facility];  Service: Cardiovascular;  Laterality: N/A;   CORONARY BALLOON ANGIOPLASTY N/A 12/24/2021   Procedure: CORONARY BALLOON ANGIOPLASTY;  Surgeon: Troy Sine, MD;  Location: [facility] [facility];  Service: Cardiovascular;  Laterality: N/A;   CORONARY STENT INTERVENTION N/A 11/12/2021   Procedure: CORONARY STENT INTERVENTION;  Surgeon: Wellington Hampshire, MD;  Location: [facility];  Service: Cardiovascular;  Laterality: N/A;  LAD   DENTAL SURGERY     EP IMPLANTABLE DEVICE N/A 09/23/2015   MDT CRT-D, Dr. Caryl Comes   HIATAL HERNIA REPAIR  1977   ILEOCECETOMY N/A 03/27/2017   Procedure: ILEOCECECTOMY;  Surgeon: Ileana Roup, MD;  Location: [facility];  Service: General;  Laterality: N/A;   INSERT / REPLACE / REMOVE PACEMAKER  07/2008   Complete heart block status post DDD with good function   INTRAVASCULAR IMAGING/OCT N/A 12/24/2021   Procedure: INTRAVASCULAR IMAGING/OCT;  Surgeon: Troy Sine, MD;  Location: [facility];  Service: Cardiovascular;  Laterality: N/A;   IR FLUORO GUIDE CV LINE RIGHT  04/03/2019   IR US GUIDE VASC ACCESS RIGHT  04/03/2019   LAPAROTOMY N/A 03/27/2017   Procedure: EXPLORATORY LAPAROTOMY;  Surgeon: Ileana Roup, MD;  Location: [facility];  Service: General;  Laterality: N/A;   TONSILLECTOMY     UPPER GI ENDOSCOPY        Home Medications:  Prior to Admission medications   Medication Sig Start Date End Date Taking? Authorizing Provider  aspirin EC 81 MG tablet Take 81 mg by mouth daily.    [provider]  atorvastatin (LIPITOR) 80 MG tablet Take 1 tablet (80 mg total) by mouth daily. Patient taking differently: Take 80 mg by mouth at bedtime. 11/14/21   Margie Billet, PA-C  calcitRIOL (ROCALTROL) 0.25 MCG capsule Take 7 capsules (1.75 mcg total) by mouth every Monday, Wednesday, and Friday with hemodialysis. 01/26/22   [name], Manish M, MD  carbamide peroxide (DEBROX) 6.5 % OTIC solution Place 5 drops into both ears 2 (two) times daily as needed (earwax). 11/13/21   Lelon Perla, MD  Carboxymethylcellul-Glycerin (LUBRICATING EYE DROPS OP) Place 1  drop into both eyes 3 (three) times daily as needed (dry eyes).    [provider]  clopidogrel (PLAVIX) 75 MG tablet Take 1 tablet (75 mg total) by mouth daily with breakfast. 11/14/21   Johnson, Kathleen R, PA-C  erythromycin ophthalmic ointment Place 1 application into the left eye at bedtime.    [provider]  gabapentin (NEURONTIN) 300 MG capsule Take 1 capsule (300 mg total) by mouth 2 (two) times daily. 01/05/22   Crenshaw, Brian S, MD  glucose blood (FREESTYLE TEST STRIPS) test strip Use to check blood sugar daily 05/01/19   Hunter, Stephen O, MD  glucose monitoring kit (FREESTYLE) monitoring kit USE TO MONITOR BLOOD GLUCOSE AS DIRECTED 05/01/19   Hunter, Stephen O, MD  lidocaine (XYLOCAINE) 5 % ointment Apply 1 application topically as needed. 02/06/21   Patel, Donika K, DO  Methoxy PEG-Epoetin Beta (MIRCERA IJ) as directed. 02/26/21 07/15/22  [provider]  midodrine (PROAMATINE) 10 MG tablet Take 1 tablet (10 mg) three times a week. Add 5 mg a night after dialysis as needed for low blood pressure. Patient taking differently: Take 5-10 mg by mouth See admin instructions. Take 1 tablet (10 mg) three times a week. Add 5 mg a  night after dialysis as needed for low blood pressure. 12/03/21   Goodrich, Callie E, PA-C  multivitamin (RENA-VIT) TABS tablet Take 1 tablet by mouth daily. 08/22/21   Hunter, Stephen O, MD  nitroGLYCERIN (NITROSTAT) 0.4 MG SL tablet Place 1 tablet (0.4 mg total) under the tongue every 5 (five) minutes x 3 doses as needed for chest pain. 11/13/21   Johnson, Kathleen R, PA-C  oxymetazoline (AFRIN) 0.05 % nasal spray Place 1 spray into both nostrils 2 (two) times daily as needed for congestion. 12/29/21   Hunter, Stephen O, MD  pantoprazole (PROTONIX) 40 MG tablet Take 1 tablet by mouth daily. 11/14/21   Johnson, Kathleen R, PA-C  saccharomyces boulardii (FLORASTOR) 250 MG capsule Take 1 capsule (250 mg total) by mouth 2 (two) times daily. 04/08/19   Regalado, Belkys A, MD  sevelamer carbonate (RENVELA) 800 MG tablet Take 2 tablets (1,600 mg total) by mouth 3 (three) times daily with meals. 11/13/21   Crenshaw, Brian S, MD  silver sulfADIAZINE (SILVADENE) 1 % cream Apply pea-sized amount to wound daily. 10/01/20   Price, Michael J, DPM  triamcinolone cream (KENALOG) 0.1 % Apply 1 application topically as directed. 04/10/19   [provider]  vitamin B-12 (CYANOCOBALAMIN) 1000 MCG tablet Take 1,000 mcg by mouth daily.    [provider]    Inpatient Medications: Scheduled Meds:  nitroGLYCERIN       Continuous Infusions:  heparin 1,200 Units/hr (02/08/22 1151)   PRN Meds: nitroGLYCERIN, nitroGLYCERIN  Allergies:    Allergies  Allergen Reactions   Bee Venom Anaphylaxis   Lyrica [Pregabalin] Other (See Comments)    hallucinations   Prednisone Other (See Comments)    hallucinations   Zocor [Simvastatin] Nausea Only and Other (See Comments)    Headache with brand name only.  Can take the generic.    Social History:   Social History   Socioeconomic History   Marital status: Legally Separated    Spouse name: Not on file   Number of children: 1   Years of education: college    Highest education level: Not on file  Occupational History   Occupation: Retired  Tobacco Use   Smoking status: Former    Packs/day: 1.50    Years:   54.00    Total pack years: 81.00    Types: Cigarettes    Quit date: 07/18/2007    Years since quitting: 14.5    Passive exposure: Never   Smokeless tobacco: Never  Vaping Use   Vaping Use: Never used  Substance and Sexual Activity   Alcohol use: Yes    Comment: rare   Drug use: No   Sexual activity: Never  Other Topics Concern   Not on file  Social History Narrative   Married 1994 (together since 1989) 1 son, 1 stepson. 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       Faith: Mormon      Here on Green Card since 2004 from [place]; wife recently left and relocated to [place]    Social Determinants of Health   Financial Resource Strain: Not on file  Food Insecurity: Not on file  Transportation Needs: Not on file  Physical Activity: Not on file  Stress: Not on file  Social Connections: Not on file  Intimate Partner Violence: Not on file    Family History:    Family History  Problem Relation Age of Onset   Stroke Mother    Leukemia Father    Stroke Sister    Heart attack Brother      ROS:  Please see the history of present illness.   All other ROS reviewed and negative.     Physical Exam/Data:   Vitals:   02/08/22 1017 02/08/22 1125 02/08/22 1130 02/08/22 1235  BP:  103/60 125/66 (!) 146/72  Pulse:  81 79 84  Resp:  19 14 17  Temp: 98.1 F (36.7 C)   98.3 F (36.8 C)  TempSrc: Oral   Oral  SpO2:  95% 97% 96%  Weight:      Height:       No intake or output data in the 24 hours ending 02/08/22 1338    02/08/2022    9:57 AM 01/26/2022    3:01 PM 01/21/2022    5:52 PM  Last 3 Weights  Weight (lbs) 212 lb 4.9 oz 212 lb 6.4 oz 216 lb 14.9 oz  Weight (kg) 96.3 kg 96.344 kg 98.4 kg     Body mass index is 33.25 kg/m.  General:  Well nourished, well  developed, in no acute distress HEENT: normal Neck: no JVD Vascular: No carotid bruits; Distal pulses 2+ bilaterally Cardiac:  normal S1, S2; RRR; no murmur  Lungs:  clear to auscultation bilaterally, no wheezing, rhonchi or rales  Abd: soft, nontender, no hepatomegaly  Ext: no edema Musculoskeletal:  No deformities, BUE and BLE strength normal and equal Skin: warm and dry  Neuro:  CNs 2-12 intact, no focal abnormalities noted Psych:  Normal affect   EKG:  The EKG was personally reviewed and demonstrates:  AV paced Telemetry:  Telemetry was personally reviewed and demonstrates:  AV paced  Relevant CV Studies: LHC 12/24/21   Dist LM to Ost LAD lesion is 90% stenosed.   Prox Cx lesion is 30% stenosed.   Mid Cx to Dist Cx lesion is 40% stenosed.   3rd Mrg lesion is 50% stenosed.   Mid Cx lesion is 30% stenosed.   Ost RCA to Prox RCA lesion is 70% stenosed.   Previously placed 2nd Diag stent of unknown type is  widely patent.   Post intervention, there is a 0% residual stenosis.  TTE 11/11/21  1.   Left ventricular ejection fraction, by estimation, is 45 to 50%. The  left ventricle has mildly decreased function. The left ventricle  demonstrates regional wall motion abnormalities (see scoring  diagram/findings for description). The left ventricular   internal cavity size was mildly dilated. There is mild left ventricular  hypertrophy. Left ventricular diastolic parameters are consistent with  Grade I diastolic dysfunction (impaired relaxation).   2. Right ventricular systolic function is normal. The right ventricular  size is normal. Tricuspid regurgitation signal is inadequate for assessing  PA pressure.   3. Left atrial size was moderately dilated.   4. The mitral valve is degenerative. Trivial mitral valve regurgitation.  No evidence of mitral stenosis.   5. The aortic valve was not well visualized. Aortic valve regurgitation  is not visualized. Moderate aortic valve stenosis. Vmax  2.9 m/s, MG  54mHg, AVA 1.2 cm^2, DI 0.38   Laboratory Data:  High Sensitivity Troponin:   Recent Labs  Lab 01/21/22 1200 01/21/22 1803 01/21/22 1957 02/08/22 0956  TROPONINIHS 59* 65* 73* 464*     Chemistry Recent Labs  Lab 02/08/22 0956  NA 140  K 5.2*  CL 93*  CO2 28  GLUCOSE 179*  BUN 31*  CREATININE 7.49*  CALCIUM 10.5*  GFRNONAA 6*  ANIONGAP 19*    Recent Labs  Lab 02/08/22 0956  PROT 7.0  ALBUMIN 4.4  AST 17  ALT 25  ALKPHOS 81  BILITOT 0.4   Lipids No results for input(s): "CHOL", "TRIG", "HDL", "LABVLDL", "LDLCALC", "CHOLHDL" in the last 168 hours.  Hematology Recent Labs  Lab 02/08/22 0956  WBC 9.9  RBC 3.18*  HGB 11.3*  HCT 33.4*  MCV 105.0*  MCH 35.5*  MCHC 33.8  RDW 14.9  PLT 166   Thyroid No results for input(s): "TSH", "FREET4" in the last 168 hours.  BNPNo results for input(s): "BNP", "PROBNP" in the last 168 hours.  DDimer No results for input(s): "DDIMER" in the last 168 hours.   Radiology/Studies:  DG Chest Port 1 View  Result Date: 02/08/2022 CLINICAL DATA:  A male at age [age]presents for evaluation of chest pain that began last night. EXAM: PORTABLE CHEST 1 VIEW COMPARISON:  January 21, 2022. FINDINGS: Multi lead pacer defibrillator remains in place, power pack over LEFT chest. EKG leads project over the chest. Cardiomediastinal contours again with cardiac enlargement not changed from previous imaging. Central pulmonary vascular engorgement without frank edema. No consolidation. No visible pneumothorax. On limited assessment no acute skeletal findings. IMPRESSION: No active disease. Electronically Signed   By: GZetta BillsM.D.   On: 02/08/2022 10:43     Assessment and Plan:   Non-STEMI: Patient had pain with significant troponin elevation.  He has been started on IV heparin with improvement in the symptoms.  He has been given his dual antiplatelet therapy as well.  With his elevated troponin, history of coronary artery disease,  he would benefit from left heart catheterization to ensure that there are no issues with his current stent and to ensure that his 70% RCA lesion has not progressed.  We Dymon Summerhill plan for left heart catheterization tomorrow.  We Jacque Byron order sublingual nitroglycerin as needed.  If he develops significant continued chest pain not relieved by nitroglycerin or if he is having to take multiple tablets, nitroglycerin drip would be reasonable. Chronic systolic heart failure due to ischemic cardiomyopathy: Volume overload on chest x-ray.  Volume to be managed by nephrology with dialysis. End-stage renal disease: Dialysis Monday Wednesday Friday.  For questions or updates, please contact [facility] Please consult [email or web address] for contact info under    Signed, [facility] Martin Dominyk Law, MD  02/08/2022 1:38 PM  

## 2022-02-08 NOTE — H&P (View-Only) (Signed)
Cardiology Consultation:   Patient ID: Torion Hulgan MRN: [id number]; DOB: 1931/10/30  Admit date: 02/08/2022 Date of Consult: 02/08/2022  PCP:  [name], MD   [facility] Providers Cardiologist:  Linley [name], MD    Patient Profile:   Rodney Farley is a 88 y.o. male with a hx of chronic systolic heart failure, diabetes with neuropathy, coronary artery disease Status post non-STEMI who is being seen 02/08/2022 for the evaluation of non-STEMI at the request of Isla Pence.  History of Present Illness:   Mr. Justiss has a long cardiac history of coronary artery disease and heart failure.  He has had multiple MIs and PCI's.  He had a non-STEMI 5-23 with drug-eluting stent to an ostial LAD.  He was readmitted to the hospital 12/23/2021 with non-STEMI and a troponin of 1000.  Left heart catheterization showed 90% in-stent restenosis of the LAD stent with thrombus and stent underexpansion at the ostium.  He had PCI with PCTA of the lesion.  Since that time, per cardiology notes he has continued to have angina.  Angina is typically relieved with sublingual nitroglycerin.  He does not have pain when he is at dialysis.  Over the last few days, his level of angina has significantly worsened.  He has been taking multiple nitroglycerin tablets, with brief resolution of his pain.  His pain occurs at rest.  He had troponins at [facility] room which were found to be elevated at 414.  He was thus referred to [facility] for admission.   Past Medical History:  Diagnosis Date   AICD (automatic cardioverter/defibrillator) present    Anemia    Anxiety    Arthritis    Atrioventricular block, complete ([place])    a. 2010 s/p pacemaker.   Bell's palsy    left side. after shingles episode   Bilateral renal cysts 07/23/2017   Simple and hemorrhagic noted on CT ab/pelvis    BPH associated with nocturia    Chronic systolic CHF (congestive heart failure) ([place])    EF normalized  by Echo 2019   COPD (chronic obstructive pulmonary disease) ([place])    Severe   Coronary artery disease    a. s/p MI in 1994/1995 while in [place] s/p questionable PCI. 03/2015: progression of disease, for staged PCI.   Depression    Diabetic peripheral neuropathy (HCC)    Diverticulitis    Full dentures    Gallstones    GERD (gastroesophageal reflux disease)    Gout    Hard of hearing    B/L   History of chronic pancreatitis 07/23/2017   noted on CT abd/pelvis   History of shingles    Hypercholesterolemia    Hypertension    Ischemic cardiomyopathy    MI (myocardial infarction) ([place]) 1994; 1995   Neuropathy    IN LOWER EXTREMITIES   Nosebleed 10/06/2017   for 2 months most recent 10/06/2017   Obesity    Pacemaker    medtronic>>> MDT ICD 09/23/15   Ruptured appendicitis    Sleep apnea    "sleeps w/humidifyer when he panics and gets short of breath" (04/08/2015)   TIA (transient ischemic attack) X 3   Trigger middle finger of left hand    Type II diabetes mellitus ([place])    Wears glasses    Wears hearing aid     Past Surgical History:  Procedure Laterality Date   APPENDECTOMY     AV FISTULA PLACEMENT Right 02/07/2019   Procedure: ARTERIOVENOUS (AV) FISTULA  CREATION RIGHT UPPER ARM;  Surgeon: Waynetta Sandy, MD;  Location: [place];  Service: Vascular;  Laterality: Right;   BIV ICD GENERATOR CHANGEOUT N/A 10/09/2021   Procedure: BIV ICD GENERATOR CHANGEOUT;  Surgeon: Deboraha Sprang, MD;  Location: [place] [facility];  Service: Cardiovascular;  Laterality: N/A;   CARDIAC CATHETERIZATION N/A 03/29/2015   Procedure: Right/Left Heart Cath and Coronary Angiography;  Surgeon: Dimitrious M [name], MD;  Location: [place] [facility];  Service: Cardiovascular;  Laterality: N/A;   [place]   "after my MI; put me on heart RX after cath"   CARDIAC CATHETERIZATION N/A 04/09/2015   Procedure: Coronary Stent Intervention;  Surgeon: Bryann M [name], MD;  Location: [place] [facility];  Service: Cardiovascular;  Laterality: N/A;   CATARACT EXTRACTION W/ INTRAOCULAR LENS  IMPLANT, BILATERAL     CHOLECYSTECTOMY N/A 10/13/2017   Procedure: LAPAROSCOPIC CHOLECYSTECTOMY WITH LYSIS OF ADHESIONS;  Surgeon: Ileana Roup, MD;  Location: [facility];  Service: General;  Laterality: N/A;   COLONOSCOPY     CORONARY ANGIOGRAPHY N/A 11/12/2021   Procedure: CORONARY ANGIOGRAPHY;  Surgeon: Wellington Hampshire, MD;  Location: [place] [facility];  Service: Cardiovascular;  Laterality: N/A;   CORONARY ANGIOGRAPHY N/A 12/24/2021   Procedure: CORONARY ANGIOGRAPHY;  Surgeon: Troy Sine, MD;  Location: [place] [facility];  Service: Cardiovascular;  Laterality: N/A;   CORONARY BALLOON ANGIOPLASTY N/A 12/24/2021   Procedure: CORONARY BALLOON ANGIOPLASTY;  Surgeon: Troy Sine, MD;  Location: [place] [facility];  Service: Cardiovascular;  Laterality: N/A;   CORONARY STENT INTERVENTION N/A 11/12/2021   Procedure: CORONARY STENT INTERVENTION;  Surgeon: Wellington Hampshire, MD;  Location: [place] [facility];  Service: Cardiovascular;  Laterality: N/A;  LAD   DENTAL SURGERY     EP IMPLANTABLE DEVICE N/A 09/23/2015   MDT CRT-D, Dr. Caryl Comes   HIATAL HERNIA REPAIR  1977   ILEOCECETOMY N/A 03/27/2017   Procedure: ILEOCECECTOMY;  Surgeon: Ileana Roup, MD;  Location: [place];  Service: General;  Laterality: N/A;   INSERT / REPLACE / REMOVE PACEMAKER  07/2008   Complete heart block status post DDD with good function   INTRAVASCULAR IMAGING/OCT N/A 12/24/2021   Procedure: INTRAVASCULAR IMAGING/OCT;  Surgeon: Troy Sine, MD;  Location: [place] [facility];  Service: Cardiovascular;  Laterality: N/A;   IR FLUORO GUIDE CV LINE RIGHT  04/03/2019   IR US GUIDE VASC ACCESS RIGHT  04/03/2019   LAPAROTOMY N/A 03/27/2017   Procedure: EXPLORATORY LAPAROTOMY;  Surgeon: Ileana Roup, MD;  Location: [facility] [place];  Service: General;  Laterality: N/A;   TONSILLECTOMY     UPPER GI ENDOSCOPY        Home Medications:  Prior to Admission medications   Medication Sig Start Date End Date Taking? Authorizing Provider  aspirin EC 81 MG tablet Take 81 mg by mouth daily.    [provider]  atorvastatin (LIPITOR) 80 MG tablet Take 1 tablet (80 mg total) by mouth daily. Patient taking differently: Take 80 mg by mouth at bedtime. 11/14/21   Margie Billet, PA-C  calcitRIOL (ROCALTROL) 0.25 MCG capsule Take 7 capsules (1.75 mcg total) by mouth every Monday, Wednesday, and Friday with hemodialysis. 01/26/22   [name], Manish M, MD  carbamide peroxide (DEBROX) 6.5 % OTIC solution Place 5 drops into both ears 2 (two) times daily as needed (earwax). 11/13/21   Lelon Perla, MD  Carboxymethylcellul-Glycerin (LUBRICATING EYE DROPS OP) Place 1  drop into both eyes 3 (three) times daily as needed (dry eyes).    [provider]  clopidogrel (PLAVIX) 75 MG tablet Take 1 tablet (75 mg total) by mouth daily with breakfast. 11/14/21   Margie Billet, PA-C  erythromycin ophthalmic ointment Place 1 application into the left eye at bedtime.    [provider]  gabapentin (NEURONTIN) 300 MG capsule Take 1 capsule (300 mg total) by mouth 2 (two) times daily. 01/05/22   Lelon Perla, MD  glucose blood (FREESTYLE TEST STRIPS) test strip Use to check blood sugar daily 05/01/19   [name], MD  glucose monitoring kit (FREESTYLE) monitoring kit USE TO MONITOR BLOOD GLUCOSE AS DIRECTED 05/01/19   [name], MD  lidocaine (XYLOCAINE) 5 % ointment Apply 1 application topically as needed. 02/06/21   Narda Amber K, DO  Methoxy PEG-Epoetin Beta (MIRCERA IJ) as directed. 02/26/21 07/15/22  [provider]  midodrine (PROAMATINE) 10 MG tablet Take 1 tablet (10 mg) three times a week. Add 5 mg a night after dialysis as needed for low blood pressure. Patient taking differently: Take 5-10 mg by mouth See admin instructions. Take 1 tablet (10 mg) three times a week. Add 5 mg a  night after dialysis as needed for low blood pressure. 12/03/21   Sande Rives E, PA-C  multivitamin (RENA-VIT) TABS tablet Take 1 tablet by mouth daily. 08/22/21   [name], MD  nitroGLYCERIN (NITROSTAT) 0.4 MG SL tablet Place 1 tablet (0.4 mg total) under the tongue every 5 (five) minutes x 3 doses as needed for chest pain. 11/13/21   Margie Billet, PA-C  oxymetazoline (AFRIN) 0.05 % nasal spray Place 1 spray into both nostrils 2 (two) times daily as needed for congestion. 12/29/21   [name], MD  pantoprazole (PROTONIX) 40 MG tablet Take 1 tablet by mouth daily. 11/14/21   Margie Billet, PA-C  saccharomyces boulardii (FLORASTOR) 250 MG capsule Take 1 capsule (250 mg total) by mouth 2 (two) times daily. 04/08/19   Regalado, Belkys A, MD  sevelamer carbonate (RENVELA) 800 MG tablet Take 2 tablets (1,600 mg total) by mouth 3 (three) times daily with meals. 11/13/21   Lelon Perla, MD  silver sulfADIAZINE (SILVADENE) 1 % cream Apply pea-sized amount to wound daily. 10/01/20   Evelina Bucy, DPM  triamcinolone cream (KENALOG) 0.1 % Apply 1 application topically as directed. 04/10/19   [provider]  vitamin B-12 (CYANOCOBALAMIN) 1000 MCG tablet Take 1,000 mcg by mouth daily.    [provider]    Inpatient Medications: Scheduled Meds:  nitroGLYCERIN       Continuous Infusions:  heparin 1,200 Units/hr (02/08/22 1151)   PRN Meds: nitroGLYCERIN, nitroGLYCERIN  Allergies:    Allergies  Allergen Reactions   Bee Venom Anaphylaxis   Lyrica [Pregabalin] Other (See Comments)    hallucinations   Prednisone Other (See Comments)    hallucinations   Zocor [Simvastatin] Nausea Only and Other (See Comments)    Headache with brand name only.  Can take the generic.    Social History:   Social History   Socioeconomic History   Marital status: Legally Separated    Spouse name: Not on file   Number of children: 1   Years of education: college    Highest education level: Not on file  Occupational History   Occupation: Retired  Tobacco Use   Smoking status: Former    Packs/day: 1.50    Years:  54.00    Total pack years: 81.00    Types: Cigarettes    Quit date: 07/18/2007    Years since quitting: 14.5    Passive exposure: Never   Smokeless tobacco: Never  Vaping Use   Vaping Use: Never used  Substance and Sexual Activity   Alcohol use: Yes    Comment: rare   Drug use: No   Sexual activity: Never  Other Topics Concern   Not on file  Social History Narrative   Married 1994 (together since 1989) 1 son, 1 stepson. 3 grandkids, 6 great grandkids      Retired from [employer]. Had 2 years of collge.       Faith: Mormon      Here on [address] [employer] since 2004 from [place]; wife recently left and relocated to [facility]    Social Determinants of Health   Financial Resource Strain: Not on [employer] Insecurity: Not on file  Transportation Needs: Not on file  Physical Activity: Not on file  Stress: Not on file  Social Connections: Not on file  Intimate Partner Violence: Not on file    Family History:    Family History  Problem Relation Age of Onset   Stroke Mother    Leukemia Father    Stroke Sister    Heart attack Brother      ROS:  Please see the history of present illness.   All other ROS reviewed and negative.     Physical Exam/Data:   Vitals:   02/08/22 1017 02/08/22 1125 02/08/22 1130 02/08/22 1235  BP:  103/60 125/66 (!) 146/72  Pulse:  81 79 84  Resp:  _0 Temp: 98.1 F (36.7 C)   98.3 F (36.8 C)  TempSrc: Oral   Oral  SpO2:  95% 97% 96%  Weight:      Height:       No intake or output data in the 24 hours ending 02/08/22 1338    02/08/2022    9:57 AM 01/26/2022    3:01 PM 01/21/2022    5:52 PM  Last 3 Weights  Weight (lbs) 212 lb 4.9 oz 212 lb 6.4 oz 216 lb 14.9 oz  Weight (kg) 96.3 kg 96.344 kg 98.4 kg     Body mass index is 33.25 kg/m.  General:  Well nourished, well  developed, in no acute distress HEENT: normal Neck: no JVD Vascular: No carotid bruits; Distal pulses 2+ bilaterally Cardiac:  normal S1, S2; RRR; no murmur  Lungs:  clear to auscultation bilaterally, no wheezing, rhonchi or rales  Abd: soft, nontender, no hepatomegaly  Ext: no edema Musculoskeletal:  No deformities, BUE and BLE strength normal and equal Skin: warm and dry  Neuro:  CNs 2-12 intact, no focal abnormalities noted Psych:  Normal affect   EKG:  The EKG was personally reviewed and demonstrates:  AV paced Telemetry:  Telemetry was personally reviewed and demonstrates:  AV paced  Relevant CV Studies: LHC 12/24/21   Dist LM to Ost LAD lesion is 90% stenosed.   Prox Cx lesion is 30% stenosed.   Mid Cx to Dist Cx lesion is 40% stenosed.   3rd Mrg lesion is 50% stenosed.   Mid Cx lesion is 30% stenosed.   Ost RCA to Prox RCA lesion is 70% stenosed.   Previously placed 2nd Diag stent of unknown type is  widely patent.   Post intervention, there is a 0% residual stenosis.  TTE 11/11/21  1.  Left ventricular ejection fraction, by estimation, is 45 to 50%. The  left ventricle has mildly decreased function. The left ventricle  demonstrates regional wall motion abnormalities (see scoring  diagram/findings for description). The left ventricular   internal cavity size was mildly dilated. There is mild left ventricular  hypertrophy. Left ventricular diastolic parameters are consistent with  Grade I diastolic dysfunction (impaired relaxation).   2. Right ventricular systolic function is normal. The right ventricular  size is normal. Tricuspid regurgitation signal is inadequate for assessing  PA pressure.   3. Left atrial size was moderately dilated.   4. The mitral valve is degenerative. Trivial mitral valve regurgitation.  No evidence of mitral stenosis.   5. The aortic valve was not well visualized. Aortic valve regurgitation  is not visualized. Moderate aortic valve stenosis. Vmax  2.9 m/s, MG  28mHg, AVA 1.2 cm^2, DI 0.38   Laboratory Data:  High Sensitivity Troponin:   Recent Labs  Lab 01/21/22 1200 01/21/22 1803 01/21/22 1957 02/08/22 0956  TROPONINIHS 59* 65* 73* 464*     Chemistry Recent Labs  Lab 02/08/22 0956  NA 140  K 5.2*  CL 93*  CO2 28  GLUCOSE 179*  BUN 31*  CREATININE 7.49*  CALCIUM 10.5*  GFRNONAA 6*  ANIONGAP 19*    Recent Labs  Lab 02/08/22 0956  PROT 7.0  ALBUMIN 4.4  AST 17  ALT 25  ALKPHOS 81  BILITOT 0.4   Lipids No results for input(s): "CHOL", "TRIG", "HDL", "LABVLDL", "LDLCALC", "CHOLHDL" in the last 168 hours.  Hematology Recent Labs  Lab 02/08/22 0956  WBC 9.9  RBC 3.18*  HGB 11.3*  HCT 33.4*  MCV 105.0*  MCH 35.5*  MCHC 33.8  RDW 14.9  PLT 166   Thyroid No results for input(s): "TSH", "FREET4" in the last 168 hours.  BNPNo results for input(s): "BNP", "PROBNP" in the last 168 hours.  DDimer No results for input(s): "DDIMER" in the last 168 hours.   Radiology/Studies:  DG Chest Port 1 View  Result Date: 02/08/2022 CLINICAL DATA:  [age] male at age [age]presents for evaluation of chest pain that began last night. EXAM: PORTABLE CHEST 1 VIEW COMPARISON:  January 21, 2022. FINDINGS: Multi lead pacer defibrillator remains in place, power pack over LEFT chest. EKG leads project over the chest. Cardiomediastinal contours again with cardiac enlargement not changed from previous imaging. Central pulmonary vascular engorgement without frank edema. No consolidation. No visible pneumothorax. On limited assessment no acute skeletal findings. IMPRESSION: No active disease. Electronically Signed   By: GZetta BillsM.D.   On: 02/08/2022 10:43     Assessment and Plan:   Non-STEMI: Patient had pain with significant troponin elevation.  He has been started on IV heparin with improvement in the symptoms.  He has been given his dual antiplatelet therapy as well.  With his elevated troponin, history of coronary artery disease,  he would benefit from left heart catheterization to ensure that there are no issues with his current stent and to ensure that his 70% RCA lesion has not progressed.  We Jonothan Heberle plan for left heart catheterization tomorrow.  We Johann Gascoigne order sublingual nitroglycerin as needed.  If he develops significant continued chest pain not relieved by nitroglycerin or if he is having to take multiple tablets, nitroglycerin drip would be reasonable. Chronic systolic heart failure due to ischemic cardiomyopathy: Volume overload on chest x-ray.  Volume to be managed by nephrology with dialysis. End-stage renal disease: Dialysis Monday Wednesday Friday.  For questions or updates, please contact [place] Please consult [email or web address] for contact info under    Signed, Will Meredith Leeds, MD  02/08/2022 1:38 PM

## 2022-02-08 NOTE — Progress Notes (Signed)
ANTICOAGULATION CONSULT NOTE - Initial Consult  Pharmacy Consult for Heparin Indication: chest pain/ACS  Allergies  Allergen Reactions   Bee Venom Anaphylaxis   Lyrica [Pregabalin] Other (See Comments)    hallucinations   Prednisone Other (See Comments)    hallucinations   Zocor [Simvastatin] Nausea Only and Other (See Comments)    Headache with brand name only.  Can take the generic.    Patient Measurements: Height: 5\' 7"  (170.2 cm) Weight: 96.3 kg (212 lb 4.9 oz) IBW/kg (Calculated) : 66.1 Heparin Dosing Weight: 86.7 kg  Vital Signs: Temp: 98.1 F (36.7 C) (07/30 1017) Temp Source: Oral (07/30 1017) BP: 125/66 (07/30 1130) Pulse Rate: 79 (07/30 1130)  Labs: Recent Labs    02/08/22 0956  HGB 11.3*  HCT 33.4*  PLT 166  CREATININE 7.49*  TROPONINIHS 464*    Estimated Creatinine Clearance: 7.5 mL/min (A) (by C-G formula based on SCr of 7.49 mg/dL (H)).   Medical History: Past Medical History:  Diagnosis Date   AICD (automatic cardioverter/defibrillator) present    Anemia    Anxiety    Arthritis    Atrioventricular block, complete ([place])    a. 2010 s/p pacemaker.   Bell's palsy    left side. after shingles episode   Bilateral renal cysts 07/23/2017   Simple and hemorrhagic noted on CT ab/pelvis    BPH associated with nocturia    Chronic systolic CHF (congestive heart failure) ([place])    EF normalized by Echo 2019   COPD (chronic obstructive pulmonary disease) ([place])    Severe   Coronary artery disease    a. s/p MI in 1994/1995 while in [place] s/p questionable PCI. 03/2015: progression of disease, for staged PCI.   Depression    Diabetic peripheral neuropathy (HCC)    Diverticulitis    Full dentures    Gallstones    GERD (gastroesophageal reflux disease)    Gout    Hard of hearing    B/L   History of chronic pancreatitis 07/23/2017   noted on CT abd/pelvis   History of shingles    Hypercholesterolemia    Hypertension    Ischemic cardiomyopathy     MI (myocardial infarction) ([place]) 1994; 1995   Neuropathy    IN LOWER EXTREMITIES   Nosebleed 10/06/2017   for 2 months most recent 10/06/2017   Obesity    Pacemaker    medtronic>>> MDT ICD 09/23/15   Ruptured appendicitis    Sleep apnea    "sleeps w/humidifyer when he panics and gets short of breath" (04/08/2015)   TIA (transient ischemic attack) X 3   Trigger middle finger of left hand    Type II diabetes mellitus (HCC)    Wears glasses    Wears hearing aid     Medications:  (Not in a hospital admission)  Scheduled:   nitroGLYCERIN       Infusions:   heparin     PRN: heparin, nitroGLYCERIN, nitroGLYCERIN  Assessment: 89 yom with a history of HTN, hypercholesterolemia, complete heart block w/ pacemaker, DM, ESRD MWF HD, HF, and recent NSTEMI. Patient is presenting with multiple day chest pain. Heparin per pharmacy consult placed for chest pain/ACS.  Patient is not on anticoagulation prior to arrival.  Hgb 11.3; plt 166  Goal of Therapy:  Heparin level 0.3-0.7 units/ml Monitor platelets by anticoagulation protocol: Yes   Plan:  No initial heparin bolus Start heparin infusion at 1200 units/hr Check anti-Xa level in 8 hours and daily while on heparin Continue to  monitor H&H and platelets  Lorelei Pont, PharmD, BCPS 02/08/2022 11:40 AM ED Clinical Pharmacist -  [phone]

## 2022-02-08 NOTE — ED Provider Notes (Signed)
Pt transferred from [place] to see cardiology.  Pt has been having intermittent cp for a few days.  He had been taking nitro and it would go away, but it kept coming back.  He woke up at 0400 today with horrible pain.  He took some nitro and neurontin and went to the ED at [facility].  His trop was 464 today and it was 73 2 weeks ago.  He is now cp free after additional nitro.  He did receive heparin, plavix, and asa there.  Dr. Sherry Ruffing spoke with Dr. Curt Bears who recommended transfer here.  Pt continues to be cp free.  Pt d/w Dr. Curt Bears who did see pt.  He requests medicine admit.  Cards will consult.  Pt d/w Dr. Lorin Mercy ([facility]) for admission.   Isla Pence, MD 02/08/22 1359

## 2022-02-08 NOTE — ED Triage Notes (Addendum)
Patient arrives with complaints of worsening chest pain for several days. Patient has tried to manage the pain at home with nitroglycerin and gabapentin with no relief.  Patient reports chest pain (epigastric radiating up his left arm).   Reports 2 MI this year, angina attacks, and patient has a pacemaker. Patient is on dialysis. Right Arm Fistula (MWF). Last treatment on Friday

## 2022-02-08 NOTE — Consult Note (Signed)
Renal Service Consult Note [facility]  Rajon Bisig 02/08/2022 [name], MD Requesting Physician: Dr. Lorin Mercy  Reason for Consult: ESRD pt w/ chest pain/ nstemi HPI: The patient is a 88 y.o. year-old w/ hx of chronic syst CHF sp AICD, CAD sp stent, OSA, esrd on HD, DM, COPD, BPH presented w/ chest pain for the last few days. Taking sl ntg at home, getting worse, came to hospital. Had usual HD on Friday. In ED pt took NTG and gabapentin w/o improvement. Pt had coronary stent placed May 3rd and then on 6/14 had in-stent thrombosis requiring PCI and PTA. Trop is up here, cardiology consulted and started IV heparin and plan is for repeat heart cath tomorrow. We are asked to see for ESRD.   Pt seen in room, CP better, eating dinner. No SoB, leg swelling, "they got 3-4 kg of fluid off on Friday". No recent AVF issues (RUA AVF).   ROS - denies CP, no joint pain, no HA, no blurry vision, no rash, no diarrhea, no nausea/ vomiting, no dysuria, no difficulty voiding   Past Medical History  Past Medical History:  Diagnosis Date   AICD (automatic cardioverter/defibrillator) present    [employer] pacer   Anemia    Anxiety and depression    Arthritis    Bell's palsy    left side. after shingles episode   BPH associated with nocturia    Chronic systolic CHF (congestive heart failure) ([place])    EF normalized by Echo 2019   COPD (chronic obstructive pulmonary disease) ([place])    Severe   Coronary artery disease    a. s/p MI in 1994/1995 while in [place] s/p questionable PCI. 03/2015: progression of disease, for staged PCI.   Diabetic peripheral neuropathy (HCC)    GERD (gastroesophageal reflux disease)    Gout    Hard of hearing    B/L   History of chronic pancreatitis 07/23/2017   noted on CT abd/pelvis   History of shingles    Hypercholesterolemia    Hypertension    Obesity    Sleep apnea    "sleeps w/humidifyer when he panics and gets short of breath"  (04/08/2015)   TIA (transient ischemic attack) X 3   Trigger middle finger of left hand    Type II diabetes mellitus ([place])    Wears glasses    Wears hearing aid    Past Surgical History  Past Surgical History:  Procedure Laterality Date   APPENDECTOMY     AV FISTULA PLACEMENT Right 02/07/2019   Procedure: ARTERIOVENOUS (AV) FISTULA CREATION RIGHT UPPER ARM;  Surgeon: Waynetta Sandy, MD;  Location: [place];  Service: Vascular;  Laterality: Right;   BIV ICD GENERATOR CHANGEOUT N/A 10/09/2021   Procedure: BIV ICD GENERATOR CHANGEOUT;  Surgeon: Deboraha Sprang, MD;  Location: [place] [facility];  Service: Cardiovascular;  Laterality: N/A;   CARDIAC CATHETERIZATION N/A 03/29/2015   Procedure: Right/Left Heart Cath and Coronary Angiography;  Surgeon: Conlin M [name], MD;  Location: [place] [facility];  Service: Cardiovascular;  Laterality: N/A;   [place]   "after my MI; put me on heart RX after cath"   CARDIAC CATHETERIZATION N/A 04/09/2015   Procedure: Coronary Stent Intervention;  Surgeon: Gerardo M [name], MD;  Location: [place] [facility];  Service: Cardiovascular;  Laterality: N/A;   CATARACT EXTRACTION W/ INTRAOCULAR LENS  IMPLANT, BILATERAL     CHOLECYSTECTOMY N/A 10/13/2017   Procedure: LAPAROSCOPIC CHOLECYSTECTOMY WITH LYSIS  OF ADHESIONS;  Surgeon: Ileana Roup, MD;  Location: [facility];  Service: General;  Laterality: N/A;   COLONOSCOPY     CORONARY ANGIOGRAPHY N/A 11/12/2021   Procedure: CORONARY ANGIOGRAPHY;  Surgeon: Wellington Hampshire, MD;  Location: [place] [facility];  Service: Cardiovascular;  Laterality: N/A;   CORONARY ANGIOGRAPHY N/A 12/24/2021   Procedure: CORONARY ANGIOGRAPHY;  Surgeon: Troy Sine, MD;  Location: [place] [facility];  Service: Cardiovascular;  Laterality: N/A;   CORONARY BALLOON ANGIOPLASTY N/A 12/24/2021   Procedure: CORONARY BALLOON ANGIOPLASTY;  Surgeon: Troy Sine, MD;  Location: [place] [facility];  Service:  Cardiovascular;  Laterality: N/A;   CORONARY STENT INTERVENTION N/A 11/12/2021   Procedure: CORONARY STENT INTERVENTION;  Surgeon: Wellington Hampshire, MD;  Location: [place] [facility];  Service: Cardiovascular;  Laterality: N/A;  LAD   DENTAL SURGERY     EP IMPLANTABLE DEVICE N/A 09/23/2015   MDT CRT-D, Dr. Caryl Comes   HIATAL HERNIA REPAIR  1977   ILEOCECETOMY N/A 03/27/2017   Procedure: ILEOCECECTOMY;  Surgeon: Ileana Roup, MD;  Location: [place];  Service: General;  Laterality: N/A;   INSERT / REPLACE / REMOVE PACEMAKER  07/2008   Complete heart block status post DDD with good function   INTRAVASCULAR IMAGING/OCT N/A 12/24/2021   Procedure: INTRAVASCULAR IMAGING/OCT;  Surgeon: Troy Sine, MD;  Location: [place] [facility];  Service: Cardiovascular;  Laterality: N/A;   IR FLUORO GUIDE CV LINE RIGHT  04/03/2019   IR US GUIDE VASC ACCESS RIGHT  04/03/2019   LAPAROTOMY N/A 03/27/2017   Procedure: EXPLORATORY LAPAROTOMY;  Surgeon: Ileana Roup, MD;  Location: [facility];  Service: General;  Laterality: N/A;   TONSILLECTOMY     UPPER GI ENDOSCOPY     Family History  Family History  Problem Relation Age of Onset   Stroke Mother    Leukemia Father    Stroke Sister    Heart attack Brother    Social History  reports that he quit smoking about 14 years ago. His smoking use included cigarettes. He has a 81.00 pack-year smoking history. He has never been exposed to tobacco smoke. He has never used smokeless tobacco. He reports current alcohol use. He reports that he does not use drugs. Allergies  Allergies  Allergen Reactions   Bee Venom Anaphylaxis   Lyrica [Pregabalin] Other (See Comments)    hallucinations   Prednisone Other (See Comments)    hallucinations   Zocor [Simvastatin] Nausea Only and Other (See Comments)    Headache with brand name only.  Can take the generic.   Home medications Prior to Admission medications   Medication Sig Start Date End Date Taking? Authorizing  Provider  aspirin EC 81 MG tablet Take 81 mg by mouth daily.   Yes [provider]  atorvastatin (LIPITOR) 80 MG tablet Take 1 tablet (80 mg total) by mouth daily. Patient taking differently: Take 80 mg by mouth at bedtime. 11/14/21  Yes Margie Billet, PA-C  calcitRIOL (ROCALTROL) 0.25 MCG capsule Take 7 capsules (1.75 mcg total) by mouth every Monday, Wednesday, and Friday with hemodialysis. 01/26/22  Yes [name], Elmin M, MD  carbamide peroxide (DEBROX) 6.5 % OTIC solution Place 5 drops into both ears 2 (two) times daily as needed (earwax). 11/13/21  Yes Lelon Perla, MD  Carboxymethylcellul-Glycerin (LUBRICATING EYE DROPS OP) Place 1 drop into both eyes 3 (three) times daily as needed (dry eyes).   Yes [provider]  clopidogrel (PLAVIX) 75 MG tablet Take 1 tablet (75 mg total) by mouth daily with breakfast. 11/14/21  Yes Margie Billet, PA-C  gabapentin (NEURONTIN) 300 MG capsule Take 1 capsule (300 mg total) by mouth 2 (two) times daily. 01/05/22  Yes Lelon Perla, MD  midodrine (PROAMATINE) 10 MG tablet Take 1 tablet (10 mg) three times a week. Add 5 mg a night after dialysis as needed for low blood pressure. Patient taking differently: Take 5-10 mg by mouth See admin instructions. Take 1 tablet (10 mg) three times a week. Add 5 mg a night after dialysis as needed for low blood pressure. 12/03/21  Yes Sande Rives E, PA-C  multivitamin (RENA-VIT) TABS tablet Take 1 tablet by mouth daily. 08/22/21  Yes [name], MD  nitroGLYCERIN (NITROSTAT) 0.4 MG SL tablet Place 1 tablet (0.4 mg total) under the tongue every 5 (five) minutes x 3 doses as needed for chest pain. 11/13/21  Yes Margie Billet, PA-C  oxymetazoline (AFRIN) 0.05 % nasal spray Place 1 spray into both nostrils 2 (two) times daily as needed for congestion. 12/29/21  Yes [name], MD  pantoprazole (PROTONIX) 40 MG tablet Take 1 tablet by mouth daily. 11/14/21  Yes Margie Billet,  PA-C  saccharomyces boulardii (FLORASTOR) 250 MG capsule Take 1 capsule (250 mg total) by mouth 2 (two) times daily. 04/08/19  Yes Regalado, Belkys A, MD  sevelamer carbonate (RENVELA) 800 MG tablet Take 2 tablets (1,600 mg total) by mouth 3 (three) times daily with meals. Patient taking differently: Take 1,600 mg by mouth See admin instructions. Take 2 tablets by mouth three times daily with meals and 1 tablet with snacks 11/13/21  Yes Crenshaw, Denice Bors, MD  vitamin B-12 (CYANOCOBALAMIN) 1000 MCG tablet Take 1,000 mcg by mouth daily.   Yes [provider]  glucose blood (FREESTYLE TEST STRIPS) test strip Use to check blood sugar daily 05/01/19   [name], MD  glucose monitoring kit (FREESTYLE) monitoring kit USE TO MONITOR BLOOD GLUCOSE AS DIRECTED 05/01/19   [name], MD  Methoxy PEG-Epoetin Beta (MIRCERA IJ) as directed. 02/26/21 07/15/22  [provider]     Vitals:   02/08/22 1600 02/08/22 1630 02/08/22 1721 02/08/22 1757  BP: 133/73 (!) 150/76 (!) 146/69 135/75  Pulse: 79 80 80 81  Resp: _0 Temp:   97.7 F (36.5 C) (!) 97.2 F (36.2 C)  TempSrc:    Oral  SpO2: 95% 95% 96% 97%  Weight:    97.1 kg  Height:       Exam Gen alert, no distress No rash, cyanosis or gangrene Sclera anicteric, throat clear  No jvd or bruits Chest clear bilat to bases, no rales/ wheezing RRR no MRG Abd soft ntnd no mass or ascites +bs GU normal MS no joint effusions or deformity Ext no LE or UE edema, no wounds or ulcers Neuro is alert, Ox 3 , nf    RUA AVF+bruit    Home meds include - atorvastatin, aspirin, calcitriol 1.75 mcg mwf w/ hd, clopidogrel, gabapentin 300 bid, midodrine 62m pre hd mwf +  545mpost hd prn, renavit, sl ntg, pantoprazole, sevelamer carbonate 2 ac tid, saccaromyces boulardii, vits/ supps/ prns    OP HD: MWF NW  4h  400/500  96.5kg   2/2 bath P2   Heparin 2500 + 100057mun  RUA AVF - last HD 7/28, post wt 96.0 - last Hb 10.3 -  mircera  50 q 2wks, last 7/26, due 8/09 - calcitriol 1.75 ug tiw po w hd   Assessment/ Plan: CP/ NSTEMI - better on IV heparin. Hx recent PCI/ stenting in may and again in June. Per cardiology, plan is for [facility] tomorrow.  ESRD - on HD MWF. Has not missed HD. HD tomorrow, schedule around [facility]. Hypotension - takes midodrine 10 mg pre HD on mwf.  Hyperkalemia - mild, K+ 5.2, lokelma ordered bid, agree. Renal diet.  Vol - no vol excess on exam, CXR no edema. Is 05.kg up here tonight.  Plan small  UF goal w/ next HD 1-2 L Anemia esrd - Hb 11.3, just got OP esa, no esa needs for now.  MBD ckd - CCa is a bit high, will add phos. Cont renvela as binder and po vdra w/ hd tiw.  DM2  Chronic syst CHF/ sp AICD - last EF 40-45%      Kelly Splinter  MD 02/08/2022, 6:30 PM Recent Labs  Lab 02/08/22 0956  HGB 11.3*  ALBUMIN 4.4  CALCIUM 10.5*  CREATININE 7.49*  K 5.2*

## 2022-02-08 NOTE — ED Notes (Signed)
CRITICAL VALUE STICKER  CRITICAL VALUE:Troponin 856  DAPTCKFW (on-site recipient of call):Shawnie Pons, RN  DATE & TIME NOTIFIED:   MESSENGER (representative from lab):  MD NOTIFIED: Dr. Sherry Ruffing  TIME OF NOTIFICATION:1106  RESPONSE:

## 2022-02-09 ENCOUNTER — Encounter (HOSPITAL_COMMUNITY)
Admission: EM | Disposition: A | Discharge status: Home / Self Care | Payer: Self-pay | Source: Home / Self Care | Attending: Obstetrics and Gynecology

## 2022-02-09 DIAGNOSIS — I2510 Atherosclerotic heart disease of native coronary artery without angina pectoris: Secondary | ICD-10-CM | POA: Diagnosis not present

## 2022-02-09 DIAGNOSIS — I214 Non-ST elevation (NSTEMI) myocardial infarction: Secondary | ICD-10-CM | POA: Diagnosis not present

## 2022-02-09 HISTORY — PX: CORONARY ULTRASOUND/IVUS: CATH118244

## 2022-02-09 HISTORY — PX: RIGHT HEART CATH AND CORONARY ANGIOGRAPHY: CATH118264

## 2022-02-09 HISTORY — PX: INTRAVASCULAR ULTRASOUND/IVUS: CATH118244

## 2022-02-09 HISTORY — PX: CORONARY BALLOON ANGIOPLASTY: CATH118233

## 2022-02-09 HISTORY — PX: PERIPHERAL INTRAVASCULAR LITHOTRIPSY: CATH118324

## 2022-02-09 HISTORY — PX: INTRAVASCULAR LITHOTRIPSY: CATH118324

## 2022-02-09 LAB — BASIC METABOLIC PANEL
Anion gap: 17 — ABNORMAL HIGH (ref 5–15)
BUN: 38 mg/dL — ABNORMAL HIGH (ref 8–23)
CO2: 29 mmol/L (ref 22–32)
Calcium: 9.3 mg/dL (ref 8.9–10.3)
Chloride: 94 mmol/L — ABNORMAL LOW (ref 98–111)
Creatinine, Ser: 8.91 mg/dL — ABNORMAL HIGH (ref 0.61–1.24)
GFR, Estimated: 5 mL/min — ABNORMAL LOW (ref >60)
Glucose, Bld: 170 mg/dL — ABNORMAL HIGH (ref 70–99)
Potassium: 5.3 mmol/L — ABNORMAL HIGH (ref 3.5–5.1)
Sodium: 140 mmol/L (ref 135–145)

## 2022-02-09 LAB — GLUCOSE, CAPILLARY
Glucose-Capillary: 140 mg/dL — ABNORMAL HIGH (ref 70–99)
Glucose-Capillary: 123 mg/dL — ABNORMAL HIGH (ref 70–99)
Glucose-Capillary: 95 mg/dL (ref 70–99)

## 2022-02-09 LAB — POCT ACTIVATED CLOTTING TIME
Activated Clotting Time: 245 seconds
Activated Clotting Time: 275 seconds
Activated Clotting Time: 275 seconds
Activated Clotting Time: 293 seconds
Activated Clotting Time: 197 seconds
Activated Clotting Time: 179 seconds

## 2022-02-09 LAB — POCT I-STAT 7, (LYTES, BLD GAS, ICA,H+H)
Acid-Base Excess: 3 mmol/L — ABNORMAL HIGH (ref 0.0–2.0)
Bicarbonate: 28.2 mmol/L — ABNORMAL HIGH (ref 20.0–28.0)
Calcium, Ion: 1.1 mmol/L — ABNORMAL LOW (ref 1.15–1.40)
HCT: 31 % — ABNORMAL LOW (ref 39.0–52.0)
Hemoglobin: 10.5 g/dL — ABNORMAL LOW (ref 13.0–17.0)
O2 Saturation: 92 %
Potassium: 5.5 mmol/L — ABNORMAL HIGH (ref 3.5–5.1)
Sodium: 137 mmol/L (ref 135–145)
TCO2: 30 mmol/L (ref 22–32)
pCO2 arterial: 46.7 mmHg (ref 32–48)
pH, Arterial: 7.389 (ref 7.35–7.45)
pO2, Arterial: 64 mmHg — ABNORMAL LOW (ref 83–108)

## 2022-02-09 LAB — POCT I-STAT EG7
Acid-Base Excess: 4 mmol/L — ABNORMAL HIGH (ref 0.0–2.0)
Bicarbonate: 29.7 mmol/L — ABNORMAL HIGH (ref 20.0–28.0)
Calcium, Ion: 1.12 mmol/L — ABNORMAL LOW (ref 1.15–1.40)
HCT: 31 % — ABNORMAL LOW (ref 39.0–52.0)
Hemoglobin: 10.5 g/dL — ABNORMAL LOW (ref 13.0–17.0)
O2 Saturation: 72 %
Potassium: 5.6 mmol/L — ABNORMAL HIGH (ref 3.5–5.1)
Sodium: 136 mmol/L (ref 135–145)
TCO2: 31 mmol/L (ref 22–32)
pCO2, Ven: 47.2 mmHg (ref 44–60)
pH, Ven: 7.407 (ref 7.25–7.43)
pO2, Ven: 38 mmHg (ref 32–45)

## 2022-02-09 LAB — CBC
HCT: 29.7 % — ABNORMAL LOW (ref 39.0–52.0)
Hemoglobin: 9.7 g/dL — ABNORMAL LOW (ref 13.0–17.0)
MCH: 34.9 pg — ABNORMAL HIGH (ref 26.0–34.0)
MCHC: 32.7 g/dL (ref 30.0–36.0)
MCV: 106.8 fL — ABNORMAL HIGH (ref 80.0–100.0)
Platelets: 150 10*3/uL (ref 150–400)
RBC: 2.78 MIL/uL — ABNORMAL LOW (ref 4.22–5.81)
RDW: 15 % (ref 11.5–15.5)
WBC: 7.6 10*3/uL (ref 4.0–10.5)
nRBC: 0 % (ref 0.0–0.2)

## 2022-02-09 LAB — HEPARIN LEVEL (UNFRACTIONATED): Heparin Unfractionated: 0.27 IU/mL — ABNORMAL LOW (ref 0.30–0.70)

## 2022-02-09 LAB — HEPATITIS C ANTIBODY: HCV Ab: NONREACTIVE

## 2022-02-09 LAB — MRSA NEXT GEN BY PCR, NASAL: MRSA by PCR Next Gen: NOT DETECTED

## 2022-02-09 LAB — HEPATITIS B SURFACE ANTIBODY,QUALITATIVE: Hep B S Ab: REACTIVE — AB

## 2022-02-09 SURGERY — INTRAVASCULAR ULTRASOUND/IVUS
Anesthesia: LOCAL

## 2022-02-09 MED ORDER — FENTANYL CITRATE (PF) 100 MCG/2ML IJ SOLN
INTRAMUSCULAR | Status: AC
  Filled 2022-02-09: qty 2

## 2022-02-09 MED ORDER — HEPARIN (PORCINE) IN NACL 1000-0.9 UT/500ML-% IV SOLN
INTRAVENOUS | Status: AC
  Filled 2022-02-09: qty 500

## 2022-02-09 MED ORDER — LIDOCAINE HCL (PF) 1 % IJ SOLN: 5.0000 mL | INTRAMUSCULAR | Status: DC | PRN

## 2022-02-09 MED ORDER — SODIUM CHLORIDE 0.9% FLUSH: 3.0000 mL | INTRAVENOUS | Status: DC | PRN

## 2022-02-09 MED ORDER — LIDOCAINE-PRILOCAINE 2.5-2.5 % EX CREA: TOPICAL_CREAM | CUTANEOUS | Status: DC | PRN

## 2022-02-09 MED ORDER — IOHEXOL 350 MG/ML SOLN
INTRAVENOUS | Status: DC | PRN
  Administered 2022-02-09: 190 mL

## 2022-02-09 MED ORDER — ALBUTEROL SULFATE HFA 108 (90 BASE) MCG/ACT IN AERS
1.0000 | INHALATION_SPRAY | RESPIRATORY_TRACT | Status: DC | PRN
Start: 2022-02-09 — End: 2022-02-09

## 2022-02-09 MED ORDER — LIDOCAINE HCL (PF) 1 % IJ SOLN
INTRAMUSCULAR | Status: AC
  Filled 2022-02-09: qty 30

## 2022-02-09 MED ORDER — FENTANYL CITRATE (PF) 100 MCG/2ML IJ SOLN
INTRAMUSCULAR | Status: DC | PRN
  Administered 2022-02-09 (×4): 12.5 ug via INTRAVENOUS

## 2022-02-09 MED ORDER — HEPARIN (PORCINE) IN NACL 1000-0.9 UT/500ML-% IV SOLN
INTRAVENOUS | Status: AC
  Filled 2022-02-09: qty 1000

## 2022-02-09 MED ORDER — SODIUM CHLORIDE 0.9% FLUSH
3.0000 mL | Freq: Two times a day (BID) | INTRAVENOUS | Status: DC
Start: 2022-02-09 — End: 2022-02-09

## 2022-02-09 MED ORDER — MIDODRINE HCL 5 MG PO TABS
10.0000 mg | ORAL_TABLET | ORAL | Status: DC
  Administered 2022-02-09 (×2): 10 mg via ORAL
  Filled 2022-02-09 (×3): qty 2

## 2022-02-09 MED ORDER — ALBUTEROL SULFATE (2.5 MG/3ML) 0.083% IN NEBU
2.5000 mg | INHALATION_SOLUTION | RESPIRATORY_TRACT | Status: DC | PRN
Start: 2022-02-09 — End: 2022-02-10

## 2022-02-09 MED ORDER — NITROGLYCERIN 1 MG/10 ML FOR IR/CATH LAB
INTRA_ARTERIAL | Status: AC
  Filled 2022-02-09: qty 10

## 2022-02-09 MED ORDER — SODIUM CHLORIDE 0.9% FLUSH: 3.0000 mL | Freq: Two times a day (BID) | INTRAVENOUS | Status: DC

## 2022-02-09 MED ORDER — NITROGLYCERIN 1 MG/10 ML FOR IR/CATH LAB
INTRA_ARTERIAL | Status: DC | PRN
  Administered 2022-02-09 (×2): 100 ug via INTRACORONARY
  Administered 2022-02-09: 200 ug via INTRACORONARY

## 2022-02-09 MED ORDER — HEPARIN SODIUM (PORCINE) 1000 UNIT/ML IJ SOLN
INTRAMUSCULAR | Status: AC
  Filled 2022-02-09: qty 10

## 2022-02-09 MED ORDER — NITROGLYCERIN 0.4 MG SL SUBL
SUBLINGUAL_TABLET | SUBLINGUAL | Status: AC
  Filled 2022-02-09: qty 1

## 2022-02-09 MED ORDER — PENTAFLUOROPROP-TETRAFLUOROETH EX AERO: INHALATION_SPRAY | CUTANEOUS | Status: DC | PRN

## 2022-02-09 MED ORDER — CLOPIDOGREL BISULFATE 300 MG PO TABS
ORAL_TABLET | ORAL | Status: AC
  Filled 2022-02-09: qty 1

## 2022-02-09 MED ORDER — HEPARIN SODIUM (PORCINE) 5000 UNIT/ML IJ SOLN
5000.0000 [IU] | Freq: Three times a day (TID) | INTRAMUSCULAR | Status: DC
  Filled 2022-02-09: qty 1

## 2022-02-09 MED ORDER — SODIUM CHLORIDE 0.9 % IV SOLN: 250.0000 mL | INTRAVENOUS | Status: DC | PRN

## 2022-02-09 MED ORDER — ASPIRIN 81 MG PO CHEW
81.0000 mg | CHEWABLE_TABLET | Freq: Every day | ORAL | Status: DC
  Administered 2022-02-10: 81 mg via ORAL
  Filled 2022-02-09: qty 1

## 2022-02-09 MED ORDER — HYDRALAZINE HCL 20 MG/ML IJ SOLN: 10.0000 mg | INTRAMUSCULAR | Status: DC | PRN

## 2022-02-09 MED ORDER — LIDOCAINE HCL (PF) 1 % IJ SOLN
INTRAMUSCULAR | Status: DC | PRN
  Administered 2022-02-09: 15 mL

## 2022-02-09 MED ORDER — MIDAZOLAM HCL 2 MG/2ML IJ SOLN
INTRAMUSCULAR | Status: AC
  Filled 2022-02-09: qty 2

## 2022-02-09 MED ORDER — MIDAZOLAM HCL 2 MG/2ML IJ SOLN
INTRAMUSCULAR | Status: DC | PRN
  Administered 2022-02-09 (×2): .5 mg via INTRAVENOUS

## 2022-02-09 MED ORDER — CLOPIDOGREL BISULFATE 300 MG PO TABS
ORAL_TABLET | ORAL | Status: DC | PRN
  Administered 2022-02-09: 300 mg via ORAL

## 2022-02-09 MED ORDER — HEPARIN (PORCINE) IN NACL 1000-0.9 UT/500ML-% IV SOLN
INTRAVENOUS | Status: DC | PRN
  Administered 2022-02-09 (×2): 500 mL

## 2022-02-09 MED ORDER — NITROGLYCERIN 0.4 MG SL SUBL
SUBLINGUAL_TABLET | SUBLINGUAL | Status: DC | PRN
  Administered 2022-02-09 (×2): .4 mg via SUBLINGUAL

## 2022-02-09 MED ORDER — LABETALOL HCL 5 MG/ML IV SOLN: 10.0000 mg | INTRAVENOUS | Status: DC | PRN

## 2022-02-09 MED ORDER — HEPARIN SODIUM (PORCINE) 1000 UNIT/ML IJ SOLN
INTRAMUSCULAR | Status: DC | PRN
  Administered 2022-02-09: 8000 [IU] via INTRAVENOUS
  Administered 2022-02-09: 2000 [IU] via INTRAVENOUS
  Administered 2022-02-09: 4000 [IU] via INTRAVENOUS
  Administered 2022-02-09: 3000 [IU] via INTRAVENOUS

## 2022-02-09 SURGICAL SUPPLY — 35 items
BALL SAPPHIRE NC24 3.5X12 (BALLOONS) ×3
BALL SAPPHIRE NC24 4.0X12 (BALLOONS) ×3
BALLOON SAPPHIRE NC24 3.5X12 (BALLOONS) IMPLANT
BALLOON SAPPHIRE NC24 4.0X12 (BALLOONS) IMPLANT
CATH INFINITI 5FR MULTPACK ANG (CATHETERS) ×1 IMPLANT
CATH LAUNCHER 6FR EBU 3.75 (CATHETERS) ×1 IMPLANT
CATH OPTICROSS HD (CATHETERS) ×1 IMPLANT
CATH SHOCKWAVE 4.0X12 (CATHETERS) IMPLANT
CATH SWAN GANZ 7F STRAIGHT (CATHETERS) ×1 IMPLANT
CATH VISTA GUIDE 6FR XBLAD3.5 (CATHETERS) ×1 IMPLANT
CATHETER SHOCKWAVE 4.0X12 (CATHETERS) ×3
COVER SWIFTLINK CONNECTOR (BAG) ×1 IMPLANT
ELECT DEFIB PAD ADLT CADENCE (PAD) ×1 IMPLANT
GUIDEWIRE INQWIRE 1.5J.035X260 (WIRE) IMPLANT
INQWIRE 1.5J .035X260CM (WIRE) ×3
KIT ENCORE 26 ADVANTAGE (KITS) ×1 IMPLANT
KIT HEART LEFT (KITS) ×3 IMPLANT
MAT PREVALON FULL STRYKER (MISCELLANEOUS) ×1 IMPLANT
PACK CARDIAC CATHETERIZATION (CUSTOM PROCEDURE TRAY) ×3 IMPLANT
PINNACLE LONG 5F 25CM (SHEATH) ×6
PINNACLE LONG 6F 25CM (SHEATH) ×3
SHEATH INTRO PINNACLE 6F 25CM (SHEATH) IMPLANT
SHEATH INTROD PINNACLE 5F 25CM (SHEATH) IMPLANT
SHEATH PINNACLE 5F 10CM (SHEATH) ×1 IMPLANT
SHEATH PINNACLE 6F 10CM (SHEATH) ×1 IMPLANT
SHEATH PINNACLE 7F 10CM (SHEATH) ×1 IMPLANT
SHEATH PROBE COVER 6X72 (BAG) ×1 IMPLANT
SLED PULL BACK IVUS (MISCELLANEOUS) ×1 IMPLANT
TRANSDUCER W/STOPCOCK (MISCELLANEOUS) ×3 IMPLANT
TUBING CIL FLEX 10 FLL-RA (TUBING) ×3 IMPLANT
WIRE ASAHI PROWATER 180CM (WIRE) ×1 IMPLANT
WIRE EMERALD 3MM-J .025X260CM (WIRE) ×1 IMPLANT
WIRE EMERALD 3MM-J .035X150CM (WIRE) ×1 IMPLANT
WIRE MICRO SET SILHO 5FR 7 (SHEATH) ×1 IMPLANT
WIRE RUNTHROUGH .014X180CM (WIRE) ×1 IMPLANT

## 2022-02-09 NOTE — Progress Notes (Signed)
Hypotensive: inform Dr Myna Hidalgo: requested to give midodrine early

## 2022-02-09 NOTE — Progress Notes (Addendum)
[facility] Progress Note   Subjective:   Patient seen and examined at bedside.  Getting ready to go to cath lab.  Reports discomfort in upper left chest/shoulder this AM, now improved.  Denies SOB, n/v/d, abdominal pain and edema.   Objective Vitals:   02/08/22 2313 02/09/22 0500 02/09/22 0600 02/09/22 0710  BP: (!) 119/58  (S) (!) 93/51 94/74  Pulse:      Resp: 16     Temp: 97.7 F (36.5 C)  97.9 F (36.6 C)   TempSrc: Oral  Oral   SpO2: 100%   99%  Weight: 98.8 kg 98.5 kg    Height:       Physical Exam General:well appearing elderly male in NAD Heart:RRR, +2/7 systolic murmur Lungs:CTAB, nml WOB on RA Abdomen:soft, NTND Extremities:no LE edema Dialysis Access: RU AVF +b/t   Filed Weights   02/08/22 1757 02/08/22 2313 02/09/22 0500  Weight: 97.1 kg 98.8 kg 98.5 kg    Intake/Output Summary (Last 24 hours) at 02/09/2022 0843 Last data filed at 02/09/2022 0800 Gross per 24 hour  Intake 247.14 ml  Output --  Net 247.14 ml    Additional Objective Labs: Basic Metabolic Panel: Recent Labs  Lab 02/08/22 0956 02/08/22 2025 02/09/22 0410  NA 140  --  140  K 5.2*  --  5.3*  CL 93*  --  94*  CO2 28  --  29  GLUCOSE 179*  --  170*  BUN 31*  --  38*  CREATININE 7.49*  --  8.91*  CALCIUM 10.5*  --  9.3  PHOS  --  6.7*  --    Liver Function Tests: Recent Labs  Lab 02/08/22 0956  AST 17  ALT 25  ALKPHOS 81  BILITOT 0.4  PROT 7.0  ALBUMIN 4.4   Recent Labs  Lab 02/08/22 0956  LIPASE 50   CBC: Recent Labs  Lab 02/08/22 0956 02/09/22 0410  WBC 9.9 7.6  HGB 11.3* 9.7*  HCT 33.4* 29.7*  MCV 105.0* 106.8*  PLT 166 150    CBG: Recent Labs  Lab 02/08/22 1810 02/09/22 0807  GLUCAP 129* 140*    Studies/Results: DG Chest Port 1 View  Result Date: 02/08/2022 CLINICAL DATA:  A male at age 29 presents for evaluation of chest pain that began last night. EXAM: PORTABLE CHEST 1 VIEW COMPARISON:  January 21, 2022. FINDINGS: Multi lead pacer  defibrillator remains in place, power pack over LEFT chest. EKG leads project over the chest. Cardiomediastinal contours again with cardiac enlargement not changed from previous imaging. Central pulmonary vascular engorgement without frank edema. No consolidation. No visible pneumothorax. On limited assessment no acute skeletal findings. IMPRESSION: No active disease. Electronically Signed   By: Zetta Bills M.D.   On: 02/08/2022 10:43    Medications:  sodium chloride     sodium chloride     heparin 1,450 Units/hr (02/09/22 0800)    [MAR Hold] aspirin  81 mg Oral Daily   [MAR Hold] atorvastatin  80 mg Oral QHS   [MAR Hold] calcitRIOL  1.75 mcg Oral Q M,W,F-HD   [MAR Hold] Chlorhexidine Gluconate Cloth  6 each Topical Q0600   [MAR Hold] clopidogrel  75 mg Oral Q breakfast   [MAR Hold] gabapentin  300 mg Oral BID   [MAR Hold] insulin aspart  0-6 Units Subcutaneous TID WC   [MAR Hold] midodrine  10 mg Oral Q M,W,F   [MAR Hold] multivitamin  1 tablet Oral Daily   [MAR  Hold] pantoprazole  40 mg Oral Daily   [MAR Hold] sevelamer carbonate  1,600 mg Oral TID WC   [MAR Hold] sevelamer carbonate  800 mg Oral With snacks   [MAR Hold] sodium chloride flush  3 mL Intravenous Q12H   [MAR Hold] sodium chloride flush  3 mL Intravenous Q12H   [MAR Hold] sodium zirconium cyclosilicate  10 g Oral BID    Dialysis Orders: MWF NW  4h  400/500  96.5kg   2/2 bath P2   Heparin 2500 + 109midrun  RUA AVF - last HD 7/28, post wt 96.0 - last Hb 10.3 - mircera 50 q 2wks, last 7/26, due 8/09 - calcitriol 1.75 ug tiw po w hd    Assessment/ Plan: CP/ NSTEMI - better on IV heparin. Hx recent PCI/ stenting in May and again in June. Per cardiology, plan is for [facility] today ESRD - on HD MWF. HD today per regular schedule.   Hypotension - takes midodrine 10 mg pre HD on mwf, and 5mg  prn for hypotension at home. BP soft this AM and improved with midodrine.  Hyperkalemia - mild, K+ 5.3, 10mg  lokelma given overnight.   HD today. Renal diet.  Vol - no vol excess on exam, CXR no edema. Is 05.kg up here tonight.  Plan small UF goal today. Anemia esrd - Hb 9.7, ESA recently dosed.  MBD ckd - CCa and phos elevated.  Cont renvela as binder and po vdra w/ hd tiw.  DM2  Chronic syst CHF/ sp AICD - last EF 40-45%  Jen Mow, PA-C [facility] 02/09/2022,8:43 AM  LOS: 1 day   Nephrology attending: The patient was seen and examined.  Chart reviewed.  I agree with above. 88 year old male ESRD on HD, well-known to me from outpatient admitted with recurrent chest pain, and NSTEMI.  He underwent cardiac cath today demonstrated in-stent restenosis of a previously placed stent.  The patient underwent IV ultrasound-guided lithotripsy without placement of additional stent and high-pressure balloon dilatation.  Recommend to continue dual antiplatelet agent and statin.  Plan for dialysis today. Patient reports feeling much better.  Denies nausea, vomiting, chest pain. Katheran James, [facility].

## 2022-02-09 NOTE — Progress Notes (Addendum)
Progress Note  Patient Name: Rodney Farley Date of Encounter: 02/09/2022  [facility] Cardiologist: Alaa [name], MD   Subjective   Did have some mild chest discomfort this morning which resolved without intervention.   Inpatient Medications    Scheduled Meds:  atorvastatin  80 mg Oral QHS   calcitRIOL  1.75 mcg Oral Q M,W,F-HD   Chlorhexidine Gluconate Cloth  6 each Topical Q0600   clopidogrel  75 mg Oral Q breakfast   gabapentin  300 mg Oral BID   insulin aspart  0-6 Units Subcutaneous TID WC   midodrine  10 mg Oral Q M,W,F   multivitamin  1 tablet Oral Daily   pantoprazole  40 mg Oral Daily   sevelamer carbonate  1,600 mg Oral TID WC   sevelamer carbonate  800 mg Oral With snacks   sodium chloride flush  3 mL Intravenous Q12H   sodium chloride flush  3 mL Intravenous Q12H   sodium zirconium cyclosilicate  10 g Oral BID   Continuous Infusions:  sodium chloride     sodium chloride     heparin 1,450 Units/hr (02/09/22 0632)   PRN Meds: sodium chloride, acetaminophen **OR** acetaminophen, calcium carbonate (dosed in mg elemental calcium), camphor-menthol **AND** hydrOXYzine, carboxymethylcellul-glycerin, docusate sodium, feeding supplement (NEPRO CARB STEADY), hydrALAZINE, midodrine, nitroGLYCERIN, ondansetron **OR** ondansetron (ZOFRAN) IV, sodium chloride flush, sorbitol, zolpidem   Vital Signs    Vitals:   02/08/22 2313 02/09/22 0500 02/09/22 0600 02/09/22 0710  BP: (!) 119/58  (S) (!) 93/51 94/74  Pulse:      Resp: 16     Temp: 97.7 F (36.5 C)  97.9 F (36.6 C)   TempSrc: Oral  Oral   SpO2: 100%     Weight: 98.8 kg 98.5 kg    Height:        Intake/Output Summary (Last 24 hours) at 02/09/2022 0814 Last data filed at 02/08/2022 2000 Gross per 24 hour  Intake 95.92 ml  Output --  Net 95.92 ml      02/09/2022    5:00 AM 02/08/2022   11:13 PM 02/08/2022    5:57 PM  Last 3 Weights  Weight (lbs) 217 lb 2.5 oz 217 lb 13 oz 214 lb 1.6 oz  Weight  (kg) 98.5 kg 98.8 kg 97.115 kg      Telemetry    Sinus Rhythm, AVpaced - Personally Reviewed  ECG    No new tracing  Physical Exam   GEN: No acute distress.   Neck: No JVD Cardiac: RRR, no murmurs, rubs, or gallops.  Respiratory: Clear to auscultation bilaterally. GI: Soft, nontender, non-distended  MS: No edema; No deformity. Neuro:  Nonfocal  Psych: Normal affect   Labs    High Sensitivity Troponin:   Recent Labs  Lab 01/21/22 1200 01/21/22 1803 01/21/22 1957 02/08/22 0956 02/08/22 1247  TROPONINIHS 59* 65* 73* 464* 498*     Chemistry Recent Labs  Lab 02/08/22 0956 02/09/22 0410  NA 140 140  K 5.2* 5.3*  CL 93* 94*  CO2 28 29  GLUCOSE 179* 170*  BUN 31* 38*  CREATININE 7.49* 8.91*  CALCIUM 10.5* 9.3  PROT 7.0  --   ALBUMIN 4.4  --   AST 17  --   ALT 25  --   ALKPHOS 81  --   BILITOT 0.4  --   GFRNONAA 6* 5*  ANIONGAP 19* 17*    Lipids No results for input(s): "CHOL", "TRIG", "HDL", "LABVLDL", "LDLCALC", "CHOLHDL" in the  last 168 hours.  Hematology Recent Labs  Lab 02/08/22 0956 02/09/22 0410  WBC 9.9 7.6  RBC 3.18* 2.78*  HGB 11.3* 9.7*  HCT 33.4* 29.7*  MCV 105.0* 106.8*  MCH 35.5* 34.9*  MCHC 33.8 32.7  RDW 14.9 15.0  PLT 166 150   Thyroid No results for input(s): "TSH", "FREET4" in the last 168 hours.  BNPNo results for input(s): "BNP", "PROBNP" in the last 168 hours.  DDimer No results for input(s): "DDIMER" in the last 168 hours.   Radiology    DG Chest Port 1 View  Result Date: 02/08/2022 CLINICAL DATA:  A male at age 31 presents for evaluation of chest pain that began last night. EXAM: PORTABLE CHEST 1 VIEW COMPARISON:  January 21, 2022. FINDINGS: Multi lead pacer defibrillator remains in place, power pack over LEFT chest. EKG leads project over the chest. Cardiomediastinal contours again with cardiac enlargement not changed from previous imaging. Central pulmonary vascular engorgement without frank edema. No consolidation. No  visible pneumothorax. On limited assessment no acute skeletal findings. IMPRESSION: No active disease. Electronically Signed   By: Zetta Bills M.D.   On: 02/08/2022 10:43    Cardiac Studies   LHC 12/24/21    Dist LM to Ost LAD lesion is 90% stenosed.   Prox Cx lesion is 30% stenosed.   Mid Cx to Dist Cx lesion is 40% stenosed.   3rd Mrg lesion is 50% stenosed.   Mid Cx lesion is 30% stenosed.   Ost RCA to Prox RCA lesion is 70% stenosed.   Previously placed 2nd Diag stent of unknown type is  widely patent.   Post intervention, there is a 0% residual stenosis.  NSTEMI secondary to focal 90% proximal in-stent restenosis with thrombus and probable stent underexpansion.   Mild residual nonobstructive CAD in the left circumflex vessel with 30, 40 and 50% stenoses.  A previously placed stent in the OM3 vessel was difficult to visualize.   No significant change in the previous 70% proximal RCA stenosis in nondominant RCA vessel.   OCT demonstrating prior LAD stent underexpansion ostially with calcification and plaque.   Difficult but successful PCI to the ostium proximal LAD stent treated with PTCA with a 2.5 x 12 mm, 3.0 x 12 mm compliant balloon, 3.5 and 4.0 x 15 mm noncompliant balloons.   RECOMMENDATION: DAPT indefinitely.  Medical therapy for concomitant CAD.  The patient is on dialysis on Monday, Wednesday, and Fridays.  Aggressive lipid-lowering therapy with target LDL less than 55.  Diagnostic Dominance: Right  Intervention    Patient Profile     88 y.o. male with a hx of chronic systolic heart failure, diabetes with neuropathy, coronary artery disease Status post non-STEMI who is being seen 02/08/2022 for the evaluation of non-STEMI at the request of Isla Pence.  Assessment & Plan    NSTEMI CAD s/p prior stenting to LAD (6/23) and OM 3 --High-sensitivity troponin 464>> 498, plan for cardiac catheterization today. Recent cardiac cath 6/23 noted above. Did have some  chest discomfort this morning which resolved without intervention.  Remains on IV heparin. --Continue aspirin, Plavix, atorvastatin 80mg  daily   ESRD on HD: Followed by nephrology, on Monday Wednesday Friday schedule. --Per nephrology  Hypotension: Unable to tolerate any blood pressure medications.  Takes midodrine on HD days  Hyperlipidemia: LDL 79, HDL 41 --Continue atorvastatin 80 mg daily  HFrEF Ischemic cardiomyopathy S/p CRT-D --Most recent echo with LVEF of 45 to 50%.  Volume management per HD. -- AV  paced on telemetry  Anemia: baseline Hgb around 11, dropped to 9.7 this morning. No reported bleeding issues overnight.  -- follow CBC  For questions or updates, please contact [place] Please consult [email or web address] for contact info under        Signed, Reino Bellis, NP  02/09/2022, 8:14 AM    ATTENDING ATTESTATION:  After conducting a review of all available clinical information with the care team, interviewing the patient, and performing a physical exam, I agree with the findings and plan described in this note.   GEN: No acute distress.   HEENT:  MMM, no JVD, no scleral icterus Cardiac: RRR, no murmurs, rubs, or gallops.  Respiratory: Clear to auscultation bilaterally. GI: Soft, nontender, non-distended  MS: No edema; No deformity. Neuro:  Nonfocal  Vasc:  RFA sheaths in place; RUE fistula in place  Patient underwent coronary angiography which unfortunately demonstrated in-stent restenosis of a previously placed underexpanded stent.  This area underwent high-pressure balloon dilatation recently with OCT guidance.  Today Dr. Saunders Revel performed IVUS guided lithotripsy without the placement of additional stent.  I think this was the appropriate thing to do as further stenting would exacerbate the situation due to initial stent underexpansion.  I am hopeful with lithotripsy and high-pressure balloon inflation performed today that we can get some durable patency.  His  filling pressures were elevated so dialysis should be pursued perhaps after his sheaths are out.  Continue DAPT and statin.  Consider BB given elevated BP.  Lenna Sciara, MD Pager [phone]

## 2022-02-09 NOTE — Interval H&P Note (Signed)
History and Physical Interval Note:  02/09/2022 9:01 AM  [name]  has presented today for surgery, with the diagnosis of NSTEMI.  The various methods of treatment have been discussed with the patient and family. After consideration of risks, benefits and other options for treatment, the patient has consented to  Procedure(s): LEFT HEART CATH AND CORONARY ANGIOGRAPHY (N/A) as a surgical intervention.  [name] history has been reviewed, patient examined, no change in status, stable for surgery.  I have reviewed the patient's chart and labs.  Due to concern about moderate aortic stenosis on echo in May and persistent symptoms, we have agreed to perform right and left heart catheterization with attempt to cross the valve to better understand the degree of stenosis.  Questions were answered to the patient's satisfaction.  He has agreed to rescind his DNR order for the procedure and will be made full code until completion of the cardiac catheterization.  Cath Lab Visit (complete for each Cath Lab visit)  Clinical Evaluation Leading to the Procedure:   ACS: Yes.    Non-ACS:  N/A  Rodney Farley

## 2022-02-09 NOTE — Progress Notes (Addendum)
ANTICOAGULATION CONSULT NOTE - Follow Up Consult  Pharmacy Consult for Heparin Indication: chest pain/ACS  Allergies  Allergen Reactions   Bee Venom Anaphylaxis   Lyrica [Pregabalin] Other (See Comments)    hallucinations   Prednisone Other (See Comments)    hallucinations   Zocor [Simvastatin] Nausea Only and Other (See Comments)    Headache with brand name only.  Can take the generic.    Patient Measurements: Height: _0  (170.2 cm) Weight: 98.8 kg (217 lb 13 oz) IBW/kg (Calculated) : 66.1 Heparin Dosing Weight: 86.7 kg  Vital Signs: Temp: 97.7 F (36.5 C) (07/30 2313) Temp Source: Oral (07/30 2313) BP: 119/58 (07/30 2313) Pulse Rate: 81 (07/30 1757)  Labs: Recent Labs    02/08/22 0956 02/08/22 1011 02/08/22 1247 02/08/22 2025 02/09/22 0410  HGB 11.3*  --   --   --  9.7*  HCT 33.4*  --   --   --  29.7*  PLT 166  --   --   --  150  LABPROT  --  12.8  --   --   --   INR  --  1.0  --   --   --   HEPARINUNFRC  --   --   --  0.22* 0.27*  CREATININE 7.49*  --   --   --  8.91*  TROPONINIHS 464*  --  498*  --   --      Estimated Creatinine Clearance: 6.4 mL/min (A) (by C-G formula based on SCr of 8.91 mg/dL (H)).   Medical History: Past Medical History:  Diagnosis Date   AICD (automatic cardioverter/defibrillator) present    [employer] pacer   Anemia    Anxiety and depression    Arthritis    Bell's palsy    left side. after shingles episode   BPH associated with nocturia    Chronic systolic CHF (congestive heart failure) ([place])    EF normalized by Echo 2019   COPD (chronic obstructive pulmonary disease) ([place])    Severe   Coronary artery disease    a. s/p MI in 1994/1995 while in [place] s/p questionable PCI. 03/2015: progression of disease, for staged PCI.   Diabetic peripheral neuropathy (HCC)    GERD (gastroesophageal reflux disease)    Gout    Hard of hearing    B/L   History of chronic pancreatitis 07/23/2017   noted on CT abd/pelvis   History of  shingles    Hypercholesterolemia    Hypertension    Obesity    Sleep apnea    "sleeps w/humidifyer when he panics and gets short of breath" (04/08/2015)   TIA (transient ischemic attack) X 3   Trigger middle finger of left hand    Type II diabetes mellitus ([place])    Wears glasses    Wears hearing aid     Medications:  Medications Prior to Admission  Medication Sig Dispense Refill Last Dose   aspirin EC 81 MG tablet Take 81 mg by mouth daily.   02/08/2022   atorvastatin (LIPITOR) 80 MG tablet Take 1 tablet (80 mg total) by mouth daily. (Patient taking differently: Take 80 mg by mouth at bedtime.) 90 tablet 3 02/07/2022   calcitRIOL (ROCALTROL) 0.25 MCG capsule Take 7 capsules (1.75 mcg total) by mouth every Monday, Wednesday, and Friday with hemodialysis. 100 capsule 3 Past Week   carbamide peroxide (DEBROX) 6.5 % OTIC solution Place 5 drops into both ears 2 (two) times daily as needed (earwax).   unk  Carboxymethylcellul-Glycerin (LUBRICATING EYE DROPS OP) Place 1 drop into both eyes 3 (three) times daily as needed (dry eyes).   Past Week   clopidogrel (PLAVIX) 75 MG tablet Take 1 tablet (75 mg total) by mouth daily with breakfast. 90 tablet 3 02/08/2022   gabapentin (NEURONTIN) 300 MG capsule Take 1 capsule (300 mg total) by mouth 2 (two) times daily.   02/07/2022   midodrine (PROAMATINE) 10 MG tablet Take 1 tablet (10 mg) three times a week. Add 5 mg a night after dialysis as needed for low blood pressure. (Patient taking differently: Take 5-10 mg by mouth See admin instructions. Take 1 tablet (10 mg) three times a week. Add 5 mg a night after dialysis as needed for low blood pressure.) 24 tablet 3 02/07/2022   multivitamin (RENA-VIT) TABS tablet Take 1 tablet by mouth daily. 90 tablet 3 02/07/2022   nitroGLYCERIN (NITROSTAT) 0.4 MG SL tablet Place 1 tablet (0.4 mg total) under the tongue every 5 (five) minutes x 3 doses as needed for chest pain. 25 tablet 12 Past Week   oxymetazoline (AFRIN)  0.05 % nasal spray Place 1 spray into both nostrils 2 (two) times daily as needed for congestion. 30 mL 0 unk   pantoprazole (PROTONIX) 40 MG tablet Take 1 tablet by mouth daily. 90 tablet 3 02/07/2022   saccharomyces boulardii (FLORASTOR) 250 MG capsule Take 1 capsule (250 mg total) by mouth 2 (two) times daily. 30 capsule 0 02/07/2022   sevelamer carbonate (RENVELA) 800 MG tablet Take 2 tablets (1,600 mg total) by mouth 3 (three) times daily with meals. (Patient taking differently: Take 1,600 mg by mouth See admin instructions. Take 2 tablets by mouth three times daily with meals and 1 tablet with snacks)   02/07/2022   vitamin B-12 (CYANOCOBALAMIN) 1000 MCG tablet Take 1,000 mcg by mouth daily.   02/07/2022   glucose blood (FREESTYLE TEST STRIPS) test strip Use to check blood sugar daily 100 each 3    glucose monitoring kit (FREESTYLE) monitoring kit USE TO MONITOR BLOOD GLUCOSE AS DIRECTED 1 each 1    Methoxy PEG-Epoetin Beta (MIRCERA IJ) as directed.      Scheduled:   aspirin  81 mg Oral Pre-Cath   atorvastatin  80 mg Oral QHS   calcitRIOL  1.75 mcg Oral Q M,W,F-HD   Chlorhexidine Gluconate Cloth  6 each Topical Q0600   clopidogrel  75 mg Oral Q breakfast   gabapentin  300 mg Oral BID   insulin aspart  0-6 Units Subcutaneous TID WC   midodrine  10 mg Oral Q M,W,F   multivitamin  1 tablet Oral Daily   pantoprazole  40 mg Oral Daily   sevelamer carbonate  1,600 mg Oral TID WC   sevelamer carbonate  800 mg Oral With snacks   sodium chloride flush  3 mL Intravenous Q12H   sodium chloride flush  3 mL Intravenous Q12H   sodium zirconium cyclosilicate  10 g Oral BID   Infusions:   sodium chloride     sodium chloride     heparin 1,350 Units/hr (02/08/22 2222)   PRN: sodium chloride, acetaminophen **OR** acetaminophen, calcium carbonate (dosed in mg elemental calcium), camphor-menthol **AND** hydrOXYzine, carboxymethylcellul-glycerin, docusate sodium, feeding supplement (NEPRO CARB STEADY),  hydrALAZINE, midodrine, nitroGLYCERIN, ondansetron **OR** ondansetron (ZOFRAN) IV, sodium chloride flush, sorbitol, zolpidem  Assessment: 21 yom with a history of HTN, hypercholesterolemia, complete heart block w/ pacemaker, DM, ESRD MWF HD, HF, and recent NSTEMI. Patient is presenting with multiple day  chest pain. Heparin per pharmacy consult placed for chest pain/ACS.  Patient is not on anticoagulation prior to arrival.  Heparin level of 0.27 is subtherapeutic on heparin 1350 units/hr. No issues with IV access or infusion. Hgb 9.7. Plt 150. No bleeding noted.  Goal of Therapy:  Heparin level 0.3-0.7 units/ml Monitor platelets by anticoagulation protocol: Yes   Plan:  Increase heparin infusion to 1450 units/hr Check 8 hr heparin level  Daily heparin level and CBC  Monitor s/s bleeding    Cristela Felt, PharmD, BCPS Clinical Pharmacist 02/09/2022 5:32 AM

## 2022-02-09 NOTE — Progress Notes (Addendum)
Site area: Right groin a 6 french arterial  and 7 french venous  sheath was removed  Site Prior to Removal:  Level 0  Pressure Applied For 20 MINUTES    Bedrest Beginning at 1530p X 4 hours  Manual:   Yes.    Patient Status During Pull:  stable  Post Pull Groin Site:  Level 0  Post Pull Instructions Given:  Yes.    Post Pull Pulses Present:  Yes.    Dressing Applied:  Yes.    Comments:

## 2022-02-09 NOTE — Progress Notes (Signed)
PROGRESS NOTE    Rodney Farley  UXN:[id number] DOB: [birth year] DOA: 02/08/2022 PCP: [name], MD  Outpatient Specialists: cardiology, nephrology    Brief Narrative:   From admission h and p Rodney Farley is a 88 y.o. male with medical history significant of chronic systolic CHF with AICD placement; BPH; COPD; CAD s/p stent; depression/anxiety; HTN; HLD; OSA; ESRD on MWF HD; and DM presenting with chest pain.  He reports that he couldn't stand the pain any longer - chest, L shoulder, L bicep.  He has been having "little attacks that were subdued by nitroglycerin tablets".  About 4AM Saturday "it started a really really wretched day" with attacks that were more intense.  They believe he was having angina but he thinks he then had a heart attack yesterday.  It was similar to prior index symptoms, had "serious ones not too long ago."  He took NTG all day yesterday he had another bad episode overnight and took NTG and neurontin with improved pain until it recurred again today.  He had a stent placed on 5/3 and another on 6/14. Moderate AS on recent cardiac scoring evaluation.  He did have normal HD Friday.   ER Course:  MIs and intermittent CP since May.  Worse this AM.  Took NTG and neurontin without improvement.  Went to DB and had troponin elevation.  Cardiology consulted and wants medicine admission.  On Heparin, possible repeat cath.     Assessment & Plan:   Principal Problem:   NSTEMI (non-ST elevated myocardial infarction) ([place]) Active Problems:   Hypotension   Hyperlipidemia   Type II diabetes mellitus (HCC)   Obesity (BMI 30-39.9)   Ischemic cardiomyopathy   Pacemaker   Sleep apnea   COPD (chronic obstructive pulmonary disease) ([place])   ESRD on dialysis ([place])   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   Chronic systolic CHF (congestive heart failure) ([place])   # CAD # NSTEMI Hx multiple percutaneous interventions. Here with worsening  angina, elevated troponin. S/p left heart cath earlier today where ostial/proximal LAD in-stent restenosis was found, lithotripsy performed. - cardiology following - indefinite dapt - pt consult tomorrow  # HTN No longer on meds, now treated with midodrine w/ dialsyis  # ESRD On mwf hemodialysis, nephrology is following - hemodialysis later today if possible  # T2DM Not on meds at home. Glucose controlled - SSI  # HFrEF Ef 45-50. Appears slightly volume up - volume mgmt with dialysis   DVT prophylaxis: heparin Code Status: dnr Family Communication: son updated telephonically 7/31  Level of care: Progressive Status is: Inpatient Remains inpatient appropriate because: severity of illness    Consultants:  Cardiology, nephrology  Procedures: [facility] 7/31  Antimicrobials:  none    Subjective: No chest pain, breathing comfortably  Objective: Vitals:   02/09/22 1415 02/09/22 1430 02/09/22 1445 02/09/22 1500  BP: (!) 147/66 (!) 145/59 (!) 141/58 (!) 136/49  Pulse: 79 80 79 80  Resp: 14 15 20 18   Temp:      TempSrc:      SpO2: 97% 98% 97% 99%  Weight:      Height:        Intake/Output Summary (Last 24 hours) at 02/09/2022 1543 Last data filed at 02/09/2022 0837 Gross per 24 hour  Intake 256.22 ml  Output --  Net 256.22 ml   Filed Weights   02/08/22 1757 02/08/22 2313 02/09/22 0500  Weight: 97.1 kg 98.8 kg 98.5 kg    Examination:  General exam: Appears calm and comfortable  Respiratory system: Clear to auscultation. Respiratory effort normal. Cardiovascular system: S1 & S2 heard, RRR. Soft systolic murmur Gastrointestinal system: Abdomen is nondistended, soft and nontender. No organomegaly or masses felt. Normal bowel sounds heard. Central nervous system: Alert and oriented. No focal neurological deficits. Extremities: Symmetric 5 x 5 power. Skin: No rashes, lesions or ulcers Psychiatry: Judgement and insight appear normal. Mood & affect appropriate.      Data Reviewed: I have personally reviewed following labs and imaging studies  CBC: Recent Labs  Lab 02/08/22 0956 02/09/22 0410 02/09/22 0937 02/09/22 0939  WBC 9.9 7.6  --   --   HGB 11.3* 9.7* 10.5* 10.5*  HCT 33.4* 29.7* 31.0* 31.0*  MCV 105.0* 106.8*  --   --   PLT 166 150  --   --    Basic Metabolic Panel: Recent Labs  Lab 02/08/22 0956 02/08/22 2025 02/09/22 0410 02/09/22 0937 02/09/22 0939  NA 140  --  140 136 137  K 5.2*  --  5.3* 5.6* 5.5*  CL 93*  --  94*  --   --   CO2 28  --  29  --   --   GLUCOSE 179*  --  170*  --   --   BUN 31*  --  38*  --   --   CREATININE 7.49*  --  8.91*  --   --   CALCIUM 10.5*  --  9.3  --   --   PHOS  --  6.7*  --   --   --    GFR: Estimated Creatinine Clearance: 6.4 mL/min (A) (by C-G formula based on SCr of 8.91 mg/dL (H)). Liver Function Tests: Recent Labs  Lab 02/08/22 0956  AST 17  ALT 25  ALKPHOS 81  BILITOT 0.4  PROT 7.0  ALBUMIN 4.4   Recent Labs  Lab 02/08/22 0956  LIPASE 50   No results for input(s): "AMMONIA" in the last 168 hours. Coagulation Profile: Recent Labs  Lab 02/08/22 1011  INR 1.0   Cardiac Enzymes: No results for input(s): "CKTOTAL", "CKMB", "CKMBINDEX", "TROPONINI" in the last 168 hours. BNP (last 3 results) No results for input(s): "PROBNP" in the last 8760 hours. HbA1C: No results for input(s): "HGBA1C" in the last 72 hours. CBG: Recent Labs  Lab 02/08/22 1810 02/09/22 0807  GLUCAP 129* 140*   Lipid Profile: No results for input(s): "CHOL", "HDL", "LDLCALC", "TRIG", "CHOLHDL", "LDLDIRECT" in the last 72 hours. Thyroid Function Tests: No results for input(s): "TSH", "T4TOTAL", "FREET4", "T3FREE", "THYROIDAB" in the last 72 hours. Anemia Panel: No results for input(s): "VITAMINB12", "FOLATE", "FERRITIN", "TIBC", "IRON", "RETICCTPCT" in the last 72 hours. Urine analysis:    Component Value Date/Time   COLORURINE YELLOW 04/07/2019 2001   APPEARANCEUR CLOUDY (A)  04/07/2019 2001   LABSPEC 1.015 04/07/2019 2001   PHURINE 5.0 04/07/2019 2001   GLUCOSEU 50 (A) 04/07/2019 2001   GLUCOSEU NEGATIVE 03/25/2017 1309   HGBUR MODERATE (A) 04/07/2019 2001   BILIRUBINUR NEGATIVE 04/07/2019 2001   BILIRUBINUR negative 04/04/2015 1229   BILIRUBINUR neg 02/27/2015 [place] 04/07/2019 2001   PROTEINUR >=300 (A) 04/07/2019 2001   UROBILINOGEN 0.2 03/25/2017 1309   NITRITE NEGATIVE 04/07/2019 2001   LEUKOCYTESUR LARGE (A) 04/07/2019 2001   Sepsis Labs: @LABRCNTIP (procalcitonin:4,lacticidven:4)  )No results found for this or any previous visit (from the past 240 hour(s)).       Radiology Studies: CARDIAC CATHETERIZATION  Result  Date: 02/09/2022 Conclusions: Significant single-vessel coronary artery disease with 90% in-stent restenosis (minimal luminal area 2.9 mm) at site of stent placement in 11/2021 and repeat angioplasty in 12/2021. Moderate LCx disease that appears stable from prior catheterizations. Stable 70% proximal stenosis of nondominant RCA Patent D2 stent with 30% stenosis at the ostium of D2 before the stent.  Reported OM stent is not clearly visualized. Moderately to severely elevated left and right heart filling pressures (PCWP 33 mmHg, mean RA 14 mmHg). Normal to supranormal cardiac output, likely accentuated by dialysis fistula. Successful PTCA with intracoronary lithotripsy of the ostial/proximal LAD in-stent restenosis with reduction in stenosis from 90% to 20% and improvement in flow from TIMI-2 to TIMI-3.  Additional stent placement at the ostium of the LAD was not pursued given inability to completely expand the vessel despite lithotripsy and high-pressure angioplasty. Recommendations: Remove right femoral artery and venous sheaths after ACT has fallen below 175 seconds with manual compression. Continue indefinite dual antiplatelet therapy with aspirin and clopidogrel. Aggressive secondary prevention of coronary artery disease.  Proceed with hemodialysis later today, if possible, given elevated filling pressures. Limited echo to reassess LVEF and aortic stenosis. Nelva Bush, MD [facility]  PERIPHERAL VASCULAR CATHETERIZATION  Result Date: 02/09/2022 Conclusions: Significant single-vessel coronary artery disease with 90% in-stent restenosis (minimal luminal area 2.9 mm) at site of stent placement in 11/2021 and repeat angioplasty in 12/2021. Moderate LCx disease that appears stable from prior catheterizations. Stable 70% proximal stenosis of nondominant RCA Patent D2 stent with 30% stenosis at the ostium of D2 before the stent.  Reported OM stent is not clearly visualized. Moderately to severely elevated left and right heart filling pressures (PCWP 33 mmHg, mean RA 14 mmHg). Normal to supranormal cardiac output, likely accentuated by dialysis fistula. Successful PTCA with intracoronary lithotripsy of the ostial/proximal LAD in-stent restenosis with reduction in stenosis from 90% to 20% and improvement in flow from TIMI-2 to TIMI-3.  Additional stent placement at the ostium of the LAD was not pursued given inability to completely expand the vessel despite lithotripsy and high-pressure angioplasty. Recommendations: Remove right femoral artery and venous sheaths after ACT has fallen below 175 seconds with manual compression. Continue indefinite dual antiplatelet therapy with aspirin and clopidogrel. Aggressive secondary prevention of coronary artery disease. Proceed with hemodialysis later today, if possible, given elevated filling pressures. Limited echo to reassess LVEF and aortic stenosis. Nelva Bush, MD [facility]  DG Chest Port 1 View  Result Date: 02/08/2022 CLINICAL DATA:  A male at age 8 presents for evaluation of chest pain that began last night. EXAM: PORTABLE CHEST 1 VIEW COMPARISON:  January 21, 2022. FINDINGS: Multi lead pacer defibrillator remains in place, power pack over LEFT chest. EKG leads project over  the chest. Cardiomediastinal contours again with cardiac enlargement not changed from previous imaging. Central pulmonary vascular engorgement without frank edema. No consolidation. No visible pneumothorax. On limited assessment no acute skeletal findings. IMPRESSION: No active disease. Electronically Signed   By: Zetta Bills M.D.   On: 02/08/2022 10:43        Scheduled Meds:  [MAR Hold] aspirin  81 mg Oral Daily   [MAR Hold] atorvastatin  80 mg Oral QHS   [MAR Hold] calcitRIOL  1.75 mcg Oral Q M,W,F-HD   [MAR Hold] Chlorhexidine Gluconate Cloth  6 each Topical Q0600   [MAR Hold] clopidogrel  75 mg Oral Q breakfast   [MAR Hold] gabapentin  300 mg Oral BID   heparin  5,000 Units Subcutaneous  Q8H   [MAR Hold] insulin aspart  0-6 Units Subcutaneous TID WC   [MAR Hold] midodrine  10 mg Oral Q M,W,F   [MAR Hold] multivitamin  1 tablet Oral Daily   [MAR Hold] pantoprazole  40 mg Oral Daily   [MAR Hold] sevelamer carbonate  1,600 mg Oral TID WC   [MAR Hold] sevelamer carbonate  800 mg Oral With snacks   [MAR Hold] sodium chloride flush  3 mL Intravenous Q12H   [MAR Hold] sodium chloride flush  3 mL Intravenous Q12H   sodium chloride flush  3 mL Intravenous Q12H   Continuous Infusions:  sodium chloride       LOS: 1 day     Desma Maxim, MD [facility]   If 7PM-7AM, please contact night-coverage [email or web address] Password [facility] 02/09/2022, 3:43 PM

## 2022-02-10 ENCOUNTER — Encounter (HOSPITAL_COMMUNITY): Payer: Self-pay | Admitting: Internal Medicine

## 2022-02-10 ENCOUNTER — Other Ambulatory Visit (HOSPITAL_COMMUNITY): Payer: Self-pay

## 2022-02-10 ENCOUNTER — Other Ambulatory Visit: Payer: Self-pay

## 2022-02-10 ENCOUNTER — Other Ambulatory Visit (HOSPITAL_COMMUNITY): Payer: PPO

## 2022-02-10 DIAGNOSIS — I214 Non-ST elevation (NSTEMI) myocardial infarction: Secondary | ICD-10-CM | POA: Diagnosis not present

## 2022-02-10 LAB — BASIC METABOLIC PANEL
Anion gap: 9 (ref 5–15)
BUN: 20 mg/dL (ref 8–23)
CO2: 31 mmol/L (ref 22–32)
Calcium: 9.1 mg/dL (ref 8.9–10.3)
Chloride: 95 mmol/L — ABNORMAL LOW (ref 98–111)
Creatinine, Ser: 5.58 mg/dL — ABNORMAL HIGH (ref 0.61–1.24)
GFR, Estimated: 9 mL/min — ABNORMAL LOW (ref >60)
Glucose, Bld: 93 mg/dL (ref 70–99)
Potassium: 4.6 mmol/L (ref 3.5–5.1)
Sodium: 135 mmol/L (ref 135–145)

## 2022-02-10 LAB — CBC
HCT: 30.2 % — ABNORMAL LOW (ref 39.0–52.0)
Hemoglobin: 10 g/dL — ABNORMAL LOW (ref 13.0–17.0)
MCH: 34.8 pg — ABNORMAL HIGH (ref 26.0–34.0)
MCHC: 33.1 g/dL (ref 30.0–36.0)
MCV: 105.2 fL — ABNORMAL HIGH (ref 80.0–100.0)
Platelets: 162 10*3/uL (ref 150–400)
RBC: 2.87 MIL/uL — ABNORMAL LOW (ref 4.22–5.81)
RDW: 15.1 % (ref 11.5–15.5)
WBC: 8.1 10*3/uL (ref 4.0–10.5)
nRBC: 0 % (ref 0.0–0.2)

## 2022-02-10 LAB — GLUCOSE, CAPILLARY
Glucose-Capillary: 106 mg/dL — ABNORMAL HIGH (ref 70–99)
Glucose-Capillary: 150 mg/dL — ABNORMAL HIGH (ref 70–99)

## 2022-02-10 NOTE — Progress Notes (Addendum)
[facility] Progress Note   Subjective: Patient seen and examined at bedside.   LHC showed in stent restenosis s/p lithotripsy.  Patient feeling much better today.  "I could take on a giant."  Hoping to go home.  Denies CP, SOB, abdominal pain, n/v/d, dizziness and fatigue.   Objective Vitals:   02/09/22 2140 02/09/22 2144 02/09/22 2236 02/10/22 0438  BP: 99/67 114/60 107/70 107/74  Pulse: 80 80 79 64  Resp: (!) 21 (!) 22 20 17   Temp: 97.9 F (36.6 C) 97.9 F (36.6 C) (!) 97.3 F (36.3 C) 98.1 F (36.7 C)  TempSrc: Oral Oral Oral Oral  SpO2: 94% 94% 95% 91%  Weight:  96.8 kg  97.4 kg  Height:       Physical Exam General:well appearing, pleasant, elderly male in NAD Heart:RRR, +4/4 systolic murmur Lungs:CTAB, nml WOB on RA Abdomen:soft, NTND Extremities:no LE edema Dialysis Access: RU AVF +b/t   Filed Weights   02/09/22 0500 02/09/22 2144 02/10/22 0438  Weight: 98.5 kg 96.8 kg 97.4 kg    Intake/Output Summary (Last 24 hours) at 02/10/2022 0833 Last data filed at 02/09/2022 2352 Gross per 24 hour  Intake 589.08 ml  Output 1500 ml  Net -910.92 ml    Additional Objective Labs: Basic Metabolic Panel: Recent Labs  Lab 02/08/22 0956 02/08/22 2025 02/09/22 0410 02/09/22 0937 02/09/22 0939 02/10/22 0433  NA 140  --  140 136 137 135  K 5.2*  --  5.3* 5.6* 5.5* 4.6  CL 93*  --  94*  --   --  95*  CO2 28  --  29  --   --  31  GLUCOSE 179*  --  170*  --   --  93  BUN 31*  --  38*  --   --  20  CREATININE 7.49*  --  8.91*  --   --  5.58*  CALCIUM 10.5*  --  9.3  --   --  9.1  PHOS  --  6.7*  --   --   --   --    Liver Function Tests: Recent Labs  Lab 02/08/22 0956  AST 17  ALT 25  ALKPHOS 81  BILITOT 0.4  PROT 7.0  ALBUMIN 4.4   Recent Labs  Lab 02/08/22 0956  LIPASE 50   CBC: Recent Labs  Lab 02/08/22 0956 02/09/22 0410 02/09/22 0937 02/09/22 0939 02/10/22 0433  WBC 9.9 7.6  --   --  8.1  HGB 11.3* 9.7* 10.5* 10.5* 10.0*  HCT  33.4* 29.7* 31.0* 31.0* 30.2*  MCV 105.0* 106.8*  --   --  105.2*  PLT 166 150  --   --  162    Studies/Results: CARDIAC CATHETERIZATION  Result Date: 02/09/2022 Conclusions: Significant single-vessel coronary artery disease with 90% in-stent restenosis (minimal luminal area 2.9 mm) at site of stent placement in 11/2021 and repeat angioplasty in 12/2021. Moderate LCx disease that appears stable from prior catheterizations. Stable 70% proximal stenosis of nondominant RCA Patent D2 stent with 30% stenosis at the ostium of D2 before the stent.  Reported OM stent is not clearly visualized. Moderately to severely elevated left and right heart filling pressures (PCWP 33 mmHg, mean RA 14 mmHg). Normal to supranormal cardiac output, likely accentuated by dialysis fistula. Successful PTCA with intracoronary lithotripsy of the ostial/proximal LAD in-stent restenosis with reduction in stenosis from 90% to 20% and improvement in flow from TIMI-2 to TIMI-3.  Additional stent placement at the  ostium of the LAD was not pursued given inability to completely expand the vessel despite lithotripsy and high-pressure angioplasty. Recommendations: Remove right femoral artery and venous sheaths after ACT has fallen below 175 seconds with manual compression. Continue indefinite dual antiplatelet therapy with aspirin and clopidogrel. Aggressive secondary prevention of coronary artery disease. Proceed with hemodialysis later today, if possible, given elevated filling pressures. Limited echo to reassess LVEF and aortic stenosis. Nelva Bush, MD [facility]  PERIPHERAL VASCULAR CATHETERIZATION  Result Date: 02/09/2022 Conclusions: Significant single-vessel coronary artery disease with 90% in-stent restenosis (minimal luminal area 2.9 mm) at site of stent placement in 11/2021 and repeat angioplasty in 12/2021. Moderate LCx disease that appears stable from prior catheterizations. Stable 70% proximal stenosis of nondominant RCA  Patent D2 stent with 30% stenosis at the ostium of D2 before the stent.  Reported OM stent is not clearly visualized. Moderately to severely elevated left and right heart filling pressures (PCWP 33 mmHg, mean RA 14 mmHg). Normal to supranormal cardiac output, likely accentuated by dialysis fistula. Successful PTCA with intracoronary lithotripsy of the ostial/proximal LAD in-stent restenosis with reduction in stenosis from 90% to 20% and improvement in flow from TIMI-2 to TIMI-3.  Additional stent placement at the ostium of the LAD was not pursued given inability to completely expand the vessel despite lithotripsy and high-pressure angioplasty. Recommendations: Remove right femoral artery and venous sheaths after ACT has fallen below 175 seconds with manual compression. Continue indefinite dual antiplatelet therapy with aspirin and clopidogrel. Aggressive secondary prevention of coronary artery disease. Proceed with hemodialysis later today, if possible, given elevated filling pressures. Limited echo to reassess LVEF and aortic stenosis. Nelva Bush, MD [facility]  DG Chest Port 1 View  Result Date: 02/08/2022 CLINICAL DATA:  A male at age 37 presents for evaluation of chest pain that began last night. EXAM: PORTABLE CHEST 1 VIEW COMPARISON:  January 21, 2022. FINDINGS: Multi lead pacer defibrillator remains in place, power pack over LEFT chest. EKG leads project over the chest. Cardiomediastinal contours again with cardiac enlargement not changed from previous imaging. Central pulmonary vascular engorgement without frank edema. No consolidation. No visible pneumothorax. On limited assessment no acute skeletal findings. IMPRESSION: No active disease. Electronically Signed   By: Zetta Bills M.D.   On: 02/08/2022 10:43    Medications:   aspirin  81 mg Oral Daily   atorvastatin  80 mg Oral QHS   calcitRIOL  1.75 mcg Oral Q M,W,F-HD   Chlorhexidine Gluconate Cloth  6 each Topical Q0600   clopidogrel   75 mg Oral Q breakfast   gabapentin  300 mg Oral BID   heparin  5,000 Units Subcutaneous Q8H   insulin aspart  0-6 Units Subcutaneous TID WC   midodrine  10 mg Oral Q M,W,F   multivitamin  1 tablet Oral Daily   pantoprazole  40 mg Oral Daily   sevelamer carbonate  1,600 mg Oral TID WC   sevelamer carbonate  800 mg Oral With snacks   sodium chloride flush  3 mL Intravenous Q12H    Dialysis Orders: MWF [facility]  4h  400/500  96.5kg   2/2 bath P2   Heparin 2500 + 1078midrun  RUA AVF - last HD 7/28, post wt 96.0 - last Hb 10.3 - mircera 50 q 2wks, last 7/26, due 8/09 - calcitriol 1.75 ug tiw po w hd    Assessment/ Plan: CP/ NSTEMI - better on IV heparin. Hx recent PCI/ stenting in May and again in June.  LHC with in-stent restenosis s/p lithotripsy.  Per cardiology. ESRD - on HD MWF. HD tomorrow per regular schedule. Can be completed at OP unit if d/c today.  Hypotension - takes midodrine 10 mg pre HD on mwf, and 5mg  prn for hypotension at home.   Hyperkalemia - resolved. Vol - no vol excess on exam, CXR no edema. Close to dry wt.  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  Cont renvela as binder and po vdra w/ hd tiw.  DM2  Chronic syst CHF/ sp AICD - last EF 40-45%  Dispo - ok for d/c from renal standpoint  Jen Mow, PA-C [place] 02/10/2022,8:33 AM  LOS: 2 days   Nephrology attending: I have personally seen and examined the patient.  Chart reviewed.  I agree with above. Status post cardiac cath and dilatation of coronary stent restenosis.  On dual antiplatelet regimen.  Clinically improved with no chest pain.  Tolerated dialysis well.  Plan for next HD tomorrow outpatient.  He is being discharged today.  Katheran James, [facility].

## 2022-02-10 NOTE — Progress Notes (Signed)
Pt d/c to home today. Contacted [facility] to advise clinic of pt's d/c today and that pt should resume care tomorrow.   Melven Sartorius Renal Navigator [phone]

## 2022-02-10 NOTE — Discharge Summary (Signed)
DISCHARGE SUMMARY  Rodney Farley  MR#: [id number]  DOB:1931-08-26  Date of Admission: 02/08/2022 Date of Discharge: 02/10/2022  Attending Physician:Minette Manders Hennie Duos, MD  Patient's VXB:[id number], Rodney Mars, MD  Consults: Nephrology  Cardiology   Disposition: D/C home   Follow-up Appts:  Follow-up Information     Rodney Farley, Rodney M, MD Follow up on 02/17/2022.   Specialty: Cardiology Why: at 8:30am for your follow up appt Contact information: [name] [address] [phone]         [name], MD Follow up.   Specialty: Family Medicine Contact information: [address] [place] Rod[place] Far[place] [address] [phone]                Discharge Diagnoses: Coronary artery disease requiring acute intervention Chronic systolic CHF without acute exacerbation ESRD on hemodialysis DM2 controlled without use of medications Hyperlipidemia BPH COPD well compensated  Initial presentation: 88 year old with a history of chronic systolic CHF status post AICD, BPH, COPD, CAD status post stenting, HTN, HLD, OSA, ESRD on MWF HD, and diabetes who presented to the ER with chest pain.  This has been a progressive problem which have become more frequent.  He reported the pain did respond well to the use of nitroglycerin.  Hospital Course: After admission the patient was found to have elevated troponins.  Cardiology was consulted.  Patient underwent a left heart cath noting an ostial/proximal LAD in-stent restenosis which was treated with lithotripsy.  Cardiology has recommended indefinite dual antiplatelet therapy.  Patient tolerated the procedure very well.  His symptoms were resolved post catheterization and the patient was anxious to be discharged home.  [name] multiple other medical issues were stable during this hospital stay.  Nephrology did attend to his dialysis needs while he was admitted.  Allergies as of 02/10/2022        Reactions   Bee Venom Anaphylaxis   Lyrica [pregabalin] Other (See Comments)   hallucinations   Prednisone Other (See Comments)   hallucinations   Zocor [simvastatin] Nausea Only, Other (See Comments)   Headache with brand name only.  Can take the generic.        Medication List     TAKE these medications    aspirin EC 81 MG tablet Take 81 mg by mouth daily.   atorvastatin 80 MG tablet Commonly known as: LIPITOR Take 1 tablet (80 mg total) by mouth daily. What changed: when to take this   calcitRIOL 0.25 MCG capsule Commonly known as: ROCALTROL Take 7 capsules (1.75 mcg total) by mouth every Monday, Wednesday, and Friday with hemodialysis.   carbamide peroxide 6.5 % OTIC solution Commonly known as: DEBROX Place 5 drops into both ears 2 (two) times daily as needed (earwax).   clopidogrel 75 MG tablet Commonly known as: PLAVIX Take 1 tablet (75 mg total) by mouth daily with breakfast.   cyanocobalamin 1000 MCG tablet Commonly known as: VITAMIN B12 Take 1,000 mcg by mouth daily.   FREESTYLE TEST STRIPS test strip Generic drug: glucose blood Use to check blood sugar daily   gabapentin 300 MG capsule Commonly known as: NEURONTIN Take 1 capsule (300 mg total) by mouth 2 (two) times daily.   glucose monitoring kit monitoring kit USE TO MONITOR BLOOD GLUCOSE AS DIRECTED   LUBRICATING EYE DROPS OP Place 1 drop into both eyes 3 (three) times daily as needed (dry eyes).   midodrine 10 MG tablet Commonly known as: PROAMATINE Take 1 tablet (10 mg) three  times a week. Add 5 mg a night after dialysis as needed for low blood pressure. What changed:  how much to take how to take this when to take this   MIRCERA IJ as directed.   multivitamin Tabs tablet Take 1 tablet by mouth daily.   nitroGLYCERIN 0.4 MG SL tablet Commonly known as: NITROSTAT Place 1 tablet (0.4 mg total) under the tongue every 5 (five) minutes x 3 doses as needed for chest pain.    oxymetazoline 0.05 % nasal spray Commonly known as: AFRIN Place 1 spray into both nostrils 2 (two) times daily as needed for congestion.   pantoprazole 40 MG tablet Commonly known as: PROTONIX Take 1 tablet by mouth daily.   saccharomyces boulardii 250 MG capsule Commonly known as: FLORASTOR Take 1 capsule (250 mg total) by mouth 2 (two) times daily.   sevelamer carbonate 800 MG tablet Commonly known as: RENVELA Take 2 tablets (1,600 mg total) by mouth 3 (three) times daily with meals. What changed:  when to take this additional instructions        Day of Discharge BP 107/74 (BP Location: Left Arm)   Pulse 64   Temp 98.1 F (36.7 C) (Oral)   Resp 17   Ht '5\' 7"'  (1.702 Farley)   Wt 97.4 kg   SpO2 91%   BMI 33.63 kg/Farley   Basic Metabolic Panel: Recent Labs  Lab 02/08/22 0956 02/08/22 2025 02/09/22 0410 02/09/22 0937 02/09/22 0939 02/10/22 0433  NA 140  --  140 136 137 135  K 5.2*  --  5.3* 5.6* 5.5* 4.6  CL 93*  --  94*  --   --  95*  CO2 28  --  29  --   --  31  GLUCOSE 179*  --  170*  --   --  93  BUN 31*  --  38*  --   --  20  CREATININE 7.49*  --  8.91*  --   --  5.58*  CALCIUM 10.5*  --  9.3  --   --  9.1  PHOS  --  6.7*  --   --   --   --     CBC: Recent Labs  Lab 02/08/22 0956 02/09/22 0410 02/09/22 0937 02/09/22 0939 02/10/22 0433  WBC 9.9 7.6  --   --  8.1  HGB 11.3* 9.7* 10.5* 10.5* 10.0*  HCT 33.4* 29.7* 31.0* 31.0* 30.2*  MCV 105.0* 106.8*  --   --  105.2*  PLT 166 150  --   --  162    Time spent in discharge (includes decision making & examination of pt): < 30 minutes  02/10/2022, 10:57 AM   Cherene Altes, MD [facility] Office  [phone]

## 2022-02-10 NOTE — Evaluation (Signed)
Physical Therapy Evaluation Patient Details Name: Rodney Farley MRN: [id number] DOB: Nov 12, 1931 Today's Date: 02/10/2022  History of Present Illness  Pt presented to ED on 7/30 with chest pain. Pt with NSTEMI due to 90% in-stent restenosis. Cardiac cath with lithotripsy to reduce restenosis to 20%.  PMH: ESRD on HD, CAD s/p PCA with stent placements, NSTEMI, complete heart block s/p pacemaker, HTN, T2DM, OSA, COPD, TIA  Clinical Impression  Pt doing well with mobility and no further PT needed.  Ready for dc from PT standpoint.        Recommendations for follow up therapy are one component of a multi-disciplinary discharge planning process, led by the attending physician.  Recommendations may be updated based on patient status, additional functional criteria and insurance authorization.  Follow Up Recommendations No PT follow up      Assistance Recommended at Discharge PRN  Patient can return home with the following       Equipment Recommendations None recommended by PT  Recommendations for Other Services       Functional Status Assessment Patient has not had a recent decline in their functional status     Precautions / Restrictions Precautions Precautions: Fall      Mobility  Bed Mobility               General bed mobility comments: sitting EOB on arrival    Transfers Overall transfer level: Modified independent Equipment used: None, Rolling walker (2 wheels) Transfers: Sit to/from Stand Sit to Stand: Modified independent (Device/Increase time)                Ambulation/Gait Ambulation/Gait assistance: Modified independent (Device/Increase time) Gait Distance (Feet): 350 Feet Assistive device: Rolling walker (2 wheels) Gait Pattern/deviations: Step-through pattern, Decreased stride length Gait velocity: adequate Gait velocity interpretation: >2.62 ft/sec, indicative of community ambulatory   General Gait Details: Steady gait with  walker  Stairs            Wheelchair Mobility    Modified Rankin (Stroke Patients Only)       Balance Overall balance assessment: Needs assistance, History of Falls Sitting-balance support: No upper extremity supported, Feet supported Sitting balance-Leahy Scale: Good     Standing balance support: No upper extremity supported Standing balance-Leahy Scale: Fair                               Pertinent Vitals/Pain Pain Assessment Pain Assessment: No/denies pain    Home Living Family/patient expects to be discharged to:: Private residence Living Arrangements: Alone Available Help at Discharge: Family;Available PRN/intermittently Type of Home: Apartment Home Access: Level entry       Home Layout: One level Home Equipment: Conservation officer, nature (2 wheels);Rollator (4 wheels);Shower seat;Grab bars - tub/shower      Prior Function Prior Level of Function : Independent/Modified Independent;History of Falls (last six months)             Mobility Comments: Uses rolling walker or rollator when out. Mostly furniture walker inside       Hand Dominance   Dominant Hand: Right    Extremity/Trunk Assessment   Upper Extremity Assessment Upper Extremity Assessment: Overall WFL for tasks assessed    Lower Extremity Assessment Lower Extremity Assessment: Overall WFL for tasks assessed       Communication   Communication: HOH (has hearing aids)  Cognition Arousal/Alertness: Awake/alert Behavior During Therapy: WFL for tasks assessed/performed Overall Cognitive Status: Within Functional Limits  for tasks assessed                                          General Comments      Exercises     Assessment/Plan    PT Assessment Patient does not need any further PT services  PT Problem List         PT Treatment Interventions      PT Goals (Current goals can be found in the Care Plan section)  Acute Rehab PT Goals PT Goal Formulation:  All assessment and education complete, DC therapy    Frequency       Co-evaluation               AM-PAC PT "6 Clicks" Mobility  Outcome Measure Help needed turning from your back to your side while in a flat bed without using bedrails?: None Help needed moving from lying on your back to sitting on the side of a flat bed without using bedrails?: None Help needed moving to and from a bed to a chair (including a wheelchair)?: None Help needed standing up from a chair using your arms (e.g., wheelchair or bedside chair)?: None Help needed to walk in hospital room?: None Help needed climbing 3-5 steps with a railing? : A Little 6 Click Score: 23    End of Session   Activity Tolerance: Patient tolerated treatment well Patient left: in bed;with call bell/phone within reach;with family/visitor present Nurse Communication: Mobility status PT Visit Diagnosis: History of falling (Z91.81)    Time: 4496-7591 PT Time Calculation (min) (ACUTE ONLY): 12 min   Charges:   PT Evaluation $PT Eval Low Complexity: 1 Low          [facility] [place] 02/10/2022, 11:31 AM

## 2022-02-10 NOTE — Progress Notes (Addendum)
Progress Note  Patient Name: Rodney Farley Date of Encounter: 02/10/2022  [facility] Cardiologist: Karas [name], MD   Subjective   No complaints.   Inpatient Medications    Scheduled Meds:  aspirin  81 mg Oral Daily   atorvastatin  80 mg Oral QHS   calcitRIOL  1.75 mcg Oral Q M,W,F-HD   Chlorhexidine Gluconate Cloth  6 each Topical Q0600   clopidogrel  75 mg Oral Q breakfast   gabapentin  300 mg Oral BID   heparin  5,000 Units Subcutaneous Q8H   insulin aspart  0-6 Units Subcutaneous TID WC   midodrine  10 mg Oral Q M,W,F   multivitamin  1 tablet Oral Daily   pantoprazole  40 mg Oral Daily   sevelamer carbonate  1,600 mg Oral TID WC   sevelamer carbonate  800 mg Oral With snacks   sodium chloride flush  3 mL Intravenous Q12H   Continuous Infusions:  PRN Meds: acetaminophen **OR** acetaminophen, albuterol, calcium carbonate (dosed in mg elemental calcium), camphor-menthol **AND** hydrOXYzine, carboxymethylcellul-glycerin, docusate sodium, feeding supplement (NEPRO CARB STEADY), hydrALAZINE, midodrine, nitroGLYCERIN, ondansetron **OR** ondansetron (ZOFRAN) IV, sorbitol, zolpidem   Vital Signs    Vitals:   02/09/22 2140 02/09/22 2144 02/09/22 2236 02/10/22 0438  BP: 99/67 114/60 107/70 107/74  Pulse: 80 80 79 64  Resp: (!) 21 (!) 22 20 17   Temp: 97.9 F (36.6 C) 97.9 F (36.6 C) (!) 97.3 F (36.3 C) 98.1 F (36.7 C)  TempSrc: Oral Oral Oral Oral  SpO2: 94% 94% 95% 91%  Weight:  96.8 kg  97.4 kg  Height:        Intake/Output Summary (Last 24 hours) at 02/10/2022 7106 Last data filed at 02/09/2022 2352 Gross per 24 hour  Intake 589.08 ml  Output 1500 ml  Net -910.92 ml      02/10/2022    4:38 AM 02/09/2022    9:44 PM 02/09/2022    5:00 AM  Last 3 Weights  Weight (lbs) 214 lb 11.7 oz 213 lb 6.5 oz 217 lb 2.5 oz  Weight (kg) 97.4 kg 96.8 kg 98.5 kg      Telemetry    A sensed, Vpaced  - Personally Reviewed  ECG    V paced - Personally  Reviewed  Physical Exam   GEN: No acute distress.   Neck: No JVD Cardiac: RRR, soft systolic murmur, no rubs, or gallops.  Respiratory: Clear to auscultation bilaterally. GI: Soft, nontender, non-distended  MS: No edema; No deformity. Right femoral cath site stable.  Neuro:  Nonfocal  Psych: Normal affect   Labs    High Sensitivity Troponin:   Recent Labs  Lab 01/21/22 1200 01/21/22 1803 01/21/22 1957 02/08/22 0956 02/08/22 1247  TROPONINIHS 59* 65* 73* 464* 498*     Chemistry Recent Labs  Lab 02/08/22 0956 02/09/22 0410 02/09/22 0937 02/09/22 0939 02/10/22 0433  NA 140 140 136 137 135  K 5.2* 5.3* 5.6* 5.5* 4.6  CL 93* 94*  --   --  95*  CO2 28 29  --   --  31  GLUCOSE 179* 170*  --   --  93  BUN 31* 38*  --   --  20  CREATININE 7.49* 8.91*  --   --  5.58*  CALCIUM 10.5* 9.3  --   --  9.1  PROT 7.0  --   --   --   --   ALBUMIN 4.4  --   --   --   --  AST 17  --   --   --   --   ALT 25  --   --   --   --   ALKPHOS 81  --   --   --   --   BILITOT 0.4  --   --   --   --   GFRNONAA 6* 5*  --   --  9*  ANIONGAP 19* 17*  --   --  9    Lipids No results for input(s): "CHOL", "TRIG", "HDL", "LABVLDL", "LDLCALC", "CHOLHDL" in the last 168 hours.  Hematology Recent Labs  Lab 02/08/22 [phone] 02/09/22 0410 02/09/22 0937 02/09/22 0939 02/10/22 0433  WBC 9.9 7.6  --   --  8.1  RBC 3.18* 2.78*  --   --  2.87*  HGB 11.3* 9.7* 10.5* 10.5* 10.0*  HCT 33.4* 29.7* 31.0* 31.0* 30.2*  MCV 105.0* 106.8*  --   --  105.2*  MCH 35.5* 34.9*  --   --  34.8*  MCHC 33.8 32.7  --   --  33.1  RDW 14.9 15.0  --   --  15.1  PLT 166 150  --   --  162   Thyroid No results for input(s): "TSH", "FREET4" in the last 168 hours.  BNPNo results for input(s): "BNP", "PROBNP" in the last 168 hours.  DDimer No results for input(s): "DDIMER" in the last 168 hours.   Radiology    CARDIAC CATHETERIZATION  Result Date: 02/09/2022 Conclusions: Significant single-vessel coronary artery  disease with 90% in-stent restenosis (minimal luminal area 2.9 mm) at site of stent placement in 11/2021 and repeat angioplasty in 12/2021. Moderate LCx disease that appears stable from prior catheterizations. Stable 70% proximal stenosis of nondominant RCA Patent D2 stent with 30% stenosis at the ostium of D2 before the stent.  Reported OM stent is not clearly visualized. Moderately to severely elevated left and right heart filling pressures (PCWP 33 mmHg, mean RA 14 mmHg). Normal to supranormal cardiac output, likely accentuated by dialysis fistula. Successful PTCA with intracoronary lithotripsy of the ostial/proximal LAD in-stent restenosis with reduction in stenosis from 90% to 20% and improvement in flow from TIMI-2 to TIMI-3.  Additional stent placement at the ostium of the LAD was not pursued given inability to completely expand the vessel despite lithotripsy and high-pressure angioplasty. Recommendations: Remove right femoral artery and venous sheaths after ACT has fallen below 175 seconds with manual compression. Continue indefinite dual antiplatelet therapy with aspirin and clopidogrel. Aggressive secondary prevention of coronary artery disease. Proceed with hemodialysis later today, if possible, given elevated filling pressures. Limited echo to reassess LVEF and aortic stenosis. Nelva Bush, MD [facility]  PERIPHERAL VASCULAR CATHETERIZATION  Result Date: 02/09/2022 Conclusions: Significant single-vessel coronary artery disease with 90% in-stent restenosis (minimal luminal area 2.9 mm) at site of stent placement in 11/2021 and repeat angioplasty in 12/2021. Moderate LCx disease that appears stable from prior catheterizations. Stable 70% proximal stenosis of nondominant RCA Patent D2 stent with 30% stenosis at the ostium of D2 before the stent.  Reported OM stent is not clearly visualized. Moderately to severely elevated left and right heart filling pressures (PCWP 33 mmHg, mean RA 14 mmHg).  Normal to supranormal cardiac output, likely accentuated by dialysis fistula. Successful PTCA with intracoronary lithotripsy of the ostial/proximal LAD in-stent restenosis with reduction in stenosis from 90% to 20% and improvement in flow from TIMI-2 to TIMI-3.  Additional stent placement at the ostium of the LAD was not  pursued given inability to completely expand the vessel despite lithotripsy and high-pressure angioplasty. Recommendations: Remove right femoral artery and venous sheaths after ACT has fallen below 175 seconds with manual compression. Continue indefinite dual antiplatelet therapy with aspirin and clopidogrel. Aggressive secondary prevention of coronary artery disease. Proceed with hemodialysis later today, if possible, given elevated filling pressures. Limited echo to reassess LVEF and aortic stenosis. Nelva Bush, MD [facility]  DG Chest Port 1 View  Result Date: 02/08/2022 CLINICAL DATA:  A male at age 88 presents for evaluation of chest pain that began last night. EXAM: PORTABLE CHEST 1 VIEW COMPARISON:  January 21, 2022. FINDINGS: Multi lead pacer defibrillator remains in place, power pack over LEFT chest. EKG leads project over the chest. Cardiomediastinal contours again with cardiac enlargement not changed from previous imaging. Central pulmonary vascular engorgement without frank edema. No consolidation. No visible pneumothorax. On limited assessment no acute skeletal findings. IMPRESSION: No active disease. Electronically Signed   By: Zetta Bills M.D.   On: 02/08/2022 10:43    Cardiac Studies   Cath: 02/09/22  Conclusions: Significant single-vessel coronary artery disease with 90% in-stent restenosis (minimal luminal area 2.9 mm) at site of stent placement in 11/2021 and repeat angioplasty in 12/2021. Moderate LCx disease that appears stable from prior catheterizations. Stable 70% proximal stenosis of nondominant RCA Patent D2 stent with 30% stenosis at the ostium of D2  before the stent.  Reported OM stent is not clearly visualized. Moderately to severely elevated left and right heart filling pressures (PCWP 33 mmHg, mean RA 14 mmHg). Normal to supranormal cardiac output, likely accentuated by dialysis fistula. Successful PTCA with intracoronary lithotripsy of the ostial/proximal LAD in-stent restenosis with reduction in stenosis from 90% to 20% and improvement in flow from TIMI-2 to TIMI-3.  Additional stent placement at the ostium of the LAD was not pursued given inability to completely expand the vessel despite lithotripsy and high-pressure angioplasty.   Recommendations: Remove right femoral artery and venous sheaths after ACT has fallen below 175 seconds with manual compression. Continue indefinite dual antiplatelet therapy with aspirin and clopidogrel. Aggressive secondary prevention of coronary artery disease. Proceed with hemodialysis later today, if possible, given elevated filling pressures. Limited echo to reassess LVEF and aortic stenosis.   Nelva Bush, MD [facility]  Diagnostic Dominance: Right  Intervention    Patient Profile     88 y.o. male with a hx of chronic systolic heart failure, diabetes with neuropathy, coronary artery disease Status post non-STEMI who was seen 02/08/2022 for the evaluation of non-STEMI at the request of Isla Pence.  Assessment & Plan    NSTEMI CAD s/p prior stenting to LAD (6/23) and OM 3 -- High-sensitivity troponin 464>> 498.  Underwent cardiac catheterization noted above with significant single-vessel CAD with 90% in-stent restenosis of stent placement from 11/2021 and repeat angioplasty in 12/2021.  Accessible PTCA with lithotripsy of ostial/proximal LAD in-stent restenosis with reduction to 20%.  Additional stent placement at the ostium of the LAD not done given inability to completely expand vessel despite lithotripsy and high-pressure angioplasty.  Recommendations for indefinite DAPT with  aspirin/Plavix. -- Continue aspirin, Plavix, atorvastatin 80mg  daily    ESRD on HD: Followed by nephrology, on Monday Wednesday Friday schedule. -- Per nephrology   Hypotension: Unable to tolerate any blood pressure medications in the past.   -- Takes midodrine on HD days   Hyperlipidemia: LDL 79, HDL 41 -- Continue atorvastatin 80 mg daily   HFrEF Ischemic cardiomyopathy S/p CRT-D --  Most recent echo with LVEF of 45 to 50%.  Volume management per HD. -- AV paced on telemetry   Anemia: baseline Hgb around 11, dipped to 9.7, but improved to 10 this morning. No reported bleeding issues overnight.     [facility] will sign off.   Medication Recommendations:  noted above Other recommendations (labs, testing, etc):  n/a Follow up as an outpatient:  will arrange outpatient TOC appt  For questions or updates, please contact [facility] Please consult [email or web address] for contact info under        Signed, Reino Bellis, NP  02/10/2022, 8:22 AM    I have personally seen and examined this patient. I agree with the assessment and plan as outlined above.  Doing well post PCI. Continue ASA and Plavix. We will sign off. Follow up has been arrange. Please call with questions.   Lauree Chandler 02/10/2022 9:31 AM

## 2022-02-10 NOTE — Progress Notes (Signed)
CARDIAC REHAB PHASE I   PRE:  Rate/Rhythm: 85 paced   BP:  Sitting: 110/51      SaO2: 95 RA  MODE:  Ambulation: 450 ft   POST:  Rate/Rhythm: 88 paced   BP:  Sitting: 121/94      SaO2: 97 RA  Ambulated in hall way using front wheel walker standby assist only. Tolerated well with no CP or SOB. He feels great his morning and is eager to go home. Home education including site care, risk factors, exercise guidelines, MI booklet, restrictions, heart healthy diabetic diet, and CRP2. Referral sent to [facility] for CRP2. Pt is looking forward to CRP2. No questions or concerns at this time.   0900-1000    Vanessa Barbara, RN BSN 02/10/2022 9:55 AM

## 2022-02-11 ENCOUNTER — Telehealth: Payer: Self-pay | Admitting: Nephrology

## 2022-02-11 ENCOUNTER — Other Ambulatory Visit (HOSPITAL_COMMUNITY): Payer: Self-pay

## 2022-02-11 NOTE — Telephone Encounter (Signed)
Transition of Care Contact from [place]  Date of Discharge: 02/10/22 Date of Contact: 02/11/22 Method of contact: phone - attempted  Attempted to contact patient to discuss transition of care from inpatient admission.  Patient did not answer the phone.  Will attempt to call again and if unable to reach will follow up at dialysis.  Jen Mow, PA-C [facility] Pager: [phone]

## 2022-02-13 ENCOUNTER — Other Ambulatory Visit (HOSPITAL_COMMUNITY): Payer: Self-pay

## 2022-02-13 LAB — HEPATITIS B SURFACE ANTIBODY, QUANTITATIVE: Hep B S AB Quant (Post): 13.1 m[IU]/mL (ref >9.9)

## 2022-02-15 NOTE — Progress Notes (Signed)
Cardiology Office Note:    Date:  02/15/2022   ID:  [name], [name] 01-Nov-1931, MRN [id number]  PCP:  [name], MD  Cardiologist:  Rockie [name], MD  Electrophysiologist:  None   Referring MD: [name], MD   Chief Complaint: hospital follow-up of NSTEMI  History of Present Illness:    Rodney Farley is a 88 y.o. male with a history of CAD with remote MI in 89 while in [place], PCIs to LAD/ 2nd Diag/ OM3 in 2016, and recent NSTEMI on 11/11/2021 s/p DES to ostial to LAD; ischemic cardiomyopathy/ chronic systolic CHF with EF as low as 25-30% in 2016 with subsequent normalization and then EF of 45-50% on most recent Echo on 11/11/2021; complete heart block s/p PPM which was upgraded to ICD-CRT in 2017 (gen change in 09/2021); subclinical atrial fibrillation noted on device in 2021 not on anticoagulation, moderate aortic stenosis, hypertension; hyperlipidemia; type 2 diabetes mellitus; ESRD on hemodialysis; obstructive sleep apnea; COPD; TIA; and BPH who is followed by Dr. [name] and Dr. Caryl Comes and presents today for hospital follow-up of NSTEMI.  Patient was recently admitted from 11/11/2021 to 11/13/2021 for NSTEMI after presenting with sharp substernal chest pain radiating to the left upper extremity with associated nausea, diaphoresis, and dyspnea. High-sensitivity troponin peaked at 4,677. Echo showed LVEF of 45-50% with akinesis of the apical septal segment, apical inferior segment, and apex as well as grade 1 diastolic dysfunction and moderate aortic stenosis. LHC on 11/12/2021 90% stenosis of ostial to proximal LAD, 90% stenosis of ostial 1st Diag, and 70% stenosis of ostial to proximal RCA, and then mild disease in OM3, 2nd Diag, and LPAV. He underwent successful PCI with DES to ostial to proximal LAD lesion with a 3.0 x 24 mm Synergy stent. This was post dilated to 3.5 mm. This was proximal to the old stent.He was started on DAPT with Aspirin and Plavix and home Lipitor  was increased.  He was readmitted from 12/23/2021 to 12/25/2021 for NSTEMI after presenting with chest pain that radiated to bilateral arms with associated nausea and vomiting. Pain improved with Nitro. High-sensitivity troponin 223 >> 653 >> 1,073. Repeat LHC which showed 90% in-stent restenosis of distal left main to ostial LAD with thrombus and  stent underexpansion at the ostium with OCT. Patient underwent difficult but successful PCI with PCTA of this lesion. Dilated to high pressures with a 4.0 mm [place] balloon.  He was continued on DAPT but Coreg was stopped due to low BP.   The patient notes that the following day he continued to have angina. It is occurring daily. It is relieved typically after 20 minutes with one sl Ntg. He doesn't really have pain when at dialysis. Typically awakens him from sleep. He still has issues with low BP and has to take midodrine on dialysis days.  To evaluate the contribution of his aortic stenosis he had a calcium score of the aortic valve with a score of 1304. This was not consistent with severe AS.   He was readmitted with NSTEMI on 02/08/22. Peak troponin 498. Repeat cardiac cath showed restenosis of the proximal LAD stent. Intravascular imaging again showed the stent was not fully expanded due to calcification. Intracoronary lithotripsy was performed with aggressive dilation with a 4.0 [place] balloon to 22 atm. Additional stenting was not done due to inability to expand fully. His other disease was not changed significantly.   He is seen today with his daughter in law. He is  feeling much better with improvement in angina. He is walking in his apartment corridor for 5 minutes at a time. Dyspnea is improved. No cath site complications.    Past Medical History:  Diagnosis Date   AICD (automatic cardioverter/defibrillator) present    Medtronic pacer   Anemia    Anxiety and depression    Arthritis    Bell's palsy    left side. after shingles episode   BPH associated  with nocturia    Chronic systolic CHF (congestive heart failure) ([facility])    EF normalized by Echo 2019   COPD (chronic obstructive pulmonary disease) ([facility])    Severe   Coronary artery disease    a. s/p MI in 1994/1995 while in [place] s/p questionable PCI. 03/2015: progression of disease, for staged PCI.   Diabetic peripheral neuropathy (HCC)    GERD (gastroesophageal reflux disease)    Gout    Hard of hearing    B/L   History of chronic pancreatitis 07/23/2017   noted on CT abd/pelvis   History of shingles    Hypercholesterolemia    Hypertension    Obesity    Sleep apnea    "sleeps w/humidifyer when he panics and gets short of breath" (04/08/2015)   TIA (transient ischemic attack) X 3   Trigger middle finger of left hand    Type II diabetes mellitus ([facility])    Wears glasses    Wears hearing aid     Past Surgical History:  Procedure Laterality Date   APPENDECTOMY     AV FISTULA PLACEMENT Right 02/07/2019   Procedure: ARTERIOVENOUS (AV) FISTULA CREATION RIGHT UPPER ARM;  Surgeon: Waynetta Sandy, MD;  Location: [facility];  Service: Vascular;  Laterality: Right;   BIV ICD GENERATOR CHANGEOUT N/A 10/09/2021   Procedure: BIV ICD GENERATOR CHANGEOUT;  Surgeon: Deboraha Sprang, MD;  Location: [facility];  Service: Cardiovascular;  Laterality: N/A;   CARDIAC CATHETERIZATION N/A 03/29/2015   Procedure: Right/Left Heart Cath and Coronary Angiography;  Surgeon: Ricky M [name], MD;  Location: [facility];  Service: Cardiovascular;  Laterality: N/A;   [place]   "after my MI; put me on heart RX after cath"   CARDIAC CATHETERIZATION N/A 04/09/2015   Procedure: Coronary Stent Intervention;  Surgeon: Coulton M [name], MD;  Location: [facility];  Service: Cardiovascular;  Laterality: N/A;   CATARACT EXTRACTION W/ INTRAOCULAR LENS  IMPLANT, BILATERAL     CHOLECYSTECTOMY N/A 10/13/2017   Procedure: LAPAROSCOPIC CHOLECYSTECTOMY WITH LYSIS OF ADHESIONS;   Surgeon: Ileana Roup, MD;  Location: [facility];  Service: General;  Laterality: N/A;   COLONOSCOPY     CORONARY ANGIOGRAPHY N/A 11/12/2021   Procedure: CORONARY ANGIOGRAPHY;  Surgeon: Wellington Hampshire, MD;  Location: [facility];  Service: Cardiovascular;  Laterality: N/A;   CORONARY ANGIOGRAPHY N/A 12/24/2021   Procedure: CORONARY ANGIOGRAPHY;  Surgeon: Troy Sine, MD;  Location: [facility];  Service: Cardiovascular;  Laterality: N/A;   CORONARY BALLOON ANGIOPLASTY N/A 12/24/2021   Procedure: CORONARY BALLOON ANGIOPLASTY;  Surgeon: Troy Sine, MD;  Location: [facility];  Service: Cardiovascular;  Laterality: N/A;   CORONARY BALLOON ANGIOPLASTY N/A 02/09/2022   Procedure: CORONARY BALLOON ANGIOPLASTY;  Surgeon: Nelva Bush, MD;  Location: [facility];  Service: Cardiovascular;  Laterality: N/A;  LAD   CORONARY STENT INTERVENTION N/A 11/12/2021   Procedure: CORONARY STENT INTERVENTION;  Surgeon: Wellington Hampshire, MD;  Location:  [facility];  Service: Cardiovascular;  Laterality: N/A;  LAD   DENTAL SURGERY     EP IMPLANTABLE DEVICE N/A 09/23/2015   MDT CRT-D, Dr. Caryl Comes   HIATAL HERNIA REPAIR  1977   ILEOCECETOMY N/A 03/27/2017   Procedure: ILEOCECECTOMY;  Surgeon: Ileana Roup, MD;  Location: [facility];  Service: General;  Laterality: N/A;   INSERT / REPLACE / REMOVE PACEMAKER  07/2008   Complete heart block status post DDD with good function   INTRAVASCULAR IMAGING/OCT N/A 12/24/2021   Procedure: INTRAVASCULAR IMAGING/OCT;  Surgeon: Troy Sine, MD;  Location: [facility];  Service: Cardiovascular;  Laterality: N/A;   INTRAVASCULAR LITHOTRIPSY  02/09/2022   Procedure: INTRAVASCULAR LITHOTRIPSY;  Surgeon: Nelva Bush, MD;  Location: [facility];  Service: Cardiovascular;;  LAD   INTRAVASCULAR ULTRASOUND/IVUS N/A 02/09/2022   Procedure: Intravascular Ultrasound/IVUS;  Surgeon: Nelva Bush, MD;  Location: [facility];  Service: Cardiovascular;  Laterality: N/A;  LAD   IR FLUORO GUIDE CV LINE RIGHT  04/03/2019   IR US GUIDE VASC ACCESS RIGHT  04/03/2019   LAPAROTOMY N/A 03/27/2017   Procedure: EXPLORATORY LAPAROTOMY;  Surgeon: Ileana Roup, MD;  Location: [facility];  Service: General;  Laterality: N/A;   RIGHT HEART CATH AND CORONARY ANGIOGRAPHY N/A 02/09/2022   Procedure: RIGHT HEART CATH AND CORONARY ANGIOGRAPHY;  Surgeon: Nelva Bush, MD;  Location: [facility];  Service: Cardiovascular;  Laterality: N/A;   TONSILLECTOMY     UPPER GI ENDOSCOPY      Current Medications: No outpatient medications have been marked as taking for the 02/17/22 encounter (Appointment) with [name], Croy M, MD.     Allergies:   Bee venom, Lyrica [pregabalin], Prednisone, and Zocor [simvastatin]   Social History   Socioeconomic History   Marital status: Legally Separated    Spouse name: Not on file   Number of children: 1   Years of education: college   Highest education level: Not on file  Occupational History   Occupation: Retired  Tobacco Use   Smoking status: Former    Packs/day: 1.50    Years: 54.00    Total pack years: 81.00    Types: Cigarettes    Quit date: 07/18/2007    Years since quitting: 14.5    Passive exposure: Never   Smokeless tobacco: Never  Vaping Use   Vaping Use: Never used  Substance and Sexual Activity   Alcohol use: Yes    Comment: rare   Drug use: No   Sexual activity: Never  Other Topics Concern   Not on file  Social History Narrative   Married 1994 (together since 1989) 1 son, 1 stepson. 3 grandkids, 6 great grandkids      Retired from [employer]. Had 2 years of collge.       Faith: Mormon      Here on [address] since 2004 from [place]; wife recently left and relocated to [facility]    Social Determinants of Health   Financial Resource Strain: Not on [employer] Insecurity: Not on file  Transportation Needs: Not on file  Physical Activity: Not  on file  Stress: Not on file  Social Connections: Not on file     Family History: [name] family history includes Heart attack in his brother; Leukemia in his father; Stroke in his mother and sister.  ROS:   Please see the history of present illness.     EKGs/Labs/Other Studies  Reviewed:    The following studies were reviewed today:  Echocardiogram 11/11/2021: Impressions:  1. Left ventricular ejection fraction, by estimation, is 45 to 50%. The  left ventricle has mildly decreased function. The left ventricle  demonstrates regional wall motion abnormalities (see scoring  diagram/findings for description). The left ventricular   internal cavity size was mildly dilated. There is mild left ventricular  hypertrophy. Left ventricular diastolic parameters are consistent with  Grade I diastolic dysfunction (impaired relaxation).   2. Right ventricular systolic function is normal. The right ventricular  size is normal. Tricuspid regurgitation signal is inadequate for assessing  PA pressure.   3. Left atrial size was moderately dilated.   4. The mitral valve is degenerative. Trivial mitral valve regurgitation.  No evidence of mitral stenosis.   5. The aortic valve was not well visualized. Aortic valve regurgitation  is not visualized. Moderate aortic valve stenosis. Vmax 2.9 m/s, MG  67mmHg, AVA 1.2 cm^2, DI 0.38. _______________   Coronary Stent Intervention 11/12/2021:   Ost 1st Diag to 1st Diag lesion is 90% stenosed.   Ost RCA to Prox RCA lesion is 70% stenosed.   3rd Mrg lesion is 10% stenosed.   2nd Diag-2 lesion is 10% stenosed.   Mid LAD lesion is 20% stenosed.   1st Diag lesion is 30% stenosed.   2nd Diag-1 lesion is 40% stenosed.   LPAV lesion is 40% stenosed.   Ost LAD to Prox LAD lesion is 90% stenosed.   A drug-eluting stent was successfully placed using a SYNERGY XD 3.0X24.   Post intervention, there is a 0% residual stenosis.   1.  Patent LAD, diagonal and OM  stent with no significant restenosis.  Severe 90% stenosis in ostial/proximal LAD is a likely culprit for myocardial infarction.  70% stenosis in ostial right coronary artery but the vessel is nondominant. 2.  Successful angioplasty and drug-eluting stent placement to the ostial/proximal LAD. 3.  I could not cross the aortic valve due to significant iliac and aortic tortuosity and I elected not to use a straight wire given that the patient just had an echocardiogram showing moderate aortic stenosis.   Recommendations: Dual antiplatelet therapy for at least 12 months. Aggressive treatment of risk factors.   Diagnostic Dominance: Left Intervention     _______________  Left Cardiac Catheterization 12/24/2021:   Dist LM to Ost LAD lesion is 90% stenosed.   Prox Cx lesion is 30% stenosed.   Mid Cx to Dist Cx lesion is 40% stenosed.   3rd Mrg lesion is 50% stenosed.   Mid Cx lesion is 30% stenosed.   Ost RCA to Prox RCA lesion is 70% stenosed.   Previously placed 2nd Diag stent of unknown type is  widely patent.   Post intervention, there is a 0% residual stenosis.   NSTEMI secondary to focal 90% proximal in-stent restenosis with thrombus and probable stent underexpansion.   Mild residual nonobstructive CAD in the left circumflex vessel with 30, 40 and 50% stenoses.  A previously placed stent in the OM3 vessel was difficult to visualize.   No significant change in the previous 70% proximal RCA stenosis in nondominant RCA vessel.   OCT demonstrating prior LAD stent underexpansion ostially with calcification and plaque.   Difficult but successful PCI to the ostium proximal LAD stent treated with PTCA with a 2.5 x 12 mm, 3.0 x 12 mm compliant balloon, 3.5 and 4.0 x 15 mm noncompliant balloons.   Recommendation: DAPT indefinitely.  Medical therapy for  concomitant CAD.  The patient is on dialysis on Monday, Wednesday, and Fridays.  Aggressive lipid-lowering therapy with target LDL less than  55.  Diagnostic Dominance: Right  Intervention    CLINICAL DATA:  Cardiovascular Disease Risk stratification   EXAM: Coronary Calcium Score   TECHNIQUE: A gated, non-contrast computed tomography scan of the heart was performed using 33m slice thickness. Axial images were analyzed on a dedicated workstation. Calcium scoring of the coronary arteries was performed using the Agatston method.   MEDICATIONS: MEDICATIONS No medications.   FINDINGS: Coronary arteries: Normal origins.  Stents noted.   Unable to measure coronary calcium score with stents present and BiV pacemaker present.   Aortic valve calcium score 1304 Agatston units.   Pericardium: Normal.   Ascending Aorta: Normal caliber.   There is a biventricular pacemaker present.   [facility]cardiac: [facility]   IMPRESSION: Aortic valve calcium score 1304 Agatston units. For a male, this is not suggestive of severe AS (>2000 Agatston units).   RECOMMENDATIONS: Coronary artery calcium (CAC) score is a strong predictor of incident coronary heart disease (CHD) and provides predictive information beyond traditional risk factors. CAC scoring is reasonable to use in the decision to withhold, postpone, or initiate statin therapy in intermediate-risk or selected borderline-risk asymptomatic adults (age 86-75years and LDL-C >=70 to <190 mg/dL) who do not have diabetes or established atherosclerotic cardiovascular disease (ASCVD).* In intermediate-risk (10-year ASCVD risk >=7.5% to <20%) adults or selected borderline-risk (10-year ASCVD risk >=5% to <7.5%) adults in whom a CAC score is measured for the purpose of making a treatment decision the following recommendations have been made:   If CAC=0, it is reasonable to withhold statin therapy and reassess in 5 to 10 years, as long as higher risk conditions are absent (diabetes mellitus, family history of premature CHD in first  degree relatives (males <55 years; females <65 years), cigarette smoking, or LDL >=190 mg/dL).   If CAC is 1 to 99, it is reasonable to initiate statin therapy for patients >=523years of age.   If CAC is >=100 or >=75th percentile, it is reasonable to initiate statin therapy at any age.   Cardiology referral should be considered for patients with CAC scores >=400 or >=75th percentile.   *2018 AHA/ACC/AACVPR/AAPA/ABC/ACPM/ADA/AGS/APhA/ASPC/NLA/PCNA Guideline on the Management of Blood Cholesterol: A Report of the American College of Cardiology/American Heart Association Task Force on Clinical Practice Guidelines. J Am Coll Cardiol. 2019;73(24):3168-3209.   DLoralie Champagne MD   Electronically Signed: By: DLoralie ChampagneM.D. On: 02/03/2022 14:44    02/09/22: Procedures  INTRAVASCULAR LITHOTRIPSY  CORONARY BALLOON ANGIOPLASTY  Intravascular Ultrasound/IVUS  RIGHT HEART CATH AND CORONARY ANGIOGRAPHY   Conclusion  Conclusions: Significant single-vessel coronary artery disease with 90% in-stent restenosis (minimal luminal area 2.9 mm) at site of stent placement in 11/2021 and repeat angioplasty in 12/2021. Moderate LCx disease that appears stable from prior catheterizations. Stable 70% proximal stenosis of nondominant RCA Patent D2 stent with 30% stenosis at the ostium of D2 before the stent.  Reported OM stent is not clearly visualized. Moderately to severely elevated left and right heart filling pressures (PCWP 33 mmHg, mean RA 14 mmHg). Normal to supranormal cardiac output, likely accentuated by dialysis fistula. Successful PTCA with intracoronary lithotripsy of the ostial/proximal LAD in-stent restenosis with reduction in stenosis from 90% to 20% and improvement in flow from TIMI-2 to TIMI-3.  Additional stent placement at the ostium of the LAD was not pursued given inability to completely  expand the vessel despite lithotripsy and high-pressure angioplasty.    Recommendations: Remove right femoral artery and venous sheaths after ACT has fallen below 175 seconds with manual compression. Continue indefinite dual antiplatelet therapy with aspirin and clopidogrel. Aggressive secondary prevention of coronary artery disease. Proceed with hemodialysis later today, if possible, given elevated filling pressures. Limited echo to reassess LVEF and aortic stenosis.   Nelva Bush, MD [facility]  Coronary Diagrams  Diagnostic Dominance: Right  Intervention   EKG:  EKG  is not ordered today.   Recent Labs: 11/11/2021: TSH 1.400 01/06/2022: Magnesium 2.3 02/08/2022: ALT 25 02/10/2022: BUN 20; Creatinine, Ser 5.58; Hemoglobin 10.0; Platelets 162; Potassium 4.6; Sodium 135  Recent Lipid Panel    Component Value Date/Time   CHOL 153 01/06/2022 1228   TRIG 197 (H) 01/06/2022 1228   HDL 41 01/06/2022 1228   CHOLHDL 3.7 01/06/2022 1228   CHOLHDL 4.2 11/12/2021 0554   VLDL 47 (H) 11/12/2021 0554   LDLCALC 79 01/06/2022 1228   LDLDIRECT 60.0 04/13/2018 0926    Physical Exam:    Vital Signs: There were no vitals taken for this visit.    Wt Readings from Last 3 Encounters:  02/10/22 214 lb 11.7 oz (97.4 kg)  01/26/22 212 lb 6.4 oz (96.3 kg)  01/21/22 216 lb 14.9 oz (98.4 kg)     General: 88 y.o. Caucasian male, elderly,  in no acute distress. HEENT: Normocephalic and atraumatic. Sclera clear.  Neck: Supple. No JVD. Heart: RRR. III/VI systolic murmur best heard at upper sternal border.  Lungs: No increased work of breathing. Clear to ausculation bilaterally. No wheezes, rhonchi, or rales.  Abdomen: Soft, non-distended, and non-tender to palpation.  Extremities: No lower extremity edema.    Skin: Warm and dry. Neuro: Alert and oriented x3. No focal deficits. Psych: Normal affect. Responds appropriately.    Assessment/Plan:   CAD s/p multiple stents in 2016. Presented in May with severe stenosis ostial-proximal LAD treated with DES. Had  early restenosis with ?thrombus in June related to stent underexpansion/calcification. Treated with high pressure [place] balloon to 4.0 mm with OCT guidance. Readmitted July 30 with NSTEMI. Restenosis of the proximal LAD stent treated with lithotripsy and repeat high pressure balloon inflation. He is clinically improved. Unfortunately has early restenosis x 2 related to stent under expansion from heavy calcification. He is s/p lithotripsy on most recent procedure and hopefull this will help but I explained that he is at high risk for recurrence and additional therapies are very limited. He is not a surgical candidate and performing balloon angioplasty every month is not really a solution.  - Continue DAPT with Aspirin and Plavix. - Continue high-intensity statin. - no good options for antianginal therapy given hypotension with dialysis. Not a candidate for Ranexa with ESRD.  - encouraged him to liberally use sl Ntg as needed for chest pain.     2. Ischemic Cardiomyopathy Chronic Systolic CHF Echo in 0/6237 showed LVEF 45-50% with akinesis of the apical septal segment, apical inferior segment, and apex as well as grade 1 diastolic dysfunction. - Euvolemic on exam.  - Volume status is being managed by hemodialysis.  - Previously on Coreg but this was stopped due to hypotension.  - No ACEi/ARB due to renal function.   3. Moderate Aortic Stenosis Noted on Echo during recent admission. - I personally reviewed Echo.  -by CT calcium score of the AV is not in the severe range.   - patient's AS is not severe enough to  consider for TAVR.    4. Complete Heart Block S/p ICD-CRT with recent gen change in 09/2021. - Followed by Dr. Caryl Comes.   5. Subclinical Atrial Fibrillation History of atrial fibrillation noted on prior device check in 2021. Not on anticoagulation due to low atrial fibrillation burden. No atrial fibrillation noted on device interrogations since 06/2020.    6. Hypotension Previously had  hypertension but now struggles with hypotension  - BP is OK today - on no BP lowering medication. - Takes Midodrine 59m on dialysis days. OK to take additional 590mon non-dialysis days if needed.  7. Hyperlipidemia - Continue Lipitor 8039maily.  - last LDL 79 in June   8. Type 2 Diabetes Mellitus Hemoglobin A1c 7.5 in 11/2021. - Management per PCP.   9. ESRD On hemodialysis on M/W/F. Takes Midodrine on dialysis days. - Management per Nephrology.  Follow up in one month.  Medication Adjustments/Labs and Tests Ordered: Current medicines are reviewed at length with the patient today.  Concerns regarding medicines are outlined above.  No orders of the defined types were placed in this encounter.  No orders of the defined types were placed in this encounter.      Signed, Kahli JorMartiniqueD  02/15/2022 1:21 PM    [facility]

## 2022-02-17 ENCOUNTER — Ambulatory Visit (INDEPENDENT_AMBULATORY_CARE_PROVIDER_SITE_OTHER): Payer: PPO | Admitting: Cardiology

## 2022-02-17 ENCOUNTER — Encounter: Payer: Self-pay | Admitting: Cardiology

## 2022-02-17 VITALS — BP 118/56 | HR 80 | Ht 67.0 in | Wt 214.0 lb

## 2022-02-17 DIAGNOSIS — I350 Nonrheumatic aortic (valve) stenosis: Secondary | ICD-10-CM | POA: Diagnosis not present

## 2022-02-17 DIAGNOSIS — I442 Atrioventricular block, complete: Secondary | ICD-10-CM | POA: Diagnosis not present

## 2022-02-17 DIAGNOSIS — I25118 Atherosclerotic heart disease of native coronary artery with other forms of angina pectoris: Secondary | ICD-10-CM | POA: Diagnosis not present

## 2022-02-17 DIAGNOSIS — I5032 Chronic diastolic (congestive) heart failure: Secondary | ICD-10-CM | POA: Diagnosis not present

## 2022-02-17 DIAGNOSIS — N186 End stage renal disease: Secondary | ICD-10-CM

## 2022-02-18 ENCOUNTER — Other Ambulatory Visit (HOSPITAL_COMMUNITY): Payer: Self-pay

## 2022-02-20 LAB — POCT ACTIVATED CLOTTING TIME: Activated Clotting Time: 221 seconds

## 2022-02-23 ENCOUNTER — Telehealth (HOSPITAL_COMMUNITY): Payer: Self-pay | Admitting: *Deleted

## 2022-02-23 NOTE — Telephone Encounter (Signed)
Spoke to pt daughter in law, [name] who is listed on the DPR for the location of his dialysis center and to confirm the time. Pt participates in dialysis on MWF at 11:20 (must arrive by 11:10) at the [facility] location.  Will send clearance letter to the dialysis care team for clearance to participate with both on the same time and any bp parameters. Will fax to the dialysis care team and place him in the waiting list as we wait for a returned reply.  Verbalized understanding. Cherre Huger, BSN Cardiac and Training and development officer

## 2022-02-24 ENCOUNTER — Encounter: Payer: Self-pay | Admitting: Podiatry

## 2022-02-24 ENCOUNTER — Ambulatory Visit: Payer: PPO | Admitting: Podiatry

## 2022-02-24 DIAGNOSIS — N186 End stage renal disease: Secondary | ICD-10-CM

## 2022-02-24 DIAGNOSIS — E1122 Type 2 diabetes mellitus with diabetic chronic kidney disease: Secondary | ICD-10-CM | POA: Diagnosis not present

## 2022-02-24 DIAGNOSIS — B351 Tinea unguium: Secondary | ICD-10-CM | POA: Diagnosis not present

## 2022-02-24 DIAGNOSIS — E11621 Type 2 diabetes mellitus with foot ulcer: Secondary | ICD-10-CM

## 2022-02-24 DIAGNOSIS — L97412 Non-pressure chronic ulcer of right heel and midfoot with fat layer exposed: Secondary | ICD-10-CM

## 2022-02-25 NOTE — Progress Notes (Signed)
This patient returns to my office for at risk foot care.  This patient requires this care by a professional since this patient will be at risk due to having kidney disease, coagulation defect and diabetic neuropathy.  This patient is unable to cut nails himself since the patient cannot reach his nails.These nails are painful walking and wearing shoes.  Patient also has a callus under his big toe joint right foot which he sees on and off drainage.  Minimal drainage noted today. This patient presents for at risk foot care today.  General Appearance  Alert, conversant and in no acute stress.  Vascular  Dorsalis pedis and posterior tibial  pulses are  weakly palpable  bilaterally.  Capillary return is within normal limits  bilaterally. Temperature is within normal limits  bilaterally.  Neurologic  Senn-Weinstein monofilament wire test absent  bilaterally. Muscle power within normal limits bilaterally.  Nails Thick disfigured discolored nails with subungual debris  from hallux to fifth toes bilaterally. No evidence of bacterial infection or drainage bilaterally.  Orthopedic  No limitations of motion  feet .  No crepitus or effusions noted.  No bony pathology or digital deformities noted.  Skin  normotropic skin with no porokeratosis noted bilaterally.  No signs of infections or ulcers noted.   Callus under 1st MPJ right with 1 cm. Sized lesion.  No drainage or redness or infection noted.   Onychomycosis  Pain in right toes  Pain in left toes  Callus /ulcer right forefoot.  Consent was obtained for treatment procedures.   Mechanical debridement of nails 1-5  bilaterally performed with a nail nipper.  Filed with dremel without incident. Debrided callus with dremel tool and bandaged open wound with neosporin/DSD.  Padding added to diabetic insole.  Scheduled this patient for diabetic insole/shoe in future.  RTC 3 months for nail care.  Told patient to return for periodic foot care and evaluation due to  potential at risk complications.   Gardiner Barefoot DPM

## 2022-02-26 ENCOUNTER — Other Ambulatory Visit: Payer: PPO

## 2022-03-13 ENCOUNTER — Telehealth (HOSPITAL_COMMUNITY): Payer: Self-pay

## 2022-03-16 ENCOUNTER — Other Ambulatory Visit: Payer: Self-pay | Admitting: Cardiology

## 2022-03-16 MED ORDER — GABAPENTIN 300 MG PO CAPS: 300.0000 mg | ORAL_CAPSULE | Freq: Two times a day (BID) | ORAL | 1 refills | Status: DC

## 2022-03-17 ENCOUNTER — Ambulatory Visit (INDEPENDENT_AMBULATORY_CARE_PROVIDER_SITE_OTHER): Payer: PPO | Admitting: *Deleted

## 2022-03-17 DIAGNOSIS — E1122 Type 2 diabetes mellitus with diabetic chronic kidney disease: Secondary | ICD-10-CM

## 2022-03-17 DIAGNOSIS — L97412 Non-pressure chronic ulcer of right heel and midfoot with fat layer exposed: Secondary | ICD-10-CM

## 2022-03-17 DIAGNOSIS — R269 Unspecified abnormalities of gait and mobility: Secondary | ICD-10-CM

## 2022-03-17 DIAGNOSIS — N186 End stage renal disease: Secondary | ICD-10-CM

## 2022-03-17 DIAGNOSIS — E11621 Type 2 diabetes mellitus with foot ulcer: Secondary | ICD-10-CM

## 2022-03-17 DIAGNOSIS — L84 Corns and callosities: Secondary | ICD-10-CM

## 2022-03-17 DIAGNOSIS — R2689 Other abnormalities of gait and mobility: Secondary | ICD-10-CM

## 2022-03-17 NOTE — Progress Notes (Signed)
Patient presents to the office today for diabetic shoe and insole measuring.  Patient was measured with brannock device to determine size and width for 1 pair of extra depth shoes and foam casted for 3 pair of insoles.   Documentation of medical necessity will be sent to patient's treating diabetic doctor to verify and sign.   Patient's diabetic provider: Dr. Garret Reddish  Shoes and insoles will be ordered at that time and patient will be notified for an appointment for fitting when they arrive.   Shoe size (per patient): 9-9.5   Brannock measurement: RIGHT: 11C   LEFT: 10.5C  Patient shoe selection-   1st choice:   Apex X920  Shoe size ordered: Men's 11 Wide

## 2022-03-19 ENCOUNTER — Encounter (HOSPITAL_COMMUNITY)
Admission: RE | Admit: 2022-03-19 | Discharge: 2022-03-19 | Disposition: A | Discharge status: Home / Self Care | Payer: PPO | Source: Ambulatory Visit | Attending: Cardiology | Admitting: Cardiology

## 2022-03-19 ENCOUNTER — Encounter (HOSPITAL_COMMUNITY): Payer: Self-pay

## 2022-03-19 VITALS — BP 122/58 | HR 80 | Ht 67.25 in | Wt 217.6 lb

## 2022-03-19 DIAGNOSIS — Z9861 Coronary angioplasty status: Secondary | ICD-10-CM | POA: Diagnosis present

## 2022-03-19 DIAGNOSIS — I214 Non-ST elevation (NSTEMI) myocardial infarction: Secondary | ICD-10-CM | POA: Insufficient documentation

## 2022-03-19 LAB — GLUCOSE, CAPILLARY: Glucose-Capillary: 226 mg/dL — ABNORMAL HIGH (ref 70–99)

## 2022-03-19 NOTE — Progress Notes (Signed)
Cardiac Individual Treatment Plan  Patient Details  Name: Cem Kosman MRN: [id number] Date of Birth: [birth year] Referring Provider:   Flowsheet Row INTENSIVE CARDIAC REHAB ORIENT from 03/19/2022 in [place]  Referring Provider Ibrohim [name], MD       Initial Encounter Date:  [facility] from 03/19/2022 in [facility]  Date 03/19/22       Visit Diagnosis: NSTEMI (non-ST elevated myocardial infarction) ([place])  02/09/22 S/P PTCA Ostial/ Proximal LAD  Patient's Home Medications on Admission:  Current Outpatient Medications:    aspirin EC 81 MG tablet, Take 81 mg by mouth daily., Disp: , Rfl:    atorvastatin (LIPITOR) 80 MG tablet, Take 1 tablet by mouth daily., Disp: 90 tablet, Rfl: 3   calcitRIOL (ROCALTROL) 0.25 MCG capsule, Take 7 capsules (1.75 mcg total) by mouth every Monday, Wednesday, and Friday with hemodialysis., Disp: 100 capsule, Rfl: 3   carbamide peroxide (DEBROX) 6.5 % OTIC solution, Place 5 drops into both ears 2 (two) times daily as needed (earwax)., Disp: , Rfl:    Carboxymethylcellul-Glycerin (LUBRICATING EYE DROPS OP), Place 1 drop into both eyes 3 (three) times daily as needed (dry eyes)., Disp: , Rfl:    clopidogrel (PLAVIX) 75 MG tablet, Take 1 tablet (75 mg total) by mouth daily with breakfast., Disp: 90 tablet, Rfl: 3   gabapentin (NEURONTIN) 300 MG capsule, Take 1 capsule (300 mg total) by mouth 2 (two) times daily., Disp: 30 capsule, Rfl: 1   glucose blood (FREESTYLE TEST STRIPS) test strip, Use to check blood sugar daily, Disp: 100 each, Rfl: 3   glucose monitoring kit (FREESTYLE) monitoring kit, USE TO MONITOR BLOOD GLUCOSE AS DIRECTED, Disp: 1 each, Rfl: 1   Methoxy PEG-Epoetin Beta (MIRCERA IJ), as directed., Disp: , Rfl:    midodrine (PROAMATINE) 10 MG tablet, Take 1 tablet (10 mg) three times a week. Add 5 mg a night after dialysis as needed for low  blood pressure. (Patient taking differently: Take 5-10 mg by mouth See admin instructions. Take 1 tablet (10 mg) three times a week. Add 5 mg a night after dialysis as needed for low blood pressure.), Disp: 24 tablet, Rfl: 3   multivitamin (RENA-VIT) TABS tablet, Take 1 tablet by mouth daily., Disp: 90 tablet, Rfl: 3   nitroGLYCERIN (NITROSTAT) 0.4 MG SL tablet, Place 1 tablet under the tongue every 5 minutes x 3 doses as needed for chest pain., Disp: 25 tablet, Rfl: 12   oxymetazoline (AFRIN) 0.05 % nasal spray, Place 1 spray into both nostrils 2 (two) times daily as needed for congestion., Disp: 30 mL, Rfl: 0   pantoprazole (PROTONIX) 40 MG tablet, Take 1 tablet by mouth daily., Disp: 90 tablet, Rfl: 3   saccharomyces boulardii (FLORASTOR) 250 MG capsule, Take 1 capsule (250 mg total) by mouth 2 (two) times daily., Disp: 30 capsule, Rfl: 0   sevelamer carbonate (RENVELA) 800 MG tablet, Take 2 tablets (1,600 mg total) by mouth 3 (three) times daily with meals. (Patient taking differently: Take 1,600 mg by mouth See admin instructions. Take 2 tablets by mouth three times daily with meals and 1 tablet with snacks), Disp: , Rfl:    vitamin B-12 (CYANOCOBALAMIN) 1000 MCG tablet, Take 1,000 mcg by mouth daily., Disp: , Rfl:   Past Medical History: Past Medical History:  Diagnosis Date   AICD (automatic cardioverter/defibrillator) present    Medtronic pacer   Anemia    Anxiety and  depression    Arthritis    Bell's palsy    left side. after shingles episode   BPH associated with nocturia    Chronic systolic CHF (congestive heart failure) ([place])    EF normalized by Echo 2019   COPD (chronic obstructive pulmonary disease) ([place])    Severe   Coronary artery disease    a. s/p MI in 1994/1995 while in [place] s/p questionable PCI. 03/2015: progression of disease, for staged PCI.   Diabetic peripheral neuropathy (HCC)    GERD (gastroesophageal reflux disease)    Gout    Hard of hearing    B/L    History of chronic pancreatitis 07/23/2017   noted on CT abd/pelvis   History of shingles    Hypercholesterolemia    Hypertension    Obesity    Sleep apnea    "sleeps w/humidifyer when he panics and gets short of breath" (04/08/2015)   TIA (transient ischemic attack) X 3   Trigger middle finger of left hand    Type II diabetes mellitus ([place])    Wears glasses    Wears hearing aid     Tobacco Use: Social History   Tobacco Use  Smoking Status Former   Packs/day: 1.50   Years: 54.00   Total pack years: 81.00   Types: Cigarettes   Quit date: 07/18/2007   Years since quitting: 14.6   Passive exposure: Never  Smokeless Tobacco Never    Labs: Review Flowsheet  More data exists      Latest Ref Rng & Units 08/07/2020 11/11/2021 11/12/2021 01/06/2022 02/09/2022  Labs for ITP Cardiac and Pulmonary Rehab  Cholestrol 100 - 199 mg/dL - - 146  153  -  LDL (calc) 0 - 99 mg/dL - - 64  79  -  HDL-C >39 mg/dL - - 35  41  -  Trlycerides 0 - 149 mg/dL - - 237  197  -  Hemoglobin A1c 4.8 - 5.6 % 6.3     7.5  - - -  PH, Arterial 7.35 - 7.45 - - - - 7.389   PCO2 arterial 32 - 48 mmHg - - - - 46.7   Bicarbonate 20.0 - 28.0 mmol/L - - - - 28.2  29.7   TCO2 22 - 32 mmol/L - - - - 30  31   O2 Saturation % - - - - 92  72     Details       This result is from an external source.   Multiple values from one day are sorted in reverse-chronological order         Capillary Blood Glucose: Lab Results  Component Value Date   GLUCAP 226 (H) 03/19/2022   GLUCAP 150 (H) 02/10/2022   GLUCAP 106 (H) 02/10/2022   GLUCAP 95 02/09/2022   GLUCAP 123 (H) 02/09/2022     Exercise Target Goals: Exercise Program Goal: Individual exercise prescription set using results from initial 6 min walk test and THRR while considering  patient's activity barriers and safety.   Exercise Prescription Goal: Initial exercise prescription builds to 30-45 minutes a day of aerobic activity, 2-3 days per week.  Home exercise  guidelines will be given to patient during program as part of exercise prescription that the participant will acknowledge.  Activity Barriers & Risk Stratification:  Activity Barriers & Cardiac Risk Stratification - 03/19/22 1553       Activity Barriers & Cardiac Risk Stratification   Activity Barriers Back Problems;Joint Problems;Deconditioning;Muscular Weakness;Shortness of Breath;Decreased  Ventricular Function;Chest Pain/Angina;Balance Concerns;Assistive Device;History of Falls    Cardiac Risk Stratification High             6 Minute Walk:  6 Minute Walk     Row Name 03/19/22 1552         6 Minute Walk   Phase Initial  Used Nustep     Distance 2.1 feet     Walk Time 6 minutes     # of Rest Breaks 0     MPH 3.98     METS 2.91     RPE 8     Perceived Dyspnea  0     VO2 Peak 10.18     Symptoms No     Resting HR 92 bpm     Resting BP 122/58     Resting Oxygen Saturation  93 %     Exercise Oxygen Saturation  during 6 min walk 94 %     Max Ex. HR 94 bpm     Max Ex. BP 134/66     2 Minute Post BP 124/68              Oxygen Initial Assessment:   Oxygen Re-Evaluation:   Oxygen Discharge (Final Oxygen Re-Evaluation):   Initial Exercise Prescription:  Initial Exercise Prescription - 03/19/22 1500       Date of Initial Exercise RX and Referring Provider   Date 03/19/22    Referring Provider Oshua [name], MD    Expected Discharge Date 05/15/22      NuStep   Level 1    SPM 80    Minutes 25    METs 2.9      Prescription Details   Frequency (times per week) 2    Duration Progress to 30 minutes of continuous aerobic without signs/symptoms of physical distress      Intensity   THRR 40-80% of Max Heartrate 53-106    Ratings of Perceived Exertion 11-13    Perceived Dyspnea 0-4      Progression   Progression Continue progressive overload as per policy without signs/symptoms or physical distress.      Resistance Training   Training Prescription Yes     Weight 2 lbs    Reps 10-15             Perform Capillary Blood Glucose checks as needed.  Exercise Prescription Changes:   Exercise Comments:   Exercise Goals and Review:   Exercise Goals     Row Name 03/19/22 1553             Exercise Goals   Increase Physical Activity Yes       Intervention Provide advice, education, support and counseling about physical activity/exercise needs.;Develop an individualized exercise prescription for aerobic and resistive training based on initial evaluation findings, risk stratification, comorbidities and participant's personal goals.       Expected Outcomes Short Term: Attend rehab on a regular basis to increase amount of physical activity.;Long Term: Add in home exercise to make exercise part of routine and to increase amount of physical activity.;Long Term: Exercising regularly at least 3-5 days a week.       Increase Strength and Stamina Yes       Intervention Provide advice, education, support and counseling about physical activity/exercise needs.;Develop an individualized exercise prescription for aerobic and resistive training based on initial evaluation findings, risk stratification, comorbidities and participant's personal goals.       Expected Outcomes Short Term: Increase workloads from  initial exercise prescription for resistance, speed, and METs.;Short Term: Perform resistance training exercises routinely during rehab and add in resistance training at home;Long Term: Improve cardiorespiratory fitness, muscular endurance and strength as measured by increased METs and functional capacity (6MWT)       Able to understand and use rate of perceived exertion (RPE) scale Yes       Intervention Provide education and explanation on how to use RPE scale       Expected Outcomes Short Term: Able to use RPE daily in rehab to express subjective intensity level;Long Term:  Able to use RPE to guide intensity level when exercising independently        Knowledge and understanding of Target Heart Rate Range (THRR) Yes       Intervention Provide education and explanation of THRR including how the numbers were predicted and where they are located for reference       Expected Outcomes Short Term: Able to state/look up THRR;Short Term: Able to use daily as guideline for intensity in rehab;Long Term: Able to use THRR to govern intensity when exercising independently       Understanding of Exercise Prescription Yes       Intervention Provide education, explanation, and written materials on patient's individual exercise prescription       Expected Outcomes Short Term: Able to explain program exercise prescription;Long Term: Able to explain home exercise prescription to exercise independently                Exercise Goals Re-Evaluation :   Discharge Exercise Prescription (Final Exercise Prescription Changes):   Nutrition:  Target Goals: Understanding of nutrition guidelines, daily intake of sodium <1557m, cholesterol <2032m calories 30% from fat and 7% or less from saturated fats, daily to have 5 or more servings of fruits and vegetables.  Biometrics:  Pre Biometrics - 03/19/22 1355       Pre Biometrics   Waist Circumference 44 inches    Hip Circumference 44 inches    Waist to Hip Ratio 1 %    Triceps Skinfold 20 mm    % Body Fat 33.7 %    Grip Strength 24 kg    Flexibility --   Not performed   Single Leg Stand --   No performed             Nutrition Therapy Plan and Nutrition Goals:   Nutrition Assessments:  MEDIFICTS Score Key: ?70 Need to make dietary changes  40-70 Heart Healthy Diet ? 40 Therapeutic Level Cholesterol Diet    Picture Your Plate Scores: <4<37nhealthy dietary pattern with much room for improvement. 41-50 Dietary pattern unlikely to meet recommendations for good health and room for improvement. 51-60 More healthful dietary pattern, with some room for improvement.  >60 Healthy dietary pattern,  although there may be some specific behaviors that could be improved.    Nutrition Goals Re-Evaluation:   Nutrition Goals Re-Evaluation:   Nutrition Goals Discharge (Final Nutrition Goals Re-Evaluation):   Psychosocial: Target Goals: Acknowledge presence or absence of significant depression and/or stress, maximize coping skills, provide positive support system. Participant is able to verbalize types and ability to use techniques and skills needed for reducing stress and depression.  Initial Review & Psychosocial Screening:  Initial Psych Review & Screening - 03/19/22 1608       Initial Review   Current issues with Current Depression;History of Depression;Current Stress Concerns;Current Anxiety/Panic    Source of Stress Concerns Family;Chronic Illness    Comments PeGarrin  says he is depressed at times but is not depressed currently. Jacquise is experiencing some anxiety due to his church.      Family Dynamics   Good Support System? Yes   Massai lives alone he has a son who lives in the area for support.     Barriers   Psychosocial barriers to participate in program The patient should benefit from training in stress management and relaxation.      Screening Interventions   Interventions Encouraged to exercise;Provide feedback about the scores to participant    Expected Outcomes Long Term Goal: Stressors or current issues are controlled or eliminated.;Short Term goal: Identification and review with participant of any Quality of Life or Depression concerns found by scoring the questionnaire.             Quality of Life Scores:  Quality of Life - 03/19/22 1549       Quality of Life   Select Quality of Life      Quality of Life Scores   Health/Function Pre 19.14 %    Socioeconomic Pre 23.75 %    Psych/Spiritual Pre 22.5 %    Family Pre 30 %    GLOBAL Pre 22.19 %            Scores of 19 and below usually indicate a poorer quality of life in these areas.  A difference of   2-3 points is a clinically meaningful difference.  A difference of 2-3 points in the total score of the Quality of Life Index has been associated with significant improvement in overall quality of life, self-image, physical symptoms, and general health in studies assessing change in quality of life.  PHQ-9: Review Flowsheet  More data exists      03/19/2022 11/20/2021 08/30/2020 11/16/2019 08/01/2019  Depression screen PHQ 2/9  Decreased Interest 0 0 '3 3 3  ' Down, Depressed, Hopeless 0 0 '3 3 3  ' PHQ - 2 Score 0 0 '6 6 6  ' Altered sleeping - 0 '3 3 3  ' Tired, decreased energy - 0 '3 3 3  ' Change in appetite - 0 3 0 3  Feeling bad or failure about yourself  - 0 '3 3 3  ' Trouble concentrating - 0 2 0 3  Moving slowly or fidgety/restless - 0 0 0 0  Suicidal thoughts - 0 0 0 0  PHQ-9 Score - 0 '20 15 21  ' Difficult doing work/chores - Not difficult at all Not difficult at all Extremely dIfficult Very difficult   Interpretation of Total Score  Total Score Depression Severity:  1-4 = Minimal depression, 5-9 = Mild depression, 10-14 = Moderate depression, 15-19 = Moderately severe depression, 20-27 = Severe depression   Psychosocial Evaluation and Intervention:   Psychosocial Re-Evaluation:   Psychosocial Discharge (Final Psychosocial Re-Evaluation):   Vocational Rehabilitation: Provide vocational rehab assistance to qualifying candidates.   Vocational Rehab Evaluation & Intervention:  Vocational Rehab - 03/19/22 1612       Initial Vocational Rehab Evaluation & Intervention   Assessment shows need for Vocational Rehabilitation No   Eleuterio is retired and does not need vocational rehab at this time            Education: Education Goals: Education classes will be provided on a weekly basis, covering required topics. Participant will state understanding/return demonstration of topics presented.     Core Videos: Exercise    Move It!  Clinical staff conducted group or individual video  education with verbal and written material and  guidebook.  Patient learns the recommended Pritikin exercise program. Exercise with the goal of living a long, healthy life. Some of the health benefits of exercise include controlled diabetes, healthier blood pressure levels, improved cholesterol levels, improved heart and lung capacity, improved sleep, and better body composition. Everyone should speak with their doctor before starting or changing an exercise routine.  Biomechanical Limitations Clinical staff conducted group or individual video education with verbal and written material and guidebook.  Patient learns how biomechanical limitations can impact exercise and how we can mitigate and possibly overcome limitations to have an impactful and balanced exercise routine.  Body Composition Clinical staff conducted group or individual video education with verbal and written material and guidebook.  Patient learns that body composition (ratio of muscle mass to fat mass) is a key component to assessing overall fitness, rather than body weight alone. Increased fat mass, especially visceral belly fat, can put [place] at increased risk for metabolic syndrome, type 2 diabetes, heart disease, and even death. It is recommended to combine diet and exercise (cardiovascular and resistance training) to improve your body composition. Seek guidance from your physician and exercise physiologist before implementing an exercise routine.  Exercise Action Plan Clinical staff conducted group or individual video education with verbal and written material and guidebook.  Patient learns the recommended strategies to achieve and enjoy long-term exercise adherence, including variety, self-motivation, self-efficacy, and positive decision making. Benefits of exercise include fitness, good health, weight management, more energy, better sleep, less stress, and overall well-being.  Medical   Heart Disease Risk Reduction Clinical staff  conducted group or individual video education with verbal and written material and guidebook.  Patient learns our heart is our most vital organ as it circulates oxygen, nutrients, white blood cells, and hormones throughout the entire body, and carries waste away. Data supports a plant-based eating plan like the Pritikin Program for its effectiveness in slowing progression of and reversing heart disease. The video provides a number of recommendations to address heart disease.   Metabolic Syndrome and Belly Fat  Clinical staff conducted group or individual video education with verbal and written material and guidebook.  Patient learns what metabolic syndrome is, how it leads to heart disease, and how one can reverse it and keep it from coming back. You have metabolic syndrome if you have 3 of the following 5 criteria: abdominal obesity, high blood pressure, high triglycerides, low HDL cholesterol, and high blood sugar.  Hypertension and Heart Disease Clinical staff conducted group or individual video education with verbal and written material and guidebook.  Patient learns that high blood pressure, or hypertension, is very common in the [place]. Hypertension is largely due to excessive salt intake, but other important risk factors include being overweight, physical inactivity, drinking too much alcohol, smoking, and not eating enough potassium from fruits and vegetables. High blood pressure is a leading risk factor for heart attack, stroke, congestive heart failure, dementia, kidney failure, and premature death. Long-term effects of excessive salt intake include stiffening of the arteries and thickening of heart muscle and organ damage. Recommendations include ways to reduce hypertension and the risk of heart disease.  Diseases of Our Time - Focusing on Diabetes Clinical staff conducted group or individual video education with verbal and written material and guidebook.  Patient learns why the best  way to stop diseases of our time is prevention, through food and other lifestyle changes. Medicine (such as prescription pills and surgeries) is often only a Band-Aid on the problem,  not a long-term solution. Most common diseases of our time include obesity, type 2 diabetes, hypertension, heart disease, and cancer. The Pritikin Program is recommended and has been proven to help reduce, reverse, and/or prevent the damaging effects of metabolic syndrome.  Nutrition   Overview of the Pritikin Eating Plan  Clinical staff conducted group or individual video education with verbal and written material and guidebook.  Patient learns about the [place] for disease risk reduction. The [place] emphasizes a wide variety of unrefined, minimally-processed carbohydrates, like fruits, vegetables, whole grains, and legumes. Go, Caution, and Stop food choices are explained. Plant-based and lean animal proteins are emphasized. Rationale provided for low sodium intake for blood pressure control, low added sugars for blood sugar stabilization, and low added fats and oils for coronary artery disease risk reduction and weight management.  Calorie Density  Clinical staff conducted group or individual video education with verbal and written material and guidebook.  Patient learns about calorie density and how it impacts the Pritikin Eating Plan. Knowing the characteristics of the food you choose will help you decide whether those foods will lead to weight gain or weight loss, and whether you want to consume more or less of them. Weight loss is usually a side effect of the Pritikin Eating Plan because of its focus on low calorie-dense foods.  Label Reading  Clinical staff conducted group or individual video education with verbal and written material and guidebook.  Patient learns about the Pritikin recommended label reading guidelines and corresponding recommendations regarding calorie density, added  sugars, sodium content, and whole grains.  Dining Out - Part 1  Clinical staff conducted group or individual video education with verbal and written material and guidebook.  Patient learns that restaurant meals can be sabotaging because they can be so high in calories, fat, sodium, and/or sugar. Patient learns recommended strategies on how to positively address this and avoid unhealthy pitfalls.  Facts on Fats  Clinical staff conducted group or individual video education with verbal and written material and guidebook.  Patient learns that lifestyle modifications can be just as effective, if not more so, as many medications for lowering your risk of heart disease. A Pritikin lifestyle can help to reduce your risk of inflammation and atherosclerosis (cholesterol build-up, or plaque, in the artery walls). Lifestyle interventions such as dietary choices and physical activity address the cause of atherosclerosis. A review of the types of fats and their impact on blood cholesterol levels, along with dietary recommendations to reduce fat intake is also included.  Nutrition Action Plan  Clinical staff conducted group or individual video education with verbal and written material and guidebook.  Patient learns how to incorporate Pritikin recommendations into their lifestyle. Recommendations include planning and keeping personal health goals in mind as an important part of their success.  Healthy Mind-Set    Healthy Minds, Bodies, Hearts  Clinical staff conducted group or individual video education with verbal and written material and guidebook.  Patient learns how to identify when they are stressed. Video will discuss the impact of that stress, as well as the many benefits of stress management. Patient will also be introduced to stress management techniques. The way we think, act, and feel has an impact on our hearts.  How Our Thoughts Can Heal Our Hearts  Clinical staff conducted group or individual  video education with verbal and written material and guidebook.  Patient learns that negative thoughts can cause depression and anxiety. This can result in  negative lifestyle behavior and serious health problems. Cognitive behavioral therapy is an effective method to help control our thoughts in order to change and improve our emotional outlook.  Additional Videos:  Exercise    Improving Performance  Clinical staff conducted group or individual video education with verbal and written material and guidebook.  Patient learns to use a non-linear approach by alternating intensity levels and lengths of time spent exercising to help burn more calories and lose more body fat. Cardiovascular exercise helps improve heart health, metabolism, hormonal balance, blood sugar control, and recovery from fatigue. Resistance training improves strength, endurance, balance, coordination, reaction time, metabolism, and muscle mass. Flexibility exercise improves circulation, posture, and balance. Seek guidance from your physician and exercise physiologist before implementing an exercise routine and learn your capabilities and proper form for all exercise.  Introduction to Yoga  Clinical staff conducted group or individual video education with verbal and written material and guidebook.  Patient learns about yoga, a discipline of the coming together of mind, breath, and body. The benefits of yoga include improved flexibility, improved range of motion, better posture and core strength, increased lung function, weight loss, and positive self-image. Yoga's heart health benefits include lowered blood pressure, healthier heart rate, decreased cholesterol and triglyceride levels, improved immune function, and reduced stress. Seek guidance from your physician and exercise physiologist before implementing an exercise routine and learn your capabilities and proper form for all exercise.  Medical   Aging: Enhancing Your Quality of  Life  Clinical staff conducted group or individual video education with verbal and written material and guidebook.  Patient learns key strategies and recommendations to stay in good physical health and enhance quality of life, such as prevention strategies, having an advocate, securing a [place], and keeping a list of medications and system for tracking them. It also discusses how to avoid risk for bone loss.  Biology of Weight Control  Clinical staff conducted group or individual video education with verbal and written material and guidebook.  Patient learns that weight gain occurs because we consume more calories than we burn (eating more, moving less). Even if your body weight is normal, you may have higher ratios of fat compared to muscle mass. Too much body fat puts you at increased risk for cardiovascular disease, heart attack, stroke, type 2 diabetes, and obesity-related cancers. In addition to exercise, following the [place] can help reduce your risk.  Decoding Lab Results  Clinical staff conducted group or individual video education with verbal and written material and guidebook.  Patient learns that lab test reflects one measurement whose values change over time and are influenced by many factors, including medication, stress, sleep, exercise, food, hydration, pre-existing medical conditions, and more. It is recommended to use the knowledge from this video to become more involved with your lab results and evaluate your numbers to speak with your doctor.   Diseases of Our Time - Overview  Clinical staff conducted group or individual video education with verbal and written material and guidebook.  Patient learns that according to the CDC, 50% to 70% of chronic diseases (such as obesity, type 2 diabetes, elevated lipids, hypertension, and heart disease) are avoidable through lifestyle improvements including healthier food choices, listening to  satiety cues, and increased physical activity.  Sleep Disorders Clinical staff conducted group or individual video education with verbal and written material and guidebook.  Patient learns how good quality and duration of sleep are important to overall  health and well-being. Patient also learns about sleep disorders and how they impact health along with recommendations to address them, including discussing with a physician.  Nutrition  Dining Out - Part 2 Clinical staff conducted group or individual video education with verbal and written material and guidebook.  Patient learns how to plan ahead and communicate in order to maximize their dining experience in a healthy and nutritious manner. Included are recommended food choices based on the type of restaurant the patient is visiting.   Fueling a Best boy conducted group or individual video education with verbal and written material and guidebook.  There is a strong connection between our food choices and our health. Diseases like obesity and type 2 diabetes are very prevalent and are in large-part due to lifestyle choices. The Pritikin Eating Plan provides plenty of food and hunger-curbing satisfaction. It is easy to follow, affordable, and helps reduce health risks.  Menu Workshop  Clinical staff conducted group or individual video education with verbal and written material and guidebook.  Patient learns that restaurant meals can sabotage health goals because they are often packed with calories, fat, sodium, and sugar. Recommendations include strategies to plan ahead and to communicate with the manager, chef, or server to help order a healthier meal.  Planning Your Eating Strategy  Clinical staff conducted group or individual video education with verbal and written material and guidebook.  Patient learns about the [place] and its benefit of reducing the risk of disease. The [place] does not focus on  calories. Instead, it emphasizes high-quality, nutrient-rich foods. By knowing the characteristics of the foods, we choose, we can determine their calorie density and make informed decisions.  Targeting Your Nutrition Priorities  Clinical staff conducted group or individual video education with verbal and written material and guidebook.  Patient learns that lifestyle habits have a tremendous impact on disease risk and progression. This video provides eating and physical activity recommendations based on your personal health goals, such as reducing LDL cholesterol, losing weight, preventing or controlling type 2 diabetes, and reducing high blood pressure.  Vitamins and Minerals  Clinical staff conducted group or individual video education with verbal and written material and guidebook.  Patient learns different ways to obtain key vitamins and minerals, including through a recommended healthy diet. It is important to discuss all supplements you take with your doctor.   Healthy Mind-Set    Smoking Cessation  Clinical staff conducted group or individual video education with verbal and written material and guidebook.  Patient learns that cigarette smoking and tobacco addiction pose a serious health risk which affects millions of people. Stopping smoking will significantly reduce the risk of heart disease, lung disease, and many forms of cancer. Recommended strategies for quitting are covered, including working with your doctor to develop a successful plan.  Culinary   Becoming a Financial trader conducted group or individual video education with verbal and written material and guidebook.  Patient learns that cooking at home can be healthy, cost-effective, quick, and puts them in control. Keys to cooking healthy recipes will include looking at your recipe, assessing your equipment needs, planning ahead, making it simple, choosing cost-effective seasonal ingredients, and limiting the use of  added fats, salts, and sugars.  Cooking - Breakfast and Snacks  Clinical staff conducted group or individual video education with verbal and written material and guidebook.  Patient learns how important breakfast is to satiety and nutrition through the entire  day. Recommendations include key foods to eat during breakfast to help stabilize blood sugar levels and to prevent overeating at meals later in the day. Planning ahead is also a key component.  Cooking - Human resources officer conducted group or individual video education with verbal and written material and guidebook.  Patient learns eating strategies to improve overall health, including an approach to cook more at home. Recommendations include thinking of animal protein as a side on your plate rather than center stage and focusing instead on lower calorie dense options like vegetables, fruits, whole grains, and plant-based proteins, such as beans. Making sauces in large quantities to freeze for later and leaving the skin on your vegetables are also recommended to maximize your experience.  Cooking - Healthy Salads and Dressing Clinical staff conducted group or individual video education with verbal and written material and guidebook.  Patient learns that vegetables, fruits, whole grains, and legumes are the foundations of the [place]. Recommendations include how to incorporate each of these in flavorful and healthy salads, and how to create homemade salad dressings. Proper handling of ingredients is also covered. Cooking - Soups and [employer] - Soups and Desserts Clinical staff conducted group or individual video education with verbal and written material and guidebook.  Patient learns that Pritikin soups and desserts make for easy, nutritious, and delicious snacks and meal components that are low in sodium, fat, sugar, and calorie density, while high in vitamins, minerals, and filling fiber. Recommendations  include simple and healthy ideas for soups and desserts.   Overview     The Pritikin Solution Program Overview Clinical staff conducted group or individual video education with verbal and written material and guidebook.  Patient learns that the results of the [place] Program have been documented in more than 100 articles published in peer-reviewed journals, and the benefits include reducing risk factors for (and, in some cases, even reversing) high cholesterol, high blood pressure, type 2 diabetes, obesity, and more! An overview of the three key pillars of the Pritikin Program will be covered: eating well, doing regular exercise, and having a healthy mind-set.  WORKSHOPS  Exercise: Exercise Basics: Building Your Action Plan Clinical staff led group instruction and group discussion with PowerPoint presentation and patient guidebook. To enhance the learning environment the use of posters, models and videos may be added. At the conclusion of this workshop, patients will comprehend the difference between physical activity and exercise, as well as the benefits of incorporating both, into their routine. Patients will understand the FITT (Frequency, Intensity, Time, and Type) principle and how to use it to build an exercise action plan. In addition, safety concerns and other considerations for exercise and cardiac rehab will be addressed by the presenter. The purpose of this lesson is to promote a comprehensive and effective weekly exercise routine in order to improve patients' overall level of fitness.   Managing Heart Disease: Your Path to a Healthier Heart Clinical staff led group instruction and group discussion with PowerPoint presentation and patient guidebook. To enhance the learning environment the use of posters, models and videos may be added.At the conclusion of this workshop, patients will understand the anatomy and physiology of the heart. Additionally, they will understand how  Pritikin's three pillars impact the risk factors, the progression, and the management of heart disease.  The purpose of this lesson is to provide a high-level overview of the heart, heart disease, and how the Pritikin lifestyle positively impacts risk factors.  Exercise Biomechanics Clinical staff led group instruction and group discussion with PowerPoint presentation and patient guidebook. To enhance the learning environment the use of posters, models and videos may be added. Patients will learn how the structural parts of their bodies function and how these functions impact their daily activities, movement, and exercise. Patients will learn how to promote a neutral spine, learn how to manage pain, and identify ways to improve their physical movement in order to promote healthy living. The purpose of this lesson is to expose patients to common physical limitations that impact physical activity. Participants will learn practical ways to adapt and manage aches and pains, and to minimize their effect on regular exercise. Patients will learn how to maintain good posture while sitting, walking, and lifting.  Balance Training and Fall Prevention  Clinical staff led group instruction and group discussion with PowerPoint presentation and patient guidebook. To enhance the learning environment the use of posters, models and videos may be added. At the conclusion of this workshop, patients will understand the importance of their sensorimotor skills (vision, proprioception, and the vestibular system) in maintaining their ability to balance as they age. Patients will apply a variety of balancing exercises that are appropriate for their current level of function. Patients will understand the common causes for poor balance, possible solutions to these problems, and ways to modify their physical environment in order to minimize their fall risk. The purpose of this lesson is to teach patients about the  importance of maintaining balance as they age and ways to minimize their risk of falling.  WORKSHOPS   Nutrition:  Fueling a Scientist, research (physical sciences) led group instruction and group discussion with PowerPoint presentation and patient guidebook. To enhance the learning environment the use of posters, models and videos may be added. Patients will review the foundational principles of the [place] and understand what constitutes a serving size in each of the food groups. Patients will also learn Pritikin-friendly foods that are better choices when away from home and review make-ahead meal and snack options. Calorie density will be reviewed and applied to three nutrition priorities: weight maintenance, weight loss, and weight gain. The purpose of this lesson is to reinforce (in a group setting) the key concepts around what patients are recommended to eat and how to apply these guidelines when away from home by planning and selecting Pritikin-friendly options. Patients will understand how calorie density may be adjusted for different weight management goals.  Mindful Eating  Clinical staff led group instruction and group discussion with PowerPoint presentation and patient guidebook. To enhance the learning environment the use of posters, models and videos may be added. Patients will briefly review the concepts of the [place] and the importance of low-calorie dense foods. The concept of mindful eating will be introduced as well as the importance of paying attention to internal hunger signals. Triggers for non-hunger eating and techniques for dealing with triggers will be explored. The purpose of this lesson is to provide patients with the opportunity to review the basic principles of the [place], discuss the value of eating mindfully and how to measure internal cues of hunger and fullness using the Hunger Scale. Patients will also discuss reasons for non-hunger eating and  learn strategies to use for controlling emotional eating.  Targeting Your Nutrition Priorities Clinical staff led group instruction and group discussion with PowerPoint presentation and patient guidebook. To enhance the learning environment the use of posters, models and videos may be  added. Patients will learn how to determine their genetic susceptibility to disease by reviewing their family history. Patients will gain insight into the importance of diet as part of an overall healthy lifestyle in mitigating the impact of genetics and other environmental insults. The purpose of this lesson is to provide patients with the opportunity to assess their personal nutrition priorities by looking at their family history, their own health history and current risk factors. Patients will also be able to discuss ways of prioritizing and modifying the [place] for their highest risk areas  Menu  Clinical staff led group instruction and group discussion with PowerPoint presentation and patient guidebook. To enhance the learning environment the use of posters, models and videos may be added. Using menus brought in from [employer], or printed from [employer], patients will apply the [place] dining out guidelines that were presented in the [employer] video. Patients will also be able to practice these guidelines in a variety of provided scenarios. The purpose of this lesson is to provide patients with the opportunity to practice hands-on learning of the [place] with actual menus and practice scenarios.  Label Reading Clinical staff led group instruction and group discussion with PowerPoint presentation and patient guidebook. To enhance the learning environment the use of posters, models and videos may be added. Patients will review and discuss the Pritikin label reading guidelines presented in Pritikin's Label Reading Educational series video. Using fool  labels brought in from local grocery stores and markets, patients will apply the label reading guidelines and determine if the packaged food meet the Pritikin guidelines. The purpose of this lesson is to provide patients with the opportunity to review, discuss, and practice hands-on learning of the Pritikin Label Reading guidelines with actual packaged food labels. [place] Workshops are designed to teach patients ways to prepare quick, simple, and affordable recipes at home. The importance of nutrition's role in chronic disease risk reduction is reflected in its emphasis in the overall Pritikin program. By learning how to prepare essential core Pritikin Eating Plan recipes, patients will increase control over what they eat; be able to customize the flavor of foods without the use of added salt, sugar, or fat; and improve the quality of the food they consume. By learning a set of core recipes which are easily assembled, quickly prepared, and affordable, patients are more likely to prepare more healthy foods at home. These workshops focus on convenient breakfasts, simple entres, side dishes, and desserts which can be prepared with minimal effort and are consistent with nutrition recommendations for cardiovascular risk reduction. Cooking [employer] are taught by a Engineer, materials (RD) who has been trained by the [employer]. The chef or RD has a clear understanding of the importance of minimizing - if not completely eliminating - added fat, sugar, and sodium in recipes. Throughout the series of [place] Workshop sessions, patients will learn about healthy ingredients and efficient methods of cooking to build confidence in their capability to prepare    Cooking School weekly topics:  Adding Flavor- Sodium-Free  Fast and Healthy Breakfasts  Powerhouse Plant-Based Proteins  Satisfying Salads and Dressings  Simple Sides and  Sauces  International Cuisine-Spotlight on the [place] Zones  Delicious Desserts  Savory Soups  [employer] - Meals in a Agricultural consultant Appetizers and Snacks  Comforting Weekend Breakfasts  One-Pot Wonders   Fast Evening Meals  Easy Entertaining  Personalizing Your Pritikin Plate  WORKSHOPS   Healthy Mindset (Psychosocial): New Thoughts, New Behaviors Clinical staff led group instruction and group discussion with PowerPoint presentation and patient guidebook. To enhance the learning environment the use of posters, models and videos may be added. Patients will learn and practice techniques for developing effective health and lifestyle goals. Patients will be able to effectively apply the goal setting process learned to develop at least one new personal goal.  The purpose of this lesson is to expose patients to a new skill set of behavior modification techniques such as techniques setting SMART goals, overcoming barriers, and achieving new thoughts and new behaviors.  Managing Moods and Relationships Clinical staff led group instruction and group discussion with PowerPoint presentation and patient guidebook. To enhance the learning environment the use of posters, models and videos may be added. Patients will learn how emotional and chronic stress factors can impact their health and relationships. They will learn healthy ways to manage their moods and utilize positive coping mechanisms. In addition, ICR patients will learn ways to improve communication skills. The purpose of this lesson is to expose patients to ways of understanding how one's mood and health are intimately connected. Developing a healthy outlook can help build positive relationships and connections with others. Patients will understand the importance of utilizing effective communication skills that include actively listening and being heard. They will learn and understand the importance of the "4 Cs" and especially Connections in  fostering of a Healthy Mind-Set.  Healthy Sleep for a Healthy Heart Clinical staff led group instruction and group discussion with PowerPoint presentation and patient guidebook. To enhance the learning environment the use of posters, models and videos may be added. At the conclusion of this workshop, patients will be able to demonstrate knowledge of the importance of sleep to overall health, well-being, and quality of life. They will understand the symptoms of, and treatments for, common sleep disorders. Patients will also be able to identify daytime and nighttime behaviors which impact sleep, and they will be able to apply these tools to help manage sleep-related challenges. The purpose of this lesson is to provide patients with a general overview of sleep and outline the importance of quality sleep. Patients will learn about a few of the most common sleep disorders. Patients will also be introduced to the concept of "sleep hygiene," and discover ways to self-manage certain sleeping problems through simple daily behavior changes. Finally, the workshop will motivate patients by clarifying the links between quality sleep and their goals of heart-healthy living.   Recognizing and Reducing Stress Clinical staff led group instruction and group discussion with PowerPoint presentation and patient guidebook. To enhance the learning environment the use of posters, models and videos may be added. At the conclusion of this workshop, patients will be able to understand the types of stress reactions, differentiate between acute and chronic stress, and recognize the impact that chronic stress has on their health. They will also be able to apply different coping mechanisms, such as reframing negative self-talk. Patients will have the opportunity to practice a variety of stress management techniques, such as deep abdominal breathing, progressive muscle relaxation, and/or guided imagery.  The purpose of this lesson is to  educate patients on the role of stress in their lives and to provide healthy techniques for coping with it.  Learning Barriers/Preferences:  Learning Barriers/Preferences - 03/19/22 1550       Learning Barriers/Preferences   Learning Barriers Sight;Hearing   wears glasses, bilateral hearing  aids   Learning Preferences Audio;Skilled Demonstration;Computer/Internet;Verbal Instruction;Group Instruction;Video;Written Material;Individual Instruction;Pictoral             Education Topics:  Knowledge Questionnaire Score:  Knowledge Questionnaire Score - 03/19/22 1613       Knowledge Questionnaire Score   Pre Score 21/24             Core Components/Risk Factors/Patient Goals at Admission:  Personal Goals and Risk Factors at Admission - 03/19/22 1551       Core Components/Risk Factors/Patient Goals on Admission    Weight Management Yes;Weight Maintenance    Intervention Weight Management: Develop a combined nutrition and exercise program designed to reach desired caloric intake, while maintaining appropriate intake of nutrient and fiber, sodium and fats, and appropriate energy expenditure required for the weight goal.;Weight Management: Provide education and appropriate resources to help participant work on and attain dietary goals.    Expected Outcomes Weight Maintenance: Understanding of the daily nutrition guidelines, which includes 25-35% calories from fat, 7% or less cal from saturated fats, less than 235m cholesterol, less than 1.5gm of sodium, & 5 or more servings of fruits and vegetables daily;Understanding recommendations for meals to include 15-35% energy as protein, 25-35% energy from fat, 35-60% energy from carbohydrates, less than 2058mof dietary cholesterol, 20-35 gm of total fiber daily;Understanding of distribution of calorie intake throughout the day with the consumption of 4-5 meals/snacks    Diabetes Yes    Intervention Provide education about signs/symptoms and  action to take for hypo/hyperglycemia.;Provide education about proper nutrition, including hydration, and aerobic/resistive exercise prescription along with prescribed medications to achieve blood glucose in normal ranges: Fasting glucose 65-99 mg/dL    Expected Outcomes Short Term: Participant verbalizes understanding of the signs/symptoms and immediate care of hyper/hypoglycemia, proper foot care and importance of medication, aerobic/resistive exercise and nutrition plan for blood glucose control.;Long Term: Attainment of HbA1C < 7%.    Heart Failure Yes    Intervention Provide a combined exercise and nutrition program that is supplemented with education, support and counseling about heart failure. Directed toward relieving symptoms such as shortness of breath, decreased exercise tolerance, and extremity edema.    Expected Outcomes Long term: Adoption of self-care skills and reduction of barriers for early signs and symptoms recognition and intervention leading to self-care maintenance.;Short term: Daily weights obtained and reported for increase. Utilizing diuretic protocols set by physician.;Short term: Attendance in program 2-3 days a week with increased exercise capacity. Reported lower sodium intake. Reported increased fruit and vegetable intake. Reports medication compliance.;Improve functional capacity of life    Hypertension Yes    Intervention Provide education on lifestyle modifcations including regular physical activity/exercise, weight management, moderate sodium restriction and increased consumption of fresh fruit, vegetables, and low fat dairy, alcohol moderation, and smoking cessation.;Monitor prescription use compliance.    Expected Outcomes Short Term: Continued assessment and intervention until BP is < 140/9029mG in hypertensive participants. < 130/36m87m in hypertensive participants with diabetes, heart failure or chronic kidney disease.;Long Term: Maintenance of blood pressure at goal  levels.    Lipids Yes    Intervention Provide education and support for participant on nutrition & aerobic/resistive exercise along with prescribed medications to achieve LDL <70mg45mL >40mg.83mExpected Outcomes Short Term: Participant states understanding of desired cholesterol values and is compliant with medications prescribed. Participant is following exercise prescription and nutrition guidelines.;Long Term: Cholesterol controlled with medications as prescribed, with individualized exercise RX and with personalized nutrition plan. Value goals: LDL <  43m, HDL > 40 mg.    Stress Yes    Intervention Offer individual and/or small group education and counseling on adjustment to heart disease, stress management and health-related lifestyle change. Teach and support self-help strategies.;Refer participants experiencing significant psychosocial distress to appropriate mental health specialists for further evaluation and treatment. When possible, include family members and significant others in education/counseling sessions.    Expected Outcomes Short Term: Participant demonstrates changes in health-related behavior, relaxation and other stress management skills, ability to obtain effective social support, and compliance with psychotropic medications if prescribed.;Long Term: Emotional wellbeing is indicated by absence of clinically significant psychosocial distress or social isolation.             Core Components/Risk Factors/Patient Goals Review:    Core Components/Risk Factors/Patient Goals at Discharge (Final Review):    ITP Comments:  ITP Comments     Row Name 03/19/22 1437           ITP Comments Dr TFransico HimMD, Medical Director. Introduction to [facility] Progam Intensive Cardiac Rehab/ Intensive Cardiac Rehab. Initial Orientation Packet Reviewed with the patient                Comments: Participant attended orientation for the cardiac rehabilitation program on   03/19/2022  to perform initial intake and exercise walk test. Patient introduced to the [facility] and orientation packet was reviewed. Completed 6-minute walk test, measurements, initial ITP, and exercise prescription. Vital signs stable. Telemetry-Paced rhythm asymptomatic.MHarrell GaveRN BSN   Service time was from 1308 to [id number]

## 2022-03-19 NOTE — Progress Notes (Addendum)
Patient reported having angina this morning that lasted about 10 minutes. Mr Rodney Farley rated the discomfort a 3/10. Denies having any chest discomfort this afternoon. CBG 226. Blood pressure 122/58. Oxygen saturation 93% on room air. Dr [name] notified. Dr [name] said that Mr Rodney Farley is okay to proceed with 6 minute nustep test.Will continue to monitor the patient throughout  the program.[name] [name], RN,BSN 03/19/2022 2:56 PM

## 2022-03-19 NOTE — Progress Notes (Signed)
Cardiac Rehab Medication Review by a Nurse  Does the patient  feel that his/her medications are working for him/her?  YES  Has the patient been experiencing any side effects to the medications prescribed?   NO  Does the patient measure his/her own blood pressure or blood glucose at home?  YES  Does the patient have any problems obtaining medications due to transportation or finances?   NO  Understanding of regimen: good Understanding of indications: good Potential of compliance: good    Nurse comments: Rodney Farley is taking his medication as prescribed and has a good understanding of what his medications are for. Rodney Farley has a CBG meter he does not check his blood pressures on a daily basis. Rodney Farley sometimes checks his blood pressures after dialysis.    Christa See [name] RN 03/19/2022 4:16 PM

## 2022-03-20 NOTE — Progress Notes (Deleted)
Cardiology Office Note:    Date:  03/20/2022   ID:  [name], DOB 1932/05/25, MRN [id number]  PCP:  [name], MD  Cardiologist:  Nour [name], MD  Electrophysiologist:  None   Referring MD: [name], MD   Chief Complaint: hospital follow-up of NSTEMI  History of Present Illness:    Rodney Farley is a 88 y.o. male with a history of CAD with remote MI in 48 while in [place], PCIs to LAD/ 2nd Diag/ OM3 in 2016, and recent NSTEMI on 11/11/2021 s/p DES to ostial to LAD; ischemic cardiomyopathy/ chronic systolic CHF with EF as low as 25-30% in 2016 with subsequent normalization and then EF of 45-50% on most recent Echo on 11/11/2021; complete heart block s/p PPM which was upgraded to ICD-CRT in 2017 (gen change in 09/2021); subclinical atrial fibrillation noted on device in 2021 not on anticoagulation, moderate aortic stenosis, hypertension; hyperlipidemia; type 2 diabetes mellitus; ESRD on hemodialysis; obstructive sleep apnea; COPD; TIA; and BPH who is followed by Dr. [name] and Dr. Caryl Comes and presents today for hospital follow-up of NSTEMI.  Patient was recently admitted from 11/11/2021 to 11/13/2021 for NSTEMI after presenting with sharp substernal chest pain radiating to the left upper extremity with associated nausea, diaphoresis, and dyspnea. High-sensitivity troponin peaked at 4,677. Echo showed LVEF of 45-50% with akinesis of the apical septal segment, apical inferior segment, and apex as well as grade 1 diastolic dysfunction and moderate aortic stenosis. LHC on 11/12/2021 90% stenosis of ostial to proximal LAD, 90% stenosis of ostial 1st Diag, and 70% stenosis of ostial to proximal RCA, and then mild disease in OM3, 2nd Diag, and LPAV. He underwent successful PCI with DES to ostial to proximal LAD lesion with a 3.0 x 24 mm Synergy stent. This was post dilated to 3.5 mm. This was proximal to the old stent.He was started on DAPT with Aspirin and Plavix and home Lipitor  was increased.  He was readmitted from 12/23/2021 to 12/25/2021 for NSTEMI after presenting with chest pain that radiated to bilateral arms with associated nausea and vomiting. Pain improved with Nitro. High-sensitivity troponin 223 >> 653 >> 1,073. Repeat LHC which showed 90% in-stent restenosis of distal left main to ostial LAD with thrombus and  stent underexpansion at the ostium with OCT. Patient underwent difficult but successful PCI with PCTA of this lesion. Dilated to high pressures with a 4.0 mm [place] balloon.  He was continued on DAPT but Coreg was stopped due to low BP.   The patient notes that the following day he continued to have angina. It is occurring daily. It is relieved typically after 20 minutes with one sl Ntg. He doesn't really have pain when at dialysis. Typically awakens him from sleep. He still has issues with low BP and has to take midodrine on dialysis days.  To evaluate the contribution of his aortic stenosis he had a calcium score of the aortic valve with a score of 1304. This was not consistent with severe AS.   He was readmitted with NSTEMI on 02/08/22. Peak troponin 498. Repeat cardiac cath showed restenosis of the proximal LAD stent. Intravascular imaging again showed the stent was not fully expanded due to calcification. Intracoronary lithotripsy was performed with aggressive dilation with a 4.0 [place] balloon to 22 atm. Additional stenting was not done due to inability to expand fully. His other disease was not changed significantly.   He is seen today with his daughter in law. He is  feeling much better with improvement in angina. He is walking in his apartment corridor for 5 minutes at a time. Dyspnea is improved. No cath site complications.    Past Medical History:  Diagnosis Date   AICD (automatic cardioverter/defibrillator) present    Medtronic pacer   Anemia    Anxiety and depression    Arthritis    Bell's palsy    left side. after shingles episode   BPH associated  with nocturia    Chronic systolic CHF (congestive heart failure) ([facility])    EF normalized by Echo 2019   COPD (chronic obstructive pulmonary disease) ([place])    Severe   Coronary artery disease    a. s/p MI in 1994/1995 while in [place] s/p questionable PCI. 03/2015: progression of disease, for staged PCI.   Diabetic peripheral neuropathy (HCC)    GERD (gastroesophageal reflux disease)    Gout    Hard of hearing    B/L   History of chronic pancreatitis 07/23/2017   noted on CT abd/pelvis   History of shingles    Hypercholesterolemia    Hypertension    Obesity    Sleep apnea    "sleeps w/humidifyer when he panics and gets short of breath" (04/08/2015)   TIA (transient ischemic attack) X 3   Trigger middle finger of left hand    Type II diabetes mellitus ([place])    Wears glasses    Wears hearing aid     Past Surgical History:  Procedure Laterality Date   APPENDECTOMY     AV FISTULA PLACEMENT Right 02/07/2019   Procedure: ARTERIOVENOUS (AV) FISTULA CREATION RIGHT UPPER ARM;  Surgeon: Waynetta Sandy, MD;  Location: [facility];  Service: Vascular;  Laterality: Right;   BIV ICD GENERATOR CHANGEOUT N/A 10/09/2021   Procedure: BIV ICD GENERATOR CHANGEOUT;  Surgeon: Deboraha Sprang, MD;  Location: [facility];  Service: Cardiovascular;  Laterality: N/A;   CARDIAC CATHETERIZATION N/A 03/29/2015   Procedure: Right/Left Heart Cath and Coronary Angiography;  Surgeon: Cristan M [name], MD;  Location: [facility];  Service: Cardiovascular;  Laterality: N/A;   [place]   "after my MI; put me on heart RX after cath"   CARDIAC CATHETERIZATION N/A 04/09/2015   Procedure: Coronary Stent Intervention;  Surgeon: Rease M [name], MD;  Location: [facility];  Service: Cardiovascular;  Laterality: N/A;   CATARACT EXTRACTION W/ INTRAOCULAR LENS  IMPLANT, BILATERAL     CHOLECYSTECTOMY N/A 10/13/2017   Procedure: LAPAROSCOPIC CHOLECYSTECTOMY WITH LYSIS OF ADHESIONS;   Surgeon: Ileana Roup, MD;  Location: [facility];  Service: General;  Laterality: N/A;   COLONOSCOPY     CORONARY ANGIOGRAPHY N/A 11/12/2021   Procedure: CORONARY ANGIOGRAPHY;  Surgeon: Wellington Hampshire, MD;  Location: [facility];  Service: Cardiovascular;  Laterality: N/A;   CORONARY ANGIOGRAPHY N/A 12/24/2021   Procedure: CORONARY ANGIOGRAPHY;  Surgeon: Troy Sine, MD;  Location: [facility];  Service: Cardiovascular;  Laterality: N/A;   CORONARY BALLOON ANGIOPLASTY N/A 12/24/2021   Procedure: CORONARY BALLOON ANGIOPLASTY;  Surgeon: Troy Sine, MD;  Location: [facility];  Service: Cardiovascular;  Laterality: N/A;   CORONARY BALLOON ANGIOPLASTY N/A 02/09/2022   Procedure: CORONARY BALLOON ANGIOPLASTY;  Surgeon: Nelva Bush, MD;  Location: [facility];  Service: Cardiovascular;  Laterality: N/A;  LAD   CORONARY STENT INTERVENTION N/A 11/12/2021   Procedure: CORONARY STENT INTERVENTION;  Surgeon: Wellington Hampshire, MD;  Location:  [facility];  Service: Cardiovascular;  Laterality: N/A;  LAD   DENTAL SURGERY     EP IMPLANTABLE DEVICE N/A 09/23/2015   MDT CRT-D, Dr. Caryl Comes   HIATAL HERNIA REPAIR  1977   ILEOCECETOMY N/A 03/27/2017   Procedure: ILEOCECECTOMY;  Surgeon: Ileana Roup, MD;  Location: [facility];  Service: General;  Laterality: N/A;   INSERT / REPLACE / REMOVE PACEMAKER  07/2008   Complete heart block status post DDD with good function   INTRAVASCULAR IMAGING/OCT N/A 12/24/2021   Procedure: INTRAVASCULAR IMAGING/OCT;  Surgeon: Troy Sine, MD;  Location: [facility];  Service: Cardiovascular;  Laterality: N/A;   INTRAVASCULAR LITHOTRIPSY  02/09/2022   Procedure: INTRAVASCULAR LITHOTRIPSY;  Surgeon: Nelva Bush, MD;  Location: [facility];  Service: Cardiovascular;;  LAD   INTRAVASCULAR ULTRASOUND/IVUS N/A 02/09/2022   Procedure: Intravascular Ultrasound/IVUS;  Surgeon: Nelva Bush, MD;  Location: [facility];  Service: Cardiovascular;  Laterality: N/A;  LAD   IR FLUORO GUIDE CV LINE RIGHT  04/03/2019   IR US GUIDE VASC ACCESS RIGHT  04/03/2019   LAPAROTOMY N/A 03/27/2017   Procedure: EXPLORATORY LAPAROTOMY;  Surgeon: Ileana Roup, MD;  Location: [facility];  Service: General;  Laterality: N/A;   RIGHT HEART CATH AND CORONARY ANGIOGRAPHY N/A 02/09/2022   Procedure: RIGHT HEART CATH AND CORONARY ANGIOGRAPHY;  Surgeon: Nelva Bush, MD;  Location: [facility];  Service: Cardiovascular;  Laterality: N/A;   TONSILLECTOMY     UPPER GI ENDOSCOPY      Current Medications: No outpatient medications have been marked as taking for the 03/24/22 encounter (Appointment) with [name], Keric M, MD.     Allergies:   Bee venom, Lyrica [pregabalin], Prednisone, and Zocor [simvastatin]   Social History   Socioeconomic History   Marital status: Legally Separated    Spouse name: Not on file   Number of children: 1   Years of education: college   Highest education level: Not on file  Occupational History   Occupation: Retired  Tobacco Use   Smoking status: Former    Packs/day: 1.50    Years: 54.00    Total pack years: 81.00    Types: Cigarettes    Quit date: 07/18/2007    Years since quitting: 14.6    Passive exposure: Never   Smokeless tobacco: Never  Vaping Use   Vaping Use: Never used  Substance and Sexual Activity   Alcohol use: Yes    Comment: rare   Drug use: No   Sexual activity: Not Currently  Other Topics Concern   Not on file  Social History Narrative   Married 1994 (together since 1989) 1 son, 1 stepson. 3 grandkids, 6 great grandkids      Retired from [employer]. Had 2 years of collge.       Faith: Mormon      Here on [employer] since 2004 from [place];    Social Determinants of Health   Financial Resource Strain: Not on file  Food Insecurity: Not on file  Transportation Needs: Not on file  Physical Activity: Not on file  Stress: Not on file   Social Connections: Not on file     Family History: [name] family history includes Heart attack in his brother; Leukemia in his father; Stroke in his mother and sister.  ROS:   Please see the history of present illness.     EKGs/Labs/Other Studies Reviewed:    The following  studies were reviewed today:  Echocardiogram 11/11/2021: Impressions:  1. Left ventricular ejection fraction, by estimation, is 45 to 50%. The  left ventricle has mildly decreased function. The left ventricle  demonstrates regional wall motion abnormalities (see scoring  diagram/findings for description). The left ventricular   internal cavity size was mildly dilated. There is mild left ventricular  hypertrophy. Left ventricular diastolic parameters are consistent with  Grade I diastolic dysfunction (impaired relaxation).   2. Right ventricular systolic function is normal. The right ventricular  size is normal. Tricuspid regurgitation signal is inadequate for assessing  PA pressure.   3. Left atrial size was moderately dilated.   4. The mitral valve is degenerative. Trivial mitral valve regurgitation.  No evidence of mitral stenosis.   5. The aortic valve was not well visualized. Aortic valve regurgitation  is not visualized. Moderate aortic valve stenosis. Vmax 2.9 m/s, MG  35mmHg, AVA 1.2 cm^2, DI 0.38. _______________   Coronary Stent Intervention 11/12/2021:   Ost 1st Diag to 1st Diag lesion is 90% stenosed.   Ost RCA to Prox RCA lesion is 70% stenosed.   3rd Mrg lesion is 10% stenosed.   2nd Diag-2 lesion is 10% stenosed.   Mid LAD lesion is 20% stenosed.   1st Diag lesion is 30% stenosed.   2nd Diag-1 lesion is 40% stenosed.   LPAV lesion is 40% stenosed.   Ost LAD to Prox LAD lesion is 90% stenosed.   A drug-eluting stent was successfully placed using a SYNERGY XD 3.0X24.   Post intervention, there is a 0% residual stenosis.   1.  Patent LAD, diagonal and OM stent with no significant  restenosis.  Severe 90% stenosis in ostial/proximal LAD is a likely culprit for myocardial infarction.  70% stenosis in ostial right coronary artery but the vessel is nondominant. 2.  Successful angioplasty and drug-eluting stent placement to the ostial/proximal LAD. 3.  I could not cross the aortic valve due to significant iliac and aortic tortuosity and I elected not to use a straight wire given that the patient just had an echocardiogram showing moderate aortic stenosis.   Recommendations: Dual antiplatelet therapy for at least 12 months. Aggressive treatment of risk factors.   Diagnostic Dominance: Left Intervention     _______________  Left Cardiac Catheterization 12/24/2021:   Dist LM to Ost LAD lesion is 90% stenosed.   Prox Cx lesion is 30% stenosed.   Mid Cx to Dist Cx lesion is 40% stenosed.   3rd Mrg lesion is 50% stenosed.   Mid Cx lesion is 30% stenosed.   Ost RCA to Prox RCA lesion is 70% stenosed.   Previously placed 2nd Diag stent of unknown type is  widely patent.   Post intervention, there is a 0% residual stenosis.   NSTEMI secondary to focal 90% proximal in-stent restenosis with thrombus and probable stent underexpansion.   Mild residual nonobstructive CAD in the left circumflex vessel with 30, 40 and 50% stenoses.  A previously placed stent in the OM3 vessel was difficult to visualize.   No significant change in the previous 70% proximal RCA stenosis in nondominant RCA vessel.   OCT demonstrating prior LAD stent underexpansion ostially with calcification and plaque.   Difficult but successful PCI to the ostium proximal LAD stent treated with PTCA with a 2.5 x 12 mm, 3.0 x 12 mm compliant balloon, 3.5 and 4.0 x 15 mm noncompliant balloons.   Recommendation: DAPT indefinitely.  Medical therapy for concomitant CAD.  The patient is  on dialysis on Monday, Wednesday, and Fridays.  Aggressive lipid-lowering therapy with target LDL less than  55.  Diagnostic Dominance: Right  Intervention    CLINICAL DATA:  Cardiovascular Disease Risk stratification   EXAM: Coronary Calcium Score   TECHNIQUE: A gated, non-contrast computed tomography scan of the heart was performed using 54mm slice thickness. Axial images were analyzed on a dedicated workstation. Calcium scoring of the coronary arteries was performed using the Agatston method.   MEDICATIONS: MEDICATIONS No medications.   FINDINGS: Coronary arteries: Normal origins.  Stents noted.   Unable to measure coronary calcium score with stents present and BiV pacemaker present.   Aortic valve calcium score 1304 Agatston units.   Pericardium: Normal.   Ascending Aorta: Normal caliber.   There is a biventricular pacemaker present.   Non-cardiac: See separate report from [facility].   IMPRESSION: Aortic valve calcium score 1304 Agatston units. For a male, this is not suggestive of severe AS (>2000 Agatston units).   RECOMMENDATIONS: Coronary artery calcium (CAC) score is a strong predictor of incident coronary heart disease (CHD) and provides predictive information beyond traditional risk factors. CAC scoring is reasonable to use in the decision to withhold, postpone, or initiate statin therapy in intermediate-risk or selected borderline-risk asymptomatic adults (age 57-75 years and LDL-C >=70 to <190 mg/dL) who do not have diabetes or established atherosclerotic cardiovascular disease (ASCVD).* In intermediate-risk (10-year ASCVD risk >=7.5% to <20%) adults or selected borderline-risk (10-year ASCVD risk >=5% to <7.5%) adults in whom a CAC score is measured for the purpose of making a treatment decision the following recommendations have been made:   If CAC=0, it is reasonable to withhold statin therapy and reassess in 5 to 10 years, as long as higher risk conditions are absent (diabetes mellitus, family history of premature CHD in first  degree relatives (males <55 years; females <65 years), cigarette smoking, or LDL >=190 mg/dL).   If CAC is 1 to 99, it is reasonable to initiate statin therapy for patients >=54 years of age.   If CAC is >=100 or >=75th percentile, it is reasonable to initiate statin therapy at any age.   Cardiology referral should be considered for patients with CAC scores >=400 or >=75th percentile.   *2018 AHA/ACC/AACVPR/AAPA/ABC/ACPM/ADA/AGS/APhA/ASPC/NLA/PCNA Guideline on the Management of Blood Cholesterol: A Report of the American College of Cardiology/American Heart Association Task Force on Clinical Practice Guidelines. J Am Coll Cardiol. 2019;73(24):3168-3209.   Loralie Champagne, MD   Electronically Signed: By: Loralie Champagne M.D. On: 02/03/2022 14:44    02/09/22: Procedures  INTRAVASCULAR LITHOTRIPSY  CORONARY BALLOON ANGIOPLASTY  Intravascular Ultrasound/IVUS  RIGHT HEART CATH AND CORONARY ANGIOGRAPHY   Conclusion  Conclusions: Significant single-vessel coronary artery disease with 90% in-stent restenosis (minimal luminal area 2.9 mm) at site of stent placement in 11/2021 and repeat angioplasty in 12/2021. Moderate LCx disease that appears stable from prior catheterizations. Stable 70% proximal stenosis of nondominant RCA Patent D2 stent with 30% stenosis at the ostium of D2 before the stent.  Reported OM stent is not clearly visualized. Moderately to severely elevated left and right heart filling pressures (PCWP 33 mmHg, mean RA 14 mmHg). Normal to supranormal cardiac output, likely accentuated by dialysis fistula. Successful PTCA with intracoronary lithotripsy of the ostial/proximal LAD in-stent restenosis with reduction in stenosis from 90% to 20% and improvement in flow from TIMI-2 to TIMI-3.  Additional stent placement at the ostium of the LAD was not pursued given inability to completely expand the vessel despite lithotripsy and  high-pressure angioplasty.    Recommendations: Remove right femoral artery and venous sheaths after ACT has fallen below 175 seconds with manual compression. Continue indefinite dual antiplatelet therapy with aspirin and clopidogrel. Aggressive secondary prevention of coronary artery disease. Proceed with hemodialysis later today, if possible, given elevated filling pressures. Limited echo to reassess LVEF and aortic stenosis.   Nelva Bush, MD [facility]  Coronary Diagrams  Diagnostic Dominance: Right  Intervention   EKG:  EKG  is not ordered today.   Recent Labs: 11/11/2021: TSH 1.400 01/06/2022: Magnesium 2.3 02/08/2022: ALT 25 02/10/2022: BUN 20; Creatinine, Ser 5.58; Hemoglobin 10.0; Platelets 162; Potassium 4.6; Sodium 135  Recent Lipid Panel    Component Value Date/Time   CHOL 153 01/06/2022 1228   TRIG 197 (H) 01/06/2022 1228   HDL 41 01/06/2022 1228   CHOLHDL 3.7 01/06/2022 1228   CHOLHDL 4.2 11/12/2021 0554   VLDL 47 (H) 11/12/2021 0554   LDLCALC 79 01/06/2022 1228   LDLDIRECT 60.0 04/13/2018 0926    Physical Exam:    Vital Signs: There were no vitals taken for this visit.    Wt Readings from Last 3 Encounters:  03/19/22 217 lb 9.5 oz (98.7 kg)  02/17/22 214 lb (97.1 kg)  02/10/22 214 lb 11.7 oz (97.4 kg)     General: 88 y.o. Caucasian male, elderly,  in no acute distress. HEENT: Normocephalic and atraumatic. Sclera clear.  Neck: Supple. No JVD. Heart: RRR. III/VI systolic murmur best heard at upper sternal border.  Lungs: No increased work of breathing. Clear to ausculation bilaterally. No wheezes, rhonchi, or rales.  Abdomen: Soft, non-distended, and non-tender to palpation.  Extremities: No lower extremity edema.    Skin: Warm and dry. Neuro: Alert and oriented x3. No focal deficits. Psych: Normal affect. Responds appropriately.    Assessment/Plan:   CAD s/p multiple stents in 2016. Presented in May with severe stenosis ostial-proximal LAD treated with DES. Had early  restenosis with ?thrombus in June related to stent underexpansion/calcification. Treated with high pressure  balloon to 4.0 mm with OCT guidance. Readmitted July 30 with NSTEMI. Restenosis of the proximal LAD stent treated with lithotripsy and repeat high pressure balloon inflation. He is clinically improved. Unfortunately has early restenosis x 2 related to stent under expansion from heavy calcification. He is s/p lithotripsy on most recent procedure and hopefull this will help but I explained that he is at high risk for recurrence and additional therapies are very limited. He is not a surgical candidate and performing balloon angioplasty every month is not really a solution.  - Continue DAPT with Aspirin and Plavix. - Continue high-intensity statin. - no good options for antianginal therapy given hypotension with dialysis. Not a candidate for Ranexa with ESRD.  - encouraged him to liberally use sl Ntg as needed for chest pain.     2. Ischemic Cardiomyopathy Chronic Systolic CHF Echo in 02/8890 showed LVEF 45-50% with akinesis of the apical septal segment, apical inferior segment, and apex as well as grade 1 diastolic dysfunction. - Euvolemic on exam.  - Volume status is being managed by hemodialysis.  - Previously on Coreg but this was stopped due to hypotension.  - No ACEi/ARB due to renal function.   3. Moderate Aortic Stenosis Noted on Echo during recent admission. - I personally reviewed Echo.  -by CT calcium score of the AV is not in the severe range.   - patient's AS is not severe enough to consider for TAVR.    4. Complete  Heart Block S/p ICD-CRT with recent gen change in 09/2021. - Followed by Dr. Caryl Comes.   5. Subclinical Atrial Fibrillation History of atrial fibrillation noted on prior device check in 2021. Not on anticoagulation due to low atrial fibrillation burden. No atrial fibrillation noted on device interrogations since 06/2020.    6. Hypotension Previously had  hypertension but now struggles with hypotension  - BP is OK today - on no BP lowering medication. - Takes Midodrine $RemoveBeforeD'10mg'[id number]$  on dialysis days. OK to take additional $RemoveBeforeD'5mg'VsdyAwNkwsvwut$  on non-dialysis days if needed.  7. Hyperlipidemia - Continue Lipitor $RemoveBeforeDE'80mg'[id number]$  daily.  - last LDL 79 in June   8. Type 2 Diabetes Mellitus Hemoglobin A1c 7.5 in 11/2021. - Management per PCP.   9. ESRD On hemodialysis on M/W/F. Takes Midodrine on dialysis days. - Management per Nephrology.  Follow up in one month.  Medication Adjustments/Labs and Tests Ordered: Current medicines are reviewed at length with the patient today.  Concerns regarding medicines are outlined above.  No orders of the defined types were placed in this encounter.  No orders of the defined types were placed in this encounter.      Signed, Kolyn [name], MD  03/20/2022 9:04 AM    [facility]

## 2022-03-22 ENCOUNTER — Other Ambulatory Visit: Payer: Self-pay | Admitting: Podiatry

## 2022-03-23 ENCOUNTER — Encounter (HOSPITAL_COMMUNITY)
Admission: RE | Admit: 2022-03-23 | Discharge: 2022-03-23 | Disposition: A | Discharge status: Home / Self Care | Payer: PPO | Source: Ambulatory Visit | Attending: Cardiology | Admitting: Cardiology

## 2022-03-23 ENCOUNTER — Ambulatory Visit: Payer: PPO | Admitting: Cardiology

## 2022-03-23 ENCOUNTER — Inpatient Hospital Stay (HOSPITAL_COMMUNITY)
Admission: RE | Admit: 2022-03-23 | Discharge: 2022-03-23 | Disposition: A | Discharge status: Home / Self Care | Payer: PPO | Source: Ambulatory Visit

## 2022-03-23 ENCOUNTER — Telehealth (HOSPITAL_COMMUNITY): Payer: Self-pay | Admitting: *Deleted

## 2022-03-23 DIAGNOSIS — I214 Non-ST elevation (NSTEMI) myocardial infarction: Secondary | ICD-10-CM

## 2022-03-23 DIAGNOSIS — Z9861 Coronary angioplasty status: Secondary | ICD-10-CM

## 2022-03-23 LAB — GLUCOSE, CAPILLARY: Glucose-Capillary: 199 mg/dL — ABNORMAL HIGH (ref 70–99)

## 2022-03-23 NOTE — Telephone Encounter (Signed)
Called and spoke to Rodney Farley who is Rodney Farley's daughter in law and listed on his DPR regarding this mornings events. Rodney Farley and Rodney Farley son are out of town making funeral arrangements for Rodney Farley. Rodney Farley stated that Rodney Farley was not suppose to drive himself to cardiac rehab.  A ride had been arranged as the car Rodney Farley drove broke down last week unable to determine why.  Asked had she seen any of these "shaking episodes" before.  She stated she had and noticed that they were increasing in the last couple of weeks. Had not alerted his PCP but was planning to once they returned home. This is also why they did not want him driving.  Advised that with the schedule for his dialysis and the timing of our classes this is too difficult for him to manage. Agreed with not proceeding with cardiac rehab at this time. Mentioned to [employer] - very interested. Advised that contact information for the BOOST program was given to Dr. Yong Channel.Cherre Huger, BSN Cardiac and Training and development officer
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
## 2022-03-24 ENCOUNTER — Ambulatory Visit: Payer: PPO | Attending: Cardiology | Admitting: Cardiology

## 2022-03-25 ENCOUNTER — Ambulatory Visit (HOSPITAL_COMMUNITY): Payer: PPO

## 2022-03-30 ENCOUNTER — Ambulatory Visit (HOSPITAL_COMMUNITY): Payer: PPO

## 2022-03-30 ENCOUNTER — Other Ambulatory Visit: Payer: Self-pay

## 2022-03-30 ENCOUNTER — Telehealth: Payer: Self-pay | Admitting: Cardiology

## 2022-03-30 ENCOUNTER — Emergency Department (HOSPITAL_COMMUNITY): Payer: PPO

## 2022-03-30 ENCOUNTER — Encounter (HOSPITAL_COMMUNITY): Payer: Self-pay

## 2022-03-30 ENCOUNTER — Inpatient Hospital Stay (HOSPITAL_COMMUNITY)
Admission: EM | Admit: 2022-03-30 | Discharge: 2022-04-02 | DRG: 250 | Disposition: A | Discharge status: Home Health | Payer: PPO | Attending: Internal Medicine | Admitting: Internal Medicine

## 2022-03-30 DIAGNOSIS — Z9842 Cataract extraction status, left eye: Secondary | ICD-10-CM

## 2022-03-30 DIAGNOSIS — I4891 Unspecified atrial fibrillation: Secondary | ICD-10-CM | POA: Diagnosis not present

## 2022-03-30 DIAGNOSIS — Z974 Presence of external hearing-aid: Secondary | ICD-10-CM

## 2022-03-30 DIAGNOSIS — F32A Depression, unspecified: Secondary | ICD-10-CM | POA: Diagnosis present

## 2022-03-30 DIAGNOSIS — R079 Chest pain, unspecified: Secondary | ICD-10-CM | POA: Diagnosis not present

## 2022-03-30 DIAGNOSIS — Z79899 Other long term (current) drug therapy: Secondary | ICD-10-CM

## 2022-03-30 DIAGNOSIS — E875 Hyperkalemia: Secondary | ICD-10-CM | POA: Diagnosis present

## 2022-03-30 DIAGNOSIS — Z961 Presence of intraocular lens: Secondary | ICD-10-CM | POA: Diagnosis present

## 2022-03-30 DIAGNOSIS — I950 Idiopathic hypotension: Secondary | ICD-10-CM

## 2022-03-30 DIAGNOSIS — N2581 Secondary hyperparathyroidism of renal origin: Secondary | ICD-10-CM | POA: Diagnosis present

## 2022-03-30 DIAGNOSIS — I252 Old myocardial infarction: Secondary | ICD-10-CM

## 2022-03-30 DIAGNOSIS — Z87891 Personal history of nicotine dependence: Secondary | ICD-10-CM

## 2022-03-30 DIAGNOSIS — K219 Gastro-esophageal reflux disease without esophagitis: Secondary | ICD-10-CM | POA: Diagnosis present

## 2022-03-30 DIAGNOSIS — G250 Essential tremor: Secondary | ICD-10-CM | POA: Diagnosis present

## 2022-03-30 DIAGNOSIS — D631 Anemia in chronic kidney disease: Secondary | ICD-10-CM | POA: Diagnosis present

## 2022-03-30 DIAGNOSIS — T82855A Stenosis of coronary artery stent, initial encounter: Secondary | ICD-10-CM | POA: Diagnosis not present

## 2022-03-30 DIAGNOSIS — G40409 Other generalized epilepsy and epileptic syndromes, not intractable, without status epilepticus: Secondary | ICD-10-CM

## 2022-03-30 DIAGNOSIS — Z806 Family history of leukemia: Secondary | ICD-10-CM

## 2022-03-30 DIAGNOSIS — Y831 Surgical operation with implant of artificial internal device as the cause of abnormal reaction of the patient, or of later complication, without mention of misadventure at the time of the procedure: Secondary | ICD-10-CM | POA: Diagnosis present

## 2022-03-30 DIAGNOSIS — E119 Type 2 diabetes mellitus without complications: Secondary | ICD-10-CM

## 2022-03-30 DIAGNOSIS — Z515 Encounter for palliative care: Secondary | ICD-10-CM

## 2022-03-30 DIAGNOSIS — Z8619 Personal history of other infectious and parasitic diseases: Secondary | ICD-10-CM

## 2022-03-30 DIAGNOSIS — Z91030 Bee allergy status: Secondary | ICD-10-CM

## 2022-03-30 DIAGNOSIS — H9193 Unspecified hearing loss, bilateral: Secondary | ICD-10-CM | POA: Diagnosis present

## 2022-03-30 DIAGNOSIS — E1122 Type 2 diabetes mellitus with diabetic chronic kidney disease: Secondary | ICD-10-CM | POA: Diagnosis present

## 2022-03-30 DIAGNOSIS — I5022 Chronic systolic (congestive) heart failure: Secondary | ICD-10-CM | POA: Diagnosis present

## 2022-03-30 DIAGNOSIS — E7800 Pure hypercholesterolemia, unspecified: Secondary | ICD-10-CM | POA: Diagnosis present

## 2022-03-30 DIAGNOSIS — Z7189 Other specified counseling: Secondary | ICD-10-CM

## 2022-03-30 DIAGNOSIS — E669 Obesity, unspecified: Secondary | ICD-10-CM | POA: Diagnosis present

## 2022-03-30 DIAGNOSIS — Z7902 Long term (current) use of antithrombotics/antiplatelets: Secondary | ICD-10-CM

## 2022-03-30 DIAGNOSIS — E785 Hyperlipidemia, unspecified: Secondary | ICD-10-CM | POA: Diagnosis present

## 2022-03-30 DIAGNOSIS — Z6834 Body mass index (BMI) 34.0-34.9, adult: Secondary | ICD-10-CM

## 2022-03-30 DIAGNOSIS — Z9841 Cataract extraction status, right eye: Secondary | ICD-10-CM

## 2022-03-30 DIAGNOSIS — Z823 Family history of stroke: Secondary | ICD-10-CM

## 2022-03-30 DIAGNOSIS — Z7982 Long term (current) use of aspirin: Secondary | ICD-10-CM

## 2022-03-30 DIAGNOSIS — M109 Gout, unspecified: Secondary | ICD-10-CM | POA: Diagnosis present

## 2022-03-30 DIAGNOSIS — Z888 Allergy status to other drugs, medicaments and biological substances status: Secondary | ICD-10-CM

## 2022-03-30 DIAGNOSIS — Z66 Do not resuscitate: Secondary | ICD-10-CM | POA: Diagnosis present

## 2022-03-30 DIAGNOSIS — F419 Anxiety disorder, unspecified: Secondary | ICD-10-CM | POA: Diagnosis present

## 2022-03-30 DIAGNOSIS — D509 Iron deficiency anemia, unspecified: Secondary | ICD-10-CM | POA: Diagnosis present

## 2022-03-30 DIAGNOSIS — I9589 Other hypotension: Secondary | ICD-10-CM | POA: Diagnosis present

## 2022-03-30 DIAGNOSIS — I200 Unstable angina: Secondary | ICD-10-CM | POA: Diagnosis present

## 2022-03-30 DIAGNOSIS — Z9049 Acquired absence of other specified parts of digestive tract: Secondary | ICD-10-CM

## 2022-03-30 DIAGNOSIS — R569 Unspecified convulsions: Secondary | ICD-10-CM

## 2022-03-30 DIAGNOSIS — J449 Chronic obstructive pulmonary disease, unspecified: Secondary | ICD-10-CM | POA: Diagnosis present

## 2022-03-30 DIAGNOSIS — N401 Enlarged prostate with lower urinary tract symptoms: Secondary | ICD-10-CM | POA: Diagnosis present

## 2022-03-30 DIAGNOSIS — Z8673 Personal history of transient ischemic attack (TIA), and cerebral infarction without residual deficits: Secondary | ICD-10-CM

## 2022-03-30 DIAGNOSIS — Z95810 Presence of automatic (implantable) cardiac defibrillator: Secondary | ICD-10-CM

## 2022-03-30 DIAGNOSIS — I25110 Atherosclerotic heart disease of native coronary artery with unstable angina pectoris: Secondary | ICD-10-CM | POA: Diagnosis not present

## 2022-03-30 DIAGNOSIS — R251 Tremor, unspecified: Secondary | ICD-10-CM | POA: Diagnosis present

## 2022-03-30 DIAGNOSIS — N186 End stage renal disease: Secondary | ICD-10-CM

## 2022-03-30 DIAGNOSIS — I442 Atrioventricular block, complete: Secondary | ICD-10-CM | POA: Diagnosis present

## 2022-03-30 DIAGNOSIS — I132 Hypertensive heart and chronic kidney disease with heart failure and with stage 5 chronic kidney disease, or end stage renal disease: Secondary | ICD-10-CM | POA: Diagnosis present

## 2022-03-30 DIAGNOSIS — D696 Thrombocytopenia, unspecified: Secondary | ICD-10-CM | POA: Diagnosis present

## 2022-03-30 DIAGNOSIS — E1142 Type 2 diabetes mellitus with diabetic polyneuropathy: Secondary | ICD-10-CM | POA: Diagnosis present

## 2022-03-30 DIAGNOSIS — Z8249 Family history of ischemic heart disease and other diseases of the circulatory system: Secondary | ICD-10-CM

## 2022-03-30 DIAGNOSIS — I5023 Acute on chronic systolic (congestive) heart failure: Secondary | ICD-10-CM | POA: Diagnosis present

## 2022-03-30 DIAGNOSIS — Z992 Dependence on renal dialysis: Secondary | ICD-10-CM

## 2022-03-30 DIAGNOSIS — I350 Nonrheumatic aortic (valve) stenosis: Secondary | ICD-10-CM | POA: Diagnosis present

## 2022-03-30 DIAGNOSIS — G4733 Obstructive sleep apnea (adult) (pediatric): Secondary | ICD-10-CM | POA: Diagnosis present

## 2022-03-30 DIAGNOSIS — G253 Myoclonus: Secondary | ICD-10-CM | POA: Diagnosis present

## 2022-03-30 DIAGNOSIS — I255 Ischemic cardiomyopathy: Secondary | ICD-10-CM | POA: Diagnosis present

## 2022-03-30 DIAGNOSIS — R351 Nocturia: Secondary | ICD-10-CM | POA: Diagnosis present

## 2022-03-30 DIAGNOSIS — I959 Hypotension, unspecified: Secondary | ICD-10-CM | POA: Diagnosis present

## 2022-03-30 LAB — BASIC METABOLIC PANEL
Anion gap: 14 (ref 5–15)
BUN: 34 mg/dL — ABNORMAL HIGH (ref 8–23)
CO2: 29 mmol/L (ref 22–32)
Calcium: 9.5 mg/dL (ref 8.9–10.3)
Chloride: 97 mmol/L — ABNORMAL LOW (ref 98–111)
Creatinine, Ser: 8.66 mg/dL — ABNORMAL HIGH (ref 0.61–1.24)
GFR, Estimated: 5 mL/min — ABNORMAL LOW (ref >60)
Glucose, Bld: 152 mg/dL — ABNORMAL HIGH (ref 70–99)
Potassium: 5.1 mmol/L (ref 3.5–5.1)
Sodium: 140 mmol/L (ref 135–145)

## 2022-03-30 LAB — TROPONIN I (HIGH SENSITIVITY)
Troponin I (High Sensitivity): 54 ng/L — ABNORMAL HIGH (ref <18)
Troponin I (High Sensitivity): 57 ng/L — ABNORMAL HIGH (ref <18)

## 2022-03-30 LAB — CBC WITH DIFFERENTIAL/PLATELET
Abs Immature Granulocytes: 0.03 10*3/uL (ref 0.00–0.07)
Basophils Absolute: 0 10*3/uL (ref 0.0–0.1)
Basophils Relative: 1 %
Eosinophils Absolute: 0.2 10*3/uL (ref 0.0–0.5)
Eosinophils Relative: 2 %
HCT: 32.1 % — ABNORMAL LOW (ref 39.0–52.0)
Hemoglobin: 10.6 g/dL — ABNORMAL LOW (ref 13.0–17.0)
Immature Granulocytes: 0 %
Lymphocytes Relative: 28 %
Lymphs Abs: 2.2 10*3/uL (ref 0.7–4.0)
MCH: 34.8 pg — ABNORMAL HIGH (ref 26.0–34.0)
MCHC: 33 g/dL (ref 30.0–36.0)
MCV: 105.2 fL — ABNORMAL HIGH (ref 80.0–100.0)
Monocytes Absolute: 0.6 10*3/uL (ref 0.1–1.0)
Monocytes Relative: 8 %
Neutro Abs: 4.8 10*3/uL (ref 1.7–7.7)
Neutrophils Relative %: 61 %
Platelets: 126 10*3/uL — ABNORMAL LOW (ref 150–400)
RBC: 3.05 MIL/uL — ABNORMAL LOW (ref 4.22–5.81)
RDW: 14.9 % (ref 11.5–15.5)
WBC: 7.9 10*3/uL (ref 4.0–10.5)
nRBC: 0 % (ref 0.0–0.2)

## 2022-03-30 LAB — HEPATIC FUNCTION PANEL
ALT: 25 U/L (ref 0–44)
AST: 23 U/L (ref 15–41)
Albumin: 3.8 g/dL (ref 3.5–5.0)
Alkaline Phosphatase: 77 U/L (ref 38–126)
Bilirubin, Direct: 0.1 mg/dL (ref 0.0–0.2)
Indirect Bilirubin: 0.6 mg/dL (ref 0.3–0.9)
Total Bilirubin: 0.7 mg/dL (ref 0.3–1.2)
Total Protein: 6.9 g/dL (ref 6.5–8.1)

## 2022-03-30 LAB — MAGNESIUM: Magnesium: 2 mg/dL (ref 1.7–2.4)

## 2022-03-30 LAB — RETICULOCYTES
Immature Retic Fract: 13.6 % (ref 2.3–15.9)
RBC.: 2.78 MIL/uL — ABNORMAL LOW (ref 4.22–5.81)
Retic Count, Absolute: 52.8 K/uL (ref 19.0–186.0)
Retic Ct Pct: 1.9 % (ref 0.4–3.1)

## 2022-03-30 LAB — IRON AND TIBC
Iron: 63 ug/dL (ref 45–182)
Saturation Ratios: 27 % (ref 17.9–39.5)
TIBC: 238 ug/dL — ABNORMAL LOW (ref 250–450)
UIBC: 175 ug/dL

## 2022-03-30 LAB — CBG MONITORING, ED
Glucose-Capillary: 160 mg/dL — ABNORMAL HIGH (ref 70–99)
Glucose-Capillary: 92 mg/dL (ref 70–99)

## 2022-03-30 LAB — FOLATE: Folate: 25.4 ng/mL

## 2022-03-30 LAB — VITAMIN B12: Vitamin B-12: 1446 pg/mL — ABNORMAL HIGH (ref 180–914)

## 2022-03-30 LAB — FERRITIN: Ferritin: 1330 ng/mL — ABNORMAL HIGH (ref 24–336)

## 2022-03-30 MED ORDER — PANTOPRAZOLE SODIUM 40 MG PO TBEC
40.0000 mg | DELAYED_RELEASE_TABLET | Freq: Every day | ORAL | Status: DC
  Administered 2022-03-31 – 2022-04-02 (×3): 40 mg via ORAL
  Filled 2022-03-30 (×3): qty 1

## 2022-03-30 MED ORDER — ACETAMINOPHEN 650 MG RE SUPP: 650.0000 mg | Freq: Four times a day (QID) | RECTAL | Status: DC | PRN

## 2022-03-30 MED ORDER — NITROGLYCERIN 0.4 MG SL SUBL
0.4000 mg | SUBLINGUAL_TABLET | SUBLINGUAL | Status: DC | PRN
  Administered 2022-03-31 (×3): 0.4 mg via SUBLINGUAL
  Filled 2022-03-30: qty 1

## 2022-03-30 MED ORDER — HYDROCODONE-ACETAMINOPHEN 5-325 MG PO TABS: 1.0000 | ORAL_TABLET | ORAL | Status: DC | PRN

## 2022-03-30 MED ORDER — ACETAMINOPHEN 325 MG PO TABS: 650.0000 mg | ORAL_TABLET | Freq: Four times a day (QID) | ORAL | Status: DC | PRN

## 2022-03-30 MED ORDER — SODIUM CHLORIDE 0.9 % IV SOLN: 250.0000 mL | INTRAVENOUS | Status: DC | PRN

## 2022-03-30 MED ORDER — ASPIRIN 81 MG PO TBEC
81.0000 mg | DELAYED_RELEASE_TABLET | Freq: Every day | ORAL | Status: DC
  Administered 2022-03-31 – 2022-04-02 (×3): 81 mg via ORAL
  Filled 2022-03-30 (×3): qty 1

## 2022-03-30 MED ORDER — SODIUM CHLORIDE 0.9% FLUSH: 3.0000 mL | INTRAVENOUS | Status: DC | PRN

## 2022-03-30 MED ORDER — MIDODRINE HCL 5 MG PO TABS
5.0000 mg | ORAL_TABLET | ORAL | Status: DC
  Administered 2022-03-31: 10 mg via ORAL
  Filled 2022-03-30: qty 2

## 2022-03-30 MED ORDER — GABAPENTIN 300 MG PO CAPS
300.0000 mg | ORAL_CAPSULE | Freq: Two times a day (BID) | ORAL | Status: DC
  Administered 2022-03-30 – 2022-04-01 (×4): 300 mg via ORAL
  Filled 2022-03-30 (×5): qty 1

## 2022-03-30 MED ORDER — ATORVASTATIN CALCIUM 80 MG PO TABS
80.0000 mg | ORAL_TABLET | Freq: Every day | ORAL | Status: DC
  Administered 2022-03-31 – 2022-04-02 (×3): 80 mg via ORAL
  Filled 2022-03-30 (×3): qty 1

## 2022-03-30 MED ORDER — CLOPIDOGREL BISULFATE 75 MG PO TABS
75.0000 mg | ORAL_TABLET | Freq: Every day | ORAL | Status: DC
  Administered 2022-03-31 – 2022-04-02 (×3): 75 mg via ORAL
  Filled 2022-03-30 (×3): qty 1

## 2022-03-30 MED ORDER — ALBUTEROL SULFATE (2.5 MG/3ML) 0.083% IN NEBU: 2.5000 mg | INHALATION_SOLUTION | RESPIRATORY_TRACT | Status: DC | PRN

## 2022-03-30 MED ORDER — SODIUM CHLORIDE 0.9% FLUSH
3.0000 mL | Freq: Two times a day (BID) | INTRAVENOUS | Status: DC
  Administered 2022-03-30 – 2022-04-02 (×2): 3 mL via INTRAVENOUS

## 2022-03-30 MED ORDER — FENTANYL CITRATE PF 50 MCG/ML IJ SOSY
50.0000 ug | PREFILLED_SYRINGE | Freq: Once | INTRAMUSCULAR | Status: DC
  Filled 2022-03-30: qty 1

## 2022-03-30 MED ORDER — INSULIN ASPART 100 UNIT/ML IJ SOLN
0.0000 [IU] | INTRAMUSCULAR | Status: DC
  Administered 2022-03-30 – 2022-04-02 (×4): 1 [IU] via SUBCUTANEOUS

## 2022-03-30 MED ORDER — SEVELAMER CARBONATE 800 MG PO TABS: 1600.0000 mg | ORAL_TABLET | ORAL | Status: DC

## 2022-03-30 NOTE — Consult Note (Addendum)
Cardiology Consultation   Patient ID: Rodney Farley MRN: [id number]; DOB: Oct 09, 1931  Admit date: 03/30/2022 Date of Consult: 03/30/2022  PCP:  [name], MD   [facility] Cardiologist:  Durrel [name], MD   {    Patient Profile:   Rodney Farley is a 88 y.o. male with a PMH of CAD with multiple stents and intervention, ischemic cardiomyopathy, chronic systolic heart failure with recovering EF, moderate aortic stenosis,  complete heart block s/p ICD-CRT, atrial fibrillation, HLD, hypotension, type 2 DM, ESRD on HD, OSA, COPD, TIA, BPH, who is being seen 03/30/2022 for the evaluation of chest pain at the request of Margarita Mail PA-C.   History of Present Illness:    Rodney Farley is a 88 y.o. male with a history of CAD with remote MI in 63 while in [place], PCIs to LAD/ 2nd Diag/ OM3 in 2016, and recent NSTEMI on 11/11/2021 s/p DES to ostial to LAD; ischemic cardiomyopathy/ chronic systolic CHF with EF as low as 25-30% in 2016 with subsequent normalization and then EF of 45-50% on most recent Echo on 11/11/2021; complete heart block s/p PPM which was upgraded to ICD-CRT in 2017 (gen change in 09/2021); subclinical atrial fibrillation noted on device in 2021 not on anticoagulation, moderate aortic stenosis, hypertension; hyperlipidemia; type 2 diabetes mellitus; ESRD on hemodialysis; obstructive sleep apnea; COPD; TIA; and BPH who is followed by Dr. [name] and Dr. Caryl Comes and presents today to ER with c/o of chest pain. He has chronic angina and felt pain is progressively worsened,  having chest pain at rest and with exertion now. He was getting dialysis, had significant body shaking episodes, had out of his body sensation. He states his chest pain is similar to previous MI, but not as intense like before, only mild aching.  He said he does not normally have body shaking with his chest pain. He had taken Nitro since July 2023 cath for angina and it's  working. He wants to know what he can do to prevent his chest pain.   Historically per chart review:   Patient was recently admitted from 11/11/2021 to 11/13/2021 for NSTEMI. High-sensitivity troponin peaked at 4,677. Echo showed LVEF of 45-50% with akinesis of the apical septal segment, apical inferior segment, and apex as well as grade 1 diastolic dysfunction and moderate aortic stenosis. LHC on 11/12/2021 90% stenosis of ostial to proximal LAD, 90% stenosis of ostial 1st Diag, and 70% stenosis of ostial to proximal RCA, and then mild disease in OM3, 2nd Diag, and LPAV. He underwent successful PCI with DES to ostial to proximal LAD lesion with a 3.0 x 24 mm Synergy stent. This was post dilated to 3.5 mm. This was proximal to the old stent.He was started on DAPT with Aspirin and Plavix and home Lipitor was increased.  He was readmitted from 12/23/2021 to 12/25/2021 for NSTEMI after presenting with chest pain that radiated to bilateral arms with associated nausea and vomiting. Pain improved with Nitro. High-sensitivity troponin 223 >> 653 >> 1,073. Repeat LHC which showed 90% in-stent restenosis of distal left main to ostial LAD with thrombus and  stent underexpansion at the ostium with OCT. Patient underwent difficult but successful PCI with PCTA of this lesion. Dilated to high pressures with a 4.0 mm [place] balloon.  He was continued on DAPT but Coreg was stopped due to low BP. He had continued angina the following day, felt it's occurring daily and relieved typically after 20 minutes with  one sl Ntg. It awakens him from sleep. He still has issues with low BP and has to take midodrine on dialysis days.It was felt there is no good option for anti-anginal therapy due to hypotension and ESRD. To evaluate the contribution of his aortic stenosis he had a calcium score of the aortic valve with a score of 1304. This was not consistent with severe AS.   He was readmitted with NSTEMI on 02/08/22. Peak troponin 498. Repeat  cardiac cath showed restenosis of the proximal LAD stent. Intravascular imaging again showed the stent was not fully expanded due to calcification. Intracoronary lithotripsy was performed with aggressive dilation with a 4.0 [place] balloon to 22 atm. Additional stenting was not done due to inability to expand fully. His other disease was not changed significantly.   He was last seen by cardiology Dr [name] in the office 02/17/22, feeling much improved in angina. He was walking in his apartment corridor for 5 minutes at a time. Dyspnea was also improved.     Past Medical History:  Diagnosis Date   AICD (automatic cardioverter/defibrillator) present    [employer]tr[employer] pacer   Anemia    Anxiety and depression    Arthritis    Bell's palsy    left side. after shingles episode   BPH associated with nocturia    Chronic systolic CHF (congestive heart failure) ([facility])    EF normalized by Echo 2019   COPD (chronic obstructive pulmonary disease) ([facility])    Severe   Coronary artery disease    a. s/p MI in 1994/1995 while in [place] s/p questionable PCI. 03/2015: progression of disease, for staged PCI.   Diabetic peripheral neuropathy (HCC)    GERD (gastroesophageal reflux disease)    Gout    Hard of hearing    B/L   History of chronic pancreatitis 07/23/2017   noted on CT abd/pelvis   History of shingles    Hypercholesterolemia    Hypertension    Obesity    Sleep apnea    "sleeps w/humidifyer when he panics and gets short of breath" (04/08/2015)   TIA (transient ischemic attack) X 3   Trigger middle finger of left hand    Type II diabetes mellitus ([place])    Wears glasses    Wears hearing aid     Past Surgical History:  Procedure Laterality Date   APPENDECTOMY     AV FISTULA PLACEMENT Right 02/07/2019   Procedure: ARTERIOVENOUS (AV) FISTULA CREATION RIGHT UPPER ARM;  Surgeon: Waynetta Sandy, MD;  Location: [facility];  Service: Vascular;  Laterality: Right;   BIV ICD GENERATOR CHANGEOUT N/A  10/09/2021   Procedure: BIV ICD GENERATOR CHANGEOUT;  Surgeon: Deboraha Sprang, MD;  Location: [facility];  Service: Cardiovascular;  Laterality: N/A;   CARDIAC CATHETERIZATION N/A 03/29/2015   Procedure: Right/Left Heart Cath and Coronary Angiography;  Surgeon: Larwence M [name], MD;  Location: [facility];  Service: Cardiovascular;  Laterality: N/A;   [place]   "after my MI; put me on heart RX after cath"   CARDIAC CATHETERIZATION N/A 04/09/2015   Procedure: Coronary Stent Intervention;  Surgeon: Normand M [name], MD;  Location: [facility];  Service: Cardiovascular;  Laterality: N/A;   CATARACT EXTRACTION W/ INTRAOCULAR LENS  IMPLANT, BILATERAL     CHOLECYSTECTOMY N/A 10/13/2017   Procedure: LAPAROSCOPIC CHOLECYSTECTOMY WITH LYSIS OF ADHESIONS;  Surgeon: Ileana Roup, MD;  Location: [facility];  Service: General;  Laterality: N/A;  COLONOSCOPY     CORONARY ANGIOGRAPHY N/A 11/12/2021   Procedure: CORONARY ANGIOGRAPHY;  Surgeon: Wellington Hampshire, MD;  Location: [facility];  Service: Cardiovascular;  Laterality: N/A;   CORONARY ANGIOGRAPHY N/A 12/24/2021   Procedure: CORONARY ANGIOGRAPHY;  Surgeon: Troy Sine, MD;  Location: [facility];  Service: Cardiovascular;  Laterality: N/A;   CORONARY BALLOON ANGIOPLASTY N/A 12/24/2021   Procedure: CORONARY BALLOON ANGIOPLASTY;  Surgeon: Troy Sine, MD;  Location: [facility];  Service: Cardiovascular;  Laterality: N/A;   CORONARY BALLOON ANGIOPLASTY N/A 02/09/2022   Procedure: CORONARY BALLOON ANGIOPLASTY;  Surgeon: Nelva Bush, MD;  Location: [facility];  Service: Cardiovascular;  Laterality: N/A;  LAD   CORONARY STENT INTERVENTION N/A 11/12/2021   Procedure: CORONARY STENT INTERVENTION;  Surgeon: Wellington Hampshire, MD;  Location: [facility];  Service: Cardiovascular;  Laterality: N/A;  LAD   DENTAL SURGERY     EP IMPLANTABLE DEVICE N/A 09/23/2015   MDT CRT-D, Dr.  Caryl Comes   HIATAL HERNIA REPAIR  1977   ILEOCECETOMY N/A 03/27/2017   Procedure: ILEOCECECTOMY;  Surgeon: Ileana Roup, MD;  Location: [facility];  Service: General;  Laterality: N/A;   INSERT / REPLACE / REMOVE PACEMAKER  07/2008   Complete heart block status post DDD with good function   INTRAVASCULAR IMAGING/OCT N/A 12/24/2021   Procedure: INTRAVASCULAR IMAGING/OCT;  Surgeon: Troy Sine, MD;  Location: [facility];  Service: Cardiovascular;  Laterality: N/A;   INTRAVASCULAR LITHOTRIPSY  02/09/2022   Procedure: INTRAVASCULAR LITHOTRIPSY;  Surgeon: Nelva Bush, MD;  Location: [facility];  Service: Cardiovascular;;  LAD   INTRAVASCULAR ULTRASOUND/IVUS N/A 02/09/2022   Procedure: Intravascular Ultrasound/IVUS;  Surgeon: Nelva Bush, MD;  Location: [facility];  Service: Cardiovascular;  Laterality: N/A;  LAD   IR FLUORO GUIDE CV LINE RIGHT  04/03/2019   IR US GUIDE VASC ACCESS RIGHT  04/03/2019   LAPAROTOMY N/A 03/27/2017   Procedure: EXPLORATORY LAPAROTOMY;  Surgeon: Ileana Roup, MD;  Location: [facility];  Service: General;  Laterality: N/A;   RIGHT HEART CATH AND CORONARY ANGIOGRAPHY N/A 02/09/2022   Procedure: RIGHT HEART CATH AND CORONARY ANGIOGRAPHY;  Surgeon: Nelva Bush, MD;  Location: [facility];  Service: Cardiovascular;  Laterality: N/A;   TONSILLECTOMY     UPPER GI ENDOSCOPY       Home Medications:  Prior to Admission medications   Medication Sig Start Date End Date Taking? Authorizing Provider  aspirin EC 81 MG tablet Take 81 mg by mouth daily.    [provider]  atorvastatin (LIPITOR) 80 MG tablet Take 1 tablet by mouth daily. 11/14/21   Margie Billet, PA-C  calcitRIOL (ROCALTROL) 0.25 MCG capsule Take 7 capsules (1.75 mcg total) by mouth every Monday, Wednesday, and Friday with hemodialysis. 01/26/22   [name], Ichael M, MD  carbamide peroxide (DEBROX) 6.5 % OTIC solution Place 5 drops into both ears 2 (two) times daily  as needed (earwax). 11/13/21   Lelon Perla, MD  Carboxymethylcellul-Glycerin (LUBRICATING EYE DROPS OP) Place 1 drop into both eyes 3 (three) times daily as needed (dry eyes).    [provider]  clopidogrel (PLAVIX) 75 MG tablet Take 1 tablet (75 mg total) by mouth daily with breakfast. 11/14/21   Margie Billet, PA-C  gabapentin (NEURONTIN) 300 MG capsule Take 1 capsule (300 mg total) by mouth 2 (two) times daily. 03/16/22   Lelon Perla,  MD  glucose blood (FREESTYLE TEST STRIPS) test strip Use to check blood sugar daily 05/01/19   [name], MD  glucose monitoring kit (FREESTYLE) monitoring kit USE TO MONITOR BLOOD GLUCOSE AS DIRECTED 05/01/19   [name], MD  Methoxy PEG-Epoetin Beta (MIRCERA IJ) as directed. 02/26/21 07/15/22  [provider]  midodrine (PROAMATINE) 10 MG tablet Take 1 tablet (10 mg) three times a week. Add 5 mg a night after dialysis as needed for low blood pressure. Patient taking differently: Take 5-10 mg by mouth See admin instructions. Take 1 tablet (10 mg) three times a week. Add 5 mg a night after dialysis as needed for low blood pressure. 12/03/21   Sande Rives E, PA-C  multivitamin (RENA-VIT) TABS tablet Take 1 tablet by mouth daily. 08/22/21   [name], MD  nitroGLYCERIN (NITROSTAT) 0.4 MG SL tablet Place 1 tablet under the tongue every 5 minutes x 3 doses as needed for chest pain. 11/13/21   Margie Billet, PA-C  oxymetazoline (AFRIN) 0.05 % nasal spray Place 1 spray into both nostrils 2 (two) times daily as needed for congestion. 12/29/21   [name], MD  pantoprazole (PROTONIX) 40 MG tablet Take 1 tablet by mouth daily. 11/14/21   Margie Billet, PA-C  saccharomyces boulardii (FLORASTOR) 250 MG capsule Take 1 capsule (250 mg total) by mouth 2 (two) times daily. 04/08/19   Regalado, Belkys A, MD  sevelamer carbonate (RENVELA) 800 MG tablet Take 2 tablets (1,600 mg total) by mouth 3 (three) times daily  with meals. Patient taking differently: Take 1,600 mg by mouth See admin instructions. Take 2 tablets by mouth three times daily with meals and 1 tablet with snacks 11/13/21   Lelon Perla, MD  vitamin B-12 (CYANOCOBALAMIN) 1000 MCG tablet Take 1,000 mcg by mouth daily.    [provider]    Inpatient Medications: Scheduled Meds:  fentaNYL (SUBLIMAZE) injection  50 mcg Intravenous Once   Continuous Infusions:  PRN Meds: nitroGLYCERIN  Allergies:    Allergies  Allergen Reactions   Bee Venom Anaphylaxis   Lyrica [Pregabalin] Other (See Comments)    hallucinations   Prednisone Other (See Comments)    hallucinations   Zocor [Simvastatin] Nausea Only and Other (See Comments)    Headache with brand name only.  Can take the generic.    Social History:   Social History   Socioeconomic History   Marital status: Legally Separated    Spouse name: Not on file   Number of children: 1   Years of education: college   Highest education level: Not on file  Occupational History   Occupation: Retired  Tobacco Use   Smoking status: Former    Packs/day: 1.50    Years: 54.00    Total pack years: 81.00    Types: Cigarettes    Quit date: 07/18/2007    Years since quitting: 14.7    Passive exposure: Never   Smokeless tobacco: Never  Vaping Use   Vaping Use: Never used  Substance and Sexual Activity   Alcohol use: Yes    Comment: rare   Drug use: No   Sexual activity: Not Currently  Other Topics Concern   Not on file  Social History Narrative   Married 1994 (together since 1989) 1 son, 1 stepson. 3 grandkids, 6 great grandkids      Retired from [employer]. Had 2 years of collge.       Faith: Mormon  Here on [employer] since 2004 from [place];    Social Determinants of Health   Financial Resource Strain: Not on file  Food Insecurity: Not on file  Transportation Needs: Not on file  Physical Activity: Not on file  Stress: Not on file  Social  Connections: Not on file  Intimate Partner Violence: Not on file    Family History:    Family History  Problem Relation Age of Onset   Stroke Mother    Leukemia Father    Stroke Sister    Heart attack Brother      ROS:   Constitutional: Denied fever, chills, malaise, night sweats Eyes: Denied vision change or loss Ears/Nose/Mouth/Throat: Denied ear ache, sore throat, coughing, sinus pain Cardiovascular: see HPI  Respiratory: see HPI  Gastrointestinal: Denied nausea, vomiting, abdominal pain, diarrhea Genital/Urinary: Denied dysuria, hematuria, urinary frequency/urgency Musculoskeletal: Denied muscle ache, joint pain, weakness Skin: Denied rash, wound Neuro: suspected seizure  Psych: Denied history of depression/anxiety  Endocrine: history of diabetes   Physical Exam/Data:   Vitals:   03/30/22 1407 03/30/22 1412 03/30/22 1428 03/30/22 1615  BP: (!) 107/57  (!) 140/81 122/78  Pulse: 79  80 81  Resp: 17  17 (!) 22  Temp: 98.6 F (37 C)  98.6 F (37 C)   TempSrc: Oral  Oral   SpO2: 94%  99% 98%  Weight:  98.4 kg    Height:  _0  (1.702 m)     No intake or output data in the 24 hours ending 03/30/22 1718    03/30/2022    2:12 PM 03/19/2022    1:55 PM 02/17/2022    8:11 AM  Last 3 Weights  Weight (lbs) 217 lb 217 lb 9.5 oz 214 lb  Weight (kg) 98.431 kg 98.7 kg 97.07 kg     Body mass index is 33.99 kg/m.   Vitals:  Vitals:   03/30/22 1615 03/30/22 1745  BP: 122/78 128/65  Pulse: 81 80  Resp: (!) 22 20  Temp:    SpO2: 98% 97%   General Appearance: In no apparent distress, laying in bed HEENT: Normocephalic, atraumatic.  Neck: Supple, trachea midline, no JVDs Cardiovascular: Regular rate and rhythm, normal Q7-R9, systolic murmur grade III LUSB Respiratory: Resting breathing unlabored, lungs sounds clear to auscultation bilaterally, no use of accessory muscles. On room air.  No wheezes, rales or rhonchi.   Gastrointestinal: Bowel sounds positive, abdomen soft,  non-tender  Extremities: Able to move all extremities in bed without difficulty, no edema Musculoskeletal: Normal muscle bulk and tone Skin: Intact, warm, dry. No rashes or petechiae noted in exposed areas.  Neurologic: Alert, oriented to person, place and time. Fluent speech, no facial droop, no cognitive deficit Psychiatric: Normal affect. Mood is appropriate.  Right arm with fistula + thrill and bruit     EKG:  The EKG was personally reviewed and demonstrates:  Paced rhythm  Telemetry:  Telemetry was personally reviewed and demonstrates:  AV paced rhythm   Relevant CV Studies:  L and R HC on 02/09/22:  Conclusions: Significant single-vessel coronary artery disease with 90% in-stent restenosis (minimal luminal area 2.9 mm) at site of stent placement in 11/2021 and repeat angioplasty in 12/2021. Moderate LCx disease that appears stable from prior catheterizations. Stable 70% proximal stenosis of nondominant RCA Patent D2 stent with 30% stenosis at the ostium of D2 before the stent.  Reported OM stent is not clearly visualized. Moderately to severely elevated left and right heart filling pressures (PCWP  33 mmHg, mean RA 14 mmHg). Normal to supranormal cardiac output, likely accentuated by dialysis fistula. Successful PTCA with intracoronary lithotripsy of the ostial/proximal LAD in-stent restenosis with reduction in stenosis from 90% to 20% and improvement in flow from TIMI-2 to TIMI-3.  Additional stent placement at the ostium of the LAD was not pursued given inability to completely expand the vessel despite lithotripsy and high-pressure angioplasty.   Recommendations: Remove right femoral artery and venous sheaths after ACT has fallen below 175 seconds with manual compression. Continue indefinite dual antiplatelet therapy with aspirin and clopidogrel. Aggressive secondary prevention of coronary artery disease. Proceed with hemodialysis later today, if possible, given elevated filling  pressures. Limited echo to reassess LVEF and aortic stenosis.   RA (mean): 14 mmHg RV (S/EDP): 57/15 mmHg PA (S/D, mean): 57/30 (39) mmHg PCWP (mean): 33 mmHg  Ao sat: 92% PA sat: 72%  Fick CO: 10.6 L/min Fick CI: 5.0 L/min/m^2  Thermodilution CO: 6.5 L/min Thermodilution CI: 3.1 L/min/m^2    Intervention     Echo from 11/11/21:   1. Left ventricular ejection fraction, by estimation, is 45 to 50%. The  left ventricle has mildly decreased function. The left ventricle  demonstrates regional wall motion abnormalities (see scoring  diagram/findings for description). The left ventricular   internal cavity size was mildly dilated. There is mild left ventricular  hypertrophy. Left ventricular diastolic parameters are consistent with  Grade I diastolic dysfunction (impaired relaxation).   2. Right ventricular systolic function is normal. The right ventricular  size is normal. Tricuspid regurgitation signal is inadequate for assessing  PA pressure.   3. Left atrial size was moderately dilated.   4. The mitral valve is degenerative. Trivial mitral valve regurgitation.  No evidence of mitral stenosis.   5. The aortic valve was not well visualized. Aortic valve regurgitation  is not visualized. Moderate aortic valve stenosis. Vmax 2.9 m/s, MG  89mHg, AVA 1.2 cm^2, DI 0.38     Laboratory Data:  High Sensitivity Troponin:   Recent Labs  Lab 03/30/22 1438  TROPONINIHS 54*     Chemistry Recent Labs  Lab 03/30/22 1438  NA 140  K 5.1  CL 97*  CO2 29  GLUCOSE 152*  BUN 34*  CREATININE 8.66*  CALCIUM 9.5  MG 2.0  GFRNONAA 5*  ANIONGAP 14    Recent Labs  Lab 03/30/22 1438  PROT 6.9  ALBUMIN 3.8  AST 23  ALT 25  ALKPHOS 77  BILITOT 0.7   Lipids No results for input(s): "CHOL", "TRIG", "HDL", "LABVLDL", "LDLCALC", "CHOLHDL" in the last 168 hours.  Hematology Recent Labs  Lab 03/30/22 1438  WBC 7.9  RBC 3.05*  HGB 10.6*  HCT 32.1*  MCV 105.2*  MCH 34.8*   MCHC 33.0  RDW 14.9  PLT 126*   Thyroid No results for input(s): "TSH", "FREET4" in the last 168 hours.  BNPNo results for input(s): "BNP", "PROBNP" in the last 168 hours.  DDimer No results for input(s): "DDIMER" in the last 168 hours.   Radiology/Studies:  DG Chest Port 1 View  Result Date: 03/30/2022 CLINICAL DATA:  Chest pain during hemodialysis. EXAM: PORTABLE CHEST 1 VIEW COMPARISON:  Radiographs 02/08/2022 and 01/21/2022.  CT 02/03/2022. FINDINGS: 1624 hours. Multiple left subclavian AICD leads are grossly stable. The heart is enlarged. There is aortic atherosclerosis and diffuse pulmonary interstitial prominence, similar to the most recent study. No confluent airspace opacity, pneumothorax or significant pleural effusion. The bones appear unremarkable. Several telemetry leads overlie the chest.  IMPRESSION: Stable cardiomegaly and diffuse interstitial pulmonary prominence which may reflect mild edema. Electronically Signed   By: Richardean Sale M.D.   On: 03/30/2022 16:43   CT Head Wo Contrast  Result Date: 03/30/2022 CLINICAL DATA:  New onset seizure, altered level of consciousness EXAM: CT HEAD WITHOUT CONTRAST TECHNIQUE: Contiguous axial images were obtained from the base of the skull through the vertex without intravenous contrast. RADIATION DOSE REDUCTION: This exam was performed according to the departmental dose-optimization program which includes automated exposure control, adjustment of the mA and/or kV according to patient size and/or use of iterative reconstruction technique. COMPARISON:  04/01/2019 FINDINGS: Brain: Stable hypodensities throughout the periventricular white matter and basal ganglia, consistent with chronic small vessel ischemic change. No signs of acute infarct or hemorrhage. The lateral ventricles and remaining midline structures are unremarkable. No acute extra-axial fluid collections. No mass effect. Vascular: Stable atherosclerosis.  No hyperdense vessel.  Skull: Normal. Negative for fracture or focal lesion. Sinuses/Orbits: Stable postsurgical changes bilateral globes. Otherwise the orbits and paranasal sinuses are unremarkable. Other: None. IMPRESSION: 1. Stable head CT, no acute intracranial process. Electronically Signed   By: Randa Ngo M.D.   On: 03/30/2022 16:14     Assessment and Plan:   CAD with hx of multiple stents and intervention - most recently had cardiac ath 02/09/22 with PTCA with intracoronary lithotripsy of the ostial/proximal LAD in-stent restenosis with reduction in stenosis from 90% to 20% - presented with chest  pain not improving with Nitro, worse with exertion, and SOB - Hs trop  54, trend to peak  - EKG not diagnostics  - Will need repeat cath given recurrent ISR over May/June/July 2023  - continue DAPT with ASA, Plavix, statin - no anti-anginal therapy option due to hypotension  - NPO after midnight, LHC tomorrow  - No heparin gtt for now  Ischemic Cardiomyopathy Chronic Systolic CHF Echo in 12/5033 showed LVEF 45-50% with akinesis of the apical septal segment, apical inferior segment, and apex as well as grade 1 diastolic dysfunction. - Euvolemic on exam.  - Volume status is being managed by hemodialysis.  - Previously on Coreg but this was stopped due to hypotension.  - No ACEi/ARB due to renal function.   Moderate Aortic Stenosis Noted on Echo during recent admission. -by recent CT calcium score of the AV is not in the severe range.   - patient's AS is not severe enough to consider for TAVR.    Complete Heart Block S/p ICD-CRT with recent gen change in 09/2021. - Followed by Dr. Caryl Comes.   Subclinical Atrial Fibrillation History of atrial fibrillation noted on prior device check in 2021. Not on anticoagulation due to low atrial fibrillation burden. No atrial fibrillation noted on device interrogations since 06/2020.    Hypotension Previously had hypertension but now struggles with hypotension  - BP is  WNL today - on no BP lowering medication. - Takes Midodrine 45m on dialysis days. OK to take additional 573mon non-dialysis days if needed.   Hyperlipidemia - Continue Lipitor 8019maily.  - last LDL 79 in June   Suspected seizure  Type 2 Diabetes Mellitus ESRD - per primary team    Risk Assessment/Risk Scores:  {   For questions or updates, please contact [facility] consult [email or web address] for contact info under    Signed, XikMargie BilletP  03/30/2022 5:18 PM  I have seen and examined the patient along with XikMargie BilletP .  I have reviewed the  chart, notes and new data.  I agree with PA/NP's note.  Key new complaints: He has continued to have exertional angina ever since his last procedure in the late July, but for the most part this has had a predictable pattern, associated with relatively light exertion ("I failed miserably in cardiac rehab"; "simply getting onto the CT procedure table made me have angina"), but recently he has had episodes of similar chest discomfort that have occurred unpredictably at rest.  Each time he has taken nitroglycerin effectively with relief of symptoms.  Today he has had to take nitroglycerin 3 times.  It is the first time that the angina actually bothered him during dialysis.  Also during dialysis he had an episode of unusual head to toe shaking and a feeling of dissociation that has raise concern for an atypical seizure. Key examination changes: Regular rate and rhythm, paradoxically split S2, 3/6 aortic ejection murmur is mid peaking, there is a pronounced continuous diastolic murmur that is heard broadly throughout the precordium and seems to be loudest towards the right upper sternal border, even louder over the right shoulder and is probably radiating from his right upper arm arteriovenous fistula. Key new findings / data: As opposed to previous hospitalizations with non-STEMI, at this time his cardiac enzymes are marginally abnormal.   This correlates well with the fact that the angina events have been less intense and more brief.  ECG is nondiagnostic due to pacing.  ECG shows a distinct positive R wave in lead V1 consistent with effective left ventricular pacing.  PLAN: Unfortunately he continues to have episodes that are highly compatible with in-stent restenosis, likely due to underexpanded LAD stent due to heavily calcified plaque.  Symptoms have returned even though at his last procedure he had aggressive IVUS guided balloon inflation.  Thankfully at this time he does not have enzyme evidence of myocardial necrosis.  Antianginal medical therapy is severely limited by hypotension (requiring midodrine) and inability to use ranolazine due to ESRD.  Due to his age and comorbidities he is also not an optimal candidate for surgical revascularization. Not an easy situation.  Plan repeat coronary angiography in the morning.  May have to have consultation regarding best method to approach the situation with multiple members of our interventional cardiology team. Since he really does not have evidence of myocardial necrosis by enzymes, we will hold off intravenous heparin at this time, restart if he develops more chest pain tonight.  Sanda Klein, MD, [place] [phone] 03/30/2022, 6:25 PM

## 2022-03-30 NOTE — Assessment & Plan Note (Signed)
Chronic stable Continue Lipitor 80 mg p.o. daily

## 2022-03-30 NOTE — Subjective & Objective (Signed)
Family called because patient is having more frequent episodes of chest pain and has been using nitroglycerin almost on a regular basis.  Chest pain radiating to left arm patient is followed by Dr. [name] cardiology.  Called cardiology and was told to go to emergency department.  Patient had his end-stage renal disease on dialysis  Had a total of 3 episodes today and taking nitro.  While at dialysis tonight had started to have tremors Since that nitro no longer resume believes his pain Patient reports that he has been conscious the whole time while he was having a tremor but had difficulty responding.  No postictal state no confusion no neurological deficits.  Patient has chronic lower extremity pain secondary to neuropathy. Last time had to be admission of NSTEMI in July 2023 at that time troponin peaked at 498.  Had a cardiac cath done that showed restenosis of proximal LAD stent he had balloon dilatation done  Patient is status post AICD pacemaker

## 2022-03-30 NOTE — Assessment & Plan Note (Signed)
Appreciate cardiology consult Continue double therapy of aspirin and Plavix.  N.p.o. postmidnight for cardiac catheterization in a.m.

## 2022-03-30 NOTE — Assessment & Plan Note (Signed)
Order sliding scale sensitive patient not on p.o. medications at home not on insulin

## 2022-03-30 NOTE — Assessment & Plan Note (Signed)
In the setting of fluid overload patient has not been able to finish his dialysis plan for hemodialysis in the morning.  [name] cardiology and nephrology involvement.

## 2022-03-30 NOTE — Assessment & Plan Note (Addendum)
Continue midodrine 10 mg p.o. 3 times a day

## 2022-03-30 NOTE — Assessment & Plan Note (Signed)
Appreciate nephrology input continue to monitor potassium level

## 2022-03-30 NOTE — Telephone Encounter (Signed)
Spoke with son who reports patient having more frequent episodes of chest pain and NTG use (daily). Chest pain recently going down left arm. Has not felt AICD fire. Son wants appointment with Dr. [name]. Recommended patient be taken to the ED. Patient currently at dialysis.

## 2022-03-30 NOTE — H&P (Addendum)
Benen Weida HGD:[id number] DOB: 06-11-1932 DOA: 03/30/2022     PCP: [name], MD   Outpatient Specialists:  CARDS: Dr.Jordan NEphrology:  [facility]    Patient arrived to ER on 03/30/22 at 48 Referred by Attending Ottie Glazier, DO   Patient coming from:    home Lives alone,        Chief Complaint:   Chief Complaint  Patient presents with   Chest Pain    HPI: Hadden Steig is a 88 y.o. male with medical history significant of  CAD last cardiac catheterization in July 9622, chronic systolic CHF, heart block status post ICD, A-fib, HLD, DM2, end-stage renal disease on hemodialysis Monday Wednesday Friday, OSA, COPD, TIA, BPH,     Presented with   recurrent chest pain and tremor while in dialysis Family called because patient is having more frequent episodes of chest pain and has been using nitroglycerin almost on a regular basis.  Chest pain radiating to left arm patient is followed by Dr. [name] cardiology.  Called cardiology and was told to go to emergency department.  Patient had his end-stage renal disease on dialysis  Had a total of 3 episodes today and taking nitro.  While at dialysis tonight had started to have tremors Since that nitro no longer resume believes his pain Patient reports that he has been conscious the whole time while he was having a tremor but had difficulty responding.  No postictal state no confusion no neurological deficits.  Patient has chronic lower extremity pain secondary to neuropathy. Last time had to be admission of NSTEMI in July 2023 at that time troponin peaked at 498.  Had a cardiac cath done that showed restenosis of proximal LAD stent he had balloon dilatation done  Patient is status post AICD pacemaker  Pt has been having this episodes of shaking on the right and difficulty speaking He also gets Cp with these episodes  His other episodes of CP were unprovoked  States he could not complete HD     Regarding pertinent Chronic problems:     Hyperlipidemia -  on statins Lipitor (atorvastatin)  Lipid Panel     Component Value Date/Time   CHOL 153 01/06/2022 1228   TRIG 197 (H) 01/06/2022 1228   HDL 41 01/06/2022 1228   CHOLHDL 3.7 01/06/2022 1228   CHOLHDL 4.2 11/12/2021 0554   VLDL 47 (H) 11/12/2021 0554   LDLCALC 79 01/06/2022 1228   LDLDIRECT 60.0 04/13/2018 0926   LABVLDL 33 01/06/2022 1228    On midodrine to support blood pressure    chronic CHF diastolic/systolic/ combined - last echo may 2023  EF 45-50% The left ventricle  demonstrates regional wall motion abnormalities (see scoring  diagram/findings for description). The left ventricular   internal cavity size was mildly dilated. There is mild left ventricular  hypertrophy. Left ventricular diastolic parameters are consistent with  Grade I diastolic dysfunction (impaired relaxation).     CAD  - On Aspirin, statin,  Plavix                 -   followed by cardiology                - last cardiac cath 01/2022 The ASCVD Risk score (Arnett DK, et al., 2019) failed to calculate for the following reasons:   The 2019 ASCVD risk score is only valid for ages 10 to 84   The patient has a prior MI or stroke diagnosis  DM 2 -  Lab Results  Component Value Date   HGBA1C 7.5 (H) 11/11/2021    diet controlled     Morbid obesity-   BMI Readings from Last 1 Encounters:  03/30/22 33.99 kg/m        COPD - not  followed by pulmonology    not  on baseline oxygen     OSA - not on CPAP      A. Fib -  - CHA2DS2 vas score    6    Not on anticoagulation secondary to Risk of Falls,   recurrent bleeding          ESRD on HD M W F Estimated Creatinine Clearance: 6.6 mL/min (A) (by C-G formula based on SCr of 8.66 mg/dL (H)).  Lab Results  Component Value Date   CREATININE 8.66 (H) 03/30/2022   CREATININE 5.58 (H) 02/10/2022   CREATININE 8.91 (H) 02/09/2022     Chronic anemia - baseline hg Hemoglobin & Hematocrit   Recent Labs    02/09/22 0939 02/10/22 0433 03/30/22 1438  HGB 10.5* 10.0* 10.6*     While in ER: Clinical Course as of 03/30/22 1936  Mon Mar 30, 2022  1721 I spoke with cardiology (NP Maylon Peppers) - cardiology will consult on the patient. [AH]  1725 I spoke with Dr. Marval Regal who will set up pt for dialysis  [AH]  1803 Case discussed with Dr. Cheral Marker of Neurology- will evaluate the patient and has ordered an EEG  [AH]    Clinical Course User Index [AH] Margarita Mail, PA-C      Ordered  CT HEAD   NON acute  CXR -Stable cardiomegaly and diffuse interstitial pulmonary prominence which may reflect mild edema.   Following Medications were ordered in ER: Medications  fentaNYL (SUBLIMAZE) injection 50 mcg (has no administration in time range)  nitroGLYCERIN (NITROSTAT) SL tablet 0.4 mg (has no administration in time range)    _______________________________________________________ ER Provider Called:   Cardiology   Dr. Sallyanne Kuster They Recommend admit to medicine    SEEN in ER plan for cardiac cath in AM   _______________________________________________________ ER Provider Called:   Neurology Lora Havens, MD  They Recommend admit to medicine    EEG done  ER Provider Called: Nephrology  [place] They Recommend admit to medicine   Will see in AM    ED Triage Vitals  Enc Vitals Group     BP 03/30/22 1407 (!) 107/57     Pulse Rate 03/30/22 1407 79     Resp 03/30/22 1407 17     Temp 03/30/22 1407 98.6 F (37 C)     Temp Source 03/30/22 1407 Oral     SpO2 03/30/22 1407 94 %     Weight 03/30/22 1412 217 lb (98.4 kg)     Height 03/30/22 1412 _0  (1.702 m)     Head Circumference --      Peak Flow --      Pain Score 03/30/22 1412 0     Pain Loc --      Pain Edu? --      Excl. in [place]? --   TMAX(24)@     _________________________________________ Significant initial  Findings: Abnormal Labs Reviewed  CBC WITH DIFFERENTIAL/PLATELET - Abnormal; Notable for the  following components:      Result Value   RBC 3.05 (*)    Hemoglobin 10.6 (*)    HCT 32.1 (*)    MCV 105.2 (*)    [facility]  34.8 (*)    Platelets 126 (*)    All other components within normal limits  BASIC METABOLIC PANEL - Abnormal; Notable for the following components:   Chloride 97 (*)    Glucose, Bld 152 (*)    BUN 34 (*)    Creatinine, Ser 8.66 (*)    GFR, Estimated 5 (*)    All other components within normal limits  TROPONIN I (HIGH SENSITIVITY) - Abnormal; Notable for the following components:   Troponin I (High Sensitivity) 54 (*)    All other components within normal limits     _________________________ Troponin 54  ECG: Ordered Personally reviewed and interpreted by me showing: HR : 102 Rhythm:Atrial-ventricular dual-paced rhythm No further analysis attempted due to paced rhythm QTC 547    The recent clinical data is shown below. Vitals:   03/30/22 1615 03/30/22 1745 03/30/22 1843 03/30/22 1900  BP: 122/78 128/65  (!) 121/58  Pulse: 81 80  80  Resp: (!) _0 Temp:   98 F (36.7 C)   TempSrc:   Oral   SpO2: 98% 97%  94%  Weight:      Height:        WBC     Component Value Date/Time   WBC 7.9 03/30/2022 1438   LYMPHSABS 2.2 03/30/2022 1438   LYMPHSABS 1.7 09/18/2015 1546   MONOABS 0.6 03/30/2022 1438   EOSABS 0.2 03/30/2022 1438   EOSABS 0.3 09/18/2015 1546   BASOSABS 0.0 03/30/2022 1438   BASOSABS 0.0 09/18/2015 1546      UA not ordered  Results for orders placed or performed during the hospital encounter of 02/08/22  MRSA Next Gen by PCR, Nasal     Status: None   Collection Time: 02/09/22  3:52 PM   Specimen: Nasal Mucosa; Nasal Swab  Result Value Ref Range Status   MRSA by PCR Next Gen NOT DETECTED NOT DETECTED Final    Comment: (NOTE) The GeneXpert MRSA Assay (FDA approved for NASAL specimens only), is one component of a comprehensive MRSA colonization surveillance program. It is not intended to diagnose MRSA infection nor to guide or  monitor treatment for MRSA infections. Test performance is not FDA approved in patients less than 62 years old. Performed at [facility], [place] [address] [place], [place] [address]    *Note: Due to a large number of results and/or encounters for the requested time period, some results have not been displayed. A complete set of results can be found in Results Review.     _______________________________________________ Hospitalist was called for admission for   Chest pain with high risk for cardiac etiology  Tonic clonic convulsion ([place])      The following Work up has been ordered so far:  Orders Placed This Encounter  Procedures   CT Head Wo Contrast   DG Chest Port 1 View   CBC with Differential   Basic metabolic panel   Hepatic function panel   Magnesium   Consult to neurology   Consult to hospitalist   EKG 12-Lead   EKG 12-Lead   EEG adult     OTHER Significant initial  Findings:  labs showing:    Recent Labs  Lab 03/30/22 1438  NA 140  K 5.1  CO2 29  GLUCOSE 152*  BUN 34*  CREATININE 8.66*  CALCIUM 9.5  MG 2.0    Cr   Lab Results  Component Value Date   CREATININE 8.66 (H) 03/30/2022  CREATININE 5.58 (H) 02/10/2022   CREATININE 8.91 (H) 02/09/2022    Recent Labs  Lab 03/30/22 1438  AST 23  ALT 25  ALKPHOS 77  BILITOT 0.7  PROT 6.9  ALBUMIN 3.8   Lab Results  Component Value Date   CALCIUM 9.5 03/30/2022   PHOS 6.7 (H) 02/08/2022    Plt: Lab Results  Component Value Date   PLT 126 (L) 03/30/2022       Recent Labs  Lab 03/30/22 1438  WBC 7.9  NEUTROABS 4.8  HGB 10.6*  HCT 32.1*  MCV 105.2*  PLT 126*    HG/HCT  stable,      Component Value Date/Time   HGB 10.6 (L) 03/30/2022 1438   HGB 11.5 (L) 10/02/2021 0908   HCT 32.1 (L) 03/30/2022 1438   HCT 34.4 (L) 10/02/2021 0908   MCV 105.2 (H) 03/30/2022 1438   MCV 100 (H) 10/02/2021 0908       DM  labs:  HbA1C: Recent Labs    11/11/21 1327  HGBA1C 7.5*        CBG (last 3)  No results for input(s): "GLUCAP" in the last 72 hours.        Cultures:    Component Value Date/Time   SDES URINE, CLEAN CATCH 04/07/2019 1931   SPECREQUEST  04/07/2019 1931    NONE Performed at [facility], [place] [address] [place], [place] [address]    CULT >=100,000 COLONIES/mL PSEUDOMONAS AERUGINOSA (A) 04/07/2019 1931   REPTSTATUS 04/09/2019 FINAL 04/07/2019 1931     Radiological Exams on Admission: EEG adult  Result Date: 03/30/2022 Lora Havens, MD     03/30/2022  6:58 PM Patient Name: Gabreal Worton MRN: [id number] Epilepsy Attending: Lora Havens Referring Physician/Provider: Kerney Elbe, MD Date: 03/30/2022 Duration: 23.33 mins Patient history: 88yo M with seizure like activity. EEG to evaluate for seizure Level of alertness: Awake AEDs during EEG study: None Technical aspects: This EEG study was done with scalp electrodes positioned according to the 10-20 International system of electrode placement. Electrical activity was reviewed with band pass filter of 1-_0 , sensitivity of 7 uV/mm, display speed of 59m/sec with a _1  notched filter applied as appropriate. EEG data were recorded continuously and digitally stored.  Video monitoring was available and reviewed as appropriate. Description: The posterior dominant rhythm consists of 9 Hz activity of moderate voltage (25-35 uV) seen predominantly in posterior head regions, symmetric and reactive to eye opening and eye closing. Patient was noted to have episodes of bilateral lower extremity twitching. Concomitant EEG before, during and after the event showed normal posterior dominant rhythm.  Hyperventilation and photic stimulation were not performed.   IMPRESSION: This study is within normal limits. No seizures or epileptiform discharges were seen throughout the recording. Patient was noted to have episodes of bilateral lower extremity twitching without concomitant EEG change. These episodes  were most likely NOT epileptic PLora Havens  DG Chest Port 1 View  Result Date: 03/30/2022 CLINICAL DATA:  Chest pain during hemodialysis. EXAM: PORTABLE CHEST 1 VIEW COMPARISON:  Radiographs 02/08/2022 and 01/21/2022.  CT 02/03/2022. FINDINGS: 1624 hours. Multiple left subclavian AICD leads are grossly stable. The heart is enlarged. There is aortic atherosclerosis and diffuse pulmonary interstitial prominence, similar to the most recent study. No confluent airspace opacity, pneumothorax or significant pleural effusion. The bones appear unremarkable. Several telemetry leads overlie the chest. IMPRESSION: Stable cardiomegaly and diffuse interstitial pulmonary prominence which may reflect mild edema. Electronically Signed  By: Richardean Sale M.D.   On: 03/30/2022 16:43   CT Head Wo Contrast  Result Date: 03/30/2022 CLINICAL DATA:  New onset seizure, altered level of consciousness EXAM: CT HEAD WITHOUT CONTRAST TECHNIQUE: Contiguous axial images were obtained from the base of the skull through the vertex without intravenous contrast. RADIATION DOSE REDUCTION: This exam was performed according to the departmental dose-optimization program which includes automated exposure control, adjustment of the mA and/or kV according to patient size and/or use of iterative reconstruction technique. COMPARISON:  04/01/2019 FINDINGS: Brain: Stable hypodensities throughout the periventricular white matter and basal ganglia, consistent with chronic small vessel ischemic change. No signs of acute infarct or hemorrhage. The lateral ventricles and remaining midline structures are unremarkable. No acute extra-axial fluid collections. No mass effect. Vascular: Stable atherosclerosis.  No hyperdense vessel. Skull: Normal. Negative for fracture or focal lesion. Sinuses/Orbits: Stable postsurgical changes bilateral globes. Otherwise the orbits and paranasal sinuses are unremarkable. Other: None. IMPRESSION: 1. Stable head CT, no  acute intracranial process. Electronically Signed   By: Randa Ngo M.D.   On: 03/30/2022 16:14   _______________________________________________________________________________________________________ Latest  Blood pressure (!) 121/58, pulse 80, temperature 98 F (36.7 C), temperature source Oral, resp. rate 17, height _0  (1.702 m), weight 98.4 kg, SpO2 94 %.   Vitals  labs and radiology finding personally reviewed  Review of Systems:    Pertinent positives include:   chest pain,  Constitutional:  No weight loss, night sweats, Fevers, chills, fatigue, weight loss  HEENT:  No headaches, Difficulty swallowing,Tooth/dental problems,Sore throat,  No sneezing, itching, ear ache, nasal congestion, post nasal drip,  Cardio-vascular:  No Orthopnea, PND, anasarca, dizziness, palpitations.no Bilateral lower extremity swelling  GI:  No heartburn, indigestion, abdominal pain, nausea, vomiting, diarrhea, change in bowel habits, loss of appetite, melena, blood in stool, hematemesis Resp:  no shortness of breath at rest. No dyspnea on exertion, No excess mucus, no productive cough, No non-productive cough, No coughing up of blood.No change in color of mucus.No wheezing. Skin:  no rash or lesions. No jaundice GU:  no dysuria, change in color of urine, no urgency or frequency. No straining to urinate.  No flank pain.  Musculoskeletal:  No joint pain or no joint swelling. No decreased range of motion. No back pain.  Psych:  No change in mood or affect. No depression or anxiety. No memory loss.  Neuro: no localizing neurological complaints, no tingling, no weakness, no double vision, no gait abnormality, no slurred speech, no confusion  All systems reviewed and apart from [place] all are negative _______________________________________________________________________________________________ Past Medical History:   Past Medical History:  Diagnosis Date   AICD (automatic  cardioverter/defibrillator) present    [employer]tronic pacer   Anemia    Anxiety and depression    Arthritis    Bell's palsy    left side. after shingles episode   BPH associated with nocturia    Chronic systolic CHF (congestive heart failure) ([place])    EF normalized by Echo 2019   COPD (chronic obstructive pulmonary disease) ([place])    Severe   Coronary artery disease    a. s/p MI in 1994/1995 while in [place] s/p questionable PCI. 03/2015: progression of disease, for staged PCI.   Diabetic peripheral neuropathy (HCC)    GERD (gastroesophageal reflux disease)    Gout    Hard of hearing    B/L   History of chronic pancreatitis 07/23/2017   noted on CT abd/pelvis   History of shingles  Hypercholesterolemia    Hypertension    Obesity    Sleep apnea    "sleeps w/humidifyer when he panics and gets short of breath" (04/08/2015)   TIA (transient ischemic attack) X 3   Trigger middle finger of left hand    Type II diabetes mellitus ([place])    Wears glasses    Wears hearing aid       Past Surgical History:  Procedure Laterality Date   APPENDECTOMY     AV FISTULA PLACEMENT Right 02/07/2019   Procedure: ARTERIOVENOUS (AV) FISTULA CREATION RIGHT UPPER ARM;  Surgeon: Waynetta Sandy, MD;  Location: [facility];  Service: Vascular;  Laterality: Right;   BIV ICD GENERATOR CHANGEOUT N/A 10/09/2021   Procedure: BIV ICD GENERATOR CHANGEOUT;  Surgeon: Deboraha Sprang, MD;  Location: [facility];  Service: Cardiovascular;  Laterality: N/A;   CARDIAC CATHETERIZATION N/A 03/29/2015   Procedure: Right/Left Heart Cath and Coronary Angiography;  Surgeon: Jaymz M [name], MD;  Location: [facility];  Service: Cardiovascular;  Laterality: N/A;   [place]   "after my MI; put me on heart RX after cath"   CARDIAC CATHETERIZATION N/A 04/09/2015   Procedure: Coronary Stent Intervention;  Surgeon: Reise M [name], MD;  Location: [facility];  Service: Cardiovascular;   Laterality: N/A;   CATARACT EXTRACTION W/ INTRAOCULAR LENS  IMPLANT, BILATERAL     CHOLECYSTECTOMY N/A 10/13/2017   Procedure: LAPAROSCOPIC CHOLECYSTECTOMY WITH LYSIS OF ADHESIONS;  Surgeon: Ileana Roup, MD;  Location: [facility];  Service: General;  Laterality: N/A;   COLONOSCOPY     CORONARY ANGIOGRAPHY N/A 11/12/2021   Procedure: CORONARY ANGIOGRAPHY;  Surgeon: Wellington Hampshire, MD;  Location: [facility];  Service: Cardiovascular;  Laterality: N/A;   CORONARY ANGIOGRAPHY N/A 12/24/2021   Procedure: CORONARY ANGIOGRAPHY;  Surgeon: Troy Sine, MD;  Location: [facility];  Service: Cardiovascular;  Laterality: N/A;   CORONARY BALLOON ANGIOPLASTY N/A 12/24/2021   Procedure: CORONARY BALLOON ANGIOPLASTY;  Surgeon: Troy Sine, MD;  Location: [facility];  Service: Cardiovascular;  Laterality: N/A;   CORONARY BALLOON ANGIOPLASTY N/A 02/09/2022   Procedure: CORONARY BALLOON ANGIOPLASTY;  Surgeon: Nelva Bush, MD;  Location: [facility];  Service: Cardiovascular;  Laterality: N/A;  LAD   CORONARY STENT INTERVENTION N/A 11/12/2021   Procedure: CORONARY STENT INTERVENTION;  Surgeon: Wellington Hampshire, MD;  Location: [facility];  Service: Cardiovascular;  Laterality: N/A;  LAD   DENTAL SURGERY     EP IMPLANTABLE DEVICE N/A 09/23/2015   MDT CRT-D, Dr. Caryl Comes   HIATAL HERNIA REPAIR  1977   ILEOCECETOMY N/A 03/27/2017   Procedure: ILEOCECECTOMY;  Surgeon: Ileana Roup, MD;  Location: [facility];  Service: General;  Laterality: N/A;   INSERT / REPLACE / REMOVE PACEMAKER  07/2008   Complete heart block status post DDD with good function   INTRAVASCULAR IMAGING/OCT N/A 12/24/2021   Procedure: INTRAVASCULAR IMAGING/OCT;  Surgeon: Troy Sine, MD;  Location: [facility];  Service: Cardiovascular;  Laterality: N/A;   INTRAVASCULAR LITHOTRIPSY  02/09/2022   Procedure: INTRAVASCULAR LITHOTRIPSY;  Surgeon: Nelva Bush, MD;  Location: [facility];  Service: Cardiovascular;;  LAD   INTRAVASCULAR ULTRASOUND/IVUS N/A 02/09/2022   Procedure: Intravascular Ultrasound/IVUS;  Surgeon: Nelva Bush, MD;  Location: [facility];  Service: Cardiovascular;  Laterality: N/A;  LAD   IR FLUORO GUIDE CV LINE RIGHT  04/03/2019  IR US GUIDE VASC ACCESS RIGHT  04/03/2019   LAPAROTOMY N/A 03/27/2017   Procedure: EXPLORATORY LAPAROTOMY;  Surgeon: Ileana Roup, MD;  Location: [facility];  Service: General;  Laterality: N/A;   RIGHT HEART CATH AND CORONARY ANGIOGRAPHY N/A 02/09/2022   Procedure: RIGHT HEART CATH AND CORONARY ANGIOGRAPHY;  Surgeon: Nelva Bush, MD;  Location: [facility];  Service: Cardiovascular;  Laterality: N/A;   TONSILLECTOMY     UPPER GI ENDOSCOPY      Social History:  Ambulatory  , walker        reports that he quit smoking about 14 years ago. His smoking use included cigarettes. He has a 81.00 pack-year smoking history. He has never been exposed to tobacco smoke. He has never used smokeless tobacco. He reports current alcohol use. He reports that he does not use drugs.     Family History:  Family History  Problem Relation Age of Onset   Stroke Mother    Leukemia Father    Stroke Sister    Heart attack Brother    ______________________________________________________________________________________________ Allergies: Allergies  Allergen Reactions   Bee Venom Anaphylaxis   Lyrica [Pregabalin] Other (See Comments)    hallucinations   Prednisone Other (See Comments)    hallucinations   Zocor [Simvastatin] Nausea Only and Other (See Comments)    Headache with brand name only.  Can take the generic.     Prior to Admission medications   Medication Sig Start Date End Date Taking? Authorizing Provider  aspirin EC 81 MG tablet Take 81 mg by mouth daily.    [provider]  atorvastatin (LIPITOR) 80 MG tablet Take 1 tablet by mouth daily. 11/14/21   Margie Billet, PA-C  calcitRIOL  (ROCALTROL) 0.25 MCG capsule Take 7 capsules (1.75 mcg total) by mouth every Monday, Wednesday, and Friday with hemodialysis. 01/26/22   [name], Lindley M, MD  carbamide peroxide (DEBROX) 6.5 % OTIC solution Place 5 drops into both ears 2 (two) times daily as needed (earwax). 11/13/21   Lelon Perla, MD  Carboxymethylcellul-Glycerin (LUBRICATING EYE DROPS OP) Place 1 drop into both eyes 3 (three) times daily as needed (dry eyes).    [provider]  clopidogrel (PLAVIX) 75 MG tablet Take 1 tablet (75 mg total) by mouth daily with breakfast. 11/14/21   Margie Billet, PA-C  gabapentin (NEURONTIN) 300 MG capsule Take 1 capsule (300 mg total) by mouth 2 (two) times daily. 03/16/22   Lelon Perla, MD  glucose blood (FREESTYLE TEST STRIPS) test strip Use to check blood sugar daily 05/01/19   [name], MD  glucose monitoring kit (FREESTYLE) monitoring kit USE TO MONITOR BLOOD GLUCOSE AS DIRECTED 05/01/19   [name], MD  Methoxy PEG-Epoetin Beta (MIRCERA IJ) as directed. 02/26/21 07/15/22  [provider]  midodrine (PROAMATINE) 10 MG tablet Take 1 tablet (10 mg) three times a week. Add 5 mg a night after dialysis as needed for low blood pressure. Patient taking differently: Take 5-10 mg by mouth See admin instructions. Take 1 tablet (10 mg) three times a week. Add 5 mg a night after dialysis as needed for low blood pressure. 12/03/21   Sande Rives E, PA-C  multivitamin (RENA-VIT) TABS tablet Take 1 tablet by mouth daily. 08/22/21   [name], MD  nitroGLYCERIN (NITROSTAT) 0.4 MG SL tablet Place 1 tablet under the tongue every 5 minutes x 3 doses as needed for chest pain. 11/13/21   Margie Billet,  PA-C  oxymetazoline (AFRIN) 0.05 % nasal spray Place 1 spray into both nostrils 2 (two) times daily as needed for congestion. 12/29/21   [name], MD  pantoprazole (PROTONIX) 40 MG tablet Take 1 tablet by mouth daily. 11/14/21   Margie Billet, PA-C   saccharomyces boulardii (FLORASTOR) 250 MG capsule Take 1 capsule (250 mg total) by mouth 2 (two) times daily. 04/08/19   Regalado, Belkys A, MD  sevelamer carbonate (RENVELA) 800 MG tablet Take 2 tablets (1,600 mg total) by mouth 3 (three) times daily with meals. Patient taking differently: Take 1,600 mg by mouth See admin instructions. Take 2 tablets by mouth three times daily with meals and 1 tablet with snacks 11/13/21   Lelon Perla, MD  vitamin B-12 (CYANOCOBALAMIN) 1000 MCG tablet Take 1,000 mcg by mouth daily.    [provider]    ___________________________________________________________________________________________________ Physical Exam:    03/30/2022    7:00 PM 03/30/2022    5:45 PM 03/30/2022    4:15 PM  Vitals with BMI  Systolic 008 676 195  Diastolic 58 65 78  Pulse 80 80 81     1. General:  in No  Acute distress     Chronically ill   -appearing 2. Psychological: Alert and   Oriented 3. Head/ENT:   Moist  Mucous Membranes                          Head Non traumatic, neck supple                            Poor Dentition 4. SKIN: normal  Skin turgor,  Skin clean Dry and intact no rash 5. Heart: Regular rate and rhythm no  Murmur, no Rub or gallop 6. Lungs: no wheezes or crackles   7. Abdomen: Soft,  non-tender, Non distended   obese  bowel sounds present 8. Lower extremities: no clubbing, cyanosis, no  edema 9. Neurologically Grossly intact, moving all 4 extremities equally   10. MSK: Normal range of motion    Chart has been reviewed  ______________________________________________________________________________________________  Assessment/Plan 88 y.o. male with medical history significant of  CAD last cardiac catheterization in July 0932, chronic systolic CHF, heart block status post ICD, A-fib, HLD, DM2, end-stage renal disease on hemodialysis Monday Wednesday Friday, OSA, COPD, TIA, BPH,   Admitted for Chest pain with high risk for cardiac  etiology     Present on Admission:  Unstable angina (HCC)  Hyperkalemia, diminished renal excretion  Hypotension  Hyperlipidemia  Atrioventricular block, complete (HCC)  Acute on chronic systolic CHF (congestive heart failure) (HCC)  Atrial fibrillation (HCC) - SCAF  Iron deficiency anemia, unspecified  Tremor     Hyperkalemia, diminished renal excretion Appreciate nephrology input continue to monitor potassium level  ESRD on hemodialysis [facility]) Nephrology aware patient only completed 30 minutes of hemodialysis today will likely need repeat hemodialysis tomorrow.  We will see in consult in the a.m.  Currently not hypoxic continue to monitor No indication for emergent dialysis  Hypotension Continue midodrine 10 mg p.o. 3 times a day  Unstable angina [facility]) Appreciate cardiology consult Continue double therapy of aspirin and Plavix.  N.p.o. postmidnight for cardiac catheterization in a.m.  Hyperlipidemia Chronic stable Continue Lipitor 80 mg p.o. daily  Type II diabetes mellitus ([place]) Order sliding scale sensitive patient not on p.o. medications at home not on insulin  Atrioventricular block, complete (  [place]) Status post AICD cardiology following  Acute on chronic systolic CHF (congestive heart failure) ([place]) In the setting of fluid overload patient has not been able to finish his dialysis plan for hemodialysis in the morning.  Ricardo cardiology and nephrology involvement.  Atrial fibrillation ([place]) - SCAF History of A-fib not on anticoagulation unable to tolerate rate control secondary to chronic hypertension.  Chronic stable  Iron deficiency anemia, unspecified Obtain anemia panel  Tremor Neurology aware seen patient in consult EEG was done showing no evidence of seizure activity.  Defer to neurology for management    Other plan as per orders.  DVT prophylaxis:  SCD     Code Status:    Code Status: Prior FULL CODE   as per patient    I had personally  discussed CODE STATUS with patient     Family Communication:   Family   at  Bedside    Disposition Plan:     To home once workup is complete and patient is stable   Following barriers for discharge:                            Electrolytes corrected                               Anemia stable                                                         Will need consultants to evaluate patient prior to discharge                        Would benefit from PT/OT eval prior to [place] called: Cardiology, nephrology , neurology   Admission status:  ED Disposition     ED Disposition  [place]: [place] [[id number]]  Level of Care: Progressive [102]  Admit to Progressive based on following criteria: CARDIOVASCULAR & THORACIC of moderate stability with acute coronary syndrome symptoms/low risk myocardial infarction/hypertensive urgency/arrhythmias/heart failure potentially compromising stability and stable post cardiovascular intervention patients.  May place patient in observation at [facility] or [facility] if equivalent level of care is available:: No  Covid Evaluation: Asymptomatic - no recent exposure (last 10 days) testing not required  Diagnosis: Unstable angina [facility]) [[id number]]  Admitting Physician: Toy Baker [[id number]]  Attending Physician: Toy Baker [[id number]]           Obs       Level of care       progressive tele indefinitely please discontinue once patient no longer qualifies COVID-19 Labs    Lab Results  Component Value Date   [place] NEGATIVE 04/01/2019       Abaigeal Moomaw 03/30/2022, 9:28 PM    [facility]     after 2 AM please page floor coverage PA If 7AM-7PM, please contact the day team taking care of the patient using [email or web address]   Patient was  evaluated in the context of the global COVID-19 pandemic, which necessitated  consideration that the patient might be at risk for infection with the SARS-CoV-2 virus that causes COVID-19. Institutional protocols and algorithms that pertain to the evaluation of patients at risk for COVID-19 are in a state of rapid change based on information released by regulatory bodies including the CDC and federal and state organizations. These policies and algorithms were followed during the patient's care.

## 2022-03-30 NOTE — Assessment & Plan Note (Addendum)
Nephrology aware patient only completed 30 minutes of hemodialysis today will likely need repeat hemodialysis tomorrow.  We will see in consult in the a.m.  Currently not hypoxic continue to monitor No indication for emergent dialysis

## 2022-03-30 NOTE — ED Provider Notes (Signed)
Shift handoff from [place] at 3:15 PM. 88 year old male here with end-stage renal disease who left dialysis early today due to chest pain.  He has been having increased frequency of chest pain.  He has high risk heart disease.  Lately his nitroglycerin has not been working well.  Patient also had an episode of full body shaking while at dialysis today.  He apparently has not been worked up for this today.  He is conscious throughout the event but is unable to respond.. Patient underwent coronary stent intervention by Dr. Rogue Jury on 11/12/2021.  He had extensive coronary artery disease and had a drug-eluting stent placed in the ostial/proximal LAD. Echocardiogram at that time showed EF of 45 to 50% He has an AICD in place. Physical Exam  BP (!) 140/81   Pulse 80   Temp 98.6 F (37 C) (Oral)   Resp 17   Ht 5\' 7"  (1.702 m)   Wt 98.4 kg   SpO2 99%   BMI 33.99 kg/m   Physical Exam During patient work-up patient had onset of shaking that began in his right arm then progressed to his right leg.  He had stuttering speech and was unable to get his words out.  During this time he had chest pain, increased breathing rate and increased heart rate. Symptoms are concerning for focal partial seizure activity Procedures  Procedures  ED Course / MDM   Clinical Course as of 03/31/22 1551  Mon Mar 30, 2022  1721 I spoke with cardiology (NP Maylon Peppers) - cardiology will consult on the patient. [AH]  1725 I spoke with Dr. Marval Regal who will set up pt for dialysis  [AH]  1803 Case discussed with Dr. Cheral Marker of Neurology- will evaluate the patient and has ordered an EEG  [AH]    Clinical Course User Index [AH] Margarita Mail, PA-C     EKG Interpretation  Date/Time:  Monday March 30 2022 14:09:53 EDT Ventricular Rate:  81 PR Interval:  176 QRS Duration: 168 QT Interval:  456 QTC Calculation: 529 R Axis:   263 Text Interpretation: AV dual-paced rhythm Biventricular pacemaker detected Abnormal  ECG When compared with ECG of 10-Feb-2022 06:11, No significant change was found Confirmed by Godfrey Pick ([id number]) on 03/30/2022 3:35:01 PM        Medical Decision Making Amount and/or Complexity of Data Reviewed Labs: ordered. Radiology: ordered.  Risk Prescription drug management. Decision regarding hospitalization.  Currently awaiting lab work-up.  Patient will be admitted, case discussed with Dr. Roel Cluck.      Margarita Mail, PA-C 03/31/22 1557    Godfrey Pick, MD 04/01/22 [phone]

## 2022-03-30 NOTE — ED Triage Notes (Signed)
Sent from dialysis due to CP.  Dialysis left needle in right arm graft.  Reports angina intermittently.  Patient has been taking nitro to help with pain.  Today had 3 episodes today and has taken nitro.  At dialysis started having tremors.

## 2022-03-30 NOTE — ED Notes (Signed)
Approx 1615 the pt returned from CT and I was informed that he was SOB, having CP and trembling.  I alerted Abigail-PA and he was settling down some as we entered the room.  As we talked with him over the next 5 min or so he had another episode in front of [place].  He spaces out a little and stutters along with the shaking.  He did state that he usually takes nitro during these episodes but that pain was resolving some without being given nitro this time

## 2022-03-30 NOTE — ED Notes (Signed)
Patient asks to walk to the bathroom This RN witnessed the patient become very short of breath when adjusting himself in the bed. Patient winded and could not speak during the time he was adjusting in the bed with increased respiratory rate of 35. This Rn reminds patient that it appears he was short of breath and she would feel more comfortable if the patient used the bedside commode or bedpan. Patient reports that he doesn't want to do that now and will let this RN know with his call bell if he changes his mind. Patient also verbalizes understanding of NPO status and plan of care

## 2022-03-30 NOTE — Assessment & Plan Note (Signed)
Status post AICD cardiology following

## 2022-03-30 NOTE — Progress Notes (Signed)
EEG complete - results pending 

## 2022-03-30 NOTE — Assessment & Plan Note (Signed)
Obtain anemia panel 

## 2022-03-30 NOTE — Procedures (Signed)
Patient Name: Rodney Farley  MRN: [id number]  Epilepsy Attending: Lora Havens  Referring Physician/Provider: Kerney Elbe, MD Date: 03/30/2022 Duration: 23.33 mins  Patient history: 88yo M with seizure like activity. EEG to evaluate for seizure  Level of alertness: Awake  AEDs during EEG study: None  Technical aspects: This EEG study was done with scalp electrodes positioned according to the 10-20 International system of electrode placement. Electrical activity was reviewed with band pass filter of 1-70Hz , sensitivity of 7 uV/mm, display speed of 35mm/sec with a 60Hz  notched filter applied as appropriate. EEG data were recorded continuously and digitally stored.  Video monitoring was available and reviewed as appropriate.  Description: The posterior dominant rhythm consists of 9 Hz activity of moderate voltage (25-35 uV) seen predominantly in posterior head regions, symmetric and reactive to eye opening and eye closing.   Patient was noted to have episodes of bilateral lower extremity twitching. Concomitant EEG before, during and after the event showed normal posterior dominant rhythm.   Hyperventilation and photic stimulation were not performed.     IMPRESSION: This study is within normal limits. No seizures or epileptiform discharges were seen throughout the recording.  Patient was noted to have episodes of bilateral lower extremity twitching without concomitant EEG change. These episodes were most likely NOT epileptic  Maelie Chriswell Barbra Sarks

## 2022-03-30 NOTE — Assessment & Plan Note (Signed)
History of A-fib not on anticoagulation unable to tolerate rate control secondary to chronic hypertension.  Chronic stable

## 2022-03-30 NOTE — Assessment & Plan Note (Signed)
Neurology aware seen patient in consult EEG was done showing no evidence of seizure activity.  Defer to neurology for management

## 2022-03-30 NOTE — Telephone Encounter (Signed)
Pt c/o of Chest Pain: STAT if CP now or developed within 24 hours  1. Are you having CP right now?   His Dialysis center called because patient was having trouble reaching [place].   2. Are you experiencing any other symptoms (ex. SOB, nausea, vomiting, sweating)? Unsure.   3. How long have you been experiencing CP? Has been having chest pain for the past three days.   4. Is your CP continuous or coming and going? Unsure   5. Have you taken Nitroglycerin? Yes.  ?

## 2022-03-30 NOTE — ED Provider Triage Note (Signed)
Emergency Medicine Provider Triage Evaluation Note  Damauri Minion , a 88 y.o. male  was evaluated in triage.  Pt complains of chest pain.  Intermittent.  Has known CAD.  He is a dialysis patient.  Only did 30 minutes per session today.  He has had 3 episodes of chest pain today, helped with nitro.  Review of Systems  Positive: CP Negative:   Physical Exam  BP (!) 107/57 (BP Location: Left Arm)   Pulse 79   Temp 98.6 F (37 C) (Oral)   Resp 17   Ht 5\' 7"  (1.702 m)   Wt 98.4 kg   SpO2 94%   BMI 33.99 kg/m  Gen:   Awake, no distress   Resp:  Normal effort  MSK:   Moves extremities without difficulty, Dialysis access to RUE, still accessed from clinic Other:    Medical Decision Making  Medically screening exam initiated at 2:15 PM.  Appropriate orders placed.  Deshon Hsiao was informed that the remainder of the evaluation will be completed by another provider, this initial triage assessment does not replace that evaluation, and the importance of remaining in the ED until their evaluation is complete.  CP   Havish Petties A, PA-C 03/30/22 1416

## 2022-03-30 NOTE — ED Provider Notes (Signed)
[facility] EMERGENCY DEPARTMENT Provider Note   CSN: [id number] Arrival date & time: 03/30/22  1325     History  Chief Complaint  Patient presents with   Chest Pain    Rodney Farley is a 88 y.o. male with considerable past medical history including COPD, hyperlipidemia, previous TIA, diabetes, ESRD on dialysis Monday Wednesday Friday, peripheral neuropathy who presents with concern for worsening anginal chest pain which is worse with exertion, associated with shortness of breath.  Patient reports that he has been self administering nitro secondary to increasing anginal chest pain which usually resolves his symptoms including radiation into the left arm.  Patient reports that he had 3 episodes this morning, and nitroglycerin was not significantly resolving his pain.  Patient reports he was about 30 minutes into dialysis when he had a episode of waking tremors during which he briefly became unable to respond, but reports that he was conscious for the entire time, thereafter became able to respond, tremors ceased, patient without any description of postictal episode, confusion, double vision, new weakness, numbness, confusion, memory loss.  Patient has baseline left-sided facial droop secondary to Bell's palsy, reports that this is normal for him, no significantly worse today.  Patient does report that he has been struggling with peripheral neuropathy which is quite painful, reports that it seems the tingling and numbness as well as the pain are worse today than recently.  At this time patient reports no significant active chest pain but reports significant pain in bilateral lower extremities, denies significant shortness of breath, does not feel any similar tremulous activity, no previous history of Parkinson's, no report of vitals being taken during dialysis at time of this event, for possible hypotensive episode.   Chest Pain      Home Medications Prior to Admission  medications   Medication Sig Start Date End Date Taking? Authorizing Provider  aspirin EC 81 MG tablet Take 81 mg by mouth daily.    [provider]  atorvastatin (LIPITOR) 80 MG tablet Take 1 tablet by mouth daily. 11/14/21   Margie Billet, PA-C  calcitRIOL (ROCALTROL) 0.25 MCG capsule Take 7 capsules (1.75 mcg total) by mouth every Monday, Wednesday, and Friday with hemodialysis. 01/26/22   [name], Davan M, MD  carbamide peroxide (DEBROX) 6.5 % OTIC solution Place 5 drops into both ears 2 (two) times daily as needed (earwax). 11/13/21   Lelon Perla, MD  Carboxymethylcellul-Glycerin (LUBRICATING EYE DROPS OP) Place 1 drop into both eyes 3 (three) times daily as needed (dry eyes).    [provider]  clopidogrel (PLAVIX) 75 MG tablet Take 1 tablet (75 mg total) by mouth daily with breakfast. 11/14/21   Margie Billet, PA-C  gabapentin (NEURONTIN) 300 MG capsule Take 1 capsule (300 mg total) by mouth 2 (two) times daily. 03/16/22   Lelon Perla, MD  glucose blood (FREESTYLE TEST STRIPS) test strip Use to check blood sugar daily 05/01/19   [name], MD  glucose monitoring kit (FREESTYLE) monitoring kit USE TO MONITOR BLOOD GLUCOSE AS DIRECTED 05/01/19   [name], MD  Methoxy PEG-Epoetin Beta (MIRCERA IJ) as directed. 02/26/21 07/15/22  [provider]  midodrine (PROAMATINE) 10 MG tablet Take 1 tablet (10 mg) three times a week. Add 5 mg a night after dialysis as needed for low blood pressure. Patient taking differently: Take 5-10 mg by mouth See admin instructions. Take 1 tablet (10 mg) three times a week. Add 5 mg a  night after dialysis as needed for low blood pressure. 12/03/21   Sande Rives E, PA-C  multivitamin (RENA-VIT) TABS tablet Take 1 tablet by mouth daily. 08/22/21   [name], MD  nitroGLYCERIN (NITROSTAT) 0.4 MG SL tablet Place 1 tablet under the tongue every 5 minutes x 3 doses as needed for chest pain. 11/13/21   Margie Billet, PA-C  oxymetazoline (AFRIN) 0.05 % nasal spray Place 1 spray into both nostrils 2 (two) times daily as needed for congestion. 12/29/21   [name], MD  pantoprazole (PROTONIX) 40 MG tablet Take 1 tablet by mouth daily. 11/14/21   Margie Billet, PA-C  saccharomyces boulardii (FLORASTOR) 250 MG capsule Take 1 capsule (250 mg total) by mouth 2 (two) times daily. 04/08/19   Regalado, Belkys A, MD  sevelamer carbonate (RENVELA) 800 MG tablet Take 2 tablets (1,600 mg total) by mouth 3 (three) times daily with meals. Patient taking differently: Take 1,600 mg by mouth See admin instructions. Take 2 tablets by mouth three times daily with meals and 1 tablet with snacks 11/13/21   Lelon Perla, MD  vitamin B-12 (CYANOCOBALAMIN) 1000 MCG tablet Take 1,000 mcg by mouth daily.    [provider]      Allergies    Bee venom, Lyrica [pregabalin], Prednisone, and Zocor [simvastatin]    Review of Systems   Review of Systems  Cardiovascular:  Positive for chest pain.  All other systems reviewed and are negative.   Physical Exam Updated Vital Signs BP (!) 140/81   Pulse 80   Temp 98.6 F (37 C) (Oral)   Resp 17   Ht '5\' 7"'  (1.702 m)   Wt 98.4 kg   SpO2 99%   BMI 33.99 kg/m  Physical Exam Vitals and nursing note reviewed.  Constitutional:      General: He is not in acute distress.    Appearance: Normal appearance. He is ill-appearing.  HENT:     Head: Normocephalic and atraumatic.  Eyes:     General:        Right eye: No discharge.        Left eye: No discharge.  Cardiovascular:     Rate and Rhythm: Normal rate and regular rhythm.     Heart sounds: No murmur heard.    No friction rub. No gallop.  Pulmonary:     Effort: Pulmonary effort is normal.     Breath sounds: Normal breath sounds.  Abdominal:     General: Bowel sounds are normal.     Palpations: Abdomen is soft.  Skin:    General: Skin is warm and dry.     Capillary Refill: Capillary refill  takes less than 2 seconds.  Neurological:     Mental Status: He is alert and oriented to person, place, and time.     Comments: Cranial nerves II through XII grossly intact.  Patient has left-sided facial droop noted but reports that this is similar to his baseline secondary to Bell's palsy.  Intact finger-nose, intact heel-to-shin.  Romberg negative, gait normal.  Alert and oriented x3.  Moves all 4 limbs spontaneously, normal coordination.  No pronator drift.  Intact strength 5 out of 5 bilateral upper and lower extremities.    Psychiatric:        Mood and Affect: Mood normal.        Behavior: Behavior normal.     ED Results / Procedures / Treatments   Labs (all labs ordered are listed,  but only abnormal results are displayed) Labs Reviewed  CBC WITH DIFFERENTIAL/PLATELET  BASIC METABOLIC PANEL  HEPATIC FUNCTION PANEL  MAGNESIUM  TROPONIN I (HIGH SENSITIVITY)    EKG None  Radiology No results found.  Procedures Procedures    Medications Ordered in ED Medications  fentaNYL (SUBLIMAZE) injection 50 mcg (has no administration in time range)  nitroGLYCERIN (NITROSTAT) SL tablet 0.4 mg (has no administration in time range)    ED Course/ Medical Decision Making/ A&P                           Medical Decision Making  This patient is a 88 y.o. male who presents to the ED for concern of chest pain, as well as tremor activity, this involves an extensive number of treatment options, and is a complaint that carries with it a high risk of complications and morbidity. The emergent differential diagnosis prior to evaluation includes, but is not limited to, electrolyte abnormality, dehydration, hypotensive episode, seizure, stroke, TIA, ACS, AAS, PE, Mallory-Weiss, Boerhaave's, Pneumonia, acute bronchitis, asthma or COPD exacerbation, anxiety, MSK pain or traumatic injury to the chest, acid reflux versus other.   This is not an exhaustive differential.   Past Medical History /  Co-morbidities / Social History: COPD, hyperlipidemia, previous TIA, diabetes, ESRD on dialysis Monday Wednesday Friday, peripheral neuropathy, complete AV block with pacemaker  Physical Exam: Physical exam performed. The pertinent findings include: On exam patient with noted left-sided facial droop which she reports is his baseline secondary to Bell's palsy several years ago, no significant change compared to previous.  He is otherwise normal strength, coordination, and no focal neurologic deficits.  He is not postictal and in fact is alert, oriented x3 with good insight.   Cardiac Monitoring:  The patient was maintained on a cardiac monitor.  My attending physician Dr. Ernesto Rutherford viewed and interpreted the cardiac monitored which showed an underlying rhythm of: AV dual paced rhythm. I agree with this interpretation.  I discussed this case with my attending physician Dr. Ernesto Rutherford who cosigned this note including patient's presenting symptoms, physical exam, and planned diagnostics and interventions. Attending physician stated agreement with plan or made changes to plan which were implemented.    3:19 PM Care of Rodney Farley transferred to [place] and Dr. Godfrey Pick at the end of my shift as the patient will require reassessment once labs/imaging have resulted. Patient presentation, ED course, and plan of care discussed with review of all pertinent labs and imaging. Please see his/her note for further details regarding further ED course and disposition. Plan at time of handoff is pending ACS work-up as well as CT head, reevaluation of patient's clinical condition in context of his recent trauma activity disposition based on these results. This may be altered or completely changed at the discretion of the oncoming team pending results of further workup.    Final Clinical Impression(s) / ED Diagnoses Final diagnoses:  None    Rx / DC Orders ED Discharge Orders     None          Anselmo Pickler, PA-C 03/30/22 [place], [place], DO 03/30/22 1544

## 2022-03-31 ENCOUNTER — Observation Stay (HOSPITAL_COMMUNITY): Payer: PPO

## 2022-03-31 ENCOUNTER — Encounter (HOSPITAL_COMMUNITY)
Admission: EM | Disposition: A | Discharge status: Home Health | Payer: Self-pay | Source: Home / Self Care | Attending: Internal Medicine

## 2022-03-31 DIAGNOSIS — Z66 Do not resuscitate: Secondary | ICD-10-CM | POA: Diagnosis present

## 2022-03-31 DIAGNOSIS — D696 Thrombocytopenia, unspecified: Secondary | ICD-10-CM | POA: Diagnosis present

## 2022-03-31 DIAGNOSIS — N186 End stage renal disease: Secondary | ICD-10-CM | POA: Diagnosis present

## 2022-03-31 DIAGNOSIS — E7800 Pure hypercholesterolemia, unspecified: Secondary | ICD-10-CM | POA: Diagnosis present

## 2022-03-31 DIAGNOSIS — T82855A Stenosis of coronary artery stent, initial encounter: Secondary | ICD-10-CM | POA: Diagnosis present

## 2022-03-31 DIAGNOSIS — I5023 Acute on chronic systolic (congestive) heart failure: Secondary | ICD-10-CM | POA: Diagnosis not present

## 2022-03-31 DIAGNOSIS — E1122 Type 2 diabetes mellitus with diabetic chronic kidney disease: Secondary | ICD-10-CM | POA: Diagnosis present

## 2022-03-31 DIAGNOSIS — I9589 Other hypotension: Secondary | ICD-10-CM | POA: Diagnosis present

## 2022-03-31 DIAGNOSIS — I132 Hypertensive heart and chronic kidney disease with heart failure and with stage 5 chronic kidney disease, or end stage renal disease: Secondary | ICD-10-CM | POA: Diagnosis present

## 2022-03-31 DIAGNOSIS — I200 Unstable angina: Secondary | ICD-10-CM | POA: Diagnosis present

## 2022-03-31 DIAGNOSIS — E669 Obesity, unspecified: Secondary | ICD-10-CM | POA: Diagnosis present

## 2022-03-31 DIAGNOSIS — D509 Iron deficiency anemia, unspecified: Secondary | ICD-10-CM | POA: Diagnosis present

## 2022-03-31 DIAGNOSIS — I4891 Unspecified atrial fibrillation: Secondary | ICD-10-CM | POA: Diagnosis present

## 2022-03-31 DIAGNOSIS — F32A Depression, unspecified: Secondary | ICD-10-CM | POA: Diagnosis present

## 2022-03-31 DIAGNOSIS — G253 Myoclonus: Secondary | ICD-10-CM | POA: Diagnosis present

## 2022-03-31 DIAGNOSIS — I442 Atrioventricular block, complete: Secondary | ICD-10-CM | POA: Diagnosis present

## 2022-03-31 DIAGNOSIS — D631 Anemia in chronic kidney disease: Secondary | ICD-10-CM | POA: Diagnosis present

## 2022-03-31 DIAGNOSIS — R251 Tremor, unspecified: Secondary | ICD-10-CM | POA: Diagnosis not present

## 2022-03-31 DIAGNOSIS — N2581 Secondary hyperparathyroidism of renal origin: Secondary | ICD-10-CM | POA: Diagnosis present

## 2022-03-31 DIAGNOSIS — G40409 Other generalized epilepsy and epileptic syndromes, not intractable, without status epilepticus: Secondary | ICD-10-CM | POA: Diagnosis not present

## 2022-03-31 DIAGNOSIS — I350 Nonrheumatic aortic (valve) stenosis: Secondary | ICD-10-CM | POA: Diagnosis present

## 2022-03-31 DIAGNOSIS — I5022 Chronic systolic (congestive) heart failure: Secondary | ICD-10-CM | POA: Diagnosis present

## 2022-03-31 DIAGNOSIS — R569 Unspecified convulsions: Secondary | ICD-10-CM | POA: Diagnosis not present

## 2022-03-31 DIAGNOSIS — Z515 Encounter for palliative care: Secondary | ICD-10-CM | POA: Diagnosis not present

## 2022-03-31 DIAGNOSIS — Y831 Surgical operation with implant of artificial internal device as the cause of abnormal reaction of the patient, or of later complication, without mention of misadventure at the time of the procedure: Secondary | ICD-10-CM | POA: Diagnosis present

## 2022-03-31 DIAGNOSIS — I25110 Atherosclerotic heart disease of native coronary artery with unstable angina pectoris: Secondary | ICD-10-CM | POA: Diagnosis present

## 2022-03-31 DIAGNOSIS — R079 Chest pain, unspecified: Secondary | ICD-10-CM | POA: Diagnosis not present

## 2022-03-31 DIAGNOSIS — E1142 Type 2 diabetes mellitus with diabetic polyneuropathy: Secondary | ICD-10-CM | POA: Diagnosis present

## 2022-03-31 DIAGNOSIS — Z992 Dependence on renal dialysis: Secondary | ICD-10-CM | POA: Diagnosis not present

## 2022-03-31 DIAGNOSIS — J449 Chronic obstructive pulmonary disease, unspecified: Secondary | ICD-10-CM | POA: Diagnosis present

## 2022-03-31 HISTORY — PX: CORONARY BALLOON ANGIOPLASTY: CATH118233

## 2022-03-31 HISTORY — PX: CORONARY ANGIOGRAPHY: CATH118303

## 2022-03-31 LAB — CBC
HCT: 28.8 % — ABNORMAL LOW (ref 39.0–52.0)
Hemoglobin: 9.3 g/dL — ABNORMAL LOW (ref 13.0–17.0)
MCH: 35.2 pg — ABNORMAL HIGH (ref 26.0–34.0)
MCHC: 32.3 g/dL (ref 30.0–36.0)
MCV: 109.1 fL — ABNORMAL HIGH (ref 80.0–100.0)
Platelets: 117 10*3/uL — ABNORMAL LOW (ref 150–400)
RBC: 2.64 MIL/uL — ABNORMAL LOW (ref 4.22–5.81)
RDW: 14.9 % (ref 11.5–15.5)
WBC: 5.8 10*3/uL (ref 4.0–10.5)
nRBC: 0 % (ref 0.0–0.2)

## 2022-03-31 LAB — COMPREHENSIVE METABOLIC PANEL
ALT: 22 U/L (ref 0–44)
AST: 18 U/L (ref 15–41)
Albumin: 3.3 g/dL — ABNORMAL LOW (ref 3.5–5.0)
Alkaline Phosphatase: 67 U/L (ref 38–126)
Anion gap: 12 (ref 5–15)
BUN: 37 mg/dL — ABNORMAL HIGH (ref 8–23)
CO2: 28 mmol/L (ref 22–32)
Calcium: 9 mg/dL (ref 8.9–10.3)
Chloride: 100 mmol/L (ref 98–111)
Creatinine, Ser: 8.97 mg/dL — ABNORMAL HIGH (ref 0.61–1.24)
GFR, Estimated: 5 mL/min — ABNORMAL LOW (ref >60)
Glucose, Bld: 151 mg/dL — ABNORMAL HIGH (ref 70–99)
Potassium: 5.3 mmol/L — ABNORMAL HIGH (ref 3.5–5.1)
Sodium: 140 mmol/L (ref 135–145)
Total Bilirubin: 0.8 mg/dL (ref 0.3–1.2)
Total Protein: 5.9 g/dL — ABNORMAL LOW (ref 6.5–8.1)

## 2022-03-31 LAB — POCT ACTIVATED CLOTTING TIME
Activated Clotting Time: 215 seconds
Activated Clotting Time: 239 seconds
Activated Clotting Time: 197 seconds
Activated Clotting Time: 173 seconds

## 2022-03-31 LAB — CBG MONITORING, ED
Glucose-Capillary: 128 mg/dL — ABNORMAL HIGH (ref 70–99)
Glucose-Capillary: 90 mg/dL (ref 70–99)
Glucose-Capillary: 96 mg/dL (ref 70–99)

## 2022-03-31 LAB — HEPATITIS B SURFACE ANTIBODY,QUALITATIVE: Hep B S Ab: REACTIVE — AB

## 2022-03-31 LAB — TROPONIN I (HIGH SENSITIVITY): Troponin I (High Sensitivity): 59 ng/L — ABNORMAL HIGH (ref <18)

## 2022-03-31 LAB — HEPATITIS B SURFACE ANTIGEN: Hepatitis B Surface Ag: NONREACTIVE

## 2022-03-31 LAB — HEPATITIS B CORE ANTIBODY, TOTAL: Hep B Core Total Ab: NONREACTIVE

## 2022-03-31 LAB — HEPATITIS C ANTIBODY: HCV Ab: NONREACTIVE

## 2022-03-31 LAB — GLUCOSE, CAPILLARY
Glucose-Capillary: 86 mg/dL (ref 70–99)
Glucose-Capillary: 93 mg/dL (ref 70–99)

## 2022-03-31 SURGERY — CORONARY ANGIOGRAPHY (CATH LAB)
Anesthesia: LOCAL

## 2022-03-31 MED ORDER — SODIUM CHLORIDE 0.9% FLUSH: 3.0000 mL | INTRAVENOUS | Status: DC | PRN

## 2022-03-31 MED ORDER — HEPARIN SODIUM (PORCINE) 1000 UNIT/ML IJ SOLN
INTRAMUSCULAR | Status: AC
  Filled 2022-03-31: qty 10

## 2022-03-31 MED ORDER — SODIUM CHLORIDE 0.9 % IV SOLN: INTRAVENOUS | Status: DC

## 2022-03-31 MED ORDER — SEVELAMER CARBONATE 800 MG PO TABS
800.0000 mg | ORAL_TABLET | ORAL | Status: DC | PRN
  Administered 2022-03-31: 800 mg via ORAL
  Filled 2022-03-31: qty 1

## 2022-03-31 MED ORDER — SEVELAMER CARBONATE 800 MG PO TABS
1600.0000 mg | ORAL_TABLET | Freq: Three times a day (TID) | ORAL | Status: DC
  Administered 2022-04-01 – 2022-04-02 (×5): 1600 mg via ORAL
  Filled 2022-03-31 (×5): qty 2

## 2022-03-31 MED ORDER — HEPARIN SODIUM (PORCINE) 1000 UNIT/ML IJ SOLN
INTRAMUSCULAR | Status: DC | PRN
  Administered 2022-03-31: 8000 [IU] via INTRAVENOUS
  Administered 2022-03-31: 1000 [IU] via INTRAVENOUS

## 2022-03-31 MED ORDER — SODIUM CHLORIDE 0.9% FLUSH
3.0000 mL | Freq: Two times a day (BID) | INTRAVENOUS | Status: DC
  Administered 2022-04-01: 3 mL via INTRAVENOUS

## 2022-03-31 MED ORDER — CHLORHEXIDINE GLUCONATE CLOTH 2 % EX PADS: 6.0000 | MEDICATED_PAD | Freq: Every day | CUTANEOUS | Status: DC

## 2022-03-31 MED ORDER — IOHEXOL 350 MG/ML SOLN
INTRAVENOUS | Status: DC | PRN
  Administered 2022-03-31: 80 mL

## 2022-03-31 MED ORDER — HEPARIN (PORCINE) IN NACL 1000-0.9 UT/500ML-% IV SOLN
INTRAVENOUS | Status: DC | PRN
  Administered 2022-03-31 (×2): 500 mL

## 2022-03-31 MED ORDER — HEPARIN (PORCINE) IN NACL 1000-0.9 UT/500ML-% IV SOLN
INTRAVENOUS | Status: AC
  Filled 2022-03-31: qty 500

## 2022-03-31 MED ORDER — SODIUM CHLORIDE 0.9 % IV SOLN: 250.0000 mL | INTRAVENOUS | Status: DC | PRN

## 2022-03-31 MED ORDER — LABETALOL HCL 5 MG/ML IV SOLN: 10.0000 mg | INTRAVENOUS | Status: AC | PRN

## 2022-03-31 MED ORDER — CLOPIDOGREL BISULFATE 75 MG PO TABS: 75.0000 mg | ORAL_TABLET | Freq: Every day | ORAL | Status: DC

## 2022-03-31 MED ORDER — VERAPAMIL HCL 2.5 MG/ML IV SOLN
INTRAVENOUS | Status: AC
  Filled 2022-03-31: qty 2

## 2022-03-31 MED ORDER — LIDOCAINE HCL (PF) 1 % IJ SOLN
INTRAMUSCULAR | Status: AC
  Filled 2022-03-31: qty 30

## 2022-03-31 MED ORDER — ONDANSETRON HCL 4 MG/2ML IJ SOLN: 4.0000 mg | Freq: Four times a day (QID) | INTRAMUSCULAR | Status: DC | PRN

## 2022-03-31 MED ORDER — MIDAZOLAM HCL 2 MG/2ML IJ SOLN
INTRAMUSCULAR | Status: AC
  Filled 2022-03-31: qty 2

## 2022-03-31 MED ORDER — NITROGLYCERIN IN D5W 200-5 MCG/ML-% IV SOLN: 0.0000 ug/min | INTRAVENOUS | Status: DC

## 2022-03-31 MED ORDER — FENTANYL CITRATE (PF) 100 MCG/2ML IJ SOLN
INTRAMUSCULAR | Status: DC | PRN
  Administered 2022-03-31: 12.5 ug via INTRAVENOUS

## 2022-03-31 MED ORDER — HYDRALAZINE HCL 20 MG/ML IJ SOLN: 10.0000 mg | INTRAMUSCULAR | Status: AC | PRN

## 2022-03-31 MED ORDER — MIDAZOLAM HCL 2 MG/2ML IJ SOLN
INTRAMUSCULAR | Status: DC | PRN
  Administered 2022-03-31: .5 mg via INTRAVENOUS

## 2022-03-31 MED ORDER — FENTANYL CITRATE (PF) 100 MCG/2ML IJ SOLN
INTRAMUSCULAR | Status: AC
  Filled 2022-03-31: qty 2

## 2022-03-31 MED ORDER — METOCLOPRAMIDE HCL 5 MG/ML IJ SOLN
5.0000 mg | Freq: Four times a day (QID) | INTRAMUSCULAR | Status: DC | PRN
  Administered 2022-03-31: 5 mg via INTRAVENOUS
  Filled 2022-03-31: qty 2

## 2022-03-31 MED ORDER — LIDOCAINE HCL (PF) 1 % IJ SOLN
INTRAMUSCULAR | Status: DC | PRN
  Administered 2022-03-31: 10 mL

## 2022-03-31 MED ORDER — ASPIRIN 81 MG PO CHEW
81.0000 mg | CHEWABLE_TABLET | ORAL | Status: AC
  Administered 2022-03-31: 81 mg via ORAL
  Filled 2022-03-31: qty 1

## 2022-03-31 MED ORDER — ASPIRIN 81 MG PO CHEW: 81.0000 mg | CHEWABLE_TABLET | Freq: Every day | ORAL | Status: DC

## 2022-03-31 MED ORDER — ACETAMINOPHEN 325 MG PO TABS: 650.0000 mg | ORAL_TABLET | ORAL | Status: DC | PRN

## 2022-03-31 SURGICAL SUPPLY — 20 items
BALL SAPPHIRE NC24 4.0X12 (BALLOONS) ×1
BALLN WOLVERINE 3.50X10 (BALLOONS) ×1
BALLOON SAPPHIRE NC24 4.0X12 (BALLOONS) IMPLANT
BALLOON WOLVERINE 3.50X10 (BALLOONS) IMPLANT
CATH INFINITI 5FR JL5 (CATHETERS) IMPLANT
CATH INFINITI 5FR MULTPACK ANG (CATHETERS) IMPLANT
CATH LAUNCHER 6FR EBU 4 (CATHETERS) IMPLANT
KIT ENCORE 26 ADVANTAGE (KITS) IMPLANT
KIT HEART LEFT (KITS) ×1 IMPLANT
KIT HEMO VALVE WATCHDOG (MISCELLANEOUS) IMPLANT
KIT MICROPUNCTURE NIT STIFF (SHEATH) IMPLANT
PACK CARDIAC CATHETERIZATION (CUSTOM PROCEDURE TRAY) ×1 IMPLANT
SHEATH PINNACLE 5F 10CM (SHEATH) IMPLANT
SHEATH PINNACLE 6F 10CM (SHEATH) IMPLANT
SHEATH PROBE COVER 6X72 (BAG) IMPLANT
TRANSDUCER W/STOPCOCK (MISCELLANEOUS) ×1 IMPLANT
TUBING CIL FLEX 10 FLL-RA (TUBING) ×1 IMPLANT
WIRE ASAHI PROWATER 180CM (WIRE) IMPLANT
WIRE EMERALD 3MM-J .035X150CM (WIRE) IMPLANT
WIRE RUNTHROUGH .014X180CM (WIRE) IMPLANT

## 2022-03-31 NOTE — ED Notes (Signed)
ED TO INPATIENT HANDOFF REPORT  ED Nurse Name and Phone #: Aleah Ahlgrim RN [phone]  S Name/Age/Gender Rodney Farley 88 y.o. male Room/Bed: 041C/041C  Code Status   Code Status: Full Code  Home/SNF/Other Home Patient oriented to: self, place, time, and situation Is this baseline? Yes   Triage Complete: Triage complete  Chief Complaint Unstable angina ([place]) [I20.0]  Triage Note Sent from dialysis due to CP.  Dialysis left needle in right arm graft.  Reports angina intermittently.  Patient has been taking nitro to help with pain.  Today had 3 episodes today and has taken nitro.  At dialysis started having tremors.    Allergies Allergies  Allergen Reactions   Bee Venom Anaphylaxis   Lyrica [Pregabalin] Other (See Comments)    hallucinations   Prednisone Other (See Comments)    hallucinations   Zocor [Simvastatin] Nausea Only and Other (See Comments)    Headache with brand name only.  Can take the generic.    Level of Care/Admitting Diagnosis ED Disposition     ED Disposition  Admit   Condition  --   [place]: [place] [100100]  Level of Care: Telemetry Medical [104]  May place patient in observation at [facility] or [place] if equivalent level of care is available:: No  Covid Evaluation: Asymptomatic - no recent exposure (last 10 days) testing not required  Diagnosis: Unstable angina [facility]) [[id number]]  Admitting Physician: Edwin Dada [[id number]]  Attending Physician: Edwin Dada [[id number]]          B Medical/Surgery History Past Medical History:  Diagnosis Date   AICD (automatic cardioverter/defibrillator) present    [employer] pacer   Anemia    Anxiety and depression    Arthritis    Bell's palsy    left side. after shingles episode   BPH associated with nocturia    Chronic systolic CHF (congestive heart failure) ([place])    EF normalized by Echo 2019   COPD (chronic obstructive pulmonary disease)  ([place])    Severe   Coronary artery disease    a. s/p MI in 1994/1995 while in [place] s/p questionable PCI. 03/2015: progression of disease, for staged PCI.   Diabetic peripheral neuropathy (HCC)    GERD (gastroesophageal reflux disease)    Gout    Hard of hearing    B/L   History of chronic pancreatitis 07/23/2017   noted on CT abd/pelvis   History of shingles    Hypercholesterolemia    Hypertension    Obesity    Sleep apnea    "sleeps w/humidifyer when he panics and gets short of breath" (04/08/2015)   TIA (transient ischemic attack) X 3   Trigger middle finger of left hand    Type II diabetes mellitus ([place])    Wears glasses    Wears hearing aid    Past Surgical History:  Procedure Laterality Date   APPENDECTOMY     AV FISTULA PLACEMENT Right 02/07/2019   Procedure: ARTERIOVENOUS (AV) FISTULA CREATION RIGHT UPPER ARM;  Surgeon: Waynetta Sandy, MD;  Location: [place];  Service: Vascular;  Laterality: Right;   BIV ICD GENERATOR CHANGEOUT N/A 10/09/2021   Procedure: BIV ICD GENERATOR CHANGEOUT;  Surgeon: Deboraha Sprang, MD;  Location: [place] [facility];  Service: Cardiovascular;  Laterality: N/A;   CARDIAC CATHETERIZATION N/A 03/29/2015   Procedure: Right/Left Heart Cath and Coronary Angiography;  Surgeon: Rhian M [name], MD;  Location: [place] [facility];  Service: Cardiovascular;  Laterality: N/A;   [place]   "after my MI; put me on heart RX after cath"   CARDIAC CATHETERIZATION N/A 04/09/2015   Procedure: Coronary Stent Intervention;  Surgeon: Ladarrius M [name], MD;  Location: [place] [facility];  Service: Cardiovascular;  Laterality: N/A;   CATARACT EXTRACTION W/ INTRAOCULAR LENS  IMPLANT, BILATERAL     CHOLECYSTECTOMY N/A 10/13/2017   Procedure: LAPAROSCOPIC CHOLECYSTECTOMY WITH LYSIS OF ADHESIONS;  Surgeon: Ileana Roup, MD;  Location: [facility];  Service: General;  Laterality: N/A;   COLONOSCOPY     CORONARY ANGIOGRAPHY N/A 11/12/2021    Procedure: CORONARY ANGIOGRAPHY;  Surgeon: Wellington Hampshire, MD;  Location: [place] [facility];  Service: Cardiovascular;  Laterality: N/A;   CORONARY ANGIOGRAPHY N/A 12/24/2021   Procedure: CORONARY ANGIOGRAPHY;  Surgeon: Troy Sine, MD;  Location: [place] [facility];  Service: Cardiovascular;  Laterality: N/A;   CORONARY BALLOON ANGIOPLASTY N/A 12/24/2021   Procedure: CORONARY BALLOON ANGIOPLASTY;  Surgeon: Troy Sine, MD;  Location: [place] [facility];  Service: Cardiovascular;  Laterality: N/A;   CORONARY BALLOON ANGIOPLASTY N/A 02/09/2022   Procedure: CORONARY BALLOON ANGIOPLASTY;  Surgeon: Nelva Bush, MD;  Location: [place] [facility];  Service: Cardiovascular;  Laterality: N/A;  LAD   CORONARY STENT INTERVENTION N/A 11/12/2021   Procedure: CORONARY STENT INTERVENTION;  Surgeon: Wellington Hampshire, MD;  Location: [place] [facility];  Service: Cardiovascular;  Laterality: N/A;  LAD   DENTAL SURGERY     EP IMPLANTABLE DEVICE N/A 09/23/2015   MDT CRT-D, Dr. Caryl Comes   HIATAL HERNIA REPAIR  1977   ILEOCECETOMY N/A 03/27/2017   Procedure: ILEOCECECTOMY;  Surgeon: Ileana Roup, MD;  Location: [place];  Service: General;  Laterality: N/A;   INSERT / REPLACE / REMOVE PACEMAKER  07/2008   Complete heart block status post DDD with good function   INTRAVASCULAR IMAGING/OCT N/A 12/24/2021   Procedure: INTRAVASCULAR IMAGING/OCT;  Surgeon: Troy Sine, MD;  Location: [place] [facility];  Service: Cardiovascular;  Laterality: N/A;   INTRAVASCULAR LITHOTRIPSY  02/09/2022   Procedure: INTRAVASCULAR LITHOTRIPSY;  Surgeon: Nelva Bush, MD;  Location: [place] [facility];  Service: Cardiovascular;;  LAD   INTRAVASCULAR ULTRASOUND/IVUS N/A 02/09/2022   Procedure: Intravascular Ultrasound/IVUS;  Surgeon: Nelva Bush, MD;  Location: [place] [facility];  Service: Cardiovascular;  Laterality: N/A;  LAD   IR FLUORO GUIDE CV LINE RIGHT  04/03/2019   IR US GUIDE VASC ACCESS RIGHT  04/03/2019    LAPAROTOMY N/A 03/27/2017   Procedure: EXPLORATORY LAPAROTOMY;  Surgeon: Ileana Roup, MD;  Location: [facility];  Service: General;  Laterality: N/A;   RIGHT HEART CATH AND CORONARY ANGIOGRAPHY N/A 02/09/2022   Procedure: RIGHT HEART CATH AND CORONARY ANGIOGRAPHY;  Surgeon: Nelva Bush, MD;  Location: [place] [facility];  Service: Cardiovascular;  Laterality: N/A;   TONSILLECTOMY     UPPER GI ENDOSCOPY       A IV Location/Drains/Wounds Patient Lines/Drains/Airways Status     Active Line/Drains/Airways     Name Placement date Placement time Site Days   Peripheral IV 03/30/22 20 G Left Antecubital 03/30/22  1500  Antecubital  1   Fistula / Graft Right Upper arm Arteriovenous fistula 02/07/19  0753  Upper arm  1148            Intake/Output Last 24 hours No intake or output data in the 24 hours ending 03/31/22 1233  Labs/Imaging Results for  orders placed or performed during the hospital encounter of 03/30/22 (from the past 48 hour(s))  CBC with Differential     Status: Abnormal   Collection Time: 03/30/22  2:38 PM  Result Value Ref Range   WBC 7.9 4.0 - 10.5 K/uL   RBC 3.05 (L) 4.22 - 5.81 MIL/uL   Hemoglobin 10.6 (L) 13.0 - 17.0 g/dL   HCT 32.1 (L) 39.0 - 52.0 %   MCV 105.2 (H) 80.0 - 100.0 fL   MCH 34.8 (H) 26.0 - 34.0 pg   MCHC 33.0 30.0 - 36.0 g/dL   RDW 14.9 11.5 - 15.5 %   Platelets 126 (L) 150 - 400 K/uL   nRBC 0.0 0.0 - 0.2 %   Neutrophils Relative % 61 %   Neutro Abs 4.8 1.7 - 7.7 K/uL   Lymphocytes Relative 28 %   Lymphs Abs 2.2 0.7 - 4.0 K/uL   Monocytes Relative 8 %   Monocytes Absolute 0.6 0.1 - 1.0 K/uL   Eosinophils Relative 2 %   Eosinophils Absolute 0.2 0.0 - 0.5 K/uL   Basophils Relative 1 %   Basophils Absolute 0.0 0.0 - 0.1 K/uL   Immature Granulocytes 0 %   Abs Immature Granulocytes 0.03 0.00 - 0.07 K/uL    Comment: Performed at [facility], [address], [place], [place] [address]  Basic metabolic panel     Status: Abnormal    Collection Time: 03/30/22  2:38 PM  Result Value Ref Range   Sodium 140 135 - 145 mmol/L   Potassium 5.1 3.5 - 5.1 mmol/L   Chloride 97 (L) 98 - 111 mmol/L   CO2 29 22 - 32 mmol/L   Glucose, Bld 152 (H) 70 - 99 mg/dL    Comment: Glucose reference range applies only to samples taken after fasting for at least 8 hours.   BUN 34 (H) 8 - 23 mg/dL   Creatinine, Ser 8.66 (H) 0.61 - 1.24 mg/dL   Calcium 9.5 8.9 - 10.3 mg/dL   GFR, Estimated 5 (L) >60 mL/min    Comment: (NOTE) Calculated using the CKD-EPI Creatinine Equation (2021)    Anion gap 14 5 - 15    Comment: Performed at [facility] [address], [place], [place] [address]  Troponin I (High Sensitivity)     Status: Abnormal   Collection Time: 03/30/22  2:38 PM  Result Value Ref Range   Troponin I (High Sensitivity) 54 (H) <18 ng/L    Comment: (NOTE) Elevated high sensitivity troponin I (hsTnI) values and significant  changes across serial measurements may suggest ACS but many other  chronic and acute conditions are known to elevate hsTnI results.  Refer to the "Links" section for chest pain algorithms and additional  guidance. Performed at [facility], [place] [address], [place], [place] [address]   Hepatic function panel     Status: None   Collection Time: 03/30/22  2:38 PM  Result Value Ref Range   Total Protein 6.9 6.5 - 8.1 g/dL   Albumin 3.8 3.5 - 5.0 g/dL   AST 23 15 - 41 U/L   ALT 25 0 - 44 U/L   Alkaline Phosphatase 77 38 - 126 U/L   Total Bilirubin 0.7 0.3 - 1.2 mg/dL   Bilirubin, Direct 0.1 0.0 - 0.2 mg/dL   Indirect Bilirubin 0.6 0.3 - 0.9 mg/dL    Comment: Performed at [facility] [address] [place], [place] [address]  Magnesium     Status: None   Collection Time: 03/30/22  2:38 PM  Result Value Ref Range   Magnesium 2.0 1.7 - 2.4 mg/dL    Comment: Performed at [facility], [place] [address] [place], [place] [address]  CBG monitoring, ED     Status: None   Collection Time:  03/30/22  9:06 PM  Result Value Ref Range   Glucose-Capillary 92 70 - 99 mg/dL    Comment: Glucose reference range applies only to samples taken after fasting for at least 8 hours.  Troponin I (High Sensitivity)     Status: Abnormal   Collection Time: 03/30/22  9:08 PM  Result Value Ref Range   Troponin I (High Sensitivity) 57 (H) <18 ng/L    Comment: (NOTE) Elevated high sensitivity troponin I (hsTnI) values and significant  changes across serial measurements may suggest ACS but many other  chronic and acute conditions are known to elevate hsTnI results.  Refer to the "Links" section for chest pain algorithms and additional  guidance. Performed at [facility], [place] [address], [place], [place] [address]   Vitamin B12     Status: Abnormal   Collection Time: 03/30/22  9:08 PM  Result Value Ref Range   Vitamin B-12 1,446 (H) 180 - 914 pg/mL    Comment: (NOTE) This assay is not validated for testing neonatal or myeloproliferative syndrome specimens for Vitamin B12 levels. Performed at [facility], [place] [address], [place], [place] [address]   Iron and TIBC     Status: Abnormal   Collection Time: 03/30/22  9:08 PM  Result Value Ref Range   Iron 63 45 - 182 ug/dL   TIBC 238 (L) 250 - 450 ug/dL   Saturation Ratios 27 17.9 - 39.5 %   UIBC 175 ug/dL    Comment: Performed at [facility] [address] [place], [place] [address]  Ferritin     Status: Abnormal   Collection Time: 03/30/22  9:08 PM  Result Value Ref Range   Ferritin 1,330 (H) 24 - 336 ng/mL    Comment: Performed at [facility] [address] [place], [place] [address]  Reticulocytes     Status: Abnormal   Collection Time: 03/30/22  9:08 PM  Result Value Ref Range   Retic Ct Pct 1.9 0.4 - 3.1 %   RBC. 2.78 (L) 4.22 - 5.81 MIL/uL   Retic Count, Absolute 52.8 19.0 - 186.0 K/uL   Immature Retic Fract 13.6 2.3 - 15.9 %    Comment: Performed at [facility] [address],  [place], [place] [address]  Folate     Status: None   Collection Time: 03/30/22  9:08 PM  Result Value Ref Range   Folate 25.4 >5.9 ng/mL    Comment: Performed at [facility], [place] [address] [place], [place] [address]  Troponin I (High Sensitivity)     Status: Abnormal   Collection Time: 03/30/22 11:32 PM  Result Value Ref Range   Troponin I (High Sensitivity) 59 (H) <18 ng/L    Comment: (NOTE) Elevated high sensitivity troponin I (hsTnI) values and significant  changes across serial measurements may suggest ACS but many other  chronic and acute conditions are known to elevate hsTnI results.  Refer to the "Links" section for chest pain algorithms and additional  guidance. Performed at [facility], [place] [address], [place], [place] [address]   CBG  monitoring, ED     Status: Abnormal   Collection Time: 03/30/22 11:35 PM  Result Value Ref Range   Glucose-Capillary 160 (H) 70 - 99 mg/dL    Comment: Glucose reference range applies only to samples taken after fasting for at least 8 hours.  Comprehensive metabolic panel     Status: Abnormal   Collection Time: 03/31/22 12:00 AM  Result Value Ref Range   Sodium 140 135 - 145 mmol/L   Potassium 5.3 (H) 3.5 - 5.1 mmol/L   Chloride 100 98 - 111 mmol/L   CO2 28 22 - 32 mmol/L   Glucose, Bld 151 (H) 70 - 99 mg/dL    Comment: Glucose reference range applies only to samples taken after fasting for at least 8 hours.   BUN 37 (H) 8 - 23 mg/dL   Creatinine, Ser 8.97 (H) 0.61 - 1.24 mg/dL   Calcium 9.0 8.9 - 10.3 mg/dL   Total Protein 5.9 (L) 6.5 - 8.1 g/dL   Albumin 3.3 (L) 3.5 - 5.0 g/dL   AST 18 15 - 41 U/L   ALT 22 0 - 44 U/L   Alkaline Phosphatase 67 38 - 126 U/L   Total Bilirubin 0.8 0.3 - 1.2 mg/dL   GFR, Estimated 5 (L) >60 mL/min    Comment: (NOTE) Calculated using the CKD-EPI Creatinine Equation (2021)    Anion gap 12 5 - 15    Comment: Performed at [facility], [place] [address] [place], [place] [address]  CBC      Status: Abnormal   Collection Time: 03/31/22 12:00 AM  Result Value Ref Range   WBC 5.8 4.0 - 10.5 K/uL   RBC 2.64 (L) 4.22 - 5.81 MIL/uL   Hemoglobin 9.3 (L) 13.0 - 17.0 g/dL   HCT 28.8 (L) 39.0 - 52.0 %   MCV 109.1 (H) 80.0 - 100.0 fL   MCH 35.2 (H) 26.0 - 34.0 pg   MCHC 32.3 30.0 - 36.0 g/dL   RDW 14.9 11.5 - 15.5 %   Platelets 117 (L) 150 - 400 K/uL   nRBC 0.0 0.0 - 0.2 %    Comment: Performed at [facility] [address] [place], [place] [address]  CBG monitoring, ED     Status: Abnormal   Collection Time: 03/31/22  4:08 AM  Result Value Ref Range   Glucose-Capillary 128 (H) 70 - 99 mg/dL    Comment: Glucose reference range applies only to samples taken after fasting for at least 8 hours.   Comment 1 Notify RN    Comment 2 Document in Chart   CBG monitoring, ED     Status: None   Collection Time: 03/31/22  8:02 AM  Result Value Ref Range   Glucose-Capillary 90 70 - 99 mg/dL    Comment: Glucose reference range applies only to samples taken after fasting for at least 8 hours.   *Note: Due to a large number of results and/or encounters for the requested time period, some results have not been displayed. A complete set of results can be found in Results Review.   Overnight EEG with video  Result Date: 03/31/2022 Lora Havens, MD     03/31/2022  9:37 AM Patient Name: Rodney Farley MRN: [id number] Epilepsy Attending: Lora Havens Referring Physician/Provider: Amie Portland, MD Duration: 03/31/2022 0325 to 0930  Patient history: 88yo M with seizure like activity. EEG to evaluate for seizure  Level of alertness: Awake, asleep  AEDs during EEG  study: None  Technical aspects: This EEG study was done with scalp electrodes positioned according to the 10-20 International system of electrode placement. Electrical activity was reviewed with band pass filter of 1-70Hz , sensitivity of 7 uV/mm, display speed of 19mm/sec with a 60Hz  notched filter applied as appropriate. EEG  data were recorded continuously and digitally stored.  Video monitoring was available and reviewed as appropriate.  Description: The posterior dominant rhythm consists of 9 Hz activity of moderate voltage (25-35 uV) seen predominantly in posterior head regions, symmetric and reactive to eye opening and eye closing. Sleep was characterized by vertex waves, sleep spindles (12-14hz ), maximal fronto-central region. Hyperventilation and photic stimulation were not performed.    IMPRESSION: This study is within normal limits. No seizures or epileptiform discharges were seen throughout the recording.  Lora Havens    EEG adult  Result Date: 03/30/2022 Lora Havens, MD     03/30/2022  6:58 PM Patient Name: Traycen Goyer MRN: [id number] Epilepsy Attending: Lora Havens Referring Physician/Provider: Kerney Elbe, MD Date: 03/30/2022 Duration: 23.33 mins Patient history: 88yo M with seizure like activity. EEG to evaluate for seizure Level of alertness: Awake AEDs during EEG study: None Technical aspects: This EEG study was done with scalp electrodes positioned according to the 10-20 International system of electrode placement. Electrical activity was reviewed with band pass filter of 1-70Hz , sensitivity of 7 uV/mm, display speed of 55mm/sec with a 60Hz  notched filter applied as appropriate. EEG data were recorded continuously and digitally stored.  Video monitoring was available and reviewed as appropriate. Description: The posterior dominant rhythm consists of 9 Hz activity of moderate voltage (25-35 uV) seen predominantly in posterior head regions, symmetric and reactive to eye opening and eye closing. Patient was noted to have episodes of bilateral lower extremity twitching. Concomitant EEG before, during and after the event showed normal posterior dominant rhythm.  Hyperventilation and photic stimulation were not performed.   IMPRESSION: This study is within normal limits. No seizures or epileptiform  discharges were seen throughout the recording. Patient was noted to have episodes of bilateral lower extremity twitching without concomitant EEG change. These episodes were most likely NOT epileptic Lora Havens   DG Chest Port 1 View  Result Date: 03/30/2022 CLINICAL DATA:  Chest pain during hemodialysis. EXAM: PORTABLE CHEST 1 VIEW COMPARISON:  Radiographs 02/08/2022 and 01/21/2022.  CT 02/03/2022. FINDINGS: 1624 hours. Multiple left subclavian AICD leads are grossly stable. The heart is enlarged. There is aortic atherosclerosis and diffuse pulmonary interstitial prominence, similar to the most recent study. No confluent airspace opacity, pneumothorax or significant pleural effusion. The bones appear unremarkable. Several telemetry leads overlie the chest. IMPRESSION: Stable cardiomegaly and diffuse interstitial pulmonary prominence which may reflect mild edema. Electronically Signed   By: Richardean Sale M.D.   On: 03/30/2022 16:43   CT Head Wo Contrast  Result Date: 03/30/2022 CLINICAL DATA:  New onset seizure, altered level of consciousness EXAM: CT HEAD WITHOUT CONTRAST TECHNIQUE: Contiguous axial images were obtained from the base of the skull through the vertex without intravenous contrast. RADIATION DOSE REDUCTION: This exam was performed according to the departmental dose-optimization program which includes automated exposure control, adjustment of the mA and/or kV according to patient size and/or use of iterative reconstruction technique. COMPARISON:  04/01/2019 FINDINGS: Brain: Stable hypodensities throughout the periventricular white matter and basal ganglia, consistent with chronic small vessel ischemic change. No signs of acute infarct or hemorrhage. The lateral ventricles and remaining midline structures are unremarkable. No  acute extra-axial fluid collections. No mass effect. Vascular: Stable atherosclerosis.  No hyperdense vessel. Skull: Normal. Negative for fracture or focal lesion.  Sinuses/Orbits: Stable postsurgical changes bilateral globes. Otherwise the orbits and paranasal sinuses are unremarkable. Other: None. IMPRESSION: 1. Stable head CT, no acute intracranial process. Electronically Signed   By: Randa Ngo M.D.   On: 03/30/2022 16:14    Pending Labs Unresulted Labs (From admission, onward)     Start     Ordered   03/31/22 1020  Hepatitis B surface antigen  (New Admission Hemo Labs (Hepatitis B))  Once,   R        03/31/22 1021   03/31/22 1020  Hepatitis B surface antibody  (New Admission Hemo Labs (Hepatitis B))  Once,   R        03/31/22 1021   03/31/22 1020  Hepatitis B surface antibody,quantitative  (New Admission Hemo Labs (Hepatitis B))  Once,   R        03/31/22 1021   03/31/22 1020  Hepatitis B core antibody, total  (New Admission Hemo Labs (Hepatitis B))  Once,   R        03/31/22 1021   03/31/22 1020  Hepatitis C antibody  (New Admission Hemo Labs (Hepatitis B))  Once,   R        03/31/22 1021            Vitals/Pain Today's Vitals   03/31/22 1200 03/31/22 1205 03/31/22 1215 03/31/22 1223  BP: (!) 143/70     Pulse: 79 86 77   Resp: (!) 31 18 14    Temp:    98 F (36.7 C)  TempSrc:    Oral  SpO2: 95% 93% 94%   Weight:      Height:      PainSc:        Isolation Precautions No active isolations  Medications Medications  fentaNYL (SUBLIMAZE) injection 50 mcg (50 mcg Intravenous Patient Refused/Not Given 03/30/22 2111)  nitroGLYCERIN (NITROSTAT) SL tablet 0.4 mg (0.4 mg Sublingual Given 03/31/22 1032)  insulin aspart (novoLOG) injection 0-6 Units ( Subcutaneous Not Given 03/31/22 0803)  atorvastatin (LIPITOR) tablet 80 mg (80 mg Oral Given 03/31/22 0950)  aspirin EC tablet 81 mg (81 mg Oral Given 03/31/22 0951)  gabapentin (NEURONTIN) capsule 300 mg (300 mg Oral Given 03/31/22 0951)  midodrine (PROAMATINE) tablet 5-10 mg (has no administration in time range)  pantoprazole (PROTONIX) EC tablet 40 mg (40 mg Oral Given 03/31/22 0951)   acetaminophen (TYLENOL) tablet 650 mg (has no administration in time range)    Or  acetaminophen (TYLENOL) suppository 650 mg (has no administration in time range)  HYDROcodone-acetaminophen (NORCO/VICODIN) 5-325 MG per tablet 1-2 tablet (has no administration in time range)  sodium chloride flush (NS) 0.9 % injection 3 mL (3 mLs Intravenous Not Given 03/31/22 0949)  sodium chloride flush (NS) 0.9 % injection 3 mL (has no administration in time range)  0.9 %  sodium chloride infusion (has no administration in time range)  albuterol (PROVENTIL) (2.5 MG/3ML) 0.083% nebulizer solution 2.5 mg (has no administration in time range)  clopidogrel (PLAVIX) tablet 75 mg (75 mg Oral Given 03/31/22 0816)  metoCLOPramide (REGLAN) injection 5 mg (5 mg Intravenous Given 03/31/22 0040)  sevelamer carbonate (RENVELA) tablet 1,600 mg (1,600 mg Oral Not Given 03/31/22 1206)  sevelamer carbonate (RENVELA) tablet 800 mg (has no administration in time range)  0.9 %  sodium chloride infusion ( Intravenous New Bag/Given 03/31/22 0511)  Chlorhexidine Gluconate  Cloth 2 % PADS 6 each (has no administration in time range)  aspirin chewable tablet 81 mg (81 mg Oral Given 03/31/22 0507)    Mobility walks with device Moderate fall risk   Focused Assessments Cardiac Assessment Handoff:    Lab Results  Component Value Date   CKTOTAL 95 05/08/2015   CKMB 3.1 04/07/2010   TROPONINI 1.07 ([place]) 10/15/2017   No results found for: "DDIMER" Does the Patient currently have chest pain? Yes   , Neuro Assessment Handoff:  Swallow screen pass? Yes          Neuro Assessment:   Neuro Checks:      Last Documented NIHSS Modified Score:   Has TPA been given? No If patient is a Neuro Trauma and patient is going to OR before floor call report to [place] nurse: [phone] or [phone]  , Renal Assessment Handoff:  Hemodialysis Schedule: M/W/F Last Hemodialysis date and time: 03/27/22 (Full session) 03/30/22 went to  dialysis and was only able to complete about 30 minutes of session.    Restricted appendage: right arm  , Pulmonary Assessment Handoff:  Lung sounds:   O2 Device: Room Air      R Recommendations: See Admitting Provider Note  Report given to:   Additional Notes: Patient is waiting to go to cath lab. Per admitting team they would like to start patient on nitro drip since he continues to have active chest pain. Patient is a/o x4.

## 2022-03-31 NOTE — ED Notes (Signed)
Pt complaining of cp after working with physical therapy in the bed. Pt only rolled per physical therapy. But he started having CP. Gave pt nitro.

## 2022-03-31 NOTE — Consult Note (Signed)
Renal Service Consult Note [facility]  Kieon Dixon Buehrer 03/31/2022 Robert D Schertz, MD Requesting Physician: Dr. Danford  Reason for Consult: ESRD pt w/ chest pain HPI: The patient is a 88 y.o. year-old w/ hx of CAD, chronic syst CHF, sp AICD, COPD, gout, HOH, DM2, ESRD on HD and HTN  who presented w/ worsening chest pains over the last several days. Had CP on HD yest and only got 30 min of HD. CP better here. In ED BP stable in 130/80 range. On RA at 96%. CXR showing mild IS edema. Pt admitted and seen by cardiology, plan is for LHC today. We are asked to see for dialysis.  Pt seen in ED.  No distress, nasal O2. No abd pain or CP at this time. Last HD was last Friday on 9/15. Has not missed HD except yest (in ED).   ROS - denies CP, no joint pain, no HA, no blurry vision, no rash, no diarrhea, no nausea/ vomiting, no dysuria, no difficulty voiding   Past Medical History  Past Medical History:  Diagnosis Date   AICD (automatic cardioverter/defibrillator) present    [employer] pacer   Anemia    Anxiety and depression    Arthritis    Bell's palsy    left side. after shingles episode   BPH associated with nocturia    Chronic systolic CHF (congestive heart failure) (HCC)    EF normalized by Echo 2019   COPD (chronic obstructive pulmonary disease) (HCC)    Severe   Coronary artery disease    a. s/p MI in 1994/1995 while in [place] s/p questionable PCI. 03/2015: progression of disease, for staged PCI.   Diabetic peripheral neuropathy (HCC)    GERD (gastroesophageal reflux disease)    Gout    Hard of hearing    B/L   History of chronic pancreatitis 07/23/2017   noted on CT abd/pelvis   History of shingles    Hypercholesterolemia    Hypertension    Obesity    Sleep apnea    "sleeps w/humidifyer when he panics and gets short of breath" (04/08/2015)   TIA (transient ischemic attack) X 3   Trigger middle finger of left hand    Type II diabetes mellitus (HCC)     Wears glasses    Wears hearing aid    Past Surgical History  Past Surgical History:  Procedure Laterality Date   APPENDECTOMY     AV FISTULA PLACEMENT Right 02/07/2019   Procedure: ARTERIOVENOUS (AV) FISTULA CREATION RIGHT UPPER ARM;  Surgeon: Cain, Brandon Christopher, MD;  Location: [facility];  Service: Vascular;  Laterality: Right;   BIV ICD GENERATOR CHANGEOUT N/A 10/09/2021   Procedure: BIV ICD GENERATOR CHANGEOUT;  Surgeon: Klein, Steven C, MD;  Location: [facility];  Service: Cardiovascular;  Laterality: N/A;   CARDIAC CATHETERIZATION N/A 03/29/2015   Procedure: Right/Left Heart Cath and Coronary Angiography;  Surgeon: Sahid M Jordan, MD;  Location: [facility];  Service: Cardiovascular;  Laterality: N/A;   CARDIAC CATHETERIZATION  1995   "after my MI; put me on heart RX after cath"   CARDIAC CATHETERIZATION N/A 04/09/2015   Procedure: Coronary Stent Intervention;  Surgeon: Adriane M Jordan, MD;  Location: [facility];  Service: Cardiovascular;  Laterality: N/A;   CATARACT EXTRACTION W/ INTRAOCULAR LENS  IMPLANT, BILATERAL     CHOLECYSTECTOMY N/A 10/13/2017   Procedure: LAPAROSCOPIC CHOLECYSTECTOMY WITH LYSIS OF ADHESIONS;  Surgeon: White, Christopher M, MD;  Location: [facility];    Service: General;  Laterality: N/A;   COLONOSCOPY     CORONARY ANGIOGRAPHY N/A 11/12/2021   Procedure: CORONARY ANGIOGRAPHY;  Surgeon: Arida, Muhammad A, MD;  [facility];  Service: Cardiovascular;  Laterality: N/A;   CORONARY ANGIOGRAPHY N/A 12/24/2021   Procedure: CORONARY ANGIOGRAPHY;  Surgeon: Kelly, Thomas A, MD;  [facility];  Service: Cardiovascular;  Laterality: N/A;   CORONARY BALLOON ANGIOPLASTY N/A 12/24/2021   Procedure: CORONARY BALLOON ANGIOPLASTY;  Surgeon: Kelly, Thomas A, MD;  [facility];  Service: Cardiovascular;  Laterality: N/A;   CORONARY BALLOON ANGIOPLASTY N/A 02/09/2022   Procedure: CORONARY BALLOON ANGIOPLASTY;  Surgeon:  End, Christopher, MD;  [facility];  Service: Cardiovascular;  Laterality: N/A;  LAD   CORONARY STENT INTERVENTION N/A 11/12/2021   Procedure: CORONARY STENT INTERVENTION;  Surgeon: Arida, Muhammad A, MD;  [facility];  Service: Cardiovascular;  Laterality: N/A;  LAD   DENTAL SURGERY     EP IMPLANTABLE DEVICE N/A 09/23/2015   MDT CRT-D, Dr. Klein   HIATAL HERNIA REPAIR  1977   ILEOCECETOMY N/A 03/27/2017   Procedure: ILEOCECECTOMY;  Surgeon: White, Christopher M, MD;  Location: [facility];  Service: General;  Laterality: N/A;   INSERT / REPLACE / REMOVE PACEMAKER  07/2008   Complete heart block status post DDD with good function   INTRAVASCULAR IMAGING/OCT N/A 12/24/2021   Procedure: INTRAVASCULAR IMAGING/OCT;  Surgeon: Kelly, Thomas A, MD;  [facility];  Service: Cardiovascular;  Laterality: N/A;   INTRAVASCULAR LITHOTRIPSY  02/09/2022   Procedure: INTRAVASCULAR LITHOTRIPSY;  Surgeon: End, Christopher, MD;  [facility];  Service: Cardiovascular;;  LAD   INTRAVASCULAR ULTRASOUND/IVUS N/A 02/09/2022   Procedure: Intravascular Ultrasound/IVUS;  Surgeon: End, Christopher, MD;  [facility];  Service: Cardiovascular;  Laterality: N/A;  LAD   IR FLUORO GUIDE CV LINE RIGHT  04/03/2019   IR US GUIDE VASC ACCESS RIGHT  04/03/2019   LAPAROTOMY N/A 03/27/2017   Procedure: EXPLORATORY LAPAROTOMY;  Surgeon: White, Christopher M, MD;  Location: [facility];  Service: General;  Laterality: N/A;   RIGHT HEART CATH AND CORONARY ANGIOGRAPHY N/A 02/09/2022   Procedure: RIGHT HEART CATH AND CORONARY ANGIOGRAPHY;  Surgeon: End, Christopher, MD;  [facility];  Service: Cardiovascular;  Laterality: N/A;   TONSILLECTOMY     UPPER GI ENDOSCOPY     Family History  Family History  Problem Relation Age of Onset   Stroke Mother    Leukemia Father    Stroke Sister    Heart attack Brother    Social History  reports that he quit smoking  about 14 years ago. His smoking use included cigarettes. He has a 81.00 pack-year smoking history. He has never been exposed to tobacco smoke. He has never used smokeless tobacco. He reports current alcohol use. He reports that he does not use drugs. Allergies  Allergies  Allergen Reactions   Bee Venom Anaphylaxis   Lyrica [Pregabalin] Other (See Comments)    hallucinations   Prednisone Other (See Comments)    hallucinations   Zocor [Simvastatin] Nausea Only and Other (See Comments)    Headache with brand name only.  Can take the generic.   Home medications Prior to Admission medications   Medication Sig Start Date End Date Taking? Authorizing Provider  aspirin EC 81 MG tablet Take 81 mg by mouth daily.   Yes [provider]  atorvastatin (LIPITOR)   80 MG tablet Take 1 tablet by mouth daily. 11/14/21  Yes Johnson, Kathleen R, PA-C  calcitRIOL (ROCALTROL) 0.25 MCG capsule Take 7 capsules (1.75 mcg total) by mouth every Monday, Wednesday, and Friday with hemodialysis. 01/26/22  Yes Jordan, Craigory M, MD  carbamide peroxide (DEBROX) 6.5 % OTIC solution Place 5 drops into both ears 2 (two) times daily as needed (earwax). 11/13/21  Yes Crenshaw, Brian S, MD  Carboxymethylcellul-Glycerin (LUBRICATING EYE DROPS OP) Place 1 drop into both eyes 3 (three) times daily as needed (dry eyes).   Yes [provider]  clopidogrel (PLAVIX) 75 MG tablet Take 1 tablet (75 mg total) by mouth daily with breakfast. 11/14/21  Yes Johnson, Kathleen R, PA-C  gabapentin (NEURONTIN) 300 MG capsule Take 1 capsule (300 mg total) by mouth 2 (two) times daily. 03/16/22  Yes Crenshaw, Brian S, MD  Methoxy PEG-Epoetin Beta (MIRCERA IJ) as directed. 02/26/21 07/15/22 Yes [provider]  midodrine (PROAMATINE) 10 MG tablet Take 1 tablet (10 mg) three times a week. Add 5 mg a night after dialysis as needed for low blood pressure. Patient taking differently: Take 5-10 mg by mouth See admin instructions. Take 1 tablet  (10 mg) three times a week. Add 5 mg a night after dialysis as needed for low blood pressure. 12/03/21  Yes Goodrich, Callie E, PA-C  multivitamin (RENA-VIT) TABS tablet Take 1 tablet by mouth daily. 08/22/21  Yes Hunter, Stephen O, MD  nitroGLYCERIN (NITROSTAT) 0.4 MG SL tablet Place 1 tablet under the tongue every 5 minutes x 3 doses as needed for chest pain. 11/13/21  Yes Johnson, Kathleen R, PA-C  oxymetazoline (AFRIN) 0.05 % nasal spray Place 1 spray into both nostrils 2 (two) times daily as needed for congestion. 12/29/21  Yes Hunter, Stephen O, MD  pantoprazole (PROTONIX) 40 MG tablet Take 1 tablet by mouth daily. 11/14/21  Yes Johnson, Kathleen R, PA-C  saccharomyces boulardii (FLORASTOR) 250 MG capsule Take 1 capsule (250 mg total) by mouth 2 (two) times daily. 04/08/19  Yes Regalado, Belkys A, MD  sevelamer carbonate (RENVELA) 800 MG tablet Take 2 tablets (1,600 mg total) by mouth 3 (three) times daily with meals. Patient taking differently: Take 1,600 mg by mouth See admin instructions. Take 2 tablets by mouth three times daily with meals and 1 tablet with snacks 11/13/21  Yes Crenshaw, Brian S, MD  vitamin B-12 (CYANOCOBALAMIN) 1000 MCG tablet Take 1,000 mcg by mouth daily.   Yes [provider]  glucose blood (FREESTYLE TEST STRIPS) test strip Use to check blood sugar daily 05/01/19   Hunter, Stephen O, MD  glucose monitoring kit (FREESTYLE) monitoring kit USE TO MONITOR BLOOD GLUCOSE AS DIRECTED 05/01/19   Hunter, Stephen O, MD     Vitals:   03/31/22 1223 03/31/22 1230 03/31/22 1250 03/31/22 1255  BP:  97/62 (!) 99/56   Pulse:  77 80 80  Resp:  20 (!) 24 (!) 23  Temp: 98 F (36.7 C)     TempSrc: Oral     SpO2:  96% 99% 95%  Weight:      Height:       Exam Gen alert, no distress No rash, cyanosis or gangrene Sclera anicteric, throat clear  No jvd or bruits Chest clear bilat to bases, no rales/ wheezing RRR no MRG Abd soft ntnd no mass or ascites +bs GU normal male MS no  joint effusions or deformity Ext no LE or UE edema, no wounds or ulcers Neuro is alert, Ox   3 , nf    RUA AVG+ bruit      Home meds include - aspirin, atorvastatin, clopidogrel, gabapentin, midodrine 78m pre hd tiw, renavite, sl ntg prn, pantoprazole, sevelamer carbonate, vits/ supps/ prns     OP HD: MWF [facility]  - needs updating > 4h  400/500  2/2 bath  P2  Heparin 2500+ 10062mrun  RUA AVG   Assessment/ Plan: Chest pain / hx CAD sp multiple stents/ interventions - [facility]  [facility] - [facility] HD M[facility]. Has 30 min HD yest, plan HD after LHC today.  HTN/ vol - BP's are low normal, on ntg gtt possibly.  ICM - hx EF 40-45% Hypotension - gets pre HD midodrine 1045miw  Hx CHB - sp ICD-CRT Seizure-like activity - being seen by neurology, EEG leads in place Anemia esrd - Hb 10s, no esa needs. Get OP records.  MBD ckd - Ca in range, add on phos. Cont renvela as binder, get OP records.     Rob Sherrelle Prochazka  MD 03/31/2022, 1:09 PM Recent Labs  Lab 03/30/22 1438 03/31/22 0000  HGB 10.6* 9.3*  ALBUMIN 3.8 3.3*  CALCIUM 9.5 9.0  CREATININE 8.66* 8.97*  K 5.1 5.3*   Inpatient medications:  aspirin EC  81 mg Oral Daily   atorvastatin  80 mg Oral Daily   Chlorhexidine Gluconate Cloth  6 each Topical Q0600   clopidogrel  75 mg Oral Daily   fentaNYL (SUBLIMAZE) injection  50 mcg Intravenous Once   gabapentin  300 mg Oral BID   insulin aspart  0-6 Units Subcutaneous Q4H   midodrine  5-10 mg Oral See admin instructions   pantoprazole  40 mg Oral Daily   sevelamer carbonate  1,600 mg Oral TID WC   sodium chloride flush  3 mL Intravenous Q12H    sodium chloride     sodium chloride 10 mL/hr at 03/31/22 0511   nitroGLYCERIN     sodium chloride, acetaminophen **OR** acetaminophen, albuterol, HYDROcodone-acetaminophen, metoCLOPramide (REGLAN) injection, nitroGLYCERIN, sevelamer carbonate, sodium chloride flush

## 2022-03-31 NOTE — ED Notes (Signed)
Dr Roel Cluck reports understanding of the patient's report of sudden onset chest pressure, left arm pain, nausea, and dizziness. Provider reports to page cardiology. Kennyth Lose NT asked to page them at this time with response that she will do so.

## 2022-03-31 NOTE — ED Notes (Signed)
Provider Dr Roel Cluck at bedside. patient placed on 2L of 02  via nasal cannula. Patient has call bell in reach and provided with warm blankets

## 2022-03-31 NOTE — ED Notes (Signed)
Patient reports nausea and dizziness at this time. Provider Dr Roel Cluck  notified.

## 2022-03-31 NOTE — Progress Notes (Signed)
Pt receives out-pt HD at [facility] on MWF. Pt arrives at 11:00 for 11:20 chair time. Will assist as needed.   Jb Dulworth Renal Navigator [phone] 

## 2022-03-31 NOTE — Assessment & Plan Note (Signed)
Glucose normal - Continue SS corrections 

## 2022-03-31 NOTE — Assessment & Plan Note (Signed)
Continue midodrine  

## 2022-03-31 NOTE — Interval H&P Note (Signed)
Cath Lab Visit (complete for each Cath Lab visit)  Clinical Evaluation Leading to the Procedure:   ACS: Yes.    Non-ACS:    Anginal Classification: CCS IV  Anti-ischemic medical therapy: Minimal Therapy (1 class of medications)  Non-Invasive Test Results: No non-invasive testing performed  Prior CABG: No previous CABG      History and Physical Interval Note:  03/31/2022 5:03 PM  Rodney Farley  has presented today for surgery, with the diagnosis of unstable angina.  The various methods of treatment have been discussed with the patient and family. After consideration of risks, benefits and other options for treatment, the patient has consented to  Procedure(s): LEFT HEART CATH AND CORONARY ANGIOGRAPHY (N/A) as a surgical intervention.  [name] history has been reviewed, patient examined, no change in status, stable for surgery.  I have reviewed the patient's chart and labs.  Questions were answered to the patient's satisfaction.     Larae Grooms

## 2022-03-31 NOTE — Assessment & Plan Note (Signed)
Neurology consulted for possible seizure.  Spot EEG yesterday normal.  Monitored with LTM EEG from 3am to 9am, and all normal. - Consult Neurology, appreciate cares

## 2022-03-31 NOTE — Assessment & Plan Note (Signed)
-   Consult Nephrology for routine HD 

## 2022-03-31 NOTE — Assessment & Plan Note (Signed)
Hgb stable 

## 2022-03-31 NOTE — Assessment & Plan Note (Signed)
Continue atorvastatin

## 2022-03-31 NOTE — ED Notes (Signed)
Obtained consent for hemodialysis

## 2022-03-31 NOTE — ED Notes (Signed)
Nitro drip not started at this time due to patient's BP being in the 90s. Receiving RN notified of same. Will page provider and make him aware of same.

## 2022-03-31 NOTE — Progress Notes (Signed)
This chaplain responded to unit page for Pt. spiritual care and prayer before the Pt. procedure.  The chaplain listened reflectively to the Pt. faith journey through his adult life. The Pt. Identifies himself with the [employer].  The Pt. emphasizes the importance of prayer to reduce the Pt. anxiety as he waits for his procedure. The chaplain understands the Pt. is also contacting community clergy.  Prayer and permission to ask the medical team questions as he waits was shared with the Pt.  Chaplain Sallyanne Kuster [phone]

## 2022-03-31 NOTE — ED Notes (Signed)
Obtained consent for cardiac cath today. Pt is concerned about when he will have dialysis. Last dialysis was Friday. Will notify MD on rounds today.

## 2022-03-31 NOTE — H&P (View-Only) (Signed)
Progress Note  Patient Name: Rodney Farley Date of Encounter: 03/31/2022  Primary Cardiologist: Dr Zealand [name]  Subjective   Patient states that he has experienced some recurrent chest pain since early this morning and took a total of 3 sublingual nitroglycerin pills taken 1 at a time due to his recurrent discomfort.  Currently he is pain-free.  Anticipating repeat cardiac catheterization later today.  Inpatient Medications    Scheduled Meds:  aspirin EC  81 mg Oral Daily   atorvastatin  80 mg Oral Daily   Chlorhexidine Gluconate Cloth  6 each Topical Q0600   clopidogrel  75 mg Oral Daily   fentaNYL (SUBLIMAZE) injection  50 mcg Intravenous Once   gabapentin  300 mg Oral BID   insulin aspart  0-6 Units Subcutaneous Q4H   midodrine  5-10 mg Oral See admin instructions   pantoprazole  40 mg Oral Daily   sevelamer carbonate  1,600 mg Oral TID WC   sodium chloride flush  3 mL Intravenous Q12H   Continuous Infusions:  sodium chloride     sodium chloride 10 mL/hr at 03/31/22 0511   PRN Meds: sodium chloride, acetaminophen **OR** acetaminophen, albuterol, HYDROcodone-acetaminophen, metoCLOPramide (REGLAN) injection, nitroGLYCERIN, sevelamer carbonate, sodium chloride flush   Vital Signs    Vitals:   03/31/22 1115 03/31/22 1130 03/31/22 1200 03/31/22 1205  BP: (!) 148/82 134/73 (!) 143/70   Pulse: 79 85 79 86  Resp: 18 20 (!) 31 18  Temp:      TempSrc:      SpO2: 99% 96% 95% 93%  Weight:      Height:       No intake or output data in the 24 hours ending 03/31/22 1208  I/O since admission:   Filed Weights   03/30/22 1412  Weight: 98.4 kg    Telemetry    A-V  paced rhythm at 80 bpm personally Reviewed  ECG    ECG (independently read by me): AV paced rhythm at 81 bpm   Physical Exam    BP (!) 143/70   Pulse 86   Temp 97.9 F (36.6 C) (Oral)   Resp 18   Ht 5\' 7"  (1.702 m)   Wt 98.4 kg   SpO2 93%   BMI 33.99 kg/m  General: Alert,  oriented, no distress.  Skin: normal turgor, no rashes, warm and dry HEENT: Normocephalic, atraumatic. Pupils equal round and reactive to light; sclera anicteric; extraocular muscles intact;  Nose without nasal septal hypertrophy Mouth/Parynx benign; Mallinpatti scale Neck: Thick neck; no JVD, no carotid bruits; normal carotid upstroke Lungs: clear to ausculatation and percussion; no wheezing or rales Chest wall: without tenderness to palpitation Heart: PMI not displaced, RRR, s1 s2 normal, 3-1/5 systolic murmur, no diastolic murmur, no rubs, gallops, thrills, or heaves Abdomen: soft, nontender; no hepatosplenomehaly, BS+; abdominal aorta nontender and not dilated by palpation. Back: no CVA tenderness Pulses 2+ Musculoskeletal: full range of motion, normal strength, no joint deformities Extremities: no clubbing cyanosis or edema, Homan's sign negative  Neurologic: grossly nonfocal; Cranial nerves grossly wnl Psychologic: Normal mood and affect     Labs    Chemistry Recent Labs  Lab 03/30/22 1438 03/31/22 0000  NA 140 140  K 5.1 5.3*  CL 97* 100  CO2 29 28  GLUCOSE 152* 151*  BUN 34* 37*  CREATININE 8.66* 8.97*  CALCIUM 9.5 9.0  PROT 6.9 5.9*  ALBUMIN 3.8 3.3*  AST 23 18  ALT 25 22  ALKPHOS  77 67  BILITOT 0.7 0.8  GFRNONAA 5* 5*  ANIONGAP 14 12     Hematology Recent Labs  Lab 03/30/22 1438 03/30/22 2108 03/31/22 0000  WBC 7.9  --  5.8  RBC 3.05* 2.78* 2.64*  HGB 10.6*  --  9.3*  HCT 32.1*  --  28.8*  MCV 105.2*  --  109.1*  MCH 34.8*  --  35.2*  MCHC 33.0  --  32.3  RDW 14.9  --  14.9  PLT 126*  --  117*   HS Troponin: 54 > 57 >   59  BNPNo results for input(s): "BNP", "PROBNP" in the last 168 hours.   DDimer No results for input(s): "DDIMER" in the last 168 hours.   Lipid Panel     Component Value Date/Time   CHOL 153 01/06/2022 1228   TRIG 197 (H) 01/06/2022 1228   HDL 41 01/06/2022 1228   CHOLHDL 3.7 01/06/2022 1228   CHOLHDL 4.2 11/12/2021  0554   VLDL 47 (H) 11/12/2021 0554   LDLCALC 79 01/06/2022 1228   LDLDIRECT 60.0 04/13/2018 0926     Radiology    Overnight EEG with video  Result Date: 03/31/2022 Lora Havens, MD     03/31/2022  9:37 AM Patient Name: Clell Trahan MRN: [id number] Epilepsy Attending: Lora Havens Referring Physician/Provider: Amie Portland, MD Duration: 03/31/2022 0325 to 0930  Patient history: 88yo M with seizure like activity. EEG to evaluate for seizure  Level of alertness: Awake, asleep  AEDs during EEG study: None  Technical aspects: This EEG study was done with scalp electrodes positioned according to the 10-20 International system of electrode placement. Electrical activity was reviewed with band pass filter of 1-70Hz , sensitivity of 7 uV/mm, display speed of 29mm/sec with a 60Hz  notched filter applied as appropriate. EEG data were recorded continuously and digitally stored.  Video monitoring was available and reviewed as appropriate.  Description: The posterior dominant rhythm consists of 9 Hz activity of moderate voltage (25-35 uV) seen predominantly in posterior head regions, symmetric and reactive to eye opening and eye closing. Sleep was characterized by vertex waves, sleep spindles (12-14hz ), maximal fronto-central region. Hyperventilation and photic stimulation were not performed.    IMPRESSION: This study is within normal limits. No seizures or epileptiform discharges were seen throughout the recording.  Lora Havens    EEG adult  Result Date: 03/30/2022 Lora Havens, MD     03/30/2022  6:58 PM Patient Name: Kiowa Hollar MRN: [id number] Epilepsy Attending: Lora Havens Referring Physician/Provider: Kerney Elbe, MD Date: 03/30/2022 Duration: 23.33 mins Patient history: 88yo M with seizure like activity. EEG to evaluate for seizure Level of alertness: Awake AEDs during EEG study: None Technical aspects: This EEG study was done with scalp electrodes positioned according  to the 10-20 International system of electrode placement. Electrical activity was reviewed with band pass filter of 1-70Hz , sensitivity of 7 uV/mm, display speed of 20mm/sec with a 60Hz  notched filter applied as appropriate. EEG data were recorded continuously and digitally stored.  Video monitoring was available and reviewed as appropriate. Description: The posterior dominant rhythm consists of 9 Hz activity of moderate voltage (25-35 uV) seen predominantly in posterior head regions, symmetric and reactive to eye opening and eye closing. Patient was noted to have episodes of bilateral lower extremity twitching. Concomitant EEG before, during and after the event showed normal posterior dominant rhythm.  Hyperventilation and photic stimulation were not performed.   IMPRESSION: This study is within  normal limits. No seizures or epileptiform discharges were seen throughout the recording. Patient was noted to have episodes of bilateral lower extremity twitching without concomitant EEG change. These episodes were most likely NOT epileptic Lora Havens   DG Chest Port 1 View  Result Date: 03/30/2022 CLINICAL DATA:  Chest pain during hemodialysis. EXAM: PORTABLE CHEST 1 VIEW COMPARISON:  Radiographs 02/08/2022 and 01/21/2022.  CT 02/03/2022. FINDINGS: 1624 hours. Multiple left subclavian AICD leads are grossly stable. The heart is enlarged. There is aortic atherosclerosis and diffuse pulmonary interstitial prominence, similar to the most recent study. No confluent airspace opacity, pneumothorax or significant pleural effusion. The bones appear unremarkable. Several telemetry leads overlie the chest. IMPRESSION: Stable cardiomegaly and diffuse interstitial pulmonary prominence which may reflect mild edema. Electronically Signed   By: Richardean Sale M.D.   On: 03/30/2022 16:43   CT Head Wo Contrast  Result Date: 03/30/2022 CLINICAL DATA:  New onset seizure, altered level of consciousness EXAM: CT HEAD WITHOUT  CONTRAST TECHNIQUE: Contiguous axial images were obtained from the base of the skull through the vertex without intravenous contrast. RADIATION DOSE REDUCTION: This exam was performed according to the departmental dose-optimization program which includes automated exposure control, adjustment of the mA and/or kV according to patient size and/or use of iterative reconstruction technique. COMPARISON:  04/01/2019 FINDINGS: Brain: Stable hypodensities throughout the periventricular white matter and basal ganglia, consistent with chronic small vessel ischemic change. No signs of acute infarct or hemorrhage. The lateral ventricles and remaining midline structures are unremarkable. No acute extra-axial fluid collections. No mass effect. Vascular: Stable atherosclerosis.  No hyperdense vessel. Skull: Normal. Negative for fracture or focal lesion. Sinuses/Orbits: Stable postsurgical changes bilateral globes. Otherwise the orbits and paranasal sinuses are unremarkable. Other: None. IMPRESSION: 1. Stable head CT, no acute intracranial process. Electronically Signed   By: Randa Ngo M.D.   On: 03/30/2022 16:14    Cardiac Studies   L and R HC on 02/09/22:   Conclusions: Significant single-vessel coronary artery disease with 90% in-stent restenosis (minimal luminal area 2.9 mm) at site of stent placement in 11/2021 and repeat angioplasty in 12/2021. Moderate LCx disease that appears stable from prior catheterizations. Stable 70% proximal stenosis of nondominant RCA Patent D2 stent with 30% stenosis at the ostium of D2 before the stent.  Reported OM stent is not clearly visualized. Moderately to severely elevated left and right heart filling pressures (PCWP 33 mmHg, mean RA 14 mmHg). Normal to supranormal cardiac output, likely accentuated by dialysis fistula. Successful PTCA with intracoronary lithotripsy of the ostial/proximal LAD in-stent restenosis with reduction in stenosis from 90% to 20% and improvement in  flow from TIMI-2 to TIMI-3.  Additional stent placement at the ostium of the LAD was not pursued given inability to completely expand the vessel despite lithotripsy and high-pressure angioplasty.   Recommendations: Remove right femoral artery and venous sheaths after ACT has fallen below 175 seconds with manual compression. Continue indefinite dual antiplatelet therapy with aspirin and clopidogrel. Aggressive secondary prevention of coronary artery disease. Proceed with hemodialysis later today, if possible, given elevated filling pressures. Limited echo to reassess LVEF and aortic stenosis.     RA (mean): 14 mmHg RV (S/EDP): 57/15 mmHg PA (S/D, mean): 57/30 (39) mmHg PCWP (mean): 33 mmHg  Ao sat: 92% PA sat: 72%  Fick CO: 10.6 L/min Fick CI: 5.0 L/min/m^2  Thermodilution CO: 6.5 L/min Thermodilution CI: 3.1 L/min/m^2      Intervention      Echo from 11/11/21:  1. Left ventricular ejection fraction, by estimation, is 45 to 50%. The  left ventricle has mildly decreased function. The left ventricle  demonstrates regional wall motion abnormalities (see scoring  diagram/findings for description). The left ventricular   internal cavity size was mildly dilated. There is mild left ventricular  hypertrophy. Left ventricular diastolic parameters are consistent with  Grade I diastolic dysfunction (impaired relaxation).   2. Right ventricular systolic function is normal. The right ventricular  size is normal. Tricuspid regurgitation signal is inadequate for assessing  PA pressure.   3. Left atrial size was moderately dilated.   4. The mitral valve is degenerative. Trivial mitral valve regurgitation.  No evidence of mitral stenosis.   5. The aortic valve was not well visualized. Aortic valve regurgitation  is not visualized. Moderate aortic valve stenosis. Vmax 2.9 m/s, MG  77mmHg, AVA 1.2 cm^2, DI 0.38     Patient Profile     88 y.o. male who has a PMH of CAD with multiple  stents and intervention, ischemic cardiomyopathy, chronic systolic heart failure with recovering EF, moderate aortic stenosis,  complete heart block s/p ICD-CRT, atrial fibrillation, HLD, hypotension, type 2 DM, ESRD on HD, OSA, COPD, TIA, BPH, who was seen 03/30/2022 for the evaluation of chest pain at the request of Harris, Abigail PA-C.     Assessment & Plan    CAD with history of multiple stents and intervention.  Patient's most recent cardiac catheterization on February 09, 2022 demonstrated ostial proximal LAD in-stent restenosis which was treated with successful PCI with shockwave lithotripsy.  The patient initially had done well but over the past 3 to 4 weeks has developed recurrent anginal symptomatology.  Troponins are minimally increased not consistent with ACS.  ECG is not diagnostic secondary to paced rhythm.  With his recurrent symptomatology, he is scheduled to undergo repeat cardiac catheterization later today.  This morning he experienced 3 short-lived episodes of chest pain and took 3 sublingual nitroglycerin in total.  Will initiate low-dose IV nitroglycerin presently in anticipation of his catheterization later today. Ischemic cardiomyopathy.  Echo Doppler study in May 2023 showed EF 45 to 50% with wall motion abnormality involving the apical septum apical inferior and apex with akinesis.  Previously had been on carvedilol but this was stopped secondary to low blood pressure. End-stage kidney disease, on dialysis for the past 3 years.  Yesterday he was noted to have hand shaking and tremoring during dialysis leading to his evaluation and ending dialysis prior to its completion. History of complete heart block, status post ICD-CRT with recent generator change out in March 2023 followed by Dr. Caryl Comes. Aortic Valve Stenosis: moderate stenosis noted on May 2023 cath with AVA 1.2 cm. Question seizure-like activity: EEG performed, the study is within normal limits with no seizures or epileptiform  discharges throughout the recording   Patient is aware of the risk benefits of the catheterization procedure.  Plan cath later today.  Signed, Troy Sine, MD, [facility] 03/31/2022, 12:08 PM

## 2022-03-31 NOTE — Procedures (Addendum)
Patient Name: Rodney Farley  MRN: [id number]  Epilepsy Attending: Lora Havens  Referring Physician/Provider: Amie Portland, MD  Duration: 03/31/2022 0325 to 04/01/2022 0325   Patient history: 88yo M with seizure like activity. EEG to evaluate for seizure   Level of alertness: Awake, asleep   AEDs during EEG study: None   Technical aspects: This EEG study was done with scalp electrodes positioned according to the 10-20 International system of electrode placement. Electrical activity was reviewed with band pass filter of 1-70Hz , sensitivity of 7 uV/mm, display speed of 72mm/sec with a 60Hz  notched filter applied as appropriate. EEG data were recorded continuously and digitally stored.  Video monitoring was available and reviewed as appropriate.   Description: The posterior dominant rhythm consists of 9 Hz activity of moderate voltage (25-35 uV) seen predominantly in posterior head regions, symmetric and reactive to eye opening and eye closing. Sleep was characterized by vertex waves, sleep spindles (12-14hz ), maximal fronto-central region. Hyperventilation and photic stimulation were not performed.      IMPRESSION: This study is within normal limits. No seizures or epileptiform discharges were seen throughout the recording.   Amdrew Oboyle Barbra Sarks

## 2022-03-31 NOTE — Progress Notes (Signed)
LTM EEG hooked up and running - no initial skin breakdown - push button tested - neuro notified. No [facility] monitoring. Pt in ED.  

## 2022-03-31 NOTE — Hospital Course (Signed)
Rodney Farley is an 88 y.o. M with hx ESRD on HD MWF, COPD, sCHF, hx CHB s/p PPM, DM, HTN, OSA and obesity who presented with progressive chest pain and shortness of breath. In addition, he has had new episodes of "whole body shaking".  In the ER, Cardiology and Neurology were consulted.

## 2022-03-31 NOTE — ED Notes (Signed)
This RN spoke with Shelva Majestic, provider who ordered nitro drip and notified him of patient's BP trending in the 45T systolic. Provider ok to hold nitro drip at this time. Receiving RN on 3E notified of same.

## 2022-03-31 NOTE — Progress Notes (Signed)
Site area: rt groin Site Prior to Removal:  Level 0 Pressure Applied For: 20 minutes Manual:   yes Patient Status During Pull:  stable Post Pull Site:  Level 0 Post Pull Instructions Given:  yes Post Pull Pulses Present: rt dp dopplered Dressing Applied:  gauze and tegaderm Bedrest begins @ 2020 Comments:

## 2022-03-31 NOTE — Progress Notes (Signed)
  Progress Note   Patient: Rodney Farley TGY:[id number] DOB: 1932/01/31 DOA: 03/30/2022     0 DOS: the patient was seen and examined on 03/31/2022 at 9:54AM      Brief hospital course: Rodney Farley is an 88 y.o. M with hx ESRD on HD MWF, COPD, sCHF, hx CHB s/p PPM, DM, HTN, OSA and obesity who presented with progressive chest pain and shortness of breath. In addition, he has had new episodes of "whole body shaking".  In the ER, Cardiology and Neurology were consulted.       Assessment and Plan: * Unstable angina (Rodney Farley) - Defer heparin to Cardiology - Continue aspirin, atorvastatin, Plavix - Consult Cardiology, plan to go to Rodney Farley today     ESRD on hemodialysis [facility]) - COnsult Nephrology for routine HD after cath  Hypotension - Continue midodrine  Hyperkalemia, diminished renal excretion - Consult Nephrology for routine HD   Hyperlipidemia - Continue atorvastatin  Type II diabetes mellitus (HCC) Glucose normal - Continue SS corrections  Obesity (BMI 30-39.9) BMI 34  Tremor Neurology consulted for possible seizure.  Spot EEG yesterday normal.  Monitored with LTM EEG from 3am to 9am, and all normal. - Consult Neurology, appreciate cares  Acute on chronic systolic CHF (congestive heart failure) (HCC) - Volume control with dialysis - Consult Cardiology, appreciate cares  Atrial fibrillation (Rodney Farley) - SCAF Not on AC.  Rate controlled.  Iron deficiency anemia, unspecified Hgb stable  Atrioventricular block, complete (HCC)            Subjective: Patient had some brief chest pain today.  No seizure-like activity.  No loss of consciousness.  No confusion.  No dyspnea, no swelling.     Physical Exam: BP 130/61 (BP Location: Left Arm)   Pulse 80   Temp 98.9 F (37.2 C) (Oral)   Resp 18   Ht 5\' 7"  (1.702 m)   Wt 99.5 kg   SpO2 100%   BMI 34.36 kg/m   Adult male, lying in bed, appears weak and tired RRR, no murmurs, no peripheral  edema Respiratory rate normal, lungs clear without rales or wheezes, overall diminished Abdomen soft no tenderness palpation or guarding, no ascites or distention Attention normal, affect blunted, judgment and insight appear normal for age, face symmetric, speech fluent    Data Reviewed: Labs notable for creatinine 8.97, potassium 5.3 Troponin 50s Chest x-ray shows mild edema Ferritin greater than thousand, B12 normal Hemoglobin 9, no change      Disposition: Status is: Observation Patient was admitted with chest pain, will undergo coronary cath today, dialysis afterwards Neurology was also consulted for possible seizure-like activity, although this seems clinically insignificant        Author: Edwin Dada, MD 03/31/2022 4:54 PM  For on call review [email or web address].

## 2022-03-31 NOTE — Progress Notes (Signed)
Patient returned from Cath Lab. Tech went to reconnect pt study. Transport was taking pt to dialysis. Told RN to call Tech when patient returns.

## 2022-03-31 NOTE — TOC Initial Note (Signed)
Transition of Care [facility]) - Initial/Assessment Note    Patient Details  Name: Rodney Farley MRN: [id number] Date of Birth: [birth year]  [facility]) CM/SW Contact:    Bethann Berkshire, [place] Phone Number: 03/31/2022, 4:30 PM  Clinical Narrative:                  CSW met with pt with pt bedside and discussed SNF recommendation. Pt lives at home alone though son and daughter in law live a few minutes away and are able to assist pt some at home. Pt receives OPHD at [facility] MWF chair time at 1120am. Pt reports he has been to [facility] in the past about 5 years ago so is somewhat familiar with SNF process. He is agreeable to SNF workup though requests CSW speaks with his son. Pt calls son and gives [facility] phone.   CSW provided update to son on PT recommendation for rehab at [facility]. Son explains he is hesitant about pt going to SNF and that family would need to discuss further regarding decision between SNF and pt returning home with [facility]. Family along with friends/church members may be able to support pt at home. Son is agreeable to initially SNF workup. TOC will need to follow up regarding final decision between SNF and HH. Will complete fl2 and fax bed requests in hub.   Expected Discharge Plan: Skilled Nursing Facility Barriers to Discharge: Continued Medical Work up, SNF Pending bed offer   Patient Goals and CMS Choice        Expected Discharge Plan and Services Expected Discharge Plan: [place] arrangements for the past 2 months: Single Family Home                                      Prior Living Arrangements/Services Living arrangements for the past 2 months: Single Family Home Lives with:: Self Patient language and need for interpreter reviewed:: Yes        Need for Family Participation in Patient Care: Yes (Comment) Care giver support system in place?: Yes (comment)   Criminal Activity/Legal Involvement Pertinent to Current  Situation/Hospitalization: No - Comment as needed  Activities of Daily Living      Permission Sought/Granted   Permission granted to share information with : Yes, Verbal Permission Granted  Share Information with NAME: Arhan, Mcmanamon (Son)   [phone] [facility])           Emotional Assessment Appearance:: Appears stated age Attitude/Demeanor/Rapport: Engaged Affect (typically observed): Accepting Orientation: : Oriented to Self, Oriented to Place, Oriented to  Time, Oriented to Situation Alcohol / Substance Use: Not Applicable Psych Involvement: No (comment)  Admission diagnosis:  Unstable angina (HCC) [I20.0] Tonic clonic convulsion (HCC) [G40.409] Chest pain with high risk for cardiac etiology [R07.9] Patient Active Problem List   Diagnosis Date Noted   Unstable angina ([place]) 03/30/2022   Tremor 03/30/2022   Acute on chronic systolic CHF (congestive heart failure) ([place]) 01/12/2022   Aortic stenosis 12/24/2021   Hypotension 12/24/2021   Secondary hypoparathyroidism ([place]) 12/24/2021   NSTEMI (non-ST elevated myocardial infarction) ([place]) 12/23/2021   History of TIA (transient ischemic attack)    History of non-ST elevation myocardial infarction (NSTEMI) 11/11/2021   Atrial fibrillation ([place]) - SCAF 08/26/2021   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 07/29/2020   Dependence on renal dialysis ([place]) 11/13/2019  Coagulation defect, unspecified ([place]) 05/11/2019   Encounter for immunization 04/25/2019   Thrombocytopenia ([place]) 04/13/2019   Hypokalemia 04/12/2019   Anaphylactic shock, unspecified, initial encounter 04/10/2019   Anemia in chronic kidney disease 49/67/5916   Complication of vascular dialysis catheter 04/10/2019   Iron deficiency anemia, unspecified 04/10/2019   Secondary hyperparathyroidism of renal origin ([place]) 04/10/2019   Hyperkalemia, diminished renal excretion 04/01/2019   Hyperlipidemia 04/13/2018   Non-ST elevation (NSTEMI)  myocardial infarction ([place]) 10/15/2017   S/P cholecystectomy 10/13/2017   Diarrhea 05/11/2017   Marital stress 02/18/2017   BPH associated with nocturia 04/27/2016   Diabetic peripheral neuropathy (HCC)    COPD (chronic obstructive pulmonary disease) ([place])    Bell's palsy    Gout 06/26/2015   Diverticulitis 04/08/2015   DOE (dyspnea on exertion), due to significant CAD 03/28/2015   ESRD on hemodialysis ([place]) 03/04/2015   ESRD on dialysis ([place]) 03/04/2015   Coronary artery disease 09/13/2014   GERD (gastroesophageal reflux disease) 08/07/2012   Major depression, recurrent, full remission ([place]) 08/05/2012   Sleep apnea 08/05/2012   Type II diabetes mellitus ([place]) 08/05/2012   Hypertension 08/05/2012   Depression 08/05/2012   Diabetes type 2, uncontrolled 08/05/2012   Ischemic cardiomyopathy 11/03/2011   Atrioventricular block, complete (HCC)    Obesity (BMI 30-39.9)    Pacemaker    PCP:  [name], MD Pharmacy:   [place] [place] [address] Phone: [phone] Fax: [place] [address] [place] [address] Phone: [phone] Fax: [phone]     Social Determinants of Health (SDOH) Interventions    Readmission Risk Interventions     No data to display

## 2022-03-31 NOTE — Evaluation (Signed)
Physical Therapy Evaluation Patient Details Name: Rodney Farley MRN: [id number] DOB: 1932-06-24 Today's Date: 03/31/2022  History of Present Illness  Pt is an 88 y/o male presenting with chest pain, SOB and tremors. Initial EEG negative with further overnight EEG indicated. Admitted for further work up and possible heart cath. PMH: ESRD on HD MWF, AICD, Bell's Palsy, BPH, COPD, CAD s/p MI, GERD, DM, neuropathy, gout, HTN, shingles, pancreatitis, TIA  Clinical Impression  Pt admitted secondary to problem above with deficits below. Pt requiring min A to roll from side to side for linen changed. Noted full body shaking when turning to the R, but pt conversational throughout. After linen change, pt reporting increase in tightness so further mobility deferred. RN notified. VSS throughout. Pt currently lives alone and feel he will have increased difficulty caring for himself. Recommending SNF level therapies at d/c. Will continue to follow acutely.        Recommendations for follow up therapy are one component of a multi-disciplinary discharge planning process, led by the attending physician.  Recommendations may be updated based on patient status, additional functional criteria and insurance authorization.  Follow Up Recommendations Skilled nursing-short term rehab (<3 hours/day) Can patient physically be transported by private vehicle: No    Assistance Recommended at Discharge Frequent or constant Supervision/Assistance (initially)  Patient can return home with the following  A little help with bathing/dressing/bathroom;A little help with walking and/or transfers;Assistance with cooking/housework;Help with stairs or ramp for entrance;Assist for transportation    Equipment Recommendations Other (comment) (TBD pending progression)  Recommendations for Other Services       Functional Status Assessment Patient has had a recent decline in their functional status and demonstrates the ability  to make significant improvements in function in a reasonable and predictable amount of time.     Precautions / Restrictions Precautions Precautions: Fall Precaution Comments: monitor RR/SOB Restrictions Weight Bearing Restrictions: No      Mobility  Bed Mobility Overal bed mobility: Needs Assistance Bed Mobility: Rolling Rolling: Min assist         General bed mobility comments: Min A to roll side to side on stretcher with linen change. with initial roll to R side, pt with involuntary body shaking. Due to SOB and chest tightness, did not attempt EOB. RN notified. VSS    Transfers                   General transfer comment: deferred at this time    Ambulation/Gait                  Stairs            Wheelchair Mobility    Modified Rankin (Stroke Patients Only)       Balance Overall balance assessment:  (to be further assessed)                                           Pertinent Vitals/Pain Pain Assessment Pain Assessment: Faces Faces Pain Scale: Hurts little more Pain Location: chest Pain Descriptors / Indicators: Tightness, Pressure Pain Intervention(s): Limited activity within patient's tolerance, Monitored during session, Repositioned    Home Living Family/patient expects to be discharged to:: Private residence Living Arrangements: Alone Available Help at Discharge: Family;Available PRN/intermittently Type of Home: Apartment Home Access: Level entry       Home Layout: One level Home Equipment: Rolling  Walker (2 wheels);Rollator (4 wheels);Shower seat;Grab bars - tub/shower      Prior Function Prior Level of Function : Independent/Modified Independent;History of Falls (last six months)             Mobility Comments: Uses rolling walker or rollator when out. Mostly furniture walker inside ADLs Comments: Reports Mod I for ADLs, basic IADLs. sponge bathes on bad days but typically able to get in shower and  use shower chair     Hand Dominance   Dominant Hand: Right    Extremity/Trunk Assessment   Upper Extremity Assessment Upper Extremity Assessment: Defer to OT evaluation    Lower Extremity Assessment Lower Extremity Assessment: Generalized weakness    Cervical / Trunk Assessment Cervical / Trunk Assessment: Normal  Communication   Communication: HOH  Cognition Arousal/Alertness: Awake/alert Behavior During Therapy: WFL for tasks assessed/performed Overall Cognitive Status: Within Functional Limits for tasks assessed                                          General Comments General comments (skin integrity, edema, etc.): HR, BP and SpO2 on 2 L O2 WFL. RR up to 41 with SOB/activity but normalized down to 20s with breathing cues and rest break    Exercises     Assessment/Plan    PT Assessment Patient needs continued PT services  PT Problem List Decreased strength;Decreased activity tolerance;Decreased balance;Decreased mobility;Decreased knowledge of use of DME;Decreased knowledge of precautions       PT Treatment Interventions DME instruction;Gait training;Therapeutic activities;Functional mobility training;Therapeutic exercise;Balance training;Patient/family education    PT Goals (Current goals can be found in the Care Plan section)  Acute Rehab PT Goals Patient Stated Goal: to feel better PT Goal Formulation: With patient Time For Goal Achievement: 04/14/22 Potential to Achieve Goals: Good    Frequency Min 2X/week     Co-evaluation PT/OT/SLP Co-Evaluation/Treatment: Yes Reason for Co-Treatment: Complexity of the patient's impairments (multi-system involvement);For patient/therapist safety PT goals addressed during session: Mobility/safety with mobility;Balance OT goals addressed during session: ADL's and self-care;Strengthening/ROM       AM-PAC PT "6 Clicks" Mobility  Outcome Measure Help needed turning from your back to your side while  in a flat bed without using bedrails?: A Little Help needed moving from lying on your back to sitting on the side of a flat bed without using bedrails?: A Little Help needed moving to and from a bed to a chair (including a wheelchair)?: A Little Help needed standing up from a chair using your arms (e.g., wheelchair or bedside chair)?: A Lot Help needed to walk in hospital room?: A Lot Help needed climbing 3-5 steps with a railing? : Total 6 Click Score: 14    End of Session   Activity Tolerance: Treatment limited secondary to medical complications (Comment) (chest pain) Patient left: in bed;with call bell/phone within reach (on stretcher in ED) Nurse Communication: Mobility status PT Visit Diagnosis: Unsteadiness on feet (R26.81);Muscle weakness (generalized) (M62.81);Difficulty in walking, not elsewhere classified (R26.2)    Time: 1007-1020 PT Time Calculation (min) (ACUTE ONLY): 13 min   Charges:   PT Evaluation $PT Eval Moderate Complexity: 1 Mod          Reuel Derby, PT, DPT  Acute Rehabilitation Services  Office: [phone]   Rudean Hitt 03/31/2022, 2:49 PM

## 2022-03-31 NOTE — ED Notes (Signed)
This RN spoke with Fransico Him from cardiology at this time who reports to page her back if the patient has increasing cardiac symptoms or requires more than 2 nitro tables without pain relief. For now patient is reporting that his pain has improved .

## 2022-03-31 NOTE — Progress Notes (Signed)
No info on when he may or may not be going for cath. Will order LTM EEG for now.   -- Amie Portland, MD Neurologist [facility] Pager: [phone]

## 2022-03-31 NOTE — Consult Note (Signed)
Neurology Consultation  Reason for Consult: Concern for seizures Referring Physician: Quentin Mulling, PA-C  CC: Chest pain, frequent twitching and jerking of all extremities  History is obtained from: Patient, chart  HPI: Rodney Farley is a 88 y.o. male past medical history of anxiety, depression, left-sided Bell's palsy with mild residual left facial weakness, severe COPD, coronary artery disease, complete heart block status post PPM placement, diabetes, diabetic polyneuropathy, hypertension, hyperlipidemia, sleep apnea, ESRD on Monday Wednesday Friday dialysis presented to the emergency room for evaluation of chest pain that is associated with shortness of breath and worse on exertion-which the patient themselves cause as his angina that is worsening, which is being managed by medicine and cardiology, also reported since July 2023 of having episodes of arm and leg shaking which she also had today and dialysis.  Patient seems to recollect that ever since his hospitalization in July, he has been having these off-and-on episodes of whole body shaking, that can happen up to 6 times a day.  No provoking factors, no relieving factors.  No aura-like sensation preceding these.  The episodes lasts for about few minutes every time that they happen and stop on their own.  No relationship with dialysis days or nondialysis days and no relationship with any chest discomfort or feeling of anxiety.  He reports that during these episodes, he feels "distant to the people around".  When I asked him to describe this further, he reports that he is not unresponsive but he is unable to understand what the other people are saying although he can see their mouths move and cannot figure out that they are probably trying to talk to him-but he cannot understand them at all.  He has no history of strokes in the past that he can tell me although chart lists TIA on the problem list.  This case was discussed with the  on-call neurologist who recommended an EEG be obtained.  Routine EEG was done and bilateral lower extremity twitching/shaking episodes were captured without any EEG correlate.  There is no reported firing of the PPM.  He has been seen and evaluated by the cardiology team-episodes concerning for in-stent restenosis likely due to underexpanded LAD stent due to heavy calcified plaque but thankfully no evidence of myocardial necrosis from an enzyme standpoint-plan for coronary angiography in the morning.  ROS: Full ROS was performed and is negative except as noted in the HPI.   Past Medical History:  Diagnosis Date   AICD (automatic cardioverter/defibrillator) present    [employer] pacer   Anemia    Anxiety and depression    Arthritis    Bell's palsy    left side. after shingles episode   BPH associated with nocturia    Chronic systolic CHF (congestive heart failure) ([place])    EF normalized by Echo 2019   COPD (chronic obstructive pulmonary disease) ([place])    Severe   Coronary artery disease    a. s/p MI in 1994/1995 while in [place] s/p questionable PCI. 03/2015: progression of disease, for staged PCI.   Diabetic peripheral neuropathy (HCC)    GERD (gastroesophageal reflux disease)    Gout    Hard of hearing    B/L   History of chronic pancreatitis 07/23/2017   noted on CT abd/pelvis   History of shingles    Hypercholesterolemia    Hypertension    Obesity    Sleep apnea    "sleeps w/humidifyer when he panics and gets short of breath" (04/08/2015)  TIA (transient ischemic attack) X 3   Trigger middle finger of left hand    Type II diabetes mellitus ([place])    Wears glasses    Wears hearing aid      Family History  Problem Relation Age of Onset   Stroke Mother    Leukemia Father    Stroke Sister    Heart attack Brother      Social History:   reports that he quit smoking about 14 years ago. His smoking use included cigarettes. He has a 81.00 pack-year smoking history. He has  never been exposed to tobacco smoke. He has never used smokeless tobacco. He reports current alcohol use. He reports that he does not use drugs.  Medications  Current Facility-Administered Medications:    0.9 %  sodium chloride infusion, 250 mL, Intravenous, PRN, Doutova, Anastassia, MD   acetaminophen (TYLENOL) tablet 650 mg, 650 mg, Oral, Q6H PRN **OR** acetaminophen (TYLENOL) suppository 650 mg, 650 mg, Rectal, Q6H PRN, Doutova, Anastassia, MD   albuterol (PROVENTIL) (2.5 MG/3ML) 0.083% nebulizer solution 2.5 mg, 2.5 mg, Nebulization, Q2H PRN, Doutova, Anastassia, MD   aspirin EC tablet 81 mg, 81 mg, Oral, Daily, Doutova, Anastassia, MD   atorvastatin (LIPITOR) tablet 80 mg, 80 mg, Oral, Daily, Doutova, Anastassia, MD   clopidogrel (PLAVIX) tablet 75 mg, 75 mg, Oral, Daily, Doutova, Anastassia, MD   fentaNYL (SUBLIMAZE) injection 50 mcg, 50 mcg, Intravenous, Once, Doutova, Anastassia, MD   gabapentin (NEURONTIN) capsule 300 mg, 300 mg, Oral, BID, Doutova, Anastassia, MD, 300 mg at 03/30/22 2326   HYDROcodone-acetaminophen (NORCO/VICODIN) 5-325 MG per tablet 1-2 tablet, 1-2 tablet, Oral, Q4H PRN, Doutova, Anastassia, MD   insulin aspart (novoLOG) injection 0-6 Units, 0-6 Units, Subcutaneous, Q4H, Doutova, Anastassia, MD, 1 Units at 03/30/22 2335   metoCLOPramide (REGLAN) injection 5 mg, 5 mg, Intravenous, Q6H PRN, Doutova, Anastassia, MD, 5 mg at 03/31/22 0040   midodrine (PROAMATINE) tablet 5-10 mg, 5-10 mg, Oral, See admin instructions, Doutova, Anastassia, MD   nitroGLYCERIN (NITROSTAT) SL tablet 0.4 mg, 0.4 mg, Sublingual, Q5 min PRN, Doutova, Anastassia, MD, 0.4 mg at 03/31/22 0016   pantoprazole (PROTONIX) EC tablet 40 mg, 40 mg, Oral, Daily, Doutova, Anastassia, MD   sevelamer carbonate (RENVELA) tablet 1,600 mg, 1,600 mg, Oral, TID WC, Doutova, Anastassia, MD   sevelamer carbonate (RENVELA) tablet 800 mg, 800 mg, Oral, PRN, Doutova, Anastassia, MD   sodium chloride flush (NS) 0.9 %  injection 3 mL, 3 mL, Intravenous, Q12H, Doutova, Anastassia, MD, 3 mL at 03/30/22 2313   sodium chloride flush (NS) 0.9 % injection 3 mL, 3 mL, Intravenous, PRN, Doutova, Anastassia, MD  Current Outpatient Medications:    aspirin EC 81 MG tablet, Take 81 mg by mouth daily., Disp: , Rfl:    atorvastatin (LIPITOR) 80 MG tablet, Take 1 tablet by mouth daily., Disp: 90 tablet, Rfl: 3   calcitRIOL (ROCALTROL) 0.25 MCG capsule, Take 7 capsules (1.75 mcg total) by mouth every Monday, Wednesday, and Friday with hemodialysis., Disp: 100 capsule, Rfl: 3   carbamide peroxide (DEBROX) 6.5 % OTIC solution, Place 5 drops into both ears 2 (two) times daily as needed (earwax)., Disp: , Rfl:    Carboxymethylcellul-Glycerin (LUBRICATING EYE DROPS OP), Place 1 drop into both eyes 3 (three) times daily as needed (dry eyes)., Disp: , Rfl:    clopidogrel (PLAVIX) 75 MG tablet, Take 1 tablet (75 mg total) by mouth daily with breakfast., Disp: 90 tablet, Rfl: 3   gabapentin (NEURONTIN) 300 MG capsule, Take 1  capsule (300 mg total) by mouth 2 (two) times daily., Disp: 30 capsule, Rfl: 1   glucose blood (FREESTYLE TEST STRIPS) test strip, Use to check blood sugar daily, Disp: 100 each, Rfl: 3   glucose monitoring kit (FREESTYLE) monitoring kit, USE TO MONITOR BLOOD GLUCOSE AS DIRECTED, Disp: 1 each, Rfl: 1   Methoxy PEG-Epoetin Beta (MIRCERA IJ), as directed., Disp: , Rfl:    midodrine (PROAMATINE) 10 MG tablet, Take 1 tablet (10 mg) three times a week. Add 5 mg a night after dialysis as needed for low blood pressure. (Patient taking differently: Take 5-10 mg by mouth See admin instructions. Take 1 tablet (10 mg) three times a week. Add 5 mg a night after dialysis as needed for low blood pressure.), Disp: 24 tablet, Rfl: 3   multivitamin (RENA-VIT) TABS tablet, Take 1 tablet by mouth daily., Disp: 90 tablet, Rfl: 3   nitroGLYCERIN (NITROSTAT) 0.4 MG SL tablet, Place 1 tablet under the tongue every 5 minutes x 3 doses as  needed for chest pain., Disp: 25 tablet, Rfl: 12   oxymetazoline (AFRIN) 0.05 % nasal spray, Place 1 spray into both nostrils 2 (two) times daily as needed for congestion., Disp: 30 mL, Rfl: 0   pantoprazole (PROTONIX) 40 MG tablet, Take 1 tablet by mouth daily., Disp: 90 tablet, Rfl: 3   saccharomyces boulardii (FLORASTOR) 250 MG capsule, Take 1 capsule (250 mg total) by mouth 2 (two) times daily., Disp: 30 capsule, Rfl: 0   sevelamer carbonate (RENVELA) 800 MG tablet, Take 2 tablets (1,600 mg total) by mouth 3 (three) times daily with meals. (Patient taking differently: Take 1,600 mg by mouth See admin instructions. Take 2 tablets by mouth three times daily with meals and 1 tablet with snacks), Disp: , Rfl:    vitamin B-12 (CYANOCOBALAMIN) 1000 MCG tablet, Take 1,000 mcg by mouth daily., Disp: , Rfl:    Exam: Current vital signs: BP (!) 112/56   Pulse 61   Temp 98.4 F (36.9 C)   Resp (!) 21   Ht '5\' 7"'  (1.702 m)   Wt 98.4 kg   SpO2 99%   BMI 33.99 kg/m  Vital signs in last 24 hours: Temp:  [98 F (36.7 C)-98.6 F (37 C)] 98.4 F (36.9 C) (09/18 2241) Pulse Rate:  [61-81] 61 (09/19 0115) Resp:  [14-25] 21 (09/19 0115) BP: (107-140)/(47-90) 112/56 (09/19 0115) SpO2:  [89 %-99 %] 99 % (09/19 0115) Weight:  [98.4 kg] 98.4 kg (09/18 1412) General: Awake alert in no distress HEENT: Normocephalic atraumatic Lungs: Clear Cardiovascular: Regular rhythm Abdomen nondistended nontender Neurological exam Awake alert oriented x3 No dysarthria No aphasia Cranial nerves II to XII with the exception of mild left lower facial weakness, unremarkable. Motor examination with no drift in any of the 4 extremities Sensation intact to light touch Coordination with no dysmetria  NIH stroke scale-1 due to mild left facial weakness-likely baseline from Bell's palsy.  Labs I have reviewed labs in epic and the results pertinent to this consultation are:  CBC    Component Value Date/Time   WBC  5.8 03/31/2022 0000   RBC 2.64 (L) 03/31/2022 0000   RBC 2.78 (L) 03/30/2022 2108   HGB 9.3 (L) 03/31/2022 0000   HGB 11.5 (L) 10/02/2021 0908   HCT 28.8 (L) 03/31/2022 0000   HCT 34.4 (L) 10/02/2021 0908   PLT 117 (L) 03/31/2022 0000   PLT 154 10/02/2021 0908   MCV 109.1 (H) 03/31/2022 0000   MCV 100 (H)  10/02/2021 0908   MCH 35.2 (H) 03/31/2022 0000   MCHC 32.3 03/31/2022 0000   RDW 14.9 03/31/2022 0000   RDW 14.2 10/02/2021 0908   LYMPHSABS 2.2 03/30/2022 1438   LYMPHSABS 1.7 09/18/2015 1546   MONOABS 0.6 03/30/2022 1438   EOSABS 0.2 03/30/2022 1438   EOSABS 0.3 09/18/2015 1546   BASOSABS 0.0 03/30/2022 1438   BASOSABS 0.0 09/18/2015 1546    CMP     Component Value Date/Time   NA 140 03/31/2022 0000   NA 138 10/02/2021 0908   K 5.3 (H) 03/31/2022 0000   CL 100 03/31/2022 0000   CO2 28 03/31/2022 0000   GLUCOSE 151 (H) 03/31/2022 0000   BUN 37 (H) 03/31/2022 0000   BUN 30 (H) 10/02/2021 0908   CREATININE 8.97 (H) 03/31/2022 0000   CREATININE 2.45 (H) 06/18/2017 1555   CALCIUM 9.0 03/31/2022 0000   PROT 5.9 (L) 03/31/2022 0000   PROT 6.4 01/06/2022 1228   ALBUMIN 3.3 (L) 03/31/2022 0000   ALBUMIN 4.3 01/06/2022 1228   AST 18 03/31/2022 0000   ALT 22 03/31/2022 0000   ALKPHOS 67 03/31/2022 0000   BILITOT 0.8 03/31/2022 0000   BILITOT 0.4 01/06/2022 1228   GFRNONAA 5 (L) 03/31/2022 0000   GFRAA 11 (L) 04/06/2019 0500    Lipid Panel     Component Value Date/Time   CHOL 153 01/06/2022 1228   TRIG 197 (H) 01/06/2022 1228   HDL 41 01/06/2022 1228   CHOLHDL 3.7 01/06/2022 1228   CHOLHDL 4.2 11/12/2021 0554   VLDL 47 (H) 11/12/2021 0554   LDLCALC 79 01/06/2022 1228   LDLDIRECT 60.0 04/13/2018 0926    EEG normal with couple of episodes of leg shaking captured without EEG correlate.  Imaging I have reviewed the images obtained:  CT-head: No acute intracranial process.  Assessment:  88 year old man with past medical history of anxiety, depression,  left-sided Bell's palsy with mild residual left facial weakness, severe COPD, coronary artery disease, complete heart block status post PPM placement, diabetes, diabetic polyneuropathy, hypertension, hyperlipidemia, sleep apnea, ESRD on Monday Wednesday Friday dialysis presenting for evaluation of chest pain that is being managed by cardiology-also complained of episodes of arm and leg shaking that happens up to 6 times every day since July 2023 with no associated aura or no relationship with chest pain anxiety or depression. Spot EEG unremarkable for epileptogenic activity.  It actually did capture the lower extremity tremulousness which was seen without any EEG correlate pointing to the spells being nonepileptic. That said, the multiplicity of these events along with the feeling of not being able to understand what people are saying at that time makes one concerned for underlying complex partial seizures. He is scheduled for coronary angiography in the morning for evaluation of his cardiac symptoms and possible in-stent thrombosis.  I would like to get a prolonged EEG to evaluate his complaints further.  Differentials include complex partial seizures versus adventitious movements due to metabolic derangements but I would not expect those movements to be episodic if the metabolic derangements are constant.    Recommendations: Medical management of the cardiac complaints per primary team and cardiology I would like to get a continuous EEG-I am coordinating with the care team to see how to optimally do that-ideally would like to do that uninterrupted but if he is planned for a coronary intervention, can probably plan on continuous EEG to be hooked up after he is back from the Cath Lab. Maintain seizure precautions No  AEDs for now Neurology will follow. Preliminary plan was discussed with the admitting team by the daytime neurologist.  -- Amie Portland, MD Neurologist [facility] Pager:  [phone]

## 2022-03-31 NOTE — Evaluation (Signed)
Occupational Therapy Evaluation Patient Details Name: Rodney Farley MRN: [id number] DOB: Jul 08, 1932 Today's Date: 03/31/2022   History of Present Illness Pt is an 88 y/o male presenting with chest pain, SOB and tremors. Initial EEG negative with further overnight EEG indicated. Admitted for further work up and possible heart cath. PMH: ESRD on HD MWF, AICD, Bell's Palsy, BPH, COPD, CAD s/p MI, GERD, DM, neuropathy, gout, HTN, shingles, pancreatitis, TIA   Clinical Impression   PTA, pt lives alone, typically Modified Independent with ADLs, basic IADLs and mobility using walker. Pt presents now with deficits in chest discomfort, cardiopulmonary tolerance, and overall strength. Pt requires Min A for rolling side to side on stretcher though noted with involuntary body shaking and increasing dyspnea during this task so unable to safely attempt EOB/OOB today. Pt requires Min A for UB ADL and Max-Total A for LB ADLs bed level at this time. Based on current presentation, would recommend SNF rehab but will follow acutely to monitor progress and update recommendations as appropriate.   HR 90s, SpO2 98% on 2 L O2 BP WFL RR up to 41 with activity       Recommendations for follow up therapy are one component of a multi-disciplinary discharge planning process, led by the attending physician.  Recommendations may be updated based on patient status, additional functional criteria and insurance authorization.   Follow Up Recommendations  Skilled nursing-short term rehab (<3 hours/day) (pending progress with OOB ADL)    Assistance Recommended at Discharge Frequent or constant Supervision/Assistance  Patient can return home with the following A lot of help with walking and/or transfers;A lot of help with bathing/dressing/bathroom    Functional Status Assessment  Patient has had a recent decline in their functional status and demonstrates the ability to make significant improvements in function in a  reasonable and predictable amount of time.  Equipment Recommendations  BSC/3in1 (pending progress)    Recommendations for Other Services       Precautions / Restrictions Precautions Precautions: Fall Precaution Comments: monitor RR/SOB Restrictions Weight Bearing Restrictions: No      Mobility Bed Mobility Overal bed mobility: Needs Assistance Bed Mobility: Rolling Rolling: Min assist         General bed mobility comments: Min A to roll side to side on stretcher with linen change. with initial roll to R side, pt with involuntary body shaking. Due to SOB and chest tightness, did not attempt EOB    Transfers                   General transfer comment: deferred at this time      Balance Overall balance assessment:  (to be further assessed)                                         ADL either performed or assessed with clinical judgement   ADL Overall ADL's : Needs assistance/impaired Eating/Feeding: Independent;Sitting   Grooming: Set up;Bed level   Upper Body Bathing: Minimal assistance;Bed level   Lower Body Bathing: Maximal assistance;Bed level   Upper Body Dressing : Minimal assistance;Bed level   Lower Body Dressing: Bed level;Total assistance       Toileting- Clothing Manipulation and Hygiene: Total assistance;Bed level         General ADL Comments: Limited to bed level d/t involuntary shaking with rolling on stretcher and significant SOB with activity. Emphasis on  breathing techniques and avoiding holding breath with activity     Vision Baseline Vision/History: 1 Wears glasses Ability to See in Adequate Light: 0 Adequate Patient Visual Report: No change from baseline Vision Assessment?: No apparent visual deficits     Perception     Praxis      Pertinent Vitals/Pain Pain Assessment Pain Assessment: Faces Faces Pain Scale: Hurts little more Pain Location: chest Pain Descriptors / Indicators: Tightness,  Pressure Pain Intervention(s): Monitored during session, Limited activity within patient's tolerance, Other (comment) (Rn aware)     Hand Dominance Right   Extremity/Trunk Assessment Upper Extremity Assessment Upper Extremity Assessment: Overall WFL for tasks assessed   Lower Extremity Assessment Lower Extremity Assessment: Defer to PT evaluation       Communication Communication Communication: HOH   Cognition Arousal/Alertness: Awake/alert Behavior During Therapy: WFL for tasks assessed/performed Overall Cognitive Status: Within Functional Limits for tasks assessed                                       General Comments  HR, BP and SpO2 on 2 L O2 WFL. RR up to 41 with SOB/activity but normalized down to 20s with breathing cues and rest break    Exercises     Shoulder Instructions      Home Living Family/patient expects to be discharged to:: Private residence Living Arrangements: Alone Available Help at Discharge: Family;Available PRN/intermittently Type of Home: Apartment Home Access: Level entry     Home Layout: One level     Bathroom Shower/Tub: Teacher, early years/pre: Standard     Home Equipment: Conservation officer, nature (2 wheels);Rollator (4 wheels);Shower seat;Grab bars - tub/shower          Prior Functioning/Environment Prior Level of Function : Independent/Modified Independent;History of Falls (last six months)             Mobility Comments: Uses rolling walker or rollator when out. Mostly furniture walker inside ADLs Comments: Reports Mod I for ADLs, basic IADLs. sponge bathes on bad days but typically able to get in shower and use shower chair        OT Problem List: Decreased strength;Impaired balance (sitting and/or standing);Decreased activity tolerance;Cardiopulmonary status limiting activity;Pain      OT Treatment/Interventions: Self-care/ADL training;Therapeutic exercise;Energy conservation;DME and/or AE  instruction;Therapeutic activities;Patient/family education;Balance training    OT Goals(Current goals can be found in the care plan section) Acute Rehab OT Goals Patient Stated Goal: improve breathing/chest discomfort OT Goal Formulation: With patient Time For Goal Achievement: 04/13/22 Potential to Achieve Goals: Good  OT Frequency: Min 2X/week    Co-evaluation PT/OT/SLP Co-Evaluation/Treatment: Yes Reason for Co-Treatment: Complexity of the patient's impairments (multi-system involvement);For patient/therapist safety PT goals addressed during session: Mobility/safety with mobility;Balance OT goals addressed during session: ADL's and self-care;Strengthening/ROM      AM-PAC OT "6 Clicks" Daily Activity     Outcome Measure Help from another person eating meals?: None Help from another person taking care of personal grooming?: A Little Help from another person toileting, which includes using toliet, bedpan, or urinal?: Total Help from another person bathing (including washing, rinsing, drying)?: A Lot Help from another person to put on and taking off regular upper body clothing?: A Little Help from another person to put on and taking off regular lower body clothing?: Total 6 Click Score: 14   End of Session Equipment Utilized During Treatment: Oxygen Nurse Communication: Mobility  status  Activity Tolerance: Treatment limited secondary to medical complications (Comment) Patient left: in bed;with call bell/phone within reach  OT Visit Diagnosis: Other abnormalities of gait and mobility (R26.89);Unsteadiness on feet (R26.81);Muscle weakness (generalized) (M62.81)                Time: 4360-6770 OT Time Calculation (min): 14 min Charges:  OT General Charges $OT Visit: 1 Visit OT Evaluation $OT Eval Low Complexity: 1 Low  Malachy Chamber, OTR/L Acute Rehab Services Office: [phone]   Layla Maw 03/31/2022, 11:18 AM

## 2022-03-31 NOTE — Assessment & Plan Note (Signed)
-   Defer heparin to Cardiology - Continue aspirin, atorvastatin, Plavix - Consult Cardiology, plan to go to [facility] today

## 2022-03-31 NOTE — ED Notes (Signed)
Pt requesting to speak with chaplain. This RN called chaplain and they will come to speak with pt.

## 2022-03-31 NOTE — Assessment & Plan Note (Signed)
-   COnsult Nephrology for routine HD after cath

## 2022-03-31 NOTE — Assessment & Plan Note (Signed)
-   Volume control with dialysis - Consult Cardiology, appreciate cares

## 2022-03-31 NOTE — Assessment & Plan Note (Signed)
BMI 34 

## 2022-03-31 NOTE — ED Notes (Addendum)
Pt developed CP after sliding up in the bed. This RN provided pt with nitro and supplemental O2. [facility] team aware.

## 2022-03-31 NOTE — Progress Notes (Signed)
Progress Note  Patient Name: Rodney Farley Date of Encounter: 03/31/2022  Primary Cardiologist: Dr Sora [name]  Subjective   Patient states that he has experienced some recurrent chest pain since early this morning and took a total of 3 sublingual nitroglycerin pills taken 1 at a time due to his recurrent discomfort.  Currently he is pain-free.  Anticipating repeat cardiac catheterization later today.  Inpatient Medications    Scheduled Meds:  aspirin EC  81 mg Oral Daily   atorvastatin  80 mg Oral Daily   Chlorhexidine Gluconate Cloth  6 each Topical Q0600   clopidogrel  75 mg Oral Daily   fentaNYL (SUBLIMAZE) injection  50 mcg Intravenous Once   gabapentin  300 mg Oral BID   insulin aspart  0-6 Units Subcutaneous Q4H   midodrine  5-10 mg Oral See admin instructions   pantoprazole  40 mg Oral Daily   sevelamer carbonate  1,600 mg Oral TID WC   sodium chloride flush  3 mL Intravenous Q12H   Continuous Infusions:  sodium chloride     sodium chloride 10 mL/hr at 03/31/22 0511   PRN Meds: sodium chloride, acetaminophen **OR** acetaminophen, albuterol, HYDROcodone-acetaminophen, metoCLOPramide (REGLAN) injection, nitroGLYCERIN, sevelamer carbonate, sodium chloride flush   Vital Signs    Vitals:   03/31/22 1115 03/31/22 1130 03/31/22 1200 03/31/22 1205  BP: (!) 148/82 134/73 (!) 143/70   Pulse: 79 85 79 86  Resp: 18 20 (!) 31 18  Temp:      TempSrc:      SpO2: 99% 96% 95% 93%  Weight:      Height:       No intake or output data in the 24 hours ending 03/31/22 1208  I/O since admission:   Filed Weights   03/30/22 1412  Weight: 98.4 kg    Telemetry    A-V  paced rhythm at 80 bpm personally Reviewed  ECG    ECG (independently read by me): AV paced rhythm at 81 bpm   Physical Exam    BP (!) 143/70   Pulse 86   Temp 97.9 F (36.6 C) (Oral)   Resp 18   Ht 5\' 7"  (1.702 m)   Wt 98.4 kg   SpO2 93%   BMI 33.99 kg/m  General: Alert,  oriented, no distress.  Skin: normal turgor, no rashes, warm and dry HEENT: Normocephalic, atraumatic. Pupils equal round and reactive to light; sclera anicteric; extraocular muscles intact;  Nose without nasal septal hypertrophy Mouth/Parynx benign; Mallinpatti scale Neck: Thick neck; no JVD, no carotid bruits; normal carotid upstroke Lungs: clear to ausculatation and percussion; no wheezing or rales Chest wall: without tenderness to palpitation Heart: PMI not displaced, RRR, s1 s2 normal, 4-7/4 systolic murmur, no diastolic murmur, no rubs, gallops, thrills, or heaves Abdomen: soft, nontender; no hepatosplenomehaly, BS+; abdominal aorta nontender and not dilated by palpation. Back: no CVA tenderness Pulses 2+ Musculoskeletal: full range of motion, normal strength, no joint deformities Extremities: no clubbing cyanosis or edema, Homan's sign negative  Neurologic: grossly nonfocal; Cranial nerves grossly wnl Psychologic: Normal mood and affect     Labs    Chemistry Recent Labs  Lab 03/30/22 1438 03/31/22 0000  NA 140 140  K 5.1 5.3*  CL 97* 100  CO2 29 28  GLUCOSE 152* 151*  BUN 34* 37*  CREATININE 8.66* 8.97*  CALCIUM 9.5 9.0  PROT 6.9 5.9*  ALBUMIN 3.8 3.3*  AST 23 18  ALT 25 22  ALKPHOS  77 67  BILITOT 0.7 0.8  GFRNONAA 5* 5*  ANIONGAP 14 12     Hematology Recent Labs  Lab 03/30/22 1438 03/30/22 2108 03/31/22 0000  WBC 7.9  --  5.8  RBC 3.05* 2.78* 2.64*  HGB 10.6*  --  9.3*  HCT 32.1*  --  28.8*  MCV 105.2*  --  109.1*  MCH 34.8*  --  35.2*  MCHC 33.0  --  32.3  RDW 14.9  --  14.9  PLT 126*  --  117*   HS Troponin: 54 > 57 >   59  BNPNo results for input(s): "BNP", "PROBNP" in the last 168 hours.   DDimer No results for input(s): "DDIMER" in the last 168 hours.   Lipid Panel     Component Value Date/Time   CHOL 153 01/06/2022 1228   TRIG 197 (H) 01/06/2022 1228   HDL 41 01/06/2022 1228   CHOLHDL 3.7 01/06/2022 1228   CHOLHDL 4.2 11/12/2021  0554   VLDL 47 (H) 11/12/2021 0554   LDLCALC 79 01/06/2022 1228   LDLDIRECT 60.0 04/13/2018 0926     Radiology    Overnight EEG with video  Result Date: 03/31/2022 Lora Havens, MD     03/31/2022  9:37 AM Patient Name: Rodney Farley MRN: [id number] Epilepsy Attending: Lora Havens Referring Physician/Provider: Amie Portland, MD Duration: 03/31/2022 0325 to 0930  Patient history: 88yo M with seizure like activity. EEG to evaluate for seizure  Level of alertness: Awake, asleep  AEDs during EEG study: None  Technical aspects: This EEG study was done with scalp electrodes positioned according to the 10-20 International system of electrode placement. Electrical activity was reviewed with band pass filter of 1-70Hz , sensitivity of 7 uV/mm, display speed of 55mm/sec with a 60Hz  notched filter applied as appropriate. EEG data were recorded continuously and digitally stored.  Video monitoring was available and reviewed as appropriate.  Description: The posterior dominant rhythm consists of 9 Hz activity of moderate voltage (25-35 uV) seen predominantly in posterior head regions, symmetric and reactive to eye opening and eye closing. Sleep was characterized by vertex waves, sleep spindles (12-14hz ), maximal fronto-central region. Hyperventilation and photic stimulation were not performed.    IMPRESSION: This study is within normal limits. No seizures or epileptiform discharges were seen throughout the recording.  Lora Havens    EEG adult  Result Date: 03/30/2022 Lora Havens, MD     03/30/2022  6:58 PM Patient Name: Rodney Farley MRN: [id number] Epilepsy Attending: Lora Havens Referring Physician/Provider: Kerney Elbe, MD Date: 03/30/2022 Duration: 23.33 mins Patient history: 88yo M with seizure like activity. EEG to evaluate for seizure Level of alertness: Awake AEDs during EEG study: None Technical aspects: This EEG study was done with scalp electrodes positioned according  to the 10-20 International system of electrode placement. Electrical activity was reviewed with band pass filter of 1-70Hz , sensitivity of 7 uV/mm, display speed of 81mm/sec with a 60Hz  notched filter applied as appropriate. EEG data were recorded continuously and digitally stored.  Video monitoring was available and reviewed as appropriate. Description: The posterior dominant rhythm consists of 9 Hz activity of moderate voltage (25-35 uV) seen predominantly in posterior head regions, symmetric and reactive to eye opening and eye closing. Patient was noted to have episodes of bilateral lower extremity twitching. Concomitant EEG before, during and after the event showed normal posterior dominant rhythm.  Hyperventilation and photic stimulation were not performed.   IMPRESSION: This study is within  normal limits. No seizures or epileptiform discharges were seen throughout the recording. Patient was noted to have episodes of bilateral lower extremity twitching without concomitant EEG change. These episodes were most likely NOT epileptic Lora Havens   DG Chest Port 1 View  Result Date: 03/30/2022 CLINICAL DATA:  Chest pain during hemodialysis. EXAM: PORTABLE CHEST 1 VIEW COMPARISON:  Radiographs 02/08/2022 and 01/21/2022.  CT 02/03/2022. FINDINGS: 1624 hours. Multiple left subclavian AICD leads are grossly stable. The heart is enlarged. There is aortic atherosclerosis and diffuse pulmonary interstitial prominence, similar to the most recent study. No confluent airspace opacity, pneumothorax or significant pleural effusion. The bones appear unremarkable. Several telemetry leads overlie the chest. IMPRESSION: Stable cardiomegaly and diffuse interstitial pulmonary prominence which may reflect mild edema. Electronically Signed   By: Richardean Sale M.D.   On: 03/30/2022 16:43   CT Head Wo Contrast  Result Date: 03/30/2022 CLINICAL DATA:  New onset seizure, altered level of consciousness EXAM: CT HEAD WITHOUT  CONTRAST TECHNIQUE: Contiguous axial images were obtained from the base of the skull through the vertex without intravenous contrast. RADIATION DOSE REDUCTION: This exam was performed according to the departmental dose-optimization program which includes automated exposure control, adjustment of the mA and/or kV according to patient size and/or use of iterative reconstruction technique. COMPARISON:  04/01/2019 FINDINGS: Brain: Stable hypodensities throughout the periventricular white matter and basal ganglia, consistent with chronic small vessel ischemic change. No signs of acute infarct or hemorrhage. The lateral ventricles and remaining midline structures are unremarkable. No acute extra-axial fluid collections. No mass effect. Vascular: Stable atherosclerosis.  No hyperdense vessel. Skull: Normal. Negative for fracture or focal lesion. Sinuses/Orbits: Stable postsurgical changes bilateral globes. Otherwise the orbits and paranasal sinuses are unremarkable. Other: None. IMPRESSION: 1. Stable head CT, no acute intracranial process. Electronically Signed   By: Randa Ngo M.D.   On: 03/30/2022 16:14    Cardiac Studies   L and R HC on 02/09/22:   Conclusions: Significant single-vessel coronary artery disease with 90% in-stent restenosis (minimal luminal area 2.9 mm) at site of stent placement in 11/2021 and repeat angioplasty in 12/2021. Moderate LCx disease that appears stable from prior catheterizations. Stable 70% proximal stenosis of nondominant RCA Patent D2 stent with 30% stenosis at the ostium of D2 before the stent.  Reported OM stent is not clearly visualized. Moderately to severely elevated left and right heart filling pressures (PCWP 33 mmHg, mean RA 14 mmHg). Normal to supranormal cardiac output, likely accentuated by dialysis fistula. Successful PTCA with intracoronary lithotripsy of the ostial/proximal LAD in-stent restenosis with reduction in stenosis from 90% to 20% and improvement in  flow from TIMI-2 to TIMI-3.  Additional stent placement at the ostium of the LAD was not pursued given inability to completely expand the vessel despite lithotripsy and high-pressure angioplasty.   Recommendations: Remove right femoral artery and venous sheaths after ACT has fallen below 175 seconds with manual compression. Continue indefinite dual antiplatelet therapy with aspirin and clopidogrel. Aggressive secondary prevention of coronary artery disease. Proceed with hemodialysis later today, if possible, given elevated filling pressures. Limited echo to reassess LVEF and aortic stenosis.     RA (mean): 14 mmHg RV (S/EDP): 57/15 mmHg PA (S/D, mean): 57/30 (39) mmHg PCWP (mean): 33 mmHg  Ao sat: 92% PA sat: 72%  Fick CO: 10.6 L/min Fick CI: 5.0 L/min/m^2  Thermodilution CO: 6.5 L/min Thermodilution CI: 3.1 L/min/m^2      Intervention      Echo from 11/11/21:  1. Left ventricular ejection fraction, by estimation, is 45 to 50%. The  left ventricle has mildly decreased function. The left ventricle  demonstrates regional wall motion abnormalities (see scoring  diagram/findings for description). The left ventricular   internal cavity size was mildly dilated. There is mild left ventricular  hypertrophy. Left ventricular diastolic parameters are consistent with  Grade I diastolic dysfunction (impaired relaxation).   2. Right ventricular systolic function is normal. The right ventricular  size is normal. Tricuspid regurgitation signal is inadequate for assessing  PA pressure.   3. Left atrial size was moderately dilated.   4. The mitral valve is degenerative. Trivial mitral valve regurgitation.  No evidence of mitral stenosis.   5. The aortic valve was not well visualized. Aortic valve regurgitation  is not visualized. Moderate aortic valve stenosis. Vmax 2.9 m/s, MG  4mmHg, AVA 1.2 cm^2, DI 0.38     Patient Profile     88 y.o. male who has a PMH of CAD with multiple  stents and intervention, ischemic cardiomyopathy, chronic systolic heart failure with recovering EF, moderate aortic stenosis,  complete heart block s/p ICD-CRT, atrial fibrillation, HLD, hypotension, type 2 DM, ESRD on HD, OSA, COPD, TIA, BPH, who was seen 03/30/2022 for the evaluation of chest pain at the request of Harris, Abigail PA-C.     Assessment & Plan    CAD with history of multiple stents and intervention.  Patient's most recent cardiac catheterization on February 09, 2022 demonstrated ostial proximal LAD in-stent restenosis which was treated with successful PCI with shockwave lithotripsy.  The patient initially had done well but over the past 3 to 4 weeks has developed recurrent anginal symptomatology.  Troponins are minimally increased not consistent with ACS.  ECG is not diagnostic secondary to paced rhythm.  With his recurrent symptomatology, he is scheduled to undergo repeat cardiac catheterization later today.  This morning he experienced 3 short-lived episodes of chest pain and took 3 sublingual nitroglycerin in total.  Will initiate low-dose IV nitroglycerin presently in anticipation of his catheterization later today. Ischemic cardiomyopathy.  Echo Doppler study in May 2023 showed EF 45 to 50% with wall motion abnormality involving the apical septum apical inferior and apex with akinesis.  Previously had been on carvedilol but this was stopped secondary to low blood pressure. End-stage kidney disease, on dialysis for the past 3 years.  Yesterday he was noted to have hand shaking and tremoring during dialysis leading to his evaluation and ending dialysis prior to its completion. History of complete heart block, status post ICD-CRT with recent generator change out in March 2023 followed by Dr. Caryl Comes. Aortic Valve Stenosis: moderate stenosis noted on May 2023 cath with AVA 1.2 cm. Question seizure-like activity: EEG performed, the study is within normal limits with no seizures or epileptiform  discharges throughout the recording   Patient is aware of the risk benefits of the catheterization procedure.  Plan cath later today.  Signed, Troy Sine, MD, [facility] 03/31/2022, 12:08 PM

## 2022-03-31 NOTE — Assessment & Plan Note (Signed)
Not on AC.  Rate controlled.

## 2022-04-01 ENCOUNTER — Encounter (HOSPITAL_COMMUNITY): Payer: Self-pay | Admitting: Interventional Cardiology

## 2022-04-01 ENCOUNTER — Ambulatory Visit (HOSPITAL_COMMUNITY): Payer: PPO

## 2022-04-01 DIAGNOSIS — G253 Myoclonus: Secondary | ICD-10-CM | POA: Diagnosis not present

## 2022-04-01 DIAGNOSIS — I5023 Acute on chronic systolic (congestive) heart failure: Secondary | ICD-10-CM | POA: Diagnosis not present

## 2022-04-01 DIAGNOSIS — Z515 Encounter for palliative care: Secondary | ICD-10-CM

## 2022-04-01 DIAGNOSIS — I200 Unstable angina: Secondary | ICD-10-CM | POA: Diagnosis not present

## 2022-04-01 DIAGNOSIS — N186 End stage renal disease: Secondary | ICD-10-CM | POA: Diagnosis not present

## 2022-04-01 DIAGNOSIS — Z7189 Other specified counseling: Secondary | ICD-10-CM

## 2022-04-01 DIAGNOSIS — Z992 Dependence on renal dialysis: Secondary | ICD-10-CM | POA: Diagnosis not present

## 2022-04-01 DIAGNOSIS — I442 Atrioventricular block, complete: Secondary | ICD-10-CM | POA: Diagnosis not present

## 2022-04-01 LAB — HEPATIC FUNCTION PANEL
ALT: 21 U/L (ref 0–44)
AST: 17 U/L (ref 15–41)
Albumin: 3.3 g/dL — ABNORMAL LOW (ref 3.5–5.0)
Alkaline Phosphatase: 79 U/L (ref 38–126)
Bilirubin, Direct: 0.2 mg/dL (ref 0.0–0.2)
Indirect Bilirubin: 0.7 mg/dL (ref 0.3–0.9)
Total Bilirubin: 0.9 mg/dL (ref 0.3–1.2)
Total Protein: 6.4 g/dL — ABNORMAL LOW (ref 6.5–8.1)

## 2022-04-01 LAB — BASIC METABOLIC PANEL
Anion gap: 14 (ref 5–15)
BUN: 22 mg/dL (ref 8–23)
CO2: 30 mmol/L (ref 22–32)
Calcium: 9.5 mg/dL (ref 8.9–10.3)
Chloride: 95 mmol/L — ABNORMAL LOW (ref 98–111)
Creatinine, Ser: 6.22 mg/dL — ABNORMAL HIGH (ref 0.61–1.24)
GFR, Estimated: 8 mL/min — ABNORMAL LOW (ref >60)
Glucose, Bld: 112 mg/dL — ABNORMAL HIGH (ref 70–99)
Potassium: 4.6 mmol/L (ref 3.5–5.1)
Sodium: 139 mmol/L (ref 135–145)

## 2022-04-01 LAB — GLUCOSE, CAPILLARY
Glucose-Capillary: 116 mg/dL — ABNORMAL HIGH (ref 70–99)
Glucose-Capillary: 103 mg/dL — ABNORMAL HIGH (ref 70–99)
Glucose-Capillary: 106 mg/dL — ABNORMAL HIGH (ref 70–99)
Glucose-Capillary: 117 mg/dL — ABNORMAL HIGH (ref 70–99)
Glucose-Capillary: 162 mg/dL — ABNORMAL HIGH (ref 70–99)
Glucose-Capillary: 105 mg/dL — ABNORMAL HIGH (ref 70–99)
Glucose-Capillary: 157 mg/dL — ABNORMAL HIGH (ref 70–99)

## 2022-04-01 LAB — CBC
HCT: 30.5 % — ABNORMAL LOW (ref 39.0–52.0)
Hemoglobin: 10.3 g/dL — ABNORMAL LOW (ref 13.0–17.0)
MCH: 35 pg — ABNORMAL HIGH (ref 26.0–34.0)
MCHC: 33.8 g/dL (ref 30.0–36.0)
MCV: 103.7 fL — ABNORMAL HIGH (ref 80.0–100.0)
Platelets: 125 10*3/uL — ABNORMAL LOW (ref 150–400)
RBC: 2.94 MIL/uL — ABNORMAL LOW (ref 4.22–5.81)
RDW: 15 % (ref 11.5–15.5)
WBC: 8.4 10*3/uL (ref 4.0–10.5)
nRBC: 0 % (ref 0.0–0.2)

## 2022-04-01 MED ORDER — MIDODRINE HCL 5 MG PO TABS
10.0000 mg | ORAL_TABLET | ORAL | Status: DC
  Administered 2022-04-01: 10 mg via ORAL
  Filled 2022-04-01: qty 2

## 2022-04-01 MED ORDER — MIDODRINE HCL 5 MG PO TABS: 10.0000 mg | ORAL_TABLET | Freq: Every day | ORAL | Status: DC | PRN

## 2022-04-01 MED ORDER — MIDODRINE HCL 5 MG PO TABS: 10.0000 mg | ORAL_TABLET | ORAL | Status: DC

## 2022-04-01 MED ORDER — CLONAZEPAM 0.25 MG PO TBDP
0.2500 mg | ORAL_TABLET | Freq: Two times a day (BID) | ORAL | Status: DC
  Administered 2022-04-01 – 2022-04-02 (×3): 0.25 mg via ORAL
  Filled 2022-04-01 (×3): qty 1

## 2022-04-01 MED ORDER — MIDODRINE HCL 5 MG PO TABS: 5.0000 mg | ORAL_TABLET | Freq: Every day | ORAL | Status: DC | PRN

## 2022-04-01 NOTE — Progress Notes (Addendum)
Rounding Note    Patient Name: Rodney Farley Date of Encounter: 04/01/2022  [facility] Cardiologist: Jaece [name], MD   Subjective   Feeling well this morning. No chest pain. Continuous EEG in process.   Inpatient Medications    Scheduled Meds:  aspirin EC  81 mg Oral Daily   atorvastatin  80 mg Oral Daily   clopidogrel  75 mg Oral Daily   fentaNYL (SUBLIMAZE) injection  50 mcg Intravenous Once   gabapentin  300 mg Oral BID   insulin aspart  0-6 Units Subcutaneous Q4H   midodrine  10 mg Oral Q M,W,F   pantoprazole  40 mg Oral Daily   sevelamer carbonate  1,600 mg Oral TID WC   sodium chloride flush  3 mL Intravenous Q12H   sodium chloride flush  3 mL Intravenous Q12H   sodium chloride flush  3 mL Intravenous Q12H   Continuous Infusions:  sodium chloride     sodium chloride     nitroGLYCERIN Stopped (03/31/22 1337)   PRN Meds: sodium chloride, sodium chloride, acetaminophen **OR** acetaminophen, albuterol, HYDROcodone-acetaminophen, metoCLOPramide (REGLAN) injection, midodrine, nitroGLYCERIN, ondansetron (ZOFRAN) IV, sevelamer carbonate, sodium chloride flush, sodium chloride flush   Vital Signs    Vitals:   04/01/22 0216 04/01/22 0247 04/01/22 0352 04/01/22 0744  BP: (!) 94/42 (!) 92/48 (!) 95/46 (!) 84/45  Pulse: 68  69 67  Resp: 18 18 18 18   Temp: 99.6 F (37.6 C) 97.7 F (36.5 C)  98.2 F (36.8 C)  TempSrc: Oral Oral  Oral  SpO2: 96%  92% 93%  Weight:      Height:        Intake/Output Summary (Last 24 hours) at 04/01/2022 0819 Last data filed at 04/01/2022 0216 Gross per 24 hour  Intake --  Output 2600 ml  Net -2600 ml      03/31/2022   10:10 PM 03/31/2022    2:33 PM 03/30/2022    2:12 PM  Last 3 Weights  Weight (lbs) 219 lb 9.3 oz 219 lb 5.7 oz 217 lb  Weight (kg) 99.6 kg 99.5 kg 98.431 kg      Telemetry    AV paced - Personally Reviewed  ECG    No new tracing  Physical Exam   GEN: Pleasant older male. No acute  distress.   Neck: No JVD Cardiac: RRR, 2/6 systolic murmur LUSB, no rubs, or gallops.  Respiratory: Clear to auscultation bilaterally. GI: Soft, nontender, non-distended  MS: No edema; No deformity. Wrapped AV fistula to right upper arm. Right femoral cath site stable.  Neuro:  Nonfocal  Psych: Normal affect   Labs    High Sensitivity Troponin:   Recent Labs  Lab 03/30/22 1438 03/30/22 2108 03/30/22 2332  TROPONINIHS 54* 57* 59*     Chemistry Recent Labs  Lab 03/30/22 1438 03/31/22 0000 04/01/22 0411  NA 140 140 139  K 5.1 5.3* 4.6  CL 97* 100 95*  CO2 29 28 30   GLUCOSE 152* 151* 112*  BUN 34* 37* 22  CREATININE 8.66* 8.97* 6.22*  CALCIUM 9.5 9.0 9.5  MG 2.0  --   --   PROT 6.9 5.9*  --   ALBUMIN 3.8 3.3*  --   AST 23 18  --   ALT 25 22  --   ALKPHOS 77 67  --   BILITOT 0.7 0.8  --   GFRNONAA 5* 5* 8*  ANIONGAP 14 12 14     Lipids No results for  input(s): "CHOL", "TRIG", "HDL", "LABVLDL", "LDLCALC", "CHOLHDL" in the last 168 hours.  Hematology Recent Labs  Lab 03/30/22 1438 03/30/22 2108 03/31/22 0000 04/01/22 0411  WBC 7.9  --  5.8 8.4  RBC 3.05* 2.78* 2.64* 2.94*  HGB 10.6*  --  9.3* 10.3*  HCT 32.1*  --  28.8* 30.5*  MCV 105.2*  --  109.1* 103.7*  MCH 34.8*  --  35.2* 35.0*  MCHC 33.0  --  32.3 33.8  RDW 14.9  --  14.9 15.0  PLT 126*  --  117* 125*   Thyroid No results for input(s): "TSH", "FREET4" in the last 168 hours.  BNPNo results for input(s): "BNP", "PROBNP" in the last 168 hours.  DDimer No results for input(s): "DDIMER" in the last 168 hours.   Radiology    CARDIAC CATHETERIZATION  Result Date: 03/31/2022   Ost RCA to Prox RCA lesion is 70% stenosed.   Prox Cx lesion is 30% stenosed.   Mid Cx to Dist Cx lesion is 40% stenosed.   Mid Cx lesion is 30% stenosed.   2nd Diag-1 lesion is 30% stenosed.   3rd Mrg lesion is 50% stenosed.   Non-stenotic 2nd Diag-2 lesion was previously treated.   Dist LM to Ost LAD lesion is 90% stenosed. In stent  restenosis.   Scoring balloon angioplasty was performed using a BALLN WOLVERINE 3.50X10, followed by PTCA with 4.0  balloon.   Post intervention, there is a 0% residual stenosis. Continue dual antiplatelet therapy.  I spoke with [name], his son, regarding the results of the procedure.  Same proximal LAD area continues with recurrent restenosis.  Cutting Balloon used on this occasion.  Perhaps of drug-coated balloon was available at some point, that would be an option. Plan is for dialysis tomorrow, per the son.   Overnight EEG with video  Result Date: 03/31/2022 Lora Havens, MD     03/31/2022  9:37 AM Patient Name: Rodney Farley MRN: [id number] Epilepsy Attending: Lora Havens Referring Physician/Provider: Amie Portland, MD Duration: 03/31/2022 0325 to 0930  Patient history: 88yo M with seizure like activity. EEG to evaluate for seizure  Level of alertness: Awake, asleep  AEDs during EEG study: None  Technical aspects: This EEG study was done with scalp electrodes positioned according to the 10-20 International system of electrode placement. Electrical activity was reviewed with band pass filter of 1-70Hz , sensitivity of 7 uV/mm, display speed of 67mm/sec with a 60Hz  notched filter applied as appropriate. EEG data were recorded continuously and digitally stored.  Video monitoring was available and reviewed as appropriate.  Description: The posterior dominant rhythm consists of 9 Hz activity of moderate voltage (25-35 uV) seen predominantly in posterior head regions, symmetric and reactive to eye opening and eye closing. Sleep was characterized by vertex waves, sleep spindles (12-14hz ), maximal fronto-central region. Hyperventilation and photic stimulation were not performed.    IMPRESSION: This study is within normal limits. No seizures or epileptiform discharges were seen throughout the recording.  Lora Havens    EEG adult  Result Date: 03/30/2022 Lora Havens, MD     03/30/2022   6:58 PM Patient Name: Rodney Farley MRN: [id number] Epilepsy Attending: Lora Havens Referring Physician/Provider: Kerney Elbe, MD Date: 03/30/2022 Duration: 23.33 mins Patient history: 88yo M with seizure like activity. EEG to evaluate for seizure Level of alertness: Awake AEDs during EEG study: None Technical aspects: This EEG study was done with scalp electrodes positioned according to the 10-20 International system  of electrode placement. Electrical activity was reviewed with band pass filter of 1-70Hz , sensitivity of 7 uV/mm, display speed of 45mm/sec with a 60Hz  notched filter applied as appropriate. EEG data were recorded continuously and digitally stored.  Video monitoring was available and reviewed as appropriate. Description: The posterior dominant rhythm consists of 9 Hz activity of moderate voltage (25-35 uV) seen predominantly in posterior head regions, symmetric and reactive to eye opening and eye closing. Patient was noted to have episodes of bilateral lower extremity twitching. Concomitant EEG before, during and after the event showed normal posterior dominant rhythm.  Hyperventilation and photic stimulation were not performed.   IMPRESSION: This study is within normal limits. No seizures or epileptiform discharges were seen throughout the recording. Patient was noted to have episodes of bilateral lower extremity twitching without concomitant EEG change. These episodes were most likely NOT epileptic Lora Havens   DG Chest Port 1 View  Result Date: 03/30/2022 CLINICAL DATA:  Chest pain during hemodialysis. EXAM: PORTABLE CHEST 1 VIEW COMPARISON:  Radiographs 02/08/2022 and 01/21/2022.  CT 02/03/2022. FINDINGS: 1624 hours. Multiple left subclavian AICD leads are grossly stable. The heart is enlarged. There is aortic atherosclerosis and diffuse pulmonary interstitial prominence, similar to the most recent study. No confluent airspace opacity, pneumothorax or significant pleural  effusion. The bones appear unremarkable. Several telemetry leads overlie the chest. IMPRESSION: Stable cardiomegaly and diffuse interstitial pulmonary prominence which may reflect mild edema. Electronically Signed   By: Richardean Sale M.D.   On: 03/30/2022 16:43   CT Head Wo Contrast  Result Date: 03/30/2022 CLINICAL DATA:  New onset seizure, altered level of consciousness EXAM: CT HEAD WITHOUT CONTRAST TECHNIQUE: Contiguous axial images were obtained from the base of the skull through the vertex without intravenous contrast. RADIATION DOSE REDUCTION: This exam was performed according to the departmental dose-optimization program which includes automated exposure control, adjustment of the mA and/or kV according to patient size and/or use of iterative reconstruction technique. COMPARISON:  04/01/2019 FINDINGS: Brain: Stable hypodensities throughout the periventricular white matter and basal ganglia, consistent with chronic small vessel ischemic change. No signs of acute infarct or hemorrhage. The lateral ventricles and remaining midline structures are unremarkable. No acute extra-axial fluid collections. No mass effect. Vascular: Stable atherosclerosis.  No hyperdense vessel. Skull: Normal. Negative for fracture or focal lesion. Sinuses/Orbits: Stable postsurgical changes bilateral globes. Otherwise the orbits and paranasal sinuses are unremarkable. Other: None. IMPRESSION: 1. Stable head CT, no acute intracranial process. Electronically Signed   By: Randa Ngo M.D.   On: 03/30/2022 16:14    Cardiac Studies   Cath: 03/31/22    Ost RCA to Prox RCA lesion is 70% stenosed.   Prox Cx lesion is 30% stenosed.   Mid Cx to Dist Cx lesion is 40% stenosed.   Mid Cx lesion is 30% stenosed.   2nd Diag-1 lesion is 30% stenosed.   3rd Mrg lesion is 50% stenosed.   Non-stenotic 2nd Diag-2 lesion was previously treated.   Dist LM to Ost LAD lesion is 90% stenosed. In stent restenosis.   Scoring balloon  angioplasty was performed using a BALLN WOLVERINE 3.50X10, followed by PTCA with 4.0 [place] balloon.   Post intervention, there is a 0% residual stenosis.   Continue dual antiplatelet therapy.  I spoke with [name], his son, regarding the results of the procedure.  Same proximal LAD area continues with recurrent restenosis.  Cutting Balloon used on this occasion.  Perhaps of drug-coated balloon was available at some point,  that would be an option.   Plan is for dialysis tomorrow, per the son.  Diagnostic Dominance: Right  Intervention    Patient Profile     88 y.o. male with a PMH of CAD with multiple stents and intervention, ischemic cardiomyopathy, chronic systolic heart failure with recovering EF, moderate aortic stenosis,  complete heart block s/p ICD-CRT, atrial fibrillation, HLD, hypotension, type 2 DM, ESRD on HD, OSA, COPD, TIA, BPH, who was seen 03/30/2022 for the evaluation of chest pain at the request of Margarita Mail PA-C.   Assessment & Plan    CAD Mildly elevated troponin -- High-sensitivity troponin 54>> 57>> 59.  Underwent cardiac catheterization noted above with scoring balloon angioplasty to in-stent restenosis of proximal LAD which continues to have recurrent restenosis.  Recommendations to continue on DAPT with aspirin/Plavix for minimum of 1 year. -- Continue aspirin, Plavix, atorvastatin 80 mg daily  ICM -- Known LVEF of 45 to 50%.  Volume management per HD.  Had previously been on carvedilol but stopped secondary to low blood pressures.  GDMT limited with hypotension and ESRD.  ESRD on HD -- Nephrology following  Hx of CHB s/p ICD-CRT -- GEN change 09/2021, followed by Dr. Caryl Comes  Moderate aortic stenosis -- AVA 1.2 cm on cath 11/2021  Possible seizure-like activity -- initial EEG within normal limits, no seizure or epileptic discharges noted on recording. Repeat EEG in process. -- Per neurology   For questions or updates, please contact [place] Please consult [email or web address] for contact info under        Signed, Reino Bellis, NP  04/01/2022, 8:19 AM      Patient seen and examined. Agree with assessment and plan.  Patient feels well.  No recurrent chest pain.  No shortness of breath.  Angiograms were reviewed following procedure yesterday with Dr. Irish Lack.  Patient underwent scoring balloon angioplasty and ultimate high-pressure balloon dilatation with a 4.0 mm noncompliant balloon with excellent angiographic result.  Unfortunately, patient has had recurrent restenosis in the past.  Hopefully this will remain stable and not restenosis once again.  Would recommend long-term DAPT indefinitely beyond 1 year as long as stable due to location of his recent restenosis.  Continue aggressive lipid-lowering therapy with target LDL less than 55.  Right groin catheterization site stable.  From a cardiac standpoint, patient is okay for discharge later today long as stable.  EEG still in progress.  Plan for patient to follow-up with his primary cardiologist, Dr. Wilkin [name].   Troy Sine, MD, [facility] 04/01/2022 10:34 AM

## 2022-04-01 NOTE — Progress Notes (Signed)
Subjective:s/p balloon angioplasty. No jerking overnight.  Personal bedside, he had an episode when coming out of the grocery store recently with he had twitching of both lower extremities which stopped once his son brought his attention..  Per son he has been noticing intermittent twitching of bilateral upper and/or lower extremities for the last few months.  Most recent episode happened while patient was at dialysis which is why he was sent to the emergency room.  Son did not witness the event.  However the.  Dialysis told him that patient had jerking of bilateral upper and lower extremities, patient was awake and able to converse during the episode.  ROS: negative except above  Examination  Vital signs in last 24 hours: Temp:  [97.7 F (36.5 C)-99.6 F (37.6 C)] 98.2 F (36.8 C) (09/20 0744) Pulse Rate:  [64-98] 67 (09/20 0744) Resp:  [14-31] 18 (09/20 0744) BP: (80-182)/(33-155) 84/45 (09/20 0744) SpO2:  [87 %-100 %] 93 % (09/20 0744) Weight:  [99.5 kg-99.6 kg] 99.6 kg (09/19 2210)  General: lying in bed, NAD Neuro: MS: Alert, oriented, follows commands CN: pupils equal and reactive,  EOMI, face symmetric, tongue midline, normal sensation over face, Motor: 5/5 strength in all 4 extremities Coordination: normal Gait: not tested  Basic Metabolic Panel: Recent Labs  Lab 03/30/22 1438 03/31/22 0000 04/01/22 0411  NA 140 140 139  K 5.1 5.3* 4.6  CL 97* 100 95*  CO2 29 28 30   GLUCOSE 152* 151* 112*  BUN 34* 37* 22  CREATININE 8.66* 8.97* 6.22*  CALCIUM 9.5 9.0 9.5  MG 2.0  --   --     CBC: Recent Labs  Lab 03/30/22 1438 03/31/22 0000 04/01/22 0411  WBC 7.9 5.8 8.4  NEUTROABS 4.8  --   --   HGB 10.6* 9.3* 10.3*  HCT 32.1* 28.8* 30.5*  MCV 105.2* 109.1* 103.7*  PLT 126* 117* 125*     Coagulation Studies: No results for input(s): "LABPROT", "INR" in the last 72 hours.  Imaging CT head without contrast 03/30/2022: Stable head CT, no acute intracranial  process.  ASSESSMENT AND PLAN: 88 year old male with history of left-sided Bell's palsy with mild residual left facial weakness, severe COPD, coronary artery disease, complete heart block status post pacemaker, diabetes, diabetic polyneuropathy, hypertension, hyperlipidemia, end-stage renal disease on hemodialysis MWF who presented with chest pain and is now status post angioplasty.  While in the hospital patient was also noted to have episodes of arm and leg shaking happening up to 6 times every day and he is on video EEG for characterization of these spells.  Myoclonus -No evidence of epileptiform abnormality on EEG so far.  The semiology of the episodes is most typical of myoclonus.  Recommendations -We will continue EEG for another 24 hours.  If remains within normal limits, can DC -Consider switching gabapentin to pregabalin.  However patient has allergy documented to pregabalin (led to hallucinations).  Therefore we will start clonazepam 0.25 mg twice daily -We will order MRI C-spine to look for any spinal causes of myoclonus.  Patient does report having cold implanted in his left eye.  Therefore if patient is unable to obtain MRI, can obtain CT C-spine without contrast due to ESRD -Also recommend follow-up with movement disorder doctor at [facility] in 2 to 3 months -Patient HBc episodes do not interfere with his driving and has never happened during driving.  As I do not suspect seizures, I am not putting driving restrictions on him.  However I did  discuss and recommend outpatient driving evaluation.   I have spent a total of  40  minutes with the patient reviewing hospital notes,  test results, labs and examining the patient as well as establishing an assessment and plan that was discussed personally with the patient and son at bedside.  > 50% of time was spent in direct patient care.   [name] Epilepsy [facility] For questions after 5pm please refer to AMION to reach  the Neurologist on call

## 2022-04-01 NOTE — TOC Progression Note (Signed)
Transition of Care [facility]) - Progression Note    Patient Details  Name: Rodney Farley MRN: [id number] Date of Birth: 1932/04/28  Transition of [facility] CM/SW Contact  Graves-Bigelow, Ocie Cornfield, RN Phone Number: 04/01/2022, 3:15 PM  Clinical Narrative: Case Manager received a message from the [place] that the patient wants to return home instead of SNF once stable. Case Manager did receive verbal permission to call son Rodney Farley to discuss disposition plan of care. Case Manager left a secure voicemail with Rodney Farley-awaiting call back.   Expected Discharge Plan: [place] Barriers to Discharge: Continued Medical Work up, SNF Pending bed offer  Expected Discharge Plan and Services Expected Discharge Plan: [place] arrangements for the past 2 months: Single Family Home                 Readmission Risk Interventions     No data to display

## 2022-04-01 NOTE — Consult Note (Signed)
Palliative Care Consult Note                                  Date: 04/01/2022   Patient Name: Rodney Farley  DOB: 1932/02/22  MRN: [id number]  Age / Sex: 88 y.o., male  PCP: [name], MD Referring Physician: Aline August, MD  Reason for Consultation: Establishing goals of care  HPI/Patient Profile: 88 y.o. male  with past medical history of ESRD on HD, COPD, chronic systolic CHF, CHB s/p PPM, T2DM, hypertension, and OSA.  He presented to [facility] ED on 03/30/2022 with worsening shortness of breath and chest pain.  He was also reported to have episode of whole body shaking during dialysis.  He was admitted to [facility] with unstable angina and tremors.  Cardiology, neurology, and nephrology were consulted.  High sensitivity troponin 54, 57, and 59.  He underwent cardiac catheterization on 9/19 with balloon angioplasty to stent restenosis of proximal LAD.  Palliative medicine has been consulted for goals of care.  Past Medical History:  Diagnosis Date   AICD (automatic cardioverter/defibrillator) present    Medtronic pacer   Anemia    Anxiety and depression    Arthritis    Bell's palsy    left side. after shingles episode   BPH associated with nocturia    Chronic systolic CHF (congestive heart failure) ([place])    EF normalized by Echo 2019   COPD (chronic obstructive pulmonary disease) ([place])    Severe   Coronary artery disease    a. s/p MI in 1994/1995 while in [place] s/p questionable PCI. 03/2015: progression of disease, for staged PCI.   Diabetic peripheral neuropathy (HCC)    GERD (gastroesophageal reflux disease)    Gout    Hard of hearing    B/L   History of chronic pancreatitis 07/23/2017   noted on CT abd/pelvis   History of shingles    Hypercholesterolemia    Hypertension    Obesity    Sleep apnea    "sleeps w/humidifyer when he panics and gets short of breath" (04/08/2015)   TIA (transient ischemic attack) X 3    Trigger middle finger of left hand    Type II diabetes mellitus ([place])    Wears glasses    Wears hearing aid     Subjective:   I have reviewed medical records including progress notes, labs and imaging, and met with patient at bedside to discuss [place].  Continuous EEG is in place. Rodney Farley tells me that the neurologist has assured him he has not had a seizure. He has no acute complaints, and reports he has not had chest pain since the cardiac cath yesterday.   I introduced Palliative Medicine as specialized medical care for people living with serious illness. It focuses on providing relief from the symptoms and stress of a serious illness.   A brief life review was discussed. Rodney Farley is originally from [place]. He has 1 son Rodney Farley. He shares that his first wife (Paul's mother) died 57 days ago, and that Rodney Farley will be traveling to [facility] for the service and to help get things in order.   Rodney Farley lives in an apartment in [place]. Rodney Farley lives 3-4 minutes away and is very involved. At baseline, Rodney Farley is ambulatory and moderately independent with his basic ADLs. He uses a rolling walker when out of the home.   We discussed Rodney Farley's medical course over the past  few months. He shares yesterday was his 4th cardiac catheterization this year. He was previously hospitalized and underwent catheterization in May, June, and July.  We discussed Rodney Farley's current clinical status and what it means in the larger context of his ongoing co-morbidities. We reviewed his multiple medical problems including ESRD, CAD, heart failure, and diabetes.  Provided education on the natural trajectory of chronic illness, emphasizing that it is non-curable and progressive. Discussed that chronic illness results in decreased functional status over time, as patients may not return to previous baseline after having a major illness.   Values and goals of care important to patient were discussed. He values family and faith. Rodney Farley tell me  that his long-term goal is simple - he has 5 great-granddaughters (ages 52 to 88), and he wants to be able to dance with each of them at their wedding.   His short-term goal is to return home. We discussed that PT/OT is recommending rehab. Rodney Farley states that he would much prefer to have PT services at home.   A discussion was had today regarding advanced directives. Concepts specific to code status, artifical feeding and hydration, continued IV antibiotics and rehospitalization was had.    MOST form was completed 09/02/21 and is on file in [place]. I have reviewed this with Rodney Farley and he does not wish to make any changes at this time.  The following treatment decisions have been outlined:  Cardiopulmonary Resuscitation: Do Not Attempt Resuscitation (DNR/No CPR)  Medical Interventions: Full Scope of Treatment: Use intubation, advanced airway interventions, mechanical ventilation, cardioversion as indicated, medical treatment, IV fluids, etc, also provide comfort measures. Transfer to the hospital if indicated  Antibiotics: Antibiotics if indicated  IV Fluids: IV fluids for a defined trial period  Feeding Tube: No feeding tube   Rodney Farley confirms that he would be willing to be intubated for a time trial only; he would not want prolonged mechanical ventilation if he was not clinically improving.   Discussed the importance of continued conversation with patient ad his medical providers regarding overall plan of care and treatment options.    Review of Systems  All other systems reviewed and are negative.   Objective:   Primary Diagnoses: Present on Admission:  Unstable angina (HCC)  Hyperkalemia, diminished renal excretion  Hypotension  Hyperlipidemia  Atrioventricular block, complete (HCC)  Acute on chronic systolic CHF (congestive heart failure) (HCC)  Atrial fibrillation (HCC) - SCAF  Iron deficiency anemia, unspecified  Tremor  Obesity (BMI 30-39.9)   Physical Exam Vitals reviewed.   Constitutional:      General: He is not in acute distress.    Appearance: He is obese.     Comments: Chronically ill-appearing  Pulmonary:     Effort: Pulmonary effort is normal.  Neurological:     Mental Status: He is alert and oriented to person, place, and time.     Vital Signs:  BP (!) 106/43 (BP Location: Left Arm)   Pulse 80   Temp 97.6 F (36.4 C) (Oral)   Resp 18   Ht '5\' 7"'  (1.702 m)   Wt 99.6 kg   SpO2 94%   BMI 34.39 kg/m   Palliative Assessment/Data: PPS 50%     Assessment & Plan:   SUMMARY OF RECOMMENDATIONS   Code status changed to reflect patient's wishes not to have CPR Continue current full scope interventions Patient would be ok with intubation for a time trial only, but would not want prolonged mechanical ventilation  PT/OT has recommended rehab,  however patient prefers to have PT services at home  Code status - PARTIAL  Primary Decision Maker: PATIENT Surrogate decision maker would be his son Rodney Farley  Prognosis:  Unable to determine  Discharge Planning:  To Be Determined    Thank you for allowing [place] to participate in the care of Rodney Farley  MDM - High   Signed by: Elie Confer, NP Palliative Medicine Team  Team Phone # [phone]  For individual providers, please see AMION

## 2022-04-01 NOTE — Progress Notes (Signed)
[facility] Progress Note   Subjective: Patient sitting up at bedside asking about "this head gear"(continue EEG leads). Has H/O essential tremors as OP. Denies chest pain at present. AV pacing on monitor. Very pleasant as always. Very HOH.   Objective Vitals:   04/01/22 0216 04/01/22 0247 04/01/22 0352 04/01/22 0744  BP: (!) 94/42 (!) 92/48 (!) 95/46 (!) 84/45  Pulse: 68  69 67  Resp: 18 18 18 18   Temp: 99.6 F (37.6 C) 97.7 F (36.5 C)  98.2 F (36.8 C)  TempSrc: Oral Oral  Oral  SpO2: 96%  92% 93%  Weight:      Height:       Physical Exam General: Pleasant older male in NAD Heart: S1,S2 RRR AV pacing on monitor. 2/6 systolic M.  Lungs: CTAB A/P Abdomen: obese, NABS Extremities: No LE edema Neuro: No tremors at rest. Oriented X 3 Dialysis Access: R AVG+ T/B   Additional Objective Labs: Basic Metabolic Panel: Recent Labs  Lab 03/30/22 1438 03/31/22 0000 04/01/22 0411  NA 140 140 139  K 5.1 5.3* 4.6  CL 97* 100 95*  CO2 29 28 30   GLUCOSE 152* 151* 112*  BUN 34* 37* 22  CREATININE 8.66* 8.97* 6.22*  CALCIUM 9.5 9.0 9.5   Liver Function Tests: Recent Labs  Lab 03/30/22 1438 03/31/22 0000  AST 23 18  ALT 25 22  ALKPHOS 77 67  BILITOT 0.7 0.8  PROT 6.9 5.9*  ALBUMIN 3.8 3.3*   No results for input(s): "LIPASE", "AMYLASE" in the last 168 hours. CBC: Recent Labs  Lab 03/30/22 1438 03/31/22 0000 04/01/22 0411  WBC 7.9 5.8 8.4  NEUTROABS 4.8  --   --   HGB 10.6* 9.3* 10.3*  HCT 32.1* 28.8* 30.5*  MCV 105.2* 109.1* 103.7*  PLT 126* 117* 125*   Blood Culture    Component Value Date/Time   SDES URINE, CLEAN CATCH 04/07/2019 1931   SPECREQUEST  04/07/2019 1931    NONE Performed at [facility], [employer] [address], [place], [place] [address]    CULT >=100,000 COLONIES/mL PSEUDOMONAS AERUGINOSA (A) 04/07/2019 1931   REPTSTATUS 04/09/2019 FINAL 04/07/2019 1931    Cardiac Enzymes: No results for input(s): "CKTOTAL", "CKMB",  "CKMBINDEX", "TROPONINI" in the last 168 hours. CBG: Recent Labs  Lab 03/31/22 1848 03/31/22 2131 04/01/22 0152 04/01/22 0356 04/01/22 0740  GLUCAP 93 116* 103* 106* 117*   Iron Studies:  Recent Labs    03/30/22 2108  IRON 63  TIBC 238*  FERRITIN 1,330*   @lablastinr3 @ Studies/Results: CARDIAC CATHETERIZATION  Addendum Date: 04/01/2022     Ost RCA to Prox RCA lesion is 70% stenosed.   Prox Cx lesion is 30% stenosed.   Mid Cx to Dist Cx lesion is 40% stenosed.   Mid Cx lesion is 30% stenosed.   2nd Diag-1 lesion is 30% stenosed.   3rd Mrg lesion is 50% stenosed.   Non-stenotic 2nd Diag-2 lesion was previously treated.   Dist LM to Ost LAD lesion is 90% stenosed. In stent restenosis.   Scoring balloon angioplasty was performed using a BALLN WOLVERINE 3.50X10, followed by PTCA with 4.0 [place] balloon.   Post intervention, there is a 20% residual stenosis. Continue dual antiplatelet therapy.  I spoke with [name], his son, regarding the results of the procedure.  Same proximal LAD area continues with recurrent restenosis.  Cutting Balloon used on this occasion.  Perhaps of drug-coated balloon was available at some point, that would be an option.  Plan is for dialysis tomorrow, per the son.  Result Date: 04/01/2022   Ost RCA to Prox RCA lesion is 70% stenosed.   Prox Cx lesion is 30% stenosed.   Mid Cx to Dist Cx lesion is 40% stenosed.   Mid Cx lesion is 30% stenosed.   2nd Diag-1 lesion is 30% stenosed.   3rd Mrg lesion is 50% stenosed.   Non-stenotic 2nd Diag-2 lesion was previously treated.   Dist LM to Ost LAD lesion is 90% stenosed. In stent restenosis.   Scoring balloon angioplasty was performed using a BALLN WOLVERINE 3.50X10, followed by PTCA with 4.0  balloon.   Post intervention, there is a 0% residual stenosis. Continue dual antiplatelet therapy.  I spoke with [name], his son, regarding the results of the procedure.  Same proximal LAD area continues with recurrent restenosis.  Cutting Balloon  used on this occasion.  Perhaps of drug-coated balloon was available at some point, that would be an option. Plan is for dialysis tomorrow, per the son.   Overnight EEG with video  Result Date: 03/31/2022 Lora Havens, MD     04/01/2022  9:00 AM Patient Name: Rodney Farley MRN: [id number] Epilepsy Attending: Lora Havens Referring Physician/Provider: Amie Portland, MD Duration: 03/31/2022 0325 to 04/01/2022 0325  Patient history: 88yo M with seizure like activity. EEG to evaluate for seizure  Level of alertness: Awake, asleep  AEDs during EEG study: None  Technical aspects: This EEG study was done with scalp electrodes positioned according to the 10-20 International system of electrode placement. Electrical activity was reviewed with band pass filter of 1-70Hz , sensitivity of 7 uV/mm, display speed of 49mm/sec with a 60Hz  notched filter applied as appropriate. EEG data were recorded continuously and digitally stored.  Video monitoring was available and reviewed as appropriate.  Description: The posterior dominant rhythm consists of 9 Hz activity of moderate voltage (25-35 uV) seen predominantly in posterior head regions, symmetric and reactive to eye opening and eye closing. Sleep was characterized by vertex waves, sleep spindles (12-14hz ), maximal fronto-central region. Hyperventilation and photic stimulation were not performed.    IMPRESSION: This study is within normal limits. No seizures or epileptiform discharges were seen throughout the recording.  Lora Havens    EEG adult  Result Date: 03/30/2022 Lora Havens, MD     03/30/2022  6:58 PM Patient Name: Rodney Farley MRN: [id number] Epilepsy Attending: Lora Havens Referring Physician/Provider: Kerney Elbe, MD Date: 03/30/2022 Duration: 23.33 mins Patient history: 88yo M with seizure like activity. EEG to evaluate for seizure Level of alertness: Awake AEDs during EEG study: None Technical aspects: This EEG study was done  with scalp electrodes positioned according to the 10-20 International system of electrode placement. Electrical activity was reviewed with band pass filter of 1-70Hz , sensitivity of 7 uV/mm, display speed of 78mm/sec with a 60Hz  notched filter applied as appropriate. EEG data were recorded continuously and digitally stored.  Video monitoring was available and reviewed as appropriate. Description: The posterior dominant rhythm consists of 9 Hz activity of moderate voltage (25-35 uV) seen predominantly in posterior head regions, symmetric and reactive to eye opening and eye closing. Patient was noted to have episodes of bilateral lower extremity twitching. Concomitant EEG before, during and after the event showed normal posterior dominant rhythm.  Hyperventilation and photic stimulation were not performed.   IMPRESSION: This study is within normal limits. No seizures or epileptiform discharges were seen throughout the recording. Patient was noted to have episodes  of bilateral lower extremity twitching without concomitant EEG change. These episodes were most likely NOT epileptic Lora Havens   DG Chest Port 1 View  Result Date: 03/30/2022 CLINICAL DATA:  Chest pain during hemodialysis. EXAM: PORTABLE CHEST 1 VIEW COMPARISON:  Radiographs 02/08/2022 and 01/21/2022.  CT 02/03/2022. FINDINGS: 1624 hours. Multiple left subclavian AICD leads are grossly stable. The heart is enlarged. There is aortic atherosclerosis and diffuse pulmonary interstitial prominence, similar to the most recent study. No confluent airspace opacity, pneumothorax or significant pleural effusion. The bones appear unremarkable. Several telemetry leads overlie the chest. IMPRESSION: Stable cardiomegaly and diffuse interstitial pulmonary prominence which may reflect mild edema. Electronically Signed   By: Richardean Sale M.D.   On: 03/30/2022 16:43   CT Head Wo Contrast  Result Date: 03/30/2022 CLINICAL DATA:  New onset seizure, altered  level of consciousness EXAM: CT HEAD WITHOUT CONTRAST TECHNIQUE: Contiguous axial images were obtained from the base of the skull through the vertex without intravenous contrast. RADIATION DOSE REDUCTION: This exam was performed according to the departmental dose-optimization program which includes automated exposure control, adjustment of the mA and/or kV according to patient size and/or use of iterative reconstruction technique. COMPARISON:  04/01/2019 FINDINGS: Brain: Stable hypodensities throughout the periventricular white matter and basal ganglia, consistent with chronic small vessel ischemic change. No signs of acute infarct or hemorrhage. The lateral ventricles and remaining midline structures are unremarkable. No acute extra-axial fluid collections. No mass effect. Vascular: Stable atherosclerosis.  No hyperdense vessel. Skull: Normal. Negative for fracture or focal lesion. Sinuses/Orbits: Stable postsurgical changes bilateral globes. Otherwise the orbits and paranasal sinuses are unremarkable. Other: None. IMPRESSION: 1. Stable head CT, no acute intracranial process. Electronically Signed   By: Randa Ngo M.D.   On: 03/30/2022 16:14   Medications:  sodium chloride     sodium chloride     nitroGLYCERIN Stopped (03/31/22 1337)    aspirin EC  81 mg Oral Daily   atorvastatin  80 mg Oral Daily   clopidogrel  75 mg Oral Daily   gabapentin  300 mg Oral BID   insulin aspart  0-6 Units Subcutaneous Q4H   midodrine  10 mg Oral Q M,W,F   pantoprazole  40 mg Oral Daily   sevelamer carbonate  1,600 mg Oral TID WC   sodium chloride flush  3 mL Intravenous Q12H   sodium chloride flush  3 mL Intravenous Q12H   sodium chloride flush  3 mL Intravenous Q12H     HD orders: NW MWF 4 hrs 180NRe 400/500 97 kg 2.0K/2.0 Ca UFP 2 AVF  -Heparin 2500 units IV initial bolus 1000 units IV mid run -Mircera 75 mcg IV q 4 weeks (last dose 03/16/2022) -Venofer 50 mg IV q week -Calcitriol 1.5 mcg PO TIW    Assessment/Plan: 1.  [place]/CAD-S/P PTA to proximal LAD 03/31/2022.  Will require DAPT 1 year. Per Cardiology.  2. ICM-Last EF 45-50%. Optimize volume on HD.  3. ESRD -MWF. Completed HD early this AM. Next HD 04/03/2022.  4. Anemia - HGB 10.3. Follow HGB.  5. Secondary hyperparathyroidism - continue sevelamer binders, VDRA. Add RFP to today's labs.  6. HTN/volume - Chronic hypotension on midodrine. Continue midodrine-may repeat dose mid run if needed. UF as tolerated. Net UF last PM 2.6 liters.  7. Nutrition - Checking albumin today. Renal/Carb mod diet. Add nepro for now.  8. DMT2-per primary 9. Tremor-W/U per neurology/primary  Jimmye Norman. Myking Sar NP-C 04/01/2022, 11:10 AM  [employer] [phone]

## 2022-04-01 NOTE — TOC Progression Note (Signed)
Transition of Care [facility]) - Progression Note    Patient Details  Name: Rodney Farley MRN: [id number] Date of Birth: [birth year]  Transition of Care [facility]) CM/SW [place], [place] Phone Number: 04/01/2022, 3:32 PM  Clinical Narrative:     CSW spoke with patient at bedside to discuss dc plan SNF vrs. Going home with [facility] services. CSW provided patient with medicare compare SNF ratings list with accepted SNF bed offers. Patient reports he wants to return home. Patient request for CSW to speak with his son while in room. CSW spoke with patients son [name] by phone while in room with patient regarding dc plan for patient. [name] is also in agreement with patient returning back home with home health services when medically ready for dc. [name] reports patient will have 24/7 supervision from him and his spouse [name]. CSW informed patient and [name] that [name] CM will follow up with patient and his son to help assist with setting up [facility] services for patient. Patient and patients son thanked CSW. All questions answered. No further questions reported at this time. CSW informed CM.  Expected Discharge Plan: [place] Barriers to Discharge: Continued Medical Work up, SNF Pending bed offer  Expected Discharge Plan and Services Expected Discharge Plan: [place] arrangements for the past 2 months: Single Family Home                                       Social Determinants of Health (SDOH) Interventions    Readmission Risk Interventions     No data to display

## 2022-04-01 NOTE — Progress Notes (Signed)
Occupational Therapy Treatment Patient Details Name: Rodney Farley MRN: [id number] DOB: [birth year] Today's Date: 04/01/2022   History of present illness Pt is an 88 y/o male presenting with chest pain, SOB and tremors. Initial EEG negative with further overnight EEG indicated. Admitted for further work up and possible heart cath. PMH: ESRD on HD MWF, AICD, Bell's Palsy, BPH, COPD, CAD s/p MI, GERD, DM, neuropathy, gout, HTN, shingles, pancreatitis, TIA   OT comments  Patient currently undergoing 24 hour EEG, so mobility limited to in room.  Patient reports no chest pain, and appears to be moving much better than upon eval.  No shaking/tremors noted to upper or lower extremities with mobility.  Patient is very close to Mod I with bed mobility, and was generalized supervision for in room mobility at RW level.  Patient able to change his socks with out assist sitting at the edge of the bed.  Patient is wanting to go home, and is open to [facility] rehab.  SNF will still be recommended for now, but if he continues to progress post EEG, home with [facility] may be reasonable.      Recommendations for follow up therapy are one component of a multi-disciplinary discharge planning process, led by the attending physician.  Recommendations may be updated based on patient status, additional functional criteria and insurance authorization.    Follow Up Recommendations  Other (comment) ([facility] OT versus SNF.  Depending on progress post EEG)    Assistance Recommended at Discharge Intermittent Supervision/Assistance  Patient can return home with the following  Assist for transportation   Equipment Recommendations  None recommended by OT    Recommendations for Other Services      Precautions / Restrictions Precautions Precautions: Fall Precaution Comments: 24 hour EEG Restrictions Weight Bearing Restrictions: No       Mobility Bed Mobility Overal bed mobility: Modified Independent                   Transfers Overall transfer level: Needs assistance Equipment used: Rolling walker (2 wheels) Transfers: Sit to/from Stand, Bed to chair/wheelchair/BSC Sit to Stand: Supervision     Step pivot transfers: Supervision           Balance Overall balance assessment: Needs assistance Sitting-balance support: Feet supported Sitting balance-Leahy Scale: Good     Standing balance support: Reliant on assistive device for balance Standing balance-Leahy Scale: Fair                             ADL either performed or assessed with clinical judgement   ADL                   Upper Body Dressing : Set up;Sitting   Lower Body Dressing: Supervision/safety;Sit to/from stand                      Extremity/Trunk Assessment Upper Extremity Assessment Upper Extremity Assessment: Overall WFL for tasks assessed   Lower Extremity Assessment Lower Extremity Assessment: Defer to PT evaluation   Cervical / Trunk Assessment Cervical / Trunk Assessment: Normal                      Cognition Arousal/Alertness: Awake/alert Behavior During Therapy: WFL for tasks assessed/performed Overall Cognitive Status: Within Functional Limits for tasks assessed  General Comments  VSS on RA    Pertinent Vitals/ Pain       Pain Assessment Pain Assessment: No/denies pain Pain Intervention(s): Monitored during session                                                          Frequency  Min 2X/week        Progress Toward Goals  OT Goals(current goals can now be found in the care plan section)  Progress towards OT goals: Progressing toward goals  Acute Rehab OT Goals Patient Stated Goal: return home OT Goal Formulation: With patient Time For Goal Achievement: 04/13/22 Potential to Achieve Goals: Good  Plan Discharge plan needs to be updated     Co-evaluation                 AM-PAC OT "6 Clicks" Daily Activity     Outcome Measure   Help from another person eating meals?: None   Help from another person toileting, which includes using toliet, bedpan, or urinal?: A Little Help from another person bathing (including washing, rinsing, drying)?: A Little Help from another person to put on and taking off regular upper body clothing?: None Help from another person to put on and taking off regular lower body clothing?: A Little 6 Click Score: 17    End of Session Equipment Utilized During Treatment: Rolling walker (2 wheels)  OT Visit Diagnosis: Unsteadiness on feet (R26.81)   Activity Tolerance Patient tolerated treatment well   Patient Left in bed;with call bell/phone within reach   Nurse Communication          Time: 1610-9604 OT Time Calculation (min): 18 min  Charges: OT General Charges $OT Visit: 1 Visit OT Treatments $Self Care/Home Management : 8-22 mins  04/01/2022  Rodney Farley, Rodney Farley  Acute Rehabilitation Services  Office:  [phone]   Metta Clines 04/01/2022, 4:12 PM

## 2022-04-01 NOTE — Progress Notes (Addendum)
PROGRESS NOTE    Rodney Farley  CMK:[id number] DOB: 06/27/1932 DOA: 03/30/2022 PCP: [name], MD   Brief Narrative:  88 y.o. M with hx ESRD on HD, COPD, chronic systolic CHF, CHB s/p PPM, DM type II, HTN, OSA and obesity presented with worsening shortness of breath and chest pain.  He also was found to have new episodes of whole body shaking.  Cardiology, neurology and nephrology were consulted.  Assessment & Plan:   Unstable angina/mildly elevated troponin CAD Positive troponins Hyperlipidemia -High-sensitivity troponins 54, then 57 and 59.  Status post cardiac catheterization and balloon angioplasty to in-stent restenosis of proximal LAD.  Continue aspirin, Plavix, statin.  Follow further cardiology recommendations -Currently chest pain-free.  Tremors -?  Neurology following for possible seizure.  Spot EEG normal.  LTM EEG pending.  Follow further neurology recommendations.  Chronic chronic systolic heart failure/ischemic cardiomyopathy -Strict input and output.  Daily weights.  Volume management as per nephrology by dialysis  End-stage renal disease on hemodialysis -Nephrology following.  Dialysis as per nephrology schedule  Chronic hypotension -Continue midodrine  Hyperkalemia -Improved  Anemia of chronic disease/iron deficiency anemia -From renal failure.  Hemoglobin stable.  Monitor intermittently  Thrombocytopenia -No signs of bleeding.  Monitor  Moderate aortic stenosis -Cardiology following  History of complete heart block status post ICD placement -Outpatient follow-up with EP  Subclinical atrial fibrillation -Noted on prior device check in 2021.  Not on anticoagulation due to low atrial fibrillation burden as per cardiology. -Currently rate controlled.  Generalized deconditioning -PT recommended SNF placement.  TOC following.  Goals of care -Consult palliative care for goals of care discussion.  Currently listed as full  code  Obesity -Outpatient follow-up   DVT prophylaxis: SCDs Code Status: Full Family Communication: None at bedside Disposition Plan: Status is: Inpatient Remains inpatient appropriate because: Of severity of illness  Consultants: Cardiology/nephrology/neurology  Procedures: Cardiac catheterization on 03/31/2022.  EEG  Antimicrobials: None   Subjective: Patient seen and examined at bedside.  Poor historian.  Denies any current chest pain.  No seizures, vomiting, fever reported.  Objective: Vitals:   04/01/22 0216 04/01/22 0247 04/01/22 0352 04/01/22 0744  BP: (!) 94/42 (!) 92/48 (!) 95/46 (!) 84/45  Pulse: 68  69 67  Resp: 18 18 18 18   Temp: 99.6 F (37.6 C) 97.7 F (36.5 C)  98.2 F (36.8 C)  TempSrc: Oral Oral  Oral  SpO2: 96%  92% 93%  Weight:      Height:        Intake/Output Summary (Last 24 hours) at 04/01/2022 1159 Last data filed at 04/01/2022 0216 Gross per 24 hour  Intake --  Output 2600 ml  Net -2600 ml   Filed Weights   03/30/22 1412 03/31/22 1433 03/31/22 2210  Weight: 98.4 kg 99.5 kg 99.6 kg    Examination:  General exam: Appears calm and comfortable.  Elderly male lying in bed.  Currently on room air. Respiratory system: Bilateral decreased breath sounds at bases with some scattered crackles Cardiovascular system: S1 & S2 heard, Rate controlled Gastrointestinal system: Abdomen is obese, nondistended, soft and nontender. Normal bowel sounds heard. Extremities: No cyanosis, clubbing; trace lower extremity edema present Central nervous system: Awake, slow to respond.  Poor historian.  No focal neurological deficits. Moving extremities Skin: No rashes, lesions or ulcers Psychiatry: Flat affect.  No signs of agitation.   Data Reviewed: I have personally reviewed following labs and imaging studies  CBC: Recent Labs  Lab 03/30/22 1438 03/31/22  0000 04/01/22 0411  WBC 7.9 5.8 8.4  NEUTROABS 4.8  --   --   HGB 10.6* 9.3* 10.3*  HCT 32.1*  28.8* 30.5*  MCV 105.2* 109.1* 103.7*  PLT 126* 117* 371*   Basic Metabolic Panel: Recent Labs  Lab 03/30/22 1438 03/31/22 0000 04/01/22 0411  NA 140 140 139  K 5.1 5.3* 4.6  CL 97* 100 95*  CO2 29 28 30   GLUCOSE 152* 151* 112*  BUN 34* 37* 22  CREATININE 8.66* 8.97* 6.22*  CALCIUM 9.5 9.0 9.5  MG 2.0  --   --    GFR: Estimated Creatinine Clearance: 9.2 mL/min (A) (by C-G formula based on SCr of 6.22 mg/dL (H)). Liver Function Tests: Recent Labs  Lab 03/30/22 1438 03/31/22 0000  AST 23 18  ALT 25 22  ALKPHOS 77 67  BILITOT 0.7 0.8  PROT 6.9 5.9*  ALBUMIN 3.8 3.3*   No results for input(s): "LIPASE", "AMYLASE" in the last 168 hours. No results for input(s): "AMMONIA" in the last 168 hours. Coagulation Profile: No results for input(s): "INR", "PROTIME" in the last 168 hours. Cardiac Enzymes: No results for input(s): "CKTOTAL", "CKMB", "CKMBINDEX", "TROPONINI" in the last 168 hours. BNP (last 3 results) No results for input(s): "PROBNP" in the last 8760 hours. HbA1C: No results for input(s): "HGBA1C" in the last 72 hours. CBG: Recent Labs  Lab 03/31/22 2131 04/01/22 0152 04/01/22 0356 04/01/22 0740 04/01/22 1147  GLUCAP 116* 103* 106* 117* 162*   Lipid Profile: No results for input(s): "CHOL", "HDL", "LDLCALC", "TRIG", "CHOLHDL", "LDLDIRECT" in the last 72 hours. Thyroid Function Tests: No results for input(s): "TSH", "T4TOTAL", "FREET4", "T3FREE", "THYROIDAB" in the last 72 hours. Anemia Panel: Recent Labs    03/30/22 2108  VITAMINB12 1,446*  FOLATE 25.4  FERRITIN 1,330*  TIBC 238*  IRON 63  RETICCTPCT 1.9   Sepsis Labs: No results for input(s): "PROCALCITON", "LATICACIDVEN" in the last 168 hours.  No results found for this or any previous visit (from the past 240 hour(s)).       Radiology Studies: CARDIAC CATHETERIZATION  Addendum Date: 04/01/2022     Ost RCA to Prox RCA lesion is 70% stenosed.   Prox Cx lesion is 30% stenosed.   Mid Cx  to Dist Cx lesion is 40% stenosed.   Mid Cx lesion is 30% stenosed.   2nd Diag-1 lesion is 30% stenosed.   3rd Mrg lesion is 50% stenosed.   Non-stenotic 2nd Diag-2 lesion was previously treated.   Dist LM to Ost LAD lesion is 90% stenosed. In stent restenosis.   Scoring balloon angioplasty was performed using a BALLN WOLVERINE 3.50X10, followed by PTCA with 4.0 [place] balloon.   Post intervention, there is a 20% residual stenosis. Continue dual antiplatelet therapy.  I spoke with [name], his son, regarding the results of the procedure.  Same proximal LAD area continues with recurrent restenosis.  Cutting Balloon used on this occasion.  Perhaps of drug-coated balloon was available at some point, that would be an option. Plan is for dialysis tomorrow, per the son.  Result Date: 04/01/2022   Ost RCA to Prox RCA lesion is 70% stenosed.   Prox Cx lesion is 30% stenosed.   Mid Cx to Dist Cx lesion is 40% stenosed.   Mid Cx lesion is 30% stenosed.   2nd Diag-1 lesion is 30% stenosed.   3rd Mrg lesion is 50% stenosed.   Non-stenotic 2nd Diag-2 lesion was previously treated.   Dist LM to [facility] LAD  lesion is 90% stenosed. In stent restenosis.   Scoring balloon angioplasty was performed using a BALLN WOLVERINE 3.50X10, followed by PTCA with 4.0 [place] balloon.   Post intervention, there is a 0% residual stenosis. Continue dual antiplatelet therapy.  I spoke with [name], his son, regarding the results of the procedure.  Same proximal LAD area continues with recurrent restenosis.  Cutting Balloon used on this occasion.  Perhaps of drug-coated balloon was available at some point, that would be an option. Plan is for dialysis tomorrow, per the son.   Overnight EEG with video  Result Date: 03/31/2022 Lora Havens, MD     04/01/2022  9:00 AM Patient Name: Terence Bart MRN: [id number] Epilepsy Attending: Lora Havens Referring Physician/Provider: Amie Portland, MD Duration: 03/31/2022 0325 to 04/01/2022 0325  Patient history:  88yo M with seizure like activity. EEG to evaluate for seizure  Level of alertness: Awake, asleep  AEDs during EEG study: None  Technical aspects: This EEG study was done with scalp electrodes positioned according to the 10-20 International system of electrode placement. Electrical activity was reviewed with band pass filter of 1-70Hz , sensitivity of 7 uV/mm, display speed of 64mm/sec with a 60Hz  notched filter applied as appropriate. EEG data were recorded continuously and digitally stored.  Video monitoring was available and reviewed as appropriate.  Description: The posterior dominant rhythm consists of 9 Hz activity of moderate voltage (25-35 uV) seen predominantly in posterior head regions, symmetric and reactive to eye opening and eye closing. Sleep was characterized by vertex waves, sleep spindles (12-14hz ), maximal fronto-central region. Hyperventilation and photic stimulation were not performed.    IMPRESSION: This study is within normal limits. No seizures or epileptiform discharges were seen throughout the recording.  Lora Havens    EEG adult  Result Date: 03/30/2022 Lora Havens, MD     03/30/2022  6:58 PM Patient Name: Enis Riecke MRN: [id number] Epilepsy Attending: Lora Havens Referring Physician/Provider: Kerney Elbe, MD Date: 03/30/2022 Duration: 23.33 mins Patient history: 88yo M with seizure like activity. EEG to evaluate for seizure Level of alertness: Awake AEDs during EEG study: None Technical aspects: This EEG study was done with scalp electrodes positioned according to the 10-20 International system of electrode placement. Electrical activity was reviewed with band pass filter of 1-70Hz , sensitivity of 7 uV/mm, display speed of 70mm/sec with a 60Hz  notched filter applied as appropriate. EEG data were recorded continuously and digitally stored.  Video monitoring was available and reviewed as appropriate. Description: The posterior dominant rhythm consists of 9 Hz  activity of moderate voltage (25-35 uV) seen predominantly in posterior head regions, symmetric and reactive to eye opening and eye closing. Patient was noted to have episodes of bilateral lower extremity twitching. Concomitant EEG before, during and after the event showed normal posterior dominant rhythm.  Hyperventilation and photic stimulation were not performed.   IMPRESSION: This study is within normal limits. No seizures or epileptiform discharges were seen throughout the recording. Patient was noted to have episodes of bilateral lower extremity twitching without concomitant EEG change. These episodes were most likely NOT epileptic Lora Havens   DG Chest Port 1 View  Result Date: 03/30/2022 CLINICAL DATA:  Chest pain during hemodialysis. EXAM: PORTABLE CHEST 1 VIEW COMPARISON:  Radiographs 02/08/2022 and 01/21/2022.  CT 02/03/2022. FINDINGS: 1624 hours. Multiple left subclavian AICD leads are grossly stable. The heart is enlarged. There is aortic atherosclerosis and diffuse pulmonary interstitial prominence, similar to the most recent study. No  confluent airspace opacity, pneumothorax or significant pleural effusion. The bones appear unremarkable. Several telemetry leads overlie the chest. IMPRESSION: Stable cardiomegaly and diffuse interstitial pulmonary prominence which may reflect mild edema. Electronically Signed   By: Richardean Sale M.D.   On: 03/30/2022 16:43   CT Head Wo Contrast  Result Date: 03/30/2022 CLINICAL DATA:  New onset seizure, altered level of consciousness EXAM: CT HEAD WITHOUT CONTRAST TECHNIQUE: Contiguous axial images were obtained from the base of the skull through the vertex without intravenous contrast. RADIATION DOSE REDUCTION: This exam was performed according to the departmental dose-optimization program which includes automated exposure control, adjustment of the mA and/or kV according to patient size and/or use of iterative reconstruction technique. COMPARISON:   04/01/2019 FINDINGS: Brain: Stable hypodensities throughout the periventricular white matter and basal ganglia, consistent with chronic small vessel ischemic change. No signs of acute infarct or hemorrhage. The lateral ventricles and remaining midline structures are unremarkable. No acute extra-axial fluid collections. No mass effect. Vascular: Stable atherosclerosis.  No hyperdense vessel. Skull: Normal. Negative for fracture or focal lesion. Sinuses/Orbits: Stable postsurgical changes bilateral globes. Otherwise the orbits and paranasal sinuses are unremarkable. Other: None. IMPRESSION: 1. Stable head CT, no acute intracranial process. Electronically Signed   By: Randa Ngo M.D.   On: 03/30/2022 16:14        Scheduled Meds:  aspirin EC  81 mg Oral Daily   atorvastatin  80 mg Oral Daily   clopidogrel  75 mg Oral Daily   gabapentin  300 mg Oral BID   insulin aspart  0-6 Units Subcutaneous Q4H   [START ON 04/03/2022] midodrine  10 mg Oral Q M,W,F-HD   pantoprazole  40 mg Oral Daily   sevelamer carbonate  1,600 mg Oral TID WC   sodium chloride flush  3 mL Intravenous Q12H   sodium chloride flush  3 mL Intravenous Q12H   sodium chloride flush  3 mL Intravenous Q12H   Continuous Infusions:  sodium chloride     sodium chloride     nitroGLYCERIN Stopped (03/31/22 1337)          Aline August, MD [facility] 04/01/2022, 11:59 AM

## 2022-04-01 NOTE — Progress Notes (Signed)
vLTM maintenance  all impedances below 10k  No skin breakdown noted at all skin sites.

## 2022-04-01 NOTE — Progress Notes (Signed)
Received patient in bed to unit.  Alert and oriented.  Informed consent signed and in chart.   Treatment initiated: 2210 Treatment completed: 0155  Patient tolerated well.  Transported back to the room  Alert, without acute distress.  Hand-off given to patient's nurse.   Access used: fistula Access issues: none  Total UF removed: 2600 Medication(s) given: none Post HD VS: 99.6 94/43 68 18  Post HD weight: 97.4kg   [facility]leigh[facility]

## 2022-04-02 ENCOUNTER — Inpatient Hospital Stay (HOSPITAL_COMMUNITY): Payer: PPO

## 2022-04-02 DIAGNOSIS — I200 Unstable angina: Secondary | ICD-10-CM | POA: Diagnosis not present

## 2022-04-02 DIAGNOSIS — I4891 Unspecified atrial fibrillation: Secondary | ICD-10-CM | POA: Diagnosis not present

## 2022-04-02 DIAGNOSIS — R569 Unspecified convulsions: Secondary | ICD-10-CM | POA: Diagnosis not present

## 2022-04-02 DIAGNOSIS — G40409 Other generalized epilepsy and epileptic syndromes, not intractable, without status epilepticus: Secondary | ICD-10-CM | POA: Diagnosis not present

## 2022-04-02 DIAGNOSIS — R251 Tremor, unspecified: Secondary | ICD-10-CM | POA: Diagnosis not present

## 2022-04-02 DIAGNOSIS — I5023 Acute on chronic systolic (congestive) heart failure: Secondary | ICD-10-CM | POA: Diagnosis not present

## 2022-04-02 DIAGNOSIS — I442 Atrioventricular block, complete: Secondary | ICD-10-CM | POA: Diagnosis not present

## 2022-04-02 LAB — GLUCOSE, CAPILLARY
Glucose-Capillary: 120 mg/dL — ABNORMAL HIGH (ref 70–99)
Glucose-Capillary: 122 mg/dL — ABNORMAL HIGH (ref 70–99)
Glucose-Capillary: 181 mg/dL — ABNORMAL HIGH (ref 70–99)
Glucose-Capillary: 88 mg/dL (ref 70–99)
Glucose-Capillary: 93 mg/dL (ref 70–99)

## 2022-04-02 LAB — HEPATITIS B SURFACE ANTIBODY, QUANTITATIVE: Hep B S AB Quant (Post): 14.3 m[IU]/mL (ref >9.9)

## 2022-04-02 MED ORDER — LIDOCAINE-PRILOCAINE 2.5-2.5 % EX CREA
1.0000 | TOPICAL_CREAM | CUTANEOUS | Status: DC | PRN
  Filled 2022-04-02: qty 5

## 2022-04-02 MED ORDER — ALTEPLASE 2 MG IJ SOLR
2.0000 mg | Freq: Once | INTRAMUSCULAR | Status: DC | PRN
  Filled 2022-04-02: qty 2

## 2022-04-02 MED ORDER — HEPARIN SODIUM (PORCINE) 1000 UNIT/ML DIALYSIS
3500.0000 [IU] | Freq: Once | INTRAMUSCULAR | Status: DC
  Filled 2022-04-02: qty 4

## 2022-04-02 MED ORDER — PENTAFLUOROPROP-TETRAFLUOROETH EX AERO: 1.0000 | INHALATION_SPRAY | CUTANEOUS | Status: DC | PRN

## 2022-04-02 MED ORDER — LIDOCAINE HCL (PF) 1 % IJ SOLN: 5.0000 mL | INTRAMUSCULAR | Status: DC | PRN

## 2022-04-02 MED ORDER — ANTICOAGULANT SODIUM CITRATE 4% (200MG/5ML) IV SOLN
5.0000 mL | Status: DC | PRN
  Filled 2022-04-02: qty 5

## 2022-04-02 MED ORDER — MIDODRINE HCL 10 MG PO TABS: 5.0000 mg | ORAL_TABLET | ORAL | Status: AC

## 2022-04-02 MED ORDER — HEPARIN SODIUM (PORCINE) 1000 UNIT/ML DIALYSIS
1000.0000 [IU] | INTRAMUSCULAR | Status: DC | PRN
  Filled 2022-04-02: qty 1

## 2022-04-02 MED ORDER — CLONAZEPAM 0.25 MG PO TBDP: 0.2500 mg | ORAL_TABLET | Freq: Two times a day (BID) | ORAL | 0 refills | Status: DC | PRN

## 2022-04-02 MED ORDER — CLONAZEPAM 0.25 MG PO TBDP: 0.2500 mg | ORAL_TABLET | Freq: Two times a day (BID) | ORAL | Status: DC | PRN

## 2022-04-02 MED ORDER — CLONAZEPAM 0.25 MG PO TBDP: 0.2500 mg | ORAL_TABLET | Freq: Two times a day (BID) | ORAL | 0 refills | Status: DC

## 2022-04-02 NOTE — Progress Notes (Signed)
EEG D/C'd. Patient had no skin breakdown. [facility] was notified.  

## 2022-04-02 NOTE — Progress Notes (Addendum)
[facility] Progress Note   Subjective: Seen in room. Up in chair. AV paced on monitor. Says he feels well. Excited about working with PT.   Objective Vitals:   04/01/22 1633 04/01/22 2014 04/02/22 0006 04/02/22 0403  BP: (!) 106/43 109/63 (!) 125/57 (!) 102/54  Pulse: 80 80 81 70  Resp: 18 18 18 18   Temp: 97.6 F (36.4 C) 98.2 F (36.8 C) 98.4 F (36.9 C) 98.2 F (36.8 C)  TempSrc: Oral Oral Oral Oral  SpO2: 94% 95% 94% 96%  Weight:    96.5 kg  Height:       Physical Exam General: Pleasant older male in NAD Heart: S1,S2 RRR AV pacing on monitor. 2/6 systolic M.  Lungs: CTAB A/P Abdomen: obese, NABS Extremities: No LE edema Neuro: No tremors at rest. Oriented X 3 Dialysis Access: R AVG+ T/B    Additional Objective Labs: Basic Metabolic Panel: Recent Labs  Lab 03/30/22 1438 03/31/22 0000 04/01/22 0411  NA 140 140 139  K 5.1 5.3* 4.6  CL 97* 100 95*  CO2 29 28 30   GLUCOSE 152* 151* 112*  BUN 34* 37* 22  CREATININE 8.66* 8.97* 6.22*  CALCIUM 9.5 9.0 9.5   Liver Function Tests: Recent Labs  Lab 03/30/22 1438 03/31/22 0000 04/01/22 0452  AST 23 18 17   ALT 25 22 21   ALKPHOS 77 67 79  BILITOT 0.7 0.8 0.9  PROT 6.9 5.9* 6.4*  ALBUMIN 3.8 3.3* 3.3*   No results for input(s): "LIPASE", "AMYLASE" in the last 168 hours. CBC: Recent Labs  Lab 03/30/22 1438 03/31/22 0000 04/01/22 0411  WBC 7.9 5.8 8.4  NEUTROABS 4.8  --   --   HGB 10.6* 9.3* 10.3*  HCT 32.1* 28.8* 30.5*  MCV 105.2* 109.1* 103.7*  PLT 126* 117* 125*   Blood Culture    Component Value Date/Time   SDES URINE, CLEAN CATCH 04/07/2019 1931   SPECREQUEST  04/07/2019 1931    NONE Performed at [facility], [place] [address], [place], [place] [address]    CULT >=100,000 COLONIES/mL PSEUDOMONAS AERUGINOSA (A) 04/07/2019 1931   REPTSTATUS 04/09/2019 FINAL 04/07/2019 1931    Cardiac Enzymes: No results for input(s): "CKTOTAL", "CKMB", "CKMBINDEX", "TROPONINI" in the  last 168 hours. CBG: Recent Labs  Lab 04/01/22 1147 04/01/22 1630 04/01/22 2024 04/02/22 0013 04/02/22 0411  GLUCAP 162* 105* 157* 122* 93   Iron Studies:  Recent Labs    03/30/22 2108  IRON 63  TIBC 238*  FERRITIN 1,330*   @lablastinr3 @ Studies/Results: CARDIAC CATHETERIZATION  Addendum Date: 04/01/2022     Ost RCA to Prox RCA lesion is 70% stenosed.   Prox Cx lesion is 30% stenosed.   Mid Cx to Dist Cx lesion is 40% stenosed.   Mid Cx lesion is 30% stenosed.   2nd Diag-1 lesion is 30% stenosed.   3rd Mrg lesion is 50% stenosed.   Non-stenotic 2nd Diag-2 lesion was previously treated.   Dist LM to Ost LAD lesion is 90% stenosed. In stent restenosis.   Scoring balloon angioplasty was performed using a BALLN WOLVERINE 3.50X10, followed by PTCA with 4.0 [place] balloon.   Post intervention, there is a 20% residual stenosis. Continue dual antiplatelet therapy.  I spoke with [name], his son, regarding the results of the procedure.  Same proximal LAD area continues with recurrent restenosis.  Cutting Balloon used on this occasion.  Perhaps of drug-coated balloon was available at some point, that would be an option. Plan is  for dialysis tomorrow, per the son.  Result Date: 04/01/2022   Ost RCA to Prox RCA lesion is 70% stenosed.   Prox Cx lesion is 30% stenosed.   Mid Cx to Dist Cx lesion is 40% stenosed.   Mid Cx lesion is 30% stenosed.   2nd Diag-1 lesion is 30% stenosed.   3rd Mrg lesion is 50% stenosed.   Non-stenotic 2nd Diag-2 lesion was previously treated.   Dist LM to Ost LAD lesion is 90% stenosed. In stent restenosis.   Scoring balloon angioplasty was performed using a BALLN WOLVERINE 3.50X10, followed by PTCA with 4.0 [place] balloon.   Post intervention, there is a 0% residual stenosis. Continue dual antiplatelet therapy.  I spoke with [name], his son, regarding the results of the procedure.  Same proximal LAD area continues with recurrent restenosis.  Cutting Balloon used on this occasion.  Perhaps  of drug-coated balloon was available at some point, that would be an option. Plan is for dialysis tomorrow, per the son.   Medications:  sodium chloride     sodium chloride     nitroGLYCERIN Stopped (03/31/22 1337)    aspirin EC  81 mg Oral Daily   atorvastatin  80 mg Oral Daily   clonazePAM  0.25 mg Oral BID   clopidogrel  75 mg Oral Daily   insulin aspart  0-6 Units Subcutaneous Q4H   [START ON 04/03/2022] midodrine  10 mg Oral Q M,W,F-HD   pantoprazole  40 mg Oral Daily   sevelamer carbonate  1,600 mg Oral TID WC   sodium chloride flush  3 mL Intravenous Q12H   sodium chloride flush  3 mL Intravenous Q12H   sodium chloride flush  3 mL Intravenous Q12H     HD orders: NW MWF 4 hrs 180NRe 400/500 97 kg 2.0K/2.0 Ca UFP 2 AVF  -Heparin 2500 units IV initial bolus 1000 units IV mid run -Mircera 75 mcg IV q 4 weeks (last dose 03/16/2022) -Venofer 50 mg IV q week -Calcitriol 1.5 mcg PO TIW    Assessment/Plan: 1.  [place]/CAD-S/P PTA to proximal LAD 03/31/2022.  Will require DAPT 1 year. Per Cardiology.  2. ICM-Last EF 45-50%. Optimize volume on HD.  3. ESRD -MWF. Completed HD early this AM. Next HD 04/03/2022.  4. Anemia - HGB 10.3. Follow HGB.  5. Secondary hyperparathyroidism - continue sevelamer binders, VDRA. Add RFP to today's labs.  6. HTN/volume - Chronic hypotension on midodrine. Continue midodrine-may repeat dose mid run if needed. Slightly under EDW but BP already low. Will not lower on DC,  7. Nutrition - Checking albumin today. Renal/Carb mod diet. Add nepro for now.  8. DMT2-per primary 9. Tremor-W/U per neurology/primary. Plan to start clonazepam 0.25 mg twice daily. Would be cautious using clonazepam in 88 year old ESRD patient.   Deylan Canterbury H. Eunice Oldaker NP-C 04/02/2022, 10:35 AM  [employer] [phone]

## 2022-04-02 NOTE — Procedures (Signed)
Patient Name: Rodney Farley  MRN: [id number]  Epilepsy Attending: Lora Havens  Referring Physician/Provider: Amie Portland, MD  Duration: 04/01/2022 2897 to 04/02/2022 0325   Patient history: 88yo M with seizure like activity. EEG to evaluate for seizure   Level of alertness: Awake, asleep   AEDs during EEG study: GBP, Clonazepam   Technical aspects: This EEG study was done with scalp electrodes positioned according to the 10-20 International system of electrode placement. Electrical activity was reviewed with band pass filter of 1-70Hz , sensitivity of 7 uV/mm, display speed of 29mm/sec with a 60Hz  notched filter applied as appropriate. EEG data were recorded continuously and digitally stored.  Video monitoring was available and reviewed as appropriate.   Description: The posterior dominant rhythm consists of 9 Hz activity of moderate voltage (25-35 uV) seen predominantly in posterior head regions, symmetric and reactive to eye opening and eye closing. Sleep was characterized by vertex waves, sleep spindles (12-14hz ), maximal fronto-central region. Hyperventilation and photic stimulation were not performed.      IMPRESSION: This study is within normal limits. No seizures or epileptiform discharges were seen throughout the recording.   Adamari Frede Barbra Sarks

## 2022-04-02 NOTE — Discharge Summary (Addendum)
Physician Discharge Summary  Rodney Farley PIR:[id number] DOB: May 16, 1932 DOA: 03/30/2022  PCP: [name], MD  Admit date: 03/30/2022 Discharge date: 04/02/2022  Admitted From: Home Disposition: Home.  Refused SNF placement.  Recommendations for Outpatient Follow-up:  Follow up with PCP in 1 week  Outpatient follow-up with cardiology, neurology and nephrology Recommend outpatient follow-up with palliative care  follow up in ED if symptoms worsen or new appear   Home Health: Home health PT equipment/Devices: None  Discharge Condition: Guarded  CODE STATUS: Partial: ok for intubation but no CPR Diet recommendation: Heart healthy/carb modified/renal hemodialysis diet   Brief/Interim Summary: 88 y.o. M with hx ESRD on HD, COPD, chronic systolic CHF, CHB s/p PPM, DM type II, HTN, OSA and obesity presented with worsening shortness of breath and chest pain.  He also was found to have new episodes of whole body shaking.  Cardiology, neurology and nephrology were consulted.  He underwent cardiac catheterization.  Postprocedure he has remained chest pain-free.  Cardiology has signed off.  Neurology was consulted for tremors/?  Seizures: EEG and LTM EEG were negative for seizures.  Neurology recommended as needed Klonopin for seizure and outpatient follow-up with neurology.  Patient tolerated inpatient hemodialysis.  Will need outpatient follow-up with nephrology and dialysis as scheduled.  PT recommended SNF placement.  Patient/family refused SNF placement.  He will be discharged home today with home health PT.  Discharge Diagnoses:  Unstable angina/mildly elevated troponin CAD Hyperlipidemia -High-sensitivity troponins 54, then 57 and 59.  Status post cardiac catheterization and balloon angioplasty to in-stent restenosis of proximal LAD.  Continue aspirin, Plavix, statin.  Cardiology recommended dual antiplatelet therapy with aspirin/Plavix for minimum of 1 year and signed  off. -Currently chest pain-free.  Outpatient follow-up with cardiology.   Tremors -EEG and LTM EEG were negative for seizures.  Neurology recommended as needed Klonopin for seizure and outpatient follow-up with neurology.   Chronic chronic systolic heart failure/ischemic cardiomyopathy -Volume management as per nephrology by dialysis   End-stage renal disease on hemodialysis -Nephrology following.  Dialysis as per nephrology schedule.  Outpatient follow-up with nephrology and dialysis unit as scheduled.   Chronic hypotension -Continue midodrine   Hyperkalemia -Improved   Anemia of chronic disease/iron deficiency anemia -From renal failure.  Hemoglobin stable.  Monitor intermittently as an outpatient   Thrombocytopenia -No signs of bleeding.  Monitor intermittently as an outpatient   Moderate aortic stenosis -Cardiology has signed off and recommended outpatient follow-up   History of complete heart block status post ICD placement -Outpatient follow-up with EP   Subclinical atrial fibrillation -Noted on prior device check in 2021.  Not on anticoagulation due to low atrial fibrillation burden as per cardiology. -Currently rate controlled.   Generalized deconditioning -PT recommended SNF placement.  Patient refused SNF placement.  Will need home health PT.    Goals of care -Palliative care consulted for goals of care discussion.  CODE STATUS has been changed to partial.  Recommend outpatient palliative care follow-up   obesity -Outpatient follow-up   Discharge Instructions   Allergies as of 04/02/2022       Reactions   Bee Venom Anaphylaxis   Lyrica [pregabalin] Other (See Comments)   hallucinations   Prednisone Other (See Comments)   hallucinations   Zocor [simvastatin] Nausea Only, Other (See Comments)   Headache with brand name only.  Can take the generic.        Medication List     TAKE these medications    aspirin EC  81 MG tablet Take 81 mg by mouth  daily.   atorvastatin 80 MG tablet Commonly known as: LIPITOR Take 1 tablet by mouth daily.   calcitRIOL 0.25 MCG capsule Commonly known as: ROCALTROL Take 7 capsules (1.75 mcg total) by mouth every Monday, Wednesday, and Friday with hemodialysis.   carbamide peroxide 6.5 % OTIC solution Commonly known as: DEBROX Place 5 drops into both ears 2 (two) times daily as needed (earwax).   clonazePAM 0.25 MG disintegrating tablet Commonly known as: KLONOPIN Take 1 tablet (0.25 mg total) by mouth 2 (two) times daily as needed for seizure.   clopidogrel 75 MG tablet Commonly known as: PLAVIX Take 1 tablet (75 mg total) by mouth daily with breakfast.   cyanocobalamin 1000 MCG tablet Commonly known as: VITAMIN B12 Take 1,000 mcg by mouth daily.   FREESTYLE TEST STRIPS test strip Generic drug: glucose blood Use to check blood sugar daily   gabapentin 300 MG capsule Commonly known as: NEURONTIN Take 1 capsule (300 mg total) by mouth 2 (two) times daily.   glucose monitoring kit monitoring kit USE TO MONITOR BLOOD GLUCOSE AS DIRECTED   LUBRICATING EYE DROPS OP Place 1 drop into both eyes 3 (three) times daily as needed (dry eyes).   midodrine 10 MG tablet Commonly known as: PROAMATINE Take 0.5-1 tablets (5-10 mg total) by mouth See admin instructions. Take 1 tablet (10 mg) three times a week. Add 5 mg a night after dialysis as needed for low blood pressure.   MIRCERA IJ as directed.   multivitamin Tabs tablet Take 1 tablet by mouth daily.   nitroGLYCERIN 0.4 MG SL tablet Commonly known as: NITROSTAT Place 1 tablet under the tongue every 5 minutes x 3 doses as needed for chest pain.   oxymetazoline 0.05 % nasal spray Commonly known as: AFRIN Place 1 spray into both nostrils 2 (two) times daily as needed for congestion.   pantoprazole 40 MG tablet Commonly known as: PROTONIX Take 1 tablet by mouth daily.   saccharomyces boulardii 250 MG capsule Commonly known as:  FLORASTOR Take 1 capsule (250 mg total) by mouth 2 (two) times daily.   sevelamer carbonate 800 MG tablet Commonly known as: RENVELA Take 2 tablets (1,600 mg total) by mouth 3 (three) times daily with meals. What changed:  when to take this additional instructions            Follow-up Information     [name], MD Follow up in 1 week(s).   Specialty: Family Medicine Contact information: [place] [address] [phone]         [name], Tyse M, MD .   Specialty: Cardiology Contact information: [address] [place] [address] [phone]                Allergies  Allergen Reactions   Bee Venom Anaphylaxis   Lyrica [Pregabalin] Other (See Comments)    hallucinations   Prednisone Other (See Comments)    hallucinations   Zocor [Simvastatin] Nausea Only and Other (See Comments)    Headache with brand name only.  Can take the generic.    Consultations: Cardiology/nephrology/neurology/palliative care   Procedures/Studies: CARDIAC CATHETERIZATION  Addendum Date: 04/01/2022     Ost RCA to Prox RCA lesion is 70% stenosed.   Prox Cx lesion is 30% stenosed.   Mid Cx to Dist Cx lesion is 40% stenosed.   Mid Cx lesion is 30% stenosed.   2nd Diag-1 lesion is 30%  stenosed.   3rd Mrg lesion is 50% stenosed.   Non-stenotic 2nd Diag-2 lesion was previously treated.   Dist LM to Ost LAD lesion is 90% stenosed. In stent restenosis.   Scoring balloon angioplasty was performed using a BALLN WOLVERINE 3.50X10, followed by PTCA with 4.0 [place] balloon.   Post intervention, there is a 20% residual stenosis. Continue dual antiplatelet therapy.  I spoke with [name], his son, regarding the results of the procedure.  Same proximal LAD area continues with recurrent restenosis.  Cutting Balloon used on this occasion.  Perhaps of drug-coated balloon was available at some point, that would be an option. Plan is for dialysis tomorrow, per the  son.  Result Date: 04/01/2022   Ost RCA to Prox RCA lesion is 70% stenosed.   Prox Cx lesion is 30% stenosed.   Mid Cx to Dist Cx lesion is 40% stenosed.   Mid Cx lesion is 30% stenosed.   2nd Diag-1 lesion is 30% stenosed.   3rd Mrg lesion is 50% stenosed.   Non-stenotic 2nd Diag-2 lesion was previously treated.   Dist LM to Ost LAD lesion is 90% stenosed. In stent restenosis.   Scoring balloon angioplasty was performed using a BALLN WOLVERINE 3.50X10, followed by PTCA with 4.0 [place] balloon.   Post intervention, there is a 0% residual stenosis. Continue dual antiplatelet therapy.  I spoke with [name], his son, regarding the results of the procedure.  Same proximal LAD area continues with recurrent restenosis.  Cutting Balloon used on this occasion.  Perhaps of drug-coated balloon was available at some point, that would be an option. Plan is for dialysis tomorrow, per the son.   Overnight EEG with video  Result Date: 03/31/2022 Lora Havens, MD     04/01/2022  9:00 AM Patient Name: Rodney Farley MRN: [id number] Epilepsy Attending: Lora Havens Referring Physician/Provider: Amie Portland, MD Duration: 03/31/2022 0325 to 04/01/2022 0325  Patient history: 88yo M with seizure like activity. EEG to evaluate for seizure  Level of alertness: Awake, asleep  AEDs during EEG study: None  Technical aspects: This EEG study was done with scalp electrodes positioned according to the 10-20 International system of electrode placement. Electrical activity was reviewed with band pass filter of 1-70Hz, sensitivity of 7 uV/mm, display speed of 27m/sec with a 60Hz notched filter applied as appropriate. EEG data were recorded continuously and digitally stored.  Video monitoring was available and reviewed as appropriate.  Description: The posterior dominant rhythm consists of 9 Hz activity of moderate voltage (25-35 uV) seen predominantly in posterior head regions, symmetric and reactive to eye opening and eye closing.  Sleep was characterized by vertex waves, sleep spindles (12-14hz), maximal fronto-central region. Hyperventilation and photic stimulation were not performed.    IMPRESSION: This study is within normal limits. No seizures or epileptiform discharges were seen throughout the recording.  PLora Havens   EEG adult  Result Date: 03/30/2022 YLora Havens MD     03/30/2022  6:58 PM Patient Name: Rodney MckinnonMRN: [id number] Attending: PLora HavensReferring Physician/Provider: LKerney Elbe MD Date: 03/30/2022 Duration: 23.33 mins Patient history: 89yoM with seizure like activity. EEG to evaluate for seizure Level of alertness: Awake AEDs during EEG study: None Technical aspects: This EEG study was done with scalp electrodes positioned according to the 10-20 International system of electrode placement. Electrical activity was reviewed with band pass filter of 1-70Hz, sensitivity of 7 uV/mm, display speed of 356msec with a 60Hz  notched filter applied as appropriate. EEG data were recorded continuously and digitally stored.  Video monitoring was available and reviewed as appropriate. Description: The posterior dominant rhythm consists of 9 Hz activity of moderate voltage (25-35 uV) seen predominantly in posterior head regions, symmetric and reactive to eye opening and eye closing. Patient was noted to have episodes of bilateral lower extremity twitching. Concomitant EEG before, during and after the event showed normal posterior dominant rhythm.  Hyperventilation and photic stimulation were not performed.   IMPRESSION: This study is within normal limits. No seizures or epileptiform discharges were seen throughout the recording. Patient was noted to have episodes of bilateral lower extremity twitching without concomitant EEG change. These episodes were most likely NOT epileptic Lora Havens   DG Chest Port 1 View  Result Date: 03/30/2022 CLINICAL DATA:  Chest pain during hemodialysis.  EXAM: PORTABLE CHEST 1 VIEW COMPARISON:  Radiographs 02/08/2022 and 01/21/2022.  CT 02/03/2022. FINDINGS: 1624 hours. Multiple left subclavian AICD leads are grossly stable. The heart is enlarged. There is aortic atherosclerosis and diffuse pulmonary interstitial prominence, similar to the most recent study. No confluent airspace opacity, pneumothorax or significant pleural effusion. The bones appear unremarkable. Several telemetry leads overlie the chest. IMPRESSION: Stable cardiomegaly and diffuse interstitial pulmonary prominence which may reflect mild edema. Electronically Signed   By: Richardean Sale M.D.   On: 03/30/2022 16:43   CT Head Wo Contrast  Result Date: 03/30/2022 CLINICAL DATA:  New onset seizure, altered level of consciousness EXAM: CT HEAD WITHOUT CONTRAST TECHNIQUE: Contiguous axial images were obtained from the base of the skull through the vertex without intravenous contrast. RADIATION DOSE REDUCTION: This exam was performed according to the departmental dose-optimization program which includes automated exposure control, adjustment of the mA and/or kV according to patient size and/or use of iterative reconstruction technique. COMPARISON:  04/01/2019 FINDINGS: Brain: Stable hypodensities throughout the periventricular white matter and basal ganglia, consistent with chronic small vessel ischemic change. No signs of acute infarct or hemorrhage. The lateral ventricles and remaining midline structures are unremarkable. No acute extra-axial fluid collections. No mass effect. Vascular: Stable atherosclerosis.  No hyperdense vessel. Skull: Normal. Negative for fracture or focal lesion. Sinuses/Orbits: Stable postsurgical changes bilateral globes. Otherwise the orbits and paranasal sinuses are unremarkable. Other: None. IMPRESSION: 1. Stable head CT, no acute intracranial process. Electronically Signed   By: Randa Ngo M.D.   On: 03/30/2022 16:14      Subjective: Patient seen and examined  at bedside.  Sleepy, wakes up slightly, answers some questions.  Does not want to go to SNF and wants to go to home.  No overnight fever, seizures, agitation reported  Discharge Exam: Vitals:   04/02/22 0403 04/02/22 1053  BP: (!) 102/54 131/63  Pulse: 70 90  Resp: 18 18  Temp: 98.2 F (36.8 C) 98 F (36.7 C)  SpO2: 96% 96%    General: Pt is alert, awake, not in acute distress.  Elderly male lying in bed.  Currently on room air.  Slow to respond but answers some questions appropriately Cardiovascular: rate controlled, S1/S2 + Respiratory: bilateral decreased breath sounds at bases with some scattered crackles Abdominal: Soft, obese, NT, ND, bowel sounds + Extremities: Trace lower extremity edema; no cyanosis    The results of significant diagnostics from this hospitalization (including imaging, microbiology, ancillary and laboratory) are listed below for reference.     Microbiology: No results found for this or any previous visit (from the past 240 hour(s)).   Labs:  BNP (last 3 results) No results for input(s): "BNP" in the last 8760 hours. Basic Metabolic Panel: Recent Labs  Lab 03/30/22 1438 03/31/22 0000 04/01/22 0411  NA 140 140 139  K 5.1 5.3* 4.6  CL 97* 100 95*  CO2 _0 GLUCOSE 152* 151* 112*  BUN 34* 37* 22  CREATININE 8.66* 8.97* 6.22*  CALCIUM 9.5 9.0 9.5  MG 2.0  --   --    Liver Function Tests: Recent Labs  Lab 03/30/22 1438 03/31/22 0000 04/01/22 0452  AST _1 ALT _2 ALKPHOS 77 67 79  BILITOT 0.7 0.8 0.9  PROT 6.9 5.9* 6.4*  ALBUMIN 3.8 3.3* 3.3*   No results for input(s): "LIPASE", "AMYLASE" in the last 168 hours. No results for input(s): "AMMONIA" in the last 168 hours. CBC: Recent Labs  Lab 03/30/22 1438 03/31/22 0000 04/01/22 0411  WBC 7.9 5.8 8.4  NEUTROABS 4.8  --   --   HGB 10.6* 9.3* 10.3*  HCT 32.1* 28.8* 30.5*  MCV 105.2* 109.1* 103.7*  PLT 126* 117* 125*   Cardiac Enzymes: No results for input(s):  "CKTOTAL", "CKMB", "CKMBINDEX", "TROPONINI" in the last 168 hours. BNP: Invalid input(s): "POCBNP" CBG: Recent Labs  Lab 04/01/22 2024 04/02/22 0013 04/02/22 0411 04/02/22 0816 04/02/22 1206  GLUCAP 157* 122* 93 88 181*   D-Dimer No results for input(s): "DDIMER" in the last 72 hours. Hgb A1c No results for input(s): "HGBA1C" in the last 72 hours. Lipid Profile No results for input(s): "CHOL", "HDL", "LDLCALC", "TRIG", "CHOLHDL", "LDLDIRECT" in the last 72 hours. Thyroid function studies No results for input(s): "TSH", "T4TOTAL", "T3FREE", "THYROIDAB" in the last 72 hours.  Invalid input(s): "FREET3" Anemia work up Recent Labs    03/30/22 2108  VITAMINB12 1,446*  FOLATE 25.4  FERRITIN 1,330*  TIBC 238*  IRON 63  RETICCTPCT 1.9   Urinalysis    Component Value Date/Time   COLORURINE YELLOW 04/07/2019 2001   APPEARANCEUR CLOUDY (A) 04/07/2019 2001   LABSPEC 1.015 04/07/2019 2001   PHURINE 5.0 04/07/2019 2001   GLUCOSEU 50 (A) 04/07/2019 2001   GLUCOSEU NEGATIVE 03/25/2017 1309   HGBUR MODERATE (A) 04/07/2019 2001   BILIRUBINUR NEGATIVE 04/07/2019 2001   BILIRUBINUR negative 04/04/2015 1229   BILIRUBINUR neg 02/27/2015 1359   KETONESUR NEGATIVE 04/07/2019 2001   PROTEINUR >=300 (A) 04/07/2019 2001   UROBILINOGEN 0.2 03/25/2017 1309   NITRITE NEGATIVE 04/07/2019 2001   LEUKOCYTESUR LARGE (A) 04/07/2019 2001   Sepsis Labs Recent Labs  Lab 03/30/22 1438 03/31/22 0000 04/01/22 0411  WBC 7.9 5.8 8.4   Microbiology No results found for this or any previous visit (from the past 240 hour(s)).   Time coordinating discharge: 35 minutes  SIGNED:   Aline August, MD  [facility] 04/02/2022, 12:50 PM

## 2022-04-02 NOTE — Progress Notes (Signed)
D/C order noted. Contacted [facility] to advise clinic of pt's d/c today and that pt should resume care tomorrow.   Melven Sartorius Renal Navigator [phone]

## 2022-04-02 NOTE — Progress Notes (Signed)
Subjective: No acute events overnight.  No new concerns.  ROS: negative except above  Examination  Vital signs in last 24 hours: Temp:  [97.6 F (36.4 C)-98.4 F (36.9 C)] 98 F (36.7 C) (09/21 1053) Pulse Rate:  [70-90] 90 (09/21 1053) Resp:  [18] 18 (09/21 1053) BP: (102-131)/(43-63) 131/63 (09/21 1053) SpO2:  [94 %-96 %] 96 % (09/21 1053) Weight:  [96.5 kg] 96.5 kg (09/21 0403)    General: lying in bed, NAD Neuro: MS: Alert, oriented, follows commands CN: pupils equal and reactive,  EOMI, face symmetric, tongue midline, normal sensation over face, Motor: 5/5 strength in all 4 extremities Coordination: normal Gait: not tested    Basic Metabolic Panel: Recent Labs  Lab 03/30/22 1438 03/31/22 0000 04/01/22 0411  NA 140 140 139  K 5.1 5.3* 4.6  CL 97* 100 95*  CO2 29 28 30   GLUCOSE 152* 151* 112*  BUN 34* 37* 22  CREATININE 8.66* 8.97* 6.22*  CALCIUM 9.5 9.0 9.5  MG 2.0  --   --     CBC: Recent Labs  Lab 03/30/22 1438 03/31/22 0000 04/01/22 0411  WBC 7.9 5.8 8.4  NEUTROABS 4.8  --   --   HGB 10.6* 9.3* 10.3*  HCT 32.1* 28.8* 30.5*  MCV 105.2* 109.1* 103.7*  PLT 126* 117* 125*     Coagulation Studies: No results for input(s): "LABPROT", "INR" in the last 72 hours.  Imaging No new brain imaging   ASSESSMENT AND PLAN: 88 year old male with history of left-sided Bell's palsy with mild residual left facial weakness, severe COPD, coronary artery disease, complete heart block status post pacemaker, diabetes, diabetic polyneuropathy, hypertension, hyperlipidemia, end-stage renal disease on hemodialysis MWF who presented with chest pain and is now status post angioplasty.  While in the hospital patient was also noted to have episodes of arm and leg shaking happening up to 6 times every day and he is on video EEG for characterization of these spells.   Myoclonus -No evidence of epileptiform abnormality on EEG so far.  The semiology of the episodes is most  typical of myoclonus.   Recommendations -DC LTM EEG -Considered switching gabapentin to pregabalin.  However patient has allergy documented to pregabalin (led to hallucinations).  Therefore we will start clonazepam 0.25 mg twice daily as needed for jerking lasting over 2 minutes -Unable to obtain MRI as patient has gold implant in his left eye.  Therefore will obtain CT C-spine without contrast due to ESRD -Also recommend follow-up with movement disorder doctor at [facility] in 2 to 3 months -Patient episodes do not interfere with his driving and have never happened during driving.  As I do not suspect seizures, I am not putting driving restrictions on him. However I did discuss and recommend outpatient driving evaluation.   I have spent a total of  35 minutes with the patient reviewing hospital notes,  test results, labs and examining the patient as well as establishing an assessment and plan that was discussed personally with the patient.  > 50% of time was spent in direct patient care.   [facility] [employer] Epilepsy [facility] For questions after 5pm please refer to AMION to reach the Neurologist on call

## 2022-04-02 NOTE — TOC Initial Note (Signed)
Transition of Care [facility]) - Initial/Assessment Note    Patient Details  Name: Rodney Farley MRN: [id number] Date of Birth: August 25, 1931  Transition [facility]) CM/SW Contact:    Bethena Roys, RN Phone Number: 04/02/2022, 3:44 PM  Clinical Narrative: Risk for readmission assessment completed. PTA patient was from home alone in an apartment. Patient has DME rolling walker, rollator,  and tub bench. Case Manager spoke with both patient and son and the patient wants to return home with home health services. Son did not have a preference for agency. Case Manager discussed in network agencies and the family is agreeable to [place]. Referral made to [facility] and the agency can accept the patient. Start of care to begin within 24-48 hours post transition home. Patient will need PT/OT orders and F2F. No DME needs identified at this time. Case Manager will continue to follow for additional transition of care needs.               Expected Discharge Plan: [place] Barriers to Discharge: Continued Medical Work up   Patient Goals and CMS Choice Patient states their goals for this hospitalization and ongoing recovery are:: to return home   Choice offered to / list presented to : Adult Children (son did not have a preference for agency)  Expected Discharge Plan and Services Expected Discharge Plan: [place] In-house Referral: Clinical Social Work Discharge Planning Services: CM Consult Post Acute Care Choice: [place] arrangements for the past 2 months: Single Family Home                 DME Arranged: N/A DME Agency: NA       HH Arranged: PT, OT HH Agency: [place] Date [place]: 04/02/22 Time Turner: 47 Representative spoke with at [place]: [place]  Prior Living Arrangements/Services Living arrangements for the past 2 months: [place] with::  Self Patient language and need for interpreter reviewed:: Yes Do you feel safe going back to the place where you live?: Yes      Need for Family Participation in Patient Care: Yes (Comment) Care giver support system in place?: Yes (comment) Current home services: DME (rolling walker, rollator, tub bench,) Criminal Activity/Legal Involvement Pertinent to Current Situation/Hospitalization: No - Comment as needed  Permission Sought/Granted Permission sought to share information with : Case Manager, Customer service manager, Family Supports Permission granted to share information with : Yes, Verbal Permission Granted  Share Information with NAME: Samer, [place] (Son)   [phone] (Mobile)  Permission granted to share info w AGENCY: [facility]        Emotional Assessment Appearance:: Appears stated age Attitude/Demeanor/Rapport: Engaged Affect (typically observed): Appropriate Orientation: : Oriented to Situation, Oriented to  Time, Oriented to Self, Oriented to Place Alcohol / Substance Use: Not Applicable Psych Involvement: No (comment)  Admission diagnosis:  Unstable angina (HCC) [I20.0] Tonic clonic convulsion ([place]) [G40.409] Chest pain with high risk for cardiac etiology [R07.9] Patient Active Problem List   Diagnosis Date Noted   Unstable angina ([place]) 03/30/2022   Tremor 03/30/2022   Acute on chronic systolic CHF (congestive heart failure) ([place]) 01/12/2022   Aortic stenosis 12/24/2021   Hypotension 12/24/2021   Secondary hypoparathyroidism ([place]) 12/24/2021   NSTEMI (non-ST elevated myocardial infarction) ([place]) 12/23/2021   History of TIA (transient ischemic attack)    History of non-ST elevation myocardial infarction (NSTEMI) 11/11/2021   Atrial fibrillation (  [place]) - SCAF 08/26/2021   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 07/29/2020   Dependence on renal dialysis ([place]) 11/13/2019   Coagulation defect, unspecified ([place]) 05/11/2019    Encounter for immunization 04/25/2019   Thrombocytopenia ([place]) 04/13/2019   Hypokalemia 04/12/2019   Anaphylactic shock, unspecified, initial encounter 04/10/2019   Anemia in chronic kidney disease 64/33/2951   Complication of vascular dialysis catheter 04/10/2019   Iron deficiency anemia, unspecified 04/10/2019   Secondary hyperparathyroidism of renal origin ([place]) 04/10/2019   Hyperkalemia, diminished renal excretion 04/01/2019   Hyperlipidemia 04/13/2018   Non-ST elevation (NSTEMI) myocardial infarction ([place]) 10/15/2017   S/P cholecystectomy 10/13/2017   Diarrhea 05/11/2017   Marital stress 02/18/2017   BPH associated with nocturia 04/27/2016   Diabetic peripheral neuropathy (HCC)    COPD (chronic obstructive pulmonary disease) ([place])    Bell's palsy    Gout 06/26/2015   Diverticulitis 04/08/2015   DOE (dyspnea on exertion), due to significant CAD 03/28/2015   ESRD on hemodialysis ([place]) 03/04/2015   ESRD on dialysis ([place]) 03/04/2015   Coronary artery disease 09/13/2014   GERD (gastroesophageal reflux disease) 08/07/2012   Major depression, recurrent, full remission ([place]) 08/05/2012   Sleep apnea 08/05/2012   Type II diabetes mellitus ([place]) 08/05/2012   Hypertension 08/05/2012   Depression 08/05/2012   Diabetes type 2, uncontrolled 08/05/2012   Ischemic cardiomyopathy 11/03/2011   Atrioventricular block, complete (HCC)    Obesity (BMI 30-39.9)    Pacemaker    PCP:  [name], MD Pharmacy:   [facility], [address] [place] [address] Phone: [phone] Fax: [facility] [address] [place] [address] Phone: [phone] Fax: [phone]  Readmission Risk Interventions    04/02/2022    3:35 PM  Readmission Risk Prevention Plan  Transportation Screening Complete  Medication Review ([place]) Complete  PCP or Specialist  appointment within 3-5 days of discharge Complete  HRI or [place] Complete  SW Recovery Care/Counseling Consult Complete  [place] Not Applicable

## 2022-04-02 NOTE — Progress Notes (Signed)
Physical Therapy Treatment Patient Details Name: Rodney Farley MRN: [id number] DOB: 1931/11/06 Today's Date: 04/02/2022   History of Present Illness Pt is an 88 y/o male presenting with chest pain, SOB and tremors. Initial EEG negative with further overnight EEG indicated. Admitted for further work up and possible heart cath. PMH: ESRD on HD MWF, AICD, Bell's Palsy, BPH, COPD, CAD s/p MI, GERD, DM, neuropathy, gout, HTN, shingles, pancreatitis, TIA    PT Comments    Pt now off of continuous EEG and able to mobilize more freely. Pt mobilizing close to baseline and can return home from PT standpoint when medically ready.    Recommendations for follow up therapy are one component of a multi-disciplinary discharge planning process, led by the attending physician.  Recommendations may be updated based on patient status, additional functional criteria and insurance authorization.  Follow Up Recommendations  Home health PT Can patient physically be transported by private vehicle: Yes   Assistance Recommended at Discharge Intermittent Supervision/Assistance  Patient can return home with the following Help with stairs or ramp for entrance;Assist for transportation   Equipment Recommendations  None recommended by PT    Recommendations for Other Services       Precautions / Restrictions Precautions Precautions: Fall Restrictions Weight Bearing Restrictions: No     Mobility  Bed Mobility               General bed mobility comments: Pt up in chair    Transfers Overall transfer level: Modified independent Equipment used: Rolling walker (2 wheels), None Transfers: Sit to/from Stand Sit to Stand: Modified independent (Device/Increase time)                Ambulation/Gait Ambulation/Gait assistance: Modified independent (Device/Increase time), Supervision Gait Distance (Feet): 300 Feet Assistive device: Rolling walker (2 wheels), None Gait Pattern/deviations:  Step-through pattern, Decreased stride length Gait velocity: adequate Gait velocity interpretation: 1.31 - 2.62 ft/sec, indicative of limited community ambulator   General Gait Details: Pt amb in room using furniture or walker for UE support. Modified independent in room. Supervision in hall for safety.   Stairs             Wheelchair Mobility    Modified Rankin (Stroke Patients Only)       Balance Overall balance assessment: Needs assistance Sitting-balance support: Feet supported Sitting balance-Leahy Scale: Good     Standing balance support: No upper extremity supported Standing balance-Leahy Scale: Fair                              Cognition Arousal/Alertness: Awake/alert Behavior During Therapy: WFL for tasks assessed/performed Overall Cognitive Status: Within Functional Limits for tasks assessed                                          Exercises      General Comments General comments (skin integrity, edema, etc.): VSS on RA      Pertinent Vitals/Pain Pain Assessment Pain Assessment: No/denies pain    Home Living                          Prior Function            PT Goals (current goals can now be found in the care plan section) Acute Rehab PT Goals PT  Goal Formulation: With patient Time For Goal Achievement: 04/16/22 Potential to Achieve Goals: Good Progress towards PT goals: Goals met and updated - see care plan    Frequency    Min 2X/week      PT Plan Discharge plan needs to be updated    Co-evaluation              AM-PAC PT "6 Clicks" Mobility   Outcome Measure  Help needed turning from your back to your side while in a flat bed without using bedrails?: None Help needed moving from lying on your back to sitting on the side of a flat bed without using bedrails?: None Help needed moving to and from a bed to a chair (including a wheelchair)?: A Little Help needed standing up from a  chair using your arms (e.g., wheelchair or bedside chair)?: None Help needed to walk in hospital room?: A Little Help needed climbing 3-5 steps with a railing? : A Little 6 Click Score: 21    End of Session Equipment Utilized During Treatment: Gait belt Activity Tolerance: Patient tolerated treatment well Patient left: in chair;with call bell/phone within reach Nurse Communication: Mobility status PT Visit Diagnosis: Other abnormalities of gait and mobility (R26.89)     Time: 7169-6789 PT Time Calculation (min) (ACUTE ONLY): 15 min  Charges:  $Gait Training: 8-22 mins                     [place] 04/02/2022, 1:34 PM

## 2022-04-02 NOTE — Progress Notes (Signed)
[facility]) Hospital Liaison note:  Notified by Dr. Aline August of request for [place] services. Will continue to follow for disposition.  Please call with any outpatient palliative questions or concerns.  Thank you for the opportunity to participate in this patient's care.  Thank you, Lorelee Market, LPN [facility] Liaison [phone]

## 2022-04-02 NOTE — Procedures (Signed)
Patient Name: Trendon Zaring  MRN: [id number]  Epilepsy Attending: Lora Havens  Referring Physician/Provider: Amie Portland, MD  Duration: 04/02/2022 0325 to 04/02/2022 7124   Patient history: 88yo M with seizure like activity. EEG to evaluate for seizure   Level of alertness: Awake, asleep   AEDs during EEG study: GBP, Clonazepam   Technical aspects: This EEG study was done with scalp electrodes positioned according to the 10-20 International system of electrode placement. Electrical activity was reviewed with band pass filter of 1-70Hz , sensitivity of 7 uV/mm, display speed of 1mm/sec with a 60Hz  notched filter applied as appropriate. EEG data were recorded continuously and digitally stored.  Video monitoring was available and reviewed as appropriate.   Description: The posterior dominant rhythm consists of 9 Hz activity of moderate voltage (25-35 uV) seen predominantly in posterior head regions, symmetric and reactive to eye opening and eye closing. Sleep was characterized by vertex waves, sleep spindles (12-14hz ), maximal fronto-central region. Hyperventilation and photic stimulation were not performed.      IMPRESSION: This study is within normal limits. No seizures or epileptiform discharges were seen throughout the recording.   Chrisanne Loose Barbra Sarks

## 2022-04-06 ENCOUNTER — Telehealth: Payer: Self-pay

## 2022-04-06 ENCOUNTER — Ambulatory Visit (HOSPITAL_COMMUNITY): Payer: PPO

## 2022-04-06 ENCOUNTER — Encounter: Payer: Self-pay | Admitting: *Deleted

## 2022-04-06 NOTE — Telephone Encounter (Signed)
Attempted to contact patient's son Paul/DIL [name] to schedule a Palliative Care consult appointment. No answer left a message to return call.

## 2022-04-07 ENCOUNTER — Telehealth: Payer: Self-pay | Admitting: Family Medicine

## 2022-04-07 NOTE — Telephone Encounter (Signed)
Called and lm on Kathrines vm with VO.

## 2022-04-07 NOTE — Telephone Encounter (Signed)
..  Home Health Verbal Orders  Agency:   [facility]   Requesting OT/ PT/ Skilled nursing/ Social Work/ Speech:   PT  Reason for Request:   Evaluation   Frequency:   2x/week for 3 weeks 1x/week for 3 weeks

## 2022-04-08 ENCOUNTER — Ambulatory Visit (HOSPITAL_COMMUNITY): Payer: PPO

## 2022-04-10 ENCOUNTER — Ambulatory Visit: Payer: PPO | Admitting: Physician Assistant

## 2022-04-13 ENCOUNTER — Ambulatory Visit (HOSPITAL_COMMUNITY): Payer: PPO

## 2022-04-13 ENCOUNTER — Ambulatory Visit (INDEPENDENT_AMBULATORY_CARE_PROVIDER_SITE_OTHER): Payer: PPO

## 2022-04-13 DIAGNOSIS — I442 Atrioventricular block, complete: Secondary | ICD-10-CM | POA: Diagnosis not present

## 2022-04-14 ENCOUNTER — Telehealth: Payer: Self-pay | Admitting: Family Medicine

## 2022-04-14 LAB — CUP PACEART REMOTE DEVICE CHECK
Battery Remaining Longevity: 76 mo
Battery Voltage: 3.02 V
Brady Statistic AP VP Percent: 63.23 %
Brady Statistic AP VS Percent: 0.06 %
Brady Statistic AS VP Percent: 36.66 %
Brady Statistic AS VS Percent: 0.04 %
Brady Statistic RA Percent Paced: 63.22 %
Brady Statistic RV Percent Paced: 99.82 %
Date Time Interrogation Session: 20231002022603
HighPow Impedance: 67 Ohm
Implantable Lead Implant Date: 20100112
Implantable Lead Implant Date: 20170313
Implantable Lead Implant Date: 20170313
Implantable Lead Location: 753858
Implantable Lead Location: 753859
Implantable Lead Location: 753860
Implantable Lead Model: 4598
Implantable Lead Model: 5076
Implantable Pulse Generator Implant Date: 20230330
Lead Channel Impedance Value: 208.568
Lead Channel Impedance Value: 208.568
Lead Channel Impedance Value: 208.568
Lead Channel Impedance Value: 218.5 Ohm
Lead Channel Impedance Value: 218.5 Ohm
Lead Channel Impedance Value: 380 Ohm
Lead Channel Impedance Value: 399 Ohm
Lead Channel Impedance Value: 399 Ohm
Lead Channel Impedance Value: 437 Ohm
Lead Channel Impedance Value: 437 Ohm
Lead Channel Impedance Value: 437 Ohm
Lead Channel Impedance Value: 437 Ohm
Lead Channel Impedance Value: 532 Ohm
Lead Channel Impedance Value: 703 Ohm
Lead Channel Impedance Value: 722 Ohm
Lead Channel Impedance Value: 722 Ohm
Lead Channel Impedance Value: 722 Ohm
Lead Channel Impedance Value: 760 Ohm
Lead Channel Pacing Threshold Amplitude: 0.5 V
Lead Channel Pacing Threshold Amplitude: 1.5 V
Lead Channel Pacing Threshold Amplitude: 1.5 V
Lead Channel Pacing Threshold Pulse Width: 0.4 ms
Lead Channel Pacing Threshold Pulse Width: 0.4 ms
Lead Channel Pacing Threshold Pulse Width: 0.4 ms
Lead Channel Sensing Intrinsic Amplitude: 1 mV
Lead Channel Sensing Intrinsic Amplitude: 1 mV
Lead Channel Setting Pacing Amplitude: 2 V
Lead Channel Setting Pacing Amplitude: 2.5 V
Lead Channel Setting Pacing Amplitude: 3 V
Lead Channel Setting Pacing Pulse Width: 0.4 ms
Lead Channel Setting Pacing Pulse Width: 0.4 ms
Lead Channel Setting Sensing Sensitivity: 0.3 mV

## 2022-04-14 NOTE — Telephone Encounter (Signed)
Called and spoke with Maudie Mercury and VO given.

## 2022-04-14 NOTE — Telephone Encounter (Signed)
Noted  

## 2022-04-14 NOTE — Telephone Encounter (Signed)
.  Home Health Verbal Orders  Agency:  [facility]  Caller: Scheryl Marten, RN [phone]   Requesting OT/ PT/ Skilled nursing/ Social Work/ Speech:  Skilled nursing   Reason for Request:  COPD education  Frequency:  Once a week x 5 weeks   VO can be given by contacting [phone]

## 2022-04-14 NOTE — Telephone Encounter (Signed)
FYI  Caller is Catalina Lunger, Education officer, museum with [facility] states: -Social worker visits will be delayed due to patient's dialysis schedule as well as his other appointments.  - She is set to visit him on 10/12.   If any additional info needed, she can be reached at [phone].

## 2022-04-15 ENCOUNTER — Ambulatory Visit (HOSPITAL_COMMUNITY): Payer: PPO

## 2022-04-16 ENCOUNTER — Other Ambulatory Visit: Payer: Self-pay | Admitting: Podiatry

## 2022-04-20 ENCOUNTER — Ambulatory Visit (HOSPITAL_COMMUNITY): Payer: PPO

## 2022-04-21 ENCOUNTER — Encounter: Payer: Self-pay | Admitting: Physician Assistant

## 2022-04-21 ENCOUNTER — Ambulatory Visit: Payer: PPO | Attending: Physician Assistant | Admitting: Physician Assistant

## 2022-04-21 VITALS — BP 120/68 | HR 92 | Ht 67.0 in | Wt 206.6 lb

## 2022-04-21 DIAGNOSIS — E785 Hyperlipidemia, unspecified: Secondary | ICD-10-CM | POA: Diagnosis not present

## 2022-04-21 DIAGNOSIS — I480 Paroxysmal atrial fibrillation: Secondary | ICD-10-CM

## 2022-04-21 DIAGNOSIS — N186 End stage renal disease: Secondary | ICD-10-CM

## 2022-04-21 DIAGNOSIS — I2510 Atherosclerotic heart disease of native coronary artery without angina pectoris: Secondary | ICD-10-CM

## 2022-04-21 DIAGNOSIS — I10 Essential (primary) hypertension: Secondary | ICD-10-CM | POA: Diagnosis not present

## 2022-04-21 DIAGNOSIS — Z992 Dependence on renal dialysis: Secondary | ICD-10-CM

## 2022-04-21 DIAGNOSIS — E119 Type 2 diabetes mellitus without complications: Secondary | ICD-10-CM

## 2022-04-21 DIAGNOSIS — Z95810 Presence of automatic (implantable) cardiac defibrillator: Secondary | ICD-10-CM

## 2022-04-21 NOTE — Patient Instructions (Signed)
Medication Instructions:  Your physician recommends that you continue on your current medications as directed. Please refer to the Current Medication list given to you today.  *If you need a refill on your cardiac medications before your next appointment, please call your pharmacy*  Lab Work: NONE ordered at this time of appointment   If you have labs (blood work) drawn today and your tests are completely normal, you will receive your results only by: [place] (if you have MyChart) OR A paper copy in the mail If you have any lab test that is abnormal or we need to change your treatment, we will call you to review the results.  Testing/Procedures: NONE ordered at this time of appointment   Follow-Up: At [facility], you and your health needs are our priority.  As part of our continuing mission to provide you with exceptional heart care, we have created designated Provider Care Teams.  These Care Teams include your primary Cardiologist (physician) and Advanced Practice Providers (APPs -  Physician Assistants and Nurse Practitioners) who all work together to provide you with the care you need, when you need it.   Your next appointment:   4-6 week(s)  The format for your next appointment:   In Person  Provider:   Almyra Deforest, PA-C        Other Instructions   Important Information About [place]

## 2022-04-21 NOTE — Progress Notes (Unsigned)
Cardiology Office Note:    Date:  04/22/2022   ID:  Rodney Farley, Rodney Farley [birth year], MRN [id number]  PCP:  [name], MD   [place] Providers Cardiologist:  Jakoby [name], MD     Referring MD: [name], MD   Chief Complaint  Patient presents with   Follow-up    Seen for Dr. [name]    History of Present Illness:    Rodney Farley is a 88 y.o. male with a hx of CAD, hypertension, hyperlipidemia, DM 2, ischemic cardiomyopathy, chronic systolic heart failure, Bell's palsy, COPD, TIA, history of AICD and sleep apnea.  Patient had a remote MI in 60 while in [place].  He underwent PCI of LAD, second diagonal and OM 3 in 2016.  Patient has a history of complete heart block and underwent pacemaker implantation, this was later upgraded to CRT-D in 2017.  Last generator change out was in March 2023.  More recently, patient had NSTEMI on 11/11/2021 and underwent DES to ostial LAD.  EF was initially 25 to 30% in 2016, with subsequent normalization and dropped down to 45 to 50% on most recent echocardiogram on 11/11/2021. He has been noted to have subclinical atrial fibrillation on device interrogation in 2021 and has not been placed on anticoagulation therapy due to low A-fib burden.  He has end-stage renal disease on hemodialysis.  Patient is being followed by both Dr. [name] and Dr. Caryl Comes.  Patient was recently admitted in May for NSTEMI with sharp substernal chest pain radiating to the left upper extremity.  Troponin went up to 4677.  Echocardiogram showed EF 45 to 50% with akinesis of the apical septal segment, apical inferior segment, and the apex.  Cardiac catheterization on 11/12/2021 showed a 90% stenosis in ostial to proximal LAD, 90% stenosis in ostial D1, 70% stenosis in the ostial Croitoru proximal RCA and mild disease in the OM 3, D2 and LPAV.  He underwent successful DES to ostial to proximal LAD with 3.0 x 24 mm Synergy stent.  Postprocedure, he was  continued on aspirin and Plavix with home Lipitor increased.  He was readmitted in June with NSTEMI after presenting with chest pain.  Serial troponin trended up to 1073.  Repeat heart catheter showed a 90% in-stent restenosis of the distal left main to ostial LAD with thrombus and the stent was underexpanded, patient underwent difficult but successful PCI with PTCA of this lesion.  Carvedilol was stopped due to low blood pressure.  Patient was readmitted on 02/08/2022 with NSTEMI.  Troponin peaked at 498.  Repeat cardiac catheterization revealed restenosis of proximal LAD stent.  Intravascular imaging again showed stent was not fully expanded due to calcification.  Patient underwent intracoronary lithotripsy and aggressive dilatation with 4.0 balloon.  Additional stenting was not done due to inability to expand fully.  Patient was last seen on 02/17/2022 by Dr. [name] along with his daughter at which time he was doing well.  Unfortunately, patient was readmitted to the hospital on 03/30/2022 due to recurrent chest pain.  Troponin was minimally elevated but not consistent with ACS.  Cardiac catheterization performed on 03/31/2022 showed a 70% ostial to proximal RCA lesion, 90% distal left main and ostial LAD in-stent restenosis treated with balloon angioplasty.  Hospital course was complicated by possible seizure-like activity, patient underwent an EEG which showed no seizure or epileptic discharges.  Patient presents today for follow-up.  He has not had any further chest pain.  We have reviewed his  recent hospitalization and balloon angioplasty report.  Unfortunately this is the fourth admission this year where he required PCI.  We discussed the possibility of being more aggressive on controlling his risk factors.  His blood pressure is fairly controlled.  His LDL is not bad however it was over 70 last time.  I recommend LDL goal of less than 55 for this particular patient who has recurrent restenosis in the  distal left main and ostial LAD territory.  I discussed the case with our clinical pharmacist, we eventually decided to start the patient on Repatha injection.  I plan to bring the patient back in 4 to 6 weeks for reassessment.  The patient should also have a fasting lipid panel in 8 weeks from now for doses of Repatha.   Past Medical History:  Diagnosis Date   AICD (automatic cardioverter/defibrillator) present    Medtronic pacer   Anemia    Anxiety and depression    Arthritis    Bell's palsy    left side. after shingles episode   BPH associated with nocturia    Chronic systolic CHF (congestive heart failure) ([place])    EF normalized by Echo 2019   COPD (chronic obstructive pulmonary disease) ([place])    Severe   Coronary artery disease    a. s/p MI in 1994/1995 while in [place] s/p questionable PCI. 03/2015: progression of disease, for staged PCI.   Diabetic peripheral neuropathy (HCC)    GERD (gastroesophageal reflux disease)    Gout    Hard of hearing    B/L   History of chronic pancreatitis 07/23/2017   noted on CT abd/pelvis   History of shingles    Hypercholesterolemia    Hypertension    Obesity    Sleep apnea    "sleeps w/humidifyer when he panics and gets short of breath" (04/08/2015)   TIA (transient ischemic attack) X 3   Trigger middle finger of left hand    Type II diabetes mellitus ([place])    Wears glasses    Wears hearing aid     Past Surgical History:  Procedure Laterality Date   APPENDECTOMY     AV FISTULA PLACEMENT Right 02/07/2019   Procedure: ARTERIOVENOUS (AV) FISTULA CREATION RIGHT UPPER ARM;  Surgeon: Waynetta Sandy, MD;  Location: [facility];  Service: Vascular;  Laterality: Right;   BIV ICD GENERATOR CHANGEOUT N/A 10/09/2021   Procedure: BIV ICD GENERATOR CHANGEOUT;  Surgeon: Deboraha Sprang, MD;  Location: [facility];  Service: Cardiovascular;  Laterality: N/A;   CARDIAC CATHETERIZATION N/A 03/29/2015   Procedure: Right/Left Heart Cath and  Coronary Angiography;  Surgeon: Kaito M [name], MD;  Location: [facility];  Service: Cardiovascular;  Laterality: N/A;   [place]   "after my MI; put me on heart RX after cath"   CARDIAC CATHETERIZATION N/A 04/09/2015   Procedure: Coronary Stent Intervention;  Surgeon: Montavis M [name], MD;  Location: [facility];  Service: Cardiovascular;  Laterality: N/A;   CATARACT EXTRACTION W/ INTRAOCULAR LENS  IMPLANT, BILATERAL     CHOLECYSTECTOMY N/A 10/13/2017   Procedure: LAPAROSCOPIC CHOLECYSTECTOMY WITH LYSIS OF ADHESIONS;  Surgeon: Ileana Roup, MD;  Location: [facility];  Service: General;  Laterality: N/A;   COLONOSCOPY     CORONARY ANGIOGRAPHY N/A 11/12/2021   Procedure: CORONARY ANGIOGRAPHY;  Surgeon: Wellington Hampshire, MD;  Location: [facility];  Service: Cardiovascular;  Laterality: N/A;   CORONARY ANGIOGRAPHY N/A 12/24/2021   Procedure:  CORONARY ANGIOGRAPHY;  Surgeon: Troy Sine, MD;  Location: [facility];  Service: Cardiovascular;  Laterality: N/A;   CORONARY ANGIOGRAPHY N/A 03/31/2022   Procedure: CORONARY ANGIOGRAPHY;  Surgeon: Jettie Booze, MD;  Location: [facility];  Service: Cardiovascular;  Laterality: N/A;   CORONARY BALLOON ANGIOPLASTY N/A 12/24/2021   Procedure: CORONARY BALLOON ANGIOPLASTY;  Surgeon: Troy Sine, MD;  Location: [facility];  Service: Cardiovascular;  Laterality: N/A;   CORONARY BALLOON ANGIOPLASTY N/A 02/09/2022   Procedure: CORONARY BALLOON ANGIOPLASTY;  Surgeon: Nelva Bush, MD;  Location: [facility];  Service: Cardiovascular;  Laterality: N/A;  LAD   CORONARY BALLOON ANGIOPLASTY N/A 03/31/2022   Procedure: CORONARY BALLOON ANGIOPLASTY;  Surgeon: Jettie Booze, MD;  Location: [facility];  Service: Cardiovascular;  Laterality: N/A;   CORONARY STENT INTERVENTION N/A 11/12/2021   Procedure: CORONARY STENT INTERVENTION;  Surgeon: Wellington Hampshire, MD;  Location: [facility];  Service: Cardiovascular;  Laterality: N/A;  LAD   DENTAL SURGERY     EP IMPLANTABLE DEVICE N/A 09/23/2015   MDT CRT-D, Dr. Caryl Comes   HIATAL HERNIA REPAIR  1977   ILEOCECETOMY N/A 03/27/2017   Procedure: ILEOCECECTOMY;  Surgeon: Ileana Roup, MD;  Location: [facility];  Service: General;  Laterality: N/A;   INSERT / REPLACE / REMOVE PACEMAKER  07/2008   Complete heart block status post DDD with good function   INTRAVASCULAR IMAGING/OCT N/A 12/24/2021   Procedure: INTRAVASCULAR IMAGING/OCT;  Surgeon: Troy Sine, MD;  Location: [facility];  Service: Cardiovascular;  Laterality: N/A;   INTRAVASCULAR LITHOTRIPSY  02/09/2022   Procedure: INTRAVASCULAR LITHOTRIPSY;  Surgeon: Nelva Bush, MD;  Location: [facility];  Service: Cardiovascular;;  LAD   INTRAVASCULAR ULTRASOUND/IVUS N/A 02/09/2022   Procedure: Intravascular Ultrasound/IVUS;  Surgeon: Nelva Bush, MD;  Location: [facility];  Service: Cardiovascular;  Laterality: N/A;  LAD   IR FLUORO GUIDE CV LINE RIGHT  04/03/2019   IR US GUIDE VASC ACCESS RIGHT  04/03/2019   LAPAROTOMY N/A 03/27/2017   Procedure: EXPLORATORY LAPAROTOMY;  Surgeon: Ileana Roup, MD;  Location: [facility];  Service: General;  Laterality: N/A;   RIGHT HEART CATH AND CORONARY ANGIOGRAPHY N/A 02/09/2022   Procedure: RIGHT HEART CATH AND CORONARY ANGIOGRAPHY;  Surgeon: Nelva Bush, MD;  Location: [facility];  Service: Cardiovascular;  Laterality: N/A;   TONSILLECTOMY     UPPER GI ENDOSCOPY      Current Medications: Current Meds  Medication Sig   aspirin EC 81 MG tablet Take 81 mg by mouth daily.   atorvastatin (LIPITOR) 80 MG tablet Take 1 tablet by mouth daily.   carbamide peroxide (DEBROX) 6.5 % OTIC solution Place 5 drops into both ears 2 (two) times daily as needed (earwax).   Carboxymethylcellul-Glycerin (LUBRICATING EYE DROPS OP) Place 1 drop into both eyes 3 (three) times daily as needed (dry eyes).    clopidogrel (PLAVIX) 75 MG tablet Take 1 tablet (75 mg total) by mouth daily with breakfast.   gabapentin (NEURONTIN) 300 MG capsule Take 1 capsule (300 mg total) by mouth 2 (two) times daily.   glucose blood (FREESTYLE TEST STRIPS) test strip Use to check blood sugar daily   glucose monitoring kit (FREESTYLE) monitoring kit USE TO MONITOR BLOOD GLUCOSE AS DIRECTED   Methoxy PEG-Epoetin Beta (MIRCERA IJ) as directed.   midodrine (PROAMATINE) 10 MG tablet Take 0.5-1 tablets (5-10 mg total) by mouth  See admin instructions. Take 1 tablet (10 mg) three times a week. Add 5 mg a night after dialysis as needed for low blood pressure.   multivitamin (RENA-VIT) TABS tablet Take 1 tablet by mouth daily.   nitroGLYCERIN (NITROSTAT) 0.4 MG SL tablet Place 1 tablet under the tongue every 5 minutes x 3 doses as needed for chest pain.   oxymetazoline (AFRIN) 0.05 % nasal spray Place 1 spray into both nostrils 2 (two) times daily as needed for congestion.   pantoprazole (PROTONIX) 40 MG tablet Take 1 tablet by mouth daily.   saccharomyces boulardii (FLORASTOR) 250 MG capsule Take 1 capsule (250 mg total) by mouth 2 (two) times daily.   sevelamer carbonate (RENVELA) 800 MG tablet Take 2 tablets (1,600 mg total) by mouth 3 (three) times daily with meals. (Patient taking differently: Take 1,600 mg by mouth See admin instructions. Take 2 tablets by mouth three times daily with meals and 1 tablet with snacks)   vitamin B-12 (CYANOCOBALAMIN) 1000 MCG tablet Take 1,000 mcg by mouth daily.     Allergies:   Bee venom, Lyrica [pregabalin], Prednisone, and Zocor [simvastatin]   Social History   Socioeconomic History   Marital status: Legally Separated    Spouse name: Not on file   Number of children: 1   Years of education: college   Highest education level: Not on file  Occupational History   Occupation: Retired  Tobacco Use   Smoking status: Former    Packs/day: 1.50    Years: 54.00    Total pack years:  81.00    Types: Cigarettes    Quit date: 07/18/2007    Years since quitting: 14.7    Passive exposure: Never   Smokeless tobacco: Never  Vaping Use   Vaping Use: Never used  Substance and Sexual Activity   Alcohol use: Yes    Comment: rare   Drug use: No   Sexual activity: Not Currently  Other Topics Concern   Not on file  Social History Narrative   Married 1994 (together since 1989) 1 son, 1 stepson. 3 grandkids, 6 great grandkids      Retired from [employer]. Had 2 years of collge.       Faith: Mormon      Here on [employer] since 2004 from [place];    Social Determinants of Health   Financial Resource Strain: Not on file  Food Insecurity: Not on file  Transportation Needs: Not on file  Physical Activity: Not on file  Stress: Not on file  Social Connections: Not on file     Family History: [name] family history includes Heart attack in his brother; Leukemia in his father; Stroke in his mother and sister.  ROS:   Please see the history of present illness.     All other systems reviewed and are negative.  EKGs/Labs/Other Studies Reviewed:    The following studies were reviewed today:  Cath 03/31/2022   Ost RCA to Prox RCA lesion is 70% stenosed.   Prox Cx lesion is 30% stenosed.   Mid Cx to Dist Cx lesion is 40% stenosed.   Mid Cx lesion is 30% stenosed.   2nd Diag-1 lesion is 30% stenosed.   3rd Mrg lesion is 50% stenosed.   Non-stenotic 2nd Diag-2 lesion was previously treated.   Dist LM to Ost LAD lesion is 90% stenosed. In stent restenosis.   Scoring balloon angioplasty was performed using a BALLN WOLVERINE 3.50X10, followed by PTCA with 4.0  [place] balloon.   Post intervention, there is a 20% residual stenosis.   Continue dual antiplatelet therapy.  I spoke with [name], his son, regarding the results of the procedure.  Same proximal LAD area continues with recurrent restenosis.  Cutting Balloon used on this occasion.  Perhaps of drug-coated  balloon was available at some point, that would be an option.   Plan is for dialysis tomorrow, per the son.  EKG:  EKG is ordered today.  The ekg ordered today demonstrates atrial sensed ventricularly paced rhythm.  Recent Labs: 11/11/2021: TSH 1.400 03/30/2022: Magnesium 2.0 04/01/2022: ALT 21; BUN 22; Creatinine, Ser 6.22; Hemoglobin 10.3; Platelets 125; Potassium 4.6; Sodium 139  Recent Lipid Panel    Component Value Date/Time   CHOL 153 01/06/2022 1228   TRIG 197 (H) 01/06/2022 1228   HDL 41 01/06/2022 1228   CHOLHDL 3.7 01/06/2022 1228   CHOLHDL 4.2 11/12/2021 0554   VLDL 47 (H) 11/12/2021 0554   LDLCALC 79 01/06/2022 1228   LDLDIRECT 60.0 04/13/2018 0926     Risk Assessment/Calculations:           Physical Exam:    VS:  BP 120/68   Pulse 92   Ht '5\' 7"'  (1.702 m)   Wt 206 lb 9.6 oz (93.7 kg)   SpO2 95%   BMI 32.36 kg/m         Wt Readings from Last 3 Encounters:  04/21/22 206 lb 9.6 oz (93.7 kg)  04/02/22 212 lb 11.9 oz (96.5 kg)  03/19/22 217 lb 9.5 oz (98.7 kg)     GEN:  Well nourished, well developed in no acute distress HEENT: Normal NECK: No JVD; No carotid bruits LYMPHATICS: No lymphadenopathy CARDIAC: RRR, no murmurs, rubs, gallops RESPIRATORY:  Clear to auscultation without rales, wheezing or rhonchi  ABDOMEN: Soft, non-tender, non-distended MUSCULOSKELETAL:  No edema; No deformity  SKIN: Warm and dry NEUROLOGIC:  Alert and oriented x 3 PSYCHIATRIC:  Normal affect   ASSESSMENT:    1. Coronary artery disease involving native coronary artery of native heart without angina pectoris   2. Primary hypertension   3. Hyperlipidemia LDL goal <70   4. Controlled type 2 diabetes mellitus without complication, without long-term current use of insulin ([place])   5. PAF (paroxysmal atrial fibrillation) ([place])   6. ESRD on dialysis [facility])    PLAN:    In order of problems listed above:  CAD: Denies any further chest discomfort.  Unfortunately patient  underwent cardiac catheterization and PCI 4 different times this year in May, June, July and again in September.  Area that had to have in-stent restenosis is near the distal left main and ostial LAD.  We discussed importance of more aggressive risk factor controls.  His blood pressure is well controlled.  We will start him on Repatha to try to lower his LDL to less than 55  Hypertension: Blood pressure stable  Hyperlipidemia: On Lipitor, however LDL is only borderline controlled, I think with 4 different PCI in the last 5 months, we need to be more aggressive on controlling the risk factors.  I discussed the case with our clinical pharmacist who has given him Repatha.  DM2: Managed by primary care provider  PAF: Subclinical atrial fibrillation noted on previous device interrogation, not on anticoagulation given low A-fib burden  End-stage renal disease: Managed by nephrology service  History of CRT-D: Patient has a history of complete heart block, initially had a pacemaker placed, this was later upgraded to CRT-D.  Followed by Dr. Caryl Comes.           Medication Adjustments/Labs and Tests Ordered: Current medicines are reviewed at length with the patient today.  Concerns regarding medicines are outlined above.  Orders Placed This Encounter  Procedures   EKG 12-Lead   No orders of the defined types were placed in this encounter.   Patient Instructions  Medication Instructions:  Your physician recommends that you continue on your current medications as directed. Please refer to the Current Medication list given to you today.  *If you need a refill on your cardiac medications before your next appointment, please call your pharmacy*  Lab Work: NONE ordered at this time of appointment   If you have labs (blood work) drawn today and your tests are completely normal, you will receive your results only by: [place] (if you have MyChart) OR A paper copy in the mail If you have any  lab test that is abnormal or we need to change your treatment, we will call you to review the results.  Testing/Procedures: NONE ordered at this time of appointment   Follow-Up: At [facility], you and your health needs are our priority.  As part of our continuing mission to provide you with exceptional heart care, we have created designated Provider Care Teams.  These Care Teams include your primary Cardiologist (physician) and Advanced Practice Providers (APPs -  Physician Assistants and Nurse Practitioners) who all work together to provide you with the care you need, when you need it.   Your next appointment:   4-6 week(s)  The format for your next appointment:   In Person  Provider:   Almyra Deforest, PA-C        Other Instructions   Important Information About Sugar         Hilbert Corrigan, [place]  04/22/2022 11:33 PM    [place]

## 2022-04-22 ENCOUNTER — Telehealth: Payer: Self-pay | Admitting: Pharmacist Clinician (PhC)/ Clinical Pharmacy Specialist

## 2022-04-22 ENCOUNTER — Encounter: Payer: Self-pay | Admitting: Physician Assistant

## 2022-04-22 ENCOUNTER — Ambulatory Visit (HOSPITAL_COMMUNITY): Payer: PPO

## 2022-04-22 MED ORDER — REPATHA SURECLICK 140 MG/ML ~~LOC~~ SOAJ: 140.0000 mg | SUBCUTANEOUS | 12 refills | Status: AC

## 2022-04-22 NOTE — Telephone Encounter (Signed)
PA approved to 10/21/22  Spoke with son and prescription sent to [facility] [id number]

## 2022-04-27 ENCOUNTER — Ambulatory Visit (HOSPITAL_COMMUNITY): Payer: PPO

## 2022-04-29 ENCOUNTER — Ambulatory Visit (HOSPITAL_COMMUNITY): Payer: PPO

## 2022-04-29 NOTE — Progress Notes (Signed)
Remote ICD transmission.   

## 2022-04-30 ENCOUNTER — Telehealth: Payer: Self-pay | Admitting: Cardiology

## 2022-04-30 NOTE — Telephone Encounter (Signed)
Spoke with Maudie Mercury at [facility] to inform her that patient is to take atorvastatin and repatha per PharmD. She verbalized understanding.

## 2022-04-30 NOTE — Telephone Encounter (Signed)
LMTCB

## 2022-04-30 NOTE — Telephone Encounter (Signed)
Yes, patient is to continue both

## 2022-04-30 NOTE — Telephone Encounter (Signed)
Pt c/o medication issue:  1. Name of Medication: atorvastatin (LIPITOR) 80 MG tablet Evolocumab (REPATHA SURECLICK) 358 MG/ML SOAJ  2. How are you currently taking this medication (dosage and times per day)? As prescribed   3. Are you having a reaction (difficulty breathing--STAT)?   4. What is your medication issue? [facility] is requesting a call back to discuss if this patient is to continue taking both Atorvastatin and the Repatha together. Please advise.

## 2022-04-30 NOTE — Telephone Encounter (Signed)
Kim at [facility] asked if patient is to be on atorvastatin 80mg  with Repatha 140mg  every 14 days. Please advise.

## 2022-05-01 ENCOUNTER — Telehealth: Payer: Self-pay | Admitting: Family Medicine

## 2022-05-01 NOTE — Telephone Encounter (Signed)
Copied from [place] [id number]. Topic: Medicare AWV >> May 01, 2022  9:50 AM Jae Dire wrote: Reason for CRM: Left message for patient to call back and schedule Medicare Annual Wellness Visit (AWV) in office.   If unable to come into the office for AWV,  please offer to do virtually or by telephone.  Last AWV: 08/01/2019  Please schedule at any time with [place].  30 minute appointment for Virtual or phone 45 minute appointment for in office or Initial virtual/phone  Any questions, please contact me at [phone]

## 2022-05-04 ENCOUNTER — Ambulatory Visit (HOSPITAL_COMMUNITY): Payer: PPO

## 2022-05-05 ENCOUNTER — Telehealth: Payer: Self-pay | Admitting: Family Medicine

## 2022-05-05 NOTE — Telephone Encounter (Signed)
Agency:   [facility]   Requesting OT/ PT/ Skilled nursing/ Social Work/ Speech:   PT  Reason for Request:   Extension   Frequency:   1x/week for 4 weeks

## 2022-05-05 NOTE — Telephone Encounter (Signed)
..  Home Health Certification or Plan of Care Tracking  Is this a Certification or Plan of Care? yes  [place] Agency: [facility]  Order Number:  [id number]  Has charge sheet been attached? yes  Where has form been placed:  In provider's box  Faxed to:   [phone]

## 2022-05-06 ENCOUNTER — Ambulatory Visit (HOSPITAL_COMMUNITY): Payer: PPO

## 2022-05-06 ENCOUNTER — Encounter (HOSPITAL_COMMUNITY): Payer: Self-pay | Admitting: Family Medicine

## 2022-05-06 ENCOUNTER — Inpatient Hospital Stay (HOSPITAL_COMMUNITY)
Admission: EM | Admit: 2022-05-06 | Discharge: 2022-05-08 | DRG: 250 | Disposition: A | Discharge status: Home Health | Payer: PPO | Attending: Internal Medicine | Admitting: Internal Medicine

## 2022-05-06 ENCOUNTER — Emergency Department (HOSPITAL_COMMUNITY): Payer: PPO

## 2022-05-06 DIAGNOSIS — M109 Gout, unspecified: Secondary | ICD-10-CM | POA: Diagnosis present

## 2022-05-06 DIAGNOSIS — I350 Nonrheumatic aortic (valve) stenosis: Secondary | ICD-10-CM

## 2022-05-06 DIAGNOSIS — I132 Hypertensive heart and chronic kidney disease with heart failure and with stage 5 chronic kidney disease, or end stage renal disease: Secondary | ICD-10-CM | POA: Diagnosis present

## 2022-05-06 DIAGNOSIS — D7589 Other specified diseases of blood and blood-forming organs: Secondary | ICD-10-CM

## 2022-05-06 DIAGNOSIS — I25110 Atherosclerotic heart disease of native coronary artery with unstable angina pectoris: Secondary | ICD-10-CM

## 2022-05-06 DIAGNOSIS — E785 Hyperlipidemia, unspecified: Secondary | ICD-10-CM | POA: Diagnosis present

## 2022-05-06 DIAGNOSIS — T82855A Stenosis of coronary artery stent, initial encounter: Principal | ICD-10-CM | POA: Diagnosis present

## 2022-05-06 DIAGNOSIS — Z91030 Bee allergy status: Secondary | ICD-10-CM

## 2022-05-06 DIAGNOSIS — R7989 Other specified abnormal findings of blood chemistry: Secondary | ICD-10-CM | POA: Diagnosis present

## 2022-05-06 DIAGNOSIS — E7800 Pure hypercholesterolemia, unspecified: Secondary | ICD-10-CM | POA: Diagnosis present

## 2022-05-06 DIAGNOSIS — I950 Idiopathic hypotension: Secondary | ICD-10-CM

## 2022-05-06 DIAGNOSIS — J449 Chronic obstructive pulmonary disease, unspecified: Secondary | ICD-10-CM | POA: Diagnosis present

## 2022-05-06 DIAGNOSIS — I200 Unstable angina: Secondary | ICD-10-CM

## 2022-05-06 DIAGNOSIS — E1122 Type 2 diabetes mellitus with diabetic chronic kidney disease: Secondary | ICD-10-CM | POA: Diagnosis present

## 2022-05-06 DIAGNOSIS — Z950 Presence of cardiac pacemaker: Secondary | ICD-10-CM | POA: Diagnosis present

## 2022-05-06 DIAGNOSIS — I272 Pulmonary hypertension, unspecified: Secondary | ICD-10-CM | POA: Diagnosis present

## 2022-05-06 DIAGNOSIS — Z87891 Personal history of nicotine dependence: Secondary | ICD-10-CM

## 2022-05-06 DIAGNOSIS — E1142 Type 2 diabetes mellitus with diabetic polyneuropathy: Secondary | ICD-10-CM | POA: Diagnosis present

## 2022-05-06 DIAGNOSIS — Y831 Surgical operation with implant of artificial internal device as the cause of abnormal reaction of the patient, or of later complication, without mention of misadventure at the time of the procedure: Secondary | ICD-10-CM | POA: Diagnosis present

## 2022-05-06 DIAGNOSIS — R079 Chest pain, unspecified: Secondary | ICD-10-CM | POA: Diagnosis present

## 2022-05-06 DIAGNOSIS — D631 Anemia in chronic kidney disease: Secondary | ICD-10-CM | POA: Diagnosis present

## 2022-05-06 DIAGNOSIS — Z888 Allergy status to other drugs, medicaments and biological substances status: Secondary | ICD-10-CM

## 2022-05-06 DIAGNOSIS — Z8673 Personal history of transient ischemic attack (TIA), and cerebral infarction without residual deficits: Secondary | ICD-10-CM

## 2022-05-06 DIAGNOSIS — I9589 Other hypotension: Secondary | ICD-10-CM | POA: Diagnosis present

## 2022-05-06 DIAGNOSIS — Z9861 Coronary angioplasty status: Secondary | ICD-10-CM

## 2022-05-06 DIAGNOSIS — I480 Paroxysmal atrial fibrillation: Secondary | ICD-10-CM | POA: Diagnosis present

## 2022-05-06 DIAGNOSIS — M898X9 Other specified disorders of bone, unspecified site: Secondary | ICD-10-CM | POA: Diagnosis present

## 2022-05-06 DIAGNOSIS — N186 End stage renal disease: Secondary | ICD-10-CM

## 2022-05-06 DIAGNOSIS — Z823 Family history of stroke: Secondary | ICD-10-CM

## 2022-05-06 DIAGNOSIS — E119 Type 2 diabetes mellitus without complications: Secondary | ICD-10-CM

## 2022-05-06 DIAGNOSIS — N189 Chronic kidney disease, unspecified: Secondary | ICD-10-CM | POA: Diagnosis present

## 2022-05-06 DIAGNOSIS — I080 Rheumatic disorders of both mitral and aortic valves: Secondary | ICD-10-CM | POA: Diagnosis present

## 2022-05-06 DIAGNOSIS — K219 Gastro-esophageal reflux disease without esophagitis: Secondary | ICD-10-CM | POA: Diagnosis present

## 2022-05-06 DIAGNOSIS — I5022 Chronic systolic (congestive) heart failure: Secondary | ICD-10-CM | POA: Diagnosis present

## 2022-05-06 DIAGNOSIS — Z6832 Body mass index (BMI) 32.0-32.9, adult: Secondary | ICD-10-CM

## 2022-05-06 DIAGNOSIS — R34 Anuria and oliguria: Secondary | ICD-10-CM | POA: Diagnosis present

## 2022-05-06 DIAGNOSIS — G4733 Obstructive sleep apnea (adult) (pediatric): Secondary | ICD-10-CM | POA: Diagnosis present

## 2022-05-06 DIAGNOSIS — Z7982 Long term (current) use of aspirin: Secondary | ICD-10-CM

## 2022-05-06 DIAGNOSIS — M1990 Unspecified osteoarthritis, unspecified site: Secondary | ICD-10-CM | POA: Diagnosis present

## 2022-05-06 DIAGNOSIS — I214 Non-ST elevation (NSTEMI) myocardial infarction: Secondary | ICD-10-CM

## 2022-05-06 DIAGNOSIS — G1221 Amyotrophic lateral sclerosis: Secondary | ICD-10-CM | POA: Diagnosis present

## 2022-05-06 DIAGNOSIS — G4730 Sleep apnea, unspecified: Secondary | ICD-10-CM | POA: Diagnosis present

## 2022-05-06 DIAGNOSIS — Z95810 Presence of automatic (implantable) cardiac defibrillator: Secondary | ICD-10-CM

## 2022-05-06 DIAGNOSIS — Z8249 Family history of ischemic heart disease and other diseases of the circulatory system: Secondary | ICD-10-CM

## 2022-05-06 DIAGNOSIS — E669 Obesity, unspecified: Secondary | ICD-10-CM | POA: Diagnosis present

## 2022-05-06 DIAGNOSIS — I959 Hypotension, unspecified: Secondary | ICD-10-CM | POA: Diagnosis present

## 2022-05-06 DIAGNOSIS — Z79899 Other long term (current) drug therapy: Secondary | ICD-10-CM

## 2022-05-06 DIAGNOSIS — R351 Nocturia: Secondary | ICD-10-CM | POA: Diagnosis present

## 2022-05-06 DIAGNOSIS — H9193 Unspecified hearing loss, bilateral: Secondary | ICD-10-CM | POA: Diagnosis present

## 2022-05-06 DIAGNOSIS — I209 Angina pectoris, unspecified: Secondary | ICD-10-CM

## 2022-05-06 DIAGNOSIS — I4891 Unspecified atrial fibrillation: Secondary | ICD-10-CM | POA: Diagnosis present

## 2022-05-06 DIAGNOSIS — I255 Ischemic cardiomyopathy: Secondary | ICD-10-CM | POA: Diagnosis present

## 2022-05-06 DIAGNOSIS — N401 Enlarged prostate with lower urinary tract symptoms: Secondary | ICD-10-CM | POA: Diagnosis present

## 2022-05-06 DIAGNOSIS — I252 Old myocardial infarction: Secondary | ICD-10-CM

## 2022-05-06 DIAGNOSIS — E875 Hyperkalemia: Secondary | ICD-10-CM | POA: Diagnosis present

## 2022-05-06 DIAGNOSIS — Z992 Dependence on renal dialysis: Secondary | ICD-10-CM

## 2022-05-06 DIAGNOSIS — I442 Atrioventricular block, complete: Secondary | ICD-10-CM | POA: Diagnosis present

## 2022-05-06 DIAGNOSIS — Z7902 Long term (current) use of antithrombotics/antiplatelets: Secondary | ICD-10-CM

## 2022-05-06 DIAGNOSIS — F419 Anxiety disorder, unspecified: Secondary | ICD-10-CM | POA: Diagnosis present

## 2022-05-06 LAB — CBC WITH DIFFERENTIAL/PLATELET
Abs Immature Granulocytes: 0.03 10*3/uL (ref 0.00–0.07)
Basophils Absolute: 0 10*3/uL (ref 0.0–0.1)
Basophils Relative: 0 %
Eosinophils Absolute: 0.1 10*3/uL (ref 0.0–0.5)
Eosinophils Relative: 2 %
HCT: 33.1 % — ABNORMAL LOW (ref 39.0–52.0)
Hemoglobin: 10.4 g/dL — ABNORMAL LOW (ref 13.0–17.0)
Immature Granulocytes: 0 %
Lymphocytes Relative: 13 %
Lymphs Abs: 1.1 10*3/uL (ref 0.7–4.0)
MCH: 34.3 pg — ABNORMAL HIGH (ref 26.0–34.0)
MCHC: 31.4 g/dL (ref 30.0–36.0)
MCV: 109.2 fL — ABNORMAL HIGH (ref 80.0–100.0)
Monocytes Absolute: 0.6 10*3/uL (ref 0.1–1.0)
Monocytes Relative: 7 %
Neutro Abs: 6.3 10*3/uL (ref 1.7–7.7)
Neutrophils Relative %: 78 %
Platelets: 137 10*3/uL — ABNORMAL LOW (ref 150–400)
RBC: 3.03 MIL/uL — ABNORMAL LOW (ref 4.22–5.81)
RDW: 15.8 % — ABNORMAL HIGH (ref 11.5–15.5)
WBC: 8.2 10*3/uL (ref 4.0–10.5)
nRBC: 0 % (ref 0.0–0.2)

## 2022-05-06 LAB — TROPONIN I (HIGH SENSITIVITY)
Troponin I (High Sensitivity): 220 ng/L (ref <18)
Troponin I (High Sensitivity): 275 ng/L (ref <18)

## 2022-05-06 LAB — COMPREHENSIVE METABOLIC PANEL
ALT: 22 U/L (ref 0–44)
AST: 19 U/L (ref 15–41)
Albumin: 3.5 g/dL (ref 3.5–5.0)
Alkaline Phosphatase: 95 U/L (ref 38–126)
Anion gap: 14 (ref 5–15)
BUN: 34 mg/dL — ABNORMAL HIGH (ref 8–23)
CO2: 29 mmol/L (ref 22–32)
Calcium: 9.8 mg/dL (ref 8.9–10.3)
Chloride: 100 mmol/L (ref 98–111)
Creatinine, Ser: 8.62 mg/dL — ABNORMAL HIGH (ref 0.61–1.24)
GFR, Estimated: 5 mL/min — ABNORMAL LOW (ref >60)
Glucose, Bld: 261 mg/dL — ABNORMAL HIGH (ref 70–99)
Potassium: 5.4 mmol/L — ABNORMAL HIGH (ref 3.5–5.1)
Sodium: 143 mmol/L (ref 135–145)
Total Bilirubin: 0.3 mg/dL (ref 0.3–1.2)
Total Protein: 6.4 g/dL — ABNORMAL LOW (ref 6.5–8.1)

## 2022-05-06 MED ORDER — CLOPIDOGREL BISULFATE 75 MG PO TABS: 75.0000 mg | ORAL_TABLET | Freq: Every day | ORAL | Status: DC

## 2022-05-06 MED ORDER — SODIUM ZIRCONIUM CYCLOSILICATE 10 G PO PACK: 10.0000 g | PACK | Freq: Once | ORAL | Status: DC

## 2022-05-06 MED ORDER — ISOSORBIDE MONONITRATE ER 30 MG PO TB24
30.0000 mg | ORAL_TABLET | Freq: Every day | ORAL | Status: DC
  Administered 2022-05-07: 30 mg via ORAL
  Filled 2022-05-06: qty 1

## 2022-05-06 MED ORDER — CARVEDILOL 3.125 MG PO TABS
3.1250 mg | ORAL_TABLET | Freq: Two times a day (BID) | ORAL | Status: DC
  Administered 2022-05-07: 3.125 mg via ORAL
  Filled 2022-05-06: qty 1

## 2022-05-06 MED ORDER — CHLORHEXIDINE GLUCONATE CLOTH 2 % EX PADS
6.0000 | MEDICATED_PAD | Freq: Every day | CUTANEOUS | Status: DC
  Administered 2022-05-08: 6 via TOPICAL

## 2022-05-06 MED ORDER — HEPARIN (PORCINE) 25000 UT/250ML-% IV SOLN
1300.0000 [IU]/h | INTRAVENOUS | Status: DC
  Administered 2022-05-07: 1100 [IU]/h via INTRAVENOUS
  Filled 2022-05-06: qty 250

## 2022-05-06 MED ORDER — MIDODRINE HCL 5 MG PO TABS
10.0000 mg | ORAL_TABLET | Freq: Once | ORAL | Status: AC
  Administered 2022-05-06: 10 mg via ORAL
  Filled 2022-05-06: qty 2

## 2022-05-06 MED ORDER — SODIUM CHLORIDE 0.9% FLUSH
3.0000 mL | Freq: Two times a day (BID) | INTRAVENOUS | Status: DC
  Administered 2022-05-07: 3 mL via INTRAVENOUS

## 2022-05-06 MED ORDER — HEPARIN BOLUS VIA INFUSION
4000.0000 [IU] | Freq: Once | INTRAVENOUS | Status: AC
  Administered 2022-05-07: 4000 [IU] via INTRAVENOUS
  Filled 2022-05-06: qty 4000

## 2022-05-06 MED ORDER — ASPIRIN 81 MG PO CHEW
324.0000 mg | CHEWABLE_TABLET | Freq: Once | ORAL | Status: DC
  Filled 2022-05-06: qty 4

## 2022-05-06 MED ORDER — ASPIRIN 81 MG PO CHEW: 81.0000 mg | CHEWABLE_TABLET | Freq: Every day | ORAL | Status: DC

## 2022-05-06 NOTE — Consult Note (Signed)
[facility] Renal Consultation Note  Requesting MD: Myrene Buddy, MD Indication for Consultation:  ESRD  Chief complaint: chest pain   HPI:  Rodney Farley is a 88 y.o. male with a history of ESRD, CAD, ischemic cardiomyopathy, HLD, T2DM, HTN, and severe COPD who presented to the ER with chest pain and shortness of breath.  The pain caused him to miss HD today.  He reports a shortened HD treatment on Monday.  Cardiology has been consulted and plans for cath tomorrow.  He states that he feels better now - breathing and chest pain are better, "I feel like a fraud because I feel better now".  He states that he doesn't want to be any trouble and I let him know that he has had legitimate angina and CAD.  He has been on midodrine pre-HD and I let him know that I'll order this.  He thinks that he can pull off 2kg without an issue.  Cardiology is concerned about restenosis of a stent.   PMHx:   Past Medical History:  Diagnosis Date   AICD (automatic cardioverter/defibrillator) present    [employer] pacer   Anemia    Anxiety and depression    Arthritis    Bell's palsy    left side. after shingles episode   BPH associated with nocturia    Chronic systolic CHF (congestive heart failure) ([place])    EF normalized by Echo 2019   COPD (chronic obstructive pulmonary disease) ([place])    Severe   Coronary artery disease    a. s/p MI in 1994/1995 while in [place] s/p questionable PCI. 03/2015: progression of disease, for staged PCI.   Diabetic peripheral neuropathy (HCC)    GERD (gastroesophageal reflux disease)    Gout    Hard of hearing    B/L   History of chronic pancreatitis 07/23/2017   noted on CT abd/pelvis   History of shingles    Hypercholesterolemia    Hypertension    Obesity    Sleep apnea    "sleeps w/humidifyer when he panics and gets short of breath" (04/08/2015)   TIA (transient ischemic attack) X 3   Trigger middle finger of left hand    Type II diabetes  mellitus ([place])    Wears glasses    Wears hearing aid     Past Surgical History:  Procedure Laterality Date   APPENDECTOMY     AV FISTULA PLACEMENT Right 02/07/2019   Procedure: ARTERIOVENOUS (AV) FISTULA CREATION RIGHT UPPER ARM;  Surgeon: Waynetta Sandy, MD;  Location: [facility];  Service: Vascular;  Laterality: Right;   BIV ICD GENERATOR CHANGEOUT N/A 10/09/2021   Procedure: BIV ICD GENERATOR CHANGEOUT;  Surgeon: Deboraha Sprang, MD;  Location: [facility];  Service: Cardiovascular;  Laterality: N/A;   CARDIAC CATHETERIZATION N/A 03/29/2015   Procedure: Right/Left Heart Cath and Coronary Angiography;  Surgeon: Acelin M [name], MD;  Location: [place];  Service: Cardiovascular;  Laterality: N/A;   [place]   "after my MI; put me on heart RX after cath"   CARDIAC CATHETERIZATION N/A 04/09/2015   Procedure: Coronary Stent Intervention;  Surgeon: Kurt M [name], MD;  Location: [place] [facility];  Service: Cardiovascular;  Laterality: N/A;   CATARACT EXTRACTION W/ INTRAOCULAR LENS  IMPLANT, BILATERAL     CHOLECYSTECTOMY N/A 10/13/2017   Procedure: LAPAROSCOPIC CHOLECYSTECTOMY WITH LYSIS OF ADHESIONS;  Surgeon: Ileana Roup, MD;  Location: [facility];  Service: General;  Laterality:  N/A;   COLONOSCOPY     CORONARY ANGIOGRAPHY N/A 11/12/2021   Procedure: CORONARY ANGIOGRAPHY;  Surgeon: Wellington Hampshire, MD;  Location: [place] [facility];  Service: Cardiovascular;  Laterality: N/A;   CORONARY ANGIOGRAPHY N/A 12/24/2021   Procedure: CORONARY ANGIOGRAPHY;  Surgeon: Troy Sine, MD;  Location: [place];  Service: Cardiovascular;  Laterality: N/A;   CORONARY ANGIOGRAPHY N/A 03/31/2022   Procedure: CORONARY ANGIOGRAPHY;  Surgeon: Jettie Booze, MD;  Location: [place] [facility];  Service: Cardiovascular;  Laterality: N/A;   CORONARY BALLOON ANGIOPLASTY N/A 12/24/2021   Procedure: CORONARY BALLOON ANGIOPLASTY;  Surgeon: Troy Sine, MD;   Location: [place] [facility] [facility];  Service: Cardiovascular;  Laterality: N/A;   CORONARY BALLOON ANGIOPLASTY N/A 02/09/2022   Procedure: CORONARY BALLOON ANGIOPLASTY;  Surgeon: Nelva Bush, MD;  Location: [place] [facility];  Service: Cardiovascular;  Laterality: N/A;  LAD   CORONARY BALLOON ANGIOPLASTY N/A 03/31/2022   Procedure: CORONARY BALLOON ANGIOPLASTY;  Surgeon: Jettie Booze, MD;  Location: [place] [facility];  Service: Cardiovascular;  Laterality: N/A;   CORONARY STENT INTERVENTION N/A 11/12/2021   Procedure: CORONARY STENT INTERVENTION;  Surgeon: Wellington Hampshire, MD;  Location: [place] [facility];  Service: Cardiovascular;  Laterality: N/A;  LAD   DENTAL SURGERY     EP IMPLANTABLE DEVICE N/A 09/23/2015   MDT CRT-D, Dr. Caryl Comes   HIATAL HERNIA REPAIR  1977   ILEOCECETOMY N/A 03/27/2017   Procedure: ILEOCECECTOMY;  Surgeon: Ileana Roup, MD;  Location: [facility];  Service: General;  Laterality: N/A;   INSERT / REPLACE / REMOVE PACEMAKER  07/2008   Complete heart block status post DDD with good function   INTRAVASCULAR IMAGING/OCT N/A 12/24/2021   Procedure: INTRAVASCULAR IMAGING/OCT;  Surgeon: Troy Sine, MD;  Location: [facility];  Service: Cardiovascular;  Laterality: N/A;   INTRAVASCULAR LITHOTRIPSY  02/09/2022   Procedure: INTRAVASCULAR LITHOTRIPSY;  Surgeon: Nelva Bush, MD;  Location: [place];  Service: Cardiovascular;;  LAD   INTRAVASCULAR ULTRASOUND/IVUS N/A 02/09/2022   Procedure: Intravascular Ultrasound/IVUS;  Surgeon: Nelva Bush, MD;  Location: [facility];  Service: Cardiovascular;  Laterality: N/A;  LAD   IR FLUORO GUIDE CV LINE RIGHT  04/03/2019   IR US GUIDE VASC ACCESS RIGHT  04/03/2019   LAPAROTOMY N/A 03/27/2017   Procedure: EXPLORATORY LAPAROTOMY;  Surgeon: Ileana Roup, MD;  Location: [facility];  Service: General;  Laterality: N/A;   RIGHT HEART CATH AND CORONARY ANGIOGRAPHY N/A 02/09/2022   Procedure: RIGHT HEART  CATH AND CORONARY ANGIOGRAPHY;  Surgeon: Nelva Bush, MD;  Location: [facility];  Service: Cardiovascular;  Laterality: N/A;   TONSILLECTOMY     UPPER GI ENDOSCOPY      Family Hx:  Family History  Problem Relation Age of Onset   Stroke Mother    Leukemia Father    Stroke Sister    Heart attack Brother     Social History:  reports that he quit smoking about 14 years ago. His smoking use included cigarettes. He has a 81.00 pack-year smoking history. He has never been exposed to tobacco smoke. He has never used smokeless tobacco. He reports current alcohol use. He reports that he does not use drugs.  Allergies:  Allergies  Allergen Reactions   Bee Venom Anaphylaxis   Lyrica [Pregabalin] Other (See Comments)    hallucinations   Prednisone Other (See Comments)    hallucinations   Zocor [Simvastatin]  Nausea Only and Other (See Comments)    Headache with brand name only.  Can take the generic.    Medications: Prior to Admission medications   Medication Sig Start Date End Date Taking? Authorizing Provider  aspirin EC 81 MG tablet Take 81 mg by mouth daily.   Yes [provider]  atorvastatin (LIPITOR) 80 MG tablet Take 1 tablet by mouth daily. 11/14/21  Yes Margie Billet, PA-C  calcitRIOL (ROCALTROL) 0.25 MCG capsule Take 7 capsules (1.75 mcg total) by mouth every Monday, Wednesday, and Friday with hemodialysis. 01/26/22  Yes [name], Manjot M, MD  carbamide peroxide (DEBROX) 6.5 % OTIC solution Place 5 drops into both ears 2 (two) times daily as needed (earwax). 11/13/21  Yes Lelon Perla, MD  Carboxymethylcellul-Glycerin (LUBRICATING EYE DROPS OP) Place 1 drop into both eyes as needed (dry eyes).   Yes [provider]  clopidogrel (PLAVIX) 75 MG tablet Take 1 tablet (75 mg total) by mouth daily with breakfast. 11/14/21  Yes Margie Billet, PA-C  Evolocumab (REPATHA SURECLICK) 953 MG/ML SOAJ Inject 140 mg into the skin every 14 (fourteen) days.  04/22/22  Yes [name], Hyde M, MD  gabapentin (NEURONTIN) 300 MG capsule Take 1 capsule (300 mg total) by mouth 2 (two) times daily. 03/16/22  Yes Lelon Perla, MD  midodrine (PROAMATINE) 10 MG tablet Take 0.5-1 tablets (5-10 mg total) by mouth See admin instructions. Take 1 tablet (10 mg) three times a week. Add 5 mg a night after dialysis as needed for low blood pressure. 04/02/22  Yes Aline August, MD  multivitamin (RENA-VIT) TABS tablet Take 1 tablet by mouth daily. 08/22/21  Yes [name], MD  nitroGLYCERIN (NITROSTAT) 0.4 MG SL tablet Place 1 tablet under the tongue every 5 minutes x 3 doses as needed for chest pain. 11/13/21  Yes Margie Billet, PA-C  oxymetazoline (AFRIN) 0.05 % nasal spray Place 1 spray into both nostrils 2 (two) times daily as needed for congestion. 12/29/21  Yes [name], MD  pantoprazole (PROTONIX) 40 MG tablet Take 1 tablet by mouth daily. 11/14/21  Yes Margie Billet, PA-C  saccharomyces boulardii (FLORASTOR) 250 MG capsule Take 1 capsule (250 mg total) by mouth 2 (two) times daily. 04/08/19  Yes Regalado, Belkys A, MD  sevelamer carbonate (RENVELA) 800 MG tablet Take 2 tablets (1,600 mg total) by mouth 3 (three) times daily with meals. Patient taking differently: Take 1,600 mg by mouth See admin instructions. Take 2 tablets by mouth three times daily with meals and 1 tablet with snacks 11/13/21  Yes Crenshaw, Denice Bors, MD  vitamin B-12 (CYANOCOBALAMIN) 1000 MCG tablet Take 1,000 mcg by mouth daily.   Yes [provider]  clonazePAM (KLONOPIN) 0.25 MG disintegrating tablet Take 1 tablet (0.25 mg total) by mouth 2 (two) times daily as needed for seizure. Patient not taking: Reported on 04/21/2022 04/02/22   Aline August, MD  glucose blood (FREESTYLE TEST STRIPS) test strip Use to check blood sugar daily 05/01/19   [name], MD  glucose monitoring kit (FREESTYLE) monitoring kit USE TO MONITOR BLOOD GLUCOSE AS DIRECTED 05/01/19    [name], MD  Methoxy PEG-Epoetin Beta (MIRCERA IJ) as directed. 02/26/21 07/15/22  [provider]    I have reviewed the patient's current and reported prior to admission medications.  Labs:     Latest Ref Rng & Units 05/06/2022    2:35 PM 04/01/2022    4:11 AM 03/31/2022  12:00 AM  BMP  Glucose 70 - 99 mg/dL 261  112  151   BUN 8 - 23 mg/dL 34  22  37   Creatinine 0.61 - 1.24 mg/dL 8.62  6.22  8.97   Sodium 135 - 145 mmol/L 143  139  140   Potassium 3.5 - 5.1 mmol/L 5.4  4.6  5.3   Chloride 98 - 111 mmol/L 100  95  100   CO2 22 - 32 mmol/L _0 Calcium 8.9 - 10.3 mg/dL 9.8  9.5  9.0     Urinalysis    Component Value Date/Time   COLORURINE YELLOW 04/07/2019 2001   APPEARANCEUR CLOUDY (A) 04/07/2019 2001   LABSPEC 1.015 04/07/2019 2001   PHURINE 5.0 04/07/2019 2001   GLUCOSEU 50 (A) 04/07/2019 2001   GLUCOSEU NEGATIVE 03/25/2017 1309   HGBUR MODERATE (A) 04/07/2019 2001   BILIRUBINUR NEGATIVE 04/07/2019 2001   BILIRUBINUR negative 04/04/2015 1229   BILIRUBINUR neg 02/27/2015 [place] 04/07/2019 2001   PROTEINUR >=300 (A) 04/07/2019 2001   UROBILINOGEN 0.2 03/25/2017 1309   NITRITE NEGATIVE 04/07/2019 2001   LEUKOCYTESUR LARGE (A) 04/07/2019 2001     ROS:  Pertinent items noted in HPI and remainder of comprehensive ROS otherwise negative.  Physical Exam: Vitals:   05/06/22 1600 05/06/22 1610  BP: (!) 137/57   Pulse: 80   Resp: 15   Temp:  98 F (36.7 C)  SpO2: 98%      General: elderly male in stretcher in NAD at rest HEENT: NCAT Eyes: EOMI sclera anicteric Neck: supple trachea midline  Heart: S1S2 no rub Lungs: clear and unlabored Abdomen: soft/nt/nd Extremities: 1-2+ edema lower extremities Skin: no rash on extremities exposed Neuro: alert and oriented x 3 provides hx and follows commands  Psych normal mood and affect  Assessment/Plan:  Chest pain with CAD and unstable angina - cardiology consulted and  per charting planning for cath tomorrow - gentle HD overnight to optimize  - will give midodrine prior to HD per prior regimen  ESRD  - HD tonight to optimize for cath tomorrow  Anemia of CKD  - Hb 10.4. no ESA in setting of unstable angina   Metabolic bone disease  - continue activated vit D if he remains hospitalized - binders when taking PO   Disposition per cardiology and primary team   Claudia Desanctis 05/06/2022, 8:21 PM

## 2022-05-06 NOTE — ED Notes (Signed)
Lab called with a critical troponin of 220

## 2022-05-06 NOTE — Hospital Course (Addendum)
88 y.o. M with ESRD on HD MWF, CHF status post AICD, and CHB status post CRT-D PPM, DM, HTN, OSA, obesity, ALS, chronic hypotension on midodrine, PAF, and coronary disease with 4 admissions this year for PCI admitted with working diagnosis of stable angina hypokalemia  Typically he has 1 episode of angina per day relieved with nitroglycerin but had 3 episodes so brought to the ED.  Chest pain had resolved in the ED.Of note  he only completed 1 hour of dialysis on Monday due to excruciating foot cramps In the ER, potassium 5.4, CBC at baseline, troponin 220, chest x-ray showed interstitial edema, ECG showed his normal paced rhythm.  He was given Milly Jakob, nephro cardiology is consulted and admitted. Patient felt to be having unusual angina versus NSTEMI-resume heparin drip echocardiogram done. 10/26 underwent cardiac cath-found to have another episode of restenosis in the ostial to proximal LAD successful balloon angioplasty, unfortunately no other options at our facility will need to look into availability of drug-coated balloon or brachytherapy as a possible treatment if this recurs.  Patient on aspirin 81, Plavix> changed to Brilinta, Zetia added to Lipitor for goal of LDL less than 50 and was given Coreg and Imdur. Plans for dialysis today after which patient will be discharged home

## 2022-05-06 NOTE — H&P (Addendum)
History and Physical    Patient: Rodney Farley HDQ:[id number] DOB: [birth year] DOA: 05/06/2022 DOS: the patient was seen and examined on 05/06/2022 PCP: [name], MD  Patient coming from: Home  Chief Complaint:  Chief Complaint  Patient presents with   Chest Pain       HPI:  Rodney Farley is an 88 y.o. M with ESRD on HD MWF, CHF status post AICD, and CHB status post CRT-D PPM, DM, HTN, OSA, obesity, ALS, chronic hypotension on midodrine, PAF, and coronary disease with 4 admissions this year for PCI who presented with chest pain.  Patient typically has about 1 episode of angina per day relieved with nitroglycerin.  Today he had 3, so on the 30 called EMS.  Thankfully since arrival to the ER his chest pain is resolved.  Note the patient only completed 1 hour of dialysis on Monday due to excruciating foot cramps and obviously has not had dialysis today.  In the ER, potassium 5.4, CBC at baseline, troponin 220, chest x-ray showed interstitial edema, ECG showed his normal paced rhythm.  He was given [facility] and the hospitalist service were asked to evaluate      Review of Systems  Constitutional:  Negative for chills and fever.  Respiratory:  Negative for cough, sputum production and shortness of breath.   Cardiovascular:  Positive for chest pain. Negative for palpitations.  Gastrointestinal:  Negative for abdominal pain.  All other systems reviewed and are negative.    Past Medical History:  Diagnosis Date   AICD (automatic cardioverter/defibrillator) present    [employer] pacer   Anemia    Anxiety and depression    Arthritis    Bell's palsy    left side. after shingles episode   BPH associated with nocturia    Chronic systolic CHF (congestive heart failure) ([place])    EF normalized by Echo 2019   COPD (chronic obstructive pulmonary disease) ([place])    Severe   Coronary artery disease    a. s/p MI in 1994/1995 while in [place] s/p questionable PCI. 03/2015:  progression of disease, for staged PCI.   Diabetic peripheral neuropathy (HCC)    GERD (gastroesophageal reflux disease)    Gout    Hard of hearing    B/L   History of chronic pancreatitis 07/23/2017   noted on CT abd/pelvis   History of shingles    Hypercholesterolemia    Hypertension    Obesity    Sleep apnea    "sleeps w/humidifyer when he panics and gets short of breath" (04/08/2015)   TIA (transient ischemic attack) X 3   Trigger middle finger of left hand    Type II diabetes mellitus ([place])    Wears glasses    Wears hearing aid    Past Surgical History:  Procedure Laterality Date   APPENDECTOMY     AV FISTULA PLACEMENT Right 02/07/2019   Procedure: ARTERIOVENOUS (AV) FISTULA CREATION RIGHT UPPER ARM;  Surgeon: Waynetta Sandy, MD;  Location: [place];  Service: Vascular;  Laterality: Right;   BIV ICD GENERATOR CHANGEOUT N/A 10/09/2021   Procedure: BIV ICD GENERATOR CHANGEOUT;  Surgeon: Deboraha Sprang, MD;  Location: [place] [facility];  Service: Cardiovascular;  Laterality: N/A;   CARDIAC CATHETERIZATION N/A 03/29/2015   Procedure: Right/Left Heart Cath and Coronary Angiography;  Surgeon: Lanson M [name], MD;  Location: [place] [facility];  Service: Cardiovascular;  Laterality: N/A;   [place]   "after my MI; put me  on heart RX after cath"   CARDIAC CATHETERIZATION N/A 04/09/2015   Procedure: Coronary Stent Intervention;  Surgeon: Lamarius M [name], MD;  Location: [place] [facility];  Service: Cardiovascular;  Laterality: N/A;   CATARACT EXTRACTION W/ INTRAOCULAR LENS  IMPLANT, BILATERAL     CHOLECYSTECTOMY N/A 10/13/2017   Procedure: LAPAROSCOPIC CHOLECYSTECTOMY WITH LYSIS OF ADHESIONS;  Surgeon: Ileana Roup, MD;  Location: [facility];  Service: General;  Laterality: N/A;   COLONOSCOPY     CORONARY ANGIOGRAPHY N/A 11/12/2021   Procedure: CORONARY ANGIOGRAPHY;  Surgeon: Wellington Hampshire, MD;  Location: [place] [facility];  Service: Cardiovascular;   Laterality: N/A;   CORONARY ANGIOGRAPHY N/A 12/24/2021   Procedure: CORONARY ANGIOGRAPHY;  Surgeon: Troy Sine, MD;  Location: [place] [facility];  Service: Cardiovascular;  Laterality: N/A;   CORONARY ANGIOGRAPHY N/A 03/31/2022   Procedure: CORONARY ANGIOGRAPHY;  Surgeon: Jettie Booze, MD;  Location: [place] [facility];  Service: Cardiovascular;  Laterality: N/A;   CORONARY BALLOON ANGIOPLASTY N/A 12/24/2021   Procedure: CORONARY BALLOON ANGIOPLASTY;  Surgeon: Troy Sine, MD;  Location: [place] [facility];  Service: Cardiovascular;  Laterality: N/A;   CORONARY BALLOON ANGIOPLASTY N/A 02/09/2022   Procedure: CORONARY BALLOON ANGIOPLASTY;  Surgeon: Nelva Bush, MD;  Location: [place] [facility];  Service: Cardiovascular;  Laterality: N/A;  [facility]   CORONARY BALLOON ANGIOPLASTY N/A 03/31/2022   Procedure: CORONARY BALLOON ANGIOPLASTY;  Surgeon: Jettie Booze, MD;  Location: [place] [facility];  Service: Cardiovascular;  Laterality: N/A;   CORONARY STENT INTERVENTION N/A 11/12/2021   Procedure: CORONARY STENT INTERVENTION;  Surgeon: Wellington Hampshire, MD;  Location: [place] [facility];  Service: Cardiovascular;  Laterality: N/A;  LAD   DENTAL SURGERY     EP IMPLANTABLE DEVICE N/A 09/23/2015   MDT CRT-D, Dr. Caryl Comes   HIATAL HERNIA REPAIR  1977   ILEOCECETOMY N/A 03/27/2017   Procedure: ILEOCECECTOMY;  Surgeon: Ileana Roup, MD;  Location: [place];  Service: General;  Laterality: N/A;   INSERT / REPLACE / REMOVE PACEMAKER  07/2008   Complete heart block status post DDD with good function   INTRAVASCULAR IMAGING/OCT N/A 12/24/2021   Procedure: INTRAVASCULAR IMAGING/OCT;  Surgeon: Troy Sine, MD;  Location: [place] [facility];  Service: Cardiovascular;  Laterality: N/A;   INTRAVASCULAR LITHOTRIPSY  02/09/2022   Procedure: INTRAVASCULAR LITHOTRIPSY;  Surgeon: Nelva Bush, MD;  Location: [place] [facility];  Service: Cardiovascular;;  LAD   INTRAVASCULAR ULTRASOUND/IVUS  N/A 02/09/2022   Procedure: Intravascular Ultrasound/IVUS;  Surgeon: Nelva Bush, MD;  Location: [place] [facility];  Service: Cardiovascular;  Laterality: N/A;  [facility]AD   IR FLUORO GUIDE CV LINE RIGHT  04/03/2019   IR US GUIDE VASC ACCESS RIGHT  04/03/2019   LAPAROTOMY N/A 03/27/2017   Procedure: EXPLORATORY LAPAROTOMY;  Surgeon: Ileana Roup, MD;  Location: [place];  Service: General;  Laterality: N/A;   RIGHT HEART CATH AND CORONARY ANGIOGRAPHY N/A 02/09/2022   Procedure: RIGHT HEART CATH AND CORONARY ANGIOGRAPHY;  Surgeon: Nelva Bush, MD;  Location: [place] [facility];  Service: Cardiovascular;  Laterality: N/A;   TONSILLECTOMY     UPPER GI ENDOSCOPY     Social History:  reports that he quit smoking about 14 years ago. His smoking use included cigarettes. He has a 81.00 pack-year smoking history. He has never been exposed to tobacco smoke. He has never used smokeless tobacco. He reports current alcohol use. He reports that he does  not use drugs.  Allergies  Allergen Reactions   Bee Venom Anaphylaxis   Lyrica [Pregabalin] Other (See Comments)    hallucinations   Prednisone Other (See Comments)    hallucinations   Zocor [Simvastatin] Nausea Only and Other (See Comments)    Headache with brand name only.  Can take the generic.    Family History  Problem Relation Age of Onset   Stroke Mother    Leukemia Father    Stroke Sister    Heart attack Brother     Prior to Admission medications   Medication Sig Start Date End Date Taking? Authorizing Provider  aspirin EC 81 MG tablet Take 81 mg by mouth daily.    [provider]  atorvastatin (LIPITOR) 80 MG tablet Take 1 tablet by mouth daily. 11/14/21   Margie Billet, PA-C  calcitRIOL (ROCALTROL) 0.25 MCG capsule Take 7 capsules (1.75 mcg total) by mouth every Monday, Wednesday, and Friday with hemodialysis. Patient not taking: Reported on 04/21/2022 01/26/22   [name], Nivek M, MD  carbamide peroxide (DEBROX) 6.5 %  OTIC solution Place 5 drops into both ears 2 (two) times daily as needed (earwax). 11/13/21   Lelon Perla, MD  Carboxymethylcellul-Glycerin (LUBRICATING EYE DROPS OP) Place 1 drop into both eyes 3 (three) times daily as needed (dry eyes).    [provider]  clonazePAM (KLONOPIN) 0.25 MG disintegrating tablet Take 1 tablet (0.25 mg total) by mouth 2 (two) times daily as needed for seizure. Patient not taking: Reported on 04/21/2022 04/02/22   Aline August, MD  clopidogrel (PLAVIX) 75 MG tablet Take 1 tablet (75 mg total) by mouth daily with breakfast. 11/14/21   Margie Billet, PA-C  Evolocumab (REPATHA SURECLICK) 400 MG/ML SOAJ Inject 140 mg into the skin every 14 (fourteen) days. 04/22/22   [name], Ezzard M, MD  gabapentin (NEURONTIN) 300 MG capsule Take 1 capsule (300 mg total) by mouth 2 (two) times daily. 03/16/22   Lelon Perla, MD  glucose blood (FREESTYLE TEST STRIPS) test strip Use to check blood sugar daily 05/01/19   [name], MD  glucose monitoring kit (FREESTYLE) monitoring kit USE TO MONITOR BLOOD GLUCOSE AS DIRECTED 05/01/19   [name], MD  Methoxy PEG-Epoetin Beta (MIRCERA IJ) as directed. 02/26/21 07/15/22  [provider]  midodrine (PROAMATINE) 10 MG tablet Take 0.5-1 tablets (5-10 mg total) by mouth See admin instructions. Take 1 tablet (10 mg) three times a week. Add 5 mg a night after dialysis as needed for low blood pressure. 04/02/22   Aline August, MD  multivitamin (RENA-VIT) TABS tablet Take 1 tablet by mouth daily. 08/22/21   [name], MD  nitroGLYCERIN (NITROSTAT) 0.4 MG SL tablet Place 1 tablet under the tongue every 5 minutes x 3 doses as needed for chest pain. 11/13/21   Margie Billet, PA-C  oxymetazoline (AFRIN) 0.05 % nasal spray Place 1 spray into both nostrils 2 (two) times daily as needed for congestion. 12/29/21   [name], MD  pantoprazole (PROTONIX) 40 MG tablet Take 1 tablet by mouth daily. 11/14/21    Margie Billet, PA-C  saccharomyces boulardii (FLORASTOR) 250 MG capsule Take 1 capsule (250 mg total) by mouth 2 (two) times daily. 04/08/19   Regalado, Belkys A, MD  sevelamer carbonate (RENVELA) 800 MG tablet Take 2 tablets (1,600 mg total) by mouth 3 (three) times daily with meals. Patient taking differently: Take 1,600 mg by mouth See admin instructions. Take 2 tablets  by mouth three times daily with meals and 1 tablet with snacks 11/13/21   Lelon Perla, MD  vitamin B-12 (CYANOCOBALAMIN) 1000 MCG tablet Take 1,000 mcg by mouth daily.    [provider]    Physical Exam: Vitals:   05/06/22 1515 05/06/22 1530 05/06/22 1600 05/06/22 1610  BP: 125/62 (!) 124/57 (!) 137/57   Pulse: 80 79 80   Resp: '14 19 15   ' Temp:    98 F (36.7 C)  TempSrc:    Oral  SpO2: 94% 95% 98%    Overweight adult male, lying in bed, interactive and appropriate RRR, soft systolic murmur, trace pitting lower extremity edema, no JVD Respiratory rate normal, lungs clear without rales or wheezes Abdomen soft tenderness palpation or guarding, no ascites or distention Attention normal, affect pleasant, judgment and insight appear normal Face with chronic left facial droop, speech fluent, extraocular movements intact, moves upper extremities with generalized weakness, limited by joint arthritis, but otherwise normal    Data Reviewed: Cardiology note reviewed Basic metabolic panel notable for elevated creatinine, elevated potassium LFTs normal CBC shows hemoglobin at baseline, macrocytic, unchanged from previous Troponin 220 Chest x-ray shows edema EKG shows paced rhythm    Assessment and Plan: Unstable angina Coronary artery disease - Continue home aspirin, atorvastatin, Plavix - Start heparin gtt - Consult cardiology, plan for left heart catheterization during this hospital stay   End-stage renal disease -Consult nephrology for routine dialysis, plan for tomorrow AM  Chronic  systolic CHF AICD in place Complete heart block status post CRT PPM Some edema on chest x-ray, missed dialysis today and Monday.  No symptoms of dyspnea or orthopnea today.  Minimal swelling. - Volume status per nephrology  Obesity BMI 32  OSA - CPAP at night  Aortic stenosis  Chronic hypotension - Continue home midodrine   Paroxysmal atrial fibrillation Noted on prior device check.  Burden low, Cardiology have previously recommended against anticoagulation.  Anemia of chronic kidney disease Hgb at baseline           Advance Care Planning: Partial code, as per MOST form  Consults: Cardiology, Dr. Claiborne Billings Nephrology, Dr. Royce Macadamia  Family Communication: None present  Severity of Illness: The appropriate patient status for this patient is OBSERVATION. Observation status is judged to be reasonable and necessary in order to provide the required intensity of service to ensure the patient's safety. [name] presenting symptoms, physical exam findings, and initial radiographic and laboratory data in the context of their medical condition is felt to place them at decreased risk for further clinical deterioration. Furthermore, it is anticipated that the patient will be medically stable for discharge from the hospital within 2 midnights of admission.   Author: Edwin Dada, MD 05/06/2022 6:51 PM  For on call review [email or web address].

## 2022-05-06 NOTE — Consult Note (Addendum)
Cardiology Consultation   Patient ID: Rodney Farley MRN: [id number]; DOB: 1932-05-17  Admit date: 05/06/2022 Date of Consult: 05/06/2022  PCP:  [name], [facility] Cardiologist:  Rayfield [name], MD        Patient Profile:   Rodney Farley is a 88 y.o. male with a hx of CAD with PCI of LAD, second diagonal and OM 3 in 2016.  Most recently with in-stent restenosis of ostial LAD lesion.  CRT-D. Also with ESRD on dialysis.  Being seen 05/06/2022 for the evaluation of angina at the request of Rodney Belling, MD.  History of Present Illness:   Rodney Farley presents to the emergency department by EMS for chest pain.  This pain has been increasing in frequency since his last catheterization in September 2023.  He has 4-5 episodes of angina per week.  Today, he woke up with angina around 6 AM.  He had another episode around 10:30 AM that was associated with nausea and vomiting.  Finally he had another episode around noon.  The pain is described as centrally located with a spreading sensation down his left arm.  It is rated as 1/10.  Responds well to nitroglycerin.  Due to the increasing frequency of his chest pain he called EMS.  Medical history notable for end-stage renal disease on dialysis, MWF.  He missed his session today.  Past Medical History:  Diagnosis Date   AICD (automatic cardioverter/defibrillator) present    Medtronic pacer   Anemia    Anxiety and depression    Arthritis    Bell's palsy    left side. after shingles episode   BPH associated with nocturia    Chronic systolic CHF (congestive heart failure) ([place])    EF normalized by Echo 2019   COPD (chronic obstructive pulmonary disease) ([place])    Severe   Coronary artery disease    a. s/p MI in 1994/1995 while in [place] s/p questionable PCI. 03/2015: progression of disease, for staged PCI.   Diabetic peripheral neuropathy (HCC)    GERD (gastroesophageal reflux disease)     Gout    Hard of hearing    B/L   History of chronic pancreatitis 07/23/2017   noted on CT abd/pelvis   History of shingles    Hypercholesterolemia    Hypertension    Obesity    Sleep apnea    "sleeps w/humidifyer when he panics and gets short of breath" (04/08/2015)   TIA (transient ischemic attack) X 3   Trigger middle finger of left hand    Type II diabetes mellitus ([place])    Wears glasses    Wears hearing aid     Past Surgical History:  Procedure Laterality Date   APPENDECTOMY     AV FISTULA PLACEMENT Right 02/07/2019   Procedure: ARTERIOVENOUS (AV) FISTULA CREATION RIGHT UPPER ARM;  Surgeon: Waynetta Sandy, MD;  Location: [place];  Service: Vascular;  Laterality: Right;   BIV ICD GENERATOR CHANGEOUT N/A 10/09/2021   Procedure: BIV ICD GENERATOR CHANGEOUT;  Surgeon: Deboraha Sprang, MD;  Location: [facility];  Service: Cardiovascular;  Laterality: N/A;   CARDIAC CATHETERIZATION N/A 03/29/2015   Procedure: Right/Left Heart Cath and Coronary Angiography;  Surgeon: Jayzen M [name], MD;  Location: [facility] [facility];  Service: Cardiovascular;  Laterality: N/A;   [place]   "after my MI; put me on heart RX after cath"   CARDIAC CATHETERIZATION N/A 04/09/2015  Procedure: Coronary Stent Intervention;  Surgeon: Damaria M [name], MD;  Location: [place] [facility];  Service: Cardiovascular;  Laterality: N/A;   CATARACT EXTRACTION W/ INTRAOCULAR LENS  IMPLANT, BILATERAL     CHOLECYSTECTOMY N/A 10/13/2017   Procedure: LAPAROSCOPIC CHOLECYSTECTOMY WITH LYSIS OF ADHESIONS;  Surgeon: Ileana Roup, MD;  Location: [facility];  Service: General;  Laterality: N/A;   COLONOSCOPY     CORONARY ANGIOGRAPHY N/A 11/12/2021   Procedure: CORONARY ANGIOGRAPHY;  Surgeon: Wellington Hampshire, MD;  Location: [place] [facility];  Service: Cardiovascular;  Laterality: N/A;   CORONARY ANGIOGRAPHY N/A 12/24/2021   Procedure: CORONARY ANGIOGRAPHY;  Surgeon: Rodney Sine, MD;   Location: [place] [facility];  Service: Cardiovascular;  Laterality: N/A;   CORONARY ANGIOGRAPHY N/A 03/31/2022   Procedure: CORONARY ANGIOGRAPHY;  Surgeon: Jettie Booze, MD;  Location: [place] [facility];  Service: Cardiovascular;  Laterality: N/A;   CORONARY BALLOON ANGIOPLASTY N/A 12/24/2021   Procedure: CORONARY BALLOON ANGIOPLASTY;  Surgeon: Rodney Sine, MD;  Location: [place] [facility];  Service: Cardiovascular;  Laterality: N/A;   CORONARY BALLOON ANGIOPLASTY N/A 02/09/2022   Procedure: CORONARY BALLOON ANGIOPLASTY;  Surgeon: Nelva Bush, MD;  Location: [place] [facility];  Service: Cardiovascular;  Laterality: N/A;  LAD   CORONARY BALLOON ANGIOPLASTY N/A 03/31/2022   Procedure: CORONARY BALLOON ANGIOPLASTY;  Surgeon: Jettie Booze, MD;  Location: [place] [facility];  Service: Cardiovascular;  Laterality: N/A;   CORONARY STENT INTERVENTION N/A 11/12/2021   Procedure: CORONARY STENT INTERVENTION;  Surgeon: Wellington Hampshire, MD;  Location: [place] [facility];  Service: Cardiovascular;  Laterality: N/A;  LAD   DENTAL SURGERY     EP IMPLANTABLE DEVICE N/A 09/23/2015   MDT CRT-D, Dr. Caryl Comes   HIATAL HERNIA REPAIR  1977   ILEOCECETOMY N/A 03/27/2017   Procedure: ILEOCECECTOMY;  Surgeon: Ileana Roup, MD;  Location: [place];  Service: General;  Laterality: N/A;   INSERT / REPLACE / REMOVE PACEMAKER  07/2008   Complete heart block status post DDD with good function   INTRAVASCULAR IMAGING/OCT N/A 12/24/2021   Procedure: INTRAVASCULAR IMAGING/OCT;  Surgeon: Rodney Sine, MD;  Location: [place] [facility];  Service: Cardiovascular;  Laterality: N/A;   INTRAVASCULAR LITHOTRIPSY  02/09/2022   Procedure: INTRAVASCULAR LITHOTRIPSY;  Surgeon: Nelva Bush, MD;  Location: [place] [facility];  Service: Cardiovascular;;  LAD   INTRAVASCULAR ULTRASOUND/IVUS N/A 02/09/2022   Procedure: Intravascular Ultrasound/IVUS;  Surgeon: Nelva Bush, MD;  Location: [facility];   Service: Cardiovascular;  Laterality: N/A;  LAD   IR FLUORO GUIDE CV LINE RIGHT  04/03/2019   IR US GUIDE VASC ACCESS RIGHT  04/03/2019   LAPAROTOMY N/A 03/27/2017   Procedure: EXPLORATORY LAPAROTOMY;  Surgeon: Ileana Roup, MD;  Location: [place];  Service: General;  Laterality: N/A;   RIGHT HEART CATH AND CORONARY ANGIOGRAPHY N/A 02/09/2022   Procedure: RIGHT HEART CATH AND CORONARY ANGIOGRAPHY;  Surgeon: Nelva Bush, MD;  Location: [place] [facility];  Service: Cardiovascular;  Laterality: N/A;   TONSILLECTOMY     UPPER GI ENDOSCOPY       Home Medications:  Prior to Admission medications   Medication Sig Start Date End Date Taking? Authorizing Provider  aspirin EC 81 MG tablet Take 81 mg by mouth daily.    [provider]  atorvastatin (LIPITOR) 80 MG tablet Take 1 tablet by mouth daily. 11/14/21   Margie Billet, PA-C  calcitRIOL (ROCALTROL) 0.25  MCG capsule Take 7 capsules (1.75 mcg total) by mouth every Monday, Wednesday, and Friday with hemodialysis. Patient not taking: Reported on 04/21/2022 01/26/22   [name], Oluwadamilare M, MD  carbamide peroxide (DEBROX) 6.5 % OTIC solution Place 5 drops into both ears 2 (two) times daily as needed (earwax). 11/13/21   Lelon Perla, MD  Carboxymethylcellul-Glycerin (LUBRICATING EYE DROPS OP) Place 1 drop into both eyes 3 (three) times daily as needed (dry eyes).    [provider]  clonazePAM (KLONOPIN) 0.25 MG disintegrating tablet Take 1 tablet (0.25 mg total) by mouth 2 (two) times daily as needed for seizure. Patient not taking: Reported on 04/21/2022 04/02/22   Aline August, MD  clopidogrel (PLAVIX) 75 MG tablet Take 1 tablet (75 mg total) by mouth daily with breakfast. 11/14/21   Margie Billet, PA-C  Evolocumab (REPATHA SURECLICK) 793 MG/ML SOAJ Inject 140 mg into the skin every 14 (fourteen) days. 04/22/22   [name], Estevan M, MD  gabapentin (NEURONTIN) 300 MG capsule Take 1 capsule (300 mg total) by mouth 2 (two)  times daily. 03/16/22   Lelon Perla, MD  glucose blood (FREESTYLE TEST STRIPS) test strip Use to check blood sugar daily 05/01/19   [name], MD  glucose monitoring kit (FREESTYLE) monitoring kit USE TO MONITOR BLOOD GLUCOSE AS DIRECTED 05/01/19   [name], MD  Methoxy PEG-Epoetin Beta (MIRCERA IJ) as directed. 02/26/21 07/15/22  [provider]  midodrine (PROAMATINE) 10 MG tablet Take 0.5-1 tablets (5-10 mg total) by mouth See admin instructions. Take 1 tablet (10 mg) three times a week. Add 5 mg a night after dialysis as needed for low blood pressure. 04/02/22   Aline August, MD  multivitamin (RENA-VIT) TABS tablet Take 1 tablet by mouth daily. 08/22/21   [name], MD  nitroGLYCERIN (NITROSTAT) 0.4 MG SL tablet Place 1 tablet under the tongue every 5 minutes x 3 doses as needed for chest pain. 11/13/21   Margie Billet, PA-C  oxymetazoline (AFRIN) 0.05 % nasal spray Place 1 spray into both nostrils 2 (two) times daily as needed for congestion. 12/29/21   [name], MD  pantoprazole (PROTONIX) 40 MG tablet Take 1 tablet by mouth daily. 11/14/21   Margie Billet, PA-C  saccharomyces boulardii (FLORASTOR) 250 MG capsule Take 1 capsule (250 mg total) by mouth 2 (two) times daily. 04/08/19   Regalado, Belkys A, MD  sevelamer carbonate (RENVELA) 800 MG tablet Take 2 tablets (1,600 mg total) by mouth 3 (three) times daily with meals. Patient taking differently: Take 1,600 mg by mouth See admin instructions. Take 2 tablets by mouth three times daily with meals and 1 tablet with snacks 11/13/21   Lelon Perla, MD  vitamin B-12 (CYANOCOBALAMIN) 1000 MCG tablet Take 1,000 mcg by mouth daily.    [provider]    Inpatient Medications: Scheduled Meds:  Continuous Infusions:  PRN Meds:   Allergies:    Allergies  Allergen Reactions   Bee Venom Anaphylaxis   Lyrica [Pregabalin] Other (See Comments)    hallucinations   Prednisone Other  (See Comments)    hallucinations   Zocor [Simvastatin] Nausea Only and Other (See Comments)    Headache with brand name only.  Can take the generic.    Social History:   Social History   Socioeconomic History   Marital status: Legally Separated    Spouse name: Not on file   Number of children: 1   Years of education:  college   Highest education level: Not on file  Occupational History   Occupation: Retired  Tobacco Use   Smoking status: Former    Packs/day: 1.50    Years: 54.00    Total pack years: 81.00    Types: Cigarettes    Quit date: 07/18/2007    Years since quitting: 14.8    Passive exposure: Never   Smokeless tobacco: Never  Vaping Use   Vaping Use: Never used  Substance and Sexual Activity   Alcohol use: Yes    Comment: rare   Drug use: No   Sexual activity: Not Currently  Other Topics Concern   Not on file  Social History Narrative   Married 1994 (together since 1989) 1 son, 1 stepson. 3 grandkids, 6 great grandkids      Retired from [employer]. Had 2 years of collge.       Faith: Mormon      Here on [employer] since 2004 from [place];    Social Determinants of Health   Financial Resource Strain: Not on file  Food Insecurity: Not on file  Transportation Needs: Not on file  Physical Activity: Not on file  Stress: Not on file  Social Connections: Not on file  Intimate Partner Violence: Not on file    Family History:    Family History  Problem Relation Age of Onset   Stroke Mother    Leukemia Father    Stroke Sister    Heart attack Brother      ROS:  Please see the history of present illness.  Review of Systems  Cardiovascular:  Positive for leg swelling.    All other ROS reviewed and negative.     Physical Exam/Data:   Vitals:   05/06/22 1515 05/06/22 1530 05/06/22 1600 05/06/22 1610  BP: 125/62 (!) 124/57 (!) 137/57   Pulse: 80 79 80   Resp: '14 19 15   ' Temp:    98 F (36.7 C)  TempSrc:    Oral  SpO2: 94% 95% 98%     No intake or output data in the 24 hours ending 05/06/22 1821    04/21/2022    2:12 PM 04/02/2022    4:03 AM 03/31/2022   10:10 PM  Last 3 Weights  Weight (lbs) 206 lb 9.6 oz 212 lb 11.9 oz 219 lb 9.3 oz  Weight (kg) 93.713 kg 96.5 kg 99.6 kg     There is no height or weight on file to calculate BMI.  General:  Well nourished, well developed, in no acute distress HEENT: normal Neck: JVD present Vascular: No carotid bruits; Distal pulse 2+ left radial Cardiac:  normal S1, S2; RRR; 3/6 systolic murmur Lungs:  clear to auscultation bilaterally, no wheezing, rhonchi or rales  Abd: soft, nontender, no hepatomegaly  Ext: no edema Musculoskeletal:  No deformities, BUE and BLE strength normal and equal Skin: warm and dry  Neuro:  No gross focal deficits Psych:  Normal affect   EKG:  The EKG was personally reviewed and demonstrates:  Paced rhythm without evidence of ST elevation or depression. Telemetry:  Telemetry was personally reviewed and demonstrates:  paced rhythm.  Relevant CV Studies:   Ost RCA to Prox RCA lesion is 70% stenosed.   Prox Cx lesion is 30% stenosed.   Mid Cx to Dist Cx lesion is 40% stenosed.   Mid Cx lesion is 30% stenosed.   2nd Diag-1 lesion is 30% stenosed.   3rd Mrg lesion is 50% stenosed.  Non-stenotic 2nd Diag-2 lesion was previously treated.   Dist LM to Ost LAD lesion is 90% stenosed. In stent restenosis.   Scoring balloon angioplasty was performed using a BALLN WOLVERINE 3.50X10, followed by PTCA with 4.0 Smyer balloon.   Post intervention, there is a 20% residual stenosis.    RHC: Right Heart Pressures RA (mean): 14 mmHg RV (S/EDP): 57/15 mmHg PA (S/D, mean): 57/30 (39) mmHg PCWP (mean): 33 mmHg  Ao sat: 92% PA sat: 72%  Fick CO: 10.6 L/min Fick CI: 5.0 L/min/m^2  Thermodilution CO: 6.5 L/min Thermodilution CI: 3.1 L/min/m^2   Echo:  1. Left ventricular ejection fraction, by estimation, is 45 to 50%. The  left ventricle has mildly  decreased function. The left ventricle  demonstrates regional wall motion abnormalities (see scoring  diagram/findings for description). The left ventricular   internal cavity size was mildly dilated. There is mild left ventricular  hypertrophy. Left ventricular diastolic parameters are consistent with  Grade I diastolic dysfunction (impaired relaxation).   2. Right ventricular systolic function is normal. The right ventricular  size is normal. Tricuspid regurgitation signal is inadequate for assessing  PA pressure.   3. Left atrial size was moderately dilated.   4. The mitral valve is degenerative. Trivial mitral valve regurgitation.  No evidence of mitral stenosis.   5. The aortic valve was not well visualized. Aortic valve regurgitation  is not visualized. Moderate aortic valve stenosis. Vmax 2.9 m/s, MG  66mHg, AVA 1.2 cm^2, DI 0.38  Laboratory Data:  High Sensitivity Troponin:   Recent Labs  Lab 05/06/22 1435  TROPONINIHS 220*     Chemistry Recent Labs  Lab 05/06/22 1435  NA 143  K 5.4*  CL 100  CO2 29  GLUCOSE 261*  BUN 34*  CREATININE 8.62*  CALCIUM 9.8  GFRNONAA 5*  ANIONGAP 14    Recent Labs  Lab 05/06/22 1435  PROT 6.4*  ALBUMIN 3.5  AST 19  ALT 22  ALKPHOS 95  BILITOT 0.3   Lipids No results for input(s): "CHOL", "TRIG", "HDL", "LABVLDL", "LDLCALC", "CHOLHDL" in the last 168 hours.  Hematology Recent Labs  Lab 05/06/22 1435  WBC 8.2  RBC 3.03*  HGB 10.4*  HCT 33.1*  MCV 109.2*  MCH 34.3*  MCHC 31.4  RDW 15.8*  PLT 137*   Thyroid No results for input(s): "TSH", "FREET4" in the last 168 hours.  BNPNo results for input(s): "BNP", "PROBNP" in the last 168 hours.  DDimer No results for input(s): "DDIMER" in the last 168 hours.   Radiology/Studies:  DG Chest Portable 1 View  Result Date: 05/06/2022 CLINICAL DATA:  Shortness of breath which is worsening EXAM: PORTABLE CHEST 1 VIEW COMPARISON:  03/30/2022 FINDINGS: Chronic cardiomegaly.  Pacemaker/AICD appears grossly unchanged. Pulmonary venous hypertension, possibly with mild interstitial edema. No pleural effusion. No focal consolidation. IMPRESSION: Chronic cardiomegaly. Pulmonary venous hypertension, possibly with mild interstitial edema. Electronically Signed   By: MNelson ChimesM.D.   On: 05/06/2022 14:03     Assessment and Plan:   Principal Problem:   Chest pain Active Problems:   Obesity (BMI 30-39.9)   Pacemaker   Sleep apnea   Type II diabetes mellitus ([place]   ESRD on hemodialysis (HCC)   COPD (chronic obstructive pulmonary disease) (HCC)   Hyperlipidemia   Hyperkalemia, diminished renal excretion   Anemia in chronic kidney disease   ESRD on dialysis ([facility]   Atrial fibrillation (HCC) - SCAF   Aortic stenosis   Hypotension  Unstable angina History  suggestive of accelerating angina in this elderly gentleman with multi-vessel CAD established in-stent restenosis. EKG without acute changes to suggest STEMI but HS troponin is elevated to 220. He appears mildly hypervolemic on exam, likely multifactorial due to depressed EF in setting of systolic HF with likely ongoing ischemic cardiac event and a missed hemodialysis session today. Primary culprit is the ostial LAD lesion that has been stented and then subsequently dilated via angioplasty twice, most recently in September of 2023. Will plan for cath tomorrow but will need to coordinate with dialysis. -Admit to medicine for co-management with hospitalist service -Schedule for cath tomorrow -Echo -Continue ASA/Plavix, atorvastatin  Ischemic cardiomyopathy Chronic systolic CHF NYHA III Volume status managed via dialysis. Carvedilol held in outpatient setting due to hypotension. No ACEi/ARB due to ESRD.  ESRD Under-dialyzed this week due to interruption by episode of CP today. Likely contributing to overall hypervolemic picture. -Nephrology for dialysis   Risk Assessment/Risk Scores:     TIMI Risk Score for  Unstable Angina or Non-ST Elevation MI:   [name] TIMI risk score is 6, which indicates a 41% risk of all cause mortality, new or recurrent myocardial infarction or need for urgent revascularization in the next 14 days.  New York Heart Association (NYHA) Functional Class NYHA Class III        For questions or updates, please contact [place] Please consult [email or web address] for contact info under    Signed, Nani Gasser, MD  05/06/2022 6:21 PM   Patient seen and examined. Agree with assessment and plan.  Rodney Farley is a very pleasant 88 year old [place] gentleman who has known CAD and end-stage renal disease on dialysis.  He suffered an MI while in [place] in 1990 10/1993 and in 2016 had undergone PCI to his LAD/second diagonal/OM 3 vessel.  He has a history of complete heart block status post permanent pacemaker insertion in 2010 with CRT-D upgrade in 2017 and generator replacement in March 2023.  He has a history of ischemic cardiomyopathy, chronic systolic heart failure, moderate aortic stenosis, hypertension, hyperlipidemia, type 2 diabetes mellitus, OSA, and remote TIA.  He suffered a non-STEMI in May 2023 and underwent insertion of a new ostial 3.0 x 24 mm DES stent to the LAD proximal to the previously placed stent.  He developed recurrent chest pain leading to hospitalization in June 14 and underwent successful intervention with ultimate titration of noncompliant balloons to 4.0 x 15 mm.  He again developed restenosis in July 2023 and most recently had repeat intervention by Dr. Illene Bolus to his LAD ostium in-stent restenosis in September 2023.  The patient admits that he has had recurrent chest pain symptomatology at least for the last 3 or 4 weeks following his most recent intervention.  He has developed increasing chest pain leading to hospitalization today.  The patient is on dialysis on Monday Wednesday and Friday and did not have dialysis today.  He had  undergone a echo Doppler study in May 2023 which showed EF 45 to 50%, mild LVH, and moderate aortic stenosis with estimated valve area 1.2 cm.  His recurrent chest pain today is very similar to his prior episodes and is nitrate responsive.  On exam, blood pressure is 130/60 pulse around 80.  HEENT is unremarkable.  There is no JVD.  Lungs are relatively clear.  Rhythm is regular with a 8-1/1 systolic murmur in the aortic region consistent with aortic stenosis.  Abdomen is soft and nontender.  Pulses are adequate.  There is  trace pretibial edema above his sock line.  Neurologic exam is nonfocal.  Initial high-sensitivity troponin is mildly increased at 220.  Potassium 5.4, BUN 34 creatinine 8.62.  CBC 10.4 hematocrit 33.1 marked macrocytosis with MCV 109.  Chest x-ray shows chronic cardiomegaly with pulmonary venous hypertension with possible mild interstitial edema.  Patient's symptoms are highly suggestive of recurrent ostial LAD restenosis in his previously placed stent.  Patient missed dialysis today.  We will need to have nephrology evaluate for for timing of dialysis.  The patient should undergo repeat cardiac catheterization and will need to decide timing based on need for dialysis.   Rodney Sine, MD, [facility] 05/06/2022 6:33 PM

## 2022-05-06 NOTE — Telephone Encounter (Signed)
Please advise on verbal orders.

## 2022-05-06 NOTE — Progress Notes (Signed)
Received report from ED nurse and asked about heparin drip being started. Called HD next and they stated he will be back on the unit soon and they can not have drips there. We will start heparin drip as soon as pt arrives.

## 2022-05-06 NOTE — ED Triage Notes (Signed)
Pt BIB [facility] from home with CP increasing since stent placement 5 months ago. Significant cardiac hx. Pt taking nitro every day, took 3 today total, which fixed cp, but caused weakness and vomiting. A&Ox4. Patient missed dialysis today d/t weakness and CP.

## 2022-05-06 NOTE — Progress Notes (Signed)
ANTICOAGULATION CONSULT NOTE - Initial Consult  Pharmacy Consult for heparin Indication: chest pain/ACS  Allergies  Allergen Reactions   Bee Venom Anaphylaxis   Lyrica [Pregabalin] Other (See Comments)    hallucinations   Prednisone Other (See Comments)    hallucinations   Zocor [Simvastatin] Nausea Only and Other (See Comments)    Headache with brand name only.  Can take the generic.    Patient Measurements: Height: 5\' 7"  (170.2 cm) Weight: 93.7 kg (206 lb 9.1 oz) IBW/kg (Calculated) : 66.1 Heparin Dosing Weight: 85.9kg  Vital Signs: Temp: 98 F (36.7 C) (10/25 1610) Temp Source: Oral (10/25 1610) BP: 137/57 (10/25 1600) Pulse Rate: 80 (10/25 1600)  Labs: Recent Labs    05/06/22 1435 05/06/22 1746  HGB 10.4*  --   HCT 33.1*  --   PLT 137*  --   CREATININE 8.62*  --   TROPONINIHS 220* 275*    Estimated Creatinine Clearance: 6.5 mL/min (A) (by C-G formula based on SCr of 8.62 mg/dL (H)).   Medical History: Past Medical History:  Diagnosis Date   AICD (automatic cardioverter/defibrillator) present    Medtronic pacer   Anemia    Anxiety and depression    Arthritis    Bell's palsy    left side. after shingles episode   BPH associated with nocturia    Chronic systolic CHF (congestive heart failure) ([place])    EF normalized by Echo 2019   COPD (chronic obstructive pulmonary disease) ([place])    Severe   Coronary artery disease    a. s/p MI in 1994/1995 while in [place] s/p questionable PCI. 03/2015: progression of disease, for staged PCI.   Diabetic peripheral neuropathy (HCC)    GERD (gastroesophageal reflux disease)    Gout    Hard of hearing    B/L   History of chronic pancreatitis 07/23/2017   noted on CT abd/pelvis   History of shingles    Hypercholesterolemia    Hypertension    Obesity    Sleep apnea    "sleeps w/humidifyer when he panics and gets short of breath" (04/08/2015)   TIA (transient ischemic attack) X 3   Trigger middle finger of left  hand    Type II diabetes mellitus ([place])    Wears glasses    Wears hearing aid     Assessment: 88yo M presenting with chest pain. Pt has PMH of afib but is not on anticoagulation PTA. Pharmacy consulted for heparin dosing for ACS.   Goal of Therapy:  Heparin level 0.3-0.7 units/ml Monitor platelets by anticoagulation protocol: Yes   Plan:  Give 4000 units bolus x 1 Start heparin infusion at 1100 units/hr Check anti-Xa level in 8 hours and daily while on heparin Continue to monitor H&H and platelets  Titus Dubin, PharmD PGY1 Pharmacy Resident 05/06/2022 7:23 PM

## 2022-05-06 NOTE — Telephone Encounter (Signed)
Verbal orders LMOVM

## 2022-05-06 NOTE — ED Provider Notes (Signed)
[facility] Provider Note   CSN: [id number] Arrival date & time: 05/06/22  1324     History  Chief Complaint  Patient presents with   Chest Pain    Rodney Farley is a 88 y.o. male.   Chest Pain Patient presents with chest pain.  Anterior chest.  States it is his angina and does not feel like a heart attack.  Has had multiple heart attacks and has had daily angina.  States he normally takes about 1 nitroglycerin a day but has had to take 3 today.  Also had only half dialysis on Monday with today being Wednesday and did not have any dialysis today.  Feeling weak.  Also had a little bit of confusion.  No fevers or chills.  No cough.    Past Medical History:  Diagnosis Date   AICD (automatic cardioverter/defibrillator) present    Medtronic pacer   Anemia    Anxiety and depression    Arthritis    Bell's palsy    left side. after shingles episode   BPH associated with nocturia    Chronic systolic CHF (congestive heart failure) ([place])    EF normalized by Echo 2019   COPD (chronic obstructive pulmonary disease) ([place])    Severe   Coronary artery disease    a. s/p MI in 1994/1995 while in [place] s/p questionable PCI. 03/2015: progression of disease, for staged PCI.   Diabetic peripheral neuropathy (HCC)    GERD (gastroesophageal reflux disease)    Gout    Hard of hearing    B/L   History of chronic pancreatitis 07/23/2017   noted on CT abd/pelvis   History of shingles    Hypercholesterolemia    Hypertension    Obesity    Sleep apnea    "sleeps w/humidifyer when he panics and gets short of breath" (04/08/2015)   TIA (transient ischemic attack) X 3   Trigger middle finger of left hand    Type II diabetes mellitus ([place])    Wears glasses    Wears hearing aid     Home Medications Prior to Admission medications   Medication Sig Start Date End Date Taking? Authorizing Provider  aspirin EC 81 MG tablet Take 81 mg by mouth  daily.    [provider]  atorvastatin (LIPITOR) 80 MG tablet Take 1 tablet by mouth daily. 11/14/21   Margie Billet, PA-C  calcitRIOL (ROCALTROL) 0.25 MCG capsule Take 7 capsules (1.75 mcg total) by mouth every Monday, Wednesday, and Friday with hemodialysis. Patient not taking: Reported on 04/21/2022 01/26/22   [name], Tadashi M, MD  carbamide peroxide (DEBROX) 6.5 % OTIC solution Place 5 drops into both ears 2 (two) times daily as needed (earwax). 11/13/21   Lelon Perla, MD  Carboxymethylcellul-Glycerin (LUBRICATING EYE DROPS OP) Place 1 drop into both eyes 3 (three) times daily as needed (dry eyes).    [provider]  clonazePAM (KLONOPIN) 0.25 MG disintegrating tablet Take 1 tablet (0.25 mg total) by mouth 2 (two) times daily as needed for seizure. Patient not taking: Reported on 04/21/2022 04/02/22   Aline August, MD  clopidogrel (PLAVIX) 75 MG tablet Take 1 tablet (75 mg total) by mouth daily with breakfast. 11/14/21   Margie Billet, PA-C  Evolocumab (REPATHA SURECLICK) 989 MG/ML SOAJ Inject 140 mg into the skin every 14 (fourteen) days. 04/22/22   [name], Moody M, MD  gabapentin (NEURONTIN) 300 MG capsule Take 1 capsule (300 mg total)  by mouth 2 (two) times daily. 03/16/22   Lelon Perla, MD  glucose blood (FREESTYLE TEST STRIPS) test strip Use to check blood sugar daily 05/01/19   [name], MD  glucose monitoring kit (FREESTYLE) monitoring kit USE TO MONITOR BLOOD GLUCOSE AS DIRECTED 05/01/19   [name], MD  Methoxy PEG-Epoetin Beta (MIRCERA IJ) as directed. 02/26/21 07/15/22  [provider]  midodrine (PROAMATINE) 10 MG tablet Take 0.5-1 tablets (5-10 mg total) by mouth See admin instructions. Take 1 tablet (10 mg) three times a week. Add 5 mg a night after dialysis as needed for low blood pressure. 04/02/22   Aline August, MD  multivitamin (RENA-VIT) TABS tablet Take 1 tablet by mouth daily. 08/22/21   [name], MD   nitroGLYCERIN (NITROSTAT) 0.4 MG SL tablet Place 1 tablet under the tongue every 5 minutes x 3 doses as needed for chest pain. 11/13/21   Margie Billet, PA-C  oxymetazoline (AFRIN) 0.05 % nasal spray Place 1 spray into both nostrils 2 (two) times daily as needed for congestion. 12/29/21   [name], MD  pantoprazole (PROTONIX) 40 MG tablet Take 1 tablet by mouth daily. 11/14/21   Margie Billet, PA-C  saccharomyces boulardii (FLORASTOR) 250 MG capsule Take 1 capsule (250 mg total) by mouth 2 (two) times daily. 04/08/19   Regalado, Belkys A, MD  sevelamer carbonate (RENVELA) 800 MG tablet Take 2 tablets (1,600 mg total) by mouth 3 (three) times daily with meals. Patient taking differently: Take 1,600 mg by mouth See admin instructions. Take 2 tablets by mouth three times daily with meals and 1 tablet with snacks 11/13/21   Lelon Perla, MD  vitamin B-12 (CYANOCOBALAMIN) 1000 MCG tablet Take 1,000 mcg by mouth daily.    [provider]      Allergies    Bee venom, Lyrica [pregabalin], Prednisone, and Zocor [simvastatin]    Review of Systems   Review of Systems  Cardiovascular:  Positive for chest pain.    Physical Exam Updated Vital Signs BP 125/62   Pulse 80   Resp 14   SpO2 94%  Physical Exam Vitals reviewed.  Cardiovascular:     Rate and Rhythm: Normal rate and regular rhythm.  Pulmonary:     Breath sounds: No wheezing, rhonchi or rales.  Chest:     Chest wall: No tenderness.  Abdominal:     Tenderness: There is no abdominal tenderness.  Neurological:     Mental Status: He is alert.     ED Results / Procedures / Treatments   Labs (all labs ordered are listed, but only abnormal results are displayed) Labs Reviewed  CBC WITH DIFFERENTIAL/PLATELET - Abnormal; Notable for the following components:      Result Value   RBC 3.03 (*)    Hemoglobin 10.4 (*)    HCT 33.1 (*)    MCV 109.2 (*)    MCH 34.3 (*)    RDW 15.8 (*)    Platelets 137 (*)     All other components within normal limits  COMPREHENSIVE METABOLIC PANEL  TROPONIN I (HIGH SENSITIVITY)  TROPONIN I (HIGH SENSITIVITY)    EKG EKG Interpretation  Date/Time:  Wednesday May 06 2022 14:08:54 EDT Ventricular Rate:  80 PR Interval:    QRS Duration: 172 QT Interval:  459 QTC Calculation: 530 R Axis:   241 Text Interpretation: AV dual-paced rhythm Confirmed by Davonna Belling [phone]) on 05/06/2022 2:14:18 PM  Radiology DG Chest Portable 1  View  Result Date: 05/06/2022 CLINICAL DATA:  Shortness of breath which is worsening EXAM: PORTABLE CHEST 1 VIEW COMPARISON:  03/30/2022 FINDINGS: Chronic cardiomegaly. Pacemaker/AICD appears grossly unchanged. Pulmonary venous hypertension, possibly with mild interstitial edema. No pleural effusion. No focal consolidation. IMPRESSION: Chronic cardiomegaly. Pulmonary venous hypertension, possibly with mild interstitial edema. Electronically Signed   By: Nelson Chimes M.D.   On: 05/06/2022 14:03    Procedures Procedures    Medications Ordered in ED Medications - No data to display  ED Course/ Medical Decision Making/ A&P                           Medical Decision Making Amount and/or Complexity of Data Reviewed Labs: ordered. Radiology: ordered.   Patient with chest pain.  Anterior chest.  Differential diagnosis is long but does include coronary artery disease, ACS.  Pneumonia, pneumothorax.  History of chronic angina but has been worsening today requiring more nitroglycerin.  Does improve with the nitroglycerin.  Also has only half dialysis on Monday and has not had dialysis yet today on Wednesday.  We will likely require dialysis sooner rather than later.  We will get basic blood work chest x-ray and EKG.  Will likely require admission to hospital. Discussed with cardiology, who will see patient.        Final Clinical Impression(s) / ED Diagnoses Final diagnoses:  Angina pectoris ([place])  End stage renal disease on  dialysis [facility])    Rx / DC Orders ED Discharge Orders     None         Davonna Belling, MD 05/06/22 1533

## 2022-05-06 NOTE — Telephone Encounter (Signed)
I have not been given these forms yet

## 2022-05-06 NOTE — Telephone Encounter (Signed)
Yes thanks may provide verbal orders

## 2022-05-06 NOTE — ED Provider Notes (Signed)
Patient signed out to me at 1500 by Dr. Alvino Chapel pending labs with plan for likely admission.  In short this is an 88 year old male with a history of CAD and ESRD presenting to the emergency department with chest pain.  Patient states that he has needed nitro most days for his chest pain but today needed nitro 3 times.  He states he was given a total of 324 mg of aspirin by EMS and states that his chest pain has been resolved since noon today.  Upon my evaluation, the patient is awake and alert resting in bed comfortably in no acute distress.  Chest x-ray is without acute disease, EKG without acute ischemic changes but troponin is elevated to 220 compared to the patient's normal around 50s.  Cardiology has been consulted and will evaluate the patient for further recommendations.  Plan is for admission to hospitalist.  Nephrology will be consulted for dialysis.   Leanord Asal K, DO 05/06/22 [phone]

## 2022-05-07 ENCOUNTER — Observation Stay (HOSPITAL_COMMUNITY): Payer: PPO

## 2022-05-07 ENCOUNTER — Other Ambulatory Visit: Payer: Self-pay

## 2022-05-07 ENCOUNTER — Encounter (HOSPITAL_COMMUNITY)
Admission: EM | Disposition: A | Discharge status: Home Health | Payer: Self-pay | Source: Home / Self Care | Attending: Internal Medicine

## 2022-05-07 DIAGNOSIS — Z950 Presence of cardiac pacemaker: Secondary | ICD-10-CM | POA: Diagnosis not present

## 2022-05-07 DIAGNOSIS — I350 Nonrheumatic aortic (valve) stenosis: Secondary | ICD-10-CM | POA: Diagnosis not present

## 2022-05-07 DIAGNOSIS — R011 Cardiac murmur, unspecified: Secondary | ICD-10-CM

## 2022-05-07 DIAGNOSIS — I25119 Atherosclerotic heart disease of native coronary artery with unspecified angina pectoris: Secondary | ICD-10-CM

## 2022-05-07 DIAGNOSIS — I200 Unstable angina: Secondary | ICD-10-CM | POA: Diagnosis not present

## 2022-05-07 DIAGNOSIS — R079 Chest pain, unspecified: Secondary | ICD-10-CM

## 2022-05-07 DIAGNOSIS — N186 End stage renal disease: Secondary | ICD-10-CM | POA: Diagnosis not present

## 2022-05-07 HISTORY — PX: CORONARY BALLOON ANGIOPLASTY: CATH118233

## 2022-05-07 HISTORY — PX: CORONARY ANGIOGRAPHY: CATH118303

## 2022-05-07 LAB — GLUCOSE, CAPILLARY
Glucose-Capillary: 101 mg/dL — ABNORMAL HIGH (ref 70–99)
Glucose-Capillary: 106 mg/dL — ABNORMAL HIGH (ref 70–99)
Glucose-Capillary: 109 mg/dL — ABNORMAL HIGH (ref 70–99)
Glucose-Capillary: 97 mg/dL (ref 70–99)
Glucose-Capillary: 147 mg/dL — ABNORMAL HIGH (ref 70–99)

## 2022-05-07 LAB — POCT ACTIVATED CLOTTING TIME
Activated Clotting Time: 268 seconds
Activated Clotting Time: 323 seconds
Activated Clotting Time: 209 seconds
Activated Clotting Time: 179 seconds

## 2022-05-07 LAB — ECHOCARDIOGRAM COMPLETE
AR max vel: 1.46 cm2
AV Area VTI: 1.42 cm2
AV Area mean vel: 1.39 cm2
AV Mean grad: 20 mmHg
AV Peak grad: 35.2 mmHg
Ao pk vel: 2.97 m/s
Area-P 1/2: 3.85 cm2
Calc EF: 24.2 %
Height: 67 in
S' Lateral: 4.2 cm
Single Plane A2C EF: 28.1 %
Single Plane A4C EF: 27.4 %
Weight: 3294.4 oz

## 2022-05-07 LAB — HEPARIN LEVEL (UNFRACTIONATED): Heparin Unfractionated: 0.14 IU/mL — ABNORMAL LOW (ref 0.30–0.70)

## 2022-05-07 LAB — BASIC METABOLIC PANEL
Anion gap: 11 (ref 5–15)
BUN: 15 mg/dL (ref 8–23)
CO2: 30 mmol/L (ref 22–32)
Calcium: 8.8 mg/dL — ABNORMAL LOW (ref 8.9–10.3)
Chloride: 96 mmol/L — ABNORMAL LOW (ref 98–111)
Creatinine, Ser: 5.32 mg/dL — ABNORMAL HIGH (ref 0.61–1.24)
GFR, Estimated: 10 mL/min — ABNORMAL LOW (ref >60)
Glucose, Bld: 108 mg/dL — ABNORMAL HIGH (ref 70–99)
Potassium: 3.8 mmol/L (ref 3.5–5.1)
Sodium: 137 mmol/L (ref 135–145)

## 2022-05-07 LAB — CBC
HCT: 32.7 % — ABNORMAL LOW (ref 39.0–52.0)
Hemoglobin: 10.9 g/dL — ABNORMAL LOW (ref 13.0–17.0)
MCH: 34.9 pg — ABNORMAL HIGH (ref 26.0–34.0)
MCHC: 33.3 g/dL (ref 30.0–36.0)
MCV: 104.8 fL — ABNORMAL HIGH (ref 80.0–100.0)
Platelets: 128 10*3/uL — ABNORMAL LOW (ref 150–400)
RBC: 3.12 MIL/uL — ABNORMAL LOW (ref 4.22–5.81)
RDW: 15.6 % — ABNORMAL HIGH (ref 11.5–15.5)
WBC: 8.4 10*3/uL (ref 4.0–10.5)
nRBC: 0 % (ref 0.0–0.2)

## 2022-05-07 LAB — MRSA NEXT GEN BY PCR, NASAL: MRSA by PCR Next Gen: NOT DETECTED

## 2022-05-07 LAB — HEPATITIS B SURFACE ANTIGEN: Hepatitis B Surface Ag: NONREACTIVE

## 2022-05-07 LAB — TROPONIN I (HIGH SENSITIVITY)
Troponin I (High Sensitivity): 1037 ng/L (ref <18)
Troponin I (High Sensitivity): 962 ng/L (ref <18)

## 2022-05-07 SURGERY — CORONARY BALLOON ANGIOPLASTY
Anesthesia: LOCAL

## 2022-05-07 MED ORDER — SEVELAMER CARBONATE 800 MG PO TABS
1600.0000 mg | ORAL_TABLET | Freq: Three times a day (TID) | ORAL | Status: DC
  Administered 2022-05-08: 1600 mg via ORAL
  Filled 2022-05-07 (×2): qty 2

## 2022-05-07 MED ORDER — HEPARIN SODIUM (PORCINE) 1000 UNIT/ML IJ SOLN
INTRAMUSCULAR | Status: AC
  Filled 2022-05-07: qty 10

## 2022-05-07 MED ORDER — PERFLUTREN LIPID MICROSPHERE
1.0000 mL | INTRAVENOUS | Status: AC | PRN
  Administered 2022-05-07: 2 mL via INTRAVENOUS

## 2022-05-07 MED ORDER — HEPARIN SODIUM (PORCINE) 1000 UNIT/ML IJ SOLN
INTRAMUSCULAR | Status: DC | PRN
  Administered 2022-05-07: 9000 [IU] via INTRAVENOUS
  Administered 2022-05-07: 1000 [IU] via INTRAVENOUS

## 2022-05-07 MED ORDER — ASPIRIN 81 MG PO CHEW
81.0000 mg | CHEWABLE_TABLET | Freq: Every day | ORAL | Status: DC
  Administered 2022-05-08: 81 mg via ORAL
  Filled 2022-05-07: qty 1

## 2022-05-07 MED ORDER — LIDOCAINE HCL (PF) 1 % IJ SOLN
INTRAMUSCULAR | Status: AC
  Filled 2022-05-07: qty 30

## 2022-05-07 MED ORDER — HYDRALAZINE HCL 20 MG/ML IJ SOLN: 10.0000 mg | INTRAMUSCULAR | Status: AC | PRN

## 2022-05-07 MED ORDER — ASPIRIN 81 MG PO TBEC
81.0000 mg | DELAYED_RELEASE_TABLET | Freq: Every day | ORAL | Status: DC
  Filled 2022-05-07: qty 1

## 2022-05-07 MED ORDER — SODIUM CHLORIDE 0.9% FLUSH: 3.0000 mL | INTRAVENOUS | Status: DC | PRN

## 2022-05-07 MED ORDER — GABAPENTIN 300 MG PO CAPS
300.0000 mg | ORAL_CAPSULE | Freq: Two times a day (BID) | ORAL | Status: DC
  Administered 2022-05-07 – 2022-05-08 (×4): 300 mg via ORAL
  Filled 2022-05-07 (×4): qty 1

## 2022-05-07 MED ORDER — IOHEXOL 350 MG/ML SOLN
INTRAVENOUS | Status: DC | PRN
  Administered 2022-05-07: 70 mL

## 2022-05-07 MED ORDER — ENOXAPARIN SODIUM 30 MG/0.3ML IJ SOSY: 30.0000 mg | PREFILLED_SYRINGE | INTRAMUSCULAR | Status: DC

## 2022-05-07 MED ORDER — SODIUM CHLORIDE 0.9% FLUSH
3.0000 mL | Freq: Two times a day (BID) | INTRAVENOUS | Status: DC
  Administered 2022-05-08: 3 mL via INTRAVENOUS

## 2022-05-07 MED ORDER — ACETAMINOPHEN 325 MG PO TABS: 650.0000 mg | ORAL_TABLET | ORAL | Status: DC | PRN

## 2022-05-07 MED ORDER — FENTANYL CITRATE (PF) 100 MCG/2ML IJ SOLN
INTRAMUSCULAR | Status: DC | PRN
  Administered 2022-05-07: 25 ug via INTRAVENOUS

## 2022-05-07 MED ORDER — ASPIRIN 81 MG PO CHEW
81.0000 mg | CHEWABLE_TABLET | ORAL | Status: AC
  Administered 2022-05-07: 81 mg via ORAL
  Filled 2022-05-07: qty 1

## 2022-05-07 MED ORDER — MIDAZOLAM HCL 2 MG/2ML IJ SOLN
INTRAMUSCULAR | Status: AC
  Filled 2022-05-07: qty 2

## 2022-05-07 MED ORDER — MIDAZOLAM HCL 2 MG/2ML IJ SOLN
INTRAMUSCULAR | Status: DC | PRN
  Administered 2022-05-07: 1 mg via INTRAVENOUS

## 2022-05-07 MED ORDER — CLOPIDOGREL BISULFATE 75 MG PO TABS
75.0000 mg | ORAL_TABLET | Freq: Every day | ORAL | Status: DC
  Administered 2022-05-07: 75 mg via ORAL
  Filled 2022-05-07: qty 1

## 2022-05-07 MED ORDER — HEPARIN (PORCINE) IN NACL 1000-0.9 UT/500ML-% IV SOLN
INTRAVENOUS | Status: AC
  Filled 2022-05-07: qty 1000

## 2022-05-07 MED ORDER — ONDANSETRON HCL 4 MG/2ML IJ SOLN: 4.0000 mg | Freq: Four times a day (QID) | INTRAMUSCULAR | Status: DC | PRN

## 2022-05-07 MED ORDER — ASPIRIN 81 MG PO CHEW: 81.0000 mg | CHEWABLE_TABLET | ORAL | Status: DC

## 2022-05-07 MED ORDER — POLYVINYL ALCOHOL 1.4 % OP SOLN
2.0000 [drp] | OPHTHALMIC | Status: DC | PRN
  Filled 2022-05-07: qty 15

## 2022-05-07 MED ORDER — INSULIN ASPART 100 UNIT/ML IJ SOLN: 0.0000 [IU] | Freq: Every day | INTRAMUSCULAR | Status: DC

## 2022-05-07 MED ORDER — SODIUM CHLORIDE 0.9 % IV SOLN: INTRAVENOUS | Status: DC

## 2022-05-07 MED ORDER — PANTOPRAZOLE SODIUM 40 MG PO TBEC
40.0000 mg | DELAYED_RELEASE_TABLET | Freq: Every day | ORAL | Status: DC
  Administered 2022-05-07 – 2022-05-08 (×2): 40 mg via ORAL
  Filled 2022-05-07 (×2): qty 1

## 2022-05-07 MED ORDER — HEPARIN (PORCINE) IN NACL 1000-0.9 UT/500ML-% IV SOLN
INTRAVENOUS | Status: DC | PRN
  Administered 2022-05-07 (×2): 500 mL

## 2022-05-07 MED ORDER — LIDOCAINE HCL (PF) 1 % IJ SOLN
INTRAMUSCULAR | Status: DC | PRN
  Administered 2022-05-07: 15 mL

## 2022-05-07 MED ORDER — CLOPIDOGREL BISULFATE 75 MG PO TABS
75.0000 mg | ORAL_TABLET | Freq: Every day | ORAL | Status: DC
  Filled 2022-05-07: qty 1

## 2022-05-07 MED ORDER — SODIUM CHLORIDE 0.9 % IV SOLN: 250.0000 mL | INTRAVENOUS | Status: DC | PRN

## 2022-05-07 MED ORDER — CHLORHEXIDINE GLUCONATE CLOTH 2 % EX PADS
6.0000 | MEDICATED_PAD | Freq: Every day | CUTANEOUS | Status: DC
  Administered 2022-05-08: 6 via TOPICAL

## 2022-05-07 MED ORDER — ATORVASTATIN CALCIUM 80 MG PO TABS
80.0000 mg | ORAL_TABLET | Freq: Every day | ORAL | Status: DC
  Administered 2022-05-07 – 2022-05-08 (×2): 80 mg via ORAL
  Filled 2022-05-07 (×2): qty 1

## 2022-05-07 MED ORDER — FENTANYL CITRATE (PF) 100 MCG/2ML IJ SOLN
INTRAMUSCULAR | Status: AC
  Filled 2022-05-07: qty 2

## 2022-05-07 MED ORDER — INSULIN ASPART 100 UNIT/ML IJ SOLN: 0.0000 [IU] | Freq: Three times a day (TID) | INTRAMUSCULAR | Status: DC

## 2022-05-07 MED ORDER — LABETALOL HCL 5 MG/ML IV SOLN: 10.0000 mg | INTRAVENOUS | Status: AC | PRN

## 2022-05-07 SURGICAL SUPPLY — 20 items
BALL SAPPHIRE NC24 4.0X10 (BALLOONS) ×1
BALLN SCOREFLEX 3.50X10 (BALLOONS) ×1
BALLOON SAPPHIRE NC24 4.0X10 (BALLOONS) IMPLANT
BALLOON SCOREFLEX 3.50X10 (BALLOONS) IMPLANT
CATH INFINITI 5FR MULTPACK ANG (CATHETERS) IMPLANT
CATH LAUNCHER 6FR EBU 4 (CATHETERS) IMPLANT
KIT ENCORE 26 ADVANTAGE (KITS) IMPLANT
KIT HEART LEFT (KITS) ×1 IMPLANT
KIT HEMO VALVE WATCHDOG (MISCELLANEOUS) IMPLANT
PACK CARDIAC CATHETERIZATION (CUSTOM PROCEDURE TRAY) ×1 IMPLANT
PINNACLE LONG 6F 25CM (SHEATH) ×1
SHEATH INTRO PINNACLE 6F 25CM (SHEATH) IMPLANT
SHEATH PINNACLE 5F 10CM (SHEATH) IMPLANT
SHEATH PROBE COVER 6X72 (BAG) IMPLANT
TRANSDUCER W/STOPCOCK (MISCELLANEOUS) ×1 IMPLANT
TUBING CIL FLEX 10 FLL-RA (TUBING) ×1 IMPLANT
WIRE ASAHI PROWATER 180CM (WIRE) IMPLANT
WIRE EMERALD 3MM-J .035X150CM (WIRE) IMPLANT
WIRE EMERALD 3MM-J .035X260CM (WIRE) IMPLANT
WIRE RUNTHROUGH .014X180CM (WIRE) IMPLANT

## 2022-05-07 NOTE — Consult Note (Signed)
WOC Nurse Consult Note: Reason for Consult:right posterior foot wound, chronic, nonhealing Wound type:full thickness Pressure Injury POA: N/A Measurement: Per Nursing Flow Sheet: 2cm x 2cm x 0.2cm Wound bed:red, dry Drainage (amount, consistency, odor) none Periwound:intact Dressing procedure/placement/frequency:I have provided Nursing with guidance for the care of the right foot wound using a soap and water cleanse followed by placement of an antimicrobial nonadherent, xeroform, gauze. This will be topped with dry gauze and secured with a silicone foam topper dressing.  Recommend patient follow up with podiatry or his PCP for oversight of this wound post discharge. If you agree, please refer.  [facility] nursing team will not follow, but will remain available to this patient, the nursing and medical teams.  Please re-consult if needed.  Thank you for inviting [place] to participate in this patient's Plan of Care.  Maudie Flakes, MSN, RN, CNS, [place], Serita Grammes, [employer], [employer] phone:  [phone]

## 2022-05-07 NOTE — Progress Notes (Signed)
  Echocardiogram 2D Echocardiogram has been performed.  Rodney Farley 05/07/2022, 9:47 AM

## 2022-05-07 NOTE — Progress Notes (Signed)
   05/07/22 0030  Vitals  Temp 98.7 F (37.1 C)  BP (!) 151/70  MAP (mmHg) 88  Pulse Rate (!) 30  ECG Heart Rate 81  Resp 19  Post Treatment  Dialyzer Clearance Heavily streaked  Duration of HD Treatment -hour(s) 3.5 hour(s)  Liters Processed 84  Fluid Removed 1500 mL  Tolerated HD Treatment Yes  AVG/AVF Arterial Site Held (minutes) 10 minutes  AVG/AVF Venous Site Held (minutes) 10 minutes   TX fin w/ 1500 off UF, only due to feet cramping.

## 2022-05-07 NOTE — Progress Notes (Signed)
[place] for heparin Indication: chest pain/ACS  Allergies  Allergen Reactions   Bee Venom Anaphylaxis   Lyrica [Pregabalin] Other (See Comments)    hallucinations   Prednisone Other (See Comments)    hallucinations   Zocor [Simvastatin] Nausea Only and Other (See Comments)    Headache with brand name only.  Can take the generic.    Patient Measurements: Height: 5\' 7"  (170.2 cm) Weight: 93.4 kg (205 lb 14.4 oz) IBW/kg (Calculated) : 66.1 Heparin Dosing Weight: 85.9kg  Vital Signs: Temp: 98 F (36.7 C) (10/26 0819) Temp Source: Oral (10/26 0819) BP: 123/101 (10/26 1005) Pulse Rate: 79 (10/26 0819)  Labs: Recent Labs    05/06/22 1435 05/06/22 1746 05/07/22 0136 05/07/22 0744 05/07/22 1029  HGB 10.4*  --  10.9*  --   --   HCT 33.1*  --  32.7*  --   --   PLT 137*  --  128*  --   --   HEPARINUNFRC  --   --   --   --  0.14*  CREATININE 8.62*  --   --  5.32*  --   TROPONINIHS 220* 275*  --   --   --      Estimated Creatinine Clearance: 10.5 mL/min (A) (by C-G formula based on SCr of 5.32 mg/dL (H)).   Medical History: Past Medical History:  Diagnosis Date   AICD (automatic cardioverter/defibrillator) present    Medtronic pacer   Anemia    Anxiety and depression    Arthritis    Bell's palsy    left side. after shingles episode   BPH associated with nocturia    Chronic systolic CHF (congestive heart failure) ([place])    EF normalized by Echo 2019   COPD (chronic obstructive pulmonary disease) ([place])    Severe   Coronary artery disease    a. s/p MI in 1994/1995 while in [place] s/p questionable PCI. 03/2015: progression of disease, for staged PCI.   Diabetic peripheral neuropathy (HCC)    GERD (gastroesophageal reflux disease)    Gout    Hard of hearing    B/L   History of chronic pancreatitis 07/23/2017   noted on CT abd/pelvis   History of shingles    Hypercholesterolemia    Hypertension    Obesity    Sleep apnea     "sleeps w/humidifyer when he panics and gets short of breath" (04/08/2015)   TIA (transient ischemic attack) X 3   Trigger middle finger of left hand    Type II diabetes mellitus ([place])    Wears glasses    Wears hearing aid     Assessment: 88yo M presenting with chest pain. Pt has PMH of afib but is not on anticoagulation PTA. Pharmacy consulted for heparin dosing for ACS.   Heparin level low this morning at 0.14. CBC appears stable. No bleeding or IV issues noted.   Goal of Therapy:  Heparin level 0.3-0.7 units/ml Monitor platelets by anticoagulation protocol: Yes   Plan:  Increase heparin infusion to 1300 units/hr Check anti-Xa level in 8 hours vs follow up after cath Continue to monitor H&H and platelets  Erin Hearing PharmD., BCPS Clinical Pharmacist 05/07/2022 11:51 AM

## 2022-05-07 NOTE — H&P (View-Only) (Signed)
Rounding Note    Patient Name: Rodney Farley Date of Encounter: 05/07/2022  [facility] Cardiologist: Glade [name], MD   Subjective   Feels well this morning. No chest pain overnight. Underwent dialysis last night. Had leg cramping that was similar in nature to the cramping that caused him to end his session early on Monday. 1500 UF removed.  Inpatient Medications    Scheduled Meds:  aspirin EC  81 mg Oral Daily   atorvastatin  80 mg Oral Daily   carvedilol  3.125 mg Oral BID WC   Chlorhexidine Gluconate Cloth  6 each Topical Q0600   clopidogrel  75 mg Oral Q breakfast   gabapentin  300 mg Oral BID   insulin aspart  0-15 Units Subcutaneous TID WC   insulin aspart  0-5 Units Subcutaneous QHS   isosorbide mononitrate  30 mg Oral Daily   pantoprazole  40 mg Oral Daily   sevelamer carbonate  1,600 mg Oral TID WC   sodium chloride flush  3 mL Intravenous Q12H   sodium zirconium cyclosilicate  10 g Oral Once   Continuous Infusions:  sodium chloride     sodium chloride 10 mL/hr at 05/07/22 0641   heparin 1,100 Units/hr (05/07/22 0641)   PRN Meds: sodium chloride, sodium chloride flush   Vital Signs    Vitals:   05/07/22 0122 05/07/22 0126 05/07/22 0152 05/07/22 0409  BP:  106/76  (!) 122/49  Pulse:  71  76  Resp:  18  20  Temp:  98 F (36.7 C)  97.6 F (36.4 C)  TempSrc:  Oral  Oral  SpO2:  100% 100% 97%  Weight: 93.4 kg     Height: 5\' 7"  (1.702 m)       Intake/Output Summary (Last 24 hours) at 05/07/2022 0733 Last data filed at 05/07/2022 0641 Gross per 24 hour  Intake 128.76 ml  Output 1500 ml  Net -1371.24 ml      05/07/2022    1:22 AM 05/07/2022   12:30 AM 05/06/2022    8:22 PM  Last 3 Weights  Weight (lbs) 205 lb 14.4 oz 203 lb 4.2 oz 206 lb 9.1 oz  Weight (kg) 93.396 kg 92.2 kg 93.7 kg      Telemetry    Paced rhythm, irregular - Personally Reviewed  ECG    Pending study - Personally Reviewed  Physical Exam   GEN: No  acute distress.   Neck: No JVD Cardiac: 3/6 systolic murmur.  Respiratory: Slight crackles lung bases. GI: Soft, nontender, non-distended. +Bowel sounds. MS: Trace edema above sock line Neuro:  Nonfocal  Psych: Pleasant. Appropriate mood and affect.  Labs    High Sensitivity Troponin:   Recent Labs  Lab 05/06/22 1435 05/06/22 1746  TROPONINIHS 220* 275*     Chemistry Recent Labs  Lab 05/06/22 1435  NA 143  K 5.4*  CL 100  CO2 29  GLUCOSE 261*  BUN 34*  CREATININE 8.62*  CALCIUM 9.8  PROT 6.4*  ALBUMIN 3.5  AST 19  ALT 22  ALKPHOS 95  BILITOT 0.3  GFRNONAA 5*  ANIONGAP 14    Lipids No results for input(s): "CHOL", "TRIG", "HDL", "LABVLDL", "LDLCALC", "CHOLHDL" in the last 168 hours.  Hematology Recent Labs  Lab 05/06/22 1435 05/07/22 0136  WBC 8.2 8.4  RBC 3.03* 3.12*  HGB 10.4* 10.9*  HCT 33.1* 32.7*  MCV 109.2* 104.8*  MCH 34.3* 34.9*  MCHC 31.4 33.3  RDW 15.8* 15.6*  PLT  137* 128*   Thyroid No results for input(s): "TSH", "FREET4" in the last 168 hours.  BNPNo results for input(s): "BNP", "PROBNP" in the last 168 hours.  DDimer No results for input(s): "DDIMER" in the last 168 hours.   Radiology    DG Chest Portable 1 View  Result Date: 05/06/2022 CLINICAL DATA:  Shortness of breath which is worsening EXAM: PORTABLE CHEST 1 VIEW COMPARISON:  03/30/2022 FINDINGS: Chronic cardiomegaly. Pacemaker/AICD appears grossly unchanged. Pulmonary venous hypertension, possibly with mild interstitial edema. No pleural effusion. No focal consolidation. IMPRESSION: Chronic cardiomegaly. Pulmonary venous hypertension, possibly with mild interstitial edema. Electronically Signed   By: Nelson Chimes M.D.   On: 05/06/2022 14:03    Cardiac Studies  LHC (03/31/2022):   Ost RCA to Prox RCA lesion is 70% stenosed.   Prox Cx lesion is 30% stenosed.   Mid Cx to Dist Cx lesion is 40% stenosed.   Mid Cx lesion is 30% stenosed.   2nd Diag-1 lesion is 30% stenosed.   3rd  Mrg lesion is 50% stenosed.   Non-stenotic 2nd Diag-2 lesion was previously treated.   Dist LM to Ost LAD lesion is 90% stenosed. In stent restenosis.   Scoring balloon angioplasty was performed using a BALLN WOLVERINE 3.50X10, followed by PTCA with 4.0 Cole balloon.   Post intervention, there is a 20% residual stenosis.     RHC (02/09/2022): Right Heart Pressures RA (mean): 14 mmHg RV (S/EDP): 57/15 mmHg PA (S/D, mean): 57/30 (39) mmHg PCWP (mean): 33 mmHg  Ao sat: 92% PA sat: 72%  Fick CO: 10.6 L/min Fick CI: 5.0 L/min/m^2  Thermodilution CO: 6.5 L/min Thermodilution CI: 3.1 L/min/m^2    Echo (05/07/2022):  1. Left ventricular ejection fraction, by estimation, is 45 to 50%. The  left ventricle has mildly decreased function. The left ventricle  demonstrates regional wall motion abnormalities (see scoring  diagram/findings for description). Left ventricular  diastolic parameters are indeterminate.   2. Right ventricular systolic function is normal. The right ventricular  size is normal. There is normal pulmonary artery systolic pressure.   3. The mitral valve is normal in structure. Trivial mitral valve  regurgitation. No evidence of mitral stenosis. Moderate mitral annular  calcification.   4. The aortic valve is normal in structure. There is mild calcification  of the aortic valve. There is mild thickening of the aortic valve. Aortic  valve regurgitation is trivial. Moderate aortic valve stenosis. Aortic  valve area, by VTI measures 1.42  cm. Aortic valve mean gradient measures 20.0 mmHg. Aortic valve Vmax  measures 2.97 m/s.   5. The inferior vena cava is normal in size with greater than 50%  respiratory variability, suggesting right atrial pressure of 3 mmHg.     Patient Profile     88 y.o. male with history of HFrEF s/p CRT-D, ESRD on HD MWF, and multi-vessel CAD s/p PCI to LAD, 2nd diag, and OM 3 in 5093, complicated by several episodes of angina due to in-stent  restenosis of ostial LAD lesion, who presents with accelerated angina and elevated troponin concerning for UA versus NSTEMI.  Assessment & Plan   Principal Problem:   Chest pain Active Problems:   Obesity (BMI 30-39.9)   Pacemaker   Sleep apnea   Type II diabetes mellitus ([place])   ESRD on hemodialysis (HCC)   COPD (chronic obstructive pulmonary disease) (HCC)   Hyperlipidemia   Hyperkalemia, diminished renal excretion   Anemia in chronic kidney disease   ESRD on dialysis ([place])  Atrial fibrillation (HCC) - SCAF   Aortic stenosis   Hypotension  UA versus NSTEMI Condition has improved with no chest pain overnight. Patient has been chest pain free for approximately 18 hours at the time of my interview. Will repeat troponin and EKG today. Per today's echo, EF 45-50% w/ regional wall motion abnormalities similar to study from May 2023. Plan for [facility] today with Dr. Irish Lack at 1500. -Heparin gtt -Imdur -ASA/Plavix indefinitely -Carvedilol 3.125 mg BID -Follow up echo and EKG -LHC today  Ischemic cardiomyopathy Chronic systolic CHF NYHA III Without signs of significant volume overload today. CRT-D. Carvedilol as above. Appreciate nephrology assistance with dialysis for volume management.  Afib Subclinical atrial fibrillation noted on previous device interrogation, not on anticoagulation PTA given low Afib burden -Heparin gtt as above  ESRD on HD MWF Describes producing less than a tablespoon of urine per day, only enough to stain his underpants, consistent with anuric ESRD. -dialysis      For questions or updates, please contact [place] Please consult [email or web address] for contact info under        Signed, Nani Gasser, MD  05/07/2022, 7:33 AM     Patient seen and examined. Agree with assessment and plan.  Patient currently feels improved.  He underwent successful dialysis last night with removal of 1.5 kg-UF, with limited ultrafiltration due to cramping of  his legs.  Currently he is pain-free on current medical regimen.  Echo Doppler study today shows stable EF at 45 to 50% with moderate aortic stenosis with mean gradient of 20.  He is on carvedilol 3.125 mg twice a day, isosorbide 30 mg, has continued to be on aspirin/Plavix.  Atorvastatin is at 80 mg for hyperlipidemia with target less than 55.  He is scheduled to undergo repeat cardiac catheterization today with probable need for repeat intervention.  Symptom complex is worrisome for recurrent in-stent restenosis.  Patient is fully aware of the risk benefits of the procedure.  Question if candidate for possible brachytherapy at another center in the future.    Troy Sine, MD, [facility] 05/07/2022 11:36 AM

## 2022-05-07 NOTE — Progress Notes (Signed)
PROGRESS NOTE Rodney Farley  NKN:[id number] DOB: 02-17-1932 DOA: 05/06/2022 PCP: [name], MD   Brief Narrative/Hospital Course: 88 y.o. M with ESRD on HD MWF, CHF status post AICD, and CHB status post CRT-D PPM, DM, HTN, OSA, obesity, ALS, chronic hypotension on midodrine, PAF, and coronary disease with 4 admissions this year for PCI admitted with working diagnosis of stable angina hypokalemia   Typically he has 1 episode of angina per day relieved with nitroglycerin but had 3 episodes so brought to the ED.  Chest pain had resolved in the ED.Of note  he only completed 1 hour of dialysis on Monday due to excruciating foot cramps In the ER, potassium 5.4, CBC at baseline, troponin 220, chest x-ray showed interstitial edema, ECG showed his normal paced rhythm.  He was given Milly Jakob, nephro cardiology is consulted and admitted.    Subjective: Seen and examined this morning. Resting comfortably.  Denies any chest pain shortness of breath or diaphoresis. Had dialysis last night. Complains of cramps in his extremities and right hand now subsiding  Assessment and Plan: Principal Problem:   Chest pain Active Problems:   Obesity (BMI 30-39.9)   Pacemaker   Sleep apnea   Type II diabetes mellitus (HCC)   ESRD on hemodialysis (HCC)   COPD (chronic obstructive pulmonary disease) (HCC)   Hyperlipidemia   Hyperkalemia, diminished renal excretion   Anemia in chronic kidney disease   ESRD on dialysis [facility])   Atrial fibrillation (HCC) - SCAF   Aortic stenosis   Hypotension  Chest pain:Unstable angina vs NSTEMI History of multivessel CAD: Echo done this morning EF 45-50% stable, moderate aortic stenosis.Elevated troponin in 200, cardiology input appreciated>Continue aspirin/Plavix, Coreg, Imdur Lipitor along with heparin drip.  Plan is for card catheterization today and possible repeat intervention given symptom complex worrisome for recurrent in-stent restenosis.  Ischemic  cardiomyopathy Chronic systolic CHF: See 1.  Volume being managed in HD.  Not on ACE/ARB due to ESRD.  On Coreg as BP tolerates  ESRD on HD Anemia of CKD Metabolic bone disease Hyperkalemia: Patient received Lokelma, dialysis, nephrology following.  Hemoglobin is stable 10 g range monitor BMP, hemoglobin calcium phosphate level.k improved Recent Labs  Lab 05/06/22 1435 05/07/22 0744  BUN 34* 15  CREATININE 8.62* 5.32*    Recent Labs  Lab 05/06/22 1435 05/07/22 0136  HGB 10.4* 10.9*  HCT 33.1* 32.7*    Thrombocytopenia mild, monitor  CHB/AICD PM in place, monitoring in tele OSA on CPAP History of arctic stenosis Chronic hypotension on midodrine  Class I Obesity:Patient's Body mass index is 32.25 kg/m. : Will benefit with PCP follow-up, weight loss healthy lifestyle    DVT prophylaxis:   Heparin Code Status:   Code Status: Partial Code Family Communication: plan of care discussed with patient at bedside. Patient status is: Admitted under observation but remains hospitalized for ongoing cardiac work-up  Level of care: Telemetry Cardiac  Dispo: The patient is from: home            Anticipated disposition: home  Mobility Assessment (last 72 hours)     Mobility Assessment     Row Name 05/07/22 1005 05/07/22 0130         Does patient have an order for bedrest or is patient medically unstable No - Continue assessment No - Continue assessment      What is the highest level of mobility based on the progressive mobility assessment? Level 3 (Stands with assist) - Balance while standing  and  cannot march in place Level 3 (Stands with assist) - Balance while standing  and cannot march in place      Is the above level different from baseline mobility prior to current illness? Yes - Recommend PT order Yes - Recommend PT order                Objective: Vitals last 24 hrs: Vitals:   05/07/22 0152 05/07/22 0409 05/07/22 0819 05/07/22 1005  BP:  (!) 122/49 (!) 112/55 (!)  123/101  Pulse:  76 79   Resp:  20 18 18   Temp:  97.6 F (36.4 C) 98 F (36.7 C)   TempSrc:  Oral Oral   SpO2: 100% 97% 98% 99%  Weight:      Height:       Weight change:   Physical Examination: General exam: alert awake, older than stated age HEENT:Oral mucosa moist, Ear/Nose WNL grossly Respiratory system: bilaterally clear BS, no use of accessory muscle Cardiovascular system: S1 & S2 +, No JVD. Gastrointestinal system: Abdomen soft,NT,ND, BS+ Nervous System:Alert, awake, moving extremities. Extremities: LE edema neg,distal peripheral pulses palpable.  Skin: No rashes,no icterus. MSK: Normal muscle bulk,tone, power  Medications reviewed:  Scheduled Meds:  aspirin EC  81 mg Oral Daily   atorvastatin  80 mg Oral Daily   carvedilol  3.125 mg Oral BID WC   Chlorhexidine Gluconate Cloth  6 each Topical Q0600   clopidogrel  75 mg Oral Q breakfast   gabapentin  300 mg Oral BID   insulin aspart  0-15 Units Subcutaneous TID WC   insulin aspart  0-5 Units Subcutaneous QHS   isosorbide mononitrate  30 mg Oral Daily   pantoprazole  40 mg Oral Daily   sevelamer carbonate  1,600 mg Oral TID WC   sodium chloride flush  3 mL Intravenous Q12H   Continuous Infusions:  sodium chloride     sodium chloride 10 mL/hr at 05/07/22 0641   heparin 1,300 Units/hr (05/07/22 1202)      Diet Order             Diet NPO time specified Except for: Sips with Meds  Diet effective midnight                            Intake/Output Summary (Last 24 hours) at 05/07/2022 1239 Last data filed at 05/07/2022 0900 Gross per 24 hour  Intake 128.76 ml  Output 1500 ml  Net -1371.24 ml   Net IO Since Admission: -1,371.24 mL [05/07/22 1239]  Wt Readings from Last 3 Encounters:  05/07/22 93.4 kg  04/21/22 93.7 kg  04/02/22 96.5 kg     Unresulted Labs (From admission, onward)     Start     Ordered   05/13/22 0500  Creatinine, serum  (enoxaparin (LOVENOX)    CrCl < 30 ml/min)  Once,   R        Comments: while on enoxaparin therapy.    05/07/22 0151   05/08/22 0500  Heparin level (unfractionated)  Daily at 5am,   R     See Hyperspace for full Linked Orders Report.   05/07/22 0221   05/08/22 0500  CBC  Daily at 5am,   R     See Hyperspace for full Linked Orders Report.   05/07/22 0221   05/06/22 1852  Hepatitis B surface antibody,quantitative  (New Admission Hemo Labs (Hepatitis B))  Once,   R  05/06/22 1855          Data Reviewed: I have personally reviewed following labs and imaging studies CBC: Recent Labs  Lab 05/06/22 1435 05/07/22 0136  WBC 8.2 8.4  NEUTROABS 6.3  --   HGB 10.4* 10.9*  HCT 33.1* 32.7*  MCV 109.2* 104.8*  PLT 137* 322*   Basic Metabolic Panel: Recent Labs  Lab 05/06/22 1435 05/07/22 0744  NA 143 137  K 5.4* 3.8  CL 100 96*  CO2 29 30  GLUCOSE 261* 108*  BUN 34* 15  CREATININE 8.62* 5.32*  CALCIUM 9.8 8.8*   GFR: Estimated Creatinine Clearance: 10.5 mL/min (A) (by C-G formula based on SCr of 5.32 mg/dL (H)). Liver Function Tests: Recent Labs  Lab 05/06/22 1435  AST 19  ALT 22  ALKPHOS 95  BILITOT 0.3  PROT 6.4*  ALBUMIN 3.5   No results for input(s): "LIPASE", "AMYLASE" in the last 168 hours. No results for input(s): "AMMONIA" in the last 168 hours. Coagulation Profile: No results for input(s): "INR", "PROTIME" in the last 168 hours. BNP (last 3 results) No results for input(s): "PROBNP" in the last 8760 hours. HbA1C: No results for input(s): "HGBA1C" in the last 72 hours. CBG: Recent Labs  Lab 05/07/22 0125 05/07/22 0614 05/07/22 1113  GLUCAP 101* 106* 109*   Lipid Profile: No results for input(s): "CHOL", "HDL", "LDLCALC", "TRIG", "CHOLHDL", "LDLDIRECT" in the last 72 hours. Thyroid Function Tests: No results for input(s): "TSH", "T4TOTAL", "FREET4", "T3FREE", "THYROIDAB" in the last 72 hours. Sepsis Labs: No results for input(s): "PROCALCITON", "LATICACIDVEN" in the last 168 hours.  Recent  Results (from the past 240 hour(s))  MRSA Next Gen by PCR, Nasal     Status: None   Collection Time: 05/07/22  1:55 AM   Specimen: Nasal Mucosa; Nasal Swab  Result Value Ref Range Status   MRSA by PCR Next Gen NOT DETECTED NOT DETECTED Final    Comment: Performed at [facility], [place] [address], [place], [place] [address]    Antimicrobials: Anti-infectives (From admission, onward)    None      Culture/Microbiology    Component Value Date/Time   SDES URINE, CLEAN CATCH 04/07/2019 1931   SPECREQUEST  04/07/2019 1931    NONE Performed at [facility] [address], [place], [place] [address]    CULT >=100,000 COLONIES/mL PSEUDOMONAS AERUGINOSA (A) 04/07/2019 1931   REPTSTATUS 04/09/2019 FINAL 04/07/2019 1931    Other culture-see note  Radiology Studies: ECHOCARDIOGRAM COMPLETE  Result Date: 05/07/2022    ECHOCARDIOGRAM REPORT   Patient Name:   ALIJAH AKRAM Date of Exam: 05/07/2022 Medical Rec #:  [id number]             Height:       67.0 in Accession #:    [id number]            Weight:       205.9 lb Date of Birth:  02-11-1932             BSA:          2.047 m Patient Age:    50 years              BP:           122/49 mmHg Patient Gender: M                     HR:  80 bpm. Exam Location:  Inpatient Procedure: 2D Echo, Cardiac Doppler, Color Doppler and Intracardiac            Opacification Agent Indications:    R01.1 Murmur; R07.9* Chest pain, unspecified  History:        Patient has prior history of Echocardiogram examinations, most                 recent 11/11/2021. CHF, Previous Myocardial Infarction and CAD,                 Abnormal ECG, Defibrillator and Pacemaker, COPD, Aortic Valve                 Disease, Arrythmias:AV Block, Signs/Symptoms:Chest Pain,                 Hypotension, Murmur, Dyspnea and Shortness of Breath; Risk                 Factors:Diabetes and Hypertension. ESRD. Aortic stenosis.  Sonographer:    Roseanna Rainbow RDCS Referring Phys:  [id number] CHRISTOPHER P DANFORD  Sonographer Comments: Technically difficult study due to poor echo windows. Image acquisition challenging due to patient body habitus. IMPRESSIONS  1. Left ventricular ejection fraction, by estimation, is 45 to 50%. The left ventricle has mildly decreased function. The left ventricle demonstrates regional wall motion abnormalities (see scoring diagram/findings for description). Left ventricular diastolic parameters are indeterminate.  2. Right ventricular systolic function is normal. The right ventricular size is normal. There is normal pulmonary artery systolic pressure.  3. The mitral valve is normal in structure. Trivial mitral valve regurgitation. No evidence of mitral stenosis. Moderate mitral annular calcification.  4. The aortic valve is normal in structure. There is mild calcification of the aortic valve. There is mild thickening of the aortic valve. Aortic valve regurgitation is trivial. Moderate aortic valve stenosis. Aortic valve area, by VTI measures 1.42 cm. Aortic valve mean gradient measures 20.0 mmHg. Aortic valve Vmax measures 2.97 m/s.  5. The inferior vena cava is normal in size with greater than 50% respiratory variability, suggesting right atrial pressure of 3 mmHg. Comparison(s): A prior study was performed on 11/2021. No significant change from prior study. FINDINGS  Left Ventricle: Left ventricular ejection fraction, by estimation, is 45 to 50%. The left ventricle has mildly decreased function. The left ventricle demonstrates regional wall motion abnormalities. Definity contrast agent was given IV to delineate the left ventricular endocardial borders. The left ventricular internal cavity size was normal in size. There is borderline concentric left ventricular hypertrophy. Left ventricular diastolic function could not be evaluated due to mitral annular calcification (moderate or greater). Left ventricular diastolic parameters are indeterminate.  LV Wall Scoring:  The apical septal segment, apical inferior segment, and apex are hypokinetic. The entire anterior wall, entire lateral wall, anterior septum, inferior wall, mid inferoseptal segment, and basal inferoseptal segment are normal. Right Ventricle: The right ventricular size is normal. No increase in right ventricular wall thickness. Right ventricular systolic function is normal. There is normal pulmonary artery systolic pressure. The tricuspid regurgitant velocity is 2.12 m/s, and  with an assumed right atrial pressure of 3 mmHg, the estimated right ventricular systolic pressure is 77.9 mmHg. Left Atrium: Left atrial size was normal in size. Right Atrium: Right atrial size was normal in size. Pericardium: There is no evidence of pericardial effusion. Presence of epicardial fat layer. Mitral Valve: The mitral valve is normal in structure. Moderate mitral annular calcification. Trivial mitral valve regurgitation. No evidence  of mitral valve stenosis. Tricuspid Valve: The tricuspid valve is normal in structure. Tricuspid valve regurgitation is not demonstrated. No evidence of tricuspid stenosis. Aortic Valve: The aortic valve is normal in structure. There is mild calcification of the aortic valve. There is mild thickening of the aortic valve. There is moderate aortic valve annular calcification. Aortic valve regurgitation is trivial. Moderate aortic stenosis is present. Aortic valve mean gradient measures 20.0 mmHg. Aortic valve peak gradient measures 35.2 mmHg. Aortic valve area, by VTI measures 1.42 cm. Pulmonic Valve: The pulmonic valve was normal in structure. Pulmonic valve regurgitation is not visualized. No evidence of pulmonic stenosis. Aorta: The aortic root is normal in size and structure. Venous: The inferior vena cava is normal in size with greater than 50% respiratory variability, suggesting right atrial pressure of 3 mmHg. IAS/Shunts: No atrial level shunt detected by color flow Doppler. Additional Comments:  A device lead is visualized.  LEFT VENTRICLE PLAX 2D LVIDd:         5.70 cm      Diastology LVIDs:         4.20 cm      LV e' medial:    5.66 cm/s LV PW:         1.20 cm      LV E/e' medial:  20.1 LV IVS:        1.30 cm      LV e' lateral:   7.25 cm/s LVOT diam:     2.30 cm      LV E/e' lateral: 15.7 LV SV:         98 LV SV Index:   48 LVOT Area:     4.15 cm  LV Volumes (MOD) LV vol d, MOD A2C: 153.0 ml LV vol d, MOD A4C: 164.0 ml LV vol s, MOD A2C: 110.0 ml LV vol s, MOD A4C: 119.0 ml LV SV MOD A2C:     43.0 ml LV SV MOD A4C:     164.0 ml LV SV MOD BP:      38.5 ml RIGHT VENTRICLE            IVC RV S prime:     6.65 cm/s  IVC diam: 2.70 cm TAPSE (M-mode): 2.3 cm LEFT ATRIUM             Index        RIGHT ATRIUM           Index LA diam:        4.70 cm 2.30 cm/m   RA Area:     22.20 cm LA Vol (A2C):   54.2 ml 26.47 ml/m  RA Volume:   68.20 ml  33.31 ml/m LA Vol (A4C):   48.9 ml 23.88 ml/m LA Biplane Vol: 53.2 ml 25.98 ml/m  AORTIC VALVE AV Area (Vmax):    1.46 cm AV Area (Vmean):   1.39 cm AV Area (VTI):     1.42 cm AV Vmax:           296.60 cm/s AV Vmean:          208.400 cm/s AV VTI:            0.696 m AV Peak Grad:      35.2 mmHg AV Mean Grad:      20.0 mmHg LVOT Vmax:         104.00 cm/s LVOT Vmean:        69.800 cm/s LVOT VTI:  0.237 m LVOT/AV VTI ratio: 0.34  AORTA Ao Root diam: 3.20 cm Ao Asc diam:  3.50 cm MITRAL VALVE                TRICUSPID VALVE MV Area (PHT): 3.85 cm     TR Peak grad:   18.0 mmHg MV Decel Time: 197 msec     TR Vmax:        212.00 cm/s MV E velocity: 114.00 cm/s MV A velocity: 102.05 cm/s  SHUNTS MV E/A ratio:  1.12         Systemic VTI:  0.24 m                             Systemic Diam: 2.30 cm Kardie Tobb DO Electronically signed by Berniece Salines DO Signature Date/Time: 05/07/2022/10:51:40 AM    Final    DG Chest Portable 1 View  Result Date: 05/06/2022 CLINICAL DATA:  Shortness of breath which is worsening EXAM: PORTABLE CHEST 1 VIEW COMPARISON:  03/30/2022  FINDINGS: Chronic cardiomegaly. Pacemaker/AICD appears grossly unchanged. Pulmonary venous hypertension, possibly with mild interstitial edema. No pleural effusion. No focal consolidation. IMPRESSION: Chronic cardiomegaly. Pulmonary venous hypertension, possibly with mild interstitial edema. Electronically Signed   By: Nelson Chimes M.D.   On: 05/06/2022 14:03     LOS: 0 days   Antonieta Pert, MD [facility]  05/07/2022, 12:39 PM

## 2022-05-07 NOTE — Interval H&P Note (Signed)
Cath Lab Visit (complete for each Cath Lab visit)  Clinical Evaluation Leading to the Procedure:   ACS: Yes.    Non-ACS:    Anginal Classification: CCS IV  Anti-ischemic medical therapy: Minimal Therapy (1 class of medications)  Non-Invasive Test Results: No non-invasive testing performed  Prior CABG: No previous CABG      History and Physical Interval Note:  05/07/2022 2:52 PM  Rodney Farley  has presented today for surgery, with the diagnosis of NSTEMI.  The various methods of treatment have been discussed with the patient and family. After consideration of risks, benefits and other options for treatment, the patient has consented to  Procedure(s): LEFT HEART CATH AND CORONARY ANGIOGRAPHY (N/A) as a surgical intervention.  [name] history has been reviewed, patient examined, no change in status, stable for surgery.  I have reviewed the patient's chart and labs.  Questions were answered to the patient's satisfaction.     Larae Grooms

## 2022-05-07 NOTE — Plan of Care (Signed)
  Problem: Education: Goal: Understanding of CV disease, CV risk reduction, and recovery process will improve Outcome: Progressing   Problem: Cardiovascular: Goal: Vascular access site(s) Level 0-1 will be maintained Outcome: Progressing

## 2022-05-07 NOTE — Progress Notes (Signed)
[facility] Progress Note  Name: Sullivan Jacuinde MRN: [id number] DOB: 16-Nov-1931  Chief Complaint:  Chest pain   Subjective:   had HD overnight on 10/25 with 1.5 kg UF - limited UF due to cramping in his legs.  States that he had a good night.  He's been on heparin.  He states plan is still for heart cath today.  He's getting an echo in the room right now.   Review of systems:  Denies current shortness of breath  No current chest pain  No n/v  ------ Background on consult:  Geza Beranek is a 88 y.o. male with a history of ESRD, CAD, ischemic cardiomyopathy, HLD, T2DM, HTN, and severe COPD who presented to the ER with chest pain and shortness of breath.  The pain caused him to miss HD today.  He reports a shortened HD treatment on Monday.  Cardiology has been consulted and plans for cath tomorrow.  He states that he feels better now - breathing and chest pain are better, "I feel like a fraud because I feel better now".  He states that he doesn't want to be any trouble and I let him know that he has had legitimate angina and CAD.  He has been on midodrine pre-HD and I let him know that I'll order this.  He thinks that he can pull off 2kg without an issue.  Cardiology is concerned about restenosis of a stent.     Intake/Output Summary (Last 24 hours) at 05/07/2022 0907 Last data filed at 05/07/2022 0641 Gross per 24 hour  Intake 128.76 ml  Output 1500 ml  Net -1371.24 ml    Vitals:  Vitals:   05/07/22 0126 05/07/22 0152 05/07/22 0409 05/07/22 0819  BP: 106/76  (!) 122/49 (!) 112/55  Pulse: 71  76 79  Resp: 18  20 18   Temp: 98 F (36.7 C)  97.6 F (36.4 C) 98 F (36.7 C)  TempSrc: Oral  Oral Oral  SpO2: 100% 100% 97% 98%  Weight:      Height:         Physical Exam:  General: elderly male in stretcher in NAD at rest HEENT: NCAT Eyes: EOMI sclera anicteric Neck: supple trachea midline  Heart: S1S2 no rub Lungs: clear and unlabored Abdomen:  soft/nt/nd Extremities: trace edema lower extremities Skin: no rash on extremities exposed Neuro: alert and oriented x 3 provides hx and follows commands  Psych normal mood and affect Access: right AVF bruit and thrill   Medications reviewed   Labs:     Latest Ref Rng & Units 05/07/2022    7:44 AM 05/06/2022    2:35 PM 04/01/2022    4:11 AM  BMP  Glucose 70 - 99 mg/dL 108  261  112   BUN 8 - 23 mg/dL 15  34  22   Creatinine 0.61 - 1.24 mg/dL 5.32  8.62  6.22   Sodium 135 - 145 mmol/L 137  143  139   Potassium 3.5 - 5.1 mmol/L 3.8  5.4  4.6   Chloride 98 - 111 mmol/L 96  100  95   CO2 22 - 32 mmol/L 30  29  30    Calcium 8.9 - 10.3 mg/dL 8.8  9.8  9.5      Assessment/Plan:   Chest pain with CAD and unstable angina - cardiology consulted and per charting planning for cath  - s/p gentle HD overnight to optimize  - midodrine prior to HD per  prior regimen   ESRD  - HD per MWF schedule    HTN  - controlled   Anemia of CKD  - Hb 10.9. no ESA in setting of unstable angina    Metabolic bone disease  - continue activated vit D with HD if he remains hospitalized - binders when taking PO    Disposition per cardiology and primary team   Claudia Desanctis, MD 05/07/2022 9:21 AM

## 2022-05-07 NOTE — Progress Notes (Addendum)
Rounding Note    Patient Name: Rodney Farley Date of Encounter: 05/07/2022  [facility] Cardiologist: Verdis [name], MD   Subjective   Feels well this morning. No chest pain overnight. Underwent dialysis last night. Had leg cramping that was similar in nature to the cramping that caused him to end his session early on Monday. 1500 UF removed.  Inpatient Medications    Scheduled Meds:  aspirin EC  81 mg Oral Daily   atorvastatin  80 mg Oral Daily   carvedilol  3.125 mg Oral BID WC   Chlorhexidine Gluconate Cloth  6 each Topical Q0600   clopidogrel  75 mg Oral Q breakfast   gabapentin  300 mg Oral BID   insulin aspart  0-15 Units Subcutaneous TID WC   insulin aspart  0-5 Units Subcutaneous QHS   isosorbide mononitrate  30 mg Oral Daily   pantoprazole  40 mg Oral Daily   sevelamer carbonate  1,600 mg Oral TID WC   sodium chloride flush  3 mL Intravenous Q12H   sodium zirconium cyclosilicate  10 g Oral Once   Continuous Infusions:  sodium chloride     sodium chloride 10 mL/hr at 05/07/22 0641   heparin 1,100 Units/hr (05/07/22 0641)   PRN Meds: sodium chloride, sodium chloride flush   Vital Signs    Vitals:   05/07/22 0122 05/07/22 0126 05/07/22 0152 05/07/22 0409  BP:  106/76  (!) 122/49  Pulse:  71  76  Resp:  18  20  Temp:  98 F (36.7 C)  97.6 F (36.4 C)  TempSrc:  Oral  Oral  SpO2:  100% 100% 97%  Weight: 93.4 kg     Height: 5\' 7"  (1.702 m)       Intake/Output Summary (Last 24 hours) at 05/07/2022 0733 Last data filed at 05/07/2022 0641 Gross per 24 hour  Intake 128.76 ml  Output 1500 ml  Net -1371.24 ml      05/07/2022    1:22 AM 05/07/2022   12:30 AM 05/06/2022    8:22 PM  Last 3 Weights  Weight (lbs) 205 lb 14.4 oz 203 lb 4.2 oz 206 lb 9.1 oz  Weight (kg) 93.396 kg 92.2 kg 93.7 kg      Telemetry    Paced rhythm, irregular - Personally Reviewed  ECG    Pending study - Personally Reviewed  Physical Exam   GEN: No  acute distress.   Neck: No JVD Cardiac: 3/6 systolic murmur.  Respiratory: Slight crackles lung bases. GI: Soft, nontender, non-distended. +Bowel sounds. MS: Trace edema above sock line Neuro:  Nonfocal  Psych: Pleasant. Appropriate mood and affect.  Labs    High Sensitivity Troponin:   Recent Labs  Lab 05/06/22 1435 05/06/22 1746  TROPONINIHS 220* 275*     Chemistry Recent Labs  Lab 05/06/22 1435  NA 143  K 5.4*  CL 100  CO2 29  GLUCOSE 261*  BUN 34*  CREATININE 8.62*  CALCIUM 9.8  PROT 6.4*  ALBUMIN 3.5  AST 19  ALT 22  ALKPHOS 95  BILITOT 0.3  GFRNONAA 5*  ANIONGAP 14    Lipids No results for input(s): "CHOL", "TRIG", "HDL", "LABVLDL", "LDLCALC", "CHOLHDL" in the last 168 hours.  Hematology Recent Labs  Lab 05/06/22 1435 05/07/22 0136  WBC 8.2 8.4  RBC 3.03* 3.12*  HGB 10.4* 10.9*  HCT 33.1* 32.7*  MCV 109.2* 104.8*  MCH 34.3* 34.9*  MCHC 31.4 33.3  RDW 15.8* 15.6*  PLT  137* 128*   Thyroid No results for input(s): "TSH", "FREET4" in the last 168 hours.  BNPNo results for input(s): "BNP", "PROBNP" in the last 168 hours.  DDimer No results for input(s): "DDIMER" in the last 168 hours.   Radiology    DG Chest Portable 1 View  Result Date: 05/06/2022 CLINICAL DATA:  Shortness of breath which is worsening EXAM: PORTABLE CHEST 1 VIEW COMPARISON:  03/30/2022 FINDINGS: Chronic cardiomegaly. Pacemaker/AICD appears grossly unchanged. Pulmonary venous hypertension, possibly with mild interstitial edema. No pleural effusion. No focal consolidation. IMPRESSION: Chronic cardiomegaly. Pulmonary venous hypertension, possibly with mild interstitial edema. Electronically Signed   By: Nelson Chimes M.D.   On: 05/06/2022 14:03    Cardiac Studies  LHC (03/31/2022):   Ost RCA to Prox RCA lesion is 70% stenosed.   Prox Cx lesion is 30% stenosed.   Mid Cx to Dist Cx lesion is 40% stenosed.   Mid Cx lesion is 30% stenosed.   2nd Diag-1 lesion is 30% stenosed.   3rd  Mrg lesion is 50% stenosed.   Non-stenotic 2nd Diag-2 lesion was previously treated.   Dist LM to Ost LAD lesion is 90% stenosed. In stent restenosis.   Scoring balloon angioplasty was performed using a BALLN WOLVERINE 3.50X10, followed by PTCA with 4.0 [place] balloon.   Post intervention, there is a 20% residual stenosis.     RHC (02/09/2022): Right Heart Pressures RA (mean): 14 mmHg RV (S/EDP): 57/15 mmHg PA (S/D, mean): 57/30 (39) mmHg PCWP (mean): 33 mmHg  Ao sat: 92% PA sat: 72%  Fick CO: 10.6 L/min Fick CI: 5.0 L/min/m^2  Thermodilution CO: 6.5 L/min Thermodilution CI: 3.1 L/min/m^2    Echo (05/07/2022):  1. Left ventricular ejection fraction, by estimation, is 45 to 50%. The  left ventricle has mildly decreased function. The left ventricle  demonstrates regional wall motion abnormalities (see scoring  diagram/findings for description). Left ventricular  diastolic parameters are indeterminate.   2. Right ventricular systolic function is normal. The right ventricular  size is normal. There is normal pulmonary artery systolic pressure.   3. The mitral valve is normal in structure. Trivial mitral valve  regurgitation. No evidence of mitral stenosis. Moderate mitral annular  calcification.   4. The aortic valve is normal in structure. There is mild calcification  of the aortic valve. There is mild thickening of the aortic valve. Aortic  valve regurgitation is trivial. Moderate aortic valve stenosis. Aortic  valve area, by VTI measures 1.42  cm. Aortic valve mean gradient measures 20.0 mmHg. Aortic valve Vmax  measures 2.97 m/s.   5. The inferior vena cava is normal in size with greater than 50%  respiratory variability, suggesting right atrial pressure of 3 mmHg.     Patient Profile     88 y.o. male with history of HFrEF s/p CRT-D, ESRD on HD MWF, and multi-vessel CAD s/p PCI to LAD, 2nd diag, and OM 3 in 4270, complicated by several episodes of angina due to in-stent  restenosis of ostial LAD lesion, who presents with accelerated angina and elevated troponin concerning for UA versus NSTEMI.  Assessment & Plan   Principal Problem:   Chest pain Active Problems:   Obesity (BMI 30-39.9)   Pacemaker   Sleep apnea   Type II diabetes mellitus ([place])   ESRD on hemodialysis (HCC)   COPD (chronic obstructive pulmonary disease) (HCC)   Hyperlipidemia   Hyperkalemia, diminished renal excretion   Anemia in chronic kidney disease   ESRD on dialysis ([place])  Atrial fibrillation (HCC) - SCAF   Aortic stenosis   Hypotension  UA versus NSTEMI Condition has improved with no chest pain overnight. Patient has been chest pain free for approximately 18 hours at the time of my interview. Will repeat troponin and EKG today. Per today's echo, EF 45-50% w/ regional wall motion abnormalities similar to study from May 2023. Plan for [facility] today with Dr. Irish Lack at 1500. -Heparin gtt -Imdur -ASA/Plavix indefinitely -Carvedilol 3.125 mg BID -Follow up echo and EKG -LHC today  Ischemic cardiomyopathy Chronic systolic CHF NYHA III Without signs of significant volume overload today. CRT-D. Carvedilol as above. Appreciate nephrology assistance with dialysis for volume management.  Afib Subclinical atrial fibrillation noted on previous device interrogation, not on anticoagulation PTA given low Afib burden -Heparin gtt as above  ESRD on HD MWF Describes producing less than a tablespoon of urine per day, only enough to stain his underpants, consistent with anuric ESRD. -dialysis      For questions or updates, please contact [place] Please consult [email or web address] for contact info under        Signed, Nani Gasser, MD  05/07/2022, 7:33 AM     Patient seen and examined. Agree with assessment and plan.  Patient currently feels improved.  He underwent successful dialysis last night with removal of 1.5 kg-UF, with limited ultrafiltration due to cramping of  his legs.  Currently he is pain-free on current medical regimen.  Echo Doppler study today shows stable EF at 45 to 50% with moderate aortic stenosis with mean gradient of 20.  He is on carvedilol 3.125 mg twice a day, isosorbide 30 mg, has continued to be on aspirin/Plavix.  Atorvastatin is at 80 mg for hyperlipidemia with target less than 55.  He is scheduled to undergo repeat cardiac catheterization today with probable need for repeat intervention.  Symptom complex is worrisome for recurrent in-stent restenosis.  Patient is fully aware of the risk benefits of the procedure.  Question if candidate for possible brachytherapy at another center in the future.    Troy Sine, MD, [facility] 05/07/2022 11:36 AM

## 2022-05-07 NOTE — Progress Notes (Signed)
SITE AREA: right groin/femoral  SITE PRIOR TO REMOVAL:  LEVEL 0  PRESSURE APPLIED FOR: approximately 20 minutes  MANUAL: yes  PATIENT STATUS DURING PULL: stable  POST PULL SITE:  LEVEL 0  POST PULL INSTRUCTIONS GIVEN: yes  POST PULL PULSES PRESENT: bilateral pedal pulses at +2  DRESSING APPLIED: gauze with tegaderm  BEDREST BEGINS @ 1820  COMMENTS:  small tegaderm with 2x2 gauze placed over (removed) suture site, scant oozing noted, distal to sheath insertion site

## 2022-05-07 NOTE — Progress Notes (Signed)
Patient arrived with oxygen from dialysis. Assessment done, vital signs within normal limits. Oxygen removed, o2 sat = 100% Room Air. Heparin drip started.

## 2022-05-08 ENCOUNTER — Encounter (HOSPITAL_COMMUNITY): Payer: Self-pay

## 2022-05-08 ENCOUNTER — Other Ambulatory Visit (HOSPITAL_COMMUNITY): Payer: Self-pay

## 2022-05-08 ENCOUNTER — Telehealth (HOSPITAL_COMMUNITY): Payer: Self-pay | Admitting: Pharmacy Technician

## 2022-05-08 ENCOUNTER — Encounter (HOSPITAL_COMMUNITY): Payer: Self-pay | Admitting: Interventional Cardiology

## 2022-05-08 DIAGNOSIS — J449 Chronic obstructive pulmonary disease, unspecified: Secondary | ICD-10-CM | POA: Diagnosis present

## 2022-05-08 DIAGNOSIS — Y831 Surgical operation with implant of artificial internal device as the cause of abnormal reaction of the patient, or of later complication, without mention of misadventure at the time of the procedure: Secondary | ICD-10-CM | POA: Diagnosis present

## 2022-05-08 DIAGNOSIS — R7989 Other specified abnormal findings of blood chemistry: Secondary | ICD-10-CM | POA: Diagnosis present

## 2022-05-08 DIAGNOSIS — I442 Atrioventricular block, complete: Secondary | ICD-10-CM | POA: Diagnosis present

## 2022-05-08 DIAGNOSIS — I200 Unstable angina: Secondary | ICD-10-CM | POA: Diagnosis not present

## 2022-05-08 DIAGNOSIS — I272 Pulmonary hypertension, unspecified: Secondary | ICD-10-CM | POA: Diagnosis present

## 2022-05-08 DIAGNOSIS — Z992 Dependence on renal dialysis: Secondary | ICD-10-CM | POA: Diagnosis not present

## 2022-05-08 DIAGNOSIS — I480 Paroxysmal atrial fibrillation: Secondary | ICD-10-CM | POA: Diagnosis present

## 2022-05-08 DIAGNOSIS — Z87891 Personal history of nicotine dependence: Secondary | ICD-10-CM | POA: Diagnosis not present

## 2022-05-08 DIAGNOSIS — E7800 Pure hypercholesterolemia, unspecified: Secondary | ICD-10-CM | POA: Diagnosis present

## 2022-05-08 DIAGNOSIS — I5022 Chronic systolic (congestive) heart failure: Secondary | ICD-10-CM | POA: Diagnosis present

## 2022-05-08 DIAGNOSIS — E1142 Type 2 diabetes mellitus with diabetic polyneuropathy: Secondary | ICD-10-CM | POA: Diagnosis present

## 2022-05-08 DIAGNOSIS — I132 Hypertensive heart and chronic kidney disease with heart failure and with stage 5 chronic kidney disease, or end stage renal disease: Secondary | ICD-10-CM | POA: Diagnosis present

## 2022-05-08 DIAGNOSIS — T82855A Stenosis of coronary artery stent, initial encounter: Secondary | ICD-10-CM | POA: Diagnosis present

## 2022-05-08 DIAGNOSIS — G1221 Amyotrophic lateral sclerosis: Secondary | ICD-10-CM | POA: Diagnosis present

## 2022-05-08 DIAGNOSIS — E1122 Type 2 diabetes mellitus with diabetic chronic kidney disease: Secondary | ICD-10-CM | POA: Diagnosis present

## 2022-05-08 DIAGNOSIS — G4733 Obstructive sleep apnea (adult) (pediatric): Secondary | ICD-10-CM | POA: Diagnosis present

## 2022-05-08 DIAGNOSIS — E875 Hyperkalemia: Secondary | ICD-10-CM | POA: Diagnosis present

## 2022-05-08 DIAGNOSIS — Z95810 Presence of automatic (implantable) cardiac defibrillator: Secondary | ICD-10-CM | POA: Diagnosis not present

## 2022-05-08 DIAGNOSIS — I350 Nonrheumatic aortic (valve) stenosis: Secondary | ICD-10-CM | POA: Diagnosis not present

## 2022-05-08 DIAGNOSIS — Z79899 Other long term (current) drug therapy: Secondary | ICD-10-CM | POA: Diagnosis not present

## 2022-05-08 DIAGNOSIS — I209 Angina pectoris, unspecified: Secondary | ICD-10-CM | POA: Diagnosis not present

## 2022-05-08 DIAGNOSIS — I9589 Other hypotension: Secondary | ICD-10-CM | POA: Diagnosis present

## 2022-05-08 DIAGNOSIS — N186 End stage renal disease: Secondary | ICD-10-CM | POA: Diagnosis present

## 2022-05-08 DIAGNOSIS — R34 Anuria and oliguria: Secondary | ICD-10-CM | POA: Diagnosis present

## 2022-05-08 DIAGNOSIS — D631 Anemia in chronic kidney disease: Secondary | ICD-10-CM | POA: Diagnosis present

## 2022-05-08 DIAGNOSIS — I25110 Atherosclerotic heart disease of native coronary artery with unstable angina pectoris: Secondary | ICD-10-CM | POA: Diagnosis present

## 2022-05-08 DIAGNOSIS — E669 Obesity, unspecified: Secondary | ICD-10-CM | POA: Diagnosis present

## 2022-05-08 DIAGNOSIS — I080 Rheumatic disorders of both mitral and aortic valves: Secondary | ICD-10-CM | POA: Diagnosis present

## 2022-05-08 LAB — CBC
HCT: 30.9 % — ABNORMAL LOW (ref 39.0–52.0)
Hemoglobin: 9.9 g/dL — ABNORMAL LOW (ref 13.0–17.0)
MCH: 34.3 pg — ABNORMAL HIGH (ref 26.0–34.0)
MCHC: 32 g/dL (ref 30.0–36.0)
MCV: 106.9 fL — ABNORMAL HIGH (ref 80.0–100.0)
Platelets: 141 10*3/uL — ABNORMAL LOW (ref 150–400)
RBC: 2.89 MIL/uL — ABNORMAL LOW (ref 4.22–5.81)
RDW: 15.3 % (ref 11.5–15.5)
WBC: 6.5 10*3/uL (ref 4.0–10.5)
nRBC: 0 % (ref 0.0–0.2)

## 2022-05-08 LAB — BASIC METABOLIC PANEL WITH GFR
Anion gap: 10 (ref 5–15)
BUN: 25 mg/dL — ABNORMAL HIGH (ref 8–23)
CO2: 30 mmol/L (ref 22–32)
Calcium: 8.8 mg/dL — ABNORMAL LOW (ref 8.9–10.3)
Chloride: 98 mmol/L (ref 98–111)
Creatinine, Ser: 7.03 mg/dL — ABNORMAL HIGH (ref 0.61–1.24)
GFR, Estimated: 7 mL/min — ABNORMAL LOW
Glucose, Bld: 86 mg/dL (ref 70–99)
Potassium: 4.6 mmol/L (ref 3.5–5.1)
Sodium: 138 mmol/L (ref 135–145)

## 2022-05-08 LAB — GLUCOSE, CAPILLARY
Glucose-Capillary: 95 mg/dL (ref 70–99)
Glucose-Capillary: 87 mg/dL (ref 70–99)

## 2022-05-08 LAB — HEPATITIS B SURFACE ANTIBODY, QUANTITATIVE: Hep B S AB Quant (Post): 12.2 m[IU]/mL (ref >9.9)

## 2022-05-08 MED ORDER — MIDODRINE HCL 5 MG PO TABS
10.0000 mg | ORAL_TABLET | Freq: Once | ORAL | Status: AC
  Administered 2022-05-08: 10 mg via ORAL
  Filled 2022-05-08: qty 2

## 2022-05-08 MED ORDER — TICAGRELOR 90 MG PO TABS: 90.0000 mg | ORAL_TABLET | Freq: Two times a day (BID) | ORAL | Status: DC

## 2022-05-08 MED ORDER — EZETIMIBE 10 MG PO TABS
10.0000 mg | ORAL_TABLET | Freq: Every day | ORAL | 11 refills | Status: AC
  Filled 2022-05-08: qty 30, 30d supply, fill #0

## 2022-05-08 MED ORDER — TICAGRELOR 90 MG PO TABS
180.0000 mg | ORAL_TABLET | Freq: Once | ORAL | Status: AC
  Administered 2022-05-08: 180 mg via ORAL
  Filled 2022-05-08: qty 2

## 2022-05-08 MED ORDER — CARVEDILOL 3.125 MG PO TABS
3.1250 mg | ORAL_TABLET | Freq: Two times a day (BID) | ORAL | 0 refills | Status: AC
  Filled 2022-05-08: qty 60, 30d supply, fill #0

## 2022-05-08 MED ORDER — ISOSORBIDE MONONITRATE ER 30 MG PO TB24: 15.0000 mg | ORAL_TABLET | Freq: Every day | ORAL | Status: DC

## 2022-05-08 MED ORDER — ISOSORBIDE MONONITRATE ER 30 MG PO TB24
15.0000 mg | ORAL_TABLET | Freq: Every day | ORAL | 0 refills | Status: DC
  Filled 2022-05-08: qty 15, 30d supply, fill #0

## 2022-05-08 MED ORDER — EZETIMIBE 10 MG PO TABS
10.0000 mg | ORAL_TABLET | Freq: Every day | ORAL | Status: DC
  Administered 2022-05-08: 10 mg via ORAL
  Filled 2022-05-08: qty 1

## 2022-05-08 MED ORDER — TICAGRELOR 90 MG PO TABS
90.0000 mg | ORAL_TABLET | Freq: Two times a day (BID) | ORAL | 11 refills | Status: AC
  Filled 2022-05-08: qty 60, 30d supply, fill #0

## 2022-05-08 NOTE — Telephone Encounter (Signed)
Forms in your sign basket.

## 2022-05-08 NOTE — Discharge Summary (Signed)
Physician Discharge Summary  Rodney Farley STM:[id number] DOB: 1931-11-07 DOA: 05/06/2022  PCP: [name], MD  Admit date: 05/06/2022 Discharge date: 05/08/2022 Recommendations for Outpatient Follow-up:  Follow up with PCP in 1 weeks-call for appointment Follow-up with your cardiology Please obtain BMP/CBC in one week  Discharge Dispo: home w/ [facility] Discharge Condition: Stable Code Status:   Code Status: Partial Code Diet recommendation:  Diet Order             Diet Carb Modified Fluid consistency: Thin; Room service appropriate? Yes  Diet effective now                    Brief/Interim Summary: 88 y.o. M with ESRD on HD MWF, CHF status post AICD, and CHB status post CRT-D PPM, DM, HTN, OSA, obesity, ALS, chronic hypotension on midodrine, PAF, and coronary disease with 4 admissions this year for PCI admitted with working diagnosis of stable angina hypokalemia  Typically he has 1 episode of angina per day relieved with nitroglycerin but had 3 episodes so brought to the ED.  Chest pain had resolved in the ED.Of note  he only completed 1 hour of dialysis on Monday due to excruciating foot cramps In the ER, potassium 5.4, CBC at baseline, troponin 220, chest x-ray showed interstitial edema, ECG showed his normal paced rhythm.  He was given Milly Jakob, nephro cardiology is consulted and admitted. Patient felt to be having unusual angina versus NSTEMI-resume heparin drip echocardiogram done. 10/26 underwent cardiac cath-found to have another episode of restenosis in the ostial to proximal LAD successful balloon angioplasty, unfortunately no other options at our facility will need to look into availability of drug-coated balloon or brachytherapy as a possible treatment if this recurs.  Patient on aspirin 81, Plavix> changed to Brilinta, Zetia added to Lipitor for goal of LDL less than 50 and was given Coreg and Imdur. Plans for dialysis today after which patient will be discharged  home  Discussed with Dr. Claiborne Billings re: discharge meds with low-dose Imdur, low-dose Coreg Discharge Diagnoses:  Principal Problem:   Chest pain Active Problems:   Obesity (BMI 30-39.9)   Pacemaker   Sleep apnea   Type II diabetes mellitus (HCC)   Angina pectoris (HCC)   ESRD on hemodialysis (HCC)   COPD (chronic obstructive pulmonary disease) (HCC)   Hyperlipidemia   Hyperkalemia, diminished renal excretion   Anemia in chronic kidney disease   Atrial fibrillation (HCC) - SCAF   Aortic stenosis   Hypotension  Chest pain secondary to in-stent restenosis  History of multivessel CAD: Echo done this morning EF 45-50% stable, moderate aortic stenosis.Elevated troponin in 200, cardiology input appreciated. 10/26 underwent cardiac cath-found to have another episode of restenosis in the ostial to proximal LAD successful balloon angioplasty, unfortunately no other options at our facility will need to look into availability of drug-coated balloon or brachytherapy as a possible treatment if this recurs.  Patient on aspirin 81, Plavix> changed to Brilinta, Zetia added to Lipitor for goal of LDL less than 50 and was given Coreg and Imdur. Plans for dialysis today after which patient will be discharged home   Ischemic cardiomyopathy Chronic systolic CHF: See 1.  Volume being managed in HD.  Not on ACE/ARB due to ESRD.  On Coreg/Imdur as BP tolerates   ESRD on HD Anemia of CKD Metabolic bone disease Hyperkalemia: Patient received Lokelma, dialysis, nephrology following.  Hemoglobin is stable 10 g range monitor BMP, hemoglobin calcium phosphate level.k improved.  Patient  will need dialysis prior to discharge Thrombocytopenia mild, monitor   CHB/AICD PM in place, monitoring in tele OSA on CPAP History of arctic stenosis Chronic hypotension on midodrine   Class I Obesity:Patient's Body mass index is 32.25 kg/m. : Will benefit with PCP follow-up, weight loss healthy  lifestyle  Consults: Cardiology, nephrology Subjective: Alert oriented no chest pain.  Eager to go home but would like to get dialysis before discharge.  Discharge Exam: Vitals:   05/08/22 1410 05/08/22 1503  BP:  131/65  Pulse: 80 80  Resp: 19 17  Temp:    SpO2: 99% 99%   General: Pt is alert, awake, not in acute distress Cardiovascular: RRR, S1/S2 +, no rubs, no gallops Respiratory: CTA bilaterally, no wheezing, no rhonchi Abdominal: Soft, NT, ND, bowel sounds + Extremities: no edema, no cyanosis  Discharge Instructions  Discharge Instructions     Amb Referral to Cardiac Rehabilitation   Complete by: As directed    Diagnosis:  Coronary Stents NSTEMI     After initial evaluation and assessments completed: Virtual Based Care may be provided alone or in conjunction with Phase 2 Cardiac Rehab based on patient barriers.: Yes   Intensive Cardiac Rehabilitation (ICR) [place] location only OR Traditional Cardiac Rehabilitation (TCR) *If criteria for ICR are not met will enroll in TCR [facility] only): Yes   Discharge instructions   Complete by: As directed    Please call call MD or return to ER for similar or worsening recurring problem that brought you to hospital or if any fever,nausea/vomiting,abdominal pain, uncontrolled pain, chest pain,  shortness of breath or any other alarming symptoms.  Please follow-up your doctor as instructed in a week time and call the office for appointment.  Please avoid alcohol, smoking, or any other illicit substance and maintain healthy habits including taking your regular medications as prescribed.  You were cared for by a hospitalist during your hospital stay. If you have any questions about your discharge medications or the care you received while you were in the hospital after you are discharged, you can call the unit and ask to speak with the hospitalist on call if the hospitalist that took care of you is not available.  Once you are discharged, your  primary care physician will handle any further medical issues. Please note that NO REFILLS for any discharge medications will be authorized once you are discharged, as it is imperative that you return to your primary care physician (or establish a relationship with a primary care physician if you do not have one) for your aftercare needs so that they can reassess your need for medications and monitor your lab values   Discharge wound care:   Complete by: As directed    Wound care to full thickness wound to right posterior foot: Cleanse with soap and water, rinse and dry. Apply a size appropriate piece of xeroform gauze, top with dry gauze 2x2 and cover with silicone foam dressing. Change every other day.   Increase activity slowly   Complete by: As directed       Allergies as of 05/08/2022       Reactions   Bee Venom Anaphylaxis   Lyrica [pregabalin] Other (See Comments)   hallucinations   Prednisone Other (See Comments)   hallucinations   Zocor [simvastatin] Nausea Only, Other (See Comments)   Headache with brand name only.  Can take the generic.        Medication List     STOP taking these medications  clonazePAM 0.25 MG disintegrating tablet Commonly known as: KLONOPIN   clopidogrel 75 MG tablet Commonly known as: PLAVIX       TAKE these medications    aspirin EC 81 MG tablet Take 81 mg by mouth daily.   atorvastatin 80 MG tablet Commonly known as: LIPITOR Take 1 tablet by mouth daily.   Brilinta 90 MG Tabs tablet Generic drug: ticagrelor Take 1 tablet (90 mg total) by mouth 2 (two) times daily.   calcitRIOL 0.25 MCG capsule Commonly known as: ROCALTROL Take 7 capsules (1.75 mcg total) by mouth every Monday, Wednesday, and Friday with hemodialysis.   carbamide peroxide 6.5 % OTIC solution Commonly known as: DEBROX Place 5 drops into both ears 2 (two) times daily as needed (earwax).   carvedilol 3.125 MG tablet Commonly known as: COREG Take 1 tablet  (3.125 mg total) by mouth 2 (two) times daily with a meal. Hold for SBP <100 mmhg OR hr < 60/min   cyanocobalamin 1000 MCG tablet Commonly known as: VITAMIN B12 Take 1,000 mcg by mouth daily.   ezetimibe 10 MG tablet Commonly known as: ZETIA Take 1 tablet (10 mg total) by mouth daily.   FREESTYLE TEST STRIPS test strip Generic drug: glucose blood Use to check blood sugar daily   gabapentin 300 MG capsule Commonly known as: NEURONTIN Take 1 capsule (300 mg total) by mouth 2 (two) times daily.   glucose monitoring kit monitoring kit USE TO MONITOR BLOOD GLUCOSE AS DIRECTED   isosorbide mononitrate 30 MG 24 hr tablet Commonly known as: IMDUR Take 1/2 tablet (15 mg total) by mouth daily. Start taking on: May 09, 2022   LUBRICATING EYE DROPS OP Place 1 drop into both eyes as needed (dry eyes).   midodrine 10 MG tablet Commonly known as: PROAMATINE Take 0.5-1 tablets (5-10 mg total) by mouth See admin instructions. Take 1 tablet (10 mg) three times a week. Add 5 mg a night after dialysis as needed for low blood pressure.   MIRCERA IJ as directed.   multivitamin Tabs tablet Take 1 tablet by mouth daily.   nitroGLYCERIN 0.4 MG SL tablet Commonly known as: NITROSTAT Place 1 tablet under the tongue every 5 minutes x 3 doses as needed for chest pain.   oxymetazoline 0.05 % nasal spray Commonly known as: AFRIN Place 1 spray into both nostrils 2 (two) times daily as needed for congestion.   pantoprazole 40 MG tablet Commonly known as: PROTONIX Take 1 tablet by mouth daily.   Repatha SureClick 481 MG/ML Soaj Generic drug: Evolocumab Inject 140 mg into the skin every 14 (fourteen) days.   saccharomyces boulardii 250 MG capsule Commonly known as: FLORASTOR Take 1 capsule (250 mg total) by mouth 2 (two) times daily.   sevelamer carbonate 800 MG tablet Commonly known as: RENVELA Take 2 tablets (1,600 mg total) by mouth 3 (three) times daily with meals. What changed:   when to take this additional instructions               Discharge Care Instructions  (From admission, onward)           Start     Ordered   05/08/22 0000  Discharge wound care:       Comments: Wound care to full thickness wound to right posterior foot: Cleanse with soap and water, rinse and dry. Apply a size appropriate piece of xeroform gauze, top with dry gauze 2x2 and cover with silicone foam dressing. Change every other day.  05/08/22 1341            Follow-up Information     [name], MD Follow up in 1 week(s).   Specialty: Family Medicine Contact information: [place] [address] [place], [place] Follow up.   Specialty: Home Health Services Why: Physical and Occupational Therapy-office to call with visit times. Contact information: [address] [place] [address] [phone]                Allergies  Allergen Reactions   Bee Venom Anaphylaxis   Lyrica [Pregabalin] Other (See Comments)    hallucinations   Prednisone Other (See Comments)    hallucinations   Zocor [Simvastatin] Nausea Only and Other (See Comments)    Headache with brand name only.  Can take the generic.    The results of significant diagnostics from this hospitalization (including imaging, microbiology, ancillary and laboratory) are listed below for reference.    Microbiology: Recent Results (from the past 240 hour(s))  MRSA Next Gen by PCR, Nasal     Status: None   Collection Time: 05/07/22  1:55 AM   Specimen: Nasal Mucosa; Nasal Swab  Result Value Ref Range Status   MRSA by PCR Next Gen NOT DETECTED NOT DETECTED Final    Comment: Performed at [facility], [place] [address], [place], [place] [address]    Procedures/Studies: CARDIAC CATHETERIZATION  Result Date: 05/07/2022   Ost RCA to Prox RCA lesion is 70% stenosed.  Small vessel.   Prox Cx lesion is 30% stenosed.   Mid Cx to Dist Cx  lesion is 40% stenosed.   Mid Cx lesion is 30% stenosed.   2nd Diag-1 lesion is 30% stenosed.   3rd Mrg lesion is 50% stenosed.   Non-stenotic 2nd Diag-2 lesion was previously treated.  Patent stent.   Dist LM to Ost LAD lesion is 90% stenosed.   Scoring balloon angioplasty was performed using a BALLN SCOREFLEX 3.50X10.   Post intervention, there is a 20% residual stenosis. Another episode of restenosis in the ostial to proximal LAD.  Successful balloon angioplasty with score flex balloon at high pressure followed by Clifford balloon at high pressure. Unfortunately, there are no other options at our facility.  We will need to look into availability of drug-coated balloon or brachytherapy as possible treatments if this recurs.   ECHOCARDIOGRAM COMPLETE  Result Date: 05/07/2022    ECHOCARDIOGRAM REPORT   Patient Name:   Rodney Farley Date of Exam: 05/07/2022 Medical Rec #:  [id number]             Height:       67.0 in Accession #:    [id number]            Weight:       205.9 lb Date of Birth:  June 27, 1932             BSA:          2.047 m Patient Age:    88 years              BP:           122/49 mmHg Patient Gender: M                     HR:           80 bpm. Exam Location:  Inpatient Procedure: 2D Echo,  Cardiac Doppler, Color Doppler and Intracardiac            Opacification Agent Indications:    R01.1 Murmur; R07.9* Chest pain, unspecified  History:        Patient has prior history of Echocardiogram examinations, most                 recent 11/11/2021. CHF, Previous Myocardial Infarction and CAD,                 Abnormal ECG, Defibrillator and Pacemaker, COPD, Aortic Valve                 Disease, Arrythmias:AV Block, Signs/Symptoms:Chest Pain,                 Hypotension, Murmur, Dyspnea and Shortness of Breath; Risk                 Factors:Diabetes and Hypertension. ESRD. Aortic stenosis.  Sonographer:    Roseanna Rainbow RDCS Referring Phys: [id number] CHRISTOPHER P DANFORD  Sonographer Comments: Technically difficult  study due to poor echo windows. Image acquisition challenging due to patient body habitus. IMPRESSIONS  1. Left ventricular ejection fraction, by estimation, is 45 to 50%. The left ventricle has mildly decreased function. The left ventricle demonstrates regional wall motion abnormalities (see scoring diagram/findings for description). Left ventricular diastolic parameters are indeterminate.  2. Right ventricular systolic function is normal. The right ventricular size is normal. There is normal pulmonary artery systolic pressure.  3. The mitral valve is normal in structure. Trivial mitral valve regurgitation. No evidence of mitral stenosis. Moderate mitral annular calcification.  4. The aortic valve is normal in structure. There is mild calcification of the aortic valve. There is mild thickening of the aortic valve. Aortic valve regurgitation is trivial. Moderate aortic valve stenosis. Aortic valve area, by VTI measures 1.42 cm. Aortic valve mean gradient measures 20.0 mmHg. Aortic valve Vmax measures 2.97 m/s.  5. The inferior vena cava is normal in size with greater than 50% respiratory variability, suggesting right atrial pressure of 3 mmHg. Comparison(s): A prior study was performed on 11/2021. No significant change from prior study. FINDINGS  Left Ventricle: Left ventricular ejection fraction, by estimation, is 45 to 50%. The left ventricle has mildly decreased function. The left ventricle demonstrates regional wall motion abnormalities. Definity contrast agent was given IV to delineate the left ventricular endocardial borders. The left ventricular internal cavity size was normal in size. There is borderline concentric left ventricular hypertrophy. Left ventricular diastolic function could not be evaluated due to mitral annular calcification (moderate or greater). Left ventricular diastolic parameters are indeterminate.  LV Wall Scoring: The apical septal segment, apical inferior segment, and apex are  hypokinetic. The entire anterior wall, entire lateral wall, anterior septum, inferior wall, mid inferoseptal segment, and basal inferoseptal segment are normal. Right Ventricle: The right ventricular size is normal. No increase in right ventricular wall thickness. Right ventricular systolic function is normal. There is normal pulmonary artery systolic pressure. The tricuspid regurgitant velocity is 2.12 m/s, and  with an assumed right atrial pressure of 3 mmHg, the estimated right ventricular systolic pressure is 83.1 mmHg. Left Atrium: Left atrial size was normal in size. Right Atrium: Right atrial size was normal in size. Pericardium: There is no evidence of pericardial effusion. Presence of epicardial fat layer. Mitral Valve: The mitral valve is normal in structure. Moderate mitral annular calcification. Trivial mitral valve regurgitation. No evidence of mitral valve stenosis. Tricuspid Valve: The tricuspid valve  is normal in structure. Tricuspid valve regurgitation is not demonstrated. No evidence of tricuspid stenosis. Aortic Valve: The aortic valve is normal in structure. There is mild calcification of the aortic valve. There is mild thickening of the aortic valve. There is moderate aortic valve annular calcification. Aortic valve regurgitation is trivial. Moderate aortic stenosis is present. Aortic valve mean gradient measures 20.0 mmHg. Aortic valve peak gradient measures 35.2 mmHg. Aortic valve area, by VTI measures 1.42 cm. Pulmonic Valve: The pulmonic valve was normal in structure. Pulmonic valve regurgitation is not visualized. No evidence of pulmonic stenosis. Aorta: The aortic root is normal in size and structure. Venous: The inferior vena cava is normal in size with greater than 50% respiratory variability, suggesting right atrial pressure of 3 mmHg. IAS/Shunts: No atrial level shunt detected by color flow Doppler. Additional Comments: A device lead is visualized.  LEFT VENTRICLE PLAX 2D LVIDd:          5.70 cm      Diastology LVIDs:         4.20 cm      LV e' medial:    5.66 cm/s LV PW:         1.20 cm      LV E/e' medial:  20.1 LV IVS:        1.30 cm      LV e' lateral:   7.25 cm/s LVOT diam:     2.30 cm      LV E/e' lateral: 15.7 LV SV:         98 LV SV Index:   48 LVOT Area:     4.15 cm  LV Volumes (MOD) LV vol d, MOD A2C: 153.0 ml LV vol d, MOD A4C: 164.0 ml LV vol s, MOD A2C: 110.0 ml LV vol s, MOD A4C: 119.0 ml LV SV MOD A2C:     43.0 ml LV SV MOD A4C:     164.0 ml LV SV MOD BP:      38.5 ml RIGHT VENTRICLE            IVC RV S prime:     6.65 cm/s  IVC diam: 2.70 cm TAPSE (M-mode): 2.3 cm LEFT ATRIUM             Index        RIGHT ATRIUM           Index LA diam:        4.70 cm 2.30 cm/m   RA Area:     22.20 cm LA Vol (A2C):   54.2 ml 26.47 ml/m  RA Volume:   68.20 ml  33.31 ml/m LA Vol (A4C):   48.9 ml 23.88 ml/m LA Biplane Vol: 53.2 ml 25.98 ml/m  AORTIC VALVE AV Area (Vmax):    1.46 cm AV Area (Vmean):   1.39 cm AV Area (VTI):     1.42 cm AV Vmax:           296.60 cm/s AV Vmean:          208.400 cm/s AV VTI:            0.696 m AV Peak Grad:      35.2 mmHg AV Mean Grad:      20.0 mmHg LVOT Vmax:         104.00 cm/s LVOT Vmean:        69.800 cm/s LVOT VTI:          0.237 m LVOT/AV VTI ratio: 0.34  AORTA Ao Root diam: 3.20 cm Ao Asc diam:  3.50 cm MITRAL VALVE                TRICUSPID VALVE MV Area (PHT): 3.85 cm     TR Peak grad:   18.0 mmHg MV Decel Time: 197 msec     TR Vmax:        212.00 cm/s MV E velocity: 114.00 cm/s MV A velocity: 102.05 cm/s  SHUNTS MV E/A ratio:  1.12         Systemic VTI:  0.24 m                             Systemic Diam: 2.30 cm Kardie Tobb DO Electronically signed by Berniece Salines DO Signature Date/Time: 05/07/2022/10:51:40 AM    Final    DG Chest Portable 1 View  Result Date: 05/06/2022 CLINICAL DATA:  Shortness of breath which is worsening EXAM: PORTABLE CHEST 1 VIEW COMPARISON:  03/30/2022 FINDINGS: Chronic cardiomegaly. Pacemaker/AICD appears grossly  unchanged. Pulmonary venous hypertension, possibly with mild interstitial edema. No pleural effusion. No focal consolidation. IMPRESSION: Chronic cardiomegaly. Pulmonary venous hypertension, possibly with mild interstitial edema. Electronically Signed   By: Nelson Chimes M.D.   On: 05/06/2022 14:03   CUP PACEART REMOTE DEVICE CHECK  Result Date: 04/14/2022 Scheduled remote reviewed. Normal device function.  Next remote 91 days- JJB   Labs: BNP (last 3 results) No results for input(s): "BNP" in the last 8760 hours. Basic Metabolic Panel: Recent Labs  Lab 05/06/22 1435 05/07/22 0744 05/08/22 0323  NA 143 137 138  K 5.4* 3.8 4.6  CL 100 96* 98  CO2 _0 GLUCOSE 261* 108* 86  BUN 34* 15 25*  CREATININE 8.62* 5.32* 7.03*  CALCIUM 9.8 8.8* 8.8*   Liver Function Tests: Recent Labs  Lab 05/06/22 1435  AST 19  ALT 22  ALKPHOS 95  BILITOT 0.3  PROT 6.4*  ALBUMIN 3.5   No results for input(s): "LIPASE", "AMYLASE" in the last 168 hours. No results for input(s): "AMMONIA" in the last 168 hours. CBC: Recent Labs  Lab 05/06/22 1435 05/07/22 0136 05/08/22 0323  WBC 8.2 8.4 6.5  NEUTROABS 6.3  --   --   HGB 10.4* 10.9* 9.9*  HCT 33.1* 32.7* 30.9*  MCV 109.2* 104.8* 106.9*  PLT 137* 128* 141*   Cardiac Enzymes: No results for input(s): "CKTOTAL", "CKMB", "CKMBINDEX", "TROPONINI" in the last 168 hours. BNP: Invalid input(s): "POCBNP" CBG: Recent Labs  Lab 05/07/22 1113 05/07/22 1635 05/07/22 2210 05/08/22 0751 05/08/22 1507  GLUCAP 109* 97 147* 95 87   D-Dimer No results for input(s): "DDIMER" in the last 72 hours. Hgb A1c No results for input(s): "HGBA1C" in the last 72 hours. Lipid Profile No results for input(s): "CHOL", "HDL", "LDLCALC", "TRIG", "CHOLHDL", "LDLDIRECT" in the last 72 hours. Thyroid function studies No results for input(s): "TSH", "T4TOTAL", "T3FREE", "THYROIDAB" in the last 72 hours.  Invalid input(s): "FREET3" Anemia work up No results  for input(s): "VITAMINB12", "FOLATE", "FERRITIN", "TIBC", "IRON", "RETICCTPCT" in the last 72 hours. Urinalysis    Component Value Date/Time   COLORURINE YELLOW 04/07/2019 2001   APPEARANCEUR CLOUDY (A) 04/07/2019 2001   LABSPEC 1.015 04/07/2019 2001   PHURINE 5.0 04/07/2019 2001   GLUCOSEU 50 (A) 04/07/2019 2001   GLUCOSEU NEGATIVE 03/25/2017 1309   HGBUR MODERATE (A) 04/07/2019 2001   BILIRUBINUR NEGATIVE 04/07/2019 2001   BILIRUBINUR negative 04/04/2015 1229  BILIRUBINUR neg 02/27/2015 [place] 04/07/2019 2001   PROTEINUR >=300 (A) 04/07/2019 2001   UROBILINOGEN 0.2 03/25/2017 1309   NITRITE NEGATIVE 04/07/2019 2001   LEUKOCYTESUR LARGE (A) 04/07/2019 2001   Sepsis Labs Recent Labs  Lab 05/06/22 1435 05/07/22 0136 05/08/22 0323  WBC 8.2 8.4 6.5   Microbiology Recent Results (from the past 240 hour(s))  MRSA Next Gen by PCR, Nasal     Status: None   Collection Time: 05/07/22  1:55 AM   Specimen: Nasal Mucosa; Nasal Swab  Result Value Ref Range Status   MRSA by PCR Next Gen NOT DETECTED NOT DETECTED Final    Comment: Performed at [facility], [place] [address], [place], [place] [address]   Time coordinating discharge: 25 minutes  SIGNED: Antonieta Pert, MD  [facility] 05/08/2022, 4:49 PM  If 7PM-7AM, please contact night-coverage [email or web address]

## 2022-05-08 NOTE — Progress Notes (Signed)
[facility] Progress Note  Name: Rodney Farley MRN: [id number] DOB: 22-Feb-1932  Chief Complaint:  Chest pain   Subjective:  Rodney Farley had cath with angioplasty on 10/26 with cardiology.  Note there are limited options available for him from a cardiac standpoint.    Seen and examined on dialysis.  Blood pressure 119/50  and HR 80.  RAVF in use.  Procedure supervised.  Tolerating goal.    ------ Background on consult:  Rodney Farley is a 88 y.o. male with a history of ESRD, CAD, ischemic cardiomyopathy, HLD, T2DM, HTN, and severe COPD who presented to the ER with chest pain and shortness of breath.  The pain caused him to miss HD today.  Rodney Farley reports a shortened HD treatment on Monday.  Cardiology has been consulted and plans for cath tomorrow.  Rodney Farley states that Rodney Farley feels better now - breathing and chest pain are better, "I feel like a fraud because I feel better now".  Rodney Farley states that Rodney Farley doesn't want to be any trouble and I let him know that Rodney Farley has had legitimate angina and CAD.  Rodney Farley has been on midodrine pre-HD and I let him know that I'll order this.  Rodney Farley thinks that Rodney Farley can pull off 2kg without an issue.  Cardiology is concerned about restenosis of a stent.    No intake or output data in the 24 hours ending 05/08/22 1046   Vitals:  Vitals:   05/08/22 0121 05/08/22 0529 05/08/22 1011 05/08/22 1036  BP: (!) 93/41 122/68 (!) 109/51 (!) 101/46  Pulse: 64 75 81 81  Resp: 20 20 20 18   Temp: 98.4 F (36.9 C) 98.3 F (36.8 C) 98.1 F (36.7 C)   TempSrc: Oral Oral Oral   SpO2: 92% 94% 99%   Weight:      Height:         Physical Exam:   General: elderly male in stretcher in NAD at rest HEENT: NCAT Eyes: EOMI sclera anicteric Neck: supple trachea midline  Heart: S1S2 no rub Lungs: clear and unlabored Abdomen: soft/nt/nd Extremities: trace edema lower extremities Skin: no rash on extremities exposed Psych normal mood and affect Access: right AVF in  use  Medications reviewed   Labs:     Latest Ref Rng & Units 05/08/2022    3:23 AM 05/07/2022    7:44 AM 05/06/2022    2:35 PM  BMP  Glucose 70 - 99 mg/dL 86  108  261   BUN 8 - 23 mg/dL 25  15  34   Creatinine 0.61 - 1.24 mg/dL 7.03  5.32  8.62   Sodium 135 - 145 mmol/L 138  137  143   Potassium 3.5 - 5.1 mmol/L 4.6  3.8  5.4   Chloride 98 - 111 mmol/L 98  96  100   CO2 22 - 32 mmol/L 30  30  29    Calcium 8.9 - 10.3 mg/dL 8.8  8.8  9.8      Assessment/Plan:   Chest pain with CAD and unstable angina - s/p heart cath; per charting limited options available for future interventions - complex case - appreciate cardiology - midodrine with HD per prior regimen   ESRD  - HD per MWF schedule    HTN  - controlled   Anemia of CKD  - Hb 10.9. no ESA in setting of unstable angina    Metabolic bone disease  - resume home binders - on renvela   Disposition per cardiology and  primary team.  Per team Rodney Farley is to be discharged today   Claudia Desanctis, MD 05/08/2022 10:55 AM

## 2022-05-08 NOTE — TOC Initial Note (Signed)
Transition of Care [facility]) - Initial/Assessment Note    Patient Details  Name: Rodney Farley MRN: [id number] Date of Birth: 12-23-1931  Transition of Care [facility]) CM/SW Contact:    Bethena Roys, RN Phone Number: 05/08/2022, 4:05 PM  Clinical Narrative: Risk for readmission assessment completed. PTA patient was active with [facility] for PT/OT. Resumption orders obtained and start of care to begin within 24-48 hours post transition home. Patient has DME: rolling walker, rollator, tub bench. No DME needs identified at this time. Patient to transition home today.                   Expected Discharge Plan: [place] Barriers to Discharge: No Barriers Identified   Patient Goals and CMS Choice     Choice offered to / list presented to : NA  Expected Discharge Plan and Services Expected Discharge Plan: [place]   Discharge Planning Services: CM Consult Post Acute Care Choice: Home Health, Resumption of Svcs/PTA Provider Living arrangements for the past 2 months: Single Family Home Expected Discharge Date: 05/08/22                 DME Agency: NA       HH Arranged: PT, OT HH Agency: [place] Date HH Agency Contacted: 05/08/22 Time HH Agency Contacted: 1605 Representative spoke with at [place]: Claiborne Billings  Prior Living Arrangements/Services Living arrangements for the past 2 months: [place] with:: Self Patient language and need for interpreter reviewed:: Yes Do you feel safe going back to the place where you live?: Yes      Need for Family Participation in Patient Care: Yes (Comment) Care giver support system in place?: Yes (comment)   Criminal Activity/Legal Involvement Pertinent to Current Situation/Hospitalization: No - Comment as needed  Activities of Daily Living Home Assistive Devices/Equipment: Eyeglasses, Hearing aid ADL Screening (condition at time of  admission) Patient's cognitive ability adequate to safely complete daily activities?: Yes Is the patient deaf or have difficulty hearing?: Yes Does the patient have difficulty seeing, even when wearing glasses/contacts?: Yes Does the patient have difficulty concentrating, remembering, or making decisions?: No Patient able to express need for assistance with ADLs?: Yes Does the patient have difficulty dressing or bathing?: No Independently performs ADLs?: Yes (appropriate for developmental age) Does the patient have difficulty walking or climbing stairs?: No Weakness of Legs: Both Weakness of Arms/Hands: None  Permission Sought/Granted Permission sought to share information with : Family Supports, Customer service manager, Case Optician, dispensing granted to share information with : Yes, Verbal Permission Granted     Permission granted to share info w AGENCY: [facility]   Emotional Assessment Appearance:: Appears stated age       Alcohol / Substance Use: Not Applicable Psych Involvement: No (comment)  Admission diagnosis:  Elevated troponin [R79.89] End stage renal disease on dialysis ([place]) [N18.6, Z99.2] Angina pectoris ([place]) [I20.9] Chest pain [R07.9] Patient Active Problem List   Diagnosis Date Noted   Chest pain 05/06/2022   Unstable angina ([place]) 03/30/2022   Tremor 03/30/2022   Acute on chronic systolic CHF (congestive heart failure) ([place]) 01/12/2022   Aortic stenosis 12/24/2021   Hypotension 12/24/2021   Secondary hypoparathyroidism 12/24/2021   NSTEMI (non-ST elevated myocardial infarction) ([place]) 12/23/2021   History of TIA (transient ischemic attack)    History of non-ST elevation myocardial infarction (NSTEMI) 11/11/2021   Atrial fibrillation ([place]) - SCAF 08/26/2021   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 07/29/2020   Dependence on renal dialysis ([place]) 11/13/2019   Coagulation defect, unspecified ([place]) 05/11/2019    Thrombocytopenia ([place]) 04/13/2019   Hypokalemia 04/12/2019   Anaphylactic shock, unspecified, initial encounter 04/10/2019   Anemia in chronic kidney disease 20/04/711   Complication of vascular dialysis catheter 04/10/2019   Iron deficiency anemia, unspecified 04/10/2019   Secondary hyperparathyroidism of renal origin ([place]) 04/10/2019   Hyperkalemia, diminished renal excretion 04/01/2019   Hyperlipidemia 04/13/2018   Non-ST elevation (NSTEMI) myocardial infarction ([place]) 10/15/2017   S/P cholecystectomy 10/13/2017   Diarrhea 05/11/2017   Marital stress 02/18/2017   BPH associated with nocturia 04/27/2016   Diabetic peripheral neuropathy (HCC)    COPD (chronic obstructive pulmonary disease) ([place])    Bell's palsy    Gout 06/26/2015   Diverticulitis 04/08/2015   DOE (dyspnea on exertion), due to significant CAD 03/28/2015   ESRD on hemodialysis ([employer]) 03/04/2015   Angina pectoris ([place]) 10/16/2014   Coronary artery disease 09/13/2014   GERD (gastroesophageal reflux disease) 08/07/2012   Major depression, recurrent, full remission ([place]) 08/05/2012   Sleep apnea 08/05/2012   Type II diabetes mellitus ([place]) 08/05/2012   Hypertension 08/05/2012   Depression 08/05/2012   Ischemic cardiomyopathy 11/03/2011   Atrioventricular block, complete (HCC)    Obesity (BMI 30-39.9)    Pacemaker    PCP:  [name], MD Pharmacy:   [place], [address] [place] [address] Phone: [phone] Fax: [place] [address] [place] [address] Phone: [phone] Fax: [phone]  Readmission Risk Interventions    05/08/2022    4:03 PM 04/02/2022    3:35 PM  Readmission Risk Prevention Plan  Transportation Screening Complete Complete  Medication Review ([place]) Complete Complete  PCP or Specialist appointment within 3-5 days of discharge  Complete   HRI or [place] Complete Complete  SW Recovery Care/Counseling Consult Complete Complete  Palliative Care Screening Not Applicable Not [place] Not Applicable Not Applicable

## 2022-05-08 NOTE — Telephone Encounter (Signed)
On your desk Rodney Farley

## 2022-05-08 NOTE — Telephone Encounter (Signed)
Pharmacy Patient Advocate Encounter  Insurance verification completed.    The patient is insured through [employer] Part D   The patient is currently admitted and ran test claims for the following: Brilinta.  Copays and coinsurance results were relayed to Inpatient clinical team.  

## 2022-05-08 NOTE — TOC Benefit Eligibility Note (Signed)
Patient Teacher, English as a foreign language completed.    The patient is currently admitted and upon discharge could be taking Brilinta 90 mg.  The current 30 day co-pay is $45.00.   The patient is insured through [place] Patient Advocate Specialist [place] Patient Advocate Team Direct Number: [phone]  Fax: [phone]

## 2022-05-08 NOTE — Progress Notes (Signed)
CARDIAC REHAB PHASE I    Post cath education including risk factors, site care, exercise guidelines, heart healthy diet, antiplatelet therapy importance, restrictions, MI booklet and CRP2 reviewed. All questions and concerns addressed. Will place referral to [facility] for CRP2, however pt is not interested at this time. Plan for discharge home today.    [id number]  Vanessa Barbara, RN BSN 05/08/2022 2:51 PM

## 2022-05-08 NOTE — Plan of Care (Signed)
  Problem: Education: Goal: Understanding of CV disease, CV risk reduction, and recovery process will improve Outcome: Progressing Goal: Individualized Educational Video(s) Outcome: Progressing   Problem: Activity: Goal: Ability to return to baseline activity level will improve Outcome: Progressing   Problem: Cardiovascular: Goal: Ability to achieve and maintain adequate cardiovascular perfusion will improve Outcome: Progressing Goal: Vascular access site(s) Level 0-1 will be maintained Outcome: Progressing   Problem: Health Behavior/Discharge Planning: Goal: Ability to safely manage health-related needs after discharge will improve Outcome: Progressing   Problem: Education: Goal: Ability to describe self-care measures that may prevent or decrease complications (Diabetes Survival Skills Education) will improve Outcome: Progressing Goal: Individualized Educational Video(s) Outcome: Progressing   Problem: Coping: Goal: Ability to adjust to condition or change in health will improve Outcome: Progressing   Problem: Fluid Volume: Goal: Ability to maintain a balanced intake and output will improve Outcome: Progressing   Problem: Health Behavior/Discharge Planning: Goal: Ability to identify and utilize available resources and services will improve Outcome: Progressing Goal: Ability to manage health-related needs will improve Outcome: Progressing   Problem: Metabolic: Goal: Ability to maintain appropriate glucose levels will improve Outcome: Progressing   Problem: Nutritional: Goal: Maintenance of adequate nutrition will improve Outcome: Progressing Goal: Progress toward achieving an optimal weight will improve Outcome: Progressing   Problem: Skin Integrity: Goal: Risk for impaired skin integrity will decrease Outcome: Progressing   Problem: Tissue Perfusion: Goal: Adequacy of tissue perfusion will improve Outcome: Progressing   Problem: Education: Goal: Knowledge  of General Education information will improve Description: Including pain rating scale, medication(s)/side effects and non-pharmacologic comfort measures Outcome: Progressing   Problem: Health Behavior/Discharge Planning: Goal: Ability to manage health-related needs will improve Outcome: Progressing   Problem: Clinical Measurements: Goal: Ability to maintain clinical measurements within normal limits will improve Outcome: Progressing Goal: Will remain free from infection Outcome: Progressing Goal: Diagnostic test results will improve Outcome: Progressing Goal: Respiratory complications will improve Outcome: Progressing Goal: Cardiovascular complication will be avoided Outcome: Progressing   Problem: Activity: Goal: Risk for activity intolerance will decrease Outcome: Progressing   Problem: Nutrition: Goal: Adequate nutrition will be maintained Outcome: Progressing   Problem: Coping: Goal: Level of anxiety will decrease Outcome: Progressing   Problem: Elimination: Goal: Will not experience complications related to bowel motility Outcome: Progressing Goal: Will not experience complications related to urinary retention Outcome: Progressing   Problem: Pain Managment: Goal: General experience of comfort will improve Outcome: Progressing   Problem: Safety: Goal: Ability to remain free from injury will improve Outcome: Progressing   Problem: Skin Integrity: Goal: Risk for impaired skin integrity will decrease Outcome: Progressing   Problem: Education: Goal: Ability to demonstrate management of disease process will improve Outcome: Progressing Goal: Ability to verbalize understanding of medication therapies will improve Outcome: Progressing Goal: Individualized Educational Video(s) Outcome: Progressing   Problem: Activity: Goal: Capacity to carry out activities will improve Outcome: Progressing   Problem: Cardiac: Goal: Ability to achieve and maintain adequate  cardiopulmonary perfusion will improve Outcome: Progressing   Problem: Education: Goal: Knowledge of cardiac device and self-care will improve Outcome: Progressing Goal: Ability to safely manage health related needs after discharge will improve Outcome: Progressing Goal: Individualized Educational Video(s) Outcome: Progressing   Problem: Cardiac: Goal: Ability to achieve and maintain adequate cardiopulmonary perfusion will improve Outcome: Progressing

## 2022-05-08 NOTE — Progress Notes (Addendum)
Rounding Note    Patient Name: Rodney Farley Date of Encounter: 05/08/2022  [place] Cardiologist: Baden [name], MD   Subjective   Feels well today. No chest pain overnight. Hopes for dialysis prior to leaving the hospital.  Inpatient Medications    Scheduled Meds:  aspirin  81 mg Oral Daily   atorvastatin  80 mg Oral Daily   carvedilol  3.125 mg Oral BID WC   Chlorhexidine Gluconate Cloth  6 each Topical Q0600   clopidogrel  75 mg Oral Q breakfast   gabapentin  300 mg Oral BID   insulin aspart  0-15 Units Subcutaneous TID WC   insulin aspart  0-5 Units Subcutaneous QHS   isosorbide mononitrate  30 mg Oral Daily   pantoprazole  40 mg Oral Daily   sevelamer carbonate  1,600 mg Oral TID WC   sodium chloride flush  3 mL Intravenous Q12H   sodium chloride flush  3 mL Intravenous Q12H   Continuous Infusions:  sodium chloride     PRN Meds: sodium chloride, acetaminophen, ondansetron (ZOFRAN) IV, polyvinyl alcohol, sodium chloride flush   Vital Signs    Vitals:   05/07/22 2101 05/07/22 2241 05/08/22 0121 05/08/22 0529  BP: (!) 86/45 (!) 104/49 (!) 93/41 122/68  Pulse: 79 83 64 75  Resp: 19 (!) 22 20 20   Temp: 98.4 F (36.9 C)  98.4 F (36.9 C) 98.3 F (36.8 C)  TempSrc: Oral  Oral Oral  SpO2: 97% 96% 92% 94%  Weight:      Height:        Intake/Output Summary (Last 24 hours) at 05/08/2022 0858 Last data filed at 05/07/2022 0900 Gross per 24 hour  Intake 0 ml  Output --  Net 0 ml       05/07/2022    1:22 AM 05/07/2022   12:30 AM 05/06/2022    8:22 PM  Last 3 Weights  Weight (lbs) 205 lb 14.4 oz 203 lb 4.2 oz 206 lb 9.1 oz  Weight (kg) 93.396 kg 92.2 kg 93.7 kg      Telemetry    Paced rhythm in 70s-80s - Personally Reviewed  ECG    Paced rhythm without evidence of ST elevation or depression - Personally Reviewed  Physical Exam   GEN: No acute distress.   Neck: No JVD Cardiac: 3/6 systolic murmur.  Respiratory: Mild end  expiratory wheeze RLL GI: Soft, nontender, non-distended. +Bowel sounds. MS: No LE edema Neuro:  Nonfocal  Psych: Pleasant. Appropriate mood and affect.  Labs    High Sensitivity Troponin:   Recent Labs  Lab 05/06/22 1435 05/06/22 1746 05/07/22 1029 05/07/22 1217  TROPONINIHS 220* 275* 1,037* 962*      Chemistry Recent Labs  Lab 05/06/22 1435 05/07/22 0744 05/08/22 0323  NA 143 137 138  K 5.4* 3.8 4.6  CL 100 96* 98  CO2 29 30 30   GLUCOSE 261* 108* 86  BUN 34* 15 25*  CREATININE 8.62* 5.32* 7.03*  CALCIUM 9.8 8.8* 8.8*  PROT 6.4*  --   --   ALBUMIN 3.5  --   --   AST 19  --   --   ALT 22  --   --   ALKPHOS 95  --   --   BILITOT 0.3  --   --   GFRNONAA 5* 10* 7*  ANIONGAP 14 11 10      Lipids No results for input(s): "CHOL", "TRIG", "HDL", "LABVLDL", "LDLCALC", "CHOLHDL" in the last 168  hours.  Hematology Recent Labs  Lab 05/06/22 1435 05/07/22 0136 05/08/22 0323  WBC 8.2 8.4 6.5  RBC 3.03* 3.12* 2.89*  HGB 10.4* 10.9* 9.9*  HCT 33.1* 32.7* 30.9*  MCV 109.2* 104.8* 106.9*  MCH 34.3* 34.9* 34.3*  MCHC 31.4 33.3 32.0  RDW 15.8* 15.6* 15.3  PLT 137* 128* 141*    Thyroid No results for input(s): "TSH", "FREET4" in the last 168 hours.  BNPNo results for input(s): "BNP", "PROBNP" in the last 168 hours.  DDimer No results for input(s): "DDIMER" in the last 168 hours.   Radiology    CARDIAC CATHETERIZATION  Result Date: 05/07/2022   Ost RCA to Prox RCA lesion is 70% stenosed.  Small vessel.   Prox Cx lesion is 30% stenosed.   Mid Cx to Dist Cx lesion is 40% stenosed.   Mid Cx lesion is 30% stenosed.   2nd Diag-1 lesion is 30% stenosed.   3rd Mrg lesion is 50% stenosed.   Non-stenotic 2nd Diag-2 lesion was previously treated.  Patent stent.   Dist LM to Ost LAD lesion is 90% stenosed.   Scoring balloon angioplasty was performed using a BALLN SCOREFLEX 3.50X10.   Post intervention, there is a 20% residual stenosis. Another episode of restenosis in the ostial  to proximal LAD.  Successful balloon angioplasty with score flex balloon at high pressure followed by [place] balloon at high pressure. Unfortunately, there are no other options at our facility.  We will need to look into availability of drug-coated balloon or brachytherapy as possible treatments if this recurs.   ECHOCARDIOGRAM COMPLETE  Result Date: 05/07/2022    ECHOCARDIOGRAM REPORT   Patient Name:   Rodney Farley Date of Exam: 05/07/2022 Medical Rec #:  [id number]             Height:       67.0 in Accession #:    [id number]            Weight:       205.9 lb Date of Birth:  06-08-1932             BSA:          2.047 m Patient Age:    88 years              BP:           122/49 mmHg Patient Gender: M                     HR:           80 bpm. Exam Location:  Inpatient Procedure: 2D Echo, Cardiac Doppler, Color Doppler and Intracardiac            Opacification Agent Indications:    R01.1 Murmur; R07.9* Chest pain, unspecified  History:        Patient has prior history of Echocardiogram examinations, most                 recent 11/11/2021. CHF, Previous Myocardial Infarction and CAD,                 Abnormal ECG, Defibrillator and Pacemaker, COPD, Aortic Valve                 Disease, Arrythmias:AV Block, Signs/Symptoms:Chest Pain,                 Hypotension, Murmur, Dyspnea and Shortness of Breath; Risk  Factors:Diabetes and Hypertension. ESRD. Aortic stenosis.  Sonographer:    Roseanna Rainbow RDCS Referring Phys: [id number] CHRISTOPHER P DANFORD  Sonographer Comments: Technically difficult study due to poor echo windows. Image acquisition challenging due to patient body habitus. IMPRESSIONS  1. Left ventricular ejection fraction, by estimation, is 45 to 50%. The left ventricle has mildly decreased function. The left ventricle demonstrates regional wall motion abnormalities (see scoring diagram/findings for description). Left ventricular diastolic parameters are indeterminate.  2. Right ventricular  systolic function is normal. The right ventricular size is normal. There is normal pulmonary artery systolic pressure.  3. The mitral valve is normal in structure. Trivial mitral valve regurgitation. No evidence of mitral stenosis. Moderate mitral annular calcification.  4. The aortic valve is normal in structure. There is mild calcification of the aortic valve. There is mild thickening of the aortic valve. Aortic valve regurgitation is trivial. Moderate aortic valve stenosis. Aortic valve area, by VTI measures 1.42 cm. Aortic valve mean gradient measures 20.0 mmHg. Aortic valve Vmax measures 2.97 m/s.  5. The inferior vena cava is normal in size with greater than 50% respiratory variability, suggesting right atrial pressure of 3 mmHg. Comparison(s): A prior study was performed on 11/2021. No significant change from prior study. FINDINGS  Left Ventricle: Left ventricular ejection fraction, by estimation, is 45 to 50%. The left ventricle has mildly decreased function. The left ventricle demonstrates regional wall motion abnormalities. Definity contrast agent was given IV to delineate the left ventricular endocardial borders. The left ventricular internal cavity size was normal in size. There is borderline concentric left ventricular hypertrophy. Left ventricular diastolic function could not be evaluated due to mitral annular calcification (moderate or greater). Left ventricular diastolic parameters are indeterminate.  LV Wall Scoring: The apical septal segment, apical inferior segment, and apex are hypokinetic. The entire anterior wall, entire lateral wall, anterior septum, inferior wall, mid inferoseptal segment, and basal inferoseptal segment are normal. Right Ventricle: The right ventricular size is normal. No increase in right ventricular wall thickness. Right ventricular systolic function is normal. There is normal pulmonary artery systolic pressure. The tricuspid regurgitant velocity is 2.12 m/s, and  with an  assumed right atrial pressure of 3 mmHg, the estimated right ventricular systolic pressure is 33.2 mmHg. Left Atrium: Left atrial size was normal in size. Right Atrium: Right atrial size was normal in size. Pericardium: There is no evidence of pericardial effusion. Presence of epicardial fat layer. Mitral Valve: The mitral valve is normal in structure. Moderate mitral annular calcification. Trivial mitral valve regurgitation. No evidence of mitral valve stenosis. Tricuspid Valve: The tricuspid valve is normal in structure. Tricuspid valve regurgitation is not demonstrated. No evidence of tricuspid stenosis. Aortic Valve: The aortic valve is normal in structure. There is mild calcification of the aortic valve. There is mild thickening of the aortic valve. There is moderate aortic valve annular calcification. Aortic valve regurgitation is trivial. Moderate aortic stenosis is present. Aortic valve mean gradient measures 20.0 mmHg. Aortic valve peak gradient measures 35.2 mmHg. Aortic valve area, by VTI measures 1.42 cm. Pulmonic Valve: The pulmonic valve was normal in structure. Pulmonic valve regurgitation is not visualized. No evidence of pulmonic stenosis. Aorta: The aortic root is normal in size and structure. Venous: The inferior vena cava is normal in size with greater than 50% respiratory variability, suggesting right atrial pressure of 3 mmHg. IAS/Shunts: No atrial level shunt detected by color flow Doppler. Additional Comments: A device lead is visualized.  LEFT VENTRICLE PLAX 2D LVIDd:  5.70 cm      Diastology LVIDs:         4.20 cm      LV e' medial:    5.66 cm/s LV PW:         1.20 cm      LV E/e' medial:  20.1 LV IVS:        1.30 cm      LV e' lateral:   7.25 cm/s LVOT diam:     2.30 cm      LV E/e' lateral: 15.7 LV SV:         98 LV SV Index:   48 LVOT Area:     4.15 cm  LV Volumes (MOD) LV vol d, MOD A2C: 153.0 ml LV vol d, MOD A4C: 164.0 ml LV vol s, MOD A2C: 110.0 ml LV vol s, MOD A4C: 119.0  ml LV SV MOD A2C:     43.0 ml LV SV MOD A4C:     164.0 ml LV SV MOD BP:      38.5 ml RIGHT VENTRICLE            IVC RV S prime:     6.65 cm/s  IVC diam: 2.70 cm TAPSE (M-mode): 2.3 cm LEFT ATRIUM             Index        RIGHT ATRIUM           Index LA diam:        4.70 cm 2.30 cm/m   RA Area:     22.20 cm LA Vol (A2C):   54.2 ml 26.47 ml/m  RA Volume:   68.20 ml  33.31 ml/m LA Vol (A4C):   48.9 ml 23.88 ml/m LA Biplane Vol: 53.2 ml 25.98 ml/m  AORTIC VALVE AV Area (Vmax):    1.46 cm AV Area (Vmean):   1.39 cm AV Area (VTI):     1.42 cm AV Vmax:           296.60 cm/s AV Vmean:          208.400 cm/s AV VTI:            0.696 m AV Peak Grad:      35.2 mmHg AV Mean Grad:      20.0 mmHg LVOT Vmax:         104.00 cm/s LVOT Vmean:        69.800 cm/s LVOT VTI:          0.237 m LVOT/AV VTI ratio: 0.34  AORTA Ao Root diam: 3.20 cm Ao Asc diam:  3.50 cm MITRAL VALVE                TRICUSPID VALVE MV Area (PHT): 3.85 cm     TR Peak grad:   18.0 mmHg MV Decel Time: 197 msec     TR Vmax:        212.00 cm/s MV E velocity: 114.00 cm/s MV A velocity: 102.05 cm/s  SHUNTS MV E/A ratio:  1.12         Systemic VTI:  0.24 m                             Systemic Diam: 2.30 cm Kardie Tobb DO Electronically signed by Berniece Salines DO Signature Date/Time: 05/07/2022/10:51:40 AM    Final    DG Chest Portable 1 View  Result Date: 05/06/2022 CLINICAL DATA:  Shortness  of breath which is worsening EXAM: PORTABLE CHEST 1 VIEW COMPARISON:  03/30/2022 FINDINGS: Chronic cardiomegaly. Pacemaker/AICD appears grossly unchanged. Pulmonary venous hypertension, possibly with mild interstitial edema. No pleural effusion. No focal consolidation. IMPRESSION: Chronic cardiomegaly. Pulmonary venous hypertension, possibly with mild interstitial edema. Electronically Signed   By: Nelson Chimes M.D.   On: 05/06/2022 14:03    Cardiac Studies  LHC (03/31/2022):   Ost RCA to Prox RCA lesion is 70% stenosed.   Prox Cx lesion is 30% stenosed.   Mid Cx  to Dist Cx lesion is 40% stenosed.   Mid Cx lesion is 30% stenosed.   2nd Diag-1 lesion is 30% stenosed.   3rd Mrg lesion is 50% stenosed.   Non-stenotic 2nd Diag-2 lesion was previously treated.   Dist LM to Ost LAD lesion is 90% stenosed. In stent restenosis.   Scoring balloon angioplasty was performed using a BALLN [place] 3.50X10, followed by PTCA with 4.0 [place] balloon.   Post intervention, there is a 20% residual stenosis.     RHC (02/09/2022): Right Heart Pressures RA (mean): 14 mmHg RV (S/EDP): 57/15 mmHg PA (S/D, mean): 57/30 (39) mmHg PCWP (mean): 33 mmHg  Ao sat: 92% PA sat: 72%  Fick CO: 10.6 L/min Fick CI: 5.0 L/min/m^2  Thermodilution CO: 6.5 L/min Thermodilution CI: 3.1 L/min/m^2    Echo (05/07/2022):  1. Left ventricular ejection fraction, by estimation, is 45 to 50%. The  left ventricle has mildly decreased function. The left ventricle  demonstrates regional wall motion abnormalities (see scoring  diagram/findings for description). Left ventricular  diastolic parameters are indeterminate.   2. Right ventricular systolic function is normal. The right ventricular  size is normal. There is normal pulmonary artery systolic pressure.   3. The mitral valve is normal in structure. Trivial mitral valve  regurgitation. No evidence of mitral stenosis. Moderate mitral annular  calcification.   4. The aortic valve is normal in structure. There is mild calcification  of the aortic valve. There is mild thickening of the aortic valve. Aortic  valve regurgitation is trivial. Moderate aortic valve stenosis. Aortic  valve area, by VTI measures 1.42  cm. Aortic valve mean gradient measures 20.0 mmHg. Aortic valve Vmax  measures 2.97 m/s.   5. The inferior vena cava is normal in size with greater than 50%  respiratory variability, suggesting right atrial pressure of 3 mmHg.     Patient Profile     88 y.o. male with history of HFrEF s/p CRT-D, ESRD on HD MWF, and  multi-vessel CAD s/p PCI to LAD, 2nd diag, and OM 3 in 0623, complicated by several episodes of angina due to in-stent restenosis of ostial LAD lesion, who presents with accelerated angina and elevated troponin concerning for UA versus NSTEMI.  Assessment & Plan   Principal Problem:   Chest pain Active Problems:   Obesity (BMI 30-39.9)   Pacemaker   Sleep apnea   Type II diabetes mellitus (HCC)   Angina pectoris (HCC)   ESRD on hemodialysis (HCC)   COPD (chronic obstructive pulmonary disease) (HCC)   Hyperlipidemia   Hyperkalemia, diminished renal excretion   Anemia in chronic kidney disease   Atrial fibrillation (HCC) - SCAF   Aortic stenosis   Hypotension  UA versus NSTEMI No angina since procedure. S/p PCI to ostial LAD with scoring balloon angioplasty with 20% residual stenosis. Will discontinue Plavix in favor of Brilinta today, perhaps this may help prevent restenosis of the ostial LAD. -Imdur 30 mg daily -Start Brilinta -Carvedilol  3.125 mg BID  Ischemic cardiomyopathy Chronic systolic CHF NYHA III Euvolemic. CRT-D. Carvedilol as above. Appreciate nephrology assistance with dialysis for volume management. -Dialysis before discharge  Afib Subclinical atrial fibrillation noted on previous device interrogation, not on anticoagulation PTA given low Afib burden  ESRD on HD MWF -Dialysis before discharge  For questions or updates, please contact [place] Please consult [email or web address] for contact info under        Signed, Nani Gasser, MD  05/08/2022, 8:58 AM     Patient seen and examined. Agree with assessment and plan.  Angiograms reviewed.  Patient presented with recurrent anginal symptomatology and was found to have at least 90% recurrent in-stent restenosis of the ostial LAD stent.  He underwent successful intervention with scoring balloon and noncompliant balloon dilatation.  Since patient has had now for restenosis ease at this site since his May  stent, will change Plavix to Brilinta.  Also consider possible future brachytherapy if there is restenosis.  The patient is against consideration for LIMA graft to LAD.  Plan for him to have dialysis today prior to discharge.  With LDL 64 on atorvastatin 80 mg, will add Zetia 10 mg to statin therapy and attempt to get LDL less than 50.  Patient will follow-up with Dr. [name].   Troy Sine, MD, [facility] 05/08/2022 10:25 AM

## 2022-05-08 NOTE — Telephone Encounter (Signed)
Forms have been faxed back.  

## 2022-05-08 NOTE — Progress Notes (Signed)
D/C order noted. Contacted [facility] to advise clinic of pt's d/c today and that pt will resume care on Monday.   Melven Sartorius Renal Navigator [phone]

## 2022-05-09 ENCOUNTER — Telehealth: Payer: Self-pay | Admitting: Nephrology

## 2022-05-09 NOTE — Telephone Encounter (Signed)
Transition of care contact from inpatient facility  Date of discharge: 05/08/22 Date of contact:  05/09/22 Method: Phone Spoke to: Patient  Patient contacted to discuss transition of care from recent inpatient hospitalization. Patient was admitted to [facility] from 10/25 -05/08/22 with discharge diagnosis of chest pain secondary to in-stent restenosis.   Medication changes were reviewed. No concerns today.   Patient will follow up with his/her outpatient HD unit on: Monday Oct 30.

## 2022-05-11 ENCOUNTER — Ambulatory Visit (HOSPITAL_COMMUNITY): Payer: PPO

## 2022-05-11 ENCOUNTER — Telehealth: Payer: Self-pay

## 2022-05-11 NOTE — Telephone Encounter (Signed)
Transition Care Management Unsuccessful Follow-up Telephone Call  Date of discharge and from where:  [facility] 05/08/2022  Attempts:  1st Attempt  Reason for unsuccessful TCM follow-up call:  Left voice message Juanda Crumble, [place] Direct Dial [phone]

## 2022-05-12 NOTE — Telephone Encounter (Signed)
Transition Care Management Follow-up Telephone Call Date of discharge and from where: [facility] 05/08/2022 How have you been since you were released from the hospital? good Any questions or concerns? No  Items Reviewed: Did the pt receive and understand the discharge instructions provided? Yes  Medications obtained and verified? Yes  Other? No  Any new allergies since your discharge? No  Dietary orders reviewed? Yes Do you have support at home? Yes   Home Care and Equipment/Supplies: Were home health services ordered? no If so, what is the name of the agency? N/a  Has the agency set up a time to come to the patient's home? not applicable Were any new equipment or medical supplies ordered?  No What is the name of the medical supply agency? N/a Were you able to get the supplies/equipment? no Do you have any questions related to the use of the equipment or supplies? no  Functional Questionnaire: (I = Independent and D = Dependent) ADLs: I  Bathing/Dressing- I  Meal Prep- I  Eating- I  Maintaining continence- D  Transferring/Ambulation- I  Managing Meds- I  Follow up appointments reviewed:  PCP Hospital f/u appt confirmed? Yes  Scheduled to see Dr Yong Channel on 05/19/2022 @ 1:00. [facility] f/u appt confirmed? No   Are transportation arrangements needed? No  If their condition worsens, is the pt aware to call PCP or go to the Emergency Dept.? Yes Was the patient provided with contact information for the PCP's office or ED? Yes Was to pt encouraged to call back with questions or concerns? Yes Juanda Crumble, LPN [place] Direct Dial [phone]

## 2022-05-12 NOTE — Telephone Encounter (Signed)
Noted thanks °

## 2022-05-13 ENCOUNTER — Ambulatory Visit (HOSPITAL_COMMUNITY): Payer: PPO

## 2022-05-15 ENCOUNTER — Other Ambulatory Visit (HOSPITAL_COMMUNITY): Payer: Self-pay

## 2022-05-18 ENCOUNTER — Other Ambulatory Visit (HOSPITAL_COMMUNITY): Payer: Self-pay

## 2022-05-18 ENCOUNTER — Telehealth: Payer: Self-pay | Admitting: Family Medicine

## 2022-05-18 ENCOUNTER — Encounter (HOSPITAL_COMMUNITY): Payer: Self-pay

## 2022-05-18 NOTE — Telephone Encounter (Signed)
See below

## 2022-05-18 NOTE — Telephone Encounter (Signed)
Orders Requests Caller States: -pt previously had skilled nursing, but is not currently receiving it after being released from Hospital. -She thinks patient would benefit from a nursing evaluation for "medical condition management"   Caller Requests: -Orders to be faxed to [facility] at [place] Verbal Orders  Agency:   [facility]   Requesting OT/ PT/ Skilled nursing/ Social Work/ Speech:   PT  Reason for Request:   Results of Evaluation  Frequency: 1x/week for 4 weeks

## 2022-05-18 NOTE — Telephone Encounter (Signed)
You may provide verbal orders- looks like has visit tomorrow on Tuesday though so you could also pend amb refer to home health with those details included

## 2022-05-19 ENCOUNTER — Encounter: Payer: Self-pay | Admitting: Physician Assistant

## 2022-05-19 ENCOUNTER — Telehealth (HOSPITAL_COMMUNITY): Payer: Self-pay

## 2022-05-19 ENCOUNTER — Ambulatory Visit (INDEPENDENT_AMBULATORY_CARE_PROVIDER_SITE_OTHER): Payer: PPO | Admitting: Family Medicine

## 2022-05-19 ENCOUNTER — Encounter: Payer: Self-pay | Admitting: Family Medicine

## 2022-05-19 ENCOUNTER — Ambulatory Visit: Payer: PPO | Attending: Physician Assistant | Admitting: Physician Assistant

## 2022-05-19 VITALS — BP 124/56 | HR 81 | Ht 67.0 in | Wt 213.4 lb

## 2022-05-19 VITALS — BP 112/60 | HR 86 | Temp 98.7°F | Ht 67.0 in | Wt 214.2 lb

## 2022-05-19 DIAGNOSIS — Z992 Dependence on renal dialysis: Secondary | ICD-10-CM

## 2022-05-19 DIAGNOSIS — E1122 Type 2 diabetes mellitus with diabetic chronic kidney disease: Secondary | ICD-10-CM | POA: Diagnosis not present

## 2022-05-19 DIAGNOSIS — E119 Type 2 diabetes mellitus without complications: Secondary | ICD-10-CM

## 2022-05-19 DIAGNOSIS — N186 End stage renal disease: Secondary | ICD-10-CM

## 2022-05-19 DIAGNOSIS — I10 Essential (primary) hypertension: Secondary | ICD-10-CM

## 2022-05-19 DIAGNOSIS — E785 Hyperlipidemia, unspecified: Secondary | ICD-10-CM

## 2022-05-19 DIAGNOSIS — E1142 Type 2 diabetes mellitus with diabetic polyneuropathy: Secondary | ICD-10-CM

## 2022-05-19 DIAGNOSIS — I950 Idiopathic hypotension: Secondary | ICD-10-CM

## 2022-05-19 DIAGNOSIS — I2510 Atherosclerotic heart disease of native coronary artery without angina pectoris: Secondary | ICD-10-CM

## 2022-05-19 DIAGNOSIS — R251 Tremor, unspecified: Secondary | ICD-10-CM

## 2022-05-19 DIAGNOSIS — I255 Ischemic cardiomyopathy: Secondary | ICD-10-CM

## 2022-05-19 DIAGNOSIS — I25118 Atherosclerotic heart disease of native coronary artery with other forms of angina pectoris: Secondary | ICD-10-CM

## 2022-05-19 DIAGNOSIS — I4891 Unspecified atrial fibrillation: Secondary | ICD-10-CM

## 2022-05-19 MED ORDER — GABAPENTIN 100 MG PO CAPS: ORAL_CAPSULE | ORAL | 0 refills | Status: DC

## 2022-05-19 NOTE — Assessment & Plan Note (Signed)
S:Rodney Farley had seen Dr. Posey Pronto July 2022 and gabapentin was recommended-other options were limited by dialysis with next potential step being opioids-both family and patient are cautious about this especially with him living alone.  Also had been on Lyrica in the past but had hallucinations  Currently on Gabapentin 300mg  on med list- started on Saturday a little while back and  within 2-3 days on Tuesday- intense tremors.  Rodney Farley has trialed off of this and tremors resolve-when Rodney Farley has been off did not note worsening pain.   A/P: Reasonable control of neuropathy but tremors are intolerable and have resolved in the past when stopped gabapentin-Rodney Farley would like to trial off medication and hold off on opioid treatment-see prescription but we created a wean for him

## 2022-05-19 NOTE — Telephone Encounter (Signed)
Called and lm on [place] confidential VM with VO.

## 2022-05-19 NOTE — Assessment & Plan Note (Signed)
Has been off the Lantus for some time after starting hemodialysis.  Last A1c slightly high at 7.5 on record but he reports was as low as 6.1 more recently with dialysis-he is going to get [place] a copy of this-I will set a reminder to check on this in 3 weeks.  For now continue diet control only -he will also have records sent to [place] from Dr. Katy Fitch when he has his updated diabetic eye exam later this month

## 2022-05-19 NOTE — Progress Notes (Signed)
Phone [phone]   Subjective:  Rodney Farley is a 88 y.o. year old very pleasant male patient who presents for transitional care management and hospital follow up for angina. Patient was hospitalized from 05/06/2022 to 05/08/2022. A TCM phone call was completed on 05/12/2022. Medical complexity moderate  88 year old medically complex patient with ESRD on hemodialysis Monday Wednesday Friday, CHF with AICD, complete heart block with pacemaker, diabetes, hypertension, chronic hypotension on midodrine, atrial fibrillation, coronary artery disease with PCI admitted with angina.  Concern for unstable angina versus NSTEMI and patient was treated with heparin drip.  Troponin elevated up to 200.  On the 26th he had a cardiac catheterization and found to have another episode of restenosis in the ostial to proximal LAD with successful balloon angioplasty-there were no options available beyond this but there were some more advanced techniques that cardiology mentioned investigating further.  He was on aspirin and Plavix at time of hospitalization he was changed to Brilinta and aspirin.  Zetia was added to try to push LDL below 50.  He was discharged on carvedilol 3.125 mg twice daily and Imdur 15 mg.  Carvedilol was to be held for low blood pressure or bradycardia.  He was also continued on Renvela. 5th cath this year  For ESRD he did complete dialysis in the hospital.  His CHF was managed by dialysis.  Baseline anemia of chronic disease/CKD with hemoglobin stable around 10.  Did have mild thrombocytopenia noted as well  Patient typically has about 1 episode a day of angina but on the day of admission had 3 episodes so went into the emergency department to be evaluated-pain resolved by time of the ED visit.  Found to have hyperkalemia with potassium up to 5.4 and patient had only been able to tolerate an hour of dialysis on Monday prior due to excruciating foot cramps-therefore was treated with  Lokelma.  For his cholesterol he was continued on Repatha every 14 days at 140 mg dose which was started by cardiology- on 3rd dose this upcoming Friday  Patient has certainly lost some strength with hospitalization-she is working with home health PT OT.  We received a message yesterday from nursing who request being restarted.  Before hospitalization patient has been doing reasonably well.  Still driving to [place] in [place] roads and not busy roads like [employer].  Has not had issues.  Still independent other than needing a housemaid.[name]  He has-good support- helpful neighbors and 2 blocks from 73 and daughter in law  He had seen Dr. Posey Pronto July 2022 and gabapentin was recommended-other options were limited by dialysis with next potential step being opioids-both family and patient are cautious about this especially with him living alone.  Also had been on Lyrica in the past but had hallucinations  Currently on Gabapentin 330m on med list- started on Saturday a little while back and  within 2-3 days on Tuesday- intense tremors.  He has trialed off of this and tremors resolve-when he has been off did not note worsening pain.   He is not currently on any medication for diabetes and for the most part has been diet controlled-reports A1c was 6.1 with dialysis more recently.  He also sees Dr. GKaty Fitchat the end of the month for diabetic eye exam Lab Results  Component Value Date   HGBA1C 7.5 (H) 11/11/2021   HGBA1C 6.3 08/07/2020   HGBA1C 6.7 10/27/2019    See problem oriented charting as well  Past Medical History-  Patient Active  Problem List   Diagnosis Date Noted   Atrial fibrillation ([place]) - SCAF 08/26/2021    Priority: High   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 07/29/2020    Priority: High   COPD (chronic obstructive pulmonary disease) ([place])     Priority: High   DOE (dyspnea on exertion), due to significant CAD 03/28/2015    Priority: High   ESRD on  hemodialysis ([place]) 03/04/2015    Priority: High   Coronary artery disease 09/13/2014    Priority: High   Major depression, recurrent, full remission ([place]) 08/05/2012    Priority: High   Type II diabetes mellitus ([place]) 08/05/2012    Priority: High   Ischemic cardiomyopathy 11/03/2011    Priority: High   Atrioventricular block, complete ([place])     Priority: High   Pacemaker     Priority: High   Aortic stenosis 12/24/2021    Priority: Medium    Hypotension 12/24/2021    Priority: Medium    History of TIA (transient ischemic attack)     Priority: Medium    Anemia in chronic kidney disease 04/10/2019    Priority: Medium    Hyperlipidemia 04/13/2018    Priority: Medium    BPH associated with nocturia 04/27/2016    Priority: Medium    Diabetic peripheral neuropathy ([place])     Priority: Medium    Bell's palsy     Priority: Medium    Gout 06/26/2015    Priority: Medium    Sleep apnea 08/05/2012    Priority: Medium    Hypertension 08/05/2012    Priority: Medium    Depression 08/05/2012    Priority: Medium    S/P cholecystectomy 10/13/2017    Priority: Low   Marital stress 02/18/2017    Priority: Low   Diverticulitis 04/08/2015    Priority: Low   GERD (gastroesophageal reflux disease) 08/07/2012    Priority: Low   Obesity (BMI 30-39.9)     Priority: Low   Tremor 03/30/2022   Thrombocytopenia ([place]) 04/13/2019   Hypokalemia 04/12/2019   Anaphylactic shock, unspecified, initial encounter 04/10/2019   Iron deficiency anemia, unspecified 04/10/2019   Secondary hyperparathyroidism of renal origin ([place]) 04/10/2019   Diarrhea 05/11/2017    Medications- reviewed and updated  A medical reconciliation was performed comparing current medicines to hospital discharge medications. Current Outpatient Medications  Medication Sig Dispense Refill   aspirin EC 81 MG tablet Take 81 mg by mouth daily.     atorvastatin (LIPITOR) 80 MG tablet Take 1 tablet by mouth daily. 90 tablet 3    calcitRIOL (ROCALTROL) 0.25 MCG capsule Take 7 capsules (1.75 mcg total) by mouth every Monday, Wednesday, and Friday with hemodialysis. 100 capsule 3   carbamide peroxide (DEBROX) 6.5 % OTIC solution Place 5 drops into both ears 2 (two) times daily as needed (earwax).     Carboxymethylcellul-Glycerin (LUBRICATING EYE DROPS OP) Place 1 drop into both eyes as needed (dry eyes).     carvedilol (COREG) 3.125 MG tablet Take 1 tablet (3.125 mg total) by mouth 2 (two) times daily with a meal. Hold for SBP <100 mmhg OR hr < 60/min 60 tablet 0   Evolocumab (REPATHA SURECLICK) 270 MG/ML SOAJ Inject 140 mg into the skin every 14 (fourteen) days. 2 mL 12   ezetimibe (ZETIA) 10 MG tablet Take 1 tablet (10 mg total) by mouth daily. 30 tablet 11   gabapentin (NEURONTIN) 100 MG capsule Take 28m twice daily for 1 week Then 100 mg twice daily for  a week Then 100 mg before bed for a week Then stop 50 capsule 0   glucose blood (FREESTYLE TEST STRIPS) test strip Use to check blood sugar daily 100 each 3   glucose monitoring kit (FREESTYLE) monitoring kit USE TO MONITOR BLOOD GLUCOSE AS DIRECTED 1 each 1   isosorbide mononitrate (IMDUR) 30 MG 24 hr tablet Take 1/2 tablet (15 mg total) by mouth daily. 15 tablet 0   Methoxy PEG-Epoetin Beta (MIRCERA IJ) as directed.     midodrine (PROAMATINE) 10 MG tablet Take 0.5-1 tablets (5-10 mg total) by mouth See admin instructions. Take 1 tablet (10 mg) three times a week. Add 5 mg a night after dialysis as needed for low blood pressure.     multivitamin (RENA-VIT) TABS tablet Take 1 tablet by mouth daily. 90 tablet 3   nitroGLYCERIN (NITROSTAT) 0.4 MG SL tablet Place 1 tablet under the tongue every 5 minutes x 3 doses as needed for chest pain. 25 tablet 12   oxymetazoline (AFRIN) 0.05 % nasal spray Place 1 spray into both nostrils 2 (two) times daily as needed for congestion. 30 mL 0   pantoprazole (PROTONIX) 40 MG tablet Take 1 tablet by mouth daily. 90 tablet 3   saccharomyces  boulardii (FLORASTOR) 250 MG capsule Take 1 capsule (250 mg total) by mouth 2 (two) times daily. 30 capsule 0   sevelamer carbonate (RENVELA) 800 MG tablet Take 2 tablets (1,600 mg total) by mouth 3 (three) times daily with meals. (Patient taking differently: Take 1,600 mg by mouth See admin instructions. Take 2 tablets by mouth three times daily with meals and 1 tablet with snacks)     ticagrelor (BRILINTA) 90 MG TABS tablet Take 1 tablet (90 mg total) by mouth 2 (two) times daily. 60 tablet 11   vitamin B-12 (CYANOCOBALAMIN) 1000 MCG tablet Take 1,000 mcg by mouth daily.     No current facility-administered medications for this visit.   Objective  Objective:  BP 112/60   Pulse 86   Temp 98.7 F (37.1 C)   Ht _0  (1.702 m)   Wt 214 lb 3.2 oz (97.2 kg)   SpO2 96%   BMI 33.55 kg/m  Gen: NAD, resting comfortably CV: Regular heart rate stable murmur  lungs: CTAB no crackles, wheeze, rhonchi Ext: Trace to 1+ edema bilaterally Skin: warm, dry Neuro: Walks with walker, somewhat hard of hearing, no tremor noted today   Assessment and Plan:   Transitional care management  Coronary artery disease Unfortunately patient with recurrent restenosis/NSTEMI-hoping he gets more sustained benefit with more aggressive control of cholesterol as well as being on Brilinta at this time.  Chest pain substantially decreased after angioplasty-continue aspirin 81 mg, Brilinta, atorvastatin 80 mg, Repatha every 2 weeks, Zetia for now-encourage close follow-up with cardiology-he plans to keep upcoming appointment  Patient has had some advanced directive discussions with cardiology it sounds like-has most form at home no resuscitation beyond defibrillator. No chest compressions.    He would like to live another 5 to 6 years but I tried to be frank with him that although we do not know the course of his lifetime that his cardiac disease could certainly be life limiting as well as his renal disease and overall  complexity of his medical history  He is working on getting stronger-we will add back in nursing through verbal orders to home health-this visit will serve as face-to-face visit for recent hospitalization  Diabetic peripheral neuropathy ([place]) S:He had seen Dr. Posey Pronto  July 2022 and gabapentin was recommended-other options were limited by dialysis with next potential step being opioids-both family and patient are cautious about this especially with him living alone.  Also had been on Lyrica in the past but had hallucinations  Currently on Gabapentin 359m on med list- started on Saturday a little while back and  within 2-3 days on Tuesday- intense tremors.  He has trialed off of this and tremors resolve-when he has been off did not note worsening pain.   A/P: Reasonable control of neuropathy but tremors are intolerable and have resolved in the past when stopped gabapentin-he would like to trial off medication and hold off on opioid treatment-see prescription but we created a wean for him    Type II diabetes mellitus ([place] Has been off the Lantus for some time after starting hemodialysis.  Last A1c slightly high at 7.5 on record but he reports was as low as 6.1 more recently with dialysis-he is going to [place] [facility] of this-I will set a reminder to check on this in 3 weeks.  For now continue diet control only -he will also have records sent [place] [facility]. GKaty Fitchwhen he has his updated diabetic eye exam later this month  Hyperlipidemia Last LDL elevated at 79-hoping with addition of Repatha as well as Zetia to atorvastatin 40 mg that we can prevent restenosis.  Hopefully improving.  He wants to discuss with cardiology at next visit timing of repeat lipid panel in regards to starting Repatha-I would think minimum 4 to 6 weeks but could take up to several months to see full benefit  Other chronic medical conditions appear stable -Did have hyperkalemia in the hospital which resolved by time of discharge  [facility] Lab Results  Component Value Date   NA 138 05/08/2022   K 4.6 05/08/2022   CO2 30 05/08/2022   GLUCOSE 86 05/08/2022   BUN 25 (H) 05/08/2022   CREATININE 7.03 (H) 05/08/2022   CALCIUM 8.8 (L) 05/08/2022   EGFR 9 (L) 10/02/2021   GFRNONAA 7 (L) 05/08/2022     Recommended follow up: Return in about 4 months (around 09/17/2022) for followup or sooner if needed.Schedule b4 you leave. Future Appointments  Date Time [place] 05/19/2022  2:20 PM MAlmyra Deforest [facility] None  06/02/2022  1:45 PM MGardiner Barefoot DPM [facility]  07/14/2022  7:10 AM [facility] DEVICE REMOTES [facility]  09/17/2022 10:00 AM HMarin Olp MD [facility]  10/13/2022  7:05 AM [facility] DEVICE REMOTES [facility]  01/12/2023  7:05 AM [facility] DEVICE REMOTES [facility]    Lab/Order associations:   ICD-10-CM   1. Coronary artery disease of native artery of native heart with stable angina pectoris ([place]  I25.118     2. Type 2 diabetes mellitus with chronic kidney disease on chronic dialysis, without long-term current use of insulin (HCC)  E11.22    N18.6    Z99.2     3. Hyperlipidemia, unspecified hyperlipidemia type  E78.5     4. Diabetic peripheral neuropathy (HCC)  E11.42     5. Atrial fibrillation, unspecified type ([place]  I48.91     6. Idiopathic hypotension  I95.0     7. Tremor  R25.1       Meds ordered this encounter  Medications   gabapentin (NEURONTIN) 100 MG capsule    Sig: Take 2047mtwice daily for 1 week Then 100 mg twice daily for a week Then  100 mg before bed for a week Then stop    Dispense:  50 capsule    Refill:  0    Return precautions advised.  Garret Reddish, MD

## 2022-05-19 NOTE — Telephone Encounter (Signed)
General 05/19/2022  4:22 PM Rodney Farley General -  Note: Pt is not interested In the cardiac rehab program, per phase I cardiac rehab. Closed referral.

## 2022-05-19 NOTE — Assessment & Plan Note (Signed)
Last LDL elevated at 79-hoping with addition of Repatha as well as Zetia to atorvastatin 40 mg that we can prevent restenosis.  Hopefully improving.  He wants to discuss with cardiology at next visit timing of repeat lipid panel in regards to starting Repatha-I would think minimum 4 to 6 weeks but could take up to several months to see full benefit

## 2022-05-19 NOTE — Progress Notes (Unsigned)
Cardiology Office Note:    Date:  05/20/2022   ID:  Kwadwo, Taras 1932/07/02, MRN [id number]  PCP:  [name], MD   [facility] Cardiologist:  Ernst [name], MD     Referring MD: [name], MD   Chief Complaint  Patient presents with   Hospitalization Follow-up    Recent balloon angioplasty. Seen for Dr. [name]    History of Present Illness:    Rodney Farley is a 88 y.o. male with a hx of CAD, hypertension, hyperlipidemia, DM 2, ischemic cardiomyopathy, chronic systolic heart failure, Bell's palsy, COPD, TIA, history of AICD, ESRD on HD and sleep apnea.  Patient had a remote MI in 40 while in [place].  He underwent PCI of LAD, second diagonal and OM 3 in 2016.  Patient has a history of complete heart block and underwent pacemaker implantation, this was later upgraded to CRT-D in 2017.  Last generator change out was in March 2023.  More recently, patient had NSTEMI on 11/11/2021 and underwent DES to ostial LAD.  EF was initially 25 to 30% in 2016, with subsequent normalization and dropped down to 45 to 50% on most recent echocardiogram on 11/11/2021. He has been noted to have subclinical atrial fibrillation on device interrogation in 2021 and has not been placed on anticoagulation therapy due to low A-fib burden.  He has end-stage renal disease on hemodialysis.  Patient is being followed by both Dr. [name] and Dr. Caryl Comes.   Patient was recently admitted in May for NSTEMI with sharp substernal chest pain radiating to the left upper extremity.  Troponin went up to 4677.  Echocardiogram showed EF 45 to 50% with akinesis of the apical septal segment, apical inferior segment, and apex.  Cardiac catheterization on 11/12/2021 showed a 90% stenosis in ostial to proximal LAD, 90% stenosis in ostial D1, 70% stenosis in the ostial Croitoru proximal RCA and mild disease in the OM 3, D2 and LPAV.  He underwent successful DES to ostial to proximal LAD with  3.0 x 24 mm Synergy stent.  Postprocedure, he was continued on aspirin and Plavix with home Lipitor increased.   He was readmitted in June with NSTEMI after presenting with chest pain.  Serial troponin trended up to 1073.  Repeat heart catheter showed a 90% in-stent restenosis of the distal left main to ostial LAD with thrombus and the stent was underexpanded, patient underwent difficult but successful PCI with PTCA of this lesion.  Carvedilol was stopped due to low blood pressure.   Patient was readmitted on 02/08/2022 with NSTEMI.  Troponin peaked at 498.  Repeat cardiac catheterization revealed restenosis of proximal LAD stent.  Intravascular imaging again showed stent was not fully expanded due to calcification.  Patient underwent intracoronary lithotripsy and aggressive dilatation with 4.0 balloon.  Additional stenting was not done due to inability to expand fully.  Patient was last seen on 02/17/2022 by Dr. [name] along with his daughter at which time he was doing well.   Unfortunately, patient was readmitted to the hospital on 03/30/2022 due to recurrent chest pain.  Troponin was minimally elevated but not consistent with ACS.  Cardiac catheterization performed on 03/31/2022 showed a 70% ostial to proximal RCA lesion, 90% distal left main and ostial LAD in-stent restenosis treated with balloon angioplasty.  Hospital course was complicated by possible seizure-like activity, patient underwent an EEG which showed no seizure or epileptic discharges.  I last saw the patient on 04/21/2022, he was  doing ok. I started him on Repatha.  More recently, patient was admitted in October 2023 with NSTEMI again.  He underwent cardiac catheterization by Dr. Claiborne Billings on 05/07/2022 which showed a 70% ostial to proximal RCA lesion which is a small vessel, 40% mid to distal left circumflex artery, 50% OM 3, 30% D2, 90% distal left main to ostial LAD treated with balloon angioplasty.  Plavix was discontinued in favor of  Brilinta.  Patient presents today accompanied by daughter Rodney Farley for follow-up.  He is tolerating the current medication that has not had any further chest pain.  According to his daughter, he had chest pain for 3 to 4 days before he went to the hospital.  His Plavix was switched to Brilinta.  Unfortunately I do not see he ever had a P2Y12 study therefore I am not sure if he could be one the Plavix nonresponders.  Dr. Claiborne Billings did mention possibility of referring the patient for brachytherapy, however he talk to Dr. Irish Lack and neither one of them knows which [facility] still does brachytherapy now a day.  I will talk to Dr. [name] regarding her case and I called the patient's daughter tomorrow to discuss the final plan.  He will need fasting lipid panel and LFT in 2 weeks.  Past Medical History:  Diagnosis Date   AICD (automatic cardioverter/defibrillator) present    [employer] pacer   Anemia    Anxiety and depression    Arthritis    Bell's palsy    left side. after shingles episode   BPH associated with nocturia    Chronic systolic CHF (congestive heart failure) ([place])    EF normalized by Echo 2019   COPD (chronic obstructive pulmonary disease) ([place])    Severe   Coronary artery disease    a. s/p MI in 1994/1995 while in [place] s/p questionable PCI. 03/2015: progression of disease, for staged PCI.   Diabetic peripheral neuropathy (HCC)    GERD (gastroesophageal reflux disease)    Gout    Hard of hearing    B/L   History of chronic pancreatitis 07/23/2017   noted on CT abd/pelvis   History of shingles    Hypercholesterolemia    Hypertension    Obesity    Sleep apnea    "sleeps w/humidifyer when he panics and gets short of breath" (04/08/2015)   TIA (transient ischemic attack) X 3   Trigger middle finger of left hand    Type II diabetes mellitus ([place])    Wears glasses    Wears hearing aid     Past Surgical History:  Procedure Laterality Date   APPENDECTOMY     AV  FISTULA PLACEMENT Right 02/07/2019   Procedure: ARTERIOVENOUS (AV) FISTULA CREATION RIGHT UPPER ARM;  Surgeon: Waynetta Sandy, MD;  Location: [place];  Service: Vascular;  Laterality: Right;   BIV ICD GENERATOR CHANGEOUT N/A 10/09/2021   Procedure: BIV ICD GENERATOR CHANGEOUT;  Surgeon: Deboraha Sprang, MD;  Location: [place] [facility];  Service: Cardiovascular;  Laterality: N/A;   CARDIAC CATHETERIZATION N/A 03/29/2015   Procedure: Right/Left Heart Cath and Coronary Angiography;  Surgeon: Kenshawn M [name], MD;  Location: [place] [facility];  Service: Cardiovascular;  Laterality: N/A;   [place]   "after my MI; put me on heart RX after cath"   CARDIAC CATHETERIZATION N/A 04/09/2015   Procedure: Coronary Stent Intervention;  Surgeon: Daemian M [name], MD;  Location: [place] [facility];  Service: Cardiovascular;  Laterality:  N/A;   CATARACT EXTRACTION W/ INTRAOCULAR LENS  IMPLANT, BILATERAL     CHOLECYSTECTOMY N/A 10/13/2017   Procedure: LAPAROSCOPIC CHOLECYSTECTOMY WITH LYSIS OF ADHESIONS;  Surgeon: Ileana Roup, MD;  Location: [facility];  Service: General;  Laterality: N/A;   COLONOSCOPY     CORONARY ANGIOGRAPHY N/A 11/12/2021   Procedure: CORONARY ANGIOGRAPHY;  Surgeon: Wellington Hampshire, MD;  Location: [place] [facility];  Service: Cardiovascular;  Laterality: N/A;   CORONARY ANGIOGRAPHY N/A 12/24/2021   Procedure: CORONARY ANGIOGRAPHY;  Surgeon: Troy Sine, MD;  Location: [place] [facility];  Service: Cardiovascular;  Laterality: N/A;   CORONARY ANGIOGRAPHY N/A 03/31/2022   Procedure: CORONARY ANGIOGRAPHY;  Surgeon: Jettie Booze, MD;  Location: [place] [facility];  Service: Cardiovascular;  Laterality: N/A;   CORONARY ANGIOGRAPHY N/A 05/07/2022   Procedure: CORONARY ANGIOGRAPHY;  Surgeon: Jettie Booze, MD;  Location: [place] [facility];  Service: Cardiovascular;  Laterality: N/A;   CORONARY BALLOON ANGIOPLASTY N/A 12/24/2021   Procedure: CORONARY  BALLOON ANGIOPLASTY;  Surgeon: Troy Sine, MD;  Location: [place] [facility];  Service: Cardiovascular;  Laterality: N/A;   CORONARY BALLOON ANGIOPLASTY N/A 02/09/2022   Procedure: CORONARY BALLOON ANGIOPLASTY;  Surgeon: Nelva Bush, MD;  Location: [place] [facility];  Service: Cardiovascular;  Laterality: N/A;  LAD   CORONARY BALLOON ANGIOPLASTY N/A 03/31/2022   Procedure: CORONARY BALLOON ANGIOPLASTY;  Surgeon: Jettie Booze, MD;  Location: [place] [facility];  Service: Cardiovascular;  Laterality: N/A;   CORONARY BALLOON ANGIOPLASTY N/A 05/07/2022   Procedure: CORONARY BALLOON ANGIOPLASTY;  Surgeon: Jettie Booze, MD;  Location: [place] [facility];  Service: Cardiovascular;  Laterality: N/A;   CORONARY STENT INTERVENTION N/A 11/12/2021   Procedure: CORONARY STENT INTERVENTION;  Surgeon: Wellington Hampshire, MD;  Location: [place] [facility];  Service: Cardiovascular;  Laterality: N/A;  LAD   DENTAL SURGERY     EP IMPLANTABLE DEVICE N/A 09/23/2015   MDT CRT-D, Dr. Caryl Comes   HIATAL HERNIA REPAIR  1977   ILEOCECETOMY N/A 03/27/2017   Procedure: ILEOCECECTOMY;  Surgeon: Ileana Roup, MD;  Location: [place];  Service: General;  Laterality: N/A;   INSERT / REPLACE / REMOVE PACEMAKER  07/2008   Complete heart block status post DDD with good function   INTRAVASCULAR IMAGING/OCT N/A 12/24/2021   Procedure: INTRAVASCULAR IMAGING/OCT;  Surgeon: Troy Sine, MD;  Location: [place] [facility];  Service: Cardiovascular;  Laterality: N/A;   INTRAVASCULAR LITHOTRIPSY  02/09/2022   Procedure: INTRAVASCULAR LITHOTRIPSY;  Surgeon: Nelva Bush, MD;  Location: [place] [facility];  Service: Cardiovascular;;  LAD   INTRAVASCULAR ULTRASOUND/IVUS N/A 02/09/2022   Procedure: Intravascular Ultrasound/IVUS;  Surgeon: Nelva Bush, MD;  Location: [place] [facility];  Service: Cardiovascular;  Laterality: N/A;  LAD   IR FLUORO GUIDE CV LINE RIGHT  04/03/2019   IR US GUIDE VASC ACCESS RIGHT   04/03/2019   LAPAROTOMY N/A 03/27/2017   Procedure: EXPLORATORY LAPAROTOMY;  Surgeon: Ileana Roup, MD;  Location: [place];  Service: General;  Laterality: N/A;   RIGHT HEART CATH AND CORONARY ANGIOGRAPHY N/A 02/09/2022   Procedure: RIGHT HEART CATH AND CORONARY ANGIOGRAPHY;  Surgeon: Nelva Bush, MD;  Location: [place] [facility];  Service: Cardiovascular;  Laterality: N/A;   TONSILLECTOMY     UPPER GI ENDOSCOPY      Current Medications: Current Meds  Medication Sig   aspirin EC 81 MG tablet Take 81 mg by mouth daily.  atorvastatin (LIPITOR) 80 MG tablet Take 1 tablet by mouth daily.   calcitRIOL (ROCALTROL) 0.25 MCG capsule Take 7 capsules (1.75 mcg total) by mouth every Monday, Wednesday, and Friday with hemodialysis.   carbamide peroxide (DEBROX) 6.5 % OTIC solution Place 5 drops into both ears 2 (two) times daily as needed (earwax).   Carboxymethylcellul-Glycerin (LUBRICATING EYE DROPS OP) Place 1 drop into both eyes as needed (dry eyes).   carvedilol (COREG) 3.125 MG tablet Take 1 tablet (3.125 mg total) by mouth 2 (two) times daily with a meal. Hold for SBP <100 mmhg OR hr < 60/min   Evolocumab (REPATHA SURECLICK) 315 MG/ML SOAJ Inject 140 mg into the skin every 14 (fourteen) days.   ezetimibe (ZETIA) 10 MG tablet Take 1 tablet (10 mg total) by mouth daily.   gabapentin (NEURONTIN) 100 MG capsule Take 274m twice daily for 1 week Then 100 mg twice daily for a week Then 100 mg before bed for a week Then stop (Patient taking differently: Take 200 mg by mouth daily. Take 2053mtwice daily for 1 week Then 100 mg twice daily for a week Then 100 mg before bed for a week Then stop)   glucose blood (FREESTYLE TEST STRIPS) test strip Use to check blood sugar daily   glucose monitoring kit (FREESTYLE) monitoring kit USE TO MONITOR BLOOD GLUCOSE AS DIRECTED   isosorbide mononitrate (IMDUR) 30 MG 24 hr tablet Take 1/2 tablet (15 mg total) by mouth daily.   Methoxy PEG-Epoetin Beta  (MIRCERA IJ) as directed.   midodrine (PROAMATINE) 10 MG tablet Take 0.5-1 tablets (5-10 mg total) by mouth See admin instructions. Take 1 tablet (10 mg) three times a week. Add 5 mg a night after dialysis as needed for low blood pressure.   multivitamin (RENA-VIT) TABS tablet Take 1 tablet by mouth daily.   nitroGLYCERIN (NITROSTAT) 0.4 MG SL tablet Place 1 tablet under the tongue every 5 minutes x 3 doses as needed for chest pain.   oxymetazoline (AFRIN) 0.05 % nasal spray Place 1 spray into both nostrils 2 (two) times daily as needed for congestion.   pantoprazole (PROTONIX) 40 MG tablet Take 1 tablet by mouth daily.   saccharomyces boulardii (FLORASTOR) 250 MG capsule Take 1 capsule (250 mg total) by mouth 2 (two) times daily.   sevelamer carbonate (RENVELA) 800 MG tablet Take 2 tablets (1,600 mg total) by mouth 3 (three) times daily with meals. (Patient taking differently: Take 1,600 mg by mouth See admin instructions. Take 2 tablets by mouth three times daily with meals and 1 tablet with snacks)   ticagrelor (BRILINTA) 90 MG TABS tablet Take 1 tablet (90 mg total) by mouth 2 (two) times daily.   vitamin B-12 (CYANOCOBALAMIN) 1000 MCG tablet Take 1,000 mcg by mouth daily.     Allergies:   Bee venom, Lyrica [pregabalin], Prednisone, and Zocor [simvastatin]   Social History   Socioeconomic History   Marital status: Legally Separated    Spouse name: Not on file   Number of children: 1   Years of education: college   Highest education level: Not on file  Occupational History   Occupation: Retired  Tobacco Use   Smoking status: Former    Packs/day: 1.50    Years: 54.00    Total pack years: 81.00    Types: Cigarettes    Quit date: 07/18/2007    Years since quitting: 14.8    Passive exposure: Never   Smokeless tobacco: Never  Vaping Use   Vaping  Use: Never used  Substance and Sexual Activity   Alcohol use: Yes    Comment: rare   Drug use: No   Sexual activity: Not Currently   Other Topics Concern   Not on file  Social History Narrative   Married 1994 (together since 1989) 1 son, 1 stepson. 3 grandkids, 6 great grandkids      Retired from [employer]. Had 2 years of collge.       Faith: Mormon      Here on [employer] since 2004 from [place];    Social Determinants of Health   Financial Resource Strain: Not on file  Food Insecurity: No Food Insecurity (05/07/2022)   Hunger Vital Sign    Worried About Running Out of Food in the Last Year: Never true    Ran Out of Food in the Last Year: Never true  Transportation Needs: No Transportation Needs (05/07/2022)   PRAPARE - Hydrologist (Medical): No    Lack of Transportation (Non-Medical): No  Physical Activity: Not on file  Stress: Not on file  Social Connections: Not on file     Family History: [name] family history includes Heart attack in his brother; Leukemia in his father; Stroke in his mother and sister.  ROS:   Please see the history of present illness.     All other systems reviewed and are negative.  EKGs/Labs/Other Studies Reviewed:    The following studies were reviewed today:  Cath 05/07/2022   Ost RCA to Prox RCA lesion is 70% stenosed.  Small vessel.   Prox Cx lesion is 30% stenosed.   Mid Cx to Dist Cx lesion is 40% stenosed.   Mid Cx lesion is 30% stenosed.   2nd Diag-1 lesion is 30% stenosed.   3rd Mrg lesion is 50% stenosed.   Non-stenotic 2nd Diag-2 lesion was previously treated.  Patent stent.   Dist LM to Ost LAD lesion is 90% stenosed.   Scoring balloon angioplasty was performed using a BALLN SCOREFLEX 3.50X10.   Post intervention, there is a 20% residual stenosis.   Another episode of restenosis in the ostial to proximal LAD.  Successful balloon angioplasty with score flex balloon at high pressure followed by Baxter balloon at high pressure.   Unfortunately, there are no other options at our facility.  We will need to look into  availability of drug-coated balloon or brachytherapy as possible treatments if this recurs.  EKG:  EKG is ordered today.  The ekg ordered today demonstrates AV dual paced heart rhythm.  Recent Labs: 11/11/2021: TSH 1.400 03/30/2022: Magnesium 2.0 05/06/2022: ALT 22 05/08/2022: BUN 25; Creatinine, Ser 7.03; Hemoglobin 9.9; Platelets 141; Potassium 4.6; Sodium 138  Recent Lipid Panel    Component Value Date/Time   CHOL 153 01/06/2022 1228   TRIG 197 (H) 01/06/2022 1228   HDL 41 01/06/2022 1228   CHOLHDL 3.7 01/06/2022 1228   CHOLHDL 4.2 11/12/2021 0554   VLDL 47 (H) 11/12/2021 0554   LDLCALC 79 01/06/2022 1228   LDLDIRECT 60.0 04/13/2018 0926     Risk Assessment/Calculations:           Physical Exam:    VS:  BP (!) 124/56 (BP Location: Left Arm, Patient Position: Sitting, Cuff Size: Normal)   Pulse 81   Ht _0  (1.702 m)   Wt 213 lb 6.4 oz (96.8 kg)   SpO2 95%   BMI 33.42 kg/m         Wt  Readings from Last 3 Encounters:  05/19/22 213 lb 6.4 oz (96.8 kg)  05/19/22 214 lb 3.2 oz (97.2 kg)  05/07/22 205 lb 14.4 oz (93.4 kg)     GEN:  Well nourished, well developed in no acute distress HEENT: Normal NECK: No JVD; No carotid bruits LYMPHATICS: No lymphadenopathy CARDIAC: RRR, no murmurs, rubs, gallops RESPIRATORY:  Clear to auscultation without rales, wheezing or rhonchi  ABDOMEN: Soft, non-tender, non-distended MUSCULOSKELETAL:  No edema; No deformity  SKIN: Warm and dry NEUROLOGIC:  Alert and oriented x 3 PSYCHIATRIC:  Normal affect   ASSESSMENT:    1. Coronary artery disease involving native coronary artery of native heart without angina pectoris   2. Hyperlipidemia LDL goal <70   3. Primary hypertension   4. Controlled type 2 diabetes mellitus without complication, without long-term current use of insulin ([place])   5. ESRD on dialysis ([place])   6. Ischemic cardiomyopathy    PLAN:    In order of problems listed above:  CAD: Unfortunately patient has  underwent PCI 5 different times since May.  He had recurrent restenosis of the ostial LAD that required balloon angioplasty.  His Plavix has been switched to Brilinta.  Unfortunately no P2Y12 study was obtained recently to see if he could be one of the Plavix nonresponder.  Dr. Claiborne Billings brought up the possibility of brachytherapy, however he does not know which [facility] that still does brachytherapy at this time.  I will discuss the case with Dr. [name] to see what he would recommend.  He has not had any chest pain since he left the hospital.  Compliant with aspirin and Brilinta.   Addendum: I have discussed with Dr. [name] and called the patient's daughter.  According to Dr. [name], he doubt P2Y12 study would not have made any difference as the problem is due to underexpansion of the previously placed stent secondary to calcified vessel.  Dr. [name] does not think brachytherapy will offer him much benefit.  I discussed with daughter and Dr. [name], Dr. [name] did not think referral to a tertiary academic center will offer the patient a solution to his current issue, the daughter also did not think the father can travel to a tertiary center either.  The 2 remaining options in this case will be: 1.  Consider drug-coated balloon angioplasty when the patient comes to the hospital next time.  Although drug-coated balloon angioplasty is typically used in PV procedure, usage of such drug-coated balloon angioplasty will be off label and not approved by FDA. 2.  Second option is to proceed to palliative care as we have largely exhausted all options if drug-coated balloon angioplasty is not an option.  The daughter was appreciative of the phone call.  Hyperlipidemia: On Lipitor.  Repatha was added during the last office visit to reduce LDL down to less than 55.  Pending fasting lipid panel in 2 weeks  Hypertension: Blood pressure stable  DM2: Managed by primary care provider  End-stage renal disease on  hemodialysis: Followed by nephrology service  Ischemic cardiomyopathy on Imdur and low-dose carvedilol.           Medication Adjustments/Labs and Tests Ordered: Current medicines are reviewed at length with the patient today.  Concerns regarding medicines are outlined above.  Orders Placed This Encounter  Procedures   Lipid panel   Hepatic function panel   EKG 12-Lead   No orders of the defined types were placed in this encounter.   Patient Instructions  Medication Instructions:  Your physician recommends that you continue on your current medications as directed. Please refer to the Current Medication list given to you today.  *If you need a refill on your cardiac medications before your next appointment, please call your pharmacy*  Lab Work: Your physician recommends that you return for lab work in: 2 weeks Fasting Lipid Panel-DO NOT eat or drink past midnight. Okay to have water Hepatic (Liver) Function Test   If you have labs (blood work) drawn today and your tests are completely normal, you will receive your results only by: MyChart Message (if you have MyChart) OR A paper copy in the mail If you have any lab test that is abnormal or we need to change your treatment, we will call you to review the results.  Testing/Procedures: NONE ordered at this time of appointment   Follow-Up: At [facility], you and your health needs are our priority.  As part of our continuing mission to provide you with exceptional heart care, we have created designated Provider Care Teams.  These Care Teams include your primary Cardiologist (physician) and Advanced Practice Providers (APPs -  Physician Assistants and Nurse Practitioners) who all work together to provide you with the care you need, when you need it.  Your next appointment:   1 month(s)  The format for your next appointment:   In Person  Provider:   APP on a day Dr. [name] is in office      Other  Instructions  Important Information About Sugar         Hilbert Corrigan, [place]  05/20/2022 12:08 PM    [place]

## 2022-05-19 NOTE — Patient Instructions (Signed)
Medication Instructions:  Your physician recommends that you continue on your current medications as directed. Please refer to the Current Medication list given to you today.  *If you need a refill on your cardiac medications before your next appointment, please call your pharmacy*  Lab Work: Your physician recommends that you return for lab work in: 2 weeks Fasting Lipid Panel-DO NOT eat or drink past midnight. Okay to have water Hepatic (Liver) Function Test   If you have labs (blood work) drawn today and your tests are completely normal, you will receive your results only by: MyChart Message (if you have MyChart) OR A paper copy in the mail If you have any lab test that is abnormal or we need to change your treatment, we will call you to review the results.  Testing/Procedures: NONE ordered at this time of appointment   Follow-Up: At [facility], you and your health needs are our priority.  As part of our continuing mission to provide you with exceptional heart care, we have created designated Provider Care Teams.  These Care Teams include your primary Cardiologist (physician) and Advanced Practice Providers (APPs -  Physician Assistants and Nurse Practitioners) who all work together to provide you with the care you need, when you need it.  Your next appointment:   1 month(s)  The format for your next appointment:   In Person  Provider:   APP on a day Dr. [name] is in office      Other Instructions  Important Information About Sugar

## 2022-05-19 NOTE — Assessment & Plan Note (Addendum)
Unfortunately patient with recurrent restenosis/NSTEMI-hoping he gets more sustained benefit with more aggressive control of cholesterol as well as being on Brilinta at this time.  Chest pain substantially decreased after angioplasty-continue aspirin 81 mg, Brilinta, atorvastatin 80 mg, Repatha every 2 weeks, Zetia for now-encourage close follow-up with cardiology-he plans to keep upcoming appointment  Patient has had some advanced directive discussions with cardiology it sounds like-has most form at home no resuscitation beyond defibrillator. No chest compressions.    He would like to live another 5 to 6 years but I tried to be frank with him that although we do not know the course of his lifetime that his cardiac disease could certainly be life limiting as well as his renal disease and overall complexity of his medical history  He is working on getting stronger-we will add back in nursing through verbal orders to home health-this visit will serve as face-to-face visit for recent hospitalization

## 2022-05-19 NOTE — Patient Instructions (Addendum)
Have copy of diabetic eye exam faxed to [place] at [phone].  Let us know if you get another COVID vaccine.  See if you can have dialysis send me a copy of the last a1c, also lets see what cardiology says on timing of lipid panel- suspect they could collect with dialysis  We will add home health nursing back in   Send [place] copy of updated covid shot  You are eligible to schedule your annual wellness visit with our nurse specialist Otila Kluver.  Please consider scheduling this before you leave today  Wean gabapentin per instructions- let me know if symptoms worsen. Limited options would be looking at narcotics but anxious about this with living alone  Recommended follow up: Return in about 4 months (around 09/17/2022) for followup or sooner if needed.Schedule b4 you leave.  -on way out cancel visit for next week

## 2022-05-20 ENCOUNTER — Encounter: Payer: Self-pay | Admitting: Physician Assistant

## 2022-05-21 NOTE — Progress Notes (Signed)
He tolerated 30 second inflations quite well.  We also did a lot of inflations with 3.5 and 4.0 balloons, so I think he would probably do ok. 40 mm length balloon may make that more difficult.

## 2022-05-24 ENCOUNTER — Other Ambulatory Visit (HOSPITAL_COMMUNITY): Payer: Self-pay

## 2022-05-25 LAB — CBC AND DIFFERENTIAL: Hemoglobin: 10.4 — AB (ref 13.5–17.5)

## 2022-05-25 LAB — BASIC METABOLIC PANEL: Potassium: 5.7 mEq/L — AB (ref 3.5–5.1)

## 2022-05-25 LAB — COMPREHENSIVE METABOLIC PANEL: Albumin: 3.9 (ref 3.5–5.0)

## 2022-05-26 ENCOUNTER — Ambulatory Visit: Payer: PPO | Admitting: Family Medicine

## 2022-05-27 ENCOUNTER — Other Ambulatory Visit (HOSPITAL_COMMUNITY): Payer: Self-pay

## 2022-05-28 ENCOUNTER — Emergency Department (HOSPITAL_COMMUNITY): Payer: PPO

## 2022-05-28 ENCOUNTER — Encounter (HOSPITAL_COMMUNITY): Payer: Self-pay

## 2022-05-28 ENCOUNTER — Other Ambulatory Visit: Payer: Self-pay

## 2022-05-28 ENCOUNTER — Inpatient Hospital Stay (HOSPITAL_COMMUNITY)
Admission: EM | Admit: 2022-05-28 | Discharge: 2022-06-09 | DRG: 492 | Disposition: A | Discharge status: Skilled Nursing Facility | Payer: PPO | Attending: Internal Medicine | Admitting: Internal Medicine

## 2022-05-28 DIAGNOSIS — S93432A Sprain of tibiofibular ligament of left ankle, initial encounter: Secondary | ICD-10-CM | POA: Diagnosis present

## 2022-05-28 DIAGNOSIS — N186 End stage renal disease: Secondary | ICD-10-CM | POA: Diagnosis not present

## 2022-05-28 DIAGNOSIS — Z79899 Other long term (current) drug therapy: Secondary | ICD-10-CM

## 2022-05-28 DIAGNOSIS — E119 Type 2 diabetes mellitus without complications: Secondary | ICD-10-CM

## 2022-05-28 DIAGNOSIS — I10 Essential (primary) hypertension: Secondary | ICD-10-CM | POA: Diagnosis present

## 2022-05-28 DIAGNOSIS — G4733 Obstructive sleep apnea (adult) (pediatric): Secondary | ICD-10-CM | POA: Diagnosis present

## 2022-05-28 DIAGNOSIS — Z91030 Bee allergy status: Secondary | ICD-10-CM

## 2022-05-28 DIAGNOSIS — Z950 Presence of cardiac pacemaker: Secondary | ICD-10-CM | POA: Diagnosis present

## 2022-05-28 DIAGNOSIS — Z95810 Presence of automatic (implantable) cardiac defibrillator: Secondary | ICD-10-CM

## 2022-05-28 DIAGNOSIS — G4730 Sleep apnea, unspecified: Secondary | ICD-10-CM | POA: Diagnosis present

## 2022-05-28 DIAGNOSIS — Z66 Do not resuscitate: Secondary | ICD-10-CM | POA: Diagnosis present

## 2022-05-28 DIAGNOSIS — W010XXA Fall on same level from slipping, tripping and stumbling without subsequent striking against object, initial encounter: Secondary | ICD-10-CM | POA: Diagnosis present

## 2022-05-28 DIAGNOSIS — Z87891 Personal history of nicotine dependence: Secondary | ICD-10-CM

## 2022-05-28 DIAGNOSIS — I5022 Chronic systolic (congestive) heart failure: Secondary | ICD-10-CM | POA: Diagnosis present

## 2022-05-28 DIAGNOSIS — M80072A Age-related osteoporosis with current pathological fracture, left ankle and foot, initial encounter for fracture: Principal | ICD-10-CM | POA: Diagnosis present

## 2022-05-28 DIAGNOSIS — M898X9 Other specified disorders of bone, unspecified site: Secondary | ICD-10-CM | POA: Diagnosis present

## 2022-05-28 DIAGNOSIS — S82892A Other fracture of left lower leg, initial encounter for closed fracture: Secondary | ICD-10-CM | POA: Diagnosis present

## 2022-05-28 DIAGNOSIS — Z955 Presence of coronary angioplasty implant and graft: Secondary | ICD-10-CM

## 2022-05-28 DIAGNOSIS — I252 Old myocardial infarction: Secondary | ICD-10-CM

## 2022-05-28 DIAGNOSIS — I442 Atrioventricular block, complete: Secondary | ICD-10-CM | POA: Diagnosis present

## 2022-05-28 DIAGNOSIS — E1122 Type 2 diabetes mellitus with diabetic chronic kidney disease: Secondary | ICD-10-CM | POA: Diagnosis present

## 2022-05-28 DIAGNOSIS — Z8673 Personal history of transient ischemic attack (TIA), and cerebral infarction without residual deficits: Secondary | ICD-10-CM

## 2022-05-28 DIAGNOSIS — I480 Paroxysmal atrial fibrillation: Secondary | ICD-10-CM | POA: Diagnosis present

## 2022-05-28 DIAGNOSIS — I25708 Atherosclerosis of coronary artery bypass graft(s), unspecified, with other forms of angina pectoris: Secondary | ICD-10-CM | POA: Diagnosis present

## 2022-05-28 DIAGNOSIS — D696 Thrombocytopenia, unspecified: Secondary | ICD-10-CM | POA: Diagnosis present

## 2022-05-28 DIAGNOSIS — Z794 Long term (current) use of insulin: Secondary | ICD-10-CM

## 2022-05-28 DIAGNOSIS — Z6833 Body mass index (BMI) 33.0-33.9, adult: Secondary | ICD-10-CM

## 2022-05-28 DIAGNOSIS — Z634 Disappearance and death of family member: Secondary | ICD-10-CM

## 2022-05-28 DIAGNOSIS — E875 Hyperkalemia: Secondary | ICD-10-CM | POA: Diagnosis present

## 2022-05-28 DIAGNOSIS — Y92009 Unspecified place in unspecified non-institutional (private) residence as the place of occurrence of the external cause: Secondary | ICD-10-CM

## 2022-05-28 DIAGNOSIS — K219 Gastro-esophageal reflux disease without esophagitis: Secondary | ICD-10-CM | POA: Diagnosis present

## 2022-05-28 DIAGNOSIS — W19XXXA Unspecified fall, initial encounter: Principal | ICD-10-CM

## 2022-05-28 DIAGNOSIS — I2510 Atherosclerotic heart disease of native coronary artery without angina pectoris: Secondary | ICD-10-CM | POA: Diagnosis present

## 2022-05-28 DIAGNOSIS — Z992 Dependence on renal dialysis: Secondary | ICD-10-CM

## 2022-05-28 DIAGNOSIS — I132 Hypertensive heart and chronic kidney disease with heart failure and with stage 5 chronic kidney disease, or end stage renal disease: Secondary | ICD-10-CM | POA: Diagnosis present

## 2022-05-28 DIAGNOSIS — Z7902 Long term (current) use of antithrombotics/antiplatelets: Secondary | ICD-10-CM

## 2022-05-28 DIAGNOSIS — E7800 Pure hypercholesterolemia, unspecified: Secondary | ICD-10-CM | POA: Diagnosis present

## 2022-05-28 DIAGNOSIS — M109 Gout, unspecified: Secondary | ICD-10-CM | POA: Diagnosis present

## 2022-05-28 DIAGNOSIS — J449 Chronic obstructive pulmonary disease, unspecified: Secondary | ICD-10-CM | POA: Diagnosis present

## 2022-05-28 DIAGNOSIS — Z888 Allergy status to other drugs, medicaments and biological substances status: Secondary | ICD-10-CM

## 2022-05-28 DIAGNOSIS — D631 Anemia in chronic kidney disease: Secondary | ICD-10-CM | POA: Diagnosis present

## 2022-05-28 DIAGNOSIS — Z8249 Family history of ischemic heart disease and other diseases of the circulatory system: Secondary | ICD-10-CM

## 2022-05-28 DIAGNOSIS — Z7982 Long term (current) use of aspirin: Secondary | ICD-10-CM

## 2022-05-28 DIAGNOSIS — I959 Hypotension, unspecified: Secondary | ICD-10-CM | POA: Diagnosis present

## 2022-05-28 DIAGNOSIS — I9589 Other hypotension: Secondary | ICD-10-CM | POA: Diagnosis present

## 2022-05-28 DIAGNOSIS — E1142 Type 2 diabetes mellitus with diabetic polyneuropathy: Secondary | ICD-10-CM | POA: Diagnosis present

## 2022-05-28 DIAGNOSIS — H9193 Unspecified hearing loss, bilateral: Secondary | ICD-10-CM | POA: Diagnosis present

## 2022-05-28 DIAGNOSIS — Z823 Family history of stroke: Secondary | ICD-10-CM

## 2022-05-28 DIAGNOSIS — E785 Hyperlipidemia, unspecified: Secondary | ICD-10-CM | POA: Diagnosis present

## 2022-05-28 DIAGNOSIS — N401 Enlarged prostate with lower urinary tract symptoms: Secondary | ICD-10-CM | POA: Diagnosis present

## 2022-05-28 DIAGNOSIS — I5042 Chronic combined systolic (congestive) and diastolic (congestive) heart failure: Secondary | ICD-10-CM | POA: Diagnosis present

## 2022-05-28 DIAGNOSIS — I080 Rheumatic disorders of both mitral and aortic valves: Secondary | ICD-10-CM | POA: Diagnosis present

## 2022-05-28 DIAGNOSIS — N2581 Secondary hyperparathyroidism of renal origin: Secondary | ICD-10-CM | POA: Diagnosis present

## 2022-05-28 DIAGNOSIS — E669 Obesity, unspecified: Secondary | ICD-10-CM | POA: Diagnosis present

## 2022-05-28 DIAGNOSIS — R351 Nocturia: Secondary | ICD-10-CM | POA: Diagnosis present

## 2022-05-28 DIAGNOSIS — I255 Ischemic cardiomyopathy: Secondary | ICD-10-CM | POA: Diagnosis present

## 2022-05-28 NOTE — ED Triage Notes (Signed)
Patient presents to the ED via [facility].  [facility] reports the patient slid off of his chair. Reports he went to stand up and slid on papers. Reports his feet slipped out of from under him and twisted his ankle. Denied hitting his head and denied loss of consciousness.   Obvious deformity to left ankle. Denies any other injuries. Left ankle is splinted.   Dialysis fistula on right upper extremity. Dialysis M/W/F  EMS vitals 174/76 HR 82 RR 20 SpO2 96% RA CBG 122

## 2022-05-28 NOTE — ED Provider Notes (Signed)
[facility] EMERGENCY DEPARTMENT Provider Note   CSN: [id number] Arrival date & time: 05/28/22  2332     History  Chief Complaint  Patient presents with   Leg Injury    Rodney Farley is a 88 y.o. male.  HPI     This is an 88-year-old male with a history of diabetes, end-stage renal disease on dialysis Monday, Wednesday, Friday who presents with left leg injury.  Patient reports that he was getting up out of a chair when he stepped on some papers on the floor.  He slipped.  Initially he did not note any significant pain but he has significant neuropathy in his bilateral lower extremities.  He looked down and noted that his left ankle was deformed.  He states he does not have any significant pain unless he moves it.  Patient denies hitting his head or loss of consciousness.  He last dialyzed on Wednesday.  Home Medications Prior to Admission medications   Medication Sig Start Date End Date Taking? Authorizing Provider  aspirin EC 81 MG tablet Take 81 mg by mouth daily.    [provider]  atorvastatin (LIPITOR) 80 MG tablet Take 1 tablet by mouth daily. 11/14/21   Johnson, Kathleen R, PA-C  calcitRIOL (ROCALTROL) 0.25 MCG capsule Take 7 capsules (1.75 mcg total) by mouth every Monday, Wednesday, and Friday with hemodialysis. 01/26/22   Jordan, Zyaire M, MD  carbamide peroxide (DEBROX) 6.5 % OTIC solution Place 5 drops into both ears 2 (two) times daily as needed (earwax). 11/13/21   Crenshaw, Brian S, MD  Carboxymethylcellul-Glycerin (LUBRICATING EYE DROPS OP) Place 1 drop into both eyes as needed (dry eyes).    [provider]  carvedilol (COREG) 3.125 MG tablet Take 1 tablet (3.125 mg total) by mouth 2 (two) times daily with a meal. Hold for SBP <100 mmhg OR hr < 60/min 05/08/22 06/07/22  Kc, Ramesh, MD  Evolocumab (REPATHA SURECLICK) 140 MG/ML SOAJ Inject 140 mg into the skin every 14 (fourteen) days. 04/22/22   Jordan, Cevin M, MD  ezetimibe  (ZETIA) 10 MG tablet Take 1 tablet (10 mg total) by mouth daily. 05/08/22   Kc, Ramesh, MD  gabapentin (NEURONTIN) 100 MG capsule Take 200mg twice daily for 1 week Then 100 mg twice daily for a week Then 100 mg before bed for a week Then stop Patient taking differently: Take 200 mg by mouth daily. Take 200mg twice daily for 1 week Then 100 mg twice daily for a week Then 100 mg before bed for a week Then stop 05/19/22   Hunter, Stephen O, MD  glucose blood (FREESTYLE TEST STRIPS) test strip Use to check blood sugar daily 05/01/19   Hunter, Stephen O, MD  glucose monitoring kit (FREESTYLE) monitoring kit USE TO MONITOR BLOOD GLUCOSE AS DIRECTED 05/01/19   Hunter, Stephen O, MD  isosorbide mononitrate (IMDUR) 30 MG 24 hr tablet Take 1/2 tablet (15 mg total) by mouth daily. 05/09/22 06/08/22  Kc, Ramesh, MD  Methoxy PEG-Epoetin Beta (MIRCERA IJ) as directed. 02/26/21 07/15/22  [provider]  midodrine (PROAMATINE) 10 MG tablet Take 0.5-1 tablets (5-10 mg total) by mouth See admin instructions. Take 1 tablet (10 mg) three times a week. Add 5 mg a night after dialysis as needed for low blood pressure. 04/02/22   Alekh, Kshitiz, MD  multivitamin (RENA-VIT) TABS tablet Take 1 tablet by mouth daily. 08/22/21   Hunter, Stephen O, MD  nitroGLYCERIN (NITROSTAT) 0.4 MG SL tablet Place 1   tablet under the tongue every 5 minutes x 3 doses as needed for chest pain. 11/13/21   Johnson, Kathleen R, PA-C  oxymetazoline (AFRIN) 0.05 % nasal spray Place 1 spray into both nostrils 2 (two) times daily as needed for congestion. 12/29/21   Hunter, Stephen O, MD  pantoprazole (PROTONIX) 40 MG tablet Take 1 tablet by mouth daily. 11/14/21   Johnson, Kathleen R, PA-C  saccharomyces boulardii (FLORASTOR) 250 MG capsule Take 1 capsule (250 mg total) by mouth 2 (two) times daily. 04/08/19   Regalado, Belkys A, MD  sevelamer carbonate (RENVELA) 800 MG tablet Take 2 tablets (1,600 mg total) by mouth 3 (three) times daily with  meals. Patient taking differently: Take 1,600 mg by mouth See admin instructions. Take 2 tablets by mouth three times daily with meals and 1 tablet with snacks 11/13/21   Crenshaw, Brian S, MD  ticagrelor (BRILINTA) 90 MG TABS tablet Take 1 tablet (90 mg total) by mouth 2 (two) times daily. 05/08/22   Kc, Ramesh, MD  vitamin B-12 (CYANOCOBALAMIN) 1000 MCG tablet Take 1,000 mcg by mouth daily.    [provider]      Allergies    Bee venom, Lyrica [pregabalin], Prednisone, and Zocor [simvastatin]    Review of Systems   Review of Systems  Constitutional:  Negative for fever.  Respiratory:  Negative for shortness of breath.   Cardiovascular:  Negative for chest pain.  Musculoskeletal:        Left ankle deformity  All other systems reviewed and are negative.   Physical Exam Updated Vital Signs BP (!) 103/44   Pulse 73   Temp 98.2 F (36.8 C) (Temporal)   Resp 18   SpO2 96%  Physical Exam Vitals and nursing note reviewed.  Constitutional:      Appearance: He is well-developed.     Comments: Elderly, nontoxic-appearing, no acute distress  HENT:     Head: Normocephalic and atraumatic.  Eyes:     Pupils: Pupils are equal, round, and reactive to light.  Cardiovascular:     Rate and Rhythm: Normal rate and regular rhythm.     Heart sounds: Normal heart sounds. No murmur heard.    Comments: Fistula right upper extremity, dried blood noted, no active bleeding, positive thrill Pulmonary:     Effort: Pulmonary effort is normal. No respiratory distress.     Breath sounds: Normal breath sounds. No wheezing.  Abdominal:     Palpations: Abdomen is soft.     Tenderness: There is no abdominal tenderness.     Comments: Abdominal scarring noted  Musculoskeletal:     Cervical back: Neck supple.     Comments: Obvious deformity of the left ankle, there is slight skin tenting over the medial malleolus with an overlying abrasion, no puncture wounds, no exposure of bone, 2+ DP pulse,  diminished sensation but otherwise neurovascularly intact  Lymphadenopathy:     Cervical: No cervical adenopathy.  Skin:    General: Skin is warm and dry.  Neurological:     Mental Status: He is alert and oriented to person, place, and time.  Psychiatric:        Mood and Affect: Mood normal.     ED Results / Procedures / Treatments   Labs (all labs ordered are listed, but only abnormal results are displayed) Labs Reviewed  CBC WITH DIFFERENTIAL/PLATELET - Abnormal; Notable for the following components:      Result Value   RBC 3.36 (*)    Hemoglobin 11.7 (*)      HCT 35.7 (*)    MCV 106.3 (*)    MCH 34.8 (*)    Platelets 120 (*)    All other components within normal limits  BASIC METABOLIC PANEL - Abnormal; Notable for the following components:   Chloride 96 (*)    Glucose, Bld 124 (*)    Creatinine, Ser 6.90 (*)    GFR, Estimated 7 (*)    Anion gap 16 (*)    All other components within normal limits    EKG None  Radiology DG Ankle Complete Left  Result Date: 05/29/2022 CLINICAL DATA:  Left ankle fracture subluxation, post reduction imaging EXAM: LEFT ANKLE COMPLETE - 3+ VIEW COMPARISON:  05/28/2022 FINDINGS: Interval closed reduction of left ankle fracture subluxation. Distal left fibular diaphyseal fracture demonstrates 1/2 shaft with lateral displacement and mild posterior angulation. Medial joint spacel widening is improved with residual 1 cm lateral subluxation of the talar dome in relation to the tibial plafond. External immobilizer has been placed. Bimalleolar soft tissue swelling again noted. IMPRESSION: 1. Interval closed reduction of left ankle fracture subluxation. Residual lateral subluxation of the talar dome in relation to the tibial plafond. Electronically Signed   By: Ashesh  Parikh M.D.   On: 05/29/2022 01:55   DG Ankle Complete Left  Result Date: 05/29/2022 CLINICAL DATA:  Fall, left ankle pain EXAM: LEFT ANKLE COMPLETE - 3+ VIEW COMPARISON:  None  Available. FINDINGS: Acute fracture subluxation of the left ankle noted. There is an oblique fracture of the distal left fibular diaphysis 3 cm proximal to the tibial plafond and with 1 shaft with lateral displacement and moderate posterior angulation of the distal fracture fragment. There is 2 cm lateral and 1.5 cm posterior subluxation of the talar dome in relation to the tibial plafond with marked widening of the medial joint space. Multiple ossific densities seen within the medial joint space appear corticated likely reflect the sequela of remote trauma or inflammation. There is moderate bimalleolar soft tissue swelling. Vascular calcifications are noted. IMPRESSION: 1. Acute fracture subluxation of the left ankle as described above. 2. Moderate bimalleolar soft tissue swelling. Electronically Signed   By: Ashesh  Parikh M.D.   On: 05/29/2022 00:08    Procedures Reduction of fracture  Date/Time: 05/29/2022 5:09 AM  Performed by: Horton, [name] F, MD Authorized by: Horton, [name] F, MD  Consent: Verbal consent obtained. Consent given by: patient Patient identity confirmed: verbally with patient Time out: Immediately prior to procedure a "time out" was called to verify the correct patient, procedure, equipment, support staff and site/side marked as required. Local anesthesia used: no  Anesthesia: Local anesthesia used: no  Sedation: Patient sedated: no  Patient tolerance: patient tolerated the procedure well with no immediate complications Comments: Closed reduction of left lateral malleolus fracture and subluxation of the ankle       Medications Ordered in ED Medications  ketamine 50 mg in normal saline 5 mL (10 mg/mL) syringe (20 mg Intravenous Given 05/29/22 0104)  oxyCODONE-acetaminophen (PERCOCET/ROXICET) 5-325 MG per tablet 1 tablet (1 tablet Oral Given 05/29/22 0258)    ED Course/ Medical Decision Making/ A&P Clinical Course as of 05/29/22 0509  Fri May 29, 2022  0116  Patient administered third analgesic doses of ketamine for reduction of the left ankle subluxation and fracture.   [CH]  0133 Spoke briefly with Dr. Looney, orthopedics.  Recommends elevation of the extremity and follow-up in approximately 1 week.  Discussed with him the small abrasion over the medial aspect of the ankle.    Low suspicion for open fracture given that it is contralateral to the fracture.  Likely related to skin tenting prior to reduction. [CH]  0508 Unable to ambulate safely with crutches.  Per his daughter-in-law, they do have a wheelchair but he lives alone and would not be able to safely navigate home alone.  Additionally he is unable to go to his daughter-in-law's house because of steps.  Will consult with TOC and physical therapy.  Patient is motivated to go home. [CH]    Clinical Course User Index [CH] Horton, [name] F, MD                           Medical Decision Making Amount and/or Complexity of Data Reviewed Labs: ordered. Radiology: ordered.  Risk Prescription drug management.   This patient presents to the ED for concern of left ankle injury, this involves an extensive number of treatment options, and is a complaint that carries with it a high risk of complications and morbidity.  I considered the following differential and admission for this acute, potentially life threatening condition.  The differential diagnosis includes fracture, dislocation, sprain  MDM:    This is an 88-year-old male who presents with an injury to the left ankle.  He is nontoxic.  Vital signs are reassuring.  He is able to provide adequate history.  Reports mechanical fall.  He has obvious deformity of the left ankle.  There is slight tenting over the medial malleolus with an abrasion.  X-rays show a distal fibula fracture and subluxation of the ankle.  This was reduced with analgesia at the bedside.  Repeat x-rays reviewed and show better alignment with only slightly persistent lateral  subluxation.  Unfortunately, the patient is unable to safely ambulate with crutches.  He lives alone.  He states that his son has been sick and his daughter-in-law is his primary caregiver.  He does have a wheelchair.  Discussions with daughter-in-law indicate that she does not feel he will be able to navigate home safely and because of stairs at his daughter-in-law's house, he would be unable to stay with them.  We will consult with transition of care team and physical therapy regarding a safe discharge plan for this patient.  (Labs, imaging, consults)  Labs: I Ordered, and personally interpreted labs.  The pertinent results include: CBC, BMP  Imaging Studies ordered: I ordered imaging studies including ankle films, postreduction films I independently visualized and interpreted imaging. I agree with the radiologist interpretation  Additional history obtained from chart review, daughter-in-law.  External records from outside source obtained and reviewed including prior evaluations  Cardiac Monitoring: The patient was maintained on a cardiac monitor.  I personally viewed and interpreted the cardiac monitored which showed an underlying rhythm of: Sinus rhythm  Reevaluation: After the interventions noted above, I reevaluated the patient and found that they have :improved  Social Determinants of Health:  lives independently  Disposition: Pending  Co morbidities that complicate the patient evaluation  Past Medical History:  Diagnosis Date   AICD (automatic cardioverter/defibrillator) present    [employer] pacer   Anemia    Anxiety and depression    Arthritis    Bell's palsy    left side. after shingles episode   BPH associated with nocturia    Chronic systolic CHF (congestive heart failure) (HCC)    EF normalized by Echo 2019   COPD (chronic obstructive pulmonary disease) (HCC)    Severe   Coronary artery   disease    a. s/p MI in 1994/1995 while in [place] s/p questionable PCI.  03/2015: progression of disease, for staged PCI.   Diabetic peripheral neuropathy (HCC)    GERD (gastroesophageal reflux disease)    Gout    Hard of hearing    B/L   History of chronic pancreatitis 07/23/2017   noted on CT abd/pelvis   History of shingles    Hypercholesterolemia    Hypertension    Obesity    Sleep apnea    "sleeps w/humidifyer when he panics and gets short of breath" (04/08/2015)   TIA (transient ischemic attack) X 3   Trigger middle finger of left hand    Type II diabetes mellitus (HCC)    Wears glasses    Wears hearing aid      Medicines Meds ordered this encounter  Medications   ketamine 50 mg in normal saline 5 mL (10 mg/mL) syringe   oxyCODONE-acetaminophen (PERCOCET/ROXICET) 5-325 MG per tablet 1 tablet    I have reviewed the patients home medicines and have made adjustments as needed  Problem List / ED Course: Problem List Items Addressed This Visit   None               Final Clinical Impression(s) / ED Diagnoses Final diagnoses:  None    Rx / DC Orders ED Discharge Orders     None         Horton, [name] F, MD 05/29/22 0512  

## 2022-05-29 ENCOUNTER — Emergency Department (HOSPITAL_COMMUNITY): Payer: PPO

## 2022-05-29 DIAGNOSIS — Y92009 Unspecified place in unspecified non-institutional (private) residence as the place of occurrence of the external cause: Secondary | ICD-10-CM | POA: Diagnosis not present

## 2022-05-29 DIAGNOSIS — I5022 Chronic systolic (congestive) heart failure: Secondary | ICD-10-CM | POA: Diagnosis not present

## 2022-05-29 DIAGNOSIS — I255 Ischemic cardiomyopathy: Secondary | ICD-10-CM | POA: Diagnosis not present

## 2022-05-29 DIAGNOSIS — I10 Essential (primary) hypertension: Secondary | ICD-10-CM

## 2022-05-29 DIAGNOSIS — Z01810 Encounter for preprocedural cardiovascular examination: Secondary | ICD-10-CM

## 2022-05-29 DIAGNOSIS — Z955 Presence of coronary angioplasty implant and graft: Secondary | ICD-10-CM

## 2022-05-29 DIAGNOSIS — Z992 Dependence on renal dialysis: Secondary | ICD-10-CM

## 2022-05-29 DIAGNOSIS — S82892A Other fracture of left lower leg, initial encounter for closed fracture: Secondary | ICD-10-CM | POA: Diagnosis present

## 2022-05-29 DIAGNOSIS — Z794 Long term (current) use of insulin: Secondary | ICD-10-CM | POA: Diagnosis not present

## 2022-05-29 DIAGNOSIS — I2510 Atherosclerotic heart disease of native coronary artery without angina pectoris: Secondary | ICD-10-CM | POA: Diagnosis not present

## 2022-05-29 DIAGNOSIS — E669 Obesity, unspecified: Secondary | ICD-10-CM

## 2022-05-29 DIAGNOSIS — J449 Chronic obstructive pulmonary disease, unspecified: Secondary | ICD-10-CM | POA: Diagnosis present

## 2022-05-29 DIAGNOSIS — I25118 Atherosclerotic heart disease of native coronary artery with other forms of angina pectoris: Secondary | ICD-10-CM

## 2022-05-29 DIAGNOSIS — E785 Hyperlipidemia, unspecified: Secondary | ICD-10-CM

## 2022-05-29 DIAGNOSIS — Z950 Presence of cardiac pacemaker: Secondary | ICD-10-CM

## 2022-05-29 DIAGNOSIS — E1122 Type 2 diabetes mellitus with diabetic chronic kidney disease: Secondary | ICD-10-CM | POA: Diagnosis present

## 2022-05-29 DIAGNOSIS — D631 Anemia in chronic kidney disease: Secondary | ICD-10-CM

## 2022-05-29 DIAGNOSIS — D696 Thrombocytopenia, unspecified: Secondary | ICD-10-CM | POA: Diagnosis present

## 2022-05-29 DIAGNOSIS — G4730 Sleep apnea, unspecified: Secondary | ICD-10-CM

## 2022-05-29 DIAGNOSIS — I442 Atrioventricular block, complete: Secondary | ICD-10-CM

## 2022-05-29 DIAGNOSIS — W19XXXA Unspecified fall, initial encounter: Secondary | ICD-10-CM | POA: Diagnosis not present

## 2022-05-29 DIAGNOSIS — I480 Paroxysmal atrial fibrillation: Secondary | ICD-10-CM | POA: Diagnosis present

## 2022-05-29 DIAGNOSIS — Z66 Do not resuscitate: Secondary | ICD-10-CM | POA: Diagnosis present

## 2022-05-29 DIAGNOSIS — E875 Hyperkalemia: Secondary | ICD-10-CM | POA: Diagnosis present

## 2022-05-29 DIAGNOSIS — I950 Idiopathic hypotension: Secondary | ICD-10-CM

## 2022-05-29 DIAGNOSIS — W010XXA Fall on same level from slipping, tripping and stumbling without subsequent striking against object, initial encounter: Secondary | ICD-10-CM | POA: Diagnosis present

## 2022-05-29 DIAGNOSIS — E7800 Pure hypercholesterolemia, unspecified: Secondary | ICD-10-CM | POA: Diagnosis present

## 2022-05-29 DIAGNOSIS — S82892D Other fracture of left lower leg, subsequent encounter for closed fracture with routine healing: Secondary | ICD-10-CM | POA: Diagnosis not present

## 2022-05-29 DIAGNOSIS — I132 Hypertensive heart and chronic kidney disease with heart failure and with stage 5 chronic kidney disease, or end stage renal disease: Secondary | ICD-10-CM | POA: Diagnosis present

## 2022-05-29 DIAGNOSIS — I9589 Other hypotension: Secondary | ICD-10-CM | POA: Diagnosis present

## 2022-05-29 DIAGNOSIS — I25708 Atherosclerosis of coronary artery bypass graft(s), unspecified, with other forms of angina pectoris: Secondary | ICD-10-CM | POA: Diagnosis not present

## 2022-05-29 DIAGNOSIS — M80072A Age-related osteoporosis with current pathological fracture, left ankle and foot, initial encounter for fracture: Secondary | ICD-10-CM | POA: Diagnosis present

## 2022-05-29 DIAGNOSIS — Z95810 Presence of automatic (implantable) cardiac defibrillator: Secondary | ICD-10-CM | POA: Diagnosis not present

## 2022-05-29 DIAGNOSIS — N186 End stage renal disease: Secondary | ICD-10-CM

## 2022-05-29 DIAGNOSIS — Z87891 Personal history of nicotine dependence: Secondary | ICD-10-CM | POA: Diagnosis not present

## 2022-05-29 DIAGNOSIS — Z79899 Other long term (current) drug therapy: Secondary | ICD-10-CM | POA: Diagnosis not present

## 2022-05-29 DIAGNOSIS — E1142 Type 2 diabetes mellitus with diabetic polyneuropathy: Secondary | ICD-10-CM | POA: Diagnosis present

## 2022-05-29 DIAGNOSIS — N2581 Secondary hyperparathyroidism of renal origin: Secondary | ICD-10-CM | POA: Diagnosis present

## 2022-05-29 DIAGNOSIS — I5042 Chronic combined systolic (congestive) and diastolic (congestive) heart failure: Secondary | ICD-10-CM | POA: Diagnosis present

## 2022-05-29 DIAGNOSIS — S93432A Sprain of tibiofibular ligament of left ankle, initial encounter: Secondary | ICD-10-CM | POA: Diagnosis present

## 2022-05-29 DIAGNOSIS — I080 Rheumatic disorders of both mitral and aortic valves: Secondary | ICD-10-CM | POA: Diagnosis present

## 2022-05-29 LAB — CBC WITH DIFFERENTIAL/PLATELET
Abs Immature Granulocytes: 0.02 10*3/uL (ref 0.00–0.07)
Basophils Absolute: 0 10*3/uL (ref 0.0–0.1)
Basophils Relative: 1 %
Eosinophils Absolute: 0.3 10*3/uL (ref 0.0–0.5)
Eosinophils Relative: 6 %
HCT: 35.7 % — ABNORMAL LOW (ref 39.0–52.0)
Hemoglobin: 11.7 g/dL — ABNORMAL LOW (ref 13.0–17.0)
Immature Granulocytes: 0 %
Lymphocytes Relative: 22 %
Lymphs Abs: 1.1 10*3/uL (ref 0.7–4.0)
MCH: 34.8 pg — ABNORMAL HIGH (ref 26.0–34.0)
MCHC: 32.8 g/dL (ref 30.0–36.0)
MCV: 106.3 fL — ABNORMAL HIGH (ref 80.0–100.0)
Monocytes Absolute: 0.7 10*3/uL (ref 0.1–1.0)
Monocytes Relative: 14 %
Neutro Abs: 2.8 10*3/uL (ref 1.7–7.7)
Neutrophils Relative %: 57 %
Platelets: 120 10*3/uL — ABNORMAL LOW (ref 150–400)
RBC: 3.36 MIL/uL — ABNORMAL LOW (ref 4.22–5.81)
RDW: 15 % (ref 11.5–15.5)
WBC: 5 10*3/uL (ref 4.0–10.5)
nRBC: 0 % (ref 0.0–0.2)

## 2022-05-29 LAB — BASIC METABOLIC PANEL
Anion gap: 16 — ABNORMAL HIGH (ref 5–15)
BUN: 23 mg/dL (ref 8–23)
CO2: 27 mmol/L (ref 22–32)
Calcium: 9.3 mg/dL (ref 8.9–10.3)
Chloride: 96 mmol/L — ABNORMAL LOW (ref 98–111)
Creatinine, Ser: 6.9 mg/dL — ABNORMAL HIGH (ref 0.61–1.24)
GFR, Estimated: 7 mL/min — ABNORMAL LOW (ref >60)
Glucose, Bld: 124 mg/dL — ABNORMAL HIGH (ref 70–99)
Potassium: 5 mmol/L (ref 3.5–5.1)
Sodium: 139 mmol/L (ref 135–145)

## 2022-05-29 LAB — PHOSPHORUS: Phosphorus: 4.3 mg/dL (ref 2.5–4.6)

## 2022-05-29 MED ORDER — OXYCODONE-ACETAMINOPHEN 5-325 MG PO TABS
1.0000 | ORAL_TABLET | Freq: Four times a day (QID) | ORAL | Status: DC | PRN
  Administered 2022-05-30 – 2022-06-01 (×3): 1 via ORAL
  Filled 2022-05-29 (×5): qty 1

## 2022-05-29 MED ORDER — KETAMINE HCL 50 MG/5ML IJ SOSY
0.3000 mg/kg | PREFILLED_SYRINGE | Freq: Once | INTRAMUSCULAR | Status: AC
  Administered 2022-05-29: 20 mg via INTRAVENOUS
  Filled 2022-05-29: qty 5

## 2022-05-29 MED ORDER — MORPHINE SULFATE (PF) 2 MG/ML IV SOLN: 0.5000 mg | INTRAVENOUS | Status: DC | PRN

## 2022-05-29 MED ORDER — HYDROCODONE-ACETAMINOPHEN 5-325 MG PO TABS: 1.0000 | ORAL_TABLET | Freq: Four times a day (QID) | ORAL | Status: DC | PRN

## 2022-05-29 MED ORDER — SACCHAROMYCES BOULARDII 250 MG PO CAPS: 250.0000 mg | ORAL_CAPSULE | Freq: Every evening | ORAL | Status: DC

## 2022-05-29 MED ORDER — CARVEDILOL 3.125 MG PO TABS
3.1250 mg | ORAL_TABLET | ORAL | Status: DC
  Filled 2022-05-29: qty 1

## 2022-05-29 MED ORDER — GABAPENTIN 100 MG PO CAPS
100.0000 mg | ORAL_CAPSULE | Freq: Two times a day (BID) | ORAL | Status: AC
  Administered 2022-05-29 – 2022-06-04 (×12): 100 mg via ORAL
  Filled 2022-05-29 (×12): qty 1

## 2022-05-29 MED ORDER — HEPARIN SODIUM (PORCINE) 5000 UNIT/ML IJ SOLN
5000.0000 [IU] | Freq: Three times a day (TID) | INTRAMUSCULAR | Status: DC
  Administered 2022-05-30 – 2022-06-09 (×30): 5000 [IU] via SUBCUTANEOUS
  Filled 2022-05-29 (×31): qty 1

## 2022-05-29 MED ORDER — SEVELAMER CARBONATE 800 MG PO TABS
1600.0000 mg | ORAL_TABLET | ORAL | Status: DC
  Administered 2022-05-30 – 2022-06-02 (×5): 1600 mg via ORAL
  Filled 2022-05-29 (×3): qty 2

## 2022-05-29 MED ORDER — GABAPENTIN 100 MG PO CAPS: 100.0000 mg | ORAL_CAPSULE | ORAL | Status: DC

## 2022-05-29 MED ORDER — SEVELAMER CARBONATE 800 MG PO TABS
2400.0000 mg | ORAL_TABLET | Freq: Two times a day (BID) | ORAL | Status: DC
  Administered 2022-05-29 – 2022-06-08 (×13): 2400 mg via ORAL
  Filled 2022-05-29 (×19): qty 3

## 2022-05-29 MED ORDER — CALCITRIOL 0.5 MCG PO CAPS: 1.7500 ug | ORAL_CAPSULE | ORAL | Status: DC

## 2022-05-29 MED ORDER — OXYCODONE-ACETAMINOPHEN 5-325 MG PO TABS
1.0000 | ORAL_TABLET | Freq: Once | ORAL | Status: AC
  Administered 2022-05-29: 1 via ORAL
  Filled 2022-05-29: qty 1

## 2022-05-29 MED ORDER — ASPIRIN 81 MG PO TBEC
81.0000 mg | DELAYED_RELEASE_TABLET | Freq: Every day | ORAL | Status: DC
  Administered 2022-05-29 – 2022-06-01 (×4): 81 mg via ORAL
  Filled 2022-05-29 (×4): qty 1

## 2022-05-29 MED ORDER — HYDROMORPHONE HCL 1 MG/ML IJ SOLN
0.5000 mg | INTRAMUSCULAR | Status: DC | PRN
  Administered 2022-05-29 – 2022-06-02 (×8): 0.5 mg via INTRAVENOUS
  Filled 2022-05-29 (×9): qty 1

## 2022-05-29 MED ORDER — ISOSORBIDE MONONITRATE ER 30 MG PO TB24
15.0000 mg | ORAL_TABLET | Freq: Every day | ORAL | Status: DC
  Administered 2022-05-29 – 2022-06-09 (×10): 15 mg via ORAL
  Filled 2022-05-29 (×11): qty 1

## 2022-05-29 MED ORDER — OXYMETAZOLINE HCL 0.05 % NA SOLN: 3.0000 | NASAL | Status: AC | PRN

## 2022-05-29 MED ORDER — CARVEDILOL 3.125 MG PO TABS
3.1250 mg | ORAL_TABLET | ORAL | Status: DC
  Administered 2022-05-30 – 2022-05-31 (×3): 3.125 mg via ORAL
  Filled 2022-05-29 (×5): qty 1

## 2022-05-29 MED ORDER — TICAGRELOR 90 MG PO TABS
90.0000 mg | ORAL_TABLET | Freq: Two times a day (BID) | ORAL | Status: DC
  Administered 2022-05-29 – 2022-06-09 (×21): 90 mg via ORAL
  Filled 2022-05-29 (×21): qty 1

## 2022-05-29 MED ORDER — POLYVINYL ALCOHOL 1.4 % OP SOLN: 1.0000 [drp] | OPHTHALMIC | Status: DC | PRN

## 2022-05-29 MED ORDER — SEVELAMER CARBONATE 800 MG PO TABS: 800.0000 mg | ORAL_TABLET | ORAL | Status: DC

## 2022-05-29 MED ORDER — CARBOXYMETHYLCELLUL-GLYCERIN 0.5-0.9 % OP SOLN: 1.0000 [drp] | Freq: Three times a day (TID) | OPHTHALMIC | Status: DC

## 2022-05-29 MED ORDER — RENA-VITE PO TABS
1.0000 | ORAL_TABLET | Freq: Every day | ORAL | Status: DC
  Administered 2022-05-29 – 2022-06-09 (×11): 1 via ORAL
  Filled 2022-05-29 (×11): qty 1

## 2022-05-29 MED ORDER — SENNA 8.6 MG PO TABS
1.0000 | ORAL_TABLET | Freq: Every day | ORAL | Status: DC
  Administered 2022-05-29 – 2022-06-08 (×11): 8.6 mg via ORAL
  Filled 2022-05-29 (×11): qty 1

## 2022-05-29 MED ORDER — MIDODRINE HCL 5 MG PO TABS
10.0000 mg | ORAL_TABLET | ORAL | Status: DC
  Administered 2022-06-01 – 2022-06-09 (×6): 10 mg via ORAL
  Filled 2022-05-29 (×6): qty 2

## 2022-05-29 MED ORDER — VITAMIN B-12 1000 MCG PO TABS
1000.0000 ug | ORAL_TABLET | Freq: Every day | ORAL | Status: DC
  Administered 2022-05-30 – 2022-06-09 (×10): 1000 ug via ORAL
  Filled 2022-05-29 (×10): qty 1

## 2022-05-29 MED ORDER — CHLORHEXIDINE GLUCONATE CLOTH 2 % EX PADS
6.0000 | MEDICATED_PAD | Freq: Every day | CUTANEOUS | Status: DC
  Administered 2022-05-31 – 2022-06-07 (×4): 6 via TOPICAL

## 2022-05-29 MED ORDER — MELATONIN 3 MG PO TABS
3.0000 mg | ORAL_TABLET | Freq: Every day | ORAL | Status: DC
  Administered 2022-05-29 – 2022-06-08 (×11): 3 mg via ORAL
  Filled 2022-05-29 (×13): qty 1

## 2022-05-29 MED ORDER — ATORVASTATIN CALCIUM 80 MG PO TABS
80.0000 mg | ORAL_TABLET | Freq: Every day | ORAL | Status: DC
  Administered 2022-05-29 – 2022-06-08 (×11): 80 mg via ORAL
  Filled 2022-05-29 (×11): qty 1

## 2022-05-29 MED ORDER — METHOCARBAMOL 500 MG PO TABS: 1000.0000 mg | ORAL_TABLET | Freq: Four times a day (QID) | ORAL | Status: DC | PRN

## 2022-05-29 MED ORDER — RISAQUAD PO CAPS
1.0000 | ORAL_CAPSULE | Freq: Every day | ORAL | Status: DC
  Administered 2022-05-29 – 2022-06-09 (×11): 1 via ORAL
  Filled 2022-05-29 (×11): qty 1

## 2022-05-29 MED ORDER — GABAPENTIN 100 MG PO CAPS
100.0000 mg | ORAL_CAPSULE | Freq: Every day | ORAL | Status: DC
  Administered 2022-05-31 – 2022-06-08 (×5): 100 mg via ORAL
  Filled 2022-05-29 (×4): qty 1

## 2022-05-29 MED ORDER — EZETIMIBE 10 MG PO TABS
10.0000 mg | ORAL_TABLET | Freq: Every day | ORAL | Status: DC
  Administered 2022-05-29 – 2022-06-09 (×11): 10 mg via ORAL
  Filled 2022-05-29 (×11): qty 1

## 2022-05-29 MED ORDER — PANTOPRAZOLE SODIUM 40 MG PO TBEC
40.0000 mg | DELAYED_RELEASE_TABLET | Freq: Every day | ORAL | Status: DC
  Administered 2022-05-29 – 2022-06-09 (×11): 40 mg via ORAL
  Filled 2022-05-29 (×12): qty 1

## 2022-05-29 MED ORDER — HYDROMORPHONE HCL 1 MG/ML IJ SOLN
0.5000 mg | Freq: Once | INTRAMUSCULAR | Status: AC
  Administered 2022-05-29: 0.5 mg via INTRAVENOUS
  Filled 2022-05-29: qty 1

## 2022-05-29 MED ORDER — CARVEDILOL 3.125 MG PO TABS: 3.1250 mg | ORAL_TABLET | ORAL | Status: DC

## 2022-05-29 NOTE — H&P (Addendum)
History and Physical    Patient: Rodney Farley JJK:[id number] DOB: 10/29/1931 DOA: 05/28/2022 DOS: the patient was seen and examined on 05/29/2022 PCP: [name], MD  Patient coming from: Home via EMS.  Chief Complaint:  Chief Complaint  Patient presents with   Leg Injury   HPI: Rodney Farley is a 88 y.o. male with medical history significant of ESRD on HD MWF, CHF s/p AICD, CHB s/p CRT-D PPM, PAF, CAD, DM, HTN, ALS, chronic hypotension on midodrine, Bell's palsy, OSA, and obesity who presents after having a fall at home last night.  Normally patient ambulates with the use of a cane/walker and lives alone.  He had moved a stack of papers off the table to beside his chair that he normally sits to clear some space  a couple weeks ago when a nurse came to his house.  He reports never move the papers and had been intermittently knocking into them.  However, last night he slipped on the papers causing him to fall.  He reported having severe pain in his legs, but most notably the left ankle.  He did not hit his head or lose consciousness.  He has been going to dialysis and last dialyzed on 2 days ago and is due for dialysis today.  Denies having any chest pain, palpitations, shortness of breath, nausea, vomiting, or diarrhea symptoms.   Patient had just recently been hospitalized from 10/25-10/27 for complaints of chest pain found to have restenosis in the ostial to proximal LAD with successful balloon angioplasty.  Patient was continued on aspirin 81 mg, switched from Plavix to Brilinta, and Zetia added to Lipitor for goal LDL less than 50.  He reports that he had been doing well otherwise and was taking his medications as advised.  In the emergency department patient was noted to be afebrile with blood pressures 103/44-166/74.  Labs from 11/16 noted hemoglobin 11.7 with elevated MCV/MCH, platelets 120, potassium 5, BUN 23, and creatinine 6.9.  Patient had been given oxycodone  milligrams p.o., ketamine to reduce the ankle fracture, and Dilaudid 0.5 mg.  Review of Systems: As mentioned in the history of present illness. All other systems reviewed and are negative. Past Medical History:  Diagnosis Date   AICD (automatic cardioverter/defibrillator) present    [employer] pacer   Anemia    Anxiety and depression    Arthritis    Bell's palsy    left side. after shingles episode   BPH associated with nocturia    Chronic systolic CHF (congestive heart failure) ([place])    EF normalized by Echo 2019   COPD (chronic obstructive pulmonary disease) ([place])    Severe   Coronary artery disease    a. s/p MI in 1994/1995 while in [place] s/p questionable PCI. 03/2015: progression of disease, for staged PCI.   Diabetic peripheral neuropathy (HCC)    GERD (gastroesophageal reflux disease)    Gout    Hard of hearing    B/L   History of chronic pancreatitis 07/23/2017   noted on CT abd/pelvis   History of shingles    Hypercholesterolemia    Hypertension    Obesity    Sleep apnea    "sleeps w/humidifyer when he panics and gets short of breath" (04/08/2015)   TIA (transient ischemic attack) X 3   Trigger middle finger of left hand    Type II diabetes mellitus ([place])    Wears glasses    Wears hearing aid    Past Surgical History:  Procedure Laterality Date   APPENDECTOMY     AV FISTULA PLACEMENT Right 02/07/2019   Procedure: ARTERIOVENOUS (AV) FISTULA CREATION RIGHT UPPER ARM;  Surgeon: Waynetta Sandy, MD;  Location: [facility];  Service: Vascular;  Laterality: Right;   BIV ICD GENERATOR CHANGEOUT N/A 10/09/2021   Procedure: BIV ICD GENERATOR CHANGEOUT;  Surgeon: Deboraha Sprang, MD;  Location: [facility];  Service: Cardiovascular;  Laterality: N/A;   CARDIAC CATHETERIZATION N/A 03/29/2015   Procedure: Right/Left Heart Cath and Coronary Angiography;  Surgeon: Hendry M [name], MD;  Location: [facility];  Service: Cardiovascular;  Laterality: N/A;   [place]   "after my MI; put me on heart RX after cath"   CARDIAC CATHETERIZATION N/A 04/09/2015   Procedure: Coronary Stent Intervention;  Surgeon: Shashwat M [name], MD;  Location: [facility];  Service: Cardiovascular;  Laterality: N/A;   CATARACT EXTRACTION W/ INTRAOCULAR LENS  IMPLANT, BILATERAL     CHOLECYSTECTOMY N/A 10/13/2017   Procedure: LAPAROSCOPIC CHOLECYSTECTOMY WITH LYSIS OF ADHESIONS;  Surgeon: Ileana Roup, MD;  Location: [facility];  Service: General;  Laterality: N/A;   COLONOSCOPY     CORONARY ANGIOGRAPHY N/A 11/12/2021   Procedure: CORONARY ANGIOGRAPHY;  Surgeon: Wellington Hampshire, MD;  Location: [facility];  Service: Cardiovascular;  Laterality: N/A;   CORONARY ANGIOGRAPHY N/A 12/24/2021   Procedure: CORONARY ANGIOGRAPHY;  Surgeon: Troy Sine, MD;  Location: [facility];  Service: Cardiovascular;  Laterality: N/A;   CORONARY ANGIOGRAPHY N/A 03/31/2022   Procedure: CORONARY ANGIOGRAPHY;  Surgeon: Jettie Booze, MD;  Location: [facility];  Service: Cardiovascular;  Laterality: N/A;   CORONARY ANGIOGRAPHY N/A 05/07/2022   Procedure: CORONARY ANGIOGRAPHY;  Surgeon: Jettie Booze, MD;  Location: [facility];  Service: Cardiovascular;  Laterality: N/A;   CORONARY BALLOON ANGIOPLASTY N/A 12/24/2021   Procedure: CORONARY BALLOON ANGIOPLASTY;  Surgeon: Troy Sine, MD;  Location: [facility];  Service: Cardiovascular;  Laterality: N/A;   CORONARY BALLOON ANGIOPLASTY N/A 02/09/2022   Procedure: CORONARY BALLOON ANGIOPLASTY;  Surgeon: Nelva Bush, MD;  Location: [facility];  Service: Cardiovascular;  Laterality: N/A;  LAD   CORONARY BALLOON ANGIOPLASTY N/A 03/31/2022   Procedure: CORONARY BALLOON ANGIOPLASTY;  Surgeon: Jettie Booze, MD;  Location: [facility];  Service: Cardiovascular;  Laterality: N/A;   CORONARY BALLOON ANGIOPLASTY N/A 05/07/2022   Procedure: CORONARY BALLOON  ANGIOPLASTY;  Surgeon: Jettie Booze, MD;  Location: [facility];  Service: Cardiovascular;  Laterality: N/A;   CORONARY STENT INTERVENTION N/A 11/12/2021   Procedure: CORONARY STENT INTERVENTION;  Surgeon: Wellington Hampshire, MD;  Location: [facility];  Service: Cardiovascular;  Laterality: N/A;  LAD   DENTAL SURGERY     EP IMPLANTABLE DEVICE N/A 09/23/2015   MDT CRT-D, Dr. Caryl Comes   HIATAL HERNIA REPAIR  1977   ILEOCECETOMY N/A 03/27/2017   Procedure: ILEOCECECTOMY;  Surgeon: Ileana Roup, MD;  Location: [facility];  Service: General;  Laterality: N/A;   INSERT / REPLACE / REMOVE PACEMAKER  07/2008   Complete heart block status post DDD with good function   INTRAVASCULAR IMAGING/OCT N/A 12/24/2021   Procedure: INTRAVASCULAR IMAGING/OCT;  Surgeon: Troy Sine, MD;  Location: [facility];  Service: Cardiovascular;  Laterality: N/A;   INTRAVASCULAR LITHOTRIPSY  02/09/2022   Procedure: INTRAVASCULAR LITHOTRIPSY;  Surgeon: Nelva Bush, MD;  Location: [facility];  Service: Cardiovascular;;  LAD   INTRAVASCULAR ULTRASOUND/IVUS N/A 02/09/2022   Procedure: Intravascular Ultrasound/IVUS;  Surgeon: Nelva Bush, MD;  Location: [facility];  Service: Cardiovascular;  Laterality: N/A;  LAD   IR FLUORO GUIDE CV LINE RIGHT  04/03/2019   IR US GUIDE VASC ACCESS RIGHT  04/03/2019   LAPAROTOMY N/A 03/27/2017   Procedure: EXPLORATORY LAPAROTOMY;  Surgeon: Ileana Roup, MD;  Location: [facility];  Service: General;  Laterality: N/A;   RIGHT HEART CATH AND CORONARY ANGIOGRAPHY N/A 02/09/2022   Procedure: RIGHT HEART CATH AND CORONARY ANGIOGRAPHY;  Surgeon: Nelva Bush, MD;  Location: [facility];  Service: Cardiovascular;  Laterality: N/A;   TONSILLECTOMY     UPPER GI ENDOSCOPY     Social History:  reports that he quit smoking about 14 years ago. His smoking use included cigarettes. He has a 81.00 pack-year smoking history. He has never been exposed  to tobacco smoke. He has never used smokeless tobacco. He reports current alcohol use. He reports that he does not use drugs.  Allergies  Allergen Reactions   Bee Venom Anaphylaxis   Lyrica [Pregabalin] Other (See Comments)    hallucinations   Prednisone Other (See Comments)    hallucinations   Zocor [Simvastatin] Nausea Only and Other (See Comments)    Headache with brand name only.  Can take the generic.    Family History  Problem Relation Age of Onset   Stroke Mother    Leukemia Father    Stroke Sister    Heart attack Brother     Prior to Admission medications   Medication Sig Start Date End Date Taking? Authorizing Provider  aspirin EC 81 MG tablet Take 81 mg by mouth daily.    [provider]  atorvastatin (LIPITOR) 80 MG tablet Take 1 tablet by mouth daily. 11/14/21   Margie Billet, PA-C  calcitRIOL (ROCALTROL) 0.25 MCG capsule Take 7 capsules (1.75 mcg total) by mouth every Monday, Wednesday, and Friday with hemodialysis. 01/26/22   [name], De M, MD  carbamide peroxide (DEBROX) 6.5 % OTIC solution Place 5 drops into both ears 2 (two) times daily as needed (earwax). 11/13/21   Lelon Perla, MD  Carboxymethylcellul-Glycerin (LUBRICATING EYE DROPS OP) Place 1 drop into both eyes as needed (dry eyes).    [provider]  carvedilol (COREG) 3.125 MG tablet Take 1 tablet (3.125 mg total) by mouth 2 (two) times daily with a meal. Hold for SBP <100 mmhg OR hr < 60/min 05/08/22 06/07/22  Antonieta Pert, MD  Evolocumab (REPATHA SURECLICK) 878 MG/ML SOAJ Inject 140 mg into the skin every 14 (fourteen) days. 04/22/22   [name], Braeden M, MD  ezetimibe (ZETIA) 10 MG tablet Take 1 tablet (10 mg total) by mouth daily. 05/08/22   Antonieta Pert, MD  gabapentin (NEURONTIN) 100 MG capsule Take 258m twice daily for 1 week Then 100 mg twice daily for a week Then 100 mg before bed for a week Then stop Patient taking differently: Take 200 mg by mouth daily. Take 2067mtwice daily  for 1 week Then 100 mg twice daily for a week Then 100 mg before bed for a week Then stop 05/19/22   HuMarin OlpMD  glucose blood (FREESTYLE TEST STRIPS) test strip Use to check blood sugar daily 05/01/19   HuMarin OlpMD  glucose monitoring kit (FREESTYLE) monitoring kit USE TO MONITOR BLOOD GLUCOSE AS DIRECTED 05/01/19   HuMarin OlpMD  isosorbide mononitrate (IMDUR) 30 MG 24 hr tablet Take 1/2 tablet (15 mg total) by mouth daily. 05/09/22 06/08/22  Antonieta Pert, MD  Methoxy PEG-Epoetin Beta (MIRCERA IJ) as directed. 02/26/21 07/15/22  [provider]  midodrine (PROAMATINE) 10 MG tablet Take 0.5-1 tablets (5-10 mg total) by mouth See admin instructions. Take 1 tablet (10 mg) three times a week. Add 5 mg a night after dialysis as needed for low blood pressure. 04/02/22   Aline August, MD  multivitamin (RENA-VIT) TABS tablet Take 1 tablet by mouth daily. 08/22/21   [name], MD  nitroGLYCERIN (NITROSTAT) 0.4 MG SL tablet Place 1 tablet under the tongue every 5 minutes x 3 doses as needed for chest pain. 11/13/21   Margie Billet, PA-C  oxymetazoline (AFRIN) 0.05 % nasal spray Place 1 spray into both nostrils 2 (two) times daily as needed for congestion. 12/29/21   [name], MD  pantoprazole (PROTONIX) 40 MG tablet Take 1 tablet by mouth daily. 11/14/21   Margie Billet, PA-C  saccharomyces boulardii (FLORASTOR) 250 MG capsule Take 1 capsule (250 mg total) by mouth 2 (two) times daily. 04/08/19   Regalado, Belkys A, MD  sevelamer carbonate (RENVELA) 800 MG tablet Take 2 tablets (1,600 mg total) by mouth 3 (three) times daily with meals. Patient taking differently: Take 1,600 mg by mouth See admin instructions. Take 2 tablets by mouth three times daily with meals and 1 tablet with snacks 11/13/21   Lelon Perla, MD  ticagrelor (BRILINTA) 90 MG TABS tablet Take 1 tablet (90 mg total) by mouth 2 (two) times daily. 05/08/22   Antonieta Pert, MD  vitamin B-12  (CYANOCOBALAMIN) 1000 MCG tablet Take 1,000 mcg by mouth daily.    [provider]    Physical Exam: Vitals:   05/29/22 0630 05/29/22 0800 05/29/22 0807 05/29/22 0824  BP: (!) 134/52 (!) 124/57    Pulse: 77 80    Resp: 16 16    Temp:   98.3 F (36.8 C)   TempSrc:   Oral   SpO2: 96% 93%    Weight:    96.6 kg  Height:    5' 7" (1.702 m)   Exam  Constitutional: Elderly male currently in no acute distress Eyes: Ptosis of left eyelid ENMT: Mucous membranes are moist.   Neck: normal, supple  Respiratory: No significant wheezes or rhonchi appreciated at this time.  Otherwise normal respiratory effort. Cardiovascular: Regular rhythm.  No significant lower extremity swelling appreciated at this time. Abdomen: No tenderness to palpation appreciated.  Bowel sounds positive.  Musculoskeletal: no clubbing / cyanosis.  Left lower extremity currently in cast. Skin: no rashes, lesions, ulcers.  Neurologic: CN 2-12 grossly intact. Sensation intact, DTR normal. Strength 5/5 in all 4.  Psychiatric: Normal judgment and insight. Alert and oriented x 3. Normal mood.   Data Reviewed:  EKG revealed a AV dual paced rhythm at 80 bpm.  Reviewed labs, imaging and pertinent records as noted above in HPI  Assessment and Plan:  Ankle fracture secondary to fall Patient presents after slipping on papers by his chair.  Found to have left ankle fracture which was reduced by the ED provider.  Orthopedics consulted and plan on surgery sometime next week. -Admit to a surgical telemetry bed -Nonweightbearing on affected extremity -Oxycodone/Dilaudid IV as needed for moderate to severe pain respectively -PT consulted to evaluate and treat -TOC consulted -Orthopedics consulted, will follow-up for any further recommendations  Hypotension Chronic.  Patient has  history of hypertension and is on midodrine.  Initial blood pressures noted to be as low as 103/44. -Continue midodrine -Goal maps greater than  65  ESRD on HD Patient dialyzes Monday, Wednesday, Friday, and is due for dialysis today.  Last dialyzed on 11/15.  Labs 11/16 noted potassium 5, BUN 23, and creatinine 6.9. -Check renal function panel daily -Dr. Jonnie Finner of nephrology consulted for need of dialysis  Systolic CHF ischemic cardiomyopathy status post AICD Chronic.  Patient does not appear overtly fluid overloaded at this time.  Last echocardiogram noted EF to be 45 -50% with indeterminate diastolic parameters.   -Fluid management with hemodialysis  CAD Patient with history of multivessel coronary artery disease.  Underwent left heart cath on 10/26 due to chest pain with troponins elevated in the 200s found to have another episode of restenosis of the ostial to proximal LAD with successful balloon angioplasty.  Patient denies any chest pain at this time. -Continue aspirin, Brilinta, and statin -Orthopedics pending cardiac clearance prior to procedure  Anemia of chronic kidney disease Hemoglobin 11.7 g/dL with MCV 106.3 and MCH 34.8.  Patient is on vitamin B12 supplementation and EPO shots with dialysis. -Continue vitamin B12 supplementation  Complete heart block s/p permanent pacemaker -Continue telemetry monitoring  Diabetes mellitus type II, without long-term use of insulin On admission glucose 124.  Last available hemoglobin A1c 7.6 on 11/11/2021.  Patient had not been on any medications for treatment. -Check hemoglobin A1c in a.m. -Consider need of starting very sensitive sliding scale insulin if blood sugars are elevated greater than 180  Hyperlipidemia -Goal LDL less than 50 -Continue atorvastatin and Zetia -Resume Repatha in outpatient setting  Thrombocytopenia Chronic.  Platelet count 120 which appears slightly lower than prior. -Continue to monitor  OSA Patient reports that he does not use CPAP at night.  Obesity BMI 33.36 kg/m  DVT prophylaxis: Heparin.  Monitor platelet counts Advance Care Planning:    Code Status: Full Code patient confirmed of his heart.  He would at least want a full attempt at resuscitation  Consults: Orthopedic  Family Communication: No family request to be updated at this time  Severity of Illness: The appropriate patient status for this patient is INPATIENT. Inpatient status is judged to be reasonable and necessary in order to provide the required intensity of service to ensure the patient's safety. [name] presenting symptoms, physical exam findings, and initial radiographic and laboratory data in the context of their chronic comorbidities is felt to place them at high risk for further clinical deterioration. Furthermore, it is not anticipated that the patient will be medically stable for discharge from the hospital within 2 midnights of admission.   * I certify that at the point of admission it is my clinical judgment that the patient will require inpatient hospital care spanning beyond 2 midnights from the point of admission due to high intensity of service, high risk for further deterioration and high frequency of surveillance required.*  Author: Norval Morton, MD 05/29/2022 8:33 AM  For on call review [email or web address].

## 2022-05-29 NOTE — Consult Note (Signed)
Renal Service Consult Note [facility]  Rodney Farley 05/29/2022 [name], MD Requesting Physician: Dr. Harvest Forest  Reason for Consult: ESRD pt w/ fall and ankle fracture HPI: The patient is a 88 y.o. year-old w/ PMH as below presented after falling at home. Was found to have L ankle fracture. Orthopedics planning surgery next week. Asked to see for ESRD.    Pt seen in room.  No c/o at this time.   ROS - denies CP, no joint pain, no HA, no blurry vision, no rash, no diarrhea, no nausea/ vomiting, no dysuria, no difficulty voiding   Past Medical History  Past Medical History:  Diagnosis Date   AICD (automatic cardioverter/defibrillator) present    [employer] pacer   Anemia    Anxiety and depression    Arthritis    Bell's palsy    left side. after shingles episode   BPH associated with nocturia    Chronic systolic CHF (congestive heart failure) ([place])    EF normalized by Echo 2019   COPD (chronic obstructive pulmonary disease) ([place])    Severe   Coronary artery disease    a. s/p MI in 1994/1995 while in [place] s/p questionable PCI. 03/2015: progression of disease, for staged PCI.   Diabetic peripheral neuropathy (HCC)    GERD (gastroesophageal reflux disease)    Gout    Hard of hearing    B/L   History of chronic pancreatitis 07/23/2017   noted on CT abd/pelvis   History of shingles    Hypercholesterolemia    Hypertension    Obesity    Sleep apnea    "sleeps w/humidifyer when he panics and gets short of breath" (04/08/2015)   TIA (transient ischemic attack) X 3   Trigger middle finger of left hand    Type II diabetes mellitus ([place])    Wears glasses    Wears hearing aid    Past Surgical History  Past Surgical History:  Procedure Laterality Date   APPENDECTOMY     AV FISTULA PLACEMENT Right 02/07/2019   Procedure: ARTERIOVENOUS (AV) FISTULA CREATION RIGHT UPPER ARM;  Surgeon: Waynetta Sandy, MD;  Location: [place];  Service:  Vascular;  Laterality: Right;   BIV ICD GENERATOR CHANGEOUT N/A 10/09/2021   Procedure: BIV ICD GENERATOR CHANGEOUT;  Surgeon: Deboraha Sprang, MD;  Location: [place] [facility];  Service: Cardiovascular;  Laterality: N/A;   CARDIAC CATHETERIZATION N/A 03/29/2015   Procedure: Right/Left Heart Cath and Coronary Angiography;  Surgeon: Tameron M [name], MD;  Location: [place] [facility];  Service: Cardiovascular;  Laterality: N/A;   Farmer   "after my MI; put me on heart RX after cath"   CARDIAC CATHETERIZATION N/A 04/09/2015   Procedure: Coronary Stent Intervention;  Surgeon: Ariyan M [name], MD;  Location: [place] [facility];  Service: Cardiovascular;  Laterality: N/A;   CATARACT EXTRACTION W/ INTRAOCULAR LENS  IMPLANT, BILATERAL     CHOLECYSTECTOMY N/A 10/13/2017   Procedure: LAPAROSCOPIC CHOLECYSTECTOMY WITH LYSIS OF ADHESIONS;  Surgeon: Ileana Roup, MD;  Location: [facility];  Service: General;  Laterality: N/A;   COLONOSCOPY     CORONARY ANGIOGRAPHY N/A 11/12/2021   Procedure: CORONARY ANGIOGRAPHY;  Surgeon: Wellington Hampshire, MD;  Location: [place] [facility];  Service: Cardiovascular;  Laterality: N/A;   CORONARY ANGIOGRAPHY N/A 12/24/2021   Procedure: CORONARY ANGIOGRAPHY;  Surgeon: Troy Sine, MD;  Location: [place] [facility];  Service: Cardiovascular;  Laterality: N/A;  CORONARY ANGIOGRAPHY N/A 03/31/2022   Procedure: CORONARY ANGIOGRAPHY;  Surgeon: Jettie Booze, MD;  Location: [place] [facility];  Service: Cardiovascular;  Laterality: N/A;   CORONARY ANGIOGRAPHY N/A 05/07/2022   Procedure: CORONARY ANGIOGRAPHY;  Surgeon: Jettie Booze, MD;  Location: [place] [facility];  Service: Cardiovascular;  Laterality: N/A;   CORONARY BALLOON ANGIOPLASTY N/A 12/24/2021   Procedure: CORONARY BALLOON ANGIOPLASTY;  Surgeon: Troy Sine, MD;  Location: [place] [facility];  Service: Cardiovascular;  Laterality: N/A;   CORONARY BALLOON ANGIOPLASTY N/A 02/09/2022    Procedure: CORONARY BALLOON ANGIOPLASTY;  Surgeon: Nelva Bush, MD;  Location: [place] [facility];  Service: Cardiovascular;  Laterality: N/A;  LAD   CORONARY BALLOON ANGIOPLASTY N/A 03/31/2022   Procedure: CORONARY BALLOON ANGIOPLASTY;  Surgeon: Jettie Booze, MD;  Location: [place] [facility];  Service: Cardiovascular;  Laterality: N/A;   CORONARY BALLOON ANGIOPLASTY N/A 05/07/2022   Procedure: CORONARY BALLOON ANGIOPLASTY;  Surgeon: Jettie Booze, MD;  Location: [place] [facility];  Service: Cardiovascular;  Laterality: N/A;   CORONARY STENT INTERVENTION N/A 11/12/2021   Procedure: CORONARY STENT INTERVENTION;  Surgeon: Wellington Hampshire, MD;  Location: [place] [facility];  Service: Cardiovascular;  Laterality: N/A;  LAD   DENTAL SURGERY     EP IMPLANTABLE DEVICE N/A 09/23/2015   MDT CRT-D, Dr. Caryl Comes   HIATAL HERNIA REPAIR  1977   ILEOCECETOMY N/A 03/27/2017   Procedure: ILEOCECECTOMY;  Surgeon: Ileana Roup, MD;  Location: [place];  Service: General;  Laterality: N/A;   INSERT / REPLACE / REMOVE PACEMAKER  07/2008   Complete heart block status post DDD with good function   INTRAVASCULAR IMAGING/OCT N/A 12/24/2021   Procedure: INTRAVASCULAR IMAGING/OCT;  Surgeon: Troy Sine, MD;  Location: [place] [facility];  Service: Cardiovascular;  Laterality: N/A;   INTRAVASCULAR LITHOTRIPSY  02/09/2022   Procedure: INTRAVASCULAR LITHOTRIPSY;  Surgeon: Nelva Bush, MD;  Location: [place] [facility];  Service: Cardiovascular;;  LAD   INTRAVASCULAR ULTRASOUND/IVUS N/A 02/09/2022   Procedure: Intravascular Ultrasound/IVUS;  Surgeon: Nelva Bush, MD;  Location: [place] [facility];  Service: Cardiovascular;  Laterality: N/A;  LAD   IR FLUORO GUIDE CV LINE RIGHT  04/03/2019   IR US GUIDE VASC ACCESS RIGHT  04/03/2019   LAPAROTOMY N/A 03/27/2017   Procedure: EXPLORATORY LAPAROTOMY;  Surgeon: Ileana Roup, MD;  Location: [place];  Service: General;  Laterality: N/A;    RIGHT HEART CATH AND CORONARY ANGIOGRAPHY N/A 02/09/2022   Procedure: RIGHT HEART CATH AND CORONARY ANGIOGRAPHY;  Surgeon: Nelva Bush, MD;  Location: [place] [facility];  Service: Cardiovascular;  Laterality: N/A;   TONSILLECTOMY     UPPER GI ENDOSCOPY     Family History  Family History  Problem Relation Age of Onset   Stroke Mother    Leukemia Father    Stroke Sister    Heart attack Brother    Social History  reports that he quit smoking about 14 years ago. His smoking use included cigarettes. He has a 81.00 pack-year smoking history. He has never been exposed to tobacco smoke. He has never used smokeless tobacco. He reports current alcohol use. He reports that he does not use drugs. Allergies  Allergies  Allergen Reactions   Bee Venom Anaphylaxis   Lyrica [Pregabalin] Other (See Comments)    hallucinations   Prednisone Other (See Comments)    hallucinations   Zocor [Simvastatin] Nausea Only and Other (See Comments)  Headache with brand name only.  Can take the generic.   Home medications Prior to Admission medications   Medication Sig Start Date End Date Taking? Authorizing Provider  acetaminophen (TYLENOL) 500 MG tablet Take 500 mg by mouth as needed for moderate pain.   Yes [provider]  aspirin EC 81 MG tablet Take 81 mg by mouth daily.   Yes [provider]  atorvastatin (LIPITOR) 80 MG tablet Take 1 tablet by mouth daily. Patient taking differently: Take 80 mg by mouth at bedtime. 11/14/21  Yes Margie Billet, PA-C  B Complex-C-Folic Acid (NEPHRO VITAMINS) 0.8 MG TABS Take 1 tablet by mouth daily.   Yes [provider]  carbamide peroxide (DEBROX) 6.5 % OTIC solution Place 5 drops into both ears 2 (two) times daily as needed (earwax). Patient taking differently: Place 5 drops into both ears as needed (earwax). 11/13/21  Yes Lelon Perla, MD  Carboxymethylcellul-Glycerin (LUBRICATING EYE DROPS OP) Place 2-3 drops into both eyes in  the morning, at noon, and at bedtime.   Yes [provider]  carvedilol (COREG) 3.125 MG tablet Take 1 tablet (3.125 mg total) by mouth 2 (two) times daily with a meal. Hold for SBP <100 mmhg OR hr < 60/min Patient taking differently: Take 3.125 mg by mouth See admin instructions. Take 3.125 twice daily on Tuesday, Thursday, Saturday, and Sunday. Take 3.125 mg in the evening on Monday, Wednesday, Friday. 05/08/22 06/07/22 Yes Kc, Maren Beach, MD  Evolocumab (REPATHA SURECLICK) 045 MG/ML SOAJ Inject 140 mg into the skin every 14 (fourteen) days. 04/22/22  Yes [name], Grey M, MD  ezetimibe (ZETIA) 10 MG tablet Take 1 tablet (10 mg total) by mouth daily. 05/08/22  Yes Antonieta Pert, MD  gabapentin (NEURONTIN) 100 MG capsule Take 240m twice daily for 1 week Then 100 mg twice daily for a week Then 100 mg before bed for a week Then stop Patient taking differently: Take 100 mg by mouth See admin instructions. 100 mg twice daily for a week Then 100 mg before bed for a week. Then stop 05/19/22  Yes HMarin Olp MD  isosorbide mononitrate (IMDUR) 30 MG 24 hr tablet Take 1/2 tablet (15 mg total) by mouth daily. 05/09/22 06/08/22 Yes KAntonieta Pert MD  midodrine (PROAMATINE) 10 MG tablet Take 0.5-1 tablets (5-10 mg total) by mouth See admin instructions. Take 1 tablet (10 mg) three times a week. Add 5 mg a night after dialysis as needed for low blood pressure. Patient taking differently: Take 5-10 mg by mouth See admin instructions. Take 1 tablet (10 mg) three times a week on dialysis days. May take 5 mg a night after dialysis as needed for low blood pressure. 04/02/22  Yes AAline August MD  multivitamin (RENA-VIT) TABS tablet Take 1 tablet by mouth daily. 08/22/21  Yes HMarin Olp MD  nitroGLYCERIN (NITROSTAT) 0.4 MG SL tablet Place 1 tablet under the tongue every 5 minutes x 3 doses as needed for chest pain. 11/13/21  Yes JMargie Billet PA-C  oxymetazoline (AFRIN) 0.05 % nasal spray Place 1 spray  into both nostrils 2 (two) times daily as needed for congestion. Patient taking differently: Place 3-4 sprays into both nostrils as needed (nose bleeds). 12/29/21  Yes HMarin Olp MD  pantoprazole (PROTONIX) 40 MG tablet Take 1 tablet by mouth daily. 11/14/21  Yes JMargie Billet PA-C  saccharomyces boulardii (FLORASTOR) 250 MG capsule Take 1 capsule (250 mg total) by mouth 2 (two) times daily. Patient  taking differently: Take 250 mg by mouth every evening. 04/08/19  Yes Regalado, Belkys A, MD  sevelamer carbonate (RENVELA) 800 MG tablet Take 2 tablets (1,600 mg total) by mouth 3 (three) times daily with meals. Patient taking differently: Take 800-2,400 mg by mouth See admin instructions. Take 2400 mg  by mouth twice a day. Take [phone] mg by mouth with snacks. 11/13/21  Yes Lelon Perla, MD  ticagrelor (BRILINTA) 90 MG TABS tablet Take 1 tablet (90 mg total) by mouth 2 (two) times daily. 05/08/22  Yes Antonieta Pert, MD  vitamin B-12 (CYANOCOBALAMIN) 1000 MCG tablet Take 1,000 mcg by mouth daily.   Yes [provider]  calcitRIOL (ROCALTROL) 0.25 MCG capsule Take 7 capsules (1.75 mcg total) by mouth every Monday, Wednesday, and Friday with hemodialysis. Patient taking differently: Take by mouth every Monday, Wednesday, and Friday with hemodialysis. 01/26/22   [name], Sharief M, MD  glucose blood (FREESTYLE TEST STRIPS) test strip Use to check blood sugar daily 05/01/19   [name], MD  glucose monitoring kit (FREESTYLE) monitoring kit USE TO MONITOR BLOOD GLUCOSE AS DIRECTED 05/01/19   [name], MD  Methoxy PEG-Epoetin Beta (MIRCERA IJ) as directed. 02/26/21 07/15/22  [provider]     Vitals:   05/29/22 1257 05/29/22 1300 05/29/22 1315 05/29/22 1330  BP:  (!) 141/87 (!) 126/54 (!) 107/41  Pulse:  82 80 80  Resp:  15 15 (!) 24  Temp: 98.7 F (37.1 C)     TempSrc: Oral     SpO2:  93% 95% 93%  Weight:      Height:       Exam Gen alert, no  distress No rash, cyanosis or gangrene Sclera anicteric, throat clear  No jvd or bruits Chest clear bilat to bases, no rales/ wheezing RRR no MRG Abd soft ntnd no mass or ascites +bs GU normal male MS no joint effusions or deformity Ext no LE or UE edema, no wounds or ulcers Neuro is alert, Ox 3 , nf    RUA AVG+bruit    Home meds include - aspirin, lipitor, coreg 3.125, repatha, zetia, neurontin, imdur 15 qd, midodrine 10 pre hd mwf, renavite, protonix, renvela 3 ac tid, brilinta, vits/ supps/ prns     OP HD: MWF [facility]  4h   400/500  94kg  2/2 bath  Hep 2500+ [id number]  RUA AVG - last HD 11/15, post 95.4kg - calcitriol 1.75 ug po tiw - mircera 75 ug q4, last 11/6, due 12/04  Assessment/ Plan: L ankle fracture - to OR next week ESRD - on HD MWF. Due for HD today/ tonight, possibly will get postponed to tomorrow.  BP/volume - chronic low BP's. Gets midodrine pre HD tiw. Does not appear fluid overloaded. 3kg up by wts.  HFrEF - EF 45-50% CAD - hx of PCI's CHB sp PPM Anemia esrd - Hb 10-12 here, no esa needs.  MBD ckd - CCa and phos are in range. Cont po vdra.  DM2 - on insulin, per pmd      Rob SJonnie Finner MD 05/29/2022, 2:13 PM Recent Labs  Lab 05/25/22 0000 05/28/22 2357  HGB 10.4* 11.7*  ALBUMIN 3.9  --   CALCIUM  --  9.3  PHOS  --  4.3  CREATININE  --  6.90*  K 5.7* 5.0   Inpatient medications:  acidophilus  1 capsule Oral Daily   aspirin EC  81 mg Oral Daily   atorvastatin  80 mg Oral  QHS   [START ON 06/01/2022] calcitRIOL  1.75 mcg Oral Q M,W,F-HD   [START ON 05/30/2022] carvedilol  3.125 mg Oral 2 times per day on Sun Tue Thu Sat   carvedilol  3.125 mg Oral Once per day on Mon Wed Fri   cyanocobalamin  1,000 mcg Oral Daily   ezetimibe  10 mg Oral Daily   gabapentin  100 mg Oral BID   Followed by   Derrill Memo ON 06/05/2022] gabapentin  100 mg Oral QHS   heparin  5,000 Units Subcutaneous Q8H   isosorbide mononitrate  15 mg Oral Daily   melatonin  3 mg Oral  QHS   midodrine  10 mg Oral Q M,W,F   multivitamin  1 tablet Oral Daily   pantoprazole  40 mg Oral Daily   senna  1 tablet Oral QHS   sevelamer carbonate  1,600 mg Oral With snacks   sevelamer carbonate  2,400 mg Oral BID WC   ticagrelor  90 mg Oral BID    HYDROmorphone (DILAUDID) injection, methocarbamol, oxyCODONE-acetaminophen, oxymetazoline, polyvinyl alcohol

## 2022-05-29 NOTE — ED Notes (Signed)
Ortho has been notified of short leg splint

## 2022-05-29 NOTE — Progress Notes (Signed)
NEW ADMISSION NOTE New Admission Note:   Arrival Method: ED Stretcher Mental Orientation: AAOx4 Telemetry:5M10 Assessment: Completed Skin: See assessment IV: Left hand Pain: 7/10 left foot Tubes: n/a Safety Measures: Safety Fall Prevention Plan has been given, discussed and signed Admission: Completed [facility] Orientation: Patient has been orientated to the room, unit and staff.  Family: none at bedside  Orders have been reviewed and implemented. Will continue to monitor the patient. Call light has been placed within reach and bed alarm has been activated.   Vira Agar, RN

## 2022-05-29 NOTE — ED Provider Notes (Addendum)
Orthopedics consulted re ankle fx/subluxation - Ambrose Finland will see, feels likely will need OR for fixation/stabilization followed by PT, etc., and recommends admission to medicine.   Given complex medical hx/needs, ESRD/HD, etc, will consult medicine for admission.    Nephrology consulted to facilitate dialysis.        Lajean Saver, MD 05/29/22 [phone]

## 2022-05-29 NOTE — ED Notes (Signed)
Left message for pt daughter in law (linda) per pt request, rec'd voicemail, left number for call back.

## 2022-05-29 NOTE — Consult Note (Signed)
Reason for Consult:Left ankle fx Referring Physician: Lajean Saver Time called: [id number] Time at bedside: 0824   Rodney Farley is an 88 y.o. male.  HPI: Gaylon slipped on some papers in his apartment and fell to the floor. He didn't have pain initially but did note that his ankle was quite deformed. He called EMS and was brought to the ED where x-rays showed an ankle fx. He underwent reduction by ED staff. The initial plan was to return home with NWB LLE but was unable to do this safely and will be admitted instead. He lives alone and ambulates with either a cane or RW at baseline.  Past Medical History:  Diagnosis Date   AICD (automatic cardioverter/defibrillator) present    [employer] pacer   Anemia    Anxiety and depression    Arthritis    Bell's palsy    left side. after shingles episode   BPH associated with nocturia    Chronic systolic CHF (congestive heart failure) ([place])    EF normalized by Echo 2019   COPD (chronic obstructive pulmonary disease) ([place])    Severe   Coronary artery disease    a. s/p MI in 1994/1995 while in [place] s/p questionable PCI. 03/2015: progression of disease, for staged PCI.   Diabetic peripheral neuropathy (HCC)    GERD (gastroesophageal reflux disease)    Gout    Hard of hearing    B/L   History of chronic pancreatitis 07/23/2017   noted on CT abd/pelvis   History of shingles    Hypercholesterolemia    Hypertension    Obesity    Sleep apnea    "sleeps w/humidifyer when he panics and gets short of breath" (04/08/2015)   TIA (transient ischemic attack) X 3   Trigger middle finger of left hand    Type II diabetes mellitus ([place])    Wears glasses    Wears hearing aid     Past Surgical History:  Procedure Laterality Date   APPENDECTOMY     AV FISTULA PLACEMENT Right 02/07/2019   Procedure: ARTERIOVENOUS (AV) FISTULA CREATION RIGHT UPPER ARM;  Surgeon: Waynetta Sandy, MD;  Location: [facility];  Service: Vascular;  Laterality: Right;    BIV ICD GENERATOR CHANGEOUT N/A 10/09/2021   Procedure: BIV ICD GENERATOR CHANGEOUT;  Surgeon: Deboraha Sprang, MD;  Location: [facility];  Service: Cardiovascular;  Laterality: N/A;   CARDIAC CATHETERIZATION N/A 03/29/2015   Procedure: Right/Left Heart Cath and Coronary Angiography;  Surgeon: Aaryav M [name], MD;  Location: [facility];  Service: Cardiovascular;  Laterality: N/A;   [place]   "after my MI; put me on heart RX after cath"   CARDIAC CATHETERIZATION N/A 04/09/2015   Procedure: Coronary Stent Intervention;  Surgeon: Jaydis M [name], MD;  Location: [facility];  Service: Cardiovascular;  Laterality: N/A;   CATARACT EXTRACTION W/ INTRAOCULAR LENS  IMPLANT, BILATERAL     CHOLECYSTECTOMY N/A 10/13/2017   Procedure: LAPAROSCOPIC CHOLECYSTECTOMY WITH LYSIS OF ADHESIONS;  Surgeon: Ileana Roup, MD;  Location: [facility];  Service: General;  Laterality: N/A;   COLONOSCOPY     CORONARY ANGIOGRAPHY N/A 11/12/2021   Procedure: CORONARY ANGIOGRAPHY;  Surgeon: Wellington Hampshire, MD;  Location: [facility];  Service: Cardiovascular;  Laterality: N/A;   CORONARY ANGIOGRAPHY N/A 12/24/2021   Procedure: CORONARY ANGIOGRAPHY;  Surgeon: Troy Sine, MD;  Location: [facility];  Service: Cardiovascular;  Laterality: N/A;   CORONARY  ANGIOGRAPHY N/A 03/31/2022   Procedure: CORONARY ANGIOGRAPHY;  Surgeon: Jettie Booze, MD;  Location: [facility];  Service: Cardiovascular;  Laterality: N/A;   CORONARY ANGIOGRAPHY N/A 05/07/2022   Procedure: CORONARY ANGIOGRAPHY;  Surgeon: Jettie Booze, MD;  Location: [facility];  Service: Cardiovascular;  Laterality: N/A;   CORONARY BALLOON ANGIOPLASTY N/A 12/24/2021   Procedure: CORONARY BALLOON ANGIOPLASTY;  Surgeon: Troy Sine, MD;  Location: [facility];  Service: Cardiovascular;  Laterality: N/A;   CORONARY BALLOON ANGIOPLASTY N/A 02/09/2022   Procedure: CORONARY BALLOON  ANGIOPLASTY;  Surgeon: Nelva Bush, MD;  Location: [facility];  Service: Cardiovascular;  Laterality: N/A;  LAD   CORONARY BALLOON ANGIOPLASTY N/A 03/31/2022   Procedure: CORONARY BALLOON ANGIOPLASTY;  Surgeon: Jettie Booze, MD;  Location: [facility];  Service: Cardiovascular;  Laterality: N/A;   CORONARY BALLOON ANGIOPLASTY N/A 05/07/2022   Procedure: CORONARY BALLOON ANGIOPLASTY;  Surgeon: Jettie Booze, MD;  Location: [facility];  Service: Cardiovascular;  Laterality: N/A;   CORONARY STENT INTERVENTION N/A 11/12/2021   Procedure: CORONARY STENT INTERVENTION;  Surgeon: Wellington Hampshire, MD;  Location: [facility];  Service: Cardiovascular;  Laterality: N/A;  LAD   DENTAL SURGERY     EP IMPLANTABLE DEVICE N/A 09/23/2015   MDT CRT-D, Dr. Caryl Comes   HIATAL HERNIA REPAIR  1977   ILEOCECETOMY N/A 03/27/2017   Procedure: ILEOCECECTOMY;  Surgeon: Ileana Roup, MD;  Location: [facility];  Service: General;  Laterality: N/A;   INSERT / REPLACE / REMOVE PACEMAKER  07/2008   Complete heart block status post DDD with good function   INTRAVASCULAR IMAGING/OCT N/A 12/24/2021   Procedure: INTRAVASCULAR IMAGING/OCT;  Surgeon: Troy Sine, MD;  Location: [facility];  Service: Cardiovascular;  Laterality: N/A;   INTRAVASCULAR LITHOTRIPSY  02/09/2022   Procedure: INTRAVASCULAR LITHOTRIPSY;  Surgeon: Nelva Bush, MD;  Location: [facility];  Service: Cardiovascular;;  LAD   INTRAVASCULAR ULTRASOUND/IVUS N/A 02/09/2022   Procedure: Intravascular Ultrasound/IVUS;  Surgeon: Nelva Bush, MD;  Location: [facility];  Service: Cardiovascular;  Laterality: N/A;  LAD   IR FLUORO GUIDE CV LINE RIGHT  04/03/2019   IR US GUIDE VASC ACCESS RIGHT  04/03/2019   LAPAROTOMY N/A 03/27/2017   Procedure: EXPLORATORY LAPAROTOMY;  Surgeon: Ileana Roup, MD;  Location: [facility];  Service: General;  Laterality: N/A;   RIGHT HEART CATH AND CORONARY  ANGIOGRAPHY N/A 02/09/2022   Procedure: RIGHT HEART CATH AND CORONARY ANGIOGRAPHY;  Surgeon: Nelva Bush, MD;  Location: [facility];  Service: Cardiovascular;  Laterality: N/A;   TONSILLECTOMY     UPPER GI ENDOSCOPY      Family History  Problem Relation Age of Onset   Stroke Mother    Leukemia Father    Stroke Sister    Heart attack Brother     Social History:  reports that he quit smoking about 14 years ago. His smoking use included cigarettes. He has a 81.00 pack-year smoking history. He has never been exposed to tobacco smoke. He has never used smokeless tobacco. He reports current alcohol use. He reports that he does not use drugs.  Allergies:  Allergies  Allergen Reactions   Bee Venom Anaphylaxis   Lyrica [Pregabalin] Other (See Comments)    hallucinations   Prednisone Other (See Comments)    hallucinations   Zocor [Simvastatin] Nausea Only and Other (See Comments)    Headache  with brand name only.  Can take the generic.    Medications: I have reviewed the patient's current medications.  Results for orders placed or performed during the hospital encounter of 05/28/22 (from the past 48 hour(s))  CBC with Differential     Status: Abnormal   Collection Time: 05/28/22 11:57 PM  Result Value Ref Range   WBC 5.0 4.0 - 10.5 K/uL   RBC 3.36 (L) 4.22 - 5.81 MIL/uL   Hemoglobin 11.7 (L) 13.0 - 17.0 g/dL   HCT 35.7 (L) 39.0 - 52.0 %   MCV 106.3 (H) 80.0 - 100.0 fL   MCH 34.8 (H) 26.0 - 34.0 pg   MCHC 32.8 30.0 - 36.0 g/dL   RDW 15.0 11.5 - 15.5 %   Platelets 120 (L) 150 - 400 K/uL   nRBC 0.0 0.0 - 0.2 %   Neutrophils Relative % 57 %   Neutro Abs 2.8 1.7 - 7.7 K/uL   Lymphocytes Relative 22 %   Lymphs Abs 1.1 0.7 - 4.0 K/uL   Monocytes Relative 14 %   Monocytes Absolute 0.7 0.1 - 1.0 K/uL   Eosinophils Relative 6 %   Eosinophils Absolute 0.3 0.0 - 0.5 K/uL   Basophils Relative 1 %   Basophils Absolute 0.0 0.0 - 0.1 K/uL   Immature Granulocytes 0 %   Abs  Immature Granulocytes 0.02 0.00 - 0.07 K/uL    Comment: Performed at [facility], [name] [place], [place] [address]  Basic metabolic panel     Status: Abnormal   Collection Time: 05/28/22 11:57 PM  Result Value Ref Range   Sodium 139 135 - 145 mmol/L   Potassium 5.0 3.5 - 5.1 mmol/L   Chloride 96 (L) 98 - 111 mmol/L   CO2 27 22 - 32 mmol/L   Glucose, Bld 124 (H) 70 - 99 mg/dL    Comment: Glucose reference range applies only to samples taken after fasting for at least 8 hours.   BUN 23 8 - 23 mg/dL   Creatinine, Ser 6.90 (H) 0.61 - 1.24 mg/dL   Calcium 9.3 8.9 - 10.3 mg/dL   GFR, Estimated 7 (L) >60 mL/min    Comment: (NOTE) Calculated using the CKD-EPI Creatinine Equation (2021)    Anion gap 16 (H) 5 - 15    Comment: Performed at [facility] [address] [place], [place] [address]  Phosphorus     Status: None   Collection Time: 05/28/22 11:57 PM  Result Value Ref Range   Phosphorus 4.3 2.5 - 4.6 mg/dL    Comment: Performed at [facility], [place] [address], [place], [place] [address]   *Note: Due to a large number of results and/or encounters for the requested time period, some results have not been displayed. A complete set of results can be found in Results Review.    DG Ankle Complete Left  Result Date: 05/29/2022 CLINICAL DATA:  Left ankle fracture subluxation, post reduction imaging EXAM: LEFT ANKLE COMPLETE - 3+ VIEW COMPARISON:  05/28/2022 FINDINGS: Interval closed reduction of left ankle fracture subluxation. Distal left fibular diaphyseal fracture demonstrates 1/2 shaft with lateral displacement and mild posterior angulation. Medial joint spacel widening is improved with residual 1 cm lateral subluxation of the talar dome in relation to the tibial plafond. External immobilizer has been placed. Bimalleolar soft tissue swelling again noted. IMPRESSION: 1. Interval closed reduction of left ankle fracture subluxation. Residual lateral subluxation of  the talar dome in relation  to the tibial plafond. Electronically Signed   By: Fidela Salisbury M.D.   On: 05/29/2022 01:55   DG Ankle Complete Left  Result Date: 05/29/2022 CLINICAL DATA:  Fall, left ankle pain EXAM: LEFT ANKLE COMPLETE - 3+ VIEW COMPARISON:  None Available. FINDINGS: Acute fracture subluxation of the left ankle noted. There is an oblique fracture of the distal left fibular diaphysis 3 cm proximal to the tibial plafond and with 1 shaft with lateral displacement and moderate posterior angulation of the distal fracture fragment. There is 2 cm lateral and 1.5 cm posterior subluxation of the talar dome in relation to the tibial plafond with marked widening of the medial joint space. Multiple ossific densities seen within the medial joint space appear corticated likely reflect the sequela of remote trauma or inflammation. There is moderate bimalleolar soft tissue swelling. Vascular calcifications are noted. IMPRESSION: 1. Acute fracture subluxation of the left ankle as described above. 2. Moderate bimalleolar soft tissue swelling. Electronically Signed   By: Fidela Salisbury M.D.   On: 05/29/2022 00:08    Review of Systems  HENT:  Negative for ear discharge, ear pain, hearing loss and tinnitus.   Eyes:  Negative for photophobia and pain.  Respiratory:  Negative for cough and shortness of breath.   Cardiovascular:  Negative for chest pain.  Gastrointestinal:  Negative for abdominal pain, nausea and vomiting.  Genitourinary:  Negative for dysuria, flank pain, frequency and urgency.  Musculoskeletal:  Positive for arthralgias (Left ankle). Negative for back pain, myalgias and neck pain.  Neurological:  Negative for dizziness and headaches.  Hematological:  Does not bruise/bleed easily.  Psychiatric/Behavioral:  The patient is not nervous/anxious.    Blood pressure (!) 104/42, pulse 80, temperature 98.3 F (36.8 C), temperature source Oral, resp. rate 16, height 5\' 7"  (1.702 m), weight 96.6  kg, SpO2 94 %. Physical Exam Constitutional:      General: He is not in acute distress.    Appearance: He is well-developed. He is not diaphoretic.  HENT:     Head: Normocephalic and atraumatic.  Eyes:     General: No scleral icterus.       Right eye: No discharge.        Left eye: No discharge.     Conjunctiva/sclera: Conjunctivae normal.  Cardiovascular:     Rate and Rhythm: Normal rate and regular rhythm.  Pulmonary:     Effort: Pulmonary effort is normal. No respiratory distress.  Musculoskeletal:     Cervical back: Normal range of motion.     Comments: LLE No traumatic wounds, ecchymosis, or rash  Short leg splint in place  No knee effusion  Knee stable to varus/ valgus and anterior/posterior stress  Sens DPN, SPN, TN paresthetic (chronic)  Motor EHL 5/5  Toes perfused, No significant edema  Skin:    General: Skin is warm and dry.  Neurological:     Mental Status: He is alert.  Psychiatric:        Mood and Affect: Mood normal.        Behavior: Behavior normal.     Assessment/Plan: Left ankle fx -- Will need ORIF, likely sometime next week. Ok for discharge home if able prior to that and we can plan surgery electively. NWB LLE. Multiple medical problems including DM, ESRD on HD, and CAD -- per primary service. Will probably need cardiac clearance prior to surgery.    Lisette Abu, PA-C Orthopedic Surgery [phone] 05/29/2022, 9:42 AM

## 2022-05-29 NOTE — ED Notes (Signed)
Spoke with pt daughter in law, she states she does not feel the pt would be safe to return home alone, nor can he go to their home d/t multiple steps. Concerns relayed to EDP. Pt and family aware of plans at this time.

## 2022-05-29 NOTE — ED Notes (Signed)
Provided patient with coffee.

## 2022-05-29 NOTE — Progress Notes (Signed)
Orthopedic Tech Progress Note Patient Details:  Rodney Farley 1932-05-05 [id number]  Ortho Devices Type of Ortho Device: Short leg splint, Stirrup splint Ortho Device/Splint Location: LLE Ortho Device/Splint Interventions: Ordered, Application, Adjustment  Assisted ED MD with splint. Post Interventions Patient Tolerated: Daron Offer 05/29/2022, 1:14 AM

## 2022-05-29 NOTE — ED Notes (Signed)
Patient was initially going to go to surgery and now plan changed and patient will go to surgery on Monday.

## 2022-05-29 NOTE — Plan of Care (Signed)
CHANGE IN DIALYSIS SCHEDULE DUE TO HOLIDAY  Rodney Farley 08-02-1931 [id number]  Patient dialysis schedule for the week of 05/31/22-06/07/22 varies from their normal schedule due to Thanksgiving Holiday.  Need to keep this in mind for discharge planning.  The Holiday schedule is as follow:   Normal HD Schedule: Monday Wednesday Friday Thanksgiving schedule:Sunday, Tuesday, Friday  They will resume their normal schedule on 06/08/22.     Jen Mow, PA-C [facility] Pager: [phone]

## 2022-05-29 NOTE — Progress Notes (Signed)
PT Cancellation Note  Patient Details Name: Rodney Farley MRN: [id number] DOB: 11-28-1931   Cancelled Treatment:    Reason Eval/Treat Not Completed: Medical issues which prohibited therapy (Pt going to OR today per nurse.  Defer evaluation today.  Will check back tomorrow.)   Alvira Philips 05/29/2022, 11:47 AM Vikas Wegmann M,PT Acute Rehab Services [phone]

## 2022-05-29 NOTE — Consult Note (Signed)
Cardiology Consultation   Patient ID: Rodney Farley MRN: [id number]; DOB: [birth year]  Admit date: 05/28/2022 Date of Consult: 05/29/2022  PCP:  Rodney Farley, [facility] Cardiologist:  Rodney [name], MD   Patient Profile:   Rodney Farley is a 88 y.o. male with a history of CAD with remote MI in 1994 while in [place], PCIs to LAD/ 2nd Diag/ OM3 in 2016, NSTEMI in 11/2021 s/p DES to ostial to LAD s/p multiple repeat PCIs with balloon angioplasty for in-stent restenosis of ostial LAD in 12/2021, 01/2022, 03/2022, and most recently on 05/07/2022. Also has a history of ischemic cardiomyopathy/ chronic systolic CHF with EF as low as 25-30% in 2016 with subsequent normalization and then EF of 45-50% on most recent Echo on 11/11/2021; complete heart block s/p PPM which was upgraded to ICD-CRT in 2017 (gen change in 09/2021); subclinical atrial fibrillation noted on device in 2021 not on anticoagulation, moderate aortic stenosis, hypertension; hyperlipidemia; type 2 diabetes mellitus; ESRD on hemodialysis; obstructive sleep apnea; COPD; TIA; and BPH who is being seen 05/29/2022 for pre-op evaluation at the request of Dr. Tamala Julian.  History of Present Illness:   Rodney Farley is 88 year old with a complex history as above who is followed by Dr. [name]. He has a remote history of of MI in 89 while in [place] and underwent PCI to the LAD, 2nd Diag, and OM3 in 2016. He was admitted in 11/2021 with a NSTEMI. Echo showed LVEF of 45-50% with akinesis of the apical septal segment, apical inferior segment, and apex as well as grade 1 diastolic dysfunction and moderate aortic stenosis. LHC on 11/12/2021 90% stenosis of ostial to proximal LAD, 90% stenosis of ostial 1st Diag, and 70% stenosis of ostial to proximal RCA, and then mild disease in OM3, 2nd Diag, and LPAV. He underwent successful PCI with DES to ostial to proximal LAD lesion. He has had multiple admission since then  for NSTEMI due to in-stent restenosis of LAD.  He admitted in 12/2021 with NSTEMI . Repeat LHC showed 90% in-stent restenosis of distal left main to ostial LAD with thrombus and probable stent underexpansion. Patient underwent difficult but successful PCI with PCTA of this lesion. He was admitted in 01/2022 with NSTEMI. LHC again showed in-stent restenosis of proximal LAD stent. Intravascular imaging showed stent was not fully expanded due to calcification. He underwent intracoronary lithotripsy and aggressive dilatation with a 4.0 balloon. Additional stenting was not performed due to inability to expand fully. He was then readmitted in 03/2022 for recurrent chest pain. Troponin was minimally elevated but not consistent with ACS. LHC showed 90% distal left main and ostial LAD in-stent restenosis which was treated with balloon angioplasty. Hospital course was complicated by possible seizure like activity. Patient underwent EEG which showed no seizure or epileptic discharges. Most recently patient was admitted from 05/06/2022 to 05/08/2022 again for NSTEMI. LHC showed 90% in-stent restenosis of distal left main to ostial LAD,  70% stenosis of ostial to proximal RCA (small vessel), 30% stenosis of proximal LCX followed by 30% stenosis of mid LCX and 40% stenosis of mid to distal LCX, 30% stenosis of 2nd Diag, and 50% stenosis of OM3. He again underwent PCI with successful balloon angioplasty with score flex balloon at high pressure followed by [place] balloon at high pressure. Echo during that admission showed LVEF of 45-50% with hypokinesis of the apical septal segment, apical inferior segment, and apex as well moderate aortic stenosis. Patient  was recently seen by Rodney Deforest, PA-C, for follow-up on 05/19/2022 at which time he was doing well. Rodney Farley spoke with Dr. [name] about what other options we had if he gets readmitted for another in-stent restenosis. Dr. [name] did not feel like brachytherapy would provide him much benefit  and daughter did not think patient would be able to travel to a tertiary center for this either. Therefore, the remaining two options are to consider a drug-coated balloon angioplasty or palliative care.  Patient presented to the ED on 05/28/2022 via EMS after a fall and was found to have a left ankle fracture. Ortho has been consulted and plan is for surgery on 06/02/2022. Cardiology consulted for pre-op evaluation.  Patient has done well since his last balloon angioplasty with no recurrent chest pain. He has chronic dyspnea on exertion which he attributes mostly to his COPD. He states he can walk about 40 paces on a flat surface before he gets short of breath and has to use his inhaler. If he starts walking up an incline, this is much shorter. He states he has to uses his inhaler about 3 times per day. He denies any shortness of breath at rest. No orthopnea, PND, or lower extremity edema. She denies any palpitations. She has some lightheadedness/dizziness when her BP drops during dialysis. He takes Midodrine on these days but has not required any Midodrine on non dialysis days. We discusses events around his falls. It was clearly a mechanical falls. He slipped on a piece of paper that was on the floor. He slid to the floor along his recliner so thankfully it was a gentle fall and he did not hit his head. He denies any abnormal bleeding on DAPT.  Past Medical History:  Diagnosis Date   AICD (automatic cardioverter/defibrillator) present    [employer] pacer   Anemia    Anxiety and depression    Arthritis    Bell's palsy    left side. after shingles episode   BPH associated with nocturia    Chronic systolic CHF (congestive heart failure) ([place])    EF normalized by Echo 2019   COPD (chronic obstructive pulmonary disease) ([place])    Severe   Coronary artery disease    a. s/p MI in 1994/1995 while in [place] s/p questionable PCI. 03/2015: progression of disease, for staged PCI.   Diabetic peripheral  neuropathy (HCC)    GERD (gastroesophageal reflux disease)    Gout    Hard of hearing    B/L   History of chronic pancreatitis 07/23/2017   noted on CT abd/pelvis   History of shingles    Hypercholesterolemia    Hypertension    Obesity    Sleep apnea    "sleeps w/humidifyer when he panics and gets short of breath" (04/08/2015)   TIA (transient ischemic attack) X 3   Trigger middle finger of left hand    Type II diabetes mellitus ([place])    Wears glasses    Wears hearing aid     Past Surgical History:  Procedure Laterality Date   APPENDECTOMY     AV FISTULA PLACEMENT Right 02/07/2019   Procedure: ARTERIOVENOUS (AV) FISTULA CREATION RIGHT UPPER ARM;  Surgeon: Waynetta Sandy, MD;  Location: [facility];  Service: Vascular;  Laterality: Right;   BIV ICD GENERATOR CHANGEOUT N/A 10/09/2021   Procedure: BIV ICD GENERATOR CHANGEOUT;  Surgeon: Deboraha Sprang, MD;  Location: [facility];  Service: Cardiovascular;  Laterality: N/A;   CARDIAC CATHETERIZATION N/A  03/29/2015   Procedure: Right/Left Heart Cath and Coronary Angiography;  Surgeon: Augustine M [name], MD;  Location: [facility];  Service: Cardiovascular;  Laterality: N/A;   [place]   "after my MI; put me on heart RX after cath"   CARDIAC CATHETERIZATION N/A 04/09/2015   Procedure: Coronary Stent Intervention;  Surgeon: Hever M [name], MD;  Location: [facility];  Service: Cardiovascular;  Laterality: N/A;   CATARACT EXTRACTION W/ INTRAOCULAR LENS  IMPLANT, BILATERAL     CHOLECYSTECTOMY N/A 10/13/2017   Procedure: LAPAROSCOPIC CHOLECYSTECTOMY WITH LYSIS OF ADHESIONS;  Surgeon: Ileana Roup, MD;  Location: [facility];  Service: General;  Laterality: N/A;   COLONOSCOPY     CORONARY ANGIOGRAPHY N/A 11/12/2021   Procedure: CORONARY ANGIOGRAPHY;  Surgeon: Wellington Hampshire, MD;  Location: [facility];  Service: Cardiovascular;  Laterality: N/A;   CORONARY ANGIOGRAPHY N/A 12/24/2021    Procedure: CORONARY ANGIOGRAPHY;  Surgeon: Troy Sine, MD;  Location: [facility];  Service: Cardiovascular;  Laterality: N/A;   CORONARY ANGIOGRAPHY N/A 03/31/2022   Procedure: CORONARY ANGIOGRAPHY;  Surgeon: Jettie Booze, MD;  Location: [facility];  Service: Cardiovascular;  Laterality: N/A;   CORONARY ANGIOGRAPHY N/A 05/07/2022   Procedure: CORONARY ANGIOGRAPHY;  Surgeon: Jettie Booze, MD;  Location: [facility];  Service: Cardiovascular;  Laterality: N/A;   CORONARY BALLOON ANGIOPLASTY N/A 12/24/2021   Procedure: CORONARY BALLOON ANGIOPLASTY;  Surgeon: Troy Sine, MD;  Location: [facility];  Service: Cardiovascular;  Laterality: N/A;   CORONARY BALLOON ANGIOPLASTY N/A 02/09/2022   Procedure: CORONARY BALLOON ANGIOPLASTY;  Surgeon: Nelva Bush, MD;  Location: [facility];  Service: Cardiovascular;  Laterality: N/A;  LAD   CORONARY BALLOON ANGIOPLASTY N/A 03/31/2022   Procedure: CORONARY BALLOON ANGIOPLASTY;  Surgeon: Jettie Booze, MD;  Location: [facility];  Service: Cardiovascular;  Laterality: N/A;   CORONARY BALLOON ANGIOPLASTY N/A 05/07/2022   Procedure: CORONARY BALLOON ANGIOPLASTY;  Surgeon: Jettie Booze, MD;  Location: [facility];  Service: Cardiovascular;  Laterality: N/A;   CORONARY STENT INTERVENTION N/A 11/12/2021   Procedure: CORONARY STENT INTERVENTION;  Surgeon: Wellington Hampshire, MD;  Location: [facility];  Service: Cardiovascular;  Laterality: N/A;  LAD   DENTAL SURGERY     EP IMPLANTABLE DEVICE N/A 09/23/2015   MDT CRT-D, Dr. Caryl Comes   HIATAL HERNIA REPAIR  1977   ILEOCECETOMY N/A 03/27/2017   Procedure: ILEOCECECTOMY;  Surgeon: Ileana Roup, MD;  Location: [facility];  Service: General;  Laterality: N/A;   INSERT / REPLACE / REMOVE PACEMAKER  07/2008   Complete heart block status post DDD with good function   INTRAVASCULAR IMAGING/OCT N/A 12/24/2021   Procedure:  INTRAVASCULAR IMAGING/OCT;  Surgeon: Troy Sine, MD;  Location: [facility];  Service: Cardiovascular;  Laterality: N/A;   INTRAVASCULAR LITHOTRIPSY  02/09/2022   Procedure: INTRAVASCULAR LITHOTRIPSY;  Surgeon: Nelva Bush, MD;  Location: [facility];  Service: Cardiovascular;;  LAD   INTRAVASCULAR ULTRASOUND/IVUS N/A 02/09/2022   Procedure: Intravascular Ultrasound/IVUS;  Surgeon: Nelva Bush, MD;  Location: [facility];  Service: Cardiovascular;  Laterality: N/A;  LAD   IR FLUORO GUIDE CV LINE RIGHT  04/03/2019   IR US GUIDE VASC ACCESS RIGHT  04/03/2019   LAPAROTOMY N/A 03/27/2017   Procedure: EXPLORATORY LAPAROTOMY;  Surgeon: Ileana Roup, MD;  Location: [facility];  Service: General;  Laterality: N/A;   RIGHT HEART CATH AND CORONARY ANGIOGRAPHY N/A 02/09/2022   Procedure: RIGHT HEART CATH AND CORONARY ANGIOGRAPHY;  Surgeon: Nelva Bush, MD;  Location: [facility];  Service: Cardiovascular;  Laterality: N/A;   TONSILLECTOMY     UPPER GI ENDOSCOPY       Home Medications:  Prior to Admission medications   Medication Sig Start Date End Date Taking? Authorizing Provider  acetaminophen (TYLENOL) 500 MG tablet Take 500 mg by mouth as needed for moderate pain.   Yes [provider]  aspirin EC 81 MG tablet Take 81 mg by mouth daily.   Yes [provider]  atorvastatin (LIPITOR) 80 MG tablet Take 1 tablet by mouth daily. Patient taking differently: Take 80 mg by mouth at bedtime. 11/14/21  Yes Margie Billet, PA-C  B Complex-C-Folic Acid (NEPHRO VITAMINS) 0.8 MG TABS Take 1 tablet by mouth daily.   Yes [provider]  carbamide peroxide (DEBROX) 6.5 % OTIC solution Place 5 drops into both ears 2 (two) times daily as needed (earwax). Patient taking differently: Place 5 drops into both ears as needed (earwax). 11/13/21  Yes Lelon Perla, MD  Carboxymethylcellul-Glycerin (LUBRICATING EYE DROPS OP) Place 2-3 drops into  both eyes in the morning, at noon, and at bedtime.   Yes [provider]  carvedilol (COREG) 3.125 MG tablet Take 1 tablet (3.125 mg total) by mouth 2 (two) times daily with a meal. Hold for SBP <100 mmhg OR hr < 60/min Patient taking differently: Take 3.125 mg by mouth See admin instructions. Take 3.125 twice daily on Tuesday, Thursday, Saturday, and Sunday. Take 3.125 mg in the evening on Monday, Wednesday, Friday. 05/08/22 06/07/22 Yes Kc, Maren Beach, MD  Evolocumab (REPATHA SURECLICK) 035 MG/ML SOAJ Inject 140 mg into the skin every 14 (fourteen) days. 04/22/22  Yes Farley, Blayn M, MD  ezetimibe (ZETIA) 10 MG tablet Take 1 tablet (10 mg total) by mouth daily. 05/08/22  Yes Antonieta Pert, MD  gabapentin (NEURONTIN) 100 MG capsule Take 228m twice daily for 1 week Then 100 mg twice daily for a week Then 100 mg before bed for a week Then stop Patient taking differently: Take 100 mg by mouth See admin instructions. 100 mg twice daily for a week Then 100 mg before bed for a week. Then stop 05/19/22  Yes HMarin Olp MD  isosorbide mononitrate (IMDUR) 30 MG 24 hr tablet Take 1/2 tablet (15 mg total) by mouth daily. 05/09/22 06/08/22 Yes KAntonieta Pert MD  midodrine (PROAMATINE) 10 MG tablet Take 0.5-1 tablets (5-10 mg total) by mouth See admin instructions. Take 1 tablet (10 mg) three times a week. Add 5 mg a night after dialysis as needed for low blood pressure. Patient taking differently: Take 5-10 mg by mouth See admin instructions. Take 1 tablet (10 mg) three times a week on dialysis days. May take 5 mg a night after dialysis as needed for low blood pressure. 04/02/22  Yes AAline August MD  multivitamin (RENA-VIT) TABS tablet Take 1 tablet by mouth daily. 08/22/21  Yes HMarin Olp MD  nitroGLYCERIN (NITROSTAT) 0.4 MG SL tablet Place 1 tablet under the tongue every 5 minutes x 3 doses as needed for chest pain. 11/13/21  Yes JMargie Billet PA-C  oxymetazoline (AFRIN) 0.05 % nasal spray  Place 1 spray into both nostrils 2 (two) times daily as needed for congestion. Patient taking differently: Place 3-4 sprays into both nostrils as needed (nose bleeds). 12/29/21  Yes  Rodney Olp, MD  pantoprazole (PROTONIX) 40 MG tablet Take 1 tablet by mouth daily. 11/14/21  Yes Margie Billet, PA-C  saccharomyces boulardii (FLORASTOR) 250 MG capsule Take 1 capsule (250 mg total) by mouth 2 (two) times daily. Patient taking differently: Take 250 mg by mouth every evening. 04/08/19  Yes Regalado, Belkys A, MD  sevelamer carbonate (RENVELA) 800 MG tablet Take 2 tablets (1,600 mg total) by mouth 3 (three) times daily with meals. Patient taking differently: Take 800-2,400 mg by mouth See admin instructions. Take 2400 mg  by mouth twice a day. Take [phone] mg by mouth with snacks. 11/13/21  Yes Lelon Perla, MD  ticagrelor (BRILINTA) 90 MG TABS tablet Take 1 tablet (90 mg total) by mouth 2 (two) times daily. 05/08/22  Yes Antonieta Pert, MD  vitamin B-12 (CYANOCOBALAMIN) 1000 MCG tablet Take 1,000 mcg by mouth daily.   Yes [provider]  calcitRIOL (ROCALTROL) 0.25 MCG capsule Take 7 capsules (1.75 mcg total) by mouth every Monday, Wednesday, and Friday with hemodialysis. Patient taking differently: Take by mouth every Monday, Wednesday, and Friday with hemodialysis. 01/26/22   Farley, Randell M, MD  glucose blood (FREESTYLE TEST STRIPS) test strip Use to check blood sugar daily 05/01/19   Rodney Olp, MD  glucose monitoring kit (FREESTYLE) monitoring kit USE TO MONITOR BLOOD GLUCOSE AS DIRECTED 05/01/19   Rodney Olp, MD  Methoxy PEG-Epoetin Beta (MIRCERA IJ) as directed. 02/26/21 07/15/22  [provider]    Inpatient Medications: Scheduled Meds:  aspirin EC  81 mg Oral Daily   atorvastatin  80 mg Oral QHS   [START ON 06/01/2022] calcitRIOL  1.75 mcg Oral Q M,W,F-HD   carboxymethylcellul-glycerin  1 drop Both Eyes TID   carvedilol  3.125 mg Oral See admin  instructions   cyanocobalamin  1,000 mcg Oral Daily   ezetimibe  10 mg Oral Daily   gabapentin  100 mg Oral See admin instructions   heparin  5,000 Units Subcutaneous Q8H   isosorbide mononitrate  15 mg Oral Daily   melatonin  3 mg Oral QHS   midodrine  10 mg Oral Q M,W,F   multivitamin  1 tablet Oral Daily   pantoprazole  40 mg Oral Daily   saccharomyces boulardii  250 mg Oral QPM   senna  1 tablet Oral QHS   sevelamer carbonate  800-2,400 mg Oral See admin instructions   ticagrelor  90 mg Oral BID   Continuous Infusions:  PRN Meds: HYDROmorphone (DILAUDID) injection, methocarbamol, oxyCODONE-acetaminophen, oxymetazoline  Allergies:    Allergies  Allergen Reactions   Bee Venom Anaphylaxis   Lyrica [Pregabalin] Other (See Comments)    hallucinations   Prednisone Other (See Comments)    hallucinations   Zocor [Simvastatin] Nausea Only and Other (See Comments)    Headache with brand name only.  Can take the generic.    Social History:   Social History   Socioeconomic History   Marital status: Legally Separated    Spouse name: Not on file   Number of children: 1   Years of education: college   Highest education level: Not on file  Occupational History   Occupation: Retired  Tobacco Use   Smoking status: Former    Packs/day: 1.50    Years: 54.00    Total pack years: 81.00    Types: Cigarettes    Quit date: 07/18/2007    Years since quitting: 14.8    Passive exposure: Never   Smokeless tobacco:  Never  Vaping Use   Vaping Use: Never used  Substance and Sexual Activity   Alcohol use: Yes    Comment: rare   Drug use: No   Sexual activity: Not Currently  Other Topics Concern   Not on file  Social History Narrative   Married 1994 (together since 1989) 1 son, 1 stepson. 3 grandkids, 6 great grandkids      Retired from [employer]. Had 2 years of collge.       Faith: Mormon      Here on [employer] [employer] since 2004 from [place];    Social Determinants of  Health   Financial Resource Strain: Not on file  Food Insecurity: No Food Insecurity (05/07/2022)   Hunger Vital Sign    Worried About Running Out of Food in the Last Year: Never true    Ran Out of Food in the Last Year: Never true  Transportation Needs: No Transportation Needs (05/07/2022)   PRAPARE - Hydrologist (Medical): No    Lack of Transportation (Non-Medical): No  Physical Activity: Not on file  Stress: Not on file  Social Connections: Not on file  Intimate Partner Violence: Not At Risk (05/07/2022)   Humiliation, Afraid, Rape, and Kick questionnaire    Fear of Current or Ex-Partner: No    Emotionally Abused: No    Physically Abused: No    Sexually Abused: No    Family History:   Family History  Problem Relation Age of Onset   Stroke Mother    Leukemia Father    Stroke Sister    Heart attack Brother      ROS:  Please see the history of present illness.  All other ROS reviewed and negative.     Physical Exam/Data:   Vitals:   05/29/22 1257 05/29/22 1300 05/29/22 1315 05/29/22 1330  BP:  (!) 141/87 (!) 126/54 (!) 107/41  Pulse:  82 80 80  Resp:  15 15 (!) 24  Temp: 98.7 F (37.1 C)     TempSrc: Oral     SpO2:  93% 95% 93%  Weight:      Height:       No intake or output data in the 24 hours ending 05/29/22 1354    05/29/2022    8:24 AM 05/19/2022    2:29 PM 05/19/2022   11:25 AM  Last 3 Weights  Weight (lbs) 213 lb 213 lb 6.4 oz 214 lb 3.2 oz  Weight (kg) 96.616 kg 96.798 kg 97.16 kg     Body mass index is 33.36 kg/m.  General: 88 y.o. Caucasian male resting comfortably in no acute distress. HEENT: Normocephalic and atraumatic. Sclera clear.  Neck: Supple. No JVD. Heart: RRR. Distinct S1 and S2. III/VI systolic murmur. No gallops or rubs. Radial pulses 2+ and equal bilaterally. Lungs: No increased work of breathing. Clear to ausculation bilaterally. No wheezes, rhonchi, or rales.  Abdomen: Soft, non-distended, and  non-tender to palpation.  Extremities: No to trace lower extremity edema of his right leg. Left leg currently wrapped in ACE bandage.    Skin: Warm and dry. Neuro: Alert and oriented x3. No focal deficits. Psych: Normal affect. Responds appropriately.   EKG:  The EKG was personally reviewed and demonstrates:  AV paced rhythm. Telemetry:  Telemetry was personally reviewed and demonstrates:  AV paced rhythm.  Relevant CV Studies:  Echocardiogram 05/07/2022: Impressions: 1. Left ventricular ejection fraction, by estimation, is 45 to 50%. The  left ventricle  has mildly decreased function. The left ventricle  demonstrates regional wall motion abnormalities (see scoring  diagram/findings for description). Left ventricular  diastolic parameters are indeterminate.   2. Right ventricular systolic function is normal. The right ventricular  size is normal. There is normal pulmonary artery systolic pressure.   3. The mitral valve is normal in structure. Trivial mitral valve  regurgitation. No evidence of mitral stenosis. Moderate mitral annular  calcification.   4. The aortic valve is normal in structure. There is mild calcification  of the aortic valve. There is mild thickening of the aortic valve. Aortic  valve regurgitation is trivial. Moderate aortic valve stenosis. Aortic  valve area, by VTI measures 1.42  cm. Aortic valve mean gradient measures 20.0 mmHg. Aortic valve Vmax  measures 2.97 m/s.   5. The inferior vena cava is normal in size with greater than 50%  respiratory variability, suggesting right atrial pressure of 3 mmHg.   Comparison(s): A prior study was performed on 11/2021. No significant  change from prior study.  _______________  Coronary Angiography 05/07/2022:   Ost RCA to Prox RCA lesion is 70% stenosed.  Small vessel.   Prox Cx lesion is 30% stenosed.   Mid Cx to Dist Cx lesion is 40% stenosed.   Mid Cx lesion is 30% stenosed.   2nd Diag-1 lesion is 30% stenosed.    3rd Mrg lesion is 50% stenosed.   Non-stenotic 2nd Diag-2 lesion was previously treated.  Patent stent.   Dist LM to Ost LAD lesion is 90% stenosed.   Scoring balloon angioplasty was performed using a BALLN SCOREFLEX 3.50X10.   Post intervention, there is a 20% residual stenosis.   Another episode of restenosis in the ostial to proximal LAD.  Successful balloon angioplasty with score flex balloon at high pressure followed by Flippin balloon at high pressure.   Unfortunately, there are no other options at our facility.  We will need to look into availability of drug-coated balloon or brachytherapy as possible treatments if this recurs.  Diagnostic Dominance: Right  Intervention       Laboratory Data:  High Sensitivity Troponin:   Recent Labs  Lab 05/06/22 1435 05/06/22 1746 05/07/22 1029 05/07/22 1217  TROPONINIHS 220* 275* 1,037* 962*     Chemistry Recent Labs  Lab 05/25/22 0000 05/28/22 2357  NA  --  139  K 5.7* 5.0  CL  --  96*  CO2  --  27  GLUCOSE  --  124*  BUN  --  23  CREATININE  --  6.90*  CALCIUM  --  9.3  GFRNONAA  --  7*  ANIONGAP  --  16*    Recent Labs  Lab 05/25/22 0000  ALBUMIN 3.9   Lipids No results for input(s): "CHOL", "TRIG", "HDL", "LABVLDL", "LDLCALC", "CHOLHDL" in the last 168 hours.  Hematology Recent Labs  Lab 05/25/22 0000 05/28/22 2357  WBC  --  5.0  RBC  --  3.36*  HGB 10.4* 11.7*  HCT  --  35.7*  MCV  --  106.3*  MCH  --  34.8*  MCHC  --  32.8  RDW  --  15.0  PLT  --  120*   Thyroid No results for input(s): "TSH", "FREET4" in the last 168 hours.  BNPNo results for input(s): "BNP", "PROBNP" in the last 168 hours.  DDimer No results for input(s): "DDIMER" in the last 168 hours.   Radiology/Studies:  DG Ankle Complete Left  Result Date: 05/29/2022 CLINICAL DATA:  Left ankle fracture subluxation, post reduction imaging EXAM: LEFT ANKLE COMPLETE - 3+ VIEW COMPARISON:  05/28/2022 FINDINGS: Interval closed reduction of left  ankle fracture subluxation. Distal left fibular diaphyseal fracture demonstrates 1/2 shaft with lateral displacement and mild posterior angulation. Medial joint spacel widening is improved with residual 1 cm lateral subluxation of the talar dome in relation to the tibial plafond. External immobilizer has been placed. Bimalleolar soft tissue swelling again noted. IMPRESSION: 1. Interval closed reduction of left ankle fracture subluxation. Residual lateral subluxation of the talar dome in relation to the tibial plafond. Electronically Signed   By: Fidela Salisbury M.D.   On: 05/29/2022 01:55   DG Ankle Complete Left  Result Date: 05/29/2022 CLINICAL DATA:  Fall, left ankle pain EXAM: LEFT ANKLE COMPLETE - 3+ VIEW COMPARISON:  None Available. FINDINGS: Acute fracture subluxation of the left ankle noted. There is an oblique fracture of the distal left fibular diaphysis 3 cm proximal to the tibial plafond and with 1 shaft with lateral displacement and moderate posterior angulation of the distal fracture fragment. There is 2 cm lateral and 1.5 cm posterior subluxation of the talar dome in relation to the tibial plafond with marked widening of the medial joint space. Multiple ossific densities seen within the medial joint space appear corticated likely reflect the sequela of remote trauma or inflammation. There is moderate bimalleolar soft tissue swelling. Vascular calcifications are noted. IMPRESSION: 1. Acute fracture subluxation of the left ankle as described above. 2. Moderate bimalleolar soft tissue swelling. Electronically Signed   By: Fidela Salisbury M.D.   On: 05/29/2022 00:08     Assessment and Plan:   Pre-Op Evaluation Patient presented after a mechanical fall at home and was found to have a left ankle fracture. Plan is for surgery on 06/01/2022. Patient has a complex history as described above. He had PCI with DES to LAD in 11/2021 and since then has had multiple readmission for in-stent restenosis  requiring angioplasty most recently on 05/07/2022. He has done well since then with no recurrent angina. Patient has chronic dyspnea with exertion which is stable. No shortness at breath. No palpitations or syncope. Per Revised Cardiac Risk Index, considered class IV risk with 15% chance of death, MI, or cardiac death within 30 days. Patient understands he is at high risk for cardiac complications. If any non-surgical option is available that will not significantly impact patient's mobility/ function, this should be considered. I spoke with Ortho and it does not sound like there is a good alternative option. However, they did say that they think they should be able to complete the surgery on DAPT (Aspirin and Brilinta). If Brilinta must be held for surgery, he will need to be bridged with Cangrelor. Asked Ortho to let us know if Brilinta will need to be held so we can have Pharmacy help [place] with the Cangrelor bridge.  CAD with Recent NSTEMI Patient has a long history of CAD with PCI to LAD, 2nd Diag, and OM3. He underwent PCI with DES to ostial to proximal LAD in 11/2021 in setting of NSTEMI. Since then he has been admitted multiple times for NSTEMI due to in-stent restenosis of LAD stent due to stent not being fully expanded due to calcification. He underwent angioplasty to in-stent restenosis in 12/2021, 01/2022, 03/2022, and most recently 05/07/2022.  - No recurrent chest pain since last angioplasty. - Continue DAPT with Aspirin and Brilinta. - Continue home Coreg 3.135m twice daily and Imdur 185mdaily. - Continue high-intensity  statin. - Previously on Coreg but this was stopped due to hypotension.   Ischemic Cardiomyopathy Chronic Systolic CHF Most recent Echo in 04/2022 showed LVEF of 45-50% with hypokinesis of the apical septal segment, apical inferior segment, and apex as well moderate aortic stenosis. - Euvolemic on exam.  - Volume status is being managed by hemodialysis.  - Continue home Coreg  3.137m twice daily and Imdur 16mdaily. - No ACEi/ARB due to renal function.   Moderate Aortic Stenosis Noted on Echo in 04/2022. - Can continue to monitor as an outpatient.   Complete Heart Block S/p ICD-CRT with recent gen change in 09/2021. - Followed by Dr. KlCaryl Comes  Subclinical Atrial Fibrillation History of atrial fibrillation noted on prior device check in 2021. Not on anticoagulation due to low atrial fibrillation burden. No atrial fibrillation noted on device interrogations since 06/2020.    Hypotension History of hypertension but struggles with hypotension on dialysis days requiring Midodrine. - Continue home Coreg 3.1252mwice daily and Imdur 2m2mily. - Continue Midodrine on dialysis days.   Hyperlipidemia LDL 79 in 12/2021. - Continue Lipitor 80mg14mly and Zetia 10mg 32my. Also on Repatha at home.   Type 2 Diabetes Mellitus Hemoglobin A1c 7.5 in 11/2021. - Management per primary team.   ESRD On hemodialysis on M/W/F. Takes Midodrine on dialysis days. - Management per Nephrology.      Risk Assessment/Risk Scores:    New York Heart Association (NYHA) Functional Class NYHA Class III  For questions or updates, please contact [facility] consult [email or web address] for contact info under    Signed, CallieDarreld Mclean  05/29/2022 1:54 PM

## 2022-05-30 DIAGNOSIS — S82892D Other fracture of left lower leg, subsequent encounter for closed fracture with routine healing: Secondary | ICD-10-CM

## 2022-05-30 DIAGNOSIS — E875 Hyperkalemia: Secondary | ICD-10-CM | POA: Diagnosis not present

## 2022-05-30 DIAGNOSIS — N186 End stage renal disease: Secondary | ICD-10-CM | POA: Diagnosis not present

## 2022-05-30 DIAGNOSIS — Z992 Dependence on renal dialysis: Secondary | ICD-10-CM | POA: Diagnosis not present

## 2022-05-30 LAB — CBC
HCT: 27.3 % — ABNORMAL LOW (ref 39.0–52.0)
Hemoglobin: 9.3 g/dL — ABNORMAL LOW (ref 13.0–17.0)
MCH: 35.4 pg — ABNORMAL HIGH (ref 26.0–34.0)
MCHC: 34.1 g/dL (ref 30.0–36.0)
MCV: 103.8 fL — ABNORMAL HIGH (ref 80.0–100.0)
Platelets: 120 10*3/uL — ABNORMAL LOW (ref 150–400)
RBC: 2.63 MIL/uL — ABNORMAL LOW (ref 4.22–5.81)
RDW: 15 % (ref 11.5–15.5)
WBC: 6.1 10*3/uL (ref 4.0–10.5)
nRBC: 0 % (ref 0.0–0.2)

## 2022-05-30 LAB — HEMOGLOBIN A1C
Hgb A1c MFr Bld: 5.9 % — ABNORMAL HIGH (ref 4.8–5.6)
Mean Plasma Glucose: 122.63 mg/dL

## 2022-05-30 LAB — RENAL FUNCTION PANEL
Albumin: 2.9 g/dL — ABNORMAL LOW (ref 3.5–5.0)
Anion gap: 12 (ref 5–15)
BUN: 35 mg/dL — ABNORMAL HIGH (ref 8–23)
CO2: 30 mmol/L (ref 22–32)
Calcium: 8.6 mg/dL — ABNORMAL LOW (ref 8.9–10.3)
Chloride: 96 mmol/L — ABNORMAL LOW (ref 98–111)
Creatinine, Ser: 8.8 mg/dL — ABNORMAL HIGH (ref 0.61–1.24)
GFR, Estimated: 5 mL/min — ABNORMAL LOW (ref >60)
Glucose, Bld: 99 mg/dL (ref 70–99)
Phosphorus: 5.2 mg/dL — ABNORMAL HIGH (ref 2.5–4.6)
Potassium: 5.3 mmol/L — ABNORMAL HIGH (ref 3.5–5.1)
Sodium: 138 mmol/L (ref 135–145)

## 2022-05-30 MED ORDER — MIDODRINE HCL 5 MG PO TABS
10.0000 mg | ORAL_TABLET | Freq: Once | ORAL | Status: AC
  Administered 2022-05-30: 10 mg via ORAL
  Filled 2022-05-30: qty 2

## 2022-05-30 MED ORDER — HEPARIN SODIUM (PORCINE) 1000 UNIT/ML DIALYSIS
2000.0000 [IU] | INTRAMUSCULAR | Status: DC | PRN
  Administered 2022-05-30: 2000 [IU] via INTRAVENOUS_CENTRAL
  Filled 2022-05-30: qty 2

## 2022-05-30 NOTE — Progress Notes (Addendum)
Received patient in bed to unit.  Alert and oriented.  Informed consent signed and in chart.   Treatment initiated:  Treatment completed:   Patient tolerated well.  Transported back to the room  Alert, without acute distress.  Hand-off given to patient's nurse.   Access used: fistula right arm Access issues: none  Total UF removed: 2.6 liters Medication(s) given: oxycodone, heparin 2000 units Post HD weight:    [facility]  05/30/22 1330  Vitals  BP (!) 117/35  MAP (mmHg) (!) 59  ECG Heart Rate 80  Resp (!) 21  Oxygen Therapy  SpO2 98 %  O2 Device Room Air  MEWS Score  MEWS Temp 0  MEWS Systolic 0  MEWS Pulse 0  MEWS RR 1  MEWS LOC 0  MEWS Score 1  MEWS Score Color Green

## 2022-05-30 NOTE — Plan of Care (Signed)
  Problem: Nutrition: Goal: Adequate nutrition will be maintained Outcome: Progressing   Problem: Coping: Goal: Level of anxiety will decrease Outcome: Progressing   Problem: Pain Managment: Goal: General experience of comfort will improve Outcome: Progressing   

## 2022-05-30 NOTE — Progress Notes (Signed)
PROGRESS NOTE   Rodney Farley  DEY:[id number]    DOB: 30-Apr-1932    DOA: 05/28/2022  PCP: [name], MD   I have briefly reviewed patients previous medical records in [facility].  Chief Complaint  Patient presents with   Leg Injury    Brief Narrative:  88 year old male, lives alone, ambulates with the help of a cane or a walker, PMH of extensive cardiac history including CAD, NSTEMI, multiple PCI's including most recently on 05/07/2022 when he had PCI with balloon angioplasty for recurrent in-stent stenosis of LAD, ischemic cardiomyopathy, chronic systolic CHF with most recent LVEF of 45-50% on 11/11/2021, complete heart block s/p PPM which was upgraded to ICD-CRT in 2017, subclinical atrial fibrillation not on anticoagulation, moderate aortic stenosis, chronic hypotension on midodrine, HLD, DM 2, ESRD on HD MWF, OSA, COPD, TIA presented to the ED on 05/29/2022 following a mechanical fall at home the night prior followed by severe pain in his left ankle.  Diagnosed with left ankle fracture.  Orthopedics consulted and plan surgical intervention early next week.  Cardiology consulted and provided preop clearance.  Assessment & Plan:  Principal Problem:   Closed left ankle fracture Active Problems:   Accidental fall   Hypotension   ESRD on hemodialysis (HCC)   Ischemic cardiomyopathy   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   Chronic systolic CHF (congestive heart failure) (HCC)   CAD in native artery   Anemia in chronic kidney disease   Atrioventricular block, complete (HCC)   Pacemaker   Type II diabetes mellitus ([place])   Hyperlipidemia   Obesity (BMI 30-39.9)   Sleep apnea   Thrombocytopenia (HCC)   Essential hypertension   ESRD on dialysis ([place])   S/P coronary artery stent placement   Left ankle fracture: Sustained s/p mechanical fall after he slipped on some paper at home.  Initial left ankle x-ray: Acute ankle fracture subluxation of  the left ankle.  Follow-up left ankle x-ray: Interval closed reduction of left ankle fracture subluxation.  Orthopedics consultation appreciated, NWB LLE, plan ORIF sometime next week.  Please refer to cardiology consultation note from 11/17 for preop clearance.  They indicate that he is a high surgical risk given his multiple comorbidities.  Holding P2 Y12 inhibitors will be very high risk given his recurrent in-stent restenosis of this region and his recent procedures.  They do not recommend any further cardiac testing or optimization for surgery.  They prefer conservative measures if possible.  If surgery required and ticagrelor needs to be held, they recommend transitioning to cangrelor perioperatively.  Patient indicates no pain while nonweightbearing.  Multimodality pain control  Chronic hypotension: Clinically not volume overloaded.  Gets midodrine pre-HD 3 times a week.   ESRD on HD MWF/mild hyperkalemia: Nephrology consulted.  Underwent of scheduled HD on 11/18 and as per the Thanksgiving holiday schedule, will have HD again on 11/19.   Chronic systolic CHF ischemic cardiomyopathy status post AICD Clinically euvolemic.  Volume management across HD.  Last echocardiogram noted EF to be 45 -50% with indeterminate diastolic parameters.     CAD, NSTEMI, multiple PCI's Extensive cardiac history as noted above.  Currently without anginal symptoms.  Most recent angioplasty and PCI of LAD in-stent stenosis on 05/07/2022. Continue aspirin, Brilinta, and statin.   Anemia of chronic kidney disease/ESRD Patient is on vitamin B12 supplementation and EPO shots with dialysis, to be continued.  Stable.   Complete heart block s/p permanent pacemaker AV paced on telemetry.  Diabetes mellitus type II, without long-term use of insulin A1c 5.9 suggests tight control. Do not see any CBGs checked, ordered same.   Hyperlipidemia Goal LDL less than 50. Continue atorvastatin and Zetia. Resume Repatha in  outpatient setting   Thrombocytopenia Chronic and stable.   OSA Patient reports that he does not use CPAP at night.  Body mass index is 33.6 kg/m./Obesity   DVT prophylaxis: heparin injection 5,000 Units Start: 05/29/22 0915     Code Status: Full Code:  Family Communication: None at bedside Disposition:  Status is: Inpatient Remains inpatient appropriate because: Need for surgical intervention for left ankle fracture     Consultants:   Orthopedics Cardiology Nephrology  Procedures:   HD  Antimicrobials:      Subjective:  Seen this morning while at dialysis.  Reports no longer having any pain in the left ankle as long as he does not weight-bear.  Denies chest pain or dyspnea.  Predominantly uses cane to ambulate and sometimes his walker.  Objective:   Vitals:   05/30/22 1130 05/30/22 1200 05/30/22 1230 05/30/22 1303  BP: (!) 132/53 (!) 128/110 (!) 115/56 (!) 117/101  Pulse:      Resp: (!) 24 (!) 21 (!) 22 20  Temp:      TempSrc:      SpO2: 100%     Weight:      Height:        General exam: Elderly male, moderately built and obese lying comfortably supine in bed without distress. Respiratory system: Clear to auscultation. Respiratory effort normal. Cardiovascular system: S1 & S2 heard, RRR. No JVD, murmurs, rubs, gallops or clicks. No pedal edema.  Telemetry personally reviewed: AV paced rhythm. Gastrointestinal system: Abdomen is nondistended, soft and nontender. No organomegaly or masses felt. Normal bowel sounds heard.  Long midline laparotomy scar. Central nervous system: Alert and oriented. No focal neurological deficits. Extremities: Symmetric 5 x 5 power except left ankle due to pain and currently has crepe bandage around.. Skin: No rashes, lesions or ulcers Psychiatry: Judgement and insight appear normal. Mood & affect appropriate.     Data Reviewed:   I have personally reviewed following labs and imaging studies   CBC: Recent Labs  Lab  06-03-2022 0000 05/28/22 2357 05/30/22 0219  WBC  --  5.0 6.1  NEUTROABS  --  2.8  --   HGB 10.4* 11.7* 9.3*  HCT  --  35.7* 27.3*  MCV  --  106.3* 103.8*  PLT  --  120* 120*    Basic Metabolic Panel: Recent Labs  Lab 2022-06-03 0000 05/28/22 2357 05/30/22 0219  NA  --  139 138  K 5.7* 5.0 5.3*  CL  --  96* 96*  CO2  --  27 30  GLUCOSE  --  124* 99  BUN  --  23 35*  CREATININE  --  6.90* 8.80*  CALCIUM  --  9.3 8.6*  PHOS  --  4.3 5.2*    Liver Function Tests: Recent Labs  Lab 06/03/2022 0000 05/30/22 0219  ALBUMIN 3.9 2.9*    CBG: No results for input(s): "GLUCAP" in the last 168 hours.  Microbiology Studies:  No results found for this or any previous visit (from the past 240 hour(s)).  Radiology Studies:  DG Ankle Complete Left  Result Date: 05/29/2022 CLINICAL DATA:  Left ankle fracture subluxation, post reduction imaging EXAM: LEFT ANKLE COMPLETE - 3+ VIEW COMPARISON:  05/28/2022 FINDINGS: Interval closed reduction of left ankle fracture subluxation. Distal  left fibular diaphyseal fracture demonstrates 1/2 shaft with lateral displacement and mild posterior angulation. Medial joint spacel widening is improved with residual 1 cm lateral subluxation of the talar dome in relation to the tibial plafond. External immobilizer has been placed. Bimalleolar soft tissue swelling again noted. IMPRESSION: 1. Interval closed reduction of left ankle fracture subluxation. Residual lateral subluxation of the talar dome in relation to the tibial plafond. Electronically Signed   By: Fidela Salisbury M.D.   On: 05/29/2022 01:55   DG Ankle Complete Left  Result Date: 05/29/2022 CLINICAL DATA:  Fall, left ankle pain EXAM: LEFT ANKLE COMPLETE - 3+ VIEW COMPARISON:  None Available. FINDINGS: Acute fracture subluxation of the left ankle noted. There is an oblique fracture of the distal left fibular diaphysis 3 cm proximal to the tibial plafond and with 1 shaft with lateral displacement and  moderate posterior angulation of the distal fracture fragment. There is 2 cm lateral and 1.5 cm posterior subluxation of the talar dome in relation to the tibial plafond with marked widening of the medial joint space. Multiple ossific densities seen within the medial joint space appear corticated likely reflect the sequela of remote trauma or inflammation. There is moderate bimalleolar soft tissue swelling. Vascular calcifications are noted. IMPRESSION: 1. Acute fracture subluxation of the left ankle as described above. 2. Moderate bimalleolar soft tissue swelling. Electronically Signed   By: Fidela Salisbury M.D.   On: 05/29/2022 00:08    Scheduled Meds:    acidophilus  1 capsule Oral Daily   aspirin EC  81 mg Oral Daily   atorvastatin  80 mg Oral QHS   [START ON 06/01/2022] calcitRIOL  1.75 mcg Oral Q M,W,F-HD   carvedilol  3.125 mg Oral 2 times per day on Sun Tue Thu Sat   carvedilol  3.125 mg Oral Once per day on Mon Wed Fri   Chlorhexidine Gluconate Cloth  6 each Topical Q0600   cyanocobalamin  1,000 mcg Oral Daily   ezetimibe  10 mg Oral Daily   gabapentin  100 mg Oral BID   Followed by   Derrill Memo ON 06/05/2022] gabapentin  100 mg Oral QHS   heparin  5,000 Units Subcutaneous Q8H   isosorbide mononitrate  15 mg Oral Daily   melatonin  3 mg Oral QHS   midodrine  10 mg Oral Q M,W,F   multivitamin  1 tablet Oral Daily   pantoprazole  40 mg Oral Daily   senna  1 tablet Oral QHS   sevelamer carbonate  1,600 mg Oral With snacks   sevelamer carbonate  2,400 mg Oral BID WC   ticagrelor  90 mg Oral BID    Continuous Infusions:     LOS: 1 day     Vernell Leep, MD,  FACP, FHM, SFHM, [facility] [place], [place]     To contact the attending provider between 7A-7P or the covering provider during after hours 7P-7A, please log into the web site [email or web address] and access using universal [place] password for that web site. If you do not have  the password, please call the hospital operator.  05/30/2022, 1:30 PM

## 2022-05-30 NOTE — Progress Notes (Signed)
PT Cancellation Note  Patient Details Name: Rodney Farley MRN: [id number] DOB: August 13, 1931   Cancelled Treatment:    Reason Eval/Treat Not Completed: Patient at procedure or test/unavailable Off unit at HD.   Wofford Stratton A. Gilford Rile PT, DPT Acute Rehabilitation Services Office [phone]    Linna Hoff 05/30/2022, 2:07 PM

## 2022-05-30 NOTE — Progress Notes (Signed)
[facility] Progress Note   Subjective:   Patient seen and examined at bedside in room.  Reports terrible pain in ankle w/movement.  OK right now as he has been still.  Denies CP, SOB, abdominal pain and n/v/d.  Concerned about being able to get home and complete ADLs post surgery.   Objective Vitals:   05/29/22 2225 05/30/22 0253 05/30/22 0900 05/30/22 1010  BP: 129/67 128/76 (!) 131/56   Pulse: 80 80 79 80  Resp: 18 18    Temp: 100.1 F (37.8 C) 98.7 F (37.1 C) 98.5 F (36.9 C)   TempSrc: Oral Oral Oral   SpO2: 94% 95% 97% 98%  Weight:      Height:       Physical Exam General:WDWN elderly male in NAD Heart:RRR, +2/4 systolic murmur Lungs:CTAB nml WOB on RA Abdomen:soft, NTND Extremities:trace LE edema Dialysis Access: RU AVG +b/t   Filed Weights   05/29/22 0824 05/29/22 1430  Weight: 96.6 kg 97.3 kg    Intake/Output Summary (Last 24 hours) at 05/30/2022 1058 Last data filed at 05/30/2022 0900 Gross per 24 hour  Intake 0 ml  Output --  Net 0 ml    Additional Objective Labs: Basic Metabolic Panel: Recent Labs  Lab 05/25/22 0000 05/28/22 2357 05/30/22 0219  NA  --  139 138  K 5.7* 5.0 5.3*  CL  --  96* 96*  CO2  --  27 30  GLUCOSE  --  124* 99  BUN  --  23 35*  CREATININE  --  6.90* 8.80*  CALCIUM  --  9.3 8.6*  PHOS  --  4.3 5.2*   Liver Function Tests: Recent Labs  Lab 05/25/22 0000 05/30/22 0219  ALBUMIN 3.9 2.9*    CBC: Recent Labs  Lab 05/25/22 0000 05/28/22 2357 05/30/22 0219  WBC  --  5.0 6.1  NEUTROABS  --  2.8  --   HGB 10.4* 11.7* 9.3*  HCT  --  35.7* 27.3*  MCV  --  106.3* 103.8*  PLT  --  120* 120*   Studies/Results: DG Ankle Complete Left  Result Date: 05/29/2022 CLINICAL DATA:  Left ankle fracture subluxation, post reduction imaging EXAM: LEFT ANKLE COMPLETE - 3+ VIEW COMPARISON:  05/28/2022 FINDINGS: Interval closed reduction of left ankle fracture subluxation. Distal left fibular diaphyseal fracture  demonstrates 1/2 shaft with lateral displacement and mild posterior angulation. Medial joint spacel widening is improved with residual 1 cm lateral subluxation of the talar dome in relation to the tibial plafond. External immobilizer has been placed. Bimalleolar soft tissue swelling again noted. IMPRESSION: 1. Interval closed reduction of left ankle fracture subluxation. Residual lateral subluxation of the talar dome in relation to the tibial plafond. Electronically Signed   By: Fidela Salisbury M.D.   On: 05/29/2022 01:55   DG Ankle Complete Left  Result Date: 05/29/2022 CLINICAL DATA:  Fall, left ankle pain EXAM: LEFT ANKLE COMPLETE - 3+ VIEW COMPARISON:  None Available. FINDINGS: Acute fracture subluxation of the left ankle noted. There is an oblique fracture of the distal left fibular diaphysis 3 cm proximal to the tibial plafond and with 1 shaft with lateral displacement and moderate posterior angulation of the distal fracture fragment. There is 2 cm lateral and 1.5 cm posterior subluxation of the talar dome in relation to the tibial plafond with marked widening of the medial joint space. Multiple ossific densities seen within the medial joint space appear corticated likely reflect the sequela of remote trauma or  inflammation. There is moderate bimalleolar soft tissue swelling. Vascular calcifications are noted. IMPRESSION: 1. Acute fracture subluxation of the left ankle as described above. 2. Moderate bimalleolar soft tissue swelling. Electronically Signed   By: Fidela Salisbury M.D.   On: 05/29/2022 00:08    Medications:   acidophilus  1 capsule Oral Daily   aspirin EC  81 mg Oral Daily   atorvastatin  80 mg Oral QHS   [START ON 06/01/2022] calcitRIOL  1.75 mcg Oral Q M,W,F-HD   carvedilol  3.125 mg Oral 2 times per day on Sun Tue Thu Sat   carvedilol  3.125 mg Oral Once per day on Mon Wed Fri   Chlorhexidine Gluconate Cloth  6 each Topical Q0600   cyanocobalamin  1,000 mcg Oral Daily   ezetimibe   10 mg Oral Daily   gabapentin  100 mg Oral BID   Followed by   Derrill Memo ON 06/05/2022] gabapentin  100 mg Oral QHS   heparin  5,000 Units Subcutaneous Q8H   isosorbide mononitrate  15 mg Oral Daily   melatonin  3 mg Oral QHS   midodrine  10 mg Oral Q M,W,F   multivitamin  1 tablet Oral Daily   pantoprazole  40 mg Oral Daily   senna  1 tablet Oral QHS   sevelamer carbonate  1,600 mg Oral With snacks   sevelamer carbonate  2,400 mg Oral BID WC   ticagrelor  90 mg Oral BID    Dialysis Orders: MWF NW  4h   400/500  94kg  2/2 bath  Hep 2500+ 1073midrun  RUA AVG - last HD 11/15, post 95.4kg - calcitriol 1.75 ug po tiw - mircera 75 ug q4, last 11/6, due 12/04   Assessment/ Plan: L ankle fracture - to OR next week, High risk for surgery per cardiology.  ESRD - on HD MWF. HD today off schedule.  Per Thanksgiving Holiday schedule will run again tomorrow.   BP/volume - chronic low BP's. Gets midodrine pre HD tiw. Does not appear fluid overloaded. 3kg up by wts. UF as tolerated.  HFrEF - EF 45-50% CAD - hx of PCI's CHB sp PPM Anemia esrd - Hb 10-12 here, no esa needs.  MBD ckd - CCa and phos are in range. Cont po vdra.  DM2 - on insulin, per pmd\ Nutrition - Renal diet w/fluid restrictions.   Jen Mow, PA-C [facility] 05/30/2022,10:58 AM  LOS: 1 day

## 2022-05-31 DIAGNOSIS — Z992 Dependence on renal dialysis: Secondary | ICD-10-CM | POA: Diagnosis not present

## 2022-05-31 DIAGNOSIS — N186 End stage renal disease: Secondary | ICD-10-CM | POA: Diagnosis not present

## 2022-05-31 DIAGNOSIS — S82892D Other fracture of left lower leg, subsequent encounter for closed fracture with routine healing: Secondary | ICD-10-CM | POA: Diagnosis not present

## 2022-05-31 DIAGNOSIS — E875 Hyperkalemia: Secondary | ICD-10-CM | POA: Diagnosis not present

## 2022-05-31 LAB — RENAL FUNCTION PANEL
Albumin: 3 g/dL — ABNORMAL LOW (ref 3.5–5.0)
Anion gap: 13 (ref 5–15)
BUN: 22 mg/dL (ref 8–23)
CO2: 30 mmol/L (ref 22–32)
Calcium: 8.7 mg/dL — ABNORMAL LOW (ref 8.9–10.3)
Chloride: 92 mmol/L — ABNORMAL LOW (ref 98–111)
Creatinine, Ser: 6.28 mg/dL — ABNORMAL HIGH (ref 0.61–1.24)
GFR, Estimated: 8 mL/min — ABNORMAL LOW (ref >60)
Glucose, Bld: 156 mg/dL — ABNORMAL HIGH (ref 70–99)
Phosphorus: 4 mg/dL (ref 2.5–4.6)
Potassium: 4.8 mmol/L (ref 3.5–5.1)
Sodium: 135 mmol/L (ref 135–145)

## 2022-05-31 LAB — HEPATITIS B SURFACE ANTIGEN: Hepatitis B Surface Ag: NONREACTIVE

## 2022-05-31 MED ORDER — MIDODRINE HCL 5 MG PO TABS: 10.0000 mg | ORAL_TABLET | Freq: Once | ORAL | Status: DC

## 2022-05-31 MED ORDER — HEPARIN SODIUM (PORCINE) 1000 UNIT/ML IJ SOLN
INTRAMUSCULAR | Status: AC
  Filled 2022-05-31: qty 6

## 2022-05-31 NOTE — Plan of Care (Signed)
  Problem: Cardiovascular: Goal: Ability to achieve and maintain adequate cardiovascular perfusion will improve Outcome: Progressing   Problem: Pain Managment: Goal: General experience of comfort will improve Outcome: Progressing

## 2022-05-31 NOTE — Progress Notes (Signed)
PT Cancellation Note  Patient Details Name: Rodney Farley MRN: [id number] DOB: 1932/01/05   Cancelled Treatment:    Reason Eval/Treat Not Completed: Patient at procedure or test/unavailable  Currently in HD; Noted likely plans for OR tomorrow; Will continue to follow,   [name], [place] Office [place] 05/31/2022, 1:33 PM

## 2022-05-31 NOTE — Progress Notes (Addendum)
PROGRESS NOTE   Rodney Farley  EXB:[id number]    DOB: Feb 24, 1932    DOA: 05/28/2022  PCP: [name], MD   I have briefly reviewed patients previous medical records in [facility].  Chief Complaint  Patient presents with   Leg Injury    Brief Narrative:  88 year old male, lives alone, ambulates with the help of a cane or a walker, PMH of extensive cardiac history including CAD, NSTEMI, multiple PCI's including most recently on 05/07/2022 when he had PCI with balloon angioplasty for recurrent in-stent stenosis of LAD, ischemic cardiomyopathy, chronic systolic CHF with most recent LVEF of 45-50% on 11/11/2021, complete heart block s/p PPM which was upgraded to ICD-CRT in 2017, subclinical atrial fibrillation not on anticoagulation, moderate aortic stenosis, chronic hypotension on midodrine, HLD, DM 2, ESRD on HD MWF, OSA, COPD, TIA presented to the ED on 05/29/2022 following a mechanical fall at home the night prior followed by severe pain in his left ankle.  Diagnosed with left ankle fracture.  Orthopedics consulted and plan surgical intervention early next week.  Cardiology consulted and provided preop clearance.  Assessment & Plan:  Principal Problem:   Closed left ankle fracture Active Problems:   Accidental fall   Hypotension   ESRD on hemodialysis (HCC)   Ischemic cardiomyopathy   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   Chronic systolic CHF (congestive heart failure) (HCC)   CAD in native artery   Anemia in chronic kidney disease   Atrioventricular block, complete (HCC)   Pacemaker   Type II diabetes mellitus ([place])   Hyperlipidemia   Obesity (BMI 30-39.9)   Sleep apnea   Thrombocytopenia (HCC)   Essential hypertension   ESRD on dialysis ([place])   S/P coronary artery stent placement   Left ankle fracture: Sustained s/p mechanical fall after he slipped on some paper at home.  Initial left ankle x-ray: Acute ankle fracture subluxation of  the left ankle.  Follow-up left ankle x-ray: Interval closed reduction of left ankle fracture subluxation.  Orthopedics consultation appreciated, NWB LLE, plan ORIF 11/20.  Please refer to cardiology consultation note from 11/17 for preop clearance.  They indicate that he is a high surgical risk given his multiple comorbidities.  Holding P2 Y12 inhibitors will be very high risk given his recurrent in-stent restenosis of this region and his recent procedures.  They do not recommend any further cardiac testing or optimization for surgery.  They prefer conservative measures if possible.  If surgery required and ticagrelor needs to be held, they recommend transitioning to cangrelor perioperatively.  Patient indicates no pain while nonweightbearing but volunteers to pain on minimal movement.  Multimodality pain control  Chronic hypotension: Clinically not volume overloaded.  Gets midodrine pre-HD 3 times a week.   ESRD on HD MWF/mild hyperkalemia: Nephrology consulted.  Underwent of scheduled HD on 11/18 and as per the Thanksgiving holiday schedule, having the assess again on 11/19.   Chronic systolic CHF ischemic cardiomyopathy status post AICD Clinically euvolemic.  Volume management across HD.  Last echocardiogram noted EF to be 45 -50% with indeterminate diastolic parameters.     CAD, NSTEMI, multiple PCI's Extensive cardiac history as noted above.  Currently without anginal symptoms.  Most recent angioplasty and PCI of LAD in-stent stenosis on 05/07/2022. Continue aspirin, Brilinta, and statin.   Anemia of chronic kidney disease/ESRD Patient is on vitamin B12 supplementation and EPO shots with dialysis, to be continued.  Stable.   Complete heart block s/p permanent pacemaker  AV paced on telemetry.   Diabetes mellitus type II, without long-term use of insulin A1c 5.9 suggests tight control.  Not on SSI.   Hyperlipidemia Goal LDL less than 50. Continue atorvastatin and Zetia. Resume Repatha in  outpatient setting   Thrombocytopenia Chronic and stable.   OSA Patient reports that he does not use CPAP at night.  Body mass index is 33.6 kg/m./Obesity   DVT prophylaxis: heparin injection 5,000 Units Start: 05/29/22 0915     Code Status: Full Code:  Family Communication: None at bedside Disposition:  Status is: Inpatient Remains inpatient appropriate because: Need for surgical intervention for left ankle fracture     Consultants:   Orthopedics Cardiology Nephrology  Procedures:   HD  Antimicrobials:      Subjective:  Ankle pain with minimal movement.  Denies any other complaints.  Extremely hard of hearing.  Objective:   Vitals:   05/31/22 1311 05/31/22 1321 05/31/22 1400 05/31/22 1430  BP: (!) 108/49  (!) 96/46 (!) 106/49  Pulse: 80 79 80 79  Resp: 11  (!) 22 (!) 21  Temp: (!) 97.3 F (36.3 C)     TempSrc: Temporal     SpO2: 98%  93% 99%  Weight:      Height:        General exam: Elderly male, moderately built and obese lying comfortably supine in bed without distress. Respiratory system: Clear to auscultation.  No increased work of breathing. Cardiovascular system: S1 & S2 heard, RRR. No JVD, murmurs, rubs, gallops or clicks. No pedal edema.  Telemetry personally reviewed: AV paced rhythm. Gastrointestinal system: Abdomen is nondistended, soft and nontender. No organomegaly or masses felt. Normal bowel sounds heard.  Long midline laparotomy scar. Central nervous system: Alert and oriented. No focal neurological deficits. Extremities: Symmetric 5 x 5 power except left ankle due to pain and currently has crepe bandage around.. Skin: No rashes, lesions or ulcers Psychiatry: Judgement and insight appear normal. Mood & affect appropriate.     Data Reviewed:   I have personally reviewed following labs and imaging studies   CBC: Recent Labs  Lab 05/25/22 0000 05/28/22 2357 05/30/22 0219  WBC  --  5.0 6.1  NEUTROABS  --  2.8  --   HGB 10.4*  11.7* 9.3*  HCT  --  35.7* 27.3*  MCV  --  106.3* 103.8*  PLT  --  120* 120*    Basic Metabolic Panel: Recent Labs  Lab 05/25/22 0000 05/28/22 2357 05/30/22 0219 05/31/22 0250  NA  --  139 138 135  K 5.7* 5.0 5.3* 4.8  CL  --  96* 96* 92*  CO2  --  27 30 30   GLUCOSE  --  124* 99 156*  BUN  --  23 35* 22  CREATININE  --  6.90* 8.80* 6.28*  CALCIUM  --  9.3 8.6* 8.7*  PHOS  --  4.3 5.2* 4.0    Liver Function Tests: Recent Labs  Lab 05/25/22 0000 05/30/22 0219 05/31/22 0250  ALBUMIN 3.9 2.9* 3.0*    CBG: No results for input(s): "GLUCAP" in the last 168 hours.  Microbiology Studies:  No results found for this or any previous visit (from the past 240 hour(s)).  Radiology Studies:  No results found.  Scheduled Meds:    acidophilus  1 capsule Oral Daily   aspirin EC  81 mg Oral Daily   atorvastatin  80 mg Oral QHS   [START ON 06/01/2022] calcitRIOL  1.75 mcg Oral  Q M,W,F-HD   carvedilol  3.125 mg Oral 2 times per day on Sun Tue Thu Sat   carvedilol  3.125 mg Oral Once per day on Mon Wed Fri   Chlorhexidine Gluconate Cloth  6 each Topical Q0600   cyanocobalamin  1,000 mcg Oral Daily   ezetimibe  10 mg Oral Daily   gabapentin  100 mg Oral BID   Followed by   Derrill Memo ON 06/05/2022] gabapentin  100 mg Oral QHS   heparin  5,000 Units Subcutaneous Q8H   heparin sodium (porcine)       isosorbide mononitrate  15 mg Oral Daily   melatonin  3 mg Oral QHS   midodrine  10 mg Oral Q M,W,F   midodrine  10 mg Oral Once   multivitamin  1 tablet Oral Daily   pantoprazole  40 mg Oral Daily   senna  1 tablet Oral QHS   sevelamer carbonate  1,600 mg Oral With snacks   sevelamer carbonate  2,400 mg Oral BID WC   ticagrelor  90 mg Oral BID    Continuous Infusions:     LOS: 2 days     Vernell Leep, MD,  FACP, [place], SFHM, [facility], [place]     To contact the attending provider between 7A-7P or the covering  provider during after hours 7P-7A, please log into the web site [email or web address] and access using universal [place] password for that web site. If you do not have the password, please call the hospital operator.  05/31/2022, 2:54 PM

## 2022-05-31 NOTE — Progress Notes (Signed)
[facility] Progress Note   Subjective:   Patient seen and examined at bedside.  Reports no neuropathy pain during dialysis yesterday which is unusual for him.  Tired this morning.  Denies CP, SOB, abdominal pain and n/v/d.  Continues to have pain in R ankle but is tolerable along as he doesn't move it.   Objective Vitals:   05/30/22 1720 05/30/22 2105 05/31/22 0614 05/31/22 0732  BP: (!) 109/55 (!) 105/51 (!) 106/46 (!) 121/45  Pulse: 79 80 73 80  Resp:  18 16 17   Temp: 98.4 F (36.9 C) 98.4 F (36.9 C) 98.4 F (36.9 C) 98.5 F (36.9 C)  TempSrc: Oral Oral    SpO2: 94% 96% 96% 93%  Weight:      Height:       Physical Exam General:pleasant, elderly male in NAD Heart:RRR, +6/0 systolic murmur Lungs:CTAB, nml WOB on RA Abdomen:soft, NTND Extremities:no LE edemam R ankle w/ace wrap in place Dialysis Access: RU AVG +b/t   Filed Weights   05/29/22 0824 05/29/22 1430  Weight: 96.6 kg 97.3 kg    Intake/Output Summary (Last 24 hours) at 05/31/2022 0822 Last data filed at 05/31/2022 1093 Gross per 24 hour  Intake 720 ml  Output 0 ml  Net 720 ml    Additional Objective Labs: Basic Metabolic Panel: Recent Labs  Lab 05/28/22 2357 05/30/22 0219 05/31/22 0250  NA 139 138 135  K 5.0 5.3* 4.8  CL 96* 96* 92*  CO2 27 30 30   GLUCOSE 124* 99 156*  BUN 23 35* 22  CREATININE 6.90* 8.80* 6.28*  CALCIUM 9.3 8.6* 8.7*  PHOS 4.3 5.2* 4.0   Liver Function Tests: Recent Labs  Lab 05/25/22 0000 05/30/22 0219 05/31/22 0250  ALBUMIN 3.9 2.9* 3.0*   CBC: Recent Labs  Lab 05/25/22 0000 05/28/22 2357 05/30/22 0219  WBC  --  5.0 6.1  NEUTROABS  --  2.8  --   HGB 10.4* 11.7* 9.3*  HCT  --  35.7* 27.3*  MCV  --  106.3* 103.8*  PLT  --  120* 120*    Medications:   acidophilus  1 capsule Oral Daily   aspirin EC  81 mg Oral Daily   atorvastatin  80 mg Oral QHS   [START ON 06/01/2022] calcitRIOL  1.75 mcg Oral Q M,W,F-HD   carvedilol  3.125 mg Oral 2  times per day on Sun Tue Thu Sat   carvedilol  3.125 mg Oral Once per day on Mon Wed Fri   Chlorhexidine Gluconate Cloth  6 each Topical Q0600   cyanocobalamin  1,000 mcg Oral Daily   ezetimibe  10 mg Oral Daily   gabapentin  100 mg Oral BID   Followed by   Derrill Memo ON 06/05/2022] gabapentin  100 mg Oral QHS   heparin  5,000 Units Subcutaneous Q8H   isosorbide mononitrate  15 mg Oral Daily   melatonin  3 mg Oral QHS   midodrine  10 mg Oral Q M,W,F   multivitamin  1 tablet Oral Daily   pantoprazole  40 mg Oral Daily   senna  1 tablet Oral QHS   sevelamer carbonate  1,600 mg Oral With snacks   sevelamer carbonate  2,400 mg Oral BID WC   ticagrelor  90 mg Oral BID    Dialysis Orders: MWF NW  4h   400/500  94kg  2/2 bath  Hep 2500+ 1068midrun  RUA AVG - last HD 11/15, post 95.4kg - calcitriol 1.75 ug po  tiw - mircera 75 ug q4, last 11/6, due 12/04   Assessment/ Plan: L ankle fracture - Plan for surgery tomorrow, High risk for surgery per cardiology.  ESRD - on HD MWF. HD today per holiday schedule. Holiday schedule this week: Sunday, Tuesday, Friday BP/volume - chronic low BP's. Gets midodrine pre HD tiw. Does not appear fluid overloaded. Net UF 2.6L yesterday. Continue UF to dry as tolerated.  HFrEF - EF 45-50% CAD - hx of PCI's CHB sp PPM Anemia esrd - last Hb 9.3, May need to give ESA early, currently dosed qmonth. MBD ckd - CCa and phos are in range. Cont po vdra.  DM2 - on insulin, per pmd Nutrition - Renal diet w/fluid restrictions.  Jen Mow, PA-C [facility] 05/31/2022,8:22 AM  LOS: 2 days

## 2022-05-31 NOTE — Progress Notes (Signed)
   05/31/22 1715  Vitals  Temp (!) 96.9 F (36.1 C)  Pulse Rate 80  Resp 20  BP 111/60  SpO2 99 %  O2 Device Room Air  Oxygen Therapy  Patient Activity (if Appropriate) In bed  Pulse Oximetry Type Continuous  Oximetry Probe Site Changed No  Post Treatment  Dialyzer Clearance Clear  Duration of HD Treatment -hour(s) 3.75 hour(s)  Hemodialysis Intake (mL) 0 mL  Liters Processed 75.3  Fluid Removed (mL) 2000 mL  Post-Hemodialysis Comments goal met tolerated well no s/s of distress to note  AVG/AVF Arterial Site Held (minutes) 10 minutes  AVG/AVF Venous Site Held (minutes) 10 minutes

## 2022-06-01 DIAGNOSIS — Z992 Dependence on renal dialysis: Secondary | ICD-10-CM | POA: Diagnosis not present

## 2022-06-01 DIAGNOSIS — S82892D Other fracture of left lower leg, subsequent encounter for closed fracture with routine healing: Secondary | ICD-10-CM | POA: Diagnosis not present

## 2022-06-01 DIAGNOSIS — E875 Hyperkalemia: Secondary | ICD-10-CM | POA: Diagnosis not present

## 2022-06-01 DIAGNOSIS — N186 End stage renal disease: Secondary | ICD-10-CM | POA: Diagnosis not present

## 2022-06-01 LAB — RENAL FUNCTION PANEL
Albumin: 3.1 g/dL — ABNORMAL LOW (ref 3.5–5.0)
Anion gap: 12 (ref 5–15)
BUN: 18 mg/dL (ref 8–23)
CO2: 29 mmol/L (ref 22–32)
Calcium: 9.3 mg/dL (ref 8.9–10.3)
Chloride: 96 mmol/L — ABNORMAL LOW (ref 98–111)
Creatinine, Ser: 5 mg/dL — ABNORMAL HIGH (ref 0.61–1.24)
GFR, Estimated: 10 mL/min — ABNORMAL LOW (ref >60)
Glucose, Bld: 101 mg/dL — ABNORMAL HIGH (ref 70–99)
Phosphorus: 4.4 mg/dL (ref 2.5–4.6)
Potassium: 4.3 mmol/L (ref 3.5–5.1)
Sodium: 137 mmol/L (ref 135–145)

## 2022-06-01 LAB — CBC
HCT: 31.3 % — ABNORMAL LOW (ref 39.0–52.0)
Hemoglobin: 10.3 g/dL — ABNORMAL LOW (ref 13.0–17.0)
MCH: 34.1 pg — ABNORMAL HIGH (ref 26.0–34.0)
MCHC: 32.9 g/dL (ref 30.0–36.0)
MCV: 103.6 fL — ABNORMAL HIGH (ref 80.0–100.0)
Platelets: 174 10*3/uL (ref 150–400)
RBC: 3.02 MIL/uL — ABNORMAL LOW (ref 4.22–5.81)
RDW: 14.3 % (ref 11.5–15.5)
WBC: 9.4 10*3/uL (ref 4.0–10.5)
nRBC: 0 % (ref 0.0–0.2)

## 2022-06-01 MED ORDER — DARBEPOETIN ALFA 100 MCG/0.5ML IJ SOSY
100.0000 ug | PREFILLED_SYRINGE | INTRAMUSCULAR | Status: DC
  Administered 2022-06-02 – 2022-06-09 (×2): 100 ug via SUBCUTANEOUS
  Filled 2022-06-01 (×2): qty 0.5

## 2022-06-01 MED ORDER — CARVEDILOL 3.125 MG PO TABS
3.1250 mg | ORAL_TABLET | ORAL | Status: DC
  Administered 2022-06-06 – 2022-06-07 (×4): 3.125 mg via ORAL
  Filled 2022-06-01 (×4): qty 1

## 2022-06-01 MED ORDER — PROSOURCE PLUS PO LIQD
30.0000 mL | Freq: Two times a day (BID) | ORAL | Status: DC
  Administered 2022-06-01 – 2022-06-09 (×13): 30 mL via ORAL
  Filled 2022-06-01 (×12): qty 30

## 2022-06-01 MED ORDER — CARVEDILOL 3.125 MG PO TABS
3.1250 mg | ORAL_TABLET | ORAL | Status: AC
  Administered 2022-06-02: 3.125 mg via ORAL
  Filled 2022-06-01: qty 1

## 2022-06-01 MED ORDER — CARVEDILOL 3.125 MG PO TABS
3.1250 mg | ORAL_TABLET | ORAL | Status: DC
  Administered 2022-06-05: 3.125 mg via ORAL
  Filled 2022-06-01 (×2): qty 1

## 2022-06-01 MED ORDER — MIDODRINE HCL 5 MG PO TABS
10.0000 mg | ORAL_TABLET | Freq: Once | ORAL | Status: DC
  Filled 2022-06-01: qty 2

## 2022-06-01 MED ORDER — CARVEDILOL 3.125 MG PO TABS
3.1250 mg | ORAL_TABLET | ORAL | Status: AC
  Administered 2022-06-04: 3.125 mg via ORAL
  Filled 2022-06-01: qty 1

## 2022-06-01 NOTE — Evaluation (Signed)
Physical Therapy Evaluation Patient Details Name: Rodney Farley MRN: [id number] DOB: [birth year] Today's Date: 06/01/2022  History of Present Illness  Desman slipped on some papers in his apartment and fell to the floor, sustaining a L ankle fx, NWB; Plan for ORIF 10/21; PMH of extensive cardiac history including CAD, NSTEMI, multiple PCI's including most recently on 05/07/2022 when he had PCI with balloon angioplasty for recurrent in-stent stenosis of LAD, ischemic cardiomyopathy, chronic systolic CHF with most recent LVEF of 45-50% on 11/11/2021, complete heart block s/p PPM which was upgraded to ICD-CRT in 2017, subclinical atrial fibrillation not on anticoagulation, moderate aortic stenosis, chronic hypotension on midodrine, HLD, DM 2, ESRD on HD MWF, OSA, COPD, TIA presented to the ED on 05/29/2022 following a mechanical fall at home the night prior followed by severe pain in his left ankle.  Diagnosed with left ankle fracture.  Orthopedics consulted and plan surgical intervention on 11/21 at noon.  Cardiology consulted and provided preop clearance.  Clinical Impression   Pt admitted with above diagnosis. Lives at home alone, in a single-level home with no steps to enter; Prior to admission, pt was managing independently, using cane/RW for amb, still driving, including to and from HD; Presents to PT with exquisite L lower leg and ankle pain with any movement;  Unable to participate in any functional mobility today due to L lower leg pain; Taught pt incentive spirometry; Anticipate better activity tolerance postop, but we will need a solid plan to address pain and maximize functional mobility; Recommend SNF for post-acute rehab; Pt currently with functional limitations due to the deficits listed below (see PT Problem List). Pt will benefit from skilled PT to increase their independence and safety with mobility to allow discharge to the venue listed below.          Recommendations for follow up  therapy are one component of a multi-disciplinary discharge planning process, led by the attending physician.  Recommendations may be updated based on patient status, additional functional criteria and insurance authorization.  Follow Up Recommendations Skilled nursing-short term rehab (<3 hours/day) Can patient physically be transported by private vehicle: No    Assistance Recommended at Discharge Frequent or constant Supervision/Assistance  Patient can return home with the following       Equipment Recommendations Other (comment) (pretty well-equipped; will revisit DME recs postop)  Recommendations for Other Services  OT consult    Functional Status Assessment Patient has had a recent decline in their functional status and demonstrates the ability to make significant improvements in function in a reasonable and predictable amount of time.     Precautions / Restrictions Precautions Precautions: Fall Precaution Comments: Premedicate for pain Restrictions LLE Weight Bearing: Non weight bearing      Mobility  Bed Mobility               General bed mobility comments: Able to use UEs for face wash and to hold incentive spirometer supine in bed; Unable to further particiapte in bed mobility due to pain    Transfers                   General transfer comment: Limited by exquisite pain LLE with any motion    Ambulation/Gait                  Stairs            Wheelchair Mobility    Modified Rankin (Stroke Patients Only)  Balance                                             Pertinent Vitals/Pain Pain Assessment Pain Assessment: 0-10 Pain Score: 10-Worst pain ever Pain Location: L ankle with any motion Pain Descriptors / Indicators: Crying Pain Intervention(s): Monitored during session, Repositioned, Limited activity within patient's tolerance, Patient requesting pain meds-RN notified    Home Living Family/patient  expects to be discharged to:: Private residence Living Arrangements: Alone Available Help at Discharge: Family;Available PRN/intermittently Type of Home: Apartment Home Access: Level entry       Home Layout: One level Home Equipment: Conservation officer, nature (2 wheels);Rollator (4 wheels);Shower seat;Grab bars - tub/shower      Prior Function Prior Level of Function : Independent/Modified Independent;History of Falls (last six months)             Mobility Comments: Uses rolling walker or rollator when out. Mostly furniture walker inside ADLs Comments: Reports Mod I for ADLs, basic IADLs. sponge bathes on bad days but typically able to get in shower and use shower chair     Hand Dominance   Dominant Hand: Right    Extremity/Trunk Assessment   Upper Extremity Assessment Upper Extremity Assessment: Overall WFL for tasks assessed (for simple ADLs supine in bed, and for holding incentive spirometer)    Lower Extremity Assessment Lower Extremity Assessment: Generalized weakness;LLE deficits/detail LLE Deficits / Details: Ankle immobilized in splint; positive active toe flex/extend; Unable to further test ankle and knee due to exquisite pain as we repostioned lower leg on a pillow LLE: Unable to fully assess due to pain LLE Sensation: history of peripheral neuropathy       Communication   Communication: HOH  Cognition Arousal/Alertness: Awake/alert Behavior During Therapy: WFL for tasks assessed/performed Overall Cognitive Status: Within Functional Limits for tasks assessed                                 General Comments: VERY anxious with anticipation of pain        General Comments General comments (skin integrity, edema, etc.): Took extra time to discuss PT goals with pt, ask about his typical daily experience with mobility and ADLs; Emphasized to pt that we would stop moving when he needs to; Provided pt with warm washcloth for face, neck, and chest washing;  provided with clean gown; offered to assist in getting paper scrub pants off, and pt accepted; as we moved LEs to accomplish this, it became clear that he would not tolerate any LE motion due to pain; HR 80, incr RR; taught pt incentive spiromentery    Exercises Other Exercises Other Exercises: incentive spriometry x 5 inhalations; pt to perform next 5 independently; Recommend pt perform 10 inhahations every waking hour   Assessment/Plan    PT Assessment Patient needs continued PT services  PT Problem List Decreased strength;Decreased range of motion;Decreased activity tolerance;Decreased balance;Decreased mobility;Decreased knowledge of use of DME;Decreased knowledge of precautions;Impaired sensation;Pain       PT Treatment Interventions DME instruction;Gait training;Functional mobility training;Therapeutic activities;Therapeutic exercise;Balance training;Patient/family education;Wheelchair mobility training    PT Goals (Current goals can be found in the Care Plan section)  Acute Rehab PT Goals Patient Stated Goal: No pain PT Goal Formulation: With patient Time For Goal Achievement: 06/15/22 Potential to Achieve Goals: Fair  Frequency Min 2X/week     Co-evaluation               AM-PAC PT "6 Clicks" Mobility  Outcome Measure Help needed turning from your back to your side while in a flat bed without using bedrails?: Total Help needed moving from lying on your back to sitting on the side of a flat bed without using bedrails?: Total Help needed moving to and from a bed to a chair (including a wheelchair)?: Total Help needed standing up from a chair using your arms (e.g., wheelchair or bedside chair)?: Total Help needed to walk in hospital room?: Total Help needed climbing 3-5 steps with a railing? : Total 6 Click Score: 6    End of Session   Activity Tolerance: Patient limited by pain Patient left: in bed;with call bell/phone within reach;with bed alarm set Nurse  Communication: Mobility status;Patient requests pain meds PT Visit Diagnosis: Other abnormalities of gait and mobility (R26.89);Pain Pain - Right/Left: Left Pain - part of body: Leg;Ankle and joints of foot    Time: 4540-9811 PT Time Calculation (min) (ACUTE ONLY): 47 min   Charges:   PT Evaluation $PT Eval Moderate Complexity: 1 Mod PT Treatments $Therapeutic Activity: 23-37 mins        [name], PT  Acute Rehabilitation Services Office [phone]   [place] 06/01/2022, 6:23 PM

## 2022-06-01 NOTE — TOC Initial Note (Signed)
Transition of Care [facility]) - Initial/Assessment Note    Patient Details  Name: Rodney Farley MRN: [id number] Date of Birth: Aug 15, 1931  Transition of Care [facility]) CM/SW Contact:    Tom-Johnson, Renea Ee, RN Phone Number: 06/01/2022, 3:30 PM  Clinical Narrative:                  Patient is admitted for Closed Left Ankle Fracture. Tentative ORIF scheduled for tomorrow 06/02/22. Patient has an extensive Cardiac hx and has been cleared for surgery by Cardiology. Has a Pacemaker and ICD. Has Dialysis on MWF schedule an drive self.  From home alone. Has one son. Independent with care prior to admission. Has all necessary DME's at home.   PCP is [name], MD and uses [employer] for his Binders, [place] [facility] [address] and also [employer].  Active with [facility] for home health PT/OT disciplines. [place] notified of admission and will follow.  CM will continue to follow as patient progresses with care.     Barriers to Discharge: Continued Medical Work up   Patient Goals and CMS Choice Patient states their goals for this hospitalization and ongoing recovery are:: To return home CMS Medicare.gov Compare Post Acute Care list provided to:: Patient    Expected Discharge Plan and Services     Discharge Planning Services: CM Consult Post Acute Care Choice: Resumption of Svcs/PTA Provider ([place]) Living arrangements for the past 2 months: Single Family Home                                      Prior Living Arrangements/Services Living arrangements for the past 2 months: Single Family Home Lives with:: Self Patient language and need for interpreter reviewed:: Yes Do you feel safe going back to the place where you live?: Yes      Need for Family Participation in Patient Care: Yes (Comment) Care giver support system in place?: Yes (comment) Current home services: DME (All necessary DME's) Criminal Activity/Legal Involvement Pertinent to  Current Situation/Hospitalization: No - Comment as needed  Activities of Daily Living Home Assistive Devices/Equipment: Eyeglasses, Hearing aid ADL Screening (condition at time of admission) Patient's cognitive ability adequate to safely complete daily activities?: Yes Is the patient deaf or have difficulty hearing?: Yes Does the patient have difficulty seeing, even when wearing glasses/contacts?: Yes Does the patient have difficulty concentrating, remembering, or making decisions?: No Patient able to express need for assistance with ADLs?: Yes Does the patient have difficulty dressing or bathing?: Yes Independently performs ADLs?: Yes (appropriate for developmental age) Does the patient have difficulty walking or climbing stairs?: Yes Weakness of Legs: Both Weakness of Arms/Hands: None  Permission Sought/Granted Permission sought to share information with : Case Manager, Customer service manager, Family Supports Permission granted to share information with : Yes, Verbal Permission Granted              Emotional Assessment Appearance:: Appears stated age Attitude/Demeanor/Rapport: Engaged, Gracious Affect (typically observed): Accepting, Calm, Hopeful, Pleasant Orientation: : Oriented to Self, Oriented to Place, Oriented to  Time, Oriented to Situation Alcohol / Substance Use: Not Applicable Psych Involvement: No (comment)  Admission diagnosis:  ESRD on dialysis ([place]) [N18.6, Z99.2] Closed left ankle fracture [S82.892A] Closed fracture of left ankle, initial encounter [S82.892A] Accidental fall, initial encounter [W19.XXXA] Patient Active Problem List   Diagnosis Date Noted   Closed left ankle fracture 05/29/2022   Accidental  fall 05/29/2022   S/P coronary artery stent placement 05/29/2022   Tremor 14/97/0263   Chronic systolic CHF (congestive heart failure) ([place]) 01/12/2022   Aortic stenosis 12/24/2021   Hypotension 12/24/2021   History of TIA (transient ischemic  attack)    Atrial fibrillation ([place]) - SCAF 08/26/2021   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 07/29/2020   Thrombocytopenia ([place]) 04/13/2019   Hypokalemia 04/12/2019   Anaphylactic shock, unspecified, initial encounter 04/10/2019   Anemia in chronic kidney disease 04/10/2019   Iron deficiency anemia, unspecified 04/10/2019   Secondary hyperparathyroidism of renal origin ([place]) 04/10/2019   Hyperlipidemia 04/13/2018   S/P cholecystectomy 10/13/2017   Diarrhea 05/11/2017   Marital stress 02/18/2017   BPH associated with nocturia 04/27/2016   Diabetic peripheral neuropathy (HCC)    COPD (chronic obstructive pulmonary disease) ([place])    Bell's palsy    Gout 06/26/2015   Diverticulitis 04/08/2015   DOE (dyspnea on exertion), due to significant CAD 03/28/2015   ESRD on hemodialysis ([place]) 03/04/2015   ESRD on dialysis ([place]) 03/04/2015   CAD in native artery 09/13/2014   GERD (gastroesophageal reflux disease) 08/07/2012   Major depression, recurrent, full remission ([place]) 08/05/2012   Sleep apnea 08/05/2012   Type II diabetes mellitus ([place]) 08/05/2012   Essential hypertension 08/05/2012   Depression 08/05/2012   Ischemic cardiomyopathy 11/03/2011   Atrioventricular block, complete (HCC)    Obesity (BMI 30-39.9)    Pacemaker    PCP:  [name], MD Pharmacy:   [facility], [address] [place] [address] Phone: [phone] Fax: [facility] [address] [place] [address] Phone: [phone] Fax: [phone]     Social Determinants of Health (SDOH) Interventions    Readmission Risk Interventions    05/08/2022    4:03 PM 04/02/2022    3:35 PM  Readmission Risk Prevention Plan  Transportation Screening Complete Complete  Medication Review ([place]) Complete Complete  PCP or Specialist appointment within 3-5 days  of discharge  Complete  HRI or [place] Complete Complete  SW Recovery Care/Counseling Consult Complete Complete  Palliative Care Screening Not Applicable Not [place] Not Applicable Not Applicable

## 2022-06-01 NOTE — Progress Notes (Signed)
PROGRESS NOTE   Male Rodney Farley  SAY:[id number]    DOB: Mar 11, 1932    DOA: 05/28/2022  PCP: [name], MD   I have briefly reviewed patients previous medical records in [facility].  Chief Complaint  Patient presents with   Leg Injury    Brief Narrative:  88 year old male, lives alone, ambulates with the help of a cane or a walker, PMH of extensive cardiac history including CAD, NSTEMI, multiple PCI's including most recently on 05/07/2022 when he had PCI with balloon angioplasty for recurrent in-stent stenosis of LAD, ischemic cardiomyopathy, chronic systolic CHF with most recent LVEF of 45-50% on 11/11/2021, complete heart block s/p PPM which was upgraded to ICD-CRT in 2017, subclinical atrial fibrillation not on anticoagulation, moderate aortic stenosis, chronic hypotension on midodrine, HLD, DM 2, ESRD on HD MWF, OSA, COPD, TIA presented to the ED on 05/29/2022 following a mechanical fall at home the night prior followed by severe pain in his left ankle.  Diagnosed with left ankle fracture.  Orthopedics consulted and plan surgical intervention on 11/21 at noon.  Cardiology consulted and provided preop clearance.  Assessment & Plan:  Principal Problem:   Closed left ankle fracture Active Problems:   Accidental fall   Hypotension   ESRD on hemodialysis (HCC)   Ischemic cardiomyopathy   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   Chronic systolic CHF (congestive heart failure) (HCC)   CAD in native artery   Anemia in chronic kidney disease   Atrioventricular block, complete (HCC)   Pacemaker   Type II diabetes mellitus ([place])   Hyperlipidemia   Obesity (BMI 30-39.9)   Sleep apnea   Thrombocytopenia (HCC)   Essential hypertension   ESRD on dialysis ([place])   S/P coronary artery stent placement   Left ankle fracture: Sustained s/p mechanical fall after he slipped on some paper at home.  Initial left ankle x-ray: Acute ankle fracture subluxation of  the left ankle.  Follow-up left ankle x-ray: Interval closed reduction of left ankle fracture subluxation.  Orthopedics consultation appreciated, NWB LLE, plan ORIF 11/20.  Please refer to cardiology consultation note from 11/17 for preop clearance.  They indicate that he is a high surgical risk given his multiple comorbidities.  Holding P2 Y12 inhibitors will be very high risk given his recurrent in-stent restenosis of this region and his recent procedures.  They do not recommend any further cardiac testing or optimization for surgery.  They prefer conservative measures if possible.  If surgery required and ticagrelor needs to be held, they recommend transitioning to cangrelor perioperatively.  Patient indicates no pain while nonweightbearing but volunteers to pain on minimal movement.  Multimodality pain control.  As per RN, patient scheduled for surgery on 11/21 at noon.  As per nephrology follow-up, will need dialysis prior to surgery and they will organize.  Chronic hypotension: Clinically not volume overloaded.  Gets midodrine pre-HD 3 times a week.   ESRD on HD MWF/mild hyperkalemia: Nephrology consulted.  Underwent of scheduled HD on 11/18 and 11/19 as per Thanksgiving holiday schedule.  However since surgery now happening on 11/21, they plan to dialyze him preop.   Chronic systolic CHF ischemic cardiomyopathy status post AICD Clinically euvolemic.  Volume management across HD.  Last echocardiogram noted EF to be 45 -50% with indeterminate diastolic parameters.     CAD, NSTEMI, multiple PCI's Extensive cardiac history as noted above.  Currently without anginal symptoms.  Most recent angioplasty and PCI of LAD in-stent stenosis on 05/07/2022.  Continue aspirin, Brilinta, and statin.  Discussed with Dr. [name], no new recommendations.   Anemia of chronic kidney disease/ESRD Patient is on vitamin B12 supplementation and EPO shots with dialysis, to be continued.  Stable.   Complete heart block s/p  permanent pacemaker AV paced on telemetry.   Diabetes mellitus type II, without long-term use of insulin A1c 5.9 suggests tight control.  Not on SSI.   Hyperlipidemia Goal LDL less than 50. Continue atorvastatin and Zetia. Resume Repatha in outpatient setting   Thrombocytopenia Resolved today/170.   OSA Patient reports that he does not use CPAP at night.  Body mass index is 33.6 kg/m./Obesity   DVT prophylaxis: heparin injection 5,000 Units Start: 05/29/22 0915     Code Status: Full Code:  Family Communication: None at bedside Disposition:  Status is: Inpatient Remains inpatient appropriate because: Need for surgical intervention for left ankle fracture, scheduled for 11/21 and then will likely need SNF.     Consultants:   Orthopedics Cardiology Nephrology  Procedures:   HD  Antimicrobials:      Subjective:  Started speaking to me in [place]!  Ongoing left ankle pain with minimal movement but no pain if he holds the ankle still.  No chest pain or dyspnea.  He thought he was going to have the surgery today.  Objective:   Vitals:   05/31/22 1827 05/31/22 2110 06/01/22 0534 06/01/22 0928  BP: (!) 139/44 (!) 109/54 (!) 135/55 (!) 115/53  Pulse: 80 80 66 80  Resp: 16 15 16 17   Temp: 98 F (36.7 C) 99.3 F (37.4 C) 98.4 F (36.9 C) 98.3 F (36.8 C)  TempSrc: Oral Oral    SpO2: 99% 94% 97% 96%  Weight:      Height:        General exam: Elderly male, moderately built and obese lying comfortably supine in bed without distress. Respiratory system: Clear to auscultation.  No increased work of breathing. Cardiovascular system: S1 and S2 heard, RRR.  No JVD or pedal edema.  2/6 systolic murmur at apex.  Left upper chest PPM box.  Telemetry personally reviewed: AV paced rhythm. Gastrointestinal system: Abdomen is nondistended, soft and nontender. No organomegaly or masses felt. Normal bowel sounds heard.  Long midline laparotomy scar. Central nervous system: Alert  and oriented. No focal neurological deficits. Extremities: Symmetric 5 x 5 power except left ankle due to pain and currently has crepe bandage around.. Skin: No rashes, lesions or ulcers Psychiatry: Judgement and insight appear normal. Mood & affect pleasant and appropriate.     Data Reviewed:   I have personally reviewed following labs and imaging studies   CBC: Recent Labs  Lab 05/28/22 2357 05/30/22 0219 06/01/22 0248  WBC 5.0 6.1 9.4  NEUTROABS 2.8  --   --   HGB 11.7* 9.3* 10.3*  HCT 35.7* 27.3* 31.3*  MCV 106.3* 103.8* 103.6*  PLT 120* 120* 809    Basic Metabolic Panel: Recent Labs  Lab 05/28/22 2357 05/30/22 0219 05/31/22 0250 06/01/22 0248  NA 139 138 135 137  K 5.0 5.3* 4.8 4.3  CL 96* 96* 92* 96*  CO2 27 30 30 29   GLUCOSE 124* 99 156* 101*  BUN 23 35* 22 18  CREATININE 6.90* 8.80* 6.28* 5.00*  CALCIUM 9.3 8.6* 8.7* 9.3  PHOS 4.3 5.2* 4.0 4.4    Liver Function Tests: Recent Labs  Lab 05/30/22 0219 05/31/22 0250 06/01/22 0248  ALBUMIN 2.9* 3.0* 3.1*    CBG: No results for input(s): "  GLUCAP" in the last 168 hours.  Microbiology Studies:  No results found for this or any previous visit (from the past 240 hour(s)).  Radiology Studies:  No results found.  Scheduled Meds:    (feeding supplement) PROSource Plus  30 mL Oral BID BM   acidophilus  1 capsule Oral Daily   aspirin EC  81 mg Oral Daily   atorvastatin  80 mg Oral QHS   calcitRIOL  1.75 mcg Oral Q M,W,F-HD   carvedilol  3.125 mg Oral 2 times per day on Sun Tue Thu Sat   carvedilol  3.125 mg Oral Once per day on Mon Wed Fri   Chlorhexidine Gluconate Cloth  6 each Topical Q0600   cyanocobalamin  1,000 mcg Oral Daily   [START ON 06/02/2022] darbepoetin (ARANESP) injection - DIALYSIS  100 mcg Subcutaneous Q Tue-1800   ezetimibe  10 mg Oral Daily   gabapentin  100 mg Oral BID   Followed by   Derrill Memo ON 06/05/2022] gabapentin  100 mg Oral QHS   heparin  5,000 Units Subcutaneous Q8H    isosorbide mononitrate  15 mg Oral Daily   melatonin  3 mg Oral QHS   midodrine  10 mg Oral Q M,W,F   midodrine  10 mg Oral Once   multivitamin  1 tablet Oral Daily   pantoprazole  40 mg Oral Daily   senna  1 tablet Oral QHS   sevelamer carbonate  1,600 mg Oral With snacks   sevelamer carbonate  2,400 mg Oral BID WC   ticagrelor  90 mg Oral BID    Continuous Infusions:     LOS: 3 days     Vernell Leep, MD,  FACP, FHM, SFHM, [facility], [place]     To contact the attending provider between 7A-7P or the covering provider during after hours 7P-7A, please log into the web site [email or web address] and access using universal [place] password for that web site. If you do not have the password, please call the hospital operator.  06/01/2022, 12:08 PM

## 2022-06-01 NOTE — Care Management Important Message (Signed)
Important Message  Patient Details  Name: Rodney Farley MRN: [id number] Date of Birth: September 15, 1931   Medicare Important Message Given:  Yes     Arlyn Buerkle [address] 06/01/2022, 3:54 PM

## 2022-06-01 NOTE — Progress Notes (Signed)
[facility] Progress Note   Subjective:  Seen in room - says feeling well today. Denies CP or dyspnea. I had though he was going for surgery today, but now appears that it was moved to Tuesday 11/21 - he is also scheduled for dialysis tomorrow, will work around the surgery.   Objective Vitals:   05/31/22 1827 05/31/22 2110 06/01/22 0534 06/01/22 0928  BP: (!) 139/44 (!) 109/54 (!) 135/55 (!) 115/53  Pulse: 80 80 66 80  Resp: 16 15 16 17   Temp: 98 F (36.7 C) 99.3 F (37.4 C) 98.4 F (36.9 C) 98.3 F (36.8 C)  TempSrc: Oral Oral    SpO2: 99% 94% 97% 96%  Weight:      Height:       Physical Exam General: Elderly man, NAD. Room air. Heart: RRR; 2/6 murmur Lungs: CTA anteriorly Abdomen: soft, non-tender Extremities: L ankle in large ACE wrap, no RLE edema Dialysis Access: RUE AVG + thrill/bruit, + bruising  Additional Objective Labs: Basic Metabolic Panel: Recent Labs  Lab 05/30/22 0219 05/31/22 0250 06/01/22 0248  NA 138 135 137  K 5.3* 4.8 4.3  CL 96* 92* 96*  CO2 30 30 29   GLUCOSE 99 156* 101*  BUN 35* 22 18  CREATININE 8.80* 6.28* 5.00*  CALCIUM 8.6* 8.7* 9.3  PHOS 5.2* 4.0 4.4   Liver Function Tests: Recent Labs  Lab 05/30/22 0219 05/31/22 0250 06/01/22 0248  ALBUMIN 2.9* 3.0* 3.1*   CBC: Recent Labs  Lab 05/28/22 2357 05/30/22 0219 06/01/22 0248  WBC 5.0 6.1 9.4  NEUTROABS 2.8  --   --   HGB 11.7* 9.3* 10.3*  HCT 35.7* 27.3* 31.3*  MCV 106.3* 103.8* 103.6*  PLT 120* 120* 174   Medications:   acidophilus  1 capsule Oral Daily   aspirin EC  81 mg Oral Daily   atorvastatin  80 mg Oral QHS   calcitRIOL  1.75 mcg Oral Q M,W,F-HD   carvedilol  3.125 mg Oral 2 times per day on Sun Tue Thu Sat   carvedilol  3.125 mg Oral Once per day on Mon Wed Fri   Chlorhexidine Gluconate Cloth  6 each Topical Q0600   cyanocobalamin  1,000 mcg Oral Daily   ezetimibe  10 mg Oral Daily   gabapentin  100 mg Oral BID   Followed by   Derrill Memo ON  06/05/2022] gabapentin  100 mg Oral QHS   heparin  5,000 Units Subcutaneous Q8H   isosorbide mononitrate  15 mg Oral Daily   melatonin  3 mg Oral QHS   midodrine  10 mg Oral Q M,W,F   midodrine  10 mg Oral Once   multivitamin  1 tablet Oral Daily   pantoprazole  40 mg Oral Daily   senna  1 tablet Oral QHS   sevelamer carbonate  1,600 mg Oral With snacks   sevelamer carbonate  2,400 mg Oral BID WC   ticagrelor  90 mg Oral BID    Dialysis Orders: MWF NW  4h   400/500  94kg  2/2 bath  Hep 2500+ 1065midrun  RUA AVG - last HD 11/15, post 95.4kg - calcitriol 1.75 ug po tiw - mircera 21mcg q4, last 11/6   Assessment/Plan: L ankle fracture: Looks like surgery was moved to 11/21 - will need dialysis prior to surgery.  ESRD: Usual MWF schedule - following Thanksgiving Holiday schedule this week: Sun/Tues/Fri. Next HD tomorrow AM (11/21) - will dialyze prior to his ankle surgery.  Hypertension/volume: On  midodrine pre-HD (as well as BB/iso). Up per weights, last cast likely playing a role, he does not appear overloaded at this time.   HFrEF - EF 45-50% CAD/multiple PCI/stent CHB s/p PPM Anemia of ESRD: Hgb 10.3 - will give ESA with next HD. Secondary HPTH: Ca/Phos ok. Continue binders + VDRA DM2 - on insulin, per pmd Nutrition - Renal diet w/fluid restrictions, adding supplements.    Veneta Penton, PA-C 06/01/2022, 10:23 AM  [facility]

## 2022-06-02 ENCOUNTER — Encounter (HOSPITAL_COMMUNITY)
Admission: EM | Disposition: A | Discharge status: Skilled Nursing Facility | Payer: Self-pay | Source: Home / Self Care | Attending: Internal Medicine

## 2022-06-02 ENCOUNTER — Inpatient Hospital Stay (HOSPITAL_COMMUNITY): Payer: PPO

## 2022-06-02 ENCOUNTER — Encounter (HOSPITAL_COMMUNITY): Payer: Self-pay | Admitting: Internal Medicine

## 2022-06-02 ENCOUNTER — Other Ambulatory Visit: Payer: Self-pay

## 2022-06-02 ENCOUNTER — Ambulatory Visit: Payer: PPO | Admitting: Podiatry

## 2022-06-02 ENCOUNTER — Inpatient Hospital Stay (HOSPITAL_COMMUNITY): Payer: PPO | Admitting: Certified Registered"

## 2022-06-02 DIAGNOSIS — Z87891 Personal history of nicotine dependence: Secondary | ICD-10-CM

## 2022-06-02 DIAGNOSIS — G4730 Sleep apnea, unspecified: Secondary | ICD-10-CM | POA: Diagnosis not present

## 2022-06-02 DIAGNOSIS — S82892D Other fracture of left lower leg, subsequent encounter for closed fracture with routine healing: Secondary | ICD-10-CM | POA: Diagnosis not present

## 2022-06-02 DIAGNOSIS — E875 Hyperkalemia: Secondary | ICD-10-CM | POA: Diagnosis not present

## 2022-06-02 DIAGNOSIS — J449 Chronic obstructive pulmonary disease, unspecified: Secondary | ICD-10-CM

## 2022-06-02 DIAGNOSIS — Z992 Dependence on renal dialysis: Secondary | ICD-10-CM | POA: Diagnosis not present

## 2022-06-02 DIAGNOSIS — N186 End stage renal disease: Secondary | ICD-10-CM | POA: Diagnosis not present

## 2022-06-02 HISTORY — PX: ORIF ANKLE FRACTURE: SHX5408

## 2022-06-02 LAB — RENAL FUNCTION PANEL
Albumin: 2.9 g/dL — ABNORMAL LOW (ref 3.5–5.0)
Anion gap: 19 — ABNORMAL HIGH (ref 5–15)
BUN: 39 mg/dL — ABNORMAL HIGH (ref 8–23)
CO2: 27 mmol/L (ref 22–32)
Calcium: 9.4 mg/dL (ref 8.9–10.3)
Chloride: 90 mmol/L — ABNORMAL LOW (ref 98–111)
Creatinine, Ser: 7.37 mg/dL — ABNORMAL HIGH (ref 0.61–1.24)
GFR, Estimated: 7 mL/min — ABNORMAL LOW (ref >60)
Glucose, Bld: 104 mg/dL — ABNORMAL HIGH (ref 70–99)
Phosphorus: 5.1 mg/dL — ABNORMAL HIGH (ref 2.5–4.6)
Potassium: 4.6 mmol/L (ref 3.5–5.1)
Sodium: 136 mmol/L (ref 135–145)

## 2022-06-02 LAB — GLUCOSE, CAPILLARY: Glucose-Capillary: 115 mg/dL — ABNORMAL HIGH (ref 70–99)

## 2022-06-02 LAB — POCT I-STAT, CHEM 8
BUN: 19 mg/dL (ref 8–23)
Calcium, Ion: 0.97 mmol/L — ABNORMAL LOW (ref 1.15–1.40)
Chloride: 93 mmol/L — ABNORMAL LOW (ref 98–111)
Creatinine, Ser: 4.3 mg/dL — ABNORMAL HIGH (ref 0.61–1.24)
Glucose, Bld: 114 mg/dL — ABNORMAL HIGH (ref 70–99)
HCT: 35 % — ABNORMAL LOW (ref 39.0–52.0)
Hemoglobin: 11.9 g/dL — ABNORMAL LOW (ref 13.0–17.0)
Potassium: 4.2 mmol/L (ref 3.5–5.1)
Sodium: 134 mmol/L — ABNORMAL LOW (ref 135–145)
TCO2: 29 mmol/L (ref 22–32)

## 2022-06-02 SURGERY — OPEN REDUCTION INTERNAL FIXATION (ORIF) ANKLE FRACTURE
Anesthesia: Monitor Anesthesia Care | Site: Ankle | Laterality: Left

## 2022-06-02 MED ORDER — ACETAMINOPHEN 500 MG PO TABS
1000.0000 mg | ORAL_TABLET | Freq: Once | ORAL | Status: AC
  Administered 2022-06-02: 1000 mg via ORAL

## 2022-06-02 MED ORDER — CEFAZOLIN SODIUM-DEXTROSE 2-4 GM/100ML-% IV SOLN
2.0000 g | INTRAVENOUS | Status: AC
  Administered 2022-06-03: 2 g via INTRAVENOUS
  Filled 2022-06-02: qty 100

## 2022-06-02 MED ORDER — SODIUM CHLORIDE 0.9 % IR SOLN
Status: DC | PRN
  Administered 2022-06-02: 1000 mL

## 2022-06-02 MED ORDER — ORAL CARE MOUTH RINSE: 15.0000 mL | Freq: Once | OROMUCOSAL | Status: AC

## 2022-06-02 MED ORDER — TRANEXAMIC ACID 1000 MG/10ML IV SOLN
INTRAVENOUS | Status: DC | PRN
  Administered 2022-06-02: 1000 mg via INTRAVENOUS

## 2022-06-02 MED ORDER — ROPIVACAINE HCL 5 MG/ML IJ SOLN
INTRAMUSCULAR | Status: DC | PRN
  Administered 2022-06-02: 15 mL via PERINEURAL
  Administered 2022-06-02: 10 mL via PERINEURAL

## 2022-06-02 MED ORDER — INSULIN ASPART 100 UNIT/ML IJ SOLN: 0.0000 [IU] | INTRAMUSCULAR | Status: DC | PRN

## 2022-06-02 MED ORDER — ACETAMINOPHEN 500 MG PO TABS
ORAL_TABLET | ORAL | Status: AC
  Filled 2022-06-02: qty 2

## 2022-06-02 MED ORDER — TRANEXAMIC ACID-NACL 1000-0.7 MG/100ML-% IV SOLN
INTRAVENOUS | Status: AC
  Filled 2022-06-02: qty 100

## 2022-06-02 MED ORDER — ONDANSETRON HCL 4 MG PO TABS: 4.0000 mg | ORAL_TABLET | Freq: Four times a day (QID) | ORAL | Status: DC | PRN

## 2022-06-02 MED ORDER — FENTANYL CITRATE (PF) 250 MCG/5ML IJ SOLN
INTRAMUSCULAR | Status: DC | PRN
  Administered 2022-06-02: 50 ug via INTRAVENOUS

## 2022-06-02 MED ORDER — HYDROCODONE-ACETAMINOPHEN 5-325 MG PO TABS
1.0000 | ORAL_TABLET | ORAL | Status: DC | PRN
  Administered 2022-06-03: 2 via ORAL
  Administered 2022-06-04 – 2022-06-05 (×2): 1 via ORAL
  Administered 2022-06-07 (×2): 2 via ORAL
  Filled 2022-06-02: qty 1
  Filled 2022-06-02: qty 2
  Filled 2022-06-02: qty 1
  Filled 2022-06-02 (×2): qty 2

## 2022-06-02 MED ORDER — ACETAMINOPHEN 500 MG PO TABS
500.0000 mg | ORAL_TABLET | Freq: Four times a day (QID) | ORAL | Status: AC
  Administered 2022-06-02 – 2022-06-03 (×2): 500 mg via ORAL
  Filled 2022-06-02 (×3): qty 1

## 2022-06-02 MED ORDER — CHLORHEXIDINE GLUCONATE 0.12 % MT SOLN: 15.0000 mL | Freq: Once | OROMUCOSAL | Status: AC

## 2022-06-02 MED ORDER — FENTANYL CITRATE (PF) 100 MCG/2ML IJ SOLN: 25.0000 ug | INTRAMUSCULAR | Status: DC | PRN

## 2022-06-02 MED ORDER — PROPOFOL 10 MG/ML IV BOLUS
INTRAVENOUS | Status: DC | PRN
  Administered 2022-06-02: 40 mg via INTRAVENOUS

## 2022-06-02 MED ORDER — PHENYLEPHRINE 80 MCG/ML (10ML) SYRINGE FOR IV PUSH (FOR BLOOD PRESSURE SUPPORT)
PREFILLED_SYRINGE | INTRAVENOUS | Status: DC | PRN
  Administered 2022-06-02: 160 ug via INTRAVENOUS

## 2022-06-02 MED ORDER — ASPIRIN 81 MG PO TBEC
81.0000 mg | DELAYED_RELEASE_TABLET | Freq: Two times a day (BID) | ORAL | Status: DC
  Administered 2022-06-03 – 2022-06-08 (×12): 81 mg via ORAL
  Filled 2022-06-02 (×12): qty 1

## 2022-06-02 MED ORDER — CEFAZOLIN SODIUM-DEXTROSE 2-4 GM/100ML-% IV SOLN
INTRAVENOUS | Status: AC
  Filled 2022-06-02: qty 100

## 2022-06-02 MED ORDER — CEFAZOLIN SODIUM-DEXTROSE 2-3 GM-%(50ML) IV SOLR
INTRAVENOUS | Status: DC | PRN
  Administered 2022-06-02: 2 g via INTRAVENOUS

## 2022-06-02 MED ORDER — PHENYLEPHRINE HCL-NACL 20-0.9 MG/250ML-% IV SOLN
INTRAVENOUS | Status: DC | PRN
  Administered 2022-06-02: 40 ug/min via INTRAVENOUS

## 2022-06-02 MED ORDER — PROPOFOL 500 MG/50ML IV EMUL
INTRAVENOUS | Status: DC | PRN
  Administered 2022-06-02: 50 ug/kg/min via INTRAVENOUS

## 2022-06-02 MED ORDER — MEPIVACAINE HCL (PF) 2 % IJ SOLN
INTRAMUSCULAR | Status: DC | PRN
  Administered 2022-06-02: 10 mL
  Administered 2022-06-02: 15 mL

## 2022-06-02 MED ORDER — DOCUSATE SODIUM 100 MG PO CAPS
100.0000 mg | ORAL_CAPSULE | Freq: Two times a day (BID) | ORAL | Status: DC
  Administered 2022-06-02 – 2022-06-09 (×14): 100 mg via ORAL
  Filled 2022-06-02 (×14): qty 1

## 2022-06-02 MED ORDER — FENTANYL CITRATE (PF) 250 MCG/5ML IJ SOLN
INTRAMUSCULAR | Status: AC
  Filled 2022-06-02: qty 5

## 2022-06-02 MED ORDER — ONDANSETRON HCL 4 MG/2ML IJ SOLN
4.0000 mg | Freq: Four times a day (QID) | INTRAMUSCULAR | Status: DC | PRN
  Administered 2022-06-04: 4 mg via INTRAVENOUS
  Filled 2022-06-02: qty 2

## 2022-06-02 MED ORDER — MORPHINE SULFATE (PF) 2 MG/ML IV SOLN
0.5000 mg | INTRAVENOUS | Status: DC | PRN
  Administered 2022-06-03 (×3): 1 mg via INTRAVENOUS
  Filled 2022-06-02 (×4): qty 1

## 2022-06-02 MED ORDER — SODIUM CHLORIDE 0.9 % IV SOLN: INTRAVENOUS | Status: DC

## 2022-06-02 MED ORDER — CHLORHEXIDINE GLUCONATE 0.12 % MT SOLN
OROMUCOSAL | Status: AC
  Administered 2022-06-02: 15 mL via OROMUCOSAL
  Filled 2022-06-02: qty 15

## 2022-06-02 MED ORDER — LIDOCAINE 2% (20 MG/ML) 5 ML SYRINGE
INTRAMUSCULAR | Status: DC | PRN
  Administered 2022-06-02: 40 mg via INTRAVENOUS

## 2022-06-02 MED ORDER — ACETAMINOPHEN 325 MG PO TABS: 325.0000 mg | ORAL_TABLET | Freq: Four times a day (QID) | ORAL | Status: DC | PRN

## 2022-06-02 MED ORDER — HYDROCODONE-ACETAMINOPHEN 5-325 MG PO TABS: 1.0000 | ORAL_TABLET | Freq: Four times a day (QID) | ORAL | 0 refills | Status: DC | PRN

## 2022-06-02 MED ORDER — HYDROCODONE-ACETAMINOPHEN 7.5-325 MG PO TABS
1.0000 | ORAL_TABLET | ORAL | Status: DC | PRN
  Administered 2022-06-06: 1 via ORAL
  Administered 2022-06-08: 2 via ORAL
  Administered 2022-06-09: 1 via ORAL
  Filled 2022-06-02: qty 2
  Filled 2022-06-02 (×2): qty 1
  Filled 2022-06-02: qty 2

## 2022-06-02 SURGICAL SUPPLY — 47 items
BAG COUNTER SPONGE SURGICOUNT (BAG) IMPLANT
BIT DRILL 3.7 (BIT) IMPLANT
BNDG ELASTIC 4X5.8 VLCR STR LF (GAUZE/BANDAGES/DRESSINGS) ×1 IMPLANT
BNDG ELASTIC 6X10 VLCR STRL LF (GAUZE/BANDAGES/DRESSINGS) ×1 IMPLANT
CHLORAPREP W/TINT 26 (MISCELLANEOUS) ×1 IMPLANT
COTTON STERILE ROLL (GAUZE/BANDAGES/DRESSINGS) ×1 IMPLANT
CUFF TOURN SGL QUICK 34 (TOURNIQUET CUFF) ×1
CUFF TRNQT CYL 34X4.125X (TOURNIQUET CUFF) ×1 IMPLANT
DERMABOND ADVANCED .7 DNX12 (GAUZE/BANDAGES/DRESSINGS) IMPLANT
DRAPE C-ARM 42X120 X-RAY (DRAPES) ×1 IMPLANT
DRAPE C-ARM 42X72 X-RAY (DRAPES) ×1 IMPLANT
DRAPE C-ARMOR (DRAPES) ×1 IMPLANT
DRAPE U-SHAPE 47X51 STRL (DRAPES) ×1 IMPLANT
DRSG XEROFORM 1X8 (GAUZE/BANDAGES/DRESSINGS) ×1 IMPLANT
ELECT REM PT RETURN 15FT ADLT (MISCELLANEOUS) ×1 IMPLANT
FIBULOCK IMPLANT SYSTEM STER (Miscellaneous) ×1 IMPLANT
GAUZE PAD ABD 7.5X8 STRL (GAUZE/BANDAGES/DRESSINGS) IMPLANT
GAUZE PAD ABD 8X10 STRL (GAUZE/BANDAGES/DRESSINGS) ×1 IMPLANT
GAUZE SPONGE 4X4 12PLY STRL (GAUZE/BANDAGES/DRESSINGS) ×1 IMPLANT
GAUZE XEROFORM 1X8 LF (GAUZE/BANDAGES/DRESSINGS) IMPLANT
GLOVE BIO SURGEON STRL SZ8.5 (GLOVE) ×1 IMPLANT
GLOVE SRG 8 PF TXTR STRL LF DI (GLOVE) ×1 IMPLANT
GLOVE SURG ORTHO LTX SZ7.5 (GLOVE) ×1 IMPLANT
GLOVE SURG ORTHO LTX SZ8 (GLOVE) ×1 IMPLANT
GLOVE SURG UNDER POLY LF SZ8 (GLOVE) ×1
GOWN STRL REIN XL XLG (GOWN DISPOSABLE) ×1 IMPLANT
GOWN STRL REUS W/TWL XL LVL3 (GOWN DISPOSABLE) ×1 IMPLANT
IMPL TIGHTROP W/DRV K-LESS (Anchor) IMPLANT
IMPLANT TIGHTROPE W/DRV K-LESS (Anchor) ×1 IMPLANT
KIT INST FIBULOCK NL STL DISP (Miscellaneous) IMPLANT
KIT TURNOVER KIT A (KITS) IMPLANT
NAIL FIBULOCK 3.8X180 LT (Nail) IMPLANT
PACK ORTHO EXTREMITY (CUSTOM PROCEDURE TRAY) ×1 IMPLANT
PAD CAST 4YDX4 CTTN HI CHSV (CAST SUPPLIES) ×1 IMPLANT
PADDING CAST COTTON 4X4 STRL (CAST SUPPLIES) ×1
PADDING CAST COTTON 6X4 STRL (CAST SUPPLIES) IMPLANT
PROTECTOR NERVE ULNAR (MISCELLANEOUS) ×1 IMPLANT
REAMER CANN LNG 4.0 STRL (BIT) IMPLANT
REAMER LONG CANNULATED 4.0 S (BIT) ×1
SCREW CAN THREAD 3X18 (Screw) IMPLANT
SCREW CANCELLOUS 3MM 3X14MM (Screw) IMPLANT
STRIP CLOSURE SKIN 1/2X4 (GAUZE/BANDAGES/DRESSINGS) IMPLANT
SUT MNCRL AB 3-0 PS2 27 (SUTURE) ×1 IMPLANT
SUT MON AB 2-0 CT1 27 (SUTURE) ×1 IMPLANT
SUT MON AB 2-0 CT1 36 (SUTURE) ×1 IMPLANT
SUT PDS AB 0 CT 36 (SUTURE) ×1 IMPLANT
SYSTEM IMPLANT FIBULOCK STRL (Miscellaneous) ×1 IMPLANT

## 2022-06-02 NOTE — Progress Notes (Signed)
[facility] Progress Note   Subjective:  Seen on HD - feels ok. He had crumbs all over his chest - supposed to be NPO, but he reports that he ate breakfast this AM - secure text sent to hospitalist and ortho PA. Denies CP/dyspnea.  Objective Vitals:   06/01/22 2003 06/02/22 0602 06/02/22 0801 06/02/22 0813  BP: (!) 118/57 114/60 (!) 128/53 (!) 126/56  Pulse: 79 73 80 80  Resp: 16 17 (!) 21 17  Temp: 98.5 F (36.9 C) 98.7 F (37.1 C) 98.2 F (36.8 C)   TempSrc: Oral Oral Oral   SpO2: 94% 97% 93% 91%  Weight: 93.3 kg  92 kg   Height:       Physical Exam General: Elderly man, NAD. Room air. Heart: RRR; 2/6 murmur Lungs: CTA anteriorly Abdomen: soft, non-tender Extremities: L ankle in large ACE wrap, no RLE edema Dialysis Access: RUE AVG + thrill/bruit, + bruising  Additional Objective Labs: Basic Metabolic Panel: Recent Labs  Lab 05/31/22 0250 06/01/22 0248 06/02/22 0425  NA 135 137 136  K 4.8 4.3 4.6  CL 92* 96* 90*  CO2 30 29 27   GLUCOSE 156* 101* 104*  BUN 22 18 39*  CREATININE 6.28* 5.00* 7.37*  CALCIUM 8.7* 9.3 9.4  PHOS 4.0 4.4 5.1*   Liver Function Tests: Recent Labs  Lab 05/31/22 0250 06/01/22 0248 06/02/22 0425  ALBUMIN 3.0* 3.1* 2.9*   CBC: Recent Labs  Lab 05/28/22 2357 05/30/22 0219 06/01/22 0248  WBC 5.0 6.1 9.4  NEUTROABS 2.8  --   --   HGB 11.7* 9.3* 10.3*  HCT 35.7* 27.3* 31.3*  MCV 106.3* 103.8* 103.6*  PLT 120* 120* 174   Medications:   (feeding supplement) PROSource Plus  30 mL Oral BID BM   acidophilus  1 capsule Oral Daily   aspirin EC  81 mg Oral Daily   atorvastatin  80 mg Oral QHS   calcitRIOL  1.75 mcg Oral Q M,W,F-HD   [START ON 06/06/2022] carvedilol  3.125 mg Oral 2 times per day on Sun Tue Thu Sat   [START ON 06/05/2022] carvedilol  3.125 mg Oral Once per day on Mon Wed Fri   carvedilol  3.125 mg Oral 2 times per day on Mon Wed Thu   carvedilol  3.125 mg Oral Once per day on Tue   Chlorhexidine  Gluconate Cloth  6 each Topical Q0600   cyanocobalamin  1,000 mcg Oral Daily   darbepoetin (ARANESP) injection - DIALYSIS  100 mcg Subcutaneous Q Tue-1800   ezetimibe  10 mg Oral Daily   gabapentin  100 mg Oral BID   Followed by   Derrill Memo ON 06/05/2022] gabapentin  100 mg Oral QHS   heparin  5,000 Units Subcutaneous Q8H   isosorbide mononitrate  15 mg Oral Daily   melatonin  3 mg Oral QHS   midodrine  10 mg Oral Q M,W,F   midodrine  10 mg Oral Once in dialysis   multivitamin  1 tablet Oral Daily   pantoprazole  40 mg Oral Daily   senna  1 tablet Oral QHS   sevelamer carbonate  1,600 mg Oral With snacks   sevelamer carbonate  2,400 mg Oral BID WC   ticagrelor  90 mg Oral BID    Dialysis Orders: MWF NW  4h   400/500  94kg  2/2 bath  Hep 2500+ 1065midrun  RUA AVG - last HD 11/15, post 95.4kg - calcitriol 1.75 ug po tiw - mircera 42mcg  q4, last 11/6   Assessment/Plan: L ankle fracture: Looks like surgery was moved to 11/21 - ate this AM, surgery may need to be delayed. ESRD: Usual MWF schedule - following Thanksgiving Holiday schedule this week: Sun/Tues/Fri. HD now - 2L UFG. Hypertension/volume: On midodrine pre-HD (as well as BB/iso). He does not appear overloaded at this time.   HFrEF - EF 45-50% CAD/multiple PCI/stent CHB s/p PPM Anemia of ESRD: Hgb 10.3 - Aranesp 15mcg started q Tuesday while here. Secondary HPTH: Ca/Phos ok. Continue binders + VDRA DM2 - on insulin, per pmd Nutrition - Renal diet w/fluid restrictions, continue supplements.  Veneta Penton, PA-C 06/02/2022, 8:42 AM  [employer]

## 2022-06-02 NOTE — Op Note (Addendum)
OPERATIVE NOTE  Rodney Farley male 88 y.o. 06/02/2022  PREOPERATIVE DIAGNOSIS: Left bimalleolar ankle fracture Osteoporosis with left ankle fragility fracture  POSTOPERATIVE DIAGNOSIS: Left bimalleolar ankle fracture (K93.818E) Osteoporosis with left ankle fragility fracture (M80.072A) Left ankle syndesmotic disruption (X93.716R)  PROCEDURE(S): Open treatment of left bimalleolar ankle fracture with internal fixation of left fibula, with intramedullary implant (67893) Open treatment syndesmosis instability left ankle (81017) Operative use of fluoroscopy for above procedure(s) (51025)  Application short leg splint left lower extremity (85277)  SURGEON: Georgeanna Harrison, M.D.  ASSISTANT(S): Wandra Arthurs, RNFA  ANESTHESIA: General  FINDINGS: Preoperative Examination: Left Lower Extremity: Inspection: Well padding AO splint clean dry and intact Palpation: Very tender palpation site of known fracture ROM: Ankle range of motion limited due to pain, injury, and immobilization Strength: Able to flex and extend the toes Sensation: Baseline limited to light touch distally in all distributions setting baseline peripheral neuropathy Skin: Ashy, intact Peripheral Vascular: Warm and well-perfused distally  Operative Findings: Residual lateral subluxation of the talus in the mortise.  Anatomic reduction of the talus in the mortise, confirmed on orthogonal AP and lateral fluoroscopic views.  Appropriate intramedullary placement of fibular implant stabilizing lateral malleolus fracture.  Syndesmotic stabilization with tight rope fixation.  IMPLANTS: Implant Name Type Inv. Item Serial No. Manufacturer Lot No. LRB No. Used Action  FIBULOCK IMPLANT SYSTEM STER - [id number] Miscellaneous FIBULOCK IMPLANT SYSTEM STER  Elida [id number] Left 1 Implanted  [employer] FIBULOCK NAIL 3.8X180MM LEFT     [id number] Left 1 Implanted  3.0X18 CANCELLOUS SCREW    [employer]  Left 1 Implanted  IMPLANT  TIGHTROPE W/DRV K-LESS - [id number] Anchor IMPLANT TIGHTROPE W/DRV [employer] [id number] Left 1 Implanted  SCREW CANCELLOUS 3MM 3X14MM - [id number] Screw SCREW CANCELLOUS 3MM 3X14MM  [employer]  Left 1 Implanted    INDICATIONS:  The patient is a 88 y.o. male who sustained a mechanical trip and fall and injured his left ankle.  He was not able to bear weight after the injury.  Presented emergency room where is found to sustain a left ankle fracture dislocation.  Closed reduction was performed, there was residual lateral subluxation of the talus and displacement of the lateral malleolus fracture.  There was also concern for syndesmotic instability with medial clear space widening.  He was recommended to undergo open treatment of the bimalleolar fracture with fibular fixation and syndesmotic stabilization.  He understood the risks, benefits and alternatives to surgery which include but are not limited to bleeding, wound healing complications, infection, damage to surrounding structures, persistent pain, stiffness, lack of improvement, potential for subsequent arthritis or worsening of pre-existing arthritis, nonunion, malunion, and need for further surgery, as well as complications related to anesthesia, cardiovascular complications, and death.  He also understood the potential for continued pain, and that there were no guarantees of acceptable outcome.  After weighing these risks the patient opted to proceed with surgery.  TECHNIQUE: Patient was identified in the preoperative holding area.  The left ankle was confirmed as the appropriate operative site and marked by me.  Consent was signed by myself and the patient and witnessed by the preoperative nurse.  Adductor canal block was performed by anesthesia in the preoperative holding area.  Patient was taken to the operative suite and placed supine on the operative table.  Anesthesia was induced by the anesthesia team.  The patient was positioned  appropriately for the procedure and all bony prominences were well padded.  A non-sterile thigh  tourniquet was placed on the operative extremity.  Preoperative antibiotics were given. The extremity was prepped and draped in the usual sterile fashion and surgical timeout was performed.  Tourniquet was notified during the procedure.  Surface anatomy was marked out laterally.  The fibular fracture and talus were able to be reduced and realigned without open reduction, so this coupled with the fact that he has diabetes with diabetic peripheral neuropathy, I opted to proceed with intramedullary fixation of the fibula.  The guidewire was positioned at the distal fibular entry point under fluoroscopic guidance.  The trajectory was confirmed on 5 AP lateral fluoroscopic views.  Guidewire was advanced retrograde proximally into the fibula under fluoroscopic guidance on orthogonal views and across the fracture proximally into the intramedullary canal.  Skin was incised sharply at the entry point.  The distal fibula was cannulated with the anterior reamer.  A subsequent reaming was conducted over the guidewire to achieve appropriate cortical chatter.  The reamers and guidewire were withdrawn, and a 3.8 mm diameter fibular nail was impacted into position under fluoroscopic guidance.  Separate lateral incision was made on the fibula to accommodate the distal interlock screws and the syndesmotic fixation.  Skin was incised sharply with sharp dissection down the lateral fibular cortex.  2 distal interlock screws were placed through the jig through the nail under fluoroscopic guidance, taking care to avoid subchondral penetration medially.  Given syndesmotic instability radiographically, syndesmotic stabilization was performed with a tight rope suture fixation.  Path for the suture fixation was drilled across 4 cortices through the jig.  The jig was dismounted from the nail and removed.  These tight rope suture fixation device was  advanced through the tunnel and the button was flipped on the medial tibial cortex, confirming this fluoroscopically.  The suture fixation was then tightened down into place, resulting in excellent stabilization of the syndesmosis.  The medial malleolus fragment was quite small, and particularly in the setting of patient's skin quality and diabetic peripheral neuropathy, I opted not to perform a separate exposure fixation of the medial malleolus fragment.  Final x-rays were obtained, demonstrating anatomic reduction with restoration of fibular length and appropriate overall alignment.  Wounds were copiously irrigated hemostasis was obtained.  Wounds were closed with interrupted horizontal mattress sutures with #3-0 nylon.  Wounds were dressed with Xeroform, 4 x 4's, ABD, and Webril.  A short leg plaster AO splint was then molded to the patient's anatomy on the leg and allowed to set.  Patient was awakened from anesthesia and transferred to PACU in stable condition.  He tolerated procedure well.  No complications were noted intraoperatively.  POSTOPERATIVE INSTRUCTIONS: Mobility: Out of bed with PT/OT; elevate left lower extremity higher than heart is much as possible Pain control: Continue to wean/titrate to appropriate oral regimen DVT Prophylaxis: 81 mg aspirin twice daily for 2 weeks LLE: Nonweightbearing Dressing care: Keep splint clean, dry, and intact.  No dressing changes are required.  This will be removed in the office.  If the splint feels too tight, you may unwrap the Ace wrap, gently pull on the plaster/cotton, and rewrap the Ace a little bit looser.  It does need to be tight enough to keep the plaster splint on and in the right place, but sometimes postoperative swelling can make this tighter than it was in the operating room. Disposition: Per primary team as medically appropriate Follow-up: Please call [facility] [phone]) to schedule follow-op appointment  for 2 weeks after surgery.  TOURNIQUET TIME: N/A  BLOOD LOSS: 10 mL         DRAINS: None         SPECIMEN: None       COMPLICATIONS:  * No complications entered in OR log *         DISPOSITION: PACU - hemodynamically stable.         CONDITION: stable   Georgeanna Harrison M.D. Orthopaedic Surgery [facility]   Portions of the record have been created with voice recognition software.  Grammatical and punctuation errors, random word insertions, wrong-word or "sound-a-like" substitutions, pronoun errors (inaccuracies and/or substitutions), and/or incomplete sentences may have occurred due to the inherent limitations of voice recognition software.  Not all errors are caught or corrected.  Although every attempt is made to root out erroneous and incomplete transcription, the note may still not fully represent the intent or opinion of the author.  Read the chart carefully and recognize, using context, where errors/substitutions have occurred.  Any questions or concerns about the content of this note or information contained within the body of this dictation should be addressed directly with the author for clarification.

## 2022-06-02 NOTE — Consult Note (Signed)
Orthopaedic Consult  Date/Time: 06/02/22 3:42 PM  Patient Name: Rodney Farley  Attending Physician: Modena Jansky, MD    ASSESSMENT & PLAN  Orthopaedic Assessment: 88 y.o. male with left lateral malleolus fracture and syndesmosis disruption.  Plan: Given residual lateral subluxation and following closed reduction, recommend proceeding OR for open treatment of lateral malleolus fracture with syndesmotic stabilization.  Patient will remain nonweightbearing after the procedure in splint.   Georgeanna Harrison M.D. Orthopaedic Surgery [facility]   Medical Decision Making  Amount/complexity of data: Is there a current pathologic fracture (e.g. neoplastic, osteoporotic insufficiency fracture)? Yes Independent interpretation of radiographic studies: Yes Review of radiology results (e.g. reports): Yes Tests ordered (e.g. additional radiographic studies, labs): Yes Lab results reviewed: Yes Reviewed old records: Yes History from another source (independent historian, e.g. family/friend/etc.): No Discussion of imaging, clinical data, and or management with independent medical provider: Yes Risk: Patient receiving IV controlled substances for pain: Yes Fracture requiring manipulation: Yes Urgent or emergent (non-elective) surgery likely this admission: Yes Presence of medical comorbidities and/or surgical risk factors (e.g. current smoker, CAD, diabetes, COPD, CKD, etc.): Yes Closed fracture management WITHOUT manipulation: No Urgent minor procedure (e.g. joint aspiration, compartment pressure measurement, etc.): No Will likely need surgery as an outpatient: No     HPI Rodney Farley is a 88 y.o. male. Orthopaedic consultation has specifically been requested to address this patient's current musculoskeletal presentation.  He sustained a mechanical fall and twisted his left ankle, resulting in pain which was sharp and severe, worse with motion  better with relative immobilization, as well as inability bear weight.  Before the injury he was able to walk normally.  He does have a history of diabetes resulting in bilateral lower extremity peripheral neuropathy to the mid leg level.  He denies additional injuries.  This injury occurred last week, but as he lives alone he was admitted to the medicine service for social reasons.   PMH Past Medical History:  Diagnosis Date   AICD (automatic cardioverter/defibrillator) present    [employer] pacer   Anemia    Anxiety and depression    Arthritis    Bell's palsy    left side. after shingles episode   BPH associated with nocturia    Chronic systolic CHF (congestive heart failure) ([place])    EF normalized by Echo 2019   COPD (chronic obstructive pulmonary disease) ([place])    Severe   Coronary artery disease    a. s/p MI in 1994/1995 while in [place] s/p questionable PCI. 03/2015: progression of disease, for staged PCI.   Diabetic peripheral neuropathy (HCC)    GERD (gastroesophageal reflux disease)    Gout    Hard of hearing    B/L   History of chronic pancreatitis 07/23/2017   noted on CT abd/pelvis   History of shingles    Hypercholesterolemia    Hypertension    Obesity    Sleep apnea    "sleeps w/humidifyer when he panics and gets short of breath" (04/08/2015)   TIA (transient ischemic attack) X 3   Trigger middle finger of left hand    Type II diabetes mellitus ([place])    Wears glasses    Wears hearing aid      PSH Past Surgical History:  Procedure Laterality Date   APPENDECTOMY     AV FISTULA PLACEMENT Right 02/07/2019   Procedure: ARTERIOVENOUS (AV) FISTULA CREATION RIGHT UPPER ARM;  Surgeon: Waynetta Sandy, MD;  Location: [place];  Service: Vascular;  Laterality: Right;   BIV ICD GENERATOR CHANGEOUT N/A 10/09/2021   Procedure: BIV ICD GENERATOR CHANGEOUT;  Surgeon: Deboraha Sprang, MD;  Location: [place] [facility];  Service: Cardiovascular;  Laterality: N/A;    CARDIAC CATHETERIZATION N/A 03/29/2015   Procedure: Right/Left Heart Cath and Coronary Angiography;  Surgeon: Jerimy M [name], MD;  Location: [place] [facility];  Service: Cardiovascular;  Laterality: N/A;   [place]   "after my MI; put me on heart RX after cath"   CARDIAC CATHETERIZATION N/A 04/09/2015   Procedure: Coronary Stent Intervention;  Surgeon: Jermarcus M [name], MD;  Location: [place] [facility];  Service: Cardiovascular;  Laterality: N/A;   CATARACT EXTRACTION W/ INTRAOCULAR LENS  IMPLANT, BILATERAL     CHOLECYSTECTOMY N/A 10/13/2017   Procedure: LAPAROSCOPIC CHOLECYSTECTOMY WITH LYSIS OF ADHESIONS;  Surgeon: Ileana Roup, MD;  Location: [facility];  Service: General;  Laterality: N/A;   COLONOSCOPY     CORONARY ANGIOGRAPHY N/A 11/12/2021   Procedure: CORONARY ANGIOGRAPHY;  Surgeon: Wellington Hampshire, MD;  Location: [place] [facility];  Service: Cardiovascular;  Laterality: N/A;   CORONARY ANGIOGRAPHY N/A 12/24/2021   Procedure: CORONARY ANGIOGRAPHY;  Surgeon: Troy Sine, MD;  Location: [place] [facility];  Service: Cardiovascular;  Laterality: N/A;   CORONARY ANGIOGRAPHY N/A 03/31/2022   Procedure: CORONARY ANGIOGRAPHY;  Surgeon: Jettie Booze, MD;  Location: [place] [facility];  Service: Cardiovascular;  Laterality: N/A;   CORONARY ANGIOGRAPHY N/A 05/07/2022   Procedure: CORONARY ANGIOGRAPHY;  Surgeon: Jettie Booze, MD;  Location: [place] [facility];  Service: Cardiovascular;  Laterality: N/A;   CORONARY BALLOON ANGIOPLASTY N/A 12/24/2021   Procedure: CORONARY BALLOON ANGIOPLASTY;  Surgeon: Troy Sine, MD;  Location: [place] [facility];  Service: Cardiovascular;  Laterality: N/A;   CORONARY BALLOON ANGIOPLASTY N/A 02/09/2022   Procedure: CORONARY BALLOON ANGIOPLASTY;  Surgeon: Nelva Bush, MD;  Location: [place] [facility];  Service: Cardiovascular;  Laterality: N/A;  LAD   CORONARY BALLOON ANGIOPLASTY N/A 03/31/2022   Procedure: CORONARY  BALLOON ANGIOPLASTY;  Surgeon: Jettie Booze, MD;  Location: [place] [facility];  Service: Cardiovascular;  Laterality: N/A;   CORONARY BALLOON ANGIOPLASTY N/A 05/07/2022   Procedure: CORONARY BALLOON ANGIOPLASTY;  Surgeon: Jettie Booze, MD;  Location: [place] [facility];  Service: Cardiovascular;  Laterality: N/A;   CORONARY STENT INTERVENTION N/A 11/12/2021   Procedure: CORONARY STENT INTERVENTION;  Surgeon: Wellington Hampshire, MD;  Location: [place] [facility];  Service: Cardiovascular;  Laterality: N/A;  LAD   DENTAL SURGERY     EP IMPLANTABLE DEVICE N/A 09/23/2015   MDT CRT-D, Dr. Caryl Comes   HIATAL HERNIA REPAIR  1977   ILEOCECETOMY N/A 03/27/2017   Procedure: ILEOCECECTOMY;  Surgeon: Ileana Roup, MD;  Location: [place];  Service: General;  Laterality: N/A;   INSERT / REPLACE / REMOVE PACEMAKER  07/2008   Complete heart block status post DDD with good function   INTRAVASCULAR IMAGING/OCT N/A 12/24/2021   Procedure: INTRAVASCULAR IMAGING/OCT;  Surgeon: Troy Sine, MD;  Location: [place] [facility];  Service: Cardiovascular;  Laterality: N/A;   INTRAVASCULAR LITHOTRIPSY  02/09/2022   Procedure: INTRAVASCULAR LITHOTRIPSY;  Surgeon: Nelva Bush, MD;  Location: [place] [facility];  Service: Cardiovascular;;  LAD   INTRAVASCULAR ULTRASOUND/IVUS N/A 02/09/2022   Procedure: Intravascular Ultrasound/IVUS;  Surgeon: Nelva Bush, MD;  Location: [place] [facility];  Service: Cardiovascular;  Laterality: N/A;  LAD  IR FLUORO GUIDE CV LINE RIGHT  04/03/2019   IR US GUIDE VASC ACCESS RIGHT  04/03/2019   LAPAROTOMY N/A 03/27/2017   Procedure: EXPLORATORY LAPAROTOMY;  Surgeon: Ileana Roup, MD;  Location: [place];  Service: General;  Laterality: N/A;   RIGHT HEART CATH AND CORONARY ANGIOGRAPHY N/A 02/09/2022   Procedure: RIGHT HEART CATH AND CORONARY ANGIOGRAPHY;  Surgeon: Nelva Bush, MD;  Location: [place] [facility];  Service: Cardiovascular;  Laterality: N/A;    TONSILLECTOMY     UPPER GI ENDOSCOPY     Home Medications Prior to Admission medications   Medication Sig Start Date End Date Taking? Authorizing Provider  acetaminophen (TYLENOL) 500 MG tablet Take 500 mg by mouth as needed for moderate pain.   Yes [provider]  aspirin EC 81 MG tablet Take 81 mg by mouth daily.   Yes [provider]  atorvastatin (LIPITOR) 80 MG tablet Take 1 tablet by mouth daily. Patient taking differently: Take 80 mg by mouth at bedtime. 11/14/21  Yes Margie Billet, PA-C  B Complex-C-Folic Acid (NEPHRO VITAMINS) 0.8 MG TABS Take 1 tablet by mouth daily.   Yes [provider]  carbamide peroxide (DEBROX) 6.5 % OTIC solution Place 5 drops into both ears 2 (two) times daily as needed (earwax). Patient taking differently: Place 5 drops into both ears as needed (earwax). 11/13/21  Yes Lelon Perla, MD  Carboxymethylcellul-Glycerin (LUBRICATING EYE DROPS OP) Place 2-3 drops into both eyes in the morning, at noon, and at bedtime.   Yes [provider]  carvedilol (COREG) 3.125 MG tablet Take 1 tablet (3.125 mg total) by mouth 2 (two) times daily with a meal. Hold for SBP <100 mmhg OR hr < 60/min Patient taking differently: Take 3.125 mg by mouth See admin instructions. Take 3.125 twice daily on Tuesday, Thursday, Saturday, and Sunday. Take 3.125 mg in the evening on Monday, Wednesday, Friday. 05/08/22 06/07/22 Yes Kc, Maren Beach, MD  Evolocumab (REPATHA SURECLICK) 628 MG/ML SOAJ Inject 140 mg into the skin every 14 (fourteen) days. 04/22/22  Yes [name], Coben M, MD  ezetimibe (ZETIA) 10 MG tablet Take 1 tablet (10 mg total) by mouth daily. 05/08/22  Yes Antonieta Pert, MD  gabapentin (NEURONTIN) 100 MG capsule Take 210m twice daily for 1 week Then 100 mg twice daily for a week Then 100 mg before bed for a week Then stop Patient taking differently: Take 100 mg by mouth See admin instructions. 100 mg twice daily for a week Then 100 mg before bed  for a week. Then stop 05/19/22  Yes HMarin Olp MD  isosorbide mononitrate (IMDUR) 30 MG 24 hr tablet Take 1/2 tablet (15 mg total) by mouth daily. 05/09/22 06/08/22 Yes KAntonieta Pert MD  midodrine (PROAMATINE) 10 MG tablet Take 0.5-1 tablets (5-10 mg total) by mouth See admin instructions. Take 1 tablet (10 mg) three times a week. Add 5 mg a night after dialysis as needed for low blood pressure. Patient taking differently: Take 5-10 mg by mouth See admin instructions. Take 1 tablet (10 mg) three times a week on dialysis days. May take 5 mg a night after dialysis as needed for low blood pressure. 04/02/22  Yes AAline August MD  multivitamin (RENA-VIT) TABS tablet Take 1 tablet by mouth daily. 08/22/21  Yes HMarin Olp MD  nitroGLYCERIN (NITROSTAT) 0.4 MG SL tablet Place 1 tablet under the tongue every 5 minutes x 3 doses as needed for chest pain. 11/13/21  Yes JVikki Ports  R, PA-C  oxymetazoline (AFRIN) 0.05 % nasal spray Place 1 spray into both nostrils 2 (two) times daily as needed for congestion. Patient taking differently: Place 3-4 sprays into both nostrils as needed (nose bleeds). 12/29/21  Yes [name], MD  pantoprazole (PROTONIX) 40 MG tablet Take 1 tablet by mouth daily. 11/14/21  Yes Margie Billet, PA-C  saccharomyces boulardii (FLORASTOR) 250 MG capsule Take 1 capsule (250 mg total) by mouth 2 (two) times daily. Patient taking differently: Take 250 mg by mouth every evening. 04/08/19  Yes Regalado, Belkys A, MD  sevelamer carbonate (RENVELA) 800 MG tablet Take 2 tablets (1,600 mg total) by mouth 3 (three) times daily with meals. Patient taking differently: Take 800-2,400 mg by mouth See admin instructions. Take 2400 mg  by mouth twice a day. Take [phone] mg by mouth with snacks. 11/13/21  Yes Lelon Perla, MD  ticagrelor (BRILINTA) 90 MG TABS tablet Take 1 tablet (90 mg total) by mouth 2 (two) times daily. 05/08/22  Yes Antonieta Pert, MD  vitamin B-12  (CYANOCOBALAMIN) 1000 MCG tablet Take 1,000 mcg by mouth daily.   Yes [provider]  calcitRIOL (ROCALTROL) 0.25 MCG capsule Take 7 capsules (1.75 mcg total) by mouth every Monday, Wednesday, and Friday with hemodialysis. Patient taking differently: Take by mouth every Monday, Wednesday, and Friday with hemodialysis. 01/26/22   [name], Lecil M, MD  glucose blood (FREESTYLE TEST STRIPS) test strip Use to check blood sugar daily 05/01/19   [name], MD  glucose monitoring kit (FREESTYLE) monitoring kit USE TO MONITOR BLOOD GLUCOSE AS DIRECTED 05/01/19   [name], MD  Methoxy PEG-Epoetin Beta (MIRCERA IJ) as directed. 02/26/21 07/15/22  [provider]     Allergies Allergies  Allergen Reactions   Bee Venom Anaphylaxis   Lyrica [Pregabalin] Other (See Comments)    hallucinations   Prednisone Other (See Comments)    hallucinations   Zocor [Simvastatin] Nausea Only and Other (See Comments)    Headache with brand name only.  Can take the generic.     Family History Family History  Problem Relation Age of Onset   Stroke Mother    Leukemia Father    Stroke Sister    Heart attack Brother     Social History Social History   Socioeconomic History   Marital status: Legally Separated    Spouse name: Not on file   Number of children: 1   Years of education: college   Highest education level: Not on file  Occupational History   Occupation: Retired  Tobacco Use   Smoking status: Former    Packs/day: 1.50    Years: 54.00    Total pack years: 81.00    Types: Cigarettes    Quit date: 07/18/2007    Years since quitting: 14.8    Passive exposure: Never   Smokeless tobacco: Never  Vaping Use   Vaping Use: Never used  Substance and Sexual Activity   Alcohol use: Yes    Comment: rare   Drug use: No   Sexual activity: Not Currently  Other Topics Concern   Not on file  Social History Narrative   Married 1994 (together since 1989) 1 son, 1 stepson. 3  grandkids, 6 great grandkids      Retired from [employer]. Had 2 years of collge.       Faith: Mormon      Here on [employer] since 2004 from [place];    Social Determinants  of Health   Financial Resource Strain: Not on file  Food Insecurity: No Food Insecurity (05/07/2022)   Hunger Vital Sign    Worried About Running Out of Food in the Last Year: Never true    Ran Out of Food in the Last Year: Never true  Transportation Needs: No Transportation Needs (05/07/2022)   PRAPARE - Hydrologist (Medical): No    Lack of Transportation (Non-Medical): No  Physical Activity: Not on file  Stress: Not on file  Social Connections: Not on file  Intimate Partner Violence: Not At Risk (05/07/2022)   Humiliation, Afraid, Rape, and Kick questionnaire    Fear of Current or Ex-Partner: No    Emotionally Abused: No    Physically Abused: No    Sexually Abused: No     Review of Systems MSK: As noted per HPI above GI: No current Nausea/vomiting ENT: Denies sore throat, epistaxis CV: Denies chest pain  Resp: No current shortness of breath  Other than mentioned above, there are no Constitutional, Neurological, Psychiatric, ENT, Ophthalmological, Cardiovascular, Respiratory, GI, GU, Musculoskeletal, Integumentary, Lymphatic, Endocrine or Allergic issues.     Imaging  Independent interpretation of orthopaedic-relevant films: Multiple pre and postreduction views of the left ankle demonstrate bimalleolar equivalent left ankle fracture dislocation with this is likely syndesmotic disruption with residual lateral translation of the talus following closed reduction.  Radiographic results: DG Ankle Complete Left  Result Date: 05/29/2022 CLINICAL DATA:  Left ankle fracture subluxation, post reduction imaging EXAM: LEFT ANKLE COMPLETE - 3+ VIEW COMPARISON:  05/28/2022 FINDINGS: Interval closed reduction of left ankle fracture subluxation. Distal left fibular  diaphyseal fracture demonstrates 1/2 shaft with lateral displacement and mild posterior angulation. Medial joint spacel widening is improved with residual 1 cm lateral subluxation of the talar dome in relation to the tibial plafond. External immobilizer has been placed. Bimalleolar soft tissue swelling again noted. IMPRESSION: 1. Interval closed reduction of left ankle fracture subluxation. Residual lateral subluxation of the talar dome in relation to the tibial plafond. Electronically Signed   By: Fidela Salisbury M.D.   On: 05/29/2022 01:55   DG Ankle Complete Left  Result Date: 05/29/2022 CLINICAL DATA:  Fall, left ankle pain EXAM: LEFT ANKLE COMPLETE - 3+ VIEW COMPARISON:  None Available. FINDINGS: Acute fracture subluxation of the left ankle noted. There is an oblique fracture of the distal left fibular diaphysis 3 cm proximal to the tibial plafond and with 1 shaft with lateral displacement and moderate posterior angulation of the distal fracture fragment. There is 2 cm lateral and 1.5 cm posterior subluxation of the talar dome in relation to the tibial plafond with marked widening of the medial joint space. Multiple ossific densities seen within the medial joint space appear corticated likely reflect the sequela of remote trauma or inflammation. There is moderate bimalleolar soft tissue swelling. Vascular calcifications are noted. IMPRESSION: 1. Acute fracture subluxation of the left ankle as described above. 2. Moderate bimalleolar soft tissue swelling. Electronically Signed   By: Fidela Salisbury M.D.   On: 05/29/2022 00:08   CARDIAC CATHETERIZATION  Result Date: 05/07/2022   Ost RCA to Prox RCA lesion is 70% stenosed.  Small vessel.   Prox Cx lesion is 30% stenosed.   Mid Cx to Dist Cx lesion is 40% stenosed.   Mid Cx lesion is 30% stenosed.   2nd Diag-1 lesion is 30% stenosed.   3rd Mrg lesion is 50% stenosed.   Non-stenotic 2nd Diag-2 lesion was previously treated.  Patent stent.   Dist LM to Ost  LAD lesion is 90% stenosed.   Scoring balloon angioplasty was performed using a BALLN SCOREFLEX 3.50X10.   Post intervention, there is a 20% residual stenosis. Another episode of restenosis in the ostial to proximal LAD.  Successful balloon angioplasty with score flex balloon at high pressure followed by [place] balloon at high pressure. Unfortunately, there are no other options at our facility.  We will need to look into availability of drug-coated balloon or brachytherapy as possible treatments if this recurs.   ECHOCARDIOGRAM COMPLETE  Result Date: 05/07/2022    ECHOCARDIOGRAM REPORT   Patient Name:   JOVIN FESTER Date of Exam: 05/07/2022 Medical Rec #:  [id number]             Height:       67.0 in Accession #:    [id number]            Weight:       205.9 lb Date of Birth:  [birth year]             BSA:          2.047 m Patient Age:    64 years              BP:           122/49 mmHg Patient Gender: M                     HR:           80 bpm. Exam Location:  Inpatient Procedure: 2D Echo, Cardiac Doppler, Color Doppler and Intracardiac            Opacification Agent Indications:    R01.1 Murmur; R07.9* Chest pain, unspecified  History:        Patient has prior history of Echocardiogram examinations, most                 recent 11/11/2021. CHF, Previous Myocardial Infarction and CAD,                 Abnormal ECG, Defibrillator and Pacemaker, COPD, Aortic Valve                 Disease, Arrythmias:AV Block, Signs/Symptoms:Chest Pain,                 Hypotension, Murmur, Dyspnea and Shortness of Breath; Risk                 Factors:Diabetes and Hypertension. ESRD. Aortic stenosis.  Sonographer:    Roseanna Rainbow RDCS Referring Phys: [id number] CHRISTOPHER P DANFORD  Sonographer Comments: Technically difficult study due to poor echo windows. Image acquisition challenging due to patient body habitus. IMPRESSIONS  1. Left ventricular ejection fraction, by estimation, is 45 to 50%. The left ventricle has mildly decreased  function. The left ventricle demonstrates regional wall motion abnormalities (see scoring diagram/findings for description). Left ventricular diastolic parameters are indeterminate.  2. Right ventricular systolic function is normal. The right ventricular size is normal. There is normal pulmonary artery systolic pressure.  3. The mitral valve is normal in structure. Trivial mitral valve regurgitation. No evidence of mitral stenosis. Moderate mitral annular calcification.  4. The aortic valve is normal in structure. There is mild calcification of the aortic valve. There is mild thickening of the aortic valve. Aortic valve regurgitation is trivial. Moderate aortic valve stenosis. Aortic valve area, by VTI measures 1.42 cm. Aortic valve mean gradient measures 20.0  mmHg. Aortic valve Vmax measures 2.97 m/s.  5. The inferior vena cava is normal in size with greater than 50% respiratory variability, suggesting right atrial pressure of 3 mmHg. Comparison(s): A prior study was performed on 11/2021. No significant change from prior study. FINDINGS  Left Ventricle: Left ventricular ejection fraction, by estimation, is 45 to 50%. The left ventricle has mildly decreased function. The left ventricle demonstrates regional wall motion abnormalities. Definity contrast agent was given IV to delineate the left ventricular endocardial borders. The left ventricular internal cavity size was normal in size. There is borderline concentric left ventricular hypertrophy. Left ventricular diastolic function could not be evaluated due to mitral annular calcification (moderate or greater). Left ventricular diastolic parameters are indeterminate.  LV Wall Scoring: The apical septal segment, apical inferior segment, and apex are hypokinetic. The entire anterior wall, entire lateral wall, anterior septum, inferior wall, mid inferoseptal segment, and basal inferoseptal segment are normal. Right Ventricle: The right ventricular size is normal. No  increase in right ventricular wall thickness. Right ventricular systolic function is normal. There is normal pulmonary artery systolic pressure. The tricuspid regurgitant velocity is 2.12 m/s, and  with an assumed right atrial pressure of 3 mmHg, the estimated right ventricular systolic pressure is 78.2 mmHg. Left Atrium: Left atrial size was normal in size. Right Atrium: Right atrial size was normal in size. Pericardium: There is no evidence of pericardial effusion. Presence of epicardial fat layer. Mitral Valve: The mitral valve is normal in structure. Moderate mitral annular calcification. Trivial mitral valve regurgitation. No evidence of mitral valve stenosis. Tricuspid Valve: The tricuspid valve is normal in structure. Tricuspid valve regurgitation is not demonstrated. No evidence of tricuspid stenosis. Aortic Valve: The aortic valve is normal in structure. There is mild calcification of the aortic valve. There is mild thickening of the aortic valve. There is moderate aortic valve annular calcification. Aortic valve regurgitation is trivial. Moderate aortic stenosis is present. Aortic valve mean gradient measures 20.0 mmHg. Aortic valve peak gradient measures 35.2 mmHg. Aortic valve area, by VTI measures 1.42 cm. Pulmonic Valve: The pulmonic valve was normal in structure. Pulmonic valve regurgitation is not visualized. No evidence of pulmonic stenosis. Aorta: The aortic root is normal in size and structure. Venous: The inferior vena cava is normal in size with greater than 50% respiratory variability, suggesting right atrial pressure of 3 mmHg. IAS/Shunts: No atrial level shunt detected by color flow Doppler. Additional Comments: A device lead is visualized.  LEFT VENTRICLE PLAX 2D LVIDd:         5.70 cm      Diastology LVIDs:         4.20 cm      LV e' medial:    5.66 cm/s LV PW:         1.20 cm      LV E/e' medial:  20.1 LV IVS:        1.30 cm      LV e' lateral:   7.25 cm/s LVOT diam:     2.30 cm      LV  E/e' lateral: 15.7 LV SV:         98 LV SV Index:   48 LVOT Area:     4.15 cm  LV Volumes (MOD) LV vol d, MOD A2C: 153.0 ml LV vol d, MOD A4C: 164.0 ml LV vol s, MOD A2C: 110.0 ml LV vol s, MOD A4C: 119.0 ml LV SV MOD A2C:     43.0 ml LV  SV MOD A4C:     164.0 ml LV SV MOD BP:      38.5 ml RIGHT VENTRICLE            IVC RV S prime:     6.65 cm/s  IVC diam: 2.70 cm TAPSE (M-mode): 2.3 cm LEFT ATRIUM             Index        RIGHT ATRIUM           Index LA diam:        4.70 cm 2.30 cm/m   RA Area:     22.20 cm LA Vol (A2C):   54.2 ml 26.47 ml/m  RA Volume:   68.20 ml  33.31 ml/m LA Vol (A4C):   48.9 ml 23.88 ml/m LA Biplane Vol: 53.2 ml 25.98 ml/m  AORTIC VALVE AV Area (Vmax):    1.46 cm AV Area (Vmean):   1.39 cm AV Area (VTI):     1.42 cm AV Vmax:           296.60 cm/s AV Vmean:          208.400 cm/s AV VTI:            0.696 m AV Peak Grad:      35.2 mmHg AV Mean Grad:      20.0 mmHg LVOT Vmax:         104.00 cm/s LVOT Vmean:        69.800 cm/s LVOT VTI:          0.237 m LVOT/AV VTI ratio: 0.34  AORTA Ao Root diam: 3.20 cm Ao Asc diam:  3.50 cm MITRAL VALVE                TRICUSPID VALVE MV Area (PHT): 3.85 cm     TR Peak grad:   18.0 mmHg MV Decel Time: 197 msec     TR Vmax:        212.00 cm/s MV E velocity: 114.00 cm/s MV A velocity: 102.05 cm/s  SHUNTS MV E/A ratio:  1.12         Systemic VTI:  0.24 m                             Systemic Diam: 2.30 cm Kardie Tobb DO Electronically signed by Berniece Salines DO Signature Date/Time: 05/07/2022/10:51:40 AM    Final    DG Chest Portable 1 View  Result Date: 05/06/2022 CLINICAL DATA:  Shortness of breath which is worsening EXAM: PORTABLE CHEST 1 VIEW COMPARISON:  03/30/2022 FINDINGS: Chronic cardiomegaly. Pacemaker/AICD appears grossly unchanged. Pulmonary venous hypertension, possibly with mild interstitial edema. No pleural effusion. No focal consolidation. IMPRESSION: Chronic cardiomegaly. Pulmonary venous hypertension, possibly with mild interstitial  edema. Electronically Signed   By: Nelson Chimes M.D.   On: 05/06/2022 14:03   Labs  Recent Labs    05/28/22 2357 05/30/22 0219 06/01/22 0248  WBC 5.0 6.1 9.4  HGB 11.7* 9.3* 10.3*  HCT 35.7* 27.3* 31.3*  PLT 120* 120* 174   Recent Labs    05/28/22 2357 05/30/22 0219 05/31/22 0250 06/01/22 0248 06/02/22 0425  NA 139 138 135 137 136  K 5.0 5.3* 4.8 4.3 4.6  CL 96* 96* 92* 96* 90*  CO2 _0 BUN 23 35* 22 18 39*  CREATININE 6.90* 8.80* 6.28* 5.00* 7.37*  GLUCOSE 124* 99 156* 101* 104*  CALCIUM  9.3 8.6* 8.7* 9.3 9.4   Lab Results  Component Value Date   INR 1.0 02/08/2022   INR 1.3 (H) 04/03/2019   INR 0.97 10/13/2017        Physical Examination  Patient is a 88 y.o. year old male who is chronically ill appearing, mood is calm.  Orientation: oriented to person, place, time, and general circumstances  Vital Signs: BP 110/70   Pulse 78   Temp 97.9 F (36.6 C) (Oral)   Resp (!) 22   Ht _0  (1.702 m)   Wt 90.1 kg   SpO2 96%   BMI 31.11 kg/m    Gait: Unable to ambulate with current injury.  Supine on hospital bed.  Heart: Normal rate Lungs: Non-labored breathing Abdomen: Soft, Non-tender   Right Upper Extremity: Inspection: Atraumatic Palpation: Nontender ROM: Full, painless Strength: Normal Sensation: Intact to light touch distally Skin: Intact Peripheral Vascular: Well perfused Joint Stability: No instability Reflexes: No pathologic Lymph Nodes: None Palpable Coordination: Intact, normal   Left Upper Extremity: Inspection: Atraumatic Palpation: Nontender ROM: Full, painless Strength: Normal Sensation: Intact to light touch distally Skin: Intact Peripheral Vascular: Well perfused Joint Stability: No instability Reflexes: No pathologic Lymph Nodes: None Palpable Coordination: Intact, normal    Right Lower Extremity: Inspection: Atraumatic Palpation: Nontender ROM: Full, painless Strength: Normal Sensation: Baseline limited to  light touch distally in all distributions setting baseline peripheral neuropathy Skin: Intact Peripheral Vascular: Well perfused Joint Stability: No instability Reflexes: No pathologic Lymph Nodes: None Palpable Coordination: Intact, normal   Left Lower Extremity: Inspection: Well padding AO splint clean dry and intact Palpation: Very tender palpation site of known fracture ROM: Ankle range of motion limited due to pain, injury, and immobilization Strength: Able to flex and extend the toes Sensation: Baseline limited to light touch distally in all distributions setting baseline peripheral neuropathy Skin: Ashy, intact Peripheral Vascular: Warm and well-perfused distally Joint Stability: Residual subluxation of talus Reflexes: No pathologic Lymph Nodes: None Palpable Coordination: Limited by pain and injury    Pelvis: Skin: Intact Palpation: Nontender Stability: No instability      The review of the patient's medications does not in any way constitute an endorsement, by this clinician,  of their use, dosage, indications, route, efficacy, interactions, or other clinical parameters.  This note was generated within the [employer] EMR using Dragon medical speech recognition software and may contain inherent errors or omissions not intended by the user. Grammatical and punctuation errors, random word insertions, deletions, pronoun errors and incomplete sentences are occasional consequences of this technology due to software limitations. Not all errors are caught or corrected.  Although every attempt is made to root out erroneus and incomplete transcription, the note may still not fully represent the intent or opinion of the author. If there are questions or concerns about the content of this note or information contained within the body of this dictation they should be addressed directly with the author for clarification.

## 2022-06-02 NOTE — Anesthesia Preprocedure Evaluation (Addendum)
Anesthesia Evaluation  Patient identified by MRN, date of birth, ID band Patient awake    Reviewed: Allergy & Precautions, NPO status , Patient's Chart, lab work & pertinent test results, reviewed documented beta blocker date and time   Airway Mallampati: II  TM Distance: >3 FB Neck ROM: Full    Dental  (+) Edentulous Upper, Edentulous Lower   Pulmonary sleep apnea , COPD, former smoker   Pulmonary exam normal breath sounds clear to auscultation       Cardiovascular hypertension, Pt. on home beta blockers and Pt. on medications + CAD, + Past MI, + Cardiac Stents and +CHF  Normal cardiovascular exam+ dysrhythmias Atrial Fibrillation + pacemaker + Cardiac Defibrillator + Valvular Problems/Murmurs (mod AS) AS  Rhythm:Regular Rate:Normal  TTE 2023 1. Left ventricular ejection fraction, by estimation, is 45 to 50%. The  left ventricle has mildly decreased function. The left ventricle  demonstrates regional wall motion abnormalities (see scoring  diagram/findings for description). Left ventricular  diastolic parameters are indeterminate.   2. Right ventricular systolic function is normal. The right ventricular  size is normal. There is normal pulmonary artery systolic pressure.   3. The mitral valve is normal in structure. Trivial mitral valve  regurgitation. No evidence of mitral stenosis. Moderate mitral annular  calcification.   4. The aortic valve is normal in structure. There is mild calcification  of the aortic valve. There is mild thickening of the aortic valve. Aortic  valve regurgitation is trivial. Moderate aortic valve stenosis. Aortic  valve area, by VTI measures 1.42  cm. Aortic valve mean gradient measures 20.0 mmHg. Aortic valve Vmax  measures 2.97 m/s.   5. The inferior vena cava is normal in size with greater than 50%  respiratory variability, suggesting right atrial pressure of 3 mmHg.   Cath 2023   Ost RCA to  Prox RCA lesion is 70% stenosed.  Small vessel.   Prox Cx lesion is 30% stenosed.   Mid Cx to Dist Cx lesion is 40% stenosed.   Mid Cx lesion is 30% stenosed.   2nd Diag-1 lesion is 30% stenosed.   3rd Mrg lesion is 50% stenosed.   Non-stenotic 2nd Diag-2 lesion was previously treated.  Patent stent.   Dist LM to Ost LAD lesion is 90% stenosed.   Scoring balloon angioplasty was performed using a BALLN SCOREFLEX 3.50X10.   Post intervention, there is a 20% residual stenosis.    Neuro/Psych  PSYCHIATRIC DISORDERS Anxiety Depression    TIA   GI/Hepatic Neg liver ROS,GERD  ,,  Endo/Other  diabetes    Renal/GU ESRF and DialysisRenal diseaseLab Results      Component                Value               Date                      CREATININE               4.30 (H)            06/02/2022                BUN                      19                  06/02/2022  NA                       134 (L)             06/02/2022                K                        4.2                 06/02/2022                CL                       93 (L)              06/02/2022                CO2                      27                  06/02/2022             negative genitourinary   Musculoskeletal  (+) Arthritis ,    Abdominal   Peds  Hematology  (+) Blood dyscrasia (on brilinta), anemia Lab Results      Component                Value               Date                      WBC                      9.4                 06/01/2022                HGB                      11.9 (L)            06/02/2022                HCT                      35.0 (L)            06/02/2022                MCV                      103.6 (H)           06/01/2022                PLT                      174                 06/01/2022              Anesthesia Other Findings PMH of extensive cardiac history including CAD, NSTEMI, multiple PCI's including most recently on 05/07/2022 when he had PCI with balloon  angioplasty for recurrent in-stent stenosis of LAD, ischemic cardiomyopathy, chronic systolic CHF with  most recent LVEF of 45-50% on 11/11/2021, complete heart block s/p PPM which was upgraded to ICD-CRT in 2017, subclinical atrial fibrillation not on anticoagulation, moderate aortic stenosis, chronic hypotension on midodrine, HLD, DM 2, ESRD on HD MWF, OSA, COPD, TIA presented to the ED on 05/29/2022 following a mechanical fall at hom  Reproductive/Obstetrics                             Anesthesia Physical Anesthesia Plan  ASA: 4  Anesthesia Plan: Regional and MAC   Post-op Pain Management: Regional block* and Tylenol PO (pre-op)*   Induction:   PONV Risk Score and Plan: 1 and Ondansetron, Dexamethasone, Treatment may vary due to age or medical condition and TIVA  Airway Management Planned: Natural Airway  Additional Equipment:   Intra-op Plan:   Post-operative Plan: Extubation in OR  Informed Consent: I have reviewed the patients History and Physical, chart, labs and discussed the procedure including the risks, benefits and alternatives for the proposed anesthesia with the patient or authorized representative who has indicated his/her understanding and acceptance.     Dental advisory given  Plan Discussed with: CRNA  Anesthesia Plan Comments:        Anesthesia Quick Evaluation

## 2022-06-02 NOTE — Anesthesia Procedure Notes (Signed)
Anesthesia Regional Block: Adductor canal block   Pre-Anesthetic Checklist: , timeout performed,  Correct Patient, Correct Site, Correct Laterality,  Correct Procedure, Correct Position, site marked,  Risks and benefits discussed,  Pre-op evaluation,  At surgeon's request and post-op pain management  Laterality: Left  Prep: Maximum Sterile Barrier Precautions used, chloraprep       Needles:  Injection technique: Single-shot  Needle Type: Echogenic Stimulator Needle     Needle Length: 9cm  Needle Gauge: 21     Additional Needles:   Procedures:,,,, ultrasound used (permanent image in chart),,    Narrative:  Start time: 06/02/2022 4:32 PM End time: 06/02/2022 4:36 PM Injection made incrementally with aspirations every 5 mL. Anesthesiologist: Freddrick March, MD

## 2022-06-02 NOTE — Progress Notes (Addendum)
Pt receives out-pt HD at [facility] on MWF. Pt arrives at 11:00 for 11:20 chair time. Will assist as needed.   Chrisean Kloth Renal Navigator [phone] 

## 2022-06-02 NOTE — Progress Notes (Signed)
   06/02/22 1221  Vitals  Temp 97.9 F (36.6 C)  Pulse Rate 80  Resp (!) 22  BP (!) 102/53  SpO2 96 %  O2 Device Room Air  Weight 90.1 kg  Type of Weight Post-Dialysis  Oxygen Therapy  Patient Activity (if Appropriate) In bed  Post Treatment  Dialyzer Clearance Lightly streaked  Duration of HD Treatment -hour(s) 4 hour(s)  Liters Processed 96  Fluid Removed (mL) 1.5 mL  Tolerated HD Treatment Yes  AVG/AVF Arterial Site Held (minutes) 5 minutes  AVG/AVF Venous Site Held (minutes) 5 minutes   Received patient in bed to unit.  Alert and oriented.  Informed consent signed and in chart.    Patient tolerated well.  Transported back to the room  Alert, without acute distress.  Hand-off given to patient's nurse.   Access used: RUA AVF Access issues: none  Total UF removed: 1.5L Medication(s) given: none    [facility]

## 2022-06-02 NOTE — Progress Notes (Signed)
PROGRESS NOTE   Rodney Farley  GDJ:[id number]    DOB: 06-08-1932    DOA: 05/28/2022  PCP: [name], MD   I have briefly reviewed patients previous medical records in [facility].  Chief Complaint  Patient presents with   Leg Injury    Brief Narrative:  88 year old male, lives alone, ambulates with the help of a cane or a walker, PMH of extensive cardiac history including CAD, NSTEMI, multiple PCI's including most recently on 05/07/2022 when he had PCI with balloon angioplasty for recurrent in-stent stenosis of LAD, ischemic cardiomyopathy, chronic systolic CHF with most recent LVEF of 45-50% on 11/11/2021, complete heart block s/p PPM which was upgraded to ICD-CRT in 2017, subclinical atrial fibrillation not on anticoagulation, moderate aortic stenosis, chronic hypotension on midodrine, HLD, DM 2, ESRD on HD MWF, OSA, COPD, TIA presented to the ED on 05/29/2022 following a mechanical fall at home the night prior followed by severe pain in his left ankle.  Diagnosed with left ankle fracture.  Orthopedics consulted and plan surgical intervention on 11/21 at noon.  Cardiology consulted and provided preop clearance.  Awaiting surgery.  Unfortunately patient had breakfast this morning.  As per discussion with orthopedics, still trying to operate on him later today.  If he cannot get surgery today, it is likely that his surgery will be delayed until after the Thanksgiving holiday.  Assessment & Plan:  Principal Problem:   Closed left ankle fracture Active Problems:   Accidental fall   Hypotension   ESRD on hemodialysis (HCC)   Ischemic cardiomyopathy   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   Chronic systolic CHF (congestive heart failure) (HCC)   CAD in native artery   Anemia in chronic kidney disease   Atrioventricular block, complete (HCC)   Pacemaker   Type II diabetes mellitus ([place])   Hyperlipidemia   Obesity (BMI 30-39.9)   Sleep apnea    Thrombocytopenia (HCC)   Essential hypertension   ESRD on dialysis ([place])   S/P coronary artery stent placement   Left ankle fracture: Sustained s/p mechanical fall after he slipped on some paper at home.  Initial left ankle x-ray: Acute ankle fracture subluxation of the left ankle.  Follow-up left ankle x-ray: Interval closed reduction of left ankle fracture subluxation.  Orthopedics consultation appreciated, NWB LLE, plan ORIF 11/20.  Please refer to cardiology consultation note from 11/17 for preop clearance.  They indicate that he is a high surgical risk given his multiple comorbidities.  Holding P2 Y12 inhibitors will be very high risk given his recurrent in-stent restenosis of this region and his recent procedures.  They do not recommend any further cardiac testing or optimization for surgery.  They prefer conservative measures if possible.  If surgery required and ticagrelor needs to be held, they recommend transitioning to cangrelor perioperatively.  Patient indicates no pain while nonweightbearing but volunteers to pain on minimal movement.  Multimodality pain control.  As per RN, patient scheduled for surgery on 11/21 at noon.  Seen at dialysis this morning.  Patient reportedly ate a full breakfast this morning at around 7:30 AM.  Informed Ortho PA regarding this and signed as per my discussion with him, still trying to operate on him later today.  If he cannot get surgery today, it is likely that his surgery will be delayed until after the Thanksgiving holiday.  Not sure how he got a breakfast tray if he had an n.p.o. order.  Chronic hypotension: Clinically not volume  overloaded.  Gets midodrine pre-HD 3 times a week.   ESRD on HD MWF/mild hyperkalemia: Nephrology consulted.  Underwent of scheduled HD on 11/18 and 11/19 as per Thanksgiving holiday schedule.  Undergoing hemodialysis this morning.   Chronic systolic CHF ischemic cardiomyopathy status post AICD Clinically euvolemic.  Volume  management across HD.  Last echocardiogram noted EF to be 45 -50% with indeterminate diastolic parameters.     CAD, NSTEMI, multiple PCI's Extensive cardiac history as noted above.  Currently without anginal symptoms.  Most recent angioplasty and PCI of LAD in-stent stenosis on 05/07/2022. Continue aspirin, Brilinta, and statin.  Stable.   Anemia of chronic kidney disease/ESRD Patient is on vitamin B12 supplementation and EPO shots with dialysis, to be continued.  Stable.   Complete heart block s/p permanent pacemaker AV paced on telemetry.   Diabetes mellitus type II, without long-term use of insulin A1c 5.9 suggests tight control.  Not on SSI.   Hyperlipidemia Goal LDL less than 50. Continue atorvastatin and Zetia. Resume Repatha in outpatient setting   Thrombocytopenia Resolved.   OSA Patient reports that he does not use CPAP at night.  Body mass index is 31.11 kg/m./Obesity   DVT prophylaxis: heparin injection 5,000 Units Start: 05/29/22 0915     Code Status: Full Code:  Family Communication: Discussed in detail with patient's son via phone, updated care and answered all questions.  He was concerned regarding the delay in patient having his surgery and was hopeful that this could be done soon. Disposition:  Status is: Inpatient Remains inpatient appropriate because: Need for surgical intervention for left ankle fracture, scheduled for 11/21 and then will likely need SNF.     Consultants:   Orthopedics Cardiology Nephrology  Procedures:   HD  Antimicrobials:      Subjective:  Seen this morning at hemodialysis.  He reports to me that he ate a full muffin, drank a cup of some juice and ate a full serving of scrambled eggs at around 7:30 AM.  States that he should have known better.  Denies complaints.  Objective:   Vitals:   06/02/22 1100 06/02/22 1130 06/02/22 1200 06/02/22 1221  BP: (!) 118/45 (!) 112/55 (!) 140/92 (!) 102/53  Pulse: 79 80 82 80  Resp: 20  (!) 22 (!) 21 (!) 22  Temp:    97.9 F (36.6 C)  TempSrc:    Oral  SpO2: 97% 99% 98% 96%  Weight:    90.1 kg  Height:        General exam: Elderly male, moderately built and obese lying comfortably supine in bed without distress. Respiratory system: Clear to auscultation.  No increased work of breathing. Cardiovascular system: S1 and S2 heard, RRR.  No JVD or pedal edema.  2/6 systolic murmur at apex.  Left upper chest PPM box.  Telemetry personally reviewed: AV paced rhythm. Gastrointestinal system: Abdomen is nondistended, soft and nontender. No organomegaly or masses felt. Normal bowel sounds heard.  Long midline laparotomy scar. Central nervous system: Alert and oriented. No focal neurological deficits. Extremities: Symmetric 5 x 5 power except left ankle due to pain and currently has crepe bandage around.. Skin: No rashes, lesions or ulcers Psychiatry: Judgement and insight appear normal. Mood & affect pleasant and appropriate.     Data Reviewed:   I have personally reviewed following labs and imaging studies   CBC: Recent Labs  Lab 05/28/22 2357 05/30/22 0219 06/01/22 0248  WBC 5.0 6.1 9.4  NEUTROABS 2.8  --   --  HGB 11.7* 9.3* 10.3*  HCT 35.7* 27.3* 31.3*  MCV 106.3* 103.8* 103.6*  PLT 120* 120* 604    Basic Metabolic Panel: Recent Labs  Lab 05/28/22 2357 05/30/22 0219 05/31/22 0250 06/01/22 0248 06/02/22 0425  NA 139 138 135 137 136  K 5.0 5.3* 4.8 4.3 4.6  CL 96* 96* 92* 96* 90*  CO2 27 30 30 29 27   GLUCOSE 124* 99 156* 101* 104*  BUN 23 35* 22 18 39*  CREATININE 6.90* 8.80* 6.28* 5.00* 7.37*  CALCIUM 9.3 8.6* 8.7* 9.3 9.4  PHOS 4.3 5.2* 4.0 4.4 5.1*    Liver Function Tests: Recent Labs  Lab 05/30/22 0219 05/31/22 0250 06/01/22 0248 06/02/22 0425  ALBUMIN 2.9* 3.0* 3.1* 2.9*    CBG: No results for input(s): "GLUCAP" in the last 168 hours.  Microbiology Studies:  No results found for this or any previous visit (from the past 240  hour(s)).  Radiology Studies:  No results found.  Scheduled Meds:    (feeding supplement) PROSource Plus  30 mL Oral BID BM   acidophilus  1 capsule Oral Daily   aspirin EC  81 mg Oral Daily   atorvastatin  80 mg Oral QHS   calcitRIOL  1.75 mcg Oral Q M,W,F-HD   [START ON 06/06/2022] carvedilol  3.125 mg Oral 2 times per day on Sun Tue Thu Sat   [START ON 06/05/2022] carvedilol  3.125 mg Oral Once per day on Mon Wed Fri   carvedilol  3.125 mg Oral 2 times per day on Mon Wed Thu   carvedilol  3.125 mg Oral Once per day on Tue   Chlorhexidine Gluconate Cloth  6 each Topical Q0600   cyanocobalamin  1,000 mcg Oral Daily   darbepoetin (ARANESP) injection - DIALYSIS  100 mcg Subcutaneous Q Tue-1800   ezetimibe  10 mg Oral Daily   gabapentin  100 mg Oral BID   Followed by   Derrill Memo ON 06/05/2022] gabapentin  100 mg Oral QHS   heparin  5,000 Units Subcutaneous Q8H   isosorbide mononitrate  15 mg Oral Daily   melatonin  3 mg Oral QHS   midodrine  10 mg Oral Q M,W,F   midodrine  10 mg Oral Once in dialysis   multivitamin  1 tablet Oral Daily   pantoprazole  40 mg Oral Daily   senna  1 tablet Oral QHS   sevelamer carbonate  1,600 mg Oral With snacks   sevelamer carbonate  2,400 mg Oral BID WC   ticagrelor  90 mg Oral BID    Continuous Infusions:     LOS: 4 days     Vernell Leep, MD,  FACP, FHM, SFHM, [facility], [place]     To contact the attending provider between 7A-7P or the covering provider during after hours 7P-7A, please log into the web site [email or web address] and access using universal [place] password for that web site. If you do not have the password, please call the hospital operator.  06/02/2022, 1:13 PM

## 2022-06-02 NOTE — Anesthesia Procedure Notes (Signed)
Anesthesia Regional Block: Popliteal block   Pre-Anesthetic Checklist: , timeout performed,  Correct Patient, Correct Site, Correct Laterality,  Correct Procedure, Correct Position, site marked,  Risks and benefits discussed,  Pre-op evaluation,  At surgeon's request and post-op pain management  Laterality: Left  Prep: Maximum Sterile Barrier Precautions used, chloraprep       Needles:  Injection technique: Single-shot  Needle Type: Echogenic Stimulator Needle     Needle Length: 9cm  Needle Gauge: 21     Additional Needles:   Procedures:,,,, ultrasound used (permanent image in chart),,    Narrative:  Start time: 06/02/2022 4:25 PM End time: 06/02/2022 4:31 PM Injection made incrementally with aspirations every 5 mL. Anesthesiologist: Freddrick March, MD

## 2022-06-02 NOTE — Transfer of Care (Signed)
Immediate Anesthesia Transfer of Care Note  Patient: Rodney Farley  Procedure(s) Performed: OPEN REDUCTION INTERNAL FIXATION (ORIF) LEFT LATERAL MALLEOLUS (Left: Ankle)  Patient Location: PACU  Anesthesia Type:MAC and Regional  Level of Consciousness: drowsy  Airway & Oxygen Therapy: Patient Spontanous Breathing and Patient connected to nasal cannula oxygen  Post-op Assessment: Report given to RN and Post -op Vital signs reviewed and stable  Post vital signs: Reviewed and stable  Last Vitals:  Vitals Value Taken Time  BP 110/74   Temp    Pulse 80   Resp 18   SpO2 96     Last Pain:  Vitals:   06/02/22 1550  TempSrc:   PainSc: 0-No pain      Patients Stated Pain Goal: 0 (32/12/24 8250)  Complications: No notable events documented.

## 2022-06-02 NOTE — Discharge Instructions (Signed)
Discharge instructions for Dr. Georgeanna Harrison, M.D., Orthopaedic Surgeon, [place]:  Diet: As you were doing prior to hospitalization, unless instructed otherwise by medical team, dietary/nutrition team, etc. Dressing:  Keep splint clean, dry, and intact.  No dressing changes are required.  This will be removed in the office.  If the splint feels too tight, you may unwrap the Ace wrap, gently pull on the plaster/cotton, and rewrap the Ace a little bit looser.  It does need to be tight enough to keep the plaster splint on and in the right place, but sometimes postoperative swelling can make this tighter than it was in the operating room. Shower:  Unless otherwise specified, may shower but keep the wounds dry, use an occlusive plastic wrap, NO SOAKING IN TUB.  If the bandage gets wet, change with a clean dry gauze.  Unless otherwise specified, after 5 days dressing(s) should be removed (7 days for PREVENA dressings) and wound(s) may get wet in the shower by allowing water to gently run over.  Again, no soaking in tub, and do NOT submerge for at least 2 weeks!!! Activity:  Increase activity slowly as tolerated.  If you right leg is injured or immobilized, no driving for 6 weeks or until discussed with your surgeon.  If you have an injury or immobilization of the left lower extremity you may not operate a clutch. Please note that driving with any kind of immobilization for the upper extremity (sling, shoulder brace, splint, cast, etc.) may also be considered impaired driving and should not be attempted. Weight Bearing: NON-WEIGHTBEARING (NWB) on the LEFT LOWER EXTREMITY. To prevent constipation: You may use over-the-counter stool softener(s) such as Colace (over the counter) 100 mg by mouth twice a day and/or Miralax (over the counter) for constipation as needed.  Drink plenty of fluids (prune juice may be helpful) and high fiber foods.  Itching:  If you experience itching  with your medications, try taking only a single pain pill, or even half a pain pill at a time.  You can also use benadryl over the counter for itching or also to help with sleep.  Precautions:  If you experience chest pain or shortness of breath - call 911 immediately for transfer to the hospital emergency department!! Medications: Please contact the clinic during office hours (Monday through Friday, 0800 to 1600) if you need a refill on any medications.  Please monitor medications and allow 24 to 48 hours to process refill request!!!!  Please note that only medications directly related to the surgery can be prescribed.  For other medications (e.g. blood pressure medicines, sleeping medicines, etc.), please contact the prescribing physician or your primary care provider. DVT Prophylaxis: 81 mg aspirin twice daily for 2 weeks  If you develop a fever greater that 101.1 deg F, purulent drainage from wound, increased redness or drainage from wound, or calf pain -- Call the office at [phone].

## 2022-06-03 DIAGNOSIS — I25708 Atherosclerosis of coronary artery bypass graft(s), unspecified, with other forms of angina pectoris: Secondary | ICD-10-CM

## 2022-06-03 DIAGNOSIS — S82892A Other fracture of left lower leg, initial encounter for closed fracture: Secondary | ICD-10-CM

## 2022-06-03 DIAGNOSIS — N186 End stage renal disease: Secondary | ICD-10-CM | POA: Diagnosis not present

## 2022-06-03 DIAGNOSIS — Z95810 Presence of automatic (implantable) cardiac defibrillator: Secondary | ICD-10-CM | POA: Diagnosis not present

## 2022-06-03 DIAGNOSIS — I255 Ischemic cardiomyopathy: Secondary | ICD-10-CM | POA: Diagnosis not present

## 2022-06-03 LAB — RENAL FUNCTION PANEL
Albumin: 2.8 g/dL — ABNORMAL LOW (ref 3.5–5.0)
Anion gap: 20 — ABNORMAL HIGH (ref 5–15)
BUN: 28 mg/dL — ABNORMAL HIGH (ref 8–23)
CO2: 27 mmol/L (ref 22–32)
Calcium: 9.4 mg/dL (ref 8.9–10.3)
Chloride: 90 mmol/L — ABNORMAL LOW (ref 98–111)
Creatinine, Ser: 5.32 mg/dL — ABNORMAL HIGH (ref 0.61–1.24)
GFR, Estimated: 10 mL/min — ABNORMAL LOW (ref >60)
Glucose, Bld: 113 mg/dL — ABNORMAL HIGH (ref 70–99)
Phosphorus: 4.9 mg/dL — ABNORMAL HIGH (ref 2.5–4.6)
Potassium: 4.3 mmol/L (ref 3.5–5.1)
Sodium: 137 mmol/L (ref 135–145)

## 2022-06-03 MED ORDER — HYDROCODONE-ACETAMINOPHEN 5-325 MG PO TABS: 1.0000 | ORAL_TABLET | Freq: Four times a day (QID) | ORAL | 0 refills | Status: DC | PRN

## 2022-06-03 MED ORDER — SEVELAMER CARBONATE 800 MG PO TABS: 1600.0000 mg | ORAL_TABLET | ORAL | Status: DC | PRN

## 2022-06-03 MED ORDER — MORPHINE SULFATE (PF) 2 MG/ML IV SOLN: 1.0000 mg | INTRAVENOUS | Status: DC | PRN

## 2022-06-03 MED ORDER — HYDROMORPHONE HCL 1 MG/ML IJ SOLN
0.2500 mg | INTRAMUSCULAR | Status: DC | PRN
  Filled 2022-06-03: qty 1

## 2022-06-03 NOTE — TOC Progression Note (Signed)
Transition of Care [facility]) - Initial/Assessment Note    Patient Details  Name: Rodney Farley MRN: [id number] Date of Birth: Sep 24, 1931  Transition of Care [facility]) CM/SW Contact:    Milinda Antis, [place] Phone Number: 06/03/2022, 1:35 PM  Clinical Narrative:                 CSW received consult for possible SNF placement at time of discharge. CSW spoke with patient. Patient reported that he is from home alone and expressed understanding of PT recommendation and is agreeable to SNF placement at time of discharge. Patient reports preference for [facility]. CSW discussed insurance authorization process and will provide Medicare SNF ratings list. CSW will send out referrals for review and provide bed offers as available.   Skilled Nursing Rehab Facilities-   [email or web address]   Ratings out of 5 stars (5 the highest)   Name Address  Phone # [place] Inspection Overall  [facility] [address] [place] 4 5 2 3   [facility]  [address], [place] [phone] 4 2 5 5   [facility] [place], [place] 1 3 1 1   [place], [place] 2 2 4 4   [facility] [address], [place] 2 1 2 1   [place]. [place] 3 3 4 4   [facility] [address], [place] 4 1 3 2   [facility] [address], [place] 4 1 3 2   [address] ([place]) [place], [address] [phone] 3 1 2 1   [facility] [address], [facility] [phone] 3 1 1 1   [facility] [address] [facility] [phone] 3 2 2 2   [facility] ([place]) [place]. [place], [address] [phone] 3 1 1 1   [facility] [address] Dustin Flock [place] 4 2 4 4           [place] 4 1 3 2   [facility] [place] [address], [place] 3 1 5 4   [facility], [place], [place] [phone] 1 1 2 1   [facility] [address], [place] [phone] 2 2 4 4           [address] (no [facility]) [place] [address], [place] [phone] 5 5 5 5   [facility] (No Humana) [address], [place] 4 1 4 3   [place] (No UHC) [place], [place] 5 5 5 5   [facility] [address], [address] [phone] 2 3 5 5   [place] [address], [place] 1 1 2 1   [place] [address], [address] 3 1 1 1   [place], [place] 5 2 5 5   [facility]  [address] [place] 2 2 1 1   [facility] [address] [place] 3 2 1 1   [facility] [place], [place] 2 2 2 2           [facility] [address] [place] [phone] 1 1 1 1   [place] [address], [address]  [phone] 2 4 3 3   [facility] [place] [address], [place] [phone] 3 2 3 3   [facility] [place], [place] 2 1 1 1   [place] (No Humana) [address], [place] [phone] 2 2 3 3   [facility]) [address], Tia [place] [phone] 2 1 1 1           [facility] [place], [place] 5 4 5 5   [facility])  [address], [place] 2 1 2 1   [facility]) [name] [address] [place] 3 1 4 3   [place] [address], [place] 3 3 4 4   [address] [place], [place] 2 3 1 1   [facility]) [place] [phone] 2 1 4 3        Barriers to Discharge: Continued Medical Work up   Patient Goals and CMS Choice Patient states their goals for this hospitalization and ongoing recovery are:: To return home CMS Medicare.gov Compare Post Acute Care list provided to:: Patient    Expected Discharge Plan and Services     Discharge Planning Services: CM Consult Post Acute Care Choice: Resumption of Svcs/PTA Provider ([place]) Living arrangements for the past 2 months: [place]: PT, OT HH Agency: [place] Date [place]: 06/01/22 Time [place]: 1520 Representative spoke with at [place]: Claiborne Billings  Prior Living Arrangements/Services Living arrangements for the past 2 months: [place] with:: Self Patient language and need for interpreter reviewed:: Yes Do you feel safe going back to the place where you live?: Yes      Need for Family Participation in Patient Care: Yes (Comment) Care giver support system in place?: Yes (comment) Current home services: DME (All necessary DME's) Criminal Activity/Legal Involvement Pertinent to Current Situation/Hospitalization: No - Comment as needed  Activities of Daily Living Home Assistive Devices/Equipment: Eyeglasses, Hearing aid ADL Screening (condition at time of admission) Patient's cognitive ability adequate to safely complete daily activities?: Yes Is the patient deaf or have difficulty hearing?: Yes Does the patient have difficulty seeing, even when wearing glasses/contacts?: Yes Does the patient have difficulty concentrating, remembering, or making decisions?: No Patient able to express need for assistance with ADLs?: Yes Does the patient have difficulty dressing or bathing?: Yes Independently performs ADLs?: Yes (appropriate for developmental age) Does the patient have difficulty walking or climbing stairs?: Yes Weakness of Legs: Both Weakness of Arms/Hands:  None  Permission Sought/Granted Permission sought to share information with : Case Manager, Customer service manager, Family Supports Permission granted to share information with : Yes, Verbal Permission Granted              Emotional Assessment Appearance:: Appears stated age Attitude/Demeanor/Rapport: Engaged, Gracious Affect (typically observed): Accepting, Calm, Hopeful, Pleasant Orientation: : Oriented to Self, Oriented to Place, Oriented to  Time, Oriented to Situation Alcohol / Substance Use: Not Applicable Psych Involvement: No (comment)  Admission diagnosis:  ESRD on dialysis ([place]) [N18.6, Z99.2] Closed left ankle fracture [S82.892A] Closed fracture of left ankle, initial encounter [S82.892A] Accidental fall, initial encounter [W19.XXXA] Patient Active Problem List   Diagnosis Date Noted   Closed left ankle fracture 05/29/2022   Accidental fall 05/29/2022   S/P coronary artery stent placement 05/29/2022   Tremor 00/34/9179   Chronic systolic CHF (congestive heart  failure) ([place]) 01/12/2022   Aortic stenosis 12/24/2021   Hypotension 12/24/2021   History of TIA (transient ischemic attack)    Atrial fibrillation ([place]) - SCAF 08/26/2021   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 07/29/2020   Thrombocytopenia ([place]) 04/13/2019   Hypokalemia 04/12/2019   Anaphylactic shock, unspecified, initial encounter 04/10/2019   Anemia in chronic kidney disease 04/10/2019   Iron deficiency anemia, unspecified 04/10/2019   Secondary hyperparathyroidism of renal origin ([place]) 04/10/2019   Hyperlipidemia 04/13/2018   S/P cholecystectomy 10/13/2017   Diarrhea 05/11/2017   Marital stress 02/18/2017   BPH associated with nocturia 04/27/2016   Diabetic peripheral neuropathy (HCC)    COPD (chronic obstructive pulmonary disease) ([place])    Bell's palsy    Gout 06/26/2015   Diverticulitis 04/08/2015   DOE (dyspnea on exertion), due to significant CAD 03/28/2015    ESRD on hemodialysis ([place]) 03/04/2015   ESRD on dialysis ([place]) 03/04/2015   Coronary artery 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 [place] [address] Phone: [phone] Fax: [phone]     Social Determinants of Health (SDOH) Interventions    Readmission Risk Interventions    05/08/2022    4:03 PM 04/02/2022    3:35 PM  Readmission Risk Prevention Plan  Transportation Screening Complete Complete  Medication Review ([place]) Complete Complete  PCP or Specialist appointment within 3-5 days of discharge  Complete  HRI or [place] Complete Complete  SW Recovery Care/Counseling Consult Complete Complete  Palliative Care Screening Not Applicable Not [place] Not Applicable Not Applicable

## 2022-06-03 NOTE — Progress Notes (Signed)
Physical Therapy Treatment Patient Details Name: Rodney Farley MRN: [id number] DOB: 01-04-1932 Today's Date: 06/03/2022   History of Present Illness Rodney Farley slipped on some papers in his apartment and fell to the floor, sustaining a L ankle fx, NWB; Now s/p ORIF L ankle10/21; PMH of extensive cardiac history including CAD, NSTEMI, multiple PCI's including most recently on 05/07/2022 when he had PCI with balloon angioplasty for recurrent in-stent stenosis of LAD, ischemic cardiomyopathy, chronic systolic CHF with most recent LVEF of 45-50% on 11/11/2021, complete heart block s/p PPM which was upgraded to ICD-CRT in 2017, subclinical atrial fibrillation not on anticoagulation, moderate aortic stenosis, chronic hypotension on midodrine, HLD, DM 2, ESRD on HD MWF, OSA, COPD, TIA presented to the ED on 05/29/2022 following a mechanical fall at home the night prior followed by severe pain in his left ankle.  Diagnosed with left ankle fracture.  Orthopedics consulted and plan surgical intervention on 11/21 at noon.  Cardiology consulted and provided preop clearance.    PT Comments    Continuing work on functional mobility and activity tolerance;  Much better activity tolerance post surgical fixation of L ankle fx; Pt able to get up to EOB with mod assist of 2, and began practicing sit <>stands and lateral scooting (along EOB); Spent majority of session sitting EOB; as time passed sitting up, noted pt seeming considerably more fatigued -- BP 100/44, MAP 61, HR 82; Once back supine, BP 124/53, MAP 73, HR 81; RN notified;   Rodney Farley has been at a SNF for rehab before, with good functional outcomes; He agrees to going to post-acute rehab to maximize independence and safety with mobility and ADLs.  Recommendations for follow up therapy are one component of a multi-disciplinary discharge planning process, led by the attending physician.  Recommendations may be updated based on patient status, additional functional  criteria and insurance authorization.  Follow Up Recommendations  Skilled nursing-short term rehab (<3 hours/day) Can patient physically be transported by private vehicle: No   Assistance Recommended at Discharge Frequent or constant Supervision/Assistance  Patient can return home with the following Two people to help with walking and/or transfers;A lot of help with bathing/dressing/bathroom;Assistance with cooking/housework;Assist for transportation   Equipment Recommendations  Wheelchair (measurements PT);Wheelchair cushion (measurements PT)    Recommendations for Other Services       Precautions / Restrictions Precautions Precautions: Fall Precaution Comments: Premedicate for pain Restrictions LLE Weight Bearing: Non weight bearing     Mobility  Bed Mobility Overal bed mobility: Needs Assistance Bed Mobility: Supine to Sit, Sit to Supine     Supine to sit: Mod assist, +2 for physical assistance, +2 for safety/equipment, HOB elevated Sit to supine: Min assist, +2 for safety/equipment   General bed mobility comments: Good initiation of pushing up; cues for technique, and up to mod assist and use of pad to help move hips to EOB and square off at EOB; mod assist to help LLE off of bed and to floor slowly; min assist to help LLE onto bed    Transfers Overall transfer level: Needs assistance Equipment used: Rolling walker (2 wheels), 2 person hand held assist Transfers: Sit to/from Stand, Bed to chair/wheelchair/BSC Sit to Stand: Min assist, +2 physical assistance, +2 safety/equipment          Lateral/Scoot Transfers: Min assist, Mod assist, +2 safety/equipment General transfer comment: Practiced sit<>stands from low bed adn elevated bed; pt nervous about falling, and we began with addressing weight shift, hand placement, No weight LLE for  just push off for a few reps to allow for pt to get more comfortable with moving; Able to get to half stand with 2 person min assist with  good NWB; Before laying back down, pt performed simulated lateral scoots to [facility] (to the R)    Ambulation/Gait                   Stairs             Wheelchair Mobility    Modified Rankin (Stroke Patients Only)       Balance                                            Cognition Arousal/Alertness: Awake/alert Behavior During Therapy: WFL for tasks assessed/performed Overall Cognitive Status: Within Functional Limits for tasks assessed                                 General Comments: Anxious, fearful of falling, but participating        Exercises      General Comments General comments (skin integrity, edema, etc.): Spent majority of session sitting EOB; as time passed sitting up, noted pt seeming considerably more fatigued -- BP 100/44, MAP 61, HR 82; Once back supine, BP 124/53, MAP 73, HR 81      Pertinent Vitals/Pain Pain Assessment Pain Assessment: Faces Faces Pain Scale: Hurts little more Pain Location: L ankle, occasionally grimaces with movement, seems to ease back down quickly Pain Descriptors / Indicators: Grimacing, Guarding Pain Intervention(s): Monitored during session, Premedicated before session, Repositioned    Home Living                          Prior Function            PT Goals (current goals can now be found in the care plan section) Acute Rehab PT Goals Patient Stated Goal: independence PT Goal Formulation: With patient Time For Goal Achievement: 06/15/22 Potential to Achieve Goals: Good Progress towards PT goals: Progressing toward goals    Frequency    Min 2X/week      PT Plan Current plan remains appropriate    Co-evaluation              AM-PAC PT "6 Clicks" Mobility   Outcome Measure  Help needed turning from your back to your side while in a flat bed without using bedrails?: A Little Help needed moving from lying on your back to sitting on the side of a flat  bed without using bedrails?: A Lot Help needed moving to and from a bed to a chair (including a wheelchair)?: A Lot Help needed standing up from a chair using your arms (e.g., wheelchair or bedside chair)?: Total Help needed to walk in hospital room?: Total Help needed climbing 3-5 steps with a railing? : Total 6 Click Score: 10    End of Session Equipment Utilized During Treatment: Gait belt Activity Tolerance: Patient tolerated treatment well;Patient limited by fatigue Patient left: in bed;with call bell/phone within reach;with bed alarm set Nurse Communication: Mobility status;Other (comment) (BPS sitting and lying) PT Visit Diagnosis: Other abnormalities of gait and mobility (R26.89);Pain Pain - Right/Left: Left Pain - part of body: Leg;Ankle and joints of foot     Time:  2244-9753 PT Time Calculation (min) (ACUTE ONLY): 46 min  Charges:  $Therapeutic Activity: 23-37 mins                     [name], PT  Acute Rehabilitation Services Office [phone]    [place] 06/03/2022, 1:17 PM

## 2022-06-03 NOTE — Progress Notes (Signed)
PROGRESS NOTE    Rodney Farley  NOB:[id number] DOB: [birth year] DOA: 05/28/2022 PCP: [name], MD    Brief Narrative:  88 year old from home with extensive cardiac history including coronary artery disease, non-STEMI and multiple PCI's with recent PCI on 05/07/2022, recurrent in-stent restenosis of LAD, ischemic cardiomyopathy, chronic combined heart failure, complete heart block status post ICD CRT, ESRD on hemodialysis and obstructive sleep apnea presented to the emergency room on 11/17 following a mechanical fall at home the night prior and severe pain in his left ankle.  Patient was found to have left ankle fracture and admitted to the hospital.  After medically optimizing, patient underwent ORIF left ankle 11/21.   Assessment & Plan:   Closed traumatic fracture and dislocation of the left ankle: ORIF 11/20 Dr. Mable Fill Nonweightbearing left lower extremity DVT prophylaxis with SCDs and subcu heparin Adequate IV and oral opiates for pain relief, mobilize with PT OT.  Discontinue morphine.  Start Dilaudid. Anticipate rehab at a skilled nursing facility.  ESRD on hemodialysis Chronic hypotension on midodrine with dialysis Chronic combined heart failure and ischemic cardiomyopathy status post AICD Coronary artery disease and multiple PCI's with recent PCI:  Chronic and stable.  Receiving dialysis on his schedule and followed by nephrology. Blood pressures are stable. Euvolemic and fluid balance maintained with hemodialysis. Recent LAD in-stent restenosis status post PCI on aspirin, Brilinta and a statin.  Patient is on atorvastatin and Zetia.  Repatha as outpatient.  Untreated sleep apnea.  Medically stabilizing.  Transfer to SNF when bed available.    DVT prophylaxis: SCDs Start: 06/02/22 1842 heparin injection 5,000 Units Start: 05/29/22 0915   Code Status: DNR with full scope of treatment.  He has documentations in the chart. Family Communication: None  today. Disposition Plan: Status is: Inpatient Remains inpatient appropriate because: Pain control, rehab availability     Consultants:  Nephrology Orthopedics Cardiology  Procedures:  ORIF left ankle 11/21  Antimicrobials:  Perioperative   Subjective: Patient seen and examined.  Patient was quite upset about inadequate pain management.  He tells me that he can ask for IV morphine at nursing home.  He tells me he is going to [place] but not today.  No other overnight events.  He was proud of how he did pretty well with physical therapy.  Objective: Vitals:   06/02/22 1931 06/03/22 0032 06/03/22 0505 06/03/22 0837  BP: (!) 114/56 (!) 102/46 (!) 101/44 (!) 103/38  Pulse: 80 63 67 80  Resp: 18 18 16 16   Temp: 97.8 F (36.6 C) 98 F (36.7 C) 98.3 F (36.8 C) 98.9 F (37.2 C)  TempSrc: Oral Oral Oral Oral  SpO2: 100% 100% 97% 96%  Weight:      Height:        Intake/Output Summary (Last 24 hours) at 06/03/2022 1336 Last data filed at 06/03/2022 0900 Gross per 24 hour  Intake 520 ml  Output 10 ml  Net 510 ml   Filed Weights   06/01/22 2003 06/02/22 0801 06/02/22 1221  Weight: 93.3 kg 92 kg 90.1 kg    Examination:  General exam: Appears calm , interactive.  Upset about pain medication. Respiratory system: Clear to auscultation. Respiratory effort normal.  No added sounds. Cardiovascular system: S1 & S2 heard, RRR.  Gastrointestinal system: Abdomen is nondistended, soft and nontender. No organomegaly or masses felt. Normal bowel sounds heard. Central nervous system: Alert and oriented. No focal neurological deficits. Extremities: Symmetric 5 x 5 power.  AV fistula right arm.  Left foot on immediate postop dressing, short leg cast.  Toes are pink.   Data Reviewed: I have personally reviewed following labs and imaging studies  CBC: Recent Labs  Lab 05/28/22 2357 05/30/22 0219 06/01/22 0248 06/02/22 1600  WBC 5.0 6.1 9.4  --   NEUTROABS 2.8  --   --   --    HGB 11.7* 9.3* 10.3* 11.9*  HCT 35.7* 27.3* 31.3* 35.0*  MCV 106.3* 103.8* 103.6*  --   PLT 120* 120* 174  --    Basic Metabolic Panel: Recent Labs  Lab 05/30/22 0219 05/31/22 0250 06/01/22 0248 06/02/22 0425 06/02/22 1600 06/03/22 0344  NA 138 135 137 136 134* 137  K 5.3* 4.8 4.3 4.6 4.2 4.3  CL 96* 92* 96* 90* 93* 90*  CO2 30 30 29 27   --  27  GLUCOSE 99 156* 101* 104* 114* 113*  BUN 35* 22 18 39* 19 28*  CREATININE 8.80* 6.28* 5.00* 7.37* 4.30* 5.32*  CALCIUM 8.6* 8.7* 9.3 9.4  --  9.4  PHOS 5.2* 4.0 4.4 5.1*  --  4.9*   GFR: Estimated Creatinine Clearance: 10.3 mL/min (A) (by C-G formula based on SCr of 5.32 mg/dL (H)). Liver Function Tests: Recent Labs  Lab 05/30/22 0219 05/31/22 0250 06/01/22 0248 06/02/22 0425 06/03/22 0344  ALBUMIN 2.9* 3.0* 3.1* 2.9* 2.8*   No results for input(s): "LIPASE", "AMYLASE" in the last 168 hours. No results for input(s): "AMMONIA" in the last 168 hours. Coagulation Profile: No results for input(s): "INR", "PROTIME" in the last 168 hours. Cardiac Enzymes: No results for input(s): "CKTOTAL", "CKMB", "CKMBINDEX", "TROPONINI" in the last 168 hours. BNP (last 3 results) No results for input(s): "PROBNP" in the last 8760 hours. HbA1C: No results for input(s): "HGBA1C" in the last 72 hours. CBG: Recent Labs  Lab 06/02/22 1802  GLUCAP 115*   Lipid Profile: No results for input(s): "CHOL", "HDL", "LDLCALC", "TRIG", "CHOLHDL", "LDLDIRECT" in the last 72 hours. Thyroid Function Tests: No results for input(s): "TSH", "T4TOTAL", "FREET4", "T3FREE", "THYROIDAB" in the last 72 hours. Anemia Panel: No results for input(s): "VITAMINB12", "FOLATE", "FERRITIN", "TIBC", "IRON", "RETICCTPCT" in the last 72 hours. Sepsis Labs: No results for input(s): "PROCALCITON", "LATICACIDVEN" in the last 168 hours.  No results found for this or any previous visit (from the past 240 hour(s)).       Radiology Studies: DG Ankle Complete  Left  Result Date: 06/02/2022 CLINICAL DATA:  [id number] Postop check [id number] EXAM: LEFT ANKLE COMPLETE - 3+ VIEW COMPARISON:  Intraoperative radiographs left foot 06/02/2022, x-ray left ankle 05/29/2022 FINDINGS: Cast overlies foot. Intramedullary nail fixation of the mid to distal fibula. Metallic density along the medial malleolus likely related to ligament repair. There is no evidence of new fracture, dislocation, or joint effusion. There is no evidence of arthropathy or other focal bone abnormality. Posterior calcaneal spur. Soft tissues are unremarkable. IMPRESSION: Intramedullary nail fixation of the mid to distal fibula. Metallic density along the medial malleolus likely related to ligament repair. Electronically Signed   By: Iven Finn M.D.   On: 06/02/2022 20:04   DG Ankle 2 Views Left  Result Date: 06/02/2022 CLINICAL DATA:  ORIF left lateral malleolar fracture EXAM: LEFT ANKLE - 2 VIEW COMPARISON:  Left ankle x-ray 05/29/2022 FINDINGS: Intraoperative left ankle. Three low resolution intraoperative spot views of the left ankle were obtained. There is a new distal fibular intramedullary nail and screws. There is also new fixation devices crossing the distal tibia and fibula lateral ray. Alignment  is anatomic. There is soft tissue swelling surrounding the ankle. Medial malleolar tip fracture again noted. Total fluoroscopy time: 49 seconds Total radiation dose: 1.63 micro Gy IMPRESSION: 1. Intraoperative fluoroscopy for ORIF of distal fibular fracture. Alignment is anatomic. 2. Medial malleolar tip fracture again noted. Electronically Signed   By: Ronney Asters M.D.   On: 06/02/2022 17:49   DG C-Arm 1-60 Min-No Report  Result Date: 06/02/2022 Fluoroscopy was utilized by the requesting physician.  No radiographic interpretation.        Scheduled Meds:  (feeding supplement) PROSource Plus  30 mL Oral BID BM   acetaminophen  500 mg Oral Q6H   acidophilus  1 capsule Oral Daily   aspirin EC   81 mg Oral BID   atorvastatin  80 mg Oral QHS   calcitRIOL  1.75 mcg Oral Q M,W,F-HD   [START ON 06/06/2022] carvedilol  3.125 mg Oral 2 times per day on Sun Tue Thu Sat   [START ON 06/05/2022] carvedilol  3.125 mg Oral Once per day on Mon Wed Fri   carvedilol  3.125 mg Oral 2 times per day on Mon Wed Thu   Chlorhexidine Gluconate Cloth  6 each Topical Q0600   cyanocobalamin  1,000 mcg Oral Daily   darbepoetin (ARANESP) injection - DIALYSIS  100 mcg Subcutaneous Q Tue-1800   docusate sodium  100 mg Oral BID   ezetimibe  10 mg Oral Daily   gabapentin  100 mg Oral BID   Followed by   Derrill Memo ON 06/05/2022] gabapentin  100 mg Oral QHS   heparin  5,000 Units Subcutaneous Q8H   isosorbide mononitrate  15 mg Oral Daily   melatonin  3 mg Oral QHS   midodrine  10 mg Oral Q M,W,F   midodrine  10 mg Oral Once in dialysis   multivitamin  1 tablet Oral Daily   pantoprazole  40 mg Oral Daily   senna  1 tablet Oral QHS   sevelamer carbonate  2,400 mg Oral BID WC   ticagrelor  90 mg Oral BID   Continuous Infusions:   ceFAZolin (ANCEF) IV       LOS: 5 days    Time spent: 35 minutes    Barb Merino, MD [facility] Pager [phone]

## 2022-06-03 NOTE — Progress Notes (Signed)
[facility] Progress Note   Subjective:  Seen in room - no CP/dyspnea. C/o ankle pain ORIF yesterday. Spoke to RN to see if he is due for pain meds.  Objective Vitals:   06/02/22 1931 06/03/22 0032 06/03/22 0505 06/03/22 0837  BP: (!) 114/56 (!) 102/46 (!) 101/44 (!) 103/38  Pulse: 80 63 67 80  Resp: 18 18 16 16   Temp: 97.8 F (36.6 C) 98 F (36.7 C) 98.3 F (36.8 C) 98.9 F (37.2 C)  TempSrc: Oral Oral Oral Oral  SpO2: 100% 100% 97% 96%  Weight:      Height:       Physical Exam General: Elderly man, NAD. Room air. Heart: RRR; 2/6 murmur Lungs: CTA anteriorly Abdomen: soft, non-tender Extremities: L ankle in large ACE wrap, no RLE edema Dialysis Access: RUE AVG + thrill/bruit, + bruising  Additional Objective Labs: Basic Metabolic Panel: Recent Labs  Lab 06/01/22 0248 06/02/22 0425 06/02/22 1600 06/03/22 0344  NA 137 136 134* 137  K 4.3 4.6 4.2 4.3  CL 96* 90* 93* 90*  CO2 29 27  --  27  GLUCOSE 101* 104* 114* 113*  BUN 18 39* 19 28*  CREATININE 5.00* 7.37* 4.30* 5.32*  CALCIUM 9.3 9.4  --  9.4  PHOS 4.4 5.1*  --  4.9*   Liver Function Tests: Recent Labs  Lab 06/01/22 0248 06/02/22 0425 06/03/22 0344  ALBUMIN 3.1* 2.9* 2.8*   CBC: Recent Labs  Lab 05/28/22 2357 05/30/22 0219 06/01/22 0248 06/02/22 1600  WBC 5.0 6.1 9.4  --   NEUTROABS 2.8  --   --   --   HGB 11.7* 9.3* 10.3* 11.9*  HCT 35.7* 27.3* 31.3* 35.0*  MCV 106.3* 103.8* 103.6*  --   PLT 120* 120* 174  --    Studies/Results: DG Ankle Complete Left  Result Date: 06/02/2022 CLINICAL DATA:  [id number] Postop check [id number] EXAM: LEFT ANKLE COMPLETE - 3+ VIEW COMPARISON:  Intraoperative radiographs left foot 06/02/2022, x-ray left ankle 05/29/2022 FINDINGS: Cast overlies foot. Intramedullary nail fixation of the mid to distal fibula. Metallic density along the medial malleolus likely related to ligament repair. There is no evidence of new fracture, dislocation, or joint effusion.  There is no evidence of arthropathy or other focal bone abnormality. Posterior calcaneal spur. Soft tissues are unremarkable. IMPRESSION: Intramedullary nail fixation of the mid to distal fibula. Metallic density along the medial malleolus likely related to ligament repair. Electronically Signed   By: Iven Finn M.D.   On: 06/02/2022 20:04   DG Ankle 2 Views Left  Result Date: 06/02/2022 CLINICAL DATA:  ORIF left lateral malleolar fracture EXAM: LEFT ANKLE - 2 VIEW COMPARISON:  Left ankle x-ray 05/29/2022 FINDINGS: Intraoperative left ankle. Three low resolution intraoperative spot views of the left ankle were obtained. There is a new distal fibular intramedullary nail and screws. There is also new fixation devices crossing the distal tibia and fibula lateral ray. Alignment is anatomic. There is soft tissue swelling surrounding the ankle. Medial malleolar tip fracture again noted. Total fluoroscopy time: 49 seconds Total radiation dose: 1.63 micro Gy IMPRESSION: 1. Intraoperative fluoroscopy for ORIF of distal fibular fracture. Alignment is anatomic. 2. Medial malleolar tip fracture again noted. Electronically Signed   By: Ronney Asters M.D.   On: 06/02/2022 17:49   DG C-Arm 1-60 Min-No Report  Result Date: 06/02/2022 Fluoroscopy was utilized by the requesting physician.  No radiographic interpretation.   Medications:   ceFAZolin (ANCEF) IV      (  feeding supplement) PROSource Plus  30 mL Oral BID BM   acetaminophen  500 mg Oral Q6H   acidophilus  1 capsule Oral Daily   aspirin EC  81 mg Oral BID   atorvastatin  80 mg Oral QHS   calcitRIOL  1.75 mcg Oral Q M,W,F-HD   [START ON 06/06/2022] carvedilol  3.125 mg Oral 2 times per day on Sun Tue Thu Sat   [START ON 06/05/2022] carvedilol  3.125 mg Oral Once per day on Mon Wed Fri   carvedilol  3.125 mg Oral 2 times per day on Mon Wed Thu   Chlorhexidine Gluconate Cloth  6 each Topical Q0600   cyanocobalamin  1,000 mcg Oral Daily   darbepoetin  (ARANESP) injection - DIALYSIS  100 mcg Subcutaneous Q Tue-1800   docusate sodium  100 mg Oral BID   ezetimibe  10 mg Oral Daily   gabapentin  100 mg Oral BID   Followed by   Derrill Memo ON 06/05/2022] gabapentin  100 mg Oral QHS   heparin  5,000 Units Subcutaneous Q8H   isosorbide mononitrate  15 mg Oral Daily   melatonin  3 mg Oral QHS   midodrine  10 mg Oral Q M,W,F   midodrine  10 mg Oral Once in dialysis   multivitamin  1 tablet Oral Daily   pantoprazole  40 mg Oral Daily   senna  1 tablet Oral QHS   sevelamer carbonate  2,400 mg Oral BID WC   ticagrelor  90 mg Oral BID    Dialysis Orders: MWF [facility]  4h   400/500  94kg  2/2 bath  Hep 2500+ 1079midrun  RUA AVG - last HD 11/15, post 95.4kg - calcitriol 1.75 ug po tiw - mircera 10mcg q4, last 11/6   Assessment/Plan: L ankle fracture: S/p ORIF 11/21 - pain control per primary. ESRD: Usual MWF schedule - following Thanksgiving Holiday schedule this week: Sun/Tues/Fri. Next HD Friday 11/24. Hypertension/volume: On midodrine pre-HD (as well as BB/iso). He does not appear overloaded at this time.   HFrEF - EF 45-50% CAD/multiple PCI/stent CHB s/p PPM Anemia of ESRD: Hgb 11.9 - Aranesp 165mcg started q Tuesday - await next Hgb to see if had post-op drop. May need to hold the ESA if Hgb remains > 11. Secondary HPTH: Ca/Phos ok. Continue binders + VDRA DM2 - on insulin, per pmd Nutrition - Renal diet w/fluid restrictions, continue supplements.  Veneta Penton, PA-C 06/03/2022, 10:26 AM  [employer]

## 2022-06-03 NOTE — NC FL2 (Signed)
[place] LEVEL OF CARE SCREENING TOOL     IDENTIFICATION  Patient Name: Rodney Farley Birthdate: 1932-02-04 Sex: male Admission Date (Current Location): 05/28/2022  [facility] and [place] Number:  Herbalist and Address:  [facility] [place] [address] [place], [place] [address]      Provider Number: [id number]  Attending Physician Name and Address:  Barb Merino, MD  Relative Name and Phone Number:  [place], [place] [phone]) [phone]    Current Level of Care: Hospital Recommended Level of Care: [place] Prior Approval Number:    Date Approved/Denied:   PASRR Number: [id number]  Discharge Plan: SNF    Current Diagnoses: Patient Active Problem List   Diagnosis Date Noted   Closed left ankle fracture 05/29/2022   Accidental fall 05/29/2022   S/P coronary artery stent placement 05/29/2022   Tremor 23/30/0762   Chronic systolic CHF (congestive heart failure) ([place]) 01/12/2022   Aortic stenosis 12/24/2021   Hypotension 12/24/2021   History of TIA (transient ischemic attack)    Atrial fibrillation ([place]) - SCAF 08/26/2021   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 07/29/2020   Thrombocytopenia ([place]) 04/13/2019   Hypokalemia 04/12/2019   Anaphylactic shock, unspecified, initial encounter 04/10/2019   Anemia in chronic kidney disease 04/10/2019   Iron deficiency anemia, unspecified 04/10/2019   Secondary hyperparathyroidism of renal origin ([place]) 04/10/2019   Hyperlipidemia 04/13/2018   S/P cholecystectomy 10/13/2017   Diarrhea 05/11/2017   Marital stress 02/18/2017   BPH associated with nocturia 04/27/2016   Diabetic peripheral neuropathy ([place])    COPD (chronic obstructive pulmonary disease) ([place])    Bell's palsy    Gout 06/26/2015   Diverticulitis 04/08/2015   DOE (dyspnea on exertion), due to significant CAD 03/28/2015   ESRD on hemodialysis ([place]) 03/04/2015   ESRD on dialysis  ([place]) 03/04/2015   Coronary artery disease of bypass graft of native heart with stable angina pectoris ([place]) 09/13/2014   GERD (gastroesophageal reflux disease) 08/07/2012   Major depression, recurrent, full remission ([place]) 08/05/2012   Sleep apnea 08/05/2012   Type II diabetes mellitus ([place]) 08/05/2012   Essential hypertension 08/05/2012   Depression 08/05/2012   Ischemic cardiomyopathy 11/03/2011   Atrioventricular block, complete (HCC)    Obesity (BMI 30-39.9)    Pacemaker     Orientation RESPIRATION BLADDER Height & Weight     Self, Time, Situation, Place  Normal Continent Weight: 198 lb 10.2 oz (90.1 kg) Height:  5\' 7"  (170.2 cm)  BEHAVIORAL SYMPTOMS/MOOD NEUROLOGICAL BOWEL NUTRITION STATUS      Continent Diet (see d/c summary)  AMBULATORY STATUS COMMUNICATION OF NEEDS Skin   Extensive Assist Verbally Surgical wounds                       Personal Care Assistance Level of Assistance  Dressing, Feeding, Bathing Bathing Assistance: Limited assistance Feeding assistance: Independent Dressing Assistance: Limited assistance     Functional Limitations Info  Sight, Hearing, Speech Sight Info: Impaired Hearing Info: Impaired Speech Info: Adequate    SPECIAL CARE FACTORS FREQUENCY  PT (By licensed PT), OT (By licensed OT)     PT Frequency: 5x/ week OT Frequency: 5x/ week            Contractures Contractures Info: Not present    Additional Factors Info  Code Status, Allergies Code Status Info: Full Allergies Info: Bee Venom  Lyrica (Pregabalin)  Prednisone  Zocor (Simvastatin)  Current Medications (06/03/2022):  This is the current hospital active medication list Current Facility-Administered Medications  Medication Dose Route Frequency Provider Last Rate Last Admin   (feeding supplement) PROSource Plus liquid 30 mL  30 mL Oral BID BM Loren Racer, PA-C   30 mL at 06/03/22 0911   acetaminophen (TYLENOL) tablet 325-650 mg  325-650 mg Oral  Q6H PRN Georgeanna Harrison, MD       acetaminophen (TYLENOL) tablet 500 mg  500 mg Oral Q6H Georgeanna Harrison, MD   500 mg at 06/03/22 1152   acidophilus (RISAQUAD) capsule 1 capsule  1 capsule Oral Daily Fuller Plan A, MD   1 capsule at 06/03/22 0913   aspirin EC tablet 81 mg  81 mg Oral BID Georgeanna Harrison, MD   81 mg at 06/03/22 0913   atorvastatin (LIPITOR) tablet 80 mg  80 mg Oral QHS Smith, Rondell A, MD   80 mg at 06/02/22 2113   calcitRIOL (ROCALTROL) capsule 1.75 mcg  1.75 mcg Oral Q M,W,F-HD Norval Morton, MD       [START ON 06/06/2022] carvedilol (COREG) tablet 3.125 mg  3.125 mg Oral 2 times per day on Sun Tue Thu Sat Modena Jansky, MD       [START ON 06/05/2022] carvedilol (COREG) tablet 3.125 mg  3.125 mg Oral Once per day on Mon Wed Fri Hongalgi, Anand D, MD       carvedilol (COREG) tablet 3.125 mg  3.125 mg Oral 2 times per day on Mon Wed Thu Hongalgi, Anand D, MD       ceFAZolin (ANCEF) IVPB 2g/100 mL premix  2 g Intravenous Q24H Georgeanna Harrison, MD       Chlorhexidine Gluconate Cloth 2 % PADS 6 each  6 each Topical Q0600 Roney Jaffe, MD   6 each at 06/02/22 0534   cyanocobalamin (VITAMIN B12) tablet 1,000 mcg  1,000 mcg Oral Daily Fuller Plan A, MD   1,000 mcg at 06/03/22 0912   Darbepoetin Alfa (ARANESP) injection 100 mcg  100 mcg Subcutaneous Q Tue-1800 Loren Racer, PA-C   100 mcg at 06/02/22 2013   docusate sodium (COLACE) capsule 100 mg  100 mg Oral BID Georgeanna Harrison, MD   100 mg at 06/03/22 0913   ezetimibe (ZETIA) tablet 10 mg  10 mg Oral Daily Fuller Plan A, MD   10 mg at 06/03/22 0913   gabapentin (NEURONTIN) capsule 100 mg  100 mg Oral BID Fuller Plan A, MD   100 mg at 06/03/22 0913   Followed by   Derrill Memo ON 06/05/2022] gabapentin (NEURONTIN) capsule 100 mg  100 mg Oral QHS Smith, Rondell A, MD   100 mg at 05/31/22 2147   heparin injection 5,000 Units  5,000 Units Subcutaneous Q8H Smith, Rondell A, MD   5,000 Units at 06/03/22 0455    HYDROcodone-acetaminophen (NORCO) 7.5-325 MG per tablet 1-2 tablet  1-2 tablet Oral Q4H PRN Georgeanna Harrison, MD       HYDROcodone-acetaminophen (NORCO/VICODIN) 5-325 MG per tablet 1-2 tablet  1-2 tablet Oral Q4H PRN Georgeanna Harrison, MD   2 tablet at 06/03/22 0018   HYDROmorphone (DILAUDID) injection 0.25 mg  0.25 mg Intravenous Q4H PRN Barb Merino, MD       isosorbide mononitrate (IMDUR) 24 hr tablet 15 mg  15 mg Oral Daily Smith, Rondell A, MD   15 mg at 06/01/22 1040   melatonin tablet 3 mg  3 mg Oral QHS Smith, Rondell A, MD   3 mg  at 06/02/22 2116   methocarbamol (ROBAXIN) tablet 1,000 mg  1,000 mg Oral Q6H PRN Lisette Abu, PA-C       midodrine (PROAMATINE) tablet 10 mg  10 mg Oral Q M,W,F Smith, Rondell A, MD   10 mg at 06/03/22 0942   midodrine (PROAMATINE) tablet 10 mg  10 mg Oral Once in dialysis Stovall, Kathryn R, PA-C       multivitamin (RENA-VIT) tablet 1 tablet  1 tablet Oral Daily Fuller Plan A, MD   1 tablet at 06/03/22 0912   ondansetron (ZOFRAN) tablet 4 mg  4 mg Oral Q6H PRN Georgeanna Harrison, MD       Or   ondansetron [facility]) injection 4 mg  4 mg Intravenous Q6H PRN Georgeanna Harrison, MD       pantoprazole (PROTONIX) EC tablet 40 mg  40 mg Oral Daily Smith, Rondell A, MD   40 mg at 06/03/22 0912   polyvinyl alcohol (LIQUIFILM TEARS) 1.4 % ophthalmic solution 1 drop  1 drop Both Eyes PRN Norval Morton, MD       senna (SENOKOT) tablet 8.6 mg  1 tablet Oral QHS Smith, Rondell A, MD   8.6 mg at 06/02/22 2116   sevelamer carbonate (RENVELA) tablet 1,600 mg  1,600 mg Oral PRN Barb Merino, MD       sevelamer carbonate (RENVELA) tablet 2,400 mg  2,400 mg Oral BID WC Smith, Rondell A, MD   2,400 mg at 06/03/22 0911   ticagrelor (BRILINTA) tablet 90 mg  90 mg Oral BID Fuller Plan A, MD   90 mg at 06/03/22 4097     Discharge Medications: Please see discharge summary for a list of discharge medications.  Relevant Imaging Results:  Relevant Lab Results:   Additional  Information SSN: [id number];   HD at [facility] on MWF.  Pt arrives at 11:00 for 11:20 chair time  [employer], LCSWA

## 2022-06-03 NOTE — Anesthesia Postprocedure Evaluation (Signed)
Anesthesia Post Note  Patient: Rodney Farley  Procedure(s) Performed: OPEN REDUCTION INTERNAL FIXATION (ORIF) LEFT LATERAL MALLEOLUS (Left: Ankle)     Patient location during evaluation: PACU Anesthesia Type: Regional and MAC Level of consciousness: awake and alert Pain management: pain level controlled Vital Signs Assessment: post-procedure vital signs reviewed and stable Respiratory status: spontaneous breathing, nonlabored ventilation, respiratory function stable and patient connected to nasal cannula oxygen Cardiovascular status: stable and blood pressure returned to baseline Postop Assessment: no apparent nausea or vomiting Anesthetic complications: no  No notable events documented.  Last Vitals:  Vitals:   06/03/22 0505 06/03/22 0837  BP: (!) 101/44 (!) 103/38  Pulse: 67 80  Resp: 16 16  Temp: 36.8 C 37.2 C  SpO2: 97% 96%    Last Pain:  Vitals:   06/03/22 0837  TempSrc: Oral  PainSc:                  Zhane Donlan L Dyanara Cozza

## 2022-06-03 NOTE — Evaluation (Signed)
Occupational Therapy Evaluation Patient Details Name: Rodney Farley MRN: [id number] DOB: Aug 28, 1931 Today's Date: 06/03/2022   History of Present Illness Rodney Farley slipped on some papers in his apartment and fell to the floor, sustaining a L ankle fx, NWB; Plan for ORIF 10/21; PMH of extensive cardiac history including CAD, NSTEMI, multiple PCI's including most recently on 05/07/2022 when he had PCI with balloon angioplasty for recurrent in-stent stenosis of LAD, ischemic cardiomyopathy, chronic systolic CHF with most recent LVEF of 45-50% on 11/11/2021, complete heart block s/p PPM which was upgraded to ICD-CRT in 2017, subclinical atrial fibrillation not on anticoagulation, moderate aortic stenosis, chronic hypotension on midodrine, HLD, DM 2, ESRD on HD MWF, OSA, COPD, TIA presented to the ED on 05/29/2022 following a mechanical fall at home the night prior followed by severe pain in his left ankle.  Diagnosed with left ankle fracture.  Orthopedics consulted and plan surgical intervention on 11/21 at noon.  Cardiology consulted and provided preop clearance.   Clinical Impression   PTA, pt was independent in ADL and IADL. Upon eval, pt with fear of falling, pain, generalized weakness, decreased knowledge of precautions, and decreased activity tolerance. Performing UB ADL with set-up and LB ADL with up to mod A +2. Focusing session on beginning functional mobility with practicing weight shifts with chair push-ups/sit<>stands at EOB and lateral scooting while maintaining precautions. Per pt, he has been to SNF before for OT/PT services and is agreeable to additional rehabilitation prior to return home. Recommending SNF for continued OT services to optimize safety and independence in ADL and IADL.      Recommendations for follow up therapy are one component of a multi-disciplinary discharge planning process, led by the attending physician.  Recommendations may be updated based on patient status,  additional functional criteria and insurance authorization.   Follow Up Recommendations  Skilled nursing-short term rehab (<3 hours/day)     Assistance Recommended at Discharge Frequent or constant Supervision/Assistance  Patient can return home with the following Two people to help with walking and/or transfers;Two people to help with bathing/dressing/bathroom;Assistance with cooking/housework;Assist for transportation;Help with stairs or ramp for entrance    Functional Status Assessment  Patient has had a recent decline in their functional status and demonstrates the ability to make significant improvements in function in a reasonable and predictable amount of time.  Equipment Recommendations  Other (comment) (defer to next venue)    Recommendations for Other Services       Precautions / Restrictions Precautions Precautions: Fall Precaution Comments: Premedicate for pain Restrictions Weight Bearing Restrictions: Yes LLE Weight Bearing: Non weight bearing      Mobility Bed Mobility Overal bed mobility: Needs Assistance Bed Mobility: Supine to Sit, Sit to Supine     Supine to sit: Mod assist, +2 for physical assistance, +2 for safety/equipment, HOB elevated Sit to supine: Min assist, +2 for safety/equipment   General bed mobility comments: Good initiation of pushing up; cues for technique, and up to mod assist and use of pad to help move hips to EOB and square off at EOB; mod assist to help LLE off of bed and to floor slowly; min assist to help LLE onto bed    Transfers Overall transfer level: Needs assistance Equipment used: Rolling walker (2 wheels), 2 person hand held assist Transfers: Sit to/from Stand, Bed to chair/wheelchair/BSC Sit to Stand: Min assist, +2 physical assistance, +2 safety/equipment          Lateral/Scoot Transfers: Min assist, Mod assist, +2 safety/equipment  General transfer comment: Practiced sit<>stands from low bed and elevated bed; pt nervous  about falling, and we began with addressing weight shift, hand placement, No weight LLE for just push off for a few reps to allow for pt to get more comfortable with moving; Able to get to half stand with 2 person min assist with good NWB; Before laying back down, pt performed simulated lateral scoots to [facility] (to the R)      Balance                                           ADL either performed or assessed with clinical judgement   ADL Overall ADL's : Needs assistance/impaired Eating/Feeding: Modified independent;Bed level   Grooming: Set up;Sitting   Upper Body Bathing: Set up;Sitting   Lower Body Bathing: Moderate assistance;Sitting/lateral leans   Upper Body Dressing : Set up;Sitting   Lower Body Dressing: Moderate assistance;+2 for safety/equipment;+2 for physical assistance;Sit to/from stand   Toilet Transfer: Moderate assistance;+2 for physical assistance;+2 for safety/equipment Toilet Transfer Details (indicate cue type and reason): STS at this time and lateral scoots           General ADL Comments: limited by pain, fear, weakness, and decreased activity tolerance.     Vision Baseline Vision/History: 1 Wears glasses Ability to See in Adequate Light: 0 Adequate Patient Visual Report: No change from baseline Vision Assessment?: No apparent visual deficits     Perception     Praxis      Pertinent Vitals/Pain Pain Assessment Pain Assessment: Faces Faces Pain Scale: Hurts little more Pain Location: L ankle, occasionally grimaces with movement, seems to ease back down quickly Pain Descriptors / Indicators: Grimacing, Guarding Pain Intervention(s): Limited activity within patient's tolerance, Monitored during session, Repositioned, Premedicated before session     Hand Dominance Right   Extremity/Trunk Assessment Upper Extremity Assessment Upper Extremity Assessment: Generalized weakness   Lower Extremity Assessment Lower Extremity Assessment:  Defer to PT evaluation       Communication Communication Communication: HOH   Cognition Arousal/Alertness: Awake/alert Behavior During Therapy: WFL for tasks assessed/performed Overall Cognitive Status: No family/caregiver present to determine baseline cognitive functioning                                 General Comments: anxious and fearful of falling; following all one step commands. Believe pt is near baseline     General Comments  Spent majority of session sitting EOB; as time passed sitting up, noted pt seeming considerably more fatigued -- BP 100/44, MAP 61, HR 82; Once back supine, BP 124/53, MAP 73, HR 81    Exercises Exercises: Other exercises Other Exercises Other Exercises: chair push ups with bottom clearance ~8x Other Exercises: scooting towards [facility]   Shoulder Instructions      Home Living Family/patient expects to be discharged to:: Private residence Living Arrangements: Alone Available Help at Discharge: Family;Available PRN/intermittently Type of Home: Apartment Home Access: Level entry     Home Layout: One level     Bathroom Shower/Tub: Teacher, early years/pre: Standard     Home Equipment: Conservation officer, nature (2 wheels);Rollator (4 wheels);Shower seat;Grab bars - tub/shower          Prior Functioning/Environment Prior Level of Function : Independent/Modified Independent;History of Falls (last six months)  Mobility Comments: Uses rolling walker or rollator when out. Mostly furniture walker inside ADLs Comments: Reports Mod I for ADLs, basic IADLs. sponge bathes on bad days but typically able to get in shower and use shower chair        OT Problem List: Decreased strength;Decreased activity tolerance;Impaired balance (sitting and/or standing);Decreased knowledge of use of DME or AE;Decreased knowledge of precautions;Pain      OT Treatment/Interventions: Self-care/ADL training;Therapeutic exercise;DME and/or  AE instruction;Therapeutic activities;Patient/family education;Balance training    OT Goals(Current goals can be found in the care plan section) Acute Rehab OT Goals Patient Stated Goal: get stronger and go to rehab OT Goal Formulation: With patient Time For Goal Achievement: 06/17/22 Potential to Achieve Goals: Good  OT Frequency: Min 2X/week    Co-evaluation              AM-PAC OT "6 Clicks" Daily Activity     Outcome Measure Help from another person eating meals?: None Help from another person taking care of personal grooming?: A Little Help from another person toileting, which includes using toliet, bedpan, or urinal?: A Lot Help from another person bathing (including washing, rinsing, drying)?: A Lot Help from another person to put on and taking off regular upper body clothing?: A Little Help from another person to put on and taking off regular lower body clothing?: A Lot 6 Click Score: 16   End of Session Equipment Utilized During Treatment: Gait belt;Rolling walker (2 wheels) Nurse Communication: Mobility status  Activity Tolerance: Patient tolerated treatment well Patient left: in bed;with call bell/phone within reach;with bed alarm set  OT Visit Diagnosis: Unsteadiness on feet (R26.81);Muscle weakness (generalized) (M62.81);History of falling (Z91.81);Pain Pain - Right/Left: Left Pain - part of body: Ankle and joints of foot                Time: 1006-1051 OT Time Calculation (min): 45 min Charges:  OT General Charges $OT Visit: 1 Visit OT Evaluation $OT Eval Moderate Complexity: 1 Mod  Elder Cyphers, OTR/L [facility] Acute Rehabilitation Office: [phone]    Magnus Ivan 06/03/2022, 1:40 PM

## 2022-06-03 NOTE — Progress Notes (Signed)
Orthopaedics Daily Progress Note   06/03/2022   5:58 PM  Rodney Farley is a 88 y.o. male 1 Day Post-Op s/p OPEN REDUCTION INTERNAL FIXATION (ORIF) LEFT LATERAL MALLEOLUS  Subjective Resting comfortably.  Pain well controlled.   Objective Vitals:   06/03/22 0505 06/03/22 0837  BP: (!) 101/44 (!) 103/38  Pulse: 67 80  Resp: 16 16  Temp: 98.3 F (36.8 C) 98.9 F (37.2 C)  SpO2: 97% 96%    Intake/Output Summary (Last 24 hours) at 06/03/2022 1758 Last data filed at 06/03/2022 0900 Gross per 24 hour  Intake 220 ml  Output --  Net 220 ml    Physical Exam Resting comfortably NLB LLE: Splint clean, dry, and intact +DP/PT and WWP distally  Assessment 88 y.o. male s/p Procedure(s) (LRB): OPEN REDUCTION INTERNAL FIXATION (ORIF) LEFT LATERAL MALLEOLUS (Left)  Plan Mobility: Out of bed with PT/OT; elevate left lower extremity higher than heart is much as possible Pain control: Continue to wean/titrate to appropriate oral regimen DVT Prophylaxis: 81 mg aspirin twice daily for 2 weeks LLE: Nonweightbearing Dressing care: Keep splint clean, dry, and intact.  No dressing changes are required.  This will be removed in the office.  If the splint feels too tight, you may unwrap the Ace wrap, gently pull on the plaster/cotton, and rewrap the Ace a little bit looser.  It does need to be tight enough to keep the plaster splint on and in the right place, but sometimes postoperative swelling can make this tighter than it was in the operating room. Disposition: Per primary team as medically appropriate Follow-up: Please call [place] [phone]) to schedule follow-op appointment for 2 weeks after surgery.  I have verified that my discharge instructions and follow-up information have been entered in the Discharge Navigator in Epic.  These should automatically populate in the AVS.  Please print the AVS in its entirety and ensure that the patient or a  responsible party has a complete copy of the AVS before they are discharged.  If there are questions regarding discharge instructions or follow-up before the AVS is generated, please check the Discharge Navigator before attempting to contact the surgeon/office.  If unsure how to access the Discharge Navigator or the information contained in the Discharge Navigator, or how to generate/print the AVS, please contact the appropriate Nurse, learning disability.   [name] postoperative prescriptions (e.g. pain medication, anticoagulation) have been printed, signed, and placed in/on the patient's hard chart: Norco    Georgeanna Harrison M.D. Orthopaedic Surgery [facility]

## 2022-06-04 DIAGNOSIS — N186 End stage renal disease: Secondary | ICD-10-CM | POA: Diagnosis not present

## 2022-06-04 DIAGNOSIS — I255 Ischemic cardiomyopathy: Secondary | ICD-10-CM | POA: Diagnosis not present

## 2022-06-04 DIAGNOSIS — Z95810 Presence of automatic (implantable) cardiac defibrillator: Secondary | ICD-10-CM | POA: Diagnosis not present

## 2022-06-04 DIAGNOSIS — I25708 Atherosclerosis of coronary artery bypass graft(s), unspecified, with other forms of angina pectoris: Secondary | ICD-10-CM | POA: Diagnosis not present

## 2022-06-04 LAB — RENAL FUNCTION PANEL
Albumin: 2.7 g/dL — ABNORMAL LOW (ref 3.5–5.0)
Anion gap: 20 — ABNORMAL HIGH (ref 5–15)
BUN: 46 mg/dL — ABNORMAL HIGH (ref 8–23)
CO2: 26 mmol/L (ref 22–32)
Calcium: 9.4 mg/dL (ref 8.9–10.3)
Chloride: 89 mmol/L — ABNORMAL LOW (ref 98–111)
Creatinine, Ser: 7.7 mg/dL — ABNORMAL HIGH (ref 0.61–1.24)
GFR, Estimated: 6 mL/min — ABNORMAL LOW (ref >60)
Glucose, Bld: 120 mg/dL — ABNORMAL HIGH (ref 70–99)
Phosphorus: 5.4 mg/dL — ABNORMAL HIGH (ref 2.5–4.6)
Potassium: 4 mmol/L (ref 3.5–5.1)
Sodium: 135 mmol/L (ref 135–145)

## 2022-06-04 MED ORDER — CHLORHEXIDINE GLUCONATE CLOTH 2 % EX PADS
6.0000 | MEDICATED_PAD | Freq: Every day | CUTANEOUS | Status: DC
  Administered 2022-06-04 – 2022-06-09 (×5): 6 via TOPICAL

## 2022-06-04 MED ORDER — CALCITRIOL 0.5 MCG PO CAPS
1.0000 ug | ORAL_CAPSULE | ORAL | Status: DC
  Administered 2022-06-05: 1 ug via ORAL
  Filled 2022-06-04: qty 2

## 2022-06-04 NOTE — Progress Notes (Signed)
PATIENT ID: Rodney Farley  MRN: [id number]  DOB/AGE:  March 07, 1932 / 88 y.o.  2 Days Post-Op Procedure(s) (LRB): OPEN REDUCTION INTERNAL FIXATION (ORIF) LEFT LATERAL MALLEOLUS (Left)  Subjective: Patient resting comfortably in room.  Objective: Vital signs in last 24 hours: Temp:  [97.4 F (36.3 C)-98.9 F (37.2 C)] 97.4 F (36.3 C) (11/23 0558) Pulse Rate:  [67-80] 67 (11/23 0558) Resp:  [16] 16 (11/23 0558) BP: (103-110)/(38-49) 110/40 (11/23 0558) SpO2:  [95 %-96 %] 95 % (11/23 0558)  Intake/Output from previous day: 11/22 0701 - 11/23 0700 In: 460 [P.O.:460] Out: 0    Recent Labs    06/02/22 1600  HGB 11.9*   Recent Labs    06/02/22 1600  HCT 35.0*   Recent Labs    06/03/22 0344 06/04/22 0250  NA 137 135  K 4.3 4.0  CL 90* 89*  CO2 27 26  BUN 28* 46*  CREATININE 5.32* 7.70*  GLUCOSE 113* 120*  CALCIUM 9.4 9.4     Physical Exam: Patient resting during rounds- did not awaken Compartment soft LLE Splint C/D/I Distal pulses 2+  Assessment/Plan: 2 Days Post-Op Procedure(s) (LRB): OPEN REDUCTION INTERNAL FIXATION (ORIF) LEFT LATERAL MALLEOLUS (Left)   Mobility: Out of bed with PT/OT; elevate left lower extremity higher than heart is much as possible Pain control: Continue to wean/titrate to appropriate oral regimen DVT Prophylaxis: 81 mg aspirin twice daily for 2 weeks LLE: Nonweightbearing Dressing care: Keep splint clean, dry, and intact.  No dressing changes are required.  This will be removed in the office.  If the splint feels too tight, you may unwrap the Ace wrap, gently pull on the plaster/cotton, and rewrap the Ace a little bit looser.  It does need to be tight enough to keep the plaster splint on and in the right place, but sometimes postoperative swelling can make this tighter than it was in the operating room. Disposition: Per primary team as medically appropriate Follow-up: Please call [place]  [phone]) to schedule follow-op appointment for 2 weeks after surgery  Rx for Norco on chart   Telma Pyeatt L. [place], PA-C 06/04/2022, 8:13 AM

## 2022-06-04 NOTE — Progress Notes (Signed)
PROGRESS NOTE    Rodney Farley  OYD:[id number] DOB: 1931/11/25 DOA: 05/28/2022 PCP: [name], MD    Brief Narrative:  88 year old from home with extensive cardiac history including coronary artery disease, non-STEMI and multiple PCI's with recent PCI on 05/07/2022, recurrent in-stent restenosis of LAD, ischemic cardiomyopathy, chronic combined heart failure, complete heart block status post ICD CRT, ESRD on hemodialysis and obstructive sleep apnea presented to the emergency room on 11/17 following a mechanical fall at home the night prior and severe pain in his left ankle.  Patient was found to have left ankle fracture and admitted to the hospital.  After medically optimizing, patient underwent ORIF left ankle 11/21. Medically stabilizing.  Waiting for SNF bed.   Assessment & Plan:   Closed traumatic fracture and dislocation of the left ankle: ORIF 11/20 Dr. Mable Fill Nonweightbearing left lower extremity DVT prophylaxis with SCDs, aspirin 81 mg twice daily for 2 weeks. Adequate IV and oral opiates for pain relief, mobilize with PT OT.   Ortho to schedule postop follow-up. Stable to transfer to SNF.  ESRD on hemodialysis Chronic hypotension on midodrine with dialysis Chronic combined heart failure and ischemic cardiomyopathy status post AICD Coronary artery disease and multiple PCI's with recent PCI:  Chronic and stable.  Receiving dialysis on his schedule and followed by nephrology. Blood pressures are stable. Euvolemic and fluid balance maintained with hemodialysis. Recent LAD in-stent restenosis status post PCI on aspirin, Brilinta and a statin.  Patient is on atorvastatin and Zetia.  Repatha as outpatient.  Untreated sleep apnea.  Untreated.  He does not use CPAP.  Medically stabilizing.  Transfer to SNF when bed available.    DVT prophylaxis: SCDs Start: 06/02/22 1842 heparin injection 5,000 Units Start: 05/29/22 0915   Code Status: Full code. Family  Communication: None today.  Son unable to pick up the phone. Disposition Plan: Status is: Inpatient Remains inpatient appropriate because: Pain control, rehab availability     Consultants:  Nephrology Orthopedics Cardiology  Procedures:  ORIF left ankle 11/21  Antimicrobials:  Perioperative   Subjective:  Patient seen and examined.  More quiet and composed today.  More interactive.  He was very anxious about moving his knee and hip.  I helped him to mobilize and elevate his leg.  Pain is controlled.  He was very happy to be able to know that his ankle is stable and his other joints can be moved.  Waiting for SNF.  Prefers to go to [place].  Objective: Vitals:   06/03/22 0837 06/03/22 2055 06/04/22 0558 06/04/22 1101  BP: (!) 103/38 (!) 108/49 (!) 110/40 (!) 105/55  Pulse: 80 80 67 80  Resp: 16 16 16 20   Temp: 98.9 F (37.2 C) 98.7 F (37.1 C) (!) 97.4 F (36.3 C) 98.4 F (36.9 C)  TempSrc: Oral Oral Oral Oral  SpO2: 96% 96% 95% 95%  Weight:      Height:        Intake/Output Summary (Last 24 hours) at 06/04/2022 1141 Last data filed at 06/04/2022 0645 Gross per 24 hour  Intake 240 ml  Output 0 ml  Net 240 ml    Filed Weights   06/01/22 2003 06/02/22 0801 06/02/22 1221  Weight: 93.3 kg 92 kg 90.1 kg    Examination:  General exam: Appears calm , interactive.  Not in any distress. Respiratory system: Clear to auscultation. Respiratory effort normal.  No added sounds. Cardiovascular system: S1 & S2 heard, RRR.  Gastrointestinal system: Abdomen is nondistended, soft  and nontender. No organomegaly or masses felt. Normal bowel sounds heard. Central nervous system: Alert and oriented. No focal neurological deficits. Extremities: Symmetric 5 x 5 power.  AV fistula right arm. Left foot on a short leg cast.  Toes are pink and with adequate supplies.   Data Reviewed: I have personally reviewed following labs and imaging studies  CBC: Recent Labs  Lab  05/28/22 2357 05/30/22 0219 06/01/22 0248 06/02/22 1600  WBC 5.0 6.1 9.4  --   NEUTROABS 2.8  --   --   --   HGB 11.7* 9.3* 10.3* 11.9*  HCT 35.7* 27.3* 31.3* 35.0*  MCV 106.3* 103.8* 103.6*  --   PLT 120* 120* 174  --     Basic Metabolic Panel: Recent Labs  Lab 05/31/22 0250 06/01/22 0248 06/02/22 0425 06/02/22 1600 06/03/22 0344 06/04/22 0250  NA 135 137 136 134* 137 135  K 4.8 4.3 4.6 4.2 4.3 4.0  CL 92* 96* 90* 93* 90* 89*  CO2 30 29 27   --  27 26  GLUCOSE 156* 101* 104* 114* 113* 120*  BUN 22 18 39* 19 28* 46*  CREATININE 6.28* 5.00* 7.37* 4.30* 5.32* 7.70*  CALCIUM 8.7* 9.3 9.4  --  9.4 9.4  PHOS 4.0 4.4 5.1*  --  4.9* 5.4*    GFR: Estimated Creatinine Clearance: 7.1 mL/min (A) (by C-G formula based on SCr of 7.7 mg/dL (H)). Liver Function Tests: Recent Labs  Lab 05/31/22 0250 06/01/22 0248 06/02/22 0425 06/03/22 0344 06/04/22 0250  ALBUMIN 3.0* 3.1* 2.9* 2.8* 2.7*    No results for input(s): "LIPASE", "AMYLASE" in the last 168 hours. No results for input(s): "AMMONIA" in the last 168 hours. Coagulation Profile: No results for input(s): "INR", "PROTIME" in the last 168 hours. Cardiac Enzymes: No results for input(s): "CKTOTAL", "CKMB", "CKMBINDEX", "TROPONINI" in the last 168 hours. BNP (last 3 results) No results for input(s): "PROBNP" in the last 8760 hours. HbA1C: No results for input(s): "HGBA1C" in the last 72 hours. CBG: Recent Labs  Lab 06/02/22 1802  GLUCAP 115*    Lipid Profile: No results for input(s): "CHOL", "HDL", "LDLCALC", "TRIG", "CHOLHDL", "LDLDIRECT" in the last 72 hours. Thyroid Function Tests: No results for input(s): "TSH", "T4TOTAL", "FREET4", "T3FREE", "THYROIDAB" in the last 72 hours. Anemia Panel: No results for input(s): "VITAMINB12", "FOLATE", "FERRITIN", "TIBC", "IRON", "RETICCTPCT" in the last 72 hours. Sepsis Labs: No results for input(s): "PROCALCITON", "LATICACIDVEN" in the last 168 hours.  No results found  for this or any previous visit (from the past 240 hour(s)).       Radiology Studies: DG Ankle Complete Left  Result Date: 06/02/2022 CLINICAL DATA:  [id number] Postop check [id number] EXAM: LEFT ANKLE COMPLETE - 3+ VIEW COMPARISON:  Intraoperative radiographs left foot 06/02/2022, x-ray left ankle 05/29/2022 FINDINGS: Cast overlies foot. Intramedullary nail fixation of the mid to distal fibula. Metallic density along the medial malleolus likely related to ligament repair. There is no evidence of new fracture, dislocation, or joint effusion. There is no evidence of arthropathy or other focal bone abnormality. Posterior calcaneal spur. Soft tissues are unremarkable. IMPRESSION: Intramedullary nail fixation of the mid to distal fibula. Metallic density along the medial malleolus likely related to ligament repair. Electronically Signed   By: Iven Finn M.D.   On: 06/02/2022 20:04   DG Ankle 2 Views Left  Result Date: 06/02/2022 CLINICAL DATA:  ORIF left lateral malleolar fracture EXAM: LEFT ANKLE - 2 VIEW COMPARISON:  Left ankle x-ray 05/29/2022 FINDINGS: Intraoperative left  ankle. Three low resolution intraoperative spot views of the left ankle were obtained. There is a new distal fibular intramedullary nail and screws. There is also new fixation devices crossing the distal tibia and fibula lateral ray. Alignment is anatomic. There is soft tissue swelling surrounding the ankle. Medial malleolar tip fracture again noted. Total fluoroscopy time: 49 seconds Total radiation dose: 1.63 micro Gy IMPRESSION: 1. Intraoperative fluoroscopy for ORIF of distal fibular fracture. Alignment is anatomic. 2. Medial malleolar tip fracture again noted. Electronically Signed   By: Ronney Asters M.D.   On: 06/02/2022 17:49   DG C-Arm 1-60 Min-No Report  Result Date: 06/02/2022 Fluoroscopy was utilized by the requesting physician.  No radiographic interpretation.        Scheduled Meds:  (feeding supplement)  PROSource Plus  30 mL Oral BID BM   acidophilus  1 capsule Oral Daily   aspirin EC  81 mg Oral BID   atorvastatin  80 mg Oral QHS   [START ON 06/05/2022] calcitRIOL  1 mcg Oral Q M,W,F-HD   [START ON 06/06/2022] carvedilol  3.125 mg Oral 2 times per day on Sun Tue Thu Sat   [START ON 06/05/2022] carvedilol  3.125 mg Oral Once per day on Mon Wed Fri   carvedilol  3.125 mg Oral 2 times per day on Mon Wed Thu   Chlorhexidine Gluconate Cloth  6 each Topical Q0600   Chlorhexidine Gluconate Cloth  6 each Topical Q0600   cyanocobalamin  1,000 mcg Oral Daily   darbepoetin (ARANESP) injection - DIALYSIS  100 mcg Subcutaneous Q Tue-1800   docusate sodium  100 mg Oral BID   ezetimibe  10 mg Oral Daily   gabapentin  100 mg Oral BID   Followed by   Derrill Memo ON 06/05/2022] gabapentin  100 mg Oral QHS   heparin  5,000 Units Subcutaneous Q8H   isosorbide mononitrate  15 mg Oral Daily   melatonin  3 mg Oral QHS   midodrine  10 mg Oral Q M,W,F   midodrine  10 mg Oral Once in dialysis   multivitamin  1 tablet Oral Daily   pantoprazole  40 mg Oral Daily   senna  1 tablet Oral QHS   sevelamer carbonate  2,400 mg Oral BID WC   ticagrelor  90 mg Oral BID   Continuous Infusions:     LOS: 6 days    Time spent: 35 minutes    Barb Merino, MD [facility] Pager [phone]

## 2022-06-04 NOTE — Progress Notes (Signed)
[facility] Progress Note   Subjective:   Sleepy this AM. Expresses frustration that he is not able to do much yet. Denies SOB, CP, dizziness and nausea. Slightly confused, thought today was Saturday.   Objective Vitals:   06/03/22 0837 06/03/22 2055 06/04/22 0558 06/04/22 1101  BP: (!) 103/38 (!) 108/49 (!) 110/40 (!) 105/55  Pulse: 80 80 67 80  Resp: 16 16 16 20   Temp: 98.9 F (37.2 C) 98.7 F (37.1 C) (!) 97.4 F (36.3 C) 98.4 F (36.9 C)  TempSrc: Oral Oral Oral Oral  SpO2: 96% 96% 95% 95%  Weight:      Height:       Physical Exam General: Elderly man, NAD. Room air. Heart: RRR; 2/6 murmur Lungs: CTA anteriorly Abdomen: soft, non-tender Extremities: L ankle in large ACE wrap, no RLE edema Dialysis Access: RUE AVG + thrill/bruit,   Additional Objective Labs: Basic Metabolic Panel: Recent Labs  Lab 06/02/22 0425 06/02/22 1600 06/03/22 0344 06/04/22 0250  NA 136 134* 137 135  K 4.6 4.2 4.3 4.0  CL 90* 93* 90* 89*  CO2 27  --  27 26  GLUCOSE 104* 114* 113* 120*  BUN 39* 19 28* 46*  CREATININE 7.37* 4.30* 5.32* 7.70*  CALCIUM 9.4  --  9.4 9.4  PHOS 5.1*  --  4.9* 5.4*   Liver Function Tests: Recent Labs  Lab 06/02/22 0425 06/03/22 0344 06/04/22 0250  ALBUMIN 2.9* 2.8* 2.7*   No results for input(s): "LIPASE", "AMYLASE" in the last 168 hours. CBC: Recent Labs  Lab 05/28/22 2357 05/30/22 0219 06/01/22 0248 06/02/22 1600  WBC 5.0 6.1 9.4  --   NEUTROABS 2.8  --   --   --   HGB 11.7* 9.3* 10.3* 11.9*  HCT 35.7* 27.3* 31.3* 35.0*  MCV 106.3* 103.8* 103.6*  --   PLT 120* 120* 174  --    Blood Culture    Component Value Date/Time   SDES URINE, CLEAN CATCH 04/07/2019 1931   SPECREQUEST  04/07/2019 1931    NONE Performed at [facility], [place] [address], [place], [place] [address]    CULT >=100,000 COLONIES/mL PSEUDOMONAS AERUGINOSA (A) 04/07/2019 1931   REPTSTATUS 04/09/2019 FINAL 04/07/2019 1931    Cardiac Enzymes: No  results for input(s): "CKTOTAL", "CKMB", "CKMBINDEX", "TROPONINI" in the last 168 hours. CBG: Recent Labs  Lab 06/02/22 1802  GLUCAP 115*   Iron Studies: No results for input(s): "IRON", "TIBC", "TRANSFERRIN", "FERRITIN" in the last 72 hours. @lablastinr3 @ Studies/Results: DG Ankle Complete Left  Result Date: 06/02/2022 CLINICAL DATA:  [id number] Postop check [id number] EXAM: LEFT ANKLE COMPLETE - 3+ VIEW COMPARISON:  Intraoperative radiographs left foot 06/02/2022, x-ray left ankle 05/29/2022 FINDINGS: Cast overlies foot. Intramedullary nail fixation of the mid to distal fibula. Metallic density along the medial malleolus likely related to ligament repair. There is no evidence of new fracture, dislocation, or joint effusion. There is no evidence of arthropathy or other focal bone abnormality. Posterior calcaneal spur. Soft tissues are unremarkable. IMPRESSION: Intramedullary nail fixation of the mid to distal fibula. Metallic density along the medial malleolus likely related to ligament repair. Electronically Signed   By: Iven Finn M.D.   On: 06/02/2022 20:04   DG Ankle 2 Views Left  Result Date: 06/02/2022 CLINICAL DATA:  ORIF left lateral malleolar fracture EXAM: LEFT ANKLE - 2 VIEW COMPARISON:  Left ankle x-ray 05/29/2022 FINDINGS: Intraoperative left ankle. Three low resolution intraoperative spot views of the left ankle were obtained.  There is a new distal fibular intramedullary nail and screws. There is also new fixation devices crossing the distal tibia and fibula lateral ray. Alignment is anatomic. There is soft tissue swelling surrounding the ankle. Medial malleolar tip fracture again noted. Total fluoroscopy time: 49 seconds Total radiation dose: 1.63 micro Gy IMPRESSION: 1. Intraoperative fluoroscopy for ORIF of distal fibular fracture. Alignment is anatomic. 2. Medial malleolar tip fracture again noted. Electronically Signed   By: Ronney Asters M.D.   On: 06/02/2022 17:49   DG C-Arm  1-60 Min-No Report  Result Date: 06/02/2022 Fluoroscopy was utilized by the requesting physician.  No radiographic interpretation.   Medications:   (feeding supplement) PROSource Plus  30 mL Oral BID BM   acidophilus  1 capsule Oral Daily   aspirin EC  81 mg Oral BID   atorvastatin  80 mg Oral QHS   calcitRIOL  1.75 mcg Oral Q M,W,F-HD   [START ON 06/06/2022] carvedilol  3.125 mg Oral 2 times per day on Sun Tue Thu Sat   [START ON 06/05/2022] carvedilol  3.125 mg Oral Once per day on Mon Wed Fri   carvedilol  3.125 mg Oral 2 times per day on Mon Wed Thu   Chlorhexidine Gluconate Cloth  6 each Topical Q0600   cyanocobalamin  1,000 mcg Oral Daily   darbepoetin (ARANESP) injection - DIALYSIS  100 mcg Subcutaneous Q Tue-1800   docusate sodium  100 mg Oral BID   ezetimibe  10 mg Oral Daily   gabapentin  100 mg Oral BID   Followed by   Derrill Memo ON 06/05/2022] gabapentin  100 mg Oral QHS   heparin  5,000 Units Subcutaneous Q8H   isosorbide mononitrate  15 mg Oral Daily   melatonin  3 mg Oral QHS   midodrine  10 mg Oral Q M,W,F   midodrine  10 mg Oral Once in dialysis   multivitamin  1 tablet Oral Daily   pantoprazole  40 mg Oral Daily   senna  1 tablet Oral QHS   sevelamer carbonate  2,400 mg Oral BID WC   ticagrelor  90 mg Oral BID    Outpatient Dialysis Orders: MWF NW  4h   400/500  94kg  2/2 bath  Hep 2500+ 1025midrun  RUA AVG - last HD 11/15, post 95.4kg - calcitriol 1.75 ug po tiw - mircera 69mcg q4, last 11/6  Assessment/Plan: L ankle fracture: S/p ORIF 11/21 - pain control per primary. ESRD: Usual MWF schedule - following Thanksgiving Holiday schedule this week: Sun/Tues/Fri. Next HD Friday 11/24. Hypertension/volume: On midodrine pre-HD (as well as BB due to CAD). He does not appear overloaded at this time.   HFrEF - EF 45-50% CAD/multiple PCI/stent: recurrent unstable angina, followed by cardiology.  Anemia of ESRD: Hgb 11.9 - Aranesp 123mcg started q Tuesday - await  next Hgb to see if had post-op drop. May need to hold the ESA if Hgb remains > 11. Secondary HPTH: Phos controlled. Corrected calcium elevated, decreased VDRA DM2 - on insulin, per pmd Nutrition - Renal diet w/fluid restrictions, continue supplements.    Anice Paganini, PA-C 06/04/2022, 11:09 AM  [facility] Pager: [phone]

## 2022-06-05 ENCOUNTER — Other Ambulatory Visit (HOSPITAL_COMMUNITY): Payer: Self-pay

## 2022-06-05 DIAGNOSIS — N186 End stage renal disease: Secondary | ICD-10-CM | POA: Diagnosis not present

## 2022-06-05 DIAGNOSIS — I255 Ischemic cardiomyopathy: Secondary | ICD-10-CM | POA: Diagnosis not present

## 2022-06-05 DIAGNOSIS — Z95810 Presence of automatic (implantable) cardiac defibrillator: Secondary | ICD-10-CM | POA: Diagnosis not present

## 2022-06-05 DIAGNOSIS — S82892A Other fracture of left lower leg, initial encounter for closed fracture: Secondary | ICD-10-CM | POA: Diagnosis not present

## 2022-06-05 LAB — RENAL FUNCTION PANEL
Albumin: 2.6 g/dL — ABNORMAL LOW (ref 3.5–5.0)
Anion gap: 22 — ABNORMAL HIGH (ref 5–15)
BUN: 68 mg/dL — ABNORMAL HIGH (ref 8–23)
CO2: 26 mmol/L (ref 22–32)
Calcium: 9.7 mg/dL (ref 8.9–10.3)
Chloride: 92 mmol/L — ABNORMAL LOW (ref 98–111)
Creatinine, Ser: 9.63 mg/dL — ABNORMAL HIGH (ref 0.61–1.24)
GFR, Estimated: 5 mL/min — ABNORMAL LOW (ref >60)
Glucose, Bld: 127 mg/dL — ABNORMAL HIGH (ref 70–99)
Phosphorus: 6.1 mg/dL — ABNORMAL HIGH (ref 2.5–4.6)
Potassium: 4.6 mmol/L (ref 3.5–5.1)
Sodium: 140 mmol/L (ref 135–145)

## 2022-06-05 LAB — CBC
HCT: 28.7 % — ABNORMAL LOW (ref 39.0–52.0)
Hemoglobin: 9.3 g/dL — ABNORMAL LOW (ref 13.0–17.0)
MCH: 33 pg (ref 26.0–34.0)
MCHC: 32.4 g/dL (ref 30.0–36.0)
MCV: 101.8 fL — ABNORMAL HIGH (ref 80.0–100.0)
Platelets: 312 K/uL (ref 150–400)
RBC: 2.82 MIL/uL — ABNORMAL LOW (ref 4.22–5.81)
RDW: 14.3 % (ref 11.5–15.5)
WBC: 8.1 K/uL (ref 4.0–10.5)
nRBC: 0 % (ref 0.0–0.2)

## 2022-06-05 MED ORDER — ALTEPLASE 2 MG IJ SOLR: 2.0000 mg | Freq: Once | INTRAMUSCULAR | Status: DC | PRN

## 2022-06-05 MED ORDER — PENTAFLUOROPROP-TETRAFLUOROETH EX AERO: 1.0000 | INHALATION_SPRAY | CUTANEOUS | Status: DC | PRN

## 2022-06-05 MED ORDER — LIDOCAINE HCL (PF) 1 % IJ SOLN: 5.0000 mL | INTRAMUSCULAR | Status: DC | PRN

## 2022-06-05 MED ORDER — LIDOCAINE-PRILOCAINE 2.5-2.5 % EX CREA: 1.0000 | TOPICAL_CREAM | CUTANEOUS | Status: DC | PRN

## 2022-06-05 MED ORDER — ANTICOAGULANT SODIUM CITRATE 4% (200MG/5ML) IV SOLN: 5.0000 mL | Status: DC | PRN

## 2022-06-05 NOTE — Progress Notes (Signed)
PROGRESS NOTE    Rodney Farley  BWG:[id number] DOB: 1931-11-23 DOA: 05/28/2022 PCP: [name], MD    Brief Narrative:  88 year old from home with extensive cardiac history including coronary artery disease, non-STEMI and multiple PCI's with recent PCI on 05/07/2022, recurrent in-stent restenosis of LAD, ischemic cardiomyopathy, chronic combined heart failure, complete heart block status post ICD CRT, ESRD on hemodialysis and obstructive sleep apnea presented to the emergency room on 11/17 following a mechanical fall at home the night prior and severe pain in his left ankle.  Patient was found to have left ankle fracture and admitted to the hospital.  After medically optimizing, patient underwent ORIF left ankle 11/21. Medically stabilizing.  Waiting for SNF bed.   Assessment & Plan:   Closed traumatic fracture and dislocation of the left ankle: ORIF 11/20 Dr. Mable Fill Nonweightbearing left lower extremity DVT prophylaxis with SCDs, aspirin 81 mg twice daily for 2 weeks. Adequate IV and oral opiates for pain relief, mobilize with PT OT.   Ortho to schedule postop follow-up. Stable to transfer to SNF.  ESRD on hemodialysis Chronic hypotension on midodrine with dialysis Chronic combined heart failure and ischemic cardiomyopathy status post AICD Coronary artery disease and multiple PCI's with recent PCI:  Chronic and stable.  Receiving dialysis on his schedule and followed by nephrology. Blood pressures are stable. Euvolemic and fluid balance maintained with hemodialysis. Recent LAD in-stent restenosis status post PCI on aspirin, Brilinta and a statin.  Patient is on atorvastatin and Zetia.  Repatha as outpatient.  Untreated sleep apnea.  Untreated.  He does not use CPAP.  Medically stabilizing.  Transfer to SNF when bed available.    DVT prophylaxis: SCDs Start: 06/02/22 1842 heparin injection 5,000 Units Start: 05/29/22 0915   Code Status: Full code. Family  Communication: None. Disposition Plan: Status is: Inpatient Remains inpatient appropriate because: Waiting for skilled bed.     Consultants:  Nephrology Orthopedics Cardiology  Procedures:  ORIF left ankle 11/21  Antimicrobials:  Perioperative   Subjective:  Seen and examined.  He was receiving hemodialysis.  He was sleepy and snoring.  He told me he is fine.  Objective: Vitals:   06/05/22 1100 06/05/22 1130 06/05/22 1156 06/05/22 1229  BP: (!) 81/46 (!) 76/48 (!) 103/44 (!) 130/59  Pulse: 80 80 80 80  Resp: 20 (!) 21 13   Temp:   98.3 F (36.8 C) 98.2 F (36.8 C)  TempSrc:   Oral Oral  SpO2: 95% 97% 99% 94%  Weight:      Height:        Intake/Output Summary (Last 24 hours) at 06/05/2022 1309 Last data filed at 06/04/2022 1847 Gross per 24 hour  Intake 240 ml  Output --  Net 240 ml    Filed Weights   06/02/22 0801 06/02/22 1221 06/05/22 0841  Weight: 92 kg 90.1 kg 89.7 kg    Examination:  He appears comfortable.  Receiving hemodialysis.   Data Reviewed: I have personally reviewed following labs and imaging studies  CBC: Recent Labs  Lab 05/30/22 0219 06/01/22 0248 06/02/22 1600 06/05/22 0318  WBC 6.1 9.4  --  8.1  HGB 9.3* 10.3* 11.9* 9.3*  HCT 27.3* 31.3* 35.0* 28.7*  MCV 103.8* 103.6*  --  101.8*  PLT 120* 174  --  177    Basic Metabolic Panel: Recent Labs  Lab 06/01/22 0248 06/02/22 0425 06/02/22 1600 06/03/22 0344 06/04/22 0250 06/05/22 0318  NA 137 136 134* 137 135 140  K 4.3  4.6 4.2 4.3 4.0 4.6  CL 96* 90* 93* 90* 89* 92*  CO2 29 27  --  27 26 26   GLUCOSE 101* 104* 114* 113* 120* 127*  BUN 18 39* 19 28* 46* 68*  CREATININE 5.00* 7.37* 4.30* 5.32* 7.70* 9.63*  CALCIUM 9.3 9.4  --  9.4 9.4 9.7  PHOS 4.4 5.1*  --  4.9* 5.4* 6.1*    GFR: Estimated Creatinine Clearance: 5.7 mL/min (A) (by C-G formula based on SCr of 9.63 mg/dL (H)). Liver Function Tests: Recent Labs  Lab 06/01/22 0248 06/02/22 0425 06/03/22 0344  06/04/22 0250 06/05/22 0318  ALBUMIN 3.1* 2.9* 2.8* 2.7* 2.6*    No results for input(s): "LIPASE", "AMYLASE" in the last 168 hours. No results for input(s): "AMMONIA" in the last 168 hours. Coagulation Profile: No results for input(s): "INR", "PROTIME" in the last 168 hours. Cardiac Enzymes: No results for input(s): "CKTOTAL", "CKMB", "CKMBINDEX", "TROPONINI" in the last 168 hours. BNP (last 3 results) No results for input(s): "PROBNP" in the last 8760 hours. HbA1C: No results for input(s): "HGBA1C" in the last 72 hours. CBG: Recent Labs  Lab 06/02/22 1802  GLUCAP 115*    Lipid Profile: No results for input(s): "CHOL", "HDL", "LDLCALC", "TRIG", "CHOLHDL", "LDLDIRECT" in the last 72 hours. Thyroid Function Tests: No results for input(s): "TSH", "T4TOTAL", "FREET4", "T3FREE", "THYROIDAB" in the last 72 hours. Anemia Panel: No results for input(s): "VITAMINB12", "FOLATE", "FERRITIN", "TIBC", "IRON", "RETICCTPCT" in the last 72 hours. Sepsis Labs: No results for input(s): "PROCALCITON", "LATICACIDVEN" in the last 168 hours.  No results found for this or any previous visit (from the past 240 hour(s)).       Radiology Studies: No results found.      Scheduled Meds:  (feeding supplement) PROSource Plus  30 mL Oral BID BM   acidophilus  1 capsule Oral Daily   aspirin EC  81 mg Oral BID   atorvastatin  80 mg Oral QHS   calcitRIOL  1 mcg Oral Q M,W,F-HD   [START ON 06/06/2022] carvedilol  3.125 mg Oral 2 times per day on Sun Tue Thu Sat   carvedilol  3.125 mg Oral Once per day on Mon Wed Fri   Chlorhexidine Gluconate Cloth  6 each Topical Q0600   Chlorhexidine Gluconate Cloth  6 each Topical Q0600   cyanocobalamin  1,000 mcg Oral Daily   darbepoetin (ARANESP) injection - DIALYSIS  100 mcg Subcutaneous Q Tue-1800   docusate sodium  100 mg Oral BID   ezetimibe  10 mg Oral Daily   gabapentin  100 mg Oral QHS   heparin  5,000 Units Subcutaneous Q8H   isosorbide  mononitrate  15 mg Oral Daily   melatonin  3 mg Oral QHS   midodrine  10 mg Oral Q M,W,F   midodrine  10 mg Oral Once in dialysis   multivitamin  1 tablet Oral Daily   pantoprazole  40 mg Oral Daily   senna  1 tablet Oral QHS   sevelamer carbonate  2,400 mg Oral BID WC   ticagrelor  90 mg Oral BID   Continuous Infusions:     LOS: 7 days    Time spent: 25 minutes    Barb Merino, MD [facility] Pager [phone]

## 2022-06-05 NOTE — Progress Notes (Signed)
PT Cancellation Note  Patient Details Name: Rodney Farley MRN: [id number] DOB: 1931/09/04   Cancelled Treatment:    Reason Eval/Treat Not Completed: Patient at procedure or test/unavailable  Going to HD at this time;   Will continue to follow,   [name], [place] [facility] [place] 06/05/2022, 8:49 AM

## 2022-06-05 NOTE — Progress Notes (Signed)
Received patient in bed to unit.  Alert and oriented.  Informed consent signed and in chart.   Treatment initiated: 8850 Treatment completed: 1156  Patient tolerated well.  Transported back to the room  Alert, without acute distress.  Hand-off given to patient's nurse.   Access used: AVG Access issues: none ml Medication(s) given: Midodrine  Post HD VS: 103/44,80,18,98.3, Post HD weight: 89.0   [facility]

## 2022-06-05 NOTE — Plan of Care (Signed)
  Problem: Education: Goal: Understanding of CV disease, CV risk reduction, and recovery process will improve Outcome: Progressing   

## 2022-06-05 NOTE — Progress Notes (Signed)
PATIENT ID: Rodney Farley  MRN: [id number]  DOB/AGE:  86/06/1932 / 88 y.o.  3 Days Post-Op Procedure(s) (LRB): OPEN REDUCTION INTERNAL FIXATION (ORIF) LEFT LATERAL MALLEOLUS (Left)  Subjective: Patient sleeping comfortably in room. LLE elevated on pillow  Objective: Vital signs in last 24 hours: Temp:  [98.3 F (36.8 C)-98.8 F (37.1 C)] 98.3 F (36.8 C) (11/24 0534) Pulse Rate:  [71-80] 71 (11/24 0534) Resp:  [15-21] 21 (11/24 0700) BP: (88-118)/(50-63) 118/54 (11/24 0534) SpO2:  [89 %-95 %] 93 % (11/24 0534)  Intake/Output from previous day: 11/23 0701 - 11/24 0700 In: 720 [P.O.:720] Out: -    Recent Labs    06/02/22 1600  HGB 11.9*   Recent Labs    06/02/22 1600  HCT 35.0*   Recent Labs    06/04/22 0250 06/05/22 0318  NA 135 140  K 4.0 4.6  CL 89* 92*  CO2 26 26  BUN 46* 68*  CREATININE 7.70* 9.63*  GLUCOSE 120* 127*  CALCIUM 9.4 9.7    Physical Exam: Patient resting during rounds- did not awaken Compartment soft LLE Splint C/D/I Distal pulses 2+ Patient moves toes upon touch  Assessment/Plan: 3 Days Post-Op Procedure(s) (LRB): OPEN REDUCTION INTERNAL FIXATION (ORIF) LEFT LATERAL MALLEOLUS (Left)   Mobility: Out of bed with PT/OT; elevate left lower extremity higher than heart is much as possible Pain control: Continue to wean/titrate to appropriate oral regimen DVT Prophylaxis: 81 mg aspirin twice daily for 2 weeks LLE: Nonweightbearing Dressing care: Keep splint clean, dry, and intact.  No dressing changes are required.  This will be removed in the office.  If the splint feels too tight, you may unwrap the Ace wrap, gently pull on the plaster/cotton, and rewrap the Ace a little bit looser.  It does need to be tight enough to keep the plaster splint on and in the right place, but sometimes postoperative swelling can make this tighter than it was in the operating room. Disposition: Per primary team as medically appropriate Follow-up: Please  call [facility] [phone]) to schedule follow-op appointment for 2 weeks after surgery   Rx for Norco on chart  Nayvie Lips L. [place], PA-C 06/05/2022, 7:33 AM

## 2022-06-05 NOTE — TOC Progression Note (Signed)
Transition of Care [facility]) - Initial/Assessment Note    Patient Details  Name: Rodney Farley MRN: [id number] Date of Birth: October 22, 1931  Transition of [facility]) CM/SW Contact:    Milinda Antis, LCSWA Phone Number: 06/05/2022, 12:34 PM  Clinical Narrative:                 LCSW contacted [facility] to inquire about whether they can accept the patient and is awaiting a response.    TOC will continue to follow.      Barriers to Discharge: Continued Medical Work up   Patient Goals and CMS Choice Patient states their goals for this hospitalization and ongoing recovery are:: To return home CMS Medicare.gov Compare Post Acute Care list provided to:: Patient    Expected Discharge Plan and Services     Discharge Planning Services: CM Consult Post Acute Care Choice: Resumption of Svcs/PTA Provider ([place]) Living arrangements for the past 2 months: [place]: PT, OT HH Agency: [place] Date [place]: 06/01/22 Time [place]: 1520 Representative spoke with at [place]: Claiborne [place]  Prior Living Arrangements/Services Living arrangements for the past 2 months: [place] with:: Self Patient language and need for interpreter reviewed:: Yes Do you feel safe going back to the place where you live?: Yes      Need for Family Participation in Patient Care: Yes (Comment) Care giver support system in place?: Yes (comment) Current home services: DME (All necessary DME's) Criminal Activity/Legal Involvement Pertinent to Current Situation/Hospitalization: No - Comment as needed  Activities of Daily Living Home Assistive Devices/Equipment: Eyeglasses, Hearing aid ADL Screening (condition at time of admission) Patient's cognitive ability adequate to safely complete daily activities?: Yes Is the patient deaf or have difficulty hearing?: Yes Does the patient have difficulty seeing,  even when wearing glasses/contacts?: Yes Does the patient have difficulty concentrating, remembering, or making decisions?: No Patient able to express need for assistance with ADLs?: Yes Does the patient have difficulty dressing or bathing?: Yes Independently performs ADLs?: Yes (appropriate for developmental age) Does the patient have difficulty walking or climbing stairs?: Yes Weakness of Legs: Both Weakness of Arms/Hands: None  Permission Sought/Granted Permission sought to share information with : Case Manager, Customer service manager, Family Supports Permission granted to share information with : Yes, Verbal Permission Granted              Emotional Assessment Appearance:: Appears stated age Attitude/Demeanor/Rapport: Engaged, Gracious Affect (typically observed): Accepting, Calm, Hopeful, Pleasant Orientation: : Oriented to Self, Oriented to Place, Oriented to  Time, Oriented to Situation Alcohol / Substance Use: Not Applicable Psych Involvement: No (comment)  Admission diagnosis:  ESRD on dialysis ([place]) [N18.6, Z99.2] Closed left ankle fracture [S82.892A] Closed fracture of left ankle, initial encounter [S82.892A] Accidental fall, initial encounter [W19.XXXA] Patient Active Problem List   Diagnosis Date Noted   Closed left ankle fracture 05/29/2022   Accidental fall 05/29/2022   S/P coronary artery stent placement 05/29/2022   Tremor 01/06/9484   Chronic systolic CHF (congestive heart failure) ([place]) 01/12/2022   Aortic stenosis 12/24/2021   Hypotension 12/24/2021   History of TIA (transient ischemic attack)    Atrial fibrillation ([place]) - SCAF 08/26/2021   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 07/29/2020   Thrombocytopenia ([place]) 04/13/2019   Hypokalemia 04/12/2019  Anaphylactic shock, unspecified, initial encounter 04/10/2019   Anemia in chronic kidney disease 04/10/2019   Iron deficiency anemia, unspecified 04/10/2019   Secondary  hyperparathyroidism of renal origin ([place]) 04/10/2019   Hyperlipidemia 04/13/2018   S/P cholecystectomy 10/13/2017   Diarrhea 05/11/2017   Marital stress 02/18/2017   BPH associated with nocturia 04/27/2016   Diabetic peripheral neuropathy (HCC)    COPD (chronic obstructive pulmonary disease) ([place])    Bell's palsy    Gout 06/26/2015   Diverticulitis 04/08/2015   DOE (dyspnea on exertion), due to significant CAD 03/28/2015   ESRD on hemodialysis ([place]) 03/04/2015   ESRD on dialysis ([place]) 03/04/2015   Coronary artery disease of bypass graft of native heart with stable angina pectoris ([place]) 09/13/2014   GERD (gastroesophageal reflux disease) 08/07/2012   Major depression, recurrent, full remission ([place]) 08/05/2012   Sleep apnea 08/05/2012   Type II diabetes mellitus ([place]) 08/05/2012   Essential hypertension 08/05/2012   Depression 08/05/2012   Ischemic cardiomyopathy 11/03/2011   Atrioventricular block, complete (HCC)    Obesity (BMI 30-39.9)    Pacemaker    PCP:  [name], MD Pharmacy:   [facility], [place] [address] Phone: [phone] Fax: [place] [address] [place] [address] Phone: [phone] Fax: [phone]     Social Determinants of Health (SDOH) Interventions    Readmission Risk Interventions    05/08/2022    4:03 PM 04/02/2022    3:35 PM  Readmission Risk Prevention Plan  Transportation Screening Complete Complete  Medication Review ([place]) Complete Complete  PCP or Specialist appointment within 3-5 days of discharge  Complete  HRI or [place] Complete Complete  SW Recovery Care/Counseling Consult Complete Complete  Palliative Care Screening Not Applicable Not [place] Not Applicable Not Applicable

## 2022-06-05 NOTE — Progress Notes (Signed)
Patients son [name] passed away unexpected while visiting father in law, Daughter in law [name]  information under contacts stated please contact her with any concerns regarding patient.   Brysan Mcevoy, Tivis Ringer, RN

## 2022-06-05 NOTE — Procedures (Signed)
I was present at this dialysis session. I have reviewed the session itself and made appropriate changes.   Doing well this AM.  Pending SNF for dispo.  2K bath UF goal 1.5L using AVF.  No issues.   Filed Weights   06/02/22 0801 06/02/22 1221 06/05/22 0841  Weight: 92 kg 90.1 kg 89.7 kg    Recent Labs  Lab 06/05/22 0318  NA 140  K 4.6  CL 92*  CO2 26  GLUCOSE 127*  BUN 68*  CREATININE 9.63*  CALCIUM 9.7  PHOS 6.1*    Recent Labs  Lab 05/30/22 0219 06/01/22 0248 06/02/22 1600 06/05/22 0318  WBC 6.1 9.4  --  8.1  HGB 9.3* 10.3* 11.9* 9.3*  HCT 27.3* 31.3* 35.0* 28.7*  MCV 103.8* 103.6*  --  101.8*  PLT 120* 174  --  312    Scheduled Meds:  (feeding supplement) PROSource Plus  30 mL Oral BID BM   acidophilus  1 capsule Oral Daily   aspirin EC  81 mg Oral BID   atorvastatin  80 mg Oral QHS   calcitRIOL  1 mcg Oral Q M,W,F-HD   [START ON 06/06/2022] carvedilol  3.125 mg Oral 2 times per day on Sun Tue Thu Sat   carvedilol  3.125 mg Oral Once per day on Mon Wed Fri   Chlorhexidine Gluconate Cloth  6 each Topical Q0600   Chlorhexidine Gluconate Cloth  6 each Topical Q0600   cyanocobalamin  1,000 mcg Oral Daily   darbepoetin (ARANESP) injection - DIALYSIS  100 mcg Subcutaneous Q Tue-1800   docusate sodium  100 mg Oral BID   ezetimibe  10 mg Oral Daily   gabapentin  100 mg Oral BID   Followed by   gabapentin  100 mg Oral QHS   heparin  5,000 Units Subcutaneous Q8H   isosorbide mononitrate  15 mg Oral Daily   melatonin  3 mg Oral QHS   midodrine  10 mg Oral Q M,W,F   midodrine  10 mg Oral Once in dialysis   multivitamin  1 tablet Oral Daily   pantoprazole  40 mg Oral Daily   senna  1 tablet Oral QHS   sevelamer carbonate  2,400 mg Oral BID WC   ticagrelor  90 mg Oral BID   Continuous Infusions:  anticoagulant sodium citrate     PRN Meds:.acetaminophen, alteplase, anticoagulant sodium citrate, HYDROcodone-acetaminophen, HYDROcodone-acetaminophen, HYDROmorphone  (DILAUDID) injection, lidocaine (PF), lidocaine-prilocaine, methocarbamol, ondansetron **OR** ondansetron (ZOFRAN) IV, pentafluoroprop-tetrafluoroeth, polyvinyl alcohol, sevelamer carbonate   Pearson Grippe  MD 06/05/2022, 9:37 AM

## 2022-06-05 NOTE — Progress Notes (Signed)
Per renal provider pt's run time has been decreased to 3.0 hours.

## 2022-06-06 DIAGNOSIS — N186 End stage renal disease: Secondary | ICD-10-CM | POA: Diagnosis not present

## 2022-06-06 DIAGNOSIS — I255 Ischemic cardiomyopathy: Secondary | ICD-10-CM | POA: Diagnosis not present

## 2022-06-06 DIAGNOSIS — S82892A Other fracture of left lower leg, initial encounter for closed fracture: Secondary | ICD-10-CM | POA: Diagnosis not present

## 2022-06-06 DIAGNOSIS — Z95810 Presence of automatic (implantable) cardiac defibrillator: Secondary | ICD-10-CM | POA: Diagnosis not present

## 2022-06-06 LAB — RENAL FUNCTION PANEL
Albumin: 2.7 g/dL — ABNORMAL LOW (ref 3.5–5.0)
Anion gap: 16 — ABNORMAL HIGH (ref 5–15)
BUN: 43 mg/dL — ABNORMAL HIGH (ref 8–23)
CO2: 28 mmol/L (ref 22–32)
Calcium: 9.6 mg/dL (ref 8.9–10.3)
Chloride: 93 mmol/L — ABNORMAL LOW (ref 98–111)
Creatinine, Ser: 6.87 mg/dL — ABNORMAL HIGH (ref 0.61–1.24)
GFR, Estimated: 7 mL/min — ABNORMAL LOW (ref >60)
Glucose, Bld: 92 mg/dL (ref 70–99)
Phosphorus: 5.2 mg/dL — ABNORMAL HIGH (ref 2.5–4.6)
Potassium: 4.5 mmol/L (ref 3.5–5.1)
Sodium: 137 mmol/L (ref 135–145)

## 2022-06-06 NOTE — Progress Notes (Signed)
Physical Therapy Treatment Patient Details Name: Rodney Farley MRN: [id number] DOB: [birth year] Today's Date: 06/06/2022   History of Present Illness 88 y.o. male admitted on 05/28/2022 following a mechanical fall at home, Diagnosed with left ankle fracture.; s/p left bimalleolar ankle fracture with internal fixation of left fibula, with intramedullary implant 11/21; PMH of extensive cardiac history including CAD, NSTEMI, multiple PCI's including most recently on 05/07/2022 when he had PCI with balloon angioplasty for recurrent in-stent stenosis of LAD, ischemic cardiomyopathy, chronic systolic CHF, complete heart block s/p PPM, subclinical atrial fibrillation, moderate aortic stenosis, chronic hypotension, HLD, DM 2, ESRD on HD MWF, OSA, COPD, TIA.    PT Comments    Good progress and effort with physical therapy today. Able to perform stand pivot transfer with Mod assist +2. Demonstrates good RLE and UE strength, but anxious with stand and pivot, requiring assist for RW control and LLE support for NWB precautions. Reviewed exercises and tolerated very well. In recliner with LLE elevated. Scant blood in bed noted after transfer, pt believes from scrotum. RN notified. Patient will continue to benefit from skilled physical therapy services to further improve independence with functional mobility.    Recommendations for follow up therapy are one component of a multi-disciplinary discharge planning process, led by the attending physician.  Recommendations may be updated based on patient status, additional functional criteria and insurance authorization.  Follow Up Recommendations  Skilled nursing-short term rehab (<3 hours/day) Can patient physically be transported by private vehicle: No   Assistance Recommended at Discharge Frequent or constant Supervision/Assistance  Patient can return home with the following Two people to help with walking and/or transfers;A lot of help with  bathing/dressing/bathroom;Assistance with cooking/housework;Assist for transportation   Equipment Recommendations  Wheelchair (measurements PT);Wheelchair cushion (measurements PT)    Recommendations for Other Services OT consult     Precautions / Restrictions Precautions Precautions: Fall Precaution Comments: Premedicate for pain Restrictions Weight Bearing Restrictions: Yes LLE Weight Bearing: Non weight bearing     Mobility  Bed Mobility Overal bed mobility: Needs Assistance Bed Mobility: Supine to Sit     Supine to sit: HOB elevated, Min assist Sit to supine: Min assist   General bed mobility comments: VC for technique, min assist for LLE support and trunk to rise to EOB. Effortful but capable with light guidance. A little unsteady on edge at first but improved.    Transfers Overall transfer level: Needs assistance Equipment used: Rolling walker (2 wheels), 2 person hand held assist Transfers: Sit to/from Stand, Bed to chair/wheelchair/BSC Sit to Stand: +2 physical assistance, +2 safety/equipment, Mod assist, From elevated surface Stand pivot transfers: Min assist, +2 physical assistance         General transfer comment: Mod assist +2 for boost to stand from slightly elevated bed surface, Lt hand on RW, Rt hand on bed. Therapist assisted with Lt foot elevation to prevent WB during transition. Pt anxious upon standing. Able to perform pivot to chair min assist +2 for RW control and WB prevention LE with pt showing some ability to keep LLE elevated. Anxious to sit, but RLE and UEs supported well with use of RW.    Ambulation/Gait                   Stairs             Wheelchair Mobility    Modified Rankin (Stroke Patients Only)       Balance Overall balance assessment: Needs assistance Sitting-balance support: No  upper extremity supported, Feet supported Sitting balance-Leahy Scale: Fair     Standing balance support: Bilateral upper extremity  supported, During functional activity Standing balance-Leahy Scale: Poor Standing balance comment: Stood EOB 30 sec before pivot                            Cognition Arousal/Alertness: Awake/alert Behavior During Therapy: Anxious Overall Cognitive Status: Within Functional Limits for tasks assessed                                 General Comments: anxious and fearful of falling;        Exercises General Exercises - Lower Extremity Ankle Circles/Pumps: AROM, Right, 10 reps, Seated Quad Sets: Strengthening, Both, 10 reps, Seated Long Arc Quad: Strengthening, Both, 10 reps, Seated Hip ABduction/ADduction: Strengthening, Both, 10 reps, Seated Straight Leg Raises: Left, 5 reps, AAROM, Supine Hip Flexion/Marching: Strengthening, Both, 10 reps, Seated    General Comments General comments (skin integrity, edema, etc.): Reports he lost his son suddenly yesterday and is greiving, but motivated to do well with therapy and progress at rehab.      Pertinent Vitals/Pain Pain Assessment Pain Assessment: Faces Faces Pain Scale: Hurts even more Pain Location: Reports less pain, more anxious with Lt ankle movement. Pain Descriptors / Indicators: Grimacing, Guarding Pain Intervention(s): Monitored during session, Premedicated before session, Repositioned, Limited activity within patient's tolerance    Home Living                          Prior Function            PT Goals (current goals can now be found in the care plan section) Acute Rehab PT Goals Patient Stated Goal: independence PT Goal Formulation: With patient Time For Goal Achievement: 06/15/22 Potential to Achieve Goals: Good Progress towards PT goals: Progressing toward goals    Frequency    Min 2X/week      PT Plan Current plan remains appropriate    Co-evaluation              AM-PAC PT "6 Clicks" Mobility   Outcome Measure  Help needed turning from your back to your  side while in a flat bed without using bedrails?: A Little Help needed moving from lying on your back to sitting on the side of a flat bed without using bedrails?: A Lot Help needed moving to and from a bed to a chair (including a wheelchair)?: A Lot Help needed standing up from a chair using your arms (e.g., wheelchair or bedside chair)?: Total Help needed to walk in hospital room?: Total Help needed climbing 3-5 steps with a railing? : Total 6 Click Score: 10    End of Session Equipment Utilized During Treatment: Gait belt Activity Tolerance: Patient tolerated treatment well Patient left: with call bell/phone within reach;in chair;with chair alarm set Nurse Communication: Mobility status (scant blood in bed; pt feels it may be from scrotum.) PT Visit Diagnosis: Other abnormalities of gait and mobility (R26.89);Pain Pain - Right/Left: Left Pain - part of body: Leg;Ankle and joints of foot     Time: 1497-0263 PT Time Calculation (min) (ACUTE ONLY): 29 min  Charges:  $Therapeutic Exercise: 8-22 mins $Therapeutic Activity: 8-22 mins                     Ladonne Sharples  Freddi Starr, PT, DPT Physical Therapist Acute Rehabilitation Services [facility] 06/06/2022, 11:50 AM

## 2022-06-06 NOTE — Progress Notes (Signed)
[facility] Progress Note   Subjective:   Some hypotension overnight. Denies any pain, happy that he can move his leg a bit. Denies SOB, CP, dizziness, abdominal pain and nausea.   Objective Vitals:   06/05/22 1229 06/05/22 1726 06/05/22 2122 06/06/22 0500  BP: (!) 130/59 (!) 87/45 (!) 91/54 (!) 114/45  Pulse: 80 78 78 60  Resp:   18 16  Temp: 98.2 F (36.8 C) 98.1 F (36.7 C) 98.2 F (36.8 C) 97.9 F (36.6 C)  TempSrc: Oral Oral Oral Oral  SpO2: 94% 95% 95% 96%  Weight:      Height:       Physical Exam General: Alert male in NAD Heart: RRR, no murmurs, rubs or gallops Lungs: CTA bilaterally without wheezing, rhonchi or rales Abdomen: Soft, non-distended, +BS Extremities: L foot wrapped, no edema RLE Dialysis Access: RUE AVF + bruit  Additional Objective Labs: Basic Metabolic Panel: Recent Labs  Lab 06/04/22 0250 06/05/22 0318 06/06/22 0058  NA 135 140 137  K 4.0 4.6 4.5  CL 89* 92* 93*  CO2 26 26 28   GLUCOSE 120* 127* 92  BUN 46* 68* 43*  CREATININE 7.70* 9.63* 6.87*  CALCIUM 9.4 9.7 9.6  PHOS 5.4* 6.1* 5.2*   Liver Function Tests: Recent Labs  Lab 06/04/22 0250 06/05/22 0318 06/06/22 0058  ALBUMIN 2.7* 2.6* 2.7*   No results for input(s): "LIPASE", "AMYLASE" in the last 168 hours. CBC: Recent Labs  Lab 06/01/22 0248 06/02/22 1600 06/05/22 0318  WBC 9.4  --  8.1  HGB 10.3* 11.9* 9.3*  HCT 31.3* 35.0* 28.7*  MCV 103.6*  --  101.8*  PLT 174  --  312   Blood Culture    Component Value Date/Time   SDES URINE, CLEAN CATCH 04/07/2019 1931   SPECREQUEST  04/07/2019 1931    NONE Performed at [facility], [place] [address], [place], [place] [address]    CULT >=100,000 COLONIES/mL PSEUDOMONAS AERUGINOSA (A) 04/07/2019 1931   REPTSTATUS 04/09/2019 FINAL 04/07/2019 1931    Cardiac Enzymes: No results for input(s): "CKTOTAL", "CKMB", "CKMBINDEX", "TROPONINI" in the last 168 hours. CBG: Recent Labs  Lab 06/02/22 1802   GLUCAP 115*   Iron Studies: No results for input(s): "IRON", "TIBC", "TRANSFERRIN", "FERRITIN" in the last 72 hours. @lablastinr3 @ Studies/Results: No results found. Medications:   (feeding supplement) PROSource Plus  30 mL Oral BID BM   acidophilus  1 capsule Oral Daily   aspirin EC  81 mg Oral BID   atorvastatin  80 mg Oral QHS   calcitRIOL  1 mcg Oral Q M,W,F-HD   carvedilol  3.125 mg Oral 2 times per day on Sun Tue Thu Sat   carvedilol  3.125 mg Oral Once per day on Mon Wed Fri   Chlorhexidine Gluconate Cloth  6 each Topical Q0600   Chlorhexidine Gluconate Cloth  6 each Topical Q0600   cyanocobalamin  1,000 mcg Oral Daily   darbepoetin (ARANESP) injection - DIALYSIS  100 mcg Subcutaneous Q Tue-1800   docusate sodium  100 mg Oral BID   ezetimibe  10 mg Oral Daily   gabapentin  100 mg Oral QHS   heparin  5,000 Units Subcutaneous Q8H   isosorbide mononitrate  15 mg Oral Daily   melatonin  3 mg Oral QHS   midodrine  10 mg Oral Q M,W,F   midodrine  10 mg Oral Once in dialysis   multivitamin  1 tablet Oral Daily   pantoprazole  40 mg  Oral Daily   senna  1 tablet Oral QHS   sevelamer carbonate  2,400 mg Oral BID WC   ticagrelor  90 mg Oral BID    Dialysis Orders: MWF NW  4h   400/500  94kg  2/2 bath  Hep 2500+ 1064midrun  RUA AVG - last HD 11/15, post 95.4kg - calcitriol 1.75 ug po tiw - mircera 39mcg q4, last 11/6  Assessment/Plan: L ankle fracture: S/p ORIF 11/21 - pain control per primary. ESRD: Usual MWF schedule - following Thanksgiving Holiday schedule this week: Sun/Tues/Fri. Now back on MWF schedule Hypertension/volume: On midodrine pre-HD (as well as BB due to CAD). He does not appear overloaded at this time.   HFrEF - EF 45-50% CAD/multiple PCI/stent: recurrent unstable angina, followed by cardiology.  Anemia of ESRD: Hgb 9.3- Aranesp 19mcg started q Tuesday Secondary HPTH: Phos controlled. Corrected calcium elevated, decreased VDRA DM2 - on insulin, per  pmd Nutrition - Renal diet w/fluid restrictions, continue supplements.      Anice Paganini, PA-C 06/06/2022, 8:40 AM  [facility] Pager: [phone]

## 2022-06-06 NOTE — Progress Notes (Signed)
Subjective: 4 Days Post-Op Procedure(s) (LRB): OPEN REDUCTION INTERNAL FIXATION (ORIF) LEFT LATERAL MALLEOLUS (Left)  Patient asleep in bed this morning. No real pain.   Activity level:  nonweightbearing left leg. Diet tolerance:  ok Voiding:  ok Patient reports pain as mild.    Objective: Vital signs in last 24 hours: Temp:  [97.9 F (36.6 C)-98.6 F (37 C)] 97.9 F (36.6 C) (11/25 0500) Pulse Rate:  [60-81] 60 (11/25 0500) Resp:  [12-23] 16 (11/25 0500) BP: (76-130)/(44-59) 114/45 (11/25 0500) SpO2:  [90 %-99 %] 96 % (11/25 0500) Weight:  [89.7 kg] 89.7 kg (11/24 0841)  Labs: Recent Labs    06/05/22 0318  HGB 9.3*   Recent Labs    06/05/22 0318  WBC 8.1  RBC 2.82*  HCT 28.7*  PLT 312   Recent Labs    06/05/22 0318 06/06/22 0058  NA 140 137  K 4.6 4.5  CL 92* 93*  CO2 26 28  BUN 68* 43*  CREATININE 9.63* 6.87*  GLUCOSE 127* 92  CALCIUM 9.7 9.6   No results for input(s): "LABPT", "INR" in the last 72 hours.  Physical Exam:  Neurologically intact ABD soft Neurovascular intact Sensation intact distally Intact pulses distally Dorsiflexion/Plantar flexion intact Incision: dressing C/D/I and no drainage No cellulitis present Compartment soft  Assessment/Plan:  4 Days Post-Op Procedure(s) (LRB): OPEN REDUCTION INTERNAL FIXATION (ORIF) LEFT LATERAL MALLEOLUS (Left)  Mobility: Out of bed with PT/OT; elevate left lower extremity higher than heart is much as possible Pain control: Continue to wean/titrate to appropriate oral regimen DVT Prophylaxis: 81 mg aspirin twice daily for 2 weeks LLE: Nonweightbearing Dressing care: Keep splint clean, dry, and intact.  No dressing changes are required.  This will be removed in the office.  If the splint feels too tight, you may unwrap the Ace wrap, gently pull on the plaster/cotton, and rewrap the Ace a little bit looser.  It does need to be tight enough to keep the plaster splint on and in the right place, but  sometimes postoperative swelling can make this tighter than it was in the operating room. Disposition: Per primary team as medically appropriate Follow-up: Please call [facility] [phone]) to schedule follow-op appointment for 2 weeks after surgery   Rx for Norco on chart   Larwance Sachs Jeliyah Middlebrooks 06/06/2022, 8:21 AM

## 2022-06-06 NOTE — Progress Notes (Signed)
PROGRESS NOTE    Rodney Farley  CWC:[id number] DOB: 1931/12/20 DOA: 05/28/2022 PCP: [name], MD    Brief Narrative:  88 year old from home with extensive cardiac history including coronary artery disease, non-STEMI and multiple PCI's with recent PCI on 05/07/2022, recurrent in-stent restenosis of LAD, ischemic cardiomyopathy, chronic combined heart failure, complete heart block status post ICD CRT, ESRD on hemodialysis and obstructive sleep apnea presented to the emergency room on 11/17 following a mechanical fall at home the night prior and severe pain in his left ankle.  Patient was found to have left ankle fracture and admitted to the hospital.  After medically optimizing, patient underwent ORIF left ankle 11/21. Medically stabilizing.  Waiting for SNF bed.   Assessment & Plan:   Closed traumatic fracture and dislocation of the left ankle: ORIF 11/20 Dr. Mable Fill Nonweightbearing left lower extremity DVT prophylaxis with SCDs, aspirin 81 mg twice daily for 2 weeks. Adequate IV and oral opiates for pain relief, mobilize with PT OT.   Ortho to schedule postop follow-up. Stable to transfer to SNF.  ESRD on hemodialysis Chronic hypotension on midodrine with dialysis Chronic combined heart failure and ischemic cardiomyopathy status post AICD Coronary artery disease and multiple PCI's with recent PCI:  Chronic and stable.  Receiving dialysis on his schedule and followed by nephrology. Blood pressures are stable. Euvolemic and fluid balance maintained with hemodialysis. Recent LAD in-stent restenosis status post PCI on aspirin, Brilinta and a statin.  Patient is on atorvastatin and Zetia.  Repatha as outpatient.  Untreated sleep apnea.  Untreated.  He does not use CPAP.  Medically stabilizing.  Transfer to SNF when bed available.    DVT prophylaxis: SCDs Start: 06/02/22 1842 heparin injection 5,000 Units Start: 05/29/22 0915   Code Status: Full code. Family  Communication: None. Disposition Plan: Status is: Inpatient Remains inpatient appropriate because: Waiting for skilled bed.     Consultants:  Nephrology Orthopedics Cardiology  Procedures:  ORIF left ankle 11/21  Antimicrobials:  Perioperative   Subjective:  Patient seen and examined.  He is only son died yesterday.  He was very upset and emotional.  Support provided with active listening and support.  He denies any significant pain.  Objective: Vitals:   06/05/22 1726 06/05/22 2122 06/06/22 0500 06/06/22 0855  BP: (!) 87/45 (!) 91/54 (!) 114/45 (!) 110/49  Pulse: 78 78 60 81  Resp:  18 16 16   Temp: 98.1 F (36.7 C) 98.2 F (36.8 C) 97.9 F (36.6 C) 98 F (36.7 C)  TempSrc: Oral Oral Oral Oral  SpO2: 95% 95% 96% 93%  Weight:      Height:        Intake/Output Summary (Last 24 hours) at 06/06/2022 1350 Last data filed at 06/06/2022 1300 Gross per 24 hour  Intake 360 ml  Output 0 ml  Net 360 ml   Filed Weights   06/02/22 0801 06/02/22 1221 06/05/22 0841  Weight: 92 kg 90.1 kg 89.7 kg    Examination:  Comfortable but anxious and emotional today. AV fistula right upper extremity. Left leg in cast, distal neurovascular status intact.   Data Reviewed: I have personally reviewed following labs and imaging studies  CBC: Recent Labs  Lab 06/01/22 0248 06/02/22 1600 06/05/22 0318  WBC 9.4  --  8.1  HGB 10.3* 11.9* 9.3*  HCT 31.3* 35.0* 28.7*  MCV 103.6*  --  101.8*  PLT 174  --  761   Basic Metabolic Panel: Recent Labs  Lab 06/02/22 0425  06/02/22 1600 06/03/22 0344 06/04/22 0250 06/05/22 0318 06/06/22 0058  NA 136 134* 137 135 140 137  K 4.6 4.2 4.3 4.0 4.6 4.5  CL 90* 93* 90* 89* 92* 93*  CO2 27  --  27 26 26 28   GLUCOSE 104* 114* 113* 120* 127* 92  BUN 39* 19 28* 46* 68* 43*  CREATININE 7.37* 4.30* 5.32* 7.70* 9.63* 6.87*  CALCIUM 9.4  --  9.4 9.4 9.7 9.6  PHOS 5.1*  --  4.9* 5.4* 6.1* 5.2*   GFR: Estimated Creatinine Clearance: 7.9  mL/min (A) (by C-G formula based on SCr of 6.87 mg/dL (H)). Liver Function Tests: Recent Labs  Lab 06/02/22 0425 06/03/22 0344 06/04/22 0250 06/05/22 0318 06/06/22 0058  ALBUMIN 2.9* 2.8* 2.7* 2.6* 2.7*   No results for input(s): "LIPASE", "AMYLASE" in the last 168 hours. No results for input(s): "AMMONIA" in the last 168 hours. Coagulation Profile: No results for input(s): "INR", "PROTIME" in the last 168 hours. Cardiac Enzymes: No results for input(s): "CKTOTAL", "CKMB", "CKMBINDEX", "TROPONINI" in the last 168 hours. BNP (last 3 results) No results for input(s): "PROBNP" in the last 8760 hours. HbA1C: No results for input(s): "HGBA1C" in the last 72 hours. CBG: Recent Labs  Lab 06/02/22 1802  GLUCAP 115*   Lipid Profile: No results for input(s): "CHOL", "HDL", "LDLCALC", "TRIG", "CHOLHDL", "LDLDIRECT" in the last 72 hours. Thyroid Function Tests: No results for input(s): "TSH", "T4TOTAL", "FREET4", "T3FREE", "THYROIDAB" in the last 72 hours. Anemia Panel: No results for input(s): "VITAMINB12", "FOLATE", "FERRITIN", "TIBC", "IRON", "RETICCTPCT" in the last 72 hours. Sepsis Labs: No results for input(s): "PROCALCITON", "LATICACIDVEN" in the last 168 hours.  No results found for this or any previous visit (from the past 240 hour(s)).       Radiology Studies: No results found.      Scheduled Meds:  (feeding supplement) PROSource Plus  30 mL Oral BID BM   acidophilus  1 capsule Oral Daily   aspirin EC  81 mg Oral BID   atorvastatin  80 mg Oral QHS   calcitRIOL  1 mcg Oral Q M,W,F-HD   carvedilol  3.125 mg Oral 2 times per day on Sun Tue Thu Sat   carvedilol  3.125 mg Oral Once per day on Mon Wed Fri   Chlorhexidine Gluconate Cloth  6 each Topical Q0600   Chlorhexidine Gluconate Cloth  6 each Topical Q0600   cyanocobalamin  1,000 mcg Oral Daily   darbepoetin (ARANESP) injection - DIALYSIS  100 mcg Subcutaneous Q Tue-1800   docusate sodium  100 mg Oral BID    ezetimibe  10 mg Oral Daily   gabapentin  100 mg Oral QHS   heparin  5,000 Units Subcutaneous Q8H   isosorbide mononitrate  15 mg Oral Daily   melatonin  3 mg Oral QHS   midodrine  10 mg Oral Q M,W,F   midodrine  10 mg Oral Once in dialysis   multivitamin  1 tablet Oral Daily   pantoprazole  40 mg Oral Daily   senna  1 tablet Oral QHS   sevelamer carbonate  2,400 mg Oral BID WC   ticagrelor  90 mg Oral BID   Continuous Infusions:     LOS: 8 days    Time spent: 25 minutes    Barb Merino, MD [facility] Pager [phone]

## 2022-06-06 NOTE — Progress Notes (Signed)
   06/06/22 1650  Clinical Encounter Type  Visited With Patient;Health care provider  Visit Type Spiritual support;Initial  Referral From Patient;Nurse  Stress Factors  Patient Stress Factors Loss   Chaplain responded to a consult. Patient's son passed away unexpectedly yesterday. Chaplain offered listening, support and prayer. Chaplain introduced spiritual care services. Spiritual care services available as needed.   Jeri Lager, Chaplain 06/06/22

## 2022-06-07 DIAGNOSIS — S82892A Other fracture of left lower leg, initial encounter for closed fracture: Secondary | ICD-10-CM | POA: Diagnosis not present

## 2022-06-07 DIAGNOSIS — Z95810 Presence of automatic (implantable) cardiac defibrillator: Secondary | ICD-10-CM | POA: Diagnosis not present

## 2022-06-07 DIAGNOSIS — I255 Ischemic cardiomyopathy: Secondary | ICD-10-CM | POA: Diagnosis not present

## 2022-06-07 DIAGNOSIS — N186 End stage renal disease: Secondary | ICD-10-CM | POA: Diagnosis not present

## 2022-06-07 LAB — RENAL FUNCTION PANEL
Albumin: 2.7 g/dL — ABNORMAL LOW (ref 3.5–5.0)
Anion gap: 18 — ABNORMAL HIGH (ref 5–15)
BUN: 73 mg/dL — ABNORMAL HIGH (ref 8–23)
CO2: 27 mmol/L (ref 22–32)
Calcium: 9.4 mg/dL (ref 8.9–10.3)
Chloride: 92 mmol/L — ABNORMAL LOW (ref 98–111)
Creatinine, Ser: 9.52 mg/dL — ABNORMAL HIGH (ref 0.61–1.24)
GFR, Estimated: 5 mL/min — ABNORMAL LOW (ref >60)
Glucose, Bld: 131 mg/dL — ABNORMAL HIGH (ref 70–99)
Phosphorus: 6.1 mg/dL — ABNORMAL HIGH (ref 2.5–4.6)
Potassium: 4.4 mmol/L (ref 3.5–5.1)
Sodium: 137 mmol/L (ref 135–145)

## 2022-06-07 LAB — HEPATITIS B SURFACE ANTIGEN: Hepatitis B Surface Ag: NONREACTIVE

## 2022-06-07 LAB — GLUCOSE, CAPILLARY: Glucose-Capillary: 174 mg/dL — ABNORMAL HIGH (ref 70–99)

## 2022-06-07 MED ORDER — CALCITRIOL 0.5 MCG PO CAPS: 0.5000 ug | ORAL_CAPSULE | ORAL | Status: DC

## 2022-06-07 MED ORDER — CHLORHEXIDINE GLUCONATE CLOTH 2 % EX PADS: 6.0000 | MEDICATED_PAD | Freq: Every day | CUTANEOUS | Status: DC

## 2022-06-07 NOTE — Progress Notes (Signed)
[facility] Progress Note   Subjective:   Upset this AM because he feels he was rude to staff today. Reassurance provided and members of his church are visiting him shortly. Denies SOB, CP, palpitations, dizziness and nausea.   Objective Vitals:   06/06/22 1610 06/06/22 1947 06/07/22 0524 06/07/22 0834  BP: (!) 95/49 (!) 101/52 (!) 98/51 (!) 114/54  Pulse: 80 81 63 79  Resp: 16 18 16 18   Temp: 98.2 F (36.8 C) 98.2 F (36.8 C) 97.6 F (36.4 C) 97.6 F (36.4 C)  TempSrc: Oral Oral Oral Oral  SpO2: 91% 96% 94% 98%  Weight:      Height:       Physical Exam General: Alert male, tearful, otherwise NAD Heart: RRR, no murmurs, rubs or gallops Lungs: CTA bilaterally without wheezing, rhonchi or rales Abdomen: Soft, non-distended, +BS Extremities: L foot wrapped, no edema RLE Dialysis Access: RUE AVF + bruit    Additional Objective Labs: Basic Metabolic Panel: Recent Labs  Lab 06/05/22 0318 06/06/22 0058 06/07/22 0556  NA 140 137 137  K 4.6 4.5 4.4  CL 92* 93* 92*  CO2 26 28 27   GLUCOSE 127* 92 131*  BUN 68* 43* 73*  CREATININE 9.63* 6.87* 9.52*  CALCIUM 9.7 9.6 9.4  PHOS 6.1* 5.2* 6.1*   Liver Function Tests: Recent Labs  Lab 06/05/22 0318 06/06/22 0058 06/07/22 0556  ALBUMIN 2.6* 2.7* 2.7*   No results for input(s): "LIPASE", "AMYLASE" in the last 168 hours. CBC: Recent Labs  Lab 06/01/22 0248 06/02/22 1600 06/05/22 0318  WBC 9.4  --  8.1  HGB 10.3* 11.9* 9.3*  HCT 31.3* 35.0* 28.7*  MCV 103.6*  --  101.8*  PLT 174  --  312   Blood Culture    Component Value Date/Time   SDES URINE, CLEAN CATCH 04/07/2019 1931   SPECREQUEST  04/07/2019 1931    NONE Performed at [facility], [place] [address] [place], [place] [address]    CULT >=100,000 COLONIES/mL PSEUDOMONAS AERUGINOSA (A) 04/07/2019 1931   REPTSTATUS 04/09/2019 FINAL 04/07/2019 1931    Cardiac Enzymes: No results for input(s): "CKTOTAL", "CKMB", "CKMBINDEX", "TROPONINI"  in the last 168 hours. CBG: Recent Labs  Lab 06/02/22 1802  GLUCAP 115*   Iron Studies: No results for input(s): "IRON", "TIBC", "TRANSFERRIN", "FERRITIN" in the last 72 hours. @lablastinr3 @ Studies/Results: No results found. Medications:   (feeding supplement) PROSource Plus  30 mL Oral BID BM   acidophilus  1 capsule Oral Daily   aspirin EC  81 mg Oral BID   atorvastatin  80 mg Oral QHS   calcitRIOL  1 mcg Oral Q M,W,F-HD   carvedilol  3.125 mg Oral 2 times per day on Sun Tue Thu Sat   carvedilol  3.125 mg Oral Once per day on Mon Wed Fri   Chlorhexidine Gluconate Cloth  6 each Topical Q0600   Chlorhexidine Gluconate Cloth  6 each Topical Q0600   cyanocobalamin  1,000 mcg Oral Daily   darbepoetin (ARANESP) injection - DIALYSIS  100 mcg Subcutaneous Q Tue-1800   docusate sodium  100 mg Oral BID   ezetimibe  10 mg Oral Daily   gabapentin  100 mg Oral QHS   heparin  5,000 Units Subcutaneous Q8H   isosorbide mononitrate  15 mg Oral Daily   melatonin  3 mg Oral QHS   midodrine  10 mg Oral Q M,W,F   midodrine  10 mg Oral Once in dialysis   multivitamin  1 tablet Oral Daily   pantoprazole  40 mg Oral Daily   senna  1 tablet Oral QHS   sevelamer carbonate  2,400 mg Oral BID WC   ticagrelor  90 mg Oral BID    Dialysis Orders: MWF NW  4h   400/500  94kg  2/2 bath  Hep 2500+ 1069midrun  RUA AVG - last HD 11/15, post 95.4kg - calcitriol 1.75 ug po tiw - mircera 28mcg q4, last 11/6  Assessment/Plan: L ankle fracture: S/p ORIF 11/21 - pain control per primary. ESRD: Now back on MWF schedule, next HD tomorrow Hypertension/volume: On midodrine pre-HD (as well as BB due to CAD). He does not appear overloaded at this time.   HFrEF - EF 45-50% CAD/multiple PCI/stent: recurrent unstable angina, followed by cardiology.  Anemia of ESRD: Hgb 9.3- Aranesp 17mcg started q Tuesday Secondary HPTH: Phos mildly elevated, resumed binders. Corrected calcium elevated, decreased VDRA further  to 0.38mcg DM2 - on insulin, per pmd Nutrition - Renal diet w/fluid restrictions, continue supplements.  Anice Paganini, PA-C 06/07/2022, 10:29 AM  [facility] Pager: [phone]

## 2022-06-07 NOTE — Progress Notes (Signed)
PROGRESS NOTE    Rodney Farley  VZC:[id number] DOB: 04-Sep-1931 DOA: 05/28/2022 PCP: Rodney Olp, MD    Brief Narrative:  88 year old from home with extensive cardiac history including coronary artery disease, non-STEMI and multiple PCI's with recent PCI on 05/07/2022, recurrent in-stent restenosis of LAD, ischemic cardiomyopathy, chronic combined heart failure, complete heart block status post ICD CRT, ESRD on hemodialysis and obstructive sleep apnea presented to the emergency room on 11/17 following a mechanical fall at home the night prior and severe pain in his left ankle.  Patient was found to have left ankle fracture and admitted to the hospital.  After medically optimizing, patient underwent ORIF left ankle 11/21. Medically stabilizing.  Waiting for SNF bed.   Assessment & Plan:   Closed traumatic fracture and dislocation of the left ankle: ORIF 11/20 Dr. Mable Fill Nonweightbearing left lower extremity DVT prophylaxis with SCDs, aspirin 81 mg twice daily for 2 weeks. Adequate IV and oral opiates for pain relief, mobilize with PT OT.   Ortho to schedule postop follow-up. Stable to transfer to SNF.  ESRD on hemodialysis Chronic hypotension on midodrine with dialysis Chronic combined heart failure and ischemic cardiomyopathy status post AICD Coronary artery disease and multiple PCI's with recent PCI:  Chronic and stable.  Receiving dialysis on his schedule and followed by nephrology. Blood pressures are stable. Euvolemic and fluid balance maintained with hemodialysis. Recent LAD in-stent restenosis status post PCI on aspirin, Brilinta and a statin.  Patient is on atorvastatin and Zetia.  Repatha as outpatient.  Untreated sleep apnea.  Untreated.  He does not use CPAP.  Medically stabilizing.  Transfer to SNF when bed available.    DVT prophylaxis: SCDs Start: 06/02/22 1842 heparin injection 5,000 Units Start: 05/29/22 0915   Code Status: Full code. Family  Communication: None. Disposition Plan: Status is: Inpatient Remains inpatient appropriate because: Waiting for skilled bed.     Consultants:  Nephrology Orthopedics Cardiology  Procedures:  ORIF left ankle 11/21  Antimicrobials:  Perioperative   Subjective:  Patient seen and examined.  He was quite upset with old events overnight overnight.  Active listening and counseling done.  Fairly comfortable with plans now.  Objective: Vitals:   06/06/22 1610 06/06/22 1947 06/07/22 0524 06/07/22 0834  BP: (!) 95/49 (!) 101/52 (!) 98/51 (!) 114/54  Pulse: 80 81 63 79  Resp: 16 18 16 18   Temp: 98.2 F (36.8 C) 98.2 F (36.8 C) 97.6 F (36.4 C) 97.6 F (36.4 C)  TempSrc: Oral Oral Oral Oral  SpO2: 91% 96% 94% 98%  Weight:      Height:        Intake/Output Summary (Last 24 hours) at 06/07/2022 1425 Last data filed at 06/07/2022 0900 Gross per 24 hour  Intake 720 ml  Output --  Net 720 ml    Filed Weights   06/02/22 0801 06/02/22 1221 06/05/22 0841  Weight: 92 kg 90.1 kg 89.7 kg    Examination:  Comfortable but anxious and emotional . AV fistula right upper extremity. Left leg in cast, distal neurovascular status intact.   Data Reviewed: I have personally reviewed following labs and imaging studies  CBC: Recent Labs  Lab 06/01/22 0248 06/02/22 1600 06/05/22 0318  WBC 9.4  --  8.1  HGB 10.3* 11.9* 9.3*  HCT 31.3* 35.0* 28.7*  MCV 103.6*  --  101.8*  PLT 174  --  128    Basic Metabolic Panel: Recent Labs  Lab 06/03/22 0344 06/04/22 0250 06/05/22 0318 06/06/22  0058 06/07/22 0556  NA 137 135 140 137 137  K 4.3 4.0 4.6 4.5 4.4  CL 90* 89* 92* 93* 92*  CO2 27 26 26 28 27   GLUCOSE 113* 120* 127* 92 131*  BUN 28* 46* 68* 43* 73*  CREATININE 5.32* 7.70* 9.63* 6.87* 9.52*  CALCIUM 9.4 9.4 9.7 9.6 9.4  PHOS 4.9* 5.4* 6.1* 5.2* 6.1*    GFR: Estimated Creatinine Clearance: 5.7 mL/min (A) (by C-G formula based on SCr of 9.52 mg/dL (H)). Liver Function  Tests: Recent Labs  Lab 06/03/22 0344 06/04/22 0250 06/05/22 0318 06/06/22 0058 06/07/22 0556  ALBUMIN 2.8* 2.7* 2.6* 2.7* 2.7*    No results for input(s): "LIPASE", "AMYLASE" in the last 168 hours. No results for input(s): "AMMONIA" in the last 168 hours. Coagulation Profile: No results for input(s): "INR", "PROTIME" in the last 168 hours. Cardiac Enzymes: No results for input(s): "CKTOTAL", "CKMB", "CKMBINDEX", "TROPONINI" in the last 168 hours. BNP (last 3 results) No results for input(s): "PROBNP" in the last 8760 hours. HbA1C: No results for input(s): "HGBA1C" in the last 72 hours. CBG: Recent Labs  Lab 06/02/22 1802  GLUCAP 115*    Lipid Profile: No results for input(s): "CHOL", "HDL", "LDLCALC", "TRIG", "CHOLHDL", "LDLDIRECT" in the last 72 hours. Thyroid Function Tests: No results for input(s): "TSH", "T4TOTAL", "FREET4", "T3FREE", "THYROIDAB" in the last 72 hours. Anemia Panel: No results for input(s): "VITAMINB12", "FOLATE", "FERRITIN", "TIBC", "IRON", "RETICCTPCT" in the last 72 hours. Sepsis Labs: No results for input(s): "PROCALCITON", "LATICACIDVEN" in the last 168 hours.  No results found for this or any previous visit (from the past 240 hour(s)).       Radiology Studies: No results found.      Scheduled Meds:  (feeding supplement) PROSource Plus  30 mL Oral BID BM   acidophilus  1 capsule Oral Daily   aspirin EC  81 mg Oral BID   atorvastatin  80 mg Oral QHS   [START ON 06/08/2022] calcitRIOL  0.5 mcg Oral Q M,W,F-HD   carvedilol  3.125 mg Oral 2 times per day on Sun Tue Thu Sat   carvedilol  3.125 mg Oral Once per day on Mon Wed Fri   Chlorhexidine Gluconate Cloth  6 each Topical Q0600   Chlorhexidine Gluconate Cloth  6 each Topical Q0600   cyanocobalamin  1,000 mcg Oral Daily   darbepoetin (ARANESP) injection - DIALYSIS  100 mcg Subcutaneous Q Tue-1800   docusate sodium  100 mg Oral BID   ezetimibe  10 mg Oral Daily   gabapentin  100  mg Oral QHS   heparin  5,000 Units Subcutaneous Q8H   isosorbide mononitrate  15 mg Oral Daily   melatonin  3 mg Oral QHS   midodrine  10 mg Oral Q M,W,F   midodrine  10 mg Oral Once in dialysis   multivitamin  1 tablet Oral Daily   pantoprazole  40 mg Oral Daily   senna  1 tablet Oral QHS   sevelamer carbonate  2,400 mg Oral BID WC   ticagrelor  90 mg Oral BID   Continuous Infusions:     LOS: 9 days    Time spent: 25 minutes    Barb Merino, MD [facility] Pager [phone]

## 2022-06-07 NOTE — Progress Notes (Signed)
Subjective: 5 Days Post-Op Procedure(s) (LRB): OPEN REDUCTION INTERNAL FIXATION (ORIF) LEFT LATERAL MALLEOLUS (Left)  Patient resting comfortably in bed this morning. He states that he has had a terrible time with getting moved from the bed and the chair. He is much more agitated this morning. He is asking that everyone is properly trained who moves him.   Activity level:  nonweightbearing left leg Diet tolerance:  ok Voiding:  ok Patient reports pain as mild.    Objective: Vital signs in last 24 hours: Temp:  [97.6 F (36.4 C)-98.2 F (36.8 C)] 97.6 F (36.4 C) (11/26 0524) Pulse Rate:  [63-81] 63 (11/26 0524) Resp:  [16-18] 16 (11/26 0524) BP: (95-110)/(49-52) 98/51 (11/26 0524) SpO2:  [91 %-96 %] 94 % (11/26 0524)  Labs: Recent Labs    06/05/22 0318  HGB 9.3*   Recent Labs    06/05/22 0318  WBC 8.1  RBC 2.82*  HCT 28.7*  PLT 312   Recent Labs    06/06/22 0058 06/07/22 0556  NA 137 137  K 4.5 4.4  CL 93* 92*  CO2 28 27  BUN 43* 73*  CREATININE 6.87* 9.52*  GLUCOSE 92 131*  CALCIUM 9.6 9.4   No results for input(s): "LABPT", "INR" in the last 72 hours.  Physical Exam:  Neurologically intact ABD soft Neurovascular intact Sensation intact distally  Assessment/Plan:  5 Days Post-Op Procedure(s) (LRB): OPEN REDUCTION INTERNAL FIXATION (ORIF) LEFT LATERAL MALLEOLUS (Left) I informed patient that I would make sure that everyone who helps move him knows he is having pain and knows how to move him. I told him that while in the splint with gentle moving help his leg can not be damaged. I believe that he is struggling a little more today emotionally due to the acute loss of his son and also all of the pain medication. I am hopeful that PT can come and work with him today incase there are any more questions about how to move him properly.  Mobility: Out of bed with PT/OT; elevate left lower extremity higher than heart is much as possible Pain control: Continue to  wean/titrate to appropriate oral regimen DVT Prophylaxis: 81 mg aspirin twice daily for 2 weeks LLE: Nonweightbearing Dressing care: Keep splint clean, dry, and intact.  No dressing changes are required.  This will be removed in the office.  If the splint feels too tight, you may unwrap the Ace wrap, gently pull on the plaster/cotton, and rewrap the Ace a little bit looser.  It does need to be tight enough to keep the plaster splint on and in the right place, but sometimes postoperative swelling can make this tighter than it was in the operating room. Disposition: Per primary team as medically appropriate Follow-up: Please call [facility] [phone]) to schedule follow-op appointment for 2 weeks after surgery    Larwance Sachs Kiffany Schelling 06/07/2022, 8:29 AM

## 2022-06-08 DIAGNOSIS — N186 End stage renal disease: Secondary | ICD-10-CM | POA: Diagnosis not present

## 2022-06-08 DIAGNOSIS — Z95810 Presence of automatic (implantable) cardiac defibrillator: Secondary | ICD-10-CM | POA: Diagnosis not present

## 2022-06-08 DIAGNOSIS — S82892A Other fracture of left lower leg, initial encounter for closed fracture: Secondary | ICD-10-CM | POA: Diagnosis not present

## 2022-06-08 DIAGNOSIS — I255 Ischemic cardiomyopathy: Secondary | ICD-10-CM | POA: Diagnosis not present

## 2022-06-08 LAB — RENAL FUNCTION PANEL
Albumin: 2.7 g/dL — ABNORMAL LOW (ref 3.5–5.0)
Anion gap: 22 — ABNORMAL HIGH (ref 5–15)
BUN: 90 mg/dL — ABNORMAL HIGH (ref 8–23)
CO2: 25 mmol/L (ref 22–32)
Calcium: 9.4 mg/dL (ref 8.9–10.3)
Chloride: 89 mmol/L — ABNORMAL LOW (ref 98–111)
Creatinine, Ser: 10.58 mg/dL — ABNORMAL HIGH (ref 0.61–1.24)
GFR, Estimated: 4 mL/min — ABNORMAL LOW (ref >60)
Glucose, Bld: 141 mg/dL — ABNORMAL HIGH (ref 70–99)
Phosphorus: 6.3 mg/dL — ABNORMAL HIGH (ref 2.5–4.6)
Potassium: 4.8 mmol/L (ref 3.5–5.1)
Sodium: 136 mmol/L (ref 135–145)

## 2022-06-08 LAB — GLUCOSE, CAPILLARY
Glucose-Capillary: 120 mg/dL — ABNORMAL HIGH (ref 70–99)
Glucose-Capillary: 145 mg/dL — ABNORMAL HIGH (ref 70–99)
Glucose-Capillary: 141 mg/dL — ABNORMAL HIGH (ref 70–99)

## 2022-06-08 LAB — HEPATITIS B SURFACE ANTIBODY, QUANTITATIVE: Hep B S AB Quant (Post): 7.6 m[IU]/mL — ABNORMAL LOW (ref >9.9)

## 2022-06-08 MED ORDER — NON FORMULARY: 140.0000 mg | Freq: Once | Status: DC

## 2022-06-08 MED ORDER — POLYETHYLENE GLYCOL 3350 17 G PO PACK
17.0000 g | PACK | Freq: Two times a day (BID) | ORAL | Status: DC
  Administered 2022-06-08 – 2022-06-09 (×3): 17 g via ORAL
  Filled 2022-06-08 (×3): qty 1

## 2022-06-08 MED ORDER — BISACODYL 10 MG RE SUPP
10.0000 mg | Freq: Once | RECTAL | Status: AC
  Administered 2022-06-08: 10 mg via RECTAL
  Filled 2022-06-08: qty 1

## 2022-06-08 NOTE — Consult Note (Signed)
[facility] Nurse Consult Note: Patient receiving care in [facility] 5M10. 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 Now part of the callus has come off. Pressure Injury POA: Yes/No/NA Measurement: 1 cm x 1 cm x 0.4 cm Wound bed: pink Drainage (amount, consistency, odor) none Periwound: ring of callus around the opening Dressing procedure/placement/frequency: Apply iodine from the swabsticks or swab pads from clean utility to wound on plantar surface of right foot at the great toe.  Allow to dry, cover with a small foam dressing.   Monitor the wound area(s) for worsening of condition such as: Signs/symptoms of infection,  Increase in size,  Development of or worsening of odor, Development of pain, or increased pain at the affected locations.  Notify the medical team if any of these develop.  Thank you for the consult.  Discussed plan of care with the patient and bedside nurse.  [facility] nurse will not follow at this time.  Please re-consult the [facility] team if needed.  Val Riles, RN, MSN, CWOCN, CNS-BC, pager [phone]

## 2022-06-08 NOTE — Progress Notes (Signed)
Physical Therapy Treatment Patient Details Name: Rodney Farley MRN: [id number] DOB: October 02, 1931 Today's Date: 06/08/2022   History of Present Illness 88 y.o. male admitted on 05/28/2022 following a mechanical fall at home, Diagnosed with left ankle fracture.; s/p left bimalleolar ankle fracture with internal fixation of left fibula, with intramedullary implant 11/21; PMH of extensive cardiac history including CAD, NSTEMI, multiple PCI's including most recently on 05/07/2022 when he had PCI with balloon angioplasty for recurrent in-stent stenosis of LAD, ischemic cardiomyopathy, chronic systolic CHF, complete heart block s/p PPM, subclinical atrial fibrillation, moderate aortic stenosis, chronic hypotension, HLD, DM 2, ESRD on HD MWF, OSA, COPD, TIA.    PT Comments    Continuing work on functional mobility and activity tolerance;  Asked by NT to assist in [facility] transfer, and this PT was able to help; [place] was considerably exhausted and quite less talkative than usual, but needing to get to the [facility]; Needing more assist this pm than earlier session, but making a good effort; continue to recommend post-acute rehab at [facility]   Recommendations for follow up therapy are one component of a multi-disciplinary discharge planning process, led by the attending physician.  Recommendations may be updated based on patient status, additional functional criteria and insurance authorization.  Follow Up Recommendations  Skilled nursing-short term rehab (<3 hours/day) Can patient physically be transported by private vehicle: No   Assistance Recommended at Discharge Frequent or constant Supervision/Assistance  Patient can return home with the following Two people to help with walking and/or transfers;A lot of help with bathing/dressing/bathroom;Assistance with cooking/housework;Assist for transportation   Equipment Recommendations  Wheelchair (measurements PT);Wheelchair cushion (measurements PT)     Recommendations for Other Services       Precautions / Restrictions Precautions Precautions: Fall Precaution Comments: If shoe available for Rt foot, don for transfer. Restrictions LLE Weight Bearing: Non weight bearing Other Position/Activity Restrictions: Pt has open callus on bottom of Rt foot.     Mobility  Bed Mobility Overal bed mobility: Needs Assistance Bed Mobility: Supine to Sit, Sit to Supine     Supine to sit: Min guard Sit to supine: Min assist   General bed mobility comments: VC for technique minguard for safety during rise to EOB. using rail for support. Min assist for LLE back into bed. Patient Response: Anxious  Transfers Overall transfer level: Needs assistance Equipment used: Rolling walker (2 wheels) Transfers: Sit to/from Stand, Bed to chair/wheelchair/BSC Sit to Stand: Mod assist, From elevated surface Stand pivot transfers: Mod assist, +2 safety/equipment         General transfer comment: Particularly anxious, and needed heavy mod assist to rise; Heavy mod assist to pivot, and pt needed heavy assist to guide hips to bSC; stood with 2 person assist (second person assisted with hygeine before pivoting to bed)    Ambulation/Gait                   Stairs             Wheelchair Mobility    Modified Rankin (Stroke Patients Only)       Balance     Sitting balance-Leahy Scale: Fair       Standing balance-Leahy Scale: Poor                              Cognition Arousal/Alertness: Awake/alert (Though less talkative and seems sleepy) Behavior During Therapy: Anxious Overall Cognitive Status: Within Functional Limits for tasks  assessed                                 General Comments: anxious and fearful of falling        Exercises      General Comments General comments (skin integrity, edema, etc.): slightly incontnent of bowel during transfer to [facility]      Pertinent Vitals/Pain Pain  Assessment Pain Assessment: Faces Faces Pain Scale: Hurts even more Pain Location: Lt foot Pain Descriptors / Indicators: Aching, Constant, Sore Pain Intervention(s): Monitored during session    Home Living                          Prior Function            PT Goals (current goals can now be found in the care plan section) Acute Rehab PT Goals Patient Stated Goal: independence PT Goal Formulation: With patient Time For Goal Achievement: 06/15/22 Potential to Achieve Goals: Good Progress towards PT goals: Not progressing toward goals - comment (very sleepy this afternoon; perhaps due to meds?)    Frequency    Min 2X/week      PT Plan Current plan remains appropriate    Co-evaluation              AM-PAC PT "6 Clicks" Mobility   Outcome Measure  Help needed turning from your back to your side while in a flat bed without using bedrails?: None Help needed moving from lying on your back to sitting on the side of a flat bed without using bedrails?: A Little Help needed moving to and from a bed to a chair (including a wheelchair)?: Total Help needed standing up from a chair using your arms (e.g., wheelchair or bedside chair)?: Total Help needed to walk in hospital room?: Total Help needed climbing 3-5 steps with a railing? : Total 6 Click Score: 11    End of Session Equipment Utilized During Treatment: Gait belt Activity Tolerance: Patient tolerated treatment well Patient left: in bed;with call bell/phone within reach;with bed alarm set (on bedpan) Nurse Communication: Mobility status (on bedpan) PT Visit Diagnosis: Other abnormalities of gait and mobility (R26.89);Pain Pain - Right/Left: Left Pain - part of body: Leg;Ankle and joints of foot     Time: 1400-1425 (in and out times are approximate) PT Time Calculation (min) (ACUTE ONLY): 25 min  Charges:  $Therapeutic Activity: 23-37 mins                     [name], PT  Acute Rehabilitation  Services Office [place] 06/08/2022, 6:20 PM

## 2022-06-08 NOTE — Progress Notes (Signed)
Physical Therapy Treatment Patient Details Name: Rodney Farley MRN: [id number] DOB: 1932-06-03 Today's Date: 06/08/2022   History of Present Illness 88 y.o. male admitted on 05/28/2022 following a mechanical fall at home, Diagnosed with left ankle fracture.; s/p left bimalleolar ankle fracture with internal fixation of left fibula, with intramedullary implant 11/21; PMH of extensive cardiac history including CAD, NSTEMI, multiple PCI's including most recently on 05/07/2022 when he had PCI with balloon angioplasty for recurrent in-stent stenosis of LAD, ischemic cardiomyopathy, chronic systolic CHF, complete heart block s/p PPM, subclinical atrial fibrillation, moderate aortic stenosis, chronic hypotension, HLD, DM 2, ESRD on HD MWF, OSA, COPD, TIA.    PT Comments    Progressing towards acute functional mobility goals. Mod assist for sit to stand from bed and BSC. Min assist for pivot transfer to and from bsc/bed, doing a good job of maintaining NWB through LLE. Pt had suppository prior to tx, unsuccessful attempt at [facility], returned to bed with LLE elevated. Verb understanding of exercises provided at last session. Patient will continue to benefit from skilled physical therapy services to further improve independence with functional mobility.    Recommendations for follow up therapy are one component of a multi-disciplinary discharge planning process, led by the attending physician.  Recommendations may be updated based on patient status, additional functional criteria and insurance authorization.  Follow Up Recommendations  Skilled nursing-short term rehab (<3 hours/day) Can patient physically be transported by private vehicle: No   Assistance Recommended at Discharge Frequent or constant Supervision/Assistance  Patient can return home with the following Two people to help with walking and/or transfers;A lot of help with bathing/dressing/bathroom;Assistance with  cooking/housework;Assist for transportation   Equipment Recommendations  Wheelchair (measurements PT);Wheelchair cushion (measurements PT)    Recommendations for Other Services OT consult     Precautions / Restrictions Precautions Precautions: Fall Precaution Comments: If shoe available for Rt foot, donne for transfer. Restrictions Weight Bearing Restrictions: Yes LLE Weight Bearing: Non weight bearing Other Position/Activity Restrictions: Pt has open callus on bottom of Rt foot.     Mobility  Bed Mobility Overal bed mobility: Needs Assistance Bed Mobility: Supine to Sit, Sit to Supine     Supine to sit: Supervision, HOB elevated Sit to supine: Min assist   General bed mobility comments: VC for technique supervision to rise to EOB. using rail for support. Min assist for LLE back into bed.    Transfers Overall transfer level: Needs assistance Equipment used: Rolling walker (2 wheels) Transfers: Sit to/from Stand, Bed to chair/wheelchair/BSC Sit to Stand: Mod assist, From elevated surface Stand pivot transfers: Min assist         General transfer comment: Mod assist for boost to stand from bed and BSC, cues to shift weight anteriorly, and hand placement, assist with LLE elevation to maintain NWB. Min assist for pivot with RW to and from [facility] with pt capable of elevating LLE from floor without assist. Taking small hops intermittently. Min assist provided additionally for balance. Took several small lateral hops towards Rt along bed.    Ambulation/Gait                   Stairs             Wheelchair Mobility    Modified Rankin (Stroke Patients Only)       Balance Overall balance assessment: Needs assistance Sitting-balance support: No upper extremity supported, Feet supported Sitting balance-Leahy Scale: Fair     Standing balance support: Bilateral  upper extremity supported, During functional activity Standing balance-Leahy Scale: Poor                               Cognition Arousal/Alertness: Awake/alert Behavior During Therapy: Anxious Overall Cognitive Status: Within Functional Limits for tasks assessed                                          Exercises Other Exercises Other Exercises: Pt demonstrates exercises that he has been performing.    General Comments General comments (skin integrity, edema, etc.): Noted callus bleeding on bottom of Rt foot. Portion of time taken to actively listen to patient concerns and frustrations regarding transfers with other staff. States he attempted to transfer from chair to bed without RW. Discussed need for use of RW during transfers.      Pertinent Vitals/Pain Pain Assessment Pain Assessment: 0-10 Pain Score: 1  Pain Location: Lt foot Pain Descriptors / Indicators: Guarding, Aching Pain Intervention(s): Monitored during session, Repositioned, Limited activity within patient's tolerance    Home Living                          Prior Function            PT Goals (current goals can now be found in the care plan section) Acute Rehab PT Goals Patient Stated Goal: independence PT Goal Formulation: With patient Time For Goal Achievement: 06/15/22 Potential to Achieve Goals: Good Progress towards PT goals: Progressing toward goals    Frequency    Min 2X/week      PT Plan Current plan remains appropriate    Co-evaluation              AM-PAC PT "6 Clicks" Mobility   Outcome Measure  Help needed turning from your back to your side while in a flat bed without using bedrails?: None Help needed moving from lying on your back to sitting on the side of a flat bed without using bedrails?: A Little Help needed moving to and from a bed to a chair (including a wheelchair)?: A Lot Help needed standing up from a chair using your arms (e.g., wheelchair or bedside chair)?: A Lot Help needed to walk in hospital room?: Total Help needed  climbing 3-5 steps with a railing? : Total 6 Click Score: 13    End of Session Equipment Utilized During Treatment: Gait belt Activity Tolerance: Patient tolerated treatment well Patient left: in bed;with call bell/phone within reach;with bed alarm set;with nursing/sitter in room Nurse Communication: Mobility status (Rt plantar surface of foot, open callus) PT Visit Diagnosis: Other abnormalities of gait and mobility (R26.89);Pain Pain - Right/Left: Left Pain - part of body: Leg;Ankle and joints of foot     Time: 0017-4944 PT Time Calculation (min) (ACUTE ONLY): 44 min  Charges:  $Therapeutic Activity: 23-37 mins                     Candie Mile, PT, DPT Physical Therapist Acute Rehabilitation Services [place] 06/08/2022, 10:02 AM

## 2022-06-08 NOTE — Progress Notes (Signed)
[facility] Progress Note   Subjective:   Patient seen and examined at bedside.  Feeling poorly today.  Tired and a little nauseated.  Refused dialysis.  Encouraged him to go.  States he "does not feel up to going to dialysis today".  Discussed he would require back to back treatments to get back on schedule if he missed today - patient agreed. Denies CP, SOB, abdominal pain, vomiting and diarrhea.  Admits to pain in R ankle/foot.   Objective Vitals:   06/07/22 1738 06/07/22 2101 06/08/22 0552 06/08/22 0927  BP: (!) 105/46 119/69 (!) 114/54 (!) 90/44  Pulse: 79 80 69 79  Resp: 18 18 18 20   Temp:  98 F (36.7 C) 98.6 F (37 C) 98 F (36.7 C)  TempSrc:  Oral Oral Oral  SpO2: 100% 100% 98% 97%  Weight:      Height:       Physical Exam General:chronically ill appearing, alert male in NAD Heart:RRR, no mrg Lungs:CTAB, nml WOB on RA Abdomen:soft, NTND Extremities:no LE edema Dialysis Access: RUE AVF +b/t   Filed Weights   06/02/22 0801 06/02/22 1221 06/05/22 0841  Weight: 92 kg 90.1 kg 89.7 kg    Intake/Output Summary (Last 24 hours) at 06/08/2022 1316 Last data filed at 06/08/2022 0900 Gross per 24 hour  Intake 700 ml  Output --  Net 700 ml    Additional Objective Labs: Basic Metabolic Panel: Recent Labs  Lab 06/06/22 0058 06/07/22 0556 06/08/22 0134  NA 137 137 136  K 4.5 4.4 4.8  CL 93* 92* 89*  CO2 28 27 25   GLUCOSE 92 131* 141*  BUN 43* 73* 90*  CREATININE 6.87* 9.52* 10.58*  CALCIUM 9.6 9.4 9.4  PHOS 5.2* 6.1* 6.3*   Liver Function Tests: Recent Labs  Lab 06/06/22 0058 06/07/22 0556 06/08/22 0134  ALBUMIN 2.7* 2.7* 2.7*   CBC: Recent Labs  Lab 06/02/22 1600 06/05/22 0318  WBC  --  8.1  HGB 11.9* 9.3*  HCT 35.0* 28.7*  MCV  --  101.8*  PLT  --  312   CBG: Recent Labs  Lab 06/02/22 1802 06/07/22 2135 06/08/22 0741 06/08/22 1118  GLUCAP 115* 174* 120* 145*    Medications:   (feeding supplement) PROSource Plus  30  mL Oral BID BM   acidophilus  1 capsule Oral Daily   aspirin EC  81 mg Oral BID   atorvastatin  80 mg Oral QHS   calcitRIOL  0.5 mcg Oral Q M,W,F-HD   carvedilol  3.125 mg Oral 2 times per day on Sun Tue Thu Sat   carvedilol  3.125 mg Oral Once per day on Mon Wed Fri   Chlorhexidine Gluconate Cloth  6 each Topical Q0600   Chlorhexidine Gluconate Cloth  6 each Topical Q0600   cyanocobalamin  1,000 mcg Oral Daily   darbepoetin (ARANESP) injection - DIALYSIS  100 mcg Subcutaneous Q Tue-1800   docusate sodium  100 mg Oral BID   ezetimibe  10 mg Oral Daily   gabapentin  100 mg Oral QHS   heparin  5,000 Units Subcutaneous Q8H   isosorbide mononitrate  15 mg Oral Daily   melatonin  3 mg Oral QHS   midodrine  10 mg Oral Q M,W,F   midodrine  10 mg Oral Once in dialysis   multivitamin  1 tablet Oral Daily   NON FORMULARY 140 mg  140 mg Subcutaneous Once   pantoprazole  40 mg Oral Daily   polyethylene  glycol  17 g Oral BID   senna  1 tablet Oral QHS   sevelamer carbonate  2,400 mg Oral BID WC   ticagrelor  90 mg Oral BID    Dialysis Orders: MWF [facility]  4h   400/500  94kg  2/2 bath  Hep 2500+ 1072midrun  RUA AVG - last HD 11/15, post 95.4kg - calcitriol 1.75 ug po tiw - mircera 52mcg q4, last 11/6   Assessment/Plan: L ankle fracture: S/p ORIF 11/21 - pain control per primary. ESRD: On MWF.  Refused HD today. K 4.8. Plan for HD tomorrow 1st shift off schedule, then resume regular schedule.  Hypertension/volume: On midodrine pre-HD (as well as BB due to CAD). He does not appear overloaded at this time.  UF as tolerated.  HFrEF - EF 45-50% CAD/multiple PCI/stent: recurrent unstable angina, followed by cardiology.  Anemia of ESRD: Hgb 9.3- Aranesp 172mcg started q Tuesday Secondary HPTH: Phos mildly elevated, resumed binders. Corrected calcium elevated, decreased VDRA further to 0.46mcg to start today.  DM2 - on insulin, per pmd Nutrition - Currently on regular diet.  Alb low - protein  supplements.  If K increases will need to change back to renal diet.  Dispo - Plan for to SNF, waiting for authorization  Jen Mow, PA-C [facility] 06/08/2022,1:16 PM  LOS: 10 days

## 2022-06-08 NOTE — Progress Notes (Signed)
PROGRESS NOTE    Rodney Farley  KGM:[id number] DOB: Aug 04, 1931 DOA: 05/28/2022 PCP: [name], MD    Brief Narrative:  88 year old from home with extensive cardiac history including coronary artery disease, non-STEMI and multiple PCI's with recent PCI on 05/07/2022, recurrent in-stent restenosis of LAD, ischemic cardiomyopathy, chronic combined heart failure, complete heart block status post ICD CRT, ESRD on hemodialysis and obstructive sleep apnea presented to the emergency room on 11/17 following a mechanical fall at home the night prior and severe pain in his left ankle.  Patient was found to have left ankle fracture and admitted to the hospital.  After medically optimizing, patient underwent ORIF left ankle 11/21. Medically stabilizing.  Waiting for SNF bed.   Assessment & Plan:   Closed traumatic fracture and dislocation of the left ankle: ORIF 11/20 Dr. Mable Fill Nonweightbearing left lower extremity DVT prophylaxis with SCDs, aspirin 81 mg twice daily for 2 weeks. Adequate IV and oral opiates for pain relief, mobilize with PT OT.   Ortho to schedule postop follow-up. Stable to transfer to SNF.  ESRD on hemodialysis Chronic hypotension on midodrine with dialysis Chronic combined heart failure and ischemic cardiomyopathy status post AICD Coronary artery disease and multiple PCI's with recent PCI:  Chronic and stable.  Receiving dialysis on his schedule and followed by nephrology. Blood pressures are stable. Euvolemic and fluid balance maintained with hemodialysis. Recent LAD in-stent restenosis status post PCI on aspirin, Brilinta and a statin.  Patient is on atorvastatin and Zetia.  Repatha as outpatient.  Untreated sleep apnea.  Untreated.  He does not use CPAP.  Medically stabilized.  Transfer to SNF when bed available.    DVT prophylaxis: SCDs Start: 06/02/22 1842 heparin injection 5,000 Units Start: 05/29/22 0915   Code Status: Full code. Family  Communication: None. Disposition Plan: Status is: Inpatient Remains inpatient appropriate because: Waiting for skilled bed.     Consultants:  Nephrology Orthopedics Cardiology  Procedures:  ORIF left ankle 11/21  Antimicrobials:  Perioperative   Subjective: Seen and examined.  No overnight events.  Pain is controlled.  Trying to have bowel movement with MiraLAX and Dulcolax suppository. Patient wants to take Repatha injection and his friend will bring it from home.  Objective: Vitals:   06/07/22 1738 06/07/22 2101 06/08/22 0552 06/08/22 0927  BP: (!) 105/46 119/69 (!) 114/54 (!) 90/44  Pulse: 79 80 69 79  Resp: 18 18 18 20   Temp:  98 F (36.7 C) 98.6 F (37 C) 98 F (36.7 C)  TempSrc:  Oral Oral Oral  SpO2: 100% 100% 98% 97%  Weight:      Height:        Intake/Output Summary (Last 24 hours) at 06/08/2022 1232 Last data filed at 06/08/2022 0900 Gross per 24 hour  Intake 700 ml  Output --  Net 700 ml    Filed Weights   06/02/22 0801 06/02/22 1221 06/05/22 0841  Weight: 92 kg 90.1 kg 89.7 kg    Examination:  Comfortable today.  On room air. AV fistula right upper extremity. Left leg in cast, distal neurovascular status intact.   Data Reviewed: I have personally reviewed following labs and imaging studies  CBC: Recent Labs  Lab 06/02/22 1600 06/05/22 0318  WBC  --  8.1  HGB 11.9* 9.3*  HCT 35.0* 28.7*  MCV  --  101.8*  PLT  --  644    Basic Metabolic Panel: Recent Labs  Lab 06/04/22 0250 06/05/22 0318 06/06/22 0058 06/07/22 0556 06/08/22  0134  NA 135 140 137 137 136  K 4.0 4.6 4.5 4.4 4.8  CL 89* 92* 93* 92* 89*  CO2 26 26 28 27 25   GLUCOSE 120* 127* 92 131* 141*  BUN 46* 68* 43* 73* 90*  CREATININE 7.70* 9.63* 6.87* 9.52* 10.58*  CALCIUM 9.4 9.7 9.6 9.4 9.4  PHOS 5.4* 6.1* 5.2* 6.1* 6.3*    GFR: Estimated Creatinine Clearance: 5.2 mL/min (A) (by C-G formula based on SCr of 10.58 mg/dL (H)). Liver Function Tests: Recent Labs   Lab 06/04/22 0250 06/05/22 0318 06/06/22 0058 06/07/22 0556 06/08/22 0134  ALBUMIN 2.7* 2.6* 2.7* 2.7* 2.7*    No results for input(s): "LIPASE", "AMYLASE" in the last 168 hours. No results for input(s): "AMMONIA" in the last 168 hours. Coagulation Profile: No results for input(s): "INR", "PROTIME" in the last 168 hours. Cardiac Enzymes: No results for input(s): "CKTOTAL", "CKMB", "CKMBINDEX", "TROPONINI" in the last 168 hours. BNP (last 3 results) No results for input(s): "PROBNP" in the last 8760 hours. HbA1C: No results for input(s): "HGBA1C" in the last 72 hours. CBG: Recent Labs  Lab 06/02/22 1802 06/07/22 2135 06/08/22 0741 06/08/22 1118  GLUCAP 115* 174* 120* 145*    Lipid Profile: No results for input(s): "CHOL", "HDL", "LDLCALC", "TRIG", "CHOLHDL", "LDLDIRECT" in the last 72 hours. Thyroid Function Tests: No results for input(s): "TSH", "T4TOTAL", "FREET4", "T3FREE", "THYROIDAB" in the last 72 hours. Anemia Panel: No results for input(s): "VITAMINB12", "FOLATE", "FERRITIN", "TIBC", "IRON", "RETICCTPCT" in the last 72 hours. Sepsis Labs: No results for input(s): "PROCALCITON", "LATICACIDVEN" in the last 168 hours.  No results found for this or any previous visit (from the past 240 hour(s)).       Radiology Studies: No results found.      Scheduled Meds:  (feeding supplement) PROSource Plus  30 mL Oral BID BM   acidophilus  1 capsule Oral Daily   aspirin EC  81 mg Oral BID   atorvastatin  80 mg Oral QHS   calcitRIOL  0.5 mcg Oral Q M,W,F-HD   carvedilol  3.125 mg Oral 2 times per day on Sun Tue Thu Sat   carvedilol  3.125 mg Oral Once per day on Mon Wed Fri   Chlorhexidine Gluconate Cloth  6 each Topical Q0600   Chlorhexidine Gluconate Cloth  6 each Topical Q0600   cyanocobalamin  1,000 mcg Oral Daily   darbepoetin (ARANESP) injection - DIALYSIS  100 mcg Subcutaneous Q Tue-1800   docusate sodium  100 mg Oral BID   ezetimibe  10 mg Oral Daily    gabapentin  100 mg Oral QHS   heparin  5,000 Units Subcutaneous Q8H   isosorbide mononitrate  15 mg Oral Daily   melatonin  3 mg Oral QHS   midodrine  10 mg Oral Q M,W,F   midodrine  10 mg Oral Once in dialysis   multivitamin  1 tablet Oral Daily   NON FORMULARY 140 mg  140 mg Subcutaneous Once   pantoprazole  40 mg Oral Daily   polyethylene glycol  17 g Oral BID   senna  1 tablet Oral QHS   sevelamer carbonate  2,400 mg Oral BID WC   ticagrelor  90 mg Oral BID   Continuous Infusions:     LOS: 10 days    Time spent: 25 minutes    Barb Merino, MD [facility] Pager [phone]

## 2022-06-08 NOTE — TOC Progression Note (Addendum)
Transition of Care [facility]) - Progression Note    Patient Details  Name: Ranald Alessio MRN: [id number] Date of Birth: Mar 12, 1932  Transition of Care [facility]) CM/SW [place], LCSW Phone Number: 06/08/2022, 9:59 AM  Clinical Narrative:    9:59am-Reached out to [facility]; awaiting response. Expanded bed search.   1pm-[facility] able to accept patient. CSW starting authorization with [employer].      Barriers to Discharge: Continued Medical Work up  Expected Discharge Plan and Services     Discharge Planning Services: CM Consult Post Acute Care Choice: Resumption of Svcs/PTA Provider ([place]) Living arrangements for the past 2 months: [place]: PT, OT HH Agency: [place] Date [place]: 06/01/22 Time [place]: 1520 Representative spoke with at [place]: [place] ([place]) Interventions    Readmission Risk Interventions    05/08/2022    4:03 PM 04/02/2022    3:35 PM  Readmission Risk Prevention Plan  Transportation Screening Complete Complete  Medication Review Press photographer) Complete Complete  PCP or Specialist appointment within 3-5 days of discharge  Complete  HRI or [place] Complete Complete  SW Recovery Care/Counseling Consult Complete Complete  Palliative Care Screening Not Applicable Not [place] Not Applicable Not Applicable

## 2022-06-08 NOTE — Progress Notes (Signed)
Occupational Therapy Treatment Patient Details Name: Rodney Farley MRN: [id number] DOB: 1932/05/29 Today's Date: 06/08/2022   History of present illness 88 y.o. male admitted on 05/28/2022 following a mechanical fall at home, Diagnosed with left ankle fracture.; s/p left bimalleolar ankle fracture with internal fixation of left fibula, with intramedullary implant 11/21; PMH of extensive cardiac history including CAD, NSTEMI, multiple PCI's including most recently on 05/07/2022 when he had PCI with balloon angioplasty for recurrent in-stent stenosis of LAD, ischemic cardiomyopathy, chronic systolic CHF, complete heart block s/p PPM, subclinical atrial fibrillation, moderate aortic stenosis, chronic hypotension, HLD, DM 2, ESRD on HD MWF, OSA, COPD, TIA.   OT comments  Pt in bed upon therapy arrival and agreeable to participate in OT tx session. Initial plan was to provide HEP for UE strengthening although pt states that he received a suppository recently and felt like he needed to use the commode. Pt attempted to have a BM without results. Half way into stand pvt transfer back to bed, pt stated that he felt the need to go again. Pt transitioned back to [facility]. Requested to stay for a while. Pt became nauseous while seated on BSC and blue bag was provided. Nursing was informed of pt's request for pain meds and location on BSC. OT will continue to follow patient acutely.    Recommendations for follow up therapy are one component of a multi-disciplinary discharge planning process, led by the attending physician.  Recommendations may be updated based on patient status, additional functional criteria and insurance authorization.    Follow Up Recommendations  Skilled nursing-short term rehab (<3 hours/day)     Assistance Recommended at Discharge Frequent or constant Supervision/Assistance  Patient can return home with the following  Assistance with cooking/housework;Assist for transportation;Help  with stairs or ramp for entrance;A lot of help with bathing/dressing/bathroom;A lot of help with walking and/or transfers   Equipment Recommendations  Other (comment) (TBD)       Precautions / Restrictions Precautions Precautions: Fall Precaution Comments: If shoe available for Rt foot, don for transfer. Restrictions Weight Bearing Restrictions: Yes LLE Weight Bearing: Non weight bearing Other Position/Activity Restrictions: Pt has open callus on bottom of Rt foot.       Mobility Bed Mobility Overal bed mobility: Needs Assistance Bed Mobility: Supine to Sit     Supine to sit: Supervision, HOB elevated       Patient Response: Anxious  Transfers Overall transfer level: Needs assistance Equipment used: Rolling walker (2 wheels) Transfers: Sit to/from Stand, Bed to chair/wheelchair/BSC Sit to Stand: Mod assist, From elevated surface Stand pivot transfers: Min guard, From elevated surface         General transfer comment: VC provided for hand placement and technique prior to sit to stand. patient required Mod A for initial sit to stand. Was able to maintain WB restrictions on RLE during transfer.     Balance Overall balance assessment: Needs assistance Sitting-balance support: No upper extremity supported, Feet supported Sitting balance-Leahy Scale: Fair     Standing balance support: Bilateral upper extremity supported, During functional activity, Reliant on assistive device for balance Standing balance-Leahy Scale: Poor           ADL either performed or assessed with clinical judgement   ADL Overall ADL's : Needs assistance/impaired       Lower Body Dressing: Total assistance;Sit to/from stand   Toilet Transfer: Minimal assistance;Cueing for safety;Cueing for sequencing;Stand-pivot;BSC/3in1;Rolling walker (2 wheels)     Toileting - Clothing Manipulation Details (indicate cue  type and reason): N/A              Cognition Arousal/Alertness:  Awake/alert Behavior During Therapy: Anxious Overall Cognitive Status: Within Functional Limits for tasks assessed                General Comments Noted callus bleeding on bottom of Rt foot. Portion of time taken to actively listen to patient concerns and frustrations regarding transfers with other staff. States he attempted to transfer from chair to bed without RW. Discussed need for use of RW during transfers.    Pertinent Vitals/ Pain       Pain Assessment Pain Assessment: 0-10 Pain Score: 3  Pain Location: Lt foot Pain Descriptors / Indicators: Aching, Constant, Sore Pain Intervention(s): Monitored during session, Patient requesting pain meds-RN notified, Limited activity within patient's tolerance         Frequency  Min 2X/week        Progress Toward Goals  OT Goals(current goals can now be found in the care plan section)  Progress towards OT goals: Progressing toward goals     Plan Frequency remains appropriate;Discharge plan remains appropriate       AM-PAC OT "6 Clicks" Daily Activity     Outcome Measure   Help from another person eating meals?: None Help from another person taking care of personal grooming?: A Little Help from another person toileting, which includes using toliet, bedpan, or urinal?: A Lot Help from another person bathing (including washing, rinsing, drying)?: A Lot Help from another person to put on and taking off regular upper body clothing?: A Little Help from another person to put on and taking off regular lower body clothing?: Total 6 Click Score: 15    End of Session Equipment Utilized During Treatment: Gait belt;Rolling walker (2 wheels)  OT Visit Diagnosis: Unsteadiness on feet (R26.81);Muscle weakness (generalized) (M62.81);History of falling (Z91.81);Pain Pain - Right/Left: Left Pain - part of body: Ankle and joints of foot   Activity Tolerance Patient tolerated treatment well   Patient Left with call bell/phone within  reach;Other (comment) (on [facility])   Nurse Communication Mobility status;Patient requests pain meds;Other (comment) (that pt was sitting on [facility] and would call when finished)        Time: 3710-6269 OT Time Calculation (min): 38 min  Charges: OT General Charges $OT Visit: 1 Visit OT Treatments $Self Care/Home Management : 38-52 mins  Ailene Ravel, OTR/L,CBIS  Supplemental OT - [facility] and [facility]   Parisa Pinela, Clarene Duke 06/08/2022, 12:40 PM

## 2022-06-09 DIAGNOSIS — Z95810 Presence of automatic (implantable) cardiac defibrillator: Secondary | ICD-10-CM | POA: Diagnosis not present

## 2022-06-09 DIAGNOSIS — N186 End stage renal disease: Secondary | ICD-10-CM | POA: Diagnosis not present

## 2022-06-09 DIAGNOSIS — I5022 Chronic systolic (congestive) heart failure: Secondary | ICD-10-CM | POA: Diagnosis not present

## 2022-06-09 DIAGNOSIS — I255 Ischemic cardiomyopathy: Secondary | ICD-10-CM | POA: Diagnosis not present

## 2022-06-09 DIAGNOSIS — S82892A Other fracture of left lower leg, initial encounter for closed fracture: Secondary | ICD-10-CM | POA: Diagnosis not present

## 2022-06-09 LAB — RENAL FUNCTION PANEL
Albumin: 2.7 g/dL — ABNORMAL LOW (ref 3.5–5.0)
Anion gap: 21 — ABNORMAL HIGH (ref 5–15)
BUN: 116 mg/dL — ABNORMAL HIGH (ref 8–23)
CO2: 23 mmol/L (ref 22–32)
Calcium: 9.7 mg/dL (ref 8.9–10.3)
Chloride: 91 mmol/L — ABNORMAL LOW (ref 98–111)
Creatinine, Ser: 12.4 mg/dL — ABNORMAL HIGH (ref 0.61–1.24)
GFR, Estimated: 4 mL/min — ABNORMAL LOW (ref >60)
Glucose, Bld: 128 mg/dL — ABNORMAL HIGH (ref 70–99)
Phosphorus: 6.5 mg/dL — ABNORMAL HIGH (ref 2.5–4.6)
Potassium: 5.4 mmol/L — ABNORMAL HIGH (ref 3.5–5.1)
Sodium: 135 mmol/L (ref 135–145)

## 2022-06-09 LAB — CBC
HCT: 28.5 % — ABNORMAL LOW (ref 39.0–52.0)
Hemoglobin: 9.4 g/dL — ABNORMAL LOW (ref 13.0–17.0)
MCH: 33.3 pg (ref 26.0–34.0)
MCHC: 33 g/dL (ref 30.0–36.0)
MCV: 101.1 fL — ABNORMAL HIGH (ref 80.0–100.0)
Platelets: 404 10*3/uL — ABNORMAL HIGH (ref 150–400)
RBC: 2.82 MIL/uL — ABNORMAL LOW (ref 4.22–5.81)
RDW: 14.7 % (ref 11.5–15.5)
WBC: 16.1 10*3/uL — ABNORMAL HIGH (ref 4.0–10.5)
nRBC: 0 % (ref 0.0–0.2)

## 2022-06-09 LAB — GLUCOSE, CAPILLARY
Glucose-Capillary: 90 mg/dL (ref 70–99)
Glucose-Capillary: 92 mg/dL (ref 70–99)

## 2022-06-09 MED ORDER — GABAPENTIN 100 MG PO CAPS: 100.0000 mg | ORAL_CAPSULE | Freq: Every day | ORAL | Status: AC

## 2022-06-09 MED ORDER — ALBUMIN HUMAN 25 % IV SOLN
25.0000 g | Freq: Once | INTRAVENOUS | Status: AC
  Administered 2022-06-09: 25 g via INTRAVENOUS

## 2022-06-09 MED ORDER — DOCUSATE SODIUM 100 MG PO CAPS: 100.0000 mg | ORAL_CAPSULE | Freq: Two times a day (BID) | ORAL | 0 refills | Status: AC

## 2022-06-09 MED ORDER — CHLORHEXIDINE GLUCONATE CLOTH 2 % EX PADS: 6.0000 | MEDICATED_PAD | Freq: Every day | CUTANEOUS | Status: DC

## 2022-06-09 MED ORDER — MELATONIN 3 MG PO TABS: 3.0000 mg | ORAL_TABLET | Freq: Every day | ORAL | 0 refills | Status: AC

## 2022-06-09 MED ORDER — ALBUMIN HUMAN 25 % IV SOLN
INTRAVENOUS | Status: AC
  Filled 2022-06-09: qty 100

## 2022-06-09 NOTE — Progress Notes (Signed)
DISCHARGE NOTE SNF [facility] to be discharged Skilled nursing facility per MD order. Patient verbalized understanding.  Skin clean, dry and intact without evidence of skin break down, no evidence of skin tears noted. IV catheter discontinued intact. Site without signs and symptoms of complications. Dressing and pressure applied. Pt denies pain at the site currently. No complaints noted.  Patient free of lines, drains, and wounds.   Discharge packet assembled. An After Visit Summary (AVS) was printed and given to the EMS personnel. Patient escorted via stretcher and discharged to [facility] via ambulance. Report called to accepting facility; all questions and concerns addressed.   Arlyss Repress, RN

## 2022-06-09 NOTE — TOC Progression Note (Addendum)
Transition of Care [facility]) - Progression Note    Patient Details  Name: Rodney Farley MRN: [id number] Date of Birth: 1932/01/05  Transition of Care [facility]) CM/SW [place], LCSW Phone Number: 06/09/2022, 10:05 AM  Clinical Narrative:    10am-CSW spoke with patient's daughter in law and she will go to [facility] to do paperwork at 1pm today. Insurance approval pending still.   2:06pm-CSW received insurance approval from [place], [place] # [id number], [place] auth# [id number].    Barriers to Discharge: Continued Medical Work up  Expected Discharge Plan and Services     Discharge Planning Services: CM Consult Post Acute Care Choice: Resumption of Svcs/PTA Provider ([place]) Living arrangements for the past 2 months: [place]: PT, OT HH Agency: [place] Date [place]: 06/01/22 Time [place]: 1520 Representative spoke with at [place]: [place] ([place]) Interventions    Readmission Risk Interventions    05/08/2022    4:03 PM 04/02/2022    3:35 PM  Readmission Risk Prevention Plan  Transportation Screening Complete Complete  Medication Review Press photographer) Complete Complete  PCP or Specialist appointment within 3-5 days of discharge  Complete  HRI or [place] [address] Complete Complete  SW Recovery Care/Counseling Consult Complete Complete  Palliative Care Screening Not Applicable Not [place] Not Applicable Not Applicable

## 2022-06-09 NOTE — Discharge Summary (Signed)
Physician Discharge Summary  Rodney Farley CWU:[id number] DOB: 1932/04/24 DOA: 05/28/2022  PCP: [name], MD  Admit date: 05/28/2022 Discharge date: 06/09/2022  Admitted From: Home Disposition: Skilled nursing facility  Recommendations for Outpatient Follow-up:  Follow up with PCP in 1-2 weeks after discharge Orthopedics will schedule follow-up  Home Health: N/A Equipment/Devices: N/A  Discharge Condition: Stable CODE STATUS: DNR Diet recommendation: Low-salt diet  Discharge summary: 88 year old from home with extensive cardiac history including coronary artery disease, non-STEMI and multiple PCI's with recent PCI on 05/07/2022, recurrent in-stent restenosis of LAD, ischemic cardiomyopathy, chronic combined heart failure, complete heart block status post ICD CRT, ESRD on hemodialysis and obstructive sleep apnea presented to the emergency room on 11/17 following a mechanical fall at home the night prior and severe pain in his left ankle.  Patient was found to have left ankle fracture and admitted to the hospital.  After medically optimizing, patient underwent ORIF left ankle 11/21. Patient remained in the hospital waiting for skilled nursing bed for rehab.     Assessment & plan of care:   Closed traumatic fracture and dislocation of the left ankle: ORIF 11/20 Dr. Mable Fill Nonweightbearing left lower extremity DVT prophylaxis with aspirin and Brilinta. Pain managed with oral pain medications.  Continue bowel regimen. Ortho to schedule postop follow-up. Stable to transfer to SNF.   ESRD on hemodialysis Chronic hypotension on midodrine with dialysis Chronic combined heart failure and ischemic cardiomyopathy status post AICD Coronary artery disease and multiple PCI's with recent PCI:   Chronic and stable.  Receiving dialysis on his schedule and followed by nephrology. Blood pressures are stable. Euvolemic and fluid balance maintained with hemodialysis. Recent LAD  in-stent restenosis status post PCI on aspirin, Brilinta and a statin.  Patient is on atorvastatin and Zetia.  Repatha as outpatient.   Untreated sleep apnea.  Untreated.  He does not use CPAP.   Medically stabilized.  Transfer to SNF when bed available.   Discharge Diagnoses:  Principal Problem:   Closed left ankle fracture Active Problems:   Accidental fall   Hypotension   ESRD on hemodialysis (HCC)   Ischemic cardiomyopathy   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   Chronic systolic CHF (congestive heart failure) (HCC)   Coronary artery disease of bypass graft of native heart with stable angina pectoris (HCC)   Anemia in chronic kidney disease   Atrioventricular block, complete (HCC)   Pacemaker   Type II diabetes mellitus ([place])   Hyperlipidemia   Obesity (BMI 30-39.9)   Sleep apnea   Thrombocytopenia (HCC)   Essential hypertension   ESRD on dialysis ([place])   S/P coronary artery stent placement    Discharge Instructions  Discharge Instructions     Diet - low sodium heart healthy   Complete by: As directed    Discharge wound care:   Complete by: As directed    Apply iodine from the swabsticks or swab pads from clean utility to wound on plantar surface of right foot at the great toe.  Allow to dry, cover with a small foam dressing  - barrier dressing to the wounds on the back   Increase activity slowly   Complete by: As directed       Allergies as of 06/09/2022       Reactions   Bee Venom Anaphylaxis   Lyrica [pregabalin] Other (See Comments)   hallucinations   Prednisone Other (See Comments)   hallucinations   Zocor [simvastatin] Nausea Only, Other (See Comments)  Headache with brand name only.  Can take the generic.        Medication List     STOP taking these medications    MIRCERA IJ       TAKE these medications    acetaminophen 500 MG tablet Commonly known as: TYLENOL Take 500 mg by mouth as needed for moderate  pain.   aspirin EC 81 MG tablet Take 81 mg by mouth daily.   atorvastatin 80 MG tablet Commonly known as: LIPITOR Take 1 tablet by mouth daily. What changed: when to take this   Brilinta 90 MG Tabs tablet Generic drug: ticagrelor Take 1 tablet (90 mg total) by mouth 2 (two) times daily.   calcitRIOL 0.25 MCG capsule Commonly known as: ROCALTROL Take 7 capsules (1.75 mcg total) by mouth every Monday, Wednesday, and Friday with hemodialysis. What changed: how much to take   carbamide peroxide 6.5 % OTIC solution Commonly known as: DEBROX Place 5 drops into both ears 2 (two) times daily as needed (earwax). What changed: when to take this   carvedilol 3.125 MG tablet Commonly known as: COREG Take 1 tablet (3.125 mg total) by mouth 2 (two) times daily with a meal. Hold for SBP <100 mmhg OR hr < 60/min What changed:  when to take this additional instructions   cyanocobalamin 1000 MCG tablet Commonly known as: VITAMIN B12 Take 1,000 mcg by mouth daily.   docusate sodium 100 MG capsule Commonly known as: COLACE Take 1 capsule (100 mg total) by mouth 2 (two) times daily.   ezetimibe 10 MG tablet Commonly known as: ZETIA Take 1 tablet (10 mg total) by mouth daily.   FREESTYLE TEST STRIPS test strip Generic drug: glucose blood Use to check blood sugar daily   gabapentin 100 MG capsule Commonly known as: NEURONTIN Take 1 capsule (100 mg total) by mouth at bedtime. What changed:  how much to take how to take this when to take this additional instructions   glucose monitoring kit monitoring kit USE TO MONITOR BLOOD GLUCOSE AS DIRECTED   HYDROcodone-acetaminophen 5-325 MG tablet Commonly known as: NORCO/VICODIN Take 1 tablet by mouth every 6 (six) hours as needed for severe pain (postoperative pain).   isosorbide mononitrate 30 MG 24 hr tablet Commonly known as: IMDUR Take 1/2 tablet (15 mg total) by mouth daily.   LUBRICATING EYE DROPS OP Place 2-3 drops into  both eyes in the morning, at noon, and at bedtime.   melatonin 3 MG Tabs tablet Take 1 tablet (3 mg total) by mouth at bedtime.   midodrine 10 MG tablet Commonly known as: PROAMATINE Take 0.5-1 tablets (5-10 mg total) by mouth See admin instructions. Take 1 tablet (10 mg) three times a week. Add 5 mg a night after dialysis as needed for low blood pressure. What changed: additional instructions   Nephro Vitamins 0.8 MG Tabs Take 1 tablet by mouth daily.   multivitamin Tabs tablet Take 1 tablet by mouth daily.   nitroGLYCERIN 0.4 MG SL tablet Commonly known as: NITROSTAT Place 1 tablet under the tongue every 5 minutes x 3 doses as needed for chest pain.   oxymetazoline 0.05 % nasal spray Commonly known as: AFRIN Place 1 spray into both nostrils 2 (two) times daily as needed for congestion. What changed:  how much to take when to take this reasons to take this   pantoprazole 40 MG tablet Commonly known as: PROTONIX Take 1 tablet by mouth daily.   Repatha SureClick 814 MG/ML So[place]  Generic drug: Evolocumab Inject 140 mg into the skin every 14 (fourteen) days.   saccharomyces boulardii 250 MG capsule Commonly known as: FLORASTOR Take 1 capsule (250 mg total) by mouth 2 (two) times daily. What changed: when to take this   sevelamer carbonate 800 MG tablet Commonly known as: RENVELA Take 2 tablets (1,600 mg total) by mouth 3 (three) times daily with meals. What changed:  how much to take when to take this additional instructions               Discharge Care Instructions  (From admission, onward)           Start     Ordered   06/09/22 0000  Discharge wound care:       Comments: Apply iodine from the swabsticks or swab pads from clean utility to wound on plantar surface of right foot at the great toe.  Allow to dry, cover with a small foam dressing  - barrier dressing to the wounds on the back   06/09/22 1436            Contact information for  follow-up providers     [facility] [place] Follow up.   Specialty: Home Health Services Why: Someone will call you to schedule resumption of care services. Contact information: [address] [address] [phone]         Georgeanna Harrison, MD Follow up in 2 week(s).   Specialty: Orthopedic Surgery Contact information: [address] [place] [address] [phone]              Contact information for after-discharge care     Destination     [facility] Preferred SNF .   Service: Skilled Nursing Contact information: [place] [phone]                    Allergies  Allergen Reactions   Bee Venom Anaphylaxis   Lyrica [Pregabalin] Other (See Comments)    hallucinations   Prednisone Other (See Comments)    hallucinations   Zocor [Simvastatin] Nausea Only and Other (See Comments)    Headache with brand name only.  Can take the generic.    Consultations: Orthopedics Nephrology   Procedures/Studies: DG Ankle Complete Left  Result Date: 06/02/2022 CLINICAL DATA:  [id number] Postop check [id number] EXAM: LEFT ANKLE COMPLETE - 3+ VIEW COMPARISON:  Intraoperative radiographs left foot 06/02/2022, x-ray left ankle 05/29/2022 FINDINGS: Cast overlies foot. Intramedullary nail fixation of the mid to distal fibula. Metallic density along the medial malleolus likely related to ligament repair. There is no evidence of new fracture, dislocation, or joint effusion. There is no evidence of arthropathy or other focal bone abnormality. Posterior calcaneal spur. Soft tissues are unremarkable. IMPRESSION: Intramedullary nail fixation of the mid to distal fibula. Metallic density along the medial malleolus likely related to ligament repair. Electronically Signed   By: Iven Finn M.D.   On: 06/02/2022 20:04   DG Ankle 2 Views Left  Result Date: 06/02/2022 CLINICAL DATA:  ORIF  left lateral malleolar fracture EXAM: LEFT ANKLE - 2 VIEW COMPARISON:  Left ankle x-ray 05/29/2022 FINDINGS: Intraoperative left ankle. Three low resolution intraoperative spot views of the left ankle were obtained. There is a new distal fibular intramedullary nail and screws. There is also new fixation devices crossing the distal tibia and fibula lateral ray. Alignment is anatomic. There is soft tissue swelling surrounding the ankle. Medial  malleolar tip fracture again noted. Total fluoroscopy time: 49 seconds Total radiation dose: 1.63 micro Gy IMPRESSION: 1. Intraoperative fluoroscopy for ORIF of distal fibular fracture. Alignment is anatomic. 2. Medial malleolar tip fracture again noted. Electronically Signed   By: Ronney Asters M.D.   On: 06/02/2022 17:49   DG C-Arm 1-60 Min-No Report  Result Date: 06/02/2022 Fluoroscopy was utilized by the requesting physician.  No radiographic interpretation.   DG Ankle Complete Left  Result Date: 05/29/2022 CLINICAL DATA:  Left ankle fracture subluxation, post reduction imaging EXAM: LEFT ANKLE COMPLETE - 3+ VIEW COMPARISON:  05/28/2022 FINDINGS: Interval closed reduction of left ankle fracture subluxation. Distal left fibular diaphyseal fracture demonstrates 1/2 shaft with lateral displacement and mild posterior angulation. Medial joint spacel widening is improved with residual 1 cm lateral subluxation of the talar dome in relation to the tibial plafond. External immobilizer has been placed. Bimalleolar soft tissue swelling again noted. IMPRESSION: 1. Interval closed reduction of left ankle fracture subluxation. Residual lateral subluxation of the talar dome in relation to the tibial plafond. Electronically Signed   By: Fidela Salisbury M.D.   On: 05/29/2022 01:55   DG Ankle Complete Left  Result Date: 05/29/2022 CLINICAL DATA:  Fall, left ankle pain EXAM: LEFT ANKLE COMPLETE - 3+ VIEW COMPARISON:  None Available. FINDINGS: Acute fracture subluxation of the  left ankle noted. There is an oblique fracture of the distal left fibular diaphysis 3 cm proximal to the tibial plafond and with 1 shaft with lateral displacement and moderate posterior angulation of the distal fracture fragment. There is 2 cm lateral and 1.5 cm posterior subluxation of the talar dome in relation to the tibial plafond with marked widening of the medial joint space. Multiple ossific densities seen within the medial joint space appear corticated likely reflect the sequela of remote trauma or inflammation. There is moderate bimalleolar soft tissue swelling. Vascular calcifications are noted. IMPRESSION: 1. Acute fracture subluxation of the left ankle as described above. 2. Moderate bimalleolar soft tissue swelling. Electronically Signed   By: Fidela Salisbury M.D.   On: 05/29/2022 00:08   (Echo, Carotid, EGD, Colonoscopy, ERCP)    Subjective: Patient seen and examined.  No overnight events.  Received hemodialysis today.   Discharge Exam: Vitals:   06/09/22 1335 06/09/22 1354  BP: 118/60 (!) 119/47  Pulse:  80  Resp:  18  Temp: 98 F (36.7 C) (!) 97.5 F (36.4 C)  SpO2:  100%   Vitals:   06/09/22 1200 06/09/22 1230 06/09/22 1335 06/09/22 1354  BP: (!) 99/46 (!) 94/40 118/60 (!) 119/47  Pulse: 82 80  80  Resp: (!) _0 Temp:   98 F (36.7 C) (!) 97.5 F (36.4 C)  TempSrc:   Oral Axillary  SpO2: 100% 97%  100%  Weight:      Height:        General: Pt is alert, awake, not in acute distress Cardiovascular: RRR, S1/S2 +, no rubs, no gallops Respiratory: CTA bilaterally, no wheezing, no rhonchi Abdominal: Soft, NT, ND, bowel sounds + Extremities:  Patient has a right upper extremity AV fistula. His left ankle is on cast. Distal N/V status intact.    The results of significant diagnostics from this hospitalization (including imaging, microbiology, ancillary and laboratory) are listed below for reference.     Microbiology: No results found for this or any  previous visit (from the past 240 hour(s)).   Labs: BNP (last 3 results) No results for input(s): "  BNP" in the last 8760 hours. Basic Metabolic Panel: Recent Labs  Lab 06/05/22 0318 06/06/22 0058 06/07/22 0556 06/08/22 0134 06/09/22 0258  NA 140 137 137 136 135  K 4.6 4.5 4.4 4.8 5.4*  CL 92* 93* 92* 89* 91*  CO2 _0 GLUCOSE 127* 92 131* 141* 128*  BUN 68* 43* 73* 90* 116*  CREATININE 9.63* 6.87* 9.52* 10.58* 12.40*  CALCIUM 9.7 9.6 9.4 9.4 9.7  PHOS 6.1* 5.2* 6.1* 6.3* 6.5*   Liver Function Tests: Recent Labs  Lab 06/05/22 0318 06/06/22 0058 06/07/22 0556 06/08/22 0134 06/09/22 0258  ALBUMIN 2.6* 2.7* 2.7* 2.7* 2.7*   No results for input(s): "LIPASE", "AMYLASE" in the last 168 hours. No results for input(s): "AMMONIA" in the last 168 hours. CBC: Recent Labs  Lab 06/02/22 1600 06/05/22 0318 06/09/22 0258  WBC  --  8.1 16.1*  HGB 11.9* 9.3* 9.4*  HCT 35.0* 28.7* 28.5*  MCV  --  101.8* 101.1*  PLT  --  312 404*   Cardiac Enzymes: No results for input(s): "CKTOTAL", "CKMB", "CKMBINDEX", "TROPONINI" in the last 168 hours. BNP: Invalid input(s): "POCBNP" CBG: Recent Labs  Lab 06/08/22 0741 06/08/22 1118 06/08/22 1547 06/09/22 1400 06/09/22 1403  GLUCAP 120* 145* 141* 92 90   D-Dimer No results for input(s): "DDIMER" in the last 72 hours. Hgb A1c No results for input(s): "HGBA1C" in the last 72 hours. Lipid Profile No results for input(s): "CHOL", "HDL", "LDLCALC", "TRIG", "CHOLHDL", "LDLDIRECT" in the last 72 hours. Thyroid function studies No results for input(s): "TSH", "T4TOTAL", "T3FREE", "THYROIDAB" in the last 72 hours.  Invalid input(s): "FREET3" Anemia work up No results for input(s): "VITAMINB12", "FOLATE", "FERRITIN", "TIBC", "IRON", "RETICCTPCT" in the last 72 hours. Urinalysis    Component Value Date/Time   COLORURINE YELLOW 04/07/2019 2001   APPEARANCEUR CLOUDY (A) 04/07/2019 2001   LABSPEC 1.015 04/07/2019 2001    PHURINE 5.0 04/07/2019 2001   GLUCOSEU 50 (A) 04/07/2019 2001   GLUCOSEU NEGATIVE 03/25/2017 1309   HGBUR MODERATE (A) 04/07/2019 2001   BILIRUBINUR NEGATIVE 04/07/2019 2001   BILIRUBINUR negative 04/04/2015 1229   BILIRUBINUR neg 02/27/2015 [place] 04/07/2019 2001   PROTEINUR >=300 (A) 04/07/2019 2001   UROBILINOGEN 0.2 03/25/2017 1309   NITRITE NEGATIVE 04/07/2019 2001   LEUKOCYTESUR LARGE (A) 04/07/2019 2001   Sepsis Labs Recent Labs  Lab 06/05/22 0318 06/09/22 0258  WBC 8.1 16.1*   Microbiology No results found for this or any previous visit (from the past 240 hour(s)).   Time coordinating discharge: 35 minutes  SIGNED:   Barb Merino, MD  [facility] 06/09/2022, 2:36 PM

## 2022-06-09 NOTE — Progress Notes (Signed)
Received patient in bed to unit.  Alert and oriented.  Informed consent signed and in chart.   Treatment initiated: 0912 Treatment completed: 1311  Patient tolerated well.  Transported back to the room  Alert, without acute distress.  Hand-off given to patient's nurse.   Access used: RUA AVG Access issues: none  Total UF removed: 1900 Medication(s) given: Albumin 25 grams Post HD VS: BP: 118/71, RR: 85, 25, Pulse: O2sat: 100% Post HD weight: 89 kg    [facility]

## 2022-06-09 NOTE — Progress Notes (Signed)
Called [facility] twice to give report to RN. RN is not available at this time. Secretary states that RN will return call.

## 2022-06-09 NOTE — Progress Notes (Signed)
Contacted by CSW regarding pt's d/c to snf this afternoon. Contacted [facility] and spoke to [facility] advised pt will d/c to [facility] today and will resume care tomorrow.   Melven Sartorius Renal Navigator [phone]

## 2022-06-09 NOTE — Progress Notes (Signed)
PROGRESS NOTE    Rodney Farley  ZYS:[id number] DOB: September 10, 1931 DOA: 05/28/2022 PCP: [name], MD    Brief Narrative:  88 year old from home with extensive cardiac history including coronary artery disease, non-STEMI and multiple PCI's with recent PCI on 05/07/2022, recurrent in-stent restenosis of LAD, ischemic cardiomyopathy, chronic combined heart failure, complete heart block status post ICD CRT, ESRD on hemodialysis and obstructive sleep apnea presented to the emergency room on 11/17 following a mechanical fall at home the night prior and severe pain in his left ankle.  Patient was found to have left ankle fracture and admitted to the hospital.  After medically optimizing, patient underwent ORIF left ankle 11/21. Medically stabilizing.  Waiting for SNF bed.   Assessment & Plan:   Closed traumatic fracture and dislocation of the left ankle: ORIF 11/20 Dr. Mable Fill Nonweightbearing left lower extremity DVT prophylaxis with SCDs, aspirin 81 mg twice daily for 2 weeks. Adequate IV and oral opiates for pain relief, mobilize with PT OT.   Ortho to schedule postop follow-up. Stable to transfer to SNF.  ESRD on hemodialysis Chronic hypotension on midodrine with dialysis Chronic combined heart failure and ischemic cardiomyopathy status post AICD Coronary artery disease and multiple PCI's with recent PCI:  Chronic and stable.  Receiving dialysis on his schedule and followed by nephrology. Blood pressures are stable. Euvolemic and fluid balance maintained with hemodialysis. Recent LAD in-stent restenosis status post PCI on aspirin, Brilinta and a statin.  Patient is on atorvastatin and Zetia.  Repatha as outpatient.  Untreated sleep apnea.  Untreated.  He does not use CPAP.  Medically stabilized.  Transfer to SNF when bed available.    DVT prophylaxis: SCDs Start: 06/02/22 1842 heparin injection 5,000 Units Start: 05/29/22 0915   Code Status: Full code. Family  Communication: None. Disposition Plan: Status is: Inpatient Remains inpatient appropriate because: Waiting for skilled bed.     Consultants:  Nephrology Orthopedics Cardiology  Procedures:  ORIF left ankle 11/21  Antimicrobials:  Perioperative   Subjective: No new events.  Pain controlled.  Going for dialysis.  Objective: Vitals:   06/09/22 1100 06/09/22 1130 06/09/22 1200 06/09/22 1230  BP: (!) 116/44 95/77 (!) 99/46 (!) 94/40  Pulse: 80 80 82 80  Resp: 20 (!) 23 (!) 23 18  Temp:      TempSrc:      SpO2: 95% 100% 100% 97%  Weight:      Height:        Intake/Output Summary (Last 24 hours) at 06/09/2022 1302 Last data filed at 06/09/2022 0602 Gross per 24 hour  Intake 120 ml  Output 0 ml  Net 120 ml    Filed Weights   06/05/22 0841 06/09/22 0601 06/09/22 0855  Weight: 89.7 kg 87.4 kg 90.4 kg    Examination:  Looks comfortable.  On room air.   Data Reviewed: I have personally reviewed following labs and imaging studies  CBC: Recent Labs  Lab 06/02/22 1600 06/05/22 0318 06/09/22 0258  WBC  --  8.1 16.1*  HGB 11.9* 9.3* 9.4*  HCT 35.0* 28.7* 28.5*  MCV  --  101.8* 101.1*  PLT  --  312 404*    Basic Metabolic Panel: Recent Labs  Lab 06/05/22 0318 06/06/22 0058 06/07/22 0556 06/08/22 0134 06/09/22 0258  NA 140 137 137 136 135  K 4.6 4.5 4.4 4.8 5.4*  CL 92* 93* 92* 89* 91*  CO2 26 28 27 25 23   GLUCOSE 127* 92 131* 141* 128*  BUN  68* 43* 73* 90* 116*  CREATININE 9.63* 6.87* 9.52* 10.58* 12.40*  CALCIUM 9.7 9.6 9.4 9.4 9.7  PHOS 6.1* 5.2* 6.1* 6.3* 6.5*    GFR: Estimated Creatinine Clearance: 4.4 mL/min (A) (by C-G formula based on SCr of 12.4 mg/dL (H)). Liver Function Tests: Recent Labs  Lab 06/05/22 0318 06/06/22 0058 06/07/22 0556 06/08/22 0134 06/09/22 0258  ALBUMIN 2.6* 2.7* 2.7* 2.7* 2.7*    No results for input(s): "LIPASE", "AMYLASE" in the last 168 hours. No results for input(s): "AMMONIA" in the last 168  hours. Coagulation Profile: No results for input(s): "INR", "PROTIME" in the last 168 hours. Cardiac Enzymes: No results for input(s): "CKTOTAL", "CKMB", "CKMBINDEX", "TROPONINI" in the last 168 hours. BNP (last 3 results) No results for input(s): "PROBNP" in the last 8760 hours. HbA1C: No results for input(s): "HGBA1C" in the last 72 hours. CBG: Recent Labs  Lab 06/02/22 1802 06/07/22 2135 06/08/22 0741 06/08/22 1118 06/08/22 1547  GLUCAP 115* 174* 120* 145* 141*    Lipid Profile: No results for input(s): "CHOL", "HDL", "LDLCALC", "TRIG", "CHOLHDL", "LDLDIRECT" in the last 72 hours. Thyroid Function Tests: No results for input(s): "TSH", "T4TOTAL", "FREET4", "T3FREE", "THYROIDAB" in the last 72 hours. Anemia Panel: No results for input(s): "VITAMINB12", "FOLATE", "FERRITIN", "TIBC", "IRON", "RETICCTPCT" in the last 72 hours. Sepsis Labs: No results for input(s): "PROCALCITON", "LATICACIDVEN" in the last 168 hours.  No results found for this or any previous visit (from the past 240 hour(s)).       Radiology Studies: No results found.      Scheduled Meds:  (feeding supplement) PROSource Plus  30 mL Oral BID BM   acidophilus  1 capsule Oral Daily   aspirin EC  81 mg Oral BID   atorvastatin  80 mg Oral QHS   calcitRIOL  0.5 mcg Oral Q M,W,F-HD   carvedilol  3.125 mg Oral 2 times per day on Sun Tue Thu Sat   carvedilol  3.125 mg Oral Once per day on Mon Wed Fri   Chlorhexidine Gluconate Cloth  6 each Topical Q0600   Chlorhexidine Gluconate Cloth  6 each Topical Q0600   [START ON 06/10/2022] Chlorhexidine Gluconate Cloth  6 each Topical Q0600   cyanocobalamin  1,000 mcg Oral Daily   darbepoetin (ARANESP) injection - DIALYSIS  100 mcg Subcutaneous Q Tue-1800   docusate sodium  100 mg Oral BID   ezetimibe  10 mg Oral Daily   gabapentin  100 mg Oral QHS   heparin  5,000 Units Subcutaneous Q8H   isosorbide mononitrate  15 mg Oral Daily   melatonin  3 mg Oral QHS    midodrine  10 mg Oral Q M,W,F   midodrine  10 mg Oral Once in dialysis   multivitamin  1 tablet Oral Daily   NON FORMULARY 140 mg  140 mg Subcutaneous Once   pantoprazole  40 mg Oral Daily   polyethylene glycol  17 g Oral BID   senna  1 tablet Oral QHS   sevelamer carbonate  2,400 mg Oral BID WC   ticagrelor  90 mg Oral BID   Continuous Infusions:  albumin human        LOS: 11 days    Time spent: 25 minutes    Barb Merino, MD [facility] Pager [phone]

## 2022-06-09 NOTE — Progress Notes (Signed)
Rodney Farley Progress Note   Subjective:   Patient seen and examined at bedside during dialysis.  Reports tolerating treatment well.  Feeling better today.  Pain well controlled.  Denies CP, SOB, abdominal pain and n/v/d.    Objective Vitals:   06/09/22 1000 06/09/22 1030 06/09/22 1100 06/09/22 1130  BP: (!) 69/41 (!) 94/48 (!) 116/44 95/77  Pulse: 81 81 80 80  Resp: (!) 21 20 20  (!) 23  Temp:      TempSrc:      SpO2: 94% 96% 95% 100%  Weight:      Height:       Physical Exam General:chronically ill appearing male in NAD Heart:RRR, no mrg Lungs:CTAB, nml WOB on RA Abdomen:soft, NTND Extremities:no LE edema, l foot wrapped Dialysis Access: RU AVF in use   Filed Weights   06/05/22 0841 06/09/22 0601 06/09/22 0855  Weight: 89.7 kg 87.4 kg 90.4 kg    Intake/Output Summary (Last 24 hours) at 06/09/2022 1157 Last data filed at 06/09/2022 0602 Gross per 24 hour  Intake 120 ml  Output 0 ml  Net 120 ml    Additional Objective Labs: Basic Metabolic Panel: Recent Labs  Lab 06/07/22 0556 06/08/22 0134 06/09/22 0258  NA 137 136 135  K 4.4 4.8 5.4*  CL 92* 89* 91*  CO2 27 25 23   GLUCOSE 131* 141* 128*  BUN 73* 90* 116*  CREATININE 9.52* 10.58* 12.40*  CALCIUM 9.4 9.4 9.7  PHOS 6.1* 6.3* 6.5*   Liver Function Tests: Recent Labs  Lab 06/07/22 0556 06/08/22 0134 06/09/22 0258  ALBUMIN 2.7* 2.7* 2.7*    CBC: Recent Labs  Lab 06/02/22 1600 06/05/22 0318 06/09/22 0258  WBC  --  8.1 16.1*  HGB 11.9* 9.3* 9.4*  HCT 35.0* 28.7* 28.5*  MCV  --  101.8* 101.1*  PLT  --  312 404*    Medications:  albumin human      (feeding supplement) PROSource Plus  30 mL Oral BID BM   acidophilus  1 capsule Oral Daily   aspirin EC  81 mg Oral BID   atorvastatin  80 mg Oral QHS   calcitRIOL  0.5 mcg Oral Q M,W,F-HD   carvedilol  3.125 mg Oral 2 times per day on Sun Tue Thu Sat   carvedilol  3.125 mg Oral Once per day on Mon Wed Fri   Chlorhexidine  Gluconate Cloth  6 each Topical Q0600   Chlorhexidine Gluconate Cloth  6 each Topical Q0600   cyanocobalamin  1,000 mcg Oral Daily   darbepoetin (ARANESP) injection - DIALYSIS  100 mcg Subcutaneous Q Tue-1800   docusate sodium  100 mg Oral BID   ezetimibe  10 mg Oral Daily   gabapentin  100 mg Oral QHS   heparin  5,000 Units Subcutaneous Q8H   isosorbide mononitrate  15 mg Oral Daily   melatonin  3 mg Oral QHS   midodrine  10 mg Oral Q M,W,F   midodrine  10 mg Oral Once in dialysis   multivitamin  1 tablet Oral Daily   NON FORMULARY 140 mg  140 mg Subcutaneous Once   pantoprazole  40 mg Oral Daily   polyethylene glycol  17 g Oral BID   senna  1 tablet Oral QHS   sevelamer carbonate  2,400 mg Oral BID WC   ticagrelor  90 mg Oral BID    Dialysis Orders: MWF [facility]  4h   400/500  94kg  2/2 bath  Hep 2500+  1072midrun  RUA AVG - last HD 11/15, post 95.4kg - calcitriol 1.75 ug po tiw - mircera 52mcg q4, last 11/6   Assessment/Plan: L ankle fracture: S/p ORIF 11/21 - pain control per primary. ESRD: On MWF.  Off schedule today d/t refusing yesterday. Plan for HD tomorrow to get back on schedule.  Hypertension/volume: On midodrine pre-HD (as well as BB due to CAD). He does not appear overloaded at this time.  UF as tolerated.  HFrEF - EF 45-50% CAD/multiple PCI/stent: recurrent unstable angina, followed by cardiology.  Anemia of ESRD: Hgb 9.4- Aranesp 124mcg started q Tuesday Secondary HPTH: Phos mildly elevated, resumed binders. Corrected calcium elevated, decreased VDRA further to 0.57mcg 06/08/22. DM2 - on insulin, per pmd Nutrition - Currently on regular diet.  Alb low - protein supplements.  If K increases will need to change back to renal diet.  Dispo - Plan for to SNF, waiting for authorization  Rodney Mow, PA-C [facility] 06/09/2022,11:57 AM  LOS: 11 days

## 2022-06-09 NOTE — TOC Transition Note (Signed)
Transition of Care [facility]) - CM/SW Discharge Note   Patient Details  Name: Rodney Farley MRN: [id number] Date of Birth: 1932/02/18  [facility] CM/SW Contact:  Benard Halsted, LCSW Phone Number: 06/09/2022, 3:04 PM   Clinical Narrative:    Patient will DC to: [facility] Anticipated DC date: 06/09/22 Family notified: [name], daughter in Systems developer by: Corey Harold   Per MD patient ready for DC to [facility]. RN to call report prior to discharge [phone] room 3222). RN, patient, patient's family, and facility notified of DC. Discharge Summary and FL2 sent to facility. DC packet on chart including signed script and DNR. Ambulance transport requested for patient.   CSW will sign off for now as social work intervention is no longer needed. Please consult [place] again if new needs arise.     Final next level of care: Skilled Nursing Facility Barriers to Discharge: Barriers Resolved   Patient Goals and CMS Choice Patient states their goals for this hospitalization and ongoing recovery are:: To return home CMS Medicare.gov Compare Post Acute Care list provided to:: Patient    Discharge Placement   Existing PASRR number confirmed : 06/09/22          Patient [facility] bed at: [facility] Patient to be transferred to facility by: [place] Name of family member notified: Daughter in law Patient and family notified of of transfer: 06/09/22  Discharge Plan and Services   Discharge Planning Services: CM Consult Post Acute Care Choice: Resumption of Svcs/PTA Provider ([place])                    HH Arranged: PT, OT HH Agency: [place] Date [place]: 06/01/22 Time [place]: 1520 Representative spoke with at [place]: [place] ([place]) Interventions     Readmission Risk Interventions    05/08/2022    4:03 PM 04/02/2022    3:35 PM  Readmission Risk Prevention Plan   Transportation Screening Complete Complete  Medication Review Press photographer) Complete Complete  PCP or Specialist appointment within 3-5 days of discharge  Complete  HRI or [place] Complete Complete  SW Recovery Care/Counseling Consult Complete Complete  Palliative Care Screening Not Applicable Not [place] Not Applicable Not Applicable

## 2022-06-10 ENCOUNTER — Encounter (HOSPITAL_COMMUNITY): Payer: Self-pay | Admitting: Orthopedic Surgery

## 2022-06-15 ENCOUNTER — Telehealth: Payer: Self-pay

## 2022-06-15 NOTE — Telephone Encounter (Signed)
Called pt to schedule ED follow up with Dr.Hunter, please schedule if or when he calls back, I will be out of office at 2:30pm today

## 2022-06-16 ENCOUNTER — Ambulatory Visit: Payer: PPO | Admitting: Podiatry

## 2022-06-16 NOTE — Telephone Encounter (Signed)
I tried calling pt again, no answer. Left vm to give call back to schedule that ED follow- up with Dr.Hunter.

## 2022-06-28 NOTE — Progress Notes (Unsigned)
Cardiology Clinic Note   Patient Name: Rodney Farley Date of Encounter: 06/30/2022  Primary Care Provider:  [name], MD Primary Cardiologist:  Jamai [name], MD  Patient Profile    Rodney Farley 88 year old male presents the clinic today for follow-up evaluation of his ischemic cardiomyopathy coronary artery disease.  Past Medical History    Past Medical History:  Diagnosis Date   AICD (automatic cardioverter/defibrillator) present    [employer] pacer   Anemia    Anxiety and depression    Arthritis    Bell's palsy    left side. after shingles episode   BPH associated with nocturia    Chronic systolic CHF (congestive heart failure) ([place])    EF normalized by Echo 2019   COPD (chronic obstructive pulmonary disease) ([place])    Severe   Coronary artery disease    a. s/p MI in 1994/1995 while in [place] s/p questionable PCI. 03/2015: progression of disease, for staged PCI.   Diabetic peripheral neuropathy (HCC)    GERD (gastroesophageal reflux disease)    Gout    Hard of hearing    B/L   History of chronic pancreatitis 07/23/2017   noted on CT abd/pelvis   History of shingles    Hypercholesterolemia    Hypertension    Obesity    Sleep apnea    "sleeps w/humidifyer when he panics and gets short of breath" (04/08/2015)   TIA (transient ischemic attack) X 3   Trigger middle finger of left hand    Type II diabetes mellitus ([place])    Wears glasses    Wears hearing aid    Past Surgical History:  Procedure Laterality Date   APPENDECTOMY     AV FISTULA PLACEMENT Right 02/07/2019   Procedure: ARTERIOVENOUS (AV) FISTULA CREATION RIGHT UPPER ARM;  Surgeon: Waynetta Sandy, MD;  Location: [facility];  Service: Vascular;  Laterality: Right;   BIV ICD GENERATOR CHANGEOUT N/A 10/09/2021   Procedure: BIV ICD GENERATOR CHANGEOUT;  Surgeon: Deboraha Sprang, MD;  Location: [facility];  Service: Cardiovascular;  Laterality: N/A;   CARDIAC  CATHETERIZATION N/A 03/29/2015   Procedure: Right/Left Heart Cath and Coronary Angiography;  Surgeon: Tyreek M [name], MD;  Location: [facility];  Service: Cardiovascular;  Laterality: N/A;   [place]   "after my MI; put me on heart RX after cath"   CARDIAC CATHETERIZATION N/A 04/09/2015   Procedure: Coronary Stent Intervention;  Surgeon: Jojo M [name], MD;  Location: [facility];  Service: Cardiovascular;  Laterality: N/A;   CATARACT EXTRACTION W/ INTRAOCULAR LENS  IMPLANT, BILATERAL     CHOLECYSTECTOMY N/A 10/13/2017   Procedure: LAPAROSCOPIC CHOLECYSTECTOMY WITH LYSIS OF ADHESIONS;  Surgeon: Ileana Roup, MD;  Location: [facility];  Service: General;  Laterality: N/A;   COLONOSCOPY     CORONARY ANGIOGRAPHY N/A 11/12/2021   Procedure: CORONARY ANGIOGRAPHY;  Surgeon: Wellington Hampshire, MD;  Location: [facility];  Service: Cardiovascular;  Laterality: N/A;   CORONARY ANGIOGRAPHY N/A 12/24/2021   Procedure: CORONARY ANGIOGRAPHY;  Surgeon: Troy Sine, MD;  Location: [facility];  Service: Cardiovascular;  Laterality: N/A;   CORONARY ANGIOGRAPHY N/A 03/31/2022   Procedure: CORONARY ANGIOGRAPHY;  Surgeon: Jettie Booze, MD;  Location: [facility];  Service: Cardiovascular;  Laterality: N/A;   CORONARY ANGIOGRAPHY N/A 05/07/2022   Procedure: CORONARY ANGIOGRAPHY;  Surgeon: Jettie Booze, MD;  Location: [facility];  Service: Cardiovascular;  Laterality: N/A;   CORONARY BALLOON ANGIOPLASTY N/A 12/24/2021   Procedure: CORONARY BALLOON ANGIOPLASTY;  Surgeon: Troy Sine, MD;  Location: [facility];  Service: Cardiovascular;  Laterality: N/A;   CORONARY BALLOON ANGIOPLASTY N/A 02/09/2022   Procedure: CORONARY BALLOON ANGIOPLASTY;  Surgeon: Nelva Bush, MD;  Location: [facility];  Service: Cardiovascular;  Laterality: N/A;  LAD   CORONARY BALLOON ANGIOPLASTY N/A 03/31/2022   Procedure: CORONARY BALLOON  ANGIOPLASTY;  Surgeon: Jettie Booze, MD;  Location: [facility];  Service: Cardiovascular;  Laterality: N/A;   CORONARY BALLOON ANGIOPLASTY N/A 05/07/2022   Procedure: CORONARY BALLOON ANGIOPLASTY;  Surgeon: Jettie Booze, MD;  Location: [facility];  Service: Cardiovascular;  Laterality: N/A;   CORONARY STENT INTERVENTION N/A 11/12/2021   Procedure: CORONARY STENT INTERVENTION;  Surgeon: Wellington Hampshire, MD;  Location: [facility];  Service: Cardiovascular;  Laterality: N/A;  LAD   DENTAL SURGERY     EP IMPLANTABLE DEVICE N/A 09/23/2015   MDT CRT-D, Dr. Caryl Comes   HIATAL HERNIA REPAIR  1977   ILEOCECETOMY N/A 03/27/2017   Procedure: ILEOCECECTOMY;  Surgeon: Ileana Roup, MD;  Location: [facility];  Service: General;  Laterality: N/A;   INSERT / REPLACE / REMOVE PACEMAKER  07/2008   Complete heart block status post DDD with good function   INTRAVASCULAR IMAGING/OCT N/A 12/24/2021   Procedure: INTRAVASCULAR IMAGING/OCT;  Surgeon: Troy Sine, MD;  Location: [facility];  Service: Cardiovascular;  Laterality: N/A;   INTRAVASCULAR LITHOTRIPSY  02/09/2022   Procedure: INTRAVASCULAR LITHOTRIPSY;  Surgeon: Nelva Bush, MD;  Location: [facility];  Service: Cardiovascular;;  LAD   INTRAVASCULAR ULTRASOUND/IVUS N/A 02/09/2022   Procedure: Intravascular Ultrasound/IVUS;  Surgeon: Nelva Bush, MD;  Location: [facility];  Service: Cardiovascular;  Laterality: N/A;  LAD   IR FLUORO GUIDE CV LINE RIGHT  04/03/2019   IR US GUIDE VASC ACCESS RIGHT  04/03/2019   LAPAROTOMY N/A 03/27/2017   Procedure: EXPLORATORY LAPAROTOMY;  Surgeon: Ileana Roup, MD;  Location: [facility];  Service: General;  Laterality: N/A;   ORIF ANKLE FRACTURE Left 06/02/2022   Procedure: OPEN REDUCTION INTERNAL FIXATION (ORIF) LEFT LATERAL MALLEOLUS;  Surgeon: Georgeanna Harrison, MD;  Location: [facility];  Service: Orthopedics;  Laterality: Left;   RIGHT HEART CATH AND CORONARY  ANGIOGRAPHY N/A 02/09/2022   Procedure: RIGHT HEART CATH AND CORONARY ANGIOGRAPHY;  Surgeon: Nelva Bush, MD;  Location: [facility];  Service: Cardiovascular;  Laterality: N/A;   TONSILLECTOMY     UPPER GI ENDOSCOPY      Allergies  Allergies  Allergen Reactions   Bee Venom Anaphylaxis   Lyrica [Pregabalin] Other (See Comments)    hallucinations   Prednisone Other (See Comments)    hallucinations   Zocor [Simvastatin] Nausea Only and Other (See Comments)    Headache with brand name only.  Can take the generic.    History of Present Illness    Ebert Forrester is a PMH of complete AV block status post PPM, ICD 2017 with last generator change out in 3/23, ischemic cardiomyopathy, HTN, coronary artery disease, atrial fibrillation, hypotension, chronic systolic CHF, sleep apnea, COPD, GERD, type 2 diabetes, diabetic neuropathy, Bell's palsy, end-stage renal disease on HD, thrombocytopenia, obesity, and hyperlipidemia.  He had a remote MI while in [place] in 1994.  He underwent cardiac catheterization with PCI of his LAD, second diagonal and OM 09/2014.  He  had an NSTEMI 5/23 and underwent DES to his ostial LAD.  His EF was noted to be 25-30% 2016.  His subsequent echocardiogram showed normalization and then drop in his EF to 45-50% on echocardiogram 11/11/2021.  He was noted to have atrial fibrillation on device interrogation in 2001.  He was not placed on anticoagulation therapy due to low A-fib burden.  He was admitted in May for NSTEMI.  He noted sharp substernal chest pain that radiated to his left upper extremity.  His troponins elevated to 4677.  He underwent cardiac catheterization 11/12/2021 which showed 90% stenosis of ostial-proximal LAD, 90% stenosis of ostial D1, 70% stenosis of the ostial proximal RCA and mild disease of the OM 3, D2 and LPA be.  He underwent PCI with DES to his ostial-proximal LAD.  He was continued on Plavix and Lipitor as well as aspirin.  He was  admitted 6/23 after presenting with chest pain.  His troponins trended up to 1073.  Repeat cardiac catheterization showed 90% in-stent restenosis of his distal left main to ostial LAD with thrombus and the stent was underexpanded.  He underwent difficult but successful PCI with PTCA of the lesion.  His carvedilol was discontinued due to low blood pressure.  He was readmitted 02/08/2022 with NSTEMI and his troponins peaked at 498.  Cardiac catheterization showed restenosis of proximal LAD stent intervascular imaging showed stent was not fully expanded due to calcification patient underwent intracoronary lithotripsy and aggressive dilation with 4.0 balloon.  Additional stenting was not attempted due to inability to expand stent Foley.  He was seen by Dr. [name] 02/17/2022 and was doing well at that time.  He was readmitted to the hospital on 5/23 with recurrent chest pain.  His troponins were minimally elevated .  He underwent cardiac catheterization on 03/31/2022 which showed 70% ostial-proximal RCA, 90% distal left main, ostial LAD in-stent restenosis treated with balloon angioplasty.  His course was complicated by seizure-like activity.  He underwent EEG which showed no seizure or epileptic episodes.  He was seen 04/21/2022 and was doing okay at that time.  He had been started on Repatha.  He had been again admitted 10/23 with NSTEMI.  He underwent cardiac catheterization by Dr. Claiborne Billings on 05/07/2022 which showed 70% ostial-proximal RCA (small vessel) 40% distal circumflex artery, 50% OM 3, 30% D2, 90% distal left main-ostial LAD which was treated with balloon angioplasty.  His Plavix was discontinued and he was placed on Brilinta.  He was seen by Almyra Deforest PA-C on 05/19/2022.  He was accompanied by his daughter.  He was tolerating his medication well and denied further chest discomfort.  According to his daughter he had chest pain for 3-4 days prior to presenting to the hospital.  It was not clear if he was a  Plavix nonresponder.  A P2 Y12 study was not completed.  Dr. Claiborne Billings previously mentioned referring patient to brachytherapy.  Dr.Varanasi was consulted and it was unclear where the therapy was conducted.  Fasting lipids were planned for 2 weeks.  He was admitted to the hospital on 05/18/2022 and discharged on 06/09/2022.  He had a fall and sustained left ankle fracture which required ORIF (06/02/2022).  Patient remained in the hospital while waiting for a skilled nursing bed for rehab.  He presents to the clinic today for follow-up evaluation states he continues to progress his physical activity after his ankle fracture.  He is at rehab and is doing physical therapy daily.  He reports compliance with HD  Monday Wednesday Friday.  We reviewed his past catheterizations. He had an injection on Friday a week ago and will get back on his medication regimen at this time.  He denies chest pain.  He does report some shortness of breath with increased physical activity.  Today he denies chest pain, y edema, fatigue, palpitations, melena, hematuria, hemoptysis, diaphoresis, weakness, presyncope, syncope, orthopnea, and PND.    Home Medications    Prior to Admission medications   Medication Sig Start Date End Date Taking? Authorizing Provider  acetaminophen (TYLENOL) 500 MG tablet Take 500 mg by mouth as needed for moderate pain.    [provider]  aspirin EC 81 MG tablet Take 81 mg by mouth daily.    [provider]  atorvastatin (LIPITOR) 80 MG tablet Take 1 tablet by mouth daily. Patient taking differently: Take 80 mg by mouth at bedtime. 11/14/21   Margie Billet, PA-C  B Complex-C-Folic Acid (NEPHRO VITAMINS) 0.8 MG TABS Take 1 tablet by mouth daily.    [provider]  calcitRIOL (ROCALTROL) 0.25 MCG capsule Take 7 capsules (1.75 mcg total) by mouth every Monday, Wednesday, and Friday with hemodialysis. Patient taking differently: Take by mouth every Monday, Wednesday, and  Friday with hemodialysis. 01/26/22   [name], Hoyle M, MD  carbamide peroxide (DEBROX) 6.5 % OTIC solution Place 5 drops into both ears 2 (two) times daily as needed (earwax). Patient taking differently: Place 5 drops into both ears as needed (earwax). 11/13/21   Lelon Perla, MD  Carboxymethylcellul-Glycerin (LUBRICATING EYE DROPS OP) Place 2-3 drops into both eyes in the morning, at noon, and at bedtime.    [provider]  carvedilol (COREG) 3.125 MG tablet Take 1 tablet (3.125 mg total) by mouth 2 (two) times daily with a meal. Hold for SBP <100 mmhg OR hr < 60/min Patient taking differently: Take 3.125 mg by mouth See admin instructions. Take 3.125 twice daily on Tuesday, Thursday, Saturday, and Sunday. Take 3.125 mg in the evening on Monday, Wednesday, Friday. 05/08/22 06/07/22  Antonieta Pert, MD  docusate sodium (COLACE) 100 MG capsule Take 1 capsule (100 mg total) by mouth 2 (two) times daily. 06/09/22   Barb Merino, MD  Evolocumab (REPATHA SURECLICK) 638 MG/ML SOAJ Inject 140 mg into the skin every 14 (fourteen) days. 04/22/22   [name], Ramzy M, MD  ezetimibe (ZETIA) 10 MG tablet Take 1 tablet (10 mg total) by mouth daily. 05/08/22   Antonieta Pert, MD  gabapentin (NEURONTIN) 100 MG capsule Take 1 capsule (100 mg total) by mouth at bedtime. 06/09/22   Barb Merino, MD  glucose blood (FREESTYLE TEST STRIPS) test strip Use to check blood sugar daily 05/01/19   [name], MD  glucose monitoring kit (FREESTYLE) monitoring kit USE TO MONITOR BLOOD GLUCOSE AS DIRECTED 05/01/19   [name], MD  HYDROcodone-acetaminophen (NORCO/VICODIN) 5-325 MG tablet Take 1 tablet by mouth every 6 (six) hours as needed for severe pain (postoperative pain). 06/03/22 06/03/23  Georgeanna Harrison, MD  isosorbide mononitrate (IMDUR) 30 MG 24 hr tablet Take 1/2 tablet (15 mg total) by mouth daily. 05/09/22 06/08/22  Antonieta Pert, MD  melatonin 3 MG TABS tablet Take 1 tablet (3 mg total) by mouth at  bedtime. 06/09/22   Barb Merino, MD  midodrine (PROAMATINE) 10 MG tablet Take 0.5-1 tablets (5-10 mg total) by mouth See admin instructions. Take 1 tablet (10 mg) three times a week. Add 5 mg a night after dialysis as needed for low  blood pressure. Patient taking differently: Take 5-10 mg by mouth See admin instructions. Take 1 tablet (10 mg) three times a week on dialysis days. May take 5 mg a night after dialysis as needed for low blood pressure. 04/02/22   Aline August, MD  multivitamin (RENA-VIT) TABS tablet Take 1 tablet by mouth daily. 08/22/21   [name], MD  nitroGLYCERIN (NITROSTAT) 0.4 MG SL tablet Place 1 tablet under the tongue every 5 minutes x 3 doses as needed for chest pain. 11/13/21   Margie Billet, PA-C  oxymetazoline (AFRIN) 0.05 % nasal spray Place 1 spray into both nostrils 2 (two) times daily as needed for congestion. Patient taking differently: Place 3-4 sprays into both nostrils as needed (nose bleeds). 12/29/21   [name], MD  pantoprazole (PROTONIX) 40 MG tablet Take 1 tablet by mouth daily. 11/14/21   Margie Billet, PA-C  saccharomyces boulardii (FLORASTOR) 250 MG capsule Take 1 capsule (250 mg total) by mouth 2 (two) times daily. Patient taking differently: Take 250 mg by mouth every evening. 04/08/19   Regalado, Belkys A, MD  sevelamer carbonate (RENVELA) 800 MG tablet Take 2 tablets (1,600 mg total) by mouth 3 (three) times daily with meals. Patient taking differently: Take 800-2,400 mg by mouth See admin instructions. Take 2400 mg  by mouth twice a day. Take 302 496 9929 mg by mouth with snacks. 11/13/21   Lelon Perla, MD  ticagrelor (BRILINTA) 90 MG TABS tablet Take 1 tablet (90 mg total) by mouth 2 (two) times daily. 05/08/22   Antonieta Pert, MD  vitamin B-12 (CYANOCOBALAMIN) 1000 MCG tablet Take 1,000 mcg by mouth daily.    [provider]    Family History    Family History  Problem Relation Age of Onset   Stroke Mother     Leukemia Father    Stroke Sister    Heart attack Brother    He indicated that his mother is deceased. He indicated that his father is deceased. He indicated that his sister is alive. He indicated that his brother is alive. He indicated that his maternal grandmother is deceased. He indicated that his maternal grandfather is deceased. He indicated that his paternal grandmother is deceased. He indicated that his paternal grandfather is deceased.  Social History    Social History   Socioeconomic History   Marital status: Legally Separated    Spouse name: Not on file   Number of children: 1   Years of education: college   Highest education level: Not on file  Occupational History   Occupation: Retired  Tobacco Use   Smoking status: Former    Packs/day: 1.50    Years: 54.00    Total pack years: 81.00    Types: Cigarettes    Quit date: 07/18/2007    Years since quitting: 14.9    Passive exposure: Never   Smokeless tobacco: Never  Vaping Use   Vaping Use: Never used  Substance and Sexual Activity   Alcohol use: Yes    Comment: rare   Drug use: No   Sexual activity: Not Currently  Other Topics Concern   Not on file  Social History Narrative   Married 1994 (together since 1989) 1 son, 1 stepson. 3 grandkids, 6 great grandkids      Retired from [employer]. Had 2 years of collge.       Faith: Mormon      Here on [employer] since 2004 from [place];    Social  Determinants of Health   Financial Resource Strain: Not on file  Food Insecurity: No Food Insecurity (05/07/2022)   Hunger Vital Sign    Worried About Running Out of Food in the Last Year: Never true    Ran Out of Food in the Last Year: Never true  Transportation Needs: No Transportation Needs (05/07/2022)   PRAPARE - Hydrologist (Medical): No    Lack of Transportation (Non-Medical): No  Physical Activity: Not on file  Stress: Not on file  Social Connections: Not on file   Intimate Partner Violence: Not At Risk (05/07/2022)   Humiliation, Afraid, Rape, and Kick questionnaire    Fear of Current or Ex-Partner: No    Emotionally Abused: No    Physically Abused: No    Sexually Abused: No     Review of Systems    General:  No chills, fever, night sweats or weight changes.  Cardiovascular:  No chest pain, dyspnea on exertion, edema, orthopnea, palpitations, paroxysmal nocturnal dyspnea. Dermatological: No rash, lesions/masses Respiratory: No cough, dyspnea Urologic: No hematuria, dysuria Abdominal:   No nausea, vomiting, diarrhea, bright red blood per rectum, melena, or hematemesis Neurologic:  No visual changes, wkns, changes in mental status. All other systems reviewed and are otherwise negative except as noted above.  Physical Exam    VS:  BP (!) 122/44 (BP Location: Left Arm, Patient Position: Sitting, Cuff Size: Large)   Pulse 80   SpO2 96%  , BMI There is no height or weight on file to calculate BMI. GEN: Well nourished, well developed, in no acute distress. HEENT: normal. Neck: Supple, no JVD, carotid bruits, or masses. Cardiac: RRR, no murmurs, rubs, or gallops. No clubbing, cyanosis, edema.  Radials/DP/PT 2+ and equal bilaterally.  Respiratory:  Respirations regular and unlabored, clear to auscultation bilaterally. GI: Soft, nontender, nondistended, BS + x 4. MS: no deformity or atrophy. Skin: warm and dry, no rash. Neuro:  Strength and sensation are intact. Psych: Normal affect.  Accessory Clinical Findings    Recent Labs: 11/11/2021: TSH 1.400 03/30/2022: Magnesium 2.0 05/06/2022: ALT 22 06/09/2022: BUN 116; Creatinine, Ser 12.40; Hemoglobin 9.4; Platelets 404; Potassium 5.4; Sodium 135   Recent Lipid Panel    Component Value Date/Time   CHOL 153 01/06/2022 1228   TRIG 197 (H) 01/06/2022 1228   HDL 41 01/06/2022 1228   CHOLHDL 3.7 01/06/2022 1228   CHOLHDL 4.2 11/12/2021 0554   VLDL 47 (H) 11/12/2021 0554   LDLCALC 79 01/06/2022  1228   LDLDIRECT 60.0 04/13/2018 0926         ECG personally reviewed by me today-none today.  Echocardiogram 05/07/2022 IMPRESSIONS     1. Left ventricular ejection fraction, by estimation, is 45 to 50%. The  left ventricle has mildly decreased function. The left ventricle  demonstrates regional wall motion abnormalities (see scoring  diagram/findings for description). Left ventricular  diastolic parameters are indeterminate.   2. Right ventricular systolic function is normal. The right ventricular  size is normal. There is normal pulmonary artery systolic pressure.   3. The mitral valve is normal in structure. Trivial mitral valve  regurgitation. No evidence of mitral stenosis. Moderate mitral annular  calcification.   4. The aortic valve is normal in structure. There is mild calcification  of the aortic valve. There is mild thickening of the aortic valve. Aortic  valve regurgitation is trivial. Moderate aortic valve stenosis. Aortic  valve area, by VTI measures 1.42  cm. Aortic valve mean gradient  measures 20.0 mmHg. Aortic valve Vmax  measures 2.97 m/s.   5. The inferior vena cava is normal in size with greater than 50%  respiratory variability, suggesting right atrial pressure of 3 mmHg.   Comparison(s): A prior study was performed on 11/2021. No significant  change from prior study.   FINDINGS   Left Ventricle: Left ventricular ejection fraction, by estimation, is 45  to 50%. The left ventricle has mildly decreased function. The left  ventricle demonstrates regional wall motion abnormalities. Definity  contrast agent was given IV to delineate the  left ventricular endocardial borders. The left ventricular internal cavity  size was normal in size. There is borderline concentric left ventricular  hypertrophy. Left ventricular diastolic function could not be evaluated  due to mitral annular  calcification (moderate or greater). Left ventricular diastolic parameters   are indeterminate.     LV Wall Scoring:  The apical septal segment, apical inferior segment, and apex are  hypokinetic.  The entire anterior wall, entire lateral wall, anterior septum, inferior  wall, mid inferoseptal segment, and basal inferoseptal segment are normal.   Right Ventricle: The right ventricular size is normal. No increase in  right ventricular wall thickness. Right ventricular systolic function is  normal. There is normal pulmonary artery systolic pressure. The tricuspid  regurgitant velocity is 2.12 m/s, and   with an assumed right atrial pressure of 3 mmHg, the estimated right  ventricular systolic pressure is 27.7 mmHg.   Left Atrium: Left atrial size was normal in size.   Right Atrium: Right atrial size was normal in size.   Pericardium: There is no evidence of pericardial effusion. Presence of  epicardial fat layer.   Mitral Valve: The mitral valve is normal in structure. Moderate mitral  annular calcification. Trivial mitral valve regurgitation. No evidence of  mitral valve stenosis.   Tricuspid Valve: The tricuspid valve is normal in structure. Tricuspid  valve regurgitation is not demonstrated. No evidence of tricuspid  stenosis.   Aortic Valve: The aortic valve is normal in structure. There is mild  calcification of the aortic valve. There is mild thickening of the aortic  valve. There is moderate aortic valve annular calcification. Aortic valve  regurgitation is trivial. Moderate  aortic stenosis is present. Aortic valve mean gradient measures 20.0 mmHg.  Aortic valve peak gradient measures 35.2 mmHg. Aortic valve area, by VTI  measures 1.42 cm.   Pulmonic Valve: The pulmonic valve was normal in structure. Pulmonic valve  regurgitation is not visualized. No evidence of pulmonic stenosis.   Aorta: The aortic root is normal in size and structure.   Venous: The inferior vena cava is normal in size with greater than 50%  respiratory variability,  suggesting right atrial pressure of 3 mmHg.   IAS/Shunts: No atrial level shunt detected by color flow Doppler.   Cardiac catheterization 05/07/2022    Ost RCA to Prox RCA lesion is 70% stenosed.  Small vessel.   Prox Cx lesion is 30% stenosed.   Mid Cx to Dist Cx lesion is 40% stenosed.   Mid Cx lesion is 30% stenosed.   2nd Diag-1 lesion is 30% stenosed.   3rd Mrg lesion is 50% stenosed.   Non-stenotic 2nd Diag-2 lesion was previously treated.  Patent stent.   Dist LM to Ost LAD lesion is 90% stenosed.   Scoring balloon angioplasty was performed using a BALLN SCOREFLEX 3.50X10.   Post intervention, there is a 20% residual stenosis.   Another episode of restenosis in the  ostial to proximal LAD.  Successful balloon angioplasty with score flex balloon at high pressure followed by Hollister balloon at high pressure.   Unfortunately, there are no other options at our facility.  We will need to look into availability of drug-coated balloon or brachytherapy as possible treatments if this recurs.  Assessment & Plan   1.  Coronary artery disease-no chest pain today.  Has undergone multiple cardiac catheterizations.  Cardiac catheterization 05/07/2022 showed ostial RCA-proximal RCA 70% (small vessel, proximal circumflex lesion 30%, mid circumflex-distal circumflex 40%, mid circumflex 30%, second diagonal 30%, third marginal 50%,  second diagonal with patent stent.  He underwent scoring balloon angioplasty.  No other options for treatment available at our facilities.  Plan for drug-coated balloon versus brachytherapy as possible treatments if recurrence. Continue Brilinta, Repatha, Imdur, ezetimibe, carvedilol, aspirin, atorvastatin  Hyperlipidemia-11/12/2021: VLDL 47 01/06/2022: Cholesterol, Total 153; HDL 41; LDL Chol Calc (NIH) 79; Triglycerides 197 Continue atorvastatin, Repatha Heart healthy low-sodium high-fiber diet Increase physical activity as tolerated  Ischemic cardiomyopathy-no increased  DOE.  Activity level stable.  Echocardiogram 10/23 showed an EF of 45-50% with intermediate diastolic parameters and no significant valvular abnormalities. Continue carvedilol, Imdur Heart healthy low-sodium diet-salty 6 given Increase physical activity as tolerated   Essential hypertension-BP today 122/44 Continue carvedilol, Imdur Heart healthy low-sodium diet Increase physical activity as tolerated  Type 2 diabetes-reports compliance with medication. Follows with PCP  End-stage renal disease-on hemodialysis MWF. Follows with nephrology.  Disposition: Follow-up with Dr. [name] or Almyra Deforest PA-C in 3 months.   Jossie Ng. Ramonia Mcclaran NP-C     06/30/2022, 4:27 PM [place] [address] Office [phone] Fax [phone]    I spent 14 minutes examining this patient, reviewing medications, and using patient centered shared decision making involving her cardiac care.  Prior to her visit I spent greater than 20 minutes reviewing her past medical history,  medications, and prior cardiac tests.

## 2022-06-29 ENCOUNTER — Telehealth: Payer: Self-pay | Admitting: Podiatry

## 2022-06-29 NOTE — Telephone Encounter (Signed)
Lmom to call back to pick . Spoke will [name] and she will call back Tuesday and let us know when she will come pick them up. Patient currently has a broken foot and can't wear them , he is also in a facility so she can't bring him out

## 2022-06-30 ENCOUNTER — Ambulatory Visit: Payer: PPO | Attending: General Practice | Admitting: General Practice

## 2022-06-30 ENCOUNTER — Encounter: Payer: Self-pay | Admitting: General Practice

## 2022-06-30 VITALS — BP 122/44 | HR 80

## 2022-06-30 DIAGNOSIS — E785 Hyperlipidemia, unspecified: Secondary | ICD-10-CM

## 2022-06-30 DIAGNOSIS — E119 Type 2 diabetes mellitus without complications: Secondary | ICD-10-CM

## 2022-06-30 DIAGNOSIS — I255 Ischemic cardiomyopathy: Secondary | ICD-10-CM | POA: Diagnosis not present

## 2022-06-30 DIAGNOSIS — I10 Essential (primary) hypertension: Secondary | ICD-10-CM | POA: Diagnosis not present

## 2022-06-30 DIAGNOSIS — Z992 Dependence on renal dialysis: Secondary | ICD-10-CM

## 2022-06-30 DIAGNOSIS — N186 End stage renal disease: Secondary | ICD-10-CM

## 2022-06-30 DIAGNOSIS — I2510 Atherosclerotic heart disease of native coronary artery without angina pectoris: Secondary | ICD-10-CM | POA: Diagnosis not present

## 2022-06-30 NOTE — Patient Instructions (Addendum)
Medication Instructions:  The current medical regimen is effective;  continue present plan and medications as directed. Please refer to the Current Medication list given to you today.  *If you need a refill on your cardiac medications before your next appointment, please call your pharmacy*   Lab Work: NONE If you have labs (blood work) drawn today and your tests are completely normal, you will receive your results only by: [place] (if you have MyChart) OR A paper copy in the mail If you have any lab test that is abnormal or we need to change your treatment, we will call you to review the results.   Testing/Procedures: NONE   Follow-Up: At [facility], you and your health needs are our priority.  As part of our continuing mission to provide you with exceptional heart care, we have created designated Provider Care Teams.  These Care Teams include your primary Cardiologist (physician) and Advanced Practice Providers (APPs -  Physician Assistants and Nurse Practitioners) who all work together to provide you with the care you need, when you need it.  Your next appointment:   3 month(s)  The format for your next appointment:   In Person  Provider:   Matson [name], MD OR HAO MENG, PA-C  Other Instructions PLEASE READ AND FOLLOW ATTACHED  SALTY 6   CONT PHYSICAL ACTIVITY  Important Information About Sugar

## 2022-07-15 DIAGNOSIS — N186 End stage renal disease: Secondary | ICD-10-CM | POA: Diagnosis not present

## 2022-07-15 DIAGNOSIS — Z992 Dependence on renal dialysis: Secondary | ICD-10-CM | POA: Diagnosis not present

## 2022-07-15 DIAGNOSIS — D631 Anemia in chronic kidney disease: Secondary | ICD-10-CM | POA: Diagnosis not present

## 2022-07-15 DIAGNOSIS — D689 Coagulation defect, unspecified: Secondary | ICD-10-CM | POA: Diagnosis not present

## 2022-07-15 DIAGNOSIS — E876 Hypokalemia: Secondary | ICD-10-CM | POA: Diagnosis not present

## 2022-07-15 DIAGNOSIS — N2581 Secondary hyperparathyroidism of renal origin: Secondary | ICD-10-CM | POA: Diagnosis not present

## 2022-07-20 DIAGNOSIS — E1129 Type 2 diabetes mellitus with other diabetic kidney complication: Secondary | ICD-10-CM | POA: Diagnosis not present

## 2022-07-20 DIAGNOSIS — N2581 Secondary hyperparathyroidism of renal origin: Secondary | ICD-10-CM | POA: Diagnosis not present

## 2022-07-20 DIAGNOSIS — D689 Coagulation defect, unspecified: Secondary | ICD-10-CM | POA: Diagnosis not present

## 2022-07-20 DIAGNOSIS — D631 Anemia in chronic kidney disease: Secondary | ICD-10-CM | POA: Diagnosis not present

## 2022-07-20 DIAGNOSIS — N186 End stage renal disease: Secondary | ICD-10-CM | POA: Diagnosis not present

## 2022-07-20 DIAGNOSIS — Z992 Dependence on renal dialysis: Secondary | ICD-10-CM | POA: Diagnosis not present

## 2022-07-21 DIAGNOSIS — I10 Essential (primary) hypertension: Secondary | ICD-10-CM | POA: Diagnosis not present

## 2022-07-21 DIAGNOSIS — I200 Unstable angina: Secondary | ICD-10-CM | POA: Diagnosis not present

## 2022-07-21 DIAGNOSIS — I5022 Chronic systolic (congestive) heart failure: Secondary | ICD-10-CM | POA: Diagnosis not present

## 2022-07-21 DIAGNOSIS — I459 Conduction disorder, unspecified: Secondary | ICD-10-CM | POA: Diagnosis not present

## 2022-07-21 DIAGNOSIS — G4700 Insomnia, unspecified: Secondary | ICD-10-CM | POA: Diagnosis not present

## 2022-07-21 DIAGNOSIS — S82842D Displaced bimalleolar fracture of left lower leg, subsequent encounter for closed fracture with routine healing: Secondary | ICD-10-CM | POA: Diagnosis not present

## 2022-07-21 DIAGNOSIS — I25810 Atherosclerosis of coronary artery bypass graft(s) without angina pectoris: Secondary | ICD-10-CM | POA: Diagnosis not present

## 2022-07-21 DIAGNOSIS — I5040 Unspecified combined systolic (congestive) and diastolic (congestive) heart failure: Secondary | ICD-10-CM | POA: Diagnosis not present

## 2022-07-26 ENCOUNTER — Emergency Department (HOSPITAL_COMMUNITY): Payer: PPO

## 2022-07-26 ENCOUNTER — Other Ambulatory Visit: Payer: Self-pay

## 2022-07-26 ENCOUNTER — Inpatient Hospital Stay (HOSPITAL_COMMUNITY)
Admission: EM | Admit: 2022-07-26 | Discharge: 2022-07-31 | DRG: 291 | Disposition: A | Discharge status: Skilled Nursing Facility | Payer: PPO | Source: Skilled Nursing Facility | Attending: Internal Medicine | Admitting: Internal Medicine

## 2022-07-26 ENCOUNTER — Encounter (HOSPITAL_COMMUNITY): Payer: Self-pay

## 2022-07-26 DIAGNOSIS — N401 Enlarged prostate with lower urinary tract symptoms: Secondary | ICD-10-CM | POA: Diagnosis present

## 2022-07-26 DIAGNOSIS — G4733 Obstructive sleep apnea (adult) (pediatric): Secondary | ICD-10-CM | POA: Diagnosis present

## 2022-07-26 DIAGNOSIS — N186 End stage renal disease: Secondary | ICD-10-CM | POA: Diagnosis present

## 2022-07-26 DIAGNOSIS — Z823 Family history of stroke: Secondary | ICD-10-CM

## 2022-07-26 DIAGNOSIS — Z87891 Personal history of nicotine dependence: Secondary | ICD-10-CM

## 2022-07-26 DIAGNOSIS — E7800 Pure hypercholesterolemia, unspecified: Secondary | ICD-10-CM | POA: Diagnosis not present

## 2022-07-26 DIAGNOSIS — I5021 Acute systolic (congestive) heart failure: Secondary | ICD-10-CM | POA: Diagnosis not present

## 2022-07-26 DIAGNOSIS — Z8619 Personal history of other infectious and parasitic diseases: Secondary | ICD-10-CM

## 2022-07-26 DIAGNOSIS — E8779 Other fluid overload: Secondary | ICD-10-CM | POA: Diagnosis not present

## 2022-07-26 DIAGNOSIS — Z8673 Personal history of transient ischemic attack (TIA), and cerebral infarction without residual deficits: Secondary | ICD-10-CM

## 2022-07-26 DIAGNOSIS — Z992 Dependence on renal dialysis: Secondary | ICD-10-CM

## 2022-07-26 DIAGNOSIS — Z95810 Presence of automatic (implantable) cardiac defibrillator: Secondary | ICD-10-CM | POA: Diagnosis present

## 2022-07-26 DIAGNOSIS — Z961 Presence of intraocular lens: Secondary | ICD-10-CM | POA: Diagnosis present

## 2022-07-26 DIAGNOSIS — I255 Ischemic cardiomyopathy: Secondary | ICD-10-CM | POA: Diagnosis not present

## 2022-07-26 DIAGNOSIS — I120 Hypertensive chronic kidney disease with stage 5 chronic kidney disease or end stage renal disease: Secondary | ICD-10-CM | POA: Diagnosis not present

## 2022-07-26 DIAGNOSIS — R531 Weakness: Secondary | ICD-10-CM | POA: Diagnosis not present

## 2022-07-26 DIAGNOSIS — I509 Heart failure, unspecified: Secondary | ICD-10-CM

## 2022-07-26 DIAGNOSIS — I2510 Atherosclerotic heart disease of native coronary artery without angina pectoris: Secondary | ICD-10-CM | POA: Diagnosis not present

## 2022-07-26 DIAGNOSIS — J9601 Acute respiratory failure with hypoxia: Secondary | ICD-10-CM | POA: Diagnosis not present

## 2022-07-26 DIAGNOSIS — D696 Thrombocytopenia, unspecified: Secondary | ICD-10-CM | POA: Diagnosis not present

## 2022-07-26 DIAGNOSIS — Z515 Encounter for palliative care: Secondary | ICD-10-CM

## 2022-07-26 DIAGNOSIS — Z950 Presence of cardiac pacemaker: Secondary | ICD-10-CM | POA: Diagnosis present

## 2022-07-26 DIAGNOSIS — I252 Old myocardial infarction: Secondary | ICD-10-CM

## 2022-07-26 DIAGNOSIS — Z79899 Other long term (current) drug therapy: Secondary | ICD-10-CM

## 2022-07-26 DIAGNOSIS — N189 Chronic kidney disease, unspecified: Secondary | ICD-10-CM | POA: Diagnosis present

## 2022-07-26 DIAGNOSIS — J811 Chronic pulmonary edema: Secondary | ICD-10-CM | POA: Diagnosis not present

## 2022-07-26 DIAGNOSIS — Z91030 Bee allergy status: Secondary | ICD-10-CM

## 2022-07-26 DIAGNOSIS — I953 Hypotension of hemodialysis: Secondary | ICD-10-CM | POA: Diagnosis not present

## 2022-07-26 DIAGNOSIS — N2581 Secondary hyperparathyroidism of renal origin: Secondary | ICD-10-CM | POA: Diagnosis present

## 2022-07-26 DIAGNOSIS — I5023 Acute on chronic systolic (congestive) heart failure: Secondary | ICD-10-CM | POA: Diagnosis not present

## 2022-07-26 DIAGNOSIS — Z9842 Cataract extraction status, left eye: Secondary | ICD-10-CM

## 2022-07-26 DIAGNOSIS — E785 Hyperlipidemia, unspecified: Secondary | ICD-10-CM | POA: Diagnosis present

## 2022-07-26 DIAGNOSIS — D631 Anemia in chronic kidney disease: Secondary | ICD-10-CM | POA: Diagnosis present

## 2022-07-26 DIAGNOSIS — Z974 Presence of external hearing-aid: Secondary | ICD-10-CM

## 2022-07-26 DIAGNOSIS — R5383 Other fatigue: Secondary | ICD-10-CM | POA: Diagnosis not present

## 2022-07-26 DIAGNOSIS — I5043 Acute on chronic combined systolic (congestive) and diastolic (congestive) heart failure: Secondary | ICD-10-CM

## 2022-07-26 DIAGNOSIS — R0603 Acute respiratory distress: Secondary | ICD-10-CM

## 2022-07-26 DIAGNOSIS — Z9049 Acquired absence of other specified parts of digestive tract: Secondary | ICD-10-CM

## 2022-07-26 DIAGNOSIS — Z79890 Hormone replacement therapy (postmenopausal): Secondary | ICD-10-CM

## 2022-07-26 DIAGNOSIS — K219 Gastro-esophageal reflux disease without esophagitis: Secondary | ICD-10-CM | POA: Diagnosis present

## 2022-07-26 DIAGNOSIS — I9589 Other hypotension: Secondary | ICD-10-CM | POA: Diagnosis not present

## 2022-07-26 DIAGNOSIS — E1142 Type 2 diabetes mellitus with diabetic polyneuropathy: Secondary | ICD-10-CM | POA: Diagnosis not present

## 2022-07-26 DIAGNOSIS — R410 Disorientation, unspecified: Secondary | ICD-10-CM | POA: Diagnosis not present

## 2022-07-26 DIAGNOSIS — I2489 Other forms of acute ischemic heart disease: Secondary | ICD-10-CM | POA: Diagnosis present

## 2022-07-26 DIAGNOSIS — I442 Atrioventricular block, complete: Secondary | ICD-10-CM | POA: Diagnosis not present

## 2022-07-26 DIAGNOSIS — E669 Obesity, unspecified: Secondary | ICD-10-CM | POA: Diagnosis present

## 2022-07-26 DIAGNOSIS — H9193 Unspecified hearing loss, bilateral: Secondary | ICD-10-CM | POA: Diagnosis present

## 2022-07-26 DIAGNOSIS — J9811 Atelectasis: Secondary | ICD-10-CM | POA: Diagnosis not present

## 2022-07-26 DIAGNOSIS — Z7401 Bed confinement status: Secondary | ICD-10-CM | POA: Diagnosis not present

## 2022-07-26 DIAGNOSIS — G4730 Sleep apnea, unspecified: Secondary | ICD-10-CM | POA: Diagnosis present

## 2022-07-26 DIAGNOSIS — E8770 Fluid overload, unspecified: Secondary | ICD-10-CM | POA: Diagnosis present

## 2022-07-26 DIAGNOSIS — Z7901 Long term (current) use of anticoagulants: Secondary | ICD-10-CM

## 2022-07-26 DIAGNOSIS — Z7982 Long term (current) use of aspirin: Secondary | ICD-10-CM

## 2022-07-26 DIAGNOSIS — D649 Anemia, unspecified: Secondary | ICD-10-CM | POA: Diagnosis not present

## 2022-07-26 DIAGNOSIS — M109 Gout, unspecified: Secondary | ICD-10-CM | POA: Diagnosis present

## 2022-07-26 DIAGNOSIS — R0609 Other forms of dyspnea: Secondary | ICD-10-CM | POA: Diagnosis present

## 2022-07-26 DIAGNOSIS — I132 Hypertensive heart and chronic kidney disease with heart failure and with stage 5 chronic kidney disease, or end stage renal disease: Secondary | ICD-10-CM | POA: Diagnosis not present

## 2022-07-26 DIAGNOSIS — J449 Chronic obstructive pulmonary disease, unspecified: Secondary | ICD-10-CM | POA: Diagnosis not present

## 2022-07-26 DIAGNOSIS — Z1152 Encounter for screening for COVID-19: Secondary | ICD-10-CM | POA: Diagnosis not present

## 2022-07-26 DIAGNOSIS — F32A Depression, unspecified: Secondary | ICD-10-CM | POA: Diagnosis present

## 2022-07-26 DIAGNOSIS — J90 Pleural effusion, not elsewhere classified: Secondary | ICD-10-CM | POA: Diagnosis not present

## 2022-07-26 DIAGNOSIS — I959 Hypotension, unspecified: Secondary | ICD-10-CM | POA: Diagnosis not present

## 2022-07-26 DIAGNOSIS — F3342 Major depressive disorder, recurrent, in full remission: Secondary | ICD-10-CM | POA: Diagnosis present

## 2022-07-26 DIAGNOSIS — E1122 Type 2 diabetes mellitus with diabetic chronic kidney disease: Secondary | ICD-10-CM | POA: Diagnosis present

## 2022-07-26 DIAGNOSIS — R54 Age-related physical debility: Secondary | ICD-10-CM | POA: Diagnosis present

## 2022-07-26 DIAGNOSIS — F419 Anxiety disorder, unspecified: Secondary | ICD-10-CM | POA: Diagnosis present

## 2022-07-26 DIAGNOSIS — J9690 Respiratory failure, unspecified, unspecified whether with hypoxia or hypercapnia: Secondary | ICD-10-CM | POA: Diagnosis not present

## 2022-07-26 DIAGNOSIS — Z888 Allergy status to other drugs, medicaments and biological substances status: Secondary | ICD-10-CM

## 2022-07-26 DIAGNOSIS — E119 Type 2 diabetes mellitus without complications: Secondary | ICD-10-CM

## 2022-07-26 DIAGNOSIS — Z9841 Cataract extraction status, right eye: Secondary | ICD-10-CM

## 2022-07-26 DIAGNOSIS — Z66 Do not resuscitate: Secondary | ICD-10-CM | POA: Diagnosis not present

## 2022-07-26 DIAGNOSIS — R0600 Dyspnea, unspecified: Secondary | ICD-10-CM | POA: Diagnosis not present

## 2022-07-26 DIAGNOSIS — I10 Essential (primary) hypertension: Secondary | ICD-10-CM | POA: Diagnosis present

## 2022-07-26 DIAGNOSIS — Z7902 Long term (current) use of antithrombotics/antiplatelets: Secondary | ICD-10-CM

## 2022-07-26 DIAGNOSIS — I4891 Unspecified atrial fibrillation: Secondary | ICD-10-CM | POA: Diagnosis not present

## 2022-07-26 DIAGNOSIS — Z6831 Body mass index (BMI) 31.0-31.9, adult: Secondary | ICD-10-CM

## 2022-07-26 DIAGNOSIS — Z806 Family history of leukemia: Secondary | ICD-10-CM

## 2022-07-26 DIAGNOSIS — I110 Hypertensive heart disease with heart failure: Secondary | ICD-10-CM | POA: Diagnosis not present

## 2022-07-26 DIAGNOSIS — R0602 Shortness of breath: Secondary | ICD-10-CM | POA: Diagnosis not present

## 2022-07-26 DIAGNOSIS — R1312 Dysphagia, oropharyngeal phase: Secondary | ICD-10-CM | POA: Diagnosis present

## 2022-07-26 DIAGNOSIS — Z955 Presence of coronary angioplasty implant and graft: Secondary | ICD-10-CM

## 2022-07-26 DIAGNOSIS — J810 Acute pulmonary edema: Secondary | ICD-10-CM | POA: Diagnosis not present

## 2022-07-26 DIAGNOSIS — R351 Nocturia: Secondary | ICD-10-CM | POA: Diagnosis present

## 2022-07-26 DIAGNOSIS — R0902 Hypoxemia: Secondary | ICD-10-CM | POA: Diagnosis not present

## 2022-07-26 DIAGNOSIS — Z8249 Family history of ischemic heart disease and other diseases of the circulatory system: Secondary | ICD-10-CM

## 2022-07-26 LAB — I-STAT VENOUS BLOOD GAS, ED
Acid-Base Excess: 6 mmol/L — ABNORMAL HIGH (ref 0.0–2.0)
Acid-Base Excess: 6 mmol/L — ABNORMAL HIGH (ref 0.0–2.0)
Bicarbonate: 31.8 mmol/L — ABNORMAL HIGH (ref 20.0–28.0)
Bicarbonate: 31.7 mmol/L — ABNORMAL HIGH (ref 20.0–28.0)
Calcium, Ion: 1.16 mmol/L (ref 1.15–1.40)
Calcium, Ion: 1.16 mmol/L (ref 1.15–1.40)
HCT: 24 % — ABNORMAL LOW (ref 39.0–52.0)
HCT: 25 % — ABNORMAL LOW (ref 39.0–52.0)
Hemoglobin: 8.2 g/dL — ABNORMAL LOW (ref 13.0–17.0)
Hemoglobin: 8.5 g/dL — ABNORMAL LOW (ref 13.0–17.0)
O2 Saturation: 92 %
O2 Saturation: 76 %
Potassium: 4.1 mmol/L (ref 3.5–5.1)
Potassium: 4.3 mmol/L (ref 3.5–5.1)
Sodium: 137 mmol/L (ref 135–145)
Sodium: 137 mmol/L (ref 135–145)
TCO2: 33 mmol/L — ABNORMAL HIGH (ref 22–32)
TCO2: 33 mmol/L — ABNORMAL HIGH (ref 22–32)
pCO2, Ven: 55.5 mmHg (ref 44–60)
pCO2, Ven: 54.1 mmHg (ref 44–60)
pH, Ven: 7.365 (ref 7.25–7.43)
pH, Ven: 7.377 (ref 7.25–7.43)
pO2, Ven: 69 mmHg — ABNORMAL HIGH (ref 32–45)
pO2, Ven: 42 mmHg (ref 32–45)

## 2022-07-26 LAB — CBC WITH DIFFERENTIAL/PLATELET
Abs Immature Granulocytes: 0.05 10*3/uL (ref 0.00–0.07)
Basophils Absolute: 0 10*3/uL (ref 0.0–0.1)
Basophils Relative: 0 %
Eosinophils Absolute: 0.2 10*3/uL (ref 0.0–0.5)
Eosinophils Relative: 1 %
HCT: 24.1 % — ABNORMAL LOW (ref 39.0–52.0)
Hemoglobin: 7.7 g/dL — ABNORMAL LOW (ref 13.0–17.0)
Immature Granulocytes: 0 %
Lymphocytes Relative: 14 %
Lymphs Abs: 1.9 10*3/uL (ref 0.7–4.0)
MCH: 33 pg (ref 26.0–34.0)
MCHC: 32 g/dL (ref 30.0–36.0)
MCV: 103.4 fL — ABNORMAL HIGH (ref 80.0–100.0)
Monocytes Absolute: 1 10*3/uL (ref 0.1–1.0)
Monocytes Relative: 7 %
Neutro Abs: 11 10*3/uL — ABNORMAL HIGH (ref 1.7–7.7)
Neutrophils Relative %: 78 %
Platelets: 179 10*3/uL (ref 150–400)
RBC: 2.33 MIL/uL — ABNORMAL LOW (ref 4.22–5.81)
RDW: 17.2 % — ABNORMAL HIGH (ref 11.5–15.5)
WBC: 14.2 10*3/uL — ABNORMAL HIGH (ref 4.0–10.5)
nRBC: 0 % (ref 0.0–0.2)

## 2022-07-26 LAB — RESP PANEL BY RT-PCR (RSV, FLU A&B, COVID)  RVPGX2
Influenza A by PCR: NEGATIVE
Influenza B by PCR: NEGATIVE
Resp Syncytial Virus by PCR: NEGATIVE
SARS Coronavirus 2 by RT PCR: NEGATIVE

## 2022-07-26 LAB — BASIC METABOLIC PANEL
Anion gap: 12 (ref 5–15)
BUN: 31 mg/dL — ABNORMAL HIGH (ref 8–23)
CO2: 30 mmol/L (ref 22–32)
Calcium: 8.9 mg/dL (ref 8.9–10.3)
Chloride: 93 mmol/L — ABNORMAL LOW (ref 98–111)
Creatinine, Ser: 5.96 mg/dL — ABNORMAL HIGH (ref 0.61–1.24)
GFR, Estimated: 8 mL/min — ABNORMAL LOW (ref >60)
Glucose, Bld: 163 mg/dL — ABNORMAL HIGH (ref 70–99)
Potassium: 4.1 mmol/L (ref 3.5–5.1)
Sodium: 135 mmol/L (ref 135–145)

## 2022-07-26 LAB — LACTIC ACID, PLASMA
Lactic Acid, Venous: 2.8 mmol/L (ref 0.5–1.9)
Lactic Acid, Venous: 2.2 mmol/L (ref 0.5–1.9)

## 2022-07-26 LAB — TROPONIN I (HIGH SENSITIVITY)
Troponin I (High Sensitivity): 1379 ng/L (ref <18)
Troponin I (High Sensitivity): 1334 ng/L (ref <18)

## 2022-07-26 LAB — MAGNESIUM: Magnesium: 2.1 mg/dL (ref 1.7–2.4)

## 2022-07-26 LAB — BRAIN NATRIURETIC PEPTIDE: B Natriuretic Peptide: 1410.5 pg/mL — ABNORMAL HIGH (ref 0.0–100.0)

## 2022-07-26 LAB — D-DIMER, QUANTITATIVE: D-Dimer, Quant: 1.2 ug/mL-FEU — ABNORMAL HIGH (ref 0.00–0.50)

## 2022-07-26 MED ORDER — SENNOSIDES-DOCUSATE SODIUM 8.6-50 MG PO TABS: 1.0000 | ORAL_TABLET | Freq: Every evening | ORAL | Status: DC | PRN

## 2022-07-26 MED ORDER — HEPARIN SODIUM (PORCINE) 5000 UNIT/ML IJ SOLN
5000.0000 [IU] | Freq: Three times a day (TID) | INTRAMUSCULAR | Status: DC
  Administered 2022-07-27 – 2022-07-30 (×10): 5000 [IU] via SUBCUTANEOUS
  Filled 2022-07-26 (×10): qty 1

## 2022-07-26 MED ORDER — SEVELAMER CARBONATE 800 MG PO TABS
1600.0000 mg | ORAL_TABLET | Freq: Three times a day (TID) | ORAL | Status: DC
  Administered 2022-07-27 – 2022-07-31 (×13): 1600 mg via ORAL
  Filled 2022-07-26 (×13): qty 2

## 2022-07-26 MED ORDER — ONDANSETRON HCL 4 MG PO TABS: 4.0000 mg | ORAL_TABLET | Freq: Four times a day (QID) | ORAL | Status: DC | PRN

## 2022-07-26 MED ORDER — MELATONIN 3 MG PO TABS
3.0000 mg | ORAL_TABLET | Freq: Every day | ORAL | Status: DC
  Administered 2022-07-27 – 2022-07-30 (×4): 3 mg via ORAL
  Filled 2022-07-26 (×4): qty 1

## 2022-07-26 MED ORDER — NITROGLYCERIN 0.4 MG SL SUBL: 0.4000 mg | SUBLINGUAL_TABLET | SUBLINGUAL | Status: DC | PRN

## 2022-07-26 MED ORDER — SODIUM CHLORIDE 0.9 % IV SOLN
6.2500 mg | Freq: Four times a day (QID) | INTRAVENOUS | Status: DC | PRN
  Administered 2022-07-28: 6.25 mg via INTRAVENOUS
  Filled 2022-07-26 (×2): qty 0.25

## 2022-07-26 MED ORDER — ONDANSETRON HCL 4 MG/2ML IJ SOLN: 4.0000 mg | Freq: Four times a day (QID) | INTRAMUSCULAR | Status: DC | PRN

## 2022-07-26 MED ORDER — ISOSORBIDE MONONITRATE ER 30 MG PO TB24
15.0000 mg | ORAL_TABLET | Freq: Every day | ORAL | Status: DC
  Administered 2022-07-28: 15 mg via ORAL
  Filled 2022-07-26 (×2): qty 1

## 2022-07-26 MED ORDER — HYDROCODONE-ACETAMINOPHEN 5-325 MG PO TABS: 1.0000 | ORAL_TABLET | Freq: Four times a day (QID) | ORAL | Status: DC | PRN

## 2022-07-26 MED ORDER — PANTOPRAZOLE SODIUM 40 MG PO TBEC
40.0000 mg | DELAYED_RELEASE_TABLET | Freq: Every day | ORAL | Status: DC
  Administered 2022-07-27 – 2022-07-31 (×6): 40 mg via ORAL
  Filled 2022-07-26 (×5): qty 1

## 2022-07-26 MED ORDER — ASPIRIN 81 MG PO TBEC
81.0000 mg | DELAYED_RELEASE_TABLET | Freq: Every day | ORAL | Status: DC
  Administered 2022-07-27 – 2022-07-31 (×5): 81 mg via ORAL
  Filled 2022-07-26 (×5): qty 1

## 2022-07-26 MED ORDER — EZETIMIBE 10 MG PO TABS
10.0000 mg | ORAL_TABLET | Freq: Every day | ORAL | Status: DC
  Administered 2022-07-27 – 2022-07-31 (×5): 10 mg via ORAL
  Filled 2022-07-26 (×4): qty 1

## 2022-07-26 MED ORDER — FENTANYL CITRATE PF 50 MCG/ML IJ SOSY
25.0000 ug | PREFILLED_SYRINGE | Freq: Once | INTRAMUSCULAR | Status: AC
  Administered 2022-07-26: 25 ug via INTRAVENOUS
  Filled 2022-07-26: qty 1

## 2022-07-26 MED ORDER — MIDODRINE HCL 5 MG PO TABS
5.0000 mg | ORAL_TABLET | Freq: Once | ORAL | Status: AC
  Administered 2022-07-26: 5 mg via ORAL
  Filled 2022-07-26: qty 1

## 2022-07-26 MED ORDER — VITAMIN B-12 1000 MCG PO TABS
1000.0000 ug | ORAL_TABLET | Freq: Every day | ORAL | Status: DC
  Administered 2022-07-27 – 2022-07-31 (×5): 1000 ug via ORAL
  Filled 2022-07-26 (×5): qty 1

## 2022-07-26 MED ORDER — ATORVASTATIN CALCIUM 80 MG PO TABS
80.0000 mg | ORAL_TABLET | Freq: Every day | ORAL | Status: DC
  Administered 2022-07-27 – 2022-07-30 (×4): 80 mg via ORAL
  Filled 2022-07-26 (×4): qty 1

## 2022-07-26 MED ORDER — TICAGRELOR 90 MG PO TABS
90.0000 mg | ORAL_TABLET | Freq: Two times a day (BID) | ORAL | Status: DC
  Administered 2022-07-27 – 2022-07-31 (×9): 90 mg via ORAL
  Filled 2022-07-26 (×9): qty 1

## 2022-07-26 MED ORDER — CARVEDILOL 3.125 MG PO TABS
3.1250 mg | ORAL_TABLET | Freq: Two times a day (BID) | ORAL | Status: DC
  Filled 2022-07-26: qty 1

## 2022-07-26 MED ORDER — IOHEXOL 350 MG/ML SOLN
75.0000 mL | Freq: Once | INTRAVENOUS | Status: AC | PRN
  Administered 2022-07-26: 75 mL via INTRAVENOUS

## 2022-07-26 MED ORDER — POLYETHYLENE GLYCOL 3350 17 G PO PACK
17.0000 g | PACK | Freq: Every day | ORAL | Status: DC
  Administered 2022-07-28 – 2022-07-31 (×4): 17 g via ORAL
  Filled 2022-07-26 (×3): qty 1

## 2022-07-26 MED ORDER — HEPARIN SODIUM (PORCINE) 1000 UNIT/ML DIALYSIS: 2500.0000 [IU] | INTRAMUSCULAR | Status: DC | PRN

## 2022-07-26 MED ORDER — MIDODRINE HCL 5 MG PO TABS: 5.0000 mg | ORAL_TABLET | Freq: Three times a day (TID) | ORAL | Status: DC

## 2022-07-26 MED ORDER — ACETAMINOPHEN 650 MG RE SUPP: 650.0000 mg | Freq: Four times a day (QID) | RECTAL | Status: DC | PRN

## 2022-07-26 MED ORDER — SACCHAROMYCES BOULARDII 250 MG PO CAPS
250.0000 mg | ORAL_CAPSULE | Freq: Two times a day (BID) | ORAL | Status: DC
  Administered 2022-07-27 – 2022-07-31 (×8): 250 mg via ORAL
  Filled 2022-07-26 (×10): qty 1

## 2022-07-26 MED ORDER — CALCITRIOL 0.5 MCG PO CAPS: 1.7500 ug | ORAL_CAPSULE | ORAL | Status: DC

## 2022-07-26 MED ORDER — ACETAMINOPHEN 325 MG PO TABS
650.0000 mg | ORAL_TABLET | Freq: Four times a day (QID) | ORAL | Status: DC | PRN
  Administered 2022-07-30 – 2022-07-31 (×2): 650 mg via ORAL
  Filled 2022-07-26 (×2): qty 2

## 2022-07-26 MED ORDER — DOCUSATE SODIUM 100 MG PO CAPS
100.0000 mg | ORAL_CAPSULE | Freq: Two times a day (BID) | ORAL | Status: DC
  Administered 2022-07-27 – 2022-07-31 (×9): 100 mg via ORAL
  Filled 2022-07-26 (×9): qty 1

## 2022-07-26 MED ORDER — CHLORHEXIDINE GLUCONATE CLOTH 2 % EX PADS
6.0000 | MEDICATED_PAD | Freq: Every day | CUTANEOUS | Status: DC
  Administered 2022-07-27 – 2022-07-31 (×5): 6 via TOPICAL

## 2022-07-26 MED ORDER — GABAPENTIN 100 MG PO CAPS
100.0000 mg | ORAL_CAPSULE | Freq: Every day | ORAL | Status: DC
  Administered 2022-07-27 – 2022-07-30 (×4): 100 mg via ORAL
  Filled 2022-07-26 (×4): qty 1

## 2022-07-26 MED ORDER — MIDODRINE HCL 5 MG PO TABS
10.0000 mg | ORAL_TABLET | ORAL | Status: DC
  Administered 2022-07-28 – 2022-07-31 (×3): 10 mg via ORAL
  Filled 2022-07-26 (×4): qty 2

## 2022-07-26 MED ORDER — POLYVINYL ALCOHOL 1.4 % OP SOLN
2.0000 [drp] | Freq: Three times a day (TID) | OPHTHALMIC | Status: DC
  Administered 2022-07-27 – 2022-07-31 (×12): 2 [drp] via OPHTHALMIC
  Filled 2022-07-26: qty 15

## 2022-07-26 MED ORDER — RENA-VITE PO TABS
1.0000 | ORAL_TABLET | Freq: Every day | ORAL | Status: DC
  Administered 2022-07-27 – 2022-07-30 (×4): 1 via ORAL
  Filled 2022-07-26 (×5): qty 1

## 2022-07-26 NOTE — ED Provider Notes (Signed)
[facility] EMERGENCY DEPARTMENT Provider Note   CSN: [id number] Arrival date & time: 07/26/22  1437     History {Add pertinent medical, surgical, social history, OB history to HPI:1} Chief Complaint  Patient presents with   Shortness of Breath    Rodney Farley is a 89 y.o. male with a history of here today, COPD, T2DM, ischemic cardiomyopathy, chronic systolic heart failure, chronic hypotension on midodrine, HTN, OSA, obesity, A-fib and third-degree block pacemaker/ICD, status post cholecystectomy, GERD, diverticulitis, gout, history of TIA who presents with shortness of breath.   This morning patient had acute onset shortness of breath with chest pain with inspiration. Patient reports he is trying to take shallow breaths to avoid the pain. Patient reports no wheezing, coming from nursing home where there has been a lot of covid and influenza reported. Patient reports lower extremity edema and neuropathy that is symmetric and at his baseline. Patient was admitted from 05/28/22-06/09/22 for traumatic fracture/dislocation of L ankle s/p ORIF on 11/20, placed in immobilization still now. Last dialysis was Friday and got a full session. He had 3L removed which was more than normal, and he had a lot of RLE cramping as a result. He does not take any blood thinners. Has h/o CAD w/ multiple PCIs and Mis but states this does not feel like those. No cough or fevers/chills that he is aware of, no nausea/vomiting, urinary symptoms. Patient states right now he feels sleepy and he can control the pain in his chest by taking shallow breaths.  Patient confirmed for me that he is DNR, would not like to be intubated.  I explained to patient that he is in respiratory distress and that this is something we would not potentially recommend if he gets worse and he reconfirmed that he would not like to be intubated.  He states his wife is his HCPOA and we can discuss anything with her as  well.   Shortness of Breath      Home Medications Prior to Admission medications   Medication Sig Start Date End Date Taking? Authorizing Provider  acetaminophen (TYLENOL) 500 MG tablet Take 500 mg by mouth as needed for moderate pain.    [provider]  aspirin EC 81 MG tablet Take 81 mg by mouth daily.    [provider]  atorvastatin (LIPITOR) 80 MG tablet Take 1 tablet by mouth daily. Patient taking differently: Take 80 mg by mouth at bedtime. 11/14/21   Margie Billet, PA-C  B Complex-C-Folic Acid (NEPHRO VITAMINS) 0.8 MG TABS Take 1 tablet by mouth daily.    [provider]  calcitRIOL (ROCALTROL) 0.25 MCG capsule Take 7 capsules (1.75 mcg total) by mouth every Monday, Wednesday, and Friday with hemodialysis. Patient taking differently: Take by mouth every Monday, Wednesday, and Friday with hemodialysis. 01/26/22   [name], Altus M, MD  carbamide peroxide (DEBROX) 6.5 % OTIC solution Place 5 drops into both ears 2 (two) times daily as needed (earwax). Patient taking differently: Place 5 drops into both ears as needed (earwax). 11/13/21   Lelon Perla, MD  Carboxymethylcellul-Glycerin (LUBRICATING EYE DROPS OP) Place 2-3 drops into both eyes in the morning, at noon, and at bedtime.    [provider]  carvedilol (COREG) 3.125 MG tablet Take 1 tablet (3.125 mg total) by mouth 2 (two) times daily with a meal. Hold for SBP <100 mmhg OR hr < 60/min Patient taking differently: Take 3.125 mg by mouth See admin instructions. Take 3.125  twice daily on Tuesday, Thursday, Saturday, and Sunday. Take 3.125 mg in the evening on Monday, Wednesday, Friday. 05/08/22 06/07/22  Antonieta Pert, MD  docusate sodium (COLACE) 100 MG capsule Take 1 capsule (100 mg total) by mouth 2 (two) times daily. 06/09/22   Barb Merino, MD  Evolocumab (REPATHA SURECLICK) 272 MG/ML SOAJ Inject 140 mg into the skin every 14 (fourteen) days. 04/22/22   [name], Kyre M, MD  ezetimibe  (ZETIA) 10 MG tablet Take 1 tablet (10 mg total) by mouth daily. 05/08/22   Antonieta Pert, MD  gabapentin (NEURONTIN) 100 MG capsule Take 1 capsule (100 mg total) by mouth at bedtime. 06/09/22   Barb Merino, MD  glucose blood (FREESTYLE TEST STRIPS) test strip Use to check blood sugar daily 05/01/19   [name], MD  glucose monitoring kit (FREESTYLE) monitoring kit USE TO MONITOR BLOOD GLUCOSE AS DIRECTED 05/01/19   [name], MD  HYDROcodone-acetaminophen (NORCO/VICODIN) 5-325 MG tablet Take 1 tablet by mouth every 6 (six) hours as needed for severe pain (postoperative pain). 06/03/22 06/03/23  Georgeanna Harrison, MD  isosorbide mononitrate (IMDUR) 30 MG 24 hr tablet Take 1/2 tablet (15 mg total) by mouth daily. 05/09/22 06/08/22  Antonieta Pert, MD  melatonin 3 MG TABS tablet Take 1 tablet (3 mg total) by mouth at bedtime. 06/09/22   Barb Merino, MD  midodrine (PROAMATINE) 10 MG tablet Take 0.5-1 tablets (5-10 mg total) by mouth See admin instructions. Take 1 tablet (10 mg) three times a week. Add 5 mg a night after dialysis as needed for low blood pressure. Patient taking differently: Take 5-10 mg by mouth See admin instructions. Take 1 tablet (10 mg) three times a week on dialysis days. May take 5 mg a night after dialysis as needed for low blood pressure. 04/02/22   Aline August, MD  multivitamin (RENA-VIT) TABS tablet Take 1 tablet by mouth daily. 08/22/21   [name], MD  nitroGLYCERIN (NITROSTAT) 0.4 MG SL tablet Place 1 tablet under the tongue every 5 minutes x 3 doses as needed for chest pain. 11/13/21   Margie Billet, PA-C  oxymetazoline (AFRIN) 0.05 % nasal spray Place 1 spray into both nostrils 2 (two) times daily as needed for congestion. Patient taking differently: Place 3-4 sprays into both nostrils as needed (nose bleeds). 12/29/21   [name], MD  pantoprazole (PROTONIX) 40 MG tablet Take 1 tablet by mouth daily. 11/14/21   Margie Billet, PA-C   saccharomyces boulardii (FLORASTOR) 250 MG capsule Take 1 capsule (250 mg total) by mouth 2 (two) times daily. Patient taking differently: Take 250 mg by mouth every evening. 04/08/19   Regalado, Belkys A, MD  sevelamer carbonate (RENVELA) 800 MG tablet Take 2 tablets (1,600 mg total) by mouth 3 (three) times daily with meals. Patient taking differently: Take 800-2,400 mg by mouth See admin instructions. Take 2400 mg  by mouth twice a day. Take [phone] mg by mouth with snacks. 11/13/21   Lelon Perla, MD  ticagrelor (BRILINTA) 90 MG TABS tablet Take 1 tablet (90 mg total) by mouth 2 (two) times daily. 05/08/22   Antonieta Pert, MD  vitamin B-12 (CYANOCOBALAMIN) 1000 MCG tablet Take 1,000 mcg by mouth daily.    [provider]      Allergies    Bee venom, Lyrica [pregabalin], Prednisone, and Zocor [simvastatin]    Review of Systems   Review of Systems  Respiratory:  Positive for shortness of breath.    Review  of systems Negative for f/c.  A 10 point review of systems was performed and is negative unless otherwise reported in HPI.  Physical Exam Updated Vital Signs Ht 5\' 7"  (1.702 m)   Wt 90.7 kg   BMI 31.32 kg/m  Physical Exam General: Chronically ill-appearing elderly male, lying in bed.  HEENT: PERRLA, Sclera anicteric, MMM, trachea midline.  Cardiology: RRR, no murmurs/rubs/gallops. BL radial and DP pulses equal bilaterally.  Resp: Normal respiratory rate and effort. CTAB, no wheezes, rhonchi, crackles.  Abd: Soft, non-tender, non-distended. No rebound tenderness or guarding.  GU: Deferred. MSK: No peripheral edema or signs of trauma. Extremities without deformity or TTP. No cyanosis or clubbing. Skin: warm, dry. No rashes or lesions. Back: No CVA tenderness Neuro: A&Ox4, CNs II-XII grossly intact. MAEs. Sensation grossly intact.  Psych: Normal mood and affect.   ED Results / Procedures / Treatments   Labs (all labs ordered are listed, but only abnormal results are  displayed) Labs Reviewed  RESP PANEL BY RT-PCR (RSV, FLU A&B, COVID)  [id number]    EKG None  Radiology No results found.  Procedures Procedures  {Document cardiac monitor, telemetry assessment procedure when appropriate:1}    EMERGENCY DEPARTMENT [place] CARDIAC EXAM "Study: Limited Ultrasound of the Heart and Pericardium"  INDICATIONS:Abnormal vital signs, Chest pain, and Dyspnea Multiple views of the heart and pericardium were obtained in real-time with a multi-frequency probe.  PERFORMED QM:GNOIBB IMAGES ARCHIVED?: No LIMITATIONS:  Body habitus and Emergent procedure VIEWS USED: Parasternal long axis, Parasternal short axis, and Apical 4 chamber  INTERPRETATION: Cardiac activity present, Pericardial effusioin absent, Cardiac tamponade absent, Probable low CVP, Decreased contractility, and wall motion abnormalities present, concern for D-sign on parasternal short axis but difficult views to obtain    Medications Ordered in ED Medications - No data to display  ED Course/ Medical Decision Making/ A&P                          Medical Decision Making Amount and/or Complexity of Data Reviewed Labs: ordered. Decision-making details documented in ED Course. Radiology: ordered. Decision-making details documented in ED Course.  Risk Prescription drug management.    This patient presents to the ED for concern of SOB, CP, this involves an extensive number of treatment options, and is a complaint that carries with it a high risk of complications and morbidity.  I considered the following differential and admission for this acute, potentially life threatening condition.   MDM:    *** DDX for dyspnea includes but is not limited to: Cardiac- CHF, Myocardial Ischemia, Valvular heart disease, Arrythmia, Cardiac tamponade  Respiratory - Pneumonia / atelectasis / pulmonary effusion / cavitary lung disease, Pneumothorax, COPD/ reactive airway disease, PE, ARDS  Other - Sepsis, Anemia     Patient with hypotension to 80s/60s w/ MAP 65-66. He is instructed to take large breaths despite the pain as there is concern for hypercarbia/inadequate ventilation. BiPAP 15/5 pulling tidal volumes 400-600 cc. Patient's BS echo demonstrates difficult views and wall motion abnormalities, likely result of prior MIs , but concern for D-sign on parasternal short axis. In context of dyspnea, pleuritic chest pain, recent hospitalization/surgery/immobilization, high c/f PE and will obtain CT scan. Pt states he can lie flat for it. Also consider covid/flu given report of exposure at SNF. Consider ACS/arrhythmia, EKG/telemetry shows AV paced rhythm, will obtain trops. Consider pneumonia or fluid overload with crackles heard on R lung base, will obtain CXR.   Clinical Course as  of 07/26/22 1626  Sun Jul 26, 2022  1607 DG Chest [place] 1 View IMPRESSION: 1. Mild congestive heart failure. 2. Small bilateral pleural effusions, right greater than left. 3. Hazy bibasilar lung opacities, favor atelectasis.   [HN]  1623 pCO2, Ven: 55.5 Not hypercarbic [HN]  1623 WBC(!): 14.2 Leukocytosis [HN]  1623 Hemoglobin(!): 7.7 New down from BL 9-11.5 [HN]  1624 CT Angio Chest PE W and/or Wo Contrast 1. Bilateral pleural effusions, both moderate to large in size, RIGHT slightly greater than LEFT, with associated compressive atelectasis. 2. No pulmonary embolism identified, with mild study limitations detailed above. 3. Diffuse coronary artery calcifications, particularly dense within the LEFT main and LEFT anterior descending coronary artery.   [HN]    Clinical Course User Index [HN] Audley Hose, MD    Labs: I Ordered, and personally interpreted labs.  The pertinent results include:  ***  Imaging Studies ordered: I ordered imaging studies including CXR, CTPE I independently visualized and interpreted imaging. I agree with the radiologist interpretation  Additional history obtained from patient,  chart review, and daughter-in-law at bedside.    Cardiac Monitoring: The patient was maintained on a cardiac monitor.  I personally viewed and interpreted the cardiac monitored which showed an underlying rhythm of: paced rhythm  Reevaluation: After the interventions noted above, I reevaluated the patient and found that they have :{resolved/improved/worsened:23923::"improved"}  Social Determinants of Health: Lives in SNF  Disposition:  ***  Co morbidities that complicate the patient evaluation  Past Medical History:  Diagnosis Date   AICD (automatic cardioverter/defibrillator) present    [employer] pacer   Anemia    Anxiety and depression    Arthritis    Bell's palsy    left side. after shingles episode   BPH associated with nocturia    Chronic systolic CHF (congestive heart failure) ([place])    EF normalized by Echo 2019   COPD (chronic obstructive pulmonary disease) ([place])    Severe   Coronary artery disease    a. s/p MI in 1994/1995 while in [place] s/p questionable PCI. 03/2015: progression of disease, for staged PCI.   Diabetic peripheral neuropathy (HCC)    GERD (gastroesophageal reflux disease)    Gout    Hard of hearing    B/L   History of chronic pancreatitis 07/23/2017   noted on CT abd/pelvis   History of shingles    Hypercholesterolemia    Hypertension    Obesity    Sleep apnea    "sleeps w/humidifyer when he panics and gets short of breath" (04/08/2015)   TIA (transient ischemic attack) X 3   Trigger middle finger of left hand    Type II diabetes mellitus ([place])    Wears glasses    Wears hearing aid      Medicines No orders of the defined types were placed in this encounter.   I have reviewed the patients home medicines and have made adjustments as needed  Problem List / ED Course: Problem List Items Addressed This Visit   None        {Document critical care time when appropriate:1} {Document review of labs and clinical decision tools ie heart  score, Chads2Vasc2 etc:1}  {Document your independent review of radiology images, and any outside records:1} {Document your discussion with family members, caretakers, and with consultants:1} {Document social determinants of health affecting pt's care:1} {Document your decision making why or why not admission, treatments were needed:1}  This note was created using dictation software, which may contain  spelling or grammatical errors.

## 2022-07-26 NOTE — Progress Notes (Signed)
Pt placed on Bipap by RT due to increased WOB. Pt tolerating well, vitals stable, RN at bedside,MD at beside, RT will monitor.

## 2022-07-26 NOTE — H&P (Addendum)
PCP:   Shelva Majestic, MD   Chief Complaint:  Shortness of breath  HPI: This 89 year old male with past medical history of anxiety and depression, chronic systolic heart failure, COPD, diabetes mellitus, peripheral neuropathy, hyperlipidemia, hypertension, sleep apnea, AICD.  Patient is a male resident of [address] [facility].  He has ESRD on Friday.  He has not missed hemodialysis session.  Today per patient he woke up with centrally located chest pain that radiated upward to his left shoulder and arm.  He complained of shortness of breath.  He was found to be hypoxic.  Oxygen was applied and he was given DuoNebs, his sats remained at 87%.  In the ER CT positive for bilateral pleural effusions, both moderate to large in size, right slightly greater than left.  EDP contacted cardiologist Dr. Fredderick Severance and nephrologist Dr. Malachi Bonds.  Hemodialysis planned.  Review of Systems:  The patient denies anorexia, fever, weight loss,, vision loss, decreased hearing, hoarseness, chest pain, syncope, dyspnea on exertion, peripheral edema, balance deficits, hemoptysis, abdominal pain, melena, hematochezia, severe indigestion/heartburn, hematuria, incontinence, genital sores, muscle weakness, suspicious skin lesions, transient blindness, difficulty walking, depression, unusual weight change, abnormal bleeding, enlarged lymph nodes, angioedema, and breast masses.  Past Medical History: Past Medical History:  Diagnosis Date   AICD (automatic cardioverter/defibrillator) present    [employer] pacer   Anemia    Anxiety and depression    Arthritis    Bell's palsy    left side. after shingles episode   BPH associated with nocturia    Chronic systolic CHF (congestive heart failure) (HCC)    EF normalized by Echo 2019   COPD (chronic obstructive pulmonary disease) (HCC)    Severe   Coronary artery disease    a. s/p MI in 1994/1995 while in [place] s/p questionable PCI. 03/2015: progression of  disease, for staged PCI.   Diabetic peripheral neuropathy (HCC)    GERD (gastroesophageal reflux disease)    Gout    Hard of hearing    B/L   History of chronic pancreatitis 07/23/2017   noted on CT abd/pelvis   History of shingles    Hypercholesterolemia    Hypertension    Obesity    Sleep apnea    "sleeps w/humidifyer when he panics and gets short of breath" (04/08/2015)   TIA (transient ischemic attack) X 3   Trigger middle finger of left hand    Type II diabetes mellitus (HCC)    Wears glasses    Wears hearing aid    Past Surgical History:  Procedure Laterality Date   APPENDECTOMY     AV FISTULA PLACEMENT Right 02/07/2019   Procedure: ARTERIOVENOUS (AV) FISTULA CREATION RIGHT UPPER ARM;  Surgeon: Maeola Harman, MD;  Location: [facility] OR;  Service: Vascular;  Laterality: Right;   BIV ICD GENERATOR CHANGEOUT N/A 10/09/2021   Procedure: BIV ICD GENERATOR CHANGEOUT;  Surgeon: Duke Salvia, MD;  Location: [facility] INVASIVE CV LAB;  Service: Cardiovascular;  Laterality: N/A;   CARDIAC CATHETERIZATION N/A 03/29/2015   Procedure: Right/Left Heart Cath and Coronary Angiography;  Surgeon: Eliyas M Swaziland, MD;  Location: [facility] INVASIVE CV LAB;  Service: Cardiovascular;  Laterality: N/A;   CARDIAC CATHETERIZATION  1995   "after my MI; put me on heart RX after cath"   CARDIAC CATHETERIZATION N/A 04/09/2015   Procedure: Coronary Stent Intervention;  Surgeon: Deondra M Swaziland, MD;  Location: [facility] INVASIVE CV LAB;  Service: Cardiovascular;  Laterality: N/A;   CATARACT EXTRACTION W/ INTRAOCULAR  LENS  IMPLANT, BILATERAL     CHOLECYSTECTOMY N/A 10/13/2017   Procedure: LAPAROSCOPIC CHOLECYSTECTOMY WITH LYSIS OF ADHESIONS;  Surgeon: Andria Meuse, MD;  Location: [facility] ORS;  Service: General;  Laterality: N/A;   COLONOSCOPY     CORONARY ANGIOGRAPHY N/A 11/12/2021   Procedure: CORONARY ANGIOGRAPHY;  Surgeon: Iran Ouch, MD;  Location: [facility] INVASIVE CV LAB;  Service: Cardiovascular;  Laterality: N/A;    CORONARY ANGIOGRAPHY N/A 12/24/2021   Procedure: CORONARY ANGIOGRAPHY;  Surgeon: Lennette Bihari, MD;  Location: [facility] INVASIVE CV LAB;  Service: Cardiovascular;  Laterality: N/A;   CORONARY ANGIOGRAPHY N/A 03/31/2022   Procedure: CORONARY ANGIOGRAPHY;  Surgeon: Corky Crafts, MD;  Location: [facility] INVASIVE CV LAB;  Service: Cardiovascular;  Laterality: N/A;   CORONARY ANGIOGRAPHY N/A 05/07/2022   Procedure: CORONARY ANGIOGRAPHY;  Surgeon: Corky Crafts, MD;  Location: [facility] INVASIVE CV LAB;  Service: Cardiovascular;  Laterality: N/A;   CORONARY BALLOON ANGIOPLASTY N/A 12/24/2021   Procedure: CORONARY BALLOON ANGIOPLASTY;  Surgeon: Lennette Bihari, MD;  Location: [facility] INVASIVE CV LAB;  Service: Cardiovascular;  Laterality: N/A;   CORONARY BALLOON ANGIOPLASTY N/A 02/09/2022   Procedure: CORONARY BALLOON ANGIOPLASTY;  Surgeon: Yvonne Kendall, MD;  Location: [facility] INVASIVE CV LAB;  Service: Cardiovascular;  Laterality: N/A;  LAD   CORONARY BALLOON ANGIOPLASTY N/A 03/31/2022   Procedure: CORONARY BALLOON ANGIOPLASTY;  Surgeon: Corky Crafts, MD;  [facility]: [facility] CV LAB;  Service: Cardiovascular;  Laterality: N/A;   CORONARY BALLOON ANGIOPLASTY N/A 05/07/2022   Procedure: CORONARY BALLOON ANGIOPLASTY;  Surgeon: Corky Crafts, MD;  [facility]: [facility] CV LAB;  Service: Cardiovascular;  Laterality: N/A;   CORONARY STENT INTERVENTION N/A 11/12/2021   Procedure: CORONARY STENT INTERVENTION;  Surgeon: Iran Ouch, MD;  Location: [facility] CV LAB;  Service: Cardiovascular;  Laterality: N/A;  LAD   DENTAL SURGERY     EP IMPLANTABLE DEVICE N/A 09/23/2015   MDT CRT-D, Dr. Graciela Husbands   HIATAL HERNIA REPAIR  1977   ILEOCECETOMY N/A 03/27/2017   Procedure: ILEOCECECTOMY;  Surgeon: Andria Meuse, MD;  Location: [facility] OR;  Service: General;  Laterality: N/A;   INSERT / REPLACE / REMOVE PACEMAKER  07/2008   Complete heart block status post DDD with good function   INTRAVASCULAR IMAGING/OCT  N/A 12/24/2021   Procedure: INTRAVASCULAR IMAGING/OCT;  Surgeon: Lennette Bihari, MD;  Location: [facility] INVASIVE CV LAB;  Service: Cardiovascular;  Laterality: N/A;   INTRAVASCULAR LITHOTRIPSY  02/09/2022   Procedure: INTRAVASCULAR LITHOTRIPSY;  Surgeon: Yvonne Kendall, MD;  Location: [facility] CV LAB;  Service: Cardiovascular;;  LAD   INTRAVASCULAR ULTRASOUND/IVUS N/A 02/09/2022   Procedure: Intravascular Ultrasound/IVUS;  Surgeon: Yvonne Kendall, MD;  Location: [facility] CV LAB;  Service: Cardiovascular;  Laterality: N/A;  LAD   IR FLUORO GUIDE CV LINE RIGHT  04/03/2019   IR US GUIDE VASC ACCESS RIGHT  04/03/2019   LAPAROTOMY N/A 03/27/2017   Procedure: EXPLORATORY LAPAROTOMY;  Surgeon: Andria Meuse, MD;  Location: [facility] OR;  Service: General;  Laterality: N/A;   ORIF ANKLE FRACTURE Left 06/02/2022   Procedure: OPEN REDUCTION INTERNAL FIXATION (ORIF) LEFT LATERAL MALLEOLUS;  Surgeon: Ernestina Columbia, MD;  Location: [facility] OR;  Service: Orthopedics;  Laterality: Left;   RIGHT HEART CATH AND CORONARY ANGIOGRAPHY N/A 02/09/2022   Procedure: RIGHT HEART CATH AND CORONARY ANGIOGRAPHY;  Surgeon: Yvonne Kendall, MD;  [facility]: [facility];  Service: Cardiovascular;  Laterality: N/A;   TONSILLECTOMY     UPPER GI ENDOSCOPY  Medications: Prior to Admission medications   Medication Sig Start Date End Date Taking? Authorizing Provider  acetaminophen (TYLENOL) 500 MG tablet Take 500 mg by mouth as needed for moderate pain.    [provider]  aspirin EC 81 MG tablet Take 81 mg by mouth daily.    [provider]  atorvastatin (LIPITOR) 80 MG tablet Take 1 tablet by mouth daily. Patient taking differently: Take 80 mg by mouth at bedtime. 11/14/21   Jonita Albee, PA-C  B Complex-C-Folic Acid (NEPHRO VITAMINS) 0.8 MG TABS Take 1 tablet by mouth daily.    [provider]  calcitRIOL (ROCALTROL) 0.25 MCG capsule Take 7 capsules (1.75 mcg total) by mouth every  Monday, Wednesday, and Friday with hemodialysis. Patient taking differently: Take by mouth every Monday, Wednesday, and Friday with hemodialysis. 01/26/22   Swaziland, Kong M, MD  carbamide peroxide (DEBROX) 6.5 % OTIC solution Place 5 drops into both ears 2 (two) times daily as needed (earwax). Patient taking differently: Place 5 drops into both ears as needed (earwax). 11/13/21   Lewayne Bunting, MD  Carboxymethylcellul-Glycerin (LUBRICATING EYE DROPS OP) Place 2-3 drops into both eyes in the morning, at noon, and at bedtime.    [provider]  carvedilol (COREG) 3.125 MG tablet Take 1 tablet (3.125 mg total) by mouth 2 (two) times daily with a meal. Hold for SBP <100 mmhg OR hr < 60/min Patient taking differently: Take 3.125 mg by mouth See admin instructions. Take 3.125 twice daily on Tuesday, Thursday, Saturday, and Sunday. Take 3.125 mg in the evening on Monday, Wednesday, Friday. 05/08/22 06/07/22  Lanae Boast, MD  docusate sodium (COLACE) 100 MG capsule Take 1 capsule (100 mg total) by mouth 2 (two) times daily. 06/09/22   Dorcas Carrow, MD  Evolocumab (REPATHA SURECLICK) 140 MG/ML SOAJ Inject 140 mg into the skin every 14 (fourteen) days. 04/22/22   [place]aziland, Waseem M, MD  ezetimibe (ZETIA) 10 MG tablet Take 1 tablet (10 mg total) by mouth daily. 05/08/22   Lanae Boast, MD  gabapentin (NEURONTIN) 100 MG capsule Take 1 capsule (100 mg total) by mouth at bedtime. 06/09/22   Dorcas Carrow, MD  glucose blood (FREESTYLE TEST STRIPS) test strip Use to check blood sugar daily 05/01/19   Shelva Majestic, MD  glucose monitoring kit (FREESTYLE) monitoring kit USE TO MONITOR BLOOD GLUCOSE AS DIRECTED 05/01/19   Shelva Majestic, MD  HYDROcodone-acetaminophen (NORCO/VICODIN) 5-325 MG tablet Take 1 tablet by mouth every 6 (six) hours as needed for severe pain (postoperative pain). 06/03/22 06/03/23  Ernestina Columbia, MD  isosorbide mononitrate (IMDUR) 30 MG 24 hr tablet Take 1/2 tablet (15 mg total)  by mouth daily. 05/09/22 06/08/22  Lanae Boast, MD  melatonin 3 MG TABS tablet Take 1 tablet (3 mg total) by mouth at bedtime. 06/09/22   Dorcas Carrow, MD  midodrine (PROAMATINE) 10 MG tablet Take 0.5-1 tablets (5-10 mg total) by mouth See admin instructions. Take 1 tablet (10 mg) three times a week. Add 5 mg a night after dialysis as needed for low blood pressure. Patient taking differently: Take 5-10 mg by mouth See admin instructions. Take 1 tablet (10 mg) three times a week on dialysis days. May take 5 mg a night after dialysis as needed for low blood pressure. 04/02/22   Glade Lloyd, MD  multivitamin (RENA-VIT) TABS tablet Take 1 tablet by mouth daily. 08/22/21   Shelva Majestic, MD  nitroGLYCERIN (NITROSTAT) 0.4 MG SL tablet Place 1 tablet  under the tongue every 5 minutes x 3 doses as needed for chest pain. 11/13/21   Jonita Albee, PA-C  oxymetazoline (AFRIN) 0.05 % nasal spray Place 1 spray into both nostrils 2 (two) times daily as needed for congestion. Patient taking differently: Place 3-4 sprays into both nostrils as needed (nose bleeds). 12/29/21   Shelva Majestic, MD  pantoprazole (PROTONIX) 40 MG tablet Take 1 tablet by mouth daily. 11/14/21   Jonita Albee, PA-C  saccharomyces boulardii (FLORASTOR) 250 MG capsule Take 1 capsule (250 mg total) by mouth 2 (two) times daily. Patient taking differently: Take 250 mg by mouth every evening. 04/08/19   Regalado, Belkys A, MD  sevelamer carbonate (RENVELA) 800 MG tablet Take 2 tablets (1,600 mg total) by mouth 3 (three) times daily with meals. Patient taking differently: Take 800-2,400 mg by mouth See admin instructions. Take 2400 mg  by mouth twice a day. Take [phone] mg by mouth with snacks. 11/13/21   Lewayne Bunting, MD  ticagrelor (BRILINTA) 90 MG TABS tablet Take 1 tablet (90 mg total) by mouth 2 (two) times daily. 05/08/22   Lanae Boast, MD  vitamin B-12 (CYANOCOBALAMIN) 1000 MCG tablet Take 1,000 mcg by mouth daily.     [provider]    Allergies:   Allergies  Allergen Reactions   Bee Venom Anaphylaxis   Lyrica [Pregabalin] Other (See Comments)    hallucinations   Prednisone Other (See Comments)    hallucinations   Zocor [Simvastatin] Nausea Only and Other (See Comments)    Headache with brand name only.  Can take the generic.    Social History:  reports that he quit smoking about 15 years ago. His smoking use included cigarettes. He has a 81.00 pack-year smoking history. He has never been exposed to tobacco smoke. He has never used smokeless tobacco. He reports current alcohol use. He reports that he does not use drugs.  Family History: Family History  Problem Relation Age of Onset   Stroke Mother    Leukemia Father    Stroke Sister    Heart attack Brother     Physical Exam: Vitals:   07/26/22 2000 07/26/22 2030 07/26/22 2142 07/26/22 2230  BP: (!) 120/56 (!) 134/52    Pulse: 80 80 86 83  Resp: 19 20 (!) 32 (!) 29  Temp:      TempSrc:      SpO2: 100% 100% 99% 96%  Weight:      Height:        General:  Alert and oriented times three, well developed and nourished, no acute distress Eyes: PERRLA, pink conjunctiva, no scleral icterus ENT: Moist oral mucosa, neck supple, no thyromegaly Lungs: clear to ascultation, no wheeze, no crackles, no use of accessory muscles Cardiovascular: regular rate and rhythm, no regurgitation, no gallops, no murmurs. No carotid bruits, no JVD, PPM/ICD left chest wall Abdomen: soft, positive BS, non-tender, non-distended, no organomegaly, not an acute abdomen GU: not examined Neuro: CN II - XII grossly intact, sensation intact Musculoskeletal: strength 5/5 all extremities, no clubbing, cyanosis, >3+ B/L LE edema, LLE in walking boot, Bruit right arm Skin: no rash, no subcutaneous crepitation, no decubitus Psych: appropriate patient   Labs on Admission:  Recent Labs    07/26/22 1505 07/26/22 1526 07/26/22 1618 07/26/22 2211  NA 135  --   137 137  K 4.1  --  4.1 4.3  CL 93*  --   --   --   CO2 30  --   --   --  GLUCOSE 163*  --   --   --   BUN 31*  --   --   --   CREATININE 5.96*  --   --   --   CALCIUM 8.9  --   --   --   MG  --  2.1  --   --    Recent Labs    07/26/22 1505 07/26/22 1618 07/26/22 2211  WBC 14.2*  --   --   NEUTROABS 11.0*  --   --   HGB 7.7* 8.2* 8.5*  HCT 24.1* 24.0* 25.0*  MCV 103.4*  --   --   PLT 179  --   --     Recent Labs    07/26/22 1526  DDIMER 1.20*    Micro Results: Recent Results (from the past 240 hour(s))  Resp panel by RT-PCR (RSV, Flu A&B, Covid) Nasopharyngeal Swab     Status: None   Collection Time: 07/26/22  4:14 PM   Specimen: Nasopharyngeal Swab; Nasal Swab  Result Value Ref Range Status   SARS Coronavirus 2 by RT PCR NEGATIVE NEGATIVE Final    Comment: (NOTE) SARS-CoV-2 target nucleic acids are NOT DETECTED.  The SARS-CoV-2 RNA is generally detectable in upper respiratory specimens during the acute phase of infection. The lowest concentration of SARS-CoV-2 viral copies this assay can detect is 138 copies/mL. A negative result does not preclude SARS-Cov-2 infection and should not be used as the sole basis for treatment or other patient management decisions. A negative result may occur with  improper specimen collection/handling, submission of specimen other than nasopharyngeal swab, presence of viral mutation(s) within the areas targeted by this assay, and inadequate number of viral copies(<138 copies/mL). A negative result must be combined with clinical observations, patient history, and epidemiological information. The expected result is Negative.  Fact Sheet for Patients:  BloggerCourse.com  Fact Sheet for Healthcare Providers:  SeriousBroker.it  This test is no t yet approved or cleared by the [place] FDA and  has been authorized for detection and/or diagnosis of SARS-CoV-2 by FDA under an  Emergency Use Authorization (EUA). This EUA will remain  in effect (meaning this test can be used) for the duration of the COVID-19 declaration under Section 564(b)(1) of the Act, 21 U.S.C.section 360bbb-3(b)(1), unless the authorization is terminated  or revoked sooner.       Influenza A by PCR NEGATIVE NEGATIVE Final   Influenza B by PCR NEGATIVE NEGATIVE Final    Comment: (NOTE) The Xpert Xpress SARS-CoV-2/FLU/RSV plus assay is intended as an aid in the diagnosis of influenza from Nasopharyngeal swab specimens and should not be used as a sole basis for treatment. Nasal washings and aspirates are unacceptable for Xpert Xpress SARS-CoV-2/FLU/RSV testing.  Fact Sheet for Patients: BloggerCourse.com  Fact Sheet for Healthcare Providers: SeriousBroker.it  This test is not yet approved or cleared by the [place] FDA and has been authorized for detection and/or diagnosis of SARS-CoV-2 by FDA under an Emergency Use Authorization (EUA). This EUA will remain in effect (meaning this test can be used) for the duration of the COVID-19 declaration under Section 564(b)(1) of the Act, 21 U.S.C. section 360bbb-3(b)(1), unless the authorization is terminated or revoked.     Resp Syncytial Virus by PCR NEGATIVE NEGATIVE Final    Comment: (NOTE) Fact Sheet for Patients: BloggerCourse.com  Fact Sheet for Healthcare Providers: SeriousBroker.it  This test is not yet approved or cleared by the [place] FDA and has been authorized for detection  and/or diagnosis of SARS-CoV-2 by FDA under an Emergency Use Authorization (EUA). This EUA will remain in effect (meaning this test can be used) for the duration of the COVID-19 declaration under Section 564(b)(1) of the Act, 21 U.S.C. section 360bbb-3(b)(1), unless the authorization is terminated or revoked.  Performed at [facility]  Lab, [address] [place], [place] [address]      Radiological Exams on Admission: CT Angio Chest PE W and/or Wo Contrast  Result Date: 07/26/2022 CLINICAL DATA:  Shortness of breath since this morning. Pulmonary embolism suspected, high probability. EXAM: CT ANGIOGRAPHY CHEST WITH CONTRAST TECHNIQUE: Multidetector CT imaging of the chest was performed using the standard protocol during bolus administration of intravenous contrast. Multiplanar CT image reconstructions and MIPs were obtained to evaluate the vascular anatomy. RADIATION DOSE REDUCTION: This exam was performed according to the departmental dose-optimization program which includes automated exposure control, adjustment of the mA and/or kV according to patient size and/or use of iterative reconstruction technique. CONTRAST:  75mL OMNIPAQUE IOHEXOL 350 MG/ML SOLN COMPARISON:  None Available. FINDINGS: Cardiovascular: Some of the most peripheral segmental and subsegmental pulmonary artery branches can not be definitively characterized due to patient breathing motion artifact, however, there is no pulmonary embolism identified within the main, lobar or central segmental pulmonary arteries bilaterally. No thoracic aortic aneurysm. Scattered aortic atherosclerosis. Diffuse coronary artery calcifications. No pericardial effusion. Mediastinum/Nodes: No mass or enlarged lymph nodes are seen within the mediastinum or perihilar regions. Esophagus is unremarkable. Trachea and central bronchi are unremarkable. Lungs/Pleura: Bilateral pleural effusions, both moderate to large in size, RIGHT slightly greater than LEFT, with associated compressive atelectasis. No pneumothorax. Upper Abdomen: No acute findings on limited images. Musculoskeletal: Degenerative spondylosis of the thoracolumbar spine, mild to moderate in degree. No acute-appearing osseous abnormality. Review of the MIP images confirms the above findings. IMPRESSION: 1. Bilateral pleural effusions,  both moderate to large in size, RIGHT slightly greater than LEFT, with associated compressive atelectasis. 2. No pulmonary embolism identified, with mild study limitations detailed above. 3. Diffuse coronary artery calcifications, particularly dense within the LEFT main and LEFT anterior descending coronary artery. Electronically Signed   By: Bary Richard M.D.   On: 07/26/2022 16:16   DG Chest Port 1 View  Result Date: 07/26/2022 CLINICAL DATA:  Dyspnea EXAM: PORTABLE CHEST 1 VIEW COMPARISON:  05/06/2022 chest radiograph. FINDINGS: Stable configuration of 3 lead left subclavian ICD. Stable cardiomediastinal silhouette with mild to moderate cardiomegaly. No pneumothorax. Small bilateral pleural effusions, right greater than left. Mild pulmonary edema. Hazy bibasilar lung opacities. IMPRESSION: 1. Mild congestive heart failure. 2. Small bilateral pleural effusions, right greater than left. 3. Hazy bibasilar lung opacities, favor atelectasis. Electronically Signed   By: Delbert Phenix M.D.   On: 07/26/2022 15:46    Assessment/Plan Present on Admission: ESRD/ Bilateral pleural effusions/ fluid overload  Secondary hyperparathyroidism of renal origin (HCC) -Admit to med telemetry -Patient went for hemodialysis tonight -Continue patient's Renvela, Nephro-Vite cements, and calcitriol -Nephrology on-call aware  Acute respiratory failure/ fluid overload / Acute on chronic systolic CHF   Ischemic cardiomyopathy/ Pacemaker -Patient being hemodialyzed -Radiologist on-call aware -Resume Coreg and Imdur,   Hyperlipidemia -Stable, resume Lipitor, Zetia   Essential hypertension -Stable, resume Coreg and Imdur   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  Atrial fibrillation (HCC) - SCAF -Stable, Coreg, Imdur, Brilinta, aspirin   Autoimmune hypotension -Continue midodrine   GERD (gastroesophageal reflux disease) -Continue Protonix, Florastor   Sleep apnea -   Anemia in chronic  kidney disease -Stable at baseline  oropharyngeal dysphagia  Major depression, recurrent, full remission (HCC)   Anothony Bursch 07/26/2022, 11:22 PM

## 2022-07-26 NOTE — ED Triage Notes (Addendum)
BIB [facility] from [facility]. Pt presents with ShOB that started this AM while sitting at desk. EMS report severely diminished lung sounds that has improved after 10mg  of Albuterol and 500 mcg of Atrovent. + sick contacts at facility. Pt was 87% on R/A prior to EMS arrival. Pt with Hx COPD, CHF, hypotension, and dialysis.    EMS Vitals  Temp 99.8 90/50 HR 80 and paced

## 2022-07-26 NOTE — Progress Notes (Signed)
Pt transported on BiPAP from 030 to CT and back without any complications. RN at bedside, RT will monitor.

## 2022-07-26 NOTE — ED Notes (Signed)
Unable to get second blood cx due to poor venous access.

## 2022-07-26 NOTE — Consult Note (Signed)
Cardiology Consult    Patient ID: Rodney Farley MRN: [id number], DOB/AGE: Apr 12, 1932   Admit date: 07/26/2022 Date of Consult: 07/26/2022 Requesting Provider: ***  PCP:  [name], MD   [facility] Providers Cardiologist:  Aithan [name], MD   { Click here to update MD or APP on Care Team, Refresh:1}    Patient Profile    Rodney Farley is a 89 y.o. male with a history of ***. He is being seen today (07/26/2022) for the evaluation of   History of Present Illness    Had fractured ankle in November and underwent ORIF, was NWB until just a couple of days. Noted that his legs have been swelling for the last 2 weeks despite regular dialysis and adherence to low-sodium diet. He has chronic exertional dyspnea but in the last couple of week has been occurring at rest, particularly with conversation. He denies pain similar to prior MI that is less anginal and he feels more like a discomfort associated with GERD after his PPI was discontinued. He was taking SL nitro frequently for this discomfort with partial improvement, but after restarting PPI the chest discomfort improved substantially. He denies any chest pains today. This morning he had worsening dyspnea associated with hypoxemia and was sent to the ED for further evaluation from his facility ([facility]). He denies fevers, chills, or new URI symptoms.   Trace basilar crackle, somewhat diminished. HS distant. JVD present. L ankle brace. 2+ edema.   Needs dialysis, TTE - if new WMA could consider cath but do not suspect ACS. Trend trops  Past Medical History   Past Medical History:  Diagnosis Date   AICD (automatic cardioverter/defibrillator) present    [employer]tronic pacer   Anemia    Anxiety and depression    Arthritis    Bell's palsy    left side. after shingles episode   BPH associated with nocturia    Chronic systolic CHF (congestive heart failure) ([place])    EF normalized by Echo 2019   COPD (chronic  obstructive pulmonary disease) ([place])    Severe   Coronary artery disease    a. s/p MI in 1994/1995 while in [place] s/p questionable PCI. 03/2015: progression of disease, for staged PCI.   Diabetic peripheral neuropathy (HCC)    GERD (gastroesophageal reflux disease)    Gout    Hard of hearing    B/L   History of chronic pancreatitis 07/23/2017   noted on CT abd/pelvis   History of shingles    Hypercholesterolemia    Hypertension    Obesity    Sleep apnea    "sleeps w/humidifyer when he panics and gets short of breath" (04/08/2015)   TIA (transient ischemic attack) X 3   Trigger middle finger of left hand    Type II diabetes mellitus ([place])    Wears glasses    Wears hearing aid     Past Surgical History:  Procedure Laterality Date   APPENDECTOMY     AV FISTULA PLACEMENT Right 02/07/2019   Procedure: ARTERIOVENOUS (AV) FISTULA CREATION RIGHT UPPER ARM;  Surgeon: Waynetta Sandy, MD;  Location: [place];  Service: Vascular;  Laterality: Right;   BIV ICD GENERATOR CHANGEOUT N/A 10/09/2021   Procedure: BIV ICD GENERATOR CHANGEOUT;  Surgeon: Deboraha Sprang, MD;  Location: [place] [facility];  Service: Cardiovascular;  Laterality: N/A;   CARDIAC CATHETERIZATION N/A 03/29/2015   Procedure: Right/Left Heart Cath and Coronary Angiography;  Surgeon: Juvon M [name], MD;  Location:  [place] [facility];  Service: Cardiovascular;  Laterality: N/A;   [place]   "after my MI; put me on heart RX after cath"   CARDIAC CATHETERIZATION N/A 04/09/2015   Procedure: Coronary Stent Intervention;  Surgeon: Hektor M [name], MD;  Location: [place] [facility];  Service: Cardiovascular;  Laterality: N/A;   CATARACT EXTRACTION W/ INTRAOCULAR LENS  IMPLANT, BILATERAL     CHOLECYSTECTOMY N/A 10/13/2017   Procedure: LAPAROSCOPIC CHOLECYSTECTOMY WITH LYSIS OF ADHESIONS;  Surgeon: Ileana Roup, MD;  Location: [facility];  Service: General;  Laterality: N/A;   COLONOSCOPY     CORONARY  ANGIOGRAPHY N/A 11/12/2021   Procedure: CORONARY ANGIOGRAPHY;  Surgeon: Wellington Hampshire, MD;  Location: [place] [facility];  Service: Cardiovascular;  Laterality: N/A;   CORONARY ANGIOGRAPHY N/A 12/24/2021   Procedure: CORONARY ANGIOGRAPHY;  Surgeon: Troy Sine, MD;  Location: [place] [facility];  Service: Cardiovascular;  Laterality: N/A;   CORONARY ANGIOGRAPHY N/A 03/31/2022   Procedure: CORONARY ANGIOGRAPHY;  Surgeon: Jettie Booze, MD;  Location: [place] [facility];  Service: Cardiovascular;  Laterality: N/A;   CORONARY ANGIOGRAPHY N/A 05/07/2022   Procedure: CORONARY ANGIOGRAPHY;  Surgeon: Jettie Booze, MD;  Location: [place] [facility];  Service: Cardiovascular;  Laterality: N/A;   CORONARY BALLOON ANGIOPLASTY N/A 12/24/2021   Procedure: CORONARY BALLOON ANGIOPLASTY;  Surgeon: Troy Sine, MD;  Location: [place] [facility];  Service: Cardiovascular;  Laterality: N/A;   CORONARY BALLOON ANGIOPLASTY N/A 02/09/2022   Procedure: CORONARY BALLOON ANGIOPLASTY;  Surgeon: Nelva Bush, MD;  Location: [place] [facility];  Service: Cardiovascular;  Laterality: N/A;  LAD   CORONARY BALLOON ANGIOPLASTY N/A 03/31/2022   Procedure: CORONARY BALLOON ANGIOPLASTY;  Surgeon: Jettie Booze, MD;  Location: [place] [facility];  Service: Cardiovascular;  Laterality: N/A;   CORONARY BALLOON ANGIOPLASTY N/A 05/07/2022   Procedure: CORONARY BALLOON ANGIOPLASTY;  Surgeon: Jettie Booze, MD;  Location: [place] [facility];  Service: Cardiovascular;  Laterality: N/A;   CORONARY STENT INTERVENTION N/A 11/12/2021   Procedure: CORONARY STENT INTERVENTION;  Surgeon: Wellington Hampshire, MD;  Location: [place] [facility];  Service: Cardiovascular;  Laterality: N/A;  [facility]   DENTAL SURGERY     EP IMPLANTABLE DEVICE N/A 09/23/2015   MDT CRT-D, Dr. Caryl Comes   HIATAL HERNIA REPAIR  1977   ILEOCECETOMY N/A 03/27/2017   Procedure: ILEOCECECTOMY;  Surgeon: Ileana Roup, MD;  Location: [place];   Service: General;  Laterality: N/A;   INSERT / REPLACE / REMOVE PACEMAKER  07/2008   Complete heart block status post DDD with good function   INTRAVASCULAR IMAGING/OCT N/A 12/24/2021   Procedure: INTRAVASCULAR IMAGING/OCT;  Surgeon: Troy Sine, MD;  Location: [place] [facility];  Service: Cardiovascular;  Laterality: N/A;   INTRAVASCULAR LITHOTRIPSY  02/09/2022   Procedure: INTRAVASCULAR LITHOTRIPSY;  Surgeon: Nelva Bush, MD;  Location: [place] [facility];  Service: Cardiovascular;;  LAD   INTRAVASCULAR ULTRASOUND/IVUS N/A 02/09/2022   Procedure: Intravascular Ultrasound/IVUS;  Surgeon: Nelva Bush, MD;  Location: [place] [facility];  Service: Cardiovascular;  Laterality: N/A;  LAD   IR FLUORO GUIDE CV LINE RIGHT  04/03/2019   IR US GUIDE VASC ACCESS RIGHT  04/03/2019   LAPAROTOMY N/A 03/27/2017   Procedure: EXPLORATORY LAPAROTOMY;  Surgeon: Ileana Roup, MD;  Location: [place];  Service: General;  Laterality: N/A;   ORIF ANKLE FRACTURE Left 06/02/2022   Procedure: OPEN REDUCTION INTERNAL FIXATION (ORIF)  LEFT LATERAL MALLEOLUS;  Surgeon: Georgeanna Harrison, MD;  Location: [place];  Service: Orthopedics;  Laterality: Left;   RIGHT HEART CATH AND CORONARY ANGIOGRAPHY N/A 02/09/2022   Procedure: RIGHT HEART CATH AND CORONARY ANGIOGRAPHY;  Surgeon: Nelva Bush, MD;  Location: [place] [facility];  Service: Cardiovascular;  Laterality: N/A;   TONSILLECTOMY     UPPER GI ENDOSCOPY       Allergies  Allergen Reactions   Bee Venom Anaphylaxis   Lyrica [Pregabalin] Other (See Comments)    hallucinations   Prednisone Other (See Comments)    hallucinations   Zocor [Simvastatin] Nausea Only and Other (See Comments)    Headache with brand name only.  Can take the generic.   Inpatient Medications      Family History    Family History  Problem Relation Age of Onset   Stroke Mother    Leukemia Father    Stroke Sister    Heart attack Brother    He indicated that his mother is  deceased. He indicated that his father is deceased. He indicated that his sister is alive. He indicated that his brother is alive. He indicated that his maternal grandmother is deceased. He indicated that his maternal grandfather is deceased. He indicated that his paternal grandmother is deceased. He indicated that his paternal grandfather is deceased.   Social History    Social History   Socioeconomic History   Marital status: Legally Separated    Spouse name: Not on file   Number of children: 1   Years of education: college   Highest education level: Not on file  Occupational History   Occupation: Retired  Tobacco Use   Smoking status: Former    Packs/day: 1.50    Years: 54.00    Total pack years: 81.00    Types: Cigarettes    Quit date: 07/18/2007    Years since quitting: 15.0    Passive exposure: Never   Smokeless tobacco: Never  Vaping Use   Vaping Use: Never used  Substance and Sexual Activity   Alcohol use: Yes    Comment: rare   Drug use: No   Sexual activity: Not Currently  Other Topics Concern   Not on file  Social History Narrative   Married 1994 (together since 1989) 1 son, 1 stepson. 3 grandkids, 6 great grandkids      Retired from [employer]. Had 2 years of collge.       Faith: Mormon      Here on [employer] since 2004 from [place];    Social Determinants of Health   Financial Resource Strain: Not on file  Food Insecurity: No Food Insecurity (05/07/2022)   Hunger Vital Sign    Worried About Running Out of Food in the Last Year: Never true    Ran Out of Food in the Last Year: Never true  Transportation Needs: No Transportation Needs (05/07/2022)   PRAPARE - Hydrologist (Medical): No    Lack of Transportation (Non-Medical): No  Physical Activity: Not on file  Stress: Not on file  Social Connections: Not on file  Intimate Partner Violence: Not At Risk (05/07/2022)   Humiliation, Afraid, Rape, and Kick  questionnaire    Fear of Current or Ex-Partner: No    Emotionally Abused: No    Physically Abused: No    Sexually Abused: No     Review of Systems    General:  No chills, fever, night sweats or weight  changes.  Cardiovascular:  No chest pain, dyspnea on exertion, edema, orthopnea, palpitations, paroxysmal nocturnal dyspnea. Dermatological: No rash, lesions/masses Respiratory: No cough, dyspnea Urologic: No hematuria, dysuria Abdominal:   No nausea, vomiting, diarrhea, bright red blood per rectum, melena, or hematemesis Neurologic:  No visual changes, wkns, changes in mental status. All other systems reviewed and are otherwise negative except as noted above.  Physical Exam    Blood pressure (!) 110/51, pulse 80, temperature 98.5 F (36.9 C), temperature source Oral, resp. rate 18, height 5\' 7"  (1.702 m), weight 90.7 kg, SpO2 100 %.  FiO2 (%):  [40 %] 40 % No intake or output data in the 24 hours ending 07/26/22 1847 Wt Readings from Last 3 Encounters:  07/26/22 90.7 kg  06/09/22 90.4 kg  05/19/22 96.8 kg    CONSTITUTIONAL: alert and conversant, well-appearing, nourished, no distress HEENT: normal NECK: no JVD, no masses CARDIAC: Regular rhythm. Normal S1/S2, no S3/S4. No murmur. No friction rub. JVP ***  VASCULAR: Radial pulses intact bilaterally. No carotid bruits. PULMONARY/CHEST WALL: no deformities, normal breath sounds bilaterally, normal work of breathing ABDOMINAL: soft, non-tender, non-distended EXTREMITIES: no edema, no muscle atrophy, warm and well-perfused SKIN: Dry and intact without apparent rashes or wounds. No peripheral cyanosis. NEUROLOGIC: alert, no abnormal movements, cranial nerves grossly intact. PSYCH: normal affect, normal speech and language   Labs    Recent Labs    07/26/22 1505  TROPONINIHS 1,379*   Lab Results  Component Value Date   WBC 14.2 (H) 07/26/2022   HGB 8.2 (L) 07/26/2022   HCT 24.0 (L) 07/26/2022   MCV 103.4 (H) 07/26/2022    PLT 179 07/26/2022    Recent Labs  Lab 07/26/22 1505 07/26/22 1618  NA 135 137  K 4.1 4.1  CL 93*  --   CO2 30  --   BUN 31*  --   CREATININE 5.96*  --   CALCIUM 8.9  --   GLUCOSE 163*  --    Lab Results  Component Value Date   CHOL 153 01/06/2022   HDL 41 01/06/2022   LDLCALC 79 01/06/2022   TRIG 197 (H) 01/06/2022   Lab Results  Component Value Date   DDIMER 1.20 (H) 07/26/2022   Recent Labs    07/26/22 1505  BNP 1,410.5*   No results for input(s): "PROBNP" in the last 8760 hours.    Radiology Studies    CT Angio Chest PE W and/or Wo Contrast  Result Date: 07/26/2022 CLINICAL DATA:  Shortness of breath since this morning. Pulmonary embolism suspected, high probability. EXAM: CT ANGIOGRAPHY CHEST WITH CONTRAST TECHNIQUE: Multidetector CT imaging of the chest was performed using the standard protocol during bolus administration of intravenous contrast. Multiplanar CT image reconstructions and MIPs were obtained to evaluate the vascular anatomy. RADIATION DOSE REDUCTION: This exam was performed according to the departmental dose-optimization program which includes automated exposure control, adjustment of the mA and/or kV according to patient size and/or use of iterative reconstruction technique. CONTRAST:  102mL OMNIPAQUE IOHEXOL 350 MG/ML SOLN COMPARISON:  None Available. FINDINGS: Cardiovascular: Some of the most peripheral segmental and subsegmental pulmonary artery branches can not be definitively characterized due to patient breathing motion artifact, however, there is no pulmonary embolism identified within the main, lobar or central segmental pulmonary arteries bilaterally. No thoracic aortic aneurysm. Scattered aortic atherosclerosis. Diffuse coronary artery calcifications. No pericardial effusion. Mediastinum/Nodes: No mass or enlarged lymph nodes are seen within the mediastinum or perihilar regions. Esophagus is unremarkable. Trachea and  central bronchi are  unremarkable. Lungs/Pleura: Bilateral pleural effusions, both moderate to large in size, RIGHT slightly greater than LEFT, with associated compressive atelectasis. No pneumothorax. Upper Abdomen: No acute findings on limited images. Musculoskeletal: Degenerative spondylosis of the thoracolumbar spine, mild to moderate in degree. No acute-appearing osseous abnormality. Review of the MIP images confirms the above findings. IMPRESSION: 1. Bilateral pleural effusions, both moderate to large in size, RIGHT slightly greater than LEFT, with associated compressive atelectasis. 2. No pulmonary embolism identified, with mild study limitations detailed above. 3. Diffuse coronary artery calcifications, particularly dense within the LEFT main and LEFT anterior descending coronary artery. Electronically Signed   By: Franki Cabot M.D.   On: 07/26/2022 16:16   DG Chest Port 1 View  Result Date: 07/26/2022 CLINICAL DATA:  Dyspnea EXAM: PORTABLE CHEST 1 VIEW COMPARISON:  05/06/2022 chest radiograph. FINDINGS: Stable configuration of 3 lead left subclavian ICD. Stable cardiomediastinal silhouette with mild to moderate cardiomegaly. No pneumothorax. Small bilateral pleural effusions, right greater than left. Mild pulmonary edema. Hazy bibasilar lung opacities. IMPRESSION: 1. Mild congestive heart failure. 2. Small bilateral pleural effusions, right greater than left. 3. Hazy bibasilar lung opacities, favor atelectasis. Electronically Signed   By: Ilona Sorrel M.D.   On: 07/26/2022 15:46    ECG & Cardiac Imaging    *** - personally reviewed.  Assessment & Plan    ***  Signed, Marykay Lex, MD 07/26/2022, 6:47 PM  For questions or updates, please contact   Please consult [email or web address] for contact info under Cardiology/STEMI.

## 2022-07-27 ENCOUNTER — Encounter (HOSPITAL_COMMUNITY): Payer: Self-pay | Admitting: Family Medicine

## 2022-07-27 ENCOUNTER — Inpatient Hospital Stay (HOSPITAL_COMMUNITY): Payer: PPO

## 2022-07-27 DIAGNOSIS — I2489 Other forms of acute ischemic heart disease: Secondary | ICD-10-CM | POA: Diagnosis not present

## 2022-07-27 DIAGNOSIS — I5021 Acute systolic (congestive) heart failure: Secondary | ICD-10-CM

## 2022-07-27 DIAGNOSIS — J9601 Acute respiratory failure with hypoxia: Secondary | ICD-10-CM | POA: Diagnosis not present

## 2022-07-27 DIAGNOSIS — I5023 Acute on chronic systolic (congestive) heart failure: Secondary | ICD-10-CM | POA: Diagnosis not present

## 2022-07-27 LAB — BASIC METABOLIC PANEL
Anion gap: 14 (ref 5–15)
BUN: 34 mg/dL — ABNORMAL HIGH (ref 8–23)
CO2: 28 mmol/L (ref 22–32)
Calcium: 8.7 mg/dL — ABNORMAL LOW (ref 8.9–10.3)
Chloride: 94 mmol/L — ABNORMAL LOW (ref 98–111)
Creatinine, Ser: 6.54 mg/dL — ABNORMAL HIGH (ref 0.61–1.24)
GFR, Estimated: 8 mL/min — ABNORMAL LOW (ref >60)
Glucose, Bld: 116 mg/dL — ABNORMAL HIGH (ref 70–99)
Potassium: 4.5 mmol/L (ref 3.5–5.1)
Sodium: 136 mmol/L (ref 135–145)

## 2022-07-27 LAB — ECHOCARDIOGRAM COMPLETE
AR max vel: 1.29 cm2
AV Area VTI: 1.2 cm2
AV Area mean vel: 1.29 cm2
AV Mean grad: 20 mmHg
AV Peak grad: 31.8 mmHg
Ao pk vel: 2.82 m/s
Area-P 1/2: 4.83 cm2
Calc EF: 36.7 %
Height: 67 in
MV VTI: 2.64 cm2
S' Lateral: 4.4 cm
Single Plane A2C EF: 39.8 %
Single Plane A4C EF: 32.2 %
Weight: 3200 oz

## 2022-07-27 LAB — CREATININE, SERUM
Creatinine, Ser: 6.51 mg/dL — ABNORMAL HIGH (ref 0.61–1.24)
GFR, Estimated: 8 mL/min — ABNORMAL LOW (ref >60)

## 2022-07-27 LAB — CBC
HCT: 24 % — ABNORMAL LOW (ref 39.0–52.0)
Hemoglobin: 7.7 g/dL — ABNORMAL LOW (ref 13.0–17.0)
MCH: 32.5 pg (ref 26.0–34.0)
MCHC: 32.1 g/dL (ref 30.0–36.0)
MCV: 101.3 fL — ABNORMAL HIGH (ref 80.0–100.0)
Platelets: 178 10*3/uL (ref 150–400)
RBC: 2.37 MIL/uL — ABNORMAL LOW (ref 4.22–5.81)
RDW: 17.3 % — ABNORMAL HIGH (ref 11.5–15.5)
WBC: 11.8 10*3/uL — ABNORMAL HIGH (ref 4.0–10.5)
nRBC: 0 % (ref 0.0–0.2)

## 2022-07-27 LAB — HEPATITIS B SURFACE ANTIGEN: Hepatitis B Surface Ag: NONREACTIVE

## 2022-07-27 LAB — ALBUMIN: Albumin: 3 g/dL — ABNORMAL LOW (ref 3.5–5.0)

## 2022-07-27 LAB — MRSA NEXT GEN BY PCR, NASAL: MRSA by PCR Next Gen: NOT DETECTED

## 2022-07-27 LAB — PHOSPHORUS: Phosphorus: 4.8 mg/dL — ABNORMAL HIGH (ref 2.5–4.6)

## 2022-07-27 LAB — MAGNESIUM: Magnesium: 2.1 mg/dL (ref 1.7–2.4)

## 2022-07-27 MED ORDER — HYDRALAZINE HCL 10 MG PO TABS
10.0000 mg | ORAL_TABLET | Freq: Three times a day (TID) | ORAL | Status: DC
  Administered 2022-07-28: 10 mg via ORAL
  Filled 2022-07-27 (×2): qty 1

## 2022-07-27 MED ORDER — DARBEPOETIN ALFA 150 MCG/0.3ML IJ SOSY
150.0000 ug | PREFILLED_SYRINGE | INTRAMUSCULAR | Status: DC
  Administered 2022-07-27: 150 ug via SUBCUTANEOUS
  Filled 2022-07-27 (×2): qty 0.3

## 2022-07-27 MED ORDER — HYDROXYZINE HCL 25 MG PO TABS
25.0000 mg | ORAL_TABLET | Freq: Three times a day (TID) | ORAL | Status: DC | PRN
  Administered 2022-07-27: 50 mg via ORAL
  Administered 2022-07-28 (×2): 25 mg via ORAL
  Administered 2022-07-28 – 2022-07-31 (×6): 50 mg via ORAL
  Filled 2022-07-27 (×5): qty 2
  Filled 2022-07-27: qty 1
  Filled 2022-07-27 (×3): qty 2

## 2022-07-27 MED ORDER — PERFLUTREN LIPID MICROSPHERE
1.0000 mL | INTRAVENOUS | Status: AC | PRN
  Administered 2022-07-27: 3 mL via INTRAVENOUS

## 2022-07-27 NOTE — Progress Notes (Signed)
Received patient in bed to unit.  Alert and oriented.  Informed consent signed and in chart.   Treatment initiated: 0004 Treatment completed: 0400  Patient tolerated well.  Transported back to the room  Alert, without acute distress.  Hand-off given to patient's nurse.   Access used: fistula Access issues: none  Total UF removed: 3500 Medication(s) given: none Post HD VS: 135/111 Post HD weight: unable to pbtain   Rodney Farley  07/27/22 0439  Vitals  Temp 98.5 F (36.9 C)  Temp Source Oral  BP (!) 89/48  MAP (mmHg) (!) 61  BP Location Left Arm  BP Method Automatic  Patient Position (if appropriate) Lying  Pulse Rate 81  Pulse Rate Source Monitor  ECG Heart Rate 80  Resp (!) 26  Oxygen Therapy  SpO2 97 %  O2 Device Room Air  Patient Activity (if Appropriate) In bed  Pulse Oximetry Type Continuous  Post Treatment  Dialyzer Clearance Lightly streaked  Duration of HD Treatment -hour(s) 3.5 hour(s)  Hemodialysis Intake (mL) 0 mL  Liters Processed 83.9  Fluid Removed (mL) 3500 mL  Tolerated HD Treatment Yes  Post-Hemodialysis Comments HD tx completed as expected, UF goal reached, no acute distress, tolerated well, pt is stable.  AVG/AVF Arterial Site Held (minutes) 10 minutes  AVG/AVF Venous Site Held (minutes) 10 minutes  Note  Observations pt is in the stretcher resting, eyes closed, pt is stable.  Fistula / Graft Right Upper arm Arteriovenous fistula  Placement Date/Time: 02/07/19 0753   Placed prior to admission: No  Orientation: Right  Access Location: Upper arm  Access Type: Arteriovenous fistula  Site Condition No complications  Fistula / Graft Assessment Present;Thrill;Bruit  Status Accessed;Patent  Drainage Description None

## 2022-07-27 NOTE — ED Notes (Signed)
Pt to vascular.

## 2022-07-27 NOTE — Progress Notes (Signed)
[facility] PROGRESS NOTE  Rodney Farley (DOB: Aug 20, 1931) JKD:[id number] PCP: [name], MD  Brief Narrative: Rodney Farley is an 89 year old male with past medical history of anxiety and depression, chronic systolic heart failure, COPD, diabetes mellitus, peripheral neuropathy, hyperlipidemia, hypertension, sleep apnea, AICD who presented from [facility] on 1/14 with chest pain and shortness of breath.  He was found to be hypoxic. CTA showed no PE or pneumonia, but did reveal pleural effusions R > L and appeared volume overloaded. HD was performed and is planned to be repeated In the ER CT positive for bilateral pleural effusions, both moderate to large in size, right slightly greater than left.  EDP contacted cardiologist Dr. Benny Lennert and nephrologist Dr. Sheran Fava.  Hemodialysis planned.  Subjective: No significant improvement in mid/lower chest pain that worsens with deeper breaths and shortness of breath with any exertion thus far. No bleeding recently or currently.   Objective: BP (!) 91/58 (BP Location: Left Arm)   Pulse 86   Temp 98.8 F (37.1 C) (Oral)   Resp 16   Ht 5\' 7"  (1.702 m)   Wt 90.7 kg   SpO2 94%   BMI 31.32 kg/m   Gen: No distress in hallway in ED Pulm: Crackles at bases, nonlabored  CV: RRR, +pitting dependent edema, ~1+ GI: Soft, NT, ND, +BS  Neuro: Alert and oriented. No new focal deficits. Ext: Warm, no deformities. L postoperative CAM boot in place Skin: No rashes, lesions or ulcers on visualized skin   Assessment & Plan:  Acute hypoxic respiratory failure due to pulmonary edema and pleural effusions, acute on chronic HFrEF: No infectious symptoms yet, though pt reported sick contacts. WBC is 11.8k and trending down without abx, remains afebrile. RSV, flu, covid panel negative. - Continue volume management with HD per nephrology - Repeat echo pending to investigate cause of decompensation.  - Holding coreg - Continue imdur, adding  hydralazine  CAD, ICM, s/p AICD, HLD: Troponin elevation currently suspected to be due to demand ischemia in setting of acute CHF and ESRD. History of ISR of ostial LAD stent s/p balloon angioplasty Oct 2023 with limited options.  - Appreciate cardiology assessment and recommendations.  - Continue BB, long acting nitrate, ASA, brilinta, not starting heparin at this time.  - Continue statin, zetia, outpatient repatha.   ESRD, anemia of ESRD, secondary hyperparathyroidism:  - HD per nephrology - ESA 1/16, continue renvela per nephrology  History of left ankle fracture: Nov 2023.  - continue orthopedics follow up  Chronic hypotension:  - Continue pre-dialysis midodrine (MWF)  GERD:  - PPI  OSA:  - CPAP  Patrecia Pour, MD [facility] [email or web address] 07/27/2022, 12:10 PM

## 2022-07-27 NOTE — ED Notes (Signed)
Patient wheeled to restroom. 

## 2022-07-27 NOTE — Progress Notes (Signed)
Cardiology Progress Note  Patient ID: Rodney Farley MRN: [id number] DOB: [birth year] Date of Encounter: 07/27/2022  Primary Cardiologist: Kaysan [name], MD  Subjective   Chief Complaint: SOB  HPI: Admitted with shortness of breath and volume overload.  Status post 3 L removal with hemodialysis.  Still volume overloaded.  Echo pending.  No chest pain reported.  ROS:  All other ROS reviewed and negative. Pertinent positives noted in the HPI.     Inpatient Medications  Scheduled Meds:  aspirin EC  81 mg Oral Daily   atorvastatin  80 mg Oral QHS   carvedilol  3.125 mg Oral BID WC   Chlorhexidine Gluconate Cloth  6 each Topical Q0600   cyanocobalamin  1,000 mcg Oral Daily   darbepoetin (ARANESP) injection - DIALYSIS  150 mcg Subcutaneous Q Mon-1800   docusate sodium  100 mg Oral BID   ezetimibe  10 mg Oral Daily   gabapentin  100 mg Oral QHS   heparin  5,000 Units Subcutaneous Q8H   isosorbide mononitrate  15 mg Oral Daily   melatonin  3 mg Oral QHS   midodrine  10 mg Oral Q M,W,F-HD   multivitamin  1 tablet Oral QHS   pantoprazole  40 mg Oral Daily   polyethylene glycol  17 g Oral Daily   polyvinyl alcohol  2 drop Both Eyes TID   saccharomyces boulardii  250 mg Oral BID   sevelamer carbonate  1,600 mg Oral TID WC   ticagrelor  90 mg Oral BID   Continuous Infusions:  promethazine (PHENERGAN) injection (IM or IVPB)     PRN Meds: acetaminophen **OR** acetaminophen, HYDROcodone-acetaminophen, nitroGLYCERIN, promethazine (PHENERGAN) injection (IM or IVPB), senna-docusate   Vital Signs   Vitals:   07/27/22 0416 07/27/22 0439 07/27/22 0528 07/27/22 0618  BP: (!) 241/191 (!) 89/48 (!) 135/111   Pulse: 84 81    Resp: (!) 22 (!) 26    Temp:  98.5 F (36.9 C)  98.8 F (37.1 C)  TempSrc:  Oral  Oral  SpO2: 99% 97%    Weight:      Height:        Intake/Output Summary (Last 24 hours) at 07/27/2022 0932 Last data filed at 07/27/2022 0439 Gross per 24 hour  Intake --   Output 3500 ml  Net -3500 ml      07/26/2022    2:42 PM 06/09/2022    8:55 AM 06/09/2022    6:01 AM  Last 3 Weights  Weight (lbs) 200 lb 199 lb 4.7 oz 192 lb 10.9 oz  Weight (kg) 90.719 kg 90.4 kg 87.4 kg      ECG  The most recent ECG shows AV paced rhythm heart rate 83, which I personally reviewed.   Physical Exam   Vitals:   07/27/22 0416 07/27/22 0439 07/27/22 0528 07/27/22 0618  BP: (!) 241/191 (!) 89/48 (!) 135/111   Pulse: 84 81    Resp: (!) 22 (!) 26    Temp:  98.5 F (36.9 C)  98.8 F (37.1 C)  TempSrc:  Oral  Oral  SpO2: 99% 97%    Weight:      Height:        Intake/Output Summary (Last 24 hours) at 07/27/2022 0932 Last data filed at 07/27/2022 0439 Gross per 24 hour  Intake --  Output 3500 ml  Net -3500 ml       07/26/2022    2:42 PM 06/09/2022    8:55 AM 06/09/2022  6:01 AM  Last 3 Weights  Weight (lbs) 200 lb 199 lb 4.7 oz 192 lb 10.9 oz  Weight (kg) 90.719 kg 90.4 kg 87.4 kg    Body mass index is 31.32 kg/m.  General: Well nourished, well developed, in no acute distress Head: Atraumatic, normal size  Eyes: PEERLA, EOMI  Neck: Supple, JVD 12-15 cmH2O Endocrine: No thryomegaly Cardiac: Normal S1, S2; RRR; 3 out of 6 systolic ejection murmur Lungs: Clear to auscultation bilaterally, no wheezing, rhonchi or rales  Abd: Soft, nontender, no hepatomegaly  Ext: 2+ pitting edema Musculoskeletal: No deformities, BUE and BLE strength normal and equal Skin: Warm and dry, no rashes   Neuro: Alert and oriented to person, place, time, and situation, CNII-XII grossly intact, no focal deficits  Psych: Normal mood and affect   Labs  High Sensitivity Troponin:   Recent Labs  Lab 07/26/22 1505 07/26/22 1705  TROPONINIHS 1,379* 1,334*     Cardiac EnzymesNo results for input(s): "TROPONINI" in the last 168 hours. No results for input(s): "TROPIPOC" in the last 168 hours.  Chemistry Recent Labs  Lab 07/26/22 1505 07/26/22 1618 07/26/22 2211  07/27/22 0005  NA 135 137 137 136  K 4.1 4.1 4.3 4.5  CL 93*  --   --  94*  CO2 30  --   --  28  GLUCOSE 163*  --   --  116*  BUN 31*  --   --  34*  CREATININE 5.96*  --   --  6.54*  6.51*  CALCIUM 8.9  --   --  8.7*  ALBUMIN  --   --   --  3.0*  GFRNONAA 8*  --   --  8*  8*  ANIONGAP 12  --   --  14    Hematology Recent Labs  Lab 07/26/22 1505 07/26/22 1618 07/26/22 2211 07/27/22 0005  WBC 14.2*  --   --  11.8*  RBC 2.33*  --   --  2.37*  HGB 7.7* 8.2* 8.5* 7.7*  HCT 24.1* 24.0* 25.0* 24.0*  MCV 103.4*  --   --  101.3*  MCH 33.0  --   --  32.5  MCHC 32.0  --   --  32.1  RDW 17.2*  --   --  17.3*  PLT 179  --   --  178   BNP Recent Labs  Lab 07/26/22 1505  BNP 1,410.5*    DDimer  Recent Labs  Lab 07/26/22 1526  DDIMER 1.20*     Radiology  CT Angio Chest PE W and/or Wo Contrast  Result Date: 07/26/2022 CLINICAL DATA:  Shortness of breath since this morning. Pulmonary embolism suspected, high probability. EXAM: CT ANGIOGRAPHY CHEST WITH CONTRAST TECHNIQUE: Multidetector CT imaging of the chest was performed using the standard protocol during bolus administration of intravenous contrast. Multiplanar CT image reconstructions and MIPs were obtained to evaluate the vascular anatomy. RADIATION DOSE REDUCTION: This exam was performed according to the departmental dose-optimization program which includes automated exposure control, adjustment of the mA and/or kV according to patient size and/or use of iterative reconstruction technique. CONTRAST:  51mL OMNIPAQUE IOHEXOL 350 MG/ML SOLN COMPARISON:  None Available. FINDINGS: Cardiovascular: Some of the most peripheral segmental and subsegmental pulmonary artery branches can not be definitively characterized due to patient breathing motion artifact, however, there is no pulmonary embolism identified within the main, lobar or central segmental pulmonary arteries bilaterally. No thoracic aortic aneurysm. Scattered aortic  atherosclerosis. Diffuse coronary artery calcifications. No  pericardial effusion. Mediastinum/Nodes: No mass or enlarged lymph nodes are seen within the mediastinum or perihilar regions. Esophagus is unremarkable. Trachea and central bronchi are unremarkable. Lungs/Pleura: Bilateral pleural effusions, both moderate to large in size, RIGHT slightly greater than LEFT, with associated compressive atelectasis. No pneumothorax. Upper Abdomen: No acute findings on limited images. Musculoskeletal: Degenerative spondylosis of the thoracolumbar spine, mild to moderate in degree. No acute-appearing osseous abnormality. Review of the MIP images confirms the above findings. IMPRESSION: 1. Bilateral pleural effusions, both moderate to large in size, RIGHT slightly greater than LEFT, with associated compressive atelectasis. 2. No pulmonary embolism identified, with mild study limitations detailed above. 3. Diffuse coronary artery calcifications, particularly dense within the LEFT main and LEFT anterior descending coronary artery. Electronically Signed   By: Franki Cabot M.D.   On: 07/26/2022 16:16   DG Chest Port 1 View  Result Date: 07/26/2022 CLINICAL DATA:  Dyspnea EXAM: PORTABLE CHEST 1 VIEW COMPARISON:  05/06/2022 chest radiograph. FINDINGS: Stable configuration of 3 lead left subclavian ICD. Stable cardiomediastinal silhouette with mild to moderate cardiomegaly. No pneumothorax. Small bilateral pleural effusions, right greater than left. Mild pulmonary edema. Hazy bibasilar lung opacities. IMPRESSION: 1. Mild congestive heart failure. 2. Small bilateral pleural effusions, right greater than left. 3. Hazy bibasilar lung opacities, favor atelectasis. Electronically Signed   By: Ilona Sorrel M.D.   On: 07/26/2022 15:46    Cardiac Studies  TTE 05/07/2022  1. Left ventricular ejection fraction, by estimation, is 45 to 50%. The  left ventricle has mildly decreased function. The left ventricle  demonstrates regional  wall motion abnormalities (see scoring  diagram/findings for description). Left ventricular  diastolic parameters are indeterminate.   2. Right ventricular systolic function is normal. The right ventricular  size is normal. There is normal pulmonary artery systolic pressure.   3. The mitral valve is normal in structure. Trivial mitral valve  regurgitation. No evidence of mitral stenosis. Moderate mitral annular  calcification.   4. The aortic valve is normal in structure. There is mild calcification  of the aortic valve. There is mild thickening of the aortic valve. Aortic  valve regurgitation is trivial. Moderate aortic valve stenosis. Aortic  valve area, by VTI measures 1.42  cm. Aortic valve mean gradient measures 20.0 mmHg. Aortic valve Vmax  measures 2.97 m/s.   5. The inferior vena cava is normal in size with greater than 50%  respiratory variability, suggesting right atrial pressure of 3 mmHg.   LHC 05/07/2022   Ost RCA to Prox RCA lesion is 70% stenosed.  Small vessel.   Prox Cx lesion is 30% stenosed.   Mid Cx to Dist Cx lesion is 40% stenosed.   Mid Cx lesion is 30% stenosed.   2nd Diag-1 lesion is 30% stenosed.   3rd Mrg lesion is 50% stenosed.   Non-stenotic 2nd Diag-2 lesion was previously treated.  Patent stent.   Dist LM to Ost LAD lesion is 90% stenosed.   Scoring balloon angioplasty was performed using a BALLN SCOREFLEX 3.50X10.   Post intervention, there is a 20% residual stenosis.   Another episode of restenosis in the ostial to proximal LAD.  Successful balloon angioplasty with score flex balloon at high pressure followed by [place] balloon at high pressure.   Unfortunately, there are no other options at our facility.  We will need to look into availability of drug-coated balloon or brachytherapy as possible treatments if this recurs.  Patient Profile  Rodney Farley is a 89 y.o. male  with systolic heart failure (EF 45 to 50%), CAD status post PCI, ESRD on  hemodialysis, COPD, diabetes who was admitted on 0/98/1191 for acute systolic heart failure and volume overload.  Cardiology consulted for elevated troponin value.  Assessment & Plan   # Acute hypoxic respiratory failure # Acute systolic heart failure, EF 45-50% # ESRD on hemodialysis -Remains grossly volume overloaded despite 3 L of fluid removal with hemodialysis.  Would recommend another session today. -Unclear what is causing this change in his condition.  I would like to recheck an echocardiogram.  His heart failure may be worsening. -Hold beta-blocker given elevated lactic acid.  Would like to make sure that his ejection fraction is not severely reduced. -Continue Imdur 15 mg daily.  Add hydralazine 10 mg 3 times daily.  Not a candidate for ACE/ARB/ARNI/MRA given ESRD. -After we obtain his echocardiogram we can discuss further if repeat left heart catheterization is needed.  He has very limited options given significant ISR on recent left heart catheterization in October 2023.  # Elevated troponin, demand ischemia # CAD status post PCI -Admitted with gross volume overload.  Suspect his ejection fraction has dropped.  Troponin is minimally elevated for him and relatively flat.  No symptoms of chest pain.  Complex cardiac history.  Found to have significant in-stent restenosis of a prior ostial LAD stent.  Underwent balloon angioplasty in October 2023.  There were very limited options for further intervention per report with interventional cardiology. -Overall, I suspect his troponin elevation is secondary to worsening congestive heart failure.  No need to treat this as an acute coronary syndrome.  He is on aspirin and Brilinta regardless.  Will hold heparin.  His hemoglobin is trending down as well. -Holding his beta-blocker in the setting of congestive heart failure until we know what his ejection fraction is.  On Imdur. -Continue with supportive care for now.  On statin, Zetia and Repatha as  an outpatient. -Based on the results of his echocardiogram we will have a discussion about reattempt at left heart catheterization versus medical management.  Overall given his advanced age and advanced comorbidities I think medical management will be the best option.  We will have to see how he does.  # Anemia -No obvious signs of bleeding.  Hemoglobin 7.7.  Per primary team.  # Goals of care -His approach may end up being palliative.  Would recommend palliative care consultation.    For questions or updates, please contact [place] Please consult [email or web address] for contact info under        Signed, [place] Bells T. Audie Box, MD, [place]  07/27/2022 9:32 AM

## 2022-07-27 NOTE — Progress Notes (Signed)
[facility] Progress Note   Assessment/ Plan:   P HD: [facility] MWF  4h  400/1.5  94kg  2/2 bath  AVF  Hep 2500+ 1076midrun - venofer 100mg  tiw thru 1/24 - mircera 150 mcg q2, last 12/31, due 1/14 (today) - last HD 1/12, post wt 95.9kg   - dry wt recently raised 93 > 94kg   Assessment/ Plan: SOB/ resp distress - w/ bronchspasm per ED notes, also mild CHF by xray. +LE edema. HD with 3L off 07/27/22, needs another session, tomorrow 1/16 AM ESRD - on HD MWF. Has not missed HD. HD 1/15, then again tomorrow 1/16.  HTN/ volume - BP's wnl, vol excess as above.  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  MBD ckd - Ca in range, add on phos/ albumin.  CAD / sp AICD Dispo: pending  ** NO charge- H and P from 1/15.  This is for continuity of care  Subjective:    Seen at bedside.  Had 3L UF off.  Delirious and a little argumentative- tried to get up out of the gurney- was able to redirect him to lay back down.   Objective:   BP (!) 91/58 (BP Location: Left Arm)   Pulse 86   Temp 98.8 F (37.1 C) (Oral)   Resp 16   Ht 5\' 7"  (1.702 m)   Wt 90.7 kg   SpO2 94%   BMI 31.32 kg/m   Physical Exam: Gen:NAD, intermittently agitated CVS: RRR II VI SEM Resp: coarse basilar breath sounds WVP:XTGG Ext: 2+ bilateral LE edema  Labs: BMET Recent Labs  Lab 07/26/22 1505 07/26/22 1618 07/26/22 2211 07/27/22 0005  NA 135 137 137 136  K 4.1 4.1 4.3 4.5  CL 93*  --   --  94*  CO2 30  --   --  28  GLUCOSE 163*  --   --  116*  BUN 31*  --   --  34*  CREATININE 5.96*  --   --  6.54*  6.51*  CALCIUM 8.9  --   --  8.7*  PHOS  --   --   --  4.8*   CBC Recent Labs  Lab 07/26/22 1505 07/26/22 1618 07/26/22 2211 07/27/22 0005  WBC 14.2*  --   --  11.8*  NEUTROABS 11.0*  --   --   --   HGB 7.7* 8.2* 8.5* 7.7*  HCT 24.1* 24.0* 25.0* 24.0*  MCV 103.4*  --   --  101.3*  PLT 179  --   --  178      Medications:     aspirin EC  81 mg Oral Daily    atorvastatin  80 mg Oral QHS   Chlorhexidine Gluconate Cloth  6 each Topical Q0600   cyanocobalamin  1,000 mcg Oral Daily   darbepoetin (ARANESP) injection - DIALYSIS  150 mcg Subcutaneous Q Mon-1800   docusate sodium  100 mg Oral BID   ezetimibe  10 mg Oral Daily   gabapentin  100 mg Oral QHS   heparin  5,000 Units Subcutaneous Q8H   hydrALAZINE  10 mg Oral Q8H   isosorbide mononitrate  15 mg Oral Daily   melatonin  3 mg Oral QHS   midodrine  10 mg Oral Q M,W,F-HD   multivitamin  1 tablet Oral QHS   pantoprazole  40 mg Oral Daily   polyethylene glycol  17 g Oral Daily   polyvinyl alcohol  2 drop Both Eyes TID   saccharomyces boulardii  250 mg Oral BID   sevelamer carbonate  1,600 mg Oral TID WC   ticagrelor  90 mg Oral BID     Madelon Lips MD 07/27/2022, 11:46 AM

## 2022-07-27 NOTE — Consult Note (Signed)
Renal Service Consult Note [facility]  Rodney Farley 07/27/2022 Rodney Blazing, MD Requesting Physician: Dr. Claria Dice, D.   Reason for Consult: ESRD pt w/ resp distress/ pulm edema HPI: The patient is a 89 y.o. year-old w/ PMH as below who presented to ED with SOB this afternoon. Hx COPD, +sick contacts at the facility where he stays. In ED pt found to be wheezing and rec'd albuterol and atrovent. 87% on RA. BP's 120/80 range, HR 80s, RR 18- 25, temp 99.6. Bipap was used for several hrs and is now on RA. CXR showed mild CHF. Pt is admitted, we are asked to see for dialysis.   Pt seen in ED. States breathing is better after bipap, nebs , O2. Not coughing a lot. Did get into extra fluid this weekend he thinks. No prod cough or high fevers, no CP or abd pain.   ROS - denies CP, no joint pain, no HA, no blurry vision, no rash, no diarrhea, no nausea/ vomiting, no dysuria, no difficulty voiding   Past Medical History  Past Medical History:  Diagnosis Date   AICD (automatic cardioverter/defibrillator) present    [employer] pacer   Anemia    Anxiety and depression    Arthritis    Bell's palsy    left side. after shingles episode   BPH associated with nocturia    Chronic systolic CHF (congestive heart failure) ([place])    EF normalized by Echo 2019   COPD (chronic obstructive pulmonary disease) ([place])    Severe   Coronary artery disease    a. s/p MI in 1994/1995 while in [place] s/p questionable PCI. 03/2015: progression of disease, for staged PCI.   Diabetic peripheral neuropathy (HCC)    GERD (gastroesophageal reflux disease)    Gout    Hard of hearing    B/L   History of chronic pancreatitis 07/23/2017   noted on CT abd/pelvis   History of shingles    Hypercholesterolemia    Hypertension    Obesity    Sleep apnea    "sleeps w/humidifyer when he panics and gets short of breath" (04/08/2015)   TIA (transient ischemic attack) X 3   Trigger middle finger of  left hand    Type II diabetes mellitus ([place])    Wears glasses    Wears hearing aid    Past Surgical History  Past Surgical History:  Procedure Laterality Date   APPENDECTOMY     AV FISTULA PLACEMENT Right 02/07/2019   Procedure: ARTERIOVENOUS (AV) FISTULA CREATION RIGHT UPPER ARM;  Surgeon: Waynetta Sandy, MD;  Location: [place];  Service: Vascular;  Laterality: Right;   BIV ICD GENERATOR CHANGEOUT N/A 10/09/2021   Procedure: BIV ICD GENERATOR CHANGEOUT;  Surgeon: Deboraha Sprang, MD;  Location: [place] [facility];  Service: Cardiovascular;  Laterality: N/A;   CARDIAC CATHETERIZATION N/A 03/29/2015   Procedure: Right/Left Heart Cath and Coronary Angiography;  Surgeon: Atzin M [name], MD;  Location: [place] [facility];  Service: Cardiovascular;  Laterality: N/A;   [place]   "after my MI; put me on heart RX after cath"   CARDIAC CATHETERIZATION N/A 04/09/2015   Procedure: Coronary Stent Intervention;  Surgeon: Rayquon M [name], MD;  Location: [place] [facility];  Service: Cardiovascular;  Laterality: N/A;   CATARACT EXTRACTION W/ INTRAOCULAR LENS  IMPLANT, BILATERAL     CHOLECYSTECTOMY N/A 10/13/2017   Procedure: LAPAROSCOPIC CHOLECYSTECTOMY WITH LYSIS OF ADHESIONS;  Surgeon: Ileana Roup, MD;  Location: [facility];  Service: General;  Laterality: N/A;   COLONOSCOPY     CORONARY ANGIOGRAPHY N/A 11/12/2021   Procedure: CORONARY ANGIOGRAPHY;  Surgeon: Wellington Hampshire, MD;  Location: [place] [facility];  Service: Cardiovascular;  Laterality: N/A;   CORONARY ANGIOGRAPHY N/A 12/24/2021   Procedure: CORONARY ANGIOGRAPHY;  Surgeon: Troy Sine, MD;  Location: [place] [facility];  Service: Cardiovascular;  Laterality: N/A;   CORONARY ANGIOGRAPHY N/A 03/31/2022   Procedure: CORONARY ANGIOGRAPHY;  Surgeon: Jettie Booze, MD;  Location: [place] [facility];  Service: Cardiovascular;  Laterality: N/A;   CORONARY ANGIOGRAPHY N/A 05/07/2022   Procedure: CORONARY  ANGIOGRAPHY;  Surgeon: Jettie Booze, MD;  Location: [place] [facility];  Service: Cardiovascular;  Laterality: N/A;   CORONARY BALLOON ANGIOPLASTY N/A 12/24/2021   Procedure: CORONARY BALLOON ANGIOPLASTY;  Surgeon: Troy Sine, MD;  Location: [place] [facility];  Service: Cardiovascular;  Laterality: N/A;   CORONARY BALLOON ANGIOPLASTY N/A 02/09/2022   Procedure: CORONARY BALLOON ANGIOPLASTY;  Surgeon: Nelva Bush, MD;  Location: [place] [facility];  Service: Cardiovascular;  Laterality: N/A;  [facility]   CORONARY BALLOON ANGIOPLASTY N/A 03/31/2022   Procedure: CORONARY BALLOON ANGIOPLASTY;  Surgeon: Jettie Booze, MD;  Location: [place] [facility];  Service: Cardiovascular;  Laterality: N/A;   CORONARY BALLOON ANGIOPLASTY N/A 05/07/2022   Procedure: CORONARY BALLOON ANGIOPLASTY;  Surgeon: Jettie Booze, MD;  Location: [place] [facility];  Service: Cardiovascular;  Laterality: N/A;   CORONARY STENT INTERVENTION N/A 11/12/2021   Procedure: CORONARY STENT INTERVENTION;  Surgeon: Wellington Hampshire, MD;  Location: [place] [facility];  Service: Cardiovascular;  Laterality: N/A;  [facility]   DENTAL SURGERY     EP IMPLANTABLE DEVICE N/A 09/23/2015   MDT CRT-D, Dr. Caryl Comes   HIATAL HERNIA REPAIR  1977   ILEOCECETOMY N/A 03/27/2017   Procedure: ILEOCECECTOMY;  Surgeon: Ileana Roup, MD;  Location: [place];  Service: General;  Laterality: N/A;   INSERT / REPLACE / REMOVE PACEMAKER  07/2008   Complete heart block status post DDD with good function   INTRAVASCULAR IMAGING/OCT N/A 12/24/2021   Procedure: INTRAVASCULAR IMAGING/OCT;  Surgeon: Troy Sine, MD;  Location: [place] [facility];  Service: Cardiovascular;  Laterality: N/A;   INTRAVASCULAR LITHOTRIPSY  02/09/2022   Procedure: INTRAVASCULAR LITHOTRIPSY;  Surgeon: Nelva Bush, MD;  Location: [place] [facility];  Service: Cardiovascular;;  LAD   INTRAVASCULAR ULTRASOUND/IVUS N/A 02/09/2022   Procedure: Intravascular  Ultrasound/IVUS;  Surgeon: Nelva Bush, MD;  Location: [place] [facility];  Service: Cardiovascular;  Laterality: N/A;  [facility]   IR FLUORO GUIDE CV LINE RIGHT  04/03/2019   IR US GUIDE VASC ACCESS RIGHT  04/03/2019   LAPAROTOMY N/A 03/27/2017   Procedure: EXPLORATORY LAPAROTOMY;  Surgeon: Ileana Roup, MD;  Location: [place];  Service: General;  Laterality: N/A;   ORIF ANKLE FRACTURE Left 06/02/2022   Procedure: OPEN REDUCTION INTERNAL FIXATION (ORIF) LEFT LATERAL MALLEOLUS;  Surgeon: Georgeanna Harrison, MD;  Location: [place];  Service: Orthopedics;  Laterality: Left;   RIGHT HEART CATH AND CORONARY ANGIOGRAPHY N/A 02/09/2022   Procedure: RIGHT HEART CATH AND CORONARY ANGIOGRAPHY;  Surgeon: Nelva Bush, MD;  Location: [place] [facility];  Service: Cardiovascular;  Laterality: N/A;   TONSILLECTOMY     UPPER GI ENDOSCOPY     Family History  Family History  Problem Relation Age of Onset   Stroke Mother    Leukemia Father  Stroke Sister    Heart attack Brother    Social History  reports that he quit smoking about 15 years ago. His smoking use included cigarettes. He has a 81.00 pack-year smoking history. He has never been exposed to tobacco smoke. He has never used smokeless tobacco. He reports current alcohol use. He reports that he does not use drugs. Allergies  Allergies  Allergen Reactions   Bee Venom Anaphylaxis   Lyrica [Pregabalin] Other (See Comments)    hallucinations   Prednisone Other (See Comments)    hallucinations   Zocor [Simvastatin] Nausea Only and Other (See Comments)    Headache with brand name only.  Can take the generic.   Home medications Prior to Admission medications   Medication Sig Start Date End Date Taking? Authorizing Provider  acetaminophen (TYLENOL) 500 MG tablet Take 500 mg by mouth as needed for moderate pain.    [provider]  aspirin EC 81 MG tablet Take 81 mg by mouth daily.    [provider]  atorvastatin (LIPITOR) 80 MG  tablet Take 1 tablet by mouth daily. Patient taking differently: Take 80 mg by mouth at bedtime. 11/14/21   Margie Billet, PA-C  B Complex-C-Folic Acid (NEPHRO VITAMINS) 0.8 MG TABS Take 1 tablet by mouth daily.    [provider]  calcitRIOL (ROCALTROL) 0.25 MCG capsule Take 7 capsules (1.75 mcg total) by mouth every Monday, Wednesday, and Friday with hemodialysis. Patient taking differently: Take by mouth every Monday, Wednesday, and Friday with hemodialysis. 01/26/22   [name], Concepcion M, MD  carbamide peroxide (DEBROX) 6.5 % OTIC solution Place 5 drops into both ears 2 (two) times daily as needed (earwax). Patient taking differently: Place 5 drops into both ears as needed (earwax). 11/13/21   Lelon Perla, MD  Carboxymethylcellul-Glycerin (LUBRICATING EYE DROPS OP) Place 2-3 drops into both eyes in the morning, at noon, and at bedtime.    [provider]  carvedilol (COREG) 3.125 MG tablet Take 1 tablet (3.125 mg total) by mouth 2 (two) times daily with a meal. Hold for SBP <100 mmhg OR hr < 60/min Patient taking differently: Take 3.125 mg by mouth See admin instructions. Take 3.125 twice daily on Tuesday, Thursday, Saturday, and Sunday. Take 3.125 mg in the evening on Monday, Wednesday, Friday. 05/08/22 06/07/22  Antonieta Pert, MD  docusate sodium (COLACE) 100 MG capsule Take 1 capsule (100 mg total) by mouth 2 (two) times daily. 06/09/22   Barb Merino, MD  Evolocumab (REPATHA SURECLICK) 161 MG/ML SOAJ Inject 140 mg into the skin every 14 (fourteen) days. 04/22/22   [name], Ramiro M, MD  ezetimibe (ZETIA) 10 MG tablet Take 1 tablet (10 mg total) by mouth daily. 05/08/22   Antonieta Pert, MD  gabapentin (NEURONTIN) 100 MG capsule Take 1 capsule (100 mg total) by mouth at bedtime. 06/09/22   Barb Merino, MD  glucose blood (FREESTYLE TEST STRIPS) test strip Use to check blood sugar daily 05/01/19   [name], MD  glucose monitoring kit (FREESTYLE) monitoring kit USE TO  MONITOR BLOOD GLUCOSE AS DIRECTED 05/01/19   [name], MD  HYDROcodone-acetaminophen (NORCO/VICODIN) 5-325 MG tablet Take 1 tablet by mouth every 6 (six) hours as needed for severe pain (postoperative pain). 06/03/22 06/03/23  Georgeanna Harrison, MD  isosorbide mononitrate (IMDUR) 30 MG 24 hr tablet Take 1/2 tablet (15 mg total) by mouth daily. 05/09/22 06/08/22  Antonieta Pert, MD  melatonin 3 MG TABS tablet Take 1 tablet (3 mg total)  by mouth at bedtime. 06/09/22   Barb Merino, MD  midodrine (PROAMATINE) 10 MG tablet Take 0.5-1 tablets (5-10 mg total) by mouth See admin instructions. Take 1 tablet (10 mg) three times a week. Add 5 mg a night after dialysis as needed for low blood pressure. Patient taking differently: Take 5-10 mg by mouth See admin instructions. Take 1 tablet (10 mg) three times a week on dialysis days. May take 5 mg a night after dialysis as needed for low blood pressure. 04/02/22   Aline August, MD  multivitamin (RENA-VIT) TABS tablet Take 1 tablet by mouth daily. 08/22/21   [name], MD  nitroGLYCERIN (NITROSTAT) 0.4 MG SL tablet Place 1 tablet under the tongue every 5 minutes x 3 doses as needed for chest pain. 11/13/21   Margie Billet, PA-C  oxymetazoline (AFRIN) 0.05 % nasal spray Place 1 spray into both nostrils 2 (two) times daily as needed for congestion. Patient taking differently: Place 3-4 sprays into both nostrils as needed (nose bleeds). 12/29/21   [name], MD  pantoprazole (PROTONIX) 40 MG tablet Take 1 tablet by mouth daily. 11/14/21   Margie Billet, PA-C  saccharomyces boulardii (FLORASTOR) 250 MG capsule Take 1 capsule (250 mg total) by mouth 2 (two) times daily. Patient taking differently: Take 250 mg by mouth every evening. 04/08/19   Regalado, Belkys A, MD  sevelamer carbonate (RENVELA) 800 MG tablet Take 2 tablets (1,600 mg total) by mouth 3 (three) times daily with meals. Patient taking differently: Take 800-2,400 mg by mouth See  admin instructions. Take 2400 mg  by mouth twice a day. Take [phone] mg by mouth with snacks. 11/13/21   Lelon Perla, MD  ticagrelor (BRILINTA) 90 MG TABS tablet Take 1 tablet (90 mg total) by mouth 2 (two) times daily. 05/08/22   Antonieta Pert, MD  vitamin B-12 (CYANOCOBALAMIN) 1000 MCG tablet Take 1,000 mcg by mouth daily.    [provider]     Vitals:   07/26/22 2000 07/26/22 2030 07/26/22 2142 07/26/22 2230  BP: (!) 120/56 (!) 134/52    Pulse: 80 80 86 83  Resp: 19 20 (!) 32 (!) 29  Temp:      TempSrc:      SpO2: 100% 100% 99% 96%  Weight:      Height:       Exam Gen alert, no distress No rash, cyanosis or gangrene Sclera anicteric, throat clear  No jvd or bruits Chest fair air moving, +bibasilar fine crackles, no wheezing RRR no MRG Abd soft ntnd no mass or ascites +bs GU nl male MS no joint effusions or deformity Ext bilat 1+ pitting pretib edema Neuro is alert, Ox 3 , nf    RUA AVF+bruit     Home meds include - asa, lipitor, coreg 3.125 bid, zetia, neurontin 100 hs, norco prn, imdur 15 qd, midodrine 10 mg pre HD mwf, renavite, protonix, renvela 2 ac tid, ticagrelor, prns/ vits/ supps    OP HD: [facility] MWF  4h  400/1.5  94kg  2/2 bath  AVF  Hep 2500+ 1066midrun - venofer 100mg  tiw thru 1/24 - mircera 150 mcg q2, last 12/31, due 1/14 (today) - last HD 1/12, post wt 95.9kg   - dry wt recently raised 93 > 94kg  Assessment/ Plan: SOB/ resp distress - w/ bronchspasm per ED notes, also mild CHF by xray. +LE edema. Plan is for HD tonight w/ max UF 3-4 L. Will reassess in am. ESRD -  on HD MWF. Has not missed HD. HD tonight as above.  HTN/ volume - BP's wnl, vol excess as above.  Anemia esrd - Hb 7.7 here, due for esa today --> will order darbe 150 mcg sq weekly (on Mondays).  MBD ckd - Ca in range, add on phos/ albumin.  CAD / sp AICD     Kelly Splinter  MD 07/27/2022, 12:20 AM Recent Labs  Lab 07/26/22 1505 07/26/22 1618 07/26/22 2211  HGB 7.7* 8.2* 8.5*   CALCIUM 8.9  --   --   CREATININE 5.96*  --   --   K 4.1 4.1 4.3   Inpatient medications:  aspirin EC  81 mg Oral Daily   atorvastatin  80 mg Oral QHS   calcitRIOL  1.75 mcg Oral Q M,W,F-HD   carboxymethylcellul-glycerin  2 drop Both Eyes TID   carvedilol  3.125 mg Oral BID WC   Chlorhexidine Gluconate Cloth  6 each Topical Q0600   cyanocobalamin  1,000 mcg Oral Daily   docusate sodium  100 mg Oral BID   ezetimibe  10 mg Oral Daily   gabapentin  100 mg Oral QHS   heparin  5,000 Units Subcutaneous Q8H   isosorbide mononitrate  15 mg Oral Daily   melatonin  3 mg Oral QHS   midodrine  10 mg Oral See admin instructions   multivitamin  1 tablet Oral Daily   pantoprazole  40 mg Oral Daily   polyethylene glycol  17 g Oral Daily   saccharomyces boulardii  250 mg Oral BID   sevelamer carbonate  1,600 mg Oral TID WC   ticagrelor  90 mg Oral BID    promethazine (PHENERGAN) injection (IM or IVPB)     acetaminophen **OR** acetaminophen, heparin, HYDROcodone-acetaminophen, nitroGLYCERIN, promethazine (PHENERGAN) injection (IM or IVPB), senna-docusate

## 2022-07-27 NOTE — Progress Notes (Signed)
Heart Failure Navigator Progress Note  Assessed for Heart & Vascular TOC clinic readiness.  Patient does not meet criteria due to ESRD on hemodialysis.   Navigator will sign off at this time.    Makayli Bracken, BSN, RN Heart Failure Nurse Navigator Secure Chat Only   

## 2022-07-27 NOTE — ED Notes (Signed)
ED TO INPATIENT HANDOFF REPORT  ED Nurse Name and Phone #: Leodis Liverpool  S Name/Age/Gender Rodney Farley 89 y.o. male Room/Bed: 013C/013C  Code Status   Code Status: DNR  Home/SNF/Other Nursing Home Patient oriented to: self, place, time, and situation Is this baseline? Yes   Triage Complete: Triage complete  Chief Complaint Acute on chronic systolic heart failure (HCC) [I50.23] Acute respiratory distress [R06.03] Bilateral pleural effusion [J90] Acute on chronic combined systolic and diastolic congestive heart failure ([place]) [I50.43] Anemia, unspecified type [D64.9]  Triage Note BIB [facility] from [facility]. Pt presents with ShOB that started this AM while sitting at desk. EMS report severely diminished lung sounds that has improved after 10mg  of Albuterol and 500 mcg of Atrovent. + sick contacts at facility. Pt was 87% on R/A prior to EMS arrival. Pt with Hx COPD, CHF, hypotension, and dialysis.    EMS Vitals  Temp 99.8 90/50 HR 80 and paced   Allergies Allergies  Allergen Reactions   Bee Venom Anaphylaxis   Lyrica [Pregabalin] Other (See Comments)    hallucinations   Prednisone Other (See Comments)    hallucinations   Zocor [Simvastatin] Nausea Only and Other (See Comments)    Headache with brand name only.  Can take the generic.    Level of Care/Admitting Diagnosis ED Disposition     ED Disposition  Admit   Condition  --   [place]: [facility] [[id number]]  Level of Care: Telemetry Medical [104]  May admit patient to [facility] or [facility] if equivalent level of care is available:: Yes  Covid Evaluation: Confirmed COVID Negative  Diagnosis: Acute on chronic systolic heart failure ([place]) [428.23.ICD-9-CM]  Admitting Physician: Quintella Baton [[id number]]  Attending Physician: Quintella Baton [[id number]]  Certification:: I certify this patient will need inpatient services for at least 2 midnights  Estimated Length of Stay:  2          B Medical/Surgery History Past Medical History:  Diagnosis Date   AICD (automatic cardioverter/defibrillator) present    [employer] pacer   Anemia    Anxiety and depression    Arthritis    Bell's palsy    left side. after shingles episode   BPH associated with nocturia    Chronic systolic CHF (congestive heart failure) ([place])    EF normalized by Echo 2019   COPD (chronic obstructive pulmonary disease) ([place])    Severe   Coronary artery disease    a. s/p MI in 1994/1995 while in [place] s/p questionable PCI. 03/2015: progression of disease, for staged PCI.   Diabetic peripheral neuropathy (HCC)    GERD (gastroesophageal reflux disease)    Gout    Hard of hearing    B/L   History of chronic pancreatitis 07/23/2017   noted on CT abd/pelvis   History of shingles    Hypercholesterolemia    Hypertension    Obesity    Sleep apnea    "sleeps w/humidifyer when he panics and gets short of breath" (04/08/2015)   TIA (transient ischemic attack) X 3   Trigger middle finger of left hand    Type II diabetes mellitus ([place])    Wears glasses    Wears hearing aid    Past Surgical History:  Procedure Laterality Date   APPENDECTOMY     AV FISTULA PLACEMENT Right 02/07/2019   Procedure: ARTERIOVENOUS (AV) FISTULA CREATION RIGHT UPPER ARM;  Surgeon: Waynetta Sandy, MD;  Location: [facility];  Service: Vascular;  Laterality: Right;   BIV ICD GENERATOR CHANGEOUT N/A 10/09/2021   Procedure: BIV ICD GENERATOR CHANGEOUT;  Surgeon: Deboraha Sprang, MD;  Location: [facility];  Service: Cardiovascular;  Laterality: N/A;   CARDIAC CATHETERIZATION N/A 03/29/2015   Procedure: Right/Left Heart Cath and Coronary Angiography;  Surgeon: Daksh M [name], MD;  Location: [facility];  Service: Cardiovascular;  Laterality: N/A;   [place]   "after my MI; put me on heart RX after cath"   CARDIAC CATHETERIZATION N/A 04/09/2015   Procedure: Coronary Stent  Intervention;  Surgeon: Xzavior M [name], MD;  Location: [facility];  Service: Cardiovascular;  Laterality: N/A;   CATARACT EXTRACTION W/ INTRAOCULAR LENS  IMPLANT, BILATERAL     CHOLECYSTECTOMY N/A 10/13/2017   Procedure: LAPAROSCOPIC CHOLECYSTECTOMY WITH LYSIS OF ADHESIONS;  Surgeon: Ileana Roup, MD;  Location[facility];  Service: General;  Laterality: N/A;   COLONOSCOPY     CORONARY ANGIOGRAPHY N/A 11/12/2021   Procedure: CORONARY ANGIOGRAPHY;  Surgeon: Wellington Hampshire, MD;  Location: [facility];  Service: Cardiovascular;  Laterality: N/A;   CORONARY ANGIOGRAPHY N/A 12/24/2021   Procedure: CORONARY ANGIOGRAPHY;  Surgeon: Troy Sine, MD;  Location: [facility];  Service: Cardiovascular;  Laterality: N/A;   CORONARY ANGIOGRAPHY N/A 03/31/2022   Procedure: CORONARY ANGIOGRAPHY;  Surgeon: Jettie Booze, MD;  Location: [facility];  Service: Cardiovascular;  Laterality: N/A;   CORONARY ANGIOGRAPHY N/A 05/07/2022   Procedure: CORONARY ANGIOGRAPHY;  Surgeon: Jettie Booze, MD;  Location: [facility];  Service: Cardiovascular;  Laterality: N/A;   CORONARY BALLOON ANGIOPLASTY N/A 12/24/2021   Procedure: CORONARY BALLOON ANGIOPLASTY;  Surgeon: Troy Sine, MD;  Location: [facility];  Service: Cardiovascular;  Laterality: N/A;   CORONARY BALLOON ANGIOPLASTY N/A 02/09/2022   Procedure: CORONARY BALLOON ANGIOPLASTY;  Surgeon: Nelva Bush, MD;  Location: [facility];  Service: Cardiovascular;  Laterality: N/A;  LAD   CORONARY BALLOON ANGIOPLASTY N/A 03/31/2022   Procedure: CORONARY BALLOON ANGIOPLASTY;  Surgeon: Jettie Booze, MD;  Location: [facility];  Service: Cardiovascular;  Laterality: N/A;   CORONARY BALLOON ANGIOPLASTY N/A 05/07/2022   Procedure: CORONARY BALLOON ANGIOPLASTY;  Surgeon: Jettie Booze, MD;  Location: [facility];  Service: Cardiovascular;  Laterality: N/A;   CORONARY STENT  INTERVENTION N/A 11/12/2021   Procedure: CORONARY STENT INTERVENTION;  Surgeon: Wellington Hampshire, MD;  Location: [facility];  Service: Cardiovascular;  Laterality: N/A;  LAD   DENTAL SURGERY     EP IMPLANTABLE DEVICE N/A 09/23/2015   MDT CRT-D, Dr. Caryl Comes   HIATAL HERNIA REPAIR  1977   ILEOCECETOMY N/A 03/27/2017   Procedure: ILEOCECECTOMY;  Surgeon: Ileana Roup, MD;  Location: [facility];  Service: General;  Laterality: N/A;   INSERT / REPLACE / REMOVE PACEMAKER  07/2008   Complete heart block status post DDD with good function   INTRAVASCULAR IMAGING/OCT N/A 12/24/2021   Procedure: INTRAVASCULAR IMAGING/OCT;  Surgeon: Troy Sine, MD;  Location: [facility];  Service: Cardiovascular;  Laterality: N/A;   INTRAVASCULAR LITHOTRIPSY  02/09/2022   Procedure: INTRAVASCULAR LITHOTRIPSY;  Surgeon: Nelva Bush, MD;  Location: [facility];  Service: Cardiovascular;;  LAD   INTRAVASCULAR ULTRASOUND/IVUS N/A 02/09/2022   Procedure: Intravascular Ultrasound/IVUS;  Surgeon: Nelva Bush, MD;  Location: [facility];  Service: Cardiovascular;  Laterality: N/A;  LAD   IR  FLUORO GUIDE CV LINE RIGHT  04/03/2019   IR US GUIDE VASC ACCESS RIGHT  04/03/2019   LAPAROTOMY N/A 03/27/2017   Procedure: EXPLORATORY LAPAROTOMY;  Surgeon: Ileana Roup, MD;  Location: [facility];  Service: General;  Laterality: N/A;   ORIF ANKLE FRACTURE Left 06/02/2022   Procedure: OPEN REDUCTION INTERNAL FIXATION (ORIF) LEFT LATERAL MALLEOLUS;  Surgeon: Georgeanna Harrison, MD;  Location: [facility];  Service: Orthopedics;  Laterality: Left;   RIGHT HEART CATH AND CORONARY ANGIOGRAPHY N/A 02/09/2022   Procedure: RIGHT HEART CATH AND CORONARY ANGIOGRAPHY;  Surgeon: Nelva Bush, MD;  Location: [facility];  Service: Cardiovascular;  Laterality: N/A;   TONSILLECTOMY     UPPER GI ENDOSCOPY       A IV Location/Drains/Wounds Patient Lines/Drains/Airways Status     Active Line/Drains/Airways      Name Placement date Placement time Site Days   Peripheral IV 07/26/22 20 G 1.88" Left Antecubital 07/26/22  1533  Antecubital  1   Fistula / Graft Right Upper arm Arteriovenous fistula 02/07/19  0753  Upper arm  1266   Incision (Closed) 06/02/22 Leg Left 06/02/22  1747  -- 55   Pressure Injury 06/08/22 Buttocks Right;Left Stage 2 -  Partial thickness loss of dermis presenting as a shallow open injury with a red, pink wound bed without slough. 06/08/22  0835  -- 49   Pressure Injury 06/08/22 Buttocks Left;Right Stage 2 -  Partial thickness loss of dermis presenting as a shallow open injury with a red, pink wound bed without slough. 06/08/22  0836  -- 49   Wound / Incision (Open or Dehisced) 05/07/22 Other (Comment) Foot Right;Posterior 05/07/22  0130  Foot  81            Intake/Output Last 24 hours  Intake/Output Summary (Last 24 hours) at 07/27/2022 1708 Last data filed at 07/27/2022 0439 Gross per 24 hour  Intake --  Output 3500 ml  Net -3500 ml    Labs/Imaging Results for orders placed or performed during the hospital encounter of 07/26/22 (from the past 48 hour(s))  CBC with Differential     Status: Abnormal   Collection Time: 07/26/22  3:05 PM  Result Value Ref Range   WBC 14.2 (H) 4.0 - 10.5 K/uL   RBC 2.33 (L) 4.22 - 5.81 MIL/uL   Hemoglobin 7.7 (L) 13.0 - 17.0 g/dL   HCT 24.1 (L) 39.0 - 52.0 %   MCV 103.4 (H) 80.0 - 100.0 fL   MCH 33.0 26.0 - 34.0 pg   MCHC 32.0 30.0 - 36.0 g/dL   RDW 17.2 (H) 11.5 - 15.5 %   Platelets 179 150 - 400 K/uL   nRBC 0.0 0.0 - 0.2 %   Neutrophils Relative % 78 %   Neutro Abs 11.0 (H) 1.7 - 7.7 K/uL   Lymphocytes Relative 14 %   Lymphs Abs 1.9 0.7 - 4.0 K/uL   Monocytes Relative 7 %   Monocytes Absolute 1.0 0.1 - 1.0 K/uL   Eosinophils Relative 1 %   Eosinophils Absolute 0.2 0.0 - 0.5 K/uL   Basophils Relative 0 %   Basophils Absolute 0.0 0.0 - 0.1 K/uL   Immature Granulocytes 0 %   Abs Immature Granulocytes 0.05 0.00 - 0.07 K/uL     Comment: Performed at [facility], [address], [place], [place] [address]  Basic metabolic panel     Status: Abnormal   Collection Time: 07/26/22  3:05 PM  Result Value Ref  Range   Sodium 135 135 - 145 mmol/L   Potassium 4.1 3.5 - 5.1 mmol/L   Chloride 93 (L) 98 - 111 mmol/L   CO2 30 22 - 32 mmol/L   Glucose, Bld 163 (H) 70 - 99 mg/dL    Comment: Glucose reference range applies only to samples taken after fasting for at least 8 hours.   BUN 31 (H) 8 - 23 mg/dL   Creatinine, Ser 5.96 (H) 0.61 - 1.24 mg/dL   Calcium 8.9 8.9 - 10.3 mg/dL   GFR, Estimated 8 (L) >60 mL/min    Comment: (NOTE) Calculated using the CKD-EPI Creatinine Equation (2021)    Anion gap 12 5 - 15    Comment: Performed at [facility] [address], [place], [place] [address]  Brain natriuretic peptide     Status: Abnormal   Collection Time: 07/26/22  3:05 PM  Result Value Ref Range   B Natriuretic Peptide 1,410.5 (H) 0.0 - 100.0 pg/mL    Comment: Performed at [facility] [address], [place], [place] [address]  Troponin I (High Sensitivity)     Status: Abnormal   Collection Time: 07/26/22  3:05 PM  Result Value Ref Range   Troponin I (High Sensitivity) 1,379 (HH) <18 ng/L    Comment: CRITICAL RESULT CALLED TO, READ BACK BY AND VERIFIED WITH A,BROWN RN @1652  07/26/22 E,[place] (NOTE) Elevated high sensitivity troponin I (hsTnI) values and significant  changes across serial measurements may suggest ACS but many other  chronic and acute conditions are known to elevate hsTnI results.  Refer to the "Links" section for chest pain algorithms and additional  guidance. Performed at [facility], [place] [address], [place], [place] [address]   Blood culture (routine x 2)     Status: None (Preliminary result)   Collection Time: 07/26/22  3:17 PM   Specimen: BLOOD LEFT FOREARM  Result Value Ref Range   Specimen Description BLOOD LEFT FOREARM    Special Requests      BOTTLES DRAWN  AEROBIC AND ANAEROBIC Blood Culture adequate volume   Culture      NO GROWTH < 24 HOURS Performed at [facility], [place] [address], [place], [place] [address]    Report Status PENDING   D-dimer, quantitative     Status: Abnormal   Collection Time: 07/26/22  3:26 PM  Result Value Ref Range   D-Dimer, Quant 1.20 (H) 0.00 - 0.50 ug/mL-FEU    Comment: (NOTE) At the manufacturer cut-off value of 0.5 g/mL FEU, this assay has a negative predictive value of 95-100%.This assay is intended for use in conjunction with a clinical pretest probability (PTP) assessment model to exclude pulmonary embolism (PE) and deep venous thrombosis (DVT) in outpatients suspected of PE or DVT. Results should be correlated with clinical presentation. Performed at [facility], [place] [address] [place], [place] [address]   Lactic acid, plasma     Status: Abnormal   Collection Time: 07/26/22  3:26 PM  Result Value Ref Range   Lactic Acid, Venous 2.8 (HH) 0.5 - 1.9 mmol/L    Comment: CRITICAL RESULT CALLED TO, READ BACK BY AND VERIFIED WITH A,BROWN RN @1650  07/26/22 E,[place]ENT[place] Performed at [facility], [place] [address], [place], [place] [address]   Magnesium     Status: None   Collection Time: 07/26/22  3:26 PM  Result Value Ref Range   Magnesium 2.1 1.7 - 2.4 mg/dL  Comment: Performed at [facility], [place] [address], [place], [place] [address]  Resp panel by RT-PCR (RSV, Flu A&B, Covid) Nasopharyngeal Swab     Status: None   Collection Time: 07/26/22  4:14 PM   Specimen: Nasopharyngeal Swab; Nasal Swab  Result Value Ref Range   SARS Coronavirus 2 by RT PCR NEGATIVE NEGATIVE    Comment: (NOTE) SARS-CoV-2 target nucleic acids are NOT DETECTED.  The SARS-CoV-2 RNA is generally detectable in upper respiratory specimens during the acute phase of infection. The lowest concentration of SARS-CoV-2 viral copies this assay can detect is 138 copies/mL. A negative result does not preclude  SARS-Cov-2 infection and should not be used as the sole basis for treatment or other patient management decisions. A negative result may occur with  improper specimen collection/handling, submission of specimen other than nasopharyngeal swab, presence of viral mutation(s) within the areas targeted by this assay, and inadequate number of viral copies(<138 copies/mL). A negative result must be combined with clinical observations, patient history, and epidemiological information. The expected result is Negative.  Fact Sheet for Patients:  EntrepreneurPulse.com.au  Fact Sheet for Healthcare Providers:  IncredibleEmployment.be  This test is no t yet approved or cleared by the [place] FDA and  has been authorized for detection and/or diagnosis of SARS-CoV-2 by FDA under an Emergency Use Authorization (EUA). This EUA will remain  in effect (meaning this test can be used) for the duration of the COVID-19 declaration under Section 564(b)(1) of the Act, 21 U.S.C.section 360bbb-3(b)(1), unless the authorization is terminated  or revoked sooner.       Influenza A by PCR NEGATIVE NEGATIVE   Influenza B by PCR NEGATIVE NEGATIVE    Comment: (NOTE) The Xpert Xpress SARS-CoV-2/FLU/RSV plus assay is intended as an aid in the diagnosis of influenza from Nasopharyngeal swab specimens and should not be used as a sole basis for treatment. Nasal washings and aspirates are unacceptable for Xpert Xpress SARS-CoV-2/FLU/RSV testing.  Fact Sheet for Patients: EntrepreneurPulse.com.au  Fact Sheet for Healthcare Providers: IncredibleEmployment.be  This test is not yet approved or cleared by the [place] FDA and has been authorized for detection and/or diagnosis of SARS-CoV-2 by FDA under an Emergency Use Authorization (EUA). This EUA will remain in effect (meaning this test can be used) for the duration of the COVID-19  declaration under Section 564(b)(1) of the Act, 21 U.S.C. section 360bbb-3(b)(1), unless the authorization is terminated or revoked.     Resp Syncytial Virus by PCR NEGATIVE NEGATIVE    Comment: (NOTE) Fact Sheet for Patients: EntrepreneurPulse.com.au  Fact Sheet for Healthcare Providers: IncredibleEmployment.be  This test is not yet approved or cleared by the [place] FDA and has been authorized for detection and/or diagnosis of SARS-CoV-2 by FDA under an Emergency Use Authorization (EUA). This EUA will remain in effect (meaning this test can be used) for the duration of the COVID-19 declaration under Section 564(b)(1) of the Act, 21 U.S.C. section 360bbb-3(b)(1), unless the authorization is terminated or revoked.  Performed at [facility], [place] [address], [place], [place] [address]   I-Stat venous blood gas, [facility] ED, [facility], [facility])     Status: Abnormal   Collection Time: 07/26/22  4:18 PM  Result Value Ref Range   pH, Ven 7.365 7.25 - 7.43   pCO2, Ven 55.5 44 - 60 mmHg   pO2, Ven 69 (H) 32 - 45 mmHg   Bicarbonate 31.8 (H) 20.0 - 28.0 mmol/L   TCO2 33 (H) 22 -  32 mmol/L   O2 Saturation 92 %   Acid-Base Excess 6.0 (H) 0.0 - 2.0 mmol/L   Sodium 137 135 - 145 mmol/L   Potassium 4.1 3.5 - 5.1 mmol/L   Calcium, Ion 1.16 1.15 - 1.40 mmol/L   HCT 24.0 (L) 39.0 - 52.0 %   Hemoglobin 8.2 (L) 13.0 - 17.0 g/dL   Sample type VENOUS   Troponin I (High Sensitivity)     Status: Abnormal   Collection Time: 07/26/22  5:05 PM  Result Value Ref Range   Troponin I (High Sensitivity) 1,334 (HH) <18 ng/L    Comment: CRITICAL VALUE NOTED. VALUE IS CONSISTENT WITH PREVIOUSLY REPORTED/CALLED VALUE (NOTE) Elevated high sensitivity troponin I (hsTnI) values and significant  changes across serial measurements may suggest ACS but many other  chronic and acute conditions are known to elevate hsTnI results.  Refer to the "Links" section for chest pain  algorithms and additional  guidance. Performed at [facility], [place] [address], [place], [place] [address]   Lactic acid, plasma     Status: Abnormal   Collection Time: 07/26/22  7:34 PM  Result Value Ref Range   Lactic Acid, Venous 2.2 (HH) 0.5 - 1.9 mmol/L    Comment: CRITICAL VALUE NOTED. VALUE IS CONSISTENT WITH PREVIOUSLY REPORTED/CALLED VALUE Performed at [facility], [place] [address], [place], [place] [address]   I-Stat venous blood gas, [facility] ED, [facility], [facility])     Status: Abnormal   Collection Time: 07/26/22 10:11 PM  Result Value Ref Range   pH, Ven 7.377 7.25 - 7.43   pCO2, Ven 54.1 44 - 60 mmHg   pO2, Ven 42 32 - 45 mmHg   Bicarbonate 31.7 (H) 20.0 - 28.0 mmol/L   TCO2 33 (H) 22 - 32 mmol/L   O2 Saturation 76 %   Acid-Base Excess 6.0 (H) 0.0 - 2.0 mmol/L   Sodium 137 135 - 145 mmol/L   Potassium 4.3 3.5 - 5.1 mmol/L   Calcium, Ion 1.16 1.15 - 1.40 mmol/L   HCT 25.0 (L) 39.0 - 52.0 %   Hemoglobin 8.5 (L) 13.0 - 17.0 g/dL   Sample type VENOUS   CBC     Status: Abnormal   Collection Time: 07/27/22 12:05 AM  Result Value Ref Range   WBC 11.8 (H) 4.0 - 10.5 K/uL   RBC 2.37 (L) 4.22 - 5.81 MIL/uL   Hemoglobin 7.7 (L) 13.0 - 17.0 g/dL   HCT 24.0 (L) 39.0 - 52.0 %   MCV 101.3 (H) 80.0 - 100.0 fL   MCH 32.5 26.0 - 34.0 pg   MCHC 32.1 30.0 - 36.0 g/dL   RDW 17.3 (H) 11.5 - 15.5 %   Platelets 178 150 - 400 K/uL   nRBC 0.0 0.0 - 0.2 %    Comment: Performed at [facility], [address] [place], [place] [address]  Creatinine, serum     Status: Abnormal   Collection Time: 07/27/22 12:05 AM  Result Value Ref Range   Creatinine, Ser 6.51 (H) 0.61 - 1.24 mg/dL   GFR, Estimated 8 (L) >60 mL/min    Comment: (NOTE) Calculated using the CKD-EPI Creatinine Equation (2021) Performed at [facility] [address], [place], [place] [address]   Basic metabolic panel     Status: Abnormal   Collection Time: 07/27/22 12:05 AM  Result Value Ref Range   Sodium  136 135 - 145 mmol/L   Potassium 4.5 3.5 - 5.1  mmol/L   Chloride 94 (L) 98 - 111 mmol/L   CO2 28 22 - 32 mmol/L   Glucose, Bld 116 (H) 70 - 99 mg/dL    Comment: Glucose reference range applies only to samples taken after fasting for at least 8 hours.   BUN 34 (H) 8 - 23 mg/dL   Creatinine, Ser 6.54 (H) 0.61 - 1.24 mg/dL   Calcium 8.7 (L) 8.9 - 10.3 mg/dL   GFR, Estimated 8 (L) >60 mL/min    Comment: (NOTE) Calculated using the CKD-EPI Creatinine Equation (2021)    Anion gap 14 5 - 15    Comment: Performed at [facility] [address] [place], [place] [address]  Magnesium     Status: None   Collection Time: 07/27/22 12:05 AM  Result Value Ref Range   Magnesium 2.1 1.7 - 2.4 mg/dL    Comment: Performed at [facility], [place] [address], [place], [place] [address]  Phosphorus     Status: Abnormal   Collection Time: 07/27/22 12:05 AM  Result Value Ref Range   Phosphorus 4.8 (H) 2.5 - 4.6 mg/dL    Comment: Performed at [facility] [address] [place], [place] [address]  Albumin     Status: Abnormal   Collection Time: 07/27/22 12:05 AM  Result Value Ref Range   Albumin 3.0 (L) 3.5 - 5.0 g/dL    Comment: Performed at [facility], [place] [address] [place], [place] [address]   *Note: Due to a large number of results and/or encounters for the requested time period, some results have not been displayed. A complete set of results can be found in Results Review.   ECHOCARDIOGRAM COMPLETE  Result Date: 07/27/2022    ECHOCARDIOGRAM REPORT   Patient Name:   Rodney Farley Date of Exam: 07/27/2022 Medical Rec #:  [id number]             Height:       67.0 in Accession #:    [id number]            Weight:       200.0 lb Date of Birth:  Feb 17, 1932             BSA:          2.022 m Patient Age:    27 years              BP:           95/46 mmHg Patient Gender: M                     HR:           90 bpm. Exam Location:  Inpatient Procedure: 2D Echo, Color Doppler,  Cardiac Doppler and Intracardiac            Opacification Agent Indications:    M42.68 Acute systolic (congestive) heart failure  History:        Patient has prior history of Echocardiogram examinations, most                 recent 05/07/2022. CHF, Pacemaker and Defibrillator, COPD and                 ESRD, Arrythmias:Atrial Fibrillation and AV Block; Risk                 Factors:Hypertension, Diabetes, Dyslipidemia and Sleep Apnea.  Sonographer:    [place] Referring  Phys: [id number] [place]  1. Septal apical akinesis distal anterior wall and inferior apical hypokinesis . Left ventricular ejection fraction, by estimation, is 40 to 45%. The left ventricle has mildly decreased function. The left ventricle has no regional wall motion abnormalities. The left ventricular internal cavity size was moderately dilated. Left ventricular diastolic parameters are consistent with Grade II diastolic dysfunction (pseudonormalization). Elevated left ventricular end-diastolic pressure.  2. Cathers in RA/RV . Right ventricular systolic function is normal. The right ventricular size is normal. There is moderately elevated pulmonary artery systolic pressure.  3. Left atrial size was severely dilated.  4. The mitral valve is abnormal. Mild mitral valve regurgitation. No evidence of mitral stenosis. Severe mitral annular calcification.  5. The aortic valve is tricuspid. There is moderate calcification of the aortic valve. There is moderate thickening of the aortic valve. Aortic valve regurgitation is not visualized. Moderate aortic valve stenosis.  6. The inferior vena cava is dilated in size with <50% respiratory variability, suggesting right atrial pressure of 15 mmHg. FINDINGS  Left Ventricle: Septal apical akinesis distal anterior wall and inferior apical hypokinesis. Left ventricular ejection fraction, by estimation, is 40 to 45%. The left ventricle has mildly decreased function. The left ventricle  has no regional wall motion abnormalities. Definity contrast agent was given IV to delineate the left ventricular endocardial borders. The left ventricular internal cavity size was moderately dilated. There is no left ventricular hypertrophy. Left ventricular diastolic parameters are consistent with Grade II diastolic dysfunction (pseudonormalization). Elevated left ventricular end-diastolic pressure. Right Ventricle: Cathers in RA/RV. The right ventricular size is normal. No increase in right ventricular wall thickness. Right ventricular systolic function is normal. There is moderately elevated pulmonary artery systolic pressure. The tricuspid regurgitant velocity is 2.80 m/s, and with an assumed right atrial pressure of 15 mmHg, the estimated right ventricular systolic pressure is 19.1 mmHg. Left Atrium: Left atrial size was severely dilated. Right Atrium: Right atrial size was normal in size. Pericardium: There is no evidence of pericardial effusion. Mitral Valve: The mitral valve is abnormal. There is moderate thickening of the mitral valve leaflet(s). There is moderate calcification of the mitral valve leaflet(s). Severe mitral annular calcification. Mild mitral valve regurgitation. No evidence of mitral valve stenosis. MV peak gradient, 7.6 mmHg. The mean mitral valve gradient is 3.0 mmHg. Tricuspid Valve: The tricuspid valve is normal in structure. Tricuspid valve regurgitation is mild . No evidence of tricuspid stenosis. Aortic Valve: The aortic valve is tricuspid. There is moderate calcification of the aortic valve. There is moderate thickening of the aortic valve. Aortic valve regurgitation is not visualized. Moderate aortic stenosis is present. Aortic valve mean gradient measures 20.0 mmHg. Aortic valve peak gradient measures 31.8 mmHg. Aortic valve area, by VTI measures 1.20 cm. Pulmonic Valve: The pulmonic valve was normal in structure. Pulmonic valve regurgitation is mild. No evidence of pulmonic  stenosis. Aorta: The aortic root is normal in size and structure. Venous: The inferior vena cava is dilated in size with less than 50% respiratory variability, suggesting right atrial pressure of 15 mmHg. IAS/Shunts: No atrial level shunt detected by color flow Doppler.  LEFT VENTRICLE PLAX 2D LVIDd:         5.70 cm      Diastology LVIDs:         4.40 cm      LV e' medial:    6.37 cm/s LV PW:         0.90 cm  LV E/e' medial:  20.3 LV IVS:        0.70 cm      LV e' lateral:   5.44 cm/s LVOT diam:     2.10 cm      LV E/e' lateral: 23.7 LV SV:         71 LV SV Index:   35 LVOT Area:     3.46 cm  LV Volumes (MOD) LV vol d, MOD A2C: 130.0 ml LV vol d, MOD A4C: 152.0 ml LV vol s, MOD A2C: 78.2 ml LV vol s, MOD A4C: 103.0 ml LV SV MOD A2C:     51.8 ml LV SV MOD A4C:     152.0 ml LV SV MOD BP:      51.9 ml RIGHT VENTRICLE RV S prime:     13.10 cm/s TAPSE (M-mode): 1.8 cm LEFT ATRIUM              Index        RIGHT ATRIUM           Index LA diam:        5.30 cm  2.62 cm/m   RA Area:     18.30 cm LA Vol (A2C):   83.3 ml  41.19 ml/m  RA Volume:   53.00 ml  26.21 ml/m LA Vol (A4C):   107.0 ml 52.91 ml/m LA Biplane Vol: 98.1 ml  48.51 ml/m  AORTIC VALVE AV Area (Vmax):    1.29 cm AV Area (Vmean):   1.29 cm AV Area (VTI):     1.20 cm AV Vmax:           282.00 cm/s AV Vmean:          214.000 cm/s AV VTI:            0.597 m AV Peak Grad:      31.8 mmHg AV Mean Grad:      20.0 mmHg LVOT Vmax:         105.00 cm/s LVOT Vmean:        79.700 cm/s LVOT VTI:          0.206 m LVOT/AV VTI ratio: 0.35  AORTA Ao Root diam: 2.90 cm Ao Asc diam:  2.90 cm MITRAL VALVE                TRICUSPID VALVE MV Area (PHT): 4.83 cm     TR Peak grad:   31.4 mmHg MV Area VTI:   2.64 cm     TR Vmax:        280.00 cm/s MV Peak grad:  7.6 mmHg MV Mean grad:  3.0 mmHg     SHUNTS MV Vmax:       1.38 m/s     Systemic VTI:  0.21 m MV Vmean:      84.8 cm/s    Systemic Diam: 2.10 cm MV Decel Time: 157 msec MV E velocity: 129.00 cm/s MV A velocity:  92.40 cm/s MV E/A ratio:  1.40 Jenkins Rouge MD Electronically signed by Jenkins Rouge MD Signature Date/Time: 07/27/2022/2:08:07 PM    Final    CT Angio Chest PE W and/or Wo Contrast  Result Date: 07/26/2022 CLINICAL DATA:  Shortness of breath since this morning. Pulmonary embolism suspected, high probability. EXAM: CT ANGIOGRAPHY CHEST WITH CONTRAST TECHNIQUE: Multidetector CT imaging of the chest was performed using the standard protocol during bolus administration of intravenous contrast. Multiplanar CT image reconstructions and MIPs were obtained to evaluate the vascular  anatomy. RADIATION DOSE REDUCTION: This exam was performed according to the departmental dose-optimization program which includes automated exposure control, adjustment of the mA and/or kV according to patient size and/or use of iterative reconstruction technique. CONTRAST:  22mL OMNIPAQUE IOHEXOL 350 MG/ML SOLN COMPARISON:  None Available. FINDINGS: Cardiovascular: Some of the most peripheral segmental and subsegmental pulmonary artery branches can not be definitively characterized due to patient breathing motion artifact, however, there is no pulmonary embolism identified within the main, lobar or central segmental pulmonary arteries bilaterally. No thoracic aortic aneurysm. Scattered aortic atherosclerosis. Diffuse coronary artery calcifications. No pericardial effusion. Mediastinum/Nodes: No mass or enlarged lymph nodes are seen within the mediastinum or perihilar regions. Esophagus is unremarkable. Trachea and central bronchi are unremarkable. Lungs/Pleura: Bilateral pleural effusions, both moderate to large in size, RIGHT slightly greater than LEFT, with associated compressive atelectasis. No pneumothorax. Upper Abdomen: No acute findings on limited images. Musculoskeletal: Degenerative spondylosis of the thoracolumbar spine, mild to moderate in degree. No acute-appearing osseous abnormality. Review of the MIP images confirms the above  findings. IMPRESSION: 1. Bilateral pleural effusions, both moderate to large in size, RIGHT slightly greater than LEFT, with associated compressive atelectasis. 2. No pulmonary embolism identified, with mild study limitations detailed above. 3. Diffuse coronary artery calcifications, particularly dense within the LEFT main and LEFT anterior descending coronary artery. Electronically Signed   By: Franki Cabot M.D.   On: 07/26/2022 16:16   DG Chest Port 1 View  Result Date: 07/26/2022 CLINICAL DATA:  Dyspnea EXAM: PORTABLE CHEST 1 VIEW COMPARISON:  05/06/2022 chest radiograph. FINDINGS: Stable configuration of 3 lead left subclavian ICD. Stable cardiomediastinal silhouette with mild to moderate cardiomegaly. No pneumothorax. Small bilateral pleural effusions, right greater than left. Mild pulmonary edema. Hazy bibasilar lung opacities. IMPRESSION: 1. Mild congestive heart failure. 2. Small bilateral pleural effusions, right greater than left. 3. Hazy bibasilar lung opacities, favor atelectasis. Electronically Signed   By: Ilona Sorrel M.D.   On: 07/26/2022 15:46    Pending Labs Unresulted Labs (From admission, onward)     Start     Ordered   07/28/22 0500  CBC  Tomorrow morning,   R        07/27/22 1223   07/28/22 0500  Renal function panel  Tomorrow morning,   R        07/27/22 1223   07/27/22 0600  CBC  Once,   R        07/27/22 0600   07/26/22 2239  Hepatitis B surface antigen  (New Admission [facility] (Hepatitis B))  Once,   URGENT        07/26/22 2240   07/26/22 2239  Hepatitis B surface antibody,quantitative  (New Admission [facility] (Hepatitis B))  Once,   URGENT        07/26/22 2240   07/26/22 1526  Blood culture (routine x 2)  BLOOD CULTURE X 2,   R (with STAT occurrences)      07/26/22 1525            Vitals/Pain Today's Vitals   07/27/22 1500 07/27/22 1528 07/27/22 1645 07/27/22 1700  BP: (!) 93/48  (!) 98/52 (!) 113/54  Pulse: 89  86 86  Resp:   18   Temp:  98.1 F (36.7  C)    TempSrc:  Oral    SpO2: 96%  98% 100%  Weight:      Height:      PainSc:        Isolation Precautions  No active isolations  Medications Medications  Chlorhexidine Gluconate Cloth 2 % PADS 6 each (6 each Topical Not Given 07/27/22 0619)  sevelamer carbonate (RENVELA) tablet 1,600 mg (1,600 mg Oral Given 07/27/22 1245)  polyethylene glycol (MIRALAX / GLYCOLAX) packet 17 g (17 g Oral Not Given 07/27/22 0924)  aspirin EC tablet 81 mg (81 mg Oral Given 07/27/22 0934)  isosorbide mononitrate (IMDUR) 24 hr tablet 15 mg (15 mg Oral Not Given 07/27/22 0934)  cyanocobalamin (VITAMIN B12) tablet 1,000 mcg (1,000 mcg Oral Given 07/27/22 0935)  multivitamin (RENA-VIT) tablet 1 tablet (has no administration in time range)  midodrine (PROAMATINE) tablet 10 mg (10 mg Oral Not Given 07/27/22 1245)  docusate sodium (COLACE) capsule 100 mg (100 mg Oral Given 07/27/22 0934)  saccharomyces boulardii (FLORASTOR) capsule 250 mg (250 mg Oral Not Given 07/27/22 0935)  pantoprazole (PROTONIX) EC tablet 40 mg (40 mg Oral Given 07/27/22 0935)  atorvastatin (LIPITOR) tablet 80 mg (has no administration in time range)  polyvinyl alcohol (LIQUIFILM TEARS) 1.4 % ophthalmic solution 2 drop (2 drops Both Eyes Not Given 07/27/22 0935)  ticagrelor (BRILINTA) tablet 90 mg (90 mg Oral Given 07/27/22 0934)  ezetimibe (ZETIA) tablet 10 mg (10 mg Oral Given 07/27/22 0936)  gabapentin (NEURONTIN) capsule 100 mg (has no administration in time range)  melatonin tablet 3 mg (has no administration in time range)  nitroGLYCERIN (NITROSTAT) SL tablet 0.4 mg (has no administration in time range)  HYDROcodone-acetaminophen (NORCO/VICODIN) 5-325 MG per tablet 1 tablet (has no administration in time range)  heparin injection 5,000 Units (5,000 Units Subcutaneous Given 07/27/22 1557)  acetaminophen (TYLENOL) tablet 650 mg (has no administration in time range)    Or  acetaminophen (TYLENOL) suppository 650 mg (has no administration in time  range)  senna-docusate (Senokot-S) tablet 1 tablet (has no administration in time range)  promethazine (PHENERGAN) 6.25 mg in sodium chloride 0.9 % 50 mL IVPB (has no administration in time range)  Darbepoetin Alfa (ARANESP) injection 150 mcg (has no administration in time range)  hydrALAZINE (APRESOLINE) tablet 10 mg (10 mg Oral Not Given 07/27/22 1512)  perflutren lipid microspheres (DEFINITY) IV suspension (3 mLs Intravenous Given 07/27/22 1345)  hydrOXYzine (ATARAX) tablet 25-50 mg (50 mg Oral Given 07/27/22 1528)  midodrine (PROAMATINE) tablet 5 mg (5 mg Oral Given 07/26/22 1700)  iohexol (OMNIPAQUE) 350 MG/ML injection 75 mL (75 mLs Intravenous Contrast Given 07/26/22 1605)  fentaNYL (SUBLIMAZE) injection 25 mcg (25 mcg Intravenous Given 07/26/22 1700)    Mobility Stand and pivot High fall risk   Focused Assessments Pulmonary Assessment Handoff:  Lung sounds: Bilateral Breath Sounds: Diminished, Rhonchi L Breath Sounds: Diminished R Breath Sounds: Diminished O2 Device: Room Air      R Recommendations: See Admitting Provider Note  Report given to:   Additional Notes:

## 2022-07-27 NOTE — ED Notes (Signed)
Verification of medications request sent to the pharmacy at this time via RX feature.

## 2022-07-27 NOTE — Progress Notes (Signed)
Echocardiogram 2D Echocardiogram has been performed.  Oneal Deputy Alexandru Moorer RDCS 07/27/2022, 1:44 PM

## 2022-07-27 NOTE — Progress Notes (Signed)
Pt said does not wear cpap at home

## 2022-07-28 DIAGNOSIS — I2489 Other forms of acute ischemic heart disease: Secondary | ICD-10-CM | POA: Diagnosis not present

## 2022-07-28 DIAGNOSIS — I5023 Acute on chronic systolic (congestive) heart failure: Secondary | ICD-10-CM | POA: Diagnosis not present

## 2022-07-28 DIAGNOSIS — J9601 Acute respiratory failure with hypoxia: Secondary | ICD-10-CM | POA: Diagnosis not present

## 2022-07-28 LAB — CBC
HCT: 24.8 % — ABNORMAL LOW (ref 39.0–52.0)
Hemoglobin: 7.9 g/dL — ABNORMAL LOW (ref 13.0–17.0)
MCH: 32 pg (ref 26.0–34.0)
MCHC: 31.9 g/dL (ref 30.0–36.0)
MCV: 100.4 fL — ABNORMAL HIGH (ref 80.0–100.0)
Platelets: 178 10*3/uL (ref 150–400)
RBC: 2.47 MIL/uL — ABNORMAL LOW (ref 4.22–5.81)
RDW: 17.3 % — ABNORMAL HIGH (ref 11.5–15.5)
WBC: 9.3 10*3/uL (ref 4.0–10.5)
nRBC: 0 % (ref 0.0–0.2)

## 2022-07-28 LAB — RENAL FUNCTION PANEL
Albumin: 2.8 g/dL — ABNORMAL LOW (ref 3.5–5.0)
Anion gap: 11 (ref 5–15)
BUN: 27 mg/dL — ABNORMAL HIGH (ref 8–23)
CO2: 30 mmol/L (ref 22–32)
Calcium: 9.1 mg/dL (ref 8.9–10.3)
Chloride: 94 mmol/L — ABNORMAL LOW (ref 98–111)
Creatinine, Ser: 5.12 mg/dL — ABNORMAL HIGH (ref 0.61–1.24)
GFR, Estimated: 10 mL/min — ABNORMAL LOW (ref >60)
Glucose, Bld: 138 mg/dL — ABNORMAL HIGH (ref 70–99)
Phosphorus: 3.8 mg/dL (ref 2.5–4.6)
Potassium: 3.5 mmol/L (ref 3.5–5.1)
Sodium: 135 mmol/L (ref 135–145)

## 2022-07-28 LAB — HEPATITIS B SURFACE ANTIBODY, QUANTITATIVE: Hep B S AB Quant (Post): 18 m[IU]/mL (ref >9.9)

## 2022-07-28 MED ORDER — CARVEDILOL 3.125 MG PO TABS
3.1250 mg | ORAL_TABLET | Freq: Two times a day (BID) | ORAL | Status: DC
  Administered 2022-07-28: 3.125 mg via ORAL
  Filled 2022-07-28: qty 1

## 2022-07-28 MED ORDER — HEPARIN SODIUM (PORCINE) 1000 UNIT/ML IJ SOLN
INTRAMUSCULAR | Status: AC
  Administered 2022-07-28: 2500 [IU]
  Filled 2022-07-28: qty 3

## 2022-07-28 MED ORDER — MIDODRINE HCL 5 MG PO TABS
10.0000 mg | ORAL_TABLET | Freq: Once | ORAL | Status: AC
  Administered 2022-07-28: 10 mg via ORAL

## 2022-07-28 MED ORDER — ORAL CARE MOUTH RINSE: 15.0000 mL | OROMUCOSAL | Status: DC | PRN

## 2022-07-28 NOTE — Progress Notes (Signed)
Back from the hemodialysis by bed awake and alert.

## 2022-07-28 NOTE — Progress Notes (Signed)
Received patient in bed to unit.  Alert and oriented.  Informed consent signed and in chart.   Treatment initiated: [place] Treatment completed: 1132  Patient tolerated well.  Transported back to the room  Alert, without acute distress.  Hand-off given to patient's nurse.   Access used: AVF Access issues: none  Total UF removed: 3.5L Medication(s) given: Phenargan, Midodrine Post HD VS: 103/66,98.2,80,22,100%  Post HD weight: 88.1kg   [facility]

## 2022-07-28 NOTE — Progress Notes (Signed)
Cardiology Progress Note  Patient ID: Rodney Farley MRN: [id number] DOB: Jul 20, 1931 Date of Encounter: 07/28/2022  Primary Cardiologist: Axavier [name], MD  Subjective   Chief Complaint: SOB  HPI: Shortness of breath.  On hemodialysis.  We discussed that medical management is recommended for his cardiomyopathy.  He is in agreement.  ROS:  All other ROS reviewed and negative. Pertinent positives noted in the HPI.     Inpatient Medications  Scheduled Meds:  aspirin EC  81 mg Oral Daily   atorvastatin  80 mg Oral QHS   Chlorhexidine Gluconate Cloth  6 each Topical Q0600   cyanocobalamin  1,000 mcg Oral Daily   darbepoetin (ARANESP) injection - DIALYSIS  150 mcg Subcutaneous Q Mon-1800   docusate sodium  100 mg Oral BID   ezetimibe  10 mg Oral Daily   gabapentin  100 mg Oral QHS   heparin  5,000 Units Subcutaneous Q8H   hydrALAZINE  10 mg Oral Q8H   isosorbide mononitrate  15 mg Oral Daily   melatonin  3 mg Oral QHS   midodrine  10 mg Oral Q M,W,F-HD   midodrine  10 mg Oral Once in dialysis   multivitamin  1 tablet Oral QHS   pantoprazole  40 mg Oral Daily   polyethylene glycol  17 g Oral Daily   polyvinyl alcohol  2 drop Both Eyes TID   saccharomyces boulardii  250 mg Oral BID   sevelamer carbonate  1,600 mg Oral TID WC   ticagrelor  90 mg Oral BID   Continuous Infusions:  promethazine (PHENERGAN) injection (IM or IVPB) Stopped (07/28/22 0807)   PRN Meds: acetaminophen **OR** acetaminophen, HYDROcodone-acetaminophen, hydrOXYzine, nitroGLYCERIN, promethazine (PHENERGAN) injection (IM or IVPB), senna-docusate   Vital Signs   Vitals:   07/27/22 2312 07/28/22 0404 07/28/22 0740 07/28/22 0756  BP: (!) 96/42 (!) 106/54 (!) 92/54 (!) 98/50  Pulse:  84 89 82  Resp:  15 (!) 25 (!) 33  Temp: 97.8 F (36.6 C) 98.3 F (36.8 C) 98.1 F (36.7 C)   TempSrc: Oral Oral Oral   SpO2:  97% 93% 96%  Weight:   91 kg   Height:        Intake/Output Summary (Last 24 hours) at  07/28/2022 0907 Last data filed at 07/28/2022 0406 Gross per 24 hour  Intake --  Output 0 ml  Net 0 ml      07/28/2022    7:40 AM 07/27/2022    6:24 PM 07/26/2022    2:42 PM  Last 3 Weights  Weight (lbs) 200 lb 9.9 oz 186 lb 8.2 oz 200 lb  Weight (kg) 91 kg 84.6 kg 90.719 kg      Telemetry  Overnight telemetry shows V paced rhythm, which I personally reviewed.   ECG  The most recent ECG shows V paced rhythm heart rate 83, which I personally reviewed.   Physical Exam   Vitals:   07/27/22 2312 07/28/22 0404 07/28/22 0740 07/28/22 0756  BP: (!) 96/42 (!) 106/54 (!) 92/54 (!) 98/50  Pulse:  84 89 82  Resp:  15 (!) 25 (!) 33  Temp: 97.8 F (36.6 C) 98.3 F (36.8 C) 98.1 F (36.7 C)   TempSrc: Oral Oral Oral   SpO2:  97% 93% 96%  Weight:   91 kg   Height:        Intake/Output Summary (Last 24 hours) at 07/28/2022 2947 Last data filed at 07/28/2022 0406 Gross per 24 hour  Intake --  Output 0 ml  Net 0 ml       07/28/2022    7:40 AM 07/27/2022    6:24 PM 07/26/2022    2:42 PM  Last 3 Weights  Weight (lbs) 200 lb 9.9 oz 186 lb 8.2 oz 200 lb  Weight (kg) 91 kg 84.6 kg 90.719 kg    Body mass index is 31.42 kg/m.   General: Well nourished, well developed, in no acute distress Head: Atraumatic, normal size  Eyes: PEERLA, EOMI  Neck: Supple, no JVD Endocrine: No thryomegaly Cardiac: Normal S1, S2; RRR; 2/6 SEM Lungs: diminished breath sounds  Abd: Soft, nontender, no hepatomegaly  Ext: 2+ LE edema  Musculoskeletal: No deformities, BUE and BLE strength normal and equal Skin: Warm and dry, no rashes   Neuro: Alert and oriented to person, place, time, and situation, CNII-XII grossly intact, no focal deficits  Psych: Normal mood and affect   Labs  High Sensitivity Troponin:   Recent Labs  Lab 07/26/22 1505 07/26/22 1705  TROPONINIHS 1,379* 1,334*     Cardiac EnzymesNo results for input(s): "TROPONINI" in the last 168 hours. No results for input(s): "TROPIPOC" in the  last 168 hours.  Chemistry Recent Labs  Lab 07/26/22 1505 07/26/22 1618 07/26/22 2211 07/27/22 0005 07/28/22 0019  NA 135   < > 137 136 135  K 4.1   < > 4.3 4.5 3.5  CL 93*  --   --  94* 94*  CO2 30  --   --  28 30  GLUCOSE 163*  --   --  116* 138*  BUN 31*  --   --  34* 27*  CREATININE 5.96*  --   --  6.54*  6.51* 5.12*  CALCIUM 8.9  --   --  8.7* 9.1  ALBUMIN  --   --   --  3.0* 2.8*  GFRNONAA 8*  --   --  8*  8* 10*  ANIONGAP 12  --   --  14 11   < > = values in this interval not displayed.    Hematology Recent Labs  Lab 07/26/22 1505 07/26/22 1618 07/26/22 2211 07/27/22 0005 07/28/22 0019  WBC 14.2*  --   --  11.8* 9.3  RBC 2.33*  --   --  2.37* 2.47*  HGB 7.7*   < > 8.5* 7.7* 7.9*  HCT 24.1*   < > 25.0* 24.0* 24.8*  MCV 103.4*  --   --  101.3* 100.4*  MCH 33.0  --   --  32.5 32.0  MCHC 32.0  --   --  32.1 31.9  RDW 17.2*  --   --  17.3* 17.3*  PLT 179  --   --  178 178   < > = values in this interval not displayed.   BNP Recent Labs  Lab 07/26/22 1505  BNP 1,410.5*    DDimer  Recent Labs  Lab 07/26/22 1526  DDIMER 1.20*     Radiology  ECHOCARDIOGRAM COMPLETE  Result Date: 07/27/2022    ECHOCARDIOGRAM REPORT   Patient Name:   Rodney Farley Date of Exam: 07/27/2022 Medical Rec #:  [id number]             Height:       67.0 in Accession #:    [id number]            Weight:       200.0 lb Date of Birth:  02-06-1932  BSA:          2.022 m Patient Age:    89 years              BP:           95/46 mmHg Patient Gender: M                     HR:           90 bpm. Exam Location:  Inpatient Procedure: 2D Echo, Color Doppler, Cardiac Doppler and Intracardiac            Opacification Agent Indications:    C94.70 Acute systolic (congestive) heart failure  History:        Patient has prior history of Echocardiogram examinations, most                 recent 05/07/2022. CHF, Pacemaker and Defibrillator, COPD and                 ESRD, Arrythmias:Atrial  Fibrillation and AV Block; Risk                 Factors:Hypertension, Diabetes, Dyslipidemia and Sleep Apnea.  Sonographer:    Raquel Sarna Senior RDCS Referring Phys: [id number] [place]  1. Septal apical akinesis distal anterior wall and inferior apical hypokinesis . Left ventricular ejection fraction, by estimation, is 40 to 45%. The left ventricle has mildly decreased function. The left ventricle has no regional wall motion abnormalities. The left ventricular internal cavity size was moderately dilated. Left ventricular diastolic parameters are consistent with Grade II diastolic dysfunction (pseudonormalization). Elevated left ventricular end-diastolic pressure.  2. Cathers in RA/RV . Right ventricular systolic function is normal. The right ventricular size is normal. There is moderately elevated pulmonary artery systolic pressure.  3. Left atrial size was severely dilated.  4. The mitral valve is abnormal. Mild mitral valve regurgitation. No evidence of mitral stenosis. Severe mitral annular calcification.  5. The aortic valve is tricuspid. There is moderate calcification of the aortic valve. There is moderate thickening of the aortic valve. Aortic valve regurgitation is not visualized. Moderate aortic valve stenosis.  6. The inferior vena cava is dilated in size with <50% respiratory variability, suggesting right atrial pressure of 15 mmHg. FINDINGS  Left Ventricle: Septal apical akinesis distal anterior wall and inferior apical hypokinesis. Left ventricular ejection fraction, by estimation, is 40 to 45%. The left ventricle has mildly decreased function. The left ventricle has no regional wall motion abnormalities. Definity contrast agent was given IV to delineate the left ventricular endocardial borders. The left ventricular internal cavity size was moderately dilated. There is no left ventricular hypertrophy. Left ventricular diastolic parameters are consistent with Grade II diastolic  dysfunction (pseudonormalization). Elevated left ventricular end-diastolic pressure. Right Ventricle: Cathers in RA/RV. The right ventricular size is normal. No increase in right ventricular wall thickness. Right ventricular systolic function is normal. There is moderately elevated pulmonary artery systolic pressure. The tricuspid regurgitant velocity is 2.80 m/s, and with an assumed right atrial pressure of 15 mmHg, the estimated right ventricular systolic pressure is 29.4 mmHg. Left Atrium: Left atrial size was severely dilated. Right Atrium: Right atrial size was normal in size. Pericardium: There is no evidence of pericardial effusion. Mitral Valve: The mitral valve is abnormal. There is moderate thickening of the mitral valve leaflet(s). There is moderate calcification of the mitral valve leaflet(s). Severe mitral annular calcification. Mild mitral valve regurgitation. No evidence of mitral valve stenosis.  MV peak gradient, 7.6 mmHg. The mean mitral valve gradient is 3.0 mmHg. Tricuspid Valve: The tricuspid valve is normal in structure. Tricuspid valve regurgitation is mild . No evidence of tricuspid stenosis. Aortic Valve: The aortic valve is tricuspid. There is moderate calcification of the aortic valve. There is moderate thickening of the aortic valve. Aortic valve regurgitation is not visualized. Moderate aortic stenosis is present. Aortic valve mean gradient measures 20.0 mmHg. Aortic valve peak gradient measures 31.8 mmHg. Aortic valve area, by VTI measures 1.20 cm. Pulmonic Valve: The pulmonic valve was normal in structure. Pulmonic valve regurgitation is mild. No evidence of pulmonic stenosis. Aorta: The aortic root is normal in size and structure. Venous: The inferior vena cava is dilated in size with less than 50% respiratory variability, suggesting right atrial pressure of 15 mmHg. IAS/Shunts: No atrial level shunt detected by color flow Doppler.  LEFT VENTRICLE PLAX 2D LVIDd:         5.70 cm       Diastology LVIDs:         4.40 cm      LV e' medial:    6.37 cm/s LV PW:         0.90 cm      LV E/e' medial:  20.3 LV IVS:        0.70 cm      LV e' lateral:   5.44 cm/s LVOT diam:     2.10 cm      LV E/e' lateral: 23.7 LV SV:         71 LV SV Index:   35 LVOT Area:     3.46 cm  LV Volumes (MOD) LV vol d, MOD A2C: 130.0 ml LV vol d, MOD A4C: 152.0 ml LV vol s, MOD A2C: 78.2 ml LV vol s, MOD A4C: 103.0 ml LV SV MOD A2C:     51.8 ml LV SV MOD A4C:     152.0 ml LV SV MOD BP:      51.9 ml RIGHT VENTRICLE RV S prime:     13.10 cm/s TAPSE (M-mode): 1.8 cm LEFT ATRIUM              Index        RIGHT ATRIUM           Index LA diam:        5.30 cm  2.62 cm/m   RA Area:     18.30 cm LA Vol (A2C):   83.3 ml  41.19 ml/m  RA Volume:   53.00 ml  26.21 ml/m LA Vol (A4C):   107.0 ml 52.91 ml/m LA Biplane Vol: 98.1 ml  48.51 ml/m  AORTIC VALVE AV Area (Vmax):    1.29 cm AV Area (Vmean):   1.29 cm AV Area (VTI):     1.20 cm AV Vmax:           282.00 cm/s AV Vmean:          214.000 cm/s AV VTI:            0.597 m AV Peak Grad:      31.8 mmHg AV Mean Grad:      20.0 mmHg LVOT Vmax:         105.00 cm/s LVOT Vmean:        79.700 cm/s LVOT VTI:          0.206 m LVOT/AV VTI ratio: 0.35  AORTA Ao Root diam: 2.90 cm Ao Asc diam:  2.90  cm MITRAL VALVE                TRICUSPID VALVE MV Area (PHT): 4.83 cm     TR Peak grad:   31.4 mmHg MV Area VTI:   2.64 cm     TR Vmax:        280.00 cm/s MV Peak grad:  7.6 mmHg MV Mean grad:  3.0 mmHg     SHUNTS MV Vmax:       1.38 m/s     Systemic VTI:  0.21 m MV Vmean:      84.8 cm/s    Systemic Diam: 2.10 cm MV Decel Time: 157 msec MV E velocity: 129.00 cm/s MV A velocity: 92.40 cm/s MV E/A ratio:  1.40 Jenkins Rouge MD Electronically signed by Jenkins Rouge MD Signature Date/Time: 07/27/2022/2:08:07 PM    Final    CT Angio Chest PE W and/or Wo Contrast  Result Date: 07/26/2022 CLINICAL DATA:  Shortness of breath since this morning. Pulmonary embolism suspected, high probability. EXAM: CT  ANGIOGRAPHY CHEST WITH CONTRAST TECHNIQUE: Multidetector CT imaging of the chest was performed using the standard protocol during bolus administration of intravenous contrast. Multiplanar CT image reconstructions and MIPs were obtained to evaluate the vascular anatomy. RADIATION DOSE REDUCTION: This exam was performed according to the departmental dose-optimization program which includes automated exposure control, adjustment of the mA and/or kV according to patient size and/or use of iterative reconstruction technique. CONTRAST:  62mL OMNIPAQUE IOHEXOL 350 MG/ML SOLN COMPARISON:  None Available. FINDINGS: Cardiovascular: Some of the most peripheral segmental and subsegmental pulmonary artery branches can not be definitively characterized due to patient breathing motion artifact, however, there is no pulmonary embolism identified within the main, lobar or central segmental pulmonary arteries bilaterally. No thoracic aortic aneurysm. Scattered aortic atherosclerosis. Diffuse coronary artery calcifications. No pericardial effusion. Mediastinum/Nodes: No mass or enlarged lymph nodes are seen within the mediastinum or perihilar regions. Esophagus is unremarkable. Trachea and central bronchi are unremarkable. Lungs/Pleura: Bilateral pleural effusions, both moderate to large in size, RIGHT slightly greater than LEFT, with associated compressive atelectasis. No pneumothorax. Upper Abdomen: No acute findings on limited images. Musculoskeletal: Degenerative spondylosis of the thoracolumbar spine, mild to moderate in degree. No acute-appearing osseous abnormality. Review of the MIP images confirms the above findings. IMPRESSION: 1. Bilateral pleural effusions, both moderate to large in size, RIGHT slightly greater than LEFT, with associated compressive atelectasis. 2. No pulmonary embolism identified, with mild study limitations detailed above. 3. Diffuse coronary artery calcifications, particularly dense within the LEFT main  and LEFT anterior descending coronary artery. Electronically Signed   By: Franki Cabot M.D.   On: 07/26/2022 16:16   DG Chest Port 1 View  Result Date: 07/26/2022 CLINICAL DATA:  Dyspnea EXAM: PORTABLE CHEST 1 VIEW COMPARISON:  05/06/2022 chest radiograph. FINDINGS: Stable configuration of 3 lead left subclavian ICD. Stable cardiomediastinal silhouette with mild to moderate cardiomegaly. No pneumothorax. Small bilateral pleural effusions, right greater than left. Mild pulmonary edema. Hazy bibasilar lung opacities. IMPRESSION: 1. Mild congestive heart failure. 2. Small bilateral pleural effusions, right greater than left. 3. Hazy bibasilar lung opacities, favor atelectasis. Electronically Signed   By: Ilona Sorrel M.D.   On: 07/26/2022 15:46    Cardiac Studies  TTE 05/28/2023  1. Septal apical akinesis distal anterior wall and inferior apical  hypokinesis . Left ventricular ejection fraction, by estimation, is 40 to  45%. The left ventricle has mildly decreased function. The left ventricle  has no regional wall  motion  abnormalities. The left ventricular internal cavity size was moderately  dilated. Left ventricular diastolic parameters are consistent with Grade  II diastolic dysfunction (pseudonormalization). Elevated left ventricular  end-diastolic pressure.   2. Cathers in RA/RV . Right ventricular systolic function is normal. The  right ventricular size is normal. There is moderately elevated pulmonary  artery systolic pressure.   3. Left atrial size was severely dilated.   4. The mitral valve is abnormal. Mild mitral valve regurgitation. No  evidence of mitral stenosis. Severe mitral annular calcification.   5. The aortic valve is tricuspid. There is moderate calcification of the  aortic valve. There is moderate thickening of the aortic valve. Aortic  valve regurgitation is not visualized. Moderate aortic valve stenosis.   6. The inferior vena cava is dilated in size with <50%  respiratory  variability, suggesting right atrial pressure of 15 mmHg.   LHC 05/07/2022    Ost RCA to Prox RCA lesion is 70% stenosed.  Small vessel.   Prox Cx lesion is 30% stenosed.   Mid Cx to Dist Cx lesion is 40% stenosed.   Mid Cx lesion is 30% stenosed.   2nd Diag-1 lesion is 30% stenosed.   3rd Mrg lesion is 50% stenosed.   Non-stenotic 2nd Diag-2 lesion was previously treated.  Patent stent.   Dist LM to Ost LAD lesion is 90% stenosed.   Scoring balloon angioplasty was performed using a BALLN SCOREFLEX 3.50X10.   Post intervention, there is a 20% residual stenosis.  Patient Profile  Alecxander Mainwaring is a 89 y.o. male with systolic heart failure (EF 45 to 50%), CAD status post PCI, ESRD on hemodialysis, COPD, diabetes who was admitted on 03/26/7828 for acute systolic heart failure and volume overload.  Cardiology consulted for elevated troponin value.   Assessment & Plan   # Acute hypoxic respiratory failure # Acute systolic heart failure, EF 40-45%, with wall motion abnormality # ESRD on hemodialysis -Ejection fraction close to similar.  I reviewed the echo.  He does have a similar wall motion abnormality in the LAD distribution as well.  He had a recent difficult intervention to the ostial LAD.  Medical management has been recommended moving forward. -Still volume overloaded.  Plan for hemodialysis today.  He is an uric.  We will have to get him euvolemic with dialysis. -Long discussion with patient and daughter yesterday.  Plan is for medical management.  He is elderly and frail in no hemodialysis.  Given recent cardiac cath with limited options for repeat revascularization I do not see a need to put him through another procedure.  The patient is in agreement.  We will continue with medical management. -No chest pain. -Add carvedilol 3.125 mg twice daily.  Continue Imdur 15 mg daily.  Hydralazine 10 mg 3 times daily was added yesterday.  Not a candidate for ACE/ARB/ARNI/MRA  given ESRD.  # Elevated troponin, demand ischemia # CAD status post PCI -Recent left heart cath in October with severe in-stent restenosis of an ostial LAD stent.  Underwent balloon angioplasty.  Limited options for repeat intervention moving forward per interventional cardiology. -Suspect the troponin elevation is secondary to decompensated heart failure.  We have decided to manage this medically. -The patient is on dialysis.  He is frail.  He has a cardiomyopathy.  He also suffers from chronic anemia.  We discussed that medical management is the best option.  The patient is in agreement. -Add carvedilol 3.125 mg twice daily. -Continue aspirin 81 mg  daily.  On Brilinta 90 mg twice daily. -He is on a statin.  He is on Zetia.  He is on Repatha as an outpatient.  # ESRD -Per nephrology  # Anemia -Chronic    For questions or updates, please contact [place] Please consult [email or web address] for contact info under   Signed, Lake Bells T. Audie Box, MD, [place]  07/28/2022 9:07 AM

## 2022-07-28 NOTE — Progress Notes (Signed)
OT Cancellation Note  Patient Details Name: Rodney Farley MRN: [id number] DOB: January 06, 1932   Cancelled Treatment:    Reason Eval/Treat Not Completed: Patient at procedure or test/ unavailable (pt off unit at HD, will follow up for OT evaluation as schedule permits.)  Renaye Rakers, OTD, OTR/L [facility] [phone] - [place] 07/28/2022, 7:31 AM

## 2022-07-28 NOTE — Evaluation (Signed)
Occupational Therapy Evaluation Patient Details Name: Rodney Farley MRN: [id number] DOB: Jul 31, 1931 Today's Date: 07/28/2022   History of Present Illness Pt is a 89 y.o. M who presents from [facility] 07/26/2022 with chest pain and SOB. Pt found to have hypoxic. CTA revealed pleural effusions R > L. Significant PMH: anxiety/depression, chronic systolic heart failure, COPD, diabetes mellitus, peripheral neuropathy, AICD, recent left trimalleolar ankle fx s/p intramedullary implant.   Clinical Impression   Pt from [facility], reports he has been w/c level primarily at SNF and transfers to toilet, also requires assistance for LB ADLs. Pt currently needing set up -mod A for ADLs, supervision for bed mobility, and min guard A for transfers with RW and CAM boot donned. VSS on RA throughout session. Pt presenting with impairments listed below, will follow acutely. Recommend SNF at d/c.     Recommendations for follow up therapy are one component of a multi-disciplinary discharge planning process, led by the attending physician.  Recommendations may be updated based on patient status, additional functional criteria and insurance authorization.   Follow Up Recommendations  Skilled nursing-short term rehab (<3 hours/day) ([facility])     Assistance Recommended at Discharge Frequent or constant Supervision/Assistance  Patient can return home with the following A little help with walking and/or transfers;A lot of help with bathing/dressing/bathroom;Assistance with cooking/housework;Direct supervision/assist for medications management;Direct supervision/assist for financial management;Assist for transportation;Help with stairs or ramp for entrance    Functional Status Assessment  Patient has had a recent decline in their functional status and demonstrates the ability to make significant improvements in function in a reasonable and predictable amount of time.  Equipment Recommendations  Other  (comment) (defer)    Recommendations for Other Services PT consult     Precautions / Restrictions Precautions Precautions: Fall Required Braces or Orthoses: Other Brace Other Brace: L CAM boot Restrictions Weight Bearing Restrictions: No Other Position/Activity Restrictions: Per pt, cleared for WBAT in CAM boot 1/13      Mobility Bed Mobility Overal bed mobility: Needs Assistance Bed Mobility: Supine to Sit, Sit to Supine     Supine to sit: Supervision Sit to supine: Supervision   General bed mobility comments: No physical assist. Tachypnea upon initially sitting up    Transfers Overall transfer level: Needs assistance Equipment used: Rolling walker (2 wheels) Transfers: Sit to/from Stand Sit to Stand: Min guard           General transfer comment: Min guard for safety to rise from edge of bed      Balance Overall balance assessment: Needs assistance Sitting-balance support: Feet supported Sitting balance-Leahy Scale: Fair     Standing balance support: Bilateral upper extremity supported Standing balance-Leahy Scale: Poor Standing balance comment: reliant on RW                           ADL either performed or assessed with clinical judgement   ADL Overall ADL's : Needs assistance/impaired Eating/Feeding: Set up;Sitting   Grooming: Set up;Sitting   Upper Body Bathing: Minimal assistance   Lower Body Bathing: Moderate assistance   Upper Body Dressing : Minimal assistance   Lower Body Dressing: Moderate assistance   Toilet Transfer: Minimal assistance;Ambulation;Regular Toilet;Rolling walker (2 wheels)   Toileting- Clothing Manipulation and Hygiene: Minimal assistance       Functional mobility during ADLs: Minimal assistance;Rolling walker (2 wheels)       Vision   Additional Comments: closes/squints L eye frequently, will further assess  Perception Perception Perception Tested?: No   Praxis Praxis Praxis tested?: Not  tested    Pertinent Vitals/Pain Pain Assessment Pain Assessment: No/denies pain     Hand Dominance Right   Extremity/Trunk Assessment Upper Extremity Assessment Upper Extremity Assessment: Overall WFL for tasks assessed   Lower Extremity Assessment Lower Extremity Assessment: Defer to PT evaluation RLE Deficits / Details: Strength 5/5 LLE Deficits / Details: Ankle fx 05/2023. Hip/knee strength 5/5   Cervical / Trunk Assessment Cervical / Trunk Assessment: Kyphotic   Communication Communication Communication: HOH   Cognition Arousal/Alertness: Awake/alert Behavior During Therapy: WFL for tasks assessed/performed Overall Cognitive Status: Within Functional Limits for tasks assessed                                       General Comments  VSS on RA    Exercises     Shoulder Instructions      Home Living Family/patient expects to be discharged to:: Skilled nursing facility                                 Additional Comments: [facility]      Prior Functioning/Environment Prior Level of Function : Needs assist             Mobility Comments: modI w/c, toilet transfers at SNF ADLs Comments: requires assist for LB dressing at SNF        OT Problem List: Decreased strength;Decreased range of motion;Impaired balance (sitting and/or standing);Decreased activity tolerance      OT Treatment/Interventions: Self-care/ADL training;Therapeutic exercise;Energy conservation;DME and/or AE instruction;Therapeutic activities;Patient/family education;Balance training    OT Goals(Current goals can be found in the care plan section) Acute Rehab OT Goals Patient Stated Goal: to  get stronger OT Goal Formulation: With patient Time For Goal Achievement: 08/11/22 Potential to Achieve Goals: Good ADL Goals Pt Will Perform Lower Body Dressing: with min guard assist;sitting/lateral leans;sit to/from stand Pt Will Transfer to Toilet: with min guard  assist;ambulating;regular height toilet Pt Will Perform Tub/Shower Transfer: Tub transfer;Shower transfer;with min guard assist;ambulating;rolling walker  OT Frequency: Min 2X/week    Co-evaluation PT/OT/SLP Co-Evaluation/Treatment: Yes Reason for Co-Treatment: For patient/therapist safety;To address functional/ADL transfers PT goals addressed during session: Mobility/safety with mobility OT goals addressed during session: ADL's and self-care;Strengthening/ROM      AM-PAC OT "6 Clicks" Daily Activity     Outcome Measure Help from another person eating meals?: A Little Help from another person taking care of personal grooming?: A Little Help from another person toileting, which includes using toliet, bedpan, or urinal?: A Little Help from another person bathing (including washing, rinsing, drying)?: A Lot Help from another person to put on and taking off regular upper body clothing?: A Little Help from another person to put on and taking off regular lower body clothing?: A Lot 6 Click Score: 16   End of Session Equipment Utilized During Treatment: Rolling walker (2 wheels);Other (comment) (L CAM Boot) Nurse Communication: Mobility status  Activity Tolerance: Patient tolerated treatment well Patient left: in bed;with call bell/phone within reach;with bed alarm set  OT Visit Diagnosis: Unsteadiness on feet (R26.81);Other abnormalities of gait and mobility (R26.89);Muscle weakness (generalized) (M62.81)                Time: 2694-8546 OT Time Calculation (min): 19 min Charges:  OT General Charges $OT Visit: 1 Visit  OT Evaluation $OT Eval Moderate Complexity: 1 Mod  Minard Millirons K, OTD, OTR/L SecureChat Preferred Acute Rehab [phone]   Ulla Gallo 07/28/2022, 2:40 PM

## 2022-07-28 NOTE — Procedures (Signed)
Patient seen and examined on Hemodialysis. BP (!) 98/50 (BP Location: Left Arm)   Pulse 82   Temp 98.1 F (36.7 C) (Oral)   Resp (!) 33   Ht 5\' 7"  (1.702 m)   Wt 91 kg   SpO2 96%   BMI 31.42 kg/m   QB 400 mL/ min via AVF, UF goal 3L  Tolerating treatment without complaints at this time.  BP soft- intradialytic midodrine ordered.   Madelon Lips MD [facility] [phone] 9:09 AM

## 2022-07-28 NOTE — Progress Notes (Signed)
[facility] PROGRESS NOTE  Rodney Farley (DOB: 12-Sep-1931) QZE:[id number] PCP: [name], MD  Brief Narrative: Rome Echavarria is an 89 year old male with past medical history of anxiety and depression, chronic systolic heart failure, COPD, diabetes mellitus, peripheral neuropathy, hyperlipidemia, hypertension, sleep apnea, AICD who presented from [facility] on 1/14 with chest pain and shortness of breath.  He was found to be hypoxic. CTA showed no PE or pneumonia, but did reveal pleural effusions R > L and appeared volume overloaded. HD was performed and is planned to be repeated In the ER CT positive for bilateral pleural effusions, both moderate to large in size, right slightly greater than left.  EDP contacted cardiologist Dr. Benny Lennert and nephrologist Dr. Sheran Fava.  Hemodialysis planned.  Subjective: Breathing back to normal he states, though this is with continued supplemental oxygen. No chest pain or other issues to complain about he says.   Objective: BP (!) 166/77   Pulse 80   Temp 97.8 F (36.6 C) (Oral)   Resp (!) 27   Ht 5\' 7"  (1.702 m)   Wt 88.9 kg   SpO2 99%   BMI 30.70 kg/m   Gen: Elderly, very pleasant male in no distress Pulm: Anterolateral exam while on HD with crackles at far bases, no wheezes, good air movement  CV: RRR, V paced, 1+ pitting dependent edema in legs. GI: Soft, NT, ND, +BS  Neuro: Alert and oriented. No new focal deficits. Ext: Warm, no deformities Skin: No new rashes, lesions or ulcers on visualized skin   Assessment & Plan: Acute hypoxic respiratory failure due to pulmonary edema and pleural effusions, acute on chronic HFrEF: No infectious symptoms yet, though pt reported sick contacts. WBC was mildly elevated but normalized without antimicrobial Tx. RSV, flu, covid panel negative. - Continue volume management with HD per nephrology. Performed 1/15, extra 1/16, next 1/17.  - Repeat echo is relatively unchanged with stable EF  40-45%, LAD distribution hypokinesis. Cardiology discussed with patient, family, and plans on optimizing medical management for now.  - Adding back low dose coreg (hypotension limiting GDMT). Continue imdur 15mg , hydralazine 10mg  TID. - Symptomatically improved, still volume up when examined after ~2.5L UF with dialysis today. Still on oxygen, will need to wean/check ambulatory pulse oximetry.  CAD, ICM, s/p AICD, HLD: Troponin elevation currently suspected to be due to demand ischemia in setting of acute CHF and ESRD. History of ISR of ostial LAD stent s/p balloon angioplasty Oct 2023 with limited options. Echo with stable WMA. - Appreciate cardiology assessment and recommendations. Medical management. - Continue BB, long acting nitrate, ASA, brilinta, not starting heparin at this time.  - Continue statin, zetia, outpatient repatha.   ESRD, anemia of ESRD, secondary hyperparathyroidism:  - HD per nephrology, typically MWF schedule. - ESA 1/16, continue renvela per nephrology  History of left ankle fracture: Nov 2023.  - Continue orthopedics follow up  Chronic hypotension:  - Continue intradialytic midodrine (MWF)  GERD:  - PPI  OSA:  - CPAP  Patrecia Pour, MD [facility] [email or web address] 07/28/2022, 12:06 PM

## 2022-07-28 NOTE — Progress Notes (Signed)
PT Cancellation Note  Patient Details Name: Rodney Farley MRN: [id number] DOB: 01-11-1932   Cancelled Treatment:    Reason Eval/Treat Not Completed: Patient at procedure or test/unavailable (HD)  Wyona Almas, PT, DPT Acute Rehabilitation Services Office [place] 07/28/2022, 7:32 AM

## 2022-07-28 NOTE — Progress Notes (Signed)
Transported to HD by bed awake and alert. °

## 2022-07-28 NOTE — Progress Notes (Signed)
Rodney Farley Progress Note   Assessment/ Plan:   P HD: [facility] MWF  4h  400/1.5  94kg  2/2 bath  AVF  Hep 2500+ 1018midrun - venofer 100mg  tiw thru 1/24 - mircera 150 mcg q2, last 12/31, due 1/14 (today) - last HD 1/12, post wt 95.9kg   - dry wt recently raised 93 > 94kg   Assessment/ Plan: SOB/ resp distress - w/ bronchspasm per ED notes, also mild CHF by xray. +LE edema. HD with 3L off 07/27/22, Extra HD today ESRD - on HD MWF. Has not missed HD. HD 1/15, 1/16 HTN/ volume - BP's wnl, vol excess as above.  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  MBD ckd - Ca in range, add on phos/ albumin.  CAD / sp AICD Elevated troponin: cardiology following, TTE with EF 45%, G2DD Dispo: pending  Subjective:    Quiet this AM.  Extra HD today.  No complaints.     Objective:   BP (!) 98/50 (BP Location: Left Arm)   Pulse 82   Temp 98.1 F (36.7 C) (Oral)   Resp (!) 33   Ht 5\' 7"  (1.702 m)   Wt 91 kg   SpO2 96%   BMI 31.42 kg/m   Physical Exam: Gen:NAD, sleeping, arousable CVS: RRR II VI SEM Resp: coarse basilar breath sounds, some expiratory wheezes XTA:VWPV Ext: 2+ bilateral LE edema ACCESS: L AVF  Labs: BMET Recent Labs  Lab 07/26/22 1505 07/26/22 1618 07/26/22 2211 07/27/22 0005 07/28/22 0019  NA 135 137 137 136 135  K 4.1 4.1 4.3 4.5 3.5  CL 93*  --   --  94* 94*  CO2 30  --   --  28 30  GLUCOSE 163*  --   --  116* 138*  BUN 31*  --   --  34* 27*  CREATININE 5.96*  --   --  6.54*  6.51* 5.12*  CALCIUM 8.9  --   --  8.7* 9.1  PHOS  --   --   --  4.8* 3.8   CBC Recent Labs  Lab 07/26/22 1505 07/26/22 1618 07/26/22 2211 07/27/22 0005 07/28/22 0019  WBC 14.2*  --   --  11.8* 9.3  NEUTROABS 11.0*  --   --   --   --   HGB 7.7* 8.2* 8.5* 7.7* 7.9*  HCT 24.1* 24.0* 25.0* 24.0* 24.8*  MCV 103.4*  --   --  101.3* 100.4*  PLT 179  --   --  178 178      Medications:     aspirin EC  81 mg Oral Daily   atorvastatin  80  mg Oral QHS   Chlorhexidine Gluconate Cloth  6 each Topical Q0600   cyanocobalamin  1,000 mcg Oral Daily   darbepoetin (ARANESP) injection - DIALYSIS  150 mcg Subcutaneous Q Mon-1800   docusate sodium  100 mg Oral BID   ezetimibe  10 mg Oral Daily   gabapentin  100 mg Oral QHS   heparin  5,000 Units Subcutaneous Q8H   hydrALAZINE  10 mg Oral Q8H   isosorbide mononitrate  15 mg Oral Daily   melatonin  3 mg Oral QHS   midodrine  10 mg Oral Q M,W,F-HD   midodrine  10 mg Oral Once in dialysis   multivitamin  1 tablet Oral QHS   pantoprazole  40 mg Oral Daily   polyethylene glycol  17 g Oral  Daily   polyvinyl alcohol  2 drop Both Eyes TID   saccharomyces boulardii  250 mg Oral BID   sevelamer carbonate  1,600 mg Oral TID WC   ticagrelor  90 mg Oral BID     Madelon Lips MD 07/28/2022, 9:06 AM

## 2022-07-28 NOTE — Evaluation (Signed)
Physical Therapy Evaluation Patient Details Name: Rodney Farley MRN: [id number] DOB: 04-07-1932 Today's Date: 07/28/2022  History of Present Illness  Pt is a 89 y.o. M who presents from [facility] 07/26/2022 with chest pain and SOB. Pt found to have hypoxic. CTA revealed pleural effusions R > L. Significant PMH: anxiety/depression, chronic systolic heart failure, COPD, diabetes mellitus, peripheral neuropathy, AICD, recent left trimalleolar ankle fx s/p intramedullary implant.  Clinical Impression  PTA, pt receiving therapy at [facility], was modI with w/c and toilet transfers, and requiring assist for LB ADL's. Pt pleasant and agreeable to participate in therapy evaluation. Pt with increased tachypnea upon sitting edge of bed, SPO2 96% on RA. Pt ambulating room distances with a walker at a min assist level; L CAM boot donned. Recommend return to SNF to maximize functional independence prior to transitioning back to his apartment.      Recommendations for follow up therapy are one component of a multi-disciplinary discharge planning process, led by the attending physician.  Recommendations may be updated based on patient status, additional functional criteria and insurance authorization.  Follow Up Recommendations Skilled nursing-short term rehab (<3 hours/day) (at [facility]) Can patient physically be transported by private vehicle: Yes    Assistance Recommended at Discharge Intermittent Supervision/Assistance  Patient can return home with the following  A little help with walking and/or transfers;A little help with bathing/dressing/bathroom;Assistance with cooking/housework;Assist for transportation;Help with stairs or ramp for entrance    Equipment Recommendations None recommended by PT  Recommendations for Other Services       Functional Status Assessment Patient has had a recent decline in their functional status and demonstrates the ability to make significant improvements in  function in a reasonable and predictable amount of time.     Precautions / Restrictions Precautions Precautions: Fall Required Braces or Orthoses: Other Brace Other Brace: L CAM boot Restrictions Weight Bearing Restrictions: No Other Position/Activity Restrictions: Per pt, cleared for WBAT in CAM boot 1/13      Mobility  Bed Mobility Overal bed mobility: Needs Assistance Bed Mobility: Supine to Sit, Sit to Supine     Supine to sit: Supervision Sit to supine: Supervision   General bed mobility comments: No physical assist. Tachypnea upon initially sitting up    Transfers Overall transfer level: Needs assistance Equipment used: Rolling walker (2 wheels) Transfers: Sit to/from Stand Sit to Stand: Min guard           General transfer comment: Min guard for safety to rise from edge of bed    Ambulation/Gait Ambulation/Gait assistance: Min assist Gait Distance (Feet): 15 Feet Assistive device: Rolling walker (2 wheels) Gait Pattern/deviations: Step-through pattern, Decreased stride length, Narrow base of support Gait velocity: decreased Gait velocity interpretation: <1.8 ft/sec, indicate of risk for recurrent falls   General Gait Details: Cues for wider BOS, minA for instability during turns  Science writer    Modified Rankin (Stroke Patients Only)       Balance Overall balance assessment: Needs assistance Sitting-balance support: Feet supported Sitting balance-Leahy Scale: Fair     Standing balance support: Bilateral upper extremity supported Standing balance-Leahy Scale: Poor Standing balance comment: reliant on RW                             Pertinent Vitals/Pain Pain Assessment Pain Assessment: No/denies pain    Home Living Family/patient expects to be discharged  to:: Skilled nursing facility                   Additional Comments: [facility]    Prior Function Prior Level of Function : Needs  assist             Mobility Comments: modI w/c, toilet transfers at SNF ADLs Comments: requires assist for LB dressing at SNF     Hand Dominance   Dominant Hand: Right    Extremity/Trunk Assessment   Upper Extremity Assessment Upper Extremity Assessment: Defer to OT evaluation    Lower Extremity Assessment Lower Extremity Assessment: RLE deficits/detail;LLE deficits/detail RLE Deficits / Details: Strength 5/5 LLE Deficits / Details: Ankle fx 05/2023. Hip/knee strength 5/5    Cervical / Trunk Assessment Cervical / Trunk Assessment: Kyphotic  Communication   Communication: HOH  Cognition Arousal/Alertness: Awake/alert Behavior During Therapy: WFL for tasks assessed/performed Overall Cognitive Status: Within Functional Limits for tasks assessed                                          General Comments      Exercises     Assessment/Plan    PT Assessment Patient needs continued PT services  PT Problem List Decreased strength;Decreased activity tolerance;Decreased balance;Decreased mobility;Cardiopulmonary status limiting activity       PT Treatment Interventions Gait training;DME instruction;Functional mobility training;Therapeutic activities;Therapeutic exercise;Balance training;Patient/family education    PT Goals (Current goals can be found in the Care Plan section)  Acute Rehab PT Goals Patient Stated Goal: to go back to [facility] until February and then transition to apartment PT Goal Formulation: With patient Time For Goal Achievement: 08/11/22 Potential to Achieve Goals: Good    Frequency Min 2X/week     Co-evaluation PT/OT/SLP Co-Evaluation/Treatment: Yes Reason for Co-Treatment: For patient/therapist safety;To address functional/ADL transfers PT goals addressed during session: Mobility/safety with mobility         AM-PAC PT "6 Clicks" Mobility  Outcome Measure Help needed turning from your back to your side while in a  flat bed without using bedrails?: None Help needed moving from lying on your back to sitting on the side of a flat bed without using bedrails?: A Little Help needed moving to and from a bed to a chair (including a wheelchair)?: A Little Help needed standing up from a chair using your arms (e.g., wheelchair or bedside chair)?: A Little Help needed to walk in hospital room?: A Little Help needed climbing 3-5 steps with a railing? : A Lot 6 Click Score: 18    End of Session Equipment Utilized During Treatment: Other (comment) (L CAM boot) Activity Tolerance: Patient tolerated treatment well Patient left: in bed;with call bell/phone within reach Nurse Communication: Mobility status PT Visit Diagnosis: Difficulty in walking, not elsewhere classified (R26.2);Unsteadiness on feet (R26.81)    Time: 6195-0932 PT Time Calculation (min) (ACUTE ONLY): 21 min   Charges:   PT Evaluation $PT Eval Low Complexity: Pe Ell, PT, DPT Acute Rehabilitation Services Office [phone]   Deno Etienne 07/28/2022, 2:22 PM

## 2022-07-28 NOTE — Progress Notes (Signed)
Pt receives out-pt HD at [facility] on MWF. Pt arrives at 11:00 for 11:20 chair time. Appears pt admitted from a facility per chart review. Will assist as needed.   Melven Sartorius Renal Navigator [phone]

## 2022-07-28 NOTE — Progress Notes (Signed)
Complained of feeling short of breath, pulse ox -97% on room air, requested for oxygen- 2L [place]. Atarax given for anxiety. Continue to monitor.

## 2022-07-29 ENCOUNTER — Inpatient Hospital Stay (HOSPITAL_COMMUNITY): Payer: PPO

## 2022-07-29 DIAGNOSIS — I509 Heart failure, unspecified: Secondary | ICD-10-CM

## 2022-07-29 DIAGNOSIS — I5023 Acute on chronic systolic (congestive) heart failure: Secondary | ICD-10-CM | POA: Diagnosis not present

## 2022-07-29 LAB — CBC
HCT: 25 % — ABNORMAL LOW (ref 39.0–52.0)
Hemoglobin: 8.2 g/dL — ABNORMAL LOW (ref 13.0–17.0)
MCH: 33.1 pg (ref 26.0–34.0)
MCHC: 32.8 g/dL (ref 30.0–36.0)
MCV: 100.8 fL — ABNORMAL HIGH (ref 80.0–100.0)
Platelets: 224 K/uL (ref 150–400)
RBC: 2.48 MIL/uL — ABNORMAL LOW (ref 4.22–5.81)
RDW: 17 % — ABNORMAL HIGH (ref 11.5–15.5)
WBC: 8.3 K/uL (ref 4.0–10.5)
nRBC: 0 % (ref 0.0–0.2)

## 2022-07-29 LAB — RENAL FUNCTION PANEL
Albumin: 2.9 g/dL — ABNORMAL LOW (ref 3.5–5.0)
Anion gap: 15 (ref 5–15)
BUN: 20 mg/dL (ref 8–23)
CO2: 28 mmol/L (ref 22–32)
Calcium: 9.2 mg/dL (ref 8.9–10.3)
Chloride: 93 mmol/L — ABNORMAL LOW (ref 98–111)
Creatinine, Ser: 4.01 mg/dL — ABNORMAL HIGH (ref 0.61–1.24)
GFR, Estimated: 14 mL/min — ABNORMAL LOW (ref >60)
Glucose, Bld: 99 mg/dL (ref 70–99)
Phosphorus: 3.2 mg/dL (ref 2.5–4.6)
Potassium: 3.7 mmol/L (ref 3.5–5.1)
Sodium: 136 mmol/L (ref 135–145)

## 2022-07-29 LAB — HEMOGLOBIN AND HEMATOCRIT, BLOOD
HCT: 26.1 % — ABNORMAL LOW (ref 39.0–52.0)
Hemoglobin: 8.4 g/dL — ABNORMAL LOW (ref 13.0–17.0)

## 2022-07-29 MED ORDER — HEPARIN SODIUM (PORCINE) 1000 UNIT/ML IJ SOLN
INTRAMUSCULAR | Status: AC
  Administered 2022-07-29: 2500 [IU]
  Filled 2022-07-29: qty 3

## 2022-07-29 MED ORDER — HEPARIN SODIUM (PORCINE) 1000 UNIT/ML IJ SOLN
INTRAMUSCULAR | Status: AC
  Filled 2022-07-29: qty 3

## 2022-07-29 MED ORDER — TRAZODONE HCL 50 MG PO TABS
25.0000 mg | ORAL_TABLET | Freq: Every day | ORAL | Status: AC
  Administered 2022-07-29: 25 mg via ORAL
  Filled 2022-07-29: qty 1

## 2022-07-29 MED ORDER — MIDODRINE HCL 5 MG PO TABS
10.0000 mg | ORAL_TABLET | Freq: Once | ORAL | Status: AC
  Administered 2022-07-29: 10 mg via ORAL
  Filled 2022-07-29: qty 2

## 2022-07-29 MED ORDER — ALBUMIN HUMAN 25 % IV SOLN
25.0000 g | Freq: Once | INTRAVENOUS | Status: AC
  Administered 2022-07-29: 25 g via INTRAVENOUS
  Filled 2022-07-29: qty 100

## 2022-07-29 NOTE — Progress Notes (Signed)
Cardiology Progress Note  Patient ID: Rodney Farley MRN: [id number] DOB: 28-Aug-1931 Date of Encounter: 07/29/2022  Primary Cardiologist: Rodney [name], MD  Subjective   Chief Complaint: SOB  HPI: Chest x-ray with persistent pulmonary edema.  Still feeling short of breath with laying down.  3.5 L of fluid removed from hemodialysis.  Blood pressure soft overnight.  Holding home medications.  ROS:  All other ROS reviewed and negative. Pertinent positives noted in the HPI.     Inpatient Medications  Scheduled Meds:  aspirin EC  81 mg Oral Daily   atorvastatin  80 mg Oral QHS   Chlorhexidine Gluconate Cloth  6 each Topical Q0600   cyanocobalamin  1,000 mcg Oral Daily   darbepoetin (ARANESP) injection - DIALYSIS  150 mcg Subcutaneous Q Mon-1800   docusate sodium  100 mg Oral BID   ezetimibe  10 mg Oral Daily   gabapentin  100 mg Oral QHS   heparin  5,000 Units Subcutaneous Q8H   isosorbide mononitrate  15 mg Oral Daily   melatonin  3 mg Oral QHS   midodrine  10 mg Oral Q M,W,F-HD   multivitamin  1 tablet Oral QHS   pantoprazole  40 mg Oral Daily   polyethylene glycol  17 g Oral Daily   polyvinyl alcohol  2 drop Both Eyes TID   saccharomyces boulardii  250 mg Oral BID   sevelamer carbonate  1,600 mg Oral TID WC   ticagrelor  90 mg Oral BID   Continuous Infusions:  promethazine (PHENERGAN) injection (IM or IVPB) Stopped (07/28/22 0807)   PRN Meds: acetaminophen **OR** acetaminophen, HYDROcodone-acetaminophen, hydrOXYzine, nitroGLYCERIN, mouth rinse, promethazine (PHENERGAN) injection (IM or IVPB), senna-docusate   Vital Signs   Vitals:   07/29/22 0306 07/29/22 0422 07/29/22 0553 07/29/22 0807  BP: (!) 80/41 (!) 92/51 (!) 96/53 (!) 94/47  Pulse: 67 72 76 80  Resp: 20 20  (!) 21  Temp: 98.2 F (36.8 C)   97.8 F (36.6 C)  TempSrc: Oral   Oral  SpO2: 95% 96%  95%  Weight:      Height:        Intake/Output Summary (Last 24 hours) at 07/29/2022 0858 Last data  filed at 07/28/2022 1900 Gross per 24 hour  Intake 101.83 ml  Output 3500 ml  Net -3398.17 ml      07/28/2022   11:42 AM 07/28/2022    7:40 AM 07/27/2022    6:24 PM  Last 3 Weights  Weight (lbs) 195 lb 15.8 oz 200 lb 9.9 oz 186 lb 8.2 oz  Weight (kg) 88.9 kg 91 kg 84.6 kg      Telemetry  Overnight telemetry shows AV paced rhythm heart rate 80, which I personally reviewed.   Physical Exam   Vitals:   07/29/22 0306 07/29/22 0422 07/29/22 0553 07/29/22 0807  BP: (!) 80/41 (!) 92/51 (!) 96/53 (!) 94/47  Pulse: 67 72 76 80  Resp: 20 20  (!) 21  Temp: 98.2 F (36.8 C)   97.8 F (36.6 C)  TempSrc: Oral   Oral  SpO2: 95% 96%  95%  Weight:      Height:        Intake/Output Summary (Last 24 hours) at 07/29/2022 0858 Last data filed at 07/28/2022 1900 Gross per 24 hour  Intake 101.83 ml  Output 3500 ml  Net -3398.17 ml       07/28/2022   11:42 AM 07/28/2022    7:40 AM 07/27/2022    6:24  PM  Last 3 Weights  Weight (lbs) 195 lb 15.8 oz 200 lb 9.9 oz 186 lb 8.2 oz  Weight (kg) 88.9 kg 91 kg 84.6 kg    Body mass index is 30.7 kg/m.  General: Well nourished, well developed, in no acute distress Head: Atraumatic, normal size  Eyes: PEERLA, EOMI  Neck: Supple, JVD 8 to 10 cm of water Endocrine: No thryomegaly Cardiac: Normal S1, S2; RRR; no murmurs, rubs, or gallops Lungs: Diminished breath sounds, rales bilaterally Abd: Soft, nontender, no hepatomegaly  Ext: No edema, pulses 2+ Musculoskeletal: No deformities, BUE and BLE strength normal and equal Skin: Warm and dry, no rashes   Neuro: Alert and oriented to person, place, time, and situation, CNII-XII grossly intact, no focal deficits  Psych: Normal mood and affect   Labs  High Sensitivity Troponin:   Recent Labs  Lab 07/26/22 1505 07/26/22 1705  TROPONINIHS 1,379* 1,334*     Cardiac EnzymesNo results for input(s): "TROPONINI" in the last 168 hours. No results for input(s): "TROPIPOC" in the last 168 hours.   Chemistry Recent Labs  Lab 07/27/22 0005 07/28/22 0019 07/29/22 0028  NA 136 135 136  K 4.5 3.5 3.7  CL 94* 94* 93*  CO2 28 30 28   GLUCOSE 116* 138* 99  BUN 34* 27* 20  CREATININE 6.54*  6.51* 5.12* 4.01*  CALCIUM 8.7* 9.1 9.2  ALBUMIN 3.0* 2.8* 2.9*  GFRNONAA 8*  8* 10* 14*  ANIONGAP 14 11 15     Hematology Recent Labs  Lab 07/26/22 1505 07/26/22 1618 07/27/22 0005 07/28/22 0019 07/29/22 0028  WBC 14.2*  --  11.8* 9.3  --   RBC 2.33*  --  2.37* 2.47*  --   HGB 7.7*   < > 7.7* 7.9* 8.4*  HCT 24.1*   < > 24.0* 24.8* 26.1*  MCV 103.4*  --  101.3* 100.4*  --   MCH 33.0  --  32.5 32.0  --   MCHC 32.0  --  32.1 31.9  --   RDW 17.2*  --  17.3* 17.3*  --   PLT 179  --  178 178  --    < > = values in this interval not displayed.   BNP Recent Labs  Lab 07/26/22 1505  BNP 1,410.5*    DDimer  Recent Labs  Lab 07/26/22 1526  DDIMER 1.20*     Radiology  DG CHEST PORT 1 VIEW  Result Date: 07/29/2022 CLINICAL DATA:  Shortness of breath EXAM: PORTABLE CHEST 1 VIEW COMPARISON:  Portable exam 9629 hours compared to 07/26/2022 FINDINGS: LEFT subclavian ICD leads project over RIGHT atrium, RIGHT ventricle, and coronary sinus. Enlargement of cardiac silhouette with pulmonary vascular congestion. Diffuse interstitial infiltrate consistent with pulmonary edema. No pleural effusion or pneumothorax. Atherosclerotic calcifications aortic arch. Bones demineralized. IMPRESSION: Pulmonary edema/CHF. Aortic Atherosclerosis (ICD10-I70.0). Electronically Signed   By: Lavonia Dana M.D.   On: 07/29/2022 08:41   ECHOCARDIOGRAM COMPLETE  Result Date: 07/27/2022    ECHOCARDIOGRAM REPORT   Patient Name:   Rodney Farley Date of Exam: 07/27/2022 Medical Rec #:  [id number]             Height:       67.0 in Accession #:    [id number]            Weight:       200.0 lb Date of Birth:  [birth year]             BSA:  2.022 m Patient Age:    89 years              BP:           95/46 mmHg  Patient Gender: M                     HR:           90 bpm. Exam Location:  Inpatient Procedure: 2D Echo, Color Doppler, Cardiac Doppler and Intracardiac            Opacification Agent Indications:    Y18.56 Acute systolic (congestive) heart failure  History:        Patient has prior history of Echocardiogram examinations, most                 recent 05/07/2022. CHF, Pacemaker and Defibrillator, COPD and                 ESRD, Arrythmias:Atrial Fibrillation and AV Block; Risk                 Factors:Hypertension, Diabetes, Dyslipidemia and Sleep Apnea.  Sonographer:    Raquel Sarna Senior RDCS Referring Phys: [id number] [place]  1. Septal apical akinesis distal anterior wall and inferior apical hypokinesis . Left ventricular ejection fraction, by estimation, is 40 to 45%. The left ventricle has mildly decreased function. The left ventricle has no regional wall motion abnormalities. The left ventricular internal cavity size was moderately dilated. Left ventricular diastolic parameters are consistent with Grade II diastolic dysfunction (pseudonormalization). Elevated left ventricular end-diastolic pressure.  2. Cathers in RA/RV . Right ventricular systolic function is normal. The right ventricular size is normal. There is moderately elevated pulmonary artery systolic pressure.  3. Left atrial size was severely dilated.  4. The mitral valve is abnormal. Mild mitral valve regurgitation. No evidence of mitral stenosis. Severe mitral annular calcification.  5. The aortic valve is tricuspid. There is moderate calcification of the aortic valve. There is moderate thickening of the aortic valve. Aortic valve regurgitation is not visualized. Moderate aortic valve stenosis.  6. The inferior vena cava is dilated in size with <50% respiratory variability, suggesting right atrial pressure of 15 mmHg. FINDINGS  Left Ventricle: Septal apical akinesis distal anterior wall and inferior apical hypokinesis. Left  ventricular ejection fraction, by estimation, is 40 to 45%. The left ventricle has mildly decreased function. The left ventricle has no regional wall motion abnormalities. Definity contrast agent was given IV to delineate the left ventricular endocardial borders. The left ventricular internal cavity size was moderately dilated. There is no left ventricular hypertrophy. Left ventricular diastolic parameters are consistent with Grade II diastolic dysfunction (pseudonormalization). Elevated left ventricular end-diastolic pressure. Right Ventricle: Cathers in RA/RV. The right ventricular size is normal. No increase in right ventricular wall thickness. Right ventricular systolic function is normal. There is moderately elevated pulmonary artery systolic pressure. The tricuspid regurgitant velocity is 2.80 m/s, and with an assumed right atrial pressure of 15 mmHg, the estimated right ventricular systolic pressure is 63.7 mmHg. Left Atrium: Left atrial size was severely dilated. Right Atrium: Right atrial size was normal in size. Pericardium: There is no evidence of pericardial effusion. Mitral Valve: The mitral valve is abnormal. There is moderate thickening of the mitral valve leaflet(s). There is moderate calcification of the mitral valve leaflet(s). Severe mitral annular calcification. Mild mitral valve regurgitation. No evidence of mitral valve stenosis. MV peak gradient, 7.6 mmHg. The mean mitral valve gradient  is 3.0 mmHg. Tricuspid Valve: The tricuspid valve is normal in structure. Tricuspid valve regurgitation is mild . No evidence of tricuspid stenosis. Aortic Valve: The aortic valve is tricuspid. There is moderate calcification of the aortic valve. There is moderate thickening of the aortic valve. Aortic valve regurgitation is not visualized. Moderate aortic stenosis is present. Aortic valve mean gradient measures 20.0 mmHg. Aortic valve peak gradient measures 31.8 mmHg. Aortic valve area, by VTI measures 1.20  cm. Pulmonic Valve: The pulmonic valve was normal in structure. Pulmonic valve regurgitation is mild. No evidence of pulmonic stenosis. Aorta: The aortic root is normal in size and structure. Venous: The inferior vena cava is dilated in size with less than 50% respiratory variability, suggesting right atrial pressure of 15 mmHg. IAS/Shunts: No atrial level shunt detected by color flow Doppler.  LEFT VENTRICLE PLAX 2D LVIDd:         5.70 cm      Diastology LVIDs:         4.40 cm      LV e' medial:    6.37 cm/s LV PW:         0.90 cm      LV E/e' medial:  20.3 LV IVS:        0.70 cm      LV e' lateral:   5.44 cm/s LVOT diam:     2.10 cm      LV E/e' lateral: 23.7 LV SV:         71 LV SV Index:   35 LVOT Area:     3.46 cm  LV Volumes (MOD) LV vol d, MOD A2C: 130.0 ml LV vol d, MOD A4C: 152.0 ml LV vol s, MOD A2C: 78.2 ml LV vol s, MOD A4C: 103.0 ml LV SV MOD A2C:     51.8 ml LV SV MOD A4C:     152.0 ml LV SV MOD BP:      51.9 ml RIGHT VENTRICLE RV S prime:     13.10 cm/s TAPSE (M-mode): 1.8 cm LEFT ATRIUM              Index        RIGHT ATRIUM           Index LA diam:        5.30 cm  2.62 cm/m   RA Area:     18.30 cm LA Vol (A2C):   83.3 ml  41.19 ml/m  RA Volume:   53.00 ml  26.21 ml/m LA Vol (A4C):   107.0 ml 52.91 ml/m LA Biplane Vol: 98.1 ml  48.51 ml/m  AORTIC VALVE AV Area (Vmax):    1.29 cm AV Area (Vmean):   1.29 cm AV Area (VTI):     1.20 cm AV Vmax:           282.00 cm/s AV Vmean:          214.000 cm/s AV VTI:            0.597 m AV Peak Grad:      31.8 mmHg AV Mean Grad:      20.0 mmHg LVOT Vmax:         105.00 cm/s LVOT Vmean:        79.700 cm/s LVOT VTI:          0.206 m LVOT/AV VTI ratio: 0.35  AORTA Ao Root diam: 2.90 cm Ao Asc diam:  2.90 cm MITRAL VALVE  TRICUSPID VALVE MV Area (PHT): 4.83 cm     TR Peak grad:   31.4 mmHg MV Area VTI:   2.64 cm     TR Vmax:        280.00 cm/s MV Peak grad:  7.6 mmHg MV Mean grad:  3.0 mmHg     SHUNTS MV Vmax:       1.38 m/s     Systemic VTI:   0.21 m MV Vmean:      84.8 cm/s    Systemic Diam: 2.10 cm MV Decel Time: 157 msec MV E velocity: 129.00 cm/s MV A velocity: 92.40 cm/s MV E/A ratio:  1.40 Jenkins Rouge MD Electronically signed by Jenkins Rouge MD Signature Date/Time: 07/27/2022/2:08:07 PM    Final     Cardiac Studies  TTE 07/27/2022  1. Septal apical akinesis distal anterior wall and inferior apical  hypokinesis . Left ventricular ejection fraction, by estimation, is 40 to  45%. The left ventricle has mildly decreased function. The left ventricle  has no regional wall motion  abnormalities. The left ventricular internal cavity size was moderately  dilated. Left ventricular diastolic parameters are consistent with Grade  II diastolic dysfunction (pseudonormalization). Elevated left ventricular  end-diastolic pressure.   2. Cathers in RA/RV . Right ventricular systolic function is normal. The  right ventricular size is normal. There is moderately elevated pulmonary  artery systolic pressure.   3. Left atrial size was severely dilated.   4. The mitral valve is abnormal. Mild mitral valve regurgitation. No  evidence of mitral stenosis. Severe mitral annular calcification.   5. The aortic valve is tricuspid. There is moderate calcification of the  aortic valve. There is moderate thickening of the aortic valve. Aortic  valve regurgitation is not visualized. Moderate aortic valve stenosis.   6. The inferior vena cava is dilated in size with <50% respiratory  variability, suggesting right atrial pressure of 15 mmHg.   Patient Profile  Thurston Brendlinger is a 89 y.o. male with systolic heart failure (EF 45 to 50%), CAD status post PCI, ESRD on hemodialysis, COPD, diabetes who was admitted on 3/55/7322 for acute systolic heart failure and volume overload.  Cardiology consulted for elevated troponin value.    Assessment & Plan   # Acute hypoxic respiratory failure # Acute systolic heart failure, EF 40% with regional wall motion  abnormality # ESRD hemodialysis # Hypotension -Remains with pulmonary edema on chest x-ray and exam.  His breathing has improved.  Saturations are 96% on 2 L. -Blood pressure is low.  Discussed with the patient and his daughter-in-law that medical management is the only option for him.  He did undergo a difficult intervention to the ostial LAD in October.  No further options.  Has significant in-stent restenosis. -We will hold his congestive heart failure medications in the setting of hypotension.  Blood pressure is always chronically low per discussion with daughter-in-law.  We also discussed that the approach is palliative.  He is an uric.  All we can really do is pleural fluid with hemodialysis.  Will continue midodrine.  Cannot be on digoxin due to ESRD. -He is DNR/DNI. -Would recommend to continue with hemodialysis due to persistent pulmonary edema. -Not a candidate for advanced heart failure therapies. -Approach is palliative.  # Elevated troponin, demand ischemia # CAD status post PCI -Recent left heart cath in October with in-stent restenosis of the ostial LAD stent.  Underwent balloon angioplasty.  After discussion with interventional cardiology he has no further  options.  Echocardiogram has wall motion abnormality in the LAD distribution.  Ejection fraction is slightly reduced from prior values. -Now here with worsening heart failure. -Again, as detailed above we have limited options.  He is not a candidate for invasive angiography.  He is not a candidate for bypass surgery.  His blood pressure precludes beta-blocker.  He is without chest pain.  His symptoms are congestive heart failure related.  Would continue with aggressive dialysis as we are able. -Continue aspirin, Brilinta.  He is on a statin and Zetia.  On Repatha as an outpatient.  # Anemia # ESRD -Per renal  # Chronic hypotension -On midodrine.  Congestive heart failure is not helping.  Not a candidate for any advanced  therapies for his heart failure.  [facility] will sign off.   Medication Recommendations: As above Other recommendations (labs, testing, etc): None Follow up as an outpatient: We will arrange outpatient follow-up in 4 to 6 weeks with Dr. [name]  For questions or updates, please contact [facility] Please consult [email or web address] for contact info under    Signed, [place] B[place] T. Audie Box, MD, [place]  07/29/2022 8:58 AM

## 2022-07-29 NOTE — Progress Notes (Signed)
BP 80/41. Pt resting comfortably and is asymptomatic.  Paged Dr. Myna Hidalgo. Orders received for midodrine now and to continue monitoring BP.

## 2022-07-29 NOTE — Progress Notes (Signed)
Received patient in bed to unit.  Alert and oriented.  Informed consent signed and in chart.   Treatment initiated: 1426 Treatment completed: 1630  Patient didn't tolerate treatment well. Blood pressure dropped throughout the entire treatment, 200 Ml bolus given + Albumin and BP remained in the 90's/systolic. Patient cried several times throughout treatment due to his anxiety. Anxiety medicine didn't help, patient requesting additional doses. Patient signed out the last 37 minutes of treatment.    Transported back to the room  Alert, without acute distress.  Hand-off given to patient's nurse.   Access used: AVG Access issues: n/a  Total UF removed: 400 Medication(s) given: Albumin, 200 Ml bolus, Atarax Post HD weight: [facility]

## 2022-07-29 NOTE — Progress Notes (Signed)
PROGRESS NOTE    Rodney Farley  GYK:[id number] DOB: 08-22-1931 DOA: 07/26/2022 PCP: [name], MD    Brief Narrative:  Rodney Farley is an 89 year old male with past medical history of anxiety and depression, chronic systolic heart failure, COPD, diabetes mellitus, peripheral neuropathy, hyperlipidemia, hypertension, sleep apnea, AICD who presented from skilled nursing facility on 1/14 with chest pain and shortness of breath.  He was found to be hypoxic. CTA showed no PE or pneumonia, but did reveal pleural effusions R > L and appeared volume overloaded. HD was performed with clinical improvement.  Assessment & Plan:   Acute hypoxic respiratory failure due to pulmonary edema and pleural effusions, acute on chronic HFrEF: No infectious symptoms, RSV, flu, covid panel negative. - Continue volume management with HD per nephrology. Performed 1/15, extra 1/16, next today. - Repeat echo is relatively unchanged with stable EF 40-45%, LAD distribution hypokinesis. Cardiology discussed with patient, family, and plans on optimizing medical management for now.  - Adding back low dose coreg (hypotension limiting GDMT). Continue imdur 15mg , hydralazine 10mg  TID. - Symptomatically improving.  Additional dialysis today. -Will recheck x-ray after dialysis to see if he would benefit with thoracentesis. Today on room air.  CAD, ICM, s/p AICD, HLD: Troponin elevation currently suspected to be due to demand ischemia in setting of acute CHF and ESRD. History of ISR of ostial LAD stent s/p balloon angioplasty Oct 2023 with limited options. Echo with stable WMA. - Appreciate cardiology assessment and recommendations. Medical management. - Continue BB, long acting nitrate, ASA, brilinta, not starting heparin at this time.  - Continue statin, zetia, outpatient repatha.    ESRD, anemia of ESRD, secondary hyperparathyroidism:  - HD per nephrology, typically MWF schedule. - ESA 1/16, continue  renvela per nephrology   History of left ankle fracture: Nov 2023.  -Discussed with orthopedics.  Weightbearing as tolerated with Cam boot.   Chronic hypotension:  - Continue intradialytic midodrine (MWF)   GERD:  - PPI   OSA:  - CPAP  Additional dialysis today.  Anticipate going back to short-term rehab tomorrow.  DVT prophylaxis: heparin injection 5,000 Units Start: 07/27/22 1400   Code Status: DNR Family Communication: None at the bedside Disposition Plan: Status is: Inpatient Remains inpatient appropriate because: Volume overloaded, dialysis need     Consultants:  Cardiology Nephrology  Procedures:  Hemodialysis  Antimicrobials:  None   Subjective: Patient examined in the morning rounds.  On room air.  He does complain of occasional chest pain and shortness of breath.  He was more concerned about his operated ankle on the left as therapist removed his cam boot. Discussed with nephrology for additional hemodialysis today to put him back on his schedule.  Called and discussed with orthopedics, recommended weightbearing as tolerated.  Objective: Vitals:   07/29/22 0553 07/29/22 0807 07/29/22 0900 07/29/22 1047  BP: (!) 96/53 (!) 94/47 (!) 107/56 (!) 93/48  Pulse: 76 80 80 80  Resp:  (!) 21 20 18   Temp:  97.8 F (36.6 C) 98 F (36.7 C) 97.8 F (36.6 C)  TempSrc:  Oral Oral Oral  SpO2:  95% 98% 93%  Weight:      Height:        Intake/Output Summary (Last 24 hours) at 07/29/2022 1154 Last data filed at 07/28/2022 1900 Gross per 24 hour  Intake 101.83 ml  Output --  Net 101.83 ml   Filed Weights   07/27/22 1824 07/28/22 0740 07/28/22 1142  Weight: 84.6 kg 91  kg 88.9 kg    Examination:  General exam: Appears calm, anxious at times. Alert and awake.  Oriented. Respiratory system: Clear to auscultation. Respiratory effort normal.  No added sounds. Cardiovascular system: S1 & S2 heard, RRR.  Gastrointestinal system: Abdomen is nondistended, soft and  nontender. No organomegaly or masses felt. Normal bowel sounds heard. Central nervous system: Alert and oriented. No focal neurological deficits. Extremities: Symmetric 5 x 5 power. Multiple ecchymotic areas on the left leg. AV fistula right arm with thrill. Pacemaker left precordium.     Data Reviewed: I have personally reviewed following labs and imaging studies  CBC: Recent Labs  Lab 07/26/22 1505 07/26/22 1618 07/26/22 2211 07/27/22 0005 07/28/22 0019 07/29/22 0028  WBC 14.2*  --   --  11.8* 9.3  --   NEUTROABS 11.0*  --   --   --   --   --   HGB 7.7* 8.2* 8.5* 7.7* 7.9* 8.4*  HCT 24.1* 24.0* 25.0* 24.0* 24.8* 26.1*  MCV 103.4*  --   --  101.3* 100.4*  --   PLT 179  --   --  178 178  --    Basic Metabolic Panel: Recent Labs  Lab 07/26/22 1505 07/26/22 1526 07/26/22 1618 07/26/22 2211 07/27/22 0005 07/28/22 0019 07/29/22 0028  NA 135  --  137 137 136 135 136  K 4.1  --  4.1 4.3 4.5 3.5 3.7  CL 93*  --   --   --  94* 94* 93*  CO2 30  --   --   --  28 30 28   GLUCOSE 163*  --   --   --  116* 138* 99  BUN 31*  --   --   --  34* 27* 20  CREATININE 5.96*  --   --   --  6.54*  6.51* 5.12* 4.01*  CALCIUM 8.9  --   --   --  8.7* 9.1 9.2  MG  --  2.1  --   --  2.1  --   --   PHOS  --   --   --   --  4.8* 3.8 3.2   GFR: Estimated Creatinine Clearance: 13.5 mL/min (A) (by C-G formula based on SCr of 4.01 mg/dL (H)). Liver Function Tests: Recent Labs  Lab 07/27/22 0005 07/28/22 0019 07/29/22 0028  ALBUMIN 3.0* 2.8* 2.9*   No results for input(s): "LIPASE", "AMYLASE" in the last 168 hours. No results for input(s): "AMMONIA" in the last 168 hours. Coagulation Profile: No results for input(s): "INR", "PROTIME" in the last 168 hours. Cardiac Enzymes: No results for input(s): "CKTOTAL", "CKMB", "CKMBINDEX", "TROPONINI" in the last 168 hours. BNP (last 3 results) No results for input(s): "PROBNP" in the last 8760 hours. HbA1C: No results for input(s): "HGBA1C" in  the last 72 hours. CBG: No results for input(s): "GLUCAP" in the last 168 hours. Lipid Profile: No results for input(s): "CHOL", "HDL", "LDLCALC", "TRIG", "CHOLHDL", "LDLDIRECT" in the last 72 hours. Thyroid Function Tests: No results for input(s): "TSH", "T4TOTAL", "FREET4", "T3FREE", "THYROIDAB" in the last 72 hours. Anemia Panel: No results for input(s): "VITAMINB12", "FOLATE", "FERRITIN", "TIBC", "IRON", "RETICCTPCT" in the last 72 hours. Sepsis Labs: Recent Labs  Lab 07/26/22 1526 07/26/22 1934  LATICACIDVEN 2.8* 2.2*    Recent Results (from the past 240 hour(s))  Blood culture (routine x 2)     Status: None (Preliminary result)   Collection Time: 07/26/22  3:17 PM   Specimen: BLOOD LEFT FOREARM  Result Value Ref Range Status   Specimen Description BLOOD LEFT FOREARM  Final   Special Requests   Final    BOTTLES DRAWN AEROBIC AND ANAEROBIC Blood Culture adequate volume   Culture   Final    NO GROWTH 2 DAYS Performed at [facility], [address] [place], [place] [address]    Report Status PENDING  Incomplete  Resp panel by RT-PCR (RSV, Flu A&B, Covid) Nasopharyngeal Swab     Status: None   Collection Time: 07/26/22  4:14 PM   Specimen: Nasopharyngeal Swab; Nasal Swab  Result Value Ref Range Status   SARS Coronavirus 2 by RT PCR NEGATIVE NEGATIVE Final    Comment: (NOTE) SARS-CoV-2 target nucleic acids are NOT DETECTED.  The SARS-CoV-2 RNA is generally detectable in upper respiratory specimens during the acute phase of infection. The lowest concentration of SARS-CoV-2 viral copies this assay can detect is 138 copies/mL. A negative result does not preclude SARS-Cov-2 infection and should not be used as the sole basis for treatment or other patient management decisions. A negative result may occur with  improper specimen collection/handling, submission of specimen other than nasopharyngeal swab, presence of viral mutation(s) within the areas targeted by this  assay, and inadequate number of viral copies(<138 copies/mL). A negative result must be combined with clinical observations, patient history, and epidemiological information. The expected result is Negative.  Fact Sheet for Patients:  EntrepreneurPulse.com.au  Fact Sheet for Healthcare Providers:  IncredibleEmployment.be  This test is no t yet approved or cleared by the [place] FDA and  has been authorized for detection and/or diagnosis of SARS-CoV-2 by FDA under an Emergency Use Authorization (EUA). This EUA will remain  in effect (meaning this test can be used) for the duration of the COVID-19 declaration under Section 564(b)(1) of the Act, 21 U.S.C.section 360bbb-3(b)(1), unless the authorization is terminated  or revoked sooner.       Influenza A by PCR NEGATIVE NEGATIVE Final   Influenza B by PCR NEGATIVE NEGATIVE Final    Comment: (NOTE) The Xpert Xpress SARS-CoV-2/FLU/RSV plus assay is intended as an aid in the diagnosis of influenza from Nasopharyngeal swab specimens and should not be used as a sole basis for treatment. Nasal washings and aspirates are unacceptable for Xpert Xpress SARS-CoV-2/FLU/RSV testing.  Fact Sheet for Patients: EntrepreneurPulse.com.au  Fact Sheet for Healthcare Providers: IncredibleEmployment.be  This test is not yet approved or cleared by the [place] FDA and has been authorized for detection and/or diagnosis of SARS-CoV-2 by FDA under an Emergency Use Authorization (EUA). This EUA will remain in effect (meaning this test can be used) for the duration of the COVID-19 declaration under Section 564(b)(1) of the Act, 21 U.S.C. section 360bbb-3(b)(1), unless the authorization is terminated or revoked.     Resp Syncytial Virus by PCR NEGATIVE NEGATIVE Final    Comment: (NOTE) Fact Sheet for Patients: EntrepreneurPulse.com.au  Fact Sheet for  Healthcare Providers: IncredibleEmployment.be  This test is not yet approved or cleared by the [place] FDA and has been authorized for detection and/or diagnosis of SARS-CoV-2 by FDA under an Emergency Use Authorization (EUA). This EUA will remain in effect (meaning this test can be used) for the duration of the COVID-19 declaration under Section 564(b)(1) of the Act, 21 U.S.C. section 360bbb-3(b)(1), unless the authorization is terminated or revoked.  Performed at [facility], [place] [address] [place], [place] [address]   MRSA Next Gen by PCR, Nasal     Status:  None   Collection Time: 07/27/22  6:26 PM   Specimen: Nasal Mucosa; Nasal Swab  Result Value Ref Range Status   MRSA by PCR Next Gen NOT DETECTED NOT DETECTED Final    Comment: (NOTE) The GeneXpert MRSA Assay (FDA approved for NASAL specimens only), is one component of a comprehensive MRSA colonization surveillance program. It is not intended to diagnose MRSA infection nor to guide or monitor treatment for MRSA infections. Test performance is not FDA approved in patients less than 1 years old. Performed at [facility], [place] [address] [place], [place] [address]          Radiology Studies: DG CHEST PORT 1 VIEW  Result Date: 07/29/2022 CLINICAL DATA:  Shortness of breath EXAM: PORTABLE CHEST 1 VIEW COMPARISON:  Portable exam 9024 hours compared to 07/26/2022 FINDINGS: LEFT subclavian ICD leads project over RIGHT atrium, RIGHT ventricle, and coronary sinus. Enlargement of cardiac silhouette with pulmonary vascular congestion. Diffuse interstitial infiltrate consistent with pulmonary edema. No pleural effusion or pneumothorax. Atherosclerotic calcifications aortic arch. Bones demineralized. IMPRESSION: Pulmonary edema/CHF. Aortic Atherosclerosis (ICD10-I70.0). Electronically Signed   By: Lavonia Dana M.D.   On: 07/29/2022 08:41   ECHOCARDIOGRAM COMPLETE  Result Date: 07/27/2022     ECHOCARDIOGRAM REPORT   Patient Name:   JORDON KRISTIANSEN Date of Exam: 07/27/2022 Medical Rec #:  [id number]             Height:       67.0 in Accession #:    [id number]            Weight:       200.0 lb Date of Birth:  11-07-1931             BSA:          2.022 m Patient Age:    28 years              BP:           95/46 mmHg Patient Gender: M                     HR:           90 bpm. Exam Location:  Inpatient Procedure: 2D Echo, Color Doppler, Cardiac Doppler and Intracardiac            Opacification Agent Indications:    Q22.97 Acute systolic (congestive) heart failure  History:        Patient has prior history of Echocardiogram examinations, most                 recent 05/07/2022. CHF, Pacemaker and Defibrillator, COPD and                 ESRD, Arrythmias:Atrial Fibrillation and AV Block; Risk                 Factors:Hypertension, Diabetes, Dyslipidemia and Sleep Apnea.  Sonographer:    Raquel Sarna Senior RDCS Referring Phys: [id number] [place]  1. Septal apical akinesis distal anterior wall and inferior apical hypokinesis . Left ventricular ejection fraction, by estimation, is 40 to 45%. The left ventricle has mildly decreased function. The left ventricle has no regional wall motion abnormalities. The left ventricular internal cavity size was moderately dilated. Left ventricular diastolic parameters are consistent with Grade II diastolic dysfunction (pseudonormalization). Elevated left ventricular end-diastolic pressure.  2. Cathers in RA/RV . Right ventricular systolic function is normal. The right ventricular size is normal. There  is moderately elevated pulmonary artery systolic pressure.  3. Left atrial size was severely dilated.  4. The mitral valve is abnormal. Mild mitral valve regurgitation. No evidence of mitral stenosis. Severe mitral annular calcification.  5. The aortic valve is tricuspid. There is moderate calcification of the aortic valve. There is moderate thickening of the  aortic valve. Aortic valve regurgitation is not visualized. Moderate aortic valve stenosis.  6. The inferior vena cava is dilated in size with <50% respiratory variability, suggesting right atrial pressure of 15 mmHg. FINDINGS  Left Ventricle: Septal apical akinesis distal anterior wall and inferior apical hypokinesis. Left ventricular ejection fraction, by estimation, is 40 to 45%. The left ventricle has mildly decreased function. The left ventricle has no regional wall motion abnormalities. Definity contrast agent was given IV to delineate the left ventricular endocardial borders. The left ventricular internal cavity size was moderately dilated. There is no left ventricular hypertrophy. Left ventricular diastolic parameters are consistent with Grade II diastolic dysfunction (pseudonormalization). Elevated left ventricular end-diastolic pressure. Right Ventricle: Cathers in RA/RV. The right ventricular size is normal. No increase in right ventricular wall thickness. Right ventricular systolic function is normal. There is moderately elevated pulmonary artery systolic pressure. The tricuspid regurgitant velocity is 2.80 m/s, and with an assumed right atrial pressure of 15 mmHg, the estimated right ventricular systolic pressure is 22.0 mmHg. Left Atrium: Left atrial size was severely dilated. Right Atrium: Right atrial size was normal in size. Pericardium: There is no evidence of pericardial effusion. Mitral Valve: The mitral valve is abnormal. There is moderate thickening of the mitral valve leaflet(s). There is moderate calcification of the mitral valve leaflet(s). Severe mitral annular calcification. Mild mitral valve regurgitation. No evidence of mitral valve stenosis. MV peak gradient, 7.6 mmHg. The mean mitral valve gradient is 3.0 mmHg. Tricuspid Valve: The tricuspid valve is normal in structure. Tricuspid valve regurgitation is mild . No evidence of tricuspid stenosis. Aortic Valve: The aortic valve is  tricuspid. There is moderate calcification of the aortic valve. There is moderate thickening of the aortic valve. Aortic valve regurgitation is not visualized. Moderate aortic stenosis is present. Aortic valve mean gradient measures 20.0 mmHg. Aortic valve peak gradient measures 31.8 mmHg. Aortic valve area, by VTI measures 1.20 cm. Pulmonic Valve: The pulmonic valve was normal in structure. Pulmonic valve regurgitation is mild. No evidence of pulmonic stenosis. Aorta: The aortic root is normal in size and structure. Venous: The inferior vena cava is dilated in size with less than 50% respiratory variability, suggesting right atrial pressure of 15 mmHg. IAS/Shunts: No atrial level shunt detected by color flow Doppler.  LEFT VENTRICLE PLAX 2D LVIDd:         5.70 cm      Diastology LVIDs:         4.40 cm      LV e' medial:    6.37 cm/s LV PW:         0.90 cm      LV E/e' medial:  20.3 LV IVS:        0.70 cm      LV e' lateral:   5.44 cm/s LVOT diam:     2.10 cm      LV E/e' lateral: 23.7 LV SV:         71 LV SV Index:   35 LVOT Area:     3.46 cm  LV Volumes (MOD) LV vol d, MOD A2C: 130.0 ml LV vol d, MOD A4C: 152.0 ml LV  vol s, MOD A2C: 78.2 ml LV vol s, MOD A4C: 103.0 ml LV SV MOD A2C:     51.8 ml LV SV MOD A4C:     152.0 ml LV SV MOD BP:      51.9 ml RIGHT VENTRICLE RV S prime:     13.10 cm/s TAPSE (M-mode): 1.8 cm LEFT ATRIUM              Index        RIGHT ATRIUM           Index LA diam:        5.30 cm  2.62 cm/m   RA Area:     18.30 cm LA Vol (A2C):   83.3 ml  41.19 ml/m  RA Volume:   53.00 ml  26.21 ml/m LA Vol (A4C):   107.0 ml 52.91 ml/m LA Biplane Vol: 98.1 ml  48.51 ml/m  AORTIC VALVE AV Area (Vmax):    1.29 cm AV Area (Vmean):   1.29 cm AV Area (VTI):     1.20 cm AV Vmax:           282.00 cm/s AV Vmean:          214.000 cm/s AV VTI:            0.597 m AV Peak Grad:      31.8 mmHg AV Mean Grad:      20.0 mmHg LVOT Vmax:         105.00 cm/s LVOT Vmean:        79.700 cm/s LVOT VTI:          0.206 m  LVOT/AV VTI ratio: 0.35  AORTA Ao Root diam: 2.90 cm Ao Asc diam:  2.90 cm MITRAL VALVE                TRICUSPID VALVE MV Area (PHT): 4.83 cm     TR Peak grad:   31.4 mmHg MV Area VTI:   2.64 cm     TR Vmax:        280.00 cm/s MV Peak grad:  7.6 mmHg MV Mean grad:  3.0 mmHg     SHUNTS MV Vmax:       1.38 m/s     Systemic VTI:  0.21 m MV Vmean:      84.8 cm/s    Systemic Diam: 2.10 cm MV Decel Time: 157 msec MV E velocity: 129.00 cm/s MV A velocity: 92.40 cm/s MV E/A ratio:  1.40 Jenkins Rouge MD Electronically signed by Jenkins Rouge MD Signature Date/Time: 07/27/2022/2:08:07 PM    Final         Scheduled Meds:  aspirin EC  81 mg Oral Daily   atorvastatin  80 mg Oral QHS   Chlorhexidine Gluconate Cloth  6 each Topical Q0600   cyanocobalamin  1,000 mcg Oral Daily   darbepoetin (ARANESP) injection - DIALYSIS  150 mcg Subcutaneous Q Mon-1800   docusate sodium  100 mg Oral BID   ezetimibe  10 mg Oral Daily   gabapentin  100 mg Oral QHS   heparin  5,000 Units Subcutaneous Q8H   melatonin  3 mg Oral QHS   midodrine  10 mg Oral Q M,W,F-HD   multivitamin  1 tablet Oral QHS   pantoprazole  40 mg Oral Daily   polyethylene glycol  17 g Oral Daily   polyvinyl alcohol  2 drop Both Eyes TID   saccharomyces boulardii  250 mg Oral BID   sevelamer carbonate  1,600 mg Oral TID WC   ticagrelor  90 mg Oral BID   Continuous Infusions:  promethazine (PHENERGAN) injection (IM or IVPB) Stopped (07/28/22 0807)     LOS: 3 days    Time spent: 35 minutes    Barb Merino, MD [facility] Pager [phone]

## 2022-07-29 NOTE — Care Management Important Message (Signed)
Important Message  Patient Details  Name: Rodney Farley MRN: [id number] Date of Birth: January 26, 1932   Medicare Important Message Given:  Yes     Eliseo Withers 07/29/2022, 1:24 PM

## 2022-07-29 NOTE — Progress Notes (Signed)
[facility] Progress Note   Assessment/ Plan:   P HD: [facility] MWF  4h  400/1.5  94kg  2/2 bath  AVF  Hep 2500+ 1028midrun - venofer 100mg  tiw thru 1/24 - mircera 150 mcg q2, last 12/31, due 1/14 (today) - last HD 1/12, post wt 95.9kg   - dry wt recently raised 93 > 94kg   Assessment/ Plan: SOB/ resp distress - w/ bronchspasm per ED notes, also mild CHF by xray. +LE edema. HD with 3L off 07/27/22, Extra HD 1/16, short HD today to get back on schedule.  D/w primary- CXR after HD, then will decide if thora will help.   ESRD - on HD MWF. Has not missed HD. HD 1/15, 1/16 HTN/ volume - BP's wnl, vol excess as above.  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  MBD ckd - Ca in range, add on phos/ albumin.  CAD / sp AICD Elevated troponin: cardiology following, TTE with EF 45%, G2DD Dispo: pending  Subjective:    Seen in room.  HD yesterday with 3.5L off.  Legs look a lot better.     Objective:   BP (!) 93/48 (BP Location: Left Arm)   Pulse 80   Temp 97.8 F (36.6 C) (Oral)   Resp 18   Ht 5\' 7"  (1.702 m)   Wt 88.9 kg   SpO2 93%   BMI 30.70 kg/m   Physical Exam: Gen:NAD, sitting on edge of bed CVS: RRR II VI SEM Resp: muffled bibasilar breath sounds OIN:OMVE Ext: trace LE edema- much better ACCESS: L AVF  Labs: BMET Recent Labs  Lab 07/26/22 1505 07/26/22 1618 07/26/22 2211 07/27/22 0005 07/28/22 0019 07/29/22 0028  NA 135 137 137 136 135 136  K 4.1 4.1 4.3 4.5 3.5 3.7  CL 93*  --   --  94* 94* 93*  CO2 30  --   --  28 30 28   GLUCOSE 163*  --   --  116* 138* 99  BUN 31*  --   --  34* 27* 20  CREATININE 5.96*  --   --  6.54*  6.51* 5.12* 4.01*  CALCIUM 8.9  --   --  8.7* 9.1 9.2  PHOS  --   --   --  4.8* 3.8 3.2   CBC Recent Labs  Lab 07/26/22 1505 07/26/22 1618 07/26/22 2211 07/27/22 0005 07/28/22 0019 07/29/22 0028  WBC 14.2*  --   --  11.8* 9.3  --   NEUTROABS 11.0*  --   --   --   --   --   HGB 7.7*   < > 8.5*  7.7* 7.9* 8.4*  HCT 24.1*   < > 25.0* 24.0* 24.8* 26.1*  MCV 103.4*  --   --  101.3* 100.4*  --   PLT 179  --   --  178 178  --    < > = values in this interval not displayed.      Medications:     aspirin EC  81 mg Oral Daily   atorvastatin  80 mg Oral QHS   Chlorhexidine Gluconate Cloth  6 each Topical Q0600   cyanocobalamin  1,000 mcg Oral Daily   darbepoetin (ARANESP) injection - DIALYSIS  150 mcg Subcutaneous Q Mon-1800   docusate sodium  100 mg Oral BID   ezetimibe  10 mg Oral Daily   gabapentin  100 mg Oral QHS   heparin  5,000 Units Subcutaneous Q8H   melatonin  3 mg Oral QHS   midodrine  10 mg Oral Q M,W,F-HD   multivitamin  1 tablet Oral QHS   pantoprazole  40 mg Oral Daily   polyethylene glycol  17 g Oral Daily   polyvinyl alcohol  2 drop Both Eyes TID   saccharomyces boulardii  250 mg Oral BID   sevelamer carbonate  1,600 mg Oral TID WC   ticagrelor  90 mg Oral BID     Madelon Lips MD 07/29/2022, 11:52 AM

## 2022-07-29 NOTE — Progress Notes (Signed)
Mobility Specialist Progress Note    07/29/22 1046  Mobility  Activity Ambulated with assistance in hallway  Level of Assistance Minimal assist, patient does 75% or more  Assistive Device Front wheel walker  Distance Ambulated (ft) 60 ft  Activity Response Tolerated well  Mobility Referral Yes  $Mobility charge 1 Mobility   Pre-Mobility: 80 HR, 92/52 (63) BP, 93% SpO2 During Mobility: 88 HR, 90% SpO2 Post-Mobility: 81 HR, 97% SpO2  Pt received sitting EOB and agreeable. No complaints on walk. Returned to bed with call bell in reach.    Hildred Alamin Mobility Specialist  Please Psychologist, sport and exercise or Rehab Office at [phone]

## 2022-07-29 NOTE — Progress Notes (Signed)
Midodrine given for asymptomatic hypotension.

## 2022-07-29 NOTE — Social Work (Addendum)
CSW notified that pt is from [facility] with no barriers to return. TOC leadership provided an email from [employer] that pt is currently considered custodial and will not get approved for SNF until he is weight bearing. Per facility a medicaid application had not been filed, and pt would need to pay 30 days up front to come back. CSW notified medical team. TOC will continue to follow.   Per MD pt can weight bear as tolerated, requested a PT note to show this. TOC following.

## 2022-07-30 ENCOUNTER — Inpatient Hospital Stay (HOSPITAL_COMMUNITY): Payer: PPO

## 2022-07-30 DIAGNOSIS — I5023 Acute on chronic systolic (congestive) heart failure: Secondary | ICD-10-CM | POA: Diagnosis not present

## 2022-07-30 DIAGNOSIS — I509 Heart failure, unspecified: Secondary | ICD-10-CM | POA: Diagnosis not present

## 2022-07-30 MED ORDER — HYDROXYZINE HCL 25 MG PO TABS: 25.0000 mg | ORAL_TABLET | Freq: Three times a day (TID) | ORAL | 0 refills | Status: DC | PRN

## 2022-07-30 MED ORDER — HYDROCODONE-ACETAMINOPHEN 5-325 MG PO TABS: 1.0000 | ORAL_TABLET | Freq: Four times a day (QID) | ORAL | 0 refills | Status: AC | PRN

## 2022-07-30 MED ORDER — TRAZODONE HCL 50 MG PO TABS
25.0000 mg | ORAL_TABLET | Freq: Every day | ORAL | Status: DC
  Administered 2022-07-30 (×2): 25 mg via ORAL
  Filled 2022-07-30 (×2): qty 1

## 2022-07-30 NOTE — Progress Notes (Signed)
PROGRESS NOTE    Rodney Farley  ZDG:[id number] DOB: [birth year] DOA: 07/26/2022 PCP: [name], MD    Brief Narrative:  Rodney Farley is an 89 year old male with past medical history of anxiety and depression, chronic systolic heart failure, COPD, diabetes mellitus, peripheral neuropathy, hyperlipidemia, hypertension, sleep apnea, AICD who presented from skilled nursing facility on 1/14 with chest pain and shortness of breath.  He was found to be hypoxic. CTA showed no PE or pneumonia, but did reveal pleural effusions R > L and appeared volume overloaded. HD was performed with clinical improvement. Remains in poor clinical status due to frequent intolerance to hemodialysis and fluid removal.   Assessment & Plan:   Acute hypoxic respiratory failure due to pulmonary edema and pleural effusions, acute on chronic HFrEF: No infectious symptoms, RSV, flu, covid panel negative. - Continue volume management with HD per nephrology. Performed 1/15, extra 1/16, 1/17. - Repeat echo is relatively unchanged with stable EF 40-45%, LAD distribution hypokinesis. Cardiology discussed with patient, family, and plans on optimizing medical management for now.  - Adding back low dose coreg (hypotension limiting GDMT). Continue imdur 15mg , hydralazine 10mg  TID. - Symptomatically remains tenuous.  -Repeat chest x-ray today 1/18 shows pulmonary edema but no significant pleural effusion to drain.  CAD, ICM, s/p AICD, HLD: Troponin elevation currently suspected to be due to demand ischemia in setting of acute CHF and ESRD. History of ISR of ostial LAD stent s/p balloon angioplasty Oct 2023 with limited options. Echo with stable WMA. - Appreciate cardiology assessment and recommendations. Medical management. - Continue BB, long acting nitrate, ASA, brilinta, not starting heparin at this time.  - Continue statin, zetia, outpatient repatha.    ESRD, anemia of ESRD, secondary hyperparathyroidism:   - HD per nephrology, typically MWF schedule. - ESA 1/16, continue renvela per nephrology   History of left ankle fracture: Nov 2023.  -Discussed with orthopedics.  Weightbearing as tolerated with Cam boot.   Chronic hypotension:  - Continue intradialytic midodrine (MWF)   GERD:  - PPI   OSA:  - CPAP  Goal of care: -A detailed discussion with patient at the bedside and daughter-in-law on the phone.  Patient did not tolerate dialysis yesterday due to anxiety and shortness of breath.  Today he feels better.  Patient does have extensive cardiovascular problems and congestive heart failure that makes hemodialysis tolerance difficulty. -I explained to the patient that if he does not tolerate hemodialysis and stop hemodialysis, he will have only days or weeks to live.  Patient does understand it.  He is very well aware that hemodialysis is keeping him alive and wants to continue until he can do it.  Agreeable to use some pain medications and anxiety medication during hemodialysis. -Patient is medically stabilizing, high risk of readmission.  He is to work with PT OT and get some more rehab at skilled nursing facility.  Ultimate plan is to go back to his independent living with support system from daughter-in-law.  They are also planning to have hospice at home ultimately.  Transfer to skilled nursing facility when bed is available.  Next dialysis will be tomorrow.  DVT prophylaxis: heparin injection 5,000 Units Start: 07/27/22 1400   Code Status: DNR Family Communication: Daughter-in-law on the phone. Disposition Plan: Status is: Inpatient Remains inpatient appropriate because: Medically stable today to transfer to SNF level of care only.     Consultants:  Cardiology Nephrology  Procedures:  Hemodialysis  Antimicrobials:  None   Subjective:  Patient seen and examined he was tearful that he could not complete hemodialysis yesterday.  Today denies any complaints.  Patient tells  me that every time he goes to dialysis he gets this shallow breathing and feels like he is going to die.  See discussion above for goal of care.  Objective: Vitals:   07/29/22 2011 07/29/22 2339 07/30/22 0416 07/30/22 0729  BP: 124/73 (!) 103/45 (!) 90/57 108/65  Pulse: 79 82 70 86  Resp: 18 (!) 22 18 16   Temp: 97.8 F (36.6 C) 98.3 F (36.8 C) 98.3 F (36.8 C) 97.7 F (36.5 C)  TempSrc: Oral Oral Oral Oral  SpO2: 94% 96% 95% 92%  Weight:      Height:        Intake/Output Summary (Last 24 hours) at 07/30/2022 1052 Last data filed at 07/30/2022 0730 Gross per 24 hour  Intake 342 ml  Output 400 ml  Net -58 ml    Filed Weights   07/28/22 0740 07/28/22 1142 07/29/22 1342  Weight: 91 kg 88.9 kg 87.9 kg    Examination:  General exam: Appears calm and comfortable today.  Emotionally labile and anxious at times with conversation. Respiratory system: Bilateral good air entry.  Does have bilateral coarse crackles mostly on the posterior lung fields. Cardiovascular system: S1 & S2 heard, RRR.  Pacemaker in place. Gastrointestinal system: Abdomen is nondistended, soft and nontender. No organomegaly or masses felt. Normal bowel sounds heard. Central nervous system: Alert and oriented. No focal neurological deficits. Extremities: Symmetric 5 x 5 power. Multiple ecchymotic areas on the left leg. AV fistula right arm with thrill. Pacemaker left precordium.     Data Reviewed: I have personally reviewed following labs and imaging studies  CBC: Recent Labs  Lab 07/26/22 1505 07/26/22 1618 07/26/22 2211 07/27/22 0005 07/28/22 0019 07/29/22 0028 07/29/22 1353  WBC 14.2*  --   --  11.8* 9.3  --  8.3  NEUTROABS 11.0*  --   --   --   --   --   --   HGB 7.7*   < > 8.5* 7.7* 7.9* 8.4* 8.2*  HCT 24.1*   < > 25.0* 24.0* 24.8* 26.1* 25.0*  MCV 103.4*  --   --  101.3* 100.4*  --  100.8*  PLT 179  --   --  178 178  --  224   < > = values in this interval not displayed.    Basic  Metabolic Panel: Recent Labs  Lab 07/26/22 1505 07/26/22 1526 07/26/22 1618 07/26/22 2211 07/27/22 0005 07/28/22 0019 07/29/22 0028  NA 135  --  137 137 136 135 136  K 4.1  --  4.1 4.3 4.5 3.5 3.7  CL 93*  --   --   --  94* 94* 93*  CO2 30  --   --   --  28 30 28   GLUCOSE 163*  --   --   --  116* 138* 99  BUN 31*  --   --   --  34* 27* 20  CREATININE 5.96*  --   --   --  6.54*  6.51* 5.12* 4.01*  CALCIUM 8.9  --   --   --  8.7* 9.1 9.2  MG  --  2.1  --   --  2.1  --   --   PHOS  --   --   --   --  4.8* 3.8 3.2    GFR: Estimated Creatinine Clearance: 13.5  mL/min (A) (by C-G formula based on SCr of 4.01 mg/dL (H)). Liver Function Tests: Recent Labs  Lab 07/27/22 0005 07/28/22 0019 07/29/22 0028  ALBUMIN 3.0* 2.8* 2.9*    No results for input(s): "LIPASE", "AMYLASE" in the last 168 hours. No results for input(s): "AMMONIA" in the last 168 hours. Coagulation Profile: No results for input(s): "INR", "PROTIME" in the last 168 hours. Cardiac Enzymes: No results for input(s): "CKTOTAL", "CKMB", "CKMBINDEX", "TROPONINI" in the last 168 hours. BNP (last 3 results) No results for input(s): "PROBNP" in the last 8760 hours. HbA1C: No results for input(s): "HGBA1C" in the last 72 hours. CBG: No results for input(s): "GLUCAP" in the last 168 hours. Lipid Profile: No results for input(s): "CHOL", "HDL", "LDLCALC", "TRIG", "CHOLHDL", "LDLDIRECT" in the last 72 hours. Thyroid Function Tests: No results for input(s): "TSH", "T4TOTAL", "FREET4", "T3FREE", "THYROIDAB" in the last 72 hours. Anemia Panel: No results for input(s): "VITAMINB12", "FOLATE", "FERRITIN", "TIBC", "IRON", "RETICCTPCT" in the last 72 hours. Sepsis Labs: Recent Labs  Lab 07/26/22 1526 07/26/22 1934  LATICACIDVEN 2.8* 2.2*     Recent Results (from the past 240 hour(s))  Blood culture (routine x 2)     Status: None (Preliminary result)   Collection Time: 07/26/22  3:17 PM   Specimen: BLOOD LEFT FOREARM   Result Value Ref Range Status   Specimen Description BLOOD LEFT FOREARM  Final   Special Requests   Final    BOTTLES DRAWN AEROBIC AND ANAEROBIC Blood Culture adequate volume   Culture   Final    NO GROWTH 4 DAYS Performed at [facility], [place] [address] [place], [place] [address]    Report Status PENDING  Incomplete  Resp panel by RT-PCR (RSV, Flu A&B, Covid) Nasopharyngeal Swab     Status: None   Collection Time: 07/26/22  4:14 PM   Specimen: Nasopharyngeal Swab; Nasal Swab  Result Value Ref Range Status   SARS Coronavirus 2 by RT PCR NEGATIVE NEGATIVE Final    Comment: (NOTE) SARS-CoV-2 target nucleic acids are NOT DETECTED.  The SARS-CoV-2 RNA is generally detectable in upper respiratory specimens during the acute phase of infection. The lowest concentration of SARS-CoV-2 viral copies this assay can detect is 138 copies/mL. A negative result does not preclude SARS-Cov-2 infection and should not be used as the sole basis for treatment or other patient management decisions. A negative result may occur with  improper specimen collection/handling, submission of specimen other than nasopharyngeal swab, presence of viral mutation(s) within the areas targeted by this assay, and inadequate number of viral copies(<138 copies/mL). A negative result must be combined with clinical observations, patient history, and epidemiological information. The expected result is Negative.  Fact Sheet for Patients:  EntrepreneurPulse.com.au  Fact Sheet for Healthcare Providers:  IncredibleEmployment.be  This test is no t yet approved or cleared by the [place] FDA and  has been authorized for detection and/or diagnosis of SARS-CoV-2 by FDA under an Emergency Use Authorization (EUA). This EUA will remain  in effect (meaning this test can be used) for the duration of the COVID-19 declaration under Section 564(b)(1) of the Act, 21 U.S.C.section  360bbb-3(b)(1), unless the authorization is terminated  or revoked sooner.       Influenza A by PCR NEGATIVE NEGATIVE Final   Influenza B by PCR NEGATIVE NEGATIVE Final    Comment: (NOTE) The Xpert Xpress SARS-CoV-2/FLU/RSV plus assay is intended as an aid in the diagnosis of influenza from Nasopharyngeal swab specimens and should not be  used as a sole basis for treatment. Nasal washings and aspirates are unacceptable for Xpert Xpress SARS-CoV-2/FLU/RSV testing.  Fact Sheet for Patients: EntrepreneurPulse.com.au  Fact Sheet for Healthcare Providers: IncredibleEmployment.be  This test is not yet approved or cleared by the [place] FDA and has been authorized for detection and/or diagnosis of SARS-CoV-2 by FDA under an Emergency Use Authorization (EUA). This EUA will remain in effect (meaning this test can be used) for the duration of the COVID-19 declaration under Section 564(b)(1) of the Act, 21 U.S.C. section 360bbb-3(b)(1), unless the authorization is terminated or revoked.     Resp Syncytial Virus by PCR NEGATIVE NEGATIVE Final    Comment: (NOTE) Fact Sheet for Patients: EntrepreneurPulse.com.au  Fact Sheet for Healthcare Providers: IncredibleEmployment.be  This test is not yet approved or cleared by the [place] FDA and has been authorized for detection and/or diagnosis of SARS-CoV-2 by FDA under an Emergency Use Authorization (EUA). This EUA will remain in effect (meaning this test can be used) for the duration of the COVID-19 declaration under Section 564(b)(1) of the Act, 21 U.S.C. section 360bbb-3(b)(1), unless the authorization is terminated or revoked.  Performed at [facility], [place] [address], [place], [place] [address]   MRSA Next Gen by PCR, Nasal     Status: None   Collection Time: 07/27/22  6:26 PM   Specimen: Nasal Mucosa; Nasal Swab  Result Value Ref Range Status    MRSA by PCR Next Gen NOT DETECTED NOT DETECTED Final    Comment: (NOTE) The GeneXpert MRSA Assay (FDA approved for NASAL specimens only), is one component of a comprehensive MRSA colonization surveillance program. It is not intended to diagnose MRSA infection nor to guide or monitor treatment for MRSA infections. Test performance is not FDA approved in patients less than 80 years old. Performed at [facility], [place] [address] [place], [place] [address]          Radiology Studies: DG CHEST PORT 1 VIEW  Result Date: 07/30/2022 CLINICAL DATA:  Respiratory failure with hypoxia and hypercapnia. Shortness of breath, history of CHF. EXAM: PORTABLE CHEST 1 VIEW COMPARISON:  Chest radiograph 07/29/2022 at 0557 hours. FINDINGS: 0915 hours. Unchanged dual chamber AICD/pacemaker over the left chest with leads projecting over the right atrium, right ventricle and coronary sinus. Increased patchy reticular opacities throughout both lungs with pulmonary venous congestion and new small right and trace left pleural effusion. No pneumothorax. Stable cardiomegaly. Unchanged atherosclerotic calcifications of the aorta. Visualized portions of the bones and upper abdomen are unremarkable. IMPRESSION: 1. Worsening pulmonary edema with new small right and trace left pleural effusions. 2.  Aortic Atherosclerosis (ICD10-I70.0). Electronically Signed   By: Emmit Alexanders M.D.   On: 07/30/2022 09:23   DG CHEST PORT 1 VIEW  Result Date: 07/29/2022 CLINICAL DATA:  Shortness of breath EXAM: PORTABLE CHEST 1 VIEW COMPARISON:  Portable exam 9629 hours compared to 07/26/2022 FINDINGS: LEFT subclavian ICD leads project over RIGHT atrium, RIGHT ventricle, and coronary sinus. Enlargement of cardiac silhouette with pulmonary vascular congestion. Diffuse interstitial infiltrate consistent with pulmonary edema. No pleural effusion or pneumothorax. Atherosclerotic calcifications aortic arch. Bones demineralized.  IMPRESSION: Pulmonary edema/CHF. Aortic Atherosclerosis (ICD10-I70.0). Electronically Signed   By: Lavonia Dana M.D.   On: 07/29/2022 08:41        Scheduled Meds:  aspirin EC  81 mg Oral Daily   atorvastatin  80 mg Oral QHS   Chlorhexidine Gluconate Cloth  6 each Topical Q0600   cyanocobalamin  1,000 mcg Oral Daily   darbepoetin (ARANESP) injection - DIALYSIS  150 mcg Subcutaneous Q Mon-1800   docusate sodium  100 mg Oral BID   ezetimibe  10 mg Oral Daily   gabapentin  100 mg Oral QHS   heparin  5,000 Units Subcutaneous Q8H   melatonin  3 mg Oral QHS   midodrine  10 mg Oral Q M,W,F-HD   multivitamin  1 tablet Oral QHS   pantoprazole  40 mg Oral Daily   polyethylene glycol  17 g Oral Daily   polyvinyl alcohol  2 drop Both Eyes TID   saccharomyces boulardii  250 mg Oral BID   sevelamer carbonate  1,600 mg Oral TID WC   ticagrelor  90 mg Oral BID   traZODone  25 mg Oral QHS   Continuous Infusions:  promethazine (PHENERGAN) injection (IM or IVPB) Stopped (07/28/22 0807)     LOS: 4 days    Time spent: 35 minutes    Barb Merino, MD [facility] Pager [phone]

## 2022-07-30 NOTE — Progress Notes (Signed)
RT NOTE: Pt assessed for PRN Bipap order. No distress noted. Pt resting comfortably. Vitals stable. No BIPAP needed at this time.

## 2022-07-30 NOTE — Progress Notes (Signed)
[facility] Progress Note   Assessment/ Plan:   P HD: [facility] MWF  4h  400/1.5  94kg  2/2 bath  AVF  Hep 2500+ 1066midrun - venofer 100mg  tiw thru 1/24 - mircera 150 mcg q2, last 12/31, due 1/14 (today) - last HD 1/12, post wt 95.9kg   - dry wt recently raised 93 > 94kg   Assessment/ Plan: SOB/ resp distress - w/ bronchspasm per ED notes, also mild CHF by xray. +LE edema. HD with 3L off 07/27/22, Extra HD 1/16, short HD 07/29/22 to get back on schedule.  Next HD Friday ESRD - on HD MWF. Has not missed HD. HD 1/15, 1/16, 1/17.  Next HD Friday HTN/ volume - BP's wnl, vol excess as above.  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  MBD ckd - Ca in range, add on phos/ albumin.  CAD / sp AICD Elevated troponin: cardiology following, TTE with EF 45%, G2DD Dispo: pending  Subjective:    Seen in room.  Got only 400 mL off in HD yesterday d/t hypotension and anxiety- signed off 30 min early.  CXR done today- looks largely unchanged from yesterday/s CXR.    Objective:   BP 108/65 (BP Location: Left Arm)   Pulse 86   Temp 97.7 F (36.5 C) (Oral)   Resp 16   Ht 5\' 7"  (1.702 m)   Wt 87.9 kg   SpO2 92%   BMI 30.35 kg/m   Physical Exam: Gen:NAD, sitting on BSC CVS: RRR II VI SEM Resp: muffled bibasilar breath sounds [id number] Ext: trace LE edema- much better ACCESS: L AVF  Labs: BMET Recent Labs  Lab 07/26/22 1505 07/26/22 1618 07/26/22 2211 07/27/22 0005 07/28/22 0019 07/29/22 0028  NA 135 137 137 136 135 136  K 4.1 4.1 4.3 4.5 3.5 3.7  CL 93*  --   --  94* 94* 93*  CO2 30  --   --  28 30 28   GLUCOSE 163*  --   --  116* 138* 99  BUN 31*  --   --  34* 27* 20  CREATININE 5.96*  --   --  6.54*  6.51* 5.12* 4.01*  CALCIUM 8.9  --   --  8.7* 9.1 9.2  PHOS  --   --   --  4.8* 3.8 3.2   CBC Recent Labs  Lab 07/26/22 1505 07/26/22 1618 07/27/22 0005 07/28/22 0019 07/29/22 0028 07/29/22 1353  WBC 14.2*  --  11.8* 9.3  --  8.3   NEUTROABS 11.0*  --   --   --   --   --   HGB 7.7*   < > 7.7* 7.9* 8.4* 8.2*  HCT 24.1*   < > 24.0* 24.8* 26.1* 25.0*  MCV 103.4*  --  101.3* 100.4*  --  100.8*  PLT 179  --  178 178  --  224   < > = values in this interval not displayed.      Medications:     aspirin EC  81 mg Oral Daily   atorvastatin  80 mg Oral QHS   Chlorhexidine Gluconate Cloth  6 each Topical Q0600   cyanocobalamin  1,000 mcg Oral Daily   darbepoetin (ARANESP) injection - DIALYSIS  150 mcg Subcutaneous Q Mon-1800   docusate sodium  100 mg Oral BID   ezetimibe  10 mg Oral Daily   gabapentin  100 mg Oral QHS   heparin  5,000 Units Subcutaneous Q8H   melatonin  3 mg Oral QHS   midodrine  10 mg Oral Q M,W,F-HD   multivitamin  1 tablet Oral QHS   pantoprazole  40 mg Oral Daily   polyethylene glycol  17 g Oral Daily   polyvinyl alcohol  2 drop Both Eyes TID   saccharomyces boulardii  250 mg Oral BID   sevelamer carbonate  1,600 mg Oral TID WC   ticagrelor  90 mg Oral BID   traZODone  25 mg Oral QHS     Madelon Lips MD 07/30/2022, 9:23 AM

## 2022-07-30 NOTE — Progress Notes (Signed)
Physical Therapy Treatment Patient Details Name: Rodney Farley MRN: [id number] DOB: [birth year] Today's Date: 07/30/2022   History of Present Illness Pt is a 89 y.o. M who presents from [facility] 07/26/2022 with chest pain and SOB. Pt found to have hypoxic. CTA revealed pleural effusions R > L. Significant PMH: anxiety/depression, chronic systolic heart failure, COPD, diabetes mellitus, peripheral neuropathy, AICD, recent left trimalleolar ankle fx s/p intramedullary implant.    PT Comments    Pt is WBAT in L CAM boot. Pt was able to progress to ambulating up to ~90 ft before fatiguing and needing to sit due to being lightheaded today (VSS on RA). He continues to require cues to improve his stance width and posture when ambulating and up to minA to prevent LOB. He is at high risk for falls due to his deficits in balance, activity tolerance, and strength. Focused remainder of session on lower extremity strengthening. Will continue to follow acutely. Current recommendations remain appropriate.     Recommendations for follow up therapy are one component of a multi-disciplinary discharge planning process, led by the attending physician.  Recommendations may be updated based on patient status, additional functional criteria and insurance authorization.  Follow Up Recommendations  Skilled nursing-short term rehab (<3 hours/day) (at [facility]) Can patient physically be transported by private vehicle: Yes   Assistance Recommended at Discharge Intermittent Supervision/Assistance  Patient can return home with the following A little help with walking and/or transfers;A little help with bathing/dressing/bathroom;Assistance with cooking/housework;Assist for transportation;Help with stairs or ramp for entrance   Equipment Recommendations  None recommended by PT    Recommendations for Other Services       Precautions / Restrictions Precautions Precautions: Fall Required Braces or Orthoses: Other  Brace Other Brace: L CAM boot Restrictions Weight Bearing Restrictions: No Other Position/Activity Restrictions: Per pt, cleared for WBAT in CAM boot 1/13     Mobility  Bed Mobility Overal bed mobility: Needs Assistance Bed Mobility: Sit to Supine       Sit to supine: Supervision   General bed mobility comments: No physical assist. Tachypnea upon initially returning to supine.    Transfers Overall transfer level: Needs assistance Equipment used: Rolling walker (2 wheels), 1 person hand held assist Transfers: Sit to/from Stand, Bed to chair/wheelchair/BSC Sit to Stand: Min guard, Min assist   Step pivot transfers: Min assist       General transfer comment: Pt coming to stand from EOB 2x and from recliner 5x with min guard-minA to power up to stand and gain stability. MinA to steady pt with HHA to stand step bed <> recliner 1x each way.    Ambulation/Gait Ambulation/Gait assistance: Min assist Gait Distance (Feet): 90 Feet (x4 bouts of ~90 ft > ~15 ft > ~2 ft > ~2 ft) Assistive device: Rolling walker (2 wheels) Gait Pattern/deviations: Step-through pattern, Decreased stride length, Narrow base of support, Trunk flexed Gait velocity: decreased Gait velocity interpretation: <1.8 ft/sec, indicate of risk for recurrent falls   General Gait Details: Pt provided verbal cues to widen BOS and improve upright posture, good sucess noted. Pt demonstrated increased trunk sway and instability as distance progressed, ultimately needing a chair to sit to rest with pt reporting feeling lightheaded (VSS on RA when pleth was reliable).   Stairs             Wheelchair Mobility    Modified Rankin (Stroke Patients Only)       Balance Overall balance assessment: Needs assistance Sitting-balance support: Feet supported  Sitting balance-Leahy Scale: Fair     Standing balance support: Bilateral upper extremity supported, No upper extremity supported, During functional  activity Standing balance-Leahy Scale: Fair Standing balance comment: Able to stand statically without UE support but reliant on RW to ambulate                            Cognition Arousal/Alertness: Awake/alert Behavior During Therapy: WFL for tasks assessed/performed Overall Cognitive Status: Within Functional Limits for tasks assessed                                          Exercises General Exercises - Lower Extremity Long Arc Quad: AROM, Strengthening, Both, 10 reps, Seated Hip ABduction/ADduction: AROM, Strengthening, Both, 10 reps, Seated Hip Flexion/Marching: AROM, Strengthening, Both, 10 reps, Seated Other Exercises Other Exercises: sit <> stand 5x from recliner    General Comments General comments (skin integrity, edema, etc.): VSS on RA when pleth was reliable      Pertinent Vitals/Pain Pain Assessment Pain Assessment: Faces Faces Pain Scale: Hurts little more Pain Location: central lower chest Pain Descriptors / Indicators: Discomfort, Grimacing Pain Intervention(s): Limited activity within patient's tolerance, Monitored during session, Other (comment) (notified RN)    Home Living                          Prior Function            PT Goals (current goals can now be found in the care plan section) Acute Rehab PT Goals Patient Stated Goal: to go back to [facility] until February and then transition to apartment PT Goal Formulation: With patient Time For Goal Achievement: 08/11/22 Potential to Achieve Goals: Good Progress towards PT goals: Progressing toward goals    Frequency    Min 2X/week      PT Plan Current plan remains appropriate    Co-evaluation              AM-PAC PT "6 Clicks" Mobility   Outcome Measure  Help needed turning from your back to your side while in a flat bed without using bedrails?: None Help needed moving from lying on your back to sitting on the side of a flat bed without  using bedrails?: A Little Help needed moving to and from a bed to a chair (including a wheelchair)?: A Little Help needed standing up from a chair using your arms (e.g., wheelchair or bedside chair)?: A Little Help needed to walk in hospital room?: A Little Help needed climbing 3-5 steps with a railing? : A Lot 6 Click Score: 18    End of Session Equipment Utilized During Treatment: Other (comment);Gait belt (L CAM boot) Activity Tolerance: Patient tolerated treatment well Patient left: in bed;with call bell/phone within reach;with bed alarm set Nurse Communication: Mobility status;Other (comment) (central chest pain pt thought was angina then stated it felt more like indigestion) PT Visit Diagnosis: Difficulty in walking, not elsewhere classified (R26.2);Unsteadiness on feet (R26.81);Other abnormalities of gait and mobility (R26.89);Muscle weakness (generalized) (M62.81)     Time: 9622-2979 PT Time Calculation (min) (ACUTE ONLY): 25 min  Charges:  $Gait Training: 8-22 mins $Therapeutic Exercise: 8-22 mins                     Moishe Spice, PT, DPT Acute Rehabilitation Services  Office: [place] 07/30/2022, 1:47 PM

## 2022-07-30 NOTE — Progress Notes (Signed)
Patient in no respiratory distress. BiPAP not needed.

## 2022-07-30 NOTE — TOC Progression Note (Addendum)
Transition of Care [facility]) - Progression Note    Patient Details  Name: Rodney Farley MRN: [id number] Date of Birth: 1932-05-30  Transition of Care [facility]) CM/SW [place], [place] Phone Number: 07/30/2022, 9:36 AM  Clinical Narrative:     CSW notified pt is medically ready to DC. TOC leadership currently working with HTA MD to attempt to get an authorization for pt to return to [facility]. No updates as of yet. CSW updated DIL to barriers and urged her to consider plans if SNF is not an option. CSW discussed possible services that can be arranged at the ILF level. TOC continues to follow.      CSW spoke with HTA Rep who agreed to review the case with these new updates. Auth started for SNF and PTAR. HTA will follow up with CM when decision is made. [facility] and dtr updated and following.   Expected Discharge Plan and Services         Expected Discharge Date: 07/30/22                                     Social Determinants of Health (SDOH) Interventions [place]: No Food Insecurity (07/29/2022)  Housing: Low Risk  (07/29/2022)  Transportation Needs: No Transportation Needs (07/29/2022)  Utilities: Not At Risk (07/29/2022)  Depression (PHQ2-9): Low Risk  (05/19/2022)  Tobacco Use: Medium Risk (07/27/2022)    Readmission Risk Interventions    05/08/2022    4:03 PM 04/02/2022    3:35 PM  Readmission Risk Prevention Plan  Transportation Screening Complete Complete  Medication Review Press photographer) Complete Complete  PCP or Specialist appointment within 3-5 days of discharge  Complete  HRI or [place] Complete Complete  SW Recovery Care/Counseling Consult Complete Complete  Palliative Care Screening Not Applicable Not [place] Not Applicable Not Applicable

## 2022-07-30 NOTE — Progress Notes (Signed)
Mobility Specialist Progress Note    07/30/22 0957  Mobility  Activity Transferred to/from [facility]  Level of Assistance Minimal assist, patient does 75% or more  Assistive Device Front wheel walker  Distance Ambulated (ft) 2 ft  Activity Response Tolerated well  Mobility Referral Yes  $Mobility charge 1 Mobility   Pt received and requesting to attempt BM. No complaints. Left with call bell in reach and NT aware.   Hildred Alamin Mobility Specialist  Please Psychologist, sport and exercise or Rehab Office at [phone]

## 2022-07-30 NOTE — Progress Notes (Signed)
Mobility Specialist Progress Note    07/30/22 1318  Mobility  Activity Ambulated with assistance in hallway  Level of Assistance Contact guard assist, steadying assist  Assistive Device Front wheel walker  Distance Ambulated (ft) 220 ft  Activity Response Tolerated well  Mobility Referral Yes  $Mobility charge 1 Mobility   Pre-Mobility: 80 HR, 96% SpO2 During Mobility: 93 HR Post-Mobility: 91 HR, 98% SpO2  Pt received in chair and agreeable. No complaints on walk. Returned to chair with call bell in reach.    Hildred Alamin Mobility Specialist  Please Psychologist, sport and exercise or Rehab Office at [phone]

## 2022-07-30 NOTE — Progress Notes (Signed)
No Respiratory Distress noted. No BIPAP needed at this time.

## 2022-07-31 DIAGNOSIS — I5023 Acute on chronic systolic (congestive) heart failure: Secondary | ICD-10-CM | POA: Diagnosis not present

## 2022-07-31 LAB — CBC
HCT: 27.7 % — ABNORMAL LOW (ref 39.0–52.0)
Hemoglobin: 8.6 g/dL — ABNORMAL LOW (ref 13.0–17.0)
MCH: 31.6 pg (ref 26.0–34.0)
MCHC: 31 g/dL (ref 30.0–36.0)
MCV: 101.8 fL — ABNORMAL HIGH (ref 80.0–100.0)
Platelets: 248 10*3/uL (ref 150–400)
RBC: 2.72 MIL/uL — ABNORMAL LOW (ref 4.22–5.81)
RDW: 17 % — ABNORMAL HIGH (ref 11.5–15.5)
WBC: 7.7 10*3/uL (ref 4.0–10.5)
nRBC: 0 % (ref 0.0–0.2)

## 2022-07-31 LAB — RENAL FUNCTION PANEL
Albumin: 3.3 g/dL — ABNORMAL LOW (ref 3.5–5.0)
Anion gap: 13 (ref 5–15)
BUN: 29 mg/dL — ABNORMAL HIGH (ref 8–23)
CO2: 27 mmol/L (ref 22–32)
Calcium: 9.3 mg/dL (ref 8.9–10.3)
Chloride: 95 mmol/L — ABNORMAL LOW (ref 98–111)
Creatinine, Ser: 6.11 mg/dL — ABNORMAL HIGH (ref 0.61–1.24)
GFR, Estimated: 8 mL/min — ABNORMAL LOW (ref >60)
Glucose, Bld: 153 mg/dL — ABNORMAL HIGH (ref 70–99)
Phosphorus: 3.3 mg/dL (ref 2.5–4.6)
Potassium: 3.5 mmol/L (ref 3.5–5.1)
Sodium: 135 mmol/L (ref 135–145)

## 2022-07-31 LAB — CULTURE, BLOOD (ROUTINE X 2)
Culture: NO GROWTH
Special Requests: ADEQUATE

## 2022-07-31 MED ORDER — CALCIUM CARBONATE ANTACID 500 MG PO CHEW
1.0000 | CHEWABLE_TABLET | Freq: Three times a day (TID) | ORAL | Status: DC
  Administered 2022-07-31 (×2): 200 mg via ORAL
  Filled 2022-07-31 (×2): qty 1

## 2022-07-31 MED ORDER — LORAZEPAM 0.5 MG PO TABS: 0.5000 mg | ORAL_TABLET | Freq: Three times a day (TID) | ORAL | 0 refills | Status: AC | PRN

## 2022-07-31 MED ORDER — HEPARIN SODIUM (PORCINE) 1000 UNIT/ML IJ SOLN
INTRAMUSCULAR | Status: AC
  Administered 2022-07-31: 2500 [IU]
  Filled 2022-07-31: qty 3

## 2022-07-31 MED ORDER — LORAZEPAM 0.5 MG PO TABS: 0.5000 mg | ORAL_TABLET | Freq: Three times a day (TID) | ORAL | 0 refills | Status: DC | PRN

## 2022-07-31 MED ORDER — ALBUMIN HUMAN 25 % IV SOLN
25.0000 g | Freq: Once | INTRAVENOUS | Status: AC
  Administered 2022-07-31: 25 g via INTRAVENOUS

## 2022-07-31 MED ORDER — LORAZEPAM 0.5 MG PO TABS
0.5000 mg | ORAL_TABLET | Freq: Four times a day (QID) | ORAL | Status: DC | PRN
  Administered 2022-07-31: 0.5 mg via ORAL
  Filled 2022-07-31: qty 1

## 2022-07-31 NOTE — Progress Notes (Signed)
D/C order noted. Contacted [place] to advise clinic of pt's d/c today and that pt should resume care on Monday.   Melven Sartorius Renal Navigator [phone]

## 2022-07-31 NOTE — Discharge Summary (Signed)
Physician Discharge Summary  Rodney Farley JSE:[id number] DOB: 1932-04-06 DOA: 07/26/2022  PCP: [name], MD  Admit date: 07/26/2022 Discharge date: 07/31/2022  Admitted From: Skilled nursing facility Disposition: Skilled nursing facility  Recommendations for Outpatient Follow-up:  Follow up with PCP in 1-2 weeks after discharge. Consult palliative care team at the skilled nursing facility.  Discharge Condition: Fair CODE STATUS: DNR Diet recommendation: Low-salt diet, fluid restriction less than 1500 mL in 24 hours.  Discharge summary: Rodney Farley is an 89 year old male with past medical history of anxiety and depression, chronic systolic heart failure, COPD, diabetes mellitus, peripheral neuropathy, hyperlipidemia, hypertension, sleep apnea, AICD who presented from skilled nursing facility on 1/14 with chest pain and shortness of breath.  He was found to be hypoxic. CTA showed no PE or pneumonia, but did reveal pleural effusions R > L and appeared volume overloaded. HD was performed with clinical improvement. Remains in poor clinical status due to frequent intolerance to hemodialysis and fluid removal.     Assessment & Plan of care:    Acute hypoxic respiratory failure due to pulmonary edema and pleural effusions, acute on chronic HFrEF: No infectious symptoms, RSV, flu, covid panel negative. - Continue volume management with HD per nephrology. Performed 1/15, extra 1/16, 1/17. Getting HD today on his schedule. - Repeat echo is relatively unchanged with stable EF 40-45%, LAD distribution hypokinesis. Cardiology discussed with patient, family, and plans on optimizing medical management for now.  - Adding back low dose coreg (hypotension limiting GDMT). Continue imdur 15mg , hydralazine 10mg  TID. - Symptomatically remains tenuous.  -Repeat chest x-ray shows pulmonary edema but no significant pleural effusion to drain. -Will be prescribed a small dose of Ativan as  needed for anxiety.   CAD, ICM, s/p AICD, HLD: Troponin elevation currently suspected to be due to demand ischemia in setting of acute CHF and ESRD. History of ISR of ostial LAD stent s/p balloon angioplasty Oct 2023 with limited options. Echo with stable WMA. - Appreciate cardiology assessment and recommendations. Medical management. - Continue BB, long acting nitrate, ASA, brilinta, not starting heparin at this time.  - Continue statin, zetia, outpatient repatha.    ESRD, anemia of ESRD, secondary hyperparathyroidism:  - HD per nephrology, typically MWF schedule.   History of left ankle fracture: Nov 2023.  -Discussed with orthopedics.  Weightbearing as tolerated with Cam boot.   Chronic hypotension:  - Continue intradialytic midodrine (MWF)   GERD:  - PPI   OSA:  - CPAP at night.    Transfer to skilled nursing facility when bed is available.  Patient with gradual worsening debility and disability, interested in palliative care follow-up.  Please consult palliative care at a skilled nursing facility.  He may even benefit with hospice at home when transitioning from a skilled nursing rehab.  Stable for discharge today.  Discharge Diagnoses:  Principal Problem:   Acute on chronic systolic CHF (congestive heart failure) (HCC) Active Problems:   ESRD on hemodialysis (HCC)   Secondary hyperparathyroidism of renal origin ([place])   Ischemic cardiomyopathy   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   Depression   Anemia in chronic kidney disease   Atrioventricular block, complete (HCC)   Pacemaker   Type II diabetes mellitus ([place])   Hyperlipidemia   Sleep apnea   Thrombocytopenia (HCC)   Major depression, recurrent, full remission ([place])   Essential hypertension   GERD (gastroesophageal reflux disease)   DOE (dyspnea on exertion), due to significant CAD  Gout   Pleural effusion due to CHF (congestive heart failure) (HCC)   BPH associated with nocturia    Diabetic peripheral neuropathy (HCC)   COPD (chronic obstructive pulmonary disease) (HCC)   Atrial fibrillation (HCC) - SCAF   Fluid overload   Acute respiratory failure with hypoxia (HCC)   Acute on chronic systolic heart failure [facility])    Discharge Instructions  Discharge Instructions     Diet - low sodium heart healthy   Complete by: As directed    Discharge instructions   Complete by: As directed    Consult palliative care team at SNF   Increase activity slowly   Complete by: As directed       Allergies as of 07/31/2022       Reactions   Bee Venom Anaphylaxis   Lyrica [pregabalin] Other (See Comments)   hallucinations   Prednisone Other (See Comments)   hallucinations   Zocor [simvastatin] Nausea Only, Other (See Comments)   Headache with brand name only.  Can take the generic.        Medication List     STOP taking these medications    ipratropium-albuterol 0.5-2.5 (3) MG/3ML Soln Commonly known as: DUONEB   isosorbide mononitrate 30 MG 24 hr tablet Commonly known as: IMDUR   LUBRICATING EYE DROPS OP   UNABLE TO FIND       TAKE these medications    acetaminophen 500 MG tablet Commonly known as: TYLENOL Take 500 mg by mouth as needed for moderate pain.   ascorbic acid 500 MG tablet Commonly known as: VITAMIN C Take 500 mg by mouth daily.   aspirin EC 81 MG tablet Take 81 mg by mouth daily.   atorvastatin 80 MG tablet Commonly known as: LIPITOR Take 1 tablet by mouth daily.   bisacodyl 10 MG suppository Commonly known as: DULCOLAX Place 10 mg rectally as needed for moderate constipation.   Brilinta 90 MG Tabs tablet Generic drug: ticagrelor Take 1 tablet (90 mg total) by mouth 2 (two) times daily.   calcitRIOL 0.25 MCG capsule Commonly known as: ROCALTROL Take 7 capsules (1.75 mcg total) by mouth every Monday, Wednesday, and Friday with hemodialysis.   carbamide peroxide 6.5 % OTIC solution Commonly known as: DEBROX Place 5 drops  into both ears 2 (two) times daily as needed (earwax). What changed: when to take this   carvedilol 3.125 MG tablet Commonly known as: COREG Take 1 tablet (3.125 mg total) by mouth 2 (two) times daily with a meal. Hold for SBP <100 mmhg OR hr < 60/min What changed: additional instructions   cyanocobalamin 1000 MCG tablet Commonly known as: VITAMIN B12 Take 1,000 mcg by mouth daily.   Dermacloud Oint Apply 1 Application topically in the morning, at noon, and at bedtime. Apply to sacrum for redness after incontinent episodes   docusate sodium 100 MG capsule Commonly known as: COLACE Take 1 capsule (100 mg total) by mouth 2 (two) times daily.   ezetimibe 10 MG tablet Commonly known as: ZETIA Take 1 tablet (10 mg total) by mouth daily.   feeding supplement (PRO-STAT SUGAR FREE 64) Liqd Take 30 mLs by mouth daily.   FREESTYLE TEST STRIPS test strip Generic drug: glucose blood Use to check blood sugar daily   gabapentin 100 MG capsule Commonly known as: NEURONTIN Take 1 capsule (100 mg total) by mouth at bedtime.   glucose monitoring kit monitoring kit USE TO MONITOR BLOOD GLUCOSE AS DIRECTED   HYDROcodone-acetaminophen 5-325 MG tablet Commonly  known as: NORCO/VICODIN Take 1 tablet by mouth every 6 (six) hours as needed for up to 3 days for severe pain (postoperative pain).   LORazepam 0.5 MG tablet Commonly known as: ATIVAN Take 1 tablet (0.5 mg total) by mouth every 8 (eight) hours as needed for up to 5 days for anxiety.   LUBRICANT EYE OP Place 2 drops into both eyes in the morning, at noon, and at bedtime.   melatonin 3 MG Tabs tablet Take 1 tablet (3 mg total) by mouth at bedtime.   midodrine 5 MG tablet Commonly known as: PROAMATINE Take 5 mg by mouth as needed (for low BP after dialysis days only (MWF)). What changed: Another medication with the same name was changed. Make sure you understand how and when to take each.   midodrine 10 MG tablet Commonly known  as: PROAMATINE Take 0.5-1 tablets (5-10 mg total) by mouth See admin instructions. Take 1 tablet (10 mg) three times a week. Add 5 mg a night after dialysis as needed for low blood pressure. What changed:  how much to take when to take this additional instructions   multivitamin with minerals tablet Take 1 tablet by mouth daily.   Nephro Vitamins 0.8 MG Tabs Take 1 tablet by mouth daily. What changed: Another medication with the same name was removed. Continue taking this medication, and follow the directions you see here.   nitroGLYCERIN 0.4 MG SL tablet Commonly known as: NITROSTAT Place 1 tablet under the tongue every 5 minutes x 3 doses as needed for chest pain.   oxymetazoline 0.05 % nasal spray Commonly known as: AFRIN Place 1 spray into both nostrils 2 (two) times daily as needed for congestion.   pantoprazole 40 MG tablet Commonly known as: PROTONIX Take 1 tablet by mouth daily.   polyethylene glycol 17 g packet Commonly known as: MIRALAX / GLYCOLAX Take 17.5 g by mouth daily. Hold for loose stools   Repatha SureClick 979 MG/ML [place] Generic drug: Evolocumab Inject 140 mg into the skin every 14 (fourteen) days.   saccharomyces boulardii 250 MG capsule Commonly known as: FLORASTOR Take 1 capsule (250 mg total) by mouth 2 (two) times daily.   sevelamer carbonate 800 MG tablet Commonly known as: RENVELA Take 2 tablets (1,600 mg total) by mouth 3 (three) times daily with meals.   sorbitol 70 % solution Take 15 mLs by mouth every 8 (eight) hours as needed (constipation).   traZODone 50 MG tablet Commonly known as: DESYREL Take 25 mg by mouth at bedtime as needed for sleep.        Allergies  Allergen Reactions   Bee Venom Anaphylaxis   Lyrica [Pregabalin] Other (See Comments)    hallucinations   Prednisone Other (See Comments)    hallucinations   Zocor [Simvastatin] Nausea Only and Other (See Comments)    Headache with brand name only.  Can take the  generic.    Consultations: Cardiology Nephrology   Procedures/Studies: DG CHEST PORT 1 VIEW  Result Date: 07/30/2022 CLINICAL DATA:  Respiratory failure with hypoxia and hypercapnia. Shortness of breath, history of CHF. EXAM: PORTABLE CHEST 1 VIEW COMPARISON:  Chest radiograph 07/29/2022 at 0557 hours. FINDINGS: 0915 hours. Unchanged dual chamber AICD/pacemaker over the left chest with leads projecting over the right atrium, right ventricle and coronary sinus. Increased patchy reticular opacities throughout both lungs with pulmonary venous congestion and new small right and trace left pleural effusion. No pneumothorax. Stable cardiomegaly. Unchanged atherosclerotic calcifications of the aorta. Visualized portions  of the bones and upper abdomen are unremarkable. IMPRESSION: 1. Worsening pulmonary edema with new small right and trace left pleural effusions. 2.  Aortic Atherosclerosis (ICD10-I70.0). Electronically Signed   By: Emmit Alexanders M.D.   On: 07/30/2022 09:23   DG CHEST PORT 1 VIEW  Result Date: 07/29/2022 CLINICAL DATA:  Shortness of breath EXAM: PORTABLE CHEST 1 VIEW COMPARISON:  Portable exam 5102 hours compared to 07/26/2022 FINDINGS: LEFT subclavian ICD leads project over RIGHT atrium, RIGHT ventricle, and coronary sinus. Enlargement of cardiac silhouette with pulmonary vascular congestion. Diffuse interstitial infiltrate consistent with pulmonary edema. No pleural effusion or pneumothorax. Atherosclerotic calcifications aortic arch. Bones demineralized. IMPRESSION: Pulmonary edema/CHF. Aortic Atherosclerosis (ICD10-I70.0). Electronically Signed   By: Lavonia Dana M.D.   On: 07/29/2022 08:41   ECHOCARDIOGRAM COMPLETE  Result Date: 07/27/2022    ECHOCARDIOGRAM REPORT   Patient Name:   TRENTEN WATCHMAN Date of Exam: 07/27/2022 Medical Rec #:  [id number]             Height:       67.0 in Accession #:    [id number]            Weight:       200.0 lb Date of Birth:  Mar 08, 1932              BSA:          2.022 m Patient Age:    10 years              BP:           95/46 mmHg Patient Gender: M                     HR:           90 bpm. Exam Location:  Inpatient Procedure: 2D Echo, Color Doppler, Cardiac Doppler and Intracardiac            Opacification Agent Indications:    V40.08 Acute systolic (congestive) heart failure  History:        Patient has prior history of Echocardiogram examinations, most                 recent 05/07/2022. CHF, Pacemaker and Defibrillator, COPD and                 ESRD, Arrythmias:Atrial Fibrillation and AV Block; Risk                 Factors:Hypertension, Diabetes, Dyslipidemia and Sleep Apnea.  Sonographer:    Raquel Sarna Senior RDCS Referring Phys: [id number] [place]  1. Septal apical akinesis distal anterior wall and inferior apical hypokinesis . Left ventricular ejection fraction, by estimation, is 40 to 45%. The left ventricle has mildly decreased function. The left ventricle has no regional wall motion abnormalities. The left ventricular internal cavity size was moderately dilated. Left ventricular diastolic parameters are consistent with Grade II diastolic dysfunction (pseudonormalization). Elevated left ventricular end-diastolic pressure.  2. Cathers in RA/RV . Right ventricular systolic function is normal. The right ventricular size is normal. There is moderately elevated pulmonary artery systolic pressure.  3. Left atrial size was severely dilated.  4. The mitral valve is abnormal. Mild mitral valve regurgitation. No evidence of mitral stenosis. Severe mitral annular calcification.  5. The aortic valve is tricuspid. There is moderate calcification of the aortic valve. There is moderate thickening of the aortic valve. Aortic valve regurgitation is not visualized. Moderate aortic valve stenosis.  6.  The inferior vena cava is dilated in size with <50% respiratory variability, suggesting right atrial pressure of 15 mmHg. FINDINGS  Left Ventricle: Septal  apical akinesis distal anterior wall and inferior apical hypokinesis. Left ventricular ejection fraction, by estimation, is 40 to 45%. The left ventricle has mildly decreased function. The left ventricle has no regional wall motion abnormalities. Definity contrast agent was given IV to delineate the left ventricular endocardial borders. The left ventricular internal cavity size was moderately dilated. There is no left ventricular hypertrophy. Left ventricular diastolic parameters are consistent with Grade II diastolic dysfunction (pseudonormalization). Elevated left ventricular end-diastolic pressure. Right Ventricle: Cathers in RA/RV. The right ventricular size is normal. No increase in right ventricular wall thickness. Right ventricular systolic function is normal. There is moderately elevated pulmonary artery systolic pressure. The tricuspid regurgitant velocity is 2.80 m/s, and with an assumed right atrial pressure of 15 mmHg, the estimated right ventricular systolic pressure is 92.1 mmHg. Left Atrium: Left atrial size was severely dilated. Right Atrium: Right atrial size was normal in size. Pericardium: There is no evidence of pericardial effusion. Mitral Valve: The mitral valve is abnormal. There is moderate thickening of the mitral valve leaflet(s). There is moderate calcification of the mitral valve leaflet(s). Severe mitral annular calcification. Mild mitral valve regurgitation. No evidence of mitral valve stenosis. MV peak gradient, 7.6 mmHg. The mean mitral valve gradient is 3.0 mmHg. Tricuspid Valve: The tricuspid valve is normal in structure. Tricuspid valve regurgitation is mild . No evidence of tricuspid stenosis. Aortic Valve: The aortic valve is tricuspid. There is moderate calcification of the aortic valve. There is moderate thickening of the aortic valve. Aortic valve regurgitation is not visualized. Moderate aortic stenosis is present. Aortic valve mean gradient measures 20.0 mmHg. Aortic valve  peak gradient measures 31.8 mmHg. Aortic valve area, by VTI measures 1.20 cm. Pulmonic Valve: The pulmonic valve was normal in structure. Pulmonic valve regurgitation is mild. No evidence of pulmonic stenosis. Aorta: The aortic root is normal in size and structure. Venous: The inferior vena cava is dilated in size with less than 50% respiratory variability, suggesting right atrial pressure of 15 mmHg. IAS/Shunts: No atrial level shunt detected by color flow Doppler.  LEFT VENTRICLE PLAX 2D LVIDd:         5.70 cm      Diastology LVIDs:         4.40 cm      LV e' medial:    6.37 cm/s LV PW:         0.90 cm      LV E/e' medial:  20.3 LV IVS:        0.70 cm      LV e' lateral:   5.44 cm/s LVOT diam:     2.10 cm      LV E/e' lateral: 23.7 LV SV:         71 LV SV Index:   35 LVOT Area:     3.46 cm  LV Volumes (MOD) LV vol d, MOD A2C: 130.0 ml LV vol d, MOD A4C: 152.0 ml LV vol s, MOD A2C: 78.2 ml LV vol s, MOD A4C: 103.0 ml LV SV MOD A2C:     51.8 ml LV SV MOD A4C:     152.0 ml LV SV MOD BP:      51.9 ml RIGHT VENTRICLE RV S prime:     13.10 cm/s TAPSE (M-mode): 1.8 cm LEFT ATRIUM  Index        RIGHT ATRIUM           Index LA diam:        5.30 cm  2.62 cm/m   RA Area:     18.30 cm LA Vol (A2C):   83.3 ml  41.19 ml/m  RA Volume:   53.00 ml  26.21 ml/m LA Vol (A4C):   107.0 ml 52.91 ml/m LA Biplane Vol: 98.1 ml  48.51 ml/m  AORTIC VALVE AV Area (Vmax):    1.29 cm AV Area (Vmean):   1.29 cm AV Area (VTI):     1.20 cm AV Vmax:           282.00 cm/s AV Vmean:          214.000 cm/s AV VTI:            0.597 m AV Peak Grad:      31.8 mmHg AV Mean Grad:      20.0 mmHg LVOT Vmax:         105.00 cm/s LVOT Vmean:        79.700 cm/s LVOT VTI:          0.206 m LVOT/AV VTI ratio: 0.35  AORTA Ao Root diam: 2.90 cm Ao Asc diam:  2.90 cm MITRAL VALVE                TRICUSPID VALVE MV Area (PHT): 4.83 cm     TR Peak grad:   31.4 mmHg MV Area VTI:   2.64 cm     TR Vmax:        280.00 cm/s MV Peak grad:  7.6 mmHg MV  Mean grad:  3.0 mmHg     SHUNTS MV Vmax:       1.38 m/s     Systemic VTI:  0.21 m MV Vmean:      84.8 cm/s    Systemic Diam: 2.10 cm MV Decel Time: 157 msec MV E velocity: 129.00 cm/s MV A velocity: 92.40 cm/s MV E/A ratio:  1.40 Jenkins Rouge MD Electronically signed by Jenkins Rouge MD Signature Date/Time: 07/27/2022/2:08:07 PM    Final    CT Angio Chest PE W and/or Wo Contrast  Result Date: 07/26/2022 CLINICAL DATA:  Shortness of breath since this morning. Pulmonary embolism suspected, high probability. EXAM: CT ANGIOGRAPHY CHEST WITH CONTRAST TECHNIQUE: Multidetector CT imaging of the chest was performed using the standard protocol during bolus administration of intravenous contrast. Multiplanar CT image reconstructions and MIPs were obtained to evaluate the vascular anatomy. RADIATION DOSE REDUCTION: This exam was performed according to the departmental dose-optimization program which includes automated exposure control, adjustment of the mA and/or kV according to patient size and/or use of iterative reconstruction technique. CONTRAST:  8mL OMNIPAQUE IOHEXOL 350 MG/ML SOLN COMPARISON:  None Available. FINDINGS: Cardiovascular: Some of the most peripheral segmental and subsegmental pulmonary artery branches can not be definitively characterized due to patient breathing motion artifact, however, there is no pulmonary embolism identified within the main, lobar or central segmental pulmonary arteries bilaterally. No thoracic aortic aneurysm. Scattered aortic atherosclerosis. Diffuse coronary artery calcifications. No pericardial effusion. Mediastinum/Nodes: No mass or enlarged lymph nodes are seen within the mediastinum or perihilar regions. Esophagus is unremarkable. Trachea and central bronchi are unremarkable. Lungs/Pleura: Bilateral pleural effusions, both moderate to large in size, RIGHT slightly greater than LEFT, with associated compressive atelectasis. No pneumothorax. Upper Abdomen: No acute findings on  limited images. Musculoskeletal: Degenerative spondylosis of the thoracolumbar spine,  mild to moderate in degree. No acute-appearing osseous abnormality. Review of the MIP images confirms the above findings. IMPRESSION: 1. Bilateral pleural effusions, both moderate to large in size, RIGHT slightly greater than LEFT, with associated compressive atelectasis. 2. No pulmonary embolism identified, with mild study limitations detailed above. 3. Diffuse coronary artery calcifications, particularly dense within the LEFT main and LEFT anterior descending coronary artery. Electronically Signed   By: Franki Cabot M.D.   On: 07/26/2022 16:16   DG Chest Port 1 View  Result Date: 07/26/2022 CLINICAL DATA:  Dyspnea EXAM: PORTABLE CHEST 1 VIEW COMPARISON:  05/06/2022 chest radiograph. FINDINGS: Stable configuration of 3 lead left subclavian ICD. Stable cardiomediastinal silhouette with mild to moderate cardiomegaly. No pneumothorax. Small bilateral pleural effusions, right greater than left. Mild pulmonary edema. Hazy bibasilar lung opacities. IMPRESSION: 1. Mild congestive heart failure. 2. Small bilateral pleural effusions, right greater than left. 3. Hazy bibasilar lung opacities, favor atelectasis. Electronically Signed   By: Ilona Sorrel M.D.   On: 07/26/2022 15:46   (Echo, Carotid, EGD, Colonoscopy, ERCP)    Subjective: Seen receiving hemodialysis.  Intermittently cries and tearful.  Want something for anxiety.  When we discussed about is able to go back to [facility], he was happy.  He does not think he is able to go home.    Discharge Exam: Vitals:   07/31/22 1055 07/31/22 1130  BP: (!) 94/47 (!) 93/52  Pulse: 88 91  Resp: 18 (!) 38  Temp:    SpO2: 92%    Vitals:   07/31/22 1015 07/31/22 1030 07/31/22 1055 07/31/22 1130  BP: (!) 109/55 (!) 109/51 (!) 94/47 (!) 93/52  Pulse: 92 88 88 91  Resp: (!) 33 (!) 37 18 (!) 38  Temp:      TempSrc:      SpO2: 96%  92%   Weight:      Height:         General: Calm comfortable.  Emotional labile and anxious at times.  He starts crying but can be consoled. Cardiovascular: S1-S2 normal.  Regular rate rhythm.  Left precordial pacemaker in place. Respiratory: Bilateral good air entry, additional coarse crackles at the posterior base.  On room air. Gastrointestinal: Soft.  Nontender.  Obese and pendulous. Ext: No swelling or edema.  He has some ecchymotic changes on the left foot and leg with some calluses. Neuro: Alert awake and oriented.  anxious mood.  Flat affect.    The results of significant diagnostics from this hospitalization (including imaging, microbiology, ancillary and laboratory) are listed below for reference.     Microbiology: Recent Results (from the past 240 hour(s))  Blood culture (routine x 2)     Status: None   Collection Time: 07/26/22  3:17 PM   Specimen: BLOOD LEFT FOREARM  Result Value Ref Range Status   Specimen Description BLOOD LEFT FOREARM  Final   Special Requests   Final    BOTTLES DRAWN AEROBIC AND ANAEROBIC Blood Culture adequate volume   Culture   Final    NO GROWTH 5 DAYS Performed at [facility], [address], [place], [place] [address]    Report Status 07/31/2022 FINAL  Final  Resp panel by RT-PCR (RSV, Flu A&B, Covid) Nasopharyngeal Swab     Status: None   Collection Time: 07/26/22  4:14 PM   Specimen: Nasopharyngeal Swab; Nasal Swab  Result Value Ref Range Status   SARS Coronavirus 2 by RT PCR NEGATIVE NEGATIVE Final    Comment: (  NOTE) SARS-CoV-2 target nucleic acids are NOT DETECTED.  The SARS-CoV-2 RNA is generally detectable in upper respiratory specimens during the acute phase of infection. The lowest concentration of SARS-CoV-2 viral copies this assay can detect is 138 copies/mL. A negative result does not preclude SARS-Cov-2 infection and should not be used as the sole basis for treatment or other patient management decisions. A negative result may occur with  improper  specimen collection/handling, submission of specimen other than nasopharyngeal swab, presence of viral mutation(s) within the areas targeted by this assay, and inadequate number of viral copies(<138 copies/mL). A negative result must be combined with clinical observations, patient history, and epidemiological information. The expected result is Negative.  Fact Sheet for Patients:  EntrepreneurPulse.com.au  Fact Sheet for Healthcare Providers:  IncredibleEmployment.be  This test is no t yet approved or cleared by the [place] FDA and  has been authorized for detection and/or diagnosis of SARS-CoV-2 by FDA under an Emergency Use Authorization (EUA). This EUA will remain  in effect (meaning this test can be used) for the duration of the COVID-19 declaration under Section 564(b)(1) of the Act, 21 U.S.C.section 360bbb-3(b)(1), unless the authorization is terminated  or revoked sooner.       Influenza A by PCR NEGATIVE NEGATIVE Final   Influenza B by PCR NEGATIVE NEGATIVE Final    Comment: (NOTE) The Xpert Xpress SARS-CoV-2/FLU/RSV plus assay is intended as an aid in the diagnosis of influenza from Nasopharyngeal swab specimens and should not be used as a sole basis for treatment. Nasal washings and aspirates are unacceptable for Xpert Xpress SARS-CoV-2/FLU/RSV testing.  Fact Sheet for Patients: EntrepreneurPulse.com.au  Fact Sheet for Healthcare Providers: IncredibleEmployment.be  This test is not yet approved or cleared by the [place] FDA and has been authorized for detection and/or diagnosis of SARS-CoV-2 by FDA under an Emergency Use Authorization (EUA). This EUA will remain in effect (meaning this test can be used) for the duration of the COVID-19 declaration under Section 564(b)(1) of the Act, 21 U.S.C. section 360bbb-3(b)(1), unless the authorization is terminated or revoked.     Resp  Syncytial Virus by PCR NEGATIVE NEGATIVE Final    Comment: (NOTE) Fact Sheet for Patients: EntrepreneurPulse.com.au  Fact Sheet for Healthcare Providers: IncredibleEmployment.be  This test is not yet approved or cleared by the [place] FDA and has been authorized for detection and/or diagnosis of SARS-CoV-2 by FDA under an Emergency Use Authorization (EUA). This EUA will remain in effect (meaning this test can be used) for the duration of the COVID-19 declaration under Section 564(b)(1) of the Act, 21 U.S.C. section 360bbb-3(b)(1), unless the authorization is terminated or revoked.  Performed at [facility], [place] [address], [place], [place] [address]   MRSA Next Gen by PCR, Nasal     Status: None   Collection Time: 07/27/22  6:26 PM   Specimen: Nasal Mucosa; Nasal Swab  Result Value Ref Range Status   MRSA by PCR Next Gen NOT DETECTED NOT DETECTED Final    Comment: (NOTE) The GeneXpert MRSA Assay (FDA approved for NASAL specimens only), is one component of a comprehensive MRSA colonization surveillance program. It is not intended to diagnose MRSA infection nor to guide or monitor treatment for MRSA infections. Test performance is not FDA approved in patients less than 66 years old. Performed at [facility], [place] [address], [place], [place] [address]      Labs: BNP (last 3 results) Recent Labs    07/26/22 1505  BNP 3,710.6*   Basic Metabolic Panel: Recent Labs  Lab 07/26/22 1505 07/26/22 1526 07/26/22 1618 07/26/22 2211 07/27/22 0005 07/28/22 0019 07/29/22 0028 07/31/22 0920  NA 135  --    < > 137 136 135 136 135  K 4.1  --    < > 4.3 4.5 3.5 3.7 3.5  CL 93*  --   --   --  94* 94* 93* 95*  CO2 30  --   --   --  28 30 28 27   GLUCOSE 163*  --   --   --  116* 138* 99 153*  BUN 31*  --   --   --  34* 27* 20 29*  CREATININE 5.96*  --   --   --  6.54*  6.51* 5.12* 4.01* 6.11*  CALCIUM 8.9  --   --   --  8.7*  9.1 9.2 9.3  MG  --  2.1  --   --  2.1  --   --   --   PHOS  --   --   --   --  4.8* 3.8 3.2 3.3   < > = values in this interval not displayed.   Liver Function Tests: Recent Labs  Lab 07/27/22 0005 07/28/22 0019 07/29/22 0028 07/31/22 0920  ALBUMIN 3.0* 2.8* 2.9* 3.3*   No results for input(s): "LIPASE", "AMYLASE" in the last 168 hours. No results for input(s): "AMMONIA" in the last 168 hours. CBC: Recent Labs  Lab 07/26/22 1505 07/26/22 1618 07/27/22 0005 07/28/22 0019 07/29/22 0028 07/29/22 1353 07/31/22 0920  WBC 14.2*  --  11.8* 9.3  --  8.3 7.7  NEUTROABS 11.0*  --   --   --   --   --   --   HGB 7.7*   < > 7.7* 7.9* 8.4* 8.2* 8.6*  HCT 24.1*   < > 24.0* 24.8* 26.1* 25.0* 27.7*  MCV 103.4*  --  101.3* 100.4*  --  100.8* 101.8*  PLT 179  --  178 178  --  224 248   < > = values in this interval not displayed.   Cardiac Enzymes: No results for input(s): "CKTOTAL", "CKMB", "CKMBINDEX", "TROPONINI" in the last 168 hours. BNP: Invalid input(s): "POCBNP" CBG: No results for input(s): "GLUCAP" in the last 168 hours. D-Dimer No results for input(s): "DDIMER" in the last 72 hours. Hgb A1c No results for input(s): "HGBA1C" in the last 72 hours. Lipid Profile No results for input(s): "CHOL", "HDL", "LDLCALC", "TRIG", "CHOLHDL", "LDLDIRECT" in the last 72 hours. Thyroid function studies No results for input(s): "TSH", "T4TOTAL", "T3FREE", "THYROIDAB" in the last 72 hours.  Invalid input(s): "FREET3" Anemia work up No results for input(s): "VITAMINB12", "FOLATE", "FERRITIN", "TIBC", "IRON", "RETICCTPCT" in the last 72 hours. Urinalysis    Component Value Date/Time   COLORURINE YELLOW 04/07/2019 2001   APPEARANCEUR CLOUDY (A) 04/07/2019 2001   LABSPEC 1.015 04/07/2019 2001   PHURINE 5.0 04/07/2019 2001   GLUCOSEU 50 (A) 04/07/2019 2001   GLUCOSEU NEGATIVE 03/25/2017 1309   HGBUR MODERATE (A) 04/07/2019 2001   BILIRUBINUR NEGATIVE 04/07/2019 2001   BILIRUBINUR  negative 04/04/2015 1229   BILIRUBINUR neg 02/27/2015 [place] 04/07/2019 2001   PROTEINUR >=300 (A) 04/07/2019 2001   UROBILINOGEN 0.2 03/25/2017 1309   NITRITE NEGATIVE 04/07/2019 2001   LEUKOCYTESUR LARGE (A) 04/07/2019 2001   Sepsis Labs Recent Labs  Lab 07/27/22 0005 07/28/22 0019 07/29/22 1353 07/31/22 0920  WBC 11.8* 9.3  8.3 7.7   Microbiology Recent Results (from the past 240 hour(s))  Blood culture (routine x 2)     Status: None   Collection Time: 07/26/22  3:17 PM   Specimen: BLOOD LEFT FOREARM  Result Value Ref Range Status   Specimen Description BLOOD LEFT FOREARM  Final   Special Requests   Final    BOTTLES DRAWN AEROBIC AND ANAEROBIC Blood Culture adequate volume   Culture   Final    NO GROWTH 5 DAYS Performed at [facility], [address], [place], [place] [address]    Report Status 07/31/2022 FINAL  Final  Resp panel by RT-PCR (RSV, Flu A&B, Covid) Nasopharyngeal Swab     Status: None   Collection Time: 07/26/22  4:14 PM   Specimen: Nasopharyngeal Swab; Nasal Swab  Result Value Ref Range Status   SARS Coronavirus 2 by RT PCR NEGATIVE NEGATIVE Final    Comment: (NOTE) SARS-CoV-2 target nucleic acids are NOT DETECTED.  The SARS-CoV-2 RNA is generally detectable in upper respiratory specimens during the acute phase of infection. The lowest concentration of SARS-CoV-2 viral copies this assay can detect is 138 copies/mL. A negative result does not preclude SARS-Cov-2 infection and should not be used as the sole basis for treatment or other patient management decisions. A negative result may occur with  improper specimen collection/handling, submission of specimen other than nasopharyngeal swab, presence of viral mutation(s) within the areas targeted by this assay, and inadequate number of viral copies(<138 copies/mL). A negative result must be combined with clinical observations, patient history, and epidemiological information.  The expected result is Negative.  Fact Sheet for Patients:  EntrepreneurPulse.com.au  Fact Sheet for Healthcare Providers:  IncredibleEmployment.be  This test is no t yet approved or cleared by the [place] FDA and  has been authorized for detection and/or diagnosis of SARS-CoV-2 by FDA under an Emergency Use Authorization (EUA). This EUA will remain  in effect (meaning this test can be used) for the duration of the COVID-19 declaration under Section 564(b)(1) of the Act, 21 U.S.C.section 360bbb-3(b)(1), unless the authorization is terminated  or revoked sooner.       Influenza A by PCR NEGATIVE NEGATIVE Final   Influenza B by PCR NEGATIVE NEGATIVE Final    Comment: (NOTE) The Xpert Xpress SARS-CoV-2/FLU/RSV plus assay is intended as an aid in the diagnosis of influenza from Nasopharyngeal swab specimens and should not be used as a sole basis for treatment. Nasal washings and aspirates are unacceptable for Xpert Xpress SARS-CoV-2/FLU/RSV testing.  Fact Sheet for Patients: EntrepreneurPulse.com.au  Fact Sheet for Healthcare Providers: IncredibleEmployment.be  This test is not yet approved or cleared by the [place] FDA and has been authorized for detection and/or diagnosis of SARS-CoV-2 by FDA under an Emergency Use Authorization (EUA). This EUA will remain in effect (meaning this test can be used) for the duration of the COVID-19 declaration under Section 564(b)(1) of the Act, 21 U.S.C. section 360bbb-3(b)(1), unless the authorization is terminated or revoked.     Resp Syncytial Virus by PCR NEGATIVE NEGATIVE Final    Comment: (NOTE) Fact Sheet for Patients: EntrepreneurPulse.com.au  Fact Sheet for Healthcare Providers: IncredibleEmployment.be  This test is not yet approved or cleared by the [place] FDA and has been authorized for detection and/or  diagnosis of SARS-CoV-2 by FDA under an Emergency Use Authorization (EUA). This EUA will remain in effect (meaning this test can be used) for the duration of the COVID-19 declaration under Section 564(b)(1) of  the Act, 21 U.S.C. section 360bbb-3(b)(1), unless the authorization is terminated or revoked.  Performed at [facility], [place] [address] [place], [place] [address]   MRSA Next Gen by PCR, Nasal     Status: None   Collection Time: 07/27/22  6:26 PM   Specimen: Nasal Mucosa; Nasal Swab  Result Value Ref Range Status   MRSA by PCR Next Gen NOT DETECTED NOT DETECTED Final    Comment: (NOTE) The GeneXpert MRSA Assay (FDA approved for NASAL specimens only), is one component of a comprehensive MRSA colonization surveillance program. It is not intended to diagnose MRSA infection nor to guide or monitor treatment for MRSA infections. Test performance is not FDA approved in patients less than 31 years old. Performed at [facility], [place] [address] [place], [place] [address]      Time coordinating discharge: 35 minutes  SIGNED:   Barb Merino, MD  [facility] 07/31/2022, 11:39 AM

## 2022-07-31 NOTE — Progress Notes (Signed)
Report called to Nunzio Cory at [facility].  All questions answered.  Awaiting transport by [facility].

## 2022-07-31 NOTE — Progress Notes (Signed)
[facility] Progress Note   Assessment/ Plan:   P HD: [facility] MWF  4h  400/1.5  94kg  2/2 bath  AVF  Hep 2500+ 1046midrun - venofer 100mg  tiw thru 1/24 - mircera 150 mcg q2, last 12/31, due 1/14 (today) - last HD 1/12, post wt 95.9kg   - dry wt recently raised 93 > 94kg   Assessment/ Plan: SOB/ resp distress - w/ bronchspasm per ED notes, also mild CHF by xray. +LE edema. HD with 3L off 07/27/22, Extra HD 1/16, short HD 07/29/22 to get back on schedule.  Next HD 1/19 ESRD - on HD MWF. Has not missed HD. HD 1/15, 1/16, 1/17.  Next HD Friday HTN/ volume - BP's wnl, vol excess as above.  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  MBD ckd - Ca in range, add on phos/ albumin.  CAD / sp AICD Elevated troponin: cardiology following, TTE with EF 45%, G2DD Dispo: pending  Subjective:    Seen on dialysis.  Still reporting some anxiety.  Thinks O2 helped- sat's didn't drop but applied for comfort.   Objective:   BP (!) 110/50   Pulse 85   Temp 97.7 F (36.5 C)   Resp (!) 25   Ht 5\' 7"  (1.702 m)   Wt 88.3 kg   SpO2 98%   BMI 30.49 kg/m   Physical Exam: Gen:NAD, lying flat in bed CVS: RRR II VI SEM Resp: clear LZJ:QBHA Ext: trace LE edema- much better ACCESS: L AVF  Labs: BMET Recent Labs  Lab 07/26/22 1505 07/26/22 1618 07/26/22 2211 07/27/22 0005 07/28/22 0019 07/29/22 0028  NA 135 137 137 136 135 136  K 4.1 4.1 4.3 4.5 3.5 3.7  CL 93*  --   --  94* 94* 93*  CO2 30  --   --  28 30 28   GLUCOSE 163*  --   --  116* 138* 99  BUN 31*  --   --  34* 27* 20  CREATININE 5.96*  --   --  6.54*  6.51* 5.12* 4.01*  CALCIUM 8.9  --   --  8.7* 9.1 9.2  PHOS  --   --   --  4.8* 3.8 3.2   CBC Recent Labs  Lab 07/26/22 1505 07/26/22 1618 07/27/22 0005 07/28/22 0019 07/29/22 0028 07/29/22 1353 07/31/22 0920  WBC 14.2*  --  11.8* 9.3  --  8.3 7.7  NEUTROABS 11.0*  --   --   --   --   --   --   HGB 7.7*   < > 7.7* 7.9* 8.4* 8.2* 8.6*   HCT 24.1*   < > 24.0* 24.8* 26.1* 25.0* 27.7*  MCV 103.4*  --  101.3* 100.4*  --  100.8* 101.8*  PLT 179  --  178 178  --  224 248   < > = values in this interval not displayed.      Medications:     heparin sodium (porcine)       heparin sodium (porcine)       aspirin EC  81 mg Oral Daily   atorvastatin  80 mg Oral QHS   calcium carbonate  1 tablet Oral TID WC   Chlorhexidine Gluconate Cloth  6 each Topical Q0600   cyanocobalamin  1,000 mcg Oral Daily   darbepoetin (ARANESP) injection - DIALYSIS  150 mcg Subcutaneous Q Mon-1800   docusate sodium  100 mg Oral BID  ezetimibe  10 mg Oral Daily   gabapentin  100 mg Oral QHS   heparin  5,000 Units Subcutaneous Q8H   melatonin  3 mg Oral QHS   midodrine  10 mg Oral Q M,W,F-HD   multivitamin  1 tablet Oral QHS   pantoprazole  40 mg Oral Daily   polyethylene glycol  17 g Oral Daily   polyvinyl alcohol  2 drop Both Eyes TID   saccharomyces boulardii  250 mg Oral BID   sevelamer carbonate  1,600 mg Oral TID WC   ticagrelor  90 mg Oral BID   traZODone  25 mg Oral QHS     Madelon Lips MD 07/31/2022, 10:16 AM

## 2022-07-31 NOTE — Progress Notes (Signed)
OT Cancellation Note  Patient Details Name: Marquice Uddin MRN: [id number] DOB: 1932/05/24   Cancelled Treatment:    Reason Eval/Treat Not Completed: Patient at procedure or test/ unavailable. Pt in dialysis.  [place], OTR/L [facility] [phone] Office [phone]    Almon Register 07/31/2022, 9:59 AM

## 2022-07-31 NOTE — Procedures (Signed)
Patient seen and examined on Hemodialysis. The procedure was supervised and I have made appropriate changes. BP (!) 109/55   Pulse 92   Temp 97.7 F (36.5 C)   Resp (!) 33   Ht 5\' 7"  (1.702 m)   Wt 88.3 kg   SpO2 96%   BMI 30.49 kg/m   QB 400 mL/ min via AVF, UF goal 2l  Tolerating treatment without complaints at this time.   Madelon Lips MD [facility] [phone] 10:18 AM

## 2022-07-31 NOTE — Progress Notes (Signed)
PROGRESS NOTE    Rodney Farley  ZOX:[id number] DOB: [birth year] DOA: 07/26/2022 PCP: [name], MD    Brief Narrative:  Rodney Farley is an 89 year old male with past medical history of anxiety and depression, chronic systolic heart failure, COPD, diabetes mellitus, peripheral neuropathy, hyperlipidemia, hypertension, sleep apnea, AICD who presented from skilled nursing facility on 1/14 with chest pain and shortness of breath.  He was found to be hypoxic. CTA showed no PE or pneumonia, but did reveal pleural effusions R > L and appeared volume overloaded. HD was performed with clinical improvement. Remains in poor clinical status due to frequent intolerance to hemodialysis and fluid removal.  Waiting to go to a SNF  Assessment & Plan:   Acute hypoxic respiratory failure due to pulmonary edema and pleural effusions, acute on chronic HFrEF: No infectious symptoms, RSV, flu, covid panel negative. - Continue volume management with HD per nephrology. Performed 1/15, extra 1/16, 1/17. Getting HD today on his schedule. - Repeat echo is relatively unchanged with stable EF 40-45%, LAD distribution hypokinesis. Cardiology discussed with patient, family, and plans on optimizing medical management for now.  - Adding back low dose coreg (hypotension limiting GDMT). Continue imdur 15mg , hydralazine 10mg  TID. - Symptomatically remains tenuous.  -Repeat chest x-ray shows pulmonary edema but no significant pleural effusion to drain.  CAD, ICM, s/p AICD, HLD: Troponin elevation currently suspected to be due to demand ischemia in setting of acute CHF and ESRD. History of ISR of ostial LAD stent s/p balloon angioplasty Oct 2023 with limited options. Echo with stable WMA. - Appreciate cardiology assessment and recommendations. Medical management. - Continue BB, long acting nitrate, ASA, brilinta, not starting heparin at this time.  - Continue statin, zetia, outpatient repatha.    ESRD,  anemia of ESRD, secondary hyperparathyroidism:  - HD per nephrology, typically MWF schedule.   History of left ankle fracture: Nov 2023.  -Discussed with orthopedics.  Weightbearing as tolerated with Cam boot.   Chronic hypotension:  - Continue intradialytic midodrine (MWF)   GERD:  - PPI   OSA:  - CPAP  Transfer to skilled nursing facility when bed is available.    DVT prophylaxis: heparin injection 5,000 Units Start: 07/27/22 1400   Code Status: DNR Family Communication: Daughter-in-law on the phone 1/18 Disposition Plan: Status is: Inpatient Remains inpatient appropriate because: Medically stable today to transfer to SNF level of care only.     Consultants:  Cardiology Nephrology  Procedures:  Hemodialysis  Antimicrobials:  None   Subjective:  Patient seen and examined.  Getting hemodialysis.  Emotionally labile and tearful as always.  Patient tells me that Atarax did not help much on his anxiety.  He wants to try some other anxiety medicine.  Will use short acting Ativan. Patient was happy to know that we are trying to send him to [facility] for further rehab.  Objective: Vitals:   07/31/22 0925 07/31/22 1015 07/31/22 1030 07/31/22 1055  BP: (!) 110/50 (!) 109/55 (!) 109/51 (!) 94/47  Pulse: 85 92 88 88  Resp: (!) 25 (!) 33 (!) 37 18  Temp:      TempSrc:      SpO2: 98% 96%  92%  Weight:      Height:        Intake/Output Summary (Last 24 hours) at 07/31/2022 1133 Last data filed at 07/31/2022 0700 Gross per 24 hour  Intake 120 ml  Output --  Net 120 ml    [employer]  07/28/22 1142 07/29/22 1342 07/31/22 0750  Weight: 88.9 kg 87.9 kg 88.3 kg    Examination:  General: Calm comfortable.  Emotional labile and anxious at times.  He starts crying but can be consoled. Cardiovascular: S1-S2 normal.  Regular rate rhythm.  Left precordial pacemaker in place. Respiratory: Bilateral good air entry, additional coarse crackles at the posterior base.  On  room air. Gastrointestinal: Soft.  Nontender.  Obese and pendulous. Ext: No swelling or edema.  He has some ecchymotic changes on the left foot and leg with some calluses. Neuro: Alert awake and oriented.  anxious mood.  Flat affect.  Data Reviewed: I have personally reviewed following labs and imaging studies  CBC: Recent Labs  Lab 07/26/22 1505 07/26/22 1618 07/27/22 0005 07/28/22 0019 07/29/22 0028 07/29/22 1353 07/31/22 0920  WBC 14.2*  --  11.8* 9.3  --  8.3 7.7  NEUTROABS 11.0*  --   --   --   --   --   --   HGB 7.7*   < > 7.7* 7.9* 8.4* 8.2* 8.6*  HCT 24.1*   < > 24.0* 24.8* 26.1* 25.0* 27.7*  MCV 103.4*  --  101.3* 100.4*  --  100.8* 101.8*  PLT 179  --  178 178  --  224 248   < > = values in this interval not displayed.    Basic Metabolic Panel: Recent Labs  Lab 07/26/22 1505 07/26/22 1526 07/26/22 1618 07/26/22 2211 07/27/22 0005 07/28/22 0019 07/29/22 0028 07/31/22 0920  NA 135  --    < > 137 136 135 136 135  K 4.1  --    < > 4.3 4.5 3.5 3.7 3.5  CL 93*  --   --   --  94* 94* 93* 95*  CO2 30  --   --   --  28 30 28 27   GLUCOSE 163*  --   --   --  116* 138* 99 153*  BUN 31*  --   --   --  34* 27* 20 29*  CREATININE 5.96*  --   --   --  6.54*  6.51* 5.12* 4.01* 6.11*  CALCIUM 8.9  --   --   --  8.7* 9.1 9.2 9.3  MG  --  2.1  --   --  2.1  --   --   --   PHOS  --   --   --   --  4.8* 3.8 3.2 3.3   < > = values in this interval not displayed.    GFR: Estimated Creatinine Clearance: 8.9 mL/min (A) (by C-G formula based on SCr of 6.11 mg/dL (H)). Liver Function Tests: Recent Labs  Lab 07/27/22 0005 07/28/22 0019 07/29/22 0028 07/31/22 0920  ALBUMIN 3.0* 2.8* 2.9* 3.3*    No results for input(s): "LIPASE", "AMYLASE" in the last 168 hours. No results for input(s): "AMMONIA" in the last 168 hours. Coagulation Profile: No results for input(s): "INR", "PROTIME" in the last 168 hours. Cardiac Enzymes: No results for input(s): "CKTOTAL", "CKMB",  "CKMBINDEX", "TROPONINI" in the last 168 hours. BNP (last 3 results) No results for input(s): "PROBNP" in the last 8760 hours. HbA1C: No results for input(s): "HGBA1C" in the last 72 hours. CBG: No results for input(s): "GLUCAP" in the last 168 hours. Lipid Profile: No results for input(s): "CHOL", "HDL", "LDLCALC", "TRIG", "CHOLHDL", "LDLDIRECT" in the last 72 hours. Thyroid Function Tests: No results for input(s): "TSH", "T4TOTAL", "FREET4", "T3FREE", "THYROIDAB" in the last  72 hours. Anemia Panel: No results for input(s): "VITAMINB12", "FOLATE", "FERRITIN", "TIBC", "IRON", "RETICCTPCT" in the last 72 hours. Sepsis Labs: Recent Labs  Lab 07/26/22 1526 07/26/22 1934  LATICACIDVEN 2.8* 2.2*     Recent Results (from the past 240 hour(s))  Blood culture (routine x 2)     Status: None   Collection Time: 07/26/22  3:17 PM   Specimen: BLOOD LEFT FOREARM  Result Value Ref Range Status   Specimen Description BLOOD LEFT FOREARM  Final   Special Requests   Final    BOTTLES DRAWN AEROBIC AND ANAEROBIC Blood Culture adequate volume   Culture   Final    NO GROWTH 5 DAYS Performed at [facility], [place] [address], [place], [place] [address]    Report Status 07/31/2022 FINAL  Final  Resp panel by RT-PCR (RSV, Flu A&B, Covid) Nasopharyngeal Swab     Status: None   Collection Time: 07/26/22  4:14 PM   Specimen: Nasopharyngeal Swab; Nasal Swab  Result Value Ref Range Status   SARS Coronavirus 2 by RT PCR NEGATIVE NEGATIVE Final    Comment: (NOTE) SARS-CoV-2 target nucleic acids are NOT DETECTED.  The SARS-CoV-2 RNA is generally detectable in upper respiratory specimens during the acute phase of infection. The lowest concentration of SARS-CoV-2 viral copies this assay can detect is 138 copies/mL. A negative result does not preclude SARS-Cov-2 infection and should not be used as the sole basis for treatment or other patient management decisions. A negative result may occur with   improper specimen collection/handling, submission of specimen other than nasopharyngeal swab, presence of viral mutation(s) within the areas targeted by this assay, and inadequate number of viral copies(<138 copies/mL). A negative result must be combined with clinical observations, patient history, and epidemiological information. The expected result is Negative.  Fact Sheet for Patients:  EntrepreneurPulse.com.au  Fact Sheet for Healthcare Providers:  IncredibleEmployment.be  This test is no t yet approved or cleared by the [place] FDA and  has been authorized for detection and/or diagnosis of SARS-CoV-2 by FDA under an Emergency Use Authorization (EUA). This EUA will remain  in effect (meaning this test can be used) for the duration of the COVID-19 declaration under Section 564(b)(1) of the Act, 21 U.S.C.section 360bbb-3(b)(1), unless the authorization is terminated  or revoked sooner.       Influenza A by PCR NEGATIVE NEGATIVE Final   Influenza B by PCR NEGATIVE NEGATIVE Final    Comment: (NOTE) The Xpert Xpress SARS-CoV-2/FLU/RSV plus assay is intended as an aid in the diagnosis of influenza from Nasopharyngeal swab specimens and should not be used as a sole basis for treatment. Nasal washings and aspirates are unacceptable for Xpert Xpress SARS-CoV-2/FLU/RSV testing.  Fact Sheet for Patients: EntrepreneurPulse.com.au  Fact Sheet for Healthcare Providers: IncredibleEmployment.be  This test is not yet approved or cleared by the [place] FDA and has been authorized for detection and/or diagnosis of SARS-CoV-2 by FDA under an Emergency Use Authorization (EUA). This EUA will remain in effect (meaning this test can be used) for the duration of the COVID-19 declaration under Section 564(b)(1) of the Act, 21 U.S.C. section 360bbb-3(b)(1), unless the authorization is terminated or revoked.      Resp Syncytial Virus by PCR NEGATIVE NEGATIVE Final    Comment: (NOTE) Fact Sheet for Patients: EntrepreneurPulse.com.au  Fact Sheet for Healthcare Providers: IncredibleEmployment.be  This test is not yet approved or cleared by the [place] FDA and has been authorized for detection and/or diagnosis of  SARS-CoV-2 by FDA under an Emergency Use Authorization (EUA). This EUA will remain in effect (meaning this test can be used) for the duration of the COVID-19 declaration under Section 564(b)(1) of the Act, 21 U.S.C. section 360bbb-3(b)(1), unless the authorization is terminated or revoked.  Performed at [facility], [place] [address] [place], [place] [address]   MRSA Next Gen by PCR, Nasal     Status: None   Collection Time: 07/27/22  6:26 PM   Specimen: Nasal Mucosa; Nasal Swab  Result Value Ref Range Status   MRSA by PCR Next Gen NOT DETECTED NOT DETECTED Final    Comment: (NOTE) The GeneXpert MRSA Assay (FDA approved for NASAL specimens only), is one component of a comprehensive MRSA colonization surveillance program. It is not intended to diagnose MRSA infection nor to guide or monitor treatment for MRSA infections. Test performance is not FDA approved in patients less than 65 years old. Performed at [facility], [place] [address], [place],  [address]          Radiology Studies: DG CHEST PORT 1 VIEW  Result Date: 07/30/2022 CLINICAL DATA:  Respiratory failure with hypoxia and hypercapnia. Shortness of breath, history of CHF. EXAM: PORTABLE CHEST 1 VIEW COMPARISON:  Chest radiograph 07/29/2022 at 0557 hours. FINDINGS: 0915 hours. Unchanged dual chamber AICD/pacemaker over the left chest with leads projecting over the right atrium, right ventricle and coronary sinus. Increased patchy reticular opacities throughout both lungs with pulmonary venous congestion and new small right and trace left pleural effusion. No  pneumothorax. Stable cardiomegaly. Unchanged atherosclerotic calcifications of the aorta. Visualized portions of the bones and upper abdomen are unremarkable. IMPRESSION: 1. Worsening pulmonary edema with new small right and trace left pleural effusions. 2.  Aortic Atherosclerosis (ICD10-I70.0). Electronically Signed   By: Emmit Alexanders M.D.   On: 07/30/2022 09:23        Scheduled Meds:  aspirin EC  81 mg Oral Daily   atorvastatin  80 mg Oral QHS   calcium carbonate  1 tablet Oral TID WC   Chlorhexidine Gluconate Cloth  6 each Topical Q0600   cyanocobalamin  1,000 mcg Oral Daily   darbepoetin (ARANESP) injection - DIALYSIS  150 mcg Subcutaneous Q Mon-1800   docusate sodium  100 mg Oral BID   ezetimibe  10 mg Oral Daily   gabapentin  100 mg Oral QHS   heparin  5,000 Units Subcutaneous Q8H   melatonin  3 mg Oral QHS   midodrine  10 mg Oral Q M,W,F-HD   multivitamin  1 tablet Oral QHS   pantoprazole  40 mg Oral Daily   polyethylene glycol  17 g Oral Daily   polyvinyl alcohol  2 drop Both Eyes TID   saccharomyces boulardii  250 mg Oral BID   sevelamer carbonate  1,600 mg Oral TID WC   ticagrelor  90 mg Oral BID   traZODone  25 mg Oral QHS   Continuous Infusions:  promethazine (PHENERGAN) injection (IM or IVPB) Stopped (07/28/22 0807)     LOS: 5 days    Time spent: 35 minutes    Barb Merino, MD [facility] Pager [phone]

## 2022-07-31 NOTE — TOC Transition Note (Signed)
Transition of Care [facility]) - CM/SW Discharge Note   Patient Details  Name: Rodney Farley MRN: [id number] Date of Birth: 26-Nov-1931  Transition of Care [facility]) CM/SW Contact:  Amador Cunas, [place] Phone Number: 07/31/2022, 2:42 PM   Clinical Narrative:  Pt for dc to [facility] today. [employer] auth received for ambulance and SNF. Confirmed with Janie at [facility] they are prepared to admit pt to room 3250. Pt's DIL aware of dc and reports agreeable. RN provided with number for repot and PTAR arranged for transport. SW signing off at dc.  Wandra Feinstein, MSW, LCSW [phone] (coverage)       Final next level of care: [place] Barriers to Discharge: No Barriers Identified   Patient Goals and CMS Choice   Choice offered to / list presented to : Adult Children  Discharge Placement                Patient chooses bed at: [facility] Patient to be transferred to facility by: [place] Name of family member notified: Linda/DIL Patient and family notified of of transfer: 07/31/22  Discharge Plan and Services Additional resources added to the After Visit Summary for                                       Social Determinants of Health (SDOH) Interventions SDOH Screenings   Food Insecurity: No Food Insecurity (07/29/2022)  Housing: Low Risk  (07/29/2022)  Transportation Needs: No Transportation Needs (07/29/2022)  Utilities: Not At Risk (07/29/2022)  Depression (PHQ2-9): Low Risk  (05/19/2022)  Tobacco Use: Medium Risk (07/27/2022)     Readmission Risk Interventions    05/08/2022    4:03 PM 04/02/2022    3:35 PM  Readmission Risk Prevention Plan  Transportation Screening Complete Complete  Medication Review Press photographer) Complete Complete  PCP or Specialist appointment within 3-5 days of discharge  Complete  HRI or [place] Complete Complete  SW Recovery Care/Counseling Consult Complete Complete   Palliative Care Screening Not Applicable Not [place] Not Applicable Not Applicable

## 2022-07-31 NOTE — Progress Notes (Signed)
Nursing DC note  Patient alert and oriented, verbalized understanding og dc instructions. Ccmd notified of dc order. DNR document returned to patient.Written prescriptions also inside envelope.Patient now waiting for transport to [facility].

## 2022-07-31 NOTE — Progress Notes (Addendum)
Received patient in his bed,before the start of the treatment.Awake alert and oriented x 4. Blood pressure on the soft side but patient looks anxious.  Access used: Right upper arm fistula that works well during treatment.  Duration of treatment 3.5 hours.  Medicines given : Midodrine 10 mg  pre treatment.                              Vistaril  pre treatment by patient's nurse.                               Albumin 25 G ,30 minutes after thje start of treatment.                             Lorazepam  0.5 mg  an hour after the treatment started.                             Pantoprotoix 40 mg p.o                                                              Heparin 2500 units bolius at the start of the treatment and 2,500 units mid treatment.  Cartridge change : Yes   Fluid removed  :150 cc  Hemodialysis issue.After 30 minutes ,when treatment started ,blood pressure dropped.Albumin 25 gram given as ordered but it helped a little on his treatment,we were not able able to pulled some more fluid from him.Patient was very anxious had to call primary to have something to calm him down ,somewhat helped him but remained anxious and uncomfortable -he was using his abdominal muscles for his breathing,have to placed him at 2 lpm/[place] of oxygen.Renal MD and P.A at the bedside.  Hands off to the patient's nurse.

## 2022-07-31 NOTE — Progress Notes (Signed)
Mobility Specialist Progress Note    07/31/22 1526  Mobility  Activity Ambulated with assistance in hallway  Level of Assistance Contact guard assist, steadying assist  Assistive Device Front wheel walker  Distance Ambulated (ft) 100 ft  Activity Response Tolerated well  Mobility Referral Yes  $Mobility charge 1 Mobility   Pt received in chair and agreeable. C/o sensation in his L knee. Returned to chair with call bell in reach.    Hildred Alamin Mobility Specialist  Please Psychologist, sport and exercise or Rehab Office at [phone]

## 2022-08-03 DIAGNOSIS — N186 End stage renal disease: Secondary | ICD-10-CM | POA: Diagnosis not present

## 2022-08-03 DIAGNOSIS — E876 Hypokalemia: Secondary | ICD-10-CM | POA: Diagnosis not present

## 2022-08-03 DIAGNOSIS — D509 Iron deficiency anemia, unspecified: Secondary | ICD-10-CM | POA: Diagnosis not present

## 2022-08-03 DIAGNOSIS — Z992 Dependence on renal dialysis: Secondary | ICD-10-CM | POA: Diagnosis not present

## 2022-08-03 DIAGNOSIS — N2581 Secondary hyperparathyroidism of renal origin: Secondary | ICD-10-CM | POA: Diagnosis not present

## 2022-08-03 DIAGNOSIS — D689 Coagulation defect, unspecified: Secondary | ICD-10-CM | POA: Diagnosis not present

## 2022-08-04 DIAGNOSIS — E1122 Type 2 diabetes mellitus with diabetic chronic kidney disease: Secondary | ICD-10-CM | POA: Diagnosis not present

## 2022-08-04 DIAGNOSIS — E785 Hyperlipidemia, unspecified: Secondary | ICD-10-CM | POA: Diagnosis not present

## 2022-08-04 DIAGNOSIS — I25810 Atherosclerosis of coronary artery bypass graft(s) without angina pectoris: Secondary | ICD-10-CM | POA: Diagnosis not present

## 2022-08-04 DIAGNOSIS — G4733 Obstructive sleep apnea (adult) (pediatric): Secondary | ICD-10-CM | POA: Diagnosis not present

## 2022-08-04 DIAGNOSIS — D638 Anemia in other chronic diseases classified elsewhere: Secondary | ICD-10-CM | POA: Diagnosis not present

## 2022-08-04 DIAGNOSIS — I5040 Unspecified combined systolic (congestive) and diastolic (congestive) heart failure: Secondary | ICD-10-CM | POA: Diagnosis not present

## 2022-08-04 DIAGNOSIS — K219 Gastro-esophageal reflux disease without esophagitis: Secondary | ICD-10-CM | POA: Diagnosis not present

## 2022-08-04 DIAGNOSIS — F329 Major depressive disorder, single episode, unspecified: Secondary | ICD-10-CM | POA: Diagnosis not present

## 2022-08-04 DIAGNOSIS — G63 Polyneuropathy in diseases classified elsewhere: Secondary | ICD-10-CM | POA: Diagnosis not present

## 2022-08-04 DIAGNOSIS — J90 Pleural effusion, not elsewhere classified: Secondary | ICD-10-CM | POA: Diagnosis not present

## 2022-08-04 DIAGNOSIS — N186 End stage renal disease: Secondary | ICD-10-CM | POA: Diagnosis not present

## 2022-08-04 DIAGNOSIS — I10 Essential (primary) hypertension: Secondary | ICD-10-CM | POA: Diagnosis not present

## 2022-08-06 DIAGNOSIS — N186 End stage renal disease: Secondary | ICD-10-CM | POA: Diagnosis not present

## 2022-08-06 DIAGNOSIS — L97422 Non-pressure chronic ulcer of left heel and midfoot with fat layer exposed: Secondary | ICD-10-CM | POA: Diagnosis not present

## 2022-08-06 DIAGNOSIS — L97419 Non-pressure chronic ulcer of right heel and midfoot with unspecified severity: Secondary | ICD-10-CM | POA: Diagnosis not present

## 2022-08-06 DIAGNOSIS — L97423 Non-pressure chronic ulcer of left heel and midfoot with necrosis of muscle: Secondary | ICD-10-CM | POA: Diagnosis not present

## 2022-08-08 ENCOUNTER — Encounter: Payer: Self-pay | Admitting: Cardiology

## 2022-08-09 ENCOUNTER — Encounter: Payer: Self-pay | Admitting: Family Medicine

## 2022-08-10 DIAGNOSIS — N2581 Secondary hyperparathyroidism of renal origin: Secondary | ICD-10-CM | POA: Diagnosis not present

## 2022-08-10 DIAGNOSIS — Z992 Dependence on renal dialysis: Secondary | ICD-10-CM | POA: Diagnosis not present

## 2022-08-10 DIAGNOSIS — D631 Anemia in chronic kidney disease: Secondary | ICD-10-CM | POA: Diagnosis not present

## 2022-08-10 DIAGNOSIS — N186 End stage renal disease: Secondary | ICD-10-CM | POA: Diagnosis not present

## 2022-08-10 DIAGNOSIS — D509 Iron deficiency anemia, unspecified: Secondary | ICD-10-CM | POA: Diagnosis not present

## 2022-08-10 DIAGNOSIS — D689 Coagulation defect, unspecified: Secondary | ICD-10-CM | POA: Diagnosis not present

## 2022-08-10 DIAGNOSIS — E876 Hypokalemia: Secondary | ICD-10-CM | POA: Diagnosis not present

## 2022-08-11 DIAGNOSIS — I2510 Atherosclerotic heart disease of native coronary artery without angina pectoris: Secondary | ICD-10-CM | POA: Diagnosis not present

## 2022-08-11 DIAGNOSIS — S82899A Other fracture of unspecified lower leg, initial encounter for closed fracture: Secondary | ICD-10-CM | POA: Diagnosis not present

## 2022-08-11 DIAGNOSIS — G4733 Obstructive sleep apnea (adult) (pediatric): Secondary | ICD-10-CM | POA: Diagnosis not present

## 2022-08-11 DIAGNOSIS — N186 End stage renal disease: Secondary | ICD-10-CM | POA: Diagnosis not present

## 2022-08-12 ENCOUNTER — Ambulatory Visit: Payer: PPO | Admitting: Physician Assistant

## 2022-08-12 DIAGNOSIS — Z992 Dependence on renal dialysis: Secondary | ICD-10-CM | POA: Diagnosis not present

## 2022-08-12 DIAGNOSIS — N186 End stage renal disease: Secondary | ICD-10-CM | POA: Diagnosis not present

## 2022-08-12 DIAGNOSIS — E1129 Type 2 diabetes mellitus with other diabetic kidney complication: Secondary | ICD-10-CM | POA: Diagnosis not present

## 2022-08-13 DIAGNOSIS — F329 Major depressive disorder, single episode, unspecified: Secondary | ICD-10-CM | POA: Diagnosis not present

## 2022-08-13 DIAGNOSIS — I132 Hypertensive heart and chronic kidney disease with heart failure and with stage 5 chronic kidney disease, or end stage renal disease: Secondary | ICD-10-CM | POA: Diagnosis not present

## 2022-08-13 DIAGNOSIS — S82892D Other fracture of left lower leg, subsequent encounter for closed fracture with routine healing: Secondary | ICD-10-CM | POA: Diagnosis not present

## 2022-08-13 DIAGNOSIS — L97424 Non-pressure chronic ulcer of left heel and midfoot with necrosis of bone: Secondary | ICD-10-CM | POA: Diagnosis not present

## 2022-08-13 DIAGNOSIS — I5040 Unspecified combined systolic (congestive) and diastolic (congestive) heart failure: Secondary | ICD-10-CM | POA: Diagnosis not present

## 2022-08-13 DIAGNOSIS — G63 Polyneuropathy in diseases classified elsewhere: Secondary | ICD-10-CM | POA: Diagnosis not present

## 2022-08-13 DIAGNOSIS — N186 End stage renal disease: Secondary | ICD-10-CM | POA: Diagnosis not present

## 2022-08-13 DIAGNOSIS — L97423 Non-pressure chronic ulcer of left heel and midfoot with necrosis of muscle: Secondary | ICD-10-CM | POA: Diagnosis not present

## 2022-08-13 DIAGNOSIS — L97419 Non-pressure chronic ulcer of right heel and midfoot with unspecified severity: Secondary | ICD-10-CM | POA: Diagnosis not present

## 2022-08-14 ENCOUNTER — Other Ambulatory Visit: Payer: Self-pay

## 2022-08-14 ENCOUNTER — Emergency Department (HOSPITAL_COMMUNITY)
Admission: EM | Admit: 2022-08-14 | Discharge: 2022-09-11 | Disposition: E | Payer: PPO | Attending: Emergency Medicine | Admitting: Emergency Medicine

## 2022-08-14 ENCOUNTER — Encounter (HOSPITAL_COMMUNITY): Payer: Self-pay

## 2022-08-14 DIAGNOSIS — Z7982 Long term (current) use of aspirin: Secondary | ICD-10-CM | POA: Diagnosis not present

## 2022-08-14 DIAGNOSIS — Z992 Dependence on renal dialysis: Secondary | ICD-10-CM | POA: Diagnosis not present

## 2022-08-14 DIAGNOSIS — N186 End stage renal disease: Secondary | ICD-10-CM | POA: Diagnosis not present

## 2022-08-14 DIAGNOSIS — I2510 Atherosclerotic heart disease of native coronary artery without angina pectoris: Secondary | ICD-10-CM | POA: Diagnosis not present

## 2022-08-14 DIAGNOSIS — R404 Transient alteration of awareness: Secondary | ICD-10-CM | POA: Diagnosis not present

## 2022-08-14 DIAGNOSIS — N2581 Secondary hyperparathyroidism of renal origin: Secondary | ICD-10-CM | POA: Diagnosis not present

## 2022-08-14 DIAGNOSIS — D689 Coagulation defect, unspecified: Secondary | ICD-10-CM | POA: Diagnosis not present

## 2022-08-14 DIAGNOSIS — N189 Chronic kidney disease, unspecified: Secondary | ICD-10-CM | POA: Insufficient documentation

## 2022-08-14 DIAGNOSIS — I469 Cardiac arrest, cause unspecified: Secondary | ICD-10-CM

## 2022-08-14 MED ORDER — SODIUM BICARBONATE 8.4 % IV SOLN
INTRAVENOUS | Status: AC | PRN
  Administered 2022-08-14: 50 meq via INTRAVENOUS

## 2022-08-14 MED ORDER — CALCIUM CHLORIDE 10 % IV SOLN
INTRAVENOUS | Status: AC | PRN
  Administered 2022-08-14: 1 g via INTRAVENOUS

## 2022-08-20 ENCOUNTER — Telehealth: Payer: Self-pay | Admitting: Family Medicine

## 2022-08-20 NOTE — Telephone Encounter (Signed)
I completed this - please call them and let them know. I called daughter in law last week and didn't connect- I know she has a lot on plate but I'm happy to call back if she would like.

## 2022-08-20 NOTE — Telephone Encounter (Signed)
[employer] need update on death certificate for cremation for Mr Zion.  Please call [phone] should you have any questions.

## 2022-08-20 NOTE — Telephone Encounter (Signed)
FYI

## 2022-08-21 NOTE — Telephone Encounter (Signed)
Called and spoke with [facility] and below message given.

## 2022-09-11 NOTE — ED Triage Notes (Signed)
Witnessed cardiac arrest 1hr into dialysis treatment immediate CPR started,  Patients ICD fired 4 times prior to EMS arrival.  Initial rhythm PEA.  EMS got airway in place and lucas.  EMS gave 8 epi 450 amiodarone 1 bicarb and 1 calcium.  Ems also defib x 2.

## 2022-09-11 NOTE — ED Provider Notes (Signed)
[facility] [place] Provider Note   CSN: [id number] Arrival date & time: Sep 13, 2022  1322     History  Chief Complaint  Patient presents with   Cardiac Arrest    Rodney Farley is a 89 y.o. male.  Patient here with CPR in progress.  CPR been ongoing for about 45 minutes.  Epinephrine given 8 times, amiodarone given once.  Bicarb and calcium given.  Patient with history of CKD on dialysis.  About 1 hour into dialysis lost pulses.  He had a defibrillator that appeared to be firing but was not helping.  Seem to have PEA rhythm with EMS.  Supposedly was recently admitted to the hospital for respiratory distress.  History limited as patient with CPR in progress.  Level 5 caveat.  The history is provided by the EMS personnel.       Home Medications Prior to Admission medications   Medication Sig Start Date End Date Taking? Authorizing Provider  acetaminophen (TYLENOL) 500 MG tablet Take 500 mg by mouth as needed for moderate pain.    [provider]  Amino Acids-Protein Hydrolys (FEEDING SUPPLEMENT, PRO-STAT SUGAR FREE 64,) LIQD Take 30 mLs by mouth daily.    [provider]  ascorbic acid (VITAMIN C) 500 MG tablet Take 500 mg by mouth daily.    [provider]  aspirin EC 81 MG tablet Take 81 mg by mouth daily.    [provider]  atorvastatin (LIPITOR) 80 MG tablet Take 1 tablet by mouth daily. 11/14/21   Margie Billet, PA-C  B Complex-C-Folic Acid (NEPHRO VITAMINS) 0.8 MG TABS Take 1 tablet by mouth daily.    [provider]  bisacodyl (DULCOLAX) 10 MG suppository Place 10 mg rectally as needed for moderate constipation.    [provider]  calcitRIOL (ROCALTROL) 0.25 MCG capsule Take 7 capsules (1.75 mcg total) by mouth every Monday, Wednesday, and Friday with hemodialysis. 01/26/22   [name], Cain M, MD  carbamide peroxide (DEBROX) 6.5 % OTIC solution Place 5 drops into both ears 2  (two) times daily as needed (earwax). Patient taking differently: Place 5 drops into both ears as needed (earwax). 11/13/21   Lelon Perla, MD  carvedilol (COREG) 3.125 MG tablet Take 1 tablet (3.125 mg total) by mouth 2 (two) times daily with a meal. Hold for SBP <100 mmhg OR hr < 60/min Patient taking differently: Take 3.125 mg by mouth 2 (two) times daily with a meal. 05/08/22 07/27/22  Antonieta Pert, MD  docusate sodium (COLACE) 100 MG capsule Take 1 capsule (100 mg total) by mouth 2 (two) times daily. 06/09/22   Barb Merino, MD  Evolocumab (REPATHA SURECLICK) 097 MG/ML SOAJ Inject 140 mg into the skin every 14 (fourteen) days. 04/22/22   [name], Russel M, MD  ezetimibe (ZETIA) 10 MG tablet Take 1 tablet (10 mg total) by mouth daily. 05/08/22   Antonieta Pert, MD  gabapentin (NEURONTIN) 100 MG capsule Take 1 capsule (100 mg total) by mouth at bedtime. 06/09/22   Barb Merino, MD  glucose blood (FREESTYLE TEST STRIPS) test strip Use to check blood sugar daily 05/01/19   [name], MD  glucose monitoring kit (FREESTYLE) monitoring kit USE TO MONITOR BLOOD GLUCOSE AS DIRECTED 05/01/19   [name], MD  Infant Care Products [facility]) OINT Apply 1 Application topically in the morning, at noon, and at bedtime. Apply to sacrum for redness after incontinent episodes    [provider]  melatonin 3 MG TABS tablet Take 1 tablet (3 mg total) by mouth at bedtime. 06/09/22   Barb Merino, MD  midodrine (PROAMATINE) 10 MG tablet Take 0.5-1 tablets (5-10 mg total) by mouth See admin instructions. Take 1 tablet (10 mg) three times a week. Add 5 mg a night after dialysis as needed for low blood pressure. Patient taking differently: Take 10 mg by mouth 3 (three) times a week. 04/02/22   Aline August, MD  midodrine (PROAMATINE) 5 MG tablet Take 5 mg by mouth as needed (for low BP after dialysis days only (MWF)).    [provider]  Multiple Vitamins-Minerals (MULTIVITAMIN WITH  MINERALS) tablet Take 1 tablet by mouth daily.    [provider]  nitroGLYCERIN (NITROSTAT) 0.4 MG SL tablet Place 1 tablet under the tongue every 5 minutes x 3 doses as needed for chest pain. 11/13/21   Margie Billet, PA-C  oxymetazoline (AFRIN) 0.05 % nasal spray Place 1 spray into both nostrils 2 (two) times daily as needed for congestion. 12/29/21   [name], MD  pantoprazole (PROTONIX) 40 MG tablet Take 1 tablet by mouth daily. 11/14/21   Margie Billet, PA-C  polyethylene glycol (MIRALAX / GLYCOLAX) 17 g packet Take 17.5 g by mouth daily. Hold for loose stools    [provider]  saccharomyces boulardii (FLORASTOR) 250 MG capsule Take 1 capsule (250 mg total) by mouth 2 (two) times daily. 04/08/19   Regalado, Belkys A, MD  sevelamer carbonate (RENVELA) 800 MG tablet Take 2 tablets (1,600 mg total) by mouth 3 (three) times daily with meals. 11/13/21   Lelon Perla, MD  sorbitol 70 % solution Take 15 mLs by mouth every 8 (eight) hours as needed (constipation).    [provider]  ticagrelor (BRILINTA) 90 MG TABS tablet Take 1 tablet (90 mg total) by mouth 2 (two) times daily. 05/08/22   Antonieta Pert, MD  traZODone (DESYREL) 50 MG tablet Take 25 mg by mouth at bedtime as needed for sleep.    [provider]  vitamin B-12 (CYANOCOBALAMIN) 1000 MCG tablet Take 1,000 mcg by mouth daily.    [provider]  White Petrolatum-Mineral Oil (LUBRICANT EYE OP) Place 2 drops into both eyes in the morning, at noon, and at bedtime.    [provider]      Allergies    Bee venom, Lyrica [pregabalin], Prednisone, and Zocor [simvastatin]    Review of Systems   Review of Systems  Physical Exam Updated Vital Signs Ht 5\' 7"  (1.702 m)   Wt 88 kg   BMI 30.38 kg/m  Physical Exam Constitutional:      General: He is in acute distress.     Appearance: He is ill-appearing.  HENT:     Head: Normocephalic and atraumatic.     Mouth/Throat:      Mouth: Mucous membranes are moist.  Eyes:     Conjunctiva/sclera: Conjunctivae normal.  Cardiovascular:     Comments: No palpable carotid or femoral pulses bilaterally Abdominal:     General: There is no distension.  Musculoskeletal:        General: No deformity.     Cervical back: Neck supple.  Neurological:     GCS: GCS eye subscore is 1. GCS verbal subscore is 1. GCS motor subscore is 1.     ED Results / Procedures / Treatments   Labs (all labs ordered are listed, but only abnormal results are displayed) Labs Reviewed - No  data to display  EKG None  Radiology No results found.  Procedures .Critical Care  Performed by: Lennice Sites, DO Authorized by: Lennice Sites, DO   Critical care provider statement:    Critical care time (minutes):  35   Critical care was necessary to treat or prevent imminent or life-threatening deterioration of the following conditions:  Cardiac failure   Critical care was time spent personally by me on the following activities:  Evaluation of patient's response to treatment, examination of patient, obtaining history from patient or surrogate, pulse oximetry, review of old charts and ordering and performing treatments and interventions     Medications Ordered in ED Medications  calcium chloride injection (1 g Intravenous Given 09/08/2022 1321)  sodium bicarbonate injection (50 mEq Intravenous Given 08-Sep-2022 1322)    ED Course/ Medical Decision Making/ A&P                             Medical Decision Making Risk Prescription drug management.   Jaevon Paras is here with CPR in progress.  History of CKD on hemodialysis, CAD, cardiomyopathy status post defibrillator.  1 hour into dialysis patient went into cardiac arrest.  Defibrillator fired several times per EMS.  He had CPR for 45 minutes with no return of spontaneous circulation.  He has been given 8 rounds of epinephrine, given amiodarone.  PEA rhythm.  Overall upon arrival to  the ED patient has no pulses.  On organized rhythm on EKG strip.  Bedside echocardiogram showed no cardiac function.  He is ready been given calcium and bicarb to know improvement.  Per my chart review he is DNR DNI but I had already declared patient deceased at 54.  I updated the daughter-in-law who is the next of kin.  Patient's son has passed away recently.  He has a brother who is out of the state who daughter-in-law will get in touch with.  Patient will not be a medical examiner case given his multiple comorbidities.  Was recently admitted for volume overload, heart failure/respiratory failure.  Family updated.  This chart was dictated using voice recognition software.  Despite best efforts to proofread,  errors can occur which can change the documentation meaning.         Final Clinical Impression(s) / ED Diagnoses Final diagnoses:  Cardiac arrest [facility]    Rx / DC Orders ED Discharge Orders     None         Lennice Sites, DO 2022-09-08 1355

## 2022-09-11 NOTE — Progress Notes (Signed)
Responded to page to support pt. That reported to ED as CPR.  Pt later passed away.  Doctor, hospital.  Jaclynn Major, [place], [facility], Pager [phone]

## 2022-09-11 DEATH — deceased

## 2022-09-15 ENCOUNTER — Ambulatory Visit: Payer: PPO | Admitting: Physician Assistant

## 2022-09-17 ENCOUNTER — Ambulatory Visit: Payer: PPO | Admitting: Family Medicine
